# Patient Record
Sex: Female | Born: 1975 | Race: White | Hispanic: No | Marital: Single | Smoking: Never smoker
Health system: Southern US, Community
[De-identification: ages and names within clinical notes are randomized; demographics above are authoritative.]

## PROBLEM LIST (undated history)

## (undated) DIAGNOSIS — Z9989 Dependence on other enabling machines and devices: Secondary | ICD-10-CM

## (undated) DIAGNOSIS — R51 Headache: Secondary | ICD-10-CM

## (undated) DIAGNOSIS — B182 Chronic viral hepatitis C: Secondary | ICD-10-CM

## (undated) DIAGNOSIS — T7840XA Allergy, unspecified, initial encounter: Secondary | ICD-10-CM

## (undated) DIAGNOSIS — K589 Irritable bowel syndrome without diarrhea: Secondary | ICD-10-CM

## (undated) DIAGNOSIS — R519 Headache, unspecified: Secondary | ICD-10-CM

## (undated) DIAGNOSIS — F329 Major depressive disorder, single episode, unspecified: Secondary | ICD-10-CM

## (undated) DIAGNOSIS — F1020 Alcohol dependence, uncomplicated: Secondary | ICD-10-CM

## (undated) DIAGNOSIS — F419 Anxiety disorder, unspecified: Secondary | ICD-10-CM

## (undated) DIAGNOSIS — J45909 Unspecified asthma, uncomplicated: Secondary | ICD-10-CM

## (undated) DIAGNOSIS — G4733 Obstructive sleep apnea (adult) (pediatric): Secondary | ICD-10-CM

## (undated) DIAGNOSIS — R87619 Unspecified abnormal cytological findings in specimens from cervix uteri: Secondary | ICD-10-CM

## (undated) DIAGNOSIS — J302 Other seasonal allergic rhinitis: Secondary | ICD-10-CM

## (undated) DIAGNOSIS — IMO0002 Reserved for concepts with insufficient information to code with codable children: Secondary | ICD-10-CM

## (undated) DIAGNOSIS — F32A Depression, unspecified: Secondary | ICD-10-CM

## (undated) DIAGNOSIS — K921 Melena: Secondary | ICD-10-CM

## (undated) HISTORY — PX: TONSILLECTOMY: SUR1361

## (undated) HISTORY — DX: Chronic viral hepatitis C: B18.2

## (undated) HISTORY — DX: Melena: K92.1

## (undated) HISTORY — PX: DILATION AND CURETTAGE OF UTERUS: SHX78

## (undated) HISTORY — DX: Reserved for concepts with insufficient information to code with codable children: IMO0002

## (undated) HISTORY — DX: Obstructive sleep apnea (adult) (pediatric): G47.33

## (undated) HISTORY — PX: NASAL SEPTOPLASTY W/ TURBINOPLASTY: SHX2070

## (undated) HISTORY — DX: Allergy, unspecified, initial encounter: T78.40XA

## (undated) HISTORY — DX: Unspecified abnormal cytological findings in specimens from cervix uteri: R87.619

## (undated) HISTORY — DX: Headache: R51

## (undated) HISTORY — DX: Dependence on other enabling machines and devices: Z99.89

## (undated) HISTORY — DX: Alcohol dependence, uncomplicated: F10.20

## (undated) HISTORY — DX: Other seasonal allergic rhinitis: J30.2

## (undated) HISTORY — DX: Irritable bowel syndrome, unspecified: K58.9

## (undated) HISTORY — DX: Headache, unspecified: R51.9

---

## 1997-05-15 HISTORY — PX: NASAL FRACTURE SURGERY: SHX718

## 1998-12-28 ENCOUNTER — Emergency Department (HOSPITAL_COMMUNITY): Admission: EM | Admit: 1998-12-28 | Discharge: 1998-12-28 | Payer: Self-pay | Admitting: Emergency Medicine

## 1999-03-04 ENCOUNTER — Emergency Department (HOSPITAL_COMMUNITY): Admission: EM | Admit: 1999-03-04 | Discharge: 1999-03-04 | Payer: Self-pay | Admitting: Emergency Medicine

## 1999-03-04 ENCOUNTER — Encounter: Payer: Self-pay | Admitting: Emergency Medicine

## 2003-11-21 ENCOUNTER — Emergency Department (HOSPITAL_COMMUNITY): Admission: EM | Admit: 2003-11-21 | Discharge: 2003-11-21 | Payer: Self-pay | Admitting: Emergency Medicine

## 2004-07-14 ENCOUNTER — Ambulatory Visit: Payer: Self-pay | Admitting: Internal Medicine

## 2004-08-02 ENCOUNTER — Ambulatory Visit (HOSPITAL_COMMUNITY): Admission: RE | Admit: 2004-08-02 | Discharge: 2004-08-02 | Payer: Self-pay | Admitting: Internal Medicine

## 2004-08-18 ENCOUNTER — Ambulatory Visit: Payer: Self-pay | Admitting: Gastroenterology

## 2004-09-05 ENCOUNTER — Encounter (INDEPENDENT_AMBULATORY_CARE_PROVIDER_SITE_OTHER): Payer: Self-pay | Admitting: *Deleted

## 2004-09-05 ENCOUNTER — Ambulatory Visit (HOSPITAL_COMMUNITY): Admission: RE | Admit: 2004-09-05 | Discharge: 2004-09-05 | Payer: Self-pay | Admitting: Obstetrics and Gynecology

## 2004-12-23 ENCOUNTER — Ambulatory Visit: Payer: Self-pay | Admitting: Gastroenterology

## 2005-03-02 ENCOUNTER — Ambulatory Visit: Payer: Self-pay | Admitting: Gastroenterology

## 2005-03-09 ENCOUNTER — Ambulatory Visit: Payer: Self-pay | Admitting: Family Medicine

## 2005-03-28 ENCOUNTER — Other Ambulatory Visit: Admission: RE | Admit: 2005-03-28 | Discharge: 2005-03-28 | Payer: Self-pay | Admitting: Family Medicine

## 2005-03-28 ENCOUNTER — Encounter (INDEPENDENT_AMBULATORY_CARE_PROVIDER_SITE_OTHER): Payer: Self-pay | Admitting: Specialist

## 2005-03-28 ENCOUNTER — Ambulatory Visit: Payer: Self-pay | Admitting: Family Medicine

## 2005-04-04 ENCOUNTER — Ambulatory Visit: Payer: Self-pay | Admitting: Family Medicine

## 2005-05-03 ENCOUNTER — Emergency Department (HOSPITAL_COMMUNITY): Admission: EM | Admit: 2005-05-03 | Discharge: 2005-05-04 | Payer: Self-pay | Admitting: Emergency Medicine

## 2005-07-10 ENCOUNTER — Ambulatory Visit: Payer: Self-pay | Admitting: Family Medicine

## 2005-08-22 ENCOUNTER — Ambulatory Visit: Payer: Self-pay | Admitting: Family Medicine

## 2005-09-15 ENCOUNTER — Ambulatory Visit: Payer: Self-pay | Admitting: Gastroenterology

## 2006-01-25 ENCOUNTER — Ambulatory Visit: Payer: Self-pay | Admitting: Gastroenterology

## 2006-04-24 ENCOUNTER — Ambulatory Visit: Payer: Self-pay | Admitting: Gastroenterology

## 2006-05-15 HISTORY — PX: LEEP: SHX91

## 2006-08-02 ENCOUNTER — Ambulatory Visit: Payer: Self-pay | Admitting: Family Medicine

## 2006-10-19 DIAGNOSIS — J45909 Unspecified asthma, uncomplicated: Secondary | ICD-10-CM | POA: Insufficient documentation

## 2006-10-24 ENCOUNTER — Ambulatory Visit: Payer: Self-pay | Admitting: Family Medicine

## 2006-10-24 DIAGNOSIS — F3160 Bipolar disorder, current episode mixed, unspecified: Secondary | ICD-10-CM | POA: Insufficient documentation

## 2006-10-24 DIAGNOSIS — B182 Chronic viral hepatitis C: Secondary | ICD-10-CM | POA: Insufficient documentation

## 2006-10-24 DIAGNOSIS — Z9189 Other specified personal risk factors, not elsewhere classified: Secondary | ICD-10-CM | POA: Insufficient documentation

## 2006-10-24 DIAGNOSIS — O0380 Unspecified complication following complete or unspecified spontaneous abortion: Secondary | ICD-10-CM | POA: Insufficient documentation

## 2006-10-26 LAB — CONVERTED CEMR LAB
ALT: 205 units/L — ABNORMAL HIGH (ref 0–40)
Albumin: 3.3 g/dL — ABNORMAL LOW (ref 3.5–5.2)
Alkaline Phosphatase: 69 units/L (ref 39–117)
Basophils Absolute: 0 10*3/uL (ref 0.0–0.1)
Basophils Relative: 0.3 % (ref 0.0–1.0)
Chloride: 103 meq/L (ref 96–112)
Creatinine, Ser: 0.9 mg/dL (ref 0.4–1.2)
Eosinophils Absolute: 0.1 10*3/uL (ref 0.0–0.6)
GGT: 117 units/L — ABNORMAL HIGH (ref 7–51)
HCT: 37.9 % (ref 36.0–46.0)
HDL: 38.4 mg/dL — ABNORMAL LOW (ref >39.0)
Hemoglobin: 13.2 g/dL (ref 12.0–15.0)
Lymphocytes Relative: 44.7 % (ref 12.0–46.0)
Neutro Abs: 1.9 10*3/uL (ref 1.4–7.7)
Neutrophils Relative %: 43.6 % (ref 43.0–77.0)
Potassium: 3.8 meq/L (ref 3.5–5.1)
RDW: 12.8 % (ref 11.5–14.6)
Total Protein: 7.1 g/dL (ref 6.0–8.3)
Triglycerides: 116 mg/dL (ref 0–149)
VLDL: 23 mg/dL (ref 0–40)
WBC: 4.3 10*3/uL — ABNORMAL LOW (ref 4.5–10.5)

## 2006-11-13 ENCOUNTER — Ambulatory Visit: Payer: Self-pay | Admitting: Gastroenterology

## 2006-12-24 ENCOUNTER — Ambulatory Visit: Payer: Self-pay | Admitting: Family Medicine

## 2007-02-25 ENCOUNTER — Ambulatory Visit: Payer: Self-pay | Admitting: Internal Medicine

## 2007-03-05 ENCOUNTER — Encounter: Payer: Self-pay | Admitting: Internal Medicine

## 2007-03-05 ENCOUNTER — Encounter: Payer: Self-pay | Admitting: Family Medicine

## 2007-03-05 ENCOUNTER — Ambulatory Visit (HOSPITAL_COMMUNITY): Admission: RE | Admit: 2007-03-05 | Discharge: 2007-03-05 | Payer: Self-pay | Admitting: Internal Medicine

## 2007-03-18 ENCOUNTER — Ambulatory Visit: Payer: Self-pay | Admitting: Internal Medicine

## 2007-05-29 ENCOUNTER — Ambulatory Visit: Payer: Self-pay | Admitting: Internal Medicine

## 2007-06-06 ENCOUNTER — Ambulatory Visit: Payer: Self-pay | Admitting: Gastroenterology

## 2007-06-25 ENCOUNTER — Encounter: Payer: Self-pay | Admitting: Family Medicine

## 2007-07-23 DIAGNOSIS — F1021 Alcohol dependence, in remission: Secondary | ICD-10-CM

## 2007-07-23 DIAGNOSIS — F1921 Other psychoactive substance dependence, in remission: Secondary | ICD-10-CM | POA: Insufficient documentation

## 2007-07-23 DIAGNOSIS — K2940 Chronic atrophic gastritis without bleeding: Secondary | ICD-10-CM | POA: Insufficient documentation

## 2007-07-23 DIAGNOSIS — F329 Major depressive disorder, single episode, unspecified: Secondary | ICD-10-CM

## 2008-06-30 ENCOUNTER — Ambulatory Visit: Payer: Self-pay | Admitting: Gastroenterology

## 2010-03-11 ENCOUNTER — Encounter: Admission: RE | Admit: 2010-03-11 | Discharge: 2010-03-11 | Payer: Self-pay | Admitting: Obstetrics and Gynecology

## 2010-04-14 ENCOUNTER — Ambulatory Visit: Payer: Self-pay | Admitting: Gastroenterology

## 2010-04-27 ENCOUNTER — Encounter
Admission: RE | Admit: 2010-04-27 | Discharge: 2010-04-27 | Payer: Self-pay | Source: Home / Self Care | Attending: Obstetrics and Gynecology | Admitting: Obstetrics and Gynecology

## 2010-09-11 ENCOUNTER — Emergency Department (HOSPITAL_COMMUNITY)
Admission: EM | Admit: 2010-09-11 | Discharge: 2010-09-11 | Disposition: A | Discharge status: Home / Self Care | Payer: BC Managed Care – PPO | Attending: Emergency Medicine | Admitting: Emergency Medicine

## 2010-09-11 DIAGNOSIS — R42 Dizziness and giddiness: Secondary | ICD-10-CM | POA: Insufficient documentation

## 2010-09-11 DIAGNOSIS — J329 Chronic sinusitis, unspecified: Secondary | ICD-10-CM | POA: Insufficient documentation

## 2010-09-11 DIAGNOSIS — F319 Bipolar disorder, unspecified: Secondary | ICD-10-CM | POA: Insufficient documentation

## 2010-09-11 DIAGNOSIS — R51 Headache: Secondary | ICD-10-CM | POA: Insufficient documentation

## 2010-09-11 DIAGNOSIS — B1920 Unspecified viral hepatitis C without hepatic coma: Secondary | ICD-10-CM | POA: Insufficient documentation

## 2010-09-11 DIAGNOSIS — R112 Nausea with vomiting, unspecified: Secondary | ICD-10-CM | POA: Insufficient documentation

## 2010-09-11 LAB — URINALYSIS, ROUTINE W REFLEX MICROSCOPIC
Bilirubin Urine: NEGATIVE
Glucose, UA: NEGATIVE mg/dL
Hgb urine dipstick: NEGATIVE

## 2010-09-27 NOTE — Assessment & Plan Note (Signed)
[facility]                         GASTROENTEROLOGY OFFICE NOTE   Nicole [name], Nicole [name]                       MRN:          [id number]  DATE:02/25/2007                            DOB:          [birth year]    [name] is a very nice 35 year old white female who is here today  because of intermittent nausea and vomiting which has been going on for  11 months.   She describes vomiting in the morning or in the evening and at times  during the day.  She may vomit post prandially but also may just gag and  vomit just a small amount of liquid.  The vomiting does not necessarily  improve her symptoms.  In fact, she is usually sick even after she  vomits.  She has missed work only rarely.  She usually goes to work and  the vomiting does not keep her from interrupting her work.  She never  vomits at night.  There is no abdominal pain associated with it.  There  is no history of prior nausea and vomiting prior to 11 months ago.  There is no family history of similar problems.  But, there is a very  strong family history of gallbladder disease in her maternal grandmother  as well as in her mother who has gallstones.   Nicole [name] had an upper abdominal ultrasound in 2006, which showed a  completely normal gallbladder and common bile duct.   Nicole [name] denies any change in her bowel habits.  She has a history of  hepatitis C which was diagnosed on liver biopsy in April 2006.  She had  grade 1 fibrosis and grade 2 activity.  This was done after use of  intravenous drugs.  She went into recovery in 2003 and relapsed in  February 2007.  She went to rehab and has been clean for over a year.  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   Her weight has been stable.  She denies any rashes, fevers, chills.   MEDICATIONS:  1. Kariva birth control pills.  2. Abilify 10 mg p.o. q.h.s.  3. Wellbutrin 150 mg, one p.o. b.i.d. for  bipolar disorder, followed      by Dr. Evelene Croon.  She sees her every 6 months.    She goes also to the hepatitis clinic every 6 months.  She is not being  treated with interferon because she could not afford the medication.   PAST HISTORY:  Significant for:  1. Hepatitis C.  2. Depression.  3. Allergies.  4. She has also a history of alcoholism.   FAMILY HISTORY:  Positive for hepatitis C in the father who also has  alcoholism.   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  She does  not smoke.  She does not drink alcohol currently.   REVIEW OF SYSTEMS:  Positive for eye glasses.   PHYSICAL EXAMINATION:  VITAL SIGNS:  Blood pressure 98/52, weight 132  pounds, pulse 88.  GENERAL:  She was alert, oriented, no distress,  very quiet.  EYES:  Sclerae nonicteric.  EXTREMITIES:  There was palmar erythema on both hands.  There were no  spider nevi.  NECK:  Supple.  No adenopathy.  LUNGS:  Clear to auscultation.  COR:  Normal S1, normal S2.  ABDOMEN:  Soft with minimal tenderness right upper quadrant.  Her liver  edge was at the costal margin. Splenic __________  .  There was no  distention.  No ascites.  Abdomen was normal.  RECTAL:  Not done.  EXTREMITIES:  No edema.   IMPRESSION:  27. A 35 year old white female with active hepatitis C - not treated      with a grade 1 fibrosis and a grade 2 activity per liver biopsy in      April 2006.  2. Nausea and vomiting twice a week, rule out gastroparesis, rule out      gastritis, rule out medication side effects or possibly functional      nausea and vomiting.  3. History of intravenous drug abuse with relapse in 2007, currently      in remission.  4. Positive family history of gallbladder disease in mother and      grandmother.   PLAN:  1. Upper endoscopy scheduled.  2. Trial of Reglan 5 mg p.o. b.i.d.  3. Pepcid 4 mg q.h.s.  4. Communicate with Dr. Evelene Croon concerning the patient's symptoms and      their possible  relationship to her bipolar disorder.  5. We may also consider a gastric emptying scan or increasing her      Reglan to 10 mg at a time.     Hedwig Morton. Juanda Chance, MD  Electronically Signed    DMB/MedQ  DD: 02/25/2007  DT: 02/25/2007  Job #: [id number]   cc:   Lelon Perla, DO  Milagros Evener, M.D.

## 2010-09-27 NOTE — Assessment & Plan Note (Signed)
[facility]                         GASTROENTEROLOGY OFFICE NOTE   [name], [name]                       MRN:          [id number]  DATE:05/29/2007                            DOB:          December 01, 1975    Nicole [name] is a 35 year old white female with bipolar disorder who we saw for  evaluation of nausea and vomiting.  Upper endoscopy on March 05, 2007  was essentially normal except for mild chronic gastritis.  She was  treated with Pepcid, but discontinued it because of no effect.  She also  tried metoclopramide 5 mg twice a day which seemed to have helped, but  she also discontinued it.  We have attributed her nausea to Abilify  medication 10 mg at bedtime.  The dose was adjusted, but she started  developing symptoms of bipolar disorder, and the dose had to be  increased again.  While she reduced the dose of her Abilify, her nausea  markedly improved.  Overall, her nausea has improved to the point where  she has it only about once every 2 weeks.  She takes Phenergan 12.5 mg  p.r.n. nausea which is quite effective and she is satisfied with the  results and effects of the Phenergan.  Another separate problem has been  hepatitis C.  She had a liver biopsy in 2006.  We have not been asked to  follow this problem.   Today we discussed treatment of her nausea related to the medication,  but it is not bothersome to her.  She needs to continue taking her  Abilify to control the symptoms of bipolar disorder.  She used to be  followed by Dr. Evelene Croon.   PLAN:  1. Take Reglan 5 mg b.i.d. p.r.n.  2. Minimize use of ibuprofen which she takes for headaches.  3. Take Gas-X or probiotics for gas.  4. Refills of Phenergan in case she needs it.  Currently, she has      enough to last her for a while.  I will see her on a p.r.n. basis.     Nicole [name]. Juanda Chance, MD  Electronically Signed    DMB/MedQ  DD: 05/29/2007  DT: 05/29/2007  Job #: [id number]   cc:   Lelon Perla,  DO

## 2010-09-29 ENCOUNTER — Ambulatory Visit: Payer: BC Managed Care – PPO | Admitting: Gastroenterology

## 2010-09-29 ENCOUNTER — Encounter: Payer: Self-pay | Admitting: Gastroenterology

## 2010-09-29 ENCOUNTER — Ambulatory Visit (INDEPENDENT_AMBULATORY_CARE_PROVIDER_SITE_OTHER): Payer: BC Managed Care – PPO | Admitting: Gastroenterology

## 2010-09-29 VITALS — BP 102/73 | HR 86 | Temp 98.4°F | Ht 61.0 in | Wt 157.0 lb

## 2010-09-29 DIAGNOSIS — B182 Chronic viral hepatitis C: Secondary | ICD-10-CM

## 2010-09-29 NOTE — Progress Notes (Signed)
Reason for visit:  Followup with genotype 1a HCV.  History:  [name] returns then accompanied. She has genotype 1a HCV with a previous biopsy on 09/05/2004 showing grade 2, stage I disease. She has not been treated because of stability disease and minor fibrosis. After last being seen by me on 04/14/2010, she was screened for [id number], but failed because of her psychiatric history.   Currently [name] has no symptoms referable to her history of liver disease. There are no symptoms to suggest cryoglobulinemia or decompensated liver disease.  Past medical history:  No interval change. In terms of her bipolar affective disorder [name] sees Dr. Evelene Croon every 6 months. She does not have an ongoing relationship with a therapist between those visits. When asked about this., [name] reports that Dr. Evelene Croon did not think a therapist was necessary.  Medications:  Outpatient Encounter Prescriptions as of 09/29/2010  Medication Sig Dispense Refill  . albuterol (PROVENTIL) (2.5 MG/3ML) 0.083% nebulizer solution Take by nebulization as needed.        . ARIPiprazole (ABILIFY) 15 MG tablet Take 15 mg by mouth daily.        Marland Kitchen buPROPion (WELLBUTRIN SR) 150 MG 12 hr tablet Take 150 mg by mouth 2 (two) times daily.        . Desogestrel-Ethinyl Estradiol (KARIVA PO) Take 1 tablet by mouth daily.        . fluticasone (FLONASE) 50 MCG/ACT nasal spray 2 sprays by Nasal route 2 (two) times daily as needed.        Marland Kitchen ibuprofen (ADVIL,MOTRIN) 100 MG tablet Take 100 mg by mouth as needed.         Allergies  Allergen Reactions  . Cefaclor Rash   Smoking: Quit 2007  Alcohol: Denies interval consumption.  Review of systems: [name] reports a recent episode of sinus headaches for which she needed to attend her emergency room local emergency room for relief. Otherwise all consistent reviewed today with [name] a negative other which is mentioned above. Her CES D. was 5.  Physical  examination:  Constitutional: Appeared well.  Vital Signs: BP 102/73  Pulse 86  Temp 98.4 F (36.9 C)  Ht 5\' 1"  (1.549 m)  Wt 157 lb (71.215 kg)  BMI 29.67 kg/m2  Prior lab work from her screening visit on 08/17/2010:  Her IL28 B was CC. Her hepatitis B surface antigen was negative. HIV was negative. A drug screen was negative. TSH 2.86.  Hemoglobin A1c 5.3%.  AST and ALT could not be determined because of hemolysis. ALP was 44. Total bilirubin 0.5.  Creatinine 1.12. Albumin 3.7. Globulins 4.1. CBC white count 6.44 hemoglobin 14.4 platelets 248. Beta hCG negative. HCVRNA 6430000 international units per mL.  Genotype 1a.  Assessment.  [name] is a 35 year old with genotype 1A HCV genotype IL28B CC and biopsy on 09/05/2004 showing grade 2, stage I disease. She has not been treated. As previously I think she is a good candidate for treatment despite her bipolar disorder. She demonstrates excellent insight into her condition. She reports a stable relationship with her psychiatrist.  In my discussion with [name] we discussed the possibility waiting for another trial to open up or the availability of future medications versus starting on treatment with a combination of pegylated interferon, ribavirin, and a protease inhibitor  now. I reviewed the specific system, constitutional, and psychiatric side effects treatment. We discussed the treatment protocol for clinic. If she is to start treatment I would arrange  for medication to be shipped to her and she was then to contact us for a followup appointment in clinic where she will shown how to self-administer her meds and a treatment regimen will be arranged. Having discussed this with [name], she would like to start on treatment.  Plan:  #1 I will complete the necessary paperwork for a prescription for Pegasys 180 mcg weekly, ribavirin 1000 mg by mouth daily, and to severe 750 mg 3 times a day.  #2 Has received appetite A and B  vaccination previously.  #3 Her followup will be determined based on when she receives medications and comes in for teaching.

## 2010-11-10 ENCOUNTER — Other Ambulatory Visit: Payer: Self-pay | Admitting: Gastroenterology

## 2010-11-10 ENCOUNTER — Ambulatory Visit (INDEPENDENT_AMBULATORY_CARE_PROVIDER_SITE_OTHER): Payer: BC Managed Care – PPO | Admitting: Gastroenterology

## 2010-11-10 VITALS — BP 110/78 | HR 82 | Temp 98.4°F | Ht 61.0 in | Wt 158.0 lb

## 2010-11-10 DIAGNOSIS — B182 Chronic viral hepatitis C: Secondary | ICD-10-CM

## 2010-11-11 LAB — HEPATITIS C RNA QUANTITATIVE: HCV Quantitative: 2870000 IU/mL — ABNORMAL HIGH (ref <43)

## 2010-11-17 NOTE — Progress Notes (Signed)
NAME:  Nicole [name], Nicole [name]  MR#:  [id number]      DATE:  11/10/2010  DOB:  02-18-1976    cc: Consulting Physician:  Elmon Else, MD, [facility], [address] [place], [address], [place], [place] [address], Primary care physician:  [name], MD, [facility], [address], [place], [place] [address], Phone [phone] Referring Physician:  Dorena Cookey, MD, [facility], [address], [address], [place], [place] [address], Fax [phone] Milagros Evener, MD [facility], [address] [address], [place], [place] [address], Phone [phone]    REASON FOR VISIT:  Followup of genotype 1a hepatitis C, IL 28B genotype CC.   History:  The patient returns today unaccompanied. She has no symptoms currently referable to her history of hepatitis C. There are no symptoms to suggest cryoglobulin mediated or decompensated liver disease. She  is most interested in starting on therapy. I have gone through the process of prior authorization for her medications. [facility], the specialty pharmacy that will be supplying her drugs would not release  drug until there is a negative urine pregnancy test, and a teaching visit for which she also comes in today.   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 She also has a history of bipolar affective disorder, which is very stable under the care of Dr. Evelene Croon.    CURRENT MEDICATIONS:  Abilify 15 mg p.o. at bedtime, Wellbutrin 300 mg p.o. daily, Kariva once daily, albuterol inhaler p.r.n., Flonase 2 inhalations daily, and ibuprofen p.r.n.   ALLERGIES:  Ceclor causes a rash.    habits:  Smoking, quit over 5 years ago. Alcohol denies interval consumption.   REVIEW OF SYSTEMS:  All 10 systems reviewed today with the patient and they are negative other than which was mentioned above. His CES-D was 7.   PHYSICAL EXAMINATION:    Constitutional: Well appearing. Vital signs: Height 61 inches, weight 158 pounds, blood pressure 110/78, pulse of 82, temperature 98.4 Fahrenheit.   Laboratories:  Urine pregnancy test on 09/12/2010, was negative.   ASSESSMENT:  The patient is a 35 year old woman with a history genotype 1a HCV, IL 28B CC, liver biopsy on 09/05/2000, showing grade 2 stage 1 disease who is naive to treatment because of previous concerns of her bipolar  affective disorder. However it appears these issues have been resolved, and so she is brought in today for teaching.  Today, I have explained to her how to use the Pegasys preloaded syringes including if she requires a dose modification. We also reviewed the telaprevir booklet with all instructions and discussion  of side effects, as well. The patient's questions were answered. I have also taken this opportunity to explain to her that she needs to engage in 2 forms of contraception for duration of treatment and for 6  months thereafter because of the teratogenicity of the medications. She indicates that she has discussed with her gynecologist as to  whether use an IUD or 2 methods of contraception with her significant other and will resolve this soon because she knows she cannot become pregnant on therapy and for six months thereafter.   plan:  1. The patient will have a urine pregnancy test today. 2. Will do a viral load today. 3. These results will be sent to [facility] thereafter will release medications if the results are appropriate.  4. The patient knows to contact us once she receives medications to  give [place] notification as to when she needs her next appointment, which will be within 2 weeks of starting on treatment. 5. Previously hepatitis A and B immune. 6. I filled out her FMLA form to account for absence from work for clinic visit appointments, which she knows are very important. 7. I have told her that once she is on therapy, and has her first 2 week  follow up visit on treatment, we will need t do weekly CBC through [facility].             Brooke Dare, MD   ADDENDUM Urine pregnancy negative.  Viral load  2 870 000 IU/mL.  403 .[id number]  D:  Thu Jun 28 17:49:23 2012 ; T:  Thu Jun 28 20:55:36 2012  Job #:  [id number]

## 2010-12-08 ENCOUNTER — Telehealth: Payer: Self-pay | Admitting: Gastroenterology

## 2010-12-08 NOTE — Telephone Encounter (Signed)
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  Her start date for treatment was 12/02/10.  1) In terms of her concern that the symptoms are caused by unabsorbed telaprevir and if she is not absorbing it all, the lack of a good blood level would compromise her chance at an SVR, I reassured her that this would have a negligible effect on treatment outcomes.  She also is adhering to the pre dose fat meal, which I reassured her will help improve the bioavailability.                                                                                                                                                                                                                                                                                                                                                                                                                                                                                                                      2) In terms of her  concern about symptom relief, the [facility] pharmacist gave her a handout on symptom management for the proctalgia, Nicole [name] is starting to follow the instructions with some improvement.  She was reassured and will return next week for her 2 week on therapy appt.

## 2010-12-15 ENCOUNTER — Ambulatory Visit (INDEPENDENT_AMBULATORY_CARE_PROVIDER_SITE_OTHER): Payer: BC Managed Care – PPO | Admitting: Gastroenterology

## 2010-12-15 ENCOUNTER — Other Ambulatory Visit: Payer: Self-pay | Admitting: Gastroenterology

## 2010-12-15 DIAGNOSIS — B182 Chronic viral hepatitis C: Secondary | ICD-10-CM

## 2010-12-15 LAB — CBC WITH DIFFERENTIAL/PLATELET
Eosinophils Absolute: 0 10*3/uL (ref 0.0–0.7)
HCT: 41.2 % (ref 36.0–46.0)
Hemoglobin: 13.7 g/dL (ref 12.0–15.0)
Lymphocytes Relative: 58 % — ABNORMAL HIGH (ref 12–46)
MCH: 31.7 pg (ref 26.0–34.0)
MCHC: 33.3 g/dL (ref 30.0–36.0)
Neutrophils Relative %: 30 % — ABNORMAL LOW (ref 43–77)
Platelets: 159 10*3/uL (ref 150–400)
RBC: 4.32 MIL/uL (ref 3.87–5.11)
RDW: 14.4 % (ref 11.5–15.5)

## 2010-12-22 ENCOUNTER — Other Ambulatory Visit: Payer: Self-pay | Admitting: Gastroenterology

## 2010-12-22 NOTE — Progress Notes (Signed)
NAME:  Nicole [name], Nicole [name]  MR#:  [id number]      DATE:  12/15/2010  DOB:  March 23, 1976    cc: Consulting Physician:  Elmon Else, MD, [facility], [address] [place], [address], [place], [place] [address], Primary Care Physician:  [name], MD, [facility], [address], [address], [place], [place] [address], Phone [phone] Referring Physician:  Veva Holes, MD, [facility], [address], [place], [place] [address], Fax [phone] Milagros Evener, MD, [facility], [address] [address], [place], [place] [address], Phone [phone]    REASON FOR VISIT:  Followup genotype 1a hepatitis C, IL 28B genotype CC, week 2 of therapy.   history:  The patient returns today unaccompanied. She is a 35 year old woman with history of genotype 1a hepatitis C, IL 28B CC, with a biopsy on 09/05/2000 showing grade 2 stage I disease who is naive to treatment.  Her start date for therapy was 12/02/2010. She is due for her third injection Pegasys tomorrow. The patient is on a combination of Pegasys, ribavirin, and telaprevir. Since starting therapy, she has  some myalgias and chills especially after injections but it is not that significant for her. In terms of her history of bipolar disorder, she denies any depressive symptoms. In fact, she feels better now that  she is on treatment than before. In addition, she has fatigue. There is no skin rash. 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 The patient is using condoms and spermicidal foam as contraception.   PAST MEDICAL HISTORY:  There is no other interval change.   CURRENT MEDICATIONS:  Abilify 15 mg p.o. at bedtime, Wellbutrin 300 mg p.o. daily, Kariva once daily, albuterol inhaler p.r.n., ibuprofen p.r.n., Pegasys 180  mcg subcu weekly on Fridays, ribavirin 600 mg p.o. b.i.d., telaprevir 750 mg p.o. q. 8 hours.    ALLERGIES:  Ceclor causes a rash.   HABITS:  Smoking, quit over 5 years ago. Alcohol, denies interval consumption.   REVIEW OF SYSTEMS:  All 10 systems reviewed today with the patient and they are negative other than which is mentioned above. CES-D was 9.   PHYSICAL EXAMINATION:   Constitutional:  Well appearing with some degree of central obesity. Vital signs: Height 61 inches, weight 160 pounds, blood pressure 131/89, pulse 97, temperature 98.2 Fahrenheit.   LABORATORY WORK:  Today is pending.   ASSESSMENT:  The patient is a 35 year old woman with history genotype 1a hepatitis C, IL 28B CC, liver biopsy 09/05/2000 showing grade 2 stage I disease. She is naive to therapy and started on Pegasys, ribavirin, and  telaprevir on 12/02/2010 putting her at week 2 of therapy. She is tolerating therapy well with the usual side effects of interferon and telaprevir.  Today we discussed side effect management. We also discussed starting a weekly CBC starting today. I have given her a standing order for CBC  with differential to be done on weeks that she is not in clinic to see me. We discussed the viral load milestones and their significance.   plan:  1. CBC with differential today. 2. Hepatitis A and B immune. 3. She is using 2 methods of contraception. 4. I have given her a standing order for CBC with differential on a weekly basis, which she will start next week on Thursdays. 5. She will return in 2 weeks' time for her week 4 visit at which time  will do her first HCV RNA. 6. She knows to contact [facility] for renewals of medications before she runs out.            Brooke Dare, MD   [phone]  D:  Thu Aug 02 13:01:34 2012 ; T:  Thu Aug 02 13:34:59 2012  Job #:  [id number]

## 2010-12-23 LAB — CBC WITH DIFFERENTIAL/PLATELET
Basophils Absolute: 0 10*3/uL (ref 0.0–0.1)
Eosinophils Absolute: 0 10*3/uL (ref 0.0–0.7)
Eosinophils Relative: 0 % (ref 0–5)
HCT: 39.1 % (ref 36.0–46.0)
Hemoglobin: 12.6 g/dL (ref 12.0–15.0)
MCHC: 32.2 g/dL (ref 30.0–36.0)
MCV: 96.5 fL (ref 78.0–100.0)
Monocytes Absolute: 0.4 10*3/uL (ref 0.1–1.0)
Neutro Abs: 1 10*3/uL — ABNORMAL LOW (ref 1.7–7.7)
WBC: 4.2 10*3/uL (ref 4.0–10.5)

## 2010-12-29 ENCOUNTER — Other Ambulatory Visit: Payer: Self-pay | Admitting: Gastroenterology

## 2010-12-29 ENCOUNTER — Ambulatory Visit (INDEPENDENT_AMBULATORY_CARE_PROVIDER_SITE_OTHER): Payer: BC Managed Care – PPO | Admitting: Gastroenterology

## 2010-12-29 VITALS — BP 118/89 | HR 96 | Temp 97.8°F | Ht 61.0 in | Wt 161.0 lb

## 2010-12-29 DIAGNOSIS — B182 Chronic viral hepatitis C: Secondary | ICD-10-CM

## 2010-12-30 LAB — CBC WITH DIFFERENTIAL/PLATELET
Eosinophils Relative: 0 % (ref 0–5)
MCH: 30.9 pg (ref 26.0–34.0)
MCHC: 31 g/dL (ref 30.0–36.0)
Monocytes Absolute: 0.3 10*3/uL (ref 0.1–1.0)
Neutro Abs: 1.6 10*3/uL — ABNORMAL LOW (ref 1.7–7.7)
Neutrophils Relative %: 39 % — ABNORMAL LOW (ref 43–77)
WBC: 4.1 10*3/uL (ref 4.0–10.5)

## 2011-01-02 LAB — HEPATITIS C RNA QUANTITATIVE

## 2011-01-05 ENCOUNTER — Other Ambulatory Visit: Payer: Self-pay | Admitting: Gastroenterology

## 2011-01-05 NOTE — Progress Notes (Signed)
NAME:  Nimrat, Woolworth  MR#:  [id number]      DATE:  12/29/2010  DOB:  11-18-1975    cc: Vinetta Bergamo. Richardson Dopp, PhD, [address], [address], [place], [place] [address], Phone [phone] Consulting Physician:  Elmon Else, MD, [facility], [address], [address], [place], [place] [address] Primary Care Physician:  [name], MD, [facility], [address], [address], [place], [place] [address],  Phone [phone] Referring Physician:  Dorena Cookey, MD, [facility], [address], [place], [place] [address], Fax [phone] Milagros Evener, MD, [facility], [address] [address], [place], [place] [address], Phone [phone]    REASON FOR VISIT:  Followup genotype 1a hepatitis C, IL 28B genotype CC, week 4 of therapy.   History:  The patient returns today unaccompanied. She has complained of significant fatigue. In addition, she noticed that she has become amenorrheic. Otherwise, she denies any significant side effects of  therapy, nor are there symptoms to suggest decompensated liver disease.  It should be noted she has a history of bipolar affective disorder. She continues to followup with Dr. Evelene Croon for medication management. In addition, she is now seeing a Evalee Jefferson a psychologist in  [place]. She has seen her 3 times and is due to see her biweekly. Overall, the patient is feeling well and would like to continue with treatment.   PAST MEDICAL HISTORY:  There is no other interval change.   CURRENT MEDICATIONS:  Abilify 15 mg p.o. at bedtime, Wellbutrin 50 mg p.o. once daily, Kariva once daily, albuterol inhaler p.r.n., ibuprofen p.r.n., Pegasys  180 mcg subcu weekly, Fridays, ribavirin 600 mg p.o. b.i.d., telaprevir 750 mg p.o. every 8 hours.   ALLERGIES:  Ceclor causes a rash.   HABITS:  Smoking quit over 5 years ago. Alcohol denies interval consumption.   REVIEW OF SYSTEMS:  All  10 systems reviewed today with the patient and they are negative other than which was mentioned above. CES-D was 13.   Physical examination:   Constitutional:  Well appearing with some element of central obesity.   Vital signs:  Height is 61 inches, weight 161 pounds, unchanged from previously. Blood pressure 118/89, pulse of 96, temperature 97 Fahrenheit.    Laboratories:  Last week CBC, white count 4.2, hemoglobin 12.6, MCV 96.5, platelet count 162, ANC was 1.0.   ASSESSMENT:  The patient is a 35 year old woman with history of genotype 1a hepatitis C, IL 28B CC, liver biopsy 09/05/2000, showing grade 2 stage I disease. She started Pegasys, ribavirin and telaprevir on  12/02/2010, putting her at approximately week 4 of therapy. She takes her week 5 injection tomorrow. She is tolerating therapy with the usual side effects of interferon and telaprevir. It is likely that her  amenorrhea is secondary to the effects of interferon.  Today, we discussed her course to date. I have explained the significance of drawing her HCV RNA today. Once again, I warned her about the risk of teratogenicity from getting pregnant while on  therapy. We discussed the importance of continued follow up with her psychologist regarding assistance in the management of her bipolar affective disorder while on therapy.   Plan:  1. CBC with differential today. 2. HCV RNA today. 3. Will send a beta hCG today. 4. Hepatitis A and B immune. 5. She uses 2 methods of contraception, but is also is currently sexually abstinent. 6. She will return in 2 weeks' time with a  weekly CBC in the interim.            Brooke Dare, MD   ADDENDUM:  HCV RNA undetectable at week 4.  403 .[id number]  D:  Thu Aug 16 14:11:40 2012 ; T:  Thu Aug 16 15:16:37 2012  Job #:  [id number]

## 2011-01-06 LAB — CBC WITH DIFFERENTIAL/PLATELET
Basophils Relative: 0 % (ref 0–1)
Eosinophils Absolute: 0 10*3/uL (ref 0.0–0.7)
HCT: 36.4 % (ref 36.0–46.0)
Hemoglobin: 11.4 g/dL — ABNORMAL LOW (ref 12.0–15.0)
Monocytes Absolute: 0.4 10*3/uL (ref 0.1–1.0)
Monocytes Relative: 12 % (ref 3–12)
Neutro Abs: 0.9 10*3/uL — ABNORMAL LOW (ref 1.7–7.7)
Platelets: 144 10*3/uL — ABNORMAL LOW (ref 150–400)
RBC: 3.66 MIL/uL — ABNORMAL LOW (ref 3.87–5.11)
RDW: 17.7 % — ABNORMAL HIGH (ref 11.5–15.5)
WBC: 3.7 10*3/uL — ABNORMAL LOW (ref 4.0–10.5)

## 2011-01-12 ENCOUNTER — Ambulatory Visit (INDEPENDENT_AMBULATORY_CARE_PROVIDER_SITE_OTHER): Payer: BC Managed Care – PPO | Admitting: Gastroenterology

## 2011-01-12 VITALS — BP 121/89 | HR 102 | Temp 98.4°F | Ht 61.0 in | Wt 165.0 lb

## 2011-01-12 DIAGNOSIS — B182 Chronic viral hepatitis C: Secondary | ICD-10-CM

## 2011-01-13 ENCOUNTER — Other Ambulatory Visit: Payer: Self-pay | Admitting: Gastroenterology

## 2011-01-14 LAB — CBC WITH DIFFERENTIAL/PLATELET
Basophils Absolute: 0 10*3/uL (ref 0.0–0.1)
Eosinophils Absolute: 0 10*3/uL (ref 0.0–0.7)
Eosinophils Relative: 0 % (ref 0–5)
HCT: 34.7 % — ABNORMAL LOW (ref 36.0–46.0)
MCH: 32 pg (ref 26.0–34.0)
MCV: 102.7 fL — ABNORMAL HIGH (ref 78.0–100.0)
Monocytes Relative: 9 % (ref 3–12)
RBC: 3.38 MIL/uL — ABNORMAL LOW (ref 3.87–5.11)

## 2011-01-20 ENCOUNTER — Other Ambulatory Visit: Payer: Self-pay | Admitting: Gastroenterology

## 2011-01-21 LAB — CBC WITH DIFFERENTIAL/PLATELET
Eosinophils Relative: 0 % (ref 0–5)
Lymphocytes Relative: 61 % — ABNORMAL HIGH (ref 12–46)
Lymphs Abs: 2 10*3/uL (ref 0.7–4.0)
MCHC: 30.2 g/dL (ref 30.0–36.0)
MCV: 106.2 fL — ABNORMAL HIGH (ref 78.0–100.0)
Monocytes Absolute: 0.3 10*3/uL (ref 0.1–1.0)
Monocytes Relative: 10 % (ref 3–12)
Neutro Abs: 1 10*3/uL — ABNORMAL LOW (ref 1.7–7.7)
RBC: 3.4 MIL/uL — ABNORMAL LOW (ref 3.87–5.11)
WBC: 3.3 10*3/uL — ABNORMAL LOW (ref 4.0–10.5)

## 2011-01-26 ENCOUNTER — Ambulatory Visit: Payer: BC Managed Care – PPO | Admitting: Gastroenterology

## 2011-01-26 NOTE — Progress Notes (Signed)
NAME:  Nicole [name], Nicole [name]  MR#:  [id number]      DATE:  01/12/2011  DOB:  [birth year]    cc:  Vinetta Bergamo. Richardson Dopp, PhD, [address], [address], [place], [place] [address], Phone [phone]   Consulting Physician: Elmon Else, MD, [facility], [address], [address], [place], [place] [address]   Primary Care Physician: [name], MD, [facility], [address], [place], [place] [address], Phone [phone]   Referring Physician: Dorena Cookey, MD, [facility], [address], [address], [place], [place] [address], Fax [phone]   Milagros Evener, MD, [facility], [address] [address], [place], [place] [address], Phone [phone]     REASON FOR VISIT:  Follow genotype 1a hepatitis C, IL 28B CC, week 6 of therapy.    History:  The patient returns today unaccompanied. She is complaining of significant fatigue as before, but now feels some postural lightheadedness, as well.  Otherwise, there are no significant side  effects to treatment. Psychiatrically, she feels that she is doing well. She continues to follow up with her counselor, Dr. Richardson Dopp,  biweekly. She notes she should make an appointment with Dr. Evelene Croon, in follow up, as well.   PAST MEDICAL HISTORY:  No interval change.   CURRENT MEDICATIONS:  Abilify 15 mg p.o. at bedtime, Wellbutrin 50 mg p.o. daily, Kariva once daily, albuterol inhaler p.r.n., ibuprofen p.r.n., Pegasys 180  mcg subcu weekly on Fridays, ribavirin 600 mg p.o. b.i.d., telaprevir 750 mg p.o. every 8 hours.   ALLERGIES:  Ceclor causes a rash.    Habits:  Smoking, quit over 5 years ago. Alcohol denies interval consumption.   REVIEW OF SYSTEMS:  All 10 systems reviewed today with the patient and they are negative other than, which is mentioned above. His CES-D was 12.   PHYSICAL EXAMINATION:  Constitutional: Some pallor. Vital signs: Height 61 inches, weight 162  pounds, blood pressure 121/89, pulse 102, temperature 98.4 Fahrenheit.    LABORATORY WORK:  On 01/05/2011; Hemoglobin 11.4, white count 3.7, ANC 0.9, platelet count 144. Her week 4 HCV RNA, 2 weeks ago, on 12/29/2010, was undetectable.   ASSESSMENT:  The patient is a 35 year old with a history of genotype 1 a, hepatitis C, IL 28B CC, liver biopsy 09/05/2004, showing grade 2, stage I disease. He started Pegasys and ribavirin, and telaprevir on  12/02/2010, putting him at week 6 of therapy. Week 4 HCV RNA was undetectable. She is tolerating therapy with the usually side effects, particularly that of telaprevir induced anemia. She does not need  supplementation with erythropoietin at this time. There is some emerging data that erythropoietin may not be necessary but dose reduction to ribavirin to 600 mg daily without compromising the SVR  may be possible.  In my discussion today with the patient, I reviewed the side effects that she is currently experiencing particularly that of the fatigue and postural lightheadedness, which is likely due to anemia. I have  explained to her that it is likely that at some point that she is likely to become more anemic on therapy. We discussed the use of erythropoietin including the dosing protocol and the side effects,  which are, but not limited to, aplastic anemia, and thrombocytosis with stroke. We discussed the need therefore for weekly CBCs, that she is doing. We also discussed the alternative of dose reduction of the  ribavirin to 600 mg p.o. daily, without compromise to the SVR, which she would  prefer to do.  I have discussed the results of week 4 HCV RNA with her and she was pleased.   Plan:  1. CBC with differential today. 2. If hemoglobin is falling below 10, will do dose reduction of ribavirin to 600 mg p.o. daily rather than starting erythropoietin at this time. 3. Continue weekly CBCs. 4. Hepatitis A and B immune. 5. Return to clinic in 2 weeks' time  for week 8 visit.            Brooke Dare, MD   [facility] 01/13/11  HgB  10.8 so will not dose reduce yet.  403 .[id number]  D:  Thu Aug 30

## 2011-02-04 ENCOUNTER — Other Ambulatory Visit: Payer: Self-pay | Admitting: Gastroenterology

## 2011-02-04 LAB — CBC WITH DIFFERENTIAL/PLATELET
Basophils Absolute: 0 10*3/uL (ref 0.0–0.1)
Eosinophils Relative: 0 % (ref 0–5)
Lymphocytes Relative: 52 % — ABNORMAL HIGH (ref 12–46)
Lymphs Abs: 1.4 10*3/uL (ref 0.7–4.0)
MCH: 33.1 pg (ref 26.0–34.0)
Monocytes Relative: 10 % (ref 3–12)
Neutro Abs: 1.1 10*3/uL — ABNORMAL LOW (ref 1.7–7.7)
Neutrophils Relative %: 39 % — ABNORMAL LOW (ref 43–77)
Platelets: 108 10*3/uL — ABNORMAL LOW (ref 150–400)

## 2011-02-23 ENCOUNTER — Ambulatory Visit (INDEPENDENT_AMBULATORY_CARE_PROVIDER_SITE_OTHER): Payer: BC Managed Care – PPO | Admitting: Gastroenterology

## 2011-02-23 ENCOUNTER — Other Ambulatory Visit: Payer: Self-pay | Admitting: Gastroenterology

## 2011-02-23 DIAGNOSIS — B182 Chronic viral hepatitis C: Secondary | ICD-10-CM

## 2011-02-24 LAB — HEPATIC FUNCTION PANEL
Albumin: 3.9 g/dL (ref 3.5–5.2)
Indirect Bilirubin: 0.9 mg/dL (ref 0.0–0.9)
Total Protein: 6.4 g/dL (ref 6.0–8.3)

## 2011-02-24 LAB — CBC WITH DIFFERENTIAL/PLATELET
Basophils Relative: 0 % (ref 0–1)
Hemoglobin: 10.8 g/dL — ABNORMAL LOW (ref 12.0–15.0)
Lymphs Abs: 1.4 10*3/uL (ref 0.7–4.0)
MCH: 32.8 pg (ref 26.0–34.0)
Platelets: 102 10*3/uL — ABNORMAL LOW (ref 150–400)
RBC: 3.29 MIL/uL — ABNORMAL LOW (ref 3.87–5.11)
RDW: 16.8 % — ABNORMAL HIGH (ref 11.5–15.5)

## 2011-02-27 LAB — HEPATITIS C RNA QUANTITATIVE

## 2011-03-02 ENCOUNTER — Other Ambulatory Visit: Payer: Self-pay | Admitting: Gastroenterology

## 2011-03-02 LAB — CBC WITH DIFFERENTIAL/PLATELET
HCT: 35.2 % — ABNORMAL LOW (ref 36.0–46.0)
Lymphocytes Relative: 48 % — ABNORMAL HIGH (ref 12–46)
Lymphs Abs: 2 10*3/uL (ref 0.7–4.0)
Monocytes Absolute: 0.4 10*3/uL (ref 0.1–1.0)
Monocytes Relative: 9 % (ref 3–12)
Neutro Abs: 1.8 10*3/uL (ref 1.7–7.7)
Platelets: 117 10*3/uL — ABNORMAL LOW (ref 150–400)
RDW: 16.7 % — ABNORMAL HIGH (ref 11.5–15.5)

## 2011-03-03 ENCOUNTER — Encounter: Payer: Self-pay | Admitting: Gastroenterology

## 2011-03-03 NOTE — Progress Notes (Signed)
NAME:  Nicole [name], SALSBERRY  MR#:  [id number]      DATE:  02/23/2011  DOB:  [birth year]    cc: Vinetta Bergamo. Richardson Dopp, PhD, [address], [place], [place] [address], Phone [phone]   Consulting Physician: Elmon Else, MD, [facility], [address], [place], [place] [address]   Primary Care Physician: [name], MD, [facility], [address], [address], [place], [place] [address], Phone [phone]   Referring Physician: Dorena Cookey, MD, [facility], [address], [address], [place], [place] [address], Fax [phone]   Milagros Evener, MD, [facility], [address] [address], [place], [place] [address], Phone [phone]     REASON FOR VISIT:  Follow up of genotype 1a hepatitis C, IL 28 B CC, week 12 of therapy.   History:  The patient returns today unaccompanied. She was unable to attend the last clinic appointment because of significant work obligations. However, she is doing quite well on therapy. She is looking forward to  discontinuation of telaprevir in the coming week, tomorrow is her twelfth injection of interferon. There are no significant psychiatric side effects. She is not sexually active at this time.   PAST MEDICAL HISTORY:  No interval change.   CURRENT MEDICATIONS:  Abilify 15 mg p.o. at bedtime, Wellbutrin 50 mg p.o. daily, Kariva once daily, albuterol inhaler p.r.n., ibuprofen p.r.n., Pegasys 180  mcg subcu weekly, ribavirin 600 mg p.o. b.i.d., telaprevir 750 mg p.o. q. 8 hours.   Allergies:  Ceclor causes a rash.   HABITS:  Smoking, quit over 5 years ago. Alcohol, denies interval consumption.   REVIEW OF SYSTEMS:  All 10 systems reviewed today with the patient and they are negative other than which is mentioned above. CES-D was 9.   PHYSICAL EXAMINATION:   Constitutional:  Some pallor but otherwise appeared well and in good spirits. Vital signs: Height 61  inches, weight 169 pounds, blood pressure 134/95, pulse 100, temperature 97.9 Fahrenheit.   Laboratories:  Last lab work I can access on 01/20/2011, shows hemoglobin 10.9 with a white count of 3.3, ANC 1.0 and a platelet count of 121. I am unable to retrieve further lab testing from the [facility] lab site for her  standing labs, and though I have discussed this [facility], they still have not corrected the problem.   Assessment:  The patient is a 35 year old woman with a history genotype 1a hepatitis C, IL 28 B CC, liver biopsy on 09/05/2004 showing grade 2 stage I disease. Her Pegasys, and ribavirin, and telaprevir started on  12/02/2010, putting her at week 12 of therapy starting tomorrow.  Week 4 HCV RNA was undetectable. She is due for another RNA today. If two HCV RNA's are completely undetectable, she can truncate therapy to 24  weeks. Considering her favorable genotype and lack of fibrosis on her biopsy in addition to her extended rapid virological response, she is tolerating quite therapy quite well.  In my discussion today with the patient, we discussed her progress to date. We discussed the possibility of truncating therapy to 24 weeks based on her viral load today, and in comparison, and using the viral  load from her previous from week 4. She is very pleased with her progress to date.   PLAN:  1. CBC with differential today. 2. HCV RNA today. 3. Liver enzymes today. 4. Continue with weekly CBCs. 5. Hepatitis A and B immune. 6. Return in 4 weeks' time.  Brooke Dare, MD  ADDENDUM HCV RNA undetectable at week 12.  Can shorten total treatment time to 24 weeks.   403 .[id number]  D:  Thu Oct 11 16:40:32 2012 ; T:  Thu Oct 11 19:09:33 2012  Job #:  [id number]

## 2011-03-07 ENCOUNTER — Telehealth: Payer: Self-pay | Admitting: Gastroenterology

## 2011-03-07 NOTE — Telephone Encounter (Signed)
Message received: Nicole [name] 1975/10/17 [place] patient has completed her Incivek and has had uncontrollable itching ever since, she has gone to [facility] and received steroid cream, and she has also been placed on a prednisone taper with no relief.  [employer]  I called and spoke with [name] who said she became more pruritic after stopping the telaprevir.  She took Benadryl and Allegra OTC without improvement.  She attended Dr Wynelle Link and was given a steroid taper for 7 days which ended yesterday.  The pruritis had resolved.  Will get her usual CBC this week.  I will want to check a viral load at her next visit at week 16 and I asked her to remind me, though I reassured her that the pruritis is not a symptom of relapse.

## 2011-03-09 ENCOUNTER — Other Ambulatory Visit: Payer: Self-pay | Admitting: Gastroenterology

## 2011-03-18 ENCOUNTER — Other Ambulatory Visit: Payer: Self-pay | Admitting: Gastroenterology

## 2011-03-18 LAB — CBC WITH DIFFERENTIAL/PLATELET
Basophils Absolute: 0 10*3/uL (ref 0.0–0.1)
Eosinophils Absolute: 0 10*3/uL (ref 0.0–0.7)
Eosinophils Relative: 0 % (ref 0–5)
HCT: 37.6 % (ref 36.0–46.0)
Hemoglobin: 11.6 g/dL — ABNORMAL LOW (ref 12.0–15.0)
MCH: 34.4 pg — ABNORMAL HIGH (ref 26.0–34.0)
MCHC: 30.9 g/dL (ref 30.0–36.0)
MCV: 111.6 fL — ABNORMAL HIGH (ref 78.0–100.0)
Platelets: 94 10*3/uL — ABNORMAL LOW (ref 150–400)
RDW: 17.4 % — ABNORMAL HIGH (ref 11.5–15.5)
WBC: 2.9 10*3/uL — ABNORMAL LOW (ref 4.0–10.5)

## 2011-03-23 ENCOUNTER — Ambulatory Visit (INDEPENDENT_AMBULATORY_CARE_PROVIDER_SITE_OTHER): Payer: BC Managed Care – PPO | Admitting: Gastroenterology

## 2011-03-23 DIAGNOSIS — B182 Chronic viral hepatitis C: Secondary | ICD-10-CM

## 2011-03-29 ENCOUNTER — Other Ambulatory Visit: Payer: Self-pay | Admitting: Family Medicine

## 2011-03-29 ENCOUNTER — Other Ambulatory Visit (HOSPITAL_COMMUNITY)
Admission: RE | Admit: 2011-03-29 | Discharge: 2011-03-29 | Disposition: A | Discharge status: Home / Self Care | Payer: BC Managed Care – PPO | Source: Ambulatory Visit | Attending: Family Medicine | Admitting: Family Medicine

## 2011-03-29 DIAGNOSIS — Z01419 Encounter for gynecological examination (general) (routine) without abnormal findings: Secondary | ICD-10-CM | POA: Insufficient documentation

## 2011-03-30 NOTE — Progress Notes (Signed)
NAME:  Nicole [name], Nicole [name]  MR#:  [id number]      DATE:  03/23/2011  DOB:  1976/04/06    cc: Consulting Physician: Elmon Else, MD, [facility], [address], [place], [place] [address]   Milagros Evener, MD, [facility], [address] [address], [place], [place] [address], Phone [phone]  Vinetta Bergamo. Richardson Dopp, PhD, [address], [address], [place], [place] [address], Phone [phone]  Primary Care Physician: [name], MD, [facility], [address], [place], [place] [address], Phone [phone]   Referring Physician: Dorena Cookey, MD, [facility], [address], [address], [place], [place] [address], Fax [phone]      REASON FOR VISIT:  Follow up of genotype hepatitis C, IL 28 B CC, week 16 of therapy.    History:  The patient returns today unaccompanied. She currently has no complaint directly referable to the treatment of her hepatitis C. She has seen Dr. Evelene Croon, her psychiatrist, recently within the last 2 weeks,  and she reports that her bipolar disorder is well controlled. Whereas the patient was amenorrheic early into treatment, she reports for last month she has had menstrual bleeding almost daily that is continuous.  She is due to see her primary physician next week and will discuss this with her. It is also recalled that she is on Kariva to help regulate her menstrual cycles and continues with this.   PAST MEDICAL HISTORY:  No interval change.   CURRENT MEDICATIONS:  Abilify 15 mg p.o. at bedtime, Wellbutrin 50 mg p.o. daily, Kariva once daily, albuterol inhaler p.r.n., ibuprofen p.r.n., Pegasys 180 mcg subcu weekly, ribavirin 600 mg p.o. b.i.d.   ALLERGIES:  Ceclor causes a rash.   HABITS:  Smoking quit over 5 years ago. Alcohol, denies interval consumption.   REVIEW OF SYSTEMS:  All 10 systems reviewed today with the patient and they are negative other than which is  mentioned above. Her CES-D was 5.   PHYSICAL EXAMINATION:   Constitutional:  Some pallor but otherwise appeared well. Vital signs: Height 61 inches, weight 164 pounds, down 5 pounds from previously. Blood pressure 124/86, pulse of 93, temperature 98.1.   LABORATORY STUDIES:  From 03/18/2011, hemoglobin was 11.6, white count 2.9, ANC 1.0, platelet count 94,000.   ASSESSMENT:  Patient is a 35 year old woman with history of genotype 1a hepatitis C, IL 28 B, CC. A liver biopsy on 09/05/2004, showing grade 2 stage I disease. Her Pegasys, ribavirin, and telaprevir started on 12/02/2010.  She is at week 16 of therapy due to start week 17 tomorrow. Her week 4 and week 12 HCV RNA were undetectable. Because of this it was decided that we could shorten her therapy to 24 weeks. It is likely that the  menstrual cycles are irregular because of her treatment. There is only an expected 8 more weeks of treatment to go.  In my discussion today with the patient, we discussed her progress to date. We discussed her irregular menstrual cycles. She will discuss this further with Dr. Wynelle Link next week.   PLAN:  1. Will hold off any labs today as she is going to have labs through her primary physician next week. I have asked her to have these faxed to my office [phone]. 2. Will stop the weekly CBCs as her hemoglobin has returned nicely since discontinuation of telaprevir. 3. She is hepatitis A and B immune. 4. Will see her again in 4 weeks' time at which  time will check her labs.            Brooke Dare, MD    ADDENDUM 03/29/11  HgB 11.8  ANC 1.4  Plts 103  ALT 29   403 .[id number]  D:  Thu Nov 08 13:22:14 2012 ; T:  Thu Nov 08 13:47:14 2012  Job #:  [id number]

## 2011-04-20 ENCOUNTER — Ambulatory Visit (INDEPENDENT_AMBULATORY_CARE_PROVIDER_SITE_OTHER): Payer: BC Managed Care – PPO | Admitting: Gastroenterology

## 2011-04-20 DIAGNOSIS — B182 Chronic viral hepatitis C: Secondary | ICD-10-CM

## 2011-04-22 LAB — CBC WITH DIFFERENTIAL/PLATELET
Eosinophils Relative: 0 % (ref 0–5)
HCT: 42.4 % (ref 36.0–46.0)
Hemoglobin: 12.8 g/dL (ref 12.0–15.0)
Lymphocytes Relative: 38 % (ref 12–46)
MCHC: 30.2 g/dL (ref 30.0–36.0)
MCV: 111.9 fL — ABNORMAL HIGH (ref 78.0–100.0)
Monocytes Absolute: 0.4 10*3/uL (ref 0.1–1.0)
Monocytes Relative: 8 % (ref 3–12)
Neutro Abs: 2.3 10*3/uL (ref 1.7–7.7)
WBC: 4.3 10*3/uL (ref 4.0–10.5)

## 2011-04-22 LAB — HEPATIC FUNCTION PANEL
ALT: 28 U/L (ref 0–35)
AST: 30 U/L (ref 0–37)
Alkaline Phosphatase: 41 U/L (ref 39–117)
Bilirubin, Direct: 0.1 mg/dL (ref 0.0–0.3)
Total Bilirubin: 0.6 mg/dL (ref 0.3–1.2)

## 2011-04-27 NOTE — Progress Notes (Signed)
NAME:  Nicole [name], Nicole [name]  MR#:  [id number]      DATE:  04/20/2011  DOB:  Aug 02, 1975    cc:  Consulting Physician: Elmon Else, MD, [facility], [address], [address], [place], [place] [address]   Milagros Evener, MD, [facility], [phone], [place], [place] [address], Phone [phone]   Vinetta Bergamo. Richardson Dopp, PhD, [address], [place], [place] [address], Phone [phone]   Primary Care Physician: [name], MD, [facility], [address], [place], [place] [address], Phone [phone]   Referring Physician: Dorena Cookey, MD, [facility], [address], [place], [place] [address], Fax [phone]     REASON FOR VISIT:  Follow up of genotype 1a hepatitis C, IL 28 B CC, week 20 of therapy.   history:  The patient returns unaccompanied. Six days ago she was seen by her primary care physician for complaint of a dry but annoying cough for which she was given Advair inhaler. There has been some improvement  since then. There is no associated sinus like symptoms or fever. She is not really not troubled by this at all. Again, she reports this is improving since the Advair was introduced. Otherwise there are no  complaints referable to treatment or directly referable to her hepatitis C.   PAST MEDICAL HISTORY:  No interval change.    medications:  Abilify 15 mg at bedtime, Wellbutrin 50 mg daily, Kariva once daily, albuterol inhaler p.r.n., Advair inhaler daily, ibuprofen p.r.n., Pegasys 180 mcg subcu weekly, ribavirin, 600 mg a.m., 400 mg p.m. not as previously noted 600 b.i.d.   ALLERGIES:  Ceclor causes a rash.   HABITS:  Smoking, quit over 5 years ago. Alcohol, denies interval consumption.   REVIEW OF SYSTEMS:  All 10 systems reviewed today with the patient and they are negative other than which was mentioned above. Her CES-D was 8.   PHYSICAL EXAMINATION:   Constitutional:  Appeared well with the exception of some pallor. Vital signs: Height 61 inches, weight 161 pounds. Blood pressure 114/80, pulse of 103, temperature 97.4 Fahrenheit.   laboratories:  Done on 03/29/2011, by her primary care physician, which also I did not have and has not been received by fax.  On 03/18/2011, her CBC showed a white count of 2.9, ANC was 1.0, hemoglobin 11.6 with a platelet count of 94,000, hemoglobin was stable.   ASSESSMENT:  The patient is a 35 year old woman with a history of genotype 1a hepatitis C, IL 28 b CC. Liver biopsy on 09/05/2004 showed grade 2 stage I disease. Her Pegasys and ribavirin, and telaprevir were  started 12/02/2010. She has completed week 19 of therapy, week 20 to start tomorrow. Week 4, 8, and 12 HCV RNA were undetectable because of this we are truncating her therapy to 24 weeks and she has  4 more weeks to go. I do not think that the rest of her complaints are related to her interferon, particular because they are improving with minimal intervention.  In my discussion today with the patient, we discussed the completion of therapy and subsequent viral load testing.   Plan:  1. Hepatitis A and B immune. 2. CBC, liver enzymes, and TSH today. 3. I would appreciate receiving a copy of any lab work from Dr. Wynelle Link done on 03/29/2011 faxed in [phone]. 4. To return in 4 weeks' time in follow up at which time will do her end of treatment viral load  and standard labs.            Brooke Dare, MD   [phone]  D:  Thu Dec 06 12:55:07 2012 ; T:  Thu Dec 06 13:33:16 2012  Job #:  [id number]

## 2011-05-18 ENCOUNTER — Ambulatory Visit (INDEPENDENT_AMBULATORY_CARE_PROVIDER_SITE_OTHER): Payer: BC Managed Care – PPO | Admitting: Gastroenterology

## 2011-05-18 DIAGNOSIS — B182 Chronic viral hepatitis C: Secondary | ICD-10-CM

## 2011-05-18 NOTE — Progress Notes (Signed)
cc:  Consulting Physician: Elmon Else, MD, [facility], [address], [place], [place] [address]   Nicole Evener, MD, [facility], [phone], [place], [place] [address], Phone [phone]   Vinetta Bergamo. Richardson Dopp, PhD, [address], [place], [place] [address], Phone [phone]   Primary Care Physician: [name], MD, [facility], [address], [place], [place] [address], Phone [phone]   Referring Physician: Dorena Cookey, MD, [facility], [address], [address], [place], [place] [address], Fax [phone]    REASON FOR VISIT: Follow up of genotype 1a hepatitis C, IL 28 B CC, week 20 of therapy.   HISTORY:  The patient returns unaccompanied.  She is at week 24 of therapy and is looking forward to stopping her ribavirin on 05/20/11, having taken her last Pegasys injection on 05/13/11.  There are no  complaints referable to treatment or directly referable to her hepatitis C.   PAST MEDICAL HISTORY:  No interval change.   MEDICATIONS:  Abilify 15 mg at bedtime, Wellbutrin 50 mg daily, Kariva once daily, albuterol inhaler p.r.n., Advair inhaler daily, ibuprofen p.r.n., Pegasys 180 mcg subcu weekly, ribavirin, 600 mg a.m., 400 mg p.m.  ALLERGIES:  Ceclor causes a rash.   HABITS:  Smoking, quit over 5 years ago. Alcohol, denies interval consumption.   REVIEW OF SYSTEMS:  All 10 systems reviewed today with the patient and they are negative other than which was mentioned above. Her CES-D was 12.   PHYSICAL EXAMINATION:  Constitutional: Appeared well with the exception of some pallor. Vital signs: Height 61 inches, weight 161 pounds. Blood pressure 125/82, pulse of 84, temperature 97.0 Fahrenheit.   LABORATORIES:  Prior labs reviewed during the appointment and discussed with Ms. Jean Rosenthal.     ASSESSMENT:  The patient is a 36 year old woman with a history of  genotype 1a hepatitis C, IL 28 b CC. Liver biopsy on 09/05/2004 showed grade 2 stage I disease. Her Pegasys and ribavirin, and telaprevir were  started 12/02/2010. She has completed 24 weeks of therapy by 05/20/2011.  Her week 4, 8, and 12 HCV RNAs were undetectable, and because of this we are truncating her therapy to 24 weeks.  In my discussion today with the patient, we discussed the completion of therapy and viral load testing.   PLAN:  1. Hepatitis A and B immune. 2. CBC and HCV RNA. 3. To return in 6 months' time in follow up at which time will do her SVR viral load and standard labs. 4. Warned about the teratogenicity of ribavirin for six months after completion of therapies.  Brooke Dare, MD    ADDENDUM  EOT HCV RNA undetectable.

## 2011-05-20 LAB — HEPATIC FUNCTION PANEL
ALT: 26 U/L (ref 0–35)
AST: 21 U/L (ref 0–37)
Albumin: 3.9 g/dL (ref 3.5–5.2)
Alkaline Phosphatase: 42 U/L (ref 39–117)
Total Protein: 7 g/dL (ref 6.0–8.3)

## 2011-05-20 LAB — CBC WITH DIFFERENTIAL/PLATELET
Lymphocytes Relative: 48 % — ABNORMAL HIGH (ref 12–46)
MCH: 33.2 pg (ref 26.0–34.0)
MCV: 109.5 fL — ABNORMAL HIGH (ref 78.0–100.0)
Monocytes Absolute: 0.3 10*3/uL (ref 0.1–1.0)
Monocytes Relative: 8 % (ref 3–12)
Neutro Abs: 1.6 10*3/uL — ABNORMAL LOW (ref 1.7–7.7)
Neutrophils Relative %: 43 % (ref 43–77)

## 2011-05-25 ENCOUNTER — Encounter: Payer: Self-pay | Admitting: Gastroenterology

## 2011-06-21 ENCOUNTER — Emergency Department (HOSPITAL_COMMUNITY): Payer: BC Managed Care – PPO

## 2011-06-21 ENCOUNTER — Emergency Department (HOSPITAL_COMMUNITY)
Admission: EM | Admit: 2011-06-21 | Discharge: 2011-06-22 | Disposition: A | Discharge status: Home / Self Care | Payer: BC Managed Care – PPO | Attending: Emergency Medicine | Admitting: Emergency Medicine

## 2011-06-21 ENCOUNTER — Other Ambulatory Visit: Payer: Self-pay

## 2011-06-21 ENCOUNTER — Encounter (HOSPITAL_COMMUNITY): Payer: Self-pay | Admitting: Emergency Medicine

## 2011-06-21 DIAGNOSIS — K738 Other chronic hepatitis, not elsewhere classified: Secondary | ICD-10-CM | POA: Insufficient documentation

## 2011-06-21 DIAGNOSIS — Z79899 Other long term (current) drug therapy: Secondary | ICD-10-CM | POA: Insufficient documentation

## 2011-06-21 DIAGNOSIS — J45909 Unspecified asthma, uncomplicated: Secondary | ICD-10-CM | POA: Insufficient documentation

## 2011-06-21 DIAGNOSIS — R109 Unspecified abdominal pain: Secondary | ICD-10-CM | POA: Insufficient documentation

## 2011-06-21 DIAGNOSIS — R10819 Abdominal tenderness, unspecified site: Secondary | ICD-10-CM | POA: Insufficient documentation

## 2011-06-21 DIAGNOSIS — R110 Nausea: Secondary | ICD-10-CM | POA: Insufficient documentation

## 2011-06-21 DIAGNOSIS — F329 Major depressive disorder, single episode, unspecified: Secondary | ICD-10-CM | POA: Insufficient documentation

## 2011-06-21 DIAGNOSIS — R1013 Epigastric pain: Secondary | ICD-10-CM

## 2011-06-21 DIAGNOSIS — F3289 Other specified depressive episodes: Secondary | ICD-10-CM | POA: Insufficient documentation

## 2011-06-21 HISTORY — DX: Major depressive disorder, single episode, unspecified: F32.9

## 2011-06-21 HISTORY — DX: Depression, unspecified: F32.A

## 2011-06-21 LAB — DIFFERENTIAL
Basophils Relative: 0 % (ref 0–1)
Eosinophils Absolute: 0 10*3/uL (ref 0.0–0.7)
Eosinophils Relative: 1 % (ref 0–5)
Lymphs Abs: 2.6 10*3/uL (ref 0.7–4.0)
Monocytes Relative: 8 % (ref 3–12)
Neutrophils Relative %: 45 % (ref 43–77)

## 2011-06-21 LAB — URINALYSIS, ROUTINE W REFLEX MICROSCOPIC
Bilirubin Urine: NEGATIVE
Leukocytes, UA: NEGATIVE
Nitrite: NEGATIVE
Specific Gravity, Urine: 1.011 (ref 1.005–1.030)
Urobilinogen, UA: 0.2 mg/dL (ref 0.0–1.0)
pH: 6 (ref 5.0–8.0)

## 2011-06-21 LAB — COMPREHENSIVE METABOLIC PANEL
Albumin: 3.6 g/dL (ref 3.5–5.2)
BUN: 10 mg/dL (ref 6–23)
Calcium: 9.2 mg/dL (ref 8.4–10.5)
Creatinine, Ser: 0.94 mg/dL (ref 0.50–1.10)
GFR calc Af Amer: >90 mL/min (ref >90)
Glucose, Bld: 87 mg/dL (ref 70–99)
Total Protein: 7.8 g/dL (ref 6.0–8.3)

## 2011-06-21 LAB — CBC
Hemoglobin: 13.6 g/dL (ref 12.0–15.0)
MCH: 32.5 pg (ref 26.0–34.0)
MCHC: 33.4 g/dL (ref 30.0–36.0)
MCV: 97.1 fL (ref 78.0–100.0)
RBC: 4.19 MIL/uL (ref 3.87–5.11)

## 2011-06-21 LAB — LIPASE, BLOOD: Lipase: 33 U/L (ref 11–59)

## 2011-06-21 LAB — POCT PREGNANCY, URINE: Preg Test, Ur: NEGATIVE

## 2011-06-21 MED ORDER — ONDANSETRON HCL 4 MG/2ML IJ SOLN
4.0000 mg | Freq: Once | INTRAMUSCULAR | Status: AC
  Administered 2011-06-21: 4 mg via INTRAVENOUS
  Filled 2011-06-21: qty 2

## 2011-06-21 NOTE — ED Notes (Signed)
MD into see patient and ordered tests

## 2011-06-21 NOTE — ED Notes (Signed)
Pt was at work and had C/O epigastric pain and was sent to clinic who called 911 and sent her here

## 2011-06-21 NOTE — ED Notes (Signed)
ZOX:[id number]<AV> Expected date:06/21/11<BR> Expected time: 6:12 PM<BR> Means of arrival:Ambulance<BR> Comments:<BR> EMS 110 GC - epigastric pain

## 2011-06-21 NOTE — ED Provider Notes (Signed)
History     CSN: [id number]  Arrival date & time 06/21/11  1308   First MD Initiated Contact with Patient 06/21/11 1833      Chief Complaint  Patient presents with  . Abdominal Pain    Started having abd pain @ 4:30    (Consider location/radiation/quality/duration/timing/severity/associated sxs/prior treatment) Patient is a 36 y.o. female presenting with abdominal pain. The history is provided by the patient.  Abdominal Pain The primary symptoms of the illness include abdominal pain and nausea. The primary symptoms of the illness do not include fever, vomiting, diarrhea, dysuria, vaginal discharge or vaginal bleeding. The current episode started 1 to 2 hours ago. The onset of the illness was sudden. The problem has been gradually improving.  The abdominal pain is located in the RUQ and epigastric region. The abdominal pain does not radiate. The severity of the abdominal pain is 9/10. The abdominal pain is relieved by nothing. Exacerbated by: nothing.  Associated with: she uses nsaids almost every day.  not associated with anyting today just started out of the blue. The patient states that she believes she is currently not pregnant. The patient has not had a change in bowel habit. Symptoms associated with the illness do not include chills, anorexia, constipation, urgency, hematuria, frequency or back pain. Significant associated medical issues do not include PUD, GERD or diabetes.    Past Medical History  Diagnosis Date  . Hep C w/ coma, chronic   . Asthma   . Depression (emotion)   . Hep C w/ coma, chronic     History reviewed. No pertinent past surgical history.  No family history on file.  History  Substance Use Topics  . Smoking status: Not on file  . Smokeless tobacco: Not on file  . Alcohol Use: No    OB History    Grav Para Term Preterm Abortions TAB SAB Ect Mult Living                  Review of Systems  Constitutional: Negative for fever and chills.    Respiratory: Negative for cough and chest tightness.   Gastrointestinal: Positive for nausea and abdominal pain. Negative for vomiting, diarrhea, constipation and anorexia.  Genitourinary: Negative for dysuria, urgency, frequency, hematuria, vaginal bleeding and vaginal discharge.  Musculoskeletal: Negative for back pain.  All other systems reviewed and are negative.    Allergies  Cefaclor  Home Medications   Current Outpatient Rx  Name Route Sig Dispense Refill  . ALBUTEROL SULFATE (2.5 MG/3ML) 0.083% IN NEBU Nebulization Take by nebulization as needed.      . ARIPIPRAZOLE 15 MG PO TABS Oral Take 15 mg by mouth daily.      . BUPROPION HCL ER (SR) 150 MG PO TB12 Oral Take 150 mg by mouth 2 (two) times daily.      Marland Kitchen KARIVA PO Oral Take 1 tablet by mouth daily.      Marland Kitchen FLUTICASONE PROPIONATE 50 MCG/ACT NA SUSP Nasal 2 sprays by Nasal route 2 (two) times daily as needed.      . IBUPROFEN 100 MG PO TABS Oral Take 100 mg by mouth as needed.        BP 123/78  Pulse 90  Temp(Src) 97.7 F (36.5 C) (Oral)  Resp 19  Ht 5\' 1"  (1.549 m)  Wt 167 lb (75.751 kg)  BMI 31.55 kg/m2  SpO2 97%  LMP 06/19/2011  Physical Exam  Nursing note and vitals reviewed. Constitutional: She is oriented to person,  place, and time. She appears well-developed and well-nourished. No distress.  HENT:  Head: Normocephalic and atraumatic.  Eyes: EOM are normal. Pupils are equal, round, and reactive to light.  Cardiovascular: Normal rate, regular rhythm, normal heart sounds and intact distal pulses.  Exam reveals no friction rub.   No murmur heard. Pulmonary/Chest: Effort normal and breath sounds normal. She has no wheezes. She has no rales.  Abdominal: Soft. Normal appearance and bowel sounds are normal. She exhibits no distension. There is tenderness in the right upper quadrant and epigastric area. There is guarding. There is no rebound and no CVA tenderness. No hernia.  Musculoskeletal: Normal range of  motion. She exhibits no tenderness.       No edema  Neurological: She is alert and oriented to person, place, and time. No cranial nerve deficit.  Skin: Skin is warm and dry. No rash noted.  Psychiatric: She has a normal mood and affect. Her behavior is normal.    ED Course  Procedures (including critical care time)  Labs Reviewed  COMPREHENSIVE METABOLIC PANEL - Abnormal; Notable for the following:    Total Bilirubin 0.2 (*)    GFR calc non Af Amer 79 (*)    All other components within normal limits  CBC  DIFFERENTIAL  LIPASE, BLOOD  URINALYSIS, ROUTINE W REFLEX MICROSCOPIC  POCT PREGNANCY, URINE   US Abdomen Complete  06/21/2011  *RADIOLOGY REPORT*  Clinical Data:  Abdominal pain  COMPLETE ABDOMINAL ULTRASOUND  Comparison:  08/02/2004  Findings:  Gallbladder:  No gallstones, gallbladder wall thickening, or pericholecystic fluid.  Common bile duct:  Measures up to four 1/2 mm, within normal limits.  Liver:  No focal lesion identified.  Within normal limits in parenchymal echogenicity.  IVC:  Appears normal.  Pancreas:  No focal abnormality identified within the head and neck.  The body and tail are obscured by overlying bowel gas artifact.  Spleen:  Measures 10.3 cm.  No focal abnormality.  Right Kidney:  Measures 9.4 cm.  No hydronephrosis or focal lesion.  Left Kidney:  Measures 10.9 cm.  No hydronephrosis or focal lesion.  Abdominal aorta:  No aneurysm identified.  IMPRESSION: Negative abdominal ultrasound.  Original Report Authenticated By: Waneta Martins, M.D.     No diagnosis found.    MDM   Patient with abrupt onset of epigastric pain today. It was 30 minutes after she ate a cookie. Cause nausea without vomiting pain was initially 9/10 and by the time she arrived here it was improving to 2/10. Patient states this is her happened before no history of GERD for gastric ulcers. She recently finished treatment for hepatitis C eradication. She denies any respiratory, cardiac, GU  complaints. On exam she is tender in the right upper chronic and epigastric area. Low suspicion for pancreatic issues. CBC, CMP, lipase, UA all within normal limits ultrasound of the abdomen ordered to rule out gallbladder pathology versus liver pathology. Doubt perforation or other abdominal pathology. Patient well appearing with normal vital signs here.  11:24 PM Ultrasound within normal limits and all labs normal. On reevaluation the patient's pain is completely resolved and has not returned. Discussed with her decreasing amount of NSAIDs she uses and foods to avoid. She will followup with her doctor if symptoms return      Gwyneth Sprout, MD 06/21/11 2325

## 2011-07-28 ENCOUNTER — Other Ambulatory Visit: Payer: Self-pay | Admitting: Dermatology

## 2011-12-07 ENCOUNTER — Ambulatory Visit (INDEPENDENT_AMBULATORY_CARE_PROVIDER_SITE_OTHER): Payer: BC Managed Care – PPO | Admitting: Gastroenterology

## 2011-12-07 DIAGNOSIS — B182 Chronic viral hepatitis C: Secondary | ICD-10-CM

## 2011-12-07 LAB — HEPATIC FUNCTION PANEL
AST: 27 U/L (ref 0–37)
Albumin: 4 g/dL (ref 3.5–5.2)
Total Bilirubin: 0.4 mg/dL (ref 0.3–1.2)

## 2011-12-11 LAB — HEPATITIS C RNA QUANTITATIVE: HCV Quantitative: NOT DETECTED IU/mL (ref <43)

## 2011-12-14 ENCOUNTER — Encounter: Payer: Self-pay | Admitting: Gastroenterology

## 2011-12-14 NOTE — Progress Notes (Signed)
NAME:  GLENDER, AUGUSTA    MR#:  [id number]      DATE:  12/07/2011  DOB:  [birth year]    cc: Consulting Physician: Elmon Else, MD, [facility], [address], [address], [place], [place] [address]   Milagros Evener, MD, [facility], [phone], [place], [place] [address], Phone [phone]   Vinetta Bergamo. Richardson Dopp, PhD, [address], [address], [place], [place] [address], Phone [phone]   Primary Care Physician: [name], MD, [facility], [address], [place], [place] [address], Phone [phone]   Referring Physician: Dorena Cookey, MD, [facility], [address], [place], [place] [address], Fax [phone]     REASON FOR VISIT: Follow up of genotype 1A hepatitis C, IL28B CC, 6 months post-completion of therapy.   HISTORY: The patient returns today unaccompanied. She completed 24 weeks of response guided therapy to combination of Pegasys, ribavirin, and telaprevir. Since discontinuation of treatment she is feeling well. There are no symptoms referable to her history of hepatitis C or symptoms to suggest cryoglobulin mediated or decompensated liver disease.   PAST MEDICAL HISTORY: No interval change with the exception of some respiratory tract infections.   CURRENT MEDICATIONS:  1. Abilify 15 mg daily.  2. Wellbutrin XL 150 mg b.i.d.  3. Kariva once daily.  4. Flonase 2 inhalations to each nostril daily.  5. Symbicort 80 mcg 2 inhalations b.i.d.   ALLERGIES: CECLOR causes a rash.   HABITS: Smoking:  Quit over 5-1/2 years ago. Alcohol:  Denies interval consumption.   REVIEW OF SYSTEMS: All 10 systems reviewed today with the patient and they are negative other than which is mentioned above. CES-D was 5.   PHYSICAL EXAMINATION: Constitutional:  Well appearing.  Vital signs:  Height 64 inches, weight 167 pounds, blood pressure 126/81, pulse 100, temperature 97.9 Fahrenheit.    LABORATORIES: On 08/22/2011 an HCV RNA was supposed to be drawn but was never done, which would have been her 34-month post-completion of therapy.   ASSESSMENT: The patient is a 36 year old woman with a history of genotype 1A hepatitis C, IL28B CC, liver biopsy 09/05/2004 showing grade 2 stage I disease. She commenced therapy with Pegasys, ribavirin, and telaprevir on 11/14/2010, and completed 24 weeks of therapy by 05/20/2011. A week 4, 8, and 12 HCV RNA were undetectable, making her eligible for response guided therapy. Unfortunately, she had an end of treatment response but did not have month 3 post-treatment viral load.   In my discussion today with the patient we discussed obtaining her six-month post-completion of therapy HCV RNA. I have explained if this is negative, then she does not need to return to see me again. I have explained to her that her antibody may remain positive for life, but a viral load is needed to determine disease activity. I have also explained that even if she is cured of hepatitis C, she is at risk of other diseases and NAFLD, so that she is to consider modifying risk factors for NAFLD.   PLAN:  1. Hepatitis A and B immune.  2. Liver enzymes and HCV RNA done today.  3. If HCV RNA negative, will write a letter indicating that she does not need to return.  4. If she has relapsed with a positive HCV RNA, will need to bring her back in 2014 to discuss the possibility of combination therapy such as possibly sofosbuvir and daclatasvir.  Brooke Dare, MD  ADDENDUM: No detectable level of HCV RNA.  ALT 36  AST 27. Letter written that she has an SVR but to watch out for risks for NAFLD.       [phone]  D:  Thu Jul 25 19:51:53 2013 ; T:  Fri Jul 26 12:38:42 2013  Job #:  [id number]

## 2012-02-07 ENCOUNTER — Encounter: Payer: Self-pay | Admitting: Pulmonary Disease

## 2012-02-08 ENCOUNTER — Ambulatory Visit (INDEPENDENT_AMBULATORY_CARE_PROVIDER_SITE_OTHER)
Admission: RE | Admit: 2012-02-08 | Discharge: 2012-02-08 | Disposition: A | Discharge status: Home / Self Care | Payer: BC Managed Care – PPO | Source: Ambulatory Visit | Attending: Pulmonary Disease | Admitting: Pulmonary Disease

## 2012-02-08 ENCOUNTER — Ambulatory Visit (INDEPENDENT_AMBULATORY_CARE_PROVIDER_SITE_OTHER): Payer: BC Managed Care – PPO | Admitting: Pulmonary Disease

## 2012-02-08 ENCOUNTER — Encounter: Payer: Self-pay | Admitting: Pulmonary Disease

## 2012-02-08 VITALS — BP 102/80 | HR 97 | Temp 98.2°F | Ht 60.5 in | Wt 169.6 lb

## 2012-02-08 DIAGNOSIS — J45991 Cough variant asthma: Secondary | ICD-10-CM | POA: Insufficient documentation

## 2012-02-08 DIAGNOSIS — R05 Cough: Secondary | ICD-10-CM

## 2012-02-08 MED ORDER — DM-DOXYLAMINE-ACETAMINOPHEN 30-12.5-1000 MG/30ML PO LIQD: 30.0000 mL | Freq: Every evening | ORAL | Status: DC | PRN

## 2012-02-08 MED ORDER — LORATADINE 10 MG PO TABS: 10.0000 mg | ORAL_TABLET | Freq: Every day | ORAL | Status: DC

## 2012-02-08 NOTE — Progress Notes (Signed)
Chief Complaint  Patient presents with  . Pulmonary Consult    Referred by Dr Wynelle Link. for asthma/cough   CC: Nicole [name]  History of Present Illness: Nicole [name] is a 36 y.o. female former smoker for evaluation of chronic cough.  She was diagnosed with asthma in 2006.  She was previously seen by Dr. Arlyss Gandy for her asthma.  She was never on allergy shots.  She was recently seen by Dr. Emeline Darling with ENT, and reports having normal exam and CT sinus.  She has a persistent cough for at least the last 1.5 years.  She does get sinus congestion.  She does not recall anything specifically that triggered her cough.  Her cough occurs mostly at night.  She usually does not bring up sputum.  She denies chest tightness or wheezing.  She gets a globus sensation and tickle in her throat.  She has constant post-nasal drip.  She denies eye irritation.  She had congestion in her ears over the Summer, but this is better now.  She denies symptoms of reflux.  There is no history of aspirin insensitivity.  She does not feel her breathing limits her activity.  She has been using symbicort, but does not think this helps much.  She has a persistent rash, and was seen by dermatology.  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  She is from [place].  She works in Primary school teacher.  She does not have any animal exposures.  There is no prior history of pneumonia or exposure to tuberculosis.  She quit smoking cigarettes and drinking alcohol in 2007.  She has been substance free since 2007.   Past Medical History  Diagnosis Date  . Hep C w/ coma, chronic     hep c tx successful  . Asthma   . Depression (emotion)   . Hep C w/ coma, chronic   . Bipolar disorder   . Abnormal pap     w LEEP  . Seasonal allergies     scratch test in 2006///mold issue in house    Past Surgical History  Procedure Date  . Leep 2008  . Dilation and curettage of uterus 1996, 2001, 2002  . Nasal fracture surgery 1999    repair     Current Outpatient Prescriptions on File Prior to Visit  Medication Sig Dispense Refill  . albuterol (PROVENTIL HFA;VENTOLIN HFA) 108 (90 BASE) MCG/ACT inhaler Inhale 2 puffs into the lungs every 6 (six) hours as needed.      . ARIPiprazole (ABILIFY) 15 MG tablet Take 15 mg by mouth daily.        . budesonide-formoterol (SYMBICORT) 80-4.5 MCG/ACT inhaler Inhale 2 puffs into the lungs 2 (two) times daily.      Marland Kitchen buPROPion (WELLBUTRIN SR) 150 MG 12 hr tablet Take 300 mg by mouth daily.       . Desogestrel-Ethinyl Estradiol (KARIVA PO) Take 1 tablet by mouth daily.        . fluticasone (FLONASE) 50 MCG/ACT nasal spray Place 2 sprays into the nose 2 (two) times daily.         Allergies  Allergen Reactions  . Cefaclor Rash    Family History  Problem Relation Age of Onset  . Emphysema Mother   . Heart disease Father   . Cancer Father     unsure??    History  Substance Use Topics  . Smoking status: Former Smoker -- 1.5 packs/day for 15 years    Types: Cigarettes  Quit date: 05/15/2005  . Smokeless tobacco: Not on file  . Alcohol Use: Yes     quit drinking on 2004---relapse in 2007---QUIT 07/2005    Review of Systems  Constitutional: Negative for fever, chills, diaphoresis, activity change, appetite change, fatigue and unexpected weight change.  HENT: Positive for congestion. Negative for hearing loss, ear pain, nosebleeds, sore throat, facial swelling, rhinorrhea, sneezing, mouth sores, trouble swallowing, neck pain, neck stiffness, dental problem, voice change, postnasal drip, sinus pressure, tinnitus and ear discharge.   Eyes: Negative for photophobia, discharge, itching and visual disturbance.  Respiratory: Positive for cough and shortness of breath. Negative for apnea, choking, chest tightness, wheezing and stridor.   Cardiovascular: Negative for chest pain, palpitations and leg swelling.  Gastrointestinal: Negative for nausea, vomiting, abdominal pain, constipation, blood  in stool and abdominal distention.  Genitourinary: Negative for dysuria, urgency, frequency, hematuria, flank pain, decreased urine volume and difficulty urinating.  Musculoskeletal: Negative for myalgias, back pain, joint swelling, arthralgias and gait problem.  Skin: Negative for color change, pallor and rash.  Neurological: Positive for headaches. Negative for dizziness, tremors, seizures, syncope, speech difficulty, weakness, light-headedness and numbness.  Hematological: Negative for adenopathy. Does not bruise/bleed easily.  Psychiatric/Behavioral: Positive for dysphoric mood ( treated with medicaiton). Negative for confusion, disturbed wake/sleep cycle and agitation. The patient is not nervous/anxious.    Physical Exam: Filed Vitals:   02/08/12 1625  BP: 102/80  Pulse: 97  Temp: 98.2 F (36.8 C)  TempSrc: Oral  Height: 5' 0.5" (1.537 m)  Weight: 169 lb 9.6 oz (76.93 kg)  SpO2: 97%  ,  Current Encounter SPO2  02/08/12 1625 97%  06/21/11 2337 95%  06/21/11 1826 97%    Wt Readings from Last 3 Encounters:  02/08/12 169 lb 9.6 oz (76.93 kg)  06/21/11 167 lb (75.751 kg)  01/12/11 165 lb (74.844 kg)    Body mass index is 32.58 kg/(m^2).   General - No distress ENT - TM clear, no sinus tenderness, no oral exudate, no LAN, no thyromegaly Cardiac - s1s2 regular, no murmur, pulses symmetric, no edema Chest - normal respiratory excursion, good air entry, no wheeze/rales/dullness Back - no focal tenderness Abd - soft, non-tender, no organomegaly, + bowel sounds Ext - normal motor strength Neuro - Cranial nerves are normal. PERLA. EOM's intact. Skin - no discernible active dermatitis, erythema, urticaria or inflammatory process. Psych - normal mood, and behavior.   Lab Results  Component Value Date   WBC 5.7 06/21/2011   HGB 13.6 06/21/2011   HCT 40.7 06/21/2011   MCV 97.1 06/21/2011   PLT 168 06/21/2011    Lab Results  Component Value Date   CREATININE 0.94 06/21/2011   BUN  10 06/21/2011   NA 138 06/21/2011   K 3.8 06/21/2011   CL 103 06/21/2011   CO2 24 06/21/2011    Lab Results  Component Value Date   ALT 36* 12/07/2011   AST 27 12/07/2011   ALKPHOS 36* 12/07/2011   BILITOT 0.4 12/07/2011    Lab Results  Component Value Date   TSH 2.628 04/20/2011    Assessment/Plan:  Coralyn Helling, MD [facility] Pager:  [phone] 02/08/2012, 4:41 PM

## 2012-02-08 NOTE — Progress Notes (Deleted)
  Subjective:    Patient ID: Nicole [name], female    DOB: 16-Mar-1976, 36 y.o.   MRN: [id number]  HPI    Review of Systems  Constitutional: Negative for fever, chills, diaphoresis, activity change, appetite change, fatigue and unexpected weight change.  HENT: Positive for congestion. Negative for hearing loss, ear pain, nosebleeds, sore throat, facial swelling, rhinorrhea, sneezing, mouth sores, trouble swallowing, neck pain, neck stiffness, dental problem, voice change, postnasal drip, sinus pressure, tinnitus and ear discharge.   Eyes: Negative for photophobia, discharge, itching and visual disturbance.  Respiratory: Positive for cough and shortness of breath. Negative for apnea, choking, chest tightness, wheezing and stridor.   Cardiovascular: Negative for chest pain, palpitations and leg swelling.  Gastrointestinal: Negative for nausea, vomiting, abdominal pain, constipation, blood in stool and abdominal distention.  Genitourinary: Negative for dysuria, urgency, frequency, hematuria, flank pain, decreased urine volume and difficulty urinating.  Musculoskeletal: Negative for myalgias, back pain, joint swelling, arthralgias and gait problem.  Skin: Negative for color change, pallor and rash.  Neurological: Positive for headaches. Negative for dizziness, tremors, seizures, syncope, speech difficulty, weakness, light-headedness and numbness.  Hematological: Negative for adenopathy. Does not bruise/bleed easily.  Psychiatric/Behavioral: Positive for dysphoric mood ( treated with medicaiton). Negative for confusion, disturbed wake/sleep cycle and agitation. The patient is not nervous/anxious.        Objective:   Physical Exam        Assessment & Plan:

## 2012-02-08 NOTE — Patient Instructions (Addendum)
Continue nasal irrigation Flonase two spray each nostril daily Stop symbicort Albuterol two puffs as needed for cough, wheeze, or chest congestion Use claritin as needed for sinus congestion Use salt water gargles daily until cough is better Delsym 30 ml at night as needed until cough is better Sip water when you have urge to cough Chest xray today Will schedule breathing test (PFT) Will get records from Dr. Willa Rough and Dr. Emeline Darling Follow up in 6 weeks

## 2012-02-12 ENCOUNTER — Telehealth: Payer: Self-pay | Admitting: Pulmonary Disease

## 2012-02-12 NOTE — Telephone Encounter (Signed)
Dg Chest 2 View  02/08/2012  *RADIOLOGY REPORT*  Clinical Data: 36 year old female chronic cough and shortness of breath.  CHEST - 2 VIEW  Comparison: None.  Findings: Lung volumes are within normal limits. Normal cardiac size and mediastinal contours.  Visualized tracheal air column is within normal limits.  No pneumothorax, pulmonary edema, pleural effusion or confluent pulmonary opacity. No acute osseous abnormality identified.  IMPRESSION: No acute cardiopulmonary abnormality.   Original Report Authenticated By: Harley Hallmark, M.D.    Will have my nurse inform pt that CXR was normal.  No change to current tx plan.

## 2012-02-13 NOTE — Telephone Encounter (Signed)
I spoke with patient about results and she verbalized understanding and had no questions 

## 2012-02-13 NOTE — Assessment & Plan Note (Addendum)
She has chronic cough.  She is a former smoker.  She has history of asthma, but has not experienced benefit from inhaler therapy for asthma.    She has significant complaints for post-nasal drip and upper airway irritation.  This is the likely cause for her cough.  Will arrange for chest xray and PFT's to further assess.  Will request copies of her records from Dr. Willa Rough and Dr. Emeline Darling.  Will have her stop symbicort, but continue prn albuterol.  She is to use nasal irrigation and flonase daily.  She is to use salt water gargles, and sip water when she has an urge to cough.  She is to use claritin as needed for sinus congestion.  She can use delsym as needed for her cough.  Her history was not suggestive of reflux as being a contributor to her cough.  Will defer interventions for reflux at this time.

## 2012-03-21 ENCOUNTER — Ambulatory Visit: Payer: BC Managed Care – PPO | Admitting: Pulmonary Disease

## 2012-04-02 IMAGING — US US ABDOMEN COMPLETE
1 series · 14 of 25 positions shown · non-contrast
Comparison: 08/02/2004

CLINICAL DATA: Abdominal pain

COMPLETE ABDOMINAL ULTRASOUND

[Series 1: us abdomen complete · 0.34mm/px · 14 of 45 slices shown]
[im 1/45]
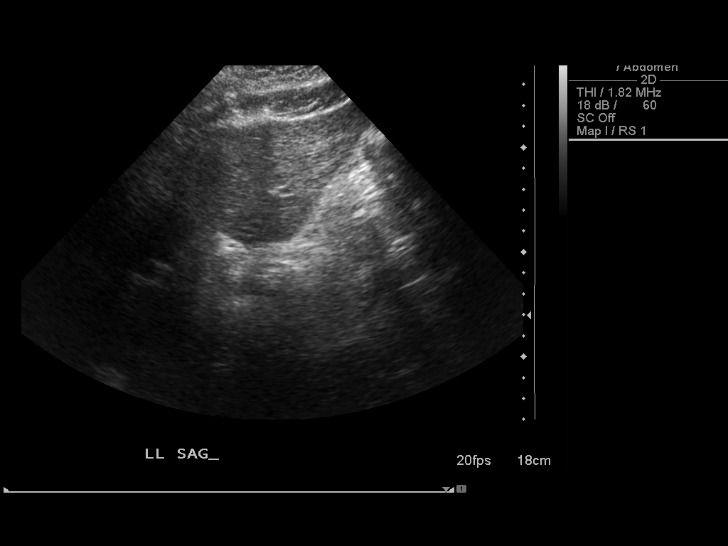
[im 4/45]
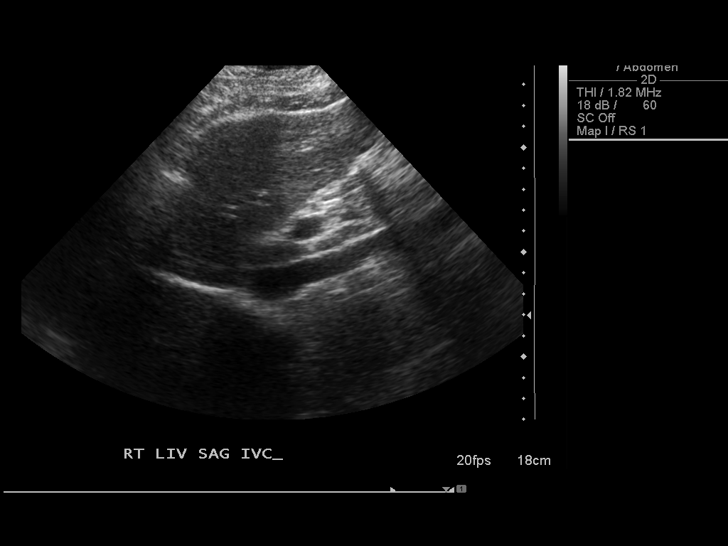
[im 8/45]
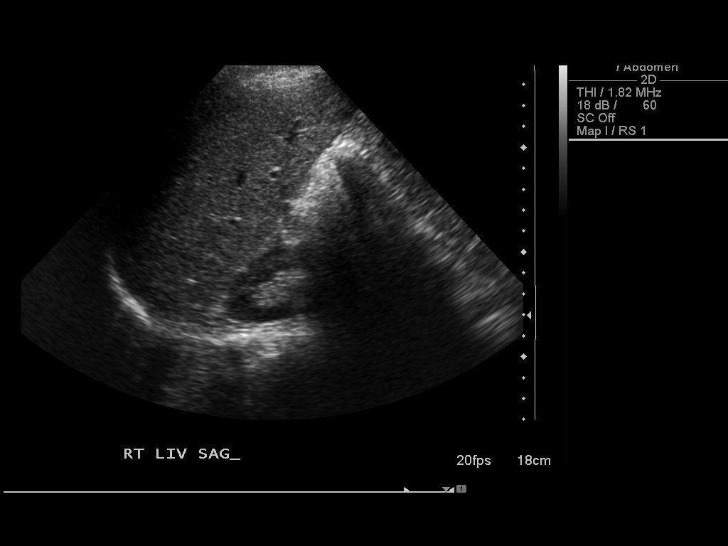
[im 12/45]
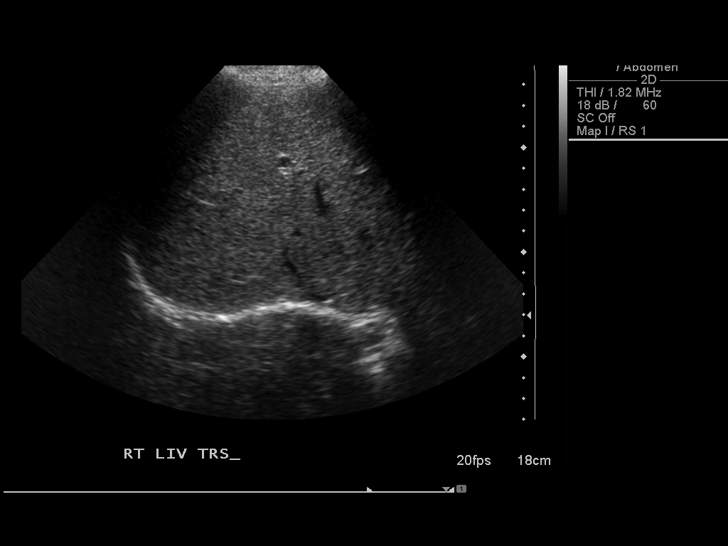
[im 15/45]
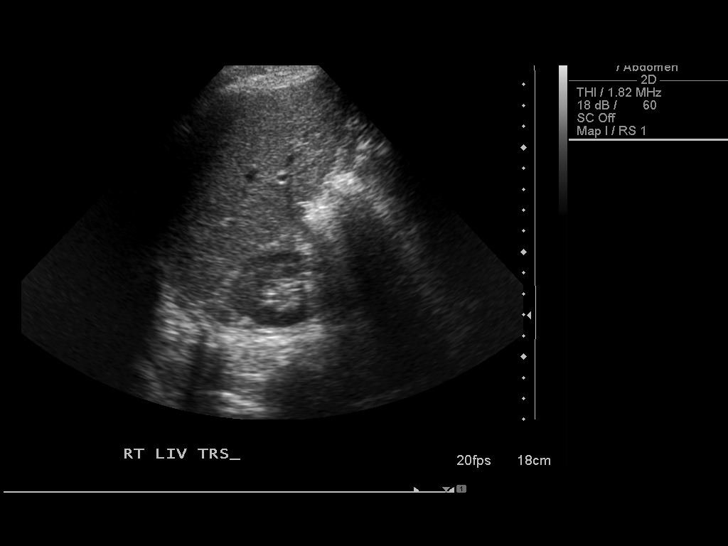
[im 17/45]
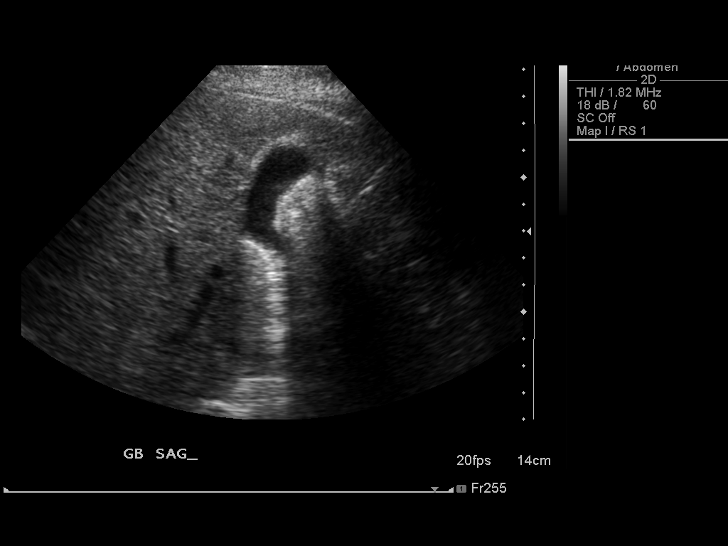
[im 21/45]
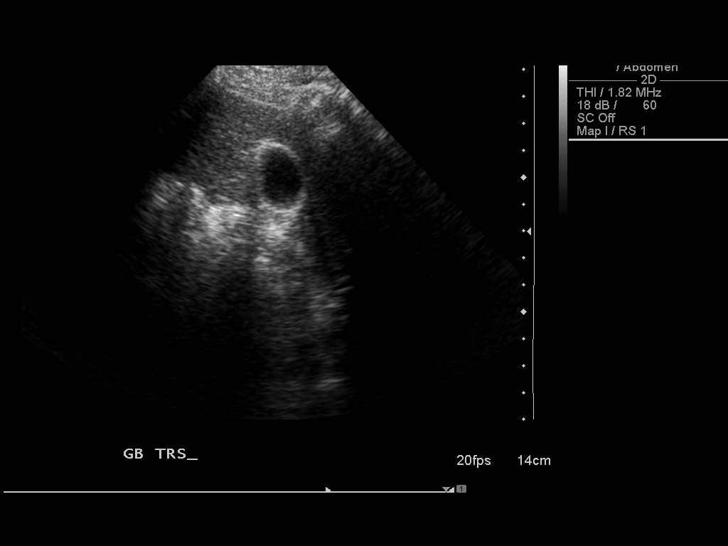
[im 24/45]
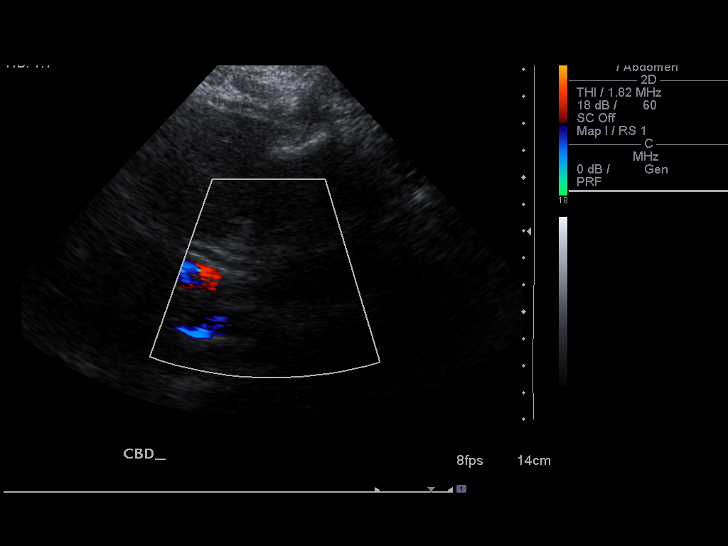
[im 28/45]
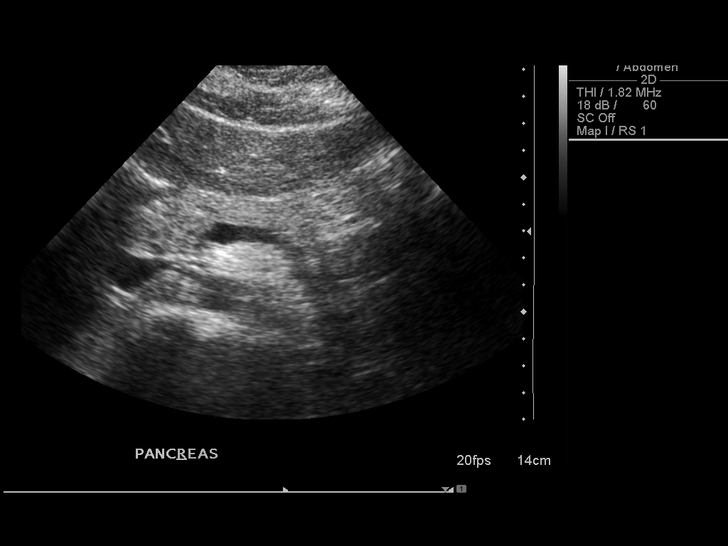
[im 30/45]
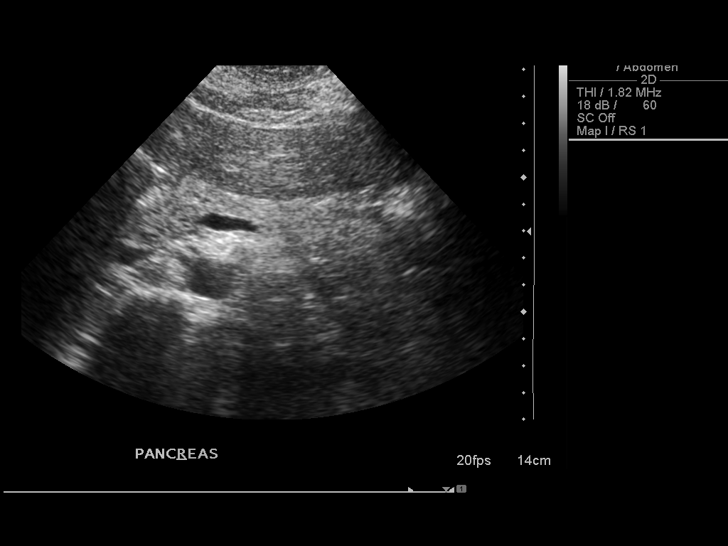
[im 34/45]
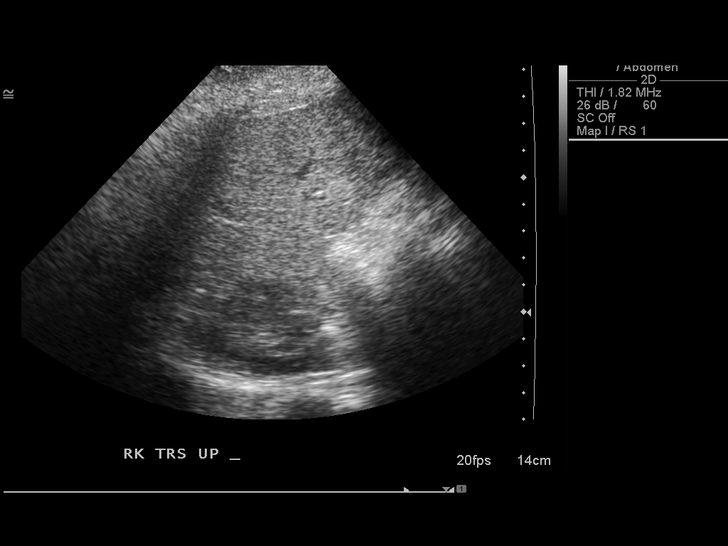
[im 37/45]
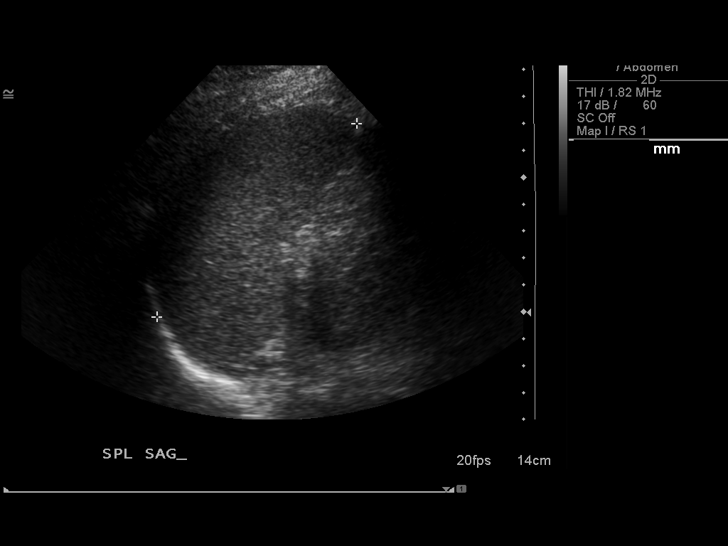
[im 41/45]
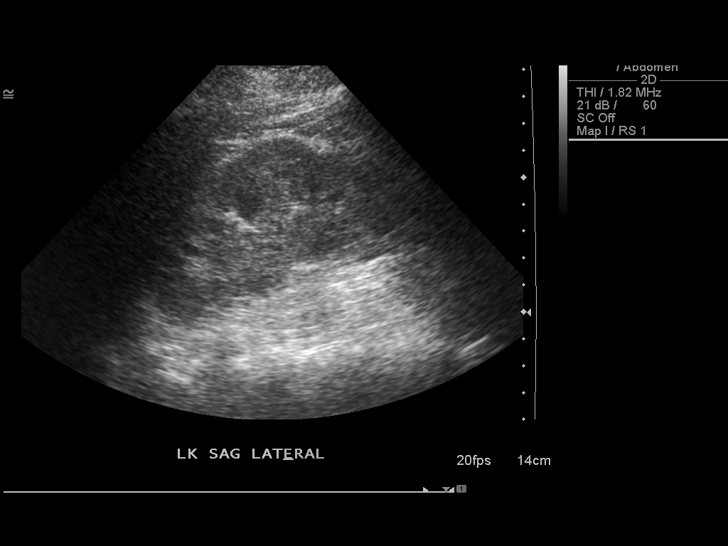
[im 45/45]
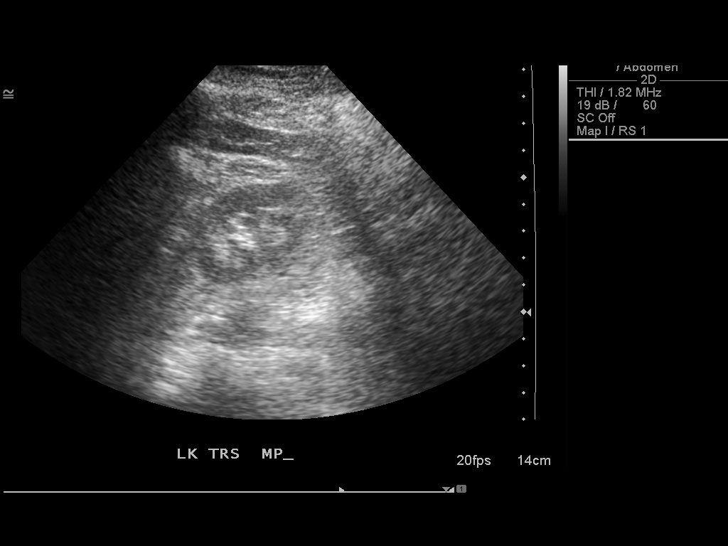

[14 of 25 positions shown; findings below may reference images not displayed]

FINDINGS: Gallbladder:  No gallstones, gallbladder wall thickening, or
pericholecystic fluid.

Common bile duct:  Measures up to four 1/2 mm, within normal
limits.

Liver:  No focal lesion identified.  Within normal limits in
parenchymal echogenicity.

IVC:  Appears normal.

Pancreas:  No focal abnormality identified within the head and
neck.  The body and tail are obscured by overlying bowel gas
artifact.

Spleen:  Measures 10.3 cm.  No focal abnormality.

Right Kidney:  Measures 9.4 cm.  No hydronephrosis or focal lesion.

Left Kidney:  Measures 10.9 cm.  No hydronephrosis or focal lesion.

Abdominal aorta:  No aneurysm identified.
IMPRESSION: Negative abdominal ultrasound.

## 2012-04-16 ENCOUNTER — Encounter: Payer: Self-pay | Admitting: Pulmonary Disease

## 2012-04-16 ENCOUNTER — Ambulatory Visit (INDEPENDENT_AMBULATORY_CARE_PROVIDER_SITE_OTHER): Payer: BC Managed Care – PPO | Admitting: Pulmonary Disease

## 2012-04-16 VITALS — BP 108/84 | HR 102 | Temp 98.5°F | Ht 60.0 in | Wt 171.0 lb

## 2012-04-16 DIAGNOSIS — R05 Cough: Secondary | ICD-10-CM

## 2012-04-16 LAB — PULMONARY FUNCTION TEST

## 2012-04-16 MED ORDER — MONTELUKAST SODIUM 10 MG PO TABS: 10.0000 mg | ORAL_TABLET | Freq: Every day | ORAL | Status: DC

## 2012-04-16 NOTE — Assessment & Plan Note (Signed)
Likely from asthma and post-nasal drip.  She is to continue symbicort and prn albuterol.  Will add singulair.  She is to continue nasal irrigation and flonase.

## 2012-04-16 NOTE — Progress Notes (Signed)
  Chief Complaint  Patient presents with  . Follow-up    w/ PFT. cough has unchanged. some days it is worse. pt stopped the symbicort for about a month and the cough got worse and restarted it. she has not taking then in 4 days    History of Present Illness: Nicole [name] is a 36 y.o. female former smoker with chronic cough from asthma, and post-nasal drip.  She felt worse when off symbicort.  She still has cough, but has been doing better.  She is using nasal irrigation and flonase, and these help.  She had PFT earlier today, and this was normal.   Tests: CXR 02/08/12>>normal PFT 04/16/12>>FEV1 2.97 (116%), FEV1% 79, TLC 5.05 (118%), DLCO 79%, no BD response  Past Medical History  Diagnosis Date  . Hep C w/ coma, chronic     hep c tx successful  . Asthma   . Depression (emotion)   . Hep C w/ coma, chronic   . Bipolar disorder   . Abnormal pap     w LEEP  . Seasonal allergies     scratch test in 2006///mold issue in house    Past Surgical History  Procedure Date  . Leep 2008  . Dilation and curettage of uterus 1996, 2001, 2002  . Nasal fracture surgery 1999    repair    Current Outpatient Prescriptions on File Prior to Visit  Medication Sig Dispense Refill  . albuterol (PROVENTIL HFA;VENTOLIN HFA) 108 (90 BASE) MCG/ACT inhaler Inhale 2 puffs into the lungs every 6 (six) hours as needed.      . ARIPiprazole (ABILIFY) 15 MG tablet Take 15 mg by mouth daily.        Marland Kitchen buPROPion (WELLBUTRIN SR) 150 MG 12 hr tablet Take 300 mg by mouth daily.       . Desogestrel-Ethinyl Estradiol (KARIVA PO) Take 1 tablet by mouth daily.        . fluticasone (FLONASE) 50 MCG/ACT nasal spray Place 2 sprays into the nose 2 (two) times daily.         Allergies  Allergen Reactions  . Cefaclor Rash    Physical Exam: Filed Vitals:   04/16/12 1555 04/16/12 1556  BP:  108/84  Pulse:  102  Temp: 98.5 F (36.9 C)   TempSrc: Oral   Height: 5' (1.524 m)   Weight: 171 lb (77.565 kg)   SpO2:   97%  ,  Current Encounter SPO2  04/16/12 1556 97%  02/08/12 1625 97%  06/21/11 2337 95%  06/21/11 1826 97%    Wt Readings from Last 3 Encounters:  04/16/12 171 lb (77.565 kg)  02/08/12 169 lb 9.6 oz (76.93 kg)  06/21/11 167 lb (75.751 kg)    Body mass index is 33.40 kg/(m^2).   General - No distress ENT - No sinus tenderness, no oral exudate, no LAN Cardiac - s1s2 regular, no murmur Chest - No wheeze/rales/dullness, good air entry, normal respiratory excursion Back - No focal tenderness Abd - Soft, non-tender Ext - No edema Neuro - Normal strength Skin - No rashes Psych - Normal mood, and behavior   Assessment/Plan:  Coralyn Helling, MD [place] [facility]/Sleep Pager:  [phone] 04/16/2012, 4:06 PM

## 2012-04-16 NOTE — Progress Notes (Signed)
PFT done today. 

## 2012-04-16 NOTE — Patient Instructions (Signed)
Singulair 10 mg pill >> one pill nightly before bedtime Follow up in 6 to 8 weeks

## 2012-05-30 ENCOUNTER — Encounter: Payer: Self-pay | Admitting: Pulmonary Disease

## 2012-05-30 ENCOUNTER — Ambulatory Visit (INDEPENDENT_AMBULATORY_CARE_PROVIDER_SITE_OTHER): Payer: BC Managed Care – PPO | Admitting: Pulmonary Disease

## 2012-05-30 ENCOUNTER — Other Ambulatory Visit: Payer: BC Managed Care – PPO

## 2012-05-30 VITALS — BP 102/68 | HR 117 | Temp 97.7°F | Ht 61.0 in | Wt 173.8 lb

## 2012-05-30 DIAGNOSIS — J309 Allergic rhinitis, unspecified: Secondary | ICD-10-CM

## 2012-05-30 DIAGNOSIS — J45991 Cough variant asthma: Secondary | ICD-10-CM

## 2012-05-30 DIAGNOSIS — J45909 Unspecified asthma, uncomplicated: Secondary | ICD-10-CM

## 2012-05-30 NOTE — Progress Notes (Signed)
Chief Complaint  Patient presents with  . Follow-up    cough is aggravated during the day x past couple days. occasionally will get up white phlem  x couple days. c/o wheezing, chest tx, increase SOB (but some better today)., body aches.     History of Present Illness: Nicole [name] is a 37 y.o. female former smoker with chronic cough from asthma, and allergic rhinitis with post-nasal drip.  She feels like she is 25% better compared to last visit.  She still has sinus congestion and post-nasal drip.  She still gets chest congestion with cough and white sputum.  She denies fever, gland swelling, or skin rash.  She thinks her symptoms get worse when she is around mold or dust.  She is using her albuterol twice per week.  Tests: CXR 02/08/12>>normal PFT 04/16/12>>FEV1 2.97 (116%), FEV1% 79, TLC 5.05 (118%), DLCO 79%, no BD response  Past Medical History  Diagnosis Date  . Hep C w/ coma, chronic     hep c tx successful  . Asthma   . Depression (emotion)   . Hep C w/ coma, chronic   . Bipolar disorder   . Abnormal pap     w LEEP  . Seasonal allergies     scratch test in 2006///mold issue in house    Past Surgical History  Procedure Date  . Leep 2008  . Dilation and curettage of uterus 1996, 2001, 2002  . Nasal fracture surgery 1999    repair    Current Outpatient Prescriptions on File Prior to Visit  Medication Sig Dispense Refill  . albuterol (PROVENTIL HFA;VENTOLIN HFA) 108 (90 BASE) MCG/ACT inhaler Inhale 2 puffs into the lungs every 6 (six) hours as needed.      . ARIPiprazole (ABILIFY) 15 MG tablet Take 15 mg by mouth daily.        . budesonide-formoterol (SYMBICORT) 160-4.5 MCG/ACT inhaler Inhale 2 puffs into the lungs 2 (two) times daily.      Marland Kitchen buPROPion (WELLBUTRIN SR) 150 MG 12 hr tablet Take 300 mg by mouth daily.       . Desogestrel-Ethinyl Estradiol (KARIVA PO) Take 1 tablet by mouth daily.        . fluticasone (FLONASE) 50 MCG/ACT nasal spray Place 2 sprays into the  nose 2 (two) times daily.       . montelukast (SINGULAIR) 10 MG tablet Take 1 tablet (10 mg total) by mouth at bedtime.  30 tablet  11    Allergies  Allergen Reactions  . Cefaclor Rash    Physical Exam: Filed Vitals:   05/30/12 0903  BP: 102/68  Pulse: 117  Temp: 97.7 F (36.5 C)  TempSrc: Oral  Height: 5\' 1"  (1.549 m)  Weight: 173 lb 12.8 oz (78.835 kg)  SpO2: 95%  ,  Current Encounter SPO2  05/30/12 0903 95%  04/16/12 1556 97%  02/08/12 1625 97%    Wt Readings from Last 3 Encounters:  05/30/12 173 lb 12.8 oz (78.835 kg)  04/16/12 171 lb (77.565 kg)  02/08/12 169 lb 9.6 oz (76.93 kg)    Body mass index is 32.84 kg/(m^2).   General - No distress ENT - No sinus tenderness, TM clear, clear nasal discharge, no oral exudate, no LAN Cardiac - s1s2 regular, no murmur Chest - No wheeze/rales/dullness, good air entry, normal respiratory excursion Back - No focal tenderness Abd - Soft, non-tender Ext - No edema Neuro - Normal strength Skin - No rashes Psych - Normal mood, and behavior  Assessment/Plan:  Coralyn Helling, MD [place] [facility]Critical [facility]/Sleep Pager:  [phone] 05/30/2012, 9:10 AM

## 2012-05-30 NOTE — Assessment & Plan Note (Signed)
She has some improvement since addition of singulair.  Will continue her current regimen.

## 2012-05-30 NOTE — Patient Instructions (Signed)
Lab work today >> will call with results Follow up in 4 months

## 2012-05-30 NOTE — Assessment & Plan Note (Signed)
She is to continue flonase.  Will check RAST with IgE.

## 2012-05-30 NOTE — Addendum Note (Signed)
Addended by: Tommie Sams on: 05/30/2012 09:25 AM   Modules accepted: Orders

## 2012-05-31 LAB — ALLERGY FULL PROFILE
Allergen, D pternoyssinus,d7: <0.1 kU/L
Allergen,Goose feathers, e70: <0.1 kU/L
Aspergillus fumigatus, IgG: <0.1 kU/L
Bermuda Grass: <0.1 kU/L
Common Ragweed: <0.1 kU/L
Dog Dander: <0.1 kU/L
G005 Rye, Perennial: <0.1 kU/L
Goldenrod: <0.1 kU/L
Helminthosporium halodes: <0.1 kU/L
House Dust Hollister: <0.1 kU/L
IgE (Immunoglobulin E), Serum: <1.5 IU/mL (ref 0.0–180.0)
Plantain: <0.1 kU/L
Stemphylium Botryosum: <0.1 kU/L

## 2012-06-04 ENCOUNTER — Telehealth: Payer: Self-pay | Admitting: Pulmonary Disease

## 2012-06-04 NOTE — Telephone Encounter (Signed)
1/171/14 RAST negative, IgE < 1.5  Will have my nurse inform patient that allergy test was normal/negative.  No change to current treatment plan.

## 2012-06-04 NOTE — Telephone Encounter (Signed)
I spoke with patient about results and she verbalized understanding and had no questions 

## 2012-06-04 NOTE — Telephone Encounter (Signed)
Patient returning call about results.  [phone]

## 2012-06-04 NOTE — Telephone Encounter (Signed)
[id number] x1 

## 2012-06-17 ENCOUNTER — Telehealth: Payer: Self-pay | Admitting: Pulmonary Disease

## 2012-06-17 NOTE — Telephone Encounter (Signed)
Pt is wanting to know what was tested for because she is seeing an ENT for unrelated sinus issues and they are also wanting to do some allergy testing. She is afraid that her insurance won't cover a second test. I advised her that if she wanted these records sent to this ENT office she will need to come by and sign a records release and we would be happy to send them. She agreed and will come by sometime and sign this release.

## 2012-07-02 ENCOUNTER — Ambulatory Visit: Payer: Self-pay | Admitting: Unknown Physician Specialty

## 2012-07-10 ENCOUNTER — Other Ambulatory Visit (HOSPITAL_COMMUNITY)
Admission: RE | Admit: 2012-07-10 | Discharge: 2012-07-10 | Disposition: A | Discharge status: Home / Self Care | Payer: BC Managed Care – PPO | Source: Ambulatory Visit | Attending: Family Medicine | Admitting: Family Medicine

## 2012-07-10 ENCOUNTER — Other Ambulatory Visit: Payer: Self-pay | Admitting: Family Medicine

## 2012-07-10 DIAGNOSIS — Z01419 Encounter for gynecological examination (general) (routine) without abnormal findings: Secondary | ICD-10-CM | POA: Insufficient documentation

## 2012-07-31 ENCOUNTER — Ambulatory Visit: Payer: Self-pay | Admitting: Unknown Physician Specialty

## 2012-08-20 ENCOUNTER — Ambulatory Visit: Payer: Self-pay | Admitting: Unknown Physician Specialty

## 2012-10-15 ENCOUNTER — Ambulatory Visit: Payer: BC Managed Care – PPO | Admitting: Pulmonary Disease

## 2013-04-03 ENCOUNTER — Other Ambulatory Visit: Payer: Self-pay | Admitting: Pulmonary Disease

## 2013-05-13 ENCOUNTER — Other Ambulatory Visit: Payer: Self-pay | Admitting: Pulmonary Disease

## 2013-06-14 ENCOUNTER — Other Ambulatory Visit: Payer: Self-pay | Admitting: Pulmonary Disease

## 2013-10-09 ENCOUNTER — Telehealth: Payer: Self-pay | Admitting: Family Medicine

## 2013-10-09 ENCOUNTER — Ambulatory Visit (INDEPENDENT_AMBULATORY_CARE_PROVIDER_SITE_OTHER): Payer: BC Managed Care – PPO | Admitting: Family Medicine

## 2013-10-09 ENCOUNTER — Encounter: Payer: Self-pay | Admitting: Family Medicine

## 2013-10-09 VITALS — BP 110/82 | HR 110 | Temp 97.7°F | Ht 61.0 in | Wt 179.0 lb

## 2013-10-09 DIAGNOSIS — Z9989 Dependence on other enabling machines and devices: Secondary | ICD-10-CM

## 2013-10-09 DIAGNOSIS — G4733 Obstructive sleep apnea (adult) (pediatric): Secondary | ICD-10-CM

## 2013-10-09 DIAGNOSIS — F32A Depression, unspecified: Secondary | ICD-10-CM

## 2013-10-09 DIAGNOSIS — F3289 Other specified depressive episodes: Secondary | ICD-10-CM

## 2013-10-09 DIAGNOSIS — Z3200 Encounter for pregnancy test, result unknown: Secondary | ICD-10-CM

## 2013-10-09 DIAGNOSIS — F329 Major depressive disorder, single episode, unspecified: Secondary | ICD-10-CM

## 2013-10-09 HISTORY — DX: Obstructive sleep apnea (adult) (pediatric): G47.33

## 2013-10-09 LAB — POCT URINE PREGNANCY: PREG TEST UR: NEGATIVE

## 2013-10-09 MED ORDER — DESOGESTREL-ETHINYL ESTRADIOL 0.15-0.02/0.01 MG (21/5) PO TABS: 1.0000 | ORAL_TABLET | Freq: Every day | ORAL | Status: DC

## 2013-10-09 NOTE — Addendum Note (Signed)
Addended by: Johnella Moloney on: 10/09/2013 10:05 AM   Modules accepted: Orders

## 2013-10-09 NOTE — Progress Notes (Signed)
Pre visit review using our clinic review tool, if applicable. No additional management support is needed unless otherwise documented below in the visit note. 

## 2013-10-09 NOTE — Telephone Encounter (Signed)
Pt returned your call said you can call her back

## 2013-10-09 NOTE — Patient Instructions (Signed)
-  We have ordered labs or studies at this visit. It can take up to 1-2 weeks for results and processing. We will contact you with instructions IF your results are abnormal. Normal results will be released to your [facility]. If you have not heard from [place] or can not find your results in [facility] in 2 weeks please contact our office.  -PLEASE SIGN UP FOR MYCHART TODAY   We recommend the following healthy lifestyle measures: - eat a healthy diet consisting of lots of vegetables, fruits, beans, nuts, seeds, healthy meats such as white chicken and fish and whole grains.  - avoid fried foods, fast food, processed foods, sodas, red meet and other fattening foods.  - get a least 150 minutes of aerobic exercise per week.   Follow up in: next few months for physical

## 2013-10-09 NOTE — Progress Notes (Signed)
No chief complaint on file.   HPI:  Nicole [name] is here to establish care. Reports had trouble getting into current PCP - so transferring.  Last PCP and physical: 08/2012; hx abnormal pap in 2009 s/p leep with gyn - reports all paps since normal. Reports liver enzymes normal the last few years.  Has the following chronic problems and concerns today:  Patient Active Problem List   Diagnosis Date Noted  . Depression 10/09/2013  . OSA on CPAP - sees Dr. Jenne Campus in ENT 10/09/2013  . Allergic rhinitis 05/30/2012  . Cough variant asthma 02/08/2012   Asthma and Allergies and chronic cough: -followed by pulmonologist - for asthmatic bronchitis for many many years but feels like never helped - wants to see allergist for second opionion -uses albuterol a few times per week for exercise -has not seen Dr. Craige Cotta in a while and prefers another referral to pulm for her chronic cough -takes zyrtec  Depression: -sees Dr. 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  Past Medical History  Diagnosis Date  . Hep C w/ coma, chronic     hep c tx successful  . Asthma   . Depression (emotion)   . Abnormal pap     w LEEP  . Seasonal allergies     scratch test in 2006///mold issue in house  . Blood in stool   . Frequent headaches   . OSA on CPAP - sees Dr. Jenne Campus in ENT 10/09/2013    Family History  Problem Relation Age of Onset  . Emphysema Mother   . Heart disease Father   . Cancer Father     unsure??    History   Social History  . Marital Status: Single    Spouse Name: N/A    Number of Children: N/A  . Years of Education: N/A   Occupational History  . Risk analyst    Social History Main Topics  . Smoking status: Former  Smoker -- 1.50 packs/day for 15 years    Types: Cigarettes    Quit date: 05/15/2005  . Smokeless tobacco: None     Comment: quit in 2007, smoked for 13 years   . Alcohol Use: No     Comment: quit drinking on 2004---relapse in 2007---QUIT 07/2005  . 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 Sexual Activity: None   Other Topics Concern  . 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  , Disp: , Rfl: ;  buPROPion (WELLBUTRIN SR) 150 MG 12 hr tablet, Take 300 mg by mouth daily. , Disp: , Rfl: ;  Desogestrel-Ethinyl Estradiol (KARIVA PO), Take 1 tablet by mouth daily.  , Disp: , Rfl:  Loratadine-Pseudoephedrine (ALLERGY RELIEF D PO), Take by mouth., Disp: , Rfl: ;  Multiple Vitamin (MULTIVITAMINS PO), Take by mouth., Disp: , Rfl:   EXAM:  Filed Vitals:   10/09/13 0921  BP: 110/82  Pulse: 110  Temp: 97.7 F (36.5 C)    Body mass index is 33.84 kg/(m^2).  GENERAL: vitals reviewed and listed above, alert, oriented, appears well hydrated and in no acute distress  HEENT: atraumatic, conjunttiva clear, no obvious abnormalities on inspection of external nose and ears  NECK: no obvious masses on inspection  LUNGS: clear to auscultation bilaterally, no wheezes, rales or rhonchi, good air movement  CV: HRRR, no peripheral edema  MS: moves all extremities without noticeable abnormality  PSYCH: pleasant and cooperative, no obvious depression or anxiety  ASSESSMENT AND PLAN:  Discussed the following assessment and plan:  Depression  OSA on  CPAP - sees Dr. Jenne CampusMcQueen in ENT -We reviewed the PMH, PSH, FH, SH, Meds and Allergies. -We provided refills for any medications we will prescribe as needed. -We addressed current concerns per orders and patient instructions. -We have asked for records for pertinent exams, studies, vaccines and notes from previous providers. -We have advised patient to follow up per instructions below. -upreg neg -refilled birth control -she will do labs and tdap at physical  -Patient advised to return or notify a doctor immediately if symptoms worsen or persist or new concerns arise.  Patient Instructions  -We have ordered labs or studies at this visit. It can take up to 1-2 weeks for results and processing. We will contact you with instructions IF your results are abnormal. Normal results will be [facility]. If you have not heard from us or can not find your results in [facility] in 2 weeks please contact our office.  -PLEASE SIGN UP FOR MYCHART TODAY   We recommend the following healthy lifestyle measures: - eat a healthy diet consisting of lots of vegetables, fruits, beans, nuts, seeds, healthy meats such as white chicken and fish and whole grains.  - avoid fried foods, fast food, processed foods, sodas, red meet and other fattening foods.  - get a least 150 minutes of aerobic exercise per week.   Follow up in: next few months for physical      Terressa KoyanagiHannah R. Rooney Swails

## 2013-10-09 NOTE — Telephone Encounter (Signed)
I called the pt and asked about the records release form she completed-as to whether she wanted [place] to send records to Dr Evelene Croon (psychiatrist) or did she want to have Dr Evelene Croon send records to [place].  She stated she was not sure what to do and if we needed the records from the psychiatrist..  I advised her per Dr Selena Batten she does not need to have records from Dr Evelene Croon at this point and I also advised her the urine pregnancy test done today was negative and Garnette Scheuermann was sent to her pharmacy with 11 refills.

## 2014-01-08 ENCOUNTER — Other Ambulatory Visit (HOSPITAL_COMMUNITY)
Admission: RE | Admit: 2014-01-08 | Discharge: 2014-01-08 | Disposition: A | Discharge status: Home / Self Care | Payer: BC Managed Care – PPO | Source: Ambulatory Visit | Attending: Family Medicine | Admitting: Family Medicine

## 2014-01-08 ENCOUNTER — Encounter: Payer: Self-pay | Admitting: Family Medicine

## 2014-01-08 ENCOUNTER — Ambulatory Visit (INDEPENDENT_AMBULATORY_CARE_PROVIDER_SITE_OTHER): Payer: BC Managed Care – PPO | Admitting: Family Medicine

## 2014-01-08 VITALS — BP 104/70 | HR 88 | Temp 98.4°F | Ht 61.0 in | Wt 179.5 lb

## 2014-01-08 DIAGNOSIS — B079 Viral wart, unspecified: Secondary | ICD-10-CM | POA: Diagnosis not present

## 2014-01-08 DIAGNOSIS — N841 Polyp of cervix uteri: Secondary | ICD-10-CM | POA: Diagnosis not present

## 2014-01-08 DIAGNOSIS — E669 Obesity, unspecified: Secondary | ICD-10-CM

## 2014-01-08 DIAGNOSIS — Z0000 Encounter for general adult medical examination without abnormal findings: Secondary | ICD-10-CM

## 2014-01-08 DIAGNOSIS — R300 Dysuria: Secondary | ICD-10-CM | POA: Diagnosis not present

## 2014-01-08 DIAGNOSIS — Z124 Encounter for screening for malignant neoplasm of cervix: Secondary | ICD-10-CM | POA: Diagnosis present

## 2014-01-08 DIAGNOSIS — Z1151 Encounter for screening for human papillomavirus (HPV): Secondary | ICD-10-CM | POA: Diagnosis present

## 2014-01-08 DIAGNOSIS — Z23 Encounter for immunization: Secondary | ICD-10-CM

## 2014-01-08 LAB — POCT URINALYSIS DIPSTICK
BILIRUBIN UA: NEGATIVE
Glucose, UA: NEGATIVE
KETONES UA: NEGATIVE
LEUKOCYTES UA: NEGATIVE
Nitrite, UA: NEGATIVE
PH UA: 6.5
PROTEIN UA: NEGATIVE
SPEC GRAV UA: 1.02
Urobilinogen, UA: 0.2

## 2014-01-08 LAB — LIPID PANEL
CHOL/HDL RATIO: 3
CHOLESTEROL: 188 mg/dL (ref 0–200)
HDL: 61.9 mg/dL (ref >39.00)
NonHDL: 126.1
TRIGLYCERIDES: 219 mg/dL — AB (ref 0.0–149.0)
VLDL: 43.8 mg/dL — AB (ref 0.0–40.0)

## 2014-01-08 LAB — HEMOGLOBIN A1C: Hgb A1c MFr Bld: 5.3 % (ref 4.6–6.5)

## 2014-01-08 LAB — LDL CHOLESTEROL, DIRECT: LDL DIRECT: 110 mg/dL

## 2014-01-08 NOTE — Patient Instructions (Signed)
-  We have ordered labs or studies at this visit. It can take up to 1-2 weeks for results and processing. We will contact you with instructions IF your results are abnormal. Normal results will be released to your [facility]. If you have not heard from [place] or can not find your results in [facility] in 2 weeks please contact our office.  -PLEASE SIGN UP FOR MYCHART TODAY   We recommend the following healthy lifestyle measures: - eat a healthy diet consisting of lots of vegetables, fruits, beans, nuts, seeds, healthy meats such as white chicken and fish and whole grains.  - avoid fried foods, fast food, processed foods, sodas, red meet and other fattening foods.  - get a least 150 minutes of aerobic exercise per week.   Follow up in: 1 month for the wart

## 2014-01-08 NOTE — Progress Notes (Signed)
Pre visit review using our clinic review tool, if applicable. No additional management support is needed unless otherwise documented below in the visit note. 

## 2014-01-08 NOTE — Addendum Note (Signed)
Addended by: Johnella Moloney on: 01/08/2014 09:44 AM   Modules accepted: Orders

## 2014-01-08 NOTE — Addendum Note (Signed)
Addended by: Johnella Moloney on: 01/08/2014 09:43 AM   Modules accepted: Orders

## 2014-01-08 NOTE — Progress Notes (Signed)
No chief complaint on file.   HPI:  Here for CPE:  -Concerns and/or follow up today:   1)Dysria: -for a few weeks -pressure, urine darker a few times, dysuria at times -denies: fevers, nausea, vomiting, flank pain  2) Wart on L thumb: -frozen and before -wants to freeze again today   -Diet: variety of foods, balance and well rounded, larger portion size - working to improve  -Exercise: no regular exercise  -Taking folic acid, vitamin D or calcium: no  -Diabetes and Dyslipidemia Screening: doing today  -Hx of HTN: no  -Vaccines: UTD  -pap history: normal pap 06/2012 - hx abnormal with LEEP om 2009, reports yearly and normal since  -FDLMP: a few weeks ago  -sexual activity: yes, female partner, no new partners  -wants STI testing: no  -FH breast, colon or ovarian ca: see FH Last mammogram: N/A Last colon cancer screening: N/A  -Alcohol, Tobacco, drug use: see social history  Review of Systems - no fevers, unintentional weight loss, vision loss, hearing loss, chest pain, sob, hemoptysis, melena, hematochezia, hematuria, genital discharge, changing or concerning skin lesions, bleeding, bruising, loc, thoughts of self harm or SI  Past Medical History  Diagnosis Date  . Hep C w/ coma, chronic     hep c tx successful  . Asthma   . Depression (emotion)   . Abnormal pap     w LEEP  . Seasonal allergies     scratch test in 2006///mold issue in house  . Blood in stool   . Frequent headaches   . OSA on CPAP - sees Dr. Jenne Campus in ENT 10/09/2013    Past Surgical History  Procedure Laterality Date  . Leep  2008  . Dilation and curettage of uterus  1996, 2001, 2002  . Nasal fracture surgery  1999    repair    Family History  Problem Relation Age of Onset  . Emphysema Mother   . Heart disease Father   . Cancer Father     unsure??    History   Social History  . Marital Status: Single    Spouse Name: N/A    Number of Children: N/A  . Years of Education: N/A    Occupational History  . Risk analyst    Social History Main Topics  . Smoking status: Former Smoker -- 1.50 packs/day for 15 years    Types: Cigarettes    Quit date: 05/15/2005  . Smokeless tobacco: None     Comment: quit in 2007, smoked for 13 years   . Alcohol Use: No     Comment: quit drinking on 2004---relapse in 2007---QUIT 07/2005  . 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 Sexual Activity: None   Other Topics Concern  . 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  , Disp: , Rfl: ;  buPROPion (WELLBUTRIN SR) 150 MG 12 hr tablet, Take 300 mg by mouth daily. ,  Disp: , Rfl:  desogestrel-ethinyl estradiol (KARIVA) 0.15-0.02/0.01 MG (21/5) tablet, Take 1 tablet by mouth daily., Disp: 1 Package, Rfl: 11;  Loratadine-Pseudoephedrine (ALLERGY RELIEF D PO), Take by mouth., Disp: , Rfl: ;  Multiple Vitamin (MULTIVITAMINS PO), Take by mouth., Disp: , Rfl:   EXAM:  Filed Vitals:   01/08/14 0840  BP: 104/70  Pulse: 88  Temp: 98.4 F (36.9 C)  Body mass index is 33.93 kg/(m^2).   GENERAL: vitals reviewed and listed below, alert, oriented, appears well hydrated and in no acute distress  HEENT: head atraumatic, PERRLA, normal appearance of eyes, ears, nose and mouth. moist mucus membranes.  NECK: supple, no masses or lymphadenopathy  LUNGS: clear to auscultation bilaterally, no rales, rhonchi or wheeze  CV: HRRR, no peripheral edema or cyanosis, normal pedal pulses  BREAST: normal appearance - no lesions or discharge, on  palpation normal breast tissue without any suspicious masses  ABDOMEN: bowel sounds normal, soft, non tender to palpation, no masses, no rebound or guarding  GU: normal appearance of external genitalia - no lesions or masses, normal vaginal mucosa - no abnormal discharge, normal appearance of cervix except polyp cervical oz - somewhat friable with small amount of bleeding with pap - no abnormal discharge, no masses or tenderness  RECTAL: refused  SKIN: no rash or abnormal lesions except for wart on R hand  MS: normal gait, moves all extremities normally  NEURO: CN II-XII grossly intact, normal muscle strength and sensation to light touch on extremities  PSYCH: normal affect, pleasant and cooperative  ASSESSMENT AND PLAN:  Discussed the following assessment and plan:  Encounter for annual health examination - Plan: Lipid panel, Hemoglobin A1c -Discussed and advised all [place] preventive services health task force level A and B recommendations for age, sex and risks.  -Advised at least 150 minutes of exercise per week and a healthy diet low in saturated fats and sweets and consisting of fresh fruits and vegetables, lean meats such as fish and white chicken and whole grains.  -FASTING labs, studies and vaccines per orders this encounter  Viral wart on right thumb -disucssed options -she opted for LN2, informed consent, freeze thaw x3, care instructions, f/u in 1 month  Cervical polyp - Plan: Ambulatory referral to Gynecology -we discussed possible serious and likely etiologies, workup and treatment, treatment risks and return precautions -after this discussion, Kerina opted for evaluation with gyn for removal -of course, we advised Janene  to return or notify a doctor immediately if symptoms worsen or persist or new concerns arise. -some mild bleeding after exam as expected - return and emergent precautions  Dysuria -udip and culture - will expect some blood from vaginal source  Obesity,  unspecified - Plan: Lipid panel, Hemoglobin A1c  -Discussed and advised all [place] preventive services health task force level A and B recommendations for age, sex and risks.  -Advised at least 150 minutes of exercise per week and a healthy diet low in saturated fats and sweets and consisting of fresh fruits and vegetables, lean meats such as fish and white chicken and whole grains.  -labs, studies and vaccines per orders this encounter  Orders Placed This Encounter  Procedures  . Lipid panel  . Hemoglobin A1c  . Ambulatory referral to Gynecology    Referral Priority:  Routine    Referral Type:  Consultation    Referral Reason:  Specialty Services Required    Requested Specialty:  Gynecology    Number of Visits Requested:  1  Patient advised to return to clinic immediately if symptoms worsen or persist or new concerns.  Patient Instructions  -We have ordered labs or studies at this visit. It can take up to 1-2 weeks for results and processing. We will contact you with instructions IF your results are abnormal. Normal results will be released to your [facility]. If you have not heard from [place] or can not find your results in [facility] in 2 weeks please contact our office.  -PLEASE SIGN UP FOR MYCHART TODAY   We recommend the following healthy lifestyle measures: - eat a healthy diet consisting of lots of vegetables, fruits, beans, nuts, seeds, healthy meats such as white chicken and fish and whole grains.  - avoid fried foods, fast food, processed foods, sodas, red meet and other fattening foods.  - get a least 150 minutes of aerobic exercise per week.   Follow up in: 1 month for the wart     No Follow-up on file.  Kriste Basque R.

## 2014-01-09 LAB — CYTOLOGY - PAP

## 2014-01-10 LAB — CULTURE, URINE COMPREHENSIVE: Colony Count: 10000

## 2014-02-02 ENCOUNTER — Encounter: Payer: Self-pay | Admitting: Gynecology

## 2014-02-02 ENCOUNTER — Telehealth: Payer: Self-pay | Admitting: Gynecology

## 2014-02-02 ENCOUNTER — Ambulatory Visit (INDEPENDENT_AMBULATORY_CARE_PROVIDER_SITE_OTHER): Payer: BC Managed Care – PPO | Admitting: Gynecology

## 2014-02-02 VITALS — BP 110/62 | HR 80 | Resp 12 | Ht 61.0 in | Wt 181.0 lb

## 2014-02-02 DIAGNOSIS — Z9889 Other specified postprocedural states: Secondary | ICD-10-CM

## 2014-02-02 DIAGNOSIS — Z3009 Encounter for other general counseling and advice on contraception: Secondary | ICD-10-CM

## 2014-02-02 DIAGNOSIS — N841 Polyp of cervix uteri: Secondary | ICD-10-CM

## 2014-02-02 DIAGNOSIS — B182 Chronic viral hepatitis C: Secondary | ICD-10-CM

## 2014-02-02 MED ORDER — MISOPROSTOL 200 MCG PO TABS: ORAL_TABLET | ORAL | Status: DC

## 2014-02-02 NOTE — Progress Notes (Signed)
38 y.o. Married Caucasian female  [id number] here for annual exam. Pt is currently sexually active.  Pt referred for cervical polyp noted on PE with PCP.  Pt is not interested in pregnancy in near future. She has been on ocp for 7y, pt has a history of Hepatitis C that was treated   Patient's last menstrual period was 01/19/2014.          Sexually active: Yes.    The current method of family planning is OCP (estrogen/progesterone).    Exercising: No.  The patient does not participate in regular exercise at present. Last pap: 01/08/14 NEG HR HPV Abnormal Pap: 2010/2011 had Leep-pathology? Alcohol: No Tobacco: No BSE: occasionally   Labs: Kriste Basque, MD   Health Maintenance  Topic Date Due  . Influenza Vaccine  12/14/2014  . Pap Smear  01/08/2017  . Tetanus/tdap  01/09/2024    Family History  Problem Relation Age of Onset  . Emphysema Mother   . Heart disease Father   . Cancer Father     unsure??  . Stroke Maternal Grandmother   . Hypertension Father   . Osteoporosis Maternal Grandmother     Patient Active Problem List   Diagnosis Date Noted  . Hepatitis C, chronic   . Depression 10/09/2013  . OSA on CPAP - sees Dr. Jenne Campus in ENT 10/09/2013  . Allergic rhinitis 05/30/2012  . Cough variant asthma 02/08/2012    Past Medical History  Diagnosis Date  . Hepatitis C, chronic     hep c tx successful  . Asthma   . Depression (emotion)   . Abnormal pap     w LEEP  . Seasonal allergies     scratch test in 2006///mold issue in house  . Blood in stool   . Frequent headaches   . OSA on CPAP - sees Dr. Jenne Campus in ENT 10/09/2013    Past Surgical History  Procedure Laterality Date  . Leep  2008  . Dilation and curettage of uterus  1996, 2001, 2002  . Nasal fracture surgery  1999    repair    Allergies: Cefaclor  Current Outpatient Prescriptions  Medication Sig Dispense Refill  . albuterol (PROVENTIL HFA;VENTOLIN HFA) 108 (90 BASE) MCG/ACT inhaler Inhale 2 puffs into the  lungs every 6 (six) hours as needed.      . ARIPiprazole (ABILIFY) 15 MG tablet Take 15 mg by mouth daily.        Marland Kitchen buPROPion (WELLBUTRIN SR) 150 MG 12 hr tablet Take 300 mg by mouth daily.       Marland Kitchen desogestrel-ethinyl estradiol (KARIVA) 0.15-0.02/0.01 MG (21/5) tablet Take 1 tablet by mouth daily.  1 Package  11  . Loratadine-Pseudoephedrine (ALLERGY RELIEF D PO) Take by mouth.      . Multiple Vitamin (MULTIVITAMINS PO) Take by mouth.       No current facility-administered medications for this visit.    ROS: Pertinent items are noted in HPI.  Exam:    BP 110/62  Pulse 80  Resp 12  Ht  (1.549 m)  Wt 181 lb (82.101 kg)  BMI 34.22 kg/m2  LMP 01/19/2014 Weight change: @ Last 3 height recordings:  Ht Readings from Last 3 Encounters:  02/02/14  (1.549 m)  01/08/14  (1.549 m)  10/09/13  (1.549 m)   General appearance: alert, cooperative and appears stated age Extremities: extremities normal, atraumatic, no cyanosis or edema Skin: Skin color, texture, turgor normal. No rashes or lesions Lymph  nodes: .no inguinal nodes palpated Neurologic: Grossly normal   Pelvic: External genitalia:  no lesions              Urethra: normal appearing urethra with no masses, tenderness or lesions              Bartholins and Skenes: Bartholin's, Urethra, Skene's normal                 Vagina: normal appearing vagina with normal color and discharge, no lesions              Cervix: friable to light touch after cervical mucous wiped off, excessive bleeding  Questionable polyp vs scar from LEEP, os tight,                                                     1. Cervical polyp Cervix very friable, polyp grasped with forcep and portion removed, Kevorkian ECC done for what appeared to be base, moderate amount of bleeding treated with pressure and monsels, hemostatic  tolerated well. Pelvic rest for 5d,  F/u 7-10d Questions addressed    2. Chronic hepatitis C without  hepatic coma Pt now with recent negative HCV titres, h/o drug use,   3. Other general counseling and advice for contraceptive management discussed non-oral non-hepatic methods of contraception, pt is unsure if she will have children, reviewed skyla IUD, mode of action reviewed, pt would like to have placed Due to nature of cervical bleeding and measures to stop, will complete pelvic exam at f/u prior to having IUD placed  4.  H/o LEEP Reviewed pap recommendation after LEEP, will try to get pathology to determine recommended f/u, last pap with negative HPV  An After Visit Summary was printed and given to the patient.

## 2014-02-02 NOTE — Telephone Encounter (Signed)
Patient needs a 10 day recheck appointment with Dr.Lathrop 02/13/14. No appointments available.

## 2014-02-03 NOTE — Telephone Encounter (Signed)
Dr. Farrel Gobble can you review and advised, when would be a good time for patient to return for recheck?

## 2014-02-03 NOTE — Telephone Encounter (Signed)
Patient called to confirm appointment for 02/11/14.

## 2014-02-03 NOTE — Telephone Encounter (Signed)
Patient confirmed, will update Dr. Farrel Gobble and Routing to provider for final review. Patient agreeable to disposition. Will close encounter

## 2014-02-03 NOTE — Telephone Encounter (Signed)
9:15 or 10:15 should work for appt

## 2014-02-03 NOTE — Telephone Encounter (Signed)
Message left on mobile to advise follow up was scheduled on 02/11/14 at 1030 with Dr. Farrel Gobble. Okay to leave detailed message per designated party release form.   If this appointment will not work, please call us to let us know. Can r/s. Patient was to have follow up in 7 to 10 days.   Please r/s if this day/time does not work for patient.

## 2014-02-04 LAB — IPS OTHER TISSUE BIOPSY

## 2014-02-05 ENCOUNTER — Telehealth: Payer: Self-pay | Admitting: Gynecology

## 2014-02-05 NOTE — Telephone Encounter (Signed)
Call to patient to go over iud benefits. Person answered and hung up

## 2014-02-05 NOTE — Telephone Encounter (Signed)
Returning a call to Nicole [name]. °

## 2014-02-06 NOTE — Telephone Encounter (Signed)
Patient returning call.

## 2014-02-09 NOTE — Telephone Encounter (Signed)
Spoke with patient. Advised of results as seen below. Patient is agreeable and verbalizes understanding. Advised patient will be responsible for $30 when she comes in for IUD insertion. Patient will call back with first day of cycle to schedule IUD insertion.   Routing to provider for final review. Patient agreeable to disposition. Will close encounter

## 2014-02-09 NOTE — Telephone Encounter (Signed)
Message copied by Jannet Askew on Mon Feb 09, 2014  4:40 PM ------      Message from: Douglass Rivers      Created: Mon Feb 09, 2014  4:14 PM       Inform benign, although polyp looking, it was artifact from the LEEP ------

## 2014-02-11 ENCOUNTER — Ambulatory Visit (INDEPENDENT_AMBULATORY_CARE_PROVIDER_SITE_OTHER): Payer: BC Managed Care – PPO | Admitting: Gynecology

## 2014-02-11 VITALS — BP 110/80 | HR 68 | Resp 12 | Ht 61.0 in | Wt 180.0 lb

## 2014-02-11 DIAGNOSIS — Z9889 Other specified postprocedural states: Secondary | ICD-10-CM

## 2014-02-11 DIAGNOSIS — L929 Granulomatous disorder of the skin and subcutaneous tissue, unspecified: Secondary | ICD-10-CM

## 2014-02-11 DIAGNOSIS — L918 Other hypertrophic disorders of the skin: Secondary | ICD-10-CM

## 2014-02-11 NOTE — Progress Notes (Signed)
Subjective:     Patient ID: Nicole [name], female   DOB: Oct 03, 1975, 38 y.o.   MRN: [id number]  HPI Comments: Pt returns for f/u after treatment for extensive granulation tissue from prior LEEP procedure and bleeding that obstructed ability for pelvic exam. Pt reports doing well after treatment, min bleeding afterwards but none since. She has not been sexually active    Review of Systems Per HPI    Objective:   Physical Exam  Constitutional: She is oriented to person, place, and time. She appears well-developed and well-nourished.  Genitourinary: Vagina normal and uterus normal. There is no rash or tenderness on the right labia. There is no rash or tenderness on the left labia. Uterus is not enlarged and not tender. Cervix exhibits friability (minimal to touch on right). Cervix exhibits no motion tenderness and no discharge. Right adnexum displays no mass and no tenderness. Left adnexum displays no mass, no tenderness and no fullness. No vaginal discharge found.  Neurological: She is alert and oriented to person, place, and time.       Assessment:     Normal pelvic exam Granulation tissue after LEEP     Plan:     Doing well, pt is intereested on having IUD placed for contraception Most of granulation treated, small area on right treated with AgNO3 with good affect, may resume usual activities

## 2014-03-04 ENCOUNTER — Ambulatory Visit: Payer: BC Managed Care – PPO | Admitting: Internal Medicine

## 2014-03-11 ENCOUNTER — Telehealth: Payer: Self-pay | Admitting: Gynecology

## 2014-03-11 NOTE — Telephone Encounter (Signed)
Left message for patient to call back. °Need to go over IUD benefits with patient. °

## 2014-03-16 ENCOUNTER — Encounter: Payer: Self-pay | Admitting: Gynecology

## 2014-06-02 ENCOUNTER — Ambulatory Visit (INDEPENDENT_AMBULATORY_CARE_PROVIDER_SITE_OTHER): Payer: 59 | Admitting: Family Medicine

## 2014-06-02 ENCOUNTER — Encounter: Payer: Self-pay | Admitting: Family Medicine

## 2014-06-02 VITALS — BP 100/78 | HR 95 | Temp 98.0°F | Ht 61.0 in | Wt 183.1 lb

## 2014-06-02 DIAGNOSIS — R197 Diarrhea, unspecified: Secondary | ICD-10-CM

## 2014-06-02 DIAGNOSIS — R1011 Right upper quadrant pain: Secondary | ICD-10-CM

## 2014-06-02 DIAGNOSIS — R112 Nausea with vomiting, unspecified: Secondary | ICD-10-CM

## 2014-06-02 LAB — CBC WITH DIFFERENTIAL/PLATELET
Basophils Absolute: 0 10*3/uL (ref 0.0–0.1)
Basophils Relative: 0.5 % (ref 0.0–3.0)
EOS PCT: 1.1 % (ref 0.0–5.0)
Eosinophils Absolute: 0.1 10*3/uL (ref 0.0–0.7)
HCT: 45.8 % (ref 36.0–46.0)
HEMOGLOBIN: 15.7 g/dL — AB (ref 12.0–15.0)
LYMPHS ABS: 3.8 10*3/uL (ref 0.7–4.0)
Lymphocytes Relative: 38.7 % (ref 12.0–46.0)
MCHC: 34.3 g/dL (ref 30.0–36.0)
MCV: 90 fl (ref 78.0–100.0)
Monocytes Absolute: 0.8 10*3/uL (ref 0.1–1.0)
Monocytes Relative: 7.7 % (ref 3.0–12.0)
NEUTROS PCT: 52 % (ref 43.0–77.0)
Neutro Abs: 5.2 10*3/uL (ref 1.4–7.7)
PLATELETS: 349 10*3/uL (ref 150.0–400.0)
RBC: 5.09 Mil/uL (ref 3.87–5.11)
RDW: 12.4 % (ref 11.5–15.5)
WBC: 9.9 10*3/uL (ref 4.0–10.5)

## 2014-06-02 LAB — COMPREHENSIVE METABOLIC PANEL
ALBUMIN: 4.1 g/dL (ref 3.5–5.2)
ALK PHOS: 38 U/L — AB (ref 39–117)
ALT: 22 U/L (ref 0–35)
AST: 18 U/L (ref 0–37)
BUN: 10 mg/dL (ref 6–23)
CO2: 27 mEq/L (ref 19–32)
CREATININE: 1.26 mg/dL — AB (ref 0.40–1.20)
Calcium: 9.4 mg/dL (ref 8.4–10.5)
Chloride: 105 mEq/L (ref 96–112)
GFR: 50.9 mL/min — ABNORMAL LOW (ref >60.00)
Glucose, Bld: 78 mg/dL (ref 70–99)
Potassium: 3.9 mEq/L (ref 3.5–5.1)
Sodium: 139 mEq/L (ref 135–145)
Total Bilirubin: 0.4 mg/dL (ref 0.2–1.2)
Total Protein: 7.3 g/dL (ref 6.0–8.3)

## 2014-06-02 NOTE — Patient Instructions (Signed)
BEFORE YOU LEAVE: -labs  -We placed a referral for you as discussed to get the [place]. It usually takes about 1-2 weeks to process and schedule this referral. If you have not heard from us regarding this appointment in 2 weeks please contact our office.  -avoid dairy for next few weeks  -follow up pending labs/US

## 2014-06-02 NOTE — Progress Notes (Addendum)
HPI:  Acute visit for:  RUQ pain: -intermittent RUQ pain, dull ache - occurs a few days per week, lasts about 1 hour - not related to anything she can think of, meals, etc -intermittent, nausea, vomiting a few times, intermittent diarrhea - normal bowels in between -denies: fevers, blood in stools, melena, bilious or bloody emesis, weight loss, heartburn, reflux -no new foods or medicine -had brief symptoms like this in 2013 and reports told could have passed gallstone, US neg -she saw Dr. Dickie LaBrody in the past for abd pain and blood in stool and had colonoscopy and EGD for gastroenteritis  ROS: See pertinent positives and negatives per HPI.  Past Medical History  Diagnosis Date  . Hepatitis C, chronic     hep c tx successful  . Asthma   . Depression (emotion)   . Abnormal pap     w LEEP  . Seasonal allergies     scratch test in 2006///mold issue in house  . Blood in stool   . Frequent headaches   . OSA on CPAP - sees Dr. Jenne CampusMcQueen in ENT 10/09/2013    Past Surgical History  Procedure Laterality Date  . Leep  2008  . Dilation and curettage of uterus  1996, 2001, 2002  . Nasal fracture surgery  1999    repair    Family History  Problem Relation Age of Onset  . Emphysema Mother   . Heart disease Father   . Cancer Father     unsure??  . Stroke Maternal Grandmother   . Hypertension Father   . Osteoporosis Maternal Grandmother     History   Social History  . Marital Status: Single    Spouse Name: N/A    Number of Children: N/A  . 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 Smoking status: Former Smoker -- 1.50 packs/day for 15 years    Types: Cigarettes    Quit date: 05/15/2005  . Smokeless tobacco: None     Comment: quit in 2007, smoked for 13 years   . Alcohol Use: No     Comment: quit drinking on 2004---relapse in 2007---QUIT 07/2005  . Drug Use: Yes     Comment: IV drug use///street drugs///inhalants   QUIT 07/2005  . Sexual Activity: None   Other Topics Concern  . None   Social History Narrative   Work or School: Glass blower/designergraphic designer      Home Situation: lives with husband      Spiritual Beliefs: none      Lifestyle: no regular exercise; diet is fair                 Current outpatient prescriptions:  .  albuterol (PROVENTIL HFA;VENTOLIN HFA) 108 (90 BASE) MCG/ACT inhaler, Inhale 2 puffs into the lungs every 6 (six) hours as needed., Disp: , Rfl:  .  ARIPiprazole (ABILIFY) 15 MG tablet, Take 15 mg by mouth daily.  , Disp: , Rfl:  .  buPROPion (WELLBUTRIN SR) 150 MG 12 hr tablet, Take 300 mg by mouth daily. , Disp: , Rfl:  .  desogestrel-ethinyl estradiol (KARIVA) 0.15-0.02/0.01 MG (21/5) tablet, Take 1 tablet by mouth daily., Disp: 1 Package, Rfl: 11 .  Loratadine-Pseudoephedrine (ALLERGY RELIEF D PO), Take by mouth., Disp: , Rfl:  .  Multiple Vitamin (MULTIVITAMINS PO), Take by mouth., Disp: , Rfl:   EXAM:  Filed Vitals:   06/02/14 1510  BP: 100/78  Pulse: 95  Temp: 98 F (36.7 C)    Body mass index is 34.61 kg/(m^2).  GENERAL: vitals reviewed and listed above, alert, oriented, appears well hydrated and in no acute distress  HEENT: atraumatic, conjunttiva clear, no obvious abnormalities on inspection of external nose and ears  NECK: no obvious masses on inspection  LUNGS: clear to auscultation bilaterally, no wheezes, rales or rhonchi, good air movement  CV: HRRR, no peripheral edema  ABD: BS+, soft, NTTP  MS: moves all extremities without noticeable abnormality  PSYCH: pleasant and cooperative, no obvious depression or anxiety  ASSESSMENT AND PLAN:  Discussed the following assessment and plan:  RUQ abdominal pain - Plan: US Abdomen Limited RUQ, CMP, CBC with Differential, H. pylori Antibody, IgG  Non-intractable vomiting with nausea, vomiting of unspecified type - Plan: CMP, CBC with Differential, t-Transglutaminase (tTG) IgG  Diarrhea - Plan:  t-Transglutaminase (tTG) IgG  -we discussed possible serious and likely etiologies, workup and treatment, treatment risks and return precautions -after this discussion, Cambreigh opted for labs and RUQ [place], likely will see gI if unrevealing -follow up advised pending labs -of course, we advised [place]  to return or notify a doctor immediately if symptoms worsen or persist or new concerns arise.   -Patient advised to return or notify a doctor immediately if symptoms worsen or persist or new concerns arise.  Patient Instructions  BEFORE YOU LEAVE: -labs  -We placed a referral for you as discussed to get the [place]. It usually takes about 1-2 weeks to process and schedule this referral. If you have not heard from [place] regarding this appointment in 2 weeks please contact our office.  -avoid dairy for next few weeks  -follow up pending labs/US      Kriste Basque R.

## 2014-06-02 NOTE — Progress Notes (Signed)
Pre visit review using our clinic review tool, if applicable. No additional management support is needed unless otherwise documented below in the visit note. 

## 2014-06-03 LAB — H. PYLORI ANTIBODY, IGG: H Pylori IgG: NEGATIVE

## 2014-06-04 ENCOUNTER — Other Ambulatory Visit: Payer: Self-pay | Admitting: *Deleted

## 2014-06-04 DIAGNOSIS — R899 Unspecified abnormal finding in specimens from other organs, systems and tissues: Secondary | ICD-10-CM

## 2014-06-05 ENCOUNTER — Other Ambulatory Visit: Payer: 59

## 2014-06-09 ENCOUNTER — Other Ambulatory Visit (INDEPENDENT_AMBULATORY_CARE_PROVIDER_SITE_OTHER): Payer: 59

## 2014-06-09 DIAGNOSIS — R899 Unspecified abnormal finding in specimens from other organs, systems and tissues: Secondary | ICD-10-CM

## 2014-06-10 LAB — BASIC METABOLIC PANEL
BUN: 11 mg/dL (ref 6–23)
CO2: 26 meq/L (ref 19–32)
CREATININE: 1.04 mg/dL (ref 0.40–1.20)
Calcium: 9 mg/dL (ref 8.4–10.5)
Chloride: 105 mEq/L (ref 96–112)
GFR: 63.5 mL/min (ref >60.00)
GLUCOSE: 101 mg/dL — AB (ref 70–99)
Potassium: 3.6 mEq/L (ref 3.5–5.1)
SODIUM: 140 meq/L (ref 135–145)

## 2014-06-11 LAB — URINE CULTURE: Colony Count: 6000

## 2014-06-12 ENCOUNTER — Ambulatory Visit
Admission: RE | Admit: 2014-06-12 | Discharge: 2014-06-12 | Disposition: A | Discharge status: Home / Self Care | Payer: 59 | Source: Ambulatory Visit | Attending: Family Medicine | Admitting: Family Medicine

## 2014-06-12 DIAGNOSIS — R1011 Right upper quadrant pain: Secondary | ICD-10-CM

## 2014-07-21 ENCOUNTER — Encounter: Payer: Self-pay | Admitting: Family Medicine

## 2014-07-21 MED ORDER — LEVONORGESTREL-ETHINYL ESTRAD 0.15-30 MG-MCG PO TABS: 1.0000 | ORAL_TABLET | Freq: Every day | ORAL | Status: DC

## 2014-07-21 NOTE — Telephone Encounter (Signed)
Rx done and patient informed via Mychart. 

## 2014-09-04 NOTE — Op Note (Signed)
PATIENT NAME:  Nicole [name], Nicole [name] MR#:  [id number] DATE OF BIRTH:  [birth year]  DATE OF PROCEDURE:  08/20/2012  PREOPERATIVE DIAGNOSIS: Nasal obstruction.   POSTOPERATIVE DIAGNOSIS: Nasal obstruction.   ATTENDING SURGEON: Linus Salmonshapman Blaike Vickers, MD  OPERATIONS PERFORMED: 1.  Nasal septoplasty.  2.  Bilateral submucous resection, inferior turbinates.   OPERATIVE FINDINGS: Severe left septal deformity; bilateral inferior turbinate hypertrophy.   DESCRIPTION OF THE PROCEDURE:  The patient was identified in the holding area and taken to the operating room and placed in the supine position.  After general endotracheal anesthesia was induced, the table was turned 45 degrees and the patient was placed in a semi-Fowler position.  The nose was then topically anesthetized with Lidocaine, cotton pledgets were placed within each nostril. After approximately 5 minutes, this was removed at which time a local anesthetic of 1% Lidocaine 1:100,000 units of Epinephrine was used to inject the inferior turbinates in the nasal septum. A total of 11 ml was used. Examination of the nose showed a severe left nasal septal deformity and tremendous hypertrophied inferior turbinate.  Beginning on the right hand side a hemitransfixion incision was then created on the leading edge of the septum on the right.  A subperichondrial plane was elevated posteriorly on the left and taken back to the perpendicular plate of the ethmoid where subperiosteal plane was elevated posteriorly on the left. A large septal spur was identified on the left-hand side impacting on the inferior turbinate.  An inferior rim of cartilage was removed anteriorly with care taken to leave an anterior strut to prevent nasal collapse. With this strut removed the perpendicular plate of the ethmoid was separated from the quadrangular cartilage. The large septal spur was removed.  The septum was then replaced in the midline. Reinspection through each nostril showed excellent  reduction of the septal deformity. A left posterior inferior fenestration was then created to allow hematoma drainage.  With the septoplasty completed, beginning on the left-hand side, a 15 blade was used to incise along the inferior edge of the inferior turbinate. A superior laterally based flap was then elevated. The underlying conchal bone of mucosa was excised using Knight scissors. The flap was then laid back over the turbinate stump and cauterized using suction cautery. In a similar fashion the submucous resection was performed on the right.  With the submucous resection completed bilaterally and no active bleeding, the hemitransfixion incision was then closed using two interrupted 3-0 chromic sutures.  Plastic nasal septal splints were placed within each nostril and affixed to the septum using a 3-0 nylon suture. Stammberger gel was then used beneath each inferior turbinate for hemostasis.   The patient tolerated the procedure well, was returned to anesthesia, extubated in the operating room, and taken to the recovery room in stable condition.    CULTURES:  None.  SPECIMENS:  None.  ESTIMATED BLOOD LOSS:  Less than 20 mL.    ____________________________ Davina Pokehapman T. Narissa Beaufort, MD ctm:dm D: 08/20/2012 08:11:00 ET T: 08/20/2012 08:35:22 ET JOB#: [id number]  cc: Davina Pokehapman T. Jemell Town, MD, <Dictator> Davina PokeHAPMAN T Jhase Creppel MD ELECTRONICALLY SIGNED 09/13/2012 7:50

## 2014-10-05 ENCOUNTER — Ambulatory Visit (INDEPENDENT_AMBULATORY_CARE_PROVIDER_SITE_OTHER): Payer: 59 | Admitting: Family Medicine

## 2014-10-05 ENCOUNTER — Encounter: Payer: Self-pay | Admitting: Family Medicine

## 2014-10-05 ENCOUNTER — Encounter: Payer: Self-pay | Admitting: Physician Assistant

## 2014-10-05 ENCOUNTER — Telehealth: Payer: Self-pay | Admitting: Family Medicine

## 2014-10-05 VITALS — BP 98/80 | HR 106 | Temp 97.4°F | Ht 61.0 in | Wt 187.2 lb

## 2014-10-05 DIAGNOSIS — R194 Change in bowel habit: Secondary | ICD-10-CM

## 2014-10-05 DIAGNOSIS — K529 Noninfective gastroenteritis and colitis, unspecified: Secondary | ICD-10-CM | POA: Diagnosis not present

## 2014-10-05 MED ORDER — ONDANSETRON HCL 4 MG PO TABS: 4.0000 mg | ORAL_TABLET | Freq: Three times a day (TID) | ORAL | Status: DC | PRN

## 2014-10-05 NOTE — Telephone Encounter (Signed)
Patient was seen today by Dr. Selena BattenKim and would like to know what to do for stomach cramps?  Patient would like a callback.

## 2014-10-05 NOTE — Patient Instructions (Addendum)
-  plenty of fluids with electrolytes - soup broth, gatorade, etc  -imodium as needed for diarrhea  -zofran as needed for the nausea  -no dairy for 7 days  -prilosec 20mg  daily for 2 weeks (over the counter)  -align probiotic for 2 weeks (over the counter)  -We placed a referral for you as discussed for your chronic issues and changes in bowels. It usually takes about 1-2 weeks to process and schedule this referral. If you have not heard from us regarding this appointment in 2 weeks please contact our office.

## 2014-10-05 NOTE — Telephone Encounter (Signed)
Patient informed. 

## 2014-10-05 NOTE — Progress Notes (Signed)
HPI:  Acute visit for:  N/V/D: -started: 3 days ago -symptoms: nasal congestion, diarrhea - watery, nausea, vomiting, low grade fever, body aches -denies:  Hematochezia, melena, fevers > 100, focal abd pain, inability to tolerate fluids -has had some intermittent diarrhea for several months, loose bowel, nausea chronically - does plan to get in to see Dr. Dickie LaBrody for this -no recent travel, no abx recently, hx treated hep c -was around a child recently that was sick -FDLMP: Sep 26 2014  ROS: See pertinent positives and negatives per HPI.  Past Medical History  Diagnosis Date  . Hepatitis C, chronic     hep c tx successful  . Asthma   . Depression (emotion)   . Abnormal pap     w LEEP  . Seasonal allergies     scratch test in 2006///mold issue in house  . Blood in stool   . Frequent headaches   . OSA on CPAP - sees Dr. Jenne CampusMcQueen in ENT 10/09/2013    Past Surgical History  Procedure Laterality Date  . Leep  2008  . Dilation and curettage of uterus  1996, 2001, 2002  . Nasal fracture surgery  1999    repair    Family History  Problem Relation Age of Onset  . Emphysema Mother   . Heart disease Father   . Cancer Father     unsure??  . Stroke Maternal Grandmother   . Hypertension Father   . Osteoporosis Maternal Grandmother     History   Social History  . Marital Status: Single    Spouse Name: N/A  . Number of Children: N/A  . 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 Smoking status: Former Smoker -- 1.50 packs/day for 15 years    Types: Cigarettes    Quit date: 05/15/2005  . Smokeless tobacco: Not on file     Comment: quit in 2007, smoked for 13 years   . Alcohol Use: No     Comment: quit drinking on 2004---relapse in 2007---QUIT 07/2005  . 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 Sexual Activity: Not on file   Other Topics Concern  . None   Social  History Narrative   Work or School: Glass blower/designergraphic designer      Home Situation: lives with husband      Spiritual Beliefs: none      Lifestyle: no regular exercise; diet is fair                 Current outpatient prescriptions:  .  albuterol (PROVENTIL HFA;VENTOLIN HFA) 108 (90 BASE) MCG/ACT inhaler, Inhale 2 puffs into the lungs every 6 (six) hours as needed., Disp: , Rfl:  .  ARIPiprazole (ABILIFY) 15 MG tablet, Take 15 mg by mouth daily.  , Disp: , Rfl:  .  buPROPion (WELLBUTRIN SR) 150 MG 12 hr tablet, Take 300 mg by mouth daily. , Disp: , Rfl:  .  levonorgestrel-ethinyl estradiol (NORDETTE) 0.15-30 MG-MCG tablet, Take 1 tablet by mouth daily., Disp: , Rfl:  .  Loratadine-Pseudoephedrine (ALLERGY RELIEF D PO), Take by mouth., Disp: , Rfl:  .  Multiple Vitamin (MULTIVITAMINS PO), Take by mouth., Disp: , Rfl:  .  ondansetron (ZOFRAN) 4 MG tablet, Take 1 tablet (4 mg total) by mouth every 8 (eight) hours as needed for nausea or vomiting., Disp: 20 tablet, Rfl: 0  EXAM:  Filed Vitals:   10/05/14 1017  BP: 98/80  Pulse: 106  Temp: 97.4 F (36.3 C)    Body mass index is 35.39 kg/(m^2).  GENERAL: vitals reviewed and listed above, alert, oriented, appears well hydrated and in no acute distress  HEENT: atraumatic, conjunttiva clear, no obvious abnormalities on inspection of external nose and ears  NECK: no obvious masses on inspection  LUNGS: clear to auscultation bilaterally, no wheezes, rales or rhonchi, good air movement  ABD: BS+, soft, NTTP  CV: HRRR, no peripheral edema  MS: moves all extremities without noticeable abnormality  PSYCH: pleasant and cooperative, no obvious depression or anxiety  ASSESSMENT AND PLAN:  Discussed the following assessment and plan:  Gastroenteritis - Plan: ondansetron (ZOFRAN) 4 MG tablet  Change in bowel habits - Plan: ondansetron (ZOFRAN) 4 MG tablet, Ambulatory referral to Gastroenterology  -acute symptoms more likely viral given  other symptoms - tx per instructions and orders with return and emergency precautions discussed -refer GI per her request for chronic issues  -Patient advised to return or notify a doctor immediately if symptoms worsen or persist or new concerns arise.  Patient Instructions  -plenty of fluids with electrolytes - soup broth, gatorade, etc  -imodium as needed for diarrhea  -zofran as needed for the nausea  -no dairy for 7 days  -We placed a referral for you as discussed for your chronic issues and changes in bowels. It usually takes about 1-2 weeks to process and schedule this referral. If you have not heard from [place] regarding this appointment in 2 weeks please contact our office.      Kriste Basque R.

## 2014-10-05 NOTE — Telephone Encounter (Signed)
Advise the medications we advised at the appointment which will help some if a GI bug, [facility]

## 2014-10-05 NOTE — Progress Notes (Signed)
Pre visit review using our clinic review tool, if applicable. No additional management support is needed unless otherwise documented below in the visit note. 

## 2014-10-06 ENCOUNTER — Ambulatory Visit: Payer: Self-pay | Admitting: Family Medicine

## 2014-10-26 ENCOUNTER — Other Ambulatory Visit (INDEPENDENT_AMBULATORY_CARE_PROVIDER_SITE_OTHER): Payer: 59

## 2014-10-26 ENCOUNTER — Ambulatory Visit (INDEPENDENT_AMBULATORY_CARE_PROVIDER_SITE_OTHER): Payer: 59 | Admitting: Physician Assistant

## 2014-10-26 ENCOUNTER — Encounter: Payer: Self-pay | Admitting: Physician Assistant

## 2014-10-26 ENCOUNTER — Telehealth: Payer: Self-pay | Admitting: *Deleted

## 2014-10-26 ENCOUNTER — Ambulatory Visit: Payer: 59 | Admitting: Physician Assistant

## 2014-10-26 VITALS — BP 100/60 | HR 96 | Ht 60.25 in | Wt 188.0 lb

## 2014-10-26 DIAGNOSIS — K589 Irritable bowel syndrome without diarrhea: Secondary | ICD-10-CM

## 2014-10-26 DIAGNOSIS — R1084 Generalized abdominal pain: Secondary | ICD-10-CM

## 2014-10-26 DIAGNOSIS — R110 Nausea: Secondary | ICD-10-CM

## 2014-10-26 DIAGNOSIS — R197 Diarrhea, unspecified: Secondary | ICD-10-CM | POA: Diagnosis not present

## 2014-10-26 LAB — CBC WITH DIFFERENTIAL/PLATELET
Basophils Absolute: 0 10*3/uL (ref 0.0–0.1)
Basophils Relative: 0.5 % (ref 0.0–3.0)
EOS PCT: 2.6 % (ref 0.0–5.0)
Eosinophils Absolute: 0.2 10*3/uL (ref 0.0–0.7)
HEMATOCRIT: 45.5 % (ref 36.0–46.0)
HEMOGLOBIN: 15.5 g/dL — AB (ref 12.0–15.0)
LYMPHS ABS: 2.7 10*3/uL (ref 0.7–4.0)
Lymphocytes Relative: 46.3 % — ABNORMAL HIGH (ref 12.0–46.0)
MCHC: 34.1 g/dL (ref 30.0–36.0)
MCV: 91.4 fl (ref 78.0–100.0)
Monocytes Absolute: 0.5 10*3/uL (ref 0.1–1.0)
Monocytes Relative: 8.8 % (ref 3.0–12.0)
NEUTROS ABS: 2.4 10*3/uL (ref 1.4–7.7)
Neutrophils Relative %: 41.8 % — ABNORMAL LOW (ref 43.0–77.0)
PLATELETS: 292 10*3/uL (ref 150.0–400.0)
RBC: 4.98 Mil/uL (ref 3.87–5.11)
RDW: 13.6 % (ref 11.5–15.5)
WBC: 5.9 10*3/uL (ref 4.0–10.5)

## 2014-10-26 LAB — SEDIMENTATION RATE: SED RATE: 6 mm/h (ref 0–22)

## 2014-10-26 LAB — BASIC METABOLIC PANEL
BUN: 9 mg/dL (ref 6–23)
CALCIUM: 9 mg/dL (ref 8.4–10.5)
CO2: 27 mEq/L (ref 19–32)
Chloride: 106 mEq/L (ref 96–112)
Creatinine, Ser: 1.05 mg/dL (ref 0.40–1.20)
GFR: 62.68 mL/min (ref >60.00)
GLUCOSE: 98 mg/dL (ref 70–99)
Potassium: 3.9 mEq/L (ref 3.5–5.1)
SODIUM: 138 meq/L (ref 135–145)

## 2014-10-26 LAB — HIGH SENSITIVITY CRP: CRP, High Sensitivity: 2.35 mg/L (ref 0.000–5.000)

## 2014-10-26 MED ORDER — GLYCOPYRROLATE 2 MG PO TABS: 2.0000 mg | ORAL_TABLET | Freq: Two times a day (BID) | ORAL | Status: DC

## 2014-10-26 MED ORDER — SACCHAROMYCES BOULARDII 250 MG PO CAPS
250.0000 mg | ORAL_CAPSULE | Freq: Two times a day (BID) | ORAL | Status: DC
Start: 2014-10-26 — End: 2015-12-01

## 2014-10-26 MED ORDER — RIFAXIMIN 550 MG PO TABS: 550.0000 mg | ORAL_TABLET | Freq: Three times a day (TID) | ORAL | Status: AC

## 2014-10-26 NOTE — Patient Instructions (Addendum)
Please go to the basement level to have your labs drawn.  We sent prescriptions to [facility].  1. FLorastor 2. Xifaxan 550 mg 3. Robinul Forte 2 mg  Avoid lactose and artificial sweetners. Made you a follow up appointment with Dr. Gessner for 12-11-2014 at 8:30 am .   You have been scheduled for a CT scan of the abdomen and pelvis at [facility] ([address]---this is in the same building as [facility]).   You are scheduled on Tuesday 10-27-2014 at 11:00 am . You should arrive at 10:45 am prior to your appointment time for registration. Please follow the written instructions below on the day of your exam:  WARNING: IF YOU ARE ALLERGIC TO IODINE/X-RAY DYE, PLEASE NOTIFY RADIOLOGY IMMEDIATELY AT [phone]! YOU WILL BE GIVEN A 13 HOUR PREMEDICATION PREP.  1) Do not eat or drink anything after 7:00 am  (4 hours prior to your test) 2) You have been given 2 bottles of oral contrast to drink. The solution may taste better if refrigerated, but do NOT add ice or any other liquid to this solution. Shake well before drinking.    Drink 1 bottle of contrast @ 9:00 am  (2 hours prior to your exam)  Drink 1 bottle of contrast @ 10:00 am  (1 hour prior to your exam)  You may take any medications as prescribed with a small amount of water except for the following: Metformin, Glucophage, Glucovance, Avandamet, Riomet, Fortamet, Actoplus Met, Janumet, Glumetza or Metaglip. The above medications must be held the day of the exam AND 48 hours after the exam.  The purpose of you drinking the oral contrast is to aid in the visualization of your intestinal tract. The contrast solution may cause some diarrhea. Before your exam is started, you will be given a small amount of fluid to drink. Depending on your individual set of symptoms, you may also receive an intravenous injection of x-ray contrast/dye. Plan on being at  HealthCare for 30 minutes or long, depending on the  type of exam you are having performed.  If you have any questions regarding your exam or if you need to reschedule, you may call the CT department at [phone] between the hours of 8:00 am and 5:00 pm, Monday-Friday.  ________________________________________________________________________  

## 2014-10-26 NOTE — Progress Notes (Signed)
Patient ID: Nicole [name], female   DOB: 1975/05/25, 39 y.o.   MRN: [id number]   Subjective:    Patient ID: Nicole [name], female    DOB: [birth year], 39 y.o.   MRN: [id number]  HPI [name] is a pleasant 39 year old white female new to GI today referred by Dr. Kriste Basque for evaluation of frequent episodes of diarrhea abdominal cramping and nausea. Patient has prior history of hepatitis C which was treated successfully in 2012. She also has chronic anxiety/depression and sleep apnea. She had undergone colonoscopy with Dr. Madilyn Fireman in 2011 for rectal bleeding. We have obtained a copy of that report and this was a normal exam with the exception of internal and external hemorrhoids. She had remotely seen Dr. Lina Sar in 2008 with complaints of vomiting and had EGD which was also normal. Patient says she has had her current symptoms over the past 2-3 years but has had progression of the diarrhea episodes. She says she is now having 3-4 episodes per week. She's not aware of any food intolerances though has been avoiding lactose and thinks this may help a little bit. She will have normal bowel movements on the days between diarrhea episodes. She says on the days with diarrhea her stool will be loose to liquid and nonbloody. She will have multiple episodes per day. She does have complaints of upper abdominal bloating and cramping but is not sure whether this is related to the diarrhea episodes or not. She also has sporadic problems with nausea and vomiting a couple of times per week and sounds  like she has a hyperactive gag reflex. Her weight is been stable. She does not feel that she's been under any significant amount of stress recently. No new medications supplements etc. Her last antibiotic was in February. Family history is negative for GI disease.  Review of Systems Pertinent positive and negative review of systems were noted in the above HPI section.  All other review of systems was otherwise  negative.  Outpatient Encounter Prescriptions as of 10/26/2014  Medication Sig  . albuterol (PROVENTIL HFA;VENTOLIN HFA) 108 (90 BASE) MCG/ACT inhaler Inhale 2 puffs into the lungs every 6 (six) hours as needed.  . ARIPiprazole (ABILIFY) 15 MG tablet Take 15 mg by mouth daily.    Marland Kitchen buPROPion (WELLBUTRIN SR) 150 MG 12 hr tablet Take 300 mg by mouth daily.   Marland Kitchen levonorgestrel-ethinyl estradiol (NORDETTE) 0.15-30 MG-MCG tablet Take 1 tablet by mouth daily.  . Loratadine-Pseudoephedrine (ALLERGY RELIEF D PO) Take by mouth.  . Multiple Vitamin (MULTIVITAMINS PO) Take by mouth.  Marland Kitchen glycopyrrolate (ROBINUL) 2 MG tablet Take 1 tablet (2 mg total) by mouth 2 (two) times daily.  . rifaximin (XIFAXAN) 550 MG TABS tablet Take 1 tablet (550 mg total) by mouth 3 (three) times daily.  Marland Kitchen saccharomyces boulardii (FLORASTOR) 250 MG capsule Take 1 capsule (250 mg total) by mouth 2 (two) times daily.  . [DISCONTINUED] ondansetron (ZOFRAN) 4 MG tablet Take 1 tablet (4 mg total) by mouth every 8 (eight) hours as needed for nausea or vomiting. (Patient not taking: Reported on 10/26/2014)   No facility-administered encounter medications on file as of 10/26/2014.   Allergies  Allergen Reactions  . Cefaclor Rash   Patient Active Problem List   Diagnosis Date Noted  . Hepatitis C, chronic   . Depression 10/09/2013  . OSA on CPAP - sees Dr. Jenne Campus in ENT 10/09/2013  . Allergic rhinitis 05/30/2012  . Cough variant asthma 02/08/2012   History  Social History  . Marital Status: Single    Spouse Name: N/A  . Number of Children: 0  . Years of Education: N/A   Occupational History  . Risk analyst    Social History Main Topics  . Smoking status: Former Smoker -- 1.50 packs/day for 15 years    Types: Cigarettes    Quit date: 05/15/2005  . Smokeless tobacco: Never Used     Comment: quit in 2007, smoked for 13 years   . Alcohol Use: No     Comment: quit drinking on 2004---relapse in 2007---QUIT 07/2005  .  Drug Use: No     Comment: IV drug use///street drugs///inhalants  QUIT 07/2005  . Sexual Activity: Not on file   Other Topics Concern  . Not on file   Social History Narrative   Work or School: Glass blower/designer Situation: lives with husband      Spiritual Beliefs: none      Lifestyle: no regular exercise; diet is fair                Ms. Behnke's family history includes Cancer in her father; Colon polyps in her mother; Diverticulitis in her mother; Emphysema in her mother; Heart disease in her father; Hepatitis C in her father; Hypertension in her father; Osteoporosis in her maternal grandmother; Stroke in her maternal grandmother.      Objective:    Filed Vitals:   10/26/14 0829  BP: 100/60  Pulse: 96    Physical Exam  well-developed white female in no acute distress, pleasant blood pressure 100/50 pulse 96 height 5 foot weight 188. HEENT; nontraumatic normocephalic EOMI PERRLA sclera anicteric, Neck; supple no JVD, Cardiovascular; regular rate and rhythm with S1-S2 no murmur or gallop, Pulmonary; clear bilaterally, Abdomen; soft bowel sounds are present she is tender in the left mid and left lower quadrant there is no guarding or rebound no palpable mass or hepatosplenomegaly bowel sounds are present, Rectal; exam not done, Extremities; no clubbing cyanosis or edema skin warm and dry, Psych ;mood and affect appropriate       Assessment & Plan:   #64 39 year old white female with 2-3 year history of episodic diarrhea occurring 3-4 days per week. Patient also has complaints of abdominal discomfort ,cramping and intermittent nausea without vomiting. Etiology of symptoms is not clear suspect she has IBS/D. Will rule out underlying IBD #2 history of hepatitis C treated successfully 2012 #3 anxiety/depression  Plan; TTG and IgA had previously been negative, we'll check sedimentation rate CRP and stool for lactoferrin. CT scan of the abdomen and pelvis We'll give her a  course of Xifaxan 550 3 times a day 14 days treat for potential IBS D Add Robinul Forte 2 mg by mouth every morning Add daily probiotic, suggested for store one by mouth twice daily 1 month and then daily. Office follow-up in 3-4 weeks.    Antonios Ostrow Oswald Hillock PA-C 10/26/2014   Cc: Terressa Koyanagi, DO

## 2014-10-26 NOTE — Progress Notes (Signed)
Reviewed and agree.  I would like yo see her in follow up ( may be longer than 3 weeks).

## 2014-10-26 NOTE — Telephone Encounter (Signed)
I called and let the patient know that her follow up appointment with Dr. Leone Payor should have been made with Dr Juanda Chance. She had an Endo with Dr. Juanda Chance.  I changed the appointment to the same day of 12-11-2014 but to 2:15 PM to see dr. Juanda Chance.

## 2014-10-27 ENCOUNTER — Other Ambulatory Visit: Payer: Self-pay | Admitting: Family Medicine

## 2014-10-27 ENCOUNTER — Other Ambulatory Visit: Payer: 59

## 2014-10-27 ENCOUNTER — Ambulatory Visit (INDEPENDENT_AMBULATORY_CARE_PROVIDER_SITE_OTHER)
Admission: RE | Admit: 2014-10-27 | Discharge: 2014-10-27 | Disposition: A | Discharge status: Home / Self Care | Payer: 59 | Source: Ambulatory Visit | Attending: Physician Assistant | Admitting: Physician Assistant

## 2014-10-27 DIAGNOSIS — K589 Irritable bowel syndrome without diarrhea: Secondary | ICD-10-CM

## 2014-10-27 DIAGNOSIS — R110 Nausea: Secondary | ICD-10-CM | POA: Diagnosis not present

## 2014-10-27 DIAGNOSIS — R197 Diarrhea, unspecified: Secondary | ICD-10-CM

## 2014-10-27 DIAGNOSIS — R1084 Generalized abdominal pain: Secondary | ICD-10-CM | POA: Diagnosis not present

## 2014-10-27 DIAGNOSIS — R918 Other nonspecific abnormal finding of lung field: Secondary | ICD-10-CM

## 2014-10-27 DIAGNOSIS — R911 Solitary pulmonary nodule: Secondary | ICD-10-CM

## 2014-10-27 MED ORDER — IOHEXOL 300 MG/ML  SOLN
100.0000 mL | Freq: Once | INTRAMUSCULAR | Status: AC | PRN
  Administered 2014-10-27: 100 mL via INTRAVENOUS

## 2014-10-27 NOTE — Progress Notes (Signed)
Please let pt know. Notes from GI show incidental pulm nodules with recs to repeat CT chest in 1 year. This order was placed.

## 2014-10-28 LAB — GASTROINTESTINAL PATHOGEN PANEL PCR
C. difficile Tox A/B, PCR: NEGATIVE
CRYPTOSPORIDIUM, PCR: NEGATIVE
Campylobacter, PCR: NEGATIVE
E COLI (ETEC) LT/ST, PCR: NEGATIVE
E COLI (STEC) STX1/STX2, PCR: NEGATIVE
E coli 0157, PCR: NEGATIVE
Giardia lamblia, PCR: NEGATIVE
Norovirus, PCR: NEGATIVE
Rotavirus A, PCR: NEGATIVE
SALMONELLA, PCR: NEGATIVE
Shigella, PCR: NEGATIVE

## 2014-10-28 NOTE — Progress Notes (Signed)
Patient informed. 

## 2014-10-29 LAB — FECAL LACTOFERRIN, QUANT: Lactoferrin: NEGATIVE

## 2014-11-05 ENCOUNTER — Telehealth: Payer: Self-pay | Admitting: Physician Assistant

## 2014-11-05 NOTE — Telephone Encounter (Signed)
Advised the patient these are possible side effects. She is taking the Robinul only at bedtime because of the drowsiness. Her diarrhea and abdominal symptoms have stopped. She will hold the Robinul 1 day and see if her symptoms improve. There is no rash, hives or difficulty breathing.

## 2014-11-09 ENCOUNTER — Telehealth: Payer: Self-pay | Admitting: *Deleted

## 2014-11-09 NOTE — Telephone Encounter (Signed)
Certainly those are common side effects with Robinul and other anticholinergics. She can take med just prn if sxs return

## 2014-11-09 NOTE — Telephone Encounter (Signed)
Amy from [facility] called and this patient got her Xifaxan 550 mg on 11-03-2014.

## 2014-12-11 ENCOUNTER — Encounter: Payer: Self-pay | Admitting: Internal Medicine

## 2014-12-11 ENCOUNTER — Ambulatory Visit: Payer: 59 | Admitting: Internal Medicine

## 2014-12-11 ENCOUNTER — Ambulatory Visit (INDEPENDENT_AMBULATORY_CARE_PROVIDER_SITE_OTHER): Payer: 59 | Admitting: Internal Medicine

## 2014-12-11 VITALS — BP 104/62 | HR 88 | Ht 60.25 in

## 2014-12-11 DIAGNOSIS — K589 Irritable bowel syndrome without diarrhea: Secondary | ICD-10-CM

## 2014-12-11 DIAGNOSIS — R197 Diarrhea, unspecified: Secondary | ICD-10-CM | POA: Diagnosis not present

## 2014-12-11 NOTE — Progress Notes (Signed)
Nicole [name] 10-Apr-1976 [id number]  Note: This dictation was prepared with Dragon digital system. Any transcriptional errors that result from this procedure are unintentional.   History of Present Illness: This is a 39 year old white female with history of hepatitis C treated in 2012. No detectable viral load  by HepC RNA recent exam. She was seen on 10/26/2014 by Mike Gip PA for symptoms suggestive of irritable bowel syndrome. Colonoscopy in 2011 for rectal bleeding showed normal colon with internal and external hemorrhoids. She has been treated with course of Xifaxan 550 mg 3 times a day. Robinul Forte 2 mg a day and probiotics. She was unable to tolerate Robinul for more than 10 days but her symptoms have improved markedly to the point where she is not having any  diarrhea or abdominal pain. Her stool for pathogens and lactoferrin was negative. Sedimentation rate was 6 and tissue transglutaminase was negative. Gallbladder  ultrasound several years ago was negative. There is a positive family history of gallbladder disease in her mother and maternal grandmother    Past Medical History  Diagnosis Date  . Hepatitis C, chronic     hep c tx successful  . Asthma   . Depression (emotion)   . Abnormal pap     w LEEP  . Seasonal allergies     scratch test in 2006///mold issue in house  . Blood in stool   . Frequent headaches   . OSA on CPAP - sees Dr. Jenne Campus in ENT 10/09/2013  . Alcoholism     Past Surgical History  Procedure Laterality Date  . Leep  2008  . Dilation and curettage of uterus  1996, 2001, 2002    x 3  . Nasal fracture surgery  1999    repair  . Nasal septoplasty w/ turbinoplasty      Allergies  Allergen Reactions  . Cefaclor Rash    Family history and social history have been reviewed.  Review of Systems: Denies diarrhea constipation abdominal pain  The remainder of the 10 point ROS is negative except as outlined in the H&P  Physical Exam: General  Appearance Well developed, in no distress, mildly overweight Eyes  Non icteric  HEENT  Non traumatic, normocephalic  Mouth No lesion, tongue papillated, no cheilosis Neck Supple without adenopathy, thyroid not enlarged, no carotid bruits, no JVD Lungs Clear to auscultation bilaterally COR Normal S1, normal S2, regular rhythm, no murmur, quiet precordium Abdomen protuberant soft, nontender. Liver edge at costal margin. Lower abdomen normal Rectal not done Extremities  No pedal edema Skin No lesions Neurological Alert and oriented x 3 Psychological Normal mood and affect  Assessment and Plan:   39 year old white female with irritable bowel syndrome with bouts of diarrhea now under good control after course of Xifaxan and anti-spasmodic. She will continue probiotics and high-fiber diet. She will see [place] on when necessary basis   Hepatitis C, history of:Marland Kitchen No viral load detected. She is a high risk for steatohepatitis. We have discussed weight loss through exercise      Lina Sar 12/11/2014

## 2014-12-11 NOTE — Patient Instructions (Addendum)
Continue probiotic and stay on a high fiber diet.   You can take robinul as needed.   Dr Selena Batten

## 2014-12-17 ENCOUNTER — Ambulatory Visit (INDEPENDENT_AMBULATORY_CARE_PROVIDER_SITE_OTHER): Payer: 59 | Admitting: Family Medicine

## 2014-12-17 ENCOUNTER — Encounter: Payer: Self-pay | Admitting: Family Medicine

## 2014-12-17 VITALS — BP 100/60 | HR 75 | Temp 98.0°F | Wt 186.0 lb

## 2014-12-17 DIAGNOSIS — R110 Nausea: Secondary | ICD-10-CM

## 2014-12-17 DIAGNOSIS — G43809 Other migraine, not intractable, without status migrainosus: Secondary | ICD-10-CM

## 2014-12-17 MED ORDER — SUMATRIPTAN SUCCINATE 100 MG PO TABS: ORAL_TABLET | ORAL | Status: DC

## 2014-12-17 MED ORDER — ONDANSETRON 8 MG PO TBDP: 8.0000 mg | ORAL_TABLET | Freq: Three times a day (TID) | ORAL | Status: DC | PRN

## 2014-12-17 MED ORDER — KETOROLAC TROMETHAMINE 60 MG/2ML IM SOLN
60.0000 mg | Freq: Once | INTRAMUSCULAR | Status: AC
  Administered 2014-12-17: 60 mg via INTRAMUSCULAR

## 2014-12-17 NOTE — Patient Instructions (Signed)

## 2014-12-17 NOTE — Progress Notes (Signed)
Pre visit review using our clinic review tool, if applicable. No additional management support is needed unless otherwise documented below in the visit note. 

## 2014-12-17 NOTE — Progress Notes (Signed)
Subjective:    Patient ID: Nicole [name], female    DOB: [birth year], 39 y.o.   MRN: [id number]  HPI Acute visit. Patient seen with headache which started this morning. She states she has history of migraines and this is somewhat similar. She has infrequent migraines about every 2 years. Current headache is somewhat bilateral occipital and throbbing quality. She's had some photosensitivity. She has had some mild nausea which is actually some better compared to several hours ago. She took 800 mg ibuprofen this morning without much relief and subsequently took some Tylenol without relief. Current headache is 6 out of 10 severity. Denies any confusion. No upper or lower extremity weakness. She is not aware of triggers. She does have some poor sleep quality generally  She does take oral contraception and does not have any history of preceding our out with her migraines  Past Medical History  Diagnosis Date  . Hepatitis C, chronic     hep c tx successful  . Asthma   . Depression (emotion)   . Abnormal pap     w LEEP  . Seasonal allergies     scratch test in 2006///mold issue in house  . Blood in stool   . Frequent headaches   . OSA on CPAP - sees Dr. Jenne Campus in ENT 10/09/2013  . Alcoholism    Past Surgical History  Procedure Laterality Date  . Leep  2008  . Dilation and curettage of uterus  1996, 2001, 2002    x 3  . Nasal fracture surgery  1999    repair  . Nasal septoplasty w/ turbinoplasty      reports that she quit smoking about 9 years ago. Her smoking use included Cigarettes. She has a 22.5 pack-year smoking history. She has never used smokeless tobacco. She reports that she does not drink alcohol or use illicit drugs. family history includes Cancer in her father; Colon polyps in her mother; Diverticulitis in her mother; Emphysema in her mother; Heart disease in her father; Hepatitis C in her father; Hypertension in her father; Osteoporosis in her maternal grandmother; Stroke in her  maternal grandmother. Allergies  Allergen Reactions  . Cefaclor Rash      Review of Systems  Constitutional: Negative for fever and chills.  Eyes: Positive for photophobia. Negative for visual disturbance.  Respiratory: Negative for shortness of breath.   Cardiovascular: Negative for chest pain.  Gastrointestinal: Positive for nausea. Negative for vomiting and abdominal pain.  Neurological: Positive for headaches. Negative for dizziness.  Psychiatric/Behavioral: Negative for confusion.       Objective:   Physical Exam  Constitutional: She is oriented to person, place, and time. She appears well-developed and well-nourished.  HENT:  Head: Normocephalic and atraumatic.  Mouth/Throat: Oropharynx is clear and moist.  Eyes: Pupils are equal, round, and reactive to light.  Fundi benign  Neck: Neck supple.  Neck is supple. She does have some muscular tenderness at the attachment of muscles to occiput  Cardiovascular: Normal rate and regular rhythm.   Pulmonary/Chest: Effort normal and breath sounds normal. No respiratory distress. She has no wheezes. She has no rales.  Neurological: She is alert and oriented to person, place, and time. No cranial nerve deficit.  No focal weakness. Gait normal. No ataxia.  Psychiatric: She has a normal mood and affect. Her behavior is normal.          Assessment & Plan:  Headache. Suspect migraine without aura. Toradol 60 mg IM given. Patient noted  reduction in pain from 6 out of 10 to-2 or 3 and improving. Prescription for Imitrex 100 mg at onset of migraine and may repeat once in 2 hours as needed.

## 2014-12-23 ENCOUNTER — Encounter: Payer: Self-pay | Admitting: Family Medicine

## 2014-12-23 ENCOUNTER — Ambulatory Visit (INDEPENDENT_AMBULATORY_CARE_PROVIDER_SITE_OTHER): Payer: 59 | Admitting: Family Medicine

## 2014-12-23 VITALS — BP 116/80 | HR 87 | Temp 98.5°F | Ht 60.25 in | Wt 187.6 lb

## 2014-12-23 DIAGNOSIS — R21 Rash and other nonspecific skin eruption: Secondary | ICD-10-CM

## 2014-12-23 MED ORDER — TRIAMCINOLONE ACETONIDE 0.1 % EX CREA: 1.0000 "application " | TOPICAL_CREAM | Freq: Two times a day (BID) | CUTANEOUS | Status: DC

## 2014-12-23 NOTE — Patient Instructions (Signed)
Keep hands out of soil, water and away from metal exposure  Use a good moisturizer such as cerave  Use the triamcinilone twice daily  Follow up with your dermatologist for further evaluation -We placed a referral for you as discussed. It usually takes about 1-2 weeks to process and schedule this referral. If you have not heard from [place] regarding this appointment in 2 weeks please contact our office.

## 2014-12-23 NOTE — Progress Notes (Signed)
Pre visit review using our clinic review tool, if applicable. No additional management support is needed unless otherwise documented below in the visit note. 

## 2014-12-23 NOTE — Progress Notes (Addendum)
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  Past Medical History  Diagnosis Date  . Hepatitis C, chronic     hep c tx successful  . Asthma   . Depression (emotion)   . Abnormal pap     w LEEP  . Seasonal allergies     scratch test in 2006///mold issue in house  . Blood in stool   . Frequent headaches   . OSA on CPAP - sees Dr. Jenne Campus in ENT 10/09/2013  . Alcoholism     Past Surgical History  Procedure Laterality Date  . Leep  2008  . Dilation and curettage of uterus  1996, 2001, 2002    x 3  . Nasal fracture surgery  1999    repair  . Nasal septoplasty w/ turbinoplasty      Family History  Problem Relation Age of Onset  . Emphysema Mother   . Heart disease Father   . Cancer Father     unsure??  . Stroke Maternal Grandmother   . Hypertension Father   . Osteoporosis Maternal Grandmother   . Hepatitis C Father   . Colon polyps Mother   . Diverticulitis Mother     Social History   Social History  . Marital Status: Single    Spouse Name: N/A  . Number of Children: 0  . Years of Education: N/A   Occupational History  . Risk analyst    Social History Main Topics  . Smoking status: Former Smoker -- 1.50 packs/day for 15 years    Types: Cigarettes    Quit date: 05/15/2005  . Smokeless tobacco: Never Used     Comment: quit in 2007, smoked for 13 years   . Alcohol Use: No     Comment: quit drinking on 2004---relapse in 2007---QUIT 07/2005  . Drug Use: No     Comment: IV drug use///street drugs///inhalants  QUIT 07/2005  . Sexual Activity: Not Asked   Other Topics Concern  . None   Social History Narrative   Work or School: Public relations account executive Situation: lives with husband      Spiritual Beliefs: none      Lifestyle: no regular exercise; diet is fair                 Current outpatient prescriptions:  .  albuterol (PROVENTIL HFA;VENTOLIN HFA) 108 (90 BASE) MCG/ACT inhaler, Inhale 2 puffs into the lungs every 6 (six) hours as needed., Disp: , Rfl:  .  ARIPiprazole (ABILIFY) 15 MG tablet, Take 15 mg by mouth daily.  , Disp: , Rfl:  .  buPROPion (WELLBUTRIN SR) 150 MG 12 hr tablet, Take 300 mg by mouth daily. , Disp: , Rfl:  .  cetirizine (ZYRTEC) 10 MG tablet, Take 10 mg by mouth daily., Disp: , Rfl:  .  levonorgestrel-ethinyl estradiol (NORDETTE) 0.15-30 MG-MCG tablet, Take 1 tablet by mouth daily., Disp: , Rfl:  .  Multiple Vitamin (MULTIVITAMINS PO), Take by mouth., Disp: , Rfl:  .  ondansetron (ZOFRAN ODT) 8 MG disintegrating tablet, Take 1 tablet (8 mg total) by mouth every 8 (eight) hours  as needed for nausea or vomiting., Disp: 15 tablet, Rfl: 0 .  saccharomyces boulardii (FLORASTOR) 250 MG capsule, Take 1 capsule (250 mg total) by mouth 2 (two) times daily., Disp: 60 capsule, Rfl: 0 .  SUMAtriptan (IMITREX) 100 MG tablet, May repeat in 2 hours if headache persists or recurs. No more than 2 in 24 hours., Disp: 10 tablet, Rfl: 1 .  triamcinolone cream (KENALOG) 0.1 %, Apply 1 application topically 2 (two) times daily., Disp: 30 g, Rfl: 0  EXAM:  Filed Vitals:   12/23/14 1047  BP: 116/80  Pulse: 87  Temp: 98.5 F (36.9 C)    Body mass index is 36.35 kg/(m^2).  GENERAL: vitals reviewed and listed above, alert, oriented, appears well hydrated and in no acute distress  HEENT: atraumatic, conjunttiva clear, no obvious abnormalities on inspection of external nose and ears  NECK: no obvious masses on inspection  SKIN: fine flesh colored and brownish small papules in a patchy distribution on the R index finger, some thickened scaly skin here and in webs elsewhere; no other rash on extremities,  feet, toes ect.  MS: moves all extremities without noticeable abnormality  PSYCH: pleasant and cooperative, no obvious depression or anxiety  ASSESSMENT AND PLAN:  Discussed the following assessment and plan:  Rash and nonspecific skin eruption - Plan: triamcinolone cream (KENALOG) 0.1 %, Ambulatory referral to Dermatology  -we discussed possible serious and likely etiologies, workup and treatment, treatment risks and return precautions -she wants a definitive dx, some of the finding look very much like hand excema, some of the findings on the R index finger look more vascular, query raynauds -she opted to see dermatologist and 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  There are no Patient Instructions on file for this visit.   Nicole Basque R.

## 2015-02-09 ENCOUNTER — Other Ambulatory Visit: Payer: Self-pay | Admitting: Family Medicine

## 2015-03-06 ENCOUNTER — Encounter: Payer: Self-pay | Admitting: Family Medicine

## 2015-04-29 ENCOUNTER — Encounter: Payer: 59 | Admitting: Family Medicine

## 2015-05-12 ENCOUNTER — Ambulatory Visit (INDEPENDENT_AMBULATORY_CARE_PROVIDER_SITE_OTHER): Payer: 59 | Admitting: Internal Medicine

## 2015-05-12 ENCOUNTER — Encounter: Payer: Self-pay | Admitting: Internal Medicine

## 2015-05-12 VITALS — BP 128/78 | HR 105 | Temp 98.0°F | Resp 20 | Ht 61.0 in | Wt 183.2 lb

## 2015-05-12 DIAGNOSIS — J069 Acute upper respiratory infection, unspecified: Secondary | ICD-10-CM

## 2015-05-12 DIAGNOSIS — B9789 Other viral agents as the cause of diseases classified elsewhere: Principal | ICD-10-CM

## 2015-05-12 NOTE — Patient Instructions (Signed)
Zicam Melts or Zinc lozenges as per package label for sore throat .  Complementary options to boost immunity include  vitamin C 2000 mg daily; & Echinacea for 4-7 days.     Plain Mucinex (NOT D) for thick secretions ;force NON dairy fluids .   Nasal cleansing in the shower as discussed with lather of mild shampoo.After 10 seconds wash off lather while  exhaling through nostrils. Make sure that all residual soap is removed to prevent irritation.  Flonase OR Nasacort AQ 1 spray in each nostril twice a day as needed. Use the "crossover" technique into opposite nostril spraying toward opposite ear @ 45 degree angle, not straight up into nostril.  Plain Allegra (NOT D )  160 daily , Loratidine 10 mg , OR Zyrtec 10 mg @ bedtime  as needed for itchy eyes & sneezing. 

## 2015-05-12 NOTE — Progress Notes (Signed)
Pre visit review using our clinic review tool, if applicable. No additional management support is needed unless otherwise documented below in the visit note. 

## 2015-05-12 NOTE — Progress Notes (Signed)
   Subjective:    Patient ID: Nicole [name], female    DOB: 1975/05/16, 39 y.o.   MRN: [id number]  HPI 2 days ago symptoms began as sore throat and arthralgias/myalgias. She's been using Chloraseptic spray and Mucinex over-the-counter product.  She's had associated watery eyes, sneezing, and rhinitis with clear secretions. She describes a nonproductive cough and some shortness of breath. She used 2 sprays of albuterol once.  She denies other upper or lower respiratory tract infection symptoms    Review of Systems Frontal headache, facial pain , nasal purulence, dental pain,  otic pain or otic discharge denied. No fever , chills or sweats. No wheezing with the cough.      Objective:   Physical Exam General appearance:Adequately nourished; no acute distress or increased work of breathing is present.    Lymphatic: No  lymphadenopathy about the head, neck, or axilla .  Eyes: No conjunctival inflammation or lid edema is present. There is no scleral icterus.  Ears:  External ear exam shows no significant lesions or deformities.  Otoscopic examination reveals clear canals, tympanic membranes are intact bilaterally without bulging, retraction, inflammation or discharge.  Nose:  External nasal examination shows no deformity or inflammation. Nasal mucosa are markedly erythematous without lesions or exudates No septal dislocation or deviation.No obstruction to airflow.   Oral exam: Dental hygiene is good; lips and gums are healthy appearing.There is no oropharyngeal erythema or exudate .  Neck:  No deformities, thyromegaly, masses, or tenderness noted.   Supple with full range of motion without pain.   Heart:  Normal rate and regular rhythm. S1 and S2 normal without gallop, murmur, click, rub or other extra sounds.   Lungs:Chest clear to auscultation; no wheezes, rhonchi,rales ,or rubs present.  Extremities:  No cyanosis, edema, or clubbing  noted    Skin: Warm & dry w/o tenting or  jaundice. No significant lesions or rash.      Assessment & Plan:  #1 viral URI #2 pharyngitis See orders & AVS

## 2015-05-14 ENCOUNTER — Telehealth: Payer: Self-pay | Admitting: Family Medicine

## 2015-05-14 ENCOUNTER — Ambulatory Visit (INDEPENDENT_AMBULATORY_CARE_PROVIDER_SITE_OTHER): Payer: 59 | Admitting: Family Medicine

## 2015-05-14 VITALS — BP 126/80 | HR 109 | Temp 99.0°F | Resp 17 | Ht 60.5 in | Wt 192.0 lb

## 2015-05-14 DIAGNOSIS — J069 Acute upper respiratory infection, unspecified: Secondary | ICD-10-CM | POA: Diagnosis not present

## 2015-05-14 DIAGNOSIS — R112 Nausea with vomiting, unspecified: Secondary | ICD-10-CM

## 2015-05-14 DIAGNOSIS — J329 Chronic sinusitis, unspecified: Secondary | ICD-10-CM

## 2015-05-14 DIAGNOSIS — R05 Cough: Secondary | ICD-10-CM

## 2015-05-14 DIAGNOSIS — R059 Cough, unspecified: Secondary | ICD-10-CM

## 2015-05-14 MED ORDER — HYDROCODONE-HOMATROPINE 5-1.5 MG/5ML PO SYRP: 5.0000 mL | ORAL_SOLUTION | ORAL | Status: DC | PRN

## 2015-05-14 MED ORDER — AMOXICILLIN 875 MG PO TABS
875.0000 mg | ORAL_TABLET | Freq: Two times a day (BID) | ORAL | Status: DC
Start: 2015-05-14 — End: 2015-12-01

## 2015-05-14 MED ORDER — BENZONATATE 100 MG PO CAPS: 100.0000 mg | ORAL_CAPSULE | Freq: Three times a day (TID) | ORAL | Status: DC | PRN

## 2015-05-14 NOTE — Telephone Encounter (Signed)
Noted  

## 2015-05-14 NOTE — Telephone Encounter (Signed)
Please follow-up with patient if not checked in within hour

## 2015-05-14 NOTE — Telephone Encounter (Signed)
Pt is currently sitting in [facility] waiting area.

## 2015-05-14 NOTE — Telephone Encounter (Signed)
Patient Name: Lawson FiscalLORI Mckenney DOB: [birth year] Initial Comment Caller states her congestion is worse after dr. told her it was viral infection - mucus is yellow/brown with blood now. Nurse Assessment Nurse: Charna Elizabethrumbull, RN, Cathy Date/Time (Eastern Time): 05/14/2015 9:32:55 AM Confirm and document reason for call. If symptomatic, describe symptoms. ---Caller states she developed sinus congestion and pain 3 days ago. No severe breathing difficulty. No fever. Has the patient traveled out of the country within the last 30 days? ---No Does the patient have any new or worsening symptoms? ---Yes Will a triage be completed? ---Yes Related visit to physician within the last 2 weeks? ---Yes Does the PT have any chronic conditions? (i.e. diabetes, asthma, etc.) ---Yes List chronic conditions. ---Sleep Apnea, Hepatitis C in the past, Depression, Asthma (no wheezing) Did the patient indicate they were pregnant? ---No Is this a behavioral health or substance abuse call? ---No Guidelines Guideline Title Affirmed Question Affirmed Notes Sinus Pain or Congestion [1] Using nasal washes and pain medicine > 24 hours AND [2] sinus pain (around cheekbone or eye) persists Final Disposition User See PCP When Office is Open (within 3 days) Charna Elizabethrumbull, RN, [facility]MC[facility]Cathy Referrals She plans to go to [facility] today.  Disagree/Comply: Comply

## 2015-05-14 NOTE — Patient Instructions (Signed)
Drink plenty of fluids and get enough rest  Take Tylenol or ibuprofen if needed for pain or fever  Continue using the Zyrtec.  Continue using the fluticasone nose spray  Continue using the pseudoephedrine if needed  Take the amoxicillin 875 mg one twice daily at breakfast and supper  Take the Hycodan cough syrup 1 teaspoon every 4-6 hours as needed for cough. This will tend to cause some sedation.  Take the benzonatate cough pills one or 2 pills 3 times daily as needed for daytime cough, this should be fairly nonsedating  If you're getting worse rather than better please return

## 2015-05-14 NOTE — Progress Notes (Signed)
Patient ID: Nicole [name], female    DOB: [birth year]  Age: 39 y.o. MRN: [id number]  Chief Complaint  Patient presents with  . Sinusitis  . URI    Subjective:   39 year old lady who comes in here with history of having an upper respiratory infection since first part of the week. She has had head congestion and sore throat. She has gradually developed more coughing and pressure in her head. She went to see Dr. Marga MelnickWilliam Jaleiah [name] and he treated her for a upper respiratory virus with some nose spray she's been taking OTCs Zyrtec. She has continued to get more congested and has now started blowing very PERRLA dense and was bloody mucus. She coughs so hard that she vomited. She does not smoke. She did get a flu shot this year. Works from home.    Current allergies, medications, problem list, past/family and social histories reviewed.  Objective:  BP 126/80 mmHg  Pulse 109  Temp(Src) 99 F (37.2 C) (Oral)  Resp 17  Ht 5' 0.5" (1.537 m)  Wt 192 lb (87.091 kg)  BMI 36.87 kg/m2  SpO2 96%   Somewhat ill-appearing lady, sniffling and coughing some. She is overweight. She has normal tympanic membranes. Throat has some erythema down the middle of it. Tender around her sinuses, especially maxillary area. Chest is clear to auscultation. Heart regular without murmur. No major nodes in her neck.    Assessment & Plan:   Assessment: 1. Rhinosinusitis   2. Acute upper respiratory infection   3. Nausea and vomiting, vomiting of unspecified type   4. Cough       Plan: Will treat with an antibiotic since this seems to be progressing to a worse state rather than improving.  No orders of the defined types were placed in this encounter.    Meds ordered this encounter  Medications  . amoxicillin (AMOXIL) 875 MG tablet    Sig: Take 1 tablet (875 mg total) by mouth 2 (two) times daily.    Dispense:  20 tablet    Refill:  0  . HYDROcodone-homatropine (HYCODAN) 5-1.5 MG/5ML syrup    Sig: Take 5 mLs  by mouth every 4 (four) hours as needed.    Dispense:  120 mL    Refill:  0  . benzonatate (TESSALON) 100 MG capsule    Sig: Take 1-2 capsules (100-200 mg total) by mouth 3 (three) times daily as needed.    Dispense:  30 capsule    Refill:  0         Patient Instructions  Drink plenty of fluids and get enough rest  Take Tylenol or ibuprofen if needed for pain or fever  Continue using the Zyrtec.  Continue using the fluticasone nose spray  Continue using the pseudoephedrine if needed  Take the amoxicillin 875 mg one twice daily at breakfast and supper  Take the Hycodan cough syrup 1 teaspoon every 4-6 hours as needed for cough. This will tend to cause some sedation.  Take the benzonatate cough pills one or 2 pills 3 times daily as needed for daytime cough, this should be fairly nonsedating  If you're getting worse rather than better please return     Return if symptoms worsen or fail to improve.   Nicole Pettaway, MD 05/14/2015

## 2015-09-17 ENCOUNTER — Ambulatory Visit: Payer: 59 | Admitting: Gastroenterology

## 2015-11-13 ENCOUNTER — Other Ambulatory Visit: Payer: Self-pay | Admitting: Family Medicine

## 2015-12-01 ENCOUNTER — Encounter: Payer: Self-pay | Admitting: Gastroenterology

## 2015-12-01 ENCOUNTER — Telehealth: Payer: Self-pay | Admitting: Gastroenterology

## 2015-12-01 ENCOUNTER — Ambulatory Visit (INDEPENDENT_AMBULATORY_CARE_PROVIDER_SITE_OTHER): Payer: PRIVATE HEALTH INSURANCE | Admitting: Gastroenterology

## 2015-12-01 ENCOUNTER — Other Ambulatory Visit (INDEPENDENT_AMBULATORY_CARE_PROVIDER_SITE_OTHER): Payer: PRIVATE HEALTH INSURANCE

## 2015-12-01 VITALS — BP 110/70 | HR 93 | Ht 60.0 in | Wt 197.0 lb

## 2015-12-01 DIAGNOSIS — K589 Irritable bowel syndrome without diarrhea: Secondary | ICD-10-CM

## 2015-12-01 DIAGNOSIS — Z8619 Personal history of other infectious and parasitic diseases: Secondary | ICD-10-CM

## 2015-12-01 DIAGNOSIS — R1013 Epigastric pain: Secondary | ICD-10-CM

## 2015-12-01 LAB — HEPATIC FUNCTION PANEL
ALBUMIN: 4 g/dL (ref 3.5–5.2)
ALT: 25 U/L (ref 0–35)
AST: 18 U/L (ref 0–37)
Alkaline Phosphatase: 34 U/L — ABNORMAL LOW (ref 39–117)
BILIRUBIN TOTAL: 0.4 mg/dL (ref 0.2–1.2)
Bilirubin, Direct: 0.1 mg/dL (ref 0.0–0.3)
TOTAL PROTEIN: 7.1 g/dL (ref 6.0–8.3)

## 2015-12-01 MED ORDER — DICYCLOMINE HCL 10 MG PO CAPS: 10.0000 mg | ORAL_CAPSULE | Freq: Three times a day (TID) | ORAL | Status: DC | PRN

## 2015-12-01 MED ORDER — ONDANSETRON 8 MG PO TBDP: 8.0000 mg | ORAL_TABLET | Freq: Three times a day (TID) | ORAL | Status: DC | PRN

## 2015-12-01 MED ORDER — RIFAXIMIN 550 MG PO TABS: 550.0000 mg | ORAL_TABLET | Freq: Three times a day (TID) | ORAL | Status: DC

## 2015-12-01 NOTE — Telephone Encounter (Signed)
Faxed form to [facility] to hopefully get the xifaxan [place].

## 2015-12-01 NOTE — Progress Notes (Signed)
HPI :  40 y/o female, former patient of Dr. Juanda Chance and new to me, here for a follow up visit. She has a history of IBS. History of hepatitis C treated in 2012  She has had a prior evaluation for her symptoms with inflammatory markers, celiac serology, and stool studies which has been unremarkable, Colonoscopy in 2011 for rectal bleeding showed normal colon with internal and external hemorrhoids. EGD 02/2007 was normal. H pylori testing has been negative.   She reports some ongoing symptoms which can bother her fairly routinely. She reports around 4 BMs per day, loose in general. She rarely has solid stools. She has increased urgency to use bathroom. She endorses increased gas and bloating which can bother her. She has reliable relief of symptoms with a bowel movement. She is taking immodium, robinul, and zofran PRN. She is taking immodium most days of the week, she thinks it helps but makes her tired.  She has had benefit following treatment with Rifaximin in the past. She has very rare blood in the stools which showed hemorrhoids for which she has had an evaluation for this in the past. She has intermittent nausea with occasional postprandial discomfort.   She reports ongoing fatigue, she has a history of OSA and uses CPAP machine which she thinks helps. She has not had a evaluation for sleep since 2014.    Past Medical History  Diagnosis Date  . Hepatitis C, chronic (HCC)     hep c tx successful  . Asthma   . Depression (emotion)   . Abnormal pap     w LEEP  . Seasonal allergies     scratch test in 2006///mold issue in house  . Blood in stool   . Frequent headaches   . OSA on CPAP - sees Dr. Jenne [name] in ENT 10/09/2013  . Alcoholism (HCC)   . Allergy   . Irritable bowel syndrome      Past Surgical History  Procedure Laterality Date  . Leep  2008  . Dilation and curettage of uterus  1996, 2001, 2002    x 3  . Nasal fracture surgery  1999    repair  . Nasal septoplasty w/  turbinoplasty     Family History  Problem Relation Age of Onset  . Emphysema Mother   . Heart disease Father   . Cancer Father     unsure??  . Stroke Maternal Grandmother   . Hypertension Father   . Osteoporosis Maternal Grandmother   . Hepatitis C Father   . Colon polyps Mother   . Diverticulitis Mother    Social History  Substance Use Topics  . Smoking status: Former Smoker -- 1.50 packs/day for 15 years    Types: Cigarettes    Quit date: 05/15/2005  . Smokeless tobacco: Never Used     Comment: quit in 2007, smoked for 13 years   . Alcohol Use: No     Comment: quit drinking on 2004---relapse in 2007---QUIT 07/2005   Current Outpatient Prescriptions  Medication Sig Dispense Refill  . albuterol (PROVENTIL HFA;VENTOLIN HFA) 108 (90 BASE) MCG/ACT inhaler Inhale 2 puffs into the lungs every 6 (six) hours as needed.    . ARIPiprazole (ABILIFY) 15 MG tablet Take 15 mg by mouth daily.      Marland Kitchen buPROPion (WELLBUTRIN SR) 150 MG 12 hr tablet Take 300 mg by mouth daily.     . cetirizine (ZYRTEC) 10 MG tablet Take 10 mg by mouth daily.    Marland Kitchen  LEVORA 0.15/30, 28, 0.15-30 MG-MCG tablet TAKE 1 TABLET BY MOUTH EVERY DAY 28 tablet 0  . Multiple Vitamin (MULTIVITAMINS PO) Take by mouth.    . ondansetron (ZOFRAN ODT) 8 MG disintegrating tablet Take 1 tablet (8 mg total) by mouth every 8 (eight) hours as needed for nausea or vomiting. 15 tablet 0  . SUMAtriptan (IMITREX) 100 MG tablet May repeat in 2 hours if headache persists or recurs. No more than 2 in 24 hours. 10 tablet 1  . triamcinolone cream (KENALOG) 0.1 % Apply 1 application topically 2 (two) times daily. 30 g 0  . dicyclomine (BENTYL) 10 MG capsule Take 1 capsule (10 mg total) by mouth every 8 (eight) hours as needed. 60 capsule 3  . rifaximin (XIFAXAN) 550 MG TABS tablet Take 1 tablet (550 mg total) by mouth 3 (three) times daily. 42 tablet 0   No current facility-administered medications for this visit.   Allergies  Allergen Reactions    . Cefaclor Rash  . Nickel Rash     Review of Systems: All systems reviewed and negative except where noted in HPI.   Lab Results  Component Value Date   WBC 5.9 10/26/2014   HGB 15.5* 10/26/2014   HCT 45.5 10/26/2014   MCV 91.4 10/26/2014   PLT 292.0 10/26/2014   Lab Results  Component Value Date   CREATININE 1.05 10/26/2014   BUN 9 10/26/2014   NA 138 10/26/2014   K 3.9 10/26/2014   CL 106 10/26/2014   CO2 27 10/26/2014    Lab Results  Component Value Date   ALT 25 12/01/2015   AST 18 12/01/2015   ALKPHOS 34* 12/01/2015   BILITOT 0.4 12/01/2015      Physical Exam: BP 110/70 mmHg  Pulse 93  Ht 5' (1.524 m)  Wt 197 lb (89.359 kg)  BMI 38.47 kg/m2  LMP 11/17/2015 (Exact Date) Constitutional: Pleasant,well-developed, female in no acute distress. HEENT: Normocephalic and atraumatic. Conjunctivae are normal. No scleral icterus. Neck supple.  Cardiovascular: Normal rate, regular rhythm.  Pulmonary/chest: Effort normal and breath sounds normal. No wheezing, rales or rhonchi. Abdominal: Soft, nondistended, nontender. Bowel sounds active throughout. There are no masses palpable. No hepatomegaly. Extremities: no edema Lymphadenopathy: No cervical adenopathy noted. Neurological: Alert and oriented to person place and time. Skin: Skin is warm and dry. No rashes noted. Psychiatric: Normal mood and affect. Behavior is normal.   ASSESSMENT AND PLAN: 40 y/o female here for reassessment of the following issues:  IBS - counseled on low FODMOP diet and she will try this. She can stop Robinul. We will also try her on a 2 week course of Rifaximin, and she can use bentyl PRN and immodium PRN. Follow up as needed for this issue.   Dyspepsia / nausea - prior EGD unremarkable for these symptoms. Will use zofran PRN which has helped in the past and will try her on FD gard to use PRN and see if this helps. Follow up as needed for this issue.   History of hep C - previously  treated, no recent LFTs, will check to ensure normal. She agreed.   Nicole PatrickSteven Armbruster, MD [facility] Pager [phone]

## 2015-12-01 NOTE — Patient Instructions (Addendum)
Today we are giving you a FODMAP to read and follow.   We have sent the following medications to your pharmacy for you to pick up at your convenience: Zofran, bentyl   Take over the counter Imodium as needed.  Your physician has requested that you go to the basement for the following lab work before leaving today: Hepatic panel   Stop your Robinul per Dr Adela LankArmbruster.   Today we are giving you  Samples of FDgard to try, take as directed on package.  Coupon provided.   We have sent your demographic information and a prescription for Xifaxan to [facility]. This pharmacy is able to get medication approved through insurance and get you the lowest copay possible. If you have not heard from them within 1 week, please call our office at [phone] to let us know.    I appreciate the opportunity to care for you.

## 2015-12-03 NOTE — Telephone Encounter (Signed)
Xifaxan approved

## 2015-12-11 ENCOUNTER — Other Ambulatory Visit: Payer: Self-pay | Admitting: Family Medicine

## 2015-12-15 ENCOUNTER — Telehealth: Payer: Self-pay | Admitting: Family Medicine

## 2015-12-15 MED ORDER — LEVONORGESTREL-ETHINYL ESTRAD 0.15-30 MG-MCG PO TABS: 1.0000 | ORAL_TABLET | Freq: Every day | ORAL | 11 refills | Status: DC

## 2015-12-15 NOTE — Telephone Encounter (Signed)
Pt needs a refill on levora [address] [facility] [address]

## 2015-12-15 NOTE — Telephone Encounter (Signed)
Autumn please close this encounter my me

## 2015-12-15 NOTE — Telephone Encounter (Signed)
Medication sent in for patient. 

## 2015-12-17 ENCOUNTER — Ambulatory Visit: Payer: Self-pay | Admitting: Family Medicine

## 2016-01-10 ENCOUNTER — Encounter: Payer: Self-pay | Admitting: Family Medicine

## 2016-01-10 ENCOUNTER — Ambulatory Visit (INDEPENDENT_AMBULATORY_CARE_PROVIDER_SITE_OTHER): Payer: PRIVATE HEALTH INSURANCE | Admitting: Family Medicine

## 2016-01-10 VITALS — BP 120/80 | HR 90 | Temp 98.0°F | Ht 60.0 in | Wt 198.8 lb

## 2016-01-10 DIAGNOSIS — M79662 Pain in left lower leg: Secondary | ICD-10-CM | POA: Diagnosis not present

## 2016-01-10 DIAGNOSIS — Z23 Encounter for immunization: Secondary | ICD-10-CM

## 2016-01-10 MED ORDER — NAPROXEN 500 MG PO TABS: 500.0000 mg | ORAL_TABLET | Freq: Every day | ORAL | 0 refills | Status: DC

## 2016-01-10 MED ORDER — CYCLOBENZAPRINE HCL 5 MG PO TABS: 5.0000 mg | ORAL_TABLET | Freq: Every evening | ORAL | 1 refills | Status: DC | PRN

## 2016-01-10 NOTE — Progress Notes (Signed)
HPI:  Acute visit for:  Calf pain: -L medial calf -started with new workout with trainer about 4-6 weeks ago -[facility] -worse with walking a lot, stairs, certain sitting positions -better with rest and ibuprofen -denies: popping, weakness, numbness, swelling, fevers, malaise  ROS: See pertinent positives and negatives per HPI.  Past Medical History:  Diagnosis Date  . Abnormal pap    w LEEP  . Alcoholism (HCC)   . Allergy   . Asthma   . Blood in stool   . Depression (emotion)   . Frequent headaches   . Hepatitis C, chronic (HCC)    hep c tx successful  . Irritable bowel syndrome   . OSA on CPAP - sees Dr. Jenne CampusMcQueen in ENT 10/09/2013  . Seasonal allergies    scratch test in 2006///mold issue in house    Past Surgical History:  Procedure Laterality Date  . DILATION AND CURETTAGE OF UTERUS  1996, 2001, 2002   x 3  . LEEP  2008  . NASAL FRACTURE SURGERY  1999   repair  . NASAL SEPTOPLASTY W/ TURBINOPLASTY      Family History  Problem Relation Age of Onset  . Emphysema Mother   . Heart disease Father   . Cancer Father     unsure??  . Stroke Maternal Grandmother   . Hypertension Father   . Osteoporosis Maternal Grandmother   . Hepatitis C Father   . Colon polyps Mother   . Diverticulitis Mother     Social History   Social History  . Marital status: Single    Spouse name: N/A  . Number of children: 0  . Years of education: N/A   Occupational History  . Human resources officergraphic designer [employer]   Social History Main Topics  . Smoking status: Former Smoker    Packs/day: 1.50    Years: 15.00    Types: Cigarettes    Quit date: 05/15/2005  . Smokeless tobacco: Never Used     Comment: quit in 2007, smoked for 13 years   . Alcohol use No     Comment: quit drinking on 2004---relapse in 2007---QUIT 07/2005  . Drug use: No     Comment: IV drug use///street drugs///inhalants  QUIT 07/2005  . Sexual activity: Not Asked   Other Topics  Concern  . None   Social History Narrative   Work or School: Glass blower/designergraphic designer      Home Situation: lives with husband      Spiritual Beliefs: none      Lifestyle: no regular exercise; diet is fair                 Current Outpatient Prescriptions:  .  albuterol (PROVENTIL HFA;VENTOLIN HFA) 108 (90 BASE) MCG/ACT inhaler, Inhale 2 puffs into the lungs every 6 (six) hours as needed., Disp: , Rfl:  .  ARIPiprazole (ABILIFY) 15 MG tablet, Take 15 mg by mouth daily.  , Disp: , Rfl:  .  buPROPion (WELLBUTRIN SR) 150 MG 12 hr tablet, Take 300 mg by mouth daily. , Disp: , Rfl:  .  cetirizine (ZYRTEC) 10 MG tablet, Take 10 mg by mouth daily., Disp: , Rfl:  .  dicyclomine (BENTYL) 10 MG capsule, Take 1 capsule (10 mg total) by mouth every 8 (eight) hours as needed., Disp: 60 capsule, Rfl: 3 .  levonorgestrel-ethinyl estradiol (LEVORA 0.15/30, 28,) 0.15-30 MG-MCG tablet, Take 1 tablet by mouth daily., Disp: 28 tablet, Rfl: 11 .  Multiple Vitamin (MULTIVITAMINS PO), Take by mouth., Disp: , Rfl:  .  ondansetron (ZOFRAN ODT) 8 MG disintegrating tablet, Take 1 tablet (8 mg total) by mouth every 8 (eight) hours as needed for nausea or vomiting., Disp: 15 tablet, Rfl: 0 .  SUMAtriptan (IMITREX) 100 MG tablet, May repeat in 2 hours if headache persists or recurs. No more than 2 in 24 hours., Disp: 10 tablet, Rfl: 1 .  triamcinolone cream (KENALOG) 0.1 %, Apply 1 application topically 2 (two) times daily., Disp: 30 g, Rfl: 0 .  cyclobenzaprine (FLEXERIL) 5 MG tablet, Take 1 tablet (5 mg total) by mouth at bedtime as needed for muscle spasms., Disp: 15 tablet, Rfl: 1 .  naproxen (NAPROSYN) 500 MG tablet, Take 1 tablet (500 mg total) by mouth daily., Disp: 30 tablet, Rfl: 0  EXAM:  Vitals:   01/10/16 0955  BP: 120/80  Pulse: 90  Temp: 98 F (36.7 C)    Body mass index is 38.83 kg/m.  GENERAL: vitals reviewed and listed above, alert, oriented, appears well hydrated and in no acute  distress  HEENT: atraumatic, conjunttiva clear, no obvious abnormalities on inspection of external nose and ears  NECK: no obvious masses on inspection  LUNGS: clear to auscultation bilaterally, no wheezes, rales or rhonchi, good air movement  CV: HRRR, no peripheral edema  MS: moves all extremities without noticeable abnormality, normal gait, on inspection of the legs and feet in the standing weightbearing position, she does have has planus that is more prominent on the left with mild talus valgus deformity; she has tenderness to palpation in the lower one third of the medial gastroc muscle; no swelling or redness; normal exam otherwise with preserved strength in neurovascularly intact   PSYCH: pleasant and cooperative, no obvious depression or anxiety  ASSESSMENT AND PLAN:  Discussed the following assessment and plan:  Calf pain, left  -Suspect strain, discussed other potential etiologies, obtained negative ultrasound report  -Opted for home exercise program, heat, muscle relaxer at night, short course of anti-inflammatory medication and close follow-up in 1 month  -Patient advised to return or notify a doctor immediately if symptoms worsen or persist or new concerns arise.  Patient Instructions  BEFORE YOU LEAVE: -flu shot -calf strain exercises -follow up: 1 month  Do the exercises 4 days per week.  Consider gait analysis at off and running.  Flexeril at night for 1-2 weeks.  Naproxen once daily for 1-2 weeks, then as needed - do not use with other pain medications.  Gentle regular walking, but not more then 1-2 miles for the next few weeks.    Kriste Basque R., DO

## 2016-01-10 NOTE — Patient Instructions (Signed)
BEFORE YOU LEAVE: -flu shot -calf strain exercises -follow up: 1 month  Do the exercises 4 days per week.  Consider gait analysis at off and running.  Flexeril at night for 1-2 weeks.  Naproxen once daily for 1-2 weeks, then as needed - do not use with other pain medications.  Gentle regular walking, but not more then 1-2 miles for the next few weeks.

## 2016-01-10 NOTE — Progress Notes (Signed)
Pre visit review using our clinic review tool, if applicable. No additional management support is needed unless otherwise documented below in the visit note. 

## 2016-02-03 ENCOUNTER — Other Ambulatory Visit: Payer: Self-pay | Admitting: Family Medicine

## 2016-02-07 ENCOUNTER — Encounter: Payer: Self-pay | Admitting: Family Medicine

## 2016-02-07 ENCOUNTER — Ambulatory Visit (INDEPENDENT_AMBULATORY_CARE_PROVIDER_SITE_OTHER): Payer: PRIVATE HEALTH INSURANCE | Admitting: Family Medicine

## 2016-02-07 VITALS — BP 100/78 | HR 103 | Temp 98.4°F | Ht 60.0 in | Wt 195.9 lb

## 2016-02-07 DIAGNOSIS — R911 Solitary pulmonary nodule: Secondary | ICD-10-CM | POA: Diagnosis not present

## 2016-02-07 DIAGNOSIS — J45991 Cough variant asthma: Secondary | ICD-10-CM

## 2016-02-07 DIAGNOSIS — M79605 Pain in left leg: Secondary | ICD-10-CM | POA: Diagnosis not present

## 2016-02-07 DIAGNOSIS — Z6838 Body mass index (BMI) 38.0-38.9, adult: Secondary | ICD-10-CM

## 2016-02-07 NOTE — Progress Notes (Signed)
HPI:  L calf pain/Obesity: -started about  6/17 after new workout with trainer -neg [place] at [facility] -she did the exercises and pain resolved -concerned about her weight and wants to work on this -no exercise recently -no diet currently, did weight watchers in the past -denies leg pain, weakness, numbness, swelling  Pulm nodules: -she did not do the repeat CT we ordered -she felt could not do it 2ndary to cost, but agreeable to setting up again if payment plan -no fevers, wt loss, sOB  ROS: See pertinent positives and negatives per HPI.  Past Medical History:  Diagnosis Date  . Abnormal pap    w LEEP  . Alcoholism (HCC)   . Allergy   . Asthma   . Blood in stool   . Depression (emotion)   . Frequent headaches   . Hepatitis C, chronic (HCC)    hep c tx successful  . Irritable bowel syndrome   . OSA on CPAP - sees Dr. Jenne Campus in ENT 10/09/2013  . Seasonal allergies    scratch test in 2006///mold issue in house    Past Surgical History:  Procedure Laterality Date  . DILATION AND CURETTAGE OF UTERUS  1996, 2001, 2002   x 3  . LEEP  2008  . NASAL FRACTURE SURGERY  1999   repair  . NASAL SEPTOPLASTY W/ TURBINOPLASTY      Family History  Problem Relation Age of Onset  . Emphysema Mother   . Heart disease Father   . Cancer Father     unsure??  . Stroke Maternal Grandmother   . Hypertension Father   . Osteoporosis Maternal Grandmother   . Hepatitis C Father   . Colon polyps Mother   . Diverticulitis Mother     Social History   Social History  . Marital status: Single    Spouse name: N/A  . Number of children: 0  . Years of education: N/A   Occupational History  . Human resources officer   Social History Main Topics  . Smoking status: Former Smoker    Packs/day: 1.50    Years: 15.00    Types: Cigarettes    Quit date: 05/15/2005  . Smokeless tobacco: Never Used     Comment: quit in 2007, smoked for 13 years   . Alcohol use No     Comment:  quit drinking on 2004---relapse in 2007---QUIT 07/2005  . Drug use: No     Comment: IV drug use///street drugs///inhalants  QUIT 07/2005  . Sexual activity: Not Asked   Other Topics Concern  . 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  albuterol (PROVENTIL HFA;VENTOLIN HFA) 108 (90 BASE) MCG/ACT inhaler, Inhale 2 puffs into the lungs every 6 (six) hours as needed., Disp: , Rfl:  .  ARIPiprazole (ABILIFY) 15 MG tablet, Take 15 mg by mouth daily.  , Disp: , Rfl:  .  buPROPion (WELLBUTRIN SR) 150 MG 12 hr tablet, Take 300 mg by mouth daily. , Disp: , Rfl:  .  cetirizine (ZYRTEC) 10 MG tablet, Take 10 mg by mouth daily., Disp: , Rfl:  .  dicyclomine (BENTYL) 10 MG capsule, Take 1  capsule (10 mg total) by mouth every 8 (eight) hours as needed., Disp: 60 capsule, Rfl: 3 .  levonorgestrel-ethinyl estradiol (LEVORA 0.15/30, 28,) 0.15-30 MG-MCG tablet, Take 1 tablet by mouth daily., Disp: 28 tablet, Rfl: 11 .  Multiple Vitamin (MULTIVITAMINS PO), Take by mouth., Disp: , Rfl:  .  ondansetron (ZOFRAN ODT) 8 MG disintegrating tablet, Take 1 tablet (8 mg total) by mouth every 8 (eight) hours as needed for nausea or vomiting., Disp: 15 tablet, Rfl: 0 .  SUMAtriptan (IMITREX) 100 MG tablet, May repeat in 2 hours if headache persists or recurs. No more than 2 in 24 hours., Disp: 10 tablet, Rfl: 1 .  triamcinolone cream (KENALOG) 0.1 %, Apply 1 application topically 2 (two) times daily., Disp: 30 g, Rfl: 0  EXAM:  Vitals:   02/07/16 0850  BP: 100/78  Pulse: (!) 103  Temp: 98.4 F (36.9 C)    Body mass index is 38.26 kg/m.  GENERAL: vitals reviewed and listed above, alert, oriented, appears well hydrated and in no acute distress  HEENT: atraumatic, conjunttiva clear, no obvious abnormalities  on inspection of external nose and ears  NECK: no obvious masses on inspection  LUNGS: clear to auscultation bilaterally, no wheezes, rales or rhonchi, good air movement  CV: HRRR, no peripheral edema  MS: moves all extremities without noticeable abnormality  PSYCH: pleasant and cooperative, no obvious depression or anxiety  ASSESSMENT AND PLAN:  Discussed the following assessment and plan:   Pain of left lower extremity  BMI 38.0-38.9,adult  Cough variant asthma  Pulmonary nodule - Plan: CT Chest W Contrast  -discussed lifestyle changes for weight reduction at length (see handout for summary) - I think the diet will be most important in terms of weight reduction -advise easing gradually back into aerobic exercise with flat surface walking for 20 minutes the first week, then gradually increasing to avoid injury -advised f/u on lung nodules and assistant provided number for her to receive help with payment plan or financial assistance if needed -Patient advised to return or notify a doctor immediately if symptoms worsen or persist or new concerns arise.  Patient Instructions  BEFORE YOU LEAVE: -follow up: CPE in 3 months  I'm so glad you are feeling better!  Please do CT for lung nodules.  We recommend the following healthy lifestyle for LIFE: 1) Small portions.   Tip: eat off of a salad plate instead of a dinner plate.  Tip: It is ok to feel hungry after a meal - that likely means you ate an appropriate portion.  Tip: if you need more or a snack choose fruits, veggies and/or a handful of nuts or seeds.  2) Eat a healthy clean diet.  * Tip: Avoid (less then 1 serving per week): processed foods, sweets, sweetened drinks, white starches (rice, flour, bread, potatoes, pasta, etc), red meat, fast foods, butter  *Tip: CHOOSE instead   * 5-9 servings per day of fresh or frozen fruits and vegetables (but not corn, potatoes, bananas, canned or dried fruit)   *nuts and seeds,  beans   *olives and olive oil   *small portions of lean meats such as fish and white chicken    *small portions of whole grains  3)Get at least 150 minutes of sweaty aerobic exercise per week.  4)Reduce stress - consider counseling, meditation and relaxation to balance other aspects of your life.    Kriste BasqueKIM, HANNAH R., DO

## 2016-02-07 NOTE — Progress Notes (Signed)
Pre visit review using our clinic review tool, if applicable. No additional management support is needed unless otherwise documented below in the visit note. 

## 2016-02-07 NOTE — Patient Instructions (Addendum)
BEFORE YOU LEAVE: -follow up: CPE in 3 months  I'm so glad you are feeling better!  Please do CT for lung nodules.  We recommend the following healthy lifestyle for LIFE: 1) Small portions.   Tip: eat off of a salad plate instead of a dinner plate.  Tip: It is ok to feel hungry after a meal - that likely means you ate an appropriate portion.  Tip: if you need more or a snack choose fruits, veggies and/or a handful of nuts or seeds.  2) Eat a healthy clean diet.  * Tip: Avoid (less then 1 serving per week): processed foods, sweets, sweetened drinks, white starches (rice, flour, bread, potatoes, pasta, etc), red meat, fast foods, butter  *Tip: CHOOSE instead   * 5-9 servings per day of fresh or frozen fruits and vegetables (but not corn, potatoes, bananas, canned or dried fruit)   *nuts and seeds, beans   *olives and olive oil   *small portions of lean meats such as fish and white chicken    *small portions of whole grains  3)Get at least 150 minutes of sweaty aerobic exercise per week.  4)Reduce stress - consider counseling, meditation and relaxation to balance other aspects of your life.

## 2016-02-08 ENCOUNTER — Other Ambulatory Visit: Payer: Self-pay | Admitting: Family Medicine

## 2016-02-08 NOTE — Telephone Encounter (Signed)
Requested med is over the counter - aleve. Would advise not to use on a regular basis but can use occasionally.

## 2016-02-25 ENCOUNTER — Ambulatory Visit (INDEPENDENT_AMBULATORY_CARE_PROVIDER_SITE_OTHER)
Admission: RE | Admit: 2016-02-25 | Discharge: 2016-02-25 | Disposition: A | Discharge status: Home / Self Care | Payer: PRIVATE HEALTH INSURANCE | Source: Ambulatory Visit | Attending: Family Medicine | Admitting: Family Medicine

## 2016-02-25 DIAGNOSIS — R911 Solitary pulmonary nodule: Secondary | ICD-10-CM

## 2016-02-25 MED ORDER — IOPAMIDOL (ISOVUE-300) INJECTION 61%
80.0000 mL | Freq: Once | INTRAVENOUS | Status: AC | PRN
  Administered 2016-02-25: 80 mL via INTRAVENOUS

## 2016-02-28 ENCOUNTER — Other Ambulatory Visit: Payer: Self-pay | Admitting: *Deleted

## 2016-02-28 DIAGNOSIS — R918 Other nonspecific abnormal finding of lung field: Secondary | ICD-10-CM

## 2016-04-14 ENCOUNTER — Encounter: Payer: Self-pay | Admitting: Family Medicine

## 2016-04-14 ENCOUNTER — Ambulatory Visit (INDEPENDENT_AMBULATORY_CARE_PROVIDER_SITE_OTHER): Payer: PRIVATE HEALTH INSURANCE | Admitting: Family Medicine

## 2016-04-14 VITALS — BP 124/78 | HR 90 | Temp 98.3°F | Wt 197.4 lb

## 2016-04-14 DIAGNOSIS — J069 Acute upper respiratory infection, unspecified: Secondary | ICD-10-CM

## 2016-04-14 MED ORDER — BENZONATATE 100 MG PO CAPS: 100.0000 mg | ORAL_CAPSULE | Freq: Two times a day (BID) | ORAL | 0 refills | Status: DC | PRN

## 2016-04-14 NOTE — Progress Notes (Signed)
HPI:  Acute visit for URI: -started: 3 days ago -symptoms:nasal congestion, sore throat, cough, musinex made her vomit and gave her diarrhea  -denies:fever, SOB, wheezing, body aches, melena, hematochezia, bilious or blood emesis, tooth pain -has tried: new form of musinex -sick contacts/travel/risks: no reported flu, strep or tick exposure -Hx of: asthma - has not needed her albuterol  ROS: See pertinent positives and negatives per HPI.  Past Medical History:  Diagnosis Date  . Abnormal pap    w LEEP  . Alcoholism (HCC)   . Allergy   . Asthma   . Blood in stool   . Depression (emotion)   . Frequent headaches   . Hepatitis C, chronic (HCC)    hep c tx successful  . Irritable bowel syndrome   . OSA on CPAP - sees Dr. Jenne Campus in ENT 10/09/2013  . Seasonal allergies    scratch test in 2006///mold issue in house    Past Surgical History:  Procedure Laterality Date  . DILATION AND CURETTAGE OF UTERUS  1996, 2001, 2002   x 3  . LEEP  2008  . NASAL FRACTURE SURGERY  1999   repair  . NASAL SEPTOPLASTY W/ TURBINOPLASTY      Family History  Problem Relation Age of Onset  . Emphysema Mother   . Heart disease Father   . Cancer Father     unsure??  . Stroke Maternal Grandmother   . Hypertension Father   . Osteoporosis Maternal Grandmother   . Hepatitis C Father   . Colon polyps Mother   . Diverticulitis Mother     Social History   Social History  . Marital status: Single    Spouse name: N/A  . Number of children: 0  . Years of education: N/A   Occupational History  . Human resources officer   Social History Main Topics  . Smoking status: Former Smoker    Packs/day: 1.50    Years: 15.00    Types: Cigarettes    Quit date: 05/15/2005  . Smokeless tobacco: Never Used     Comment: quit in 2007, smoked for 13 years   . Alcohol use No     Comment: quit drinking on 2004---relapse in 2007---QUIT 07/2005  . Drug use: No     Comment: IV drug  use///street drugs///inhalants  QUIT 07/2005  . Sexual activity: Not Asked   Other Topics Concern  . 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  albuterol (PROVENTIL HFA;VENTOLIN HFA) 108 (90 BASE) MCG/ACT inhaler, Inhale 2 puffs into the lungs every 6 (six) hours as needed., Disp: , Rfl:  .  ARIPiprazole (ABILIFY) 15 MG tablet, Take 15 mg by mouth daily.  , Disp: , Rfl:  .  buPROPion (WELLBUTRIN SR) 150 MG 12 hr tablet, Take 300 mg by mouth daily. , Disp: , Rfl:  .  cetirizine (ZYRTEC) 10 MG tablet, Take 10 mg by mouth daily., Disp: , Rfl:  .  dicyclomine (BENTYL) 10 MG capsule, Take 1 capsule (10 mg total) by mouth every 8 (eight) hours as needed., Disp: 60 capsule, Rfl: 3 .  levonorgestrel-ethinyl estradiol (LEVORA 0.15/30, 28,) 0.15-30 MG-MCG  tablet, Take 1 tablet by mouth daily., Disp: 28 tablet, Rfl: 11 .  Multiple Vitamin (MULTIVITAMINS PO), Take by mouth., Disp: , Rfl:  .  ondansetron (ZOFRAN ODT) 8 MG disintegrating tablet, Take 1 tablet (8 mg total) by mouth every 8 (eight) hours as needed for nausea or vomiting., Disp: 15 tablet, Rfl: 0 .  SUMAtriptan (IMITREX) 100 MG tablet, May repeat in 2 hours if headache persists or recurs. No more than 2 in 24 hours., Disp: 10 tablet, Rfl: 1 .  triamcinolone cream (KENALOG) 0.1 %, Apply 1 application topically 2 (two) times daily., Disp: 30 g, Rfl: 0 .  benzonatate (TESSALON) 100 MG capsule, Take 1 capsule (100 mg total) by mouth 2 (two) times daily as needed for cough., Disp: 20 capsule, Rfl: 0  EXAM:  Vitals:   04/14/16 1354  BP: 124/78  Pulse: 90  Temp: 98.3 F (36.8 C)    Body mass index is 38.55 kg/m.  GENERAL: vitals reviewed and listed above, alert, oriented, appears well hydrated and in no acute distress  HEENT:  atraumatic, conjunttiva clear, no obvious abnormalities on inspection of external nose and ears, normal appearance of ear canals and TMs, clear nasal congestion, mild post oropharyngeal erythema with PND, no tonsillar edema or exudate, no sinus TTP  NECK: no obvious masses on inspection  LUNGS: clear to auscultation bilaterally, no wheezes, rales or rhonchi, good air movement  CV: HRRR, no peripheral edema  MS: moves all extremities without noticeable abnormality  PSYCH: pleasant and cooperative, no obvious depression or anxiety  ASSESSMENT AND PLAN:  Discussed the following assessment and plan:  Acute upper respiratory infection  -given HPI and exam findings today, a serious infection or illness is unlikely. We discussed potential etiologies, with VURI being most likely, and advised supportive care and monitoring. No breathing difficulty or sig asthma symptoms, but advised alb if needed and prompt follow up if worsening cough or asthma symptoms. Tessalon for cough. We discussed treatment side effects, likely course, antibiotic misuse, transmission, and signs of developing a serious illness. -of course, we advised to return or notify a doctor immediately if symptoms worsen or persist or new concerns arise.    Patient Instructions  INSTRUCTIONS FOR UPPER RESPIRATORY INFECTION:  -I sent the cough medication to your pharmacy (tessalon)  -use the albuterol if needed and follow up promptly if any asthma symptoms not responding to albuterol or if you are worsening   -nasal saline wash 2-3 times daily (use prepackaged nasal saline or bottled/distilled water if making your own)   -can use  Ibuprofen or aleve (if no history of kidney disease, bowel bleeding or significant heart disease) as directed for aches and sorethroat  -in the winter time, using a humidifier at night is helpful (please follow cleaning instructions)  -if you are taking a cough medication - use only as directed, may  also try a teaspoon of honey to coat the throat and throat lozenges.  -for sore throat, salt water gargles can help  -follow up if you have fevers, facial pain, tooth pain, difficulty breathing or are worsening or symptoms persist longer then expected  Upper Respiratory Infection, Adult An upper respiratory infection (URI) is also known as the common cold. It is often caused by a type of germ (virus). Colds are easily spread (contagious). You can pass it to others by kissing, coughing, sneezing, or drinking out of the same glass. Usually, you get better in 1 to 3  weeks.  However, the cough can  last for even longer. HOME CARE   Only take medicine as told by your doctor. Follow instructions provided above.  Drink enough water and fluids to keep your pee (urine) clear or pale yellow.  Get plenty of rest.  Return to work when your temperature is < 100 for 24 hours or as told by your doctor. You may use a face mask and wash your hands to stop your cold from spreading. GET HELP RIGHT AWAY IF:   After the first few days, you feel you are getting worse.  You have questions about your medicine.  You have chills, shortness of breath, or red spit (mucus).  You have pain in the face for more then 1-2 days, especially when you bend forward.  You have a fever, puffy (swollen) neck, pain when you swallow, or white spots in the back of your throat.  You have a bad headache, ear pain, sinus pain, or chest pain.  You have a high-pitched whistling sound when you breathe in and out (wheezing).  You cough up blood.  You have sore muscles or a stiff neck. MAKE SURE YOU:   Understand these instructions.  Will watch your condition.  Will get help right away if you are not doing well or get worse. Document Released: 10/18/2007 Document Revised: 07/24/2011 Document Reviewed: 08/06/2013 [facility] Patient Information 2015 [place], [place]. This information is not intended to replace advice given to  you by your health care provider. Make sure you discuss any questions you have with your health care provider.    Kriste BasqueKIM, HANNAH R., DO

## 2016-04-14 NOTE — Patient Instructions (Signed)
INSTRUCTIONS FOR UPPER RESPIRATORY INFECTION:  -I sent the cough medication to your pharmacy (tessalon)  -use the albuterol if needed and follow up promptly if any asthma symptoms not responding to albuterol or if you are worsening   -nasal saline wash 2-3 times daily (use prepackaged nasal saline or bottled/distilled water if making your own)   -can use  Ibuprofen or aleve (if no history of kidney disease, bowel bleeding or significant heart disease) as directed for aches and sorethroat  -in the winter time, using a humidifier at night is helpful (please follow cleaning instructions)  -if you are taking a cough medication - use only as directed, may also try a teaspoon of honey to coat the throat and throat lozenges.  -for sore throat, salt water gargles can help  -follow up if you have fevers, facial pain, tooth pain, difficulty breathing or are worsening or symptoms persist longer then expected  Upper Respiratory Infection, Adult An upper respiratory infection (URI) is also known as the common cold. It is often caused by a type of germ (virus). Colds are easily spread (contagious). You can pass it to others by kissing, coughing, sneezing, or drinking out of the same glass. Usually, you get better in 1 to 3  weeks.  However, the cough can last for even longer. HOME CARE   Only take medicine as told by your doctor. Follow instructions provided above.  Drink enough water and fluids to keep your pee (urine) clear or pale yellow.  Get plenty of rest.  Return to work when your temperature is < 100 for 24 hours or as told by your doctor. You may use a face mask and wash your hands to stop your cold from spreading. GET HELP RIGHT AWAY IF:   After the first few days, you feel you are getting worse.  You have questions about your medicine.  You have chills, shortness of breath, or red spit (mucus).  You have pain in the face for more then 1-2 days, especially when you bend  forward.  You have a fever, puffy (swollen) neck, pain when you swallow, or white spots in the back of your throat.  You have a bad headache, ear pain, sinus pain, or chest pain.  You have a high-pitched whistling sound when you breathe in and out (wheezing).  You cough up blood.  You have sore muscles or a stiff neck. MAKE SURE YOU:   Understand these instructions.  Will watch your condition.  Will get help right away if you are not doing well or get worse. Document Released: 10/18/2007 Document Revised: 07/24/2011 Document Reviewed: 08/06/2013 [facility]ExitCare Patient Information 2015 [place], [place]. This information is not intended to replace advice given to you by your health care provider. Make sure you discuss any questions you have with your health care provider.

## 2016-04-14 NOTE — Progress Notes (Signed)
Pre visit review using our clinic review tool, if applicable. No additional management support is needed unless otherwise documented below in the visit note. 

## 2016-04-27 ENCOUNTER — Encounter: Payer: PRIVATE HEALTH INSURANCE | Admitting: Family Medicine

## 2016-05-24 DIAGNOSIS — G43909 Migraine, unspecified, not intractable, without status migrainosus: Secondary | ICD-10-CM | POA: Insufficient documentation

## 2016-05-24 DIAGNOSIS — I872 Venous insufficiency (chronic) (peripheral): Secondary | ICD-10-CM | POA: Insufficient documentation

## 2016-05-24 DIAGNOSIS — K589 Irritable bowel syndrome without diarrhea: Secondary | ICD-10-CM | POA: Insufficient documentation

## 2016-05-24 NOTE — Progress Notes (Signed)
HPI:  Here for CPE:  -Concerns and/or follow up today: none  PMH Depression (sees Dr. Evelene Croon), Asthma, Allergies, OSA (sees Pulm), treated Hep C, IBS (sees Dr. Adela Lank), Pulm nodule,  Migraine and venous insufficiency.  Due for: -repeat CT chest to f/u on pulm nodule in 1-2 years, pap, labs  -Diet: poor  -Exercise:regular exercise  -Taking folic acid, vitamin D or calcium: yes  -Diabetes and Dyslipidemia Screening: fasting for labs  -Hx of HTN: no  -Vaccines: UTD  -pap history: hx abn pap w/ LEEP 2009, gynecologist is Douglass Rivers; neg paps since with neg pap and HPV negative in 2015  -FDLMP: 05/08/16  -sexual activity: yes, female partner, no new partners  -wants STI testing (Hep C if born [birth year]): no  -FH breast, colon or ovarian ca: see FH Last mammogram: n/a Last colon cancer screening: n/a   -Alcohol, Tobacco, drug use: see social history  Review of Systems - no fevers, unintentional weight loss, vision loss, hearing loss, chest pain, sob, hemoptysis, melena, hematochezia, hematuria, genital discharge, changing or concerning skin lesions, bleeding, bruising, loc, thoughts of self harm or SI  Past Medical History:  Diagnosis Date  . Abnormal pap    w LEEP  . Alcoholism (HCC)   . Allergy   . Asthma   . Blood in stool   . Depression (emotion)   . Frequent headaches   . Hepatitis C, chronic (HCC)    hep c tx successful  . Irritable bowel syndrome   . OSA on CPAP - sees Dr. Jenne Campus in ENT 10/09/2013  . Seasonal allergies    scratch test in 2006///mold issue in house    Past Surgical History:  Procedure Laterality Date  . DILATION AND CURETTAGE OF UTERUS  1996, 2001, 2002   x 3  . LEEP  2008  . NASAL FRACTURE SURGERY  1999   repair  . NASAL SEPTOPLASTY W/ TURBINOPLASTY      Family History  Problem Relation Age of Onset  . Emphysema Mother   . Heart disease Father   . Cancer Father     unsure??  . Stroke Maternal Grandmother   . Hypertension  Father   . Osteoporosis Maternal Grandmother   . Hepatitis C Father   . Colon polyps Mother   . Diverticulitis Mother     Social History   Social History  . Marital status: Single    Spouse name: N/A  . Number of children: 0  . Years of education: N/A   Occupational History  . Human resources officer   Social History Main Topics  . Smoking status: Former Smoker    Packs/day: 1.50    Years: 15.00    Types: Cigarettes    Quit date: 05/15/2005  . Smokeless tobacco: Never Used     Comment: quit in 2007, smoked for 13 years   . Alcohol use No     Comment: quit drinking on 2004---relapse in 2007---QUIT 07/2005  . Drug use: No     Comment: IV drug use///street drugs///inhalants  QUIT 07/2005  . Sexual activity: Not Asked   Other Topics Concern  . None   Social History Narrative   Work or School: Glass blower/designer Situation: lives with husband      Spiritual Beliefs: none      Lifestyle: no regular exercise; diet is fair                 Current  Outpatient Prescriptions:  .  albuterol (PROVENTIL HFA;VENTOLIN HFA) 108 (90 BASE) MCG/ACT inhaler, Inhale 2 puffs into the lungs every 6 (six) hours as needed., Disp: , Rfl:  .  ARIPiprazole (ABILIFY) 15 MG tablet, Take 15 mg by mouth daily.  , Disp: , Rfl:  .  buPROPion (WELLBUTRIN SR) 150 MG 12 hr tablet, Take 300 mg by mouth daily. , Disp: , Rfl:  .  cetirizine (ZYRTEC) 10 MG tablet, Take 10 mg by mouth daily., Disp: , Rfl:  .  dicyclomine (BENTYL) 10 MG capsule, Take 1 capsule (10 mg total) by mouth every 8 (eight) hours as needed., Disp: 60 capsule, Rfl: 3 .  levonorgestrel-ethinyl estradiol (LEVORA 0.15/30, 28,) 0.15-30 MG-MCG tablet, Take 1 tablet by mouth daily., Disp: 28 tablet, Rfl: 11 .  Multiple Vitamin (MULTIVITAMINS PO), Take by mouth., Disp: , Rfl:  .  ondansetron (ZOFRAN ODT) 8 MG disintegrating tablet, Take 1 tablet (8 mg total) by mouth every 8 (eight) hours as needed for nausea or  vomiting., Disp: 15 tablet, Rfl: 0 .  SUMAtriptan (IMITREX) 100 MG tablet, May repeat in 2 hours if headache persists or recurs. No more than 2 in 24 hours., Disp: 10 tablet, Rfl: 1 .  triamcinolone cream (KENALOG) 0.1 %, Apply 1 application topically 2 (two) times daily., Disp: 30 g, Rfl: 0  EXAM:  Vitals:   05/25/16 0711  BP: 100/76  Pulse: 82  Temp: 97.8 F (36.6 C)  Body mass index is 37.31 kg/m.   GENERAL: vitals reviewed and listed below, alert, oriented, appears well hydrated and in no acute distress  HEENT: head atraumatic, PERRLA, normal appearance of eyes, ears, nose and mouth. moist mucus membranes.  NECK: supple, no masses or lymphadenopathy  LUNGS: clear to auscultation bilaterally, no rales, rhonchi or wheeze  CV: HRRR, no peripheral edema or cyanosis, normal pedal pulses  ABDOMEN: bowel sounds normal, soft, non tender to palpation, no masses, no rebound or guarding  BREAST: normal appearance - no skin lesions or discharge noted on inspection of both breasts, on palpation of both breast and axillary region no suspicious lesions appreciated today  GU: normal appearance of external genitalia - no lesions or masses appreciated, normal appearing vaginal mucosa - no abnormal discharge, normal appearance of cervix - no lesions or abnormal discharge observed. Pap obtained.  RECTAL: deferred  SKIN: no rash or abnormal lesions  MS: normal gait, moves all extremities normally  NEURO: normal gait, speech and thought processing grossly intact, muscle tone grossly intact throughout  PSYCH: normal affect, pleasant and cooperative  ASSESSMENT AND PLAN:  Discussed the following assessment and plan:  Encounter for preventive health examination - Plan: Lipid Panel, Hemoglobin A1c  BMI 37.0-37.9, adult  Women's annual routine gynecological examination  -pap obtained  -Discussed and advised all [place] preventive services health task force level A and B recommendations for  age, sex and risks.  -Advised at least 150 minutes of exercise per week and a healthy diet with avoidance of (less then 1 serving per week) processed foods, white starches, red meat, fast foods and sweets and consisting of: * 5-9 servings of fresh fruits and vegetables (not corn or potatoes) *nuts and seeds, beans *olives and olive oil *lean meats such as fish and white chicken  *whole grains  -labs, studies and vaccines per orders this encounter  Orders Placed This Encounter  Procedures  . Lipid Panel  . Hemoglobin A1c    Patient advised to return to clinic immediately if symptoms worsen  or persist or new concerns.  Patient Instructions  BEFORE YOU LEAVE: -follow up: yearly for physical -labs  We have ordered a pap smear and labs at this visit. It can take up to 1-2 weeks for results and processing. IF results require follow up or explanation, we will call you with instructions. Clinically stable results will be released to your [facility]. If you have not heard from [place] or cannot find your results in [facility] in 2 weeks please contact our office at [phone].  If you are not yet signed up for [facility], please consider signing up.   We recommend the following healthy lifestyle for LIFE: 1) Small portions.   Tip: eat off of a salad plate instead of a dinner plate.  Tip: It is ok to feel hungry after a meal of proper portion sizes  Tip: if you need more or a snack choose fruits, veggies and/or a handful of nuts or seeds.  2) Eat a healthy clean diet.  * Tip: Avoid (less then 1 serving per week): processed foods, sweets, sweetened drinks, white starches (rice, flour, bread, potatoes, pasta, etc), red meat, fast foods, butter  *Tip: CHOOSE instead   * 5-9 servings per day of fresh or frozen fruits and vegetables (but not corn, potatoes, bananas, canned or dried fruit)   *nuts and seeds, beans   *olives and olive oil   *small portions of lean meats such as fish and white chicken     *small portions of whole grains  3)Get at least 150 minutes of sweaty aerobic exercise per week.  4)Reduce stress - consider counseling, meditation and relaxation to balance other aspects of your life.            No Follow-up on file.  Kriste Basque R., DO

## 2016-05-25 ENCOUNTER — Encounter: Payer: Self-pay | Admitting: Family Medicine

## 2016-05-25 ENCOUNTER — Other Ambulatory Visit (HOSPITAL_COMMUNITY)
Admission: RE | Admit: 2016-05-25 | Discharge: 2016-05-25 | Disposition: A | Discharge status: Home / Self Care | Payer: PRIVATE HEALTH INSURANCE | Source: Ambulatory Visit | Attending: Family Medicine | Admitting: Family Medicine

## 2016-05-25 ENCOUNTER — Ambulatory Visit (INDEPENDENT_AMBULATORY_CARE_PROVIDER_SITE_OTHER): Payer: PRIVATE HEALTH INSURANCE | Admitting: Family Medicine

## 2016-05-25 VITALS — BP 100/76 | HR 82 | Temp 97.8°F | Ht 61.25 in | Wt 199.1 lb

## 2016-05-25 DIAGNOSIS — Z01419 Encounter for gynecological examination (general) (routine) without abnormal findings: Secondary | ICD-10-CM | POA: Diagnosis not present

## 2016-05-25 DIAGNOSIS — Z1151 Encounter for screening for human papillomavirus (HPV): Secondary | ICD-10-CM | POA: Insufficient documentation

## 2016-05-25 DIAGNOSIS — Z124 Encounter for screening for malignant neoplasm of cervix: Secondary | ICD-10-CM | POA: Diagnosis not present

## 2016-05-25 DIAGNOSIS — Z0000 Encounter for general adult medical examination without abnormal findings: Secondary | ICD-10-CM | POA: Diagnosis not present

## 2016-05-25 DIAGNOSIS — Z6837 Body mass index (BMI) 37.0-37.9, adult: Secondary | ICD-10-CM

## 2016-05-25 LAB — LIPID PANEL
CHOLESTEROL: 179 mg/dL (ref 0–200)
HDL: 50.8 mg/dL (ref >39.00)
LDL Cholesterol: 102 mg/dL — ABNORMAL HIGH (ref 0–99)
NONHDL: 127.74
Total CHOL/HDL Ratio: 4
Triglycerides: 131 mg/dL (ref 0.0–149.0)
VLDL: 26.2 mg/dL (ref 0.0–40.0)

## 2016-05-25 LAB — HEMOGLOBIN A1C: HEMOGLOBIN A1C: 5.3 % (ref 4.6–6.5)

## 2016-05-25 NOTE — Addendum Note (Signed)
Addended by: Johnella MoloneyFUNDERBURK, JO A on: 05/25/2016 07:46 AM   Modules accepted: Orders

## 2016-05-25 NOTE — Progress Notes (Signed)
Pre visit review using our clinic review tool, if applicable. No additional management support is needed unless otherwise documented below in the visit note. 

## 2016-05-25 NOTE — Patient Instructions (Addendum)
BEFORE YOU LEAVE: -follow up: yearly for physical -labs  We have ordered a pap smear and labs at this visit. It can take up to 1-2 weeks for results and processing. IF results require follow up or explanation, we will call you with instructions. Clinically stable results will be [facility] to your [facility]. If you have not heard from us or cannot find your results in [facility] in 2 weeks please contact our office at [phone].  If you are not yet signed up for [facility], please consider signing up.   We recommend the following healthy lifestyle for LIFE: 1) Small portions.   Tip: eat off of a salad plate instead of a dinner plate.  Tip: It is ok to feel hungry after a meal of proper portion sizes  Tip: if you need more or a snack choose fruits, veggies and/or a handful of nuts or seeds.  2) Eat a healthy clean diet.  * Tip: Avoid (less then 1 serving per week): processed foods, sweets, sweetened drinks, white starches (rice, flour, bread, potatoes, pasta, etc), red meat, fast foods, butter  *Tip: CHOOSE instead   * 5-9 servings per day of fresh or frozen fruits and vegetables (but not corn, potatoes, bananas, canned or dried fruit)   *nuts and seeds, beans   *olives and olive oil   *small portions of lean meats such as fish and white chicken    *small portions of whole grains  3)Get at least 150 minutes of sweaty aerobic exercise per week.  4)Reduce stress - consider counseling, meditation and relaxation to balance other aspects of your life.

## 2016-05-26 LAB — CYTOLOGY - PAP
DIAGNOSIS: NEGATIVE
HPV: NOT DETECTED

## 2016-10-17 ENCOUNTER — Other Ambulatory Visit: Payer: Self-pay | Admitting: Family Medicine

## 2016-10-19 ENCOUNTER — Telehealth: Payer: Self-pay | Admitting: Family Medicine

## 2016-10-19 NOTE — Telephone Encounter (Signed)
Rx was sent to the pts pharmacy by Dr Selena BattenKim.

## 2016-10-19 NOTE — Telephone Encounter (Signed)
° °  Pt said she does not know why Dr Caryl NeverBurchette is showing as the prescriber for her birth control pills   pt request refill of the following:  levonorgestrel-ethinyl estradiol (LEVORA 0.15/30, 28,) 0.15-30 MG-MCG tablet   Phamacy:  [facility] at [address]

## 2017-01-22 ENCOUNTER — Other Ambulatory Visit: Payer: Self-pay | Admitting: Family Medicine

## 2017-01-24 NOTE — Telephone Encounter (Signed)
She was seeing psychiatrist I believe? We haven't seen her in some times and would need appt if we were to rx. Thanks.

## 2017-01-25 ENCOUNTER — Telehealth: Payer: Self-pay | Admitting: Family Medicine

## 2017-01-25 NOTE — Telephone Encounter (Signed)
Noted  

## 2017-01-25 NOTE — Telephone Encounter (Signed)
Randa EvensJoanne pt is calling stating that the pharmacy has the wrong prescriber and she will be contacting th pharmacy to get it corrected stated that Dr. Selena BattenKim was not the one that gave that to her.  If you should have any questions please give her a call.

## 2017-01-25 NOTE — Telephone Encounter (Signed)
I left a detailed message with the information below at the pts cell number and Rx denial was sent to her pharmacy.

## 2017-02-01 ENCOUNTER — Encounter: Payer: Self-pay | Admitting: Family Medicine

## 2017-04-10 ENCOUNTER — Ambulatory Visit (INDEPENDENT_AMBULATORY_CARE_PROVIDER_SITE_OTHER): Payer: PRIVATE HEALTH INSURANCE | Admitting: Family Medicine

## 2017-04-10 ENCOUNTER — Encounter: Payer: Self-pay | Admitting: Family Medicine

## 2017-04-10 VITALS — BP 102/76 | HR 75 | Temp 97.8°F | Ht 61.25 in | Wt 204.4 lb

## 2017-04-10 DIAGNOSIS — J069 Acute upper respiratory infection, unspecified: Secondary | ICD-10-CM | POA: Diagnosis not present

## 2017-04-10 DIAGNOSIS — F339 Major depressive disorder, recurrent, unspecified: Secondary | ICD-10-CM | POA: Diagnosis not present

## 2017-04-10 DIAGNOSIS — J4520 Mild intermittent asthma, uncomplicated: Secondary | ICD-10-CM

## 2017-04-10 MED ORDER — BENZONATATE 100 MG PO CAPS: 100.0000 mg | ORAL_CAPSULE | Freq: Three times a day (TID) | ORAL | 0 refills | Status: DC | PRN

## 2017-04-10 MED ORDER — ALBUTEROL SULFATE HFA 108 (90 BASE) MCG/ACT IN AERS: 2.0000 | INHALATION_SPRAY | Freq: Four times a day (QID) | RESPIRATORY_TRACT | 5 refills | Status: DC | PRN

## 2017-04-10 NOTE — Progress Notes (Addendum)
HPI:  Acute visit for respiratory illness: -started: 2 days ago -symptoms:nasal congestion, sore throat, cough, sneezing -denies:fever, SOB, NVD, tooth pain -has tried: Used her albuterol once or twice, but reports her asthma symptoms are not bad, she does not feel that she needs prednisone or escalated care for her asthma at this point -sick contacts/travel/risks: no reported flu, strep or tick exposure -Hx of: Asthma and allergies  Sees psychiatry for her depression.  Well controlled at current time.  Recent change in medications.  See depression screening. ROS: See pertinent positives and negatives per HPI.  Past Medical History:  Diagnosis Date  . Abnormal pap    w LEEP  . Alcoholism (HCC)   . Allergy   . Asthma   . Blood in stool   . Depression (emotion)   . Frequent headaches   . Hepatitis C, chronic (HCC)    hep c tx successful  . Irritable bowel syndrome   . OSA on CPAP - sees Dr. Jenne CampusMcQueen in ENT 10/09/2013  . Seasonal allergies    scratch test in 2006///mold issue in house    Past Surgical History:  Procedure Laterality Date  . DILATION AND CURETTAGE OF UTERUS  1996, 2001, 2002   x 3  . LEEP  2008  . NASAL FRACTURE SURGERY  1999   repair  . NASAL SEPTOPLASTY W/ TURBINOPLASTY      Family History  Problem Relation Age of Onset  . Emphysema Mother   . Heart disease Father   . Cancer Father        unsure??  . Stroke Maternal Grandmother   . Hypertension Father   . Osteoporosis Maternal Grandmother   . Hepatitis C Father   . Colon polyps Mother   . Diverticulitis Mother     Social History   Socioeconomic History  . Marital status: Single    Spouse name: None  . Number of children: 0  . Years of education: None  . Highest education level: None  Social Needs  . Financial resource strain: None  . Food insecurity - worry: None  . Food insecurity - inability: None  . Transportation needs - medical: None  . Transportation needs - non-medical: None    Occupational History  . Occupation: Adult nursegraphic designer    Employer: [employer]  Tobacco Use  . Smoking status: Former Smoker    Packs/day: 1.50    Years: 15.00    Pack years: 22.50    Types: Cigarettes    Last attempt to quit: 05/15/2005    Years since quitting: 11.9  . Smokeless tobacco: Never Used  . Tobacco comment: quit in 2007, smoked for 13 years   Substance and Sexual Activity  . Alcohol use: No    Alcohol/week: 0.0 oz    Comment: quit drinking on 2004---relapse in 2007---QUIT 07/2005  . Drug use: No    Comment: IV drug use///street drugs///inhalants  QUIT 07/2005  . Sexual activity: None  Other Topics Concern  . None  Social History Narrative   Work or School: Glass blower/designergraphic designer      Home Situation: lives with husband      Spiritual Beliefs: none      Lifestyle: no regular exercise; diet is fair                 Current Outpatient Medications:  .  albuterol (PROVENTIL HFA;VENTOLIN HFA) 108 (90 Base) MCG/ACT inhaler, Inhale 2 puffs into the lungs every 6 (six) hours as needed.,  Disp: 1 Inhaler, Rfl: 5 .  ALTAVERA 0.15-30 MG-MCG tablet, TAKE 1 TABLET BY MOUTH DAILY, Disp: 84 tablet, Rfl: 2 .  ARIPiprazole (ABILIFY) 15 MG tablet, Take 15 mg by mouth daily.  , Disp: , Rfl:  .  cetirizine (ZYRTEC) 10 MG tablet, Take 10 mg by mouth daily., Disp: , Rfl:  .  desvenlafaxine (PRISTIQ) 50 MG 24 hr tablet, TK 1 T PO QAM, Disp: , Rfl: 11 .  dicyclomine (BENTYL) 10 MG capsule, Take 1 capsule (10 mg total) by mouth every 8 (eight) hours as needed., Disp: 60 capsule, Rfl: 3 .  Multiple Vitamin (MULTIVITAMINS PO), Take by mouth., Disp: , Rfl:  .  ondansetron (ZOFRAN ODT) 8 MG disintegrating tablet, Take 1 tablet (8 mg total) by mouth every 8 (eight) hours as needed for nausea or vomiting., Disp: 15 tablet, Rfl: 0 .  SUMAtriptan (IMITREX) 100 MG tablet, May repeat in 2 hours if headache persists or recurs. No more than 2 in 24 hours., Disp: 10 tablet, Rfl: 1 .   triamcinolone cream (KENALOG) 0.1 %, Apply 1 application topically 2 (two) times daily., Disp: 30 g, Rfl: 0 .  benzonatate (TESSALON PERLES) 100 MG capsule, Take 1 capsule (100 mg total) by mouth 3 (three) times daily as needed., Disp: 20 capsule, Rfl: 0  EXAM:  Vitals:   04/10/17 1307  BP: 102/76  Pulse: 75  Temp: 97.8 F (36.6 C)  SpO2: 98%    Body mass index is 38.31 kg/m.  GENERAL: vitals reviewed and listed above, alert, oriented, appears well hydrated and in no acute distress  HEENT: atraumatic, conjunttiva clear, no obvious abnormalities on inspection of external nose and ears, normal appearance of ear canals and TMs, clear nasal congestion, mild post oropharyngeal erythema with PND, no tonsillar edema or exudate, no sinus TTP  NECK: no obvious masses on inspection  LUNGS: clear to auscultation bilaterally, no wheezes, rales or rhonchi, good air movement  CV: HRRR, no peripheral edema  MS: moves all extremities without noticeable abnormality  PSYCH: pleasant and cooperative, no obvious depression or anxiety  ASSESSMENT AND PLAN:  Discussed the following assessment and plan:  Viral upper respiratory illness  Mild intermittent asthma, unspecified whether complicated  Episode of recurrent major depressive disorder, unspecified depression episode severity (HCC)  -given HPI and exam findings today, a serious infection or illness is unlikely. We discussed potential etiologies, with VURI being most likely, and advised supportive care and monitoring. We discussed treatment side effects, likely course, antibiotic misuse, transmission, and signs of developing a serious illness. -Refilled albuterol to use as needed, Tessalon for cough -Advised prompt follow-up if asthma symptoms worsening or not responding to albuterol -of course, we advised to return or notify a doctor immediately if symptoms worsen or persist or new concerns arise.  Low depression screening score.  See  psychiatry for management.  See scanned documentation.  Patient Instructions  INSTRUCTIONS FOR UPPER RESPIRATORY INFECTION:  -plenty of rest and fluids  -nasal saline wash 2-3 times daily (use prepackaged nasal saline or bottled/distilled water if making your own)   -can use AFRIN nasal spray for drainage and nasal congestion - but do NOT use longer then 3-4 days  -can use tylenol (in no history of liver disease) or ibuprofen (if no history of kidney disease, bowel bleeding or significant heart disease) as directed for aches and sorethroat  -in the winter time, using a humidifier at night is helpful (please follow cleaning instructions)  -if you are taking a  cough medication - use only as directed, may also try a teaspoon of honey to coat the throat and throat lozenges.  I sent Tessalon to the pharmacy to use as needed.  -Use her albuterol as needed and follow-up promptly if your asthma is worsening or if you have any asthma symptoms that do not respond to the albuterol  -for sore throat, salt water gargles can help  -follow up if you have fevers, facial pain, tooth pain, difficulty breathing or are worsening or symptoms persist longer then expected  Upper Respiratory Infection, Adult An upper respiratory infection (URI) is also known as the common cold. It is often caused by a type of germ (virus). Colds are easily spread (contagious). You can pass it to others by kissing, coughing, sneezing, or drinking out of the same glass. Usually, you get better in 1 to 3  weeks.  However, the cough can last for even longer. HOME CARE   Only take medicine as told by your doctor. Follow instructions provided above.  Drink enough water and fluids to keep your pee (urine) clear or pale yellow.  Get plenty of rest.  Return to work when your temperature is < 100 for 24 hours or as told by your doctor. You may use a face mask and wash your hands to stop your cold from spreading. GET HELP RIGHT AWAY  IF:   After the first few days, you feel you are getting worse.  You have questions about your medicine.  You have chills, shortness of breath, or red spit (mucus).  You have pain in the face for more then 1-2 days, especially when you bend forward.  You have a fever, puffy (swollen) neck, pain when you swallow, or white spots in the back of your throat.  You have a bad headache, ear pain, sinus pain, or chest pain.  You have a high-pitched whistling sound when you breathe in and out (wheezing).  You cough up blood.  You have sore muscles or a stiff neck. MAKE SURE YOU:   Understand these instructions.  Will watch your condition.  Will get help right away if you are not doing well or get worse. Document Released: 10/18/2007 Document Revised: 07/24/2011 Document Reviewed: 08/06/2013 [facility] Patient Information 2015 [place], [place]. This information is not intended to replace advice given to you by your health care provider. Make sure you discuss any questions you have with your health care provider.    Kriste Basque R., DO

## 2017-04-10 NOTE — Patient Instructions (Addendum)
INSTRUCTIONS FOR UPPER RESPIRATORY INFECTION:  -plenty of rest and fluids  -nasal saline wash 2-3 times daily (use prepackaged nasal saline or bottled/distilled water if making your own)   -can use AFRIN nasal spray for drainage and nasal congestion - but do NOT use longer then 3-4 days  -can use tylenol (in no history of liver disease) or ibuprofen (if no history of kidney disease, bowel bleeding or significant heart disease) as directed for aches and sorethroat  -in the winter time, using a humidifier at night is helpful (please follow cleaning instructions)  -if you are taking a cough medication - use only as directed, may also try a teaspoon of honey to coat the throat and throat lozenges.  I sent Tessalon to the pharmacy to use as needed.  -Use her albuterol as needed and follow-up promptly if your asthma is worsening or if you have any asthma symptoms that do not respond to the albuterol  -for sore throat, salt water gargles can help  -follow up if you have fevers, facial pain, tooth pain, difficulty breathing or are worsening or symptoms persist longer then expected  Upper Respiratory Infection, Adult An upper respiratory infection (URI) is also known as the common cold. It is often caused by a type of germ (virus). Colds are easily spread (contagious). You can pass it to others by kissing, coughing, sneezing, or drinking out of the same glass. Usually, you get better in 1 to 3  weeks.  However, the cough can last for even longer. HOME CARE   Only take medicine as told by your doctor. Follow instructions provided above.  Drink enough water and fluids to keep your pee (urine) clear or pale yellow.  Get plenty of rest.  Return to work when your temperature is < 100 for 24 hours or as told by your doctor. You may use a face mask and wash your hands to stop your cold from spreading. GET HELP RIGHT AWAY IF:   After the first few days, you feel you are getting worse.  You have  questions about your medicine.  You have chills, shortness of breath, or red spit (mucus).  You have pain in the face for more then 1-2 days, especially when you bend forward.  You have a fever, puffy (swollen) neck, pain when you swallow, or white spots in the back of your throat.  You have a bad headache, ear pain, sinus pain, or chest pain.  You have a high-pitched whistling sound when you breathe in and out (wheezing).  You cough up blood.  You have sore muscles or a stiff neck. MAKE SURE YOU:   Understand these instructions.  Will watch your condition.  Will get help right away if you are not doing well or get worse. Document Released: 10/18/2007 Document Revised: 07/24/2011 Document Reviewed: 08/06/2013 [facility] Patient Information 2015 [place], [place]. This information is not intended to replace advice given to you by your health care provider. Make sure you discuss any questions you have with your health care provider.

## 2017-04-19 ENCOUNTER — Telehealth: Payer: Self-pay | Admitting: Family Medicine

## 2017-04-19 ENCOUNTER — Encounter: Payer: Self-pay | Admitting: Family Medicine

## 2017-04-19 ENCOUNTER — Ambulatory Visit (INDEPENDENT_AMBULATORY_CARE_PROVIDER_SITE_OTHER): Payer: PRIVATE HEALTH INSURANCE | Admitting: Family Medicine

## 2017-04-19 VITALS — BP 92/62 | HR 90 | Temp 99.4°F | Ht 61.25 in

## 2017-04-19 DIAGNOSIS — J989 Respiratory disorder, unspecified: Secondary | ICD-10-CM

## 2017-04-19 DIAGNOSIS — J101 Influenza due to other identified influenza virus with other respiratory manifestations: Secondary | ICD-10-CM

## 2017-04-19 DIAGNOSIS — J4521 Mild intermittent asthma with (acute) exacerbation: Secondary | ICD-10-CM | POA: Diagnosis not present

## 2017-04-19 LAB — POC INFLUENZA A&B (BINAX/QUICKVUE)
Influenza A, POC: POSITIVE — AB
Influenza B, POC: NEGATIVE

## 2017-04-19 MED ORDER — BENZONATATE 100 MG PO CAPS: 100.0000 mg | ORAL_CAPSULE | Freq: Three times a day (TID) | ORAL | 0 refills | Status: DC | PRN

## 2017-04-19 MED ORDER — ALBUTEROL SULFATE HFA 108 (90 BASE) MCG/ACT IN AERS: 2.0000 | INHALATION_SPRAY | Freq: Four times a day (QID) | RESPIRATORY_TRACT | 3 refills | Status: DC | PRN

## 2017-04-19 MED ORDER — PREDNISONE 20 MG PO TABS: 40.0000 mg | ORAL_TABLET | Freq: Every day | ORAL | 0 refills | Status: DC

## 2017-04-19 MED ORDER — OSELTAMIVIR PHOSPHATE 75 MG PO CAPS: 75.0000 mg | ORAL_CAPSULE | Freq: Two times a day (BID) | ORAL | 0 refills | Status: AC

## 2017-04-19 NOTE — Telephone Encounter (Signed)
Copied from CRM [phone]. Topic: Quick Communication - See Telephone Encounter >> Apr 19, 2017 10:19 AM Guinevere FerrariMorris, Mortimer Bair E, NT wrote: CRM for notification. See Telephone encounter for: Pt is calling because she had a question about  benzonatate (TESSALON PERLES) 100 MG capsule. Pt said that medication didn't work for her and wants to see if something else can be called in. Pt uses [facility] [phone] - [place], [place] - [address] [facility] [address] 04/19/17.

## 2017-04-19 NOTE — Addendum Note (Signed)
Addended by: Johnella MoloneyFUNDERBURK, JO A on: 04/19/2017 09:43 AM   Modules accepted: Orders

## 2017-04-19 NOTE — Telephone Encounter (Signed)
Other options would be over-the-counter. I would not recommend anything stronger such as codeine as that can sedate the respiratory system.  I would recommend that she try albuterol, as that may help

## 2017-04-19 NOTE — Telephone Encounter (Signed)
I left a detailed message with the information below at the pts cell number. 

## 2017-04-19 NOTE — Patient Instructions (Addendum)
Shop around for the albuterol - this should be cheaper. You will need your albuterol.  Start the Tamiflu right away.  Start the prednisone.  Sent refill of the tessalon for the cough.  I hope you are feeling better soon! Seek care promptly if your symptoms worsen, new concerns arise or you are not improving with treatment.   Influenza, Adult Influenza ("the flu") is an infection in the lungs, nose, and throat (respiratory tract). It is caused by a virus. The flu causes many common cold symptoms, as well as a high fever and body aches. It can make you feel very sick. The flu spreads easily from person to person (is contagious). Getting a flu shot (influenza vaccination) every year is the best way to prevent the flu. Follow these instructions at home:  Take over-the-counter and prescription medicines only as told by your doctor.  Use a cool mist humidifier to add moisture (humidity) to the air in your home. This can make it easier to breathe.  Rest as needed.  Drink enough fluid to keep your pee (urine) clear or pale yellow.  Cover your mouth and nose when you cough or sneeze.  Wash your hands with soap and water often, especially after you cough or sneeze. If you cannot use soap and water, use hand sanitizer.  Stay home from work or school as told by your doctor. Unless you are visiting your doctor, try to avoid leaving home until your fever has been gone for 24 hours without the use of medicine.  Keep all follow-up visits as told by your doctor. This is important. How is this prevented?  Getting a yearly (annual) flu shot is the best way to avoid getting the flu. You may get the flu shot in late summer, fall, or winter. Ask your doctor when you should get your flu shot.  Wash your hands often or use hand sanitizer often.  Avoid contact with people who are sick during cold and flu season.  Eat healthy foods.  Drink plenty of fluids.  Get enough sleep.  Exercise  regularly. Contact a doctor if:  You get new symptoms.  You have: ? Chest pain. ? Watery poop (diarrhea). ? A fever.  Your cough gets worse.  You start to have more mucus.  You feel sick to your stomach (nauseous).  You throw up (vomit). Get help right away if:  You start to be short of breath or have trouble breathing.  Your skin or nails turn a bluish color.  You have very bad pain or stiffness in your neck.  You get a sudden headache.  You get sudden pain in your face or ear.  You cannot stop throwing up. This information is not intended to replace advice given to you by your health care provider. Make sure you discuss any questions you have with your health care provider. Document Released: 02/08/2008 Document Revised: 10/07/2015 Document Reviewed: 02/23/2015 Elsevier Interactive Patient Education  2017 [employer]

## 2017-04-19 NOTE — Progress Notes (Signed)
HPI:  Acute visit for respiratory illness: -started: 2 days ago -symptoms:nasal congestion, sore throat, cough, body aches, vomited 2 days ago, feels wheezy at times, low grade temp -denies:fever, sinus pain, rash, sig sob -has tried: nothing - she did not fill her albuterol as it was $95 -sick contacts/travel/risks: no reported flu, strep or tick exposure -Hx of: intermittent asthma  ROS: See pertinent positives and negatives per HPI.  Past Medical History:  Diagnosis Date  . Abnormal pap    w LEEP  . Alcoholism (HCC)   . Allergy   . Asthma   . Blood in stool   . Depression (emotion)   . Frequent headaches   . Hepatitis C, chronic (HCC)    hep c tx successful  . Irritable bowel syndrome   . OSA on CPAP - sees Dr. Jenne Campus in ENT 10/09/2013  . Seasonal allergies    scratch test in 2006///mold issue in house    Past Surgical History:  Procedure Laterality Date  . DILATION AND CURETTAGE OF UTERUS  1996, 2001, 2002   x 3  . LEEP  2008  . NASAL FRACTURE SURGERY  1999   repair  . NASAL SEPTOPLASTY W/ TURBINOPLASTY      Family History  Problem Relation Age of Onset  . Emphysema Mother   . Heart disease Father   . Cancer Father        unsure??  . Stroke Maternal Grandmother   . Hypertension Father   . Osteoporosis Maternal Grandmother   . Hepatitis C Father   . Colon polyps Mother   . Diverticulitis Mother     Social History   Socioeconomic History  . Marital status: Single    Spouse name: None  . Number of children: 0  . Years of education: None  . Highest education level: None  Social Needs  . Financial resource strain: None  . Food insecurity - worry: None  . Food insecurity - inability: None  . Transportation needs - medical: None  . Transportation needs - non-medical: None  Occupational History  . Occupation: Adult nurse: [employer]  Tobacco Use  . Smoking status: Former Smoker    Packs/day: 1.50    Years: 15.00      Pack years: 22.50    Types: Cigarettes    Last attempt to quit: 05/15/2005    Years since quitting: 11.9  . Smokeless tobacco: Never Used  . Tobacco comment: quit in 2007, smoked for 13 years   Substance and Sexual Activity  . Alcohol use: No    Alcohol/week: 0.0 oz    Comment: quit drinking on 2004---relapse in 2007---QUIT 07/2005  . Drug use: No    Comment: IV drug use///street drugs///inhalants  QUIT 07/2005  . Sexual activity: None  Other Topics Concern  . None  Social History Narrative   Work or School: Glass blower/designer Situation: lives with husband      Spiritual Beliefs: none      Lifestyle: no regular exercise; diet is fair                 Current Outpatient Medications:  .  ALTAVERA 0.15-30 MG-MCG tablet, TAKE 1 TABLET BY MOUTH DAILY, Disp: 84 tablet, Rfl: 2 .  ARIPiprazole (ABILIFY) 15 MG tablet, Take 15 mg by mouth daily.  , Disp: , Rfl:  .  cetirizine (ZYRTEC) 10 MG tablet, Take 10 mg by mouth daily., Disp: ,  Rfl:  .  desvenlafaxine (PRISTIQ) 50 MG 24 hr tablet, TK 1 T PO QAM, Disp: , Rfl: 11 .  dicyclomine (BENTYL) 10 MG capsule, Take 1 capsule (10 mg total) by mouth every 8 (eight) hours as needed., Disp: 60 capsule, Rfl: 3 .  Multiple Vitamin (MULTIVITAMINS PO), Take by mouth., Disp: , Rfl:  .  ondansetron (ZOFRAN ODT) 8 MG disintegrating tablet, Take 1 tablet (8 mg total) by mouth every 8 (eight) hours as needed for nausea or vomiting., Disp: 15 tablet, Rfl: 0 .  SUMAtriptan (IMITREX) 100 MG tablet, May repeat in 2 hours if headache persists or recurs. No more than 2 in 24 hours., Disp: 10 tablet, Rfl: 1 .  triamcinolone cream (KENALOG) 0.1 %, Apply 1 application topically 2 (two) times daily., Disp: 30 g, Rfl: 0 .  albuterol (PROAIR HFA) 108 (90 Base) MCG/ACT inhaler, Inhale 2 puffs into the lungs every 6 (six) hours as needed for wheezing or shortness of breath., Disp: 1 Inhaler, Rfl: 3 .  benzonatate (TESSALON PERLES) 100 MG capsule, Take 1  capsule (100 mg total) by mouth 3 (three) times daily as needed., Disp: 20 capsule, Rfl: 0 .  oseltamivir (TAMIFLU) 75 MG capsule, Take 1 capsule (75 mg total) by mouth 2 (two) times daily for 5 days., Disp: 10 capsule, Rfl: 0 .  predniSONE (DELTASONE) 20 MG tablet, Take 2 tablets (40 mg total) by mouth daily with breakfast., Disp: 8 tablet, Rfl: 0  EXAM:  Vitals:   04/19/17 0914  BP: 92/62  Pulse: 90  Temp: 99.4 F (37.4 C)  SpO2: 97%    Body mass index is 38.31 kg/m.  GENERAL: vitals reviewed and listed above, alert, oriented, appears well hydrated and in no acute distress  HEENT: atraumatic, conjunttiva clear, no obvious abnormalities on inspection of external nose and ears, normal appearance of ear canals and TMs, clear nasal congestion, mild post oropharyngeal erythema with PND, no tonsillar edema or exudate, no sinus TTP  NECK: no obvious masses on inspection  LUNGS: clear to auscultation bilaterally, no wheezes, rales or rhonchi, good air movement  CV: HRRR, no peripheral edema  MS: moves all extremities without noticeable abnormality  PSYCH: pleasant and cooperative, no obvious depression or anxiety  ASSESSMENT AND PLAN:  Discussed the following assessment and plan:  Influenza A  Respiratory illness  Intermittent asthma with acute exacerbation, unspecified asthma severity  -given HPI and exam findings today, a serious infection or illness is unlikely. We discussed potential etiologies, with VURI vs influenza w/ asthma exacerbation being most likely.  We discussed risks/benefits/indications/best timing for tamiflu, symptomatic care, likely course, transmission, potential complications, signs of developing a serious illness and return and emergency precuations. Flu test + for influenza A. Rx for tamiflu, prednisone, tessalon and albuterol. -of course, we advised to return or notify a doctor immediately if symptoms worsen or persist or new concerns  arise.    Patient Instructions  Shop around for the albuterol - this should be cheaper. You will need your albuterol.  Start the Tamiflu right away.  Start the prednisone.  Sent refill of the tessalon for the cough.  I hope you are feeling better soon! Seek care promptly if your symptoms worsen, new concerns arise or you are not improving with treatment.   Influenza, Adult Influenza ("the flu") is an infection in the lungs, nose, and throat (respiratory tract). It is caused by a virus. The flu causes many common cold symptoms, as well as a high fever and  body aches. It can make you feel very sick. The flu spreads easily from person to person (is contagious). Getting a flu shot (influenza vaccination) every year is the best way to prevent the flu. Follow these instructions at home:  Take over-the-counter and prescription medicines only as told by your doctor.  Use a cool mist humidifier to add moisture (humidity) to the air in your home. This can make it easier to breathe.  Rest as needed.  Drink enough fluid to keep your pee (urine) clear or pale yellow.  Cover your mouth and nose when you cough or sneeze.  Wash your hands with soap and water often, especially after you cough or sneeze. If you cannot use soap and water, use hand sanitizer.  Stay home from work or school as told by your doctor. Unless you are visiting your doctor, try to avoid leaving home until your fever has been gone for 24 hours without the use of medicine.  Keep all follow-up visits as told by your doctor. This is important. How is this prevented?  Getting a yearly (annual) flu shot is the best way to avoid getting the flu. You may get the flu shot in late summer, fall, or winter. Ask your doctor when you should get your flu shot.  Wash your hands often or use hand sanitizer often.  Avoid contact with people who are sick during cold and flu season.  Eat healthy foods.  Drink plenty of fluids.  Get  enough sleep.  Exercise regularly. Contact a doctor if:  You get new symptoms.  You have: ? Chest pain. ? Watery poop (diarrhea). ? A fever.  Your cough gets worse.  You start to have more mucus.  You feel sick to your stomach (nauseous).  You throw up (vomit). Get help right away if:  You start to be short of breath or have trouble breathing.  Your skin or nails turn a bluish color.  You have very bad pain or stiffness in your neck.  You get a sudden headache.  You get sudden pain in your face or ear.  You cannot stop throwing up. This information is not intended to replace advice given to you by your health care provider. Make sure you discuss any questions you have with your health care provider. Document Released: 02/08/2008 Document Revised: 10/07/2015 Document Reviewed: 02/23/2015 Elsevier Interactive Patient Education  [address]2017 Elsevier Inc.    St. MichaelKIM, Dahlia ClientHANNAH R., DO

## 2017-04-19 NOTE — Telephone Encounter (Signed)
Duplicate note. See other note dated for today.

## 2017-04-19 NOTE — Telephone Encounter (Signed)
Spoke with Eber Jonesarolyn at [facility] regarding pt request for something for cough; pt stated that tessalon has not worked; Eber JonesCarolyn will route this request to the provider; pt notified of this and verbalizes understanding. Her pharmacy is [facility] on [address].

## 2017-05-24 ENCOUNTER — Encounter: Payer: Self-pay | Admitting: Family Medicine

## 2017-05-25 ENCOUNTER — Telehealth: Payer: Self-pay | Admitting: Gastroenterology

## 2017-05-25 NOTE — Telephone Encounter (Signed)
Patient having more issues with IBS-D, takes imodium prn. Scheduled to follow up with Dr. Adela LankArmbruster on 1/14.

## 2017-05-28 ENCOUNTER — Encounter: Payer: Self-pay | Admitting: Gastroenterology

## 2017-05-28 ENCOUNTER — Ambulatory Visit: Payer: PRIVATE HEALTH INSURANCE | Admitting: Gastroenterology

## 2017-05-28 ENCOUNTER — Telehealth: Payer: Self-pay | Admitting: Family Medicine

## 2017-05-28 VITALS — BP 112/76 | HR 74 | Ht 60.0 in | Wt 201.1 lb

## 2017-05-28 DIAGNOSIS — R1013 Epigastric pain: Secondary | ICD-10-CM | POA: Diagnosis not present

## 2017-05-28 DIAGNOSIS — K580 Irritable bowel syndrome with diarrhea: Secondary | ICD-10-CM

## 2017-05-28 MED ORDER — ONDANSETRON 8 MG PO TBDP: 8.0000 mg | ORAL_TABLET | Freq: Three times a day (TID) | ORAL | 3 refills | Status: DC | PRN

## 2017-05-28 MED ORDER — DICYCLOMINE HCL 10 MG PO CAPS: 10.0000 mg | ORAL_CAPSULE | Freq: Three times a day (TID) | ORAL | 1 refills | Status: DC | PRN

## 2017-05-28 MED ORDER — AMBULATORY NON FORMULARY MEDICATION: 0 refills | Status: DC

## 2017-05-28 MED ORDER — LEVONORGESTREL-ETHINYL ESTRAD 0.15-30 MG-MCG PO TABS: 1.0000 | ORAL_TABLET | Freq: Every day | ORAL | 0 refills | Status: DC

## 2017-05-28 NOTE — Telephone Encounter (Signed)
Refill request for Altavera, pt has rescheduled upcoming appointment.

## 2017-05-28 NOTE — Patient Instructions (Addendum)
If you are age 40 or older, your body mass index should be between 23-30. Your Body mass index is 39.28 kg/m. If this is out of the aforementioned range listed, please consider follow up with your Primary Care Provider.  If you are age 40 or younger, your body mass index should be between 19-25. Your Body mass index is 39.28 kg/m. If this is out of the aformentioned range listed, please consider follow up with your Primary Care Provider.   We have given you samples of the following medication to take: VSL#3 112.5 billion:  Take 1 tablet twice a day.  If this helps, you can purchase more over the counter.  [facility] on [address] tends to have the lowest price..  We have sent the following medications to your pharmacy for you to pick up at your convenience: Zofran Bentyl  You can try FDgard (over the counter) and Imodium (over the counter) as needed.  Thank you for entrusting me with your care and for [facility], Dr. Ileene PatrickSteven Armbruster

## 2017-05-28 NOTE — Telephone Encounter (Signed)
Rx done. 

## 2017-05-28 NOTE — Progress Notes (Signed)
HPI :  42 year old female here for follow-up visit for IBS D and dyspepsia. She has a history of hepatitis C remotely treated in 2012, no known cirrhosis. She has had a prior evaluation for her symptoms with inflammatory markers, celiac serology, and stool studies which has been unremarkable, Colonoscopy in 2011 for rectal bleeding showed normal colon with internal and external hemorrhoids. EGD 02/2007 was normal. H pylori testing has been negative. [place] negative for gallstones  At the last visit I gave her a course of rifaximin for 2 weeks. She states she responded very favorably to this and she thought it helped her significantly, with benefit lasting for several months. Over the past year she's generally been feeling okay. She has been using probiotics on a routine basis - she states she feels mostly better when she takes them, however the current form she is taking now is not providing much benefit overall. She is having one to 2 bowel once per day, ranging from loose to formed stool. Some days are worse than others. She continues to have intermittent abdominal cramping and bloating. Some foods can trigger her symptoms, sometimes they occur sporadically. She is not seeing any blood in her stool. She is avoiding lactose in some dairy products which she thinks helps. She did not have benefit with a low FODMAP diet and stopped it.  She is having occasional vomiting, and rare postprandial epigastric discomfort. She reports Zofran works really well for her when she takes it, and FD Delene Ruffini did help as well. She is using Bentyl as needed for abdominal cramps which helps as well as Imodium as needed on a bad day for her diarrhea. She did try IB Gard once and thought the flavor was too minty and did not like the way it tasted.    Past Medical History:  Diagnosis Date  . Abnormal pap    w LEEP  . Alcoholism (HCC)   . Allergy   . Asthma   . Blood in stool   . Depression (emotion)   . Frequent headaches     . Hepatitis C, chronic (HCC)    hep c tx successful  . Irritable bowel syndrome   . OSA on CPAP - sees Dr. Jenne Campus in ENT 10/09/2013  . Seasonal allergies    scratch test in 2006///mold issue in house     Past Surgical History:  Procedure Laterality Date  . DILATION AND CURETTAGE OF UTERUS  1996, 2001, 2002   x 3  . LEEP  2008  . NASAL FRACTURE SURGERY  1999   repair  . NASAL SEPTOPLASTY W/ TURBINOPLASTY     Family History  Problem Relation Age of Onset  . Emphysema Mother   . Colon polyps Mother   . Diverticulitis Mother   . Heart disease Father   . Cancer Father        unsure??  . Hypertension Father   . Hepatitis C Father   . Stroke Maternal Grandmother   . Osteoporosis Maternal Grandmother    Social History   Tobacco Use  . Smoking status: Former Smoker    Packs/day: 1.50    Years: 15.00    Pack years: 22.50    Types: Cigarettes    Last attempt to quit: 05/15/2005    Years since quitting: 12.0  . Smokeless tobacco: Never Used  . Tobacco comment: quit in 2007, smoked for 13 years   Substance Use Topics  . Alcohol use: No    Alcohol/week: 0.0 oz  Comment: quit drinking on 2004---relapse in 2007---QUIT 07/2005  . Drug use: No    Comment: IV drug use///street drugs///inhalants  QUIT 07/2005   Current Outpatient Medications  Medication Sig Dispense Refill  . albuterol (PROAIR HFA) 108 (90 Base) MCG/ACT inhaler Inhale 2 puffs into the lungs every 6 (six) hours as needed for wheezing or shortness of breath. 1 Inhaler 3  . ARIPiprazole (ABILIFY) 15 MG tablet Take 15 mg by mouth daily.      . cetirizine (ZYRTEC) 10 MG tablet Take 10 mg by mouth daily.    Marland Kitchen. desvenlafaxine (PRISTIQ) 50 MG 24 hr tablet TK 1 T PO QAM  11  . dicyclomine (BENTYL) 10 MG capsule Take 1 capsule (10 mg total) by mouth every 8 (eight) hours as needed. 60 capsule 3  . levonorgestrel-ethinyl estradiol (ALTAVERA) 0.15-30 MG-MCG tablet Take 1 tablet by mouth daily. 84 tablet 0  . Multiple Vitamin  (MULTIVITAMINS PO) Take by mouth.    . ondansetron (ZOFRAN ODT) 8 MG disintegrating tablet Take 1 tablet (8 mg total) by mouth every 8 (eight) hours as needed for nausea or vomiting. 15 tablet 0  . Probiotic Product (PROBIOTIC ADVANCED PO) Take by mouth daily.    . SUMAtriptan (IMITREX) 100 MG tablet May repeat in 2 hours if headache persists or recurs. No more than 2 in 24 hours. 10 tablet 1   No current facility-administered medications for this visit.    Allergies  Allergen Reactions  . Other Anaphylaxis    Nuts-Brazil and cashews  . Cefaclor Rash  . Nickel Rash     Review of Systems: All systems reviewed and negative except where noted in HPI.   Lab Results  Component Value Date   WBC 5.9 10/26/2014   HGB 15.5 (H) 10/26/2014   HCT 45.5 10/26/2014   MCV 91.4 10/26/2014   PLT 292.0 10/26/2014    Lab Results  Component Value Date   CREATININE 1.05 10/26/2014   BUN 9 10/26/2014   NA 138 10/26/2014   K 3.9 10/26/2014   CL 106 10/26/2014   CO2 27 10/26/2014    Lab Results  Component Value Date   ALT 25 12/01/2015   AST 18 12/01/2015   ALKPHOS 34 (L) 12/01/2015   BILITOT 0.4 12/01/2015     Physical Exam: BP 112/76   Pulse 74   Ht 5' (1.524 m)   Wt 201 lb 2 oz (91.2 kg)   BMI 39.28 kg/m  Constitutional: Pleasant,well-developed, female in no acute distress. HEENT: Normocephalic and atraumatic. Conjunctivae are normal. No scleral icterus. Neck supple.  Cardiovascular: Normal rate, regular rhythm.  Pulmonary/chest: Effort normal and breath sounds normal. No wheezing, rales or rhonchi. Abdominal: Soft, nondistended, mild RLQ TTP without rebound or guarding. There are no masses palpable. No hepatomegaly. Extremities: no edema Lymphadenopathy: No cervical adenopathy noted. Neurological: Alert and oriented to person place and time. Skin: Skin is warm and dry. No rashes noted. Psychiatric: Normal mood and affect. Behavior is normal.   ASSESSMENT AND  PLAN: 42 year old female here for follow-up visit for IBS D and dyspepsia. Generally doing pretty well since her last seen her. She did respond quite favorably to rifaximin although this was expensive for her. Her symptoms are stable, she's had extensive workup over the past with no pathology noted. Recommend the following at this time:  IBS-D - will try her on VSL#3, 1 capsule BID. I was able to provide her some samples of this. If it works better than her current  probiotic, she can purchase this OTC. Otherwise, continue bentyl as needed and immodium PRN. She did not like IB gard, will hold off on this. We discussed Viberzi, but I think this may be too strong for her, her diarrhea is not persistent. She did well with Rifaximin in the past, can consider this moving forward if needed, but will hold off right now due to cost.   Dyspepsia - Zofran works well, will refill this. Otherwise can continue FD gard PRN.which has helped.   She can follow up as needed for these issues.  Ileene Patrick, MD [facility] Pager [phone]

## 2017-05-29 ENCOUNTER — Encounter: Payer: PRIVATE HEALTH INSURANCE | Admitting: Family Medicine

## 2017-06-18 NOTE — Progress Notes (Signed)
HPI:  Here for CPE:  -Concerns and/or follow up today:  Nicole [name] is a pleasant 42 y.o. here for follow up. Chronic medical problems summarized below were reviewed for changes and stability and were updated as needed below. These issues and their treatment remain stable for the most part. She is requesting a refill of OCP. Also is requesting referral to pulm in [place] for recheck on her OSA. Reports eval with Dr. Tami [name] in [place] in 2014 with sinus surgery and on CPAP. However, feels over the years cpap not as effective and feels tired when wakes up. Recently started eating healthier - tough because her blind husband does most of the cooking and is tough for him to follow new recipes. Try to get some exercise. Denies CP, SOB, DOE, treatment intolerance or new symptoms. Due for ct lungs  Asthma/Allergies: -alb prn, zyrtec  OSA/Obesity/HLD: -sees ENT for sleep apnea - Dr. Tami [name] in [place] in 2014 -requested referral to [facility] in 2019 -s/p sinus surgery -uses CPAP  Pulm nodule: -advised repeat CT 1-2 years per radiology recs 2017 -pt did not do, re-ordered in 2019  IBS/hx treated hep c: -sees GI -takes bentyl and probiotic  Depression: -sees Dr. Toy [name] for management -meds: abilify, pristiq   -Diet: variety of foods, balance and well rounded, larger portion sizes -Exercise: starting to exercise a little -Taking folic acid, vitamin D or calcium: no -Diabetes and Dyslipidemia Screening: fasting for labs -Vaccines: see vaccine section EPIC -pap history: normal and hpv neg 05/2016 -FDLMP: see nursing notes -sexual activity: yes, female partner, no new partners -wants STI testing (Hep C if born 41-65): no -FH breast, colon or ovarian ca: see FH Last mammogram: n/a Last colon cancer screening: n/a Breast Ca Risk Assessment: see family history and pt history DEXA (>/= 65): n/a  -Alcohol, Tobacco, drug use: see social history  Review of Systems - no fevers,  unintentional weight loss, vision loss, hearing loss, chest pain, sob, hemoptysis, melena, hematochezia, hematuria, genital discharge, changing or concerning skin lesions, bleeding, bruising, loc, thoughts of self harm or SI  Past Medical History:  Diagnosis Date  . Abnormal pap    w LEEP  . Alcoholism ([place])   . Allergy   . Asthma   . Blood in stool   . Depression (emotion)   . Frequent headaches   . Hepatitis C, chronic (HCC)    hep c tx successful  . Irritable bowel syndrome   . OSA on CPAP - sees Dr. Tami [name] in [place] 10/09/2013  . Seasonal allergies    scratch test in 2006///mold issue in house    Past Surgical History:  Procedure Laterality Date  . [place], 2001, 2002   x 3  . LEEP  2008  . NASAL FRACTURE SURGERY  1999   repair  . NASAL SEPTOPLASTY W/ TURBINOPLASTY      Family History  Problem Relation Age of Onset  . Emphysema Mother   . Colon polyps Mother   . Diverticulitis Mother   . Heart disease Father   . Cancer Father        unsure??  . Hypertension Father   . Hepatitis C Father   . Stroke Maternal Grandmother   . Osteoporosis Maternal Grandmother     Social History   Socioeconomic History  . Marital status: Married    Spouse name: None  . Number of children: 0  . Years of education: None  . Highest education  level: None  Social Needs  . Financial resource strain: None  . Food insecurity - worry: None  . Food insecurity - inability: None  . Transportation needs - medical: None  . Transportation needs - non-medical: None  Occupational History  . Occupation: Psychiatrist: [employer]  Tobacco Use  . Smoking status: Former Smoker    Packs/day: 1.50    Years: 15.00    Pack years: 22.50    Types: Cigarettes    Last attempt to quit: 05/15/2005    Years since quitting: 12.1  . Smokeless tobacco: Never Used  . Tobacco comment: quit in 2007, smoked for 13 years   Substance and Sexual  Activity  . Alcohol use: No    Alcohol/week: 0.0 oz    Comment: quit drinking on 2004---relapse in 2007---QUIT 07/2005  . Drug use: No    Comment: IV drug use///street drugs///inhalants  QUIT 07/2005  . Sexual activity: None  Other Topics Concern  . None  Social History Narrative   Work or School: Systems analyst Situation: lives with husband      Spiritual Beliefs: none      Lifestyle: no regular exercise; diet is fair                 Current Outpatient Medications:  .  albuterol (PROAIR HFA) 108 (90 Base) MCG/ACT inhaler, Inhale 2 puffs into the lungs every 6 (six) hours as needed for wheezing or shortness of breath., Disp: 1 Inhaler, Rfl: 3 .  AMBULATORY NON FORMULARY MEDICATION, Medication Name: VSL#3 112.5 billion: Take 1 capsule twice a day, Disp: 20 capsule, Rfl: 0 .  ARIPiprazole (ABILIFY) 15 MG tablet, Take 15 mg by mouth daily.  , Disp: , Rfl:  .  cetirizine (ZYRTEC) 10 MG tablet, Take 10 mg by mouth daily., Disp: , Rfl:  .  desvenlafaxine (PRISTIQ) 50 MG 24 hr tablet, TK 1 T PO QAM, Disp: , Rfl: 11 .  dicyclomine (BENTYL) 10 MG capsule, Take 1 capsule (10 mg total) by mouth every 8 (eight) hours as needed., Disp: 90 capsule, Rfl: 1 .  levonorgestrel-ethinyl estradiol (ALTAVERA) 0.15-30 MG-MCG tablet, Take 1 tablet by mouth daily., Disp: 84 tablet, Rfl: 0 .  Multiple Vitamin (MULTIVITAMINS PO), Take by mouth., Disp: , Rfl:  .  ondansetron (ZOFRAN ODT) 8 MG disintegrating tablet, Take 1 tablet (8 mg total) by mouth every 8 (eight) hours as needed for nausea or vomiting., Disp: 30 tablet, Rfl: 3 .  Probiotic Product (PROBIOTIC ADVANCED PO), Take by mouth daily., Disp: , Rfl:  .  SUMAtriptan (IMITREX) 100 MG tablet, May repeat in 2 hours if headache persists or recurs. No more than 2 in 24 hours., Disp: 10 tablet, Rfl: 1  EXAM:  Vitals:   06/19/17 0754  BP: 100/78  Pulse: 87  Temp: 97.6 F (36.4 C)  Body mass index is 39.94 kg/m.   GENERAL: vitals  reviewed and listed below, alert, oriented, appears well hydrated and in no acute distress  HEENT: head atraumatic, PERRLA, normal appearance of eyes, ears, nose and mouth. moist mucus membranes.  NECK: supple, no masses or lymphadenopathy  LUNGS: clear to auscultation bilaterally, no rales, rhonchi or wheeze  CV: HRRR, no peripheral edema or cyanosis, normal pedal pulses  ABDOMEN: bowel sounds normal, soft, non tender to palpation, no masses, no rebound or guarding  GU/BREAST: breast exam normal, pelvic declined as pap not due  SKIN: no rash  or abnormal lesions  MS: normal gait, moves all extremities normally  NEURO: normal gait, speech and thought processing grossly intact, muscle tone grossly intact throughout  PSYCH: normal affect, pleasant and cooperative  ASSESSMENT AND PLAN:  Discussed the following assessment and plan:  PREVENTIVE EXAM: -Discussed and advised all [place] preventive services health task force level A and B recommendations for age, sex and risks. -Advised at least 150 minutes of exercise per week and a healthy diet with avoidance of (less then 1 serving per week) processed foods, white starches, red meat, fast foods and sweets and consisting of: * 5-9 servings of fresh fruits and vegetables (not corn or potatoes) *nuts and seeds, beans *olives and olive oil *lean meats such as fish and white chicken  *whole grains -labs, studies and vaccines per orders this encounter  2. OSA on CPAP - sees Dr. Tami [name] in [place] -on CPAP, last eval 2014 per her report and feels CPAP not working as well - Ambulatory referral to Pulmonology  3. Pulmonary nodules - CT Chest W Contrast; Future advised again -advise 1-2 year repeat in the past per radiology recs, pt did not complete  4. Cough variant asthma -stable  5. Hyperlipidemia, unspecified hyperlipidemia type -lifestyle recs - Lipid panel  6. BMI 39.0-39.9,adult -spent a good deal of time discussing a healthy diet -  she wants a formal diet or plan and suggested the Mediterranean diet and regular aerobic activity -congratulated her on desire to change and of steps she has made recently -advised wt reduction - Hemoglobin A1c  7. Recurrent major depressive disorder, in full remission ([place]) -see PHQ9 -stable and well controlled -sees psychiatry for management   Patient advised to return to clinic immediately if symptoms worsen or persist or new concerns.  Patient Instructions  BEFORE YOU LEAVE: -labs -follow up: 6 months   We have ordered labs or studies at this visit. It can take up to 1-2 weeks for results and processing. IF results require follow up or explanation, we will call you with instructions. Clinically stable results will be released to your [facility]. If you have not heard from [place] or cannot find your results in [facility] in 2 weeks please contact our office at [phone].  If you are not yet signed up for [facility], please consider signing up.  -We placed a referral for you as discussed regarding the sleep apnea. It usually takes about 1-2 weeks to process and schedule this referral. If you have not heard from [place] regarding this appointment in 2 weeks please contact our office.   We recommend the following healthy lifestyle for LIFE: 1) Small portions. But, make sure to get regular (at least 3 per day), healthy meals and small healthy snacks if needed.  2) Eat a healthy clean diet.   TRY TO EAT: -at least 5-7 servings of low sugar, colorful, and nutrient rich vegetables per day (not corn, potatoes or bananas.) -berries are the best choice if you wish to eat fruit (only eat small amounts if trying to reduce weight)  -lean meets (fish, white meat of chicken or [place]) -vegan proteins for some meals - beans or tofu, whole grains, nuts and seeds -Replace bad fats with good fats - good fats include: fish, nuts and seeds, canola oil, olive oil -small amounts of low fat or non fat dairy -small  amounts of100 % whole grains - check the lables -drink plenty of water  AVOID: -SUGAR, sweets, anything with added sugar, corn syrup or sweeteners -  must read labels as even foods advertised as "healthy" often are loaded with sugar -if you must have a sweetener, small amounts of stevia may be best -sweetened beverages and artificially sweetened beverages -simple starches (rice, bread, potatoes, pasta, chips, etc - small amounts of 100% whole grains are ok) -red meat, pork, butter -fried foods, fast food, processed food, excessive dairy, eggs and coconut.  3)Get at least 150 minutes of sweaty aerobic exercise per week.  4)Reduce stress - consider counseling, meditation and relaxation to balance other aspects of your life.    Preventive [name] 18-39 Years, Female Preventive [name] refers to lifestyle choices and visits with your health [name] provider that can promote health and wellness. What does preventive [name] include?  A yearly physical exam. This is also called an annual well check.  Dental exams once or twice a year.  Routine eye exams. Ask your health [name] provider how often you should have your eyes checked.  Personal lifestyle choices, including: ? Daily [name] of your teeth and gums. ? Regular physical activity. ? Eating a healthy diet. ? Avoiding tobacco and drug use. ? Limiting alcohol use. ? Practicing safe sex. ? Taking vitamin and mineral supplements as recommended by your health [name] provider. What happens during an annual well check? The services and screenings done by your health [name] provider during your annual well check will depend on your age, overall health, lifestyle risk factors, and family history of disease. Counseling Your health [name] provider may ask you questions about your:  Alcohol use.  Tobacco use.  Drug use.  Emotional well-being.  Home and relationship well-being.  Sexual activity.  Eating habits.  Work and work Statistician.  Method  of birth control.  Menstrual cycle.  Pregnancy history.  Screening You may have the following tests or measurements:  Height, weight, and BMI.  Diabetes screening. This is done by checking your blood sugar (glucose) after you have not eaten for a while (fasting).  Blood pressure.  Lipid and cholesterol levels. These may be checked every 5 years starting at age 52.  Skin check.  Hepatitis C blood test.  Hepatitis B blood test.  Sexually transmitted disease (STD) testing.  BRCA-related cancer screening. This may be done if you have a family history of breast, ovarian, tubal, or peritoneal cancers.  Pelvic exam and Pap test. This may be done every 3 years starting at age 25. Starting at age 13, this may be done every 5 years if you have a Pap test in combination with an HPV test.  Discuss your test results, treatment options, and if necessary, the need for more tests with your health [name] provider. Vaccines Your health [name] provider may recommend certain vaccines, such as:  Influenza vaccine. This is recommended every year.  Tetanus, diphtheria, and acellular pertussis (Tdap, Td) vaccine. You may need a Td booster every 10 years.  Varicella vaccine. You may need this if you have not been vaccinated.  HPV vaccine. If you are 33 or younger, you may need three doses over 6 months.  Measles, mumps, and rubella (MMR) vaccine. You may need at least one dose of MMR. You may also need a second dose.  Pneumococcal 13-valent conjugate (PCV13) vaccine. You may need this if you have certain conditions and were not previously vaccinated.  Pneumococcal polysaccharide (PPSV23) vaccine. You may need one or two doses if you smoke cigarettes or if you have certain conditions.  Meningococcal vaccine. One dose is recommended if you are age 71-21 years  and a Market researcher living in a residence hall, or if you have one of several medical conditions. You may also need additional  booster doses.  Hepatitis A vaccine. You may need this if you have certain conditions or if you travel or work in places where you may be exposed to hepatitis A.  Hepatitis B vaccine. You may need this if you have certain conditions or if you travel or work in places where you may be exposed to hepatitis B.  Haemophilus influenzae type b (Hib) vaccine. You may need this if you have certain risk factors.  Talk to your health [name] provider about which screenings and vaccines you need and how often you need them. This information is not intended to replace advice given to you by your health [name] provider. Make sure you discuss any questions you have with your health [name] provider. Document Released: 06/27/2001 Document Revised: 01/19/2016 Document Reviewed: 03/02/2015 Elsevier Interactive Patient Education  2018 [employer].       No Follow-up on file.  Lucretia Kern, DO

## 2017-06-19 ENCOUNTER — Telehealth: Payer: Self-pay | Admitting: *Deleted

## 2017-06-19 ENCOUNTER — Ambulatory Visit (INDEPENDENT_AMBULATORY_CARE_PROVIDER_SITE_OTHER): Payer: PRIVATE HEALTH INSURANCE | Admitting: Family Medicine

## 2017-06-19 ENCOUNTER — Encounter: Payer: Self-pay | Admitting: Family Medicine

## 2017-06-19 VITALS — BP 100/78 | HR 87 | Temp 97.6°F | Ht 60.0 in | Wt 204.5 lb

## 2017-06-19 DIAGNOSIS — Z6839 Body mass index (BMI) 39.0-39.9, adult: Secondary | ICD-10-CM

## 2017-06-19 DIAGNOSIS — B182 Chronic viral hepatitis C: Secondary | ICD-10-CM | POA: Diagnosis not present

## 2017-06-19 DIAGNOSIS — Z0000 Encounter for general adult medical examination without abnormal findings: Secondary | ICD-10-CM | POA: Diagnosis not present

## 2017-06-19 DIAGNOSIS — J45991 Cough variant asthma: Secondary | ICD-10-CM

## 2017-06-19 DIAGNOSIS — R911 Solitary pulmonary nodule: Secondary | ICD-10-CM | POA: Diagnosis not present

## 2017-06-19 DIAGNOSIS — G4733 Obstructive sleep apnea (adult) (pediatric): Secondary | ICD-10-CM | POA: Diagnosis not present

## 2017-06-19 DIAGNOSIS — E785 Hyperlipidemia, unspecified: Secondary | ICD-10-CM

## 2017-06-19 DIAGNOSIS — Z9989 Dependence on other enabling machines and devices: Secondary | ICD-10-CM

## 2017-06-19 DIAGNOSIS — F3342 Major depressive disorder, recurrent, in full remission: Secondary | ICD-10-CM

## 2017-06-19 LAB — LIPID PANEL
CHOL/HDL RATIO: 3
Cholesterol: 183 mg/dL (ref 0–200)
HDL: 53.2 mg/dL (ref >39.00)
LDL Cholesterol: 96 mg/dL (ref 0–99)
NONHDL: 129.74
Triglycerides: 171 mg/dL — ABNORMAL HIGH (ref 0.0–149.0)
VLDL: 34.2 mg/dL (ref 0.0–40.0)

## 2017-06-19 LAB — HEMOGLOBIN A1C: Hgb A1c MFr Bld: 5.5 % (ref 4.6–6.5)

## 2017-06-19 NOTE — Patient Instructions (Addendum)
BEFORE YOU LEAVE: -labs -follow up: 6 months   We have ordered labs or studies at this visit. It can take up to 1-2 weeks for results and processing. IF results require follow up or explanation, we will call you with instructions. Clinically stable results will be released to your [facility]. If you have not heard from [place] or cannot find your results in [facility] in 2 weeks please contact our office at [phone].  If you are not yet signed up for [facility], please consider signing up.  -We placed a referral for you as discussed regarding the sleep apnea. It usually takes about 1-2 weeks to process and schedule this referral. If you have not heard from [place] regarding this appointment in 2 weeks please contact our office.   We recommend the following healthy lifestyle for LIFE: 1) Small portions. But, make sure to get regular (at least 3 per day), healthy meals and small healthy snacks if needed.  2) Eat a healthy clean diet.   TRY TO EAT: -at least 5-7 servings of low sugar, colorful, and nutrient rich vegetables per day (not corn, potatoes or bananas.) -berries are the best choice if you wish to eat fruit (only eat small amounts if trying to reduce weight)  -lean meets (fish, white meat of chicken or [place]) -vegan proteins for some meals - beans or tofu, whole grains, nuts and seeds -Replace bad fats with good fats - good fats include: fish, nuts and seeds, canola oil, olive oil -small amounts of low fat or non fat dairy -small amounts of100 % whole grains - check the lables -drink plenty of water  AVOID: -SUGAR, sweets, anything with added sugar, corn syrup or sweeteners - must read labels as even foods advertised as "healthy" often are loaded with sugar -if you must have a sweetener, small amounts of stevia may be best -sweetened beverages and artificially sweetened beverages -simple starches (rice, bread, potatoes, pasta, chips, etc - small amounts of 100% whole grains are ok) -red meat,  pork, butter -fried foods, fast food, processed food, excessive dairy, eggs and coconut.  3)Get at least 150 minutes of sweaty aerobic exercise per week.  4)Reduce stress - consider counseling, meditation and relaxation to balance other aspects of your life.    Preventive Care 18-39 Years, Female Preventive care refers to lifestyle choices and visits with your health care provider that can promote health and wellness. What does preventive care include?  A yearly physical exam. This is also called an annual well check.  Dental exams once or twice a year.  Routine eye exams. Ask your health care provider how often you should have your eyes checked.  Personal lifestyle choices, including: ? Daily care of your teeth and gums. ? Regular physical activity. ? Eating a healthy diet. ? Avoiding tobacco and drug use. ? Limiting alcohol use. ? Practicing safe sex. ? Taking vitamin and mineral supplements as recommended by your health care provider. What happens during an annual well check? The services and screenings done by your health care provider during your annual well check will depend on your age, overall health, lifestyle risk factors, and family history of disease. Counseling Your health care provider may ask you questions about your:  Alcohol use.  Tobacco use.  Drug use.  Emotional well-being.  Home and relationship well-being.  Sexual activity.  Eating habits.  Work and work Statistician.  Method of birth control.  Menstrual cycle.  Pregnancy history.  Screening You may have the following tests  or measurements:  Height, weight, and BMI.  Diabetes screening. This is done by checking your blood sugar (glucose) after you have not eaten for a while (fasting).  Blood pressure.  Lipid and cholesterol levels. These may be checked every 5 years starting at age 34.  Skin check.  Hepatitis C blood test.  Hepatitis B blood test.  Sexually transmitted  disease (STD) testing.  BRCA-related cancer screening. This may be done if you have a family history of breast, ovarian, tubal, or peritoneal cancers.  Pelvic exam and Pap test. This may be done every 3 years starting at age 80. Starting at age 34, this may be done every 5 years if you have a Pap test in combination with an HPV test.  Discuss your test results, treatment options, and if necessary, the need for more tests with your health care provider. Vaccines Your health care provider may recommend certain vaccines, such as:  Influenza vaccine. This is recommended every year.  Tetanus, diphtheria, and acellular pertussis (Tdap, Td) vaccine. You may need a Td booster every 10 years.  Varicella vaccine. You may need this if you have not been vaccinated.  HPV vaccine. If you are 40 or younger, you may need three doses over 6 months.  Measles, mumps, and rubella (MMR) vaccine. You may need at least one dose of MMR. You may also need a second dose.  Pneumococcal 13-valent conjugate (PCV13) vaccine. You may need this if you have certain conditions and were not previously vaccinated.  Pneumococcal polysaccharide (PPSV23) vaccine. You may need one or two doses if you smoke cigarettes or if you have certain conditions.  Meningococcal vaccine. One dose is recommended if you are age 64-21 years and a first-year college student living in a residence hall, or if you have one of several medical conditions. You may also need additional booster doses.  Hepatitis A vaccine. You may need this if you have certain conditions or if you travel or work in places where you may be exposed to hepatitis A.  Hepatitis B vaccine. You may need this if you have certain conditions or if you travel or work in places where you may be exposed to hepatitis B.  Haemophilus influenzae type b (Hib) vaccine. You may need this if you have certain risk factors.  Talk to your health care provider about which screenings and  vaccines you need and how often you need them. This information is not intended to replace advice given to you by your health care provider. Make sure you discuss any questions you have with your health care provider. Document Released: 06/27/2001 Document Revised: 01/19/2016 Document Reviewed: 03/02/2015 Elsevier Interactive Patient Education  [employer].

## 2017-06-19 NOTE — Telephone Encounter (Signed)
Stacie called from  CT to clarify if contrast was needed with the order for a CT scan chest that was placed today as a follow up does not require contrast.  Dr Selena BattenKim was informed of this and stated to change the order, Darnelle MaffucciStacie was informed and stated she will change this in Epic.

## 2017-06-26 ENCOUNTER — Ambulatory Visit (INDEPENDENT_AMBULATORY_CARE_PROVIDER_SITE_OTHER)
Admission: RE | Admit: 2017-06-26 | Discharge: 2017-06-26 | Disposition: A | Discharge status: Home / Self Care | Payer: PRIVATE HEALTH INSURANCE | Source: Ambulatory Visit | Attending: Family Medicine | Admitting: Family Medicine

## 2017-06-26 DIAGNOSIS — R911 Solitary pulmonary nodule: Secondary | ICD-10-CM

## 2017-07-11 ENCOUNTER — Institutional Professional Consult (permissible substitution): Payer: PRIVATE HEALTH INSURANCE | Admitting: Pulmonary Disease

## 2017-07-26 ENCOUNTER — Other Ambulatory Visit: Payer: Self-pay | Admitting: Family Medicine

## 2017-07-31 ENCOUNTER — Telehealth: Payer: Self-pay | Admitting: Family Medicine

## 2017-07-31 DIAGNOSIS — B182 Chronic viral hepatitis C: Secondary | ICD-10-CM

## 2017-07-31 NOTE — Telephone Encounter (Signed)
I would prefer that she see our gastroenterologists if possible as they have a very good track record of caring for our patients and it is much easier to communicate with them and coordinate her care. However, if she feels it is very important to transfer - ok to refer. Thanks.

## 2017-07-31 NOTE — Telephone Encounter (Signed)
Copied from CRM [id number]. Topic: Referral - Request >> Jul 31, 2017 10:03 AM Percival SpanishKennedy, Cheryl W wrote:  [facility]  but [place]. [facility] and they are in network with her insurance and she has contacted them but they require a referral . She is asking that a referral be sent to to them   Pt phone number [phone]

## 2017-08-02 ENCOUNTER — Telehealth: Payer: Self-pay | Admitting: *Deleted

## 2017-08-02 NOTE — Telephone Encounter (Signed)
I left a message for the pt to return my call. 

## 2017-08-02 NOTE — Telephone Encounter (Signed)
I called the pt and informed her of the message below.  Patient states she needs the referral as [place] and has to take the day off from [place].  She is aware the referral was placed and someone will call with appt info.

## 2017-08-02 NOTE — Telephone Encounter (Signed)
See prior note

## 2017-10-16 ENCOUNTER — Other Ambulatory Visit: Payer: Self-pay | Admitting: Family Medicine

## 2017-11-14 ENCOUNTER — Ambulatory Visit: Payer: PRIVATE HEALTH INSURANCE | Admitting: Family Medicine

## 2018-01-08 ENCOUNTER — Encounter: Payer: Self-pay | Admitting: Family Medicine

## 2018-01-08 ENCOUNTER — Encounter: Payer: Self-pay | Admitting: Neurology

## 2018-01-08 ENCOUNTER — Ambulatory Visit: Payer: PRIVATE HEALTH INSURANCE | Admitting: Family Medicine

## 2018-01-08 VITALS — BP 110/80 | HR 107 | Temp 98.5°F | Ht 60.0 in | Wt 213.5 lb

## 2018-01-08 DIAGNOSIS — R5383 Other fatigue: Secondary | ICD-10-CM | POA: Diagnosis not present

## 2018-01-08 DIAGNOSIS — R51 Headache: Secondary | ICD-10-CM

## 2018-01-08 DIAGNOSIS — R197 Diarrhea, unspecified: Secondary | ICD-10-CM | POA: Diagnosis not present

## 2018-01-08 DIAGNOSIS — R195 Other fecal abnormalities: Secondary | ICD-10-CM | POA: Diagnosis not present

## 2018-01-08 DIAGNOSIS — R519 Headache, unspecified: Secondary | ICD-10-CM

## 2018-01-08 LAB — CBC
HCT: 43.5 % (ref 36.0–46.0)
Hemoglobin: 14.9 g/dL (ref 12.0–15.0)
MCHC: 34.3 g/dL (ref 30.0–36.0)
MCV: 91 fl (ref 78.0–100.0)
PLATELETS: 328 10*3/uL (ref 150.0–400.0)
RBC: 4.79 Mil/uL (ref 3.87–5.11)
RDW: 13.1 % (ref 11.5–15.5)
WBC: 7.7 10*3/uL (ref 4.0–10.5)

## 2018-01-08 LAB — BASIC METABOLIC PANEL
BUN: 10 mg/dL (ref 6–23)
CALCIUM: 9.5 mg/dL (ref 8.4–10.5)
CO2: 28 mEq/L (ref 19–32)
Chloride: 105 mEq/L (ref 96–112)
Creatinine, Ser: 1.15 mg/dL (ref 0.40–1.20)
GFR: 55.49 mL/min — AB (ref >60.00)
Glucose, Bld: 69 mg/dL — ABNORMAL LOW (ref 70–99)
POTASSIUM: 4.4 meq/L (ref 3.5–5.1)
SODIUM: 138 meq/L (ref 135–145)

## 2018-01-08 NOTE — Progress Notes (Signed)
HPI:  Using dictation device. Unfortunately this device frequently misinterprets words/phrases.  Acute visit for diarrhea/headache/fatigue: -started while finishing course of xifaxan from GI for her IBS -has a history of migraines -now reports: Had episode of watery black stools 3 days ago.  This was just as she was finishing up her treatment for IBS.  Dark watery stools resolved, but she also had a headache.  It was typical of her prior migraines, except for not as bad.  She had a band of pain around her head, some photosensitivity and some nausea.  She also felt tired.  She did contact her gastroenterologist and yesterday went to urgent care clinic.  She was treated with Toradol and Phenergan with help the headache some. -She has had migraines for a long time and has them about 1/month to every 3 months.  When she has them the last for several days and can be quite severe.  She would like to see a neurologist.  In the past NSAIDs seem to help, but her gastroenterologist advised her not to take these, as she may have gastroparesis.  Tylenol helps some, but not great.  Zofran seems to help some as well.  She has tried triptan's, but they give her chest pain and she is very afraid to try them.  She would like to see a neurologist to talk about other options for her headaches. -denies: Any further diarrhea now, bright red blood per rectum, any further dark stools, inability to tolerate oral intake of fluids, vomiting, abd pain, worse headache of her life, weakness, numbness, speech changes or unusual visual disturbances atypical for her migraines -Barometric pressure changes are a trigger for her headaches, we recently had this  ROS: See pertinent positives and negatives per HPI.  Past Medical History:  Diagnosis Date  . Abnormal pap    w LEEP  . Alcoholism (HCC)   . Allergy   . Asthma   . Blood in stool   . Depression (emotion)   . Frequent headaches   . Hepatitis C, chronic (HCC)    hep c  tx successful  . Irritable bowel syndrome   . OSA on CPAP - sees Dr. Jenne CampusMcQueen in ENT 10/09/2013  . Seasonal allergies    scratch test in 2006///mold issue in house    Past Surgical History:  Procedure Laterality Date  . DILATION AND CURETTAGE OF UTERUS  1996, 2001, 2002   x 3  . LEEP  2008  . NASAL FRACTURE SURGERY  1999   repair  . NASAL SEPTOPLASTY W/ TURBINOPLASTY      Family History  Problem Relation Age of Onset  . Emphysema Mother   . Colon polyps Mother   . Diverticulitis Mother   . Heart disease Father   . Cancer Father        unsure??  . Hypertension Father   . Hepatitis C Father   . Stroke Maternal Grandmother   . Osteoporosis Maternal Grandmother     SOCIAL HX: See HPI    Current Outpatient Medications:  .  albuterol (PROAIR HFA) 108 (90 Base) MCG/ACT inhaler, Inhale 2 puffs into the lungs every 6 (six) hours as needed for wheezing or shortness of breath., Disp: 1 Inhaler, Rfl: 3 .  AMBULATORY NON FORMULARY MEDICATION, Medication Name: VSL#3 112.5 billion: Take 1 capsule twice a day, Disp: 20 capsule, Rfl: 0 .  ARIPiprazole (ABILIFY) 15 MG tablet, Take 15 mg by mouth daily.  , Disp: , Rfl:  .  cetirizine (ZYRTEC)  10 MG tablet, Take 10 mg by mouth daily., Disp: , Rfl:  .  desvenlafaxine (PRISTIQ) 50 MG 24 hr tablet, TK 1 T PO QAM, Disp: , Rfl: 11 .  dicyclomine (BENTYL) 10 MG capsule, Take 1 capsule (10 mg total) by mouth every 8 (eight) hours as needed., Disp: 90 capsule, Rfl: 1 .  KURVELO 0.15-30 MG-MCG tablet, TAKE 1 TABLET BY MOUTH EVERY DAY, Disp: 84 tablet, Rfl: 2 .  Multiple Vitamin (MULTIVITAMINS PO), Take by mouth., Disp: , Rfl:  .  ondansetron (ZOFRAN ODT) 8 MG disintegrating tablet, Take 1 tablet (8 mg total) by mouth every 8 (eight) hours as needed for nausea or vomiting., Disp: 30 tablet, Rfl: 3 .  SUMAtriptan (IMITREX) 100 MG tablet, May repeat in 2 hours if headache persists or recurs. No more than 2 in 24 hours., Disp: 10 tablet, Rfl:  1  EXAM:  Vitals:   01/08/18 1404  BP: 110/80  Pulse: (!) 107  Temp: 98.5 F (36.9 C)  SpO2: 97%    Body mass index is 41.7 kg/m.  GENERAL: vitals reviewed and listed above, alert, oriented, appears well hydrated and in no acute distress  HEENT: atraumatic, PERRLA, conjunttiva clear, no obvious abnormalities on inspection of external nose and ears  NECK: no obvious masses on inspection  LUNGS: clear to auscultation bilaterally, no wheezes, rales or rhonchi, good air movement  CV: HRRR on my exam, no peripheral edema  ABD: BS+, soft, NTTP  MS: moves all extremities without noticeable abnormality  PSYCH/NEURO: pleasant and cooperative, no obvious depression or anxiety, cranial nerves II through XII grossly intact, finger-nose normal  ASSESSMENT AND PLAN:  Discussed the following assessment and plan:  Diarrhea, unspecified type - Plan: Basic metabolic panel, CBC  Nonintractable headache, unspecified chronicity pattern, unspecified headache type  Dark stools - Plan: Basic metabolic panel, POC Hemoccult Bld/Stl (3-Cd Home Screen)  Fatigue, unspecified type  -For the bowel issues, did advised that she discuss this further with her gastroenterologist.  Today will check electrolytes, CBC and stool cards for blood.  If abnormalities that suggest bleeding will forward to her gastroenterologist. -For her migraines, we will send a referral to the neurologist, also discussed things she can try in the interim such as ice, Tylenol and safe dosing, Zofran if this works for her. -It seems like she could have had a reaction to the medication she took for her bowels, versus gastroenteritis, versus her IBS versus other and the headache seems to fit with her classic migraines and could have been triggered by this or the barometric pressure changes. -follow up 1 month -Patient advised to return or notify a doctor immediately if symptoms worsen or persist or new concerns arise.  Patient  Instructions  BEFORE YOU LEAVE: -stool cards for blood -lab -follow up: 1 month, sooner as needed  See your gastroenterologist about the bowel issues and recent treatment. Complete the stool cards and we would send these to the gastroenterologist if abnormal.   No dairy for 1 week.  See emergency care if bleeding from bowels, severe diarrhea, worst headache of your life or any severe symptoms.  We sent a referral to neurology about the migraines.  In the interim try ice, tylenol per insturctions, topical menthol, zofran if needed.  I hope you are feeling better soon! Seek care promptly if your symptoms worsen, new concerns arise or you are not improving with treatment.      Terressa Koyanagi, DO

## 2018-01-08 NOTE — Patient Instructions (Signed)
BEFORE YOU LEAVE: -stool cards for blood -lab -follow up: 1 month, sooner as needed  See your gastroenterologist about the bowel issues and recent treatment. Complete the stool cards and we would send these to the gastroenterologist if abnormal.   No dairy for 1 week.  See emergency care if bleeding from bowels, severe diarrhea, worst headache of your life or any severe symptoms.  We sent a referral to neurology about the migraines.  In the interim try ice, tylenol per insturctions, topical menthol, zofran if needed.  I hope you are feeling better soon! Seek care promptly if your symptoms worsen, new concerns arise or you are not improving with treatment.

## 2018-02-07 ENCOUNTER — Ambulatory Visit: Payer: PRIVATE HEALTH INSURANCE | Admitting: Family Medicine

## 2018-03-04 ENCOUNTER — Ambulatory Visit: Payer: PRIVATE HEALTH INSURANCE | Admitting: Neurology

## 2018-04-03 ENCOUNTER — Encounter: Payer: Self-pay | Admitting: Internal Medicine

## 2018-04-03 ENCOUNTER — Ambulatory Visit: Payer: PRIVATE HEALTH INSURANCE | Admitting: Internal Medicine

## 2018-04-03 VITALS — BP 98/60 | HR 87 | Temp 98.3°F | Wt 212.6 lb

## 2018-04-03 DIAGNOSIS — R509 Fever, unspecified: Secondary | ICD-10-CM | POA: Diagnosis not present

## 2018-04-03 DIAGNOSIS — J45991 Cough variant asthma: Secondary | ICD-10-CM | POA: Diagnosis not present

## 2018-04-03 DIAGNOSIS — J989 Respiratory disorder, unspecified: Secondary | ICD-10-CM

## 2018-04-03 LAB — POC INFLUENZA A&B (BINAX/QUICKVUE)
Influenza A, POC: NEGATIVE
Influenza B, POC: NEGATIVE

## 2018-04-03 NOTE — Patient Instructions (Addendum)
Exam is consitent with a viral respiratory infection   Flu like but  Negative testing at this time.   Symptomatic  Treatment rest and fluids   Take your albuterol as needed for wheezing until better  Fever is usually gone in 2-3 days .  Out of work until fever gone for 24 hours .  Get back with us if not as expected but cough can last 2 weeks but should be a lot better.

## 2018-04-03 NOTE — Progress Notes (Signed)
Acute Office Visit  Subjective:    Patient ID: Nicole [name], female    DOB: 01/06/1976, 42 y.o.   MRN: [id number]  Chief Complaint  Patient presents with  . Fever    Fever, chills, sweats, cough with wheezing, dry cough and body aches. Pt taking Robitussin. fever reached 101.7. Nausea and vomiting x 1 day. Headache    HPI Patient is in today  SDA PCP NA  Onset.   Before lunch yesterday  Felt hot and sweating and other cough  Temp 101 .7    And   thjen took   tussin and  Orcs with help   Albuterol  2 x .    w help no wheezing today  Ex smoker and asthma  Had lfu vaccine 11 3  No rash   Works in [employer]  No new exposures  Past Medical History:  Diagnosis Date  . Abnormal pap    w LEEP  . Alcoholism (HCC)   . Allergy   . Asthma   . Blood in stool   . Depression (emotion)   . Frequent headaches   . Hepatitis C, chronic (HCC)    hep c tx successful  . Irritable bowel syndrome   . OSA on CPAP - sees Dr. Jenne Campus in ENT 10/09/2013  . Seasonal allergies    scratch test in 2006///mold issue in house    Past Surgical History:  Procedure Laterality Date  . DILATION AND CURETTAGE OF UTERUS  1996, 2001, 2002   x 3  . LEEP  2008  . NASAL FRACTURE SURGERY  1999   repair  . NASAL SEPTOPLASTY W/ TURBINOPLASTY      Family History  Problem Relation Age of Onset  . Emphysema Mother   . Colon polyps Mother   . Diverticulitis Mother   . Heart disease Father   . Cancer Father        unsure??  . Hypertension Father   . Hepatitis C Father   . Stroke Maternal Grandmother   . Osteoporosis Maternal Grandmother     Social History   Socioeconomic History  . Marital status: Married    Spouse name: Not on file  . Number of children: 0  . Years of education: Not on file  . Highest education level: Not on file  Occupational History  . Occupation: Adult nurse: [employer]  Social Needs  . Financial resource strain: Not on file  . Food  insecurity:    Worry: Not on file    Inability: Not on file  . Transportation needs:    Medical: Not on file    Non-medical: Not on file  Tobacco Use  . Smoking status: Former Smoker    Packs/day: 1.50    Years: 15.00    Pack years: 22.50    Types: Cigarettes    Last attempt to quit: 05/15/2005    Years since quitting: 12.8  . Smokeless tobacco: Never Used  . Tobacco comment: quit in 2007, smoked for 13 years   Substance and Sexual Activity  . Alcohol use: No    Alcohol/week: 0.0 standard drinks    Comment: quit drinking on 2004---relapse in 2007---QUIT 07/2005  . Drug use: No    Comment: IV drug use///street drugs///inhalants  QUIT 07/2005  . Sexual activity: Not on file  Lifestyle  . Physical activity:    Days per week: Not on file    Minutes per session: Not on file  .  Stress: Not on file  Relationships  . Social connections:    Talks on phone: Not on file    Gets together: Not on file    Attends religious service: Not on file    Active member of club or organization: Not on file    Attends meetings of clubs or organizations: Not on file    Relationship status: Not on file  . Intimate partner violence:    Fear of current or ex partner: Not on file    Emotionally abused: Not on file    Physically abused: Not on file    Forced sexual activity: Not on file  Other Topics Concern  . Not on file  Social History Narrative   Work or School: Glass blower/designergraphic designer      Home Situation: lives with husband      Spiritual Beliefs: none      Lifestyle: no regular exercise; diet is fair                Outpatient Medications Prior to Visit  Medication Sig Dispense Refill  . albuterol (PROAIR HFA) 108 (90 Base) MCG/ACT inhaler Inhale 2 puffs into the lungs every 6 (six) hours as needed for wheezing or shortness of breath. 1 Inhaler 3  . AMBULATORY NON FORMULARY MEDICATION Medication Name: VSL#3 112.5 billion: Take 1 capsule twice a day 20 capsule 0  . ARIPiprazole (ABILIFY) 15 MG  tablet Take 15 mg by mouth daily.      . cetirizine (ZYRTEC) 10 MG tablet Take 10 mg by mouth daily.    Marland Kitchen. desvenlafaxine (PRISTIQ) 50 MG 24 hr tablet TK 1 T PO QAM  11  . dicyclomine (BENTYL) 10 MG capsule Take 1 capsule (10 mg total) by mouth every 8 (eight) hours as needed. 90 capsule 1  . KURVELO 0.15-30 MG-MCG tablet TAKE 1 TABLET BY MOUTH EVERY DAY 84 tablet 2  . Multiple Vitamin (MULTIVITAMINS PO) Take by mouth.    . ondansetron (ZOFRAN ODT) 8 MG disintegrating tablet Take 1 tablet (8 mg total) by mouth every 8 (eight) hours as needed for nausea or vomiting. 30 tablet 3  . SUMAtriptan (IMITREX) 100 MG tablet May repeat in 2 hours if headache persists or recurs. No more than 2 in 24 hours. 10 tablet 1   No facility-administered medications prior to visit.     Allergies  Allergen Reactions  . Cashew Nut Oil Anaphylaxis    Cashews and [place] nuts  . Other Anaphylaxis    Nuts-[place] and cashews  . Cefaclor Rash  . Nickel Rash    ROS Neg cp sob current rash diarrhea   pain    Objective:    Physical Exam  BP 98/60 (BP Location: Left Arm, Patient Position: Sitting, Cuff Size: Normal)   Pulse 87   Temp 98.3 F (36.8 C) (Oral)   Wt 212 lb 9.6 oz (96.4 kg)   SpO2 98%   BMI 41.52 kg/m  Wt Readings from Last 3 Encounters:  04/03/18 212 lb 9.6 oz (96.4 kg)  01/08/18 213 lb 8 oz (96.8 kg)  06/19/17 204 lb 8 oz (92.8 kg)  WDWN in NAD  quiet respirations; mildly congested  somewhat hoarse. ocass dry cough and congetsion  Non toxic . HEENT: Normocephalic ;atraumatic , Eyes;  PERRL, EOMs  Full, lids and conjunctiva clear,,Ears: no deformities, canals nl, TM landmarks normal, Nose: no deformity or discharge but congested;face non tender Mouth : OP clear without lesion or edema . Neck: Supple without adenopathy  or masses or bruits Chest:  Clear to A  without wheezes rales or rhonchi CV:  S1-S2 no gallops or murmurs peripheral perfusion is normal Skin :nl perfusion and no acute rashes      Health Maintenance Due  Topic Date Due  . INFLUENZA VACCINE  12/13/2017    There are no preventive care reminders to display for this patient.   Lab Results  Component Value Date   TSH 2.628 04/20/2011   Lab Results  Component Value Date   WBC 7.7 01/08/2018   HGB 14.9 01/08/2018   HCT 43.5 01/08/2018   MCV 91.0 01/08/2018   PLT 328.0 01/08/2018   Lab Results  Component Value Date   NA 138 01/08/2018   K 4.4 01/08/2018   CO2 28 01/08/2018   GLUCOSE 69 (L) 01/08/2018   BUN 10 01/08/2018   CREATININE 1.15 01/08/2018   BILITOT 0.4 12/01/2015   ALKPHOS 34 (L) 12/01/2015   AST 18 12/01/2015   ALT 25 12/01/2015   PROT 7.1 12/01/2015   ALBUMIN 4.0 12/01/2015   CALCIUM 9.5 01/08/2018   GFR 55.49 (L) 01/08/2018   Lab Results  Component Value Date   CHOL 183 06/19/2017   Lab Results  Component Value Date   HDL 53.20 06/19/2017   Lab Results  Component Value Date   LDLCALC 96 06/19/2017   Lab Results  Component Value Date   TRIG 171.0 (H) 06/19/2017   Lab Results  Component Value Date   CHOLHDL 3 06/19/2017   Lab Results  Component Value Date   HGBA1C 5.5 06/19/2017   poct neg flu a and b     Assessment & Plan:   Problem List Items Addressed This Visit      Unprioritized   Cough variant asthma   Relevant Orders   POC Influenza A&B (Binax test) (Completed)    Other Visit Diagnoses    Febrile respiratory illness    -  Primary   Relevant Orders   POC Influenza A&B (Binax test) (Completed)     Flu like illness but no alarming findings  And screen neg in immunized pat . Supportive care  Fu if asthma not controlled or   If  persistent or progressive out of  Expected  Not for work   No orders of the defined types were placed in this encounter.    Berniece Andreas, MD

## 2018-05-16 ENCOUNTER — Encounter: Payer: PRIVATE HEALTH INSURANCE | Admitting: Family Medicine

## 2018-05-16 NOTE — Progress Notes (Deleted)
HPI:  Using dictation device. Unfortunately this device frequently misinterprets words/phrases.  Here for CPE:  -Concerns and/or follow up today: none ***  Chronic medical problems summarized below were reviewed for changes.***.   -Diet: variety of foods, balance and well rounded, larger portion sizes -Exercise: no regular exercise -Taking folic acid, vitamin D or calcium: no -Diabetes and Dyslipidemia Screening: *** -Vaccines: see vaccine section EPIC -pap history: *** -FDLMP: see nursing notes -sexual activity: *** -wants STI testing (Hep C if born [birth year]-65): no -FH breast, colon or ovarian ca: see FH Last mammogram: *** Last colon cancer screening: *** Breast Ca Risk Assessment: see family history and pt history DEXA (>/= 73): ***  -Alcohol, Tobacco, drug use: see social history  Review of Systems - no fevers, unintentional weight loss, vision loss, hearing loss, chest pain, sob, hemoptysis, melena, hematochezia, hematuria, genital discharge, changing or concerning skin lesions, bleeding, bruising, loc, thoughts of self harm or SI  Past Medical History:  Diagnosis Date  . Abnormal pap    w LEEP  . Alcoholism (HCC)   . Allergy   . Asthma   . Blood in stool   . Depression (emotion)   . Frequent headaches   . Hepatitis C, chronic (HCC)    hep c tx successful  . Irritable bowel syndrome   . OSA on CPAP - sees Dr. Jenne Campus in ENT 10/09/2013  . Seasonal allergies    scratch test in 2006///mold issue in house    Past Surgical History:  Procedure Laterality Date  . DILATION AND CURETTAGE OF UTERUS  1996, 2001, 2002   x 3  . LEEP  2008  . NASAL FRACTURE SURGERY  1999   repair  . NASAL SEPTOPLASTY W/ TURBINOPLASTY      Family History  Problem Relation Age of Onset  . Emphysema Mother   . Colon polyps Mother   . Diverticulitis Mother   . Heart disease Father   . Cancer Father        unsure??  . Hypertension Father   . Hepatitis C Father   . Stroke Maternal  Grandmother   . Osteoporosis Maternal Grandmother     Social History   Socioeconomic History  . Marital status: Married    Spouse name: Not on file  . Number of children: 0  . Years of education: Not on file  . Highest education level: Not on file  Occupational History  . Occupation: Adult nurse: [employer]  Social Needs  . Financial resource strain: Not on file  . Food insecurity:    Worry: Not on file    Inability: Not on file  . Transportation needs:    Medical: Not on file    Non-medical: Not on file  Tobacco Use  . Smoking status: Former Smoker    Packs/day: 1.50    Years: 15.00    Pack years: 22.50    Types: Cigarettes    Last attempt to quit: 05/15/2005    Years since quitting: 13.0  . Smokeless tobacco: Never Used  . Tobacco comment: quit in 2007, smoked for 13 years   Substance and Sexual Activity  . Alcohol use: No    Alcohol/week: 0.0 standard drinks    Comment: quit drinking on 2004---relapse in 2007---QUIT 07/2005  . Drug use: No    Comment: IV drug use///street drugs///inhalants  QUIT 07/2005  . Sexual activity: Not on file  Lifestyle  . Physical activity:    Days  per week: Not on file    Minutes per session: Not on file  . Stress: Not on file  Relationships  . Social connections:    Talks on phone: Not on file    Gets together: Not on file    Attends religious service: Not on file    Active member of club or organization: Not on file    Attends meetings of clubs or organizations: Not on file    Relationship status: Not on file  Other Topics Concern  . 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  albuterol (PROAIR HFA) 108 (90 Base) MCG/ACT inhaler, Inhale 2 puffs into the lungs every 6 (six) hours as needed  for wheezing or shortness of breath., Disp: 1 Inhaler, Rfl: 3 .  AMBULATORY NON FORMULARY MEDICATION, Medication Name: VSL#3 112.5 billion: Take 1 capsule twice a day, Disp: 20 capsule, Rfl: 0 .  ARIPiprazole (ABILIFY) 15 MG tablet, Take 15 mg by mouth daily.  , Disp: , Rfl:  .  cetirizine (ZYRTEC) 10 MG tablet, Take 10 mg by mouth daily., Disp: , Rfl:  .  desvenlafaxine (PRISTIQ) 50 MG 24 hr tablet, TK 1 T PO QAM, Disp: , Rfl: 11 .  dicyclomine (BENTYL) 10 MG capsule, Take 1 capsule (10 mg total) by mouth every 8 (eight) hours as needed., Disp: 90 capsule, Rfl: 1 .  KURVELO 0.15-30 MG-MCG tablet, TAKE 1 TABLET BY MOUTH EVERY DAY, Disp: 84 tablet, Rfl: 2 .  Multiple Vitamin (MULTIVITAMINS PO), Take by mouth., Disp: , Rfl:  .  ondansetron (ZOFRAN ODT) 8 MG disintegrating tablet, Take 1 tablet (8 mg total) by mouth every 8 (eight) hours as needed for nausea or vomiting., Disp: 30 tablet, Rfl: 3  EXAM:  There were no vitals filed for this visit.  GENERAL: vitals reviewed and listed below, alert, oriented, appears well hydrated and in no acute distress  HEENT: head atraumatic, PERRLA, normal appearance of eyes, ears, nose and mouth. moist mucus membranes.  NECK: supple, no masses or lymphadenopathy  LUNGS: clear to auscultation bilaterally, no rales, rhonchi or wheeze  CV: HRRR, no peripheral edema or cyanosis, normal pedal pulses  ABDOMEN: bowel sounds normal, soft, non tender to palpation, no masses, no rebound or guarding  GU/BREAST: ***  SKIN: no rash or abnormal lesions  MS: normal gait, moves all extremities normally  NEURO: normal gait, speech and thought processing grossly intact, muscle tone grossly intact throughout  PSYCH: normal affect, pleasant and cooperative  ASSESSMENT AND PLAN:  Discussed the following assessment and plan:  PREVENTIVE EXAM: -Discussed and advised all [place] preventive services health task force level A and B recommendations for age, sex and  risks. -Advised at least 150 minutes of exercise per week and a healthy diet with avoidance of (less then 1 serving per week) processed foods, white starches, red meat, fast foods and sweets and consisting of: * 5-9 servings of fresh fruits and vegetables (not corn or potatoes) *nuts and seeds, beans *olives and olive oil *lean meats such as fish and white chicken  *whole grains -labs, studies and vaccines per orders this encounter  There are no diagnoses linked to this encounter. ***  Patient advised to return to clinic immediately if symptoms worsen or persist or new concerns.  There are no Patient Instructions on file for this visit.  No follow-ups on file.  Lucretia Kern, DO

## 2018-05-20 ENCOUNTER — Ambulatory Visit: Payer: PRIVATE HEALTH INSURANCE | Admitting: Neurology

## 2018-06-24 NOTE — Progress Notes (Signed)
HPI:  Using dictation device. Unfortunately this device frequently misinterprets words/phrases.  Here for CPE: Due for lipid screening, obesity follow-up.  -Concerns and/or follow up today:   The past medical history of obesity, obstructive sleep apnea, asthma, allergies, hyperlipidemia, pulmonary nodules, IBS, depression and treated hepatitis C.  See below. New complaint of respiratory symptoms that started about 2 weeks ago.  She was seen in urgent care early in the illness and was given doxycycline and a cough syrup.  Most symptoms are resolved except for an annoying hacking cough at night.  Using her albuterol from time to time, nasal saline.  No fevers, shortness of breath, wheezing, chills or malaise.  Has cough variant asthma.  Not on inhaled steroid.  The cough is keeping her up at night.  Asthma/Allergies: -alb prn, zyrtec  OSA/Obesity/HLD: -sees ENT for sleep apnea - Dr. Tami Ribas in [place] in 2014 -requested referral to [place] in 2019 -s/p sinus surgery -uses CPAP  Pulm nodule: -advised repeat CT 1-2 years per radiology recs 2017 -pt did not do, re-ordered in 2019  IBS/hx treated hep c: -sees GI -takes bentyl and probiotic  Depression: -sees Dr. Toy Care for management -meds: abilify, pristiq  -Diet: variety of foods, balance and well rounded, larger portion sizes -Exercise: no regular exercise -Taking folic acid, vitamin D or calcium: no -Diabetes and Dyslipidemia Screening: Due for lipids -Vaccines: see vaccine section EPIC -pap history: norma/hpv neg 05/2016 -FDLMP: see nursing notes -wants STI testing (Hep C if born 1945-[birth year]): no -FH breast, colon or ovarian ca: see FH Last mammogram: Discussed Last colon cancer screening: Not applicable Breast Ca Risk Assessment: see family history and pt history DEXA (>/= 65): n/a  -Alcohol, Tobacco, drug use: see social history  Review of Systems - no reported fevers, unintentional weight loss, vision loss,  hearing loss, chest pain, sob, hemoptysis, melena, hematochezia, hematuria, genital discharge, changing or concerning skin lesions, bleeding, bruising, loc, thoughts of self harm or SI  Past Medical History:  Diagnosis Date  . Abnormal pap    w LEEP  . Alcoholism ([place])   . Allergy   . Asthma   . Blood in stool   . Depression (emotion)   . Frequent headaches   . Hepatitis C, chronic (HCC)    hep c tx successful  . Irritable bowel syndrome   . OSA on CPAP - sees Dr. Tami Ribas in [place]NT 10/09/2013  . Seasonal allergies    scratch test in 2006///mold issue in house    Past Surgical History:  Procedure Laterality Date  . [place], 2001, 2002   x 3  . LEEP  2008  . NASAL FRACTURE SURGERY  1999   repair  . NASAL SEPTOPLASTY W/ TURBINOPLASTY      Family History  Problem Relation Age of Onset  . Emphysema Mother   . Colon polyps Mother   . Diverticulitis Mother   . Heart disease Father   . Cancer Father        unsure??  . Hypertension Father   . Hepatitis C Father   . Stroke Maternal Grandmother   . Osteoporosis Maternal Grandmother     Social History   Socioeconomic History  . Marital status: Married    Spouse name: Not on file  . Number of children: 0  . Years of education: Not on file  . Highest education level: Not on file  Occupational History  . Occupation: Psychiatrist: [employer]  Social Needs  . Financial resource strain: Not on file  . Food insecurity:    Worry: Not on file    Inability: Not on file  . Transportation needs:    Medical: Not on file    Non-medical: Not on file  Tobacco Use  . Smoking status: Former Smoker    Packs/day: 1.50    Years: 15.00    Pack years: 22.50    Types: Cigarettes    Last attempt to quit: 05/15/2005    Years since quitting: 13.1  . Smokeless tobacco: Never Used  . Tobacco comment: quit in 2007, smoked for 13 years   Substance and Sexual Activity  . Alcohol  use: No    Alcohol/week: 0.0 standard drinks    Comment: quit drinking on 2004---relapse in 2007---QUIT 07/2005  . Drug use: No    Comment: IV drug use///street drugs///inhalants  QUIT 07/2005  . Sexual activity: Not on file  Lifestyle  . Physical activity:    Days per week: Not on file    Minutes per session: Not on file  . Stress: Not on file  Relationships  . Social connections:    Talks on phone: Not on file    Gets together: Not on file    Attends religious service: Not on file    Active member of club or organization: Not on file    Attends meetings of clubs or organizations: Not on file    Relationship status: Not on file  Other Topics Concern  . Not on file  Social History Narrative   Work or School: Systems analyst Situation: lives with husband      Spiritual Beliefs: none      Lifestyle: no regular exercise; diet is fair                 Current Outpatient Medications:  .  albuterol (PROAIR HFA) 108 (90 Base) MCG/ACT inhaler, Inhale 2 puffs into the lungs every 6 (six) hours as needed for wheezing or shortness of breath., Disp: 1 Inhaler, Rfl: 3 .  ARIPiprazole (ABILIFY) 15 MG tablet, Take 15 mg by mouth daily.  , Disp: , Rfl:  .  cetirizine (ZYRTEC) 10 MG tablet, Take 10 mg by mouth daily., Disp: , Rfl:  .  desvenlafaxine (PRISTIQ) 100 MG 24 hr tablet, Take 100 mg by mouth daily., Disp: , Rfl:  .  dicyclomine (BENTYL) 10 MG capsule, Take 1 capsule (10 mg total) by mouth every 8 (eight) hours as needed., Disp: 90 capsule, Rfl: 1 .  KURVELO 0.15-30 MG-MCG tablet, TAKE 1 TABLET BY MOUTH EVERY DAY, Disp: 84 tablet, Rfl: 2 .  Multiple Vitamin (MULTIVITAMINS PO), Take by mouth., Disp: , Rfl:  .  ondansetron (ZOFRAN ODT) 8 MG disintegrating tablet, Take 1 tablet (8 mg total) by mouth every 8 (eight) hours as needed for nausea or vomiting., Disp: 30 tablet, Rfl: 3 .  predniSONE (DELTASONE) 20 MG tablet, Take 2 tablets (40 mg total) by mouth daily with  breakfast., Disp: 8 tablet, Rfl: 0  EXAM:  Vitals:   06/25/18 0859  BP: 98/70  Pulse: (!) 103  Temp: 98.1 F (36.7 C)  SpO2: 98%    GENERAL: vitals reviewed and listed below, alert, oriented, appears well hydrated and in no acute distress  HEENT: head atraumatic, PERRLA, normal appearance of eyes, ears, nose and mouth. moist mucus membranes.  NECK: supple, no masses or lymphadenopathy  LUNGS: clear to  auscultation bilaterally, no rales, rhonchi or wheeze  CV: HRRR, no peripheral edema or cyanosis, normal pedal pulses  ABDOMEN: bowel sounds normal, soft, non tender to palpation, no masses, no rebound or guarding  BREAST: normal appearance - no skin lesions or discharge noted on inspection of both breasts, on palpation of both breast and axillary region no suspicious lesions appreciated today  GU: Declined  SKIN: no rash or abnormal lesions, on exposed portions of skin, declined full exam  MS: normal gait, moves all extremities normally  NEURO: normal gait, speech and thought processing grossly intact, muscle tone grossly intact throughout  PSYCH: normal affect, pleasant and cooperative  ASSESSMENT AND PLAN:  Discussed the following assessment and plan:  PREVENTIVE EXAM: -Discussed and advised all [place] preventive services health task force level A and B recommendations for age, sex and risks. -Advised at least 150 minutes of exercise per week and a healthy diet with avoidance of (less then 1 serving per week) processed foods, white starches, red meat, fast foods and sweets and consisting of: * 5-9 servings of fresh fruits and vegetables (not corn or potatoes) *nuts and seeds, beans *olives and olive oil *lean meats such as fish and white chicken  *whole grains -labs, studies and vaccines per orders this encounter  2. Cough variant asthma 3. Bronchitis -Suspect cough is related to the cough variant asthma and recent respiratory illness -Discussed potential etiologies,  potential treatment options and risk -She opted to try a prednisone burst, Rx sent. -Albuterol as needed and she is advised to follow-up if any worsening or symptoms persist  4. Episode of recurrent major depressive disorder, unspecified depression episode severity ([place]) -Stable, currently looking for a new job mood has been good despite this -See PHQ 9  5. Morbid obesity ([place]) -Lifestyle recommendations, summarized below - Comprehensive metabolic panel; Future - Lipid panel; Future  6. Hyperlipidemia, unspecified hyperlipidemia type-labs per orders, lifestyle recommendations  Patient advised to return to clinic immediately if symptoms worsen or persist or new concerns.  Patient Instructions  BEFORE YOU LEAVE: -set up fasting lab visit in 1 month -follow up: 3-4 months  Take the prednisone as prescribed for the cough/asthma. Albuterol as needed. Follow up if worsening or not resolved with treatment.  We have ordered labs or studies at this visit. It can take up to 1-2 weeks for results and processing. IF results require follow up or explanation, we will call you with instructions. Clinically stable results will be released to your [facility]. If you have not heard from [place] or cannot find your results in [facility] in 2 weeks please contact our office at [phone].  If you are not yet signed up for [facility], please consider signing up.   We recommend the following healthy lifestyle for LIFE: 1) Small portions. But, make sure to get regular (at least 3 per day), healthy meals and small healthy snacks if needed.  2) Eat a healthy clean diet.   TRY TO EAT: -at least 5-7 servings of low sugar, colorful, and nutrient rich vegetables per day (not corn, potatoes or bananas.) -berries are the best choice if you wish to eat fruit (only eat small amounts if trying to reduce weight)  -lean meets (fish, white meat of chicken or [place]) -vegan proteins for some meals - beans or tofu, whole grains,  nuts and seeds -Replace bad fats with good fats - good fats include: fish, nuts and seeds, canola oil, olive oil -small amounts of low fat or non fat dairy -  small amounts of100 % whole grains - check the lables -drink plenty of water  AVOID: -SUGAR, sweets, anything with added sugar, corn syrup or sweeteners - must read labels as even foods advertised as "healthy" often are loaded with sugar -if you must have a sweetener, small amounts of stevia may be best -sweetened beverages and artificially sweetened beverages -simple starches (rice, bread, potatoes, pasta, chips, etc - small amounts of 100% whole grains are ok) -red meat, pork, butter -fried foods, fast food, processed food, excessive dairy, eggs and coconut.  3)Get at least 150 minutes of sweaty aerobic exercise per week.  4)Reduce stress - consider counseling, meditation and relaxation to balance other aspects of your life.   Preventive Care 18-39 Years, Female Preventive care refers to lifestyle choices and visits with your health care provider that can promote health and wellness. What does preventive care include?   A yearly physical exam. This is also called an annual well check.  Dental exams once or twice a year.  Routine eye exams. Ask your health care provider how often you should have your eyes checked.  Personal lifestyle choices, including: ? Daily care of your teeth and gums. ? Regular physical activity. ? Eating a healthy diet. ? Avoiding tobacco and drug use. ? Limiting alcohol use. ? Practicing safe sex. ? Taking vitamin and mineral supplements as recommended by your health care provider. What happens during an annual well check? The services and screenings done by your health care provider during your annual well check will depend on your age, overall health, lifestyle risk factors, and family history of disease. Counseling Your health care provider may ask you questions about your:  Alcohol  use.  Tobacco use.  Drug use.  Emotional well-being.  Home and relationship well-being.  Sexual activity.  Eating habits.  Work and work Statistician.  Method of birth control.  Menstrual cycle.  Pregnancy history. Screening You may have the following tests or measurements:  Height, weight, and BMI.  Diabetes screening. This is done by checking your blood sugar (glucose) after you have not eaten for a while (fasting).  Blood pressure.  Lipid and cholesterol levels. These may be checked every 5 years starting at age 58.  Skin check.  Hepatitis C blood test.  Hepatitis B blood test.  Sexually transmitted disease (STD) testing.  BRCA-related cancer screening. This may be done if you have a family history of breast, ovarian, tubal, or peritoneal cancers.  Pelvic exam and Pap test. This may be done every 3 years starting at age 14. Starting at age 53, this may be done every 5 years if you have a Pap test in combination with an HPV test. Discuss your test results, treatment options, and if necessary, the need for more tests with your health care provider. Vaccines Your health care provider may recommend certain vaccines, such as:  Influenza vaccine. This is recommended every year.  Tetanus, diphtheria, and acellular pertussis (Tdap, Td) vaccine. You may need a Td booster every 10 years.  Varicella vaccine. You may need this if you have not been vaccinated.  HPV vaccine. If you are 66 or younger, you may need three doses over 6 months.  Measles, mumps, and rubella (MMR) vaccine. You may need at least one dose of MMR. You may also need a second dose.  Pneumococcal 13-valent conjugate (PCV13) vaccine. You may need this if you have certain conditions and were not previously vaccinated.  Pneumococcal polysaccharide (PPSV23) vaccine. You may need one or  two doses if you smoke cigarettes or if you have certain conditions.  Meningococcal vaccine. One dose is recommended  if you are age 27-21 years and a first-year college student living in a residence hall, or if you have one of several medical conditions. You may also need additional booster doses.  Hepatitis A vaccine. You may need this if you have certain conditions or if you travel or work in places where you may be exposed to hepatitis A.  Hepatitis B vaccine. You may need this if you have certain conditions or if you travel or work in places where you may be exposed to hepatitis B.  Haemophilus influenzae type b (Hib) vaccine. You may need this if you have certain risk factors. Talk to your health care provider about which screenings and vaccines you need and how often you need them. This information is not intended to replace advice given to you by your health care provider. Make sure you discuss any questions you have with your health care provider. Document Released: 06/27/2001 Document Revised: 12/12/2016 Document Reviewed: 03/02/2015 Elsevier Interactive Patient Education  2019 [employer].          No follow-ups on file.  Lucretia Kern, DO

## 2018-06-25 ENCOUNTER — Encounter: Payer: Self-pay | Admitting: Family Medicine

## 2018-06-25 ENCOUNTER — Ambulatory Visit (INDEPENDENT_AMBULATORY_CARE_PROVIDER_SITE_OTHER): Payer: PRIVATE HEALTH INSURANCE | Admitting: Family Medicine

## 2018-06-25 VITALS — BP 98/70 | HR 103 | Temp 98.1°F | Ht 60.0 in | Wt 203.6 lb

## 2018-06-25 DIAGNOSIS — F339 Major depressive disorder, recurrent, unspecified: Secondary | ICD-10-CM

## 2018-06-25 DIAGNOSIS — Z0000 Encounter for general adult medical examination without abnormal findings: Secondary | ICD-10-CM

## 2018-06-25 DIAGNOSIS — J40 Bronchitis, not specified as acute or chronic: Secondary | ICD-10-CM | POA: Diagnosis not present

## 2018-06-25 DIAGNOSIS — E785 Hyperlipidemia, unspecified: Secondary | ICD-10-CM

## 2018-06-25 DIAGNOSIS — J45991 Cough variant asthma: Secondary | ICD-10-CM | POA: Diagnosis not present

## 2018-06-25 MED ORDER — PREDNISONE 20 MG PO TABS: 40.0000 mg | ORAL_TABLET | Freq: Every day | ORAL | 0 refills | Status: DC

## 2018-06-25 NOTE — Patient Instructions (Signed)
BEFORE YOU LEAVE: -set up fasting lab visit in 1 month -follow up: 3-4 months  Take the prednisone as prescribed for the cough/asthma. Albuterol as needed. Follow up if worsening or not resolved with treatment.  We have ordered labs or studies at this visit. It can take up to 1-2 weeks for results and processing. IF results require follow up or explanation, we will call you with instructions. Clinically stable results will be released to your [facility]. If you have not heard from [place] or cannot find your results in [facility] in 2 weeks please contact our office at [phone].  If you are not yet signed up for [facility], please consider signing up.   We recommend the following healthy lifestyle for LIFE: 1) Small portions. But, make sure to get regular (at least 3 per day), healthy meals and small healthy snacks if needed.  2) Eat a healthy clean diet.   TRY TO EAT: -at least 5-7 servings of low sugar, colorful, and nutrient rich vegetables per day (not corn, potatoes or bananas.) -berries are the best choice if you wish to eat fruit (only eat small amounts if trying to reduce weight)  -lean meets (fish, white meat of chicken or [place]) -vegan proteins for some meals - beans or tofu, whole grains, nuts and seeds -Replace bad fats with good fats - good fats include: fish, nuts and seeds, canola oil, olive oil -small amounts of low fat or non fat dairy -small amounts of100 % whole grains - check the lables -drink plenty of water  AVOID: -SUGAR, sweets, anything with added sugar, corn syrup or sweeteners - must read labels as even foods advertised as "healthy" often are loaded with sugar -if you must have a sweetener, small amounts of stevia may be best -sweetened beverages and artificially sweetened beverages -simple starches (rice, bread, potatoes, pasta, chips, etc - small amounts of 100% whole grains are ok) -red meat, pork, butter -fried foods, fast food, processed food, excessive dairy,  eggs and coconut.  3)Get at least 150 minutes of sweaty aerobic exercise per week.  4)Reduce stress - consider counseling, meditation and relaxation to balance other aspects of your life.   Preventive Care 18-39 Years, Female Preventive care refers to lifestyle choices and visits with your health care provider that can promote health and wellness. What does preventive care include?   A yearly physical exam. This is also called an annual well check.  Dental exams once or twice a year.  Routine eye exams. Ask your health care provider how often you should have your eyes checked.  Personal lifestyle choices, including: ? Daily care of your teeth and gums. ? Regular physical activity. ? Eating a healthy diet. ? Avoiding tobacco and drug use. ? Limiting alcohol use. ? Practicing safe sex. ? Taking vitamin and mineral supplements as recommended by your health care provider. What happens during an annual well check? The services and screenings done by your health care provider during your annual well check will depend on your age, overall health, lifestyle risk factors, and family history of disease. Counseling Your health care provider may ask you questions about your:  Alcohol use.  Tobacco use.  Drug use.  Emotional well-being.  Home and relationship well-being.  Sexual activity.  Eating habits.  Work and work Statistician.  Method of birth control.  Menstrual cycle.  Pregnancy history. Screening You may have the following tests or measurements:  Height, weight, and BMI.  Diabetes screening. This is done by checking your  blood sugar (glucose) after you have not eaten for a while (fasting).  Blood pressure.  Lipid and cholesterol levels. These may be checked every 5 years starting at age 40.  Skin check.  Hepatitis C blood test.  Hepatitis B blood test.  Sexually transmitted disease (STD) testing.  BRCA-related cancer screening. This may be done if you  have a family history of breast, ovarian, tubal, or peritoneal cancers.  Pelvic exam and Pap test. This may be done every 3 years starting at age 49. Starting at age 95, this may be done every 5 years if you have a Pap test in combination with an HPV test. Discuss your test results, treatment options, and if necessary, the need for more tests with your health care provider. Vaccines Your health care provider may recommend certain vaccines, such as:  Influenza vaccine. This is recommended every year.  Tetanus, diphtheria, and acellular pertussis (Tdap, Td) vaccine. You may need a Td booster every 10 years.  Varicella vaccine. You may need this if you have not been vaccinated.  HPV vaccine. If you are 49 or younger, you may need three doses over 6 months.  Measles, mumps, and rubella (MMR) vaccine. You may need at least one dose of MMR. You may also need a second dose.  Pneumococcal 13-valent conjugate (PCV13) vaccine. You may need this if you have certain conditions and were not previously vaccinated.  Pneumococcal polysaccharide (PPSV23) vaccine. You may need one or two doses if you smoke cigarettes or if you have certain conditions.  Meningococcal vaccine. One dose is recommended if you are age 82-21 years and a first-year college student living in a residence hall, or if you have one of several medical conditions. You may also need additional booster doses.  Hepatitis A vaccine. You may need this if you have certain conditions or if you travel or work in places where you may be exposed to hepatitis A.  Hepatitis B vaccine. You may need this if you have certain conditions or if you travel or work in places where you may be exposed to hepatitis B.  Haemophilus influenzae type b (Hib) vaccine. You may need this if you have certain risk factors. Talk to your health care provider about which screenings and vaccines you need and how often you need them. This information is not intended to  replace advice given to you by your health care provider. Make sure you discuss any questions you have with your health care provider. Document Released: 06/27/2001 Document Revised: 12/12/2016 Document Reviewed: 03/02/2015 Elsevier Interactive Patient Education  2019 [employer].

## 2018-07-09 ENCOUNTER — Other Ambulatory Visit (INDEPENDENT_AMBULATORY_CARE_PROVIDER_SITE_OTHER): Payer: PRIVATE HEALTH INSURANCE

## 2018-07-09 DIAGNOSIS — E785 Hyperlipidemia, unspecified: Secondary | ICD-10-CM

## 2018-07-09 LAB — COMPREHENSIVE METABOLIC PANEL
ALBUMIN: 4 g/dL (ref 3.5–5.2)
ALT: 25 U/L (ref 0–35)
AST: 16 U/L (ref 0–37)
Alkaline Phosphatase: 31 U/L — ABNORMAL LOW (ref 39–117)
BILIRUBIN TOTAL: 0.5 mg/dL (ref 0.2–1.2)
BUN: 13 mg/dL (ref 6–23)
CALCIUM: 8.9 mg/dL (ref 8.4–10.5)
CO2: 25 mEq/L (ref 19–32)
CREATININE: 0.98 mg/dL (ref 0.40–1.20)
Chloride: 105 mEq/L (ref 96–112)
GFR: 62.63 mL/min (ref >60.00)
Glucose, Bld: 84 mg/dL (ref 70–99)
Potassium: 4.2 mEq/L (ref 3.5–5.1)
Sodium: 139 mEq/L (ref 135–145)
Total Protein: 6.6 g/dL (ref 6.0–8.3)

## 2018-07-09 LAB — LIPID PANEL
Cholesterol: 190 mg/dL (ref 0–200)
HDL: 50.3 mg/dL (ref >39.00)
NonHDL: 140.17
Total CHOL/HDL Ratio: 4
Triglycerides: 285 mg/dL — ABNORMAL HIGH (ref 0.0–149.0)
VLDL: 57 mg/dL — AB (ref 0.0–40.0)

## 2018-07-09 LAB — LDL CHOLESTEROL, DIRECT: Direct LDL: 124 mg/dL

## 2018-07-11 ENCOUNTER — Encounter: Payer: Self-pay | Admitting: Family Medicine

## 2018-07-24 ENCOUNTER — Other Ambulatory Visit: Payer: PRIVATE HEALTH INSURANCE

## 2018-07-30 ENCOUNTER — Ambulatory Visit: Payer: Self-pay | Admitting: Hematology

## 2018-07-30 NOTE — Telephone Encounter (Signed)
Patient states she does have asthma, but this cough is different.  It is dry and sometimes she is unable to stop coughing. / Fever of 101.3 this morning. Patient states she has only been taking a half dose of Robitussin because she only has a small bottle.  Reason for Disposition . Cough with cold symptoms (e.g., runny nose, postnasal drip, throat clearing)  Answer Assessment - Initial Assessment Questions 1. ONSET: "When did the cough begin?"      Three days ago 2. SEVERITY: "How bad is the cough today?"      bad 3. RESPIRATORY DISTRESS: "Describe your breathing."      Some shortness of breath 4. FEVER: "Do you have a fever?" If so, ask: "What is your temperature, how was it measured, and when did it start?"     Patient temp was 101.3 this morning 5. HEMOPTYSIS: "Are you coughing up any blood?" If so ask: "How much?" (flecks, streaks, tablespoons, etc.)     no 6. TREATMENT: "What have you done so far to treat the cough?" (e.g., meds, fluids, humidifier)    Patient states she has been taking robitussin DM.  Only taking a 1/2 dose because the bottle is small.  Has not taken anything for the fever 7. CARDIAC HISTORY: "Do you have any history of heart disease?" (e.g., heart attack, congestive heart failure)    no 8. LUNG HISTORY: "Do you have any history of lung disease?"  (e.g., pulmonary embolus, asthma, emphysema)     Asthma, pulmonary nodules 9. PE RISK FACTORS: "Do you have a history of blood clots?" (or: recent major surgery, recent prolonged travel, bedridden)     no 10. OTHER SYMPTOMS: "Do you have any other symptoms? (e.g., runny nose, wheezing, chest pain)     Runny nose, wheezing, fever 11. PREGNANCY: "Is there any chance you are pregnant?" "When was your last menstrual period?"       No, LMP 07/30/2018 12. TRAVEL: "Have you traveled out of the country in the last month?" (e.g., travel history, exposures)       no  Protocols used: COUGH - ACUTE NON-PRODUCTIVE-A-AH

## 2018-08-06 ENCOUNTER — Encounter (HOSPITAL_COMMUNITY): Payer: Self-pay | Admitting: Emergency Medicine

## 2018-08-06 ENCOUNTER — Other Ambulatory Visit: Payer: Self-pay

## 2018-08-06 ENCOUNTER — Telehealth: Payer: PRIVATE HEALTH INSURANCE | Admitting: Physician Assistant

## 2018-08-06 ENCOUNTER — Emergency Department (HOSPITAL_COMMUNITY)
Admission: EM | Admit: 2018-08-06 | Discharge: 2018-08-07 | Disposition: A | Discharge status: Home / Self Care | Payer: PRIVATE HEALTH INSURANCE | Attending: Emergency Medicine | Admitting: Emergency Medicine

## 2018-08-06 ENCOUNTER — Ambulatory Visit: Payer: Self-pay | Admitting: Family Medicine

## 2018-08-06 ENCOUNTER — Emergency Department (HOSPITAL_COMMUNITY): Payer: PRIVATE HEALTH INSURANCE

## 2018-08-06 DIAGNOSIS — R059 Cough, unspecified: Secondary | ICD-10-CM

## 2018-08-06 DIAGNOSIS — J45909 Unspecified asthma, uncomplicated: Secondary | ICD-10-CM | POA: Diagnosis not present

## 2018-08-06 DIAGNOSIS — R05 Cough: Secondary | ICD-10-CM

## 2018-08-06 DIAGNOSIS — M7989 Other specified soft tissue disorders: Secondary | ICD-10-CM

## 2018-08-06 DIAGNOSIS — B349 Viral infection, unspecified: Secondary | ICD-10-CM | POA: Insufficient documentation

## 2018-08-06 DIAGNOSIS — R0602 Shortness of breath: Secondary | ICD-10-CM

## 2018-08-06 DIAGNOSIS — Z20828 Contact with and (suspected) exposure to other viral communicable diseases: Secondary | ICD-10-CM | POA: Diagnosis not present

## 2018-08-06 DIAGNOSIS — Z79899 Other long term (current) drug therapy: Secondary | ICD-10-CM | POA: Diagnosis not present

## 2018-08-06 DIAGNOSIS — Z87891 Personal history of nicotine dependence: Secondary | ICD-10-CM | POA: Insufficient documentation

## 2018-08-06 DIAGNOSIS — H538 Other visual disturbances: Secondary | ICD-10-CM

## 2018-08-06 DIAGNOSIS — R509 Fever, unspecified: Secondary | ICD-10-CM

## 2018-08-06 LAB — INFLUENZA PANEL BY PCR (TYPE A & B)
Influenza A By PCR: NEGATIVE
Influenza B By PCR: NEGATIVE

## 2018-08-06 NOTE — Telephone Encounter (Signed)
[place] - [address]

## 2018-08-06 NOTE — Telephone Encounter (Signed)
Pt has been scheduled for webex.  

## 2018-08-06 NOTE — Progress Notes (Signed)
Virtual Visit via Video Note Post ED  Web visit  I connected with@ on 08/07/18 at 10:15 AM EDT by a video enabled telemedicine application and verified that I am speaking with the correct person using two identifiers.  Location patient: home Location provider:work or home office Persons participating in the virtual visit: patient, provider   limitations of evaluation and management by telemedicine and thelimited  availability of in person appointments. The patient expressed understanding .   HPI: Patient is WebEx appointment today and provider can visually see patient in audio however she can only see avatar and audio of me.  We proceeded with the appointment. She has had a problem with recurrent chronic cough treated as asthma and was treated with prednisone last month with some improvement however over the last number of weeks has had a coughing illness and more recently a low-grade fever off and on 100.6.  She contacted for ED visit because she felt she had blurry vision and then went to the emergency room yesterday evening because of ongoing severe cough history of fever.  No direct exposure to coronavirus COVID-19 however some at work or on a cruise recently uncertain where may be the [place].  She had a chest x-ray pulse ox normal vital signs in the ED.  COVID-19 screen was obtained.  Was told to isolate at home and felt clinically stable to be discharged. She lives at home with her husband who is not having symptoms. She works in [employer].  No recent travel.  Has been using over-the-counter Delsym not very helpful for coughing fits.  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  Was able to tolerate a codeine cough medicine without relapsing.  She used albuterol couple times a day without much help in the emergency room clinician advised not using it because it could bronchodilator and possibly make  COVID-19 infection symptoms worse. ROS: See pertinent positives and negatives per HPI. Hx of osa  Past Medical History:  Diagnosis Date  . Abnormal pap    w LEEP  . Alcoholism (HCC)   . Allergy   . Asthma   . Blood in stool   . Depression (emotion)   . Frequent headaches   . Hepatitis C, chronic (HCC)    hep c tx successful  . Irritable bowel syndrome   . OSA on CPAP - sees Dr. Jenne Campus in ENT 10/09/2013  . Seasonal allergies    scratch test in 2006///mold issue in house    Past Surgical History:  Procedure Laterality Date  . DILATION AND CURETTAGE OF UTERUS  1996, 2001, 2002   x 3  . LEEP  2008  . NASAL FRACTURE SURGERY  1999   repair  . NASAL SEPTOPLASTY W/ TURBINOPLASTY      Family History  Problem Relation Age of Onset  . Emphysema Mother   . Colon polyps Mother   . Diverticulitis Mother   . Heart disease Father   . Cancer Father        unsure??  . Hypertension Father   . Hepatitis C Father   . Stroke Maternal Grandmother   . Osteoporosis Maternal Grandmother     SOCIAL HX: ex to bacco  Remote livew with husband  No ets    Current Outpatient Medications:  .  albuterol (PROAIR HFA) 108 (90 Base) MCG/ACT inhaler, Inhale 2 puffs into the lungs every 6 (  six) hours as needed for wheezing or shortness of breath., Disp: 1 Inhaler, Rfl: 3 .  ARIPiprazole (ABILIFY) 15 MG tablet, Take 15 mg by mouth daily.  , Disp: , Rfl:  .  desvenlafaxine (PRISTIQ) 100 MG 24 hr tablet, Take 100 mg by mouth daily., Disp: , Rfl:  .  desvenlafaxine (PRISTIQ) 100 MG 24 hr tablet, Take 100 mg by mouth daily., Disp: , Rfl:  .  dicyclomine (BENTYL) 10 MG capsule, Take 1 capsule (10 mg total) by mouth every 8 (eight) hours as needed. (Patient taking differently: Take 10 mg by mouth every 8 (eight) hours as needed for spasms. ), Disp: 90 capsule, Rfl: 1 .  diphenoxylate-atropine (LOMOTIL) 2.5-0.025 MG tablet, Take 1 tablet by mouth 3 (three) times daily as needed for constipation., Disp: , Rfl:   .  KURVELO 0.15-30 MG-MCG tablet, TAKE 1 TABLET BY MOUTH EVERY DAY (Patient taking differently: Take 1 tablet by mouth daily. ), Disp: 84 tablet, Rfl: 2 .  Multiple Vitamin (MULTIVITAMINS PO), Take 1 tablet by mouth daily. , Disp: , Rfl:  .  ondansetron (ZOFRAN ODT) 8 MG disintegrating tablet, Take 1 tablet (8 mg total) by mouth every 8 (eight) hours as needed for nausea or vomiting., Disp: 30 tablet, Rfl: 3 .  cetirizine (ZYRTEC) 10 MG tablet, Take 10 mg by mouth daily., Disp: , Rfl:  .  HYDROcodone-homatropine (HYCODAN) 5-1.5 MG/5ML syrup, Take 5 mLs by mouth every 8 (eight) hours as needed for cough., Disp: 120 mL, Rfl: 0  EXAM:  VITALS per patient if applicable: GENERAL: alert, oriented, normal color and speech with intermittent coughing fits but sound dry and wheezy.  She does not appear dyspneic in between and has normal color.  Uncertain if dyspneic because of her coughing fits.  Her speech is unlabored.  HEENT: atraumatic, conjunttiva clear, no obvious abnormalities on inspection of external nose and ears  NECK: normal movements of the head and neck  LUNGS: on inspection no signs of respiratory distress, breathing rate appears normal, to slightly increased in between her coughing.  No obvious gross SOB, gasping or wheezing but possibly has increased respiratory rate.  CV: no obvious cyanosis  MS: moves all visible extremities without noticeable abnormality upper extremities appear normal.  PSYCH/NEURO: pleasant and cooperative, no obvious depression or anxiety, speech and thought processing grossly intact Emergency department visit evaluation reviewed. ASSESSMENT AND PLAN:  Discussed the following assessment and plan:  Febrile respiratory illness - see ed visit 3 24 coughing covid 19 testing pending  Mild intermittent asthma, unspecified whether complicated  Pulmonary nodules  Patient has continued coughing some shortness of breath underlying asthma see emergency room  visit  Okay to stay off the albuterol since it was not helping anyway but can be used if helpful. It was reassuring that she had a normal chest x-ray and pulse ox in the ED. Continue isolation COVID-19 testing pending She has had a baseline cough before this began and treatment for reactive airway asthmatic bronchitis a month or so ago. After improved from this illness would strongly recommend a follow-up with her PCP and/or pulmonary.  She has pulmonary nodules by history but that should not be clinically significant to her problems today.  Discussed comfort measures cough medications Delsym seems to not be helping risk-benefit of hydrocodone cough medicine for comfort at night discussed she feels she will tolerate it without the risk of relapse with her hx of etoh and sud.    Benefit more than risk of medications  to continue. She is coughing frequently  during visit today  Prescription sent in for cough medicine for comfort We will send message to Dr. Selena Batten and advise phone call recheck how she is doing before the weekend. I discussed the assessment and treatment plan with the patient. The patient was provided an opportunity to ask questions and all were answered. The patient agreed with the plan and demonstrated an understanding of the instructions.   The patient was advised to call back or seek an in-person evaluation if the symptoms worsen or if the condition fails to improve as anticipated.  I provided 20+  minutes of face-to-face time during this encounter.   Berniece Andreas, MD

## 2018-08-06 NOTE — ED Provider Notes (Signed)
[facility] EMERGENCY DEPARTMENT Provider Note   CSN: [id number] Arrival date & time: 08/06/18  2007    History   Chief Complaint Chief Complaint  Patient presents with  . Shortness of Breath  . Fever    HPI Nicole [name] is a 43 y.o. female with PMHx asthma who presents to the ED complaining of productive cough x 2 weeks, fever with Tmax 101.6 F x 1 week, and shortness of breath x 3 days. Pt has hx of asthma and has been using her albuterol inhaler twice daily without relief. She was evaluated by telemedicine today and told that if she is short of breath she needs to go to an ED for further evaluation. O2 saturation 97% on RA. No recent travel herself but she has been around a coworker who was on a cruise about 2 weeks ago. She also came into contact with a coworker who was having similar symptoms. No known exposure to COVID positive patient. Pt took an Aleve at 5 PM today and temp is 98.9 in ED. She also endorses blurry vision that occurred today while working on the computer at home as well as a headache mostly with coughing. She states she feels like she is unable to concentrate as well as she normally does.       Past Medical History:  Diagnosis Date  . Abnormal pap    w LEEP  . Alcoholism (HCC)   . Allergy   . Asthma   . Blood in stool   . Depression (emotion)   . Frequent headaches   . Hepatitis C, chronic (HCC)    hep c tx successful  . Irritable bowel syndrome   . OSA on CPAP - sees Dr. Jenne Campus in ENT 10/09/2013  . Seasonal allergies    scratch test in 2006///mold issue in house    Patient Active Problem List   Diagnosis Date Noted  . IBS (irritable bowel syndrome) 05/24/2016  . Chronic venous insufficiency 05/24/2016  . Migraine 05/24/2016  . Pulmonary nodules 10/27/2014  . Hepatitis C, chronic (HCC)   . Depression 10/09/2013  . OSA on CPAP - sees Dr. Jenne Campus in ENT 10/09/2013  . Allergic rhinitis 05/30/2012  . Cough variant asthma 02/08/2012     Past Surgical History:  Procedure Laterality Date  . DILATION AND CURETTAGE OF UTERUS  1996, 2001, 2002   x 3  . LEEP  2008  . NASAL FRACTURE SURGERY  1999   repair  . NASAL SEPTOPLASTY W/ TURBINOPLASTY       OB History    Gravida  3   Para      Term      Preterm      AB  3   Living  0     SAB      TAB  3   Ectopic      Multiple      Live Births               Home Medications    Prior to Admission medications   Medication Sig Start Date End Date Taking? Authorizing Provider  albuterol (PROAIR HFA) 108 (90 Base) MCG/ACT inhaler Inhale 2 puffs into the lungs every 6 (six) hours as needed for wheezing or shortness of breath. 04/19/17  Yes Kriste Basque R, DO  ARIPiprazole (ABILIFY) 15 MG tablet Take 15 mg by mouth daily.     Yes [provider]  cetirizine (ZYRTEC) 10 MG tablet Take 10 mg  by mouth daily.   Yes [provider]  desvenlafaxine (PRISTIQ) 100 MG 24 hr tablet Take 100 mg by mouth daily.   Yes [provider]  dicyclomine (BENTYL) 10 MG capsule Take 1 capsule (10 mg total) by mouth every 8 (eight) hours as needed. Patient taking differently: Take 10 mg by mouth every 8 (eight) hours as needed for spasms.  05/28/17  Yes Armbruster, Willaim Rayas, MD  diphenoxylate-atropine (LOMOTIL) 2.5-0.025 MG tablet Take 1 tablet by mouth 3 (three) times daily as needed for constipation. 06/20/18  Yes [provider]  KURVELO 0.15-30 MG-MCG tablet TAKE 1 TABLET BY MOUTH EVERY DAY Patient taking differently: Take 1 tablet by mouth daily.  10/16/17  Yes Terressa Koyanagi, DO  Multiple Vitamin (MULTIVITAMINS PO) Take 1 tablet by mouth daily.    Yes [provider]  ondansetron (ZOFRAN ODT) 8 MG disintegrating tablet Take 1 tablet (8 mg total) by mouth every 8 (eight) hours as needed for nausea or vomiting. 05/28/17  Yes Armbruster, Willaim Rayas, MD    Family History Family History  Problem Relation Age of Onset  . Emphysema Mother   .  Colon polyps Mother   . Diverticulitis Mother   . Heart disease Father   . Cancer Father        unsure??  . Hypertension Father   . Hepatitis C Father   . Stroke Maternal Grandmother   . Osteoporosis Maternal Grandmother     Social History Social History   Tobacco Use  . Smoking status: Former Smoker    Packs/day: 1.50    Years: 15.00    Pack years: 22.50    Types: Cigarettes    Last attempt to quit: 05/15/2005    Years since quitting: 13.2  . Smokeless tobacco: Never Used  . Tobacco comment: quit in 2007, smoked for 13 years   Substance Use Topics  . Alcohol use: No    Alcohol/week: 0.0 standard drinks    Comment: quit drinking on 2004---relapse in 2007---QUIT 07/2005  . Drug use: No    Comment: IV drug use///street drugs///inhalants  QUIT 07/2005     Allergies   Cashew nut oil; Other; Cefaclor; and Nickel   Review of Systems Review of Systems  Constitutional: Positive for fever. Negative for chills.  HENT: Negative for ear pain and sore throat.   Eyes: Positive for visual disturbance (blurry vision).  Respiratory: Positive for cough, chest tightness and shortness of breath. Negative for wheezing.   Cardiovascular: Negative for chest pain and leg swelling.  Gastrointestinal: Positive for diarrhea, nausea and vomiting (post tussive). Negative for abdominal pain.  Musculoskeletal: Positive for myalgias.  Skin: Negative for rash.  Neurological: Positive for headaches. Negative for dizziness and light-headedness.  Psychiatric/Behavioral: Positive for decreased concentration.     Physical Exam Updated Vital Signs BP 113/85   Pulse 89   Temp 98.9 F (37.2 C) (Oral)   Resp 14   Ht 5' (1.524 m)   Wt 94.3 kg   LMP 08/01/2018   SpO2 98%   BMI 40.62 kg/m   Physical Exam Vitals signs and nursing note reviewed.  Constitutional:      Appearance: She is well-developed. She is not ill-appearing or diaphoretic.  HENT:     Head: Normocephalic and atraumatic.  Eyes:      Conjunctiva/sclera: Conjunctivae normal.  Neck:     Musculoskeletal: Neck supple.  Cardiovascular:     Rate and Rhythm: Normal rate and regular rhythm.  Pulses: Normal pulses.  Pulmonary:     Effort: Pulmonary effort is normal.     Breath sounds: Normal breath sounds. No wheezing, rhonchi or rales.  Chest:     Chest wall: Tenderness present.  Abdominal:     Palpations: Abdomen is soft.     Tenderness: There is no abdominal tenderness.  Musculoskeletal:     Right lower leg: No edema.     Left lower leg: No edema.  Skin:    General: Skin is warm and dry.  Neurological:     Mental Status: She is alert.      ED Treatments / Results  Labs (all labs ordered are listed, but only abnormal results are displayed) Labs Reviewed  NOVEL CORONAVIRUS, NAA (HOSPITAL ORDER, SEND-OUT TO REF LAB)  INFLUENZA PANEL BY PCR (TYPE A & B)    EKG EKG Interpretation  Date/Time:  Tuesday August 06 2018 20:19:23 EDT Ventricular Rate:  82 PR Interval:    QRS Duration: 100 QT Interval:  356 QTC Calculation: 416 R Axis:   64 Text Interpretation:  Sinus rhythm Short PR interval Low voltage, precordial leads Confirmed by Virgina Norfolk [phone]) on 08/06/2018 8:32:27 PM   Radiology Dg Chest Port 1 View  Result Date: 08/06/2018 CLINICAL DATA:  Dyspnea and cough EXAM: PORTABLE CHEST 1 VIEW COMPARISON:  Chest CT 06/26/2017 and CXR 02/08/2012 FINDINGS: The heart size and mediastinal contours are within normal limits. Both lungs are clear. Nodules seen to best advantage on prior CT are not apparent radiographically. The visualized skeletal structures are unremarkable. IMPRESSION: No active disease. Electronically Signed   By: Tollie Eth M.D.   On: 08/06/2018 21:37    Procedures Procedures (including critical care time)  Medications Ordered in ED Medications - No data to display   Initial Impression / Assessment and Plan / ED Course  I have reviewed the triage vital signs and the nursing  notes.  Pertinent labs & imaging results that were available during my care of the patient were reviewed by me and considered in my medical decision making (see chart for details).    Pt presents with cough, SOB, and fever with tmax 101.6. Afebrile in the ED as she took Aleve 3 hours PTA. She had telemedicine eval today and was told if she is feeling SOB she needs to go to an ED for further evaluation. No known positive contact with COVID pt. She has been in contact with individual who was on cruise and another individual who had similar symptoms prior to her feeling ill. No recent travel herself.   Nursing staff informed me that while triaging patient he had a hard stop pop up on his end that stated that pt may have come into contact with someone who tested positive for COVID. He is unable to reproduce the same message. Spoke with Infection Prevention who is unaware of popup. Unsure if this pop up is present due to pt having telemedicine eval earlier today for COVID like symptoms. Given presentation and positive contact with person who was on a cruise/person who had similar symptoms will get portable CXR and flu swab prior to initiating COVID testing. Pt satting well on room air at 97%. Lungs clear to auscultation bilaterally. She is in agreement with plan at this time. Will discuss findings with her over room phone.   11:27 PM CXR negative. Flu negative. Will get COVID swab considering exposure to pt who traveled to high risk area (cruise). Filled out [facility] paperwork and  had pt sign visitor log. Pt in agreement to self isolate at home for now until test results return and to quarantine further if it is positive. She is stable for discharge at this time.       Final Clinical Impressions(s) / ED Diagnoses   Final diagnoses:  Viral illness  Cough  Shortness of breath    ED Discharge Orders    None       Tanda RockersVenter, Williard Keller, PA-C 08/07/18 0049    Virgina Norfolkuratolo, Adam, DO 08/08/18  [phone]

## 2018-08-06 NOTE — Progress Notes (Signed)
3.0 E-Visit for Corona Virus Screening  Based on what you have shared with me, you need to seek an evaluation for a severe illness that is causing your symptoms which may be coronavirus or some other illness. I recommend that you be seen and evaluated "face to face". Our Emergency Departments are best equipped to handle patients with severe symptoms.   I recommend the following:  . If you are having a true medical emergency please call 911. . If you are considered high risk for Corona virus because of a known exposure, fever, shortness of breath and cough, OR if you have severe symptoms of any kind, seek medical care at an emergency room.  . Please call ahead and tell them that you were seen by telemedicine and they have recommended that you have a face to face evaluation. . [facility] Emergency Department [address], [place], [place] [address] [phone]  . [facility] [place] Emergency Department [address], [place], [place] [address] [phone]  . [facility] Emergency Department [address], [place], [place] [address] [phone]  . [facility] Emergency Department [address], [place], [place] [address] [phone]  . [facility] Emergency Department [address], [place], [place] [address] [phone]  NOTE: If you entered your credit card information for this eVisit, you will not be charged. You may see a "hold" on your card for the $35 but that hold will drop off and you will not have a charge processed.   Your e-visit answers were reviewed by a board certified advanced clinical practitioner to complete your personal care plan.  Thank you for using e-Visits.  

## 2018-08-06 NOTE — Discharge Instructions (Addendum)
You were seen today for cough, fever, and shortness of breath Your CXR and flu test were both negative. We have ordered a COVID test which can take a couple of days to get back; the lab will call you if it is positive Please quarantine at home now until you receive test results; if you are positive you need to remain at home until you are symptom free for > 72 hours     Person Under Monitoring Name: Nicole [name]  Location: [address] [place] [address]   Infection Prevention Recommendations for Individuals Confirmed to have, or Being Evaluated for, 2019 Novel Coronavirus (COVID-19) Infection Who Receive Care at Home  Individuals who are confirmed to have, or are being evaluated for, COVID-19 should follow the prevention steps below until a healthcare provider or local or state health department says they can return to normal activities.  Stay home except to get medical care You should restrict activities outside your home, except for getting medical care. Do not go to work, school, or public areas, and do not use public transportation or taxis.  Call ahead before visiting your doctor Before your medical appointment, call the healthcare provider and tell them that you have, or are being evaluated for, COVID-19 infection. This will help the healthcare providers office take steps to keep other people from getting infected. Ask your healthcare provider to call the local or state health department.  Monitor your symptoms Seek prompt medical attention if your illness is worsening (e.g., difficulty breathing). Before going to your medical appointment, call the healthcare provider and tell them that you have, or are being evaluated for, COVID-19 infection. Ask your healthcare provider to call the local or state health department.  Wear a facemask You should wear a facemask that covers your nose and mouth when you are in the same room with other people and when you visit a  healthcare provider. People who live with or visit you should also wear a facemask while they are in the same room with you.  Separate yourself from other people in your home As much as possible, you should stay in a different room from other people in your home. Also, you should use a separate bathroom, if available.  Avoid sharing household items You should not share dishes, drinking glasses, cups, eating utensils, towels, bedding, or other items with other people in your home. After using these items, you should wash them thoroughly with soap and water.  Cover your coughs and sneezes Cover your mouth and nose with a tissue when you cough or sneeze, or you can cough or sneeze into your sleeve. Throw used tissues in a lined trash can, and immediately wash your hands with soap and water for at least 20 seconds or use an alcohol-based hand rub.  Wash your [employer] your hands often and thoroughly with soap and water for at least 20 seconds. You can use an alcohol-based hand sanitizer if soap and water are not available and if your hands are not visibly dirty. Avoid touching your eyes, nose, and mouth with unwashed hands.   Prevention Steps for Caregivers and Household Members of Individuals Confirmed to have, or Being Evaluated for, COVID-19 Infection Being Cared for in the Home  If you live with, or provide care at home for, a person confirmed to have, or being evaluated for, COVID-19 infection please follow these guidelines to prevent infection:  Follow healthcare providers instructions Make sure that you understand and can  help the patient follow any healthcare provider instructions for all care.  Provide for the patients basic needs You should help the patient with basic needs in the home and provide support for getting groceries, prescriptions, and other personal needs.  Monitor the patients symptoms If they are getting sicker, call his or her medical provider and tell  them that the patient has, or is being evaluated for, COVID-19 infection. This will help the healthcare providers office take steps to keep other people from getting infected. Ask the healthcare provider to call the local or state health department.  Limit the number of people who have contact with the patient If possible, have only one caregiver for the patient. Other household members should stay in another home or place of residence. If this is not possible, they should stay in another room, or be separated from the patient as much as possible. Use a separate bathroom, if available. Restrict visitors who do not have an essential need to be in the home.  Keep older adults, very young children, and other sick people away from the patient Keep older adults, very young children, and those who have compromised immune systems or chronic health conditions away from the patient. This includes people with chronic heart, lung, or kidney conditions, diabetes, and cancer.  Ensure good ventilation Make sure that shared spaces in the home have good air flow, such as from an air conditioner or an opened window, weather permitting.  Wash your hands often Wash your hands often and thoroughly with soap and water for at least 20 seconds. You can use an alcohol based hand sanitizer if soap and water are not available and if your hands are not visibly dirty. Avoid touching your eyes, nose, and mouth with unwashed hands. Use disposable paper towels to dry your hands. If not available, use dedicated cloth towels and replace them when they become wet.  Wear a facemask and gloves Wear a disposable facemask at all times in the room and gloves when you touch or have contact with the patients blood, body fluids, and/or secretions or excretions, such as sweat, saliva, sputum, nasal mucus, vomit, urine, or feces.  Ensure the mask fits over your nose and mouth tightly, and do not touch it during use. Throw out  disposable facemasks and gloves after using them. Do not reuse. Wash your hands immediately after removing your facemask and gloves. If your personal clothing becomes contaminated, carefully remove clothing and launder. Wash your hands after handling contaminated clothing. Place all used disposable facemasks, gloves, and other waste in a lined container before disposing them with other household waste. Remove gloves and wash your hands immediately after handling these items.  Do not share dishes, glasses, or other household items with the patient Avoid sharing household items. You should not share dishes, drinking glasses, cups, eating utensils, towels, bedding, or other items with a patient who is confirmed to have, or being evaluated for, COVID-19 infection. After the person uses these items, you should wash them thoroughly with soap and water.  Wash laundry thoroughly Immediately remove and wash clothes or bedding that have blood, body fluids, and/or secretions or excretions, such as sweat, saliva, sputum, nasal mucus, vomit, urine, or feces, on them. Wear gloves when handling laundry from the patient. Read and follow directions on labels of laundry or clothing items and detergent. In general, wash and dry with the warmest temperatures recommended on the label.  Clean all areas the individual has used often Clean all touchable  surfaces, such as counters, tabletops, doorknobs, bathroom fixtures, toilets, phones, keyboards, tablets, and bedside tables, every day. Also, clean any surfaces that may have blood, body fluids, and/or secretions or excretions on them. Wear gloves when cleaning surfaces the patient has come in contact with. Use a diluted bleach solution (e.g., dilute bleach with 1 part bleach and 10 parts water) or a household disinfectant with a label that says EPA-registered for coronaviruses. To make a bleach solution at home, add 1 tablespoon of bleach to 1 quart (4 cups) of water.  For a larger supply, add  cup of bleach to 1 gallon (16 cups) of water. Read labels of cleaning products and follow recommendations provided on product labels. Labels contain instructions for safe and effective use of the cleaning product including precautions you should take when applying the product, such as wearing gloves or eye protection and making sure you have good ventilation during use of the product. Remove gloves and wash hands immediately after cleaning.  Monitor yourself for signs and symptoms of illness Caregivers and household members are considered close contacts, should monitor their health, and will be asked to limit movement outside of the home to the extent possible. Follow the monitoring steps for close contacts listed on the symptom monitoring form.   ? If you have additional questions, contact your local health department or call the epidemiologist on call at [phone] (available 24/7). ? This guidance is subject to change. For the most up-to-date guidance from [facility], please refer to their website: [email or web address]

## 2018-08-06 NOTE — ED Triage Notes (Signed)
Pt c/o SOB, cough, fever at home. Reports fever at home of 101.55F and took Aleve at home around 1600. Cough going on for "a couple weeks". SOB developing about 3 days ago with incr'd use of albuterol inhaler at home. Denies travel, but reports sharing office with co-worker having sinus congestion, cough,etc. Hx asthma.

## 2018-08-06 NOTE — Telephone Encounter (Signed)
  Reason for Disposition . [1] Continuous (nonstop) coughing interferes with work or school AND [2] no improvement using cough treatment per protocol  Protocols used: COUGH - ACUTE NON-PRODUCTIVE-A-AH

## 2018-08-06 NOTE — Telephone Encounter (Signed)
  Pt. Reports still coughing, non-productive. Sore from coughing. Still running low grade fever - 100.7 this morning. Using ProAir, "but not helping as much." Using Delsym for cough. Would be interested in a virtual visit. Please advise. Contact number [phone].  Answer Assessment - Initial Assessment Questions 1. ONSET: "When did the cough begin?"      Last week 2. SEVERITY: "How bad is the cough today?"      Slightly worse 3. RESPIRATORY DISTRESS: "Describe your breathing."      No distress 4. FEVER: "Do you have a fever?" If so, ask: "What is your temperature, how was it measured, and when did it start?"     Low grade fever 100.4 5. HEMOPTYSIS: "Are you coughing up any blood?" If so ask: "How much?" (flecks, streaks, tablespoons, etc.)     No 6. TREATMENT: "What have you done so far to treat the cough?" (e.g., meds, fluids, humidifier)     Inhaler - Pro Air 7. CARDIAC HISTORY: "Do you have any history of heart disease?" (e.g., heart attack, congestive heart failure)      No 8. LUNG HISTORY: "Do you have any history of lung disease?"  (e.g., pulmonary embolus, asthma, emphysema)     Asthma 9. PE RISK FACTORS: "Do you have a history of blood clots?" (or: recent major surgery, recent prolonged travel, bedridden)     No 10. OTHER SYMPTOMS: "Do you have any other symptoms? (e.g., runny nose, wheezing, chest pain)       Wheezing 11. PREGNANCY: "Is there any chance you are pregnant?" "When was your last menstrual period?"       No 12. TRAVEL: "Have you traveled out of the country in the last month?" (e.g., travel history, exposures)       No  Protocols used: COUGH - ACUTE NON-PRODUCTIVE-A-AH

## 2018-08-06 NOTE — Telephone Encounter (Signed)
PEC NT to call to schedule pt.

## 2018-08-07 ENCOUNTER — Encounter: Payer: Self-pay | Admitting: Internal Medicine

## 2018-08-07 ENCOUNTER — Ambulatory Visit (INDEPENDENT_AMBULATORY_CARE_PROVIDER_SITE_OTHER): Payer: PRIVATE HEALTH INSURANCE | Admitting: Internal Medicine

## 2018-08-07 DIAGNOSIS — J4520 Mild intermittent asthma, uncomplicated: Secondary | ICD-10-CM

## 2018-08-07 DIAGNOSIS — R509 Fever, unspecified: Secondary | ICD-10-CM

## 2018-08-07 DIAGNOSIS — J989 Respiratory disorder, unspecified: Secondary | ICD-10-CM

## 2018-08-07 DIAGNOSIS — R911 Solitary pulmonary nodule: Secondary | ICD-10-CM | POA: Diagnosis not present

## 2018-08-07 MED ORDER — HYDROCODONE-HOMATROPINE 5-1.5 MG/5ML PO SYRP: 5.0000 mL | ORAL_SOLUTION | Freq: Three times a day (TID) | ORAL | 0 refills | Status: DC | PRN

## 2018-08-07 NOTE — Progress Notes (Signed)
Ronnald Collum can you check on her daily by phone to see how she is doing - if develops worsening or struggling to breath should go back to ER. Thank you.

## 2018-08-07 NOTE — ED Notes (Signed)
Reviewed d/c instructions with pt, who verbalized understanding and had no outstanding questions. Reviewed again quarantine guidelines and reporting procedures. CDC forms completed by provider and given to NS to be faxed and recorded. Pt departed in NAD, refused use of wheelchair.

## 2018-08-08 NOTE — Progress Notes (Signed)
I called the pt and she stated the shortness of breath is slightly better today and the cough medication has helped.  Patient complains of aches, temp of 101.9 degrees  (taken while on the phone) at this time.  I advised the pt to eat something, take Tylenol and increase fluid intake.  Patient also advised to go back to the ER if breathing problems worsen and she agreed.

## 2018-08-15 ENCOUNTER — Telehealth: Payer: Self-pay | Admitting: Family Medicine

## 2018-08-15 ENCOUNTER — Encounter: Payer: Self-pay | Admitting: Adult Health

## 2018-08-15 ENCOUNTER — Other Ambulatory Visit: Payer: Self-pay

## 2018-08-15 ENCOUNTER — Ambulatory Visit (INDEPENDENT_AMBULATORY_CARE_PROVIDER_SITE_OTHER): Payer: PRIVATE HEALTH INSURANCE | Admitting: Adult Health

## 2018-08-15 ENCOUNTER — Telehealth: Payer: Self-pay | Admitting: *Deleted

## 2018-08-15 DIAGNOSIS — J069 Acute upper respiratory infection, unspecified: Secondary | ICD-10-CM

## 2018-08-15 LAB — NOVEL CORONAVIRUS, NAA (HOSP ORDER, SEND-OUT TO REF LAB; TAT 18-24 HRS): SARS-CoV-2, NAA: NOT DETECTED

## 2018-08-15 MED ORDER — PREDNISONE 50 MG PO TABS: ORAL_TABLET | ORAL | 0 refills | Status: DC

## 2018-08-15 MED ORDER — AZITHROMYCIN 250 MG PO TABS: ORAL_TABLET | ORAL | 0 refills | Status: DC

## 2018-08-15 NOTE — Telephone Encounter (Signed)
I called the pt and she still complains of a non-productive cough and shortness of breath which is better today.  States her temperature was 99.5 yesterday at 4pm and 101.2 last night.  States temp currently 101 and the last dose of Tylenol (took 4-500mg tablets) was taken yesterday at lunch time. Patient stated she did not want to hurt her body by taking a lot of Tylenol.  Message sent to Dr Kim (see office note). 

## 2018-08-15 NOTE — Telephone Encounter (Signed)
Copied from CRM [id number]. Topic: Quick Communication - See Telephone Encounter >> Aug 15, 2018  4:35 PM Jens Som A wrote: CRM for notification. See Telephone encounter for: 08/15/18. Arnethia  from [id number] - [place], [place] - [address] AT [facility] [address] [facility] [address] [phone] (Phone) [phone] (Fax)  Is calling to ask what is the sig for predniSONE (DELTASONE) 50 MG tablet [[id number]] . How many pills to take daily? Please advise.

## 2018-08-15 NOTE — Telephone Encounter (Signed)
I called the pt and scheduled an appt for today at 3:30pm with [facility]. Message sent to [facility] to arrange Webex.

## 2018-08-15 NOTE — Progress Notes (Signed)
Virtual Visit via Video Note  I connected with Nicole [name] on 08/15/18 at  3:30 PM EDT by a video enabled telemedicine application and verified that I am speaking with the correct person using two identifiers.  Location patient: home Location provider:work or home office Persons participating in the virtual visit: patient, provider  I discussed the limitations of evaluation and management by telemedicine and the availability of in person appointments. The patient expressed understanding and agreed to proceed.   HPI:  43 year old female who is being evaluated today for continued back to URI.   Was originally seen on 08/06/2018 in the emergency room complaining of productive cough x2 weeks, fever to 101.6 x 1 week, shortness of breath x3 days.  She has a history of asthma and has been using her albuterol inhaler twice a day without relief.  She was evaluated by telemedicine earlier that day and was told that if she had shortness of breath she needed to go to the emergency room for evaluation.  In the ER her O2 saturation was 7% on room air.  Chest x-ray was negative as well as her flu swab.  She was swab for Covid 19 recently came back negative.  Today she continues to complain of shortness of breath which some days is better but then some days is worse than when she was originally seen in the emergency room, a nonproductive cough, feeling of chest tightness in her upper chest, and fevers.  She reports her fever was 101 this morning is now 99.5.  Shortness of breath is both with exertion and at rest but worse with exertion.  She also endorses wheezing.  Her cough is better controlled with Hydromet that was prescribed on 08/06/2018 by another provider.   Has been using Tylenol at home but has not taken any today as well as her albuterol inhaler she does not find beneficial  ROS: See pertinent positives and negatives per HPI.  Past Medical History:  Diagnosis Date  . Abnormal pap    w LEEP  .  Alcoholism (HCC)   . Allergy   . Asthma   . Blood in stool   . Depression (emotion)   . Frequent headaches   . Hepatitis C, chronic (HCC)    hep c tx successful  . Irritable bowel syndrome   . OSA on CPAP - sees Dr. Jenne Campus in ENT 10/09/2013  . Seasonal allergies    scratch test in 2006///mold issue in house    Past Surgical History:  Procedure Laterality Date  . DILATION AND CURETTAGE OF UTERUS  1996, 2001, 2002   x 3  . LEEP  2008  . NASAL FRACTURE SURGERY  1999   repair  . NASAL SEPTOPLASTY W/ TURBINOPLASTY      Family History  Problem Relation Age of Onset  . Emphysema Mother   . Colon polyps Mother   . Diverticulitis Mother   . Heart disease Father   . Cancer Father        unsure??  . Hypertension Father   . Hepatitis C Father   . Stroke Maternal Grandmother   . Osteoporosis Maternal Grandmother       Current Outpatient Medications:  .  albuterol (PROAIR HFA) 108 (90 Base) MCG/ACT inhaler, Inhale 2 puffs into the lungs every 6 (six) hours as needed for wheezing or shortness of breath., Disp: 1 Inhaler, Rfl: 3 .  ARIPiprazole (ABILIFY) 15 MG tablet, Take 15 mg by mouth daily.  , Disp: , Rfl:  .  azithromycin (ZITHROMAX Z-PAK) 250 MG tablet, Take 2 tablets on Day 1.  Then take 1 tablet daily., Disp: 6 tablet, Rfl: 0 .  cetirizine (ZYRTEC) 10 MG tablet, Take 10 mg by mouth daily., Disp: , Rfl:  .  desvenlafaxine (PRISTIQ) 100 MG 24 hr tablet, Take 100 mg by mouth daily., Disp: , Rfl:  .  desvenlafaxine (PRISTIQ) 100 MG 24 hr tablet, Take 100 mg by mouth daily., Disp: , Rfl:  .  dicyclomine (BENTYL) 10 MG capsule, Take 1 capsule (10 mg total) by mouth every 8 (eight) hours as needed. (Patient taking differently: Take 10 mg by mouth every 8 (eight) hours as needed for spasms. ), Disp: 90 capsule, Rfl: 1 .  diphenoxylate-atropine (LOMOTIL) 2.5-0.025 MG tablet, Take 1 tablet by mouth 3 (three) times daily as needed for constipation., Disp: , Rfl:  .   HYDROcodone-homatropine (HYCODAN) 5-1.5 MG/5ML syrup, Take 5 mLs by mouth every 8 (eight) hours as needed for cough., Disp: 120 mL, Rfl: 0 .  KURVELO 0.15-30 MG-MCG tablet, TAKE 1 TABLET BY MOUTH EVERY DAY (Patient taking differently: Take 1 tablet by mouth daily. ), Disp: 84 tablet, Rfl: 2 .  Multiple Vitamin (MULTIVITAMINS PO), Take 1 tablet by mouth daily. , Disp: , Rfl:  .  ondansetron (ZOFRAN ODT) 8 MG disintegrating tablet, Take 1 tablet (8 mg total) by mouth every 8 (eight) hours as needed for nausea or vomiting., Disp: 30 tablet, Rfl: 3 .  predniSONE (DELTASONE) 50 MG tablet, Take daily, Disp: 10 tablet, Rfl: 0  EXAM:  VITALS per patient if applicable:  GENERAL: alert, oriented, appears well and in no acute distress  HEENT: atraumatic, conjunttiva clear, no obvious abnormalities on inspection of external nose and ears  NECK: normal movements of the head and neck  LUNGS: on inspection no signs of respiratory distress, breathing rate appears normal. She does have some obvious SOB while at rest and with talking. No obvious gasping or wheezing  CV: no obvious cyanosis  MS: moves all visible extremities without noticeable abnormality  PSYCH/NEURO: pleasant and cooperative, no obvious depression or anxiety, speech and thought processing grossly intact  ASSESSMENT AND PLAN:  Discussed the following assessment and plan:  Upper respiratory tract infection, unspecified type - Plan: predniSONE (DELTASONE) 50 MG tablet, azithromycin (ZITHROMAX Z-PAK) 250 MG tablet  Possible asthma exacerbation or bronchitis.  Continued fever is concern for possible pneumonia.  Will treat with prednisone 50 mg daily x10 days and azithromycin.  She was advised to follow-up with PCP if no improvement in the next 2 to 3 days or sooner if symptoms worsen.   I discussed the assessment and treatment plan with the patient. The patient was provided an opportunity to ask questions and all were answered. The patient  agreed with the plan and demonstrated an understanding of the instructions.   The patient was advised to call back or seek an in-person evaluation if the symptoms worsen or if the condition fails to improve as anticipated.   Shirline Frees, NP

## 2018-08-15 NOTE — Telephone Encounter (Signed)
Needs webex today. Either with me or another provider if I am full. Happy to work later - but I know staff has to leave at 4 - so ok to add to one of our other providers if they have openings and are able? Thanks!!!

## 2018-08-15 NOTE — Progress Notes (Signed)
I left a detailed message stating I was calling to see how the patient was feeling today.  I asked that she call back to let us know.

## 2018-08-15 NOTE — Progress Notes (Addendum)
I called the pt and she still complains of a non-productive cough and shortness of breath which is better today.  States her temperature was 99.5 yesterday at 4pm and 101.2 last night.  States temp currently 101 and the last dose of Tylenol (took 4-500mg  tablets) was taken yesterday at lunch time. Patient stated she did not want to hurt her body by taking a lot of Tylenol.  Message sent to Dr Selena Batten (see office note).

## 2018-08-16 NOTE — Telephone Encounter (Signed)
One pill daily

## 2018-08-16 NOTE — Telephone Encounter (Signed)
I called the pharmacy and informed Nicole [name] of the message below.

## 2018-08-20 ENCOUNTER — Encounter: Payer: Self-pay | Admitting: Adult Health

## 2018-09-01 ENCOUNTER — Ambulatory Visit (HOSPITAL_COMMUNITY)
Admission: EM | Admit: 2018-09-01 | Discharge: 2018-09-01 | Disposition: A | Discharge status: Home / Self Care | Payer: PRIVATE HEALTH INSURANCE | Attending: Family Medicine | Admitting: Family Medicine

## 2018-09-01 ENCOUNTER — Encounter (HOSPITAL_COMMUNITY): Payer: Self-pay | Admitting: Emergency Medicine

## 2018-09-01 ENCOUNTER — Other Ambulatory Visit: Payer: Self-pay

## 2018-09-01 DIAGNOSIS — N390 Urinary tract infection, site not specified: Secondary | ICD-10-CM | POA: Diagnosis not present

## 2018-09-01 LAB — POCT URINALYSIS DIP (DEVICE)
Bilirubin Urine: NEGATIVE
Glucose, UA: 100 mg/dL — AB
Ketones, ur: NEGATIVE mg/dL
Nitrite: POSITIVE — AB
Protein, ur: 100 mg/dL — AB
Specific Gravity, Urine: 1.025 (ref 1.005–1.030)
Urobilinogen, UA: 0.2 mg/dL (ref 0.0–1.0)
pH: 6 (ref 5.0–8.0)

## 2018-09-01 MED ORDER — NITROFURANTOIN MONOHYD MACRO 100 MG PO CAPS: 100.0000 mg | ORAL_CAPSULE | Freq: Two times a day (BID) | ORAL | 0 refills | Status: AC

## 2018-09-01 NOTE — Discharge Instructions (Addendum)
Urine showed evidence of infection. We are treating you with macrobid- twice daily for 5 days. Be sure to take full course. Stay hydrated- urine should be pale yellow to clear.   Please return or follow up here or with your primary provider if symptoms not improving with treatment. Please return sooner if you have worsening of symptoms or develop fever, nausea, vomiting, abdominal pain, back pain, lightheadedness, dizziness.

## 2018-09-01 NOTE — ED Provider Notes (Signed)
[facility]    CSN: [id number] Arrival date & time: 09/01/18  1102     History   Chief Complaint Chief Complaint  Patient presents with  . Hematuria    HPI Nicole [name] is a 43 y.o. female history of IBS, GERD, presenting today for evaluation of hematuria.  Patient states that on Friday she started developed discomfort in her bilateral lower back.  Since this has improved.  She is also noticed increased frequency and dysuria with urination.  Over the past 24 hours she is developed significant hematuria.  She denies any fevers.  Has had some mild nausea.  Denies vomiting.  Denies abdominal pain.  She notes that she recently finished a course of prednisone.  Has had a couple UTIs in her lifetime, but no recurrent history.  Denies history of kidney stones.  HPI  Past Medical History:  Diagnosis Date  . Abnormal pap    w LEEP  . Alcoholism (HCC)   . Allergy   . Asthma   . Blood in stool   . Depression (emotion)   . Frequent headaches   . Hepatitis C, chronic (HCC)    hep c tx successful  . Irritable bowel syndrome   . OSA on CPAP - sees Dr. Jenne CampusMcQueen in ENT 10/09/2013  . Seasonal allergies    scratch test in 2006///mold issue in house    Patient Active Problem List   Diagnosis Date Noted  . IBS (irritable bowel syndrome) 05/24/2016  . Chronic venous insufficiency 05/24/2016  . Migraine 05/24/2016  . Pulmonary nodules 10/27/2014  . Hepatitis C, chronic (HCC)   . Depression 10/09/2013  . OSA on CPAP - sees Dr. Jenne CampusMcQueen in ENT 10/09/2013  . Allergic rhinitis 05/30/2012  . Cough variant asthma 02/08/2012    Past Surgical History:  Procedure Laterality Date  . DILATION AND CURETTAGE OF UTERUS  1996, 2001, 2002   x 3  . LEEP  2008  . NASAL FRACTURE SURGERY  1999   repair  . NASAL SEPTOPLASTY W/ TURBINOPLASTY      OB History    Gravida  3   Para      Term      Preterm      AB  3   Living  0     SAB      TAB  3   Ectopic      Multiple      Live Births               Home Medications    Prior to Admission medications   Medication Sig Start Date End Date Taking? Authorizing Provider  albuterol (PROAIR HFA) 108 (90 Base) MCG/ACT inhaler Inhale 2 puffs into the lungs every 6 (six) hours as needed for wheezing or shortness of breath. 04/19/17   Terressa KoyanagiKim, Hannah R, DO  ARIPiprazole (ABILIFY) 15 MG tablet Take 15 mg by mouth daily.      [provider]  azithromycin (ZITHROMAX Z-PAK) 250 MG tablet Take 2 tablets on Day 1.  Then take 1 tablet daily. 08/15/18   Nafziger, Kandee Keenory, NP  cetirizine (ZYRTEC) 10 MG tablet Take 10 mg by mouth daily.    [provider]  desvenlafaxine (PRISTIQ) 100 MG 24 hr tablet Take 100 mg by mouth daily.    [provider]  desvenlafaxine (PRISTIQ) 100 MG 24 hr tablet Take 100 mg by mouth daily.    [provider]  dicyclomine (BENTYL) 10 MG capsule Take 1  capsule (10 mg total) by mouth every 8 (eight) hours as needed. Patient taking differently: Take 10 mg by mouth every 8 (eight) hours as needed for spasms.  05/28/17   Armbruster, Willaim Rayas, MD  diphenoxylate-atropine (LOMOTIL) 2.5-0.025 MG tablet Take 1 tablet by mouth 3 (three) times daily as needed for constipation. 06/20/18   [provider]  HYDROcodone-homatropine (HYCODAN) 5-1.5 MG/5ML syrup Take 5 mLs by mouth every 8 (eight) hours as needed for cough. 08/07/18   Panosh, Neta Mends, MD  KURVELO 0.15-30 MG-MCG tablet TAKE 1 TABLET BY MOUTH EVERY DAY Patient taking differently: Take 1 tablet by mouth daily.  10/16/17   Terressa Koyanagi, DO  Multiple Vitamin (MULTIVITAMINS PO) Take 1 tablet by mouth daily.     [provider]  nitrofurantoin, macrocrystal-monohydrate, (MACROBID) 100 MG capsule Take 1 capsule (100 mg total) by mouth 2 (two) times daily for 5 days. 09/01/18 09/06/18  ,  C, PA-C  ondansetron (ZOFRAN ODT) 8 MG disintegrating tablet Take 1 tablet (8 mg total) by mouth every 8 (eight) hours as  needed for nausea or vomiting. 05/28/17   Benancio Deeds, MD  predniSONE (DELTASONE) 50 MG tablet Take daily Patient not taking: Reported on 09/01/2018 08/15/18   Shirline Frees, NP    Family History Family History  Problem Relation Age of Onset  . Emphysema Mother   . Colon polyps Mother   . Diverticulitis Mother   . Heart disease Father   . Cancer Father        unsure??  . Hypertension Father   . Hepatitis C Father   . Stroke Maternal Grandmother   . Osteoporosis Maternal Grandmother     Social History Social History   Tobacco Use  . Smoking status: Former Smoker    Packs/day: 1.50    Years: 15.00    Pack years: 22.50    Types: Cigarettes    Last attempt to quit: 05/15/2005    Years since quitting: 13.3  . Smokeless tobacco: Never Used  . Tobacco comment: quit in 2007, smoked for 13 years   Substance Use Topics  . Alcohol use: No    Alcohol/week: 0.0 standard drinks    Comment: quit drinking on 2004---relapse in 2007---QUIT 07/2005  . Drug use: No    Comment: IV drug use///street drugs///inhalants  QUIT 07/2005     Allergies   Cashew nut oil; Other; Cefaclor; and Nickel   Review of Systems Review of Systems  Constitutional: Negative for fever.  Respiratory: Negative for shortness of breath.   Cardiovascular: Negative for chest pain.  Gastrointestinal: Positive for nausea. Negative for abdominal pain, diarrhea and vomiting.  Genitourinary: Positive for dysuria, frequency and urgency. Negative for flank pain, genital sores, hematuria, menstrual problem, vaginal bleeding, vaginal discharge and vaginal pain.  Musculoskeletal: Negative for back pain.  Skin: Negative for rash.  Neurological: Negative for dizziness, light-headedness and headaches.     Physical Exam Triage Vital Signs ED Triage Vitals  Enc Vitals Group     BP 09/01/18 1131 122/67     Pulse Rate 09/01/18 1131 (!) 105     Resp 09/01/18 1131 18     Temp 09/01/18 1131 98.2 F (36.8 C)     Temp  Source 09/01/18 1131 Oral     SpO2 09/01/18 1131 95 %     Weight --      Height --      Head Circumference --      Peak Flow --  Pain Score 09/01/18 1132 5     Pain Loc --      Pain Edu? --      Excl. in GC? --    No data found.  Updated Vital Signs BP 122/67 (BP Location: Left Arm)   Pulse (!) 105   Temp 98.2 F (36.8 C) (Oral)   Resp 18   SpO2 95%   Visual Acuity Right Eye Distance:   Left Eye Distance:   Bilateral Distance:    Right Eye Near:   Left Eye Near:    Bilateral Near:     Physical Exam Vitals signs and nursing note reviewed.  Constitutional:      General: She is not in acute distress.    Appearance: She is well-developed.  HENT:     Head: Normocephalic and atraumatic.  Eyes:     Conjunctiva/sclera: Conjunctivae normal.  Neck:     Musculoskeletal: Neck supple.  Cardiovascular:     Rate and Rhythm: Regular rhythm. Tachycardia present.     Heart sounds: No murmur.  Pulmonary:     Effort: Pulmonary effort is normal. No respiratory distress.     Breath sounds: Normal breath sounds.  Abdominal:     Palpations: Abdomen is soft.     Tenderness: There is abdominal tenderness.     Comments: Mild tenderness to suprapubic area, negative rebound, nontender throughout upper abdomen, negative Rovsing, negative McBurney's No CVA tenderness  Skin:    General: Skin is warm and dry.  Neurological:     Mental Status: She is alert.      UC Treatments / Results  Labs (all labs ordered are listed, but only abnormal results are displayed) Labs Reviewed  POCT URINALYSIS DIP (DEVICE) - Abnormal; Notable for the following components:      Result Value   Glucose, UA 100 (*)    Hgb urine dipstick LARGE (*)    Protein, ur 100 (*)    Nitrite POSITIVE (*)    Leukocytes,Ua LARGE (*)    All other components within normal limits  URINE CULTURE    EKG None  Radiology No results found.  Procedures Procedures (including critical care time)  Medications  Ordered in UC Medications - No data to display  Initial Impression / Assessment and Plan / UC Course  I have reviewed the triage vital signs and the nursing notes.  Pertinent labs & imaging results that were available during my care of the patient were reviewed by me and considered in my medical decision making (see chart for details).     UA suggestive of UTI.  No fever or back pain at time of visit.  More likely cystitis versus pyelonephritis.  Will treat with Macrobid twice daily x5 days.  Push fluids.  Culture obtained, alter treatment if needed.  Continue to monitor,Discussed strict return precautions. Patient verbalized understanding and is agreeable with plan.  Final Clinical Impressions(s) / UC Diagnoses   Final diagnoses:  Lower urinary tract infectious disease     Discharge Instructions     Urine showed evidence of infection. We are treating you with macrobid- twice daily for 5 days. Be sure to take full course. Stay hydrated- urine should be pale yellow to clear.   Please return or follow up here or with your primary provider if symptoms not improving with treatment. Please return sooner if you have worsening of symptoms or develop fever, nausea, vomiting, abdominal pain, back pain, lightheadedness, dizziness.   ED Prescriptions    Medication Sig  Dispense Auth. Provider   nitrofurantoin, macrocrystal-monohydrate, (MACROBID) 100 MG capsule Take 1 capsule (100 mg total) by mouth 2 (two) times daily for 5 days. 10 capsule ,  C, PA-C     Controlled Substance Prescriptions [place] Controlled Substance Registry consulted? Not Applicable   Lew Dawes, [place] 09/01/18 1202

## 2018-09-01 NOTE — ED Triage Notes (Signed)
Pt here for hematuria and pain in bilateral flanks

## 2018-09-03 LAB — URINE CULTURE: Culture: >=100000 — AB

## 2018-09-04 ENCOUNTER — Telehealth (HOSPITAL_COMMUNITY): Payer: Self-pay | Admitting: Emergency Medicine

## 2018-09-04 NOTE — Telephone Encounter (Signed)
Urine culture was positive for e coli and was given  macrobid at urgent care visit. Pt contacted and made aware, educated on completing antibiotic and to follow up if symptoms are persistent. Verbalized understanding.    

## 2018-09-10 ENCOUNTER — Other Ambulatory Visit: Payer: Self-pay | Admitting: Family Medicine

## 2018-09-23 ENCOUNTER — Ambulatory Visit: Payer: PRIVATE HEALTH INSURANCE | Admitting: Family Medicine

## 2018-10-29 ENCOUNTER — Telehealth: Payer: PRIVATE HEALTH INSURANCE | Admitting: Physician Assistant

## 2018-10-29 DIAGNOSIS — M546 Pain in thoracic spine: Secondary | ICD-10-CM

## 2018-10-29 MED ORDER — CYCLOBENZAPRINE HCL 10 MG PO TABS: 5.0000 mg | ORAL_TABLET | Freq: Three times a day (TID) | ORAL | 0 refills | Status: DC | PRN

## 2018-10-29 MED ORDER — NAPROXEN 500 MG PO TABS: 500.0000 mg | ORAL_TABLET | Freq: Two times a day (BID) | ORAL | 0 refills | Status: DC

## 2018-10-29 NOTE — Progress Notes (Signed)
We are sorry that you are not feeling well.  Here is how we plan to help!  Based on what you have shared with me it looks like you mostly have acute back pain.  Acute back pain is defined as musculoskeletal pain that can resolve in 1-3 weeks with conservative treatment.  I have prescribed Naprosyn 500 mg twice a day non-steroid anti-inflammatory (NSAID) as well as Flexeril 10 mg every eight hours as needed which is a muscle relaxer  Some patients experience stomach irritation or in increased heartburn with anti-inflammatory drugs.  Please keep in mind that muscle relaxer's can cause fatigue and should not be taken while at work or driving.  Back pain is very common.  The pain often gets better over time.  The cause of back pain is usually not dangerous.  Most people can learn to manage their back pain on their own.  Home Care Stay active.  Start with short walks on flat ground if you can.  Try to walk farther each day. Do not sit, drive or stand in one place for more than 30 minutes.  Do not stay in bed. Do not avoid exercise or work.  Activity can help your back heal faster. Be careful when you bend or lift an object.  Bend at your knees, keep the object close to you, and do not twist. Sleep on a firm mattress.  Lie on your side, and bend your knees.  If you lie on your back, put a pillow under your knees. Only take medicines as told by your doctor. Put ice on the injured area. Put ice in a plastic bag Place a towel between your skin and the bag Leave the ice on for 15-20 minutes, 3-4 times a day for the first 2-3 days. 210 After that, you can switch between ice and heat packs. Ask your doctor about back exercises or massage. Avoid feeling anxious or stressed.  Find good ways to deal with stress, such as exercise.  Get Help Right Way If: Your pain does not go away with rest or medicine. Your pain does not go away in 1 week. You have new problems. You do not feel well. The pain spreads  into your legs. You cannot control when you poop (bowel movement) or pee (urinate) You feel sick to your stomach (nauseous) or throw up (vomit) You have belly (abdominal) pain. You feel like you may pass out (faint). If you develop a fever.  Make Sure you: Understand these instructions. Will watch your condition Will get help right away if you are not doing well or get worse.  Your e-visit answers were reviewed by a board certified advanced clinical practitioner to complete your personal care plan.  Depending on the condition, your plan could have included both over the counter or prescription medications.  If there is a problem please reply once you have received a response from your provider.  Your safety is important to [place].  If you have drug allergies check your prescription carefully.    You can use MyChart to ask questions about today's visit, request a non-urgent call back, or ask for a work or school excuse for 24 hours related to this e-Visit. If it has been greater than 24 hours you will need to follow up with your provider, or enter a new e-Visit to address those concerns.  You will get an e-mail in the next two days asking about your experience.  I hope that your e-visit has been valuable  and will speed your recovery. Thank you for using e-visits.    A total of 5-10 minutes was spent evaluating this patients questionnaire and formulating a plan of care.   ===View-only below this line===   ----- Message -----    From: Nicole [name]    Sent: 10/29/2018  3:21 PM EDT      To: E-Visit Mailing List Subject: Back Pain  Back Pain --------------------------------  Question: Where are you having pain Answer:   Mid to upper back  Question: Are you having any of the following respiratory symptoms with your pain? Answer:   None  Question: Does the pain extend into your legs? Answer:   No  Question: Are you having any numbness or weakness of the legs? Answer:    No  Question: Does this pain radiate to the abdomen or groin? Answer:   No  Question: How bad is the pain? Answer:   The pain is mild  Question: Did you have an injury that caused the pain? Answer:   No, I cannot remember an injury  Question: How long has the pain been present? Answer:   Just today  Question: Have you had back pain in the past? Answer:   Yes, I have infrequently had pain similar to this before  Question: Do you have a history of fever associated with your back pain? Answer:   No  Question: Please list any medications you have previously taken for back pain. Answer:   Ibuprofen (800 total mg) and aleve  Question: Do you have a fever? Answer:   No, I do not have a fever  Question: Do you have any of the following? Answer:   None of the above  Question: What makes the pain worse? Answer:   Sitting down  Question: What makes the pain better Answer:   Hot or cold compress            Lying in bed  Question: Do you get relief with certain positions (even if only partial relief)? Answer:   Yes  Question: Have you ever been diagnosed with cancer? Answer:   No  Question: Have you ever been diagnosed with arthritis? Answer:   No  Question: Have you ever been diagnosed with osteoporosis or any other bone weakness? Answer:   No  Question: Have you ever had surgery on your back or spine? Answer:   No  Question: Do you have a history of Intravenous Drug Use? Answer:   Yes  Question: What is your usual health status? Answer:   I am active and can move normally  Question: Are you pregnant? Answer:   I am confident that I am not pregnant  Question: Are you breastfeeding? Answer:   No  Question: Please list your medication allergies that you may have ? (If 'none' , please list as 'none') Answer:   Ceclor  Question: Please list any additional comments  Answer:   IV drug use ended in 2004  A total of 5-10 minutes was spent evaluating this patients  questionnaire and formulating a plan of care.

## 2018-10-31 ENCOUNTER — Telehealth: Payer: Self-pay | Admitting: Family Medicine

## 2018-10-31 NOTE — Telephone Encounter (Signed)
Patient is calling to set up an appt with PCP or anyone in office.  Patient had evisit 6/16 regarding her back pain.  She said it has gotten worse and the medication she was prescribed is not giving her any release.  Patient call back # [phone]

## 2018-10-31 NOTE — Telephone Encounter (Signed)
Pt is declining virtual visit. She would like to have an in-office visit and requests Toradol injection for pain.   Please advise.

## 2018-11-01 ENCOUNTER — Encounter: Payer: Self-pay | Admitting: Family Medicine

## 2018-11-01 ENCOUNTER — Other Ambulatory Visit: Payer: Self-pay

## 2018-11-01 ENCOUNTER — Ambulatory Visit: Payer: PRIVATE HEALTH INSURANCE | Admitting: Family Medicine

## 2018-11-01 VITALS — BP 100/60 | HR 112 | Temp 98.4°F | Ht 60.0 in | Wt 212.0 lb

## 2018-11-01 DIAGNOSIS — M62838 Other muscle spasm: Secondary | ICD-10-CM

## 2018-11-01 DIAGNOSIS — R252 Cramp and spasm: Secondary | ICD-10-CM | POA: Diagnosis not present

## 2018-11-01 DIAGNOSIS — M25512 Pain in left shoulder: Secondary | ICD-10-CM | POA: Diagnosis not present

## 2018-11-01 MED ORDER — PREDNISONE 20 MG PO TABS: ORAL_TABLET | ORAL | 0 refills | Status: DC

## 2018-11-01 MED ORDER — METHYLPREDNISOLONE ACETATE 80 MG/ML IJ SUSP
80.0000 mg | Freq: Once | INTRAMUSCULAR | Status: AC
  Administered 2018-11-01: 80 mg via INTRAMUSCULAR

## 2018-11-01 MED ORDER — KETOROLAC TROMETHAMINE 60 MG/2ML IM SOLN
60.0000 mg | Freq: Once | INTRAMUSCULAR | Status: AC
  Administered 2018-11-01: 60 mg via INTRAMUSCULAR

## 2018-11-01 MED ORDER — GABAPENTIN 100 MG PO CAPS: 100.0000 mg | ORAL_CAPSULE | Freq: Two times a day (BID) | ORAL | 0 refills | Status: DC

## 2018-11-01 NOTE — Progress Notes (Signed)
Nicole [name] DOB: [birth year] Encounter date: 11/01/2018  This is a 43 y.o. female who presents with Chief Complaint  Patient presents with  . Arm Pain    patient complains of left elbow/arm pain or "twitching" sensation x3 days, no known injury,used Flexeril and Naprosyn with no relief    History of present illness: Did E visit with Deliah BostonMichael Clark on 10/29/18 for acute back pain. Described mid to upper back; no radiation. Lying in bed helped; tried ibuprofen 800mg  and aleve. Given naprosyn 500mg  BID, flexeril 10mg  TID.   Started in her back - by left shoulder blade by spine. Started there and travels. Sometimes hurts all together. Hurts really badly to lift up left arm completely - started around elbow - lateral epicondyle - twitching/throbbing. Took ibuprofen. Twitching stopped, but aching didn't. Then spread to left shoulder and across upper back. Now in left shoulder and left pectoral. Then feeling spasming. Hurts really badly.   Has had 500mg  naprosyn today. Has had flexeril 10mg  twice today. Used ice, heat, tried to stretch but nothing is working. Not sure what she did. Has had similar type sx in calves in past; but not for some time. 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 Not getting any relief with any of above.   Has had intermittent calf cramping in last few years. Comes, is severe, lasts for days; has improved with toradol. has been evaluated (even had blood clots ruled out at 1 point).  No obvious cause has been found for this.  Does get severe spasm and pain feels similar to what she is enduring right now.   Allergies  Allergen Reactions  . Cashew Nut Oil Anaphylaxis    Cashews and [place] nuts  . Other Anaphylaxis    Nuts-[place] and cashews  . Cefaclor Rash  . Nickel Rash   Current Meds  Medication Sig  . albuterol (PROAIR HFA) 108 (90 Base) MCG/ACT inhaler Inhale 2 puffs into the lungs every 6 (six) hours as needed for wheezing or shortness of breath.  .  ARIPiprazole (ABILIFY) 15 MG tablet Take 15 mg by mouth daily.    . cetirizine (ZYRTEC) 10 MG tablet Take 10 mg by mouth daily.  Marland Kitchen. desvenlafaxine (PRISTIQ) 100 MG 24 hr tablet Take 100 mg by mouth daily.  Marland Kitchen. dicyclomine (BENTYL) 10 MG capsule Take 1 capsule (10 mg total) by mouth every 8 (eight) hours as needed. (Patient taking differently: Take 10 mg by mouth every 8 (eight) hours as needed for spasms. )  . diphenoxylate-atropine (LOMOTIL) 2.5-0.025 MG tablet Take 1 tablet by mouth 3 (three) times daily as needed for constipation.  Marland Kitchen. KURVELO 0.15-30 MG-MCG tablet TAKE 1 TABLET BY MOUTH EVERY DAY  . Multiple Vitamin (MULTIVITAMINS PO) Take 1 tablet by mouth daily.   . naproxen (NAPROSYN) 500 MG tablet Take 1 tablet (500 mg total) by mouth 2 (two) times daily with a meal.  . ondansetron (ZOFRAN ODT) 8 MG disintegrating tablet Take 1 tablet (8 mg total) by mouth every 8 (eight) hours as needed for nausea or vomiting.  . [DISCONTINUED] azithromycin (ZITHROMAX Z-PAK) 250 MG tablet Take 2 tablets on Day 1.  Then take 1 tablet daily.  . [DISCONTINUED] cyclobenzaprine (FLEXERIL) 10 MG tablet Take 0.5-1 tablets (5-10 mg total) by mouth 3 (three) times daily as needed for muscle spasms.  . [DISCONTINUED] HYDROcodone-homatropine (HYCODAN) 5-1.5 MG/5ML syrup Take 5 mLs by mouth every 8 (eight) hours as needed for cough.  . [DISCONTINUED] predniSONE (DELTASONE) 50 MG tablet  Take daily    Review of Systems  Constitutional: Negative for chills, fatigue and fever.  Respiratory: Negative for cough, chest tightness, shortness of breath and wheezing.   Cardiovascular: Negative for chest pain, palpitations and leg swelling.  Musculoskeletal: Positive for arthralgias, back pain and neck pain. Negative for neck stiffness.       She has pain around the right shoulder.  Uncertain if pain is within shoulder joint.  Describes some pain within the left elbow as well.  Certainly muscular pain with muscle spasm of trap,  pectoral, but even with upper arm.    Objective:  BP 100/60 (BP Location: Right Arm, Patient Position: Sitting, Cuff Size: Large)   Pulse (!) 112   Temp 98.4 F (36.9 C) (Oral)   Ht 5' (1.524 m)   Wt 212 lb (96.2 kg)   SpO2 98%   BMI 41.40 kg/m   Weight: 212 lb (96.2 kg)   BP Readings from Last 3 Encounters:  11/01/18 100/60  09/01/18 122/67  08/07/18 (!) 145/97   Wt Readings from Last 3 Encounters:  11/01/18 212 lb (96.2 kg)  08/06/18 208 lb (94.3 kg)  06/25/18 203 lb 9.6 oz (92.4 kg)    Physical Exam Constitutional:      General: She is not in acute distress.    Appearance: She is well-developed.  Cardiovascular:     Rate and Rhythm: Normal rate and regular rhythm.     Heart sounds: Normal heart sounds. No murmur. No friction rub.  Pulmonary:     Effort: Pulmonary effort is normal. No respiratory distress.     Breath sounds: Normal breath sounds. No wheezing or rales.  Musculoskeletal:     Right lower leg: No edema.     Left lower leg: No edema.     Comments: No obvious abnormality of the shoulder, elbow, wrist joints.  No overlying skin changes.  There is tenderness with palpation to the shoulder, but more specifically the trapezius and pectoralis muscles.  There is not significant tenderness to the lateral right or medial epicondyles of the elbow.  There is pain with extension or flexion of the elbow, abduction of the shoulder.  There is pain with passive movement of the shoulder.  There is pain with neck extension and flexion, which is mostly felt within the left trapezius area.  Neurological:     Mental Status: She is alert and oriented to person, place, and time.     Sensory: Sensation is intact.     Motor: No weakness or tremor.     Deep Tendon Reflexes:     Reflex Scores:      Tricep reflexes are 2+ on the right side and 2+ on the left side.      Bicep reflexes are 2+ on the right side and 2+ on the left side.      Brachioradialis reflexes are 2+ on the right  side and 2+ on the left side. Psychiatric:        Behavior: Behavior normal.     Assessment/Plan  1. Muscle spasm She is not getting any relief from muscle relaxers.  I feel this is more of an inflammatory issue currently.  We will try to use steroids to take down inflammation, since ibuprofen and Naprosyn have not helped.  We also discussed that pain may be coming from nerve.  What is unusual is that there is not been any significant injury or to pain.  We will check in with her on Monday  to see how pain is doing, but I have encouraged her to let us know sooner if any worsening.  Continue with modalities that may provide some comfort, like heat or ice.  Suggested trial of lidocaine along with arthritis type cream, especially for nerve distractor. - methylPREDNISolone acetate (DEPO-MEDROL) injection 80 mg - ketorolac (TORADOL) injection 60 mg - predniSONE (DELTASONE) 20 MG tablet; Take 3 tablets PO daily x 2 days, then 2 tablets PO daily x 3 days, then 1 tab daily x 2 days, then 1/2 tab daily x 2 days.  Dispense: 15 tablet; Refill: 0  2. Muscle cramps This is been recurrent issue.  Most recent blood work Looked normal, but she is concerned there is a more ongoing problem that is causing cramping (just previously in lower extremities).  Also concern for autoimmune issue.  Since she is here in the office today we can grab blood work patient. - Magnesium, RBC; Future - ANA; Future - CBC with Differential/Platelet; Future - Ferritin; Future - TSH; Future - Comprehensive metabolic panel; Future - Comprehensive metabolic panel - TSH - Ferritin - CBC with Differential/Platelet - ANA - Magnesium, RBC  3. Acute pain of left shoulder See above.  Since there is not been any known injury to the shoulder, I do not think that imaging is helpful at this point. - gabapentin (NEURONTIN) 100 MG capsule; Take 1-3 capsules (100-300 mg total) by mouth 2 (two) times daily.  Dispense: 30 capsule; Refill:  0 - predniSONE (DELTASONE) 20 MG tablet; Take 3 tablets PO daily x 2 days, then 2 tablets PO daily x 3 days, then 1 tab daily x 2 days, then 1/2 tab daily x 2 days.  Dispense: 15 tablet; Refill: 0 - ANA; Future - C-reactive protein; Future - Sedimentation rate; Future - Sedimentation rate - C-reactive protein - ANA       Micheline Rough, MD

## 2018-11-01 NOTE — Telephone Encounter (Signed)
Ok to put in open slot

## 2018-11-01 NOTE — Telephone Encounter (Signed)
I called the pt and scheduled an in office visit for today at 2pm.

## 2018-11-04 LAB — CBC WITH DIFFERENTIAL/PLATELET
Absolute Monocytes: 678 cells/uL (ref 200–950)
Basophils Absolute: 53 cells/uL (ref 0–200)
Basophils Relative: 0.6 %
Eosinophils Absolute: 114 cells/uL (ref 15–500)
Eosinophils Relative: 1.3 %
HCT: 43.6 % (ref 35.0–45.0)
Hemoglobin: 15 g/dL (ref 11.7–15.5)
Lymphs Abs: 3590 cells/uL (ref 850–3900)
MCH: 31.3 pg (ref 27.0–33.0)
MCHC: 34.4 g/dL (ref 32.0–36.0)
MCV: 90.8 fL (ref 80.0–100.0)
MPV: 10 fL (ref 7.5–12.5)
Monocytes Relative: 7.7 %
Neutro Abs: 4365 cells/uL (ref 1500–7800)
Neutrophils Relative %: 49.6 %
Platelets: 355 10*3/uL (ref 140–400)
RBC: 4.8 10*6/uL (ref 3.80–5.10)
RDW: 12.4 % (ref 11.0–15.0)
Total Lymphocyte: 40.8 %
WBC: 8.8 10*3/uL (ref 3.8–10.8)

## 2018-11-04 LAB — TSH: TSH: 2.27 mIU/L

## 2018-11-04 LAB — EXTRA SPECIMEN

## 2018-11-04 LAB — COMPREHENSIVE METABOLIC PANEL
AG Ratio: 1.5 (calc) (ref 1.0–2.5)
ALT: 25 U/L (ref 6–29)
AST: 23 U/L (ref 10–30)
Albumin: 4.1 g/dL (ref 3.6–5.1)
Alkaline phosphatase (APISO): 32 U/L (ref 31–125)
BUN: 8 mg/dL (ref 7–25)
CO2: 23 mmol/L (ref 20–32)
Calcium: 9.3 mg/dL (ref 8.6–10.2)
Chloride: 104 mmol/L (ref 98–110)
Creat: 0.96 mg/dL (ref 0.50–1.10)
Globulin: 2.7 g/dL (calc) (ref 1.9–3.7)
Glucose, Bld: 96 mg/dL (ref 65–99)
Potassium: 4.3 mmol/L (ref 3.5–5.3)
Sodium: 139 mmol/L (ref 135–146)
Total Bilirubin: 0.4 mg/dL (ref 0.2–1.2)
Total Protein: 6.8 g/dL (ref 6.1–8.1)

## 2018-11-04 LAB — SEDIMENTATION RATE: Sed Rate: 9 mm/h (ref 0–20)

## 2018-11-04 LAB — FERRITIN: Ferritin: 54 ng/mL (ref 16–232)

## 2018-11-04 LAB — MAGNESIUM, RBC: Magnesium RBC: 5.9 mg/dL (ref 4.0–6.4)

## 2018-11-04 LAB — C-REACTIVE PROTEIN: CRP: 7.5 mg/L (ref <8.0)

## 2018-11-04 LAB — ANA: Anti Nuclear Antibody (ANA): NEGATIVE

## 2018-11-08 ENCOUNTER — Ambulatory Visit (INDEPENDENT_AMBULATORY_CARE_PROVIDER_SITE_OTHER): Payer: PRIVATE HEALTH INSURANCE | Admitting: Family Medicine

## 2018-11-08 ENCOUNTER — Encounter: Payer: Self-pay | Admitting: Family Medicine

## 2018-11-08 ENCOUNTER — Ambulatory Visit: Payer: Self-pay | Admitting: Family Medicine

## 2018-11-08 ENCOUNTER — Other Ambulatory Visit: Payer: Self-pay

## 2018-11-08 DIAGNOSIS — T887XXA Unspecified adverse effect of drug or medicament, initial encounter: Secondary | ICD-10-CM | POA: Diagnosis not present

## 2018-11-08 DIAGNOSIS — R202 Paresthesia of skin: Secondary | ICD-10-CM | POA: Diagnosis not present

## 2018-11-08 DIAGNOSIS — M542 Cervicalgia: Secondary | ICD-10-CM | POA: Diagnosis not present

## 2018-11-08 DIAGNOSIS — R200 Anesthesia of skin: Secondary | ICD-10-CM

## 2018-11-08 NOTE — Telephone Encounter (Signed)
  Pt. Reports she recently started Prednisone and Neurontin for left shoulder pain. Noticed some dizziness with the medications and stopped the Neurontin. Dizziness continued and feels it could be coming from the Prednisone, Warm transfer to [place] in the practice.  Answer Assessment - Initial Assessment Questions 1. DESCRIPTION: "Describe your dizziness."     Lightheaded 2. LIGHTHEADED: "Do you feel lightheaded?" (e.g., somewhat faint, woozy, weak upon standing)     Woozy 3. VERTIGO: "Do you feel like either you or the room is spinning or tilting?" (i.e. vertigo)     Sometimes 4. SEVERITY: "How bad is it?"  "Do you feel like you are going to faint?" "Can you stand and walk?"   - MILD - walking normally   - MODERATE - interferes with normal activities (e.g., work, school)    - SEVERE - unable to stand, requires support to walk, feels like passing out now.      Moderate 5. ONSET:  "When did the dizziness begin?"     This week 6. AGGRAVATING FACTORS: "Does anything make it worse?" (e.g., standing, change in head position)     Changing positions 7. HEART RATE: "Can you tell me your heart rate?" "How many beats in 15 seconds?"  (Note: not all patients can do this)       No 8. CAUSE: "What do you think is causing the dizziness?"     Unsure- maybe from the Prednisone 9. RECURRENT SYMPTOM: "Have you had dizziness before?" If so, ask: "When was the last time?" "What happened that time?"     No 10. OTHER SYMPTOMS: "Do you have any other symptoms?" (e.g., fever, chest pain, vomiting, diarrhea, bleeding)       No 11. PREGNANCY: "Is there any chance you are pregnant?" "When was your last menstrual period?"       No  Protocols used: DIZZINESS [facility]

## 2018-11-08 NOTE — Telephone Encounter (Signed)
We had a Doxy visit today  

## 2018-11-08 NOTE — Progress Notes (Signed)
Subjective:    Patient ID: Nicole [name], female    DOB: [birth year], 43 y.o.   MRN: [id number]  HPI Virtual Visit via Video Note  I connected with the patient on 11/08/18 at  2:15 PM EDT by a video enabled telemedicine application and verified that I am speaking with the correct person using two identifiers.  Location patient: home Location provider:work or home office Persons participating in the virtual visit: patient, provider  I discussed the limitations of evaluation and management by telemedicine and the availability of in person appointments. The patient expressed understanding and agreed to proceed.   HPI: Here to discuss several different issues. First she had seen Dr. Ethlyn Gallery on 11-01-18 for pain in the neck and left shoulder area, and she was given shots of DepoMedrol and Toradol that day. She was also started on Gabapentin and a Prednisone taper. Since then the pains have eased up a little, but she now also has numbness and tingling that runs down the left arm to the hand for the past 2 days. In addition for the past 2 days she has felt tired, sluggish, dizzy, and she has trouble thinking clearly. No fever or headache or cough or SOB.    ROS: See pertinent positives and negatives per HPI.  Past Medical History:  Diagnosis Date  . Abnormal pap    w LEEP  . Alcoholism ([place])   . Allergy   . Asthma   . Blood in stool   . Depression (emotion)   . Frequent headaches   . Hepatitis C, chronic (HCC)    hep c tx successful  . Irritable bowel syndrome   . OSA on CPAP - sees Dr. Tami Ribas in ENT 10/09/2013  . Seasonal allergies    scratch test in 2006///mold issue in house    Past Surgical History:  Procedure Laterality Date  . [place], 2001, 2002   x 3  . LEEP  2008  . NASAL FRACTURE SURGERY  1999   repair  . NASAL SEPTOPLASTY W/ TURBINOPLASTY      Family History  Problem Relation Age of Onset  . Emphysema Mother   . Colon polyps  Mother   . Diverticulitis Mother   . Heart disease Father   . Cancer Father        unsure??  . Hypertension Father   . Hepatitis C Father   . Stroke Maternal Grandmother   . Osteoporosis Maternal Grandmother      Current Outpatient Medications:  .  albuterol (PROAIR HFA) 108 (90 Base) MCG/ACT inhaler, Inhale 2 puffs into the lungs every 6 (six) hours as needed for wheezing or shortness of breath., Disp: 1 Inhaler, Rfl: 3 .  ARIPiprazole (ABILIFY) 15 MG tablet, Take 15 mg by mouth daily.  , Disp: , Rfl:  .  cetirizine (ZYRTEC) 10 MG tablet, Take 10 mg by mouth daily., Disp: , Rfl:  .  desvenlafaxine (PRISTIQ) 100 MG 24 hr tablet, Take 100 mg by mouth daily., Disp: , Rfl:  .  dicyclomine (BENTYL) 10 MG capsule, Take 1 capsule (10 mg total) by mouth every 8 (eight) hours as needed. (Patient taking differently: Take 10 mg by mouth every 8 (eight) hours as needed for spasms. ), Disp: 90 capsule, Rfl: 1 .  diphenoxylate-atropine (LOMOTIL) 2.5-0.025 MG tablet, Take 1 tablet by mouth 3 (three) times daily as needed for constipation., Disp: , Rfl:  .  gabapentin (NEURONTIN) 100 MG capsule, Take 1-3 capsules (  100-300 mg total) by mouth 2 (two) times daily., Disp: 30 capsule, Rfl: 0 .  KURVELO 0.15-30 MG-MCG tablet, TAKE 1 TABLET BY MOUTH EVERY DAY, Disp: 84 tablet, Rfl: 1 .  Multiple Vitamin (MULTIVITAMINS PO), Take 1 tablet by mouth daily. , Disp: , Rfl:  .  naproxen (NAPROSYN) 500 MG tablet, Take 1 tablet (500 mg total) by mouth 2 (two) times daily with a meal., Disp: 30 tablet, Rfl: 0 .  ondansetron (ZOFRAN ODT) 8 MG disintegrating tablet, Take 1 tablet (8 mg total) by mouth every 8 (eight) hours as needed for nausea or vomiting., Disp: 30 tablet, Rfl: 3 .  predniSONE (DELTASONE) 20 MG tablet, Take 3 tablets PO daily x 2 days, then 2 tablets PO daily x 3 days, then 1 tab daily x 2 days, then 1/2 tab daily x 2 days., Disp: 15 tablet, Rfl: 0  EXAM:  VITALS per patient if applicable:  GENERAL:  alert, oriented, appears well and in no acute distress  HEENT: atraumatic, conjunttiva clear, no obvious abnormalities on inspection of external nose and ears  NECK: normal movements of the head and neck  LUNGS: on inspection no signs of respiratory distress, breathing rate appears normal, no obvious gross SOB, gasping or wheezing  CV: no obvious cyanosis  MS: moves all visible extremities without noticeable abnormality  PSYCH/NEURO: pleasant and cooperative, no obvious depression or anxiety, speech and thought processing grossly intact  ASSESSMENT AND PLAN: First the left arm numbness is likely related to the pain she has been experiencing in the neck and left shoulder area. She likely has a pinched nerve in the neck or shoulder. I asked her to follow up with Dr. Hassan RowanKoberlein if this continues, and she will likely require imaging of some sort. The other symptoms she describes are probably side effects from the steroids and the Gabapentin. I asked her to stop taking both the Prednisone and the Gabapentin, and to rest over the weekend.  Gershon CraneStephen Pamila Mendibles, MD  Discussed the following assessment and plan:  No diagnosis found.     I discussed the assessment and treatment plan with the patient. The patient was provided an opportunity to ask questions and all were answered. The patient agreed with the plan and demonstrated an understanding of the instructions.   The patient was advised to call back or seek an in-person evaluation if the symptoms worsen or if the condition fails to improve as anticipated.     Review of Systems     Objective:   Physical Exam        Assessment & Plan:

## 2018-11-08 NOTE — Telephone Encounter (Signed)
Will send as FYI 

## 2018-12-19 DIAGNOSIS — K580 Irritable bowel syndrome with diarrhea: Secondary | ICD-10-CM | POA: Diagnosis not present

## 2019-01-07 DIAGNOSIS — L309 Dermatitis, unspecified: Secondary | ICD-10-CM | POA: Diagnosis not present

## 2019-01-07 DIAGNOSIS — L219 Seborrheic dermatitis, unspecified: Secondary | ICD-10-CM | POA: Diagnosis not present

## 2019-01-16 ENCOUNTER — Encounter: Payer: Self-pay | Admitting: Family Medicine

## 2019-01-20 ENCOUNTER — Telehealth: Payer: PRIVATE HEALTH INSURANCE | Admitting: Family

## 2019-01-20 DIAGNOSIS — Z20822 Contact with and (suspected) exposure to covid-19: Secondary | ICD-10-CM

## 2019-01-20 MED ORDER — ALBUTEROL SULFATE HFA 108 (90 BASE) MCG/ACT IN AERS: 2.0000 | INHALATION_SPRAY | Freq: Four times a day (QID) | RESPIRATORY_TRACT | 0 refills | Status: DC | PRN

## 2019-01-20 NOTE — Progress Notes (Signed)
E-Visit for Corona Virus Screening   Your current symptoms could be consistent with the coronavirus.  Many health care providers can now test patients at their office but not all are.   has multiple testing sites. For information on our COVID testing locations and hours go to [email or web address]  Please quarantine yourself while awaiting your test results.  We are enrolling you in our MyChart Home Montioring for COVID19 . Daily you will receive a questionnaire within the MyChart website. Our COVID 19 response team willl be monitoriing your responses daily.  You can go to one of the  testing sites listed below, while they are opened (see hours). You do not need an order and will stay in your car during the test. You do need to self isolate until your results return and if positive 14 days from when your symptoms started and until you are 3 days symptom free.   Testing Locations (Monday - Friday, 8 a.m. - 3:30 p.m.) . [place]: [facility] at [facility], [address], [place]  . [place]: [address] [facility], [address], [place], [place] (entrance off [address])  . [facility]: (Closed each Monday): Closed on Wednesday, Sept. 2. Beginning Sept. 3 at 8 a.m., testing will be relocated to the short stay covered drive at [facility]. (Use the [address] entrance to [facility] next to [facility].)  Approximately 5 minutes was spent documenting and reviewing patient's chart.    COVID-19 is a respiratory illness with symptoms that are similar to the flu. Symptoms are typically mild to moderate, but there have been cases of severe illness and death due to the virus. The following symptoms may appear 2-14 days after exposure: . Fever . Cough . Shortness of breath or difficulty breathing . Chills . Repeated shaking with chills . Muscle pain . Headache . Sore throat . New loss  of taste or smell . Fatigue . Congestion or runny nose . Nausea or vomiting . Diarrhea  It is vitally important that if you feel that you have an infection such as this virus or any other virus that you stay home and away from places where you may spread it to others.  You should self-quarantine for 14 days if you have symptoms that could potentially be coronavirus or have been in close contact a with a person diagnosed with COVID-19 within the last 2 weeks. You should avoid contact with people age 41 and older.   You should wear a mask or cloth face covering over your nose and mouth if you must be around other people or animals, including pets (even at home). Try to stay at least 6 feet away from other people. This will protect the people around you.  You can use medication such as A prescription inhaler called Albuterol MDI 90 mcg /actuation 2 puffs every 4 hours as needed for shortness of breath, wheezing, cough  You may also take acetaminophen (Tylenol) as needed for fever.   Reduce your risk of any infection by using the same precautions used for avoiding the common cold or flu:  Marland Kitchen. Wash your hands often with soap and warm water for at least 20 seconds.  If soap and water are not readily available, use an alcohol-based hand sanitizer with at least 60% alcohol.  . If coughing or sneezing, cover your mouth and nose by coughing or sneezing into the elbow areas of your shirt or coat, into a  tissue or into your sleeve (not your hands). . Avoid shaking hands with others and consider head nods or verbal greetings only. . Avoid touching your eyes, nose, or mouth with unwashed hands.  . Avoid close contact with people who are sick. . Avoid places or events with large numbers of people in one location, like concerts or sporting events. . Carefully consider travel plans you have or are making. . If you are planning any travel outside or inside the [place], visit the CDC's Travelers' Health webpage for the  latest health notices. . If you have some symptoms but not all symptoms, continue to monitor at home and seek medical attention if your symptoms worsen. . If you are having a medical emergency, call 911.  HOME CARE . Only take medications as instructed by your medical team. . Drink plenty of fluids and get plenty of rest. . A steam or ultrasonic humidifier can help if you have congestion.   GET HELP RIGHT AWAY IF YOU HAVE EMERGENCY WARNING SIGNS** FOR COVID-19. If you or someone is showing any of these signs seek emergency medical care immediately. Call 911 or proceed to your closest emergency facility if: . You develop worsening high fever. . Trouble breathing . Bluish lips or face . Persistent pain or pressure in the chest . New confusion . Inability to wake or stay awake . You cough up blood. . Your symptoms become more severe  **This list is not all possible symptoms. Contact your medical provider for any symptoms that are sever or concerning to you.   MAKE SURE YOU   Understand these instructions.  Will watch your condition.  Will get help right away if you are not doing well or get worse.  Your e-visit answers were reviewed by a board certified advanced clinical practitioner to complete your personal care plan.  Depending on the condition, your plan could have included both over the counter or prescription medications.  If there is a problem please reply once you have received a response from your provider.  Your safety is important to [place].  If you have drug allergies check your prescription carefully.    You can use MyChart to ask questions about today's visit, request a non-urgent call back, or ask for a work or school excuse for 24 hours related to this e-Visit. If it has been greater than 24 hours you will need to follow up with your provider, or enter a new e-Visit to address those concerns. You will get an e-mail in the next two days asking about your experience.  I hope  that your e-visit has been valuable and will speed your recovery. Thank you for using e-visits.

## 2019-02-20 ENCOUNTER — Encounter: Payer: Self-pay | Admitting: Internal Medicine

## 2019-02-20 ENCOUNTER — Other Ambulatory Visit: Payer: Self-pay

## 2019-02-20 ENCOUNTER — Ambulatory Visit: Payer: Self-pay | Admitting: *Deleted

## 2019-02-20 ENCOUNTER — Ambulatory Visit: Payer: BC Managed Care – PPO | Admitting: Internal Medicine

## 2019-02-20 VITALS — BP 120/70 | HR 85 | Temp 97.4°F | Wt 209.5 lb

## 2019-02-20 DIAGNOSIS — G43911 Migraine, unspecified, intractable, with status migrainosus: Secondary | ICD-10-CM

## 2019-02-20 MED ORDER — PROMETHAZINE HCL 25 MG PO TABS: ORAL_TABLET | ORAL | 0 refills | Status: DC

## 2019-02-20 MED ORDER — KETOROLAC TROMETHAMINE 60 MG/2ML IM SOLN
60.0000 mg | Freq: Once | INTRAMUSCULAR | Status: AC
  Administered 2019-02-20: 13:00:00 60 mg via INTRAMUSCULAR

## 2019-02-20 MED ORDER — KETOROLAC TROMETHAMINE 60 MG/2ML IM SOLN: 60.0000 mg | Freq: Once | INTRAMUSCULAR | Status: DC

## 2019-02-20 NOTE — Progress Notes (Signed)
Established Patient Office Visit     CC/Reason for Visit: Migraine  HPI: Nicole [name] is a 43 y.o. female who is coming in today for the above mentioned reasons. She has a h/o of migraines and IBS. When she takes zofran and lomotil together for her IBS it usually triggers a migraine. Current HA started yesterday and has not abated since. She took 4 ibuprofen yesterday, 4 ibuprofen this am and 2 aleve later this am without relief. +nausea but no emesis. Significant photosensitivity. Toradol has worked in the past. No blurry vision or focal neurologic deficits.   Past Medical/Surgical History: Past Medical History:  Diagnosis Date  . Abnormal pap    w LEEP  . Alcoholism ([facility])   . Allergy   . Asthma   . Blood in stool   . Depression (emotion)   . Frequent headaches   . Hepatitis C, chronic (HCC)    hep c tx successful  . Irritable bowel syndrome   . OSA on CPAP - sees Dr. Jenne Campus in ENT 10/09/2013  . Seasonal allergies    scratch test in 2006///mold issue in house    Past Surgical History:  Procedure Laterality Date  . DILATION AND CURETTAGE OF UTERUS  1996, 2001, 2002   x 3  . LEEP  2008  . NASAL FRACTURE SURGERY  1999   repair  . NASAL SEPTOPLASTY W/ TURBINOPLASTY      Social History:  reports that she quit smoking about 13 years ago. Her smoking use included cigarettes. She has a 22.50 pack-year smoking history. She has never used smokeless tobacco. She reports that she does not drink alcohol or use drugs.  Allergies: Allergies  Allergen Reactions  . Cashew Nut Oil Anaphylaxis    Cashews and [place] nuts  . Other Anaphylaxis    Nuts-[place] and cashews  . Cefaclor Rash  . Nickel Rash    Family History:  Family History  Problem Relation Age of Onset  . Emphysema Mother   . Colon polyps Mother   . Diverticulitis Mother   . Heart disease Father   . Cancer Father        unsure??  . Hypertension Father   . Hepatitis C Father   . Stroke Maternal  Grandmother   . Osteoporosis Maternal Grandmother      Current Outpatient Medications:  .  albuterol (VENTOLIN HFA) 108 (90 Base) MCG/ACT inhaler, Inhale 2 puffs into the lungs every 6 (six) hours as needed for wheezing or shortness of breath., Disp: 8 g, Rfl: 0 .  ARIPiprazole (ABILIFY) 15 MG tablet, Take 15 mg by mouth daily.  , Disp: , Rfl:  .  cetirizine (ZYRTEC) 10 MG tablet, Take 10 mg by mouth daily., Disp: , Rfl:  .  desvenlafaxine (PRISTIQ) 100 MG 24 hr tablet, Take 100 mg by mouth daily., Disp: , Rfl:  .  dicyclomine (BENTYL) 10 MG capsule, Take 1 capsule (10 mg total) by mouth every 8 (eight) hours as needed. (Patient taking differently: Take 10 mg by mouth every 8 (eight) hours as needed for spasms. ), Disp: 90 capsule, Rfl: 1 .  diphenoxylate-atropine (LOMOTIL) 2.5-0.025 MG tablet, Take 1 tablet by mouth 3 (three) times daily as needed for constipation., Disp: , Rfl:  .  KURVELO 0.15-30 MG-MCG tablet, TAKE 1 TABLET BY MOUTH EVERY DAY, Disp: 84 tablet, Rfl: 1 .  Multiple Vitamin (MULTIVITAMINS PO), Take 1 tablet by mouth daily. , Disp: , Rfl:  .  naproxen (NAPROSYN)  500 MG tablet, Take 1 tablet (500 mg total) by mouth 2 (two) times daily with a meal., Disp: 30 tablet, Rfl: 0 .  ondansetron (ZOFRAN ODT) 8 MG disintegrating tablet, Take 1 tablet (8 mg total) by mouth every 8 (eight) hours as needed for nausea or vomiting., Disp: 30 tablet, Rfl: 3 .  gabapentin (NEURONTIN) 100 MG capsule, Take 1-3 capsules (100-300 mg total) by mouth 2 (two) times daily. (Patient not taking: Reported on 02/20/2019), Disp: 30 capsule, Rfl: 0 .  promethazine (PHENERGAN) 25 MG tablet, Take 1 tablet tonight for migraine. May use second tablet tomorrow if needed., Disp: 2 tablet, Rfl: 0  Current Facility-Administered Medications:  .  ketorolac (TORADOL) injection 60 mg, 60 mg, Intramuscular, Once, Isaac Bliss, Rayford Halsted, MD  Review of Systems:  Constitutional: Denies fever, chills, diaphoresis, appetite  change and fatigue.  HEENT: Denies eye redness, hearing loss, ear pain, congestion, sore throat, rhinorrhea, sneezing, mouth sores, trouble swallowing, neck pain, neck stiffness and tinnitus.   Respiratory: Denies SOB, DOE, cough, chest tightness,  and wheezing.   Cardiovascular: Denies chest pain, palpitations and leg swelling.  Gastrointestinal: Denies nausea, vomiting, abdominal pain, diarrhea, constipation, blood in stool and abdominal distention.  Genitourinary: Denies dysuria, urgency, frequency, hematuria, flank pain and difficulty urinating.  Endocrine: Denies: hot or cold intolerance, sweats, changes in hair or nails, polyuria, polydipsia. Musculoskeletal: Denies myalgias, back pain, joint swelling, arthralgias and gait problem.  Skin: Denies pallor, rash and wound.  Neurological: Denies dizziness, seizures, syncope, weakness, light-headedness, numbness. Hematological: Denies adenopathy. Easy bruising, personal or family bleeding history  Psychiatric/Behavioral: Denies suicidal ideation, mood changes, confusion, nervousness, sleep disturbance and agitation    Physical Exam: Vitals:   02/20/19 1306  BP: 120/70  Pulse: 85  Temp: (!) 97.4 F (36.3 C)  TempSrc: Temporal  SpO2: 97%  Weight: 209 lb 8 oz (95 kg)    Body mass index is 40.92 kg/m.   Constitutional: NAD, calm, comfortable Eyes: PERRL, lids and conjunctivae normal, wears corrective lenses. ENMT: Mucous membranes are moist. Respiratory: clear to auscultation bilaterally, no wheezing, no crackles. Normal respiratory effort. No accessory muscle use.  Cardiovascular: Regular rate and rhythm, no murmurs / rubs / gallops. No extremity edema. 2+ pedal pulses. No carotid bruits.  Abdomen: no tenderness, no masses palpated. No hepatosplenomegaly. Bowel sounds positive.  Musculoskeletal: no clubbing / cyanosis. No joint deformity upper and lower extremities. Good ROM, no contractures. Normal muscle tone.  Skin: no rashes,  lesions, ulcers. No induration Neurologic: CN 2-12 grossly intact. Sensation intact, DTR normal. Strength 5/5 in all 4.  Psychiatric: Normal judgment and insight. Alert and oriented x 3. Normal mood.    Impression and Plan:  Intractable migraine with status migrainosus, unspecified migraine type  -Given Toradol 60 mg in office today. -Advised to take benadryl 25 mg and phenergan 25 mg together tonight when she is able to rest for at least 8 hours. -She has been given a prescription for phenergan 25 mg #2 so she can repeat dose tomorrow if needed. -RTC in 7-10 days if no improvement.    Patient Instructions  -Nice seeing you today!!  -Toradol injection in office today.  -Take phenergan 25 mg and benadryl 25 mg together tonight at bedtime for your migraine. May repeat dose tomorrow evening if needed.  -Return in 7-10 days if no improvement.     Lelon Frohlich, MD [facility] at [facility]

## 2019-02-20 NOTE — Telephone Encounter (Signed)
Pt scheduled for OV today.  

## 2019-02-20 NOTE — Telephone Encounter (Signed)
Pt called with having a headache that has been going on since yesterday. She can normally take her mediations (ibuprofen and sometimes aleve) and they help. But this time she is not getting relief after taking both of those this morning. Pt advised of not taking both these medications so close together. She voiced understanding.  She has a history of IBS and will take Lomotil and then have to take Zofran for nausea. She gets dehydrated that causes her to have these headaches. Her pain # is  3 or 4 now. But can get up to a migraine if not relieved She is advised to cautiously to take the ibuprofen and aleve as needed. To rest in a dark room, use a cool compress to her head and massage her neck. She voiced understanding and has done these things. Per protocol, she should be seen in the office within 3 days. She is requesting for an appointment for today. [facility] notified for an appointment. Called conference in with pt.    Reason for Disposition . [1] MILD-MODERATE headache AND [2] present > 72 hours  Answer Assessment - Initial Assessment Questions 1. LOCATION: "Where does it hurt?"      Front and sides and base of skull 2. ONSET: "When did the headache start?" (Minutes, hours or days)      yesterday 3. PATTERN: "Does the pain come and go, or has it been constant since it started?"     Constant 4. SEVERITY: "How bad is the pain?" and "What does it keep you from doing?"  (e.g., Scale 1-10; mild, moderate, or severe)   - MILD (1-3): doesn't interfere with normal activities    - MODERATE (4-7): interferes with normal activities or awakens from sleep    - SEVERE (8-10): excruciating pain, unable to do any normal activities        Pain # 3 or 4 5. RECURRENT SYMPTOM: "Have you ever had headaches before?" If so, ask: "When was the last time?" and "What happened that time?"      Yes, if unresponsive to ibuprofen then goes to the office to get a shot and the last time she does not remember 6.  CAUSE: "What do you think is causing the headache?"     IBS and take lomotil and every once in a while she takes Zofran for nausea and does not hydrate she gets a headache 7. MIGRAINE: "Have you been diagnosed with migraine headaches?" If so, ask: "Is this headache similar?"      Not at migraine level yet 8. HEAD INJURY: "Has there been any recent injury to the head?"      no 9. OTHER SYMPTOMS: "Do you have any other symptoms?" (fever, stiff neck, eye pain, sore throat, cold symptoms)     Stiff neck 10. PREGNANCY: "Is there any chance you are pregnant?" "When was your last menstrual period?"       LMP was last week but not sure, she is irregular  Protocols used: HEADACHE-A-AH

## 2019-02-20 NOTE — Patient Instructions (Signed)
-  Nice seeing you today!!  -Toradol injection in office today.  -Take phenergan 25 mg and benadryl 25 mg together tonight at bedtime for your migraine. May repeat dose tomorrow evening if needed.  -Return in 7-10 days if no improvement.

## 2019-02-22 ENCOUNTER — Other Ambulatory Visit: Payer: Self-pay | Admitting: Family Medicine

## 2019-03-12 ENCOUNTER — Telehealth: Payer: Self-pay

## 2019-03-12 DIAGNOSIS — Z9989 Dependence on other enabling machines and devices: Secondary | ICD-10-CM

## 2019-03-12 DIAGNOSIS — G4733 Obstructive sleep apnea (adult) (pediatric): Secondary | ICD-10-CM

## 2019-03-12 NOTE — Telephone Encounter (Signed)
Copied from [place] [phone]. Topic: Referral - Request for Referral >> Mar 12, 2019  3:54 PM Selinda Flavin B, [place] wrote: Has patient seen PCP for this complaint? Yes.   *If NO, is insurance requiring patient see PCP for this issue before PCP can refer them? Referral for which specialty: Pulmonary / sleep specialist Preferred provider/office:  Reason for referral:States that Dr Maudie Mercury placed a referral a while back but patient was unable to go at that [facility] and now that referral has expired, would like a new referral so she can get a new CPAP

## 2019-03-12 NOTE — Telephone Encounter (Signed)
Ok for referral?

## 2019-03-13 NOTE — Telephone Encounter (Signed)
Referral placed.  I called the pt and informed her of this and stated someone will call with appt info.

## 2019-03-19 DIAGNOSIS — G4733 Obstructive sleep apnea (adult) (pediatric): Secondary | ICD-10-CM | POA: Diagnosis not present

## 2019-03-28 ENCOUNTER — Other Ambulatory Visit: Payer: Self-pay

## 2019-03-28 ENCOUNTER — Encounter: Payer: Self-pay | Admitting: Family Medicine

## 2019-03-28 ENCOUNTER — Telehealth (INDEPENDENT_AMBULATORY_CARE_PROVIDER_SITE_OTHER): Payer: BC Managed Care – PPO | Admitting: Family Medicine

## 2019-03-28 VITALS — Temp 97.6°F

## 2019-03-28 DIAGNOSIS — J45991 Cough variant asthma: Secondary | ICD-10-CM

## 2019-03-28 DIAGNOSIS — R519 Headache, unspecified: Secondary | ICD-10-CM

## 2019-03-28 DIAGNOSIS — G43911 Migraine, unspecified, intractable, with status migrainosus: Secondary | ICD-10-CM

## 2019-03-28 DIAGNOSIS — J309 Allergic rhinitis, unspecified: Secondary | ICD-10-CM

## 2019-03-28 MED ORDER — RIZATRIPTAN BENZOATE 10 MG PO TABS: 10.0000 mg | ORAL_TABLET | ORAL | 2 refills | Status: DC | PRN

## 2019-03-28 MED ORDER — BUTALBITAL-APAP-CAFFEINE 50-325-40 MG PO TABS: 1.0000 | ORAL_TABLET | Freq: Two times a day (BID) | ORAL | 0 refills | Status: DC | PRN

## 2019-03-28 NOTE — Progress Notes (Signed)
Virtual Visit via Video Note  I connected with Nicole [name]  on 03/28/19 at 10:00 AM EST by a video enabled telemedicine application and verified that I am speaking with the correct person using two identifiers.  Location patient: home Location provider:work office Persons participating in the virtual visit: patient, provider  I discussed the limitations of evaluation and management by telemedicine and the availability of in person appointments. The patient expressed understanding and agreed to proceed.   Nicole FeltLori J [name] DOB: [birth year] Encounter date: 03/28/2019  This is a 43 y.o. female who presents to establish care. Chief Complaint  Patient presents with  . Establish Care    History of present illness:  Cough variant asthma:  Migraine: has been getting more headaches. Gets headaches 1-2 times/week. Gets about 4 migaines/year. Has been given imitrex in past for migraines - makes her chest feel tight.  Takes a lot of pedialyte to work on hydration. Will take 800mg  ibuprofen when migraine occurs. If unresponsive to that, then will get toradol shot. Last migraine lasted 2-3 days. Gets nausea, light sensitivity, sometimes noise sensitivity. Taking ibuprofen for other headaches during the week. Sometimes uses aleve. Has had increased frequency of headaches for a couple of years. Has been hesitant to say anything because she is scared of financial implications of testing. Start in base of neck like tension headache and then migrate and grow out to side/back of head. No neck pain. No jaw pain. Some popping. No jaw pain in morning.   Allergies: takes zyrtec; overall doing ok with allergies. Sinuses have been better since quitting smoking. Also moved out of apartment that had mold issue; has been in new place since September.   OSA on CPAP: does use cpap nightly.Has order in for new machine.   Irritable bowel: sometimes has diarrhea, then taking lomotil which is helpful (usually just once weekly).  If she takes zofran/when nauseated takes dicyclomine. If she takes zofran and dicyclomine together then will have migraine next day.  Depression: Mood is doing well. Has struggled with depression "all my life"; diagnosed as teen. Was untreated for years; then self medicated with alcohol, drugs has been sober since 2004; relapse In 2006, rehab in 2007. Has done well since 2007. 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 Following with Dr. Evelene CroonKaur since 2006. She was initially put on lexapro and abilify, but made her manic. Felt this contributed to 2006 relapse. Then wellbutrin and abilify until 2018. Then wellbutrin seemed less effective. Now on pristiq and feels she is doing well with this. Now peaks are more plateaud and valleys are not as severe as they used to be.   History of pulmonary nodules, stable CT follow-up with recommendation from last CT in February 2019 for no further follow-up needed.  Mammogram: she would like to do this next year. Wants to do at annual physical.   Last Pap was January 2018 with Dr. Selena BattenKim.  HPV was negative and Pap was negative.  Repeat due in January 2023.    Past Medical History:  Diagnosis Date  . Abnormal pap    w LEEP  . Alcoholism (HCC)   . Allergy   . Asthma   . Blood in stool   . Depression (emotion)   . Frequent headaches   . Hepatitis C, chronic (HCC)    hep c tx successful  . Irritable bowel syndrome   . OSA on CPAP - sees Dr. Jenne CampusMcQueen in ENT 10/09/2013  . Seasonal allergies  scratch test in 2006///mold issue in house   Past Surgical History:  Procedure Laterality Date  . DILATION AND CURETTAGE OF UTERUS  1996, 2001, 2002   x 3  . LEEP  2008  . NASAL FRACTURE SURGERY  1999   repair  . NASAL SEPTOPLASTY W/ TURBINOPLASTY     Allergies  Allergen Reactions  . Cashew Nut Oil Anaphylaxis    Cashews and [place] nuts  . Other Anaphylaxis    Nuts-[place] and cashews  . Cefaclor Rash  . Nickel Rash   Current Meds  Medication Sig  . albuterol  (VENTOLIN HFA) 108 (90 Base) MCG/ACT inhaler Inhale 2 puffs into the lungs every 6 (six) hours as needed for wheezing or shortness of breath.  . ARIPiprazole (ABILIFY) 15 MG tablet Take 15 mg by mouth daily.    . cetirizine (ZYRTEC) 10 MG tablet Take 10 mg by mouth daily.  Marland Kitchen desvenlafaxine (PRISTIQ) 100 MG 24 hr tablet Take 100 mg by mouth daily.  Marland Kitchen dicyclomine (BENTYL) 10 MG capsule Take 1 capsule (10 mg total) by mouth every 8 (eight) hours as needed. (Patient taking differently: Take 10 mg by mouth every 8 (eight) hours as needed for spasms. )  . diphenoxylate-atropine (LOMOTIL) 2.5-0.025 MG tablet Take 1 tablet by mouth 3 (three) times daily as needed for constipation.  Marland Kitchen KURVELO 0.15-30 MG-MCG tablet TAKE 1 TABLET BY MOUTH EVERY DAY  . Multiple Vitamin (MULTIVITAMINS PO) Take 1 tablet by mouth daily.   . ondansetron (ZOFRAN ODT) 8 MG disintegrating tablet Take 1 tablet (8 mg total) by mouth every 8 (eight) hours as needed for nausea or vomiting.   Social History   Tobacco Use  . Smoking status: Former Smoker    Packs/day: 1.50    Years: 15.00    Pack years: 22.50    Types: Cigarettes    Quit date: 05/15/2005    Years since quitting: 13.8  . Smokeless tobacco: Never Used  . Tobacco comment: quit in 2007, smoked for 13 years   Substance Use Topics  . Alcohol use: No    Alcohol/week: 0.0 standard drinks    Comment: quit drinking on 2004---relapse in 2007---QUIT 07/2005   Family History  Problem Relation Age of Onset  . Emphysema Mother   . Colon polyps Mother   . Diverticulitis Mother   . Heart disease Father   . Cancer Father        unsure??  . Hypertension Father   . Hepatitis C Father   . Stroke Maternal Grandmother   . Osteoporosis Maternal Grandmother      Review of Systems  Constitutional: Negative for chills, fatigue and fever.  Respiratory: Negative for cough, chest tightness, shortness of breath and wheezing.   Cardiovascular: Negative for chest pain, palpitations  and leg swelling.  Gastrointestinal: Positive for abdominal pain, diarrhea and nausea.  Neurological: Positive for headaches.     Headaches during week up to 6-7. Most of time dull aches but more severe (about once/week) are in that higher pain scale.   Objective:  Temp 97.6 F (36.4 C) (Temporal)   LMP 03/14/2019 (Approximate)       BP Readings from Last 3 Encounters:  02/20/19 120/70  11/01/18 100/60  09/01/18 122/67   Wt Readings from Last 3 Encounters:  02/20/19 209 lb 8 oz (95 kg)  11/01/18 212 lb (96.2 kg)  08/06/18 208 lb (94.3 kg)    EXAM:  GENERAL: alert, oriented, appears well and in no acute distress  HEENT: atraumatic, conjunctiva clear, no obvious abnormalities on inspection of external nose and ears  NECK: normal movements of the head and neck  LUNGS: on inspection no signs of respiratory distress, breathing rate appears normal, no obvious gross SOB, gasping or wheezing  CV: no obvious cyanosis  MS: moves all visible extremities without noticeable abnormality  PSYCH/NEURO: pleasant and cooperative, no obvious depression or anxiety, speech and thought processing grossly intact  SKIN: no facial or neck abnormalities appreciated.  Assessment/Plan  1. Intractable migraine with status migrainosus, unspecified migraine type Trial maxalt. If not tolerating consider alternative cateogry.  - rizatriptan (MAXALT) 10 MG tablet; Take 1 tablet (10 mg total) by mouth as needed for migraine. May repeat in 2 hours if needed  Dispense: 10 tablet; Refill: 2  2. Cough variant asthma Stable.   3. Allergic rhinitis, unspecified seasonality, unspecified trigger Stable with zyrtec.   4. Nonintractable headache, unspecified chronicity pattern, unspecified headache type Discussed worry for rebound headache with ibuprofen. Try not to take medication if able to avoid; if headache occurs trial fioricet to abort. Let me know if this does not work. Migraine diet, diary sent to  review at next visit. Work on sleep, hydration. - butalbital-acetaminophen-caffeine (FIORICET) 50-325-40 MG tablet; Take 1 tablet by mouth 2 (two) times daily as needed for headache.  Dispense: 20 tablet; Refill: 0   Return in about 1 month (around 04/27/2019) for headache follow up.   I discussed the assessment and treatment plan with the patient. The patient was provided an opportunity to ask questions and all were answered. The patient agreed with the plan and demonstrated an understanding of the instructions.   The patient was advised to call back or seek an in-person evaluation if the symptoms worsen or if the condition fails to improve as anticipated.  I provided 32 minutes of non-face-to-face time during this encounter.   Micheline Rough, MD

## 2019-04-01 ENCOUNTER — Telehealth: Payer: Self-pay | Admitting: *Deleted

## 2019-04-01 NOTE — Telephone Encounter (Signed)
Spoke with the pt and informed her the migraine info will be mailed to her home address.  Appt scheduled for 12/23.

## 2019-04-01 NOTE — Telephone Encounter (Signed)
-----   Message from Caren Macadam, MD sent at 03/28/2019 10:39 AM EST ----- Please mail migraine diet/diary to her; schedule 1 month in office follow up. Ok if slightly longer than 1 month

## 2019-04-09 ENCOUNTER — Encounter (HOSPITAL_COMMUNITY): Payer: Self-pay

## 2019-04-09 ENCOUNTER — Emergency Department (HOSPITAL_COMMUNITY)
Admission: EM | Admit: 2019-04-09 | Discharge: 2019-04-09 | Disposition: A | Discharge status: Home / Self Care | Payer: BC Managed Care – PPO | Attending: Emergency Medicine | Admitting: Emergency Medicine

## 2019-04-09 ENCOUNTER — Other Ambulatory Visit: Payer: Self-pay

## 2019-04-09 ENCOUNTER — Ambulatory Visit: Payer: Self-pay

## 2019-04-09 DIAGNOSIS — Z87891 Personal history of nicotine dependence: Secondary | ICD-10-CM | POA: Diagnosis not present

## 2019-04-09 DIAGNOSIS — G43909 Migraine, unspecified, not intractable, without status migrainosus: Secondary | ICD-10-CM | POA: Insufficient documentation

## 2019-04-09 DIAGNOSIS — Z79899 Other long term (current) drug therapy: Secondary | ICD-10-CM | POA: Insufficient documentation

## 2019-04-09 DIAGNOSIS — T43635A Adverse effect of methylphenidate, initial encounter: Secondary | ICD-10-CM | POA: Diagnosis not present

## 2019-04-09 DIAGNOSIS — T50905A Adverse effect of unspecified drugs, medicaments and biological substances, initial encounter: Secondary | ICD-10-CM | POA: Insufficient documentation

## 2019-04-09 DIAGNOSIS — H53149 Visual discomfort, unspecified: Secondary | ICD-10-CM | POA: Insufficient documentation

## 2019-04-09 DIAGNOSIS — R519 Headache, unspecified: Secondary | ICD-10-CM | POA: Diagnosis present

## 2019-04-09 DIAGNOSIS — R0789 Other chest pain: Secondary | ICD-10-CM | POA: Diagnosis not present

## 2019-04-09 MED ORDER — DEXAMETHASONE SODIUM PHOSPHATE 10 MG/ML IJ SOLN
10.0000 mg | Freq: Once | INTRAMUSCULAR | Status: AC
  Administered 2019-04-09: 10 mg via INTRAVENOUS
  Filled 2019-04-09: qty 1

## 2019-04-09 MED ORDER — SODIUM CHLORIDE 0.9 % IV BOLUS
1000.0000 mL | Freq: Once | INTRAVENOUS | Status: AC
  Administered 2019-04-09: 1000 mL via INTRAVENOUS

## 2019-04-09 MED ORDER — DIPHENHYDRAMINE HCL 50 MG/ML IJ SOLN
25.0000 mg | Freq: Once | INTRAMUSCULAR | Status: AC
  Administered 2019-04-09: 25 mg via INTRAVENOUS
  Filled 2019-04-09: qty 1

## 2019-04-09 MED ORDER — KETOROLAC TROMETHAMINE 30 MG/ML IJ SOLN
30.0000 mg | Freq: Once | INTRAMUSCULAR | Status: AC
  Administered 2019-04-09: 30 mg via INTRAVENOUS
  Filled 2019-04-09: qty 1

## 2019-04-09 MED ORDER — PROCHLORPERAZINE EDISYLATE 10 MG/2ML IJ SOLN
10.0000 mg | Freq: Once | INTRAMUSCULAR | Status: AC
  Administered 2019-04-09: 10 mg via INTRAVENOUS
  Filled 2019-04-09: qty 2

## 2019-04-09 NOTE — ED Triage Notes (Signed)
Pt reports she took Rizatriptan for her migraine around 230pm and states she started to feel tightness in her throat and chest about 30 mins after taking it. Pt a.o, resp e.u, pt able to talk in complete sentences.

## 2019-04-09 NOTE — ED Notes (Signed)
Patient verbalizes understanding of discharge instructions. Opportunity for questioning and answers were provided. Armband removed by staff, pt discharged from ED.  

## 2019-04-09 NOTE — ED Provider Notes (Signed)
[facility] EMERGENCY DEPARTMENT Provider Note   CSN: [id number] Arrival date & time: 04/09/19  1632     History   Chief Complaint Chief Complaint  Patient presents with  . Allergic Reaction  . Headache    HPI Nicole [name] is a 43 y.o. female.     Nicole [name] is a 43 y.o. female with a history of migraines, asthma, hep C, sleep apnea, IBS, who presents to the emergency department with concern for potential allergic reaction as well as headache.  Patient reports she has a history of migraines, and has been having tension type headaches every day for the past 7 days, every other day she has been taking Fioricet to try and treat these headaches without much improvement.  She reports eventually the headache will go away for the most part but every day the headache seems to come back.  She reports this afternoon she noted that the headaches seem to be converting to a migraine she started developing light sensitivity, and sensitivity to sound, as well as some nausea.  Patient reports that she took a dose of rizatriptan for her migraine around 230 and then began to feel some tightness in her throat and chest about 30 minutes after taking it.  Patient reports she had an even more severe but similar reaction to Imitrex which she had severe chest tightness and thought it may be related to that.  Patient reports that the rizatriptan did not help her headache at all and she continues to experience a right-sided migraine that feels like her typical migraine.  She denies any vision changes, facial asymmetry, numbness or weakness.  No other aggravating or alleviating factors.     Past Medical History:  Diagnosis Date  . Abnormal pap    w LEEP  . Alcoholism (HCC)   . Allergy   . Asthma   . Blood in stool   . Depression (emotion)   . Frequent headaches   . Hepatitis C, chronic (HCC)    hep c tx successful  . Irritable bowel syndrome   . OSA on CPAP - sees Dr. Jenne Campus in ENT  10/09/2013  . Seasonal allergies    scratch test in 2006///mold issue in house    Patient Active Problem List   Diagnosis Date Noted  . IBS (irritable bowel syndrome) 05/24/2016  . Chronic venous insufficiency 05/24/2016  . Migraine 05/24/2016  . Pulmonary nodules 10/27/2014  . Hepatitis C, chronic (HCC)   . Depression 10/09/2013  . OSA on CPAP - sees Dr. Jenne Campus in ENT 10/09/2013  . Allergic rhinitis 05/30/2012  . Cough variant asthma 02/08/2012    Past Surgical History:  Procedure Laterality Date  . DILATION AND CURETTAGE OF UTERUS  1996, 2001, 2002   x 3  . LEEP  2008  . NASAL FRACTURE SURGERY  1999   repair  . NASAL SEPTOPLASTY W/ TURBINOPLASTY       OB History    Gravida  3   Para      Term      Preterm      AB  3   Living  0     SAB      TAB  3   Ectopic      Multiple      Live Births               Home Medications    Prior to Admission medications   Medication Sig Start Date End  Date Taking? Authorizing Provider  albuterol (VENTOLIN HFA) 108 (90 Base) MCG/ACT inhaler Inhale 2 puffs into the lungs every 6 (six) hours as needed for wheezing or shortness of breath. 01/20/19   Jannifer Rodney A, FNP  ARIPiprazole (ABILIFY) 15 MG tablet Take 15 mg by mouth daily.      [provider]  butalbital-acetaminophen-caffeine (FIORICET) 50-325-40 MG tablet Take 1 tablet by mouth 2 (two) times daily as needed for headache. 03/28/19 03/27/20  Wynn Banker, MD  cetirizine (ZYRTEC) 10 MG tablet Take 10 mg by mouth daily.    [provider]  desvenlafaxine (PRISTIQ) 100 MG 24 hr tablet Take 100 mg by mouth daily.    [provider]  dicyclomine (BENTYL) 10 MG capsule Take 1 capsule (10 mg total) by mouth every 8 (eight) hours as needed. Patient taking differently: Take 10 mg by mouth every 8 (eight) hours as needed for spasms.  05/28/17   Armbruster, Willaim Rayas, MD  diphenoxylate-atropine (LOMOTIL) 2.5-0.025 MG tablet Take 1 tablet  by mouth 3 (three) times daily as needed for constipation. 06/20/18   [provider]  KURVELO 0.15-30 MG-MCG tablet TAKE 1 TABLET BY MOUTH EVERY DAY 02/24/19   Terressa Koyanagi, DO  Multiple Vitamin (MULTIVITAMINS PO) Take 1 tablet by mouth daily.     [provider]  ondansetron (ZOFRAN ODT) 8 MG disintegrating tablet Take 1 tablet (8 mg total) by mouth every 8 (eight) hours as needed for nausea or vomiting. 05/28/17   Armbruster, Willaim Rayas, MD  rizatriptan (MAXALT) 10 MG tablet Take 1 tablet (10 mg total) by mouth as needed for migraine. May repeat in 2 hours if needed 03/28/19   Wynn Banker, MD    Family History Family History  Problem Relation Age of Onset  . Emphysema Mother   . Colon polyps Mother   . Diverticulitis Mother   . Heart disease Father   . Cancer Father        unsure origin; had spot on lung  . Hypertension Father   . Hepatitis C Father   . Alcohol abuse Father   . Drug abuse Father   . Stroke Maternal Grandmother   . Osteoporosis Maternal Grandmother   . Alcohol abuse Maternal Grandfather   . Liver disease Maternal Grandfather     Social History Social History   Tobacco Use  . Smoking status: Former Smoker    Packs/day: 1.50    Years: 15.00    Pack years: 22.50    Types: Cigarettes    Quit date: 05/15/2005    Years since quitting: 13.9  . Smokeless tobacco: Never Used  . Tobacco comment: quit in 2007, smoked for 13 years   Substance Use Topics  . Alcohol use: No    Alcohol/week: 0.0 standard drinks    Comment: quit drinking on 2004---relapse in 2007---QUIT 07/2005  . Drug use: No    Comment: IV drug use///street drugs///inhalants  QUIT 07/2005     Allergies   Cashew nut oil, Other, Lexapro [escitalopram oxalate], Cefaclor, and Nickel   Review of Systems Review of Systems  Constitutional: Negative for chills and fever.  HENT: Negative for sore throat and trouble swallowing.   Eyes: Positive for photophobia.  Respiratory:  Positive for chest tightness. Negative for cough and shortness of breath.   Gastrointestinal: Positive for nausea. Negative for abdominal pain and vomiting.  Musculoskeletal: Negative for arthralgias and myalgias.  Skin: Negative for color change and rash.  Neurological: Positive for headaches.  Negative for dizziness, facial asymmetry, speech difficulty, weakness, light-headedness and numbness.  All other systems reviewed and are negative.    Physical Exam Updated Vital Signs BP 121/84 (BP Location: Left Arm)   Pulse 90   Temp 98 F (36.7 C) (Oral)   Resp 18   LMP 03/14/2019 (Approximate)   SpO2 98%   Physical Exam Vitals signs and nursing note reviewed.  Constitutional:      General: She is not in acute distress.    Appearance: She is well-developed. She is not diaphoretic.  HENT:     Head: Normocephalic and atraumatic.     Mouth/Throat:     Comments: Posterior oropharynx clear without edema, tolerating secretions, normal phonation Eyes:     General:        Right eye: No discharge.        Left eye: No discharge.     Pupils: Pupils are equal, round, and reactive to light.  Neck:     Musculoskeletal: Neck supple.     Comments: No stridor Cardiovascular:     Rate and Rhythm: Normal rate and regular rhythm.     Heart sounds: Normal heart sounds.  Pulmonary:     Effort: Pulmonary effort is normal. No respiratory distress.     Breath sounds: Normal breath sounds. No wheezing or rales.     Comments: Respirations equal and unlabored, patient able to speak in full sentences, lungs clear to auscultation bilaterally Abdominal:     General: Bowel sounds are normal. There is no distension.     Palpations: Abdomen is soft. There is no mass.     Tenderness: There is no abdominal tenderness. There is no guarding.     Comments: Abdomen soft, nondistended, nontender to palpation in all quadrants without guarding or peritoneal signs  Musculoskeletal:        General: No deformity.   Skin:    General: Skin is warm and dry.     Capillary Refill: Capillary refill takes less than 2 seconds.  Neurological:     Mental Status: She is alert.     GCS: GCS eye subscore is 4. GCS verbal subscore is 5. GCS motor subscore is 6.     Coordination: Coordination normal.     Comments: Speech is clear, able to follow commands CN III-XII intact Normal strength in upper and lower extremities bilaterally including dorsiflexion and plantar flexion, strong and equal grip strength Sensation normal to light and sharp touch Moves extremities without ataxia, coordination intact Normal finger to nose and rapid alternating movements No pronator drift  Psychiatric:        Mood and Affect: Mood normal.        Behavior: Behavior normal.      ED Treatments / Results  Labs (all labs ordered are listed, but only abnormal results are displayed) Labs Reviewed - No data to display  EKG EKG Interpretation  Date/Time:  Wednesday April 09 2019 17:43:37 EST Ventricular Rate:  80 PR Interval:  122 QRS Duration: 88 QT Interval:  392 QTC Calculation: 452 R Axis:   75 Text Interpretation: Normal sinus rhythm Low voltage QRS Nonspecific ST abnormality Abnormal ECG No STEMI Confirmed by Nanda Quinton [id number]) on 04/09/2019 5:56:36 PM   Radiology No results found.  Procedures Procedures (including critical care time)  Medications Ordered in ED Medications  sodium chloride 0.9 % bolus 1,000 mL (1,000 mLs Intravenous New Bag/Given 04/09/19 1728)  ketorolac (TORADOL) 30 MG/ML injection 30 mg (30 mg Intravenous Given 04/09/19  1725)  prochlorperazine (COMPAZINE) injection 10 mg (10 mg Intravenous Given 04/09/19 1724)  diphenhydrAMINE (BENADRYL) injection 25 mg (25 mg Intravenous Given 04/09/19 1724)  dexamethasone (DECADRON) injection 10 mg (10 mg Intravenous Given 04/09/19 1724)     Initial Impression / Assessment and Plan / ED Course  I have reviewed the triage vital signs and the nursing  notes.  Pertinent labs & imaging results that were available during my care of the patient were reviewed by me and considered in my medical decision making (see chart for details).  Presentation is like pts typical HA and non concerning for [facility], ICH, Meningitis, or temporal arteritis.  Patient reports that she has been having recurrent headaches over the past 7 days, she has been using Fioricet and I suspect patient is having rebound headaches, but today this converted to a migraine.  She took Maxalt which caused some throat tightness and chest tightness but she has no facial swelling the posterior oropharynx is clear and she has no stridor to suggest airway compromise and lungs are clear.  Reports she had similar reaction when trying Imitrex.  Pt is afebrile with no focal neuro deficits, nuchal rigidity, or change in vision. Pt HA treated and resolved while in ED. symptoms from medication reaction from Maxalt have resolved as well pt is to follow up with PCP to discuss prophylactic medication, will also refer patient to neurology for continued management of migraines. Pt verbalizes understanding and is agreeable with plan to dc.    Final Clinical Impressions(s) / ED Diagnoses   Final diagnoses:  Migraine without status migrainosus, not intractable, unspecified migraine type  Adverse effect of drug, initial encounter    ED Discharge Orders    None       Legrand RamsFord, Kelsey N, PA-C 04/09/19 1903    Maia PlanLong, Joshua G, MD 04/11/19 1356

## 2019-04-09 NOTE — Telephone Encounter (Signed)
Noted  

## 2019-04-09 NOTE — Telephone Encounter (Signed)
Patient called stating that she has taken a new medication for migraine and is experiencing tight feeling in her throat.  She states that medication is Maxalt. She has has similar reaction with imitrex.  She is breathing and drinking and swallowing liquids. She has not attempted solids. She states symptoms started about 30 minutes after taking the Maxalt. She took the medication about 1-2 hours ago. Sh states that her tongue may be slightly swollen. She states that she takes Pristiq and she now reads that these medication should not be taken together.  Patient will go to ER for evaluation. Care advice read to patient. She verbalized understanding and will follow advice. Reason for Disposition . SEVERE difficulty swallowing (e.g., drooling or spitting, can't swallow water)  Answer Assessment - Initial Assessment Questions 1. SYMPTOM: "Are you having difficulty swallowing liquids, solids, or both?"     Just a tightness in throat 2. ONSET: "When did the swallowing problems begin?"     30 minutes after Maxalt 3. CAUSE: "What do you think is causing the problem?"      Maxalt  4. CHRONIC/RECURRENT: "Is this a new problem for you?"  If no, ask: "How long have you had this problem?" (e.g., days, weeks, months)      Never taken this medication 5. OTHER SYMPTOMS: "Do you have any other symptoms?" (e.g., difficulty breathing, sore throat, swollen tongue, chest pain)    Unsure if tongue is swollen 6. PREGNANCY: "Is there any chance you are pregnant?" "When was your last menstrual period?"    Yesterday birth control  Protocols used: SWALLOWING DIFFICULTY-A-AH

## 2019-04-09 NOTE — Telephone Encounter (Signed)
Patient has arrived at ED.

## 2019-04-09 NOTE — Discharge Instructions (Signed)
Stop using butalbital as this can cause severe rebound headaches, it seems like triptans like rizatriptan and Imitrex do not work well for you either so I would avoid these medications.  Discuss other options for headache medications with your PCP, you can call to schedule follow-up appointment with neurology for further evaluation of your migraines.  Return to the ED for any new or worsening symptoms.

## 2019-04-28 ENCOUNTER — Encounter: Payer: Self-pay | Admitting: Family Medicine

## 2019-04-28 ENCOUNTER — Ambulatory Visit: Payer: BC Managed Care – PPO | Admitting: Neurology

## 2019-04-28 ENCOUNTER — Other Ambulatory Visit: Payer: Self-pay

## 2019-04-28 ENCOUNTER — Encounter: Payer: Self-pay | Admitting: Neurology

## 2019-04-28 ENCOUNTER — Other Ambulatory Visit: Payer: Self-pay | Admitting: Family Medicine

## 2019-04-28 VITALS — Ht 60.0 in | Wt 213.0 lb

## 2019-04-28 DIAGNOSIS — G44209 Tension-type headache, unspecified, not intractable: Secondary | ICD-10-CM

## 2019-04-28 DIAGNOSIS — G43109 Migraine with aura, not intractable, without status migrainosus: Secondary | ICD-10-CM

## 2019-04-28 DIAGNOSIS — G4733 Obstructive sleep apnea (adult) (pediatric): Secondary | ICD-10-CM

## 2019-04-28 MED ORDER — CYCLOBENZAPRINE HCL 10 MG PO TABS: 10.0000 mg | ORAL_TABLET | Freq: Every evening | ORAL | 3 refills | Status: DC | PRN

## 2019-04-28 MED ORDER — UBRELVY 50 MG PO TABS: 50.0000 mg | ORAL_TABLET | ORAL | 3 refills | Status: DC | PRN

## 2019-04-28 NOTE — Telephone Encounter (Signed)
Please cancel [facility] referral as requested. I have placed [facility] referral.

## 2019-04-28 NOTE — Patient Instructions (Addendum)
From what I can see in the available medical records, you have not had a brain MRI and her last head CT was in 2000. As discussed, I will order a brain scan, called MRI and call you with the test results. We will have to schedule you for this on a separate date. This test requires authorization from your insurance, and we will take care of the insurance process. I do believe you have migraines with aura, about once a month per your description.  You also have intermittent headaches that are more In keeping with tension headaches.  For as needed use for tension headaches I suggest Flexeril 10 mg as needed, at bedtime. Side effects may include dizziness, sleepiness, mouth dryness, feeling off balance, so please be mindful. You may not be able to drive after taking it.   For acute use for migraine headaches, start Ubrelvy, 50 mg strength: Take 1 pill at onset of migraine headache, may repeat in 2 hours. May cause sedation (sleepiness) and nausea.   Regarding your sleep apnea, please check with Dr. Ethlyn Gallery regarding the referral to pulmonary sleep specialty.

## 2019-04-28 NOTE — Progress Notes (Signed)
Subjective:    Patient ID: Nicole [name] is a 43 y.o. female.  HPI     Nicole Foley, MD, PhD [facility] [address] [address] [place], [place] [address]  I saw Ms. [facility] as a referral from the ER for migraine headaches. 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  She presented to the emergency room on 04/09/2019 with headaches, she was treated symptomatically with IV fluids, Toradol, Compazine, Benadryl, and Decadron.  She reported throat tightness and chest tightness after taking Maxalt.  She had a similar response to Imitrex in the past.  I reviewed the emergency room records.  She was referred to [facility] last year but never actually had an appointment, canceled twice, from what I can see.  She has not had a brain MRI.  She had a head CT in 2000 which was negative for any acute findings.  She is not aware of any family history of migraines but also reports that she does not know her full family history.  She has been referred to pulmonary sleep medicine for her sleep apnea but has not had a visit yet.  She has an older CPAP machine.  Her weight has been fluctuating, lately she has gained.  She has been working from home since the pandemic, she works as a Radiographer, therapeutic.  She quit smoking in 2007, she tries to hydrate well with water.  She reports a migraine about once a month, she can tell by her visual aura.  She has associated photophobia and sonophobia and nausea, typically no vomiting.  She tried the Maxalt once but got scared after having the chest tightness and  throat tightness.  She was advised not to continue to take the Fioricet for fear of rebound headaches when she went to the ER.  She has not found the Fioricet helpful.  She was also discouraged to use ibuprofen by her primary care physician.  She has other intermittent headaches that started in the back of her head and radiate forward to the temples.  She can tell the difference between those 2 headaches as the migraine headache is often one-sided and throbbing associated with the light sensitivity and nausea and preceded by the aura.  She has noted dehydration as a trigger and tries to hydrate well and also utilizes Pedialyte up to twice a week.  She lives with her husband, they have no children, no pets in the household and she limits her caffeine to 2 cups of coffee per day.  She tries to keep a set sleep schedule and tries to achieve about 8 hours of sleep.  She had an eye examination this year in April and her prescription eyeglasses are up-to-date, her prescription tends to change with every visit.   Her Past Medical History Is Significant For: Past Medical History:  Diagnosis Date  . Abnormal pap    w LEEP  . Alcoholism (HCC)   . Allergy   . Asthma   . Blood in stool   . Depression (emotion)   . Frequent headaches   . Hepatitis C, chronic (HCC)    hep c tx successful  . Irritable  bowel syndrome   . OSA on CPAP - sees Dr. Jenne CampusMcQueen in ENT 10/09/2013  . Seasonal allergies    scratch test in 2006///mold issue in house    Her Past Surgical History Is Significant For: Past Surgical History:  Procedure Laterality Date  . DILATION AND CURETTAGE OF UTERUS  1996, 2001, 2002   x 3  . LEEP  2008  . NASAL FRACTURE SURGERY  1999   repair  . NASAL SEPTOPLASTY W/ TURBINOPLASTY      Her Family History Is Significant For: Family History  Problem Relation Age of Onset  . Emphysema Mother   . Colon polyps Mother   . Diverticulitis Mother   . Heart disease Father   . Cancer Father         unsure origin; had spot on lung  . Hypertension Father   . Hepatitis C Father   . Alcohol abuse Father   . Drug abuse Father   . Stroke Maternal Grandmother   . Osteoporosis Maternal Grandmother   . Alcohol abuse Maternal Grandfather   . Liver disease Maternal Grandfather     Her Social History Is Significant For: Social History   Socioeconomic History  . Marital status: Married    Spouse name: Not on file  . Number of children: 0  . Years of education: Not on file  . Highest education level: Not on file  Occupational History  . Occupation: Adult nursegraphic designer    Employer: [employer]  Tobacco Use  . Smoking status: Former Smoker    Packs/day: 1.50    Years: 15.00    Pack years: 22.50    Types: Cigarettes    Quit date: 05/15/2005    Years since quitting: 13.9  . Smokeless tobacco: Never Used  . Tobacco comment: quit in 2007, smoked for 13 years   Substance and Sexual Activity  . Alcohol use: No    Alcohol/week: 0.0 standard drinks    Comment: quit drinking on 2004---relapse in 2007---QUIT 07/2005  . Drug use: No    Comment: IV drug use///street drugs///inhalants  QUIT 07/2005  . Sexual activity: Not on file  Other Topics Concern  . Not on file  Social History Narrative   Work or School: Glass blower/designergraphic designer      Home Situation: lives with husband      Spiritual Beliefs: none      Lifestyle: no regular exercise; diet is fair               Social Determinants of Corporate investment bankerHealth   Financial Resource Strain:   . Difficulty of Paying Living Expenses: Not on file  Food Insecurity:   . Worried About Programme researcher, broadcasting/film/videounning Out of Food in the Last Year: Not on file  . Ran Out of Food in the Last Year: Not on file  Transportation Needs:   . Lack of Transportation (Medical): Not on file  . Lack of Transportation (Non-Medical): Not on file  Physical Activity:   . Days of Exercise per Week: Not on file  . Minutes of Exercise per Session: Not on file  Stress:   . Feeling of Stress :  Not on file  Social Connections:   . Frequency of Communication with Friends and Family: Not on file  . Frequency of Social Gatherings with Friends and Family: Not on file  . Attends Religious Services: Not on file  . Active Member of Clubs or Organizations: Not on file  . Attends BankerClub or Organization Meetings: Not on  file  . Marital Status: Not on file    Her Allergies Are:  Allergies  Allergen Reactions  . Cashew Nut Oil Anaphylaxis    Cashews and [place] nuts  . Other Anaphylaxis    Nuts-[place] and cashews  . Lexapro [Escitalopram Oxalate]     manic  . Triptans   . Cefaclor Rash  . Nickel Rash  :   Her Current Medications Are:  Outpatient Encounter Medications as of 04/28/2019  Medication Sig  . albuterol (VENTOLIN HFA) 108 (90 Base) MCG/ACT inhaler Inhale 2 puffs into the lungs every 6 (six) hours as needed for wheezing or shortness of breath.  . ARIPiprazole (ABILIFY) 15 MG tablet Take 15 mg by mouth daily.    . cetirizine (ZYRTEC) 10 MG tablet Take 10 mg by mouth daily.  Marland Kitchen desvenlafaxine (PRISTIQ) 100 MG 24 hr tablet Take 100 mg by mouth daily.  Marland Kitchen dicyclomine (BENTYL) 10 MG capsule Take 1 capsule (10 mg total) by mouth every 8 (eight) hours as needed. (Patient taking differently: Take 10 mg by mouth every 8 (eight) hours as needed for spasms. )  . diphenoxylate-atropine (LOMOTIL) 2.5-0.025 MG tablet Take 1 tablet by mouth 3 (three) times daily as needed for constipation.  Marland Kitchen KURVELO 0.15-30 MG-MCG tablet TAKE 1 TABLET BY MOUTH EVERY DAY  . Multiple Vitamin (MULTIVITAMINS PO) Take 1 tablet by mouth daily.   . ondansetron (ZOFRAN ODT) 8 MG disintegrating tablet Take 1 tablet (8 mg total) by mouth every 8 (eight) hours as needed for nausea or vomiting.  . [DISCONTINUED] butalbital-acetaminophen-caffeine (FIORICET) 50-325-40 MG tablet Take 1 tablet by mouth 2 (two) times daily as needed for headache.  . [DISCONTINUED] rizatriptan (MAXALT) 10 MG tablet Take 1 tablet (10 mg total)  by mouth as needed for migraine. May repeat in 2 hours if needed   No facility-administered encounter medications on file as of 04/28/2019.  :   Review of Systems:  Out of a complete 14 point review of systems, all are reviewed and negative with the exception of these symptoms as listed below:  Review of Systems  Neurological:       Pt presents today to discuss her migraines and headaches. Pt gets a migraine about once per month that lasts a day. She has side effects to triptans. Fioricet didn't work well. She also has frequent headaches. Pt has not had a sleep study since 2014. She has a cpap that is 41 years old that reportedly uses every night. She would like a new cpap. Pt uses [facility] as her DME.  Epworth Sleepiness Scale 0= would never doze 1= slight chance of dozing 2= moderate chance of dozing 3= high chance of dozing  Sitting and reading: 2 Watching TV: 2 Sitting inactive in a public place (ex. Theater or meeting): 1 As a passenger in a car for an hour without a break: 0 Lying down to rest in the afternoon: 3 Sitting and talking to someone: 0 Sitting quietly after lunch (no alcohol): 1 In a car, while stopped in traffic: 0 Total: 9     Objective:  Neurological Exam  Physical Exam Physical Examination:   There were no vitals filed for this visit.  General Examination: The patient is a very pleasant 43 y.o. female in no acute distress. She appears well-developed and well-nourished and well groomed.   HEENT: Normocephalic, atraumatic, pupils are equal, round and reactive to light and accommodation. Funduscopic exam is normal with sharp disc margins noted. Extraocular tracking is good without  limitation to gaze excursion or nystagmus noted. Normal smooth pursuit is noted. Hearing is grossly intact. Face is symmetric with normal facial animation and normal facial sensation. Speech is clear with no dysarthria noted. There is no hypophonia. There is no lip, neck/head, jaw or  voice tremor. Neck is supple with full range of passive and active motion. There are no carotid bruits on auscultation. Oropharynx exam reveals: moderate mouth dryness, adequate dental hygiene. Tongue protrudes centrally and palate elevates symmetrically.  Chest: Clear to auscultation without wheezing, rhonchi or crackles noted.  Heart: S1+S2+0, regular and normal without murmurs, rubs or gallops noted.   Abdomen: Soft, non-tender and non-distended with normal bowel sounds appreciated on auscultation.  Extremities: There is no pitting edema in the distal lower extremities bilaterally. Pedal pulses are intact.  Skin: Warm and dry without trophic changes noted.  Musculoskeletal: exam reveals no obvious joint deformities, tenderness or joint swelling or erythema.   Neurologically:  Mental status: The patient is awake, alert and oriented in all 4 spheres. Her immediate and remote memory, attention, language skills and fund of knowledge are appropriate. There is no evidence of aphasia, agnosia, apraxia or anomia. Speech is clear with normal prosody and enunciation. Thought process is linear. Mood is normal and affect is normal.  Cranial nerves II - XII are as described above under HEENT exam. In addition: shoulder shrug is normal with equal shoulder height noted. Motor exam: Normal bulk, strength and tone is noted. There is no drift, tremor or rebound. Romberg is negative. Reflexes are 2+ throughout. Babinski: Toes are flexor bilaterally. Fine motor skills and coordination: intact with normal finger taps, normal hand movements, normal rapid alternating patting, normal foot taps and normal foot agility.  Cerebellar testing: No dysmetria or intention tremor on finger to nose testing. Heel to shin is unremarkable bilaterally. There is no truncal or gait ataxia.  Sensory exam: intact to light touch in the upper and lower extremities.  Gait, station and balance: She stands easily. No veering to one side is  noted. No leaning to one side is noted. Posture is age-appropriate and stance is narrow based. Gait shows normal stride length and normal pace. No problems turning are noted. Tandem walk is unremarkable.                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  She likely has a combination of headaches including tension type headaches which appear to be more frequent at this time and migraine headaches which happen approximately once a month at this point.  She had side effects on 2 different triptan's including Imitrex and Maxalt.  She has not been on a preventative medication before.  I suggested for as needed use for her tension type headaches Flexeril 10 mg strength once daily, preferably at night as it is sedating and she was cautioned regarding this side effect. She has not had a brain MRI, she would benefit from having at least a baseline MRI established at this point.  She has a nonfocal exam which is reassuring.  For migraine management for as needed use I suggested Roselyn Meier  50 mg strength. She was given written instructions and a new Rx.  For sleep apnea management she has been referred to pulmonary sleep by her primary care physician and she is encouraged to talk to her PCP about the referral status.  We talked about headache triggers.  She is encouraged to stay well-hydrated and well rested.  She is advised to follow-up routinely in 3 months to see the nurse practitioner.  I answered all her questions today and she was in agreement.   Nicole Foley, MD, PhD

## 2019-04-29 ENCOUNTER — Telehealth: Payer: Self-pay

## 2019-04-29 NOTE — Telephone Encounter (Signed)
Received PA request for ubrelvy from [facility]. Completed PA via covermymeds. Key: [id number]. [place] approved request from 04/29/19-07/21/19. [facility] notified.

## 2019-05-05 ENCOUNTER — Telehealth: Payer: Self-pay | Admitting: *Deleted

## 2019-05-05 NOTE — Telephone Encounter (Signed)
Spoke with the pt and she stated she has already talked with [facility] and has an appt for a consultation for sleep.

## 2019-05-05 NOTE — Telephone Encounter (Signed)
-----   Message from Caren Macadam, MD sent at 05/04/2019  9:15 AM EST ----- Looks like neurology asked her to check with [place] regarding pulm/sleep study referral. Last notes says they will call her? Please give her their number and let us know if any difficulty with following through with referral. ----- Message ----- From: Star Age, MD Sent: 04/28/2019  11:11 AM EST To: Caren Macadam, MD

## 2019-05-06 ENCOUNTER — Other Ambulatory Visit: Payer: Self-pay

## 2019-05-06 ENCOUNTER — Ambulatory Visit: Payer: BC Managed Care – PPO | Admitting: Internal Medicine

## 2019-05-06 ENCOUNTER — Encounter: Payer: Self-pay | Admitting: Internal Medicine

## 2019-05-06 VITALS — BP 114/70 | HR 110 | Temp 97.2°F | Wt 213.2 lb

## 2019-05-06 DIAGNOSIS — R300 Dysuria: Secondary | ICD-10-CM | POA: Diagnosis not present

## 2019-05-06 DIAGNOSIS — N3001 Acute cystitis with hematuria: Secondary | ICD-10-CM

## 2019-05-06 LAB — POCT URINALYSIS DIPSTICK
Bilirubin, UA: NEGATIVE
Glucose, UA: NEGATIVE
Ketones, UA: NEGATIVE
Nitrite, UA: NEGATIVE
Protein, UA: NEGATIVE
Spec Grav, UA: 1.01 (ref 1.010–1.025)
Urobilinogen, UA: 0.2 E.U./dL
pH, UA: 6 (ref 5.0–8.0)

## 2019-05-06 MED ORDER — SULFAMETHOXAZOLE-TRIMETHOPRIM 800-160 MG PO TABS: 1.0000 | ORAL_TABLET | Freq: Two times a day (BID) | ORAL | 0 refills | Status: AC

## 2019-05-06 NOTE — Patient Instructions (Signed)
-Nice seeing you today!!  -Start Bactrim twice daily for 5 days.   Urinary Tract Infection, Adult A urinary tract infection (UTI) is an infection of any part of the urinary tract. The urinary tract includes:  The kidneys.  The ureters.  The bladder.  The urethra. These organs make, store, and get rid of pee (urine) in the body. What are the causes? This is caused by germs (bacteria) in your genital area. These germs grow and cause swelling (inflammation) of your urinary tract. What increases the risk? You are more likely to develop this condition if:  You have a small, thin tube (catheter) to drain pee.  You cannot control when you pee or poop (incontinence).  You are female, and: ? You use these methods to prevent pregnancy: ? A medicine that kills sperm (spermicide). ? A device that blocks sperm (diaphragm). ? You have low levels of a female hormone (estrogen). ? You are pregnant.  You have genes that add to your risk.  You are sexually active.  You take antibiotic medicines.  You have trouble peeing because of: ? A prostate that is bigger than normal, if you are female. ? A blockage in the part of your body that drains pee from the bladder (urethra). ? A kidney stone. ? A nerve condition that affects your bladder (neurogenic bladder). ? Not getting enough to drink. ? Not peeing often enough.  You have other conditions, such as: ? Diabetes. ? A weak disease-fighting system (immune system). ? Sickle cell disease. ? Gout. ? Injury of the spine. What are the signs or symptoms? Symptoms of this condition include:  Needing to pee right away (urgently).  Peeing often.  Peeing small amounts often.  Pain or burning when peeing.  Blood in the pee.  Pee that smells bad or not like normal.  Trouble peeing.  Pee that is cloudy.  Fluid coming from the vagina, if you are female.  Pain in the belly or lower back. Other symptoms include:  Throwing up  (vomiting).  No urge to eat.  Feeling mixed up (confused).  Being tired and grouchy (irritable).  A fever.  Watery poop (diarrhea). How is this treated? This condition may be treated with:  Antibiotic medicine.  Other medicines.  Drinking enough water. Follow these instructions at home:  Medicines  Take over-the-counter and prescription medicines only as told by your doctor.  If you were prescribed an antibiotic medicine, take it as told by your doctor. Do not stop taking it even if you start to feel better. General instructions  Make sure you: ? Pee until your bladder is empty. ? Do not hold pee for a long time. ? Empty your bladder after sex. ? Wipe from front to back after pooping if you are a female. Use each tissue one time when you wipe.  Drink enough fluid to keep your pee pale yellow.  Keep all follow-up visits as told by your doctor. This is important. Contact a doctor if:  You do not get better after 1-2 days.  Your symptoms go away and then come back. Get help right away if:  You have very bad back pain.  You have very bad pain in your lower belly.  You have a fever.  You are sick to your stomach (nauseous).  You are throwing up. Summary  A urinary tract infection (UTI) is an infection of any part of the urinary tract.  This condition is caused by germs in your genital area.  There are many risk factors for a UTI. These include having a small, thin tube to drain pee and not being able to control when you pee or poop.  Treatment includes antibiotic medicines for germs.  Drink enough fluid to keep your pee pale yellow. This information is not intended to replace advice given to you by your health care provider. Make sure you discuss any questions you have with your health care provider. Document Released: 10/18/2007 Document Revised: 04/18/2018 Document Reviewed: 11/08/2017 Elsevier Patient Education  2020 [employer].

## 2019-05-06 NOTE — Progress Notes (Signed)
Acute Office Visit     This visit occurred during the SARS-CoV-2 public health emergency.  Safety protocols were in place, including screening questions prior to the visit, additional usage of staff PPE, and extensive cleaning of exam room while observing appropriate contact time as indicated for disinfecting solutions.    CC/Reason for Visit: Burning with urination  HPI: Nicole [name] is a 43 y.o. female who is coming in today for the above mentioned reasons.  For today she has been noticing burning with urination.  This first occurred after sexual intercourse.  She has also been noticing urinary frequency and hesitancy.  She denies abdominal pain, flank pain or fever, has noticed a "weird odor" to her urine.   Past Medical/Surgical History: Past Medical History:  Diagnosis Date  . Abnormal pap    w LEEP  . Alcoholism ([place])   . Allergy   . Asthma   . Blood in stool   . Depression (emotion)   . Frequent headaches   . Hepatitis C, chronic (HCC)    hep c tx successful  . Irritable bowel syndrome   . OSA on CPAP - sees Dr. Tami Ribas in ENT 10/09/2013  . Seasonal allergies    scratch test in 2006///mold issue in house    Past Surgical History:  Procedure Laterality Date  . [employer], 2001, 2002   x 3  . LEEP  2008  . NASAL FRACTURE SURGERY  1999   repair  . NASAL SEPTOPLASTY W/ TURBINOPLASTY      Social History:  reports that she quit smoking about 13 years ago. Her smoking use included cigarettes. She has a 22.50 pack-year smoking history. She has never used smokeless tobacco. She reports that she does not drink alcohol or use drugs.  Allergies: Allergies  Allergen Reactions  . Cashew Nut Oil Anaphylaxis    Cashews and [place] nuts  . Other Anaphylaxis    Nuts-[place] and cashews  . Lexapro [Escitalopram Oxalate]     manic  . Triptans   . Cefaclor Rash  . Nickel Rash    Family History:  Family History  Problem Relation Age of  Onset  . Emphysema Mother   . Colon polyps Mother   . Diverticulitis Mother   . Heart disease Father   . Cancer Father        unsure origin; had spot on lung  . Hypertension Father   . Hepatitis C Father   . Alcohol abuse Father   . Drug abuse Father   . Stroke Maternal Grandmother   . Osteoporosis Maternal Grandmother   . Alcohol abuse Maternal Grandfather   . Liver disease Maternal Grandfather      Current Outpatient Medications:  .  albuterol (VENTOLIN HFA) 108 (90 Base) MCG/ACT inhaler, Inhale 2 puffs into the lungs every 6 (six) hours as needed for wheezing or shortness of breath., Disp: 8 g, Rfl: 0 .  ARIPiprazole (ABILIFY) 15 MG tablet, Take 15 mg by mouth daily.  , Disp: , Rfl:  .  cetirizine (ZYRTEC) 10 MG tablet, Take 10 mg by mouth daily., Disp: , Rfl:  .  cyclobenzaprine (FLEXERIL) 10 MG tablet, Take 1 tablet (10 mg total) by mouth at bedtime as needed., Disp: 30 tablet, Rfl: 3 .  desvenlafaxine (PRISTIQ) 100 MG 24 hr tablet, Take 100 mg by mouth daily., Disp: , Rfl:  .  dicyclomine (BENTYL) 10 MG capsule, Take 1 capsule (10 mg total) by  mouth every 8 (eight) hours as needed. (Patient taking differently: Take 10 mg by mouth every 8 (eight) hours as needed for spasms. ), Disp: 90 capsule, Rfl: 1 .  diphenoxylate-atropine (LOMOTIL) 2.5-0.025 MG tablet, Take 1 tablet by mouth 3 (three) times daily as needed for constipation., Disp: , Rfl:  .  KURVELO 0.15-30 MG-MCG tablet, TAKE 1 TABLET BY MOUTH EVERY DAY, Disp: 84 tablet, Rfl: 0 .  Multiple Vitamin (MULTIVITAMINS PO), Take 1 tablet by mouth daily. , Disp: , Rfl:  .  ondansetron (ZOFRAN ODT) 8 MG disintegrating tablet, Take 1 tablet (8 mg total) by mouth every 8 (eight) hours as needed for nausea or vomiting., Disp: 30 tablet, Rfl: 3 .  Ubrogepant (UBRELVY) 50 MG TABS, Take 50 mg by mouth as needed (may repeat once in 2 hours. no more than 2 pills in 24 h.)., Disp: 10 tablet, Rfl: 3 .  sulfamethoxazole-trimethoprim (BACTRIM DS)  800-160 MG tablet, Take 1 tablet by mouth 2 (two) times daily for 5 days., Disp: 10 tablet, Rfl: 0  Review of Systems:  Constitutional: Denies fever, chills, diaphoresis, appetite change and fatigue.  HEENT: Denies photophobia, eye pain, redness, hearing loss, ear pain, congestion, sore throat, rhinorrhea, sneezing, mouth sores, trouble swallowing, neck pain, neck stiffness and tinnitus.   Respiratory: Denies SOB, DOE, cough, chest tightness,  and wheezing.   Cardiovascular: Denies chest pain, palpitations and leg swelling.  Gastrointestinal: Denies nausea, vomiting, abdominal pain, diarrhea, constipation, blood in stool and abdominal distention.  Genitourinary: Positive for dysuria, urgency, frequency, negative for hematuria, flank pain. Endocrine: Denies: hot or cold intolerance, sweats, changes in hair or nails, polyuria, polydipsia. Musculoskeletal: Denies myalgias, back pain, joint swelling, arthralgias and gait problem.  Skin: Denies pallor, rash and wound.  Neurological: Denies dizziness, seizures, syncope, weakness, light-headedness, numbness and headaches.  Hematological: Denies adenopathy. Easy bruising, personal or family bleeding history  Psychiatric/Behavioral: Denies suicidal ideation, mood changes, confusion, nervousness, sleep disturbance and agitation    Physical Exam: Vitals:   05/06/19 1058  BP: 114/70  Pulse: (!) 110  Temp: (!) 97.2 F (36.2 C)  TempSrc: Temporal  SpO2: 94%  Weight: 213 lb 3.2 oz (96.7 kg)    Body mass index is 41.64 kg/m.   Constitutional: NAD, calm, comfortable Eyes: PERRL, lids and conjunctivae normal ENMT: Mucous membranes are moist. .  Abdomen: no tenderness, no masses palpated. No hepatosplenomegaly. Bowel sounds positive.  Musculoskeletal: no clubbing / cyanosis. No joint deformity upper and lower extremities. Good ROM, no contractures. Normal muscle tone.  Skin: no rashes, lesions, ulcers. No induration Neurologic: Grossly intact  and nonfocal.  Psychiatric: Normal judgment and insight. Alert and oriented x 3. Normal mood.    Impression and Plan:  Acute cystitis with hematuria  -Urine dipstick with leukocytes and blood. -Sent for UA and culture. -Treat with Bactrim DS twice daily for 5 days.     Patient Instructions  -Nice seeing you today!!  -Start Bactrim twice daily for 5 days.   Urinary Tract Infection, Adult A urinary tract infection (UTI) is an infection of any part of the urinary tract. The urinary tract includes:  The kidneys.  The ureters.  The bladder.  The urethra. These organs make, store, and get rid of pee (urine) in the body. What are the causes? This is caused by germs (bacteria) in your genital area. These germs grow and cause swelling (inflammation) of your urinary tract. What increases the risk? You are more likely to develop this condition if:  You  have a small, thin tube (catheter) to drain pee.  You cannot control when you pee or poop (incontinence).  You are female, and: ? You use these methods to prevent pregnancy: ? A medicine that kills sperm (spermicide). ? A device that blocks sperm (diaphragm). ? You have low levels of a female hormone (estrogen). ? You are pregnant.  You have genes that add to your risk.  You are sexually active.  You take antibiotic medicines.  You have trouble peeing because of: ? A prostate that is bigger than normal, if you are female. ? A blockage in the part of your body that drains pee from the bladder (urethra). ? A kidney stone. ? A nerve condition that affects your bladder (neurogenic bladder). ? Not getting enough to drink. ? Not peeing often enough.  You have other conditions, such as: ? Diabetes. ? A weak disease-fighting system (immune system). ? Sickle cell disease. ? Gout. ? Injury of the spine. What are the signs or symptoms? Symptoms of this condition include:  Needing to pee right away (urgently).  Peeing  often.  Peeing small amounts often.  Pain or burning when peeing.  Blood in the pee.  Pee that smells bad or not like normal.  Trouble peeing.  Pee that is cloudy.  Fluid coming from the vagina, if you are female.  Pain in the belly or lower back. Other symptoms include:  Throwing up (vomiting).  No urge to eat.  Feeling mixed up (confused).  Being tired and grouchy (irritable).  A fever.  Watery poop (diarrhea). How is this treated? This condition may be treated with:  Antibiotic medicine.  Other medicines.  Drinking enough water. Follow these instructions at home:  Medicines  Take over-the-counter and prescription medicines only as told by your doctor.  If you were prescribed an antibiotic medicine, take it as told by your doctor. Do not stop taking it even if you start to feel better. General instructions  Make sure you: ? Pee until your bladder is empty. ? Do not hold pee for a long time. ? Empty your bladder after sex. ? Wipe from front to back after pooping if you are a female. Use each tissue one time when you wipe.  Drink enough fluid to keep your pee pale yellow.  Keep all follow-up visits as told by your doctor. This is important. Contact a doctor if:  You do not get better after 1-2 days.  Your symptoms go away and then come back. Get help right away if:  You have very bad back pain.  You have very bad pain in your lower belly.  You have a fever.  You are sick to your stomach (nauseous).  You are throwing up. Summary  A urinary tract infection (UTI) is an infection of any part of the urinary tract.  This condition is caused by germs in your genital area.  There are many risk factors for a UTI. These include having a small, thin tube to drain pee and not being able to control when you pee or poop.  Treatment includes antibiotic medicines for germs.  Drink enough fluid to keep your pee pale yellow. This information is not  intended to replace advice given to you by your health care provider. Make sure you discuss any questions you have with your health care provider. Document Released: 10/18/2007 Document Revised: 04/18/2018 Document Reviewed: 11/08/2017 Elsevier Patient Education  2020 Elsevier Inc.      Chaya JanEstela Hernandez Acosta, MD [facility]

## 2019-05-07 ENCOUNTER — Ambulatory Visit: Payer: BC Managed Care – PPO

## 2019-05-07 ENCOUNTER — Ambulatory Visit (INDEPENDENT_AMBULATORY_CARE_PROVIDER_SITE_OTHER): Payer: BC Managed Care – PPO | Admitting: Family Medicine

## 2019-05-07 ENCOUNTER — Encounter: Payer: Self-pay | Admitting: Family Medicine

## 2019-05-07 DIAGNOSIS — G43109 Migraine with aura, not intractable, without status migrainosus: Secondary | ICD-10-CM | POA: Diagnosis not present

## 2019-05-07 DIAGNOSIS — G43909 Migraine, unspecified, not intractable, without status migrainosus: Secondary | ICD-10-CM | POA: Diagnosis not present

## 2019-05-07 DIAGNOSIS — Z309 Encounter for contraceptive management, unspecified: Secondary | ICD-10-CM

## 2019-05-07 DIAGNOSIS — N941 Unspecified dyspareunia: Secondary | ICD-10-CM | POA: Diagnosis not present

## 2019-05-07 DIAGNOSIS — G44209 Tension-type headache, unspecified, not intractable: Secondary | ICD-10-CM | POA: Diagnosis not present

## 2019-05-07 LAB — URINE CULTURE
MICRO NUMBER:: 1222521
SPECIMEN QUALITY:: ADEQUATE

## 2019-05-07 MED ORDER — GADOBENATE DIMEGLUMINE 529 MG/ML IV SOLN
20.0000 mL | Freq: Once | INTRAVENOUS | Status: AC | PRN
  Administered 2019-05-07: 20 mL via INTRAVENOUS

## 2019-05-07 NOTE — Progress Notes (Signed)
Virtual Visit via Video Note  I connected with Nicole [name]  on 05/07/19 at  8:30 AM EST by a video enabled telemedicine application and verified that I am speaking with the correct person using two identifiers.  Location patient: home Location provider:work or home office Persons participating in the virtual visit: patient, provider  I discussed the limitations of evaluation and management by telemedicine and the availability of in person appointments. The patient expressed understanding and agreed to proceed.   Nicole [name] DOB: 1976-02-03 Encounter date: 05/07/2019  This is a 43 y.o. female who presents with Chief Complaint  Patient presents with  . Follow-up    History of present illness: Last visit with me was 03/28/2019.  We discussed migraines at that time.  Migraines comes for days at a time and she has difficulty with getting migraines to fully go away.  We attempted to use Maxalt for migraines since she had an adverse response to Imitrex.  Response to Imitrex is not felt to be allergic but rather intolerance.  However, she ended up going to the emergency room on 11/25 after having a migraine for 7 days and then trying to use the Maxalt which resulted in tightness in the throat (which felt worse than imitrex) and chest.  Medication also did not improve headache.  You are referred to neurology and suggested discussion of prophylactic medication.  Did see neurology on 12/14.  She was given Roselyn Meier to take at onset of migraine.  MRI of brain was also ordered.  This is scheduled for today.  Hasn't had chance to try ubrelvy at this point.   Was also given flexeril by neurology because under impression that headaches are related to muscle tension. Has taken the flexeril along with tylenol when starting to feel neck discomfort.   Has had 16 headaches this month. Most are coming from the exact same area.   Has a cpap machine; feels it needs to be re-calibrated. Climate control is broken.  Feels like she hasn't had good quality sleep, perhaps because of this. For example awoke at 2am this morning and then couldn't get back to sleep. She is now set with Dr. Rexene Alberts for sleep eval 1/4.   Was put on antibiotic for UTI yesterday. Has had two in the last year. Not sure about trigger. Tries to stay well hydrated. Also not holding urine.   Has burning and discomfort during intercourse and feels that this triggers UTI. Not having regular periods; almost non-existent. Interested in IUD for pregnancy prevention and would like to discontinue ocp. Feels like it affects weight.   Allergies  Allergen Reactions  . Cashew Nut Oil Anaphylaxis    Cashews and [place] nuts  . Other Anaphylaxis    Nuts-[place] and cashews  . Lexapro [Escitalopram Oxalate]     manic  . Triptans   . Cefaclor Rash  . Nickel Rash   Current Meds  Medication Sig  . albuterol (VENTOLIN HFA) 108 (90 Base) MCG/ACT inhaler Inhale 2 puffs into the lungs every 6 (six) hours as needed for wheezing or shortness of breath.  . ARIPiprazole (ABILIFY) 15 MG tablet Take 15 mg by mouth daily.    . cetirizine (ZYRTEC) 10 MG tablet Take 10 mg by mouth daily.  . cyclobenzaprine (FLEXERIL) 10 MG tablet Take 1 tablet (10 mg total) by mouth at bedtime as needed.  . desvenlafaxine (PRISTIQ) 100 MG 24 hr tablet Take 100 mg by mouth daily.  Marland Kitchen dicyclomine (BENTYL) 10 MG capsule  Take 1 capsule (10 mg total) by mouth every 8 (eight) hours as needed. (Patient taking differently: Take 10 mg by mouth every 8 (eight) hours as needed for spasms. )  . diphenoxylate-atropine (LOMOTIL) 2.5-0.025 MG tablet Take 1 tablet by mouth 3 (three) times daily as needed for constipation.  Marland Kitchen KURVELO 0.15-30 MG-MCG tablet TAKE 1 TABLET BY MOUTH EVERY DAY  . Multiple Vitamin (MULTIVITAMINS PO) Take 1 tablet by mouth daily.   . ondansetron (ZOFRAN ODT) 8 MG disintegrating tablet Take 1 tablet (8 mg total) by mouth every 8 (eight) hours as needed for nausea or  vomiting.  . sulfamethoxazole-trimethoprim (BACTRIM DS) 800-160 MG tablet Take 1 tablet by mouth 2 (two) times daily for 5 days.  Marland Kitchen Ubrogepant (UBRELVY) 50 MG TABS Take 50 mg by mouth as needed (may repeat once in 2 hours. no more than 2 pills in 24 h.).    Review of Systems  Constitutional: Negative for chills, fatigue and fever.  Respiratory: Negative for cough, chest tightness, shortness of breath and wheezing.   Cardiovascular: Negative for chest pain, palpitations and leg swelling.    Objective:  There were no vitals taken for this visit.      BP Readings from Last 3 Encounters:  05/06/19 114/70  04/09/19 121/84  02/20/19 120/70   Wt Readings from Last 3 Encounters:  05/06/19 213 lb 3.2 oz (96.7 kg)  04/28/19 213 lb (96.6 kg)  02/20/19 209 lb 8 oz (95 kg)    EXAM:  GENERAL: alert, oriented, appears well and in no acute distress  HEENT: atraumatic, conjunctiva clear, no obvious abnormalities on inspection of external nose and ears  NECK: normal movements of the head and neck  LUNGS: on inspection no signs of respiratory distress, breathing rate appears normal, no obvious gross SOB, gasping or wheezing  CV: no obvious cyanosis  MS: moves all visible extremities without noticeable abnormality  PSYCH/NEURO: pleasant and cooperative, no obvious depression or anxiety, speech and thought processing grossly intact  Assessment/Plan   1. Migraine without status migrainosus, not intractable, unspecified migraine type She is tracking migraines and keeping food diary associated with this.  She is going to start also tracking fluid intake.  She has MRI pending today.  She has a follow-up next week with Dr. Frances Furbish to discuss sleep apnea as well.  She has not yet tried Bernita Raisin, but has it at home in case needed.  I will follow along with Dr. Teofilo Pod recommendations.  I did discuss with her working on healthier food options in a lower inflammatory type diet.  Limiting fast food,  processed food.  2.  Contraception management: She is interested in getting an IUD.  I have put in a referral for gynecology to discuss this option.    3.  Dyspareunia: We will need gynecology evaluation.  UTIs may be postcoital.   Return for physical scheduled already.  I discussed the assessment and treatment plan with the patient. The patient was provided an opportunity to ask questions and all were answered. The patient agreed with the plan and demonstrated an understanding of the instructions.   The patient was advised to call back or seek an in-person evaluation if the symptoms worsen or if the condition fails to improve as anticipated.  I provided 23 minutes of non-face-to-face time during this encounter.   Theodis Shove, MD

## 2019-05-19 ENCOUNTER — Encounter: Payer: Self-pay | Admitting: Neurology

## 2019-05-19 ENCOUNTER — Ambulatory Visit (INDEPENDENT_AMBULATORY_CARE_PROVIDER_SITE_OTHER): Payer: Managed Care, Other (non HMO) | Admitting: Neurology

## 2019-05-19 ENCOUNTER — Other Ambulatory Visit: Payer: Self-pay

## 2019-05-19 VITALS — BP 122/79 | HR 98 | Temp 97.3°F | Ht 60.0 in | Wt 216.0 lb

## 2019-05-19 DIAGNOSIS — Z9989 Dependence on other enabling machines and devices: Secondary | ICD-10-CM | POA: Diagnosis not present

## 2019-05-19 DIAGNOSIS — G4733 Obstructive sleep apnea (adult) (pediatric): Secondary | ICD-10-CM | POA: Diagnosis not present

## 2019-05-19 DIAGNOSIS — R519 Headache, unspecified: Secondary | ICD-10-CM

## 2019-05-19 DIAGNOSIS — Z6841 Body Mass Index (BMI) 40.0 and over, adult: Secondary | ICD-10-CM

## 2019-05-19 NOTE — Patient Instructions (Signed)
As discussed, I have ordered a home sleep test, the sleep lab will call you soon to schedule it hopefully.  Once you have had your home sleep test, I can order a new AutoPap machine for your sleep apnea treatment.  You are fully compliant with your current AutoPap, keep up the good work.  We may be able to get a better fitting mask for you once we have your home sleep test results and I can order new equipment and new supplies through a new DME company of your choosing. You will need a follow-up appointment within 90 days after starting your new equipment.  As far as your headaches, you can continue with Flexeril as needed at bedtime and Ubrelvy as needed.

## 2019-05-19 NOTE — Progress Notes (Signed)
Subjective:    Patient ID: Cristela FeltLori J Garciamartinez is a 44 y.o. female.  HPI     Interim history:   Dear Dr. Hassan RowanKoberlein,   I saw Calton DachLori Bethards, upon your kind request in my sleep clinic today for initial consultation of her obstructive sleep apnea.  The patient is unaccompanied today.  I recently saw her on 04/28/2019 for evaluation of her recurrent migraines.  I provided a prescription for Flexeril as needed and Ubrelvy as needed.  As you know, Ms. 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  Prior sleep study results are not available  for my review today.  Her Epworth sleepiness score is 6 out of 24 today.  She is compliant with her current AutoPap machine which is S9 AutoSet.  In the past 90 days she has used her machine is days greater than 4 hours at 97%, indicating excellent compliance, average usage of 8 hours and 41 minutes, dates: 02/19/2019 through 05/19/2019.  Average AHI at goal at 3.6/h, average 95th percentile of pressure at 10.5 cm with a Default range of 4 to 20 cm, leak consistently high with a 95th percentile at 56.3 L/min.  She reports that she is a side sleeper.  Her mask is a Quatro full facemask size small.  She indicates full compliance with her AutoPap machine but she has seen an error message from the machine indicating problems with the motor.  She is doing a little better as far as her headaches, she has been using Flexeril nearly nightly.  She has tried Vanuatubrelvy once for a migraine that started in the evening.  She had no side effects and the next day she had no residual migrainous symptoms.  Her bedtime is  generally between 930 and 10 PM and rise time around 8.  She suspects that her father had sleep apnea, he passed away at 3065.  She denies night to night nocturia.  She had a brain MRI with and without contrast on 05/07/2019 and I reviewed the results: IMPRESSION:    Normal MRI brain (with and without).   The patient's allergies, current medications, family history, past medical history, past social history, past surgical history and problem list were reviewed and updated as appropriate.   Previously:   04/28/2019: (She) reports recurrent headaches/migraines for the past several years.  She presented to the emergency room on 04/09/2019 with headaches, she was treated symptomatically with IV fluids, Toradol, Compazine, Benadryl, and Decadron.  She reported throat tightness and chest tightness after taking Maxalt.  She had a similar response to Imitrex in the past.  I reviewed the emergency room records.  [facility] but never actually had an appointment, canceled twice, from what I can see.  She has not had a brain MRI.  She had a head CT in 2000 which was negative for any acute findings.  She is not aware of any family history of migraines but also reports that she does not know her full family history.  She has been referred to pulmonary sleep medicine for her sleep apnea but has not had a visit yet.  She has an older CPAP machine.  Her weight has been fluctuating, lately she has gained.  She has been working from home since the pandemic, she works as a Air traffic controller.  She quit smoking in 2007, she tries to hydrate well with water.  She reports a migraine about once a month, she can tell by her visual aura.  She has associated photophobia and sonophobia and nausea, typically no vomiting.  She tried the Maxalt once but got scared after having the chest tightness and throat tightness.  She was advised not to continue to take the Fioricet for fear of  rebound headaches when she went to the ER.  She has not found the Fioricet helpful.  She was also discouraged to use ibuprofen by her primary care physician.  She has other intermittent headaches that started in the back of her head and radiate forward to the temples.  She can tell the difference between those 2 headaches as the migraine headache is often one-sided and throbbing associated with the light sensitivity and nausea and preceded by the aura.  She has noted dehydration as a trigger and tries to hydrate well and also utilizes Pedialyte up to twice a week.  She lives with her husband, they have no children, no pets in the household and she limits her caffeine to 2 cups of coffee per day.  She tries to keep a set sleep schedule and tries to achieve about 8 hours of sleep.  She had an eye examination this year in April and her prescription eyeglasses are up-to-date, her prescription tends to change with every visit.   Her Past Medical History Is Significant For: Past Medical History:  Diagnosis Date  . Abnormal pap    w LEEP  . Alcoholism ([place])   . Allergy   . Asthma   . Blood in stool   . Depression (emotion)   . Frequent headaches   . Hepatitis C, chronic (HCC)    hep c tx successful  . Irritable bowel syndrome   . OSA on CPAP - sees Dr. Tami Ribas in ENT 10/09/2013  . Seasonal allergies    scratch test in 2006///mold issue in house    Her Past Surgical History Is Significant For: Past Surgical History:  Procedure Laterality Date  . [place], 2001, 2002   x 3  . LEEP  2008  . NASAL FRACTURE SURGERY  1999   repair  . NASAL SEPTOPLASTY W/ TURBINOPLASTY      Her Family History Is Significant For: Family History  Problem Relation Age of Onset  . Emphysema Mother   . Colon polyps Mother   . Diverticulitis Mother   . Heart disease Father   . Cancer Father        unsure origin; had spot on lung  . Hypertension Father   . Hepatitis C Father   .  Alcohol abuse Father   . Drug abuse Father   . Stroke Maternal Grandmother   . Osteoporosis Maternal Grandmother   . Alcohol abuse Maternal Grandfather   . Liver disease Maternal Grandfather     Her Social History Is Significant For: Social History   Socioeconomic History  . Marital status: Married    Spouse name: Not on file  . Number of children: 0  . Years of education: Not on file  . Highest education level: Not on file  Occupational History  . Occupation: Psychiatrist: [employer]  Tobacco Use  .  Smoking status: Former Smoker    Packs/day: 1.50    Years: 15.00    Pack years: 22.50    Types: Cigarettes    Quit date: 05/15/2005    Years since quitting: 14.0  . Smokeless tobacco: Never Used  . Tobacco comment: quit in 2007, smoked for 13 years   Substance and Sexual Activity  . Alcohol use: No    Alcohol/week: 0.0 standard drinks    Comment: quit drinking on 2004---relapse in 2007---QUIT 07/2005  . Drug use: No    Comment: IV drug use///street drugs///inhalants  QUIT 07/2005  . Sexual activity: Not on file  Other Topics Concern  . Not on file  Social History Narrative   Work or School: Glass blower/designer Situation: lives with husband      Spiritual Beliefs: none      Lifestyle: no regular exercise; diet is fair               Social Determinants of Corporate investment banker Strain:   . Difficulty of Paying Living Expenses: Not on file  Food Insecurity:   . Worried About Programme researcher, broadcasting/film/video in the Last Year: Not on file  . Ran Out of Food in the Last Year: Not on file  Transportation Needs:   . Lack of Transportation (Medical): Not on file  . Lack of Transportation (Non-Medical): Not on file  Physical Activity:   . Days of Exercise per Week: Not on file  . Minutes of Exercise per Session: Not on file  Stress:   . Feeling of Stress : Not on file  Social Connections:   . Frequency of Communication with Friends and  Family: Not on file  . Frequency of Social Gatherings with Friends and Family: Not on file  . Attends Religious Services: Not on file  . Active Member of Clubs or Organizations: Not on file  . Attends Banker Meetings: Not on file  . Marital Status: Not on file    Her Allergies Are:  Allergies  Allergen Reactions  . Cashew Nut Oil Anaphylaxis    Cashews and [place] nuts  . Other Anaphylaxis    Nuts-[place] and cashews  . Lexapro [Escitalopram Oxalate]     manic  . Triptans   . Cefaclor Rash  . Nickel Rash  :   Her Current Medications Are:  Outpatient Encounter Medications as of 05/19/2019  Medication Sig  . albuterol (VENTOLIN HFA) 108 (90 Base) MCG/ACT inhaler Inhale 2 puffs into the lungs every 6 (six) hours as needed for wheezing or shortness of breath.  . ARIPiprazole (ABILIFY) 15 MG tablet Take 15 mg by mouth daily.    . cetirizine (ZYRTEC) 10 MG tablet Take 10 mg by mouth daily.  . cyclobenzaprine (FLEXERIL) 10 MG tablet Take 1 tablet (10 mg total) by mouth at bedtime as needed.  . desvenlafaxine (PRISTIQ) 100 MG 24 hr tablet Take 100 mg by mouth daily.  Marland Kitchen dicyclomine (BENTYL) 10 MG capsule Take 1 capsule (10 mg total) by mouth every 8 (eight) hours as needed. (Patient taking differently: Take 10 mg by mouth every 8 (eight) hours as needed for spasms. )  . diphenoxylate-atropine (LOMOTIL) 2.5-0.025 MG tablet Take 1 tablet by mouth 3 (three) times daily as needed for constipation.  Marland Kitchen KURVELO 0.15-30 MG-MCG tablet TAKE 1 TABLET BY MOUTH EVERY DAY  . Multiple Vitamin (MULTIVITAMINS PO) Take 1 tablet by mouth daily.   . ondansetron (ZOFRAN ODT)  8 MG disintegrating tablet Take 1 tablet (8 mg total) by mouth every 8 (eight) hours as needed for nausea or vomiting.  Marland Kitchen Ubrogepant (UBRELVY) 50 MG TABS Take 50 mg by mouth as needed (may repeat once in 2 hours. no more than 2 pills in 24 h.).   No facility-administered encounter medications on file as of 05/19/2019.   :  Review of Systems:  Out of a complete 14 point review of systems, all are reviewed and negative with the exception of these symptoms as listed below: Review of Systems  Constitutional: Positive for fatigue.  HENT: Negative.   Eyes: Negative.   Respiratory: Negative.   Cardiovascular: Negative.   Gastrointestinal:       Irritable bowel syndrome  Endocrine: Negative.   Genitourinary: Negative.   Musculoskeletal: Positive for neck pain.  Skin: Negative.   Allergic/Immunologic: Positive for food allergies.  Neurological: Positive for headaches. Tremors: nuts.       Epworth Sleepiness Scale 0= would never doze 1= slight chance of dozing 2= moderate chance of dozing 3= high chance of dozing  Sitting and reading:1 Watching TV:1 Sitting inactive in a public place (ex. Theater or meeting):1 As a passenger in a car for an hour without a break:0 Lying down to rest in the afternoon:3 Sitting and talking to someone:0 Sitting quietly after lunch (no alcohol):0 In a car, while stopped in traffic:0 Total:6  Psychiatric/Behavioral: Positive for sleep disturbance.    Objective:  Neurological Exam  Physical Exam Physical Examination:   Vitals:   05/19/19 1522  BP: 122/79  Pulse: 98  Temp: (!) 97.3 F (36.3 C)    General Examination: The patient is a very pleasant 44 y.o. female in no acute distress. She appears well-developed and well-nourished and well groomed.   HEENT: Normocephalic, atraumatic, pupils are equal, round and reactive to light and accommodation. Extraocular tracking is good without limitation to gaze excursion or nystagmus noted. Normal smooth pursuit is noted. Hearing is grossly intact. Face is symmetric with normal facial animation and normal facial sensation. Speech is clear with no dysarthria noted. There is no hypophonia. There is no lip, neck/head, jaw or voice tremor. Neck is supple with full range of passive and active motion. There are no carotid bruits  on auscultation. Oropharynx exam reveals Mild mouth dryness, adequate dental hygiene, tongue protrudes centrally and palate elevates symmetrically, Mallampati class II, small airway entry noted, neck circumference 16-1/8 inches.  Chest: Clear to auscultation without wheezing, rhonchi or crackles noted.  Heart: S1+S2+0, regular and normal without murmurs, rubs or gallops noted.   Abdomen: Soft, non-tender and non-distended with normal bowel sounds appreciated on auscultation.  Extremities: There is no pitting edema in the distal lower extremities bilaterally.  Skin: Warm and dry without trophic changes noted.  Musculoskeletal: exam reveals no obvious joint deformities, tenderness or joint swelling or erythema.   Neurologically:  Mental status: The patient is awake, alert and oriented in all 4 spheres. Her immediate and remote memory, attention, language skills and fund of knowledge are appropriate. There is no evidence of aphasia, agnosia, apraxia or anomia. Speech is clear with normal prosody and enunciation. Thought process is linear. Mood is normal and affect is normal.  Cranial nerves II - XII are as described above under HEENT exam. In addition: shoulder shrug is normal with equal shoulder height noted. Motor exam: Normal bulk, strength and tone is noted. There is no tremor. Fine motor skills and coordination: grossly intact.  Cerebellar testing: No dysmetria or  intention tremor. There is no truncal or gait ataxia.  Sensory exam: intact to light touch in the upper and lower extremities.  Gait, station and balance: She stands easily. No veering to one side is noted. No leaning to one side is noted. Posture is age-appropriate and stance is narrow based. Gait shows normal stride length and pace.                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  She was diagnosed with obstructive sleep apnea over 6 years ago.  She has been on AutoPap therapy and is fully compliant with treatment.  She is commended for her treatment adherence.  She has noticed an error message from the motor and her machine is certainly over 68 years old.  She is advised to proceed with a home sleep test to reestablish her sleep apnea diagnosis and updated.  She will qualify for updated equipment.  She may benefit from changing to a different style of full facemask.  She tried a nasal pillows interface in the beginning but felt like she could not breathe well with it.  She is used to using a full facemask.  She is a side sleeper which can pose a challenge for a good mask seal with a full facemask.She is advised that we will call her to schedule her home sleep test and also call her with her results.  I will be able to prescribe new equipment after that.  She is reminded that she will need a follow-up appointment within 90 days of starting her new equipment.  She is advised to continue to using her current machine for now.  Form headache management, she is advised to continue using her Flexeril as needed and Ubrelvy as needed.  She had a benign brain MRI recently as well.  I answered all her questions today and she was in agreement with the plan.

## 2019-05-22 ENCOUNTER — Other Ambulatory Visit: Payer: Self-pay | Admitting: Family Medicine

## 2019-06-02 ENCOUNTER — Ambulatory Visit (INDEPENDENT_AMBULATORY_CARE_PROVIDER_SITE_OTHER): Payer: Managed Care, Other (non HMO) | Admitting: Neurology

## 2019-06-02 ENCOUNTER — Other Ambulatory Visit: Payer: Self-pay

## 2019-06-02 DIAGNOSIS — Z9989 Dependence on other enabling machines and devices: Secondary | ICD-10-CM

## 2019-06-02 DIAGNOSIS — G4733 Obstructive sleep apnea (adult) (pediatric): Secondary | ICD-10-CM

## 2019-06-02 DIAGNOSIS — Z6841 Body Mass Index (BMI) 40.0 and over, adult: Secondary | ICD-10-CM

## 2019-06-02 DIAGNOSIS — R519 Headache, unspecified: Secondary | ICD-10-CM

## 2019-06-09 ENCOUNTER — Other Ambulatory Visit: Payer: Self-pay

## 2019-06-10 ENCOUNTER — Ambulatory Visit: Payer: Managed Care, Other (non HMO) | Admitting: Women's Health

## 2019-06-10 ENCOUNTER — Encounter: Payer: Self-pay | Admitting: Women's Health

## 2019-06-10 VITALS — BP 124/82 | Ht 60.0 in | Wt 210.0 lb

## 2019-06-10 DIAGNOSIS — N898 Other specified noninflammatory disorders of vagina: Secondary | ICD-10-CM | POA: Diagnosis not present

## 2019-06-10 DIAGNOSIS — Z01419 Encounter for gynecological examination (general) (routine) without abnormal findings: Secondary | ICD-10-CM | POA: Diagnosis not present

## 2019-06-10 DIAGNOSIS — R350 Frequency of micturition: Secondary | ICD-10-CM

## 2019-06-10 DIAGNOSIS — Z3009 Encounter for other general counseling and advice on contraception: Secondary | ICD-10-CM | POA: Diagnosis not present

## 2019-06-10 LAB — WET PREP FOR TRICH, YEAST, CLUE

## 2019-06-10 NOTE — Progress Notes (Signed)
Patient referred by Dr. Hassan Rowan, last seen by me on 05/19/19 for re-eval of her OSA, she has been on an older autoPAP. She had a HST on 06/02/19.    Please call and notify the patient that the recent home sleep test showed obstructive sleep apnea in the severe range, by number of events, but her O2 sats maintained in the 90s. She should qualify for new equipment. She may have a DME company already. I will write for a new autoPAP. She will need a FU in 60-90 d, she already may have an appt pending though.   Huston Foley, MD, PhD [facility])

## 2019-06-10 NOTE — Addendum Note (Signed)
Addended by: Huston Foley on: 06/10/2019 01:33 PM   Modules accepted: Orders

## 2019-06-10 NOTE — Progress Notes (Signed)
Nicole [name] [birth year] [id number]    History:    Presents for annual exam.  Monthly cycle on OCs per primary care but desiring to change to Mirena IUD.  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  Same partner many years denies need for STD screen.  2011 benign breast biopsy, has not had a screening mammogram since.  2006 treated for hepatitis C, history of alcohol abuse.  History of headaches/migraines negative brain MRI.   Past medical history, past surgical history, family history and social history were all reviewed and documented in the EPIC chart.  Works for an Scientist, forensic.  ROS:  A ROS was performed and pertinent positives and negatives are included.  Exam:  Vitals:   06/10/19 1449  BP: 124/82  Weight: 210 lb (95.3 kg)  Height: 5' (1.524 m)   Body mass index is 41.01 kg/m.   General appearance:  Normal Thyroid:  Symmetrical, normal in size, without palpable masses or nodularity. Respiratory  Auscultation:  Clear without wheezing or rhonchi Cardiovascular  Auscultation:  Regular rate, without rubs, murmurs or gallops  Edema/varicosities:  Not grossly evident Abdominal  Soft,nontender, without masses, guarding or rebound.  Liver/spleen:  No organomegaly noted  Hernia:  None appreciated  Skin  Inspection:  Grossly normal   Breasts: Examined lying and sitting.     Right: Without masses, retractions, discharge or axillary adenopathy.     Left: Without masses, retractions, discharge or axillary adenopathy. Gentitourinary   Inguinal/mons:  Normal without inguinal adenopathy  External genitalia:  Normal  BUS/Urethra/Skene's glands:  Normal  Vagina:  Normal, wet prep negative  Cervix:  Normal  Uterus:  normal in size, shape and contour.  Midline and mobile  Adnexa/parametria:     Rt: Without masses or tenderness.   Lt: Without masses or tenderness.  Anus and perineum: Normal  Digital rectal exam: Normal sphincter tone without palpated masses or  tenderness  Assessment/Plan:  44 y.o. S WF G4, P0 for annual exam requesting Mirena IUD with complaint of vaginal odor and mild urinary frequency.  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 Penni Bombard to have placed with menses.  Continue OCs until that time.  SBEs, annual screening mammogram encouraged, breast center information given instructed to schedule.  Aware of importance of decreasing calories and increasing exercise, vitamin D 2000 IUs daily encouraged.  Pap normal with negative HR HPV 2018, new screening guidelines reviewed.  Urine culture pending.    Harrington Challenger [facility], 5:20 PM 06/10/2019

## 2019-06-10 NOTE — Procedures (Signed)
Patient Information     First Name: Nicole Last Name: [name] ID: [id number]  Birth Date: [birth year] Age: 44 Gender: Female  Referring Provider: Caren Macadam, MD BMI: 42.4 (W=216 lb, H=5' 0'')  Neck Circ.:  16 '' Epworth:  6/24   Sleep Study Information    Study Date: Jun 02, 2019 S/H/A Version: 001.001.001.001 / 4.1.1528 / 65  History:    44 year old woman with a history of hepatitis, status post treatment, irritable bowel syndrome, seasonal allergies, asthma, depression, and morbid obesity, who was previously diagnosed with obstructive sleep apnea and placed on PAP therapy. She has been on autoPAP and has an older machine.    Summary & Diagnosis:     OSA  Recommendations:     This home sleep test demonstrates severe obstructive sleep apnea (by number of events) with a total AHI of 35.7/hour and O2 nadir of 92%. The patient will be advised to start autoPAP on a new machine. She has older equipment and should qualify for a new machine. Please note that untreated obstructive sleep apnea may carry additional perioperative morbidity. Patients with significant obstructive sleep apnea should receive perioperative PAP therapy and the surgeons and particularly the anesthesiologist should be informed of the diagnosis and the severity of the sleep disordered breathing. The patient should be cautioned not to drive, work at heights, or operate dangerous or heavy equipment when tired or sleepy. Review and reiteration of good sleep hygiene measures should be pursued with any patient. Other causes of the patient's symptoms, including circadian rhythm disturbances, an underlying mood disorder, medication effect and/or an underlying medical problem cannot be ruled out based on this test. Clinical correlation is recommended. The patient and his referring provider will be notified of the test results. The patient will be seen in follow up in sleep clinic at [facility].  I certify that I have reviewed the raw data  recording prior to the issuance of this report in accordance with the standards of the American Academy of Sleep Medicine (AASM).  Star Age, MD, PhD [facility]) Diplomat, ABPN (Neurology and Sleep)                      Sleep Summary    Oxygen Saturation Statistics     Start Study Time: End Study Time: Total Recording Time:  9:53:34 PM 7:55:30 AM 10 h, 1 min  Total Sleep Time % REM of Sleep Time:  8 h, 15 min  20.1    Mean: 95 Minimum: 92 Maximum: 98  Mean of Desaturations Nadirs (%):   93  Oxygen Desaturation. %: 4-9 10-20 >20 Total  Events Number Total  23 100.0  0 0.0  0 0.0  23 100.0  Oxygen Saturation: <90 <=88 <85 <80 <70  Duration (minutes): Sleep % 0.0 0.0 0.0 0.0 0.0 0.0 0.0 0.0 0.0 0.0     Respiratory Indices      Total Events REM NREM All Night  pRDI:  87  pAHI:  80 ODI:  23  pAHIc:  4  % CSR: 0.0 27.3 27.3 0.0 7.8 40.3 36.8 11.6 1.0 38.8 35.7 10.3 1.8       Pulse Rate Statistics during Sleep (BPM)      Mean:  92 Minimum: 46 Maximum: 126    Indices are calculated using technically valid sleep time of  6 h, 14 min. pRDI/pAHI are calculated using oxi desaturations ? 3%  Body Position Statistics  Position Supine  Prone Right Left Non-Supine  Sleep (min) 477.4 0.0 13.0 5.5 18.5  Sleep % 96.3 0.0 2.6 1.1 3.7  pRDI 37.6 N/A N/A N/A N/A  pAHI 34.4 N/A N/A N/A N/A  ODI 9.2 N/A N/A N/A N/A     Snoring Statistics Snoring Level (dB) >40 >50 >60 >70 >80 >Threshold (45)  Sleep (min) 225.4 33.2 3.2 0.0 0.0 74.3  Sleep % 45.4 6.7 0.6 0.0 0.0 15.0    Mean: 42 dB Sleep Stages Chart                                                      pAHI=35.7                                                                  Mild              Moderate                    Severe                                                 5              15                    30

## 2019-06-10 NOTE — Patient Instructions (Addendum)
Breast center [phone] mammogram Vit D 2000   Health Maintenance, Female Adopting a healthy lifestyle and getting preventive care are important in promoting health and wellness. Ask your health care provider about:  The right schedule for you to have regular tests and exams.  Things you can do on your own to prevent diseases and keep yourself healthy. What should I know about diet, weight, and exercise? Eat a healthy diet   Eat a diet that includes plenty of vegetables, fruits, low-fat dairy products, and lean protein.  Do not eat a lot of foods that are high in solid fats, added sugars, or sodium. Maintain a healthy weight Body mass index (BMI) is used to identify weight problems. It estimates body fat based on height and weight. Your health care provider can help determine your BMI and help you achieve or maintain a healthy weight. Get regular exercise Get regular exercise. This is one of the most important things you can do for your health. Most adults should:  Exercise for at least 150 minutes each week. The exercise should increase your heart rate and make you sweat (moderate-intensity exercise).  Do strengthening exercises at least twice a week. This is in addition to the moderate-intensity exercise.  Spend less time sitting. Even light physical activity can be beneficial. Watch cholesterol and blood lipids Have your blood tested for lipids and cholesterol at 42 years of age, then have this test every 5 years. Have your cholesterol levels checked more often if:  Your lipid or cholesterol levels are high.  You are older than 42 years of age.  You are at high risk for heart disease. What should I know about cancer screening? Depending on your health history and family history, you may need to have cancer screening at various ages. This may include screening for:  Breast cancer.  Cervical cancer.  Colorectal cancer.  Skin cancer.  Lung cancer. What should I know  about heart disease, diabetes, and high blood pressure? Blood pressure and heart disease  High blood pressure causes heart disease and increases the risk of stroke. This is more likely to develop in people who have high blood pressure readings, are of African descent, or are overweight.  Have your blood pressure checked: ? Every 3-5 years if you are 44-35 years of age. ? Every year if you are 43 years old or older. Diabetes Have regular diabetes screenings. This checks your fasting blood sugar level. Have the screening done:  Once every three years after age 62 if you are at a normal weight and have a low risk for diabetes.  More often and at a younger age if you are overweight or have a high risk for diabetes. What should I know about preventing infection? Hepatitis B If you have a higher risk for hepatitis B, you should be screened for this virus. Talk with your health care provider to find out if you are at risk for hepatitis B infection. Hepatitis C Testing is recommended for:  Everyone born from [birth year] through 1963.  Anyone with known risk factors for hepatitis C. Sexually transmitted infections (STIs)  Get screened for STIs, including gonorrhea and chlamydia, if: ? You are sexually active and are younger than 42 years of age. ? You are older than 42 years of age and your health care provider tells you that you are at risk for this type of infection. ? Your sexual activity has changed since you were last screened, and you are at increased risk for  chlamydia or gonorrhea. Ask your health care provider if you are at risk.  Ask your health care provider about whether you are at high risk for HIV. Your health care provider may recommend a prescription medicine to help prevent HIV infection. If you choose to take medicine to prevent HIV, you should first get tested for HIV. You should then be tested every 3 months for as long as you are taking the medicine. Pregnancy  If you are about  to stop having your period (premenopausal) and you may become pregnant, seek counseling before you get pregnant.  Take 400 to 800 micrograms (mcg) of folic acid every day if you become pregnant.  Ask for birth control (contraception) if you want to prevent pregnancy. Osteoporosis and menopause Osteoporosis is a disease in which the bones lose minerals and strength with aging. This can result in bone fractures. If you are 33 years old or older, or if you are at risk for osteoporosis and fractures, ask your health care provider if you should:  Be screened for bone loss.  Take a calcium or vitamin D supplement to lower your risk of fractures.  Be given hormone replacement therapy (HRT) to treat symptoms of menopause. Follow these instructions at home: Lifestyle  Do not use any products that contain nicotine or tobacco, such as cigarettes, e-cigarettes, and chewing tobacco. If you need help quitting, ask your health care provider.  Do not use street drugs.  Do not share needles.  Ask your health care provider for help if you need support or information about quitting drugs. Alcohol use  Do not drink alcohol if: ? Your health care provider tells you not to drink. ? You are pregnant, may be pregnant, or are planning to become pregnant.  If you drink alcohol: ? Limit how much you use to 0-1 drink a day. ? Limit intake if you are breastfeeding.  Be aware of how much alcohol is in your drink. In the [place] one drink equals one 12 oz bottle of beer (355 mL), one 5 oz glass of wine (148 mL), or one 1 oz glass of hard liquor (44 mL). General instructions  Schedule regular health, dental, and eye exams.  Stay current with your vaccines.  Tell your health care provider if: ? You often feel depressed. ? You have ever been abused or do not feel safe at home. Summary  Adopting a healthy lifestyle and getting preventive care are important in promoting health and wellness.  Follow your  health care provider's instructions about healthy diet, exercising, and getting tested or screened for diseases.  Follow your health care provider's instructions on monitoring your cholesterol and blood pressure. This information is not intended to replace advice given to you by your health care provider. Make sure you discuss any questions you have with your health care provider. Document Revised: 04/24/2018 Document Reviewed: 04/24/2018 Elsevier Patient Education  2020 [employer]. Levonorgestrel intrauterine device (IUD) What is this medicine? LEVONORGESTREL IUD (LEE voe nor jes trel) is a contraceptive (birth control) device. The device is placed inside the uterus by a healthcare professional. It is used to prevent pregnancy. This device can also be used to treat heavy bleeding that occurs during your period. This medicine may be used for other purposes; ask your health care provider or pharmacist if you have questions. COMMON BRAND NAME(S): Cameron Ali What should I tell my health care provider before I take this medicine? They need to know if you have any  of these conditions:  abnormal Pap smear  cancer of the breast, uterus, or cervix  diabetes  endometritis  genital or pelvic infection now or in the past  have more than one sexual partner or your partner has more than one partner  heart disease  history of an ectopic or tubal pregnancy  immune system problems  IUD in place  liver disease or tumor  problems with blood clots or take blood-thinners  seizures  use intravenous drugs  uterus of unusual shape  vaginal bleeding that has not been explained  an unusual or allergic reaction to levonorgestrel, other hormones, silicone, or polyethylene, medicines, foods, dyes, or preservatives  pregnant or trying to get pregnant  breast-feeding How should I use this medicine? This device is placed inside the uterus by a health care  professional. Talk to your pediatrician regarding the use of this medicine in children. Special care may be needed. Overdosage: If you think you have taken too much of this medicine contact a poison control center or emergency room at once. NOTE: This medicine is only for you. Do not share this medicine with others. What if I miss a dose? This does not apply. Depending on the brand of device you have inserted, the device will need to be replaced every 3 to 6 years if you wish to continue using this type of birth control. What may interact with this medicine? Do not take this medicine with any of the following medications:  amprenavir  bosentan  fosamprenavir This medicine may also interact with the following medications:  aprepitant  armodafinil  barbiturate medicines for inducing sleep or treating seizures  bexarotene  boceprevir  griseofulvin  medicines to treat seizures like carbamazepine, ethotoin, felbamate, oxcarbazepine, phenytoin, topiramate  modafinil  pioglitazone  rifabutin  rifampin  rifapentine  some medicines to treat HIV infection like atazanavir, efavirenz, indinavir, lopinavir, nelfinavir, tipranavir, ritonavir  St. John's wort  warfarin This list may not describe all possible interactions. Give your health care provider a list of all the medicines, herbs, non-prescription drugs, or dietary supplements you use. Also tell them if you smoke, drink alcohol, or use illegal drugs. Some items may interact with your medicine. What should I watch for while using this medicine? Visit your doctor or health care professional for regular check ups. See your doctor if you or your partner has sexual contact with others, becomes HIV positive, or gets a sexual transmitted disease. This product does not protect you against HIV infection (AIDS) or other sexually transmitted diseases. You can check the placement of the IUD yourself by reaching up to the top of your  vagina with clean fingers to feel the threads. Do not pull on the threads. It is a good habit to check placement after each menstrual period. Call your doctor right away if you feel more of the IUD than just the threads or if you cannot feel the threads at all. The IUD may come out by itself. You may become pregnant if the device comes out. If you notice that the IUD has come out use a backup birth control method like condoms and call your health care provider. Using tampons will not change the position of the IUD and are okay to use during your period. This IUD can be safely scanned with magnetic resonance imaging (MRI) only under specific conditions. Before you have an MRI, tell your healthcare provider that you have an IUD in place, and which type of IUD you have in place. What  side effects may I notice from receiving this medicine? Side effects that you should report to your doctor or health care professional as soon as possible:  allergic reactions like skin rash, itching or hives, swelling of the face, lips, or tongue  fever, flu-like symptoms  genital sores  high blood pressure  no menstrual period for 6 weeks during use  pain, swelling, warmth in the leg  pelvic pain or tenderness  severe or sudden headache  signs of pregnancy  stomach cramping  sudden shortness of breath  trouble with balance, talking, or walking  unusual vaginal bleeding, discharge  yellowing of the eyes or skin Side effects that usually do not require medical attention (report to your doctor or health care professional if they continue or are bothersome):  acne  breast pain  change in sex drive or performance  changes in weight  cramping, dizziness, or faintness while the device is being inserted  headache  irregular menstrual bleeding within first 3 to 6 months of use  nausea This list may not describe all possible side effects. Call your doctor for medical advice about side effects. You  may report side effects to FDA at 1-800-FDA-1088. Where should I keep my medicine? This does not apply. NOTE: This sheet is a summary. It may not cover all possible information. If you have questions about this medicine, talk to your doctor, pharmacist, or health care provider.  2020 Elsevier/Gold Standard (2018-03-12 13:22:01) Hi Kellie Simmering here [facility] gynecology I am not sure if I gave you the breast center information to get a screening mammogram just give them a call around the corner of [address] [place] [phone] thanks

## 2019-06-11 ENCOUNTER — Telehealth: Payer: Self-pay

## 2019-06-11 NOTE — Telephone Encounter (Signed)
Reached out to the pt via my chart in regards to recent sleep study. Pt was advised to let me know if she has a DME of choice and if she had any questions.

## 2019-06-11 NOTE — Telephone Encounter (Signed)
-----   Message from Huston Foley, MD sent at 06/10/2019  1:33 PM EST ----- Patient referred by Dr. Hassan Rowan, last seen by me on 05/19/19 for re-eval of her OSA, she has been on an older autoPAP. She had a HST on 06/02/19.    Please call and notify the patient that the recent home sleep test showed obstructive sleep apnea in the severe range, by number of events, but her O2 sats maintained in the 90s. She should qualify for new equipment. She may have a DME company already. I will write for a new autoPAP. She will need a FU in 60-90 d, she already may have an appt pending though.   Huston Foley, MD, PhD [facility])

## 2019-06-12 LAB — URINALYSIS, COMPLETE W/RFL CULTURE
Bilirubin Urine: NEGATIVE
Glucose, UA: NEGATIVE
Hgb urine dipstick: NEGATIVE
Hyaline Cast: NONE SEEN /LPF
Ketones, ur: NEGATIVE
Leukocyte Esterase: NEGATIVE
Nitrites, Initial: NEGATIVE
Protein, ur: NEGATIVE
Specific Gravity, Urine: 1.015 (ref 1.001–1.03)
pH: 5.5 (ref 5.0–8.0)

## 2019-06-12 LAB — URINE CULTURE
MICRO NUMBER:: 10081570
SPECIMEN QUALITY:: ADEQUATE

## 2019-06-12 LAB — CULTURE INDICATED

## 2019-06-30 ENCOUNTER — Other Ambulatory Visit: Payer: Self-pay

## 2019-07-01 ENCOUNTER — Ambulatory Visit (INDEPENDENT_AMBULATORY_CARE_PROVIDER_SITE_OTHER): Payer: Managed Care, Other (non HMO) | Admitting: Obstetrics & Gynecology

## 2019-07-01 ENCOUNTER — Encounter: Payer: Self-pay | Admitting: Obstetrics & Gynecology

## 2019-07-01 VITALS — BP 122/80

## 2019-07-01 DIAGNOSIS — Z30430 Encounter for insertion of intrauterine contraceptive device: Secondary | ICD-10-CM

## 2019-07-01 NOTE — Progress Notes (Signed)
    Nicole [name] Nov 25, 1975 [id number]        44 y.o.  G4P0040   RP: Mirena IUD insertion  HPI: On BCPs currently.  Difficulties loosing weight, wants to change contraception the the Mirena IUD.   OB History  Gravida Para Term Preterm AB Living  4       4 0  SAB TAB Ectopic Multiple Live Births  1 3          # Outcome Date GA Lbr Len/2nd Weight Sex Delivery Anes PTL Lv  4 SAB           3 TAB           2 TAB           1 TAB             Past medical history,surgical history, problem list, medications, allergies, family history and social history were all reviewed and documented in the EPIC chart.   Directed ROS with pertinent positives and negatives documented in the history of present illness/assessment and plan.  Exam:  Vitals:   07/01/19 1332  BP: 122/80   General appearance:  Normal                                                                    IUD procedure note       Patient presented to the office today for placement of Mirena IUD. The patient had previously been provided with literature information on this method of contraception. The risks benefits and pros and cons were discussed and all her questions were answered. She is fully aware that this form of contraception is 99% effective and is good for 5 years.  Pelvic exam: Vulva normal Vagina: No lesions or discharge Cervix: No lesions or discharge Uterus: AV position Adnexa: No masses or tenderness Rectal exam: Not done  The cervix was cleansed with Betadine solution. Hurricane spray on the cervix.  A single-tooth tenaculum was placed on the anterior cervical lip. The IUD was shown to the patient and inserted in a sterile fashion.  Hysterometry with the IUD as being inserted was 7 cm.  The IUD string was trimmed. The single-tooth tenaculum was removed. Patient was instructed to return back to the office in one month for follow up.        Assessment/Plan:  44 y.o. G4P0040   1. Encounter for IUD  insertion Desire to change from birth control pills to Mirena IUD.  Counseling on Mirena IUD done.  Insertion procedure reviewed.  Easy insertion of Mirena IUD.  No complication.  Well-tolerated by the patient.  Postprocedure precautions reviewed.  Follow-up in 4 weeks for Mirena IUD check.  Genia Del MD, 2:26 PM 07/01/2019

## 2019-07-02 ENCOUNTER — Encounter: Payer: Self-pay | Admitting: Gynecology

## 2019-07-03 ENCOUNTER — Encounter: Payer: Self-pay | Admitting: Obstetrics & Gynecology

## 2019-07-03 ENCOUNTER — Telehealth: Payer: Self-pay

## 2019-07-03 NOTE — Patient Instructions (Signed)
1. Encounter for IUD insertion Desire to change from birth control pills to Mirena IUD.  Counseling on Mirena IUD done.  Insertion procedure reviewed.  Easy insertion of Mirena IUD.  No complication.  Well-tolerated by the patient.  Postprocedure precautions reviewed.  Follow-up in 4 weeks for Mirena IUD check.  Nicole [name], it was a pleasure seeing you today!

## 2019-07-03 NOTE — Telephone Encounter (Signed)
PA for ubrelvy 50 mg has been submitted via cover my meds and received instant approval.   (Key: [id number])  This request has been approved.  Please note any additional information provided by [employer] at the bottom of your screen.

## 2019-07-28 ENCOUNTER — Other Ambulatory Visit: Payer: Self-pay

## 2019-07-28 ENCOUNTER — Ambulatory Visit: Payer: BC Managed Care – PPO | Admitting: Family Medicine

## 2019-07-29 ENCOUNTER — Encounter: Payer: Self-pay | Admitting: Obstetrics & Gynecology

## 2019-07-29 ENCOUNTER — Ambulatory Visit: Payer: Managed Care, Other (non HMO) | Admitting: Obstetrics & Gynecology

## 2019-07-29 VITALS — BP 110/78

## 2019-07-29 DIAGNOSIS — Z30431 Encounter for routine checking of intrauterine contraceptive device: Secondary | ICD-10-CM

## 2019-07-29 NOTE — Patient Instructions (Signed)
1. IUD check up Well on Mirena IUD since insertion 4 weeks ago.  Mirena in good position with no sign of infection.  Patient reassured.  Precautions reviewed.  Follow-up annual gynecologic exam in January 2022.  Nicole [name], it was a pleasure seeing you today!

## 2019-07-29 NOTE — Progress Notes (Signed)
    GIULIANNA ROCHA [birth year] [id number]        44 y.o.  G4P0040   RP: Mirena IUD check 4 weeks after insertion  HPI: Much improved vaginal bleeding on Mirena IUD which was inserted 4 weeks ago.  Currently having minimal breakthrough bleeding.  No pelvic pain.  Normal vaginal secretions.  No fever.  Abstinent since last visit.   OB History  Gravida Para Term Preterm AB Living  4       4 0  SAB TAB Ectopic Multiple Live Births  1 3          # Outcome Date GA Lbr Len/2nd Weight Sex Delivery Anes PTL Lv  4 SAB           3 TAB           2 TAB           1 TAB             Past medical history,surgical history, problem list, medications, allergies, family history and social history were all reviewed and documented in the EPIC chart.   Directed ROS with pertinent positives and negatives documented in the history of present illness/assessment and plan.  Exam:  Vitals:   07/29/19 0959  BP: 110/78   General appearance:  Normal  Abdomen: Normal  Gynecologic exam: Vulva normal.  Speculum: Cervix normal with no erythema.  IUD strings visible at the external os.  Normal vaginal secretions with very mild brown blood.   Assessment/Plan:  44 y.o. G4P0040   1. IUD check up Well on Mirena IUD since insertion 4 weeks ago.  Mirena in good position with no sign of infection.  Patient reassured.  Precautions reviewed.  Follow-up annual gynecologic exam in January 2022.  Genia Del MD, 10:19 AM 07/29/2019

## 2019-08-05 ENCOUNTER — Other Ambulatory Visit: Payer: Self-pay

## 2019-08-05 ENCOUNTER — Ambulatory Visit: Payer: BC Managed Care – PPO | Admitting: Family Medicine

## 2019-08-06 ENCOUNTER — Encounter: Payer: Self-pay | Admitting: Family Medicine

## 2019-08-06 ENCOUNTER — Ambulatory Visit (INDEPENDENT_AMBULATORY_CARE_PROVIDER_SITE_OTHER): Payer: Managed Care, Other (non HMO) | Admitting: Family Medicine

## 2019-08-06 VITALS — BP 118/80 | HR 95 | Temp 97.1°F | Ht 60.75 in | Wt 212.7 lb

## 2019-08-06 DIAGNOSIS — Z0000 Encounter for general adult medical examination without abnormal findings: Secondary | ICD-10-CM

## 2019-08-06 DIAGNOSIS — K589 Irritable bowel syndrome without diarrhea: Secondary | ICD-10-CM | POA: Diagnosis not present

## 2019-08-06 DIAGNOSIS — G43909 Migraine, unspecified, not intractable, without status migrainosus: Secondary | ICD-10-CM

## 2019-08-06 DIAGNOSIS — E785 Hyperlipidemia, unspecified: Secondary | ICD-10-CM | POA: Diagnosis not present

## 2019-08-06 DIAGNOSIS — Z131 Encounter for screening for diabetes mellitus: Secondary | ICD-10-CM | POA: Diagnosis not present

## 2019-08-06 DIAGNOSIS — R002 Palpitations: Secondary | ICD-10-CM | POA: Diagnosis not present

## 2019-08-06 DIAGNOSIS — F339 Major depressive disorder, recurrent, unspecified: Secondary | ICD-10-CM | POA: Diagnosis not present

## 2019-08-06 LAB — COMPREHENSIVE METABOLIC PANEL
ALT: 39 U/L — ABNORMAL HIGH (ref 0–35)
AST: 28 U/L (ref 0–37)
Albumin: 4.3 g/dL (ref 3.5–5.2)
Alkaline Phosphatase: 48 U/L (ref 39–117)
BUN: 11 mg/dL (ref 6–23)
CO2: 25 mEq/L (ref 19–32)
Calcium: 9.2 mg/dL (ref 8.4–10.5)
Chloride: 104 mEq/L (ref 96–112)
Creatinine, Ser: 0.84 mg/dL (ref 0.40–1.20)
GFR: 74.43 mL/min (ref >60.00)
Glucose, Bld: 82 mg/dL (ref 70–99)
Potassium: 4.3 mEq/L (ref 3.5–5.1)
Sodium: 138 mEq/L (ref 135–145)
Total Bilirubin: 0.5 mg/dL (ref 0.2–1.2)
Total Protein: 6.8 g/dL (ref 6.0–8.3)

## 2019-08-06 LAB — MAGNESIUM: Magnesium: 2 mg/dL (ref 1.5–2.5)

## 2019-08-06 LAB — LIPID PANEL
Cholesterol: 183 mg/dL (ref 0–200)
HDL: 47.3 mg/dL (ref >39.00)
NonHDL: 135.43
Total CHOL/HDL Ratio: 4
Triglycerides: 207 mg/dL — ABNORMAL HIGH (ref 0.0–149.0)
VLDL: 41.4 mg/dL — ABNORMAL HIGH (ref 0.0–40.0)

## 2019-08-06 LAB — CBC WITH DIFFERENTIAL/PLATELET
Basophils Absolute: 0.1 10*3/uL (ref 0.0–0.1)
Basophils Relative: 0.7 % (ref 0.0–3.0)
Eosinophils Absolute: 0.1 10*3/uL (ref 0.0–0.7)
Eosinophils Relative: 1.6 % (ref 0.0–5.0)
HCT: 43.3 % (ref 36.0–46.0)
Hemoglobin: 15 g/dL (ref 12.0–15.0)
Lymphocytes Relative: 36.3 % (ref 12.0–46.0)
Lymphs Abs: 3 10*3/uL (ref 0.7–4.0)
MCHC: 34.7 g/dL (ref 30.0–36.0)
MCV: 91 fl (ref 78.0–100.0)
Monocytes Absolute: 0.6 10*3/uL (ref 0.1–1.0)
Monocytes Relative: 7.1 % (ref 3.0–12.0)
Neutro Abs: 4.5 10*3/uL (ref 1.4–7.7)
Neutrophils Relative %: 54.3 % (ref 43.0–77.0)
Platelets: 302 10*3/uL (ref 150.0–400.0)
RBC: 4.76 Mil/uL (ref 3.87–5.11)
RDW: 13.4 % (ref 11.5–15.5)
WBC: 8.3 10*3/uL (ref 4.0–10.5)

## 2019-08-06 LAB — LDL CHOLESTEROL, DIRECT: Direct LDL: 111 mg/dL

## 2019-08-06 LAB — TSH: TSH: 3.42 u[IU]/mL (ref 0.35–4.50)

## 2019-08-06 LAB — HEMOGLOBIN A1C: Hgb A1c MFr Bld: 5.4 % (ref 4.6–6.5)

## 2019-08-06 NOTE — Progress Notes (Signed)
Nicole [name] DOB: 24-May-1975 Encounter date: 08/06/2019  This is a 44 y.o. female who presents for complete physical   History of present illness/Additional concerns:  Follows with Dr. Seymour Bars for gyn needs; Mirena inserted 4 weeks ago. Annual folllow up due 05/2020. Last pap 05/2016: normal, HPV negative. Feels like this is doing well for her. Has a little discharge with this.   ?mammogram: has not had this, but would like to.  Migraines: doing much better overall with migraines. Not getting as often, but has been getting tension headaches. MRI came back ok, but feels that there is muscle tension contributing to symptoms. Flexeril as needed helps. Has follow up in May with neurology.   Sleep apnea: got new cpap in Feb and loves it. States that she is so much more wakeful. Mask seal is great.     Past Medical History:  Diagnosis Date  . Abnormal pap    w LEEP  . Alcoholism (HCC)   . Allergy   . Asthma   . Blood in stool   . Depression (emotion)   . Frequent headaches   . Hepatitis C, chronic (HCC)    hep c tx successful  . Irritable bowel syndrome   . OSA on CPAP - sees Dr. Jenne Campus in ENT 10/09/2013  . Seasonal allergies    scratch test in 2006///mold issue in house   Past Surgical History:  Procedure Laterality Date  . DILATION AND CURETTAGE OF UTERUS  1996, 2001, 2002   x 3  . LEEP  2008  . NASAL FRACTURE SURGERY  1999   repair  . NASAL SEPTOPLASTY W/ TURBINOPLASTY     Allergies  Allergen Reactions  . Cashew Nut Oil Anaphylaxis    Cashews and [place] nuts  . Other Anaphylaxis    Nuts-[place] and cashews  . Triptans Anaphylaxis  . Lexapro [Escitalopram Oxalate]     manic  . Cefaclor Rash  . Nickel Rash   Current Meds  Medication Sig  . albuterol (VENTOLIN HFA) 108 (90 Base) MCG/ACT inhaler Inhale 2 puffs into the lungs every 6 (six) hours as needed for wheezing or shortness of breath.  . ARIPiprazole (ABILIFY) 15 MG tablet Take 15 mg by mouth daily.    . cetirizine  (ZYRTEC) 10 MG tablet Take 10 mg by mouth daily.  . cyclobenzaprine (FLEXERIL) 10 MG tablet Take 1 tablet (10 mg total) by mouth at bedtime as needed.  . desvenlafaxine (PRISTIQ) 100 MG 24 hr tablet Take 100 mg by mouth daily.  Marland Kitchen dicyclomine (BENTYL) 10 MG capsule Take 1 capsule (10 mg total) by mouth every 8 (eight) hours as needed. (Patient taking differently: Take 10 mg by mouth every 8 (eight) hours as needed for spasms. )  . diphenoxylate-atropine (LOMOTIL) 2.5-0.025 MG tablet Take 1 tablet by mouth 3 (three) times daily as needed for constipation.  Marland Kitchen doxepin (SINEQUAN) 10 MG capsule Take 10 mg by mouth.  . levonorgestrel (MIRENA) 20 MCG/24HR IUD 1 each by Intrauterine route once.  . Multiple Vitamin (MULTIVITAMINS PO) Take 1 tablet by mouth daily.   . ondansetron (ZOFRAN ODT) 8 MG disintegrating tablet Take 1 tablet (8 mg total) by mouth every 8 (eight) hours as needed for nausea or vomiting.  Marland Kitchen Ubrogepant (UBRELVY) 50 MG TABS Take 50 mg by mouth as needed (may repeat once in 2 hours. no more than 2 pills in 24 h.).   Social History   Tobacco Use  . Smoking status: Former Smoker  Packs/day: 1.50    Years: 15.00    Pack years: 22.50    Types: Cigarettes    Quit date: 05/15/2005    Years since quitting: 14.2  . Smokeless tobacco: Never Used  . Tobacco comment: quit in 2007, smoked for 13 years   Substance Use Topics  . Alcohol use: No    Alcohol/week: 0.0 standard drinks    Comment: quit drinking on 2004---relapse in 2007---QUIT 07/2005   Family History  Problem Relation Age of Onset  . Emphysema Mother   . Colon polyps Mother   . Diverticulitis Mother   . Heart disease Father   . Cancer Father        unsure origin; had spot on lung  . Hypertension Father   . Hepatitis C Father   . Alcohol abuse Father   . Drug abuse Father   . Stroke Maternal Grandmother   . Osteoporosis Maternal Grandmother   . Alcohol abuse Maternal Grandfather   . Liver disease Maternal Grandfather       Review of Systems  Constitutional: Negative for activity change, appetite change, chills, fatigue, fever and unexpected weight change.  HENT: Negative for congestion, ear pain, hearing loss, sinus pressure, sinus pain, sore throat and trouble swallowing.   Eyes: Negative for pain and visual disturbance.  Respiratory: Negative for cough, chest tightness, shortness of breath and wheezing.   Cardiovascular: Negative for chest pain, palpitations and leg swelling.  Gastrointestinal: Negative for abdominal pain, blood in stool, constipation, diarrhea, nausea and vomiting.  Genitourinary: Negative for difficulty urinating and menstrual problem.  Musculoskeletal: Negative for arthralgias and back pain.  Skin: Negative for rash.  Neurological: Negative for dizziness, weakness, numbness and headaches.  Hematological: Negative for adenopathy. Does not bruise/bleed easily.  Psychiatric/Behavioral: Negative for sleep disturbance and suicidal ideas. The patient is not nervous/anxious.     CBC:  Lab Results  Component Value Date   WBC 8.3 08/06/2019   HGB 15.0 08/06/2019   HCT 43.3 08/06/2019   MCH 31.3 11/01/2018   MCHC 34.7 08/06/2019   RDW 13.4 08/06/2019   PLT 302.0 08/06/2019   MPV 10.0 11/01/2018   CMP: Lab Results  Component Value Date   NA 138 08/06/2019   K 4.3 08/06/2019   CL 104 08/06/2019   CO2 25 08/06/2019   GLUCOSE 82 08/06/2019   BUN 11 08/06/2019   CREATININE 0.84 08/06/2019   CREATININE 0.96 11/01/2018   GFRAA >90 06/21/2011   CALCIUM 9.2 08/06/2019   PROT 6.8 08/06/2019   BILITOT 0.5 08/06/2019   ALKPHOS 48 08/06/2019   ALT 39 (H) 08/06/2019   AST 28 08/06/2019   LIPID: Lab Results  Component Value Date   CHOL 183 08/06/2019   TRIG 207.0 (H) 08/06/2019   HDL 47.30 08/06/2019   LDLCALC 96 06/19/2017    Objective:  BP 118/80 (BP Location: Right Arm, Patient Position: Sitting, Cuff Size: Large)   Pulse 95   Temp (!) 97.1 F (36.2 C) (Temporal)    Ht 5' 0.75" (1.543 m)   Wt 212 lb 11.2 oz (96.5 kg)   SpO2 97%   BMI 40.52 kg/m   Weight: 212 lb 11.2 oz (96.5 kg)   BP Readings from Last 3 Encounters:  08/06/19 118/80  07/29/19 110/78  07/01/19 122/80   Wt Readings from Last 3 Encounters:  08/06/19 212 lb 11.2 oz (96.5 kg)  06/10/19 210 lb (95.3 kg)  05/19/19 216 lb (98 kg)    Physical Exam Constitutional:  General: She is not in acute distress.    Appearance: She is well-developed.  HENT:     Head: Normocephalic and atraumatic.     Right Ear: External ear normal.     Left Ear: External ear normal.     Mouth/Throat:     Pharynx: No oropharyngeal exudate.  Eyes:     Conjunctiva/sclera: Conjunctivae normal.     Pupils: Pupils are equal, round, and reactive to light.  Neck:     Thyroid: No thyromegaly.  Cardiovascular:     Rate and Rhythm: Normal rate and regular rhythm.     Heart sounds: Normal heart sounds. No murmur. No friction rub. No gallop.   Pulmonary:     Effort: Pulmonary effort is normal.     Breath sounds: Normal breath sounds.  Abdominal:     General: Bowel sounds are normal. There is no distension.     Palpations: Abdomen is soft. There is no mass.     Tenderness: There is no abdominal tenderness. There is no guarding.     Hernia: No hernia is present.  Musculoskeletal:        General: No tenderness or deformity. Normal range of motion.     Cervical back: Normal range of motion and neck supple.  Lymphadenopathy:     Cervical: No cervical adenopathy.  Skin:    General: Skin is warm and dry.     Findings: No rash.  Neurological:     Mental Status: She is alert and oriented to person, place, and time.     Deep Tendon Reflexes: Reflexes normal.     Reflex Scores:      Tricep reflexes are 2+ on the right side and 2+ on the left side.      Bicep reflexes are 2+ on the right side and 2+ on the left side.      Brachioradialis reflexes are 2+ on the right side and 2+ on the left side.      Patellar  reflexes are 2+ on the right side and 2+ on the left side. Psychiatric:        Speech: Speech normal.        Behavior: Behavior normal.        Thought Content: Thought content normal.     Assessment/Plan: There are no preventive care reminders to display for this patient. Health Maintenance reviewed.  1. Preventative health care Will order mammogram today, otherwise up-to-date with preventative health care. We discussed regular exercise today and working on healthy eating. She is feeling better overall and has a positive attitude about continuing to work on good self-care. - MM DIGITAL SCREENING BILATERAL; Future  2. Palpitations States that overall these have improved. Notes more at rest and notes more of skipped beat. - CBC with Differential/Platelet; Future - Comprehensive metabolic panel; Future - TSH; Future - Magnesium - TSH - Comprehensive metabolic panel - CBC with Differential/Platelet  3. Episode of recurrent major depressive disorder, unspecified depression episode severity (HCC) Mood has been very stable. Continue her medications. She does follow with Dr.Kaur.  4. Irritable bowel syndrome, unspecified type Bowels are doing very well overall.  5. Migraine without status migrainosus, not intractable, unspecified migraine type Headaches in the doing better. She has following with neurology.  6. Screening for diabetes mellitus - Hemoglobin A1c; Future - Hemoglobin A1c  7. Hyperlipidemia, unspecified hyperlipidemia type - Lipid panel; Future - Lipid panel  Return in about 6 months (around 02/06/2020) for Chronic condition visit.  Jameelah Watts  Hassan Rowan, MD

## 2019-08-22 ENCOUNTER — Ambulatory Visit: Payer: Managed Care, Other (non HMO)

## 2019-09-03 ENCOUNTER — Ambulatory Visit: Payer: Self-pay | Admitting: Neurology

## 2019-09-23 ENCOUNTER — Ambulatory Visit: Payer: 59 | Admitting: Family Medicine

## 2019-09-23 ENCOUNTER — Other Ambulatory Visit: Payer: Self-pay

## 2019-09-23 ENCOUNTER — Encounter: Payer: Self-pay | Admitting: Family Medicine

## 2019-09-23 ENCOUNTER — Telehealth: Payer: Self-pay | Admitting: Family Medicine

## 2019-09-23 VITALS — BP 119/80 | HR 82 | Temp 97.4°F | Ht 60.0 in | Wt 208.0 lb

## 2019-09-23 DIAGNOSIS — Z9989 Dependence on other enabling machines and devices: Secondary | ICD-10-CM

## 2019-09-23 DIAGNOSIS — G43109 Migraine with aura, not intractable, without status migrainosus: Secondary | ICD-10-CM

## 2019-09-23 DIAGNOSIS — G4733 Obstructive sleep apnea (adult) (pediatric): Secondary | ICD-10-CM | POA: Diagnosis not present

## 2019-09-23 MED ORDER — TOPIRAMATE 25 MG PO TABS: 25.0000 mg | ORAL_TABLET | Freq: Every day | ORAL | 3 refills | Status: DC

## 2019-09-23 MED ORDER — NURTEC 75 MG PO TBDP: 75.0000 mg | ORAL_TABLET | Freq: Every day | ORAL | 11 refills | Status: DC | PRN

## 2019-09-23 NOTE — Patient Instructions (Signed)
We will start low dose of topiramate. Take 25mg  at bedtime. We may increase to 50mg  at bedtime if well tolerated. Please let me know if you have any worrisome side effects as discussed, especially depression. Stop Ubrelvy. We will try Nurtec. Take 75mg  once with migraine.   Stay well hydrated. Regular well balanced meals. Regular exercise.   Continue CPAP nightly and greater than 4 hours   Please continue using your CPAP regularly. While your insurance requires that you use CPAP at least 4 hours each night on 70% of the nights, I recommend, that you not skip any nights and use it throughout the night if you can. Getting used to CPAP and staying with the treatment long term does take time and patience and discipline. Untreated obstructive sleep apnea when it is moderate to severe can have an adverse impact on cardiovascular health and raise her risk for heart disease, arrhythmias, hypertension, congestive heart failure, stroke and diabetes. Untreated obstructive sleep apnea causes sleep disruption, nonrestorative sleep, and sleep deprivation. This can have an impact on your day to day functioning and cause daytime sleepiness and impairment of cognitive function, memory loss, mood disturbance, and problems focussing. Using CPAP regularly can improve these symptoms.   Follow up in 3-6 months   Sleep Apnea Sleep apnea affects breathing during sleep. It causes breathing to stop for a short time or to become shallow. It can also increase the risk of:  Heart attack.  Stroke.  Being very overweight (obese).  Diabetes.  Heart failure.  Irregular heartbeat. The goal of treatment is to help you breathe normally again. What are the causes? There are three kinds of sleep apnea:  Obstructive sleep apnea. This is caused by a blocked or collapsed airway.  Central sleep apnea. This happens when the brain does not send the right signals to the muscles that control breathing.  Mixed sleep apnea. This  is a combination of obstructive and central sleep apnea. The most common cause of this condition is a collapsed or blocked airway. This can happen if:  Your throat muscles are too relaxed.  Your tongue and tonsils are too large.  You are overweight.  Your airway is too small. What increases the risk?  Being overweight.  Smoking.  Having a small airway.  Being older.  Being female.  Drinking alcohol.  Taking medicines to calm yourself (sedatives or tranquilizers).  Having family members with the condition. What are the signs or symptoms?  Trouble staying asleep.  Being sleepy or tired during the day.  Getting angry a lot.  Loud snoring.  Headaches in the morning.  Not being able to focus your mind (concentrate).  Forgetting things.  Less interest in sex.  Mood swings.  Personality changes.  Feelings of sadness (depression).  Waking up a lot during the night to pee (urinate).  Dry mouth.  Sore throat. How is this diagnosed?  Your medical history.  A physical exam.  A test that is done when you are sleeping (sleep study). The test is most often done in a sleep lab but may also be done at home. How is this treated?   Sleeping on your side.  Using a medicine to get rid of mucus in your nose (decongestant).  Avoiding the use of alcohol, medicines to help you relax, or certain pain medicines (narcotics).  Losing weight, if needed.  Changing your diet.  Not smoking.  Using a machine to open your airway while you sleep, such as: ? An oral  appliance. This is a mouthpiece that shifts your lower jaw forward. ? A CPAP device. This device blows air through a mask when you breathe out (exhale). ? An EPAP device. This has valves that you put in each nostril. ? A BPAP device. This device blows air through a mask when you breathe in (inhale) and breathe out.  Having surgery if other treatments do not work. It is important to get treatment for sleep  apnea. Without treatment, it can lead to:  High blood pressure.  Coronary artery disease.  In men, not being able to have an erection (impotence).  Reduced thinking ability. Follow these instructions at home: Lifestyle  Make changes that your doctor recommends.  Eat a healthy diet.  Lose weight if needed.  Avoid alcohol, medicines to help you relax, and some pain medicines.  Do not use any products that contain nicotine or tobacco, such as cigarettes, e-cigarettes, and chewing tobacco. If you need help quitting, ask your doctor. General instructions  Take over-the-counter and prescription medicines only as told by your doctor.  If you were given a machine to use while you sleep, use it only as told by your doctor.  If you are having surgery, make sure to tell your doctor you have sleep apnea. You may need to bring your device with you.  Keep all follow-up visits as told by your doctor. This is important. Contact a doctor if:  The machine that you were given to use during sleep bothers you or does not seem to be working.  You do not get better.  You get worse. Get help right away if:  Your chest hurts.  You have trouble breathing in enough air.  You have an uncomfortable feeling in your back, arms, or stomach.  You have trouble talking.  One side of your body feels weak.  A part of your face is hanging down. These symptoms may be an emergency. Do not wait to see if the symptoms will go away. Get medical help right away. Call your local emergency services (911 in the [place] Do not drive yourself to the hospital. Summary  This condition affects breathing during sleep.  The most common cause is a collapsed or blocked airway.  The goal of treatment is to help you breathe normally while you sleep. This information is not intended to replace advice given to you by your health care provider. Make sure you discuss any questions you have with your health care provider.  Document Revised: 02/15/2018 Document Reviewed: 12/25/2017 Elsevier Patient Education  2020 [employer].   Migraine Headache A migraine headache is a very strong throbbing pain on one side or both sides of your head. This type of headache can also cause other symptoms. It can last from 4 hours to 3 days. Talk with your doctor about what things may bring on (trigger) this condition. What are the causes? The exact cause of this condition is not known. This condition may be triggered or caused by:  Drinking alcohol.  Smoking.  Taking medicines, such as: ? Medicine used to treat chest pain (nitroglycerin). ? Birth control pills. ? Estrogen. ? Some blood pressure medicines.  Eating or drinking certain products.  Doing physical activity. Other things that may trigger a migraine headache include:  Having a menstrual period.  Pregnancy.  Hunger.  Stress.  Not getting enough sleep or getting too much sleep.  Weather changes.  Tiredness (fatigue). What increases the risk?  Being 49-4 years old.  Being female.  Having a family history of migraine headaches.  Being Caucasian.  Having depression or anxiety.  Being very overweight. What are the signs or symptoms?  A throbbing pain. This pain may: ? Happen in any area of the head, such as on one side or both sides. ? Make it hard to do daily activities. ? Get worse with physical activity. ? Get worse around bright lights or loud noises.  Other symptoms may include: ? Feeling sick to your stomach (nauseous). ? Vomiting. ? Dizziness. ? Being sensitive to bright lights, loud noises, or smells.  Before you get a migraine headache, you may get warning signs (an aura). An aura may include: ? Seeing flashing lights or having blind spots. ? Seeing bright spots, halos, or zigzag lines. ? Having tunnel vision or blurred vision. ? Having numbness or a tingling feeling. ? Having trouble talking. ? Having weak muscles.   Some people have symptoms after a migraine headache (postdromal phase), such as: ? Tiredness. ? Trouble thinking (concentrating). How is this treated?  Taking medicines that: ? Relieve pain. ? Relieve the feeling of being sick to your stomach. ? Prevent migraine headaches.  Treatment may also include: ? Having acupuncture. ? Avoiding foods that bring on migraine headaches. ? Learning ways to control your body functions (biofeedback). ? Therapy to help you know and deal with negative thoughts (cognitive behavioral therapy). Follow these instructions at home: Medicines  Take over-the-counter and prescription medicines only as told by your doctor.  Ask your doctor if the medicine prescribed to you: ? Requires you to avoid driving or using heavy machinery. ? Can cause trouble pooping (constipation). You may need to take these steps to prevent or treat trouble pooping:  Drink enough fluid to keep your pee (urine) pale yellow.  Take over-the-counter or prescription medicines.  Eat foods that are high in fiber. These include beans, whole grains, and fresh fruits and vegetables.  Limit foods that are high in fat and sugar. These include fried or sweet foods. Lifestyle  Do not drink alcohol.  Do not use any products that contain nicotine or tobacco, such as cigarettes, e-cigarettes, and chewing tobacco. If you need help quitting, ask your doctor.  Get at least 8 hours of sleep every night.  Limit and deal with stress. General instructions      Keep a journal to find out what may bring on your migraine headaches. For example, write down: ? What you eat and drink. ? How much sleep you get. ? Any change in what you eat or drink. ? Any change in your medicines.  If you have a migraine headache: ? Avoid things that make your symptoms worse, such as bright lights. ? It may help to lie down in a dark, quiet room. ? Do not drive or use heavy machinery. ? Ask your doctor what  activities are safe for you.  Keep all follow-up visits as told by your doctor. This is important. Contact a doctor if:  You get a migraine headache that is different or worse than others you have had.  You have more than 15 headache days in one month. Get help right away if:  Your migraine headache gets very bad.  Your migraine headache lasts longer than 72 hours.  You have a fever.  You have a stiff neck.  You have trouble seeing.  Your muscles feel weak or like you cannot control them.  You start to lose your balance a lot.  You start to have  trouble walking.  You pass out (faint).  You have a seizure. Summary  A migraine headache is a very strong throbbing pain on one side or both sides of your head. These headaches can also cause other symptoms.  This condition may be treated with medicines and changes to your lifestyle.  Keep a journal to find out what may bring on your migraine headaches.  Contact a doctor if you get a migraine headache that is different or worse than others you have had.  Contact your doctor if you have more than 15 headache days in a month. This information is not intended to replace advice given to you by your health care provider. Make sure you discuss any questions you have with your health care provider. Document Revised: 08/23/2018 Document Reviewed: 06/13/2018 Elsevier Patient Education  [place].

## 2019-09-23 NOTE — Telephone Encounter (Signed)
Pt called wanting to know if her topiramate could be called in to a different pharmacy as her pharmacy is charging $150 for medication  Pharmacy: [facility] [place]

## 2019-09-23 NOTE — Progress Notes (Addendum)
PATIENT: Nicole [name] DOB: Aug 21, 1975  REASON FOR VISIT: follow up HISTORY FROM: patient  Chief Complaint  Patient presents with  . Follow-up    rm 1, alone,   . Migraine    pt states migraines have been worse, 5 headaches within the last 7 days      HISTORY OF PRESENT ILLNESS: Today 09/23/19 Nicole [name] is a 44 y.o. female here today for follow up for obstructive sleep apnea on CPAP as well as migraines she reports doing very well on CPAP therapy.  She is using CPAP nightly and for greater than 4 hours each night.  She denies any concerns with her mask or supplies.  She does note improved sleep quality with using CPAP.  She also feels that frequency of headaches has reduced slightly but she continues to have regular, debilitating headaches.  She has taken [place] but does not feel that its effective.  She is also used over-the-counter analgesics on occasion but denies regular use.  She reports having at least 10-12 headache days per month.  She feels that 3-4 of these are migrainous.  She is interested in considering a preventative medication.  She does have a significant history of depression but feels that she is very well managed on current treatment plan.  She is seen regularly by psychiatry.  She also has a history of asthma but states that this is mild intermittent and she rarely uses Ventolin.  She denies any history of kidney stones.  Compliance report dated 08/23/2019 through 09/21/2019 reveals that she has used CPAP every night for compliance of 100%.  She is used CPAP greater than 4 hours every night for compliance of 100%.  Residual AHI is 5.0 on 6 to 12 cm of water and an EPR of 3.  There was no significant leak noted.  HISTORY: (copied from Dr Guadelupe Sabin note on 05/19/2019)  Dear Dr. Ethlyn Gallery,   I saw [name], upon your kind request in my sleep clinic today for initial consultation of her obstructive sleep apnea.  The patient is unaccompanied today.  I recently saw her on  04/28/2019 for evaluation of her recurrent migraines.  I provided a prescription for Flexeril as needed and Ubrelvy as needed.  As you know, Ms. 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  Prior sleep study results are not available  for my review today.  Her Epworth sleepiness score is 6 out of 24 today.  She is compliant with her current AutoPap machine which is S9 AutoSet.  In the past 90 days she has used her machine is days greater than 4 hours at 97%, indicating excellent compliance, average usage of 8 hours and 41 minutes, dates: 02/19/2019 through 05/19/2019.  Average AHI at goal at 3.6/h, average 95th percentile of pressure at 10.5 cm with a Default range of 4 to 20 cm, leak consistently high with a 95th percentile at 56.3 L/min.  She reports that she is a side sleeper.  Her mask is a Quatro full facemask size small.  She indicates full compliance with her AutoPap machine but she has seen an error message from the machine indicating problems with the motor.  She  is doing a little better as far as her headaches, she has been using Flexeril nearly nightly.  She has tried [place] once for a migraine that started in the evening.  She had no side effects and the next day she had no residual migrainous symptoms.  Her bedtime is generally between 930 and 10 PM and rise time around 8.  She suspects that her father had sleep apnea, he passed away at 3.  She denies night to night nocturia.  She had a brain MRI with and without contrast on 05/07/2019 and I reviewed the results: IMPRESSION:   Normal MRI brain (with and  without).   The patient's allergies, current medications, family history, past medical history, past social history, past surgical history and problem list were reviewed and updated as appropriate.   Previously:   04/28/2019: (She) reports recurrent headaches/migraines for the past several years. She presented to the emergency room on 04/09/2019 with headaches, she was treated symptomatically with IV fluids, Toradol, Compazine, Benadryl, and Decadron. She reported throat tightness and chest tightness after taking Maxalt. She had a similar response to Imitrex in the past. I reviewed the emergency room records. She was referred to [facility] last year but never actually had an appointment, canceled twice, from what I can see.  She has not had a brain MRI. She had a head CT in 2000 which was negative for any acute findings. She is not aware of any family history of migraines but also reports that she does not know her full family history. She has been referred to pulmonary sleep medicine for her sleep apnea but has not had a visit yet. She has an older CPAP machine. Her weight has been fluctuating, lately she has gained. She has been working from home since the pandemic, she works as a Radiographer, therapeutic. She quit smoking in 2007, she tries to hydrate well with water. She reports a migraine about once a month, she can tell by her visual aura. She has associated photophobia and sonophobia and nausea, typically no vomiting. She tried the Maxalt once but got scared after having the chest tightness and throat tightness. She was advised not to continue to take the Fioricet for fear of rebound headaches when she went to the ER. She has not found the Fioricet helpful. She was also discouraged to use ibuprofen by her primary care physician. She has other intermittent headaches that started in the back of her head and radiate forward to the temples. She can tell the difference  between those 2 headaches as the migraine headache is often one-sided and throbbing associated with the light sensitivity and nausea and preceded by the aura. She has noted dehydration as a trigger and tries to hydrate well and also utilizes Pedialyte up to twice a week. She lives with her husband, they have no children, no pets in the household and she limits her caffeine to 2 cups of coffee per day. She tries to keep a set sleep schedule and tries to achieve about 8 hours of sleep. She had an eye examination this year in April and her prescription eyeglasses are up-to-date, her prescription tends to change with every visit.  REVIEW OF SYSTEMS: Out of a complete 14 system review of symptoms, the patient complains only of the following symptoms, headaches, depression and all other reviewed systems are negative.  ALLERGIES: Allergies  Allergen Reactions  . Cashew Nut Oil Anaphylaxis    Cashews and [place] nuts  . Other Anaphylaxis  Nuts-[place] and cashews  . Triptans Anaphylaxis  . Lexapro [Escitalopram Oxalate]     manic  . Cefaclor Rash  . Nickel Rash    HOME MEDICATIONS: Outpatient Medications Prior to Visit  Medication Sig Dispense Refill  . albuterol (VENTOLIN HFA) 108 (90 Base) MCG/ACT inhaler Inhale 2 puffs into the lungs every 6 (six) hours as needed for wheezing or shortness of breath. 8 g 0  . ARIPiprazole (ABILIFY) 15 MG tablet Take 15 mg by mouth daily.      . cetirizine (ZYRTEC) 10 MG tablet Take 10 mg by mouth daily.    . cyclobenzaprine (FLEXERIL) 10 MG tablet Take 1 tablet (10 mg total) by mouth at bedtime as needed. 30 tablet 3  . desvenlafaxine (PRISTIQ) 100 MG 24 hr tablet Take 100 mg by mouth daily.    Marland Kitchen dicyclomine (BENTYL) 10 MG capsule Take 1 capsule (10 mg total) by mouth every 8 (eight) hours as needed. (Patient taking differently: Take 10 mg by mouth every 8 (eight) hours as needed for spasms. ) 90 capsule 1  . diphenoxylate-atropine (LOMOTIL) 2.5-0.025 MG  tablet Take 1 tablet by mouth 3 (three) times daily as needed for constipation.    Marland Kitchen doxepin (SINEQUAN) 10 MG capsule Take 10 mg by mouth.    . levonorgestrel (MIRENA) 20 MCG/24HR IUD 1 each by Intrauterine route once.    . Multiple Vitamin (MULTIVITAMINS PO) Take 1 tablet by mouth daily.     . ondansetron (ZOFRAN ODT) 8 MG disintegrating tablet Take 1 tablet (8 mg total) by mouth every 8 (eight) hours as needed for nausea or vomiting. 30 tablet 3  . Ubrogepant (UBRELVY) 50 MG TABS Take 50 mg by mouth as needed (may repeat once in 2 hours. no more than 2 pills in 24 h.). 10 tablet 3   No facility-administered medications prior to visit.    PAST MEDICAL HISTORY: Past Medical History:  Diagnosis Date  . Abnormal pap    w LEEP  . Alcoholism (HCC)   . Allergy   . Asthma   . Blood in stool   . Depression (emotion)   . Frequent headaches   . Hepatitis C, chronic (HCC)    hep c tx successful  . Irritable bowel syndrome   . OSA on CPAP - sees Dr. Jenne Campus in ENT 10/09/2013  . Seasonal allergies    scratch test in 2006///mold issue in house    PAST SURGICAL HISTORY: Past Surgical History:  Procedure Laterality Date  . DILATION AND CURETTAGE OF UTERUS  1996, 2001, 2002   x 3  . LEEP  2008  . NASAL FRACTURE SURGERY  1999   repair  . NASAL SEPTOPLASTY W/ TURBINOPLASTY      FAMILY HISTORY: Family History  Problem Relation Age of Onset  . Emphysema Mother   . Colon polyps Mother   . Diverticulitis Mother   . Heart disease Father   . Cancer Father        unsure origin; had spot on lung  . Hypertension Father   . Hepatitis C Father   . Alcohol abuse Father   . Drug abuse Father   . Stroke Maternal Grandmother   . Osteoporosis Maternal Grandmother   . Alcohol abuse Maternal Grandfather   . Liver disease Maternal Grandfather     SOCIAL HISTORY: Social History   Socioeconomic History  . Marital status: Married    Spouse name: Not on file  . Number of children: 0  . Years  of education: Not on file  . Highest education level: Not on file  Occupational History  . Occupation: Adult nurse: [employer]  Tobacco Use  . Smoking status: Former Smoker    Packs/day: 1.50    Years: 15.00    Pack years: 22.50    Types: Cigarettes    Quit date: 05/15/2005    Years since quitting: 14.3  . Smokeless tobacco: Never Used  . Tobacco comment: quit in 2007, smoked for 13 years   Substance and Sexual Activity  . Alcohol use: No    Alcohol/week: 0.0 standard drinks    Comment: quit drinking on 2004---relapse in 2007---QUIT 07/2005  . Drug use: No    Comment: IV drug use///street drugs///inhalants  QUIT 07/2005  . Sexual activity: Yes    Partners: Male    Birth control/protection: Pill    Comment: First sexual intercourse at age 70,  more than 5 sexual partners   Other Topics Concern  . Not on file  Social History Narrative   Work or School: Glass blower/designer Situation: lives with husband      Spiritual Beliefs: none      Lifestyle: no regular exercise; diet is fair               Social Determinants of Corporate investment banker Strain:   . Difficulty of Paying Living Expenses:   Food Insecurity:   . Worried About Programme researcher, broadcasting/film/video in the Last Year:   . Barista in the Last Year:   Transportation Needs:   . Freight forwarder (Medical):   Marland Kitchen Lack of Transportation (Non-Medical):   Physical Activity:   . Days of Exercise per Week:   . Minutes of Exercise per Session:   Stress:   . Feeling of Stress :   Social Connections:   . Frequency of Communication with Friends and Family:   . Frequency of Social Gatherings with Friends and Family:   . Attends Religious Services:   . Active Member of Clubs or Organizations:   . Attends Banker Meetings:   Marland Kitchen Marital Status:   Intimate Partner Violence:   . Fear of Current or Ex-Partner:   . Emotionally Abused:   Marland Kitchen Physically Abused:   . Sexually  Abused:       PHYSICAL EXAM  Vitals:   09/23/19 1401  BP: 119/80  Pulse: 82  Temp: (!) 97.4 F (36.3 C)  Weight: 208 lb (94.3 kg)  Height: 5' (1.524 m)   Body mass index is 40.62 kg/m.  Generalized: Well developed, in no acute distress  Cardiology: normal rate and rhythm, no murmur noted Respiratory: clear to auscultation bilaterally  Neurological examination  Mentation: Alert oriented to time, place, history taking. Follows all commands speech and language fluent Cranial nerve II-XII: Pupils were equal round reactive to light. Extraocular movements were full, visual field were full  Motor: The motor testing reveals 5 over 5 strength of all 4 extremities. Good symmetric motor tone is noted throughout.  Gait and station: Gait is normal.   DIAGNOSTIC DATA (LABS, IMAGING, TESTING) - I reviewed patient records, labs, notes, testing and imaging myself where available.  No flowsheet data found.   Lab Results  Component Value Date   WBC 8.3 08/06/2019   HGB 15.0 08/06/2019   HCT 43.3 08/06/2019   MCV 91.0 08/06/2019   PLT 302.0 08/06/2019  Component Value Date/Time   NA 138 08/06/2019 0928   K 4.3 08/06/2019 0928   CL 104 08/06/2019 0928   CO2 25 08/06/2019 0928   GLUCOSE 82 08/06/2019 0928   BUN 11 08/06/2019 0928   CREATININE 0.84 08/06/2019 0928   CREATININE 0.96 11/01/2018 1507   CALCIUM 9.2 08/06/2019 0928   PROT 6.8 08/06/2019 0928   ALBUMIN 4.3 08/06/2019 0928   AST 28 08/06/2019 0928   ALT 39 (H) 08/06/2019 0928   ALKPHOS 48 08/06/2019 0928   BILITOT 0.5 08/06/2019 0928   GFRNONAA 79 (L) 06/21/2011 1846   GFRAA >90 06/21/2011 1846   Lab Results  Component Value Date   CHOL 183 08/06/2019   HDL 47.30 08/06/2019   LDLCALC 96 06/19/2017   LDLDIRECT 111.0 08/06/2019   TRIG 207.0 (H) 08/06/2019   CHOLHDL 4 08/06/2019   Lab Results  Component Value Date   HGBA1C 5.4 08/06/2019   No results found for: [id number] Lab Results  Component Value  Date   TSH 3.42 08/06/2019     ASSESSMENT AND PLAN 44 y.o. year old female  has a past medical history of Abnormal pap, Alcoholism (HCC), Allergy, Asthma, Blood in stool, Depression (emotion), Frequent headaches, Hepatitis C, chronic (HCC), Irritable bowel syndrome, OSA on CPAP - sees Dr. Jenne Campus in ENT (10/09/2013), and Seasonal allergies. here with     ICD-10-CM   1. Migraine with aura and without status migrainosus, not intractable  G43.109 Rimegepant Sulfate (NURTEC) 75 MG TBDP    topiramate (TOPAMAX) 25 MG tablet  2. OSA on CPAP  G47.33 Rimegepant Sulfate (NURTEC) 75 MG TBDP   Z99.89     Jacki Cones is doing very well with CPAP therapy.  Compliance report reveals excellent compliance.  She was encouraged to continue using CPAP nightly and for greater than 4 hours each night.  I will repeat download in approximately 4 weeks to ensure that AHI remains at 5 or below.  CPAP is currently set to auto titrate from 6 to 12 cm of water pressure.  Median pressure of 11.3 with 95th percentile of 12 cm of water.  We may consider increasing maximum pressure if AHI does not remain under 5.  She is doing very well at this time.  She does report continued headaches.  She would like to start migraine prevention medications.  We have discussed multiple options.  She is most interested in topiramate as she feels that this will also be helpful for weight loss.  She is currently using Mirena for birth control.  I have reviewed warnings associated with use of topiramate with hormonal contraceptives, however, we will start with a low dose that I do not feel will affect efficacy of birth control.  We have also discussed potential for worsening depression.  She feels that symptoms have been well managed for quite some time and she is currently monitor closely with psychiatry.  She was educated regarding side effects with topiramate and advised to monitor mood very closely.  She will reach out to me with any concerns.  We will  start a very low dose of 25 mg at bedtime.  May consider increasing to 50 mg if well tolerated.  She was advised to take this medication every day.  We will switch Ubrelvy to Nurtec.  I am hopeful that this may be effective in migraine abortive therapy.  Co-pay card given in the office.  She was encouraged to stay well-hydrated and focus on healthy lifestyle habits with well-balanced diet  and regular exercise.  She will return to see me in the office in 3 to 6 months, sooner if needed.  She verbalizes understanding and agreement with this plan.   No orders of the defined types were placed in this encounter.    Meds ordered this encounter  Medications  . Rimegepant Sulfate (NURTEC) 75 MG TBDP    Sig: Take 75 mg by mouth daily as needed (take for abortive therapy of migraine, no more than 1 tablet in 24 hours or 10 per month).    Dispense:  10 tablet    Refill:  11    Order Specific Question:   Supervising Provider    Answer:   Anson FretAHERN, ANTONIA B [id number]]  . topiramate (TOPAMAX) 25 MG tablet    Sig: Take 1 tablet (25 mg total) by mouth daily.    Dispense:  90 tablet    Refill:  3    Order Specific Question:   Supervising Provider    Answer:   Anson FretAHERN, ANTONIA B [id number]]      I spent 15 minutes with the patient. 50% of this time was spent counseling and educating patient on plan of care and medications.    Shawnie Dappermy Tayden Duran, FNP-C 09/23/2019, 4:14 PM [facility] [address], [address] [place], [place] [address] [phone]  I reviewed the above note and documentation by the Nurse Practitioner and agree with the history, exam, assessment and plan as outlined above. I was available for consultation. Huston FoleySaima Athar, MD, PhD [facility]

## 2019-09-24 MED ORDER — TOPIRAMATE 25 MG PO TABS: 25.0000 mg | ORAL_TABLET | Freq: Every day | ORAL | 3 refills | Status: DC

## 2019-09-24 NOTE — Telephone Encounter (Signed)
Spoke to pt and she will use goodrx at [facility] at [address] [place].  Sent new rx.

## 2019-09-29 ENCOUNTER — Ambulatory Visit: Payer: Self-pay | Admitting: Neurology

## 2019-10-01 ENCOUNTER — Telehealth: Payer: Self-pay | Admitting: *Deleted

## 2019-10-01 NOTE — Telephone Encounter (Addendum)
Nurtec PA, key: [id number]. Failed: maxalt, Bernita Raisin, Imitrex, Fioricet, PCP advised she not take Ibuprofen.  Unable to complete PA; "patient is inactive" mesaage received when I tried both 2021 insurance cards in her EMR.   Called [employer], spoke with Clydie Braun who stated she will fax over PA for Nurtec, advised to fax back with clinical notes by end of today. Received fax, questions answered, placed on NP"s desk for review, signature.  Nurtec PA signed, faxed to [facility], [phone].

## 2019-10-02 ENCOUNTER — Encounter: Payer: Self-pay | Admitting: *Deleted

## 2019-10-02 NOTE — Telephone Encounter (Signed)
Received fax from [employer]: Nurtec approved from 10/02/19 to 10/01/2020.Marland Kitchen Notified patient via my chart, faxed approval to pharmacy.

## 2019-10-08 ENCOUNTER — Ambulatory Visit: Payer: Managed Care, Other (non HMO)

## 2019-10-09 ENCOUNTER — Ambulatory Visit: Payer: Managed Care, Other (non HMO)

## 2019-10-16 ENCOUNTER — Telehealth (INDEPENDENT_AMBULATORY_CARE_PROVIDER_SITE_OTHER): Payer: 59 | Admitting: Family Medicine

## 2019-10-16 ENCOUNTER — Encounter: Payer: Self-pay | Admitting: Family Medicine

## 2019-10-16 VITALS — Temp 100.5°F

## 2019-10-16 DIAGNOSIS — R509 Fever, unspecified: Secondary | ICD-10-CM | POA: Diagnosis not present

## 2019-10-16 DIAGNOSIS — R05 Cough: Secondary | ICD-10-CM | POA: Diagnosis not present

## 2019-10-16 DIAGNOSIS — E669 Obesity, unspecified: Secondary | ICD-10-CM

## 2019-10-16 DIAGNOSIS — J45991 Cough variant asthma: Secondary | ICD-10-CM

## 2019-10-16 DIAGNOSIS — R197 Diarrhea, unspecified: Secondary | ICD-10-CM

## 2019-10-16 DIAGNOSIS — R059 Cough, unspecified: Secondary | ICD-10-CM

## 2019-10-16 NOTE — Progress Notes (Signed)
Virtual Visit via Video Note  I connected with Nicole [name]  on 10/16/19 at 11:00 AM EDT by a video enabled telemedicine application and verified that I am speaking with the correct person using two identifiers.  Location patient: home, [place] Location provider:work or home office Persons participating in the virtual visit: patient, provider, husband  I discussed the limitations of evaluation and management by telemedicine and the availability of in person appointments. The patient expressed understanding and agreed to proceed.   HPI:  Acute visit for ? COVID19: -this started yesterday -her boss went to a party last weekend and then felt sick with fever, malaise, diarrhea she was exposed to her boss the last 3 days -symptoms include fever with high of 100.7, body aches, chills, cough, diarrhea (watery - 3x today, no blood), HA, mild SOB when has a coughing fit- but this tends to be normal for her when sick as she has asthma - she has used her alb once -temp 99.4 now after tylenol -denies any known COVID19 exposures -everyone in the office is fully vaccinated including her boss for COVID19, she had pfizer vaccine and is > 2 weeks out from the 2nd dose -has mirena IUD   ROS: See pertinent positives and negatives per HPI.  Past Medical History:  Diagnosis Date   Abnormal pap    w LEEP   Alcoholism (HCC)    Allergy    Asthma    Blood in stool    Depression (emotion)    Frequent headaches    Hepatitis C, chronic (HCC)    hep c tx successful   Irritable bowel syndrome    OSA on CPAP - sees Dr. Jenne Campus in ENT 10/09/2013   Seasonal allergies    scratch test in 2006///mold issue in house    Past Surgical History:  Procedure Laterality Date   DILATION AND CURETTAGE OF UTERUS  1996, 2001, 2002   x 3   LEEP  2008   NASAL FRACTURE SURGERY  1999   repair   NASAL SEPTOPLASTY W/ TURBINOPLASTY      Family History  Problem Relation Age of Onset   Emphysema Mother    Colon  polyps Mother    Diverticulitis Mother    Heart disease Father    Cancer Father        unsure origin; had spot on lung   Hypertension Father    Hepatitis C Father    Alcohol abuse Father    Drug abuse Father    Stroke Maternal Grandmother    Osteoporosis Maternal Grandmother    Alcohol abuse Maternal Grandfather    Liver disease Maternal Grandfather     SOCIAL HX: see hpi   Current Outpatient Medications:    albuterol (VENTOLIN HFA) 108 (90 Base) MCG/ACT inhaler, Inhale 2 puffs into the lungs every 6 (six) hours as needed for wheezing or shortness of breath., Disp: 8 g, Rfl: 0   ARIPiprazole (ABILIFY) 15 MG tablet, Take 15 mg by mouth daily.  , Disp: , Rfl:    cetirizine (ZYRTEC) 10 MG tablet, Take 10 mg by mouth daily., Disp: , Rfl:    desvenlafaxine (PRISTIQ) 100 MG 24 hr tablet, Take 100 mg by mouth daily., Disp: , Rfl:    dicyclomine (BENTYL) 10 MG capsule, Take 1 capsule (10 mg total) by mouth every 8 (eight) hours as needed. (Patient taking differently: Take 10 mg by mouth every 8 (eight) hours as needed for spasms. ), Disp: 90 capsule, Rfl: 1   diphenoxylate-atropine (  LOMOTIL) 2.5-0.025 MG tablet, Take 1 tablet by mouth 3 (three) times daily as needed for constipation., Disp: , Rfl:    doxepin (SINEQUAN) 10 MG capsule, Take 10 mg by mouth at bedtime. , Disp: , Rfl:    levonorgestrel (MIRENA) 20 MCG/24HR IUD, 1 each by Intrauterine route once., Disp: , Rfl:    Multiple Vitamin (MULTIVITAMINS PO), Take 1 tablet by mouth daily. , Disp: , Rfl:    ondansetron (ZOFRAN ODT) 8 MG disintegrating tablet, Take 1 tablet (8 mg total) by mouth every 8 (eight) hours as needed for nausea or vomiting., Disp: 30 tablet, Rfl: 3   Rimegepant Sulfate (NURTEC) 75 MG TBDP, Take 75 mg by mouth daily as needed (take for abortive therapy of migraine, no more than 1 tablet in 24 hours or 10 per month)., Disp: 10 tablet, Rfl: 11   topiramate (TOPAMAX) 25 MG tablet, Take 1 tablet (25  mg total) by mouth daily., Disp: 90 tablet, Rfl: 3  EXAM:  VITALS per patient if applicable:  GENERAL: alert, oriented, appears well and in no acute distress  HEENT: atraumatic, conjunttiva clear, no obvious abnormalities on inspection of external nose and ears  NECK: normal movements of the head and neck  LUNGS: on inspection no signs of respiratory distress, breathing rate appears normal, no obvious gross SOB, gasping or wheezing  CV: no obvious cyanosis  MS: moves all visible extremities without noticeable abnormality  PSYCH/NEURO: pleasant and cooperative, no obvious depression or anxiety, speech and thought processing grossly intact  ASSESSMENT AND PLAN:  Discussed the following assessment and plan:  Diarrhea, unspecified type  Fever, unspecified fever cause  Cough  Cough variant asthma  Obesity without serious comorbidity, unspecified classification, unspecified obesity type  -we discussed possible serious and likely etiologies, options for evaluation and workup, limitations of telemedicine visit vs in person visit, treatment, treatment risks and precautions. Pt prefers to treat via telemedicine empirically rather then risking or undertaking an in person visit at this moment. Discussed potential etiologies with viral illness likely, vs a combination of conditions, influenza or COVID19 (though less likely given vaccination status.). She opted for oral hydration, immodium prn, bland diet, no dairy, alb prn and close monitoring. Reports her O2 is 98% and she does not feel her cough or breathing require further treatment. While COVID19 is unlikely given vaccination status, she opted for testing given symptoms and high risk condition. Discussed limitations and risks of testing. Also provided # for COVID19 infusion clinic if positive over the weekend. Advised home isolation while sick and until 24 hours after fever resolves and 48-72 hours after diarrhea resolves if COVID19 test  neg. Advised home isolation per CDC if covid testing positive. Patient agrees to seek prompt in person care if worsening, new symptoms arise, or if is not improving with treatment.   I discussed the assessment and treatment plan with the patient. The patient was provided an opportunity to ask questions and all were answered. The patient agreed with the plan and demonstrated an understanding of the instructions.   The patient was advised to call back or seek an in-person evaluation if the symptoms worsen or if the condition fails to improve as anticipated.   Lucretia Kern, DO

## 2019-10-23 ENCOUNTER — Telehealth: Payer: Self-pay | Admitting: Family Medicine

## 2019-10-23 NOTE — Telephone Encounter (Signed)
Compliance report dated 09/17/2019 through 10/22/2019 reveals that she used CPAP every day.  She is CPAP greater than 4 hours every day.  Residual AHI was 4.7 on 6 to 12 cm of water.  Pressure in the 95th percentile at 12 cm of water.  We will continue to monitor.

## 2019-11-04 ENCOUNTER — Ambulatory Visit
Admission: RE | Admit: 2019-11-04 | Discharge: 2019-11-04 | Disposition: A | Discharge status: Home / Self Care | Payer: 59 | Source: Ambulatory Visit | Attending: Family Medicine | Admitting: Family Medicine

## 2019-11-04 ENCOUNTER — Other Ambulatory Visit: Payer: Self-pay

## 2019-11-04 DIAGNOSIS — Z0000 Encounter for general adult medical examination without abnormal findings: Secondary | ICD-10-CM

## 2019-12-15 ENCOUNTER — Ambulatory Visit: Payer: 59 | Admitting: Family Medicine

## 2019-12-15 ENCOUNTER — Encounter: Payer: Self-pay | Admitting: Family Medicine

## 2019-12-15 ENCOUNTER — Other Ambulatory Visit: Payer: Self-pay

## 2019-12-15 VITALS — BP 124/72 | HR 104 | Temp 97.6°F | Wt 214.3 lb

## 2019-12-15 DIAGNOSIS — R21 Rash and other nonspecific skin eruption: Secondary | ICD-10-CM

## 2019-12-15 MED ORDER — TRIAMCINOLONE ACETONIDE 0.1 % EX CREA: 1.0000 "application " | TOPICAL_CREAM | Freq: Two times a day (BID) | CUTANEOUS | 0 refills | Status: DC | PRN

## 2019-12-15 NOTE — Patient Instructions (Signed)
Consider over the counter hydrocortisone cream 1% and use twice daily.     If that does not work, can try the prescription cream.

## 2019-12-15 NOTE — Progress Notes (Signed)
New Patient Office Visit  Subjective:  Patient ID: Nicole [name], female    DOB: 07/13/1975  Age: 44 y.o. MRN: [id number]  CC:  Chief Complaint  Patient presents with  . Rash    on right eyelid started ozzing on friday it now healing now has spot on left neck. itchy, scaly, and red     HPI Nicole [name] presents for skin rash involving right upper lid.  This been present now for about a month and over the weekend she had some "oozing".  Rash actually looks better today.  Denies any change in make- up.  She states she does have a history of seborrheic dermatitis.  She has not tried anything topically for the rash.  She also has small area of rash left upper chest wall.  This is nonscaly and nonpruritic no other skin rashes noted.  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  Past Medical History:  Diagnosis Date  . Abnormal pap    w LEEP  . Alcoholism (HCC)   . Allergy   . Asthma   . Blood in stool   . Depression (emotion)   . Frequent headaches   . Hepatitis C, chronic (HCC)    hep c tx successful  . Irritable bowel syndrome   . OSA on CPAP - sees Dr. Jenne Campus in ENT 10/09/2013  . Seasonal allergies    scratch test in 2006///mold issue in house    Past Surgical History:  Procedure Laterality Date  . DILATION AND CURETTAGE OF UTERUS  1996, 2001, 2002   x 3  . LEEP  2008  . NASAL FRACTURE SURGERY  1999   repair  . NASAL SEPTOPLASTY W/ TURBINOPLASTY      Family History  Problem Relation Age of Onset  . Emphysema Mother   . Colon polyps Mother   . Diverticulitis Mother   . Heart disease Father   . Cancer Father        unsure origin; had spot on lung  . Hypertension Father   . Hepatitis C Father   . Alcohol abuse Father   . Drug abuse Father   . Stroke Maternal Grandmother   . Osteoporosis Maternal Grandmother   . Alcohol abuse Maternal Grandfather   . Liver disease Maternal Grandfather     Social History   Socioeconomic  History  . Marital status: Married    Spouse name: Not on file  . Number of children: 0  . Years of education: Not on file  . Highest education level: Not on file  Occupational History  . Occupation: Adult nurse: [employer]  Tobacco Use  . Smoking status: Former Smoker    Packs/day: 1.50    Years: 15.00    Pack years: 22.50    Types: Cigarettes    Quit date: 05/15/2005    Years since quitting: 14.5  . Smokeless tobacco: Never Used  . Tobacco comment: quit in 2007, smoked for 13 years   Vaping Use  . Vaping Use: Never used  Substance and Sexual Activity  . Alcohol use: No    Alcohol/week: 0.0 standard drinks    Comment: quit drinking on 2004---relapse in 2007---QUIT 07/2005  . Drug use: No    Comment: IV drug use///street drugs///inhalants  QUIT 07/2005  . Sexual activity: Yes    Partners: Male    Birth control/protection: Pill    Comment: First sexual intercourse at age  16,  more than 5 sexual partners   Other Topics Concern  . Not on file  Social History Narrative   Work or School: Glass blower/designer Situation: lives with husband      Spiritual Beliefs: none      Lifestyle: no regular exercise; diet is fair               Social Determinants of Corporate investment banker Strain:   . Difficulty of Paying Living Expenses:   Food Insecurity:   . Worried About Programme researcher, broadcasting/film/video in the Last Year:   . Barista in the Last Year:   Transportation Needs:   . Freight forwarder (Medical):   Marland Kitchen Lack of Transportation (Non-Medical):   Physical Activity:   . Days of Exercise per Week:   . Minutes of Exercise per Session:   Stress:   . Feeling of Stress :   Social Connections:   . Frequency of Communication with Friends and Family:   . Frequency of Social Gatherings with Friends and Family:   . Attends Religious Services:   . Active Member of Clubs or Organizations:   . Attends Banker Meetings:   Marland Kitchen  Marital Status:   Intimate Partner Violence:   . Fear of Current or Ex-Partner:   . Emotionally Abused:   Marland Kitchen Physically Abused:   . Sexually Abused:     ROS Review of Systems  Constitutional: Negative for chills and fever.  Skin: Positive for rash.    Objective:   Today's Vitals: BP 124/72 (BP Location: Left Arm, Patient Position: Sitting, Cuff Size: Normal)   Pulse 104   Temp 97.6 F (36.4 C) (Oral)   Wt (!) 214 lb 4.8 oz (97.2 kg)   SpO2 96%   BMI 41.85 kg/m   Physical Exam Vitals reviewed.  Constitutional:      Appearance: Normal appearance.  Cardiovascular:     Rate and Rhythm: Normal rate and regular rhythm.  Skin:    Comments: She has very minimal rash right upper lid which is slightly erythematous and minimally scaly.  She has rash left upper chest wall which is erythematous and nonscaly.  This has a little bit of the erythematous border  Neurological:     Mental Status: She is alert.     Assessment & Plan:   Eczema type rash right upper eyelid which appears to be clearing already.  -We recommended over-the-counter hydrocortisone 1% cream and if not clearing with that could use some triamcinolone 0.1% short-term.  She has another rash on her left upper chest wall which may be eczematous but not clear.  Doubt recurrent granuloma annulare.   she will try the triamcinolone and if this is not responding to hydrocortisone cream  Outpatient Encounter Medications as of 12/15/2019  Medication Sig  . albuterol (VENTOLIN HFA) 108 (90 Base) MCG/ACT inhaler Inhale 2 puffs into the lungs every 6 (six) hours as needed for wheezing or shortness of breath.  . ARIPiprazole (ABILIFY) 15 MG tablet Take 15 mg by mouth daily.    . cetirizine (ZYRTEC) 10 MG tablet Take 10 mg by mouth daily.  Marland Kitchen desvenlafaxine (PRISTIQ) 100 MG 24 hr tablet Take 100 mg by mouth daily.  . diphenoxylate-atropine (LOMOTIL) 2.5-0.025 MG tablet Take 1 tablet by mouth 3 (three) times daily as needed for  constipation.  Marland Kitchen doxepin (SINEQUAN) 10 MG capsule Take 10 mg by mouth at bedtime.   Marland Kitchen  levonorgestrel (MIRENA) 20 MCG/24HR IUD 1 each by Intrauterine route once.  . Multiple Vitamin (MULTIVITAMINS PO) Take 1 tablet by mouth daily.   . ondansetron (ZOFRAN ODT) 8 MG disintegrating tablet Take 1 tablet (8 mg total) by mouth every 8 (eight) hours as needed for nausea or vomiting.  . Rimegepant Sulfate (NURTEC) 75 MG TBDP Take 75 mg by mouth daily as needed (take for abortive therapy of migraine, no more than 1 tablet in 24 hours or 10 per month).  . topiramate (TOPAMAX) 25 MG tablet Take 1 tablet (25 mg total) by mouth daily.  Marland Kitchen dicyclomine (BENTYL) 10 MG capsule Take 1 capsule (10 mg total) by mouth every 8 (eight) hours as needed. (Patient not taking: Reported on 12/15/2019)  . triamcinolone cream (KENALOG) 0.1 % Apply 1 application topically 2 (two) times daily as needed.   No facility-administered encounter medications on file as of 12/15/2019.    Follow-up: No follow-ups on file.   Evelena Peat, MD

## 2019-12-29 ENCOUNTER — Ambulatory Visit: Payer: 59 | Admitting: Family Medicine

## 2020-01-27 ENCOUNTER — Other Ambulatory Visit: Payer: 59

## 2020-02-06 ENCOUNTER — Ambulatory Visit: Payer: Managed Care, Other (non HMO) | Admitting: Family Medicine

## 2020-03-18 ENCOUNTER — Ambulatory Visit: Payer: 59 | Admitting: Family Medicine

## 2020-04-12 ENCOUNTER — Emergency Department (HOSPITAL_COMMUNITY): Payer: 59

## 2020-04-12 ENCOUNTER — Other Ambulatory Visit: Payer: Self-pay

## 2020-04-12 ENCOUNTER — Encounter (HOSPITAL_COMMUNITY): Payer: Self-pay

## 2020-04-12 ENCOUNTER — Emergency Department (HOSPITAL_COMMUNITY)
Admission: EM | Admit: 2020-04-12 | Discharge: 2020-04-13 | Disposition: A | Discharge status: Home / Self Care | Payer: 59 | Attending: Emergency Medicine | Admitting: Emergency Medicine

## 2020-04-12 DIAGNOSIS — R002 Palpitations: Secondary | ICD-10-CM | POA: Insufficient documentation

## 2020-04-12 DIAGNOSIS — R42 Dizziness and giddiness: Secondary | ICD-10-CM | POA: Insufficient documentation

## 2020-04-12 DIAGNOSIS — J4521 Mild intermittent asthma with (acute) exacerbation: Secondary | ICD-10-CM | POA: Diagnosis not present

## 2020-04-12 DIAGNOSIS — Z87891 Personal history of nicotine dependence: Secondary | ICD-10-CM | POA: Insufficient documentation

## 2020-04-12 DIAGNOSIS — R519 Headache, unspecified: Secondary | ICD-10-CM | POA: Insufficient documentation

## 2020-04-12 DIAGNOSIS — R079 Chest pain, unspecified: Secondary | ICD-10-CM | POA: Diagnosis present

## 2020-04-12 LAB — HEPATIC FUNCTION PANEL
ALT: 28 U/L (ref 0–44)
AST: 22 U/L (ref 15–41)
Albumin: 4.3 g/dL (ref 3.5–5.0)
Alkaline Phosphatase: 52 U/L (ref 38–126)
Bilirubin, Direct: <0.1 mg/dL (ref 0.0–0.2)
Total Bilirubin: 0.3 mg/dL (ref 0.3–1.2)
Total Protein: 7.4 g/dL (ref 6.5–8.1)

## 2020-04-12 LAB — BASIC METABOLIC PANEL
Anion gap: 10 (ref 5–15)
BUN: 12 mg/dL (ref 6–20)
CO2: 24 mmol/L (ref 22–32)
Calcium: 9.6 mg/dL (ref 8.9–10.3)
Chloride: 105 mmol/L (ref 98–111)
Creatinine, Ser: 0.85 mg/dL (ref 0.44–1.00)
GFR, Estimated: >60 mL/min (ref >60)
Glucose, Bld: 127 mg/dL — ABNORMAL HIGH (ref 70–99)
Potassium: 3.6 mmol/L (ref 3.5–5.1)
Sodium: 139 mmol/L (ref 135–145)

## 2020-04-12 LAB — CBC
HCT: 46 % (ref 36.0–46.0)
Hemoglobin: 15.9 g/dL — ABNORMAL HIGH (ref 12.0–15.0)
MCH: 31.4 pg (ref 26.0–34.0)
MCHC: 34.6 g/dL (ref 30.0–36.0)
MCV: 90.9 fL (ref 80.0–100.0)
Platelets: 346 10*3/uL (ref 150–400)
RBC: 5.06 MIL/uL (ref 3.87–5.11)
RDW: 12.5 % (ref 11.5–15.5)
WBC: 13.2 10*3/uL — ABNORMAL HIGH (ref 4.0–10.5)
nRBC: 0 % (ref 0.0–0.2)

## 2020-04-12 LAB — TROPONIN I (HIGH SENSITIVITY)
Troponin I (High Sensitivity): <2 ng/L (ref <18)
Troponin I (High Sensitivity): <2 ng/L (ref <18)

## 2020-04-12 LAB — I-STAT BETA HCG BLOOD, ED (NOT ORDERABLE): I-stat hCG, quantitative: <5 m[IU]/mL (ref <5)

## 2020-04-12 LAB — LIPASE, BLOOD: Lipase: 30 U/L (ref 11–51)

## 2020-04-12 MED ORDER — IPRATROPIUM-ALBUTEROL 0.5-2.5 (3) MG/3ML IN SOLN
3.0000 mL | Freq: Once | RESPIRATORY_TRACT | Status: AC
  Administered 2020-04-12: 3 mL via RESPIRATORY_TRACT
  Filled 2020-04-12: qty 3

## 2020-04-12 NOTE — ED Triage Notes (Signed)
Patient arrived stating shortly after eating dinner about an hour ago she felt like her chest was night and short of breath. Reports feeling like her heart was racing. Declines any NV or pain. States she does feel lightheaded.

## 2020-04-12 NOTE — ED Provider Notes (Signed)
[name]-EMERGENCY DEPT Provider Note   CSN: [id number] Arrival date & time: 04/12/20  2005     History Chief Complaint  Patient presents with  . Chest Pain    Nicole [name] is a 44 y.o. 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  The patient reports that she developed a migraine earlier tonight that felt consistent with her usual headaches and took her home Nurtec.  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  She became very upset and was crying so she took 2 puffs of her albuterol inhaler.  After using her home inhaler, she felt as if her heart was beating out of her chest and she began endorsing palpitations followed by feeling dizzy, lightheaded, and more short of breath.  She notes that initially the palpitations felt very regular, but after some time she began to feel as if her heartbeat was irregular. She reports that all of her symptoms have subsided except for the shortness of breath and she is having a mild posterior headache.  She did initially notice some pain below her left breast when the EKG stickers were being applied in the ER, but otherwise that she has had no chest pain.  No leg swelling, back pain, neck pain, visual changes, numbness, weakness, abdominal pain, nausea, vomiting, diaphoresis, diarrhea, dysuria, hematuria, or vaginal bleeding.  She does report that she had a history of a similar constellation of symptoms about a month ago as well as previous times in the past.  States that she noticed the symptoms at that time when she was lying down, but cannot recall any other details regarding the episode.  She does have a significant family  history of cardiovascular disease.  Reports that her father had 2 MIs and has 40s.  She has never been evaluated by cardiologist.  She has an IUD.  No personal or familial history of VTE.  No recent surgery or immobilization.  No recent long travel.  She also reports that she has a history of asthma that is triggered by changes in the weather, particularly cold weather.  She denies rash, wheezing or cough at this time.  She does have a history of depression, but denies a history of panic attacks.  The history is provided by the patient and medical records. No language interpreter was used.       Past Medical History:  Diagnosis Date  . Abnormal pap    w LEEP  . Alcoholism (HCC)   . Allergy   . Asthma   . Blood in stool   . Depression (emotion)   . Frequent headaches   . Hepatitis C, chronic (HCC)    hep c tx successful  . Irritable bowel syndrome   . OSA on CPAP - sees Dr. Jenne Campus in ENT 10/09/2013  . Seasonal allergies    scratch test in 2006///mold issue in house    Patient Active Problem List   Diagnosis Date Noted  . IBS (irritable bowel syndrome) 05/24/2016  . Chronic venous insufficiency 05/24/2016  . Migraine 05/24/2016  . Pulmonary nodules 10/27/2014  . Hepatitis C, chronic (HCC)   . Depression 10/09/2013  . OSA on  CPAP - sees Dr. Jenne Campus in ENT 10/09/2013  . Allergic rhinitis 05/30/2012  . Cough variant asthma 02/08/2012    Past Surgical History:  Procedure Laterality Date  . DILATION AND CURETTAGE OF UTERUS  1996, 2001, 2002   x 3  . LEEP  2008  . NASAL FRACTURE SURGERY  1999   repair  . NASAL SEPTOPLASTY W/ TURBINOPLASTY       OB History    Gravida  4   Para      Term      Preterm      AB  4   Living  0     SAB  1   TAB  3   Ectopic      Multiple      Live Births              Family History  Problem Relation Age of Onset  . Emphysema Mother   . Colon polyps Mother   . Diverticulitis Mother   . Heart disease Father   .  Cancer Father        unsure origin; had spot on lung  . Hypertension Father   . Hepatitis C Father   . Alcohol abuse Father   . Drug abuse Father   . Stroke Maternal Grandmother   . Osteoporosis Maternal Grandmother   . Alcohol abuse Maternal Grandfather   . Liver disease Maternal Grandfather     Social History   Tobacco Use  . Smoking status: Former Smoker    Packs/day: 1.50    Years: 15.00    Pack years: 22.50    Types: Cigarettes    Quit date: 05/15/2005    Years since quitting: 14.9  . Smokeless tobacco: Never Used  . Tobacco comment: quit in 2007, smoked for 13 years   Vaping Use  . Vaping Use: Never used  Substance Use Topics  . Alcohol use: No    Alcohol/week: 0.0 standard drinks    Comment: quit drinking on 2004---relapse in 2007---QUIT 07/2005  . Drug use: No    Comment: IV drug use///street drugs///inhalants  QUIT 07/2005    Home Medications Prior to Admission medications   Medication Sig Start Date End Date Taking? Authorizing Provider  albuterol (VENTOLIN HFA) 108 (90 Base) MCG/ACT inhaler Inhale 2 puffs into the lungs every 6 (six) hours as needed for wheezing or shortness of breath. 01/20/19   Jannifer Rodney A, FNP  ARIPiprazole (ABILIFY) 15 MG tablet Take 15 mg by mouth daily.      [provider]  cetirizine (ZYRTEC) 10 MG tablet Take 10 mg by mouth daily.    [provider]  desvenlafaxine (PRISTIQ) 100 MG 24 hr tablet Take 100 mg by mouth daily.    [provider]  dicyclomine (BENTYL) 10 MG capsule Take 1 capsule (10 mg total) by mouth every 8 (eight) hours as needed. Patient not taking: Reported on 12/15/2019 05/28/17   Benancio Deeds, MD  diphenoxylate-atropine (LOMOTIL) 2.5-0.025 MG tablet Take 1 tablet by mouth 3 (three) times daily as needed for constipation. 06/20/18   [provider]  doxepin (SINEQUAN) 10 MG capsule Take 10 mg by mouth at bedtime.     [provider]  levonorgestrel (MIRENA) 20 MCG/24HR  IUD 1 each by Intrauterine route once.    [provider]  Multiple Vitamin (MULTIVITAMINS PO) Take 1 tablet by mouth daily.     [provider]  ondansetron (ZOFRAN ODT) 8 MG disintegrating tablet Take  1 tablet (8 mg total) by mouth every 8 (eight) hours as needed for nausea or vomiting. 05/28/17   Armbruster, Willaim Rayas, MD  Rimegepant Sulfate (NURTEC) 75 MG TBDP Take 75 mg by mouth daily as needed (take for abortive therapy of migraine, no more than 1 tablet in 24 hours or 10 per month). 09/23/19   Lomax, Amy, NP  topiramate (TOPAMAX) 25 MG tablet Take 1 tablet (25 mg total) by mouth daily. 09/24/19   Lomax, Amy, NP  triamcinolone cream (KENALOG) 0.1 % Apply 1 application topically 2 (two) times daily as needed. 12/15/19   Burchette, Elberta Fortis, MD    Allergies    Cashew nut oil, Other, Triptans, Lexapro [escitalopram oxalate], Cefaclor, and Nickel  Review of Systems   Review of Systems  Constitutional: Negative for activity change, chills and fever.  HENT: Negative for congestion and sore throat.   Eyes: Negative for visual disturbance.  Respiratory: Positive for chest tightness and shortness of breath. Negative for cough, choking and wheezing.   Cardiovascular: Positive for chest pain and palpitations. Negative for leg swelling.  Gastrointestinal: Negative for abdominal pain, constipation, diarrhea, nausea and vomiting.  Genitourinary: Negative for dysuria.  Musculoskeletal: Negative for back pain, myalgias, neck pain and neck stiffness.  Skin: Negative for rash.  Allergic/Immunologic: Negative for immunocompromised state.  Neurological: Positive for dizziness, light-headedness and headaches.  Psychiatric/Behavioral: Negative for confusion.    Physical Exam Updated Vital Signs BP 121/80   Pulse 81   Temp (!) 97.5 F (36.4 C) (Oral)   Resp (!) 23   Ht 5' (1.524 m)   Wt 95.3 kg   SpO2 96%   BMI 41.01 kg/m   Physical Exam Vitals and nursing note reviewed.    Constitutional:      General: She is not in acute distress.    Appearance: She is not ill-appearing, toxic-appearing or diaphoretic.  HENT:     Head: Normocephalic.     Nose: Nose normal.     Mouth/Throat:     Mouth: Mucous membranes are moist.     Pharynx: No oropharyngeal exudate or posterior oropharyngeal erythema.  Eyes:     Conjunctiva/sclera: Conjunctivae normal.  Cardiovascular:     Rate and Rhythm: Normal rate and regular rhythm.     Pulses: Normal pulses.     Heart sounds: Normal heart sounds. No murmur heard.  No friction rub. No gallop.   Pulmonary:     Effort: No respiratory distress.     Breath sounds: No stridor. No wheezing, rhonchi or rales.     Comments: Mild tachypnea.  Lung sounds are diminished throughout.  No rhonchi, rales, or wheezes.  She has conversational dyspnea. Chest:     Chest wall: No tenderness.  Abdominal:     General: There is no distension.     Palpations: Abdomen is soft. There is no mass.     Tenderness: There is no abdominal tenderness. There is no right CVA tenderness, left CVA tenderness, guarding or rebound.     Hernia: No hernia is present.     Comments: Abdomen is soft, nontender, nondistended.  Musculoskeletal:     Cervical back: Neck supple.     Right lower leg: No edema.     Left lower leg: No edema.  Skin:    General: Skin is warm.     Capillary Refill: Capillary refill takes less than 2 seconds.     Coloration: Skin is not jaundiced or pale.     Findings: No  rash.  Neurological:     Mental Status: She is alert.  Psychiatric:        Behavior: Behavior normal.     ED Results / Procedures / Treatments   Labs (all labs ordered are listed, but only abnormal results are displayed) Labs Reviewed  BASIC METABOLIC PANEL - Abnormal; Notable for the following components:      Result Value   Glucose, Bld 127 (*)    All other components within normal limits  CBC - Abnormal; Notable for the following components:   WBC 13.2 (*)     Hemoglobin 15.9 (*)    All other components within normal limits  LIPASE, BLOOD  HEPATIC FUNCTION PANEL  I-STAT BETA HCG BLOOD, ED ([facility], [facility], [facility] ONLY)  I-STAT BETA HCG BLOOD, ED (NOT ORDERABLE)  TROPONIN I (HIGH SENSITIVITY)  TROPONIN I (HIGH SENSITIVITY)    EKG EKG Interpretation  Date/Time:  Monday April 12 2020 20:27:02 EST Ventricular Rate:  85 PR Interval:    QRS Duration: 98 QT Interval:  365 QTC Calculation: 434 R Axis:   76 Text Interpretation: Sinus rhythm Borderline short PR interval Low voltage, precordial leads When compared with ECG of 04/09/2019, No significant change was found Confirmed by Dione BoozeGlick, David ([id number]) on 04/13/2020 1:03:11 AM   Radiology DG Chest 2 View  Result Date: 04/12/2020 CLINICAL DATA:  Chest pressure, tightness and shortness of breath EXAM: CHEST - 2 VIEW COMPARISON:  None. FINDINGS: The heart size and mediastinal contours are within normal limits. Mild perihilar bronchial wall thickening. No focal consolidation or overt pulmonary edema. The visualized skeletal structures are unremarkable. IMPRESSION: Mild perihilar bronchial wall thickening as can be seen with reactive airway disease/bronchitis. No focal airspace opacity. Electronically Signed   By: Maudry MayhewJeffrey  Waltz MD   On: 04/12/2020 21:00    Procedures Procedures (including critical care time)  Medications Ordered in ED Medications  ipratropium-albuterol (DUONEB) 0.5-2.5 (3) MG/3ML nebulizer solution 3 mL (3 mLs Nebulization Given 04/12/20 2344)    ED Course  I have reviewed the triage vital signs and the nursing notes.  Pertinent labs & imaging results that were available during my care of the patient were reviewed by me and considered in my medical decision making (see chart for details).    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  She notably mentions that the palpitations, rapid heartbeat, dizziness, lightheadedness did not begin until after she used 2 puffs of her albuterol inhaler for shortness of breath.  Vital signs are reassuring.  No hypoxia noted.  EKG with no changes from previous.  Imaging has been reviewed and independently interpreted by me.  Chest x-ray with mild perihilar bronchial thickening suspicious for reactive airway disease or bronchitis.  New opacity or consolidations.  Labs have been reviewed and dependently interpreted by me.  Delta troponin is not elevated. HEAR score is 1.  Lipase is not elevated.  Hepatic function panel is unremarkable.  She does have a mild leukocytosis of 13.2, but is having no infectious  symptoms.  Labs are otherwise reassuring.  She is PERC negative, and I have a low suspicion for PE.  Doubt aortic dissection, ACS.  On exam, lung sounds were diminished and given concern for reactive airway disease on chest x-ray, DuoNeb was ordered.  Headache has resolved on reevaluation, she reported marked improvement in shortness of breath.  She also reports that the albuterol did reproduce the palpitations that she was feeling earlier.  Discussed side effects of albuterol.  She was ambulated in the emergency department on pulse ox to maintain oxygen saturation of 96%.  She states that she is no longer feeling short of breath.  Suspect reactive airway disease exacerbation in the setting of worsening anxiety and stress.  She has good follow-up with primary care, and I encouraged her to follow closely.  No indication for cardiology referral at this time.  She is hemodynamically stable no acute distress.  ER return precautions given.  Safe for  discharge home with outpatient follow-up as indicated.   Final Clinical Impression(s) / ED Diagnoses Final diagnoses:  Mild intermittent reactive airway disease with acute exacerbation    Rx / DC Orders ED Discharge Orders    None       Barkley Boards, PA-C 04/13/20 0125    Dione Booze, MD 04/13/20 [phone]

## 2020-04-13 NOTE — Discharge Instructions (Signed)
Thank you for allowing me to care for you today in the Emergency Department.   As we discussed, your work-up was reassuring today.  You can use 2 to 4 puffs of the albuterol inhaler every 4-6 hours as needed for wheezing, cough, shortness of breath.  Side effect of this medication is feeling as if your heart is racing or feeling jittery.  Please follow-up with primary care for recheck of your symptoms within the next week.  Return the emergency department if you develop respiratory distress, chest pain, sweating, if you pass out, if your fingers or lips turn blue, or if you develop other new, concerning symptoms.

## 2020-04-13 NOTE — ED Notes (Signed)
Pt SPO2 95-96% while ambulating.  Respiration even and unlabored.  NADN.  Will continue to monitor.

## 2020-04-23 ENCOUNTER — Ambulatory Visit: Payer: 59 | Admitting: Family Medicine

## 2020-04-23 ENCOUNTER — Encounter: Payer: Self-pay | Admitting: Family Medicine

## 2020-04-23 ENCOUNTER — Other Ambulatory Visit: Payer: Self-pay

## 2020-04-23 VITALS — BP 98/72 | HR 85 | Temp 97.9°F | Ht 60.0 in | Wt 210.3 lb

## 2020-04-23 DIAGNOSIS — J4540 Moderate persistent asthma, uncomplicated: Secondary | ICD-10-CM | POA: Diagnosis not present

## 2020-04-23 DIAGNOSIS — J302 Other seasonal allergic rhinitis: Secondary | ICD-10-CM

## 2020-04-23 DIAGNOSIS — R000 Tachycardia, unspecified: Secondary | ICD-10-CM | POA: Diagnosis not present

## 2020-04-23 MED ORDER — FLUTICASONE PROPIONATE 50 MCG/ACT NA SUSP: 2.0000 | Freq: Every day | NASAL | 6 refills | Status: DC

## 2020-04-23 MED ORDER — LEVALBUTEROL TARTRATE 45 MCG/ACT IN AERO: 2.0000 | INHALATION_SPRAY | RESPIRATORY_TRACT | 12 refills | Status: DC | PRN

## 2020-04-23 MED ORDER — MONTELUKAST SODIUM 10 MG PO TABS: 10.0000 mg | ORAL_TABLET | Freq: Every day | ORAL | 1 refills | Status: DC

## 2020-04-23 MED ORDER — SPACER/AERO-HOLDING CHAMBERS DEVI: 0 refills | Status: DC

## 2020-04-23 NOTE — Progress Notes (Signed)
[name] DOB: 25-Jan-1976 Encounter date: 04/23/2020  This is a 44 y.o. female who presents with Chief Complaint  Patient presents with  . Follow-up    History of present illness: Seen in emergency room on 11/29 for acute exacerbation of asthma.  Doigng better than she was last week. Had received negative feedback and was upset, crying and hyperventilating a little; then started with migraine/aura. Took nurtec and ate dinner and laid down. Heart started beating really quickly; felt it in throat; felt like skipping a beat; felt like she was having heart attack. Had eval at ER and everything looked pretty good except CXR suggestive for asthma. Ssingulair, advair, symbicort in past for control.   Uses inhaler more in colder months typically. Last year didn't need rescue as much as she has this year. This year quality of breathing feels different. Just feels harder to breathe. Typically using inhaler about 3-4 times/week. Last year was using maybe every few months. Did move last year, but feels this should have helped with breathing because there was black mold issue at former residence.   Hasn't been to hospital in past for breathing issues.   Some sinus congestion lately. Increase in sinus headaches. Increased post nasal drip. Noted increase in inhaler use with stairs, exercise. Spring really wasn't issue.   Has tolerated flonase in the past. symbicort made voice deep, advair caused thrush.   Allergies  Allergen Reactions  . Cashew Nut Oil Anaphylaxis    Cashews and [place] nuts  . Other Anaphylaxis    Nuts-[place] and cashews  . Triptans Anaphylaxis  . Lexapro [Escitalopram Oxalate]     manic  . Cefaclor Rash  . Nickel Rash   Current Meds  Medication Sig  . ARIPiprazole (ABILIFY) 15 MG tablet Take 15 mg by mouth daily.  . cetirizine (ZYRTEC) 10 MG tablet Take 10 mg by mouth daily.  Marland Kitchen desvenlafaxine (PRISTIQ) 100 MG 24 hr tablet Take 100 mg by mouth daily.  Marland Kitchen dicyclomine  (BENTYL) 10 MG capsule Take 1 capsule (10 mg total) by mouth every 8 (eight) hours as needed.  . diphenoxylate-atropine (LOMOTIL) 2.5-0.025 MG tablet Take 1 tablet by mouth 3 (three) times daily as needed for constipation.  Marland Kitchen doxepin (SINEQUAN) 10 MG capsule Take 10 mg by mouth at bedtime.   Marland Kitchen levonorgestrel (MIRENA) 20 MCG/24HR IUD 1 each by Intrauterine route once.  . Multiple Vitamin (MULTIVITAMINS PO) Take 1 tablet by mouth daily.   . ondansetron (ZOFRAN ODT) 8 MG disintegrating tablet Take 1 tablet (8 mg total) by mouth every 8 (eight) hours as needed for nausea or vomiting.  . Rimegepant Sulfate (NURTEC) 75 MG TBDP Take 75 mg by mouth daily as needed (take for abortive therapy of migraine, no more than 1 tablet in 24 hours or 10 per month).  . topiramate (TOPAMAX) 25 MG tablet Take 1 tablet (25 mg total) by mouth daily.  . [DISCONTINUED] albuterol (VENTOLIN HFA) 108 (90 Base) MCG/ACT inhaler Inhale 2 puffs into the lungs every 6 (six) hours as needed for wheezing or shortness of breath.    Review of Systems  Constitutional: Negative for chills, fatigue and fever.  HENT: Congestion: sometimes.   Respiratory: Positive for cough and shortness of breath. Negative for chest tightness and wheezing (sometimes).   Cardiovascular: Negative for chest pain, palpitations and leg swelling.    Objective:  BP 98/72 (BP Location: Left Arm, Patient Position: Sitting, Cuff Size: Large)   Pulse 85   Temp 97.9 F (36.6  C) (Oral)   Ht 5' (1.524 m)   Wt 210 lb 4.8 oz (95.4 kg)   SpO2 97%   BMI 41.07 kg/m   Weight: 210 lb 4.8 oz (95.4 kg)   BP Readings from Last 3 Encounters:  04/23/20 98/72  04/13/20 121/80  12/15/19 124/72   Wt Readings from Last 3 Encounters:  04/23/20 210 lb 4.8 oz (95.4 kg)  04/12/20 210 lb (95.3 kg)  12/15/19 (!) 214 lb 4.8 oz (97.2 kg)    Physical Exam Constitutional:      General: She is not in acute distress.    Appearance: She is well-developed.  HENT:      Nose:     Right Turbinates: Enlarged and pale.     Left Turbinates: Enlarged and pale.  Cardiovascular:     Rate and Rhythm: Normal rate and regular rhythm.     Heart sounds: Normal heart sounds. No murmur heard. No friction rub.  Pulmonary:     Effort: Pulmonary effort is normal. No respiratory distress.     Breath sounds: Normal breath sounds. No wheezing or rales. Decreased breath sounds: mild at bases.  Musculoskeletal:     Right lower leg: No edema.     Left lower leg: No edema.  Neurological:     Mental Status: She is alert and oriented to person, place, and time.  Psychiatric:        Behavior: Behavior normal.     Assessment/Plan  1. Moderate persistent asthma without complication Trial of xopenex rather than albuterol since this should help with tachycardia.   - levalbuterol (XOPENEX HFA) 45 MCG/ACT inhaler; Inhale 2 puffs into the lungs every 4 (four) hours as needed for wheezing or shortness of breath.  Dispense: 1 each; Refill: 12 - Spacer/Aero-Holding Rudean Curt; Use with inhaler as directed.  Dispense: 1 Device; Refill: 0  2. Tachycardia - levalbuterol (XOPENEX HFA) 45 MCG/ACT inhaler; Inhale 2 puffs into the lungs every 4 (four) hours as needed for wheezing or shortness of breath.  Dispense: 1 each; Refill: 12  3. Seasonal allergies Restart flonase, singulair, continue with zyrtec. Let me know if not better with breathing in 2-3 weeks. - Ambulatory referral to Allergy    Return in about 3 months (around 07/22/2020) for Chronic condition visit.    Theodis Shove, MD

## 2020-04-23 NOTE — Patient Instructions (Addendum)
Start flonase nasal spray. Continue zyrtec.  Start singulair.   Let me know if you cannot get the xopenex (coverage wise).

## 2020-06-14 ENCOUNTER — Encounter: Payer: Self-pay | Admitting: Allergy

## 2020-06-14 ENCOUNTER — Other Ambulatory Visit: Payer: Self-pay

## 2020-06-14 ENCOUNTER — Ambulatory Visit: Payer: 59 | Admitting: Allergy

## 2020-06-14 VITALS — BP 124/84 | HR 94 | Temp 97.7°F | Resp 18 | Ht 60.0 in | Wt 210.0 lb

## 2020-06-14 DIAGNOSIS — T781XXD Other adverse food reactions, not elsewhere classified, subsequent encounter: Secondary | ICD-10-CM

## 2020-06-14 DIAGNOSIS — T7819XD Other adverse food reactions, not elsewhere classified, subsequent encounter: Secondary | ICD-10-CM | POA: Insufficient documentation

## 2020-06-14 DIAGNOSIS — J4530 Mild persistent asthma, uncomplicated: Secondary | ICD-10-CM

## 2020-06-14 DIAGNOSIS — J3089 Other allergic rhinitis: Secondary | ICD-10-CM | POA: Diagnosis not present

## 2020-06-14 MED ORDER — ARNUITY ELLIPTA 100 MCG/ACT IN AEPB: 1.0000 | INHALATION_SPRAY | Freq: Every day | RESPIRATORY_TRACT | 5 refills | Status: DC

## 2020-06-14 MED ORDER — LEVALBUTEROL TARTRATE 45 MCG/ACT IN AERO: 2.0000 | INHALATION_SPRAY | RESPIRATORY_TRACT | 2 refills | Status: DC | PRN

## 2020-06-14 NOTE — Progress Notes (Signed)
New Patient Note  RE: Nicole [name] MRN: [id number] DOB: 02-Oct-1975 Date of Office Visit: 06/14/2020  Referring provider: Wynn Banker, MD Primary care provider: Wynn Banker, MD  Chief Complaint: Allergic Rhinitis  (At the end of November pt. had an asthma attack after 2 puffs of albuterol she felt woozy with chest palpations she went to the emergency room where they gave her a breathing treatment ) and Asthma (Was diagnosed with asthma in 2006 has a prescription for a spacer and xopenex inhaler but has not got it due to insurance not covering it. )  History of Present Illness: I had the pleasure of seeing Nicole [name] for initial evaluation at the [facility] on 06/14/2020. She is a 45 y.o. female, who is referred here by Wynn Banker, MD for the evaluation of allergic rhinitis and asthma.  Asthma:  She reports symptoms of chest tightness, shortness of breath, coughing, wheezing for 20+ years. Current medications include Proair which help but it causes palpitations. Currently on Singulair daily x few months with some benefit.  She reports not using aerochamber with inhalers. She tried the following inhalers: Advair caused thrush. Main triggers are strong emotions, cold weather. In the last month, frequency of symptoms: <1x/week. Frequency of nocturnal symptoms: 0x/month. Frequency of SABA use: <1x/week. Interference with physical activity: sometimes. In the last 12 months, emergency room visits/urgent care visits/doctor office visits or hospitalizations due to respiratory issues: one. In the last 12 months, oral steroids courses: no. Lifetime history of hospitalization for respiratory issues: no. Prior intubations: no. History of pneumonia: no. She was evaluated by allergist in the past. Smoking exposure: quit in 2007. Up to date with flu vaccine: yes. Up to date with COVID-19 vaccine: yes.  History of reflux: yes but does not take medications for  this.  Rhinitis:  She reports symptoms of rhinorrhea, nasal congestion, sneezing. Symptoms have been going on for 10+ years. The symptoms are present all year around. Other triggers include exposure to none. Anosmia: no. Headache: yes. She has used zyrtec, Singulair, Flonase with fair improvement in symptoms. No nosebleeds. Allegra and Claritin not as effective. Sinus infections: not recently. Previous work up includes: skin testing in 2006 was positive to ragweed per patient report. No prior allergy injections. Previous ENT evaluation: septoplasty and turbinate reduction 2014. Previous sinus imaging: not recently. History of nasal polyps: no.  04/23/2020 PCP visit: "1. Moderate persistent asthma without complication Trial of xopenex rather than albuterol since this should help with tachycardia.   - levalbuterol (XOPENEX HFA) 45 MCG/ACT inhaler; Inhale 2 puffs into the lungs every 4 (four) hours as needed for wheezing or shortness of breath.  Dispense: 1 each; Refill: 12 - Spacer/Aero-Holding Rudean Curt; Use with inhaler as directed.  Dispense: 1 Device; Refill: 0  2. Tachycardia - levalbuterol (XOPENEX HFA) 45 MCG/ACT inhaler; Inhale 2 puffs into the lungs every 4 (four) hours as needed for wheezing or shortness of breath.  Dispense: 1 each; Refill: 12  3. Seasonal allergies Restart flonase, singulair, continue with zyrtec. Let me know if not better with breathing in 2-3 weeks. 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  She notably mentions that the palpitations, rapid  heartbeat, dizziness, lightheadedness did not begin until after she used 2 puffs of her albuterol inhaler for shortness of breath.  Vital signs are reassuring.  No hypoxia noted.  EKG with no changes from previous.  Imaging has been reviewed and independently interpreted by me.  Chest x-ray with mild perihilar bronchial thickening suspicious for reactive airway disease or bronchitis.  New opacity or consolidations.  Labs have been reviewed and dependently interpreted by me.  Delta troponin is not elevated. HEAR score is 1.  Lipase is not elevated.  Hepatic function panel is unremarkable.  She does have a mild leukocytosis of 13.2, but is having no infectious symptoms.  Labs are otherwise reassuring.  She is PERC negative, and I have a low suspicion for PE.  Doubt aortic dissection, ACS.  On exam, lung sounds were diminished and given concern for reactive airway disease on chest x-ray, DuoNeb was ordered.  Headache has resolved on reevaluation, she reported marked improvement in shortness of breath.  She also reports that the albuterol did reproduce the palpitations that she was feeling earlier.  Discussed side effects of albuterol.  She was ambulated in the emergency department on pulse ox to maintain oxygen saturation of 96%.  She states that she is no longer feeling short of breath.  Suspect reactive airway disease exacerbation in the setting of worsening anxiety and stress.  She has good follow-up with primary care, and I encouraged her to follow closely.  No indication for cardiology referral at this time.  She is hemodynamically stable no acute distress.  ER return precautions given.  Safe for discharge home with outpatient follow-up as indicated."  04/12/2020 chest x-ray: "IMPRESSION: Mild perihilar bronchial wall thickening as can be seen with reactive airway disease/bronchitis. No focal airspace opacity."  Assessment and Plan: Nicole [name] is a 45 y.o. female with: Mild persistent asthma  without complication Diagnosed with asthma over 20 years ago.  Recent ER visit due to shortness of breath.  No recent oral prednisone use.  Main triggers are strong emotions and cold weather.  Albuterol causes palpitations.  Advair caused thrush in the past.  Taking Singulair times few months with good benefit.  Today's spirometry showed normal pattern with 11% and greater than 200 cc improvement in FEV1 post bronchodilator treatment suggestive of reactive airway disease/asthma.  Clinically feeling improved. . Daily controller medication(s): start Arnuity 1 puff daily and rinse mouth after each use. Demonstrated proper use.  o Let us know if too expensive.   Continue Singulair (montelukast) 10mg  daily at night. . May use levoalbuterol rescue inhaler 2 puffs every 4 to 6 hours as needed for shortness of breath, chest tightness, coughing, and wheezing. May use levoalbuterol rescue inhaler 2 puffs 5 to 15 minutes prior to strenuous physical activities. Monitor frequency of use.   Repeat spirometry at next visit.  Other allergic rhinitis Perennial rhinitis symptoms for 10+ years.  Tried Zyrtec, Flonase and Singulair with good benefit.  Allegra and Claritin not as effective.  Skin testing in 2006 showed multiple positives per patient report.  No prior AIT.  Sinus surgery in 2014.  Today's skin testing showed: Negative to indoor/outdoor allergens. Get bloodwork as below as already getting bloodwork for food.   May use over the counter antihistamines such as Zyrtec (cetirizine), or Xyzal (levocetirizine) daily as needed. May take twice a day if needed.  Continue Singulair (montelukast) 10mg   daily at night.  May use Flonase (fluticasone) nasal spray 1 spray per nostril twice a day as needed for nasal congestion.   Nasal saline spray (i.e., Simply Saline) or nasal saline lavage (i.e., NeilMed) is recommended as needed and prior to medicated nasal sprays.  Adverse reaction to food, subsequent  encounter Cashews and [place] nuts caused throat tightness at the age of 45 requiring ER visit.  2006 skin testing was positive to tree nuts per patient report.  Tolerates peanuts.  Today's skin testing showed: Negative to select tree nuts.  The patients history suggests tree nut allergy, though todays skin tests were negative despite a positive histamine control.  Food allergen skin testing has excellent negative predictive value however there is still a small chance that the allergy exists. Therefore, we will investigate further with serum specific IgE levels and, if negative then schedule for open graded oral food challenge. A laboratory order form has been provided for serum specific IgE against tree nuts. Until the food allergy has been definitively ruled out, the patient is to continue meticulous avoidance of tree nuts. For mild symptoms you can take over the counter antihistamines such as Benadryl and monitor symptoms closely. If symptoms worsen or if you have severe symptoms including breathing issues, throat closure, significant swelling, whole body hives, severe diarrhea and vomiting, lightheadedness then seek immediate medical care. Declines epinephrine device as she never had to use one before.   Food action plan given.  Return in about 2 months (around 08/12/2020).  Meds ordered this encounter  Medications  . levalbuterol (XOPENEX HFA) 45 MCG/ACT inhaler    Sig: Inhale 2 puffs into the lungs every 4 (four) hours as needed for wheezing or shortness of breath.    Dispense:  1 each    Refill:  2  . Fluticasone Furoate (ARNUITY ELLIPTA) 100 MCG/ACT AEPB    Sig: Inhale 1 puff into the lungs daily. Rinse mouth after each use.    Dispense:  30 each    Refill:  5    Lab Orders     Allergens w/Total IgE Area 2     IgE Nut Prof. 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 This happened only  once at age 45 and was taken to the ER.   Since this episode, she does not report other accidental exposures to tree nuts. She does not have access to epinephrine autoinjector  Past work up includes: 2006 skin testing positive to tree nuts per patient report.  Dietary History: patient has been eating other foods including milk, eggs, peanut, sesame, shellfish, fish, soy, wheat, meats, fruits and vegetables.  She reports reading labels and avoiding tree nuts in diet completely.   Medication allergy: yes Hymenoptera allergy: no Urticaria: no Eczema:no History of recurrent infections suggestive of immunodeficency: no  Diagnostics: Spirometry:  Tracings reviewed. Her effort: Good reproducible efforts. FVC: 3.27L FEV1: 2.36L, 91% predicted FEV1/FVC ratio: 72% Interpretation: Spirometry consistent with normal pattern with 11% and greater than 200cc improvement in FEV1 post bronchodilator treatment. Clinically feeling improved.  Please see scanned spirometry results for details.  Skin Testing: Environmental allergy panel and select foods. Negative to indoor/outdoor allergens. Negative to select tree nuts.  Results discussed with patient/family.  Airborne Adult Perc - 06/14/20 1000    Time Antigen Placed [id number]    Allergen Manufacturer Waynette ButteryGreer    Location Back    Number of Test 59  Panel 1 Select    1. Control-Buffer 50% Glycerol Negative    2. Control-Histamine 1 mg/ml 2+    3. Albumin saline Negative    4. Bahia Negative    5. French Southern Territories Negative    6. Johnson Negative    7. Kentucky Blue Negative    8. Meadow Fescue Negative    9. Perennial Rye Negative    10. Sweet Vernal Negative    11. Timothy Negative    12. Cocklebur Negative    13. Burweed Marshelder Negative    14. Ragweed, short Negative    15. Ragweed, Giant Negative    16. Plantain,  English Negative    17. Lamb's Quarters Negative    18. Sheep Sorrell Negative    19. Rough Pigweed Negative    20. Marsh Elder,  Rough Negative    21. Mugwort, Common Negative    22. Ash mix Negative    23. Birch mix Negative    24. Beech American Negative    25. Box, Elder Negative    26. Cedar, red Negative    27. Cottonwood, Guinea-Bissau Negative    28. Elm mix Negative    29. Hickory Negative    30. Maple mix Negative    31. Oak, Guinea-[place]issau mix Negative    32. Pecan Pollen Negative    33. Pine mix Negative    34. Sycamore Eastern Negative    35. Walnut, Black Pollen Negative    36. Alternaria alternata Negative    37. Cladosporium Herbarum Negative    38. Aspergillus mix Negative    39. Penicillium mix Negative    40. Bipolaris sorokiniana (Helminthosporium) Negative    41. Drechslera spicifera (Curvularia) Negative    42. Mucor plumbeus Negative    43. Fusarium moniliforme Negative    44. Aureobasidium pullulans (pullulara) Negative    45. Rhizopus oryzae Negative    46. Botrytis cinera Negative    47. Epicoccum nigrum Negative    48. Phoma betae Negative    49. Candida Albicans Negative    50. Trichophyton mentagrophytes Negative    51. Mite, D Farinae  5,000 AU/ml Negative    52. Mite, D Pteronyssinus  5,000 AU/ml Negative    53. Cat Hair 10,000 BAU/ml Negative    54.  Dog Epithelia Negative    55. Mixed Feathers Negative    56. Horse Epithelia Negative    57. Cockroach, German Negative    58. Mouse Negative    59. Tobacco Leaf Negative          Food Adult Perc - 06/14/20 1000    Time Antigen Placed 1000    Allergen Manufacturer Greer    Location Back    Number of allergen test 9    Control-Histamine 1 mg/ml 2+    10. Cashew Negative    11. Pecan Food Negative    12. Walnut Food Negative    13. Almond Negative    14. Hazelnut Negative    15. [place] nut Negative    16. Coconut Negative    17. Pistachio Negative    Comments n           Past Medical History: Patient Active Problem List   Diagnosis Date Noted  . Mild persistent asthma without complication 06/14/2020  . Other  allergic rhinitis 06/14/2020  . Adverse reaction to food, subsequent encounter 06/14/2020  . IBS (irritable bowel syndrome) 05/24/2016  . Chronic venous insufficiency 05/24/2016  . Migraine 05/24/2016  . Pulmonary  nodules 10/27/2014  . Hepatitis C, chronic (HCC)   . Depression 10/09/2013  . OSA on CPAP - sees Dr. Jenne Campus in ENT 10/09/2013  . Allergic rhinitis 05/30/2012  . Cough variant asthma 02/08/2012   Past Medical History:  Diagnosis Date  . Abnormal pap    w LEEP  . Alcoholism (HCC)   . Allergy   . Asthma   . Blood in stool   . Depression (emotion)   . Frequent headaches   . Hepatitis C, chronic (HCC)    hep c tx successful  . Irritable bowel syndrome   . OSA on CPAP - sees Dr. Jenne Campus in ENT 10/09/2013  . Seasonal allergies    scratch test in 2006///mold issue in house   Past Surgical History: Past Surgical History:  Procedure Laterality Date  . DILATION AND CURETTAGE OF UTERUS  1996, 2001, 2002   x 3  . LEEP  2008  . NASAL FRACTURE SURGERY  1999   repair  . NASAL SEPTOPLASTY W/ TURBINOPLASTY     Medication List:  Current Outpatient Medications  Medication Sig Dispense Refill  . ARIPiprazole (ABILIFY) 15 MG tablet Take 15 mg by mouth daily.    . cetirizine (ZYRTEC) 10 MG tablet Take 10 mg by mouth daily.    Marland Kitchen desvenlafaxine (PRISTIQ) 100 MG 24 hr tablet Take 100 mg by mouth daily.    Marland Kitchen dicyclomine (BENTYL) 10 MG capsule Take 1 capsule (10 mg total) by mouth every 8 (eight) hours as needed. 90 capsule 1  . diphenoxylate-atropine (LOMOTIL) 2.5-0.025 MG tablet Take 1 tablet by mouth 3 (three) times daily as needed for constipation.    Marland Kitchen doxepin (SINEQUAN) 10 MG capsule Take 10 mg by mouth at bedtime.     . fluticasone (FLONASE) 50 MCG/ACT nasal spray Place 2 sprays into both nostrils daily. 16 g 6  . Fluticasone Furoate (ARNUITY ELLIPTA) 100 MCG/ACT AEPB Inhale 1 puff into the lungs daily. Rinse mouth after each use. 30 each 5  . levalbuterol (XOPENEX HFA) 45  MCG/ACT inhaler Inhale 2 puffs into the lungs every 4 (four) hours as needed for wheezing or shortness of breath. 1 each 2  . levonorgestrel (MIRENA) 20 MCG/24HR IUD 1 each by Intrauterine route once.    . montelukast (SINGULAIR) 10 MG tablet Take 1 tablet (10 mg total) by mouth at bedtime. 90 tablet 1  . Multiple Vitamin (MULTIVITAMINS PO) Take 1 tablet by mouth daily.     . ondansetron (ZOFRAN ODT) 8 MG disintegrating tablet Take 1 tablet (8 mg total) by mouth every 8 (eight) hours as needed for nausea or vomiting. 30 tablet 3  . Rimegepant Sulfate (NURTEC) 75 MG TBDP Take 75 mg by mouth daily as needed (take for abortive therapy of migraine, no more than 1 tablet in 24 hours or 10 per month). 10 tablet 11  . topiramate (TOPAMAX) 25 MG tablet Take 1 tablet (25 mg total) by mouth daily. 90 tablet 3  . Spacer/Aero-Holding Rudean Curt Use with inhaler as directed. (Patient not taking: Reported on 06/14/2020) 1 Device 0   No current facility-administered medications for this visit.   Allergies: Allergies  Allergen Reactions  . Cashew Nut Oil Anaphylaxis    Cashews and [place] nuts  . Other Anaphylaxis    Nuts-Brazil and cashews  . Triptans Anaphylaxis  . Lexapro [Escitalopram Oxalate]     manic  . Cefaclor Rash  . Nickel Rash   Social History: Social History   Socioeconomic History  .  Marital status: Married    Spouse name: Not on file  . Number of children: 0  . Years of education: Not on file  . Highest education level: Not on file  Occupational History  . Occupation: Adult nurse: [employer]  Tobacco Use  . Smoking status: Former Smoker    Packs/day: 1.50    Years: 15.00    Pack years: 22.50    Types: Cigarettes    Quit date: 05/15/2005    Years since quitting: 15.0  . Smokeless tobacco: Never Used  . Tobacco comment: quit in 2007, smoked for 13 years   Vaping Use  . Vaping Use: Never used  Substance and Sexual Activity  . Alcohol use: No     Alcohol/week: 0.0 standard drinks    Comment: quit drinking on 2004---relapse in 2007---QUIT 07/2005  . Drug use: No    Comment: IV drug use///street drugs///inhalants  QUIT 07/2005  . Sexual activity: Yes    Partners: Male    Birth control/protection: Pill    Comment: First sexual intercourse at age 59,  more than 5 sexual partners   Other Topics Concern  . Not on file  Social History Narrative   Work or School: Glass blower/designer Situation: lives with husband      Spiritual Beliefs: none      Lifestyle: no regular exercise; diet is fair               Social Determinants of Corporate investment banker Strain: Not on file  Food Insecurity: Not on file  Transportation Needs: Not on file  Physical Activity: Not on file  Stress: Not on file  Social Connections: Not on file   Lives in an apartment. Smoking: quit in 2007 Occupation: Data processing manager History: Water Damage/mildew in the house: no Carpet in the family room: no Carpet in the bedroom: yes Heating: electric Cooling: central Pet: no  Family History: Family History  Problem Relation Age of Onset  . Emphysema Mother   . Colon polyps Mother   . Diverticulitis Mother   . Heart disease Father   . Cancer Father        unsure origin; had spot on lung  . Hypertension Father   . Hepatitis C Father   . Alcohol abuse Father   . Drug abuse Father   . Stroke Maternal Grandmother   . Osteoporosis Maternal Grandmother   . Alcohol abuse Maternal Grandfather   . Liver disease Maternal Grandfather    Problem                               Relation Eczema                                No  Food allergy                          No  Allergic rhino conjunctivitis     No   Review of Systems  Constitutional: Negative for appetite change, chills, fever and unexpected weight change.  HENT: Positive for congestion and rhinorrhea.   Eyes: Negative for itching.  Respiratory: Negative  for cough, chest tightness, shortness of breath and wheezing.   Cardiovascular: Negative for chest pain.  Gastrointestinal: Negative for  abdominal pain.  Genitourinary: Negative for difficulty urinating.  Skin: Negative for rash.  Neurological: Positive for headaches.   Objective: BP 124/84 (BP Location: Right Arm, Patient Position: Sitting, Cuff Size: Normal)   Pulse 94   Temp 97.7 F (36.5 C)   Resp 18   Ht 5' (1.524 m)   Wt 210 lb (95.3 kg)   SpO2 96%   BMI 41.01 kg/m  Body mass index is 41.01 kg/m. Physical Exam Vitals and nursing note reviewed.  Constitutional:      Appearance: Normal appearance. She is well-developed.  HENT:     Head: Normocephalic and atraumatic.     Right Ear: External ear normal.     Left Ear: External ear normal.     Nose: Nose normal.     Mouth/Throat:     Mouth: Mucous membranes are moist.     Pharynx: Oropharynx is clear.  Eyes:     Conjunctiva/sclera: Conjunctivae normal.  Cardiovascular:     Rate and Rhythm: Normal rate and regular rhythm.     Heart sounds: Normal heart sounds. No murmur heard. No friction rub. No gallop.   Pulmonary:     Effort: Pulmonary effort is normal.     Breath sounds: Normal breath sounds. No wheezing, rhonchi or rales.  Abdominal:     Palpations: Abdomen is soft.  Musculoskeletal:     Cervical back: Neck supple.  Skin:    General: Skin is warm.     Findings: No rash.  Neurological:     Mental Status: She is alert and oriented to person, place, and time.  Psychiatric:        Behavior: Behavior normal.    The plan was reviewed with the patient/family, and all questions/concerned were addressed.  It was my pleasure to see Nicole [name] today and participate in her care. Please feel free to contact me with any questions or concerns.  Sincerely,  Wyline Mood, DO Allergy & Immunology  [facility] office: [phone] [facility] office: [phone]

## 2020-06-14 NOTE — Assessment & Plan Note (Signed)
Perennial rhinitis symptoms for 10+ years.  Tried Zyrtec, Flonase and Singulair with good benefit.  Allegra and Claritin not as effective.  Skin testing in 2006 showed multiple positives per patient report.  No prior AIT.  Sinus surgery in 2014.  Today's skin testing showed: Negative to indoor/outdoor allergens. Get bloodwork as below as already getting bloodwork for food.   May use over the counter antihistamines such as Zyrtec (cetirizine), or Xyzal (levocetirizine) daily as needed. May take twice a day if needed.  Continue Singulair (montelukast) 10mg  daily at night.  May use Flonase (fluticasone) nasal spray 1 spray per nostril twice a day as needed for nasal congestion.   Nasal saline spray (i.e., Simply Saline) or nasal saline lavage (i.e., NeilMed) is recommended as needed and prior to medicated nasal sprays.

## 2020-06-14 NOTE — Assessment & Plan Note (Addendum)
Diagnosed with asthma over 20 years ago.  Recent ER visit due to shortness of breath.  No recent oral prednisone use.  Main triggers are strong emotions and cold weather.  Albuterol causes palpitations.  Advair caused thrush in the past.  Taking Singulair times few months with good benefit.  Today's spirometry showed normal pattern with 11% and greater than 200 cc improvement in FEV1 post bronchodilator treatment suggestive of reactive airway disease/asthma.  Clinically feeling improved. . Daily controller medication(s): start Arnuity 1 puff daily and rinse mouth after each use. Demonstrated proper use.  o Let us know if too expensive.   Continue Singulair (montelukast) 10mg  daily at night. . May use levoalbuterol rescue inhaler 2 puffs every 4 to 6 hours as needed for shortness of breath, chest tightness, coughing, and wheezing. May use levoalbuterol rescue inhaler 2 puffs 5 to 15 minutes prior to strenuous physical activities. Monitor frequency of use.   Repeat spirometry at next visit.

## 2020-06-14 NOTE — Patient Instructions (Addendum)
Today's skin testing showed: Negative to indoor/outdoor allergens. Negative to select tree nuts.   Rhinitis:  Get bloodwork:  We are ordering labs, so please allow 1-2 weeks for the results to come back. With the newly implemented Cures Act, the labs might be visible to you at the same time that they become visible to me. However, I will not address the results until all of the results are back, so please be patient.  In the meantime, continue recommendations in your patient instructions, including avoidance measures (if applicable), until you hear from me.  May use over the counter antihistamines such as Zyrtec (cetirizine), or Xyzal (levocetirizine) daily as needed. May take twice a day if needed.  Continue Singulair (montelukast) 10mg  daily at night.  May use Flonase (fluticasone) nasal spray 1 spray per nostril twice a day as needed for nasal congestion.   Nasal saline spray (i.e., Simply Saline) or nasal saline lavage (i.e., NeilMed) is recommended as needed and prior to medicated nasal sprays.  Asthma: . Daily controller medication(s): start Arnuity 1 puff daily and rinse mouth after each use. Demonstrated proper use.  o Let know if too expensive and which one covered.  Continue Singulair (montelukast) 10mg  daily at night. . May use levoalbuterol rescue inhaler 2 puffs every 4 to 6 hours as needed for shortness of breath, chest tightness, coughing, and wheezing. May use levoalbuterol rescue inhaler 2 puffs 5 to 15 minutes prior to strenuous physical activities. Monitor frequency of use.  . Asthma control goals:  o Full participation in all desired activities (may need albuterol before activity) o Albuterol use two times or less a week on average (not counting use with activity) o Cough interfering with sleep two times or less a month o Oral steroids no more than once a year o No hospitalizations  Food: The patients history suggests tree nut allergy, though todays  skin tests were negative despite a positive histamine control.  Food allergen skin testing has excellent negative predictive value however there is still a small chance that the allergy exists. Therefore, we will investigate further with serum specific IgE levels and, if negative then schedule for open graded oral food challenge. A laboratory order form has been provided for serum specific IgE against tree nuts. Until the food allergy has been definitively ruled out, the patient is to continue meticulous avoidance of tree nuts. For mild symptoms you can take over the counter antihistamines such as Benadryl and monitor symptoms closely. If symptoms worsen or if you have severe symptoms including breathing issues, throat closure, significant swelling, whole body hives, severe diarrhea and vomiting, lightheadedness then seek immediate medical care.  Food action plan given.  Follow up in 2 months or sooner if needed to check on your breathing.

## 2020-06-14 NOTE — Assessment & Plan Note (Signed)
Cashews and [place] nuts caused throat tightness at the age of 63 requiring ER visit.  2006 skin testing was positive to tree nuts per patient report.  Tolerates peanuts.  Today's skin testing showed: Negative to select tree nuts.  The patients history suggests tree nut allergy, though todays skin tests were negative despite a positive histamine control.  Food allergen skin testing has excellent negative predictive value however there is still a small chance that the allergy exists. Therefore, we will investigate further with serum specific IgE levels and, if negative then schedule for open graded oral food challenge. A laboratory order form has been provided for serum specific IgE against tree nuts. Until the food allergy has been definitively ruled out, the patient is to continue meticulous avoidance of tree nuts. For mild symptoms you can take over the counter antihistamines such as Benadryl and monitor symptoms closely. If symptoms worsen or if you have severe symptoms including breathing issues, throat closure, significant swelling, whole body hives, severe diarrhea and vomiting, lightheadedness then seek immediate medical care. Declines epinephrine device as she never had to use one before.   Food action plan given.

## 2020-06-16 ENCOUNTER — Telehealth: Payer: Self-pay | Admitting: Family Medicine

## 2020-06-16 NOTE — Telephone Encounter (Signed)
Pt. states she has had a headache the last 3 days & she is wanting to be seen for an acute condition. Please advise.

## 2020-06-16 NOTE — Telephone Encounter (Signed)
Called pt and relayed per VM that no appt with AL/NP acutely unless cancellation.  Last seen 09-23-2019. May see pcp or urgent care.  Will be glad to send information to AL/NP and see what she recommends.  Let us know.

## 2020-06-17 ENCOUNTER — Encounter (HOSPITAL_COMMUNITY): Payer: Self-pay | Admitting: Emergency Medicine

## 2020-06-17 ENCOUNTER — Other Ambulatory Visit: Payer: Self-pay

## 2020-06-17 ENCOUNTER — Ambulatory Visit (HOSPITAL_COMMUNITY)
Admission: EM | Admit: 2020-06-17 | Discharge: 2020-06-17 | Disposition: A | Discharge status: Home / Self Care | Payer: No Typology Code available for payment source | Attending: Family Medicine | Admitting: Family Medicine

## 2020-06-17 DIAGNOSIS — G43819 Other migraine, intractable, without status migrainosus: Secondary | ICD-10-CM

## 2020-06-17 MED ORDER — DEXAMETHASONE SODIUM PHOSPHATE 10 MG/ML IJ SOLN
10.0000 mg | Freq: Once | INTRAMUSCULAR | Status: AC
  Administered 2020-06-17: 10 mg via INTRAMUSCULAR

## 2020-06-17 MED ORDER — KETOROLAC TROMETHAMINE 60 MG/2ML IM SOLN
60.0000 mg | Freq: Once | INTRAMUSCULAR | Status: AC
  Administered 2020-06-17: 60 mg via INTRAMUSCULAR

## 2020-06-17 MED ORDER — KETOROLAC TROMETHAMINE 60 MG/2ML IM SOLN
INTRAMUSCULAR | Status: AC
  Filled 2020-06-17: qty 2

## 2020-06-17 MED ORDER — DEXAMETHASONE SODIUM PHOSPHATE 10 MG/ML IJ SOLN
INTRAMUSCULAR | Status: AC
  Filled 2020-06-17: qty 1

## 2020-06-17 MED ORDER — METOCLOPRAMIDE HCL 5 MG/ML IJ SOLN
INTRAMUSCULAR | Status: AC
  Filled 2020-06-17: qty 2

## 2020-06-17 MED ORDER — METOCLOPRAMIDE HCL 5 MG/ML IJ SOLN
5.0000 mg | Freq: Once | INTRAMUSCULAR | Status: AC
  Administered 2020-06-17: 5 mg via INTRAMUSCULAR

## 2020-06-17 NOTE — ED Triage Notes (Signed)
Pt presents with migraine, blurred vision, and dizziness xs 3 days. States migraine not responding to migraine treatment.

## 2020-06-17 NOTE — ED Provider Notes (Signed)
[facility]   [id number] 06/17/20 Arrival Time: 1752  ASSESSMENT & PLAN:  1. Other migraine without status migrainosus, intractable     Meds ordered this encounter  Medications  . metoCLOPramide (REGLAN) injection 5 mg  . dexamethasone (DECADRON) injection 10 mg  . ketorolac (TORADOL) injection 60 mg   Current presentation and symptoms are consistent with prior migraines and are not consistent with SAH, ICH, meningitis, or temporal arteritis. Without fever, focal neuro logical deficits, nuchal rigidity, or change in vision. No indication for neurodiagnostic workup at this time.  Work note given.  Recommend:  Follow-up Information    [facility] EMERGENCY DEPARTMENT.   Specialty: Emergency Medicine Why: If symptoms worsen in any way. Contact information: [address] [id number] mc [place] [address] [phone]              Reviewed expectations re: course of current medical issues. Questions answered. Outlined signs and symptoms indicating need for more acute intervention. Patient verbalized understanding. After Visit Summary given.   SUBJECTIVE: History from: Patient Patient is able to give a clear and coherent history.  Nicole [name] is a 45 y.o. female who presents with complaint of a migraine headache. Onset gradual, 3 d ago. Location: occipital without radiation. History of headaches: yes; same symptoms. Precipitating factors include: none which have been determined. Associated symptoms: Preceding aura: no. Nausea/vomiting: yes. Vision changes: no. Increased sensitivity to light and to noises: yes. Fever: no. Sinus pressure/congestion: no. Extremity weakness: no. Home treatment has included migraine Rx x 2 with no improvement. Current headache has limited normal daily activities; has missed work. Denies loss of balance, muscle weakness, numbness of extremities and speech difficulties. No head injury  reported. Ambulatory without difficulty. No recent travel.   OBJECTIVE:  Vitals:   06/17/20 1837  BP: 101/61  Pulse: 100  Resp: 18  Temp: 98.9 F (37.2 C)  TempSrc: Oral  SpO2: 96%    General appearance: alert; NAD but appears fatigued HENT: normocephalic; atraumatic Eyes: PERRLA; EOMI; conjunctivae normal Neck: supple with FROM Lungs: unlabored respirations Extremities: no edema; symmetrical with no gross deformities Skin: warm and dry Neurologic: alert; speech is fluent and clear without dysarthria or aphasia; CN 2-12 grossly intact; Psychological: alert and cooperative; normal mood and affect    Allergies  Allergen Reactions  . Cashew Nut Oil Anaphylaxis    Cashews and [place] nuts  . Other Anaphylaxis    Nuts-[place] and cashews  . Triptans Anaphylaxis  . Lexapro [Escitalopram Oxalate]     manic  . Cefaclor Rash  . Nickel Rash    Past Medical History:  Diagnosis Date  . Abnormal pap    w LEEP  . Alcoholism (HCC)   . Allergy   . Asthma   . Blood in stool   . Depression (emotion)   . Frequent headaches   . Hepatitis C, chronic (HCC)    hep c tx successful  . Irritable bowel syndrome   . OSA on CPAP - sees Dr. Jenne Campus in ENT 10/09/2013  . Seasonal allergies    scratch test in 2006///mold issue in house   Social History   Socioeconomic History  . Marital status: Married    Spouse name: Not on file  . Number of children: 0  . Years of education: Not on file  . Highest education level: Not on file  Occupational History  . Occupation: Adult nurse: [employer]  Tobacco Use  .  Smoking status: Former Smoker    Packs/day: 1.50    Years: 15.00    Pack years: 22.50    Types: Cigarettes    Quit date: 05/15/2005    Years since quitting: 15.1  . Smokeless tobacco: Never Used  . Tobacco comment: quit in 2007, smoked for 13 years   Vaping Use  . Vaping Use: Never used  Substance and Sexual Activity  . Alcohol use: No     Alcohol/week: 0.0 standard drinks    Comment: quit drinking on 2004---relapse in 2007---QUIT 07/2005  . Drug use: No    Comment: IV drug use///street drugs///inhalants  QUIT 07/2005  . Sexual activity: Yes    Partners: Male    Birth control/protection: Pill    Comment: First sexual intercourse at age 78,  more than 5 sexual partners   Other Topics Concern  . Not on file  Social History Narrative   Work or School: Glass blower/designer Situation: lives with husband      Spiritual Beliefs: none      Lifestyle: no regular exercise; diet is fair               Social Determinants of Corporate investment banker Strain: Not on file  Food Insecurity: Not on file  Transportation Needs: Not on file  Physical Activity: Not on file  Stress: Not on file  Social Connections: Not on file  Intimate Partner Violence: Not on file   Family History  Problem Relation Age of Onset  . Emphysema Mother   . Colon polyps Mother   . Diverticulitis Mother   . Heart disease Father   . Cancer Father        unsure origin; had spot on lung  . Hypertension Father   . Hepatitis C Father   . Alcohol abuse Father   . Drug abuse Father   . Stroke Maternal Grandmother   . Osteoporosis Maternal Grandmother   . Alcohol abuse Maternal Grandfather   . Liver disease Maternal Grandfather    Past Surgical History:  Procedure Laterality Date  . DILATION AND CURETTAGE OF UTERUS  1996, 2001, 2002   x 3  . LEEP  2008  . NASAL FRACTURE SURGERY  1999   repair  . NASAL SEPTOPLASTY W/ TURBINOPLASTY       Mardella Layman, MD 06/17/20 Jerene Bears

## 2020-06-17 NOTE — Telephone Encounter (Signed)
I called pt and no availability today.  She had not been to urgent care or pcp.  Migraine for 3 days trigger she feels is stress.  Level now 3-4, intermittent pain intensity.  Light sounds , nausea, some light headedness. Base of neck and frontal head pain.  Taking nurtec/topamax ok.  Had been doing well until this.  Made RV for 07-13-20 15 min, for migraines, if has been doing ok.  She said that would be fine.  She will go to urgent care if needed later. Cannot come in today due to work.

## 2020-06-19 LAB — ALLERGENS W/TOTAL IGE AREA 2
Alternaria Alternata IgE: <0.1 kU/L
Aspergillus Fumigatus IgE: <0.1 kU/L
Bermuda Grass IgE: <0.1 kU/L
Cat Dander IgE: <0.1 kU/L
Cedar, Mountain IgE: <0.1 kU/L
Cladosporium Herbarum IgE: <0.1 kU/L
Cockroach, German IgE: <0.1 kU/L
Common Silver Birch IgE: <0.1 kU/L
Cottonwood IgE: <0.1 kU/L
D Farinae IgE: <0.1 kU/L
D Pteronyssinus IgE: <0.1 kU/L
Dog Dander IgE: <0.1 kU/L
Elm, American IgE: <0.1 kU/L
IgE (Immunoglobulin E), Serum: 3 IU/mL — ABNORMAL LOW (ref 6–495)
Johnson Grass IgE: <0.1 kU/L
Maple/Box Elder IgE: <0.1 kU/L
Mouse Urine IgE: <0.1 kU/L
Oak, White IgE: <0.1 kU/L
Pecan, Hickory IgE: <0.1 kU/L
Penicillium Chrysogen IgE: <0.1 kU/L
Pigweed, Rough IgE: <0.1 kU/L
Ragweed, Short IgE: <0.1 kU/L
Sheep Sorrel IgE Qn: <0.1 kU/L
Timothy Grass IgE: <0.1 kU/L
White Mulberry IgE: <0.1 kU/L

## 2020-06-19 LAB — IGE NUT PROF. W/COMPONENT RFLX
F017-IgE Hazelnut (Filbert): <0.1 kU/L
F018-IgE Brazil Nut: <0.1 kU/L
F020-IgE Almond: <0.1 kU/L
F202-IgE Cashew Nut: <0.1 kU/L
F203-IgE Pistachio Nut: <0.1 kU/L
F256-IgE Walnut: <0.1 kU/L
Macadamia Nut, IgE: <0.1 kU/L
Peanut, IgE: <0.1 kU/L
Pecan Nut IgE: <0.1 kU/L

## 2020-07-13 ENCOUNTER — Encounter: Payer: Self-pay | Admitting: Family Medicine

## 2020-07-13 ENCOUNTER — Telehealth (INDEPENDENT_AMBULATORY_CARE_PROVIDER_SITE_OTHER): Payer: No Typology Code available for payment source | Admitting: Family Medicine

## 2020-07-13 DIAGNOSIS — Z9989 Dependence on other enabling machines and devices: Secondary | ICD-10-CM | POA: Diagnosis not present

## 2020-07-13 DIAGNOSIS — G4733 Obstructive sleep apnea (adult) (pediatric): Secondary | ICD-10-CM | POA: Diagnosis not present

## 2020-07-13 DIAGNOSIS — G43109 Migraine with aura, not intractable, without status migrainosus: Secondary | ICD-10-CM

## 2020-07-13 MED ORDER — NURTEC 75 MG PO TBDP: 75.0000 mg | ORAL_TABLET | Freq: Every day | ORAL | 11 refills | Status: DC | PRN

## 2020-07-13 MED ORDER — TOPIRAMATE 25 MG PO TABS: 50.0000 mg | ORAL_TABLET | Freq: Every day | ORAL | 3 refills | Status: DC

## 2020-07-13 NOTE — Progress Notes (Signed)
PATIENT: Nicole [name] DOB: 1975-06-19  REASON FOR VISIT: follow up HISTORY FROM: patient  Virtual Visit via Telephone Note  I connected with Nicole [name] on 07/13/20 at  7:45 AM EST by telephone and verified that I am speaking with the correct person using two identifiers.   I discussed the limitations, risks, security and privacy concerns of performing an evaluation and management service by telephone and the availability of in person appointments. I also discussed with the patient that there may be a patient responsible charge related to this service. The patient expressed understanding and agreed to proceed.   History of Present Illness:  07/13/20 ALL:  Nicole [name] is a 45 y.o. female here today for follow up for OSA on CPAP and migraines. She reports that migraines have worsened. She continues topiramate 25mg  daily and Nurtec for abortive therapy. She has about 4-5 headache days per month with 3-4 being migrainous. Nurtec works about half of the time. She feels stress is most likely trigger. She is having more stress at work. She has an appt with her psychiatrist on March 10th to discuss adding anxiety medications. She is doing very well on CPAP. She is using CPAP nightly.    Compliance Report Usage 06/12/2020 - 07/11/2020 Usage days 30/30 days (100%) >= 4 hours 27 days (90%) Average usage (days used) 7 hours 33 minutes AirSense 10 AutoSet Serial number 07/13/2020 Mode AutoSet Min Pressure 6 cmH2O Max Pressure 12 cmH2O EPR Fulltime EPR level 3 Response Standard Therapy Pressure - cmH2O Median: 10.3 95th percentile: 11.6 Maximum: 11.7 Leaks - L/min Median: 0.0 95th percentile: 11.3 Maximum: 27.4 Events per hour AI: 3.0 HI: 0.4 AHI: 3.4 Apnea Index Central: 0.9 Obstructive: 1.9 Unknown: 0.1    09/23/19 ALL:  Nicole [name] is a 45 y.o. female here today for follow up for obstructive sleep apnea on CPAP as well as migraines she reports doing very well on CPAP therapy.  She is  using CPAP nightly and for greater than 4 hours each night.  She denies any concerns with her mask or supplies.  She does note improved sleep quality with using CPAP.  She also feels that frequency of headaches has reduced slightly but she continues to have regular, debilitating headaches.  She has taken 46 but does not feel that its effective.  She is also used over-the-counter analgesics on occasion but denies regular use.  She reports having at least 10-12 headache days per month.  She feels that 3-4 of these are migrainous.  She is interested in considering a preventative medication.  She does have a significant history of depression but feels that she is very well managed on current treatment plan.  She is seen regularly by psychiatry.  She also has a history of asthma but states that this is mild intermittent and she rarely uses Ventolin.  She denies any history of kidney stones.  Compliance report dated 08/23/2019 through 09/21/2019 reveals that she has used CPAP every night for compliance of 100%.  She is used CPAP greater than 4 hours every night for compliance of 100%.  Residual AHI is 5.0 on 6 to 12 cm of water and an EPR of 3.  There was no significant leak noted.  HISTORY: (copied from Dr 11/21/2019 note on 05/19/2019)  Dear Dr. 07/17/2019,   I saw Nicole [name], upon your kind request in my sleep clinic today for initial consultation of her obstructive sleep apnea. The patient is unaccompanied today. I recently  saw her on 04/28/2019 for evaluation of her recurrent migraines. I provided a prescription for Flexeril as needed and Ubrelvy as needed. As you know, 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 Prior sleep study results are not available for my review today. Her Epworth sleepiness score is 6 out of 24 today. She is compliant with her current AutoPap machine which is S9 AutoSet. In the past 90 days she has used her machine is days greater than 4 hours at 97%, indicating excellent compliance, average usage of 8 hours and 41 minutes, dates: 02/19/2019 through 05/19/2019. Average AHI at goal at 3.6/h, average 95th percentile of pressure at 10.5 cm with a Default range of 4 to 20 cm, leak consistently high with a 95th percentile at 56.3 L/min. She reports that she is a side sleeper. Her mask is a Quatro full facemask size small. She indicates full compliance with her AutoPap machine but she has seen an error message from the machine indicating problems with the motor. She is doing a little better as far as her headaches, she has been using Flexeril nearly nightly. She has tried Vanu[place]ubrelvy once for a migraine that started in the evening. She had no side effects and the next day she had no residual migrainous symptoms. Her bedtime is generally between 930 and 10 PM and rise time around 8. She suspects that her father had sleep apnea, he passed away at 2865. She denies night to night nocturia.  She had a brain MRI with and without contrast on 05/07/2019 and I reviewed the results: IMPRESSION:   Normal MRI brain (with and without).   The patient's allergies, current medications, family history, past medical history, past social history, past surgical history and problem list were reviewed and updated as appropriate.  Previously:   04/28/2019: (She) reports recurrent headaches/migraines for the past several years. She presented to the emergency room on 04/09/2019 with headaches, she was treated symptomatically with IV fluids, Toradol, Compazine, Benadryl, and Decadron. She reported  throat tightness and chest tightness after taking Maxalt. She had a similar response to Imitrex in the past. I reviewed the emergency room records. [facility] but never actually had an appointment, canceled twice, from what I can see.  She has not had a brain MRI. She had a head CT in 2000 which was negative for any acute findings. She is not aware of any family history of migraines but also reports that she does not know her full family history. She has been referred to pulmonary sleep medicine for her sleep apnea but has not had a visit yet. She has an older CPAP machine. Her weight has been fluctuating, lately she has gained. She has been working from home since the pandemic, she works as a Radiographer, therapeuticdigital marketing specialist. She quit smoking in 2007, she tries to hydrate well with water. She reports a migraine about once a month, she can tell by her visual aura. She has associated photophobia and sonophobia and nausea, typically no vomiting. She tried the Maxalt once but got scared after having the chest tightness and throat tightness. She was advised not to continue to take the  Fioricet for fear of rebound headaches when she went to the ER. She has not found the Fioricet helpful. She was also discouraged to use ibuprofen by her primary care physician. She has other intermittent headaches that started in the back of her head and radiate forward to the temples. She can tell the difference between those 2 headaches as the migraine headache is often one-sided and throbbing associated with the light sensitivity and nausea and preceded by the aura. She has noted dehydration as a trigger and tries to hydrate well and also utilizes Pedialyte up to twice a week. She lives with her husband, they have no children, no pets in the household and she limits her caffeine to 2 cups of coffee per day. She tries to keep a set sleep schedule and tries to achieve about 8 hours  of sleep. She had an eye examination this year in April and her prescription eyeglasses are up-to-date, her prescription tends to change with every visit.   Observations/Objective:  Generalized: Well developed, in no acute distress  Mentation: Alert oriented to time, place, history taking. Follows all commands speech and language fluent   Assessment and Plan:  45 y.o. year old female  has a past medical history of Abnormal pap, Alcoholism (HCC), Allergy, Asthma, Blood in stool, Depression (emotion), Frequent headaches, Hepatitis C, chronic (HCC), Irritable bowel syndrome, OSA on CPAP - sees Dr. Jenne Campus in ENT (10/09/2013), and Seasonal allergies. here with    ICD-10-CM   1. Migraine with aura and without status migrainosus, not intractable  G43.109 topiramate (TOPAMAX) 25 MG tablet    Rimegepant Sulfate (NURTEC) 75 MG TBDP  2. OSA on CPAP  G47.33 For home use only DME continuous positive airway pressure (CPAP)   Z99.89 Rimegepant Sulfate (NURTEC) 75 MG TBDP     Marco feels that topiramate has helped. Nurtec as worked better than most other abortive meds but does not abort all headaches. She has tried and failed [place]. She is allergic to triptan medications. We will increase topiramate to 50mg  daily at bedtime to see if this will help with severity of migraine. She will work closely with PCP and psychiatry for mood management. She will continue CPAP nightly and greater than 4 hours each night. Healthy lifestyle habits encouraged. She will follow up with me in 3 months. She verbalizes understanding and agreement with this plan.   Orders Placed This Encounter  Procedures  . For home use only DME continuous positive airway pressure (CPAP)    Supplies    Order Specific Question:   Length of Need    Answer:   Lif[employer]    Order Specific Question:   Patient has OSA or probable OSA    Answer:   Yes    Order Specific Question:   Is the patient currently using CPAP in the home    Answer:   Yes     Order Specific Question:   Settings    Answer:   Other see comments    Order Specific Question:   CPAP supplies needed    Answer:   Mask, headgear, cushions, filters, heated tubing and water chamber    Meds ordered this encounter  Medications  . topiramate (TOPAMAX) 25 MG tablet    Sig: Take 2 tablets (50 mg total) by mouth daily.    Dispense:  180 tablet    Refill:  3    Order Specific Question:   Supervising Provider    Answer:   Anson Fret  .  Rimegepant Sulfate (NURTEC) 75 MG TBDP    Sig: Take 75 mg by mouth daily as needed (take for abortive therapy of migraine, no more than 1 tablet in 24 hours or 10 per month).    Dispense:  10 tablet    Refill:  11    Order Specific Question:   Supervising Provider    Answer:   Anson Fret [[id number]]     Follow Up Instructions:  I discussed the assessment and treatment plan with the patient. The patient was provided an opportunity to ask questions and all were answered. The patient agreed with the plan and demonstrated an understanding of the instructions.   The patient was advised to call back or seek an in-person evaluation if the symptoms worsen or if the condition fails to improve as anticipated.  I provided 15 minutes of non-face-to-face time during this encounter. Patient located at their place of residence during Mychart visit. Provider is in the office.    Shawnie Dapper, NP   Made any corrections needed, and agree with history, physical, neuro exam,assessment and plan as stated.     Naomie Dean, MD [facility]

## 2020-07-14 ENCOUNTER — Telehealth: Payer: Self-pay

## 2020-07-14 NOTE — Telephone Encounter (Signed)
PA for Nurtec submitted via cover my meds (Key: [id number])  Your information has been submitted to [employer]. To check for an updated outcome later, reopen this PA request from your dashboard.  If [employer] has not responded to your request within 24 hours, contact [employer] at [phone]. If you think there may be a problem with your PA request, use our live chat feature at the bottom right.

## 2020-07-15 NOTE — Progress Notes (Signed)
Cm sent to [facility] 

## 2020-07-19 NOTE — Telephone Encounter (Signed)
PA requires additional information. I have sent again via CMM.  (Key: [id number])  Your information has been submitted to [employer]. To check for an updated outcome later, reopen this PA request from your dashboard.  If [employer] has not responded to your request within 24 hours, contact [employer] at [phone]. If you think there may be a problem with your PA request, use our live chat feature at the bottom right.

## 2020-07-19 NOTE — Telephone Encounter (Signed)
Additional information was processed through [facility]. Additional information was approved for the medication.  Approval was made for 07/19/20-07/19/21.

## 2020-07-23 ENCOUNTER — Ambulatory Visit: Payer: 59 | Admitting: Family Medicine

## 2020-07-26 ENCOUNTER — Encounter: Payer: Self-pay | Admitting: Physician Assistant

## 2020-07-26 ENCOUNTER — Telehealth: Payer: No Typology Code available for payment source | Admitting: Physician Assistant

## 2020-07-26 DIAGNOSIS — J019 Acute sinusitis, unspecified: Secondary | ICD-10-CM | POA: Diagnosis not present

## 2020-07-26 MED ORDER — DEXTROMETHORPHAN HBR 15 MG/5ML PO SYRP: 10.0000 mL | ORAL_SOLUTION | Freq: Four times a day (QID) | ORAL | 0 refills | Status: DC | PRN

## 2020-07-26 MED ORDER — DOXYCYCLINE HYCLATE 100 MG PO CAPS: 100.0000 mg | ORAL_CAPSULE | Freq: Two times a day (BID) | ORAL | 0 refills | Status: AC

## 2020-07-26 NOTE — Patient Instructions (Signed)
Sinusitis, Adult Sinusitis is inflammation of your sinuses. Sinuses are hollow spaces in the bones around your face. Your sinuses are located:  Around your eyes.  In the middle of your forehead.  Behind your nose.  In your cheekbones. Mucus normally drains out of your sinuses. When your nasal tissues become inflamed or swollen, mucus can become trapped or blocked. This allows bacteria, viruses, and fungi to grow, which leads to infection. Most infections of the sinuses are caused by a virus. Sinusitis can develop quickly. It can last for up to 4 weeks (acute) or for more than 12 weeks (chronic). Sinusitis often develops after a cold. What are the causes? This condition is caused by anything that creates swelling in the sinuses or stops mucus from draining. This includes:  Allergies.  Asthma.  Infection from bacteria or viruses.  Deformities or blockages in your nose or sinuses.  Abnormal growths in the nose (nasal polyps).  Pollutants, such as chemicals or irritants in the air.  Infection from fungi (rare). What increases the risk? You are more likely to develop this condition if you:  Have a weak body defense system (immune system).  Do a lot of swimming or diving.  Overuse nasal sprays.  Smoke. What are the signs or symptoms? The main symptoms of this condition are pain and a feeling of pressure around the affected sinuses. Other symptoms include:  Stuffy nose or congestion.  Thick drainage from your nose.  Swelling and warmth over the affected sinuses.  Headache.  Upper toothache.  A cough that may get worse at night.  Extra mucus that collects in the throat or the back of the nose (postnasal drip).  Decreased sense of smell and taste.  Fatigue.  A fever.  Sore throat.  Bad breath. How is this diagnosed? This condition is diagnosed based on:  Your symptoms.  Your medical history.  A physical exam.  Tests to find out if your condition is  acute or chronic. This may include: ? Checking your nose for nasal polyps. ? Viewing your sinuses using a device that has a light (endoscope). ? Testing for allergies or bacteria. ? Imaging tests, such as an MRI or CT scan. In rare cases, a bone biopsy may be done to rule out more serious types of fungal sinus disease. How is this treated? Treatment for sinusitis depends on the cause and whether your condition is chronic or acute.  If caused by a virus, your symptoms should go away on their own within 10 days. You may be given medicines to relieve symptoms. They include: ? Medicines that shrink swollen nasal passages (topical intranasal decongestants). ? Medicines that treat allergies (antihistamines). ? A spray that eases inflammation of the nostrils (topical intranasal corticosteroids). ? Rinses that help get rid of thick mucus in your nose (nasal saline washes).  If caused by bacteria, your health care provider may recommend waiting to see if your symptoms improve. Most bacterial infections will get better without antibiotic medicine. You may be given antibiotics if you have: ? A severe infection. ? A weak immune system.  If caused by narrow nasal passages or nasal polyps, you may need to have surgery. Follow these instructions at home: Medicines  Take, use, or apply over-the-counter and prescription medicines only as told by your health care provider. These may include nasal sprays.  If you were prescribed an antibiotic medicine, take it as told by your health care provider. Do not stop taking the antibiotic even if you start   to feel better. Hydrate and humidify  Drink enough fluid to keep your urine pale yellow. Staying hydrated will help to thin your mucus.  Use a cool mist humidifier to keep the humidity level in your home above 50%.  Inhale steam for 10-15 minutes, 3-4 times a day, or as told by your health care provider. You can do this in the bathroom while a hot shower is  running.  Limit your exposure to cool or dry air.   Rest  Rest as much as possible.  Sleep with your head raised (elevated).  Make sure you get enough sleep each night. General instructions  Apply a warm, moist washcloth to your face 3-4 times a day or as told by your health care provider. This will help with discomfort.  Wash your hands often with soap and water to reduce your exposure to germs. If soap and water are not available, use hand sanitizer.  Do not smoke. Avoid being around people who are smoking (secondhand smoke).  Keep all follow-up visits as told by your health care provider. This is important.   Contact a health care provider if:  You have a fever.  Your symptoms get worse.  Your symptoms do not improve within 10 days. Get help right away if:  You have a severe headache.  You have persistent vomiting.  You have severe pain or swelling around your face or eyes.  You have vision problems.  You develop confusion.  Your neck is stiff.  You have trouble breathing. Summary  Sinusitis is soreness and inflammation of your sinuses. Sinuses are hollow spaces in the bones around your face.  This condition is caused by nasal tissues that become inflamed or swollen. The swelling traps or blocks the flow of mucus. This allows bacteria, viruses, and fungi to grow, which leads to infection.  If you were prescribed an antibiotic medicine, take it as told by your health care provider. Do not stop taking the antibiotic even if you start to feel better.  Keep all follow-up visits as told by your health care provider. This is important. This information is not intended to replace advice given to you by your health care provider. Make sure you discuss any questions you have with your health care provider. Document Revised: 10/01/2017 Document Reviewed: 10/01/2017 Elsevier Patient Education  2021 [employer]  

## 2020-07-26 NOTE — Progress Notes (Signed)
[name], Nicole [name] are scheduled for a virtual visit with your provider today.    Just as we do with appointments in the office, we must obtain your consent to participate.    If you have a MyChart account, I can also send a copy of this consent to you electronically.  All virtual visits are billed to your insurance company just like a traditional visit in the office.  As this is a virtual visit, video technology does not allow for your provider to perform a traditional examination.  This may limit your provider's ability to fully assess your condition.  If your provider identifies any concerns that need to be evaluated in person or the need to arrange testing such as labs, EKG, etc, we will make arrangements to do so.    Although advances in technology are sophisticated, we cannot ensure that it will always work on either your end or our end.  If the connection with a video visit is poor, we may have to switch to a telephone visit.  With either a video or telephone visit, we are not always able to ensure that we have a secure connection.   I need to obtain your verbal consent now.   Are you willing to proceed with your visit today?   Nicole [name] has provided verbal consent on 07/26/2020 for a virtual visit (video or telephone).   Karrie Meres, PA-C 07/26/2020  11:51 AM   Nicole [name],you are scheduled for a virtual visit with your provider today.    Just as we do with appointments in the office, we must obtain your consent to participate.  Your consent will be active for this visit and any virtual visit you may have with one of our providers in the next 365 days.    If you have a MyChart account, I can also send a copy of this consent to you electronically.  All virtual visits are billed to your insurance company just like a traditional visit in the office.  As this is a virtual visit, video technology does not allow for your provider to perform a traditional examination.  This may limit your  provider's ability to fully assess your condition.  If your provider identifies any concerns that need to be evaluated in person or the need to arrange testing such as labs, EKG, etc, we will make arrangements to do so.    Although advances in technology are sophisticated, we cannot ensure that it will always work on either your end or our end.  If the connection with a video visit is poor, we may have to switch to a telephone visit.  With either a video or telephone visit, we are not always able to ensure that we have a secure connection.   I need to obtain your verbal consent now.   Are you willing to proceed with your visit today?   Nicole [name] has provided verbal consent on 07/26/2020 for a virtual visit (video or telephone).   Karrie Meres, PA-C 07/26/2020  11:51 AM   Date:  07/26/2020   ID:  Nicole [name], DOB 03-Mar-1976, MRN [id number]  Patient Location: Home Provider Location: Home Office   Participants: Patient and Provider for Visit and Wrap up  Method of visit: Video  Location of Patient: Home Location of Provider: Home Office Consent was obtain for visit over the video. Services rendered by provider: Visit was performed via video  A video enabled telemedicine application was used and  I verified that I am speaking with the correct person using two identifiers.  PCP:  Wynn Banker, MD   Chief Complaint:  Nasal congestion  History of Present Illness:    Nicole [name] is a 45 y.o. female with history as stated below. Presents video telehealth for an acute care visit     Pt reports sinus congestion, sinus headache, and sinus pressure for the last 2 days. Took a COVID test and itw as negative. She as been using sudafed, mucinex, nyquil tablets without relief. Is chronically on fluticasone and zyrtec and reports compliance with those medications. Denies fevers, chills, body aches.  Reports some mild sore throat from post nasal drip, mild cough. Has some chronic  intermittent sob from asthma which is mild at this time. Denies chest pain.  The patient does have symptoms concerning for COVID-19 infection (fever, chills, cough, or new shortness of breath).   Past Medical, Surgical, Social History, Allergies, and Medications have been Reviewed.  Past Medical History:  Diagnosis Date  . Abnormal pap    w LEEP  . Alcoholism (HCC)   . Allergy   . Asthma   . Blood in stool   . Depression (emotion)   . Frequent headaches   . Hepatitis C, chronic (HCC)    hep c tx successful  . Irritable bowel syndrome   . OSA on CPAP - sees Dr. Jenne Campus in ENT 10/09/2013  . Seasonal allergies    scratch test in 2006///mold issue in house    Current Meds  Medication Sig  . dextromethorphan 15 MG/5ML syrup Take 10 mLs (30 mg total) by mouth 4 (four) times daily as needed for cough.  . doxycycline (VIBRAMYCIN) 100 MG capsule Take 1 capsule (100 mg total) by mouth 2 (two) times daily for 7 days.     Allergies:   Cashew nut oil, Other, Triptans, Lexapro [escitalopram oxalate], Cefaclor, and Nickel   Review of Systems  All other systems reviewed and are negative.  See HPI for history of present illness.  Physical Exam Vitals and nursing note reviewed.  Constitutional:      General: She is not in acute distress.    Appearance: She is well-developed.  HENT:     Head: Normocephalic and atraumatic.     Nose:     Comments: Nasal congestion Eyes:     Conjunctiva/sclera: Conjunctivae normal.  Pulmonary:     Effort: Pulmonary effort is normal.     Comments: Speaking in full sentences, no tachypneic or dyspnea Musculoskeletal:        General: Normal range of motion.     Cervical back: Neck supple.  Neurological:     Mental Status: She is alert and oriented to person, place, and time.  Psychiatric:        Mood and Affect: Mood normal.               There are no diagnoses linked to this encounter.   Time:   Today, I have spent 15 minutes with the patient  with telehealth technology discussing the above problems, reviewing the chart, previous notes, medications and orders.    Tests Ordered: No orders of the defined types were placed in this encounter.   Medication Changes: Meds ordered this encounter  Medications  . dextromethorphan 15 MG/5ML syrup    Sig: Take 10 mLs (30 mg total) by mouth 4 (four) times daily as needed for cough.    Dispense:  120 mL  Refill:  0    Order Specific Question:   Supervising Provider    Answer:   Hyacinth Meeker, BRIAN [[id number]]  . doxycycline (VIBRAMYCIN) 100 MG capsule    Sig: Take 1 capsule (100 mg total) by mouth 2 (two) times daily for 7 days.    Dispense:  14 capsule    Refill:  0    Order Specific Question:   Supervising Provider    Answer:   MILLER, BRIAN [[id number]]   MDM Pt c/o sxs of sinusitis. Discussed that sxs are likely viral at this time and that I would continue supportive care with her allergy medicine, fluticasone, cough medication, and saline nasal spray. I recommended that if sxs persist past 7 days I have written a prescription for an antibiotic that she can fill. She further reported neg covid testing. I advised repeat testing in 1-2 days to confirm. Advised close f/u and to seek care in person if sxs worsen.  Disposition:  Follow up  Signed, Karrie Meres, PA-C  07/26/2020 11:51 AM

## 2020-07-29 ENCOUNTER — Ambulatory Visit: Payer: No Typology Code available for payment source | Admitting: Obstetrics & Gynecology

## 2020-08-13 ENCOUNTER — Encounter: Payer: Self-pay | Admitting: Family Medicine

## 2020-08-13 ENCOUNTER — Other Ambulatory Visit: Payer: Self-pay

## 2020-08-13 ENCOUNTER — Ambulatory Visit: Payer: No Typology Code available for payment source | Admitting: Family Medicine

## 2020-08-13 VITALS — BP 90/64 | HR 90 | Temp 97.3°F | Ht 60.0 in | Wt 208.0 lb

## 2020-08-13 DIAGNOSIS — Z79899 Other long term (current) drug therapy: Secondary | ICD-10-CM | POA: Diagnosis not present

## 2020-08-13 DIAGNOSIS — G4733 Obstructive sleep apnea (adult) (pediatric): Secondary | ICD-10-CM

## 2020-08-13 DIAGNOSIS — J4530 Mild persistent asthma, uncomplicated: Secondary | ICD-10-CM | POA: Diagnosis not present

## 2020-08-13 DIAGNOSIS — Z0000 Encounter for general adult medical examination without abnormal findings: Secondary | ICD-10-CM | POA: Diagnosis not present

## 2020-08-13 DIAGNOSIS — F339 Major depressive disorder, recurrent, unspecified: Secondary | ICD-10-CM

## 2020-08-13 DIAGNOSIS — Z13220 Encounter for screening for lipoid disorders: Secondary | ICD-10-CM | POA: Diagnosis not present

## 2020-08-13 DIAGNOSIS — Z9989 Dependence on other enabling machines and devices: Secondary | ICD-10-CM

## 2020-08-13 DIAGNOSIS — G43909 Migraine, unspecified, not intractable, without status migrainosus: Secondary | ICD-10-CM

## 2020-08-13 LAB — CBC WITH DIFFERENTIAL/PLATELET
Basophils Absolute: 0.1 10*3/uL (ref 0.0–0.1)
Basophils Relative: 0.9 % (ref 0.0–3.0)
Eosinophils Absolute: 0.2 10*3/uL (ref 0.0–0.7)
Eosinophils Relative: 1.9 % (ref 0.0–5.0)
HCT: 43.7 % (ref 36.0–46.0)
Hemoglobin: 14.9 g/dL (ref 12.0–15.0)
Lymphocytes Relative: 30 % (ref 12.0–46.0)
Lymphs Abs: 2.5 10*3/uL (ref 0.7–4.0)
MCHC: 34.1 g/dL (ref 30.0–36.0)
MCV: 92.9 fl (ref 78.0–100.0)
Monocytes Absolute: 0.6 10*3/uL (ref 0.1–1.0)
Monocytes Relative: 7.1 % (ref 3.0–12.0)
Neutro Abs: 5 10*3/uL (ref 1.4–7.7)
Neutrophils Relative %: 60.1 % (ref 43.0–77.0)
Platelets: 310 10*3/uL (ref 150.0–400.0)
RBC: 4.7 Mil/uL (ref 3.87–5.11)
RDW: 13.2 % (ref 11.5–15.5)
WBC: 8.3 10*3/uL (ref 4.0–10.5)

## 2020-08-13 LAB — LIPID PANEL
Cholesterol: 180 mg/dL (ref 0–200)
HDL: 50.7 mg/dL (ref >39.00)
LDL Cholesterol: 95 mg/dL (ref 0–99)
NonHDL: 129.43
Total CHOL/HDL Ratio: 4
Triglycerides: 171 mg/dL — ABNORMAL HIGH (ref 0.0–149.0)
VLDL: 34.2 mg/dL (ref 0.0–40.0)

## 2020-08-13 LAB — COMPREHENSIVE METABOLIC PANEL WITH GFR
ALT: 28 U/L (ref 0–35)
AST: 16 U/L (ref 0–37)
Albumin: 4.2 g/dL (ref 3.5–5.2)
Alkaline Phosphatase: 52 U/L (ref 39–117)
BUN: 11 mg/dL (ref 6–23)
CO2: 25 meq/L (ref 19–32)
Calcium: 9.3 mg/dL (ref 8.4–10.5)
Chloride: 107 meq/L (ref 96–112)
Creatinine, Ser: 0.91 mg/dL (ref 0.40–1.20)
GFR: 77.65 mL/min
Glucose, Bld: 96 mg/dL (ref 70–99)
Potassium: 4.3 meq/L (ref 3.5–5.1)
Sodium: 140 meq/L (ref 135–145)
Total Bilirubin: 0.3 mg/dL (ref 0.2–1.2)
Total Protein: 6.7 g/dL (ref 6.0–8.3)

## 2020-08-13 NOTE — Addendum Note (Signed)
Addended by: Leonette Nutting on: 08/13/2020 09:33 AM   Modules accepted: Orders

## 2020-08-13 NOTE — Patient Instructions (Signed)
Why is Exercise Important? If I told you I had a single pill that would help you decrease stress by improving anxiety, decreasing depression, help you achieve a healthy weight, give you more energy, make you more productive, help you focus, decrease your risk of dementia/heart attack/stroke/falls, improve your bone health, and more would you be interested? These are just some of the benefits that exercise brings to you. IT IS WORTH carving out some time every day to fit in exercise. It will help in every aspect of your health. Even if you have injuries that prevent you from participating in a type of exercise you used to do; there is always something that you can do to keep exercise a part of your life. If improving your health is important, make exercise your priority. It is worth the time! If you have questions about the type of exercise that is right for you, please talk with me about this!     Exercising to Stay Healthy  Exercising regularly is important. It has many health benefits, such as:  Improving your overall fitness, flexibility, and endurance.  Increasing your bone density.  Helping with weight control.  Decreasing your body fat.  Increasing your muscle strength.  Reducing stress and tension.  Improving your overall health.   In order to become healthy and stay healthy, it is recommended that you do moderate-intensity and vigorous-intensity exercise. You can tell that you are exercising at a moderate intensity if you have a higher heart rate and faster breathing, but you are still able to hold a conversation. You can tell that you are exercising at a vigorous intensity if you are breathing much harder and faster and cannot hold a conversation while exercising. How often should I exercise? Choose an activity that you enjoy and set realistic goals. Your health care provider can help you to make an activity plan that works for you. Exercise regularly as directed by your health care  provider. This may include:  Doing resistance training twice each week, such as: ? Push-ups. ? Sit-ups. ? Lifting weights. ? Using resistance bands.  Doing a given intensity of exercise for a given amount of time. Choose from these options: ? 150 minutes of moderate-intensity exercise every week. ? 75 minutes of vigorous-intensity exercise every week. ? A mix of moderate-intensity and vigorous-intensity exercise every week.   Children, pregnant women, people who are out of shape, people who are overweight, and older adults may need to consult a health care provider for individual recommendations. If you have any sort of medical condition, be sure to consult your health care provider before starting a new exercise program. What are some exercise ideas? Some moderate-intensity exercise ideas include:  Walking at a rate of 1 mile in 15 minutes.  Biking.  Hiking.  Golfing.  Dancing.   Some vigorous-intensity exercise ideas include:  Walking at a rate of at least 4.5 miles per hour.  Jogging or running at a rate of 5 miles per hour.  Biking at a rate of at least 10 miles per hour.  Lap swimming.  Roller-skating or in-line skating.  Cross-country skiing.  Vigorous competitive sports, such as football, basketball, and soccer.  Jumping rope.  Aerobic dancing.   What are some everyday activities that can help me to get exercise?  Yard work, such as: ? Pushing a lawn mower. ? Raking and bagging leaves.  Washing and waxing your car.  Pushing a stroller.  Shoveling snow.  Gardening.  Washing windows or   floors. How can I be more active in my day-to-day activities?  Use the stairs instead of the elevator.  Take a walk during your lunch break.  If you drive, park your car farther away from work or school.  If you take public transportation, get off one stop early and walk the rest of the way.  Make all of your phone calls while standing up and walking  around.  Get up, stretch, and walk around every 30 minutes throughout the day. What guidelines should I follow while exercising?  Do not exercise so much that you hurt yourself, feel dizzy, or get very short of breath.  Consult your health care provider before starting a new exercise program.  Wear comfortable clothes and shoes with good support.  Drink plenty of water while you exercise to prevent dehydration or heat stroke. Body water is lost during exercise and must be replaced.  Work out until you breathe faster and your heart beats faster. This information is not intended to replace advice given to you by your health care provider. Make sure you discuss any questions you have with your health care provider.  

## 2020-08-13 NOTE — Progress Notes (Signed)
Nicole [name] DOB: 1975-08-19 Encounter date: 08/13/2020  This is a 45 y.o. female who presents for complete physical   History of present illness/Additional concerns: Last visit with me was 04/23/20.   States that she is doing well. Has some anxiety; but plans to address with psychiatrist. Dr. Evelene Croon started low dose ativan to use if needed. She is using sparingly; still taking the pristiq 100mg  regularly, abilify 15mg . She does get benefit with ativan. No suicidal ideation; just can't turn off anxiety at night; hard to sleep. Irrational anxiety and she knows this but can't shut it off.   Migraine/headache: these have been doing well. Amy lomax started to try to increase topamax dose, but 50mg  made her sleepy. She is still doing 25mg . Thinks she was getting stress migraines. stres was coming from issues with work - new but this is better. Hasn't had migraine in a month and a half.  Asthma: is breathing better; she is on the arnuity and feels that this has increased her anxiety however. Still taking singulair.  Depression: this has been well controlled; see above. Sleep apnea: uses cpap nightly; fits well. She likes this.   Follows with Dr. for gyn needs; she had to miss appointment with her this month because she had some URI sx.   Does have dermatologist - Dr. .    Past Medical History:  Diagnosis Date  . Abnormal pap    w LEEP  . Alcoholism (HCC)   . Allergy   . Asthma   . Blood in stool   . Depression (emotion)   . Frequent headaches   . Hepatitis C, chronic (HCC)    hep c tx successful  . Irritable bowel syndrome   . OSA on CPAP - sees Dr. in ENT 10/09/2013  . Seasonal allergies    scratch test in 2006///mold issue in house   Past Surgical History:  Procedure Laterality Date  . DILATION AND CURETTAGE OF UTERUS  1996, 2001, 2002   x 3  . LEEP  2008  . NASAL FRACTURE SURGERY  1999   repair  . NASAL SEPTOPLASTY W/ TURBINOPLASTY     Allergies   Allergen Reactions  . Cashew Nut Oil Anaphylaxis    Cashews and 1997 nuts  . Other Anaphylaxis    Nuts-[place] and cashews  . Triptans Anaphylaxis  . Lexapro [Escitalopram Oxalate]     manic  . Cefaclor Rash  . Nickel Rash   Current Meds  Medication Sig  . ARIPiprazole (ABILIFY) 15 MG tablet Take 15 mg by mouth daily.  . cetirizine (ZYRTEC) 10 MG tablet Take 10 mg by mouth daily.  2002 desvenlafaxine (PRISTIQ) 100 MG 24 hr tablet Take 100 mg by mouth daily.  2003 dextromethorphan 15 MG/5ML syrup Take 10 mLs (30 mg total) by mouth 4 (four) times daily as needed for cough.  . dicyclomine (BENTYL) 10 MG capsule Take 1 capsule (10 mg total) by mouth every 8 (eight) hours as needed.  . diphenoxylate-atropine (LOMOTIL) 2.5-0.025 MG tablet Take 1 tablet by mouth 3 (three) times daily as needed for constipation.  [place] doxepin (SINEQUAN) 10 MG capsule Take 10 mg by mouth at bedtime.   . fluticasone (FLONASE) 50 MCG/ACT nasal spray Place 2 sprays into both nostrils daily.  . Fluticasone Furoate (ARNUITY ELLIPTA) 100 MCG/ACT AEPB Inhale 1 puff into the lungs daily. Rinse mouth after each use.  . levalbuterol (XOPENEX HFA) 45 MCG/ACT inhaler Inhale 2 puffs into the lungs every 4 (  four) hours as needed for wheezing or shortness of breath.  . levonorgestrel (MIRENA) 20 MCG/24HR IUD 1 each by Intrauterine route once.  Marland Kitchen LORazepam (ATIVAN) 1 MG tablet Take 1 mg by mouth every 8 (eight) hours.  . montelukast (SINGULAIR) 10 MG tablet Take 1 tablet (10 mg total) by mouth at bedtime.  . Multiple Vitamin (MULTIVITAMINS PO) Take 1 tablet by mouth daily.   . ondansetron (ZOFRAN ODT) 8 MG disintegrating tablet Take 1 tablet (8 mg total) by mouth every 8 (eight) hours as needed for nausea or vomiting.  . Rimegepant Sulfate (NURTEC) 75 MG TBDP Take 75 mg by mouth daily as needed (take for abortive therapy of migraine, no more than 1 tablet in 24 hours or 10 per month).  . topiramate (TOPAMAX) 25 MG tablet Take 2  tablets (50 mg total) by mouth daily.   Social History   Tobacco Use  . Smoking status: Former Smoker    Packs/day: 1.50    Years: 15.00    Pack years: 22.50    Types: Cigarettes    Quit date: 05/15/2005    Years since quitting: 15.2  . Smokeless tobacco: Never Used  . Tobacco comment: quit in 2007, smoked for 13 years   Substance Use Topics  . Alcohol use: No    Alcohol/week: 0.0 standard drinks    Comment: quit drinking on 2004---relapse in 2007---QUIT 07/2005   Family History  Problem Relation Age of Onset  . Emphysema Mother   . Colon polyps Mother   . Diverticulitis Mother   . Heart disease Father   . Cancer Father        unsure origin; had spot on lung  . Hypertension Father   . Hepatitis C Father   . Alcohol abuse Father   . Drug abuse Father   . Stroke Maternal Grandmother   . Osteoporosis Maternal Grandmother   . Alcohol abuse Maternal Grandfather   . Liver disease Maternal Grandfather      Review of Systems  Constitutional: Negative for activity change, appetite change, chills, fatigue, fever and unexpected weight change.  HENT: Negative for congestion, ear pain, hearing loss, sinus pressure, sinus pain, sore throat and trouble swallowing.   Eyes: Negative for pain and visual disturbance.  Respiratory: Negative for cough, chest tightness, shortness of breath and wheezing.   Cardiovascular: Negative for chest pain, palpitations and leg swelling.  Gastrointestinal: Negative for abdominal pain, blood in stool, constipation, diarrhea, nausea and vomiting.       Bowels doing well  Genitourinary: Negative for difficulty urinating and menstrual problem.  Musculoskeletal: Negative for arthralgias and back pain.  Skin: Negative for rash.  Neurological: Positive for headaches. Negative for dizziness, weakness and numbness.  Hematological: Negative for adenopathy. Does not bruise/bleed easily.  Psychiatric/Behavioral: Negative for sleep disturbance and suicidal ideas.  The patient is not nervous/anxious.     CBC:  Lab Results  Component Value Date   WBC 13.2 (H) 04/12/2020   HGB 15.9 (H) 04/12/2020   HCT 46.0 04/12/2020   MCH 31.4 04/12/2020   MCHC 34.6 04/12/2020   RDW 12.5 04/12/2020   PLT 346 04/12/2020   MPV 10.0 11/01/2018   CMP: Lab Results  Component Value Date   NA 139 04/12/2020   K 3.6 04/12/2020   CL 105 04/12/2020   CO2 24 04/12/2020   ANIONGAP 10 04/12/2020   GLUCOSE 127 (H) 04/12/2020   BUN 12 04/12/2020   CREATININE 0.85 04/12/2020   CREATININE 0.96 11/01/2018  GFRAA >90 06/21/2011   CALCIUM 9.6 04/12/2020   PROT 7.4 04/12/2020   BILITOT 0.3 04/12/2020   ALKPHOS 52 04/12/2020   ALT 28 04/12/2020   AST 22 04/12/2020   LIPID: Lab Results  Component Value Date   CHOL 183 08/06/2019   TRIG 207.0 (H) 08/06/2019   HDL 47.30 08/06/2019   LDLCALC 96 06/19/2017    Objective:  BP 90/64   Pulse 90   Temp (!) 97.3 F (36.3 C) (Oral)   Ht 5' (1.524 m)   Wt 208 lb (94.3 kg)   SpO2 97%   BMI 40.62 kg/m   Weight: 208 lb (94.3 kg)   BP Readings from Last 3 Encounters:  08/13/20 90/64  06/17/20 101/61  06/14/20 124/84   Wt Readings from Last 3 Encounters:  08/13/20 208 lb (94.3 kg)  06/14/20 210 lb (95.3 kg)  04/23/20 210 lb 4.8 oz (95.4 kg)    Physical Exam Constitutional:      General: She is not in acute distress.    Appearance: She is well-developed.  HENT:     Head: Normocephalic and atraumatic.     Right Ear: External ear normal.     Left Ear: External ear normal.     Mouth/Throat:     Pharynx: No oropharyngeal exudate.  Eyes:     Conjunctiva/sclera: Conjunctivae normal.     Pupils: Pupils are equal, round, and reactive to light.  Neck:     Thyroid: No thyromegaly.  Cardiovascular:     Rate and Rhythm: Normal rate and regular rhythm.     Heart sounds: Normal heart sounds. No murmur heard. No friction rub. No gallop.   Pulmonary:     Effort: Pulmonary effort is normal.     Breath sounds:  Normal breath sounds.  Abdominal:     General: Bowel sounds are normal. There is no distension.     Palpations: Abdomen is soft. There is no mass.     Tenderness: There is no abdominal tenderness. There is no guarding.     Hernia: No hernia is present.  Musculoskeletal:        General: No tenderness or deformity. Normal range of motion.     Cervical back: Normal range of motion and neck supple.  Lymphadenopathy:     Cervical: No cervical adenopathy.  Skin:    General: Skin is warm and dry.     Findings: No rash.  Neurological:     Mental Status: She is alert and oriented to person, place, and time.     Deep Tendon Reflexes: Reflexes normal.     Reflex Scores:      Tricep reflexes are 2+ on the right side and 2+ on the left side.      Bicep reflexes are 2+ on the right side and 2+ on the left side.      Brachioradialis reflexes are 2+ on the right side and 2+ on the left side.      Patellar reflexes are 2+ on the right side and 2+ on the left side. Psychiatric:        Speech: Speech normal.        Behavior: Behavior normal.        Thought Content: Thought content normal.     Assessment/Plan: There are no preventive care reminders to display for this patient. Health Maintenance reviewed.  1. Preventative health care Work on restarting regular exercise.   2. OSA on CPAP - sees Dr. Jenne Campus in ENT She  is good about wearing cpap regularly.  3. Mild persistent asthma without complication Improved significantly with the arnuity. We discussed holding singulair as this could affect mood; but she is breathing well and well controlled with any allergies and would like to continue for now.   4. Episode of recurrent major depressive disorder, unspecified depression episode severity (HCC) Well controlled; follows with psychiatry regularly. She will call to set up follow up visit with her.   5. Migraine without status migrainosus, not intractable, unspecified migraine type Well  controlled on 25mg  daily topamax. Too much sedation with higher doses.   6. Lipid screening - Lipid panel; Future  7. High risk medication use - CBC with Differential/Platelet; Future - Comprehensive metabolic panel; Future   *send lab results to Dr. Evelene CroonKaur  Return in about 6 months (around 02/12/2021) for Chronic condition visit.  Theodis ShoveJunell Denys Labree, MD

## 2020-08-19 ENCOUNTER — Encounter: Payer: Self-pay | Admitting: Family Medicine

## 2020-08-23 ENCOUNTER — Ambulatory Visit: Payer: No Typology Code available for payment source | Admitting: Allergy

## 2020-08-23 ENCOUNTER — Encounter: Payer: Self-pay | Admitting: Allergy

## 2020-08-23 ENCOUNTER — Other Ambulatory Visit: Payer: Self-pay

## 2020-08-23 VITALS — BP 118/88 | HR 81 | Temp 97.8°F | Resp 18

## 2020-08-23 DIAGNOSIS — T781XXD Other adverse food reactions, not elsewhere classified, subsequent encounter: Secondary | ICD-10-CM | POA: Diagnosis not present

## 2020-08-23 DIAGNOSIS — J4530 Mild persistent asthma, uncomplicated: Secondary | ICD-10-CM

## 2020-08-23 DIAGNOSIS — J310 Chronic rhinitis: Secondary | ICD-10-CM

## 2020-08-23 MED ORDER — AZELASTINE HCL 0.1 % NA SOLN: 1.0000 | Freq: Two times a day (BID) | NASAL | 5 refills | Status: DC | PRN

## 2020-08-23 NOTE — Assessment & Plan Note (Signed)
Past history - Perennial rhinitis symptoms for 10+ years.  Tried Zyrtec, Flonase and Singulair with good benefit.  Allegra and Claritin not as effective.  Skin testing in 2006 showed multiple positives per patient report.  No prior AIT.  Sinus surgery in 2014. 2022 skin testing showed: Negative to indoor/outdoor allergens. Interim history - 2022 bloodwork negative to environmental allergy panel.   STOP Singulair.  Take zyrtec 10mg  daily - thinks it helps with PND.   Then about 1 month afterwards you may use over the counter antihistamines such as Zyrtec (cetirizine) daily ONLY if needed.   May use Flonase (fluticasone) nasal spray 1 spray per nostril twice a day as needed for nasal congestion.   May use azelastine nasal spray 1-2 sprays per nostril twice a day as needed for runny nose/drainage.  Nasal saline spray (i.e., Simply Saline) or nasal saline lavage (i.e., NeilMed) is recommended as needed and prior to medicated nasal sprays.  If not improving recommend ENT evaluation next.

## 2020-08-23 NOTE — Assessment & Plan Note (Signed)
Past history - Diagnosed with asthma over 20 years ago.  Recent ER visit due to shortness of breath.  No recent oral prednisone use.  Main triggers are strong emotions and cold weather.  Albuterol causes palpitations.  Advair caused thrush in the past.  Taking Singulair times few months with good benefit. 2022 spirometry showed normal pattern with 11% and greater than 200 cc improvement in FEV1 post bronchodilator treatment suggestive of reactive airway disease/asthma.  Clinically feeling improved. Interim history - doing better but concerned if Arnuity is making her anxiety worse.   Today's spirometry was normal.  ACT score 20. . Daily controller medication(s): continue Arnuity 1 puff daily and rinse mouth after each use - anxiety is not a common side effect of ICS inhalers.  Marland Kitchen STOP Singulair - sometimes this can alter mood.  . May use levoalbuterol rescue inhaler 2 puffs every 4 to 6 hours as needed for shortness of breath, chest tightness, coughing, and wheezing. May use levoalbuterol rescue inhaler 2 puffs 5 to 15 minutes prior to strenuous physical activities. Monitor frequency of use.  . Repeat spirometry at next visit.   Follow up with the psychiatrist as scheduled.   If anxiety not improved then will switch ICS inhaler to [place] which is a prodrug.

## 2020-08-23 NOTE — Patient Instructions (Addendum)
Rhinitis:   STOP Singulair.  Take zyrtec 10mg  daily.  Then about 1 month afterwards you may use over the counter antihistamines such as Zyrtec (cetirizine) daily ONLY if needed.   May use Flonase (fluticasone) nasal spray 1 spray per nostril twice a day as needed for nasal congestion.   May use azelastine nasal spray 1-2 sprays per nostril twice a day as needed for runny nose/drainage.   Nasal saline spray (i.e., Simply Saline) or nasal saline lavage (i.e., NeilMed) is recommended as needed and prior to medicated nasal sprays.  If not improving recommend ENT evaluation next.   Asthma: . Daily controller medication(s): continue Arnuity 1 puff daily and rinse mouth after each use.  STOP Singulair.  . May use levoalbuterol rescue inhaler 2 puffs every 4 to 6 hours as needed for shortness of breath, chest tightness, coughing, and wheezing. May use levoalbuterol rescue inhaler 2 puffs 5 to 15 minutes prior to strenuous physical activities. Monitor frequency of use.  . Asthma control goals:  o Full participation in all desired activities (may need albuterol before activity) o Albuterol use two times or less a week on average (not counting use with activity) o Cough interfering with sleep two times or less a month o Oral steroids no more than once a year o No hospitalizations  Food: Continue to avoid tree nuts. If interested we can schedule food challenge to tree nuts. You must be off antihistamines for 3-5 days before. Must be in good health and not ill. No vaccines/injections within the past 7 days. Not on any antibiotics. Plan on being in the office for 2-3 hours and must bring in the food you want to do the oral challenge for. You must call to schedule an appointment and specify it's for a food challenge.   For mild symptoms you can take over the counter antihistamines such as Benadryl and monitor symptoms closely. If symptoms worsen or if you have severe symptoms including  breathing issues, throat closure, significant swelling, whole body hives, severe diarrhea and vomiting, lightheadedness then seek immediate medical care.  Food action plan given in place.   Follow up in 3 months or sooner if needed to check on your breathing. Follow up with the psychiatrist as scheduled.

## 2020-08-23 NOTE — Progress Notes (Signed)
Follow Up Note  RE: Nicole [name] MRN: [id number] DOB: December 01, 1975 Date of Office Visit: 08/23/2020  Referring provider: Wynn Banker, MD Primary care provider: Wynn Banker, MD  Chief Complaint: Follow-up  History of Present Illness: I had the pleasure of seeing Nicole [name] for a follow up visit at the [facility] on 08/23/2020. She is a 45 y.o. female, who is being followed for asthma, non-allergic rhinitis and adverse food reaction. Her previous allergy office visit was on 06/14/2020 with Dr. Selena Batten. Today is a regular follow up visit.  Mild persistent asthma Currently on Arnuity 1 puff daily and doing well on it but concerned if it's making her anxiety worse. She is also on Singulair daily at night.  Using levoalbuterol on very rare occasions (about once a month) but had to use it the last 2 days once a day. Denies any ER/urgent care visits or prednisone use since the last visit.  Non-allergic rhinitis Takes Flonase 1 spray per nostril twice a day. No nosebleeds. Using zyrtec daily as well.  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 Not interested in food challenge for now.   Assessment and Plan: Nicole [name] is a 45 y.o. female with: Mild persistent asthma without complication Past history - Diagnosed with asthma over 20 years ago.  Recent ER visit due to shortness of breath.  No recent oral prednisone use.  Main triggers are strong emotions and cold weather.  Albuterol causes palpitations.  Advair caused thrush in the past.  Taking Singulair times few months with good benefit. 2022 spirometry showed normal pattern with 11% and greater than 200 cc improvement in FEV1 post bronchodilator treatment suggestive of reactive airway disease/asthma.  Clinically feeling improved. Interim history - doing better but concerned if Arnuity is making her anxiety worse.   Today's spirometry was normal.  ACT score 20. . Daily  controller medication(s): continue Arnuity 1 puff daily and rinse mouth after each use - anxiety is not a common side effect of ICS inhalers.  Marland Kitchen STOP Singulair - sometimes this can alter mood.  . May use levoalbuterol rescue inhaler 2 puffs every 4 to 6 hours as needed for shortness of breath, chest tightness, coughing, and wheezing. May use levoalbuterol rescue inhaler 2 puffs 5 to 15 minutes prior to strenuous physical activities. Monitor frequency of use.  . Repeat spirometry at next visit.   Follow up with the psychiatrist as scheduled.   If anxiety not improved then will switch ICS inhaler to [place] which is a prodrug.   Nonallergic rhinitis Past history - Perennial rhinitis symptoms for 10+ years.  Tried Zyrtec, Flonase and Singulair with good benefit.  Allegra and Claritin not as effective.  Skin testing in 2006 showed multiple positives per patient report.  No prior AIT.  Sinus surgery in 2014. 2022 skin testing showed: Negative to indoor/outdoor allergens. Interim history - 2022 bloodwork negative to environmental allergy panel.   STOP Singulair.  Take zyrtec 10mg  daily - thinks it helps with PND.   Then about 1 month afterwards you may use over the counter antihistamines such as Zyrtec (cetirizine) daily ONLY if needed.   May use Flonase (fluticasone) nasal spray 1 spray per nostril twice a day as needed for nasal congestion.   May use azelastine nasal spray 1-2 sprays per nostril twice a day as needed for runny nose/drainage.  Nasal saline spray (i.e., Simply Saline) or nasal saline lavage (i.e., NeilMed)  is recommended as needed and prior to medicated nasal sprays.  If not improving recommend ENT evaluation next.  Adverse reaction to food, subsequent encounter Past history - Cashews and [place] nuts caused throat tightness at the age of 47 requiring ER visit.  2006 skin testing was positive to tree nuts per patient report.  Tolerates peanuts. 2022 skin testing showed:  Negative to select tree nuts.  Interim history - 2022 bloodwork negative to nut panel. Patient not interested in re-introduction. Continue to avoid tree nuts. If interested we can schedule food challenge to tree nuts. You must be off antihistamines for 3-5 days before. Must be in good health and not ill. No vaccines/injections within the past 7 days. Not on any antibiotics. Plan on being in the office for 2-3 hours and must bring in the food you want to do the oral challenge for. You must call to schedule an appointment and specify it's for a food challenge.  For mild symptoms you can take over the counter antihistamines such as Benadryl and monitor symptoms closely. If symptoms worsen or if you have severe symptoms including breathing issues, throat closure, significant swelling, whole body hives, severe diarrhea and vomiting, lightheadedness then seek immediate medical care.  Food action plan given in place.  Declines epinephrine device as she never had to use one before.   Return in about 3 months (around 11/22/2020).  Meds ordered this encounter  Medications  . azelastine (ASTELIN) 0.1 % nasal spray    Sig: Place 1 spray into both nostrils 2 (two) times daily as needed (post nasal drip/runny nose).    Dispense:  30 mL    Refill:  5   Lab Orders  No laboratory test(s) ordered today    Diagnostics: Spirometry:  Tracings reviewed. Her effort: Good reproducible efforts. FVC: 3.52L FEV1: 2.68L, 103% predicted FEV1/FVC ratio: 76% Interpretation: Spirometry consistent with normal pattern.  Please see scanned spirometry results for details.  Medication List:  Current Outpatient Medications  Medication Sig Dispense Refill  . ARIPiprazole (ABILIFY) 15 MG tablet Take 15 mg by mouth daily.    Marland Kitchen azelastine (ASTELIN) 0.1 % nasal spray Place 1 spray into both nostrils 2 (two) times daily as needed (post nasal drip/runny nose). 30 mL 5  . cetirizine (ZYRTEC) 10 MG tablet Take 10 mg by mouth  daily.    Marland Kitchen desvenlafaxine (PRISTIQ) 100 MG 24 hr tablet Take 100 mg by mouth daily.    Marland Kitchen dicyclomine (BENTYL) 10 MG capsule Take 1 capsule (10 mg total) by mouth every 8 (eight) hours as needed. 90 capsule 1  . diphenoxylate-atropine (LOMOTIL) 2.5-0.025 MG tablet Take 1 tablet by mouth 3 (three) times daily as needed for constipation.    Marland Kitchen doxepin (SINEQUAN) 10 MG capsule Take 10 mg by mouth at bedtime.     . fluticasone (FLONASE) 50 MCG/ACT nasal spray Place 2 sprays into both nostrils daily. 16 g 6  . Fluticasone Furoate (ARNUITY ELLIPTA) 100 MCG/ACT AEPB Inhale 1 puff into the lungs daily. Rinse mouth after each use. 30 each 5  . levalbuterol (XOPENEX HFA) 45 MCG/ACT inhaler Inhale 2 puffs into the lungs every 4 (four) hours as needed for wheezing or shortness of breath. 1 each 2  . levonorgestrel (MIRENA) 20 MCG/24HR IUD 1 each by Intrauterine route once.    . Multiple Vitamin (MULTIVITAMINS PO) Take 1 tablet by mouth daily.     . ondansetron (ZOFRAN ODT) 8 MG disintegrating tablet Take 1 tablet (8 mg total) by mouth  every 8 (eight) hours as needed for nausea or vomiting. 30 tablet 3  . Rimegepant Sulfate (NURTEC) 75 MG TBDP Take 75 mg by mouth daily as needed (take for abortive therapy of migraine, no more than 1 tablet in 24 hours or 10 per month). 10 tablet 11  . topiramate (TOPAMAX) 25 MG tablet Take 2 tablets (50 mg total) by mouth daily. 180 tablet 3   No current facility-administered medications for this visit.   Allergies: Allergies  Allergen Reactions  . Cashew Nut Oil Anaphylaxis    Cashews and [place] nuts  . Other Anaphylaxis    Nuts-[place] and cashews  . Triptans Anaphylaxis  . Lexapro [Escitalopram Oxalate]     manic  . Cefaclor Rash  . Nickel Rash   I reviewed her past medical history, social history, family history, and environmental history and no significant changes have been reported from her previous visit.  Review of Systems  Constitutional: Negative for  appetite change, chills, fever and unexpected weight change.  HENT: Positive for congestion and rhinorrhea.   Eyes: Negative for itching.  Respiratory: Negative for cough, chest tightness, shortness of breath and wheezing.   Cardiovascular: Negative for chest pain.  Gastrointestinal: Negative for abdominal pain.  Genitourinary: Negative for difficulty urinating.  Skin: Negative for rash.  Allergic/Immunologic: Negative for environmental allergies.   Objective: BP 118/88 (BP Location: Left Arm, Patient Position: Sitting, Cuff Size: Normal)   Pulse 81   Temp 97.8 F (36.6 C) (Temporal)   Resp 18   SpO2 94%  There is no height or weight on file to calculate BMI. Physical Exam Vitals and nursing note reviewed.  Constitutional:      Appearance: Normal appearance. She is well-developed.  HENT:     Head: Normocephalic and atraumatic.     Right Ear: External ear normal.     Left Ear: External ear normal.     Nose: Nose normal.     Mouth/Throat:     Mouth: Mucous membranes are moist.     Pharynx: Oropharynx is clear.  Eyes:     Conjunctiva/sclera: Conjunctivae normal.  Cardiovascular:     Rate and Rhythm: Normal rate and regular rhythm.     Heart sounds: Normal heart sounds. No murmur heard. No friction rub. No gallop.   Pulmonary:     Effort: Pulmonary effort is normal.     Breath sounds: Normal breath sounds. No wheezing, rhonchi or rales.  Musculoskeletal:     Cervical back: Neck supple.  Skin:    General: Skin is warm.     Findings: No rash.  Neurological:     Mental Status: She is alert and oriented to person, place, and time.  Psychiatric:        Behavior: Behavior normal.    Previous notes and tests were reviewed. The plan was reviewed with the patient/family, and all questions/concerned were addressed.  It was my pleasure to see Nicole [name] today and participate in her care. Please feel free to contact me with any questions or concerns.  Sincerely,  Wyline Mood,  DO Allergy & Immunology  [facility] office: [phone] [facility] office: [phone]

## 2020-08-23 NOTE — Assessment & Plan Note (Signed)
Past history - Cashews and [place] nuts caused throat tightness at the age of 89 requiring ER visit.  2006 skin testing was positive to tree nuts per patient report.  Tolerates peanuts. 2022 skin testing showed: Negative to select tree nuts.  Interim history - 2022 bloodwork negative to nut panel. Patient not interested in re-introduction. Continue to avoid tree nuts. If interested we can schedule food challenge to tree nuts. You must be off antihistamines for 3-5 days before. Must be in good health and not ill. No vaccines/injections within the past 7 days. Not on any antibiotics. Plan on being in the office for 2-3 hours and must bring in the food you want to do the oral challenge for. You must call to schedule an appointment and specify it's for a food challenge.  For mild symptoms you can take over the counter antihistamines such as Benadryl and monitor symptoms closely. If symptoms worsen or if you have severe symptoms including breathing issues, throat closure, significant swelling, whole body hives, severe diarrhea and vomiting, lightheadedness then seek immediate medical care.  Food action plan given in place.  Declines epinephrine device as she never had to use one before.

## 2020-09-16 ENCOUNTER — Encounter: Payer: Self-pay | Admitting: Family Medicine

## 2020-09-21 ENCOUNTER — Telehealth: Payer: Self-pay | Admitting: Family Medicine

## 2020-09-21 ENCOUNTER — Other Ambulatory Visit: Payer: Self-pay | Admitting: Family Medicine

## 2020-09-21 DIAGNOSIS — F339 Major depressive disorder, recurrent, unspecified: Secondary | ICD-10-CM

## 2020-09-27 ENCOUNTER — Ambulatory Visit: Payer: No Typology Code available for payment source | Admitting: Obstetrics & Gynecology

## 2020-09-27 NOTE — Telephone Encounter (Signed)
Patient called back and stated that she contacted the number provider and Dr. Jennelle Human is not taking any new patients at this time. Pt wanted to see if a provider be recommended to another psychiatrist, please advise. CB is [phone]

## 2020-09-30 NOTE — Telephone Encounter (Signed)
Called and left msg on VM for pt to call our office back. I need to inform the patient that I do not know of any other psychiatrist offices accepting new patients. All psychiatrist's office requires  that the patient call themself to make their  own appt

## 2020-10-08 ENCOUNTER — Other Ambulatory Visit: Payer: Self-pay

## 2020-10-08 ENCOUNTER — Encounter: Payer: Self-pay | Admitting: Obstetrics & Gynecology

## 2020-10-08 ENCOUNTER — Other Ambulatory Visit (HOSPITAL_COMMUNITY)
Admission: RE | Admit: 2020-10-08 | Discharge: 2020-10-08 | Disposition: A | Discharge status: Home / Self Care | Payer: No Typology Code available for payment source | Source: Ambulatory Visit | Attending: Obstetrics & Gynecology | Admitting: Obstetrics & Gynecology

## 2020-10-08 ENCOUNTER — Ambulatory Visit (INDEPENDENT_AMBULATORY_CARE_PROVIDER_SITE_OTHER): Payer: No Typology Code available for payment source | Admitting: Obstetrics & Gynecology

## 2020-10-08 VITALS — BP 120/80 | Ht 60.0 in | Wt 209.0 lb

## 2020-10-08 DIAGNOSIS — Z30431 Encounter for routine checking of intrauterine contraceptive device: Secondary | ICD-10-CM | POA: Diagnosis not present

## 2020-10-08 DIAGNOSIS — Z6841 Body Mass Index (BMI) 40.0 and over, adult: Secondary | ICD-10-CM | POA: Diagnosis not present

## 2020-10-08 DIAGNOSIS — Z01419 Encounter for gynecological examination (general) (routine) without abnormal findings: Secondary | ICD-10-CM

## 2020-10-08 NOTE — Progress Notes (Signed)
Nicole [name] November 08, 1975 [id number]   History:    45 y.o. G26P0A1  Married  RP:  Established patient presenting for annual gyn exam   HPI: Well on Mirena IUD x 06/2019.  Very light menses.  No pelvic pain.  No pain with IC.  Pap Neg in 2018.  Breasts normal.  Screening mammo neg 10/2019. BMI 40.82.  Good fitness.  Health labs with Fam MD.    Past medical history,surgical history, family history and social history were all reviewed and documented in the EPIC chart.  Gynecologic History No LMP recorded. (Menstrual status: IUD).  Obstetric History OB History  Gravida Para Term Preterm AB Living  4       4 0  SAB IAB Ectopic Multiple Live Births  1 3          # Outcome Date GA Lbr Len/2nd Weight Sex Delivery Anes PTL Lv  4 SAB           3 IAB           2 IAB           1 IAB              ROS: A ROS was performed and pertinent positives and negatives are included in the history.  GENERAL: No fevers or chills. HEENT: No change in vision, no earache, sore throat or sinus congestion. NECK: No pain or stiffness. CARDIOVASCULAR: No chest pain or pressure. No palpitations. PULMONARY: No shortness of breath, cough or wheeze. GASTROINTESTINAL: No abdominal pain, nausea, vomiting or diarrhea, melena or bright red blood per rectum. GENITOURINARY: No urinary frequency, urgency, hesitancy or dysuria. MUSCULOSKELETAL: No joint or muscle pain, no back pain, no recent trauma. DERMATOLOGIC: No rash, no itching, no lesions. ENDOCRINE: No polyuria, polydipsia, no heat or cold intolerance. No recent change in weight. HEMATOLOGICAL: No anemia or easy bruising or bleeding. NEUROLOGIC: No headache, seizures, numbness, tingling or weakness. PSYCHIATRIC: No depression, no loss of interest in normal activity or change in sleep pattern.     Exam:   BP 120/80 (BP Location: Right Arm, Patient Position: Sitting, Cuff Size: Normal)   Ht 5' (1.524 m)   Wt 209 lb (94.8 kg)   BMI 40.82 kg/m   Body mass index is  40.82 kg/m.  General appearance : Well developed well nourished female. No acute distress HEENT: Eyes: no retinal hemorrhage or exudates,  Neck supple, trachea midline, no carotid bruits, no thyroidmegaly Lungs: Clear to auscultation, no rhonchi or wheezes, or rib retractions  Heart: Regular rate and rhythm, no murmurs or gallops Breast:Examined in sitting and supine position were symmetrical in appearance, no palpable masses or tenderness,  no skin retraction, no nipple inversion, no nipple discharge, no skin discoloration, no axillary or supraclavicular lymphadenopathy Abdomen: no palpable masses or tenderness, no rebound or guarding Extremities: no edema or skin discoloration or tenderness  Pelvic: Vulva: Normal             Vagina: No gross lesions or discharge  Cervix: No gross lesions or discharge.  IUD strings visible at exocervix.  Pap reflex done.  Uterus  AV, normal size, shape and consistency, non-tender and mobile  Adnexa  Without masses or tenderness  Anus: Normal   Assessment/Plan:  45 y.o. female for annual exam   1. Encounter for routine gynecological examination with Papanicolaou smear of cervix Normal gynecologic exam.  Pap reflex done.  Breast exam normal.  Screening mammogram June 2021 was negative.  Health labs with family physician. - Cytology - PAP( [place])  2. IUD check up Mirena IUD since February 2021.  Well-tolerated with no complication.  IUD in good location.  3. Class 3 severe obesity due to excess calories without serious comorbidity with body mass index (BMI) of 40.0 to 44.9 in adult [facility]) Severe obesity.  Recommend a lower calorie/carb diet.  Intermittent fasting recommended.  Aerobic activities 5 times a week and light weightlifting every 2 days.  Genia Del MD, 2:52 PM 10/08/2020

## 2020-10-11 ENCOUNTER — Encounter: Payer: Self-pay | Admitting: Obstetrics & Gynecology

## 2020-10-12 ENCOUNTER — Encounter (HOSPITAL_COMMUNITY): Payer: Self-pay | Admitting: *Deleted

## 2020-10-12 ENCOUNTER — Emergency Department (HOSPITAL_COMMUNITY)
Admission: EM | Admit: 2020-10-12 | Discharge: 2020-10-12 | Disposition: A | Discharge status: Home / Self Care | Payer: No Typology Code available for payment source | Attending: Emergency Medicine | Admitting: Emergency Medicine

## 2020-10-12 ENCOUNTER — Other Ambulatory Visit: Payer: Self-pay

## 2020-10-12 DIAGNOSIS — J4530 Mild persistent asthma, uncomplicated: Secondary | ICD-10-CM | POA: Diagnosis not present

## 2020-10-12 DIAGNOSIS — R002 Palpitations: Secondary | ICD-10-CM | POA: Diagnosis not present

## 2020-10-12 DIAGNOSIS — R197 Diarrhea, unspecified: Secondary | ICD-10-CM | POA: Diagnosis not present

## 2020-10-12 DIAGNOSIS — Z87891 Personal history of nicotine dependence: Secondary | ICD-10-CM | POA: Insufficient documentation

## 2020-10-12 DIAGNOSIS — Z7952 Long term (current) use of systemic steroids: Secondary | ICD-10-CM | POA: Insufficient documentation

## 2020-10-12 DIAGNOSIS — R0602 Shortness of breath: Secondary | ICD-10-CM | POA: Diagnosis not present

## 2020-10-12 DIAGNOSIS — F419 Anxiety disorder, unspecified: Secondary | ICD-10-CM | POA: Insufficient documentation

## 2020-10-12 DIAGNOSIS — R112 Nausea with vomiting, unspecified: Secondary | ICD-10-CM | POA: Diagnosis not present

## 2020-10-12 LAB — CBC WITH DIFFERENTIAL/PLATELET
Abs Immature Granulocytes: 0.03 10*3/uL (ref 0.00–0.07)
Basophils Absolute: 0.1 10*3/uL (ref 0.0–0.1)
Basophils Relative: 1 %
Eosinophils Absolute: 0.1 10*3/uL (ref 0.0–0.5)
Eosinophils Relative: 1 %
HCT: 46.1 % — ABNORMAL HIGH (ref 36.0–46.0)
Hemoglobin: 16.1 g/dL — ABNORMAL HIGH (ref 12.0–15.0)
Immature Granulocytes: 0 %
Lymphocytes Relative: 36 %
Lymphs Abs: 3.2 10*3/uL (ref 0.7–4.0)
MCH: 32 pg (ref 26.0–34.0)
MCHC: 34.9 g/dL (ref 30.0–36.0)
MCV: 91.7 fL (ref 80.0–100.0)
Monocytes Absolute: 0.7 10*3/uL (ref 0.1–1.0)
Monocytes Relative: 7 %
Neutro Abs: 4.9 10*3/uL (ref 1.7–7.7)
Neutrophils Relative %: 55 %
Platelets: 325 10*3/uL (ref 150–400)
RBC: 5.03 MIL/uL (ref 3.87–5.11)
RDW: 12.1 % (ref 11.5–15.5)
WBC: 8.9 10*3/uL (ref 4.0–10.5)
nRBC: 0 % (ref 0.0–0.2)

## 2020-10-12 LAB — BASIC METABOLIC PANEL
Anion gap: 6 (ref 5–15)
BUN: 11 mg/dL (ref 6–20)
CO2: 24 mmol/L (ref 22–32)
Calcium: 9.3 mg/dL (ref 8.9–10.3)
Chloride: 110 mmol/L (ref 98–111)
Creatinine, Ser: 1.04 mg/dL — ABNORMAL HIGH (ref 0.44–1.00)
GFR, Estimated: >60 mL/min (ref >60)
Glucose, Bld: 105 mg/dL — ABNORMAL HIGH (ref 70–99)
Potassium: 3.9 mmol/L (ref 3.5–5.1)
Sodium: 140 mmol/L (ref 135–145)

## 2020-10-12 NOTE — ED Triage Notes (Signed)
Pt has been gradually decreasing desvenlafaxine and starting fluoxetine over the past 4 days. She reports panic attacks, heart palpitations, feeling like she is being smothered. She calls nurse hotline and was told she may be experiencing serotonin syndrome and to go to ED. No SI/HI or hallucinations.

## 2020-10-12 NOTE — Discharge Instructions (Signed)
Today it does not appear that you are in serotonin syndrome.    Please try not to take zofran as this also can manipulate serotonin.   Please follow up with your care team.    I have given you information on serotonin syndrome for you to review so you know what to watch for.    If you develop any new or concerning symptoms, have constant chest pain, shortness of breath, or have other concerns please seek additional medical care and evaluation.

## 2020-10-12 NOTE — ED Provider Notes (Signed)
Emergency Medicine Provider Triage Evaluation Note  Nicole [name] , a 45 y.o. female  was evaluated in triage.  Pt complains of palpitations and feeling anxious since yesterday.  She started taking Prozac and Pristiq recently and is unsure if this caused her symptoms.  Denies any chest pain or shortness of breath.  Review of Systems  Positive: Palpitations Negative: Chest pain  Physical Exam  BP 124/86 (BP Location: Left Arm)   Pulse (!) 101   Temp 98.6 F (37 C) (Oral)   Resp 16   Ht 5' (1.524 m)   Wt 94.3 kg   SpO2 98%   BMI 40.62 kg/m  Gen:   Awake, no distress   Resp:  Normal effort  MSK:   Moves extremities without difficulty  Other:  Appears anxious  Medical Decision Making  Medically screening exam initiated at 2:20 PM.  Appropriate orders placed.  Nicole [name] was informed that the remainder of the evaluation will be completed by another provider, this initial triage assessment does not replace that evaluation, and the importance of remaining in the ED until their evaluation is complete.  Will obtain lab work   Dietrich Pates, PA-C 10/12/20 1420    Gerhard Munch, MD 10/12/20 [phone]

## 2020-10-12 NOTE — ED Provider Notes (Signed)
[facility]-EMERGENCY DEPT Provider Note   CSN: [id number] Arrival date & time: 10/12/20  1339     History Chief Complaint  Patient presents with  . Panic Attack  . Palpitations         Nicole [name] is a 45 y.o. female with past medical history of IBS, anxiety, depression, who presents today for evaluation of what she describes as panic attacks. She, 4 days ago began weaning off Pristiq, and had dropped from 50 mg a day down to 25.  At the same time she has been starting Prozac at 10 mg.  After 5 days the plan is for her to be completely off Pristiq and up the Prozac to 20 mg. She states that yesterday she had nausea, vomiting, and diarrhea for which she took a Zofran. She states that the diarrhea itself is not concerning for as she has IBS.  She states that she does not frequently get panic attacks, is only had 1 before and that was while taking the Pristiq.  She reports that she has had 2 panic attacks in the past day.  During these episodes she feels like her chest is tight, she feels anxious, jittery, short of breath and states that overall she feels like "I am losing my shit."  She reports that she had a fever over 100.4 shortly prior to arrival.  Currently she doesn't feel any chest pain or short of breath.  No OCPs or estrogen containing hormones.  No history of DVT/PE.  No hemoptysis.  She denies any recent sick contacts.  She denies any recent surgeries or immobilizations.  No leg swelling.  She states that she called the office where her psychiatric provider works, and talked with the pharmacy social history was concerned that she may have serotonin syndrome and recommended that she come to the emergency room. Patient denies any twitching or jerking movements.   HPI     Past Medical History:  Diagnosis Date  . Abnormal pap    w LEEP  . Alcoholism (HCC)   . Allergy   . Asthma   . Blood in stool   . Depression (emotion)   . Frequent headaches   .  Hepatitis C, chronic (HCC)    hep c tx successful  . Irritable bowel syndrome   . OSA on CPAP - sees Dr. Jenne Campus in ENT 10/09/2013  . Seasonal allergies    scratch test in 2006///mold issue in house    Patient Active Problem List   Diagnosis Date Noted  . Nonallergic rhinitis 08/23/2020  . Mild persistent asthma without complication 06/14/2020  . Other allergic rhinitis 06/14/2020  . Adverse reaction to food, subsequent encounter 06/14/2020  . IBS (irritable bowel syndrome) 05/24/2016  . Chronic venous insufficiency 05/24/2016  . Migraine 05/24/2016  . Pulmonary nodules 10/27/2014  . Hepatitis C, chronic (HCC)   . Depression 10/09/2013  . OSA on CPAP - sees Dr. Jenne Campus in ENT 10/09/2013  . Allergic rhinitis 05/30/2012  . Cough variant asthma 02/08/2012    Past Surgical History:  Procedure Laterality Date  . DILATION AND CURETTAGE OF UTERUS  1996, 2001, 2002   x 3  . LEEP  2008  . NASAL FRACTURE SURGERY  1999   repair  . NASAL SEPTOPLASTY W/ TURBINOPLASTY       OB History    Gravida  4   Para      Term      Preterm      AB  4   Living  0     SAB  1   IAB  3   Ectopic      Multiple      Live Births              Family History  Problem Relation Age of Onset  . Emphysema Mother   . Colon polyps Mother   . Diverticulitis Mother   . Heart disease Father   . Cancer Father        unsure origin; had spot on lung  . Hypertension Father   . Hepatitis C Father   . Alcohol abuse Father   . Drug abuse Father   . Stroke Maternal Grandmother   . Osteoporosis Maternal Grandmother   . Alcohol abuse Maternal Grandfather   . Liver disease Maternal Grandfather     Social History   Tobacco Use  . Smoking status: Former Smoker    Packs/day: 1.50    Years: 15.00    Pack years: 22.50    Types: Cigarettes    Quit date: 05/15/2005    Years since quitting: 15.4  . Smokeless tobacco: Never Used  . Tobacco comment: quit in 2007, smoked for 13 years    Vaping Use  . Vaping Use: Never used  Substance Use Topics  . Alcohol use: No    Alcohol/week: 0.0 standard drinks    Comment: quit drinking on 2004---relapse in 2007---QUIT 07/2005  . Drug use: No    Comment: IV drug use///street drugs///inhalants  QUIT 07/2005    Home Medications Prior to Admission medications   Medication Sig Start Date End Date Taking? Authorizing Provider  ARIPiprazole (ABILIFY) 15 MG tablet Take 15 mg by mouth daily.    [provider]  azelastine (ASTELIN) 0.1 % nasal spray Place 1 spray into both nostrils 2 (two) times daily as needed (post nasal drip/runny nose). 08/23/20   Ellamae Sia, DO  cetirizine (ZYRTEC) 10 MG tablet Take 10 mg by mouth daily.    [provider]  desvenlafaxine (PRISTIQ) 100 MG 24 hr tablet Take 100 mg by mouth daily.    [provider]  dicyclomine (BENTYL) 10 MG capsule Take 1 capsule (10 mg total) by mouth every 8 (eight) hours as needed. 05/28/17   Armbruster, Willaim Rayas, MD  diphenoxylate-atropine (LOMOTIL) 2.5-0.025 MG tablet Take 1 tablet by mouth 3 (three) times daily as needed for constipation. 06/20/18   [provider]  doxepin (SINEQUAN) 10 MG capsule Take 10 mg by mouth at bedtime.     [provider]  fluticasone (FLONASE) 50 MCG/ACT nasal spray Place 2 sprays into both nostrils daily. 04/23/20   Koberlein, Paris Lore, MD  Fluticasone Furoate (ARNUITY ELLIPTA) 100 MCG/ACT AEPB Inhale 1 puff into the lungs daily. Rinse mouth after each use. 06/14/20   Ellamae Sia, DO  levalbuterol [facility] HFA) 45 MCG/ACT inhaler Inhale 2 puffs into the lungs every 4 (four) hours as needed for wheezing or shortness of breath. 06/14/20   Ellamae Sia, DO  levonorgestrel (MIRENA) 20 MCG/24HR IUD 1 each by Intrauterine route once.    [provider]  Multiple Vitamin (MULTIVITAMINS PO) Take 1 tablet by mouth daily.     [provider]  ondansetron (ZOFRAN ODT) 8 MG disintegrating tablet Take 1  tablet (8 mg total) by mouth every 8 (eight) hours as needed for nausea or vomiting. 05/28/17   Benancio Deeds, MD  Rimegepant Sulfate (NURTEC) 75 MG TBDP Take 75  mg by mouth daily as needed (take for abortive therapy of migraine, no more than 1 tablet in 24 hours or 10 per month). 07/13/20   Lomax, Amy, NP  topiramate (TOPAMAX) 25 MG tablet Take 2 tablets (50 mg total) by mouth daily. 07/13/20   Lomax, Amy, NP    Allergies    Cashew nut oil, Other, Triptans, Lexapro [escitalopram oxalate], Cefaclor, and Nickel  Review of Systems   Review of Systems  Constitutional: Positive for fatigue and fever. Negative for chills.  Respiratory: Positive for shortness of breath (only during what se describes as panic attacks. ).   Cardiovascular: Positive for chest pain (Only during what she describes as panic attacks. ).  Gastrointestinal: Positive for diarrhea, nausea and vomiting. Negative for abdominal pain.  Musculoskeletal: Negative for back pain and neck pain.  Skin: Negative for color change, rash and wound.  Psychiatric/Behavioral: Negative for decreased concentration, hallucinations and suicidal ideas. The patient is nervous/anxious.   All other systems reviewed and are negative.   Physical Exam Updated Vital Signs BP (!) 127/100 (BP Location: Left Arm)   Pulse 80   Temp 98.6 F (37 C) (Oral)   Resp 18   Ht 5' (1.524 m)   Wt 94.3 kg   SpO2 96%   BMI 40.62 kg/m   Physical Exam Vitals and nursing note reviewed.  Constitutional:      General: She is not in acute distress.    Appearance: She is not ill-appearing or diaphoretic.  HENT:     Head: Normocephalic and atraumatic.     Mouth/Throat:     Mouth: Mucous membranes are moist.  Eyes:     General: No scleral icterus.       Right eye: No discharge.        Left eye: No discharge.     Conjunctiva/sclera: Conjunctivae normal.  Cardiovascular:     Rate and Rhythm: Normal rate and regular rhythm.     Pulses: Normal pulses.      Heart sounds: Normal heart sounds. No murmur heard.   Pulmonary:     Effort: Pulmonary effort is normal. No respiratory distress.     Breath sounds: Normal breath sounds. No stridor.  Abdominal:     General: There is no distension.     Tenderness: There is no abdominal tenderness. There is no guarding.  Musculoskeletal:        General: No deformity.     Cervical back: Normal range of motion and neck supple.     Right lower leg: No edema.     Left lower leg: No edema.  Skin:    General: Skin is warm and dry.  Neurological:     General: No focal deficit present.     Mental Status: She is alert.     Motor: No abnormal muscle tone.     Comments: Patient is awake and alert and answers questions appropriately.  No clonus in bilateral arms and legs.   Psychiatric:        Mood and Affect: Mood normal.        Behavior: Behavior normal.     ED Results / Procedures / Treatments   Labs (all labs ordered are listed, but only abnormal results are displayed) Labs Reviewed  BASIC METABOLIC PANEL - Abnormal; Notable for the following components:      Result Value   Glucose, Bld 105 (*)    Creatinine, Ser 1.04 (*)    All other components within normal limits  CBC WITH DIFFERENTIAL/PLATELET - Abnormal; Notable for the following components:   Hemoglobin 16.1 (*)    HCT 46.1 (*)    All other components within normal limits    EKG EKG Interpretation  Date/Time:  Tuesday Oct 12 2020 20:35:25 EDT Ventricular Rate:  73 PR Interval:  122 QRS Duration: 118 QT Interval:  390 QTC Calculation: 430 R Axis:   43 Text Interpretation: Sinus rhythm Nonspecific intraventricular conduction delay Low voltage, precordial leads 12 Lead; Mason-Likar NSR, no STEMI Confirmed by Coralee PesaHorton, Kristie [phone]([id number]) on 10/12/2020 8:51:26 PM   Radiology No results found.  Procedures Procedures   Medications Ordered in ED Medications - No data to display  ED Course  I have reviewed the triage vital signs and the  nursing notes.  Pertinent labs & imaging results that were available during my care of the patient were reviewed by me and considered in my medical decision making (see chart for details).    MDM Rules/Calculators/A&P                         Is a 45 year old woman who presents today for concern of panic attacks. She is tapering off Pristiq and onto Prozac.  She states that she has had a panic attack, when she called her doctor's office she says she spoke with someone in the pharmacy and they recommended that she come to the emergency room for evaluation of serotonin syndrome. She had also been taking Zofran at points to help with nausea while making this switch. Here she is afebrile, not tachycardic or tachypneic.  She is awake and alert.  She does not have any clonus or evidence of neuromuscular hyperreactivity.  While she does report shortness of breath and chest pains when she is having what she describes as a panic attack at the time of my evaluation she is calm, denies chest pain, cough, or shortness of breath.  CBC is unremarkable.  BMP is without significant derangements. We did discuss options for troponin and chest x-ray, however patient does not feel that her symptoms are cardiac in nature, believes they are panic attacks and does not wish for further work-up at this time.  This patient was seen as a shared visit with Dr. Wilkie AyeHorton.   Clinically patient does not appear to be having serotonin syndrome.    Return precautions were discussed with patient who states their understanding.  At the time of discharge patient denied any unaddressed complaints or concerns.  Patient is agreeable for discharge home.  Note: Portions of this report may have been transcribed using voice recognition software. Every effort was made to ensure accuracy; however, inadvertent computerized transcription errors may be present  Final Clinical Impression(s) / ED Diagnoses Final diagnoses:  Anxiety    Rx / DC  Orders ED Discharge Orders    None       Cristina GongHammond, Uzziah Rigg W, PA-C 10/13/20 0043    Rozelle LoganHorton, Kristie M, DO 10/13/20 1507

## 2020-10-13 LAB — CYTOLOGY - PAP: Diagnosis: NEGATIVE

## 2020-10-15 ENCOUNTER — Telehealth: Payer: Self-pay | Admitting: *Deleted

## 2020-10-15 MED ORDER — ALVESCO 160 MCG/ACT IN AERS: 1.0000 | INHALATION_SPRAY | Freq: Two times a day (BID) | RESPIRATORY_TRACT | 5 refills | Status: DC

## 2020-10-15 NOTE — Telephone Encounter (Signed)
Spoke with patient, informed her of Dr. Elmyra Ricks recommendation. I informed the patient that we will have two samples with coupon card up front in our [place] office ready for pick up on Monday 10/18/2020. Patient verbalized understanding. I called Ashleigh in [place] and asked her to place the samples up front.

## 2020-10-15 NOTE — Telephone Encounter (Signed)
Please call patient back.  Will switch to Alvesco 1 puff twice a day and rinse mouth after each use.  Please set out 2 samples of Alvesco and coupon for patient to pick up. Sent in Rx - this is a mail order. Let us know if still too expensive.  Thank you.

## 2020-10-15 NOTE — Telephone Encounter (Signed)
Inhalers and coupon have been placed up front for the patient in the [place] office.

## 2020-10-15 NOTE — Telephone Encounter (Signed)
Patient called office and needs alternative to Arnuity 100 mcg.  Patient lost her job recently and has had to change to [facility]. She can't afford the cost of Arnuity and insurance when she is not working.  Patient has enough Arnuity to last until the end of the month.  Per patient, Advair Diskus 500/50 is Tier 2 and Flovent is Tier 3.   Informed we do not have any samples of Arnuity at this time.  Will check with Dr. Selena Batten regarding changing to another inhaler that will be cheaper for patient while she is out of work or to an inhaler that we can get samples of regularly.  [facility] [employer].

## 2020-10-16 ENCOUNTER — Other Ambulatory Visit: Payer: Self-pay | Admitting: Family Medicine

## 2020-11-22 ENCOUNTER — Ambulatory Visit: Payer: No Typology Code available for payment source | Admitting: Allergy

## 2020-11-23 ENCOUNTER — Telehealth: Payer: No Typology Code available for payment source | Admitting: Family Medicine

## 2020-12-27 ENCOUNTER — Telehealth: Payer: Self-pay | Admitting: Neurology

## 2020-12-27 NOTE — Telephone Encounter (Signed)
Request Reference Number: [id number]. NURTEC TAB 75MG  ODT is approved through 12/27/2021. Your patient may now fill this prescription and it will be covered.

## 2020-12-27 NOTE — Telephone Encounter (Signed)
PA completed through CMM/[employer]um KEY: [id number] Will wait for response

## 2021-01-04 ENCOUNTER — Telehealth: Payer: Self-pay

## 2021-01-04 NOTE — Telephone Encounter (Signed)
Spoke with patient, informed her that prescription was sent in to the mail ordered pharmacy [facility]. I informed her they should have reached out, she informed me they did however she didn't get back to them. Patient was given the number to the pharmacy to call and set up delivery. Patient verbalized understanding.

## 2021-01-04 NOTE — Telephone Encounter (Signed)
Patient called requesting to move forward with the Alvesco prescription.  [facility] [address]

## 2021-02-15 ENCOUNTER — Other Ambulatory Visit: Payer: Self-pay | Admitting: Family Medicine

## 2021-03-21 ENCOUNTER — Telehealth: Payer: Self-pay | Admitting: Family Medicine

## 2021-03-21 NOTE — Telephone Encounter (Signed)
Patient calling in with respiratory symptoms: Shortness of breath, chest pain, palpitations or other red words send to Triage  Does the patient have a fever over 100, cough, congestion, sore throat, runny nose, lost of taste/smell within the last 5 days?    Yes: "have you tested for Covid?" []   If yes, was it positive []  OR negative [] ? If positive in the last 5 days, please schedule virtual visit now. If negative, schedule for an in person OV with the next available provider if PCP has no openings. Please also let patient know they will be tested again (follow the script below)  No, patient has not tested for covid: "Do you have access to test from home?" If yes []  please ask them to test from home & notify of the results to determine if VV or in person visit. If no [x]  schedule patient for an in person visit. Let them know "you will have to arrive prior to your appt time to be Covid tested. Please park in back of office at the cone & call [phone] to let the staff know you have arrived. A staff member will meet you at your car to do a rapid covid test. Once the test has resulted you will be notified by phone of your results to determine if appt will remain an in person visit or be converted to a virtual/phone visit."    THINGS TO REMEMBER If no availability for virtual visit in office,  please schedule another [place] office. Pt will do virtual with dr tomorrow  If no availability at another [facility] office, please instruct patient that they can schedule an evisit or virtual visit through their mychart account. Visits up to 8pm  patients can be seen in office 5 days after positive COVID test

## 2021-03-22 ENCOUNTER — Encounter: Payer: Self-pay | Admitting: Family Medicine

## 2021-03-22 ENCOUNTER — Telehealth (INDEPENDENT_AMBULATORY_CARE_PROVIDER_SITE_OTHER): Payer: No Typology Code available for payment source | Admitting: Family Medicine

## 2021-03-22 DIAGNOSIS — R059 Cough, unspecified: Secondary | ICD-10-CM

## 2021-03-22 DIAGNOSIS — R0981 Nasal congestion: Secondary | ICD-10-CM

## 2021-03-22 DIAGNOSIS — J45909 Unspecified asthma, uncomplicated: Secondary | ICD-10-CM

## 2021-03-22 NOTE — Patient Instructions (Addendum)
   ---------------------------------------------------------------------------------------------------------------------------      SCHOOL SLIP:  Patient Nicole [name],  [birth year], was seen for a medical visit today, 03/22/21 . Please excuse from school for a COVID/flu like illness. If covid testing is positive, because recent studies show most patients remain contagious for 7-10 days, we advise 10 days minimum from the onset of symptoms (11/5/3) PLUS 1 day of no fever and improved symptoms.  Will defer to school for a sooner return IF two Covid19 tests done 48 hours apart are negative and the symptoms have resolved for greater than 24 hours. If following the 5 day guidelines, for Covid19 to protect the health of others, advise that all symptoms have resolved prior to return AND a high quality tightly fitting KN95 or Kf94 mask at all times around others for an additional 5 days.    Sincerely: E-signature: Dr. Kriste Basque, DO [facility] [place] Ph: [phone]   ------------------------------------------------------------------------------------------------------------------------------   HOME CARE TIPS:  -COVID19 testing information: GoldAgenda.[email or web address]  Most pharmacies also offer testing and home test kits. If the Covid19 test is positive and you desire antiviral treatment, please contact a [place] pharmacy or schedule a follow up virtual visit through your primary care office or through the [employer].  Other test to treat options: http://www.vasquez-vaughn.biz/?click_source=alert    -can use tylenol  if needed for fevers, aches and pains per instructions  -use your albuterol every 4-6 hours as needed  -can use nasal saline a few times per day if you have nasal congestion; sometimes  a short course of Afrin nasal spray for 3 days can help with symptoms as well  -stay hydrated, drink plenty of fluids and eat small healthy meals  - avoid dairy  -can take 1000 IU ( ) Vit D3 and 100-500 mg of Vit C daily per instructions   It was nice to see you today, and I really hope you are feeling better soon. I help [place] out with telemedicine visits on Tuesdays and Thursdays and am available for visits on those days. If you have any concerns or questions following this visit please schedule a follow up visit with your Primary Care doctor or seek care at a local urgent care clinic to avoid delays in care.    Seek in person care or schedule a follow up video visit promptly if your symptoms worsen, new concerns arise or you are not improving with treatment. Call 911 and/or seek emergency care if your symptoms are severe or life threatening.

## 2021-03-22 NOTE — Progress Notes (Signed)
Virtual Visit via Video Note I connected with Nicole [name]  on 03/22/21 at 11:00 AM EST by a video enabled telemedicine application and verified that I am speaking with the correct person using two identifiers.  Location patient: home, [place] Location provider:work or home office Persons participating in the virtual visit: patient, provider  I discussed the limitations of evaluation and management by telemedicine and the availability of in person appointments. The patient expressed understanding and agreed to proceed.   HPI:  Acute telemedicine visit for a sick visit: -Onset: 3 days ago -did a covid test last night which was negative -Symptoms include: sore throat, fatigue, nasal congestion, cough, mild SOB at times - has used her albuterol twice and it  resolved with that -Denies: CP, SOB, NVD, fever, inability to eat/drink/get out of bed -otc cough medication is working for her -Pertinent past medical history:see below -Pertinent medication allergies:  Allergies  Allergen Reactions   Cashew Nut Oil Anaphylaxis    Cashews and [place] nuts   Other Anaphylaxis    Nuts-[place] and cashews   Triptans Anaphylaxis   Lexapro [Escitalopram Oxalate]     manic   Cefaclor Rash   Nickel Rash  -COVID-19 vaccine status: Immunization History  Administered Date(s) Administered   Influenza Whole 03/15/2005, 05/15/2010, 02/12/2012   Influenza,inj,Quad PF,6+ Mos 01/08/2014, 01/10/2016, 03/17/2018   Influenza-Unspecified 02/14/2017, 02/27/2019, 03/27/2020, 02/08/2021   Moderna Sars-Covid-2 Vaccination 03/27/2020, 02/08/2021   PFIZER(Purple Top)SARS-COV-2 Vaccination 06/05/2019, 06/26/2019   Pneumococcal Conjugate-13 05/15/2010   Td 05/15/2001   Tdap 01/08/2014  -denies any chance of pregnancy -has insomnia - will be seeing her psychiatrist next week   ROS: See pertinent positives and negatives per HPI.  Past Medical History:  Diagnosis Date   Abnormal pap    w LEEP   Alcoholism (HCC)    Allergy     Asthma    Blood in stool    Depression (emotion)    Frequent headaches    Hepatitis C, chronic (HCC)    hep c tx successful   Irritable bowel syndrome    OSA on CPAP - sees Dr. Jenne Campus in ENT 10/09/2013   Seasonal allergies    scratch test in 2006///mold issue in house    Past Surgical History:  Procedure Laterality Date   DILATION AND CURETTAGE OF UTERUS  1996, 2001, 2002   x 3   LEEP  2008   NASAL FRACTURE SURGERY  1999   repair   NASAL SEPTOPLASTY W/ TURBINOPLASTY       Current Outpatient Medications:    ARIPiprazole (ABILIFY) 15 MG tablet, Take 15 mg by mouth daily., Disp: , Rfl:    azelastine (ASTELIN) 0.1 % nasal spray, Place 1 spray into both nostrils 2 (two) times daily as needed (post nasal drip/runny nose)., Disp: 30 mL, Rfl: 5   ciclesonide (ALVESCO) 160 MCG/ACT inhaler, Inhale 1 puff into the lungs 2 (two) times daily. with spacer and rinse mouth afterwards., Disp: 1 each, Rfl: 5   diphenoxylate-atropine (LOMOTIL) 2.5-0.025 MG tablet, Take 1 tablet by mouth 3 (three) times daily as needed for constipation., Disp: , Rfl:    doxepin (SINEQUAN) 10 MG capsule, Take 10 mg by mouth at bedtime as needed., Disp: , Rfl:    FLUoxetine (PROZAC) 40 MG capsule, Take 40 mg by mouth daily., Disp: , Rfl:    fluticasone (FLONASE) 50 MCG/ACT nasal spray, PLACE 2 SPRAYS INTO BOTH NOSTRILS DAILY, Disp: 16 mL, Rfl: 0   levalbuterol (XOPENEX HFA) 45 MCG/ACT inhaler, Inhale 2  puffs into the lungs every 4 (four) hours as needed for wheezing or shortness of breath., Disp: 1 each, Rfl: 2   levonorgestrel (MIRENA) 20 MCG/24HR IUD, 1 each by Intrauterine route once., Disp: , Rfl:    Multiple Vitamin (MULTIVITAMINS PO), Take 1 tablet by mouth daily. , Disp: , Rfl:    ondansetron (ZOFRAN-ODT) 4 MG disintegrating tablet, Take by mouth., Disp: , Rfl:    Rimegepant Sulfate (NURTEC) 75 MG TBDP, Take 75 mg by mouth daily as needed (take for abortive therapy of migraine, no more than 1 tablet in 24  hours or 10 per month)., Disp: 10 tablet, Rfl: 11   topiramate (TOPAMAX) 25 MG tablet, Take 2 tablets (50 mg total) by mouth daily., Disp: 180 tablet, Rfl: 3  EXAM:  VITALS per patient if applicable:  GENERAL: alert, oriented, appears well and in no acute distress  HEENT: atraumatic, conjunttiva clear, no obvious abnormalities on inspection of external nose and ears  NECK: normal movements of the head and neck  LUNGS: on inspection no signs of respiratory distress, breathing rate appears normal, no obvious gross SOB, gasping or wheezing  CV: no obvious cyanosis  MS: moves all visible extremities without noticeable abnormality  PSYCH/NEURO: pleasant and cooperative, no obvious depression or anxiety, speech and thought processing grossly intact  ASSESSMENT AND PLAN:  Discussed the following assessment and plan:  Nasal congestion  Cough, unspecified type  Asthma, unspecified asthma severity, unspecified whether complicated, unspecified whether persistent  -we discussed possible serious and likely etiologies, treatment, treatment risks and precautions. Pt is agreeable to treatment via telemedicine at this moment. Query VURI with asthma, Covid19, mild influenza vs other. She has opted for using her alb q 4-6 hours prn, OTC cough medication, and other symptomatic care measures summarized in patient instructions. Work/School slipped offered: provided in patient instructions   Follow up with PCP office or in person care if worsening, new symptoms arise, or if is not improving with treatment.  I discussed the assessment and treatment plan with the patient. The patient was provided an opportunity to ask questions and all were answered. The patient agreed with the plan and demonstrated an understanding of the instructions.     Terressa Koyanagi, DO

## 2021-04-13 ENCOUNTER — Other Ambulatory Visit: Payer: Self-pay | Admitting: Family Medicine

## 2021-05-07 ENCOUNTER — Other Ambulatory Visit: Payer: Self-pay

## 2021-05-07 ENCOUNTER — Ambulatory Visit (HOSPITAL_COMMUNITY)
Admission: EM | Admit: 2021-05-07 | Discharge: 2021-05-07 | Disposition: A | Discharge status: Home / Self Care | Payer: 59 | Attending: Internal Medicine | Admitting: Internal Medicine

## 2021-05-07 ENCOUNTER — Encounter (HOSPITAL_COMMUNITY): Payer: Self-pay | Admitting: Emergency Medicine

## 2021-05-07 ENCOUNTER — Ambulatory Visit (INDEPENDENT_AMBULATORY_CARE_PROVIDER_SITE_OTHER): Payer: 59

## 2021-05-07 DIAGNOSIS — U071 COVID-19: Secondary | ICD-10-CM

## 2021-05-07 MED ORDER — MOLNUPIRAVIR 200 MG PO CAPS: 4.0000 | ORAL_CAPSULE | Freq: Two times a day (BID) | ORAL | 0 refills | Status: AC

## 2021-05-07 MED ORDER — HYDROCODONE BIT-HOMATROP MBR 5-1.5 MG/5ML PO SOLN: 5.0000 mL | Freq: Four times a day (QID) | ORAL | 0 refills | Status: DC | PRN

## 2021-05-07 MED ORDER — PREDNISONE 50 MG PO TABS: ORAL_TABLET | ORAL | 0 refills | Status: DC

## 2021-05-07 NOTE — ED Provider Notes (Signed)
[place]    CSN: [id number] Arrival date & time: 05/07/21  1007      History   Chief Complaint Chief Complaint  Patient presents with   Covid Positive   Emesis   Diarrhea   Fever    HPI Nicole [name] is a 45 y.o. female who presents with onset of HA, N/V/D, cough, nose congestion and fever x 2 days. Had a fever of 102.9 lastn ight. Has been taking Mucinex DM and Delsym. 1-2 emesis and 3-4 diarrhea per day. Poor appetite. This am she took Tylenol. Has had 4 covid shots. LMP 3 weeks ago. Has been SOB and been using her inhaler since she has asthma. Her cough is productive.     Past Medical History:  Diagnosis Date   Abnormal pap    w LEEP   Alcoholism ([place])    Allergy    Asthma    Blood in stool    Depression (emotion)    Frequent headaches    Hepatitis C, chronic (HCC)    hep c tx successful   Irritable bowel syndrome    OSA on CPAP - sees Dr. Tami Ribas in ENT 10/09/2013   Seasonal allergies    scratch test in 2006///mold issue in house    Patient Active Problem List   Diagnosis Date Noted   Nonallergic rhinitis 08/23/2020   Mild persistent asthma without complication [id number]   Other allergic rhinitis 06/14/2020   Adverse reaction to food, subsequent encounter 06/14/2020   IBS (irritable bowel syndrome) 05/24/2016   Chronic venous insufficiency 05/24/2016   Migraine 05/24/2016   Pulmonary nodules 10/27/2014   Hepatitis C, chronic ([place])    Depression 10/09/2013   OSA on CPAP - sees Dr. Tami Ribas in ENT 10/09/2013   Allergic rhinitis 05/30/2012   Cough variant asthma 02/08/2012    Past Surgical History:  Procedure Laterality Date   [place], 2001, 2002   x 3   LEEP  2008   NASAL FRACTURE SURGERY  1999   repair   NASAL SEPTOPLASTY W/ TURBINOPLASTY      OB History     Gravida  4   Para      Term      Preterm      AB  4   Living  0      SAB  1   IAB  3   Ectopic      Multiple      Live  Births               Home Medications    Prior to Admission medications   Medication Sig Start Date End Date Taking? Authorizing Provider  HYDROcodone bit-homatropine (HYCODAN) 5-1.5 MG/5ML syrup Take 5 mLs by mouth every 6 (six) hours as needed for cough. 05/07/21  Yes Rodriguez-Southworth, Sunday Spillers, PA-C  molnupiravir EUA (LAGEVRIO) 200 MG CAPS capsule Take 4 capsules (800 mg total) by mouth 2 (two) times daily for 5 days. 05/07/21 05/12/21 Yes Rodriguez-Southworth, Sunday Spillers, PA-C  predniSONE (DELTASONE) 50 MG tablet One qd 05/07/21  Yes Rodriguez-Southworth, Sunday Spillers, PA-C  ARIPiprazole (ABILIFY) 15 MG tablet Take 15 mg by mouth daily.    [provider]  azelastine (ASTELIN) 0.1 % nasal spray Place 1 spray into both nostrils 2 (two) times daily as needed (post nasal drip/runny nose). 08/23/20   Garnet Sierras, DO  ciclesonide (ALVESCO) 160 MCG/ACT inhaler Inhale 1 puff into the lungs 2 (two) times daily. with spacer  and rinse mouth afterwards. 10/15/20   Garnet Sierras, DO  diphenoxylate-atropine (LOMOTIL) 2.5-0.025 MG tablet Take 1 tablet by mouth 3 (three) times daily as needed for constipation. 06/20/18   [provider]  doxepin (SINEQUAN) 10 MG capsule Take 10 mg by mouth at bedtime as needed.    [provider]  FLUoxetine (PROZAC) 40 MG capsule Take 40 mg by mouth daily. 02/13/21   [provider]  fluticasone (FLONASE) 50 MCG/ACT nasal spray SPRAY TWO SPRAYS IN THE AFFECTED NOSTRIL DAILY 04/13/21   Koberlein, Steele Berg, MD  levalbuterol (XOPENEX HFA) 45 MCG/ACT inhaler Inhale 2 puffs into the lungs every 4 (four) hours as needed for wheezing or shortness of breath. 06/14/20   Garnet Sierras, DO  levonorgestrel (MIRENA) 20 MCG/24HR IUD 1 each by Intrauterine route once.    [provider]  Multiple Vitamin (MULTIVITAMINS PO) Take 1 tablet by mouth daily.     [provider]  ondansetron (ZOFRAN-ODT) 4 MG disintegrating tablet Take by mouth. 11/16/20    [provider]  Rimegepant Sulfate (NURTEC) 75 MG TBDP Take 75 mg by mouth daily as needed (take for abortive therapy of migraine, no more than 1 tablet in 24 hours or 10 per month). 07/13/20   Lomax, Amy, NP  topiramate (TOPAMAX) 25 MG tablet Take 2 tablets (50 mg total) by mouth daily. 07/13/20   Lomax, Amy, NP    Family History Family History  Problem Relation Age of Onset   Emphysema Mother    Colon polyps Mother    Diverticulitis Mother    Heart disease Father    Cancer Father        unsure origin; had spot on lung   Hypertension Father    Hepatitis C Father    Alcohol abuse Father    Drug abuse Father    Stroke Maternal Grandmother    Osteoporosis Maternal Grandmother    Alcohol abuse Maternal Grandfather    Liver disease Maternal Grandfather     Social History Social History   Tobacco Use   Smoking status: Former    Packs/day: 1.50    Years: 15.00    Pack years: 22.50    Types: Cigarettes    Quit date: 05/15/2005    Years since quitting: 15.9   Smokeless tobacco: Never   Tobacco comments:    quit in 2007, smoked for 13 years   Vaping Use   Vaping Use: Never used  Substance Use Topics   Alcohol use: No    Alcohol/week: 0.0 standard drinks    Comment: quit drinking on 2004---relapse in 2007---QUIT 07/2005   Drug use: No    Comment: IV drug use///street drugs///inhalants  QUIT 07/2005     Allergies   Cashew nut oil, Other, Triptans, Lexapro [escitalopram oxalate], Cefaclor, and Nickel   Review of Systems Review of Systems  Constitutional:  Positive for appetite change, chills, diaphoresis, fatigue and fever. Negative for activity change.  HENT:  Positive for congestion and postnasal drip. Negative for ear discharge, ear pain, rhinorrhea, sore throat and trouble swallowing.   Eyes:  Negative for discharge.  Respiratory:  Positive for cough, shortness of breath and wheezing. Negative for chest tightness.   Musculoskeletal:  Positive for myalgias.  Skin:   Negative for rash.  Neurological:  Positive for headaches.  Hematological:  Negative for adenopathy.    Physical Exam Triage Vital Signs ED Triage Vitals [05/07/21 1059]  Enc Vitals Group     BP 104/73  Pulse Rate 78     Resp 19     Temp 98.4 F (36.9 C)     Temp Source Oral     SpO2 97 %     Weight      Height      Head Circumference      Peak Flow      Pain Score 7     Pain Loc      Pain Edu?      Excl. in GC?    No data found.  Updated Vital Signs BP 104/73 (BP Location: Right Arm)    Pulse 78    Temp 98.4 F (36.9 C) (Oral)    Resp 19    SpO2 97%   Visual Acuity Right Eye Distance:   Left Eye Distance:   Bilateral Distance:    Right Eye Near:   Left Eye Near:    Bilateral Near:       Physical Exam Vitals signs and nursing note reviewed.  Constitutional:      General: She is not in acute distress.    Appearance: Normal appearance. She is  ill-appearing, but not  toxic-appearing or diaphoretic.  HENT:     Head: Normocephalic.     Right Ear: Tympanic membrane, ear canal and external ear normal.     Left Ear: Tympanic membrane, ear canal and external ear normal.     Nose: Nose normal.     Mouth/Throat:     Mouth: Mucous membranes are moist.  Eyes:     General: No scleral icterus.       Right eye: No discharge.        Left eye: No discharge.     Conjunctiva/sclera: Conjunctivae normal.  Neck:     Musculoskeletal: Neck supple. No neck rigidity.  Cardiovascular:     Rate and Rhythm: Normal rate and regular rhythm.     Heart sounds: No murmur.  Pulmonary:     Effort: Pulmonary effort is normal.     Breath sounds: Normal breath sounds.  Abdominal:     General: Bowel sounds are normal. There is no distension.     Palpations: Abdomen is soft. There is no mass.     Tenderness: There is no abdominal tenderness. There is no guarding or rebound.     Hernia: No hernia is present.  Musculoskeletal: Normal range of motion.  Lymphadenopathy:      Cervical: No cervical adenopathy.  Skin:    General: Skin is warm and dry.     Coloration: Skin is not jaundiced.     Findings: No rash.  Neurological:     Mental Status: She is alert and oriented to person, place, and time.     Gait: Gait normal.  Psychiatric:        Mood and Affect: Mood normal.        Behavior: Behavior normal.        Thought Content: Thought content normal.        Judgment: Judgment normal.   UC Treatments / Results  Labs (all labs ordered are listed, but only abnormal results are displayed) Labs Reviewed - No data to display  EKG   Radiology DG Chest 2 View  Result Date: 05/07/2021 CLINICAL DATA:  Productive cough, fever, COVID positive EXAM: CHEST - 2 VIEW COMPARISON:  04/12/2020 FINDINGS: The heart size and mediastinal contours are within normal limits. Both lungs are clear. The visualized skeletal structures are unremarkable except for a left cervical rib  noted. Trachea midline. IMPRESSION: No active cardiopulmonary disease. Electronically Signed   By: Jerilynn Mages.  Shick M.D.   On: 05/07/2021 11:40    Procedures Procedures (including critical care time)  Medications Ordered in UC Medications - No data to display  Initial Impression / Assessment and Plan / UC Course  I have reviewed the triage vital signs and the nursing notes. Pertinent  imaging results that were available during my care of the patient were reviewed by me and considered in my medical decision making (see chart for details).  Covid infection I placed her on Molnupiravir as noted, as well as Prednisone and Hycodan. See instructions.    Final Clinical Impressions(s) / UC Diagnoses   Final diagnoses:  COVID-19 virus infection     Discharge Instructions      Stay quarantined for 5 days from the onset of your symptoms, and then the next 5 days you may go out in the public, but you need to wear a mask. No mast after that.    Don't lay around, keep active and walk as much as you are  able to to prevent worsening of your symptoms.  Follow up with your family Dr next week.  If you get short of breath and you are able to check  your oxygen with a pulse oxygen meter, if it gets to 92% or less, you need to go to the hospital to be admitted. If you dont have one, come back here and we will assess you.       ED Prescriptions     Medication Sig Dispense Auth. Provider   molnupiravir EUA (LAGEVRIO) 200 MG CAPS capsule Take 4 capsules (800 mg total) by mouth 2 (two) times daily for 5 days. 40 capsule Rodriguez-Southworth, Sunday Spillers, PA-C   predniSONE (DELTASONE) 50 MG tablet One qd 5 tablet Rodriguez-Southworth, Gwendoline Judy, PA-C   HYDROcodone bit-homatropine (HYCODAN) 5-1.5 MG/5ML syrup Take 5 mLs by mouth every 6 (six) hours as needed for cough. 120 mL Rodriguez-Southworth, Sunday Spillers, PA-C      I have reviewed the PDMP during this encounter.   Shelby Mattocks, PA-C 05/07/21 1154

## 2021-05-07 NOTE — Discharge Instructions (Signed)
Stay quarantined for 5 days from the onset of your symptoms, and then the next 5 days you may go out in the public, but you need to wear a mask. No mast after that.    Don't lay around, keep active and walk as much as you are able to to prevent worsening of your symptoms.  Follow up with your family Dr next week.  If you get short of breath and you are able to check  your oxygen with a pulse oxygen meter, if it gets to 92% or less, you need to go to the hospital to be admitted. If you dont have one, come back here and we will assess you.

## 2021-05-07 NOTE — ED Triage Notes (Signed)
Pt reports on Thursday had migraine, n/v/d, cough, congestion, fevers. Home covid test positive. Took mucinex DM and dylsym for cough. Fever last night 102.9.

## 2021-06-10 ENCOUNTER — Ambulatory Visit: Payer: 59 | Admitting: Family Medicine

## 2021-06-15 ENCOUNTER — Telehealth: Payer: Self-pay

## 2021-06-15 NOTE — Telephone Encounter (Signed)
I have submitted a PA request for Nurtec on CMM, Key: [id number] - PA Case ID: [id number] . Awaiting determination from [employer].

## 2021-06-17 NOTE — Telephone Encounter (Signed)
NURTEC TAB 75MG  ODT has been approved for up to 8 per month until 12/27/2021  Insurance will not cover 10 tablet. Pharmacy can run for 8 tab

## 2021-06-20 ENCOUNTER — Other Ambulatory Visit: Payer: Self-pay | Admitting: Neurology

## 2021-06-20 DIAGNOSIS — G43109 Migraine with aura, not intractable, without status migrainosus: Secondary | ICD-10-CM

## 2021-06-20 DIAGNOSIS — G4733 Obstructive sleep apnea (adult) (pediatric): Secondary | ICD-10-CM

## 2021-06-20 MED ORDER — NURTEC 75 MG PO TBDP: 75.0000 mg | ORAL_TABLET | Freq: Every day | ORAL | 11 refills | Status: DC | PRN

## 2021-08-02 ENCOUNTER — Telehealth: Payer: Self-pay | Admitting: Family Medicine

## 2021-08-02 NOTE — Telephone Encounter (Signed)
Pt has upcoming apt scheduled with Dr Frances Furbish tomorrow on 3/22. Medication can be refilled at that visit depending MD decision based on visit ?

## 2021-08-02 NOTE — Telephone Encounter (Signed)
Pt request refill for topiramate (TOPAMAX) 25 MG tablet at [facility] [id number] ?

## 2021-08-03 ENCOUNTER — Ambulatory Visit: Payer: Self-pay | Admitting: Neurology

## 2021-08-03 ENCOUNTER — Telehealth: Payer: Self-pay | Admitting: *Deleted

## 2021-08-03 ENCOUNTER — Telehealth: Payer: Self-pay | Admitting: Neurology

## 2021-08-03 DIAGNOSIS — G43109 Migraine with aura, not intractable, without status migrainosus: Secondary | ICD-10-CM

## 2021-08-03 MED ORDER — TOPIRAMATE 25 MG PO TABS: 50.0000 mg | ORAL_TABLET | Freq: Every day | ORAL | 0 refills | Status: DC

## 2021-08-03 NOTE — Telephone Encounter (Signed)
Pt has appt today, will need to bring sd card / machine for cpap DL.  ?

## 2021-08-03 NOTE — Telephone Encounter (Signed)
LMVM for pt to bring machine power cord for her appt today.  ?

## 2021-08-03 NOTE — Telephone Encounter (Signed)
One refill sent to pharmacy on file. ?

## 2021-08-09 ENCOUNTER — Ambulatory Visit: Payer: 59 | Admitting: Neurology

## 2021-08-09 ENCOUNTER — Encounter: Payer: Self-pay | Admitting: Neurology

## 2021-08-09 ENCOUNTER — Other Ambulatory Visit: Payer: Self-pay

## 2021-08-09 VITALS — BP 94/65 | HR 91 | Ht 60.0 in | Wt 155.0 lb

## 2021-08-09 DIAGNOSIS — G43109 Migraine with aura, not intractable, without status migrainosus: Secondary | ICD-10-CM

## 2021-08-09 DIAGNOSIS — G4733 Obstructive sleep apnea (adult) (pediatric): Secondary | ICD-10-CM | POA: Diagnosis not present

## 2021-08-09 DIAGNOSIS — R634 Abnormal weight loss: Secondary | ICD-10-CM | POA: Diagnosis not present

## 2021-08-09 MED ORDER — TOPIRAMATE 25 MG PO TABS: 75.0000 mg | ORAL_TABLET | Freq: Every day | ORAL | 5 refills | Status: DC

## 2021-08-09 NOTE — Progress Notes (Signed)
Subjective:  ?  ?Patient ID: Nicole [name] is a 46 y.o. female. ? ?HPI ? ? ? ?Interim history:  ? ?Nicole [name] is a 46 year old right-handed woman with an underlying medical history of hepatitis, status post treatment, irritable bowel syndrome, sleep apnea, on CPAP therapy, seasonal allergies, asthma, depression, remote history of substance use disorder, and obesity, who Presents for follow-up consultation of her migraine headaches well as sleep apnea, on AutoPap therapy.  The patient is unaccompanied today. I last saw her on 05/19/19, at which time she reported a prior Dx of OSA. She was compliant with her old autoPAP machine. She had an interim home sleep test on 06/02/2019 which indicated an AHI of 35.7/h, O2 nadir was quite benign at 92%.  ? ?She last saw Nicole Dapper, NP in a video visit on 07/13/2020, at which time she was on low-dose topiramate at the time and was advised to increase it to 50 mg daily.  She was advised to continue to use Nurtec as needed. ? ?She saw Nicole [name] on 09/23/2019, at which time she reported improved sleep quality after starting AutoPap therapy.  She was compliant with treatment.  AHI was borderline at 5/h. ? ?Today, 08/09/2021: She reports doing quite well with regards to her migraine and from the sleep standpoint.  She has lost quite a bit of weight, down from approximately 215 pounds to currently 155 pounds.  She stopped using her AutoPap in September 2020.  She was compliant with treatment and also benefited from it but she is sleeping quite well without it at this time.  She feels that Topamax has been helpful, there was an approximately 2-week period recently during which she did not have the refill.  She reports that during that time her migraines flared up.  She has been back on Topamax 50 mg daily for the past week.  She has been utilizing Nurtec as needed and feels it is helpful, in an average month, she does not need to use it more than 3-4 times typically.  Barometric pressure changes  are triggers for her migraines.  She reports that she is from her husband since 6 months ago.  She would be willing to get retested with regards to her sleep apnea, her last sleep test was in January 2021.  Sometimes she has increase in pressure sensation in the back of her head and it may radiate forward.  If it starts as a pressure sensation one-sided may also radiate to the other side. ?She is typically up-to-date with her eye examination and had a new eyeglasses prescription about 6 months ago. ? ?The patient's allergies, current medications, family history, past medical history, past social history, past surgical history and problem list were reviewed and updated as appropriate.  ?  ? ?Previously:  ? ?05/19/19: (She) was previously diagnosed with obstructive sleep apnea and placed on CPAP therapy.  Prior sleep study results are not available  for my review today.  Her Epworth sleepiness score is 6 out of 24 today.  She is compliant with her current AutoPap machine which is S9 AutoSet.  In the past 90 days she has used her machine is days greater than 4 hours at 97%, indicating excellent compliance, average usage of 8 hours and 41 minutes, dates: 02/19/2019 through 05/19/2019.  Average AHI at goal at 3.6/h, average 95th percentile of pressure at 10.5 cm with a Default range of 4 to 20 cm, leak consistently high with a 95th percentile at 56.3 L/min.  She reports that she is a side sleeper.  Her mask is a Quatro full facemask size small.  She indicates full compliance with her AutoPap machine but she has seen an error message from the machine indicating problems with the motor.  She is doing a little better as far as her headaches, she has been using Flexeril nearly nightly.  She has tried Vanuatubrelvy once for a migraine that started in the evening.  She had no side effects and the next day she had no residual migrainous symptoms.  Her bedtime is generally between 930 and 10 PM and rise time around 8.  She suspects that  her father had sleep apnea, he passed away at 8165.  She denies night to night nocturia. ?  ?She had a brain MRI with and without contrast on 05/07/2019 and I reviewed the results: IMPRESSION:  ?  ?Normal MRI brain (with and without). ?  ?   ?  ?04/28/2019: (She) reports recurrent headaches/migraines for the past several years.  She presented to the emergency room on 04/09/2019 with headaches, she was treated symptomatically with IV fluids, Toradol, Compazine, Benadryl, and Decadron.  She reported throat tightness and chest tightness after taking Maxalt.  She had a similar response to Imitrex in the past.  I reviewed the emergency room records.  [facility] but never actually had an appointment, canceled twice, from what I can see.  ?She has not had a brain MRI.  She had a head CT in 2000 which was negative for any acute findings.  She is not aware of any family history of migraines but also reports that she does not know her full family history.  She has been referred to pulmonary sleep medicine for her sleep apnea but has not had a visit yet.  She has an older CPAP machine.  Her weight has been fluctuating, lately she has gained.  She has been working from home since the pandemic, she works as a Radiographer, therapeuticdigital marketing specialist.  She quit smoking in 2007, she tries to hydrate well with water.  She reports a migraine about once a month, she can tell by her visual aura.  She has associated photophobia and sonophobia and nausea, typically no vomiting.  She tried the Maxalt once but got scared after having the chest tightness and throat tightness.  She was advised not to continue to take the Fioricet for fear of rebound headaches when she went to the ER.  She has not found the Fioricet helpful.  She was also discouraged to use ibuprofen by her primary care physician.  She has other intermittent headaches that started in the back of her head and radiate forward to the temples.  She can  tell the difference between those 2 headaches as the migraine headache is often one-sided and throbbing associated with the light sensitivity and nausea and preceded by the aura.  She has noted dehydration as a trigger and tries to hydrate well and also utilizes Pedialyte up to twice a week.  She lives with her husband, they have no children, no pets in the household and she limits her caffeine to 2 cups of coffee per day.  She tries to keep a set sleep schedule and tries to achieve about 8 hours of sleep.  She had an eye examination this year in April and her prescription eyeglasses are up-to-date, her prescription tends to change with every visit. ? ?Her Past Medical History Is Significant For: ?  Past Medical History:  ?Diagnosis Date  ? Abnormal pap   ? w LEEP  ? Alcoholism ([facility])   ? Allergy   ? Asthma   ? Blood in stool   ? Depression (emotion)   ? Frequent headaches   ? Hepatitis C, chronic ([facility]CC)   ? hep c tx successful  ? Irritable bowel syndrome   ? OSA on CPAP - sees Dr. Jenne Campus in ENT 10/09/2013  ? Seasonal allergies   ? scratch test in 2006///mold issue in house  ? ? ?Her Past Surgical History Is Significant For: ?Past Surgical History:  ?Procedure Laterality Date  ? DILATION AND CURETTAGE OF UTERUS  1996, 2001, 2002  ? x 3  ? LEEP  2008  ? NASAL FRACTURE SURGERY  1999  ? repair  ? NASAL SEPTOPLASTY W/ TURBINOPLASTY    ? ? ?Her Family History Is Significant For: ?Family History  ?Problem Relation Age of Onset  ? Emphysema Mother   ? Colon polyps Mother   ? Diverticulitis Mother   ? Heart disease Father   ? Cancer Father   ?     unsure origin; had spot on lung  ? Hypertension Father   ? Hepatitis C Father   ? Alcohol abuse Father   ? Drug abuse Father   ? Stroke Maternal Grandmother   ? Osteoporosis Maternal Grandmother   ? Alcohol abuse Maternal Grandfather   ? Liver disease Maternal Grandfather   ? Migraines Neg Hx   ? ? ?Her Social History Is Significant For: ?Social History  ? ?Socioeconomic History  ?  Marital status: Married  ?  Spouse name: Not on file  ? Number of children: 0  ? Years of education: Not on file  ? Highest education level: Not on file  ?Occupational History  ? Occupation: Risk analyst

## 2021-08-10 NOTE — Progress Notes (Signed)
Ross Ludwig, RN; Dimas Millin ?got it   ? ?  ?Previous Messages ?  ?----- Message -----  ?From: Guy Begin, RN  ?Sent: 08/09/2021   4:35 PM EDT  ?To: Wilford Sports  ? ?Good afternoon,  ? ?This pt has [employer] D[employer] (an [employer] co)  ? ?[name]  ?Female, 46 y.o., 1975-06-05  ?MRN:  ?[id number]  ? ? ? She may not need to use her autoPAP d/t wt loss, but I am wondering if she owns the machine? she should have a set up date in 2021? she reports still getting bills about the machine from DME, can you assist in clarifying?  Thanks  ? ?Sandy RN  ? ?

## 2021-08-15 ENCOUNTER — Telehealth: Payer: Self-pay

## 2021-08-15 NOTE — Telephone Encounter (Signed)
LVM for pt to call me back to schedule sleep study  

## 2021-08-18 ENCOUNTER — Telehealth: Payer: Self-pay | Admitting: *Deleted

## 2021-08-18 NOTE — Telephone Encounter (Signed)
Tried to assist with pt and her DME/ autopap machine if she owns? Set up date in 2021? Still getting bills.  I gave her the number recommended by billing that Morrie Sheldon gave me. [phone] ? ?

## 2021-08-24 ENCOUNTER — Telehealth: Payer: Self-pay

## 2021-08-24 NOTE — Telephone Encounter (Signed)
Great! Thanks.  ? ?I've notated her account.  ? ?Trula Ore   ? ?  ?Previous Messages ?  ?----- Message -----  ?From: Guy Begin, RN  ?Sent: 08/22/2021   8:01 AM EDT  ?To: Wilford Sports  ? ?Wanted to let you know I did give her the # (she did read my message about calling billing).  Andrey Campanile RN  ?----- Message -----  ?From: Jari Favre  ?Sent: 08/18/2021   1:49 PM EDT  ?To: Guy Begin, RN  ? ?I called her and LVM stating that she will need to call the billing department. Would it be possible for you to giver her the number???  ? ?[phone]  ? ?----- Message -----  ?From: Guy Begin, RN  ?Sent: 08/18/2021  12:48 PM EDT  ?To: Wilford Sports  ? ?Good afternoon,  ? ?Were you able to find out about the pt autopap machine.   Andrey Campanile Rn  ? ?Nicole [name]  ?Female, 46 y.o., December 07, 1975  ?MRN:  ?[id number]   ? ?

## 2021-08-24 NOTE — Telephone Encounter (Signed)
LVM for pt to call me back to schedule sleep study  

## 2021-09-07 ENCOUNTER — Telehealth: Payer: Self-pay | Admitting: Neurology

## 2021-09-07 NOTE — Telephone Encounter (Signed)
Pt cancelled appt due to can not afford it at this time.Was transferring pt to billing and she hung up. ?

## 2021-12-27 ENCOUNTER — Telehealth: Payer: Self-pay

## 2021-12-27 NOTE — Telephone Encounter (Signed)
(  Key: [id number])  Your information has been submitted to [facility]. [employer] [place] will review the request and notify you of the determination decision directly, typically within 72 hours of receiving all information.  You will also receive your request decision electronically. To check for an update later, open this request again from your dashboard.  If [employer] has not responded within the specified timeframe or if you have any questions about your PA submission, contact [employer] [place] directly at [phone].

## 2022-01-03 NOTE — Telephone Encounter (Signed)
PA approved for the patient 12/27/21-03/20/2022 through [place].

## 2022-01-23 ENCOUNTER — Other Ambulatory Visit: Payer: Self-pay | Admitting: Physician Assistant

## 2022-01-23 DIAGNOSIS — Z1231 Encounter for screening mammogram for malignant neoplasm of breast: Secondary | ICD-10-CM

## 2022-01-25 ENCOUNTER — Ambulatory Visit
Admission: RE | Admit: 2022-01-25 | Discharge: 2022-01-25 | Disposition: A | Discharge status: Home / Self Care | Payer: BC Managed Care – PPO | Source: Ambulatory Visit | Attending: Physician Assistant | Admitting: Physician Assistant

## 2022-01-25 DIAGNOSIS — Z1231 Encounter for screening mammogram for malignant neoplasm of breast: Secondary | ICD-10-CM

## 2022-01-27 ENCOUNTER — Other Ambulatory Visit: Payer: Self-pay | Admitting: Physician Assistant

## 2022-01-27 DIAGNOSIS — R928 Other abnormal and inconclusive findings on diagnostic imaging of breast: Secondary | ICD-10-CM

## 2022-02-07 ENCOUNTER — Other Ambulatory Visit: Payer: Self-pay | Admitting: Physician Assistant

## 2022-02-07 ENCOUNTER — Ambulatory Visit
Admission: RE | Admit: 2022-02-07 | Discharge: 2022-02-07 | Disposition: A | Discharge status: Home / Self Care | Payer: BC Managed Care – PPO | Source: Ambulatory Visit | Attending: Physician Assistant | Admitting: Physician Assistant

## 2022-02-07 DIAGNOSIS — R928 Other abnormal and inconclusive findings on diagnostic imaging of breast: Secondary | ICD-10-CM

## 2022-02-07 DIAGNOSIS — N6310 Unspecified lump in the right breast, unspecified quadrant: Secondary | ICD-10-CM

## 2022-02-08 NOTE — Progress Notes (Deleted)
PATIENT: Nicole [name] DOB: [birth year]  REASON FOR VISIT: follow up HISTORY FROM: patient  No chief complaint on file.    HISTORY OF PRESENT ILLNESS:  02/08/22 ALL: She returns for follow up for OSA on CPAP and migraines. She was last seen by Dr Frances Furbish 07/2021 and topiramate was increased to 75mg  at bedtime. Nurtec continued PRN and repeat HST ordered due to weight loss. Since,   She did not complete HST due to financial concerns.   08/09/2021 SA:  She reports doing quite well with regards to her migraine and from the sleep standpoint.  She has lost quite a bit of weight, down from approximately 215 pounds to currently 155 pounds.  She stopped using her AutoPap in September 2020.  She was compliant with treatment and also benefited from it but she is sleeping quite well without it at this time. She feels that Topamax has been helpful, there was an approximately 2-week period recently during which she did not have the refill.  She reports that during that time her migraines flared up.  She has been back on Topamax 50 mg daily for the past week.  She has been utilizing Nurtec as needed and feels it is helpful, in an average month, she does not need to use it more than 3-4 times typically.  Barometric pressure changes are triggers for her migraines.  She reports that she is from her husband since 6 months ago.  She would be willing to get retested with regards to her sleep apnea, her last sleep test was in January 2021. Sometimes she has increase in pressure sensation in the back of her head and it may radiate forward.  If it starts as a pressure sensation one-sided may also radiate to the other side. She is typically up-to-date with her eye examination and had a new eyeglasses prescription about 6 months ago.  07/13/20 ALL:  Nicole [name] is a 46 y.o. female here today for follow up for OSA on CPAP and migraines. She reports that migraines have worsened. She continues topiramate 25mg  daily and  Nurtec for abortive therapy. She has about 4-5 headache days per month with 3-4 being migrainous. Nurtec works about half of the time. She feels stress is most likely trigger. She is having more stress at work. She has an appt with her psychiatrist on March 10th to discuss adding anxiety medications. She is doing very well on CPAP. She is using CPAP nightly.  09/23/2019 ALL: Nicole [name] is a 46 y.o. female here today for follow up for obstructive sleep apnea on CPAP as well as migraines she reports doing very well on CPAP therapy.  She is using CPAP nightly and for greater than 4 hours each night.  She denies any concerns with her mask or supplies.  She does note improved sleep quality with using CPAP.  She also feels that frequency of headaches has reduced slightly but she continues to have regular, debilitating headaches.  She has taken [name] but does not feel that its effective.  She is also used over-the-counter analgesics on occasion but denies regular use.  She reports having at least 10-12 headache days per month.  She feels that 3-4 of these are migrainous.  She is interested in considering a preventative medication.  She does have a significant history of depression but feels that she is very well managed on current treatment plan.  She is seen regularly by psychiatry.  She also has a history of  asthma but states that this is mild intermittent and she rarely uses Ventolin.  She denies any history of kidney stones.  Compliance report dated 08/23/2019 through 09/21/2019 reveals that she has used CPAP every night for compliance of 100%.  She is used CPAP greater than 4 hours every night for compliance of 100%.  Residual AHI is 5.0 on 6 to 12 cm of water and an EPR of 3.  There was no significant leak noted.  HISTORY: (copied from Dr Guadelupe Sabin note on 05/19/2019)  Dear Dr. Ethlyn Gallery,    I saw Nicole [name], upon your kind request in my sleep clinic today for initial consultation of her obstructive sleep apnea.   The patient is unaccompanied today.  I recently saw her on 04/28/2019 for evaluation of her recurrent migraines.  I provided a prescription for Flexeril as needed and Ubrelvy as needed.  As you know, Ms. 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  Prior sleep study results are not available  for my review today.  Her Epworth sleepiness score is 6 out of 24 today.  She is compliant with her current AutoPap machine which is S9 AutoSet.  In the past 90 days she has used her machine is days greater than 4 hours at 97%, indicating excellent compliance, average usage of 8 hours and 41 minutes, dates: 02/19/2019 through 05/19/2019.  Average AHI at goal at 3.6/h, average 95th percentile of pressure at 10.5 cm with a Default range of 4 to 20 cm, leak consistently high with a 95th percentile at 56.3 L/min.  She reports that she is a side sleeper.  Her mask is a Quatro full facemask size small.  She indicates full compliance with her AutoPap machine but she has seen an error message from the machine indicating problems with the motor.  She is doing a little better as far as her headaches, she has been using Flexeril nearly nightly.  She has tried [place] once for a migraine that started in the evening.  She had no side effects and the next day she had no residual migrainous symptoms.  Her bedtime is generally between 930 and 10 PM and rise time around 8.  She suspects that her father had sleep apnea, he passed away at 69.  She denies night to night nocturia.   She had a brain MRI with and without contrast on 05/07/2019 and I reviewed the  results: IMPRESSION:    Normal MRI brain (with and without).     The patient's allergies, current medications, family history, past medical history, past social history, past surgical history and problem list were reviewed and updated as appropriate.    Previously:    04/28/2019: (She) reports recurrent headaches/migraines for the past several years.  She presented to the emergency room on 04/09/2019 with headaches, she was treated symptomatically with IV fluids, Toradol, Compazine, Benadryl, and Decadron.  She reported throat tightness and chest tightness after taking Maxalt.  She had a similar response to Imitrex in the past.  I reviewed the emergency room records.  She was referred to [facility] last year but never actually had an appointment, canceled twice, from what I can see.  She has not had a brain MRI.  She had a head CT in 2000 which was negative for any acute findings.  She is not aware of any family history of migraines but also reports that she does not know her full family history.  She has been referred to pulmonary sleep medicine for her sleep apnea but has not had a visit yet.  She has an older CPAP machine.  Her weight has been fluctuating, lately she has gained.  She has been working from home since the pandemic, she works as a Radiographer, therapeutic.  She quit smoking in 2007, she tries to hydrate well with water.  She reports a migraine about once a month, she can tell by her visual aura.  She has associated photophobia and sonophobia and nausea, typically no vomiting.  She tried the Maxalt once but got scared after having the chest tightness and throat tightness.  She was advised not to continue to take the Fioricet for fear of rebound headaches when she went to the ER.  She has not found the Fioricet helpful.  She was also discouraged to use ibuprofen by her primary care physician.  She has other intermittent headaches that started in the back of her head and radiate  forward to the temples.  She can tell the difference between those 2 headaches as the migraine headache is often one-sided and throbbing associated with the light sensitivity and nausea and preceded by the aura.  She has noted dehydration as a trigger and tries to hydrate well and also utilizes Pedialyte up to twice a week.  She lives with her husband, they have no children, no pets in the household and she limits her caffeine to 2 cups of coffee per day.  She tries to keep a set sleep schedule and tries to achieve about 8 hours of sleep.  She had an eye examination this year in April and her prescription eyeglasses are up-to-date, her prescription tends to change with every visit.  REVIEW OF SYSTEMS: Out of a complete 14 system review of symptoms, the patient complains only of the following symptoms, headaches, depression and all other reviewed systems are negative.  ALLERGIES: Allergies  Allergen Reactions   Cashew Nut Oil Anaphylaxis    Cashews and [place] nuts   Other Anaphylaxis    Nuts-[place] and cashews   Triptans Anaphylaxis   Lexapro [Escitalopram Oxalate]     manic   Cefaclor Rash   Nickel Rash    HOME MEDICATIONS: Outpatient Medications Prior to Visit  Medication Sig Dispense Refill   ARIPiprazole (ABILIFY) 15 MG tablet Take 15 mg by mouth daily.     azelastine (ASTELIN) 0.1 % nasal spray Place 1 spray into both nostrils 2 (two) times daily as needed (post nasal drip/runny nose). 30 mL 5   ciclesonide (ALVESCO) 160 MCG/ACT inhaler Inhale 1 puff into the lungs 2 (two) times daily. with spacer and rinse mouth afterwards. 1 each 5   diphenoxylate-atropine (LOMOTIL) 2.5-0.025 MG tablet Take 1 tablet by mouth 3 (three) times daily as needed for constipation.     doxepin (SINEQUAN) 10 MG capsule Take 10 mg by mouth at bedtime as needed.     FLUoxetine (PROZAC) 40 MG capsule Take 40 mg by mouth daily.     fluticasone (FLONASE) 50 MCG/ACT nasal spray SPRAY TWO SPRAYS IN THE AFFECTED  NOSTRIL DAILY 16 mL 5   HYDROcodone bit-homatropine (HYCODAN) 5-1.5 MG/5ML syrup Take 5 mLs by mouth every 6 (six) hours as  needed for cough. 120 mL 0   levalbuterol (XOPENEX HFA) 45 MCG/ACT inhaler Inhale 2 puffs into the lungs every 4 (four) hours as needed for wheezing or shortness of breath. 1 each 2   levonorgestrel (MIRENA) 20 MCG/24HR IUD 1 each by Intrauterine route once.     Multiple Vitamin (MULTIVITAMINS PO) Take 1 tablet by mouth daily.      ondansetron (ZOFRAN-ODT) 4 MG disintegrating tablet Take by mouth as needed.     predniSONE (DELTASONE) 50 MG tablet One qd 5 tablet 0   Rimegepant Sulfate (NURTEC) 75 MG TBDP Take 75 mg by mouth daily as needed (take for abortive therapy of migraine, no more than 1 tablet in 24 hours or 10 per month). 8 tablet 11   topiramate (TOPAMAX) 25 MG tablet Take 3 tablets (75 mg total) by mouth daily. 90 tablet 5   No facility-administered medications prior to visit.    PAST MEDICAL HISTORY: Past Medical History:  Diagnosis Date   Abnormal pap    w LEEP   Alcoholism (HCC)    Allergy    Asthma    Blood in stool    Depression (emotion)    Frequent headaches    Hepatitis C, chronic (HCC)    hep c tx successful   Irritable bowel syndrome    OSA on CPAP - sees Dr. Jenne Campus in ENT 10/09/2013   Seasonal allergies    scratch test in 2006///mold issue in house    PAST SURGICAL HISTORY: Past Surgical History:  Procedure Laterality Date   DILATION AND CURETTAGE OF UTERUS  1996, 2001, 2002   x 3   LEEP  2008   NASAL FRACTURE SURGERY  1999   repair   NASAL SEPTOPLASTY W/ TURBINOPLASTY      FAMILY HISTORY: Family History  Problem Relation Age of Onset   Emphysema Mother    Colon polyps Mother    Diverticulitis Mother    Heart disease Father    Cancer Father        unsure origin; had spot on lung   Hypertension Father    Hepatitis C Father    Alcohol abuse Father    Drug abuse Father    Stroke Maternal Grandmother    Osteoporosis  Maternal Grandmother    Alcohol abuse Maternal Grandfather    Liver disease Maternal Grandfather    Migraines Neg Hx     SOCIAL HISTORY: Social History   Socioeconomic History   Marital status: Unknown    Spouse name: Not on file   Number of children: 0   Years of education: Not on file   Highest education level: Not on file  Occupational History   Occupation: Adult nurse: [employer]  Tobacco Use   Smoking status: Former    Packs/day: 1.50    Years: 15.00    Total pack years: 22.50    Types: Cigarettes    Quit date: 05/15/2005    Years since quitting: 16.7   Smokeless tobacco: Never   Tobacco comments:    quit in 2007, smoked for 13 years   Vaping Use   Vaping Use: Never used  Substance and Sexual Activity   Alcohol use: No    Alcohol/week: 0.0 standard drinks of alcohol    Comment: quit drinking on 2004---relapse in 2007---QUIT 07/2005   Drug use: No    Comment: IV drug use///street drugs///inhalants  QUIT 07/2005   Sexual activity: Yes    Partners: Male  Birth control/protection: Pill    Comment: First sexual intercourse at age 46,  more than 5 sexual partners   Other Topics Concern   Not on file  Social History Narrative   Work or School: Glass blower/designergraphic designer      Home Situation: lives with husband      Spiritual Beliefs: none      Lifestyle: no regular exercise; diet is fair               Social Determinants of Corporate investment bankerHealth   Financial Resource Strain: Not on file  Food Insecurity: Not on file  Transportation Needs: Not on file  Physical Activity: Not on file  Stress: Not on file  Social Connections: Not on file  Intimate Partner Violence: Not on file      PHYSICAL EXAM  There were no vitals filed for this visit.  There is no height or weight on file to calculate BMI.  Generalized: Well developed, in no acute distress  Cardiology: normal rate and rhythm, no murmur noted Respiratory: clear to auscultation bilaterally   Neurological examination  Mentation: Alert oriented to time, place, history taking. Follows all commands speech and language fluent Cranial nerve II-XII: Pupils were equal round reactive to light. Extraocular movements were full, visual field were full  Motor: The motor testing reveals 5 over 5 strength of all 4 extremities. Good symmetric motor tone is noted throughout.  Gait and station: Gait is normal.   DIAGNOSTIC DATA (LABS, IMAGING, TESTING) - I reviewed patient records, labs, notes, testing and imaging myself where available.      No data to display           Lab Results  Component Value Date   WBC 8.9 10/12/2020   HGB 16.1 (H) 10/12/2020   HCT 46.1 (H) 10/12/2020   MCV 91.7 10/12/2020   PLT 325 10/12/2020      Component Value Date/Time   NA 140 10/12/2020 1425   K 3.9 10/12/2020 1425   CL 110 10/12/2020 1425   CO2 24 10/12/2020 1425   GLUCOSE 105 (H) 10/12/2020 1425   BUN 11 10/12/2020 1425   CREATININE 1.04 (H) 10/12/2020 1425   CREATININE 0.96 11/01/2018 1507   CALCIUM 9.3 10/12/2020 1425   PROT 6.7 08/13/2020 0926   ALBUMIN 4.2 08/13/2020 0926   AST 16 08/13/2020 0926   ALT 28 08/13/2020 0926   ALKPHOS 52 08/13/2020 0926   BILITOT 0.3 08/13/2020 0926   GFRNONAA >60 10/12/2020 1425   GFRAA >90 06/21/2011 1846   Lab Results  Component Value Date   CHOL 180 08/13/2020   HDL 50.70 08/13/2020   LDLCALC 95 08/13/2020   LDLDIRECT 111.0 08/06/2019   TRIG 171.0 (H) 08/13/2020   CHOLHDL 4 08/13/2020   Lab Results  Component Value Date   HGBA1C 5.4 08/06/2019   No results found for: "VITAMINB12" Lab Results  Component Value Date   TSH 3.42 08/06/2019     ASSESSMENT AND PLAN 46 y.o. year old female  has a past medical history of Abnormal pap, Alcoholism (HCC), Allergy, Asthma, Blood in stool, Depression (emotion), Frequent headaches, Hepatitis C, chronic (HCC), Irritable bowel syndrome, OSA on CPAP - sees Dr. Jenne CampusMcQueen in ENT (10/09/2013), and Seasonal  allergies. here with   No diagnosis found.   Jacki ConesLaurie is doing very well with CPAP therapy.  Compliance report reveals excellent compliance.  She was encouraged to continue using CPAP nightly and for greater than 4 hours each night.  I will repeat download  in approximately 4 weeks to ensure that AHI remains at 5 or below.  CPAP is currently set to auto titrate from 6 to 12 cm of water pressure.  Median pressure of 11.3 with 95th percentile of 12 cm of water.  We may consider increasing maximum pressure if AHI does not remain under 5.  She is doing very well at this time.  She does report continued headaches.  She would like to start migraine prevention medications.  We have discussed multiple options.  She is most interested in topiramate as she feels that this will also be helpful for weight loss.  She is currently using Mirena for birth control.  I have reviewed warnings associated with use of topiramate with hormonal contraceptives, however, we will start with a low dose that I do not feel will affect efficacy of birth control.  We have also discussed potential for worsening depression.  She feels that symptoms have been well managed for quite some time and she is currently monitor closely with psychiatry.  She was educated regarding side effects with topiramate and advised to monitor mood very closely.  She will reach out to me with any concerns.  We will start a very low dose of 25 mg at bedtime.  May consider increasing to 50 mg if well tolerated.  She was advised to take this medication every day.  We will switch Ubrelvy to Nurtec.  I am hopeful that this may be effective in migraine abortive therapy.  Co-pay card given in the office.  She was encouraged to stay well-hydrated and focus on healthy lifestyle habits with well-balanced diet and regular exercise.  She will return to see me in the office in 3 to 6 months, sooner if needed.  She verbalizes understanding and agreement with this plan.   No orders  of the defined types were placed in this encounter.    No orders of the defined types were placed in this encounter.     Shawnie Dapper, FNP-C 02/08/2022, 12:49 PM [facility] [address], [address] [place], [place] [address] [phone]

## 2022-02-09 ENCOUNTER — Ambulatory Visit: Payer: BC Managed Care – PPO | Admitting: Family Medicine

## 2022-03-15 ENCOUNTER — Ambulatory Visit (HOSPITAL_COMMUNITY)
Admission: EM | Admit: 2022-03-15 | Discharge: 2022-03-15 | Disposition: A | Discharge status: Home / Self Care | Payer: BC Managed Care – PPO | Attending: Emergency Medicine | Admitting: Emergency Medicine

## 2022-03-15 ENCOUNTER — Encounter (HOSPITAL_COMMUNITY): Payer: Self-pay | Admitting: Emergency Medicine

## 2022-03-15 DIAGNOSIS — R531 Weakness: Secondary | ICD-10-CM | POA: Insufficient documentation

## 2022-03-15 DIAGNOSIS — R0981 Nasal congestion: Secondary | ICD-10-CM | POA: Insufficient documentation

## 2022-03-15 DIAGNOSIS — R350 Frequency of micturition: Secondary | ICD-10-CM | POA: Diagnosis not present

## 2022-03-15 DIAGNOSIS — R2689 Other abnormalities of gait and mobility: Secondary | ICD-10-CM | POA: Diagnosis not present

## 2022-03-15 DIAGNOSIS — J069 Acute upper respiratory infection, unspecified: Secondary | ICD-10-CM | POA: Diagnosis not present

## 2022-03-15 DIAGNOSIS — J4530 Mild persistent asthma, uncomplicated: Secondary | ICD-10-CM | POA: Insufficient documentation

## 2022-03-15 DIAGNOSIS — R5381 Other malaise: Secondary | ICD-10-CM | POA: Insufficient documentation

## 2022-03-15 DIAGNOSIS — Z1152 Encounter for screening for COVID-19: Secondary | ICD-10-CM | POA: Insufficient documentation

## 2022-03-15 DIAGNOSIS — N3000 Acute cystitis without hematuria: Secondary | ICD-10-CM | POA: Diagnosis not present

## 2022-03-15 DIAGNOSIS — G4710 Hypersomnia, unspecified: Secondary | ICD-10-CM

## 2022-03-15 DIAGNOSIS — R42 Dizziness and giddiness: Secondary | ICD-10-CM | POA: Insufficient documentation

## 2022-03-15 DIAGNOSIS — R339 Retention of urine, unspecified: Secondary | ICD-10-CM | POA: Diagnosis not present

## 2022-03-15 LAB — POCT URINALYSIS DIPSTICK, ED / UC
Bilirubin Urine: NEGATIVE
Glucose, UA: NEGATIVE mg/dL
Nitrite: POSITIVE — AB
Protein, ur: NEGATIVE mg/dL
Specific Gravity, Urine: 1.02 (ref 1.005–1.030)
Urobilinogen, UA: 0.2 mg/dL (ref 0.0–1.0)
pH: 7 (ref 5.0–8.0)

## 2022-03-15 MED ORDER — FLUCONAZOLE 150 MG PO TABS: 150.0000 mg | ORAL_TABLET | Freq: Every day | ORAL | 0 refills | Status: AC

## 2022-03-15 MED ORDER — NITROFURANTOIN MONOHYD MACRO 100 MG PO CAPS: 100.0000 mg | ORAL_CAPSULE | Freq: Two times a day (BID) | ORAL | 0 refills | Status: DC

## 2022-03-15 MED ORDER — AMOXICILLIN-POT CLAVULANATE 875-125 MG PO TABS: 1.0000 | ORAL_TABLET | Freq: Two times a day (BID) | ORAL | 0 refills | Status: DC

## 2022-03-15 NOTE — ED Provider Notes (Signed)
[place]    CSN: [id number] Arrival date & time: 03/15/22  1735      History   Chief Complaint Chief Complaint  Patient presents with   Cough    HPI Nicole [name] is a 46 y.o. female.   Patient presents with concerns of possible narcolepsy.  Endorses that she has had difficulty staying awake for her entire life, describes this as falling asleep within seconds and difficulty staying asleep throughout the night.  Typical bedtime schedule is falling asleep approximately around 8 to 9 PM, awakens approximately 1 to 2 AM, stays awake for 3 to 4 hours before falling back to sleep at 5 or 6 AM.  Falls asleep easily throughout the day and never feels rested regardless of amount of time slept.  Family member accompanying patient endorses she sometimes is difficult to wake up.  Has been taking prescribed doxepin for sleeping, use melatonin in the past but no current use.  Endorses intermittent dizziness described as feeling imbalance.  Family endorses changes in speech over the last 6 months but is having difficulties describing what the changes.  Endorses general malaise and weakness.  Patient endorses persistent nonproductive cough, nasal congestion, rhinorrhea lateral ear itching for 7 days.  No known sick contacts.  Tolerating food and liquids but endorses and increased appetite unrelated to illness.  Has not attempted treatment of symptoms.  History of asthma, seasonal allergies.  Denies shortness of breath or wheezing.  Patient endorses urinary frequency and incomplete bladder emptying for the last 3 days, endorses intermittent dribbling of urine for the last month.  Has not attempted treatment of symptoms.  Denies hematuria, dysuria, flank pain, abdominal pain or pressure, fever, chills, vaginal discharge, itching or odor.    Past Medical History:  Diagnosis Date   Abnormal pap    w LEEP   Alcoholism ([place])    Allergy    Asthma    Blood in stool    Depression (emotion)     Frequent headaches    Hepatitis C, chronic (HCC)    hep c tx successful   Irritable bowel syndrome    OSA on CPAP - sees Dr. Tami Ribas in ENT 10/09/2013   Seasonal allergies    scratch test in 2006///mold issue in house    Patient Active Problem List   Diagnosis Date Noted   Nonallergic rhinitis 08/23/2020   Mild persistent asthma without complication 21/19/4174   Other allergic rhinitis 06/14/2020   Adverse reaction to food, subsequent encounter 06/14/2020   IBS (irritable bowel syndrome) 05/24/2016   Chronic venous insufficiency 05/24/2016   Migraine 05/24/2016   Pulmonary nodules 10/27/2014   Hepatitis C, chronic ([place])    Depression 10/09/2013   OSA on CPAP - sees Dr. Tami Ribas in ENT 10/09/2013   Allergic rhinitis 05/30/2012   Cough variant asthma 02/08/2012    Past Surgical History:  Procedure Laterality Date   [place], 2001, 2002   x 3   LEEP  2008   NASAL FRACTURE SURGERY  1999   repair   NASAL SEPTOPLASTY W/ TURBINOPLASTY      OB History     Gravida  4   Para      Term      Preterm      AB  4   Living  0      SAB  1   IAB  3   Ectopic      Multiple  Live Births               Home Medications    Prior to Admission medications   Medication Sig Start Date End Date Taking? Authorizing Provider  ARIPiprazole (ABILIFY) 15 MG tablet Take 15 mg by mouth daily.    [provider]  azelastine (ASTELIN) 0.1 % nasal spray Place 1 spray into both nostrils 2 (two) times daily as needed (post nasal drip/runny nose). 08/23/20   Ellamae Sia, DO  ciclesonide (ALVESCO) 160 MCG/ACT inhaler Inhale 1 puff into the lungs 2 (two) times daily. with spacer and rinse mouth afterwards. 10/15/20   Ellamae Sia, DO  diphenoxylate-atropine (LOMOTIL) 2.5-0.025 MG tablet Take 1 tablet by mouth 3 (three) times daily as needed for constipation. 06/20/18   [provider]  doxepin (SINEQUAN) 10 MG capsule Take 10 mg by mouth at  bedtime as needed.    [provider]  FLUoxetine (PROZAC) 40 MG capsule Take 40 mg by mouth daily. 02/13/21   [provider]  fluticasone (FLONASE) 50 MCG/ACT nasal spray SPRAY TWO SPRAYS IN THE AFFECTED NOSTRIL DAILY 04/13/21   Koberlein, Jannette Spanner C, MD  HYDROcodone bit-homatropine (HYCODAN) 5-1.5 MG/5ML syrup Take 5 mLs by mouth every 6 (six) hours as needed for cough. 05/07/21   Rodriguez-Southworth, Nettie Elm, PA-C  levalbuterol [facility] HFA) 45 MCG/ACT inhaler Inhale 2 puffs into the lungs every 4 (four) hours as needed for wheezing or shortness of breath. 06/14/20   Ellamae Sia, DO  levonorgestrel (MIRENA) 20 MCG/24HR IUD 1 each by Intrauterine route once.    [provider]  Multiple Vitamin (MULTIVITAMINS PO) Take 1 tablet by mouth daily.     [provider]  ondansetron (ZOFRAN-ODT) 4 MG disintegrating tablet Take by mouth as needed. 11/16/20   [provider]  predniSONE (DELTASONE) 50 MG tablet One qd 05/07/21   Rodriguez-Southworth, Nettie Elm, PA-C  Rimegepant Sulfate (NURTEC) 75 MG TBDP Take 75 mg by mouth daily as needed (take for abortive therapy of migraine, no more than 1 tablet in 24 hours or 10 per month). 06/20/21   Lomax, Amy, NP  topiramate (TOPAMAX) 25 MG tablet Take 3 tablets (75 mg total) by mouth daily. 08/09/21   Huston Foley, MD    Family History Family History  Problem Relation Age of Onset   Emphysema Mother    Colon polyps Mother    Diverticulitis Mother    Heart disease Father    Cancer Father        unsure origin; had spot on lung   Hypertension Father    Hepatitis C Father    Alcohol abuse Father    Drug abuse Father    Stroke Maternal Grandmother    Osteoporosis Maternal Grandmother    Alcohol abuse Maternal Grandfather    Liver disease Maternal Grandfather    Migraines Neg Hx     Social History Social History   Tobacco Use   Smoking status: Former    Packs/day: 1.50    Years: 15.00    Total pack years: 22.50     Types: Cigarettes    Quit date: 05/15/2005    Years since quitting: 16.8   Smokeless tobacco: Never   Tobacco comments:    quit in 2007, smoked for 13 years   Vaping Use   Vaping Use: Never used  Substance Use Topics   Alcohol use: No    Alcohol/week: 0.0 standard drinks of alcohol    Comment: quit drinking on 2004---relapse  in 2007---QUIT 07/2005   Drug use: No    Comment: IV drug use///street drugs///inhalants  QUIT 07/2005     Allergies   Cashew nut oil, Other, Triptans, Lexapro [escitalopram oxalate], Cefaclor, and Nickel   Review of Systems Review of Systems Defer to HPI    Physical Exam Triage Vital Signs ED Triage Vitals  Enc Vitals Group     BP 03/15/22 1806 117/87     Pulse Rate 03/15/22 1806 85     Resp 03/15/22 1806 17     Temp 03/15/22 1806 98.5 F (36.9 C)     Temp src --      SpO2 03/15/22 1806 96 %     Weight --      Height --      Head Circumference --      Peak Flow --      Pain Score 03/15/22 1805 1     Pain Loc --      Pain Edu? --      Excl. in GC? --    No data found.  Updated Vital Signs BP 117/87 (BP Location: Right Arm)   Pulse 85   Temp 98.5 F (36.9 C)   Resp 17   SpO2 96%   Visual Acuity Right Eye Distance:   Left Eye Distance:   Bilateral Distance:    Right Eye Near:   Left Eye Near:    Bilateral Near:     Physical Exam Constitutional:      Appearance: Normal appearance.  HENT:     Right Ear: Tympanic membrane, ear canal and external ear normal.     Left Ear: Tympanic membrane, ear canal and external ear normal.     Nose: Congestion and rhinorrhea present.     Mouth/Throat:     Mouth: Mucous membranes are moist.     Pharynx: Oropharynx is clear.  Eyes:     Extraocular Movements: Extraocular movements intact.  Cardiovascular:     Rate and Rhythm: Normal rate and regular rhythm.     Pulses: Normal pulses.     Heart sounds: Normal heart sounds.  Pulmonary:     Effort: Pulmonary effort is normal.     Breath  sounds: Normal breath sounds.  Abdominal:     General: Abdomen is flat. Bowel sounds are normal.     Palpations: Abdomen is soft.     Tenderness: There is no right CVA tenderness or left CVA tenderness.  Skin:    General: Skin is warm and dry.  Neurological:     General: No focal deficit present.     Mental Status: She is alert and oriented to person, place, and time. Mental status is at baseline.     Cranial Nerves: No cranial nerve deficit.     Sensory: No sensory deficit.     Motor: No weakness.     Coordination: Coordination normal.     Gait: Gait normal.     Comments: Has fallen asleep twice during exam responding to voice   Psychiatric:        Mood and Affect: Mood normal.        Behavior: Behavior normal.      UC Treatments / Results  Labs (all labs ordered are listed, but only abnormal results are displayed) Labs Reviewed - No data to display  EKG   Radiology No results found.  Procedures Procedures (including critical care time)  Medications Ordered in UC Medications - No data to display  Initial Impression /  Assessment and Plan / UC Course  I have reviewed the triage vital signs and the nursing notes.  Pertinent labs & imaging results that were available during my care of the patient were reviewed by me and considered in my medical decision making (see chart for details).  Acute upper respiratory infection, acute cystitis without hematuria, difficulty staying awake   Vital signs are stable patient is in no signs of distress nor toxic appearing as symptoms have been present for months to years, defer to the emergency department, neurological exam is without abnormality, patient is established with neurologist, strongly advise follow-up within 1 to 2 weeks, given strict precautions that if any point if her symptoms worsen she is to go to the nearest emergency department  Signs are stable patient is in no signs of distress nor toxic appearing, low suspicion  for pneumonia or bronchitis therefore will defer x-ray imaging, will defer viral testing due to timeline of illness, as symptoms have been present for 7 days without signs of relief we will provide bacterial coverage, Augmentin 870 course prescribed, may use additional over-the-counter medications as needed for supportive care with follow-up with urgent care as needed   Analysis positive for nitrates and leukocytes, sent for culture, discussed with patient, Macrobid prescribed for treatment, may use over-the-counter Pyridium, analgesics, increase fluid intake and good hygiene for additional support with follow-up as needed Final Clinical Impressions(s) / UC Diagnoses   Final diagnoses:  None   Discharge Instructions   None    ED Prescriptions   None    PDMP not reviewed this encounter.   Valinda Hoar, [place] 03/16/22 [phone]

## 2022-03-15 NOTE — Discharge Instructions (Addendum)
For your concerns about possible nacrolepsy -At this time there are no abnormalities to your neurological exam -Please schedule a follow-up appointment with your neurologist for further evaluation -At any point if you believe that your symptoms are worsening please go to the nearest emergency department for further evaluation  For your urinary symptoms -Urinalysis today shows infection, begin use of Macrobid every morning and every evening for 5 days -You may wear feminine pads to help get urine from dribbling -You may practice Kegel exercises to help strengthen the pelvic floor which can sometimes help with incontinence, Kegels are the squeezing of the vaginal muscles, holding for 10 seconds then releasing, complete 10 times twice daily -You have been given information for the local urologist which is the bladder specialist for further evaluation as needed  If you begin to experience yeast symptoms take 1 tablet of Diflucan and then take second tablet after completion of antibiotics  For your cold and flu symptoms -Begin use of Augmentin every morning and every evening for 7 days as your symptoms have been present for a week without signs of improvement, initially were most likely caused by a virus -COVID test is pending, if positive you have already met 5 days CDC quarantine and may continue with activity wearing mask until symptoms resolve -Use any of the over-the-counter medications as needed for assistance

## 2022-03-15 NOTE — ED Triage Notes (Signed)
Pt having cough, congestion and today having some disorientation.  Last Wed had home covid test that was positive but had negative Covid at [facility] last Thursday.   Pt reports over lifetime having trouble staying awake, past 6-9 months gotten worse.  Doesn't currently have a PCP.

## 2022-03-16 LAB — SARS CORONAVIRUS 2 (TAT 6-24 HRS): SARS Coronavirus 2: NEGATIVE

## 2022-03-17 LAB — URINE CULTURE: Culture: >=100000 — AB

## 2022-04-06 ENCOUNTER — Other Ambulatory Visit: Payer: Self-pay | Admitting: Neurology

## 2022-04-06 DIAGNOSIS — G43109 Migraine with aura, not intractable, without status migrainosus: Secondary | ICD-10-CM

## 2022-04-13 ENCOUNTER — Other Ambulatory Visit: Payer: Self-pay | Admitting: *Deleted

## 2022-04-13 DIAGNOSIS — G43109 Migraine with aura, not intractable, without status migrainosus: Secondary | ICD-10-CM

## 2022-04-13 MED ORDER — TOPIRAMATE 25 MG PO TABS: 75.0000 mg | ORAL_TABLET | Freq: Every day | ORAL | 5 refills | Status: DC

## 2022-04-25 NOTE — Patient Instructions (Incomplete)
Please continue using your CPAP regularly. While your insurance requires that you use CPAP at least 4 hours each night on 70% of the nights, I recommend, that you not skip any nights and use it throughout the night if you can. Getting used to CPAP and staying with the treatment long term does take time and patience and discipline. Untreated obstructive sleep apnea when it is moderate to severe can have an adverse impact on cardiovascular health and raise her risk for heart disease, arrhythmias, hypertension, congestive heart failure, stroke and diabetes. Untreated obstructive sleep apnea causes sleep disruption, nonrestorative sleep, and sleep deprivation. This can have an impact on your day to day functioning and cause daytime sleepiness and impairment of cognitive function, memory loss, mood disturbance, and problems focussing. Using CPAP regularly can improve these symptoms.  Continue topiramate 75mg  at bedtime and Nurtec as needed. I will reorder HST to rule in or out sleep apnea. If HST is normal, we will get you in with Dr for consideration of additional testing. I will refer you to a primary care provider to establish care.   Follow up pending sleep study
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
## 2022-04-27 ENCOUNTER — Encounter: Payer: Self-pay | Admitting: Family Medicine

## 2022-04-27 ENCOUNTER — Ambulatory Visit: Payer: BC Managed Care – PPO | Admitting: Family Medicine

## 2022-04-27 VITALS — BP 97/60 | HR 94 | Ht 60.0 in | Wt 116.0 lb

## 2022-04-27 DIAGNOSIS — G4733 Obstructive sleep apnea (adult) (pediatric): Secondary | ICD-10-CM | POA: Diagnosis not present

## 2022-04-27 DIAGNOSIS — Z758 Other problems related to medical facilities and other health care: Secondary | ICD-10-CM | POA: Diagnosis not present

## 2022-05-01 ENCOUNTER — Telehealth: Payer: Self-pay | Admitting: Family Medicine

## 2022-05-01 NOTE — Telephone Encounter (Signed)
Referral for Family Practice fax to [facility]. Phone: [phone], Fax: [phone]

## 2022-05-01 NOTE — Telephone Encounter (Signed)
Called pt to schedule a New Patient appt to Establish Care at [facility]. No answer, LVM to call back at [phone] to schedule this.

## 2022-05-03 ENCOUNTER — Other Ambulatory Visit (HOSPITAL_COMMUNITY): Payer: Self-pay

## 2022-05-03 ENCOUNTER — Emergency Department (HOSPITAL_COMMUNITY): Payer: BC Managed Care – PPO

## 2022-05-03 ENCOUNTER — Emergency Department (HOSPITAL_COMMUNITY)
Admission: EM | Admit: 2022-05-03 | Discharge: 2022-05-03 | Disposition: A | Discharge status: Home / Self Care | Payer: BC Managed Care – PPO | Attending: Emergency Medicine | Admitting: Emergency Medicine

## 2022-05-03 ENCOUNTER — Encounter (HOSPITAL_COMMUNITY): Payer: Self-pay | Admitting: Emergency Medicine

## 2022-05-03 DIAGNOSIS — S32039A Unspecified fracture of third lumbar vertebra, initial encounter for closed fracture: Secondary | ICD-10-CM | POA: Diagnosis not present

## 2022-05-03 DIAGNOSIS — S22059A Unspecified fracture of T5-T6 vertebra, initial encounter for closed fracture: Secondary | ICD-10-CM | POA: Diagnosis not present

## 2022-05-03 DIAGNOSIS — S22069A Unspecified fracture of T7-T8 vertebra, initial encounter for closed fracture: Secondary | ICD-10-CM | POA: Diagnosis not present

## 2022-05-03 DIAGNOSIS — N9489 Other specified conditions associated with female genital organs and menstrual cycle: Secondary | ICD-10-CM | POA: Insufficient documentation

## 2022-05-03 DIAGNOSIS — X58XXXA Exposure to other specified factors, initial encounter: Secondary | ICD-10-CM | POA: Diagnosis not present

## 2022-05-03 DIAGNOSIS — T7421XA Adult sexual abuse, confirmed, initial encounter: Secondary | ICD-10-CM | POA: Diagnosis not present

## 2022-05-03 DIAGNOSIS — S32059A Unspecified fracture of fifth lumbar vertebra, initial encounter for closed fracture: Secondary | ICD-10-CM | POA: Insufficient documentation

## 2022-05-03 DIAGNOSIS — S32009A Unspecified fracture of unspecified lumbar vertebra, initial encounter for closed fracture: Secondary | ICD-10-CM

## 2022-05-03 DIAGNOSIS — S22009A Unspecified fracture of unspecified thoracic vertebra, initial encounter for closed fracture: Secondary | ICD-10-CM

## 2022-05-03 LAB — COMPREHENSIVE METABOLIC PANEL
ALT: 22 U/L (ref 0–44)
AST: 33 U/L (ref 15–41)
Albumin: 3.8 g/dL (ref 3.5–5.0)
Alkaline Phosphatase: 36 U/L — ABNORMAL LOW (ref 38–126)
Anion gap: 7 (ref 5–15)
BUN: 8 mg/dL (ref 6–20)
CO2: 24 mmol/L (ref 22–32)
Calcium: 8.8 mg/dL — ABNORMAL LOW (ref 8.9–10.3)
Chloride: 109 mmol/L (ref 98–111)
Creatinine, Ser: 1 mg/dL (ref 0.44–1.00)
GFR, Estimated: >60 mL/min (ref >60)
Glucose, Bld: 65 mg/dL — ABNORMAL LOW (ref 70–99)
Potassium: 3.5 mmol/L (ref 3.5–5.1)
Sodium: 140 mmol/L (ref 135–145)
Total Bilirubin: 0.8 mg/dL (ref 0.3–1.2)
Total Protein: 6.9 g/dL (ref 6.5–8.1)

## 2022-05-03 LAB — CBC
HCT: 45.3 % (ref 36.0–46.0)
Hemoglobin: 15.7 g/dL — ABNORMAL HIGH (ref 12.0–15.0)
MCH: 33.2 pg (ref 26.0–34.0)
MCHC: 34.7 g/dL (ref 30.0–36.0)
MCV: 95.8 fL (ref 80.0–100.0)
Platelets: 256 10*3/uL (ref 150–400)
RBC: 4.73 MIL/uL (ref 3.87–5.11)
RDW: 12.2 % (ref 11.5–15.5)
WBC: 10.3 10*3/uL (ref 4.0–10.5)
nRBC: 0 % (ref 0.0–0.2)

## 2022-05-03 LAB — RPR
RPR Ser Ql: REACTIVE — AB
RPR Titer: 1:1 {titer}

## 2022-05-03 LAB — I-STAT BETA HCG BLOOD, ED (MC, WL, AP ONLY): I-stat hCG, quantitative: <5 m[IU]/mL (ref <5)

## 2022-05-03 LAB — RAPID HIV SCREEN (HIV 1/2 AB+AG)
HIV 1/2 Antibodies: NONREACTIVE
HIV-1 P24 Antigen - HIV24: NONREACTIVE

## 2022-05-03 LAB — HEPATITIS C ANTIBODY: HCV Ab: REACTIVE — AB

## 2022-05-03 LAB — HEPATITIS B SURFACE ANTIGEN: Hepatitis B Surface Ag: NONREACTIVE

## 2022-05-03 MED ORDER — METRONIDAZOLE 500 MG PO TABS
2000.0000 mg | ORAL_TABLET | Freq: Once | ORAL | Status: AC
  Administered 2022-05-03: 2000 mg via ORAL

## 2022-05-03 MED ORDER — PROMETHAZINE HCL 25 MG PO TABS
25.0000 mg | ORAL_TABLET | Freq: Four times a day (QID) | ORAL | Status: DC | PRN
  Administered 2022-05-03: 25 mg via ORAL

## 2022-05-03 MED ORDER — PENICILLIN G BENZATHINE 1200000 UNIT/2ML IM SUSY
2.4000 10*6.[IU] | PREFILLED_SYRINGE | Freq: Once | INTRAMUSCULAR | Status: AC
  Administered 2022-05-03: 2.4 10*6.[IU] via INTRAMUSCULAR
  Filled 2022-05-03: qty 4

## 2022-05-03 MED ORDER — ELVITEG-COBIC-EMTRICIT-TENOFAF 150-150-200-10 MG PREPACK
ORAL_TABLET | ORAL | Status: AC
  Filled 2022-05-03: qty 1

## 2022-05-03 MED ORDER — IOHEXOL 350 MG/ML SOLN
75.0000 mL | Freq: Once | INTRAVENOUS | Status: AC | PRN
  Administered 2022-05-03: 75 mL via INTRAVENOUS

## 2022-05-03 MED ORDER — HYDROCODONE-ACETAMINOPHEN 5-325 MG PO TABS
1.0000 | ORAL_TABLET | Freq: Once | ORAL | Status: AC
  Administered 2022-05-03: 1 via ORAL
  Filled 2022-05-03: qty 1

## 2022-05-03 MED ORDER — ELVITEG-COBIC-EMTRICIT-TENOFAF 150-150-200-10 MG PO TABS
1.0000 | ORAL_TABLET | Freq: Every day | ORAL | 0 refills | Status: DC
  Filled 2022-05-03: qty 30, 30d supply, fill #0

## 2022-05-03 MED ORDER — CEFTRIAXONE SODIUM 500 MG IJ SOLR
500.0000 mg | Freq: Once | INTRAMUSCULAR | Status: AC
  Administered 2022-05-03: 500 mg via INTRAMUSCULAR

## 2022-05-03 MED ORDER — LIDOCAINE HCL (PF) 1 % IJ SOLN
1.0000 mL | Freq: Once | INTRAMUSCULAR | Status: AC
  Administered 2022-05-03: 2 mL

## 2022-05-03 MED ORDER — AZITHROMYCIN 250 MG PO TABS
1000.0000 mg | ORAL_TABLET | Freq: Once | ORAL | Status: AC
  Administered 2022-05-03: 1000 mg via ORAL

## 2022-05-03 MED ORDER — OXYCODONE-ACETAMINOPHEN 5-325 MG PO TABS
1.0000 | ORAL_TABLET | Freq: Once | ORAL | Status: AC
  Administered 2022-05-03: 1 via ORAL
  Filled 2022-05-03: qty 1

## 2022-05-03 MED ORDER — HYDROCODONE-ACETAMINOPHEN 5-325 MG PO TABS
1.0000 | ORAL_TABLET | Freq: Four times a day (QID) | ORAL | 0 refills | Status: DC | PRN
  Filled 2022-05-03: qty 10, 3d supply, fill #0

## 2022-05-03 MED ORDER — ELVITEG-COBIC-EMTRICIT-TENOFAF 150-150-200-10 MG PREPACK
1.0000 | ORAL_TABLET | Freq: Once | ORAL | Status: AC
  Administered 2022-05-03: 1 via ORAL
  Filled 2022-05-03: qty 1
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
## 2022-05-03 NOTE — SANE Note (Signed)
[employer] Coupon Copay:  RxBIN: [id number] RxPCN: ACCESS RxGRP: [id number] Issuer: [phone]) ID: [id number]
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
## 2022-05-03 NOTE — Progress Notes (Signed)
Orthopedic Tech Progress Note Patient Details:  Nicole [name] [birth year] [id number] TLSO was sized to this patient and she stated that she did not want the brace applied at this time.  Ortho Devices Type of Ortho Device: Thoracolumbar corset (TLSO) Ortho Device/Splint Interventions: Ordered      [name] 05/03/2022, 7:14 PM
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
## 2022-05-03 NOTE — Discharge Instructions (Signed)
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 supplements you use. Also tell them if you smoke, drink alcohol, or use illegal drugs. Some items may interact with your medicine. What should I watch for while using this medication? Tell your care team if your symptoms do not start to get better or if they get worse. This medication may cause serious skin reactions. They can happen weeks to months after starting the medication. Contact your care team right away if you notice fevers or flu-like symptoms with a rash. The rash may be red or purple and then turn into blisters or peeling of the skin. Or, you might notice a red rash with  swelling of the face, lips or lymph nodes in your neck or under your arms. Do not treat diarrhea with over the counter products. Contact your care team if you have diarrhea that lasts more than 2 days or if it is severe and watery. This medication can make you more sensitive to the sun. Keep out of the sun. If you cannot avoid being in the sun, wear protective clothing and use sunscreen. Do not use sun  lamps or tanning beds/booths. What side effects may I notice from receiving this medication? Side effects that you should report to your care team as soon as possible: Allergic reactions or angioedema-skin rash, itching, hives, swelling of the face, eyes, lips, tongue, arms, or legs, trouble swallowing or breathing Heart rhythm changes-fast or irregular heartbeat, dizziness, feeling faint or lightheaded, chest pain, trouble breathing Liver injury-right upper belly pain, loss of appetite, nausea, light-colored stool, dark yellow or brown urine, yellowing skin or eyes, unusual weakness or fatigue Rash, fever, and swollen lymph nodes Redness, blistering, peeling, or loosening of the skin, including inside the mouth Severe diarrhea, fever Unusual vaginal discharge, itching, or odor Side effects that usually do not require medical attention (report to your care team if they continue or are bothersome): Diarrhea Nausea Stomach pain Vomiting This list may not describe all possible side effects. Call your doctor for medical advice about side effects. You may report side effects to FDA at 1-800-FDA-1088. Where should I keep my medication? Keep out of the reach of children and pets. Store at room temperature between 15 and 30 degrees C (59 and 86 degrees F). Throw away any unused medication after the expiration date. NOTE: This sheet is a summary. It may not cover all possible information. If you have questions about this medicine, talk to your doctor, pharmacist, or health care provider.  2022 Elsevier/Gold Standard (2020-03-24 11:19:31)       Metronidazole (4 pills at once) Also known as:  Flagyl   Metronidazole Capsules or Tablets What is this medication? METRONIDAZOLE (me troe NI da zole) treats infections caused by bacteria or parasites. It belongs to a group of medications called antibiotics. It will not treat colds, the flu, or infections caused by viruses. This medicine may be used for  other purposes; ask your health care provider or pharmacist if you have questions. COMMON BRAND NAME(S): Flagyl What should I tell my care team before I take this medication? They need to know if you have any of these conditions: Cockayne syndrome History of blood diseases such as sickle cell anemia, anemia, or leukemia If you often drink alcohol Irregular heartbeat or rhythm Kidney disease Liver disease Yeast or fungal infection An unusual or allergic reaction to metronidazole, nitroimidazoles, or other medications, foods, dyes, or preservatives Pregnant or trying to get pregnant Breast-feeding How should I use this medication? Take this medication by mouth with water. Take it as directed on the prescription label at the same time every day. Take all of this medication unless your care team tells you to stop it early. Keep taking it even if you think you are better. Talk to your care team about the use of this medication in children. While it may be prescribed for children for selected conditions, precautions do apply. Overdosage: If you think you have taken too much of this medicine contact a poison control center or emergency room at once. NOTE: This medicine is only for you. Do not share this medicine with others. What if I miss a dose?  If you miss a dose, take it as soon as you can. If it is almost time for your next dose, take only that dose. Do not take double or extra doses. What may interact with this medication? Do not take this medication with any of the following: Alcohol or any product that contains alcohol Cisapride Disulfiram Dronedarone Pimozide Thioridazine This medication may also interact with the following: Birth control pills Busulfan Carbamazepine Certain medications that treat or prevent blood clots like warfarin Cimetidine Lithium Other medications that prolong the QT interval (cause an abnormal heart rhythm) Phenobarbital Phenytoin This list may not  describe all possible interactions. Give your health care provider a list of all the medicines, herbs, non-prescription drugs, or dietary supplements you use. Also tell them if you smoke, drink alcohol, or use illegal drugs. Some items may interact with your medicine. What should I watch for while using this medication? Tell your care team if your symptoms do not start to get better or if they get worse. Some products may contain alcohol. Ask your care team if this medication contains alcohol. Be sure to tell all care teams you are taking this medication. Certain medications, such as metronidazole and disulfiram, can cause an unpleasant reaction when taken with alcohol. The reaction includes flushing, headache, nausea, vomiting, sweating, and increased thirst. The reaction can last from 30 minutes to several hours. If you are being treated for a sexually transmitted disease (STD), avoid sexual contact until you have finished your treatment. Your sexual partner may also need treatment. Birth control may not work properly while you are taking this medication. Talk to your care team about using an extra method of birth control. What side effects may I notice from receiving this medication? Side effects that you should report to your care team as soon as possible: Allergic reactions-skin rash, itching, hives, swelling of the face, lips, tongue, or throat Dizziness, loss of balance or coordination, confusion or trouble speaking Fever, neck pain or stiffness, sensitivity to light, headache, nausea, vomiting, confusion Heart rhythm changes-fast or irregular heartbeat, dizziness, feeling faint or lightheaded, chest pain, trouble breathing Liver injury-right upper belly pain, loss of appetite, nausea, light-colored stool, dark yellow or brown urine, yellowing skin or eyes, unusual weakness or fatigue Pain, tingling, or numbness in the hands or feet Redness, blistering, peeling, or loosening of the skin,  including inside the mouth Seizures Severe diarrhea, fever Sudden eye pain or change in vision such as blurry vision, seeing halos around lights, vision loss Unusual vaginal discharge, itching, or odor Side effects that usually do not require medical attention (report to your care team if they continue or are bothersome): Diarrhea Metallic taste in mouth Nausea Stomach pain This list may not describe all possible side effects. Call your doctor for medical advice about side effects. You may report side effects to FDA at 1-800-FDA-1088. Where should I keep my medication? Keep out of the reach of children and pets. Store between 15 and 25 degrees C (59 and 77 degrees F). Protect from light. Get rid of any unused medication after the expiration date. To get rid of medications that are no longer needed or have expired: Take the medication to a medication take-back program. Check with your pharmacy or law enforcement to find a location. If you cannot return the medication, check the label or package insert to see if the medication should be thrown out in the garbage or flushed down the toilet. If you are not sure, ask your care team.  If it is safe to put it in the trash, take the medication out of the container. Mix the medication with cat litter, dirt, coffee grounds, or other unwanted substance. Seal the mixture in a bag or container. Put it in the trash. NOTE: This sheet is a summary. It may not cover all possible information. If you have questions about this medicine, talk to your doctor, pharmacist, or health care provider.  2022 Elsevier/Gold Standard (2020-06-24 13:29:17)  Elvitegravir; Cobicistat; Emtricitabine; Tenofovir Alafenamide oral tablets  What is this medication? ELVITEGRAVIR; COBICISTAT; EMTRICITABINE; TENOFOVIR ALAFENAMIDE (el vye TEG ra veer; koe BIS i stat; em tri SIT uh bean; te NOE fo veer) is 3 antiretroviral medicines and a medication booster in 1 tablet. It is used to  treat HIV. This medicine is not a cure for HIV. This medicine can lower, but not fully prevent, the risk of spreading HIV to others. This medicine may be used for other purposes; ask your health care provider or pharmacist if you have questions. COMMON BRAND NAME(S): Genvoya What should I tell my care team before I take this medication? They need to know if you have any of these conditions: kidney disease liver disease an unusual or allergic reaction to elvitegravir, cobicistat, emtricitabine, tenofovir, other medicines, foods, dyes, or preservatives pregnant or trying to get pregnant breast-feeding How should I use this medication? Take this medicine by mouth with a glass of water. Follow the directions on the prescription label. Take this medicine with food. Take your medicine at regular intervals. Do not take your medicine more often than directed. For your anti-HIV therapy to work as well as possible, take each dose exactly as prescribed. Do not skip doses or stop your medicine even if you feel better. Skipping doses may make the HIV virus resistant to this medicine and other medicines. Do not stop taking except on your doctor's advice. Talk to your pediatrician regarding the use of this medicine in children. While this drug may be prescribed for selected conditions, precautions do apply. Overdosage: If you think you have taken too much of this medicine contact a poison control center or emergency room at once. NOTE: This medicine is only for you. Do not share this medicine with others. What if I miss a dose? If you miss a dose, take it as soon as you can. If it is almost time for your next dose, take only that dose. Do not take double or extra doses. What may interact with this medication? Do not take this medicine with any of the following medications: adefovir alfuzosin certain medicines for seizures like carbamazepine, phenobarbital, phenytoin cisapride irinotecan lumacaftor;  ivacaftor lurasidone medicines for cholesterol like lovastatin, simvastatin medicines for headaches like dihydroergotamine, ergotamine, methylergonovine midazolam naloxegol other antiviral medicines for HIV or AIDS pimozide rifampin sildenafil [facility]. John's wort triazolam This medicine may also interact with the following medications: antacids atorvastatin bosentan buprenorphine; naloxone certain antibiotics like clarithromycin, telithromycin, rifabutin, rifapentine certain medications for anxiety or sleep like buspirone, clorazepate, diazepam, estazolam, flurazepam, zolpidem certain medicines for blood pressure or heart disease like amlodipine, diltiazem, felodipine, metoprolol, nicardipine, nifedipine, timolol, verapamil certain medicines for depression, anxiety, or psychiatric disturbances certain medicines for erectile dysfunction like avanafil, sildenafil, tadalafil, vardenafil certain medicines for fungal infection like itraconazole, ketoconazole, voriconazole certain medicines that treat or prevent blood clots like warfarin, apixaban, betrixaban, dabigatran, edoxaban, and rivaroxaban colchicine cyclosporine female hormones, like estrogens and progestins and birth control pills medicines for infection like acyclovir, cidofovir, valacyclovir, ganciclovir, valganciclovir medicines for  irregular heart beat like amiodarone, bepridil, digoxin, disopyramide, dofetilide, flecainide, lidocaine, mexiletine, propafenone, quinidine metformin oxcarbazepine phenothiazines like perphenazine, risperidone, thioridazine salmeterol sirolimus steroid medicines like betamethasone, budesonide, ciclesonide, dexamethasone, fluticasone, methylprednisolone, mometasone, triamcinolone tacrolimus This list may not describe all possible interactions. Give your health care provider a list of all the medicines, herbs, non-prescription drugs, or dietary supplements you use. Also tell them if you smoke, drink  alcohol, or use illegal drugs. Some items may interact with your medicine. What should I watch for while using this medication? Visit your doctor or health care professional for regular check ups. Discuss any new symptoms with your doctor. You will need to have important blood work done while on this medicine. HIV is spread to others through sexual or blood contact. Talk to your doctor about how to stop the spread of HIV. If you have hepatitis B, talk to your doctor if you plan to stop this medicine. The symptoms of hepatitis B may get worse if you stop this medicine. Birth control pills may not work properly while you are taking this medicine. Talk to your doctor about using an extra method of birth control. Women who can still have children must use a reliable form of barrier contraception, like a condom. What side effects may I notice from receiving this medication? Side effects that you should report to your doctor or health care professional as soon as possible: allergic reactions like skin rash, itching or hives, swelling of the face, lips, or tongue breathing problems fast, irregular heartbeat muscle pain or weakness signs and symptoms of kidney injury like trouble passing urine or change in the amount of urine signs and symptoms of liver injury like dark yellow or brown urine; general ill feeling or flu-like symptoms; light-colored stools; loss of appetite; right upper belly pain; unusually weak or tired; yellowing of the eyes or skin Side effects that usually do not require medical attention (report to your doctor or health care professional if they continue or are bothersome): diarrhea headache nausea tiredness This list may not describe all possible side effects. Call your doctor for medical advice about side effects. You may report side effects to FDA at 1-800-FDA-1088. Where should I keep my medication? Keep out of the reach of children. Store at room temperature below 30 degrees C  (86 degrees F). Throw away any unused medicine after the expiration date. NOTE: This sheet is a summary. It may not cover all possible information. If you have questions about this medicine, talk to your doctor, pharmacist, or health care provider.  2022 Elsevier/Gold Standard (2019-04-01 17:54:51)   Promethazine (pack of 3 for home use) Also known as:  Phenergan  Promethazine Tablets  What is this medication? PROMETHAZINE (proe METH a zeen) prevents and treats the symptoms of an allergic reaction. It works by blocking histamine, a substance released by the body during an allergic reaction. It may also help you relax, go to sleep, and relieve nausea, vomiting, or pain before or after procedures. It can also prevent and treat motion sickness. It works by helping your nervous system calm down by blocking substances in the body that may cause nausea and vomiting. It belongs to a group of medications called antihistamines. This medicine may be used for other purposes; ask your health care provider or pharmacist if you have questions. COMMON BRAND NAME(S): Phenergan What should I tell my care team before I take this medication? They need to know if you have any of these conditions: Blockage in your  bowel Diabetes Glaucoma Have trouble controlling your muscles Heart disease Liver disease Low blood counts, like low white cell, platelet, or red cell counts Lung or breathing disease, like asthma Parkinson's disease Prostate disease Seizures Stomach or intestine problems Trouble passing urine An unusual or allergic reaction to promethazine, sulfites, other medications, foods, dyes, or preservatives Pregnant or trying to get pregnant Breast-feeding How should I use this medication? Take this medication by mouth with a glass of water. Follow the directions on the prescription label. Take your doses at regular intervals. Do not take your medication more often than directed. Talk to your care  team about the use of this medication in children. Special care may be needed. This medication should not be given to infants and children younger than 19 years old. Overdosage: If you think you have taken too much of this medicine contact a poison control center or emergency room at once. NOTE: This medicine is only for you. Do not share this medicine with others. What if I miss a dose? If you miss a dose, take it as soon as you can. If it is almost time for your next dose, take only that dose. Do not take double or extra doses. What may interact with this medication? Alcohol Antihistamines for allergy, cough, and cold Atropine Certain medications for anxiety or sleep Certain medications for bladder problems like oxybutynin, tolterodine Certain medications for depression like amitriptyline, fluoxetine, sertraline Certain medications for Parkinson's disease like benztropine, trihexyphenidyl Certain medications for stomach problems like dicyclomine, hyoscyamine Certain medications for travel sickness like scopolamine Epinephrine General anesthetics like halothane, isoflurane, methoxyflurane, propofol Ipratropium MAOIs like Marplan, Nardil, and Parnate Medications for high blood pressure Medications for seizures like phenobarbital, primidone, phenytoin Medications that relax muscles for surgery Metoclopramide Narcotic medications for pain This list may not describe all possible interactions. Give your health care provider a list of all the medicines, herbs, non-prescription drugs, or dietary supplements you use. Also tell them if you smoke, drink alcohol, or use illegal drugs. Some items may interact with your medicine. What should I watch for while using this medication? Visit your care team for regular checks on your progress. Tell your care team if symptoms do not start to get better or if they get worse. You may get drowsy or dizzy. Do not drive, use machinery, or do anything that needs  mental alertness until you know how this medication affects you. To reduce the risk of dizzy or fainting spells, do not stand or sit up quickly, especially if you are an older patient. Alcohol may increase dizziness and drowsiness. Avoid alcoholic drinks. Your mouth may get dry. Chewing sugarless gum or sucking hard candy, and drinking plenty of water may help. Contact your care team if the problem does not go away or is severe. This medication may cause dry eyes and blurred vision. If you wear contact lenses you may feel some discomfort. Lubricating drops may help. See your care team if the problem does not go away or is severe. This medication can make you more sensitive to the sun. Keep out of the sun. If you cannot avoid being in the sun, wear protective clothing and use sunscreen. Do not use sun lamps or tanning beds/booths. This medication may increase blood sugar. Ask your care team if changes in diet or medications are needed if you have diabetes. What side effects may I notice from receiving this medication? Side effects that you should report to your care team as soon as possible:  Allergic reactions-skin rash, itching, hives, swelling of the face, lips, tongue, or throat CNS depression-slow or shallow breathing, shortness of breath, feeling faint, dizziness, confusion, trouble staying awake High fever stiff muscles, increased sweating, fast or irregular heartbeat, and confusion, which may be signs of neuroleptic malignant syndrome Infection-fever, chills, cough, or sore throat Liver injury-right upper belly pain, loss of appetite, nausea, light-colored stool, dark yellow or brown urine, yellowing skin or eyes, unusual weakness or fatigue Seizures Sudden eye pain or change in vision such as blurry vision, seeing halos around lights, vision loss Trouble passing urine Uncontrolled and repetitive body movements, muscle stiffness or spasms, tremors or shaking, loss of balance or coordination,  restlessness, shuffling walk, which may be signs of extrapyramidal symptoms (EPS) Side effects that usually do not require medical attention (report to your care team if they continue or are bothersome): Confusion Constipation Dizziness Drowsiness Dry mouth Sensitivity to light Vivid dreams or nightmares This list may not describe all possible side effects. Call your doctor for medical advice about side effects. You may report side effects to FDA at 1-800-FDA-1088. Where should I keep my medication? Keep out of the reach of children. Store at room temperature, between 20 and 25 degrees C (68 and 77 degrees F). Protect from light. Throw away any unused medication after the expiration date. NOTE: This sheet is a summary. It may not cover all possible information. If you have questions about this medicine, talk to your doctor, pharmacist, or health care provider.  2022 Elsevier/Gold Standard (2020-08-10 10:57:27)

## 2022-05-04 ENCOUNTER — Other Ambulatory Visit (HOSPITAL_COMMUNITY): Payer: Self-pay

## 2022-05-04 LAB — T.PALLIDUM AB, TOTAL: T Pallidum Abs: NONREACTIVE

## 2022-05-24 ENCOUNTER — Telehealth: Payer: Self-pay | Admitting: Family Medicine

## 2022-05-24 NOTE — Telephone Encounter (Signed)
[employer] Josem Kaufmann: [id number] (exp. 05/22/22 to 07/20/22)   left VM 05/23/22 [place]

## 2022-05-30 NOTE — Telephone Encounter (Signed)
LVM for pt to call back to schedule sleep study.   We have attempted to call the patient two times to schedule sleep study.  Patient has been unavailable at the phone numbers we have on file and has not returned our calls.  If patient calls back we will schedule them for their sleep study.  

## 2022-07-18 ENCOUNTER — Other Ambulatory Visit: Payer: Self-pay | Admitting: *Deleted

## 2022-07-18 DIAGNOSIS — G43109 Migraine with aura, not intractable, without status migrainosus: Secondary | ICD-10-CM

## 2022-07-18 DIAGNOSIS — G4733 Obstructive sleep apnea (adult) (pediatric): Secondary | ICD-10-CM

## 2022-07-18 MED ORDER — NURTEC 75 MG PO TBDP: 75.0000 mg | ORAL_TABLET | Freq: Every day | ORAL | 2 refills | Status: DC | PRN

## 2022-07-24 ENCOUNTER — Ambulatory Visit: Payer: Medicaid Other | Admitting: Family Medicine

## 2022-08-09 ENCOUNTER — Other Ambulatory Visit: Payer: Self-pay | Admitting: Physician Assistant

## 2022-08-09 ENCOUNTER — Other Ambulatory Visit: Payer: Self-pay | Admitting: *Deleted

## 2022-08-09 DIAGNOSIS — N6310 Unspecified lump in the right breast, unspecified quadrant: Secondary | ICD-10-CM

## 2022-08-10 ENCOUNTER — Other Ambulatory Visit: Payer: BC Managed Care – PPO

## 2022-09-14 ENCOUNTER — Telehealth: Payer: Self-pay | Admitting: Family Medicine

## 2022-09-14 DIAGNOSIS — G43109 Migraine with aura, not intractable, without status migrainosus: Secondary | ICD-10-CM

## 2022-09-14 MED ORDER — PREDNISONE 10 MG (21) PO TBPK: ORAL_TABLET | ORAL | 0 refills | Status: DC

## 2022-09-14 NOTE — Telephone Encounter (Signed)
Received fax from Karin Golden requesting PA Nurtec. Key: [id number]. Tried submitting. Received the following message from [facility] Medicaid : "The patient is not found with the information provided - Member Termed"   Pt uploaded new insurance card. I started new PA request on covermymeds with info from this card. Key: [id number]. Received same response: "The patient is not found with the information provided - Member Termed"

## 2022-09-14 NOTE — Telephone Encounter (Signed)
I called provider service phone# below. Spoke w/ Raoul Pitch w/ [employer]. She confirmed pt plan termed on 09/11/22. Pt has to call member services first before PA can be completed. I called pt back. Relayed this info. She will call member services to find out what has happened and will call us back with an update.  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

## 2022-09-14 NOTE — Telephone Encounter (Signed)
Pt states that thru Medicaid a PA has been requested already for her  Rimegepant Sulfate (NURTEC) 75 MG TBDP , pt states she is out of her medication.  Pt is asking to be called on any suggestions while waiting for that to be approved.  Pt is experiencing a horrible migraine and would like a call from RN

## 2022-09-14 NOTE — Telephone Encounter (Signed)
Called pt. Informed her of message [facility] sent. Pt stated her insurance is active with [employer] and the name they have is Nicole [name] with no space. Pt said I just picked up medication last night so I know it's active.

## 2022-09-14 NOTE — Telephone Encounter (Addendum)
Called pt. She will upload front/back of new insurance card to [employer]. States Karin Golden sent PA request. Advised we have not received. She will also ask them to re-fax request to [place] at [phone].  Advised we currently do not have any Nurtec samples to provide.   Offered 2 options per AL,NP to help w/ current migraine: coming to office for toradol inj or prednisone taper pack. Pt preferred taper pack. She was currently at [facility]. She confirmed she has no personal hx of diabetes. E-scribed rx to [employer].   FYI- pt states toradol inj have been beneficial in the past

## 2022-09-20 ENCOUNTER — Encounter (HOSPITAL_COMMUNITY): Payer: Self-pay

## 2022-09-20 ENCOUNTER — Ambulatory Visit (HOSPITAL_COMMUNITY)
Admission: EM | Admit: 2022-09-20 | Discharge: 2022-09-20 | Disposition: A | Discharge status: Home / Self Care | Payer: Medicaid Other | Attending: Emergency Medicine | Admitting: Emergency Medicine

## 2022-09-20 DIAGNOSIS — J019 Acute sinusitis, unspecified: Secondary | ICD-10-CM | POA: Diagnosis not present

## 2022-09-20 DIAGNOSIS — R300 Dysuria: Secondary | ICD-10-CM | POA: Diagnosis not present

## 2022-09-20 DIAGNOSIS — B9689 Other specified bacterial agents as the cause of diseases classified elsewhere: Secondary | ICD-10-CM

## 2022-09-20 LAB — POCT URINALYSIS DIP (MANUAL ENTRY)
Bilirubin, UA: NEGATIVE
Blood, UA: NEGATIVE
Glucose, UA: NEGATIVE mg/dL
Ketones, POC UA: NEGATIVE mg/dL
Nitrite, UA: NEGATIVE
Protein Ur, POC: NEGATIVE mg/dL
Spec Grav, UA: >=1.03 — AB (ref 1.010–1.025)
Urobilinogen, UA: 0.2 E.U./dL
pH, UA: 5.5 (ref 5.0–8.0)

## 2022-09-20 MED ORDER — AMOXICILLIN-POT CLAVULANATE 875-125 MG PO TABS: 1.0000 | ORAL_TABLET | Freq: Two times a day (BID) | ORAL | 0 refills | Status: DC

## 2022-09-20 MED ORDER — PROMETHAZINE-DM 6.25-15 MG/5ML PO SYRP: 5.0000 mL | ORAL_SOLUTION | Freq: Four times a day (QID) | ORAL | 0 refills | Status: DC | PRN

## 2022-09-20 NOTE — Discharge Instructions (Addendum)
Your symptoms are consistent with a bacterial sinus infection, please take all antibiotics as prescribed and until finished.  You can take them with food to help prevent gastrointestinal upset.  I also suggest Mucinex 1200 mg daily, as well as 64 ounces of water to help loosen up your secretions.  You can sleep with a humidifier as well.  You can take the cough syrup as needed at night, do not drink or drive on this medication as it may make you drowsy.  Your urine was without obvious signs of infection, I am sending this for culture and I will contact you if the results are positive and antibiotics are indicated.  Please ensure you are drinking plenty of water, avoid sodas and juices as these are urinary irritants.  Please return to clinic for any new or concerning symptoms.

## 2022-09-20 NOTE — ED Triage Notes (Signed)
Patient c/o a productive cough with yellow/green thick sputum and nasal congestion x 3-4 days ago. Patient also c/o a headache daily x 3-4 days.

## 2022-09-20 NOTE — ED Provider Notes (Signed)
[facility]    CSN: [id number] Arrival date & time: 09/20/22  1857      History   Chief Complaint Chief Complaint  Patient presents with   Cough    Regular and persistent productive cough w green and yellow mucus. Sinus drainage. Consistent low grade headaches. Possible fever since I have periods of experiencing sweats and feeling warm or hot but have no way of measuring temperature. Sleeping. - Entered by patient    HPI Nicole [name] is a 47 y.o. female.   Patient presents to clinic for a productive cough with nasal congestion.  Productive cough is yellow-green.  She has not taken her temperature at home, however, she has prolonged cold.  She reports shortness of breath with coughing spells and feels like she cannot catch her breath while she is coughing.  Denies wheezing or chest pain.  She does not actively smoke, up until 10 days ago she did smoke crack.  Denies cigarette use.  Patient is taking HIV PrEP medication currently, denies current HIV or previous HIV infection.    The history is provided by the patient and medical records.  Cough Associated symptoms: chills, rhinorrhea and shortness of breath   Associated symptoms: no chest pain, no eye discharge, no sore throat and no wheezing     Past Medical History:  Diagnosis Date   Abnormal pap    w LEEP   Alcoholism (HCC)    Allergy    Asthma    Blood in stool    Depression (emotion)    Frequent headaches    Hepatitis C, chronic (HCC)    hep c tx successful   Irritable bowel syndrome    OSA on CPAP - sees Dr. Jenne Campus in ENT 10/09/2013   Seasonal allergies    scratch test in 2006///mold issue in house    Patient Active Problem List   Diagnosis Date Noted   Nonallergic rhinitis 08/23/2020   Mild persistent asthma without complication 06/14/2020   Other allergic rhinitis 06/14/2020   Adverse reaction to food, subsequent encounter 06/14/2020   IBS (irritable bowel syndrome) 05/24/2016   Chronic venous  insufficiency 05/24/2016   Migraine 05/24/2016   Pulmonary nodules 10/27/2014   Hepatitis C, chronic (HCC)    Depression 10/09/2013   OSA on CPAP - sees Dr. Jenne Campus in ENT 10/09/2013   Allergic rhinitis 05/30/2012   Cough variant asthma 02/08/2012    Past Surgical History:  Procedure Laterality Date   DILATION AND CURETTAGE OF UTERUS  1996, 2001, 2002   x 3   LEEP  2008   NASAL FRACTURE SURGERY  1999   repair   NASAL SEPTOPLASTY W/ TURBINOPLASTY      OB History     Gravida  4   Para      Term      Preterm      AB  4   Living  0      SAB  1   IAB  3   Ectopic      Multiple      Live Births               Home Medications    Prior to Admission medications   Medication Sig Start Date End Date Taking? Authorizing Provider  amoxicillin-clavulanate (AUGMENTIN) 875-125 MG tablet Take 1 tablet by mouth every 12 (twelve) hours. 09/20/22  Yes Rinaldo Ratel, [place] N, FNP  promethazine-dextromethorphan (PROMETHAZINE-DM) 6.25-15 MG/5ML syrup Take 5 mLs by mouth 4 (four) times  daily as needed for cough. 09/20/22  Yes Rinaldo Ratel, [place] N, FNP  ARIPiprazole (ABILIFY) 15 MG tablet Take 15 mg by mouth daily.    [provider]  azelastine (ASTELIN) 0.1 % nasal spray Place 1 spray into both nostrils 2 (two) times daily as needed (post nasal drip/runny nose). Patient not taking: Reported on 04/27/2022 08/23/20   Ellamae Sia, DO  ciclesonide (ALVESCO) 160 MCG/ACT inhaler Inhale 1 puff into the lungs 2 (two) times daily. with spacer and rinse mouth afterwards. 10/15/20   Ellamae Sia, DO  elvitegravir-cobicistat-emtricitabine-tenofovir (GENVOYA) 150-150-200-10 MG TABS tablet Take 1 tablet by mouth daily with breakfast. 05/03/22   Gareth Eagle, PA-C  FLUoxetine (PROZAC) 40 MG capsule Take 40 mg by mouth daily. 02/13/21   [provider]  fluticasone (FLONASE) 50 MCG/ACT nasal spray SPRAY TWO SPRAYS IN THE AFFECTED NOSTRIL DAILY Patient taking differently: Place 1  spray into both nostrils daily. 04/13/21   Wynn Banker, MD  HYDROcodone-acetaminophen (NORCO/VICODIN) 5-325 MG tablet Take 1 tablet by mouth every 6 (six) hours as needed for severe pain. 05/03/22   Smoot, Shawn Route, PA-C  levalbuterol (XOPENEX HFA) 45 MCG/ACT inhaler Inhale 2 puffs into the lungs every 4 (four) hours as needed for wheezing or shortness of breath. 06/14/20   Ellamae Sia, DO  levonorgestrel (MIRENA) 20 MCG/24HR IUD 1 each by Intrauterine route once.    [provider]  Multiple Vitamin (MULTIVITAMINS PO) Take 1 tablet by mouth daily.     [provider]  predniSONE (STERAPRED UNI-PAK 21 TAB) 10 MG (21) TBPK tablet Take as directed 09/14/22   Lomax, Amy, NP  Rimegepant Sulfate (NURTEC) 75 MG TBDP Take 1 tablet (75 mg total) by mouth daily as needed (take for abortive therapy of migraine, no more than 1 tablet in 24 hours or 10 per month). 07/18/22   Lomax, Amy, NP  topiramate (TOPAMAX) 25 MG tablet Take 3 tablets (75 mg total) by mouth daily. 04/13/22   Huston Foley, MD    Family History Family History  Problem Relation Age of Onset   Emphysema Mother    Colon polyps Mother    Diverticulitis Mother    Heart disease Father    Cancer Father        unsure origin; had spot on lung   Hypertension Father    Hepatitis C Father    Alcohol abuse Father    Drug abuse Father    Stroke Maternal Grandmother    Osteoporosis Maternal Grandmother    Alcohol abuse Maternal Grandfather    Liver disease Maternal Grandfather    Migraines Neg Hx     Social History Social History   Tobacco Use   Smoking status: Former    Packs/day: 1.50    Years: 15.00    Additional pack years: 0.00    Total pack years: 22.50    Types: Cigarettes    Quit date: 05/15/2005    Years since quitting: 17.3   Smokeless tobacco: Never   Tobacco comments:    quit in 2007, smoked for 13 years   Vaping Use   Vaping Use: Never used  Substance Use Topics   Alcohol use: No     Alcohol/week: 0.0 standard drinks of alcohol    Comment: quit drinking on 2004---relapse in 2007---QUIT 07/2005   Drug use: Yes    Comment: crack     Allergies   Cashew nut oil, Other, Triptans, Lexapro [escitalopram oxalate], Cefaclor, and Nickel  Review of Systems Review of Systems  Constitutional:  Positive for chills.  HENT:  Positive for congestion, rhinorrhea, sinus pressure and sinus pain. Negative for sore throat.   Eyes:  Negative for discharge and itching.  Respiratory:  Positive for cough and shortness of breath. Negative for wheezing.   Cardiovascular:  Negative for chest pain.  Gastrointestinal:  Negative for abdominal pain.  Genitourinary:  Positive for dysuria. Negative for flank pain, vaginal bleeding and vaginal discharge.  Musculoskeletal:  Negative for back pain.     Physical Exam Triage Vital Signs ED Triage Vitals  Enc Vitals Group     BP 09/20/22 1959 126/84     Pulse Rate 09/20/22 1959 98     Resp 09/20/22 1959 16     Temp 09/20/22 1959 98.6 F (37 C)     Temp Source 09/20/22 1959 Oral     SpO2 09/20/22 1959 97 %     Weight --      Height --      Head Circumference --      Peak Flow --      Pain Score 09/20/22 2001 5     Pain Loc --      Pain Edu? --      Excl. in GC? --    No data found.  Updated Vital Signs BP 126/84 (BP Location: Left Arm)   Pulse 98   Temp 98.6 F (37 C) (Oral)   Resp 16   SpO2 97%   Visual Acuity Right Eye Distance:   Left Eye Distance:   Bilateral Distance:    Right Eye Near:   Left Eye Near:    Bilateral Near:     Physical Exam Vitals and nursing note reviewed.  Constitutional:      Appearance: Normal appearance.  HENT:     Head: Normocephalic and atraumatic.     Right Ear: External ear normal.     Left Ear: External ear normal.     Nose: Congestion and rhinorrhea present.     Right Turbinates: Enlarged and swollen.     Left Turbinates: Enlarged and swollen.     Right Sinus: Maxillary sinus  tenderness present.     Left Sinus: Maxillary sinus tenderness present.     Mouth/Throat:     Mouth: Mucous membranes are moist.  Eyes:     General: No scleral icterus.    Conjunctiva/sclera: Conjunctivae normal.  Cardiovascular:     Rate and Rhythm: Normal rate and regular rhythm.     Heart sounds: No murmur heard. Pulmonary:     Effort: Pulmonary effort is normal. No respiratory distress.     Breath sounds: Normal breath sounds.  Abdominal:     Tenderness: There is no right CVA tenderness or left CVA tenderness.  Musculoskeletal:        General: No swelling. Normal range of motion.     Cervical back: Normal range of motion and neck supple. No rigidity.  Lymphadenopathy:     Cervical: No cervical adenopathy.  Skin:    General: Skin is warm and dry.  Neurological:     General: No focal deficit present.     Mental Status: She is alert and oriented to person, place, and time.  Psychiatric:        Mood and Affect: Mood normal.        Behavior: Behavior normal.      UC Treatments / Results  Labs (all labs ordered are listed, but only abnormal results  are displayed) Labs Reviewed  POCT URINALYSIS DIP (MANUAL ENTRY) - Abnormal; Notable for the following components:      Result Value   Clarity, UA hazy (*)    Spec Grav, UA >=1.030 (*)    Leukocytes, UA Trace (*)    All other components within normal limits  URINE CULTURE    EKG   Radiology No results found.  Procedures Procedures (including critical care time)  Medications Ordered in UC Medications - No data to display  Initial Impression / Assessment and Plan / UC Course  I have reviewed the triage vital signs and the nursing notes.  Pertinent labs & imaging results that were available during my care of the patient were reviewed by me and considered in my medical decision making (see chart for details).  Vitals and triage reviewed, patient is hemodynamically stable.  On day 5 of sinus pain and tenderness with  rhinorrhea and congestion.  Lungs vesicular posteriorly, vitals are stable, low concern for systemic illness or pneumonia.  Will cover with Augmentin for bacterial sinus infection and cough syrup as needed.  Patient also complains of dysuria with urge incontinence, urinalysis with trace leukocytes, will send for culture.  Return and follow-up precautions discussed, no questions at this time.      Final Clinical Impressions(s) / UC Diagnoses   Final diagnoses:  Acute bacterial sinusitis  Dysuria     Discharge Instructions      Your symptoms are consistent with a bacterial sinus infection, please take all antibiotics as prescribed and until finished.  You can take them with food to help prevent gastrointestinal upset.  I also suggest Mucinex 1200 mg daily, as well as 64 ounces of water to help loosen up your secretions.  You can sleep with a humidifier as well.  You can take the cough syrup as needed at night, do not drink or drive on this medication as it may make you drowsy.  Your urine was without obvious signs of infection, I am sending this for culture and I will contact you if the results are positive and antibiotics are indicated.  Please ensure you are drinking plenty of water, avoid sodas and juices as these are urinary irritants.  Please return to clinic for any new or concerning symptoms.      ED Prescriptions     Medication Sig Dispense Auth. Provider   amoxicillin-clavulanate (AUGMENTIN) 875-125 MG tablet Take 1 tablet by mouth every 12 (twelve) hours. 14 tablet Rinaldo Ratel, [place] N, [place]   promethazine-dextromethorphan (PROMETHAZINE-DM) 6.25-15 MG/5ML syrup Take 5 mLs by mouth 4 (four) times daily as needed for cough. 118 mL Cynai Skeens, [place] N, [place]      PDMP not reviewed this encounter.   Maecy Podgurski, [place] N, [place] 09/20/22 2037

## 2022-09-22 LAB — URINE CULTURE: Culture: >=100000 — AB

## 2022-10-11 ENCOUNTER — Ambulatory Visit (INDEPENDENT_AMBULATORY_CARE_PROVIDER_SITE_OTHER): Payer: Medicaid Other | Admitting: Family Medicine

## 2022-10-11 VITALS — BP 115/75 | HR 113 | Temp 98.1°F | Resp 16 | Ht 60.0 in | Wt 109.8 lb

## 2022-10-11 DIAGNOSIS — K589 Irritable bowel syndrome without diarrhea: Secondary | ICD-10-CM

## 2022-10-11 DIAGNOSIS — K581 Irritable bowel syndrome with constipation: Secondary | ICD-10-CM

## 2022-10-11 DIAGNOSIS — Z7689 Persons encountering health services in other specified circumstances: Secondary | ICD-10-CM | POA: Diagnosis not present

## 2022-10-11 DIAGNOSIS — R634 Abnormal weight loss: Secondary | ICD-10-CM

## 2022-10-11 DIAGNOSIS — F339 Major depressive disorder, recurrent, unspecified: Secondary | ICD-10-CM | POA: Diagnosis not present

## 2022-10-11 DIAGNOSIS — F1911 Other psychoactive substance abuse, in remission: Secondary | ICD-10-CM | POA: Diagnosis not present

## 2022-10-11 DIAGNOSIS — Z87898 Personal history of other specified conditions: Secondary | ICD-10-CM | POA: Diagnosis not present

## 2022-10-11 NOTE — Progress Notes (Signed)
Patient is here to established care with provider today. Patient has many health concern they would like to discuss with provider today  Care gaps discuss at appointment today  

## 2022-10-11 NOTE — Progress Notes (Signed)
New Patient Office Visit  Subjective    Patient ID: Nicole [name], female    DOB: 11/06/1975  Age: 47 y.o. MRN: [id number]  CC:  Chief Complaint  Patient presents with   Establish Care    HPI [name] presents to establish care and for review of some chronic med issues, particularly substance abuse (crack and intermittent alcohol),  depression, and possible abnormal mammogram.    Outpatient Encounter Medications as of 10/11/2022  Medication Sig   Albuterol Sulfate (PROAIR RESPICLICK) 108 (90 Base) MCG/ACT AEPB Inhale into the lungs.   amoxicillin-clavulanate (AUGMENTIN) 875-125 MG tablet Take 1 tablet by mouth every 12 (twelve) hours.   ARIPiprazole (ABILIFY) 15 MG tablet Take 15 mg by mouth daily.   cetirizine (ZYRTEC) 10 MG tablet Take by mouth.   diphenoxylate-atropine (LOMOTIL) 2.5-0.025 MG tablet Take by mouth.   emtricitabine-tenofovir (TRUVADA) 200-300 MG tablet Take 1 tablet by mouth daily.   fluticasone (FLONASE) 50 MCG/ACT nasal spray SPRAY TWO SPRAYS IN THE AFFECTED NOSTRIL DAILY (Patient taking differently: Place 1 spray into both nostrils daily.)   hydrALAZINE (APRESOLINE) 25 MG tablet Take 25 mg by mouth 3 (three) times daily.   levalbuterol (XOPENEX HFA) 45 MCG/ACT inhaler Inhale 2 puffs into the lungs every 4 (four) hours as needed for wheezing or shortness of breath.   ciclesonide (ALVESCO) 160 MCG/ACT inhaler Inhale 1 puff into the lungs 2 (two) times daily. with spacer and rinse mouth afterwards. (Patient not taking: Reported on 10/11/2022)   FLUoxetine (PROZAC) 40 MG capsule Take 40 mg by mouth daily.   HYDROcodone-acetaminophen (NORCO/VICODIN) 5-325 MG tablet Take 1 tablet by mouth every 6 (six) hours as needed for severe pain.   levonorgestrel (MIRENA) 20 MCG/24HR IUD 1 each by Intrauterine route once.   Multiple Vitamin (MULTIVITAMINS PO) Take 1 tablet by mouth daily.    Multiple Vitamin (QUINTABS) TABS Take 1 tablet by mouth daily. (Patient not taking:  Reported on 10/11/2022)   ondansetron (ZOFRAN-ODT) 4 MG disintegrating tablet Take by mouth. (Patient not taking: Reported on 10/11/2022)   predniSONE (STERAPRED UNI-PAK 21 TAB) 10 MG (21) TBPK tablet Take as directed (Patient not taking: Reported on 10/11/2022)   promethazine-dextromethorphan (PROMETHAZINE-DM) 6.25-15 MG/5ML syrup Take 5 mLs by mouth 4 (four) times daily as needed for cough.   Rimegepant Sulfate (NURTEC) 75 MG TBDP Take 1 tablet (75 mg total) by mouth daily as needed (take for abortive therapy of migraine, no more than 1 tablet in 24 hours or 10 per month).   topiramate (TOPAMAX) 25 MG tablet Take 3 tablets (75 mg total) by mouth daily.   [DISCONTINUED] azelastine (ASTELIN) 0.1 % nasal spray Place 1 spray into both nostrils 2 (two) times daily as needed (post nasal drip/runny nose). (Patient not taking: Reported on 04/27/2022)   [DISCONTINUED] elvitegravir-cobicistat-emtricitabine-tenofovir (GENVOYA) 150-150-200-10 MG TABS tablet Take 1 tablet by mouth daily with breakfast.   No facility-administered encounter medications on file as of 10/11/2022.    Past Medical History:  Diagnosis Date   Abnormal pap    w LEEP   Alcoholism (HCC)    Allergy    Asthma    Blood in stool    Depression (emotion)    Frequent headaches    Hepatitis C, chronic (HCC)    hep c tx successful   Irritable bowel syndrome    OSA on CPAP - sees Dr. Jenne Campus in ENT 10/09/2013   Seasonal allergies    scratch test in 2006///mold issue in house  Past Surgical History:  Procedure Laterality Date   DILATION AND CURETTAGE OF UTERUS  1996, 2001, 2002   x 3   LEEP  2008   NASAL FRACTURE SURGERY  1999   repair   NASAL SEPTOPLASTY W/ TURBINOPLASTY      Family History  Problem Relation Age of Onset   Emphysema Mother    Colon polyps Mother    Diverticulitis Mother    Heart disease Father    Cancer Father        unsure origin; had spot on lung   Hypertension Father    Hepatitis C Father     Alcohol abuse Father    Drug abuse Father    Stroke Maternal Grandmother    Osteoporosis Maternal Grandmother    Alcohol abuse Maternal Grandfather    Liver disease Maternal Grandfather    Migraines Neg Hx     Social History   Socioeconomic History   Marital status: Unknown    Spouse name: Not on file   Number of children: 0   Years of education: Not on file   Highest education level: Not on file  Occupational History   Occupation: Adult nurse: [employer]  Tobacco Use   Smoking status: Former    Packs/day: 1.50    Years: 15.00    Additional pack years: 0.00    Total pack years: 22.50    Types: Cigarettes    Quit date: 05/15/2005    Years since quitting: 17.4   Smokeless tobacco: Never   Tobacco comments:    quit in 2007, smoked for 13 years   Vaping Use   Vaping Use: Never used  Substance and Sexual Activity   Alcohol use: No    Alcohol/week: 0.0 standard drinks of alcohol    Comment: quit drinking on 2004---relapse in 2007---QUIT 07/2005   Drug use: Yes    Comment: crack   Sexual activity: Yes    Partners: Male    Birth control/protection: Pill    Comment: First sexual intercourse at age 79,  more than 5 sexual partners   Other Topics Concern   Not on file  Social History Narrative   Work or School: Glass blower/designer Situation: lives with husband      Spiritual Beliefs: none      Lifestyle: no regular exercise; diet is fair               Social Determinants of Corporate investment banker Strain: Not on file  Food Insecurity: Not on file  Transportation Needs: Not on file  Physical Activity: Not on file  Stress: Not on file  Social Connections: Not on file  Intimate Partner Violence: Not on file    Review of Systems  All other systems reviewed and are negative.       Objective    BP 115/75   Pulse (!) 113   Temp 98.1 F (36.7 C) (Oral)   Resp 16   Ht 5' (1.524 m)   Wt 109 lb 12.8 oz (49.8 kg)    SpO2 97%   BMI 21.44 kg/m   Physical Exam Vitals and nursing note reviewed.  Constitutional:      General: She is not in acute distress. Cardiovascular:     Rate and Rhythm: Normal rate and regular rhythm.  Pulmonary:     Effort: Pulmonary effort is normal.     Breath sounds: Normal breath sounds.  Abdominal:     Palpations: Abdomen is soft.     Tenderness: There is no abdominal tenderness.  Neurological:     General: No focal deficit present.     Mental Status: She is alert and oriented to person, place, and time.         Assessment & Plan:   1. Encounter to establish care   2. Episode of recurrent major depressive disorder, unspecified depression episode severity (HCC) Management as per consultant  3. History of abnormal mammogram Patient to follow up with imaging as recommended.   4. History of substance abuse [facility]) Patient scheduled for future inpatient intervention  5. Irritable bowel syndrome with constipation Sample meds given  6. Loss of weight monitor    No follow-ups on file.   Tommie Raymond, MD

## 2022-10-13 ENCOUNTER — Encounter: Payer: Self-pay | Admitting: Family Medicine

## 2022-10-14 ENCOUNTER — Other Ambulatory Visit (HOSPITAL_COMMUNITY): Payer: Self-pay

## 2022-10-31 ENCOUNTER — Encounter (HOSPITAL_COMMUNITY): Payer: Self-pay

## 2022-10-31 ENCOUNTER — Inpatient Hospital Stay (HOSPITAL_COMMUNITY)
Admission: EM | Admit: 2022-10-31 | Discharge: 2022-11-18 | DRG: 870 | Disposition: A | Discharge status: Rehabilitation Facility | Payer: Medicaid Other | Attending: Student | Admitting: Student

## 2022-10-31 ENCOUNTER — Emergency Department (HOSPITAL_COMMUNITY): Payer: Medicaid Other

## 2022-10-31 ENCOUNTER — Other Ambulatory Visit: Payer: Self-pay

## 2022-10-31 ENCOUNTER — Emergency Department (HOSPITAL_COMMUNITY): Payer: MEDICAID

## 2022-10-31 ENCOUNTER — Inpatient Hospital Stay (HOSPITAL_COMMUNITY): Payer: MEDICAID

## 2022-10-31 DIAGNOSIS — G8194 Hemiplegia, unspecified affecting left nondominant side: Secondary | ICD-10-CM | POA: Diagnosis present

## 2022-10-31 DIAGNOSIS — R5383 Other fatigue: Secondary | ICD-10-CM | POA: Diagnosis not present

## 2022-10-31 DIAGNOSIS — L559 Sunburn, unspecified: Secondary | ICD-10-CM | POA: Diagnosis present

## 2022-10-31 DIAGNOSIS — M6282 Rhabdomyolysis: Secondary | ICD-10-CM | POA: Diagnosis present

## 2022-10-31 DIAGNOSIS — L309 Dermatitis, unspecified: Secondary | ICD-10-CM | POA: Diagnosis not present

## 2022-10-31 DIAGNOSIS — S90822A Blister (nonthermal), left foot, initial encounter: Secondary | ICD-10-CM | POA: Diagnosis not present

## 2022-10-31 DIAGNOSIS — G9341 Metabolic encephalopathy: Secondary | ICD-10-CM | POA: Diagnosis not present

## 2022-10-31 DIAGNOSIS — E861 Hypovolemia: Secondary | ICD-10-CM | POA: Diagnosis present

## 2022-10-31 DIAGNOSIS — Z681 Body mass index (BMI) 19 or less, adult: Secondary | ICD-10-CM

## 2022-10-31 DIAGNOSIS — J690 Pneumonitis due to inhalation of food and vomit: Secondary | ICD-10-CM | POA: Diagnosis not present

## 2022-10-31 DIAGNOSIS — I10 Essential (primary) hypertension: Secondary | ICD-10-CM | POA: Diagnosis not present

## 2022-10-31 DIAGNOSIS — A419 Sepsis, unspecified organism: Secondary | ICD-10-CM | POA: Diagnosis not present

## 2022-10-31 DIAGNOSIS — J851 Abscess of lung with pneumonia: Secondary | ICD-10-CM | POA: Diagnosis not present

## 2022-10-31 DIAGNOSIS — R6520 Severe sepsis without septic shock: Secondary | ICD-10-CM | POA: Diagnosis present

## 2022-10-31 DIAGNOSIS — R21 Rash and other nonspecific skin eruption: Secondary | ICD-10-CM | POA: Diagnosis not present

## 2022-10-31 DIAGNOSIS — R0609 Other forms of dyspnea: Secondary | ICD-10-CM | POA: Diagnosis not present

## 2022-10-31 DIAGNOSIS — F1411 Cocaine abuse, in remission: Secondary | ICD-10-CM | POA: Diagnosis not present

## 2022-10-31 DIAGNOSIS — N179 Acute kidney failure, unspecified: Secondary | ICD-10-CM | POA: Diagnosis not present

## 2022-10-31 DIAGNOSIS — E871 Hypo-osmolality and hyponatremia: Secondary | ICD-10-CM | POA: Diagnosis not present

## 2022-10-31 DIAGNOSIS — L89816 Pressure-induced deep tissue damage of head: Secondary | ICD-10-CM | POA: Diagnosis present

## 2022-10-31 DIAGNOSIS — R0603 Acute respiratory distress: Secondary | ICD-10-CM

## 2022-10-31 DIAGNOSIS — G931 Anoxic brain damage, not elsewhere classified: Secondary | ICD-10-CM | POA: Diagnosis present

## 2022-10-31 DIAGNOSIS — Z7189 Other specified counseling: Secondary | ICD-10-CM | POA: Diagnosis not present

## 2022-10-31 DIAGNOSIS — R571 Hypovolemic shock: Secondary | ICD-10-CM | POA: Diagnosis present

## 2022-10-31 DIAGNOSIS — R29810 Facial weakness: Secondary | ICD-10-CM | POA: Diagnosis present

## 2022-10-31 DIAGNOSIS — E43 Unspecified severe protein-calorie malnutrition: Secondary | ICD-10-CM | POA: Diagnosis present

## 2022-10-31 DIAGNOSIS — J189 Pneumonia, unspecified organism: Secondary | ICD-10-CM | POA: Diagnosis not present

## 2022-10-31 DIAGNOSIS — R6521 Severe sepsis with septic shock: Secondary | ICD-10-CM | POA: Diagnosis present

## 2022-10-31 DIAGNOSIS — D72829 Elevated white blood cell count, unspecified: Secondary | ICD-10-CM | POA: Diagnosis not present

## 2022-10-31 DIAGNOSIS — I950 Idiopathic hypotension: Secondary | ICD-10-CM | POA: Diagnosis not present

## 2022-10-31 DIAGNOSIS — F418 Other specified anxiety disorders: Secondary | ICD-10-CM | POA: Diagnosis not present

## 2022-10-31 DIAGNOSIS — Z6824 Body mass index (BMI) 24.0-24.9, adult: Secondary | ICD-10-CM

## 2022-10-31 DIAGNOSIS — J984 Other disorders of lung: Secondary | ICD-10-CM

## 2022-10-31 DIAGNOSIS — K5901 Slow transit constipation: Secondary | ICD-10-CM | POA: Diagnosis not present

## 2022-10-31 DIAGNOSIS — E860 Dehydration: Secondary | ICD-10-CM | POA: Diagnosis not present

## 2022-10-31 DIAGNOSIS — G4700 Insomnia, unspecified: Secondary | ICD-10-CM | POA: Diagnosis not present

## 2022-10-31 DIAGNOSIS — R5381 Other malaise: Secondary | ICD-10-CM | POA: Diagnosis not present

## 2022-10-31 DIAGNOSIS — J9601 Acute respiratory failure with hypoxia: Secondary | ICD-10-CM | POA: Diagnosis not present

## 2022-10-31 DIAGNOSIS — L89322 Pressure ulcer of left buttock, stage 2: Secondary | ICD-10-CM | POA: Diagnosis present

## 2022-10-31 DIAGNOSIS — E870 Hyperosmolality and hypernatremia: Secondary | ICD-10-CM | POA: Diagnosis present

## 2022-10-31 DIAGNOSIS — F1911 Other psychoactive substance abuse, in remission: Secondary | ICD-10-CM | POA: Diagnosis not present

## 2022-10-31 DIAGNOSIS — R001 Bradycardia, unspecified: Secondary | ICD-10-CM | POA: Diagnosis present

## 2022-10-31 DIAGNOSIS — E876 Hypokalemia: Secondary | ICD-10-CM | POA: Diagnosis not present

## 2022-10-31 DIAGNOSIS — G8250 Quadriplegia, unspecified: Secondary | ICD-10-CM | POA: Diagnosis not present

## 2022-10-31 DIAGNOSIS — F1910 Other psychoactive substance abuse, uncomplicated: Secondary | ICD-10-CM | POA: Diagnosis not present

## 2022-10-31 DIAGNOSIS — E878 Other disorders of electrolyte and fluid balance, not elsewhere classified: Secondary | ICD-10-CM | POA: Diagnosis present

## 2022-10-31 DIAGNOSIS — G903 Multi-system degeneration of the autonomic nervous system: Secondary | ICD-10-CM | POA: Diagnosis not present

## 2022-10-31 DIAGNOSIS — E872 Acidosis, unspecified: Secondary | ICD-10-CM | POA: Diagnosis present

## 2022-10-31 DIAGNOSIS — R300 Dysuria: Secondary | ICD-10-CM | POA: Diagnosis not present

## 2022-10-31 DIAGNOSIS — F1990 Other psychoactive substance use, unspecified, uncomplicated: Secondary | ICD-10-CM

## 2022-10-31 DIAGNOSIS — I639 Cerebral infarction, unspecified: Secondary | ICD-10-CM | POA: Diagnosis not present

## 2022-10-31 DIAGNOSIS — E162 Hypoglycemia, unspecified: Secondary | ICD-10-CM | POA: Diagnosis not present

## 2022-10-31 DIAGNOSIS — Z1152 Encounter for screening for COVID-19: Secondary | ICD-10-CM

## 2022-10-31 DIAGNOSIS — N170 Acute kidney failure with tubular necrosis: Secondary | ICD-10-CM | POA: Diagnosis not present

## 2022-10-31 DIAGNOSIS — F1490 Cocaine use, unspecified, uncomplicated: Secondary | ICD-10-CM | POA: Diagnosis not present

## 2022-10-31 DIAGNOSIS — J9600 Acute respiratory failure, unspecified whether with hypoxia or hypercapnia: Secondary | ICD-10-CM | POA: Diagnosis present

## 2022-10-31 DIAGNOSIS — K7200 Acute and subacute hepatic failure without coma: Secondary | ICD-10-CM | POA: Diagnosis present

## 2022-10-31 DIAGNOSIS — J15212 Pneumonia due to Methicillin resistant Staphylococcus aureus: Secondary | ICD-10-CM | POA: Diagnosis not present

## 2022-10-31 DIAGNOSIS — R32 Unspecified urinary incontinence: Secondary | ICD-10-CM | POA: Diagnosis not present

## 2022-10-31 DIAGNOSIS — E8809 Other disorders of plasma-protein metabolism, not elsewhere classified: Secondary | ICD-10-CM | POA: Diagnosis present

## 2022-10-31 DIAGNOSIS — Z22322 Carrier or suspected carrier of Methicillin resistant Staphylococcus aureus: Secondary | ICD-10-CM

## 2022-10-31 DIAGNOSIS — R1312 Dysphagia, oropharyngeal phase: Secondary | ICD-10-CM | POA: Diagnosis not present

## 2022-10-31 DIAGNOSIS — R252 Cramp and spasm: Secondary | ICD-10-CM | POA: Diagnosis not present

## 2022-10-31 DIAGNOSIS — R1310 Dysphagia, unspecified: Secondary | ICD-10-CM | POA: Diagnosis not present

## 2022-10-31 DIAGNOSIS — R402432 Glasgow coma scale score 3-8, at arrival to emergency department: Secondary | ICD-10-CM

## 2022-10-31 DIAGNOSIS — N319 Neuromuscular dysfunction of bladder, unspecified: Secondary | ICD-10-CM | POA: Diagnosis not present

## 2022-10-31 DIAGNOSIS — R339 Retention of urine, unspecified: Secondary | ICD-10-CM | POA: Diagnosis not present

## 2022-10-31 DIAGNOSIS — Z781 Physical restraint status: Secondary | ICD-10-CM

## 2022-10-31 DIAGNOSIS — N390 Urinary tract infection, site not specified: Secondary | ICD-10-CM | POA: Diagnosis not present

## 2022-10-31 DIAGNOSIS — K5900 Constipation, unspecified: Secondary | ICD-10-CM | POA: Diagnosis not present

## 2022-10-31 DIAGNOSIS — R4182 Altered mental status, unspecified: Secondary | ICD-10-CM | POA: Diagnosis not present

## 2022-10-31 DIAGNOSIS — F1410 Cocaine abuse, uncomplicated: Secondary | ICD-10-CM | POA: Diagnosis present

## 2022-10-31 DIAGNOSIS — L89320 Pressure ulcer of left buttock, unstageable: Secondary | ICD-10-CM | POA: Diagnosis not present

## 2022-10-31 DIAGNOSIS — R000 Tachycardia, unspecified: Secondary | ICD-10-CM | POA: Diagnosis not present

## 2022-10-31 DIAGNOSIS — B9562 Methicillin resistant Staphylococcus aureus infection as the cause of diseases classified elsewhere: Secondary | ICD-10-CM | POA: Diagnosis present

## 2022-10-31 DIAGNOSIS — G936 Cerebral edema: Secondary | ICD-10-CM | POA: Diagnosis present

## 2022-10-31 DIAGNOSIS — Z515 Encounter for palliative care: Secondary | ICD-10-CM | POA: Diagnosis not present

## 2022-10-31 DIAGNOSIS — R569 Unspecified convulsions: Secondary | ICD-10-CM | POA: Diagnosis not present

## 2022-10-31 DIAGNOSIS — Z79899 Other long term (current) drug therapy: Secondary | ICD-10-CM

## 2022-10-31 DIAGNOSIS — A499 Bacterial infection, unspecified: Secondary | ICD-10-CM | POA: Diagnosis not present

## 2022-10-31 DIAGNOSIS — I6389 Other cerebral infarction: Secondary | ICD-10-CM | POA: Diagnosis not present

## 2022-10-31 DIAGNOSIS — I959 Hypotension, unspecified: Secondary | ICD-10-CM | POA: Diagnosis not present

## 2022-10-31 DIAGNOSIS — Z7151 Drug abuse counseling and surveillance of drug abuser: Secondary | ICD-10-CM

## 2022-10-31 DIAGNOSIS — R790 Abnormal level of blood mineral: Secondary | ICD-10-CM | POA: Diagnosis not present

## 2022-10-31 DIAGNOSIS — I609 Nontraumatic subarachnoid hemorrhage, unspecified: Secondary | ICD-10-CM | POA: Diagnosis present

## 2022-10-31 DIAGNOSIS — R159 Full incontinence of feces: Secondary | ICD-10-CM | POA: Diagnosis not present

## 2022-10-31 DIAGNOSIS — M25431 Effusion, right wrist: Secondary | ICD-10-CM | POA: Diagnosis not present

## 2022-10-31 DIAGNOSIS — L409 Psoriasis, unspecified: Secondary | ICD-10-CM | POA: Diagnosis not present

## 2022-10-31 DIAGNOSIS — B9561 Methicillin susceptible Staphylococcus aureus infection as the cause of diseases classified elsewhere: Secondary | ICD-10-CM | POA: Diagnosis not present

## 2022-10-31 DIAGNOSIS — F0789 Other personality and behavioral disorders due to known physiological condition: Secondary | ICD-10-CM | POA: Diagnosis not present

## 2022-10-31 HISTORY — DX: Anxiety disorder, unspecified: F41.9

## 2022-10-31 HISTORY — DX: Unspecified asthma, uncomplicated: J45.909

## 2022-10-31 HISTORY — DX: Depression, unspecified: F32.A

## 2022-10-31 LAB — URINALYSIS, ROUTINE W REFLEX MICROSCOPIC
Bilirubin Urine: NEGATIVE
Glucose, UA: NEGATIVE mg/dL
Ketones, ur: NEGATIVE mg/dL
Nitrite: NEGATIVE
Protein, ur: 100 mg/dL — AB
RBC / HPF: >50 RBC/hpf (ref 0–5)
Specific Gravity, Urine: 1.024 (ref 1.005–1.030)
WBC, UA: >50 WBC/hpf (ref 0–5)
pH: 5 (ref 5.0–8.0)

## 2022-10-31 LAB — CBC WITH DIFFERENTIAL/PLATELET
Abs Immature Granulocytes: 0.5 10*3/uL — ABNORMAL HIGH (ref 0.00–0.07)
Basophils Absolute: 0 10*3/uL (ref 0.0–0.1)
Basophils Relative: 0 %
Eosinophils Absolute: 0 10*3/uL (ref 0.0–0.5)
Eosinophils Relative: 0 %
HCT: 54.7 % — ABNORMAL HIGH (ref 36.0–46.0)
Hemoglobin: 18 g/dL — ABNORMAL HIGH (ref 12.0–15.0)
Immature Granulocytes: 1 %
Lymphocytes Relative: 4 %
Lymphs Abs: 1.8 10*3/uL (ref 0.7–4.0)
MCH: 32.3 pg (ref 26.0–34.0)
MCHC: 32.9 g/dL (ref 30.0–36.0)
MCV: 98 fL (ref 80.0–100.0)
Monocytes Absolute: 1.6 10*3/uL — ABNORMAL HIGH (ref 0.1–1.0)
Monocytes Relative: 4 %
Neutro Abs: 39.7 10*3/uL — ABNORMAL HIGH (ref 1.7–7.7)
Neutrophils Relative %: 91 %
Platelets: 272 10*3/uL (ref 150–400)
RBC: 5.58 MIL/uL — ABNORMAL HIGH (ref 3.87–5.11)
RDW: 13.2 % (ref 11.5–15.5)
WBC: 43.6 10*3/uL — ABNORMAL HIGH (ref 4.0–10.5)
nRBC: 0.2 % (ref 0.0–0.2)

## 2022-10-31 LAB — RAPID URINE DRUG SCREEN, HOSP PERFORMED
Amphetamines: NOT DETECTED
Barbiturates: NOT DETECTED
Benzodiazepines: NOT DETECTED
Cocaine: POSITIVE — AB
Opiates: NOT DETECTED
Tetrahydrocannabinol: NOT DETECTED

## 2022-10-31 LAB — I-STAT ARTERIAL BLOOD GAS, ED
Acid-base deficit: 2 mmol/L (ref 0.0–2.0)
Bicarbonate: 23 mmol/L (ref 20.0–28.0)
Calcium, Ion: 1.14 mmol/L — ABNORMAL LOW (ref 1.15–1.40)
HCT: 45 % (ref 36.0–46.0)
Hemoglobin: 15.3 g/dL — ABNORMAL HIGH (ref 12.0–15.0)
O2 Saturation: 100 %
Patient temperature: 99.3
Potassium: 3.1 mmol/L — ABNORMAL LOW (ref 3.5–5.1)
Sodium: 164 mmol/L (ref 135–145)
TCO2: 24 mmol/L (ref 22–32)
pCO2 arterial: 38.5 mmHg (ref 32–48)
pH, Arterial: 7.387 (ref 7.35–7.45)
pO2, Arterial: 365 mmHg — ABNORMAL HIGH (ref 83–108)

## 2022-10-31 LAB — PROTIME-INR
INR: 1.6 — ABNORMAL HIGH (ref 0.8–1.2)
Prothrombin Time: 19.2 seconds — ABNORMAL HIGH (ref 11.4–15.2)

## 2022-10-31 LAB — RESP PANEL BY RT-PCR (RSV, FLU A&B, COVID)  RVPGX2
Influenza A by PCR: NEGATIVE
Influenza B by PCR: NEGATIVE
Resp Syncytial Virus by PCR: NEGATIVE
SARS Coronavirus 2 by RT PCR: NEGATIVE

## 2022-10-31 LAB — BRAIN NATRIURETIC PEPTIDE: B Natriuretic Peptide: 140 pg/mL — ABNORMAL HIGH (ref 0.0–100.0)

## 2022-10-31 LAB — I-STAT CHEM 8, ED
BUN: 72 mg/dL — ABNORMAL HIGH (ref 8–23)
Calcium, Ion: 1.03 mmol/L — ABNORMAL LOW (ref 1.15–1.40)
Chloride: 130 mmol/L — ABNORMAL HIGH (ref 98–111)
Creatinine, Ser: 2.5 mg/dL — ABNORMAL HIGH (ref 0.44–1.00)
Glucose, Bld: 151 mg/dL — ABNORMAL HIGH (ref 70–99)
HCT: 52 % — ABNORMAL HIGH (ref 36.0–46.0)
Hemoglobin: 17.7 g/dL — ABNORMAL HIGH (ref 12.0–15.0)
Potassium: 3.2 mmol/L — ABNORMAL LOW (ref 3.5–5.1)
Sodium: 165 mmol/L (ref 135–145)
TCO2: 20 mmol/L — ABNORMAL LOW (ref 22–32)

## 2022-10-31 LAB — OSMOLALITY: Osmolality: 379 mOsm/kg (ref 275–295)

## 2022-10-31 LAB — COOXEMETRY PANEL
Carboxyhemoglobin: 1.6 % — ABNORMAL HIGH (ref 0.5–1.5)
Methemoglobin: <0.7 % (ref 0.0–1.5)
O2 Saturation: 72.1 %
Total hemoglobin: 14.1 g/dL (ref 12.0–16.0)

## 2022-10-31 LAB — SALICYLATE LEVEL: Salicylate Lvl: <7 mg/dL — ABNORMAL LOW (ref 7.0–30.0)

## 2022-10-31 LAB — HEMOGLOBIN A1C
Hgb A1c MFr Bld: 5.1 % (ref 4.8–5.6)
Mean Plasma Glucose: 99.67 mg/dL

## 2022-10-31 LAB — ACETAMINOPHEN LEVEL: Acetaminophen (Tylenol), Serum: <10 ug/mL — ABNORMAL LOW (ref 10–30)

## 2022-10-31 LAB — VITAMIN B12: Vitamin B-12: 1314 pg/mL — ABNORMAL HIGH (ref 180–914)

## 2022-10-31 LAB — AMMONIA: Ammonia: 33 umol/L (ref 9–35)

## 2022-10-31 LAB — CK TOTAL AND CKMB (NOT AT ARMC)
CK, MB: 2.4 ng/mL (ref 0.5–5.0)
Total CK: 307 U/L — ABNORMAL HIGH (ref 38–234)

## 2022-10-31 LAB — CK: Total CK: 309 U/L — ABNORMAL HIGH (ref 38–234)

## 2022-10-31 LAB — CORTISOL: Cortisol, Plasma: >100 ug/dL

## 2022-10-31 LAB — LACTIC ACID, PLASMA
Lactic Acid, Venous: 4.4 mmol/L (ref 0.5–1.9)
Lactic Acid, Venous: 4.1 mmol/L (ref 0.5–1.9)
Lactic Acid, Venous: 2.4 mmol/L (ref 0.5–1.9)

## 2022-10-31 LAB — T4, FREE: Free T4: 1.03 ng/dL (ref 0.61–1.12)

## 2022-10-31 LAB — TSH: TSH: 2.926 u[IU]/mL (ref 0.350–4.500)

## 2022-10-31 LAB — STREP PNEUMONIAE URINARY ANTIGEN: Strep Pneumo Urinary Antigen: NEGATIVE

## 2022-10-31 LAB — MRSA NEXT GEN BY PCR, NASAL: MRSA by PCR Next Gen: DETECTED — AB

## 2022-10-31 LAB — COMPREHENSIVE METABOLIC PANEL
ALT: 457 U/L — ABNORMAL HIGH (ref 0–44)
Glucose, Bld: 152 mg/dL — ABNORMAL HIGH (ref 70–99)

## 2022-10-31 LAB — ETHANOL: Alcohol, Ethyl (B): <10 mg/dL (ref <10)

## 2022-10-31 LAB — GLUCOSE, CAPILLARY: Glucose-Capillary: 175 mg/dL — ABNORMAL HIGH (ref 70–99)

## 2022-10-31 LAB — CBG MONITORING, ED: Glucose-Capillary: 123 mg/dL — ABNORMAL HIGH (ref 70–99)

## 2022-10-31 LAB — PROCALCITONIN: Procalcitonin: 13.55 ng/mL

## 2022-10-31 MED ORDER — ORAL CARE MOUTH RINSE
15.0000 mL | OROMUCOSAL | Status: DC
  Administered 2022-10-31 – 2022-11-06 (×65): 15 mL via OROMUCOSAL

## 2022-10-31 MED ORDER — PENTAFLUOROPROP-TETRAFLUOROETH EX AERO
1.0000 | INHALATION_SPRAY | Freq: Four times a day (QID) | CUTANEOUS | Status: DC | PRN
  Administered 2022-11-01 – 2022-11-02 (×3): 1 via TOPICAL
  Filled 2022-10-31: qty 116

## 2022-10-31 MED ORDER — PROPOFOL 1000 MG/100ML IV EMUL
INTRAVENOUS | Status: AC
  Filled 2022-10-31: qty 100

## 2022-10-31 MED ORDER — MUPIROCIN 2 % EX OINT
1.0000 | TOPICAL_OINTMENT | Freq: Two times a day (BID) | CUTANEOUS | Status: AC
  Administered 2022-11-01 – 2022-11-05 (×10): 1 via NASAL
  Filled 2022-10-31: qty 22

## 2022-10-31 MED ORDER — VANCOMYCIN HCL IN DEXTROSE 1-5 GM/200ML-% IV SOLN: 1000.0000 mg | Freq: Once | INTRAVENOUS | Status: DC

## 2022-10-31 MED ORDER — METRONIDAZOLE 500 MG/100ML IV SOLN
500.0000 mg | Freq: Two times a day (BID) | INTRAVENOUS | Status: DC
  Administered 2022-11-01: 500 mg via INTRAVENOUS
  Filled 2022-10-31: qty 100

## 2022-10-31 MED ORDER — ORAL CARE MOUTH RINSE: 15.0000 mL | OROMUCOSAL | Status: DC | PRN

## 2022-10-31 MED ORDER — FAMOTIDINE IN NACL 20-0.9 MG/50ML-% IV SOLN
20.0000 mg | Freq: Two times a day (BID) | INTRAVENOUS | Status: DC
  Administered 2022-10-31 – 2022-11-01 (×2): 20 mg via INTRAVENOUS
  Filled 2022-10-31 (×2): qty 50

## 2022-10-31 MED ORDER — VANCOMYCIN HCL 1500 MG/300ML IV SOLN
1500.0000 mg | Freq: Once | INTRAVENOUS | Status: AC
  Administered 2022-10-31: 1500 mg via INTRAVENOUS
  Filled 2022-10-31: qty 300

## 2022-10-31 MED ORDER — POLYETHYLENE GLYCOL 3350 17 G PO PACK: 17.0000 g | PACK | Freq: Every day | ORAL | Status: DC | PRN

## 2022-10-31 MED ORDER — VANCOMYCIN VARIABLE DOSE PER UNSTABLE RENAL FUNCTION (PHARMACIST DOSING): Status: DC

## 2022-10-31 MED ORDER — SODIUM CHLORIDE 0.9 % IV SOLN: 2.0000 g | INTRAVENOUS | Status: DC

## 2022-10-31 MED ORDER — FENTANYL 2500MCG IN NS 250ML (10MCG/ML) PREMIX INFUSION
0.0000 ug/h | INTRAVENOUS | Status: DC
  Administered 2022-10-31: 50 ug/h via INTRAVENOUS
  Filled 2022-10-31: qty 250

## 2022-10-31 MED ORDER — SODIUM CHLORIDE 0.9 % IV SOLN
2.0000 g | Freq: Once | INTRAVENOUS | Status: AC
  Administered 2022-10-31: 2 g via INTRAVENOUS
  Filled 2022-10-31: qty 12.5

## 2022-10-31 MED ORDER — FENTANYL CITRATE PF 50 MCG/ML IJ SOSY
25.0000 ug | PREFILLED_SYRINGE | INTRAMUSCULAR | Status: DC | PRN
  Administered 2022-10-31 (×2): 50 ug via INTRAVENOUS
  Administered 2022-11-01 (×4): 100 ug via INTRAVENOUS
  Administered 2022-11-01: 50 ug via INTRAVENOUS
  Administered 2022-11-01: 100 ug via INTRAVENOUS
  Administered 2022-11-02: 25 ug via INTRAVENOUS
  Administered 2022-11-03 (×2): 100 ug via INTRAVENOUS
  Administered 2022-11-03 – 2022-11-04 (×5): 50 ug via INTRAVENOUS
  Filled 2022-10-31 (×5): qty 1
  Filled 2022-10-31 (×3): qty 2
  Filled 2022-10-31 (×3): qty 1
  Filled 2022-10-31: qty 2

## 2022-10-31 MED ORDER — CHLORHEXIDINE GLUCONATE CLOTH 2 % EX PADS
6.0000 | MEDICATED_PAD | Freq: Every day | CUTANEOUS | Status: DC
  Administered 2022-10-31 – 2022-11-18 (×18): 6 via TOPICAL

## 2022-10-31 MED ORDER — METRONIDAZOLE 500 MG/100ML IV SOLN: 500.0000 mg | Freq: Two times a day (BID) | INTRAVENOUS | Status: DC

## 2022-10-31 MED ORDER — ETOMIDATE 2 MG/ML IV SOLN
INTRAVENOUS | Status: DC | PRN
  Administered 2022-10-31: 20 mg via INTRAVENOUS

## 2022-10-31 MED ORDER — METRONIDAZOLE 500 MG/100ML IV SOLN
500.0000 mg | Freq: Once | INTRAVENOUS | Status: AC
  Administered 2022-10-31: 500 mg via INTRAVENOUS
  Filled 2022-10-31: qty 100

## 2022-10-31 MED ORDER — LACTATED RINGERS IV BOLUS
1000.0000 mL | Freq: Once | INTRAVENOUS | Status: AC
  Administered 2022-10-31: 1000 mL via INTRAVENOUS

## 2022-10-31 MED ORDER — POTASSIUM CHLORIDE 10 MEQ/50ML IV SOLN
10.0000 meq | INTRAVENOUS | Status: AC
  Administered 2022-11-01 (×3): 10 meq via INTRAVENOUS
  Filled 2022-10-31 (×3): qty 50

## 2022-10-31 MED ORDER — POLYETHYLENE GLYCOL 3350 17 G PO PACK: 17.0000 g | PACK | Freq: Every day | ORAL | Status: DC

## 2022-10-31 MED ORDER — FAMOTIDINE 20 MG PO TABS: 20.0000 mg | ORAL_TABLET | Freq: Two times a day (BID) | ORAL | Status: DC

## 2022-10-31 MED ORDER — SODIUM CHLORIDE 0.9 % IV BOLUS
1000.0000 mL | Freq: Once | INTRAVENOUS | Status: AC
  Administered 2022-10-31: 1000 mL via INTRAVENOUS

## 2022-10-31 MED ORDER — FENTANYL CITRATE PF 50 MCG/ML IJ SOSY
25.0000 ug | PREFILLED_SYRINGE | INTRAMUSCULAR | Status: DC | PRN
  Filled 2022-10-31: qty 1

## 2022-10-31 MED ORDER — PROPOFOL 10 MG/ML IV BOLUS
INTRAVENOUS | Status: DC | PRN
  Administered 2022-10-31: 10 ug via INTRAVENOUS

## 2022-10-31 MED ORDER — DEXTROSE-SODIUM CHLORIDE 5-0.45 % IV SOLN
INTRAVENOUS | Status: DC
  Administered 2022-11-01: 150 mL/h via INTRAVENOUS

## 2022-10-31 MED ORDER — INSULIN ASPART 100 UNIT/ML IJ SOLN
0.0000 [IU] | Freq: Three times a day (TID) | INTRAMUSCULAR | Status: DC
  Administered 2022-11-01: 1 [IU] via SUBCUTANEOUS
  Administered 2022-11-01: 2 [IU] via SUBCUTANEOUS

## 2022-10-31 MED ORDER — LACTATED RINGERS IV BOLUS: 500.0000 mL | Freq: Once | INTRAVENOUS | Status: DC

## 2022-10-31 MED ORDER — DOCUSATE SODIUM 100 MG PO CAPS: 100.0000 mg | ORAL_CAPSULE | Freq: Two times a day (BID) | ORAL | Status: DC | PRN

## 2022-10-31 MED ORDER — ROCURONIUM BROMIDE 50 MG/5ML IV SOLN
INTRAVENOUS | Status: DC | PRN
  Administered 2022-10-31: 60 mg via INTRAVENOUS

## 2022-10-31 MED ORDER — DOCUSATE SODIUM 50 MG/5ML PO LIQD
100.0000 mg | Freq: Two times a day (BID) | ORAL | Status: DC
  Administered 2022-11-01 – 2022-11-04 (×5): 100 mg
  Filled 2022-10-31 (×5): qty 10

## 2022-10-31 NOTE — ED Triage Notes (Signed)
Pt came to ED by [facility]. Pt was found down in a field flat on her back in a butterfly position. Unknown how long pt was found down. Pt GCS 3, pt arrives on [facility].

## 2022-10-31 NOTE — ED Notes (Signed)
Bilateral leg bruising on knee area
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
## 2022-10-31 NOTE — ED Notes (Signed)
RN unsuccessful with 2nd set of blood cultures.

## 2022-10-31 NOTE — Progress Notes (Signed)
Pt transported from [facility] to 2M07 with no complications.

## 2022-10-31 NOTE — ED Notes (Signed)
ED TO INPATIENT HANDOFF REPORT  ED Nurse Name and Phone #: Kathryne Hitch [phone]  S Name/Age/Gender Nicole [name] 47 y.o. female Room/Bed: TRAAC/TRAAC  Code Status   Code Status: Full Code  Home/SNF/Other Patient is homeless, Nicole [name] Patient oriented to: Is this baseline?     Triage Complete: Triage complete  Chief Complaint Acute respiratory failure (HCC) [J96.00]  Triage Note Pt came to ED by [facility]. Pt was found down in a field flat on her back in a butterfly position. Unknown how long pt was found down. Pt GCS 3, pt arrives on NRB.    Allergies Not on File  Level of Care/Admitting Diagnosis ED Disposition     ED Disposition  Admit   Condition  --   Comment  Hospital [facility]: [facility] [[id number]]  Level of Care: ICU [6]  May admit patient to [facility] or [facility] if equivalent level of care is available:: No  Covid Evaluation: Confirmed COVID Negative  Diagnosis: Acute respiratory failure (HCC) [518.81.ICD-9-CM]  Admitting Physician: Lynnell Catalan [[id number]]  Attending Physician: Lynnell Catalan [phone]  Certification:: I certify this patient will need inpatient services for at least 2 midnights  Estimated Length of Stay: 2          B Medical/Surgery History History reviewed. No pertinent past medical history. History reviewed. No pertinent surgical history.   A IV Location/Drains/Wounds Patient Lines/Drains/Airways Status     Active Line/Drains/Airways     Name Placement date Placement time Site Days   Peripheral IV 10/31/22 20 G Left Forearm 10/31/22  1517  Forearm  less than 1   CVC Triple Lumen 10/31/22 Right Internal jugular 10/31/22  1715  -- less than 1   NG/OG Vented/Dual Lumen 16 Fr. Oral 60 cm 10/31/22  1545  Oral  less than 1   Urethral Catheter Tori 10/31/22  1546  --  less than 1   Urethral Catheter Tori 10/31/22  1548  --  less than 1   Airway 7.5 mm 10/31/22  1500  -- less than 1            Intake/Output  Last 24 hours No intake or output data in the 24 hours ending 10/31/22 1956  Labs/Imaging Results for orders placed or performed during the hospital encounter of 10/31/22 (from the past 48 hour(s))  Comprehensive metabolic panel     Status: Abnormal   Collection Time: 10/31/22  3:00 PM  Result Value Ref Range   Sodium 161 (HH) 135 - 145 mmol/L    Comment: CRITICAL RESULT CALLED TO, READ BACK BY AND VERIFIED WITH T.VALDEZ,RN @1647  10/31/2022 VANG.J   Potassium 3.5 3.5 - 5.1 mmol/L   Chloride 124 (H) 98 - 111 mmol/L   CO2 21 (L) 22 - 32 mmol/L   Glucose, Bld 152 (H) 70 - 99 mg/dL    Comment: Glucose reference range applies only to samples taken after fasting for at least 8 hours.   BUN 79 (H) 8 - 23 mg/dL   Creatinine, Ser 9.56 (H) 0.44 - 1.00 mg/dL   Calcium 8.9 8.9 - 21.3 mg/dL   Total Protein 8.0 6.5 - 8.1 g/dL   Albumin 2.6 (L) 3.5 - 5.0 g/dL   AST 086 (H) 15 - 41 U/L   ALT 457 (H) 0 - 44 U/L   Alkaline Phosphatase 90 38 - 126 U/L   Total Bilirubin 2.2 (H) 0.3 - 1.2 mg/dL   GFR, Estimated 12 (L) >60 mL/min    Comment: (  NOTE) Calculated using the CKD-EPI Creatinine Equation (2021)    Anion gap 16 (H) 5 - 15    Comment: Performed at [facility], [address], [place], [place] [address]  CBC with Differential/Platelet     Status: Abnormal   Collection Time: 10/31/22  3:00 PM  Result Value Ref Range   WBC 43.6 (H) 4.0 - 10.5 K/uL   RBC 5.58 (H) 3.87 - 5.11 MIL/uL   Hemoglobin 18.0 (H) 12.0 - 15.0 g/dL   HCT 51.8 (H) 84.1 - 66.0 %   MCV 98.0 80.0 - 100.0 fL   MCH 32.3 26.0 - 34.0 pg   MCHC 32.9 30.0 - 36.0 g/dL   RDW 63.0 16.0 - 10.9 %   Platelets 272 150 - 400 K/uL   nRBC 0.2 0.0 - 0.2 %   Neutrophils Relative % 91 %   Neutro Abs 39.7 (H) 1.7 - 7.7 K/uL   Lymphocytes Relative 4 %   Lymphs Abs 1.8 0.7 - 4.0 K/uL   Monocytes Relative 4 %   Monocytes Absolute 1.6 (H) 0.1 - 1.0 K/uL   Eosinophils Relative 0 %   Eosinophils Absolute 0.0 0.0 - 0.5 K/uL   Basophils  Relative 0 %   Basophils Absolute 0.0 0.0 - 0.1 K/uL   Immature Granulocytes 1 %   Abs Immature Granulocytes 0.50 (H) 0.00 - 0.07 K/uL    Comment: Performed at [facility], [address], [place], [place] [address]  Lactic acid, plasma     Status: Abnormal   Collection Time: 10/31/22  3:00 PM  Result Value Ref Range   Lactic Acid, Venous 4.4 (HH) 0.5 - 1.9 mmol/L    Comment: CRITICAL RESULT CALLED TO, READ BACK BY AND VERIFIED WITH T.VALDEZ,RN @1737  10/31/2022 VANG.J Performed at [facility], [address], [place], [place] [address]   CK     Status: Abnormal   Collection Time: 10/31/22  3:00 PM  Result Value Ref Range   Total CK 309 (H) 38 - 234 U/L    Comment: Performed at [facility], [address], [place], [place] [address]  T4, free     Status: None   Collection Time: 10/31/22  3:00 PM  Result Value Ref Range   Free T4 1.03 0.61 - 1.12 ng/dL    Comment: (NOTE) Biotin ingestion may interfere with free T4 tests. If the results are inconsistent with the TSH level, previous test results, or the clinical presentation, then consider biotin interference. If needed, order repeat testing after stopping biotin. Performed at [facility], [address], [place], [place] [address]   Osmolality     Status: Abnormal   Collection Time: 10/31/22  3:00 PM  Result Value Ref Range   Osmolality 379 (HH) 275 - 295 mOsm/kg    Comment: REPEATED TO VERIFY CRITICAL RESULT CALLED TO, READ BACK BY AND VERIFIED WITH: Karilyn Cota, RN AT 1615 06.18.24 D. BLU Performed at [facility], [address] [place], [place] [address]   CK total and CKMB (cardiac)not at [facility]     Status: Abnormal   Collection Time: 10/31/22  3:00 PM  Result Value Ref Range   Total CK 307 (H) 38 - 234 U/L   CK, MB 2.4 0.5 - 5.0 ng/mL    Comment: Performed at [facility], [address], [place], [place] [address]  Protime-INR     Status: Abnormal   Collection Time: 10/31/22  3:00  PM  Result Value Ref Range   Prothrombin Time 19.2 (H) 11.4 - 15.2 seconds   INR 1.6 (H) 0.8 - 1.2    Comment: (NOTE) INR goal varies based on device and disease states. Performed at [facility], [address], [place], [place] [address]   Vitamin B12     Status: Abnormal   Collection Time: 10/31/22  3:00 PM  Result Value Ref Range   Vitamin B-12 1,314 (H) 180 - 914 pg/mL    Comment: (NOTE) This assay is not validated for testing neonatal or myeloproliferative syndrome specimens for Vitamin B12 levels. Performed at [facility], [address], [place], [place] [address]   I-stat chem 8, ED (not at [facility] or [facility])     Status: Abnormal   Collection Time: 10/31/22  3:06 PM  Result Value Ref Range   Sodium 165 (HH) 135 - 145 mmol/L   Potassium 3.2 (L) 3.5 - 5.1 mmol/L   Chloride 130 (H) 98 - 111 mmol/L   BUN 72 (H) 8 - 23 mg/dL   Creatinine, Ser 2.84 (H) 0.44 - 1.00 mg/dL   Glucose, Bld 132 (H) 70 - 99 mg/dL    Comment: Glucose reference range applies only to samples taken after fasting for at least 8 hours.   Calcium, Ion 1.03 (L) 1.15 - 1.40 mmol/L   TCO2 20 (L) 22 - 32 mmol/L   Hemoglobin 17.7 (H) 12.0 - 15.0 g/dL   HCT 44.0 (H) 10.2 - 72.5 %   Comment NOTIFIED PHYSICIAN   TSH     Status: None   Collection Time: 10/31/22  3:30 PM  Result Value Ref Range   TSH 2.926 0.350 - 4.500 uIU/mL    Comment: Performed by a 3rd Generation assay with a functional sensitivity of <=0.01 uIU/mL. Performed at  [facility], [address], [place], [place] [address]   Brain natriuretic peptide     Status: Abnormal   Collection Time: 10/31/22  3:30 PM  Result Value Ref Range   B Natriuretic Peptide 140.0 (H) 0.0 - 100.0 pg/mL    Comment: Performed at [facility], [address], [place], [place] [address]  I-Stat arterial blood gas, ED     Status: Abnormal   Collection Time: 10/31/22  3:36 PM  Result Value Ref Range   pH, Arterial 7.387 7.35 - 7.45   pCO2  arterial 38.5 32 - 48 mmHg   pO2, Arterial 365 (H) 83 - 108 mmHg   Bicarbonate 23.0 20.0 - 28.0 mmol/L   TCO2 24 22 - 32 mmol/L   O2 Saturation 100 %   Acid-base deficit 2.0 0.0 - 2.0 mmol/L   Sodium 164 (HH) 135 - 145 mmol/L   Potassium 3.1 (L) 3.5 - 5.1 mmol/L   Calcium, Ion 1.14 (L) 1.15 - 1.40 mmol/L   HCT 45.0 36.0 - 46.0 %   Hemoglobin 15.3 (H) 12.0 - 15.0 g/dL   Patient temperature 25.9 F    Collection site RADIAL, ALLEN'S TEST ACCEPTABLE    Drawn by RT    Sample type ARTERIAL    Comment NOTIFIED PHYSICIAN   CBG monitoring, ED     Status: Abnormal   Collection Time: 10/31/22  3:53 PM  Result Value Ref Range   Glucose-Capillary 123 (H) 70 - 99 mg/dL    Comment: Glucose reference range applies only to samples taken after fasting for at least 8 hours.  Resp panel by RT-PCR (RSV,  Flu A&B, Covid) Anterior Nasal Swab     Status: None   Collection Time: 10/31/22  4:02 PM   Specimen: Anterior Nasal Swab  Result Value Ref Range   SARS Coronavirus 2 by RT PCR NEGATIVE NEGATIVE   Influenza A by PCR NEGATIVE NEGATIVE   Influenza B by PCR NEGATIVE NEGATIVE    Comment: (NOTE) The Xpert Xpress SARS-CoV-2/FLU/RSV plus assay is intended as an aid in the diagnosis of influenza from Nasopharyngeal swab specimens and should not be used as a sole basis for treatment. Nasal washings and aspirates are unacceptable for Xpert Xpress SARS-CoV-2/FLU/RSV testing.  Fact Sheet for Patients: BloggerCourse.com  Fact Sheet for Healthcare Providers: SeriousBroker.it  This test is not yet approved or cleared by the [place] FDA and has been authorized for detection and/or diagnosis of SARS-CoV-2 by FDA under an Emergency Use Authorization (EUA). This EUA will remain in effect (meaning this test can be used) for the duration of the COVID-19 declaration under Section 564(b)(1) of the Act, 21 U.S.C. section 360bbb-3(b)(1), unless the authorization  is terminated or revoked.     Resp Syncytial Virus by PCR NEGATIVE NEGATIVE    Comment: (NOTE) Fact Sheet for Patients: BloggerCourse.com  Fact Sheet for Healthcare Providers: SeriousBroker.it  This test is not yet approved or cleared by the [place] FDA and has been authorized for detection and/or diagnosis of SARS-CoV-2 by FDA under an Emergency Use Authorization (EUA). This EUA will remain in effect (meaning this test can be used) for the duration of the COVID-19 declaration under Section 564(b)(1) of the Act, 21 U.S.C. section 360bbb-3(b)(1), unless the authorization is terminated or revoked.  Performed at [facility], [address] [place], [place] [address]   Rapid urine drug screen (hospital performed)     Status: Abnormal   Collection Time: 10/31/22  4:18 PM  Result Value Ref Range   Opiates NONE DETECTED NONE DETECTED   Cocaine POSITIVE (A) NONE DETECTED   Benzodiazepines NONE DETECTED NONE DETECTED   Amphetamines NONE DETECTED NONE DETECTED   Tetrahydrocannabinol NONE DETECTED NONE DETECTED   Barbiturates NONE DETECTED NONE DETECTED    Comment: (NOTE) DRUG SCREEN FOR MEDICAL PURPOSES ONLY.  IF CONFIRMATION IS NEEDED FOR ANY PURPOSE, NOTIFY LAB WITHIN 5 DAYS.  LOWEST DETECTABLE LIMITS FOR URINE DRUG SCREEN Drug Class                     Cutoff (ng/mL) Amphetamine and metabolites    1000 Barbiturate and metabolites    200 Benzodiazepine                 200 Opiates and metabolites        300 Cocaine and metabolites        300 THC                            50 Performed at [facility], [address], [place], [place] [address]   Urinalysis, Routine w reflex microscopic -Urine, Clean Catch     Status: Abnormal   Collection Time: 10/31/22  4:18 PM  Result Value Ref Range   Color, Urine AMBER (A) YELLOW    Comment: BIOCHEMICALS MAY BE AFFECTED BY COLOR   APPearance TURBID (A) CLEAR    Specific Gravity, Urine 1.024 1.005 - 1.030   pH 5.0 5.0 - 8.0   Glucose, UA NEGATIVE NEGATIVE mg/dL   Hgb urine dipstick MODERATE (A)  NEGATIVE   Bilirubin Urine NEGATIVE NEGATIVE   Ketones, ur NEGATIVE NEGATIVE mg/dL   Protein, ur 161 (A) NEGATIVE mg/dL   Nitrite NEGATIVE NEGATIVE   Leukocytes,Ua MODERATE (A) NEGATIVE   RBC / HPF >50 0 - 5 RBC/hpf   WBC, UA >50 0 - 5 WBC/hpf   Bacteria, UA MANY (A) NONE SEEN   Squamous Epithelial / HPF 6-10 0 - 5 /HPF   WBC Clumps PRESENT    Mucus PRESENT    Hyaline Casts, UA PRESENT     Comment: Performed at [facility], [address], [place], [place] [address]  Lactic acid, plasma     Status: Abnormal   Collection Time: 10/31/22  6:09 PM  Result Value Ref Range   Lactic Acid, Venous 4.1 (HH) 0.5 - 1.9 mmol/L    Comment: CRITICAL VALUE NOTED.  VALUE IS CONSISTENT WITH PREVIOUSLY REPORTED AND CALLED VALUE. Performed at [facility], [address], [place], [place] [address]   Ammonia     Status: None   Collection Time: 10/31/22  6:35 PM  Result Value Ref Range   Ammonia 33 9 - 35 umol/L    Comment: Performed at [facility], [address], [place], [place] [address]  Ethanol     Status: None   Collection Time: 10/31/22  6:35 PM  Result Value Ref Range   Alcohol, Ethyl (B) <10 <10 mg/dL    Comment: (NOTE) Lowest detectable limit for serum alcohol is 10 mg/dL.  For medical purposes only. Performed at [facility], [address], [place], [place] [address]   Acetaminophen level     Status: Abnormal   Collection Time: 10/31/22  6:35 PM  Result Value Ref Range   Acetaminophen (Tylenol), Serum <10 (L) 10 - 30 ug/mL    Comment: (NOTE) Therapeutic concentrations vary significantly. A range of 10-30 ug/mL  may be an effective concentration for many patients. However, some  are best treated at concentrations outside of this range. Acetaminophen concentrations >150 ug/mL at 4 hours after ingestion  and >50 ug/mL at  12 hours after ingestion are often associated with  toxic reactions.  Performed at [facility], [address], [place], [place] [address]   Salicylate level     Status: Abnormal   Collection Time: 10/31/22  6:35 PM  Result Value Ref Range   Salicylate Lvl <7.0 (L) 7.0 - 30.0 mg/dL    Comment: Performed at [facility], [address] [place], [place] [address]  Hemoglobin A1c     Status: None   Collection Time: 10/31/22  7:07 PM  Result Value Ref Range   Hgb A1c MFr Bld 5.1 4.8 - 5.6 %    Comment: (NOTE) Pre diabetes:          5.7%-6.4%  Diabetes:              >6.4%  Glycemic control for   <7.0% adults with diabetes    Mean Plasma Glucose 99.67 mg/dL    Comment: Performed at [facility], [address], [place], [place] [address]   DG Chest Portable 1 View  Result Date: 10/31/2022 CLINICAL DATA:  Line placement EXAM: PORTABLE CHEST 1 VIEW COMPARISON:  X-ray 10/31/2022 and CT. FINDINGS: Hyperinflation. Persistent lung base opacities, right-greater-than-left. Trace right effusion. No pneumothorax or edema. Stable ET tube and enteric tube. New right IJ line with tip along the central SVC. IMPRESSION: New right IJ line.  No pneumothorax. Stable ET tube and enteric tube. Persistent lung base opacities, right-greater-than-left with a tiny right effusion. Previous lucency along left hemithorax is not persist. No definite pneumothorax on the current x-ray. Electronically Signed   By: Karen Kays M.D.   On: 10/31/2022 18:00   CT CHEST ABDOMEN PELVIS WO CONTRAST  Result Date: 10/31/2022 CLINICAL DATA:  Found down EXAM: CT CHEST, ABDOMEN AND PELVIS WITHOUT CONTRAST TECHNIQUE: Multidetector CT imaging of the chest, abdomen and pelvis was performed following the standard protocol without IV contrast. RADIATION DOSE REDUCTION: This exam was performed according to the departmental dose-optimization program which includes automated exposure control, adjustment of the mA and/or  kV according to patient size and/or use of iterative reconstruction technique. COMPARISON:  None Available. FINDINGS: CT CHEST FINDINGS Cardiovascular: Normal heart size. No esophagus and thyroid are unremarkable. Pericardial effusion. Normal caliber thoracic aorta with no atherosclerotic disease. Mediastinum/Nodes: Esophagus and thyroid are unremarkable. No enlarged lymph nodes seen in the chest. Lungs/Pleura: Central airways are patent. Debris seen in the right lower lobe segmental bronchi. Right-greater-than-left lower lobe consolidations. Focal areas of bubbly lucency seen within the right lower lobe consolidation. Numerous centrilobular nodules and ground-glass opacities which are most pronounced in the bilateral lower lobes and posterior portions of the right middle lobe and bilateral upper lobes. Trace right pleural effusion. Musculoskeletal: No chest wall mass or suspicious bone lesions identified. CT ABDOMEN PELVIS FINDINGS Streak artifact related to patient positioning somewhat limits evaluation. Hepatobiliary: No focal liver abnormality. Gallbladder is unremarkable. Pancreas: Visualized portions are unremarkable. Spleen: Normal in size without focal abnormality. Adrenals/Urinary Tract: Bilateral adrenal glands are unremarkable. No hydronephrosis or nephrolithiasis. Bladder is partially decompressed and contains a Foley catheter. Stomach/Bowel: Enteric tube tip is in the distal stomach. Stomach is within normal limits. No evidence of bowel wall thickening, distention, or inflammatory changes. Vascular/Lymphatic: No significant vascular findings are present. No enlarged abdominal or pelvic lymph nodes. Reproductive: IUD in place.  No adnexal mass. Other: No abdominal wall hernia or abnormality. No abdominopelvic ascites. Musculoskeletal: Superior endplate lucency of L5, likely a Schmorl's node. No aggressive appearing osseous lesions. IMPRESSION: 1. Right-greater-than-left lower lobe consolidations with  numerous associated centrilobular nodules and ground-glass opacities. Findings are most consistent with aspiration pneumonia. Focal areas of bubbly lucency are seen within the right lower lobe consolidation, which are concerning for cavitary infection or associated pulmonary infarct. Consider chest CTA to exclude pulmonary embolus. 2. Trace right pleural effusion.  No pneumothorax. 3. Evaluation of the abdomen and pelvis is limited due to lack of IV contrast and streak artifact related to patient positioning. Within limitations, no acute abnormality of the abdomen or pelvis. Electronically Signed   By: Allegra Lai M.D.   On: 10/31/2022 17:11   CT HEAD WO CONTRAST  Result Date: 10/31/2022 CLINICAL DATA:  Head trauma, minor (Age >= 65y); ams abnormal edema within bilateral EXAM: CT HEAD WITHOUT CONTRAST CT CERVICAL SPINE WITHOUT CONTRAST TECHNIQUE: Multidetector CT imaging of the head and cervical spine was performed following the standard protocol without intravenous contrast. Multiplanar CT image reconstructions of the cervical spine were also generated. RADIATION DOSE REDUCTION: This exam was performed according to the departmental dose-optimization program which includes automated exposure control, adjustment of the mA and/or kV according to patient size and/or use of iterative reconstruction technique. COMPARISON:  None Available. FINDINGS: CT HEAD FINDINGS Brain: Abnormal symmetric edema within bilateral globus palladi. No evidence of acute hemorrhage, mass lesion, midline shift or hydrocephalus. Trace/equivocal hyperdensity along the high bilateral  frontal sulci. No mass occupying acute hemorrhage. No hydrocephalus or herniation. Vascular: No hyperdense vessel identified. Skull: No acute fracture. Sinuses/Orbits: Clear sinuses.  No acute orbital findings. Other: No mastoid effusions. CT CERVICAL SPINE FINDINGS Alignment: Rotation of C1 on C2. No substantial sagittal subluxation. Skull base and  vertebrae: Vertebral body heights are maintained. No evidence of acute fracture Soft tissues and spinal canal: No prevertebral fluid or swelling. No visible canal hematoma. Disc levels: Mild to moderate multilevel bony degenerative change. Upper chest: Visualized lung apices are clear. IMPRESSION: 1. Abnormal symmetric edema within bilateral globus palladi concerning for acute hypoxic/ischemic or metabolic/toxic injury. Recommend MRI head to further assess. 2. Trace/equivocal hyperdensity along the high bilateral frontal sulci could be artifactual or trace subarachnoid hemorrhage. No mass effect. Recommend attention on the forthcoming MRI. If MRI is not obtained, recommend follow-up CT to ensure stability. 3. Rotation of C1 on C2, most likely positional in the absence of a fixed torticollis. 4. No evidence of acute fracture in the cervical spine. Electronically Signed   By: Feliberto Harts M.D.   On: 10/31/2022 17:00   CT Cervical Spine Wo Contrast  Result Date: 10/31/2022 CLINICAL DATA:  Head trauma, minor (Age >= 65y); ams abnormal edema within bilateral EXAM: CT HEAD WITHOUT CONTRAST CT CERVICAL SPINE WITHOUT CONTRAST TECHNIQUE: Multidetector CT imaging of the head and cervical spine was performed following the standard protocol without intravenous contrast. Multiplanar CT image reconstructions of the cervical spine were also generated. RADIATION DOSE REDUCTION: This exam was performed according to the departmental dose-optimization program which includes automated exposure control, adjustment of the mA and/or kV according to patient size and/or use of iterative reconstruction technique. COMPARISON:  None Available. FINDINGS: CT HEAD FINDINGS Brain: Abnormal symmetric edema within bilateral globus palladi. No evidence of acute hemorrhage, mass lesion, midline shift or hydrocephalus. Trace/equivocal hyperdensity along the high bilateral frontal sulci. No mass occupying acute hemorrhage. No hydrocephalus or  herniation. Vascular: No hyperdense vessel identified. Skull: No acute fracture. Sinuses/Orbits: Clear sinuses.  No acute orbital findings. Other: No mastoid effusions. CT CERVICAL SPINE FINDINGS Alignment: Rotation of C1 on C2. No substantial sagittal subluxation. Skull base and vertebrae: Vertebral body heights are maintained. No evidence of acute fracture Soft tissues and spinal canal: No prevertebral fluid or swelling. No visible canal hematoma. Disc levels: Mild to moderate multilevel bony degenerative change. Upper chest: Visualized lung apices are clear. IMPRESSION: 1. Abnormal symmetric edema within bilateral globus palladi concerning for acute hypoxic/ischemic or metabolic/toxic injury. Recommend MRI head to further assess. 2. Trace/equivocal hyperdensity along the high bilateral frontal sulci could be artifactual or trace subarachnoid hemorrhage. No mass effect. Recommend attention on the forthcoming MRI. If MRI is not obtained, recommend follow-up CT to ensure stability. 3. Rotation of C1 on C2, most likely positional in the absence of a fixed torticollis. 4. No evidence of acute fracture in the cervical spine. Electronically Signed   By: Feliberto Harts M.D.   On: 10/31/2022 17:00   DG Chest Port 1 View  Result Date: 10/31/2022 CLINICAL DATA:  Post intubation EXAM: PORTABLE CHEST 1 VIEW COMPARISON:  None Available. FINDINGS: ET tube in place with the tip 3 cm above the carina. Enteric tube extends beneath the diaphragm. Mild opacity along the medial right lower lobe. Minimal medial left lower lobe. Left-sided apical pneumothorax suspected. No effusion or edema. IMPRESSION: ET tube and enteric tube in place. Small left apical pneumothorax. Recommend further workup such as chest CT. Lung base opacities, right-greater-than-left. Critical Value/emergent  results were called by telephone at the time of interpretation on 10/31/2022 at 12:55 pm to provider Dr. Gayleen Orem, who verbally acknowledged these results.  Electronically Signed   By: Karen Kays M.D.   On: 10/31/2022 16:07    Pending Labs Unresulted Labs (From admission, onward)     Start     Ordered   11/01/22 0500  CBC  Tomorrow morning,   R        10/31/22 1807   11/01/22 0500  Magnesium  Tomorrow morning,   R        10/31/22 1807   11/01/22 0500  Phosphorus  Tomorrow morning,   R        10/31/22 1807   10/31/22 1811  Cooxemetry Panel (carboxy, met, total hgb, O2 sat)  Once,   R        10/31/22 1810   10/31/22 1805  CK total and CKMB (cardiac)not at [facility]  Every 12 hours (non-specified),   R      10/31/22 1807   10/31/22 1805  Culture, Respiratory w Gram Stain (tracheal aspirate)  Once,   R        10/31/22 1807   10/31/22 1805  Strep pneumoniae urinary antigen (not at [facility])  Once,   R        10/31/22 1807   10/31/22 1805  Legionella Pneumophila Serogp 1 Ur Ag  Once,   R        10/31/22 1807   10/31/22 1804  Basic metabolic panel  Now then every 6 hours,   R      10/31/22 1807   10/31/22 1804  Lactic acid, plasma  STAT Now then every 3 hours,   R      10/31/22 1807   10/31/22 1804  Procalcitonin  Once,   R       References:    Procalcitonin Lower Respiratory Tract Infection AND Sepsis Procalcitonin Algorithm   10/31/22 1807   10/31/22 1804  Cortisol  ONCE - URGENT,   URGENT        10/31/22 1807   10/31/22 1459  Blood Culture (Routine X 2)  BLOOD CULTURE X 2,   R      10/31/22 1459            Vitals/Pain Today's Vitals   10/31/22 1845 10/31/22 1900 10/31/22 1921 10/31/22 1938  BP: 130/87 125/84  98/81  Pulse: 68 70 69 70  Resp: (!) 25 (!) 27 (!) 27 (!) 27  Temp: 97.7 F (36.5 C) 98.1 F (36.7 C)  99.7 F (37.6 C)  SpO2: 92% 94% 93% 98%  Weight:      Height:        Isolation Precautions No active isolations  Medications Medications  etomidate (AMIDATE) injection (20 mg Intravenous Given 10/31/22 1450)  rocuronium (ZEMURON) injection (60 mg Intravenous Given 10/31/22 1451)  propofol (DIPRIVAN) 10 mg/mL  bolus/IV push (10 mcg Intravenous Given 10/31/22 1357)  vancomycin variable dose per unstable renal function (pharmacist dosing) (has no administration in time range)  ceFEPIme (MAXIPIME) 2 g in sodium chloride 0.9 % 100 mL IVPB (has no administration in time range)  polyethylene glycol (MIRALAX / GLYCOLAX) packet 17 g (has no administration in time range)  docusate (COLACE) 50 MG/5ML liquid 100 mg (has no administration in time range)  famotidine (PEPCID) IVPB 20 mg premix (has no administration in time range)  dextrose 5 % and 0.45 % NaCl infusion ( Intravenous New Bag/Given 10/31/22 1841)  insulin aspart (novoLOG) injection 0-9 Units (has no administration in time range)  fentaNYL (SUBLIMAZE) injection 25 mcg (has no administration in time range)  fentaNYL (SUBLIMAZE) injection 25-100 mcg (has no administration in time range)  metroNIDAZOLE (FLAGYL) IVPB 500 mg (has no administration in time range)  sodium chloride 0.9 % bolus 1,000 mL (0 mLs Intravenous Stopped 10/31/22 1647)  lactated ringers bolus 1,000 mL (0 mLs Intravenous Stopped 10/31/22 1821)  ceFEPIme (MAXIPIME) 2 g in sodium chloride 0.9 % 100 mL IVPB (0 g Intravenous Stopped 10/31/22 1620)  metroNIDAZOLE (FLAGYL) IVPB 500 mg (0 mg Intravenous Stopped 10/31/22 1647)  vancomycin (VANCOREADY) IVPB 1500 mg/300 mL (0 mg Intravenous Stopped 10/31/22 1943)  lactated ringers bolus 1,000 mL (0 mLs Intravenous Stopped 10/31/22 1846)    Mobility Unknown     Focused Assessments Cardiac Assessment Handoff:    Lab Results  Component Value Date   CKTOTAL 309 (H) 10/31/2022   CKTOTAL 307 (H) 10/31/2022   CKMB 2.4 10/31/2022   No results found for: "DDIMER" Does the Patient currently have chest pain? No   , Neuro Assessment Handoff:  Swallow screen pass?  Intubated         Neuro Assessment:   Neuro Checks:      Has TPA been given? No If patient is a Neuro Trauma and patient is going to OR before floor call report to 4N Charge  nurse: [phone] or [phone]  , Pulmonary Assessment Handoff:  Lung sounds: Bilateral Breath Sounds: Diminished (coarse) L Breath Sounds: Clear R Breath Sounds: Clear O2 Device: Ventilator     ,     R Recommendations: See Admitting Provider Note  Report given to:   Additional Notes:
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
## 2022-10-31 NOTE — ED Notes (Signed)
Provider at bedside doing a central line
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
## 2022-10-31 NOTE — Progress Notes (Signed)
IV team to bedside per consult. Patient not in room at this time.

## 2022-10-31 NOTE — SANE Note (Signed)
The SANE/FNE Department is aware of this patient, and will continue to monitor the patient's status.  Please contact the SANE/FNE nurse on call (listed in Amion) with any further concerns.

## 2022-10-31 NOTE — Progress Notes (Signed)
Pt transported from ED trauma A to CT and back to ED trauma A with no complications.
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
## 2022-10-31 NOTE — ED Notes (Signed)
RN Shawn and this RN wasted fentanyl in [facility]

## 2022-10-31 NOTE — Consult Note (Signed)
CC: Cerebral edema  HPI:     Patient is a Nicole [name] who was found down this afternoon and brought to ED.     Patient Active Problem List   Diagnosis Date Noted   Acute respiratory failure (HCC) 10/31/2022   History reviewed. No pertinent past medical history.  History reviewed. No pertinent surgical history.  (Not in a hospital admission)  Not on File  Social History   Tobacco Use   Smoking status: Not on file   Smokeless tobacco: Not on file  Substance Use Topics   Alcohol use: Not on file      Objective:   Patient Vitals for the past 8 hrs:  BP Temp Pulse Resp SpO2 Height Weight  10/31/22 2000 111/74 100.1 F (37.8 C) 72 (!) 28 95 % -- --  10/31/22 1938 98/81 99.7 F (37.6 C) 70 (!) 27 98 % -- --  10/31/22 1921 -- -- 69 (!) 27 93 % -- --  10/31/22 1900 125/84 98.1 F (36.7 C) 70 (!) 27 94 % -- --  10/31/22 1845 130/87 97.7 F (36.5 C) 68 (!) 25 92 % -- --  10/31/22 1715 (!) 121/93 99.3 F (37.4 C) 89 (!) 24 92 % -- --  10/31/22 1700 (!) 120/92 99.4 F (37.4 C) 92 (!) 24 91 % -- --  10/31/22 1645 (!) 122/95 99.6 F (37.6 C) 95 (!) 24 93 % -- --  10/31/22 1615 -- 100.1 F (37.8 C) (!) 106 (!) 23 100 % -- --  10/31/22 1600 -- 100.3 F (37.9 C) (!) 113 (!) 24 91 % -- --  10/31/22 1549 -- (!) 100.6 F (38.1 C) (!) 115 (!) 24 96 % -- --  10/31/22 1548 -- (!) 100.6 F (38.1 C) (!) 117 (!) 24 96 % -- --  10/31/22 1547 -- (!) 100.6 F (38.1 C) (!) 114 (!) 24 96 % -- --  10/31/22 1546 (!) 127/96 (!) 100.6 F (38.1 C) (!) 115 (!) 24 97 % -- --  10/31/22 1545 -- (!) 100.6 F (38.1 C) (!) 115 (!) 24 97 % -- --  10/31/22 1530 (!) 117/96 99.7 F (37.6 C) (!) 129 (!) 24 96 % -- --  10/31/22 1524 (!) 125/100 -- (!) 131 (!) 24 96 % -- --  10/31/22 1520 -- (!) 94.9 F (34.9 C) (!) 139 (!) 24 97 % -- --  10/31/22 1515 (!) 125/100 -- (!) 139 (!) 24 95 % -- --  10/31/22 1503 (!) 128/90 -- (!) 148 (!) 24 94 % -- --  10/31/22 1458 -- -- -- -- -- 5\' 1"  (1.549 m) --   10/31/22 1450 -- -- (!) 121 (!) 46 92 % -- 60 kg  10/31/22 1449 -- -- (!) 127 (!) 45 93 % -- --  10/31/22 1448 -- -- (!) 122 (!) 46 93 % -- --   No intake/output data recorded. No intake/output data recorded.   Intubated, eyes closed.  PERRLA Not following commands Positive corneal Positive cough/gag Dorsiflexion to noxious stimuli LLE   Data ReviewCBC:  Lab Results  Component Value Date   WBC 43.6 (H) 10/31/2022   RBC 5.58 (H) 10/31/2022   BMP:  Lab Results  Component Value Date   GLUCOSE 151 (H) 10/31/2022   CO2 21 (L) 10/31/2022   BUN 72 (H) 10/31/2022   CREATININE 2.50 (H) 10/31/2022   CALCIUM 8.9 10/31/2022   Coagulation:  Lab Results  Component Value Date   INR 1.6 (H) 10/31/2022  Cardiac markers:  Lab Results  Component Value Date   CKMB 2.4 10/31/2022   ABGs: No results found for: "PH"  CLINICAL DATA:  Head trauma, minor (Age >= 65y); ams abnormal edema within bilateral   EXAM: CT HEAD WITHOUT CONTRAST   CT CERVICAL SPINE WITHOUT CONTRAST   TECHNIQUE: Multidetector CT imaging of the head and cervical spine was performed following the standard protocol without intravenous contrast. Multiplanar CT image reconstructions of the cervical spine were also generated.   RADIATION DOSE REDUCTION: This exam was performed according to the departmental dose-optimization program which includes automated exposure control, adjustment of the mA and/or kV according to patient size and/or use of iterative reconstruction technique.   COMPARISON:  None Available.   FINDINGS: CT HEAD FINDINGS   Brain: Abnormal symmetric edema within bilateral globus palladi. No evidence of acute hemorrhage, mass lesion, midline shift or hydrocephalus. Trace/equivocal hyperdensity along the high bilateral frontal sulci. No mass occupying acute hemorrhage. No hydrocephalus or herniation.   Vascular: No hyperdense vessel identified.   Skull: No acute fracture.    Sinuses/Orbits: Clear sinuses.  No acute orbital findings.   Other: No mastoid effusions.   CT CERVICAL SPINE FINDINGS   Alignment: Rotation of C1 on C2. No substantial sagittal subluxation.   Skull base and vertebrae: Vertebral body heights are maintained. No evidence of acute fracture   Soft tissues and spinal canal: No prevertebral fluid or swelling. No visible canal hematoma.   Disc levels: Mild to moderate multilevel bony degenerative change.   Upper chest: Visualized lung apices are clear.   IMPRESSION: 1. Abnormal symmetric edema within bilateral globus palladi concerning for acute hypoxic/ischemic or metabolic/toxic injury. Recommend MRI head to further assess. 2. Trace/equivocal hyperdensity along the high bilateral frontal sulci could be artifactual or trace subarachnoid hemorrhage. No mass effect. Recommend attention on the forthcoming MRI. If MRI is not obtained, recommend follow-up CT to ensure stability. 3. Rotation of C1 on C2, most likely positional in the absence of a fixed torticollis. 4. No evidence of acute fracture in the cervical spine.  Assessment:  Diffuse cerebral edema in the setting of suspected acute hypoxic brain injury   Plan:   Poor prognosis. No acute neurosurgical intervention recommended at this time. Continue supportive care,frequent neuro exams.   Patrici Ranks, [facility]
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
## 2022-10-31 NOTE — Progress Notes (Signed)
Pharmacy Antibiotic Note  Nicole [name] is a 47 y.o. female admitted on 10/31/2022 with sepsis.  Pharmacy has been consulted for vancomycin and cefepime dosing. Patient currently a [name], using estimated age creatinine clearance calculated to be 86mL/min.   Plan: Cefepime 2g 24H.  Vancomycin 1500mg  x1 then dose based on renal recovery. Unclear baseline and current Scr 2.5.  Follow culture data for de-escalation.  Monitor renal function for dose adjustments as indicated.   Height: 5\' 1"  (154.9 cm) Weight: 60 kg (132 lb 4.4 oz) IBW/kg (Calculated) : 47.8  Temp (24hrs), Avg:94.9 F (34.9 C), Min:94.9 F (34.9 C), Max:94.9 F (34.9 C)  Recent Labs  Lab 10/31/22 1506  CREATININE 2.50*    Estimated Creatinine Clearance: -1.7 mL/min (A) (by C-G formula based on SCr of 2.5 mg/dL (H)).    Not on File  Thank you for allowing pharmacy to be a part of this patient's care.  Estill Batten, PharmD, BCCCP  10/31/2022 3:22 PM
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
## 2022-11-01 ENCOUNTER — Inpatient Hospital Stay (HOSPITAL_COMMUNITY): Payer: Medicaid Other

## 2022-11-01 LAB — CBC
HCT: 41 % (ref 36.0–46.0)
Hemoglobin: 12.9 g/dL (ref 12.0–15.0)
MCH: 31.9 pg (ref 26.0–34.0)
MCHC: 31.5 g/dL (ref 30.0–36.0)
MCV: 101.5 fL — ABNORMAL HIGH (ref 80.0–100.0)
Platelets: 154 10*3/uL (ref 150–400)
RBC: 4.04 MIL/uL (ref 3.87–5.11)
RDW: 13.4 % (ref 11.5–15.5)
WBC: 30.6 10*3/uL — ABNORMAL HIGH (ref 4.0–10.5)
nRBC: 0.1 % (ref 0.0–0.2)

## 2022-11-01 LAB — GLUCOSE, CAPILLARY
Glucose-Capillary: 112 mg/dL — ABNORMAL HIGH (ref 70–99)
Glucose-Capillary: 128 mg/dL — ABNORMAL HIGH (ref 70–99)
Glucose-Capillary: 134 mg/dL — ABNORMAL HIGH (ref 70–99)
Glucose-Capillary: 143 mg/dL — ABNORMAL HIGH (ref 70–99)
Glucose-Capillary: 159 mg/dL — ABNORMAL HIGH (ref 70–99)
Glucose-Capillary: 160 mg/dL — ABNORMAL HIGH (ref 70–99)
Glucose-Capillary: 169 mg/dL — ABNORMAL HIGH (ref 70–99)

## 2022-11-01 LAB — MAGNESIUM: Magnesium: 2.4 mg/dL (ref 1.7–2.4)

## 2022-11-01 LAB — PHOSPHORUS: Phosphorus: 2.4 mg/dL — ABNORMAL LOW (ref 2.5–4.6)

## 2022-11-01 LAB — LACTIC ACID, PLASMA: Lactic Acid, Venous: 1.8 mmol/L (ref 0.5–1.9)

## 2022-11-01 LAB — CK TOTAL AND CKMB (NOT AT ARMC)
CK, MB: 3.8 ng/mL (ref 0.5–5.0)
Total CK: 758 U/L — ABNORMAL HIGH (ref 38–234)

## 2022-11-01 LAB — BASIC METABOLIC PANEL
Anion gap: 6 (ref 5–15)
CO2: 23 mmol/L (ref 22–32)
Calcium: 7.5 mg/dL — ABNORMAL LOW (ref 8.9–10.3)
Calcium: 7.6 mg/dL — ABNORMAL LOW (ref 8.9–10.3)
Chloride: 124 mmol/L — ABNORMAL HIGH (ref 98–111)
Creatinine, Ser: 1.46 mg/dL — ABNORMAL HIGH (ref 0.44–1.00)
Potassium: 4 mmol/L (ref 3.5–5.1)
Sodium: 158 mmol/L — ABNORMAL HIGH (ref 135–145)

## 2022-11-01 LAB — CULTURE, BLOOD (ROUTINE X 2): Culture: NO GROWTH

## 2022-11-01 MED ORDER — ENOXAPARIN SODIUM 40 MG/0.4ML IJ SOSY
40.0000 mg | PREFILLED_SYRINGE | INTRAMUSCULAR | Status: DC
  Administered 2022-11-01 – 2022-11-18 (×18): 40 mg via SUBCUTANEOUS
  Filled 2022-11-01 (×17): qty 0.4

## 2022-11-01 MED ORDER — INSULIN ASPART 100 UNIT/ML IJ SOLN
0.0000 [IU] | INTRAMUSCULAR | Status: DC
  Administered 2022-11-01: 1 [IU] via SUBCUTANEOUS
  Administered 2022-11-02: 2 [IU] via SUBCUTANEOUS
  Administered 2022-11-02 (×3): 1 [IU] via SUBCUTANEOUS
  Administered 2022-11-04: 2 [IU] via SUBCUTANEOUS
  Administered 2022-11-05: 1 [IU] via SUBCUTANEOUS

## 2022-11-01 MED ORDER — PROSOURCE TF20 ENFIT COMPATIBL EN LIQD
60.0000 mL | Freq: Every day | ENTERAL | Status: DC
  Administered 2022-11-01: 60 mL
  Filled 2022-11-01: qty 60

## 2022-11-01 MED ORDER — VITAL AF 1.2 CAL PO LIQD
1000.0000 mL | ORAL | Status: DC
  Administered 2022-11-01 – 2022-11-04 (×5): 1000 mL

## 2022-11-01 MED ORDER — FAMOTIDINE 20 MG PO TABS
20.0000 mg | ORAL_TABLET | Freq: Two times a day (BID) | ORAL | Status: DC
  Administered 2022-11-01 – 2022-11-05 (×8): 20 mg
  Filled 2022-11-01 (×8): qty 1

## 2022-11-01 MED ORDER — FENTANYL 2500MCG IN NS 250ML (10MCG/ML) PREMIX INFUSION
0.0000 ug/h | INTRAVENOUS | Status: DC
  Administered 2022-11-01: 50 ug/h via INTRAVENOUS
  Filled 2022-11-01: qty 250

## 2022-11-01 MED ORDER — SODIUM CHLORIDE 0.9 % IV SOLN
2.0000 g | Freq: Two times a day (BID) | INTRAVENOUS | Status: DC
  Administered 2022-11-01: 2 g via INTRAVENOUS
  Filled 2022-11-01: qty 12.5

## 2022-11-01 MED ORDER — MEDIHONEY WOUND/BURN DRESSING EX PSTE
1.0000 | PASTE | Freq: Every day | CUTANEOUS | Status: DC
  Administered 2022-11-01 – 2022-11-18 (×16): 1 via TOPICAL
  Filled 2022-11-01: qty 44

## 2022-11-01 MED ORDER — POTASSIUM CHLORIDE 20 MEQ PO PACK
40.0000 meq | PACK | Freq: Once | ORAL | Status: AC
  Administered 2022-11-01: 40 meq
  Filled 2022-11-01: qty 2

## 2022-11-01 MED ORDER — DOXYCYCLINE HYCLATE 100 MG PO TABS
100.0000 mg | ORAL_TABLET | Freq: Two times a day (BID) | ORAL | Status: DC
  Administered 2022-11-01 – 2022-11-04 (×7): 100 mg
  Filled 2022-11-01 (×8): qty 1

## 2022-11-01 MED ORDER — DOXYCYCLINE HYCLATE 100 MG PO TABS
100.0000 mg | ORAL_TABLET | Freq: Two times a day (BID) | ORAL | Status: DC
  Filled 2022-11-01 (×2): qty 1

## 2022-11-01 MED ORDER — VITAL HIGH PROTEIN PO LIQD: 1000.0000 mL | ORAL | Status: DC

## 2022-11-01 MED ORDER — SODIUM CHLORIDE 0.9 % IV SOLN
3.0000 g | Freq: Three times a day (TID) | INTRAVENOUS | Status: DC
  Administered 2022-11-01 – 2022-11-06 (×15): 3 g via INTRAVENOUS
  Filled 2022-11-01 (×15): qty 8

## 2022-11-01 NOTE — Progress Notes (Signed)
Patient transported to MRI and back to 2M7 via vent without incident
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
## 2022-11-01 NOTE — Progress Notes (Signed)
6/18 admitted after being found unresponsive and hypoxic with bilateral pneumonia right greater than left, severe dehydration and hypernatremia, AKI,  Patient's identity is unknown. Currently intubated.  TOC following.

## 2022-11-01 NOTE — Progress Notes (Signed)
Initial Nutrition Assessment  DOCUMENTATION CODES:   Not applicable  INTERVENTION:  Tube Feeds via OG-tube: Start Vital AF 1.2 at 20 mL/hr; advance by 10 mL every 12 hours to goal rate of 55 mL/hr (1320 mL per day) Tube feed regimen at goal provides 1584 kcal, 99 gm protien, and 1071 mL free water daily. Monitor magnesium, potassium, and phosphorus BID for at least 3 days, MD to replete as needed, as pt is at risk for refeeding syndrome given unknown PO history. Discontinue Vital HP and ProSource TF  NUTRITION DIAGNOSIS:  Inadequate oral intake related to inability to eat as evidenced by NPO status.  GOAL:  Patient will meet greater than or equal to 90% of their needs  MONITOR:  Vent status, Labs, TF tolerance, Skin  REASON FOR ASSESSMENT:  Ventilator, Consult Enteral/tube feeding initiation and management  ASSESSMENT:  Nicole [name] presented to the ED after being found unresponsive in a field. Unknown PMH. Pt intubated for airway protection, admitted with AKI, bilateral pneumonia, dehydration, hypernatremia, and acute metabolic encephalopathy.    RD received a consult from CCM to start trickle TF. Reached out ot MD, MD ok with full tube feeds.   Patient is currently intubated on ventilator support. Identity still unknown. Discussed TF regiment with RN.   MV: 13.9 L/min MAP (cuff): 83 Temp (24hrs), Avg:99.6 F (37.6 C), Min:94.9 F (34.9 C), Max:100.6 F (38.1 C)  Drips D5 Fentanyl (stopped)  Medications reviewed and include: Colace, Doxycycline, Pepcid, NovoLog SSI, IV antibiotics Labs reviewed: Sodium 156, Potassium 4.0, BUN 46, Creatinine 1.21, Phosphorus 2.4, Magnesium 2.4, Vitamin B12 1314  UOP: 925 mL x 24 hours  NUTRITION - FOCUSED PHYSICAL EXAM:  Flowsheet Row Most Recent Value  Orbital Region Unable to assess  Upper Arm Region No depletion  Thoracic and Lumbar Region No depletion  Buccal Region Unable to assess  Temple Region No depletion  Clavicle Bone  Region Mild depletion  Clavicle and Acromion Bone Region Mild depletion  Scapular Bone Region Mild depletion  Dorsal Hand Unable to assess  Patellar Region No depletion  Anterior Thigh Region Mild depletion  Posterior Calf Region No depletion  Edema (RD Assessment) None  Hair Reviewed  Eyes Unable to assess  Mouth Unable to assess  Skin Reviewed  Nails Unable to assess   Diet Order:   Diet Order             Diet NPO time specified  Diet effective now                  EDUCATION NEEDS: Not appropriate for education at this time  Skin:  Skin Assessment: Skin Integrity Issues: Skin Integrity Issues:: Unstageable Unstageable: L Buttocks  Last BM:  Unknown  Height:  Ht Readings from Last 1 Encounters:  10/31/22 5\' 1"  (1.549 m)   Weight:  Wt Readings from Last 1 Encounters:  11/01/22 52.7 kg   Ideal Body Weight:  47.7 kg  BMI:  Body mass index is 21.95 kg/m.  Estimated Nutritional Needs:  Kcal:  1600-1800 Protein:  80-100 grams Fluid:  >/= 1.6 L   Kirby Crigler RD, LDN Clinical Dietitian See [facility] for contact information.
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
## 2022-11-01 NOTE — Progress Notes (Signed)
NAME:  Nicole [name], MRN:  [id number], DOB:  05/15/1873, LOS: 1 ADMISSION DATE:  10/31/2022, CONSULTATION DATE:  6/18 REFERRING MD:  Jarold Motto, CHIEF COMPLAINT:  6/18   History of Present Illness:   This is a female patient who initially is on identified an unknown.  She was seen by bystander sitting in a field next to a condemned building.  She was unresponsive, and EMS was notified.  She was hypoxic upon EMS arrival requiring 15 L nonrebreather in effort to oxygenate.  On arrival to the emergency room she remained unresponsive, tachypneic, diaphoretic, and critically ill initial white blood cell count 43.6 serum osmolality is 379 sodium 164 potassium 3.1 creatinine 2.5..  She was intubated for airway protection and hypoxic respiratory failure portable chest x-ray was obtained this showed right basilar airspace disease, CT head was obtained that showed symmetric edema within bilateral globus labia concerning for an ischemic/hypoxic injury CT of chest abdomen and pelvis showing right greater than left lower lobe consolidation nodular groundglass opacities with lucency in the right lower lobe raising concern for cavitary infection.  Cultures were sent.  Vancomycin and cefepime initiated.  Critical care was asked to admit Pertinent  Medical History  No current history available  Significant Hospital Events: Including procedures, antibiotic start and stop dates in addition to other pertinent events   6/18 admitted after being found unresponsive and hypoxic with bilateral pneumonia right greater than left, severe dehydration and hypernatremia, AKI, and concern for possible anoxic injury intubated by EDP antibiotics initiated.  She was unresponsive postintubation and during intubation process  Interim History / Subjective:  Tmax 100.4. Overnight she required fentanyl. She remains confused this morning.   Objective   Blood pressure 111/82, pulse 74, temperature 99.3 F (37.4 C), resp. rate 20,  height 5\' 1"  (1.549 m), weight 52.7 kg, SpO2 100 %.    Vent Mode: CPAP;PSV FiO2 (%):  [50 %-100 %] 50 % Set Rate:  [24 bmp] 24 bmp Vt Set:  [380 mL] 380 mL PEEP:  [5 cmH20-8 cmH20] 8 cmH20 Pressure Support:  [10 cmH20] 10 cmH20 Plateau Pressure:  [17 cmH20-21 cmH20] 17 cmH20   Intake/Output Summary (Last 24 hours) at 11/01/2022 1044 Last data filed at 11/01/2022 1000 Gross per 24 hour  Intake 2545.45 ml  Output 1255 ml  Net 1290.45 ml   Filed Weights   10/31/22 1450 11/01/22 0500  Weight: 60 kg 52.7 kg    Examination: General: critically ill appearing woman lying in bed in NAD HENT: Ray/AT, eyes anicteric, ETT Lungs: thick secretions from ETT, scattered rhonchi Cardiovascular: S1S2, RRR Abdomen: soft, NT Extremities: no cyanosis or edema Neuro: tries to open eyes with significant stimulation, wiggled toes on command, moved R arm slightly on command. Left arm not moving and it took more encouragement to get left foot to move. Not answering y/n questions.  Derm: erythematous anteriorly  LA 2.4> 1.8  Na+  156 BUN 46 Cr 1.21 WBC 30.6 H/H 12.9/41 Platelets 154 Blood culture> NGTD Trach aspirate> not collected  CT chest personally reviewed> TIB nodules in upper portion of lower lobes, dense consolidation R>L lower lobes, possible posterior cavity in RLL. No significant mediastinal adenopathy.  Resolved Hospital Problem list     Assessment & Plan:  Acute metabolic encephalopathy/comatose state-- presumed septic vs due to electrolyte abnormalities.  Etiology unclear CT of head raising concern for possible anoxic injury.  She was hypoxic on EMS arrival and downtime prior to this is unknown. Drug intoxication possible with  UDS + cocaine. -PAD protocol -hold sedating meds as much as possible, correct electrolyte abnormalities  Acute hypoxic respiratory failure with bilateral pneumonia and possible cavitary right lower lobe pneumonia, likely aspiration pneumonia -LTVV -VAP  prevention protocol -PAD protocol for sedation -daily SAT &SBT as appropriate; mental status still precludes safe extubation -wean O2 as able -change antibiotis- unasyn, doxycycline for atypicals and MRSA coverage -will need repeat follow up imaging of her chest 6-8 weeks after discharge to ensure full resolution of infiltrates.   Sepsis with lactic acidosis due to pneumonia    Lactic acidosis resolved -goal euvolemia -con't antibiotics x 7 days -if MAP <65 would start pressors -follow cultures  AKI- septic ATN most likely vs hypovolemia Hypernatremia due to dehydration -strict I/O -renally dose meds, avoid nephrotoxic meds -monitor -maintain adequate perfusion  Mild rhabdo -serial CK daily   Elevated LFTs secondary to shock liver -monitor, supportive care  Mild protein energy malnutrition -start TF   Best Practice (right click and "Reselect all SmartList Selections" daily)   Diet/type: tubefeeds DVT prophylaxis: LMWH GI prophylaxis: H2B Lines: Central line Foley:  Yes, and it is still needed Code Status:  full code Last date of multidisciplinary goals of care discussion [pending]  Labs   CBC: Recent Labs  Lab 10/31/22 1500 10/31/22 1506 10/31/22 1536 11/01/22 0646  WBC 43.6*  --   --  30.6*  NEUTROABS 39.7*  --   --   --   HGB 18.0* 17.7* 15.3* 12.9  HCT 54.7* 52.0* 45.0 41.0  MCV 98.0  --   --  101.5*  PLT 272  --   --  154     Basic Metabolic Panel: Recent Labs  Lab 10/31/22 1500 10/31/22 1506 10/31/22 1536 11/01/22 0013 11/01/22 0613  NA 161* 165* 164* 158* 156*  K 3.5 3.2* 3.1* 3.2* 4.0  CL 124* 130*  --  124* 125*  CO2 21*  --   --  23 23  GLUCOSE 152* 151*  --  184* 197*  BUN 79* 72*  --  55* 46*  CREATININE 2.57* 2.50*  --  1.46* 1.21*  CALCIUM 8.9  --   --  7.6* 7.5*  MG  --   --   --   --  2.4  PHOS  --   --   --   --  2.4*    GFR: Estimated Creatinine Clearance: -3.3 mL/min (A) (by C-G formula based on SCr of 1.21 mg/dL  (H)). Recent Labs  Lab 10/31/22 1500 10/31/22 1809 10/31/22 2143 11/01/22 0613 11/01/22 0646  PROCALCITON 13.55  --   --   --   --   WBC 43.6*  --   --   --  30.6*  LATICACIDVEN 4.4* 4.1* 2.4* 1.8  --         Critical care time:      This patient is critically ill with multiple organ system failure which requires frequent high complexity decision making, assessment, support, evaluation, and titration of therapies. This was completed through the application of advanced monitoring technologies and extensive interpretation of multiple databases. During this encounter critical care time was devoted to patient care services described in this note for 40 minutes.  Steffanie Dunn, DO 11/01/22 12:12 PM [facility]  For contact information, see Amion. If no response to pager, please call PCCM consult pager. After hours, 7PM- 7AM, please call Elink.

## 2022-11-01 NOTE — Progress Notes (Signed)
eLink Physician-Brief Progress Note Patient Name: Nicole [name] DOB: 05/15/1873 MRN: [id number]   Date of Service  11/01/2022  HPI/Events of Note  MRI results reviewed.  eICU Interventions  No acute intervention.        Thomasene Lot Roey Coopman 11/01/2022, 12:51 AM

## 2022-11-02 DIAGNOSIS — E878 Other disorders of electrolyte and fluid balance, not elsewhere classified: Secondary | ICD-10-CM

## 2022-11-02 DIAGNOSIS — E876 Hypokalemia: Secondary | ICD-10-CM

## 2022-11-02 DIAGNOSIS — Z9911 Dependence on respirator [ventilator] status: Secondary | ICD-10-CM

## 2022-11-02 LAB — CBC
HCT: 37.4 % (ref 36.0–46.0)
Hemoglobin: 12.1 g/dL (ref 12.0–15.0)
MCH: 33.5 pg (ref 26.0–34.0)
MCHC: 32.4 g/dL (ref 30.0–36.0)
MCV: 103.6 fL — ABNORMAL HIGH (ref 80.0–100.0)
Platelets: 111 10*3/uL — ABNORMAL LOW (ref 150–400)
RBC: 3.61 MIL/uL — ABNORMAL LOW (ref 3.87–5.11)
RDW: 13.6 % (ref 11.5–15.5)
WBC: 16.4 10*3/uL — ABNORMAL HIGH (ref 4.0–10.5)
nRBC: 0.2 % (ref 0.0–0.2)

## 2022-11-02 LAB — GLUCOSE, CAPILLARY
Glucose-Capillary: 120 mg/dL — ABNORMAL HIGH (ref 70–99)
Glucose-Capillary: 137 mg/dL — ABNORMAL HIGH (ref 70–99)
Glucose-Capillary: 151 mg/dL — ABNORMAL HIGH (ref 70–99)
Glucose-Capillary: 127 mg/dL — ABNORMAL HIGH (ref 70–99)
Glucose-Capillary: 72 mg/dL (ref 70–99)
Glucose-Capillary: 93 mg/dL (ref 70–99)

## 2022-11-02 LAB — POCT I-STAT EG7
Acid-base deficit: 2 mmol/L (ref 0.0–2.0)
Bicarbonate: 24.4 mmol/L (ref 20.0–28.0)
Calcium, Ion: 1.15 mmol/L (ref 1.15–1.40)
HCT: 37 % (ref 36.0–46.0)
Hemoglobin: 12.6 g/dL (ref 12.0–15.0)
O2 Saturation: 18 %
Patient temperature: 37.5
Potassium: 3.2 mmol/L — ABNORMAL LOW (ref 3.5–5.1)
Sodium: 158 mmol/L — ABNORMAL HIGH (ref 135–145)
TCO2: 26 mmol/L (ref 22–32)
pCO2, Ven: 47.8 mmHg (ref 44–60)
pH, Ven: 7.317 (ref 7.25–7.43)
pO2, Ven: 16 mmHg — CL (ref 32–45)

## 2022-11-02 LAB — CK TOTAL AND CKMB (NOT AT ARMC)
CK, MB: 3.4 ng/mL (ref 0.5–5.0)
CK, MB: 4.2 ng/mL (ref 0.5–5.0)
Total CK: 901 U/L — ABNORMAL HIGH (ref 38–234)
Total CK: 755 U/L — ABNORMAL HIGH (ref 38–234)

## 2022-11-02 LAB — PHOSPHORUS: Phosphorus: 1.9 mg/dL — ABNORMAL LOW (ref 2.5–4.6)

## 2022-11-02 LAB — BASIC METABOLIC PANEL
CO2: 23 mmol/L (ref 22–32)
Calcium: 7.5 mg/dL — ABNORMAL LOW (ref 8.9–10.3)
Calcium: 7.2 mg/dL — ABNORMAL LOW (ref 8.9–10.3)
Sodium: 157 mmol/L — ABNORMAL HIGH (ref 135–145)

## 2022-11-02 LAB — CULTURE, BLOOD (ROUTINE X 2): Culture: NO GROWTH

## 2022-11-02 LAB — CULTURE, RESPIRATORY W GRAM STAIN

## 2022-11-02 LAB — MAGNESIUM: Magnesium: 2.3 mg/dL (ref 1.7–2.4)

## 2022-11-02 MED ORDER — FREE WATER
200.0000 mL | Status: DC
  Administered 2022-11-02 – 2022-11-05 (×18): 200 mL

## 2022-11-02 MED ORDER — POTASSIUM CHLORIDE 20 MEQ PO PACK
40.0000 meq | PACK | Freq: Once | ORAL | Status: AC
  Administered 2022-11-02: 40 meq
  Filled 2022-11-02: qty 2

## 2022-11-02 MED ORDER — THIAMINE MONONITRATE 100 MG PO TABS
100.0000 mg | ORAL_TABLET | Freq: Every day | ORAL | Status: DC
  Administered 2022-11-02 – 2022-11-05 (×4): 100 mg
  Filled 2022-11-02 (×4): qty 1

## 2022-11-02 MED ORDER — DEXMEDETOMIDINE HCL IN NACL 400 MCG/100ML IV SOLN
0.0000 ug/kg/h | INTRAVENOUS | Status: DC
  Filled 2022-11-02: qty 100

## 2022-11-02 MED ORDER — PROPOFOL 1000 MG/100ML IV EMUL
0.0000 ug/kg/min | INTRAVENOUS | Status: DC
  Administered 2022-11-02: 5 ug/kg/min via INTRAVENOUS
  Administered 2022-11-03: 30 ug/kg/min via INTRAVENOUS
  Administered 2022-11-03: 40 ug/kg/min via INTRAVENOUS
  Filled 2022-11-02: qty 100

## 2022-11-02 MED ORDER — POLYETHYLENE GLYCOL 3350 17 G PO PACK
17.0000 g | PACK | Freq: Every day | ORAL | Status: DC
  Administered 2022-11-02 – 2022-11-04 (×3): 17 g
  Filled 2022-11-02 (×3): qty 1

## 2022-11-02 MED ORDER — POTASSIUM CHLORIDE 10 MEQ/100ML IV SOLN: 10.0000 meq | INTRAVENOUS | Status: DC

## 2022-11-02 MED ORDER — POTASSIUM CHLORIDE 20 MEQ PO PACK: 40.0000 meq | PACK | Freq: Once | ORAL | Status: DC

## 2022-11-02 MED ORDER — POTASSIUM PHOSPHATES 15 MMOLE/5ML IV SOLN
30.0000 mmol | Freq: Once | INTRAVENOUS | Status: AC
  Administered 2022-11-02: 30 mmol via INTRAVENOUS
  Filled 2022-11-02: qty 10

## 2022-11-02 MED ORDER — DEXTROSE 5 % IV SOLN: INTRAVENOUS | Status: DC

## 2022-11-02 MED ORDER — POTASSIUM CHLORIDE 20 MEQ PO PACK
60.0000 meq | PACK | Freq: Once | ORAL | Status: AC
  Administered 2022-11-02: 60 meq
  Filled 2022-11-02: qty 3
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
## 2022-11-02 NOTE — Progress Notes (Signed)
eLink Physician-Brief Progress Note Patient Name: Nicole [name] DOB: 05/15/1873 MRN: [id number]   Date of Service  11/02/2022  HPI/Events of Note  Serum sodium rising on D   5 1/2 NS gtt.  eICU Interventions  D 5 1/2 NS gtt discontinued, and D 5 water gtt substituted at a rate of 125 ml / hour.        Nicole [name] 11/02/2022, 2:05 AM

## 2022-11-02 NOTE — Progress Notes (Signed)
BP 105/64   Pulse 88   Temp 99.7 F (37.6 C)   Resp (!) 32   Ht 5\' 1"  (1.549 m)   Wt 50.7 kg   SpO2 94%   BMI 21.12 kg/m  More awake, eyes open, tracking to both sides. Nodding to some questions. Following commands on the R, will minimally move toes on the left, but otherwise not moving left side. Seems to have a RUE tremor.    Failed SBT on PS 10 due to tachypnea.   Back on PRVC. Switch sedation to propofol so it is shorter acting than fentanyl.  Steffanie Dunn, DO 11/02/22 10:11 AM [facility]  For contact information, see Amion. If no response to pager, please call PCCM consult pager. After hours, 7PM- 7AM, please call Elink.
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
## 2022-11-02 NOTE — Progress Notes (Signed)
RT attempted to obtain routine ABG. CNO X2.  Venous sample collected and WNL. Spo2 97%
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
## 2022-11-02 NOTE — Progress Notes (Signed)
eLink Physician-Brief Progress Note Patient Name: Nicole [name] DOB: 05/15/1873 MRN: [id number]   Date of Service  11/02/2022  HPI/Events of Note  Bedside RN indicates patient is having purposeful right arm movements now that sedation has been weaned, per RN she appears to be trying to reach for her Foley catheter. Recent brain MRI results noted.  eICU Interventions  No intervention currently. Continue to monitor closely. Patient has mittens on.        Migdalia Dk 11/02/2022, 6:35 AM

## 2022-11-03 ENCOUNTER — Inpatient Hospital Stay (HOSPITAL_COMMUNITY): Payer: MEDICAID

## 2022-11-03 LAB — MAGNESIUM: Magnesium: 2.1 mg/dL (ref 1.7–2.4)

## 2022-11-03 LAB — HEPATIC FUNCTION PANEL
ALT: 95 U/L — ABNORMAL HIGH (ref 0–44)
AST: 39 U/L (ref 15–41)
Albumin: <1.5 g/dL — ABNORMAL LOW (ref 3.5–5.0)
Alkaline Phosphatase: 49 U/L (ref 38–126)
Bilirubin, Direct: 0.4 mg/dL — ABNORMAL HIGH (ref 0.0–0.2)
Indirect Bilirubin: 0.4 mg/dL (ref 0.3–0.9)
Total Bilirubin: 0.8 mg/dL (ref 0.3–1.2)
Total Protein: 5.4 g/dL — ABNORMAL LOW (ref 6.5–8.1)

## 2022-11-03 LAB — GLUCOSE, CAPILLARY
Glucose-Capillary: 100 mg/dL — ABNORMAL HIGH (ref 70–99)
Glucose-Capillary: 111 mg/dL — ABNORMAL HIGH (ref 70–99)
Glucose-Capillary: 73 mg/dL (ref 70–99)
Glucose-Capillary: 82 mg/dL (ref 70–99)
Glucose-Capillary: 84 mg/dL (ref 70–99)
Glucose-Capillary: 91 mg/dL (ref 70–99)

## 2022-11-03 LAB — PHOSPHORUS: Phosphorus: 3.5 mg/dL (ref 2.5–4.6)

## 2022-11-03 LAB — BASIC METABOLIC PANEL
Anion gap: 10 (ref 5–15)
Calcium: 7.5 mg/dL — ABNORMAL LOW (ref 8.9–10.3)
GFR, Estimated: 54 mL/min — ABNORMAL LOW (ref >60)
Potassium: 3.4 mmol/L — ABNORMAL LOW (ref 3.5–5.1)

## 2022-11-03 LAB — CULTURE, BLOOD (ROUTINE X 2)

## 2022-11-03 LAB — CULTURE, RESPIRATORY W GRAM STAIN

## 2022-11-03 MED ORDER — ACETAMINOPHEN 325 MG PO TABS
650.0000 mg | ORAL_TABLET | Freq: Four times a day (QID) | ORAL | Status: DC | PRN
  Administered 2022-11-03: 650 mg
  Filled 2022-11-03: qty 2

## 2022-11-03 MED ORDER — CALCIUM GLUCONATE-NACL 2-0.675 GM/100ML-% IV SOLN
2.0000 g | Freq: Once | INTRAVENOUS | Status: AC
  Administered 2022-11-03: 2000 mg via INTRAVENOUS
  Filled 2022-11-03: qty 100

## 2022-11-03 MED ORDER — POTASSIUM CHLORIDE 10 MEQ/50ML IV SOLN
10.0000 meq | INTRAVENOUS | Status: AC
  Administered 2022-11-03 (×4): 10 meq via INTRAVENOUS
  Filled 2022-11-03 (×4): qty 50

## 2022-11-03 MED ORDER — DEXTROSE 5 % IV SOLN: INTRAVENOUS | Status: DC

## 2022-11-03 MED ORDER — ADULT MULTIVITAMIN W/MINERALS CH
1.0000 | ORAL_TABLET | Freq: Every day | ORAL | Status: DC
  Administered 2022-11-03 – 2022-11-05 (×3): 1
  Filled 2022-11-03 (×3): qty 1

## 2022-11-03 MED ORDER — POTASSIUM CHLORIDE 20 MEQ PO PACK
40.0000 meq | PACK | Freq: Once | ORAL | Status: AC
  Administered 2022-11-03: 40 meq
  Filled 2022-11-03: qty 2

## 2022-11-03 MED ORDER — IBUPROFEN 100 MG/5ML PO SUSP
200.0000 mg | Freq: Four times a day (QID) | ORAL | Status: DC | PRN
  Administered 2022-11-04 – 2022-11-05 (×3): 200 mg
  Filled 2022-11-03 (×5): qty 10

## 2022-11-03 MED ORDER — POTASSIUM CHLORIDE 10 MEQ/100ML IV SOLN
10.0000 meq | INTRAVENOUS | Status: AC
  Administered 2022-11-03 (×4): 10 meq via INTRAVENOUS
  Filled 2022-11-03 (×4): qty 100

## 2022-11-03 NOTE — Progress Notes (Signed)
Brief Nutrition Support Note  Pt remains on vent support at this time. OGT in place and pt remains on trickle feeds due to refeeding syndrome. Reviewed labs this AM and phosphorus and Mg WNL. K slightly low but being replaced. Will begin slowly advancing to goal and continue to monitor electrolytes. Discussed with RN.  Potassium  Date Value Ref Range Status  11/03/2022 3.4 (L) 3.5 - 5.1 mmol/L Final   Magnesium  Date Value Ref Range Status  11/03/2022 2.1 1.7 - 2.4 mg/dL Final   Phosphorus  Date Value Ref Range Status  11/03/2022 3.5 2.5 - 4.6 mg/dL Final    INTERVENTION:  Tube Feeds via OG-tube: Vital AF 1.2 at 55 mL/hr; advance by 10 mL every 8 hours to goal rate of 55 mL/hr (1320 mL per day) Tube feed regimen at goal provides 1584 kcal, 99 gm protien, and 1071 mL free water daily. Monitor magnesium, potassium, and phosphorus BID for at least 3 days, MD to replete as needed, as pt is at risk for refeeding syndrome given unknown PO history.   Greig Castilla, RD, LDN Clinical Dietitian RD pager # available in AMION  After hours/weekend pager # available in [facility]

## 2022-11-03 NOTE — Progress Notes (Signed)
NAME:  Nicole [name], MRN:  [id number], DOB:  05/15/1873, LOS: 3 ADMISSION DATE:  10/31/2022, CONSULTATION DATE:  6/18 REFERRING MD:  Jarold Motto, CHIEF COMPLAINT:  6/18   History of Present Illness:   This is a female patient who initially is on identified an unknown.  She was seen by bystander sitting in a field next to a condemned building.  She was unresponsive, and EMS was notified.  She was hypoxic upon EMS arrival requiring 15 L nonrebreather in effort to oxygenate.  On arrival to the emergency room she remained unresponsive, tachypneic, diaphoretic, and critically ill initial white blood cell count 43.6 serum osmolality is 379 sodium 164 potassium 3.1 creatinine 2.5..  She was intubated for airway protection and hypoxic respiratory failure portable chest x-ray was obtained this showed right basilar airspace disease, CT head was obtained that showed symmetric edema within bilateral globus labia concerning for an ischemic/hypoxic injury CT of chest abdomen and pelvis showing right greater than left lower lobe consolidation nodular groundglass opacities with lucency in the right lower lobe raising concern for cavitary infection.  Cultures were sent.  Vancomycin and cefepime initiated.  Critical care was asked to admit Pertinent  Medical History  No current history available  Significant Hospital Events: Including procedures, antibiotic start and stop dates in addition to other pertinent events   6/18 admitted after being found unresponsive and hypoxic with bilateral pneumonia right greater than left, severe dehydration and hypernatremia, AKI, and concern for possible anoxic injury intubated by EDP antibiotics initiated.  She was unresponsive postintubation and during intubation process  Interim History / Subjective:  Has intermittently followed commands but currently sedated on propofol.  When sedation is interrupted she coughs requiring intermittent fentanyl.  Objective   Blood pressure  112/74, pulse 75, temperature 98.8 F (37.1 C), temperature source Bladder, resp. rate (!) 29, height 5\' 1"  (1.549 m), weight 53.5 kg, SpO2 100 %.    Vent Mode: PRVC FiO2 (%):  [40 %] 40 % Set Rate:  [24 bmp] 24 bmp Vt Set:  [380 mL] 380 mL PEEP:  [5 cmH20-8 cmH20] 5 cmH20 Plateau Pressure:  [20 cmH20] 20 cmH20   Intake/Output Summary (Last 24 hours) at 11/03/2022 1430 Last data filed at 11/03/2022 1226 Gross per 24 hour  Intake 2970.42 ml  Output 2750 ml  Net 220.42 ml    Filed Weights   11/01/22 0500 11/02/22 0500 11/03/22 0500  Weight: 52.7 kg 50.7 kg 53.5 kg    Examination: General: critically ill appearing woman lying in bed in NAD.  Appears malnourished and ill kempt. HENT: NNC/AT, eyes anicteric Lungs: thick dark bloody secretions from ETT, diffuse rhonchi. Cardiovascular: S1S2, RRR Abdomen: soft, NT Extremities: no cyanosis or edema Neuro: PERRL, RASS -4, moving R arm some but not following commands elsewhere Derm: sunburned on front of body   Ancillary tests personally reviewed  Creatinine normal 0.74. Marked hypoalbuminemia at less than 1.5.   Assessment & Plan:  Acute metabolic encephalopathy/comatose state with isolated bilateral globus pallidus injury consistent with cocaine use Acute hypoxic respiratory failure with bilateral pneumonia and possible cavitary right lower lobe pneumonia, likely aspiration pneumonia Sepsis with lactic acidosis due to pneumonia   now off vasopressors Lactic acidosis resolved AKI- septic ATN most likely vs hypovolemia has now resolved Hypernatremia due to dehydration now resolved Hypokalemia corrected  Mild rhabdo, down-trending CK improved. Elevated LFTs secondary to shock liver resolving. Moderate protein energy malnutrition Mild refeeding syndrome Hypophosphatermia, hypokalemia, hypocalcemia  Plan:  -Keep off  sedation to allow for best neurological recovery -Intermittent sedation only if possible -Continue current  antibiotics.  CT if white count increases or fevers recur. -Continue to progress tube feeds as per RD -Continue to attempt to reach family. -Daily SBT.     Best Practice (right click and "Reselect all SmartList Selections" daily)   Diet/type: tubefeeds DVT prophylaxis: LMWH GI prophylaxis: H2B Lines: Central line Foley:  Yes, and it is still needed Code Status:  full code Last date of multidisciplinary goals of care discussion [no family at bedside, no known relatives]   This patient is critically ill with multiple organ system failure which requires frequent high complexity decision making, assessment, support, evaluation, and titration of therapies. This was completed through the application of advanced monitoring technologies and extensive interpretation of multiple databases. During this encounter critical care time was devoted to patient care services described in this note for 41 minutes.  Lynnell Catalan, MD 11/03/22 2:30 PM [facility]  For contact information, see Amion. If no response to pager, please call PCCM consult pager. After hours, 7PM- 7AM, please call Elink.

## 2022-11-03 NOTE — Progress Notes (Signed)
[facility] ADULT ICU REPLACEMENT PROTOCOL   The patient does apply for the [facility] Adult ICU Electrolyte Replacment Protocol based on the criteria listed below:   1.Exclusion criteria: TCTS, ECMO, Dialysis, and Myasthenia Gravis patients 2. Is GFR >/= 30 ml/min? Yes.    Patient's GFR today is 54 3. Is SCr </= 2? Yes.   Patient's SCr is 0.74 mg/dL 4. Did SCr increase >/= 0.5 in 24 hours? No. 5.[name] weight >40kg  Yes.   6. Abnormal electrolyte(s): K+ 3.4  7. Electrolytes replaced per protocol 8.  Call MD STAT for K+ </= 2.5, Phos </= 1, or Mag </= 1 Physician:  [place] [facility] 11/03/2022 5:05 AM

## 2022-11-03 NOTE — SANE Note (Signed)
On 11/03/2022, at approximately 1009 hours, I was advised that the [facility] Department Family Victims Unit (FVU) will not be requesting a SANE/FNE evaluation of the patient.

## 2022-11-04 ENCOUNTER — Other Ambulatory Visit: Payer: Self-pay

## 2022-11-04 ENCOUNTER — Inpatient Hospital Stay (HOSPITAL_COMMUNITY): Payer: Medicaid Other

## 2022-11-04 DIAGNOSIS — G9341 Metabolic encephalopathy: Secondary | ICD-10-CM

## 2022-11-04 LAB — CBC WITH DIFFERENTIAL/PLATELET
Abs Immature Granulocytes: 0.65 10*3/uL — ABNORMAL HIGH (ref 0.00–0.07)
Basophils Absolute: 0.1 10*3/uL (ref 0.0–0.1)
Basophils Relative: 0 %
Eosinophils Absolute: 0.1 10*3/uL (ref 0.0–0.5)
Eosinophils Relative: 0 %
HCT: 38.6 % (ref 36.0–46.0)
Hemoglobin: 12.8 g/dL (ref 12.0–15.0)
Immature Granulocytes: 2 %
Lymphocytes Relative: 7 %
Lymphs Abs: 2.2 10*3/uL (ref 0.7–4.0)
MCH: 32.2 pg (ref 26.0–34.0)
MCHC: 33.2 g/dL (ref 30.0–36.0)
MCV: 97.2 fL (ref 80.0–100.0)
Monocytes Absolute: 0.8 10*3/uL (ref 0.1–1.0)
Monocytes Relative: 3 %
Neutro Abs: 27.4 10*3/uL — ABNORMAL HIGH (ref 1.7–7.7)
Neutrophils Relative %: 88 %
Platelets: 144 10*3/uL — ABNORMAL LOW (ref 150–400)
RBC: 3.97 MIL/uL (ref 3.87–5.11)
RDW: 12.4 % (ref 11.5–15.5)
WBC: 31.2 10*3/uL — ABNORMAL HIGH (ref 4.0–10.5)
nRBC: 0 % (ref 0.0–0.2)

## 2022-11-04 LAB — POCT I-STAT, CHEM 8
BUN: 72 mg/dL — ABNORMAL HIGH (ref 6–20)
Calcium, Ion: 1.03 mmol/L — ABNORMAL LOW (ref 1.15–1.40)
Chloride: 130 mmol/L — ABNORMAL HIGH (ref 98–111)
Creatinine, Ser: 2.5 mg/dL — ABNORMAL HIGH (ref 0.44–1.00)
Glucose, Bld: 151 mg/dL — ABNORMAL HIGH (ref 70–99)
HCT: 52 % — ABNORMAL HIGH (ref 36.0–46.0)
Hemoglobin: 17.7 g/dL — ABNORMAL HIGH (ref 12.0–15.0)
Potassium: 3.2 mmol/L — ABNORMAL LOW (ref 3.5–5.1)
Sodium: 165 mmol/L (ref 135–145)
TCO2: 20 mmol/L — ABNORMAL LOW (ref 22–32)

## 2022-11-04 LAB — BASIC METABOLIC PANEL WITH GFR
Anion gap: 12 (ref 5–15)
BUN: 18 mg/dL (ref 6–20)
CO2: 21 mmol/L — ABNORMAL LOW (ref 22–32)
Calcium: 7.5 mg/dL — ABNORMAL LOW (ref 8.9–10.3)
Chloride: 114 mmol/L — ABNORMAL HIGH (ref 98–111)
Creatinine, Ser: 0.86 mg/dL (ref 0.44–1.00)
GFR, Estimated: 45 mL/min — ABNORMAL LOW
Glucose, Bld: 145 mg/dL — ABNORMAL HIGH (ref 70–99)
Potassium: 3.7 mmol/L (ref 3.5–5.1)
Sodium: 147 mmol/L — ABNORMAL HIGH (ref 135–145)

## 2022-11-04 LAB — BASIC METABOLIC PANEL
Anion gap: 11 (ref 5–15)
Anion gap: 12 (ref 5–15)
Anion gap: 5 (ref 5–15)
Anion gap: 6 (ref 5–15)
Anion gap: 6 (ref 5–15)
Anion gap: 8 (ref 5–15)
BUN: 12 mg/dL (ref 6–20)
BUN: 15 mg/dL (ref 6–20)
BUN: 18 mg/dL (ref 6–20)
BUN: 23 mg/dL — ABNORMAL HIGH (ref 6–20)
BUN: 29 mg/dL — ABNORMAL HIGH (ref 6–20)
BUN: 35 mg/dL — ABNORMAL HIGH (ref 6–20)
BUN: 46 mg/dL — ABNORMAL HIGH (ref 6–20)
BUN: 55 mg/dL — ABNORMAL HIGH (ref 6–20)
CO2: 17 mmol/L — ABNORMAL LOW (ref 22–32)
CO2: 22 mmol/L (ref 22–32)
CO2: 22 mmol/L (ref 22–32)
CO2: 23 mmol/L (ref 22–32)
CO2: 23 mmol/L (ref 22–32)
CO2: 24 mmol/L (ref 22–32)
Calcium: 7.3 mg/dL — ABNORMAL LOW (ref 8.9–10.3)
Calcium: 6 mg/dL — CL (ref 8.9–10.3)
Calcium: 8.4 mg/dL — ABNORMAL LOW (ref 8.9–10.3)
Chloride: 125 mmol/L — ABNORMAL HIGH (ref 98–111)
Chloride: 126 mmol/L — ABNORMAL HIGH (ref 98–111)
Chloride: 123 mmol/L — ABNORMAL HIGH (ref 98–111)
Chloride: 124 mmol/L — ABNORMAL HIGH (ref 98–111)
Chloride: 110 mmol/L (ref 98–111)
Chloride: 120 mmol/L — ABNORMAL HIGH (ref 98–111)
Chloride: 107 mmol/L (ref 98–111)
Creatinine, Ser: 1.21 mg/dL — ABNORMAL HIGH (ref 0.44–1.00)
Creatinine, Ser: 1.12 mg/dL — ABNORMAL HIGH (ref 0.44–1.00)
Creatinine, Ser: 0.98 mg/dL (ref 0.44–1.00)
Creatinine, Ser: 0.93 mg/dL (ref 0.44–1.00)
Creatinine, Ser: 0.74 mg/dL (ref 0.44–1.00)
Creatinine, Ser: 0.47 mg/dL (ref 0.44–1.00)
Creatinine, Ser: 0.71 mg/dL (ref 0.44–1.00)
GFR, Estimated: 24 mL/min — ABNORMAL LOW (ref >60)
GFR, Estimated: 30 mL/min — ABNORMAL LOW (ref >60)
GFR, Estimated: 33 mL/min — ABNORMAL LOW (ref >60)
GFR, Estimated: 38 mL/min — ABNORMAL LOW (ref >60)
GFR, Estimated: 41 mL/min — ABNORMAL LOW (ref >60)
GFR, Estimated: 57 mL/min — ABNORMAL LOW (ref >60)
GFR, Estimated: >60 mL/min (ref >60)
Glucose, Bld: 120 mg/dL — ABNORMAL HIGH (ref 70–99)
Glucose, Bld: 146 mg/dL — ABNORMAL HIGH (ref 70–99)
Glucose, Bld: 153 mg/dL — ABNORMAL HIGH (ref 70–99)
Glucose, Bld: 155 mg/dL — ABNORMAL HIGH (ref 70–99)
Glucose, Bld: 163 mg/dL — ABNORMAL HIGH (ref 70–99)
Glucose, Bld: 184 mg/dL — ABNORMAL HIGH (ref 70–99)
Glucose, Bld: 197 mg/dL — ABNORMAL HIGH (ref 70–99)
Glucose, Bld: 94 mg/dL (ref 70–99)
Potassium: 3.2 mmol/L — ABNORMAL LOW (ref 3.5–5.1)
Potassium: 3.3 mmol/L — ABNORMAL LOW (ref 3.5–5.1)
Potassium: 3.4 mmol/L — ABNORMAL LOW (ref 3.5–5.1)
Potassium: 3.5 mmol/L (ref 3.5–5.1)
Potassium: 3.7 mmol/L (ref 3.5–5.1)
Potassium: 4.2 mmol/L (ref 3.5–5.1)
Sodium: 156 mmol/L — ABNORMAL HIGH (ref 135–145)
Sodium: 155 mmol/L — ABNORMAL HIGH (ref 135–145)
Sodium: 152 mmol/L — ABNORMAL HIGH (ref 135–145)
Sodium: 142 mmol/L (ref 135–145)
Sodium: 143 mmol/L (ref 135–145)
Sodium: 137 mmol/L (ref 135–145)

## 2022-11-04 LAB — COMPREHENSIVE METABOLIC PANEL
AST: 102 U/L — ABNORMAL HIGH (ref 15–41)
Albumin: 2.6 g/dL — ABNORMAL LOW (ref 3.5–5.0)
Alkaline Phosphatase: 90 U/L (ref 38–126)
Anion gap: 16 — ABNORMAL HIGH (ref 5–15)
BUN: 79 mg/dL — ABNORMAL HIGH (ref 6–20)
CO2: 21 mmol/L — ABNORMAL LOW (ref 22–32)
Calcium: 8.9 mg/dL (ref 8.9–10.3)
Chloride: 124 mmol/L — ABNORMAL HIGH (ref 98–111)
Creatinine, Ser: 2.57 mg/dL — ABNORMAL HIGH (ref 0.44–1.00)
GFR, Estimated: 12 mL/min — ABNORMAL LOW (ref >60)
Potassium: 3.5 mmol/L (ref 3.5–5.1)
Sodium: 161 mmol/L (ref 135–145)
Total Bilirubin: 2.2 mg/dL — ABNORMAL HIGH (ref 0.3–1.2)
Total Protein: 8 g/dL (ref 6.5–8.1)

## 2022-11-04 LAB — GLUCOSE, CAPILLARY
Glucose-Capillary: 120 mg/dL — ABNORMAL HIGH (ref 70–99)
Glucose-Capillary: 105 mg/dL — ABNORMAL HIGH (ref 70–99)
Glucose-Capillary: 106 mg/dL — ABNORMAL HIGH (ref 70–99)
Glucose-Capillary: 92 mg/dL (ref 70–99)
Glucose-Capillary: 72 mg/dL (ref 70–99)
Glucose-Capillary: 151 mg/dL — ABNORMAL HIGH (ref 70–99)

## 2022-11-04 LAB — LEGIONELLA PNEUMOPHILA SEROGP 1 UR AG: L. pneumophila Serogp 1 Ur Ag: NEGATIVE

## 2022-11-04 LAB — PHOSPHORUS: Phosphorus: 4.2 mg/dL (ref 2.5–4.6)

## 2022-11-04 LAB — PROCALCITONIN: Procalcitonin: 1.52 ng/mL

## 2022-11-04 LAB — CULTURE, RESPIRATORY W GRAM STAIN

## 2022-11-04 LAB — MAGNESIUM: Magnesium: 1.9 mg/dL (ref 1.7–2.4)

## 2022-11-04 MED ORDER — SODIUM CHLORIDE 0.9 % IV SOLN: INTRAVENOUS | Status: DC | PRN

## 2022-11-04 MED ORDER — LACTATED RINGERS IV BOLUS
500.0000 mL | Freq: Once | INTRAVENOUS | Status: AC
  Administered 2022-11-04: 500 mL via INTRAVENOUS

## 2022-11-04 MED ORDER — LINEZOLID 600 MG/300ML IV SOLN
600.0000 mg | Freq: Two times a day (BID) | INTRAVENOUS | Status: DC
  Administered 2022-11-04 – 2022-11-07 (×6): 600 mg via INTRAVENOUS
  Filled 2022-11-04 (×7): qty 300

## 2022-11-04 MED ORDER — MAGNESIUM SULFATE 2 GM/50ML IV SOLN
2.0000 g | Freq: Once | INTRAVENOUS | Status: AC
  Administered 2022-11-04: 2 g via INTRAVENOUS
  Filled 2022-11-04: qty 50

## 2022-11-04 MED ORDER — METOCLOPRAMIDE HCL 5 MG/ML IJ SOLN
10.0000 mg | Freq: Four times a day (QID) | INTRAMUSCULAR | Status: DC | PRN
  Administered 2022-11-04: 10 mg via INTRAVENOUS
  Filled 2022-11-04: qty 2

## 2022-11-04 NOTE — Progress Notes (Signed)
Pt transported from [facility] to [facility] and back by RN and RT w/o complications.

## 2022-11-04 NOTE — Consult Note (Signed)
[facility]                                                                                        Patient Identification: Patient Name: Nicole [name] MRN: [id number] Admit Date: 10/31/2022  2:40 PM Today's Date: 11/04/2022 Reason for consult: Cavitary pneumonia  Requesting provider: Dr Denese Killings  Principal Problem:   Acute respiratory failure (HCC)   Antibiotics:  Unasyn 6/19-c Doxycycline 6/18-c Cefepime 6/18-6/19 Vancomycin 6/18 Metronidazole 6/18  Lines/Hardware:  Assessment   Recommendations      Rest of the management as per the primary team. Please call with questions or concerns.  Thank you for the consult  __________________________________________________________________________________________________________ HPI and Hospital Course: 47 year old female with prior unknown medical history who presented to the ED on 6/18 with altered mental status, found down.  PT received call that unknown person was seen down in the field at an condemned building.  In ED he was unresponsive, hypoxic on 15 L nonrebreather, tachycardic, not following commands.  She was emergently intubated for airway protection for respiratory distress.     At ED, Found to be hypothermic.   Initial labs with WBC 43.6 serum osmolality 379, sodium 164, potassium 3.1 creatinine 2.5. Imagings as below CT head concerning for acute hypoxic/ischemic or metabolic/toxic injury with concerns for trace SAH CT CAP right greater than left lower lobe consolidations with numerous associated centrilobular nodules and groundglass opacities findings are most consistent with aspiration pneumonia.  Focal areas of bubble lucency are seen within the right lower lobe consolidation, which are concerning for cavitary infection or associated pulmonary infarct.  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 and small amount of bifrontal SAH  ID consulted for aspiration/Cavitary pna   ROS: General- Denies fever, chills, loss of appetite and loss of weight HEENT - Denies headache, blurry vision, neck pain, sinus pain Chest - Denies any chest pain, SOB or cough CVS- Denies any dizziness/lightheadedness, syncopal attacks, palpitations Abdomen- Denies any nausea, vomiting, abdominal pain, hematochezia and diarrhea Neuro - Denies any weakness, numbness, tingling sensation Psych - Denies any changes in mood irritability or depressive symptoms GU- Denies any burning, dysuria, hematuria or increased frequency of urination Skin - denies any rashes/lesions MSK - denies any joint pain/swelling or restricted ROM   History reviewed. No pertinent past medical history.  History reviewed. No pertinent surgical history.   Scheduled Meds:  Chlorhexidine Gluconate Cloth  6 each Topical Q0600   docusate  100 mg Per Tube BID   doxycycline  100 mg Per Tube Q12H   enoxaparin (LOVENOX) injection  40 mg Subcutaneous Q24H   famotidine  20 mg Per Tube BID   free water  200 mL Per Tube Q4H   insulin aspart  0-9 Units Subcutaneous Q4H   leptospermum manuka honey  1 Application Topical Daily   multivitamin with minerals  1 tablet Per Tube Daily   mupirocin ointment  1 Application Nasal BID   mouth rinse  15 mL Mouth Rinse Q2H   polyethylene  glycol  17 g Per Tube Daily   thiamine  100 mg Per Tube Daily   Continuous Infusions:  sodium chloride 10 mL/hr at 11/04/22 1502   ampicillin-sulbactam (UNASYN) IV 3 g (11/04/22 1502)   feeding supplement (VITAL AF 1.2 CAL) 50 mL/hr at 11/04/22 1100   PRN Meds:.sodium chloride, acetaminophen, fentaNYL (SUBLIMAZE) injection, fentaNYL (SUBLIMAZE) injection, ibuprofen, metoCLOPramide (REGLAN) injection, mouth rinse, pentafluoroprop-tetrafluoroeth,  polyethylene glycol  Not on File  Social History   Socioeconomic History   Marital status: Unknown    Spouse name: Not on file   Number of children: Not on file   Years of education: Not on file   Highest education level: Not on file  Occupational History   Not on file  Tobacco Use   Smoking status: Not on file   Smokeless tobacco: Not on file  Substance and Sexual Activity   Alcohol use: Not on file   Drug use: Not on file   Sexual activity: Not on file  Other Topics Concern   Not on file  Social History Narrative   Not on file   Social Determinants of Health   Financial Resource Strain: Not on file  Food Insecurity: Not on file  Transportation Needs: Not on file  Physical Activity: Not on file  Stress: Not on file  Social Connections: Not on file  Intimate Partner Violence: Not on file   History reviewed. No pertinent family history.  Vitals BP 113/76   Pulse 77   Temp 99.6 F (37.6 C) (Bladder)   Resp (!) 29   Ht 5\' 1"  (1.549 m)   Wt 52.2 kg   SpO2 100%   BMI 21.74 kg/m   Physical Exam Constitutional:      Comments:   Cardiovascular:     Rate and Rhythm: Normal rate and regular rhythm.     Heart sounds: No murmur heard.   Pulmonary:     Effort: Pulmonary effort is normal.     Comments:   Abdominal:     Palpations: Abdomen is soft.     Tenderness:   Musculoskeletal:        General: No swelling or tenderness.   Skin:    Comments:   Neurological:     General: No focal deficit present.   Psychiatric:        Mood and Affect: Mood normal.    Pertinent Microbiology Results for orders placed or performed during the hospital encounter of 10/31/22  Blood Culture (Routine X 2)     Status: None (Preliminary result)   Collection Time: 10/31/22  2:53 PM   Specimen: BLOOD  Result Value Ref Range Status   Specimen Description BLOOD SITE NOT SPECIFIED  Final   Special Requests   Final    BOTTLES DRAWN AEROBIC AND ANAEROBIC Blood Culture results  may not be optimal due to an excessive volume of blood received in culture bottles   Culture   Final    NO GROWTH 4 DAYS Performed at [facility], [address] [place], [place] [address]    Report Status PENDING  Incomplete  Resp panel by RT-PCR (RSV, Flu A&B, Covid) Anterior Nasal Swab     Status: None   Collection Time: 10/31/22  4:02 PM   Specimen: Anterior Nasal Swab  Result Value Ref Range Status   SARS Coronavirus 2 by RT PCR NEGATIVE NEGATIVE Final   Influenza A by PCR NEGATIVE NEGATIVE Final   Influenza B by PCR NEGATIVE NEGATIVE Final  Comment: (NOTE) The Xpert Xpress SARS-CoV-2/FLU/RSV plus assay is intended as an aid in the diagnosis of influenza from Nasopharyngeal swab specimens and should not be used as a sole basis for treatment. Nasal washings and aspirates are unacceptable for Xpert Xpress SARS-CoV-2/FLU/RSV testing.  Fact Sheet for Patients: BloggerCourse.com  Fact Sheet for Healthcare Providers: SeriousBroker.it  This test is not yet approved or cleared by the [place] FDA and has been authorized for detection and/or diagnosis of SARS-CoV-2 by FDA under an Emergency Use Authorization (EUA). This EUA will remain in effect (meaning this test can be used) for the duration of the COVID-19 declaration under Section 564(b)(1) of the Act, 21 U.S.C. section 360bbb-3(b)(1), unless the authorization is terminated or revoked.     Resp Syncytial Virus by PCR NEGATIVE NEGATIVE Final    Comment: (NOTE) Fact Sheet for Patients: BloggerCourse.com  Fact Sheet for Healthcare Providers: SeriousBroker.it  This test is not yet approved or cleared by the [place] FDA and has been authorized for detection and/or diagnosis of SARS-CoV-2 by FDA under an Emergency Use Authorization (EUA). This EUA will remain in effect (meaning this test can be used) for the  duration of the COVID-19 declaration under Section 564(b)(1) of the Act, 21 U.S.C. section 360bbb-3(b)(1), unless the authorization is terminated or revoked.  Performed at [facility], [address] [place], [place] [address]   Blood Culture (Routine X 2)     Status: None (Preliminary result)   Collection Time: 10/31/22  4:29 PM   Specimen: BLOOD RIGHT ARM  Result Value Ref Range Status   Specimen Description BLOOD RIGHT ARM  Final   Special Requests   Final    BOTTLES DRAWN AEROBIC ONLY Blood Culture results may not be optimal due to an inadequate volume of blood received in culture bottles   Culture   Final    NO GROWTH 4 DAYS Performed at [facility], [address], [place], [place] [address]    Report Status PENDING  Incomplete  MRSA Next Gen by PCR, Nasal     Status: Abnormal   Collection Time: 10/31/22  8:22 PM   Specimen: Nasal Mucosa; Nasal Swab  Result Value Ref Range Status   MRSA by PCR Next Gen DETECTED (A) NOT DETECTED Final    Comment: RESULT CALLED TO, READ BACK BY AND VERIFIED WITH: CUMMINGS RN 10/31/22 @ 2253 BY AB (NOTE) The GeneXpert MRSA Assay (FDA approved for NASAL specimens only), is one component of a comprehensive MRSA colonization surveillance program. It is not intended to diagnose MRSA infection nor to guide or monitor treatment for MRSA infections. Test performance is not FDA approved in patients less than 16 years old. Performed at [facility], [address], [place], [place] [address]   Culture, Respiratory w Gram Stain (tracheal aspirate)     Status: None (Preliminary result)   Collection Time: 11/02/22 10:06 AM   Specimen: Tracheal Aspirate; Respiratory  Result Value Ref Range Status   Specimen Description TRACHEAL ASPIRATE  Final   Special Requests NONE  Final   Gram Stain   Final    ABUNDANT WBC PRESENT,BOTH PMN AND MONONUCLEAR FEW GRAM POSITIVE COCCI IN PAIRS RARE GRAM NEGATIVE RODS RARE GRAM POSITIVE RODS     Culture   Final    CULTURE REINCUBATED FOR BETTER GROWTH Performed at [facility], [address], [place], [place] [address]    Report Status PENDING  Incomplete   Pertinent Lab seen  by me:    Latest Ref Rng & Units 11/04/2022    4:16 AM 11/02/2022    5:58 AM 11/02/2022    3:37 AM  CBC  WBC 4.0 - 10.5 K/uL 31.2  16.4    Hemoglobin 12.0 - 15.0 g/dL 40.9  81.1  91.4   Hematocrit 36.0 - 46.0 % 38.6  37.4  37.0   Platelets 150 - 400 K/uL 144  111        Latest Ref Rng & Units 11/04/2022    4:16 AM 11/03/2022    4:21 PM 11/03/2022    3:42 AM  CMP  Glucose 70 - 99 mg/dL 782  94  956   BUN 6 - 20 mg/dL 18  12  15    Creatinine 0.44 - 1.00 mg/dL 2.13  0.86  5.78   Sodium 135 - 145 mmol/L 137  143  142   Potassium 3.5 - 5.1 mmol/L 4.2  3.5  3.4   Chloride 98 - 111 mmol/L 107  120  110   CO2 22 - 32 mmol/L 24  17  22    Calcium 8.9 - 10.3 mg/dL 8.4  6.0  7.5   Total Protein 6.5 - 8.1 g/dL   5.4   Total Bilirubin 0.3 - 1.2 mg/dL   0.8   Alkaline Phos 38 - 126 U/L   49   AST 15 - 41 U/L   39   ALT 0 - 44 U/L   95      Pertinent Imagings/Other Imagings Plain films and CT images have been personally visualized and interpreted; radiology reports have been reviewed. Decision making incorporated into the Impression / Recommendations.  [place] EKG SITE RITE  Result Date: 11/04/2022 If [facility] image not attached, placement could not be confirmed due to current cardiac rhythm.  CT CHEST WO CONTRAST  Result Date: 11/04/2022 CLINICAL DATA:  Pneumonia. EXAM: CT CHEST WITHOUT CONTRAST TECHNIQUE: Multidetector CT imaging of the chest was performed following the standard protocol without IV contrast. RADIATION DOSE REDUCTION: This exam was performed according to the departmental dose-optimization program which includes automated exposure control, adjustment of the mA and/or kV according to patient size and/or use of iterative reconstruction technique. COMPARISON:  10/31/2022 FINDINGS:  Cardiovascular: The heart size is normal. No substantial pericardial effusion. Right IJ central line tip projects at the mid to distal SVC level. NG tube extends into the stomach. Endotracheal tube tip is in the distal trachea. Mediastinum/Nodes: No mediastinal lymphadenopathy. No evidence for gross hilar lymphadenopathy although assessment is limited by the lack of intravenous contrast on the current study. The esophagus has normal imaging features. There is no axillary lymphadenopathy. Lungs/Pleura: 6 mm right middle lobe pulmonary nodule is similar to prior. Right lower lobe consolidative opacity again noted with progression of cavitary component now measuring 3.2 cm with layering fluid level. Patchy nodular left lower lobe airspace disease is similar with progression of confluent component in the paraspinal base. Fine architectural detail of lung parenchyma obscured by breathing motion. Trace bilateral pleural effusions evident. Upper Abdomen: Unremarkable. Musculoskeletal: No worrisome lytic or sclerotic osseous abnormality. IMPRESSION: 1. Interval progression of right lower lobe consolidative opacity with progression of cavitary component now measuring 3.2 cm with layering fluid level. Imaging features compatible with cavitary pneumonia. 2. Patchy nodular left lower lobe airspace disease is similar to prior with progression of confluent component in the paraspinal base. Imaging features compatible with pneumonia. 3. Trace bilateral pleural effusions. 4. 6 mm right middle lobe pulmonary nodule  is similar to prior. Non-contrast chest CT at 6-12 months is recommended. If the nodule is stable at time of repeat CT, then future CT at 18-24 months (from today's scan) is considered optional for low-risk patients, but is recommended for high-risk patients. This recommendation follows the consensus statement: Guidelines for Management of Incidental Pulmonary Nodules Detected on CT Images: From the Fleischner Society  2017; Radiology 2017; 284:228-243. Electronically Signed   By: Kennith Center M.D.   On: 11/04/2022 12:37   DG CHEST PORT 1 VIEW  Result Date: 11/03/2022 CLINICAL DATA:  Follow-up pneumonia EXAM: PORTABLE CHEST 1 VIEW COMPARISON:  11/01/2022 FINDINGS: Cardiac shadow is stable. Endotracheal tube, gastric catheter and right jugular central line are again seen and stable. Small right pleural effusion and basilar atelectasis is seen stable from the prior exam. No new focal abnormality is noted. IMPRESSION: No change from the previous exam. Electronically Signed   By: Alcide Clever M.D.   On: 11/03/2022 10:40   DG Chest Port 1 View  Result Date: 11/01/2022 CLINICAL DATA:  Evaluate for pneumonia. EXAM: PORTABLE CHEST 1 VIEW COMPARISON:  10/31/2022 FINDINGS: ETT tip is stable above the carina. There is a right IJ catheter with tip at the superior cavoatrial junction. Enteric tube courses below the level of the GE junction. Small right pleural effusion. Increased from previous exam. Persistent airspace opacities within the right base concerning for pneumonia. Retrocardiac opacity in the left base is also unchanged. IMPRESSION: 1. Small right pleural effusion. 2. Persistent bibasilar opacities concerning for pneumonia. 3. Stable support apparatus. Electronically Signed   By: Signa Kell M.D.   On: 11/01/2022 09:00   MR BRAIN WO CONTRAST  Result Date: 10/31/2022 CLINICAL DATA:  Possible hypoxic brain injury EXAM: MRI HEAD WITHOUT CONTRAST TECHNIQUE: Multiplanar, multiecho pulse sequences of the brain and surrounding structures were obtained without intravenous contrast. COMPARISON:  Head CT 10/31/2022 FINDINGS: Brain: There are symmetric areas of hyperintense T2-weighted signal within the globi pallidi. There is multifocal abnormal diffusion restriction within a bilateral deep watershed distribution in the corona radiata. No hydrocephalus. Bilateral small volume subarachnoid hemorrhage. There is questionable  hyperintense T2-weighted signal of the parietal cortex bilaterally on the FLAIR sequence. Vascular: Normal flow voids. Skull and upper cervical spine: Normal marrow signal. Sinuses/Orbits: Negative. Other: None. IMPRESSION: 1. Symmetric signal abnormality within the globi pallidi is most consistent with a toxic metabolic injury, such as carbon monoxide poisoning. 2. 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 3. Small amount of bifrontal subarachnoid hemorrhage, unchanged. Electronically Signed   By: Deatra Robinson M.D.   On: 10/31/2022 23:56   DG Chest Portable 1 View  Result Date: 10/31/2022 CLINICAL DATA:  Line placement EXAM: PORTABLE CHEST 1 VIEW COMPARISON:  X-ray 10/31/2022 and CT. FINDINGS: Hyperinflation. Persistent lung base opacities, right-greater-than-left. Trace right effusion. No pneumothorax or edema. Stable ET tube and enteric tube. New right IJ line with tip along the central SVC. IMPRESSION: New right IJ line.  No pneumothorax. Stable ET tube and enteric tube. Persistent lung base opacities, right-greater-than-left with a tiny right effusion. Previous lucency along left hemithorax is not persist. No definite pneumothorax on the current x-ray. Electronically Signed   By: Karen Kays M.D.   On: 10/31/2022 18:00   CT CHEST ABDOMEN PELVIS WO CONTRAST  Result Date: 10/31/2022 CLINICAL DATA:  Found down EXAM: CT CHEST, ABDOMEN AND PELVIS WITHOUT CONTRAST TECHNIQUE: Multidetector CT imaging of the chest, abdomen and pelvis was performed following the standard protocol without IV contrast. RADIATION  DOSE REDUCTION: This exam was performed according to the departmental dose-optimization program which includes automated exposure control, adjustment of the mA and/or kV according to patient size and/or use of iterative reconstruction technique. COMPARISON:  None Available. FINDINGS: CT CHEST FINDINGS Cardiovascular: Normal heart size. No esophagus and thyroid are unremarkable.  Pericardial effusion. Normal caliber thoracic aorta with no atherosclerotic disease. Mediastinum/Nodes: Esophagus and thyroid are unremarkable. No enlarged lymph nodes seen in the chest. Lungs/Pleura: Central airways are patent. Debris seen in the right lower lobe segmental bronchi. Right-greater-than-left lower lobe consolidations. Focal areas of bubbly lucency seen within the right lower lobe consolidation. Numerous centrilobular nodules and ground-glass opacities which are most pronounced in the bilateral lower lobes and posterior portions of the right middle lobe and bilateral upper lobes. Trace right pleural effusion. Musculoskeletal: No chest wall mass or suspicious bone lesions identified. CT ABDOMEN PELVIS FINDINGS Streak artifact related to patient positioning somewhat limits evaluation. Hepatobiliary: No focal liver abnormality. Gallbladder is unremarkable. Pancreas: Visualized portions are unremarkable. Spleen: Normal in size without focal abnormality. Adrenals/Urinary Tract: Bilateral adrenal glands are unremarkable. No hydronephrosis or nephrolithiasis. Bladder is partially decompressed and contains a Foley catheter. Stomach/Bowel: Enteric tube tip is in the distal stomach. Stomach is within normal limits. No evidence of bowel wall thickening, distention, or inflammatory changes. Vascular/Lymphatic: No significant vascular findings are present. No enlarged abdominal or pelvic lymph nodes. Reproductive: IUD in place.  No adnexal mass. Other: No abdominal wall hernia or abnormality. No abdominopelvic ascites. Musculoskeletal: Superior endplate lucency of L5, likely a Schmorl's node. No aggressive appearing osseous lesions. IMPRESSION: 1. Right-greater-than-left lower lobe consolidations with numerous associated centrilobular nodules and ground-glass opacities. Findings are most consistent with aspiration pneumonia. Focal areas of bubbly lucency are seen within the right lower lobe consolidation, which are  concerning for cavitary infection or associated pulmonary infarct. Consider chest CTA to exclude pulmonary embolus. 2. Trace right pleural effusion.  No pneumothorax. 3. Evaluation of the abdomen and pelvis is limited due to lack of IV contrast and streak artifact related to patient positioning. Within limitations, no acute abnormality of the abdomen or pelvis. Electronically Signed   By: Allegra Lai M.D.   On: 10/31/2022 17:11   CT HEAD WO CONTRAST  Result Date: 10/31/2022 CLINICAL DATA:  Head trauma, minor (Age >= 65y); ams abnormal edema within bilateral EXAM: CT HEAD WITHOUT CONTRAST CT CERVICAL SPINE WITHOUT CONTRAST TECHNIQUE: Multidetector CT imaging of the head and cervical spine was performed following the standard protocol without intravenous contrast. Multiplanar CT image reconstructions of the cervical spine were also generated. RADIATION DOSE REDUCTION: This exam was performed according to the departmental dose-optimization program which includes automated exposure control, adjustment of the mA and/or kV according to patient size and/or use of iterative reconstruction technique. COMPARISON:  None Available. FINDINGS: CT HEAD FINDINGS Brain: Abnormal symmetric edema within bilateral globus palladi. No evidence of acute hemorrhage, mass lesion, midline shift or hydrocephalus. Trace/equivocal hyperdensity along the high bilateral frontal sulci. No mass occupying acute hemorrhage. No hydrocephalus or herniation. Vascular: No hyperdense vessel identified. Skull: No acute fracture. Sinuses/Orbits: Clear sinuses.  No acute orbital findings. Other: No mastoid effusions. CT CERVICAL SPINE FINDINGS Alignment: Rotation of C1 on C2. No substantial sagittal subluxation. Skull base and vertebrae: Vertebral body heights are maintained. No evidence of acute fracture Soft tissues and spinal canal: No prevertebral fluid or swelling. No visible canal hematoma. Disc levels: Mild to moderate multilevel bony  degenerative change. Upper chest: Visualized lung apices are clear. IMPRESSION:  1. Abnormal symmetric edema within bilateral globus palladi concerning for acute hypoxic/ischemic or metabolic/toxic injury. Recommend MRI head to further assess. 2. Trace/equivocal hyperdensity along the high bilateral frontal sulci could be artifactual or trace subarachnoid hemorrhage. No mass effect. Recommend attention on the forthcoming MRI. If MRI is not obtained, recommend follow-up CT to ensure stability. 3. Rotation of C1 on C2, most likely positional in the absence of a fixed torticollis. 4. No evidence of acute fracture in the cervical spine. Electronically Signed   By: Feliberto Harts M.D.   On: 10/31/2022 17:00   CT Cervical Spine Wo Contrast  Result Date: 10/31/2022 CLINICAL DATA:  Head trauma, minor (Age >= 65y); ams abnormal edema within bilateral EXAM: CT HEAD WITHOUT CONTRAST CT CERVICAL SPINE WITHOUT CONTRAST TECHNIQUE: Multidetector CT imaging of the head and cervical spine was performed following the standard protocol without intravenous contrast. Multiplanar CT image reconstructions of the cervical spine were also generated. RADIATION DOSE REDUCTION: This exam was performed according to the departmental dose-optimization program which includes automated exposure control, adjustment of the mA and/or kV according to patient size and/or use of iterative reconstruction technique. COMPARISON:  None Available. FINDINGS: CT HEAD FINDINGS Brain: Abnormal symmetric edema within bilateral globus palladi. No evidence of acute hemorrhage, mass lesion, midline shift or hydrocephalus. Trace/equivocal hyperdensity along the high bilateral frontal sulci. No mass occupying acute hemorrhage. No hydrocephalus or herniation. Vascular: No hyperdense vessel identified. Skull: No acute fracture. Sinuses/Orbits: Clear sinuses.  No acute orbital findings. Other: No mastoid effusions. CT CERVICAL SPINE FINDINGS Alignment: Rotation of  C1 on C2. No substantial sagittal subluxation. Skull base and vertebrae: Vertebral body heights are maintained. No evidence of acute fracture Soft tissues and spinal canal: No prevertebral fluid or swelling. No visible canal hematoma. Disc levels: Mild to moderate multilevel bony degenerative change. Upper chest: Visualized lung apices are clear. IMPRESSION: 1. Abnormal symmetric edema within bilateral globus palladi concerning for acute hypoxic/ischemic or metabolic/toxic injury. Recommend MRI head to further assess. 2. Trace/equivocal hyperdensity along the high bilateral frontal sulci could be artifactual or trace subarachnoid hemorrhage. No mass effect. Recommend attention on the forthcoming MRI. If MRI is not obtained, recommend follow-up CT to ensure stability. 3. Rotation of C1 on C2, most likely positional in the absence of a fixed torticollis. 4. No evidence of acute fracture in the cervical spine. Electronically Signed   By: Feliberto Harts M.D.   On: 10/31/2022 17:00   DG Chest Port 1 View  Result Date: 10/31/2022 CLINICAL DATA:  Post intubation EXAM: PORTABLE CHEST 1 VIEW COMPARISON:  None Available. FINDINGS: ET tube in place with the tip 3 cm above the carina. Enteric tube extends beneath the diaphragm. Mild opacity along the medial right lower lobe. Minimal medial left lower lobe. Left-sided apical pneumothorax suspected. No effusion or edema. IMPRESSION: ET tube and enteric tube in place. Small left apical pneumothorax. Recommend further workup such as chest CT. Lung base opacities, right-greater-than-left. Critical Value/emergent results were called by telephone at the time of interpretation on 10/31/2022 at 12:55 pm to provider Dr. Gayleen Orem, who verbally acknowledged these results. Electronically Signed   By: Karen Kays M.D.   On: 10/31/2022 16:07    I have personally spent 85 minutes involved in face-to-face and non-face-to-face activities for this patient on the day of the visit.  Professional time spent includes the following activities: Preparing to see the patient (review of tests), Obtaining and/or reviewing separately obtained history (admission/discharge record), Performing a medically appropriate examination and/or  evaluation , Ordering medications/tests/procedures, referring and communicating with other health care professionals, Documenting clinical information in the EMR, Independently interpreting results (not separately reported), Communicating results to the patient/family/caregiver, Counseling and educating the patient/family/caregiver and Care coordination (not separately reported).  Electronically signed by:   Plan d/w requesting provider as well as ID pharm D  Note: This document was prepared using dragon voice recognition software and may include unintentional dictation errors.   Odette Fraction, MD Infectious Disease Physician [facility] Pager: [phone]

## 2022-11-04 NOTE — Progress Notes (Signed)
NAME:  Nicole [name], MRN:  [id number], DOB:  05/15/1873, LOS: 4 ADMISSION DATE:  10/31/2022, CONSULTATION DATE:  6/18 REFERRING MD:  Jarold Motto, CHIEF COMPLAINT:  6/18   History of Present Illness:   This is a female patient who initially is on identified an unknown.  She was seen by bystander sitting in a field next to a condemned building.  She was unresponsive, and EMS was notified.  She was hypoxic upon EMS arrival requiring 15 L nonrebreather in effort to oxygenate.  On arrival to the emergency room she remained unresponsive, tachypneic, diaphoretic, and critically ill initial white blood cell count 43.6 serum osmolality is 379 sodium 164 potassium 3.1 creatinine 2.5..  She was intubated for airway protection and hypoxic respiratory failure portable chest x-ray was obtained this showed right basilar airspace disease, CT head was obtained that showed symmetric edema within bilateral globus labia concerning for an ischemic/hypoxic injury CT of chest abdomen and pelvis showing right greater than left lower lobe consolidation nodular groundglass opacities with lucency in the right lower lobe raising concern for cavitary infection.  Cultures were sent.  Vancomycin and cefepime initiated.  Critical care was asked to admit Pertinent  Medical History  No current history available  Significant Hospital Events: Including procedures, antibiotic start and stop dates in addition to other pertinent events   6/18 admitted after being found unresponsive and hypoxic with bilateral pneumonia right greater than left, severe dehydration and hypernatremia, AKI, and concern for possible anoxic injury intubated by EDP antibiotics initiated.  She was unresponsive postintubation and during intubation process 6/19 MRI shows bilateral globus pallidus injury 6/22 sedation now following commands predominantly on the right side.  Interim History / Subjective:  Tolerating SBT requiring only intermittent  fentanyl.  Objective   Blood pressure 123/89, pulse 96, temperature 98.9 F (37.2 C), temperature source Bladder, resp. rate (!) 33, height 5\' 1"  (1.549 m), weight 52.2 kg, SpO2 100 %.    Vent Mode: PSV;CPAP FiO2 (%):  [40 %] 40 % Set Rate:  [24 bmp] 24 bmp Vt Set:  [380 mL] 380 mL PEEP:  [5 cmH20] 5 cmH20 Pressure Support:  [10 cmH20] 10 cmH20 Plateau Pressure:  [16 cmH20] 16 cmH20   Intake/Output Summary (Last 24 hours) at 11/04/2022 1034 Last data filed at 11/04/2022 0600 Gross per 24 hour  Intake 1768.28 ml  Output 2335 ml  Net -566.72 ml    Filed Weights   11/02/22 0500 11/03/22 0500 11/04/22 0500  Weight: 50.7 kg 53.5 kg 52.2 kg    Examination: General: critically ill appearing woman lying in bed in NAD.  Appears malnourished and ill kempt. HENT: NNC/AT, eyes anicteric Lungs: Tolerating PSV.  Chest clear bilaterally. Cardiovascular: S1S2, RRR Abdomen: soft, NT Extremities: no cyanosis or edema Neuro: PERRL, RASS 0, following commands on the right side readily.  Not moving the left Derm: sunburned on front of body  Ancillary tests personally reviewed  Creatinine normal 0.74. Marked hypoalbuminemia at less than 1.5. Marked leukocytosis at 31.2 CT chest shows open right lower lobe infiltrate with distinct 3 cm cavitary lesion. 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 Elevated LFTs secondary to shock liver resolving. Moderate protein energy malnutrition Mild refeeding  syndrome Hypophosphatermia, hypokalemia, hypocalcemia  Plan:  -Keep off sedation to allow for best neurological recovery -Intermittent sedation only if possible -Pneumonia appears to be progressing on current antibiotic therapy.  Consult infectious diseases as will need prolonged antibiotic therapy for 4 to 6 weeks.  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 -Continue to progress tube feeds as per RD -Daily SBT.     Best Practice (right click and "Reselect all SmartList Selections" daily)   Diet/type: tubefeeds DVT prophylaxis: LMWH GI prophylaxis: H2B Lines: Central line Foley:  Yes, and it is still needed Code Status:  full code Last date of multidisciplinary goals of care discussion [no family at bedside, no known relatives]   This patient is critically ill with multiple organ system failure which requires frequent high complexity decision making, assessment, support, evaluation, and titration of therapies. This was completed through the application of advanced monitoring technologies and extensive interpretation of multiple databases. During this encounter critical care time was devoted to patient care services described in this note for 45 minutes.  Lynnell Catalan, MD 11/04/22 10:34 AM [facility]  For contact information, see Amion. If no response to pager, please call PCCM consult pager. After hours, 7PM- 7AM, please call Elink.

## 2022-11-04 NOTE — Progress Notes (Signed)
[facility] ADULT ICU REPLACEMENT PROTOCOL   The patient does apply for the [facility] Adult ICU Electrolyte Replacment Protocol based on the criteria listed below:   1.Exclusion criteria: TCTS, ECMO, Dialysis, and Myasthenia Gravis patients 2. Is GFR >/= 30 ml/min? Yes.    Patient's GFR today is 57 3. Is SCr </= 2? Yes.   Patient's SCr is 0.71 mg/dL 4. Did SCr increase >/= 0.5 in 24 hours? No. 5.[name] weight >40kg  Yes.   6. Abnormal electrolyte(s): Mag 1.9  7. Electrolytes replaced per protocol 8.  Call MD STAT for K+ </= 2.5, Phos </= 1, or Mag </= 1 Physician:  [facility] 11/04/2022 5:17 AM

## 2022-11-04 NOTE — Progress Notes (Signed)
PICC order received.  CVC in place.  Will plan on PICC placement Sunday.  Dr Drucie Ip and Lequita Halt RN aware via secure chat.

## 2022-11-04 NOTE — Progress Notes (Addendum)
eLink Physician-Brief Progress Note Patient Name: Nicole [name] DOB: 04-18-1976 MRN: [id number]   Date of Service  11/04/2022  HPI/Events of Note  Received request for bilateral wrist restraints.   Pt is intubated and has reached for ETT.   eICU Interventions  Ok to apply bilateral wrist restraints.      Intervention Category Intermediate Interventions: Other:  Larinda Buttery 11/04/2022, 11:14 PM  11:38 PM Notified of hypotension.   Pt was given fentanyl PRN and BP dopped to 89/45.   Plan> Give 500cc LR bolus.   12:23 AM Notified of bradycardia with rate in the 50s, SBP in the 80s.   Plan> Given atropine 1mg  IV with improvement in HR 80s-90s. SBP improved as well.  Continue to monitor.  6:27 AM Notified of hypotension with BP 87/46, HR 66.   Plan> Give 500cc LR bolus.

## 2022-11-05 DIAGNOSIS — F1990 Other psychoactive substance use, unspecified, uncomplicated: Secondary | ICD-10-CM

## 2022-11-05 DIAGNOSIS — J984 Other disorders of lung: Secondary | ICD-10-CM

## 2022-11-05 DIAGNOSIS — J189 Pneumonia, unspecified organism: Secondary | ICD-10-CM | POA: Diagnosis not present

## 2022-11-05 DIAGNOSIS — J9601 Acute respiratory failure with hypoxia: Secondary | ICD-10-CM | POA: Diagnosis not present

## 2022-11-05 LAB — CBC WITH DIFFERENTIAL/PLATELET
Abs Immature Granulocytes: 0.34 10*3/uL — ABNORMAL HIGH (ref 0.00–0.07)
Basophils Absolute: 0.1 10*3/uL (ref 0.0–0.1)
Basophils Relative: 0 %
Eosinophils Absolute: 0.1 10*3/uL (ref 0.0–0.5)
Eosinophils Relative: 0 %
HCT: 35.9 % — ABNORMAL LOW (ref 36.0–46.0)
Hemoglobin: 11.9 g/dL — ABNORMAL LOW (ref 12.0–15.0)
Immature Granulocytes: 2 %
Lymphocytes Relative: 9 %
Lymphs Abs: 2.1 10*3/uL (ref 0.7–4.0)
MCH: 32.2 pg (ref 26.0–34.0)
MCHC: 33.1 g/dL (ref 30.0–36.0)
MCV: 97 fL (ref 80.0–100.0)
Monocytes Absolute: 0.8 10*3/uL (ref 0.1–1.0)
Monocytes Relative: 3 %
Neutro Abs: 19.6 10*3/uL — ABNORMAL HIGH (ref 1.7–7.7)
Neutrophils Relative %: 86 %
Platelets: 158 10*3/uL (ref 150–400)
RBC: 3.7 MIL/uL — ABNORMAL LOW (ref 3.87–5.11)
RDW: 12.4 % (ref 11.5–15.5)
WBC: 23 10*3/uL — ABNORMAL HIGH (ref 4.0–10.5)
nRBC: 0 % (ref 0.0–0.2)

## 2022-11-05 LAB — CULTURE, BLOOD (ROUTINE X 2)

## 2022-11-05 LAB — GLUCOSE, CAPILLARY
Glucose-Capillary: 80 mg/dL (ref 70–99)
Glucose-Capillary: 122 mg/dL — ABNORMAL HIGH (ref 70–99)
Glucose-Capillary: 130 mg/dL — ABNORMAL HIGH (ref 70–99)
Glucose-Capillary: 65 mg/dL — ABNORMAL LOW (ref 70–99)
Glucose-Capillary: 88 mg/dL (ref 70–99)
Glucose-Capillary: 63 mg/dL — ABNORMAL LOW (ref 70–99)
Glucose-Capillary: 116 mg/dL — ABNORMAL HIGH (ref 70–99)
Glucose-Capillary: 111 mg/dL — ABNORMAL HIGH (ref 70–99)

## 2022-11-05 LAB — BASIC METABOLIC PANEL
Anion gap: 7 (ref 5–15)
BUN: 19 mg/dL (ref 6–20)
CO2: 26 mmol/L (ref 22–32)
Calcium: 7.9 mg/dL — ABNORMAL LOW (ref 8.9–10.3)
Chloride: 106 mmol/L (ref 98–111)
Creatinine, Ser: 0.75 mg/dL (ref 0.44–1.00)
GFR, Estimated: >60 mL/min (ref >60)
Glucose, Bld: 114 mg/dL — ABNORMAL HIGH (ref 70–99)
Potassium: 3.8 mmol/L (ref 3.5–5.1)
Sodium: 139 mmol/L (ref 135–145)

## 2022-11-05 LAB — PHOSPHORUS: Phosphorus: 5.6 mg/dL — ABNORMAL HIGH (ref 2.5–4.6)

## 2022-11-05 LAB — MAGNESIUM: Magnesium: 1.9 mg/dL (ref 1.7–2.4)

## 2022-11-05 LAB — CULTURE, RESPIRATORY W GRAM STAIN

## 2022-11-05 MED ORDER — ATROPINE SULFATE 1 MG/10ML IJ SOSY
PREFILLED_SYRINGE | INTRAMUSCULAR | Status: AC
  Administered 2022-11-05: 1 mg via INTRAVENOUS
  Filled 2022-11-05: qty 10

## 2022-11-05 MED ORDER — SODIUM CHLORIDE 0.9% FLUSH
10.0000 mL | Freq: Two times a day (BID) | INTRAVENOUS | Status: DC
  Administered 2022-11-05 (×2): 20 mL
  Administered 2022-11-06 – 2022-11-07 (×3): 10 mL
  Administered 2022-11-07: 20 mL
  Administered 2022-11-08 – 2022-11-18 (×17): 10 mL

## 2022-11-05 MED ORDER — DEXTROSE 50 % IV SOLN
12.5000 g | INTRAVENOUS | Status: AC
  Administered 2022-11-05: 12.5 g via INTRAVENOUS

## 2022-11-05 MED ORDER — SODIUM CHLORIDE 0.9% FLUSH: 10.0000 mL | INTRAVENOUS | Status: DC | PRN

## 2022-11-05 MED ORDER — DEXTROSE 50 % IV SOLN
INTRAVENOUS | Status: AC
  Administered 2022-11-05: 12.5 g via INTRAVENOUS
  Filled 2022-11-05: qty 50

## 2022-11-05 MED ORDER — ATROPINE SULFATE 1 MG/10ML IJ SOSY: 1.0000 mg | PREFILLED_SYRINGE | Freq: Once | INTRAMUSCULAR | Status: AC

## 2022-11-05 MED ORDER — MAGNESIUM SULFATE 2 GM/50ML IV SOLN
2.0000 g | Freq: Once | INTRAVENOUS | Status: AC
  Administered 2022-11-05: 2 g via INTRAVENOUS
  Filled 2022-11-05: qty 50

## 2022-11-05 MED ORDER — DEXTROSE 50 % IV SOLN: 12.5000 g | INTRAVENOUS | Status: AC

## 2022-11-05 MED ORDER — ORAL CARE MOUTH RINSE: 15.0000 mL | OROMUCOSAL | Status: DC | PRN

## 2022-11-05 MED ORDER — LACTATED RINGERS IV BOLUS
500.0000 mL | Freq: Once | INTRAVENOUS | Status: AC
  Administered 2022-11-05: 500 mL via INTRAVENOUS

## 2022-11-05 MED ORDER — POTASSIUM CHLORIDE 20 MEQ PO PACK
20.0000 meq | PACK | Freq: Once | ORAL | Status: AC
  Administered 2022-11-05: 20 meq
  Filled 2022-11-05: qty 1

## 2022-11-05 MED ORDER — DEXTROSE 5 % AND 0.9 % NACL IV BOLUS
1000.0000 mL | Freq: Once | INTRAVENOUS | Status: AC
  Administered 2022-11-05: 500 mL via INTRAVENOUS

## 2022-11-05 MED ORDER — DEXTROSE 50 % IV SOLN
INTRAVENOUS | Status: AC
  Filled 2022-11-05: qty 50

## 2022-11-05 NOTE — Procedures (Signed)
Extubation Procedure Note  Patient Details:   Name: Nicole [name] DOB: 06-Apr-1976 MRN: [id number]   Airway Documentation:  Airway 7.5 mm (Active)  Secured at (cm) 23 cm 11/05/22 0800  Measured From Lips 11/05/22 0800  Secured Location Right 11/05/22 0740  Secured By [employer] 11/05/22 0740  Tube Holder Repositioned Yes 11/05/22 0740  Prone position No 11/05/22 0740  Cuff Pressure (cm H2O) Clear OR 27-39 [facility] 11/04/22 0741  Site Condition Dry 11/05/22 0740   Vent end date: (not recorded) Vent end time: (not recorded)   Evaluation  O2 sats: stable throughout Complications: No apparent complications Patient did tolerate procedure well. Bilateral Breath Sounds: Clear, Diminished   Yes Pt extubated to 2L [place] Positive cuff leak  Jaceon Heiberger V 11/05/2022, 8:58 AM

## 2022-11-05 NOTE — Progress Notes (Signed)
NAME:  Nicole [name], MRN:  [id number], DOB:  [birth year], LOS: 5 ADMISSION DATE:  10/31/2022, CONSULTATION DATE:  6/18 REFERRING MD:  Jarold Motto, CHIEF COMPLAINT:  6/18   History of Present Illness:   This is a female patient who initially is on identified an unknown.  She was seen by bystander sitting in a field next to a condemned building.  She was unresponsive, and EMS was notified.  She was hypoxic upon EMS arrival requiring 15 L nonrebreather in effort to oxygenate.  On arrival to the emergency room she remained unresponsive, tachypneic, diaphoretic, and critically ill initial white blood cell count 43.6 serum osmolality is 379 sodium 164 potassium 3.1 creatinine 2.5..  She was intubated for airway protection and hypoxic respiratory failure portable chest x-ray was obtained this showed right basilar airspace disease, CT head was obtained that showed symmetric edema within bilateral globus labia concerning for an ischemic/hypoxic injury CT of chest abdomen and pelvis showing right greater than left lower lobe consolidation nodular groundglass opacities with lucency in the right lower lobe raising concern for cavitary infection.  Cultures were sent.  Vancomycin and cefepime initiated.  Critical care was asked to admit Pertinent  Medical History  No current history available.  Spoke to mother yesterday. No history of major medical illnesses. 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   Significant Hospital Events: Including procedures, antibiotic start and stop dates in addition to other pertinent events   6/18 admitted after being found unresponsive and hypoxic with bilateral pneumonia right greater than left, severe dehydration and hypernatremia, AKI, and concern for possible anoxic injury intubated by EDP antibiotics initiated.  She was unresponsive  postintubation and during intubation process 6/19 MRI shows bilateral globus pallidus injury 6/22 sedation now following commands predominantly on the right side.  Interim History / Subjective:  Tolerating SBT. Awake and following commands. Strong cough.   Objective   Blood pressure 99/72, pulse 68, temperature 98 F (36.7 C), temperature source Axillary, resp. rate 19, height 5\' 1"  (1.549 m), weight 52.5 kg, SpO2 100 %.    Vent Mode: PSV;CPAP FiO2 (%):  [40 %] 40 % Set Rate:  [24 bmp] 24 bmp Vt Set:  [380 mL] 380 mL PEEP:  [5 cmH20] 5 cmH20 Pressure Support:  [10 cmH20] 10 cmH20   Intake/Output Summary (Last 24 hours) at 11/05/2022 0845 Last data filed at 11/05/2022 0830 Gross per 24 hour  Intake 3221.05 ml  Output 3405 ml  Net -183.95 ml    Filed Weights   11/03/22 0500 11/04/22 0500 11/05/22 0500  Weight: 53.5 kg 52.2 kg 52.5 kg    Examination: General: critically ill appearing woman lying in bed in NAD.  Appears malnourished and ill kempt. HENT: NNC/AT, eyes anicteric Lungs: Tolerating PSV.  Chest clear bilaterally. Cardiovascular: S1S2, RRR Abdomen: soft, NT Extremities: no cyanosis or edema Neuro: PERRL, RASS 0, following commands on the right side readily.  Still favoring right side but now able to move the left. Derm: sunburned on front of body  Ancillary tests personally reviewed  Creatinine normal 0.75 Marked hypoalbuminemia at less than 1.5. Marked leukocytosis at 23, much improved CT chest shows open right lower lobe infiltrate with distinct 3 cm cavitary lesion. 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 Elevated LFTs secondary to shock liver resolving. Moderate protein energy malnutrition Mild refeeding syndrome Hypophosphatermia, hypokalemia, hypocalcemia  Plan:  -Sufficiently awake to allow for extubation. -PT OT once extubated may need neurocognitive evaluation -Pneumonia appears to be progressing on current antibiotic therapy.  Consult infectious diseases as will need prolonged antibiotic therapy for 4 to 6 weeks.  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 -ID has recommended continuing Unasyn and adding Levaquin for MRSA coverage. -Transition to oral feeds post extubation   Daily Goals Checklist  Pain/Anxiety/Delirium protocol (if indicated): Intermittent medication only.  Can discontinue postextubation VAP protocol (if indicated): Incentive spirometry postextubation Respiratory support goals: Extubate today Blood pressure target: Allow autoregulation DVT prophylaxis: Lovenox Nutrition Status: High nutritional risk, calorie count postextubation. GI prophylaxis: Discontinue postextubation Fluid status goals: Currently auto diuresing Urinary catheter:  Removal ordered Central lines: Not applicable Glucose control: Euglycemic Mobility/therapy needs: PT postextubation Antibiotic de-escalation: Unasyn and linezolid for 4 weeks Restraints: Restraint type:  Discontinue postextubation Reason for restraints: Interference with medical treatment Home medication reconciliation: No home medication Daily labs: Ordered Code Status: Full Family Communication: Spoke to mother yesterday informed of condition Disposition: ICU.  May be ready for MedSurg tomorrow.  This patient is critically ill with multiple organ system failure which requires frequent high  complexity decision making, assessment, support, evaluation, and titration of therapies. This was completed through the application of advanced monitoring technologies and extensive interpretation of multiple databases. During this encounter critical care time was devoted to patient care services described in this note for 35 minutes.  Lynnell Catalan, MD 11/05/22 8:45 AM [facility]  For contact information, see Amion. If no response to pager, please call PCCM consult pager. After hours, 7PM- 7AM, please call Elink.    Allow autoregulation

## 2022-11-05 NOTE — Progress Notes (Signed)
eLink Physician-Brief Progress Note Patient Name: Nicole [name] DOB: 1975-07-28 MRN: [id number]   Date of Service  11/05/2022  HPI/Events of Note  Notified of Hypoglycemia.  Treated with D50, but now downtrending  eICU Interventions  Placed order for D5NS@40ml /hr        Carmelle Bamberg M DELA CRUZ 11/05/2022, 7:57 PM

## 2022-11-05 NOTE — Progress Notes (Signed)
Peripherally Inserted Central Catheter Placement  The IV Nurse has discussed with the patient and/or persons authorized to consent for the patient, the purpose of this procedure and the potential benefits and risks involved with this procedure.  The benefits include less needle sticks, lab draws from the catheter, and the patient may be discharged home with the catheter. Risks include, but not limited to, infection, bleeding, blood clot (thrombus formation), and puncture of an artery; nerve damage and irregular heartbeat and possibility to perform a PICC exchange if needed/ordered by physician.  Alternatives to this procedure were also discussed.  Bard Power PICC patient education guide, fact sheet on infection prevention and patient information card has been provided to patient /or left at bedside. Consent signed by mother, [name] at bedside.   PICC Placement Documentation  PICC Double Lumen 11/05/22 Right Brachial 35 cm 0 cm (Active)  Indication for Insertion or Continuance of Line Prolonged intravenous therapies 11/05/22 1124  Exposed Catheter (cm) 0 cm 11/05/22 1124  Site Assessment Clean, Dry, Intact 11/05/22 1124  Lumen #1 Status Saline locked;Flushed;Blood return noted 11/05/22 1124  Lumen #2 Status Saline locked;Flushed;Blood return noted 11/05/22 1124  Dressing Type Transparent;Securing device 11/05/22 1124  Dressing Status Antimicrobial disc in place;Clean, Dry, Intact 11/05/22 1124  Safety Lock Not Applicable 11/05/22 1124  Line Care Connections checked and tightened 11/05/22 1124  Line Adjustment (NICU/IV Team Only) No 11/05/22 1124  Dressing Intervention New dressing 11/05/22 1124  Dressing Change Due 11/12/22 11/05/22 1124       Burnard Bunting Chenice 11/05/2022, 11:25 AM

## 2022-11-05 NOTE — Progress Notes (Signed)
Patient's SBP has been running frequently in the 80s, however occasionally in the 90s. MD aware. Will watch for now

## 2022-11-06 ENCOUNTER — Inpatient Hospital Stay (HOSPITAL_COMMUNITY): Payer: Medicaid Other

## 2022-11-06 DIAGNOSIS — J9601 Acute respiratory failure with hypoxia: Secondary | ICD-10-CM | POA: Diagnosis not present

## 2022-11-06 DIAGNOSIS — R0609 Other forms of dyspnea: Secondary | ICD-10-CM | POA: Diagnosis not present

## 2022-11-06 DIAGNOSIS — B9561 Methicillin susceptible Staphylococcus aureus infection as the cause of diseases classified elsewhere: Secondary | ICD-10-CM | POA: Diagnosis not present

## 2022-11-06 DIAGNOSIS — R4182 Altered mental status, unspecified: Secondary | ICD-10-CM

## 2022-11-06 LAB — CBC WITH DIFFERENTIAL/PLATELET
Abs Immature Granulocytes: 0.13 10*3/uL — ABNORMAL HIGH (ref 0.00–0.07)
Basophils Absolute: 0 10*3/uL (ref 0.0–0.1)
Basophils Relative: 0 %
Eosinophils Absolute: 0 10*3/uL (ref 0.0–0.5)
Eosinophils Relative: 0 %
HCT: 33.5 % — ABNORMAL LOW (ref 36.0–46.0)
Hemoglobin: 11 g/dL — ABNORMAL LOW (ref 12.0–15.0)
Immature Granulocytes: 1 %
Lymphocytes Relative: 12 %
Lymphs Abs: 1.8 10*3/uL (ref 0.7–4.0)
MCH: 32.7 pg (ref 26.0–34.0)
MCHC: 32.8 g/dL (ref 30.0–36.0)
MCV: 99.7 fL (ref 80.0–100.0)
Monocytes Absolute: 0.9 10*3/uL (ref 0.1–1.0)
Monocytes Relative: 6 %
Neutro Abs: 12.4 10*3/uL — ABNORMAL HIGH (ref 1.7–7.7)
Neutrophils Relative %: 81 %
Platelets: 175 10*3/uL (ref 150–400)
RBC: 3.36 MIL/uL — ABNORMAL LOW (ref 3.87–5.11)
RDW: 12.3 % (ref 11.5–15.5)
WBC: 15.2 10*3/uL — ABNORMAL HIGH (ref 4.0–10.5)
nRBC: 0 % (ref 0.0–0.2)

## 2022-11-06 LAB — BASIC METABOLIC PANEL
Anion gap: 8 (ref 5–15)
BUN: 12 mg/dL (ref 6–20)
CO2: 23 mmol/L (ref 22–32)
Calcium: 7.4 mg/dL — ABNORMAL LOW (ref 8.9–10.3)
Chloride: 107 mmol/L (ref 98–111)
Creatinine, Ser: 0.71 mg/dL (ref 0.44–1.00)
GFR, Estimated: >60 mL/min (ref >60)
Glucose, Bld: 172 mg/dL — ABNORMAL HIGH (ref 70–99)
Potassium: 3.4 mmol/L — ABNORMAL LOW (ref 3.5–5.1)
Sodium: 138 mmol/L (ref 135–145)

## 2022-11-06 LAB — HEPATITIS B SURFACE ANTIGEN: Hepatitis B Surface Ag: NONREACTIVE

## 2022-11-06 LAB — ECHOCARDIOGRAM COMPLETE
Area-P 1/2: 3.46 cm2
Height: 61 in
MV M vel: 1.35 m/s
MV Peak grad: 7.3 mmHg
S' Lateral: 3 cm
Weight: 1784.84 oz

## 2022-11-06 LAB — CULTURE, RESPIRATORY W GRAM STAIN

## 2022-11-06 LAB — CALCIUM, IONIZED: Calcium, Ionized, Serum: 5.4 mg/dL (ref 4.5–5.6)

## 2022-11-06 LAB — GLUCOSE, CAPILLARY
Glucose-Capillary: 70 mg/dL (ref 70–99)
Glucose-Capillary: 87 mg/dL (ref 70–99)
Glucose-Capillary: 155 mg/dL — ABNORMAL HIGH (ref 70–99)
Glucose-Capillary: 173 mg/dL — ABNORMAL HIGH (ref 70–99)
Glucose-Capillary: 155 mg/dL — ABNORMAL HIGH (ref 70–99)
Glucose-Capillary: 167 mg/dL — ABNORMAL HIGH (ref 70–99)

## 2022-11-06 LAB — HIV ANTIBODY (ROUTINE TESTING W REFLEX): HIV Screen 4th Generation wRfx: NONREACTIVE

## 2022-11-06 LAB — HEPATITIS B CORE ANTIBODY, TOTAL: Hep B Core Total Ab: NONREACTIVE

## 2022-11-06 MED ORDER — POLYETHYLENE GLYCOL 3350 17 G PO PACK: 17.0000 g | PACK | Freq: Every day | ORAL | Status: DC | PRN

## 2022-11-06 MED ORDER — ENSURE ENLIVE PO LIQD
237.0000 mL | Freq: Two times a day (BID) | ORAL | Status: DC
  Administered 2022-11-06 – 2022-11-18 (×20): 237 mL via ORAL

## 2022-11-06 MED ORDER — DEXTROSE 5 % AND 0.45 % NACL IV BOLUS
1000.0000 mL | Freq: Once | INTRAVENOUS | Status: AC
  Administered 2022-11-06: 1000 mL via INTRAVENOUS

## 2022-11-06 MED ORDER — ACETAMINOPHEN 325 MG PO TABS
650.0000 mg | ORAL_TABLET | Freq: Four times a day (QID) | ORAL | Status: DC | PRN
  Administered 2022-11-08 – 2022-11-13 (×4): 650 mg via ORAL
  Filled 2022-11-06 (×4): qty 2

## 2022-11-06 MED ORDER — PERFLUTREN LIPID MICROSPHERE
1.0000 mL | INTRAVENOUS | Status: AC | PRN
  Administered 2022-11-06: 4 mL via INTRAVENOUS
  Filled 2022-11-06: qty 10

## 2022-11-06 MED ORDER — FUROSEMIDE 10 MG/ML IJ SOLN
40.0000 mg | Freq: Every day | INTRAMUSCULAR | Status: DC
  Administered 2022-11-06 – 2022-11-08 (×3): 40 mg via INTRAVENOUS
  Filled 2022-11-06 (×3): qty 4

## 2022-11-06 MED ORDER — POTASSIUM CHLORIDE 10 MEQ/50ML IV SOLN
10.0000 meq | INTRAVENOUS | Status: AC
  Administered 2022-11-06 (×6): 10 meq via INTRAVENOUS
  Filled 2022-11-06 (×6): qty 50

## 2022-11-06 MED ORDER — ORAL CARE MOUTH RINSE: 15.0000 mL | OROMUCOSAL | Status: DC | PRN

## 2022-11-06 MED ORDER — THIAMINE MONONITRATE 100 MG PO TABS
100.0000 mg | ORAL_TABLET | Freq: Every day | ORAL | Status: AC
  Administered 2022-11-06 – 2022-11-08 (×3): 100 mg via ORAL
  Filled 2022-11-06 (×3): qty 1

## 2022-11-06 MED ORDER — ADULT MULTIVITAMIN W/MINERALS CH
1.0000 | ORAL_TABLET | Freq: Every day | ORAL | Status: DC
  Administered 2022-11-06 – 2022-11-18 (×12): 1 via ORAL
  Filled 2022-11-06 (×12): qty 1

## 2022-11-06 MED ORDER — IBUPROFEN 200 MG PO TABS
200.0000 mg | ORAL_TABLET | Freq: Four times a day (QID) | ORAL | Status: DC | PRN
  Administered 2022-11-08: 200 mg via ORAL
  Filled 2022-11-06: qty 1

## 2022-11-06 MED ORDER — SODIUM CHLORIDE 0.9 % IV SOLN
3.0000 g | Freq: Four times a day (QID) | INTRAVENOUS | Status: DC
  Administered 2022-11-06: 3 g via INTRAVENOUS
  Filled 2022-11-06: qty 8

## 2022-11-06 NOTE — Progress Notes (Signed)
[facility]  Date of Admission:  10/31/2022     Total days of antibiotics 7         ASSESSMENT:  Nicole [name] continues to improve remaining afebrile and improved leukocytosis. Respiratory cultures with Staphylococcus aureus with sensitivities pending. Echocardiogram pending. Tolerating extubation. Will continue with current dose of ampicillin/sulbactam and linezolid pending respiratory culture results and will narrow appropriately. Therapeutic drug monitoring of platelet levels while on linezolid. Remaining medical and supportive care per CCM.  PLAN:  Continue current dose of ampicillin/sulbactam and linezolid. Monitor cultures for Staphylococcus aureus sensitivities. Await echocardiogram results. Remaining medical and supportive care per CCM.  I have personally spent 23 minutes involved in face-to-face and non-face-to-face activities for this patient on the day of the visit. Professional time spent includes the following activities: Preparing to see the patient (review of tests), Obtaining and/or reviewing separately obtained history (admission/discharge record), Performing a medically appropriate examination and/or evaluation , Ordering medications/tests/procedures, referring and communicating with other health care professionals, Documenting clinical information in the EMR, Independently interpreting results (not separately reported), Communicating results to the patient/family/caregiver, Counseling and educating the patient/family/caregiver and Care coordination (not separately reported).    Principal Problem:   Acute respiratory failure (HCC) Active Problems:   Cavitary pneumonia   Substance use    Chlorhexidine Gluconate Cloth  6 each Topical Q0600   enoxaparin (LOVENOX) injection  40 mg Subcutaneous Q24H   furosemide  40 mg Intravenous Daily   leptospermum manuka honey  1 Application Topical Daily   multivitamin with minerals  1 tablet Oral Daily    mouth rinse  15 mL Mouth Rinse Q2H   sodium chloride flush  10-40 mL Intracatheter Q12H   thiamine  100 mg Oral Daily    SUBJECTIVE:  Afebrile overnight with no acute events. Able to eat some soft foods today. Mother at bedside.   Not on File   Review of Systems: Review of Systems  Constitutional:  Positive for malaise/fatigue. Negative for chills, fever and weight loss.  Respiratory:  Negative for cough, shortness of breath and wheezing.   Cardiovascular:  Negative for chest pain and leg swelling.  Gastrointestinal:  Negative for abdominal pain, constipation, diarrhea, nausea and vomiting.  Skin:  Negative for rash.  Neurological:  Positive for weakness.      OBJECTIVE: Vitals:   11/06/22 0930 11/06/22 1000 11/06/22 1030 11/06/22 1116  BP: 100/62 96/68 (!) 103/57   Pulse: 75 77 77   Resp: (!) 33 17 20   Temp:    97.9 F (36.6 C)  TempSrc:    Oral  SpO2: 100% 100% 100%   Weight:      Height:       Body mass index is 21.08 kg/m.  Physical Exam Constitutional:      General: She is not in acute distress.    Appearance: She is well-developed.  Cardiovascular:     Rate and Rhythm: Normal rate and regular rhythm.     Heart sounds: Normal heart sounds.  Pulmonary:     Effort: Pulmonary effort is normal.     Breath sounds: Normal breath sounds.  Skin:    General: Skin is warm and dry.  Neurological:     Mental Status: She is alert and oriented to person, place, and time.  Psychiatric:        Mood and Affect: Mood normal.     Lab Results Lab Results  Component Value Date   WBC 15.2 (H)  11/06/2022   HGB 11.0 (L) 11/06/2022   HCT 33.5 (L) 11/06/2022   MCV 99.7 11/06/2022   PLT 175 11/06/2022    Lab Results  Component Value Date   CREATININE 0.71 11/06/2022   BUN 12 11/06/2022   NA 138 11/06/2022   K 3.4 (L) 11/06/2022   CL 107 11/06/2022   CO2 23 11/06/2022    Lab Results  Component Value Date   ALT 95 (H) 11/03/2022   AST 39 11/03/2022   ALKPHOS  49 11/03/2022   BILITOT 0.8 11/03/2022     Microbiology: Recent Results (from the past 240 hour(s))  Blood Culture (Routine X 2)     Status: None   Collection Time: 10/31/22  2:53 PM   Specimen: BLOOD  Result Value Ref Range Status   Specimen Description BLOOD SITE NOT SPECIFIED  Final   Special Requests   Final    BOTTLES DRAWN AEROBIC AND ANAEROBIC Blood Culture results may not be optimal due to an excessive volume of blood received in culture bottles   Culture   Final    NO GROWTH 5 DAYS Performed at [facility], [address] [place], [place] [address]    Report Status 11/05/2022 FINAL  Final  Resp panel by RT-PCR (RSV, Flu A&B, Covid) Anterior Nasal Swab     Status: None   Collection Time: 10/31/22  4:02 PM   Specimen: Anterior Nasal Swab  Result Value Ref Range Status   SARS Coronavirus 2 by RT PCR NEGATIVE NEGATIVE Final   Influenza A by PCR NEGATIVE NEGATIVE Final   Influenza B by PCR NEGATIVE NEGATIVE Final    Comment: (NOTE) The Xpert Xpress SARS-CoV-2/FLU/RSV plus assay is intended as an aid in the diagnosis of influenza from Nasopharyngeal swab specimens and should not be used as a sole basis for treatment. Nasal washings and aspirates are unacceptable for Xpert Xpress SARS-CoV-2/FLU/RSV testing.  Fact Sheet for Patients: BloggerCourse.com  Fact Sheet for Healthcare Providers: SeriousBroker.it  This test is not yet approved or cleared by the [place] FDA and has been authorized for detection and/or diagnosis of SARS-CoV-2 by FDA under an Emergency Use Authorization (EUA). This EUA will remain in effect (meaning this test can be used) for the duration of the COVID-19 declaration under Section 564(b)(1) of the Act, 21 U.S.C. section 360bbb-3(b)(1), unless the authorization is terminated or revoked.     Resp Syncytial Virus by PCR NEGATIVE NEGATIVE Final    Comment: (NOTE) Fact Sheet for  Patients: BloggerCourse.com  Fact Sheet for Healthcare Providers: SeriousBroker.it  This test is not yet approved or cleared by the [place] FDA and has been authorized for detection and/or diagnosis of SARS-CoV-2 by FDA under an Emergency Use Authorization (EUA). This EUA will remain in effect (meaning this test can be used) for the duration of the COVID-19 declaration under Section 564(b)(1) of the Act, 21 U.S.C. section 360bbb-3(b)(1), unless the authorization is terminated or revoked.  Performed at [facility], [address] [place], [place] [address]   Blood Culture (Routine X 2)     Status: None   Collection Time: 10/31/22  4:29 PM   Specimen: BLOOD RIGHT ARM  Result Value Ref Range Status   Specimen Description BLOOD RIGHT ARM  Final   Special Requests   Final    BOTTLES DRAWN AEROBIC ONLY Blood Culture results may not be optimal due to an inadequate volume of blood received in culture bottles   Culture  Final    NO GROWTH 5 DAYS Performed at [facility], [address] [place], [place] [address]    Report Status 11/05/2022 FINAL  Final  MRSA Next Gen by PCR, Nasal     Status: Abnormal   Collection Time: 10/31/22  8:22 PM   Specimen: Nasal Mucosa; Nasal Swab  Result Value Ref Range Status   MRSA by PCR Next Gen DETECTED (A) NOT DETECTED Final    Comment: RESULT CALLED TO, READ BACK BY AND VERIFIED WITH: CUMMINGS RN 10/31/22 @ 2253 BY AB (NOTE) The GeneXpert MRSA Assay (FDA approved for NASAL specimens only), is one component of a comprehensive MRSA colonization surveillance program. It is not intended to diagnose MRSA infection nor to guide or monitor treatment for MRSA infections. Test performance is not FDA approved in patients less than 7 years old. Performed at [facility], [address], [place], [place] [address]   Culture, Respiratory w Gram Stain (tracheal aspirate)     Status:  None (Preliminary result)   Collection Time: 11/02/22 10:06 AM   Specimen: Tracheal Aspirate; Respiratory  Result Value Ref Range Status   Specimen Description TRACHEAL ASPIRATE  Final   Special Requests NONE  Final   Gram Stain   Final    ABUNDANT WBC PRESENT,BOTH PMN AND MONONUCLEAR FEW GRAM POSITIVE COCCI IN PAIRS RARE GRAM NEGATIVE RODS RARE GRAM POSITIVE RODS    Culture   Final    MODERATE STAPHYLOCOCCUS AUREUS SUSCEPTIBILITIES TO FOLLOW Performed at [facility], [address], [place], [place] [address]    Report Status PENDING  Incomplete     Marcos Eke, NP [facility]  11/06/2022  12:19 PM

## 2022-11-06 NOTE — Progress Notes (Signed)
Nutrition Follow-up  DOCUMENTATION CODES:  Not applicable  INTERVENTION:  Continue current diet as ordered per SLP Continue MVI with minerals daily Ensure Enlive po BID, each supplement provides 350 kcal and 20 grams of protein.  NUTRITION DIAGNOSIS:  Inadequate oral intake related to inability to eat as evidenced by NPO status. - remains applicable  GOAL:  Patient will meet greater than or equal to 90% of their needs - progressing, previously met with TF at goal  MONITOR:  Diet advancement, Labs, Weight trends, Skin  REASON FOR ASSESSMENT:  Ventilator, Consult Enteral/tube feeding initiation and management  ASSESSMENT:  Nicole [name] presented to the ED after being found unresponsive in a field. Unknown PMH. Pt intubated for airway protection, admitted with AKI, bilateral pneumonia, dehydration, hypernatremia, and acute metabolic encephalopathy.    6/18 Nicole [name], presented to ED intubated 6/23 - extubated 6/24 - SLP evaluated, DYS3 diet, thin liquids  Pt with several providers at the time of visit. Able to be extubated yesterday. Discussed with RN, pt much more alert and oriented today. SLP able to place on a diet this AM. Will add nutrition supplements to augment intake and encourage adequate PO.     Intake/Output Summary (Last 24 hours) at 11/06/2022 0909 Last data filed at 11/06/2022 0800 Gross per 24 hour  Intake 1235.72 ml  Output 3035 ml  Net -1799.28 ml  Net IO Since Admission: 2,865.89 mL [11/06/22 0909]  Nutritionally Relevant Medications: Scheduled Meds:  furosemide  40 mg Intravenous Daily   multivitamin with minerals  1 tablet Oral Daily   thiamine  100 mg Oral Daily   Continuous Infusions:  ampicillin-sulbactam (UNASYN) IV     linezolid (ZYVOX) IV Stopped (11/05/22 2256)   potassium chloride 10 mEq (11/06/22 0820)   PRN Meds: metoCLOPramide, polyethylene glycol  Labs Reviewed: K 3.4 CBG ranges from 63-130 mg/dL over the last 24 hours  NUTRITION -  FOCUSED PHYSICAL EXAM: Flowsheet Row Most Recent Value  Orbital Region Unable to assess  Upper Arm Region No depletion  Thoracic and Lumbar Region No depletion  Buccal Region Unable to assess  Temple Region No depletion  Clavicle Bone Region Mild depletion  Clavicle and Acromion Bone Region Mild depletion  Scapular Bone Region Mild depletion  Dorsal Hand Unable to assess  Patellar Region No depletion  Anterior Thigh Region Mild depletion  Posterior Calf Region No depletion  Edema (RD Assessment) None  Hair Reviewed  Eyes Unable to assess  Mouth Unable to assess  Skin Reviewed  Nails Unable to assess   Diet Order:   Diet Order             Diet NPO time specified  Diet effective now                  EDUCATION NEEDS: Not appropriate for education at this time  Skin:  Skin Assessment: Skin Integrity Issues: Skin Integrity Issues:: Unstageable Unstageable: L Buttocks  Last BM:  6/23 - type 7  Height:  Ht Readings from Last 1 Encounters:  10/31/22 5\' 1"  (1.549 m)   Weight:  Wt Readings from Last 1 Encounters:  11/06/22 50.6 kg   Ideal Body Weight:  47.7 kg  BMI:  Body mass index is 21.08 kg/m.  Estimated Nutritional Needs:  Kcal:  1600-1800 Protein:  80-100 grams Fluid:  >/= 1.6 L    Nicole [name], RD, LDN Clinical Dietitian RD pager # available in [facility]  After hours/weekend pager # available in [facility]

## 2022-11-06 NOTE — Progress Notes (Signed)
  Echocardiogram 2D Echocardiogram has been performed.  Nicole [name] 11/06/2022, 2:09 PM

## 2022-11-06 NOTE — Progress Notes (Signed)
NAME:  Nicole [name], MRN:  [id number], DOB:  Apr 12, 1976, LOS: 6 ADMISSION DATE:  10/31/2022, CONSULTATION DATE:  6/18 REFERRING MD:  Jarold Motto, CHIEF COMPLAINT:  6/18   History of Present Illness:   This is a female patient who initially is on identified an unknown.  She was seen by bystander sitting in a field next to a condemned building.  She was unresponsive, and EMS was notified.  She was hypoxic upon EMS arrival requiring 15 L nonrebreather in effort to oxygenate.  On arrival to the emergency room she remained unresponsive, tachypneic, diaphoretic, and critically ill initial white blood cell count 43.6 serum osmolality is 379 sodium 164 potassium 3.1 creatinine 2.5..  She was intubated for airway protection and hypoxic respiratory failure portable chest x-ray was obtained this showed right basilar airspace disease, CT head was obtained that showed symmetric edema within bilateral globus labia concerning for an ischemic/hypoxic injury CT of chest abdomen and pelvis showing right greater than left lower lobe consolidation nodular groundglass opacities with lucency in the right lower lobe raising concern for cavitary infection.  Cultures were sent.  Vancomycin and cefepime initiated.  Critical care was asked to admit Pertinent  Medical History  No current history available.  Spoke to mother yesterday. No history of major medical illnesses. 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   Significant Hospital Events: Including procedures, antibiotic start and stop dates in addition to other pertinent events   6/18 admitted after being found unresponsive and hypoxic with bilateral pneumonia right greater than left, severe dehydration and hypernatremia, AKI, and concern for possible anoxic injury intubated by EDP antibiotics initiated.  She was unresponsive  postintubation and during intubation process 6/19 MRI shows bilateral globus pallidus injury 6/22 sedation now following commands predominantly on the right side.  Interim History / Subjective:  No events, weak.  Objective   Blood pressure 100/62, pulse 83, temperature 98.2 F (36.8 C), temperature source Oral, resp. rate (!) 21, height 5\' 1"  (1.549 m), weight 50.6 kg, SpO2 100 %.    FiO2 (%):  [40 %] 40 %   Intake/Output Summary (Last 24 hours) at 11/06/2022 0746 Last data filed at 11/06/2022 0600 Gross per 24 hour  Intake 1376.25 ml  Output 3272 ml  Net -1895.75 ml    Filed Weights   11/04/22 0500 11/05/22 0500 11/06/22 0500  Weight: 52.2 kg 52.5 kg 50.6 kg    Examination: Chronically ill +muscle wasting + anasaraca L>R weakness Mumbling speech pattern but oriented  Patient Lines/Drains/Airways Status     Active Line/Drains/Airways     Name Placement date Placement time Site Days   PICC Double Lumen 11/05/22 Right Brachial 35 cm 0 cm 11/05/22  1120  -- 1   Flatus Tube/Pouch 11/05/22  1400  --  1   External Urinary Catheter 11/05/22  1400  --  1   Pressure Injury 10/31/22 Buttocks Left Unstageable - Full thickness tissue loss in which the base of the injury is covered by slough (yellow, tan, gray, green or brown) and/or eschar (tan, brown or black) in the wound bed. 10/31/22  2000  -- 6   Pressure Injury 11/05/22 Head Posterior;Upper Unstageable - Full thickness tissue loss in which the base of the injury is covered by slough (yellow, tan, gray, green or brown) and/or eschar (tan, brown or black) in the wound  bed. 11/05/22  2200  -- 1             Ancillary tests personally reviewed  Creatinine normal 0.75 Marked hypoalbuminemia at less than 1.5. Marked leukocytosis at 23, much improved CT chest shows open right lower lobe infiltrate with distinct 3 cm cavitary lesion. 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 Elevated LFTs secondary to shock liver resolving. Moderate protein energy malnutrition Mild refeeding syndrome Hypophosphatermia, hypokalemia, hypocalcemia  Plan:  - Linezolid and unasyn per ID, baseline TTE although looks more to me like lobar PNA with necrotizing component/abscess; from pulmonary abscess standpoint would do at least 2 weeks abx; needs f/u CXR in about 8-10 weeks with consideration for CT if continues to be abnormal - Replete K, start diuresis - PT/OT/SLP input appreciated; need to figure out nutrition - D5 0.45NS until tolerates PO: try to get this off as we are also trying to diurese - Substance abuse rehab resources: CM resources  Stable for transfer out of ICU, appreciate [facility] taking over 6/25 am; remaining issues are abx plan (ID), PO tolerance (SLP), PT/OT recs; would not be surprised if needs rehab and/or SNF

## 2022-11-06 NOTE — Progress Notes (Signed)
Inpatient Rehab Admissions Coordinator:   Per therapy recommendations, patient was screened for CIR candidacy by Megan Salon, MS, CCC-SLP . At this time, Pt. is total A with mobility and not at a level to tolerate the intensity of CIR. However,  Pt. may have potential to progress to becoming a potential CIR candidate, so CIR admissions team will follow and monitor for progress and participation with therapies and place consult order if Pt. appears to be an appropriate candidate. Please contact me with any questions.   Megan Salon, MS, CCC-SLP Rehab Admissions Coordinator  [phone] (celll) [phone] (office)

## 2022-11-06 NOTE — Evaluation (Signed)
Occupational Therapy Evaluation Patient Details Name: Nicole [name] MRN: [id number] DOB: [birth year] Today's Date: 11/06/2022   History of Present Illness 47 y.o. female admitted 6/18 after being found down and unresponsive. She was brought in by EMS and intubated in ED (ETT 6/18-23). Dx includes bilateral pneumonia right greater than left, severe dehydration and hypernatremia, AKI, and concern for possible anoxic injury. MRI head 6/18: "Symmetric signal abnormality within the globi pallidi is most  consistent with a toxic metabolic injury, such as carbon monoxide  poisoning. 2. 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  3. Small amount of bifrontal SAH". PMH includes remote history of drug abuse.   Clinical Impression   Pt evaluated s/p above admission list. Per pt's mom, pt was independent with ADL/IADLs, driving and functional mobility without use of AD. Pt presents this session with poor command following, decreased memory, oriented to self only, decreased LUE strength/ROM, and poor balance. Trace LUE movement upon command, spontaneous elbow and digit flexion noted with activity, difficult to fully assess 2/2 cognition. RUE shoulder flexion limited to approx 90 degrees this date. Pt currently requires mod A for seated UB ADLs and max-total A for LB ADLs. Pt required total A for bed mobility and x2 STS transfers from EOB via bilat HHA. Pt would benefit from continued acute OT services to maximize functional independence and facilitate transition to intensive inpatient follow up therapy, >3 hours/day after discharge.      Recommendations for follow up therapy are one component of a multi-disciplinary discharge planning process, led by the attending physician.  Recommendations may be updated based on patient status, additional functional criteria and insurance authorization.   Assistance Recommended at Discharge Frequent or constant Supervision/Assistance  Patient can return home  with the following Two people to help with walking and/or transfers;Two people to help with bathing/dressing/bathroom;Assistance with cooking/housework;Direct supervision/assist for medications management;Direct supervision/assist for financial management;Assist for transportation;Help with stairs or ramp for entrance    Functional Status Assessment  Patient has had a recent decline in their functional status and demonstrates the ability to make significant improvements in function in a reasonable and predictable amount of time.  Equipment Recommendations  Other (comment) (defer)    Recommendations for Other Services Rehab consult     Precautions / Restrictions Precautions Precautions: Fall Restrictions Weight Bearing Restrictions: No      Mobility Bed Mobility Overal bed mobility: Needs Assistance Bed Mobility: Rolling, Supine to Sit, Sit to Supine Rolling: Total assist   Supine to sit: Total assist, +2 for physical assistance, +2 for safety/equipment, HOB elevated Sit to supine: Total assist, +2 for physical assistance, +2 for safety/equipment, HOB elevated   General bed mobility comments: Required trunk and BLE support    Transfers Overall transfer level: Needs assistance Equipment used: 2 person hand held assist Transfers: Sit to/from Stand Sit to Stand: Total assist, +2 physical assistance, +2 safety/equipment           General transfer comment: STS transfers from EOB x2 trials with total A +2 via bilat HHA. No knee buckling noted this session.      Balance Overall balance assessment: Needs assistance Sitting-balance support: Bilateral upper extremity supported, Feet supported Sitting balance-Leahy Scale: Poor Sitting balance - Comments: sitting EOB, requires max A for support   Standing balance support: Bilateral upper extremity supported Standing balance-Leahy Scale: Zero Standing balance comment: total A +2  ADL either  performed or assessed with clinical judgement   ADL Overall ADL's : Needs assistance/impaired Eating/Feeding: Moderate assistance;Sitting   Grooming: Moderate assistance;Sitting Grooming Details (indicate cue type and reason): washed face and L hand with washcloth using RUE while sitting EOB with max A for sitting balance and mod cues for thoroughness. L hand placed in lap by therapist during task. Upper Body Bathing: Moderate assistance;Sitting   Lower Body Bathing: Maximal assistance;+2 for physical assistance;+2 for safety/equipment;Sit to/from stand   Upper Body Dressing : Moderate assistance;Sitting   Lower Body Dressing: Total assistance;Sit to/from stand;+2 for safety/equipment;+2 for physical assistance Lower Body Dressing Details (indicate cue type and reason): total A to don bilat socks at bed level; washed LLE with max A for EOB balance and reaching distal lower extremity. Toilet Transfer: Total assistance;+2 for safety/equipment;+2 for physical assistance;BSC/3in1 Toilet Transfer Details (indicate cue type and reason): simulated Toileting- Clothing Manipulation and Hygiene: Total assistance;Sit to/from stand       Functional mobility during ADLs: +2 for physical assistance;Total assistance;+2 for safety/equipment General ADL Comments: limited secondary to cognition, LUE weakness, decreased balance.     Vision Baseline Vision/History: 0 No visual deficits Ability to See in Adequate Light: 0 Adequate Vision Assessment?: No apparent visual deficits Additional Comments: Difficult to assess 2/2 cognition, will further assess.     Perception Perception Perception Tested?: No   Praxis Praxis Praxis tested?: Not tested    Pertinent Vitals/Pain Pain Assessment Pain Assessment: Faces Faces Pain Scale: Hurts little more Pain Location: generalized pain Pain Descriptors / Indicators: Grimacing, Guarding Pain Intervention(s): Limited activity within patient's tolerance,  Monitored during session     Hand Dominance Right   Extremity/Trunk Assessment Upper Extremity Assessment Upper Extremity Assessment: LUE deficits/detail RUE Deficits / Details: Difficult to assess 2/2 cognition. RUE shoulder flexion limited to approx 90 degrees. LUE Deficits / Details: L inattention and weakness, OT formally assessed LUE Coordination: decreased fine motor;decreased gross motor   Lower Extremity Assessment Lower Extremity Assessment: RLE deficits/detail;LLE deficits/detail;Difficult to assess due to impaired cognition RLE Deficits / Details: 2/5 knee extension, other MMT limited by pt command following LLE Deficits / Details: no active movement observed or appreciated during atempted MMT, able to stand without knee blocking so anticipate pt has 3/5 knee extensor strength.   Cervical / Trunk Assessment Cervical / Trunk Assessment: Kyphotic;Other exceptions Cervical / Trunk Exceptions: holds head in cervical flexion   Communication Communication Communication: No difficulties   Cognition Arousal/Alertness: Awake/alert Behavior During Therapy: Flat affect Overall Cognitive Status: Impaired/Different from baseline Area of Impairment: Orientation, Attention, Memory, Following commands, Safety/judgement, Awareness, Problem solving                 Orientation Level: Disoriented to, Place, Time, Situation Current Attention Level: Focused Memory: Decreased short-term memory Following Commands: Follows one step commands with increased time Safety/Judgement: Decreased awareness of safety, Decreased awareness of deficits Awareness: Intellectual Problem Solving: Slow processing, Decreased initiation, Difficulty sequencing, Requires verbal cues, Requires tactile cues General Comments: A+O to self only, requires verbal/tactile cues and increased time to follow 1 step commands, unable to accurately recall home environment per mother.     General Comments  skin breakdown  on scalp, RN-documented. VSS    Exercises     Shoulder Instructions      Home Living Family/patient expects to be discharged to:: Private residence Living Arrangements: Parent (mother) Available Help at Discharge: Family;Other (Comment) (pt's mother works x2 days a week) Type of Home: House Home Access:  Level entry     Home Layout: One level     Bathroom Shower/Tub: Producer, television/film/video: Standard     Home Equipment: Cane - single point   Additional Comments: Currently lives with mom who works a couple days per week; home setup info gathered from pt's mom      Prior Functioning/Environment Prior Level of Function : Independent/Modified Independent;Patient poor historian/Family not available             Mobility Comments: per pt and mother, pt is independent with mobility PTA. [name] states pt would be gone for up to a week at a time ADLs Comments: pt reports independence        OT Problem List: Decreased strength;Decreased range of motion;Decreased activity tolerance;Impaired balance (sitting and/or standing);Decreased coordination;Decreased cognition;Decreased safety awareness;Decreased knowledge of use of DME or AE;Decreased knowledge of precautions;Impaired UE functional use;Pain      OT Treatment/Interventions: Self-care/ADL training;Neuromuscular education;Energy conservation;DME and/or AE instruction;Therapeutic activities;Cognitive remediation/compensation;Patient/family education;Balance training    OT Goals(Current goals can be found in the care plan section) Acute Rehab OT Goals Patient Stated Goal: to lay back down OT Goal Formulation: With patient Time For Goal Achievement: 11/20/22 Potential to Achieve Goals: Good ADL Goals Pt Will Perform Lower Body Dressing: with mod assist;sit to/from stand Pt Will Transfer to Toilet: with mod assist;bedside commode;stand pivot transfer Additional ADL Goal #1: Pt will utilize LUE as a functional  assist during ADL tasks Additional ADL Goal #2: Pt will sequence 2-step ADL task with min cues  OT Frequency: Min 2X/week    Co-evaluation PT/OT/SLP Co-Evaluation/Treatment: Yes Reason for Co-Treatment: Complexity of the patient's impairments (multi-system involvement);For patient/therapist safety;To address functional/ADL transfers PT goals addressed during session: Mobility/safety with mobility;Balance;Strengthening/ROM OT goals addressed during session: ADL's and self-care      AM-PAC OT "6 Clicks" Daily Activity     Outcome Measure Help from another person eating meals?: A Lot Help from another person taking care of personal grooming?: A Lot Help from another person toileting, which includes using toliet, bedpan, or urinal?: Total Help from another person bathing (including washing, rinsing, drying)?: A Lot Help from another person to put on and taking off regular upper body clothing?: A Lot Help from another person to put on and taking off regular lower body clothing?: Total 6 Click Score: 10   End of Session Equipment Utilized During Treatment: Oxygen Nurse Communication: Mobility status  Activity Tolerance: Patient tolerated treatment well Patient left: in bed;with call bell/phone within reach;with bed alarm set;with family/visitor present  OT Visit Diagnosis: Unsteadiness on feet (R26.81);Muscle weakness (generalized) (M62.81);Other symptoms and signs involving cognitive function                Time: 1131-1206 OT Time Calculation (min): 35 min Charges:  OT General Charges $OT Visit: 1 Visit OT Evaluation $OT Eval Moderate Complexity: 1 Mod  Sherley Bounds, OTS Acute Rehabilitation Services Office [phone] Secure Chat Communication Preferred   Sherley Bounds 11/06/2022, 2:11 PM

## 2022-11-06 NOTE — Evaluation (Signed)
Physical Therapy Evaluation Patient Details Name: Nicole [name] MRN: [id number] DOB: 06/24/1975 Today's Date: 11/06/2022  History of Present Illness  47 y.o. female admitted 6/18 after being found down and unresponsive. She was brought in by EMS and intubated in ED (ETT 6/18-23). Dx includes bilateral pneumonia right greater than left, severe dehydration and hypernatremia, AKI, and concern for possible anoxic injury. MRI head 6/18: "Symmetric signal abnormality within the globi pallidi is most  consistent with a toxic metabolic injury, such as carbon monoxide  poisoning. 2. 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  3. Small amount of bifrontal SAH". PMH includes remote history of drug abuse.  Clinical Impression   Pt presents with weakness L>R, impaired cognition, decreased activity tolerance, poor sitting and standing balance. Pt to benefit from acute PT to address deficits. Pt requiring max-total +2 assist for bed mobility and transition into standing x2, pt with limited command following for strength assessment but does stand without LE blocking after PT and OT assisted pt up. Per pt's mother, pt completely independent PTA, would benefit from intensive therapy post-acutely. PT to progress mobility as tolerated, and will continue to follow acutely.      BP 80s-90s/50s-60s   Recommendations for follow up therapy are one component of a multi-disciplinary discharge planning process, led by the attending physician.  Recommendations may be updated based on patient status, additional functional criteria and insurance authorization.  Follow Up Recommendations       Assistance Recommended at Discharge Frequent or constant Supervision/Assistance  Patient can return home with the following  Two people to help with walking and/or transfers;Two people to help with bathing/dressing/bathroom    Equipment Recommendations None recommended by PT (tbd)  Recommendations for Other  Services       Functional Status Assessment Patient has had a recent decline in their functional status and demonstrates the ability to make significant improvements in function in a reasonable and predictable amount of time.     Precautions / Restrictions Precautions Precautions: Fall Restrictions Weight Bearing Restrictions: No      Mobility  Bed Mobility Overal bed mobility: Needs Assistance Bed Mobility: Supine to Sit, Sit to Supine, Rolling Rolling: Total assist   Supine to sit: Total assist, +2 for physical assistance Sit to supine: Total assist, +2 for physical assistance   General bed mobility comments: all aspects    Transfers Overall transfer level: Needs assistance Equipment used: 2 person hand held assist Transfers: Sit to/from Stand Sit to Stand: +2 physical assistance, Total assist           General transfer comment: total +2 for all aspects, guarding LEs but no buckling noted. stand x2 attempts    Ambulation/Gait               General Gait Details: nt  Stairs            Wheelchair Mobility    Modified Rankin (Stroke Patients Only) Modified Rankin (Stroke Patients Only) Pre-Morbid Rankin Score: No symptoms Modified Rankin: Severe disability     Balance Overall balance assessment: Needs assistance Sitting-balance support: Bilateral upper extremity supported, Feet supported Sitting balance-Leahy Scale: Poor Sitting balance - Comments: brief periods of min assist, typically requiring mod-max assist to maintain static upright sitting. Anterior and posterior bias depending on head and shoulder position   Standing balance support: Bilateral upper extremity supported Standing balance-Leahy Scale: Zero Standing balance comment: total +2  Pertinent Vitals/Pain Pain Assessment Pain Assessment: Faces Faces Pain Scale: Hurts little more Pain Location: generalized with mobility Pain Descriptors /  Indicators: Sore, Discomfort, Grimacing Pain Intervention(s): Monitored during session, Repositioned, Limited activity within patient's tolerance    Home Living Family/patient expects to be discharged to:: Private residence Living Arrangements: Parent (mother) Available Help at Discharge: Family;Other (Comment) (pt's mother works x2 days a week) Type of Home: House Home Access: Level entry       Home Layout: One level Home Equipment: Cane - single point Additional Comments: Currently lives with mom who works a couple days per week; home setup info gathered from pt's mom    Prior Function Prior Level of Function : Independent/Modified Independent;Patient poor historian/Family not available             Mobility Comments: per pt and mother, pt is independent with mobility PTA. [name] states pt would be gone for up to a week at a time ADLs Comments: pt reports independence     Hand Dominance   Dominant Hand: Right    Extremity/Trunk Assessment   Upper Extremity Assessment Upper Extremity Assessment: LUE deficits/detail RUE Deficits / Details: Difficult to assess 2/2 cognition. RUE shoulder flexion limited to approx 90 degrees. LUE Deficits / Details: L inattention and weakness, OT formally assessed LUE Coordination: decreased fine motor;decreased gross motor    Lower Extremity Assessment Lower Extremity Assessment: RLE deficits/detail;LLE deficits/detail;Difficult to assess due to impaired cognition RLE Deficits / Details: 2/5 knee extension, other MMT limited by pt command following LLE Deficits / Details: no active movement observed or appreciated during atempted MMT, able to stand without knee blocking so anticipate pt has 3/5 knee extensor strength.    Cervical / Trunk Assessment Cervical / Trunk Assessment: Kyphotic;Other exceptions Cervical / Trunk Exceptions: holds head in cervical flexion  Communication   Communication: No difficulties  Cognition  Arousal/Alertness: Lethargic Behavior During Therapy: Flat affect Overall Cognitive Status: Impaired/Different from baseline Area of Impairment: Orientation, Attention, Following commands, Safety/judgement, Problem solving                 Orientation Level: Person, Time, Situation, Disoriented to (oriented to month of birthday only, states year is "60, no [birth year]25") Current Attention Level: Focused   Following Commands: Follows one step commands with increased time (with repeated cuing) Safety/Judgement: Decreased awareness of safety, Decreased awareness of deficits   Problem Solving: Slow processing, Decreased initiation, Difficulty sequencing, Requires verbal cues, Requires tactile cues (max, repeated cues)          General Comments General comments (skin integrity, edema, etc.): skin breakdown on scalp, RN-documented. VSS    Exercises     Assessment/Plan    PT Assessment Patient needs continued PT services  PT Problem List Decreased strength;Decreased mobility;Decreased activity tolerance;Decreased balance;Decreased safety awareness;Decreased coordination;Pain;Decreased knowledge of use of DME;Decreased knowledge of precautions       PT Treatment Interventions DME instruction;Therapeutic activities;Gait training;Patient/family education;Therapeutic exercise;Balance training;Stair training;Functional mobility training;Neuromuscular re-education    PT Goals (Current goals can be found in the Care Plan section)  Acute Rehab PT Goals Patient Stated Goal: per mother, return to PLOF PT Goal Formulation: With patient/family Time For Goal Achievement: 11/20/22 Potential to Achieve Goals: Fair    Frequency Min 4X/week     Co-evaluation PT/OT/SLP Co-Evaluation/Treatment: Yes Reason for Co-Treatment: Complexity of the patient's impairments (multi-system involvement);For patient/therapist safety;To address functional/ADL transfers PT goals addressed during session:  Mobility/safety with mobility;Balance;Strengthening/ROM OT goals addressed during session: ADL's and self-care  AM-PAC PT "6 Clicks" Mobility  Outcome Measure Help needed turning from your back to your side while in a flat bed without using bedrails?: Total Help needed moving from lying on your back to sitting on the side of a flat bed without using bedrails?: Total Help needed moving to and from a bed to a chair (including a wheelchair)?: Total Help needed standing up from a chair using your arms (e.g., wheelchair or bedside chair)?: Total Help needed to walk in hospital room?: Total Help needed climbing 3-5 steps with a railing? : Total 6 Click Score: 6    End of Session   Activity Tolerance: Patient limited by fatigue;Patient limited by lethargy Patient left: in bed;with call bell/phone within reach;with bed alarm set;with nursing/sitter in room;with SCD's reapplied;with family/visitor present Nurse Communication: Mobility status PT Visit Diagnosis: Other abnormalities of gait and mobility (R26.89);Muscle weakness (generalized) (M62.81);Hemiplegia and hemiparesis Hemiplegia - Right/Left: Left Hemiplegia - dominant/non-dominant: Non-dominant Hemiplegia - caused by: Cerebral infarction    Time: 1131-1206 PT Time Calculation (min) (ACUTE ONLY): 35 min   Charges:   PT Evaluation $PT Eval Low Complexity: 1 Low          Julliette Frentz S, PT DPT Acute Rehabilitation Services Secure Chat Preferred  Office [phone]   Jamoni Hewes E Christain [place] 11/06/2022, 1:52 PM

## 2022-11-06 NOTE — Evaluation (Signed)
Clinical/Bedside Swallow Evaluation Patient Details  Name: Nicole BUNNER MRN: [id number] Date of Birth: January 13, 1976  Today's Date: 11/06/2022 Time: SLP Start Time (ACUTE ONLY): 1027 SLP Stop Time (ACUTE ONLY): 1041 SLP Time Calculation (min) (ACUTE ONLY): 14 min  Past Medical History: History reviewed. No pertinent past medical history. Past Surgical History: History reviewed. No pertinent surgical history. HPI:  47 y.o. female admitted 6/18 after being found down and unresponsive. She was brought in by EMS and intubated in ED (ETT 6/18-23). Dx includes bilateral pneumonia right greater than left, severe dehydration and hypernatremia, AKI, and concern for possible anoxic injury. MRI head 6/18: "Symmetric signal abnormality within the globi pallidi is most  consistent with a toxic metabolic injury, such as carbon monoxide  poisoning. 2. 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  3. 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 Recidivated in drug use in the past few years.    Assessment / Plan / Recommendation  Clinical Impression  Ms. Flock presents with functional but weak swallowing after neuro dx and five-day oral intubation.  She demonstrated sluggish response on left to orofacial exam.  Voice weak/low volume.  Mild cough observed after initial trials of thin liquid, but this resolved after more oral intake. She demonstrated slow but functional mastication of crackers, adequate management of purees, and no further s/s of aspiration with thin liquids.  Recommend starting a dysphagia 3 diet with thin liquids; give meds whole in puree.  D/W RN, pt, and her mother. SLP will follow for safety/diet advancement. Pt will benefit from a cognitive/language assessment. SLP Visit Diagnosis: Dysphagia, unspecified (R13.10)           Diet Recommendation   Dysphagia 3,  thin liquids  Medication Administration: Whole meds with puree    Other  Recommendations Oral Care Recommendations: Oral care BID    Recommendations for follow up therapy are one component of a multi-disciplinary discharge planning process, led by the attending physician.  Recommendations may be updated based on patient status, additional functional criteria and insurance authorization.  Follow up Recommendations   tba         Functional Status Assessment Patient has had a recent decline in their functional status and demonstrates the ability to make significant improvements in function in a reasonable and predictable amount of time.  Frequency and Duration min 2x/week  2 weeks       Prognosis Prognosis for improved oropharyngeal function: Good      Swallow Study   General Date of Onset: 10/31/22 HPI: 47 y.o. female admitted 6/18 after being found down and unresponsive. She was brought in by EMS and intubated in ED (ETT 6/18-23). Dx includes bilateral pneumonia right greater than left, severe dehydration and hypernatremia, AKI, and concern for possible anoxic injury. MRI head 6/18: "Symmetric signal abnormality within the globi pallidi is most  consistent with a toxic metabolic injury, such as carbon monoxide  poisoning. 2. 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  3. 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 Type of Study: Bedside Swallow Evaluation Previous Swallow Assessment: no Diet Prior to this Study: NPO Temperature Spikes Noted: No Respiratory Status: Room air History of Recent Intubation:  Yes Total duration of intubation (days): 5 days Date extubated: 11/05/22 Behavior/Cognition: Alert;Cooperative;Pleasant mood Oral Cavity Assessment: Within  Functional Limits Oral Care Completed by SLP: Recent completion by staff Oral Cavity - Dentition: Adequate natural dentition Vision: Functional for self-feeding Self-Feeding Abilities: Needs assist Patient Positioning: Upright in bed Baseline Vocal Quality: Low vocal intensity Volitional Cough: Weak Volitional Swallow: Able to elicit    Oral/Motor/Sensory Function Overall Oral Motor/Sensory Function: Mild impairment Facial ROM: Reduced left;Suspected CN VII (facial) dysfunction (mildly flattened nasolabial fold) Facial Symmetry: Abnormal symmetry left;Suspected CN VII (facial) dysfunction Lingual Symmetry: Within Functional Limits   Ice Chips Ice chips: Within functional limits   Thin Liquid Thin Liquid: Within functional limits    Nectar Thick Nectar Thick Liquid: Not tested   Honey Thick Honey Thick Liquid: Not tested   Puree Puree: Within functional limits   Solid     Solid: Impaired Oral Phase Functional Implications: Prolonged oral transit      [name] 11/06/2022,11:09 AM  Marchelle Folks L. [name], MA CCC/SLP Clinical Specialist - Acute Care SLP Acute Rehabilitation Services Office number [phone]

## 2022-11-07 DIAGNOSIS — E870 Hyperosmolality and hypernatremia: Secondary | ICD-10-CM

## 2022-11-07 DIAGNOSIS — E860 Dehydration: Secondary | ICD-10-CM | POA: Diagnosis not present

## 2022-11-07 DIAGNOSIS — R4182 Altered mental status, unspecified: Secondary | ICD-10-CM | POA: Diagnosis not present

## 2022-11-07 DIAGNOSIS — J9601 Acute respiratory failure with hypoxia: Secondary | ICD-10-CM | POA: Diagnosis not present

## 2022-11-07 DIAGNOSIS — I6389 Other cerebral infarction: Secondary | ICD-10-CM | POA: Diagnosis not present

## 2022-11-07 LAB — BASIC METABOLIC PANEL
Anion gap: 7 (ref 5–15)
BUN: 14 mg/dL (ref 6–20)
CO2: 24 mmol/L (ref 22–32)
Calcium: 7.5 mg/dL — ABNORMAL LOW (ref 8.9–10.3)
Chloride: 103 mmol/L (ref 98–111)
Creatinine, Ser: 0.78 mg/dL (ref 0.44–1.00)
GFR, Estimated: >60 mL/min (ref >60)
Glucose, Bld: 136 mg/dL — ABNORMAL HIGH (ref 70–99)
Potassium: 3.6 mmol/L (ref 3.5–5.1)
Sodium: 134 mmol/L — ABNORMAL LOW (ref 135–145)

## 2022-11-07 LAB — HEPATIC FUNCTION PANEL
ALT: 90 U/L — ABNORMAL HIGH (ref 0–44)
AST: 67 U/L — ABNORMAL HIGH (ref 15–41)
Albumin: <1.5 g/dL — ABNORMAL LOW (ref 3.5–5.0)
Alkaline Phosphatase: 49 U/L (ref 38–126)
Bilirubin, Direct: 0.2 mg/dL (ref 0.0–0.2)
Indirect Bilirubin: 0.4 mg/dL (ref 0.3–0.9)
Total Bilirubin: 0.6 mg/dL (ref 0.3–1.2)
Total Protein: 6.6 g/dL (ref 6.5–8.1)

## 2022-11-07 LAB — HCV RT-PCR, QUANT (NON-GRAPH): Hepatitis C Quantitation: NOT DETECTED [IU]/mL

## 2022-11-07 LAB — GLUCOSE, CAPILLARY
Glucose-Capillary: 88 mg/dL (ref 70–99)
Glucose-Capillary: 95 mg/dL (ref 70–99)
Glucose-Capillary: 224 mg/dL — ABNORMAL HIGH (ref 70–99)
Glucose-Capillary: 140 mg/dL — ABNORMAL HIGH (ref 70–99)
Glucose-Capillary: 130 mg/dL — ABNORMAL HIGH (ref 70–99)
Glucose-Capillary: 99 mg/dL (ref 70–99)

## 2022-11-07 LAB — MAGNESIUM: Magnesium: 1.8 mg/dL (ref 1.7–2.4)

## 2022-11-07 LAB — HEPATITIS B SURFACE ANTIBODY, QUANTITATIVE: Hep B S AB Quant (Post): 143 m[IU]/mL (ref >9.9)

## 2022-11-07 LAB — PHOSPHORUS: Phosphorus: 4.1 mg/dL (ref 2.5–4.6)

## 2022-11-07 LAB — HCV AB W REFLEX TO QUANT PCR: HCV Ab: REACTIVE — AB

## 2022-11-07 MED ORDER — POTASSIUM CHLORIDE CRYS ER 20 MEQ PO TBCR
40.0000 meq | EXTENDED_RELEASE_TABLET | Freq: Once | ORAL | Status: AC
  Administered 2022-11-07: 40 meq via ORAL
  Filled 2022-11-07: qty 2

## 2022-11-07 MED ORDER — LINEZOLID 600 MG PO TABS
600.0000 mg | ORAL_TABLET | Freq: Two times a day (BID) | ORAL | Status: DC
  Administered 2022-11-07 – 2022-11-09 (×5): 600 mg via ORAL
  Filled 2022-11-07 (×7): qty 1

## 2022-11-07 MED ORDER — MAGNESIUM SULFATE 2 GM/50ML IV SOLN
2.0000 g | Freq: Once | INTRAVENOUS | Status: AC
  Administered 2022-11-07: 2 g via INTRAVENOUS
  Filled 2022-11-07: qty 50

## 2022-11-07 NOTE — Progress Notes (Signed)
eLink Physician-Brief Progress Note Patient Name: AJANI SCHNIEDERS DOB: [birth year] MRN: [id number]   Date of Service  11/07/2022  HPI/Events of Note  Notified of arrhythmia concerning for Vfib.  Strip reviewed and showing clear QRS complexes.  Pt back to sinus rhythm.  Suspect this was an artifact.   BP 100/63, HR 81, RR 19, O2 sats 100%  eICU Interventions  Continue to monitor.     Intervention Category Intermediate Interventions: Arrhythmia - evaluation and management  Larinda Buttery 11/07/2022, 1:11 AM

## 2022-11-07 NOTE — Progress Notes (Signed)
[facility]  Date of Admission:  10/31/2022   Total days of inpatient antibiotics 6  Principal Problem:   Acute respiratory failure (HCC) Active Problems:   Cavitary pneumonia   Substance use          Assessment: 47 year old female admitted with: #MRSA cavitary pneumonia pulmonary nodules #History of substance abuse #Leukocytosis-trending down #Altered mental status - Initial CT chest abdomen pelvis on 6/18 showed right greater than Consul consolidations, central lobar nodules, groundglass opacity consistent with aspiration pneumonia, concerning for cavitary infection in right lower lobe. - MRI brain consistent with toxic metabolic injury such as carbon monoxide poisoning, bilateral corona radiator deep watershed distribution infarcts and small amount of bifrontal SAH. Possibly related to cocaine use. UDS positive for cocaine.  Patient appears to be slow to respond, primary plans on consulting neurology -CT chest on 6/22 showed right lower lobe consolidative opacity with progression To comptroller implant measuring 3.2 cm compatible with cavitary pneumonia, patchy nodular left lower lobe airspace disease similar to prior with progression of confluent component compatible with pneumonia, 6 mm right middle lobe pulmonary nodule with recommended CT follow-up 6 to 12 months - TTE noted valves not seen well. Recommendations: -Continue linezolid. - Would recommend getting TEE given she has multifocal cavitary pneumonia with MRSA, bilateral watershed distribution infarcts on MRI brain. I am concerned about endocarditis.  I realize that blood cultures are negative but given her concern for septic emboli to lungs, brain will like valves further evaluated.  If there is concern for vegetation on TEE would opt for 6 weeks of IV therapy.  If no concern then we will do 2 weeks of linezolid followed by about 2-4 weeks of doxycycline for cavitary MRSA  pneumonia.   Microbiology:   Antibiotics: Vancomycin 6/18 Metronidazole 6/18 Cefepime 6/18-19 Doxycycline 6/19 - 6/22 Unasyn 6/19 - 6/24 Linezolid 6/22-present Cultures: Blood 6/18 no growth Urine  Other Respiratory culture, tracheal aspirate - MRSA  SUBJECTIVE: Back.,  Somewhat improved.  Mother at bedside. Interval: Afebrile overnight.  WBC 15 K.  Review of Systems: Review of Systems  All other systems reviewed and are negative.    Scheduled Meds:  Chlorhexidine Gluconate Cloth  6 each Topical Q0600   enoxaparin (LOVENOX) injection  40 mg Subcutaneous Q24H   feeding supplement  237 mL Oral BID BM   furosemide  40 mg Intravenous Daily   leptospermum manuka honey  1 Application Topical Daily   linezolid  600 mg Oral Q12H   multivitamin with minerals  1 tablet Oral Daily   sodium chloride flush  10-40 mL Intracatheter Q12H   thiamine  100 mg Oral Daily   Continuous Infusions:  sodium chloride Stopped (11/05/22 1633)   PRN Meds:.sodium chloride, acetaminophen, ibuprofen, metoCLOPramide (REGLAN) injection, mouth rinse, pentafluoroprop-tetrafluoroeth, polyethylene glycol, sodium chloride flush Not on File  OBJECTIVE: Vitals:   11/07/22 1111 11/07/22 1130 11/07/22 1200 11/07/22 1300  BP:  97/63 93/64 (!) 88/58  Pulse:  88 87   Resp:  18 (!) 22 (!) 21  Temp: 97.7 F (36.5 C)     TempSrc: Oral     SpO2:  97% 98% 96%  Weight:      Height:       Body mass index is 21.08 kg/m.  Physical Exam Constitutional:      Appearance: Normal appearance.  HENT:     Head: Normocephalic and atraumatic.     Right Ear: Tympanic membrane normal.  Left Ear: Tympanic membrane normal.     Nose: Nose normal.     Mouth/Throat:     Mouth: Mucous membranes are moist.  Eyes:     Extraocular Movements: Extraocular movements intact.     Conjunctiva/sclera: Conjunctivae normal.     Pupils: Pupils are equal, round, and reactive to light.  Cardiovascular:     Rate and Rhythm:  Normal rate and regular rhythm.     Heart sounds: No murmur heard.    No friction rub. No gallop.  Pulmonary:     Effort: Pulmonary effort is normal.     Breath sounds: Normal breath sounds.  Abdominal:     General: Abdomen is flat.     Palpations: Abdomen is soft.  Musculoskeletal:        General: Normal range of motion.  Skin:    General: Skin is warm and dry.  Neurological:     General: No focal deficit present.     Mental Status: She is alert and oriented to person, place, and time.  Psychiatric:        Mood and Affect: Mood normal.       Lab Results Lab Results  Component Value Date   WBC 15.2 (H) 11/06/2022   HGB 11.0 (L) 11/06/2022   HCT 33.5 (L) 11/06/2022   MCV 99.7 11/06/2022   PLT 175 11/06/2022    Lab Results  Component Value Date   CREATININE 0.78 11/07/2022   BUN 14 11/07/2022   NA 134 (L) 11/07/2022   K 3.6 11/07/2022   CL 103 11/07/2022   CO2 24 11/07/2022    Lab Results  Component Value Date   ALT 90 (H) 11/07/2022   AST 67 (H) 11/07/2022   ALKPHOS 49 11/07/2022   BILITOT 0.6 11/07/2022        Danelle Earthly, MD [facility] 11/07/2022, 1:10 PM  I have personally spent 55 minutes involved in face-to-face and non-face-to-face activities for this patient on the day of the visit. Professional time spent includes the following activities: Preparing to see the patient (review of tests), Obtaining and/or reviewing separately obtained history (admission/discharge record), Performing a medically appropriate examination and/or evaluation , Ordering medications/tests/procedures, referring and communicating with other health care professionals, Documenting clinical information in the EMR, Independently interpreting results (not separately reported), Communicating results to the patient/family/caregiver, Counseling and educating the patient/family/caregiver and Care coordination (not separately reported).

## 2022-11-07 NOTE — Progress Notes (Signed)
BP: (!) 100/56 99/60 100/65 97/66  Pulse: 71 90 79 83  Resp: (!) 26 19 19  (!) 24  Temp:      TempSrc:      SpO2: 99% 100% 99% 100%  Weight:      Height:        Intake/Output Summary (Last 24 hours) at 11/07/2022 1017 Last data filed at 11/07/2022 0800 Gross per 24 hour  Intake 1372.57 ml  Output 2000 ml  Net -627.43 ml     Wt Readings from Last 3 Encounters:  11/06/22 50.6 kg     Exam General: Alert and oriented to self, person, time, slow to respond, frail and ill-appearing Cardiovascular: S1 S2 auscultated,  RRR Respiratory: dec BS at the bases, R>L, rhonchi Gastrointestinal: Soft, nontender, nondistended, + bowel sounds Ext: anasarca Neuro: mumbling speech pattern, slow to respond but oriented, not able to lift up both LE. Moved LUE some but not grip, RUE 4+/5 Psych: very slow to respond   Data Reviewed:  I have personally reviewed following labs    CBC Lab Results  Component Value Date   WBC 15.2 (H) 11/06/2022   RBC 3.36 (L) 11/06/2022   HGB 11.0 (L) 11/06/2022   HCT 33.5 (L) 11/06/2022   MCV 99.7 11/06/2022   MCH 32.7 11/06/2022   PLT 175 11/06/2022   MCHC 32.8 11/06/2022   RDW 12.3 11/06/2022   LYMPHSABS 1.8 11/06/2022   MONOABS 0.9 11/06/2022   EOSABS 0.0 11/06/2022   BASOSABS 0.0 11/06/2022     Last metabolic panel Lab Results  Component Value Date   NA 134 (L) 11/07/2022   K 3.6 11/07/2022   CL 103 11/07/2022   CO2 24 11/07/2022   BUN 14 11/07/2022   CREATININE 0.78 11/07/2022   GLUCOSE 136 (H) 11/07/2022   GFRNONAA >60 11/07/2022   CALCIUM 7.5 (L) 11/07/2022   PHOS 4.1 11/07/2022   PROT 6.6 11/07/2022   ALBUMIN <1.5 (L) 11/07/2022   BILITOT 0.6 11/07/2022   ALKPHOS 49 11/07/2022   AST 67 (H) 11/07/2022   ALT 90 (H) 11/07/2022   ANIONGAP 7 11/07/2022    CBG (last  3)  Recent Labs    11/06/22 2308 11/07/22 0315 11/07/22 0720  GLUCAP 167* 88 95      Coagulation Profile: Recent Labs  Lab 10/31/22 1500  INR 1.6*     Radiology Studies: I have personally reviewed the imaging studies  ECHOCARDIOGRAM COMPLETE  Result Date: 11/06/2022    ECHOCARDIOGRAM REPORT   Patient Name:   Nicole [name] Date of Exam: 11/06/2022 Medical Rec #:  [id number]   Height:       61.0 in Accession #:    [id number]  Weight:       111.6 lb Date of Birth:  [birth year]   BSA:          1.474 m Patient Age:    47 years    BP:           90/53 mmHg Patient Gender: F           HR:           70 bpm. Exam Location:  Inpatient Procedure: 2D Echo, Pediatric Echo, Color Doppler and Intracardiac Opacification            Agent Indications:    Dyspnea R06.00  History:        Patient has no prior history of Echocardiogram examinations.  [facility]                                                                              Reserve, is a 47 y.o. female, DOB - February 28, 1976, XBJ:[id number] Admit date - 10/31/2022    Outpatient Primary MD for the patient is Nicole Skeans, MD  LOS - 7  days  Chief Complaint  Patient presents with   unresponsive       Brief summary   Patient is a 47 year old female with polysubstance abuse, was found unresponsive by her by bystander next-door condemned building.  EMS was notified.  She was hypoxic on EMS arrival, requiring 15 L NRB.  In ED, she was unresponsive, tachypneic, diaphoretic and critically ill, WBCs 43.6, serum osmolality 379, sodium 164, K3.1, creatinine 2.5.  She was intubated for airway protection and hypoxic respiratory failure. Chest x-ray showed right basilar airspace disease.  CT head showed symmetric edema within bilateral globus palladi concerning for ischemic/hypoxic injury. CT chest abdomen pelvis showed right greater than left lower lobe consolidation, nodular groundglass opacities with lucency in the right lower lobe concerning for cavitary infection. Patient was admitted to ICU, started on IV vancomycin and cefepime.  Of note, mother reported no history of major medical illnesses. 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   Significant Hospital Events:  6/18 admitted after being found unresponsive and hypoxic with bilateral pneumonia right greater than left, severe dehydration and hypernatremia, AKI, and concern for possible anoxic injury intubated by EDP antibiotics initiated.  She was unresponsive postintubation and during intubation process 6/19 MRI shows bilateral globus pallidus injury 6/22 sedation now following commands predominantly on the right side. 6/23: Extubated 6/25: Transferred to telemetry level,  [facility] assumed care   Assessment & Plan    Principal Problem:   Acute respiratory failure (HCC) with hypoxia, bilateral pneumonia, cavitary RLL  PNA likely aspiration PNA, MRSA pneumonia  -Extubated on 6/23, currently stable on room air -Respiratory cultures positive for MRSA, blood cultures negative so far -ID following, Unasyn DC'd on 6/24, continue linezolid -2D echo showed no valvular vegetation. -CT chest 6/22 showed interval progression of RUL LLL consolidative opacity with progression of cavitary component now measuring 3.2 cm with layering fluid level compatible with cavitary pneumonia, patchy nodular left lower lobe airspace disease. 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  -Patient was seen by neurosurgery on 6/18, no acute neurosurgical intervention recommended, supportive care, frequent exams, poor prognosis -Slow to respond, oriented, follows commands, bilateral lower extremity weakness, LUE weakness, will  [facility]                                                                              Reserve, is a 47 y.o. female, DOB - February 28, 1976, XBJ:[id number] Admit date - 10/31/2022    Outpatient Primary MD for the patient is Nicole Skeans, MD  LOS - 7  days  Chief Complaint  Patient presents with   unresponsive       Brief summary   Patient is a 47 year old female with polysubstance abuse, was found unresponsive by her by bystander next-door condemned building.  EMS was notified.  She was hypoxic on EMS arrival, requiring 15 L NRB.  In ED, she was unresponsive, tachypneic, diaphoretic and critically ill, WBCs 43.6, serum osmolality 379, sodium 164, K3.1, creatinine 2.5.  She was intubated for airway protection and hypoxic respiratory failure. Chest x-ray showed right basilar airspace disease.  CT head showed symmetric edema within bilateral globus palladi concerning for ischemic/hypoxic injury. CT chest abdomen pelvis showed right greater than left lower lobe consolidation, nodular groundglass opacities with lucency in the right lower lobe concerning for cavitary infection. Patient was admitted to ICU, started on IV vancomycin and cefepime.  Of note, mother reported no history of major medical illnesses. 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   Significant Hospital Events:  6/18 admitted after being found unresponsive and hypoxic with bilateral pneumonia right greater than left, severe dehydration and hypernatremia, AKI, and concern for possible anoxic injury intubated by EDP antibiotics initiated.  She was unresponsive postintubation and during intubation process 6/19 MRI shows bilateral globus pallidus injury 6/22 sedation now following commands predominantly on the right side. 6/23: Extubated 6/25: Transferred to telemetry level,  [facility] assumed care   Assessment & Plan    Principal Problem:   Acute respiratory failure (HCC) with hypoxia, bilateral pneumonia, cavitary RLL  PNA likely aspiration PNA, MRSA pneumonia  -Extubated on 6/23, currently stable on room air -Respiratory cultures positive for MRSA, blood cultures negative so far -ID following, Unasyn DC'd on 6/24, continue linezolid -2D echo showed no valvular vegetation. -CT chest 6/22 showed interval progression of RUL LLL consolidative opacity with progression of cavitary component now measuring 3.2 cm with layering fluid level compatible with cavitary pneumonia, patchy nodular left lower lobe airspace disease. 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  -Patient was seen by neurosurgery on 6/18, no acute neurosurgical intervention recommended, supportive care, frequent exams, poor prognosis -Slow to respond, oriented, follows commands, bilateral lower extremity weakness, LUE weakness, will  [facility]                                                                              Reserve, is a 47 y.o. female, DOB - February 28, 1976, XBJ:[id number] Admit date - 10/31/2022    Outpatient Primary MD for the patient is Nicole Skeans, MD  LOS - 7  days  Chief Complaint  Patient presents with   unresponsive       Brief summary   Patient is a 47 year old female with polysubstance abuse, was found unresponsive by her by bystander next-door condemned building.  EMS was notified.  She was hypoxic on EMS arrival, requiring 15 L NRB.  In ED, she was unresponsive, tachypneic, diaphoretic and critically ill, WBCs 43.6, serum osmolality 379, sodium 164, K3.1, creatinine 2.5.  She was intubated for airway protection and hypoxic respiratory failure. Chest x-ray showed right basilar airspace disease.  CT head showed symmetric edema within bilateral globus palladi concerning for ischemic/hypoxic injury. CT chest abdomen pelvis showed right greater than left lower lobe consolidation, nodular groundglass opacities with lucency in the right lower lobe concerning for cavitary infection. Patient was admitted to ICU, started on IV vancomycin and cefepime.  Of note, mother reported no history of major medical illnesses. 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   Significant Hospital Events:  6/18 admitted after being found unresponsive and hypoxic with bilateral pneumonia right greater than left, severe dehydration and hypernatremia, AKI, and concern for possible anoxic injury intubated by EDP antibiotics initiated.  She was unresponsive postintubation and during intubation process 6/19 MRI shows bilateral globus pallidus injury 6/22 sedation now following commands predominantly on the right side. 6/23: Extubated 6/25: Transferred to telemetry level,  [facility] assumed care   Assessment & Plan    Principal Problem:   Acute respiratory failure (HCC) with hypoxia, bilateral pneumonia, cavitary RLL  PNA likely aspiration PNA, MRSA pneumonia  -Extubated on 6/23, currently stable on room air -Respiratory cultures positive for MRSA, blood cultures negative so far -ID following, Unasyn DC'd on 6/24, continue linezolid -2D echo showed no valvular vegetation. -CT chest 6/22 showed interval progression of RUL LLL consolidative opacity with progression of cavitary component now measuring 3.2 cm with layering fluid level compatible with cavitary pneumonia, patchy nodular left lower lobe airspace disease. 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  -Patient was seen by neurosurgery on 6/18, no acute neurosurgical intervention recommended, supportive care, frequent exams, poor prognosis -Slow to respond, oriented, follows commands, bilateral lower extremity weakness, LUE weakness, will  BP: (!) 100/56 99/60 100/65 97/66  Pulse: 71 90 79 83  Resp: (!) 26 19 19  (!) 24  Temp:      TempSrc:      SpO2: 99% 100% 99% 100%  Weight:      Height:        Intake/Output Summary (Last 24 hours) at 11/07/2022 1017 Last data filed at 11/07/2022 0800 Gross per 24 hour  Intake 1372.57 ml  Output 2000 ml  Net -627.43 ml     Wt Readings from Last 3 Encounters:  11/06/22 50.6 kg     Exam General: Alert and oriented to self, person, time, slow to respond, frail and ill-appearing Cardiovascular: S1 S2 auscultated,  RRR Respiratory: dec BS at the bases, R>L, rhonchi Gastrointestinal: Soft, nontender, nondistended, + bowel sounds Ext: anasarca Neuro: mumbling speech pattern, slow to respond but oriented, not able to lift up both LE. Moved LUE some but not grip, RUE 4+/5 Psych: very slow to respond   Data Reviewed:  I have personally reviewed following labs    CBC Lab Results  Component Value Date   WBC 15.2 (H) 11/06/2022   RBC 3.36 (L) 11/06/2022   HGB 11.0 (L) 11/06/2022   HCT 33.5 (L) 11/06/2022   MCV 99.7 11/06/2022   MCH 32.7 11/06/2022   PLT 175 11/06/2022   MCHC 32.8 11/06/2022   RDW 12.3 11/06/2022   LYMPHSABS 1.8 11/06/2022   MONOABS 0.9 11/06/2022   EOSABS 0.0 11/06/2022   BASOSABS 0.0 11/06/2022     Last metabolic panel Lab Results  Component Value Date   NA 134 (L) 11/07/2022   K 3.6 11/07/2022   CL 103 11/07/2022   CO2 24 11/07/2022   BUN 14 11/07/2022   CREATININE 0.78 11/07/2022   GLUCOSE 136 (H) 11/07/2022   GFRNONAA >60 11/07/2022   CALCIUM 7.5 (L) 11/07/2022   PHOS 4.1 11/07/2022   PROT 6.6 11/07/2022   ALBUMIN <1.5 (L) 11/07/2022   BILITOT 0.6 11/07/2022   ALKPHOS 49 11/07/2022   AST 67 (H) 11/07/2022   ALT 90 (H) 11/07/2022   ANIONGAP 7 11/07/2022    CBG (last  3)  Recent Labs    11/06/22 2308 11/07/22 0315 11/07/22 0720  GLUCAP 167* 88 95      Coagulation Profile: Recent Labs  Lab 10/31/22 1500  INR 1.6*     Radiology Studies: I have personally reviewed the imaging studies  ECHOCARDIOGRAM COMPLETE  Result Date: 11/06/2022    ECHOCARDIOGRAM REPORT   Patient Name:   Nicole [name] Date of Exam: 11/06/2022 Medical Rec #:  [id number]   Height:       61.0 in Accession #:    [id number]  Weight:       111.6 lb Date of Birth:  [birth year]   BSA:          1.474 m Patient Age:    47 years    BP:           90/53 mmHg Patient Gender: F           HR:           70 bpm. Exam Location:  Inpatient Procedure: 2D Echo, Pediatric Echo, Color Doppler and Intracardiac Opacification            Agent Indications:    Dyspnea R06.00  History:        Patient has no prior history of Echocardiogram examinations.  BP: (!) 100/56 99/60 100/65 97/66  Pulse: 71 90 79 83  Resp: (!) 26 19 19  (!) 24  Temp:      TempSrc:      SpO2: 99% 100% 99% 100%  Weight:      Height:        Intake/Output Summary (Last 24 hours) at 11/07/2022 1017 Last data filed at 11/07/2022 0800 Gross per 24 hour  Intake 1372.57 ml  Output 2000 ml  Net -627.43 ml     Wt Readings from Last 3 Encounters:  11/06/22 50.6 kg     Exam General: Alert and oriented to self, person, time, slow to respond, frail and ill-appearing Cardiovascular: S1 S2 auscultated,  RRR Respiratory: dec BS at the bases, R>L, rhonchi Gastrointestinal: Soft, nontender, nondistended, + bowel sounds Ext: anasarca Neuro: mumbling speech pattern, slow to respond but oriented, not able to lift up both LE. Moved LUE some but not grip, RUE 4+/5 Psych: very slow to respond   Data Reviewed:  I have personally reviewed following labs    CBC Lab Results  Component Value Date   WBC 15.2 (H) 11/06/2022   RBC 3.36 (L) 11/06/2022   HGB 11.0 (L) 11/06/2022   HCT 33.5 (L) 11/06/2022   MCV 99.7 11/06/2022   MCH 32.7 11/06/2022   PLT 175 11/06/2022   MCHC 32.8 11/06/2022   RDW 12.3 11/06/2022   LYMPHSABS 1.8 11/06/2022   MONOABS 0.9 11/06/2022   EOSABS 0.0 11/06/2022   BASOSABS 0.0 11/06/2022     Last metabolic panel Lab Results  Component Value Date   NA 134 (L) 11/07/2022   K 3.6 11/07/2022   CL 103 11/07/2022   CO2 24 11/07/2022   BUN 14 11/07/2022   CREATININE 0.78 11/07/2022   GLUCOSE 136 (H) 11/07/2022   GFRNONAA >60 11/07/2022   CALCIUM 7.5 (L) 11/07/2022   PHOS 4.1 11/07/2022   PROT 6.6 11/07/2022   ALBUMIN <1.5 (L) 11/07/2022   BILITOT 0.6 11/07/2022   ALKPHOS 49 11/07/2022   AST 67 (H) 11/07/2022   ALT 90 (H) 11/07/2022   ANIONGAP 7 11/07/2022    CBG (last  3)  Recent Labs    11/06/22 2308 11/07/22 0315 11/07/22 0720  GLUCAP 167* 88 95      Coagulation Profile: Recent Labs  Lab 10/31/22 1500  INR 1.6*     Radiology Studies: I have personally reviewed the imaging studies  ECHOCARDIOGRAM COMPLETE  Result Date: 11/06/2022    ECHOCARDIOGRAM REPORT   Patient Name:   Nicole [name] Date of Exam: 11/06/2022 Medical Rec #:  [id number]   Height:       61.0 in Accession #:    [id number]  Weight:       111.6 lb Date of Birth:  [birth year]   BSA:          1.474 m Patient Age:    47 years    BP:           90/53 mmHg Patient Gender: F           HR:           70 bpm. Exam Location:  Inpatient Procedure: 2D Echo, Pediatric Echo, Color Doppler and Intracardiac Opacification            Agent Indications:    Dyspnea R06.00  History:        Patient has no prior history of Echocardiogram examinations.

## 2022-11-07 NOTE — Progress Notes (Signed)
Physical Therapy Treatment Patient Details Name: Nicole [name] MRN: [id number] DOB: 04/12/1976 Today's Date: 11/07/2022   History of Present Illness 47 y.o. female admitted 6/18 after being found down and unresponsive. She was brought in by EMS and intubated in ED (ETT 6/18-23). Dx includes bilateral pneumonia right greater than left, severe dehydration and hypernatremia, AKI, and concern for possible anoxic injury. MRI head 6/18: "Symmetric signal abnormality within the globi pallidi is most  consistent with a toxic metabolic injury, such as carbon monoxide  poisoning. 2. 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  3. Small amount of bifrontal SAH". PMH includes remote history of drug abuse.    PT Comments    Pt verbalizes she is frustrated, cannot state why when PT asks follow up questions. Pt following commands intermittently during session, requires significant multimodal cuing throughout. Pt overall requiring mod-max +2 assist for bed mobility and transfer into stand x2, pt limited in standing tolerance due to weakness and bout of coughing (VSS). Plan remains appropriate.     Recommendations for follow up therapy are one component of a multi-disciplinary discharge planning process, led by the attending physician.  Recommendations may be updated based on patient status, additional functional criteria and insurance authorization.  Follow Up Recommendations       Assistance Recommended at Discharge Frequent or constant Supervision/Assistance  Patient can return home with the following Two people to help with walking and/or transfers;Two people to help with bathing/dressing/bathroom   Equipment Recommendations  None recommended by PT    Recommendations for Other Services       Precautions / Restrictions Precautions Precautions: Fall Restrictions Weight Bearing Restrictions: No     Mobility  Bed Mobility Overal bed mobility: Needs Assistance Bed Mobility:  Supine to Sit, Sit to Supine     Supine to sit: Max assist Sit to supine: Max assist, +2 for physical assistance   General bed mobility comments: for trunk and LE management, scooting to and from EOB.    Transfers Overall transfer level: Needs assistance Equipment used: 2 person hand held assist   Sit to Stand: Mod assist, +2 physical assistance           General transfer comment: assist for power up, rise, steadying. stand x2 from EOB, second attempt x30 second tolerance before needing to sit.    Ambulation/Gait                   Stairs             Wheelchair Mobility    Modified Rankin (Stroke Patients Only) Modified Rankin (Stroke Patients Only) Pre-Morbid Rankin Score: No symptoms Modified Rankin: Severe disability     Balance Overall balance assessment: Needs assistance Sitting-balance support: Bilateral upper extremity supported, Feet supported Sitting balance-Leahy Scale: Poor Sitting balance - Comments: periods of min posterior assist only, but usually requiring moderate truncal assist to maintain upright Postural control: Posterior lean Standing balance support: Bilateral upper extremity supported Standing balance-Leahy Scale: Poor Standing balance comment: mod +2 assist                            Cognition Arousal/Alertness: Lethargic Behavior During Therapy: Flat affect Overall Cognitive Status: Impaired/Different from baseline Area of Impairment: Orientation, Attention, Following commands, Safety/judgement, Problem solving                 Orientation Level: Person, Time, Situation, Disoriented to (oriented to month of birthday only, states  year is "43, no 2023") Current Attention Level: Focused   Following Commands: Follows one step commands with increased time (with repeated cuing) Safety/Judgement: Decreased awareness of safety, Decreased awareness of deficits   Problem Solving: Slow processing, Decreased  initiation, Difficulty sequencing, Requires verbal cues, Requires tactile cues (max, repeated cues)          Exercises      General Comments        Pertinent Vitals/Pain Pain Assessment Pain Assessment: Faces Faces Pain Scale: Hurts even more Pain Location: generalized with mobility Pain Descriptors / Indicators: Guarding, Grimacing Pain Intervention(s): Limited activity within patient's tolerance, Monitored during session, Repositioned    Home Living                          Prior Function            PT Goals (current goals can now be found in the care plan section) Acute Rehab PT Goals Patient Stated Goal: per mother, return to PLOF PT Goal Formulation: With patient/family Time For Goal Achievement: 11/20/22 Potential to Achieve Goals: Fair Progress towards PT goals: Progressing toward goals    Frequency    Min 4X/week      PT Plan Current plan remains appropriate    Co-evaluation              AM-PAC PT "6 Clicks" Mobility   Outcome Measure  Help needed turning from your back to your side while in a flat bed without using bedrails?: A Lot Help needed moving from lying on your back to sitting on the side of a flat bed without using bedrails?: A Lot Help needed moving to and from a bed to a chair (including a wheelchair)?: Total Help needed standing up from a chair using your arms (e.g., wheelchair or bedside chair)?: Total Help needed to walk in hospital room?: Total Help needed climbing 3-5 steps with a railing? : Total 6 Click Score: 8    End of Session   Activity Tolerance: Patient limited by fatigue;Patient limited by lethargy Patient left: in bed;with call bell/phone within reach;with bed alarm set;with nursing/sitter in room;with SCD's reapplied;with family/visitor present Nurse Communication: Mobility status PT Visit Diagnosis: Other abnormalities of gait and mobility (R26.89);Muscle weakness (generalized) (M62.81);Hemiplegia and  hemiparesis Hemiplegia - Right/Left: Left Hemiplegia - dominant/non-dominant: Non-dominant Hemiplegia - caused by: Unspecified;Cerebral infarction     Time: 5621-3086 PT Time Calculation (min) (ACUTE ONLY): 18 min  Charges:  $Therapeutic Activity: 8-22 mins                     Marye Round, PT DPT Acute Rehabilitation Services Secure Chat Preferred  Office [phone]    Otho Michalik Sheliah Plane 11/07/2022, 2:45 PM

## 2022-11-07 NOTE — Hospital Course (Addendum)
Nicole [name] is a 47 year old F with a history of remote polysubstance use who was found unresponsive by a bystander. It is unclear how long she was done. She was noted to be hypoxemic with multiple metabolic derangements.   She was intubated and started on IV antibiotics. She was noted to have cavitary PNA, severe dehydration, and AKI.   MRI was obtained which showed globus pallidus injury and infarcts in a watershed distribution and small amount of bifrontal SAH.   Neurology was consulted due to Nicole [name] neurological deficits.    Patient intermittently follows commands. Volume of voice changes.   Would not lift extremities. RUE moves spontaneously.    Eyes pupil 4mm reactive to light bilatearlly.   Exam difficult due to intermittent follows commands. Withdraws to pain in all four extremities.    Suspect patient's deficits are secondary to Nicole [name] anoxic brain injury and toxic insults from Nicole [name] cocaine use.   No further interventions at this time. Watchfull waiting, PT/OT,   BP 93/64.   Severe malnutrition  (albumin 2.6 on admission, <1.5) Mild elevated transaminases.

## 2022-11-07 NOTE — Progress Notes (Signed)
[facility] ADULT ICU REPLACEMENT PROTOCOL   The patient does apply for the [facility] Adult ICU Electrolyte Replacment Protocol based on the criteria listed below:   1.Exclusion criteria: TCTS, ECMO, Dialysis, and Myasthenia Gravis patients 2. Is GFR >/= 30 ml/min? Yes.    Patient's GFR today is >60 3. Is SCr </= 2? Yes.   Patient's SCr is 0.78 mg/dL 4. Did SCr increase >/= 0.5 in 24 hours? No. 5.[name] weight >40kg  Yes.   6. Abnormal electrolyte(s): K+ 3.6, mag 1.8  7. Electrolytes replaced per protocol 8.  Call MD STAT for K+ </= 2.5, Phos </= 1, or Mag </= 1 Physician:  Dr.Yap  Lolita Lenz 11/07/2022 4:37 AM

## 2022-11-07 NOTE — Consult Note (Addendum)
NEUROLOGY CONSULTATION NOTE   Date of service: November 07, 2022 Patient Name: Nicole [name] MRN:  [id number] DOB:  February 06, 1976 Reason for consult: "weakness" _ _ _   _ __   _ __ _ _  __ __   _ __   __ _  History of Present Illness  Nicole [name] is a 47 y.o. female with remote history of polysubstance use who presented after being found down and unresponsive by a bystander.    It is unclear how long she was down. She does admit to using cocaine and her UDS was positive for cocaine. She was noted to be hypoxemic with multiple metabolic derangements on initial presentation.   She was intubated and started on IV antibiotics for a cavitary pna.   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   With respect to her neurological status she has been able to follow commands per EHR mostly on the R side. Per patient's mother she has been able to use utensils as well occasionally.   Neurology was consulted due to her persistent neurological deficits.    ROS  Unable to assess patient intermittently following commands  Past History  History reviewed. No pertinent past medical history. History reviewed. No pertinent surgical history. History reviewed. No pertinent family history. Social History   Socioeconomic History   Marital status: Single    Spouse name: Not on file   Number of children: Not on file   Years of education: Not on file   Highest education level: Not on file  Occupational History   Not on file  Tobacco Use   Smoking status: Not on file   Smokeless tobacco: Not on file  Substance and Sexual Activity   Alcohol use: Not on file   Drug use: Not on file   Sexual activity: Not on file  Other Topics Concern   Not on file  Social History Narrative   Not on file   Social Determinants of Health   Financial Resource Strain:  Not on file  Food Insecurity: Not on file  Transportation Needs: Not on file  Physical Activity: Not on file  Stress: Not on file  Social Connections: Not on file   Not on File  Medications   No medications prior to admission.     Vitals   Vitals:   11/07/22 1111 11/07/22 1130 11/07/22 1200 11/07/22 1300  BP:  97/63 93/64 (!) 88/58  Pulse:  88 87   Resp:  18 (!) 22 (!) 21  Temp: 97.7 F (36.5 C)     TempSrc: Oral     SpO2:  97% 98% 96%  Weight:      Height:         Body mass index is 21.08 kg/m.  Physical Exam   General: Malnourished, lying in bed comfortably; in no acute distress.  CV: RRR. No peripheral edema.  Pulmonary: Symmetric Chest rise. Normal respiratory effort. On RA    Neurologic Examination  Mental status/Cognition: Alert, oriented to self, place, time, poor attention.  She does appear to have some left-sided neglect Speech/language: Fluent, intermittently converses, volume of voice is low.  Cranial nerves:   CN II Pupils 4-17mm bilaterally, reactive to light    CN III,IV,VI Unable to assess patient intermittently following commands   CN V She responds to stimulation on both sides of the  face   CN VII Mild L facial droop    CN VIII normal hearing to speech    CN IX & X Cough is present   CN XI    CN XII midline tongue protrusion    Motor:  Muscle bulk: decreased, tone normal  Patient was able to squeeze hands very minimally bilaterally, she has some proximal movement on the right arm, but severely weak 1-2/5, on the left she has very little proximal movement of either the arm or leg, she does have some movement of the right leg, but again 1-2/5  Mvmt Root Nerve  Muscle Right Left Comments  SA C5/6 Ax Deltoid 3 1   EF C5/6 Mc Biceps 3 1   EE C6/7/8 Rad Triceps     WF C6/7 Med FCR     WE C7/8 PIN ECU     F Ab C8/T1 U ADM/FDI     HF L1/2/3 Fem Illopsoas     KE L2/3/4 Fem Quad 2 1   DF L4/5 D Peron Tib Ant 2 1   PF S1/2 Tibial Grc/Sol 2 1     Reflexes:  Right Left Comments  Pectoralis      Biceps (C5/6)     Brachioradialis (C5/6) 2 2    Triceps (C6/7)      Patellar (L3/4) 2 2    Achilles (S1) 2 2    Hoffman      Plantar     Jaw jerk    Sensation:  Light touch Intact bilaterally, though she endorses symmetric sensation she extinguishes to double simultaneous stimulation   Pin prick    Temperature    Vibration   Proprioception    Coordination/Complex Motor:  Patient is unable to perform  Labs   CBC:  Recent Labs  Lab 11/05/22 0430 11/06/22 0540  WBC 23.0* 15.2*  NEUTROABS 19.6* 12.4*  HGB 11.9* 11.0*  HCT 35.9* 33.5*  MCV 97.0 99.7  PLT 158 175    Basic Metabolic Panel:  Lab Results  Component Value Date   NA 134 (L) 11/07/2022   K 3.6 11/07/2022   CO2 24 11/07/2022   GLUCOSE 136 (H) 11/07/2022   BUN 14 11/07/2022   CREATININE 0.78 11/07/2022   CALCIUM 7.5 (L) 11/07/2022   GFRNONAA >60 11/07/2022   Lipid Panel: No results found for: "LDLCALC" HgbA1c:  Lab Results  Component Value Date   HGBA1C 5.1 10/31/2022   Urine Drug Screen:     Component Value Date/Time   LABOPIA NONE DETECTED 10/31/2022 1618   COCAINSCRNUR POSITIVE (A) 10/31/2022 1618   LABBENZ NONE DETECTED 10/31/2022 1618   AMPHETMU NONE DETECTED 10/31/2022 1618   THCU NONE DETECTED 10/31/2022 1618   LABBARB NONE DETECTED 10/31/2022 1618    Alcohol Level     Component Value Date/Time   ETH <10 10/31/2022 1835    CT Head without contrast: (10/31/2022) IMPRESSION: 1. Abnormal symmetric edema within bilateral globus palladi concerning for acute hypoxic/ischemic or metabolic/toxic injury. Recommend MRI head to further assess. 2. Trace/equivocal hyperdensity along the high bilateral frontal sulci could be artifactual or trace subarachnoid hemorrhage. No mass effect. Recommend attention on the forthcoming MRI. If MRI is not obtained, recommend follow-up CT to ensure stability. 3. Rotation of C1 on C2, most likely  positional in the absence of a fixed torticollis. 4. No evidence of acute fracture in the cervical spine.   MRI Brain  IMPRESSION: 1. Symmetric signal abnormality within the globi pallidi is most consistent with a  toxic metabolic injury, such as carbon monoxide poisoning. 2. 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 3. Small amount of bifrontal subarachnoid hemorrhage, unchanged.   Impression   Nicole [name] is a 47 year old F with a history of remote polysubstance use who was found unresponsive by a bystander who was admitted to the ICU due to her hypoxemia and AMS requiring intubation, found to have cavitary pna as well as acute infarcts in the CR (watershed distribution) and bilateral global pallidi injury.   She has had improvement in her neurological status since admission but continues to be intermittently confused and on exam has some weakness L>R. Suspect that patient's symptoms are a result of a toxic insult from her cocaine (possibly fentanyl) use as well as hypoperfusion in the setting of her hypotension. Her recovery is unpredictable, but suspect it will be delayed and prolonged. Regarding her long term deficits, this will be more clear when she is months out from her acute event.   Recommendations  Goal normotensive  No need for antiplatelets from a neurological perspective Continue PT/OT/SLP ______________________________________________________________________  Marolyn Haller, MD PGY-3 Internal Medicine Resident   I have seen the patient and reviewed the above note.  Her bilateral globus pallidus injury is described with cocaine, but can also occur in the setting of fentanyl use(on the spectrum of chanters syndrome), but this does not explain her bilateral watershed insults.  These are typical for hypoperfusion episode, and I suspect that she was hypoperfusing for quite some time.  I do not think other stroke workup needs to be performed given the  etiology is relatively clear, and secondary prevention which consists of avoiding cocaine and hypotension.  My suspicion is that she will need extensive rehabilitation, with a likely incomplete recovery, though her ultimate potential for recovery will only be known with time.  Neurology will be available on an as-needed basis, please call with further questions or concerns.  Thank you for the opportunity to take part in the care of this patient. If you have any further questions, please contact the neurology consultation attending. Neurology will sign off at this time.   Signed,  Ritta Slot, MD [facility] [phone]  If 7pm- 7am, please page neurology on call as listed in AMION.

## 2022-11-07 NOTE — Progress Notes (Signed)
Speech Language Pathology Treatment: Dysphagia  Patient Details Name: Nicole [name] MRN: [id number] DOB: 12/12/1975 Today's Date: 11/07/2022 Time: 4098-1191 SLP Time Calculation (min) (ACUTE ONLY): 10 min  Assessment / Plan / Recommendation Clinical Impression  Pt is doing well with PO diet. Good appetite at dinner and bfast per RN. Able to self-feed with help.  Today she demonstrated mild anterior spillage with solids, required min verbal/tactile cues to attend to face/lips. There were no s/s of aspiration when drinking consecutive sips of thin liquid.  She was talking intermittently with some repetitions and word-finding difficulties. Orders for speech/language evaluation pending.  SLP will follow. D/W pt's mother, who was at bedside.   HPI HPI: 47 y.o. female admitted 6/18 after being found down and unresponsive. She was brought in by EMS and intubated in ED (ETT 6/18-23). Dx includes bilateral pneumonia right greater than left, severe dehydration and hypernatremia, AKI, and concern for possible anoxic injury. MRI head 6/18: "Symmetric signal abnormality within the globi pallidi is most  consistent with a toxic metabolic injury, such as carbon monoxide  poisoning. 2. 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  3. 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      SLP Plan  Continue with current plan of care      Recommendations for follow up therapy are one component of a multi-disciplinary discharge planning process, led by the attending physician.  Recommendations may be updated based on patient status, additional functional criteria and insurance authorization.    Recommendations  Diet recommendations: Dysphagia 3 (mechanical soft);Thin liquid Liquids provided via:  Cup;Straw Medication Administration: Whole meds with puree Supervision: Staff to assist with self feeding Compensations: Minimize environmental distractions                  Oral care BID     Dysphagia, unspecified (R13.10)     Continue with current plan of care    Nicole [name] L. Nicole Frederic, MA CCC/SLP Clinical Specialist - Acute Care SLP Acute Rehabilitation Services Office number [phone]  Nicole [name] Nicole [name]  11/07/2022, 10:51 AM

## 2022-11-07 NOTE — Progress Notes (Signed)
Pt transferred to 2W24 with all belongings VSS. Receiving RN Zollie Scale at bedside. Mother [name] updated on transfer.

## 2022-11-08 DIAGNOSIS — J9601 Acute respiratory failure with hypoxia: Secondary | ICD-10-CM | POA: Diagnosis not present

## 2022-11-08 LAB — GLUCOSE, CAPILLARY
Glucose-Capillary: 104 mg/dL — ABNORMAL HIGH (ref 70–99)
Glucose-Capillary: 93 mg/dL (ref 70–99)
Glucose-Capillary: 136 mg/dL — ABNORMAL HIGH (ref 70–99)
Glucose-Capillary: 108 mg/dL — ABNORMAL HIGH (ref 70–99)

## 2022-11-08 LAB — PHOSPHORUS: Phosphorus: 5.4 mg/dL — ABNORMAL HIGH (ref 2.5–4.6)

## 2022-11-08 LAB — BASIC METABOLIC PANEL
Anion gap: 11 (ref 5–15)
BUN: 19 mg/dL (ref 6–20)
CO2: 25 mmol/L (ref 22–32)
Calcium: 8.3 mg/dL — ABNORMAL LOW (ref 8.9–10.3)
Chloride: 98 mmol/L (ref 98–111)
Creatinine, Ser: 0.87 mg/dL (ref 0.44–1.00)
GFR, Estimated: >60 mL/min (ref >60)
Glucose, Bld: 109 mg/dL — ABNORMAL HIGH (ref 70–99)
Potassium: 3.9 mmol/L (ref 3.5–5.1)
Sodium: 134 mmol/L — ABNORMAL LOW (ref 135–145)

## 2022-11-08 LAB — MAGNESIUM: Magnesium: 2 mg/dL (ref 1.7–2.4)

## 2022-11-08 NOTE — Progress Notes (Signed)
Physical Therapy Treatment Patient Details Name: Nicole [name] MRN: [id number] DOB: [birth year] Today's Date: 11/08/2022   History of Present Illness 47 y.o. female admitted 6/18 after being found down and unresponsive. She was brought in by EMS and intubated in ED (ETT 6/18-23). Dx includes bilateral pneumonia right greater than left, severe dehydration and hypernatremia, AKI, and concern for possible anoxic injury. MRI head 6/18: "Symmetric signal abnormality within the globi pallidi is most  consistent with a toxic metabolic injury, such as carbon monoxide  poisoning. 2. 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  3. Small amount of bifrontal SAH". PMH includes remote history of drug abuse.    PT Comments    Pt greeted resting in bed and agreeable to session. Pt continues to require significant multimodal cueing for to follow all single step commands with pt needing increased time and hand over hand assist to complete fully. Pt needing max-total assist +2 for bed mobility and functional transfers with pt demonstrating poor activation and poor initiation and motor planning throughout all mobility. Current plan remains appropriate to address deficits and maximize functional independence and decrease caregiver burden. Pt continues to benefit from skilled PT services to progress toward functional mobility goals.    Recommendations for follow up therapy are one component of a multi-disciplinary discharge planning process, led by the attending physician.  Recommendations may be updated based on patient status, additional functional criteria and insurance authorization.  Follow Up Recommendations       Assistance Recommended at Discharge Frequent or constant Supervision/Assistance  Patient can return home with the following Two people to help with walking and/or transfers;Two people to help with bathing/dressing/bathroom   Equipment Recommendations  None recommended by PT     Recommendations for Other Services       Precautions / Restrictions Precautions Precautions: Fall Precaution Comments: contact precautions Restrictions Weight Bearing Restrictions: No     Mobility  Bed Mobility Overal bed mobility: Needs Assistance Bed Mobility: Supine to Sit     Supine to sit: Max assist, +2 for physical assistance     General bed mobility comments: assist for trunk and BLEs    Transfers Overall transfer level: Needs assistance Equipment used: 2 person hand held assist Transfers: Sit to/from Stand Sit to Stand: Max assist, +2 physical assistance, +2 safety/equipment           General transfer comment: max A +2 to come to stand, L knee flexed throughout with downward gaze with pt unable to lift head to shift gaze up, max A +1 to stand and pivot to chair    Ambulation/Gait               General Gait Details: unable this session   Stairs             Wheelchair Mobility    Modified Rankin (Stroke Patients Only)       Balance Overall balance assessment: Needs assistance Sitting-balance support: Bilateral upper extremity supported, Feet supported Sitting balance-Leahy Scale: Poor Sitting balance - Comments: sitting EOB   Standing balance support: Bilateral upper extremity supported Standing balance-Leahy Scale: Poor Standing balance comment: Max A to maintain standing                            Cognition Arousal/Alertness: Awake/alert Behavior During Therapy: Flat affect Overall Cognitive Status: Impaired/Different from baseline Area of Impairment: Orientation, Attention, Following commands, Safety/judgement, Problem solving, Awareness  Orientation Level: Disoriented to, Time, Situation (oriented to year, disoriented to month and date of birth) Current Attention Level: Focused Memory: Decreased short-term memory Following Commands: Follows one step commands with increased  time Safety/Judgement: Decreased awareness of safety, Decreased awareness of deficits Awareness: Intellectual Problem Solving: Slow processing, Decreased initiation, Difficulty sequencing, Requires verbal cues, Requires tactile cues General Comments: Requires max tactile/verbal cues to follow simple 1 step commands        Exercises      General Comments General comments (skin integrity, edema, etc.): VSS on RA      Pertinent Vitals/Pain Pain Assessment Pain Assessment: Faces Faces Pain Scale: Hurts a little bit Pain Location: BLEs Pain Descriptors / Indicators: Discomfort, Grimacing Pain Intervention(s): Monitored during session, Limited activity within patient's tolerance, Repositioned    Home Living                          Prior Function            PT Goals (current goals can now be found in the care plan section) Acute Rehab PT Goals PT Goal Formulation: With patient/family Time For Goal Achievement: 11/20/22 Progress towards PT goals: Progressing toward goals    Frequency    Min 4X/week      PT Plan Current plan remains appropriate    Co-evaluation PT/OT/SLP Co-Evaluation/Treatment: Yes Reason for Co-Treatment: Complexity of the patient's impairments (multi-system involvement);For patient/therapist safety;To address functional/ADL transfers PT goals addressed during session: Mobility/safety with mobility;Balance OT goals addressed during session: ADL's and self-care      AM-PAC PT "6 Clicks" Mobility   Outcome Measure  Help needed turning from your back to your side while in a flat bed without using bedrails?: Total Help needed moving from lying on your back to sitting on the side of a flat bed without using bedrails?: Total Help needed moving to and from a bed to a chair (including a wheelchair)?: Total Help needed standing up from a chair using your arms (e.g., wheelchair or bedside chair)?: Total Help needed to walk in hospital room?:  Total Help needed climbing 3-5 steps with a railing? : Total 6 Click Score: 6    End of Session Equipment Utilized During Treatment: Gait belt Activity Tolerance: Patient tolerated treatment well;Patient limited by fatigue Patient left: in chair;with call bell/phone within reach;with chair alarm set Nurse Communication: Mobility status PT Visit Diagnosis: Other abnormalities of gait and mobility (R26.89);Muscle weakness (generalized) (M62.81);Hemiplegia and hemiparesis Hemiplegia - Right/Left: Left Hemiplegia - dominant/non-dominant: Non-dominant Hemiplegia - caused by: Unspecified;Cerebral infarction     Time: 0927-0958 PT Time Calculation (min) (ACUTE ONLY): 31 min  Charges:  $Therapeutic Activity: 8-22 mins                     Jag Lenz R. PTA Acute Rehabilitation Services Office: [phone]   Catalina Antigua 11/08/2022, 2:43 PM

## 2022-11-08 NOTE — Progress Notes (Signed)
Occupational Therapy Treatment Patient Details Name: Nicole [name] MRN: [id number] DOB: 20-Aug-1975 Today's Date: 11/08/2022   History of present illness 47 y.o. female admitted 6/18 after being found down and unresponsive. She was brought in by EMS and intubated in ED (ETT 6/18-23). Dx includes bilateral pneumonia right greater than left, severe dehydration and hypernatremia, AKI, and concern for possible anoxic injury. MRI head 6/18: "Symmetric signal abnormality within the globi pallidi is most  consistent with a toxic metabolic injury, such as carbon monoxide  poisoning. 2. 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  3. Small amount of bifrontal SAH". PMH includes remote history of drug abuse.   OT comments  Pt demonstrating fair progress towards therapy goals. Pt progressing with functional transfers from total A +2 to max A. Pt presents this session with BUE weakness, BLE pain and decreased cognition. Pt currently benefits from simple 1 step functional tasks. Pt demonstrating improved task initiation during functional tasks. Pt demonstrating core activation during EOB sitting requiring min guard to max A for occasional L lateral lean. Pt with forward head posture during session, noted improved neck posture to upright neutral position at end of session. Pt would benefit from continued acute OT services to maximize functional independence and facilitate transition to intensive inpatient follow up therapy, >3 hours/day after discharge. Current OT POC remains appropriate.     Recommendations for follow up therapy are one component of a multi-disciplinary discharge planning process, led by the attending physician.  Recommendations may be updated based on patient status, additional functional criteria and insurance authorization.    Assistance Recommended at Discharge Frequent or constant Supervision/Assistance  Patient can return home with the following  Two people to help with  walking and/or transfers;Two people to help with bathing/dressing/bathroom;Assistance with cooking/housework;Direct supervision/assist for medications management;Direct supervision/assist for financial management;Assist for transportation;Help with stairs or ramp for entrance   Equipment Recommendations  Other (comment) (defer)    Recommendations for Other Services      Precautions / Restrictions Precautions Precautions: Fall Precaution Comments: contact precautions Restrictions Weight Bearing Restrictions: No       Mobility Bed Mobility Overal bed mobility: Needs Assistance Bed Mobility: Supine to Sit     Supine to sit: Max assist, +2 for physical assistance     General bed mobility comments: assist for trunk and BLEs    Transfers Overall transfer level: Needs assistance Equipment used: 2 person hand held assist Transfers: Sit to/from Stand Sit to Stand: Max assist, +2 physical assistance, +2 safety/equipment           General transfer comment: STS transfer from EOB with initial max A +2, further STS transfer and stand pivot from bed>recliner with max A     Balance Overall balance assessment: Needs assistance Sitting-balance support: Bilateral upper extremity supported, Feet supported Sitting balance-Leahy Scale: Poor Sitting balance - Comments: sitting EOB   Standing balance support: Bilateral upper extremity supported Standing balance-Leahy Scale: Poor Standing balance comment: max A                           ADL either performed or assessed with clinical judgement   ADL Overall ADL's : Needs assistance/impaired                 Upper Body Dressing : Total assistance;Sitting Upper Body Dressing Details (indicate cue type and reason): doffed/donned hospital gown with total A, max cues for initiation and HOH Lower Body Dressing: Total assistance;Bed  level Lower Body Dressing Details (indicate cue type and reason): total A to don bilat socks  at bed level Toilet Transfer: Maximal assistance;Stand-pivot;BSC/3in1 Toilet Transfer Details (indicate cue type and reason): simulated Toileting- Clothing Manipulation and Hygiene: Total assistance Toileting - Clothing Manipulation Details (indicate cue type and reason): total A for posterior hygiene in standing post bowel incontinence.     Functional mobility during ADLs: Maximal assistance General ADL Comments: Limited secondary to cognition, BUE weakness, balance    Extremity/Trunk Assessment Upper Extremity Assessment RUE Deficits / Details: Difficult to assess 2/2 cognition. Decreased AROM noted this session. Hand tremor noted at rest. LUE Deficits / Details: Difficult to assess 2/2 cognition. Minimal spontaneous or volitional movement this session. Some digit flexion noted with functional task.            Vision   Vision Assessment?: No apparent visual deficits   Perception Perception Perception: Not tested   Praxis Praxis Praxis: Not tested    Cognition Arousal/Alertness: Awake/alert Behavior During Therapy: Flat affect Overall Cognitive Status: Impaired/Different from baseline Area of Impairment: Orientation, Attention, Following commands, Safety/judgement, Problem solving, Awareness                 Orientation Level: Disoriented to, Time, Situation (oriented to year, disoriented to month and date of birth) Current Attention Level: Focused Memory: Decreased short-term memory Following Commands: Follows one step commands with increased time Safety/Judgement: Decreased awareness of safety, Decreased awareness of deficits Awareness: Intellectual Problem Solving: Slow processing, Decreased initiation, Difficulty sequencing, Requires verbal cues, Requires tactile cues General Comments: Requires max tactile/verbal cues to follow simple 1 step commands        Exercises      Shoulder Instructions       General Comments      Pertinent Vitals/ Pain        Pain Assessment Pain Assessment: Faces Faces Pain Scale: Hurts a little bit Pain Location: BLEs Pain Descriptors / Indicators: Discomfort, Grimacing Pain Intervention(s): Limited activity within patient's tolerance, Monitored during session  Home Living       Type of Home: House                              Lives With: Alone    Prior Functioning/Environment              Frequency  Min 2X/week        Progress Toward Goals  OT Goals(current goals can now be found in the care plan section)     Acute Rehab OT Goals OT Goal Formulation: With patient Time For Goal Achievement: 11/20/22 Potential to Achieve Goals: Good ADL Goals Pt Will Perform Lower Body Dressing: with mod assist;sit to/from stand Pt Will Transfer to Toilet: with mod assist;bedside commode;stand pivot transfer Additional ADL Goal #1: Pt will utilize LUE as a functional assist during ADL tasks Additional ADL Goal #2: Pt will sequence 2-step ADL task with min cues  Plan Discharge plan remains appropriate;Frequency remains appropriate    Co-evaluation    PT/OT/SLP Co-Evaluation/Treatment: Yes Reason for Co-Treatment: Complexity of the patient's impairments (multi-system involvement);For patient/therapist safety;To address functional/ADL transfers   OT goals addressed during session: ADL's and self-care      AM-PAC OT "6 Clicks" Daily Activity     Outcome Measure   Help from another person eating meals?: A Lot Help from another person taking care of personal grooming?: A Lot Help from another person toileting, which includes  using toliet, bedpan, or urinal?: Total Help from another person bathing (including washing, rinsing, drying)?: Total Help from another person to put on and taking off regular upper body clothing?: Total Help from another person to put on and taking off regular lower body clothing?: Total 6 Click Score: 8    End of Session Equipment Utilized During Treatment:  Gait belt  OT Visit Diagnosis: Unsteadiness on feet (R26.81);Muscle weakness (generalized) (M62.81);Other symptoms and signs involving cognitive function   Activity Tolerance Patient tolerated treatment well   Patient Left in chair;with chair alarm set;with call bell/phone within reach   Nurse Communication Mobility status        Time: 1610-9604 OT Time Calculation (min): 31 min  Charges: OT General Charges $OT Visit: 1 Visit OT Treatments $Self Care/Home Management : 8-22 mins  Sherley Bounds, OTS Acute Rehabilitation Services Office [phone] Secure Chat Communication Preferred   Sherley Bounds 11/08/2022, 2:32 PM

## 2022-11-08 NOTE — Progress Notes (Signed)
Speech Language Pathology Treatment: Dysphagia  Patient Details Name: Nicole [name] MRN: [id number] DOB: [birth year] Today's Date: 11/08/2022 Time: 4098-1191 SLP Time Calculation (min) (ACUTE ONLY): 9 min  Assessment / Plan / Recommendation Clinical Impression  Pt was seen for treatment. She was alert and cooperative during the session, but repetition was consistently requested due to pt's significantly reduced vocal intensity. Pt consumed 9 consecutive swallows of thin liquids via straw and exhibited coughing thereafter, but this was not observed when a reduced rate was utilized. Pt requested that additional boluses be deferred. [name] current diet will be continued, but with observance of swallowing precautions.    ue HPI: 47 y.o. female admitted 6/18 after being found down and unresponsive. She was brought in by EMS and intubated in ED (ETT 6/18-23). Dx includes bilateral pneumonia right greater than left, severe dehydration and hypernatremia, AKI, and concern for possible anoxic injury. MRI head 6/18: "Symmetric signal abnormality within the globi pallidi is most  consistent with a toxic metabolic injury, such as carbon monoxide  poisoning. 2. 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  3. 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      SLP Plan  Continue with current plan of care      Recommendations for follow up therapy are one component of a multi-disciplinary discharge planning process, led by the attending physician.  Recommendations may be updated based on patient status, additional functional criteria and insurance authorization.    Recommendations  Diet recommendations: Regular;Thin liquid Liquids provided via: Cup;Straw Medication Administration: Whole meds  with puree Supervision: Staff to assist with self feeding Compensations: Minimize environmental distractions;Small sips/bites;Slow rate                  Oral care BID     Dysphagia, unspecified (R13.10)     Continue with current plan of care    Marcianne Ozbun I. Vear Clock, MS, CCC-SLP Neuro Diagnostic Specialist  Acute Rehabilitation Services Office number: [phone]  ALAENA STRADER  11/08/2022, 10:33 AM

## 2022-11-08 NOTE — Progress Notes (Signed)
Inpatient Rehab Admissions Coordinator:   I will place an order for CIR consult per our protocol.    Estill Dooms, PT, DPT Admissions Coordinator [phone] 11/08/22  2:45 PM

## 2022-11-08 NOTE — Hospital Course (Addendum)
Brief hospital course: Patient is a 47 year old female with polysubstance abuse, was found unresponsive by her by bystander next-door condemned building.  EMS was notified.  She was hypoxic on EMS arrival, requiring 15 L NRB.  In ED, she was unresponsive, tachypneic, diaphoretic and critically ill, WBCs 43.6, serum osmolality 379, sodium 164, K3.1, creatinine 2.5.  She was intubated for airway protection and hypoxic respiratory failure. Chest x-ray showed right basilar airspace disease.  CT head showed symmetric edema within bilateral globus palladi concerning for ischemic/hypoxic injury. CT chest abdomen pelvis showed right greater than left lower lobe consolidation, nodular groundglass opacities with lucency in the right lower lobe concerning for cavitary infection. Patient was admitted to ICU, started on IV vancomycin and cefepime.   Of note, mother reported no history of major medical illnesses. 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    Significant Hospital Events:  6/18 admitted after being found unresponsive and hypoxic with bilateral pneumonia right greater than left, severe dehydration and hypernatremia, AKI, and concern for possible anoxic injury intubated by EDP antibiotics initiated.  She was unresponsive postintubation and during intubation process 6/19 MRI shows bilateral globus pallidus injury 6/22 sedation now following commands predominantly on the right side. 6/23: Extubated 6/25: Transferred to telemetry level, TRH assumed care  Assessment and Plan: Acute respiratory failure with hypoxia, bilateral pneumonia, cavitary RLL PNA likely aspiration PNA, MRSA pneumonia  -Extubated on 6/23, currently stable on room air -Respiratory cultures positive for MRSA, blood cultures negative so far -ID following, Unasyn DC'd on 6/24, continue linezolid -2D echo showed no valvular  vegetation. -CT chest 6/22 showed interval progression of RUL LLL consolidative opacity with progression of cavitary component now measuring 3.2 cm with layering fluid level compatible with cavitary pneumonia, patchy nodular left lower lobe airspace disease. -Per pulmonology, lobar pneumonia with necrotizing component/abscess, recommended at least 2 weeks of antibiotics, needs follow-up checks x-ray in 8 to 10 weeks with consideration of CT if continues to be abnormal TEE requested.  Originally scheduled for 6/28.  Anesthesia had concerns with regards to her recent intubation requirement and therefore recommend to postpone the TEE.  Discussed with ID as well as cardiology currently both agreeable.  Will monitor. In the meantime patient will be on IV antibiotics.   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  -Patient was seen by neurosurgery on 6/18, no acute neurosurgical intervention recommended, supportive care, frequent exams, poor prognosis -2D echo showed EF of 60 to 65%, no regional WMA, normal diastolic parameters, RV systolic function normal, no vegetations Appreciate neurology evaluation.  Recommend supportive care. CIR has recommended EEG.  EEG was ordered and negative for any active seizures. Neurology has evaluated the patient on 6/25. Will provide IV hydration to see if improving hydration status improves patient's mentation.   Substance use, cocaine abuse UDS on admission positive for cocaine, alcohol level less than 10, salicylate less than 7.0   Severe sepsis with lactic acidosis, due to  pneumonia Patient met SIRS criteria on admission with tachypnea hypotension, hypothermia and leukocytosis with source of infection being MRSA pneumonia. Required ventilator for  respiratory failure as organ damage. Did not require pressors.  No evidence of shock. Sepsis physiology has resolved.   AKI with multiple electrolyte abnormalities, anion gap metabolic acidosis Admission serum creatinine 2.57.  Currently less than 1.  No prior baseline available but most likely baseline is normal based on current creatinine. Most likely combination of hypovolemia and ATN. Monitor for now.   hypernatremia On admission serum sodium 165.  Improved with IV hydration.   Hypokalemia hypocalcemia Improved, replace as needed   Mild transaminitis with rhabdomyolysis CK peaked at 900. LFTs peaked at 100.  Both improving.   Left buttock unstageable ulcer, present on admission.  Continue foam dressing.

## 2022-11-08 NOTE — Progress Notes (Signed)
Mobility Specialist Progress Note   11/08/22 1822  Mobility  Activity Transferred from bed to chair  Level of Assistance +2 (takes two people) (MaxA)  Assistive Device [employer]  Distance Ambulated (ft) 5 ft  Range of Motion/Exercises Active  Activity Response Tolerated fair  Mobility Referral No  $Mobility charge 1 Mobility  Mobility Specialist Start Time (ACUTE ONLY) 1750  Mobility Specialist Stop Time (ACUTE ONLY) 1830  Mobility Specialist Time Calculation (min) (ACUTE ONLY) 40 min   NT requesting assistance to get pt back to bed. +2A maxA to stand pivot pt to bed w/ bil knee block d/t general weakness. Once in bed, total care for pericare, pt tolerating rolls well and left w/ call bell in reach and bed alarm on.  Frederico Hamman Mobility Specialist Please contact via SecureChat or  Rehab office at [phone]

## 2022-11-08 NOTE — Evaluation (Signed)
Speech Language Pathology Evaluation Patient Details Name: Nicole [name] MRN: [id number] DOB: [birth year] Today's Date: 11/08/2022 Time: 4098-1191 SLP Time Calculation (min) (ACUTE ONLY): 15 min  Problem List:  Patient Active Problem List   Diagnosis Date Noted   Cavitary pneumonia 11/05/2022   Substance use 11/05/2022   Acute respiratory failure (HCC) 10/31/2022   Past Medical History: History reviewed. No pertinent past medical history. Past Surgical History: History reviewed. No pertinent surgical history. HPI:  47 y.o. female admitted 6/18 after being found down and unresponsive. She was brought in by EMS and intubated in ED (ETT 6/18-23). Dx includes bilateral pneumonia right greater than left, severe dehydration and hypernatremia, AKI, and concern for possible anoxic injury. MRI head 6/18: "Symmetric signal abnormality within the globi pallidi is most  consistent with a toxic metabolic injury, such as carbon monoxide  poisoning. 2. 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  3. 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   Assessment / Plan / Recommendation Clinical Impression  Pt was seen for speech-language-cognition evaluation which was limited due to pt's participation and her ultimately requesting that the evaluation be discontinued. Pt reported that she lives alone, completed some college, and that she was employed prior to admission working in Radiographer, therapeutic. Assessment was difficult due to pt's significantly reduced vocal intensity with her whispering all responses to questions and SLP consistently having to request clarification. Pt was evaluated informally and with portions of the [facility] Mental Status Examination. She exhibited  difficulty in the areas of awareness, attention, and memory. Further skilled SLP services are clinically indicated at this time for diagnostic treatment.    SLP Assessment  SLP Recommendation/Assessment: Patient needs continued Speech Lanaguage Pathology Services SLP Visit Diagnosis: Cognitive communication deficit (R41.841)    Recommendations for follow up therapy are one component of a multi-disciplinary discharge planning process, led by the attending physician.  Recommendations may be updated based on patient status, additional functional criteria and insurance authorization.    Follow Up Recommendations  Acute inpatient rehab (3hours/day)    Assistance Recommended at Discharge  Frequent or constant Supervision/Assistance  Functional Status Assessment Patient has had a recent decline in their functional status and demonstrates the ability to make significant improvements in function in a reasonable and predictable amount of time.  Frequency and Duration min 2x/week  2 weeks      SLP Evaluation Cognition  Overall Cognitive Status: Impaired/Different from baseline Arousal/Alertness: Awake/alert Orientation Level: Oriented to person;Oriented to place Year: 2024 Month: June Day of Week: Incorrect Attention: Focused;Sustained Focused Attention: Appears intact Sustained Attention: Impaired Sustained Attention Impairment: Verbal complex Memory: Impaired Memory Impairment:  (Immediate: 5/5; delayed: 0/5; with cues: 0/5) Awareness: Impaired Awareness Impairment: Intellectual impairment       Comprehension  Auditory Comprehension Yes/No Questions: Within Functional Limits Conversation: Simple    Expression Expression Primary Mode of Expression: Verbal Verbal Expression Level of Generative/Spontaneous Verbalization: Conversation Repetition: No impairment Naming: Impairment Divergent:  (2 in one minute)   Oral / Motor  Oral Motor/Sensory Function Overall Oral Motor/Sensory  Function: Mild impairment Facial ROM: Reduced left;Suspected CN VII (facial) dysfunction Facial Symmetry: Abnormal symmetry left;Suspected CN VII (facial) dysfunction Motor Speech Overall Motor Speech:  (difficult to assess) Respiration: Impaired Level of Impairment: Sentence Phonation: Low vocal  intensity;Breathy           Nicole Kareem I. Vear Clock, MS, CCC-SLP Neuro Diagnostic Specialist  Acute Rehabilitation Services Office number: [phone]  YIRAN SHABAN 11/08/2022, 10:53 AM

## 2022-11-08 NOTE — Progress Notes (Signed)
[facility] Progress Note Patient: Nicole [name] NWG:[id number] DOB: 1975/10/23 DOA: 10/31/2022  DOS: the patient was seen and examined on 11/08/2022  Brief hospital course: Patient is a 47 year old female with polysubstance abuse, was found unresponsive by her by bystander next-door condemned building.  EMS was notified.  She was hypoxic on EMS arrival, requiring 15 L NRB.  In ED, she was unresponsive, tachypneic, diaphoretic and critically ill, WBCs 43.6, serum osmolality 379, sodium 164, K3.1, creatinine 2.5.  She was intubated for airway protection and hypoxic respiratory failure. Chest x-ray showed right basilar airspace disease.  CT head showed symmetric edema within bilateral globus palladi concerning for ischemic/hypoxic injury. CT chest abdomen pelvis showed right greater than left lower lobe consolidation, nodular groundglass opacities with lucency in the right lower lobe concerning for cavitary infection. Patient was admitted to ICU, started on IV vancomycin and cefepime.   Of note, mother reported no history of major medical illnesses. 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    Significant Hospital Events:  6/18 admitted after being found unresponsive and hypoxic with bilateral pneumonia right greater than left, severe dehydration and hypernatremia, AKI, and concern for possible anoxic injury intubated by EDP antibiotics initiated.  She was unresponsive postintubation and during intubation process 6/19 MRI shows bilateral globus pallidus injury 6/22 sedation now following commands predominantly on the right side. 6/23: Extubated 6/25: Transferred to telemetry level, TRH assumed care  Assessment and Plan: Acute respiratory failure with hypoxia, bilateral pneumonia, cavitary RLL  PNA likely aspiration PNA, MRSA pneumonia  -Extubated on 6/23,  currently stable on room air -Respiratory cultures positive for MRSA, blood cultures negative so far -ID following, Unasyn DC'd on 6/24, continue linezolid -2D echo showed no valvular vegetation. -CT chest 6/22 showed interval progression of RUL LLL consolidative opacity with progression of cavitary component now measuring 3.2 cm with layering fluid level compatible with cavitary pneumonia, patchy nodular left lower lobe airspace disease. -Per pulmonology, lobar pneumonia with necrotizing component/abscess, recommended at least 2 weeks of antibiotics, needs follow-up checks x-ray in 8 to 10 weeks with consideration of CT if continues to be abnormal TEE requested.  Currently scheduled for 6/28.   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  -Patient was seen by neurosurgery on 6/18, no acute neurosurgical intervention recommended, supportive care, frequent exams, poor prognosis -Slow to respond, oriented, follows commands, bilateral lower extremity weakness, LUE weakness, will consult neurology -2D echo showed EF of 60 to 65%, no regional WMA, normal diastolic parameters, RV systolic function normal, no vegetations Appreciate neurology evaluation.  Recommend supportive care.   Substance use, cocaine abuse -UDS on admission positive for cocaine, alcohol level less than 10, salicylate less than 7.0   Sepsis with lactic acidosis, due to pneumonia -Patient met sepsis criteria on admission, now improved, off the vasopressors -Lactic acidosis resolved   AKI with multiple electrolyte abnormalities, anion gap metabolic  acidosis -Creatinine 2.57 on admission with ALT metabolic acidosis likely due to septic ATN versus hypovolemia -Improved, creatinine 0.78   Profound hypernatremia -Sodium 165 on admission, due to profound hypovolemia, sepsis -  Sodium improved,    Hypokalemia hypocalcemia -Improved, replace as needed   Mild transaminitis with rhabdomyolysis -CK 307 on admission -LFTs improving   Left buttock unstageable ulcer, present on admission.  Continue foam dressing.   Subjective: No acute complaint.  No nausea no vomiting no fever no chills.  Her speech is pressed.  No events overnight.  Physical Exam: General: in Mild distress, No Rash Cardiovascular: S1 and S2 Present, No Murmur Respiratory: Good respiratory effort, Bilateral Air entry present. No Crackles, No wheezes Abdomen: Bowel Sound present, No tenderness Extremities: No edema Neuro: Alert and oriented x3, no new focal deficit, left-sided weakness bilaterally  Data Reviewed: I have Reviewed nursing notes, Vitals, and Lab results. Since last encounter, pertinent lab results CBC and BMP   . I have ordered test including CBC and BMP  .   Disposition: Status is: Inpatient Remains inpatient appropriate because: Need for rehab and close observation  enoxaparin (LOVENOX) injection 40 mg Start: 11/01/22 1400 SCDs Start: 10/31/22 1802   Family Communication: Mother at bedside Level of care: Telemetry Medical   Vitals:   11/07/22 2045 11/08/22 0531 11/08/22 0755 11/08/22 1630  BP: (!) 98/57 (!) 103/57 101/64 101/72  Pulse: 96 92 89 95  Resp:  17 18 18   Temp: 99.8 F (37.7 C) 98.6 F (37 C)  97.7 F (36.5 C)  TempSrc:      SpO2: 95% 96% 98% 98%  Weight:      Height:         Author: Lynden Oxford, MD 11/08/2022 6:10 PM  Please look on [email or web address] to find out who is on call.

## 2022-11-08 NOTE — TOC Progression Note (Signed)
Transition of Care [facility]) - Progression Note    Patient Details  Name: Nicole [name] MRN: [id number] Date of Birth: [birth year]  Transition of Care [facility]) CM/SW Contact  Tom-Johnson, Hershal Coria, RN Phone Number: 11/08/2022, 1:35 PM  Clinical Narrative:     CIR recommended, patient is total assist with mobility and not at a level to tolerate the intensity of CIR at this time. CIR  will follow and monitor for progress.  TOC will continue to follow as patient progresses with care towards discharge.        Expected Discharge Plan and Services                                               Social Determinants of Health (SDOH) Interventions    Readmission Risk Interventions     No data to display

## 2022-11-09 ENCOUNTER — Inpatient Hospital Stay (HOSPITAL_COMMUNITY): Payer: MEDICAID

## 2022-11-09 ENCOUNTER — Inpatient Hospital Stay (HOSPITAL_COMMUNITY): Payer: MEDICAID | Admitting: Registered Nurse

## 2022-11-09 DIAGNOSIS — J984 Other disorders of lung: Secondary | ICD-10-CM | POA: Diagnosis not present

## 2022-11-09 DIAGNOSIS — J9601 Acute respiratory failure with hypoxia: Secondary | ICD-10-CM | POA: Diagnosis not present

## 2022-11-09 DIAGNOSIS — I6389 Other cerebral infarction: Secondary | ICD-10-CM

## 2022-11-09 DIAGNOSIS — J189 Pneumonia, unspecified organism: Secondary | ICD-10-CM

## 2022-11-09 DIAGNOSIS — F1990 Other psychoactive substance use, unspecified, uncomplicated: Secondary | ICD-10-CM

## 2022-11-09 LAB — BASIC METABOLIC PANEL
Anion gap: 7 (ref 5–15)
BUN: 24 mg/dL — ABNORMAL HIGH (ref 6–20)
CO2: 26 mmol/L (ref 22–32)
Calcium: 7.9 mg/dL — ABNORMAL LOW (ref 8.9–10.3)
Chloride: 100 mmol/L (ref 98–111)
Creatinine, Ser: 0.75 mg/dL (ref 0.44–1.00)
GFR, Estimated: >60 mL/min (ref >60)
Glucose, Bld: 155 mg/dL — ABNORMAL HIGH (ref 70–99)
Potassium: 3.1 mmol/L — ABNORMAL LOW (ref 3.5–5.1)
Sodium: 133 mmol/L — ABNORMAL LOW (ref 135–145)

## 2022-11-09 LAB — GLUCOSE, CAPILLARY
Glucose-Capillary: 101 mg/dL — ABNORMAL HIGH (ref 70–99)
Glucose-Capillary: 84 mg/dL (ref 70–99)
Glucose-Capillary: 188 mg/dL — ABNORMAL HIGH (ref 70–99)
Glucose-Capillary: 113 mg/dL — ABNORMAL HIGH (ref 70–99)

## 2022-11-09 LAB — MAGNESIUM: Magnesium: 2 mg/dL (ref 1.7–2.4)

## 2022-11-09 LAB — PHOSPHORUS: Phosphorus: 4.3 mg/dL (ref 2.5–4.6)

## 2022-11-09 MED ORDER — POTASSIUM CHLORIDE CRYS ER 20 MEQ PO TBCR
40.0000 meq | EXTENDED_RELEASE_TABLET | ORAL | Status: AC
  Administered 2022-11-09 (×2): 40 meq via ORAL
  Filled 2022-11-09 (×2): qty 2

## 2022-11-09 MED ORDER — ONDANSETRON HCL 4 MG/2ML IJ SOLN: 4.0000 mg | Freq: Four times a day (QID) | INTRAMUSCULAR | Status: DC | PRN

## 2022-11-09 NOTE — Plan of Care (Signed)
Patient is resting in bed with normal vitals and respirations. Mother is at bedside. Patient was cleaned and turned to right side. All meds given and tolerated well. No distress noted. Plan of care is ongoing.  Problem: Education: Goal: Knowledge of General Education information will improve Description: Including pain rating scale, medication(s)/side effects and non-pharmacologic comfort measures Outcome: Progressing   Problem: Health Behavior/Discharge Planning: Goal: Ability to manage health-related needs will improve Outcome: Progressing   Problem: Clinical Measurements: Goal: Ability to maintain clinical measurements within normal limits will improve Outcome: Progressing Goal: Will remain free from infection Outcome: Progressing Goal: Diagnostic test results will improve Outcome: Progressing Goal: Respiratory complications will improve Outcome: Progressing Goal: Cardiovascular complication will be avoided Outcome: Progressing   Problem: Activity: Goal: Risk for activity intolerance will decrease Outcome: Progressing   Problem: Nutrition: Goal: Adequate nutrition will be maintained Outcome: Progressing   Problem: Coping: Goal: Level of anxiety will decrease Outcome: Progressing   Problem: Elimination: Goal: Will not experience complications related to bowel motility Outcome: Progressing Goal: Will not experience complications related to urinary retention Outcome: Progressing   Problem: Pain Managment: Goal: General experience of comfort will improve Outcome: Progressing   Problem: Safety: Goal: Ability to remain free from injury will improve Outcome: Progressing   Problem: Skin Integrity: Goal: Risk for impaired skin integrity will decrease Outcome: Progressing   Problem: Safety: Goal: Non-violent Restraint(s) Outcome: Progressing

## 2022-11-09 NOTE — Progress Notes (Signed)
Physical Therapy Treatment Patient Details Name: Nicole [name] MRN: [id number] DOB: August 06, 1975 Today's Date: 11/09/2022   History of Present Illness 47 y.o. female admitted 6/18 after being found down and unresponsive. She was brought in by EMS and intubated in ED (ETT 6/18-23). Dx includes bilateral pneumonia right greater than left, severe dehydration and hypernatremia, AKI, and concern for possible anoxic injury. MRI head 6/18: "Symmetric signal abnormality within the globi pallidi is most  consistent with a toxic metabolic injury, such as carbon monoxide  poisoning. 2. 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  3. Small amount of bifrontal SAH". PMH includes remote history of drug abuse.    PT Comments    Pt demonstrating steady progress towards acute goals this session, however pt continues to be limited by impaired cognition, limiting pt ability to follow mobility commands. Pt able to demonstrate ability to follow functional commands this session, brushing hair and donning eye glasses however pt unable to reach for rail or pull into long sitting without significant assist and max multimodal cueing. Pt needing hand over hand assist for all LE therex. Pt continues to demonstrate forward head posture throughout session with ability to correct with max cues and assist but unable to maintain. Pt continues to benefit from skilled PT services to progress toward functional mobility goals.     Recommendations for follow up therapy are one component of a multi-disciplinary discharge planning process, led by the attending physician.  Recommendations may be updated based on patient status, additional functional criteria and insurance authorization.  Follow Up Recommendations       Assistance Recommended at Discharge Frequent or constant Supervision/Assistance  Patient can return home with the following Two people to help with walking and/or transfers;Two people to help with  bathing/dressing/bathroom   Equipment Recommendations  None recommended by PT    Recommendations for Other Services       Precautions / Restrictions Precautions Precautions: Fall Precaution Comments: contact precautions Restrictions Weight Bearing Restrictions: No     Mobility  Bed Mobility Overal bed mobility: Needs Assistance             General bed mobility comments: pt unable to follow mobility commands, moved to chair position in bed to focus on trunk control and LE therex    Transfers                        Ambulation/Gait                   Stairs             Wheelchair Mobility    Modified Rankin (Stroke Patients Only) Modified Rankin (Stroke Patients Only) Pre-Morbid Rankin Score: No symptoms Modified Rankin: Severe disability     Balance Overall balance assessment: Needs assistance Sitting-balance support: Bilateral upper extremity supported, Feet supported Sitting balance-Leahy Scale: Poor Sitting balance - Comments: sitting EOB Postural control: Posterior lean                                  Cognition Arousal/Alertness: Awake/alert Behavior During Therapy: Flat affect Overall Cognitive Status: Impaired/Different from baseline Area of Impairment: Orientation, Attention, Following commands, Safety/judgement, Problem solving, Awareness                 Orientation Level: Disoriented to, Time, Situation (unable to state birth year) Current Attention Level: Focused Memory: Decreased short-term memory Following Commands:  Follows one step commands with increased time Safety/Judgement: Decreased awareness of safety, Decreased awareness of deficits Awareness: Intellectual Problem Solving: Slow processing, Decreased initiation, Difficulty sequencing, Requires verbal cues, Requires tactile cues General Comments: Requires max tactile/verbal cues to follow simple 1 step mobility commands, however pt with  increased ability to complete grooming tasks (able to brush hair, and don glasses, but unable to reach for rail or complete LE exercise)        Exercises General Exercises - Lower Extremity Ankle Circles/Pumps: PROM, Right, Left, 10 reps, Supine Short Arc Quad: AAROM, Right, Left, PROM, 10 reps, Seated Heel Slides: AROM, Right, 5 reps, Supine Other Exercises Other Exercises: pulling up into unsupported sitting x10    General Comments General comments (skin integrity, edema, etc.): VSS on RA      Pertinent Vitals/Pain Pain Assessment Pain Assessment: Faces Faces Pain Scale: Hurts a little bit Pain Location: BLEs with movement Pain Descriptors / Indicators: Discomfort, Grimacing Pain Intervention(s): Monitored during session, Limited activity within patient's tolerance, Repositioned    Home Living                          Prior Function            PT Goals (current goals can now be found in the care plan section) Acute Rehab PT Goals Patient Stated Goal: per mother, return to PLOF PT Goal Formulation: With patient/family Time For Goal Achievement: 11/20/22 Progress towards PT goals: Progressing toward goals    Frequency    Min 4X/week      PT Plan Current plan remains appropriate    Co-evaluation              AM-PAC PT "6 Clicks" Mobility   Outcome Measure  Help needed turning from your back to your side while in a flat bed without using bedrails?: Total Help needed moving from lying on your back to sitting on the side of a flat bed without using bedrails?: Total Help needed moving to and from a bed to a chair (including a wheelchair)?: Total Help needed standing up from a chair using your arms (e.g., wheelchair or bedside chair)?: Total Help needed to walk in hospital room?: Total Help needed climbing 3-5 steps with a railing? : Total 6 Click Score: 6    End of Session   Activity Tolerance: Patient tolerated treatment well;Patient limited  by fatigue Patient left: with call bell/phone within reach;in bed;with bed alarm set;Other (comment) (with bed in chair position) Nurse Communication: Mobility status PT Visit Diagnosis: Other abnormalities of gait and mobility (R26.89);Muscle weakness (generalized) (M62.81);Hemiplegia and hemiparesis Hemiplegia - Right/Left: Left Hemiplegia - dominant/non-dominant: Non-dominant Hemiplegia - caused by: Unspecified;Cerebral infarction     Time: 1610-9604 PT Time Calculation (min) (ACUTE ONLY): 23 min  Charges:  $Therapeutic Exercise: 8-22 mins $Therapeutic Activity: 8-22 mins                     Anmol Paschen R. PTA Acute Rehabilitation Services Office: [phone]   Catalina Antigua 11/09/2022, 10:40 AM

## 2022-11-09 NOTE — Progress Notes (Signed)
[facility] Progress Note Patient: Nicole [name] ION:[id number] DOB: August 31, 1975 DOA: 10/31/2022  DOS: the patient was seen and examined on 11/09/2022  Brief hospital course: Patient is a 47 year old female with polysubstance abuse, was found unresponsive by her by bystander next-door condemned building.  EMS was notified.  She was hypoxic on EMS arrival, requiring 15 L NRB.  In ED, she was unresponsive, tachypneic, diaphoretic and critically ill, WBCs 43.6, serum osmolality 379, sodium 164, K3.1, creatinine 2.5.  She was intubated for airway protection and hypoxic respiratory failure. Chest x-ray showed right basilar airspace disease.  CT head showed symmetric edema within bilateral globus palladi concerning for ischemic/hypoxic injury. CT chest abdomen pelvis showed right greater than left lower lobe consolidation, nodular groundglass opacities with lucency in the right lower lobe concerning for cavitary infection. Patient was admitted to ICU, started on IV vancomycin and cefepime.   Of note, mother reported no history of major medical illnesses. 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    Significant Hospital Events:  6/18 admitted after being found unresponsive and hypoxic with bilateral pneumonia right greater than left, severe dehydration and hypernatremia, AKI, and concern for possible anoxic injury intubated by EDP antibiotics initiated.  She was unresponsive postintubation and during intubation process 6/19 MRI shows bilateral globus pallidus injury 6/22 sedation now following commands predominantly on the right side. 6/23: Extubated 6/25: Transferred to telemetry level, TR[facility] assumed care  Assessment and Plan: Acute respiratory failure with hypoxia, bilateral pneumonia, cavitary RLL PNA likely aspiration PNA, MRSA pneumonia  -Extubated on 6/23, currently stable on room  air -Respiratory cultures positive for MRSA, blood cultures negative so far -ID following, Unasyn DC'd on 6/24, continue linezolid -2D echo showed no valvular vegetation. -CT chest 6/22 showed interval progression of RUL LLL consolidative opacity with progression of cavitary component now measuring 3.2 cm with layering fluid level compatible with cavitary pneumonia, patchy nodular left lower lobe airspace disease. -Per pulmonology, lobar pneumonia with necrotizing component/abscess, recommended at least 2 weeks of antibiotics, needs follow-up checks x-ray in 8 to 10 weeks with consideration of CT if continues to be abnormal TEE requested.  Currently scheduled for 6/28.   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  -Patient was seen by neurosurgery on 6/18, no acute neurosurgical intervention recommended, supportive care, frequent exams, poor prognosis -2D echo showed EF of 60 to 65%, no regional WMA, normal diastolic parameters, RV systolic function normal, no vegetations Appreciate neurology evaluation.  Recommend supportive care. CIR has recommended EEG.  I will order it although I am not currently worried about patient having any seizure events.  Neurology has evaluated the patient on 6/25.   Substance use, cocaine abuse UDS on admission positive for cocaine, alcohol level less than 10, salicylate less than 7.0   Severe sepsis with lactic acidosis, due to pneumonia Patient met SIRS criteria on admission with tachypnea hypotension, hypothermia and leukocytosis with source of infection being MRSA  pneumonia. Required ventilator for respiratory failure as organ damage. Did not require pressors.  No evidence of shock. Sepsis physiology has resolved.   AKI with  multiple electrolyte abnormalities, anion gap metabolic acidosis Admission serum creatinine 2.57.  Currently less than 1.  No prior baseline available but most likely baseline is normal based on current creatinine. Most likely combination of hypovolemia and ATN. Monitor for now.   hypernatremia On admission serum sodium 165.  Improved with IV hydration.   Hypokalemia hypocalcemia Improved, replace as needed   Mild transaminitis with rhabdomyolysis CK peaked at 900. LFTs peaked at 100.  Both improving.   Left buttock unstageable ulcer, present on admission.  Continue foam dressing.   Subjective: Mother at bedside.  Patient appears to be more sleepy today.  No tremors noted at the time of my evaluation.  Eating her lunch with the assistance of mother.  No nausea no vomiting.  No other acute complaint.  Able to follow commands.  Physical Exam: General: in Mild distress, No Rash Cardiovascular: S1 and S2 Present, No Murmur Respiratory: Good respiratory effort, Bilateral Air entry present. No Crackles, No wheezes Abdomen: Bowel Sound present, No tenderness Extremities: No edema Neuro: Alert and speech is muffled again today.  Unchanged left neglect.  Unchanged left weakness.  No new focal deficit.  No tremor seen.  Data Reviewed: I have Reviewed nursing notes, Vitals, and Lab results. Since last encounter, pertinent lab results CBC and BMP   . I have ordered test including BMP and magnesium  .   Disposition: Status is: Inpatient Remains inpatient appropriate because: Awaiting CIR placement.  Need further workup.  enoxaparin (LOVENOX) injection 40 mg Start: 11/01/22 1400 SCDs Start: 10/31/22 1802   Family Communication: Mother at bedside Level of care: Telemetry Medical   Vitals:   11/08/22 1630 11/08/22 2002  11/09/22 0542 11/09/22 0805  BP: 101/72 92/61 103/70 112/69  Pulse: 95 61 79 93  Resp: 18 15 16 18   Temp: 97.7 F (36.5 C) 97.6 F (36.4 C) 97.7 F (36.5 C) 98.6 F (37 C)  TempSrc:   Oral   SpO2: 98% 97% 98% 95%  Weight:      Height:         Author: Lynden Oxford, MD 11/09/2022 3:47 PM  Please look on [email or web address] to find out who is on call.

## 2022-11-09 NOTE — Consult Note (Signed)
Physical Medicine and Rehabilitation Consult Reason for Consult: Evaluate appropriateness for Inpatient Rehab Referring Physician: Dr. Lynden Oxford    HPI: Nicole [name] is a 47 y.o. female with PMHx of  has no past medical history on file. . They were admitted to [facility] on 10/31/2022 after she was found unresponsive by EMS with a bystander in a field next to condemned building.  She was hypoxic and required 15 L with EMS, and in the ED evaluation was significant for hypothermia, WBC 43.6, lactic acid 4.4, CK3 100, AST/ALTs mid 100s, serum osmolality 379, sodium 164, creatinine 2.5; she was intubated for airway protection and chest x-ray showed right basilar airspace disease.  UDS on admission positive for cocaine.  CT head showed symmetric edema with bilateral globus labia concerning for hypoxic brain injury, and CT chest showed left lower lobe consolidation with nodular groundglass opacities today's right lower lobe lucency concerning for cavitary infection.  She was started on vancomycin and cefepime, aggressively rehydrated with crystalloid fluids, and admitted to critical care.  MRI showed bilateral globus pallidus injury consistent with cocaine use, and bilateral corona radiata deep infarcts, along with small bifrontal subarachnoid hemorrhage.  Neurosurgery team no acute neurosurgical intervention, supportive care. Infectious disease and pulmonology were consulted regarding necrotizing pneumonia, started switch to cefepime/doxycycline/Unasyn, repeat CT chest chest 6/22 showed progression of necrotizing pneumonia and antibiotics were switched again to linezolid; will need at least 2 weeks antibiotics and serial chest x-ray at 8 to 10 weeks.  She was extubated 6-23, neurology was consulted on 6/25 due to neurologic deficits on emergence including bilateral lower extremity and left upper extremity weakness, determined no acute stroke workup and likely chronic hypoperfusion contributing  to bilateral watershed infarcts, recommended secondary prevention with avoidance of cocaine and hypotension.  Hospitalization has further been complicated by sepsis status post vasopressors, AKI now resolved, hypernatremia now improved, and transaminitis with rhabdomyolysis now improving.  She was found to have a left buttocks unstageable ulcer on admission, managed with foam dressings.  PM&R was consulted to evaluate appropriateness for IPR admission.    Exam, patient is lethargic and encephalopathic, can answer some basic orientation questions but otherwise has difficulty concentrating long enough to answer questions.  Per chart review, dispo planning is home with mother to a single level house with no steps to enter, mother can provide some assistance but works a couple days per week.  Patient was independent prior to admission.    ROS History reviewed. No pertinent past medical history. History reviewed. No pertinent surgical history. History reviewed. No pertinent family history. Social History:  has no history on file for tobacco use, alcohol use, and drug use. Allergies: Not on File No medications prior to admission.    Home: Home Living Family/patient expects to be discharged to:: Private residence Living Arrangements: Parent (mother) Available Help at Discharge: Family, Other (Comment) (pt's mother works x2 days a week) Type of Home: House Home Access: Level entry Home Layout: One level Bathroom Shower/Tub: Health visitor: Standard Home Equipment: Medical laboratory scientific officer - single point Additional Comments: Currently lives with mom who works a couple days per week; home setup info gathered from [employer] mom  Lives With: Alone  Functional History: Prior Function Prior Level of Function : Independent/Modified Independent, Patient poor historian/Family not available Mobility Comments: per pt and mother, pt is independent with mobility PTA. [name] states pt would be gone for up to a  week at a time ADLs Comments:  pt reports independence Functional Status:  Mobility: Bed Mobility Overal bed mobility: Needs Assistance Bed Mobility: Supine to Sit Rolling: Total assist Supine to sit: Max assist, +2 for physical assistance Sit to supine: Max assist, +2 for physical assistance General bed mobility comments: assist for trunk and BLEs Transfers Overall transfer level: Needs assistance Equipment used: 2 person hand held assist Transfers: Sit to/from Stand Sit to Stand: Max assist, +2 physical assistance, +2 safety/equipment General transfer comment: max A +2 to come to stand, L knee flexed throughout with downward gaze with pt unable to lift head to shift gaze up, max A +1 to stand and pivot to chair Ambulation/Gait General Gait Details: unable this session    ADL: ADL Overall ADL's : Needs assistance/impaired Eating/Feeding: Moderate assistance, Sitting Grooming: Moderate assistance, Sitting Grooming Details (indicate cue type and reason): washed face and L hand with washcloth using RUE while sitting EOB with max A for sitting balance and mod cues for thoroughness. L hand placed in lap by therapist during task. Upper Body Bathing: Moderate assistance, Sitting Lower Body Bathing: Maximal assistance, +2 for physical assistance, +2 for safety/equipment, Sit to/from stand Upper Body Dressing : Total assistance, Sitting Upper Body Dressing Details (indicate cue type and reason): doffed/donned hospital gown with total A, max cues for initiation and HOH Lower Body Dressing: Total assistance, Bed level Lower Body Dressing Details (indicate cue type and reason): total A to don bilat socks at bed level Toilet Transfer: Maximal assistance, Stand-pivot, BSC/3in1 Toilet Transfer Details (indicate cue type and reason): simulated Toileting- Clothing Manipulation and Hygiene: Total assistance Toileting - Clothing Manipulation Details (indicate cue type and reason): total A for  posterior hygiene in standing post bowel incontinence. Functional mobility during ADLs: Maximal assistance General ADL Comments: Limited secondary to cognition, BUE weakness, balance  Cognition: Cognition Overall Cognitive Status: Impaired/Different from baseline Arousal/Alertness: Awake/alert Orientation Level: Oriented to person Year: 2024 Month: June Day of Week: Incorrect Attention: Focused, Sustained Focused Attention: Appears intact Sustained Attention: Impaired Sustained Attention Impairment: Verbal complex Memory: Impaired Memory Impairment:  (Immediate: 5/5; delayed: 0/5; with cues: 0/5) Awareness: Impaired Awareness Impairment: Intellectual impairment Cognition Arousal/Alertness: Awake/alert Behavior During Therapy: Flat affect Overall Cognitive Status: Impaired/Different from baseline Area of Impairment: Orientation, Attention, Following commands, Safety/judgement, Problem solving, Awareness Orientation Level: Disoriented to, Time, Situation (oriented to year, disoriented to month and date of birth) Current Attention Level: Focused Memory: Decreased short-term memory Following Commands: Follows one step commands with increased time Safety/Judgement: Decreased awareness of safety, Decreased awareness of deficits Awareness: Intellectual Problem Solving: Slow processing, Decreased initiation, Difficulty sequencing, Requires verbal cues, Requires tactile cues General Comments: Requires max tactile/verbal cues to follow simple 1 step commands  Blood pressure 112/69, pulse 93, temperature 98.6 F (37 C), resp. rate 18, height 5\' 1"  (1.549 m), weight 50.6 kg, SpO2 95 %.  Physical Exam Constitutional: No apparent distress.  Awake, sitting in bed, lethargic. HENT: No JVD. Neck Supple. Trachea midline. +Neck weakness unable to lift head off pillow.  Eyes: PERRLA. EOMI. nonresponsive to visual field testing.  Blinks to threat bilaterally. Cardiovascular: RRR, no  murmurs/rub/gallops. No Edema. Peripheral pulses 2+  Respiratory: CTAB.  No rales, wheezes.  On room air. Abdomen: + bowel sounds, normoactive. No distention or tenderness.  Skin: C/D/I. No apparent lesions. MSK:      ? Mild L torticollis      Strength:                RUE: 3 out of 5  proximally, 2 out of 5 distally                LUE: Absent finger abduction, wrist extension; otherwise 3 out of 5                RLE: 2/5 hip flexion, knee extension; 1/5 dorsiflexion, plantarflexion                LLE: 0/5 throughout with exception of 1 of 5 EHL  Neurologic exam:  Cognition: AAO to person, place; time as June Language: Extremely hypophonic, intelligibility less than 50%.  Speaking in mostly 1-2 word responses. Memory: Recalls 0/3 objects at 5 minutes. Insight: Poor insight into current condition.  Mood: Flat affect Sensation: ?  Decreased throughout left upper extremity and left lower extremity; recall to assess due to patient cognition Reflexes: Hyperreflexic in right upper and lower extremity compared to left upper and lower extremity. Negative Hoffman's and babinski signs bilaterally.  CN: Difficult to assess due to patient participation in exam; grossly intact Coordination: + Right upper extremity myoclonus Spasticity: MAS 2 in right triceps, biceps, finger flexors, knee extensors.  MAS 3 right hip flexor.     Results for orders placed or performed during the hospital encounter of 10/31/22 (from the past 24 hour(s))  Glucose, capillary     Status: Abnormal   Collection Time: 11/08/22  2:17 PM  Result Value Ref Range   Glucose-Capillary 136 (H) 70 - 99 mg/dL  Glucose, capillary     Status: Abnormal   Collection Time: 11/08/22  9:08 PM  Result Value Ref Range   Glucose-Capillary 108 (H) 70 - 99 mg/dL  Glucose, capillary     Status: Abnormal   Collection Time: 11/09/22  2:25 AM  Result Value Ref Range   Glucose-Capillary 101 (H) 70 - 99 mg/dL  Basic metabolic panel     Status:  Abnormal   Collection Time: 11/09/22  4:23 AM  Result Value Ref Range   Sodium 133 (L) 135 - 145 mmol/L   Potassium 3.1 (L) 3.5 - 5.1 mmol/L   Chloride 100 98 - 111 mmol/L   CO2 26 22 - 32 mmol/L   Glucose, Bld 155 (H) 70 - 99 mg/dL   BUN 24 (H) 6 - 20 mg/dL   Creatinine, Ser 1.61 0.44 - 1.00 mg/dL   Calcium 7.9 (L) 8.9 - 10.3 mg/dL   GFR, Estimated >09 >60 mL/min   Anion gap 7 5 - 15  Phosphorus     Status: None   Collection Time: 11/09/22  4:23 AM  Result Value Ref Range   Phosphorus 4.3 2.5 - 4.6 mg/dL  Magnesium     Status: None   Collection Time: 11/09/22  4:23 AM  Result Value Ref Range   Magnesium 2.0 1.7 - 2.4 mg/dL  Glucose, capillary     Status: None   Collection Time: 11/09/22  8:04 AM  Result Value Ref Range   Glucose-Capillary 84 70 - 99 mg/dL   No results found.  Assessment/Plan: Diagnosis: Bilateral corona radiata watershed infarcts Does the need for close, 24 hr/day medical supervision in concert with the patient's rehab needs make it unreasonable for this patient to be served in a less intensive setting? 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 Potentially Does the patient require coordinated care of a physician, rehab nurse, therapy disciplines of PT, OT, and SLP to address physical and functional deficits in the context of the above medical diagnosis(es)? Yes Addressing deficits in the following areas: balance, endurance, locomotion,  strength, transferring, bowel/bladder control, bathing, dressing, feeding, grooming, toileting, cognition, speech, language, swallowing, and psychosocial support Can the patient actively participate in an intensive therapy program of at least 3 hrs of therapy per day at least 5 days per week? No The potential for patient to make measurable gains while on inpatient rehab is fair Anticipated functional outcomes upon discharge from inpatient rehab are supervision and min assist  with PT, Min A to supervision with OT, min assist with SLP. Estimated rehab length of stay to reach the above functional goals is: 24-28 days Anticipated discharge destination: Home Overall Rehab/Functional Prognosis: fair  POST ACUTE RECOMMENDATIONS: This patient's condition is appropriate for continued rehabilitative care in the following setting: Currently SNF level, may progress to CIR level, see below Patient has agreed to participate in recommended program.  Unable to consent due to current cognitive status Note that insurance prior authorization may be required for reimbursement for recommended care.  Comment: Based on today's assessment, barriers to inpatient rehab would be lethargy limiting 3 hours/day therapy tolerance, and inability of caregivers to provide 24/7 supervision/assistance, which would be her anticipated functional level at discharge.  If unable to coordinate dispo plan with 24/7 caregivers, would recommend SNF.  If able to coordinate 24/7 care, would recommend inpatient rehab with a fair prognosis for functional gains.   MEDICAL RECOMMENDATIONS: Consider EEG for evaluation given ongoing myoclonus in right upper extremity and extreme lethargy  If no seizures, would initiate Ritalin 5 mg twice daily at 8 AM and 2 PM to improve daytime alertness, concentration, and memory and further assist participation in therapies Would track sleep-wake cycle, and if any concerns for insomnia, initiate trazodone 50 mg  nightly as needed and promote daytime arousal and participation   I have personally performed a face to face diagnostic evaluation of this patient. Additionally, I have examined the patient's medical record including any pertinent labs and radiographic images. If the physician assistant has documented in this note, I have reviewed and edited or otherwise concur with the physician assistant's documentation.  Thanks,  Angelina Sheriff, DO 11/09/2022

## 2022-11-09 NOTE — Progress Notes (Addendum)
    [facility] has been requested to perform a transesophageal echocardiogram on Danyiel J Yoho at the request of Dr. Thedore Mins with ID for evaluation of possible endocarditis with "multifocal cavitary pneumonia with MRSA, bilateral watershed distribution infarcts on MRI brain." Patient with very flat affect and almost inaudible voice on my visit today. When asked if she was in pain or her throat her, she said no. When asked why she could not speak up, she said it was "embarrassing." Unclear if this is a change from "muffled" voice documented earlier today. I did review the risks and benefits of transesophageal echocardiogram and explained them including risks of esophageal damage, perforation (1:10,000 risk), bleeding, pharyngeal hematoma as well as other potential complications associated with conscious sedation including aspiration, arrhythmia, respiratory failure and death. Alternatives to treatment were discussed, questions were answered.   Patient with limited verbal interaction was NOT willing to agree to TEE and requested more time to consider. Though answers limited, she was appropriate and correctly answered A&O questions. I will keep her NPO at midnight in case she makes an overnight decision given tight TEE scheduling.  Addendum: Patient's mother has provided emergency consent earlier in this admission and I did speak with her briefly on the phone tonight. She does not have POA so cannot decide for patient. I do think it would be valid to have her discuss with patient.   Perlie Gold PA-C 11/09/2022 4:09 PM

## 2022-11-09 NOTE — Progress Notes (Signed)
EEG complete - results pending 

## 2022-11-10 ENCOUNTER — Encounter (HOSPITAL_COMMUNITY)
Admission: EM | Disposition: A | Discharge status: Rehabilitation Facility | Payer: Self-pay | Source: Home / Self Care | Attending: Internal Medicine

## 2022-11-10 ENCOUNTER — Other Ambulatory Visit (HOSPITAL_COMMUNITY): Payer: Medicaid Other

## 2022-11-10 DIAGNOSIS — R4182 Altered mental status, unspecified: Secondary | ICD-10-CM | POA: Diagnosis not present

## 2022-11-10 DIAGNOSIS — R569 Unspecified convulsions: Secondary | ICD-10-CM | POA: Diagnosis not present

## 2022-11-10 DIAGNOSIS — J9601 Acute respiratory failure with hypoxia: Secondary | ICD-10-CM | POA: Diagnosis not present

## 2022-11-10 LAB — BASIC METABOLIC PANEL
Anion gap: 6 (ref 5–15)
BUN: 24 mg/dL — ABNORMAL HIGH (ref 6–20)
CO2: 26 mmol/L (ref 22–32)
Calcium: 8.2 mg/dL — ABNORMAL LOW (ref 8.9–10.3)
Chloride: 106 mmol/L (ref 98–111)
Creatinine, Ser: 0.87 mg/dL (ref 0.44–1.00)
GFR, Estimated: >60 mL/min (ref >60)
Glucose, Bld: 109 mg/dL — ABNORMAL HIGH (ref 70–99)
Potassium: 4.1 mmol/L (ref 3.5–5.1)
Sodium: 138 mmol/L (ref 135–145)

## 2022-11-10 LAB — MAGNESIUM: Magnesium: 2 mg/dL (ref 1.7–2.4)

## 2022-11-10 LAB — PREGNANCY, URINE: Preg Test, Ur: NEGATIVE

## 2022-11-10 SURGERY — ECHOCARDIOGRAM, TRANSESOPHAGEAL
Anesthesia: Monitor Anesthesia Care

## 2022-11-10 MED ORDER — SODIUM CHLORIDE 0.9 % IV SOLN: INTRAVENOUS | Status: DC

## 2022-11-10 MED ORDER — VANCOMYCIN HCL 500 MG IV SOLR
500.0000 mg | Freq: Two times a day (BID) | INTRAVENOUS | Status: DC
  Administered 2022-11-10: 500 mg via INTRAVENOUS
  Filled 2022-11-10 (×4): qty 10

## 2022-11-10 NOTE — Procedures (Signed)
Patient Name: AJAE STFLEUR  MRN: [id number]  Epilepsy Attending: Charlsie Quest  Referring Physician/Provider: Rolly Salter, MD  Date: 11/09/2022 Duration: 25 mins  Patient history:  47 y.o. female with remote history of polysubstance use who presented after being found down and unresponsive by a bystander. EEG to evaluate for seizure.   Level of alertness: Awake  AEDs during EEG study: None  Technical aspects: This EEG study was done with scalp electrodes positioned according to the 10-20 International system of electrode placement. Electrical activity was reviewed with band pass filter of 1-70Hz , sensitivity of 7 uV/mm, display speed of 5mm/sec with a 60Hz  notched filter applied as appropriate. EEG data were recorded continuously and digitally stored.  Video monitoring was available and reviewed as appropriate.  Description: The posterior dominant rhythm consists of 7 Hz activity of moderate voltage (25-35 uV) seen predominantly in posterior head regions, symmetric and reactive to eye opening and eye closing. EEG showed continuous generalized 5 to 7 Hz theta slowing.  On 11/09/2022 at 1654, patient was noted to have episodes of right hand jerking. Concomitant EEG before, during and after the event did not show any EEG changes suggest seizure.  Hyperventilation and photic stimulation were not performed.     ABNORMALITY - Continuous slow, generalized - Background slow  IMPRESSION: This study is suggestive of moderate diffuse encephalopathy, nonspecific etiology. No seizures or epileptiform discharges were seen throughout the recording.  Aubrie Lucien Annabelle Harman

## 2022-11-10 NOTE — SANE Note (Signed)
At approximately 12 noon I was called about an FNE Consult on this pt.  The Charge RN, Vikki Ports, asked, "Is it OK if we get a cath urine on the pt? Her purewick not staying in, and there is a urine pregnancy ordered for her, but we don't know if we can do that, or if you need to see her first."  After reviewing the pt's record, it was noted that Nicole [name] had resolved the issue with this pt last week, and that [facility] had decided that the pt did not need a forensic nursing exam. The charge RN was notified that the consult had already been done, and that we no longer needed to see the pt - unless the pt was requesting to speak to [place].  The RN reported that the pt was still unresponsive and not requesting our services.  The consult was cancelled by this RN in EPIC.

## 2022-11-10 NOTE — Progress Notes (Signed)
Pharmacy Antibiotic Note  Nicole [name] is a 47 y.o. female admitted on 10/31/2022 with MRSA PNA, some concern for CNS component - will dose to cover both for now. Pharmacy has been consulted for Vancomycin dosing.  SCr 0.87, CrCl~60 ml/min. Will target trough 15-20 and AUC 400-550  Plan: - Start Vancomycin 500 mg IV every 12 hours (eAUC 494, VT 15, SCr 0.87) - Will continue to follow renal function, culture results, LOT, and antibiotic de-escalation plans   Height: 5\' 1"  (154.9 cm) Weight: 50.6 kg (111 lb 8.8 oz) IBW/kg (Calculated) : 47.8  Temp (24hrs), Avg:99.1 F (37.3 C), Min:98.6 F (37 C), Max:99.7 F (37.6 C)  Recent Labs  Lab 11/04/22 0416 11/05/22 0430 11/06/22 0540 11/07/22 0110 11/08/22 0315 11/09/22 0423 11/10/22 0359  WBC 31.2* 23.0* 15.2*  --   --   --   --   CREATININE 0.71 0.75 0.71 0.78 0.87 0.75 0.87    Estimated Creatinine Clearance: 61.6 mL/min (by C-G formula based on SCr of 0.87 mg/dL).    Not on File  Antimicrobials this admission: Vancomycin 6/18 x 1; restart 6/28 Cefepime 6/18 >> 6/19 Flagyl 6/18 >> 6/19 Doxy 6/19 >> 6/22 Unasyn 6/19 >> 6/22 Linezolid 6/22 >> 6/27  Dose adjustments this admission:   Microbiology results: 6/18 BCx >> neg 6/20 RCx (TA) >> MRSA  Thank you for allowing pharmacy to be a part of this patient's care.  Georgina Pillion, PharmD, BCPS, BCIDP Infectious Diseases Clinical Pharmacist 11/10/2022 2:13 PM   **Pharmacist phone directory can now be found on [email or web address] ([email or web address]).  Listed under [facility].

## 2022-11-10 NOTE — Plan of Care (Signed)

## 2022-11-10 NOTE — Progress Notes (Signed)
Occupational Therapy Treatment Patient Details Name: Nicole [name] MRN: [id number] DOB: 02/16/1976 Today's Date: 11/10/2022   History of present illness 47 y.o. female admitted 6/18 after being found down and unresponsive. She was brought in by EMS and intubated in ED (ETT 6/18-23). Dx includes bilateral pneumonia right greater than left, severe dehydration and hypernatremia, AKI, and concern for possible anoxic injury. MRI head 6/18: "Symmetric signal abnormality within the globi pallidi is most  consistent with a toxic metabolic injury, such as carbon monoxide  poisoning. 2. 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  3. Small amount of bifrontal SAH". PMH includes remote history of drug abuse.   OT comments  Patient continues to demonstrate progress with OT treatment with max assist of one to get to EOB. Patient attempting to assist with getting to EOB with patient requiring assistance with BLEs and trunk. Patient able to sit on EOB for 15 minutes with mod to max assist and assistance with head control. WB on elbows and lateral flexion exercises performed to address posture and balance. Patient will benefit from intensive inpatient follow up therapy, >3 hours/day to continue to address bathing, dressing, and toilet transfers. Acute OT to continue to follow.     Recommendations for follow up therapy are one component of a multi-disciplinary discharge planning process, led by the attending physician.  Recommendations may be updated based on patient status, additional functional criteria and insurance authorization.    Assistance Recommended at Discharge Frequent or constant Supervision/Assistance  Patient can return home with the following  Two people to help with walking and/or transfers;Two people to help with bathing/dressing/bathroom;Assistance with cooking/housework;Direct supervision/assist for medications management;Direct supervision/assist for financial management;Assist for  transportation;Help with stairs or ramp for entrance   Equipment Recommendations  Other (comment) (defer)    Recommendations for Other Services Rehab consult    Precautions / Restrictions Precautions Precautions: Fall Precaution Comments: contact precautions Restrictions Weight Bearing Restrictions: No       Mobility Bed Mobility Overal bed mobility: Needs Assistance Bed Mobility: Supine to Sit, Sit to Supine     Supine to sit: Max assist Sit to supine: Total assist   General bed mobility comments: required assistance to move BLEs off EOB and to raise trunk. Total assist to return to supine    Transfers                   General transfer comment: not attempted     Balance Overall balance assessment: Needs assistance Sitting-balance support: Bilateral upper extremity supported, Feet supported Sitting balance-Leahy Scale: Poor Sitting balance - Comments: sitting EOB with mod to max assist Postural control: Posterior lean                                 ADL either performed or assessed with clinical judgement   ADL Overall ADL's : Needs assistance/impaired     Grooming: Moderate assistance;Sitting Grooming Details (indicate cue type and reason): HOH assisted needed to wash face with LUE                               General ADL Comments: Limited secondary to cognition, BUE weakness, balance    Extremity/Trunk Assessment              Vision       Perception     Praxis  Cognition Arousal/Alertness: Awake/alert Behavior During Therapy: Flat affect Overall Cognitive Status: Impaired/Different from baseline Area of Impairment: Orientation, Attention, Following commands, Safety/judgement, Problem solving, Awareness                 Orientation Level: Disoriented to, Time, Situation (provided month and year not day of week) Current Attention Level: Focused Memory: Decreased short-term memory Following  Commands: Follows one step commands with increased time Safety/Judgement: Decreased awareness of safety, Decreased awareness of deficits Awareness: Intellectual Problem Solving: Slow processing, Decreased initiation, Difficulty sequencing, Requires verbal cues, Requires tactile cues General Comments: frequent cueing to follow directions, ablet to give month and year not day of week.        Exercises Exercises: General Upper Extremity General Exercises - Upper Extremity Shoulder Flexion: PROM, AAROM, Right, Left, 10 reps, Supine    Shoulder Instructions       General Comments      Pertinent Vitals/ Pain       Pain Assessment Pain Assessment: Faces Faces Pain Scale: Hurts a little bit Pain Location: neck pain when positioning Pain Descriptors / Indicators: Discomfort, Grimacing Pain Intervention(s): Limited activity within patient's tolerance, Monitored during session, Repositioned  Home Living                                          Prior Functioning/Environment              Frequency  Min 2X/week        Progress Toward Goals  OT Goals(current goals can now be found in the care plan section)  Progress towards OT goals: Progressing toward goals  Acute Rehab OT Goals Patient Stated Goal: none stated OT Goal Formulation: With patient Time For Goal Achievement: 11/20/22 Potential to Achieve Goals: Good ADL Goals Pt Will Perform Lower Body Dressing: with mod assist;sit to/from stand Pt Will Transfer to Toilet: with mod assist;bedside commode;stand pivot transfer Additional ADL Goal #1: Pt will utilize LUE as a functional assist during ADL tasks Additional ADL Goal #2: Pt will sequence 2-step ADL task with min cues  Plan Discharge plan remains appropriate;Frequency remains appropriate    Co-evaluation                 AM-PAC OT "6 Clicks" Daily Activity     Outcome Measure   Help from another person eating meals?: A Lot Help from  another person taking care of personal grooming?: A Lot Help from another person toileting, which includes using toliet, bedpan, or urinal?: Total Help from another person bathing (including washing, rinsing, drying)?: Total Help from another person to put on and taking off regular upper body clothing?: Total Help from another person to put on and taking off regular lower body clothing?: Total 6 Click Score: 8    End of Session    OT Visit Diagnosis: Unsteadiness on feet (R26.81);Muscle weakness (generalized) (M62.81);Other symptoms and signs involving cognitive function   Activity Tolerance Patient tolerated treatment well   Patient Left in bed;with call bell/phone within reach;with bed alarm set;with family/visitor present   Nurse Communication Mobility status        Time: 1610-9604 OT Time Calculation (min): 24 min  Charges: OT General Charges $OT Visit: 1 Visit OT Treatments $Self Care/Home Management : 8-22 mins $Therapeutic Activity: 8-22 mins  Alfonse Flavors, OTA Acute Rehabilitation Services  Office [phone]   Dewain Penning 11/10/2022, 12:35  PM

## 2022-11-10 NOTE — Progress Notes (Signed)
Inpatient Rehab Coordinator Note:  I met with patient and her mother, [name], at bedside to discuss CIR recommendations and goals/expectations of CIR stay.  We reviewed 3 hrs/day of therapy, physician follow up, and average length of stay 2 weeks (dependent upon progress) with goals of supervision to min assist.  [name] states she works PT, but could arrange to be home with pt 24/7.  I reiterated that she will most certainly need 24/7 supervision at discharge from a CIR stay and [name] agrees.  We discussed Medicaid benefits and ongoing workup in the hospital setting, which will need to be completed prior to admit to CIR.  Pt participating well with OT today, and was able to follow commands and roll/position in bed with RN and I providing +1 assist.  We will continue to follow for potential admit.   Estill Dooms, PT, DPT Admissions Coordinator [phone] 11/10/22  4:57 PM

## 2022-11-10 NOTE — PMR Pre-admission (Signed)
PMR Admission Coordinator Pre-Admission Assessment  Patient: Nicole [name] is an 47 y.o., female MRN: [id number] DOB: 12/02/1975 Height: 5\' 1"  (154.9 cm) Weight: 50.6 kg              Insurance Information HMO:     PPO:      PCP:      IPA:      80/20:      OTHER:  PRIMARY: [place] [employer]     Policy#: [id number]      Subscriber: pt CM Name: Nicole [name]       Phone#:      Fax#: [phone] Pre-Cert#: [id number] auth for CIR faxed from [facility] with [place] [employer] for admit 7/5 with updates due 7/11 (7 days) to fax listed above      Employer:  Benefits:  Phone #:   [phone]   Name:  Eff. Date:      Deduct:       Out of Pocket Max:       Life Max:   CIR:   100%   SNF:  Outpatient:      Co-Pay:  Home Health:       Co-Pay:  DME:      Co-Pay:  Providers:  SECONDARY:       Policy#:       Phone#:   Artist:       Phone#:   The Data processing manager" for patients in Inpatient Rehabilitation Facilities with attached "Privacy Act Statement-Health Care Records" was provided and verbally reviewed with: N/A  Emergency Contact Information Contact Information     Name Relation Home Work Mobile   [name],[name] Mother   [phone]      Current Medical History  Patient Admitting Diagnosis: hypoxic brain injury  History of Present Illness: Pt is a 47 y/o female with PMH of polysubstance abuse admitted to [facility] on 10/31/22 after being found down unresponsive.  EMS reports pt was hypoxic on their arrival, and remained so on 15L NRB, and tachycardic.  She was emergently intubated.  WBC 43.6 on admit, serum osmolality 379, sodium 164, potassium 3.1, creatinine 2.5.  Chest xray showed right basilar airspace disease.  CT head showed symmetric edema within bilateral globus labia concerning for hypoxic injury.  CT abd/pelvic R>L lobe consolidation and nodular groundglass opacities with lucency in the R lower lobe raising concern for cavitary  infection.  UDS cocaine.  F/u MRI showed bilateral globus pallidus injury consistent with hypoxic injury, as well as bilateral watershed distribution infarcts.  Sedation was weaned and pt extubated on 6/23.  .  ID consulted and recommended linezolid and agreed with TEE for assessment given multifocal cavitary PNA with MRSA.  Cardiology consulted for TEE to assess for vegetation given concern for septic emboli to lungs and possibly brain.  If TEE shows vegetation will complete 6 weeks of IV therapy, and if TEE negative then 2 weeks followed by 2-4 weeks of doxy.  Therapy ongoing and pt was recommended for CIR.   Complete NIHSS TOTAL: 19 Glasgow Coma Scale Score: 13  Patient's medical record from [facility] has been reviewed by the rehabilitation admission coordinator and physician.  Past Medical History  History reviewed. No pertinent past medical history.  Has the patient had major surgery during 100 days prior to admission? No  Family History  family history is not on file.   Current Medications   Current Facility-Administered Medications:    0.9 %  sodium chloride infusion, ,  Intravenous, PRN, Rai, Ripudeep K, MD, Stopped at 11/05/22 1633   0.9 %  sodium chloride infusion, , Intravenous, Continuous, [place], [place], [place]   acetaminophen (TYLENOL) tablet 650 mg, 650 mg, Oral, Q6H PRN, Rai, Ripudeep K, MD, 650 mg at 11/08/22 2304   Chlorhexidine Gluconate Cloth 2 % PADS 6 each, 6 each, Topical, Q0600, Rai, Ripudeep K, MD, 6 each at 11/10/22 0542   enoxaparin (LOVENOX) injection 40 mg, 40 mg, Subcutaneous, Q24H, Rai, Ripudeep K, MD, 40 mg at 11/10/22 1529   feeding supplement (ENSURE ENLIVE / ENSURE PLUS) liquid 237 mL, 237 mL, Oral, BID BM, Rai, Ripudeep K, MD, 237 mL at 11/09/22 2009   ibuprofen (ADVIL) tablet 200 mg, 200 mg, Oral, Q6H PRN, Rai, Ripudeep K, MD, 200 mg at 11/08/22 1346   leptospermum manuka honey (MEDIHONEY) paste 1 Application, 1 Application, Topical, Daily, Rai, Ripudeep K, MD, 1  Application at 11/10/22 1123   multivitamin with minerals tablet 1 tablet, 1 tablet, Oral, Daily, Rai, Ripudeep K, MD, 1 tablet at 11/09/22 1236   ondansetron (ZOFRAN) injection 4 mg, 4 mg, Intravenous, Q6H PRN, Rolly Salter, MD   Oral care mouth rinse, 15 mL, Mouth Rinse, PRN, Rai, Ripudeep K, MD   pentafluoroprop-tetrafluoroeth (GEBAUERS) aerosol 1 Application, 1 Application, Topical, QID PRN, Rai, Ripudeep K, MD, 1 Application at 11/02/22 0818   polyethylene glycol (MIRALAX / GLYCOLAX) packet 17 g, 17 g, Oral, Daily PRN, Rai, Ripudeep K, MD   sodium chloride flush (NS) 0.9 % injection 10-40 mL, 10-40 mL, Intracatheter, Q12H, Rai, Ripudeep K, MD, 10 mL at 11/10/22 1129   sodium chloride flush (NS) 0.9 % injection 10-40 mL, 10-40 mL, Intracatheter, PRN, Rai, Ripudeep K, MD   vancomycin (VANCOCIN) 500 mg in sodium chloride 0.9 % 100 mL IVPB, 500 mg, Intravenous, Q12H, Rexford Maus, RPH  Patients Current Diet:  Diet Order     None       Precautions / Restrictions Precautions Precautions: Fall Precaution Comments: contact precautions Restrictions Weight Bearing Restrictions: No   Has the patient had 2 or more falls or a fall with injury in the past year?No  Prior Activity Level Community (5-7x/wk): indep, no DME, not driving, not working  Prior Functional Level Prior Function Prior Level of Function : Independent/Modified Independent, Patient poor historian/Family not available Mobility Comments: per pt and mother, pt is independent with mobility PTA. [name] states pt would be gone for up to a week at a time ADLs Comments: pt reports independence  Self Care: Did the patient need help bathing, dressing, using the toilet or eating?  Independent  Indoor Mobility: Did the patient need assistance with walking from room to room (with or without device)? Independent  Stairs: Did the patient need assistance with internal or external stairs (with or without device)?  Independent  Functional Cognition: Did the patient need help planning regular tasks such as shopping or remembering to take medications? Independent  Patient Information Are you of Hispanic, Latino/a,or Spanish origin?: A. No, not of Hispanic, Latino/a, or Spanish origin What is your race?: A. White Do you need or want an interpreter to communicate with a doctor or health care staff?: 0. No  Patient's Response To:  Health Literacy and Transportation Is the patient able to respond to health literacy and transportation needs?: Yes Health Literacy - How often do you need to have someone help you when you read instructions, pamphlets, or other written material from your doctor or pharmacy?: Never In the past 12  months, has lack of transportation kept you from medical appointments or from getting medications?: No In the past 12 months, has lack of transportation kept you from meetings, work, or from getting things needed for daily living?: No  Home Assistive Devices / Equipment Home Equipment: Gilmer Mor - single point  Prior Device Use: Indicate devices/aids used by the patient prior to current illness, exacerbation or injury? None of the above  Current Functional Level Cognition  Arousal/Alertness: Awake/alert Overall Cognitive Status: Impaired/Different from baseline Current Attention Level: Focused Orientation Level: Oriented to person Following Commands: Follows one step commands with increased time Safety/Judgement: Decreased awareness of safety, Decreased awareness of deficits General Comments: frequent cueing to follow directions, ablet to give month and year not day of week. Attention: Focused, Sustained Focused Attention: Appears intact Sustained Attention: Impaired Sustained Attention Impairment: Verbal complex Memory: Impaired Memory Impairment:  (Immediate: 5/5; delayed: 0/5; with cues: 0/5) Awareness: Impaired Awareness Impairment: Intellectual impairment    Extremity  Assessment (includes Sensation/Coordination)  Upper Extremity Assessment: RUE deficits/detail, LUE deficits/detail RUE Deficits / Details: Difficult to assess 2/2 cognition. Decreased AROM noted this session. Hand tremor noted at rest. LUE Deficits / Details: Difficult to assess 2/2 cognition. Minimal spontaneous or volitional movement this session. Some digit flexion noted with functional task. LUE Coordination: decreased fine motor, decreased gross motor  Lower Extremity Assessment: Defer to PT evaluation RLE Deficits / Details: 2/5 knee extension, other MMT limited by pt command following LLE Deficits / Details: no active movement observed or appreciated during atempted MMT, able to stand without knee blocking so anticipate pt has 3/5 knee extensor strength.    ADLs  Overall ADL's : Needs assistance/impaired Eating/Feeding: Moderate assistance, Sitting Grooming: Moderate assistance, Sitting Grooming Details (indicate cue type and reason): HOH assisted needed to wash face with LUE Upper Body Bathing: Moderate assistance, Sitting Lower Body Bathing: Maximal assistance, +2 for physical assistance, +2 for safety/equipment, Sit to/from stand Upper Body Dressing : Total assistance, Sitting Upper Body Dressing Details (indicate cue type and reason): doffed/donned hospital gown with total A, max cues for initiation and HOH Lower Body Dressing: Total assistance, Bed level Lower Body Dressing Details (indicate cue type and reason): total A to don bilat socks at bed level Toilet Transfer: Maximal assistance, Stand-pivot, BSC/3in1 Toilet Transfer Details (indicate cue type and reason): simulated Toileting- Clothing Manipulation and Hygiene: Total assistance Toileting - Clothing Manipulation Details (indicate cue type and reason): total A for posterior hygiene in standing post bowel incontinence. Functional mobility during ADLs: Maximal assistance General ADL Comments: Limited secondary to cognition,  BUE weakness, balance    Mobility  Overal bed mobility: Needs Assistance Bed Mobility: Supine to Sit, Sit to Supine Rolling: Total assist Supine to sit: Max assist Sit to supine: Total assist General bed mobility comments: required assistance to move BLEs off EOB and to raise trunk. Total assist to return to supine    Transfers  Overall transfer level: Needs assistance Equipment used: 2 person hand held assist Transfers: Sit to/from Stand Sit to Stand: Max assist, +2 physical assistance, +2 safety/equipment General transfer comment: not attempted    Ambulation / Gait / Stairs / Wheelchair Mobility  Ambulation/Gait General Gait Details: unable this session    Posture / Balance Dynamic Sitting Balance Sitting balance - Comments: sitting EOB with mod to max assist Balance Overall balance assessment: Needs assistance Sitting-balance support: Bilateral upper extremity supported, Feet supported Sitting balance-Leahy Scale: Poor Sitting balance - Comments: sitting EOB with mod to max assist Postural  control: Posterior lean Standing balance support: Bilateral upper extremity supported Standing balance-Leahy Scale: Poor Standing balance comment: Max A to maintain standing    Special needs/care consideration Behavioral consideration BI     Previous Home Environment (from acute therapy documentation) Living Arrangements: Parent (mother)  Lives With: Alone Available Help at Discharge: Family, Other (Comment) (pt's mother works x2 days a week) Type of Home: House Home Layout: One level Home Access: Level entry [employer] Shower/Tub: Health visitor: Standard Additional Comments: Currently lives with mom who works a couple days per week; home setup info gathered from [employer] mom  Discharge Living Setting Plans for Discharge Living Setting: Lives with (comment) (mom, [name]) Type of Home at Discharge: House Discharge Home Layout: One level Discharge Home Access: Level  entry Discharge Bathroom Shower/Tub: Walk-in shower Discharge Bathroom Toilet: Standard Discharge Bathroom Accessibility: Yes How Accessible: Accessible via walker  Social/Family/Support Systems Anticipated Caregiver: mom, Vickki Muff Anticipated Caregiver's Contact Information: [phone] Ability/Limitations of Caregiver: min assist at most Caregiver Availability: 24/7 Discharge Plan Discussed with Primary Caregiver: Yes Is Caregiver In Agreement with Plan?: Yes Does Caregiver/Family have Issues with Lodging/Transportation while Pt is in Rehab?: No   Goals Patient/Family Goal for Rehab: PT/OT/SLP supervision to min assist Expected length of stay: 28-30 days Additional Information: Discharge plan: home with mom, [name], who is planning to leave her PT job to care for pt 24/7.  She can provide up to min assist for mobility and ADLs Pt/Family Agrees to Admission and willing to participate: Yes Program Orientation Provided & Reviewed with Pt/Caregiver Including Roles  & Responsibilities: Yes  Barriers to Discharge: Insurance for SNF coverage   Decrease burden of Care through IP rehab admission: n/a   Possible need for SNF placement upon discharge:Not anticipated.  Plan for discharge home with mom, [name], providing 24/7 assist at discharge.  Mom can leave her job to provide min assist.    Patient Condition: This patient's medical and functional status has changed since the consult dated: 11/09/22 in which the Rehabilitation Physician determined and documented that the patient's condition is appropriate for intensive rehabilitative care in an inpatient rehabilitation facility. See "History of Present Illness" (above) for medical update. Functional changes are: pt is max assist 1-2. Patient's medical and functional status update has been discussed with the Rehabilitation physician and patient remains appropriate for inpatient rehabilitation. Will admit to inpatient rehab  today.  Preadmission Screen Completed By:  Stephania Fragmin, PT, DPT 11/10/2022 5:01 PM ______________________________________________________________________   Discussed status with Dr. Wynn Banker on 11/17/22 at 2:29 PM  and received approval for admission today.  Admission Coordinator:  Stephania Fragmin, PT, DPT time 2:29 PM Dorna Bloom 11/17/22

## 2022-11-10 NOTE — Progress Notes (Signed)
[facility] Progress Note Patient: Nicole [name] VHQ:[id number] DOB: 07/11/1975 DOA: 10/31/2022  DOS: the patient was seen and examined on 11/10/2022  Brief hospital course: Patient is a 47 year old female with polysubstance abuse, was found unresponsive by her by bystander next-door condemned building.  EMS was notified.  She was hypoxic on EMS arrival, requiring 15 L NRB.  In ED, she was unresponsive, tachypneic, diaphoretic and critically ill, WBCs 43.6, serum osmolality 379, sodium 164, K3.1, creatinine 2.5.  She was intubated for airway protection and hypoxic respiratory failure. Chest x-ray showed right basilar airspace disease.  CT head showed symmetric edema within bilateral globus palladi concerning for ischemic/hypoxic injury. CT chest abdomen pelvis showed right greater than left lower lobe consolidation, nodular groundglass opacities with lucency in the right lower lobe concerning for cavitary infection. Patient was admitted to ICU, started on IV vancomycin and cefepime.   Of note, mother reported no history of major medical illnesses. 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    Significant Hospital Events:  6/18 admitted after being found unresponsive and hypoxic with bilateral pneumonia right greater than left, severe dehydration and hypernatremia, AKI, and concern for possible anoxic injury intubated by EDP antibiotics initiated.  She was unresponsive postintubation and during intubation process 6/19 MRI shows bilateral globus pallidus injury 6/22 sedation now following commands predominantly on the right side. 6/23: Extubated 6/25: Transferred to telemetry level, TRH assumed care  Assessment and Plan: Acute respiratory failure with hypoxia, bilateral pneumonia, cavitary RLL PNA likely aspiration PNA, MRSA pneumonia  -Extubated on 6/23, currently stable on room  air -Respiratory cultures positive for MRSA, blood cultures negative so far -ID following, Unasyn DC'd on 6/24, continue linezolid -2D echo showed no valvular vegetation. -CT chest 6/22 showed interval progression of RUL LLL consolidative opacity with progression of cavitary component now measuring 3.2 cm with layering fluid level compatible with cavitary pneumonia, patchy nodular left lower lobe airspace disease. -Per pulmonology, lobar pneumonia with necrotizing component/abscess, recommended at least 2 weeks of antibiotics, needs follow-up checks x-ray in 8 to 10 weeks with consideration of CT if continues to be abnormal TEE requested.  Originally scheduled for 6/28.  Anesthesia had concerns with regards to her recent intubation requirement and therefore recommend to postpone the TEE.  Discussed with ID as well as cardiology currently both agreeable.  Will monitor. In the meantime patient will be on IV antibiotics.   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  -Patient was seen by neurosurgery on 6/18, no acute neurosurgical intervention recommended, supportive care, frequent exams, poor prognosis -2D echo showed EF of 60 to 65%, no regional WMA, normal diastolic parameters, RV systolic function normal, no vegetations Appreciate neurology evaluation.  Recommend supportive care. CIR has recommended EEG.  EEG was ordered and negative for any active seizures. Neurology has evaluated the patient on 6/25.   Substance use, cocaine abuse UDS on admission positive for cocaine, alcohol level less than 10, salicylate  less than 7.0   Severe sepsis with lactic acidosis, due to pneumonia Patient met SIRS criteria on admission with tachypnea hypotension, hypothermia and  leukocytosis with source of infection being MRSA pneumonia. Required ventilator for respiratory failure as organ damage. Did not require pressors.  No evidence of shock. Sepsis physiology has resolved.   AKI with multiple electrolyte abnormalities, anion gap metabolic acidosis Admission serum creatinine 2.57.  Currently less than 1.  No prior baseline available but most likely baseline is normal based on current creatinine. Most likely combination of hypovolemia and ATN. Monitor for now.   hypernatremia On admission serum sodium 165.  Improved with IV hydration.   Hypokalemia hypocalcemia Improved, replace as needed   Mild transaminitis with rhabdomyolysis CK peaked at 900. LFTs peaked at 100.  Both improving.   Left buttock unstageable ulcer, present on admission.  Continue foam dressing.   Subjective: No complaint.  No nausea vomiting fever no chills.  Reports of pain at the IV site.Marland Kitchen  Physical Exam: In mild to transfer No rash. S1-S2 present. Clear to auscultation. Bowel sound present. Still has muffled voice.  Data Reviewed: I have Reviewed nursing notes, Vitals, and Lab results. Discussed with ID, cardiology. Reviewed CBC and BMP.  Reordered CBC and BMP.  Disposition: Status is: Inpatient Remains inpatient appropriate because: Awaiting CIR placement.  Need further workup.  enoxaparin (LOVENOX) injection 40 mg Start: 11/01/22 1400 SCDs Start: 10/31/22 1802   Family Communication: No one at bedside Level of care: Telemetry Medical   Vitals:   11/09/22 0805 11/09/22 2006 11/10/22 0528 11/10/22 0752  BP: 112/69 113/66 100/69 115/76  Pulse: 93 99 79 (!) 101  Resp: 18 17 17 18   Temp: 98.6 F (37 C) 99.7 F (37.6 C) 99 F (37.2 C) 98.6 F (37 C)  TempSrc:  Oral Oral   SpO2: 95% 96% 99% 94%  Weight:       Height:         Author: Lynden Oxford, MD 11/10/2022 3:58 PM  Please look on [email or web address] to find out who is on call.

## 2022-11-10 NOTE — Progress Notes (Signed)
   [facility] has been requested to perform a transesophageal echocardiogram on Nicole [name] for transesophageal echocardiogram.  After careful review of history and examination, the risks and benefits of transesophageal echocardiogram have been explained including risks of esophageal damage, perforation (1:10,000 risk), bleeding, pharyngeal hematoma as well as other potential complications associated with anesthesia including aspiration, arrhythmia, respiratory failure and death. Alternatives to treatment were discussed, questions were answered. Patient is willing to proceed.   Spoke with the patient this morning regarding the benefit and risk of transesophageal echocardiogram.  She continues to whispers.  I explained benefit and risk of the transesophageal echocardiogram, I also confirmed the patient was alert and oriented x 4 with her name, who is the president of [place], year and her location.  She said yes when asked her if she is willing to proceed with transesophageal echocardiogram.  I was able to verify if she is truly consented, therefore I asked her to nod her head if she is willing to go through with this procedure and shake her head if she does not.  She was able to nod her head to confirm she is wanting to proceed with TEE.  I asked her a third time verbally to see if she is still willing to go through with this procedure and she says yes.  Since she is able to make her own medical decision at this time, we will leave her on the board for transesophageal echocardiogram this afternoon.  [place], [place] 11/10/2022 8:01 AM

## 2022-11-11 DIAGNOSIS — D72829 Elevated white blood cell count, unspecified: Secondary | ICD-10-CM

## 2022-11-11 DIAGNOSIS — F1911 Other psychoactive substance abuse, in remission: Secondary | ICD-10-CM

## 2022-11-11 DIAGNOSIS — B9561 Methicillin susceptible Staphylococcus aureus infection as the cause of diseases classified elsewhere: Secondary | ICD-10-CM | POA: Diagnosis not present

## 2022-11-11 DIAGNOSIS — R4182 Altered mental status, unspecified: Secondary | ICD-10-CM | POA: Diagnosis not present

## 2022-11-11 DIAGNOSIS — J9601 Acute respiratory failure with hypoxia: Secondary | ICD-10-CM | POA: Diagnosis not present

## 2022-11-11 LAB — BASIC METABOLIC PANEL
Anion gap: 8 (ref 5–15)
BUN: 26 mg/dL — ABNORMAL HIGH (ref 6–20)
CO2: 24 mmol/L (ref 22–32)
Calcium: 8.2 mg/dL — ABNORMAL LOW (ref 8.9–10.3)
Chloride: 101 mmol/L (ref 98–111)
Creatinine, Ser: 0.71 mg/dL (ref 0.44–1.00)
GFR, Estimated: >60 mL/min (ref >60)
Glucose, Bld: 88 mg/dL (ref 70–99)
Potassium: 4 mmol/L (ref 3.5–5.1)
Sodium: 133 mmol/L — ABNORMAL LOW (ref 135–145)

## 2022-11-11 LAB — GLUCOSE, CAPILLARY: Glucose-Capillary: 114 mg/dL — ABNORMAL HIGH (ref 70–99)

## 2022-11-11 LAB — MAGNESIUM: Magnesium: 2 mg/dL (ref 1.7–2.4)

## 2022-11-11 MED ORDER — SODIUM CHLORIDE 0.9 % IV SOLN: INTRAVENOUS | Status: AC

## 2022-11-11 MED ORDER — VANCOMYCIN HCL 1.25 G IV SOLR
1250.0000 mg | Freq: Once | INTRAVENOUS | Status: AC
  Administered 2022-11-11: 1250 mg via INTRAVENOUS
  Filled 2022-11-11: qty 25

## 2022-11-11 MED ORDER — VANCOMYCIN HCL 500 MG IV SOLR
500.0000 mg | Freq: Two times a day (BID) | INTRAVENOUS | Status: DC
  Administered 2022-11-12 – 2022-11-14 (×5): 500 mg via INTRAVENOUS
  Filled 2022-11-11 (×7): qty 10

## 2022-11-11 NOTE — Progress Notes (Signed)
[facility] Progress Note Patient: Nicole [name] ZOX:[id number] DOB: [birth year] DOA: 10/31/2022  DOS: the patient was seen and examined on 11/11/2022  Brief hospital course: Patient is a 47 year old female with polysubstance abuse, was found unresponsive by her by bystander next-door condemned building.  EMS was notified.  She was hypoxic on EMS arrival, requiring 15 L NRB.  In ED, she was unresponsive, tachypneic, diaphoretic and critically ill, WBCs 43.6, serum osmolality 379, sodium 164, K3.1, creatinine 2.5.  She was intubated for airway protection and hypoxic respiratory failure. Chest x-ray showed right basilar airspace disease.  CT head showed symmetric edema within bilateral globus palladi concerning for ischemic/hypoxic injury. CT chest abdomen pelvis showed right greater than left lower lobe consolidation, nodular groundglass opacities with lucency in the right lower lobe concerning for cavitary infection. Patient was admitted to ICU, started on IV vancomycin and cefepime.   Of note, mother reported no history of major medical illnesses. 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    Significant Hospital Events:  6/18 admitted after being found unresponsive and hypoxic with bilateral pneumonia right greater than left, severe dehydration and hypernatremia, AKI, and concern for possible anoxic injury intubated by EDP antibiotics initiated.  She was unresponsive postintubation and during intubation process 6/19 MRI shows bilateral globus pallidus injury 6/22 sedation now following commands predominantly on the right side. 6/23: Extubated 6/25: Transferred to telemetry level, TRH assumed care  Assessment and Plan: Acute respiratory failure with hypoxia, bilateral pneumonia, cavitary RLL PNA likely aspiration PNA, MRSA pneumonia  -Extubated on 6/23, currently stable on room  air -Respiratory cultures positive for MRSA, blood cultures negative so far -ID following, Unasyn DC'd on 6/24, continue linezolid -2D echo showed no valvular vegetation. -CT chest 6/22 showed interval progression of RUL LLL consolidative opacity with progression of cavitary component now measuring 3.2 cm with layering fluid level compatible with cavitary pneumonia, patchy nodular left lower lobe airspace disease. -Per pulmonology, lobar pneumonia with necrotizing component/abscess, recommended at least 2 weeks of antibiotics, needs follow-up checks x-ray in 8 to 10 weeks with consideration of CT if continues to be abnormal TEE requested.  Originally scheduled for 6/28.  Anesthesia had concerns with regards to her recent intubation requirement and therefore recommend to postpone the TEE.  Discussed with ID as well as cardiology currently both agreeable.  Will monitor. In the meantime patient will be on IV antibiotics.   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  -Patient was seen by neurosurgery on 6/18, no acute neurosurgical intervention recommended, supportive care, frequent exams, poor prognosis -2D echo showed EF of 60 to 65%, no regional WMA, normal diastolic parameters, RV systolic function normal, no vegetations Appreciate neurology evaluation.  Recommend supportive care. CIR has recommended EEG.  EEG was ordered and negative for any active seizures. Neurology has evaluated the patient on 6/25. Will provide IV hydration to see if improving hydration status improves patient's mentation.   Substance use,  cocaine abuse UDS on admission positive for cocaine, alcohol level less than 10, salicylate less than 7.0   Severe sepsis with lactic acidosis, due  to pneumonia Patient met SIRS criteria on admission with tachypnea hypotension, hypothermia and leukocytosis with source of infection being MRSA pneumonia. Required ventilator for respiratory failure as organ damage. Did not require pressors.  No evidence of shock. Sepsis physiology has resolved.   AKI with multiple electrolyte abnormalities, anion gap metabolic acidosis Admission serum creatinine 2.57.  Currently less than 1.  No prior baseline available but most likely baseline is normal based on current creatinine. Most likely combination of hypovolemia and ATN. Monitor for now.   hypernatremia On admission serum sodium 165.  Improved with IV hydration.   Hypokalemia hypocalcemia Improved, replace as needed   Mild transaminitis with rhabdomyolysis CK peaked at 900. LFTs peaked at 100.  Both improving.   Left buttock unstageable ulcer, present on admission.  Continue foam dressing.   Subjective: No acute events.  No nausea no vomiting no fever no chills.  Appears to be more sleepy today.  Physical Exam: I in mild distress. More sleepy today. Able to follow commands.  Actually able to verbalize better today compared to yesterday.  Data Reviewed: I have Reviewed nursing notes, Vitals, and Lab results. Reviewed CBC and BMP.  Disposition: Status is: Inpatient Remains inpatient appropriate because: Awaiting CIR placement.  Need further workup.  enoxaparin (LOVENOX) injection 40 mg Start: 11/01/22 1400 SCDs Start: 10/31/22 1802   Family Communication: No one at bedside Level of care: Telemetry Medical   Vitals:   11/10/22 2025 11/11/22 0035 11/11/22 0450 11/11/22 0808  BP: 101/68 106/72 109/72 111/71  Pulse: 84 91 81 69  Resp: 17 17 17 15   Temp: 98.5 F (36.9 C) 98.2 F (36.8 C) 98.6 F (37 C) 98.7 F (37.1 C)  TempSrc:       SpO2: 99% 97% 99% 97%  Weight:      Height:         Author: Lynden Oxford, MD 11/11/2022 5:51 PM  Please look on [email or web address] to find out who is on call.

## 2022-11-11 NOTE — Progress Notes (Addendum)
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 - MRI brain consistent with toxic metabolic injury such as carbon monoxide poisoning, bilateral corona radiator deep watershed distribution infarcts and small amount of bifrontal SAH. Possibly related to cocaine use. UDS positive for cocaine.  V/l corona radiata watershed infarcts -CT chest on 6/22 showed right lower lobe consolidative opacity with progression To comptroller implant measuring 3.2 cm compatible with cavitary pneumonia, patchy nodular left lower lobe airspace disease similar to prior with progression of confluent component compatible with pneumonia, 6 mm right middle lobe pulmonary nodule with recommended CT follow-up 6 to 12 months - TTE noted valves not seen well. Recommendations: - Switched linezolid to vanc - TEE pending repeat anesthesia evaluation. On room air today  Microbiology:   Antibiotics: Vancomycin 6/18 Metronidazole 6/18 Cefepime 6/18-19 Doxycycline 6/19 - 6/22 Unasyn 6/19 - 6/24 Linezolid 6/22-present Cultures: Blood 6/18 no growth Urine  Other Respiratory culture, tracheal aspirate - MRSA  SUBJECTIVE: Resting in bed.  Interval: Afebrile overnight.   Review of Systems: Review of Systems  All other systems reviewed and are negative.    Scheduled Meds:   Chlorhexidine Gluconate Cloth  6 each Topical Q0600   enoxaparin (LOVENOX) injection  40 mg Subcutaneous Q24H   feeding supplement  237 mL Oral BID BM   leptospermum manuka honey  1 Application Topical Daily   multivitamin with minerals  1 tablet Oral Daily   sodium chloride flush  10-40 mL Intracatheter Q12H   Continuous Infusions:  sodium chloride Stopped (11/05/22 1633)   sodium chloride 150 mL/hr at 11/11/22 1050   [START ON 11/12/2022] vancomycin     PRN Meds:.sodium chloride, acetaminophen, ondansetron (ZOFRAN) IV, mouth rinse, pentafluoroprop-tetrafluoroeth, polyethylene glycol, sodium chloride flush Not on File  OBJECTIVE: Vitals:   11/10/22 2025 11/11/22 0035 11/11/22 0450 11/11/22 0808  BP: 101/68 106/72 109/72 111/71  Pulse: 84 91 81 69  Resp: 17 17 17 15   Temp: 98.5 F (36.9 C) 98.2 F (36.8 C) 98.6 F (37 C) 98.7 F (37.1 C)  TempSrc:      SpO2: 99% 97% 99% 97%  Weight:      Height:       Body mass index is 21.08 kg/m.  Physical Exam Constitutional:      Appearance: Normal appearance.  HENT:     Head: Normocephalic and atraumatic.     Right Ear: Tympanic membrane normal.     Left Ear: Tympanic membrane normal.     Nose: Nose normal.     Mouth/Throat:     Mouth: Mucous membranes are moist.  Eyes:     Extraocular Movements: Extraocular movements intact.     Conjunctiva/sclera: Conjunctivae normal.     Pupils: Pupils are equal, round, and reactive to light.  Cardiovascular:     Rate and Rhythm: Normal rate and regular rhythm.     Heart sounds: No murmur heard.  No friction rub. No gallop.  Pulmonary:     Effort: Pulmonary effort is normal.     Breath sounds: Normal breath sounds.  Abdominal:     General: Abdomen is flat.     Palpations: Abdomen is soft.  Musculoskeletal:        General: Normal range of motion.  Skin:    General: Skin is warm and dry.  Neurological:     General: No focal deficit present.     Mental Status: She is alert and  oriented to person, place, and time.  Psychiatric:        Mood and Affect: Mood normal.       Lab Results Lab Results  Component Value Date   WBC 15.2 (H) 11/06/2022   HGB 11.0 (L) 11/06/2022   HCT 33.5 (L) 11/06/2022   MCV 99.7 11/06/2022   PLT 175 11/06/2022    Lab Results  Component Value Date   CREATININE 0.71 11/11/2022   BUN 26 (H) 11/11/2022   NA 133 (L) 11/11/2022   K 4.0 11/11/2022   CL 101 11/11/2022   CO2 24 11/11/2022    Lab Results  Component Value Date   ALT 90 (H) 11/07/2022   AST 67 (H) 11/07/2022   ALKPHOS 49 11/07/2022   BILITOT 0.6 11/07/2022        Danelle Earthly, MD [facility] 11/11/2022, 5:39 PM  I have personally spent 54 minutes involved in face-to-face and non-face-to-face activities for this patient on the day of the visit. Professional time spent includes the following activities: Preparing to see the patient (review of tests), Obtaining and/or reviewing separately obtained history (admission/discharge record), Performing a medically appropriate examination and/or evaluation , Ordering medications/tests/procedures, referring and communicating with other health care professionals, Documenting clinical information in the EMR, Independently interpreting results (not separately reported), Communicating results to the patient/family/caregiver, Counseling and educating the patient/family/caregiver and Care coordination (not separately reported).

## 2022-11-12 DIAGNOSIS — J9601 Acute respiratory failure with hypoxia: Secondary | ICD-10-CM | POA: Diagnosis not present

## 2022-11-12 LAB — BASIC METABOLIC PANEL
Anion gap: 10 (ref 5–15)
BUN: 17 mg/dL (ref 6–20)
CO2: 22 mmol/L (ref 22–32)
Calcium: 7.7 mg/dL — ABNORMAL LOW (ref 8.9–10.3)
Chloride: 102 mmol/L (ref 98–111)
Creatinine, Ser: 0.55 mg/dL (ref 0.44–1.00)
GFR, Estimated: >60 mL/min (ref >60)
Glucose, Bld: 150 mg/dL — ABNORMAL HIGH (ref 70–99)
Potassium: 3.2 mmol/L — ABNORMAL LOW (ref 3.5–5.1)
Sodium: 134 mmol/L — ABNORMAL LOW (ref 135–145)

## 2022-11-12 LAB — CORTISOL-AM, BLOOD: Cortisol - AM: 11.4 ug/dL (ref 6.7–22.6)

## 2022-11-12 MED ORDER — SODIUM CHLORIDE 0.9 % IV SOLN: INTRAVENOUS | Status: DC

## 2022-11-12 MED ORDER — MIDODRINE HCL 5 MG PO TABS
2.5000 mg | ORAL_TABLET | Freq: Two times a day (BID) | ORAL | Status: DC
  Administered 2022-11-12 – 2022-11-13 (×2): 2.5 mg via ORAL
  Filled 2022-11-12 (×2): qty 1

## 2022-11-12 MED ORDER — POTASSIUM CHLORIDE CRYS ER 20 MEQ PO TBCR
40.0000 meq | EXTENDED_RELEASE_TABLET | ORAL | Status: AC
  Administered 2022-11-12 (×2): 40 meq via ORAL
  Filled 2022-11-12 (×2): qty 2

## 2022-11-12 NOTE — Progress Notes (Signed)
[facility] Progress Note Patient: Nicole [name] HKV:[id number] DOB: 1975/07/11 DOA: 10/31/2022  DOS: the patient was seen and examined on 11/12/2022  Brief hospital course: 47 Y/o female with polysubstance abuse, was found unresponsive by her by bystander next-door condemned building.  EMS was notified.  She was hypoxic on EMS arrival, requiring 15 L NRB.  In ED, she was unresponsive, tachypneic, diaphoretic and critically ill, WBCs 43.6, serum osmolality 379, sodium 164, K3.1, creatinine 2.5.  She was intubated for airway protection and hypoxic respiratory failure. Chest x-ray showed right basilar airspace disease.  CT head showed symmetric edema within bilateral globus palladi concerning for ischemic/hypoxic injury. CT chest abdomen pelvis showed right greater than left lower lobe consolidation, nodular groundglass opacities with lucency in the right lower lobe concerning for cavitary infection. Patient was admitted to ICU, started on IV vancomycin and cefepime. Mother reported no history of major medical illnesses. 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  6/18 admitted after being found unresponsive and hypoxic with bilateral pneumonia right greater than left, severe dehydration and hypernatremia, AKI, and concern for possible anoxic injury intubated by EDP antibiotics initiated. 6/19 MRI shows bilateral globus pallidus injury 6/23: Extubated 6/25: Transferred to telemetry level, TRH assumed care  Assessment and Plan: Acute respiratory failure with hypoxia, bilateral pneumonia, cavitary RLL PNA likely aspiration PNA, MRSA pneumonia  -Extubated on 6/23, currently stable on room air -Respiratory cultures positive for MRSA, blood cultures negative so far -ID following, Unasyn DC'd on 6/24, continue linezolid -2D echo showed no valvular vegetation. -CT chest 6/22 showed interval  progression of RUL LLL consolidative opacity with progression of cavitary component now measuring 3.2 cm with layering fluid level compatible with cavitary pneumonia, patchy nodular left lower lobe airspace disease. -Per pulmonology, lobar pneumonia with necrotizing component/abscess, recommended at least 2 weeks of antibiotics, needs follow-up checks x-ray in 8 to 10 weeks with consideration of CT if continues to be abnormal. TEE requested. Originally scheduled for 6/28. Anesthesia had concerns with regards to her recent intubation requirement and therefore recommend to postpone the TEE.  Discussed with ID as well as cardiology currently both agreeable.  Will monitor. In the meantime patient will be on IV antibiotics.   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  -Patient was seen by neurosurgery on 6/18, no acute neurosurgical intervention recommended, supportive care, frequent exams, poor prognosis -2D echo showed EF of 60 to 65%, no regional WMA, normal diastolic parameters, RV systolic function normal, no vegetations Appreciate neurology evaluation.  Recommend supportive care. CIR has recommended EEG.  EEG was ordered and negative for any active seizures. Neurology has evaluated the patient on 6/25. Recommended to avoid hypotension. Patient was given IV fluids secondary to hypotension.  Now on midodrine as well.   Substance use, cocaine abuse UDS on admission positive for cocaine, alcohol level less than 10, salicylate less than 7.0   Severe sepsis with lactic acidosis, due to pneumonia Patient met SIRS  criteria on admission with tachypnea hypotension, hypothermia and leukocytosis with source of infection being MRSA pneumonia. Required ventilator for respiratory failure as organ  damage. Did not require pressors.  No evidence of shock. Sepsis physiology has resolved.   AKI with multiple electrolyte abnormalities, anion gap metabolic acidosis Admission serum creatinine 2.57.  Currently less than 1.  No prior baseline available but most likely baseline is normal based on current creatinine. Most likely combination of hypovolemia and ATN. Monitor for now.   hypernatremia On admission serum sodium 165.  Improved with IV hydration.   Hypokalemia hypocalcemia Improved, replace as needed   Mild transaminitis with rhabdomyolysis CK peaked at 900. LFTs peaked at 100.  Both improving.   Left buttock unstageable ulcer, present on admission.   Continue foam dressing.   Subjective: Denies any acute complaint.  No nausea vomiting no fever no chills.  Blood pressure running in 90s.  Physical Exam: General: in Mild distress, No Rash Cardiovascular: S1 and S2 Present, No Murmur Respiratory: Good respiratory effort, Bilateral Air entry present. No Crackles, No wheezes Abdomen: Bowel Sound present, No tenderness Extremities: No edema Neuro: Alert and oriented x3, no new focal deficit  Data Reviewed: I have Reviewed nursing notes, Vitals, and Lab results. Since last encounter, pertinent lab results BMP and cortisol   . I have ordered test including BMP and magnesium  .   Disposition: Status is: Inpatient Remains inpatient appropriate because: Stability of blood pressure and TEE  enoxaparin (LOVENOX) injection 40 mg Start: 11/01/22 1400 SCDs Start: 10/31/22 1802   Family Communication: No one at bedside Level of care: Telemetry Medical   Vitals:   11/11/22 2013 11/12/22 0449 11/12/22 0728 11/12/22 1601  BP: 118/78 108/80 96/62 96/61   Pulse: (!) 104 90 76 99  Resp: 18 17 18 17   Temp:  98.4 F (36.9 C) 99.3 F (37.4 C) 98.4 F (36.9 C) 99.2 F (37.3 C)  TempSrc: Oral  Oral Oral  SpO2: 98% 99% 99% 97%  Weight:      Height:         Author: Lynden Oxford, MD 11/12/2022 4:50 PM  Please look on [email or web address] to find out who is on call.

## 2022-11-13 ENCOUNTER — Encounter (HOSPITAL_COMMUNITY): Payer: Self-pay | Admitting: Pulmonary Disease

## 2022-11-13 DIAGNOSIS — J15212 Pneumonia due to Methicillin resistant Staphylococcus aureus: Secondary | ICD-10-CM | POA: Diagnosis not present

## 2022-11-13 DIAGNOSIS — F1990 Other psychoactive substance use, unspecified, uncomplicated: Secondary | ICD-10-CM | POA: Diagnosis not present

## 2022-11-13 DIAGNOSIS — J9601 Acute respiratory failure with hypoxia: Secondary | ICD-10-CM | POA: Diagnosis not present

## 2022-11-13 LAB — HEPATIC FUNCTION PANEL
ALT: 36 U/L (ref 0–44)
AST: 19 U/L (ref 15–41)
Albumin: <1.5 g/dL — ABNORMAL LOW (ref 3.5–5.0)
Alkaline Phosphatase: 40 U/L (ref 38–126)
Bilirubin, Direct: 0.1 mg/dL (ref 0.0–0.2)
Indirect Bilirubin: 0.3 mg/dL (ref 0.3–0.9)
Total Bilirubin: 0.4 mg/dL (ref 0.3–1.2)
Total Protein: 5.7 g/dL — ABNORMAL LOW (ref 6.5–8.1)

## 2022-11-13 LAB — BASIC METABOLIC PANEL WITH GFR
Anion gap: 4 — ABNORMAL LOW (ref 5–15)
BUN: 12 mg/dL (ref 6–20)
CO2: 20 mmol/L — ABNORMAL LOW (ref 22–32)
Calcium: 6.6 mg/dL — ABNORMAL LOW (ref 8.9–10.3)
Chloride: 114 mmol/L — ABNORMAL HIGH (ref 98–111)
Creatinine, Ser: 0.44 mg/dL (ref 0.44–1.00)
GFR, Estimated: >60 mL/min
Glucose, Bld: 98 mg/dL (ref 70–99)
Potassium: 2.9 mmol/L — ABNORMAL LOW (ref 3.5–5.1)
Sodium: 138 mmol/L (ref 135–145)

## 2022-11-13 LAB — MAGNESIUM: Magnesium: 1.4 mg/dL — ABNORMAL LOW (ref 1.7–2.4)

## 2022-11-13 MED ORDER — MAGNESIUM SULFATE 4 GM/100ML IV SOLN
4.0000 g | Freq: Once | INTRAVENOUS | Status: AC
  Administered 2022-11-13: 4 g via INTRAVENOUS
  Filled 2022-11-13: qty 100

## 2022-11-13 MED ORDER — POTASSIUM CHLORIDE CRYS ER 20 MEQ PO TBCR
40.0000 meq | EXTENDED_RELEASE_TABLET | ORAL | Status: AC
  Administered 2022-11-13 (×3): 40 meq via ORAL
  Filled 2022-11-13 (×3): qty 2

## 2022-11-13 MED ORDER — ALBUMIN HUMAN 5 % IV SOLN
12.5000 g | Freq: Once | INTRAVENOUS | Status: AC
  Administered 2022-11-13: 12.5 g via INTRAVENOUS
  Filled 2022-11-13: qty 250

## 2022-11-13 MED ORDER — MIDODRINE HCL 5 MG PO TABS
5.0000 mg | ORAL_TABLET | Freq: Three times a day (TID) | ORAL | Status: DC
  Administered 2022-11-13 – 2022-11-16 (×10): 5 mg via ORAL
  Filled 2022-11-13 (×10): qty 1

## 2022-11-13 MED ORDER — COSYNTROPIN 0.25 MG IJ SOLR: 0.2500 mg | Freq: Once | INTRAMUSCULAR | Status: DC

## 2022-11-13 NOTE — Progress Notes (Signed)
Speech Language Pathology Treatment: Dysphagia;Cognitive-Linquistic  Patient Details Name: Nicole [name] MRN: [id number] DOB: 1975-06-21 Today's Date: 11/13/2022 Time: 4098-1191 SLP Time Calculation (min) (ACUTE ONLY): 16 min  Assessment / Plan / Recommendation Clinical Impression  Pt in recliner; her mom at the bedside. Session focused on cognitive-communication. Pt demonstrated delayed response time, requiring max verbal cues to attend to examiner and respond to questions.  Required max cues for deep inhalation prior to counting to ten, most of productions aphonic with ongoing cues needed to achieve voice.  Verbal/tactile cues needed to initiate motor response for command-following.  She was able to grip a pen and draw figures on a pad her mother provided for her - accurate with drawing/writing to dictation but notable for significant perseverative activity, needing physical assist to cease motor patterns.  Disoriented to her DOB today.  Limited spontaneous verbal output noted.  Pt is responsive to cues and shows potential for improvement with intensive rehab.  D/W mother.   HPI HPI: 47 y.o. female admitted 6/18 after being found down and unresponsive. She was brought in by EMS and intubated in ED (ETT 6/18-23). Dx includes bilateral pneumonia right greater than left, severe dehydration and hypernatremia, AKI, and concern for possible anoxic injury. MRI head 6/18: "Symmetric signal abnormality within the globi pallidi is most  consistent with a toxic metabolic injury, such as carbon monoxide  poisoning. 2. 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  3. 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      SLP Plan  Continue with current plan of care      Recommendations for follow  up therapy are one component of a multi-disciplinary discharge planning process, led by the attending physician.  Recommendations may be updated based on patient status, additional functional criteria and insurance authorization.    Recommendations  Diet recommendations: Dysphagia 3 (mechanical soft);Thin liquid Liquids provided via: Cup;Straw Medication Administration: Whole meds with puree Supervision: Staff to assist with self feeding Compensations: Minimize environmental distractions                  Oral care BID   Frequent or constant Supervision/Assistance Cognitive communication deficit (R41.841)     Continue with current plan of care    Weyman Bogdon L. [name], MA CCC/SLP Clinical Specialist - Acute Care SLP Acute Rehabilitation Services Office number [phone]  Nicole [name]  11/13/2022, 2:57 PM

## 2022-11-13 NOTE — Progress Notes (Signed)
Physical Therapy Treatment Patient Details Name: [name] MRN: [id number] DOB: 07/04/1975 Today's Date: 11/13/2022   History of Present Illness 47 y.o. female admitted 6/18 after being found down and unresponsive. She was brought in by EMS and intubated in ED (ETT 6/18-23). Dx includes bilateral pneumonia right greater than left, severe dehydration and hypernatremia, AKI, and concern for possible anoxic injury. MRI head 6/18: "Symmetric signal abnormality within the globi pallidi is most  consistent with a toxic metabolic injury, such as carbon monoxide  poisoning. 2. 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  3. Small amount of bifrontal SAH". PMH includes remote history of drug abuse.    PT Comments    The pt was agreeable to session, progressing slowly with movement in UE and participation in session. Pt continues to require significant assist of 1 to complete bed mobility safely as well as to complete all OOB transfers. She demos poor functional use of LE and significant weakness in LE and core which results in increased need for support and repeated cues for maintaining positioning. Pt initially able to maintain static sitting with moments of minG, but progressed to needing modA due to fatigue in her core. Will continue to benefit from skilled PT to address functional strength, coordination, activity tolerance, and balance. Recommendations remain appropriate.     Recommendations for follow up therapy are one component of a multi-disciplinary discharge planning process, led by the attending physician.  Recommendations may be updated based on patient status, additional functional criteria and insurance authorization.  Follow Up Recommendations       Assistance Recommended at Discharge Frequent or constant Supervision/Assistance  Patient can return home with the following Two people to help with walking and/or transfers;Two people to help with bathing/dressing/bathroom    Equipment Recommendations  None recommended by PT    Recommendations for Other Services       Precautions / Restrictions Precautions Precautions: Fall Precaution Comments: contact precautions Restrictions Weight Bearing Restrictions: No     Mobility  Bed Mobility Overal bed mobility: Needs Assistance Bed Mobility: Supine to Sit Rolling: Total assist   Supine to sit: Total assist, HOB elevated     General bed mobility comments: assistance for trunk and BLEs    Transfers Overall transfer level: Needs assistance Equipment used: 1 person hand held assist Transfers: Bed to chair/wheelchair/BSC, Sit to/from Stand Sit to Stand: Total assist, +2 physical assistance Stand pivot transfers: Total assist         General transfer comment: pt completed stand-pivot in session with totalA to block BLE and facilitate hip extension and lift at trunk. pt with no assist at LE to power up to standing. returned after session briefly to adjust the pt and she required totalA of 2 to complete sit-stand but better participated in wt acceptance in LE with use of +2 and chuck pad to lift at hips    Ambulation/Gait               General Gait Details: pt unable to stand      Balance Overall balance assessment: Needs assistance Sitting-balance support: Bilateral upper extremity supported, Feet supported Sitting balance-Leahy Scale: Poor Sitting balance - Comments: sitting EOB with mod-max A; brief  episode of min guard A Postural control: Posterior lean Standing balance support: Bilateral upper extremity supported Standing balance-Leahy Scale: Zero Standing balance comment: totalA with bilateral knee blocking  Cognition Arousal/Alertness: Awake/alert Behavior During Therapy: Flat affect Overall Cognitive Status: Impaired/Different from baseline Area of Impairment: Orientation, Attention, Following commands, Safety/judgement, Problem solving,  Awareness                 Orientation Level: Time, Situation (Provided month, disoriented to year) Current Attention Level: Focused Memory: Decreased short-term memory Following Commands: Follows one step commands with increased time Safety/Judgement: Decreased awareness of safety, Decreased awareness of deficits Awareness: Intellectual Problem Solving: Slow processing, Decreased initiation, Difficulty sequencing, Requires verbal cues, Requires tactile cues General Comments: Benefits from simple 1 step functional tasks, limited carryover with nonfunctional commands. A+O to self, place and month. Disoriented to year and situation.        Exercises Other Exercises Other Exercises: calf stretches 2 x 1 min    General Comments General comments (skin integrity, edema, etc.): VSS on RA; pt found in brief upon arrival, sacral wound noted during pericare, RN notified and encouraged nursing team to not use brief 2/2 risk of moisture related skin breakdown.      Pertinent Vitals/Pain Pain Assessment Pain Assessment: Faces Faces Pain Scale: Hurts a little bit Pain Location: generalized Pain Descriptors / Indicators: Discomfort, Grimacing Pain Intervention(s): Limited activity within patient's tolerance, Monitored during session, Repositioned     PT Goals (current goals can now be found in the care plan section) Acute Rehab PT Goals Patient Stated Goal: per mother, return to PLOF PT Goal Formulation: With patient/family Time For Goal Achievement: 11/20/22 Potential to Achieve Goals: Fair Progress towards PT goals: Progressing toward goals    Frequency    Min 4X/week      PT Plan Current plan remains appropriate    Co-evaluation PT/OT/SLP Co-Evaluation/Treatment: Yes Reason for Co-Treatment: Complexity of the patient's impairments (multi-system involvement);For patient/therapist safety;To address functional/ADL transfers PT goals addressed during session: Mobility/safety  with mobility;Balance;Strengthening/ROM OT goals addressed during session: ADL's and self-care      AM-PAC PT "6 Clicks" Mobility   Outcome Measure  Help needed turning from your back to your side while in a flat bed without using bedrails?: Total Help needed moving from lying on your back to sitting on the side of a flat bed without using bedrails?: Total Help needed moving to and from a bed to a chair (including a wheelchair)?: Total Help needed standing up from a chair using your arms (e.g., wheelchair or bedside chair)?: Total Help needed to walk in hospital room?: Total Help needed climbing 3-5 steps with a railing? : Total 6 Click Score: 6    End of Session Equipment Utilized During Treatment: Gait belt Activity Tolerance: Patient tolerated treatment well;Patient limited by fatigue Patient left: with call bell/phone within reach;with chair alarm set;in chair Nurse Communication: Mobility status PT Visit Diagnosis: Other abnormalities of gait and mobility (R26.89);Muscle weakness (generalized) (M62.81);Hemiplegia and hemiparesis Hemiplegia - Right/Left: Left Hemiplegia - dominant/non-dominant: Non-dominant Hemiplegia - caused by: Unspecified;Cerebral infarction     Time: 4098-1191 PT Time Calculation (min) (ACUTE ONLY): 38 min  Charges:  $Therapeutic Exercise: 8-22 mins                     Vickki Muff, PT, DPT   Acute Rehabilitation Department Office [phone] Secure Chat Communication Preferred   Ronnie Derby 11/13/2022, 12:02 PM

## 2022-11-13 NOTE — Progress Notes (Signed)
[facility] Progress Note Patient: MYRON SCHUE CBJ:[id number] DOB: 17-Sep-1975 DOA: 10/31/2022  DOS: the patient was seen and examined on 11/13/2022  Brief hospital course: 47 Y/o female with polysubstance abuse, was found unresponsive by her by bystander next-door condemned building.  EMS was notified.  She was hypoxic on EMS arrival, requiring 15 L NRB.  In ED, she was unresponsive, tachypneic, diaphoretic and critically ill, WBCs 43.6, serum osmolality 379, sodium 164, K3.1, creatinine 2.5.  She was intubated for airway protection and hypoxic respiratory failure. Chest x-ray showed right basilar airspace disease.  CT head showed symmetric edema within bilateral globus palladi concerning for ischemic/hypoxic injury. CT chest abdomen pelvis showed right greater than left lower lobe consolidation, nodular groundglass opacities with lucency in the right lower lobe concerning for cavitary infection. Patient was admitted to ICU, started on IV vancomycin and cefepime. Mother reported no history of major medical illnesses. 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  6/18 admitted after being found unresponsive and hypoxic with bilateral pneumonia right greater than left, severe dehydration and hypernatremia, AKI, and concern for possible anoxic injury intubated by EDP antibiotics initiated. 6/19 MRI shows bilateral globus pallidus injury 6/23: Extubated 6/25: Transferred to telemetry level, TRH assumed care  Assessment and Plan: Acute respiratory failure with hypoxia, bilateral pneumonia, cavitary RLL PNA likely aspiration PNA, MRSA pneumonia  -Extubated on 6/23, currently stable on room air -Respiratory cultures positive for MRSA, blood cultures negative so far -ID following, Unasyn DC'd on 6/24, continue linezolid -2D echo showed no valvular vegetation. -CT chest 6/22 showed interval  progression of RUL LLL consolidative opacity with progression of cavitary component now measuring 3.2 cm with layering fluid level compatible with cavitary pneumonia, patchy nodular left lower lobe airspace disease. -Per pulmonology, lobar pneumonia with necrotizing component/abscess, recommended at least 2 weeks of antibiotics, needs follow-up checks x-ray in 8 to 10 weeks with consideration of CT if continues to be abnormal. TEE requested. Originally scheduled for 6/28. Anesthesia had concerns with regards to her recent intubation requirement and therefore recommend to postpone the TEE.  Discussed with ID as well as cardiology currently both agreeable.  Will monitor. In the meantime patient will be on IV antibiotics.   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  -Patient was seen by neurosurgery on 6/18, no acute neurosurgical intervention recommended, supportive care, frequent exams, poor prognosis -2D echo showed EF of 60 to 65%, no regional WMA, normal diastolic parameters, RV systolic function normal, no vegetations Appreciate neurology evaluation.  Recommend supportive care. CIR has recommended EEG.  EEG was ordered and negative for any active seizures. Neurology has evaluated the patient on 6/25. Recommended to avoid hypotension. Patient was given IV fluids secondary to hypotension.  Now on midodrine as well.  Blood pressure improving with higher dose of midodrine.   Substance use, cocaine abuse UDS on admission positive for cocaine, alcohol level less than 10, salicylate less than 7.0   Severe sepsis  with lactic acidosis, due to pneumonia Patient met SIRS criteria on admission with tachypnea hypotension, hypothermia and leukocytosis with source of infection being  MRSA pneumonia. Required ventilator for respiratory failure as organ damage. Did not require pressors.  No evidence of shock. Sepsis physiology has resolved.   AKI with multiple electrolyte abnormalities, anion gap metabolic acidosis Admission serum creatinine 2.57.  Currently less than 1.  No prior baseline available but most likely baseline is normal based on current creatinine. Most likely combination of hypovolemia and ATN. Monitor for now.   hypernatremia On admission serum sodium 165.  Improved with IV hydration.   Hypokalemia hypocalcemia Improved, replace as needed   Mild transaminitis with rhabdomyolysis CK peaked at 900. LFTs peaked at 100.  Both improving.   Left buttock unstageable ulcer, present on admission.   Continue foam dressing.   Subjective: No nausea no vomiting no acute complaints.  No chest pain.   Physical Exam: In mild distress. No rash. Reports neck pain. Alert and awake.  No acute neurodeficit.  Data Reviewed: I have Reviewed nursing notes, Vitals, and Lab results. Since last encounter, pertinent lab results CBC and BMP and LFT   . I have ordered test including CBC and BMP  .   Disposition: Status is: Inpatient Remains inpatient appropriate because: Requiring IV fluid, midodrine awaiting stability of hemodynamics and performing TEE after that.  enoxaparin (LOVENOX) injection 40 mg Start: 11/01/22 1400 SCDs Start: 10/31/22 1802   Family Communication: Discussed with mother on the bedside Level of care: Telemetry Medical   Vitals:   11/13/22 0603 11/13/22 0723 11/13/22 1305 11/13/22 1608  BP:  (!) 95/56 109/70 107/69  Pulse: 65 70 92 87  Resp:  18  18  Temp:  98 F (36.7 C)  98.3 F (36.8 C)  TempSrc:    Oral  SpO2:  100%    Weight:      Height:         Author: Lynden Oxford, MD 11/13/2022 7:05 PM  Please look on [email or web address] to find out who is on call.

## 2022-11-13 NOTE — Progress Notes (Signed)
   11/13/22 1400  Spiritual Encounters  Type of Visit Initial  Care provided to: Patient  Conversation partners present during encounter Nurse  Referral source Family  Reason for visit Advance directives  OnCall Visit No   Ch responded to request for AD. [name] family was not at bedside. When I visited pt, she is able to shake her head to answer yes or no. Ch could not determine if pt was oriented. [name] was the one who ordered the AD. Ch told beside nurse to page Ch when mother is present. No follow-up needed at this time.

## 2022-11-13 NOTE — Progress Notes (Signed)
Occupational Therapy Treatment Patient Details Name: Nicole [name] MRN: [id number] DOB: 1976/02/05 Today's Date: 11/13/2022   History of present illness 47 y.o. female admitted 6/18 after being found down and unresponsive. She was brought in by EMS and intubated in ED (ETT 6/18-23). Dx includes bilateral pneumonia right greater than left, severe dehydration and hypernatremia, AKI, and concern for possible anoxic injury. MRI head 6/18: "Symmetric signal abnormality within the globi pallidi is most  consistent with a toxic metabolic injury, such as carbon monoxide  poisoning. 2. 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  3. Small amount of bifrontal SAH". PMH includes remote history of drug abuse.   OT comments  Pt progressing towards therapy goals. Pt demonstrating improved orientation this date, oriented to self, month and location. Pt continues to demonstrate improved trunk and neck control while sitting EOB with brief episode of min guard A. Pt continues to benefit from simple 1-step functional demands. Pt demonstrating improved functional use of BUEs during self-feeding and grooming tasks. Resting tremor noted with R hand, resolves with intentional movements. Pt would benefit from continued acute OT services to maximize functional independence and facilitate transition to intensive inpatient follow up therapy, >3 hours/day after discharge.    Recommendations for follow up therapy are one component of a multi-disciplinary discharge planning process, led by the attending physician.  Recommendations may be updated based on patient status, additional functional criteria and insurance authorization.    Assistance Recommended at Discharge Frequent or constant Supervision/Assistance  Patient can return home with the following  Two people to help with walking and/or transfers;Two people to help with bathing/dressing/bathroom;Assistance with cooking/housework;Direct supervision/assist for  medications management;Direct supervision/assist for financial management;Assist for transportation;Help with stairs or ramp for entrance   Equipment Recommendations  Other (comment) (defer)    Recommendations for Other Services      Precautions / Restrictions Precautions Precautions: Fall Precaution Comments: contact precautions Restrictions Weight Bearing Restrictions: No       Mobility Bed Mobility Overal bed mobility: Needs Assistance Bed Mobility: Supine to Sit Rolling: Total assist   Supine to sit: Total assist, HOB elevated     General bed mobility comments: assistance for trunk and BLEs    Transfers Overall transfer level: Needs assistance Equipment used: 1 person hand held assist Transfers: Bed to chair/wheelchair/BSC       Step pivot transfers: Total assist           Balance Overall balance assessment: Needs assistance Sitting-balance support: Bilateral upper extremity supported, Feet supported Sitting balance-Leahy Scale: Poor Sitting balance - Comments: sitting EOB with mod-max A; brief  episode of min guard A   Standing balance support: Bilateral upper extremity supported   Standing balance comment: total A for SPT                           ADL either performed or assessed with clinical judgement   ADL Overall ADL's : Needs assistance/impaired Eating/Feeding: Minimal assistance;Sitting Eating/Feeding Details (indicate cue type and reason): assist for package management, cutting food, placing cup/utensil in hand; requires max cues for taking small sips Grooming: Minimal assistance;Sitting;Wash/dry hands;Wash/dry face Grooming Details (indicate cue type and reason): washed bilat hands with washcloth with supervision and min cues for inititation. Washed face with washcloth using BUEs with min A for thoroughness.                 Toilet Transfer: Total assistance;Stand-pivot Toilet Transfer Details (indicate cue type and  reason):  simulated Toileting- Clothing Manipulation and Hygiene: Total assistance;Bed level Toileting - Clothing Manipulation Details (indicate cue type and reason): total A for pericare at bed level     Functional mobility during ADLs: Total assistance General ADL Comments: lmited secondary to cognition, generalized weakness, decreased balance    Extremity/Trunk Assessment Upper Extremity Assessment Upper Extremity Assessment: RUE deficits/detail RUE Deficits / Details: Difficult to assess 2/2 cognition. Pt able to use during functional tasks (self-feeding, grooming) with cues and increased time. Resting tremor noted. LUE Deficits / Details: Difficult to assess 2/2 cognition. Pt able to use during functional tasks (grooming, donning glasses) with cues and increased time.   Lower Extremity Assessment Lower Extremity Assessment: Defer to PT evaluation        Vision   Vision Assessment?: No apparent visual deficits Additional Comments: Difficult to assess 2/2 cognition, wears glasses. Will further assess.   Perception Perception Perception: Not tested   Praxis Praxis Praxis: Not tested    Cognition Arousal/Alertness: Awake/alert Behavior During Therapy: Flat affect Overall Cognitive Status: Impaired/Different from baseline Area of Impairment: Orientation, Attention, Following commands, Safety/judgement, Problem solving, Awareness                 Orientation Level: Time, Situation (Provided month, disoriented to year) Current Attention Level: Focused Memory: Decreased short-term memory Following Commands: Follows one step commands with increased time Safety/Judgement: Decreased awareness of safety, Decreased awareness of deficits Awareness: Intellectual Problem Solving: Slow processing, Decreased initiation, Difficulty sequencing, Requires verbal cues, Requires tactile cues General Comments: Benefits from simple 1 step functional tasks, limited carryover with nonfunctional  commands. A+O to self, place and month. Disoriented to year and situation.        Exercises      Shoulder Instructions       General Comments VSS on RA; pt found in brief upon arrival, sacral wound noted during pericare, RN notified and encouraged nursing team to not use brief 2/2 risk of moisture related skin breakdown.    Pertinent Vitals/ Pain       Pain Assessment Pain Assessment: Faces Faces Pain Scale: Hurts a little bit Pain Location: generalized Pain Descriptors / Indicators: Discomfort, Grimacing Pain Intervention(s): Limited activity within patient's tolerance, Monitored during session  Home Living                                          Prior Functioning/Environment              Frequency  Min 2X/week        Progress Toward Goals  OT Goals(current goals can now be found in the care plan section)     Acute Rehab OT Goals Patient Stated Goal: to eat breakfast OT Goal Formulation: With patient Time For Goal Achievement: 11/20/22 Potential to Achieve Goals: Good ADL Goals Pt Will Perform Lower Body Dressing: with mod assist;sit to/from stand Pt Will Transfer to Toilet: with mod assist;bedside commode;stand pivot transfer Additional ADL Goal #1: Pt will utilize LUE as a functional assist during ADL tasks Additional ADL Goal #2: Pt will sequence 2-step ADL task with min cues  Plan Discharge plan remains appropriate;Frequency remains appropriate    Co-evaluation    PT/OT/SLP Co-Evaluation/Treatment: Yes Reason for Co-Treatment: Complexity of the patient's impairments (multi-system involvement);For patient/therapist safety;To address functional/ADL transfers   OT goals addressed during session: ADL's and self-care      AM-PAC  OT "6 Clicks" Daily Activity     Outcome Measure   Help from another person eating meals?: A Little Help from another person taking care of personal grooming?: A Lot Help from another person toileting,  which includes using toliet, bedpan, or urinal?: Total Help from another person bathing (including washing, rinsing, drying)?: A Lot Help from another person to put on and taking off regular upper body clothing?: A Lot Help from another person to put on and taking off regular lower body clothing?: Total 6 Click Score: 11    End of Session Equipment Utilized During Treatment: Gait belt  OT Visit Diagnosis: Unsteadiness on feet (R26.81);Muscle weakness (generalized) (M62.81);Other symptoms and signs involving cognitive function   Activity Tolerance Patient tolerated treatment well   Patient Left in chair;with call bell/phone within reach;with chair alarm set   Nurse Communication Mobility status        Time: 1610-9604 OT Time Calculation (min): 38 min  Charges: OT General Charges $OT Visit: 1 Visit OT Treatments $Self Care/Home Management : 23-37 mins  Sherley Bounds, OTS Acute Rehabilitation Services Office [phone] Secure Chat Communication Preferred   Sherley Bounds 11/13/2022, 9:27 AM

## 2022-11-13 NOTE — Progress Notes (Signed)
Inpatient Rehab Admissions Coordinator:   Continues to await TEE pending anesthesiology assessment.  Will follow.   Estill Dooms, PT, DPT Admissions Coordinator [phone] 11/13/22  10:16 AM

## 2022-11-13 NOTE — Progress Notes (Signed)
    [facility]   Reason for visit: Follow up on cavitary pneumonia  Interval History: afebrile; day 14 total antibiotics; she is afebrile.      Physical Exam: Constitutional:  Vitals:   11/13/22 0723 11/13/22 1305  BP: (!) 95/56 109/70  Pulse: 70 92  Resp: 18   Temp: 98 F (36.7 C)   SpO2: 100%    patient appears in NAD Respiratory: Normal respiratory effort  Review of Systems: Constitutional: negative for fevers and chills  Lab Results  Component Value Date   WBC 15.2 (H) 11/06/2022   HGB 11.0 (L) 11/06/2022   HCT 33.5 (L) 11/06/2022   MCV 99.7 11/06/2022   PLT 175 11/06/2022    Lab Results  Component Value Date   CREATININE 0.44 11/13/2022   BUN 12 11/13/2022   NA 138 11/13/2022   K 2.9 (L) 11/13/2022   CL 114 (H) 11/13/2022   CO2 20 (L) 11/13/2022    Lab Results  Component Value Date   ALT 90 (H) 11/07/2022   AST 67 (H) 11/07/2022   ALKPHOS 49 11/07/2022     Microbiology: No results found for this or any previous visit (from the past 240 hour(s)).  Impression/Plan:  1. Acute respiratory failure with cavitary pneumonia with MRSA positive culture in trach aspirate - she is on vancomycin for now and previously on linezolid.  Blood cultures have remained negative.   I recommend about 4 weeks of antibiotics and reassess with CXR; can use linezolid at discharge to complete 4 weeks  2.  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  From an ID standpoint, I do not feel this is concerning for an embolic insult from endocarditis and therefore I do not feel a TEE is absolutely indicated for this.  I recommend treatment as above with oral antibiotics at discharge with close follow up.    3.  Substance abuse - will need sa counseling to remain drug free.

## 2022-11-13 NOTE — Progress Notes (Signed)
Nutrition Follow-up  DOCUMENTATION CODES:   Not applicable  INTERVENTION:  - Continue Ensure Enlive po BID, each supplement provides 350 kcal and 20 grams of protein.  NUTRITION DIAGNOSIS:   Inadequate oral intake related to inability to eat as evidenced by NPO status.  GOAL:   Patient will meet greater than or equal to 90% of their needs  MONITOR:   Diet advancement, Labs, Weight trends, Skin  REASON FOR ASSESSMENT:   Ventilator, Consult Enteral/tube feeding initiation and management  ASSESSMENT:   Nicole [name] presented to the ED after being found unresponsive in a field. Unknown PMH. Pt intubated for airway protection, admitted with AKI, bilateral pneumonia, dehydration, hypernatremia, and acute metabolic encephalopathy.  Meds reviewed:  MVI. Labs reviewed: Na low, K low.   Pt ate 100% of her breakfast this am. Per record, pt has eaten 75-100% of her meals over the past 7 days. The pt is meeting her needs at this time. RD will continue to monitor PO intakes.   Diet Order:   Diet Order             DIET DYS 3 Room service appropriate? Yes; Fluid consistency: Thin  Diet effective now                   EDUCATION NEEDS:   Not appropriate for education at this time  Skin:  Skin Assessment: Skin Integrity Issues: Skin Integrity Issues:: Unstageable Unstageable: L Buttocks  Last BM:  6/30 - type 4  Height:   Ht Readings from Last 1 Encounters:  10/31/22 5\' 1"  (1.549 m)    Weight:   Wt Readings from Last 1 Encounters:  11/13/22 48.3 kg    Ideal Body Weight:  47.7 kg  BMI:  Body mass index is 20.12 kg/m.  Estimated Nutritional Needs:   Kcal:  1600-1800  Protein:  80-100 grams  Fluid:  >/= 1.6 L  Bethann Humble, RD, LDN, CNSC.

## 2022-11-13 NOTE — TOC Progression Note (Signed)
Transition of Care [facility]) - Progression Note    Patient Details  Name: Nicole [name] MRN: [id number] Date of Birth: Dec 29, 1975  Transition of Care [facility]) CM/SW Contact  Tom Macpherson A [place], [place] Phone Number: 11/13/2022, 12:56 PM  Clinical Narrative:     CSW met with pt's mother, [name] at bedside. She stated that she wanted to assist pt and find out about Medicaid covering pt's admission, but cannot access pt's records due to not being health care power of attorney.  CSW notified nursing to request consult from chaplain regarding need for pt, if consents, to have her mother assist with facilitating care for the pt.   CSW also provided resources to [facility] to pt's mother to inquire about financial options for care.   TOC will continue to follow.        Expected Discharge Plan and Services                                               Social Determinants of Health (SDOH) Interventions    Readmission Risk Interventions     No data to display

## 2022-11-13 NOTE — TOC Progression Note (Signed)
Transition of Care [facility]) - Progression Note    Patient Details  Name: Nicole [name] MRN: [id number] Date of Birth: [birth year]  Transition of Care [facility]) CM/SW Contact  Janae Bridgeman, RN Phone Number: 11/13/2022, 11:28 AM  Clinical Narrative:    CIR is continuing to follow the patient for admission.  CM will continue to follow the patient for needs as the patient progresses.        Expected Discharge Plan and Services                                               Social Determinants of Health (SDOH) Interventions    Readmission Risk Interventions     No data to display

## 2022-11-14 ENCOUNTER — Inpatient Hospital Stay (HOSPITAL_COMMUNITY): Payer: MEDICAID

## 2022-11-14 DIAGNOSIS — J9601 Acute respiratory failure with hypoxia: Secondary | ICD-10-CM | POA: Diagnosis not present

## 2022-11-14 DIAGNOSIS — J15212 Pneumonia due to Methicillin resistant Staphylococcus aureus: Secondary | ICD-10-CM | POA: Diagnosis not present

## 2022-11-14 DIAGNOSIS — F1990 Other psychoactive substance use, unspecified, uncomplicated: Secondary | ICD-10-CM | POA: Diagnosis not present

## 2022-11-14 LAB — BASIC METABOLIC PANEL
BUN: 15 mg/dL (ref 6–20)
CO2: 23 mmol/L (ref 22–32)
Calcium: 8.3 mg/dL — ABNORMAL LOW (ref 8.9–10.3)
Chloride: 102 mmol/L (ref 98–111)
Creatinine, Ser: 0.59 mg/dL (ref 0.44–1.00)
Glucose, Bld: 99 mg/dL (ref 70–99)
Potassium: 4.1 mmol/L (ref 3.5–5.1)

## 2022-11-14 LAB — CBC WITH DIFFERENTIAL/PLATELET
Abs Immature Granulocytes: 0.05 10*3/uL (ref 0.00–0.07)
Basophils Absolute: 0.1 10*3/uL (ref 0.0–0.1)
Basophils Relative: 1 %
Eosinophils Absolute: 0 10*3/uL (ref 0.0–0.5)
Eosinophils Relative: 0 %
HCT: 33 % — ABNORMAL LOW (ref 36.0–46.0)
Hemoglobin: 10.8 g/dL — ABNORMAL LOW (ref 12.0–15.0)
Immature Granulocytes: 0 %
Lymphocytes Relative: 17 %
Lymphs Abs: 2.1 10*3/uL (ref 0.7–4.0)
MCH: 31.8 pg (ref 26.0–34.0)
MCHC: 32.7 g/dL (ref 30.0–36.0)
MCV: 97.1 fL (ref 80.0–100.0)
Monocytes Absolute: 0.8 10*3/uL (ref 0.1–1.0)
Monocytes Relative: 6 %
Neutro Abs: 9.5 10*3/uL — ABNORMAL HIGH (ref 1.7–7.7)
Neutrophils Relative %: 76 %
Platelets: 395 10*3/uL (ref 150–400)
RBC: 3.4 MIL/uL — ABNORMAL LOW (ref 3.87–5.11)
RDW: 12.2 % (ref 11.5–15.5)
WBC: 12.5 10*3/uL — ABNORMAL HIGH (ref 4.0–10.5)
nRBC: 0 % (ref 0.0–0.2)

## 2022-11-14 LAB — BASIC METABOLIC PANEL WITH GFR
Anion gap: 9 (ref 5–15)
GFR, Estimated: >60 mL/min (ref >60)
Sodium: 134 mmol/L — ABNORMAL LOW (ref 135–145)

## 2022-11-14 LAB — VANCOMYCIN, TROUGH: Vancomycin Tr: 4 ug/mL — ABNORMAL LOW (ref 15–20)

## 2022-11-14 LAB — MAGNESIUM: Magnesium: 2.2 mg/dL (ref 1.7–2.4)

## 2022-11-14 LAB — PROCALCITONIN: Procalcitonin: <0.1 ng/mL

## 2022-11-14 MED ORDER — LINEZOLID 600 MG PO TABS
600.0000 mg | ORAL_TABLET | Freq: Two times a day (BID) | ORAL | Status: DC
  Administered 2022-11-14 – 2022-11-16 (×4): 600 mg via ORAL
  Filled 2022-11-14 (×5): qty 1

## 2022-11-14 NOTE — Progress Notes (Signed)
ID Pharmacy Note  Spoke with Dr. Luciana Axe- Vancomycin trough came back low at 4. Will go ahead and switch patient to PO Linezolid 600 mg BID to complete 4 weeks of therapy.    Sharin Mons, PharmD, BCPS, BCIDP Infectious Diseases Clinical Pharmacist Phone: [phone] 11/14/2022 2:58 PM

## 2022-11-14 NOTE — Progress Notes (Signed)
Physical Therapy Treatment Patient Details Name: Nicole [name] MRN: [id number] DOB: [birth year] Today's Date: 11/14/2022   History of Present Illness 47 y.o. female admitted 6/18 after being found down and unresponsive. She was brought in by EMS and intubated in ED (ETT 6/18-23). Dx includes bilateral pneumonia right greater than left, severe dehydration and hypernatremia, AKI, and concern for possible anoxic injury. MRI head 6/18: "Symmetric signal abnormality within the globi pallidi is most  consistent with a toxic metabolic injury, such as carbon monoxide  poisoning. 2. 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  3. Small amount of bifrontal SAH". PMH includes remote history of drug abuse.    PT Comments   Pt greeted resting in bed and agreeable to session with slow but continued progress towards with mobility and participation. Pt continues to require total A for bed mobility and OOB transfers, however pt with some core engagement in static sitting with ability to maintain sitting balance for brief time before fatigue, progressing to mod A to maintain with increased time. Pt continues to be limited by weakness LE>UE, decreased activity tolerance, and impaired cognition needing increased cues throughout to maintain alertness and attention to task. Current plan remains appropriate to address deficits and maximize functional independence and decrease caregiver burden. Pt continues to benefit from skilled PT services to progress toward functional mobility goals.      Assistance Recommended at Discharge Frequent or constant Supervision/Assistance  If plan is discharge home, recommend the following:  Can travel by private vehicle    Two people to help with walking and/or transfers;Two people to help with bathing/dressing/bathroom      Equipment Recommendations  None recommended by PT    Recommendations for Other Services       Precautions / Restrictions  Precautions Precautions: Fall Precaution Comments: contact precautions Restrictions Weight Bearing Restrictions: No     Mobility  Bed Mobility Overal bed mobility: Needs Assistance Bed Mobility: Supine to Sit     Supine to sit: Total assist, HOB elevated     General bed mobility comments: assistance for trunk and BLEs    Transfers Overall transfer level: Needs assistance Equipment used: 1 person hand held assist Transfers: Bed to chair/wheelchair/BSC, Sit to/from Stand Sit to Stand: Total assist Stand pivot transfers: Total assist         General transfer comment: total assist and blocking at BLE and facilitation to extend hips and elevate trunk to come to stand with no assist at LEs. pt accepting weight through LEs on second stand from recliner    Ambulation/Gait               General Gait Details: pt unable to stand   Stairs             Wheelchair Mobility     Tilt Bed    Modified Rankin (Stroke Patients Only) Modified Rankin (Stroke Patients Only) Pre-Morbid Rankin Score: No symptoms Modified Rankin: Severe disability     Balance Overall balance assessment: Needs assistance Sitting-balance support: Bilateral upper extremity supported, Feet supported Sitting balance-Leahy Scale: Poor Sitting balance - Comments: sitting EOB with mod-max A; brief  episode of min guard A Postural control: Posterior lean Standing balance support: Bilateral upper extremity supported Standing balance-Leahy Scale: Zero Standing balance comment: totalA with bilateral knee blocking                            Cognition Arousal/Alertness: Awake/alert Behavior During Therapy:  Flat affect Overall Cognitive Status: Impaired/Different from baseline Area of Impairment: Orientation, Attention, Following commands, Safety/judgement, Problem solving, Awareness                 Orientation Level: Time, Situation Current Attention Level: Focused Memory:  Decreased short-term memory Following Commands: Follows one step commands with increased time Safety/Judgement: Decreased awareness of safety, Decreased awareness of deficits Awareness: Intellectual Problem Solving: Slow processing, Decreased initiation, Difficulty sequencing, Requires verbal cues, Requires tactile cues General Comments: Benefits from simple 1 step functional tasks, limited carryover with nonfunctional commands.        Exercises      General Comments General comments (skin integrity, edema, etc.): VSS on RA      Pertinent Vitals/Pain Pain Assessment Pain Assessment: Faces Faces Pain Scale: Hurts a little bit Pain Location: generalized with LE movement Pain Descriptors / Indicators: Discomfort, Grimacing Pain Intervention(s): Monitored during session, Limited activity within patient's tolerance    Home Living                          Prior Function            PT Goals (current goals can now be found in the care plan section) Acute Rehab PT Goals Patient Stated Goal: per mother, return to PLOF PT Goal Formulation: With patient/family Time For Goal Achievement: 11/20/22 Progress towards PT goals: Progressing toward goals    Frequency    Min 4X/week      PT Plan Current plan remains appropriate    Co-evaluation              AM-PAC PT "6 Clicks" Mobility   Outcome Measure  Help needed turning from your back to your side while in a flat bed without using bedrails?: Total Help needed moving from lying on your back to sitting on the side of a flat bed without using bedrails?: Total Help needed moving to and from a bed to a chair (including a wheelchair)?: Total Help needed standing up from a chair using your arms (e.g., wheelchair or bedside chair)?: Total Help needed to walk in hospital room?: Total Help needed climbing 3-5 steps with a railing? : Total 6 Click Score: 6    End of Session Equipment Utilized During Treatment: Gait  belt Activity Tolerance: Patient tolerated treatment well;Patient limited by fatigue Patient left: with call bell/phone within reach;with chair alarm set;in chair Nurse Communication: Mobility status PT Visit Diagnosis: Other abnormalities of gait and mobility (R26.89);Muscle weakness (generalized) (M62.81);Hemiplegia and hemiparesis Hemiplegia - Right/Left: Left Hemiplegia - dominant/non-dominant: Non-dominant Hemiplegia - caused by: Unspecified;Cerebral infarction     Time: 3557-3220 PT Time Calculation (min) (ACUTE ONLY): 26 min  Charges:    $Therapeutic Activity: 23-37 mins PT General Charges $$ ACUTE PT VISIT: 1 Visit                     Jazen Spraggins R. PTA Acute Rehabilitation Services Office: [phone]   Catalina Antigua 11/14/2022, 11:53 AM

## 2022-11-14 NOTE — Progress Notes (Signed)
    [facility]   Reason for visit: Follow up on peneumonia  Interval History: no acute events, WBC 12.5, no fever. Vancomycin trough 4   Physical Exam: Constitutional:  Vitals:   11/14/22 0720 11/14/22 1521  BP: 118/79 101/69  Pulse: 96 97  Resp: 18 18  Temp: 98.9 F (37.2 C) 97.7 F (36.5 C)  SpO2: 98% 96%   patient appears in NAD Respiratory: Normal respiratory effort  Review of Systems: Constitutional: negative for fevers and chills  Lab Results  Component Value Date   WBC 12.5 (H) 11/14/2022   HGB 10.8 (L) 11/14/2022   HCT 33.0 (L) 11/14/2022   MCV 97.1 11/14/2022   PLT 395 11/14/2022    Lab Results  Component Value Date   CREATININE 0.59 11/14/2022   BUN 15 11/14/2022   NA 134 (L) 11/14/2022   K 4.1 11/14/2022   CL 102 11/14/2022   CO2 23 11/14/2022    Lab Results  Component Value Date   ALT 36 11/13/2022   AST 19 11/13/2022   ALKPHOS 40 11/13/2022     Microbiology: No results found for this or any previous visit (from the past 240 hour(s)).  Impression/Plan:  1. Cavitary pneumonia - + MRSA and on antibiotics. Will transition to oral linezolid and plan for her to continue to complete 4 weeks of antibiotics through July 15.   Will follow up then and can repeat a CXR.    2.  Therapeutic drug monitoring - vancomycin level low at 4.  Will change to linezolid as above.   3.  Leukocytosis - now near normal.  Improved overall.   I will sign off, call with questions.

## 2022-11-14 NOTE — Progress Notes (Signed)
Inpatient Rehab Admissions Coordinator:   Discussed with Dr. Allena Katz this AM.  TEE does not need to be completed.  4 weeks total of IV abx, today is day 15.  She is medically stable for CIR per Dr. Allena Katz, but I do not have a bed for her to admit today.  We will continue to follow for admit pending bed availability in the next few days.   Estill Dooms, PT, DPT Admissions Coordinator [phone] 11/14/22  10:10 AM

## 2022-11-14 NOTE — Progress Notes (Signed)
   11/14/22 1611  Spiritual Encounters  Type of Visit Initial  Care provided to: Pt and family  Referral source Chaplain team  Reason for visit Advance directives  OnCall Visit No  Spiritual Framework  Community/Connection Family  Patient Stress Factors Loss of control  Interventions  Spiritual Care Interventions Made Established relationship of care and support;Compassionate presence;Prayer  Spiritual Care Plan  Spiritual Care Issues Still Outstanding Chaplain will continue to follow   Pt is not able to speak loudly. Pt suffered from a stroke is what the mother told me. Mother is attempting to complete an AD. The Pt is ok with her mother being her HCPOA. I provided the education and will hope to complete the AD tomorrow.

## 2022-11-14 NOTE — Progress Notes (Signed)
[facility] Progress Note Patient: Nicole [name] QMV:[id number] DOB: 1975-10-23 DOA: 10/31/2022  DOS: the patient was seen and examined on 11/14/2022  Brief hospital course: 47 Y/o female with polysubstance abuse, was found unresponsive by her by bystander next-door condemned building.  EMS was notified.  She was hypoxic on EMS arrival, requiring 15 L NRB.  In ED, she was unresponsive, tachypneic, diaphoretic and critically ill, WBCs 43.6, serum osmolality 379, sodium 164, K3.1, creatinine 2.5.  She was intubated for airway protection and hypoxic respiratory failure. Chest x-ray showed right basilar airspace disease.  CT head showed symmetric edema within bilateral globus palladi concerning for ischemic/hypoxic injury. CT chest abdomen pelvis showed right greater than left lower lobe consolidation, nodular groundglass opacities with lucency in the right lower lobe concerning for cavitary infection. Patient was admitted to ICU, started on IV vancomycin and cefepime. Mother reported no history of major medical illnesses. 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  6/18 admitted after being found unresponsive and hypoxic with bilateral pneumonia right greater than left, severe dehydration and hypernatremia, AKI, and concern for possible anoxic injury intubated by EDP antibiotics initiated. 6/19 MRI shows bilateral globus pallidus injury 6/23: Extubated 6/25: Transferred to telemetry level, TRH assumed care  Assessment and Plan: Acute respiratory failure with hypoxia, bilateral pneumonia, cavitary RLL PNA likely aspiration PNA, MRSA pneumonia  -Extubated on 6/23, currently stable on room air -Respiratory cultures positive for MRSA, blood cultures negative so far -ID following, Unasyn DC'd on 6/24, continue linezolid -2D echo showed no valvular vegetation. -CT chest 6/22 showed interval  progression of RUL LLL consolidative opacity with progression of cavitary component now measuring 3.2 cm with layering fluid level compatible with cavitary pneumonia, patchy nodular left lower lobe airspace disease. -Per pulmonology, lobar pneumonia with necrotizing component/abscess, recommended at least 2 weeks of antibiotics, needs follow-up checks x-ray in 8 to 10 weeks with consideration of CT if continues to be abnormal. TEE requested. Originally scheduled for 6/28. Anesthesia had concerns with regards to her recent intubation requirement and therefore recommend to postpone the TEE.  Currently I recommend to continue antibiotic for total 4 weeks and deferring TEE.   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  -Patient was seen by neurosurgery on 6/18, no acute neurosurgical intervention recommended, supportive care, frequent exams, poor prognosis -2D echo showed EF of 60 to 65%, no regional WMA, normal diastolic parameters, RV systolic function normal, no vegetations Appreciate neurology evaluation.  Recommend supportive care. CIR has recommended EEG.  EEG was ordered and negative for any active seizures. Neurology has evaluated the patient on 6/25. Recommended to avoid hypotension. Patient was given IV fluids secondary to hypotension.  Now on midodrine as well.  Remains lethargic on 7/2. Will perform CT of the head to ensure there is no acute abnormality. Patient is not on the medication to cause more confusion. Recent EEG negative for any active seizures as well. Blood pressure actually has improved with  midodrine.   Substance use, cocaine abuse UDS on admission positive for cocaine, alcohol level less than 10, salicylate less than 7.0  Severe sepsis with lactic acidosis, due to pneumonia Patient met SIRS criteria on admission with tachypnea hypotension, hypothermia and leukocytosis with source of infection being MRSA pneumonia. Required ventilator for respiratory failure as organ damage. Did not require pressors.  No evidence of shock. Sepsis physiology has resolved.   AKI with multiple electrolyte abnormalities, anion gap metabolic acidosis Admission serum creatinine 2.57.  Currently less than 1.  No prior baseline available but most likely baseline is normal based on current creatinine. Most likely combination of hypovolemia and ATN. Monitor for now.   hypernatremia On admission serum sodium 165.  Improved with IV hydration.   Hypokalemia hypocalcemia Improved, replace as needed   Mild transaminitis with rhabdomyolysis CK peaked at 900. LFTs peaked at 100.  Both improving.   Left buttock unstageable ulcer, present on admission.   Continue foam dressing.   Subjective: Mother concern the patient is more lethargic.  No nausea vomiting fever no chills.  No other events reported.  Physical Exam: In mild distress. No rash. S1-S2 present. Lethargic but easily awakened.  Able to follow commands.  Muffled voice unchanged since my first interview.  Data Reviewed: I have Reviewed nursing notes, Vitals, and Lab results. Reviewed CBC and BMP.  Reviewed her CBC and BMP.  Disposition: Status is: Inpatient Remains inpatient appropriate because: Requiring IV fluid, midodrine awaiting stability of hemodynamics and performing TEE after that.  enoxaparin (LOVENOX) injection 40 mg Start: 11/01/22 1400 SCDs Start: 10/31/22 1802   Family Communication: Discussed with mother on the bedside Level of care: Telemetry Medical   Vitals:   11/14/22 0118 11/14/22 0436 11/14/22 0720 11/14/22  1521  BP: 105/63 113/82 118/79 101/69  Pulse: 95 95 96 97  Resp: 18 18 18 18   Temp: 99.3 F (37.4 C) 97.7 F (36.5 C) 98.9 F (37.2 C) 97.7 F (36.5 C)  TempSrc: Oral   Oral  SpO2: 98% 100% 98% 96%  Weight:      Height:         Author: Lynden Oxford, MD 11/14/2022 7:13 PM  Please look on [email or web address] to find out who is on call.

## 2022-11-15 ENCOUNTER — Inpatient Hospital Stay (HOSPITAL_COMMUNITY): Payer: MEDICAID

## 2022-11-15 DIAGNOSIS — J9601 Acute respiratory failure with hypoxia: Secondary | ICD-10-CM | POA: Diagnosis not present

## 2022-11-15 DIAGNOSIS — I639 Cerebral infarction, unspecified: Secondary | ICD-10-CM | POA: Diagnosis not present

## 2022-11-15 DIAGNOSIS — J189 Pneumonia, unspecified organism: Secondary | ICD-10-CM | POA: Diagnosis not present

## 2022-11-15 DIAGNOSIS — F1990 Other psychoactive substance use, unspecified, uncomplicated: Secondary | ICD-10-CM | POA: Diagnosis not present

## 2022-11-15 LAB — CBC WITH DIFFERENTIAL/PLATELET
Abs Immature Granulocytes: 0.05 10*3/uL (ref 0.00–0.07)
Basophils Absolute: 0.1 10*3/uL (ref 0.0–0.1)
Basophils Relative: 1 %
Eosinophils Absolute: 0 10*3/uL (ref 0.0–0.5)
Eosinophils Relative: 0 %
HCT: 34 % — ABNORMAL LOW (ref 36.0–46.0)
Hemoglobin: 11.3 g/dL — ABNORMAL LOW (ref 12.0–15.0)
Immature Granulocytes: 1 %
Lymphocytes Relative: 25 %
Lymphs Abs: 2.7 10*3/uL (ref 0.7–4.0)
MCH: 32.5 pg (ref 26.0–34.0)
MCHC: 33.2 g/dL (ref 30.0–36.0)
MCV: 97.7 fL (ref 80.0–100.0)
Monocytes Absolute: 0.7 10*3/uL (ref 0.1–1.0)
Monocytes Relative: 6 %
Neutro Abs: 7.4 10*3/uL (ref 1.7–7.7)
Neutrophils Relative %: 67 %
Platelets: 375 10*3/uL (ref 150–400)
RBC: 3.48 MIL/uL — ABNORMAL LOW (ref 3.87–5.11)
RDW: 12.2 % (ref 11.5–15.5)
WBC: 10.9 10*3/uL — ABNORMAL HIGH (ref 4.0–10.5)
nRBC: 0 % (ref 0.0–0.2)

## 2022-11-15 LAB — COMPREHENSIVE METABOLIC PANEL
ALT: 38 U/L (ref 0–44)
AST: 23 U/L (ref 15–41)
Albumin: 2.2 g/dL — ABNORMAL LOW (ref 3.5–5.0)
Alkaline Phosphatase: 41 U/L (ref 38–126)
Anion gap: 10 (ref 5–15)
BUN: 17 mg/dL (ref 6–20)
CO2: 24 mmol/L (ref 22–32)
Calcium: 8.5 mg/dL — ABNORMAL LOW (ref 8.9–10.3)
Chloride: 102 mmol/L (ref 98–111)
Creatinine, Ser: 0.64 mg/dL (ref 0.44–1.00)
GFR, Estimated: >60 mL/min (ref >60)
Glucose, Bld: 93 mg/dL (ref 70–99)
Potassium: 3.9 mmol/L (ref 3.5–5.1)
Sodium: 136 mmol/L (ref 135–145)
Total Bilirubin: 0.4 mg/dL (ref 0.3–1.2)
Total Protein: 7.4 g/dL (ref 6.5–8.1)

## 2022-11-15 NOTE — Progress Notes (Signed)
Occupational Therapy Treatment Patient Details Name: Nicole [name] MRN: [id number] DOB: [birth year] Today's Date: 11/15/2022   History of present illness 47 y.o. female admitted 6/18 after being found down and unresponsive. She was brought in by EMS and intubated in ED (ETT 6/18-23). Dx includes bilateral pneumonia right greater than left, severe dehydration and hypernatremia, AKI, and concern for possible anoxic injury. MRI head 6/18: "Symmetric signal abnormality within the globi pallidi is most  consistent with a toxic metabolic injury, such as carbon monoxide  poisoning. 2. 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  3. Small amount of bifrontal SAH". PMH includes remote history of drug abuse.   OT comments  Pt demonstrating fair progress toward therapy goals. Pt continues to present with BUE/BLE weakness, decreased balance, and cognition benefiting from functional tasks for initiation. Start of session focused on neck PROM with pt in supine to facilitate neck extension, RN staff encouraged to place pt in supine intermittently throughout day. Pt tolerated sitting EOB for approx 8 minutes with pt able to initiate brief episodes of neck extension with cues. Pt demonstrated ability to complete self-feeding tasks with min A using RUE once positioned with increased time and max cues for initiation. Pt would benefit from continued acute OT services to maximize functional independence and facilitate transition to intensive inpatient follow up therapy, >3 hours/day after discharge.    Recommendations for follow up therapy are one component of a multi-disciplinary discharge planning process, led by the attending physician.  Recommendations may be updated based on patient status, additional functional criteria and insurance authorization.    Assistance Recommended at Discharge Frequent or constant Supervision/Assistance  Patient can return home with the following  Two people to help with  walking and/or transfers;Two people to help with bathing/dressing/bathroom;Assistance with cooking/housework;Direct supervision/assist for medications management;Direct supervision/assist for financial management;Assist for transportation;Help with stairs or ramp for entrance   Equipment Recommendations  Other (comment) (defer)    Recommendations for Other Services      Precautions / Restrictions Precautions Precautions: Fall Precaution Comments: contact precautions Restrictions Weight Bearing Restrictions: No       Mobility Bed Mobility Overal bed mobility: Needs Assistance Bed Mobility: Rolling, Sidelying to Sit Rolling: Total assist, +2 for physical assistance, +2 for safety/equipment Sidelying to sit: Total assist, +2 for physical assistance, +2 for safety/equipment       General bed mobility comments: assistance at trunk and BLEs    Transfers Overall transfer level: Needs assistance Equipment used: 1 person hand held assist Transfers: Bed to chair/wheelchair/BSC, Sit to/from Stand Sit to Stand: Total assist Stand pivot transfers: Total assist         General transfer comment: stand pivot transfer from EOB>chair with total A, STS transfer from chair with total A     Balance Overall balance assessment: Needs assistance Sitting-balance support: Bilateral upper extremity supported, Feet supported Sitting balance-Leahy Scale: Poor Sitting balance - Comments: sitting EOB with mod-max A, max cues to initiate neck extension   Standing balance support: Bilateral upper extremity supported Standing balance-Leahy Scale: Zero Standing balance comment: total A                           ADL either performed or assessed with clinical judgement   ADL Overall ADL's : Needs assistance/impaired Eating/Feeding: Minimal assistance;Sitting Eating/Feeding Details (indicate cue type and reason): assist for package management, cutting food, placing utensil in hand;  required max verbal cues to initiate self-feeding this session.  Grooming: Wash/dry hands;Minimal assistance;Sitting Grooming Details (indicate cue type and reason): washed bilat hands with washcloth with min A for thoroughness and max verbal cues for initiation                 Toilet Transfer: Total assistance;Stand-pivot Toilet Transfer Details (indicate cue type and reason): simulated         Functional mobility during ADLs: Total assistance General ADL Comments: lmited secondary to cognition, generalized weakness, decreased balance    Extremity/Trunk Assessment Upper Extremity Assessment Upper Extremity Assessment: LUE deficits/detail;RUE deficits/detail RUE Deficits / Details: Difficult to assess 2/2 cognition. Pt able to use during functional tasks (self-feeding, grooming) with max cues and increased time. LUE Deficits / Details: Difficult to assess 2/2 cognition. Pt with limited use during functional tasks this session.   Lower Extremity Assessment Lower Extremity Assessment: Defer to PT evaluation        Vision   Vision Assessment?: No apparent visual deficits   Perception Perception Perception: Not tested   Praxis Praxis Praxis: Not tested    Cognition Arousal/Alertness: Awake/alert Behavior During Therapy: Flat affect Overall Cognitive Status: Impaired/Different from baseline Area of Impairment: Orientation, Attention, Following commands, Safety/judgement, Problem solving, Awareness                   Current Attention Level: Focused Memory: Decreased short-term memory Following Commands: Follows one step commands inconsistently, Follows one step commands with increased time Safety/Judgement: Decreased awareness of safety, Decreased awareness of deficits Awareness: Intellectual Problem Solving: Slow processing, Decreased initiation, Difficulty sequencing, Requires verbal cues, Requires tactile cues General Comments: Benefits from simple 1 step  functional tasks with max verbal/tactile cues, limited carryover with nonfunctional commands.        Exercises      Shoulder Instructions       General Comments VSS on RA    Pertinent Vitals/ Pain       Pain Assessment Pain Assessment: Faces Faces Pain Scale: Hurts little more Pain Location: BLEs with PROM Pain Descriptors / Indicators: Discomfort, Grimacing Pain Intervention(s): Limited activity within patient's tolerance, Monitored during session  Home Living                                          Prior Functioning/Environment              Frequency  Min 2X/week        Progress Toward Goals  OT Goals(current goals can now be found in the care plan section)  Progress towards OT goals: Progressing toward goals  Acute Rehab OT Goals Patient Stated Goal: to eat breakfast OT Goal Formulation: With patient Time For Goal Achievement: 11/20/22 Potential to Achieve Goals: Good ADL Goals Pt Will Perform Lower Body Dressing: with mod assist;sit to/from stand Pt Will Transfer to Toilet: with mod assist;bedside commode;stand pivot transfer Additional ADL Goal #1: Pt will utilize LUE as a functional assist during ADL tasks Additional ADL Goal #2: Pt will sequence 2-step ADL task with min cues  Plan Discharge plan remains appropriate;Frequency remains appropriate    Co-evaluation    PT/OT/SLP Co-Evaluation/Treatment: Yes Reason for Co-Treatment: Complexity of the patient's impairments (multi-system involvement);For patient/therapist safety;To address functional/ADL transfers PT goals addressed during session: Mobility/safety with mobility;Balance;Strengthening/ROM OT goals addressed during session: ADL's and self-care      AM-PAC OT "6 Clicks" Daily Activity     Outcome Measure  Help from another person eating meals?: A Little Help from another person taking care of personal grooming?: A Lot Help from another person toileting, which  includes using toliet, bedpan, or urinal?: Total Help from another person bathing (including washing, rinsing, drying)?: A Lot Help from another person to put on and taking off regular upper body clothing?: A Lot Help from another person to put on and taking off regular lower body clothing?: Total 6 Click Score: 11    End of Session Equipment Utilized During Treatment: Gait belt  OT Visit Diagnosis: Unsteadiness on feet (R26.81);Muscle weakness (generalized) (M62.81);Other symptoms and signs involving cognitive function   Activity Tolerance Patient tolerated treatment well   Patient Left in chair;with call bell/phone within reach;with chair alarm set   Nurse Communication Mobility status        Time: 1610-9604 OT Time Calculation (min): 31 min  Charges: OT General Charges $OT Visit: 1 Visit OT Treatments $Self Care/Home Management : 8-22 mins  Sherley Bounds, OTS Acute Rehabilitation Services Office [phone] Secure Chat Communication Preferred   Sherley Bounds 11/15/2022, 10:30 AM

## 2022-11-15 NOTE — Progress Notes (Signed)
Physical Therapy Treatment Patient Details Name: Nicole [name] MRN: [id number] DOB: 1976/01/19 Today's Date: 11/15/2022   History of Present Illness 47 y.o. female admitted 6/18 after being found down and unresponsive. She was brought in by EMS and intubated in ED (ETT 6/18-23). Dx includes bilateral pneumonia right greater than left, severe dehydration and hypernatremia, AKI, and concern for possible anoxic injury. MRI head 6/18: "Symmetric signal abnormality within the globi pallidi is most  consistent with a toxic metabolic injury, such as carbon monoxide  poisoning. 2. 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  3. Small amount of bifrontal SAH". PMH includes remote history of drug abuse.    PT Comments   Pt greeted resting in bed and agreeable to session. Pt placed supine to facilitate cervical extension at start of session as pt with noted difficult extending neck in sitting and tightness throughout cervical musculature. Discussed with RN and encouraged this placement throughout day. Pt continues to require +2 total A to come to sitting EOB, with pt able to briefly maintain static sitting with min guard. Pt needing max A to stand pivot to chair at end of session. Current plan remains appropriate to address deficits and maximize functional independence and decrease caregiver burden. Pt continues to benefit from skilled PT services to progress toward functional mobility goals.      Assistance Recommended at Discharge Frequent or constant Supervision/Assistance  If plan is discharge home, recommend the following:  Can travel by private vehicle    Two people to help with walking and/or transfers;Two people to help with bathing/dressing/bathroom      Equipment Recommendations  None recommended by PT    Recommendations for Other Services       Precautions / Restrictions Precautions Precautions: Fall Precaution Comments: contact precautions Restrictions Weight Bearing  Restrictions: No     Mobility  Bed Mobility Overal bed mobility: Needs Assistance Bed Mobility: Rolling, Sidelying to Sit Rolling: Total assist, +2 for physical assistance, +2 for safety/equipment Sidelying to sit: Total assist, +2 for physical assistance, +2 for safety/equipment       General bed mobility comments: assistance at trunk and BLEs    Transfers Overall transfer level: Needs assistance Equipment used: 1 person hand held assist Transfers: Bed to chair/wheelchair/BSC, Sit to/from Stand Sit to Stand: Total assist Stand pivot transfers: Total assist         General transfer comment: stand pivot transfer from EOB>chair with total A, STS transfer from chair with total A    Ambulation/Gait                   Stairs             Wheelchair Mobility     Tilt Bed    Modified Rankin (Stroke Patients Only)       Balance Overall balance assessment: Needs assistance Sitting-balance support: Bilateral upper extremity supported, Feet supported Sitting balance-Leahy Scale: Poor Sitting balance - Comments: sitting EOB with mod-max A, max cues to initiate neck extension   Standing balance support: Bilateral upper extremity supported Standing balance-Leahy Scale: Zero Standing balance comment: total A                            Cognition Arousal/Alertness: Awake/alert Behavior During Therapy: Flat affect Overall Cognitive Status: Impaired/Different from baseline Area of Impairment: Orientation, Attention, Following commands, Safety/judgement, Problem solving, Awareness  Current Attention Level: Focused Memory: Decreased short-term memory Following Commands: Follows one step commands inconsistently, Follows one step commands with increased time Safety/Judgement: Decreased awareness of safety, Decreased awareness of deficits Awareness: Intellectual Problem Solving: Slow processing, Decreased initiation, Difficulty  sequencing, Requires verbal cues, Requires tactile cues General Comments: Benefits from simple 1 step functional tasks with max verbal/tactile cues, limited carryover with nonfunctional commands.        Exercises      General Comments General comments (skin integrity, edema, etc.): VSS on RA      Pertinent Vitals/Pain Pain Assessment Pain Assessment: Faces Faces Pain Scale: Hurts little more Pain Location: BLEs with PROM Pain Descriptors / Indicators: Discomfort, Grimacing Pain Intervention(s): Monitored during session, Limited activity within patient's tolerance, Repositioned    Home Living                          Prior Function            PT Goals (current goals can now be found in the care plan section) Acute Rehab PT Goals PT Goal Formulation: With patient/family Time For Goal Achievement: 11/20/22 Progress towards PT goals: Progressing toward goals    Frequency    Min 4X/week      PT Plan Current plan remains appropriate    Co-evaluation PT/OT/SLP Co-Evaluation/Treatment: Yes Reason for Co-Treatment: Complexity of the patient's impairments (multi-system involvement);For patient/therapist safety;To address functional/ADL transfers PT goals addressed during session: Mobility/safety with mobility;Balance;Strengthening/ROM OT goals addressed during session: ADL's and self-care      AM-PAC PT "6 Clicks" Mobility   Outcome Measure  Help needed turning from your back to your side while in a flat bed without using bedrails?: Total Help needed moving from lying on your back to sitting on the side of a flat bed without using bedrails?: Total Help needed moving to and from a bed to a chair (including a wheelchair)?: Total Help needed standing up from a chair using your arms (e.g., wheelchair or bedside chair)?: Total Help needed to walk in hospital room?: Total Help needed climbing 3-5 steps with a railing? : Total 6 Click Score: 6    End of Session  Equipment Utilized During Treatment: Gait belt Activity Tolerance: Patient tolerated treatment well;Patient limited by fatigue Patient left: with call bell/phone within reach;with chair alarm set;in chair Nurse Communication: Mobility status PT Visit Diagnosis: Other abnormalities of gait and mobility (R26.89);Muscle weakness (generalized) (M62.81);Hemiplegia and hemiparesis Hemiplegia - Right/Left: Left Hemiplegia - dominant/non-dominant: Non-dominant Hemiplegia - caused by: Unspecified;Cerebral infarction     Time: 0827-0854 PT Time Calculation (min) (ACUTE ONLY): 27 min  Charges:    $Therapeutic Activity: 8-22 mins PT General Charges $$ ACUTE PT VISIT: 1 Visit                     Dee Maday R. PTA Acute Rehabilitation Services Office: [phone]   Catalina Antigua 11/15/2022, 1:15 PM

## 2022-11-15 NOTE — Progress Notes (Signed)
Chaplain received page requesting chaplain and notary present to pt room. Chaplain explained that notaries are not always available and presented to Ms Glowacki's room to further assess needs. Chaplain made introduction to pt's mother, [name], at her bedside. Pt was sitting up in the chair with her head down and did not rouse for the majority of the visit. Lor's mother shared that she met with a chaplain intern yesterday and had reviewed HCPOA with her daughter. She said that Brayla goes in and out of alertness and her cognitive state also seems to vacilate, though it was her impression that Haydon understood the document and was in agreement with her mother serving as her health care agent, however she is frequently "mute."  Chaplain verbalized concern that patient may not be in a cognitive state for the [employer] and Witnesses to confidently assure that she wanted to complete the documentation or that we could find a proxy comfortable signing for her when she could not make it clear that she wanted to complete these documents. However, upon further review, Chaplain was able to assure [name], as her mother that she would be the default health care agent as the patient has no appointed guardian or attorney, is divorced, has no children, and her father is deceased. Her mother, [name], stated that brings her some comfort.   Chaplain utilized reflective listening to offer support to pt's mother as she is grieving her daughter's significant medical crisis and decline and is aware that they both assumed that Roaa would be caring for her mother's medical needs rather than the opposite. Chaplain normalized these feelings and conveyed that she may also benefit from the completion of an Advance Directive. Chaplain also communicated via the medical chart with unit social worker to request someone from the team follow up with Loren's mother to help her identify what other legal needs she may need to manage in order to  continue caring for her daughter while she is in this state. Chaplain explored strategies for self care and encouraged [name] to continue to practice self care and compassion.   [name] was able to rouse her daughter enough to inquire if she would want to have prayer. Pt made a soft moan, which her mother interpreted as affirmation. I offered prayer in the family's tradition at their request.    Maryanna Shape. Carley Hammed, M.Div. [facility] Chaplain Pager [phone] Office [phone]

## 2022-11-15 NOTE — Progress Notes (Signed)
Speech Language Pathology Treatment: Dysphagia;Cognitive-Linquistic  Patient Details Name: Nicole [name] MRN: [id number] DOB: 1976-03-18 Today's Date: 11/15/2022 Time: 7829-5621 SLP Time Calculation (min) (ACUTE ONLY): 36 min  Assessment / Plan / Recommendation Clinical Impression  Session focused on both swallow and cognitive linguistic goals.  Note per recent chest imaging, improvement documented.  Pt in room sitting up in chair with nearly full breakfast tray in front of her.  Food was cold and pt only consumed an extra single bite of sausage with applesauce, 3 peaches, 1 ounce applesauce alone, approx 4 ounces Ensure, 3 ounces of juice and 4 ounces of thin water.  Prolonged oral manipulation noted with pt's swallow followed by immediate cough post-swallow - continued for approx 7 seconds - Pt admits to coughing/choking with solids more than liquids - states there were premorbid deficits via yes/no question.  She was able to hold her own cup and consume Ensure, juice and water. With large sequential swallows, pt demonstrates occasional coughing - concerning for airway infiltration.  With small single boluses and moist food *peaches*, pt able to manage solids without coughing.  She will benefit from full supervision with her meals for both initiation of swallowing/intake as well as safety due to her impulsivity and taking large sips.    Cognitive linguistic goals addressed with pt, she sitting upright in chair with chin down position.  Responded to SLP with ongoing delays for verbalization - with pt responding approx 30% of opportunities.  Cued to hold head up, look at SLP and providing 2 verbal choices improved pt's expression. She only spoke in single words or short phrases to answer SLP.  Pt benefited from maximum verbal cues to breath in deeply and then speak to improve phonation strength, despite effort she was able to phonation only minimally above a whisper.  Using teach back, reviewed phonation  strength helps with airway protection and swallowing.   SLP questions pt's motivation to participate in therapy and/or impaired attention from her metabolic injury and bilateral corona and bifrontal SAH.    Modified goals to meet pt's current status, no family present at this time.      HPI HPI: 47 y.o. female admitted 6/18 after being found down and unresponsive. She was brought in by EMS and intubated in ED (ETT 6/18-23). Dx includes bilateral pneumonia right greater than left, severe dehydration and hypernatremia, AKI, and concern for possible anoxic injury. MRI head 6/18: "Symmetric signal abnormality within the globi pallidi is most  consistent with a toxic metabolic injury, such as carbon monoxide  poisoning. 2. 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  3. 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      SLP Plan  Continue with current plan of care      Recommendations for follow up therapy are one component of a multi-disciplinary discharge planning process, led by the attending physician.  Recommendations may be updated based on patient status, additional functional criteria and insurance authorization.    Recommendations  Diet recommendations: Dysphagia 3 (mechanical soft);Thin liquid Liquids provided via: Cup;Straw Medication Administration: Whole meds with puree Supervision: Staff to assist with self feeding (and cues for small single sips please) Compensations: Minimize environmental distractions Postural Changes and/or Swallow Maneuvers: Seated upright 90 degrees;Out of bed for meals  Oral care BID   Frequent or constant Supervision/Assistance Cognitive communication deficit (R41.841);Dysphagia, oral phase (R13.11)     Continue  with current plan of care   Rolena Infante, MS [facility] SLP Acute Rehab Services Office [phone]   Chales Abrahams  11/15/2022, 9:57 AM

## 2022-11-15 NOTE — Progress Notes (Signed)
PROGRESS NOTE  Nicole [name] NWG:[id number] DOB: Sep 03, 1975   PCP: Georganna Skeans, MD  Patient is from:   DOA: 10/31/2022 LOS: 15  Chief complaints Chief Complaint  Patient presents with   unresponsive     Brief Narrative / Interim history: 47 year old F with PMH of polysubstance use brought to ED by EMS after found unresponsive by bystander next-door and admitted for hypoxic respiratory failure and bilateral pneumonia, hyponatremia (164), AKI and stroke.  UDS positive for cocaine.  CXR showed right basilar airspace disease.  CT chest, abdomen and pelvis showed bilateral lower lobe consolidation, nodular GGO opacities with lucency in RLL concerning for cavitary infection. CT head and MRI brain showed symmetric edema within bilateral globus pallidus concerning for ischemic/hypoxic injury.  Patient was initially intubated and admitted to ICU.  Eventually improved and extubated on 6/23.  Transition to hospital service on 6/25.    Subjective: Seen and examined earlier this morning.  Sitting on bedside chair.  Awake but not alert.  Oriented to self and follows some commands.  Does not converse or engage much.  Follows some commands.  Objective: Vitals:   11/14/22 1958 11/15/22 0400 11/15/22 0500 11/15/22 0800  BP: 102/83 105/70  116/82  Pulse: 94 94  (!) 108  Resp: 16 16    Temp: 98.8 F (37.1 C) 98.8 F (37.1 C)  97.9 F (36.6 C)  TempSrc: Oral Oral    SpO2: 100% 96%  100%  Weight:   48.2 kg   Height:        Examination:  GENERAL: No apparent distress.  Nontoxic. HEENT: MMM.  Vision and hearing grossly intact.  NECK: Supple.  No apparent JVD.  RESP:  No IWOB.  Fair aeration bilaterally. CVS:  RRR. Heart sounds normal.  ABD/GI/GU: BS+. Abd soft, NTND.  MSK/EXT:  Moves extremities. No apparent deformity. No edema.  SKIN: no apparent skin lesion or wound NEURO: Awake but not quite alert.  Oriented to self.  Follows some commands.  Limited exam due to mental status and patient's  inability to follow commands. PSYCH: Calm. Normal affect.   Procedures:  Intubation and mechanical ventilation 6/18-6/23  Microbiology summarized:   Assessment and plan: Principal Problem:   Acute respiratory failure (HCC) Active Problems:   Cavitary pneumonia   Substance use  Severe sepsis due to bilateral pneumonia: POA. Acute respiratory failure with hypoxia due to bilateral pneumonia Cavitary RLL pneumonia likely due to aspiration pneumonia MRSA pneumonia: Respiratory culture positive for MRSA. -CXR and CT chest as above. -ETT 6/18-6/23.  Now on room air. -Continue Zyvox per ID. -Appreciate input by PCCM.  Antibiotics at least for 2 weeks followed by repeat imaging in 6 to 8 weeks  Acute CVA: CT head and MRI brain showed bilateral globus pallidus injury with bilateral coronary radiata watershed infarcts.  TTE without significant finding. -Neurology recommended avoiding hypotension   Small bifrontal subarachnoid hemorrhage: Noted on MRI on 6/18. -Supportive care   Acute metabolic encephalopathy: Multifactorial including pneumonia, hypoxemia, CVA and cocaine use.  Patient is awake but not quite alert.  Follows some commands.  Has limited neuroexam due to mental status.  EEG negative for seizure or epileptiform discharge. -Treat treatable causes -Reorientation and delirium precaution   Substance use, cocaine abuse: UDS positive. -Encourage cessation -Provided resources     AKI: Cr 12.57 on admission.  Multifactorial including ATN in the setting of severe sepsis, hypotension, polysubstance use.Marland Kitchen  Resolved. Recent Labs    11/06/22 0540 11/07/22 0110 11/08/22 0315 11/09/22  0423 11/10/22 0359 11/11/22 0249 11/12/22 1015 11/13/22 1226 11/14/22 0409 11/15/22 0423  BUN 12 14 19  24* 24* 26* 17 12 15 17   CREATININE 0.71 0.78 0.87 0.75 0.87 0.71 0.55 0.44 0.59 0.64   hypernatremia: Resolved. On admission serum sodium 165.  Improved with IV hydration.   Hypokalemia  hypocalcemia -Monitor replenish as appropriate   Mild transaminitis with rhabdomyolysis   Inadequate oral intake Body mass index is 20.08 kg/m. Nutrition Problem: Inadequate oral intake Etiology: inability to eat Signs/Symptoms: NPO status Interventions: Refer to RD note for recommendations  Left buttock unstageable ulcer: POA Pressure Injury 10/31/22 Buttocks Left Unstageable - Full thickness tissue loss in which the base of the injury is covered by slough (yellow, tan, gray, green or brown) and/or eschar (tan, brown or black) in the wound bed. (Active)  10/31/22 2000  Location: Buttocks  Location Orientation: Left  Staging: Unstageable - Full thickness tissue loss in which the base of the injury is covered by slough (yellow, tan, gray, green or brown) and/or eschar (tan, brown or black) in the wound bed.  Wound Description (Comments):   Present on Admission: Yes  Dressing Type Foam - Lift dressing to assess site every shift 11/14/22 2000     Pressure Injury 11/05/22 Head Posterior;Upper Unstageable - Full thickness tissue loss in which the base of the injury is covered by slough (yellow, tan, gray, green or brown) and/or eschar (tan, brown or black) in the wound bed. (Active)  11/05/22 2200  Location: Head  Location Orientation: Posterior;Upper  Staging: Unstageable - Full thickness tissue loss in which the base of the injury is covered by slough (yellow, tan, gray, green or brown) and/or eschar (tan, brown or black) in the wound bed.  Wound Description (Comments):   Present on Admission:   Dressing Type None 11/12/22 0800   DVT prophylaxis:  enoxaparin (LOVENOX) injection 40 mg Start: 11/01/22 1400 SCDs Start: 10/31/22 1802  Code Status: Full code Family Communication: None at bedside Level of care: Telemetry Medical Status is: Inpatient Remains inpatient appropriate because: Safe disposition/CIR   Final disposition: CIR Consultants:   Neurology Neurosurgery ID Pulmonology  35 minutes with more than 50% spent in reviewing records, counseling patient/family and coordinating care.   Sch Meds:  Scheduled Meds:  Chlorhexidine Gluconate Cloth  6 each Topical Q0600   enoxaparin (LOVENOX) injection  40 mg Subcutaneous Q24H   feeding supplement  237 mL Oral BID BM   leptospermum manuka honey  1 Application Topical Daily   linezolid  600 mg Oral Q12H   midodrine  5 mg Oral TID WC   multivitamin with minerals  1 tablet Oral Daily   sodium chloride flush  10-40 mL Intracatheter Q12H   Continuous Infusions:  sodium chloride Stopped (11/05/22 1633)   PRN Meds:.sodium chloride, acetaminophen, ondansetron (ZOFRAN) IV, mouth rinse, pentafluoroprop-tetrafluoroeth, polyethylene glycol, sodium chloride flush  Antimicrobials: Anti-infectives (From admission, onward)    Start     Dose/Rate Route Frequency Ordered Stop   11/14/22 1545  linezolid (ZYVOX) tablet 600 mg        600 mg Oral Every 12 hours 11/14/22 1454 12/03/22 1759   11/12/22 0100  vancomycin (VANCOCIN) 500 mg in sodium chloride 0.9 % 100 mL IVPB  Status:  Discontinued        500 mg 100 mL/hr over 60 Minutes Intravenous Every 12 hours 11/11/22 1214 11/14/22 1454   11/11/22 1300  Vancomycin (VANCOCIN) 1,250 mg in sodium chloride 0.9 % 250 mL IVPB  1,250 mg 166.7 mL/hr over 90 Minutes Intravenous  Once 11/11/22 1214 11/11/22 1440   11/10/22 1745  vancomycin (VANCOCIN) 500 mg in sodium chloride 0.9 % 100 mL IVPB  Status:  Discontinued        500 mg 100 mL/hr over 60 Minutes Intravenous Every 12 hours 11/10/22 1649 11/11/22 1214   11/07/22 2200  linezolid (ZYVOX) tablet 600 mg  Status:  Discontinued        600 mg Oral Every 12 hours 11/07/22 1009 11/10/22 1326   11/06/22 1200  Ampicillin-Sulbactam (UNASYN) 3 g in sodium chloride 0.9 % 100 mL IVPB  Status:  Discontinued        3 g 200 mL/hr over 30 Minutes Intravenous Every 6 hours 11/06/22 0757 11/06/22 1334    11/04/22 2200  linezolid (ZYVOX) IVPB 600 mg  Status:  Discontinued        600 mg 300 mL/hr over 60 Minutes Intravenous Every 12 hours 11/04/22 1524 11/07/22 1009   11/01/22 1500  ceFEPIme (MAXIPIME) 2 g in sodium chloride 0.9 % 100 mL IVPB  Status:  Discontinued        2 g 200 mL/hr over 30 Minutes Intravenous Every 24 hours 10/31/22 1531 11/01/22 1139   11/01/22 1300  doxycycline (VIBRA-TABS) tablet 100 mg  Status:  Discontinued        100 mg Oral Every 12 hours 11/01/22 1209 11/01/22 1211   11/01/22 1300  Ampicillin-Sulbactam (UNASYN) 3 g in sodium chloride 0.9 % 100 mL IVPB  Status:  Discontinued        3 g 200 mL/hr over 30 Minutes Intravenous Every 8 hours 11/01/22 1209 11/06/22 0752   11/01/22 1300  doxycycline (VIBRA-TABS) tablet 100 mg  Status:  Discontinued        100 mg Per Tube Every 12 hours 11/01/22 1211 11/04/22 1524   11/01/22 1230  ceFEPIme (MAXIPIME) 2 g in sodium chloride 0.9 % 100 mL IVPB  Status:  Discontinued        2 g 200 mL/hr over 30 Minutes Intravenous Every 12 hours 11/01/22 1139 11/01/22 1209   11/01/22 0600  metroNIDAZOLE (FLAGYL) IVPB 500 mg  Status:  Discontinued        500 mg 100 mL/hr over 60 Minutes Intravenous Every 12 hours 10/31/22 1823 11/01/22 1209   10/31/22 1815  metroNIDAZOLE (FLAGYL) IVPB 500 mg  Status:  Discontinued        500 mg 100 mL/hr over 60 Minutes Intravenous Every 12 hours 10/31/22 1807 10/31/22 1823   10/31/22 1531  vancomycin variable dose per unstable renal function (pharmacist dosing)  Status:  Discontinued         Does not apply See admin instructions 10/31/22 1531 11/01/22 1209   10/31/22 1530  ceFEPIme (MAXIPIME) 2 g in sodium chloride 0.9 % 100 mL IVPB        2 g 200 mL/hr over 30 Minutes Intravenous  Once 10/31/22 1518 10/31/22 1620   10/31/22 1530  metroNIDAZOLE (FLAGYL) IVPB 500 mg        500 mg 100 mL/hr over 60 Minutes Intravenous  Once 10/31/22 1518 10/31/22 1647   10/31/22 1530  vancomycin (VANCOCIN) IVPB 1000  mg/200 mL premix  Status:  Discontinued        1,000 mg 200 mL/hr over 60 Minutes Intravenous  Once 10/31/22 1518 10/31/22 1522   10/31/22 1530  vancomycin (VANCOREADY) IVPB 1500 mg/300 mL        1,500 mg 150 mL/hr over 120 Minutes  Intravenous  Once 10/31/22 1522 10/31/22 1943        I have personally reviewed the following labs and images: CBC: Recent Labs  Lab 11/14/22 1526 11/15/22 0423  WBC 12.5* 10.9*  NEUTROABS 9.5* 7.4  HGB 10.8* 11.3*  HCT 33.0* 34.0*  MCV 97.1 97.7  PLT 395 375   BMP &GFR Recent Labs  Lab 11/09/22 0423 11/10/22 0359 11/11/22 0249 11/12/22 1015 11/13/22 1226 11/14/22 0409 11/15/22 0423  NA 133* 138 133* 134* 138 134* 136  K 3.1* 4.1 4.0 3.2* 2.9* 4.1 3.9  CL 100 106 101 102 114* 102 102  CO2 26 26 24 22  20* 23 24  GLUCOSE 155* 109* 88 150* 98 99 93  BUN 24* 24* 26* 17 12 15 17   CREATININE 0.75 0.87 0.71 0.55 0.44 0.59 0.64  CALCIUM 7.9* 8.2* 8.2* 7.7* 6.6* 8.3* 8.5*  MG 2.0 2.0 2.0  --  1.4* 2.2  --   PHOS 4.3  --   --   --   --   --   --    Estimated Creatinine Clearance: 67 mL/min (by C-G formula based on SCr of 0.64 mg/dL). Liver & Pancreas: Recent Labs  Lab 11/13/22 1303 11/15/22 0423  AST 19 23  ALT 36 38  ALKPHOS 40 41  BILITOT 0.4 0.4  PROT 5.7* 7.4  ALBUMIN <1.5* 2.2*   No results for input(s): "LIPASE", "AMYLASE" in the last 168 hours. No results for input(s): "AMMONIA" in the last 168 hours. Diabetic: No results for input(s): "HGBA1C" in the last 72 hours. Recent Labs  Lab 11/09/22 0225 11/09/22 0804 11/09/22 1152 11/09/22 2008 11/11/22 1956  GLUCAP 101* 84 188* 113* 114*   Cardiac Enzymes: No results for input(s): "CKTOTAL", "CKMB", "CKMBINDEX", "TROPONINI" in the last 168 hours. No results for input(s): "PROBNP" in the last 8760 hours. Coagulation Profile: No results for input(s): "INR", "PROTIME" in the last 168 hours. Thyroid Function Tests: No results for input(s): "TSH", "T4TOTAL", "FREET4",  "T3FREE", "THYROIDAB" in the last 72 hours. Lipid Profile: No results for input(s): "CHOL", "HDL", "LDLCALC", "TRIG", "CHOLHDL", "LDLDIRECT" in the last 72 hours. Anemia Panel: No results for input(s): "VITAMINB12", "FOLATE", "FERRITIN", "TIBC", "IRON", "RETICCTPCT" in the last 72 hours. Urine analysis:    Component Value Date/Time   COLORURINE AMBER (A) 10/31/2022 1618   APPEARANCEUR TURBID (A) 10/31/2022 1618   LABSPEC 1.024 10/31/2022 1618   PHURINE 5.0 10/31/2022 1618   GLUCOSEU NEGATIVE 10/31/2022 1618   HGBUR MODERATE (A) 10/31/2022 1618   BILIRUBINUR NEGATIVE 10/31/2022 1618   KETONESUR NEGATIVE 10/31/2022 1618   PROTEINUR 100 (A) 10/31/2022 1618   NITRITE NEGATIVE 10/31/2022 1618   LEUKOCYTESUR MODERATE (A) 10/31/2022 1618   Sepsis Labs: Invalid input(s): "PROCALCITONIN", "LACTICIDVEN"  Microbiology: No results found for this or any previous visit (from the past 240 hour(s)).  Radiology Studies: CT HEAD WO CONTRAST ( )  Result Date: 11/15/2022 CLINICAL DATA:  Mental status change with unknown cause EXAM: CT HEAD WITHOUT CONTRAST TECHNIQUE: Contiguous axial images were obtained from the base of the skull through the vertex without intravenous contrast. RADIATION DOSE REDUCTION: This exam was performed according to the departmental dose-optimization program which includes automated exposure control, adjustment of the mA and/or kV according to patient size and/or use of iterative reconstruction technique. COMPARISON:  Brain MRI 10/31/2022 FINDINGS: Brain: Cerebral and globus pallidus insults on comparison brain MRI are essentially occult. No evidence of acute infarction, hemorrhage, hydrocephalus, or mass. Vascular: No hyperdense vessel or unexpected calcification. Skull: Normal.  Negative for fracture or focal lesion. Sinuses/Orbits: No acute finding. IMPRESSION: Abnormalities on recent brain MRI are largely occult. No evidence of acute or interval insult. Electronically Signed    By: Tiburcio Pea M.D.   On: 11/15/2022 07:14   DG CHEST PORT 1 VIEW  Result Date: 11/14/2022 CLINICAL DATA:  Shortness of breath. EXAM: PORTABLE CHEST 1 VIEW COMPARISON:  Chest radiograph dated 11/03/2022 and CT dated 11/04/2022. FINDINGS: Right-sided PICC with tip at the cavoatrial junction. There is trace right pleural effusion and minimal right lung base atelectasis or infiltrate. Overall significant interval improvement of right lung base density since the prior radiograph. The left lung is clear. No pneumothorax. The cardiac silhouette is within normal limits. No acute osseous pathology. IMPRESSION: Trace right pleural effusion and minimal right lung base atelectasis or infiltrate. Electronically Signed   By: Elgie Collard M.D.   On: 11/14/2022 18:09      Margaretann Abate T. Danaysia Rader [facility]  If 7PM-7AM, please contact night-coverage [email or web address] 11/15/2022, 2:20 PM

## 2022-11-15 NOTE — Progress Notes (Addendum)
Inpatient Rehab Admissions Coordinator:   Note pt's insurance transitioned to [employer], which is a managed medicaid plan.  Will look into whether or not prior auth is required, and will start that process if so.  Will not have a bed for this patient on CIR today.  Will continue to follow.   1121: Prior auth required for new medicaid plan.  Auth requested.  Will follow.   Estill Dooms, PT, DPT Admissions Coordinator [phone] 11/15/22  10:26 AM

## 2022-11-16 DIAGNOSIS — I639 Cerebral infarction, unspecified: Secondary | ICD-10-CM | POA: Diagnosis not present

## 2022-11-16 DIAGNOSIS — J189 Pneumonia, unspecified organism: Secondary | ICD-10-CM | POA: Diagnosis not present

## 2022-11-16 DIAGNOSIS — F1990 Other psychoactive substance use, unspecified, uncomplicated: Secondary | ICD-10-CM | POA: Diagnosis not present

## 2022-11-16 DIAGNOSIS — J9601 Acute respiratory failure with hypoxia: Secondary | ICD-10-CM | POA: Diagnosis not present

## 2022-11-16 MED ORDER — MIDODRINE HCL 5 MG PO TABS
5.0000 mg | ORAL_TABLET | Freq: Three times a day (TID) | ORAL | Status: DC
  Administered 2022-11-16 – 2022-11-18 (×6): 5 mg via ORAL
  Filled 2022-11-16 (×6): qty 1

## 2022-11-16 MED ORDER — LINEZOLID 600 MG PO TABS
600.0000 mg | ORAL_TABLET | Freq: Two times a day (BID) | ORAL | Status: DC
  Administered 2022-11-16 – 2022-11-18 (×4): 600 mg via ORAL
  Filled 2022-11-16 (×4): qty 1

## 2022-11-16 NOTE — Progress Notes (Signed)
Inpatient Rehab Admissions Coordinator:   Awaiting determination from insurance for CIR prior auth request.   Estill Dooms, PT, DPT Admissions Coordinator [phone] 11/16/22  8:52 AM

## 2022-11-16 NOTE — Progress Notes (Signed)
Speech Language Pathology Treatment: Dysphagia  Patient Details Name: Nicole [name] MRN: [id number] DOB: 06/04/1975 Today's Date: 11/16/2022 Time: 4098-1191 SLP Time Calculation (min) (ACUTE ONLY): 40 min  Assessment / Plan / Recommendation Clinical Impression  Pt seen for dysphagia treatment focusing on consumption of meal.  Today she is awake but requires moderate assist to put food to her mouth on fork, spoon after SLP loaded it.  Her head continues in flexion position, but she can bring it up when she desires to drink - otherwise SLP facilitated position by holding head up, trying rolled towel, etc.  Pt continues with prolonged mastication and occasional cough with liquids only - no observed with solids.  Her tolerance of po intake was much improved today - although intake is poor and pt required 35 minutes to consume 1/2 pancakes, 3 ounces juice and single bite of sausage.  Question component of sadness with current state along with cognitive deficits including attention to tasks.    Recommend to maximize her liquid nutrition - ie. Ensures to assure she is obtaining calories as this is most efficient for her.    Spoke to RN re: concerns -- and posted swallow precaution sign that indicated pt had to be fed - tray left in front of her yesterday with nothing touched - as she is not initiating.    Voice nearly aphonic today and pt only attempted to speak x2 of approx 10 ? Asked by SLP even with 2 choice *yes,no*.   She was more participative yesterday than today and recommend maximize communication, po when pt accepting.      HPI HPI: 47 y.o. female admitted 6/18 after being found down and unresponsive. She was brought in by EMS and intubated in ED (ETT 6/18-23). Dx includes bilateral pneumonia right greater than left, severe dehydration and hypernatremia, AKI, and concern for possible anoxic injury. MRI head 6/18: "Symmetric signal abnormality within the globi pallidi is most  consistent with a toxic  metabolic injury, such as carbon monoxide  poisoning. 2. 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  3. 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      SLP Plan  Continue with current plan of care      Recommendations for follow up therapy are one component of a multi-disciplinary discharge planning process, led by the attending physician.  Recommendations may be updated based on patient status, additional functional criteria and insurance authorization.    Recommendations  Diet recommendations: Dysphagia 3 (mechanical soft);Thin liquid Liquids provided via: Cup;Straw Medication Administration: Whole meds with puree Supervision: Staff to assist with self feeding (and cues for small single sips please) Compensations: Minimize environmental distractions Postural Changes and/or Swallow Maneuvers: Upright 30-60 min after meal                  Oral care BID   Frequent or constant Supervision/Assistance Dysphagia, oral phase (R13.11)     Continue with current plan of care    Rolena Infante, MS [facility] SLP Acute Rehab Services Office [phone]  Chales Abrahams  11/16/2022, 8:45 AM

## 2022-11-16 NOTE — Progress Notes (Signed)
PROGRESS NOTE  Nicole [name] WUJ:[id number] DOB: 10-Sep-1975   PCP: Georganna Skeans, MD  Patient is from:   DOA: 10/31/2022 LOS: 16  Chief complaints Chief Complaint  Patient presents with   unresponsive     Brief Narrative / Interim history: 47 year old F with PMH of polysubstance use brought to ED by EMS after found unresponsive by bystander next-door and admitted for hypoxic respiratory failure and bilateral pneumonia, hyponatremia (164), AKI and stroke.  UDS positive for cocaine.  CXR showed right basilar airspace disease.  CT chest, abdomen and pelvis showed bilateral lower lobe consolidation, nodular GGO opacities with lucency in RLL concerning for cavitary infection. CT head and MRI brain showed symmetric edema within bilateral globus pallidus concerning for ischemic/hypoxic injury.  Patient was initially intubated and admitted to ICU.  Eventually improved and extubated on 6/23.  Transitioned to hospital service on 6/25.  Therapy recommended CIR.    Subjective: Seen and examined earlier this morning.  No major events overnight of this morning.  Patient is awake and alert but minimally interactive.  She is oriented to self.  Was not answering other orientation questions.  Follows some commands.  Objective: Vitals:   11/15/22 1616 11/15/22 2027 11/16/22 0526 11/16/22 0532  BP: 104/75 109/82 119/87   Pulse: 96 91 95   Resp:  15 16   Temp: 98.9 F (37.2 C) 98 F (36.7 C) 98.6 F (37 C)   TempSrc: Oral Oral Oral   SpO2: 97% 93% 100%   Weight:    46.5 kg  Height:        Examination:  GENERAL: No apparent distress.  Nontoxic. HEENT: MMM.  Vision and hearing grossly intact.  NECK: Supple.  No apparent JVD.  RESP:  No IWOB.  Fair aeration bilaterally. CVS:  RRR. Heart sounds normal.  ABD/GI/GU: BS+. Abd soft, NTND.  MSK/EXT: Moves upper extremities but weak on the left.  Not moving BLE. SKIN: no apparent skin lesion or wound NEURO: Awake but not quite alert.  Oriented to  self.  Follows some commands.  Extremity weakness.  Inability to follow commands. PSYCH: Calm.  Flat affect?  Procedures:  Intubation and mechanical ventilation 6/18-6/23  Microbiology summarized:   Assessment and plan: Principal Problem:   Acute respiratory failure (HCC) Active Problems:   Cavitary pneumonia   Substance use  Severe sepsis due to bilateral pneumonia: POA. Acute respiratory failure with hypoxia due to bilateral pneumonia Cavitary RLL pneumonia likely due to aspiration pneumonia MRSA pneumonia: Respiratory culture positive for MRSA. -CXR and CT chest as above. -ETT 6/18-6/23.  Now on room air. -ID recommends oral Zyvox to complete 4 weeks of antibiotics through July 15 -PCCM recommends repeat imaging in 6 to 8 weeks  Acute CVA: CT head and MRI brain showed bilateral globus pallidus injury with bilateral coronary radiata watershed infarcts.  TTE without significant finding. -Neurology recommended avoiding hypotension.  Continue midodrine 5 mg 3 times daily  Small bifrontal subarachnoid hemorrhage: Noted on MRI on 6/18. -Supportive care   Acute metabolic encephalopathy: Multifactorial including pneumonia, hypoxemia, CVA and cocaine use.  Patient is awake but not quite alert.  Follows some commands.  Has limited neuroexam due to mental status.  EEG negative for seizure or epileptiform discharge. -Treat treatable causes -Reorientation and delirium precaution   Substance use, cocaine abuse: UDS positive. -Encourage cessation when able to complaint -Provided resources   AKI: Cr 12.57 on admission.  Multifactorial including ATN due to severe sepsis, hypotension, polysubstance use.  Resolved. Recent  Labs    11/06/22 0540 11/07/22 0110 11/08/22 0315 11/09/22 0423 11/10/22 0359 11/11/22 0249 11/12/22 1015 11/13/22 1226 11/14/22 0409 11/15/22 0423  BUN 12 14 19  24* 24* 26* 17 12 15 17   CREATININE 0.71 0.78 0.87 0.75 0.87 0.71 0.55 0.44 0.59 0.64    Hypernatremia: Resolved. On admission serum sodium 165.  Improved with IV hydration.  Hypotension: Improved. -Continue midodrine to avoid further watershed stroke   Hypokalemia hypocalcemia -Monitor replenish as appropriate   Mild transaminitis with rhabdomyolysis: Resolved   Inadequate oral intake Body mass index is 19.37 kg/m. Nutrition Problem: Inadequate oral intake Etiology: inability to eat Signs/Symptoms: NPO status Interventions: Refer to RD note for recommendations  Left buttock unstageable ulcer: POA Pressure Injury 10/31/22 Buttocks Left Unstageable - Full thickness tissue loss in which the base of the injury is covered by slough (yellow, tan, gray, green or brown) and/or eschar (tan, brown or black) in the wound bed. (Active)  10/31/22 2000  Location: Buttocks  Location Orientation: Left  Staging: Unstageable - Full thickness tissue loss in which the base of the injury is covered by slough (yellow, tan, gray, green or brown) and/or eschar (tan, brown or black) in the wound bed.  Wound Description (Comments):   Present on Admission: Yes  Dressing Type Foam - Lift dressing to assess site every shift;Honey 11/16/22 0609   DVT prophylaxis:  enoxaparin (LOVENOX) injection 40 mg Start: 11/01/22 1400 SCDs Start: 10/31/22 1802  Code Status: Full code Family Communication: None at bedside Level of care: Telemetry Medical Status is: Inpatient Remains inpatient appropriate because: Safe disposition/CIR   Final disposition: CIR Consultants:  Neurology Neurosurgery ID Pulmonology  35 minutes with more than 50% spent in reviewing records, counseling patient/family and coordinating care.   Sch Meds:  Scheduled Meds:  Chlorhexidine Gluconate Cloth  6 each Topical Q0600   enoxaparin (LOVENOX) injection  40 mg Subcutaneous Q24H   feeding supplement  237 mL Oral BID BM   leptospermum manuka honey  1 Application Topical Daily   linezolid  600 mg Oral Q12H    multivitamin with minerals  1 tablet Oral Daily   sodium chloride flush  10-40 mL Intracatheter Q12H   Continuous Infusions:  sodium chloride Stopped (11/05/22 1633)   PRN Meds:.sodium chloride, acetaminophen, ondansetron (ZOFRAN) IV, mouth rinse, pentafluoroprop-tetrafluoroeth, polyethylene glycol, sodium chloride flush  Antimicrobials: Anti-infectives (From admission, onward)    Start     Dose/Rate Route Frequency Ordered Stop   11/14/22 1545  linezolid (ZYVOX) tablet 600 mg        600 mg Oral Every 12 hours 11/14/22 1454 12/03/22 1759   11/12/22 0100  vancomycin (VANCOCIN) 500 mg in sodium chloride 0.9 % 100 mL IVPB  Status:  Discontinued        500 mg 100 mL/hr over 60 Minutes Intravenous Every 12 hours 11/11/22 1214 11/14/22 1454   11/11/22 1300  Vancomycin (VANCOCIN) 1,250 mg in sodium chloride 0.9 % 250 mL IVPB        1,250 mg 166.7 mL/hr over 90 Minutes Intravenous  Once 11/11/22 1214 11/11/22 1440   11/10/22 1745  vancomycin (VANCOCIN) 500 mg in sodium chloride 0.9 % 100 mL IVPB  Status:  Discontinued        500 mg 100 mL/hr over 60 Minutes Intravenous Every 12 hours 11/10/22 1649 11/11/22 1214   11/07/22 2200  linezolid (ZYVOX) tablet 600 mg  Status:  Discontinued        600 mg Oral Every 12 hours 11/07/22  1009 11/10/22 1326   11/06/22 1200  Ampicillin-Sulbactam (UNASYN) 3 g in sodium chloride 0.9 % 100 mL IVPB  Status:  Discontinued        3 g 200 mL/hr over 30 Minutes Intravenous Every 6 hours 11/06/22 0757 11/06/22 1334   11/04/22 2200  linezolid (ZYVOX) IVPB 600 mg  Status:  Discontinued        600 mg 300 mL/hr over 60 Minutes Intravenous Every 12 hours 11/04/22 1524 11/07/22 1009   11/01/22 1500  ceFEPIme (MAXIPIME) 2 g in sodium chloride 0.9 % 100 mL IVPB  Status:  Discontinued        2 g 200 mL/hr over 30 Minutes Intravenous Every 24 hours 10/31/22 1531 11/01/22 1139   11/01/22 1300  doxycycline (VIBRA-TABS) tablet 100 mg  Status:  Discontinued        100 mg Oral  Every 12 hours 11/01/22 1209 11/01/22 1211   11/01/22 1300  Ampicillin-Sulbactam (UNASYN) 3 g in sodium chloride 0.9 % 100 mL IVPB  Status:  Discontinued        3 g 200 mL/hr over 30 Minutes Intravenous Every 8 hours 11/01/22 1209 11/06/22 0752   11/01/22 1300  doxycycline (VIBRA-TABS) tablet 100 mg  Status:  Discontinued        100 mg Per Tube Every 12 hours 11/01/22 1211 11/04/22 1524   11/01/22 1230  ceFEPIme (MAXIPIME) 2 g in sodium chloride 0.9 % 100 mL IVPB  Status:  Discontinued        2 g 200 mL/hr over 30 Minutes Intravenous Every 12 hours 11/01/22 1139 11/01/22 1209   11/01/22 0600  metroNIDAZOLE (FLAGYL) IVPB 500 mg  Status:  Discontinued        500 mg 100 mL/hr over 60 Minutes Intravenous Every 12 hours 10/31/22 1823 11/01/22 1209   10/31/22 1815  metroNIDAZOLE (FLAGYL) IVPB 500 mg  Status:  Discontinued        500 mg 100 mL/hr over 60 Minutes Intravenous Every 12 hours 10/31/22 1807 10/31/22 1823   10/31/22 1531  vancomycin variable dose per unstable renal function (pharmacist dosing)  Status:  Discontinued         Does not apply See admin instructions 10/31/22 1531 11/01/22 1209   10/31/22 1530  ceFEPIme (MAXIPIME) 2 g in sodium chloride 0.9 % 100 mL IVPB        2 g 200 mL/hr over 30 Minutes Intravenous  Once 10/31/22 1518 10/31/22 1620   10/31/22 1530  metroNIDAZOLE (FLAGYL) IVPB 500 mg        500 mg 100 mL/hr over 60 Minutes Intravenous  Once 10/31/22 1518 10/31/22 1647   10/31/22 1530  vancomycin (VANCOCIN) IVPB 1000 mg/200 mL premix  Status:  Discontinued        1,000 mg 200 mL/hr over 60 Minutes Intravenous  Once 10/31/22 1518 10/31/22 1522   10/31/22 1530  vancomycin (VANCOREADY) IVPB 1500 mg/300 mL        1,500 mg 150 mL/hr over 120 Minutes Intravenous  Once 10/31/22 1522 10/31/22 1943        I have personally reviewed the following labs and images: CBC: Recent Labs  Lab 11/14/22 1526 11/15/22 0423  WBC 12.5* 10.9*  NEUTROABS 9.5* 7.4  HGB 10.8* 11.3*   HCT 33.0* 34.0*  MCV 97.1 97.7  PLT 395 375   BMP &GFR Recent Labs  Lab 11/10/22 0359 11/11/22 0249 11/12/22 1015 11/13/22 1226 11/14/22 0409 11/15/22 0423  NA 138 133* 134* 138 134* 136  K 4.1 4.0 3.2* 2.9* 4.1 3.9  CL 106 101 102 114* 102 102  CO2 26 24 22  20* 23 24  GLUCOSE 109* 88 150* 98 99 93  BUN 24* 26* 17 12 15 17   CREATININE 0.87 0.71 0.55 0.44 0.59 0.64  CALCIUM 8.2* 8.2* 7.7* 6.6* 8.3* 8.5*  MG 2.0 2.0  --  1.4* 2.2  --    Estimated Creatinine Clearance: 65.2 mL/min (by C-G formula based on SCr of 0.64 mg/dL). Liver & Pancreas: Recent Labs  Lab 11/13/22 1303 11/15/22 0423  AST 19 23  ALT 36 38  ALKPHOS 40 41  BILITOT 0.4 0.4  PROT 5.7* 7.4  ALBUMIN <1.5* 2.2*   No results for input(s): "LIPASE", "AMYLASE" in the last 168 hours. No results for input(s): "AMMONIA" in the last 168 hours. Diabetic: No results for input(s): "HGBA1C" in the last 72 hours. Recent Labs  Lab 11/09/22 2008 11/11/22 1956  GLUCAP 113* 114*   Cardiac Enzymes: No results for input(s): "CKTOTAL", "CKMB", "CKMBINDEX", "TROPONINI" in the last 168 hours. No results for input(s): "PROBNP" in the last 8760 hours. Coagulation Profile: No results for input(s): "INR", "PROTIME" in the last 168 hours. Thyroid Function Tests: No results for input(s): "TSH", "T4TOTAL", "FREET4", "T3FREE", "THYROIDAB" in the last 72 hours. Lipid Profile: No results for input(s): "CHOL", "HDL", "LDLCALC", "TRIG", "CHOLHDL", "LDLDIRECT" in the last 72 hours. Anemia Panel: No results for input(s): "VITAMINB12", "FOLATE", "FERRITIN", "TIBC", "IRON", "RETICCTPCT" in the last 72 hours. Urine analysis:    Component Value Date/Time   COLORURINE AMBER (A) 10/31/2022 1618   APPEARANCEUR TURBID (A) 10/31/2022 1618   LABSPEC 1.024 10/31/2022 1618   PHURINE 5.0 10/31/2022 1618   GLUCOSEU NEGATIVE 10/31/2022 1618   HGBUR MODERATE (A) 10/31/2022 1618   BILIRUBINUR NEGATIVE 10/31/2022 1618   KETONESUR NEGATIVE  10/31/2022 1618   PROTEINUR 100 (A) 10/31/2022 1618   NITRITE NEGATIVE 10/31/2022 1618   LEUKOCYTESUR MODERATE (A) 10/31/2022 1618   Sepsis Labs: Invalid input(s): "PROCALCITONIN", "LACTICIDVEN"  Microbiology: No results found for this or any previous visit (from the past 240 hour(s)).  Radiology Studies: No results found.    Ehan Freas T. Reino Lybbert [facility]  If 7PM-7AM, please contact night-coverage [email or web address] 11/16/2022, 2:46 PM

## 2022-11-16 NOTE — Plan of Care (Signed)

## 2022-11-17 ENCOUNTER — Inpatient Hospital Stay (HOSPITAL_COMMUNITY): Admit: 2022-11-17 | Payer: MEDICAID | Source: Intra-hospital | Admitting: Physical Medicine and Rehabilitation

## 2022-11-17 DIAGNOSIS — J9601 Acute respiratory failure with hypoxia: Secondary | ICD-10-CM | POA: Diagnosis not present

## 2022-11-17 DIAGNOSIS — I639 Cerebral infarction, unspecified: Secondary | ICD-10-CM | POA: Diagnosis not present

## 2022-11-17 DIAGNOSIS — J189 Pneumonia, unspecified organism: Secondary | ICD-10-CM | POA: Diagnosis not present

## 2022-11-17 DIAGNOSIS — F1990 Other psychoactive substance use, unspecified, uncomplicated: Secondary | ICD-10-CM | POA: Diagnosis not present

## 2022-11-17 NOTE — Progress Notes (Signed)
PROGRESS NOTE  ZOA FONDREN KGM:[id number] DOB: 02/19/1976   PCP: Georganna Skeans, MD  Patient is from:   DOA: 10/31/2022 LOS: 16  Chief complaints Chief Complaint  Patient presents with   unresponsive     Brief Narrative / Interim history: 47 year old F with PMH of polysubstance use brought to ED by EMS after found unresponsive by bystander next-door and admitted for hypoxic respiratory failure and bilateral pneumonia, hyponatremia (164), AKI and stroke.  UDS positive for cocaine.  CXR showed right basilar airspace disease.  CT chest, abdomen and pelvis showed bilateral lower lobe consolidation, nodular GGO opacities with lucency in RLL concerning for cavitary infection. CT head and MRI brain showed symmetric edema within bilateral globus pallidus concerning for ischemic/hypoxic injury.  Patient was initially intubated and admitted to ICU.  Eventually improved and extubated on 6/23.  Transitioned to hospital service on 6/25.  Therapy recommended CIR. CIR following    Subjective: Seen and examined earlier this morning.  No major events overnight of this morning.  Lying in bed with neck pain to the left.  Minimally interactive.  Follows some commands.  Moves upper extremities.  Does not move lower extremity.  Objective: Vitals:   11/15/22 1616 11/15/22 2027 11/16/22 0526 11/16/22 0532  BP: 104/75 109/82 119/87   Pulse: 96 91 95   Resp:  15 16   Temp: 98.9 F (37.2 C) 98 F (36.7 C) 98.6 F (37 C)   TempSrc: Oral Oral Oral   SpO2: 97% 93% 100%   Weight:    46.5 kg  Height:        Examination:  GENERAL: No apparent distress.  Nontoxic. HEENT: MMM.  Vision and hearing grossly intact.  NECK: Supple.  No apparent JVD.  RESP:  No IWOB.  Fair aeration bilaterally. CVS:  RRR. Heart sounds normal.  ABD/GI/GU: BS+. Abd soft, NTND.  MSK/EXT: Moves upper extremities.  Lower extremities in not moving. SKIN: no apparent skin lesion or wound NEURO: Awake but not quite alert.  Oriented  to self.  Follows some commands.  Extremity weakness.  Inability to follow commands. PSYCH: Calm.  Flat affect?  Procedures:  Intubation and mechanical ventilation 6/18-6/23  Microbiology summarized:   Assessment and plan: Principal Problem:   Acute respiratory failure (HCC) Active Problems:   Cavitary pneumonia   Substance use  Severe sepsis due to bilateral pneumonia: POA. Acute respiratory failure with hypoxia due to bilateral pneumonia Cavitary RLL pneumonia likely due to aspiration pneumonia MRSA pneumonia: Respiratory culture positive for MRSA. -CXR and CT chest as above. -ETT 6/18-6/23.  Now on room air. -ID recommends oral Zyvox to complete 4 weeks of antibiotics through July 15 -PCCM recommends repeat imaging in 6 to 8 weeks  Acute CVA: CT head and MRI brain showed bilateral globus pallidus injury with bilateral coronary radiata watershed infarcts.  TTE without significant finding. -Neurology recommended avoiding hypotension.  Continue midodrine 5 mg 3 times daily  Small bifrontal subarachnoid hemorrhage: Noted on MRI on 6/18. -Supportive care   Acute metabolic encephalopathy: Multifactorial including pneumonia, hypoxemia, CVA and cocaine use.  Patient is awake but not quite alert.  Follows some commands.  Has limited neuroexam due to mental status.  EEG negative for seizure or epileptiform discharge. -Treat treatable causes -Reorientation and delirium precaution   Substance use, cocaine abuse: UDS positive. -Encourage cessation when able to complaint -Provided resources   AKI: Cr 12.57 on admission.  Multifactorial including ATN due to severe sepsis, hypotension, polysubstance use.  Resolved. Recent  Labs    11/06/22 0540 11/07/22 0110 11/08/22 0315 11/09/22 0423 11/10/22 0359 11/11/22 0249 11/12/22 1015 11/13/22 1226 11/14/22 0409 11/15/22 0423  BUN 12 14 19  24* 24* 26* 17 12 15 17   CREATININE 0.71 0.78 0.87 0.75 0.87 0.71 0.55 0.44 0.59 0.64    Hypernatremia: Resolved. On admission serum sodium 165.  Improved with IV hydration.  Hypotension: Improved. -Continue midodrine to avoid further watershed stroke   Hypokalemia hypocalcemia -Monitor replenish as appropriate   Mild transaminitis with rhabdomyolysis: Resolved   Inadequate oral intake Body mass index is 19.37 kg/m. Nutrition Problem: Inadequate oral intake Etiology: inability to eat Signs/Symptoms: NPO status Interventions: Refer to RD note for recommendations  Left buttock unstageable ulcer: POA Pressure Injury 10/31/22 Buttocks Left Unstageable - Full thickness tissue loss in which the base of the injury is covered by slough (yellow, tan, gray, green or brown) and/or eschar (tan, brown or black) in the wound bed. (Active)  10/31/22 2000  Location: Buttocks  Location Orientation: Left  Staging: Unstageable - Full thickness tissue loss in which the base of the injury is covered by slough (yellow, tan, gray, green or brown) and/or eschar (tan, brown or black) in the wound bed.  Wound Description (Comments):   Present on Admission: Yes  Dressing Type Foam - Lift dressing to assess site every shift;Honey 11/16/22 0609   DVT prophylaxis:  enoxaparin (LOVENOX) injection 40 mg Start: 11/01/22 1400 SCDs Start: 10/31/22 1802  Code Status: Full code Family Communication: None at bedside Level of care: Telemetry Medical Status is: Inpatient Remains inpatient appropriate because: Safe disposition/CIR   Final disposition: CIR Consultants:  Neurology Neurosurgery ID Pulmonology  35 minutes with more than 50% spent in reviewing records, counseling patient/family and coordinating care.   Sch Meds:  Scheduled Meds:  Chlorhexidine Gluconate Cloth  6 each Topical Q0600   enoxaparin (LOVENOX) injection  40 mg Subcutaneous Q24H   feeding supplement  237 mL Oral BID BM   leptospermum manuka honey  1 Application Topical Daily   linezolid  600 mg Oral Q12H    multivitamin with minerals  1 tablet Oral Daily   sodium chloride flush  10-40 mL Intracatheter Q12H   Continuous Infusions:  sodium chloride Stopped (11/05/22 1633)   PRN Meds:.sodium chloride, acetaminophen, ondansetron (ZOFRAN) IV, mouth rinse, pentafluoroprop-tetrafluoroeth, polyethylene glycol, sodium chloride flush  Antimicrobials: Anti-infectives (From admission, onward)    Start     Dose/Rate Route Frequency Ordered Stop   11/14/22 1545  linezolid (ZYVOX) tablet 600 mg        600 mg Oral Every 12 hours 11/14/22 1454 12/03/22 1759   11/12/22 0100  vancomycin (VANCOCIN) 500 mg in sodium chloride 0.9 % 100 mL IVPB  Status:  Discontinued        500 mg 100 mL/hr over 60 Minutes Intravenous Every 12 hours 11/11/22 1214 11/14/22 1454   11/11/22 1300  Vancomycin (VANCOCIN) 1,250 mg in sodium chloride 0.9 % 250 mL IVPB        1,250 mg 166.7 mL/hr over 90 Minutes Intravenous  Once 11/11/22 1214 11/11/22 1440   11/10/22 1745  vancomycin (VANCOCIN) 500 mg in sodium chloride 0.9 % 100 mL IVPB  Status:  Discontinued        500 mg 100 mL/hr over 60 Minutes Intravenous Every 12 hours 11/10/22 1649 11/11/22 1214   11/07/22 2200  linezolid (ZYVOX) tablet 600 mg  Status:  Discontinued        600 mg Oral Every 12 hours 11/07/22  1009 11/10/22 1326   11/06/22 1200  Ampicillin-Sulbactam (UNASYN) 3 g in sodium chloride 0.9 % 100 mL IVPB  Status:  Discontinued        3 g 200 mL/hr over 30 Minutes Intravenous Every 6 hours 11/06/22 0757 11/06/22 1334   11/04/22 2200  linezolid (ZYVOX) IVPB 600 mg  Status:  Discontinued        600 mg 300 mL/hr over 60 Minutes Intravenous Every 12 hours 11/04/22 1524 11/07/22 1009   11/01/22 1500  ceFEPIme (MAXIPIME) 2 g in sodium chloride 0.9 % 100 mL IVPB  Status:  Discontinued        2 g 200 mL/hr over 30 Minutes Intravenous Every 24 hours 10/31/22 1531 11/01/22 1139   11/01/22 1300  doxycycline (VIBRA-TABS) tablet 100 mg  Status:  Discontinued        100 mg Oral  Every 12 hours 11/01/22 1209 11/01/22 1211   11/01/22 1300  Ampicillin-Sulbactam (UNASYN) 3 g in sodium chloride 0.9 % 100 mL IVPB  Status:  Discontinued        3 g 200 mL/hr over 30 Minutes Intravenous Every 8 hours 11/01/22 1209 11/06/22 0752   11/01/22 1300  doxycycline (VIBRA-TABS) tablet 100 mg  Status:  Discontinued        100 mg Per Tube Every 12 hours 11/01/22 1211 11/04/22 1524   11/01/22 1230  ceFEPIme (MAXIPIME) 2 g in sodium chloride 0.9 % 100 mL IVPB  Status:  Discontinued        2 g 200 mL/hr over 30 Minutes Intravenous Every 12 hours 11/01/22 1139 11/01/22 1209   11/01/22 0600  metroNIDAZOLE (FLAGYL) IVPB 500 mg  Status:  Discontinued        500 mg 100 mL/hr over 60 Minutes Intravenous Every 12 hours 10/31/22 1823 11/01/22 1209   10/31/22 1815  metroNIDAZOLE (FLAGYL) IVPB 500 mg  Status:  Discontinued        500 mg 100 mL/hr over 60 Minutes Intravenous Every 12 hours 10/31/22 1807 10/31/22 1823   10/31/22 1531  vancomycin variable dose per unstable renal function (pharmacist dosing)  Status:  Discontinued         Does not apply See admin instructions 10/31/22 1531 11/01/22 1209   10/31/22 1530  ceFEPIme (MAXIPIME) 2 g in sodium chloride 0.9 % 100 mL IVPB        2 g 200 mL/hr over 30 Minutes Intravenous  Once 10/31/22 1518 10/31/22 1620   10/31/22 1530  metroNIDAZOLE (FLAGYL) IVPB 500 mg        500 mg 100 mL/hr over 60 Minutes Intravenous  Once 10/31/22 1518 10/31/22 1647   10/31/22 1530  vancomycin (VANCOCIN) IVPB 1000 mg/200 mL premix  Status:  Discontinued        1,000 mg 200 mL/hr over 60 Minutes Intravenous  Once 10/31/22 1518 10/31/22 1522   10/31/22 1530  vancomycin (VANCOREADY) IVPB 1500 mg/300 mL        1,500 mg 150 mL/hr over 120 Minutes Intravenous  Once 10/31/22 1522 10/31/22 1943        I have personally reviewed the following labs and images: CBC: Recent Labs  Lab 11/14/22 1526 11/15/22 0423  WBC 12.5* 10.9*  NEUTROABS 9.5* 7.4  HGB 10.8* 11.3*   HCT 33.0* 34.0*  MCV 97.1 97.7  PLT 395 375   BMP &GFR Recent Labs  Lab 11/10/22 0359 11/11/22 0249 11/12/22 1015 11/13/22 1226 11/14/22 0409 11/15/22 0423  NA 138 133* 134* 138 134* 136  K 4.1 4.0 3.2* 2.9* 4.1 3.9  CL 106 101 102 114* 102 102  CO2 26 24 22  20* 23 24  GLUCOSE 109* 88 150* 98 99 93  BUN 24* 26* 17 12 15 17   CREATININE 0.87 0.71 0.55 0.44 0.59 0.64  CALCIUM 8.2* 8.2* 7.7* 6.6* 8.3* 8.5*  MG 2.0 2.0  --  1.4* 2.2  --    Estimated Creatinine Clearance: 65.2 mL/min (by C-G formula based on SCr of 0.64 mg/dL). Liver & Pancreas: Recent Labs  Lab 11/13/22 1303 11/15/22 0423  AST 19 23  ALT 36 38  ALKPHOS 40 41  BILITOT 0.4 0.4  PROT 5.7* 7.4  ALBUMIN <1.5* 2.2*   No results for input(s): "LIPASE", "AMYLASE" in the last 168 hours. No results for input(s): "AMMONIA" in the last 168 hours. Diabetic: No results for input(s): "HGBA1C" in the last 72 hours. Recent Labs  Lab 11/09/22 2008 11/11/22 1956  GLUCAP 113* 114*   Cardiac Enzymes: No results for input(s): "CKTOTAL", "CKMB", "CKMBINDEX", "TROPONINI" in the last 168 hours. No results for input(s): "PROBNP" in the last 8760 hours. Coagulation Profile: No results for input(s): "INR", "PROTIME" in the last 168 hours. Thyroid Function Tests: No results for input(s): "TSH", "T4TOTAL", "FREET4", "T3FREE", "THYROIDAB" in the last 72 hours. Lipid Profile: No results for input(s): "CHOL", "HDL", "LDLCALC", "TRIG", "CHOLHDL", "LDLDIRECT" in the last 72 hours. Anemia Panel: No results for input(s): "VITAMINB12", "FOLATE", "FERRITIN", "TIBC", "IRON", "RETICCTPCT" in the last 72 hours. Urine analysis:    Component Value Date/Time   COLORURINE AMBER (A) 10/31/2022 1618   APPEARANCEUR TURBID (A) 10/31/2022 1618   LABSPEC 1.024 10/31/2022 1618   PHURINE 5.0 10/31/2022 1618   GLUCOSEU NEGATIVE 10/31/2022 1618   HGBUR MODERATE (A) 10/31/2022 1618   BILIRUBINUR NEGATIVE 10/31/2022 1618   KETONESUR NEGATIVE  10/31/2022 1618   PROTEINUR 100 (A) 10/31/2022 1618   NITRITE NEGATIVE 10/31/2022 1618   LEUKOCYTESUR MODERATE (A) 10/31/2022 1618   Sepsis Labs: Invalid input(s): "PROCALCITONIN", "LACTICIDVEN"  Microbiology: No results found for this or any previous visit (from the past 240 hour(s)).  Radiology Studies: No results found.    Arval Brandstetter T. Saquoia Sianez [facility]  If 7PM-7AM, please contact night-coverage [email or web address] 11/16/2022, 2:46 PM

## 2022-11-17 NOTE — TOC Progression Note (Signed)
Transition of Care [facility]) - Progression Note    Patient Details  Name: Nicole [name] MRN: [id number] Date of Birth: 17-Mar-1976  Transition of [facility]) CM/SW Contact  Janae Bridgeman, RN Phone Number: 11/17/2022, 1:47 PM  Clinical Narrative:    CM continues to follow the patient for [facility] needs - CIR is following the patient at this time for possible CIR admission - pending medical stability and insurance authorization.        Expected Discharge Plan and Services                                               Social Determinants of Health (SDOH) Interventions SDOH Screenings   Food Insecurity: Food Insecurity Present (11/13/2022)  Housing: High Risk (11/13/2022)  Transportation Needs: Unmet Transportation Needs (11/13/2022)  Utilities: At Risk (11/13/2022)  Tobacco Use: Low Risk  (11/13/2022)    Readmission Risk Interventions     No data to display

## 2022-11-17 NOTE — Plan of Care (Signed)

## 2022-11-17 NOTE — Progress Notes (Signed)
Physical Therapy Treatment Patient Details Name: Nicole [name] MRN: [id number] DOB: Aug 31, 1975 Today's Date: 11/17/2022   History of Present Illness 47 y.o. female admitted 6/18 after being found down and unresponsive. She was brought in by EMS and intubated in ED (ETT 6/18-23). Dx includes bilateral pneumonia right greater than left, severe dehydration and hypernatremia, AKI, and concern for possible anoxic injury. MRI head 6/18: "Symmetric signal abnormality within the globi pallidi is most  consistent with a toxic metabolic injury, such as carbon monoxide  poisoning. 2. 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  3. Small amount of bifrontal SAH". PMH includes remote history of drug abuse.    PT Comments  Pt required total assist bed mobility, and +2 total SPT bed to recliner. PROM/stretching into extension BUE/LE and neck. Pt in recliner, reclined with feet elevated, at end of session. Pillows, towel rolls used to promote midline and extended positioning.      Assistance Recommended at Discharge Frequent or constant Supervision/Assistance  If plan is discharge home, recommend the following:  Can travel by private vehicle    Two people to help with walking and/or transfers;Two people to help with bathing/dressing/bathroom      Equipment Recommendations  None recommended by PT    Recommendations for Other Services       Precautions / Restrictions Precautions Precautions: Fall;Other (comment) Precaution Comments: contact precautions     Mobility  Bed Mobility Overal bed mobility: Needs Assistance Bed Mobility: Supine to Sit     Supine to sit: Total assist, HOB elevated     General bed mobility comments: assist at trunk and BLE    Transfers Overall transfer level: Needs assistance   Transfers: Bed to chair/wheelchair/BSC       Squat pivot transfers: +2 physical assistance, Total assist     General transfer comment: squat pivot bed to recliner  toward R. Assist provided at gait belt and bed pad under hips    Ambulation/Gait                   Stairs             Wheelchair Mobility     Tilt Bed    Modified Rankin (Stroke Patients Only) Modified Rankin (Stroke Patients Only) Pre-Morbid Rankin Score: No symptoms Modified Rankin: Severe disability     Balance Overall balance assessment: Needs assistance Sitting-balance support: Feet supported, No upper extremity supported Sitting balance-Leahy Scale: Poor Sitting balance - Comments: mod/max assist to maintain balance EOB                                    Cognition Arousal/Alertness: Awake/alert Behavior During Therapy: Flat affect Overall Cognitive Status: Impaired/Different from baseline Area of Impairment: Orientation, Attention, Following commands, Safety/judgement, Problem solving, Awareness, Memory                 Orientation Level: Disoriented to, Place, Time, Situation Current Attention Level: Focused Memory: Decreased short-term memory Following Commands: Follows one step commands inconsistently, Follows one step commands with increased time Safety/Judgement: Decreased awareness of safety, Decreased awareness of deficits Awareness: Intellectual Problem Solving: Slow processing, Decreased initiation, Difficulty sequencing, Requires verbal cues, Requires tactile cues General Comments: no verbalizations. Able to make eye contact on command.        Exercises Other Exercises Other Exercises: PROM/stretching BUE/LE and neck    General Comments General comments (skin integrity, edema, etc.): HR  in 80s      Pertinent Vitals/Pain Pain Assessment Pain Assessment: Faces Faces Pain Scale: Hurts little more Pain Location: neck, BLE with PROM/stretching Pain Descriptors / Indicators: Grimacing Pain Intervention(s): Limited activity within patient's tolerance, Monitored during session, Repositioned    Home Living                           Prior Function            PT Goals (current goals can now be found in the care plan section) Acute Rehab PT Goals Patient Stated Goal: per mother, return to PLOF Progress towards PT goals: Progressing toward goals    Frequency    Min 4X/week      PT Plan Current plan remains appropriate    Co-evaluation              AM-PAC PT "6 Clicks" Mobility   Outcome Measure  Help needed turning from your back to your side while in a flat bed without using bedrails?: Total Help needed moving from lying on your back to sitting on the side of a flat bed without using bedrails?: Total Help needed moving to and from a bed to a chair (including a wheelchair)?: Total Help needed standing up from a chair using your arms (e.g., wheelchair or bedside chair)?: Total Help needed to walk in hospital room?: Total Help needed climbing 3-5 steps with a railing? : Total 6 Click Score: 6    End of Session Equipment Utilized During Treatment: Gait belt Activity Tolerance: Patient tolerated treatment well Patient left: in chair;with call bell/phone within reach;with chair alarm set Nurse Communication: Mobility status PT Visit Diagnosis: Other abnormalities of gait and mobility (R26.89);Muscle weakness (generalized) (M62.81);Hemiplegia and hemiparesis Hemiplegia - Right/Left: Left Hemiplegia - dominant/non-dominant: Non-dominant Hemiplegia - caused by: Unspecified;Cerebral infarction     Time: 4098-1191 PT Time Calculation (min) (ACUTE ONLY): 27 min  Charges:    $Therapeutic Activity: 23-37 mins PT General Charges $$ ACUTE PT VISIT: 1 Visit                     Ferd Glassing., PT  Office # [phone]    Ilda Foil 11/17/2022, 11:10 AM

## 2022-11-17 NOTE — Plan of Care (Signed)
Wound Plan     Wounds present: Left buttock unstageable ulcer: POA     Pressure Injury 10/31/22 Buttocks Left Unstageable - Full thickness tissue loss in which the base of the injury is covered by slough (yellow, tan, gray, green or brown) and/or eschar (tan, brown or black) in the wound bed. (Active)  10/31/22 2000  Location: Buttocks  Location Orientation: Left  Staging: Unstageable - Full thickness tissue loss in which the base of the injury is covered by slough (yellow, tan, gray, green or brown) and/or eschar (tan, brown or black) in the wound bed.  Wound Description (Comments):   Present on Admission: Yes        Interventions:  Dressing Type Foam - Lift dressing to assess site every shift;Honey   Air mattress High calorie/high protein diet MVI daily  Ensure Enlive BID  Braden Score: 14  Sensory: 3 Moisture: 3 Activity: 2 Mobility: 3 Nutrition: 2 Friction: 1  Contributors: Dawn Sylvie Farrier MSN, RN, CWOCN, CWCN-AP, CNS

## 2022-11-17 NOTE — Plan of Care (Signed)
  Problem: Clinical Measurements: Goal: Ability to maintain clinical measurements within normal limits will improve Outcome: Progressing Goal: Will remain free from infection Outcome: Progressing Goal: Diagnostic test results will improve Outcome: Progressing Goal: Respiratory complications will improve Outcome: Progressing Goal: Cardiovascular complication will be avoided Outcome: Progressing   Problem: Activity: Goal: Risk for activity intolerance will decrease Outcome: Progressing   Problem: Nutrition: Goal: Adequate nutrition will be maintained Outcome: Progressing   Problem: Coping: Goal: Level of anxiety will decrease Outcome: Progressing   Problem: Elimination: Goal: Will not experience complications related to bowel motility Outcome: Progressing   Problem: Pain Managment: Goal: General experience of comfort will improve Outcome: Progressing   Problem: Safety: Goal: Ability to remain free from injury will improve Outcome: Progressing   Problem: Skin Integrity: Goal: Risk for impaired skin integrity will decrease Outcome: Progressing   Problem: Education: Goal: Knowledge of General Education information will improve Description: Including pain rating scale, medication(s)/side effects and non-pharmacologic comfort measures Outcome: Not Progressing   Problem: Health Behavior/Discharge Planning: Goal: Ability to manage health-related needs will improve Outcome: Not Progressing   Problem: Safety: Goal: Non-violent Restraint(s) Outcome: Not Applicable

## 2022-11-18 ENCOUNTER — Encounter (HOSPITAL_COMMUNITY): Payer: Self-pay | Admitting: Physical Medicine and Rehabilitation

## 2022-11-18 ENCOUNTER — Inpatient Hospital Stay (HOSPITAL_COMMUNITY)
Admission: RE | Admit: 2022-11-18 | Discharge: 2023-07-19 | DRG: 052 | Disposition: A | Discharge status: Skilled Nursing Facility | Payer: MEDICAID | Source: Intra-hospital | Attending: Physical Medicine and Rehabilitation | Admitting: Physical Medicine and Rehabilitation

## 2022-11-18 ENCOUNTER — Other Ambulatory Visit: Payer: Self-pay

## 2022-11-18 ENCOUNTER — Encounter: Payer: Self-pay | Admitting: Family Medicine

## 2022-11-18 DIAGNOSIS — L309 Dermatitis, unspecified: Secondary | ICD-10-CM | POA: Diagnosis not present

## 2022-11-18 DIAGNOSIS — F1910 Other psychoactive substance abuse, uncomplicated: Secondary | ICD-10-CM | POA: Diagnosis not present

## 2022-11-18 DIAGNOSIS — J690 Pneumonitis due to inhalation of food and vomit: Secondary | ICD-10-CM | POA: Diagnosis present

## 2022-11-18 DIAGNOSIS — Z0001 Encounter for general adult medical examination with abnormal findings: Secondary | ICD-10-CM | POA: Diagnosis not present

## 2022-11-18 DIAGNOSIS — L89896 Pressure-induced deep tissue damage of other site: Secondary | ICD-10-CM | POA: Diagnosis not present

## 2022-11-18 DIAGNOSIS — M25431 Effusion, right wrist: Secondary | ICD-10-CM | POA: Diagnosis not present

## 2022-11-18 DIAGNOSIS — S90822A Blister (nonthermal), left foot, initial encounter: Secondary | ICD-10-CM | POA: Diagnosis not present

## 2022-11-18 DIAGNOSIS — Z801 Family history of malignant neoplasm of trachea, bronchus and lung: Secondary | ICD-10-CM

## 2022-11-18 DIAGNOSIS — R799 Abnormal finding of blood chemistry, unspecified: Secondary | ICD-10-CM | POA: Diagnosis present

## 2022-11-18 DIAGNOSIS — J45909 Unspecified asthma, uncomplicated: Secondary | ICD-10-CM | POA: Diagnosis present

## 2022-11-18 DIAGNOSIS — I959 Hypotension, unspecified: Secondary | ICD-10-CM | POA: Diagnosis present

## 2022-11-18 DIAGNOSIS — R739 Hyperglycemia, unspecified: Secondary | ICD-10-CM | POA: Diagnosis present

## 2022-11-18 DIAGNOSIS — N393 Stress incontinence (female) (male): Secondary | ICD-10-CM | POA: Diagnosis present

## 2022-11-18 DIAGNOSIS — M21371 Foot drop, right foot: Secondary | ICD-10-CM | POA: Diagnosis present

## 2022-11-18 DIAGNOSIS — Z7409 Other reduced mobility: Secondary | ICD-10-CM | POA: Diagnosis present

## 2022-11-18 DIAGNOSIS — R7401 Elevation of levels of liver transaminase levels: Secondary | ICD-10-CM | POA: Diagnosis present

## 2022-11-18 DIAGNOSIS — F418 Other specified anxiety disorders: Secondary | ICD-10-CM | POA: Diagnosis not present

## 2022-11-18 DIAGNOSIS — N390 Urinary tract infection, site not specified: Secondary | ICD-10-CM | POA: Diagnosis not present

## 2022-11-18 DIAGNOSIS — L89623 Pressure ulcer of left heel, stage 3: Secondary | ICD-10-CM | POA: Diagnosis present

## 2022-11-18 DIAGNOSIS — R6520 Severe sepsis without septic shock: Secondary | ICD-10-CM

## 2022-11-18 DIAGNOSIS — B9620 Unspecified Escherichia coli [E. coli] as the cause of diseases classified elsewhere: Secondary | ICD-10-CM | POA: Diagnosis not present

## 2022-11-18 DIAGNOSIS — Z681 Body mass index (BMI) 19 or less, adult: Secondary | ICD-10-CM | POA: Diagnosis not present

## 2022-11-18 DIAGNOSIS — N170 Acute kidney failure with tubular necrosis: Secondary | ICD-10-CM | POA: Diagnosis present

## 2022-11-18 DIAGNOSIS — E861 Hypovolemia: Secondary | ICD-10-CM | POA: Diagnosis not present

## 2022-11-18 DIAGNOSIS — Z66 Do not resuscitate: Secondary | ICD-10-CM | POA: Diagnosis present

## 2022-11-18 DIAGNOSIS — G8250 Quadriplegia, unspecified: Secondary | ICD-10-CM | POA: Diagnosis present

## 2022-11-18 DIAGNOSIS — I10 Essential (primary) hypertension: Secondary | ICD-10-CM | POA: Insufficient documentation

## 2022-11-18 DIAGNOSIS — Z975 Presence of (intrauterine) contraceptive device: Secondary | ICD-10-CM

## 2022-11-18 DIAGNOSIS — Z83719 Family history of colon polyps, unspecified: Secondary | ICD-10-CM

## 2022-11-18 DIAGNOSIS — J15212 Pneumonia due to Methicillin resistant Staphylococcus aureus: Secondary | ICD-10-CM | POA: Diagnosis present

## 2022-11-18 DIAGNOSIS — R000 Tachycardia, unspecified: Secondary | ICD-10-CM | POA: Diagnosis not present

## 2022-11-18 DIAGNOSIS — Z8249 Family history of ischemic heart disease and other diseases of the circulatory system: Secondary | ICD-10-CM

## 2022-11-18 DIAGNOSIS — R5383 Other fatigue: Secondary | ICD-10-CM | POA: Diagnosis not present

## 2022-11-18 DIAGNOSIS — G931 Anoxic brain damage, not elsewhere classified: Principal | ICD-10-CM

## 2022-11-18 DIAGNOSIS — L89892 Pressure ulcer of other site, stage 2: Secondary | ICD-10-CM | POA: Diagnosis not present

## 2022-11-18 DIAGNOSIS — L89320 Pressure ulcer of left buttock, unstageable: Secondary | ICD-10-CM | POA: Diagnosis present

## 2022-11-18 DIAGNOSIS — A419 Sepsis, unspecified organism: Principal | ICD-10-CM

## 2022-11-18 DIAGNOSIS — Z7189 Other specified counseling: Secondary | ICD-10-CM | POA: Diagnosis not present

## 2022-11-18 DIAGNOSIS — R1310 Dysphagia, unspecified: Secondary | ICD-10-CM | POA: Diagnosis not present

## 2022-11-18 DIAGNOSIS — N319 Neuromuscular dysfunction of bladder, unspecified: Secondary | ICD-10-CM | POA: Diagnosis present

## 2022-11-18 DIAGNOSIS — M25511 Pain in right shoulder: Secondary | ICD-10-CM | POA: Diagnosis present

## 2022-11-18 DIAGNOSIS — E43 Unspecified severe protein-calorie malnutrition: Secondary | ICD-10-CM | POA: Diagnosis present

## 2022-11-18 DIAGNOSIS — K5900 Constipation, unspecified: Secondary | ICD-10-CM | POA: Diagnosis not present

## 2022-11-18 DIAGNOSIS — G4700 Insomnia, unspecified: Secondary | ICD-10-CM | POA: Diagnosis present

## 2022-11-18 DIAGNOSIS — Z820 Family history of epilepsy and other diseases of the nervous system: Secondary | ICD-10-CM

## 2022-11-18 DIAGNOSIS — Z515 Encounter for palliative care: Secondary | ICD-10-CM | POA: Diagnosis not present

## 2022-11-18 DIAGNOSIS — F0789 Other personality and behavioral disorders due to known physiological condition: Secondary | ICD-10-CM | POA: Diagnosis not present

## 2022-11-18 DIAGNOSIS — N179 Acute kidney failure, unspecified: Secondary | ICD-10-CM | POA: Diagnosis not present

## 2022-11-18 DIAGNOSIS — R252 Cramp and spasm: Secondary | ICD-10-CM | POA: Diagnosis not present

## 2022-11-18 DIAGNOSIS — L89153 Pressure ulcer of sacral region, stage 3: Secondary | ICD-10-CM | POA: Diagnosis present

## 2022-11-18 DIAGNOSIS — M6282 Rhabdomyolysis: Secondary | ICD-10-CM | POA: Diagnosis present

## 2022-11-18 DIAGNOSIS — R21 Rash and other nonspecific skin eruption: Secondary | ICD-10-CM | POA: Diagnosis not present

## 2022-11-18 DIAGNOSIS — F32A Depression, unspecified: Secondary | ICD-10-CM | POA: Diagnosis present

## 2022-11-18 DIAGNOSIS — R569 Unspecified convulsions: Secondary | ICD-10-CM | POA: Diagnosis not present

## 2022-11-18 DIAGNOSIS — G903 Multi-system degeneration of the autonomic nervous system: Secondary | ICD-10-CM | POA: Diagnosis not present

## 2022-11-18 DIAGNOSIS — J9601 Acute respiratory failure with hypoxia: Secondary | ICD-10-CM | POA: Diagnosis not present

## 2022-11-18 DIAGNOSIS — Z825 Family history of asthma and other chronic lower respiratory diseases: Secondary | ICD-10-CM

## 2022-11-18 DIAGNOSIS — R159 Full incontinence of feces: Secondary | ICD-10-CM | POA: Diagnosis not present

## 2022-11-18 DIAGNOSIS — Z993 Dependence on wheelchair: Secondary | ICD-10-CM

## 2022-11-18 DIAGNOSIS — F1410 Cocaine abuse, uncomplicated: Secondary | ICD-10-CM | POA: Diagnosis present

## 2022-11-18 DIAGNOSIS — G253 Myoclonus: Secondary | ICD-10-CM | POA: Diagnosis not present

## 2022-11-18 DIAGNOSIS — L8990 Pressure ulcer of unspecified site, unspecified stage: Secondary | ICD-10-CM | POA: Insufficient documentation

## 2022-11-18 DIAGNOSIS — L409 Psoriasis, unspecified: Secondary | ICD-10-CM | POA: Diagnosis not present

## 2022-11-18 DIAGNOSIS — Z91018 Allergy to other foods: Secondary | ICD-10-CM

## 2022-11-18 DIAGNOSIS — M25531 Pain in right wrist: Secondary | ICD-10-CM | POA: Diagnosis present

## 2022-11-18 DIAGNOSIS — Z8673 Personal history of transient ischemic attack (TIA), and cerebral infarction without residual deficits: Secondary | ICD-10-CM

## 2022-11-18 DIAGNOSIS — Z806 Family history of leukemia: Secondary | ICD-10-CM

## 2022-11-18 DIAGNOSIS — Z881 Allergy status to other antibiotic agents status: Secondary | ICD-10-CM

## 2022-11-18 DIAGNOSIS — Z811 Family history of alcohol abuse and dependence: Secondary | ICD-10-CM

## 2022-11-18 DIAGNOSIS — Z8262 Family history of osteoporosis: Secondary | ICD-10-CM

## 2022-11-18 DIAGNOSIS — F1990 Other psychoactive substance use, unspecified, uncomplicated: Secondary | ICD-10-CM | POA: Diagnosis not present

## 2022-11-18 DIAGNOSIS — I950 Idiopathic hypotension: Secondary | ICD-10-CM | POA: Diagnosis not present

## 2022-11-18 DIAGNOSIS — Z823 Family history of stroke: Secondary | ICD-10-CM

## 2022-11-18 DIAGNOSIS — E876 Hypokalemia: Secondary | ICD-10-CM | POA: Diagnosis not present

## 2022-11-18 DIAGNOSIS — M25561 Pain in right knee: Secondary | ICD-10-CM | POA: Diagnosis not present

## 2022-11-18 DIAGNOSIS — R1312 Dysphagia, oropharyngeal phase: Secondary | ICD-10-CM | POA: Diagnosis not present

## 2022-11-18 DIAGNOSIS — R300 Dysuria: Secondary | ICD-10-CM | POA: Diagnosis not present

## 2022-11-18 DIAGNOSIS — R339 Retention of urine, unspecified: Secondary | ICD-10-CM | POA: Diagnosis not present

## 2022-11-18 DIAGNOSIS — M21372 Foot drop, left foot: Secondary | ICD-10-CM | POA: Diagnosis present

## 2022-11-18 DIAGNOSIS — F1411 Cocaine abuse, in remission: Secondary | ICD-10-CM

## 2022-11-18 DIAGNOSIS — R5381 Other malaise: Secondary | ICD-10-CM

## 2022-11-18 DIAGNOSIS — Z1152 Encounter for screening for COVID-19: Secondary | ICD-10-CM | POA: Diagnosis not present

## 2022-11-18 DIAGNOSIS — M545 Low back pain, unspecified: Secondary | ICD-10-CM | POA: Diagnosis not present

## 2022-11-18 DIAGNOSIS — Z79899 Other long term (current) drug therapy: Secondary | ICD-10-CM

## 2022-11-18 DIAGNOSIS — K5901 Slow transit constipation: Secondary | ICD-10-CM | POA: Diagnosis not present

## 2022-11-18 DIAGNOSIS — J189 Pneumonia, unspecified organism: Secondary | ICD-10-CM | POA: Diagnosis not present

## 2022-11-18 DIAGNOSIS — Z22322 Carrier or suspected carrier of Methicillin resistant Staphylococcus aureus: Secondary | ICD-10-CM

## 2022-11-18 DIAGNOSIS — F419 Anxiety disorder, unspecified: Secondary | ICD-10-CM | POA: Diagnosis present

## 2022-11-18 DIAGNOSIS — Z8489 Family history of other specified conditions: Secondary | ICD-10-CM

## 2022-11-18 MED ORDER — LINEZOLID 600 MG PO TABS: 600.0000 mg | ORAL_TABLET | Freq: Two times a day (BID) | ORAL | Status: DC

## 2022-11-18 MED ORDER — POLYETHYLENE GLYCOL 3350 17 G PO PACK: 17.0000 g | PACK | Freq: Every day | ORAL | Status: DC | PRN

## 2022-11-18 MED ORDER — ENOXAPARIN SODIUM 40 MG/0.4ML IJ SOSY
40.0000 mg | PREFILLED_SYRINGE | INTRAMUSCULAR | Status: DC
  Administered 2022-11-19 – 2023-03-14 (×116): 40 mg via SUBCUTANEOUS
  Filled 2022-11-18 (×115): qty 0.4

## 2022-11-18 MED ORDER — ENOXAPARIN SODIUM 40 MG/0.4ML IJ SOSY: 40.0000 mg | PREFILLED_SYRINGE | INTRAMUSCULAR | Status: DC

## 2022-11-18 MED ORDER — ACETAMINOPHEN 325 MG PO TABS: 650.0000 mg | ORAL_TABLET | Freq: Four times a day (QID) | ORAL | Status: AC | PRN

## 2022-11-18 MED ORDER — ORAL CARE MOUTH RINSE: 15.0000 mL | OROMUCOSAL | 0 refills | Status: DC | PRN

## 2022-11-18 MED ORDER — LINEZOLID 600 MG PO TABS
600.0000 mg | ORAL_TABLET | Freq: Two times a day (BID) | ORAL | Status: AC
  Administered 2022-11-18 – 2022-11-28 (×20): 600 mg via ORAL
  Filled 2022-11-18 (×20): qty 1

## 2022-11-18 MED ORDER — ENOXAPARIN SODIUM 40 MG/0.4ML IJ SOSY
40.00 mg | PREFILLED_SYRINGE | INTRAMUSCULAR | Status: DC
Start: 2022-11-18 — End: 2022-11-18

## 2022-11-18 MED ORDER — ADULT MULTIVITAMIN W/MINERALS CH
1.0000 | ORAL_TABLET | Freq: Every day | ORAL | Status: DC
  Administered 2022-11-19 – 2023-04-22 (×155): 1 via ORAL
  Filled 2022-11-18 (×157): qty 1

## 2022-11-18 MED ORDER — ADULT MULTIVITAMIN W/MINERALS CH: 1.0000 | ORAL_TABLET | Freq: Every day | ORAL | Status: DC

## 2022-11-18 MED ORDER — ENSURE ENLIVE PO LIQD: 237.0000 mL | Freq: Two times a day (BID) | ORAL | 12 refills | Status: DC

## 2022-11-18 MED ORDER — MEDIHONEY WOUND/BURN DRESSING EX PSTE: 1.0000 | PASTE | Freq: Every day | CUTANEOUS | Status: DC

## 2022-11-18 MED ORDER — ONDANSETRON HCL 4 MG/2ML IJ SOLN: 4.0000 mg | Freq: Four times a day (QID) | INTRAMUSCULAR | 0 refills | Status: DC | PRN

## 2022-11-18 MED ORDER — ENSURE ENLIVE PO LIQD
237.0000 mL | Freq: Two times a day (BID) | ORAL | Status: DC
  Administered 2022-11-19 – 2022-12-06 (×33): 237 mL via ORAL

## 2022-11-18 MED ORDER — MIDODRINE HCL 5 MG PO TABS: 5.0000 mg | ORAL_TABLET | Freq: Three times a day (TID) | ORAL | Status: DC

## 2022-11-18 MED ORDER — PENTAFLUOROPROP-TETRAFLUOROETH EX AERO: 1.0000 | INHALATION_SPRAY | Freq: Four times a day (QID) | CUTANEOUS | 0 refills | Status: DC | PRN

## 2022-11-18 MED ORDER — ACETAMINOPHEN 325 MG PO TABS
650.0000 mg | ORAL_TABLET | Freq: Four times a day (QID) | ORAL | Status: DC | PRN
  Administered 2022-11-21 – 2023-07-18 (×122): 650 mg via ORAL
  Filled 2022-11-18 (×126): qty 2

## 2022-11-18 MED ORDER — POLYETHYLENE GLYCOL 3350 17 G PO PACK: 17.0000 g | PACK | Freq: Every day | ORAL | 0 refills | Status: DC | PRN

## 2022-11-18 MED ORDER — MIDODRINE HCL 5 MG PO TABS
5.0000 mg | ORAL_TABLET | Freq: Three times a day (TID) | ORAL | Status: DC
  Administered 2022-11-18 – 2022-11-21 (×8): 5 mg via ORAL
  Filled 2022-11-18 (×8): qty 1

## 2022-11-18 MED ORDER — MEDIHONEY WOUND/BURN DRESSING EX PSTE
1.0000 | PASTE | Freq: Every day | CUTANEOUS | Status: DC
  Administered 2022-11-19 – 2023-02-15 (×87): 1 via TOPICAL
  Filled 2022-11-18 (×5): qty 44

## 2022-11-18 NOTE — Progress Notes (Signed)
Inpatient Rehabilitation Admission Medication Review by a Pharmacist  A complete drug regimen review was completed for this patient to identify any potential clinically significant medication issues.  High Risk Drug Classes Is patient taking? Indication by Medication  Antipsychotic No   Anticoagulant Yes Lovenox: VTE prophylaxis  Antibiotic Yes Linezolid: MRSA pneumonia  Opioid No   Antiplatelet No   Hypoglycemics/insulin No   Vasoactive Medication Yes Midodrine: hypotension  Chemotherapy No   Other Yes Tylenol: pain MVI: vitamin/supplement Miralax: constipation     Type of Medication Issue Identified Description of Issue Recommendation(s)  Drug Interaction(s) (clinically significant)     Duplicate Therapy     Allergy     No Medication Administration End Date     Incorrect Dose     Additional Drug Therapy Needed     Significant med changes from prior encounter (inform family/care partners about these prior to discharge). No recent dispense history for meds Communicate medication changes with patient/family at discharge  Other       Clinically significant medication issues were identified that warrant physician communication and completion of prescribed/recommended actions by midnight of the next day:  No   Time spent performing this drug regimen review (minutes): 30   Thank you for allowing pharmacy to be a part of this patient's care.   Signe Colt, PharmD 10/20/2022 3:14 PM  **Pharmacist phone directory can be found on [email or web address] listed under [facility]**

## 2022-11-18 NOTE — H&P (Signed)
Physical Medicine and Rehabilitation Admission H&P        Chief Complaint  Patient presents with   unresponsive  : HPI: Nicole [name] is a 47 year old right-handed female with history of asthma as well as anxiety/depression, drug abuse.  Per chart review patient lives with her mother.  1 level home with level entry.  Family is planning assist as needed.  Patient reportedly independent prior to admission.  Presented 10/31/2022 after being found down in a field beside a condemned building.  Patient unresponsive on arrival to the ED, hypoxic on 15 L nonrebreather mask.  Required intubation for airway protection.  Cranial CT scan showed abnormal symmetric edema with some bilateral globus paladi concerning for acute hypoxic/ischemic or metabolic/toxic injury.  No mass effect.  CT cervical spine negative.  MRI of the brain symmetric signal abnormality within the globi pallidi most consistent with toxic metabolic injury.  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  Small amount of bifrontal subarachnoid hemorrhage.  EEG negative for seizure.  CT of the chest abdomen pelvis showed right greater than left lower lobe consolidation with numerous associated centrilobular nodules and groundglass opacities.  Trace right pleural effusion.  No pneumothorax.  Admission chemistries unremarkable except sodium 161 chloride 124 CO2 21 BUN 79 creatinine 2.57 AST 102 ALT 457 total bilirubin 2.2, WBC 43,600, lactic acid 4.4, CK3 109, BNP 140, urine drug screen positive cocaine, blood cultures no growth to date.  Neurosurgery Dr. Jake Samples follow-up for diffuse cerebral edema in the setting of suspected acute hypoxic brain injury no neurosurgical intervention recommended.  Infectious disease Dr. Elinor Parkinson consulted in regards to cavitary pneumonia and initially placed on broad-spectrum antibiotics since transitioned to Zyvox..  Leukocytosis much improved latest WBC 10,900 Lovenox added for DVT prophylaxis.   Echocardiogram showed no valvular vegetation and ejection fraction of 60 to 65%.  TEE not recommended at this time..  Patient was extubated 6/23.  Tolerating mechanical soft diet.  AKI with multiple electrolyte abnormalities most likely combination of hypovolemia and ATN placed on gentle IV fluids with latest creatinine normalized 0.64.  Therapy evaluations completed due to patient decreased functional mobility and altered mental status was admitted for a comprehensive rehab program.   Review of Systems  All other systems reviewed and are negative.       Past Medical History:  Diagnosis Date   Anxiety     Asthma     Depression           Past Surgical History:  Procedure Laterality Date   TONSILLECTOMY             Family History  Problem Relation Age of Onset   Leukemia Father      Social History:  reports that she has never smoked. She has never used smokeless tobacco. She reports current alcohol use. She reports current drug use. Drug: "Crack" cocaine. Allergies:      Allergies  Allergen Reactions   [place] Nut (Berthollefia Excelsa) Swelling   Cashew Nut (Anacardium Occidentale) Skin Test Swelling   Ceclor [Cefaclor] Itching          Medications Prior to Admission  Medication Sig Dispense Refill   albuterol (VENTOLIN HFA) 108 (90 Base) MCG/ACT inhaler Inhale 1 puff into the lungs every 6 (six) hours as needed for wheezing or shortness of breath.       aripiprazole (ABILIFY) 15 MG disintegrating tablet Take 15 mg by mouth daily.       calcium carbonate (OSCAL) 1500 (600 Ca)  MG TABS tablet Take 600 mg of elemental calcium by mouth 2 (two) times daily with a meal.       diphenoxylate-atropine (LOMOTIL) 2.5-0.025 MG tablet Take 1 tablet by mouth 4 (four) times daily as needed for diarrhea or loose stools.       doxepin (SINEQUAN) 10 MG capsule Take 10 mg by mouth at bedtime as needed (PRN).       loratadine (CLARITIN REDITABS) 10 MG dissolvable tablet Take 10 mg by mouth daily.        Plecanatide 3 MG TABS Take 3 mg by mouth as directed.       ramelteon (ROZEREM) 8 MG tablet Take 8 mg by mouth at bedtime.       topiramate (TOPAMAX) 25 MG capsule Take 25 mg by mouth in the morning, at noon, and at bedtime.              Home: Home Living Family/patient expects to be discharged to:: Private residence Living Arrangements: Parent Available Help at Discharge: Family, Other (Comment) (pt's mother works x2 days a week) Type of Home: House Home Access: Level entry Home Layout: One level Bathroom Shower/Tub: Health visitor: Standard Home Equipment: Medical laboratory scientific officer - single point Additional Comments: Currently lives with mom who works a couple days per week; home setup info gathered from [employer] mom  Lives With: Alone   Functional History: Prior Function Prior Level of Function : Independent/Modified Independent, Patient poor historian/Family not available Mobility Comments: per pt and mother, pt is independent with mobility PTA. [name] states pt would be gone for up to a week at a time ADLs Comments: pt reports independence   Functional Status:  Mobility: Bed Mobility Overal bed mobility: Needs Assistance Bed Mobility: Rolling, Sidelying to Sit Rolling: Total assist, +2 for physical assistance, +2 for safety/equipment Sidelying to sit: Total assist, +2 for physical assistance, +2 for safety/equipment Supine to sit: Total assist, HOB elevated Sit to supine: Total assist General bed mobility comments: assistance at trunk and BLEs Transfers Overall transfer level: Needs assistance Equipment used: 1 person hand held assist Transfers: Bed to chair/wheelchair/BSC, Sit to/from Stand Sit to Stand: Total assist Bed to/from chair/wheelchair/BSC transfer type:: Stand pivot Stand pivot transfers: Total assist Step pivot transfers: Total assist General transfer comment: stand pivot transfer from EOB>chair with total A, STS transfer from chair with total  A Ambulation/Gait General Gait Details: pt unable to stand   ADL: ADL Overall ADL's : Needs assistance/impaired Eating/Feeding: Minimal assistance, Sitting Eating/Feeding Details (indicate cue type and reason): assist for package management, cutting food, placing utensil in hand; required max verbal cues to initiate self-feeding this session. Grooming: Wash/dry hands, Minimal assistance, Sitting Grooming Details (indicate cue type and reason): washed bilat hands with washcloth with min A for thoroughness and max verbal cues for initiation Upper Body Bathing: Moderate assistance, Sitting Lower Body Bathing: Maximal assistance, +2 for physical assistance, +2 for safety/equipment, Sit to/from stand Upper Body Dressing : Total assistance, Sitting Upper Body Dressing Details (indicate cue type and reason): doffed/donned hospital gown with total A, max cues for initiation and HOH Lower Body Dressing: Total assistance, Bed level Lower Body Dressing Details (indicate cue type and reason): total A to don bilat socks at bed level Toilet Transfer: Total assistance, Tax adviser Details (indicate cue type and reason): simulated Toileting- Clothing Manipulation and Hygiene: Total assistance, Bed level Toileting - Clothing Manipulation Details (indicate cue type and reason): total A for pericare at bed level Functional mobility  during ADLs: Total assistance General ADL Comments: lmited secondary to cognition, generalized weakness, decreased balance   Cognition: Cognition Overall Cognitive Status: Impaired/Different from baseline Arousal/Alertness: Awake/alert Orientation Level: Oriented to person Year: 2024 Month: June Day of Week: Incorrect Attention: Focused, Sustained Focused Attention: Appears intact Sustained Attention: Impaired Sustained Attention Impairment: Verbal complex Memory: Impaired Memory Impairment:  (Immediate: 5/5; delayed: 0/5; with cues: 0/5) Awareness:  Impaired Awareness Impairment: Intellectual impairment Cognition Arousal/Alertness: Awake/alert Behavior During Therapy: Flat affect Overall Cognitive Status: Impaired/Different from baseline Area of Impairment: Orientation, Attention, Following commands, Safety/judgement, Problem solving, Awareness Orientation Level: Time, Situation Current Attention Level: Focused Memory: Decreased short-term memory Following Commands: Follows one step commands inconsistently, Follows one step commands with increased time Safety/Judgement: Decreased awareness of safety, Decreased awareness of deficits Awareness: Intellectual Problem Solving: Slow processing, Decreased initiation, Difficulty sequencing, Requires verbal cues, Requires tactile cues General Comments: Benefits from simple 1 step functional tasks with max verbal/tactile cues, limited carryover with nonfunctional commands.   Physical Exam: Blood pressure (!) 88/59, pulse 94, temperature (!) 97.1 F (36.2 C), temperature source Axillary, resp. rate 18, height 5\' 1"  (1.549 m), weight 46.5 kg, SpO2 97 %. Physical Exam Neurological:     Comments: Patient is lethargic but arousable.  Makes eye contact with examiner.  She did provide her name with some delay in processing as well as age but could not provide the appropriate month.  She had very poor recall of events.  Follows simple commands.      General: No acute distress Mood and affect are appropriate Heart: Regular rate and rhythm no rubs murmurs or extra sounds Lungs: Clear to auscultation, breathing unlabored, no rales or wheezes Abdomen: Positive bowel sounds, soft nontender to palpation, nondistended Extremities: No clubbing, cyanosis, or edema Skin: No evidence of breakdown, no evidence of rash Neurologic:  motor strength could not assess due to cognition , eyes open , did not follow commands Tone ineased flexor tone in upper ext elbow flexors hands are relaxed, LE Tone in KE is mildly  increased Sensory exam grimace to pinch in bilateral fingers and toes  Cerebellar exam could not assess Musculoskeletal: Active ROM not tested due to cooperation, no joint swelling or pain with ROM in shoulders, elbows wrists and fingers as well as hips knees and ankles, increase elbow flexor tone limits full extension   Lab Results Last 48 Hours  No results found for this or any previous visit (from the past 48 hour(s)).     Imaging Results (Last 48 hours)  No results found.         Blood pressure (!) 88/59, pulse 94, temperature (!) 97.1 F (36.2 C), temperature source Axillary, resp. rate 18, height 5\' 1"  (1.549 m), weight 46.5 kg, SpO2 97 %.   Medical Problem List and Plan: 1. 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  Extubated 11/05/2022 Fluctuating level of alertness             -patient may  shower             -ELOS/Goals: min/modA goals 28-30d 2.  Antithrombotics: -DVT/anticoagulation:  Pharmaceutical: Lovenox             -antiplatelet therapy: N/A 3. Pain Management: Tylenol as needed 4. Mood/Behavior/Sleep: Provide emotional support             -antipsychotic agents: N/A 5. Neuropsych/cognition: This patient is not capable of making decisions on her own behalf. 6. Skin/Wound Care: Routine skin  checks/left buttock unstageable ulcer.  Apply Medihoney to buttock wound daily cover with foam dressing.  Change dressing every 3 days or as needed soiling 7. Fluids/Electrolytes/Nutrition: Routine in and outs with follow-up chemistries 8.  Cavitary right lower lobe pneumonia likely aspiration pneumonia/MRSA pneumonia.  Continue course of Zyvox as indicated per infectious disease 9.  History of drug abuse.  Positive cocaine on urine drug screen.  Provide counseling 10.  AKI/hypovolemia and ATN.  Renal function much improved.  Follow-up chemistries 11.  Mild transaminitis with rhabdomyolysis.  CK peaked at 900 12.   Hypotension.  ProAmatine 5 mg 3 times daily     Charlton Amor, PA-C 11/17/2022 "I have personally performed a face to face diagnostic evaluation of this patient.  Additionally, I have reviewed and concur with the physician assistant's documentation above."  Erick Colace M.D. [facility] Fellow Am Acad of Phys Med and Rehab Diplomate Am Board of Electrodiagnostic Med Fellow Am Board of Interventional Pain

## 2022-11-18 NOTE — Discharge Summary (Signed)
Physician Discharge Summary  Nicole [name] ZOX:[id number] DOB: Jan 04, 1976 DOA: 10/31/2022  PCP: Georganna Skeans, MD  Admit date: 10/31/2022 Discharge date: 11/18/2022 Admitted From: Home Disposition: CIR Recommendations for Outpatient Follow-up:  Check CMP and CBC in 1 week Repeat CT chest in about 4 weeks from discharge Please follow up on the following pending results: None   Discharge Condition: Stable but guarded prognosis CODE STATUS: Full code   Hospital course 47 year old F with PMH of polysubstance use brought to ED by EMS after found unresponsive by bystander next-door and admitted for severe sepsis, hypoxic respiratory failure,  bilateral pneumonia, hyponatremia (164), AKI and stroke.  UDS positive for cocaine.  CXR showed right basilar airspace disease.  CT chest, abdomen and pelvis showed bilateral lower lobe consolidation, nodular GGO opacities with lucency in RLL concerning for cavitary infection. CT head and MRI brain showed symmetric edema within bilateral globus pallidus concerning for ischemic/hypoxic injury.  Patient was initially intubated and admitted to ICU.  Eventually improved and extubated on 6/23.  Transferred out of ICU on 6/25.    In regards to her pneumonia, respiratory culture was positive for MRSA.  ID recommended Zyvox through 7/15.  PCCM recommended repeat imaging in about 6 weeks.  She is on dysphagia 3 diet.  In regards to stroke, TTE without significant finding.  Neurology recommended avoiding hypotension.  She is discharged on midodrine.   Therapy recommended CIR.  See individual problem list below for more.   Problems addressed during this hospitalization Principal Problem:   Acute respiratory failure (HCC) Active Problems:   Cavitary pneumonia   Substance use   Severe sepsis due to bilateral pneumonia: POA. Acute respiratory failure with hypoxia due to bilateral pneumonia Cavitary RLL pneumonia likely due to aspiration pneumonia MRSA  pneumonia: Respiratory culture positive for MRSA. -CXR and CT chest as above. -ETT 6/18-6/23.  Now on room air. -ID recommends oral Zyvox to complete 4 weeks of antibiotics through July 15 -PCCM recommends repeat imaging in 6 to 8 weeks   Acute CVA: CT head and MRI brain showed bilateral globus pallidus injury with bilateral coronary radiata watershed infarcts.  TTE without significant finding. -Neurology recommended avoiding hypotension.  Continue midodrine 5 mg 3 times daily   Small bifrontal subarachnoid hemorrhage: Noted on MRI on 6/18. -Supportive care   Acute metabolic encephalopathy: Multifactorial including pneumonia, hypoxemia, CVA and cocaine use.  Patient is awake but not quite alert.  Follows some commands.  EEG negative for seizure or epileptiform discharge. -Reorientation and delirium precaution -Resume home doxepin and topiramate -Continue dysphagia 3 diet.   Substance use, cocaine abuse: UDS positive. -Encourage cessation when able to comprehend.   AKI: Cr 12.57 on admission.  Multifactorial including ATN due to severe sepsis, hypotension, polysubstance use.  Resolved. Recent Labs    11/06/22 0540 11/07/22 0110 11/08/22 0315 11/09/22 0423 11/10/22 0359 11/11/22 0249 11/12/22 1015 11/13/22 1226 11/14/22 0409 11/15/22 0423  BUN 12 14 19  24* 24* 26* 17 12 15 17   CREATININE 0.71 0.78 0.87 0.75 0.87 0.71 0.55 0.44 0.59 0.64  -Check renal function in 1 week  Hypernatremia: Resolved. On admission serum sodium 165.  Improved with IV hydration.   Hypotension: Improved. -Continue midodrine to avoid further watershed stroke   Hypokalemia hypocalcemia: Resolved   Mild transaminitis with rhabdomyolysis: Resolved   Inadequate oral intake Body mass index is 19.37 kg/m. Nutrition Problem: Inadequate oral intake Etiology: inability to eat Signs/Symptoms: NPO status Interventions: Refer to RD note for recommendations  Left buttock unstageable ulcer Pressure  Injury 10/31/22 Buttocks Left Unstageable - Full thickness tissue loss in which the base of the injury is covered by slough (yellow, tan, gray, green or brown) and/or eschar (tan, brown or black) in the wound bed. (Active)  10/31/22 2000  Location: Buttocks  Location Orientation: Left  Staging: Unstageable - Full thickness tissue loss in which the base of the injury is covered by slough (yellow, tan, gray, green or brown) and/or eschar (tan, brown or black) in the wound bed.  Wound Description (Comments):   Present on Admission: Yes  Dressing Type Foam - Lift dressing to assess site every shift 11/17/22 0749    Time spent 35 minutes  Vital signs Vitals:   11/17/22 1553 11/17/22 1932 11/18/22 0450 11/18/22 0500  BP: 98/70 106/76 111/85   Pulse: 90 98 96   Temp: 98 F (36.7 C) 98.3 F (36.8 C) 98 F (36.7 C)   Resp: 16 16 16    Height:      Weight:    46.3 kg  SpO2: 98% 99% 97%   TempSrc: Oral Oral Oral   BMI (Calculated):    19.3     Discharge exam  GENERAL: No apparent distress.  Nontoxic. HEENT: MMM.  Vision and hearing grossly intact.  NECK: Supple.  No apparent JVD.  RESP:  No IWOB.  Fair aeration bilaterally. CVS:  RRR. Heart sounds normal.  ABD/GI/GU: BS+. Abd soft, NTND.  MSK/EXT: Moves upper extremities.  Not moving lower extremities.  And Prevalon boot NEURO: Awake and oriented to self.  Follows some commands.  Able to move her arms but not legs.  PSYCH: Calm.  Flat affect?  Discharge Instructions Discharge Instructions     Diet general   Complete by: As directed    Dysphagia-3 diet   Discharge wound care:   Complete by: As directed    Wound care  Daily      Comments: Apply Medihoney to buttock wounds Q day, then cover with foam dressing.  Change foam dressing Q 3 days or PRN soiling.   Increase activity slowly   Complete by: As directed       Allergies as of 11/18/2022       Reactions   [place]ia Nut (berthollefia Excelsa) Swelling   Cashew Nut  (anacardium Occidentale) Skin Test Swelling   Ceclor [cefaclor] Itching        Medication List     STOP taking these medications    albuterol 108 (90 Base) MCG/ACT inhaler Commonly known as: VENTOLIN HFA   aripiprazole 15 MG disintegrating tablet Commonly known as: ABILIFY   calcium carbonate 1500 (600 Ca) MG Tabs tablet Commonly known as: OSCAL   diphenoxylate-atropine 2.5-0.025 MG tablet Commonly known as: LOMOTIL   loratadine 10 MG dissolvable tablet Commonly known as: CLARITIN REDITABS   Plecanatide 3 MG Tabs   ramelteon 8 MG tablet Commonly known as: ROZEREM       TAKE these medications    acetaminophen 325 MG tablet Commonly known as: TYLENOL Take 2 tablets (650 mg total) by mouth every 6 (six) hours as needed for mild pain, moderate pain or fever.   doxepin 10 MG capsule Commonly known as: SINEQUAN Take 10 mg by mouth at bedtime as needed (PRN).   enoxaparin 40 MG/0.4ML injection Commonly known as: LOVENOX Inject 0.4 mLs (40 mg total) into the skin daily.   feeding supplement Liqd Take 237 mLs by mouth 2 (two) times daily between meals.   leptospermum manuka  honey Pste paste Apply 1 Application topically daily.   linezolid 600 MG tablet Commonly known as: ZYVOX Take 1 tablet (600 mg total) by mouth every 12 (twelve) hours for 9 days.   midodrine 5 MG tablet Commonly known as: PROAMATINE Take 1 tablet (5 mg total) by mouth 3 (three) times daily with meals.   mouth rinse Liqd solution 15 mLs by Mouth Rinse route as needed (for oral care).   multivitamin with minerals Tabs tablet Take 1 tablet by mouth daily.   ondansetron 4 MG/2ML Soln injection Commonly known as: ZOFRAN Inject 2 mLs (4 mg total) into the vein every 6 (six) hours as needed for nausea or vomiting.   pentafluoroprop-tetrafluoroeth Aero Commonly known as: GEBAUERS Apply 1 Application topically 4 (four) times daily as needed.   polyethylene glycol 17 g packet Commonly  known as: MIRALAX / GLYCOLAX Take 17 g by mouth daily as needed for moderate constipation.   topiramate 25 MG capsule Commonly known as: TOPAMAX Take 25 mg by mouth in the morning, at noon, and at bedtime.               Discharge Care Instructions  (From admission, onward)           Start     Ordered   11/18/22 0000  Discharge wound care:       Comments: Wound care  Daily      Comments: Apply Medihoney to buttock wounds Q day, then cover with foam dressing.  Change foam dressing Q 3 days or PRN soiling.   11/18/22 0724            Consultations: Pulmonology Neurology Infectious disease  Procedures/Studies: Intubation and mechanical ventilation   CT HEAD WO CONTRAST ( )  Result Date: 11/15/2022 CLINICAL DATA:  Mental status change with unknown cause EXAM: CT HEAD WITHOUT CONTRAST TECHNIQUE: Contiguous axial images were obtained from the base of the skull through the vertex without intravenous contrast. RADIATION DOSE REDUCTION: This exam was performed according to the departmental dose-optimization program which includes automated exposure control, adjustment of the mA and/or kV according to patient size and/or use of iterative reconstruction technique. COMPARISON:  Brain MRI 10/31/2022 FINDINGS: Brain: Cerebral and globus pallidus insults on comparison brain MRI are essentially occult. No evidence of acute infarction, hemorrhage, hydrocephalus, or mass. Vascular: No hyperdense vessel or unexpected calcification. Skull: Normal. Negative for fracture or focal lesion. Sinuses/Orbits: No acute finding. IMPRESSION: Abnormalities on recent brain MRI are largely occult. No evidence of acute or interval insult. Electronically Signed   By: Tiburcio Pea M.D.   On: 11/15/2022 07:14   DG CHEST PORT 1 VIEW  Result Date: 11/14/2022 CLINICAL DATA:  Shortness of breath. EXAM: PORTABLE CHEST 1 VIEW COMPARISON:  Chest radiograph dated 11/03/2022 and CT dated 11/04/2022. FINDINGS:  Right-sided PICC with tip at the cavoatrial junction. There is trace right pleural effusion and minimal right lung base atelectasis or infiltrate. Overall significant interval improvement of right lung base density since the prior radiograph. The left lung is clear. No pneumothorax. The cardiac silhouette is within normal limits. No acute osseous pathology. IMPRESSION: Trace right pleural effusion and minimal right lung base atelectasis or infiltrate. Electronically Signed   By: Elgie Collard M.D.   On: 11/14/2022 18:09   EEG adult  Result Date: 11/10/2022 Charlsie Quest, MD     11/10/2022  7:01 AM Patient Name: LOURETTA ROUBIDEAUX MRN: [id number] Epilepsy Attending: Charlsie Quest Referring Physician/Provider: Rolly Salter, MD Date:  11/09/2022 Duration: 25 mins Patient history:  47 y.o. female with remote history of polysubstance use who presented after being found down and unresponsive by a bystander. EEG to evaluate for seizure.  Level of alertness: Awake AEDs during EEG study: None Technical aspects: This EEG study was done with scalp electrodes positioned according to the 10-20 International system of electrode placement. Electrical activity was reviewed with band pass filter of 1-70Hz , sensitivity of 7 uV/mm, display speed of 34mm/sec with a 60Hz  notched filter applied as appropriate. EEG data were recorded continuously and digitally stored.  Video monitoring was available and reviewed as appropriate. Description: The posterior dominant rhythm consists of 7 Hz activity of moderate voltage (25-35 uV) seen predominantly in posterior head regions, symmetric and reactive to eye opening and eye closing. EEG showed continuous generalized 5 to 7 Hz theta slowing. On 11/09/2022 at 1654, patient was noted to have episodes of right hand jerking. Concomitant EEG before, during and after the event did not show any EEG changes suggest seizure. Hyperventilation and photic stimulation were not performed.   ABNORMALITY -  Continuous slow, generalized - Background slow IMPRESSION: This study is suggestive of moderate diffuse encephalopathy, nonspecific etiology. No seizures or epileptiform discharges were seen throughout the recording. Charlsie Quest   ECHOCARDIOGRAM COMPLETE  Result Date: 11/06/2022    ECHOCARDIOGRAM REPORT   Patient Name:   ANALEIGHA LOEFFLER Luberto Date of Exam: 11/06/2022 Medical Rec #:  [id number]   Height:       61.0 in Accession #:    [id number]  Weight:       111.6 lb Date of Birth:  Oct 07, 1975   BSA:          1.474 m Patient Age:    47 years    BP:           90/53 mmHg Patient Gender: F           HR:           70 bpm. Exam Location:  Inpatient Procedure: 2D Echo, Pediatric Echo, Color Doppler and Intracardiac Opacification            Agent Indications:    Dyspnea R06.00  History:        Patient has no prior history of Echocardiogram examinations.                 Signs/Symptoms:Dyspnea. Acute respiratory failure, Cavitary                 pneumonia, Substance abuse.  Sonographer:    Aron Baba Referring Phys: [id number] [facility]  Sonographer Comments: Technically difficult study due to poor echo windows. IMPRESSIONS  1. Left ventricular ejection fraction, by estimation, is 60 to 65%. The left ventricle has normal function. The left ventricle has no regional wall motion abnormalities. Left ventricular diastolic parameters were normal.  2. Right ventricular systolic function is normal. The right ventricular size is normal. There is normal pulmonary artery systolic pressure.  3. The mitral valve is normal in structure. Trivial mitral valve regurgitation. No evidence of mitral stenosis.  4. The aortic valve is tricuspid. There is mild calcification of the aortic valve. Aortic valve regurgitation is not visualized. No aortic stenosis is present.  5. The inferior vena cava is normal in size with greater than 50% respiratory variability, suggesting right atrial pressure of 3 mmHg. Conclusion(s)/Recommendation(s): Limited  echo. Valves not seen well. If concern for endocarditis exists would recommend TEE to further evaluate. FINDINGS  Left Ventricle:  Left ventricular ejection fraction, by estimation, is 60 to 65%. The left ventricle has normal function. The left ventricle has no regional wall motion abnormalities. The left ventricular internal cavity size was normal in size. There is  no left ventricular hypertrophy. Left ventricular diastolic parameters were normal. Right Ventricle: The right ventricular size is normal. No increase in right ventricular wall thickness. Right ventricular systolic function is normal. There is normal pulmonary artery systolic pressure. The tricuspid regurgitant velocity is 1.24 m/s, and  with an assumed right atrial pressure of 8 mmHg, the estimated right ventricular systolic pressure is 14.2 mmHg. Left Atrium: Left atrial size was normal in size. Right Atrium: Right atrial size was normal in size. Pericardium: There is no evidence of pericardial effusion. Mitral Valve: The mitral valve is normal in structure. Trivial mitral valve regurgitation. No evidence of mitral valve stenosis. Tricuspid Valve: The tricuspid valve is normal in structure. Tricuspid valve regurgitation is mild . No evidence of tricuspid stenosis. Aortic Valve: The aortic valve is tricuspid. There is mild calcification of the aortic valve. Aortic valve regurgitation is not visualized. No aortic stenosis is present. Pulmonic Valve: The pulmonic valve was normal in structure. Pulmonic valve regurgitation is not visualized. No evidence of pulmonic stenosis. Aorta: The aortic root is normal in size and structure. Venous: The inferior vena cava is normal in size with greater than 50% respiratory variability, suggesting right atrial pressure of 3 mmHg. IAS/Shunts: No atrial level shunt detected by color flow Doppler.  LEFT VENTRICLE PLAX 2D LVIDd:         4.50 cm   Diastology LVIDs:         3.00 cm   LV e' medial:    11.70 cm/s LV PW:          0.80 cm   LV E/e' medial:  7.0 LV IVS:        0.40 cm   LV e' lateral:   13.40 cm/s LVOT diam:     1.60 cm   LV E/e' lateral: 6.1 LV SV:         34 LV SV Index:   23 LVOT Area:     2.01 cm  RIGHT VENTRICLE RV S prime:     17.40 cm/s TAPSE (M-mode): 1.2 cm LEFT ATRIUM             Index        RIGHT ATRIUM          Index LA diam:        2.50 cm 1.70 cm/m   RA Area:     9.34 cm LA Vol (A2C):   26.8 ml 18.19 ml/m  RA Volume:   20.30 ml 13.77 ml/m LA Vol (A4C):   28.9 ml 19.61 ml/m LA Biplane Vol: 30.6 ml 20.76 ml/m  AORTIC VALVE LVOT Vmax:   103.00 cm/s LVOT Vmean:  64.500 cm/s LVOT VTI:    0.171 m  AORTA Ao Root diam: 3.10 cm Ao Asc diam:  2.90 cm MITRAL VALVE               TRICUSPID VALVE MV Area (PHT): 3.46 cm    TR Peak grad:   6.2 mmHg MV Decel Time: 219 msec    TR Vmax:        124.00 cm/s MR Peak grad: 7.3 mmHg MR Vmax:      135.00 cm/s  SHUNTS MV E velocity: 82.30 cm/s  Systemic VTI:  0.17 m MV A velocity: 67.60 cm/s  Systemic Diam: 1.60 cm MV E/A ratio:  1.22 Arvilla Meres MD Electronically signed by Arvilla Meres MD Signature Date/Time: 11/06/2022/5:20:21 PM    Final    [place] EKG SITE RITE  Result Date: 11/04/2022 If Site Rite image not attached, placement could not be confirmed due to current cardiac rhythm.  CT CHEST WO CONTRAST  Result Date: 11/04/2022 CLINICAL DATA:  Pneumonia. EXAM: CT CHEST WITHOUT CONTRAST TECHNIQUE: Multidetector CT imaging of the chest was performed following the standard protocol without IV contrast. RADIATION DOSE REDUCTION: This exam was performed according to the departmental dose-optimization program which includes automated exposure control, adjustment of the mA and/or kV according to patient size and/or use of iterative reconstruction technique. COMPARISON:  10/31/2022 FINDINGS: Cardiovascular: The heart size is normal. No substantial pericardial effusion. Right IJ central line tip projects at the mid to distal SVC level. NG tube extends into the stomach.  Endotracheal tube tip is in the distal trachea. Mediastinum/Nodes: No mediastinal lymphadenopathy. No evidence for gross hilar lymphadenopathy although assessment is limited by the lack of intravenous contrast on the current study. The esophagus has normal imaging features. There is no axillary lymphadenopathy. Lungs/Pleura: 6 mm right middle lobe pulmonary nodule is similar to prior. Right lower lobe consolidative opacity again noted with progression of cavitary component now measuring 3.2 cm with layering fluid level. Patchy nodular left lower lobe airspace disease is similar with progression of confluent component in the paraspinal base. Fine architectural detail of lung parenchyma obscured by breathing motion. Trace bilateral pleural effusions evident. Upper Abdomen: Unremarkable. Musculoskeletal: No worrisome lytic or sclerotic osseous abnormality. IMPRESSION: 1. Interval progression of right lower lobe consolidative opacity with progression of cavitary component now measuring 3.2 cm with layering fluid level. Imaging features compatible with cavitary pneumonia. 2. Patchy nodular left lower lobe airspace disease is similar to prior with progression of confluent component in the paraspinal base. Imaging features compatible with pneumonia. 3. Trace bilateral pleural effusions. 4. 6 mm right middle lobe pulmonary nodule is similar to prior. Non-contrast chest CT at 6-12 months is recommended. If the nodule is stable at time of repeat CT, then future CT at 18-24 months (from today's scan) is considered optional for low-risk patients, but is recommended for high-risk patients. This recommendation follows the consensus statement: Guidelines for Management of Incidental Pulmonary Nodules Detected on CT Images: From the Fleischner Society 2017; Radiology 2017; 284:228-243. Electronically Signed   By: Kennith Center M.D.   On: 11/04/2022 12:37   DG CHEST PORT 1 VIEW  Result Date: 11/03/2022 CLINICAL DATA:  Follow-up  pneumonia EXAM: PORTABLE CHEST 1 VIEW COMPARISON:  11/01/2022 FINDINGS: Cardiac shadow is stable. Endotracheal tube, gastric catheter and right jugular central line are again seen and stable. Small right pleural effusion and basilar atelectasis is seen stable from the prior exam. No new focal abnormality is noted. IMPRESSION: No change from the previous exam. Electronically Signed   By: Alcide Clever M.D.   On: 11/03/2022 10:40   DG Chest Port 1 View  Result Date: 11/01/2022 CLINICAL DATA:  Evaluate for pneumonia. EXAM: PORTABLE CHEST 1 VIEW COMPARISON:  10/31/2022 FINDINGS: ETT tip is stable above the carina. There is a right IJ catheter with tip at the superior cavoatrial junction. Enteric tube courses below the level of the GE junction. Small right pleural effusion. Increased from previous exam. Persistent airspace opacities within the right base concerning for pneumonia. Retrocardiac opacity in the left base is also unchanged. IMPRESSION: 1. Small right pleural effusion. 2. Persistent  bibasilar opacities concerning for pneumonia. 3. Stable support apparatus. Electronically Signed   By: Signa Kell M.D.   On: 11/01/2022 09:00   MR BRAIN WO CONTRAST  Result Date: 10/31/2022 CLINICAL DATA:  Possible hypoxic brain injury EXAM: MRI HEAD WITHOUT CONTRAST TECHNIQUE: Multiplanar, multiecho pulse sequences of the brain and surrounding structures were obtained without intravenous contrast. COMPARISON:  Head CT 10/31/2022 FINDINGS: Brain: There are symmetric areas of hyperintense T2-weighted signal within the globi pallidi. There is multifocal abnormal diffusion restriction within a bilateral deep watershed distribution in the corona radiata. No hydrocephalus. Bilateral small volume subarachnoid hemorrhage. There is questionable hyperintense T2-weighted signal of the parietal cortex bilaterally on the FLAIR sequence. Vascular: Normal flow voids. Skull and upper cervical spine: Normal marrow signal.  Sinuses/Orbits: Negative. Other: None. IMPRESSION: 1. Symmetric signal abnormality within the globi pallidi is most consistent with a toxic metabolic injury, such as carbon monoxide poisoning. 2. 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 3. Small amount of bifrontal subarachnoid hemorrhage, unchanged. Electronically Signed   By: Deatra Robinson M.D.   On: 10/31/2022 23:56   DG Chest Portable 1 View  Result Date: 10/31/2022 CLINICAL DATA:  Line placement EXAM: PORTABLE CHEST 1 VIEW COMPARISON:  X-ray 10/31/2022 and CT. FINDINGS: Hyperinflation. Persistent lung base opacities, right-greater-than-left. Trace right effusion. No pneumothorax or edema. Stable ET tube and enteric tube. New right IJ line with tip along the central SVC. IMPRESSION: New right IJ line.  No pneumothorax. Stable ET tube and enteric tube. Persistent lung base opacities, right-greater-than-left with a tiny right effusion. Previous lucency along left hemithorax is not persist. No definite pneumothorax on the current x-ray. Electronically Signed   By: Karen Kays M.D.   On: 10/31/2022 18:00   CT CHEST ABDOMEN PELVIS WO CONTRAST  Result Date: 10/31/2022 CLINICAL DATA:  Found down EXAM: CT CHEST, ABDOMEN AND PELVIS WITHOUT CONTRAST TECHNIQUE: Multidetector CT imaging of the chest, abdomen and pelvis was performed following the standard protocol without IV contrast. RADIATION DOSE REDUCTION: This exam was performed according to the departmental dose-optimization program which includes automated exposure control, adjustment of the mA and/or kV according to patient size and/or use of iterative reconstruction technique. COMPARISON:  None Available. FINDINGS: CT CHEST FINDINGS Cardiovascular: Normal heart size. No esophagus and thyroid are unremarkable. Pericardial effusion. Normal caliber thoracic aorta with no atherosclerotic disease. Mediastinum/Nodes: Esophagus and thyroid are unremarkable. No enlarged lymph nodes seen in  the chest. Lungs/Pleura: Central airways are patent. Debris seen in the right lower lobe segmental bronchi. Right-greater-than-left lower lobe consolidations. Focal areas of bubbly lucency seen within the right lower lobe consolidation. Numerous centrilobular nodules and ground-glass opacities which are most pronounced in the bilateral lower lobes and posterior portions of the right middle lobe and bilateral upper lobes. Trace right pleural effusion. Musculoskeletal: No chest wall mass or suspicious bone lesions identified. CT ABDOMEN PELVIS FINDINGS Streak artifact related to patient positioning somewhat limits evaluation. Hepatobiliary: No focal liver abnormality. Gallbladder is unremarkable. Pancreas: Visualized portions are unremarkable. Spleen: Normal in size without focal abnormality. Adrenals/Urinary Tract: Bilateral adrenal glands are unremarkable. No hydronephrosis or nephrolithiasis. Bladder is partially decompressed and contains a Foley catheter. Stomach/Bowel: Enteric tube tip is in the distal stomach. Stomach is within normal limits. No evidence of bowel wall thickening, distention, or inflammatory changes. Vascular/Lymphatic: No significant vascular findings are present. No enlarged abdominal or pelvic lymph nodes. Reproductive: IUD in place.  No adnexal mass. Other: No abdominal wall hernia or abnormality. No abdominopelvic ascites. Musculoskeletal: Superior  endplate lucency of L5, likely a Schmorl's node. No aggressive appearing osseous lesions. IMPRESSION: 1. Right-greater-than-left lower lobe consolidations with numerous associated centrilobular nodules and ground-glass opacities. Findings are most consistent with aspiration pneumonia. Focal areas of bubbly lucency are seen within the right lower lobe consolidation, which are concerning for cavitary infection or associated pulmonary infarct. Consider chest CTA to exclude pulmonary embolus. 2. Trace right pleural effusion.  No pneumothorax. 3.  Evaluation of the abdomen and pelvis is limited due to lack of IV contrast and streak artifact related to patient positioning. Within limitations, no acute abnormality of the abdomen or pelvis. Electronically Signed   By: Allegra Lai M.D.   On: 10/31/2022 17:11   CT HEAD WO CONTRAST  Result Date: 10/31/2022 CLINICAL DATA:  Head trauma, minor (Age >= 65y); ams abnormal edema within bilateral EXAM: CT HEAD WITHOUT CONTRAST CT CERVICAL SPINE WITHOUT CONTRAST TECHNIQUE: Multidetector CT imaging of the head and cervical spine was performed following the standard protocol without intravenous contrast. Multiplanar CT image reconstructions of the cervical spine were also generated. RADIATION DOSE REDUCTION: This exam was performed according to the departmental dose-optimization program which includes automated exposure control, adjustment of the mA and/or kV according to patient size and/or use of iterative reconstruction technique. COMPARISON:  None Available. FINDINGS: CT HEAD FINDINGS Brain: Abnormal symmetric edema within bilateral globus palladi. No evidence of acute hemorrhage, mass lesion, midline shift or hydrocephalus. Trace/equivocal hyperdensity along the high bilateral frontal sulci. No mass occupying acute hemorrhage. No hydrocephalus or herniation. Vascular: No hyperdense vessel identified. Skull: No acute fracture. Sinuses/Orbits: Clear sinuses.  No acute orbital findings. Other: No mastoid effusions. CT CERVICAL SPINE FINDINGS Alignment: Rotation of C1 on C2. No substantial sagittal subluxation. Skull base and vertebrae: Vertebral body heights are maintained. No evidence of acute fracture Soft tissues and spinal canal: No prevertebral fluid or swelling. No visible canal hematoma. Disc levels: Mild to moderate multilevel bony degenerative change. Upper chest: Visualized lung apices are clear. IMPRESSION: 1. Abnormal symmetric edema within bilateral globus palladi concerning for acute hypoxic/ischemic  or metabolic/toxic injury. Recommend MRI head to further assess. 2. Trace/equivocal hyperdensity along the high bilateral frontal sulci could be artifactual or trace subarachnoid hemorrhage. No mass effect. Recommend attention on the forthcoming MRI. If MRI is not obtained, recommend follow-up CT to ensure stability. 3. Rotation of C1 on C2, most likely positional in the absence of a fixed torticollis. 4. No evidence of acute fracture in the cervical spine. Electronically Signed   By: Feliberto Harts M.D.   On: 10/31/2022 17:00   CT Cervical Spine Wo Contrast  Result Date: 10/31/2022 CLINICAL DATA:  Head trauma, minor (Age >= 65y); ams abnormal edema within bilateral EXAM: CT HEAD WITHOUT CONTRAST CT CERVICAL SPINE WITHOUT CONTRAST TECHNIQUE: Multidetector CT imaging of the head and cervical spine was performed following the standard protocol without intravenous contrast. Multiplanar CT image reconstructions of the cervical spine were also generated. RADIATION DOSE REDUCTION: This exam was performed according to the departmental dose-optimization program which includes automated exposure control, adjustment of the mA and/or kV according to patient size and/or use of iterative reconstruction technique. COMPARISON:  None Available. FINDINGS: CT HEAD FINDINGS Brain: Abnormal symmetric edema within bilateral globus palladi. No evidence of acute hemorrhage, mass lesion, midline shift or hydrocephalus. Trace/equivocal hyperdensity along the high bilateral frontal sulci. No mass occupying acute hemorrhage. No hydrocephalus or herniation. Vascular: No hyperdense vessel identified. Skull: No acute fracture. Sinuses/Orbits: Clear sinuses.  No acute orbital findings. Other:  No mastoid effusions. CT CERVICAL SPINE FINDINGS Alignment: Rotation of C1 on C2. No substantial sagittal subluxation. Skull base and vertebrae: Vertebral body heights are maintained. No evidence of acute fracture Soft tissues and spinal canal: No  prevertebral fluid or swelling. No visible canal hematoma. Disc levels: Mild to moderate multilevel bony degenerative change. Upper chest: Visualized lung apices are clear. IMPRESSION: 1. Abnormal symmetric edema within bilateral globus palladi concerning for acute hypoxic/ischemic or metabolic/toxic injury. Recommend MRI head to further assess. 2. Trace/equivocal hyperdensity along the high bilateral frontal sulci could be artifactual or trace subarachnoid hemorrhage. No mass effect. Recommend attention on the forthcoming MRI. If MRI is not obtained, recommend follow-up CT to ensure stability. 3. Rotation of C1 on C2, most likely positional in the absence of a fixed torticollis. 4. No evidence of acute fracture in the cervical spine. Electronically Signed   By: Feliberto Harts M.D.   On: 10/31/2022 17:00   DG Chest Port 1 View  Result Date: 10/31/2022 CLINICAL DATA:  Post intubation EXAM: PORTABLE CHEST 1 VIEW COMPARISON:  None Available. FINDINGS: ET tube in place with the tip 3 cm above the carina. Enteric tube extends beneath the diaphragm. Mild opacity along the medial right lower lobe. Minimal medial left lower lobe. Left-sided apical pneumothorax suspected. No effusion or edema. IMPRESSION: ET tube and enteric tube in place. Small left apical pneumothorax. Recommend further workup such as chest CT. Lung base opacities, right-greater-than-left. Critical Value/emergent results were called by telephone at the time of interpretation on 10/31/2022 at 12:55 pm to provider Dr. Gayleen Orem, who verbally acknowledged these results. Electronically Signed   By: Karen Kays M.D.   On: 10/31/2022 16:07       The results of significant diagnostics from this hospitalization (including imaging, microbiology, ancillary and laboratory) are listed below for reference.     Microbiology: No results found for this or any previous visit (from the past 240 hour(s)).   Labs:  CBC: Recent Labs  Lab 11/14/22 1526  11/15/22 0423  WBC 12.5* 10.9*  NEUTROABS 9.5* 7.4  HGB 10.8* 11.3*  HCT 33.0* 34.0*  MCV 97.1 97.7  PLT 395 375   BMP &GFR Recent Labs  Lab 11/12/22 1015 11/13/22 1226 11/14/22 0409 11/15/22 0423  NA 134* 138 134* 136  K 3.2* 2.9* 4.1 3.9  CL 102 114* 102 102  CO2 22 20* 23 24  GLUCOSE 150* 98 99 93  BUN 17 12 15 17   CREATININE 0.55 0.44 0.59 0.64  CALCIUM 7.7* 6.6* 8.3* 8.5*  MG  --  1.4* 2.2  --    Estimated Creatinine Clearance: 64.9 mL/min (by C-G formula based on SCr of 0.64 mg/dL). Liver & Pancreas: Recent Labs  Lab 11/13/22 1303 11/15/22 0423  AST 19 23  ALT 36 38  ALKPHOS 40 41  BILITOT 0.4 0.4  PROT 5.7* 7.4  ALBUMIN <1.5* 2.2*   No results for input(s): "LIPASE", "AMYLASE" in the last 168 hours. No results for input(s): "AMMONIA" in the last 168 hours. Diabetic: No results for input(s): "HGBA1C" in the last 72 hours. Recent Labs  Lab 11/11/22 1956  GLUCAP 114*   Cardiac Enzymes: No results for input(s): "CKTOTAL", "CKMB", "CKMBINDEX", "TROPONINI" in the last 168 hours. No results for input(s): "PROBNP" in the last 8760 hours. Coagulation Profile: No results for input(s): "INR", "PROTIME" in the last 168 hours. Thyroid Function Tests: No results for input(s): "TSH", "T4TOTAL", "FREET4", "T3FREE", "THYROIDAB" in the last 72 hours. Lipid Profile: No results for  input(s): "CHOL", "HDL", "LDLCALC", "TRIG", "CHOLHDL", "LDLDIRECT" in the last 72 hours. Anemia Panel: No results for input(s): "VITAMINB12", "FOLATE", "FERRITIN", "TIBC", "IRON", "RETICCTPCT" in the last 72 hours. Urine analysis:    Component Value Date/Time   COLORURINE AMBER (A) 10/31/2022 1618   APPEARANCEUR TURBID (A) 10/31/2022 1618   LABSPEC 1.024 10/31/2022 1618   PHURINE 5.0 10/31/2022 1618   GLUCOSEU NEGATIVE 10/31/2022 1618   HGBUR MODERATE (A) 10/31/2022 1618   BILIRUBINUR NEGATIVE 10/31/2022 1618   KETONESUR NEGATIVE 10/31/2022 1618   PROTEINUR 100 (A) 10/31/2022 1618    NITRITE NEGATIVE 10/31/2022 1618   LEUKOCYTESUR MODERATE (A) 10/31/2022 1618   Sepsis Labs: Invalid input(s): "PROCALCITONIN", "LACTICIDVEN"   SIGNED:  Almon Hercules, MD  [facility] 11/18/2022, 7:24 AM

## 2022-11-18 NOTE — Progress Notes (Signed)
Patient came onto unit- lethargic but responds to pain stimulus such as when she is being moved.  Vital signs are taken.   When asked questions, patient would open eyes periodically and then fall back asleep. Unable to continue with other questions as she was not able to communicate.

## 2022-11-19 DIAGNOSIS — G931 Anoxic brain damage, not elsewhere classified: Secondary | ICD-10-CM | POA: Diagnosis not present

## 2022-11-19 MED ORDER — CHLORHEXIDINE GLUCONATE CLOTH 2 % EX PADS
6.0000 | MEDICATED_PAD | Freq: Every day | CUTANEOUS | Status: DC
  Administered 2022-11-19 – 2022-11-21 (×3): 6 via TOPICAL

## 2022-11-19 NOTE — Progress Notes (Signed)
PROGRESS NOTE   Subjective/Complaints:  Unable to voice concerns, does say "hi" after several second delay , does not answer Y/N questions   ROS- unable to obtain due to cognition   Objective:   No results found. No results for input(s): "WBC", "HGB", "HCT", "PLT" in the last 72 hours. No results for input(s): "NA", "K", "CL", "CO2", "GLUCOSE", "BUN", "CREATININE", "CALCIUM" in the last 72 hours.  Intake/Output Summary (Last 24 hours) at 11/19/2022 1023 Last data filed at 11/19/2022 0850 Gross per 24 hour  Intake 240 ml  Output --  Net 240 ml     Pressure Injury 10/31/22 Buttocks Left Unstageable - Full thickness tissue loss in which the base of the injury is covered by slough (yellow, tan, gray, green or brown) and/or eschar (tan, brown or black) in the wound bed. (Active)  10/31/22 2000  Location: Buttocks  Location Orientation: Left  Staging: Unstageable - Full thickness tissue loss in which the base of the injury is covered by slough (yellow, tan, gray, green or brown) and/or eschar (tan, brown or black) in the wound bed.  Wound Description (Comments):   Present on Admission: Yes    Physical Exam: Vital Signs Blood pressure 110/82, pulse 95, temperature 98.5 F (36.9 C), resp. rate 15, height 5\' 1"  (1.549 m), SpO2 98 %.  Laying in bed  General: No acute distress Mood and affect are appropriate Heart: Regular rate and rhythm no rubs murmurs or extra sounds Lungs: Clear to auscultation, breathing unlabored, no rales or wheezes Abdomen: Positive bowel sounds, soft nontender to palpation, nondistended Extremities: No clubbing, cyanosis, or edema Skin: No evidence of breakdown, no evidence of rash Neurologic: pt with minimal responsiveness, which impairs ability to perform MMT motor strength is 4/5 in bilateral  grip but poor relaxation  increased tone and no movement to command when testing proximal UE  0/5 Bilateral ,  hip flexor, knee extensors, ankle dorsiflexor and plantar flexor Sensory exam unable to assess other than grimace to pinch of bilateral fingers and toes  Increased elbow flexor tone MAS# , MAS 3 at pecs , LE tone reduced MAS 0  Cerebellar exam unable to assess  Musculoskeletal: reduced bilateral shoulder and elbow ROM due to increased tone    Assessment/Plan: 1. Functional deficits which require 3+ hours per day of interdisciplinary therapy in a comprehensive inpatient rehab setting. Physiatrist is providing close team supervision and 24 hour management of active medical problems listed below. Physiatrist and rehab team continue to assess barriers to discharge/monitor patient progress toward functional and medical goals  Care Tool:  Bathing              Bathing assist       Upper Body Dressing/Undressing Upper body dressing        Upper body assist      Lower Body Dressing/Undressing Lower body dressing            Lower body assist       Toileting Toileting    Toileting assist       Transfers Chair/bed transfer  Transfers assist           Locomotion Ambulation   Ambulation  assist              Walk 10 feet activity   Assist           Walk 50 feet activity   Assist           Walk 150 feet activity   Assist           Walk 10 feet on uneven surface  activity   Assist           Wheelchair     Assist               Wheelchair 50 feet with 2 turns activity    Assist            Wheelchair 150 feet activity     Assist          Blood pressure 110/82, pulse 95, temperature 98.5 F (36.9 C), resp. rate 15, height 5\' 1"  (1.549 m), SpO2 98 %.  Medical Problem List and Plan: 1. 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  Extubated 11/05/2022 Fluctuating level of alertness             -patient may  shower             -ELOS/Goals:  min/modA but prognosis is guarded due to severe cognitive deficits goals 28-30d 2.  Antithrombotics: -DVT/anticoagulation:  Pharmaceutical: Lovenox             -antiplatelet therapy: N/A 3. Pain Management: Tylenol as needed 4. Mood/Behavior/Sleep: Provide emotional support             -antipsychotic agents: N/A 5. Neuropsych/cognition: This patient is not capable of making decisions on her own behalf. 6. Skin/Wound Care: Routine skin checks/left buttock unstageable ulcer.  Apply Medihoney to buttock wound daily cover with foam dressing.  Change dressing every 3 days or as needed soiling 7. Fluids/Electrolytes/Nutrition: Routine in and outs with follow-up chemistries 8.  Cavitary right lower lobe pneumonia likely aspiration pneumonia/MRSA pneumonia.  Continue course of Zyvox as indicated per infectious disease 9.  History of drug abuse.  Positive cocaine on urine drug screen.  Provide counseling 10.  AKI/hypovolemia and ATN.  Renal function much improved.  Follow-up chemistries    Latest Ref Rng & Units 11/15/2022    4:23 AM 11/14/2022    4:09 AM 11/13/2022   12:26 PM  BMP  Glucose 70 - 99 mg/dL 93  99  98   BUN 6 - 20 mg/dL 17  15  12    Creatinine 0.44 - 1.00 mg/dL 1.61  0.96  0.45   Sodium 135 - 145 mmol/L 136  134  138   Potassium 3.5 - 5.1 mmol/L 3.9  4.1  2.9   Chloride 98 - 111 mmol/L 102  102  114   CO2 22 - 32 mmol/L 24  23  20    Calcium 8.9 - 10.3 mg/dL 8.5  8.3  6.6     11.  Mild transaminitis with rhabdomyolysis.  CK peaked at 900 12.  Hypotension.  ProAmatine 5 mg 3 times daily    LOS: 1 days A FACE TO FACE EVALUATION WAS PERFORMED  Erick Colace 11/19/2022, 10:23 AM

## 2022-11-19 NOTE — Plan of Care (Signed)
  Problem: RH Balance Goal: LTG Patient will maintain dynamic sitting balance (PT) Description: LTG:  Patient will maintain dynamic sitting balance with assistance during mobility activities (PT) Flowsheets (Taken 11/19/2022 1642) LTG: Pt will maintain dynamic sitting balance during mobility activities with:: Moderate Assistance - Patient 50 - 74% Goal: LTG Patient will maintain dynamic standing balance (PT) Description: LTG:  Patient will maintain dynamic standing balance with assistance during mobility activities (PT) Flowsheets (Taken 11/19/2022 1642) LTG: Pt will maintain dynamic standing balance during mobility activities with:: Moderate Assistance - Patient 50 - 74%   Problem: Sit to Stand Goal: LTG:  Patient will perform sit to stand with assistance level (PT) Description: LTG:  Patient will perform sit to stand with assistance level (PT) Flowsheets (Taken 11/19/2022 1642) LTG: PT will perform sit to stand in preparation for functional mobility with assistance level: Moderate Assistance - Patient 50 - 74%   Problem: RH Bed Mobility Goal: LTG Patient will perform bed mobility with assist (PT) Description: LTG: Patient will perform bed mobility with assistance, with/without cues (PT). Flowsheets (Taken 11/19/2022 1642) LTG: Pt will perform bed mobility with assistance level of: Moderate Assistance - Patient 50 - 74%   Problem: RH Bed to Chair Transfers Goal: LTG Patient will perform bed/chair transfers w/assist (PT) Description: LTG: Patient will perform bed to chair transfers with assistance (PT). Flowsheets (Taken 11/19/2022 1642) LTG: Pt will perform Bed to Chair Transfers with assistance level: Moderate Assistance - Patient 50 - 74%   Problem: RH Car Transfers Goal: LTG Patient will perform car transfers with assist (PT) Description: LTG: Patient will perform car transfers with assistance (PT). Flowsheets (Taken 11/19/2022 1642) LTG: Pt will perform car transfers with assist:: Moderate  Assistance - Patient 50 - 74%   Problem: RH Ambulation Goal: LTG Patient will ambulate in controlled environment (PT) Description: LTG: Patient will ambulate in a controlled environment, # of feet with assistance (PT). Flowsheets (Taken 11/19/2022 1642) LTG: Pt will ambulate in controlled environ  assist needed:: Moderate Assistance - Patient 50 - 74% LTG: Ambulation distance in controlled environment: 100

## 2022-11-19 NOTE — Evaluation (Signed)
Physical Therapy Assessment and Plan  Patient Details  Name: Nicole [name] MRN: [id number] Date of Birth: Nov 09, 1975  PT Diagnosis: Abnormal posture, Abnormality of gait, Cognitive deficits, Difficulty walking, Hypertonia, Impaired cognition, Impaired sensation, and Muscle weakness Rehab Potential: Fair ELOS: 3-4 weeks   Today's Date: 11/19/2022 PT Individual Time: 0900-1005 PT Individual Time Calculation (min): 65 min    Hospital Problem: Principal Problem:   Hypoxic brain injury (HCC)   Past Medical History:  Past Medical History:  Diagnosis Date   Abnormal pap    w LEEP   Alcoholism (HCC)    Allergy    Anxiety    Asthma    Asthma    Blood in stool    Depression    Depression (emotion)    Frequent headaches    Hepatitis C, chronic (HCC)    hep c tx successful   Irritable bowel syndrome    OSA on CPAP - sees Dr. Jenne Campus in ENT 10/09/2013   Seasonal allergies    scratch test in 2006///mold issue in house   Past Surgical History:  Past Surgical History:  Procedure Laterality Date   DILATION AND CURETTAGE OF UTERUS  1996, 2001, 2002   x 3   LEEP  2008   NASAL FRACTURE SURGERY  1999   repair   NASAL SEPTOPLASTY W/ TURBINOPLASTY     TONSILLECTOMY      Assessment & Plan Clinical Impression: Patient is a 47 y.o. year old female with    Patient currently requires total with mobility secondary to muscle weakness, decreased cardiorespiratoy endurance, impaired timing and sequencing, abnormal tone, unbalanced muscle activation, decreased coordination, and decreased motor planning,  , decreased initiation, decreased attention, decreased awareness, decreased problem solving, decreased safety awareness, and decreased memory, and decreased sitting balance, decreased standing balance, decreased postural control, and decreased balance strategies.  Prior to hospitalization, patient was independent  with mobility and lived with Family in a House home.  Home access is  Level  entry.  Patient will benefit from skilled PT intervention to maximize safe functional mobility, minimize fall risk, and decrease caregiver burden for planned discharge home with 24 hour assist.  Anticipate patient will benefit from follow up [facility] at discharge.  PT - End of Session Activity Tolerance: Tolerates 30+ min activity with multiple rests Endurance Deficit: Yes Endurance Deficit Description: pt lethargic throughout session, requires manual cervical extension to keep eyes open. Pt falling asleep at end of session PT Assessment Rehab Potential (ACUTE/IP ONLY): Fair PT Barriers to Discharge: Inaccessible home environment;Decreased caregiver support;Home environment access/layout;Incontinence;Wound Care;Lack of/limited family support;Medication compliance;Behavior;Nutrition means PT Barriers to Discharge Comments: cognition PT Patient demonstrates impairments in the following area(s): Balance;Behavior;Endurance;Motor;Nutrition;Pain;Safety;Sensory;Skin Integrity PT Transfers Functional Problem(s): Bed Mobility;Bed to Chair;Car PT Locomotion Functional Problem(s): Ambulation;Wheelchair Mobility;Stairs PT Plan PT Intensity: Minimum of 1-2 x/day ,45 to 90 minutes PT Frequency: 5 out of 7 days PT Duration Estimated Length of Stay: 3-4 weeks PT Treatment/Interventions: Ambulation/gait training;Discharge planning;Functional mobility training;Psychosocial support;Therapeutic Activities;Visual/perceptual remediation/compensation;Balance/vestibular training;Disease management/prevention;Neuromuscular re-education;Skin care/wound management;Therapeutic Exercise;Wheelchair propulsion/positioning;Cognitive remediation/compensation;DME/adaptive equipment instruction;Pain management;Splinting/orthotics;UE/LE Strength taining/ROM;Community reintegration;Functional electrical stimulation;Patient/family education;UE/LE Runner, broadcasting/film/video PT Transfers Anticipated Outcome(s): mod A PT  Locomotion Anticipated Outcome(s): max A PT Recommendation Follow Up Recommendations: Home health PT Patient destination: Home Equipment Recommended: To be determined   PT Evaluation Precautions/Restrictions Precautions Precautions: Fall Precaution Comments: Contact precautions & delayed processing Restrictions Weight Bearing Restrictions: No General   Vital SignsTherapy Vitals Temp: 98.3 F (36.8 C) Pulse Rate: (!) 102 Resp: 18 BP: 105/72  Patient Position (if appropriate): Lying Oxygen Therapy SpO2: 98 % O2 Device: Room Air Pain Pain Assessment Pain Scale: Faces Faces Pain Scale:  (Grimace present with bed mobility.) Pain Intervention(s): Repositioned Pain Interference Pain Interference Pain Effect on Sleep: 8. Unable to answer Pain Interference with Therapy Activities: 8. Unable to answer Pain Interference with Day-to-Day Activities: 8. Unable to answer Home Living/Prior Functioning Home Living Available Help at Discharge: Family;Other (Comment) Type of Home: House Home Access: Level entry Home Layout: One level Bathroom Shower/Tub: Health visitor: Standard Additional Comments: Home layout information and PLOF  limited to previously inputted by acute care therapists as pt nonverbal and pt family not in room.  Lives With: Family Prior Function Level of Independence: Independent with gait;Independent with basic ADLs;Independent with homemaking with ambulation;Independent with transfers  Able to Take Stairs?:  (need to verify with family) Driving:  (need to verify with family) Vocation: Full time employment Vocation Requirements: Radiographer, therapeutic Vision/Perception  Vision - History Ability to See in Adequate Light: 0 Adequate Vision - Assessment Additional Comments: Unable to formaly assess due to cognitive deficits. Perception Perception: Not tested Praxis Praxis: Not tested  Cognition Overall Cognitive Status:  Impaired/Different from baseline Arousal/Alertness: Lethargic Orientation Level: Other (comment) (non verbal, unable to follow commands.) Attention: Focused Focused Attention: Impaired Focused Attention Impairment: Verbal basic;Functional basic Sustained Attention: Impaired Sustained Attention Impairment: Verbal basic;Functional basic Memory: Impaired Awareness: Impaired Awareness Impairment: Intellectual impairment Problem Solving: Impaired Problem Solving Impairment: Verbal basic;Functional basic Safety/Judgment: Impaired Sensation Sensation Light Touch:  (Unable to formaly assess due to cognitive deficits.) Additional Comments: Unable to formaly assess due to cognitive deficits. Coordination Gross Motor Movements are Fluid and Coordinated: No Fine Motor Movements are Fluid and Coordinated: No Coordination and Movement Description: Pt limited by inability to follow commands and decreased initiation. Motor  Motor Motor: Abnormal tone;Abnormal postural alignment and control Motor - Skilled Clinical Observations: modified ashworth scale-3 for hip abduction, dorsiflexion, and knee extension   Trunk/Postural Assessment  Cervical Assessment Cervical Assessment: Exceptions to [facility] (severe forward flexion) Thoracic Assessment Thoracic Assessment: Exceptions to [facility] (kyphosis) Lumbar Assessment Lumbar Assessment: Exceptions to [facility] (posterior pelvic tilt) Postural Control Postural Control: Deficits on evaluation Head Control: Impaired Trunk Control: Impaired Righting Reactions: Impaired Protective Responses: Impaired  Balance Balance Balance Assessed: Yes Static Sitting Balance Static Sitting - Level of Assistance: 3: Mod assist (Pt able to sit EOB, but requires mod A to correct posterior sway) Static Sitting - Comment/# of Minutes: 8-10 Extremity Assessment  RUE Assessment RUE Assessment: Exceptions to [facility] RUE Tone RUE Tone: Modified Ashworth Body Part - Modified Ashworth  Scale: Elbow Elbow - Modified Ashworth Scale for Grading Hypertonia RUE: Considerable increase in muschle tone, passive movement difficult Modified Ashworth Scale for Grading Hypertonia RUE: Considerable increase in muschle tone, passive movement difficult LUE Assessment LUE Assessment: Exceptions to WFL LUE Tone LUE Tone: Modified Ashworth Body Part - Modified Ashworth Scale: Elbow Elbow - Modified Ashworth Scale for Grading Hypertonia LUE: Considerable increase in muschle tone, passive movement difficult Modified Ashworth Scale for Grading Hypertonia LUE: Considerable increase in muschle tone, passive movement difficult RLE Assessment RLE Assessment: Exceptions to [facility] General Strength Comments: difficult to assess 2/2 pt inability to follow commands, pt did demo 2/5 knee extension with LAQ, pt requires stabilization of knees bilaterally for standing. RLE Tone RLE Tone: Modified Ashworth Body Part - Modified Ashworth Scale: Hamstrings;Quadriceps;Gastrocnemius Hamstrings - Modified Ashworth Scale for Grading Hypertonia RLE: More marked increase in muscle tone  through most of the ROM, but affected part(s) easily moved QUADRICEPS - Modified Ashworth Scale for Grading Hypertonia RLE: More marked increase in muscle tone through most of the ROM, but affected part(s) easily moved GASTROCNEMIUS - Modified Ashworth Scale for Grading Hypertonia RLE: More marked increase in muscle tone through most of the ROM, but affected part(s) easily moved LLE Assessment LLE Assessment: Exceptions to [facility] LLE Tone LLE Tone: Modified Ashworth Body Part - Modified Ashworth Scale: Hamstrings;Quadriceps;Gastrocnemius Hamstrings - Modified Ashworth Scale for Grading Hypertonia LLE: More marked increase in muscle tone through most of the ROM, but affected part(s) easily moved Quadriceps - Modified Ashworth Scale for Grading Hypertonia LLE: More marked increase in muscle tone through most of the ROM, but affected part(s)  easily moved Gastrocnemius - Modified Ashworth Scale for Grading Hypertonia LLE: More marked increase in muscle tone through most of the ROM, but affected part(s) easily moved  Care Tool Care Tool Bed Mobility Roll left and right activity   Roll left and right assist level: Total Assistance - Patient < 25%    Sit to lying activity   Sit to lying assist level: Total Assistance - Patient < 25%    Lying to sitting on side of bed activity   Lying to sitting on side of bed assist level: the ability to move from lying on the back to sitting on the side of the bed with no back support.: Total Assistance - Patient < 25%     Care Tool Transfers Sit to stand transfer   Sit to stand assist level: 2 Helpers    Chair/bed transfer   Chair/bed transfer assist level: 2 Helpers     Toilet transfer   Assist Level: Total Assistance - Patient < 25%    Licensed conveyancer transfer activity did not occur: Safety/medical concerns        Care Tool Locomotion Ambulation Ambulation activity did not occur: Safety/medical concerns (attempted with 3 musketteers and +2 for WC follow but not safe)        Walk 10 feet activity Walk 10 feet activity did not occur: Safety/medical concerns       Walk 50 feet with 2 turns activity Walk 50 feet with 2 turns activity did not occur: Safety/medical concerns      Walk 150 feet activity Walk 150 feet activity did not occur: Safety/medical concerns      Walk 10 feet on uneven surfaces activity Walk 10 feet on uneven surfaces activity did not occur: Safety/medical concerns      Stairs Stair activity did not occur: Safety/medical concerns        Walk up/down 1 step activity Walk up/down 1 step or curb (drop down) activity did not occur: Safety/medical concerns      Walk up/down 4 steps activity Walk up/down 4 steps activity did not occur: Safety/medical concerns      Walk up/down 12 steps activity Walk up/down 12 steps activity did not occur: Safety/medical  concerns      Pick up small objects from floor   Pick up small object from the floor assist level: Dependent - Patient 0%    Wheelchair Is the patient using a wheelchair?: Yes Type of Wheelchair: Manual   Wheelchair assist level: Dependent - Patient 0%    Wheel 50 feet with 2 turns activity Wheelchair 50 feet with 2 turns activity did not occur: Safety/medical concerns    Wheel 150 feet activity Wheelchair 150 feet activity did not occur: Safety/medical concerns  Refer to Care Plan for Long Term Goals  SHORT TERM GOAL WEEK 1 PT Short Term Goal 1 (Week 1): pt will perform sit<>stand with +2 max A PT Short Term Goal 2 (Week 1): pt will initiate gait training PT Short Term Goal 3 (Week 1): Pt will perform bed<>chair transfer with +2 max A  Recommendations for other services: None   Skilled Therapeutic Intervention Mobility Bed Mobility Bed Mobility: Rolling Right;Rolling Left;Sit to Supine;Supine to Sit Rolling Right: Total Assistance - Patient < 25% Rolling Left: Total Assistance - Patient < 25% Supine to Sit: Total Assistance - Patient < 25% Sit to Supine: Total Assistance - Patient < 25% Transfers Transfers: Sit to Stand;Stand to Sit;Squat Pivot Transfers Sit to Stand: 2 Helpers;Total Assistance - Patient < 25% Stand to Sit: 2 Helpers;Total Assistance - Patient < 25% Squat Pivot Transfers: Total Assistance - Patient < 25%;2 Helpers (performed 1st with 2 people for safety, but able to do with 1 person 2/2 pt small stature) Transfer (Assistive device): None Locomotion  Gait Ambulation: No Stairs / Additional Locomotion Stairs: No Wheelchair Mobility Wheelchair Mobility: Yes Wheelchair Assistance: Dependent - Patient 0% Wheelchair Parts Management: Needs assistance   Discharge Criteria: Patient will be discharged from PT if patient refuses treatment 3 consecutive times without medical reason, if treatment goals not met, if there is a change in medical status, if  patient makes no progress towards goals or if patient is discharged from hospital.  The above assessment, treatment plan, treatment alternatives and goals were discussed and mutually agreed upon: No family available/patient unable  Treatment Session:  Evaluation completed (see details above and below) with education on PT POC and goals and individual treatment initiated with focus on improving attention and initiation.   Pt performed sit<>stand x3 for ~1 min with 3 musketeers with therapist stabilizing B LE to prevent buckling, facilitating trunk and cervical extension. Pt attempted ambualtion with 3 musketeers and +3 for WC follow however unsafe 2/2 poor postural control. Pt may be a good candidate for lite gait to initiate gait training.    [facility], [place], DPT  11/19/2022, 4:35 PM

## 2022-11-19 NOTE — Discharge Instructions (Signed)
Inpatient Rehab Discharge Instructions  Nicole [name] Discharge date and time: No discharge date for patient encounter.   Activities/Precautions/ Functional Status: Activity: As tolerated Diet: Regular Wound Care: Routine skin checks Functional status:  ___ No restrictions     ___ Walk up steps independently ___ 24/7 supervision/assistance   ___ Walk up steps with assistance ___ Intermittent supervision/assistance  ___ Bathe/dress independently ___ Walk with walker     _x__ Bathe/dress with assistance ___ Walk Independently    ___ Shower independently ___ Walk with assistance    ___ Shower with assistance ___ No alcohol     ___ Return to work/school ________  Special Instructions: No driving smoking or alcohol   My questions have been answered and I understand these instructions. I will adhere to these goals and the provided educational materials after my discharge from the hospital.  Patient/Caregiver Signature _______________________________ Date __________  Clinician Signature _______________________________________ Date __________  Please bring this form and your medication list with you to all your follow-up doctor's appointments.

## 2022-11-19 NOTE — Evaluation (Addendum)
Occupational Therapy Assessment and Plan  Patient Details  Name: Nicole [name] MRN: [id number] Date of Birth: 10/25/1975  OT Diagnosis: abnormal posture, acute pain, cognitive deficits, muscle weakness (generalized), and increased BUE/BLE tone Rehab Potential: Rehab Potential (ACUTE ONLY): Fair ELOS: ~4 weeks   Today's Date: 11/19/2022 OT Individual Time: 1660-6301 OT Individual Time Calculation (min): 77 min     Hospital Problem: Principal Problem:   Hypoxic brain injury (HCC)   Past Medical History:  Past Medical History:  Diagnosis Date   Abnormal pap    w LEEP   Alcoholism (HCC)    Allergy    Anxiety    Asthma    Asthma    Blood in stool    Depression    Depression (emotion)    Frequent headaches    Hepatitis C, chronic (HCC)    hep c tx successful   Irritable bowel syndrome    OSA on CPAP - sees Dr. Jenne Campus in ENT 10/09/2013   Seasonal allergies    scratch test in 2006///mold issue in house   Past Surgical History:  Past Surgical History:  Procedure Laterality Date   DILATION AND CURETTAGE OF UTERUS  1996, 2001, 2002   x 3   LEEP  2008   NASAL FRACTURE SURGERY  1999   repair   NASAL SEPTOPLASTY W/ TURBINOPLASTY     TONSILLECTOMY      Assessment & Plan Clinical Impression: Patient is a 47 year old right-handed female with history of asthma as well as anxiety/depression, drug abuse. Per chart review patient lives with her mother. 1 level home with level entry. Family is planning assist as needed. Patient reportedly independent prior to admission. Presented 10/31/2022 after being found down in a field beside a condemned building. Patient unresponsive on arrival to the ED, hypoxic on 15 L nonrebreather mask. Required intubation for airway protection. Cranial CT scan showed abnormal symmetric edema with some bilateral globus paladi concerning for acute hypoxic/ischemic or metabolic/toxic injury. No mass effect. CT cervical spine negative. MRI of the brain symmetric signal  abnormality within the globi pallidi most consistent with toxic metabolic injury. 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 Small amount of bifrontal subarachnoid hemorrhage. EEG negative for seizure. CT of the chest abdomen pelvis showed right greater than left lower lobe consolidation with numerous associated centrilobular nodules and groundglass opacities. Trace right pleural effusion. No pneumothorax. Admission chemistries unremarkable except sodium 161 chloride 124 CO2 21 BUN 79 creatinine 2.57 AST 102 ALT 457 total bilirubin 2.2, WBC 43,600, lactic acid 4.4, CK3 109, BNP 140, urine drug screen positive cocaine, blood cultures no growth to date. Neurosurgery Dr. Jake Samples follow-up for diffuse cerebral edema in the setting of suspected acute hypoxic brain injury no neurosurgical intervention recommended. Infectious disease Dr. Elinor Parkinson consulted in regards to cavitary pneumonia and initially placed on broad-spectrum antibiotics since transitioned to Zyvox.. Leukocytosis much improved latest WBC 10,900 Lovenox added for DVT prophylaxis. Echocardiogram showed no valvular vegetation and ejection fraction of 60 to 65%. TEE not recommended at this time.. Patient was extubated 6/23. Tolerating mechanical soft diet. AKI with multiple electrolyte abnormalities most likely combination of hypovolemia and ATN placed on gentle IV fluids with latest creatinine normalized 0.64. Therapy evaluations completed due to patient decreased functional mobility and altered mental status was admitted for a comprehensive rehab program. Patient transferred to CIR on 11/18/2022 .    Patient currently requires max-total A with basic self-care skills secondary to muscle weakness, decreased cardiorespiratoy endurance, impaired timing and sequencing,  abnormal tone, unbalanced muscle activation, and decreased motor planning, decreased midline orientation, decreased initiation, decreased attention, decreased awareness,  decreased safety awareness, and delayed processing, and decreased sitting balance, decreased standing balance, decreased postural control, and decreased balance strategies.  Prior to hospitalization, patient was independent with all ADLs.  Patient will benefit from skilled intervention to decrease level of assist with basic self-care skills prior to discharge home with care partner.  Anticipate patient will require moderate physical assestance and follow up home health.  OT - End of Session Activity Tolerance: Tolerates < 10 min activity, no significant change in vital signs Endurance Deficit: Yes OT Assessment Rehab Potential (ACUTE ONLY): Fair OT Barriers to Discharge: Decreased caregiver support;Home environment access/layout;Incontinence;Lack of/limited family support OT Patient demonstrates impairments in the following area(s): Balance;Cognition;Endurance;Motor;Pain;Perception;Safety OT Basic ADL's Functional Problem(s): Eating;Grooming;Bathing;Dressing;Toileting OT Transfers Functional Problem(s): Toilet;Tub/Shower OT Additional Impairment(s): Fuctional Use of Upper Extremity OT Plan OT Intensity: Minimum of 1-2 x/day, 45 to 90 minutes OT Frequency: 5 out of 7 days OT Duration/Estimated Length of Stay: ~4 weeks OT Treatment/Interventions: Balance/vestibular training;Cognitive remediation/compensation;Community reintegration;Discharge planning;Disease mangement/prevention;DME/adaptive equipment instruction;Functional electrical stimulation;Functional mobility training;Neuromuscular re-education;Pain management;Patient/family education;Self Care/advanced ADL retraining;Skin care/wound managment;Splinting/orthotics;Therapeutic Activities;Therapeutic Exercise;UE/LE Strength taining/ROM;UE/LE Coordination activities;Visual/perceptual remediation/compensation;Wheelchair propulsion/positioning OT Self Feeding Anticipated Outcome(s): Mod A OT Basic Self-Care Anticipated Outcome(s): Mod A OT  Toileting Anticipated Outcome(s): Mod A OT Bathroom Transfers Anticipated Outcome(s): Mod A OT Recommendation Patient destination: Home Follow Up Recommendations: Home health OT Equipment Recommended: To be determined   OT Evaluation Precautions/Restrictions  Precautions Precautions: Fall Precaution Comments: Contact precautions & delayed processing Restrictions Weight Bearing Restrictions: No General Chart Reviewed: Yes Family/Caregiver Present: No Pain Pain Assessment Pain Scale: Faces Faces Pain Scale:  (Grimace present with bed mobility.) Pain Intervention(s): Repositioned Home Living/Prior Functioning Home Living Available Help at Discharge: Family, Other (Comment) ([name] works x2/week.) Type of Home: House Home Access: Level entry Home Layout: One level Bathroom Shower/Tub: Health visitor: Standard Additional Comments: Home layout information limited to previously inputted by acute care therapists.  Lives With: Family Prior Function Level of Independence: Independent with gait, Independent with basic ADLs, Independent with homemaking with ambulation, Independent with transfers Vocation: Full time employment Science writer) Vision Baseline Vision/History: 0 No visual deficits Ability to See in Adequate Light: 0 Adequate Patient Visual Report: Other (comment) (Unable to formaly assess due to cognitive deficits.) Vision Assessment?: Vision impaired- to be further tested in functional context Additional Comments: Unable to formaly assess due to cognitive deficits. Perception  Perception: Not tested Praxis Praxis: Not tested Cognition Cognition Overall Cognitive Status: Impaired/Different from baseline Arousal/Alertness: Lethargic Memory: Impaired Attention: Focused;Sustained Focused Attention: Impaired Focused Attention Impairment: Verbal basic;Functional basic Sustained Attention: Impaired Sustained Attention Impairment:  Verbal basic;Functional basic Awareness: Impaired Awareness Impairment: Intellectual impairment Problem Solving: Impaired Problem Solving Impairment: Verbal basic;Functional basic Safety/Judgment: Impaired Brief Interview for Mental Status (BIMS) Repetition of Three Words (First Attempt): No answer (Unable to formaly assess due to cognitive deficits.) Temporal Orientation: Year: No answer Temporal Orientation: Month: No answer Temporal Orientation: Day: No answer Recall: "Sock": No answer Recall: "Blue": No answer Recall: "Bed": No answer BIMS Summary Score: 99 Sensation Sensation Light Touch:  (Unable to formaly assess due to cognitive deficits.) Additional Comments: Unable to formaly assess due to cognitive deficits. Coordination Gross Motor Movements are Fluid and Coordinated: No Fine Motor Movements are Fluid and Coordinated: No Coordination and Movement Description: Pt limited by inability to follow commands and decreased initiation. Motor  Motor Motor: Abnormal  tone;Abnormal postural alignment and control Motor - Skilled Clinical Observations: modified ashworth scale-3 for hip abduction, dorsiflexion, and knee extension  Trunk/Postural Assessment  Cervical Assessment Cervical Assessment: Exceptions to [facility] (Severe foward flexion) Thoracic Assessment Thoracic Assessment: Exceptions to WFL (Moderate kyphosis) Lumbar Assessment Lumbar Assessment: Exceptions to [facility] (Posterior pelvic tilt) Postural Control Postural Control: Deficits on evaluation Head Control: Impaired Trunk Control: Impaired Righting Reactions: Impaired Protective Responses: Impaired  Balance Balance Balance Assessed: Yes Static Sitting Balance Static Sitting - Level of Assistance: 3: Mod assist Static Sitting - Comment/# of Minutes: 8-10 Extremity/Trunk Assessment RUE Assessment RUE Assessment: Exceptions to [facility] RUE Tone RUE Tone: Modified Ashworth Body Part - Modified Ashworth Scale: Elbow Elbow  - Modified Ashworth Scale for Grading Hypertonia RUE: Considerable increase in muschle tone, passive movement difficult Modified Ashworth Scale for Grading Hypertonia RUE: Considerable increase in muschle tone, passive movement difficult LUE Assessment LUE Assessment: Exceptions to WFL LUE Tone LUE Tone: Modified Ashworth Body Part - Modified Ashworth Scale: Elbow Elbow - Modified Ashworth Scale for Grading Hypertonia LUE: Considerable increase in muschle tone, passive movement difficult Modified Ashworth Scale for Grading Hypertonia LUE: Considerable increase in muschle tone, passive movement difficult  Care Tool Care Tool Self Care Eating   Eating Assist Level: Dependent - Patient 0%    Oral Care    Oral Care Assist Level: Dependent - Patient 0%)    Bathing     Body parts bathed by helper: Right arm;Left arm;Chest;Abdomen;Front perineal area;Buttocks;Right upper leg;Left upper leg;Right lower leg;Left lower leg;Face   Assist Level: Dependent - Patient 0%    Upper Body Dressing(including orthotics)   What is the patient wearing?: Pull over shirt   Assist Level: Dependent - Patient 0%    Lower Body Dressing (excluding footwear)   What is the patient wearing?: Pants Assist for lower body dressing: Dependent - Patient 0%    Putting on/Taking off footwear   What is the patient wearing?: Socks Assist for footwear: Dependent - Patient 0%       Care Tool Toileting Toileting activity Toileting Activity did not occur (Clothing management and hygiene only): N/A (no void or bm)       Care Tool Bed Mobility Roll left and right activity    Defer to PT eval    Sit to lying activity    Defer to PT eval    Lying to sitting on side of bed activity    Defer to PT eval     Care Tool Transfers Sit to stand transfer    Defer to PT eval    Chair/bed transfer    Defer to PT eval     Toilet transfer   Assist Level: Total Assistance - Patient < 25%     Care Tool  Cognition  Expression of Ideas and Wants Expression of Ideas and Wants: 1. Rarely/Never expressess or very difficult - rarely/never expresses self or speech is very difficult to understand  Understanding Verbal and Non-Verbal Content Understanding Verbal and Non-Verbal Content: 2. Sometimes understands - understands only basic conversations or simple, direct phrases. Frequently requires cues to understand   Memory/Recall Ability Memory/Recall Ability : None of the above were recalled   Refer to Care Plan for Long Term Goals  SHORT TERM GOAL WEEK 1 OT Short Term Goal 1 (Week 1): Pt will initiate simple ADL task with Max multimodal cuing. OT Short Term Goal 2 (Week 1): Pt will attend to ADL task for >2 min in distraction-free environment. OT Short  Term Goal 3 (Week 1): Pt will express preference between two options in preparation for self-advocacy during medical care.  Recommendations for other services: None    Skilled Therapeutic Intervention Pt received resting in bed for skilled OT session with focus on comprehensive OT evaluation. Pt does not verbally communicate with therapist this session, but observed to be grimacing with bed-mobility. OT offering intermediate rest breaks and positioning suggestions throughout session to address pain/fatigue and maximize participation/safety in session.   Session began with introduction to OT role, OT POC, and general orientation to rehab unit/schedule, unsure if patient was receptive to information. Pt incredibly lethargic and presenting with delayed processing impacting participation in bed-level ADLs. Pt overall Max-Total A for all, pt transitions to EOB with Max A, performing squat-pivot to TIS with Max A x2.   Pt remained resting in TIS WC with all immediate needs met at end of session. Pt continues to be appropriate for skilled OT intervention to promote further functional independence.   ADL ADL Eating: Dependent Where Assessed-Eating:  Chair Grooming: Dependent Where Assessed-Grooming: Bed level Upper Body Bathing: Dependent Where Assessed-Upper Body Bathing: Bed level Lower Body Bathing: Dependent Where Assessed-Lower Body Bathing: Bed level Upper Body Dressing: Dependent Where Assessed-Upper Body Dressing: Bed level Lower Body Dressing: Dependent Where Assessed-Lower Body Dressing: Bed level Toileting: Dependent Where Assessed-Toileting: Bed level Toilet Transfer: Unable to assess Psychologist, counselling Transfer: Unable to assess Mobility  Bed Mobility Bed Mobility: Rolling Right;Rolling Left;Sit to Supine;Supine to Sit Rolling Right: Total Assistance - Patient < 25% Rolling Left: Total Assistance - Patient < 25% Supine to Sit: Total Assistance - Patient < 25% Sit to Supine: Total Assistance - Patient < 25% Transfers Sit to Stand: 2 Helpers;Total Assistance - Patient < 25% Stand to Sit: 2 Helpers;Total Assistance - Patient < 25%   Discharge Criteria: Patient will be discharged from OT if patient refuses treatment 3 consecutive times without medical reason, if treatment goals not met, if there is a change in medical status, if patient makes no progress towards goals or if patient is discharged from hospital.  The above assessment, treatment plan, treatment alternatives and goals were discussed and mutually agreed upon: by patient  Lou Cal, OTR/L, MSOT  11/19/2022, 3:21 PM

## 2022-11-19 NOTE — Plan of Care (Signed)
  Problem: RH Balance Goal: LTG: Patient will maintain dynamic sitting balance (OT) Description: LTG:  Patient will maintain dynamic sitting balance with assistance during activities of daily living (OT) Flowsheets (Taken 11/19/2022 1534) LTG: Pt will maintain dynamic sitting balance during ADLs with: Contact Guard/Touching assist   Problem: RH Eating Goal: LTG Patient will perform eating w/assist, cues/equip (OT) Description: LTG: Patient will perform eating with assist, with/without cues using equipment (OT) Flowsheets (Taken 11/19/2022 1534) LTG: Pt will perform eating with assistance level of: Minimal Assistance - Patient > 75%   Problem: RH Grooming Goal: LTG Patient will perform grooming w/assist,cues/equip (OT) Description: LTG: Patient will perform grooming with assist, with/without cues using equipment (OT) Flowsheets (Taken 11/19/2022 1534) LTG: Pt will perform grooming with assistance level of: Moderate Assistance - Patient 50 - 74%   Problem: RH Bathing Goal: LTG Patient will bathe all body parts with assist levels (OT) Description: LTG: Patient will bathe all body parts with assist levels (OT) Flowsheets (Taken 11/19/2022 1534) LTG: Pt will perform bathing with assistance level/cueing: Moderate Assistance - Patient 50 - 74%   Problem: RH Dressing Goal: LTG Patient will perform upper body dressing (OT) Description: LTG Patient will perform upper body dressing with assist, with/without cues (OT). Flowsheets (Taken 11/19/2022 1534) LTG: Pt will perform upper body dressing with assistance level of: Moderate Assistance - Patient 50 - 74% Goal: LTG Patient will perform lower body dressing w/assist (OT) Description: LTG: Patient will perform lower body dressing with assist, with/without cues in positioning using equipment (OT) Flowsheets (Taken 11/19/2022 1534) LTG: Pt will perform lower body dressing with assistance level of: Moderate Assistance - Patient 50 - 74%   Problem: RH  Toileting Goal: LTG Patient will perform toileting task (3/3 steps) with assistance level (OT) Description: LTG: Patient will perform toileting task (3/3 steps) with assistance level (OT)  Flowsheets (Taken 11/19/2022 1534) LTG: Pt will perform toileting task (3/3 steps) with assistance level: Moderate Assistance - Patient 50 - 74%   Problem: RH Toilet Transfers Goal: LTG Patient will perform toilet transfers w/assist (OT) Description: LTG: Patient will perform toilet transfers with assist, with/without cues using equipment (OT) Flowsheets (Taken 11/19/2022 1534) LTG: Pt will perform toilet transfers with assistance level of: Moderate Assistance - Patient 50 - 74%   Problem: RH Tub/Shower Transfers Goal: LTG Patient will perform tub/shower transfers w/assist (OT) Description: LTG: Patient will perform tub/shower transfers with assist, with/without cues using equipment (OT) Flowsheets (Taken 11/19/2022 1534) LTG: Pt will perform tub/shower stall transfers with assistance level of: Moderate Assistance - Patient 50 - 74%

## 2022-11-20 DIAGNOSIS — G931 Anoxic brain damage, not elsewhere classified: Secondary | ICD-10-CM | POA: Diagnosis not present
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
## 2022-11-20 NOTE — Progress Notes (Signed)
Inpatient Rehabilitation  Patient information reviewed and entered into eRehab system by Ji Fairburn M. Aashrith Eves, M.A., CCC/SLP, PPS Coordinator.  Information including medical coding, functional ability and quality indicators will be reviewed and updated through discharge.    

## 2022-11-20 NOTE — Progress Notes (Signed)
Inpatient Rehabilitation Center Individual Statement of Services  Patient Name:  Nicole [name]  Date:  11/20/2022  Welcome to the Inpatient Rehabilitation Center.  Our goal is to provide you with an individualized program based on your diagnosis and situation, designed to meet your specific needs.  With this comprehensive rehabilitation program, you will be expected to participate in at least 3 hours of rehabilitation therapies Monday-Friday, with modified therapy programming on the weekends.  Your rehabilitation program will include the following services:  Physical Therapy (PT), Occupational Therapy (OT), Speech Therapy (ST), 24 hour per day rehabilitation nursing, Therapeutic Recreaction (TR), Neuropsychology, Care Coordinator, Rehabilitation Medicine, Nutrition Services, and Pharmacy Services  Weekly team conferences will be held on Wednesday to discuss your progress.  Your Inpatient Rehabilitation Care Coordinator will talk with you frequently to get your input and to update you on team discussions.  Team conferences with you and your family in attendance may also be held.  Expected length of stay: 4 weeks  Overall anticipated outcome: mod assist level  Depending on your progress and recovery, your program may change. Your Inpatient Rehabilitation Care Coordinator will coordinate services and will keep you informed of any changes. Your Inpatient Rehabilitation Care Coordinator's name and contact numbers are listed  below.  The following services may also be recommended but are not provided by the Inpatient Rehabilitation Center:   Home Health Rehabiltiation Services Outpatient Rehabilitation Services    Arrangements will be made to provide these services after discharge if needed.  Arrangements include referral to agencies that provide these services.  Your insurance has been verified to be:  [employer] Your primary doctor is:  Georganna Skeans  Pertinent information will be  shared with your doctor and your insurance company.  Inpatient Rehabilitation Care Coordinator:  Dossie Der, [place] [phone] or Luna Glasgow  Information discussed with and copy given to patient by: Lucy Chris, 11/20/2022, 9:40 AM

## 2022-11-20 NOTE — Progress Notes (Signed)
Occupational Therapy Session Note  Patient Details  Name: Nicole [name] MRN: [id number] Date of Birth: 1976-02-25  Today's Date: 11/20/2022 OT Individual Time: 4098-1191 OT Individual Time Calculation (min): 59 min    Short Term Goals: Week 1:  OT Short Term Goal 1 (Week 1): Pt will initiate simple ADL task with Max multimodal cuing. OT Short Term Goal 2 (Week 1): Pt will attend to ADL task for >2 min in distraction-free environment. OT Short Term Goal 3 (Week 1): Pt will express preference between two options in preparation for self-advocacy during medical care.  Skilled Therapeutic Interventions/Progress Updates:    Patient received curled in fetal position in bed upon OT arrival.  B soft boots on feet.  Patient with significant neck flexion - active - head very forward.  Patient in supine looked toward therapist's voice and mouthed "morning" in response to greeting - "Good morning!"  Patient with no other attempts at vocalization this session.  Assisted patient to roll left/right to change brief and complete hygiene.  Patient incontinent of small amount of formed stool.  Dressed patient's lower body and assisted patient to seated position- dependently.  Patient with inability to maintain upright seated position at edge of bed.  Worked on propping on BUE to aide with postural control.  Gentle passive range to head/neck and shoulders/trunk while seated.  Transferred patient to tilt in space wheelchair.  Patient passive but not resistive during transfer.  Mom arrived mid session, and asking if patient could get hair washed.  Explained to patient's mom that this would wait in OT session until patient a bit more responsive, or with some head / trunk control.  Mom extremely thankful that patient here in rehab and able to get more therapy.  Patient brought to sink in reclined position to support head.  Worked to align patient in chair in midline with head supported.  Oral care - placed toothbrush in  patient's right hand, and asssited her bring hand to mouth.  Patient opened mouth in anticipation of toothbrush in her hand.  When suction brought toward mouth by therapist - jaw clamped shut.  Need to incorporate use of patient's prior motor patterns for familiar tasks whenever possible.  Patient left up in wheelchair, reclined.  Added tray table for UE support.  Safety belt in place and engaged and call bell nearby.  [name] at bedside.    Therapy Documentation Precautions:  Precautions Precautions: Fall Precaution Comments: Contact precautions & delayed processing Restrictions Weight Bearing Restrictions: No  Pain:  Possible indication of pain- facial expression- with lower leg care - unable to score.       Therapy/Group: Individual Therapy  [name] 11/20/2022, 12:39 PM

## 2022-11-20 NOTE — Plan of Care (Signed)
  Problem: RH Swallowing Goal: LTG Patient will consume least restrictive diet using compensatory strategies with assistance (SLP) Description: LTG:  Patient will consume least restrictive diet using compensatory strategies with assistance (SLP) Flowsheets (Taken 11/20/2022 1713) LTG: Pt Patient will consume least restrictive diet using compensatory strategies with assistance of (SLP): Supervision   Problem: RH Comprehension Communication Goal: LTG Patient will comprehend basic/complex auditory (SLP) Description: LTG: Patient will comprehend basic/complex auditory information with cues (SLP). Flowsheets (Taken 11/20/2022 1713) LTG: Patient will comprehend: Basic auditory information LTG: Patient will comprehend auditory information with cueing (SLP): Moderate Assistance - Patient 50 - 74%   Problem: RH Expression Communication Goal: LTG Patient will verbally express basic/complex needs(SLP) Description: LTG:  Patient will verbally express basic/complex needs, wants or ideas with cues  (SLP) Flowsheets (Taken 11/20/2022 1713) LTG: Patient will verbally express basic/complex needs, wants or ideas (SLP): Moderate Assistance - Patient 50 - 74% Goal: LTG Patient will increase speech intelligibility (SLP) Description: LTG: Patient will increase speech intelligibility at word/phrase/conversation level with cues, % of the time (SLP) Flowsheets (Taken 11/20/2022 1713) LTG: Patient will increase speech intelligibility (SLP): Moderate Assistance - Patient 50 - 74% Level: Phrase   Problem: RH Problem Solving Goal: LTG Patient will demonstrate problem solving for (SLP) Description: LTG:  Patient will demonstrate problem solving for basic/complex daily situations with cues  (SLP) Flowsheets (Taken 11/20/2022 1713) LTG: Patient will demonstrate problem solving for (SLP): Basic daily situations LTG Patient will demonstrate problem solving for: Maximal Assistance - Patient 25 - 49%   Problem: RH Attention Goal:  LTG Patient will demonstrate this level of attention during functional activites (SLP) Description: LTG:  Patient will will demonstrate this level of attention during functional activites (SLP) Flowsheets (Taken 11/20/2022 1713) Patient will demonstrate during cognitive/linguistic activities the attention type of: Sustained LTG: Patient will demonstrate this level of attention during cognitive/linguistic activities with assistance of (SLP): Moderate Assistance - Patient 50 - 74%   Problem: RH Awareness Goal: LTG: Patient will demonstrate awareness during functional activites type of (SLP) Description: LTG: Patient will demonstrate awareness during functional activites type of (SLP) Flowsheets (Taken 11/20/2022 1713) Patient will demonstrate during cognitive/linguistic activities awareness type of: Intellectual LTG: Patient will demonstrate awareness during cognitive/linguistic activities with assistance of (SLP): Maximal Assistance - Patient 25 - 49%

## 2022-11-20 NOTE — Evaluation (Signed)
Speech Language Pathology Assessment and Plan  Patient Details  Name: Nicole [name] MRN: [id number] Date of Birth: Feb 26, 1976  SLP Diagnosis: Cognitive Impairments;Speech and Language deficits;Dysphagia;Aphasia  Rehab Potential: Fair ELOS: 4 weeks    Today's Date: 11/20/2022 SLP Individual Time: 0900-1000 SLP Individual Time Calculation (min): 60 min   Hospital Problem: Principal Problem:   Hypoxic brain injury (HCC)  Past Medical History:  Past Medical History:  Diagnosis Date   Abnormal pap    w LEEP   Alcoholism (HCC)    Allergy    Anxiety    Asthma    Asthma    Blood in stool    Depression    Depression (emotion)    Frequent headaches    Hepatitis C, chronic (HCC)    hep c tx successful   Irritable bowel syndrome    OSA on CPAP - sees Dr. Jenne [name] in ENT 10/09/2013   Seasonal allergies    scratch test in 2006///mold issue in house   Past Surgical History:  Past Surgical History:  Procedure Laterality Date   DILATION AND CURETTAGE OF UTERUS  1996, 2001, 2002   x 3   LEEP  2008   NASAL FRACTURE SURGERY  1999   repair   NASAL SEPTOPLASTY W/ TURBINOPLASTY     TONSILLECTOMY      Assessment / Plan / Recommendation Clinical Impression  Nicole [name] is a 47 year old right-handed female with history of asthma as well as anxiety/depression, drug abuse.  Per chart review patient lives with her mother.  1 level home with level entry.  Family is planning assist as needed.  Patient reportedly independent prior to admission.  Presented 10/31/2022 after being found down in a field beside a condemned building.  Patient unresponsive on arrival to the ED, hypoxic on 15 L nonrebreather mask.  Required intubation for airway protection.  Cranial CT scan showed abnormal symmetric edema with some bilateral globus paladi concerning for acute hypoxic/ischemic or metabolic/toxic injury.  No mass effect.  CT cervical spine negative.  MRI of the brain symmetric signal abnormality within the globi  pallidi most consistent with toxic metabolic injury.  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  Small amount of bifrontal subarachnoid hemorrhage.  E CT of the chest abdomen pelvis showed right greater than left lower lobe consolidation with numerous associated centrilobular nodules and groundglass opacities.  Trace right pleural effusion.  No pneumothorax.  Admission chemistries unremarkable except sodium 161 chloride 124 CO2 21 BUN 79 creatinine 2.57 AST 102 ALT 457 total bilirubin 2.2, WBC 43,600, lactic acid 4.4, CK3 109, BNP 140, urine drug screen positive cocaine, blood cultures no growth to date.  Neurosurgery Dr. Jake [name] follow-up for diffuse cerebral edema in the setting of suspected acute hypoxic brain injury no neurosurgical intervention recommended.  Therapy evaluations completed due to patient decreased functional mobility and altered mental status was admitted for a comprehensive rehab program. Pt admitted to CIR on 11/18/22.  Pt presented with largely functional swallow in response to current diet of dys 3 and thin liquids. Pt alert and seated in bed with postural control impeding upright position. Pt lying mostly on her left side curled almost in a fetal position with her head tucked down. Trials administered by this SLP with pt presenting with adequate labial seal, bolus stripping, lateralization, mastication, transit and deglutition with min to no lingual residue. She indep initiated lingual sweeps across oral cavity as needed. Pt consumed thin liquids intermittently across session. She accepted single and  consecutive sips of thin liquid via straw without impairment. Per nurse she consumed meds crushed in puree without difficulty. SLP recommending continue Dys 3 solid and thin liquids with full supervision.   Informal assessment of cognition and language completed due to limited participation and/or severity of deficits. Pt did not respond to any open ended questions. She  responded x1  by gesture to close ended question regarding food by shaking her head affirmatively. Per nurse, pt responded to single question regarding pain earlier in am. She did not initiate any communication, maintain eye contact or track during session. Pt presents with a bite reflex as indicated by attempting to place all items placed in her hand in her mouth (I.e. hair brush, call button, and toothbrush). Pt appropriately placed toothbrush in her mouth (likely incidental) however, observed to bite toothbrush without achieving functional use of object. SLP able to provide oral hygiene while gently maneuvering toothbrush from constant bite reflex. Due to severity of communication deficits, a formal cognitive evaluation was not administered. However, deficits in sustained attention, initiation, and awareness were observed during informal evaluation/functional tasks.   Pt would benefit from skilled SLP services to maximize dysphagia, cognition, language, and motor speech disorders in order to maximize his independence prior to discharge. Anticipate pt will require 24 hour supervision at home and f/u home health SLP services at discharge.   Skilled Therapeutic Interventions          BSE and informal assessment measures administered. Please see full report for additional details.     SLP Assessment  Patient will need skilled Speech Lanaguage Pathology Services during CIR admission    Recommendations  SLP Diet Recommendations: Dysphagia 3 (Mech soft);Thin Liquid Administration via: Straw Medication Administration: Crushed with puree Supervision: Staff to assist with self feeding Compensations: Minimize environmental distractions;Small sips/bites Postural Changes and/or Swallow Maneuvers: Upright 30-60 min after meal Oral Care Recommendations: Oral care BID Patient destination: Home Follow up Recommendations: Home Health SLP;24 hour supervision/assistance Equipment Recommended: To be determined     SLP Frequency 3 to 5 out of 7 days   SLP Duration  SLP Intensity  SLP Treatment/Interventions 4 weeks  Minumum of 1-2 x/day, 30 to 90 minutes  Cognitive remediation/compensation;Dysphagia/aspiration precaution training;Internal/external aids;Speech/Language facilitation;Cueing hierarchy;Environmental controls;Therapeutic Activities;Functional tasks;Multimodal communication approach;Patient/family education;Therapeutic Exercise;DME/adaptive equipment instruction    Pain Pain Assessment Pain Scale: 0-10 Pain Score: 0-No pain  Prior Functioning Cognitive/Linguistic Baseline: Within functional limits Type of Home: House  Lives With: Family Available Help at Discharge: Family;Other (Comment) (mother) Education: some college Vocation: Full time employment  SLP Evaluation Cognition Overall Cognitive Status: Impaired/Different from baseline Arousal/Alertness: Lethargic Orientation Level:  (Unable to assess- nonverbal) Year: Other (Comment) (nonverbal) Month:  (nonverbal) Day of Week: Other (Comment) (nonverbal) Attention: Sustained Focused Attention: Impaired Sustained Attention: Impaired Memory: Impaired Awareness: Impaired Awareness Impairment: Intellectual impairment Problem Solving: Impaired Safety/Judgment: Impaired  Comprehension Auditory Comprehension Overall Auditory Comprehension: Impaired Yes/No Questions: Impaired Commands: Impaired Interfering Components: Processing speed (nonverbal) EffectiveTechniques: Extra processing time;Pausing;Repetition Visual Recognition/Discrimination Discrimination: Not tested Reading Comprehension Reading Status: Unable to assess (comment) Word level: Unable to assess (comment) Sentence Level: Not tested Paragraph Level: Not tested Functional Environmental (signs, name badge): Not tested Interfering Components: Processing time (nonverbal) Expression Expression Primary Mode of Expression: Nonverbal - gestures Verbal  Expression Overall Verbal Expression: Impaired Initiation: Impaired Level of Generative/Spontaneous Verbalization:  (nonverbal) Repetition: Impaired Naming: Impairment Pragmatics: Unable to assess Non-Verbal Means of Communication: Gestures Written Expression Dominant Hand: Right Oral Motor Oral Motor/Sensory Function Overall Oral  Motor/Sensory Function: Mild impairment Facial ROM: Reduced left;Suspected CN VII (facial) dysfunction Facial Symmetry: Abnormal symmetry left;Suspected CN VII (facial) dysfunction Motor Speech Respiration: Impaired Intelligibility: Unable to assess (comment) Motor Planning: Not tested  Care Tool Care Tool Cognition Ability to hear (with hearing aid or hearing appliances if normally used Ability to hear (with hearing aid or hearing appliances if normally used): 0. Adequate - no difficulty in normal conservation, social interaction, listening to TV   Expression of Ideas and Wants Expression of Ideas and Wants: 1. Rarely/Never expressess or very difficult - rarely/never expresses self or speech is very difficult to understand   Understanding Verbal and Non-Verbal Content Understanding Verbal and Non-Verbal Content: 2. Sometimes understands - understands only basic conversations or simple, direct phrases. Frequently requires cues to understand  Memory/Recall Ability Memory/Recall Ability : None of the above were recalled    Bedside Swallowing Assessment General Diet Prior to this Study: Dysphagia 3 (mechanical soft);Thin liquids (Level 0) Temperature Spikes Noted: No Respiratory Status: Room air History of Recent Intubation: Yes Total duration of intubation (days): 5 days Date extubated: 11/05/22 Behavior/Cognition: Alert;Lethargic/Drowsy;Doesn't follow directions Oral Cavity - Dentition: Adequate natural dentition Self-Feeding Abilities: Total assist Vision: Functional for self-feeding Patient Positioning: Upright in bed (poorpostural  control) Volitional Swallow: Unable to elicit  Oral Care Assessment Oral Assessment  (WDL): Exceptions to WDL Lips: Symmetrical Tongue: Pink;Moist Mucous Membrane(s): Moist;Pink Saliva: Moist, saliva free flowing Level of Consciousness: Alert Is patient on any of following O2 devices?: None of the above Nutritional status: Dysphagia Oral Assessment Risk : High Risk Ice Chips Ice chips: Not tested Thin Liquid Thin Liquid: Within functional limits Nectar Thick Nectar Thick Liquid: Not tested Honey Thick Honey Thick Liquid: Not tested Puree Puree: Not tested Solid Solid: Within functional limits Presentation: Self Fed BSE Assessment Risk for Aspiration Other Related Risk Factors: Cognitive impairment;Prolonged intubation;Lethargy;Deconditioning  Short Term Goals: Week 1: SLP Short Term Goal 1 (Week 1): Patient will utilize multimodal communication to answer basic yes/no questions with Max A multimodal cues in 25% of opportunities SLP Short Term Goal 2 (Week 1): Patient will follow 1-step commands within a context in 25% of opportunities with Max A multimodal cues. SLP Short Term Goal 3 (Week 1): Patient will vocalize on command in 25% of opportunities with Max A multimodal cues. SLP Short Term Goal 4 (Week 1): Pt will present w/ appropriate response to functional stimuli (i.e. accept spoon, grasp basic item appropriately, response to tactile stimuli)  Refer to Care Plan for Long Term Goals  Recommendations for other services: None   Discharge Criteria: Patient will be discharged from SLP if patient refuses treatment 3 consecutive times without medical reason, if treatment goals not met, if there is a change in medical status, if patient makes no progress towards goals or if patient is discharged from hospital.  The above assessment, treatment plan, treatment alternatives and goals were discussed and mutually agreed upon: No family available/patient unable  [name] 11/20/2022, 5:11 PM

## 2022-11-20 NOTE — Progress Notes (Signed)
Arrived to room. Patient is currently not in the room at this time. Notified nurse to re-consult when patient is in the room and ready for IV team. Tomasita Morrow, RN VAST

## 2022-11-20 NOTE — Progress Notes (Signed)
Physical Therapy Session Note  Patient Details  Name: Nicole [name] MRN: [id number] Date of Birth: 03/24/1976  Today's Date: 11/20/2022 PT Individual Time: 7829-5621 PT Individual Time Calculation (min): 71 min   Short Term Goals: Week 1:  PT Short Term Goal 1 (Week 1): pt will perform sit<>stand with +2 max A PT Short Term Goal 2 (Week 1): pt will initiate gait training PT Short Term Goal 3 (Week 1): Pt will perform bed<>chair transfer with +2 max A  Skilled Therapeutic Interventions/Progress Updates:     Pt reclined in TIS w/c with her mother at the bedside. Collected PLOF and social information from mother as patient nonverbal and without vocalizations during treatment. Sitting posture in TIS very flexed and forward, with B arms flexed into chest, significant cervical neck flexion with chin tucked to chest. Eyes open.   Transported in w/c to main rehab gym and assisted to mat table with +1 dependent squat pivot transfer. Pt requiring max/totalA for static sitting balance - provided with 4" support platform under her LE's to assisst with weight bearing and balance but no improvement. Tried to work on functional and similar task simulation but patient not responding to commands or having much spontaneous movements.   Lied her down into supine with dependent assist to stretch her into extension. Pt needing pillow to support head due to her significant flexed cervical spine. Did some butterfly stretching, B arm extension in supine position. Added rolling to her L with dependent assist to address functional bed mobility.   Pt assisted back to sitting EOM with dependent assist and then returned to TIS w/c with dependent assist via squat<>pivot transfer. Pt noted to be soiled so returned to her room and assisted back to bed in similar manner. Female NT called in to assist with pericare. All needs met at end of treatment.    Therapy Documentation Precautions:  Precautions Precautions:  Fall Precaution Comments: Contact precautions & delayed processing Restrictions Weight Bearing Restrictions: No General:     Therapy/Group: Individual Therapy  Orrin Brigham 11/20/2022, 7:52 AM

## 2022-11-20 NOTE — Progress Notes (Addendum)
PROGRESS NOTE   Subjective/Complaints: No new complaints this morning Sleepy Patient's chart reviewed- No issues reported overnight Hypotensive  ROS- Unable to obtain due to cognition   Objective:   No results found. No results for input(s): "WBC", "HGB", "HCT", "PLT" in the last 72 hours. No results for input(s): "NA", "K", "CL", "CO2", "GLUCOSE", "BUN", "CREATININE", "CALCIUM" in the last 72 hours.  Intake/Output Summary (Last 24 hours) at 11/20/2022 1126 Last data filed at 11/19/2022 1743 Gross per 24 hour  Intake 720 ml  Output --  Net 720 ml     Pressure Injury 10/31/22 Buttocks Left Unstageable - Full thickness tissue loss in which the base of the injury is covered by slough (yellow, tan, gray, green or brown) and/or eschar (tan, brown or black) in the wound bed. (Active)  10/31/22 2000  Location: Buttocks  Location Orientation: Left  Staging: Unstageable - Full thickness tissue loss in which the base of the injury is covered by slough (yellow, tan, gray, green or brown) and/or eschar (tan, brown or black) in the wound bed.  Wound Description (Comments):   Present on Admission: Yes    Physical Exam: Vital Signs Blood pressure (!) 106/90, pulse 93, temperature 98.1 F (36.7 C), resp. rate 16, height 5\' 1"  (1.549 m), SpO2 99 %.  Laying in bed, somnolent General: No acute distress Mood and affect are appropriate Heart: Regular rate and rhythm no rubs murmurs or extra sounds Lungs: Clear to auscultation, breathing unlabored, no rales or wheezes Abdomen: Positive bowel sounds, soft nontender to palpation, nondistended Extremities: No clubbing, cyanosis, or edema Skin: No evidence of breakdown, no evidence of rash Neurologic: pt with minimal responsiveness, which impairs ability to perform MMT motor strength is 4/5 in bilateral  grip but poor relaxation  increased tone and no movement to command when testing proximal  UE  0/5 Bilateral , hip flexor, knee extensors, ankle dorsiflexor and plantar flexor Sensory exam unable to assess other than grimace to pinch of bilateral fingers and toes  Increased elbow flexor tone MAS# , MAS 3 at pecs , LE tone reduced MAS 0  Cerebellar exam unable to assess  Musculoskeletal: reduced bilateral shoulder and elbow ROM due to increased tone    Assessment/Plan: 1. Functional deficits which require 3+ hours per day of interdisciplinary therapy in a comprehensive inpatient rehab setting. Physiatrist is providing close team supervision and 24 hour management of active medical problems listed below. Physiatrist and rehab team continue to assess barriers to discharge/monitor patient progress toward functional and medical goals  Care Tool:  Bathing        Body parts bathed by helper: Right arm, Left arm, Chest, Abdomen, Front perineal area, Buttocks, Right upper leg, Left upper leg, Right lower leg, Left lower leg, Face     Bathing assist Assist Level: Dependent - Patient 0%     Upper Body Dressing/Undressing Upper body dressing   What is the patient wearing?: Pull over shirt    Upper body assist Assist Level: Dependent - Patient 0%    Lower Body Dressing/Undressing Lower body dressing      What is the patient wearing?: Pants     Lower body assist  Assist for lower body dressing: Dependent - Patient 0%     Toileting Toileting Toileting Activity did not occur Press photographer and hygiene only): N/A (no void or bm)  Toileting assist       Transfers Chair/bed transfer  Transfers assist     Chair/bed transfer assist level: 2 Helpers     Locomotion Ambulation   Ambulation assist   Ambulation activity did not occur: Safety/medical concerns (attempted with 3 musketteers and +2 for WC follow but not safe)          Walk 10 feet activity   Assist  Walk 10 feet activity did not occur: Safety/medical concerns        Walk 50 feet  activity   Assist Walk 50 feet with 2 turns activity did not occur: Safety/medical concerns         Walk 150 feet activity   Assist Walk 150 feet activity did not occur: Safety/medical concerns         Walk 10 feet on uneven surface  activity   Assist Walk 10 feet on uneven surfaces activity did not occur: Safety/medical concerns         Wheelchair     Assist Is the patient using a wheelchair?: Yes Type of Wheelchair: Manual    Wheelchair assist level: Dependent - Patient 0%      Wheelchair 50 feet with 2 turns activity    Assist    Wheelchair 50 feet with 2 turns activity did not occur: Safety/medical concerns       Wheelchair 150 feet activity     Assist  Wheelchair 150 feet activity did not occur: Safety/medical concerns       Blood pressure (!) 106/90, pulse 93, temperature 98.1 F (36.7 C), resp. rate 16, height 5\' 1"  (1.549 m), SpO2 99 %.  Medical Problem List and Plan: 1. 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 Extubated 11/05/2022 Fluctuating level of alertness             -patient may  shower             -ELOS/Goals: min/modA but prognosis is guarded due to severe cognitive deficits goals 28-30d  Messaged Melissa to ensure she was assigned to the right team given brain injury 2.  Antithrombotics: -DVT/anticoagulation:  Pharmaceutical: Lovenox             -antiplatelet therapy: N/A 3. Pain Management: Tylenol as needed 4. Mood/Behavior/Sleep: Provide emotional support             -antipsychotic agents: N/A 5. Neuropsych/cognition: This patient is not capable of making decisions on her own behalf. 6. Left buttock unstageable ulcer.  Continue Medihoney to buttock wound daily cover with foam dressing.  Change dressing every 3 days or as needed soiling  7. Fluids/Electrolytes/Nutrition: Routine in and outs with follow-up chemistries  8.  Cavitary right lower lobe  pneumonia likely aspiration pneumonia/MRSA pneumonia.  Continue course of Zyvox as indicated per infectious disease  9.  History of drug abuse.  Positive cocaine on urine drug screen.  Provide counseling  10.  AKI/hypovolemia and ATN.  Renal function much improved.  Repeat Cr today.    Latest Ref Rng & Units 11/15/2022    4:23 AM 11/14/2022    4:09 AM 11/13/2022   12:26 PM  BMP  Glucose 70 - 99 mg/dL 93  99  98   BUN 6 - 20 mg/dL 17  15  12  Creatinine 0.44 - 1.00 mg/dL 1.61  0.96  0.45   Sodium 135 - 145 mmol/L 136  134  138   Potassium 3.5 - 5.1 mmol/L 3.9  4.1  2.9   Chloride 98 - 111 mmol/L 102  102  114   CO2 22 - 32 mmol/L 24  23  20    Calcium 8.9 - 10.3 mg/dL 8.5  8.3  6.6     11.  Mild transaminitis with rhabdomyolysis.  CK peaked at 900, add on to today's labs  12.  Hypotension.  Continue ProAmatine 5 mg 3 times daily    LOS: 2 days A FACE TO FACE EVALUATION WAS PERFORMED  Leveta Wahab P Ahren Pettinger 11/20/2022, 11:26 AM

## 2022-11-20 NOTE — Progress Notes (Signed)
PICC line removed, per MD order. Bedrest post removal until 1615. Keep dressing clean, dry, and intact for 24 hours.  PICC catheter tip visualized and intact. Pressure held until hemostasis achieved. Pressure dressing applied. No redness, ecchymosis, edema, swelling, or drainage noted at site.

## 2022-11-21 DIAGNOSIS — G931 Anoxic brain damage, not elsewhere classified: Secondary | ICD-10-CM | POA: Diagnosis not present

## 2022-11-21 LAB — CK: Total CK: 118 U/L (ref 38–234)

## 2022-11-21 LAB — VITAMIN D 25 HYDROXY (VIT D DEFICIENCY, FRACTURES): Vit D, 25-Hydroxy: 52.41 ng/mL (ref 30–100)

## 2022-11-21 MED ORDER — AMANTADINE HCL 100 MG PO CAPS
100.0000 mg | ORAL_CAPSULE | Freq: Every day | ORAL | Status: DC
  Administered 2022-11-21 – 2022-11-22 (×2): 100 mg via ORAL
  Filled 2022-11-21 (×2): qty 1

## 2022-11-21 MED ORDER — MIDODRINE HCL 5 MG PO TABS
5.0000 mg | ORAL_TABLET | Freq: Two times a day (BID) | ORAL | Status: DC
  Administered 2022-11-21 – 2022-11-24 (×6): 5 mg via ORAL
  Filled 2022-11-21 (×6): qty 1

## 2022-11-21 NOTE — Progress Notes (Signed)
Occupational Therapy Session Note  Patient Details  Name: Nicole [name] MRN: [id number] Date of Birth: 03/23/1976  Today's Date: 11/21/2022 OT Individual Time: 1030-1059 OT Individual Time Calculation (min): 29 min    Short Term Goals: Week 1:  OT Short Term Goal 1 (Week 1): Pt will initiate simple ADL task with Max multimodal cuing. OT Short Term Goal 2 (Week 1): Pt will attend to ADL task for >2 min in distraction-free environment. OT Short Term Goal 3 (Week 1): Pt will express preference between two options in preparation for self-advocacy during medical care.  Skilled Therapeutic Interventions/Progress Updates:     Pt received sleeping in [facility] with head resting to the R in flexed position with shoulders rounded. Pt waking upon OT arrival- opening eyes, however no verbal response. Pt presenting to be in 0/10 pain OT offering intermittent rest breaks, repositioning, and therapeutic support to optimize participation in therapy session. Removed tray table from [facility] and placed Pt in front of mirror for external visual feedback and to engage Pt in functional head turning and BADL tasks. Provided gentle passive range to head/neck with Pt seated in TISWC to prevent onset of pain/contracture and promote increased ROM and control. Placed TISWC in reclined position to decrease stress placed on neck extensors, however Pt unable to hold head in midline without assist. Applied warm washcloth to pt's face with Pt noted to turn head towards wash cloth in response to stimuli. Engaged [name] RUE in assisting with task through [facility] When releasing from [name] R hand, Pt continued to grasp wash cloth, however not purposefully continuing task at this time. Placed familiar items of tooth brush in [name] hand to facilitate moving through familiar movement patterns. Pt maintaining mouth closed and unreceptive to using suction toothbrush. OT initiate brushing [name] hair and then engaged Pt in moving through brushing movement  pattern. Pt with significant tone in BUEs with LUE>RUE limiting ROM for movement. Pt did fixate gaze on hair brush and OT continued task of brushing hair to support moral. Pt was left resting in TISWC in reclined position with pillow placed on R shoulder to support neck and promote [name] head moving towards midline. Call bell in reach, seat belt alarm on, and all needs met.    Therapy Documentation Precautions:  Precautions Precautions: Fall Precaution Comments: Contact precautions & delayed processing Restrictions Weight Bearing Restrictions: No    Therapy/Group: Individual Therapy  [name] 11/21/2022, 7:42 AM

## 2022-11-21 NOTE — Progress Notes (Signed)
PROGRESS NOTE   Subjective/Complaints: Eating with assistance of aide No signs of pain Poor initiation- will try amantadine.  BP reviewed and is normal  ROS- Unable to obtain due to cognition   Objective:   No results found. No results for input(s): "WBC", "HGB", "HCT", "PLT" in the last 72 hours. No results for input(s): "NA", "K", "CL", "CO2", "GLUCOSE", "BUN", "CREATININE", "CALCIUM" in the last 72 hours.  Intake/Output Summary (Last 24 hours) at 11/21/2022 1140 Last data filed at 11/20/2022 1403 Gross per 24 hour  Intake 200 ml  Output --  Net 200 ml     Pressure Injury 10/31/22 Buttocks Left Unstageable - Full thickness tissue loss in which the base of the injury is covered by slough (yellow, tan, gray, green or brown) and/or eschar (tan, brown or black) in the wound bed. (Active)  10/31/22 2000  Location: Buttocks  Location Orientation: Left  Staging: Unstageable - Full thickness tissue loss in which the base of the injury is covered by slough (yellow, tan, gray, green or brown) and/or eschar (tan, brown or black) in the wound bed.  Wound Description (Comments):   Present on Admission: Yes    Physical Exam: Vital Signs Blood pressure 125/84, pulse 94, temperature 98.1 F (36.7 C), resp. rate 16, height 5\' 1"  (1.549 m), SpO2 99 %.  Laying in bed, eating food with aide's assistance General: No acute distress Mood and affect are appropriate Heart: Regular rate and rhythm no rubs murmurs or extra sounds Lungs: Clear to auscultation, breathing unlabored, no rales or wheezes Abdomen: Positive bowel sounds, soft nontender to palpation, nondistended Extremities: No clubbing, cyanosis, or edema Skin: No evidence of breakdown, no evidence of rash Neurologic: pt with minimal responsiveness, which impairs ability to perform MMT motor strength is 4/5 in bilateral  grip but poor relaxation  increased tone and no movement to  command when testing proximal UE  0/5 Bilateral , hip flexor, knee extensors, ankle dorsiflexor and plantar flexor Sensory exam unable to assess other than grimace to pinch of bilateral fingers and toes  Increased elbow flexor tone MAS# , MAS 3 at pecs , LE tone reduced MAS 0  Cerebellar exam unable to assess  Musculoskeletal: reduced bilateral shoulder and elbow ROM due to increased tone    Assessment/Plan: 1. Functional deficits which require 3+ hours per day of interdisciplinary therapy in a comprehensive inpatient rehab setting. Physiatrist is providing close team supervision and 24 hour management of active medical problems listed below. Physiatrist and rehab team continue to assess barriers to discharge/monitor patient progress toward functional and medical goals  Care Tool:  Bathing        Body parts bathed by helper: Right arm, Left arm, Chest, Abdomen, Front perineal area, Buttocks, Right upper leg, Left upper leg, Right lower leg, Left lower leg, Face     Bathing assist Assist Level: Dependent - Patient 0%     Upper Body Dressing/Undressing Upper body dressing   What is the patient wearing?: Pull over shirt    Upper body assist Assist Level: Dependent - Patient 0%    Lower Body Dressing/Undressing Lower body dressing      What is the patient  wearing?: Pants     Lower body assist Assist for lower body dressing: Dependent - Patient 0%     Toileting Toileting Toileting Activity did not occur Press photographer and hygiene only): N/A (no void or bm)  Toileting assist Assist for toileting: 2 Helpers     Transfers Chair/bed transfer  Transfers assist  Chair/bed transfer activity did not occur: Safety/medical concerns  Chair/bed transfer assist level: 2 Helpers     Locomotion Ambulation   Ambulation assist   Ambulation activity did not occur: Safety/medical concerns (attempted with 3 musketteers and +2 for WC follow but not safe)          Walk  10 feet activity   Assist  Walk 10 feet activity did not occur: Safety/medical concerns        Walk 50 feet activity   Assist Walk 50 feet with 2 turns activity did not occur: Safety/medical concerns         Walk 150 feet activity   Assist Walk 150 feet activity did not occur: Safety/medical concerns         Walk 10 feet on uneven surface  activity   Assist Walk 10 feet on uneven surfaces activity did not occur: Safety/medical concerns         Wheelchair     Assist Is the patient using a wheelchair?: Yes Type of Wheelchair: Manual    Wheelchair assist level: Dependent - Patient 0%      Wheelchair 50 feet with 2 turns activity    Assist    Wheelchair 50 feet with 2 turns activity did not occur: Safety/medical concerns       Wheelchair 150 feet activity     Assist  Wheelchair 150 feet activity did not occur: Safety/medical concerns       Blood pressure 125/84, pulse 94, temperature 98.1 F (36.7 C), resp. rate 16, height 5\' 1"  (1.549 m), SpO2 99 %.  Medical Problem List and Plan: 1. 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 Extubated 11/05/2022 Fluctuating level of alertness             -patient may  shower             -ELOS/Goals: min/modA but prognosis is guarded due to severe cognitive deficits goals 28-30d  Therapy notes reviewed  2.  Antithrombotics: -DVT/anticoagulation:  Pharmaceutical: Lovenox             -antiplatelet therapy: N/A 3. Pain Management: Tylenol as needed 4. Mood/Behavior/Sleep: Provide emotional support             -antipsychotic agents: N/A 5. Neuropsych/cognition: This patient is not capable of making decisions on her own behalf. 6. Left buttock unstageable ulcer.  Continue Medihoney to buttock wound daily cover with foam dressing.  Change dressing every 3 days or as needed soiling  7. Fluids/Electrolytes/Nutrition: Routine in and outs with  follow-up chemistries  8.  Cavitary right lower lobe pneumonia likely aspiration pneumonia/MRSA pneumonia.  Continue course of Zyvox as indicated per infectious disease  9.  History of drug abuse.  Positive cocaine on urine drug screen.  Provide counseling  10.  AKI/hypovolemia and ATN.  Renal function much improved.  Repeat Cr today.    Latest Ref Rng & Units 11/15/2022    4:23 AM 11/14/2022    4:09 AM 11/13/2022   12:26 PM  BMP  Glucose 70 - 99 mg/dL 93  99  98   BUN 6 - 20 mg/dL  17  15  12    Creatinine 0.44 - 1.00 mg/dL 0.86  5.78  4.69   Sodium 135 - 145 mmol/L 136  134  138   Potassium 3.5 - 5.1 mmol/L 3.9  4.1  2.9   Chloride 98 - 111 mmol/L 102  102  114   CO2 22 - 32 mmol/L 24  23  20    Calcium 8.9 - 10.3 mg/dL 8.5  8.3  6.6     11.  Mild transaminitis with rhabdomyolysis.  CK reviewed and normalized  12.  Hypotension.  Improving, decrease ProAmatine to 5mg  BID with meals.   13. Impaired initiation: start amantadine 100mg  daily.   14. Screening for vitamin D deficiency: check vitamin D level today.     LOS: 3 days A FACE TO FACE EVALUATION WAS PERFORMED  Mikalah Skyles P Yohanna Tow 11/21/2022, 11:40 AM

## 2022-11-21 NOTE — Progress Notes (Signed)
Speech Language Pathology Daily Session Note  Patient Details  Name: Nicole [name] MRN: [id number] Date of Birth: [birth year]  Today's Date: 11/21/2022 SLP Individual Time: 1345-1440 SLP Individual Time Calculation (min): 55 min  Short Term Goals: Week 1: SLP Short Term Goal 1 (Week 1): Patient will utilize multimodal communication to answer basic yes/no questions with Max A multimodal cues in 25% of opportunities SLP Short Term Goal 2 (Week 1): Patient will follow 1-step commands within a context in 25% of opportunities with Max A multimodal cues. SLP Short Term Goal 3 (Week 1): Patient will vocalize on command in 25% of opportunities with Max A multimodal cues. SLP Short Term Goal 4 (Week 1): Pt will present w/ appropriate response to functional stimuli (i.e. accept spoon, grasp basic item appropriately, response to tactile stimuli)  Skilled Therapeutic Interventions:  Pt was seen in PM to address cognitive re- training. Pt was alert upon SLP arrival and seated upright in TIS WC with head tucked and looking toward the floor. Pt was responsive to this clinician's knock at the door c/b turning eyes toward sound. [name] arrived during session and participated intermittently throughout. SLP challenging pt in initiation of communication through providing opportunities for successful exchanges. Pt challenged through presentation. Options presented via close ended questions, binary option, and open ended questions with additional time allowed between opportunities. She accepted spoon from SLP and appropriately placed in oral cavity as opposed to biting which was observed on eval. Pt followed 1 step verbal commands with hand over hand assist in all opportunities. SLP presented visual stimulus of yes and no and challenged pt in answering biographical questions. No observed initiation or nonverbal communication in response to prompts. Per pt's mother, she presents with decreased use of language in comparison  to days in acute and step down. Per mother, pt mostly responded appropriately with decreased vocal intensity and occasionally spoke in phrases. She was able to write some with unknown context of written communication. Education provided to pt's mother at close of session regarding team conference, updates, and use of preferred music and activities to possibly elicit initiation. Pt left in TIS WC with her mother present, chair alarm active, and call button within reach.  Pain Pain Assessment Pain Scale: Faces Pain Score: 0-No pain  Therapy/Group: Individual Therapy  [name] 11/21/2022, 3:55 PM

## 2022-11-21 NOTE — Progress Notes (Signed)
Patients mother would like for patient to have a shower and wash her hair.   Nicole [name]

## 2022-11-21 NOTE — Progress Notes (Signed)
Physical Therapy Session Note  Patient Details  Name: Nicole [name] MRN: [id number] Date of Birth: 12/01/1975  Today's Date: 11/21/2022 PT Individual Time: 0900-1013 PT Individual Time Calculation (min): 73 min   Short Term Goals: Week 1:  PT Short Term Goal 1 (Week 1): pt will perform sit<>stand with +2 max A PT Short Term Goal 2 (Week 1): pt will initiate gait training PT Short Term Goal 3 (Week 1): Pt will perform bed<>chair transfer with +2 max A  Skilled Therapeutic Interventions/Progress Updates:      Pt supine in bed with NT assisting with feeding - direct hand off of care to PT with patient finished with her breakfast. No signs of pain or discomfort. Donned thigh-TED's with dependent assist at bed level. Completed supine<>sitting EOB with dependent assist, allowed time for initiation but none made. MaxA for sitting balance and then completed a squat pivot transfer with dependent assist from EOB to TIS w/c, with 1 person assist. Patient "folding" into significant trunk flexion during transfer.  Dependently transferred to day room rehab gym. Setup Tilt Table to work on progressive weight bearing and upright tolerance. Completed dependent lift (+3 assist) from TIS w/c to mat table. Pillow needed for her forward head posturing.   Gradually increasing patient from flat, up to 65 degrees. BP checked periodically (every 20deg) and remained stable. PT needing to assist with placing her feet onto foot board to encourage weight bearing. Tone in heel cord limiting heel contact with foot plate until reaching the 65 degrees. In semi-standing, worked on head control, cervical ROM, B arm extension, and passive ROM while getting weight bearing through BLE's.   Pt may benefit from an elbow bledsoe, botox, or soft/air casting for her contractures she's developing in B elbows. Will follow up with team conference.  Patient dependently assisted back to TIS w/c via +3 assist and then returned to her room  where she ended session reclined in TIS w/c with full lap tray on. All needs met.     Therapy Documentation Precautions:  Precautions Precautions: Fall Precaution Comments: Contact precautions & delayed processing Restrictions Weight Bearing Restrictions: No General:     Therapy/Group: Individual Therapy  [name] [name]  PT, DPT, CSRS  11/21/2022, 7:43 AM

## 2022-11-21 NOTE — IPOC Note (Signed)
Overall Plan of Care [facility]) Patient Details Name: Nicole [name] MRN: [id number] DOB: 08-03-1975  Admitting Diagnosis: Hypoxic brain injury [facility])  Hospital Problems: Principal Problem:   Hypoxic brain injury [facility])     Functional Problem List: Nursing Bladder, Bowel, Endurance, Medication Management, Nutrition, Safety, Skin Integrity  PT Balance, Behavior, Endurance, Motor, Nutrition, Pain, Safety, Sensory, Skin Integrity  OT Balance, Cognition, Endurance, Motor, Pain, Perception, Safety  SLP Cognition, Linguistic, Motor, Nutrition  TR         Basic ADL's: OT Eating, Grooming, Bathing, Dressing, Toileting     Advanced  ADL's: OT       Transfers: PT Bed Mobility, Bed to Chair, Customer service manager, Tub/Shower     Locomotion: PT Ambulation, Psychologist, prison and probation services, Stairs     Additional Impairments: OT Fuctional Use of Upper Extremity  SLP Swallowing, Communication, Social Cognition comprehension, expression Problem Solving, Memory, Attention, Awareness  TR      Anticipated Outcomes Item Anticipated Outcome  Self Feeding Mod A  Swallowing  Sup A   Basic self-care  Mod A  Toileting  Mod A   Bathroom Transfers Mod A  Bowel/Bladder  manage bowel w mod I and bladder w toileting assistance  Transfers  mod A  Locomotion  max A  Communication  Mod A  Cognition  Max A  Pain  n/a  Safety/Judgment  manage w cues   Therapy Plan: PT Intensity: Minimum of 1-2 x/day ,45 to 90 minutes PT Frequency: 5 out of 7 days PT Duration Estimated Length of Stay: 3-4 weeks OT Intensity: Minimum of 1-2 x/day, 45 to 90 minutes OT Frequency: 5 out of 7 days OT Duration/Estimated Length of Stay: ~4 weeks SLP Intensity: Minumum of 1-2 x/day, 30 to 90 minutes SLP Frequency: 3 to 5 out of 7 days SLP Duration/Estimated Length of Stay: 4 weeks   Team Interventions: Nursing Interventions Disease Management/Prevention, Bladder Management, Medication Management, Discharge Planning, Skin  Care/Wound Management, Bowel Management, Patient/Family Education, Dysphagia/Aspiration Precaution Training  PT interventions Ambulation/gait training, Discharge planning, Functional mobility training, Psychosocial support, Therapeutic Activities, Visual/perceptual remediation/compensation, Balance/vestibular training, Disease management/prevention, Neuromuscular re-education, Skin care/wound management, Therapeutic Exercise, Wheelchair propulsion/positioning, Cognitive remediation/compensation, DME/adaptive equipment instruction, Pain management, Splinting/orthotics, UE/LE Strength taining/ROM, Community reintegration, Development worker, international aid stimulation, Patient/family education, UE/LE Coordination activities, Museum/gallery curator  OT Interventions Warden/ranger, Cognitive remediation/compensation, Firefighter, Discharge planning, Disease mangement/prevention, Fish farm manager, Functional electrical stimulation, Functional mobility training, Neuromuscular re-education, Pain management, Patient/family education, Self Care/advanced ADL retraining, Skin care/wound managment, Splinting/orthotics, Therapeutic Activities, Therapeutic Exercise, UE/LE Strength taining/ROM, UE/LE Coordination activities, Visual/perceptual remediation/compensation, Wheelchair propulsion/positioning  SLP Interventions Cognitive remediation/compensation, Dysphagia/aspiration precaution training, Internal/external aids, Speech/Language facilitation, Cueing hierarchy, Environmental controls, Therapeutic Activities, Functional tasks, Multimodal communication approach, Patient/family education, Therapeutic Exercise, DME/adaptive equipment instruction  TR Interventions    SW/CM Interventions Discharge Planning, Psychosocial Support, Patient/Family Education   Barriers to Discharge MD  Medical stability  Nursing Lack of/limited family support, Wound Care 1 level/level entry w mother who works 2days/week   PT Inaccessible home environment, Decreased caregiver support, Home environment access/layout, Incontinence, Wound Care, Lack of/limited family support, Medication compliance, Behavior, Nutrition means cognition  OT Decreased caregiver support, Home environment access/layout, Incontinence, Lack of/limited family support    SLP      SW Insurance for SNF coverage     Team Discharge Planning: Destination: PT-Home ,OT- Home , SLP-Home Projected Follow-up: PT-Home health PT, OT-  Home health OT, SLP-Home Health SLP, 24 hour supervision/assistance Projected Equipment Needs: PT-To be  determined, OT- To be determined, SLP-To be determined Equipment Details: PT- , OT-  Patient/family involved in discharge planning: PT- Patient unable/family or caregiver not available,  OT-Patient, SLP-Patient  MD ELOS: Acute hypoxic brain injury Medical Rehab Prognosis:  Excellent Assessment: The patient has been admitted for CIR therapies with the diagnosis of acute hypoxic brain injury. The team will be addressing functional mobility, strength, stamina, balance, safety, adaptive techniques and equipment, self-care, bowel and bladder mgt, patient and caregiver education. Goals have been set at Min/mod A. Anticipated discharge destination is home.        See Team Conference Notes for weekly updates to the plan of care

## 2022-11-21 NOTE — Progress Notes (Signed)
Physical Therapy Session Note  Patient Details  Name: Nicole [name] MRN: [id number] Date of Birth: Sep 19, 1975  Today's Date: 11/21/2022 PT Individual Time: 1105-1130 PT Individual Time Calculation (min): 25 min   Short Term Goals: Week 1:  PT Short Term Goal 1 (Week 1): pt will perform sit<>stand with +2 max A PT Short Term Goal 2 (Week 1): pt will initiate gait training PT Short Term Goal 3 (Week 1): Pt will perform bed<>chair transfer with +2 max A  Skilled Therapeutic Interventions/Progress Updates:    Pt presents in TIS w/c with eyes closed and flexed posture with head significantly forward flexed and flexed to the R supported on pillow. Pt did not respond when greeted and maintained eyes closed. Transported patient outside of room to therapy gym to more stimulating environment to help promote engagement. Pt did open eyes during session in gym space but did not track or follow commands. Sat patient more upright in w/c to 90 degrees in attempt to promote some kind of truncal response or activation but it did not. Set patient in semi reclined position to decrease work load for cervical extension but maintained eyes open during attempt to have open fingers in extension and palms on soft beach ball on lap tray. Significant tone noted in LUE > RUE maintaining flexion and adduction. Various attempts at engagement to ellicit some kind of purposeful response. Pt arms maintained position but with attempt to move ball passively pt's tendency to draw back into a flexed position. Passive and gentle stretch to neck/head during activity to promote midline alignment. End of session left up in TIS w/c and positioned with pillows for improved positioning/alignment and posture.   Therapy Documentation Precautions:  Precautions Precautions: Fall Precaution Comments: Contact precautions & delayed processing Restrictions Weight Bearing Restrictions: No   Pain:  Pt unable to state. Does not appear to be in pain.      Therapy/Group: Individual Therapy  Karolee Stamps Darrol Poke, PT, DPT, CBIS  11/21/2022, 12:03 PM

## 2022-11-22 DIAGNOSIS — G931 Anoxic brain damage, not elsewhere classified: Secondary | ICD-10-CM | POA: Diagnosis not present

## 2022-11-22 MED ORDER — BACLOFEN 5 MG HALF TABLET
5.0000 mg | ORAL_TABLET | Freq: Three times a day (TID) | ORAL | Status: DC
  Administered 2022-11-22 – 2022-11-23 (×4): 5 mg via ORAL
  Filled 2022-11-22 (×4): qty 1

## 2022-11-22 MED ORDER — AMANTADINE HCL 100 MG PO CAPS
100.0000 mg | ORAL_CAPSULE | Freq: Two times a day (BID) | ORAL | Status: DC
  Administered 2022-11-23 – 2022-11-26 (×7): 100 mg via ORAL
  Filled 2022-11-22 (×7): qty 1

## 2022-11-22 MED ORDER — VITAMIN D 25 MCG (1000 UNIT) PO TABS
1000.0000 [IU] | ORAL_TABLET | Freq: Every day | ORAL | Status: DC
  Administered 2022-11-22 – 2023-02-13 (×84): 1000 [IU] via ORAL
  Filled 2022-11-22 (×84): qty 1

## 2022-11-22 NOTE — Progress Notes (Addendum)
Patient ID: Nicole [name], female   DOB: [birth year], 47 y.o.   MRN: [id number]  Referral sent to [facility] to begin the SSD/SSI application with Mom.   10:16 am Will possibly need SNF so will begin the process of [facility] since will be a level 2 due medical issues.

## 2022-11-22 NOTE — Patient Care Conference (Signed)
Inpatient RehabilitationTeam Conference and Plan of Care Update Date: 11/22/2022   Time: 11:10 AM    Patient Name: Nicole [name]      Medical Record Number: [id number]  Date of Birth: [birth year] Sex: Female         Room/Bed: 4W09C/4W09C-01 Payor Info: Payor: [employer] / Plan: Penni Bombard PLAN / Product Type: *No Product type* /    Admit Date/Time:  11/18/2022  3:15 PM  Primary Diagnosis:  Hypoxic brain injury [facility])  Hospital Problems: Principal Problem:   Hypoxic brain injury [facility])    Expected Discharge Date: Expected Discharge Date: 12/01/22 (? SNF)  Team Members Present: Physician leading conference: Dr. Sula Soda Social Worker Present: Dossie Der, LCSW Nurse Present: Chana Bode, RN PT Present: Wynelle Link, PT OT Present: Bretta Bang, OT SLP Present: Feliberto Gottron, SLP PPS Coordinator present : Fae Pippin, SLP     Current Status/Progress Goal Weekly Team Focus  Bowel/Bladder   Incontinent of b/b   Regain continence   Toilet Q3-4hr    Swallow/Nutrition/ Hydration   Dys 3 and thin liquids- pt tolerating diet well. Has been limited by dependence for assist with feeding. Likely could consume regular solids without difficulty   Supervision  trials of regular    ADL's   Dependent and minimally responsive to BADL   Mod assist   improve arousal, focused attention    Mobility   Dependent care for all functional mobility. Nonverbal and no attempts at vocalization noted. Severely flexed posturing.   ModA (monitoring need to downgrade to maxA at w/c level)  Bed mobility, sitting balance, improving alertness and volitional movements, functional transfers, stretching into extension    Communication   ? language vs. cognition deficits total A with little to no initiation of communication. Patient is currently nonverbal without vocalizations   Mod A for expression and comprehension   initate, respond to yes/ no, follow commands     Safety/Cognition/ Behavioral Observations  total A with little to no initiation   mod to max A   sustained attention, initiation of functional tasks    Pain   non verbal, grimaces when moving, PRN tylenol administered   Pain < 3/10   Assess Qshift and prn    Skin   Unstageable to buttocks.   Promote healing and prevention of further breakdown  Assess qshift and prn      Discharge Planning:  If can reach min assist Mom could manage but goals look like max assist. Will need to pursue SNF, will be difficult placement due to insurance, drug history and is considered young. Mom very supportive and here often. Have begun SSD application, trying to find out if her insurance covers SNF   Team Discussion: Patient with increased spasticity, increased tone and contractures forming, poor arousal, poor initiation, no attention or eye contact post hypoxic brain injury.  Limited attempts to communicate at present with change/decline in cognitive status since admission.  Patient on target to meet rehab goals: no, currently dependent for all care. Needs max assist - total assit for sitting balance and shows minimal response to therapeutic activities. Currently on a Dysphagia 3 texture, thin liquid diet and actually able to consume a good portion of a meal.   *See Care Plan and progress notes for long and short-term goals.   Revisions to Treatment Plan:  MRI for cognitive decline assessment Palliative care consult Tilt table/Kreg bed Night bath Amantadine trial   Teaching Needs: Safety, nutritional means, medications, skin care,  transfers, toileting, etc  Current Barriers to Discharge: Home enviroment access/layout, Incontinence, Lack of/limited family support, and Nutritional means  Possible Resolutions to Barriers: Family education SNF recommended     Medical Summary Current Status: spasticity, bowel and bladder incontinence, hypotension, decreased initiation  Barriers to Discharge:  Medical stability;Neurogenic Bowel & Bladder  Barriers to Discharge Comments: spasticity, bowel and bladder incontinence, hypotension, decreased initation Possible Resolutions to [employer] Focus: add baclofen 5mg  TID, bowel and bladder program, continue midodrine 5mg  BID and titrate as needed, start amantadine   Continued Need for Acute Rehabilitation Level of Care: The patient requires daily medical management by a physician with specialized training in physical medicine and rehabilitation for the following reasons: Direction of a multidisciplinary physical rehabilitation program to maximize functional independence : Yes Medical management of patient stability for increased activity during participation in an intensive rehabilitation regime.: Yes Analysis of laboratory values and/or radiology reports with any subsequent need for medication adjustment and/or medical intervention. : Yes   I attest that I was present, lead the team conference, and concur with the assessment and plan of the team.   Chana Bode B 11/22/2022, 3:50 PM

## 2022-11-22 NOTE — Progress Notes (Signed)
Physical Therapy Session Note  Patient Details  Name: Nicole [name] MRN: [id number] Date of Birth: 1975/09/04  Today's Date: 11/22/2022 PT Individual Time: 0900-0957 PT Individual Time Calculation (min): 57 min   Short Term Goals: Week 1:  PT Short Term Goal 1 (Week 1): pt will perform sit<>stand with +2 max A PT Short Term Goal 2 (Week 1): pt will initiate gait training PT Short Term Goal 3 (Week 1): Pt will perform bed<>chair transfer with +2 max A  Skilled Therapeutic Interventions/Progress Updates:      Pt in bed to start. Opens eyes to voice and makes brief eye contact. Made an attempt at vocalizing but low vocal quality and minimal output - no other attempts made during treatment. Donned thigh-high compression socks dependently at bed level.   Supine<>sitting EOB with +1 dependent assist, using bed linen to assist with transitional movement. Completed +1 dependent squat pivot transfer from EOB into TIS w/c with +2 assist on standby. Reclined in TIS and then transported to day room rehab gym.  Setup Tilt Table to continue working on upright tolerance and weight bearing through BLE. Completed +3 dependent lift from TIS w/c onto mat table and then dependently positioned. X3 straps applied for safety during tilting.  Gradually increased tilt from 0deg up to 65deg in 10-15deg increments. BP checked after each increase and remained stable throughout with a slight drop at 65deg to 107/88. PT assisting managing BLE to encourage weight bearing but unable to get B heels to make contact with foot plate due to tone. In modified standing, also added cervical stretching, B arm stretching into extension. Grimacing with PROM, especially when returning to flexed position post stretch.  MD arriving during rounds and discussed concerns regarding tone in all x4 extremities and possible bracing. Also discussed ordering KREG bed for tilting in room.   Pt dependently transferred back to TIS w/c with +3  assist. Returned to her room and reclined in TIS w/c with full lap tray and pillows supporting her. All needs met.   Therapy Documentation Precautions:  Precautions Precautions: Fall Precaution Comments: Contact precautions & delayed processing Restrictions Weight Bearing Restrictions: No General:      Therapy/Group: Individual Therapy  Orrin Brigham 11/22/2022, 7:48 AM

## 2022-11-22 NOTE — Progress Notes (Signed)
Occupational Therapy Session Note  Patient Details  Name: Nicole [name] MRN: [id number] Date of Birth: [birth year]  Today's Date: 11/22/2022 OT Individual Time: 1500-1600 OT Individual Time Calculation (min): 60 min    Short Term Goals: Week 1:  OT Short Term Goal 1 (Week 1): Pt will initiate simple ADL task with Max multimodal cuing. OT Short Term Goal 2 (Week 1): Pt will attend to ADL task for >2 min in distraction-free environment. OT Short Term Goal 3 (Week 1): Pt will express preference between two options in preparation for self-advocacy during medical care.  Skilled Therapeutic Interventions/Progress Updates:  Pt greeted seated in TIS with pts mother present. Pt did not respond to therapist arrival, however mother reports she feels like pt is aware when mother visits.  Pt completed squat pivot back to bed with total A, no initiation noted for transfer. From EOB worked on sitting balance for ADL participation. Pt with a brief moment of CGA for sitting balance however no righting reactions noted when losing balance. Pt did smile at therapist once pts head was lifted in neutral position.   Provide passive stretch to neck from EOB via R and L rotation. Pt required total A transition to supine. From supine provided passive stretch to bilateral elbows d/t increased tone. Of note pt grimaces to pain on L side when UE is bent but not when straightened out. LUE with increased tone vs RUE. Additionally provide passive neck stretching from supine d/t spasticity.   Noted pt soiled through pants. NT entered to assist with dependent brief change.   Therapy Documentation Precautions:  Precautions Precautions: Fall Precaution Comments: Contact precautions & delayed processing Restrictions Weight Bearing Restrictions: No  Pain: pt noted to grimace to pain during stretching, provided rest breaks as needed and decreased ROM for pain mgmt.    Therapy/Group: Individual Therapy  [name]  [facility] 11/22/2022, 4:25 PM

## 2022-11-22 NOTE — Progress Notes (Signed)
Patient ID: Nicole [name], female   DOB: 15-Sep-1975, 47 y.o.   MRN: [id number]  Met with Mom when here visiting to give her a team conference update of goals of mod/max level and will evaluate in tow weeks and see where we are Discussed at the level pt is at Mom will not be able to provide the care she needs. Informed her MD to order MRI due to declined with speech from acute. Mom did say she was talking more on acute and since coming to rehab has not spoken. Pt did smile while worker was in her room she knew we were talking about her. Mom would like to talk with the MD regarding medical questions and an update, worker will message MD to ask her to call Mom. Mom did talk with [facility] and has an appointment 7/25 phone interview to begin SSD/SSI applications. Did discuss spoke with the insurance regarding coverage for SNF if needed and would need to find a SNF facility to take pt and then get insurance approval. Mom aware insurance, age and substance abuse history would make it more difficult to obtain a bed for her. Will continue to work on discharge need and see if pt can participate more in therapies.

## 2022-11-22 NOTE — Progress Notes (Signed)
Speech Language Pathology Daily Session Note  Patient Details  Name: ANALYS RYDEN MRN: [id number] Date of Birth: [birth year]  Today's Date: 11/22/2022 SLP Individual Time: 1005-1015 SLP Individual Time Calculation (min): 10 min and Today's Date: 11/22/2022 SLP Missed Time: 35 Minutes Missed Time Reason: Patient fatigue  Short Term Goals: Week 1: SLP Short Term Goal 1 (Week 1): Patient will utilize multimodal communication to answer basic yes/no questions with Max A multimodal cues in 25% of opportunities SLP Short Term Goal 2 (Week 1): Patient will follow 1-step commands within a context in 25% of opportunities with Max A multimodal cues. SLP Short Term Goal 3 (Week 1): Patient will vocalize on command in 25% of opportunities with Max A multimodal cues. SLP Short Term Goal 4 (Week 1): Pt will present w/ appropriate response to functional stimuli (i.e. accept spoon, grasp basic item appropriately, response to tactile stimuli)  Skilled Therapeutic Interventions:   Pt greeted in room, reclined in TIS chair. Attempted to complete cog tx. Difficult to wake, though able to open her eyes momentarily. Responsive to sternal rub, but was unable to remain awake despite wet washcloth to face and tactile stimuli (attempted to give pt items to grasp/manipulate. Suspect fatigue from PT tx session negatively impacted pt's ability to participate. Nursing notified of fatigue and pt remained in reclined position in her chair. Call light within reach. Recommend cont ST per POC.   Pain  Unable to report pain - appeared comfortable  Therapy/Group: Individual Therapy  [name], M.S. CCC-SLP 11/22/2022, 7:51 AM

## 2022-11-22 NOTE — Progress Notes (Signed)
PROGRESS NOTE   Subjective/Complaints: In gym on tilt table working with PT Developing contractures, start baclofen 5mg  TID, BP reviewed and is soft and amantadine was also started today  ROS- +contracture development as per PT  Objective:   No results found. No results for input(s): "WBC", "HGB", "HCT", "PLT" in the last 72 hours. No results for input(s): "NA", "K", "CL", "CO2", "GLUCOSE", "BUN", "CREATININE", "CALCIUM" in the last 72 hours.  Intake/Output Summary (Last 24 hours) at 11/22/2022 1023 Last data filed at 11/22/2022 0900 Gross per 24 hour  Intake 657 ml  Output --  Net 657 ml     Pressure Injury 10/31/22 Buttocks Left Unstageable - Full thickness tissue loss in which the base of the injury is covered by slough (yellow, tan, gray, green or brown) and/or eschar (tan, brown or black) in the wound bed. (Active)  10/31/22 2000  Location: Buttocks  Location Orientation: Left  Staging: Unstageable - Full thickness tissue loss in which the base of the injury is covered by slough (yellow, tan, gray, green or brown) and/or eschar (tan, brown or black) in the wound bed.  Wound Description (Comments):   Present on Admission: Yes    Physical Exam: Vital Signs Blood pressure 113/83, pulse 87, temperature 98.1 F (36.7 C), resp. rate 16, height 5\' 1"  (1.549 m), SpO2 100 %.   BMI 19.29, on tilt table with therapy General: No acute distress Mood and affect are appropriate Heart: Regular rate and rhythm no rubs murmurs or extra sounds Lungs: Clear to auscultation, breathing unlabored, no rales or wheezes Abdomen: Positive bowel sounds, soft nontender to palpation, nondistended Extremities: No clubbing, cyanosis, or edema Skin: No evidence of breakdown, no evidence of rash Neurologic: pt with minimal responsiveness, which impairs ability to perform MMT motor strength is 4/5 in bilateral  grip but poor relaxation  increased  tone and no movement to command when testing proximal UE  0/5 Bilateral , hip flexor, knee extensors, ankle dorsiflexor and plantar flexor Sensory exam unable to assess other than grimace to pinch of bilateral fingers and toes  Increased elbow flexor tone MAS3 , MAS 3 at pecs , LE tone reduced MAS 0  Cerebellar exam unable to assess  Musculoskeletal: reduced bilateral shoulder and elbow ROM due to increased tone    Assessment/Plan: 1. Functional deficits which require 3+ hours per day of interdisciplinary therapy in a comprehensive inpatient rehab setting. Physiatrist is providing close team supervision and 24 hour management of active medical problems listed below. Physiatrist and rehab team continue to assess barriers to discharge/monitor patient progress toward functional and medical goals  Care Tool:  Bathing        Body parts bathed by helper: Right arm, Left arm, Chest, Abdomen, Front perineal area, Buttocks, Right upper leg, Left upper leg, Right lower leg, Left lower leg, Face     Bathing assist Assist Level: Dependent - Patient 0%     Upper Body Dressing/Undressing Upper body dressing   What is the patient wearing?: Pull over shirt    Upper body assist Assist Level: Dependent - Patient 0%    Lower Body Dressing/Undressing Lower body dressing      What  is the patient wearing?: Pants     Lower body assist Assist for lower body dressing: Dependent - Patient 0%     Toileting Toileting Toileting Activity did not occur (Clothing management and hygiene only): N/A (no void or bm)  Toileting assist Assist for toileting: 2 Helpers     Transfers Chair/bed transfer  Transfers assist  Chair/bed transfer activity did not occur: Safety/medical concerns  Chair/bed transfer assist level: 2 Helpers     Locomotion Ambulation   Ambulation assist   Ambulation activity did not occur: Safety/medical concerns (attempted with 3 musketteers and +2 for WC follow but not  safe)          Walk 10 feet activity   Assist  Walk 10 feet activity did not occur: Safety/medical concerns        Walk 50 feet activity   Assist Walk 50 feet with 2 turns activity did not occur: Safety/medical concerns         Walk 150 feet activity   Assist Walk 150 feet activity did not occur: Safety/medical concerns         Walk 10 feet on uneven surface  activity   Assist Walk 10 feet on uneven surfaces activity did not occur: Safety/medical concerns         Wheelchair     Assist Is the patient using a wheelchair?: Yes Type of Wheelchair: Manual    Wheelchair assist level: Dependent - Patient 0%      Wheelchair 50 feet with 2 turns activity    Assist    Wheelchair 50 feet with 2 turns activity did not occur: Safety/medical concerns       Wheelchair 150 feet activity     Assist  Wheelchair 150 feet activity did not occur: Safety/medical concerns       Blood pressure 113/83, pulse 87, temperature 98.1 F (36.7 C), resp. rate 16, height 5\' 1"  (1.549 m), SpO2 100 %.  Medical Problem List and Plan: 1. 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 Extubated 11/05/2022 Fluctuating level of alertness             -patient may  shower             -ELOS/Goals: min/modA but prognosis is guarded due to severe cognitive deficits goals 28-30d  Therapy notes reviewed  Tasia Catchings bed ordered  2.  Antithrombotics: -DVT/anticoagulation:  Pharmaceutical: Lovenox             -antiplatelet therapy: N/A 3. Pain Management: Tylenol as needed 4. Mood/Behavior/Sleep: Provide emotional support             -antipsychotic agents: N/A 5. Neuropsych/cognition: This patient is not capable of making decisions on her own behalf. 6. Left buttock unstageable ulcer.  Continue Medihoney to buttock wound daily cover with foam dressing.  Change dressing every 3 days or as needed soiling  7.  Fluids/Electrolytes/Nutrition: Routine in and outs with follow-up chemistries  8.  Cavitary right lower lobe pneumonia likely aspiration pneumonia/MRSA pneumonia.  Continue course of Zyvox as indicated per infectious disease  9.  History of drug abuse.  Positive cocaine on urine drug screen.  Provide counseling  10.  AKI/hypovolemia and ATN.  Renal function much improved.  Repeat Cr today.    Latest Ref Rng & Units 11/15/2022    4:23 AM 11/14/2022    4:09 AM 11/13/2022   12:26 PM  BMP  Glucose 70 - 99 mg/dL 93  99  98  BUN 6 - 20 mg/dL 17  15  12    Creatinine 0.44 - 1.00 mg/dL 4.09  8.11  9.14   Sodium 135 - 145 mmol/L 136  134  138   Potassium 3.5 - 5.1 mmol/L 3.9  4.1  2.9   Chloride 98 - 111 mmol/L 102  102  114   CO2 22 - 32 mmol/L 24  23  20    Calcium 8.9 - 10.3 mg/dL 8.5  8.3  6.6     11.  Mild transaminitis with rhabdomyolysis.  CK reviewed and normalized  12.  Hypotension.  Improving, decrease ProAmatine to 5mg  BID with meals.   13. Impaired initiation: increase amantadine to 200mg  BID  14. Suboptimal vitamin D: start 1,000U D3 daily  15. Spasticity: start baclofen 5mg  TID  16. Bowel and bladder incontinence: bowel and bladder program    LOS: 4 days A FACE TO FACE EVALUATION WAS PERFORMED  Clint Bolder P Yaneisy Wenz 11/22/2022, 10:23 AM

## 2022-11-22 NOTE — Progress Notes (Signed)
Occupational Therapy Session Note  Patient Details  Name: Nicole [name] MRN: [id number] Date of Birth: 1975/10/30  Today's Date: 11/22/2022 OT Individual Time: 4098-1191 OT Individual Time Calculation (min): 45 min    Short Term Goals: Week 1:  OT Short Term Goal 1 (Week 1): Pt will initiate simple ADL task with Max multimodal cuing. OT Short Term Goal 2 (Week 1): Pt will attend to ADL task for >2 min in distraction-free environment. OT Short Term Goal 3 (Week 1): Pt will express preference between two options in preparation for self-advocacy during medical care.  Skilled Therapeutic Interventions/Progress Updates:    Patient received supine in bed, assisted to sitting at edge of bed with total assist.  Second caregiver present to assist with sitting balance / alignment and postural control.  Patient on one occasion able to maintain a static seated position when stacked over base of support x 3 seconds before forward loss of balance.  Patient may make moaning sound when loses balance into full flexed position.  Worked to use hands as part of base of support, also to increase elbow extension bilaterally.  Worked on sit to stand with max/total assist.  Patient accepting weight on RLE this session, so able to complete stand pivot transfer.  In wheelchair assisted patient into clothing.  Patient offered choice from field of two shirts - not discernable response.  Patient moving both thumbs in response to passive stretch.  However - not using as consistent gesture -= thumbs up.  Patient did make eye contact and hold contact x 2-4 seconds.  Smiling when greeted, laughing when laughing modeled or when being teased.  Mom present part way through this session and asking again about getting hair washed - patient very distracted by itchy scalp.  Discussed with mom that we could tip her head toward sink, but not yet get in shower with OT - need improved sitting balance.  Mom expressed understanding, and plans to  bring in medicated shampoo tomorrow.    Provided gentle passive range to shoulders and elbows while seated.  Patient lacking ~ 10 degrees extension right and ~30 degrees of extension left. Fabricated neck roll to reduce lateral tipping of head, and reduced full recline of chair to promote some active head control opportunity.  Mom at bedside.  Patient left up[ in wheelchair with tray table in place, and call bell/ personal items in reach.    Therapy Documentation Precautions:  Precautions Precautions: Fall Precaution Comments: Contact precautions & delayed processing Restrictions Weight Bearing Restrictions: No  Pain:  Grimaces with Passive range of motion to left elbow   Therapy/Group: Individual Therapy  [name] 11/22/2022, 2:05 PM

## 2022-11-22 NOTE — [place] FL2 (Signed)
MEDICAID FL2 LEVEL OF CARE FORM     IDENTIFICATION  Patient Name: LOUNETTE [name] Birthdate: [birth year] Sex: female Admission Date (Current Location): 11/18/2022  [place] and [place] Number:  Haynes Bast [id number] S Facility and Address:  [facility], [address] [place], [place] [address]      Provider Number: [id number]  Attending Physician Name and Address:  Horton Chin, MD  Relative Name and Phone Number:  Maude Leriche [phone]    Current Level of Care: Other (Comment) (Rehab) Recommended Level of Care: Skilled Nursing Facility Prior Approval Number:    Date Approved/Denied:   PASRR Number:    Discharge Plan: SNF    Current Diagnoses: Patient Active Problem List   Diagnosis Date Noted   Hypoxic brain injury (HCC) 11/18/2022   Cavitary pneumonia 11/05/2022   Substance use 11/05/2022   Acute respiratory failure (HCC) 10/31/2022   Nonallergic rhinitis 08/23/2020   Mild persistent asthma without complication 06/14/2020   Other allergic rhinitis 06/14/2020   Adverse reaction to food, subsequent encounter 06/14/2020   IBS (irritable bowel syndrome) 05/24/2016   Chronic venous insufficiency 05/24/2016   Migraine 05/24/2016   Pulmonary nodules 10/27/2014   Hepatitis C, chronic (HCC)    Depression 10/09/2013   OSA on CPAP - sees Dr. Jenne Campus in ENT 10/09/2013   Allergic rhinitis 05/30/2012   Cough variant asthma 02/08/2012    Orientation RESPIRATION BLADDER Height & Weight        Normal Incontinent Weight:   Height:  5\' 1"  (154.9 cm)  BEHAVIORAL SYMPTOMS/MOOD NEUROLOGICAL BOWEL NUTRITION STATUS      Incontinent Diet (Dys 3 thin liquids)  AMBULATORY STATUS COMMUNICATION OF NEEDS Skin   Total Care Does not communicate Normal                       Personal Care Assistance Level of Assistance  Bathing, Feeding, Dressing, Total care Bathing Assistance: Maximum assistance Feeding assistance: Maximum  assistance Dressing Assistance: Maximum assistance Total Care Assistance: Maximum assistance   Functional Limitations Info  Speech, Sight Sight Info: Impaired   Speech Info: Impaired    SPECIAL CARE FACTORS FREQUENCY  PT (By licensed PT), OT (By licensed OT), Bowel and bladder program, Speech therapy     PT Frequency: 5x week OT Frequency: 5x week Bowel and Bladder Program Frequency: timed toileting 3-4 hours   Speech Therapy Frequency: 5x week      Contractures Contractures Info: Not present    Additional Factors Info  Code Status, Allergies, Isolation Precautions Code Status Info: FULL CODE Allergies Info: [place] NUTS, CAHSEWS NUTS, CECLOR, NICKEL TRIPTANS     Isolation Precautions Info: MRSA     Current Medications (11/22/2022):  This is the current hospital active medication list Current Facility-Administered Medications  Medication Dose Route Frequency Provider Last Rate Last Admin   acetaminophen (TYLENOL) tablet 650 mg  650 mg Oral Q6H PRN Charlton Amor, PA-C   650 mg at 11/21/22 2015   amantadine (SYMMETREL) capsule 100 mg  100 mg Oral Daily Raulkar, Drema Pry, MD   100 mg at 11/22/22 0819   baclofen (LIORESAL) tablet 5 mg  5 mg Oral TID Horton Chin, MD       enoxaparin (LOVENOX) injection 40 mg  40 mg Subcutaneous Q24H AngiulliMcarthur Rossetti, PA-C   40 mg at 11/21/22 1804   feeding supplement (ENSURE ENLIVE / ENSURE PLUS) liquid 237 mL  237 mL Oral BID BM Angiulli,  Mcarthur Rossetti, PA-C   237 mL at 11/22/22 0819   leptospermum manuka honey (MEDIHONEY) paste 1 Application  1 Application Topical Daily Charlton Amor, PA-C   1 Application at 11/22/22 1610   linezolid (ZYVOX) tablet 600 mg  600 mg Oral Q12H Charlton Amor, PA-C   600 mg at 11/22/22 9604   midodrine (PROAMATINE) tablet 5 mg  5 mg Oral BID WC Raulkar, Drema Pry, MD   5 mg at 11/22/22 5409   multivitamin with minerals tablet 1 tablet  1 tablet Oral Daily Charlton Amor, PA-C   1 tablet at  11/22/22 8119   polyethylene glycol (MIRALAX / GLYCOLAX) packet 17 g  17 g Oral Daily PRN Charlton Amor, PA-C         Discharge Medications: Please see discharge summary for a list of discharge medications.  Relevant Imaging Results:  Relevant Lab Results:   Additional Information SSN: [id number]  Mercy Leppla, Lemar Livings, LCSW

## 2022-11-23 DIAGNOSIS — Z7189 Other specified counseling: Secondary | ICD-10-CM

## 2022-11-23 DIAGNOSIS — G931 Anoxic brain damage, not elsewhere classified: Principal | ICD-10-CM

## 2022-11-23 DIAGNOSIS — F1910 Other psychoactive substance abuse, uncomplicated: Secondary | ICD-10-CM

## 2022-11-23 DIAGNOSIS — G8250 Quadriplegia, unspecified: Principal | ICD-10-CM

## 2022-11-23 MED ORDER — BACLOFEN 10 MG PO TABS
10.0000 mg | ORAL_TABLET | Freq: Three times a day (TID) | ORAL | Status: DC
  Administered 2022-11-23 – 2022-12-02 (×27): 10 mg via ORAL
  Filled 2022-11-23 (×27): qty 1

## 2022-11-23 NOTE — Progress Notes (Signed)
Occupational Therapy Session Note  Patient Details  Name: Nicole [name] MRN: [id number] Date of Birth: 07-27-1975  Today's Date: 11/23/2022 OT Individual Time: 1100-1130 and 4098-1191 OT Individual Time Calculation (min): 30 min  and 46 min   Short Term Goals: Week 1:  OT Short Term Goal 1 (Week 1): Pt will initiate simple ADL task with Max multimodal cuing. OT Short Term Goal 2 (Week 1): Pt will attend to ADL task for >2 min in distraction-free environment. OT Short Term Goal 3 (Week 1): Pt will express preference between two options in preparation for self-advocacy during medical care.  Skilled Therapeutic Interventions/Progress Updates:    AM session:  Patient received up in wheelchair.  Patient alerted to her name, made eye contact briefly and smiled.  Patient able to briefly participate in familiar functional tasks once started with hand over hand assist - washing face and brushing hair.  Assisted patient to don shirt and pants.  Transitioning to stand to pull pants over hips.  Patient continues to show improved ability to accept weight on feet - hip extension improving - sit to stand - total assist.  Patient left up in wheelchair with tray table in place and pillow to aide with positioning of Ue's.  Safety belt in place and engaged.    PM Session:  Patient received supine in bed scratching at her head with right hand.  When asked if she wanted to take a warm shower, patient nodded head yes.  Assisted to rolling shower seat with total assist and second caregiver available.  [name] brought medicated shampoo.  Patient used right hand to aide with washing hair and face again after hand over hand cueing to initiate.  Patient incontinent of stool - large, formed - messaged nursing.  Patient able to safely maintain semi reclined seated position for duration of shower.  Assisted patient to dry herself.  Patient sat in front of mirror but did not hold head sufficiently upright to see self.  Brush  placed in right hand, and patient made brush strokes to right side of head - not yet effective, but moments of participation.   Patient returned to bed.  Spoke with nursing regarding scab on top of head.  Mom at bedside - bed alarm engaged.    Therapy Documentation Precautions:  Precautions Precautions: Fall Precaution Comments: Contact precautions & delayed processing Restrictions Weight Bearing Restrictions: No  Pain:  Unable to communicate.   Grimaces at times with passive range to left arm.           Therapy/Group: Individual Therapy  [name] 11/23/2022, 2:56 PM

## 2022-11-23 NOTE — Consult Note (Signed)
Consultation Note Date: 11/23/2022   Patient Name: Nicole [name]  DOB: 12/25/1975  MRN: [id number]  Age / Sex: 47 y.o., female  PCP: Georganna Skeans, MD Referring Physician: Horton Chin, MD  Reason for Consultation:  discuss goals of care with family  HPI/Patient Profile: 47 y.o. female  with past medical history of asthma, anxiety/depression, drug abuse. Admitted on 11/18/2022 after being found down, unresponsive for unknown time. Required intubation. CT and MRI indicated concerns for anoxic vs metabolic brain injury. UDS positive for cocaine. She had lower lobe consolidation and concerns for cavitary pneumonia. Extubated on 6/23. Continues to have cognitive and physical deficits. Minimally verbal. Requires feeding. Currently admitted to CIR. Palliative medicine consulted for GOC.    Primary Decision Maker NEXT OF KIN Mother - Vickki Muff  Discussion: Chart reviewed including labs, progress notes, imaging from this and previous encounters.  Patient is currently wheelchair bound and total assist for all ADL's. On evaluation she was awake and alert, did not respond verbally or physically to me. PT present- patient did not initiate any movement on her own. On later eval, pt sitting up in bed, tracking, being fed by staff. Met with [name] outside of room.  Nicole [name] has had difficult life. She graduated from [employer] and had a career in Primary school teacher. She has struggled with drug abuse intermittently. She is divorced. Her mother is her only family.  We discussed Nicole [name]'s current illness state and risk for future complications. GOC were discussed.   I completed a MOST form today with Sharlee's mom. The patient and family outlined their wishes for the following treatment decisions:  Cardiopulmonary Resuscitation: Do Not Attempt Resuscitation (DNR/No CPR)  Medical Interventions: Limited Additional Interventions: Use  medical treatment, IV fluids and cardiac monitoring as indicated, DO NOT USE intubation or mechanical ventilation. May consider use of less invasive airway support such as BiPAP or CPAP. Also provide comfort measures. Transfer to the hospital if indicated. Avoid intensive care.   Antibiotics: Antibiotics if indicated  IV Fluids: IV fluids for a defined trial period  Feeding Tube: No feeding tube        SUMMARY OF RECOMMENDATIONS -Continue current plan of care -DNR- limited medical interventions -Alaia's Mom works at [employer] - she expects pt to be dc'd to SNF    Code Status/Advance Care Planning: DNR   Prognosis:   Unable to determine  Discharge Planning:  SNF-rehab  Primary Diagnoses: Present on Admission:  Hypoxic brain injury (HCC)   Review of Systems  Unable to perform ROS: Mental status change    Physical Exam Vitals and nursing note reviewed.  Constitutional:      Comments: thin  Musculoskeletal:     Comments: Contractures present, no independent movement of limbs  Skin:    Coloration: Skin is pale.  Neurological:     Mental Status: She is alert.     Comments: Nonverbal     Vital Signs: BP 114/83 (BP Location: Left Wrist)   Pulse 91   Temp 97.7 F (36.5  C)   Resp 16   Ht 5\' 1"  (1.549 m)   SpO2 98%   BMI 19.29 kg/m  Pain Scale: Faces   Pain Score: 0-No pain   SpO2: SpO2: 98 % O2 Device:SpO2: 98 % O2 Flow Rate: .   IO: Intake/output summary:  Intake/Output Summary (Last 24 hours) at 11/23/2022 1240 Last data filed at 11/23/2022 0981 Gross per 24 hour  Intake 1260 ml  Output --  Net 1260 ml    LBM: Last BM Date : 11/22/22 Baseline Weight:   Most recent weight:         Thank you for this consult. Palliative medicine will continue to follow and assist as needed.  Time Total: 90 mins Greater than 50%  of this time was spent counseling and coordinating care related to the above assessment and plan.  Signed by: Ocie Bob,  AGNP-C Palliative Medicine    Please contact Palliative Medicine Team phone at [phone] for questions and concerns.  For individual provider: See Loretha Stapler

## 2022-11-23 NOTE — Progress Notes (Signed)
Physical Therapy Session Note  Patient Details  Name: Nicole [name] MRN: [id number] Date of Birth: Jan 05, 1976  Today's Date: 11/23/2022 PT Individual Time: 1132-1200 PT Individual Time Calculation (min): 28 min   Short Term Goals: Week 1:  PT Short Term Goal 1 (Week 1): pt will perform sit<>stand with +2 max A PT Short Term Goal 2 (Week 1): pt will initiate gait training PT Short Term Goal 3 (Week 1): Pt will perform bed<>chair transfer with +2 max A  Skilled Therapeutic Interventions/Progress Updates: Pt presents sitting in TIS reclined and unable to verbalize pain or participation.  Pt grimacing w/ stretching of C/spine into extension.  Pt transferred w/c > bed w/ squat pivot and total A using towel under bottom/legs for transfer using aerobic step for height back to Kreg bed.  Pt requires constant A to maintain upright posture w/ stretching of c/spine into extension.  Pt transferred sit to supine w/ total A.  Bed left in flat position for passive stretch of neck to extension, gradually remove pillow for increased.  Pt left in full supine for stretches of B elbows, R > L.  Pt c-spine stretched into SB L.  Pt w/ grimaces w/ stretch.  Pt remained supine in bed w/ 1 pillow under head, appears tolerated.  All rails up.     Therapy Documentation Precautions:  Precautions Precautions: Fall Precaution Comments: Contact precautions & delayed processing Restrictions Weight Bearing Restrictions: No General:   Vital Signs:   Pain:unable to verbalize, grimaces w/ stretching of c/spine. Pain Assessment Pain Scale: Faces Pain Score: 0-No pain    Therapy/Group: Individual Therapy  [name] 11/23/2022, 12:04 PM

## 2022-11-23 NOTE — Progress Notes (Signed)
PROGRESS NOTE   Subjective/Complaints: In gym on tilt table working with PT Developing contractures, start baclofen 5mg  TID, BP reviewed and is soft and amantadine was also started today  ROS- +contracture development as per PT  Objective:   No results found. No results for input(s): "WBC", "HGB", "HCT", "PLT" in the last 72 hours. No results for input(s): "NA", "K", "CL", "CO2", "GLUCOSE", "BUN", "CREATININE", "CALCIUM" in the last 72 hours.  Intake/Output Summary (Last 24 hours) at 11/23/2022 1541 Last data filed at 11/23/2022 4098 Gross per 24 hour  Intake 960 ml  Output --  Net 960 ml     Pressure Injury 10/31/22 Buttocks Left Unstageable - Full thickness tissue loss in which the base of the injury is covered by slough (yellow, tan, gray, green or brown) and/or eschar (tan, brown or black) in the wound bed. (Active)  10/31/22 2000  Location: Buttocks  Location Orientation: Left  Staging: Unstageable - Full thickness tissue loss in which the base of the injury is covered by slough (yellow, tan, gray, green or brown) and/or eschar (tan, brown or black) in the wound bed.  Wound Description (Comments):   Present on Admission: Yes     Pressure Injury 11/18/22 Toe (Comment  which one) Anterior;Right Deep Tissue Pressure Injury - Purple or maroon localized area of discolored intact skin or blood-filled blister due to damage of underlying soft tissue from pressure and/or shear. small are (Active)  11/18/22 1300  Location: Toe (Comment  which one)  Location Orientation: Anterior;Right  Staging: Deep Tissue Pressure Injury - Purple or maroon localized area of discolored intact skin or blood-filled blister due to damage of underlying soft tissue from pressure and/or shear.  Wound Description (Comments): small area on right great toe metatarsal  Present on Admission: Yes     Pressure Injury 11/18/22 Toe (Comment  which one)  Anterior;Right Stage 1 -  Intact skin with non-blanchable redness of a localized area usually over a bony prominence. small pink area of non blanchable skin on Right pinky toe (Active)  11/18/22 1900  Location: Toe (Comment  which one)  Location Orientation: Anterior;Right  Staging: Stage 1 -  Intact skin with non-blanchable redness of a localized area usually over a bony prominence.  Wound Description (Comments): small pink area of non blanchable skin on Right pinky toe  Present on Admission: Yes     Pressure Injury 11/22/22 Toe (Comment  which one) Anterior;Right Stage 2 -  Partial thickness loss of dermis presenting as a shallow open injury with a red, pink wound bed without slough. small scratch on dorsal aspect of foot with dark purple brusing ar (Active)  11/22/22 1930  Location: Toe (Comment  which one)  Location Orientation: Anterior;Right  Staging: Stage 2 -  Partial thickness loss of dermis presenting as a shallow open injury with a red, pink wound bed without slough.  Wound Description (Comments): small scratch on dorsal aspect of foot with dark purple brusing around the base of the foot possible medical device related to Ted hose  Present on Admission: No    Physical Exam: Vital Signs Blood pressure 101/74, pulse 85, temperature 98.4 F (36.9 C), resp. rate 18,  height 5\' 1"  (1.549 m), SpO2 100%.   BMI 19.29, on tilt table with therapy General: No acute distress Mood and affect are appropriate Heart: Regular rate and rhythm no rubs murmurs or extra sounds Lungs: Clear to auscultation, breathing unlabored, no rales or wheezes Abdomen: Positive bowel sounds, soft nontender to palpation, nondistended Extremities: No clubbing, cyanosis, or edema Skin: No evidence of breakdown, no evidence of rash Neurologic: pt with minimal responsiveness, which impairs ability to perform MMT motor strength is 4/5 in bilateral  grip but poor relaxation  increased tone and no movement to command  when testing proximal UE  0/5 Bilateral , hip flexor, knee extensors, ankle dorsiflexor and plantar flexor Sensory exam unable to assess other than grimace to pinch of bilateral fingers and toes  Increased elbow flexor tone MAS3 , MAS 3 at pecs , LE tone reduced MAS 0  Cerebellar exam unable to assess  Musculoskeletal: reduced bilateral shoulder and elbow ROM due to increased tone    Assessment/Plan: 1. Functional deficits which require 3+ hours per day of interdisciplinary therapy in a comprehensive inpatient rehab setting. Physiatrist is providing close team supervision and 24 hour management of active medical problems listed below. Physiatrist and rehab team continue to assess barriers to discharge/monitor patient progress toward functional and medical goals  Care Tool:  Bathing        Body parts bathed by helper: Right arm, Left arm, Chest, Abdomen, Front perineal area, Buttocks, Right upper leg, Left upper leg, Right lower leg, Left lower leg, Face     Bathing assist Assist Level: Dependent - Patient 0%     Upper Body Dressing/Undressing Upper body dressing   What is the patient wearing?: Pull over shirt    Upper body assist Assist Level: Dependent - Patient 0%    Lower Body Dressing/Undressing Lower body dressing      What is the patient wearing?: Pants     Lower body assist Assist for lower body dressing: Dependent - Patient 0%     Toileting Toileting Toileting Activity did not occur (Clothing management and hygiene only): N/A (no void or bm)  Toileting assist Assist for toileting: 2 Helpers     Transfers Chair/bed transfer  Transfers assist  Chair/bed transfer activity did not occur: Safety/medical concerns  Chair/bed transfer assist level: Total Assistance - Patient < 25%     Locomotion Ambulation   Ambulation assist   Ambulation activity did not occur: Safety/medical concerns (attempted with 3 musketteers and +2 for WC follow but not safe)           Walk 10 feet activity   Assist  Walk 10 feet activity did not occur: Safety/medical concerns        Walk 50 feet activity   Assist Walk 50 feet with 2 turns activity did not occur: Safety/medical concerns         Walk 150 feet activity   Assist Walk 150 feet activity did not occur: Safety/medical concerns         Walk 10 feet on uneven surface  activity   Assist Walk 10 feet on uneven surfaces activity did not occur: Safety/medical concerns         Wheelchair     Assist Is the patient using a wheelchair?: Yes Type of Wheelchair: Manual    Wheelchair assist level: Dependent - Patient 0%      Wheelchair 50 feet with 2 turns activity    Assist    Wheelchair 50 feet with 2  turns activity did not occur: Safety/medical concerns       Wheelchair 150 feet activity     Assist  Wheelchair 150 feet activity did not occur: Safety/medical concerns       Blood pressure 101/74, pulse 85, temperature 98.4 F (36.9 C), resp. rate 18, height 5\' 1"  (1.549 m), SpO2 100%.  Medical Problem List and Plan: 1. 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 Extubated 11/05/2022 Fluctuating level of alertness             -patient may  shower             -ELOS/Goals: min/modA but prognosis is guarded due to severe cognitive deficits goals 28-30d  Therapy notes reviewed  Tasia Catchings bed ordered  Mother updated  Palliative care consulted  2.  Antithrombotics: -DVT/anticoagulation:  Pharmaceutical: Lovenox             -antiplatelet therapy: N/A 3. Pain Management: Tylenol as needed 4. Mood/Behavior/Sleep: Provide emotional support             -antipsychotic agents: N/A 5. Neuropsych/cognition: This patient is not capable of making decisions on her own behalf. 6. Left buttock unstageable ulcer.  Continue Medihoney to buttock wound daily cover with foam dressing.  Change dressing every 3 days or as  needed soiling  7. Fluids/Electrolytes/Nutrition: Routine in and outs with follow-up chemistries  8.  Cavitary right lower lobe pneumonia likely aspiration pneumonia/MRSA pneumonia.  Continue course of Zyvox as indicated per infectious disease  9.  History of drug abuse.  Positive cocaine on urine drug screen.  Provide counseling  10.  AKI/hypovolemia and ATN.  Renal function much improved.  Repeat Cr today.    Latest Ref Rng & Units 11/15/2022    4:23 AM 11/14/2022    4:09 AM 11/13/2022   12:26 PM  BMP  Glucose 70 - 99 mg/dL 93  99  98   BUN 6 - 20 mg/dL 17  15  12    Creatinine 0.44 - 1.00 mg/dL 1.61  0.96  0.45   Sodium 135 - 145 mmol/L 136  134  138   Potassium 3.5 - 5.1 mmol/L 3.9  4.1  2.9   Chloride 98 - 111 mmol/L 102  102  114   CO2 22 - 32 mmol/L 24  23  20    Calcium 8.9 - 10.3 mg/dL 8.5  8.3  6.6     11.  Mild transaminitis with rhabdomyolysis.  CK reviewed and normalized  12.  Hypotension.  Improving, decrease ProAmatine to 5mg  BID with meals.   13. Impaired initiation: continue amantadine to 200mg  BID  14. Suboptimal vitamin D: continue 1,000U D3 daily  15. Spasticity: increase baclofen to 10mg  TID  16. Bowel and bladder incontinence: bowel and bladder program    LOS: 5 days A FACE TO FACE EVALUATION WAS PERFORMED  Drema Pry Lacrecia Delval 11/23/2022, 3:41 PM

## 2022-11-23 NOTE — Progress Notes (Signed)
Physical Therapy Session Note  Patient Details  Name: Nicole [name] MRN: [id number] Date of Birth: December 07, 1975  Today's Date: 11/23/2022 PT Individual Time: 0802-0858 PT Individual Time Calculation (min): 56 min   Short Term Goals: Week 1:  PT Short Term Goal 1 (Week 1): pt will perform sit<>stand with +2 max A PT Short Term Goal 2 (Week 1): pt will initiate gait training PT Short Term Goal 3 (Week 1): Pt will perform bed<>chair transfer with +2 max A  Skilled Therapeutic Interventions/Progress Updates:       Pt in bed being fed breakfast by NT. KREG bed to be delivered today. NT assisting in pericare and brief change due to soiled brief, incontinent of bladder. Pt vocalizing some today! Unable to understand and only for brief period of time at beginning of session.  Donned thigh-high compression socks with dependent assist. Bed mobility completed dependently with +1 assist and squat pivot transfer dependent assist into TIS w/c, pt folding into trunk flexion for over-the-back technique to transfer her.   Transported into day room rehab gym and setup tilt table. Completed +3 dependent lift transfer onto tilt table - used sheet to help with repositioning on tilt table to prevent shearing and help preserve skin integrity. Pt tolerated ~10-12 minutes of modified standing in tilt table, up to 50deg. PT positioning BLE to encourage weight bearing and stretching her heel cords. Also used mirror in front of her for visual feedback and stimulation. Continued cervical stretching into extension and neutral position, as well as B elbow extension as tolerated.   Returned to [facility] w/c with +3 dependent lift and then transported back to her room. Reclined in TIS w/c with full lap tray and pillows positioned to support her. All needs met.    Therapy Documentation Precautions:  Precautions Precautions: Fall Precaution Comments: Contact precautions & delayed processing Restrictions Weight Bearing  Restrictions: No General:     Therapy/Group: Individual Therapy  Press Casale P Cohl Behrens  PT, DPT, CSRS  11/23/2022, 7:50 AM

## 2022-11-23 NOTE — Progress Notes (Signed)
Speech Language Pathology Daily Session Note  Patient Details  Name: Nicole [name] MRN: [id number] Date of Birth: 1975-06-24  Today's Date: 11/23/2022 SLP Individual Time: 1300-1400 SLP Individual Time Calculation (min): 60 min  Short Term Goals: Week 1: SLP Short Term Goal 1 (Week 1): Patient will utilize multimodal communication to answer basic yes/no questions with Max A multimodal cues in 25% of opportunities SLP Short Term Goal 2 (Week 1): Patient will follow 1-step commands within a context in 25% of opportunities with Max A multimodal cues. SLP Short Term Goal 3 (Week 1): Patient will vocalize on command in 25% of opportunities with Max A multimodal cues. SLP Short Term Goal 4 (Week 1): Pt will present w/ appropriate response to functional stimuli (i.e. accept spoon, grasp basic item appropriately, response to tactile stimuli)  Skilled Therapeutic Interventions:   Pt greeted in bed, as RNT was assisting pt w/ noon meal. RNT reported that the pt ate her noon meal without any difficulties despite positioning. SLP then assisted RNT w/ pt care and brief change 2* incontinent episode. After, SLP facilitated a variety of functional tasks to target cognition and functional language. [name] head remained tilted down for entirety of tx session. Also required totalA from SLP to remain at midline 2* R tilt. Pt initially unresponsive to any stimuli from SLP until SLP provided pt with a mirror (positioned in her lap given head tilt down) and she was able to look at herself w/ max-DEP cues. Sustained gaze noted for ~10 seconds but pt was unable to complete again. She then benefited from hand over hand assist to trace a circle and square with a marker. Appropriate grip of the marker noted x1, otherwise totalA was required from SLP. Pt noted to look up with her eyes in response to country music played by SLP and nodded "yes" when asked if she liked the song. Otherwise, no purposeful communication noted. Pt left in  bed with bed alarm set. [name] nurse present in room upon SLP departure. Recommend cont ST per POC.   Pain  Unable to report pain. Noted to grimace with movement.   Therapy/Group: Individual Therapy  [name] 11/23/2022, 3:25 PM

## 2022-11-24 ENCOUNTER — Inpatient Hospital Stay (HOSPITAL_COMMUNITY): Payer: MEDICAID

## 2022-11-24 LAB — CBC
HCT: 38.5 % (ref 36.0–46.0)
Hemoglobin: 12.9 g/dL (ref 12.0–15.0)
MCH: 32.5 pg (ref 26.0–34.0)
MCHC: 33.5 g/dL (ref 30.0–36.0)
MCV: 97 fL (ref 80.0–100.0)
Platelets: 249 10*3/uL (ref 150–400)
RBC: 3.97 MIL/uL (ref 3.87–5.11)
RDW: 12.6 % (ref 11.5–15.5)
WBC: 5.5 10*3/uL (ref 4.0–10.5)
nRBC: 0 % (ref 0.0–0.2)

## 2022-11-24 LAB — CREATININE, SERUM
Creatinine, Ser: 0.79 mg/dL (ref 0.44–1.00)
GFR, Estimated: >60 mL/min (ref >60)

## 2022-11-24 MED ORDER — MIDODRINE HCL 5 MG PO TABS
5.0000 mg | ORAL_TABLET | Freq: Every day | ORAL | Status: DC
  Administered 2022-11-25 – 2022-11-29 (×5): 5 mg via ORAL
  Filled 2022-11-24 (×5): qty 1

## 2022-11-24 MED ORDER — GADOBUTROL 1 MMOL/ML IV SOLN: 5.0000 mL | Freq: Once | INTRAVENOUS | Status: DC | PRN

## 2022-11-24 NOTE — Progress Notes (Signed)
Physical Therapy Session Note  Patient Details  Name: Nicole [name] MRN: [id number] Date of Birth: [birth year]  Today's Date: 11/24/2022 PT Individual Time: 1100-1200 PT Individual Time Calculation (min): 60 min   Short Term Goals: Week 1:  PT Short Term Goal 1 (Week 1): pt will perform sit<>stand with +2 max A PT Short Term Goal 2 (Week 1): pt will initiate gait training PT Short Term Goal 3 (Week 1): Pt will perform bed<>chair transfer with +2 max A  Skilled Therapeutic Interventions/Progress Updates:      Direct handoff of care from OT with patient in TIS w/c. Pt with no signs of pain. Pt now figidty and itching her scalp - provided her with figidty popper which she showed some interest in, but would continue to return to itching her scalp. Applied the mitten restraint at end of session.   Transported patient to day room rehab gym. Setup Tilt Table and completed x3 dependent lift onto Tilt Table, using sheet to help reposition as needed to avoid shearing on buttock. B heels unable to make contact with foot board due to tone while lying supine.   Worked on modified standing usiing tilt feature, progressing in ~20deg increments - tolerating ~50-60 degrees well with adequate weight bearing through B feet and able to eventually reach feet flat with progressive weight bearing. Standing in these positions for >10 minutes. While standing worked on cervical extension, visual scanning. In supine, worked on PROM for cervical rotation, lateral flexion.   Returned to her room and concluded session reclined in TIS w/c. Full lap tray supporting BUE. Pillows and towel rolls provided for positioning. All needs met.   Therapy Documentation Precautions:  Precautions Precautions: Fall Precaution Comments: Contact precautions & delayed processing Restrictions Weight Bearing Restrictions: No General:      Therapy/Group: Individual Therapy  [name]  PT, DPT, CSRS  11/24/2022, 7:51 AM

## 2022-11-24 NOTE — Progress Notes (Addendum)
Speech Language Pathology Daily Session Note  Patient Details  Name: Nicole [name] MRN: [id number] Date of Birth: April 20, 1976  Today's Date: 11/24/2022 SLP Individual Time: 1300-1400 SLP Individual Time Calculation (min): 60 min  Short Term Goals: Week 1: SLP Short Term Goal 1 (Week 1): Patient will utilize multimodal communication to answer basic yes/no questions with Max A multimodal cues in 25% of opportunities SLP Short Term Goal 2 (Week 1): Patient will follow 1-step commands within a context in 25% of opportunities with Max A multimodal cues. SLP Short Term Goal 3 (Week 1): Patient will vocalize on command in 25% of opportunities with Max A multimodal cues. SLP Short Term Goal 4 (Week 1): Pt will present w/ appropriate response to functional stimuli (i.e. accept spoon, grasp basic item appropriately, response to tactile stimuli)  Skilled Therapeutic Interventions:   Pt greeted in her room, as RNT was assisting to feed her lunch. Pt upright in TIS chair, maintaining head down/tilt to the R and downward eye gaze. Pt had already consumed ~80% of her tray when SLP took over w/ self feeding assistance. SLP provided bites of Dys 3 chopped pork and sweet potato mash as well as thin liquids via straw (ginger ale). No overt s/s of airway invasion were noted. Liquid wash x2 utilized at the end of the meal and no oral residue was apparent. Pt initially awake, though unresponsive to any stimuli presented (visual/tactile/verbal). Attempted to complete hand over hand assist to complete object manipulation/following directions task, though upper extremity tone negatively impacted her ability to participate in hand over hand tasks. Eventually, gentle extension was provided and she was able to lower her LUE to the lap tray. SLP provided her with a marker to attempt hand over hand writing, though pt immediately put the marker in her mouth and attempted to engage bite reflex. LUE noted to return to contracted  position upon marker removal. Pt presented w/ a soft spontaneous grunt x2 throughout tx session, though suspect involuntary vs true stimuli response given timing of the vocalizations. She responded to music via eye gaze, though maintained for ~10 seconds before pt promptly fell asleep. Unable to wake pt for any additional tx tasks. SLP reclined pt in her TIS chair in attempts to slightly alleviate forward posture. Pt left in chair w/ lap tray in place. Recommend cont ST per POC.   Pain  Unable to report pain  Therapy/Group: Individual Therapy  [name] 11/24/2022, 4:17 PM

## 2022-11-24 NOTE — Progress Notes (Signed)
Ivdu brought down with no iv and failed in starting one in [place].  MRI brain w/o done and potentially, no contrast necessary. So pt wouldn't need to come back, if true

## 2022-11-24 NOTE — Progress Notes (Signed)
PROGRESS NOTE   Subjective/Complaints: Improved recognition/smiling Continues to require total A Vitals stable Brain MRI without acute abnormalities  ROS- unable to obtain due to cognitive impairment  Objective:   MR BRAIN WO CONTRAST  Result Date: 11/24/2022 CLINICAL DATA:  Mental status change, persistent or worsening cognition. EXAM: MRI HEAD WITHOUT CONTRAST TECHNIQUE: Multiplanar, multiecho pulse sequences of the brain and surrounding structures were obtained without intravenous contrast. COMPARISON:  MRI brain 10/31/2022.  Head CT 11/15/2022. FINDINGS: Brain: No acute infarct or hemorrhage. Continued evolution of cerebral white matter signal abnormalities, greatest in the periventricular white matter and bilateral globi pallidi, suggestive of prior toxic/hypoxic insult, such as carbon monoxide poisoning. Sulcal susceptibility in the posterior frontal lobes, likely sequela of prior subarachnoid hemorrhage. No hydrocephalus or extra-axial collection. No mass or midline shift. Vascular: Normal flow voids. Skull and upper cervical spine: Normal marrow signal. Sinuses/Orbits: Unremarkable. Other: None. IMPRESSION: 1. No acute intracranial process. 2. Continued evolution of cerebral white matter signal abnormalities, greatest in the periventricular white matter and bilateral globi pallidi, suggestive of prior toxic/hypoxic insult, such as carbon monoxide poisoning. Electronically Signed   By: Orvan Falconer M.D.   On: 11/24/2022 10:56   Recent Labs    11/24/22 0625  WBC 5.5  HGB 12.9  HCT 38.5  PLT 249   Recent Labs    11/24/22 0625  CREATININE 0.79    Intake/Output Summary (Last 24 hours) at 11/24/2022 1435 Last data filed at 11/24/2022 1100 Gross per 24 hour  Intake 596 ml  Output --  Net 596 ml     Pressure Injury 10/31/22 Buttocks Left Unstageable - Full thickness tissue loss in which the base of the injury is covered  by slough (yellow, tan, gray, green or brown) and/or eschar (tan, brown or black) in the wound bed. (Active)  10/31/22 2000  Location: Buttocks  Location Orientation: Left  Staging: Unstageable - Full thickness tissue loss in which the base of the injury is covered by slough (yellow, tan, gray, green or brown) and/or eschar (tan, brown or black) in the wound bed.  Wound Description (Comments):   Present on Admission: Yes     Pressure Injury 11/18/22 Toe (Comment  which one) Anterior;Right Deep Tissue Pressure Injury - Purple or maroon localized area of discolored intact skin or blood-filled blister due to damage of underlying soft tissue from pressure and/or shear. small are (Active)  11/18/22 1300  Location: Toe (Comment  which one)  Location Orientation: Anterior;Right  Staging: Deep Tissue Pressure Injury - Purple or maroon localized area of discolored intact skin or blood-filled blister due to damage of underlying soft tissue from pressure and/or shear.  Wound Description (Comments): small area on right great toe metatarsal  Present on Admission: Yes     Pressure Injury 11/18/22 Toe (Comment  which one) Anterior;Right Stage 1 -  Intact skin with non-blanchable redness of a localized area usually over a bony prominence. small pink area of non blanchable skin on Right pinky toe (Active)  11/18/22 1900  Location: Toe (Comment  which one)  Location Orientation: Anterior;Right  Staging: Stage 1 -  Intact skin with non-blanchable redness of a localized area usually over  a bony prominence.  Wound Description (Comments): small pink area of non blanchable skin on Right pinky toe  Present on Admission: Yes     Pressure Injury 11/22/22 Toe (Comment  which one) Anterior;Right Stage 2 -  Partial thickness loss of dermis presenting as a shallow open injury with a red, pink wound bed without slough. small scratch on dorsal aspect of foot with dark purple brusing ar (Active)  11/22/22 1930  Location:  Toe (Comment  which one)  Location Orientation: Anterior;Right  Staging: Stage 2 -  Partial thickness loss of dermis presenting as a shallow open injury with a red, pink wound bed without slough.  Wound Description (Comments): small scratch on dorsal aspect of foot with dark purple brusing around the base of the foot possible medical device related to Ted hose  Present on Admission: No    Physical Exam: Vital Signs Blood pressure 122/82, pulse 83, temperature 98.5 F (36.9 C), temperature source Oral, resp. rate 16, height 5\' 1"  (1.549 m), SpO2 98%.   BMI 19.29 General: No acute distress Mood and affect are appropriate Heart: Regular rate and rhythm no rubs murmurs or extra sounds Lungs: Clear to auscultation, breathing unlabored, no rales or wheezes Abdomen: Positive bowel sounds, soft nontender to palpation, nondistended Extremities: No clubbing, cyanosis, or edema Skin: No evidence of breakdown, no evidence of rash Neurologic: pt with minimal responsiveness, smiling and making better eye contact, impaired ability to perform MMT,  increased tone and no movement to command when testing proximal UE  0/5 Bilateral , hip flexor, knee extensors, ankle dorsiflexor and plantar flexor Sensory exam unable to assess other than grimace to pinch of bilateral fingers and toes  Increased elbow flexor tone MAS3 , MAS 3 at pecs , LE tone reduced MAS 0  Cerebellar exam unable to assess  Musculoskeletal: reduced bilateral shoulder and elbow ROM due to increased tone    Assessment/Plan: 1. Functional deficits which require 3+ hours per day of interdisciplinary therapy in a comprehensive inpatient rehab setting. Physiatrist is providing close team supervision and 24 hour management of active medical problems listed below. Physiatrist and rehab team continue to assess barriers to discharge/monitor patient progress toward functional and medical goals  Care Tool:  Bathing        Body parts bathed by  helper: Right arm, Left arm, Chest, Abdomen, Front perineal area, Buttocks, Right upper leg, Left upper leg, Right lower leg, Left lower leg, Face     Bathing assist Assist Level: Dependent - Patient 0%     Upper Body Dressing/Undressing Upper body dressing   What is the patient wearing?: Pull over shirt    Upper body assist Assist Level: Dependent - Patient 0%    Lower Body Dressing/Undressing Lower body dressing      What is the patient wearing?: Pants     Lower body assist Assist for lower body dressing: Dependent - Patient 0%     Toileting Toileting Toileting Activity did not occur (Clothing management and hygiene only): N/A (no void or bm)  Toileting assist Assist for toileting: 2 Helpers     Transfers Chair/bed transfer  Transfers assist  Chair/bed transfer activity did not occur: Safety/medical concerns  Chair/bed transfer assist level: Total Assistance - Patient < 25%     Locomotion Ambulation   Ambulation assist   Ambulation activity did not occur: Safety/medical concerns (attempted with 3 musketteers and +2 for WC follow but not safe)          Walk  10 feet activity   Assist  Walk 10 feet activity did not occur: Safety/medical concerns        Walk 50 feet activity   Assist Walk 50 feet with 2 turns activity did not occur: Safety/medical concerns         Walk 150 feet activity   Assist Walk 150 feet activity did not occur: Safety/medical concerns         Walk 10 feet on uneven surface  activity   Assist Walk 10 feet on uneven surfaces activity did not occur: Safety/medical concerns         Wheelchair     Assist Is the patient using a wheelchair?: Yes Type of Wheelchair: Manual    Wheelchair assist level: Dependent - Patient 0%      Wheelchair 50 feet with 2 turns activity    Assist    Wheelchair 50 feet with 2 turns activity did not occur: Safety/medical concerns       Wheelchair 150 feet activity      Assist  Wheelchair 150 feet activity did not occur: Safety/medical concerns       Blood pressure 122/82, pulse 83, temperature 98.5 F (36.9 C), temperature source Oral, resp. rate 16, height 5\' 1"  (1.549 m), SpO2 98%.  Medical Problem List and Plan: 1. 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 Extubated 11/05/2022 Fluctuating level of alertness             -patient may  shower             -ELOS/Goals: min/modA but prognosis is guarded due to severe cognitive deficits goals 28-30d  Therapy notes reviewed  Tasia Catchings bed ordered  Mother updated  Palliative care consulted  MRI brain ordered given decline in speech, no acute abnormality evident  2.  Antithrombotics: -DVT/anticoagulation:  Pharmaceutical: Lovenox             -antiplatelet therapy: N/A 3. Pain Management: Tylenol as needed 4. Mood/Behavior/Sleep: Provide emotional support             -antipsychotic agents: N/A 5. Neuropsych/cognition: This patient is not capable of making decisions on her own behalf. 6. Left buttock unstageable ulcer.  Continue Medihoney to buttock wound daily cover with foam dressing.  Change dressing every 3 days or as needed soiling  7. Fluids/Electrolytes/Nutrition: Routine in and outs with follow-up chemistries  8.  Cavitary right lower lobe pneumonia likely aspiration pneumonia/MRSA pneumonia.  Continue course of Zyvox as indicated per infectious disease  9.  History of drug abuse.  Positive cocaine on urine drug screen.  Provide counseling  10.  AKI/hypovolemia and ATN.  Renal function much improved.  Repeat Cr today.    Latest Ref Rng & Units 11/24/2022    6:25 AM 11/15/2022    4:23 AM 11/14/2022    4:09 AM  BMP  Glucose 70 - 99 mg/dL  93  99   BUN 6 - 20 mg/dL  17  15   Creatinine 4.40 - 1.00 mg/dL 1.02  7.25  3.66   Sodium 135 - 145 mmol/L  136  134   Potassium 3.5 - 5.1 mmol/L  3.9  4.1   Chloride 98 - 111 mmol/L  102   102   CO2 22 - 32 mmol/L  24  23   Calcium 8.9 - 10.3 mg/dL  8.5  8.3     11.  Mild transaminitis with rhabdomyolysis.  CK reviewed and normalized  12.  Hypotension.  Improving, decrease ProAmatine to 5mg  daily  13. Impaired initiation: continue amantadine to 200mg  BID  14. Suboptimal vitamin D: continue 1,000U D3 daily  15. Spasticity: increase baclofen to 10mg  TID, BP reviewed and stable with this increase  16. Bowel and bladder incontinence: bowel and bladder program    LOS: 6 days A FACE TO FACE EVALUATION WAS PERFORMED  Drema Pry Sayf Kerner 11/24/2022, 2:35 PM

## 2022-11-24 NOTE — Progress Notes (Signed)
Occupational Therapy Weekly Progress Note  Patient Details  Name: Nicole [name] MRN: [id number] Date of Birth: 1976/04/13  Beginning of progress report period: July 7,2024 End of progress report period: November 24, 2022  Today's Date: 11/24/2022 OT Individual Time: 1000-1055 OT Individual Time Calculation (min): 55 min    Patient has met 0 of 3 short term goals.  Patient is making progress in her ability to respond to stimulus, maintain arousal for short periods of time, and occasionally participate in functional tasks momentarily.    Patient continues to demonstrate the following deficits: muscle weakness and muscle joint tightness, decreased cardiorespiratoy endurance, abnormal tone, unbalanced muscle activation, motor apraxia, and decreased motor planning, decreased visual acuity and decreased visual perceptual skills, decreased attention to left, left side neglect, and decreased motor planning, decreased initiation, decreased attention, and demonstrates behaviors consistent with [place] Level 3, and decreased sitting balance, decreased postural control, hemiplegia, and decreased balance strategies and therefore will continue to benefit from skilled OT intervention to enhance overall performance with BADL and Reduce care partner burden.  Patient progressing toward long term goals..  Continue plan of care.  OT Short Term Goals Week 1:  OT Short Term Goal 1 (Week 1): Pt will initiate simple ADL task with Max multimodal cuing. OT Short Term Goal 1 - Progress (Week 1): Not met OT Short Term Goal 2 (Week 1): Pt will attend to ADL task for >2 min in distraction-free environment. OT Short Term Goal 2 - Progress (Week 1): Not met OT Short Term Goal 3 (Week 1): Pt will express preference between two options in preparation for self-advocacy during medical care. OT Short Term Goal 3 - Progress (Week 1): Not met Week 2:  OT Short Term Goal 1 (Week 2): Patient will visually attend to self in mirror x 3-5  seconds with max cueing as prerequisite to grooming tasks OT Short Term Goal 2 (Week 2): Patient will participate following hand over hand cueing in familiar functional task - e.g. brushing hair, brushing teeth, washing body with RUE and mod assist OT Short Term Goal 3 (Week 2): Patient will sit unsupported x 5 seconds in preparation for level surface transfer OT Short Term Goal 4 (Week 2): Patient will demonstrate localized response (looking toward)to auditory stimulus (name being called) 3 of 5 trials from supported position. OT Short Term Goal 5 (Week 2): Patient will demonstrate improved passive or active assisted elbow range of motion in preparation for assistance with BADL.  Right -5* extension, Left - 15* extension  Skilled Therapeutic Interventions/Progress Updates:   Patient received supine in bed - just returned from MRI.  Patient awake, eyes open and immediately turned her head to her name being called and smiled.  Patient changed into dry brief and pants and assisted out of bed - total assist.  Played music during session, and patient immediately alerted and looked for source of sound.  Patient sat at sink with minimal recline and participated in bathing face, chest.  Assisted patient into clean tshirt - total assist.  Patient's hairbrush placed in R hand and hand guided to hair, assisted with first brush strokes, then patient completed 5 effective hair brush strokes. Patient transported to gym and transferred to mat table.  Ue's supported on elevated bedside table to reduce the weight on anterior chest.  Worked on head and trunk control form seated position.  When arms placed at her sides - able to maintain static sitting position x 3 seconds before pulling forward or  falling backward.  Returned to chair for direct hand off to PT.    Therapy Documentation Precautions:  Precautions Precautions: Fall Precaution Comments: Contact precautions & delayed processing Restrictions Weight Bearing  Restrictions: No   Pain: Pain Assessment Pain Scale: Faces Faces Pain Scale: No hurt     Therapy/Group: Individual Therapy  [name] 11/24/2022, 12:35 PM

## 2022-11-24 NOTE — Progress Notes (Signed)
Physical Therapy Session Note  Patient Details  Name: Nicole [name] MRN: [id number] Date of Birth: 01-Feb-1976  Today's Date: 11/24/2022 PT Individual Time: 4098-1191 PT Individual Time Calculation (min): 54 min   Short Term Goals: Week 1:  PT Short Term Goal 1 (Week 1): pt will perform sit<>stand with +2 max A PT Short Term Goal 2 (Week 1): pt will initiate gait training PT Short Term Goal 3 (Week 1): Pt will perform bed<>chair transfer with +2 max A  Skilled Therapeutic Interventions/Progress Updates: Pt presented in TIS. Pt non-verbal unable to indicate pain but does not appear in any distress. Pt transported to main gym. Completed dependent squat pivot transfer to mat. In sitting pt with forward flexed posture and constantly scratching scab on scalp. PTA providing fidget in hand to distract  away from scab which would work intermittently. With tech sitting posteriorly to pt PTA gently performing elbow extension to LUE and with time was able to straighten arm and ultimately place on mat. Therapist intermittently grasping R hand to try and place on mat but unable to maintain due to pt constantly moving hand. Pt did not follow commands during session however with facilitation at shoulders to open chest pt was able to consistently maintain CGA sitting balance with PTA holding pt's R hand. With fatigue pt noted to have increased R lateral lean but no overt LOB. Pt was dependently transferred back to TIS and transported back to room. PTA performed total A Sit to stand with PTA supporting hips and while in standing with PTA blocking knees performed gentle weight shifting (rocking in standing) x 5 each side prior to completing transfer to bed. Total A to complete sit to supine transfer and +2 dependent to boost to [facility]. PTA placed rolled towel behind pt's neck with bias to R to limit cervical flexion, and smaller flat pillow placed behind pt's head to limit flexion. A pillow was also placed inside pt's L elbow  to limit flexion. Pt left in bed resting in NAD.      Therapy Documentation Precautions:  Precautions Precautions: Fall Precaution Comments: Contact precautions & delayed processing Restrictions Weight Bearing Restrictions: No General:   Vital Signs: Therapy Vitals Temp: 98.5 F (36.9 C) Temp Source: Oral Pulse Rate: 83 Resp: 16 BP: 122/82 Patient Position (if appropriate): Sitting Oxygen Therapy SpO2: 98 % O2 Device: Room Air   Therapy/Group: Individual Therapy  Ghislaine Harcum 11/24/2022, 3:40 PM

## 2022-11-25 DIAGNOSIS — R252 Cramp and spasm: Secondary | ICD-10-CM

## 2022-11-25 DIAGNOSIS — R5383 Other fatigue: Secondary | ICD-10-CM

## 2022-11-25 MED ORDER — METHYLPHENIDATE HCL 5 MG PO TABS: 5.0000 mg | ORAL_TABLET | Freq: Two times a day (BID) | ORAL | Status: DC

## 2022-11-25 NOTE — Progress Notes (Signed)
PROGRESS NOTE   Subjective/Complaints: Pt sitting in bed. Appears comfortable. No problems reported overnight.   ROS: Limited due to cognitive/behavioral   Objective:   MR BRAIN WO CONTRAST  Result Date: 11/24/2022 CLINICAL DATA:  Mental status change, persistent or worsening cognition. EXAM: MRI HEAD WITHOUT CONTRAST TECHNIQUE: Multiplanar, multiecho pulse sequences of the brain and surrounding structures were obtained without intravenous contrast. COMPARISON:  MRI brain 10/31/2022.  Head CT 11/15/2022. FINDINGS: Brain: No acute infarct or hemorrhage. Continued evolution of cerebral white matter signal abnormalities, greatest in the periventricular white matter and bilateral globi pallidi, suggestive of prior toxic/hypoxic insult, such as carbon monoxide poisoning. Sulcal susceptibility in the posterior frontal lobes, likely sequela of prior subarachnoid hemorrhage. No hydrocephalus or extra-axial collection. No mass or midline shift. Vascular: Normal flow voids. Skull and upper cervical spine: Normal marrow signal. Sinuses/Orbits: Unremarkable. Other: None. IMPRESSION: 1. No acute intracranial process. 2. Continued evolution of cerebral white matter signal abnormalities, greatest in the periventricular white matter and bilateral globi pallidi, suggestive of prior toxic/hypoxic insult, such as carbon monoxide poisoning. Electronically Signed   By: Orvan Falconer M.D.   On: 11/24/2022 10:56   Recent Labs    11/24/22 0625  WBC 5.5  HGB 12.9  HCT 38.5  PLT 249   Recent Labs    11/24/22 0625  CREATININE 0.79    Intake/Output Summary (Last 24 hours) at 11/25/2022 1021 Last data filed at 11/25/2022 0800 Gross per 24 hour  Intake 592 ml  Output --  Net 592 ml     Pressure Injury 10/31/22 Buttocks Left Unstageable - Full thickness tissue loss in which the base of the injury is covered by slough (yellow, tan, gray, green or brown)  and/or eschar (tan, brown or black) in the wound bed. (Active)  10/31/22 2000  Location: Buttocks  Location Orientation: Left  Staging: Unstageable - Full thickness tissue loss in which the base of the injury is covered by slough (yellow, tan, gray, green or brown) and/or eschar (tan, brown or black) in the wound bed.  Wound Description (Comments):   Present on Admission: Yes     Pressure Injury 11/18/22 Toe (Comment  which one) Anterior;Right Deep Tissue Pressure Injury - Purple or maroon localized area of discolored intact skin or blood-filled blister due to damage of underlying soft tissue from pressure and/or shear. small are (Active)  11/18/22 1300  Location: Toe (Comment  which one)  Location Orientation: Anterior;Right  Staging: Deep Tissue Pressure Injury - Purple or maroon localized area of discolored intact skin or blood-filled blister due to damage of underlying soft tissue from pressure and/or shear.  Wound Description (Comments): small area on right great toe metatarsal  Present on Admission: Yes     Pressure Injury 11/18/22 Toe (Comment  which one) Anterior;Right Stage 1 -  Intact skin with non-blanchable redness of a localized area usually over a bony prominence. small pink area of non blanchable skin on Right pinky toe (Active)  11/18/22 1900  Location: Toe (Comment  which one)  Location Orientation: Anterior;Right  Staging: Stage 1 -  Intact skin with non-blanchable redness of a localized area usually over a bony prominence.  Wound  Description (Comments): small pink area of non blanchable skin on Right pinky toe  Present on Admission: Yes     Pressure Injury 11/22/22 Toe (Comment  which one) Anterior;Right Stage 2 -  Partial thickness loss of dermis presenting as a shallow open injury with a red, pink wound bed without slough. small scratch on dorsal aspect of foot with dark purple brusing ar (Active)  11/22/22 1930  Location: Toe (Comment  which one)  Location  Orientation: Anterior;Right  Staging: Stage 2 -  Partial thickness loss of dermis presenting as a shallow open injury with a red, pink wound bed without slough.  Wound Description (Comments): small scratch on dorsal aspect of foot with dark purple brusing around the base of the foot possible medical device related to Ted hose  Present on Admission: No    Physical Exam: Vital Signs Blood pressure 105/84, pulse 78, temperature 98.1 F (36.7 C), temperature source Oral, resp. rate 18, height 5\' 1"  (1.549 m), SpO2 99%.   Constitutional: No distress . Vital signs reviewed. HEENT: NCAT, EOMI, oral membranes moist Neck: supple Cardiovascular: RRR without murmur. No JVD    Respiratory/Chest: CTA Bilaterally without wheezes or rales. Normal effort    GI/Abdomen: BS +, non-tender, non-distended Ext: no clubbing, cyanosis, or edema Psych: pleasant and cooperative  Skin: No evidence of breakdown, no evidence of rash Neurologic: pt with minimal responsiveness, makes some eye contact but that's inconsistent.,  increased tone and no movement to command when testing proximal UE  0/5 Bilateral , hip flexor, knee extensors, ankle dorsiflexor and plantar flexor Sensory exam unable to assess other than grimace to pinch of bilateral fingers and toes  Increased elbow flexor tone MAS3 , MAS 3 at pecs , LE tone reduced MAS 0  Cerebellar exam unable to assess  Musculoskeletal: reduced bilateral shoulder and elbow ROM due to increased tone    Assessment/Plan: 1. Functional deficits which require 3+ hours per day of interdisciplinary therapy in a comprehensive inpatient rehab setting. Physiatrist is providing close team supervision and 24 hour management of active medical problems listed below. Physiatrist and rehab team continue to assess barriers to discharge/monitor patient progress toward functional and medical goals  Care Tool:  Bathing        Body parts bathed by helper: Right arm, Left arm, Chest,  Abdomen, Front perineal area, Buttocks, Right upper leg, Left upper leg, Right lower leg, Left lower leg, Face     Bathing assist Assist Level: Dependent - Patient 0%     Upper Body Dressing/Undressing Upper body dressing   What is the patient wearing?: Pull over shirt    Upper body assist Assist Level: Dependent - Patient 0%    Lower Body Dressing/Undressing Lower body dressing      What is the patient wearing?: Pants     Lower body assist Assist for lower body dressing: Dependent - Patient 0%     Toileting Toileting Toileting Activity did not occur (Clothing management and hygiene only): N/A (no void or bm)  Toileting assist Assist for toileting: 2 Helpers     Transfers Chair/bed transfer  Transfers assist  Chair/bed transfer activity did not occur: Safety/medical concerns  Chair/bed transfer assist level: Total Assistance - Patient < 25%     Locomotion Ambulation   Ambulation assist   Ambulation activity did not occur: Safety/medical concerns (attempted with 3 musketteers and +2 for WC follow but not safe)          Walk 10 feet activity  Assist  Walk 10 feet activity did not occur: Safety/medical concerns        Walk 50 feet activity   Assist Walk 50 feet with 2 turns activity did not occur: Safety/medical concerns         Walk 150 feet activity   Assist Walk 150 feet activity did not occur: Safety/medical concerns         Walk 10 feet on uneven surface  activity   Assist Walk 10 feet on uneven surfaces activity did not occur: Safety/medical concerns         Wheelchair     Assist Is the patient using a wheelchair?: Yes Type of Wheelchair: Manual    Wheelchair assist level: Dependent - Patient 0%      Wheelchair 50 feet with 2 turns activity    Assist    Wheelchair 50 feet with 2 turns activity did not occur: Safety/medical concerns       Wheelchair 150 feet activity     Assist  Wheelchair 150 feet  activity did not occur: Safety/medical concerns       Blood pressure 105/84, pulse 78, temperature 98.1 F (36.7 C), temperature source Oral, resp. rate 18, height 5\' 1"  (1.549 m), SpO2 99%.  Medical Problem List and Plan: 1. 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 Extubated 11/05/2022 Fluctuating level of alertness             -patient may  shower             -ELOS/Goals: min/modA but prognosis is guarded due to severe cognitive deficits goals 28-30d  -Continue CIR therapies including PT, OT, and SLP   Craig bed ordered  Mother updated  Palliative care consulted  MRI brain ordered given decline in speech-->no acute abnormality evident  2.  Antithrombotics: -DVT/anticoagulation:  Pharmaceutical: Lovenox             -antiplatelet therapy: N/A 3. Pain Management: Tylenol as needed 4. Mood/Behavior/Sleep: Provide emotional support             -antipsychotic agents: N/A 5. Neuropsych/cognition: This patient is not capable of making decisions on her own behalf. 6. Left buttock unstageable ulcer.  Continue Medihoney to buttock wound daily cover with foam dressing.  Change dressing every 3 days or as needed soiling  7. Fluids/Electrolytes/Nutrition: Routine in and outs with follow-up chemistries  8.  Cavitary right lower lobe pneumonia likely aspiration pneumonia/MRSA pneumonia.  Continue course of Zyvox as indicated per infectious disease  9.  History of drug abuse.  Positive cocaine on urine drug screen.  Provide counseling  10.  AKI/hypovolemia and ATN.  Renal function much improved.  Repeat Cr today.    Latest Ref Rng & Units 11/24/2022    6:25 AM 11/15/2022    4:23 AM 11/14/2022    4:09 AM  BMP  Glucose 70 - 99 mg/dL  93  99   BUN 6 - 20 mg/dL  17  15   Creatinine 1.61 - 1.00 mg/dL 0.96  0.45  4.09   Sodium 135 - 145 mmol/L  136  134   Potassium 3.5 - 5.1 mmol/L  3.9  4.1   Chloride 98 - 111 mmol/L  102  102    CO2 22 - 32 mmol/L  24  23   Calcium 8.9 - 10.3 mg/dL  8.5  8.3     11.  Mild transaminitis with rhabdomyolysis.  CK reviewed and normalized  12.  Hypotension.  Improving, decrease ProAmatine to 5mg  daily  13. Impaired initiation: continue amantadine to 100mg  BID--still minimal   7/13--add ritalin 5mg  bid  14. Suboptimal vitamin D: continue 1,000U D3 daily  15. Spasticity: increase baclofen to 10mg  TID, BP reviewed and stable with this increase  16. Bowel and bladder incontinence: bowel and bladder program    LOS: 7 days A FACE TO FACE EVALUATION WAS PERFORMED  Ranelle Oyster 11/25/2022, 10:21 AM

## 2022-11-26 MED ORDER — AMANTADINE HCL 100 MG PO CAPS
200.0000 mg | ORAL_CAPSULE | Freq: Two times a day (BID) | ORAL | Status: DC
  Administered 2022-11-26 – 2022-12-01 (×11): 200 mg via ORAL
  Filled 2022-11-26 (×11): qty 2

## 2022-11-26 NOTE — Progress Notes (Signed)
Physical Therapy Weekly Progress Note  Patient Details  Name: Nicole [name] MRN: [id number] Date of Birth: [birth year]  Beginning of progress report period: November 19, 2022 End of progress report period: November 26, 2022  Patient has met 0 of 3 short term goals. [name] has made minimal functional progress this reporting period. She continues to require dependent care for all functional mobility tasks, including rolling in bed, supine<>sitting, and squat pivot transfers. Working on upright tolerance and weight bearing through both legs via Tilt Table where she is tolerating up to ~65-70deg of tilt for short periods of time and ~50deg of tilt for longer periods of time. She continues to shoe behaviors consistent with a Rancho Level 3, is nonverbal with poor command follow, and developing significant tone in all x4 extremities. KREG bed has been ordered to assist with tilting in room and air mattress for skin integrity. Palliative care has been consulted for goals of care, patient now DNR with plan for SNF placement per notes.   Patient continues to demonstrate the following deficits muscle weakness and muscle joint tightness, decreased cardiorespiratoy endurance, abnormal tone, unbalanced muscle activation, and motor apraxia, decreased visual perceptual skills and decreased visual motor skills, decreased midline orientation, decreased initiation, decreased attention, decreased awareness, decreased problem solving, decreased safety awareness, decreased memory, delayed processing, and demonstrates behaviors consistent with Rancho Level 3, and decreased sitting balance, decreased standing balance, decreased postural control, hemiplegia, and decreased balance strategies and therefore will continue to benefit from skilled PT intervention to increase functional independence with mobility.  Patient not progressing toward long term goals.  See goal revision..   See POC note for details. DC ambulation and car transfer  goal  PT Short Term Goals Week 1:  PT Short Term Goal 1 (Week 1): pt will perform sit<>stand with +2 max A PT Short Term Goal 1 - Progress (Week 1): Not met PT Short Term Goal 2 (Week 1): pt will initiate gait training PT Short Term Goal 2 - Progress (Week 1): Not met PT Short Term Goal 3 (Week 1): Pt will perform bed<>chair transfer with +2 max A PT Short Term Goal 3 - Progress (Week 1): Not met Week 2:  PT Short Term Goal 1 (Week 2): Pt will initiate bed mobility but require no more than totalA PT Short Term Goal 2 (Week 2): Pt will maintain sitting balance unsupported for 30 seconds PT Short Term Goal 3 (Week 2): Pt will demonstrate command follow for ~15% of tasks PT Short Term Goal 4 (Week 2): Pt will be assessed for custom wheelchair needs   Therapy Documentation Precautions:  Precautions Precautions: Fall Precaution Comments: Contact precautions & delayed processing Restrictions Weight Bearing Restrictions: No General:     Therapy/Group: Individual Therapy  Wavie Hashimi P Datha Kissinger  PT, DPT, CSRS  11/26/2022, 7:46 AM

## 2022-11-26 NOTE — Progress Notes (Signed)
PROGRESS NOTE   Subjective/Complaints: Pt without changes. Unremarkable night. Really not engaging much  ROS: Limited due to cognitive/behavioral     Objective:   MR BRAIN WO CONTRAST  Result Date: 11/24/2022 CLINICAL DATA:  Mental status change, persistent or worsening cognition. EXAM: MRI HEAD WITHOUT CONTRAST TECHNIQUE: Multiplanar, multiecho pulse sequences of the brain and surrounding structures were obtained without intravenous contrast. COMPARISON:  MRI brain 10/31/2022.  Head CT 11/15/2022. FINDINGS: Brain: No acute infarct or hemorrhage. Continued evolution of cerebral white matter signal abnormalities, greatest in the periventricular white matter and bilateral globi pallidi, suggestive of prior toxic/hypoxic insult, such as carbon monoxide poisoning. Sulcal susceptibility in the posterior frontal lobes, likely sequela of prior subarachnoid hemorrhage. No hydrocephalus or extra-axial collection. No mass or midline shift. Vascular: Normal flow voids. Skull and upper cervical spine: Normal marrow signal. Sinuses/Orbits: Unremarkable. Other: None. IMPRESSION: 1. No acute intracranial process. 2. Continued evolution of cerebral white matter signal abnormalities, greatest in the periventricular white matter and bilateral globi pallidi, suggestive of prior toxic/hypoxic insult, such as carbon monoxide poisoning. Electronically Signed   By: Orvan Falconer M.D.   On: 11/24/2022 10:56   Recent Labs    11/24/22 0625  WBC 5.5  HGB 12.9  HCT 38.5  PLT 249   Recent Labs    11/24/22 0625  CREATININE 0.79   No intake or output data in the 24 hours ending 11/26/22 0909    Pressure Injury 10/31/22 Buttocks Left Unstageable - Full thickness tissue loss in which the base of the injury is covered by slough (yellow, tan, gray, green or brown) and/or eschar (tan, brown or black) in the wound bed. (Active)  10/31/22 2000  Location: Buttocks   Location Orientation: Left  Staging: Unstageable - Full thickness tissue loss in which the base of the injury is covered by slough (yellow, tan, gray, green or brown) and/or eschar (tan, brown or black) in the wound bed.  Wound Description (Comments):   Present on Admission: Yes     Pressure Injury 11/18/22 Toe (Comment  which one) Anterior;Right Deep Tissue Pressure Injury - Purple or maroon localized area of discolored intact skin or blood-filled blister due to damage of underlying soft tissue from pressure and/or shear. small are (Active)  11/18/22 1300  Location: Toe (Comment  which one)  Location Orientation: Anterior;Right  Staging: Deep Tissue Pressure Injury - Purple or maroon localized area of discolored intact skin or blood-filled blister due to damage of underlying soft tissue from pressure and/or shear.  Wound Description (Comments): small area on right great toe metatarsal  Present on Admission: Yes     Pressure Injury 11/18/22 Toe (Comment  which one) Anterior;Right Stage 1 -  Intact skin with non-blanchable redness of a localized area usually over a bony prominence. small pink area of non blanchable skin on Right pinky toe (Active)  11/18/22 1900  Location: Toe (Comment  which one)  Location Orientation: Anterior;Right  Staging: Stage 1 -  Intact skin with non-blanchable redness of a localized area usually over a bony prominence.  Wound Description (Comments): small pink area of non blanchable skin on Right pinky toe  Present on Admission: Yes  Pressure Injury 11/22/22 Toe (Comment  which one) Anterior;Right Stage 2 -  Partial thickness loss of dermis presenting as a shallow open injury with a red, pink wound bed without slough. small scratch on dorsal aspect of foot with dark purple brusing ar (Active)  11/22/22 1930  Location: Toe (Comment  which one)  Location Orientation: Anterior;Right  Staging: Stage 2 -  Partial thickness loss of dermis presenting as a shallow open  injury with a red, pink wound bed without slough.  Wound Description (Comments): small scratch on dorsal aspect of foot with dark purple brusing around the base of the foot possible medical device related to Ted hose  Present on Admission: No    Physical Exam: Vital Signs Blood pressure (!) 122/96, pulse 94, temperature (!) 97.5 F (36.4 C), temperature source Oral, resp. rate 18, height 5\' 1"  (1.549 m), SpO2 98%.  General: No acute distress HEENT: NCAT, EOMI, oral membranes moist Cards: reg rate  Chest: normal effort Abdomen: Soft, NT, ND Skin: dry, intact Extremities: no edema Psych: pleasant and appropriate  Skin: No evidence of breakdown, no evidence of rash Neurologic: pt still barely responds to verbal cues, makes some eye contact but still inconsistent.,  increased tone and no movement to command when testing proximal UE  0/5 Bilateral , hip flexor, knee extensors, ankle dorsiflexor and plantar flexor Sensory exam unable to assess other than grimace to pinch of bilateral fingers and toes  Increased elbow flexor tone MAS3 , MAS 3 at pecs , LE tone reduced MAS 0  Cerebellar exam unable to assess  Musculoskeletal: reduced bilateral shoulder and elbow ROM due to increased tone    Assessment/Plan: 1. Functional deficits which require 3+ hours per day of interdisciplinary therapy in a comprehensive inpatient rehab setting. Physiatrist is providing close team supervision and 24 hour management of active medical problems listed below. Physiatrist and rehab team continue to assess barriers to discharge/monitor patient progress toward functional and medical goals  Care Tool:  Bathing        Body parts bathed by helper: Right arm, Left arm, Chest, Abdomen, Front perineal area, Buttocks, Right upper leg, Left upper leg, Right lower leg, Left lower leg, Face     Bathing assist Assist Level: Dependent - Patient 0%     Upper Body Dressing/Undressing Upper body dressing   What is  the patient wearing?: Pull over shirt    Upper body assist Assist Level: Dependent - Patient 0%    Lower Body Dressing/Undressing Lower body dressing      What is the patient wearing?: Pants     Lower body assist Assist for lower body dressing: Dependent - Patient 0%     Toileting Toileting Toileting Activity did not occur (Clothing management and hygiene only): N/A (no void or bm)  Toileting assist Assist for toileting: 2 Helpers     Transfers Chair/bed transfer  Transfers assist  Chair/bed transfer activity did not occur: Safety/medical concerns  Chair/bed transfer assist level: Total Assistance - Patient < 25%     Locomotion Ambulation   Ambulation assist   Ambulation activity did not occur: Safety/medical concerns (attempted with 3 musketteers and +2 for WC follow but not safe)          Walk 10 feet activity   Assist  Walk 10 feet activity did not occur: Safety/medical concerns        Walk 50 feet activity   Assist Walk 50 feet with 2 turns activity did not occur: Safety/medical concerns  Walk 150 feet activity   Assist Walk 150 feet activity did not occur: Safety/medical concerns         Walk 10 feet on uneven surface  activity   Assist Walk 10 feet on uneven surfaces activity did not occur: Safety/medical concerns         Wheelchair     Assist Is the patient using a wheelchair?: Yes Type of Wheelchair: Manual    Wheelchair assist level: Dependent - Patient 0%      Wheelchair 50 feet with 2 turns activity    Assist    Wheelchair 50 feet with 2 turns activity did not occur: Safety/medical concerns       Wheelchair 150 feet activity     Assist  Wheelchair 150 feet activity did not occur: Safety/medical concerns       Blood pressure (!) 122/96, pulse 94, temperature (!) 97.5 F (36.4 C), temperature source Oral, resp. rate 18, height 5\' 1"  (1.549 m), SpO2 98%.  Medical Problem List and  Plan: 1. 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 Extubated 11/05/2022 Fluctuating level of alertness             -patient may  shower             -ELOS/Goals: min/modA but prognosis is guarded due to severe cognitive deficits goals 28-30d  -Continue CIR therapies including PT, OT, and SLP    Craig bed ordered  Mother updated  Palliative care consulted  MRI brain ordered given decline in speech-->no acute abnormality evident  2.  Antithrombotics: -DVT/anticoagulation:  Pharmaceutical: Lovenox             -antiplatelet therapy: N/A 3. Pain Management: Tylenol as needed 4. Mood/Behavior/Sleep: Provide emotional support             -antipsychotic agents: N/A 5. Neuropsych/cognition: This patient is not capable of making decisions on her own behalf. 6. Left buttock unstageable ulcer.  Continue Medihoney to buttock wound daily cover with foam dressing.  Changing dressing every 3 days or as needed soiling  7. Fluids/Electrolytes/Nutrition: Routine in and outs with follow-up chemistries  8.  Cavitary right lower lobe pneumonia likely aspiration pneumonia/MRSA pneumonia.  Continue course of Zyvox as indicated per infectious disease  9.  History of drug abuse.  Positive cocaine on urine drug screen.  Provide counseling  10.  AKI/hypovolemia and ATN.  Renal function much improved.  Repeat Cr today.    Latest Ref Rng & Units 11/24/2022    6:25 AM 11/15/2022    4:23 AM 11/14/2022    4:09 AM  BMP  Glucose 70 - 99 mg/dL  93  99   BUN 6 - 20 mg/dL  17  15   Creatinine 0.45 - 1.00 mg/dL 4.09  8.11  9.14   Sodium 135 - 145 mmol/L  136  134   Potassium 3.5 - 5.1 mmol/L  3.9  4.1   Chloride 98 - 111 mmol/L  102  102   CO2 22 - 32 mmol/L  24  23   Calcium 8.9 - 10.3 mg/dL  8.5  8.3     11.  Mild transaminitis with rhabdomyolysis.  CK reviewed and normalized  12.  Hypotension.  Improving, decrease ProAmatine to 5mg  daily  13.  Impaired initiation: continue amantadine to 100mg  BID--still minimal   7/14 had hoped to add ritalin but it's contraindicated with Zyvox (MAOI). Can't use provigil either.  -will bump amantadine to 200mg   bid  14. Suboptimal vitamin D: continue 1,000U D3 daily  15. Spasticity: increase baclofen to 10mg  TID, BP reviewed and stable with this increase  16. Bowel and bladder incontinence: bowel and bladder program    LOS: 8 days A FACE TO FACE EVALUATION WAS PERFORMED  Ranelle Oyster 11/26/2022, 9:09 AM

## 2022-11-26 NOTE — Plan of Care (Signed)
DC goals - anticipate these will be unsafe for family due to severity of care and slower than anticipated progress.  Problem: RH Car Transfers Goal: LTG Patient will perform car transfers with assist (PT) Description: LTG: Patient will perform car transfers with assistance (PT). Outcome: Not Applicable   Problem: RH Ambulation Goal: LTG Patient will ambulate in controlled environment (PT) Description: LTG: Patient will ambulate in a controlled environment, # of feet with assistance (PT). Outcome: Not Applicable    Downgraded goals due to severity of care and slower anticipated progress.   Problem: RH Balance Goal: LTG Patient will maintain dynamic sitting balance (PT) Description: LTG:  Patient will maintain dynamic sitting balance with assistance during mobility activities (PT) Flowsheets (Taken 11/26/2022 0753) LTG: Pt will maintain dynamic sitting balance during mobility activities with:: Maximal Assistance - Patient 25 - 49% Goal: LTG Patient will maintain dynamic standing balance (PT) Description: LTG:  Patient will maintain dynamic standing balance with assistance during mobility activities (PT) Flowsheets (Taken 11/26/2022 0753) LTG: Pt will maintain dynamic standing balance during mobility activities with:: Total Assistance - Patient < 25%   Problem: Sit to Stand Goal: LTG:  Patient will perform sit to stand with assistance level (PT) Description: LTG:  Patient will perform sit to stand with assistance level (PT) Flowsheets (Taken 11/26/2022 0753) LTG: PT will perform sit to stand in preparation for functional mobility with assistance level: Maximal Assistance - Patient 25 - 49%   Problem: RH Bed Mobility Goal: LTG Patient will perform bed mobility with assist (PT) Description: LTG: Patient will perform bed mobility with assistance, with/without cues (PT). Flowsheets (Taken 11/26/2022 0753) LTG: Pt will perform bed mobility with assistance level of: Maximal Assistance -  Patient 25 - 49%   Problem: RH Bed to Chair Transfers Goal: LTG Patient will perform bed/chair transfers w/assist (PT) Description: LTG: Patient will perform bed to chair transfers with assistance (PT). Flowsheets (Taken 11/26/2022 0753) LTG: Pt will perform Bed to Chair Transfers with assistance level: Total Assistance - Patient < 25%

## 2022-11-26 NOTE — Progress Notes (Signed)
Physical Therapy Session Note  Patient Details  Name: Nicole [name] MRN: [id number] Date of Birth: 10-30-1975  Today's Date: 11/26/2022 PT Individual Time: 4098-1191 PT Individual Time Calculation (min): 44 min   Short Term Goals: Week 2:  PT Short Term Goal 1 (Week 2): Pt will initiate bed mobility but require no more than totalA PT Short Term Goal 2 (Week 2): Pt will maintain sitting balance unsupported for 30 seconds PT Short Term Goal 3 (Week 2): Pt will demonstrate command follow for ~15% of tasks PT Short Term Goal 4 (Week 2): Pt will be assessed for custom wheelchair needs  Skilled Therapeutic Interventions/Progress Updates:      Pt in bed to start - in KREG bed with a R mit restraint on. No signs of pain observed. Removed R mit restraint and noted redness on knuckes, likely from rubbing in the mit - RN made aware. Removed mit during session and patient frequently reaching for scalp to itch.   Focused session on using KREG bed for tilting features to promote standing. X3 straps applied in bed (above knees, at pelvis, and at chest near axilla). Gradual tilting to up to 61degrees with PT managing ankles to promote neutral positioning and weight bearing. Patient needing assist also for cervical extension due to chucked tin and insufficient neck control. Tried towel rolls and other methods to keep neck neutral while PT managed feet but unsuccessful.   Returned to flat position and stretching LE hamstrings, adductors for PROM. Changed patient as she was incontinent of urine with wet brief, dependent assist at bed level. Applied moon boots bilaterally for offloading and pressure relief. Verified with RN her code status as "full code" but should be "DNR" per palliative notes. R mit restraint reapplied, bed lowered, all needs met.   Therapy Documentation Precautions:  Precautions Precautions: Fall Precaution Comments: Contact precautions & delayed processing Restrictions Weight Bearing  Restrictions: No General:      Therapy/Group: Individual Therapy  Orrin Brigham 11/26/2022, 7:56 AM

## 2022-11-26 NOTE — Progress Notes (Signed)
Physical Therapy Session Note  Patient Details  Name: Nicole [name] MRN: [id number] Date of Birth: 26-Sep-1975  Today's Date: 11/26/2022 PT Individual Time: 1350-1445 PT Individual Time Calculation (min): 55 min   Short Term Goals: Week 2:  PT Short Term Goal 1 (Week 2): Pt will initiate bed mobility but require no more than totalA PT Short Term Goal 2 (Week 2): Pt will maintain sitting balance unsupported for 30 seconds PT Short Term Goal 3 (Week 2): Pt will demonstrate command follow for ~15% of tasks PT Short Term Goal 4 (Week 2): Pt will be assessed for custom wheelchair needs  Skilled Therapeutic Interventions/Progress Updates:       Pt in bed sleeping to start - awakens to voice but maintains eyes closed., appears more fatigued today.  Supine<>sitting EOB with dependent assist, no effort made even with ++ time for initiation and processing. Dependent squat pivot transfer from EOB to w/c with +1 assist. Transported to main rehab gym and assisted to mat table in similar manner.   Patient assisted into supine position with dependent assist and then with +2 assist, patient positioned into prone using [place] under her head to promote cervical extension. +2 assist for positioning, BUE positioning into external rotation and propping. Patient able to tolerate prone position for only short periods of time, breathing heavily and face turning red each time.   In sidelying position stretched her hip extensors which were able to reach ~5deg hip extension bilaterally. Also stretched her quads in sidelying too.   Returned to TIS w/c via dependent squat pivot transfer. Reclined in TIS and returned to room - provided full lap tray and pillows to support positioning and protect bony prominences. All needs met at end of treatment.    Therapy Documentation Precautions:  Precautions Precautions: Fall Precaution Comments: Contact precautions & delayed processing Restrictions Weight Bearing  Restrictions: No General:      Therapy/Group: Individual Therapy  Orrin Brigham 11/26/2022, 2:32 PM

## 2022-11-26 NOTE — Progress Notes (Signed)
Unable to assess neuro.  Anterior lungs clear bilaterally.  Heart sounds normal.  Active bowel sounds in all four quadrants.  Patient grimacing during sacral dressing change. Pain assessment based on facial expressions at a 4.  Sacral wound unstageable, wound appears clean and a new dressing was applied.   I agree with this student's documentation.

## 2022-11-26 NOTE — Progress Notes (Cosign Needed)
Unable to assess neuro.  Anterior lungs clear bilaterally.  Heart sounds normal.  Active bowel sounds in all four quadrants.  Patient grimacing during sacral dressing change. Pain assessment based on facial expressions at a 4.  Sacral wound unstageable, wound appears clean and a new dressing was applied.

## 2022-11-27 NOTE — Progress Notes (Signed)
Occupational Therapy Session Note  Patient Details  Name: Nicole [name] MRN: [id number] Date of Birth: 1975-11-22  Today's Date: 11/27/2022 OT Individual Time: 6578-4696 OT Individual Time Calculation (min): 72 min    Short Term Goals: Week 2:  OT Short Term Goal 1 (Week 2): Patient will visually attend to self in mirror x 3-5 seconds with max cueing as prerequisite to grooming tasks OT Short Term Goal 2 (Week 2): Patient will participate following hand over hand cueing in familiar functional task - e.g. brushing hair, brushing teeth, washing body with RUE and mod assist OT Short Term Goal 3 (Week 2): Patient will sit unsupported x 5 seconds in preparation for level surface transfer OT Short Term Goal 4 (Week 2): Patient will demonstrate localized response (looking toward)to auditory stimulus (name being called) 3 of 5 trials from supported position. OT Short Term Goal 5 (Week 2): Patient will demonstrate improved passive or active assisted elbow range of motion in preparation for assistance with BADL.  Right -5* extension, Left - 15* extension   Skilled Therapeutic Interventions/Progress Updates:    Pt received sitting up in TIS with her mother present.  Mother stated that she ate a good lunch and didn't seem to be in any pain.  Pt was visibly scratching at her scalp and mother expressed that she may need her hair washed.  Hair washing at sink level completed with total A use hair washing tray.  Prolonged PROM of neck into extension for hair washing. Pt initially grimacing but however became noticeably more relaxed with continued stretching. Oral care completed Max A with multimodal cuing and Max HOH for initiation.  Motor perseveration present in R UE but pt able to 25 % of teeth brushing.  Pt escorted to main gym in wheelchair.  Slide board transfer to mat was being initiated when OTS noticed that pts brief was soiled.  Pt taken back to room for peri-care at bed level.  Total A stand pivot from  bed to wheelchair.  Pt able to roll R<>L with Total A.  Pericare completed with Total A at bed level.  Dressing on sacral wound was changed.  PROM for elbow extension, hip flexion and ankle dorsiflexion completed to maintain flexibility and promote neutral joint alignment and prevent contracture risk.  Pt left lying supine in bed with HOB raised to 30 degrees. Bed alarm set, call light within reach and all needs met. Therapy Documentation Precautions:  Precautions Precautions: Fall Precaution Comments: Contact precautions & delayed processing Restrictions Weight Bearing Restrictions: No      Therapy/Group: Individual Therapy  Liam Graham 11/27/2022, 7:44 AM

## 2022-11-27 NOTE — Progress Notes (Signed)
Speech Language Pathology Weekly Progress and Session Note  Patient Details  Name: Nicole [name] MRN: [id number] Date of Birth: 07/27/1975  Beginning of progress report period: November 20, 2022 End of progress report period: November 27, 2022  Today's Date: 11/27/2022 SLP Individual Time: 9528-4132 SLP Individual Time Calculation (min): 46 min  Short Term Goals: Week 1: SLP Short Term Goal 1 (Week 1): Patient will utilize multimodal communication to answer basic yes/no questions with Max A multimodal cues in 25% of opportunities SLP Short Term Goal 1 - Progress (Week 1): Not met SLP Short Term Goal 2 (Week 1): Patient will follow 1-step commands within a context in 25% of opportunities with Max A multimodal cues. SLP Short Term Goal 2 - Progress (Week 1): Not met SLP Short Term Goal 3 (Week 1): Patient will vocalize on command in 25% of opportunities with Max A multimodal cues. SLP Short Term Goal 3 - Progress (Week 1): Not met SLP Short Term Goal 4 (Week 1): Pt will present w/ appropriate response to functional stimuli (i.e. accept spoon, grasp basic item appropriately, response to tactile stimuli) SLP Short Term Goal 4 - Progress (Week 1): Not met    New Short Term Goals: Week 2: SLP Short Term Goal 1 (Week 2): Patient will utilize multimodal communication to answer basic yes/no questions with Max A multimodal cues in 25% of opportunities SLP Short Term Goal 2 (Week 2): Patient will follow 1-step commands within a context in 25% of opportunities with Max A multimodal cues. SLP Short Term Goal 3 (Week 2): Patient will vocalize on command in 25% of opportunities with Max A multimodal cues. SLP Short Term Goal 4 (Week 2): Pt will present w/ appropriate response to functional stimuli (i.e. accept spoon, grasp basic item appropriately, response to tactile stimuli)  Weekly Progress Updates: Pt has made limited gains and has not met any STG's this reporting period. She is tolerating a Dys 3 diet with  full supervision mostly due to feeding impairments and cognition. Pt presents with limited initiation of communication, verbal and nonverbal. She also presents with decreased ability to follow commands. Pt would benefit from continued skilled SLP intervention to maximize cognition and language in order to maximize her functional independence prior to discharge.   Intensity: Minumum of 1-2 x/day, 30 to 90 minutes Frequency: 3 to 5 out of 7 days Duration/Length of Stay: 2 weeks Treatment/Interventions: Cognitive remediation/compensation;Dysphagia/aspiration precaution training;Internal/external aids;Speech/Language facilitation;Cueing hierarchy;Environmental controls;Therapeutic Activities;Functional tasks;Multimodal communication approach;Patient/family education;Therapeutic Exercise;DME/adaptive equipment instruction   Daily Session  Skilled Therapeutic Interventions: Pt was seen in am to address cognitive re- training. Pt was asleep in TIS WC upon SLP arrival requiring verbal and tactile cues for alertness. Pt able to achieve eye contact with SLP ~ 5 seconds as SLP introduced herself. Throughout session, pt appeared responsive to her name being called c/b re alerting or looking toward stimulus. SLP played country music with pt observed to nod her head forward as if bobbing to music. Pt shook her head yes when asked if she liked this song. SLP initiated functional task of oral hygiene. Using hand over hand assist SLP guided toothbrush into pt's oral cavity and began brushing motion. SLP's hands removed and pt able to continue brushing motion for ~7 seconds. SLP took over and provided thorough oral hygiene. Pt opened her mouth x2  given verbal cue, "open." SLP also challenged pt in brushing her hair. Pt observed to scratch the right side of her head for prolonged lengths of time.  With hairbrush placed in her hand and verbal and tactile prompting to complete task with no follow through and attempting to  continue scratching her head with her fingers. Wash cloth also presented to pt with verbal and tactile prompt to initiate face wash and then hand over hand assist with no return demo. Pt observed to fall asleep during session with inability to re- arouse. Thus , pt missed 10 minutes of skilled intervention. Pt was left upright in WC with call button within reach. SLP to continue POC.     General    Pain Pain Assessment Pain Scale: Faces Pain Score: 0-No pain  Therapy/Group: Individual Therapy  [name] 11/27/2022, 4:14 PM

## 2022-11-27 NOTE — Progress Notes (Signed)
Patient ID: Nicole [name], female   DOB: 1976/03/22, 47 y.o.   MRN: [id number]  Met with Mom who is present who has questions regarding my chart and how to access it. Have asked Deborah-Care Coordinator to address with her. Mom to go ahead and pursue guardianship since pt not progressing in therapies. Will get letter for Mom regarding pt's inability to manage her own affairs and needed appointed guardian. Working on passar level 2 for pt currently

## 2022-11-27 NOTE — Progress Notes (Signed)
Physical Therapy Session Note  Patient Details  Name: Nicole [name] MRN: [id number] Date of Birth: 04/18/1976  Today's Date: 11/27/2022 PT Individual Time: 0820-0945 PT Individual Time Calculation (min): 85 min   Short Term Goals: Week 2:  PT Short Term Goal 1 (Week 2): Pt will initiate bed mobility but require no more than totalA PT Short Term Goal 2 (Week 2): Pt will maintain sitting balance unsupported for 30 seconds PT Short Term Goal 3 (Week 2): Pt will demonstrate command follow for ~15% of tasks PT Short Term Goal 4 (Week 2): Pt will be assessed for custom wheelchair needs  Skilled Therapeutic Interventions/Progress Updates:      Pt in bed, nonverbal and no signs of pain. Donned thigh-high TED's dependent assist.  Supine<>sitting EOB dependently and pt requiring totalA for squat<>pivot transfer into TIS w/c.  Transported patient outdoors for change of environment for increase stimulation. While outdoors, tried to get her to visual scan, attend to sounds and colors, but patient kept her eyes closed with chin tucked in severe cervical flexion. No improvement in alertness with change in stimulation.  Returned upstairs to [employer] floor. Messaged care team regarding DC planning - options for hospice vs SNF, anticipated long term SNF placement.  +2 dependent lift from TIS w/c to tilt table. Repositioning as needed. Used mirror for visual feedback during tilting. Patient tolerated ~50deg up to 75deg of tilt on the table. B heels able to get flat on footboard. Assist for cervical extension due to weakness and flexor tightness. Ranged cervical rotation and lateral flexion to tolerance passively. Also ranged B arm extension passively. She appears to have improved cervical extension compared to prior sessions but difficult to subjectively say.   Returned to [facility] w/c via +3 assist and then returned to her room. Ended session reclined in TIS w/c with full lap tray and B moon boots supporting  ankles/feet. Pillows provided for positioning and to protect bony prominences for skin integrity.   Therapy Documentation Precautions:  Precautions Precautions: Fall Precaution Comments: Contact precautions & delayed processing Restrictions Weight Bearing Restrictions: No General:      Therapy/Group: Individual Therapy  Xinyi Batton P Breeonna Mone  PT, DPT, CSRS  11/27/2022, 7:47 AM

## 2022-11-27 NOTE — Progress Notes (Signed)
PROGRESS NOTE   Subjective/Complaints: Patient is more fatigued today Eating 100% of meals and drinking SW is looking for nursing home Discussed home with hospice with palliative care but she is not a candidate  ROS: Limited due to cognitive/behavioral     Objective:   No results found. No results for input(s): "WBC", "HGB", "HCT", "PLT" in the last 72 hours.  No results for input(s): "NA", "K", "CL", "CO2", "GLUCOSE", "BUN", "CREATININE", "CALCIUM" in the last 72 hours.   Intake/Output Summary (Last 24 hours) at 11/27/2022 0939 Last data filed at 11/27/2022 4098 Gross per 24 hour  Intake 474 ml  Output --  Net 474 ml      Pressure Injury 10/31/22 Buttocks Left Unstageable - Full thickness tissue loss in which the base of the injury is covered by slough (yellow, tan, gray, green or brown) and/or eschar (tan, brown or black) in the wound bed. (Active)  10/31/22 2000  Location: Buttocks  Location Orientation: Left  Staging: Unstageable - Full thickness tissue loss in which the base of the injury is covered by slough (yellow, tan, gray, green or brown) and/or eschar (tan, brown or black) in the wound bed.  Wound Description (Comments):   Present on Admission: Yes     Pressure Injury 11/18/22 Toe (Comment  which one) Anterior;Right Deep Tissue Pressure Injury - Purple or maroon localized area of discolored intact skin or blood-filled blister due to damage of underlying soft tissue from pressure and/or shear. small are (Active)  11/18/22 1300  Location: Toe (Comment  which one)  Location Orientation: Anterior;Right  Staging: Deep Tissue Pressure Injury - Purple or maroon localized area of discolored intact skin or blood-filled blister due to damage of underlying soft tissue from pressure and/or shear.  Wound Description (Comments): small area on right great toe metatarsal  Present on Admission: Yes     Pressure Injury  11/18/22 Toe (Comment  which one) Anterior;Right Stage 1 -  Intact skin with non-blanchable redness of a localized area usually over a bony prominence. small pink area of non blanchable skin on Right pinky toe (Active)  11/18/22 1900  Location: Toe (Comment  which one)  Location Orientation: Anterior;Right  Staging: Stage 1 -  Intact skin with non-blanchable redness of a localized area usually over a bony prominence.  Wound Description (Comments): small pink area of non blanchable skin on Right pinky toe  Present on Admission: Yes     Pressure Injury 11/22/22 Toe (Comment  which one) Anterior;Right Stage 2 -  Partial thickness loss of dermis presenting as a shallow open injury with a red, pink wound bed without slough. small scratch on dorsal aspect of foot with dark purple brusing ar (Active)  11/22/22 1930  Location: Toe (Comment  which one)  Location Orientation: Anterior;Right  Staging: Stage 2 -  Partial thickness loss of dermis presenting as a shallow open injury with a red, pink wound bed without slough.  Wound Description (Comments): small scratch on dorsal aspect of foot with dark purple brusing around the base of the foot possible medical device related to Ted hose  Present on Admission: No    Physical Exam: Vital Signs Blood pressure 102/74,  pulse 87, temperature 97.9 F (36.6 C), temperature source Oral, resp. rate 18, height 5\' 1"  (1.549 m), SpO2 99%.  Gen: no distress, normal appearing HEENT: oral mucosa pink and moist, NCAT Cardio: Reg rate Chest: normal effort, normal rate of breathing Abd: soft, non-distended Ext: no edema Psych: pleasant, normal affect  Skin: No evidence of breakdown, no evidence of rash Neurologic: pt still barely responds to verbal cues, makes some eye contact but still inconsistent.,  increased tone and no movement to command when testing proximal UE  0/5 Bilateral , hip flexor, knee extensors, ankle dorsiflexor and plantar flexor Sensory exam  unable to assess other than grimace to pinch of bilateral fingers and toes  Increased elbow flexor tone MAS3 , MAS 3 at pecs , LE tone reduced MAS 0  Cerebellar exam unable to assess  Musculoskeletal: reduced bilateral shoulder and elbow ROM due to increased tone    Assessment/Plan: 1. Functional deficits which require 3+ hours per day of interdisciplinary therapy in a comprehensive inpatient rehab setting. Physiatrist is providing close team supervision and 24 hour management of active medical problems listed below. Physiatrist and rehab team continue to assess barriers to discharge/monitor patient progress toward functional and medical goals  Care Tool:  Bathing        Body parts bathed by helper: Right arm, Left arm, Chest, Abdomen, Front perineal area, Buttocks, Right upper leg, Left upper leg, Right lower leg, Left lower leg, Face     Bathing assist Assist Level: Dependent - Patient 0%     Upper Body Dressing/Undressing Upper body dressing   What is the patient wearing?: Pull over shirt    Upper body assist Assist Level: Dependent - Patient 0%    Lower Body Dressing/Undressing Lower body dressing      What is the patient wearing?: Pants     Lower body assist Assist for lower body dressing: Dependent - Patient 0%     Toileting Toileting Toileting Activity did not occur (Clothing management and hygiene only): N/A (no void or bm)  Toileting assist Assist for toileting: 2 Helpers     Transfers Chair/bed transfer  Transfers assist  Chair/bed transfer activity did not occur: Safety/medical concerns  Chair/bed transfer assist level: Total Assistance - Patient < 25%     Locomotion Ambulation   Ambulation assist   Ambulation activity did not occur: Safety/medical concerns (attempted with 3 musketteers and +2 for WC follow but not safe)          Walk 10 feet activity   Assist  Walk 10 feet activity did not occur: Safety/medical concerns         Walk 50 feet activity   Assist Walk 50 feet with 2 turns activity did not occur: Safety/medical concerns         Walk 150 feet activity   Assist Walk 150 feet activity did not occur: Safety/medical concerns         Walk 10 feet on uneven surface  activity   Assist Walk 10 feet on uneven surfaces activity did not occur: Safety/medical concerns         Wheelchair     Assist Is the patient using a wheelchair?: Yes Type of Wheelchair: Manual    Wheelchair assist level: Dependent - Patient 0%      Wheelchair 50 feet with 2 turns activity    Assist    Wheelchair 50 feet with 2 turns activity did not occur: Safety/medical concerns       Wheelchair  150 feet activity     Assist  Wheelchair 150 feet activity did not occur: Safety/medical concerns       Blood pressure 102/74, pulse 87, temperature 97.9 F (36.6 C), temperature source Oral, resp. rate 18, height 5\' 1"  (1.549 m), SpO2 99%.  Medical Problem List and Plan: 1. 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 Extubated 11/05/2022 Fluctuating level of alertness             -patient may  shower             -ELOS/Goals: min/modA but prognosis is guarded due to severe cognitive deficits goals 28-30d  -Continue CIR therapies including PT, OT, and SLP    Craig bed ordered  Mother updated  Palliative care consulted, discussed home with hospice but patient is not a candidate  MRI brain ordered given decline in speech-->no acute abnormality evident   2.  Impaired mobility: continue Lovenox             -antiplatelet therapy: N/A 3. Pain Management: Tylenol as needed 4. Mood/Behavior/Sleep: Provide emotional support             -antipsychotic agents: N/A 5. Neuropsych/cognition: This patient is not capable of making decisions on her own behalf. 6. Left buttock unstageable ulcer.  Continue Medihoney to buttock wound daily cover with foam  dressing.  Changing dressing every 3 days or as needed soiling  7. Fluids/Electrolytes/Nutrition: Routine in and outs with follow-up chemistries  8.  Cavitary right lower lobe pneumonia likely aspiration pneumonia/MRSA pneumonia.  Continue course of Zyvox as indicated per infectious disease  9.  History of drug abuse.  Positive cocaine on urine drug screen.  Provide counseling  10.  AKI/hypovolemia and ATN.  Renal function much improved.  Repeat Cr today.    Latest Ref Rng & Units 11/24/2022    6:25 AM 11/15/2022    4:23 AM 11/14/2022    4:09 AM  BMP  Glucose 70 - 99 mg/dL  93  99   BUN 6 - 20 mg/dL  17  15   Creatinine 1.61 - 1.00 mg/dL 0.96  0.45  4.09   Sodium 135 - 145 mmol/L  136  134   Potassium 3.5 - 5.1 mmol/L  3.9  4.1   Chloride 98 - 111 mmol/L  102  102   CO2 22 - 32 mmol/L  24  23   Calcium 8.9 - 10.3 mg/dL  8.5  8.3     11.  Mild transaminitis with rhabdomyolysis.  CK reviewed and normalized  12.  Hypotension.  Improving, decrease ProAmatine to 5mg  daily  13. Impaired initiation: continue amantadine to 100mg  BID--still minimal   7/14 had hoped to add ritalin but it's contraindicated with Zyvox (MAOI). Can't use provigil either.  -will bump amantadine to 200mg  bid, continue  14. Suboptimal vitamin D: continue 1,000U D3 daily  15. Spasticity: increase baclofen to 10mg  TID, BP reviewed and stable with this increase  16. Bowel and bladder incontinence: bowel and bladder program    LOS: 9 days A FACE TO FACE EVALUATION WAS PERFORMED  Drema Pry Laquida Cotrell 11/27/2022, 9:39 AM

## 2022-11-27 NOTE — Progress Notes (Signed)
Physical Therapy Session Note  Patient Details  Name: Nicole [name] MRN: [id number] Date of Birth: 11-02-1975  Today's Date: 11/27/2022 PT Individual Time: 1505-1530 PT Individual Time Calculation (min): 25 min   Short Term Goals: Week 2:  PT Short Term Goal 1 (Week 2): Pt will initiate bed mobility but require no more than totalA PT Short Term Goal 2 (Week 2): Pt will maintain sitting balance unsupported for 30 seconds PT Short Term Goal 3 (Week 2): Pt will demonstrate command follow for ~15% of tasks PT Short Term Goal 4 (Week 2): Pt will be assessed for custom wheelchair needs  Skilled Therapeutic Interventions/Progress Updates:      Pt in bed to start - remains nonverbal and presents with behaviors consistent with [place]. Focused session on PROM and repositioning in bed. Dependent assist for all mobility in bed, using bed linen to assist with managing trunk/hips. Patient positioned in sidelying for turning and to offload sacral pressure. Able to stay in sidelying position without assist once stacked. Able to stretch hip flexors in this position and then worked on prone positioning in bed, using Prone Pillow to support her head. Patient able to tolerate for only a few seconds before appearing quite uncomfortable, and would benefit from having +2 assist for this technique. Patient concluded session flat in bed with B moon boots on, R mit restraint on, and all needs met.   Therapy Documentation Precautions:  Precautions Precautions: Fall Precaution Comments: Contact precautions & delayed processing Restrictions Weight Bearing Restrictions: No General:      Therapy/Group: Individual Therapy  Orrin Brigham 11/27/2022, 3:39 PM

## 2022-11-27 NOTE — Discharge Summary (Shared)
Physician Discharge Summary  Patient ID: Nicole [name] MRN: [id number] DOB/AGE: 12-24-1975 47 y.o.  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 EXAM: MRI HEAD WITHOUT CONTRAST TECHNIQUE: Multiplanar, multiecho pulse sequences of the brain and surrounding structures were obtained without intravenous contrast. COMPARISON:  MRI brain 10/31/2022.  Head CT 11/15/2022. FINDINGS: Brain: No acute infarct or hemorrhage. Continued evolution of cerebral white matter signal abnormalities, greatest in the periventricular white matter and bilateral globi pallidi, suggestive of prior toxic/hypoxic insult, such as carbon monoxide poisoning. Sulcal susceptibility in the posterior frontal lobes, likely sequela of prior subarachnoid hemorrhage. No hydrocephalus or extra-axial collection. No mass or midline shift. Vascular: Normal flow voids. Skull and upper cervical spine: Normal marrow signal. Sinuses/Orbits: Unremarkable. Other: None. IMPRESSION: 1. No acute intracranial process. 2. Continued evolution of cerebral white matter signal abnormalities, greatest in the periventricular white matter and bilateral globi pallidi, suggestive of prior toxic/hypoxic insult, such as carbon monoxide poisoning. Electronically Signed   By: Orvan Falconer M.D.   On: 11/24/2022 10:56   CT HEAD WO CONTRAST ( )  Result Date: 11/15/2022 CLINICAL DATA:  Mental status change with unknown cause EXAM: CT HEAD WITHOUT CONTRAST TECHNIQUE: Contiguous axial images were obtained from the base of the skull through the vertex  without intravenous contrast. RADIATION DOSE REDUCTION: This exam was performed according to the departmental dose-optimization program which includes automated exposure control, adjustment of the mA and/or kV according to patient size and/or use of iterative reconstruction technique. COMPARISON:  Brain MRI 10/31/2022 FINDINGS: Brain: Cerebral and globus pallidus insults on comparison brain MRI are essentially occult. No evidence of acute infarction, hemorrhage, hydrocephalus, or mass. Vascular: No hyperdense vessel or unexpected calcification. Skull: Normal. Negative for fracture or focal lesion. Sinuses/Orbits: No acute finding. IMPRESSION: Abnormalities on recent brain MRI are largely occult. No evidence of acute or interval insult. Electronically Signed   By: Tiburcio Pea M.D.   On: 11/15/2022 07:14   DG CHEST PORT 1 VIEW  Result Date: 11/14/2022 CLINICAL DATA:  Shortness of breath. EXAM: PORTABLE CHEST 1 VIEW COMPARISON:  Chest radiograph dated 11/03/2022 and CT dated 11/04/2022. FINDINGS: Right-sided PICC with tip at the cavoatrial junction. There is trace right pleural effusion and minimal right lung base atelectasis or infiltrate. Overall significant interval improvement of right lung base density since the prior radiograph. The left lung is clear. No pneumothorax. The cardiac silhouette is within normal limits. No acute osseous pathology. IMPRESSION: Trace right pleural effusion and minimal right lung base atelectasis or infiltrate. Electronically Signed   By: Elgie Collard M.D.   On: 11/14/2022 18:09   EEG adult  Result Date: 11/10/2022 Charlsie Quest, MD     11/10/2022  7:01 AM Patient Name: Nicole [name] MRN: [id number] Epilepsy Attending: Charlsie Quest Referring Physician/Provider: Rolly Salter, MD Date: 11/09/2022 Duration: 25 mins Patient history:  47 y.o. female with remote history of polysubstance use who presented after being found down and unresponsive by a bystander. EEG to  evaluate for seizure.  Level of alertness: Awake AEDs during EEG study: None Technical aspects: This EEG study was done with scalp electrodes positioned according to the 10-20  International system of electrode placement. Electrical activity was reviewed with band pass filter of 1-70Hz , sensitivity of 7 uV/mm, display speed of 43mm/sec with a 60Hz  notched filter applied as appropriate. EEG data were recorded continuously and digitally stored.  Video monitoring was available and reviewed as appropriate. Description: The posterior dominant rhythm consists of 7 Hz activity of moderate voltage (25-35 uV) seen predominantly in posterior head regions, symmetric and reactive to eye opening and eye closing. EEG showed continuous generalized 5 to 7 Hz theta slowing. On 11/09/2022 at 1654, patient was noted to have episodes of right hand jerking. Concomitant EEG before, during and after the event did not show any EEG changes suggest seizure. Hyperventilation and photic stimulation were not performed.   ABNORMALITY - Continuous slow, generalized - Background slow IMPRESSION: This study is suggestive of moderate diffuse encephalopathy, nonspecific etiology. No seizures or epileptiform discharges were seen throughout the recording. Charlsie Quest   ECHOCARDIOGRAM COMPLETE  Result Date: 11/06/2022    ECHOCARDIOGRAM REPORT   Patient Name:   Nicole [name] Ciszewski Date of Exam: 11/06/2022 Medical Rec #:  [id number]   Height:       61.0 in Accession #:    [id number]  Weight:       111.6 lb Date of Birth:  1975-07-03   BSA:          1.474 m Patient Age:    47 years    BP:           90/53 mmHg Patient Gender: F           HR:           70 bpm. Exam Location:  Inpatient Procedure: 2D Echo, Pediatric Echo, Color Doppler and Intracardiac Opacification            Agent Indications:    Dyspnea R06.00  History:        Patient has no prior history of Echocardiogram examinations.                 Signs/Symptoms:Dyspnea. Acute respiratory failure,  Cavitary                 pneumonia, Substance abuse.  Sonographer:    Aron Baba Referring Phys: [id number] [facility]  Sonographer Comments: Technically difficult study due to poor echo windows. IMPRESSIONS  1. Left ventricular ejection fraction, by estimation, is 60 to 65%. The left ventricle has normal function. The left ventricle has no regional wall motion abnormalities. Left ventricular diastolic parameters were normal.  2. Right ventricular systolic function is normal. The right ventricular size is normal. There is normal pulmonary artery systolic pressure.  3. The mitral valve is normal in structure. Trivial mitral valve regurgitation. No evidence of mitral stenosis.  4. The aortic valve is tricuspid. There is mild calcification of the aortic valve. Aortic valve regurgitation is not visualized. No aortic stenosis is present.  5. The inferior vena cava is normal in size with greater than 50% respiratory variability, suggesting right atrial pressure of 3 mmHg. Conclusion(s)/Recommendation(s): Limited echo. Valves not seen well. If concern for endocarditis exists would recommend TEE to further evaluate. FINDINGS  Left Ventricle: Left ventricular ejection fraction, by estimation, is 60 to 65%. The left ventricle has normal function. The left ventricle has no regional wall motion abnormalities. The left ventricular internal cavity size was normal in size. There is  no left ventricular hypertrophy. Left ventricular diastolic parameters were normal. Right Ventricle: The right ventricular size is normal. No increase  in right ventricular wall thickness. Right ventricular systolic function is normal. There is normal pulmonary artery systolic pressure. The tricuspid regurgitant velocity is 1.24 m/s, and  with an assumed right atrial pressure of 8 mmHg, the estimated right ventricular systolic pressure is 14.2 mmHg. Left Atrium: Left atrial size was normal in size. Right Atrium: Right atrial size was normal in  size. Pericardium: There is no evidence of pericardial effusion. Mitral Valve: The mitral valve is normal in structure. Trivial mitral valve regurgitation. No evidence of mitral valve stenosis. Tricuspid Valve: The tricuspid valve is normal in structure. Tricuspid valve regurgitation is mild . No evidence of tricuspid stenosis. Aortic Valve: The aortic valve is tricuspid. There is mild calcification of the aortic valve. Aortic valve regurgitation is not visualized. No aortic stenosis is present. Pulmonic Valve: The pulmonic valve was normal in structure. Pulmonic valve regurgitation is not visualized. No evidence of pulmonic stenosis. Aorta: The aortic root is normal in size and structure. Venous: The inferior vena cava is normal in size with greater than 50% respiratory variability, suggesting right atrial pressure of 3 mmHg. IAS/Shunts: No atrial level shunt detected by color flow Doppler.  LEFT VENTRICLE PLAX 2D LVIDd:         4.50 cm   Diastology LVIDs:         3.00 cm   LV e' medial:    11.70 cm/s LV PW:         0.80 cm   LV E/e' medial:  7.0 LV IVS:        0.40 cm   LV e' lateral:   13.40 cm/s LVOT diam:     1.60 cm   LV E/e' lateral: 6.1 LV SV:         34 LV SV Index:   23 LVOT Area:     2.01 cm  RIGHT VENTRICLE RV S prime:     17.40 cm/s TAPSE (M-mode): 1.2 cm LEFT ATRIUM             Index        RIGHT ATRIUM          Index LA diam:        2.50 cm 1.70 cm/m   RA Area:     9.34 cm LA Vol (A2C):   26.8 ml 18.19 ml/m  RA Volume:   20.30 ml 13.77 ml/m LA Vol (A4C):   28.9 ml 19.61 ml/m LA Biplane Vol: 30.6 ml 20.76 ml/m  AORTIC VALVE LVOT Vmax:   103.00 cm/s LVOT Vmean:  64.500 cm/s LVOT VTI:    0.171 m  AORTA Ao Root diam: 3.10 cm Ao Asc diam:  2.90 cm MITRAL VALVE               TRICUSPID VALVE MV Area (PHT): 3.46 cm    TR Peak grad:   6.2 mmHg MV Decel Time: 219 msec    TR Vmax:        124.00 cm/s MR Peak grad: 7.3 mmHg MR Vmax:      135.00 cm/s  SHUNTS MV E velocity: 82.30 cm/s  Systemic VTI:  0.17 m  MV A velocity: 67.60 cm/s  Systemic Diam: 1.60 cm MV E/A ratio:  1.22 Arvilla Meres MD Electronically signed by Arvilla Meres MD Signature Date/Time: 11/06/2022/5:20:21 PM    Final    [place] EKG SITE RITE  Result Date: 11/04/2022 If Site Rite image not attached, placement could not be confirmed due to current cardiac rhythm.  CT CHEST  WO CONTRAST  Result Date: 11/04/2022 CLINICAL DATA:  Pneumonia. EXAM: CT CHEST WITHOUT CONTRAST TECHNIQUE: Multidetector CT imaging of the chest was performed following the standard protocol without IV contrast. RADIATION DOSE REDUCTION: This exam was performed according to the departmental dose-optimization program which includes automated exposure control, adjustment of the mA and/or kV according to patient size and/or use of iterative reconstruction technique. COMPARISON:  10/31/2022 FINDINGS: Cardiovascular: The heart size is normal. No substantial pericardial effusion. Right IJ central line tip projects at the mid to distal SVC level. NG tube extends into the stomach. Endotracheal tube tip is in the distal trachea. Mediastinum/Nodes: No mediastinal lymphadenopathy. No evidence for gross hilar lymphadenopathy although assessment is limited by the lack of intravenous contrast on the current study. The esophagus has normal imaging features. There is no axillary lymphadenopathy. Lungs/Pleura: 6 mm right middle lobe pulmonary nodule is similar to prior. Right lower lobe consolidative opacity again noted with progression of cavitary component now measuring 3.2 cm with layering fluid level. Patchy nodular left lower lobe airspace disease is similar with progression of confluent component in the paraspinal base. Fine architectural detail of lung parenchyma obscured by breathing motion. Trace bilateral pleural effusions evident. Upper Abdomen: Unremarkable. Musculoskeletal: No worrisome lytic or sclerotic osseous abnormality. IMPRESSION: 1. Interval progression of right lower lobe  consolidative opacity with progression of cavitary component now measuring 3.2 cm with layering fluid level. Imaging features compatible with cavitary pneumonia. 2. Patchy nodular left lower lobe airspace disease is similar to prior with progression of confluent component in the paraspinal base. Imaging features compatible with pneumonia. 3. Trace bilateral pleural effusions. 4. 6 mm right middle lobe pulmonary nodule is similar to prior. Non-contrast chest CT at 6-12 months is recommended. If the nodule is stable at time of repeat CT, then future CT at 18-24 months (from today's scan) is considered optional for low-risk patients, but is recommended for high-risk patients. This recommendation follows the consensus statement: Guidelines for Management of Incidental Pulmonary Nodules Detected on CT Images: From the Fleischner Society 2017; Radiology 2017; 284:228-243. Electronically Signed   By: Kennith Center M.D.   On: 11/04/2022 12:37   DG CHEST PORT 1 VIEW  Result Date: 11/03/2022 CLINICAL DATA:  Follow-up pneumonia EXAM: PORTABLE CHEST 1 VIEW COMPARISON:  11/01/2022 FINDINGS: Cardiac shadow is stable. Endotracheal tube, gastric catheter and right jugular central line are again seen and stable. Small right pleural effusion and basilar atelectasis is seen stable from the prior exam. No new focal abnormality is noted. IMPRESSION: No change from the previous exam. Electronically Signed   By: Alcide Clever M.D.   On: 11/03/2022 10:40   DG Chest Port 1 View  Result Date: 11/01/2022 CLINICAL DATA:  Evaluate for pneumonia. EXAM: PORTABLE CHEST 1 VIEW COMPARISON:  10/31/2022 FINDINGS: ETT tip is stable above the carina. There is a right IJ catheter with tip at the superior cavoatrial junction. Enteric tube courses below the level of the GE junction. Small right pleural effusion. Increased from previous exam. Persistent airspace opacities within the right base concerning for pneumonia. Retrocardiac opacity in the  left base is also unchanged. IMPRESSION: 1. Small right pleural effusion. 2. Persistent bibasilar opacities concerning for pneumonia. 3. Stable support apparatus. Electronically Signed   By: Signa Kell M.D.   On: 11/01/2022 09:00   MR BRAIN WO CONTRAST  Result Date: 10/31/2022 CLINICAL DATA:  Possible hypoxic brain injury EXAM: MRI HEAD WITHOUT CONTRAST TECHNIQUE: Multiplanar, multiecho pulse sequences of the brain and surrounding structures  were obtained without intravenous contrast. COMPARISON:  Head CT 10/31/2022 FINDINGS: Brain: There are symmetric areas of hyperintense T2-weighted signal within the globi pallidi. There is multifocal abnormal diffusion restriction within a bilateral deep watershed distribution in the corona radiata. No hydrocephalus. Bilateral small volume subarachnoid hemorrhage. There is questionable hyperintense T2-weighted signal of the parietal cortex bilaterally on the FLAIR sequence. Vascular: Normal flow voids. Skull and upper cervical spine: Normal marrow signal. Sinuses/Orbits: Negative. Other: None. IMPRESSION: 1. Symmetric signal abnormality within the globi pallidi is most consistent with a toxic metabolic injury, such as carbon monoxide poisoning. 2. 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 3. Small amount of bifrontal subarachnoid hemorrhage, unchanged. Electronically Signed   By: Deatra Robinson M.D.   On: 10/31/2022 23:56   DG Chest Portable 1 View  Result Date: 10/31/2022 CLINICAL DATA:  Line placement EXAM: PORTABLE CHEST 1 VIEW COMPARISON:  X-ray 10/31/2022 and CT. FINDINGS: Hyperinflation. Persistent lung base opacities, right-greater-than-left. Trace right effusion. No pneumothorax or edema. Stable ET tube and enteric tube. New right IJ line with tip along the central SVC. IMPRESSION: New right IJ line.  No pneumothorax. Stable ET tube and enteric tube. Persistent lung base opacities, right-greater-than-left with a tiny right effusion.  Previous lucency along left hemithorax is not persist. No definite pneumothorax on the current x-ray. Electronically Signed   By: Karen Kays M.D.   On: 10/31/2022 18:00   CT CHEST ABDOMEN PELVIS WO CONTRAST  Result Date: 10/31/2022 CLINICAL DATA:  Found down EXAM: CT CHEST, ABDOMEN AND PELVIS WITHOUT CONTRAST TECHNIQUE: Multidetector CT imaging of the chest, abdomen and pelvis was performed following the standard protocol without IV contrast. RADIATION DOSE REDUCTION: This exam was performed according to the departmental dose-optimization program which includes automated exposure control, adjustment of the mA and/or kV according to patient size and/or use of iterative reconstruction technique. COMPARISON:  None Available. FINDINGS: CT CHEST FINDINGS Cardiovascular: Normal heart size. No esophagus and thyroid are unremarkable. Pericardial effusion. Normal caliber thoracic aorta with no atherosclerotic disease. Mediastinum/Nodes: Esophagus and thyroid are unremarkable. No enlarged lymph nodes seen in the chest. Lungs/Pleura: Central airways are patent. Debris seen in the right lower lobe segmental bronchi. Right-greater-than-left lower lobe consolidations. Focal areas of bubbly lucency seen within the right lower lobe consolidation. Numerous centrilobular nodules and ground-glass opacities which are most pronounced in the bilateral lower lobes and posterior portions of the right middle lobe and bilateral upper lobes. Trace right pleural effusion. Musculoskeletal: No chest wall mass or suspicious bone lesions identified. CT ABDOMEN PELVIS FINDINGS Streak artifact related to patient positioning somewhat limits evaluation. Hepatobiliary: No focal liver abnormality. Gallbladder is unremarkable. Pancreas: Visualized portions are unremarkable. Spleen: Normal in size without focal abnormality. Adrenals/Urinary Tract: Bilateral adrenal glands are unremarkable. No hydronephrosis or nephrolithiasis. Bladder is partially  decompressed and contains a Foley catheter. Stomach/Bowel: Enteric tube tip is in the distal stomach. Stomach is within normal limits. No evidence of bowel wall thickening, distention, or inflammatory changes. Vascular/Lymphatic: No significant vascular findings are present. No enlarged abdominal or pelvic lymph nodes. Reproductive: IUD in place.  No adnexal mass. Other: No abdominal wall hernia or abnormality. No abdominopelvic ascites. Musculoskeletal: Superior endplate lucency of L5, likely a Schmorl's node. No aggressive appearing osseous lesions. IMPRESSION: 1. Right-greater-than-left lower lobe consolidations with numerous associated centrilobular nodules and ground-glass opacities. Findings are most consistent with aspiration pneumonia. Focal areas of bubbly lucency are seen within the right lower lobe consolidation, which are concerning for cavitary infection or associated pulmonary  infarct. Consider chest CTA to exclude pulmonary embolus. 2. Trace right pleural effusion.  No pneumothorax. 3. Evaluation of the abdomen and pelvis is limited due to lack of IV contrast and streak artifact related to patient positioning. Within limitations, no acute abnormality of the abdomen or pelvis. Electronically Signed   By: Allegra Lai M.D.   On: 10/31/2022 17:11   CT HEAD WO CONTRAST  Result Date: 10/31/2022 CLINICAL DATA:  Head trauma, minor (Age >= 65y); ams abnormal edema within bilateral EXAM: CT HEAD WITHOUT CONTRAST CT CERVICAL SPINE WITHOUT CONTRAST TECHNIQUE: Multidetector CT imaging of the head and cervical spine was performed following the standard protocol without intravenous contrast. Multiplanar CT image reconstructions of the cervical spine were also generated. RADIATION DOSE REDUCTION: This exam was performed according to the departmental dose-optimization program which includes automated exposure control, adjustment of the mA and/or kV according to patient size and/or use of iterative  reconstruction technique. COMPARISON:  None Available. FINDINGS: CT HEAD FINDINGS Brain: Abnormal symmetric edema within bilateral globus palladi. No evidence of acute hemorrhage, mass lesion, midline shift or hydrocephalus. Trace/equivocal hyperdensity along the high bilateral frontal sulci. No mass occupying acute hemorrhage. No hydrocephalus or herniation. Vascular: No hyperdense vessel identified. Skull: No acute fracture. Sinuses/Orbits: Clear sinuses.  No acute orbital findings. Other: No mastoid effusions. CT CERVICAL SPINE FINDINGS Alignment: Rotation of C1 on C2. No substantial sagittal subluxation. Skull base and vertebrae: Vertebral body heights are maintained. No evidence of acute fracture Soft tissues and spinal canal: No prevertebral fluid or swelling. No visible canal hematoma. Disc levels: Mild to moderate multilevel bony degenerative change. Upper chest: Visualized lung apices are clear. IMPRESSION: 1. Abnormal symmetric edema within bilateral globus palladi concerning for acute hypoxic/ischemic or metabolic/toxic injury. Recommend MRI head to further assess. 2. Trace/equivocal hyperdensity along the high bilateral frontal sulci could be artifactual or trace subarachnoid hemorrhage. No mass effect. Recommend attention on the forthcoming MRI. If MRI is not obtained, recommend follow-up CT to ensure stability. 3. Rotation of C1 on C2, most likely positional in the absence of a fixed torticollis. 4. No evidence of acute fracture in the cervical spine. Electronically Signed   By: Feliberto Harts M.D.   On: 10/31/2022 17:00   CT Cervical Spine Wo Contrast  Result Date: 10/31/2022 CLINICAL DATA:  Head trauma, minor (Age >= 65y); ams abnormal edema within bilateral EXAM: CT HEAD WITHOUT CONTRAST CT CERVICAL SPINE WITHOUT CONTRAST TECHNIQUE: Multidetector CT imaging of the head and cervical spine was performed following the standard protocol without intravenous contrast. Multiplanar CT image  reconstructions of the cervical spine were also generated. RADIATION DOSE REDUCTION: This exam was performed according to the departmental dose-optimization program which includes automated exposure control, adjustment of the mA and/or kV according to patient size and/or use of iterative reconstruction technique. COMPARISON:  None Available. FINDINGS: CT HEAD FINDINGS Brain: Abnormal symmetric edema within bilateral globus palladi. No evidence of acute hemorrhage, mass lesion, midline shift or hydrocephalus. Trace/equivocal hyperdensity along the high bilateral frontal sulci. No mass occupying acute hemorrhage. No hydrocephalus or herniation. Vascular: No hyperdense vessel identified. Skull: No acute fracture. Sinuses/Orbits: Clear sinuses.  No acute orbital findings. Other: No mastoid effusions. CT CERVICAL SPINE FINDINGS Alignment: Rotation of C1 on C2. No substantial sagittal subluxation. Skull base and vertebrae: Vertebral body heights are maintained. No evidence of acute fracture Soft tissues and spinal canal: No prevertebral fluid or swelling. No visible canal hematoma. Disc levels: Mild to moderate multilevel bony degenerative change. Upper chest:  Visualized lung apices are clear. IMPRESSION: 1. Abnormal symmetric edema within bilateral globus palladi concerning for acute hypoxic/ischemic or metabolic/toxic injury. Recommend MRI head to further assess. 2. Trace/equivocal hyperdensity along the high bilateral frontal sulci could be artifactual or trace subarachnoid hemorrhage. No mass effect. Recommend attention on the forthcoming MRI. If MRI is not obtained, recommend follow-up CT to ensure stability. 3. Rotation of C1 on C2, most likely positional in the absence of a fixed torticollis. 4. No evidence of acute fracture in the cervical spine. Electronically Signed   By: Feliberto Harts M.D.   On: 10/31/2022 17:00   DG Chest Port 1 View  Result Date: 10/31/2022 CLINICAL DATA:  Post intubation EXAM:  PORTABLE CHEST 1 VIEW COMPARISON:  None Available. FINDINGS: ET tube in place with the tip 3 cm above the carina. Enteric tube extends beneath the diaphragm. Mild opacity along the medial right lower lobe. Minimal medial left lower lobe. Left-sided apical pneumothorax suspected. No effusion or edema. IMPRESSION: ET tube and enteric tube in place. Small left apical pneumothorax. Recommend further workup such as chest CT. Lung base opacities, right-greater-than-left. Critical Value/emergent results were called by telephone at the time of interpretation on 10/31/2022 at 12:55 pm to provider Dr. Gayleen Orem, who verbally acknowledged these results. Electronically Signed   By: Karen Kays M.D.   On: 10/31/2022 16:07    Labs:  Basic Metabolic Panel: Recent Labs  Lab 11/24/22 0625  CREATININE 0.79    CBC: Recent Labs  Lab 11/24/22 0625  WBC 5.5  HGB 12.9  HCT 38.5  MCV 97.0  PLT 249    CBG: No results for input(s): "GLUCAP" in the last 168 hours.  Family history.  Father with leukemia.  Denies any colon cancer esophageal cancer or rectal cancer  Brief HPI:   Nicole [name] is a 47 y.o. right-handed female with history of asthma as well as anxiety depression with drug abuse.  Per chart review lives with her mother.  Reportedly independent prior to admission.  Presented 10/31/2022 after being found down in a field beside a condemned building.  Patient unresponsive on arrival to the ED, hypoxic requiring nonrebreather mask.  Required intubation for airway protection.  Cranial CT scan showed abnormal symmetric edema with some bilateral globus paladi concerning for acute hypoxic ischemic or metabolic toxic injury.  No mass effect.  CT cervical spine negative.  MRI of the brain consistent with toxic metabolic injury.  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  Small amount of bifrontal subarachnoid hemorrhage.  EEG negative for seizure.  CT of the chest abdomen pelvis showed right greater  than left lower lobe consolidation with numerous associated central lobar nodules and groundglass opacities.  Trace right pleural effusion no pneumothorax.  Admission chemistries unremarkable except sodium 161 chloride 124 CO2 21 BUN 79 creatinine 2.57 AST 102 ALT 457 total bilirubin 2.2 WBC 43,600 lactic acid 4.3 BNP 140 urine drug screen positive cocaine blood cultures no growth to date.  Neurosurgery consulted for diffuse cerebral edema in the setting of suspected acute hypoxic brain injury no surgical intervention recommended.  Infectious disease consulted regards to cavitary pneumonia initially placed on broad-spectrum antibiotics transitioned to Zyvox.  Leukocytosis continued to improve 10,900.  Lovenox added for DVT prophylaxis.  Echocardiogram showed no valvular vegetation and ejection fraction of 60 to 65%.  TEE not recommended at this time.  Patient was extubated 6/23.  Therapy evaluations completed due to patient's decreased functional mobility altered mental status was admitted for  a comprehensive rehab program.   Hospital Course: Nicole [name] was admitted to rehab 11/18/2022 for inpatient therapies to consist of PT, ST and OT at least three hours five days a week. Past admission physiatrist, therapy team and rehab RN have worked together to provide customized collaborative inpatient rehab.  Pertaining to patient's acute hypoxic brain injury/bilateral corona radiata watershed infarcts.  Evaluated by neurosurgery no surgical intervention.  Patient was extubated 11/05/2022.  Maintain on Lovenox for DVT prophylaxis.  Latest MRI follow-up 11/24/2022 showed no acute intracranial process.  Mood stabilization with the addition of amantadine.  Bouts of orthostasis she was weaned from [employer].  Cavitary right lower lobe pneumonia likely aspiration pneumonia MRSA pneumonia completed course of Zyvox remaining afebrile.  She did have a history of drug abuse positive cocaine on urine drug screen patient family  receiving counts regards to cessation of illicit drug products.  Left buttock unstageable ulcer with Medihoney to buttock wound covered daily with foam dressing and monitored.  AKI/hypovolemia and HTN improved latest creatinine 0.79.  Mild transaminitis with rhabdomyolysis CK reviewed and normalized.   Blood pressures were monitored on TID basis and soft and monitore    Rehab course: During patient's stay in rehab weekly team conferences were held to monitor patient's progress, set goals and discuss barriers to discharge. At admission, patient required total assist stand pivot transfers total assist step pivot transfers  Physical exam.  Blood pressure 88/59 pulse 94 temperature 97.1 respirations 18 oxygen saturation is 94% room air Constitutional.  Patient lethargic but arousable makes eye contact with examiner.  Followed inconsistent simple commands HEENT Head.  Normocephalic and atraumatic Eyes.  Pupils round and reactive to light no discharge without nystagmus Neck.  Supple nontender no JVD without thyromegaly Cardiac regular rate and rhythm without any extra sounds or murmur heard Abdomen.  Soft nontender positive bowel sounds without rebound Respiratory effort normal no respiratory distress without wheeze Neurologic.  Motor strength difficult to assess due to cognition  He/She  has had improvement in activity tolerance, balance, postural control as well as ability to compensate for deficits. He/She has had improvement in functional use RUE/LUE  and RLE/LLE as well as improvement in awareness.  Supine to sitting edge of bed with dependent assist.  Dependent squat pivot transfers from edge of bed to wheelchair +1 assist.  Patient assisted into supine position with dependent assist in place to assist and position into prone.  Total assist for ADLs.  Speech therapy follow-up attempted to complete hand overhand assist to complete object manipulation following direction to task through upper  extremity tone negatively impacted her ability to participate in hand overhand task.  She responded to music via eye gaze.  Ongoing family teaching awaiting plan for home versus skilled nursing facility       Disposition:  There are no questions and answers to display.         Diet: Soft  Special Instructions: No smoking or alcohol  Medications at discharge 1.  Tylenol as needed 2.  Amantadine 200 mg p.o. twice daily 3.  Baclofen 10 mg p.o. 3 times daily 4.  Vitamin D 1000 units p.o. daily 5.  ProAmatine 5 mg daily 6.  Multivitamin daily  30-35 minutes were spent completing discharge summary and discharge planning  Discharge Instructions     Ambulatory referral to Neurology   Complete by: As directed    An appointment is requested in approximately: 4 weeks hypoxic brain injury  Follow-up Information     Raulkar, Drema Pry, MD Follow up.   Specialty: Physical Medicine and Rehabilitation Why: Office to call for appointment Contact information: [address] [place] [address] [phone]         Dawley, Kendell Bane C, DO Follow up.   Why: Call for appointment Contact information: [address] [place] [address] [place] [address] [phone]         Odette Fraction, MD Follow up.   Specialty: Infectious Diseases Why: Call for appointment Contact information: [address] [place] [address] [phone]                 Signed: Charlton Amor 11/27/2022, 5:45 AM

## 2022-11-28 NOTE — Progress Notes (Signed)
Physical Therapy Session Note  Patient Details  Name: Nicole [name] MRN: [id number] Date of Birth: 06-20-1975  Today's Date: 11/28/2022 PT Individual Time: 0820-0900 PT Individual Time Calculation (min): 40 min   Short Term Goals: Week 2:  PT Short Term Goal 1 (Week 2): Pt will initiate bed mobility but require no more than totalA PT Short Term Goal 2 (Week 2): Pt will maintain sitting balance unsupported for 30 seconds PT Short Term Goal 3 (Week 2): Pt will demonstrate command follow for ~15% of tasks PT Short Term Goal 4 (Week 2): Pt will be assessed for custom wheelchair needs  Skilled Therapeutic Interventions/Progress Updates:      Pt in bed to start - finished with her breakfast from NT assistance. Removed B Pevalon boots as well as R mit restraint. Patient frequently picking scalp or neck with her R hand when free from mit so needed frequent blocking - gave her fidgity popper in RUE to help keep her hand busy. Pt has no signs of pain or discomfort while resting. Focused session on using KREG bed for tilting to promote upright, weight bearing through BLE, and stretching into extension.   Tilting in Kreg Bed up to 60  degrees. Once she reaches 50 deg of tilt, unable to hold her head up in extension due to weakness, thus needing assist for cervical extension when in 60 degrees. Assist needed also for positioning BLE. Once >50 deg, her feet begin to slide some so needs some repositioning with the greater the tilt.   Patient ended session in bed, positioned in L sidelying with pillows and reapplied B prevalon boots. All needs met at end.   Therapy Documentation Precautions:  Precautions Precautions: Fall Precaution Comments: Contact precautions & delayed processing Restrictions Weight Bearing Restrictions: No General:      Therapy/Group: Individual Therapy  Sanaai Doane P [place]  PT, DPT, CSRS  11/28/2022, 7:45 AM

## 2022-11-28 NOTE — Progress Notes (Signed)
Physical Therapy Session Note  Patient Details  Name: Nicole [name] MRN: [id number] Date of Birth: 1975/10/01  Today's Date: 11/28/2022 PT Individual Time: 1100-1200 PT Individual Time Calculation (min): 60 min   Short Term Goals: Week 2:  PT Short Term Goal 1 (Week 2): Pt will initiate bed mobility but require no more than totalA PT Short Term Goal 2 (Week 2): Pt will maintain sitting balance unsupported for 30 seconds PT Short Term Goal 3 (Week 2): Pt will demonstrate command follow for ~15% of tasks PT Short Term Goal 4 (Week 2): Pt will be assessed for custom wheelchair needs  Skilled Therapeutic Interventions/Progress Updates:      Pt in bed to start, noted to be incontinent of bladder - dependent level of care for brief change. Patient rigid in extension while rolling in bed, some protective responses noted with her RUE as she reaches to brace herself while rolling.   Dependent assist for supine<>sitting and for squat pivot transfer into TIS w/c. Retrieved chest strap and applied to TIS w/c to help improve thoracic extension and safety while sitting in w/c. Communicated with MD regarding obtaining a soft cervical brace to help with positioning and cervical extension while she sits in the wheelchair.   Completed dependent squat pivot to mat table and then placed into supine position on mat table. Attempted to place her again in prone position with +2 assist but unable to sufficiently get fully prone and her arms externally rotated and abducted enough, away from her chest. Pt grimacing with attempting prone positioning.   Returned to TIS w/c and adjusted head rest support to allow more support and less cervical extension. Would benefit from gradual decreased in support to promote cervical extension as tolerated.   Returned to her room in TIS w/c, reclined with chest strap on, all needs met.   Therapy Documentation Precautions:  Precautions Precautions: Fall Precaution Comments:  Contact precautions & delayed processing Restrictions Weight Bearing Restrictions: No General:      Therapy/Group: Individual Therapy  Aireana Ryland P Tene Gato 11/28/2022, 12:03 PM

## 2022-11-28 NOTE — Progress Notes (Signed)
Occupational Therapy Note  Patient Details  Name: Nicole [name] MRN: [id number] Date of Birth: [birth year]  Today's Date: 11/28/2022 OT Missed Time: 30 Minutes Missed Time Reason: Unavailable (comment);Other (comment) (pt eating breakfast with nursing)  Pt eating breakfast with nursing assistance, will f/u as able to to make up for missed minutes.   [name] [facility] 11/28/2022, 12:59 PM

## 2022-11-28 NOTE — Progress Notes (Signed)
PROGRESS NOTE   Subjective/Complaints: No new complaints this morning Working with Ephriam Knuckles PT No noticeable improvements with amantadine or baclofen  ROS: Limited due to cognitive/behavioral     Objective:   No results found. No results for input(s): "WBC", "HGB", "HCT", "PLT" in the last 72 hours.  No results for input(s): "NA", "K", "CL", "CO2", "GLUCOSE", "BUN", "CREATININE", "CALCIUM" in the last 72 hours.   Intake/Output Summary (Last 24 hours) at 11/28/2022 1055 Last data filed at 11/28/2022 1610 Gross per 24 hour  Intake 716 ml  Output --  Net 716 ml      Pressure Injury 10/31/22 Buttocks Left Unstageable - Full thickness tissue loss in which the base of the injury is covered by slough (yellow, tan, gray, green or brown) and/or eschar (tan, brown or black) in the wound bed. (Active)  10/31/22 2000  Location: Buttocks  Location Orientation: Left  Staging: Unstageable - Full thickness tissue loss in which the base of the injury is covered by slough (yellow, tan, gray, green or brown) and/or eschar (tan, brown or black) in the wound bed.  Wound Description (Comments):   Present on Admission: Yes     Pressure Injury 11/18/22 Toe (Comment  which one) Anterior;Right Deep Tissue Pressure Injury - Purple or maroon localized area of discolored intact skin or blood-filled blister due to damage of underlying soft tissue from pressure and/or shear. small are (Active)  11/18/22 1300  Location: Toe (Comment  which one)  Location Orientation: Anterior;Right  Staging: Deep Tissue Pressure Injury - Purple or maroon localized area of discolored intact skin or blood-filled blister due to damage of underlying soft tissue from pressure and/or shear.  Wound Description (Comments): small area on right great toe metatarsal  Present on Admission: Yes     Pressure Injury 11/18/22 Toe (Comment  which one) Anterior;Right Stage 1 -   Intact skin with non-blanchable redness of a localized area usually over a bony prominence. small pink area of non blanchable skin on Right pinky toe (Active)  11/18/22 1900  Location: Toe (Comment  which one)  Location Orientation: Anterior;Right  Staging: Stage 1 -  Intact skin with non-blanchable redness of a localized area usually over a bony prominence.  Wound Description (Comments): small pink area of non blanchable skin on Right pinky toe  Present on Admission: Yes     Pressure Injury 11/22/22 Toe (Comment  which one) Anterior;Right Stage 2 -  Partial thickness loss of dermis presenting as a shallow open injury with a red, pink wound bed without slough. small scratch on dorsal aspect of foot with dark purple brusing ar (Active)  11/22/22 1930  Location: Toe (Comment  which one)  Location Orientation: Anterior;Right  Staging: Stage 2 -  Partial thickness loss of dermis presenting as a shallow open injury with a red, pink wound bed without slough.  Wound Description (Comments): small scratch on dorsal aspect of foot with dark purple brusing around the base of the foot possible medical device related to Ted hose  Present on Admission: No    Physical Exam: Vital Signs Blood pressure 109/78, pulse 89, temperature 97.6 F (36.4 C), resp. rate 18, height 5\' 1"  (1.549  m), SpO2 100%.  Gen: working with therapy HEENT: oral mucosa pink and moist, NCAT Cardio: Reg rate Chest: normal effort, normal rate of breathing Abd: soft, non-distended Ext: no edema Psych: pleasant, appears comfortable   Skin: No evidence of breakdown, no evidence of rash Neurologic: pt still barely responds to verbal cues, makes some eye contact but still inconsistent.,  increased tone and no movement to command when testing proximal UE  0/5 Bilateral , hip flexor, knee extensors, ankle dorsiflexor and plantar flexor Sensory exam unable to assess other than grimace to pinch of bilateral fingers and toes  Increased  elbow flexor tone MAS3 , MAS 3 at pecs , LE tone reduced MAS 0  Cerebellar exam unable to assess  Musculoskeletal: reduced bilateral shoulder and elbow ROM due to increased tone    Assessment/Plan: 1. Functional deficits which require 3+ hours per day of interdisciplinary therapy in a comprehensive inpatient rehab setting. Physiatrist is providing close team supervision and 24 hour management of active medical problems listed below. Physiatrist and rehab team continue to assess barriers to discharge/monitor patient progress toward functional and medical goals  Care Tool:  Bathing        Body parts bathed by helper: Right arm, Left arm, Chest, Abdomen, Front perineal area, Buttocks, Right upper leg, Left upper leg, Right lower leg, Left lower leg, Face     Bathing assist Assist Level: Dependent - Patient 0%     Upper Body Dressing/Undressing Upper body dressing   What is the patient wearing?: Pull over shirt    Upper body assist Assist Level: Dependent - Patient 0%    Lower Body Dressing/Undressing Lower body dressing      What is the patient wearing?: Pants     Lower body assist Assist for lower body dressing: Dependent - Patient 0%     Toileting Toileting Toileting Activity did not occur (Clothing management and hygiene only): N/A (no void or bm)  Toileting assist Assist for toileting: 2 Helpers     Transfers Chair/bed transfer  Transfers assist  Chair/bed transfer activity did not occur: Safety/medical concerns  Chair/bed transfer assist level: Total Assistance - Patient < 25%     Locomotion Ambulation   Ambulation assist   Ambulation activity did not occur: Safety/medical concerns (attempted with 3 musketteers and +2 for WC follow but not safe)          Walk 10 feet activity   Assist  Walk 10 feet activity did not occur: Safety/medical concerns        Walk 50 feet activity   Assist Walk 50 feet with 2 turns activity did not occur:  Safety/medical concerns         Walk 150 feet activity   Assist Walk 150 feet activity did not occur: Safety/medical concerns         Walk 10 feet on uneven surface  activity   Assist Walk 10 feet on uneven surfaces activity did not occur: Safety/medical concerns         Wheelchair     Assist Is the patient using a wheelchair?: Yes Type of Wheelchair: Manual    Wheelchair assist level: Dependent - Patient 0%      Wheelchair 50 feet with 2 turns activity    Assist    Wheelchair 50 feet with 2 turns activity did not occur: Safety/medical concerns       Wheelchair 150 feet activity     Assist  Wheelchair 150 feet activity did not occur: Safety/medical  concerns       Blood pressure 109/78, pulse 89, temperature 97.6 F (36.4 C), resp. rate 18, height 5\' 1"  (1.549 m), SpO2 100%.  Medical Problem List and Plan: 1. 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 Extubated 11/05/2022 Fluctuating level of alertness             -patient may  shower             -ELOS/Goals: min/modA but prognosis is guarded due to severe cognitive deficits goals 28-30d  -Continue CIR therapies including PT, OT, and SLP    Tasia Catchings bed ordered  Mother updated  Palliative care consulted, discussed home with hospice but patient is not a candidate  MRI brain ordered given decline in speech-->no acute abnormality evident  Observed therapy, discussed disposition with SW, applying for nursing home placement  -discussed with PT and SW applying for nursing homes  -obtaining guardianship discussed with mother  2.  Impaired mobility: continue Lovenox             -antiplatelet therapy: N/A 3. Pain Management: Tylenol as needed 4. Mood/Behavior/Sleep: Provide emotional support             -antipsychotic agents: N/A 5. Neuropsych/cognition: This patient is not capable of making decisions on her own behalf. 6. Left buttock  unstageable ulcer.  Continue Medihoney to buttock wound daily cover with foam dressing.  Changing dressing every 3 days or as needed soiling  7. Fluids/Electrolytes/Nutrition: Routine in and outs with follow-up chemistries  8.  Cavitary right lower lobe pneumonia likely aspiration pneumonia/MRSA pneumonia.  Continue course of Zyvox as indicated per infectious disease  9.  History of drug abuse.  Positive cocaine on urine drug screen.  Provide counseling  10.  AKI/hypovolemia and ATN.  Renal function much improved.  Repeat Cr today.    Latest Ref Rng & Units 11/24/2022    6:25 AM 11/15/2022    4:23 AM 11/14/2022    4:09 AM  BMP  Glucose 70 - 99 mg/dL  93  99   BUN 6 - 20 mg/dL  17  15   Creatinine 4.09 - 1.00 mg/dL 8.11  9.14  7.82   Sodium 135 - 145 mmol/L  136  134   Potassium 3.5 - 5.1 mmol/L  3.9  4.1   Chloride 98 - 111 mmol/L  102  102   CO2 22 - 32 mmol/L  24  23   Calcium 8.9 - 10.3 mg/dL  8.5  8.3     11.  Mild transaminitis with rhabdomyolysis.  CK reviewed and normalized  12.  Hypotension.  Improving, decrease ProAmatine to 5mg  daily  13. Impaired initiation: continue amantadine to 100mg  BID--still minimal   7/14 had hoped to add ritalin but it's contraindicated with Zyvox (MAOI). Can't use provigil either.  -will bump amantadine to 200mg  bid, continue  14. Suboptimal vitamin D: continue 1,000U D3 daily  15. Spasticity: increase baclofen to 10mg  TID, BP reviewed and stable with this increase  16. Bowel and bladder incontinence: bowel and bladder program   >50 minutes spent in observation of therapy, discussion of lack of progress with therapy with SW, discussed applying for SNF with PT and SW, obtaining guardianship discussed with mother  LOS: 10 days A FACE TO FACE EVALUATION WAS PERFORMED  Clint Bolder P Deloris Moger 11/28/2022, 10:55 AM

## 2022-11-28 NOTE — Progress Notes (Addendum)
Speech Language Pathology Daily Session Note  Patient Details  Name: Nicole [name] MRN: [id number] Date of Birth: 16-Sep-1975  Today's Date: 11/28/2022 SLP Individual Time: 0902-0958 SLP Individual Time Calculation (min): 56 min  Short Term Goals: Week 2: SLP Short Term Goal 1 (Week 2): Patient will utilize multimodal communication to answer basic yes/no questions with Max A multimodal cues in 25% of opportunities SLP Short Term Goal 2 (Week 2): Patient will follow 1-step commands within a context in 25% of opportunities with Max A multimodal cues. SLP Short Term Goal 3 (Week 2): Patient will vocalize on command in 25% of opportunities with Max A multimodal cues. SLP Short Term Goal 4 (Week 2): Pt will present w/ appropriate response to functional stimuli (i.e. accept spoon, grasp basic item appropriately, response to tactile stimuli)  Skilled Therapeutic Interventions:  Pt was seen in am to address cognitive re- training. Pt was alert upon SLP arrival and seen at bedside. Pt acknowleged source of voicing c/b looking toward clinician upon SLP arrival. Pt repositioned upright with HOB support. Pt observed repetitively scratching at her neck, and side of face. SLP observed small red bumps along pt's neck and jaw line. Atttempted to remove pt's hand from scratching with immediate return to site. SLP removed pt's hair from her face due to her fingers frequently getting caught in tangles while attempting to scratch. Nursing notified of small red bumps. SLP initiating oral hygiene. SLP initially provided hand over hand assist for brushing her teeth with pt able to continue motion indep given verbal cues to brush. SLP completed remainder of oral hygiene for thoroughness. Pt appeared to reflexively open her mouth in response to object coming toward it as opposed to responding to SLP verbal cues for opening her mouth this date in response to toothbrush. She followed x1 command to look up to meet clinician's eyes.  She was able to sustain eye contact for 8 seconds before breaking eye contact. She successfully held eye contact in x1 additional trial sustaining eye contact for 3 seconds. Unable to get pt to sustain eye contact in additional trials. She also did not follow additional functional commands this date despite max verbal cues and model. Attempted to initiate self feeding to facilitate greater independence with possible diet upgrade. Cracker placed in pt's right hand with pt clutching cracker requiring hand over hand to move bolus to her mouth with no oral acceptance. Further trials discontinued. Pt was left at bedside. SLP to continue POC.  Pain Pain Assessment Pain Scale: Faces Faces Pain Scale: No hurt  Therapy/Group: Individual Therapy  [name] 11/28/2022, 9:53 AM

## 2022-11-28 NOTE — Progress Notes (Signed)
Occupational Therapy Session Note  Patient Details  Name: Nicole [name] MRN: [id number] Date of Birth: 01/27/1976  Today's Date: 11/28/2022 OT Individual Time: 1430-1531 OT Individual Time Calculation (min): 61 min    Short Term Goals: Week 2:  OT Short Term Goal 1 (Week 2): Patient will visually attend to self in mirror x 3-5 seconds with max cueing as prerequisite to grooming tasks OT Short Term Goal 2 (Week 2): Patient will participate following hand over hand cueing in familiar functional task - e.g. brushing hair, brushing teeth, washing body with RUE and mod assist OT Short Term Goal 3 (Week 2): Patient will sit unsupported x 5 seconds in preparation for level surface transfer OT Short Term Goal 4 (Week 2): Patient will demonstrate localized response (looking toward)to auditory stimulus (name being called) 3 of 5 trials from supported position. OT Short Term Goal 5 (Week 2): Patient will demonstrate improved passive or active assisted elbow range of motion in preparation for assistance with BADL.  Right -5* extension, Left - 15* extension  Skilled Therapeutic Interventions/Progress Updates:   Pt seen for skilled OT this pm with all STGs addressed. Mom present at start of session for simple education to carryover Goal 1 and 2 sink side. OT performed warm up gentle trunk/head and B UE ROM in preparation for alerting to stimulus and relaxation as pt sitting in TISW with flexed elbows and R hand reflexively grasping hair/eat and neck. Pt with limited visual regard to self in mirror during simple face washing, hair care and oral care however R>L hand grasp and initiation with max cues and hand over hand support chained fading to mod for brief intervals. OT transported pt to and from main gym for table top then mirror activity involving table slides then large theraball trunk control rolling task. Very fleeting attention with full hand over hand facilitation required. Once back in room, OT and NT who  provided +2 S performed TIS to bed laterally via modified squat pivot with total A. Pt needed max A to sit EOB with significant forward head flexion. D/t fatigue, OT returned pt to side lying for skin protection and left with all needs and safety measures in place.   Pain: no s/s of pain throughout session   Therapy Documentation Precautions:  Precautions Precautions: Fall Precaution Comments: Contact precautions & delayed processing Restrictions Weight Bearing Restrictions: No   Therapy/Group: Individual Therapy  Vicenta Dunning 11/28/2022, 7:50 AM

## 2022-11-29 MED ORDER — HYDROCERIN EX CREA
TOPICAL_CREAM | Freq: Two times a day (BID) | CUTANEOUS | Status: DC
  Administered 2023-01-16 – 2023-06-09 (×5): 1 via TOPICAL
  Filled 2022-11-29 (×4): qty 113

## 2022-11-29 NOTE — Progress Notes (Signed)
Speech Language Pathology Daily Session Note  Patient Details  Name: Nicole [name] MRN: [id number] Date of Birth: February 01, 1976  Today's Date: 11/29/2022 SLP Individual Time: 0903-0920 SLP Individual Time Calculation (min): 17 min and Today's Date: 11/29/2022 SLP Missed Time: 43 Minutes Missed Time Reason: Patient fatigue  Short Term Goals: Week 2: SLP Short Term Goal 1 (Week 2): Patient will utilize multimodal communication to answer basic yes/no questions with Max A multimodal cues in 25% of opportunities SLP Short Term Goal 2 (Week 2): Patient will follow 1-step commands within a context in 25% of opportunities with Max A multimodal cues. SLP Short Term Goal 3 (Week 2): Patient will vocalize on command in 25% of opportunities with Max A multimodal cues. SLP Short Term Goal 4 (Week 2): Pt will present w/ appropriate response to functional stimuli (i.e. accept spoon, grasp basic item appropriately, response to tactile stimuli)  Skilled Therapeutic Interventions:  Pt was seen in am to address cognitive re- training. Pt reclined in TIS WC sleeping upon SLP arrival. She briefly opened her right eye in response to auditory stimulus before returning to somnolence. SLP provided tacctile stimulation in order to achieve arousal with no avail. Assigned nurse present during portion of session also attempting to gain pt alertness. SLP trialed repositioning pt more upright in TIS WC while holding pt's head up. SLP also trialed thermal stimulation via wet wash cloth to face and trial of chocolate pudding to inner lip without success. Pt likely fatigued from OT session immediately prior to this session. Thus pt missed 43 minutes skilled SLP session. SLP plan to make up minutes as able. Pt left reclined in TIS WC with call button within reach. SLP to continue POC.  Pain  Unable to assess. Pt did not appear to be in pain.   Therapy/Group: Individual Therapy  [name] 11/29/2022, 9:58 AM

## 2022-11-29 NOTE — Progress Notes (Signed)
Physical Therapy Session Note  Patient Details  Name: Nicole [name] MRN: [id number] Date of Birth: 05/15/1976  Today's Date: 11/29/2022 PT Individual Time: 1445-1530 PT Individual Time Calculation (min): 45 min   Short Term Goals: Week 2:   PT Short Term Goal 1 (Week 2): Pt will initiate bed mobility but require no more than totalA PT Short Term Goal 2 (Week 2): Pt will maintain sitting balance unsupported for 30 seconds PT Short Term Goal 3 (Week 2): Pt will demonstrate command follow for ~15% of tasks PT Short Term Goal 4 (Week 2): Pt will be assessed for custom wheelchair needs  Skilled Therapeutic Interventions/Progress Updates:      Pt in TIS w/c on arrival and transported to main rehab gym. Completed dependent squat pivot transfer to mat table with +2 assist on standby.  Worked on sitting balance, upright tolerance, PROM for cervical extension and B elbow extension. Used 6" platform under her feet for better support and 90/90 at hips/knees. Rehab tech on large therapy ball behind patient and using a towel roll across forehead to assist with cervical extension positioning. Hand-over-hand assist for weight bearing on RUE through mat table but unable to extend LUE much due to significant tone. Tried visual scanning tasks and any type of purposeful reaching but none made. Static sitting balance maxA overall and used therapy ball behind her for posterior support.   Returned to TIS w/c with dependent squat pivot transfer and then back to bed in her room in similar manner. Remained supine in bed with B prevalon boots on, all needs met.      Therapy Documentation Precautions:  Precautions Precautions: Fall Precaution Comments: Contact precautions & delayed processing Restrictions Weight Bearing Restrictions: No   Therapy/Group: Individual Therapy  Nicole [name]  PT, DPT, CSRS  11/29/2022, 6:01 AM

## 2022-11-29 NOTE — Patient Care Conference (Signed)
Inpatient RehabilitationTeam Conference and Plan of Care Update Date: 11/29/2022   Time: 11:11 AM    Patient Name: Nicole [name]      Medical Record Number: [id number]  Date of Birth: 02/09/1976 Sex: Female         Room/Bed: 4W09C/4W09C-01 Payor Info: Payor: [employer] / Plan: [employer] / Product Type: *No Product type* /    Admit Date/Time:  11/18/2022  3:15 PM  Primary Diagnosis:  Hypoxic brain injury [facility])  Hospital Problems: Principal Problem:   Hypoxic brain injury [facility])    Expected Discharge Date: Expected Discharge Date:  (SNF pending)  Team Members Present: Physician leading conference: Dr. Sula Soda Social Worker Present: Dossie Der, LCSW Nurse Present: Chana Bode, RN PT Present: Wynelle Link, PT OT Present: Roney Mans, OT SLP Present: Other (comment) Abbe Amsterdam, SLP) PPS Coordinator present : Fae Pippin, SLP     Current Status/Progress Goal Weekly Team Focus  Bowel/Bladder   Incontinent of B/B LBM: 07/16   regain continence   timed toileting qshift and PRN    Swallow/Nutrition/ Hydration   Dysphagia 3 and thin liquids with full supervision. Attempted to facilitate self feeding for greater independence with possible diet upgrade with limited return.   Supervision  trials of regular    ADL's   dependent in all ADLs and transfers   Mod assist   improve arousal, focused attention, visually attending in functional tasks; mom ongoing education    Mobility               Communication   Pt with minimal response to close ended questions, following some basic commands given max verbal cues and hand over hand (i.e. motion of brushing teeth, opening her mouth, looking up)   changed to Max A due to slow progress and severity of deficits   respond to yes/no questions, initiate communication, follow commands via multimodal communication    Safety/Cognition/ Behavioral Observations  total A - remains with little to no  initiation   Max A due to slow progress and severity of deficits   sustained atttention, initiation of functional tasks    Pain   nonverbal, grimaces with movement, no guarding   become free of pain   assess qshift and prn    Skin   Unstageable to buttocks   promote healing and prevent further breakdown  assess qshift and PRN      Discharge Planning:  Mom will not be able to provide the care pt will require at discharge. Mom to begin guardianship processs. Have started [employer] level 2 process. Will be difficult to place due to several factore, young age, severe deficits, drug history and insurance.   Team Discussion: Patient post hypoxic brain injury. MRI unchanged from previous note.  Amantadine dosage increased; noting more awake (eyes open) Patient on target to meet rehab goals: no, continues to require dependent care, incontinent of bowel and bladder.   *See Care Plan and progress notes for long and short-term goals.   Revisions to Treatment Plan:  PROM for contractures and spasticity Tilt table/KREG bed Soft collar/palm protector   Teaching Needs: Safety, medications, skin care, dietary modifications, transfers, toileting, etc.  Current Barriers to Discharge: Home enviroment access/layout, Incontinence, Wound care, and Lack of/limited family support  Possible Resolutions to Barriers: Family education SNF recommended Guardianship application initiated My chart access application initiated     Medical Summary Current Status: left hand maceration, spasticity, hypoxic brain injury, hypotension, decreased initation  Barriers to  Discharge: Medical stability  Barriers to Discharge Comments: left hand maceration, spasticity, hypoxic brain injury, hypotension, decreased initiation Possible Resolutions to [employer] Focus: Eucerin ordered, baclofen 10mg  TID ordered, repeat MRI brain ordered, d/c midodrine, continue tilt-table, continue amantadine, hand splint  ordered   Continued Need for Acute Rehabilitation Level of Care: The patient requires daily medical management by a physician with specialized training in physical medicine and rehabilitation for the following reasons: Direction of a multidisciplinary physical rehabilitation program to maximize functional independence : Yes Medical management of patient stability for increased activity during participation in an intensive rehabilitation regime.: Yes Analysis of laboratory values and/or radiology reports with any subsequent need for medication adjustment and/or medical intervention. : Yes   I attest that I was present, lead the team conference, and concur with the assessment and plan of the team.   Chana Bode B 11/29/2022, 3:05 PM

## 2022-11-29 NOTE — Progress Notes (Addendum)
Patient ID: Nicole [name], female   DOB: Sep 16, 1975, 47 y.o.   MRN: [id number]  Spoke with Mom via telephone to answer her questions regarding guardianship. She went to the clerk of court yesterday to get the paperwork. Will bring in tomorrow and will see if can complete the form. Mom taking day off today to rest and will be here tomorrow. Aware team conference today will call her to update her.  1:42 PM Spoke with Mom regarding team conference progress this week. More awake and smiling more. Speech to trial regular diet. See Mom tomorrow to try to assist with guardianship form. Do has [employer] number so can go ahead and begin SNF search

## 2022-11-29 NOTE — Progress Notes (Signed)
Occupational Therapy Session Note  Patient Details  Name: Nicole [name] MRN: [id number] Date of Birth: [birth year]  Session1: Today's Date: 11/29/2022 OT Individual Time: 295-284 OT Individual Time Calculation (min): 55 min  Session 2: Today's Date: 11/29/2022 OT Individual Time: 1324-4010 OT Individual Time Calculation (min): 34 min   Short Term Goals: Week 2:  OT Short Term Goal 1 (Week 2): Patient will visually attend to self in mirror x 3-5 seconds with max cueing as prerequisite to grooming tasks OT Short Term Goal 2 (Week 2): Patient will participate following hand over hand cueing in familiar functional task - e.g. brushing hair, brushing teeth, washing body with RUE and mod assist OT Short Term Goal 3 (Week 2): Patient will sit unsupported x 5 seconds in preparation for level surface transfer OT Short Term Goal 4 (Week 2): Patient will demonstrate localized response (looking toward)to auditory stimulus (name being called) 3 of 5 trials from supported position. OT Short Term Goal 5 (Week 2): Patient will demonstrate improved passive or active assisted elbow range of motion in preparation for assistance with BADL.  Right -5* extension, Left - 15* extension  Skilled Therapeutic Interventions/Progress Updates:    Session 1: Pt in bed upon therapy arrival with head turned to the right and eyes open.   Session focused on attention to task, increasing alertness, gentle ROM to UE and LE, sitting balance, and functional transfers.  Pt was nonverbal during session although OT made point to verbalize and communicate all directions and activity directions.  Gentle passive ROM completed at bed level prior to any mobility to warm up and lubricate joints. Pt presents with increased BUE flexor tone primarily. Resistive to passive movement although was able to gradually relax with slow range and manual muscle stimulation.   P/ROM  - supine, BUE, shoulder flexion/extension, abduction/adduction, elbow  flexion/extension, wrist flexion/extension, composite finger flexion/extension, 1 set, 5X. - Supine, BLE, ankle pumps, knee flexion/extension, lumbar rotation (right/left), 1 set, 5X.  Noted skin maceration on left hand palm and on lateral aspects of fingers/webspace. Completed hand hygiene and provided palm protector. Sign placed at [facility] to inform nursing of purpose and wearing schedule. Notified MD regarding skin breakdown.   Total A provided for brief change and peri care at bed level including LB dressing. Total Ax1 required for rolling right and left. Completed sitting balance activity while OT provided total assist to don t-shirt. Min A Posterior support provided with bed in lowest position and step placed under feet for height discrepancy.    Pt completed functional squat pivot transfer from bed to TIS chair requiring total assist.   Pt demonstrated fascial grimacing throughout session during upper and lower body mobility and during hand hygiene. Monitored during session. Provided reassurance and modified tasks throughout session to increase comfort.   Session 2: Pt sitting up in TIS with eyes closed, no longer inclined, and neck fully flexed with right side rotation.  Session focused on manual techniques to neck in order to increase ROM and decrease tone and right side rotation preference.  Myofascial release and manual stretching completed to bilateral cervical region, posterior neck, and bilateral upper trapezius to decrease max fascial restrictions and increase joint mobility in a pain free zone. Pt had good response to manual techniques while also intermittently relaxing neck decreasing resistance to mobility.  P/ROM:  - semi reclined in TIS, cervical extension (headrest moved), right/left rotation, right/left lateral bend, 5X, 1 set. Provided slow and progressing stretching.   Suboccipital release completed  to release fascia and muscle tension in order to decrease neck pain.    Kinsiotape applied to posterior neck to provide postural support and tactile cues for improved cervical alignment. 1st strip: "I" strip placed horizontal at C7 with 50% tension.  2nd strip: "Y" strip placed with base at T2 and tails extending up on either side of cervical spine.    Therapy Documentation Precautions:  Precautions Precautions: Fall Precaution Comments: Contact precautions & delayed processing Restrictions Weight Bearing Restrictions: No  Therapy/Group: Individual Therapy  Limmie Patricia, OTR/L,CBIS  Supplemental OT - [facility] and [facility] Secure Chat Preferred   11/29/2022, 7:58 AM

## 2022-11-29 NOTE — Plan of Care (Signed)
  Problem: RH BOWEL ELIMINATION Goal: RH STG MANAGE BOWEL WITH ASSISTANCE Description: STG Manage Bowel with mod I Assistance. Outcome: Progressing Goal: RH STG MANAGE BOWEL W/MEDICATION W/ASSISTANCE Description: STG Manage Bowel with Medication with mod I Assistance. Outcome: Progressing   Problem: RH BLADDER ELIMINATION Goal: RH STG MANAGE BLADDER WITH ASSISTANCE Description: STG Manage Bladder With toileting Assistance Outcome: Progressing   Problem: RH SKIN INTEGRITY Goal: RH STG MAINTAIN SKIN INTEGRITY WITH ASSISTANCE Description: STG Maintain Skin Integrity With min  Assistance. Outcome: Progressing   Problem: RH SAFETY Goal: RH STG ADHERE TO SAFETY PRECAUTIONS W/ASSISTANCE/DEVICE Description: STG Adhere to Safety Precautions With cues Assistance/Device. Outcome: Progressing   Problem: RH COGNITION-NURSING Goal: RH STG USES MEMORY AIDS/STRATEGIES W/ASSIST TO PROBLEM SOLVE Description: STG Uses Memory Aids/Strategies With cues Assistance to Problem Solve. Outcome: Progressing

## 2022-11-29 NOTE — Progress Notes (Signed)
PROGRESS NOTE   Subjective/Complaints: No new complaints this morning Signed form for mother to have guardianship Team conference today Eucerin added for left hand maceration  ROS: Limited due to cognitive/behavioral     Objective:   No results found. No results for input(s): "WBC", "HGB", "HCT", "PLT" in the last 72 hours.  No results for input(s): "NA", "K", "CL", "CO2", "GLUCOSE", "BUN", "CREATININE", "CALCIUM" in the last 72 hours.   Intake/Output Summary (Last 24 hours) at 11/29/2022 1034 Last data filed at 11/28/2022 1700 Gross per 24 hour  Intake 474 ml  Output --  Net 474 ml      Pressure Injury 10/31/22 Buttocks Left Unstageable - Full thickness tissue loss in which the base of the injury is covered by slough (yellow, tan, gray, green or brown) and/or eschar (tan, brown or black) in the wound bed. (Active)  10/31/22 2000  Location: Buttocks  Location Orientation: Left  Staging: Unstageable - Full thickness tissue loss in which the base of the injury is covered by slough (yellow, tan, gray, green or brown) and/or eschar (tan, brown or black) in the wound bed.  Wound Description (Comments):   Present on Admission: Yes     Pressure Injury 11/18/22 Toe (Comment  which one) Anterior;Right Deep Tissue Pressure Injury - Purple or maroon localized area of discolored intact skin or blood-filled blister due to damage of underlying soft tissue from pressure and/or shear. small are (Active)  11/18/22 1300  Location: Toe (Comment  which one)  Location Orientation: Anterior;Right  Staging: Deep Tissue Pressure Injury - Purple or maroon localized area of discolored intact skin or blood-filled blister due to damage of underlying soft tissue from pressure and/or shear.  Wound Description (Comments): small area on right great toe metatarsal  Present on Admission: Yes     Pressure Injury 11/18/22 Toe (Comment  which one)  Anterior;Right Stage 1 -  Intact skin with non-blanchable redness of a localized area usually over a bony prominence. small pink area of non blanchable skin on Right pinky toe (Active)  11/18/22 1900  Location: Toe (Comment  which one)  Location Orientation: Anterior;Right  Staging: Stage 1 -  Intact skin with non-blanchable redness of a localized area usually over a bony prominence.  Wound Description (Comments): small pink area of non blanchable skin on Right pinky toe  Present on Admission: Yes     Pressure Injury 11/22/22 Toe (Comment  which one) Anterior;Right Stage 2 -  Partial thickness loss of dermis presenting as a shallow open injury with a red, pink wound bed without slough. small scratch on dorsal aspect of foot with dark purple brusing ar (Active)  11/22/22 1930  Location: Toe (Comment  which one)  Location Orientation: Anterior;Right  Staging: Stage 2 -  Partial thickness loss of dermis presenting as a shallow open injury with a red, pink wound bed without slough.  Wound Description (Comments): small scratch on dorsal aspect of foot with dark purple brusing around the base of the foot possible medical device related to Ted hose  Present on Admission: No    Physical Exam: Vital Signs Blood pressure 104/74, pulse 94, temperature 98.2 F (36.8 C), resp. rate  16, height 5\' 1"  (1.549 m), SpO2 99%.  Gen: working with therapy HEENT: oral mucosa pink and moist, NCAT Cardio: Reg rate Chest: normal effort, normal rate of breathing Abd: soft, non-distended Ext: no edema Psych: pleasant, appears comfortable   Skin: Macerated left hand Neurologic: pt still barely responds to verbal cues, makes some eye contact but still inconsistent.,  increased tone and no movement to command when testing proximal UE  0/5 Bilateral , hip flexor, knee extensors, ankle dorsiflexor and plantar flexor Sensory exam unable to assess other than grimace to pinch of bilateral fingers and toes  Increased  elbow flexor tone MAS3 , MAS 3 at pecs , LE tone reduced MAS 0  Cerebellar exam unable to assess  Musculoskeletal: reduced bilateral shoulder and elbow ROM due to increased tone    Assessment/Plan: 1. Functional deficits which require 3+ hours per day of interdisciplinary therapy in a comprehensive inpatient rehab setting. Physiatrist is providing close team supervision and 24 hour management of active medical problems listed below. Physiatrist and rehab team continue to assess barriers to discharge/monitor patient progress toward functional and medical goals  Care Tool:  Bathing        Body parts bathed by helper: Right arm, Left arm, Chest, Abdomen, Front perineal area, Buttocks, Right upper leg, Left upper leg, Right lower leg, Left lower leg, Face     Bathing assist Assist Level: Dependent - Patient 0%     Upper Body Dressing/Undressing Upper body dressing   What is the patient wearing?: Pull over shirt    Upper body assist Assist Level: Dependent - Patient 0%    Lower Body Dressing/Undressing Lower body dressing      What is the patient wearing?: Pants     Lower body assist Assist for lower body dressing: Dependent - Patient 0%     Toileting Toileting Toileting Activity did not occur (Clothing management and hygiene only): N/A (no void or bm)  Toileting assist Assist for toileting: 2 Helpers     Transfers Chair/bed transfer  Transfers assist  Chair/bed transfer activity did not occur: Safety/medical concerns  Chair/bed transfer assist level: Dependent - Patient 0%     Locomotion Ambulation   Ambulation assist   Ambulation activity did not occur: Safety/medical concerns          Walk 10 feet activity   Assist  Walk 10 feet activity did not occur: Safety/medical concerns        Walk 50 feet activity   Assist Walk 50 feet with 2 turns activity did not occur: Safety/medical concerns         Walk 150 feet activity   Assist Walk 150  feet activity did not occur: Safety/medical concerns         Walk 10 feet on uneven surface  activity   Assist Walk 10 feet on uneven surfaces activity did not occur: Safety/medical concerns         Wheelchair     Assist Is the patient using a wheelchair?: Yes Type of Wheelchair: Manual (tilt in space)    Wheelchair assist level: Dependent - Patient 0%      Wheelchair 50 feet with 2 turns activity    Assist    Wheelchair 50 feet with 2 turns activity did not occur: Safety/medical concerns   Assist Level: Dependent - Patient 0%   Wheelchair 150 feet activity     Assist  Wheelchair 150 feet activity did not occur: Safety/medical concerns   Assist Level: Dependent -  Patient 0%   Blood pressure 104/74, pulse 94, temperature 98.2 F (36.8 C), resp. rate 16, height 5\' 1"  (1.549 m), SpO2 99%.  Medical Problem List and Plan: 1. 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 Extubated 11/05/2022 Fluctuating level of alertness             -patient may  shower             -ELOS/Goals: min/modA but prognosis is guarded due to severe cognitive deficits goals 28-30d  -Continue CIR therapies including PT, OT, and SLP    Tasia Catchings bed ordered  Mother updated  Palliative care consulted, discussed home with hospice but patient is not a candidate  MRI brain ordered given decline in speech-->no acute abnormality evident  Observed therapy, discussed disposition with SW, applying for nursing home placement  -discussed with PT and SW applying for nursing homes  -obtaining guardianship discussed with mother, form signed for her  2.  Impaired mobility: continue Lovenox             -antiplatelet therapy: N/A 3. Pain Management: Tylenol as needed 4. Mood/Behavior/Sleep: Provide emotional support             -antipsychotic agents: N/A 5. Neuropsych/cognition: This patient is not capable of making decisions on her own  behalf. 6. Left buttock unstageable ulcer.  Continue Medihoney to buttock wound daily cover with foam dressing.  Changing dressing every 3 days or as needed soiling  7. Fluids/Electrolytes/Nutrition: Routine in and outs with follow-up chemistries  8.  Cavitary right lower lobe pneumonia likely aspiration pneumonia/MRSA pneumonia.  Continue course of Zyvox as indicated per infectious disease  9.  History of drug abuse.  Positive cocaine on urine drug screen.  Provide counseling  10.  AKI/hypovolemia and ATN.  Renal function much improved.  Repeat Cr today.    Latest Ref Rng & Units 11/24/2022    6:25 AM 11/15/2022    4:23 AM 11/14/2022    4:09 AM  BMP  Glucose 70 - 99 mg/dL  93  99   BUN 6 - 20 mg/dL  17  15   Creatinine 4.09 - 1.00 mg/dL 8.11  9.14  7.82   Sodium 135 - 145 mmol/L  136  134   Potassium 3.5 - 5.1 mmol/L  3.9  4.1   Chloride 98 - 111 mmol/L  102  102   CO2 22 - 32 mmol/L  24  23   Calcium 8.9 - 10.3 mg/dL  8.5  8.3     11.  Mild transaminitis with rhabdomyolysis.  CK reviewed and normalized  12.  Hypotension.  Improving, d/c midodrine  13. Impaired initiation: continue amantadine to 100mg  BID--still minimal   7/14 had hoped to add ritalin but it's contraindicated with Zyvox (MAOI). Can't use provigil either.  -will bump amantadine to 200mg  bid, continue  14. Suboptimal vitamin D: continue 1,000U D3 daily  15. Spasticity: increase baclofen to 10mg  TID, BP reviewed and stable with this increase  16. Bowel and bladder incontinence: bowel and bladder program   17. Left hand skin maceration: Eucerin ordered    LOS: 11 days A FACE TO FACE EVALUATION WAS PERFORMED  Clint Bolder P Jamisyn Langer 11/29/2022, 10:34 AM

## 2022-11-30 DIAGNOSIS — G931 Anoxic brain damage, not elsewhere classified: Principal | ICD-10-CM

## 2022-11-30 DIAGNOSIS — R252 Cramp and spasm: Secondary | ICD-10-CM

## 2022-11-30 DIAGNOSIS — F1490 Cocaine use, unspecified, uncomplicated: Secondary | ICD-10-CM

## 2022-11-30 DIAGNOSIS — J189 Pneumonia, unspecified organism: Secondary | ICD-10-CM

## 2022-11-30 DIAGNOSIS — R32 Unspecified urinary incontinence: Secondary | ICD-10-CM

## 2022-11-30 DIAGNOSIS — L89320 Pressure ulcer of left buttock, unstageable: Secondary | ICD-10-CM

## 2022-11-30 DIAGNOSIS — R159 Full incontinence of feces: Secondary | ICD-10-CM

## 2022-11-30 NOTE — Progress Notes (Signed)
Patient ID: Nicole [name], female   DOB: February 09, 1976, 47 y.o.   MRN: [id number]  Met with Mom to assist with guardianship forms. Mom to take back to court and will await assigned court date and process. Mom aware of team conference yesterday and states: " At least she is not getting worse."  Mom taking each day at a time and trying to do what is needed to assist daughter. Aware will send out FL2 and begin searching for a SNF bed.

## 2022-11-30 NOTE — Progress Notes (Signed)
Speech Language Pathology Daily Session Note  Patient Details  Name: Nicole [name] MRN: [id number] Date of Birth: 03/10/1976  Today's Date: 11/30/2022 SLP Individual Time: 1445-1535 SLP Individual Time Calculation (min): 50 min  Short Term Goals: Week 2: SLP Short Term Goal 1 (Week 2): Patient will utilize multimodal communication to answer basic yes/no questions with Max A multimodal cues in 25% of opportunities SLP Short Term Goal 2 (Week 2): Patient will follow 1-step commands within a context in 25% of opportunities with Max A multimodal cues. SLP Short Term Goal 3 (Week 2): Patient will vocalize on command in 25% of opportunities with Max A multimodal cues. SLP Short Term Goal 4 (Week 2): Pt will present w/ appropriate response to functional stimuli (i.e. accept spoon, grasp basic item appropriately, response to tactile stimuli)  Skilled Therapeutic Interventions:   Pt greeted at bedside w/ her mom present for ST tx session. SLP facilitated a variety of tx tasks to encourage response to stimuli and participation in functional tasks. SLP removed her mit to allow for tactile participation and provided pt w/ cold wet washcloth to wipe her face with. Required HOH assist to wipe her face 2* no initiation or continuation of task. Pt noted to look at SLP intermittently when she was speaking, though unable to maintain gaze for greater that 4-8 seconds. During oral care via suctioning, pt was able to grasp toothbrush and put it in her mouth, however, bite reflex initiated and pt demonstrated no purposeful movement w/ toothbrush. When SLP initiated brushing motion, she was able to continue movement for ~5 seconds before biting the toothbrush again. Once oral care was completed, pt played "punk rock" for pt on a phone given mom's report of preference for punk rock @ baseline. She did look in the direction of the music, but was unable to track music/phone when moved slowly to midline. [name] mit was replaced and  she was left in bed with the alarm set. Call light within reach. Recommend cont ST per POC.   Pain  Unable to report pain, did not appear in distress.   Therapy/Group: Individual Therapy  [name] 11/30/2022, 5:00 PM

## 2022-11-30 NOTE — Progress Notes (Signed)
Physical Therapy Session Note  Patient Details  Name: Nicole [name] MRN: [id number] Date of Birth: [birth year]  Today's Date: 11/30/2022 PT Individual Time: 0822-0925 PT Individual Time Calculation (min): 63 min   and  Today's Date: 11/30/2022 PT Missed Time: 12 Minutes Missed Time Reason: Nursing care  Short Term Goals: Week 2:  PT Short Term Goal 1 (Week 2): Pt will initiate bed mobility but require no more than totalA PT Short Term Goal 2 (Week 2): Pt will maintain sitting balance unsupported for 30 seconds PT Short Term Goal 3 (Week 2): Pt will demonstrate command follow for ~15% of tasks PT Short Term Goal 4 (Week 2): Pt will be assessed for custom wheelchair needs  Skilled Therapeutic Interventions/Progress Updates:    Pt received supported upright in bed with NT present assisting pt with breakfast. Therapist requested NT notified therapist once pt completed. Missed 12 minutes of skilled physical therapy due to meal.    NT notified therapist once finished with meal and therapist returned to start session. Pt awake with eyes open and R gaze preference. Pt is still non-verbal, but did notice she would turn and make eye contact with therapist when cued to, including when therapist on her L side. Therapist communicating and educating pt on what is happening throughout session and cuing for increased pt participation.  Pt continues to have significant tone in bilateral sternocleidomastoids (L>R) resulting in L cervical rotation with cervical flexion - worsens when pt is positioned upright in sitting/standing with gravity.   Pt resting in B UE flexed posturing with increased tone in L UE compared to R- pt continues to move  R hand to rub her head and face.  Started with B LE PROM including: ankle DF/PF (L ankle rests in more PF compared to R), knee flexion/extension, hip flexion/extension - pt with increased tone/spasticity and spasms in R LE with more facial grimacing when moving it compared  to L LE.  Donned B LE thigh high TED hose total assist (needs medium "short" TED hose when available).   Noticed pt incontinent of small BM. Rolling R/L in bed with total/dependent assist (pt attempting to assist with R UE on bedrail when rolling towards L) during dependent LB clothing management and peri-care - nurse notified and present to redress sacral wound.   Donned Kreg bed straps to perform tilt into standing. Placed dycem under pt's feet to prevent them from slipping/sliding.  Progressed to 50degrees of upright (paused for 1 minute at 10degree intervals after reaching 20 degrees upright to manage BP). Pt tolerated standing at 50 degrees of tilt for 15 minutes. Once reached 50 degrees, noted pt loss head control after ~30seconds resulting in her positioned into cervical flexion with R rotation.   While standing performed B UE PROM including: elbow extension, finger extension, and wrist extension  - pt able to get R elbow approximately 5 degrees past 90, but unable to reach 90 degrees with L elbow - pt wearing L hand palm protector throughout - able to relax to open pt's hand but lacks extension at 1st PIP joints in all fingers except pointer  Returned to supine. Repositioned pt into R sidelying with pillows for pressure relief and pt quickly falling asleep. Pt left with needs in reach and nursing staff aware of pt's position.   Therapy Documentation Precautions:  Precautions Precautions: Fall Precaution Comments: Contact precautions & delayed processing Restrictions Weight Bearing Restrictions: No   Pain:  Pt continues to grimace with movements due to  significant hypertonia with muscle spasms - therapist provides slow, gentle movements throughout session for pain management.    Therapy/Group: Individual Therapy  Ginny Forth , PT, DPT, NCS, CSRS 11/30/2022, 7:52 AM

## 2022-11-30 NOTE — [place] FL2 (Addendum)
[place] MEDICAID FL2 LEVEL OF CARE FORM     IDENTIFICATION  Patient Name: Nicole [name] Birthdate: [birth year] Sex: female Admission Date (Current Location): 11/18/2022  [place] and [place] Number:  Haynes Bast [id number] S Facility and Address:  [facility] [place], [address] [place], [place] [address]      Provider Number: [id number]  Attending Physician Name and Address:  Horton Chin, MD  Relative Name and Phone Number:  Maude Leriche [phone]    Current Level of Care: Other (Comment) (Rehab) Recommended Level of Care: Skilled Nursing Facility Prior Approval Number:    Date Approved/Denied:   PASRR Number: [id number]  Discharge Plan: SNF    Current Diagnoses: Patient Active Problem List   Diagnosis Date Noted   Hypoxic brain injury (HCC) 11/18/2022   Cavitary pneumonia 11/05/2022   Substance use 11/05/2022   Acute respiratory failure (HCC) 10/31/2022   Nonallergic rhinitis 08/23/2020   Mild persistent asthma without complication 06/14/2020   Other allergic rhinitis 06/14/2020   Adverse reaction to food, subsequent encounter 06/14/2020   IBS (irritable bowel syndrome) 05/24/2016   Chronic venous insufficiency 05/24/2016   Migraine 05/24/2016   Pulmonary nodules 10/27/2014   Hepatitis C, chronic (HCC)    Depression 10/09/2013   OSA on CPAP - sees Dr. Jenne Campus in ENT 10/09/2013   Allergic rhinitis 05/30/2012   Cough variant asthma 02/08/2012    Orientation RESPIRATION BLADDER Height & Weight        Normal Incontinent Weight:   Height:  5\' 1"  (154.9 cm)  BEHAVIORAL SYMPTOMS/MOOD NEUROLOGICAL BOWEL NUTRITION STATUS      Incontinent Diet (Dys 3 thin liquids)  AMBULATORY STATUS COMMUNICATION OF NEEDS Skin   Total Care Does not communicate PU Stage and Appropriate Care   PU Stage 2 Dressing: Daily                   Personal Care Assistance Level of Assistance  Bathing, Feeding, Dressing, Total care Bathing Assistance:  Maximum assistance Feeding assistance: Maximum assistance Dressing Assistance: Maximum assistance Total Care Assistance: Maximum assistance   Functional Limitations Info  Speech, Sight Sight Info: Impaired   Speech Info: Impaired    SPECIAL CARE FACTORS FREQUENCY  PT (By licensed PT), OT (By licensed OT), Bowel and bladder program, Speech therapy     PT Frequency: 5x week OT Frequency: 5x week Bowel and Bladder Program Frequency: timed toileting 3-4 hours   Speech Therapy Frequency: 5x week      Contractures Contractures Info: Not present    Additional Factors Info  Code Status, Allergies, Isolation Precautions Code Status-DNR Allergies Info: [place] NUTS, CAHSEWS NUTS, CECLOR, NICKEL TRIPTANS     Isolation Precautions Info: MRSA     Current Medications (11/30/2022):  This is the current hospital active medication list Current Facility-Administered Medications  Medication Dose Route Frequency Provider Last Rate Last Admin   acetaminophen (TYLENOL) tablet 650 mg  650 mg Oral Q6H PRN Charlton Amor, PA-C   650 mg at 11/30/22 0758   amantadine (SYMMETREL) capsule 200 mg  200 mg Oral BID Faith Rogue T, MD   200 mg at 11/30/22 0758   baclofen (LIORESAL) tablet 10 mg  10 mg Oral TID Horton Chin, MD   10 mg at 11/30/22 0758   cholecalciferol (VITAMIN D3) 25 MCG (1000 UNIT) tablet 1,000 Units  1,000 Units Oral Daily Horton Chin, MD   1,000 Units at 11/30/22 0758   enoxaparin (LOVENOX) injection 40 mg  40 mg Subcutaneous Q24H Charlton Amor, PA-C   40 mg at 11/29/22 1802   feeding supplement (ENSURE ENLIVE / ENSURE PLUS) liquid 237 mL  237 mL Oral BID BM AngiulliMcarthur Rossetti, PA-C   237 mL at 11/30/22 1029   gadobutrol (GADAVIST) 1 MMOL/ML injection 5 mL  5 mL Intravenous Once PRN Raulkar, Drema Pry, MD       hydrocerin (EUCERIN) cream   Topical BID Carlis Abbott Drema Pry, MD   Given at 11/30/22 0758   leptospermum manuka honey (MEDIHONEY) paste 1 Application  1  Application Topical Daily Charlton Amor, PA-C   1 Application at 11/30/22 9604   multivitamin with minerals tablet 1 tablet  1 tablet Oral Daily Charlton Amor, PA-C   1 tablet at 11/30/22 0758   polyethylene glycol (MIRALAX / GLYCOLAX) packet 17 g  17 g Oral Daily PRN Charlton Amor, PA-C         Discharge Medications: Please see discharge summary for a list of discharge medications.  Relevant Imaging Results:  Relevant Lab Results:   Additional Information SSN: [id number]  Mom working on guardianship of patient  Si Gaul Lemar [place], [place]

## 2022-11-30 NOTE — Plan of Care (Signed)
Patient is non responsive. Mother obtaining guardianship of patient

## 2022-11-30 NOTE — Progress Notes (Signed)
PROGRESS NOTE   Subjective/Complaints: Searching for SNF bed Appreciate SE assisting mom with guardianship No issues overnight  ROS: Limited due to cognitive/behavioral     Objective:   No results found. No results for input(s): "WBC", "HGB", "HCT", "PLT" in the last 72 hours.  No results for input(s): "NA", "K", "CL", "CO2", "GLUCOSE", "BUN", "CREATININE", "CALCIUM" in the last 72 hours.   Intake/Output Summary (Last 24 hours) at 11/30/2022 1324 Last data filed at 11/30/2022 0816 Gross per 24 hour  Intake 716 ml  Output --  Net 716 ml      Pressure Injury 10/31/22 Buttocks Left Unstageable - Full thickness tissue loss in which the base of the injury is covered by slough (yellow, tan, gray, green or brown) and/or eschar (tan, brown or black) in the wound bed. (Active)  10/31/22 2000  Location: Buttocks  Location Orientation: Left  Staging: Unstageable - Full thickness tissue loss in which the base of the injury is covered by slough (yellow, tan, gray, green or brown) and/or eschar (tan, brown or black) in the wound bed.  Wound Description (Comments):   Present on Admission: Yes     Pressure Injury 11/18/22 Toe (Comment  which one) Anterior;Right Deep Tissue Pressure Injury - Purple or maroon localized area of discolored intact skin or blood-filled blister due to damage of underlying soft tissue from pressure and/or shear. small are (Active)  11/18/22 1300  Location: Toe (Comment  which one)  Location Orientation: Anterior;Right  Staging: Deep Tissue Pressure Injury - Purple or maroon localized area of discolored intact skin or blood-filled blister due to damage of underlying soft tissue from pressure and/or shear.  Wound Description (Comments): small area on right great toe metatarsal  Present on Admission: Yes     Pressure Injury 11/18/22 Toe (Comment  which one) Anterior;Right Stage 1 -  Intact skin with  non-blanchable redness of a localized area usually over a bony prominence. small pink area of non blanchable skin on Right pinky toe (Active)  11/18/22 1900  Location: Toe (Comment  which one)  Location Orientation: Anterior;Right  Staging: Stage 1 -  Intact skin with non-blanchable redness of a localized area usually over a bony prominence.  Wound Description (Comments): small pink area of non blanchable skin on Right pinky toe  Present on Admission: Yes     Pressure Injury 11/22/22 Toe (Comment  which one) Anterior;Right Stage 2 -  Partial thickness loss of dermis presenting as a shallow open injury with a red, pink wound bed without slough. small scratch on dorsal aspect of foot with dark purple brusing ar (Active)  11/22/22 1930  Location: Toe (Comment  which one)  Location Orientation: Anterior;Right  Staging: Stage 2 -  Partial thickness loss of dermis presenting as a shallow open injury with a red, pink wound bed without slough.  Wound Description (Comments): small scratch on dorsal aspect of foot with dark purple brusing around the base of the foot possible medical device related to Ted hose  Present on Admission: No    Physical Exam: Vital Signs Blood pressure 102/73, pulse 67, temperature 98 F (36.7 C), resp. rate 18, height 5\' 1"  (1.549 m), SpO2 99%.  Gen: no distress, normal appearing HEENT: oral mucosa pink and moist, NCAT Cardio: Reg rate Chest: normal effort, normal rate of breathing Abd: soft, non-distended Ext: no edema  Psych: pleasant, appears comfortable   Skin: Macerated left hand Neurologic: pt still barely responds to verbal cues, makes some eye contact but still inconsistent.,  increased tone and no movement to command when testing proximal UE  0/5 Bilateral , hip flexor, knee extensors, ankle dorsiflexor and plantar flexor Sensory exam unable to assess other than grimace to pinch of bilateral fingers and toes  Increased elbow flexor tone MAS3 , MAS 3 at  pecs , LE tone reduced MAS 0  Cerebellar exam unable to assess  Musculoskeletal: reduced bilateral shoulder and elbow ROM due to increased tone    Assessment/Plan: 1. Functional deficits which require 3+ hours per day of interdisciplinary therapy in a comprehensive inpatient rehab setting. Physiatrist is providing close team supervision and 24 hour management of active medical problems listed below. Physiatrist and rehab team continue to assess barriers to discharge/monitor patient progress toward functional and medical goals  Care Tool:  Bathing        Body parts bathed by helper: Right arm, Left arm, Chest, Abdomen, Front perineal area, Buttocks, Right upper leg, Left upper leg, Right lower leg, Left lower leg, Face     Bathing assist Assist Level: Dependent - Patient 0%     Upper Body Dressing/Undressing Upper body dressing   What is the patient wearing?: Pull over shirt    Upper body assist Assist Level: Dependent - Patient 0%    Lower Body Dressing/Undressing Lower body dressing      What is the patient wearing?: Pants     Lower body assist Assist for lower body dressing: Dependent - Patient 0%     Toileting Toileting Toileting Activity did not occur (Clothing management and hygiene only): N/A (no void or bm)  Toileting assist Assist for toileting: 2 Helpers     Transfers Chair/bed transfer  Transfers assist  Chair/bed transfer activity did not occur: Safety/medical concerns  Chair/bed transfer assist level: Dependent - Patient 0%     Locomotion Ambulation   Ambulation assist   Ambulation activity did not occur: Safety/medical concerns          Walk 10 feet activity   Assist  Walk 10 feet activity did not occur: Safety/medical concerns        Walk 50 feet activity   Assist Walk 50 feet with 2 turns activity did not occur: Safety/medical concerns         Walk 150 feet activity   Assist Walk 150 feet activity did not occur:  Safety/medical concerns         Walk 10 feet on uneven surface  activity   Assist Walk 10 feet on uneven surfaces activity did not occur: Safety/medical concerns         Wheelchair     Assist Is the patient using a wheelchair?: Yes Type of Wheelchair: Manual (tilt in space)    Wheelchair assist level: Dependent - Patient 0%      Wheelchair 50 feet with 2 turns activity    Assist    Wheelchair 50 feet with 2 turns activity did not occur: Safety/medical concerns   Assist Level: Dependent - Patient 0%   Wheelchair 150 feet activity     Assist  Wheelchair 150 feet activity did not occur: Safety/medical concerns   Assist Level: Dependent - Patient 0%   Blood pressure 102/73,  pulse 67, temperature 98 F (36.7 C), resp. rate 18, height 5\' 1"  (1.549 m), SpO2 99%.  Medical Problem List and Plan: 1. 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 Extubated 11/05/2022 Fluctuating level of alertness             -patient may  shower             -ELOS/Goals: min/modA but prognosis is guarded due to severe cognitive deficits goals 28-30d  -Continue CIR therapies including PT, OT, and SLP    [facility] bed ordered  Mother updated  Palliative care consulted, discussed home with hospice but patient is not a candidate  MRI brain ordered given decline in speech-->no acute abnormality evident  Observed therapy, discussed disposition with SW, applying for nursing home placement  -discussed with PT and SW applying for nursing homes  -obtaining guardianship discussed with mother, form signed for her  2.  Impaired mobility: continue Lovenox             -antiplatelet therapy: N/A 3. Pain Management: Tylenol as needed 4. Mood/Behavior/Sleep: Provide emotional support             -antipsychotic agents: N/A 5. Neuropsych/cognition: This patient is not capable of making decisions on her own behalf. 6. Left buttock  unstageable ulcer.  Continue Medihoney to buttock wound daily cover with foam dressing.  Changing dressing every 3 days or as needed soiling  7. Fluids/Electrolytes/Nutrition: Routine in and outs with follow-up chemistries  8.  Cavitary right lower lobe pneumonia likely aspiration pneumonia/MRSA pneumonia.  Continue course of Zyvox as indicated per infectious disease  9.  History of drug abuse.  Positive cocaine on urine drug screen.  Provide counseling  10.  AKI/hypovolemia and ATN.  Renal function much improved.  Repeat Cr today.    Latest Ref Rng & Units 11/24/2022    6:25 AM 11/15/2022    4:23 AM 11/14/2022    4:09 AM  BMP  Glucose 70 - 99 mg/dL  93  99   BUN 6 - 20 mg/dL  17  15   Creatinine 8.29 - 1.00 mg/dL 5.62  1.30  8.65   Sodium 135 - 145 mmol/L  136  134   Potassium 3.5 - 5.1 mmol/L  3.9  4.1   Chloride 98 - 111 mmol/L  102  102   CO2 22 - 32 mmol/L  24  23   Calcium 8.9 - 10.3 mg/dL  8.5  8.3     11.  Mild transaminitis with rhabdomyolysis.  CK reviewed and normalized  12.  Hypotension.  Improving, d/c midodrine  13. Impaired initiation: continue amantadine to 100mg  BID--still minimal   7/14 had hoped to add ritalin but it's contraindicated with Zyvox (MAOI). Can't use provigil either.  -will bump amantadine to 200mg  bid, continue  14. Suboptimal vitamin D: continue 1,000U D3 daily  15. Spasticity: continue baclofen to 10mg  TID, BP reviewed and stable with this increase  16. Bowel and bladder incontinence: continue bowel and bladder program   17. Left hand skin maceration: Eucerin ordered, discussed hand split    LOS: 12 days A FACE TO FACE EVALUATION WAS PERFORMED  Babe Clenney P Caia Lofaro 11/30/2022, 1:24 PM

## 2022-11-30 NOTE — Progress Notes (Signed)
Occupational Therapy Session Note  Patient Details  Name: Nicole [name] MRN: [id number] Date of Birth: Jun 09, 1975  Today's Date: 11/30/2022 OT Individual Time: 1300-1406 OT Individual Time Calculation (min): 66 min    Short Term Goals: Week 2:  OT Short Term Goal 1 (Week 2): Patient will visually attend to self in mirror x 3-5 seconds with max cueing as prerequisite to grooming tasks OT Short Term Goal 2 (Week 2): Patient will participate following hand over hand cueing in familiar functional task - e.g. brushing hair, brushing teeth, washing body with RUE and mod assist OT Short Term Goal 3 (Week 2): Patient will sit unsupported x 5 seconds in preparation for level surface transfer OT Short Term Goal 4 (Week 2): Patient will demonstrate localized response (looking toward)to auditory stimulus (name being called) 3 of 5 trials from supported position. OT Short Term Goal 5 (Week 2): Patient will demonstrate improved passive or active assisted elbow range of motion in preparation for assistance with BADL.  Right -5* extension, Left - 15* extension  Skilled Therapeutic Interventions/Progress Updates:     Pt received in flexed, side-lying positioning with head, neck, and knees flexed upon OT arrival with pillow noted to be between [name] knees to prevent skin breakdown. Pt with eyes open upon OT arrival, however not turning head or responsive to therapist calling out her name. Pt nonverbal throughout session, although OT intentionally verbalizing and communicating with Pt in effort to elicit response. Pt presenting to not be in pain upon OT arrival, however Pt did wince her face in response to pain/discomfort during passive ROM and stretching. Focus this session sitting balance, activity tolerance, attention to task, task initiation, and ROM to U/LE. RT, Tracey, present throughout session to increase Pt and therapist safety.  Beginning of session focused on guiding Pt through gentle passive ROM in both  BUE/BLEs as preparatory task for bed mobility and ADLs to increase ROM and prevent pain.  Pt presenting with significant flexor tone, however able to relax with slow prolonged stretching. Supine>EOB total A. Worked on sitting balance EOB with Pt able to maintain unsupported sitting balance 2-3 seconds this session. [name] entering room. Engaged [name] in therapy session by having her move around room and call out [name] name or speak to Pt in attempt to elicit a verbal or gestural response, however Pt not turning head, changing gaze, or attempting to verbally respond to mother this session.  Worked on facilitating increased cervical extension for improved neck and postural alignment with therapist on knees behind Pt using a towel roll across forehead to assist with cervical extension positioning. Provided familiar item of wash cloth  to Pt. OT initiated washing [name] face with Pt turning head towards wash cloth. Transitioned to providing max HOH to engage Pt in completing task with Pt able to maintain grasp on wash cloth with RUE and wash bottom half of face with mod HOH A. Provided Pt a shirt and she did change her gaze to look down at t-shirt. Began placing [name] arm in shirt with Pt closely watching shirt as therapist assisted her with dressing.  Pt with incontinent void while sitting EOB. Eob>supine total A. Assisted Pt with anterior/posterior peri-care and LB bathing following total A. Facilitated weight bearing through Le during rolling tasks. Donned clean brief and clean TEDS on Pt total A. Pt was left resting in bed with call bell in reach, mother present in room, and all needs met.    Therapy Documentation Precautions:  Precautions Precautions: Fall Precaution Comments: Contact precautions & delayed processing Restrictions Weight Bearing Restrictions: No   Therapy/Group: Individual Therapy  CAROLY PUREWAL 11/30/2022, 4:24 PM

## 2022-11-30 NOTE — Plan of Care (Signed)
  Problem: Consults Goal: RH STROKE PATIENT EDUCATION Description: See Patient Education module for education specifics  Outcome: Not Progressing   Problem: RH BOWEL ELIMINATION Goal: RH STG MANAGE BOWEL WITH ASSISTANCE Description: STG Manage Bowel with mod I Assistance. Outcome: Not Progressing Goal: RH STG MANAGE BOWEL W/MEDICATION W/ASSISTANCE Description: STG Manage Bowel with Medication with mod I Assistance. Outcome: Not Progressing   Problem: RH BLADDER ELIMINATION Goal: RH STG MANAGE BLADDER WITH ASSISTANCE Description: STG Manage Bladder With toileting Assistance Outcome: Not Progressing   Problem: RH SKIN INTEGRITY Goal: RH STG SKIN FREE OF INFECTION/BREAKDOWN Description: Manage skin with min assist  Outcome: Not Progressing Goal: RH STG MAINTAIN SKIN INTEGRITY WITH ASSISTANCE Description: STG Maintain Skin Integrity With min  Assistance. Outcome: Not Progressing Goal: RH STG ABLE TO PERFORM INCISION/WOUND CARE W/ASSISTANCE Description: STG Able To Perform Incision/Wound Care With Assistance. Outcome: Not Progressing   Problem: RH SAFETY Goal: RH STG ADHERE TO SAFETY PRECAUTIONS W/ASSISTANCE/DEVICE Description: STG Adhere to Safety Precautions With cues Assistance/Device. Outcome: Not Progressing   Problem: RH COGNITION-NURSING Goal: RH STG USES MEMORY AIDS/STRATEGIES W/ASSIST TO PROBLEM SOLVE Description: STG Uses Memory Aids/Strategies With cues Assistance to Problem Solve. Outcome: Not Progressing   Problem: RH KNOWLEDGE DEFICIT Goal: RH STG INCREASE KNOWLEDGE OF DYSPHAGIA/FLUID INTAKE Description: Manage dysphagia with cues Outcome: Not Progressing Goal: RH STG INCREASE KNOWLEDGE OF STROKE PROPHYLAXIS Description: Patient and mother will be able to manage stroke risks with medications and dietary modification using educational resources independently Outcome: Not Progressing   Problem: RH KNOWLEDGE DEFICIT BRAIN INJURY Goal: RH STG INCREASE KNOWLEDGE  OF SELF CARE AFTER BRAIN INJURY Description: Patient and mother will be able to manage post brain injury care with medications and dietary modification using educational resources independently Outcome: Not Progressing   Problem: Consults Goal: RH BRAIN INJURY PATIENT EDUCATION Description: Description: See Patient Education module for eduction specifics Outcome: Not Progressing

## 2022-12-01 LAB — CREATININE, SERUM
Creatinine, Ser: 0.56 mg/dL (ref 0.44–1.00)
GFR, Estimated: >60 mL/min

## 2022-12-01 MED ORDER — AMANTADINE HCL 100 MG PO CAPS
100.0000 mg | ORAL_CAPSULE | Freq: Two times a day (BID) | ORAL | Status: DC
  Administered 2022-12-02 – 2022-12-25 (×48): 100 mg via ORAL
  Filled 2022-12-01 (×50): qty 1

## 2022-12-01 MED ORDER — TIZANIDINE HCL 4 MG PO TABS: 2.0000 mg | ORAL_TABLET | Freq: Every day | ORAL | Status: DC

## 2022-12-01 NOTE — Progress Notes (Signed)
PROGRESS NOTE   Subjective/Complaints: Missed therapy this morning due to sleepiness BP has increased, will decreased amantadine back to 100mg  BID in case this is contributor  ROS: Limited due to cognitive/behavioral     Objective:   No results found. No results for input(s): "WBC", "HGB", "HCT", "PLT" in the last 72 hours.  Recent Labs    12/01/22 0500  CREATININE 0.56     Intake/Output Summary (Last 24 hours) at 12/01/2022 1402 Last data filed at 12/01/2022 1610 Gross per 24 hour  Intake 696 ml  Output --  Net 696 ml      Pressure Injury 10/31/22 Buttocks Left Unstageable - Full thickness tissue loss in which the base of the injury is covered by slough (yellow, tan, gray, green or brown) and/or eschar (tan, brown or black) in the wound bed. (Active)  10/31/22 2000  Location: Buttocks  Location Orientation: Left  Staging: Unstageable - Full thickness tissue loss in which the base of the injury is covered by slough (yellow, tan, gray, green or brown) and/or eschar (tan, brown or black) in the wound bed.  Wound Description (Comments):   Present on Admission: Yes     Pressure Injury 11/18/22 Toe (Comment  which one) Anterior;Right Deep Tissue Pressure Injury - Purple or maroon localized area of discolored intact skin or blood-filled blister due to damage of underlying soft tissue from pressure and/or shear. small are (Active)  11/18/22 1300  Location: Toe (Comment  which one)  Location Orientation: Anterior;Right  Staging: Deep Tissue Pressure Injury - Purple or maroon localized area of discolored intact skin or blood-filled blister due to damage of underlying soft tissue from pressure and/or shear.  Wound Description (Comments): small area on right great toe metatarsal  Present on Admission: Yes     Pressure Injury 11/18/22 Toe (Comment  which one) Anterior;Right Stage 1 -  Intact skin with non-blanchable redness of  a localized area usually over a bony prominence. small pink area of non blanchable skin on Right pinky toe (Active)  11/18/22 1900  Location: Toe (Comment  which one)  Location Orientation: Anterior;Right  Staging: Stage 1 -  Intact skin with non-blanchable redness of a localized area usually over a bony prominence.  Wound Description (Comments): small pink area of non blanchable skin on Right pinky toe  Present on Admission: Yes     Pressure Injury 11/22/22 Toe (Comment  which one) Anterior;Right Stage 2 -  Partial thickness loss of dermis presenting as a shallow open injury with a red, pink wound bed without slough. small scratch on dorsal aspect of foot with dark purple brusing ar (Active)  11/22/22 1930  Location: Toe (Comment  which one)  Location Orientation: Anterior;Right  Staging: Stage 2 -  Partial thickness loss of dermis presenting as a shallow open injury with a red, pink wound bed without slough.  Wound Description (Comments): small scratch on dorsal aspect of foot with dark purple brusing around the base of the foot possible medical device related to Ted hose  Present on Admission: No    Physical Exam: Vital Signs Blood pressure (!) 134/94, pulse (!) 105, temperature 98 F (36.7 C), resp. rate 19, height  5\' 1"  (1.549 m), SpO2 95%.  Gen: no distress, normal appearing HEENT: oral mucosa pink and moist, NCAT Cardio: Tachycardic Chest: normal effort, normal rate of breathing Abd: soft, non-distended Ext: no edema  Psych: pleasant, appears comfortable   Skin: Macerated left hand Neurologic: pt still barely responds to verbal cues, makes some eye contact but still inconsistent.,  increased tone and no movement to command when testing proximal UE  0/5 Bilateral , hip flexor, knee extensors, ankle dorsiflexor and plantar flexor Sensory exam unable to assess other than grimace to pinch of bilateral fingers and toes  Increased elbow flexor tone MAS3 , MAS 3 at pecs , LE tone  reduced MAS 0  Cerebellar exam unable to assess  Musculoskeletal: reduced bilateral shoulder and elbow ROM due to increased tone    Assessment/Plan: 1. Functional deficits which require 3+ hours per day of interdisciplinary therapy in a comprehensive inpatient rehab setting. Physiatrist is providing close team supervision and 24 hour management of active medical problems listed below. Physiatrist and rehab team continue to assess barriers to discharge/monitor patient progress toward functional and medical goals  Care Tool:  Bathing        Body parts bathed by helper: Right arm, Left arm, Chest, Abdomen, Front perineal area, Buttocks, Right upper leg, Left upper leg, Right lower leg, Left lower leg, Face     Bathing assist Assist Level: Dependent - Patient 0%     Upper Body Dressing/Undressing Upper body dressing   What is the patient wearing?: Pull over shirt    Upper body assist Assist Level: Dependent - Patient 0%    Lower Body Dressing/Undressing Lower body dressing      What is the patient wearing?: Pants     Lower body assist Assist for lower body dressing: Dependent - Patient 0%     Toileting Toileting Toileting Activity did not occur (Clothing management and hygiene only): N/A (no void or bm)  Toileting assist Assist for toileting: 2 Helpers     Transfers Chair/bed transfer  Transfers assist  Chair/bed transfer activity did not occur: Safety/medical concerns  Chair/bed transfer assist level: Dependent - Patient 0%     Locomotion Ambulation   Ambulation assist   Ambulation activity did not occur: Safety/medical concerns          Walk 10 feet activity   Assist  Walk 10 feet activity did not occur: Safety/medical concerns        Walk 50 feet activity   Assist Walk 50 feet with 2 turns activity did not occur: Safety/medical concerns         Walk 150 feet activity   Assist Walk 150 feet activity did not occur: Safety/medical  concerns         Walk 10 feet on uneven surface  activity   Assist Walk 10 feet on uneven surfaces activity did not occur: Safety/medical concerns         Wheelchair     Assist Is the patient using a wheelchair?: Yes Type of Wheelchair: Manual (tilt in space)    Wheelchair assist level: Dependent - Patient 0%      Wheelchair 50 feet with 2 turns activity    Assist    Wheelchair 50 feet with 2 turns activity did not occur: Safety/medical concerns   Assist Level: Dependent - Patient 0%   Wheelchair 150 feet activity     Assist  Wheelchair 150 feet activity did not occur: Safety/medical concerns   Assist Level: Dependent - Patient  0%   Blood pressure (!) 134/94, pulse (!) 105, temperature 98 F (36.7 C), resp. rate 19, height 5\' 1"  (1.549 m), SpO2 95%.  Medical Problem List and Plan: 1. 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 Extubated 11/05/2022 Fluctuating level of alertness             -patient may  shower             -ELOS/Goals: min/modA but prognosis is guarded due to severe cognitive deficits goals 28-30d  -Continue CIR therapies including PT, OT, and SLP    Tasia Catchings bed ordered  Mother updated  Palliative care consulted, discussed home with hospice but patient is not a candidate  MRI brain ordered given decline in speech-->no acute abnormality evident  Observed therapy, discussed disposition with SW, applying for nursing home placement  -discussed with PT and SW applying for nursing homes  -obtaining guardianship discussed with mother, form signed for her  2.  Impaired mobility: continue Lovenox             -antiplatelet therapy: N/A 3. Pain Management: Tylenol as needed 4. Mood/Behavior/Sleep: Provide emotional support             -antipsychotic agents: N/A 5. Neuropsych/cognition: This patient is not capable of making decisions on her own behalf. 6. Left buttock unstageable  ulcer.  Continue Medihoney to buttock wound daily cover with foam dressing.  Changing dressing every 3 days or as needed soiling  7. Fluids/Electrolytes/Nutrition: Routine in and outs with follow-up chemistries  8.  Cavitary right lower lobe pneumonia likely aspiration pneumonia/MRSA pneumonia.  Continue course of Zyvox as indicated per infectious disease  9.  History of drug abuse.  Positive cocaine on urine drug screen.  Provide counseling  10.  AKI/hypovolemia and ATN.  Renal function much improved.  Repeat Cr today.    Latest Ref Rng & Units 12/01/2022    5:00 AM 11/24/2022    6:25 AM 11/15/2022    4:23 AM  BMP  Glucose 70 - 99 mg/dL   93   BUN 6 - 20 mg/dL   17   Creatinine 9.14 - 1.00 mg/dL 7.82  9.56  2.13   Sodium 135 - 145 mmol/L   136   Potassium 3.5 - 5.1 mmol/L   3.9   Chloride 98 - 111 mmol/L   102   CO2 22 - 32 mmol/L   24   Calcium 8.9 - 10.3 mg/dL   8.5     11.  Mild transaminitis with rhabdomyolysis.  CK reviewed and normalized  12.  Hypotension.  Improving, d/c midodrine  13. Impaired initiation: decrease amantadine back to 100mg  BID given tachycardia/HTN  14. Suboptimal vitamin D: continue 1,000U D3 daily  15. Spasticity: continue baclofen to 10mg  TID, BP reviewed and stable with this increase  16. Bowel and bladder incontinence: continue bowel and bladder program   17. Left hand skin maceration: Eucerin ordered, discussed hand split  18. Tachycardia: decreased amantadine back to 100mg  BID  19. HTN: decrease amantadine to 100mg  BID    LOS: 13 days A FACE TO FACE EVALUATION WAS PERFORMED  Nicole [name] P Chasin Findling 12/01/2022, 2:02 PM

## 2022-12-01 NOTE — Progress Notes (Signed)
Speech Language Pathology Daily Session Note  Patient Details  Name: Nicole [name] MRN: [id number] Date of Birth: Jun 21, 1975  Today's Date: 12/01/2022 SLP Individual Time: 1400-1445 SLP Individual Time Calculation (min): 45 min  Short Term Goals: Week 2: SLP Short Term Goal 1 (Week 2): Patient will utilize multimodal communication to answer basic yes/no questions with Max A multimodal cues in 25% of opportunities SLP Short Term Goal 2 (Week 2): Patient will follow 1-step commands within a context in 25% of opportunities with Max A multimodal cues. SLP Short Term Goal 3 (Week 2): Patient will vocalize on command in 25% of opportunities with Max A multimodal cues. SLP Short Term Goal 4 (Week 2): Pt will present w/ appropriate response to functional stimuli (i.e. accept spoon, grasp basic item appropriately, response to tactile stimuli)  Skilled Therapeutic Interventions:   Pt greeted in her room. She was in her bed in the seated position w/ her mom at bedside. [name] mom was assisting to feed the pt her noon meal. SLP offered to take over and provided pt w/ the final 25% of her meal. Pt remains DEP for self feeding at this time. She was initially unresponsive to SLP, though noted to fixate her gaze towards SLP (with her eyes only) intermittently. Pt also noted to grimace after bites of broccoli; suspect an appropriate nonverbal reaction to the taste. SLP assisted pt w/ oral care via suctioning after completion of noon meal. Pt able to grip the suctioning w/ hand over hand assist. When suction was guided to her mouth, an immediate bite reflex was noted. Unlike the previous tx session, she was unable to continue w/ brushing motion after it was initiated by SLP. Throughout oral care, pt appeared to utilize slight head/eye movements to observe the room. SLP then initiated written expression task via name and shape tracing. Pt noted to [facility] grasp the pen w/o bringing it to her mouth and SLP provided hand over  hand assist to trace her name and then a circle. During tracing task, pt initially noted to shift her gaze to the paper intermittently but was unable to sustain her attention halfway through the task and no eye gaze was noted after that point. Pt repositioned in bed and left w/ alarm set/call light within reach. Recommend cont ST per POC.   Pain  Unable to report pain - appeared comfortable.   Therapy/Group: Individual Therapy  [name] 12/01/2022, 5:05 PM

## 2022-12-01 NOTE — Progress Notes (Signed)
Patient up resting in bed. Facial grimacing noted, PRN Tylenol given as ordered. Patient remains non verbal, reposition Q 2 hours and PRN. Appetite  remains good, continue to offer fluids.

## 2022-12-01 NOTE — Plan of Care (Signed)
Progressing towards goals

## 2022-12-01 NOTE — Progress Notes (Signed)
Physical Therapy Session Note  Patient Details  Name: Nicole [name] MRN: [id number] Date of Birth: 10-24-1975  Today's Date: 12/01/2022 PT Individual Time: 0905-1000 PT Individual Time Calculation (min): 55 min   Short Term Goals: Week 2:  PT Short Term Goal 1 (Week 2): Pt will initiate bed mobility but require no more than totalA PT Short Term Goal 2 (Week 2): Pt will maintain sitting balance unsupported for 30 seconds PT Short Term Goal 3 (Week 2): Pt will demonstrate command follow for ~15% of tasks PT Short Term Goal 4 (Week 2): Pt will be assessed for custom wheelchair needs  Skilled Therapeutic Interventions/Progress Updates: Pt presented in bed awake and alert. Upon speaking pt did turn her head to the L briefly. Pt noted to be in gown therefore PTA donned TED hose and threaded pants total A. Performed rolling L/R total A to pull pants over hips. PTA performed gentle stretching to LUE  with notable grimacing initially but then lessened during session. PTA initiated threading shirt and then completed supine to sit total, in sitting PTA completed donning shirt total A. PTA then completed dependent stand pivot transfer to TIS and transported to main gym. In gym completed dependent stand pivot to mat. On level firm surface worked on sitting balance with PTA extending pt's R arm to mat for support. Pt unable to follow commands at this time in regards to correcting balance deficits however PTA was able to position pt to maintain sitting balance with CGA for brief 10 sec bouts. PTA then positioned self behind pt and performed gentle stretching of neck and repositioning of head to more neutral position. Pt with mild but notable stretching noted with activity. Once completed pt transferred back to TIS in same manner as prior and transferred back to room. Remained in TIS at end of session with belt alarm on, restrain glove on, and pillow positioned under pt's RUE.      Therapy Documentation Precautions:   Precautions Precautions: Fall Precaution Comments: Contact precautions & delayed processing Restrictions Weight Bearing Restrictions: No General:    Therapy/Group: Individual Therapy  Jose Corvin 12/01/2022, 4:38 PM

## 2022-12-01 NOTE — Progress Notes (Signed)
Physical Therapy Session Note  Patient Details  Name: Nicole [name] MRN: [id number] Date of Birth: 17-Aug-1975  Today's Date: 12/01/2022 PT Missed Time: 30 Minutes Missed Time Reason: Other (Comment) (sleeping and unable to rouse)  Attempted to see patient for scheduled therapy, but pt was asleep in TIS and unable to be roused.Nsg made aware of pt condition. Pt missed x 30 min of scheduled therapy.   Therapy/Group: Individual Therapy  Juluis Rainier 12/01/2022, 12:38 PM

## 2022-12-01 NOTE — Progress Notes (Signed)
Occupational Therapy Session Note  Patient Details  Name: Nicole [name] MRN: [id number] Date of Birth: [birth year]  Today's Date: 12/01/2022 OT Individual Time: 1302-1402 OT Individual Time Calculation (min): 60 min    Short Term Goals: Week 2:  OT Short Term Goal 1 (Week 2): Patient will visually attend to self in mirror x 3-5 seconds with max cueing as prerequisite to grooming tasks OT Short Term Goal 2 (Week 2): Patient will participate following hand over hand cueing in familiar functional task - e.g. brushing hair, brushing teeth, washing body with RUE and mod assist OT Short Term Goal 3 (Week 2): Patient will sit unsupported x 5 seconds in preparation for level surface transfer OT Short Term Goal 4 (Week 2): Patient will demonstrate localized response (looking toward)to auditory stimulus (name being called) 3 of 5 trials from supported position. OT Short Term Goal 5 (Week 2): Patient will demonstrate improved passive or active assisted elbow range of motion in preparation for assistance with BADL.  Right -5* extension, Left - 15* extension  Skilled Therapeutic Interventions/Progress Updates:  Pt greeted seated in TIS noted to be grimacing but making no efforts to verbalize. Pt transferred back to bed via squat pivot with step placed underneath pts feet d/t kreg bed being tall. Pt required total A +2 for squat pivot back to bed to pts R side. Total A to transition to supine.   Utilized kreg bed to tilt pt into standing position to promote WB'ing through BLEs as precursor to higher level functional mobility skills. BP assessed throughout tilting, of note pt able to rotate neck to L side towards therapist on command.  25*- 109/85( 94) HR 94 40*- 116/86( 96)  51* - 106/89 ( 95 )HR 102 51* after ~ 8 mins in WB position- 116/89  Worked on PROM of BUEs in standing with LUE seeming to present with increased tone vs RUE.   Pt able to tolerate tilting back to supine in slow increments of 10* with  no adverse reactions. Ended session with pts bed in chair position and mother present assisting pt with feeding.   Therapy Documentation Precautions:  Precautions Precautions: Fall Precaution Comments: Contact precautions & delayed processing Restrictions Weight Bearing Restrictions: No  Pain: pt noted to grimace at times, provided repositioning and rest breaks as pain mgmt strategies.     Therapy/Group: Individual Therapy  Barron Schmid 12/01/2022, 3:38 PM

## 2022-12-01 NOTE — Progress Notes (Signed)
Occupational Therapy Weekly Progress Note  Patient Details  Name: Nicole [name] MRN: [id number] Date of Birth: 03-Sep-1975  Beginning of progress report period: November 24, 2022 End of progress report period: December 01, 2022  Today's Date: 12/01/2022  Patient has met 1 of 4 short term goals. Pt continues to be dependent for all ADLs and functional mobility. Pt is responding more to stimuli demonstrating improved localized response with pt turning head to verbal stimulus this week as well as washing her face with initial total A but progressed to supervision. Family looking at SNF options. Will continue OT POC.   Patient continues to demonstrate the following deficits: {impairments:3041632} and therefore will continue to benefit from skilled OT intervention to enhance overall performance with {ADL/iADL:3041649}.  Patient {LTG progression:3041653}.  {plan of ONGE:9528413}  OT Short Term Goals Week 1:  OT Short Term Goal 1 (Week 1): Pt will initiate simple ADL task with Max multimodal cuing. OT Short Term Goal 1 - Progress (Week 1): Not met OT Short Term Goal 2 (Week 1): Pt will attend to ADL task for >2 min in distraction-free environment. OT Short Term Goal 2 - Progress (Week 1): Not met OT Short Term Goal 3 (Week 1): Pt will express preference between two options in preparation for self-advocacy during medical care. OT Short Term Goal 3 - Progress (Week 1): Not met Week 2:  OT Short Term Goal 1 (Week 2): Patient will visually attend to self in mirror x 3-5 seconds with max cueing as prerequisite to grooming tasks OT Short Term Goal 1 - Progress (Week 2): Progressing toward goal OT Short Term Goal 2 (Week 2): Patient will participate following hand over hand cueing in familiar functional task - e.g. brushing hair, brushing teeth, washing body with RUE and mod assist OT Short Term Goal 2 - Progress (Week 2): Met OT Short Term Goal 3 (Week 2): Patient will sit unsupported x 5 seconds in preparation  for level surface transfer OT Short Term Goal 3 - Progress (Week 2): Progressing toward goal OT Short Term Goal 4 (Week 2): Patient will demonstrate localized response (looking toward)to auditory stimulus (name being called) 3 of 5 trials from supported position. OT Short Term Goal 4 - Progress (Week 2): Progressing toward goal OT Short Term Goal 5 (Week 2): Patient will demonstrate improved passive or active assisted elbow range of motion in preparation for assistance with BADL.  Right -5* extension, Left - 15* extension OT Short Term Goal 5 - Progress (Week 2): Progressing toward goal Week 3:  OT Short Term Goal 1 (Week 3): pt will tolerate tilt table to at least 60* for 3 consecutive sessions as precursor to higher level functional mobility tasks OT Short Term Goal 2 (Week 3): pt will respond to verbal stimuli 3/5 trials to improve attention to task OT Short Term Goal 3 (Week 3): pt will maintain static sitting balance EOB with CGA for at least 10 secs as precursor to higher level ADl tasks    Therapy Documentation Precautions:  Precautions Precautions: Fall Precaution Comments: Contact precautions & delayed processing Restrictions Weight Bearing Restrictions: No     Therapy/Group: Individual Therapy  [name] [facility] 12/01/2022, 3:53 PM

## 2022-12-02 MED ORDER — BACLOFEN 5 MG HALF TABLET
15.0000 mg | ORAL_TABLET | Freq: Three times a day (TID) | ORAL | Status: DC
  Administered 2022-12-02 – 2022-12-06 (×11): 15 mg via ORAL
  Filled 2022-12-02 (×11): qty 1

## 2022-12-02 MED ORDER — HYDROCORTISONE 0.5 % EX CREA
TOPICAL_CREAM | Freq: Three times a day (TID) | CUTANEOUS | Status: AC
  Filled 2022-12-02: qty 28.35

## 2022-12-02 NOTE — Progress Notes (Signed)
Physical Therapy Session Note  Patient Details  Name: Nicole [name] MRN: [id number] Date of Birth: 1976-04-05  Today's Date: 12/02/2022 PT Individual Time: 1005-1100 PT Individual Time Calculation (min): 55 min   Short Term Goals: Week 1:  PT Short Term Goal 1 (Week 1): pt will perform sit<>stand with +2 max A PT Short Term Goal 1 - Progress (Week 1): Not met PT Short Term Goal 2 (Week 1): pt will initiate gait training PT Short Term Goal 2 - Progress (Week 1): Not met PT Short Term Goal 3 (Week 1): Pt will perform bed<>chair transfer with +2 max A PT Short Term Goal 3 - Progress (Week 1): Not met Week 2:  PT Short Term Goal 1 (Week 2): Pt will initiate bed mobility but require no more than totalA PT Short Term Goal 2 (Week 2): Pt will maintain sitting balance unsupported for 30 seconds PT Short Term Goal 3 (Week 2): Pt will demonstrate command follow for ~15% of tasks PT Short Term Goal 4 (Week 2): Pt will be assessed for custom wheelchair needs  Skilled Therapeutic Interventions/Progress Updates:   Pt presents in bed and alert. Pt does occasionally make head movements when therapist talking to patient but unable to follow any formal commands throughout session. Focused on dressing at bed level to prepare for OOB with attempts to engage patient in actvities for bed mobility for rolling for and movement in BLE for donning of tedhose and pants. Pt did not initiate except for occasional head turns but unclear if this was actually per command. Total assist for dressing at bed level with +2 for supine to sit. EOB focused on maintaining sitting balacne while donning of shirt and tolerance to movement of UE's as pt maintained in flexed position at elbows. Pt moving R hand in mitten to face throughout session. Required up to total assist for sitting balance EOB during dynamic tasks. TOtal +2 for squat pivot transfers throughout session with emphasis on pt able to tolerate position and maintain anterior  weightshift, as session progressed pt tendency for strong posterior bias and extensor tone noted in BLE despite blocking and positioning for weightbearing. In gym, transferred in same manner and then focused on NMR to address postural control retraining, sitting balance, orientation to midline, and any functional or volitional purposeful movements or responses. Pt with poor tolerance to movement in BUE to attempt to stretch or range to decreased contractures but often grimaces with any attempt. INitially pt able to sit with mod assist statically but as fatigued and increased extension noted in trunk, required total assist. End of session positioned back in TIS w/c with chest strap donned and head support in place. [name] mom reports soft collar still not in room. Will need to follow up if ordered or not.  Therapy Documentation Precautions:  Precautions Precautions: Fall Precaution Comments: Contact precautions & delayed processing Restrictions Weight Bearing Restrictions: No    Pain: Grimaces with certain movements in UE and LE movements. Pt unable to communicate or rate pain level. Rest breaks provided and modified activities for tolerance.     Therapy/Group: Individual Therapy  Karolee Stamps Darrol Poke, PT, DPT, CBIS  12/02/2022, 12:15 PM

## 2022-12-02 NOTE — Progress Notes (Signed)
Occupational Therapy Session Note  Patient Details  Name: Nicole [name] MRN: [id number] Date of Birth: November 10, 1975  Today's Date: 12/02/2022 OT Individual Time: 1331-1404 OT Individual Time Calculation (min): 33 min    Short Term Goals: Week 3:  OT Short Term Goal 1 (Week 3): pt will tolerate tilt table to at least 60* for 3 consecutive sessions as precursor to higher level functional mobility tasks OT Short Term Goal 2 (Week 3): pt will respond to verbal stimuli 3/5 trials to improve attention to task OT Short Term Goal 3 (Week 3): pt will maintain static sitting balance EOB with CGA for at least 10 secs as precursor to higher level ADl tasks  Skilled Therapeutic Interventions/Progress Updates:  Pt received during nursing care for skilled OT session with focus on 1-step activity. Pt unable to communicate presence or intensity of pain. OT offering intermediate rest breaks and positioning suggestions throughout session to address potential pain/fatigue and maximize participation/safety in session.   Pt dependently transported in TIS WC to/from 4N tower for change of scenery and increased natural stimulation from sunlight and outdoor environment. In said environment, pt tasked with simple "put in" activity using squigz to target following 1-step commands. Pt requires Max-Total A to retrieve squigz from lap, transport, and place in container. Pt with increased ability to grasp squigz with R-hand. Pt does track squigz as therapist calls out color less than 20% of the time. [name] positioning in TIS WC adjusted with use of pillow to prop RUE and decrease lateral lean. Pillows provided underneath bilateral feet for skin protection.   Pt remained sitting in TIS WC with all immediate needs met at end of session. Pt continues to be appropriate for skilled OT intervention to promote further functional independence.   Therapy Documentation Precautions:  Precautions Precautions: Fall Precaution Comments:  Contact precautions & delayed processing Restrictions Weight Bearing Restrictions: No   Therapy/Group: Individual Therapy  Lou Cal, OTR/L, MSOT  12/02/2022, 5:46 AM

## 2022-12-02 NOTE — Progress Notes (Signed)
PROGRESS NOTE   Subjective/Complaints:  No events overnight. Vitals stable Last BM 7/19  - incontinent B/B Using tylenol consistently Patient minimally responsive to verbal cue and commands.  Mother at bedside, states she has an itching rash over her posterior scalp and asking for dermatology evaluation or cream to help manage this.  Patient has no prior history of this, psoriasis, or other dermatitis.  Mother asking questions regarding prognosis for functional recovery, stated this is a long process that we will extend over several months however do expect some dynamic changes secondary to injury within the first 4 to 6 weeks, which may account for the fact that she was "doing better cognitively before she came here".  Reviewed chart, apparently decreased and speech is known by the primary team, repeat head imaging was unchanged.  ROS: Limited due to cognitive/behavioral     Objective:   No results found. No results for input(s): "WBC", "HGB", "HCT", "PLT" in the last 72 hours.  Recent Labs    12/01/22 0500  CREATININE 0.56     Intake/Output Summary (Last 24 hours) at 12/02/2022 0946 Last data filed at 12/02/2022 0820 Gross per 24 hour  Intake 548 ml  Output --  Net 548 ml      Pressure Injury 10/31/22 Buttocks Left Unstageable - Full thickness tissue loss in which the base of the injury is covered by slough (yellow, tan, gray, green or brown) and/or eschar (tan, brown or black) in the wound bed. (Active)  10/31/22 2000  Location: Buttocks  Location Orientation: Left  Staging: Unstageable - Full thickness tissue loss in which the base of the injury is covered by slough (yellow, tan, gray, green or brown) and/or eschar (tan, brown or black) in the wound bed.  Wound Description (Comments):   Present on Admission: Yes     Pressure Injury 11/18/22 Toe (Comment  which one) Anterior;Right Deep Tissue Pressure Injury -  Purple or maroon localized area of discolored intact skin or blood-filled blister due to damage of underlying soft tissue from pressure and/or shear. small are (Active)  11/18/22 1300  Location: Toe (Comment  which one)  Location Orientation: Anterior;Right  Staging: Deep Tissue Pressure Injury - Purple or maroon localized area of discolored intact skin or blood-filled blister due to damage of underlying soft tissue from pressure and/or shear.  Wound Description (Comments): small area on right great toe metatarsal  Present on Admission: Yes     Pressure Injury 11/18/22 Toe (Comment  which one) Anterior;Right Stage 1 -  Intact skin with non-blanchable redness of a localized area usually over a bony prominence. small pink area of non blanchable skin on Right pinky toe (Active)  11/18/22 1900  Location: Toe (Comment  which one)  Location Orientation: Anterior;Right  Staging: Stage 1 -  Intact skin with non-blanchable redness of a localized area usually over a bony prominence.  Wound Description (Comments): small pink area of non blanchable skin on Right pinky toe  Present on Admission: Yes     Pressure Injury 11/22/22 Toe (Comment  which one) Anterior;Right Stage 2 -  Partial thickness loss of dermis presenting as a shallow open injury with a red, pink wound  bed without slough. small scratch on dorsal aspect of foot with dark purple brusing ar (Active)  11/22/22 1930  Location: Toe (Comment  which one)  Location Orientation: Anterior;Right  Staging: Stage 2 -  Partial thickness loss of dermis presenting as a shallow open injury with a red, pink wound bed without slough.  Wound Description (Comments): small scratch on dorsal aspect of foot with dark purple brusing around the base of the foot possible medical device related to Ted hose  Present on Admission: No    Physical Exam: Vital Signs Blood pressure 123/77, pulse 73, temperature 98.4 F (36.9 C), temperature source Oral, resp. rate 20,  height 5\' 1"  (1.549 m), SpO2 100%.  Gen: no distress, normal appearing.  Mitt on right hand. HEENT: oral mucosa pink and moist, NCAT.  Scaling rash on posterior scalp. Cardio: Tachycardic Chest: normal effort, normal rate of breathing Abd: soft, non-distended Ext: no edema Psych: pleasant, appears comfortable Skin: Macerated left hand Neurologic: Does not respond to orientation questions, does not track, does not follow cues. Right upper extremity antigravity to scratch head, otherwise not cooperative with exam. Sensory exam unable to assess other than grimace to pinch of bilateral fingers and toes  Tone: MAS 3 right elbow, right knee, and right hip. Cerebellar exam unable to assess  Musculoskeletal: reduced bilateral shoulder and elbow ROM due to increased tone    Assessment/Plan: 1. Functional deficits which require 3+ hours per day of interdisciplinary therapy in a comprehensive inpatient rehab setting. Physiatrist is providing close team supervision and 24 hour management of active medical problems listed below. Physiatrist and rehab team continue to assess barriers to discharge/monitor patient progress toward functional and medical goals  Care Tool:  Bathing        Body parts bathed by helper: Right arm, Left arm, Chest, Abdomen, Front perineal area, Buttocks, Right upper leg, Left upper leg, Right lower leg, Left lower leg, Face     Bathing assist Assist Level: Dependent - Patient 0%     Upper Body Dressing/Undressing Upper body dressing   What is the patient wearing?: Pull over shirt    Upper body assist Assist Level: Dependent - Patient 0%    Lower Body Dressing/Undressing Lower body dressing      What is the patient wearing?: Pants     Lower body assist Assist for lower body dressing: Dependent - Patient 0%     Toileting Toileting Toileting Activity did not occur (Clothing management and hygiene only): N/A (no void or bm)  Toileting assist Assist for  toileting: 2 Helpers     Transfers Chair/bed transfer  Transfers assist  Chair/bed transfer activity did not occur: Safety/medical concerns  Chair/bed transfer assist level: Dependent - Patient 0%     Locomotion Ambulation   Ambulation assist   Ambulation activity did not occur: Safety/medical concerns          Walk 10 feet activity   Assist  Walk 10 feet activity did not occur: Safety/medical concerns        Walk 50 feet activity   Assist Walk 50 feet with 2 turns activity did not occur: Safety/medical concerns         Walk 150 feet activity   Assist Walk 150 feet activity did not occur: Safety/medical concerns         Walk 10 feet on uneven surface  activity   Assist Walk 10 feet on uneven surfaces activity did not occur: Safety/medical concerns  Wheelchair     Assist Is the patient using a wheelchair?: Yes Type of Wheelchair: Manual (tilt in space)    Wheelchair assist level: Dependent - Patient 0%      Wheelchair 50 feet with 2 turns activity    Assist    Wheelchair 50 feet with 2 turns activity did not occur: Safety/medical concerns   Assist Level: Dependent - Patient 0%   Wheelchair 150 feet activity     Assist  Wheelchair 150 feet activity did not occur: Safety/medical concerns   Assist Level: Dependent - Patient 0%   Blood pressure 123/77, pulse 73, temperature 98.4 F (36.9 C), temperature source Oral, resp. rate 20, height 5\' 1"  (1.549 m), SpO2 100%.  Medical Problem List and Plan: 1. 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 Extubated 11/05/2022 Fluctuating level of alertness             -patient may  shower             -ELOS/Goals: min/modA but prognosis is guarded due to severe cognitive deficits goals 28-30d  -Continue CIR therapies including PT, OT, and SLP    Tasia Catchings bed ordered  Mother updated  Palliative care consulted,  discussed home with hospice but patient is not a candidate  MRI brain ordered given decline in speech-->no acute abnormality evident  Observed therapy, discussed disposition with SW, applying for nursing home placement  -discussed with PT and SW applying for nursing homes  -obtaining guardianship discussed with mother, form signed for her  2.  Impaired mobility: continue Lovenox             -antiplatelet therapy: N/A 3. Pain Management: Tylenol as needed 4. Mood/Behavior/Sleep: Provide emotional support             -antipsychotic agents: N/A 5. Neuropsych/cognition: This patient is not capable of making decisions on her own behalf. 6. Left buttock unstageable ulcer.  Continue Medihoney to buttock wound daily cover with foam dressing.  Changing dressing every 3 days or as needed soiling  7. Fluids/Electrolytes/Nutrition: Routine in and outs with follow-up chemistries  -No labs since 7/12, mother inducing overall cognitive decline, order for 7/21.  8.  Cavitary right lower lobe pneumonia likely aspiration pneumonia/MRSA pneumonia.  Continue course of Zyvox as indicated per infectious disease  -No labs since 7/12, mother inducing overall cognitive decline, ordered for 7/21.  9.  History of drug abuse.  Positive cocaine on urine drug screen.  Provide counseling  10.  AKI/hypovolemia and ATN.  Renal function much improved.  Repeat Cr today.    Latest Ref Rng & Units 12/01/2022    5:00 AM 11/24/2022    6:25 AM 11/15/2022    4:23 AM  BMP  Glucose 70 - 99 mg/dL   93   BUN 6 - 20 mg/dL   17   Creatinine 7.82 - 1.00 mg/dL 9.56  2.13  0.86   Sodium 135 - 145 mmol/L   136   Potassium 3.5 - 5.1 mmol/L   3.9   Chloride 98 - 111 mmol/L   102   CO2 22 - 32 mmol/L   24   Calcium 8.9 - 10.3 mg/dL   8.5     11.  Mild transaminitis with rhabdomyolysis.  CK reviewed and normalized  12.  Hypotension.  Improving, d/c midodrine  13. Impaired initiation: decrease amantadine back to 100mg  BID given  tachycardia/HTN  14. Suboptimal vitamin D: continue 1,000U D3 daily  15.  Spasticity: continue baclofen to 10mg  TID, BP reviewed and stable with this increase  -Remains extremely tight, increase baclofen to 15 mg 3 times daily, would consider Botox or Xeomin prior to discharge  16. Bowel and bladder incontinence: continue bowel and bladder program   17. Left hand skin maceration: Eucerin ordered, discussed hand split  18. Tachycardia: decreased amantadine back to 100mg  BID  -Heart rate high normal, monitor. 19. HTN: decrease amantadine to 100mg  BID  -Blood pressure within normal limits, monitor.    12/02/2022    8:12 PM 12/02/2022    4:48 PM 12/02/2022    3:35 PM  Vitals with BMI  Weight   104 lbs 8 oz  BMI   19.75  Systolic 119 131   Diastolic 90 96   Pulse 94 95        LOS: 14 days A FACE TO FACE EVALUATION WAS PERFORMED  Angelina Sheriff 12/02/2022, 9:46 AM

## 2022-12-02 NOTE — Progress Notes (Signed)
Speech Language Pathology Daily Session Note  Patient Details  Name: Nicole [name] MRN: [id number] Date of Birth: 03-31-1976  Today's Date: 12/02/2022 SLP Individual Time: 1430-1455 SLP Individual Time Calculation (min): 25 min  Short Term Goals: Week 2: SLP Short Term Goal 1 (Week 2): Patient will utilize multimodal communication to answer basic yes/no questions with Max A multimodal cues in 25% of opportunities SLP Short Term Goal 2 (Week 2): Patient will follow 1-step commands within a context in 25% of opportunities with Max A multimodal cues. SLP Short Term Goal 3 (Week 2): Patient will vocalize on command in 25% of opportunities with Max A multimodal cues. SLP Short Term Goal 4 (Week 2): Pt will present w/ appropriate response to functional stimuli (i.e. accept spoon, grasp basic item appropriately, response to tactile stimuli)  Skilled Therapeutic Interventions:   Pt was seen for skilled ST intervention targeting cognition. Pt was sitting in WC upon SLP entry. Pt did demonstrated little visual tracking during today's session. SLP attempted several different loud sounds to direct pt's attention to therapist, but was unsuccessful (clapping, varying intonation of voice, whistling). SLP doffed patient mitt for entirety of session. Pt did take sip from water bottle with SLP assistance. She slightly opened mouth when asked if she wanted a sip after seeing water bottle in therapist hand. Pt took 2 sips, but would not grasp bottle. Pt did not follow any 1-step directions given max multimodal cues. Pt fell asleep at the end of session. SLP donned mitt and discontinued tx.   Pt was left in [facility] with chair alarm activated.   Pain Pain Assessment Pain Score: 0-No pain  Therapy/Group: Individual Therapy  Dorena Bodo 12/02/2022, 2:57 PM

## 2022-12-02 NOTE — Plan of Care (Signed)
Patient progressing towards some goals. Remains mute

## 2022-12-03 LAB — COMPREHENSIVE METABOLIC PANEL
ALT: 42 U/L (ref 0–44)
AST: 25 U/L (ref 15–41)
Albumin: 3 g/dL — ABNORMAL LOW (ref 3.5–5.0)
Alkaline Phosphatase: 48 U/L (ref 38–126)
Anion gap: 10 (ref 5–15)
BUN: 19 mg/dL (ref 6–20)
CO2: 24 mmol/L (ref 22–32)
Calcium: 8.9 mg/dL (ref 8.9–10.3)
Chloride: 103 mmol/L (ref 98–111)
Creatinine, Ser: 0.57 mg/dL (ref 0.44–1.00)
GFR, Estimated: >60 mL/min (ref >60)
Glucose, Bld: 92 mg/dL (ref 70–99)
Potassium: 3.7 mmol/L (ref 3.5–5.1)
Sodium: 137 mmol/L (ref 135–145)
Total Bilirubin: 0.6 mg/dL (ref 0.3–1.2)
Total Protein: 7.3 g/dL (ref 6.5–8.1)

## 2022-12-03 LAB — CBC WITH DIFFERENTIAL/PLATELET
Abs Immature Granulocytes: 0.04 10*3/uL (ref 0.00–0.07)
Basophils Absolute: 0.1 10*3/uL (ref 0.0–0.1)
Basophils Relative: 1 %
Eosinophils Absolute: 0.1 10*3/uL (ref 0.0–0.5)
Eosinophils Relative: 1 %
HCT: 39.7 % (ref 36.0–46.0)
Hemoglobin: 13.4 g/dL (ref 12.0–15.0)
Immature Granulocytes: 1 %
Lymphocytes Relative: 29 %
Lymphs Abs: 2.5 10*3/uL (ref 0.7–4.0)
MCH: 33.1 pg (ref 26.0–34.0)
MCHC: 33.8 g/dL (ref 30.0–36.0)
MCV: 98 fL (ref 80.0–100.0)
Monocytes Absolute: 0.7 10*3/uL (ref 0.1–1.0)
Monocytes Relative: 8 %
Neutro Abs: 5.3 10*3/uL (ref 1.7–7.7)
Neutrophils Relative %: 60 %
Platelets: 218 10*3/uL (ref 150–400)
RBC: 4.05 MIL/uL (ref 3.87–5.11)
RDW: 14.5 % (ref 11.5–15.5)
WBC: 8.7 10*3/uL (ref 4.0–10.5)
nRBC: 0 % (ref 0.0–0.2)

## 2022-12-03 NOTE — Progress Notes (Signed)
Speech Language Pathology Daily Session Note  Patient Details  Name: Nicole [name] MRN: [id number] Date of Birth: Jun 27, 1975  Today's Date: 12/03/2022 SLP Individual Time: 8295-6213 SLP Individual Time Calculation (min): 10 min and Today's Date: 12/03/2022 SLP Missed Time: 50 Minutes Missed Time Reason: Patient fatigue  Short Term Goals: Week 2: SLP Short Term Goal 1 (Week 2): Patient will utilize multimodal communication to answer basic yes/no questions with Max A multimodal cues in 25% of opportunities SLP Short Term Goal 2 (Week 2): Patient will follow 1-step commands within a context in 25% of opportunities with Max A multimodal cues. SLP Short Term Goal 3 (Week 2): Patient will vocalize on command in 25% of opportunities with Max A multimodal cues. SLP Short Term Goal 4 (Week 2): Pt will present w/ appropriate response to functional stimuli (i.e. accept spoon, grasp basic item appropriately, response to tactile stimuli)  Skilled Therapeutic Interventions:   Pt seen for skilled SLP session to address language goals. Pt alert upon arrival and gazing towards window on R side. She did not attempt to make eye contact with SLP despite max cues. She did attempt to follow one command for lingual protrusion; otherwise, unable to follow any other commands. Attempted breakfast tray with pt; however, she quickly fell asleep and did not open eyes despite max stimulation. Pt left sitting upright in bed with lights on to encourage appropriate sleep/wake cycle. Continue SLP PoC. Pt missed 50 minutes of SLP session due to lethargy.   Pain Pain Assessment Pain Scale: 0-10 Pain Score: 0-No pain  Therapy/Group: Individual Therapy  Ellery Plunk 12/03/2022, 7:51 AM

## 2022-12-03 NOTE — Plan of Care (Signed)
°  Problem: RH SAFETY Goal: RH STG ADHERE TO SAFETY PRECAUTIONS W/ASSISTANCE/DEVICE Description: STG Adhere to Safety Precautions With cues Assistance/Device. Outcome: Progressing   

## 2022-12-03 NOTE — Progress Notes (Signed)
PROGRESS NOTE   Subjective/Complaints:  No events overnight. Vitals stable Last BM 7/19  - incontinent B/B Patient asleep on entering room; localizes to pain in all extremities and grimmaces with ROM but will not open eyes or respond to commands. Reviewed therapy and nursing notes, has been intermittently alert and rarely responsive to cues/commands.   ROS: Limited due to cognitive/behavioral     Objective:   No results found. Recent Labs    12/03/22 0718  WBC 8.7  HGB 13.4  HCT 39.7  PLT 218    Recent Labs    12/01/22 0500 12/03/22 0718  NA  --  137  K  --  3.7  CL  --  103  CO2  --  24  GLUCOSE  --  92  BUN  --  19  CREATININE 0.56 0.57  CALCIUM  --  8.9     Intake/Output Summary (Last 24 hours) at 12/03/2022 2057 Last data filed at 12/03/2022 1900 Gross per 24 hour  Intake 1005 ml  Output --  Net 1005 ml      Pressure Injury 10/31/22 Buttocks Left Unstageable - Full thickness tissue loss in which the base of the injury is covered by slough (yellow, tan, gray, green or brown) and/or eschar (tan, brown or black) in the wound bed. (Active)  10/31/22 2000  Location: Buttocks  Location Orientation: Left  Staging: Unstageable - Full thickness tissue loss in which the base of the injury is covered by slough (yellow, tan, gray, green or brown) and/or eschar (tan, brown or black) in the wound bed.  Wound Description (Comments):   Present on Admission: Yes     Pressure Injury 11/18/22 Toe (Comment  which one) Anterior;Right Deep Tissue Pressure Injury - Purple or maroon localized area of discolored intact skin or blood-filled blister due to damage of underlying soft tissue from pressure and/or shear. small are (Active)  11/18/22 1300  Location: Toe (Comment  which one)  Location Orientation: Anterior;Right  Staging: Deep Tissue Pressure Injury - Purple or maroon localized area of discolored intact skin or  blood-filled blister due to damage of underlying soft tissue from pressure and/or shear.  Wound Description (Comments): small area on right great toe metatarsal  Present on Admission: Yes     Pressure Injury 11/18/22 Toe (Comment  which one) Anterior;Right Stage 1 -  Intact skin with non-blanchable redness of a localized area usually over a bony prominence. small pink area of non blanchable skin on Right pinky toe (Active)  11/18/22 1900  Location: Toe (Comment  which one)  Location Orientation: Anterior;Right  Staging: Stage 1 -  Intact skin with non-blanchable redness of a localized area usually over a bony prominence.  Wound Description (Comments): small pink area of non blanchable skin on Right pinky toe  Present on Admission: Yes     Pressure Injury 11/22/22 Toe (Comment  which one) Anterior;Right Stage 2 -  Partial thickness loss of dermis presenting as a shallow open injury with a red, pink wound bed without slough. small scratch on dorsal aspect of foot with dark purple brusing ar (Active)  11/22/22 1930  Location: Toe (Comment  which one)  Location  Orientation: Anterior;Right  Staging: Stage 2 -  Partial thickness loss of dermis presenting as a shallow open injury with a red, pink wound bed without slough.  Wound Description (Comments): small scratch on dorsal aspect of foot with dark purple brusing around the base of the foot possible medical device related to Ted hose  Present on Admission: No    Physical Exam: Vital Signs Blood pressure (!) 160/87, pulse 96, temperature 97.7 F (36.5 C), resp. rate (!) 22, height 5\' 1"  (1.549 m), weight 47.4 kg, SpO2 98%.  Gen: no distress, normal appearing. laying in bed. Mitt on right hand. HEENT: oral mucosa pink and moist, NCAT.  Scaling rash on posterior scalp. Cardio: Tachycardic Chest: normal effort, normal rate of breathing Abd: soft, non-distended Ext: no edema Psych: unable to assess Skin: Macerated left hand - healing  +  Scaling dried skin on head - stable  Neurologic: Does not respond to orientation questions, does not track, does not follow cues. Right upper extremity antigravity to scratch head, otherwise not cooperative with exam. Sensory exam unable to assess other than grimace to pinch of bilateral fingers and toes  Tone: MAS 3 right elbow, right knee, and right hip; MAS 3 L elbow otherwise resists ROM Cerebellar exam unable to assess  Musculoskeletal: reduced bilateral shoulder and elbow ROM due to increased tone    Assessment/Plan: 1. Functional deficits which require 3+ hours per day of interdisciplinary therapy in a comprehensive inpatient rehab setting. Physiatrist is providing close team supervision and 24 hour management of active medical problems listed below. Physiatrist and rehab team continue to assess barriers to discharge/monitor patient progress toward functional and medical goals  Care Tool:  Bathing        Body parts bathed by helper: Right arm, Left arm, Chest, Abdomen, Front perineal area, Buttocks, Right upper leg, Left upper leg, Right lower leg, Left lower leg, Face     Bathing assist Assist Level: Dependent - Patient 0%     Upper Body Dressing/Undressing Upper body dressing   What is the patient wearing?: Pull over shirt    Upper body assist Assist Level: Dependent - Patient 0%    Lower Body Dressing/Undressing Lower body dressing      What is the patient wearing?: Pants     Lower body assist Assist for lower body dressing: Dependent - Patient 0%     Toileting Toileting Toileting Activity did not occur (Clothing management and hygiene only): N/A (no void or bm)  Toileting assist Assist for toileting: 2 Helpers     Transfers Chair/bed transfer  Transfers assist  Chair/bed transfer activity did not occur: Safety/medical concerns  Chair/bed transfer assist level: 2 Helpers     Locomotion Ambulation   Ambulation assist   Ambulation activity did  not occur: Safety/medical concerns          Walk 10 feet activity   Assist  Walk 10 feet activity did not occur: Safety/medical concerns        Walk 50 feet activity   Assist Walk 50 feet with 2 turns activity did not occur: Safety/medical concerns         Walk 150 feet activity   Assist Walk 150 feet activity did not occur: Safety/medical concerns         Walk 10 feet on uneven surface  activity   Assist Walk 10 feet on uneven surfaces activity did not occur: Safety/medical concerns         Wheelchair  Assist Is the patient using a wheelchair?: Yes Type of Wheelchair: Manual (tilt in space)    Wheelchair assist level: Dependent - Patient 0%      Wheelchair 50 feet with 2 turns activity    Assist    Wheelchair 50 feet with 2 turns activity did not occur: Safety/medical concerns   Assist Level: Dependent - Patient 0%   Wheelchair 150 feet activity     Assist  Wheelchair 150 feet activity did not occur: Safety/medical concerns   Assist Level: Dependent - Patient 0%   Blood pressure (!) 160/87, pulse 96, temperature 97.7 F (36.5 C), resp. rate (!) 22, height 5\' 1"  (1.549 m), weight 47.4 kg, SpO2 98%.  Medical Problem List and Plan: 1. 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 Extubated 11/05/2022 Fluctuating level of alertness             -patient may  shower             -ELOS/Goals: min/modA but prognosis is guarded due to severe cognitive deficits goals 28-30d  -Continue CIR therapies including PT, OT, and SLP    Tasia Catchings bed ordered  Mother updated  Palliative care consulted, discussed home with hospice but patient is not a candidate  MRI brain ordered given decline in speech-->no acute abnormality evident  Observed therapy, discussed disposition with SW, applying for nursing home placement  -discussed with PT and SW applying for nursing homes  -obtaining  guardianship discussed with mother, form signed for her  2.  Impaired mobility: continue Lovenox             -antiplatelet therapy: N/A 3. Pain Management: Tylenol as needed 4. Mood/Behavior/Sleep: Provide emotional support             -antipsychotic agents: N/A  5. Neuropsych/cognition: This patient is not capable of making decisions on her own behalf. 6. Left buttock unstageable ulcer.  Continue Medihoney to buttock wound daily cover with foam dressing.  Changing dressing every 3 days or as needed soiling   - hydrocortisone cream to scalp for rash 7/20  7. Fluids/Electrolytes/Nutrition: Routine in and outs with follow-up chemistries  -Labs 7/21 stable.  8.  Cavitary right lower lobe pneumonia likely aspiration pneumonia/MRSA pneumonia.  Continue course of Zyvox as indicated per infectious disease  -Labs 7/21 stable.  9.  History of drug abuse.  Positive cocaine on urine drug screen.  Provide counseling  10.  AKI/hypovolemia and ATN.  Renal function much improved.  Repeat Cr today.    Latest Ref Rng & Units 12/03/2022    7:18 AM 12/01/2022    5:00 AM 11/24/2022    6:25 AM  BMP  Glucose 70 - 99 mg/dL 92     BUN 6 - 20 mg/dL 19     Creatinine 9.51 - 1.00 mg/dL 8.84  1.66  0.63   Sodium 135 - 145 mmol/L 137     Potassium 3.5 - 5.1 mmol/L 3.7     Chloride 98 - 111 mmol/L 103     CO2 22 - 32 mmol/L 24     Calcium 8.9 - 10.3 mg/dL 8.9       11.  Mild transaminitis with rhabdomyolysis.  CK reviewed and normalized  12.  Hypotension.  Improving, d/c midodrine  13. Impaired initiation: decrease amantadine back to 100mg  BID given tachycardia/HTN  14. Suboptimal vitamin D: continue 1,000U D3 daily  15. Spasticity: continue baclofen to 10mg  TID, BP reviewed and stable with  this increase  -Remains extremely tight, increase baclofen to 15 mg 3 times daily, would consider Botox or Xeomin prior to discharge  16. Bowel and bladder incontinence: continue bowel and bladder program   17.  Left hand skin maceration: Eucerin ordered, discussed hand split  18. Tachycardia: decreased amantadine back to 100mg  BID  -Heart rate high normal, monitor. 19. HTN: decrease amantadine to 100mg  BID  -Blood pressure within normal limits, monitor.    12/03/2022   12:57 PM 12/03/2022    3:53 AM 12/02/2022    8:12 PM  Vitals with BMI  Systolic 160 129 540  Diastolic 87 89 90  Pulse 96 71 94       LOS: 15 days A FACE TO FACE EVALUATION WAS PERFORMED  Angelina Sheriff 12/03/2022, 8:57 PM

## 2022-12-04 NOTE — Progress Notes (Signed)
PROGRESS NOTE   Subjective/Complaints: No new complaints this morning Appears sleepy Had shower with OT and OT states this always tires her.   ROS: Limited due to cognitive/behavioral     Objective:   No results found. Recent Labs    12/03/22 0718  WBC 8.7  HGB 13.4  HCT 39.7  PLT 218    Recent Labs    12/03/22 0718  NA 137  K 3.7  CL 103  CO2 24  GLUCOSE 92  BUN 19  CREATININE 0.57  CALCIUM 8.9     Intake/Output Summary (Last 24 hours) at 12/04/2022 1243 Last data filed at 12/04/2022 1308 Gross per 24 hour  Intake 1225 ml  Output --  Net 1225 ml      Pressure Injury 10/31/22 Buttocks Left Unstageable - Full thickness tissue loss in which the base of the injury is covered by slough (yellow, tan, gray, green or brown) and/or eschar (tan, brown or black) in the wound bed. (Active)  10/31/22 2000  Location: Buttocks  Location Orientation: Left  Staging: Unstageable - Full thickness tissue loss in which the base of the injury is covered by slough (yellow, tan, gray, green or brown) and/or eschar (tan, brown or black) in the wound bed.  Wound Description (Comments):   Present on Admission: Yes     Pressure Injury 11/18/22 Toe (Comment  which one) Anterior;Right Deep Tissue Pressure Injury - Purple or maroon localized area of discolored intact skin or blood-filled blister due to damage of underlying soft tissue from pressure and/or shear. small are (Active)  11/18/22 1300  Location: Toe (Comment  which one)  Location Orientation: Anterior;Right  Staging: Deep Tissue Pressure Injury - Purple or maroon localized area of discolored intact skin or blood-filled blister due to damage of underlying soft tissue from pressure and/or shear.  Wound Description (Comments): small area on right great toe metatarsal  Present on Admission: Yes     Pressure Injury 11/18/22 Toe (Comment  which one) Anterior;Right Stage 1 -   Intact skin with non-blanchable redness of a localized area usually over a bony prominence. small pink area of non blanchable skin on Right pinky toe (Active)  11/18/22 1900  Location: Toe (Comment  which one)  Location Orientation: Anterior;Right  Staging: Stage 1 -  Intact skin with non-blanchable redness of a localized area usually over a bony prominence.  Wound Description (Comments): small pink area of non blanchable skin on Right pinky toe  Present on Admission: Yes     Pressure Injury 11/22/22 Toe (Comment  which one) Anterior;Right Stage 2 -  Partial thickness loss of dermis presenting as a shallow open injury with a red, pink wound bed without slough. small scratch on dorsal aspect of foot with dark purple brusing ar (Active)  11/22/22 1930  Location: Toe (Comment  which one)  Location Orientation: Anterior;Right  Staging: Stage 2 -  Partial thickness loss of dermis presenting as a shallow open injury with a red, pink wound bed without slough.  Wound Description (Comments): small scratch on dorsal aspect of foot with dark purple brusing around the base of the foot possible medical device related to [facility] hose  Present  on Admission: No    Physical Exam: Vital Signs Blood pressure 110/65, pulse 90, temperature 98.3 F (36.8 C), temperature source Axillary, resp. rate 18, height 5\' 1"  (1.549 m), weight 47.4 kg, SpO2 100%.  Gen: no distress, normal appearing. laying in bed. Mitt on right hand. HEENT: oral mucosa pink and moist, NCAT.  Scaling rash on posterior scalp. Cardio: Tachycardic Chest: normal effort, normal rate of breathing Abd: soft, non-distended Ext: no edema Psych: unable to assess Skin: Macerated left hand - healing  + Scaling dried skin on head - stable, stage 2 to right toe  Neurologic: Does not respond to orientation questions, does not track, does not follow cues. Right upper extremity antigravity to scratch head, otherwise not cooperative with exam. Sensory  exam unable to assess other than grimace to pinch of bilateral fingers and toes  Tone: MAS 3 right elbow, right knee, and right hip; MAS 3 L elbow otherwise resists ROM Cerebellar exam unable to assess  Musculoskeletal: reduced bilateral shoulder and elbow ROM due to increased tone    Assessment/Plan: 1. Functional deficits which require 3+ hours per day of interdisciplinary therapy in a comprehensive inpatient rehab setting. Physiatrist is providing close team supervision and 24 hour management of active medical problems listed below. Physiatrist and rehab team continue to assess barriers to discharge/monitor patient progress toward functional and medical goals  Care Tool:  Bathing    Body parts bathed by patient: Chest, Face   Body parts bathed by helper: Right arm, Left arm, Chest, Abdomen, Front perineal area, Buttocks, Right upper leg, Left upper leg, Right lower leg, Left lower leg, Face Body parts n/a: Right arm, Right upper leg, Left arm, Left upper leg, Chest, Abdomen, Right lower leg, Front perineal area, Left lower leg, Buttocks, Face   Bathing assist Assist Level: Dependent - Patient 0%     Upper Body Dressing/Undressing Upper body dressing   What is the patient wearing?: Pull over shirt    Upper body assist Assist Level: Dependent - Patient 0%    Lower Body Dressing/Undressing Lower body dressing      What is the patient wearing?: Pants, Incontinence brief     Lower body assist Assist for lower body dressing: 2 Helpers     Toileting Toileting Toileting Activity did not occur (Clothing management and hygiene only): N/A (no void or bm)  Toileting assist Assist for toileting: Dependent - Patient 0%     Transfers Chair/bed transfer  Transfers assist  Chair/bed transfer activity did not occur: Safety/medical concerns  Chair/bed transfer assist level: Maximal Assistance - Patient 25 - 49%     Locomotion Ambulation   Ambulation assist   Ambulation  activity did not occur: Safety/medical concerns          Walk 10 feet activity   Assist  Walk 10 feet activity did not occur: Safety/medical concerns        Walk 50 feet activity   Assist Walk 50 feet with 2 turns activity did not occur: Safety/medical concerns         Walk 150 feet activity   Assist Walk 150 feet activity did not occur: Safety/medical concerns         Walk 10 feet on uneven surface  activity   Assist Walk 10 feet on uneven surfaces activity did not occur: Safety/medical concerns         Wheelchair     Assist Is the patient using a wheelchair?: Yes Type of Wheelchair: Manual (tilt in space)  Wheelchair assist level: Dependent - Patient 0%      Wheelchair 50 feet with 2 turns activity    Assist    Wheelchair 50 feet with 2 turns activity did not occur: Safety/medical concerns   Assist Level: Dependent - Patient 0%   Wheelchair 150 feet activity     Assist  Wheelchair 150 feet activity did not occur: Safety/medical concerns   Assist Level: Dependent - Patient 0%   Blood pressure 110/65, pulse 90, temperature 98.3 F (36.8 C), temperature source Axillary, resp. rate 18, height 5\' 1"  (1.549 m), weight 47.4 kg, SpO2 100%.  Medical Problem List and Plan: 1. 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 Extubated 11/05/2022 Fluctuating level of alertness             -patient may  shower             -ELOS/Goals: min/modA but prognosis is guarded due to severe cognitive deficits goals 28-30d  -Continue CIR therapies including PT, OT, and SLP    Tasia Catchings bed ordered  Mother updated  Palliative care consulted, discussed home with hospice but patient is not a candidate  MRI brain ordered given decline in speech-->no acute abnormality evident  Observed therapy, discussed disposition with SW, applying for nursing home placement  -discussed with PT and SW applying  for nursing homes  -obtaining guardianship discussed with mother, form signed for her  2.  Impaired mobility: continue Lovenox             -antiplatelet therapy: N/A 3. Pain Management: Tylenol as needed 4. Mood/Behavior/Sleep: Provide emotional support             -antipsychotic agents: N/A  5. Neuropsych/cognition: This patient is not capable of making decisions on her own behalf. 6. Left buttock unstageable ulcer.  Continue Medihoney to buttock wound daily cover with foam dressing.  Changing dressing every 3 days or as needed soiling   - hydrocortisone cream to scalp for rash 7/20  7. Fluids/Electrolytes/Nutrition: Routine in and outs with follow-up chemistries  -Labs 7/21 stable.  8.  Cavitary right lower lobe pneumonia likely aspiration pneumonia/MRSA pneumonia.  Continue course of Zyvox as indicated per infectious disease  -Labs 7/21 stable.  9.  History of drug abuse.  Positive cocaine on urine drug screen.  Provide counseling  10.  AKI/hypovolemia and ATN.  Renal function much improved.  Repeat Cr today.    Latest Ref Rng & Units 12/03/2022    7:18 AM 12/01/2022    5:00 AM 11/24/2022    6:25 AM  BMP  Glucose 70 - 99 mg/dL 92     BUN 6 - 20 mg/dL 19     Creatinine 0.10 - 1.00 mg/dL 2.72  5.36  6.44   Sodium 135 - 145 mmol/L 137     Potassium 3.5 - 5.1 mmol/L 3.7     Chloride 98 - 111 mmol/L 103     CO2 22 - 32 mmol/L 24     Calcium 8.9 - 10.3 mg/dL 8.9       11.  Mild transaminitis with rhabdomyolysis.  CK reviewed and normalized  12.  Hypotension.  Improving, d/c midodrine  13. Impaired initiation: decrease amantadine back to 100mg  BID given tachycardia/HTN  14. Suboptimal vitamin D: continue 1,000U D3 daily  15. Spasticity:   -Remains extremely tight, increase baclofen to 15 mg 3 times daily. Appears to have increased sedation with this increase, continue to monitor  16. Bowel  and bladder incontinence: continue bowel and bladder program   17. Left hand skin  maceration: Eucerin ordered, discussed hand split with OT  18. Tachycardia: decreased amantadine back to 100mg  BID  -Heart rate high normal, monitor. 19. HTN: decrease amantadine to 100mg  BID. BP reviewed and has stabilized    12/04/2022    4:30 AM 12/03/2022    9:15 PM 12/03/2022   12:57 PM  Vitals with BMI  Systolic 110 116 865  Diastolic 65 79 87  Pulse 90 91 96      LOS: 16 days A FACE TO FACE EVALUATION WAS PERFORMED  Judas Mohammad P Marybel Alcott 12/04/2022, 12:43 PM

## 2022-12-04 NOTE — Progress Notes (Signed)
Speech Language Pathology Weekly Progress and Session Note  Patient Details  Name: Nicole [name] MRN: [id number] Date of Birth: 1975/09/03  Beginning of progress report period: November 27, 2022 End of progress report period: December 04, 2022  Today's Date: 12/04/2022 SLP Individual Time: 1400-1445 SLP Individual Time Calculation (min): 45 min  Short Term Goals: Week 2: SLP Short Term Goal 1 (Week 2): Patient will utilize multimodal communication to answer basic yes/no questions with Max A multimodal cues in 25% of opportunities SLP Short Term Goal 1 - Progress (Week 2): Not met SLP Short Term Goal 2 (Week 2): Patient will follow 1-step commands within a context in 25% of opportunities with Max A multimodal cues. SLP Short Term Goal 2 - Progress (Week 2): Not met SLP Short Term Goal 3 (Week 2): Patient will vocalize on command in 25% of opportunities with Max A multimodal cues. SLP Short Term Goal 3 - Progress (Week 2): Discontinued (comment) SLP Short Term Goal 4 (Week 2): Pt will present w/ appropriate response to functional stimuli (i.e. accept spoon, grasp basic item appropriately, response to tactile stimuli) SLP Short Term Goal 4 - Progress (Week 2): Not met    New Short Term Goals: Week 3: SLP Short Term Goal 1 (Week 3): Patient will utilize multimodal communication to answer basic yes/no questions with Max A multimodal cues in 10% of opportunities SLP Short Term Goal 2 (Week 3): Patient will follow 1-step commands within a context in 10% of opportunities with Max A multimodal cues. SLP Short Term Goal 3 (Week 3): Pt will present w/ appropriate response to functional stimuli (i.e. accept spoon, grasp basic item appropriately, response to tactile stimuli) 10% of the time w/ maxA multimodal cues  Weekly Progress Updates:  Pt demonstrates limited progress towards ST goals, as she met 0/4 goals this week. Vocalization goal discontinued at this time 2* severity of her deficits. She continues to  demonstrate profound cognitive-linguistic deficits and would benefit from continued ST services to target focused attention, following directions, and communication of wants/needs. Unable to establish nonverbal communication system at this time, though will continue to target to maximize pt independence and reduce caregiver burden. Family education ongoing at this time. She continues to tolerate a Dys 3 diet and thin liquids but will continue to require full supervision d/t self feeding and cognitive deficits.    Intensity: Minumum of 1-2 x/day, 30 to 90 minutes Frequency: 3 to 5 out of 7 days Duration/Length of Stay: 1 week Treatment/Interventions: Cognitive remediation/compensation;Dysphagia/aspiration precaution training;Internal/external aids;Speech/Language facilitation;Cueing hierarchy;Environmental controls;Therapeutic Activities;Functional tasks;Multimodal communication approach;Patient/family education;Therapeutic Exercise;DME/adaptive equipment instruction   Daily Session  Skilled Therapeutic Interventions: Pt greeted in her room. She was reclined in her TIS chair upon SLP arrival.       Pain Pain Assessment Faces Pain Scale: No hurt  Therapy/Group: Individual Therapy  [name] 12/04/2022, 4:15 PM

## 2022-12-04 NOTE — Progress Notes (Signed)
Occupational Therapy Session Note  Patient Details  Name: Nicole [name] MRN: [id number] Date of Birth: 06/24/1975  Today's Date: 12/04/2022 OT Individual Time: 0800-0920 OT Individual Time Calculation (min): 80 min    Short Term Goals: Week 2:  OT Short Term Goal 1 (Week 2): Patient will visually attend to self in mirror x 3-5 seconds with max cueing as prerequisite to grooming tasks OT Short Term Goal 1 - Progress (Week 2): Progressing toward goal OT Short Term Goal 2 (Week 2): Patient will participate following hand over hand cueing in familiar functional task - e.g. brushing hair, brushing teeth, washing body with RUE and mod assist OT Short Term Goal 2 - Progress (Week 2): Met OT Short Term Goal 3 (Week 2): Patient will sit unsupported x 5 seconds in preparation for level surface transfer OT Short Term Goal 3 - Progress (Week 2): Progressing toward goal OT Short Term Goal 4 (Week 2): Patient will demonstrate localized response (looking toward)to auditory stimulus (name being called) 3 of 5 trials from supported position. OT Short Term Goal 4 - Progress (Week 2): Progressing toward goal OT Short Term Goal 5 (Week 2): Patient will demonstrate improved passive or active assisted elbow range of motion in preparation for assistance with BADL.  Right -5* extension, Left - 15* extension OT Short Term Goal 5 - Progress (Week 2): Progressing toward goal  Skilled Therapeutic Interventions/Progress Updates:    Patient received supine in bed. Patient turns eyes to her name being called - does not sustain eye contact for more than seconds.  Assisted to roll and to sit at edge of bed.  Worked briefly on sitting balance at edge of bed.  Patient falling posteriorly and to right this am.  Assisted to rolling shower chair.  Sit to stand to remove brief with second caregiver.  Patient immediately moved hand to hair when placed into shower/ running water.  Assisted to wash face, chest following hand over hand  guidance initially.  Transferred to wheelchair for grooming, dressing and self feeding.  Patient showing slight improvement once object placed in hand - using object with initial guiding - brush for hair, toothbrush, fork for hand to mouth.   Patient very fatigued again after shower.  Left up in wheelchair in minimal tilt for increased head support, and safety belt in place and engaged.    Therapy Documentation Precautions:  Precautions Precautions: Fall Precaution Comments: Contact precautions & delayed processing Restrictions Weight Bearing Restrictions: No  Pain: Winces with passive range to L elbow/ shoulder - non verbal, unable to rate    Therapy/Group: Individual Therapy  [name] 12/04/2022, 12:13 PM

## 2022-12-04 NOTE — Progress Notes (Signed)
Physical Therapy Session Note  Patient Details  Name: Nicole [name] MRN: [id number] Date of Birth: [birth year]  Today's Date: 12/04/2022 PT Individual Time: 1103-1213 PT Individual Time Calculation (min): 70 min   Short Term Goals: Week 1:  PT Short Term Goal 1 (Week 1): pt will perform sit<>stand with +2 max A PT Short Term Goal 1 - Progress (Week 1): Not met PT Short Term Goal 2 (Week 1): pt will initiate gait training PT Short Term Goal 2 - Progress (Week 1): Not met PT Short Term Goal 3 (Week 1): Pt will perform bed<>chair transfer with +2 max A PT Short Term Goal 3 - Progress (Week 1): Not met Week 2:  PT Short Term Goal 1 (Week 2): Pt will initiate bed mobility but require no more than totalA PT Short Term Goal 2 (Week 2): Pt will maintain sitting balance unsupported for 30 seconds PT Short Term Goal 3 (Week 2): Pt will demonstrate command follow for ~15% of tasks PT Short Term Goal 4 (Week 2): Pt will be assessed for custom wheelchair needs  Skilled Therapeutic Interventions/Progress Updates:  Patient seated in TIS w/c with significant back tilt and arms propped with pillows on entrance to room. L hand splint and R hand in safety mitt. Doffed for session and re-donned just prior to lunch. Patient alert and agreeable to PT session.   Patient with no pain complaint at start of session.  Therapeutic Activity: Bed Mobility: Pt performed supine <> sit with ***. VC/ tc required for ***. Transfers: Pt performed sit<>stand and stand pivot transfers throughout session with ***. Provided verbal cues for***.  Neuromuscular Re-ed: NMR facilitated during session with focus on***. Pt guided in ***. NMR performed for improvements in motor control and coordination, balance, sequencing, judgement, and self confidence/ efficacy in performing all aspects of mobility at highest level of independence.   Patient *** at end of session with brakes locked, *** alarm set, and all needs within reach.  -  stretch of arms and hands to reduce tone -   Therapy Documentation Precautions:  Precautions Precautions: Fall Precaution Comments: Contact precautions & delayed processing Restrictions Weight Bearing Restrictions: No General:   Vital Signs:   Pain:  Indication of discomfort with PROM into R and LUE elbow extension as well as PROM of head into cervical extension to reach neutral posture.    Therapy/Group: Individual Therapy  Loel Dubonnet PT, DPT, CSRS 12/04/2022, 12:25 PM

## 2022-12-04 NOTE — Progress Notes (Signed)
Occupational Therapy Session Note  Patient Details  Name: Nicole [name] MRN: [id number] Date of Birth: [birth year]  Today's Date: 12/04/2022 OT Individual Time: 1500-1540 OT Individual Time Calculation (min): 40 min    Short Term Goals: Week 3:  OT Short Term Goal 1 (Week 3): pt will tolerate tilt table to at least 60* for 3 consecutive sessions as precursor to higher level functional mobility tasks OT Short Term Goal 2 (Week 3): pt will respond to verbal stimuli 3/5 trials to improve attention to task OT Short Term Goal 3 (Week 3): pt will maintain static sitting balance EOB with CGA for at least 10 secs as precursor to higher level ADl tasks  Skilled Therapeutic Interventions/Progress Updates:   Pt TIS level with need for brief change and re- positioning. TT for + 2 present. OT transferred via lateral squat pivot to EOB with feet supported by 6" step stool. EOB for oral care with set up and min A with trunk support posteriorly and upright head facilitation. Music for attention and arousal. Pt positioned side lying for hygiene with + BM and urine incontinence. Nursing called for skin/wound care as pt with sacral pu. Dependent care for clean brief and hygiene. Left in care of nursing for heel protection and sidelying for skin protection.   Pain: grimaced for neck ROM otherwise no s/s of pain   Therapy Documentation Precautions:  Precautions Precautions: Fall Precaution Comments: Contact precautions & delayed processing Restrictions Weight Bearing Restrictions: No    Therapy/Group: Individual Therapy  Vicenta Dunning 12/04/2022, 7:42 AM

## 2022-12-05 ENCOUNTER — Ambulatory Visit: Payer: MEDICAID | Admitting: Internal Medicine

## 2022-12-05 MED ORDER — SELENIUM SULFIDE 1 % EX LOTN
TOPICAL_LOTION | Freq: Every day | CUTANEOUS | Status: DC
  Filled 2022-12-05: qty 207

## 2022-12-05 NOTE — Plan of Care (Signed)
  Problem: Consults Goal: RH STROKE PATIENT EDUCATION Description: See Patient Education module for education specifics  Outcome: Progressing   Problem: RH BOWEL ELIMINATION Goal: RH STG MANAGE BOWEL WITH ASSISTANCE Description: STG Manage Bowel with mod I Assistance. Outcome: Progressing Goal: RH STG MANAGE BOWEL W/MEDICATION W/ASSISTANCE Description: STG Manage Bowel with Medication with mod I Assistance. Outcome: Progressing   Problem: RH BLADDER ELIMINATION Goal: RH STG MANAGE BLADDER WITH ASSISTANCE Description: STG Manage Bladder With toileting Assistance Outcome: Progressing   Problem: RH SKIN INTEGRITY Goal: RH STG SKIN FREE OF INFECTION/BREAKDOWN Description: Manage skin with min assist  Outcome: Progressing Goal: RH STG MAINTAIN SKIN INTEGRITY WITH ASSISTANCE Description: STG Maintain Skin Integrity With min  Assistance. Outcome: Progressing Goal: RH STG ABLE TO PERFORM INCISION/WOUND CARE W/ASSISTANCE Description: STG Able To Perform Incision/Wound Care With Assistance. Outcome: Progressing   Problem: RH SAFETY Goal: RH STG ADHERE TO SAFETY PRECAUTIONS W/ASSISTANCE/DEVICE Description: STG Adhere to Safety Precautions With cues Assistance/Device. Outcome: Progressing   Problem: RH COGNITION-NURSING Goal: RH STG USES MEMORY AIDS/STRATEGIES W/ASSIST TO PROBLEM SOLVE Description: STG Uses Memory Aids/Strategies With cues Assistance to Problem Solve. Outcome: Progressing   Problem: RH KNOWLEDGE DEFICIT Goal: RH STG INCREASE KNOWLEDGE OF DYSPHAGIA/FLUID INTAKE Description: Manage dysphagia with cues Outcome: Progressing Goal: RH STG INCREASE KNOWLEDGE OF STROKE PROPHYLAXIS Description: Patient and mother will be able to manage stroke risks with medications and dietary modification using educational resources independently Outcome: Progressing   Problem: RH KNOWLEDGE DEFICIT BRAIN INJURY Goal: RH STG INCREASE KNOWLEDGE OF SELF CARE AFTER BRAIN INJURY Description:  Patient and mother will be able to manage post brain injury care with medications and dietary modification using educational resources independently Outcome: Progressing   Problem: Consults Goal: RH BRAIN INJURY PATIENT EDUCATION Description: Description: See Patient Education module for eduction specifics Outcome: Progressing

## 2022-12-05 NOTE — Progress Notes (Signed)
PROGRESS NOTE   Subjective/Complaints: No new complaints this morning SBP up slightly PRAFO and elbow splint ordered Nursing discussed area of redness between left thumb and index finger  ROS: Limited due to cognitive/behavioral, +area of redness between left thumb and index finger as per nursing    Objective:   No results found. Recent Labs    12/03/22 0718  WBC 8.7  HGB 13.4  HCT 39.7  PLT 218    Recent Labs    12/03/22 0718  NA 137  K 3.7  CL 103  CO2 24  GLUCOSE 92  BUN 19  CREATININE 0.57  CALCIUM 8.9     Intake/Output Summary (Last 24 hours) at 12/05/2022 1320 Last data filed at 12/05/2022 1610 Gross per 24 hour  Intake 480 ml  Output --  Net 480 ml      Pressure Injury 10/31/22 Buttocks Left Unstageable - Full thickness tissue loss in which the base of the injury is covered by slough (yellow, tan, gray, green or brown) and/or eschar (tan, brown or black) in the wound bed. (Active)  10/31/22 2000  Location: Buttocks  Location Orientation: Left  Staging: Unstageable - Full thickness tissue loss in which the base of the injury is covered by slough (yellow, tan, gray, green or brown) and/or eschar (tan, brown or black) in the wound bed.  Wound Description (Comments):   Present on Admission: Yes     Pressure Injury 11/18/22 Toe (Comment  which one) Anterior;Right Deep Tissue Pressure Injury - Purple or maroon localized area of discolored intact skin or blood-filled blister due to damage of underlying soft tissue from pressure and/or shear. small are (Active)  11/18/22 1300  Location: Toe (Comment  which one)  Location Orientation: Anterior;Right  Staging: Deep Tissue Pressure Injury - Purple or maroon localized area of discolored intact skin or blood-filled blister due to damage of underlying soft tissue from pressure and/or shear.  Wound Description (Comments): small area on right great toe  metatarsal  Present on Admission: Yes     Pressure Injury 11/18/22 Toe (Comment  which one) Anterior;Right Stage 1 -  Intact skin with non-blanchable redness of a localized area usually over a bony prominence. small pink area of non blanchable skin on Right pinky toe (Active)  11/18/22 1900  Location: Toe (Comment  which one)  Location Orientation: Anterior;Right  Staging: Stage 1 -  Intact skin with non-blanchable redness of a localized area usually over a bony prominence.  Wound Description (Comments): small pink area of non blanchable skin on Right pinky toe  Present on Admission: Yes    Physical Exam: Vital Signs Blood pressure (!) 149/85, pulse 75, temperature 97.7 F (36.5 C), resp. rate 16, height 5\' 1"  (1.549 m), weight 47.4 kg, SpO2 100%.  Gen: no distress, normal appearing. laying in bed. Mitt on right hand. HEENT: oral mucosa pink and moist, NCAT.  Scaling rash on posterior scalp. Cardio: Tachycardic Chest: normal effort, normal rate of breathing Abd: soft, non-distended Ext: no edema Psych: unable to assess Skin: Macerated left hand - healing  + Scaling dried skin on head - stable, stage 2 to right toe, area of redness between left thumb  and index finger  Neurologic: Does not respond to orientation questions, does not track, does not follow cues. Right upper extremity antigravity to scratch head, otherwise not cooperative with exam. Sensory exam unable to assess other than grimace to pinch of bilateral fingers and toes  Tone: MAS 3 right elbow, right knee, and right hip; MAS 3 L elbow otherwise resists ROM Cerebellar exam unable to assess  Musculoskeletal: reduced bilateral shoulder and elbow ROM due to increased tone    Assessment/Plan: 1. Functional deficits which require 3+ hours per day of interdisciplinary therapy in a comprehensive inpatient rehab setting. Physiatrist is providing close team supervision and 24 hour management of active medical problems listed  below. Physiatrist and rehab team continue to assess barriers to discharge/monitor patient progress toward functional and medical goals  Care Tool:  Bathing    Body parts bathed by patient: Chest, Face   Body parts bathed by helper: Right arm, Left arm, Chest, Abdomen, Front perineal area, Buttocks, Right upper leg, Left upper leg, Right lower leg, Left lower leg, Face Body parts n/a: Right arm, Right upper leg, Left arm, Left upper leg, Chest, Abdomen, Right lower leg, Front perineal area, Left lower leg, Buttocks, Face   Bathing assist Assist Level: Dependent - Patient 0%     Upper Body Dressing/Undressing Upper body dressing   What is the patient wearing?: Pull over shirt    Upper body assist Assist Level: Dependent - Patient 0%    Lower Body Dressing/Undressing Lower body dressing      What is the patient wearing?: Pants, Incontinence brief     Lower body assist Assist for lower body dressing: 2 Helpers     Toileting Toileting Toileting Activity did not occur (Clothing management and hygiene only): N/A (no void or bm)  Toileting assist Assist for toileting: Dependent - Patient 0%     Transfers Chair/bed transfer  Transfers assist  Chair/bed transfer activity did not occur: Safety/medical concerns  Chair/bed transfer assist level: Maximal Assistance - Patient 25 - 49%     Locomotion Ambulation   Ambulation assist   Ambulation activity did not occur: Safety/medical concerns          Walk 10 feet activity   Assist  Walk 10 feet activity did not occur: Safety/medical concerns        Walk 50 feet activity   Assist Walk 50 feet with 2 turns activity did not occur: Safety/medical concerns         Walk 150 feet activity   Assist Walk 150 feet activity did not occur: Safety/medical concerns         Walk 10 feet on uneven surface  activity   Assist Walk 10 feet on uneven surfaces activity did not occur: Safety/medical concerns          Wheelchair     Assist Is the patient using a wheelchair?: Yes Type of Wheelchair: Manual (tilt in space)    Wheelchair assist level: Dependent - Patient 0%      Wheelchair 50 feet with 2 turns activity    Assist    Wheelchair 50 feet with 2 turns activity did not occur: Safety/medical concerns   Assist Level: Dependent - Patient 0%   Wheelchair 150 feet activity     Assist  Wheelchair 150 feet activity did not occur: Safety/medical concerns   Assist Level: Dependent - Patient 0%   Blood pressure (!) 149/85, pulse 75, temperature 97.7 F (36.5 C), resp. rate 16, height 5\' 1"  (1.549  m), weight 47.4 kg, SpO2 100%.  Medical Problem List and Plan: 1. 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 Extubated 11/05/2022 Fluctuating level of alertness             -patient may  shower  Elbow splint and PRAFOs ordered             -ELOS/Goals: min/modA but prognosis is guarded due to severe cognitive deficits goals 28-30d  -Continue CIR therapies including PT, OT, and SLP    Tasia Catchings bed ordered  Mother updated  Palliative care consulted, discussed home with hospice but patient is not a candidate  MRI brain ordered given decline in speech-->no acute abnormality evident  Observed therapy, discussed disposition with SW, applying for nursing home placement  -discussed with PT and SW applying for nursing homes  -obtaining guardianship discussed with mother, form signed for her  2.  Impaired mobility: continue Lovenox             -antiplatelet therapy: N/A 3. Pain Management: Tylenol as needed 4. Mood/Behavior/Sleep: Provide emotional support             -antipsychotic agents: N/A  5. Neuropsych/cognition: This patient is not capable of making decisions on her own behalf. 6. Left buttock unstageable ulcer.  Continue Medihoney to buttock wound daily cover with foam dressing.  Changing dressing every 3 days or as  needed soiling   - hydrocortisone cream to scalp for rash 7/20  7. Fluids/Electrolytes/Nutrition: Routine in and outs with follow-up chemistries  -Labs 7/21 stable.  8.  Cavitary right lower lobe pneumonia likely aspiration pneumonia/MRSA pneumonia.  Continue course of Zyvox as indicated per infectious disease  -Labs 7/21 stable.  9.  History of drug abuse.  Positive cocaine on urine drug screen.  Provide counseling  10.  AKI/hypovolemia and ATN.  Renal function much improved.  Repeat Cr today.    Latest Ref Rng & Units 12/03/2022    7:18 AM 12/01/2022    5:00 AM 11/24/2022    6:25 AM  BMP  Glucose 70 - 99 mg/dL 92     BUN 6 - 20 mg/dL 19     Creatinine 1.61 - 1.00 mg/dL 0.96  0.45  4.09   Sodium 135 - 145 mmol/L 137     Potassium 3.5 - 5.1 mmol/L 3.7     Chloride 98 - 111 mmol/L 103     CO2 22 - 32 mmol/L 24     Calcium 8.9 - 10.3 mg/dL 8.9       11.  Mild transaminitis with rhabdomyolysis.  CK reviewed and normalized  12.  Hypotension.  Improving, d/c midodrine  13. Impaired initiation: decrease amantadine back to 100mg  BID given tachycardia/HTN  14. Suboptimal vitamin D: continue 1,000U D3 daily  15. Spasticity:   -Remains extremely tight, increase baclofen to 15 mg 3 times daily. Appears to have increased sedation with this increase, continue to monitor  16. Bowel and bladder incontinence: continue bowel and bladder program   17. Left hand skin maceration: Eucerin ordered, discussed hand split with OT  18. Tachycardia: decreased amantadine back to 100mg  BID, continue this dose  19. HTN: decrease amantadine to 100mg  BID. BP reviewed and has stabilized, continue this dose    12/05/2022    8:00 AM 12/05/2022    3:35 AM 12/04/2022    8:16 PM  Vitals with BMI  Systolic 149 131 811  Diastolic 85 97 91  Pulse 75 89 86  20. Area of redness between left thumb and index finger: continue foam dressing  LOS: 17 days A FACE TO FACE EVALUATION WAS PERFORMED  Nicole [name]  Nicole [name] 12/05/2022, 1:20 PM

## 2022-12-05 NOTE — Progress Notes (Addendum)
Orthopedic Tech Progress Note Patient Details:  Nicole [name] 02-Jun-1975 [id number]  Called in order to [employer] for an ELBOW BRACE (adjustable) and a PRAFO   Patient ID: Nicole [name], female   DOB: 10-03-1975, 47 y.o.   MRN: [id number]  [name] 12/05/2022, 11:40 AM

## 2022-12-05 NOTE — Progress Notes (Addendum)
Dr. Carlis Abbott notified  of blanchable redness to between left thumb and index finger. Foam placed for protection.   Tilden Dome, LPN

## 2022-12-05 NOTE — Progress Notes (Signed)
Physical Therapy Session Note  Patient Details  Name: Nicole [name] MRN: [id number] Date of Birth: [birth year]  Today's Date: 12/05/2022 PT Individual Time: 1017-1115 PT Individual Time Calculation (min): 58 min   Short Term Goals: Week 1:  PT Short Term Goal 1 (Week 1): pt will perform sit<>stand with +2 max A PT Short Term Goal 1 - Progress (Week 1): Not met PT Short Term Goal 2 (Week 1): pt will initiate gait training PT Short Term Goal 2 - Progress (Week 1): Not met PT Short Term Goal 3 (Week 1): Pt will perform bed<>chair transfer with +2 max A PT Short Term Goal 3 - Progress (Week 1): Not met Week 2:  PT Short Term Goal 1 (Week 2): Pt will initiate bed mobility but require no more than totalA PT Short Term Goal 2 (Week 2): Pt will maintain sitting balance unsupported for 30 seconds PT Short Term Goal 3 (Week 2): Pt will demonstrate command follow for ~15% of tasks PT Short Term Goal 4 (Week 2): Pt will be assessed for custom wheelchair needs  Skilled Therapeutic Interventions/Progress Updates:  Patient supine in bed on entrance to room. Patient awake, and not adverse to session though unable to directly respond.   Patient with no demo of immediate pain at start of session. During session, does demo discomfort/ irritation with touch to pt's face and to PROM to pt's neck initially that improves with movement and relaxation.   Therapeutic Activity: Pt prepared to utilize tilt function on KREG bed with straps above and below knees and chest strap donned. Unfortunately, tilt feature does not engage past 15* despite following instructions on bed screen. Pt then prepped to get out of bed.  Bed Mobility: Pt performed supine <> sit with TotA. VC/ tc required for step-by-step technique. Pt positioned in seated position and with stabilization to pelvis/ hips, she is able to maintain upright positioning for >30sec prior to slow drift of UB posteriorly.  Transfers: Pt performed sit<>stand and stand  pivot transfers throughout session with TotA and DEP for LLE step progression. Provided verbal cues for sequence of technique to stand then pivot step. Does not move LLE voluntarily but minimal movement with RLE with lateral weight shift.   Standing with pt for up to 2 min per bout. Used towel from seat to assist with hip extension once standing. UB supported with therapist's shoulder. Pt seated in TIS w/c and positioned in front of mirror and proceeded with neck ROM to improve tone and promote relaxation and improved posture. Pt in fully flexed neck position and requires slow progression to neutral flex/ ext. With time in position, pt demos improved facial expression toward comfort/ relaxation. Without support, pt returns to flexed position. [name] L hand held in open palm position and palm guard not donned. Positioned in moderate tilt for gravity pull of head into more extension and pillow prop to promote arm extension. Foam block placed in R hand prior to donning safety mitt d/t pt's continued head scratching.   Patient seated in TIS at end of session with brakes locked, belt alarm set, and all needs within reach.   Therapy Documentation Precautions:  Precautions Precautions: Fall Precaution Comments: Contact precautions & delayed processing Restrictions Weight Bearing Restrictions: No  Pain: Pain Assessment Pain Scale: Faces Faces Pain Scale: No hurt - any noted discomfort throughout session is relived with time in reposition.  Therapy/Group: Individual Therapy  Loel Dubonnet PT, DPT, CSRS 12/05/2022, 12:44 PM

## 2022-12-05 NOTE — Progress Notes (Signed)
Occupational Therapy Session Note  Patient Details  Name: Nicole [name] MRN: [id number] Date of Birth: Jul 15, 1975  Today's Date: 12/05/2022 OT Individual Time: 1300-1340 OT Individual Time Calculation (min): 40 min    Short Term Goals: Week 3:  OT Short Term Goal 1 (Week 3): pt will tolerate tilt table to at least 60* for 3 consecutive sessions as precursor to higher level functional mobility tasks OT Short Term Goal 2 (Week 3): pt will respond to verbal stimuli 3/5 trials to improve attention to task OT Short Term Goal 3 (Week 3): pt will maintain static sitting balance EOB with CGA for at least 10 secs as precursor to higher level ADl tasks  Skilled Therapeutic Interventions/Progress Updates:   Pt seen for self feeding as nursing had yet to perform for lunch. Pt with + response overall with trials of cup to mouth, spoon to mouth after loading and napkin to mouth all with R UE facilitation and hand over hand initiation. Pt able to attend to task during active self feeding components with increased level of response, no s/s of inappropriate rate nor coughing ot s/s of aspiration. Head control facilitation throughout session. 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 Mom arrived and took over to complete task stating appreciation for staff and attention to her daughter's needs and volitions. Left TIS level with mom, friends and all safety needs in place.   Pain: grimace with neck ROM with repositioning and Tylenol administered by nursing for relief   Therapy Documentation Precautions:  Precautions Precautions: Fall Precaution Comments: Contact precautions & delayed processing Restrictions Weight Bearing Restrictions: No    Therapy/Group: Individual Therapy  Vicenta Dunning 12/05/2022, 7:48 AM

## 2022-12-05 NOTE — Progress Notes (Signed)
Occupational Therapy Session Note  Patient Details  Name: Nicole [name] MRN: [id number] Date of Birth: 04-19-1976  Today's Date: 12/05/2022 OT Individual Time: 4098-1191 OT Individual Time Calculation (min): 45 min  and Today's Date: 12/05/2022 OT Missed Time: 15 Minutes Missed Time Reason: Nursing care   Short Term Goals: Week 1:  OT Short Term Goal 1 (Week 1): Pt will initiate simple ADL task with Max multimodal cuing. OT Short Term Goal 1 - Progress (Week 1): Not met OT Short Term Goal 2 (Week 1): Pt will attend to ADL task for >2 min in distraction-free environment. OT Short Term Goal 2 - Progress (Week 1): Not met OT Short Term Goal 3 (Week 1): Pt will express preference between two options in preparation for self-advocacy during medical care. OT Short Term Goal 3 - Progress (Week 1): Not met Week 2:  OT Short Term Goal 1 (Week 2): Patient will visually attend to self in mirror x 3-5 seconds with max cueing as prerequisite to grooming tasks OT Short Term Goal 1 - Progress (Week 2): Progressing toward goal OT Short Term Goal 2 (Week 2): Patient will participate following hand over hand cueing in familiar functional task - e.g. brushing hair, brushing teeth, washing body with RUE and mod assist OT Short Term Goal 2 - Progress (Week 2): Met OT Short Term Goal 3 (Week 2): Patient will sit unsupported x 5 seconds in preparation for level surface transfer OT Short Term Goal 3 - Progress (Week 2): Progressing toward goal OT Short Term Goal 4 (Week 2): Patient will demonstrate localized response (looking toward)to auditory stimulus (name being called) 3 of 5 trials from supported position. OT Short Term Goal 4 - Progress (Week 2): Progressing toward goal OT Short Term Goal 5 (Week 2): Patient will demonstrate improved passive or active assisted elbow range of motion in preparation for assistance with BADL.  Right -5* extension, Left - 15* extension OT Short Term Goal 5 - Progress (Week 2):  Progressing toward goal Week 3:  OT Short Term Goal 1 (Week 3): pt will tolerate tilt table to at least 60* for 3 consecutive sessions as precursor to higher level functional mobility tasks OT Short Term Goal 2 (Week 3): pt will respond to verbal stimuli 3/5 trials to improve attention to task OT Short Term Goal 3 (Week 3): pt will maintain static sitting balance EOB with CGA for at least 10 secs as precursor to higher level ADl tasks  Skilled Therapeutic Interventions/Progress Updates:    Pt received in bed after NT and RN finished nursing care and changing pt. NT had started feeding pt breakfast. OT placed red foam grip on fork to work on hand over hand guiding for self feeding. Pt needs max A to guide utensil to plate and then to mouth but was able to actively move hand to mouth for the last few inches.  Her elbow and wrist hypertone were more relaxed today so movement pattern of hand to mouth to plate not difficulty to facilitate. Alternated every few bites with sips of juice or water. Pt held cup in hand as therapist brought cup towards her mouth. Pt ate every bit of breakfast!  From bed donned thigh high TED hose and pants with total A of 2 for rolling and lifting her legs.  Sat pt upright in bed to don shirt with total A. Positioned arms with pillows.   Removed L palmar splint and noted redness in webspace. Notified RN and left splint  off.  Pt not clenching hand closed.   Pt resting in bed with all needs met. Alarm set and call light in reach.    Therapy Documentation Precautions:  Precautions Precautions: Fall Precaution Comments: Contact precautions & delayed processing Restrictions Weight Bearing Restrictions: No General: General OT Amount of Missed Time: 15 Minutes    Pain: Pain Assessment Pain Scale: Faces Faces Pain Scale: No hurt ADL: ADL Eating: Maximal assistance Where Assessed-Eating: Chair Grooming: Dependent Where Assessed-Grooming: Bed level Upper Body Bathing:  Dependent Where Assessed-Upper Body Bathing: Bed level Lower Body Bathing: Dependent Where Assessed-Lower Body Bathing: Bed level Upper Body Dressing: Dependent Where Assessed-Upper Body Dressing: Bed level Lower Body Dressing: Dependent Where Assessed-Lower Body Dressing: Bed level Toileting: Dependent Where Assessed-Toileting: Bed level Toilet Transfer: Unable to assess Psychologist, counselling Transfer: Unable to assess  Therapy/Group: Individual Therapy  Nicole [name] 12/05/2022, 12:16 PM

## 2022-12-05 NOTE — Progress Notes (Signed)
Speech Language Pathology Daily Session Note  Patient Details  Name: Nicole [name] MRN: [id number] Date of Birth: 08-17-1975  Today's Date: 12/05/2022 SLP Individual Time: 3086-5784 SLP Individual Time Calculation (min): 57 min  Short Term Goals: Week 3: SLP Short Term Goal 1 (Week 3): Patient will utilize multimodal communication to answer basic yes/no questions with Max A multimodal cues in 10% of opportunities SLP Short Term Goal 2 (Week 3): Patient will follow 1-step commands within a context in 10% of opportunities with Max A multimodal cues. SLP Short Term Goal 3 (Week 3): Pt will present w/ appropriate response to functional stimuli (i.e. accept spoon, grasp basic item appropriately, response to tactile stimuli) 10% of the time w/ maxA multimodal cues  Skilled Therapeutic Interventions:  Pt was seen in PM to address dysphagia management and cognitive re- training. Pt was alert and seated upright in WC upon SLP arrival. [name] childhood friend and pt's mother present assisting with feeding. All participants participated as appropriate throughout session. SLP challenging pt in appropriate use of functional items this date. Pt able to grasp spoon and move it toward her mouth. Min A warranted for accurate placement of bolus. SLP further trialed pt presenting finger foods however, limited success observed likely 2/2 food already being pre cut into small pieces due to Dys 3 diet and  bolus ultimately getting lost in the palm of her hand. In additional minutes of session, SLP addressed use of multimodal communication to answer basic yes/ no questions. SLP presented biographical and concrete questions verbally. Visual presentation of binary opposites places within eye sight. Instruction including hand over hand practice completed prior to trials. Given questions presented verbally, pt answered questions given hand over hand assist in all opportunities. No observed indep initiation observed. In additional  minutes of session, SLP provided education regarding creation of memory book/ presentation of some of pt's preferred things and task to increase engagement and for caregiver training upon upcoming discharge. [name] appreciative of information. Pt left seated upright in TIS WC with mother present and chair alarm active. SLP to continue POC.   Pain Pain Assessment Pain Scale: Faces Faces Pain Scale: No hurt  Therapy/Group: Individual Therapy  [name] 12/05/2022, 3:08 PM

## 2022-12-06 ENCOUNTER — Encounter: Payer: Medicaid Other | Admitting: Family Medicine

## 2022-12-06 MED ORDER — SELENIUM SULFIDE 1 % EX LOTN
TOPICAL_LOTION | CUTANEOUS | Status: DC | PRN
  Filled 2022-12-06: qty 207

## 2022-12-06 MED ORDER — SELENIUM SULFIDE 1 % EX LOTN
TOPICAL_LOTION | Freq: Every day | CUTANEOUS | Status: DC
  Filled 2022-12-06: qty 207

## 2022-12-06 MED ORDER — BACLOFEN 10 MG PO TABS
20.0000 mg | ORAL_TABLET | Freq: Three times a day (TID) | ORAL | Status: DC
  Administered 2022-12-06 – 2022-12-12 (×18): 20 mg via ORAL
  Filled 2022-12-06 (×18): qty 2

## 2022-12-06 NOTE — Progress Notes (Signed)
Speech Language Pathology Daily Session Note  Patient Details  Name: FRANKEE GRITZ MRN: [id number] Date of Birth: 03-12-1976  Today's Date: 12/06/2022 SLP Individual Time: 1430-1530 SLP Individual Time Calculation (min): 60 min  Short Term Goals: Week 3: SLP Short Term Goal 1 (Week 3): Patient will utilize multimodal communication to answer basic yes/no questions with Max A multimodal cues in 10% of opportunities SLP Short Term Goal 2 (Week 3): Patient will follow 1-step commands within a context in 10% of opportunities with Max A multimodal cues. SLP Short Term Goal 3 (Week 3): Pt will present w/ appropriate response to functional stimuli (i.e. accept spoon, grasp basic item appropriately, response to tactile stimuli) 10% of the time w/ maxA multimodal cues  Skilled Therapeutic Interventions:   Pt greeted at bedside w/ her mother present as well. She was awake, though variable alertness noted. SLP facilitated a variety of tx tasks targeting cognition. Upon SLP's entrance, pt noted to turn her eyes and head towards SLP to acknowledge her. Attempted Y/N questions via pointing to binary choices, though reduced alertness noted and SLP discontinued the task as the pt was unable to remain awake. SLP repositioned the pt in bed to allow for upright positioning and initiated oral care. After SLP placed the suctioning in her hand and initiating the brushing motion w/ hand over hand assist, she was able to continue the motion for ~20 seconds before biting the toothbrush inappropriately. Increased success noted w/ R side of her oral cavity vs L, as she was only able to continue for ~5 seconds. After oral care, SLP provided demo to wash her face with a washcloth. She was DEP to complete remainder of the task as she continuously tried to place the washcloth in her mouth. At the end of the tx session, pt noted to sustain her eye gaze in the direction of preferred music for ~1.5 minutes. Pt left in bed with the bed  alarm set and call light within reach. Recommend cont ST per POC.   Pain  Unable to report pain. Appeared comfortable.   Therapy/Group: Individual Therapy  [name] 12/06/2022, 7:53 AM

## 2022-12-06 NOTE — Progress Notes (Signed)
Patient ID: Nicole [name], female   DOB: 1976-03-03, 47 y.o.   MRN: [id number]  Met with Mom who is here to update her regarding team conference the medications changes and improvement with alertness and attempt to assist with feeding herself. Mom has completes the application for [facility] and will talk with social worker there. Will reach out and inform them Mom does work there and see if this will get them to consider her for admission. Will re-send [facility] out since they did decline her a few days ago. Continue to work on SNF bed.

## 2022-12-06 NOTE — Progress Notes (Signed)
PROGRESS NOTE   Subjective/Complaints: No new complaints this morning Selsum blue ordered for scalp eczema Patient's chart reviewed- No issues reported overnight Vitals signs stable   GLO:VFIEPPI due to cognitive/behavioral, +area of redness between left thumb and index finger as per nursing    Objective:   No results found. No results for input(s): "WBC", "HGB", "HCT", "PLT" in the last 72 hours.   No results for input(s): "NA", "K", "CL", "CO2", "GLUCOSE", "BUN", "CREATININE", "CALCIUM" in the last 72 hours.    Intake/Output Summary (Last 24 hours) at 12/06/2022 1037 Last data filed at 12/06/2022 9518 Gross per 24 hour  Intake 657 ml  Output --  Net 657 ml      Pressure Injury 10/31/22 Buttocks Left Unstageable - Full thickness tissue loss in which the base of the injury is covered by slough (yellow, tan, gray, green or brown) and/or eschar (tan, brown or black) in the wound bed. (Active)  10/31/22 2000  Location: Buttocks  Location Orientation: Left  Staging: Unstageable - Full thickness tissue loss in which the base of the injury is covered by slough (yellow, tan, gray, green or brown) and/or eschar (tan, brown or black) in the wound bed.  Wound Description (Comments):   Present on Admission: Yes     Pressure Injury 11/18/22 Toe (Comment  which one) Anterior;Right Deep Tissue Pressure Injury - Purple or maroon localized area of discolored intact skin or blood-filled blister due to damage of underlying soft tissue from pressure and/or shear. small are (Active)  11/18/22 1300  Location: Toe (Comment  which one)  Location Orientation: Anterior;Right  Staging: Deep Tissue Pressure Injury - Purple or maroon localized area of discolored intact skin or blood-filled blister due to damage of underlying soft tissue from pressure and/or shear.  Wound Description (Comments): small area on right great toe metatarsal  Present  on Admission: Yes     Pressure Injury 11/18/22 Toe (Comment  which one) Anterior;Right Stage 1 -  Intact skin with non-blanchable redness of a localized area usually over a bony prominence. small pink area of non blanchable skin on Right pinky toe (Active)  11/18/22 1900  Location: Toe (Comment  which one)  Location Orientation: Anterior;Right  Staging: Stage 1 -  Intact skin with non-blanchable redness of a localized area usually over a bony prominence.  Wound Description (Comments): small pink area of non blanchable skin on Right pinky toe  Present on Admission: Yes    Physical Exam: Vital Signs Blood pressure 114/82, pulse 64, temperature 98.1 F (36.7 C), resp. rate 16, height 5\' 1"  (1.549 m), weight 47.4 kg, SpO2 100%.  Gen: no distress, normal appearing. laying in bed. Mitt on right hand. HEENT: oral mucosa pink and moist, NCAT.  Scaling rash on posterior scalp. Cardio: heart rate has normalized Chest: normal effort, normal rate of breathing Abd: soft, non-distended Ext: no edema Psych: unable to assess Skin: Macerated left hand - healing  + Scaling dried skin on head - stable, stage 2 to right toe, area of redness between left thumb and index finger  Neurologic: Does not respond to orientation questions, does not track, does not follow cues. Right upper extremity antigravity to  scratch head, otherwise not cooperative with exam. Sensory exam unable to assess other than grimace to pinch of bilateral fingers and toes  Tone: MAS 3 right elbow, right knee, and right hip; MAS 3 L elbow otherwise resists ROM Cerebellar exam unable to assess  Musculoskeletal: reduced bilateral shoulder and elbow ROM due to increased tone    Assessment/Plan: 1. Functional deficits which require 3+ hours per day of interdisciplinary therapy in a comprehensive inpatient rehab setting. Physiatrist is providing close team supervision and 24 hour management of active medical problems listed  below. Physiatrist and rehab team continue to assess barriers to discharge/monitor patient progress toward functional and medical goals  Care Tool:  Bathing    Body parts bathed by patient: Chest, Face   Body parts bathed by helper: Right arm, Left arm, Chest, Abdomen, Front perineal area, Buttocks, Right upper leg, Left upper leg, Right lower leg, Left lower leg, Face Body parts n/a: Right arm, Right upper leg, Left arm, Left upper leg, Chest, Abdomen, Right lower leg, Front perineal area, Left lower leg, Buttocks, Face   Bathing assist Assist Level: Dependent - Patient 0%     Upper Body Dressing/Undressing Upper body dressing   What is the patient wearing?: Pull over shirt    Upper body assist Assist Level: Dependent - Patient 0%    Lower Body Dressing/Undressing Lower body dressing      What is the patient wearing?: Pants, Incontinence brief     Lower body assist Assist for lower body dressing: 2 Helpers     Toileting Toileting Toileting Activity did not occur (Clothing management and hygiene only): N/A (no void or bm)  Toileting assist Assist for toileting: Dependent - Patient 0%     Transfers Chair/bed transfer  Transfers assist  Chair/bed transfer activity did not occur: Safety/medical concerns  Chair/bed transfer assist level: Maximal Assistance - Patient 25 - 49%     Locomotion Ambulation   Ambulation assist   Ambulation activity did not occur: Safety/medical concerns          Walk 10 feet activity   Assist  Walk 10 feet activity did not occur: Safety/medical concerns        Walk 50 feet activity   Assist Walk 50 feet with 2 turns activity did not occur: Safety/medical concerns         Walk 150 feet activity   Assist Walk 150 feet activity did not occur: Safety/medical concerns         Walk 10 feet on uneven surface  activity   Assist Walk 10 feet on uneven surfaces activity did not occur: Safety/medical concerns          Wheelchair     Assist Is the patient using a wheelchair?: Yes Type of Wheelchair: Manual (tilt in space)    Wheelchair assist level: Dependent - Patient 0%      Wheelchair 50 feet with 2 turns activity    Assist    Wheelchair 50 feet with 2 turns activity did not occur: Safety/medical concerns   Assist Level: Dependent - Patient 0%   Wheelchair 150 feet activity     Assist  Wheelchair 150 feet activity did not occur: Safety/medical concerns   Assist Level: Dependent - Patient 0%   Blood pressure 114/82, pulse 64, temperature 98.1 F (36.7 C), resp. rate 16, height 5\' 1"  (1.549 m), weight 47.4 kg, SpO2 100%.  Medical Problem List and Plan: 1. 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 Extubated 11/05/2022 Fluctuating level of alertness             -patient may  shower  Elbow splint and PRAFOs ordered             -ELOS/Goals: min/modA but prognosis is guarded due to severe cognitive deficits goals 28-30d  -Continue CIR therapies including PT, OT, and SLP    Tasia Catchings bed ordered  Mother updated  Palliative care consulted, discussed home with hospice but patient is not a candidate  MRI brain ordered given decline in speech-->no acute abnormality evident  Observed therapy, discussed disposition with SW, applying for nursing home placement  -discussed with PT and SW applying for nursing homes  -obtaining guardianship discussed with mother, form signed for her  Team conference 7/24  2.  Impaired mobility: continue Lovenox             -antiplatelet therapy: N/A 3. Pain Management: Tylenol as needed 4. Mood/Behavior/Sleep: Provide emotional support             -antipsychotic agents: N/A  5. Neuropsych/cognition: This patient is not capable of making decisions on her own behalf. 6. Left buttock unstageable ulcer.  Continue Medihoney to buttock wound daily cover with foam dressing.  Changing dressing  every 3 days or as needed soiling   - hydrocortisone cream to scalp for rash 7/20  7. Fluids/Electrolytes/Nutrition: Routine in and outs with follow-up chemistries  -Labs 7/21 stable.  8.  Cavitary right lower lobe pneumonia likely aspiration pneumonia/MRSA pneumonia.  Continue course of Zyvox as indicated per infectious disease  -Labs 7/21 stable.  9.  History of drug abuse.  Positive cocaine on urine drug screen.  Provide counseling  10.  AKI/hypovolemia and ATN.  Renal function much improved.  Repeat Cr today.    Latest Ref Rng & Units 12/03/2022    7:18 AM 12/01/2022    5:00 AM 11/24/2022    6:25 AM  BMP  Glucose 70 - 99 mg/dL 92     BUN 6 - 20 mg/dL 19     Creatinine 1.61 - 1.00 mg/dL 0.96  0.45  4.09   Sodium 135 - 145 mmol/L 137     Potassium 3.5 - 5.1 mmol/L 3.7     Chloride 98 - 111 mmol/L 103     CO2 22 - 32 mmol/L 24     Calcium 8.9 - 10.3 mg/dL 8.9       11.  Mild transaminitis with rhabdomyolysis.  CK reviewed and normalized  12.  Hypotension. Resolved, d/c midodrine  13. Impaired initiation: continue amantadine back to 100mg  BID  14. Suboptimal vitamin D: continue 1,000U D3 daily  15. Spasticity:   -Remains extremely tight, increase baclofen to 15 mg 3 times daily. Appears to have increased sedation with this increase, continue to monitor  16. Bowel and bladder incontinence: continue bowel and bladder program   17. Left hand skin maceration: Eucerin ordered, discussed hand split with OT  18. Tachycardia: decreased amantadine back to 100mg  BID, continue this dose  19. HTN: decrease amantadine to 100mg  BID. BP reviewed and has stabilized, continue this dose    12/06/2022    2:57 AM 12/05/2022    8:37 PM 12/05/2022    2:48 PM  Vitals with BMI  Systolic 114 117 811  Diastolic 82 79 86  Pulse 64 75 64    20. Area of redness between left thumb and index finger: continue foam dressing  21. Scalp eczema: selsun blue ordered  LOS: 18 days A FACE TO FACE  EVALUATION WAS PERFORMED  Drema Pry Larz Mark 12/06/2022, 10:37 AM

## 2022-12-06 NOTE — Progress Notes (Signed)
Occupational Therapy Session Note  Patient Details  Name: Nicole [name] MRN: [id number] Date of Birth: March 28, 1976  Today's Date: 12/06/2022 OT Individual Time: 9562-1308 OT Individual Time Calculation (min): 69 min    Short Term Goals: Week 3:  OT Short Term Goal 1 (Week 3): pt will tolerate tilt table to at least 60* for 3 consecutive sessions as precursor to higher level functional mobility tasks OT Short Term Goal 2 (Week 3): pt will respond to verbal stimuli 3/5 trials to improve attention to task OT Short Term Goal 3 (Week 3): pt will maintain static sitting balance EOB with CGA for at least 10 secs as precursor to higher level ADl tasks  Skilled Therapeutic Interventions/Progress Updates:    Patient received seated in wheelchair, partly reclined.  Patient taken to therapy gym to address postural control, sitting balance, head control and UE range of motion.  Patient transferred via stand pivot with total assist.  Patient accepting weight on BLE.  Patient able to maintain static sitting position x 10 seconds with close monitoring!  Facilitated more upright posture with trunk support, and reducing head pulling forward.  Supported weight of Ue's on elevated table and worked on grasp/ release, stimulus/ response.   Worked to increase range of motion in trunk toward lateral flexion and rotation to free up motion in neck and upper quadrant.  Played guitar music during session- as patient Psychologist, clinical.  Nursing staff asked for patient to be returned to bed at end of session to allow them to complete skin check.   Patient returned to room, and back to bed.  Worked on rolling, body over arm, counter rotation, and positioning on her side in bed and maintaining support to head/ neck.    Therapy Documentation Precautions:  Precautions Precautions: Fall Precaution Comments: Contact precautions & delayed processing Restrictions Weight Bearing Restrictions: No   Pain:  Unable to score - grimaces  with gentle range to neck, shoulders, elbows L>R     Therapy/Group: Individual Therapy  [name] 12/06/2022, 12:30 PM

## 2022-12-06 NOTE — Plan of Care (Signed)
  Problem: Consults Goal: RH STROKE PATIENT EDUCATION Description: See Patient Education module for education specifics  Outcome: Progressing   Problem: RH BOWEL ELIMINATION Goal: RH STG MANAGE BOWEL WITH ASSISTANCE Description: STG Manage Bowel with mod I Assistance. Outcome: Progressing Goal: RH STG MANAGE BOWEL W/MEDICATION W/ASSISTANCE Description: STG Manage Bowel with Medication with mod I Assistance. Outcome: Progressing   Problem: RH BLADDER ELIMINATION Goal: RH STG MANAGE BLADDER WITH ASSISTANCE Description: STG Manage Bladder With toileting Assistance Outcome: Progressing   Problem: RH SKIN INTEGRITY Goal: RH STG SKIN FREE OF INFECTION/BREAKDOWN Description: Manage skin with min assist  Outcome: Progressing Goal: RH STG MAINTAIN SKIN INTEGRITY WITH ASSISTANCE Description: STG Maintain Skin Integrity With min  Assistance. Outcome: Progressing Goal: RH STG ABLE TO PERFORM INCISION/WOUND CARE W/ASSISTANCE Description: STG Able To Perform Incision/Wound Care With Assistance. Outcome: Progressing   Problem: RH SAFETY Goal: RH STG ADHERE TO SAFETY PRECAUTIONS W/ASSISTANCE/DEVICE Description: STG Adhere to Safety Precautions With cues Assistance/Device. Outcome: Progressing   Problem: RH COGNITION-NURSING Goal: RH STG USES MEMORY AIDS/STRATEGIES W/ASSIST TO PROBLEM SOLVE Description: STG Uses Memory Aids/Strategies With cues Assistance to Problem Solve. Outcome: Progressing   Problem: RH KNOWLEDGE DEFICIT Goal: RH STG INCREASE KNOWLEDGE OF DYSPHAGIA/FLUID INTAKE Description: Manage dysphagia with cues Outcome: Progressing Goal: RH STG INCREASE KNOWLEDGE OF STROKE PROPHYLAXIS Description: Patient and mother will be able to manage stroke risks with medications and dietary modification using educational resources independently Outcome: Progressing   Problem: RH KNOWLEDGE DEFICIT BRAIN INJURY Goal: RH STG INCREASE KNOWLEDGE OF SELF CARE AFTER BRAIN INJURY Description:  Patient and mother will be able to manage post brain injury care with medications and dietary modification using educational resources independently Outcome: Progressing   Problem: Consults Goal: RH BRAIN INJURY PATIENT EDUCATION Description: Description: See Patient Education module for eduction specifics Outcome: Progressing

## 2022-12-07 NOTE — Plan of Care (Signed)
  Problem: RH Balance Goal: LTG Patient will maintain dynamic sitting balance (PT) Description: LTG:  Patient will maintain dynamic sitting balance with assistance during mobility activities (PT) Flowsheets (Taken 12/07/2022 0837) LTG: Pt will maintain dynamic sitting balance during mobility activities with:: Total Assistance - Patient < 25%   Problem: Sit to Stand Goal: LTG:  Patient will perform sit to stand with assistance level (PT) Description: LTG:  Patient will perform sit to stand with assistance level (PT) Flowsheets (Taken 12/07/2022 0837) LTG: PT will perform sit to stand in preparation for functional mobility with assistance level: Total Assistance - Patient < 25%   Problem: RH Bed Mobility Goal: LTG Patient will perform bed mobility with assist (PT) Description: LTG: Patient will perform bed mobility with assistance, with/without cues (PT). Flowsheets (Taken 12/07/2022 0837) LTG: Pt will perform bed mobility with assistance level of: Total Assistance - Patient < 25%

## 2022-12-07 NOTE — Progress Notes (Signed)
PROGRESS NOTE   Subjective/Complaints: Patient's chart reviewed- No issues reported overnight Vitals signs stable  Pending SNF placement  ROS: Limited due to cognitive/behavioral, +area of redness between left thumb and index finger as per nursing    Objective:   No results found. No results for input(s): "WBC", "HGB", "HCT", "PLT" in the last 72 hours.   No results for input(s): "NA", "K", "CL", "CO2", "GLUCOSE", "BUN", "CREATININE", "CALCIUM" in the last 72 hours.    Intake/Output Summary (Last 24 hours) at 12/07/2022 1039 Last data filed at 12/07/2022 0850 Gross per 24 hour  Intake 427 ml  Output --  Net 427 ml      Pressure Injury 10/31/22 Buttocks Left Unstageable - Full thickness tissue loss in which the base of the injury is covered by slough (yellow, tan, gray, green or brown) and/or eschar (tan, brown or black) in the wound bed. (Active)  10/31/22 2000  Location: Buttocks  Location Orientation: Left  Staging: Unstageable - Full thickness tissue loss in which the base of the injury is covered by slough (yellow, tan, gray, green or brown) and/or eschar (tan, brown or black) in the wound bed.  Wound Description (Comments):   Present on Admission: Yes     Pressure Injury 11/18/22 Toe (Comment  which one) Anterior;Right Deep Tissue Pressure Injury - Purple or maroon localized area of discolored intact skin or blood-filled blister due to damage of underlying soft tissue from pressure and/or shear. small are (Active)  11/18/22 1300  Location: Toe (Comment  which one)  Location Orientation: Anterior;Right  Staging: Deep Tissue Pressure Injury - Purple or maroon localized area of discolored intact skin or blood-filled blister due to damage of underlying soft tissue from pressure and/or shear.  Wound Description (Comments): small area on right great toe metatarsal  Present on Admission: Yes    Physical Exam: Vital  Signs Blood pressure 106/75, pulse 62, temperature 98.1 F (36.7 C), resp. rate 16, height 5\' 1"  (1.549 m), weight 47.4 kg, SpO2 100%.  Gen: no distress, normal appearing. BMI 19.74 HEENT: oral mucosa pink and moist, NCAT.  Scaling rash on posterior scalp. Cardio: heart rate has normalized Chest: normal effort, normal rate of breathing Abd: soft, non-distended Ext: no edema Psych: unable to assess Skin: Macerated left hand - healing  + Scaling dried skin on head - stable, stage 2 to right toe, area of redness between left thumb and index finger  Neurologic: Does not respond to orientation questions, does not track, does not follow cues. Right upper extremity antigravity to scratch head, otherwise not cooperative with exam. Sensory exam unable to assess other than grimace to pinch of bilateral fingers and toes  Tone: MAS 3 right elbow, right knee, and right hip; MAS 3 L elbow otherwise resists ROM Cerebellar exam unable to assess  Musculoskeletal: reduced bilateral shoulder and elbow ROM due to increased tone    Assessment/Plan: 1. Functional deficits which require 3+ hours per day of interdisciplinary therapy in a comprehensive inpatient rehab setting. Physiatrist is providing close team supervision and 24 hour management of active medical problems listed below. Physiatrist and rehab team continue to assess barriers to discharge/monitor patient progress toward functional  and medical goals  Care Tool:  Bathing    Body parts bathed by patient: Chest, Face   Body parts bathed by helper: Right arm, Left arm, Chest, Abdomen, Front perineal area, Buttocks, Right upper leg, Left upper leg, Right lower leg, Left lower leg, Face Body parts n/a: Right arm, Right upper leg, Left arm, Left upper leg, Chest, Abdomen, Right lower leg, Front perineal area, Left lower leg, Buttocks, Face   Bathing assist Assist Level: Dependent - Patient 0%     Upper Body Dressing/Undressing Upper body  dressing   What is the patient wearing?: Pull over shirt    Upper body assist Assist Level: Dependent - Patient 0%    Lower Body Dressing/Undressing Lower body dressing      What is the patient wearing?: Pants, Incontinence brief     Lower body assist Assist for lower body dressing: 2 Helpers     Toileting Toileting Toileting Activity did not occur (Clothing management and hygiene only): N/A (no void or bm)  Toileting assist Assist for toileting: Dependent - Patient 0%     Transfers Chair/bed transfer  Transfers assist  Chair/bed transfer activity did not occur: Safety/medical concerns  Chair/bed transfer assist level: Maximal Assistance - Patient 25 - 49%     Locomotion Ambulation   Ambulation assist   Ambulation activity did not occur: Safety/medical concerns          Walk 10 feet activity   Assist  Walk 10 feet activity did not occur: Safety/medical concerns        Walk 50 feet activity   Assist Walk 50 feet with 2 turns activity did not occur: Safety/medical concerns         Walk 150 feet activity   Assist Walk 150 feet activity did not occur: Safety/medical concerns         Walk 10 feet on uneven surface  activity   Assist Walk 10 feet on uneven surfaces activity did not occur: Safety/medical concerns         Wheelchair     Assist Is the patient using a wheelchair?: Yes Type of Wheelchair: Manual (tilt in space)    Wheelchair assist level: Dependent - Patient 0%      Wheelchair 50 feet with 2 turns activity    Assist    Wheelchair 50 feet with 2 turns activity did not occur: Safety/medical concerns   Assist Level: Dependent - Patient 0%   Wheelchair 150 feet activity     Assist  Wheelchair 150 feet activity did not occur: Safety/medical concerns   Assist Level: Dependent - Patient 0%   Blood pressure 106/75, pulse 62, temperature 98.1 F (36.7 C), resp. rate 16, height 5\' 1"  (1.549 m), weight 47.4  kg, SpO2 100%.  Medical Problem List and Plan: 1. 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 Extubated 11/05/2022 Fluctuating level of alertness             -patient may  shower  Elbow splint and PRAFOs ordered             -ELOS/Goals: min/modA but prognosis is guarded due to severe cognitive deficits goals 28-30d  -Continue CIR therapies including PT, OT, and SLP    Tasia Catchings bed ordered  Mother updated  Palliative care consulted, discussed home with hospice but patient is not a candidate  MRI brain ordered given decline in speech-->no acute abnormality evident  Observed therapy, discussed disposition with SW, applying for nursing home  placement  -discussed with PT and SW applying for nursing homes  -obtaining guardianship discussed with mother, form signed for her  Team conference 7/24  2.  Impaired mobility: continue Lovenox             -antiplatelet therapy: N/A 3. Pain Management: Tylenol as needed 4. Mood/Behavior/Sleep: Provide emotional support             -antipsychotic agents: N/A  5. Neuropsych/cognition: This patient is not capable of making decisions on her own behalf. 6. Left buttock unstageable ulcer.  Continue Medihoney to buttock wound daily cover with foam dressing.  Changing dressing every 3 days or as needed soiling   - hydrocortisone cream to scalp for rash 7/20  7. Fluids/Electrolytes/Nutrition: Routine in and outs with follow-up chemistries  -Labs 7/21 stable.  8.  Cavitary right lower lobe pneumonia likely aspiration pneumonia/MRSA pneumonia.  Continue course of Zyvox as indicated per infectious disease  -Labs 7/21 stable.  9.  History of drug abuse.  Positive cocaine on urine drug screen.  Provide counseling  10.  AKI/hypovolemia and ATN.  Renal function much improved.  Repeat Cr today.    Latest Ref Rng & Units 12/03/2022    7:18 AM 12/01/2022    5:00 AM 11/24/2022    6:25 AM  BMP  Glucose 70 -  99 mg/dL 92     BUN 6 - 20 mg/dL 19     Creatinine 4.09 - 1.00 mg/dL 8.11  9.14  7.82   Sodium 135 - 145 mmol/L 137     Potassium 3.5 - 5.1 mmol/L 3.7     Chloride 98 - 111 mmol/L 103     CO2 22 - 32 mmol/L 24     Calcium 8.9 - 10.3 mg/dL 8.9       11.  Mild transaminitis with rhabdomyolysis.  CK reviewed and normalized  12.  Hypotension. Resolved, d/c midodrine  13. Impaired initiation: continue amantadine back to 100mg  BID  14. Suboptimal vitamin D: continue 1,000U D3 daily  15. Spasticity:   -Remains extremely tight, increase baclofen to 20 mg 3 times daily. Appears to have increased sedation with this increase, continue to monitor  16. Bowel and bladder incontinence: continue bowel and bladder program   17. Left hand skin maceration: Eucerin ordered, discussed hand split with OT  18. Tachycardia: decreased amantadine back to 100mg  BID, continue this dose  19. HTN: decrease amantadine to 100mg  BID. BP reviewed and has stabilized, continue this dose    12/07/2022    4:04 AM 12/06/2022    7:23 PM 12/06/2022   12:45 PM  Vitals with BMI  Systolic 106 119 956  Diastolic 75 78 95  Pulse 62 93 82    20. Area of redness between left thumb and index finger: continue foam dressing  21. Scalp eczema: selsun blue ordered, continue  LOS: 19 days A FACE TO FACE EVALUATION WAS PERFORMED  Drema Pry Thomasene Dubow 12/07/2022, 10:39 AM

## 2022-12-07 NOTE — Progress Notes (Signed)
Physical Therapy Session Note  Patient Details  Name: NIL BOLSER MRN: [id number] Date of Birth: 12-Aug-1975  Today's Date: 12/07/2022 PT Individual Time: 4098-1191 PT Individual Time Calculation (min): 30 min   Short Term Goals: Week 3:  PT Short Term Goal 1 (Week 3): Pt will initiate bed mobility but require no more than totalA PT Short Term Goal 2 (Week 3): Pt will maintain sitting balance unsupported for 30 seconds PT Short Term Goal 3 (Week 3): Pt will demonstrate command follow for ~15% of tasks.  Skilled Therapeutic Interventions/Progress Updates: Pt presented in bed in NAD. Due to limited time session focused on L attention and stretching/PROM. PTA removed PRAFOs and performed stretching of heel cord, hamstrings, and PROM hip IR/ER as well as KTC. PTA also performed extensive stretching ROM of LUE as well as repositioned hand splint so that padding was better positioned. PTA was unable to achieve full elbow extension, however once LUE stretched PTA placed pillow to limit elbow flexion. PTA also performed gentle ROM stretching at neck with pillow removed and pt able to lay head on flat bed. Pt was able to tolerate small stretch to L but only for brief bouts. At end of session PRAFOs placed on pt's BLE and pt positioned in bed. Pt left resting comfortably and in NAD.      Therapy Documentation Precautions:  Precautions Precautions: Fall Precaution Comments: Contact precautions & delayed processing Restrictions Weight Bearing Restrictions: No General: PT Amount of Missed Time (min): 20 Minutes PT Missed Treatment Reason: Nursing care Vital Signs: Therapy Vitals Temp: 98 F (36.7 C) Pulse Rate: 98 Resp: 18 BP: (!) 142/101 Patient Position (if appropriate): Lying Oxygen Therapy SpO2: 99 % O2 Device: Room Air   Therapy/Group: Individual Therapy  Carsynn Bethune 12/07/2022, 4:29 PM

## 2022-12-07 NOTE — Progress Notes (Signed)
Occupational Therapy Session Note  Patient Details  Name: Nicole [name] MRN: [id number] Date of Birth: May 28, 1975  Today's Date: 12/07/2022 OT Individual Time: 9629-5284 OT Individual Time Calculation (min): 72 min    Short Term Goals: Week 3:  OT Short Term Goal 1 (Week 3): pt will tolerate tilt table to at least 60* for 3 consecutive sessions as precursor to higher level functional mobility tasks OT Short Term Goal 2 (Week 3): pt will respond to verbal stimuli 3/5 trials to improve attention to task OT Short Term Goal 3 (Week 3): pt will maintain static sitting balance EOB with CGA for at least 10 secs as precursor to higher level ADl tasks  Skilled Therapeutic Interventions/Progress Updates:   LTG's downgraded due to slow progress thus far.    Patient received supine in bed.  Mom at bedside.  Nursing completing care.  Discussion regarding washing patient's hair with medicated shampoo.  Patient assisted to edge of bed, and transferred to wheelchair.  Transported to bathroom and transferred to shower bench (vs rolling shower chair)  Patient able to maintain upright posture sufficiently to be able to be bathed.  Patient with reflexive gesture to head - but at times assisted with motion of shampooing hair.  Less responsive to hand over hand to wash face and chest this session.  Assisted back to wheelchair, and to sink to complete hair care.  Transferred back to bed - worked on rolling left/right, relaxation of head/neck.    Patient has received an elbow brace.  Fit elbow brace on patient's left arm- minor modification to increase available extension.  Reviewed elbow brace (to be worn in bed) rationale and fitting with NT.   Rechecked brace after 1 hour and no skin irritation or pressure areas noted.  Patient resting comfortably with brace in place.   Bed alarm engaged and call bell in bed beside patient.   Therapy Documentation Precautions:  Precautions Precautions: Fall Precaution Comments:  Contact precautions & delayed processing Restrictions Weight Bearing Restrictions: No  Pain: Pain Assessment Faces Pain Scale: Winces with range of motion, arms     Therapy/Group: Individual Therapy  [name] 12/07/2022, 3:18 PM

## 2022-12-07 NOTE — Progress Notes (Signed)
Physical Therapy Weekly Progress Note  Patient Details  Name: Nicole [name] MRN: [id number] Date of Birth: [birth year]  Beginning of progress report period: November 27, 2022 End of progress report period: December 06, 2022   Patient has met 0 of 4 short term goals.  Pt making very slow progress towards goals due to barriers from impairments requiring hospitalization and reduced cognitive functioning. She continues to require DEP physical assist to complete bed mobility, functional transfers. D/t reduced cognition, ambulation has not yet been initiated but with improvements in posturing will be attempted this week. Pt is awaiting SNF placement, so w/c evaluation on hold until d/c location determined as family is unable to care for pt at this level of function.   Patient continues to demonstrate the following deficits muscle weakness and muscle joint tightness, decreased cardiorespiratoy endurance, decreased motor planning, decreased midline orientation, decreased initiation, decreased attention, and decreased awareness, and decreased sitting balance, decreased standing balance, and decreased postural control and therefore will continue to benefit from skilled PT intervention to increase functional independence with mobility.  Patient not progressing toward long term goals.  See goal revision..  Plan of care revisions: All goals downgraded to TotA level.  PT Short Term Goals Week 2:  PT Short Term Goal 1 (Week 2): Pt will initiate bed mobility but require no more than totalA PT Short Term Goal 1 - Progress (Week 2): Not met PT Short Term Goal 2 (Week 2): Pt will maintain sitting balance unsupported for 30 seconds PT Short Term Goal 2 - Progress (Week 2): Not met PT Short Term Goal 3 (Week 2): Pt will demonstrate command follow for ~15% of tasks PT Short Term Goal 3 - Progress (Week 2): Not met PT Short Term Goal 4 (Week 2): Pt will be assessed for custom wheelchair needs PT Short Term Goal 4 - Progress  (Week 2): Not met Week 3:  PT Short Term Goal 1 (Week 3): Pt will initiate bed mobility but require no more than totalA PT Short Term Goal 2 (Week 3): Pt will maintain sitting balance unsupported for 30 seconds PT Short Term Goal 3 (Week 3): Pt will demonstrate command follow for ~15% of tasks.  Skilled Therapeutic Interventions/Progress Updates:  Ambulation/gait training;Discharge planning;Functional mobility training;Psychosocial support;Therapeutic Activities;Visual/perceptual remediation/compensation;Balance/vestibular training;Disease management/prevention;Neuromuscular re-education;Skin care/wound management;Therapeutic Exercise;Wheelchair propulsion/positioning;Cognitive remediation/compensation;DME/adaptive equipment instruction;Pain management;Splinting/orthotics;UE/LE Strength taining/ROM;Community reintegration;Functional electrical stimulation;Patient/family education;UE/LE Coordination activities;Stair training   Therapy Documentation Precautions:  Precautions Precautions: Fall Precaution Comments: Contact precautions & delayed processing Restrictions Weight Bearing Restrictions: No  Therapy/Group: Individual Therapy  Loel Dubonnet PT, DPT, CSRS 12/06/2022, 7:41 AM

## 2022-12-07 NOTE — Plan of Care (Signed)
  Problem: RH Balance Goal: LTG: Patient will maintain dynamic sitting balance (OT) Description: LTG:  Patient will maintain dynamic sitting balance with assistance during activities of daily living (OT) Flowsheets (Taken 12/07/2022 1257) LTG: Pt will maintain dynamic sitting balance during ADLs with: Moderate Assistance - Patient 50 - 74% Note: Long term goal downgraded due to limited progression   Problem: RH Eating Goal: LTG Patient will perform eating w/assist, cues/equip (OT) Description: LTG: Patient will perform eating with assist, with/without cues using equipment (OT) Flowsheets (Taken 12/07/2022 1257) LTG: Pt will perform eating with assistance level of: Moderate Assistance - Patient 50 - 74% Note: Long term goal downgraded due to limited progression   Problem: RH Bathing Goal: LTG Patient will bathe all body parts with assist levels (OT) Description: LTG: Patient will bathe all body parts with assist levels (OT) Flowsheets (Taken 12/07/2022 1257) LTG: Pt will perform bathing with assistance level/cueing: Maximal Assistance - Patient 25 - 49% Note: Long term goal downgraded due to limited progression   Problem: RH Dressing Goal: LTG Patient will perform upper body dressing (OT) Description: LTG Patient will perform upper body dressing with assist, with/without cues (OT). Flowsheets (Taken 12/07/2022 1257) LTG: Pt will perform upper body dressing with assistance level of: Maximal Assistance - Patient 25 - 49% Note: Long term goal downgraded due to limited progression Goal: LTG Patient will perform lower body dressing w/assist (OT) Description: LTG: Patient will perform lower body dressing with assist, with/without cues in positioning using equipment (OT) Flowsheets (Taken 12/07/2022 1257) LTG: Pt will perform lower body dressing with assistance level of: Total Assistance - Patient < 25% Note: Long term goal downgraded due to limited progression   Problem: RH Toileting Goal: LTG  Patient will perform toileting task (3/3 steps) with assistance level (OT) Description: LTG: Patient will perform toileting task (3/3 steps) with assistance level (OT)  Flowsheets (Taken 12/07/2022 1257) LTG: Pt will perform toileting task (3/3 steps) with assistance level: Total Assistance - Patient < 25% Note: Long term goal downgraded due to limited progression   Problem: RH Toilet Transfers Goal: LTG Patient will perform toilet transfers w/assist (OT) Description: LTG: Patient will perform toilet transfers with assist, with/without cues using equipment (OT) Flowsheets (Taken 12/07/2022 1257) LTG: Pt will perform toilet transfers with assistance level of: Maximal Assistance - Patient 25 - 49% Note: Long term goal downgraded due to limited progression   Problem: RH Tub/Shower Transfers Goal: LTG Patient will perform tub/shower transfers w/assist (OT) Description: LTG: Patient will perform tub/shower transfers with assist, with/without cues using equipment (OT) Flowsheets (Taken 12/07/2022 1257) LTG: Pt will perform tub/shower stall transfers with assistance level of: Total Assistance - Patient < 25% Note: Long term goal downgraded due to limited progression

## 2022-12-07 NOTE — Patient Care Conference (Signed)
Inpatient RehabilitationTeam Conference and Plan of Care Update Date: 12/06/2022   Time: 11:22 AM    Patient Name: Nicole [name]      Medical Record Number: [id number]  Date of Birth: [birth year] Sex: Female         Room/Bed: 4W09C/4W09C-01 Payor Info: Payor: [employer] / Plan: [employer] / Product Type: *No Product type* /    Admit Date/Time:  11/18/2022  3:15 PM  Primary Diagnosis:  Hypoxic brain injury [facility])  Hospital Problems: Principal Problem:   Hypoxic brain injury [facility])    Expected Discharge Date: Expected Discharge Date:  (SNF pending)  Team Members Present: Physician leading conference: Dr. Sula Soda Social Worker Present: Dossie Der, LCSW Nurse Present: Chana Bode, RN PT Present: Casimiro Needle, PT OT Present: Bretta Bang, OT SLP Present: Everardo Pacific, SLP PPS Coordinator present : Fae Pippin, SLP     Current Status/Progress Goal Weekly Team Focus  Bowel/Bladder   Incontinent of bladder/bowel LBM   Regain continence   Time toileting program QS and PRN    Swallow/Nutrition/ Hydration   Dysphagia 3 and thin liquid with ful supervision.   Supervision  family education    ADL's   dependent in BADL, Starting to participate following hand over hand cueing - brief response   Mod assist   improve focused attention, visual attention, participation in BADL    Mobility   Dependent care for all functional mobility. Nonverbal and no attempts at vocalization noted. Severely flexed posturing.   MaxA/ TotA  - will downgrade all to TotA  Barriers: extremely low awareness, flexed posturing, no/ rare volitional movements /// Continued focus on bed mobility, sitting balance, improving alertness and volitional movements, functional transfers, stretching into extension    Communication   max - total A for following single directions, answering questions,   max A   establish multimodal communication system    Safety/Cognition/  Behavioral Observations  total A limited to no initiation, presents with bite reflex in response to items placed in her hands   max A   sustained attention, initiation of functional task    Pain   Patient is nonverbal,   painfree and comfort   Assess nonverbal gestures QS/PRN    Skin   Has unstageable aea to buttocks continue daily             Discharge Planning:  Working with Mom on applying for SSD/SSI. Mom pursuing guardianship court date 8/29. Searching for SNF to offer a bed. Mom aware may be far away. Barrier alos her insurance newer and several SNF trying to figure out if take it or not   Team Discussion: Patient with increased spasticity; MD to increase Baclofen, on Amantadine.   Patient on target to meet rehab goals: Stretching session have yielded improved neck movement but continues to be total assist for mobility. Up to the tilt in space wheelchair  with +2 assist. Currently on a dysphagia 3, thin liquid diet and has been working on self feeding. Requires max - total assist for communication and use of mult modal communication systems. Working on sustained attention.  *See Care Plan and progress notes for long and short-term goals.   Revisions to Treatment Plan:  KREG bed for positioning Elbow brace consult   Teaching Needs: Safety, medications, skin care/wound care, transfers, toileting, positioning, stretching exercises, etc.  Current Barriers to Discharge: Home enviroment access/layout, Incontinence, Wound care, and Lack of/limited family support  Possible Resolutions to Barriers: SNF recommended SW  assist mother in seeking guardianship     Medical Summary Current Status: unstageable wound on the sacrum, bilateral heel wounds, spasticity, impaired inititation, HTN, eczema of scalp, hypoxic brain injury, suboptimal vitamin D  Barriers to Discharge: Medical stability;Complicated Wound  Barriers to Discharge Comments: unstageable wound on the sacrum,  bilateral heel wounds, spasticity, impaired initiation, HTN, eczema of scalp, hypoxic brain injury, suboptimal vitamin D Possible Resolutions to [employer]: continue daily wound care, increase baclofen to 20mg  TID, conitnue amantaidine 100mg  BID, d/c midodrine, selsun blue ordered, repeat brain imaging ordered and is stable, vitamin D supplement ordered   Continued Need for Acute Rehabilitation Level of Care: The patient requires daily medical management by a physician with specialized training in physical medicine and rehabilitation for the following reasons: Direction of a multidisciplinary physical rehabilitation program to maximize functional independence : Yes Medical management of patient stability for increased activity during participation in an intensive rehabilitation regime.: Yes Analysis of laboratory values and/or radiology reports with any subsequent need for medication adjustment and/or medical intervention. : Yes   I attest that I was present, lead the team conference, and concur with the assessment and plan of the team.   Chana Bode B 12/07/2022, 10:13 AM

## 2022-12-07 NOTE — Progress Notes (Signed)
Speech Language Pathology Daily Session Note  Patient Details  Name: Nicole [name] MRN: [id number] Date of Birth: [birth year]  Today's Date: 12/07/2022 SLP Individual Time: 1115-1200 SLP Individual Time Calculation (min): 45 min  Short Term Goals: Week 3: SLP Short Term Goal 1 (Week 3): Patient will utilize multimodal communication to answer basic yes/no questions with Max A multimodal cues in 10% of opportunities SLP Short Term Goal 2 (Week 3): Patient will follow 1-step commands within a context in 10% of opportunities with Max A multimodal cues. SLP Short Term Goal 3 (Week 3): Pt will present w/ appropriate response to functional stimuli (i.e. accept spoon, grasp basic item appropriately, response to tactile stimuli) 10% of the time w/ maxA multimodal cues  Skilled Therapeutic Interventions:   Pt greeted at bedside and SLP facilitated tx tasks targeting cognition. Pt flush upon SLP arrival w/ perspiration noted on her forehead and upper lip. SLP removed the pt's thick blanket and vitals were taken. HR initially 109 and BP 123/82, though HR improved to 78 as BP reduced to 108/75. Provided pt w/ a cool wet washcloth in attempts to help cool her off, which appeared to help as the perspiration and flushed skin cleared. Notified nursing of the situation. She benefited from hand over hand assist to wipe her face with the washcloth. During oral care, pt was able to grip the toothbrush and continue with the brushing motion for ~5 seconds on the R side before bite reflex was noted. SLP assisted to complete remainder of oral care. She appeared to be scanning he environment, so SLP opened her windows to provide some visual stimuli. Pt was able to maintain eye contact in the direction of the music for ~40 seconds on her R side. No attempt to gaze/turn towards the music on her L side today. Pt did note to turn her eyes x1 and head x2 to locate SLP when she moved to the L side of the room, though unable to sustain  gaze for greater than 5-10 seconds. Pt left in bed with alarm set and call light within reach.   Pain Pain Assessment Pain Scale: Faces Faces Pain Scale: No hurt  Therapy/Group: Individual Therapy  [name] 12/07/2022, 10:25 AM

## 2022-12-07 NOTE — Progress Notes (Addendum)
Physical Therapy Session Note  Patient Details  Name: Nicole [name] MRN: [id number] Date of Birth: Jun 29, 1975  Today's Date: 12/07/2022 PT Individual Time: 1125-1205 PT Individual Time Calculation (min): 40 min  Today's Date: 12/07/2022 PT Missed Time: 20 Minutes Missed Time Reason: Nursing care  Short Term Goals: Week 3:  PT Short Term Goal 1 (Week 3): Pt will initiate bed mobility but require no more than totalA PT Short Term Goal 2 (Week 3): Pt will maintain sitting balance unsupported for 30 seconds PT Short Term Goal 3 (Week 3): Pt will demonstrate command follow for ~15% of tasks.  Skilled Therapeutic Interventions/Progress Updates: Patient supine in bed on entrance to room. SLP conversed with PTA prior to session stating that pt was sweating in bed (vitals WNL) and that the room [facility] was lowered and pt with only a sheet instead of blanket. PTA entered room for session later and saw pt sweating with sheet and pillow (mitten on R UE as well due to reports that pt was scratching at face). Vitals checked and found to be WNL, and RN and NT arrived at that time to check pt temperature and change clothing/sheets and chuck that were drenched in sweat (pt with BM in brief as well).  Session focused on passive ROM on B UE in order to stretch elbow flexors for contraction prevention (PTA lightly messaging biceps, and providing tactile stimulation to elbow extensors - increased time and movement to get out of flexed posture on B UE's). PTA performed PNF passive ROM on B LE's to stretch and maintain ROM.   Patient supine in bed at end of session with brakes locked, bed alarm set, mother present and all needs within reach.      Therapy Documentation Precautions:  Precautions Precautions: Fall Precaution Comments: Contact precautions & delayed processing Restrictions Weight Bearing Restrictions: No  Vitals: checked supine in bed BP - 113/87 (95) HR - 86bpm  Therapy/Group: Individual  Therapy  Shekita Boyden PTA 12/07/2022, 12:42 PM

## 2022-12-07 NOTE — Progress Notes (Signed)
Physical Therapy Session Note  Patient Details  Name: Nicole [name] MRN: [id number] Date of Birth: 1976-01-28  Today's Date: 12/07/2022 PT Individual Time:  0806-0905  PT individual Time (calculation): 59 min  Short Term Goals: Week 1:  PT Short Term Goal 1 (Week 1): pt will perform sit<>stand with +2 max A PT Short Term Goal 1 - Progress (Week 1): Not met PT Short Term Goal 2 (Week 1): pt will initiate gait training PT Short Term Goal 2 - Progress (Week 1): Not met PT Short Term Goal 3 (Week 1): Pt will perform bed<>chair transfer with +2 max A PT Short Term Goal 3 - Progress (Week 1): Not met Week 2:  PT Short Term Goal 1 (Week 2): Pt will initiate bed mobility but require no more than totalA PT Short Term Goal 2 (Week 2): Pt will maintain sitting balance unsupported for 30 seconds PT Short Term Goal 3 (Week 2): Pt will demonstrate command follow for ~15% of tasks PT Short Term Goal 4 (Week 2): Pt will be assessed for custom wheelchair needs  Skilled Therapeutic Interventions/Progress Updates:  Patient supine in bed asleep on entrance to room. Patient requires time to fully wake and demo alertness to PT.   Patient with no pain complaint at start of session. Continues to demo facial grimace with discomfort in an any bodily movements against resting tone. Alleviates with time in position or repositioning and attempts to decrease tone.   Therapeutic Activity: Dressing initiated in supine and requiring TotA/ DEP for all dressing with no volitional movements with vc/ tc. Bed Mobility: Pt performed supine -> sit with DEP. VC/ tc provided throughout. Transfers: Pt performed sit<>stand in order to complete LB dressing. Squat pivot transfers throughout session with TotA with pt providing assist only d/t tone in LEs. Provided verbal cues for technique and to improve awareness.  Neuromuscular Re-ed: NMR facilitated during session with focus on awareness, decreasing tone. With +2, pt positioned  into modified quadriped positioning on t-ball. T-ball providing safe positioning for increased pressure to anterior body and time in more prone positioning.   Requires DEP +2 for safety. [name] BUEs positioned around large t-ball in order to decrease flexor tone and improve positioning. Rolled back to knees for supported kneeling. Rocked fwd/ back and R/ L for attempt to decrease tone and improve comfort.  Rocked forward in attempt to initiate reflexive protective reach to prevent forward roll to head, but no movements elicited.   Returned to chair by first rolling pt off ball into sidelying. Then up to long sitting for HS stretch. Then BLEs flexed for boost up into TIS w/c using Bil feet for floor support.   NMR performed for improvements in motor control and coordination, balance, sequencing, judgement, and self confidence/ efficacy in performing all aspects of mobility at highest level of independence.   Patient seated upright in TIS w/c  at end of session with brakes locked, belt alarm set, pillow prop to BUE and BLE for improved positioning toward neutral, and all needs within reach. NT notified as to pt's disposition.    Therapy Documentation Precautions:  Precautions Precautions: Fall Precaution Comments: Contact precautions & delayed processing Restrictions Weight Bearing Restrictions: No  Pain:  Discomfort noted during passive movements to ranges for all limbs, as per usual. Alleviates with time in positioning or repositioning.   Therapy/Group: Individual Therapy  Loel Dubonnet PT, DPT, CSRS 12/06/2022, 6:38 PM

## 2022-12-08 LAB — CREATININE, SERUM: GFR, Estimated: >60 mL/min (ref >60)

## 2022-12-08 NOTE — Progress Notes (Signed)
Physical Therapy Session Note  Patient Details  Name: Nicole [name] MRN: [id number] Date of Birth: Sep 23, 1975  {CHL IP REHAB PT TIME CALCULATION:[id number]}  Short Term Goals: Week 2:  PT Short Term Goal 1 (Week 2): Pt will initiate bed mobility but require no more than totalA PT Short Term Goal 1 - Progress (Week 2): Not met PT Short Term Goal 2 (Week 2): Pt will maintain sitting balance unsupported for 30 seconds PT Short Term Goal 2 - Progress (Week 2): Not met PT Short Term Goal 3 (Week 2): Pt will demonstrate command follow for ~15% of tasks PT Short Term Goal 3 - Progress (Week 2): Not met PT Short Term Goal 4 (Week 2): Pt will be assessed for custom wheelchair needs PT Short Term Goal 4 - Progress (Week 2): Not met Week 3:  PT Short Term Goal 1 (Week 3): Pt will initiate bed mobility but require no more than totalA PT Short Term Goal 2 (Week 3): Pt will maintain sitting balance unsupported for 30 seconds PT Short Term Goal 3 (Week 3): Pt will demonstrate command follow for ~15% of tasks.  Skilled Therapeutic Interventions/Progress Updates:      Therapy Documentation Precautions:  Precautions Precautions: Fall Precaution Comments: Contact precautions & delayed processing Restrictions Weight Bearing Restrictions: No General:   Vital Signs:   Pain:   Therapy/Group: Individual Therapy  Loel Dubonnet PT, DPT, CSRS 12/08/2022, 9:14 AM

## 2022-12-08 NOTE — Progress Notes (Signed)
Speech Language Pathology Daily Session Note  Patient Details  Name: Nicole [name] MRN: [id number] Date of Birth: Mar 31, 1976  Today's Date: 12/08/2022 SLP Individual Time: 1309-1405 SLP Individual Time Calculation (min): 56 min  Short Term Goals: Week 3: SLP Short Term Goal 1 (Week 3): Patient will utilize multimodal communication to answer basic yes/no questions with Max A multimodal cues in 10% of opportunities SLP Short Term Goal 2 (Week 3): Patient will follow 1-step commands within a context in 10% of opportunities with Max A multimodal cues. SLP Short Term Goal 3 (Week 3): Pt will present w/ appropriate response to functional stimuli (i.e. accept spoon, grasp basic item appropriately, response to tactile stimuli) 10% of the time w/ maxA multimodal cues  Skilled Therapeutic Interventions:   Pt, mom, and friend greeted at bedside, though they left shortly after SLP arrival. Pt was reclined in her TIS chair, awake/alert upon SLP arrival. SLP provided pt w/ simple biographical questions to answer and challenged pt to utilize binary options to select answer. She benefited from totalA to complete task 2* initiation deficits and restricted RUE (elbow splint). SLP then facilitated written expression task w/ tracing. SLP provided hand over hand assist to partially trace a circle and draw on the paper. When provided w/ pt preferred music, she appeared to attempt to tap along to the beat w/ SLP and sustain for 2-3 taps. SLP also provided pt w/ various functional objects (toothbrush, brush, etc) and provided initial hand over hand assist to demonstrate appropriate use of the object. After initial demonstration for hairbrush, pt moved her head towards the brush [facility] and seemingly moved her read in an up/down rocking position x2 in attempt to brush her hair. Of note, this motion was atypical of her usual head motion. At the end of the tx session, pt was left in her chair with the alarm set. Recommend cont ST.    Pain  Unable to report pain. Appeared comfortable.  Therapy/Group: Individual Therapy  [name] 12/08/2022, 7:52 AM

## 2022-12-08 NOTE — Progress Notes (Signed)
Physical Therapy Session Note  Patient Details  Name: Nicole [name] MRN: [id number] Date of Birth: Jul 13, 1975  Today's Date: 12/08/2022 PT Individual Time: 0917-1010 PT Individual Time Calculation (min): 53 min   Short Term Goals: Week 2:  PT Short Term Goal 1 (Week 2): Pt will initiate bed mobility but require no more than totalA PT Short Term Goal 1 - Progress (Week 2): Not met PT Short Term Goal 2 (Week 2): Pt will maintain sitting balance unsupported for 30 seconds PT Short Term Goal 2 - Progress (Week 2): Not met PT Short Term Goal 3 (Week 2): Pt will demonstrate command follow for ~15% of tasks PT Short Term Goal 3 - Progress (Week 2): Not met PT Short Term Goal 4 (Week 2): Pt will be assessed for custom wheelchair needs PT Short Term Goal 4 - Progress (Week 2): Not met Week 3:  PT Short Term Goal 1 (Week 3): Pt will initiate bed mobility but require no more than totalA PT Short Term Goal 2 (Week 3): Pt will maintain sitting balance unsupported for 30 seconds PT Short Term Goal 3 (Week 3): Pt will demonstrate command follow for ~15% of tasks.  Skilled Therapeutic Interventions/Progress Updates:  Patient supine in bed on entrance to room and sleeping comfortably. Patient easily roused to full alertness.    Patient with no indication of pain at start of session. Does continue to demo light facial grimace  with all mobility and body movements.   Therapeutic Activity: Bed Mobility: Brief noted to be heavily soiled and initiated bed mobility and cognitive awareness training in order to change brief and dress for day.  Doffed safety mitt to R hand and provided verbal instructions prior to all movements, starting with placing BLE in hooklying position and bringing RUE over chest in order to facilitate log roll to L. Facilitated head turn to L side which is resisted d/t increased tone in order to initiate roll.   ***  Pt performed supine <> sit with ***. VC/ tc required for  ***. Transfers: Pt performed sit<>stand and stand pivot transfers throughout session with ***. Provided verbal cues for***.  Gait Training:  Pt ambulated *** ft using *** with ***. Demonstrated ***. Provided vc/ tc for ***.  Wheelchair Mobility:  Pt propelled wheelchair *** feet with ***. Provided vc/ tc for ***.  Neuromuscular Re-ed: NMR facilitated during session with focus on***. Pt guided in ***. NMR performed for improvements in motor control and coordination, balance, sequencing, judgement, and self confidence/ efficacy in performing all aspects of mobility at highest level of independence.   Verbal attempts to have pt turn head to L side   Patient seated upright in TIS w/c at end of session with brakes locked, belt alarm set, and all needs within reach. Pillow prop to BUE for more scaption positioning and between knees to prevent increased tone into adduction.    Therapy Documentation Precautions:  Precautions Precautions: Fall Precaution Comments: Contact precautions & delayed processing Restrictions Weight Bearing Restrictions: No  Pain:    Therapy/Group: Individual Therapy  Loel Dubonnet PT, DPT, CSRS 12/08/2022, 9:13 AM

## 2022-12-08 NOTE — Progress Notes (Signed)
Occupational Therapy Session Note  Patient Details  Name: Nicole [name] MRN: [id number] Date of Birth: 11/24/1975  Today's Date: 12/08/2022 OT Individual Time: 7829-5621 OT Individual Time Calculation (min): 56 min    Short Term Goals: Week 3:  OT Short Term Goal 1 (Week 3): pt will tolerate tilt table to at least 60* for 3 consecutive sessions as precursor to higher level functional mobility tasks OT Short Term Goal 2 (Week 3): pt will respond to verbal stimuli 3/5 trials to improve attention to task OT Short Term Goal 3 (Week 3): pt will maintain static sitting balance EOB with CGA for at least 10 secs as precursor to higher level ADl tasks  Skilled Therapeutic Interventions/Progress Updates:   Patient received up in wheelchair.  Elbow brace on left arm - significant increase in passive elbow extension following brace wear.  Transported to gym for increased stimulation.  Mom attended therapy this am.  Patient transferred to mat table with max/total assist.  Able to maintain static sitting balance x 5 seconds without assistance.  Facilitated BLE on floor in sitting to promote stable base of support.  Supported Ue's on elevated table to reduce weight on anterior chest, reduce pull o fhead forward.  Facilitated gentle trunk motion - lateral flexion, rotation, extension with gentle head support and alignment throughout.  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  Patient able to obtain pencil from table, and make repetitive strokes of pencil on paper.  Used transfer board to get back to wheelchair with max assist - but with emphasis on repeated weight through well aligned BLE.   Patient left up in wheelchair with safety belt, lap tray  and elbow brace on R arm.  Mom at bedside.    Therapy Documentation Precautions:  Precautions Precautions: Fall Precaution Comments: Contact precautions & delayed processing Restrictions Weight Bearing Restrictions: No Pain:  Grimaces with passive range of motion     Therapy/Group: Individual Therapy  [name] 12/08/2022, 12:46 PM

## 2022-12-08 NOTE — Progress Notes (Signed)
PROGRESS NOTE   Subjective/Complaints: No new complaints this morning Patient's chart reviewed- No issues reported overnight Vitals signs stable   WUJ:WJXBJY to obtain due to cognitive/behavioral, +area of redness between left thumb and index finger as per nursing, +spasticity as per therapy    Objective:   No results found. No results for input(s): "WBC", "HGB", "HCT", "PLT" in the last 72 hours.   Recent Labs    12/08/22 0621  CREATININE 0.52      Intake/Output Summary (Last 24 hours) at 12/08/2022 1001 Last data filed at 12/08/2022 0848 Gross per 24 hour  Intake 1480 ml  Output --  Net 1480 ml      Pressure Injury 10/31/22 Buttocks Left Unstageable - Full thickness tissue loss in which the base of the injury is covered by slough (yellow, tan, gray, green or brown) and/or eschar (tan, brown or black) in the wound bed. (Active)  10/31/22 2000  Location: Buttocks  Location Orientation: Left  Staging: Unstageable - Full thickness tissue loss in which the base of the injury is covered by slough (yellow, tan, gray, green or brown) and/or eschar (tan, brown or black) in the wound bed.  Wound Description (Comments):   Present on Admission: Yes     Pressure Injury 11/18/22 Toe (Comment  which one) Anterior;Right Deep Tissue Pressure Injury - Purple or maroon localized area of discolored intact skin or blood-filled blister due to damage of underlying soft tissue from pressure and/or shear. small are (Active)  11/18/22 1300  Location: Toe (Comment  which one)  Location Orientation: Anterior;Right  Staging: Deep Tissue Pressure Injury - Purple or maroon localized area of discolored intact skin or blood-filled blister due to damage of underlying soft tissue from pressure and/or shear.  Wound Description (Comments): small area on right great toe metatarsal  Present on Admission: Yes    Physical Exam: Vital Signs Blood  pressure (!) 149/81, pulse 86, temperature 98.3 F (36.8 C), resp. rate 16, height 5\' 1"  (1.549 m), weight 47.4 kg, SpO2 98%.  Gen: no distress, normal appearing. BMI 19.74 HEENT: oral mucosa pink and moist, NCAT.  Scaling rash on posterior scalp. Cardio: heart rate has normalized Chest: normal effort, normal rate of breathing Abd: soft, non-distended Ext: no edema Psych: unable to assess Skin: Macerated left hand - healing  + Scaling dried skin on head - stable, stage 2 to right toe, area of redness between left thumb and index finger  Neurologic: Does not respond to orientation questions, does not track, does not follow cues. Right upper extremity antigravity to scratch head, otherwise not cooperative with exam. Sensory exam unable to assess other than grimace to pinch of bilateral fingers and toes  Tone: MAS 3 right elbow, right knee, and right hip; MAS 3 L elbow otherwise resists ROM Cerebellar exam unable to assess  Musculoskeletal: reduced bilateral shoulder and elbow ROM due to increased tone  Elbow brace in place   Assessment/Plan: 1. Functional deficits which require 3+ hours per day of interdisciplinary therapy in a comprehensive inpatient rehab setting. Physiatrist is providing close team supervision and 24 hour management of active medical problems listed below. Physiatrist and rehab team continue to assess  barriers to discharge/monitor patient progress toward functional and medical goals  Care Tool:  Bathing    Body parts bathed by patient: Chest, Face   Body parts bathed by helper: Right arm, Left arm, Chest, Abdomen, Front perineal area, Buttocks, Right upper leg, Left upper leg, Right lower leg, Left lower leg, Face Body parts n/a: Right arm, Right upper leg, Left arm, Left upper leg, Chest, Abdomen, Right lower leg, Front perineal area, Left lower leg, Buttocks, Face   Bathing assist Assist Level: Dependent - Patient 0%     Upper Body Dressing/Undressing Upper  body dressing   What is the patient wearing?: Hospital gown only    Upper body assist Assist Level: Dependent - Patient 0%    Lower Body Dressing/Undressing Lower body dressing      What is the patient wearing?: Incontinence brief     Lower body assist Assist for lower body dressing: Dependent - Patient 0%     Toileting Toileting Toileting Activity did not occur (Clothing management and hygiene only): N/A (no void or bm)  Toileting assist Assist for toileting: Dependent - Patient 0%     Transfers Chair/bed transfer  Transfers assist  Chair/bed transfer activity did not occur: Safety/medical concerns  Chair/bed transfer assist level: Maximal Assistance - Patient 25 - 49%     Locomotion Ambulation   Ambulation assist   Ambulation activity did not occur: Safety/medical concerns          Walk 10 feet activity   Assist  Walk 10 feet activity did not occur: Safety/medical concerns        Walk 50 feet activity   Assist Walk 50 feet with 2 turns activity did not occur: Safety/medical concerns         Walk 150 feet activity   Assist Walk 150 feet activity did not occur: Safety/medical concerns         Walk 10 feet on uneven surface  activity   Assist Walk 10 feet on uneven surfaces activity did not occur: Safety/medical concerns         Wheelchair     Assist Is the patient using a wheelchair?: Yes Type of Wheelchair: Manual (tilt in space)    Wheelchair assist level: Dependent - Patient 0%      Wheelchair 50 feet with 2 turns activity    Assist    Wheelchair 50 feet with 2 turns activity did not occur: Safety/medical concerns   Assist Level: Dependent - Patient 0%   Wheelchair 150 feet activity     Assist  Wheelchair 150 feet activity did not occur: Safety/medical concerns   Assist Level: Dependent - Patient 0%   Blood pressure (!) 149/81, pulse 86, temperature 98.3 F (36.8 C), resp. rate 16, height 5\' 1"   (1.549 m), weight 47.4 kg, SpO2 98%.  Medical Problem List and Plan: 1. 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 Extubated 11/05/2022 Fluctuating level of alertness             -patient may  shower  Elbow splint and PRAFOs ordered             -ELOS/Goals: min/modA but prognosis is guarded due to severe cognitive deficits goals 28-30d  -Continue CIR therapies including PT, OT, and SLP    Craig bed ordered  Mother updated  Palliative care consulted, discussed home with hospice but patient is not a candidate  MRI brain ordered given decline in speech-->no acute abnormality evident  Observed  therapy, discussed disposition with SW, applying for nursing home placement  -discussed with PT and SW applying for nursing homes  -obtaining guardianship discussed with mother, form signed for her  Team conference 7/24  2.  Impaired mobility: continue Lovenox             -antiplatelet therapy: N/A 3. Pain Management: Tylenol as needed 4. Mood/Behavior/Sleep: Provide emotional support             -antipsychotic agents: N/A  5. Neuropsych/cognition: This patient is not capable of making decisions on her own behalf. 6. Left buttock unstageable ulcer.  Continue Medihoney to buttock wound daily cover with foam dressing.  Changing dressing every 3 days or as needed soiling   - hydrocortisone cream to scalp for rash 7/20  7. Fluids/Electrolytes/Nutrition: Routine in and outs with follow-up chemistries  -Labs 7/21 stable.  8.  Cavitary right lower lobe pneumonia likely aspiration pneumonia/MRSA pneumonia.  Continue course of Zyvox as indicated per infectious disease  -Labs 7/21 stable.  9.  History of drug abuse.  Positive cocaine on urine drug screen.  Provide counseling  10.  AKI/hypovolemia and ATN.  Renal function much improved.  Repeat Cr today.    Latest Ref Rng & Units 12/08/2022    6:21 AM 12/03/2022    7:18 AM 12/01/2022    5:00  AM  BMP  Glucose 70 - 99 mg/dL  92    BUN 6 - 20 mg/dL  19    Creatinine 1.61 - 1.00 mg/dL 0.96  0.45  4.09   Sodium 135 - 145 mmol/L  137    Potassium 3.5 - 5.1 mmol/L  3.7    Chloride 98 - 111 mmol/L  103    CO2 22 - 32 mmol/L  24    Calcium 8.9 - 10.3 mg/dL  8.9      11.  Mild transaminitis with rhabdomyolysis.  CK reviewed and normalized  12.  Hypotension. Resolved, d/c midodrine  13. Impaired initiation: continue amantadine back to 100mg  BID  14. Suboptimal vitamin D: continue 1,000U D3 daily  15. Spasticity:   -Remains extremely tight, increase baclofen to 20 mg 3 times daily. Appears to have increased sedation with this increase, continue to monitor. Continue wrist, elbow brace, and PRAFOs  16. Bowel and bladder incontinence: continue bowel and bladder program   17. Left hand skin maceration: Eucerin ordered, discussed hand split with OT  18. Tachycardia: decreased amantadine back to 100mg  BID, continue this dose  19. HTN: decrease amantadine to 100mg  BID. BP reviewed and has stabilized, continue this dose    12/08/2022    4:40 AM 12/07/2022    8:13 PM 12/07/2022    3:10 PM  Vitals with BMI  Systolic 149 117 811  Diastolic 81 80 101  Pulse 86 91 98    20. Area of redness between left thumb and index finger: continue foam dressing  21. Scalp eczema: selsun blue ordered, continue  LOS: 20 days A FACE TO FACE EVALUATION WAS PERFORMED  Matthewjames Petrasek P Christropher Gintz 12/08/2022, 10:01 AM

## 2022-12-09 DIAGNOSIS — I10 Essential (primary) hypertension: Secondary | ICD-10-CM

## 2022-12-09 DIAGNOSIS — R000 Tachycardia, unspecified: Secondary | ICD-10-CM

## 2022-12-09 DIAGNOSIS — N179 Acute kidney failure, unspecified: Secondary | ICD-10-CM

## 2022-12-09 NOTE — Progress Notes (Signed)
PROGRESS NOTE   Subjective/Complaints: No issues overnight noted through chart review.  No new concerns elicited.  ATF:TDDUKG to obtain due to cognitive/behavioral, +area of redness between left thumb and index finger as per nursing, +spasticity as per therapy    Objective:   No results found. No results for input(s): "WBC", "HGB", "HCT", "PLT" in the last 72 hours.   Recent Labs    12/08/22 0621  CREATININE 0.52      Intake/Output Summary (Last 24 hours) at 12/09/2022 1257 Last data filed at 12/09/2022 0840 Gross per 24 hour  Intake 940 ml  Output --  Net 940 ml      Pressure Injury 10/31/22 Buttocks Left Unstageable - Full thickness tissue loss in which the base of the injury is covered by slough (yellow, tan, gray, green or brown) and/or eschar (tan, brown or black) in the wound bed. (Active)  10/31/22 2000  Location: Buttocks  Location Orientation: Left  Staging: Unstageable - Full thickness tissue loss in which the base of the injury is covered by slough (yellow, tan, gray, green or brown) and/or eschar (tan, brown or black) in the wound bed.  Wound Description (Comments):   Present on Admission: Yes     Pressure Injury 11/18/22 Toe (Comment  which one) Anterior;Right Deep Tissue Pressure Injury - Purple or maroon localized area of discolored intact skin or blood-filled blister due to damage of underlying soft tissue from pressure and/or shear. small are (Active)  11/18/22 1300  Location: Toe (Comment  which one)  Location Orientation: Anterior;Right  Staging: Deep Tissue Pressure Injury - Purple or maroon localized area of discolored intact skin or blood-filled blister due to damage of underlying soft tissue from pressure and/or shear.  Wound Description (Comments): small area on right great toe metatarsal  Present on Admission: Yes    Physical Exam: Vital Signs Blood pressure 135/65, pulse (!) 58,  temperature 98 F (36.7 C), resp. rate 17, height 5\' 1"  (1.549 m), weight 47.4 kg, SpO2 100%.  Gen: no distress, lying in bed, appears comfortable HEENT: oral mucosa pink and moist, NCAT.  Scaling rash on posterior scalp. Cardio: RRR Chest: CTAB, normal effort, normal rate of breathing Abd: soft, NT, non-distended Ext: no edema Psych: unable to assess Skin: Macerated left hand - healing  + Scaling dried skin on head - stable, stage 2 to right toe, area of redness between left thumb and index finger  Neurologic: Does not respond to orientation questions, does not track, does not follow cues. Right upper extremity antigravity to scratch head, otherwise not cooperative with exam. Sensory exam unable to assess other than grimace to pinch of bilateral fingers and toes  Tone: MAS 3 right elbow, right knee, and right hip; MAS 3 L elbow otherwise resists ROM Cerebellar exam unable to assess  Musculoskeletal: reduced bilateral shoulder and elbow ROM due to increased tone  Elbow brace in place   Assessment/Plan: 1. Functional deficits which require 3+ hours per day of interdisciplinary therapy in a comprehensive inpatient rehab setting. Physiatrist is providing close team supervision and 24 hour management of active medical problems listed below. Physiatrist and rehab team continue to assess barriers to discharge/monitor patient  progress toward functional and medical goals  Care Tool:  Bathing    Body parts bathed by patient: Chest, Face   Body parts bathed by helper: Right arm, Left arm, Chest, Abdomen, Front perineal area, Buttocks, Right upper leg, Left upper leg, Right lower leg, Left lower leg, Face Body parts n/a: Right arm, Right upper leg, Left arm, Left upper leg, Chest, Abdomen, Right lower leg, Front perineal area, Left lower leg, Buttocks, Face   Bathing assist Assist Level: Dependent - Patient 0%     Upper Body Dressing/Undressing Upper body dressing   What is the patient  wearing?: Hospital gown only    Upper body assist Assist Level: Dependent - Patient 0%    Lower Body Dressing/Undressing Lower body dressing      What is the patient wearing?: Incontinence brief     Lower body assist Assist for lower body dressing: Dependent - Patient 0%     Toileting Toileting Toileting Activity did not occur (Clothing management and hygiene only): N/A (no void or bm)  Toileting assist Assist for toileting: Dependent - Patient 0%     Transfers Chair/bed transfer  Transfers assist  Chair/bed transfer activity did not occur: Safety/medical concerns  Chair/bed transfer assist level: Maximal Assistance - Patient 25 - 49%     Locomotion Ambulation   Ambulation assist   Ambulation activity did not occur: Safety/medical concerns          Walk 10 feet activity   Assist  Walk 10 feet activity did not occur: Safety/medical concerns        Walk 50 feet activity   Assist Walk 50 feet with 2 turns activity did not occur: Safety/medical concerns         Walk 150 feet activity   Assist Walk 150 feet activity did not occur: Safety/medical concerns         Walk 10 feet on uneven surface  activity   Assist Walk 10 feet on uneven surfaces activity did not occur: Safety/medical concerns         Wheelchair     Assist Is the patient using a wheelchair?: Yes Type of Wheelchair: Manual (tilt in space)    Wheelchair assist level: Dependent - Patient 0%      Wheelchair 50 feet with 2 turns activity    Assist    Wheelchair 50 feet with 2 turns activity did not occur: Safety/medical concerns   Assist Level: Dependent - Patient 0%   Wheelchair 150 feet activity     Assist  Wheelchair 150 feet activity did not occur: Safety/medical concerns   Assist Level: Dependent - Patient 0%   Blood pressure 135/65, pulse (!) 58, temperature 98 F (36.7 C), resp. rate 17, height 5\' 1"  (1.549 m), weight 47.4 kg, SpO2  100%.  Medical Problem List and Plan: 1. 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 Extubated 11/05/2022 Fluctuating level of alertness             -patient may  shower  Elbow splint and PRAFOs ordered             -ELOS/Goals: min/modA but prognosis is guarded due to severe cognitive deficits goals 28-30d  -Continue CIR therapies including PT, OT, and SLP    Craig bed ordered  Mother updated  Palliative care consulted, discussed home with hospice but patient is not a candidate  MRI brain ordered given decline in speech-->no acute abnormality evident  Observed therapy, discussed disposition with SW, applying for nursing home placement  -discussed with PT and SW applying for nursing homes  -obtaining guardianship discussed with mother, form signed for her  Team conference 7/24  2.  Impaired mobility: continue Lovenox             -antiplatelet therapy: N/A 3. Pain Management: Tylenol as needed 4. Mood/Behavior/Sleep: Provide emotional support             -antipsychotic agents: N/A  5. Neuropsych/cognition: This patient is not capable of making decisions on her own behalf. 6. Left buttock unstageable ulcer.  Continue Medihoney to buttock wound daily cover with foam dressing.  Changing dressing every 3 days or as needed soiling   - hydrocortisone cream to scalp for rash 7/20  7. Fluids/Electrolytes/Nutrition: Routine in and outs with follow-up chemistries  -Labs 7/21 stable.  8.  Cavitary right lower lobe pneumonia likely aspiration pneumonia/MRSA pneumonia.  Continue course of Zyvox as indicated per infectious disease  -Labs 7/21 stable.  9.  History of drug abuse.  Positive cocaine on urine drug screen.  Provide counseling  10.  AKI/hypovolemia and ATN.  Renal function much improved.  Repeat Cr today.  -7/27 Cr stable 0.52 yesterday    Latest Ref Rng & Units 12/08/2022    6:21 AM 12/03/2022    7:18 AM  12/01/2022    5:00 AM  BMP  Glucose 70 - 99 mg/dL  92    BUN 6 - 20 mg/dL  19    Creatinine 1.61 - 1.00 mg/dL 0.96  0.45  4.09   Sodium 135 - 145 mmol/L  137    Potassium 3.5 - 5.1 mmol/L  3.7    Chloride 98 - 111 mmol/L  103    CO2 22 - 32 mmol/L  24    Calcium 8.9 - 10.3 mg/dL  8.9      11.  Mild transaminitis with rhabdomyolysis.  CK reviewed and normalized  12.  Hypotension. Resolved, d/c midodrine  13. Impaired initiation: continue amantadine back to 100mg  BID  14. Suboptimal vitamin D: continue 1,000U D3 daily  15. Spasticity:   -Remains extremely tight, increase baclofen to 20 mg 3 times daily. Appears to have increased sedation with this increase, continue to monitor. Continue wrist, elbow brace, and PRAFOs  -7/27 alert and awake this AM, continue current dose of baclofen  16. Bowel and bladder incontinence: continue bowel and bladder program   17. Left hand skin maceration: Eucerin ordered, discussed hand split with OT  18. Tachycardia: decreased amantadine back to 100mg  BID, continue this dose  -Tachycardia has improved, continue to montior   19. HTN: decrease amantadine to 100mg  BID. BP reviewed and has stabilized, continue this dose  -7/27 BP controlled, continue current regimen    12/09/2022    6:26 AM 12/08/2022    9:20 PM 12/08/2022    1:02 PM  Vitals with BMI  Systolic 135 113 811  Diastolic 65 66 90  Pulse 58 81 87    20. Area of redness between left thumb and index finger: continue foam dressing  21. Scalp eczema: selsun blue ordered, continue  LOS: 21 days A FACE TO FACE EVALUATION WAS PERFORMED  Fanny Dance 12/09/2022, 12:57 PM

## 2022-12-10 NOTE — Progress Notes (Signed)
Physical Therapy Session Note  Patient Details  Name: Nicole [name] MRN: [id number] Date of Birth: [birth year]  Today's Date: 12/10/2022 PT Individual Time: 1000-1045 PT Individual Time Calculation (min): 45 min   Short Term Goals: Week 1:  PT Short Term Goal 1 (Week 1): pt will perform sit<>stand with +2 max A PT Short Term Goal 1 - Progress (Week 1): Not met PT Short Term Goal 2 (Week 1): pt will initiate gait training PT Short Term Goal 2 - Progress (Week 1): Not met PT Short Term Goal 3 (Week 1): Pt will perform bed<>chair transfer with +2 max A PT Short Term Goal 3 - Progress (Week 1): Not met  Skilled Therapeutic Interventions/Progress Updates:  Pt was seen bedside in the am. Pt opened eyes to stimuli. Pt transferred supine to edge of bed edge of bed to supine dependent. Pt tolerated edge of bed about 15 minutes with total A to dependent. No righting or protective reactions noted. Attempted to stand, pt dependent no attempt to assist with standing or increased toned noted in lower extremities. Pt returned to supine and moved up in bed dependently. PROM B LEs, 2 sets x 10 reps each, heel slides, hip abd/add, SAQs. Pt left sitting up in bed with all needs within reach.   Therapy Documentation Precautions:  Precautions Precautions: Fall Precaution Comments: Contact precautions & delayed processing Restrictions Weight Bearing Restrictions: No General:   Pain: No signs of pain throughout treatment.    Therapy/Group: Individual Therapy  Rayford Halsted 12/10/2022, 10:45 AM

## 2022-12-10 NOTE — Progress Notes (Signed)
PROGRESS NOTE   Subjective/Complaints: No acute events overnight noted.  AOZ:HYQMVH to obtain due to cognitive/behavioral  Objective:   No results found. No results for input(s): "WBC", "HGB", "HCT", "PLT" in the last 72 hours.   Recent Labs    12/08/22 8469  CREATININE 0.52      Intake/Output Summary (Last 24 hours) at 12/10/2022 1629 Last data filed at 12/10/2022 1235 Gross per 24 hour  Intake 498 ml  Output --  Net 498 ml      Pressure Injury 10/31/22 Buttocks Left Unstageable - Full thickness tissue loss in which the base of the injury is covered by slough (yellow, tan, gray, green or brown) and/or eschar (tan, brown or black) in the wound bed. (Active)  10/31/22 2000  Location: Buttocks  Location Orientation: Left  Staging: Unstageable - Full thickness tissue loss in which the base of the injury is covered by slough (yellow, tan, gray, green or brown) and/or eschar (tan, brown or black) in the wound bed.  Wound Description (Comments):   Present on Admission: Yes     Pressure Injury 11/18/22 Toe (Comment  which one) Anterior;Right Deep Tissue Pressure Injury - Purple or maroon localized area of discolored intact skin or blood-filled blister due to damage of underlying soft tissue from pressure and/or shear. small are (Active)  11/18/22 1300  Location: Toe (Comment  which one)  Location Orientation: Anterior;Right  Staging: Deep Tissue Pressure Injury - Purple or maroon localized area of discolored intact skin or blood-filled blister due to damage of underlying soft tissue from pressure and/or shear.  Wound Description (Comments): small area on right great toe metatarsal  Present on Admission: Yes    Physical Exam: Vital Signs Blood pressure 124/79, pulse 76, temperature 98.6 F (37 C), temperature source Oral, resp. rate 18, height 5\' 1"  (1.549 m), weight 47.4 kg, SpO2 96%.  Gen: no distress, lying in bed,  appears comfortable HEENT: oral mucosa pink and moist, NCAT.  Scaling rash on posterior scalp. Cardio: RRR Chest: CTAB, normal effort, normal rate of breathing Abd: soft, NT, non-distended, positive bowel sounds Ext: no edema Psych: Flat appearing affect Skin: Macerated left hand - healing + Scaling dried skin on head - stable, stage 2 to right toe, area of redness between left thumb and index finger  Neurologic: Does not respond to orientation questions, does not track, does not follow cues. Tone: MAS 3 right elbow, right knee, and right hip; MAS 3 L elbow otherwise resists ROM Cerebellar exam unable to assess  Musculoskeletal: reduced bilateral shoulder and elbow ROM due to increased tone  Elbow brace in place   Assessment/Plan: 1. Functional deficits which require 3+ hours per day of interdisciplinary therapy in a comprehensive inpatient rehab setting. Physiatrist is providing close team supervision and 24 hour management of active medical problems listed below. Physiatrist and rehab team continue to assess barriers to discharge/monitor patient progress toward functional and medical goals  Care Tool:  Bathing    Body parts bathed by patient: Chest, Face   Body parts bathed by helper: Right arm, Left arm, Chest, Abdomen, Front perineal area, Buttocks, Right upper leg, Left upper leg, Right lower leg, Left lower  leg, Face Body parts n/a: Right arm, Right upper leg, Left arm, Left upper leg, Chest, Abdomen, Right lower leg, Front perineal area, Left lower leg, Buttocks, Face   Bathing assist Assist Level: Dependent - Patient 0%     Upper Body Dressing/Undressing Upper body dressing   What is the patient wearing?: Hospital gown only    Upper body assist Assist Level: Dependent - Patient 0%    Lower Body Dressing/Undressing Lower body dressing      What is the patient wearing?: Incontinence brief     Lower body assist Assist for lower body dressing: Dependent - Patient  0%     Toileting Toileting Toileting Activity did not occur (Clothing management and hygiene only): N/A (no void or bm)  Toileting assist Assist for toileting: Dependent - Patient 0%     Transfers Chair/bed transfer  Transfers assist  Chair/bed transfer activity did not occur: Safety/medical concerns  Chair/bed transfer assist level: Maximal Assistance - Patient 25 - 49%     Locomotion Ambulation   Ambulation assist   Ambulation activity did not occur: Safety/medical concerns          Walk 10 feet activity   Assist  Walk 10 feet activity did not occur: Safety/medical concerns        Walk 50 feet activity   Assist Walk 50 feet with 2 turns activity did not occur: Safety/medical concerns         Walk 150 feet activity   Assist Walk 150 feet activity did not occur: Safety/medical concerns         Walk 10 feet on uneven surface  activity   Assist Walk 10 feet on uneven surfaces activity did not occur: Safety/medical concerns         Wheelchair     Assist Is the patient using a wheelchair?: Yes Type of Wheelchair: Manual (tilt in space)    Wheelchair assist level: Dependent - Patient 0%      Wheelchair 50 feet with 2 turns activity    Assist    Wheelchair 50 feet with 2 turns activity did not occur: Safety/medical concerns   Assist Level: Dependent - Patient 0%   Wheelchair 150 feet activity     Assist  Wheelchair 150 feet activity did not occur: Safety/medical concerns   Assist Level: Dependent - Patient 0%   Blood pressure 124/79, pulse 76, temperature 98.6 F (37 C), temperature source Oral, resp. rate 18, height 5\' 1"  (1.549 m), weight 47.4 kg, SpO2 96%.  Medical Problem List and Plan: 1. 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 Extubated 11/05/2022 Fluctuating level of alertness             -patient may  shower  Elbow splint and PRAFOs ordered              -ELOS/Goals: min/modA but prognosis is guarded due to severe cognitive deficits goals 28-30d  -Continue CIR therapies including PT, OT, and SLP    [facility] bed ordered  Mother updated  Palliative care consulted, discussed home with hospice but patient is not a candidate  MRI brain ordered given decline in speech-->no acute abnormality evident            Observed therapy, discussed disposition with SW, applying for nursing home placement  -discussed with PT and SW applying for nursing homes  -obtaining guardianship discussed with mother, form signed for her  Team conference 7/24  2.  Impaired mobility: continue Lovenox             -  antiplatelet therapy: N/A 3. Pain Management: Tylenol as needed 4. Mood/Behavior/Sleep: Provide emotional support             -antipsychotic agents: N/A  5. Neuropsych/cognition: This patient is not capable of making decisions on her own behalf. 6. Left buttock unstageable ulcer.  Continue Medihoney to buttock wound daily cover with foam dressing.  Changing dressing every 3 days or as needed soiling   - hydrocortisone cream to scalp for rash 7/20  7. Fluids/Electrolytes/Nutrition: Routine in and outs with follow-up chemistries  -Labs 7/21 stable.  -Appears to have good p.o. intake  8.  Cavitary right lower lobe pneumonia likely aspiration pneumonia/MRSA pneumonia.  Continue course of Zyvox as indicated per infectious disease  -Labs 7/21 stable.  9.  History of drug abuse.  Positive cocaine on urine drug screen.  Provide counseling  10.  AKI/hypovolemia and ATN.  Renal function much improved.  Repeat Cr today.  -7/27 Cr stable 0.52 yesterday  -7/28 recheck BMP tomorrow    Latest Ref Rng & Units 12/08/2022    6:21 AM 12/03/2022    7:18 AM 12/01/2022    5:00 AM  BMP  Glucose 70 - 99 mg/dL  92    BUN 6 - 20 mg/dL  19    Creatinine 2.84 - 1.00 mg/dL 1.32  4.40  1.02   Sodium 135 - 145 mmol/L  137    Potassium 3.5 - 5.1 mmol/L  3.7    Chloride 98 -  111 mmol/L  103    CO2 22 - 32 mmol/L  24    Calcium 8.9 - 10.3 mg/dL  8.9      11.  Mild transaminitis with rhabdomyolysis.  CK reviewed and normalized  12.  Hypotension. Resolved, d/c midodrine  13. Impaired initiation: continue amantadine back to 100mg  BID  14. Suboptimal vitamin D: continue 1,000U D3 daily  15. Spasticity:   -Remains extremely tight, increase baclofen to 20 mg 3 times daily. Appears to have increased sedation with this increase, continue to monitor. Continue wrist, elbow brace, and PRAFOs  -7/27 alert and awake this AM, continue current dose of baclofen  16. Bowel and bladder incontinence: continue bowel and bladder program   17. Left hand skin maceration: Eucerin ordered, discussed hand split with OT  18. Tachycardia: decreased amantadine back to 100mg  BID, continue this dose  -Heart rate stable in the 70s  19. HTN: decrease amantadine to 100mg  BID. BP reviewed and has stabilized, continue this dose  -7/27-8 BP controlled, continue current regimen    12/10/2022   12:38 PM 12/10/2022    5:50 AM 12/09/2022    7:51 PM  Vitals with BMI  Systolic 124 136 725  Diastolic 79 77 81  Pulse 76 72 87    20. Area of redness between left thumb and index finger: continue foam dressing  21. Scalp eczema: selsun blue ordered, continue  LOS: 22 days A FACE TO FACE EVALUATION WAS PERFORMED  Fanny Dance 12/10/2022, 4:29 PM

## 2022-12-11 NOTE — Progress Notes (Signed)
Occupational Therapy Session Note  Patient Details  Name: Nicole [name] MRN: [id number] Date of Birth: February 03, 1976  Today's Date: 12/11/2022 OT Individual Time: 4098-1191 OT Individual Time Calculation (min): 60 min    Short Term Goals: Week 3:  OT Short Term Goal 1 (Week 3): pt will tolerate tilt table to at least 60* for 3 consecutive sessions as precursor to higher level functional mobility tasks OT Short Term Goal 2 (Week 3): pt will respond to verbal stimuli 3/5 trials to improve attention to task OT Short Term Goal 3 (Week 3): pt will maintain static sitting balance EOB with CGA for at least 10 secs as precursor to higher level ADl tasks  Skilled Therapeutic Interventions/Progress Updates:   Patient received supine in bed.  Patient with strong head turn toward right.  Patient grimacing to gentle neck/head range toward left.  Ted hose applied in supine.  Assisted patient to wash face, chest and arms.  Hand over hand guiding.  Patient with limited ability to participate this session. Patient assisted to roll and sit at edge of bed to prepare for transfer to wheelchair.   In wheelchair taken to sink to complete grooming.  Again hand over hand assist to aide with oral care.  Patient holds toothbrush in right hand, opens mouth as toothbrush approaches mouth, but limited back and forth oscillation of toothbrush without hand over hand assist.   Patient assisted to dress with total assist.    Left up in tilt in space chair in reclined position with safety belt in place and engaged.  Mom at bedside preparing to feed patient lunch.    Positioning:   Neck roll - to reduce neck flexion Elbow brace RUE to promote increased extension (reduce excessive flexion) Hands:  sheepsking cuff left hand, mitt R hand.   Lap tray with pillows to promote shoulder flexion, reduce full IR of shoulders.   Pillow roll to reduce lateral flexion R side.     Therapy Documentation Precautions:   Precautions Precautions: Fall Precaution Comments: Contact precautions & delayed processing Restrictions Weight Bearing Restrictions: No   Pain: Pain Assessment Pain Scale: Faces Faces Pain Scale: No hurt     Therapy/Group: Individual Therapy  [name] 12/11/2022, 1:13 PM

## 2022-12-11 NOTE — Plan of Care (Signed)
  Problem: RH SAFETY Goal: RH STG ADHERE TO SAFETY PRECAUTIONS W/ASSISTANCE/DEVICE Description: STG Adhere to Safety Precautions With cues Assistance/Device. 12/11/2022 0756 by Cristal Ford, RN Outcome: Progressing 12/11/2022 0755 by Cristal Ford, RN Outcome: Progressing   Problem: Consults Goal: RH STROKE PATIENT EDUCATION Description: See Patient Education module for education specifics  12/11/2022 0756 by Cristal Ford, RN Outcome: Progressing 12/11/2022 0755 by Cristal Ford, RN Outcome: Not Progressing

## 2022-12-11 NOTE — Progress Notes (Signed)
PROGRESS NOTE   Subjective/Complaints: Patient's chart reviewed- No issues reported overnight Vitals signs stable  Labs stable today   ROS: Unable to obtain due to cognitive/behavioral  Objective:   No results found. Recent Labs    12/11/22 0715  WBC 9.0  HGB 14.9  HCT 45.1  PLT 329     Recent Labs    12/11/22 0715  NA 138  K 3.5  CL 102  CO2 24  GLUCOSE 107*  BUN 13  CREATININE 0.64  CALCIUM 9.2      Intake/Output Summary (Last 24 hours) at 12/11/2022 1115 Last data filed at 12/11/2022 4098 Gross per 24 hour  Intake 716 ml  Output --  Net 716 ml      Pressure Injury 10/31/22 Buttocks Left Unstageable - Full thickness tissue loss in which the base of the injury is covered by slough (yellow, tan, gray, green or brown) and/or eschar (tan, brown or black) in the wound bed. (Active)  10/31/22 2000  Location: Buttocks  Location Orientation: Left  Staging: Unstageable - Full thickness tissue loss in which the base of the injury is covered by slough (yellow, tan, gray, green or brown) and/or eschar (tan, brown or black) in the wound bed.  Wound Description (Comments):   Present on Admission: Yes     Pressure Injury 11/18/22 Toe (Comment  which one) Anterior;Right Deep Tissue Pressure Injury - Purple or maroon localized area of discolored intact skin or blood-filled blister due to damage of underlying soft tissue from pressure and/or shear. small are (Active)  11/18/22 1300  Location: Toe (Comment  which one)  Location Orientation: Anterior;Right  Staging: Deep Tissue Pressure Injury - Purple or maroon localized area of discolored intact skin or blood-filled blister due to damage of underlying soft tissue from pressure and/or shear.  Wound Description (Comments): small area on right great toe metatarsal  Present on Admission: Yes    Physical Exam: Vital Signs Blood pressure 124/79, pulse 76, temperature  98.6 F (37 C), temperature source Oral, resp. rate 18, height 5\' 1"  (1.549 m), weight 47.4 kg, SpO2 96%.  Gen: no distress, lying in bed, appears comfortable HEENT: oral mucosa pink and moist, NCAT.  Scaling rash on posterior scalp. Cardio: RRR Chest: CTAB, normal effort, normal rate of breathing Abd: soft, NT, non-distended, positive bowel sounds Ext: no edema Psych: Flat appearing affect Skin: Macerated left hand - healing + Scaling dried skin on head - stable, stage 2 to right toe, area of redness between left thumb and index finger  Neurologic: Does not respond to orientation questions, does not track, does not follow cues. Opens eyes to stimuli Tone: MAS 3 right elbow, right knee, and right hip; MAS 3 L elbow otherwise resists ROM Cerebellar exam unable to assess  Musculoskeletal: reduced bilateral shoulder and elbow ROM due to increased tone  Elbow brace in place   Assessment/Plan: 1. Functional deficits which require 3+ hours per day of interdisciplinary therapy in a comprehensive inpatient rehab setting. Physiatrist is providing close team supervision and 24 hour management of active medical problems listed below. Physiatrist and rehab team continue to assess barriers to discharge/monitor patient progress toward functional and medical  goals  Care Tool:  Bathing    Body parts bathed by patient: Chest, Face   Body parts bathed by helper: Right arm, Left arm, Chest, Abdomen, Front perineal area, Buttocks, Right upper leg, Left upper leg, Right lower leg, Left lower leg, Face Body parts n/a: Right arm, Right upper leg, Left arm, Left upper leg, Chest, Abdomen, Right lower leg, Front perineal area, Left lower leg, Buttocks, Face   Bathing assist Assist Level: Dependent - Patient 0%     Upper Body Dressing/Undressing Upper body dressing   What is the patient wearing?: Hospital gown only    Upper body assist Assist Level: Dependent - Patient 0%    Lower Body  Dressing/Undressing Lower body dressing      What is the patient wearing?: Incontinence brief     Lower body assist Assist for lower body dressing: Dependent - Patient 0%     Toileting Toileting Toileting Activity did not occur (Clothing management and hygiene only): N/A (no void or bm)  Toileting assist Assist for toileting: Dependent - Patient 0%     Transfers Chair/bed transfer  Transfers assist  Chair/bed transfer activity did not occur: Safety/medical concerns  Chair/bed transfer assist level: Maximal Assistance - Patient 25 - 49%     Locomotion Ambulation   Ambulation assist   Ambulation activity did not occur: Safety/medical concerns          Walk 10 feet activity   Assist  Walk 10 feet activity did not occur: Safety/medical concerns        Walk 50 feet activity   Assist Walk 50 feet with 2 turns activity did not occur: Safety/medical concerns         Walk 150 feet activity   Assist Walk 150 feet activity did not occur: Safety/medical concerns         Walk 10 feet on uneven surface  activity   Assist Walk 10 feet on uneven surfaces activity did not occur: Safety/medical concerns         Wheelchair     Assist Is the patient using a wheelchair?: Yes Type of Wheelchair: Manual (tilt in space)    Wheelchair assist level: Dependent - Patient 0%      Wheelchair 50 feet with 2 turns activity    Assist    Wheelchair 50 feet with 2 turns activity did not occur: Safety/medical concerns   Assist Level: Dependent - Patient 0%   Wheelchair 150 feet activity     Assist  Wheelchair 150 feet activity did not occur: Safety/medical concerns   Assist Level: Dependent - Patient 0%   Blood pressure 124/79, pulse 76, temperature 98.6 F (37 C), temperature source Oral, resp. rate 18, height 5\' 1"  (1.549 m), weight 47.4 kg, SpO2 96%.  Medical Problem List and Plan: 1. 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 Extubated 11/05/2022 Fluctuating level of alertness             -patient may  shower  Elbow splint and PRAFOs ordered             -ELOS/Goals: min/modA but prognosis is guarded due to severe cognitive deficits goals 28-30d  -Continue CIR therapies including PT, OT, and SLP    [facility] bed ordered  Mother updated  Palliative care consulted, discussed home with hospice but patient is not a candidate  MRI brain ordered given decline in speech-->no acute abnormality evident  Observed therapy, discussed disposition with SW, applying for nursing home placement  -discussed with PT and SW applying for nursing homes  -obtaining guardianship discussed with mother, form signed for her  Team conference 7/24  2.  Impaired mobility: continue Lovenox             -antiplatelet therapy: N/A 3. Pain Management: Tylenol as needed 4. Mood/Behavior/Sleep: Provide emotional support             -antipsychotic agents: N/A  5. Neuropsych/cognition: This patient is not capable of making decisions on her own behalf. 6. Left buttock unstageable ulcer.  Continue Medihoney to buttock wound daily cover with foam dressing.  Changing dressing every 3 days or as needed soiling   - hydrocortisone cream to scalp for rash 7/20  7. Fluids/Electrolytes/Nutrition: Routine in and outs with follow-up chemistries  -Labs 7/21 stable.  -Appears to have good p.o. intake  8.  Cavitary right lower lobe pneumonia likely aspiration pneumonia/MRSA pneumonia.  Continue course of Zyvox as indicated per infectious disease  -Labs 7/21 stable.  9.  History of drug abuse.  Positive cocaine on urine drug screen.  Provide counseling  10.  AKI/hypovolemia and ATN.  Renal function much improved.  Repeat Cr today.  -7/27 Cr stable 0.52 yesterday  -7/28 recheck BMP tomorrow    Latest Ref Rng & Units 12/11/2022    7:15 AM 12/08/2022    6:21 AM 12/03/2022    7:18 AM  BMP  Glucose 70  - 99 mg/dL 098   92   BUN 6 - 20 mg/dL 13   19   Creatinine 1.19 - 1.00 mg/dL 1.47  8.29  5.62   Sodium 135 - 145 mmol/L 138   137   Potassium 3.5 - 5.1 mmol/L 3.5   3.7   Chloride 98 - 111 mmol/L 102   103   CO2 22 - 32 mmol/L 24   24   Calcium 8.9 - 10.3 mg/dL 9.2   8.9     11.  Mild transaminitis with rhabdomyolysis.  CK reviewed and normalized  12.  Hypotension. Resolved, d/c midodrine  13. Impaired initiation: continue amantadine back to 100mg  BID  14. Suboptimal vitamin D: continue 1,000U D3 daily  15. Spasticity:   -Remains extremely tight, increase baclofen to 20 mg 3 times daily. Appears to have increased sedation with this increase, continue to monitor. Continue wrist, elbow brace, and PRAFOs  -7/27 alert and awake this AM, continue current dose of baclofen  16. Bowel and bladder incontinence: continue bowel and bladder program   17. Left hand skin maceration: Eucerin ordered, discussed hand split with OT  18. Tachycardia: decreased amantadine back to 100mg  BID, continue this dose  19. HTN: decrease amantadine to 100mg  BID. BP reviewed and has stabilized, continue this dose     12/10/2022   12:38 PM 12/10/2022    5:50 AM 12/09/2022    7:51 PM  Vitals with BMI  Systolic 124 136 130  Diastolic 79 77 81  Pulse 76 72 87    20. Area of redness between left thumb and index finger: continue foam dressing  21. Scalp eczema: selsun blue ordered, continue  LOS: 23 days A FACE TO FACE EVALUATION WAS PERFORMED  Vena Bassinger P Tarick Parenteau 12/11/2022, 11:15 AM

## 2022-12-11 NOTE — Plan of Care (Signed)
  Problem: RH SAFETY Goal: RH STG ADHERE TO SAFETY PRECAUTIONS W/ASSISTANCE/DEVICE Description: STG Adhere to Safety Precautions With cues Assistance/Device. Outcome: Progressing   Problem: Consults Goal: RH STROKE PATIENT EDUCATION Description: See Patient Education module for education specifics  Outcome: Not Progressing   Problem: RH SKIN INTEGRITY Goal: RH STG SKIN FREE OF INFECTION/BREAKDOWN Description: Manage skin with min assist  Outcome: Not Progressing

## 2022-12-11 NOTE — Progress Notes (Signed)
Speech Language Pathology Weekly Progress and Session Note  Patient Details  Name: KAMILLA MUIRHEAD MRN: [id number] Date of Birth: 08-Feb-1976  Beginning of progress report period: December 04, 2022 End of progress report period: December 11, 2022  Today's Date: 12/11/2022 SLP Individual Time: 1445-1535 SLP Individual Time Calculation (min): 50 min  Short Term Goals: Week 3: SLP Short Term Goal 1 (Week 3): Patient will utilize multimodal communication to answer basic yes/no questions with Max A multimodal cues in 10% of opportunities SLP Short Term Goal 1 - Progress (Week 3): Not met SLP Short Term Goal 2 (Week 3): Patient will follow 1-step commands within a context in 10% of opportunities with Max A multimodal cues. SLP Short Term Goal 2 - Progress (Week 3): Not met SLP Short Term Goal 3 (Week 3): Pt will present w/ appropriate response to functional stimuli (i.e. accept spoon, grasp basic item appropriately, response to tactile stimuli) 10% of the time w/ maxA multimodal cues SLP Short Term Goal 3 - Progress (Week 3): Met    New Short Term Goals: Week 4: SLP Short Term Goal 1 (Week 4): Pt will present w/ appropriate response to functional stimuli (i.e. accept spoon, grasp basic item appropriately, response to tactile stimuli) 15% of the time w/ maxA multimodal cues SLP Short Term Goal 2 (Week 4): Patient will follow 1-step commands within a context in 10% of opportunities with Max A multimodal cues. SLP Short Term Goal 3 (Week 4): Patient will utilize multimodal communication to answer basic yes/no questions with Max A multimodal cues in 10% of opportunities  Weekly Progress Updates: Pt has made some gains and has met 1 of 3 STG's this reporting period. She is tolerating a Dys 3 diet with full supervision mostly due to feeding impairments and cognition. Pt presents with limited initiation of communication; verbal and nonverbal. She met x1 goal for functional use of an item in 10% of opportunites with  max A. She also presents with decreased ability to follow commands. Pt would benefit from continued skilled SLP intervention to maximize cognition and language in order to maximize her functional independence prior to discharge.   Intensity: Minumum of 1-2 x/day, 30 to 90 minutes Frequency: 1 to 3 out of 7 days Duration/Length of Stay: Awaiting SNF Treatment/Interventions: Cognitive remediation/compensation;Dysphagia/aspiration precaution training;Internal/external aids;Speech/Language facilitation;Cueing hierarchy;Environmental controls;Therapeutic Activities;Functional tasks;Multimodal communication approach;Patient/family education;Therapeutic Exercise;DME/adaptive equipment instruction   Daily Session  Skilled Therapeutic Interventions: Pt was seen in PM to address cognitive re- training. Pt appeared fatigued upon SLP arrival with mother present at bedside. [name] left shortly after however, reported pt appeared to be more tired this date. [name] eyes opened in response to this SLP's voice however no initiation to turn to left in search of sound. SLP repositioned pt upright and began playing preferred music genre. SLP challenging pt in utilizing multimodal communication. Given visual stimuli. SLP facilitated pt in identifying biographical information. Pt required total A hand over hand for identifying information. When alertness fluctuated, SLP moved to left side of bed with pt turning to midline in one opportunity to search for voice. In additional minutes, SLP addressed use of functional objects. Given a pen and paper placed in front of her, SLP guided pt in tracing her name with total A. SLP further trialed use of brush however no continued motion observed after hand over hand. Pt was left at bedside with call button within reach. SLP to continue POC.    General    Pain Pain Assessment Pain  Scale: Faces Pain Score: 0-No pain  Therapy/Group: Individual Therapy  [name] 12/11/2022, 4:47 PM

## 2022-12-11 NOTE — Progress Notes (Signed)
Physical Therapy Session Note  Patient Details  Name: Nicole [name] MRN: [id number] Date of Birth: [birth year]  Today's Date: 12/11/2022 PT Individual Time: 0933-1002 PT Individual Time Calculation (min): 29 min   Short Term Goals: Week 3:  PT Short Term Goal 1 (Week 3): Pt will initiate bed mobility but require no more than totalA PT Short Term Goal 2 (Week 3): Pt will maintain sitting balance unsupported for 30 seconds PT Short Term Goal 3 (Week 3): Pt will demonstrate command follow for ~15% of tasks.  Skilled Therapeutic Interventions/Progress Updates:    Pt presents in room in bed, responds spontaneously to stimuli, decreased alertness. Pt unable to report pain at this time however does demonstrate increased facial grimacing with repositioning and stretching of flexed musculature. Session focused on repositioning and decreasing flexor tone as well as trial tilt function to improve tolerance to upright and standing position.  Pt demonstrating L sidelying and hyperflexed posture of BUEs, BLEs, trunk, and R lateral cervical flexion and R cervical rotation. Pt demonstrating poor positioning in PRAFOS. PRAFOS doffed with total assist, increased time required for stretching BLEs to reposition pt in supine with neutral knee, hip, and trunk. Kreg bed pads donned to pt shin and hips and pt tilted to 15*. Bed unable to elevated past 15*, pt remains in this position with therapist placing step at foot of bed to increase BLE weightbearing and promote neutral DF. Therapist providing verbal cueing and description throughout, attempting to promote reaction to localized stimuli with tactile and verbal cueing. Pt does not respond to verbal cues, responds minimally to tactile cues provided to pt L trapezius and SCM musculature. Therapist attempts stretching to R elbow to decrease flexor tone, unable to achieve extension past 90* of elbow flexion. Pt remains in tilted position for duration of session ~15 min, then  returned to 0* tilt and straps removed. Therapist reapplies bilateral PRAFOS to promote improved neutral ankle position, and pillow placed between knees to decrease excessive adductor tone. Pillow placed beneath L hip to promote gentle offloading of sacrum to prevent skin breakdown.  Pt demonstrates improved positioning following session with neutral trunk/hip/knee. Pt remains in supine in bed at end of session with all needs within reach.  Therapy Documentation Precautions:  Precautions Precautions: Fall Precaution Comments: Contact precautions & delayed processing Restrictions Weight Bearing Restrictions: No    Therapy/Group: Individual Therapy  Edwin Cap PT, DPT 12/11/2022, 10:07 AM

## 2022-12-11 NOTE — Progress Notes (Signed)
Patient ID: Nicole [name], female   DOB: [birth year], 47 y.o.   MRN: [id number] Met with Mom to discuss may need to change pt's medicaid to one geared more toward her physical issues. Mom has spoken with peggy baldwin-SW at [facility]. Have called her and left a message. Will await return call. Attempted to contact [place] [employer] and was hung up on twice after waited 20 minutes on hold. Continue to work on discharge plan.

## 2022-12-12 MED ORDER — TIZANIDINE HCL 4 MG PO TABS
2.0000 mg | ORAL_TABLET | Freq: Every day | ORAL | Status: DC
  Administered 2022-12-12: 2 mg via ORAL
  Filled 2022-12-12: qty 1

## 2022-12-12 MED ORDER — BACLOFEN 5 MG HALF TABLET
15.0000 mg | ORAL_TABLET | Freq: Three times a day (TID) | ORAL | Status: DC
  Administered 2022-12-12 – 2022-12-16 (×13): 15 mg via ORAL
  Filled 2022-12-12 (×13): qty 1

## 2022-12-12 NOTE — Progress Notes (Signed)
Physical Therapy Session Note  Patient Details  Name: Nicole [name] MRN: [id number] Date of Birth: 11/23/1975  {CHL IP REHAB PT TIME CALCULATION:304800500}  Short Term Goals: {NWG:9562130}  Skilled Therapeutic Interventions/Progress Updates:      Therapy Documentation Precautions:  Precautions Precautions: Fall Precaution Comments: Contact precautions & delayed processing Restrictions Weight Bearing Restrictions: No General:   Vital Signs: Therapy Vitals Temp: 97.8 F (36.6 C) Temp Source: Oral Pulse Rate: 82 Resp: 17 BP: (!) 150/94 Patient Position (if appropriate): Sitting Oxygen Therapy SpO2: 100 % O2 Device: Room Air Pain:   Mobility:   Locomotion :    Trunk/Postural Assessment :    Balance:   Exercises:   Other Treatments:      Therapy/Group: {Therapy/Group:3049007}  Loel Dubonnet 12/12/2022, 5:06 PM

## 2022-12-12 NOTE — Progress Notes (Signed)
Occupational Therapy Session Note  Patient Details  Name: Nicole [name] MRN: [id number] Date of Birth: 07/26/1975  Today's Date: 12/12/2022 OT Individual Time: 1345-1415 OT Individual Time Calculation (min): 30 min    Short Term Goals: Week 3:  OT Short Term Goal 1 (Week 3): pt will tolerate tilt table to at least 60* for 3 consecutive sessions as precursor to higher level functional mobility tasks OT Short Term Goal 2 (Week 3): pt will respond to verbal stimuli 3/5 trials to improve attention to task OT Short Term Goal 3 (Week 3): pt will maintain static sitting balance EOB with CGA for at least 10 secs as precursor to higher level ADl tasks   Skilled Therapeutic Interventions/Progress Updates:    Pt received sitting up in the w/c with forward head flexed posture, eyes closed, but easily awoken with vc. No indications of pain. Her brief was checked for incontinence and she was dry. She was taken via w/c to the therapy gym. Worked in the standing frame to address BLE weightbearing in standing as well as BUE weightbearing through elbows on the tray- all to improve bone density, arousal and attention in standing, and trunk control. She required total A +2 for several prolonged stands in the standing frame, lasting about 2 minutes. She was also provided intermittent heavy facilitation into neck extension/neutral. Pt grimacing during head manipulation initially but able to relax into posture. Pt passed off to PT in therapy gym.   Therapy Documentation Precautions:  Precautions Precautions: Fall Precaution Comments: Contact precautions & delayed processing Restrictions Weight Bearing Restrictions: No   Therapy/Group: Individual Therapy  Crissie Reese 12/12/2022, 3:05 PM

## 2022-12-12 NOTE — Progress Notes (Signed)
Physical Therapy Session Note  Patient Details  Name: KAYSHLA CAWTHORN MRN: [id number] Date of Birth: [birth year]  {CHL IP REHAB PT TIME CALCULATION:304800500}  Short Term Goals: {WUJ:8119147}  Skilled Therapeutic Interventions/Progress Updates:      Therapy Documentation Precautions:  Precautions Precautions: Fall Precaution Comments: Contact precautions & delayed processing Restrictions Weight Bearing Restrictions: No General:   Vital Signs: Therapy Vitals Temp: 97.8 F (36.6 C) Temp Source: Oral Pulse Rate: 82 Resp: 17 BP: (!) 150/94 Patient Position (if appropriate): Sitting Oxygen Therapy SpO2: 100 % O2 Device: Room Air Pain:   Mobility:   Locomotion :    Trunk/Postural Assessment :    Balance:   Exercises:   Other Treatments:      Therapy/Group: {Therapy/Group:3049007}  Loel Dubonnet 12/12/2022, 5:07 PM

## 2022-12-12 NOTE — Progress Notes (Signed)
PROGRESS NOTE   Subjective/Complaints: Patient's chart reviewed- No issues reported overnight Diastolic BP is elevated- will add tizanidine 2mg  HS   ROS: Unable to obtain due to cognitive/behavioral  Objective:   No results found. Recent Labs    12/11/22 0715  WBC 9.0  HGB 14.9  HCT 45.1  PLT 329     Recent Labs    12/11/22 0715  NA 138  K 3.5  CL 102  CO2 24  GLUCOSE 107*  BUN 13  CREATININE 0.64  CALCIUM 9.2      Intake/Output Summary (Last 24 hours) at 12/12/2022 1106 Last data filed at 12/12/2022 0757 Gross per 24 hour  Intake 478 ml  Output --  Net 478 ml      Pressure Injury 10/31/22 Buttocks Left Unstageable - Full thickness tissue loss in which the base of the injury is covered by slough (yellow, tan, gray, green or brown) and/or eschar (tan, brown or black) in the wound bed. (Active)  10/31/22 2000  Location: Buttocks  Location Orientation: Left  Staging: Unstageable - Full thickness tissue loss in which the base of the injury is covered by slough (yellow, tan, gray, green or brown) and/or eschar (tan, brown or black) in the wound bed.  Wound Description (Comments):   Present on Admission: Yes     Pressure Injury 11/18/22 Toe (Comment  which one) Anterior;Right Deep Tissue Pressure Injury - Purple or maroon localized area of discolored intact skin or blood-filled blister due to damage of underlying soft tissue from pressure and/or shear. small are (Active)  11/18/22 1300  Location: Toe (Comment  which one)  Location Orientation: Anterior;Right  Staging: Deep Tissue Pressure Injury - Purple or maroon localized area of discolored intact skin or blood-filled blister due to damage of underlying soft tissue from pressure and/or shear.  Wound Description (Comments): small area on right great toe metatarsal  Present on Admission: Yes    Physical Exam: Vital Signs Blood pressure (!) 137/95, pulse  66, temperature 98.2 F (36.8 C), temperature source Oral, resp. rate 16, height 5\' 1"  (1.549 m), weight 47.4 kg, SpO2 100%.  Gen: no distress, lying in bed, appears comfortable, fatigued HEENT: oral mucosa pink and moist, NCAT.  Scaling rash on posterior scalp. Cardio: RRR Chest: CTAB, normal effort, normal rate of breathing Abd: soft, NT, non-distended, positive bowel sounds Ext: no edema Psych: Flat appearing affect Skin: Macerated left hand - healing + Scaling dried skin on head - stable, stage 2 to right toe, area of redness between left thumb and index finger  Neurologic: Does not respond to orientation questions, does not track, does not follow cues. Opens eyes to stimuli Tone: MAS 3 right elbow, right knee, and right hip; MAS 3 L elbow otherwise resists ROM Cerebellar exam unable to assess  Musculoskeletal: reduced bilateral shoulder and elbow ROM due to increased tone  Elbow brace in place   Assessment/Plan: 1. Functional deficits which require 3+ hours per day of interdisciplinary therapy in a comprehensive inpatient rehab setting. Physiatrist is providing close team supervision and 24 hour management of active medical problems listed below. Physiatrist and rehab team continue to assess barriers to discharge/monitor patient progress  toward functional and medical goals  Care Tool:  Bathing    Body parts bathed by patient: Chest, Face   Body parts bathed by helper: Right arm, Left arm, Chest, Abdomen, Front perineal area, Buttocks, Right upper leg, Left upper leg, Right lower leg, Left lower leg, Face Body parts n/a: Right arm, Right upper leg, Left arm, Left upper leg, Chest, Abdomen, Right lower leg, Front perineal area, Left lower leg, Buttocks, Face   Bathing assist Assist Level: Dependent - Patient 0%     Upper Body Dressing/Undressing Upper body dressing   What is the patient wearing?: Hospital gown only    Upper body assist Assist Level: Dependent - Patient 0%     Lower Body Dressing/Undressing Lower body dressing      What is the patient wearing?: Incontinence brief     Lower body assist Assist for lower body dressing: Dependent - Patient 0%     Toileting Toileting Toileting Activity did not occur (Clothing management and hygiene only): N/A (no void or bm)  Toileting assist Assist for toileting: Dependent - Patient 0%     Transfers Chair/bed transfer  Transfers assist  Chair/bed transfer activity did not occur: Safety/medical concerns  Chair/bed transfer assist level: Maximal Assistance - Patient 25 - 49%     Locomotion Ambulation   Ambulation assist   Ambulation activity did not occur: Safety/medical concerns          Walk 10 feet activity   Assist  Walk 10 feet activity did not occur: Safety/medical concerns        Walk 50 feet activity   Assist Walk 50 feet with 2 turns activity did not occur: Safety/medical concerns         Walk 150 feet activity   Assist Walk 150 feet activity did not occur: Safety/medical concerns         Walk 10 feet on uneven surface  activity   Assist Walk 10 feet on uneven surfaces activity did not occur: Safety/medical concerns         Wheelchair     Assist Is the patient using a wheelchair?: Yes Type of Wheelchair: Manual (tilt in space)    Wheelchair assist level: Dependent - Patient 0%      Wheelchair 50 feet with 2 turns activity    Assist    Wheelchair 50 feet with 2 turns activity did not occur: Safety/medical concerns   Assist Level: Dependent - Patient 0%   Wheelchair 150 feet activity     Assist  Wheelchair 150 feet activity did not occur: Safety/medical concerns   Assist Level: Dependent - Patient 0%   Blood pressure (!) 137/95, pulse 66, temperature 98.2 F (36.8 C), temperature source Oral, resp. rate 16, height 5\' 1"  (1.549 m), weight 47.4 kg, SpO2 100%.  Medical Problem List and Plan: 1. 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 Extubated 11/05/2022 Fluctuating level of alertness             -patient may  shower  Elbow splint and PRAFOs ordered             -ELOS/Goals: min/modA but prognosis is guarded due to severe cognitive deficits goals 28-30d  -Continue CIR therapies including PT, OT, and SLP    Craig bed ordered  Mother updated  Palliative care consulted, discussed home with hospice but patient is not a candidate  MRI brain ordered given decline in speech-->no acute abnormality evident  Observed therapy, discussed disposition with SW, applying for nursing home placement  -discussed with PT and SW applying for nursing homes  -obtaining guardianship discussed with mother, form signed for her  Team conference 7/24  2.  Impaired mobility: continue Lovenox             -antiplatelet therapy: N/A 3. Pain Management: Tylenol as needed 4. Mood/Behavior/Sleep: Provide emotional support             -antipsychotic agents: N/A  5. Neuropsych/cognition: This patient is not capable of making decisions on her own behalf. 6. Left buttock unstageable ulcer.  Continue Medihoney to buttock wound daily cover with foam dressing.  Changing dressing every 3 days or as needed soiling   - hydrocortisone cream to scalp for rash 7/20  7. Fluids/Electrolytes/Nutrition: Routine in and outs with follow-up chemistries  -Labs 7/21 stable.  -Appears to have good p.o. intake  8.  Cavitary right lower lobe pneumonia likely aspiration pneumonia/MRSA pneumonia.  Continue course of Zyvox as indicated per infectious disease  -Labs 7/21 stable.  9.  History of drug abuse.  Positive cocaine on urine drug screen.  Provide counseling  10.  AKI/hypovolemia and ATN.  Renal function much improved.  Repeat Cr today.  -7/27 Cr stable 0.52 yesterday  -7/28 recheck BMP tomorrow    Latest Ref Rng & Units 12/11/2022    7:15 AM 12/08/2022    6:21 AM 12/03/2022     7:18 AM  BMP  Glucose 70 - 99 mg/dL 782   92   BUN 6 - 20 mg/dL 13   19   Creatinine 9.56 - 1.00 mg/dL 2.13  0.86  5.78   Sodium 135 - 145 mmol/L 138   137   Potassium 3.5 - 5.1 mmol/L 3.5   3.7   Chloride 98 - 111 mmol/L 102   103   CO2 22 - 32 mmol/L 24   24   Calcium 8.9 - 10.3 mg/dL 9.2   8.9     11.  Mild transaminitis with rhabdomyolysis.  CK reviewed and normalized  12.  Hypotension. Resolved, d/c midodrine  13. Impaired initiation: continue amantadine back to 100mg  BID  14. Suboptimal vitamin D: continue 1,000U D3 daily  15. Spasticity:   -Remains extremely tight, decrease baclofen to 15mg  TID due to fatigue. Appears to have increased sedation with this increase, continue to monitor. Continue wrist, elbow brace, and PRAFOs. Add tizanidine 2mg  HS  16. Bowel and bladder incontinence: continue bowel and bladder program   17. Left hand skin maceration: Eucerin ordered, discussed hand split with OT  18. Tachycardia: decreased amantadine back to 100mg  BID, continue this dose  19. HTN: decrease amantadine to 100mg  BID. BP reviewed and has stabilized, continue this dose. Add tizanidine 2mg  HS     12/12/2022    4:31 AM 12/11/2022    8:00 PM 12/11/2022    4:07 PM  Vitals with BMI  Systolic 137 139 469  Diastolic 95 91 95  Pulse 66 95 88    20. Area of redness between left thumb and index finger: continue foam dressing  21. Scalp eczema: selsun blue ordered, continue  22. Fatigue: decrease baclofen to 15mg  TID  LOS: 24 days A FACE TO FACE EVALUATION WAS PERFORMED  Chelli Yerkes P Jnae Thomaston 12/12/2022, 11:06 AM

## 2022-12-12 NOTE — Progress Notes (Signed)
Patient ID: Nicole [name], female   DOB: 06-04-1975, 47 y.o.   MRN: [id number]  Have spoken with Mom who has spoken with Gigi Gin baldwin-Liaison at [facility] and they will work with Mom and worker to try to get her there. Will need to work on a payment source which will need to go there. Mom has applied for SSD/SSI and will need to apply for long term care Medicaid. Will work together on this plan.

## 2022-12-12 NOTE — Progress Notes (Signed)
Physical Therapy Session Note  Patient Details  Name: Nicole [name] MRN: [id number] Date of Birth: 1975/09/03  Today's Date: 12/12/2022 PT Individual Time: 8469-6295 PT Individual Time Calculation (min): 73 min   Short Term Goals: Week 2:  PT Short Term Goal 1 (Week 2): Pt will initiate bed mobility but require no more than totalA PT Short Term Goal 1 - Progress (Week 2): Not met PT Short Term Goal 2 (Week 2): Pt will maintain sitting balance unsupported for 30 seconds PT Short Term Goal 2 - Progress (Week 2): Not met PT Short Term Goal 3 (Week 2): Pt will demonstrate command follow for ~15% of tasks PT Short Term Goal 3 - Progress (Week 2): Not met PT Short Term Goal 4 (Week 2): Pt will be assessed for custom wheelchair needs PT Short Term Goal 4 - Progress (Week 2): Not met Week 3:  PT Short Term Goal 1 (Week 3): Pt will initiate bed mobility but require no more than totalA PT Short Term Goal 2 (Week 3): Pt will maintain sitting balance unsupported for 30 seconds PT Short Term Goal 3 (Week 3): Pt will demonstrate command follow for ~15% of tasks.  Skilled Therapeutic Interventions/Progress Updates:  Patient seated upright in w/c with pillow prop to RUE and full lap tray placed on armrests on entrance to room. Mother present. Patient awake/ alert with reduced awareness.   Patient with no pain complaint at start of session. Is seated in very flexed position despite back tilt of TIS w/c. Heavy/ strong neck flexion.   Therapeutic Activity/NMR: Pt brought to day room and transferred to mat table with supported squat pivot TotA +1 with +2 for safety. Pt seated on EOM and supported posteriorly with t-ball and pec stretch initiated with pressure of shoulders toward ball. Step under feet for improved DF positioning and calf stretch. This improves ability to bring head into more upright position, but increased tone noted in R>L SCM. Head positioning / neck stretches initially uncomfortable to pt.  But improves over time. Eventual occiput to t-ball and hold for several minutes for reduced tone.    Pt guided into stance x 2 and assisted MaxA +2 for standing bouts of 27min/ . L knee locked into extension and R knee demos intermittent increase in tone into extension with ability to hold BLE in stance. Requires MaxA +2 to maintain hip extension, neutral spine, scapular retraction, and cervical neck extension.   NMR performed for improvements in motor control, cognition, and maintaining body mobility for future purposeful movements.   Patient seated upright in TIS w/c at end of session with brakes locked, belt alarm set, and all needs within reach. Pillow props to BUE with comfy brace donned to RUE.    Therapy Documentation Precautions:  Precautions Precautions: Fall Precaution Comments: Contact precautions & delayed processing Restrictions Weight Bearing Restrictions: No Pain:  Continued brief periods of discomfort with gross joint mobility.  Therapy/Group: Individual Therapy  Loel Dubonnet PT, DPT, CSRS  12/11/2022, 5:34 PM

## 2022-12-12 NOTE — Progress Notes (Signed)
Speech Language Pathology Daily Session Note  Patient Details  Name: Nicole [name] MRN: [id number] Date of Birth: 04-10-1976  Today's Date: 12/12/2022 SLP Individual Time: 0900-1000 SLP Individual Time Calculation (min): 60 min  Short Term Goals: Week 4: SLP Short Term Goal 1 (Week 4): Pt will present w/ appropriate response to functional stimuli (i.e. accept spoon, grasp basic item appropriately, response to tactile stimuli) 15% of the time w/ maxA multimodal cues SLP Short Term Goal 2 (Week 4): Patient will follow 1-step commands within a context in 10% of opportunities with Max A multimodal cues. SLP Short Term Goal 3 (Week 4): Patient will utilize multimodal communication to answer basic yes/no questions with Max A multimodal cues in 10% of opportunities  Skilled Therapeutic Interventions:  Pt was seen in am to address cognitive re- training. Pt was alert and seated upright in TIS WC. Pt with strong head turn toward right. She was responsive to clinician voice calling her name c/b turning to midline however unable to complete turn to left side. SLP challenging pt this date through completion of functional task. Pt transitioned to the sink where suction toothbrush was set up. SLP removed mit and placed toothbrush in pt's hand. Given hand over hand assist, pt accepted bolus orally with SLP beginning motion of brushing. Pt with no continuationof motion after hand over hand was released. Toothbrush settled into buccal cavity. SLP repeated motion and hand over hand with pt accepting toothbrush orally however, shortly closing her mouth with difficulty completing thorough oral hygiene. Pt completed 1 step directions with total A. Pt did not complete any motion of brushing. SLP assisted pt in brushing her hair. Again, hand over hand assist initially provided in brushing her hand. SLP providing assist for tighter grip around handle with limited carryover after hand over hand released. Pt did not continue  motion of brushing in any opportunity. SLP finally assisted pt in washing her face. Hand over hand provided to initiate task with no follow through after release. In additional minutes of session, SLP challenged pt in communication through multimodal communication. Given visual prompts presented on lap table. Pt challenged to answer biographical information. Pt requiring total A to point to correct answer. SLP also challenging pt to follow simple directions. SLP challenged pt to squeeze SLP's hand. Pt noted with increased movement in middle finger upon command however no complete squeeze. Pt was left seated upright in WC with call button within reach and chair alarm active. SLP to continue POC.   Pain Pain Assessment Pain Scale: Faces Pain Score: 0-No pain  Therapy/Group: Individual Therapy  [name] 12/12/2022, 9:52 AM

## 2022-12-12 NOTE — Progress Notes (Signed)
Physical Therapy Session Note  Patient Details  Name: Nicole [name] MRN: [id number] Date of Birth: Jul 12, 1975  Today's Date: 12/12/2022 PT Individual Time: 1415-1445 PT Individual Time Calculation (min): 30 min   Short Term Goals: Week 3:  PT Short Term Goal 1 (Week 3): Pt will initiate bed mobility but require no more than totalA PT Short Term Goal 2 (Week 3): Pt will maintain sitting balance unsupported for 30 seconds PT Short Term Goal 3 (Week 3): Pt will demonstrate command follow for ~15% of tasks.  Skilled Therapeutic Interventions/Progress Updates:    Pt recd as hand off from OT. Pt unable to report on pain, but demonstrates discomfort with stretching initially which relieved quickly.   Session focused on passive stretching for improved ROM. Therapist provided hamstring and heel cord stretching in chair to tolerance with deep pressure for heel cord release. Once in bed, therapist provided LUE stretching to allow for placement of splints.   Pt participated in tot A squat pivot to bed and tot A sit>supine. Pt required CGA-min for sitting EOB. Pt then set up with PRAFOs and UE splints, with UE supported on pillows for comfort and ROM.     Therapy Documentation Precautions:  Precautions Precautions: Fall Precaution Comments: Contact precautions & delayed processing Restrictions Weight Bearing Restrictions: No General:       Therapy/Group: Individual Therapy  Juluis Rainier 12/12/2022, 3:45 PM

## 2022-12-13 ENCOUNTER — Other Ambulatory Visit: Payer: Self-pay

## 2022-12-13 MED ORDER — TIZANIDINE HCL 4 MG PO TABS
4.0000 mg | ORAL_TABLET | Freq: Every day | ORAL | Status: DC
  Administered 2022-12-13 – 2022-12-16 (×4): 4 mg via ORAL
  Filled 2022-12-13 (×4): qty 1

## 2022-12-13 NOTE — Progress Notes (Signed)
Physical Therapy Weekly Progress Note  Patient Details  Name: Nicole [name] MRN: [id number] Date of Birth: 03/18/1976  Beginning of progress report period: December 07, 2022 End of progress report period: December 13, 2022  {CHL IP REHAB PT TIME CALCULATION:[id number]}  Patient has met {number 1-5:22450} of {number 1-5:20334} short term goals.  ***  Patient continues to demonstrate the following deficits muscle weakness, decreased cardiorespiratoy endurance, abnormal tone, decreased coordination, and decreased motor planning, decreased midline orientation, decreased attention to left, and decreased attention to right, decreased attention, decreased awareness, decreased safety awareness, and delayed processing, and decreased sitting balance, decreased standing balance, decreased postural control, and decreased balance strategies and therefore will continue to benefit from skilled PT intervention to increase functional independence with mobility.  Patient not progressing toward long term goals.  See goal revision..  Plan of care revisions: ***.  PT Short Term Goals {WUJ:[id number]}  Skilled Therapeutic Interventions/Progress Updates:  Patient supine in bed on entrance to room. Patient awake/ alert and demos turn of eyes to L and head turn to L with verbal recognition of voice from doorway while therapist donning PPE for contact precautions.   Patient with no pain complaint at start of session.  Therapeutic Activity: Bed Mobility: Pt performed supine <> sit with ***. VC/ tc required for ***.  Neuromuscular Re-ed: NMR facilitated during session with focus on***. Pt guided in ***. NMR performed for improvements in motor control and coordination, balance, sequencing, judgement, and self confidence/ efficacy in performing all aspects of mobility at highest level of independence.    Patient *** at end of session with brakes locked, *** alarm set, and all needs within reach.   Therapy  Documentation Precautions:  Precautions Precautions: Fall Precaution Comments: Contact precautions & delayed processing Restrictions Weight Bearing Restrictions: No General:   Vital Signs:   Pain: Pain Assessment Pain Scale: Faces Faces Pain Scale: No hurt Vision/Perception     Mobility:   Locomotion :    Trunk/Postural Assessment :    Balance:   Exercises:   Other Treatments:     Therapy/Group: {Therapy/Group:[id number]}  Loel Dubonnet 12/13/2022, 12:14 PM

## 2022-12-13 NOTE — Patient Care Conference (Signed)
Inpatient RehabilitationTeam Conference and Plan of Care Update Date: 12/13/2022   Time: 11:11 AM    Patient Name: Nicole [name]      Medical Record Number: [id number]  Date of Birth: 1976-05-13 Sex: Female         Room/Bed: 4W09C/4W09C-01 Payor Info: Payor: [employer] / Plan: [employer] / Product Type: *No Product type* /    Admit Date/Time:  11/18/2022  3:15 PM  Primary Diagnosis:  Hypoxic brain injury [facility])  Hospital Problems: Principal Problem:   Hypoxic brain injury [facility])    Expected Discharge Date: Expected Discharge Date:  (SNF pending)  Team Members Present: Physician leading conference: Dr. Sula Soda Social Worker Present: Dossie Der, LCSW Nurse Present: Chana Bode, RN PT Present: Casimiro Needle, PT OT Present: Bretta Bang, OT SLP Present: Feliberto Gottron, SLP PPS Coordinator present : Fae Pippin, SLP     Current Status/Progress Goal Weekly Team Focus  Bowel/Bladder   incontinent of b/b; LBM: 7/31   regain continences of b/b   assist with toileting needs prn    Swallow/Nutrition/ Hydration   Dysphagia 3 and thin liquids with full supervision- Goal met   Supervision  Goal met    ADL's   dependent in BADL, slight improvement in static sittin gbalance, improved left elbow range of motion, goals downgraded due to slow progress   max / mod assist (max / mod assist)   Improve focused attention, visual attention to right, participation in BADL    Mobility   Continued DEP care for all functional mobility. Nonverbal and no attempts at vocalization noted. Severely flexed posturing. This week has demonstrated correct and volitional clearing of throat, and a few brief instances of bringing eyes to scan L on hearing voice to L. Decreased discomfort noted following extensor stretching with time into neutral spine, shoulder, head and neck postitions.   TotA overall  Barriers: extremely low awareness, flexed posturing, no/ rare volitional  movements /// Continued focus on bed mobility, sitting balance, improving alertness and volitional movements, functional transfers, stretching into extension, any NMR and cueing for volitional movements/ motor control    Communication   total A for follwoing single command and use of multimodal communication   Max a   establish multimodal communication    Safety/Cognition/ Behavioral Observations  total A no initiation, limited mostly to reflexive responses   max A   sustain attention, initiation of functional task    Pain   pt nonverbal   remain comfortable and pain free   assess pain level via nonverbal gestures    Skin   stage 2 on L buttocks; medhoney/foam   remain free of new skin breakdown/infection  assess QS and prn      Discharge Planning:  Continue to work on SNF bed-[place] where Mom works would like to offer but needs a payment source-[employer] is a MH Medicaid trying to see if can be switched to more of a medical medicaid policy and will assist Mom with LTC medicaid application. May need to await SSD/SSI determination to have payment source once approved would get medicaid geared toward medical care.   Team Discussion: Patient post brain injury is dependent for care; no volitional movements or vocalizations with flexed posture. Does scan left to voice at times and grimaces with stretching.  Patient on target to meet rehab goals: Currently requires hand over hand for action, wearing a soft collar for neck support.   *See Care Plan and progress notes for long and  short-term goals.   Revisions to Treatment Plan:  Downgraded goals to mod - max assist   Teaching Needs: Safety, medications, skin care/wound care, transfers, toileting, nutritional means/dietary modification, etc.   Current Barriers to Discharge: Home enviroment access/layout, Incontinence, Wound care, and Lack of/limited family support  Possible Resolutions to Barriers: SNF recommended Long term  medicaid application initiated Disability application initiated     Medical Summary Current Status: spasticity, hypertension, hypoxic brain injury, decreased initiation, suboptimal vitamin D, dysphagia, dry skin  Barriers to Discharge: Medical stability  Barriers to Discharge Comments: spasticity, hypertension, hypoxic brain injury, decreased initiation, suboptimal vitamin D, dysphagia, dry skin Possible Resolutions to [employer] Focus: conitnue baclofen 84m TID, increase tizanidine to 4mg  HS, continue amantadine 100mg  BID, continue to monitor BP TID, conitnue vitamin D, continue D3, continue Eucerin, continue soft collar   Continued Need for Acute Rehabilitation Level of Care: The patient requires daily medical management by a physician with specialized training in physical medicine and rehabilitation for the following reasons: Direction of a multidisciplinary physical rehabilitation program to maximize functional independence : Yes Medical management of patient stability for increased activity during participation in an intensive rehabilitation regime.: Yes Analysis of laboratory values and/or radiology reports with any subsequent need for medication adjustment and/or medical intervention. : Yes   I attest that I was present, lead the team conference, and concur with the assessment and plan of the team.   Chana Bode B 12/13/2022, 4:03 PM

## 2022-12-13 NOTE — Progress Notes (Signed)
PROGRESS NOTE   Subjective/Complaints: Patient's chart reviewed- No issues reported overnight Vitals signs stable  Appears comfortable  ROS: Unable to obtain due to cognitive/behavioral  Objective:   No results found. Recent Labs    12/11/22 0715  WBC 9.0  HGB 14.9  HCT 45.1  PLT 329     Recent Labs    12/11/22 0715  NA 138  K 3.5  CL 102  CO2 24  GLUCOSE 107*  BUN 13  CREATININE 0.64  CALCIUM 9.2      Intake/Output Summary (Last 24 hours) at 12/13/2022 1039 Last data filed at 12/12/2022 1807 Gross per 24 hour  Intake 306 ml  Output --  Net 306 ml      Pressure Injury 10/31/22 Buttocks Left Unstageable - Full thickness tissue loss in which the base of the injury is covered by slough (yellow, tan, gray, green or brown) and/or eschar (tan, brown or black) in the wound bed. (Active)  10/31/22 2000  Location: Buttocks  Location Orientation: Left  Staging: Unstageable - Full thickness tissue loss in which the base of the injury is covered by slough (yellow, tan, gray, green or brown) and/or eschar (tan, brown or black) in the wound bed.  Wound Description (Comments):   Present on Admission: Yes     Pressure Injury 11/18/22 Toe (Comment  which one) Anterior;Right Deep Tissue Pressure Injury - Purple or maroon localized area of discolored intact skin or blood-filled blister due to damage of underlying soft tissue from pressure and/or shear. small are (Active)  11/18/22 1300  Location: Toe (Comment  which one)  Location Orientation: Anterior;Right  Staging: Deep Tissue Pressure Injury - Purple or maroon localized area of discolored intact skin or blood-filled blister due to damage of underlying soft tissue from pressure and/or shear.  Wound Description (Comments): small area on right great toe metatarsal  Present on Admission: Yes    Physical Exam: Vital Signs Blood pressure 139/80, pulse 79, temperature  97.9 F (36.6 C), temperature source Oral, resp. rate 16, height 5\' 1"  (1.549 m), weight 47.4 kg, SpO2 100%.  Gen: no distress, lying in bed, appears comfortable, fatigued HEENT: oral mucosa pink and moist, NCAT.  Scaling rash on posterior scalp. Cardio: RRR Chest: CTAB, normal effort, normal rate of breathing Abd: soft, NT, non-distended, positive bowel sounds Ext: no edema Psych: Flat appearing affect Skin: Macerated left hand - healing + Scaling dried skin on head - stable, stage 2 to right toe, area of redness between left thumb and index finger  Neurologic: Does not respond to orientation questions, does not track, does not follow cues. Opens eyes to stimuli Tone: MAS 3 right elbow, right knee, and right hip; MAS 3 L elbow otherwise resists ROM Cerebellar exam unable to assess  Musculoskeletal: reduced bilateral shoulder and elbow ROM due to increased tone  Elbow brace in place Strong neck flexion   Assessment/Plan: 1. Functional deficits which require 3+ hours per day of interdisciplinary therapy in a comprehensive inpatient rehab setting. Physiatrist is providing close team supervision and 24 hour management of active medical problems listed below. Physiatrist and rehab team continue to assess barriers to discharge/monitor patient progress toward functional  and medical goals  Care Tool:  Bathing    Body parts bathed by patient: Chest, Face   Body parts bathed by helper: Right arm, Left arm, Chest, Abdomen, Front perineal area, Buttocks, Right upper leg, Left upper leg, Right lower leg, Left lower leg, Face Body parts n/a: Right arm, Right upper leg, Left arm, Left upper leg, Chest, Abdomen, Right lower leg, Front perineal area, Left lower leg, Buttocks, Face   Bathing assist Assist Level: Dependent - Patient 0%     Upper Body Dressing/Undressing Upper body dressing   What is the patient wearing?: Hospital gown only    Upper body assist Assist Level: Dependent - Patient  0%    Lower Body Dressing/Undressing Lower body dressing      What is the patient wearing?: Incontinence brief     Lower body assist Assist for lower body dressing: Dependent - Patient 0%     Toileting Toileting Toileting Activity did not occur (Clothing management and hygiene only): N/A (no void or bm)  Toileting assist Assist for toileting: Dependent - Patient 0%     Transfers Chair/bed transfer  Transfers assist  Chair/bed transfer activity did not occur: Safety/medical concerns  Chair/bed transfer assist level: Maximal Assistance - Patient 25 - 49%     Locomotion Ambulation   Ambulation assist   Ambulation activity did not occur: Safety/medical concerns          Walk 10 feet activity   Assist  Walk 10 feet activity did not occur: Safety/medical concerns        Walk 50 feet activity   Assist Walk 50 feet with 2 turns activity did not occur: Safety/medical concerns         Walk 150 feet activity   Assist Walk 150 feet activity did not occur: Safety/medical concerns         Walk 10 feet on uneven surface  activity   Assist Walk 10 feet on uneven surfaces activity did not occur: Safety/medical concerns         Wheelchair     Assist Is the patient using a wheelchair?: Yes Type of Wheelchair: Manual (tilt in space)    Wheelchair assist level: Dependent - Patient 0%      Wheelchair 50 feet with 2 turns activity    Assist    Wheelchair 50 feet with 2 turns activity did not occur: Safety/medical concerns   Assist Level: Dependent - Patient 0%   Wheelchair 150 feet activity     Assist  Wheelchair 150 feet activity did not occur: Safety/medical concerns   Assist Level: Dependent - Patient 0%   Blood pressure 139/80, pulse 79, temperature 97.9 F (36.6 C), temperature source Oral, resp. rate 16, height 5\' 1"  (1.549 m), weight 47.4 kg, SpO2 100%.  Medical Problem List and Plan: 1. 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 Extubated 11/05/2022 Fluctuating level of alertness             -patient may  shower  Elbow splint and PRAFOs ordered             -ELOS/Goals: min/modA but prognosis is guarded due to severe cognitive deficits goals 28-30d  -Continue CIR therapies including PT, OT, and SLP    [facility] bed ordered  Mother updated  Palliative care consulted, discussed home with hospice but patient is not a candidate  MRI brain ordered given decline in speech-->no acute abnormality evident  Observed therapy, discussed disposition with SW, applying for nursing home placement  -discussed with PT and SW applying for nursing homes  -obtaining guardianship discussed with mother, form signed for her  Team conference 7/24  2.  Impaired mobility: continue Lovenox             -antiplatelet therapy: N/A 3. Pain Management: Tylenol as needed 4. Mood/Behavior/Sleep: Provide emotional support             -antipsychotic agents: N/A  5. Neuropsych/cognition: This patient is not capable of making decisions on her own behalf. 6. Left buttock unstageable ulcer.  Continue Medihoney to buttock wound daily cover with foam dressing.  Changing dressing every 3 days or as needed soiling   - hydrocortisone cream to scalp for rash 7/20  7. Fluids/Electrolytes/Nutrition: Routine in and outs with follow-up chemistries  -Labs 7/21 stable.  -Appears to have good p.o. intake  8.  Cavitary right lower lobe pneumonia likely aspiration pneumonia/MRSA pneumonia.  Continue course of Zyvox as indicated per infectious disease  -Labs 7/21 stable.  9.  History of drug abuse.  Positive cocaine on urine drug screen.  Provide counseling  10.  AKI/hypovolemia and ATN.  Renal function much improved.  Repeat Cr today.  -7/27 Cr stable 0.52 yesterday  -7/28 recheck BMP tomorrow    Latest Ref Rng & Units 12/11/2022    7:15 AM 12/08/2022    6:21 AM 12/03/2022     7:18 AM  BMP  Glucose 70 - 99 mg/dL 962   92   BUN 6 - 20 mg/dL 13   19   Creatinine 9.52 - 1.00 mg/dL 8.41  3.24  4.01   Sodium 135 - 145 mmol/L 138   137   Potassium 3.5 - 5.1 mmol/L 3.5   3.7   Chloride 98 - 111 mmol/L 102   103   CO2 22 - 32 mmol/L 24   24   Calcium 8.9 - 10.3 mg/dL 9.2   8.9     11.  Mild transaminitis with rhabdomyolysis.  CK reviewed and normalized  12.  Hypotension. Resolved, d/c midodrine  13. Impaired initiation: continue amantadine back to 100mg  BID  14. Suboptimal vitamin D: continue 1,000U D3 daily  15. Spasticity:   -Remains extremely tight, decrease baclofen to 15mg  TID due to fatigue. Appears to have increased sedation with this increase, continue to monitor. Continue wrist, elbow brace, and PRAFOs. Add tizanidine 2mg  HS  16. Bowel and bladder incontinence: continue bowel and bladder program   17. Left hand skin maceration: Eucerin ordered, discussed hand split with OT, conitnue  18. Tachycardia: decreased amantadine back to 100mg  BID, continue this dose  19. HTN: decrease amantadine to 100mg  BID. BP reviewed and has stabilized, continue this dose. Increase tizanidine HS to 4mg      12/13/2022    5:55 AM 12/12/2022    9:12 PM 12/12/2022    1:07 PM  Vitals with BMI  Systolic 139 121 027  Diastolic 80 78 94  Pulse 79 92 82    20. Area of redness between left thumb and index finger: continue foam dressing  21. Scalp eczema: selsun blue ordered, continue  22. Fatigue: decrease baclofen to 15mg  TID  LOS: 25 days A FACE TO FACE EVALUATION WAS PERFORMED  Drema Pry Alishea Beaudin 12/13/2022, 10:39 AM

## 2022-12-13 NOTE — Plan of Care (Signed)
  Problem: RH Swallowing Goal: LTG Patient will consume least restrictive diet using compensatory strategies with assistance (SLP) Description: LTG:  Patient will consume least restrictive diet using compensatory strategies with assistance (SLP) Outcome: Completed/Met   Problem: RH Comprehension Communication Goal: LTG Patient will comprehend basic/complex auditory (SLP) Description: LTG: Patient will comprehend basic/complex auditory information with cues (SLP). Outcome: Not Applicable Note: Goal discharged due to minimal progress.   Problem: RH Expression Communication Goal: LTG Patient will verbally express basic/complex needs(SLP) Description: LTG:  Patient will verbally express basic/complex needs, wants or ideas with cues  (SLP) Outcome: Not Applicable Note: Goal discharged due to minimal progress.  Goal: LTG Patient will increase speech intelligibility (SLP) Description: LTG: Patient will increase speech intelligibility at word/phrase/conversation level with cues, % of the time (SLP) Outcome: Not Applicable Note: Goal discharged due to minimal progress   Problem: RH Problem Solving Goal: LTG Patient will demonstrate problem solving for (SLP) Description: LTG:  Patient will demonstrate problem solving for basic/complex daily situations with cues  (SLP) Outcome: Not Applicable Note: Goal discharged due to limited progress   Problem: RH Attention Goal: LTG Patient will demonstrate this level of attention during functional activites (SLP) Description: LTG:  Patient will will demonstrate this level of attention during functional activites (SLP) Outcome: Not Applicable Flowsheets (Taken 12/13/2022 1231) Patient will demonstrate during cognitive/linguistic activities the attention type of: Focused Patient will demonstrate this level of attention during cognitive/linguistic activities in: Controlled LTG: Patient will demonstrate this level of attention during cognitive/linguistic  activities with assistance of (SLP): (in 25% of opportunities) Moderate Assistance - Patient 50 - 74%

## 2022-12-13 NOTE — Progress Notes (Signed)
Speech Language Pathology Daily Session Note  Patient Details  Name: Nicole [name] MRN: [id number] Date of Birth: 24-May-1975  Today's Date: 12/13/2022 SLP Individual Time: 0900-1000 SLP Individual Time Calculation (min): 60 min  Short Term Goals: Week 4: SLP Short Term Goal 1 (Week 4): Pt will present w/ appropriate response to functional stimuli (i.e. accept spoon, grasp basic item appropriately, response to tactile stimuli) 15% of the time w/ maxA multimodal cues SLP Short Term Goal 2 (Week 4): Patient will follow 1-step commands within a context in 10% of opportunities with Max A multimodal cues. SLP Short Term Goal 3 (Week 4): Patient will utilize multimodal communication to answer basic yes/no questions with Max A multimodal cues in 10% of opportunities  Skilled Therapeutic Interventions:  Pt was seen in am to address cognitive re- training. Pt was alert and seen in bed upon SLP arrival. Pt observed to turn her head to the left upon hearing knock at the door. Pt able to repeat head turn to left as clinician called her name. This action was observed x3. SLP approached pt to greet her on left side with pt able to meet and sustain eye contact for ~3-8 seconds in 3 opportunities. SLP repositioned pt upright in order to address appropriate use of functional stimulus. SLP provided set up A for oral hygiene. Pt opened her mouth in a more reflexive response to stimulus coming toward her vs responding to clinician instruction. Initial hand over hand assist provided to initiate task, pt with no carryover of motion after release. Toothbrush fell into buccal cavity in each attempt. [name] breakfast tray brought in and SLP assisted pt in feeding. Banana placed in pt's right hand. She was able to administer food item given moderate tactile movement of her elbow toward oral cavity. After deglutition, pt observed opening her mouth and moving her head in search of bolus. Again moderate movement of elbow toward mouth  facilitated pt ability to self feed. Max to total A necessary for use of utensil and subsequent feeding. SLP challenged pt in use of multimodal communication via placing yes/ no visual stimulus in front of pt and challenging pt to request additional food. Pt required total A in all opportunities. Pt was left at bedside with call button within reach. SLP to continue POC.   Pain Pain Assessment Pain Scale: Faces Faces Pain Scale: No hurt  Therapy/Group: Individual Therapy  [name] 12/13/2022, 9:58 AM

## 2022-12-13 NOTE — Progress Notes (Signed)
Occupational Therapy Session Note  Patient Details  Name: NYSIA GARDINO MRN: [id number] Date of Birth: [birth year]  Today's Date: 12/13/2022 OT Individual Time: 1305-1400 OT Individual Time Calculation (min): 55 min    Short Term Goals: Week 3:  OT Short Term Goal 1 (Week 3): pt will tolerate tilt table to at least 60* for 3 consecutive sessions as precursor to higher level functional mobility tasks OT Short Term Goal 2 (Week 3): pt will respond to verbal stimuli 3/5 trials to improve attention to task OT Short Term Goal 3 (Week 3): pt will maintain static sitting balance EOB with CGA for at least 10 secs as precursor to higher level ADl tasks  Skilled Therapeutic Interventions/Progress Updates:    Patient received supine in bed.  Immediately turned her head toward sound of voice on left side!  Consistently turned head to left throughout this session.  Head and eyes moving left to auditory stimulation, specifically to her name being called although not making eye contact.   Assisted patient to shower chair and bathed patient/ shampooed hair.  Patient has right hand in haor immediately as water turned on, rubbing head as if washing hair - difficult to say if this was perseverative behavior or prefunctional.  Hand actually moved to head once water on.  Will continue to monitor.   Patient transported to gym to address standing.  Patient showing ability to stand and accept weight on BLE.  Assisted patient to shift weight right left, worked to improve alignment of  feet/ankles, hips, knees, trunk.  Patient needs increased assistance to align head over base of support.  Patient able to stand for up to 30 seconds with max assist.   Patient returned to room, and left up in wheelchair with soft neck collar, elbow brace on R arm, sheepskin palm protector in left hand, Grip support in R hand, lap tray across wheelchair with pillow, and reclined with B legreats in place.  Patient with brief break before next  therapy session.    Therapy Documentation Precautions:  Precautions Precautions: Fall Precaution Comments: Contact precautions & delayed processing Restrictions Weight Bearing Restrictions: No   Pain:  More tolerant of passive stretch to elbows this session     Therapy/Group: Individual Therapy  [name] 12/13/2022, 3:08 PM

## 2022-12-13 NOTE — Plan of Care (Signed)
  Problem: RH Pre-functional/Other (Specify) Goal: RH LTG SLP (Specify) 1 Description: RH LTG SLP (Specify) 1 Flowsheets (Taken 12/13/2022 1241) LTG: Other SLP (Specify) 1: Pt will vocalize in response to any stimuli given max A and multimodal cues in 25% of opportunities Goal: RH LTG SLP (Specify) 2 Description: RH LTG SLP (Specify) 2 Flowsheets (Taken 12/13/2022 1241) LTG: Other SLP (Specify) 2: Pt will discriminate between two items via multimodal communication(visual tracking, vocalizing, etc.) with max A multimodal cues in 25% of opportunities.

## 2022-12-13 NOTE — Progress Notes (Signed)
Occupational Therapy Weekly Progress Note  Patient Details  Name: Nicole [name] MRN: [id number] Date of Birth: 10-12-1975  Beginning of progress report period: December 04, 2022 End of progress report period: December 13, 2022     Patient has met 2 of 3 short term goals.  Improved sitting balance, and localization to auditory stimuli  Patient continues to demonstrate the following deficits: muscle joint tightness, decreased cardiorespiratoy endurance, abnormal tone, unbalanced muscle activation, and decreased motor planning, decreased visual perceptual skills and decreased visual motor skills, decreased midline orientation, decreased attention to left, and decreased motor planning, decreased initiation, decreased attention, decreased awareness, decreased problem solving, decreased safety awareness, decreased memory, and delayed processing, central origin, and decreased sitting balance, decreased standing balance, decreased postural control, hemiplegia, and decreased balance strategies and therefore will continue to benefit from skilled OT intervention to enhance overall performance with BADL.  Patient progressing toward long term goals..  Plan of care revisions: downgraded long term goals.  OT Short Term Goals Week 3:  OT Short Term Goal 1 (Week 3): pt will tolerate tilt table to at least 60* for 3 consecutive sessions as precursor to higher level functional mobility tasks OT Short Term Goal 1 - Progress (Week 3): Not met OT Short Term Goal 2 (Week 3): pt will respond to verbal stimuli 3/5 trials to improve attention to task OT Short Term Goal 2 - Progress (Week 3): Met OT Short Term Goal 3 (Week 3): pt will maintain static sitting balance EOB with CGA for at least 10 secs as precursor to higher level ADl tasks OT Short Term Goal 3 - Progress (Week 3): Met Week 4:  OT Short Term Goal 1 (Week 4): Patient will sit unsupported at edge of bed x 15 seconds in preparation for level surface transfer OT Short  Term Goal 2 (Week 4): Patient will transition from sit to stand and stand to sit as needed for lower body dressing with max assist OT Short Term Goal 3 (Week 4): Patient will turn head/ eyes toward auditory stimulation either right or left 25%/ time to aide in focused attention OT Short Term Goal 4 (Week 4): Patient will participate following hand over hand assistance in at least one familiar functional task x 5-10 seconds.      Therapy Documentation Precautions:  Precautions Precautions: Fall Precaution Comments: Contact precautions & delayed processing Restrictions Weight Bearing Restrictions: No    [name] 12/13/2022, 3:27 PM

## 2022-12-14 NOTE — Progress Notes (Signed)
Speech Language Pathology Daily Session Note  Patient Details  Name: HUTTON VANO MRN: [id number] Date of Birth: [birth year]  Today's Date: 12/14/2022 SLP Individual Time: 1430-1500 SLP Individual Time Calculation (min): 30 min  Short Term Goals: Week 4: SLP Short Term Goal 1 (Week 4): Pt will present w/ appropriate response to functional stimuli (i.e. accept spoon, grasp basic item appropriately, response to tactile stimuli) 15% of the time w/ maxA multimodal cues SLP Short Term Goal 2 (Week 4): Patient will follow 1-step commands within a context in 10% of opportunities with Max A multimodal cues. SLP Short Term Goal 3 (Week 4): Patient will utilize multimodal communication to answer basic yes/no questions with Max A multimodal cues in 10% of opportunities  Skilled Therapeutic Interventions:   Pt greeted at bedside. She was awake and alert upon SLP arrival. SLP facilitated a variety of functional tx tasks targeting cognition and response to stimuli. During oral care, pt benefited from hand over hand assist. Pt unable to continue motion w/o SLP assistance so hand over hand assist continued throughout task. After initial hand over hand assist to wipe her face with a washcloth, she was able to continue the motion around her mouth for ~5 seconds before initiation of bite reflex. SLP utilized errorless learning to review orientation information and pt noted to look towards SLP/calendar x1. Pt demonstrated improved visual tracking as compared to prev tx sessions, as she turned to look at SLP upon entrance, followed her across midline x3, and alternated L to R to visualize music x1. SLP utilized hand over hand assist to encourage tapping or any functional movement w/ pt preferred music. Pt appeared to relax her RUE during the song, though no continuation of  tapping noted. SLP removed the pt's soft collar before departure as she appeared to be cutting off her circulation at times w/ her head movements. She was  left in her bed with the alarm set and call light within reach. Recommend cont ST per POC.   Pain Pain Assessment Pain Scale: Faces Faces Pain Scale: No hurt  Therapy/Group: Individual Therapy  [name] 12/14/2022, 5:10 PM

## 2022-12-14 NOTE — Progress Notes (Signed)
PROGRESS NOTE   Subjective/Complaints: Patient's chart reviewed- No issues reported overnight Vitals signs stable  Continues to have decreased attention  ROS: Unable to obtain due to cognitive/behavioral  Objective:   No results found. No results for input(s): "WBC", "HGB", "HCT", "PLT" in the last 72 hours.    No results for input(s): "NA", "K", "CL", "CO2", "GLUCOSE", "BUN", "CREATININE", "CALCIUM" in the last 72 hours.     Intake/Output Summary (Last 24 hours) at 12/14/2022 0949 Last data filed at 12/13/2022 2152 Gross per 24 hour  Intake 537 ml  Output --  Net 537 ml      Pressure Injury 10/31/22 Buttocks Left Unstageable - Full thickness tissue loss in which the base of the injury is covered by slough (yellow, tan, gray, green or brown) and/or eschar (tan, brown or black) in the wound bed. (Active)  10/31/22 2000  Location: Buttocks  Location Orientation: Left  Staging: Unstageable - Full thickness tissue loss in which the base of the injury is covered by slough (yellow, tan, gray, green or brown) and/or eschar (tan, brown or black) in the wound bed.  Wound Description (Comments):   Present on Admission: Yes     Pressure Injury 11/18/22 Toe (Comment  which one) Anterior;Right Deep Tissue Pressure Injury - Purple or maroon localized area of discolored intact skin or blood-filled blister due to damage of underlying soft tissue from pressure and/or shear. small are (Active)  11/18/22 1300  Location: Toe (Comment  which one)  Location Orientation: Anterior;Right  Staging: Deep Tissue Pressure Injury - Purple or maroon localized area of discolored intact skin or blood-filled blister due to damage of underlying soft tissue from pressure and/or shear.  Wound Description (Comments): small area on right great toe metatarsal  Present on Admission: Yes    Physical Exam: Vital Signs Blood pressure 115/84, pulse 87,  temperature 98.1 F (36.7 C), temperature source Oral, resp. rate 16, height 5\' 1"  (1.549 m), weight 47.9 kg, SpO2 100%.  Gen: no distress, lying in bed, appears comfortable, BMI 199.95 HEENT: oral mucosa pink and moist, NCAT.  Scaling rash on posterior scalp. Cardio: RRR Chest: CTAB, normal effort, normal rate of breathing Abd: soft, NT, non-distended, positive bowel sounds Ext: no edema Psych: Flat appearing affect Skin: Macerated left hand - healing + Scaling dried skin on head - stable, stage 2 to right toe, area of redness between left thumb and index finger  Neurologic: Does not respond to orientation questions, does not track, does not follow cues. Opens eyes to stimuli Tone: MAS 3 right elbow, right knee, and right hip; MAS 3 L elbow otherwise resists ROM Cerebellar exam unable to assess  Musculoskeletal: reduced bilateral shoulder and elbow ROM due to increased tone  Elbow brace in place Strong neck flexion   Assessment/Plan: 1. Functional deficits which require 3+ hours per day of interdisciplinary therapy in a comprehensive inpatient rehab setting. Physiatrist is providing close team supervision and 24 hour management of active medical problems listed below. Physiatrist and rehab team continue to assess barriers to discharge/monitor patient progress toward functional and medical goals  Care Tool:  Bathing    Body parts bathed by patient: Chest, Face  Body parts bathed by helper: Right arm, Left arm, Chest, Abdomen, Front perineal area, Buttocks, Right upper leg, Left upper leg, Right lower leg, Left lower leg, Face Body parts n/a: Right arm, Right upper leg, Left arm, Left upper leg, Chest, Abdomen, Right lower leg, Front perineal area, Left lower leg, Buttocks, Face   Bathing assist Assist Level: Dependent - Patient 0%     Upper Body Dressing/Undressing Upper body dressing   What is the patient wearing?: Hospital gown only    Upper body assist Assist Level:  Dependent - Patient 0%    Lower Body Dressing/Undressing Lower body dressing      What is the patient wearing?: Incontinence brief     Lower body assist Assist for lower body dressing: Dependent - Patient 0%     Toileting Toileting Toileting Activity did not occur (Clothing management and hygiene only): N/A (no void or bm)  Toileting assist Assist for toileting: Dependent - Patient 0%     Transfers Chair/bed transfer  Transfers assist  Chair/bed transfer activity did not occur: Safety/medical concerns  Chair/bed transfer assist level: Maximal Assistance - Patient 25 - 49%     Locomotion Ambulation   Ambulation assist   Ambulation activity did not occur: Safety/medical concerns          Walk 10 feet activity   Assist  Walk 10 feet activity did not occur: Safety/medical concerns        Walk 50 feet activity   Assist Walk 50 feet with 2 turns activity did not occur: Safety/medical concerns         Walk 150 feet activity   Assist Walk 150 feet activity did not occur: Safety/medical concerns         Walk 10 feet on uneven surface  activity   Assist Walk 10 feet on uneven surfaces activity did not occur: Safety/medical concerns         Wheelchair     Assist Is the patient using a wheelchair?: Yes Type of Wheelchair: Manual (tilt in space)    Wheelchair assist level: Dependent - Patient 0%      Wheelchair 50 feet with 2 turns activity    Assist    Wheelchair 50 feet with 2 turns activity did not occur: Safety/medical concerns   Assist Level: Dependent - Patient 0%   Wheelchair 150 feet activity     Assist  Wheelchair 150 feet activity did not occur: Safety/medical concerns   Assist Level: Dependent - Patient 0%   Blood pressure 115/84, pulse 87, temperature 98.1 F (36.7 C), temperature source Oral, resp. rate 16, height 5\' 1"  (1.549 m), weight 47.9 kg, SpO2 100%.  Medical Problem List and Plan: 1. 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 Extubated 11/05/2022 Fluctuating level of alertness             -patient may  shower  Elbow splint and PRAFOs ordered             -ELOS/Goals: min/modA but prognosis is guarded due to severe cognitive deficits goals 28-30d  -Continue CIR therapies including PT, OT, and SLP    Tasia Catchings bed ordered  Mother updated  Palliative care consulted, discussed home with hospice but patient is not a candidate  MRI brain ordered given decline in speech-->no acute abnormality evident            Observed therapy, discussed disposition with SW, applying for nursing home placement  -discussed with PT and  SW applying for nursing homes  -obtaining guardianship discussed with mother, form signed for her  Team conference 7/24  2.  Impaired mobility: continue Lovenox             -antiplatelet therapy: N/A 3. Pain Management: Tylenol as needed 4. Mood/Behavior/Sleep: Provide emotional support             -antipsychotic agents: N/A  5. Neuropsych/cognition: This patient is not capable of making decisions on her own behalf. 6. Left buttock unstageable ulcer.  Continue Medihoney to buttock wound daily cover with foam dressing.  Changing dressing every 3 days or as needed soiling   - hydrocortisone cream to scalp for rash 7/20  7. Fluids/Electrolytes/Nutrition: Routine in and outs with follow-up chemistries  -Labs 7/21 stable.  -Appears to have good p.o. intake  8.  Cavitary right lower lobe pneumonia likely aspiration pneumonia/MRSA pneumonia.  Continue course of Zyvox as indicated per infectious disease  -Labs 7/21 stable.  9.  History of drug abuse.  Positive cocaine on urine drug screen.  Provide counseling  10.  AKI/hypovolemia and ATN.  Renal function much improved.  Repeat Cr today.  -7/27 Cr stable 0.52 yesterday  -7/28 recheck BMP tomorrow    Latest Ref Rng & Units 12/11/2022    7:15 AM 12/08/2022    6:21  AM 12/03/2022    7:18 AM  BMP  Glucose 70 - 99 mg/dL 347   92   BUN 6 - 20 mg/dL 13   19   Creatinine 4.25 - 1.00 mg/dL 9.56  3.87  5.64   Sodium 135 - 145 mmol/L 138   137   Potassium 3.5 - 5.1 mmol/L 3.5   3.7   Chloride 98 - 111 mmol/L 102   103   CO2 22 - 32 mmol/L 24   24   Calcium 8.9 - 10.3 mg/dL 9.2   8.9     11.  Mild transaminitis with rhabdomyolysis.  CK reviewed and normalized  12.  Hypotension. Resolved, d/c midodrine  13. Impaired initiation: continue amantadine back to 100mg  BID  14. Suboptimal vitamin D: continue 1,000U D3 daily  15. Spasticity:   -Remains extremely tight, decrease baclofen to 15mg  TID due to fatigue. Appears to have increased sedation with this increase, continue to monitor. Continue wrist, elbow brace, and PRAFOs. Add tizanidine 2mg  HS  16. Bowel and bladder incontinence: continue bowel and bladder program   17. Left hand skin maceration: Eucerin ordered, discussed hand split with OT, conitnue  18. Tachycardia: decreased amantadine back to 100mg  BID, continue this dose  19. HTN: decrease amantadine to 100mg  BID. BP reviewed and has stabilized, continue this dose. Increase tizanidine HS to 4mg . BP reviewed and improved with this change     12/14/2022    5:43 AM 12/13/2022    8:19 PM 12/13/2022    6:07 PM  Vitals with BMI  Weight   105 lbs 10 oz  BMI   19.96  Systolic 115 117   Diastolic 84 80   Pulse 87 72     20. Area of redness between left thumb and index finger: continue foam dressing  21. Scalp eczema: selsun blue ordered, continue  22. Fatigue: decrease baclofen to 15mg  TID, continue this dose  LOS: 26 days A FACE TO FACE EVALUATION WAS PERFORMED  Nicole [name] 12/14/2022, 9:49 AM

## 2022-12-14 NOTE — Progress Notes (Signed)
Orthopedic Tech Progress Note Patient Details:  Nicole [name] 1976-04-28 [id number]  Order for bilateral resting hand splints called into [facility].  Patient ID: Nicole [name], female   DOB: 06-20-1975, 47 y.o.   MRN: [id number]  Docia Furl 12/14/2022, 11:18 AM

## 2022-12-14 NOTE — Progress Notes (Signed)
Physical Therapy Session Note  Patient Details  Name: Nicole [name] MRN: [id number] Date of Birth: July 13, 1975  Today's Date: 12/14/2022 PT Individual Time:  1417-1501  PT Individual Time Calculation (min): 44 min  Short Term Goals: Week 2:  PT Short Term Goal 1 (Week 2): Pt will initiate bed mobility but require no more than totalA PT Short Term Goal 1 - Progress (Week 2): Not met PT Short Term Goal 2 (Week 2): Pt will maintain sitting balance unsupported for 30 seconds PT Short Term Goal 2 - Progress (Week 2): Not met PT Short Term Goal 3 (Week 2): Pt will demonstrate command follow for ~15% of tasks PT Short Term Goal 3 - Progress (Week 2): Not met PT Short Term Goal 4 (Week 2): Pt will be assessed for custom wheelchair needs PT Short Term Goal 4 - Progress (Week 2): Not met Week 3:  PT Short Term Goal 1 (Week 3): Pt will initiate bed mobility but require no more than totalA PT Short Term Goal 2 (Week 3): Pt will maintain sitting balance unsupported for 30 seconds PT Short Term Goal 3 (Week 3): Pt will demonstrate command follow for ~15% of tasks.   Skilled Therapeutic Interventions/Progress Updates:  Patient seated upright in w/c with pillow prop to BUE on top of full lap tray on entrance to room. Head fully flexed forward despite soft c-collar donned. Patient awake/ alert and demos turn of eyes to L briefly with verbal recognition of pt from therapist while donning PPE for contact precautions.    Patient with no indication of pain at start of session. Is seated in very flexed position despite back tilt of TIS w/c. Heavy/ strong neck flexion. Comfy brace donned to RUE.   Therapeutic Activity/NMR:  Pt guided into stance and assisted MaxA +2 for standing bout of . L knee locked into extension and R knee demos increased tone into extension with ability to hold BLE steady in stance. Requires MaxA +2 to maintain hip extension, neutral spine, scapular retraction, and cervical neck  extension. After 4 min, noted increase in redness to face and R hand. Potential reaction to continued discomfort against tone.   Seated activity with cone and beanbag in attempt to increase awareness and follow colored object with eyes/ head turns. Minimal recognition of object and without tactile stimulation, pt easily falls asleep.    NMR performed for improvements in motor control, cognition, and maintaining body mobility for future purposeful movements.     Patient seated upright in TIS w/c at end of session with brakes locked, belt alarm set, and all needs within reach. Pillow props to BUE with comfy brace donned to LUE.  Therapy Documentation Precautions:  Precautions Precautions: Fall Precaution Comments: Contact precautions & delayed processing Restrictions Weight Bearing Restrictions: No  Pain: Continued, but improving, brief periods of discomfort with gross joint mobility.   Therapy/Group: Individual Therapy  Loel Dubonnet PT, DPT, CSRS 12/13/2022, 6:48 PM

## 2022-12-14 NOTE — Progress Notes (Signed)
Physical Therapy Session Note  Patient Details  Name: Nicole [name] MRN: [id number] Date of Birth: [birth year]  Today's Date: 12/14/2022 PT Individual Time: 1005-1105 PT Individual Time Calculation (min): 60 min   Short Term Goals: Week 4:  PT Short Term Goal 1 (Week 4): Pt will initiate bed mobility but require no more than totalA. PT Short Term Goal 2 (Week 4): Pt will maintain sitting balance unsupported for 30 seconds PT Short Term Goal 3 (Week 4): Pt will demonstrate command follow for ~15% of tasks.   Skilled Therapeutic Interventions/Progress Updates:    Pt presents in room in [facility], demonstrates turning head and eye contact with therapist when announcing arrival to room. Pt also demonstrating improved posture with soft collar donned however bilateral UE remain flexed. Comfort brace to R elbow. Pt in no visible pain on arrival, unable to verbalize at this time. Session focused on NMR to decreased global flexor tone, promote tracking and response to auditory stimuli, and improved tolerance to upright in standing frame.  Pt tray removed and pt transported dependently to dayroom. Pt tolerates stretching for BLE hamstrings/gastroc x60 sec each and BUE biceps 60 sec each with pt demonstrating improved tone with stretching. Pt completes total assist squat pivot wc to mat to complete challenge to sitting balance, balance reactions and facilitate moro reflex however pt able to maintain upright ~2sec with min assist prior to LOB posteriorly, no reflex present.   Pt attempts sit<>stand with max assist x2 however demonstrates increased flexor tone with stand. Pt completes scooting laterally along EOM total assist for positioning in preparation for standing frame. Pt tolerates standing frame for 5.5 minutes, therapist facilitating stretching for hips and shoulders for upright posture. Pt continues to demonstrate significantly forward flexed head in standing despite soft collar. Therapist provides gentle  assist to promote cervical extension to neutral while standing however pt unable to maintain. Pt provided with auditory stimuli throughout however demonstrates decreased ability to turn head and scan with fatigue. Pt returns to sitting on EOM and demonstrates increased redness in face. Pt transferred total assist to [facility] and positioned with tilt with pt demonstrating resolution of facial redness and no other symptoms present.  Pt positioned with comfort brace to L elbow and full tray repositioned on pt WC with pillow placed for comfort. Pt returns to room and remains seated in [facility] with all needs within reach, cal light in place and chair alarm donned and activated at end of session.  Therapy Documentation Precautions:  Precautions Precautions: Fall Precaution Comments: Contact precautions & delayed processing Restrictions Weight Bearing Restrictions: No   Therapy/Group: Individual Therapy  Edwin Cap PT, DPT 12/14/2022, 11:17 AM

## 2022-12-14 NOTE — Progress Notes (Signed)
Physical Therapy Session Note  Patient Details  Name: Nicole [name] MRN: [id number] Date of Birth: [birth year]  Today's Date: 12/14/2022 PT Individual Time: 1305-1405 PT Individual Time Calculation (min): 60 min   Short Term Goals: Week 4:  PT Short Term Goal 1 (Week 4): Pt will initiate bed mobility but require no more than totalA. PT Short Term Goal 2 (Week 4): Pt will maintain sitting balance unsupported for 30 seconds PT Short Term Goal 3 (Week 4): Pt will demonstrate command follow for ~15% of tasks.  Skilled Therapeutic Interventions/Progress Updates: Patient in [facility] with NT and rehab tech present on entrance to room. Pt just finished up eating breakfast and was alert and agreeable to PT session.   Patient gave no indications of pain throughout session. Rehab tech present throughout PT session for + 2.   Therapeutic Activity: Bed Mobility: Pt totalA from EOB to supine on KREG bed Transfers: Pt performed sit<>stand pivot with totalA from WC to EOB.  Neuromuscular Re-ed: NMR facilitated during session with focus on postural control and standing tolerance to provided B LE proprioceptive feedback.  - Pt performed multiple bouts of standing tolerance in standing frame. PTA and rehab tech donned harness and provided pt with assistance to bring B UE's onto table top. [name] R LE posterior musculature noted to have tone as R heel could not touch floor initially while standing (progressed closer to floor towards end of session). Pt was cued to visually track for objects placed on the table (pt with little to no response). [name] head would slowly drift towards table top, and was provided with rest breaks with that being an indicator of fatigue. Pt then cued to visually stare and track fractal animations on phone with music playing. Pt noted to increase engagement (eyes followed phone upward) but pt could not sustain (moderately distractive environment). Pt then back up to standing after visually tracking  phone while in sitting and was cued to continue tracking phone screen. Pt noted to have increased participation by demonstrating raising head into extension, but was unable to maintain and quickly fatigued after.   NMR performed for improvements in motor control and coordination, balance, sequencing, judgement, and self confidence/ efficacy in performing all aspects of mobility at highest level of independence.   Patient supine in bed at end of session with brakes locked, bed alarm set, and all needs within reach.      Therapy Documentation Precautions:  Precautions Precautions: Fall Precaution Comments: Contact precautions & delayed processing Restrictions Weight Bearing Restrictions: No  Therapy/Group: Individual Therapy  Idabell Picking PTA 12/14/2022, 2:30 PM

## 2022-12-14 NOTE — Progress Notes (Signed)
Occupational Therapy Session Note  Patient Details  Name: Nicole [name] MRN: [id number] Date of Birth: [birth year]  Today's Date: 12/14/2022 OT Individual Time: 4696-2952 OT Individual Time Calculation (min): 46 min    Short Term Goals: Week 4:  OT Short Term Goal 1 (Week 4): Patient will sit unsupported at edge of bed x 15 seconds in preparation for level surface transfer OT Short Term Goal 2 (Week 4): Patient will transition from sit to stand and stand to sit as needed for lower body dressing with max assist OT Short Term Goal 3 (Week 4): Patient will turn head/ eyes toward auditory stimulation either right or left 25%/ time to aide in focused attention OT Short Term Goal 4 (Week 4): Patient will participate following hand over hand assistance in at least one familiar functional task x 5-10 seconds.  Skilled Therapeutic Interventions/Progress Updates:    Patient received supine in bed - awake and alert.  Scanning to left at sound of her name.  Sustaining eye contact left of midline x several seconds when supported in supine.  Continue to work on stimulus/ response.  Patient offered spoon in hand, and assisted toward mouth.  Patient opens mouth and brings spoon to mouth - tongue seeks bowl of spoon.  When spoon tipped upside down, patient adjusts lips and tongue.  Patient's tongue moves outside of lips to clean residual food.  Used hand over hand technique for brushing teeth.  Patient opening mouth inconsistently when directed.  This appears more reflexive/ perseverative versus volitional as related to oral care.   Removed elbow brace, ankle braces, palm protector.  Provided gentle range of motion to elbows, shoulders, forearms, wrists, hands.  Patient wincing/ grimacing with passive motion of right fingers and wrists.  This is a newer issue lately.  Requested resting hand splints bilaterally for use when not in therapy.  Patient assisted to roll side to side for donning pants- total assist.  Patient  transferred to wheelchair with max assist to complete dressing.   Patient left up in wheelchair with safety belt in place in reclined position with neck brace, elbow brace on RUE, palm protector LUE, Mitt RUE, and lap tray with pillow for UE support.   Therapy Documentation Precautions:  Precautions Precautions: Fall Precaution Comments: Contact precautions & delayed processing Restrictions Weight Bearing Restrictions: No   Pain:  Grimaces with stretch to fingers, right wrist, elbows, BLE.  Does best with slow stretch.       Therapy/Group: Individual Therapy  [name] 12/14/2022, 12:25 PM

## 2022-12-15 NOTE — Progress Notes (Signed)
Physical Therapy Session Note  Patient Details  Name: Nicole [name] MRN: [id number] Date of Birth: [birth year]  Today's Date: 12/15/2022 PT Individual Time: 1522-1605 PT Individual Time Calculation (min): 43 min   Short Term Goals: Week 3:  PT Short Term Goal 1 (Week 3): Pt will initiate bed mobility but require no more than totalA PT Short Term Goal 1 - Progress (Week 3): Not met PT Short Term Goal 2 (Week 3): Pt will maintain sitting balance unsupported for 30 seconds PT Short Term Goal 2 - Progress (Week 3): Not met PT Short Term Goal 3 (Week 3): Pt will demonstrate command follow for ~15% of tasks. PT Short Term Goal 3 - Progress (Week 3): Not met Week 4:  PT Short Term Goal 1 (Week 4): Pt will initiate bed mobility but require no more than totalA. PT Short Term Goal 2 (Week 4): Pt will maintain sitting balance unsupported for 30 seconds PT Short Term Goal 3 (Week 4): Pt will demonstrate command follow for ~15% of tasks.  Skilled Therapeutic Interventions/Progress Updates:  Patient seated upright in w/c with pillow prop to BUE on top of full lap tray on entrance to room. Head fully flexed forward despite soft c-collar donned. Patient awake/ alert and demos turn of eyes to L briefly with verbal recognition of pt from therapist while donning PPE for contact precautions.    Patient with no indication of pain at start of session. Is seated in very flexed position despite back tilt of TIS w/c. Heavy/ strong neck flexion. Comfy brace donned to RUE.    Therapeutic Activity/NMR:  Pt guided into stance using standing frame. Music played for increased awareness and eventual active participation. Requires MaxA +2 to maintain neutral spine, scapular retraction, and cervical neck extension. After , noted increase in resistance and move of head from therapist's hand d/t irritation with handhold of head for improved neck extension.     NMR performed for improvements in motor control, cognition, and  maintaining body mobility for future purposeful movements.   Noted incontinence during standing bout and pt lowered to seat and brought back to room. RE-donned resting WHOs. Doffed soft c-collar after supine in good position.     Patient supine in bed with all needs within reach. Pillow props to BUE.    Therapy Documentation Precautions:  Precautions Precautions: Fall Precaution Comments: Contact precautions & delayed processing Restrictions Weight Bearing Restrictions: No  Pain: Pain Assessment Pain Scale: Faces Faces Pain Scale: No hurt noted throughout session with minimal demos of discomfort, especially with movements to RUE about wrist.   Therapy/Group: Individual Therapy  Loel Dubonnet PT, DPT, CSRS 12/15/2022, 6:06 PM

## 2022-12-15 NOTE — Progress Notes (Signed)
Speech Language Pathology Daily Session Note  Patient Details  Name: Nicole [name] MRN: [id number] Date of Birth: 1975/07/14  Today's Date: 12/15/2022 SLP Individual Time: 1351-1419 SLP Individual Time Calculation (min): 28 min  Short Term Goals: Week 4: SLP Short Term Goal 1 (Week 4): Pt will present w/ appropriate response to functional stimuli (i.e. accept spoon, grasp basic item appropriately, response to tactile stimuli) 15% of the time w/ maxA multimodal cues SLP Short Term Goal 2 (Week 4): Patient will follow 1-step commands within a context in 10% of opportunities with Max A multimodal cues. SLP Short Term Goal 3 (Week 4): Patient will utilize multimodal communication to answer basic yes/no questions with Max A multimodal cues in 10% of opportunities  Skilled Therapeutic Interventions:   Pt seen for skilled SLP therapy session to address functional cognitive goals including attention and responses to stimuli. SLP prompted various tasks including visual scanning with colorful cards and calendar, simple 1-step directions, and attempted Y/N questions. Pt continuously shook head "no", even when a question was not asked. An affirmative head nod was not observed. She did not make eye contact despite cues. She appeared to gaze towards directed card ~25% of trials, though challenging to confirm since this is an eye gaze task. She was unable to tack SLPs finder across calendar this session. Pt left upright in w/c with chair alarm activated. Continue SLP PoC.   Pain Pain Assessment Pain Scale: Faces Faces Pain Scale: No hurt  Therapy/Group: Individual Therapy  Ellery Plunk 12/15/2022, 2:23 PM

## 2022-12-15 NOTE — Progress Notes (Signed)
Physical Therapy Session Note  Patient Details  Name: Nicole [name] MRN: [id number] Date of Birth: [birth year]  Today's Date: 12/15/2022 PT Individual Time: 0921-1010 PT Individual Time Calculation (min): 49 min   Short Term Goals: Week 3:  PT Short Term Goal 1 (Week 3): Pt will initiate bed mobility but require no more than totalA PT Short Term Goal 1 - Progress (Week 3): Not met PT Short Term Goal 2 (Week 3): Pt will maintain sitting balance unsupported for 30 seconds PT Short Term Goal 2 - Progress (Week 3): Not met PT Short Term Goal 3 (Week 3): Pt will demonstrate command follow for ~15% of tasks. PT Short Term Goal 3 - Progress (Week 3): Not met Week 4:  PT Short Term Goal 1 (Week 4): Pt will initiate bed mobility but require no more than totalA. PT Short Term Goal 2 (Week 4): Pt will maintain sitting balance unsupported for 30 seconds PT Short Term Goal 3 (Week 4): Pt will demonstrate command follow for ~15% of tasks.  Skilled Therapeutic Interventions/Progress Updates:  Patient supine in bed on entrance to room. Patient awake/ alert and demos turn of eyes to L and head turn to L with verbal recognition of voice from doorway while therapist donning PPE for contact precautions. Pt is able to hold gaze for 20sec. Also demos brief eye turns and holds of gaze intermittently throughout session. [place] when relaxed in supine positioning.   Patient with no pain complaint at start of session.   Therapeutic Activity/NMR: Pt requires adjustment of elbow brace to opposite arm (RUE) in order to promote continued movement into extension to improve overall elbow ROM and potential future volitional use.    Bed positioned in chair position following breakfast with NT. Head flexed with chin to chest and bed repositioned for more flat into supine position in order to provide stretch to body and decreased neck flexion. With time and gravity, pt is able to bring occiput back to bed surface. Continued tone  noted in Bil SCM musculature.    Bed Mobility:  Initiated dressing and requires TotA to complete. VC throughout for attempted increase in awareness and participation. Supine>sit EOB with TotA and MaxA to maintain seated position on EOB.     While seated EOB, pt provided with lateral support to thighs and posterior pelvis in order to progress proprioception and trunk control. Demos improved hold to seated position ant/ post but requires MaxA to maintain L/R.   Patient seated upright in TIS w/c at end of session with brakes locked, belt alarm set, full tray attached to w/c and arms positioned with pillow prop, soft c-collar donned, and all needs within reach.  Therapy Documentation Precautions:  Precautions Precautions: Fall Precaution Comments: Contact precautions & delayed processing Restrictions Weight Bearing Restrictions: No  Pain: Pain Assessment Pain Scale: Faces Faces Pain Scale: No hurt indicated this session.   Therapy/Group: Individual Therapy  Loel Dubonnet PT, DPT, CSRS 12/15/2022, 6:05 PM

## 2022-12-15 NOTE — Progress Notes (Signed)
PROGRESS NOTE   Subjective/Complaints: Patient's chart reviewed- No issues reported overnight Vitals signs stable  Appreciate SW update  ROS: Unable to obtain due to cognitive/behavioral  Objective:   No results found. No results for input(s): "WBC", "HGB", "HCT", "PLT" in the last 72 hours.    Recent Labs    12/15/22 1610  CREATININE 0.52       Intake/Output Summary (Last 24 hours) at 12/15/2022 1032 Last data filed at 12/15/2022 0700 Gross per 24 hour  Intake 1156 ml  Output --  Net 1156 ml      Pressure Injury 10/31/22 Buttocks Left Unstageable - Full thickness tissue loss in which the base of the injury is covered by slough (yellow, tan, gray, green or brown) and/or eschar (tan, brown or black) in the wound bed. (Active)  10/31/22 2000  Location: Buttocks  Location Orientation: Left  Staging: Unstageable - Full thickness tissue loss in which the base of the injury is covered by slough (yellow, tan, gray, green or brown) and/or eschar (tan, brown or black) in the wound bed.  Wound Description (Comments):   Present on Admission: Yes     Pressure Injury 11/18/22 Toe (Comment  which one) Anterior;Right Deep Tissue Pressure Injury - Purple or maroon localized area of discolored intact skin or blood-filled blister due to damage of underlying soft tissue from pressure and/or shear. small are (Active)  11/18/22 1300  Location: Toe (Comment  which one)  Location Orientation: Anterior;Right  Staging: Deep Tissue Pressure Injury - Purple or maroon localized area of discolored intact skin or blood-filled blister due to damage of underlying soft tissue from pressure and/or shear.  Wound Description (Comments): small area on right great toe metatarsal  Present on Admission: Yes    Physical Exam: Vital Signs Blood pressure 138/84, pulse 83, temperature 98 F (36.7 C), temperature source Oral, resp. rate 15, height 5\' 1"   (1.549 m), weight 47.9 kg, SpO2 99%.  Gen: no distress, lying in bed, appears comfortable, BMI 199.95 HEENT: oral mucosa pink and moist, NCAT.  Scaling rash on posterior scalp. Cardio: RRR Chest: CTAB, normal effort, normal rate of breathing Abd: soft, NT, non-distended, positive bowel sounds Ext: no edema Psych: Flat appearing affect Skin: Macerated left hand - healing + Scaling dried skin on head - stable, stage 2 to right toe, area of redness between left thumb and index finger, left buttock unstageable  Neurologic: Does not respond to orientation questions, does not track, does not follow cues. Opens eyes to stimuli Tone: MAS 3 right elbow, right knee, and right hip; MAS 3 L elbow otherwise resists ROM Cerebellar exam unable to assess  Musculoskeletal: reduced bilateral shoulder and elbow ROM due to increased tone  Elbow brace in place Strong neck flexion   Assessment/Plan: 1. Functional deficits which require 3+ hours per day of interdisciplinary therapy in a comprehensive inpatient rehab setting. Physiatrist is providing close team supervision and 24 hour management of active medical problems listed below. Physiatrist and rehab team continue to assess barriers to discharge/monitor patient progress toward functional and medical goals  Care Tool:  Bathing    Body parts bathed by patient: Chest, Face   Body parts  bathed by helper: Right arm, Left arm, Chest, Abdomen, Front perineal area, Buttocks, Right upper leg, Left upper leg, Right lower leg, Left lower leg, Face Body parts n/a: Right arm, Right upper leg, Left arm, Left upper leg, Chest, Abdomen, Right lower leg, Front perineal area, Left lower leg, Buttocks, Face   Bathing assist Assist Level: Dependent - Patient 0%     Upper Body Dressing/Undressing Upper body dressing   What is the patient wearing?: Hospital gown only    Upper body assist Assist Level: Dependent - Patient 0%    Lower Body  Dressing/Undressing Lower body dressing      What is the patient wearing?: Incontinence brief     Lower body assist Assist for lower body dressing: Dependent - Patient 0%     Toileting Toileting Toileting Activity did not occur (Clothing management and hygiene only): N/A (no void or bm)  Toileting assist Assist for toileting: Dependent - Patient 0%     Transfers Chair/bed transfer  Transfers assist  Chair/bed transfer activity did not occur: Safety/medical concerns  Chair/bed transfer assist level: Maximal Assistance - Patient 25 - 49%     Locomotion Ambulation   Ambulation assist   Ambulation activity did not occur: Safety/medical concerns          Walk 10 feet activity   Assist  Walk 10 feet activity did not occur: Safety/medical concerns        Walk 50 feet activity   Assist Walk 50 feet with 2 turns activity did not occur: Safety/medical concerns         Walk 150 feet activity   Assist Walk 150 feet activity did not occur: Safety/medical concerns         Walk 10 feet on uneven surface  activity   Assist Walk 10 feet on uneven surfaces activity did not occur: Safety/medical concerns         Wheelchair     Assist Is the patient using a wheelchair?: Yes Type of Wheelchair: Manual (tilt in space)    Wheelchair assist level: Dependent - Patient 0%      Wheelchair 50 feet with 2 turns activity    Assist    Wheelchair 50 feet with 2 turns activity did not occur: Safety/medical concerns   Assist Level: Dependent - Patient 0%   Wheelchair 150 feet activity     Assist  Wheelchair 150 feet activity did not occur: Safety/medical concerns   Assist Level: Dependent - Patient 0%   Blood pressure 138/84, pulse 83, temperature 98 F (36.7 C), temperature source Oral, resp. rate 15, height 5\' 1"  (1.549 m), weight 47.9 kg, SpO2 99%.  Medical Problem List and Plan: 1. 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 Extubated 11/05/2022 Fluctuating level of alertness             -patient may  shower  Elbow splint and PRAFOs ordered             -ELOS/Goals: min/modA but prognosis is guarded due to severe cognitive deficits goals 28-30d  -Continue CIR therapies including PT, OT, and SLP    Tasia Catchings bed ordered  Mother updated  Palliative care consulted, discussed home with hospice but patient is not a candidate  MRI brain ordered given decline in speech-->no acute abnormality evident            Observed therapy, discussed disposition with SW, applying for nursing home placement  -discussed with PT and SW applying  for nursing homes  -obtaining guardianship discussed with mother, form signed for her  Team conference 7/24  2.  Impaired mobility: continue Lovenox             -antiplatelet therapy: N/A 3. Pain Management: Tylenol as needed 4. Mood/Behavior/Sleep: Provide emotional support             -antipsychotic agents: N/A  5. Neuropsych/cognition: This patient is not capable of making decisions on her own behalf. 6. Left buttock unstageable ulcer.  Continue Medihoney to buttock wound daily cover with foam dressing.  Changing dressing every 3 days or as needed soiling   - hydrocortisone cream to scalp for rash 7/20  7. Fluids/Electrolytes/Nutrition: Routine in and outs with follow-up chemistries  -Labs 7/21 stable.  -Appears to have good p.o. intake  8.  Cavitary right lower lobe pneumonia likely aspiration pneumonia/MRSA pneumonia.  Continue course of Zyvox as indicated per infectious disease  -Labs 7/21 stable.  9.  History of drug abuse.  Positive cocaine on urine drug screen.  Provide counseling  10.  AKI/hypovolemia and ATN.  Renal function much improved.  Repeat Cr today.  -7/27 Cr stable 0.52 yesterday  -7/28 recheck BMP tomorrow    Latest Ref Rng & Units 12/15/2022    7:12 AM 12/11/2022    7:15 AM 12/08/2022    6:21 AM  BMP  Glucose 70  - 99 mg/dL  119    BUN 6 - 20 mg/dL  13    Creatinine 1.47 - 1.00 mg/dL 8.29  5.62  1.30   Sodium 135 - 145 mmol/L  138    Potassium 3.5 - 5.1 mmol/L  3.5    Chloride 98 - 111 mmol/L  102    CO2 22 - 32 mmol/L  24    Calcium 8.9 - 10.3 mg/dL  9.2      11.  Mild transaminitis with rhabdomyolysis.  CK reviewed and normalized  12.  Hypotension. Resolved, d/c midodrine  13. Impaired initiation: continue amantadine back to 100mg  BID  14. Suboptimal vitamin D: continue 1,000U D3 daily  15. Spasticity:   -Remains extremely tight, decrease baclofen to 15mg  TID due to fatigue. Appears to have increased sedation with this increase, continue to monitor. Continue wrist, elbow brace, and PRAFOs. Add tizanidine 2mg  HS  16. Bowel and bladder incontinence: continue bowel and bladder program   17. Left hand skin maceration: Eucerin ordered, discussed hand split with OT, conitnue  18. Tachycardia: decreased amantadine back to 100mg  BID, continue this dose  19. HTN: decrease amantadine to 100mg  BID. BP reviewed and has stabilized, continue this dose. Increase tizanidine HS to 4mg . BP reviewed and improved with this change, continue this dose     12/15/2022    5:16 AM 12/14/2022    8:32 PM 12/14/2022    3:17 PM  Vitals with BMI  Systolic 138 127 865  Diastolic 84 81 72  Pulse 83 78 93    20. Area of redness between left thumb and index finger: continue foam dressing  21. Scalp eczema: selsun blue ordered, continue  22. Fatigue: decrease baclofen to 15mg  TID, continue this dose  LOS: 27 days A FACE TO FACE EVALUATION WAS PERFORMED  Drema Pry  12/15/2022, 10:32 AM

## 2022-12-15 NOTE — Progress Notes (Signed)
Occupational Therapy Session Note  Patient Details  Name: Nicole [name] MRN: [id number] Date of Birth: Nov 21, 1975  Today's Date: 12/15/2022 OT Individual Time: 1101-1200 OT Individual Time Calculation (min): 59 min    Short Term Goals: Week 4:  OT Short Term Goal 1 (Week 4): Patient will sit unsupported at edge of bed x 15 seconds in preparation for level surface transfer OT Short Term Goal 2 (Week 4): Patient will transition from sit to stand and stand to sit as needed for lower body dressing with max assist OT Short Term Goal 3 (Week 4): Patient will turn head/ eyes toward auditory stimulation either right or left 25%/ time to aide in focused attention OT Short Term Goal 4 (Week 4): Patient will participate following hand over hand assistance in at least one familiar functional task x 5-10 seconds.  Skilled Therapeutic Interventions/Progress Updates:    Patient received seated in wheelchair and dressed.  Transported to main gym to address functional stand balance, and sit to stand transitions.  Working to improve postural control, forward controlled weight shift upright head tolerance.  Patient with less resistance to passive alignment to head and neck this session.  Very alert upon arrival and consistently turning head toward sound.  In parallel bars - worked to place BUE on parallel bar to reduce pull and weight of upper trunk toward flexion/ to promote improved trunk extension.  Assisted patient to stand - max assist, with emphasis on supporting LLE into extended position.  At times needed to support RLE into extension.  Worked on extending trunk over extended legs, and also slow controlled descent back to straight chair.  Patient returned to room at end of session.  Bilateral resting hand splints applied to assess fit and tolerance.  Patient left up in wheelchair with soft collar on neck, lap tray on wheelchair, elbow brace on RUE, and hand splints bilaterally.  Safety belt in place and engaged.   Call bell on lap tray.    Therapy Documentation Precautions:  Precautions Precautions: Fall Precaution Comments: Contact precautions & delayed processing Restrictions Weight Bearing Restrictions: No   Pain:  Winces with passive range to BUE        Therapy/Group: Individual Therapy  [name] 12/15/2022, 12:45 PM

## 2022-12-15 NOTE — Progress Notes (Signed)
Patient ID: Nicole [name], female   DOB: 1975/10/06, 47 y.o.   MRN: [id number]  Met with Mom yesterday to discuss team conference update-progress this past week. Mom to meet with [facility] liaison today and is completing the long term care medicaid application. Will need to await response from [facility] regarding what they want to do regarding payment since pt's SSD/SSI is pending and she does not have insurance to cover SNF. Will follow up with Mom and [facility] after meeting today.

## 2022-12-16 ENCOUNTER — Inpatient Hospital Stay (HOSPITAL_COMMUNITY): Payer: MEDICAID

## 2022-12-16 MED ORDER — BACLOFEN 5 MG HALF TABLET
15.0000 mg | ORAL_TABLET | Freq: Three times a day (TID) | ORAL | Status: DC
  Administered 2022-12-16 – 2022-12-25 (×28): 15 mg via ORAL
  Filled 2022-12-16 (×28): qty 1

## 2022-12-16 NOTE — Progress Notes (Signed)
Occupational Therapy Session Note  Patient Details  Name: Nicole [name] MRN: [id number] Date of Birth: 1975/11/03  Today's Date: 12/16/2022 OT Individual Time: 4098-1191 OT Individual Time Calculation (min): 73 min    Short Term Goals: Week 4:  OT Short Term Goal 1 (Week 4): Patient will sit unsupported at edge of bed x 15 seconds in preparation for level surface transfer OT Short Term Goal 2 (Week 4): Patient will transition from sit to stand and stand to sit as needed for lower body dressing with max assist OT Short Term Goal 3 (Week 4): Patient will turn head/ eyes toward auditory stimulation either right or left 25%/ time to aide in focused attention OT Short Term Goal 4 (Week 4): Patient will participate following hand over hand assistance in at least one familiar functional task x 5-10 seconds.  Skilled Therapeutic Interventions/Progress Updates:    Patient received supine in bed, awake and turning head to sound.  Removed resting hand splints bilaterally, removed elbow brace from right elbow.  Noted patient's right wrist hand very swollen.  Left hand mildly swollen - but this resolved fairly quickly.  Notified MD and team that braces left off Ue's for now to give arms a rest.  MD planning Xray.   Patient's brief changed - soaked and soiled.  Patient assisted to rolling shower chair and transported to bathroom to wash hair. Patient had several large formed stools during thi session.  Patient immediately placed right hand in hair and assisted with washing once water turned on.  Transferred patient back to bed to apply new brief.  Noted black scar on base of small toe on R foot.  Patient assisted to don pants supine with total assist - worked on tolerance for rolling almost fully to prone.   Transferred to sitting then to wheelchair with total assist.  Assisted patient to don shirt and brush hair with total assist.  Patient eyes open entire session.   Patient left up in wheelchair with soft neck  collar, lap tray, right hand mitt, left hand palm protector and reclined.  Safety belt in place and engaged and call bell on tray.    Therapy Documentation Precautions:  Precautions Precautions: Fall Precaution Comments: Contact precautions & delayed processing Restrictions Weight Bearing Restrictions: No   Pain: Pain Assessment Pain Scale: Faces Pain Score:Winces with range to BUE, Right wrist/ hand swollen      Therapy/Group: Individual Therapy  [name] 12/16/2022, 12:09 PM

## 2022-12-16 NOTE — Progress Notes (Signed)
PROGRESS NOTE   Subjective/Complaints: Right wrist swollen, XR ordered and shows no fracture, there is 1.18mm ulnar positive variance, likely secondary to patient's spasticity  ROS: Unable to obtain due to cognitive/behavioral, +swelling of right wrist  Objective:   DG Wrist 2 Views Right  Result Date: 12/16/2022 CLINICAL DATA:  Right wrist pain swelling. EXAM: RIGHT WRIST - 2 VIEW COMPARISON:  None Available. FINDINGS: There is 1.5 mm ulnar positive variance. Joint spaces are preserved. No acute fracture or dislocation. There is a linear 2 x less than 1 mm density overlying the proximal shaft of the third metacarpal on frontal view and seen overlying the more volar soft tissues on oblique view. This is not well visualized on lateral view, and could represent a similar density seen within the overlying sheet/possible bandage material outside the patient, volar to a similar region of the proximal metacarpals on lateral view. IMPRESSION: 1. No acute fracture or dislocation. 2. A small linear density overlying the region of the proximal metacarpals on some views is favored to lie outside the patient on lateral view. Electronically Signed   By: Neita Garnet M.D.   On: 12/16/2022 16:04   No results for input(s): "WBC", "HGB", "HCT", "PLT" in the last 72 hours.    Recent Labs    12/15/22 8119  CREATININE 0.52       Intake/Output Summary (Last 24 hours) at 12/16/2022 1635 Last data filed at 12/16/2022 1237 Gross per 24 hour  Intake 756 ml  Output --  Net 756 ml      Pressure Injury 10/31/22 Buttocks Left Unstageable - Full thickness tissue loss in which the base of the injury is covered by slough (yellow, tan, gray, green or brown) and/or eschar (tan, brown or black) in the wound bed. (Active)  10/31/22 2000  Location: Buttocks  Location Orientation: Left  Staging: Unstageable - Full thickness tissue loss in which the base of the  injury is covered by slough (yellow, tan, gray, green or brown) and/or eschar (tan, brown or black) in the wound bed.  Wound Description (Comments):   Present on Admission: Yes     Pressure Injury 11/18/22 Toe (Comment  which one) Anterior;Right Deep Tissue Pressure Injury - Purple or maroon localized area of discolored intact skin or blood-filled blister due to damage of underlying soft tissue from pressure and/or shear. small are (Active)  11/18/22 1300  Location: Toe (Comment  which one)  Location Orientation: Anterior;Right  Staging: Deep Tissue Pressure Injury - Purple or maroon localized area of discolored intact skin or blood-filled blister due to damage of underlying soft tissue from pressure and/or shear.  Wound Description (Comments): small area on right great toe metatarsal  Present on Admission: Yes    Physical Exam: Vital Signs Blood pressure 137/89, pulse 74, temperature 98 F (36.7 C), temperature source Oral, resp. rate 17, height 5\' 1"  (1.549 m), weight 47.9 kg, SpO2 100%.  Gen: no distress, lying in bed, appears comfortable, BMI 199.95 HEENT: oral mucosa pink and moist, NCAT.  Scaling rash on posterior scalp. Cardio: RRR Chest: CTAB, normal effort, normal rate of breathing Abd: soft, NT, non-distended, positive bowel sounds Ext: no edema Psych: Flat  appearing affect Skin: Macerated left hand - healing + Scaling dried skin on head - stable, stage 2 to right toe, area of redness between left thumb and index finger, left buttock unstageable  Neurologic: Does not respond to orientation questions, does not track, does not follow cues. Opens eyes to stimuli Tone: MAS 3 right elbow, right knee, and right hip; MAS 3 L elbow otherwise resists ROM Cerebellar exam unable to assess  Musculoskeletal: reduced bilateral shoulder and elbow ROM due to increased tone  Elbow brace in place Strong neck flexion Right wrist swelling   Assessment/Plan: 1. Functional deficits which  require 3+ hours per day of interdisciplinary therapy in a comprehensive inpatient rehab setting. Physiatrist is providing close team supervision and 24 hour management of active medical problems listed below. Physiatrist and rehab team continue to assess barriers to discharge/monitor patient progress toward functional and medical goals  Care Tool:  Bathing    Body parts bathed by patient: Chest, Face   Body parts bathed by helper: Right arm, Left arm, Chest, Abdomen, Front perineal area, Buttocks, Right upper leg, Left upper leg, Right lower leg, Left lower leg, Face Body parts n/a: Right arm, Right upper leg, Left arm, Left upper leg, Chest, Abdomen, Right lower leg, Front perineal area, Left lower leg, Buttocks, Face   Bathing assist Assist Level: Dependent - Patient 0%     Upper Body Dressing/Undressing Upper body dressing   What is the patient wearing?: Hospital gown only    Upper body assist Assist Level: Dependent - Patient 0%    Lower Body Dressing/Undressing Lower body dressing      What is the patient wearing?: Incontinence brief     Lower body assist Assist for lower body dressing: Dependent - Patient 0%     Toileting Toileting Toileting Activity did not occur (Clothing management and hygiene only): N/A (no void or bm)  Toileting assist Assist for toileting: Dependent - Patient 0%     Transfers Chair/bed transfer  Transfers assist  Chair/bed transfer activity did not occur: Safety/medical concerns  Chair/bed transfer assist level: Maximal Assistance - Patient 25 - 49%     Locomotion Ambulation   Ambulation assist   Ambulation activity did not occur: Safety/medical concerns          Walk 10 feet activity   Assist  Walk 10 feet activity did not occur: Safety/medical concerns        Walk 50 feet activity   Assist Walk 50 feet with 2 turns activity did not occur: Safety/medical concerns         Walk 150 feet activity   Assist Walk  150 feet activity did not occur: Safety/medical concerns         Walk 10 feet on uneven surface  activity   Assist Walk 10 feet on uneven surfaces activity did not occur: Safety/medical concerns         Wheelchair     Assist Is the patient using a wheelchair?: Yes Type of Wheelchair: Manual (tilt in space)    Wheelchair assist level: Dependent - Patient 0%      Wheelchair 50 feet with 2 turns activity    Assist    Wheelchair 50 feet with 2 turns activity did not occur: Safety/medical concerns   Assist Level: Dependent - Patient 0%   Wheelchair 150 feet activity     Assist  Wheelchair 150 feet activity did not occur: Safety/medical concerns   Assist Level: Dependent - Patient 0%   Blood  pressure 137/89, pulse 74, temperature 98 F (36.7 C), temperature source Oral, resp. rate 17, height 5\' 1"  (1.549 m), weight 47.9 kg, SpO2 100%.  Medical Problem List and Plan: 1. 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 Extubated 11/05/2022 Fluctuating level of alertness             -patient may  shower  Elbow splint and PRAFOs ordered             -ELOS/Goals: min/modA but prognosis is guarded due to severe cognitive deficits goals 28-30d  -Continue CIR therapies including PT, OT, and SLP    Tasia Catchings bed ordered  Mother updated  Palliative care consulted, discussed home with hospice but patient is not a candidate  MRI brain ordered given decline in speech-->no acute abnormality evident            Observed therapy, discussed disposition with SW, applying for nursing home placement  -discussed with PT and SW applying for nursing homes  -obtaining guardianship discussed with mother, form signed for her  Team conference 7/24  2.  Impaired mobility: continue Lovenox             -antiplatelet therapy: N/A 3. Pain Management: Tylenol as needed 4. Mood/Behavior/Sleep: Provide emotional support              -antipsychotic agents: N/A  5. Neuropsych/cognition: This patient is not capable of making decisions on her own behalf. 6. Left buttock unstageable ulcer.  Continue Medihoney to buttock wound daily cover with foam dressing.  Changing dressing every 3 days or as needed soiling   - hydrocortisone cream to scalp for rash 7/20  7. Fluids/Electrolytes/Nutrition: Routine in and outs with follow-up chemistries  -Labs 7/21 stable.  -Appears to have good p.o. intake  8.  Cavitary right lower lobe pneumonia likely aspiration pneumonia/MRSA pneumonia.  Continue course of Zyvox as indicated per infectious disease  -Labs 7/21 stable.  9.  History of drug abuse.  Positive cocaine on urine drug screen.  Provide counseling  10.  AKI/hypovolemia and ATN.  Renal function much improved.  Repeat Cr today.  -7/27 Cr stable 0.52 yesterday  -7/28 recheck BMP tomorrow    Latest Ref Rng & Units 12/15/2022    7:12 AM 12/11/2022    7:15 AM 12/08/2022    6:21 AM  BMP  Glucose 70 - 99 mg/dL  782    BUN 6 - 20 mg/dL  13    Creatinine 9.56 - 1.00 mg/dL 2.13  0.86  5.78   Sodium 135 - 145 mmol/L  138    Potassium 3.5 - 5.1 mmol/L  3.5    Chloride 98 - 111 mmol/L  102    CO2 22 - 32 mmol/L  24    Calcium 8.9 - 10.3 mg/dL  9.2      11.  Mild transaminitis with rhabdomyolysis.  CK reviewed and normalized  12.  Hypotension. Resolved, d/c midodrine  13. Impaired initiation: continue amantadine back to 100mg  BID  14. Suboptimal vitamin D: continue 1,000U D3 daily  15. Spasticity:   -Remains extremely tight, decrease baclofen to 15mg  TID due to fatigue. Appears to have increased sedation with this increase, continue to monitor. Continue wrist, elbow brace, and PRAFOs. Add tizanidine 2mg  HS  16. Bowel and bladder incontinence: continue bowel and bladder program   17. Left hand skin maceration: Eucerin ordered, discussed hand split with OT, conitnue  18. Tachycardia: decreased amantadine back to 100mg  BID,  continue this dose  19. HTN: decrease amantadine to 100mg  BID. BP reviewed and has stabilized, continue this dose. Increase tizanidine HS to 4mg . BP reviewed and improved with this change, continue this dose     12/16/2022    3:34 PM 12/16/2022    5:34 AM 12/15/2022    7:33 PM  Vitals with BMI  Systolic 137 138 259  Diastolic 89 88 96  Pulse 74 84 89    20. Area of redness between left thumb and index finger: continue foam dressing  21. Scalp eczema: selsun blue ordered, continue  22. Fatigue: improved, continue baclofen 15mg  TID  23. Right wrist swelling: XR ordered and shows no acute fracture, shows 2.21mm ulnar variance  LOS: 28 days A FACE TO FACE EVALUATION WAS PERFORMED  Drema Pry  12/16/2022, 4:35 PM

## 2022-12-16 NOTE — Progress Notes (Signed)
Physical Therapy Session Note  Patient Details  Name: Nicole [name] MRN: [id number] Date of Birth: 1975-10-25  Today's Date: 12/16/2022 PT Individual Time: 0347-4259 PT Individual Time Calculation (min): 40 min   Short Term Goals: Week 1:  PT Short Term Goal 1 (Week 1): pt will perform sit<>stand with +2 max A PT Short Term Goal 1 - Progress (Week 1): Not met PT Short Term Goal 2 (Week 1): pt will initiate gait training PT Short Term Goal 2 - Progress (Week 1): Not met PT Short Term Goal 3 (Week 1): Pt will perform bed<>chair transfer with +2 max A PT Short Term Goal 3 - Progress (Week 1): Not met  Skilled Therapeutic Interventions/Progress Updates:  Pt was seen bedside in the pm sitting up in tilt in space w/c. Attempted to sit and edge of w/c and work on sitting balance. Pt required total A to maintain balance on edge of w/c, no righting reactions or protective reactions noted. Performed PROM B lower extremities, 2 sets x 10 reps each, hip flex and LAQs. Attempted sit to stand transfers sitting in w/c x 2, no attempt to initiate or assist with sit to stand. Pt transferred w/c to edge of bed with max A and verbal cues, pt appeared to assist with lower extremities during transfers. Pt transferred edge of bed to supine with total A. Pt moved up in bed dependently. Pt left sitting up in bed with all needs within reach.   Therapy Documentation Precautions:  Precautions Precautions: Fall Precaution Comments: Contact precautions & delayed processing Restrictions Weight Bearing Restrictions: No General:   Pain: No signs of pain   Therapy/Group: Individual Therapy  Rayford Halsted 12/16/2022, 4:13 PM

## 2022-12-16 NOTE — Progress Notes (Signed)
Speech Language Pathology Daily Session Note  Patient Details  Name: Nicole [name] MRN: [id number] Date of Birth: 1976-02-04  Today's Date: 12/16/2022 SLP Individual Time: 4098-1191 SLP Individual Time Calculation (min): 30 min  Short Term Goals: Week 4: SLP Short Term Goal 1 (Week 4): Pt will present w/ appropriate response to functional stimuli (i.e. accept spoon, grasp basic item appropriately, response to tactile stimuli) 15% of the time w/ maxA multimodal cues SLP Short Term Goal 2 (Week 4): Patient will follow 1-step commands within a context in 10% of opportunities with Max A multimodal cues. SLP Short Term Goal 3 (Week 4): Patient will utilize multimodal communication to answer basic yes/no questions with Max A multimodal cues in 10% of opportunities  Skilled Therapeutic Interventions:  Pt sitting upright in wc upon SLP arrival. Initially her eyes were open but she did not turn towards her name when SLP entered. She then became somnolent, verbal cues were unsuccessful at waking, and she required min tactile stimuli with ice chips to her lips to wake and remained alert during oral care. She was noted to open her mouth to ice chips via spoon and suction toothbrush. Biting of toothbrush noted after approximately 10-15 seconds. Pt answered y/n questions related to her pain, name, or wishes with 0% acc despite cues for head nod, vocalizing, or using low-tech yes/no AAC board when provided max verbal cueing. Pt followed simple 1-step commands with 0% acc when provided max verbal and tactile cueing from SLP. X-ray technician arrived to x-ray her R wrist and she followed 1-step commands with 0% acc and became too somnolent for him to complete the task. Session discontinued because of poor participation due to somnolence.   Pt left in wc with call bell within reach and chair alarm activated.   Pain Pain Assessment Pain Scale: Faces Pain Score: 0-No pain Faces Pain Scale: No hurt  Therapy/Group:  Individual Therapy  Alphonsus Sias 12/16/2022, 12:57 PM

## 2022-12-16 NOTE — Plan of Care (Signed)
  Problem: Consults Goal: RH STROKE PATIENT EDUCATION Description: See Patient Education module for education specifics  Outcome: Progressing   Problem: RH BOWEL ELIMINATION Goal: RH STG MANAGE BOWEL WITH ASSISTANCE Description: STG Manage Bowel with mod I Assistance. Outcome: Progressing   Problem: RH BLADDER ELIMINATION Goal: RH STG MANAGE BLADDER WITH ASSISTANCE Description: STG Manage Bladder With toileting Assistance Outcome: Progressing   Problem: RH SKIN INTEGRITY Goal: RH STG MAINTAIN SKIN INTEGRITY WITH ASSISTANCE Description: STG Maintain Skin Integrity With min  Assistance. Outcome: Progressing

## 2022-12-16 NOTE — Progress Notes (Signed)
Physical Therapy Session Note  Patient Details  Name: Nicole [name] MRN: [id number] Date of Birth: 08-May-1976  Today's Date: 12/16/2022 PT Individual Time: 1450-1531 PT Individual Time Calculation (min): 41 min   Short Term Goals: Week 1:  PT Short Term Goal 1 (Week 1): pt will perform sit<>stand with +2 max A PT Short Term Goal 1 - Progress (Week 1): Not met PT Short Term Goal 2 (Week 1): pt will initiate gait training PT Short Term Goal 2 - Progress (Week 1): Not met PT Short Term Goal 3 (Week 1): Pt will perform bed<>chair transfer with +2 max A PT Short Term Goal 3 - Progress (Week 1): Not met  Skilled Therapeutic Interventions/Progress Updates:  Pt was seen bedside in the pm. Pt transferred supine to edge of bed, edge of bed to supine with total A to dependent. Pt tolerated edge of bed about 15 minutes with total Attempted to work on [employer], weight shifting and midline posture, no righting or protective reactions noted. Pt returned to supine. Pt rolled R/L with total A to dependent. Assisted nurse tech with changing brief and clothes. Pt left sitting up in bed with nurse tech at bedside.   Therapy Documentation Precautions:  Precautions Precautions: Fall Precaution Comments: Contact precautions & delayed processing Restrictions Weight Bearing Restrictions: No General:   Pain: No signs of pain.     Therapy/Group: Individual Therapy  Rayford Halsted 12/16/2022, 4:20 PM

## 2022-12-17 MED ORDER — TIZANIDINE HCL 4 MG PO TABS
6.0000 mg | ORAL_TABLET | Freq: Every day | ORAL | Status: DC
  Administered 2022-12-17 – 2022-12-26 (×10): 6 mg via ORAL
  Filled 2022-12-17 (×10): qty 2

## 2022-12-17 NOTE — Progress Notes (Signed)
Physical Therapy Session Note  Patient Details  Name: Nicole [name] MRN: [id number] Date of Birth: 11/06/1975  Today's Date: 12/17/2022 PT Individual Time: 1120-1200 and 1450-1530 PT Individual Time Calculation (min): 40 min and 40 min  Short Term Goals: Week 4:  PT Short Term Goal 1 (Week 4): Pt will initiate bed mobility but require no more than totalA. PT Short Term Goal 2 (Week 4): Pt will maintain sitting balance unsupported for 30 seconds PT Short Term Goal 3 (Week 4): Pt will demonstrate command follow for ~15% of tasks.  Skilled Therapeutic Interventions/Progress Updates: Tx1: Pt presented in TIS in NAD. Session focused on heel cord stretch, core activation, and task initiation. Pt transported to day room PTA providing stretching to LUE to allow for easier application of splint at elbow. Once completed pt set up in standing frame. Pt tolerating x 2 bouts of standing in standing frame for approx 5 min each. While in standing frame PTA facilitating lateral weight shifting as well as repositioning of feet to allow for improved heel cord stretch. After first bout pt was able to maintain L foot on ground and pt's R foot was 90% flat on ground. PTA also providing stretching/facilitation at shoulders/chest for improved upright posture due to pt's baseline being significantly forward flexed. PTA also providing gentle Cx stretching for improved neutral alignment. Pt was able to tolerate stretching without noted significant grimacing. On second standing focused more on following commands "turn head towards me", "open hand", "squeeze hand". Pt was able to initiate opening hand and turning head, but unable to squeeze hand. At end of session pt returned to [facility] and transported back to room. Pt set up with full lap tray, alarm belt on, and pt in NAD.   Tx2: Pt presented in TIS in NAD. Pt transported to day room for time management. PTA performed scooting anteriorly with total A. PTA completed squat pivot  transfer to mat total A. Upon completing transfer pt noted to be incontinent of urine and soiled through pants. PTA completed transfer back to TIS in same manner. Prior to transferring back to TIS, noted that pt was able to maintain sitting balance unsupported for >30 seconds. Once transfer completed pt taken back to room and pt completed stand pivot transfer total A. Pt completed dependent rolling to allow for peri-care and donning clean dry brief. Once completed PTA performed gentle stretching/ROM to BUE with PTA. Pt noted to only demonstrate grimacing when performing ROM to L hand and when reaching limit of elbow extension (pt unable to achieve full extension). Pt left in bed at end of session with HOB elevated and pt in NAD.      Therapy Documentation Precautions:  Precautions Precautions: Fall Precaution Comments: Contact precautions & delayed processing Restrictions Weight Bearing Restrictions: No General:   Vital Signs: Therapy Vitals Temp: 98.3 F (36.8 C) Temp Source: Oral Pulse Rate: 90 Resp: 17 BP: (!) 156/99 Patient Position (if appropriate): Lying Oxygen Therapy SpO2: 100 % O2 Device: Room Air Pain: Pain Assessment Pain Scale: 0-10 Pain Score: 0-No pain Mobility:   Locomotion :    Trunk/Postural Assessment :    Balance:   Exercises:   Other Treatments:      Therapy/Group: Individual Therapy    12/17/2022, 4:17 PM

## 2022-12-17 NOTE — Progress Notes (Signed)
PROGRESS NOTE   Subjective/Complaints: Tolerated therapy today Appears comfortable BP elevated today  ROS: Unable to obtain due to cognitive/behavioral, +swelling of right wrist, +decreased initiation  Objective:   DG Wrist 2 Views Right  Result Date: 12/16/2022 CLINICAL DATA:  Right wrist pain swelling. EXAM: RIGHT WRIST - 2 VIEW COMPARISON:  None Available. FINDINGS: There is 1.5 mm ulnar positive variance. Joint spaces are preserved. No acute fracture or dislocation. There is a linear 2 x less than 1 mm density overlying the proximal shaft of the third metacarpal on frontal view and seen overlying the more volar soft tissues on oblique view. This is not well visualized on lateral view, and could represent a similar density seen within the overlying sheet/possible bandage material outside the patient, volar to a similar region of the proximal metacarpals on lateral view. IMPRESSION: 1. No acute fracture or dislocation. 2. A small linear density overlying the region of the proximal metacarpals on some views is favored to lie outside the patient on lateral view. Electronically Signed   By: Neita Garnet M.D.   On: 12/16/2022 16:04   No results for input(s): "WBC", "HGB", "HCT", "PLT" in the last 72 hours.    Recent Labs    12/15/22 1610  CREATININE 0.52       Intake/Output Summary (Last 24 hours) at 12/17/2022 1627 Last data filed at 12/17/2022 1251 Gross per 24 hour  Intake 1069 ml  Output --  Net 1069 ml      Pressure Injury 10/31/22 Buttocks Left Unstageable - Full thickness tissue loss in which the base of the injury is covered by slough (yellow, tan, gray, green or brown) and/or eschar (tan, brown or black) in the wound bed. (Active)  10/31/22 2000  Location: Buttocks  Location Orientation: Left  Staging: Unstageable - Full thickness tissue loss in which the base of the injury is covered by slough (yellow, tan, gray,  green or brown) and/or eschar (tan, brown or black) in the wound bed.  Wound Description (Comments):   Present on Admission: Yes     Pressure Injury 11/18/22 Toe (Comment  which one) Anterior;Right Deep Tissue Pressure Injury - Purple or maroon localized area of discolored intact skin or blood-filled blister due to damage of underlying soft tissue from pressure and/or shear. small are (Active)  11/18/22 1300  Location: Toe (Comment  which one)  Location Orientation: Anterior;Right  Staging: Deep Tissue Pressure Injury - Purple or maroon localized area of discolored intact skin or blood-filled blister due to damage of underlying soft tissue from pressure and/or shear.  Wound Description (Comments): small area on right great toe metatarsal  Present on Admission: Yes    Physical Exam: Vital Signs Blood pressure (!) 156/99, pulse 90, temperature 98.3 F (36.8 C), temperature source Oral, resp. rate 17, height 5\' 1"  (1.549 m), weight 47.9 kg, SpO2 100%.  Gen: no distress, lying in bed, appears comfortable, BMI 19.95 HEENT: oral mucosa pink and moist, NCAT.  Scaling rash on posterior scalp. Cardio: RRR Chest: CTAB, normal effort, normal rate of breathing Abd: soft, NT, non-distended, positive bowel sounds Ext: no edema Psych: Flat appearing affect Skin: Macerated left hand - healing +  Scaling dried skin on head - stable, stage 2 to right toe, area of redness between left thumb and index finger, left buttock unstageable  Neurologic: Does not respond to orientation questions, does not track, does not follow cues. Opens eyes to stimuli Tone: MAS 3 right elbow, right knee, and right hip; MAS 3 L elbow otherwise resists ROM Cerebellar exam unable to assess  Musculoskeletal: reduced bilateral shoulder and elbow ROM due to increased tone  Elbow brace in place Strong neck flexion Right wrist swelling   Assessment/Plan: 1. Functional deficits which require 3+ hours per day of interdisciplinary  therapy in a comprehensive inpatient rehab setting. Physiatrist is providing close team supervision and 24 hour management of active medical problems listed below. Physiatrist and rehab team continue to assess barriers to discharge/monitor patient progress toward functional and medical goals  Care Tool:  Bathing    Body parts bathed by patient: Chest, Face   Body parts bathed by helper: Right arm, Left arm, Chest, Abdomen, Front perineal area, Buttocks, Right upper leg, Left upper leg, Right lower leg, Left lower leg, Face Body parts n/a: Right arm, Right upper leg, Left arm, Left upper leg, Chest, Abdomen, Right lower leg, Front perineal area, Left lower leg, Buttocks, Face   Bathing assist Assist Level: Dependent - Patient 0%     Upper Body Dressing/Undressing Upper body dressing   What is the patient wearing?: Hospital gown only    Upper body assist Assist Level: Dependent - Patient 0%    Lower Body Dressing/Undressing Lower body dressing      What is the patient wearing?: Incontinence brief     Lower body assist Assist for lower body dressing: Dependent - Patient 0%     Toileting Toileting Toileting Activity did not occur (Clothing management and hygiene only): N/A (no void or bm)  Toileting assist Assist for toileting: Dependent - Patient 0%     Transfers Chair/bed transfer  Transfers assist  Chair/bed transfer activity did not occur: Safety/medical concerns  Chair/bed transfer assist level: Maximal Assistance - Patient 25 - 49%     Locomotion Ambulation   Ambulation assist   Ambulation activity did not occur: Safety/medical concerns          Walk 10 feet activity   Assist  Walk 10 feet activity did not occur: Safety/medical concerns        Walk 50 feet activity   Assist Walk 50 feet with 2 turns activity did not occur: Safety/medical concerns         Walk 150 feet activity   Assist Walk 150 feet activity did not occur:  Safety/medical concerns         Walk 10 feet on uneven surface  activity   Assist Walk 10 feet on uneven surfaces activity did not occur: Safety/medical concerns         Wheelchair     Assist Is the patient using a wheelchair?: Yes Type of Wheelchair: Manual (tilt in space)    Wheelchair assist level: Dependent - Patient 0%      Wheelchair 50 feet with 2 turns activity    Assist    Wheelchair 50 feet with 2 turns activity did not occur: Safety/medical concerns   Assist Level: Dependent - Patient 0%   Wheelchair 150 feet activity     Assist  Wheelchair 150 feet activity did not occur: Safety/medical concerns   Assist Level: Dependent - Patient 0%   Blood pressure (!) 156/99, pulse 90, temperature 98.3 F (36.8  C), temperature source Oral, resp. rate 17, height 5\' 1"  (1.549 m), weight 47.9 kg, SpO2 100%.  Medical Problem List and Plan: 1. 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 Extubated 11/05/2022 Fluctuating level of alertness             -patient may  shower  Elbow splint and PRAFOs ordered             -ELOS/Goals: min/modA but prognosis is guarded due to severe cognitive deficits goals 28-30d  -Continue CIR therapies including PT, OT, and SLP    Tasia Catchings bed ordered  Mother updated  Palliative care consulted, discussed home with hospice but patient is not a candidate  MRI brain ordered given decline in speech-->no acute abnormality evident            Observed therapy, discussed disposition with SW, applying for nursing home placement  -discussed with PT and SW applying for nursing homes  -obtaining guardianship discussed with mother, form signed for her  Team conference 7/24  2.  Impaired mobility: continue Lovenox             -antiplatelet therapy: N/A 3. Pain Management: Tylenol as needed 4. Mood/Behavior/Sleep: Provide emotional support             -antipsychotic agents:  N/A  5. Neuropsych/cognition: This patient is not capable of making decisions on her own behalf. 6. Left buttock unstageable ulcer.  Continue Medihoney to buttock wound daily cover with foam dressing.  Changing dressing every 3 days or as needed soiling   - hydrocortisone cream to scalp for rash 7/20  7. Fluids/Electrolytes/Nutrition: Routine in and outs with follow-up chemistries  -Labs 7/21 stable.  -Appears to have good p.o. intake  8.  Cavitary right lower lobe pneumonia likely aspiration pneumonia/MRSA pneumonia.  Continue course of Zyvox as indicated per infectious disease  -Labs 7/21 stable.  9.  History of drug abuse.  Positive cocaine on urine drug screen.  Provide counseling  10.  AKI/hypovolemia and ATN.  Renal function much improved.  Repeat Cr today.  -7/27 Cr stable 0.52 yesterday  -7/28 recheck BMP tomorrow    Latest Ref Rng & Units 12/15/2022    7:12 AM 12/11/2022    7:15 AM 12/08/2022    6:21 AM  BMP  Glucose 70 - 99 mg/dL  161    BUN 6 - 20 mg/dL  13    Creatinine 0.96 - 1.00 mg/dL 0.45  4.09  8.11   Sodium 135 - 145 mmol/L  138    Potassium 3.5 - 5.1 mmol/L  3.5    Chloride 98 - 111 mmol/L  102    CO2 22 - 32 mmol/L  24    Calcium 8.9 - 10.3 mg/dL  9.2      11.  Mild transaminitis with rhabdomyolysis.  CK reviewed and normalized  12.  Hypotension. Resolved, d/c midodrine  13. Impaired initiation: continue amantadine back to 100mg  BID  14. Suboptimal vitamin D: continue 1,000U D3 daily  15. Spasticity:   -Remains extremely tight, decrease baclofen to 15mg  TID due to fatigue. Appears to have increased sedation with this increase, continue to monitor. Continue wrist, elbow brace, and PRAFOs. Add tizanidine 2mg  HS  16. Bowel and bladder incontinence: continue bowel and bladder program   17. Left hand skin maceration: Eucerin ordered, discussed hand split with OT, conitnue  18. Tachycardia: decreased amantadine back to 100mg  BID, continue this dose  19.  HTN: decrease amantadine  to 100mg  BID. BP reviewed and has stabilized, continue this dose. Increase tizanidine HS to 6mg      12/17/2022    3:50 PM 12/17/2022    3:27 AM 12/16/2022    7:48 PM  Vitals with BMI  Systolic 156 125 161  Diastolic 99 83 86  Pulse 90 91 86    20. Area of redness between left thumb and index finger: continue foam dressing  21. Scalp eczema: selsun blue ordered, continue  22. Fatigue: improved, continue baclofen 15mg  TID, check B12 level tomorrow.   23. Right wrist swelling: XR ordered and shows no acute fracture, shows 2.50mm ulnar variance, brace removed to given patient a break from it.   LOS: 29 days A FACE TO FACE EVALUATION WAS PERFORMED  Drema Pry  12/17/2022, 4:27 PM

## 2022-12-17 NOTE — Progress Notes (Signed)
Speech Language Pathology Daily Session Note  Patient Details  Name: Nicole [name] MRN: [id number] Date of Birth: July 23, 1975  Today's Date: 12/17/2022 SLP Individual Time: 1350-1430 SLP Individual Time Calculation (min): 40 min  Short Term Goals: Week 4: SLP Short Term Goal 1 (Week 4): Pt will present w/ appropriate response to functional stimuli (i.e. accept spoon, grasp basic item appropriately, response to tactile stimuli) 15% of the time w/ maxA multimodal cues SLP Short Term Goal 2 (Week 4): Patient will follow 1-step commands within a context in 10% of opportunities with Max A multimodal cues. SLP Short Term Goal 3 (Week 4): Patient will utilize multimodal communication to answer basic yes/no questions with Max A multimodal cues in 10% of opportunities  Skilled Therapeutic Interventions:  Pt was seen for skilled ST targeting cognitive goals.  Upon arrival, pt was seated upright in wheelchair with mitt placed on right hand and elbow brace on left, chair alarm set.  Pt noted to be rubbing her lips against the seam of her safety mitt, lips noted to be dry.  Mitt was removed and pt's chapstick was placed in her hand, total assist.  Pt began more animatedly rubbing her lips back and forth against the chapstick.  Total assist needed to remove chapstick once pt began opening mouth wider, as if to eat chapstick.  Pt remained in very flexed position while in tilt in space wheelchair, did not make eye contact with therapist even when therapist moved lower into pt's visual field.  Pt completed oral care with total hand over hand assist.   No purposeful following of commands appreciated during today's appointment.  Pt was left in chair with mitt donned, elbow brace donned, and chair alarm set.  Continue per current plan of care.    Pain Pain Assessment Pain Scale: 0-10 Pain Score: 0-No pain  Therapy/Group: Individual Therapy  , Melanee Spry 12/17/2022, 2:33 PM

## 2022-12-17 NOTE — Plan of Care (Signed)
  Problem: Consults Goal: RH STROKE PATIENT EDUCATION Description: See Patient Education module for education specifics  Outcome: Not Applicable   Problem: RH BOWEL ELIMINATION Goal: RH STG MANAGE BOWEL WITH ASSISTANCE Description: STG Manage Bowel with mod I Assistance. Outcome: Not Applicable Goal: RH STG MANAGE BOWEL W/MEDICATION W/ASSISTANCE Description: STG Manage Bowel with Medication with mod I Assistance. Outcome: Not Applicable   Problem: RH BLADDER ELIMINATION Goal: RH STG MANAGE BLADDER WITH ASSISTANCE Description: STG Manage Bladder With toileting Assistance Outcome: Not Applicable   Problem: RH SKIN INTEGRITY Goal: RH STG SKIN FREE OF INFECTION/BREAKDOWN Description: Manage skin with min assist  Outcome: Not Applicable Goal: RH STG MAINTAIN SKIN INTEGRITY WITH ASSISTANCE Description: STG Maintain Skin Integrity With min  Assistance. Outcome: Not Applicable Goal: RH STG ABLE TO PERFORM INCISION/WOUND CARE W/ASSISTANCE Description: STG Able To Perform Incision/Wound Care With Assistance. Outcome: Not Applicable   Problem: RH SAFETY Goal: RH STG ADHERE TO SAFETY PRECAUTIONS W/ASSISTANCE/DEVICE Description: STG Adhere to Safety Precautions With cues Assistance/Device. Outcome: Not Applicable   Problem: RH COGNITION-NURSING Goal: RH STG USES MEMORY AIDS/STRATEGIES W/ASSIST TO PROBLEM SOLVE Description: STG Uses Memory Aids/Strategies With cues Assistance to Problem Solve. Outcome: Not Applicable   Problem: RH KNOWLEDGE DEFICIT Goal: RH STG INCREASE KNOWLEDGE OF DYSPHAGIA/FLUID INTAKE Description: Manage dysphagia with cues Outcome: Not Applicable Goal: RH STG INCREASE KNOWLEDGE OF STROKE PROPHYLAXIS Description: Patient and mother will be able to manage stroke risks with medications and dietary modification using educational resources independently Outcome: Not Applicable   Problem: RH KNOWLEDGE DEFICIT BRAIN INJURY Goal: RH STG INCREASE KNOWLEDGE OF SELF CARE  AFTER BRAIN INJURY Description: Patient and mother will be able to manage post brain injury care with medications and dietary modification using educational resources independently Outcome: Not Applicable   Problem: Consults Goal: RH BRAIN INJURY PATIENT EDUCATION Description: Description: See Patient Education module for eduction specifics Outcome: Not Applicable

## 2022-12-17 NOTE — Progress Notes (Signed)
Physical Therapy Session Note  Patient Details  Name: Nicole [name] MRN: [id number] Date of Birth: 06-11-1975  Today's Date: 12/17/2022 PT Individual Time: 8413-2440 PT Individual Time Calculation (min): 58 min   Short Term Goals: Week 1:  PT Short Term Goal 1 (Week 1): pt will perform sit<>stand with +2 max A PT Short Term Goal 1 - Progress (Week 1): Not met PT Short Term Goal 2 (Week 1): pt will initiate gait training PT Short Term Goal 2 - Progress (Week 1): Not met PT Short Term Goal 3 (Week 1): Pt will perform bed<>chair transfer with +2 max A PT Short Term Goal 3 - Progress (Week 1): Not met  Skilled Therapeutic Interventions/Progress Updates:  Pt was seen bedside in the am. Pt dependent for donning shirt and pants. Pt rolled R/L with side rails dependent. Pt transferred supine to edge of bed dependent. Pt tolerated edge of bed with max to total A and verbal cues. Unable to elicit righting reaction however was able to elicit protective extension x 3 with exaggerated loss of balance anteriorly. Pt transferred edge of bed to tilt space w/c with max A squat pivot. Pt repositioned in chair with total assist. Pt left sitting up in tilt in space with chair alarm and full lap tray in front of patient.   Therapy Documentation Precautions:  Precautions Precautions: Fall Precaution Comments: Contact precautions & delayed processing Restrictions Weight Bearing Restrictions: No General:   Pain: Pain Assessment Pain Scale: Faces Pain Score: 0-No pain Faces Pain Scale: No hurt  Therapy/Group: Individual Therapy  Rayford Halsted 12/17/2022, 12:21 PM

## 2022-12-18 MED ORDER — GERHARDT'S BUTT CREAM
TOPICAL_CREAM | Freq: Two times a day (BID) | CUTANEOUS | Status: DC
  Administered 2022-12-18 – 2023-05-06 (×2): 1 via TOPICAL
  Filled 2022-12-18 (×6): qty 1

## 2022-12-18 NOTE — Progress Notes (Signed)
Occupational Therapy Session Note  Patient Details  Name: Nicole [name] MRN: [id number] Date of Birth: January 28, 1976  Today's Date: 12/18/2022 OT Individual Time: 1305 (Simultaneous filing. User may not have seen previous data.)-1414 (Simultaneous filing. User may not have seen previous data.) OT Individual Time Calculation (min): 69 min (Simultaneous filing. User may not have seen previous data.)    Short Term Goals: Week 4:  OT Short Term Goal 1 (Week 4): Patient will sit unsupported at edge of bed x 15 seconds in preparation for level surface transfer OT Short Term Goal 2 (Week 4): Patient will transition from sit to stand and stand to sit as needed for lower body dressing with max assist OT Short Term Goal 3 (Week 4): Patient will turn head/ eyes toward auditory stimulation either right or left 25%/ time to aide in focused attention OT Short Term Goal 4 (Week 4): Patient will participate following hand over hand assistance in at least one familiar functional task x 5-10 seconds.  Skilled Therapeutic Interventions/Progress Updates:     Pt received sitting in forward flexed position in [facility] with [name] present in room. Pt presenting to be awake, alert, and calm- receptive to skilled OT session presenting to be 0/10 pain at rest- OT offering intermittent rest breaks, repositioning, and therapeutic support to optimize participation in therapy session. [name] inquiring about [name] cognitive status and current level of comprehension with gentle education provided. Educated on automatic response/reflexes vs intentional responses with [name] mom verbalizing understanding. Pt did turn head to OT when verbalizing pt's name upon OT arrival. Pt wearing soft neck collar, R elbow splint, and L hand palm protector upon OT arrival and maintained throughout session. Provided Pt water with Pt turning head and opening mouth in response to stimulus with pt consuming entire cup at beginning of session. Positioned  Pt at sink and engaged Pt in familiar BADL tasks. OT provided full hand over hand assistance with Pt washing face (pt turned head R/L to rub on wash cloth and continued task after OT released hand with her elbow propped on wc arm rest). OT provided full HOH A for brushing teeth with Pt opening mouth in response and turning head side to side in response to OT assisting with oral care. Attempted to decrease amount of assist, however Pt did not continue. Transported Pt total A to therapy gym in [facility]. Pt completed sit<>stands x3 trials total A in standing frame to facilitate weight bearing through B LEs and work on maintaining up right posture with OT providing trunk stability and facilitating improved postural alignment. Utilized Pt preferred music to support engagement in session. Pt did turn head x1trial in response to music and initiated moving R hand. Transported pt back to room total A in TISWC. Pt was left resting reclined in TISWC with call bell in reach, seat belt alarm on, lap tray in place, soft neck collar on, right elbow splint, and L hand palm protector donned, and all needs met.    Therapy Documentation Precautions:  Precautions Precautions: Fall Precaution Comments: Contact precautions & delayed processing Restrictions Weight Bearing Restrictions: No   Therapy/Group: Individual Therapy  PRANATHI DORAN 12/18/2022, 7:58 AM

## 2022-12-18 NOTE — Progress Notes (Signed)
Patient ID: Nicole [name], female   DOB: [birth year], 47 y.o.   MRN: [id number]  Met with Mom who is here to see how meeting went Friday at [facility]. She reports they spoke with her and pt's ex-husband regarding finances and she is working on long term care medicaid to cover her NH stay. Unsure how long this will take. She has a few people they gave her to call to help push through. Will need to have before can go to the facility due to has no payment source currently for SNF.

## 2022-12-18 NOTE — Progress Notes (Signed)
Occupational Therapy Session Note  Patient Details  Name: Nicole [name] MRN: [id number] Date of Birth: November 19, 1975  Today's Date: 12/18/2022 OT Individual Time: 0902-1003 OT Individual Time Calculation (min): 61 min    Short Term Goals: Week 4:  OT Short Term Goal 1 (Week 4): Patient will sit unsupported at edge of bed x 15 seconds in preparation for level surface transfer OT Short Term Goal 2 (Week 4): Patient will transition from sit to stand and stand to sit as needed for lower body dressing with max assist OT Short Term Goal 3 (Week 4): Patient will turn head/ eyes toward auditory stimulation either right or left 25%/ time to aide in focused attention OT Short Term Goal 4 (Week 4): Patient will participate following hand over hand assistance in at least one familiar functional task x 5-10 seconds.  Skilled Therapeutic Interventions/Progress Updates:    Patient received supine in bed - nursing providing care.  Patient assisted to roll to the left and sit at edge of bed, transferred to wheelchair with max assist.  Assisted patient with breakfast.  Patient sitting upright - head forward and tilted left.  Placed fork in right hand - hand over hand guidance to pierce food on plate.  Patient bringing hand to mouth, and orienting mouth to fork.  Patient not able to orient fork to center of lips, but adjusts mouth/ head position.  Closes lips around fork, chews and swallows.  Patient moves tongue to right cheek to clear food.  Alternated liquids and solids.  Patient showing improved hand to mouth pattern once food on fork.  Attempted with built up handle (red foam grip) but this was not as effective as just holding fork with thumb, index and long finger.  Once breakfast completed, assisted patient to dress in clothing with total assist.   Patient left up in wheelchair with lap tray, R elbow brace and mitt, left palm protector, and soft collar - slightly reclined.  Safety belt in place and engaged.     Therapy Documentation Precautions:  Precautions Precautions: Fall Precaution Comments: Contact precautions & delayed processing Restrictions Weight Bearing Restrictions: No   Pain:  Winces with range of motion to BUE    Therapy/Group: Individual Therapy  [name] 12/18/2022, 12:20 PM

## 2022-12-18 NOTE — Progress Notes (Signed)
PROGRESS NOTE   Subjective/Complaints:  Mom asking whether can "comprehend"    ROS: Unable to obtain due to cognitive/behavioral, + mild swelling of right wrist, +decreased initiation  Objective:   DG Wrist 2 Views Right  Result Date: 12/16/2022 CLINICAL DATA:  Right wrist pain swelling. EXAM: RIGHT WRIST - 2 VIEW COMPARISON:  None Available. FINDINGS: There is 1.5 mm ulnar positive variance. Joint spaces are preserved. No acute fracture or dislocation. There is a linear 2 x less than 1 mm density overlying the proximal shaft of the third metacarpal on frontal view and seen overlying the more volar soft tissues on oblique view. This is not well visualized on lateral view, and could represent a similar density seen within the overlying sheet/possible bandage material outside the patient, volar to a similar region of the proximal metacarpals on lateral view. IMPRESSION: 1. No acute fracture or dislocation. 2. A small linear density overlying the region of the proximal metacarpals on some views is favored to lie outside the patient on lateral view. Electronically Signed   By: Neita Garnet M.D.   On: 12/16/2022 16:04   No results for input(s): "WBC", "HGB", "HCT", "PLT" in the last 72 hours.  No results for input(s): "NA", "K", "CL", "CO2", "GLUCOSE", "BUN", "CREATININE", "CALCIUM" in the last 72 hours.    Intake/Output Summary (Last 24 hours) at 12/18/2022 1225 Last data filed at 12/18/2022 0841 Gross per 24 hour  Intake 477 ml  Output --  Net 477 ml      Pressure Injury 10/31/22 Buttocks Left Unstageable - Full thickness tissue loss in which the base of the injury is covered by slough (yellow, tan, gray, green or brown) and/or eschar (tan, brown or black) in the wound bed. (Active)  10/31/22 2000  Location: Buttocks  Location Orientation: Left  Staging: Unstageable - Full thickness tissue loss in which the base of the injury is  covered by slough (yellow, tan, gray, green or brown) and/or eschar (tan, brown or black) in the wound bed.  Wound Description (Comments):   Present on Admission: Yes     Pressure Injury 11/18/22 Toe (Comment  which one) Anterior;Right Deep Tissue Pressure Injury - Purple or maroon localized area of discolored intact skin or blood-filled blister due to damage of underlying soft tissue from pressure and/or shear. small are (Active)  11/18/22 1300  Location: Toe (Comment  which one)  Location Orientation: Anterior;Right  Staging: Deep Tissue Pressure Injury - Purple or maroon localized area of discolored intact skin or blood-filled blister due to damage of underlying soft tissue from pressure and/or shear.  Wound Description (Comments): small area on right great toe metatarsal  Present on Admission: Yes    Physical Exam: Vital Signs Blood pressure 131/87, pulse 91, temperature 97.9 F (36.6 C), temperature source Oral, resp. rate 18, height 5\' 1"  (1.549 m), weight 47.9 kg, SpO2 98%.   General: No acute distress Mood and affect are appropriate Heart: Regular rate and rhythm no rubs murmurs or extra sounds Lungs: Clear to auscultation, breathing unlabored, no rales or wheezes Abdomen: Positive bowel sounds, soft nontender to palpation, nondistended Extremities: No clubbing, cyanosis, or edema Skin: No evidence of breakdown,  no evidence of rash   Skin:  + Scaling dried skin on head - stable, stage 2 to right toe, area of redness between left thumb and index finger, left buttock unstageable  Neurologic: Does not respond to orientation questions, does not track, does not follow cues. Unable to shut eyes to verbal command, intermittent grimacing with rooting reflex Tone: MAS 3 right elbow, B knee flexors, and right hip; MAS 3 L elbow , MS 3-4 in wrist and finger flexors  Motor 0/5 strength in BUE and BUE  Cerebellar exam unable to assess  Musculoskeletal: reduced bilateral shoulder and  elbow ROM due to increased tone  RIght Elbow brace in place Strong neck flexion Right wrist swelling   Assessment/Plan: 1. Functional deficits which require 3+ hours per day of interdisciplinary therapy in a comprehensive inpatient rehab setting. Physiatrist is providing close team supervision and 24 hour management of active medical problems listed below. Physiatrist and rehab team continue to assess barriers to discharge/monitor patient progress toward functional and medical goals  Care Tool:  Bathing    Body parts bathed by patient: Chest, Face   Body parts bathed by helper: Right arm, Left arm, Chest, Abdomen, Front perineal area, Buttocks, Right upper leg, Left upper leg, Right lower leg, Left lower leg, Face Body parts n/a: Right arm, Right upper leg, Left arm, Left upper leg, Chest, Abdomen, Right lower leg, Front perineal area, Left lower leg, Buttocks, Face   Bathing assist Assist Level: Dependent - Patient 0%     Upper Body Dressing/Undressing Upper body dressing   What is the patient wearing?: Hospital gown only    Upper body assist Assist Level: Dependent - Patient 0%    Lower Body Dressing/Undressing Lower body dressing      What is the patient wearing?: Incontinence brief     Lower body assist Assist for lower body dressing: Dependent - Patient 0%     Toileting Toileting Toileting Activity did not occur (Clothing management and hygiene only): N/A (no void or bm)  Toileting assist Assist for toileting: Dependent - Patient 0%     Transfers Chair/bed transfer  Transfers assist  Chair/bed transfer activity did not occur: Safety/medical concerns  Chair/bed transfer assist level: Maximal Assistance - Patient 25 - 49%     Locomotion Ambulation   Ambulation assist   Ambulation activity did not occur: Safety/medical concerns          Walk 10 feet activity   Assist  Walk 10 feet activity did not occur: Safety/medical concerns        Walk  50 feet activity   Assist Walk 50 feet with 2 turns activity did not occur: Safety/medical concerns         Walk 150 feet activity   Assist Walk 150 feet activity did not occur: Safety/medical concerns         Walk 10 feet on uneven surface  activity   Assist Walk 10 feet on uneven surfaces activity did not occur: Safety/medical concerns         Wheelchair     Assist Is the patient using a wheelchair?: Yes Type of Wheelchair: Manual (tilt in space)    Wheelchair assist level: Dependent - Patient 0%      Wheelchair 50 feet with 2 turns activity    Assist    Wheelchair 50 feet with 2 turns activity did not occur: Safety/medical concerns   Assist Level: Dependent - Patient 0%   Wheelchair 150 feet activity  Assist  Wheelchair 150 feet activity did not occur: Safety/medical concerns   Assist Level: Dependent - Patient 0%   Blood pressure 131/87, pulse 91, temperature 97.9 F (36.6 C), temperature source Oral, resp. rate 18, height 5\' 1"  (1.549 m), weight 47.9 kg, SpO2 98%.  Medical Problem List and Plan: 1. 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, frontal release signs (rooting reflex)  Fluctuating level of alertness             -patient may  shower  Elbow splint and PRAFOs ordered             -ELOS/Goals: min/modA but prognosis is guarded due to severe cognitive deficits goals 28-30d  -Continue CIR therapies including PT, OT, and SLP    Tasia Catchings bed ordered  Mother updated  Palliative care consulted, discussed home with hospice but patient is not a candidate  Discussed neuro exam with mom, no signs of comprehension , very limited motor abilities due to 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  Impaired mobility:  continue Lovenox             -antiplatelet therapy: N/A 3. Pain Management: Tylenol as needed 4. Mood/Behavior/Sleep: Provide emotional support             -antipsychotic agents: N/A  5. Neuropsych/cognition: This patient is not capable of making decisions on her own behalf. 6. Left buttock unstageable ulcer.  Continue Medihoney to buttock wound daily cover with foam dressing.  Changing dressing every 3 days or as needed soiling   7. Fluids/Electrolytes/Nutrition: Routine in and outs with follow-up chemistries  -Labs 7/21 stable.  -Appears to have good p.o. intake  8.  Cavitary right lower lobe pneumonia likely aspiration pneumonia/MRSA pneumonia.  Resolved    Latest Ref Rng & Units 12/11/2022    7:15 AM 12/03/2022    7:18 AM 11/24/2022    6:25 AM  CBC  WBC 4.0 - 10.5 K/uL 9.0  8.7  5.5   Hemoglobin 12.0 - 15.0 g/dL 02.7  25.3  66.4   Hematocrit 36.0 - 46.0 % 45.1  39.7  38.5   Platelets 150 - 400 K/uL 329  218  249      9.  History of drug abuse.  Positive cocaine on urine drug screen.  Provide counseling  10.  AKI/hypovolemia and ATN.  resolved    Latest Ref Rng & Units 12/15/2022    7:12 AM 12/11/2022    7:15 AM 12/08/2022    6:21 AM  BMP  Glucose 70 - 99 mg/dL  403    BUN 6 - 20 mg/dL  13    Creatinine 4.74 - 1.00 mg/dL 2.59  5.63  8.75   Sodium 135 - 145 mmol/L  138    Potassium 3.5 - 5.1 mmol/L  3.5    Chloride 98 - 111 mmol/L  102    CO2 22 - 32 mmol/L  24    Calcium 8.9 - 10.3 mg/dL  9.2      11.  Mild transaminitis with rhabdomyolysis.  Both resolved  12.  Hypotension. Resolved, d/c midodrine  13. Impaired initiation: continue amantadine back to 100mg  BID  14. Suboptimal vitamin D: continue 1,000U D3 daily  15. Spasticity:   -Remains extremely tight, decrease baclofen to 15mg  TID due to fatigue. Appears to  have increased sedation with this increase, continue to monitor. Continue wrist, elbow brace, and PRAFOs. Add tizanidine 2mg  HS  16. Bowel and bladder  incontinence: continue bowel and bladder program   17. Left hand skin maceration: Eucerin ordered, discussed hand split with OT, conitnue  18. Tachycardia: mild,~90bpm decreased amantadine back to 100mg  BID, continue this dose  19. ? hx HTN: normotensive off meds    12/18/2022    4:13 AM 12/17/2022    8:20 PM 12/17/2022    8:08 PM  Vitals with BMI  Systolic 131  110  Diastolic 87  87  Pulse 91 88 89    20. Area of redness between left thumb and index finger: continue foam dressing  21. Scalp eczema: selsun blue ordered, continue  22. Fatigue: improved, continue baclofen 15mg  TID, check B12 level tomorrow.   23. Right wrist swelling: XR ordered and shows no acute fracture, shows 2.39mm ulnar variance, brace removed to given patient a break from it.   LOS: 30 days A FACE TO FACE EVALUATION WAS PERFORMED  Erick Colace 12/18/2022, 12:25 PM

## 2022-12-18 NOTE — Progress Notes (Signed)
Physical Therapy Session Note  Patient Details  Name: Nicole [name] MRN: [id number] Date of Birth: 08-05-1975  Today's Date: 12/18/2022 PT Individual Time: 1032-1133 PT Individual Time Calculation (min): 61 min   Short Term Goals: Week 3:  PT Short Term Goal 1 (Week 3): Pt will initiate bed mobility but require no more than totalA PT Short Term Goal 1 - Progress (Week 3): Not met PT Short Term Goal 2 (Week 3): Pt will maintain sitting balance unsupported for 30 seconds PT Short Term Goal 2 - Progress (Week 3): Not met PT Short Term Goal 3 (Week 3): Pt will demonstrate command follow for ~15% of tasks. PT Short Term Goal 3 - Progress (Week 3): Not met Week 4:  PT Short Term Goal 1 (Week 4): Pt will initiate bed mobility but require no more than totalA. PT Short Term Goal 2 (Week 4): Pt will maintain sitting balance unsupported for 30 seconds PT Short Term Goal 3 (Week 4): Pt will demonstrate command follow for ~15% of tasks.   Skilled Therapeutic Interventions/Progress Updates:  Patient seated upright in w/c with pillow prop to BUE on top of full lap tray on entrance to room. Head fully flexed forward despite soft c-collar donned. Patient awake/ alert and demos turn of eyes to L briefly with verbal recognition of pt from therapist while donning PPE for contact precautions.   Later in day, with mother and SW talking in room, door open and PT at [employer] pt verbally. Pt with full head turn and eye contact with this PT and holds for ~30 sec.   Patient with no indication of pain at start of session. Is seated in very flexed position despite back tilt of TIS w/c. Heavy/ strong neck flexion.  Therapeutic Activity/ Neuromuscular Re-ed: Transfers/NMR:  Pt guided into stance using standing frame. Music played for increased awareness and eventual active participation. Requires MaxA +2 to maintain neutral spine, scapular retraction, and cervical neck extension. After , noted increase in  resistance and move of head from therapist's hand d/t irritation with handhold of head for improved neck extension.    While seated, performed add'l stretching to pectoralis musculature with pressure to GH joints into scapular retraction positioning.   Pt does demo ability to raise head and hold higher in order to drink water from cup with straw. Provided with 2 cups of water during  standing bout and while seated throughout session    NMR performed for improvements in motor control, cognition, and maintaining body mobility for future purposeful movements.   Patient seated upright at end of session with brakes locked, full tray in place with pillow prop to elbows, belt alarm set, and all needs within reach.  Therapy Documentation Precautions:  Precautions Precautions: Fall Precaution Comments: Contact precautions & delayed processing Restrictions Weight Bearing Restrictions: No  Pain:  No indication of pain throughout session.   Therapy/Group: Individual Therapy  Loel Dubonnet PT, DPT, CSRS 12/18/2022, 1:00 PM

## 2022-12-18 NOTE — Progress Notes (Signed)
Occupational Therapy Session Note  Patient Details  Name: Nicole [name] MRN: [id number] Date of Birth: 06-Apr-1976  Today's Date: 12/18/2022 OT Individual Time: 8469-6295 OT Individual Time Calculation (min): 28 min    Short Term Goals: Week 4:  OT Short Term Goal 1 (Week 4): Patient will sit unsupported at edge of bed x 15 seconds in preparation for level surface transfer OT Short Term Goal 2 (Week 4): Patient will transition from sit to stand and stand to sit as needed for lower body dressing with max assist OT Short Term Goal 3 (Week 4): Patient will turn head/ eyes toward auditory stimulation either right or left 25%/ time to aide in focused attention OT Short Term Goal 4 (Week 4): Patient will participate following hand over hand assistance in at least one familiar functional task x 5-10 seconds.  Skilled Therapeutic Interventions/Progress Updates:  Pt received sitting in TIS for skilled OT session with focus on functional transfer and toileting at bed-level. Pt with intermediate grimacing during transitional movements. OT offering intermediate rest breaks and positioning suggestions throughout session to address pain/fatigue and maximize participation/safety in session.   Pt performs squat-pivot from TIS WC>EOB with Max A (+2 Mod A), returning to supine with Max A. Pt found to have large bladder incontinence in brief, dependent for changing, rolling with Max A. Pt has BM during rolling, RN updated for charting. [name] elbow splint doffed, pt offloaded onto R-side, pillow placed in between BLE for skin protection.   Pt remained resting in bed with all immediate needs met at end of session. Pt continues to be appropriate for skilled OT intervention to promote further functional independence.   Therapy Documentation Precautions:  Precautions Precautions: Fall Precaution Comments: Contact precautions & delayed processing Restrictions Weight Bearing Restrictions: No   Therapy/Group:  Individual Therapy  Lou Cal, OTR/L, MSOT  12/18/2022, 6:09 AM

## 2022-12-19 DIAGNOSIS — N179 Acute kidney failure, unspecified: Secondary | ICD-10-CM

## 2022-12-19 DIAGNOSIS — R159 Full incontinence of feces: Secondary | ICD-10-CM

## 2022-12-19 NOTE — Progress Notes (Signed)
Speech Language Pathology Weekly Progress and Session Note  Patient Details  Name: Nicole [name] MRN: [id number] Date of Birth: 1975/07/06  Beginning of progress report period: December 12, 2022 End of progress report period: December 19, 2022  Today's Date: 12/19/2022 SLP Individual Time: 1000-1100 SLP Individual Time Calculation (min): 60 min  Short Term Goals: Week 4: SLP Short Term Goal 1 (Week 4): Pt will present w/ appropriate response to functional stimuli (i.e. accept spoon, grasp basic item appropriately, response to tactile stimuli) 15% of the time w/ maxA multimodal cues SLP Short Term Goal 1 - Progress (Week 4): Not met SLP Short Term Goal 2 (Week 4): Patient will follow 1-step commands within a context in 10% of opportunities with Max A multimodal cues. SLP Short Term Goal 2 - Progress (Week 4): Not met SLP Short Term Goal 3 (Week 4): Patient will utilize multimodal communication to answer basic yes/no questions with Max A multimodal cues in 10% of opportunities SLP Short Term Goal 3 - Progress (Week 4): Not met    New Short Term Goals: Week 5: SLP Short Term Goal 1 (Week 5): Pt will present w/ appropriate response to functional stimuli (i.e. accept spoon, grasp basic item appropriately, response to tactile/auditory stimuli) 15% of the time w/ maxA multimodal cues SLP Short Term Goal 2 (Week 5): Patient will follow 1-step commands within a context in 5% of opportunities with Max A multimodal cues. SLP Short Term Goal 3 (Week 5): Patient will utilize multimodal communication to answer basic yes/no questions with maxA in 5% of opportunities  Weekly Progress Updates:  Pt presented w/ minimal progress this week as she met 0/3 ST goals. She continues to present w/ slow progress towards long term ST goals given the severity of her deficits. She demonstrated slightly improved responsiveness to functional stimuli, including eye gaze to auditory stimuli/staff's voices and attempt to continue  functional movements w/ hand over hand assist from SLP. She demonstrates emerging success w/ STG re appropriate responses, though success remains variable and minimal overall. She continues to benefit from totalA to follow 1 step commands and answer Y/N questions. She would benefit from continued ST services to target functional communication, attention, overall responsiveness to her environment, and family education. Will continue to attempt to ID a communication system to maximize pt independence.    Intensity: Minumum of 1-2 x/day, 30 to 90 minutes Frequency: 1 to 3 out of 7 days Duration/Length of Stay: awaiting SNF placement Treatment/Interventions: Cognitive remediation/compensation;Dysphagia/aspiration precaution training;Internal/external aids;Speech/Language facilitation;Cueing hierarchy;Environmental controls;Therapeutic Activities;Functional tasks;Multimodal communication approach;Patient/family education;Therapeutic Exercise;DME/adaptive equipment instruction   Daily Session  Skilled Therapeutic Interventions: Pt greeted in her room. She was awake/alert in her TIS chair upon SLP arrival. SLP facilitated functional tasks targeting cognition and responses to external stimuli. She benefited from hand over hand assist to grasp hairbrush and brush the R side of her hair. She also benefited from hand over hand assist during writing task, but was able to write her first and last name w/ total assist from SLP. She demonstrated adequate eye gaze in the direction of SLP and other auditory stimuli ~60% of the time. When provided w/ pt preferred music, she benefited from hand over hand assist to tap along to the beat of the music. She fixated her eyes towards the music for ~5 mins, which is the longest amount of time to date. She demonstrated reduced tension in her RUE during music as well. She was left in her chair w/ the alarm set. Recommend  cont ST per POC.     Pain  Unable to report pain - appeared  comfortable   Therapy/Group: Individual Therapy  [name] 12/19/2022, 11:04 AM

## 2022-12-19 NOTE — Progress Notes (Signed)
Occupational Therapy Session Note  Patient Details  Name: Nicole [name] MRN: [id number] Date of Birth: 1975-09-02  Today's Date: 12/19/2022 OT Individual Time: 4098-1191 OT Individual Time Calculation (min): 25 min    Short Term Goals: Week 4:  OT Short Term Goal 1 (Week 4): Patient will sit unsupported at edge of bed x 15 seconds in preparation for level surface transfer OT Short Term Goal 2 (Week 4): Patient will transition from sit to stand and stand to sit as needed for lower body dressing with max assist OT Short Term Goal 3 (Week 4): Patient will turn head/ eyes toward auditory stimulation either right or left 25%/ time to aide in focused attention OT Short Term Goal 4 (Week 4): Patient will participate following hand over hand assistance in at least one familiar functional task x 5-10 seconds.  Skilled Therapeutic Interventions/Progress Updates:  Pt greeted supine in bed with NT present. Noted brief to be wet, total A for pericare and brief change from bed level. Pt completed supine>sit with total A. MAX A for static sitting balance from EOB. Pt completed stand pivot transfer from EOB>TIS to R side with total A. Provided PROM to LUE in order to don elbow extension splint to LUE per communication with primary PT. Pt left in w/c with NT for self feeding.   Therapy Documentation Precautions:  Precautions Precautions: Fall Precaution Comments: Contact precautions & delayed processing Restrictions Weight Bearing Restrictions: No  Pain: Noted grimacing when ranging LUE, provided rest breaks as needed.    Therapy/Group: Individual Therapy  [name] [facility] 12/19/2022, 8:15 AM

## 2022-12-19 NOTE — Progress Notes (Signed)
Physical Therapy Session Note  Patient Details  Name: PENELLOPE KILSON MRN: [id number] Date of Birth: [birth year]  Today's Date: 12/19/2022 PT Individual Time: 1335-1433 PT Individual Time Calculation (min): 58 min   Short Term Goals: Week 3:  PT Short Term Goal 1 (Week 3): Pt will initiate bed mobility but require no more than totalA PT Short Term Goal 1 - Progress (Week 3): Not met PT Short Term Goal 2 (Week 3): Pt will maintain sitting balance unsupported for 30 seconds PT Short Term Goal 2 - Progress (Week 3): Not met PT Short Term Goal 3 (Week 3): Pt will demonstrate command follow for ~15% of tasks. PT Short Term Goal 3 - Progress (Week 3): Not met Week 4:  PT Short Term Goal 1 (Week 4): Pt will initiate bed mobility but require no more than totalA. PT Short Term Goal 2 (Week 4): Pt will maintain sitting balance unsupported for 30 seconds PT Short Term Goal 3 (Week 4): Pt will demonstrate command follow for ~15% of tasks. Week 5:     Skilled Therapeutic Interventions/Progress Updates:      Therapy Documentation Precautions:  Precautions Precautions: Fall Precaution Comments: Contact precautions & delayed processing Restrictions Weight Bearing Restrictions: No General:   Vital Signs:   Pain: Pain Assessment Pain Scale: Faces Faces Pain Scale: No hurt  Therapy/Group: Individual Therapy  Loel Dubonnet PT, DPT, CSRS 12/19/2022, 5:50 PM

## 2022-12-19 NOTE — Progress Notes (Signed)
Occupational Therapy Session Note  Patient Details  Name: Nicole [name] MRN: [id number] Date of Birth: 11/19/1975  Today's Date: 12/19/2022 OT Individual Time: 1104-1200 OT Individual Time Calculation (min): 56 min    Short Term Goals: Week 4:  OT Short Term Goal 1 (Week 4): Patient will sit unsupported at edge of bed x 15 seconds in preparation for level surface transfer OT Short Term Goal 2 (Week 4): Patient will transition from sit to stand and stand to sit as needed for lower body dressing with max assist OT Short Term Goal 3 (Week 4): Patient will turn head/ eyes toward auditory stimulation either right or left 25%/ time to aide in focused attention OT Short Term Goal 4 (Week 4): Patient will participate following hand over hand assistance in at least one familiar functional task x 5-10 seconds.  Skilled Therapeutic Interventions/Progress Updates:     Donned proper PPE prior to entering [name] room. Pt received sitting in TISWC forward flexed with soft neck collor on, full tray table in place, L elbow splint on and R soft mit on. Doffed soft mit during therapy session and replaced mit at end of session. Pt presenting to be awake, alert, and calm upon OT arrival presenting to be receptive to skilled OT session with 0/10 pain- OT offering intermittent rest breaks, repositioning, and therapeutic support to optimize participation in therapy session. Pt turned head in response to therapist calling out [name] name upon arrival. Doffed gown and donned clean shirt total A with Pt intermittently turning head or changing facial expression in response to OT mobilizing B UEs. Weaved B LEs into pants total and facilitated Pt coming into standing position total A to allow for +2 RT to bring pants to waist. Transported Pt total A to therapy gym in [facility] for time management. Squat pivot TISWC>EOM max A. Worked on sitting balance, protective responses, and trunk positioning with Pt EOM. Therapist sat on therball  positioned behind Pt and provided gentle prolonged stretch of neck and facilitated trunk extension for improved postural alignment with Pt tolerating without signs of increased pain. Utilized Pt preferred music to support participation and alertness with Pt noted to begin "strumming" fingers and to shake her R hand in response to the music. No protective responses noted at this time. Pt was able to tolerate sitting EOM >30 minutes during session with posterior trunk support and intermittently maintaining balance without support for 3-8 seconds. Worked on functional movement patterns of brining hand to mouth with [name] R hand positioned on narrow cone and OT initiating movement through total hand over hand assist transitioning to Pt able to bring cone to mouth automatically following learning requested task. Squat pivot EOM>TISWC max A. Transported pt back to room in [facility] total A. Pt was left resting in TISWC with call bell in reach, seat belt alarm on, R soft mit in place, tray table in place, L elbow splint donned, and all needs met.    Therapy Documentation Precautions:  Precautions Precautions: Fall Precaution Comments: Contact precautions & delayed processing Restrictions Weight Bearing Restrictions: No   Therapy/Group: Individual Therapy  Nicole [name] 12/19/2022, 8:00 AM

## 2022-12-19 NOTE — Progress Notes (Signed)
PROGRESS NOTE   Subjective/Complaints:  Pt sitting in WC. NO concerns elicited this AM.  ROS: Unable to obtain due to cognitive/behavioral, + mild swelling of right wrist, +decreased initiation  Objective:   No results found. No results for input(s): "WBC", "HGB", "HCT", "PLT" in the last 72 hours.  No results for input(s): "NA", "K", "CL", "CO2", "GLUCOSE", "BUN", "CREATININE", "CALCIUM" in the last 72 hours.    Intake/Output Summary (Last 24 hours) at 12/19/2022 1216 Last data filed at 12/19/2022 0816 Gross per 24 hour  Intake 356 ml  Output --  Net 356 ml      Pressure Injury 10/31/22 Buttocks Left Unstageable - Full thickness tissue loss in which the base of the injury is covered by slough (yellow, tan, gray, green or brown) and/or eschar (tan, brown or black) in the wound bed. (Active)  10/31/22 2000  Location: Buttocks  Location Orientation: Left  Staging: Unstageable - Full thickness tissue loss in which the base of the injury is covered by slough (yellow, tan, gray, green or brown) and/or eschar (tan, brown or black) in the wound bed.  Wound Description (Comments):   Present on Admission: Yes     Pressure Injury 11/18/22 Toe (Comment  which one) Anterior;Right Deep Tissue Pressure Injury - Purple or maroon localized area of discolored intact skin or blood-filled blister due to damage of underlying soft tissue from pressure and/or shear. small are (Active)  11/18/22 1300  Location: Toe (Comment  which one)  Location Orientation: Anterior;Right  Staging: Deep Tissue Pressure Injury - Purple or maroon localized area of discolored intact skin or blood-filled blister due to damage of underlying soft tissue from pressure and/or shear.  Wound Description (Comments): small area on right great toe metatarsal  Present on Admission: Yes    Physical Exam: Vital Signs Blood pressure 106/70, pulse 63, temperature 98.1 F  (36.7 C), temperature source Oral, resp. rate 15, height 5\' 1"  (1.549 m), weight 47.9 kg, SpO2 100%.   General: No acute distress, sitting In WC Mood and affect are appropriate Heart: Regular rate and rhythm no rubs murmurs or extra sounds Lungs:CTAB,  breathing unlabored, no rales or wheezes Abdomen: Positive bowel sounds, soft nontender to palpation, nondistended Extremities: No clubbing, cyanosis, or edema  Skin:  + Scaling dried skin on head - stable, stage 2 to right toe, area of redness between left thumb and index finger-covered in dressing, left buttock unstageable  Neurologic: Does not respond to orientation questions, does not track, does not follow cues. Does not follow verbal commands  Tone: MAS 3 right elbow, B knee flexors, and right hip; MAS 3 L elbow , MS 3-4 in wrist and finger flexors  Motor 0/5 strength in BUE and BUE  Cerebellar exam unable to assess  Musculoskeletal: reduced bilateral shoulder and elbow ROM due to increased tone  RIght Elbow brace in place Strong neck flexion Right wrist swelling   Assessment/Plan: 1. Functional deficits which require 3+ hours per day of interdisciplinary therapy in a comprehensive inpatient rehab setting. Physiatrist is providing close team supervision and 24 hour management of active medical problems listed below. Physiatrist and rehab team continue to assess barriers to discharge/monitor  patient progress toward functional and medical goals  Care Tool:  Bathing    Body parts bathed by patient: Chest, Face   Body parts bathed by helper: Right arm, Left arm, Chest, Abdomen, Front perineal area, Buttocks, Right upper leg, Left upper leg, Right lower leg, Left lower leg, Face Body parts n/a: Right arm, Right upper leg, Left arm, Left upper leg, Chest, Abdomen, Right lower leg, Front perineal area, Left lower leg, Buttocks, Face   Bathing assist Assist Level: Dependent - Patient 0%     Upper Body Dressing/Undressing Upper  body dressing   What is the patient wearing?: Hospital gown only    Upper body assist Assist Level: Dependent - Patient 0%    Lower Body Dressing/Undressing Lower body dressing      What is the patient wearing?: Incontinence brief     Lower body assist Assist for lower body dressing: Dependent - Patient 0%     Toileting Toileting Toileting Activity did not occur (Clothing management and hygiene only): N/A (no void or bm)  Toileting assist Assist for toileting: Dependent - Patient 0%     Transfers Chair/bed transfer  Transfers assist  Chair/bed transfer activity did not occur: Safety/medical concerns  Chair/bed transfer assist level: Maximal Assistance - Patient 25 - 49%     Locomotion Ambulation   Ambulation assist   Ambulation activity did not occur: Safety/medical concerns          Walk 10 feet activity   Assist  Walk 10 feet activity did not occur: Safety/medical concerns        Walk 50 feet activity   Assist Walk 50 feet with 2 turns activity did not occur: Safety/medical concerns         Walk 150 feet activity   Assist Walk 150 feet activity did not occur: Safety/medical concerns         Walk 10 feet on uneven surface  activity   Assist Walk 10 feet on uneven surfaces activity did not occur: Safety/medical concerns         Wheelchair     Assist Is the patient using a wheelchair?: Yes Type of Wheelchair: Manual (tilt in space)    Wheelchair assist level: Dependent - Patient 0%      Wheelchair 50 feet with 2 turns activity    Assist    Wheelchair 50 feet with 2 turns activity did not occur: Safety/medical concerns   Assist Level: Dependent - Patient 0%   Wheelchair 150 feet activity     Assist  Wheelchair 150 feet activity did not occur: Safety/medical concerns   Assist Level: Dependent - Patient 0%   Blood pressure 106/70, pulse 63, temperature 98.1 F (36.7 C), temperature source Oral, resp. rate  15, height 5\' 1"  (1.549 m), weight 47.9 kg, SpO2 100%.  Medical Problem List and Plan: 1. 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, frontal release signs (rooting reflex)  Fluctuating level of alertness             -patient may  shower  Elbow splint and PRAFOs ordered             -ELOS/Goals: min/modA but prognosis is guarded due to severe cognitive deficits goals 28-30d  -Continue CIR therapies including PT, OT, and SLP    Tasia Catchings bed ordered  Mother updated  Palliative care consulted, discussed home with hospice but patient is  not a candidate  Discussed neuro exam with mom, no signs of comprehension , very limited motor abilities due to 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  Impaired mobility: continue Lovenox             -antiplatelet therapy: N/A 3. Pain Management: Tylenol as needed 4. Mood/Behavior/Sleep: Provide emotional support             -antipsychotic agents: N/A  5. Neuropsych/cognition: This patient is not capable of making decisions on her own behalf. 6. Left buttock unstageable ulcer.  Continue Medihoney to buttock wound daily cover with foam dressing.  Changing dressing every 3 days or as needed soiling   7. Fluids/Electrolytes/Nutrition: Routine in and outs with follow-up chemistries  -Labs 7/21 stable.  -Eating 100% most meals with total assist  8.  Cavitary right lower lobe pneumonia likely aspiration pneumonia/MRSA pneumonia.  Resolved    Latest Ref Rng & Units 12/11/2022    7:15 AM 12/03/2022    7:18 AM 11/24/2022    6:25 AM  CBC  WBC 4.0 - 10.5 K/uL 9.0  8.7  5.5   Hemoglobin 12.0 - 15.0 g/dL 16.1  09.6  04.5   Hematocrit 36.0 - 46.0 % 45.1  39.7  38.5   Platelets 150 - 400 K/uL 329  218  249      9.  History of drug abuse.   Positive cocaine on urine drug screen.  Provide counseling  10.  AKI/hypovolemia and ATN.  resolved    Latest Ref Rng & Units 12/15/2022    7:12 AM 12/11/2022    7:15 AM 12/08/2022    6:21 AM  BMP  Glucose 70 - 99 mg/dL  409    BUN 6 - 20 mg/dL  13    Creatinine 8.11 - 1.00 mg/dL 9.14  7.82  9.56   Sodium 135 - 145 mmol/L  138    Potassium 3.5 - 5.1 mmol/L  3.5    Chloride 98 - 111 mmol/L  102    CO2 22 - 32 mmol/L  24    Calcium 8.9 - 10.3 mg/dL  9.2    Recheck labs tomorrow ordered  11.  Mild transaminitis with rhabdomyolysis.  Both resolved  12.  Hypotension. Resolved, d/c midodrine  13. Impaired initiation: continue amantadine back to 100mg  BID  14. Suboptimal vitamin D: continue 1,000U D3 daily  15. Spasticity:   -Remains extremely tight, decrease baclofen to 15mg  TID due to fatigue. Appears to have increased sedation with this increase, continue to monitor. Continue wrist, elbow brace, and PRAFOs. Add tizanidine 2mg  HS  16. Bowel and bladder incontinence: continue bowel and bladder program  -LBM 8/5   17. Left hand skin maceration: Eucerin ordered, discussed hand split with OT, conitnue  18. Tachycardia: mild,~90bpm decreased amantadine back to 100mg  BID, continue this dose  -8/6 improved HR stable in 80s     12/19/2022    4:25 AM 12/18/2022    8:52 PM 12/18/2022    4:25 PM  Vitals with BMI  Systolic 106 126 213  Diastolic 70 80 92  Pulse 63 88 84     19. ? hx HTN: normotensive off meds  -8/6 normotensive, continue to monitor 20. Area of redness between left thumb and index finger: continue foam dressing  21. Scalp eczema: selsun blue ordered, continue  22. Fatigue: improved, continue baclofen 15mg  TID, check B12 level tomorrow-B12 335  23.  Right wrist swelling: XR ordered and shows no acute fracture, shows 2.53mm ulnar variance, brace removed to given patient a break from it.   LOS: 31 days A FACE TO FACE EVALUATION WAS PERFORMED  Fanny Dance 12/19/2022, 12:16 PM

## 2022-12-19 NOTE — Plan of Care (Signed)
Patient without distress this shift. Vital signs stable. Patient awake and alert. Tolerated medication without incident. Patient repositioned frequently. Splints and braces in place. All needs attended to. Will continue to monitor.

## 2022-12-20 DIAGNOSIS — M25431 Effusion, right wrist: Secondary | ICD-10-CM

## 2022-12-20 DIAGNOSIS — G931 Anoxic brain damage, not elsewhere classified: Principal | ICD-10-CM

## 2022-12-20 MED ORDER — CYANOCOBALAMIN 1000 MCG/ML IJ SOLN
1000.0000 ug | Freq: Once | INTRAMUSCULAR | Status: AC
  Administered 2022-12-20: 1000 ug via INTRAMUSCULAR
  Filled 2022-12-20: qty 1

## 2022-12-20 NOTE — Progress Notes (Signed)
Occupational Therapy Weekly Progress Note  Patient Details  Name: Nicole [name] MRN: [id number] Date of Birth: Nov 01, 1975  Beginning of progress report period: December 13, 2022 End of progress report period: December 20, 2022  Today's Date: 12/20/2022 OT Individual Time: 1660-6301 OT Individual Time Calculation (min): 73 min    Patient has met 3 of 4 short term goals.  Patient showing improved static sitting balance, and improved response to auditory stimulus by orienting head/eyes toward sound.   Patient continues to demonstrate the following deficits: muscle weakness and muscle joint tightness, abnormal tone, unbalanced muscle activation, motor apraxia, and decreased coordination, decreased visual motor skills, decreased midline orientation and decreased motor planning, decreased initiation, decreased attention, decreased awareness, decreased problem solving, decreased safety awareness, decreased memory, and delayed processing, and decreased sitting balance, decreased standing balance, decreased postural control, hemiplegia, and decreased balance strategies and therefore will continue to benefit from skilled OT intervention to enhance overall performance with BADL.  Patient progressing toward long term goals..  Continue plan of care.  OT Short Term Goals Week 4:  OT Short Term Goal 1 (Week 4): Patient will sit unsupported at edge of bed x 15 seconds in preparation for level surface transfer OT Short Term Goal 1 - Progress (Week 4): Progressing toward goal OT Short Term Goal 2 (Week 4): Patient will transition from sit to stand and stand to sit as needed for lower body dressing with max assist OT Short Term Goal 2 - Progress (Week 4): Met OT Short Term Goal 3 (Week 4): Patient will turn head/ eyes toward auditory stimulation either right or left 25%/ time to aide in focused attention OT Short Term Goal 3 - Progress (Week 4): Met OT Short Term Goal 4 (Week 4): Patient will participate following hand  over hand assistance in at least one familiar functional task x 5-10 seconds. OT Short Term Goal 4 - Progress (Week 4): Met Week 5:  OT Short Term Goal 1 (Week 5): Patient will sit unsupported at edge of bed x 15 seconds in preparation for level surface transfer OT Short Term Goal 2 (Week 5): Patient will demonstrate improved focused attention as evidenced by sustaining eye contact x at least 10 seconds while in supported position. OT Short Term Goal 3 (Week 5): Patient will demonstrate improved passive/active range of motion in right fingers to allow improved functional use of RUE for participation in BADL  Skilled Therapeutic Interventions/Progress Updates:    Patient received seated in reclined position in tilt in space wheelchair.  Patient with head pulling forward into flexion - neck brace in place, lap tray in place.  Patient transported to gym, and transferred to mat table with max assist. Worked initially on static sitting.  Patient could sit for over a minute with just contact guard - e.g one hand on patient's right or left hand.  Patient equally loses balances forward or backward in sitting.  Transitioned to supine - worked on rolling left and right with emphasis on isolating body segments, also using body over arm movement to reduce tension and improve range in shoulders.  Patient actually able to relax head into pillows in supine.  Worked to improve lower body initiated rolling, then upper body.  Working to allow head to move in supported position - left/right to aide with rolling.   Once relaxed patient able to follow simple command to open mouth - with demonstration and/or oral stim - touching chin.  Nursing requested patient be returned to bed at  end of session to allow for wound check.  Patient transported back to room, back to bed.  Soft collar removed, resting hand splint placed on right hand to promote increased range of motion, and to de-emphasize picking at scalp.  PT will remove during  pm session.  Direct hand off to nursing.     Therapy Documentation Precautions:  Precautions Precautions: Fall Precaution Comments: Contact precautions & delayed processing Restrictions Weight Bearing Restrictions: No   Pain: Pain Assessment Pain Scale: Faces Faces Pain Scale: Hurts little more Pain Type: Acute pain      Therapy/Group: Individual Therapy  [name] 12/20/2022, 11:58 AM

## 2022-12-20 NOTE — Patient Care Conference (Signed)
Inpatient RehabilitationTeam Conference and Plan of Care Update Date: 12/20/2022   Time: 11:07 AM    Patient Name: Nicole [name]      Medical Record Number: [id number]  Date of Birth: March 09, 1976 Sex: Female         Room/Bed: 4W09C/4W09C-01 Payor Info: Payor: [employer] / Plan: [employer] / Product Type: *No Product type* /    Admit Date/Time:  11/18/2022  3:15 PM  Primary Diagnosis:  Hypoxic brain injury [facility])  Hospital Problems: Principal Problem:   Hypoxic brain injury [facility])    Expected Discharge Date: Expected Discharge Date:  (SNF pending)  Team Members Present: Physician leading conference: Dr. Sula Soda Social Worker Present: Dossie Der, LCSW Nurse Present: Chana Bode, RN PT Present: Casimiro Needle, PT OT Present: Bretta Bang, OT SLP Present: Other (comment) Catha Nottingham, SLP) PPS Coordinator present : Fae Pippin, SLP     Current Status/Progress Goal Weekly Team Focus  Bowel/Bladder   Inc of B+B, pericare provided. LBM 8/5   Regain control of bowel and bladder   Assist with care.    Swallow/Nutrition/ Hydration   Dysphagia 3 w/ thin liquids   goal met  goal met    ADL's   dependent in BADL, improved participation in self feeding, adn some bathing, improved alertness and localizing to sound   max / mod assist   imporve focused attention, participation in self care, and ability to respond to commands    Mobility   Continued DEP care for all functional mobility. Nonverbal and still no attempts to vocalize. Severely flexed posturing with bracing applied to ankles, wrists, elbows and neck. This week has demonstrated correct and volitional clearing of throat, and intermittently brings eyes and turns neck to scan L on hearing voice to L. Increasing flexor tone noted in RLE. Also noted increased R hand twitching with playing of music. Will lift head in order to drink water from straw.   TotA overall  Barriers: extremely low awareness,  flexed posturing, no/ rare volitional movements /// Continued focus on bed mobility, sitting balance, improving alertness and volitional movements, functional transfers, stretching into extension, any NMR and cueing for volitional movements/ motor control    Communication   total A to follow 1 step commands and answer Y/N questions   maxA   improve attention to establish multimodal communication system    Safety/Cognition/ Behavioral Observations  total A - seemingly limited to no response to stimuli   maxA   sustained attention, response to auditory stimuli/eye gaze    Pain   Using FACES pain scale, no pain noted.   Remain comfortable.   Assess pain management and goals for comfort.    Skin      Wound to sacrum; medihoney/foam   Wound healing    Assess skin q shift; off loading, wound care treatment plan activation    Discharge Planning:  Mom working on Long term Care Medicaid-SSD/SSI application completed. Awaiting funding source so can transfer to [facility].   Team Discussion: Patient continues to be dependent care post hypoxic brain injury, remains non verbal with flexed position and bracing.  Patient on target to meet rehab goals: Currently inconsistent functional levels; scans to the left to voice, self feeds with spoon load and will participate in bathing.   *See Care Plan and progress notes for long and short-term goals.   Revisions to Treatment Plan:  Xray right wrist; no fracture but separation; brace removed temporarily   Teaching Needs: Safety, medications,  skin care, toileting, transfers, etc.   Current Barriers to Discharge: Home enviroment access/layout, Incontinence, Wound care, and Lack of/limited family support  Possible Resolutions to Barriers: SNF recommended Insurance approval pending Bed available once SNF approved by insurance     Medical Summary Current Status: history of hypertension, spasticity, dry scalp, pressure injury, right wrist  ulnar variance with swelling, severe neck flexion, decreased inititation  Barriers to Discharge: Medical stability  Barriers to Discharge Comments: history of hypertension, spasticity, dry scalp, pressure injury, right wrist ulnar variance with swelling, severe neck flexion, decreased inititation Possible Resolutions to [employer]: continue to monitor blood pressure TID, tizanidine 6mg  started at night, continue Selsun blue, continue medihoney, continue baclofen, continue amantadine   Continued Need for Acute Rehabilitation Level of Care: The patient requires daily medical management by a physician with specialized training in physical medicine and rehabilitation for the following reasons: Direction of a multidisciplinary physical rehabilitation program to maximize functional independence : Yes Medical management of patient stability for increased activity during participation in an intensive rehabilitation regime.: Yes Analysis of laboratory values and/or radiology reports with any subsequent need for medication adjustment and/or medical intervention. : Yes   I attest that I was present, lead the team conference, and concur with the assessment and plan of the team.   Chana Bode B 12/20/2022, 3:50 PM

## 2022-12-20 NOTE — Progress Notes (Signed)
Speech Language Pathology Daily Session Note  Patient Details  Name: Nicole [name] MRN: [id number] Date of Birth: 1975-08-07  Today's Date: 12/20/2022 SLP Individual Time: 1400-1430 SLP Individual Time Calculation (min): 30 min  Short Term Goals: Week 5: SLP Short Term Goal 1 (Week 5): Pt will present w/ appropriate response to functional stimuli (i.e. accept spoon, grasp basic item appropriately, response to tactile/auditory stimuli) 15% of the time w/ maxA multimodal cues SLP Short Term Goal 2 (Week 5): Patient will follow 1-step commands within a context in 5% of opportunities with Max A multimodal cues. SLP Short Term Goal 3 (Week 5): Patient will utilize multimodal communication to answer basic yes/no questions with maxA in 5% of opportunities  Skilled Therapeutic Interventions:   Pt greeted at bedside. She was awake/alert upon SLP arrival. Tx tasks targeted cognition. SLP assisted pt w/ oral care via suctioning to facilitate familiar task for pt to participate in. She benefited from hand over hand assist throughout the entire task. She did demonstrate improved eye gaze and tracking, as she consistently turned towards SLP's voice throughout the tx session. SLP utilized errorless learning via calendar to review orientation information. SLP facilitated hand over hand task counting 1-5 on her hands and making a thumbs up/down. Tone in RUE negatively impacted success slightly, though pt was able to complete all requested numbers/positions w/ SLPs assist. Towards the end of the tx session, SLP played pt preferred music and provided hand over hand assist to keep a beat with her R hand. She presented w/ reduced tension while music was playing and remained lin a more relaxed position as SLP left. She was left in bed with the alarm set and call light within reach. Recommend cont ST per POC.  Pain  Unable to report pain. Appeared comfortable.   Therapy/Group: Individual Therapy  [name] 12/20/2022,  5:03 PM

## 2022-12-20 NOTE — Progress Notes (Addendum)
Physical Therapy Weekly Progress Note  Patient Details  Name: Nicole [name] MRN: [id number] Date of Birth: August 08, 1975  Beginning of progress report period: December 13, 2022 End of progress report period: December 23, 2022  Today's Date: 12/23/2022  Patient has met 0 of 3 short term goals.  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 She continues to require Total/ DEP physical assist to complete all mobility. She maintains decorticate posturing while supine and significant UB flexor tone with significant head/ neck flexion when seated in supported upright sitting position. RLE demos increasing tone and increased potential for contracture, so time spent in stretching of LE flexors in order to maintain full ROM in BLE for potential future volitional movement. Overall she is limited volitional movement but does demo facial grimace with movements of body. Can lift head in response to cup with straw placed in front of her or food placed near her face. Recent improved and increased response of eye movement and head turn to L with identification of verbal stimulation from L and R. Family education has not occurred as pt most likely discharging to SNF.      Patient continues to demonstrate the following deficits muscle weakness and muscle joint tightness, decreased cardiorespiratoy endurance, abnormal tone, decreased coordination, and decreased motor planning, decreased midline orientation, decreased awareness of self, and decreased sitting balance, decreased standing balance, and decreased postural control and therefore will continue to benefit from skilled PT intervention to increase functional independence with mobility.  Patient not progressing toward long term goals.  See goal revision..  Continue plan of care. [name] goals all at [facility] for any potential increase in volitional mobility.  PT Short Term Goals Week 4:  PT  Short Term Goal 1 (Week 4): Pt will initiate bed mobility but require no more than totalA. PT Short Term Goal 1 - Progress (Week 4): Not met PT Short Term Goal 2 (Week 4): Pt will maintain sitting balance unsupported for 30 seconds PT Short Term Goal 2 - Progress (Week 4): Progressing toward goal PT Short Term Goal 3 (Week 4): Pt will demonstrate command follow for ~15% of tasks. PT Short Term Goal 3 - Progress (Week 4): Not met Week 5:  PT Short Term Goal 1 (Week 5): Pt will initiate bed mobility requiring no more than totalA. PT Short Term Goal 2 (Week 5): Pt will maintain sitting balance unsupported for up to 30 seconds PT Short Term Goal 3 (Week 5): Pt will demonstrate command follow for >10% of tasks. PT Short Term Goal 4 (Week 5): [name] RLE will demonstrate improved joint mobility into knee/ hip extension and ankle DF to neutral.  Skilled Therapeutic Interventions/Progress Updates:  Ambulation/gait training;Discharge planning;Functional mobility training;Psychosocial support;Therapeutic Activities;Visual/perceptual remediation/compensation;Balance/vestibular training;Disease management/prevention;Neuromuscular re-education;Skin care/wound management;Therapeutic Exercise;Wheelchair propulsion/positioning;Cognitive remediation/compensation;DME/adaptive equipment instruction;Pain management;Splinting/orthotics;UE/LE Strength taining/ROM;Community reintegration;Functional electrical stimulation;Patient/family education;UE/LE Coordination activities;Stair training   Therapy Documentation Precautions:  Precautions Precautions: Fall Precaution Comments: Contact precautions & delayed processing Restrictions Weight Bearing Restrictions: No  Pain: Pain Assessment Pain Scale: Faces Faces Pain Scale: Hurts little more Pain Type: Acute pain  Therapy/Group: Individual Therapy  Loel Dubonnet PT, DPT, CSRS 12/23/2022, 8:00 AM

## 2022-12-20 NOTE — Progress Notes (Signed)
Physical Therapy Session Note  Patient Details  Name: Nicole [name] MRN: [id number] Date of Birth: [birth year]  Today's Date: 12/20/2022 PT Individual Time: 7829-5621 PT Individual Time Calculation (min): 29 min   Short Term Goals: Week 4:  PT Short Term Goal 1 (Week 4): Pt will initiate bed mobility but require no more than totalA. PT Short Term Goal 2 (Week 4): Pt will maintain sitting balance unsupported for 30 seconds PT Short Term Goal 3 (Week 4): Pt will demonstrate command follow for ~15% of tasks.  Skilled Therapeutic Interventions/Progress Updates: Pt presents semi-reclined in bed, pt not following commands.  Pt not responding to directions for attention to Left.  Pt rolled side to side w/ dependence for brief change w/ A + 2.  Pt required total A for threading pants over feet.  Pt transferred sidelying to sit w/ dependence.  Pt sitting EOB w/ min A from + 2 for dependent doff gown and don pull-over shirt.  Pt transfers SPT w/ max A to total bed> TIS.  Pt positioned in w/c w/ full tray and TIS tilted back.       Therapy Documentation Precautions:  Precautions Precautions: Fall Precaution Comments: Contact precautions & delayed processing Restrictions Weight Bearing Restrictions: No General:   Vital Signs:   Pain:grimacing, unable to verbalize. Pain Assessment Pain Scale: Faces Faces Pain Scale: Hurts little more Pain Type: Acute pain   Therapy/Group: Individual Therapy  [name] 12/20/2022, 10:29 AM

## 2022-12-20 NOTE — Progress Notes (Signed)
Physical Therapy Session Note  Patient Details  Name: GUSTAVA GHOLAR MRN: [id number] Date of Birth: 03-11-1976  {CHL IP REHAB PT TIME CALCULATION:304800500}  Short Term Goals: {YNW:2956213}  Skilled Therapeutic Interventions/Progress Updates:      Therapy Documentation Precautions:  Precautions Precautions: Fall Precaution Comments: Contact precautions & delayed processing Restrictions Weight Bearing Restrictions: No General:   Vital Signs:   Pain: Pain Assessment Pain Scale: Faces Faces Pain Scale: Hurts little more Pain Type: Acute pain Mobility:   Locomotion :    Trunk/Postural Assessment :    Balance:   Exercises:   Other Treatments:      Therapy/Group: {Therapy/Group:3049007}  Loel Dubonnet 12/20/2022, 10:59 AM

## 2022-12-20 NOTE — Progress Notes (Signed)
PROGRESS NOTE   Subjective/Complaints: Patient's chart reviewed- No issues reported overnight Vitals signs stable  Tolerated therapy well yesterday   ROS: Unable to obtain due to cognitive/behavioral, + mild swelling of right wrist, +decreased initiation  Objective:   No results found. No results for input(s): "WBC", "HGB", "HCT", "PLT" in the last 72 hours.  No results for input(s): "NA", "K", "CL", "CO2", "GLUCOSE", "BUN", "CREATININE", "CALCIUM" in the last 72 hours.    Intake/Output Summary (Last 24 hours) at 12/20/2022 1039 Last data filed at 12/20/2022 4098 Gross per 24 hour  Intake 960 ml  Output --  Net 960 ml      Pressure Injury 10/31/22 Buttocks Left Unstageable - Full thickness tissue loss in which the base of the injury is covered by slough (yellow, tan, gray, green or brown) and/or eschar (tan, brown or black) in the wound bed. (Active)  10/31/22 2000  Location: Buttocks  Location Orientation: Left  Staging: Unstageable - Full thickness tissue loss in which the base of the injury is covered by slough (yellow, tan, gray, green or brown) and/or eschar (tan, brown or black) in the wound bed.  Wound Description (Comments):   Present on Admission: Yes     Pressure Injury 11/18/22 Toe (Comment  which one) Anterior;Right Deep Tissue Pressure Injury - Purple or maroon localized area of discolored intact skin or blood-filled blister due to damage of underlying soft tissue from pressure and/or shear. small are (Active)  11/18/22 1300  Location: Toe (Comment  which one)  Location Orientation: Anterior;Right  Staging: Deep Tissue Pressure Injury - Purple or maroon localized area of discolored intact skin or blood-filled blister due to damage of underlying soft tissue from pressure and/or shear.  Wound Description (Comments): small area on right great toe metatarsal  Present on Admission: Yes    Physical Exam: Vital  Signs Blood pressure 129/75, pulse 74, temperature 98.5 F (36.9 C), temperature source Oral, resp. rate 15, height 5\' 1"  (1.549 m), weight 47.9 kg, SpO2 98%. Gen: no distress, normal appearing HEENT: oral mucosa pink and moist, NCAT Cardio: Reg rate Chest: normal effort, normal rate of breathing Abd: soft, non-distended Ext: no edema Psych: pleasant, normal affect  Skin:  + Scaling dried skin on head - stable, stage 2 to right toe, area of redness between left thumb and index finger-covered in dressing, left buttock unstageable  Neurologic: Does not respond to orientation questions, does not track, does not follow cues. Does not follow verbal commands  Tone: MAS 3 right elbow, B knee flexors, and right hip; MAS 3 L elbow , MS 3-4 in wrist and finger flexors  Motor 0/5 strength in BUE and BUE  Cerebellar exam unable to assess  Musculoskeletal: reduced bilateral shoulder and elbow ROM due to increased tone  RIght Elbow brace in place Strong neck flexion Right wrist swelling   Assessment/Plan: 1. Functional deficits which require 3+ hours per day of interdisciplinary therapy in a comprehensive inpatient rehab setting. Physiatrist is providing close team supervision and 24 hour management of active medical problems listed below. Physiatrist and rehab team continue to assess barriers to discharge/monitor patient progress toward functional and medical goals  Care Tool:  Bathing    Body parts bathed by patient: Chest, Face   Body parts bathed by helper: Right arm, Left arm, Chest, Abdomen, Front perineal area, Buttocks, Right upper leg, Left upper leg, Right lower leg, Left lower leg, Face Body parts n/a: Right arm, Right upper leg, Left arm, Left upper leg, Chest, Abdomen, Right lower leg, Front perineal area, Left lower leg, Buttocks, Face   Bathing assist Assist Level: Dependent - Patient 0%     Upper Body Dressing/Undressing Upper body dressing   What is the patient wearing?:  Hospital gown only    Upper body assist Assist Level: Dependent - Patient 0%    Lower Body Dressing/Undressing Lower body dressing      What is the patient wearing?: Incontinence brief     Lower body assist Assist for lower body dressing: Dependent - Patient 0%     Toileting Toileting Toileting Activity did not occur (Clothing management and hygiene only): N/A (no void or bm)  Toileting assist Assist for toileting: Dependent - Patient 0%     Transfers Chair/bed transfer  Transfers assist  Chair/bed transfer activity did not occur: Safety/medical concerns  Chair/bed transfer assist level: Maximal Assistance - Patient 25 - 49%     Locomotion Ambulation   Ambulation assist   Ambulation activity did not occur: Safety/medical concerns          Walk 10 feet activity   Assist  Walk 10 feet activity did not occur: Safety/medical concerns        Walk 50 feet activity   Assist Walk 50 feet with 2 turns activity did not occur: Safety/medical concerns         Walk 150 feet activity   Assist Walk 150 feet activity did not occur: Safety/medical concerns         Walk 10 feet on uneven surface  activity   Assist Walk 10 feet on uneven surfaces activity did not occur: Safety/medical concerns         Wheelchair     Assist Is the patient using a wheelchair?: Yes Type of Wheelchair: Manual (tilt in space)    Wheelchair assist level: Dependent - Patient 0%      Wheelchair 50 feet with 2 turns activity    Assist    Wheelchair 50 feet with 2 turns activity did not occur: Safety/medical concerns   Assist Level: Dependent - Patient 0%   Wheelchair 150 feet activity     Assist  Wheelchair 150 feet activity did not occur: Safety/medical concerns   Assist Level: Dependent - Patient 0%   Blood pressure 129/75, pulse 74, temperature 98.5 F (36.9 C), temperature source Oral, resp. rate 15, height 5\' 1"  (1.549 m), weight 47.9 kg,  SpO2 98%.  Medical Problem List and Plan: 1. 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, frontal release signs (rooting reflex)  Fluctuating level of alertness             -patient may  shower  Elbow splint and PRAFOs ordered             -ELOS/Goals: min/modA but prognosis is guarded due to severe cognitive deficits goals 28-30d  -Continue CIR therapies including PT, OT, and SLP    Tasia Catchings bed ordered  Mother updated  Palliative care consulted, discussed home with hospice but patient is not a candidate  Discussed neuro exam with mom, no signs  of comprehension , very limited motor abilities due to 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  Impaired mobility: continue Lovenox             -antiplatelet therapy: N/A 3. Pain Management: Tylenol as needed 4. Mood/Behavior/Sleep: Provide emotional support             -antipsychotic agents: N/A  5. Neuropsych/cognition: This patient is not capable of making decisions on her own behalf. 6. Left buttock unstageable ulcer.  Continue Medihoney to buttock wound daily cover with foam dressing.  Changing dressing every 3 days or as needed soiling   7. Fluids/Electrolytes/Nutrition: Routine in and outs with follow-up chemistries  -Labs 7/21 stable.  -Eating 100% most meals with total assist  8.  Cavitary right lower lobe pneumonia likely aspiration pneumonia/MRSA pneumonia.  Resolved    Latest Ref Rng & Units 12/11/2022    7:15 AM 12/03/2022    7:18 AM 11/24/2022    6:25 AM  CBC  WBC 4.0 - 10.5 K/uL 9.0  8.7  5.5   Hemoglobin 12.0 - 15.0 g/dL 95.6  21.3  08.6   Hematocrit 36.0 - 46.0 % 45.1  39.7  38.5   Platelets 150 - 400 K/uL 329  218  249      9.  History of drug abuse.  Positive cocaine on urine drug screen.   Provide counseling  10.  AKI/hypovolemia and ATN.  resolved    Latest Ref Rng & Units 12/15/2022    7:12 AM 12/11/2022    7:15 AM 12/08/2022    6:21 AM  BMP  Glucose 70 - 99 mg/dL  578    BUN 6 - 20 mg/dL  13    Creatinine 4.69 - 1.00 mg/dL 6.29  5.28  4.13   Sodium 135 - 145 mmol/L  138    Potassium 3.5 - 5.1 mmol/L  3.5    Chloride 98 - 111 mmol/L  102    CO2 22 - 32 mmol/L  24    Calcium 8.9 - 10.3 mg/dL  9.2    Recheck labs tomorrow ordered  11.  Mild transaminitis with rhabdomyolysis.  Both resolved  12.  Hypotension. Resolved, d/c midodrine  13. Impaired initiation: continue amantadine back to 100mg  BID  14. Suboptimal vitamin D: continue 1,000U D3 daily  15. Spasticity:   -Remains extremely tight, decrease baclofen to 15mg  TID due to fatigue. Appears to have increased sedation with this increase, continue to monitor. Continue wrist, elbow brace, and PRAFOs. Add tizanidine 2mg  HS  16. Bowel and bladder incontinence: continue bowel and bladder program  -LBM 8/5   17. Left hand skin maceration: Eucerin ordered, discussed hand split with OT, conitnue  18. Tachycardia: mild,~90bpm decreased amantadine back to 100mg  BID, continue this dose  -8/6 improved HR stable in 80s     12/20/2022    4:17 AM 12/19/2022    7:57 PM 12/19/2022    1:19 PM  Vitals with BMI  Systolic 129 137 244  Diastolic 75 70 79  Pulse 74 75 80     19. History of elevated SBP: BP reviewed and is stable  20. Area of redness between left thumb and index finger: continue foam dressing  21. Scalp eczema: selsun blue ordered, continue  22. Fatigue: improved, continue baclofen 15mg  TID, B12 reviewed and is 335, in suboptimal range, will give 1,000U B12 injection 8/7  23. Right wrist swelling: XR  ordered and shows no acute fracture, shows 2.69mm ulnar variance, brace removed to given patient a break from it.   LOS: 32 days A FACE TO FACE EVALUATION WAS PERFORMED  Drema Pry  12/20/2022, 10:39 AM

## 2022-12-21 DIAGNOSIS — R1310 Dysphagia, unspecified: Secondary | ICD-10-CM

## 2022-12-21 MED ORDER — DICLOFENAC SODIUM 1 % EX GEL
2.0000 g | Freq: Four times a day (QID) | CUTANEOUS | Status: DC | PRN
  Administered 2022-12-21 – 2023-02-04 (×7): 2 g via TOPICAL
  Filled 2022-12-21: qty 100

## 2022-12-21 NOTE — Progress Notes (Signed)
Physical Therapy Session Note  Patient Details  Name: Nicole [name] MRN: [id number] Date of Birth: Feb 17, 1976  Today's Date: 12/21/2022 PT Individual Time: 1435-1533 PT Individual Time Calculation (min): 58 min   Short Term Goals: Week 5:  PT Short Term Goal 1 (Week 5): Pt will initiate bed mobility requiring no more than totalA. PT Short Term Goal 2 (Week 5): Pt will maintain sitting balance unsupported for up to 30 seconds PT Short Term Goal 3 (Week 5): Pt will demonstrate command follow for >10% of tasks. PT Short Term Goal 4 (Week 5): [name] RLE will demonstrate improved joint mobility into knee/ hip extension and ankle DF to neutral.  Skilled Therapeutic Interventions/Progress Updates: Patient reclined in bed on entrance to room. Patient alert and agreeable to PT session.   Therapeutic Activity: Bed Mobility: Pt performed supine<>sit on EOB with totalA. VC provided for pt to assist in mobility with no initiation.  Transfers: Pt performed squat pivot transfers throughout session with totalA. Pt noted to have a soiled brief at end of session (nsg notified to assist in personal care).   Neuromuscular Re-ed: NMR facilitated during session with focus on proprioceptive feedback in standing. - pt on edge of mat with focus on truncal stability while static sitting. Pt cued to true to control trunk from falling posteriorly or laterally with PTA on R side providing totalA. PTA used visual stimulus on phone for patient to engage head rotation (noted eye track to phone only to L, but not to R). Pt also cued to move R UE outwards to touch PTA hand (noted some initiated movement, but not much).   NMR performed for improvements in motor control and coordination, balance, sequencing, judgement, and self confidence/ efficacy in performing all aspects of mobility at highest level of independence.   Patient reclined in bed at end of session with brakes locked, bed alarm set, and all needs within reach.       Therapy Documentation Precautions:  Precautions Precautions: Fall Precaution Comments: Contact precautions & delayed processing Restrictions Weight Bearing Restrictions: No   Therapy/Group: Individual Therapy    PTA 12/21/2022, 3:58 PM

## 2022-12-21 NOTE — Progress Notes (Signed)
PROGRESS NOTE   Subjective/Complaints: Patient's chart reviewed- No issues reported overnight Vitals signs stable except for intermittent elevated SBP   ROS: Unable to obtain due to cognitive/behavioral, + mild swelling of right wrist, +decreased initiation  Objective:   No results found. Recent Labs    12/20/22 1054  WBC 5.6  HGB 14.0  HCT 42.8  PLT 253    Recent Labs    12/20/22 1054  NA 137  K 3.9  CL 101  CO2 22  GLUCOSE 107*  BUN 9  CREATININE 0.53  CALCIUM 9.1      Intake/Output Summary (Last 24 hours) at 12/21/2022 1055 Last data filed at 12/21/2022 0843 Gross per 24 hour  Intake 831 ml  Output --  Net 831 ml      Pressure Injury 10/31/22 Buttocks Left Unstageable - Full thickness tissue loss in which the base of the injury is covered by slough (yellow, tan, gray, green or brown) and/or eschar (tan, brown or black) in the wound bed. (Active)  10/31/22 2000  Location: Buttocks  Location Orientation: Left  Staging: Unstageable - Full thickness tissue loss in which the base of the injury is covered by slough (yellow, tan, gray, green or brown) and/or eschar (tan, brown or black) in the wound bed.  Wound Description (Comments):   Present on Admission: Yes     Pressure Injury 11/18/22 Toe (Comment  which one) Anterior;Right Deep Tissue Pressure Injury - Purple or maroon localized area of discolored intact skin or blood-filled blister due to damage of underlying soft tissue from pressure and/or shear. small are (Active)  11/18/22 1300  Location: Toe (Comment  which one)  Location Orientation: Anterior;Right  Staging: Deep Tissue Pressure Injury - Purple or maroon localized area of discolored intact skin or blood-filled blister due to damage of underlying soft tissue from pressure and/or shear.  Wound Description (Comments): small area on right great toe metatarsal  Present on Admission: Yes     Physical Exam: Vital Signs Blood pressure 108/74, pulse 81, temperature 98.8 F (37.1 C), temperature source Oral, resp. rate 16, height 5\' 1"  (1.549 m), weight 44.8 kg, SpO2 98%. Gen: no distress, normal appearing HEENT: oral mucosa pink and moist, NCAT Cardio: Reg rate Chest: normal effort, normal rate of breathing Abd: soft, non-distended Ext: no edema Psych: pleasant, normal affect  Skin:  + Scaling dried skin on head - stable, stage 2 to right toe, area of redness between left thumb and index finger-covered in dressing, left buttock unstageable  Neurologic: Does not respond to orientation questions, does not track, does not follow cues. Does not follow verbal commands  Tone: MAS 3 right elbow, B knee flexors, and right hip; MAS 3 L elbow , MS 3-4 in wrist and finger flexors  Motor 0/5 strength in BUE and BUE  Cerebellar exam unable to assess  Musculoskeletal: reduced bilateral shoulder and elbow ROM due to increased tone  RIght Elbow brace in place Strong neck flexion Right wrist swelling   Assessment/Plan: 1. Functional deficits which require 3+ hours per day of interdisciplinary therapy in a comprehensive inpatient rehab setting. Physiatrist is providing close team supervision and 24 hour management of active  medical problems listed below. Physiatrist and rehab team continue to assess barriers to discharge/monitor patient progress toward functional and medical goals  Care Tool:  Bathing    Body parts bathed by patient: Chest, Face   Body parts bathed by helper: Right arm, Left arm, Chest, Abdomen, Front perineal area, Buttocks, Right upper leg, Left upper leg, Right lower leg, Left lower leg, Face Body parts n/a: Right arm, Right upper leg, Left arm, Left upper leg, Chest, Abdomen, Right lower leg, Front perineal area, Left lower leg, Buttocks, Face   Bathing assist Assist Level: Dependent - Patient 0%     Upper Body Dressing/Undressing Upper body dressing    What is the patient wearing?: Hospital gown only    Upper body assist Assist Level: Dependent - Patient 0%    Lower Body Dressing/Undressing Lower body dressing      What is the patient wearing?: Incontinence brief     Lower body assist Assist for lower body dressing: Dependent - Patient 0%     Toileting Toileting Toileting Activity did not occur (Clothing management and hygiene only): N/A (no void or bm)  Toileting assist Assist for toileting: Dependent - Patient 0%     Transfers Chair/bed transfer  Transfers assist  Chair/bed transfer activity did not occur: Safety/medical concerns  Chair/bed transfer assist level: Maximal Assistance - Patient 25 - 49%     Locomotion Ambulation   Ambulation assist   Ambulation activity did not occur: Safety/medical concerns          Walk 10 feet activity   Assist  Walk 10 feet activity did not occur: Safety/medical concerns        Walk 50 feet activity   Assist Walk 50 feet with 2 turns activity did not occur: Safety/medical concerns         Walk 150 feet activity   Assist Walk 150 feet activity did not occur: Safety/medical concerns         Walk 10 feet on uneven surface  activity   Assist Walk 10 feet on uneven surfaces activity did not occur: Safety/medical concerns         Wheelchair     Assist Is the patient using a wheelchair?: Yes Type of Wheelchair: Manual (tilt in space)    Wheelchair assist level: Dependent - Patient 0%      Wheelchair 50 feet with 2 turns activity    Assist    Wheelchair 50 feet with 2 turns activity did not occur: Safety/medical concerns   Assist Level: Dependent - Patient 0%   Wheelchair 150 feet activity     Assist  Wheelchair 150 feet activity did not occur: Safety/medical concerns   Assist Level: Dependent - Patient 0%   Blood pressure 108/74, pulse 81, temperature 98.8 F (37.1 C), temperature source Oral, resp. rate 16, height 5'  1" (1.549 m), weight 44.8 kg, SpO2 98%.  Medical Problem List and Plan: 1. 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  Impaired mobility: continue Lovenox, weekly creatinine ordered             -antiplatelet therapy: N/A 3. Pain Management: Tylenol as needed 4. Mood/Behavior/Sleep: Provide emotional support             -antipsychotic agents: N/A  5. Neuropsych/cognition: This patient is not capable of making decisions on her own behalf. 6. Left buttock unstageable ulcer.  Continue Medihoney to buttock wound daily cover with foam dressing.  Changing dressing every 3 days or as needed soiling   7. Fluids/Electrolytes/Nutrition: Routine in and outs with follow-up chemistries  -Labs 7/21 stable.  -Eating 100% most meals with total assist  8.  Cavitary right lower lobe pneumonia likely aspiration pneumonia/MRSA pneumonia.  Resolved    Latest Ref Rng & Units 12/20/2022   10:54 AM 12/11/2022    7:15 AM 12/03/2022    7:18 AM  CBC  WBC 4.0 - 10.5 K/uL 5.6  9.0  8.7   Hemoglobin 12.0 - 15.0 g/dL 95.6  21.3  08.6   Hematocrit 36.0 - 46.0 % 42.8  45.1  39.7   Platelets 150 - 400 K/uL 253   329  218      9.  History of drug abuse.  Positive cocaine on urine drug screen.  Provide counseling  10.  AKI/hypovolemia and ATN.  resolved    Latest Ref Rng & Units 12/20/2022   10:54 AM 12/15/2022    7:12 AM 12/11/2022    7:15 AM  BMP  Glucose 70 - 99 mg/dL 578   469   BUN 6 - 20 mg/dL 9   13   Creatinine 6.29 - 1.00 mg/dL 5.28  4.13  2.44   Sodium 135 - 145 mmol/L 137   138   Potassium 3.5 - 5.1 mmol/L 3.9   3.5   Chloride 98 - 111 mmol/L 101   102   CO2 22 - 32 mmol/L 22   24   Calcium 8.9 - 10.3 mg/dL 9.1   9.2   Recheck labs tomorrow ordered  11.  Mild transaminitis with rhabdomyolysis.  Both resolved  12.  Hypotension. Resolved, d/c midodrine  13. Impaired initiation: continue amantadine back to 100mg  BID  14. Suboptimal vitamin D: continue 1,000U D3 daily  15. Spasticity:   -Remains extremely tight, decrease baclofen to 15mg  TID due to fatigue. Appears to have increased sedation with this increase, continue to monitor. Continue wrist, elbow brace, and PRAFOs. Add tizanidine 2mg  HS  16. Bowel and bladder incontinence: continue bowel and bladder program  -LBM 8/5   17. Left hand skin maceration: Eucerin ordered, discussed hand split with OT, conitnue  18. Tachycardia: mild,~90bpm decreased amantadine back to 100mg  BID, continue this dose  -8/6 improved HR stable in 80s     12/21/2022    7:00 AM 12/20/2022    7:54 PM 12/20/2022    1:23 PM  Vitals with BMI  Systolic 108 149 010  Diastolic 74 87 83  Pulse 81 80 77     19. History of elevated SBP: BP reviewed and is stable  20. Area of redness between left thumb and index finger: continue  foam dressing  21. Scalp eczema: selsun blue ordered, continue  22. Fatigue: improved, continue baclofen 15mg  TID, B12 reviewed and is 335, in suboptimal range, will give 1,000U B12 injection 8/7  23. Right wrist swelling: XR ordered and shows no acute fracture, shows 2.26mm ulnar variance, brace removed to given patient a break  from it. Voltaren gel ordered prn for discomfort  23. Dysphagia: continue D3 diet  LOS: 33 days A FACE TO FACE EVALUATION WAS PERFORMED  Drema Pry  12/21/2022, 10:55 AM

## 2022-12-21 NOTE — Progress Notes (Signed)
Speech Language Pathology Daily Session Note  Patient Details  Name: Nicole [name] MRN: [id number] Date of Birth: 01-23-1976  Today's Date: 12/21/2022 SLP Individual Time: 1300-1400 SLP Individual Time Calculation (min): 60 min  Short Term Goals: Week 5: SLP Short Term Goal 1 (Week 5): Pt will present w/ appropriate response to functional stimuli (i.e. accept spoon, grasp basic item appropriately, response to tactile/auditory stimuli) 15% of the time w/ maxA multimodal cues SLP Short Term Goal 2 (Week 5): Patient will follow 1-step commands within a context in 5% of opportunities with Max A multimodal cues. SLP Short Term Goal 3 (Week 5): Patient will utilize multimodal communication to answer basic yes/no questions with maxA in 5% of opportunities  Skilled Therapeutic Interventions:   Pt greeted at bedside w/ RNT present and assisting pt w/ noon meal. Pt consumed ~75% of her meal upon SLP arrival so assisted to provide her w/ the remainder of the meal. Attempted to initiate hand over hand assist to self feed, though pt pulled her RUE away x2. SLP provided totalA for remainder of meal. No overt s/s of airway invasion noted throughout PO intake. SLP provided pt w/ functional following directions task re facial movements. Pt appeared to present w/ purposeful attempts to complete each direction as consistent groping was noted during 5/5 directions provided. She was able to complete 1/5 directions adequately as she opened her mouth on command. Similar to prev tx session, she consistently looked in the direction of the SLP and was able to track SLP to midline. Of note, SLP was on pt's L side throughout tx tasks and L visual attention > R at this time. SLP then facilitated functional diagnostic tx tasks re multimodal communication for Y/N responses. Pt was able to gaze at Y and N x1 though unable to alternate gaze to determine true response when provided w/ biographical info questions x2. Attempted thumbs  up/down, hand squeeze, and leg tap as well to ID Y/N responses. Pt demonstrated adequate grip of marker during writing task, though unable to write or trace any letters/shapes w/o hand over hand assist from SLP. When provided w/ pt preferred music, she demonstrated reduced tension. She was able to maintain eye contact w/ SLP and appeared to present w/ awareness of SLP conversation, education, and encouragement. SLP provided encouragement re noted continued attempts to follow commands and assist today. She was left in the bed with the alarm set and call light within reach. Recommend cont ST per POC.   Pain  Unable to report - appeared comfortable  Therapy/Group: Individual Therapy  [name] 12/21/2022, 2:39 PM

## 2022-12-22 NOTE — Progress Notes (Signed)
PROGRESS NOTE   Subjective/Complaints: Patient's chart reviewed- No issues reported overnight Vitals signs stable  Tolerated therapy yesterday   ROS: Unable to obtain due to cognitive/behavioral, + mild swelling of right wrist, +decreased initiation- continues  Objective:   No results found. Recent Labs    12/20/22 1054  WBC 5.6  HGB 14.0  HCT 42.8  PLT 253    Recent Labs    12/20/22 1054  NA 137  K 3.9  CL 101  CO2 22  GLUCOSE 107*  BUN 9  CREATININE 0.53  CALCIUM 9.1      Intake/Output Summary (Last 24 hours) at 12/22/2022 1048 Last data filed at 12/22/2022 0805 Gross per 24 hour  Intake 1061 ml  Output --  Net 1061 ml      Pressure Injury 10/31/22 Buttocks Left Unstageable - Full thickness tissue loss in which the base of the injury is covered by slough (yellow, tan, gray, green or brown) and/or eschar (tan, brown or black) in the wound bed. (Active)  10/31/22 2000  Location: Buttocks  Location Orientation: Left  Staging: Unstageable - Full thickness tissue loss in which the base of the injury is covered by slough (yellow, tan, gray, green or brown) and/or eschar (tan, brown or black) in the wound bed.  Wound Description (Comments):   Present on Admission: Yes     Pressure Injury 11/18/22 Toe (Comment  which one) Anterior;Right Deep Tissue Pressure Injury - Purple or maroon localized area of discolored intact skin or blood-filled blister due to damage of underlying soft tissue from pressure and/or shear. small are (Active)  11/18/22 1300  Location: Toe (Comment  which one)  Location Orientation: Anterior;Right  Staging: Deep Tissue Pressure Injury - Purple or maroon localized area of discolored intact skin or blood-filled blister due to damage of underlying soft tissue from pressure and/or shear.  Wound Description (Comments): small area on right great toe metatarsal  Present on Admission: Yes     Physical Exam: Vital Signs Blood pressure 124/85, pulse 77, temperature 97.7 F (36.5 C), temperature source Oral, resp. rate 18, height 5\' 1"  (1.549 m), weight 44.8 kg, SpO2 98%. Gen: no distress, normal appearing HEENT: oral mucosa pink and moist, NCAT Cardio: Reg rate Chest: normal effort, normal rate of breathing Abd: soft, non-distended Ext: no edema Psych: pleasant, decreased attention  Skin:  + Scaling dried skin on head - stable, stage 2 to right toe, area of redness between left thumb and index finger-covered in dressing, left buttock unstageable  Neurologic: Does not respond to orientation questions, does not track, does not follow cues. Does not follow verbal commands  Tone: MAS 3 right elbow, B knee flexors, and right hip; MAS 3 L elbow , MS 3-4 in wrist and finger flexors  Motor 0/5 strength in BUE and BUE  Cerebellar exam unable to assess  Musculoskeletal: reduced bilateral shoulder and elbow ROM due to increased tone  RIght Elbow brace in place Strong neck flexion Right wrist swelling   Assessment/Plan: 1. Functional deficits which require 3+ hours per day of interdisciplinary therapy in a comprehensive inpatient rehab setting. Physiatrist is providing close team supervision and 24 hour management of active  medical problems listed below. Physiatrist and rehab team continue to assess barriers to discharge/monitor patient progress toward functional and medical goals  Care Tool:  Bathing    Body parts bathed by patient: Chest, Face   Body parts bathed by helper: Right arm, Left arm, Chest, Abdomen, Front perineal area, Buttocks, Right upper leg, Left upper leg, Right lower leg, Left lower leg, Face Body parts n/a: Right arm, Right upper leg, Left arm, Left upper leg, Chest, Abdomen, Right lower leg, Front perineal area, Left lower leg, Buttocks, Face   Bathing assist Assist Level: Dependent - Patient 0%     Upper Body Dressing/Undressing Upper body dressing    What is the patient wearing?: Hospital gown only    Upper body assist Assist Level: Dependent - Patient 0%    Lower Body Dressing/Undressing Lower body dressing      What is the patient wearing?: Incontinence brief     Lower body assist Assist for lower body dressing: Dependent - Patient 0%     Toileting Toileting Toileting Activity did not occur (Clothing management and hygiene only): N/A (no void or bm)  Toileting assist Assist for toileting: Dependent - Patient 0%     Transfers Chair/bed transfer  Transfers assist  Chair/bed transfer activity did not occur: Safety/medical concerns  Chair/bed transfer assist level: Maximal Assistance - Patient 25 - 49%     Locomotion Ambulation   Ambulation assist   Ambulation activity did not occur: Safety/medical concerns          Walk 10 feet activity   Assist  Walk 10 feet activity did not occur: Safety/medical concerns        Walk 50 feet activity   Assist Walk 50 feet with 2 turns activity did not occur: Safety/medical concerns         Walk 150 feet activity   Assist Walk 150 feet activity did not occur: Safety/medical concerns         Walk 10 feet on uneven surface  activity   Assist Walk 10 feet on uneven surfaces activity did not occur: Safety/medical concerns         Wheelchair     Assist Is the patient using a wheelchair?: Yes Type of Wheelchair: Manual (tilt in space)    Wheelchair assist level: Dependent - Patient 0%      Wheelchair 50 feet with 2 turns activity    Assist    Wheelchair 50 feet with 2 turns activity did not occur: Safety/medical concerns   Assist Level: Dependent - Patient 0%   Wheelchair 150 feet activity     Assist  Wheelchair 150 feet activity did not occur: Safety/medical concerns   Assist Level: Dependent - Patient 0%   Blood pressure 124/85, pulse 77, temperature 97.7 F (36.5 C), temperature source Oral, resp. rate 18, height 5'  1" (1.549 m), weight 44.8 kg, SpO2 98%.  Medical Problem List and Plan: 1. 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  Impaired mobility: continue Lovenox, weekly creatinine ordered             -antiplatelet therapy: N/A 3. Pain Management: Tylenol as needed 4. Mood/Behavior/Sleep: Provide emotional support             -antipsychotic agents: N/A  5. Neuropsych/cognition: This patient is not capable of making decisions on her own behalf. 6. Left buttock unstageable ulcer.  Continue Medihoney to buttock wound daily cover with foam dressing.  Changing dressing every 3 days or as needed soiling   7. Fluids/Electrolytes/Nutrition: Routine in and outs with follow-up chemistries  -Labs 7/21 stable.  -Eating 100% most meals with total assist  8.  Cavitary right lower lobe pneumonia likely aspiration pneumonia/MRSA pneumonia.  Resolved    Latest Ref Rng & Units 12/20/2022   10:54 AM 12/11/2022    7:15 AM 12/03/2022    7:18 AM  CBC  WBC 4.0 - 10.5 K/uL 5.6  9.0  8.7   Hemoglobin 12.0 - 15.0 g/dL 16.1  09.6  04.5   Hematocrit 36.0 - 46.0 % 42.8  45.1  39.7   Platelets 150 - 400 K/uL 253   329  218      9.  History of drug abuse.  Positive cocaine on urine drug screen.  Provide counseling  10.  AKI/hypovolemia and ATN.  resolved    Latest Ref Rng & Units 12/20/2022   10:54 AM 12/15/2022    7:12 AM 12/11/2022    7:15 AM  BMP  Glucose 70 - 99 mg/dL 409   811   BUN 6 - 20 mg/dL 9   13   Creatinine 9.14 - 1.00 mg/dL 7.82  9.56  2.13   Sodium 135 - 145 mmol/L 137   138   Potassium 3.5 - 5.1 mmol/L 3.9   3.5   Chloride 98 - 111 mmol/L 101   102   CO2 22 - 32 mmol/L 22   24   Calcium 8.9 - 10.3 mg/dL 9.1   9.2   Recheck labs tomorrow ordered  11.  Mild transaminitis with rhabdomyolysis.  Both resolved  12.  Hypotension. Resolved, d/c midodrine  13. Impaired initiation: continue amantadine back to 100mg  BID  14. Suboptimal vitamin D: continue 1,000U D3 daily  15. Spasticity:   -Remains extremely tight, decrease baclofen to 15mg  TID due to fatigue. Appears to have increased sedation with this increase, continue to monitor. Continue wrist, elbow brace, and PRAFOs. Add tizanidine 2mg  HS  16. Bowel and bladder incontinence: continue bowel and bladder program  -LBM 8/5   17. Left hand skin maceration: Eucerin ordered, discussed hand split with OT, conitnue  18. Tachycardia: mild,~90bpm decreased amantadine back to 100mg  BID, continue this doseHR stable in 80s     12/22/2022    3:32 AM 12/21/2022    7:24 PM 12/21/2022    3:51 PM  Vitals with BMI  Systolic 124 106 086  Diastolic 85 87 72  Pulse 77 86 72     19. History of elevated SBP: BP reviewed and is stable  20. Area of redness between left thumb and index finger: continue foam dressing  21.  Scalp eczema: selsun blue ordered, continue  22. Fatigue: improved, continue baclofen 15mg  TID, B12 reviewed and is 335, in suboptimal range, will give 1,000U B12 injection 8/7  23. Right wrist swelling: XR ordered and shows no acute fracture, shows 2.26mm ulnar variance, brace removed to given patient a break from it. Voltaren  gel ordered prn for discomfort, continue  23. Dysphagia: continue D3 diet  LOS: 34 days A FACE TO FACE EVALUATION WAS PERFORMED  Drema Pry  12/22/2022, 10:48 AM

## 2022-12-22 NOTE — Progress Notes (Signed)
Occupational Therapy Session Note  Patient Details  Name: SALY HOSKIN MRN: [id number] Date of Birth: 1976-02-04  Today's Date: 12/22/2022 OT Individual Time: 4098-1191 OT Individual Time Calculation (min): 57 min    Short Term Goals: Week 5:  OT Short Term Goal 1 (Week 5): Patient will sit unsupported at edge of bed x 15 seconds in preparation for level surface transfer OT Short Term Goal 2 (Week 5): Patient will demonstrate improved focused attention as evidenced by sustaining eye contact x at least 10 seconds while in supported position. OT Short Term Goal 3 (Week 5): Patient will demonstrate improved passive/active range of motion in right fingers to allow improved functional use of RUE for participation in BADL  Skilled Therapeutic Interventions/Progress Updates:    Patient received seated in recliner.  Patient with head down despite chest strap, looks very sleepy.  Patient transported to dayroom to address trunk and head control and to work to improve patient's ability to respond to stimulus.  Sat at edge of mat table with padded plinth in front for Ue's.  Patient resting Ue's on plinth with mod assist for positioning.  Worked to align head over trunk in sititng with max assist.  Placed electronic keyboard in front of patient in attempt to have patient strike keys volitionally.  Hand over hand guiding patient to keyboard - striking key passively.  No visible response.  Repeated multiple times without response.  Transported patient back to room as brief soiled with formed stool.  Worked on rolling left and right to aide with hygiene.  Brief changed, then immediately noticed brief soaked with urine.  Again rolled left and right to clean and replace with dry brief.   Patient left supine in bed.  Alarm engaged, and elbow brace placed on left elbow.  Patient said "Ouch" softly when left arm stretched to place in brace.  PRAFO's don, and attempted to place R resting hand splint - but patient bringing  splint to mouth.  Removed hand brace and placed R mitt    Therapy Documentation Precautions:  Precautions Precautions: Fall Precaution Comments: Contact precautions & delayed processing Restrictions Weight Bearing Restrictions: No   Pain:  Discomfort in BUE with ROM    Therapy/Group: Individual Therapy  [name] 12/22/2022, 3:03 PM

## 2022-12-22 NOTE — NC FL2 (Signed)
[place] MEDICAID FL2 LEVEL OF CARE FORM     IDENTIFICATION  Patient Name: Nicole [name] Birthdate: 1976-02-23 Sex: female Admission Date (Current Location): 11/18/2022  [place] and [place] Number:  Haynes Bast [id number] S Facility and Address:  [facility], [address], [place], [place] [address]      Provider Number: [id number]  Attending Physician Name and Address:  Horton Chin, MD  Relative Name and Phone Number:  Maude Leriche [phone]    Current Level of Care: Other (Comment) (Rehab) Recommended Level of Care: Skilled Nursing Facility Prior Approval Number:    Date Approved/Denied:   PASRR Number: [id number]  Discharge Plan: SNF    Current Diagnoses: Patient Active Problem List   Diagnosis Date Noted   Hypoxic brain injury (HCC) 11/18/2022   Cavitary pneumonia 11/05/2022   Substance use 11/05/2022   Acute respiratory failure (HCC) 10/31/2022   Nonallergic rhinitis 08/23/2020   Mild persistent asthma without complication 06/14/2020   Other allergic rhinitis 06/14/2020   Adverse reaction to food, subsequent encounter 06/14/2020   IBS (irritable bowel syndrome) 05/24/2016   Chronic venous insufficiency 05/24/2016   Migraine 05/24/2016   Pulmonary nodules 10/27/2014   Hepatitis C, chronic (HCC)    Depression 10/09/2013   OSA on CPAP - sees Dr. Jenne Campus in ENT 10/09/2013   Allergic rhinitis 05/30/2012   Cough variant asthma 02/08/2012    Orientation RESPIRATION BLADDER Height & Weight        Normal Incontinent Weight: 98 lb 12.3 oz (44.8 kg) Height:  5\' 1"  (154.9 cm)  BEHAVIORAL SYMPTOMS/MOOD NEUROLOGICAL BOWEL NUTRITION STATUS      Incontinent Diet (Dys 3 thin liquids)  AMBULATORY STATUS COMMUNICATION OF NEEDS Skin   Total Care Does not communicate PU Stage and Appropriate Care   PU Stage 2 Dressing: Daily                   Personal Care Assistance Level of Assistance  Total care Bathing Assistance: Maximum  assistance Feeding assistance: Maximum assistance Dressing Assistance: Maximum assistance Total Care Assistance: Maximum assistance   Functional Limitations Info  Sight, Speech Sight Info: Impaired   Speech Info: Impaired    SPECIAL CARE FACTORS FREQUENCY  Bowel and bladder program, PT (By licensed PT), OT (By licensed OT), Speech therapy     PT Frequency: 2-3 x week OT Frequency: 2-3 x week Bowel and Bladder Program Frequency: Timed toileting 3-4 hours   Speech Therapy Frequency: 2-3 x week      Contractures Contractures Info: Not present    Additional Factors Info  Code Status, Allergies, Isolation Precautions Code Status Info: DNR Allergies Info: [place] Nuts, Cashews Nuts, Ceclor Nickel TRiptans     Isolation Precautions Info: MRSA     Current Medications (12/22/2022):  This is the current hospital active medication list Current Facility-Administered Medications  Medication Dose Route Frequency Provider Last Rate Last Admin   acetaminophen (TYLENOL) tablet 650 mg  650 mg Oral Q6H PRN Charlton Amor, PA-C   650 mg at 12/07/22 1135   amantadine (SYMMETREL) capsule 100 mg  100 mg Oral BID Raulkar, Drema Pry, MD   100 mg at 12/22/22 1243   baclofen (LIORESAL) tablet 15 mg  15 mg Oral TID Horton Chin, MD   15 mg at 12/22/22 1023   cholecalciferol (VITAMIN D3) 25 MCG (1000 UNIT) tablet 1,000 Units  1,000 Units Oral Daily Raulkar, Drema Pry, MD   1,000 Units at 12/22/22 1023   diclofenac  Sodium (VOLTAREN) 1 % topical gel 2 g  2 g Topical QID PRN Raulkar, Drema Pry, MD   2 g at 12/21/22 1258   enoxaparin (LOVENOX) injection 40 mg  40 mg Subcutaneous Q24H AngiulliMcarthur Rossetti, PA-C   40 mg at 12/21/22 1726   gadobutrol (GADAVIST) 1 MMOL/ML injection 5 mL  5 mL Intravenous Once PRN Raulkar, Drema Pry, MD       Gerhardt's butt cream   Topical BID Jacquelynn Cree, PA-C   Given at 12/22/22 1024   hydrocerin (EUCERIN) cream   Topical BID Horton Chin, MD   Given at  12/22/22 1029   leptospermum manuka honey (MEDIHONEY) paste 1 Application  1 Application Topical Daily Charlton Amor, PA-C   1 Application at 12/22/22 1029   multivitamin with minerals tablet 1 tablet  1 tablet Oral Daily Charlton Amor, PA-C   1 tablet at 12/22/22 1023   selenium sulfide (SELSUN) 1 % shampoo   Topical PRN Raulkar, Drema Pry, MD       tiZANidine (ZANAFLEX) tablet 6 mg  6 mg Oral QHS Raulkar, Drema Pry, MD   6 mg at 12/21/22 2208     Discharge Medications: Please see discharge summary for a list of discharge medications.  Relevant Imaging Results:  Relevant Lab Results:   Additional Information SSN: [id number]  , Lemar Livings, LCSW

## 2022-12-22 NOTE — Progress Notes (Signed)
Physical Therapy Session Note  Patient Details  Name: Nicole [name] MRN: [id number] Date of Birth: 08-03-1975  Today's Date: 12/22/2022 PT Individual Time: 0851-1002 PT Individual Time Calculation (min): 71 min   Short Term Goals: Week 3:  PT Short Term Goal 1 (Week 3): Pt will initiate bed mobility but require no more than totalA PT Short Term Goal 1 - Progress (Week 3): Not met PT Short Term Goal 2 (Week 3): Pt will maintain sitting balance unsupported for 30 seconds PT Short Term Goal 2 - Progress (Week 3): Not met PT Short Term Goal 3 (Week 3): Pt will demonstrate command follow for ~15% of tasks. PT Short Term Goal 3 - Progress (Week 3): Not met Week 4:  PT Short Term Goal 1 (Week 4): Pt will initiate bed mobility but require no more than totalA. PT Short Term Goal 1 - Progress (Week 4): Not met PT Short Term Goal 2 (Week 4): Pt will maintain sitting balance unsupported for 30 seconds PT Short Term Goal 2 - Progress (Week 4): Progressing toward goal PT Short Term Goal 3 (Week 4): Pt will demonstrate command follow for ~15% of tasks. PT Short Term Goal 3 - Progress (Week 4): Not met Week 5:  PT Short Term Goal 1 (Week 5): Pt will initiate bed mobility requiring no more than totalA. PT Short Term Goal 2 (Week 5): Pt will maintain sitting balance unsupported for up to 30 seconds PT Short Term Goal 3 (Week 5): Pt will demonstrate command follow for >10% of tasks. PT Short Term Goal 4 (Week 5): [name] RLE will demonstrate improved joint mobility into knee/ hip extension and ankle DF to neutral.  Skilled Therapeutic Interventions/Progress Updates:  Patient supine in bed on entrance to room.  Head fully flexed forward d/t pillow and HOB raised. Patient awake/ alert and demos turn of eyes to L briefly with additional head turn upon verbal recognition of pt from therapist to her L.   Slight look of irritation and pt's hair combed from her face and put up and out of her face. [name] brief also wet  and self care performed with DEP. New brief donned.    Patient with no indication of pain at start of session.    Attempt to engage pt throughout session with conversation.    Therapeutic Activity/ NMR: Transfers: Pt performed squat pivot from bed to w/c with TotA/ DEP. Provided vc throughout for technique.   NMR:  Increased RLE ankle PF and knee flexion with difficulty positioning leg into extension. LLE also positions into flexion with foot drop when not weight bearing. So session spent in standing frame with attempt for recognition of items on table top while positioning of pt with upright posture holding shoulders back with tactile stim applied to scapular retractors and stretch provided to pectoralis musculature. Head held in upright position and guided into lateral flexion and rotations L/ R. Provided vc throughout for required movements with tactile stim to relevant musculature.   NMR performed for improvements in motor control, cognition, and maintaining body mobility for future purposeful movements.    Seated LLE stretch into knee extension and ankle DF to prevent contracture.   Attempt to engage pt in head control/ eye mvmt toward conscious acknowledgement of items in front of her. No changes in position/ awareness noted throughout.    Bed Mobility: Squat pivot from w/c to seated positoin on EOB. Sit>supine performed with DEP. Pt positioned in bed with flat pillow for improved neck  extension positioning, PRAFOs donned for DF positioning, mitt donned to R hand to protect skin integrity on head/ neck d/t increased scratching.    All needs within reach. NT notified as to pt's disposition.  Therapy Documentation Precautions:  Precautions Precautions: Fall Precaution Comments: Contact precautions & delayed processing Restrictions Weight Bearing Restrictions: No  Pain: Pain Assessment Pain Scale: Faces Pain Score: 0-No pain indicated this session. Improving in tolerating ranging  of body.   Therapy/Group: Individual Therapy  Loel Dubonnet PT, DPT, CSRS 12/22/2022, 10:28 AM

## 2022-12-22 NOTE — Progress Notes (Signed)
Patient ID: Nicole [name], female   DOB: 1976-04-30, 47 y.o.   MRN: [id number]  Spoke with Mom to update weekly progress and team conference. Guardian Ad-litem was here to evaluate pt and speak with. It was a very short visit. Mom needs FL2 have done an updated one and will try to get medicaid pending number for her. Will continue to work on getting funding to get pt into [employer].

## 2022-12-22 NOTE — Progress Notes (Signed)
Speech Language Pathology Daily Session Note  Patient Details  Name: Nicole [name] MRN: [id number] Date of Birth: 05/21/1975  Today's Date: 12/22/2022 SLP Individual Time: 1415-1450 SLP Individual Time Calculation (min): 35 min  Short Term Goals: Week 5: SLP Short Term Goal 1 (Week 5): Pt will present w/ appropriate response to functional stimuli (i.e. accept spoon, grasp basic item appropriately, response to tactile/auditory stimuli) 15% of the time w/ maxA multimodal cues SLP Short Term Goal 2 (Week 5): Patient will follow 1-step commands within a context in 5% of opportunities with Max A multimodal cues. SLP Short Term Goal 3 (Week 5): Patient will utilize multimodal communication to answer basic yes/no questions with maxA in 5% of opportunities  Skilled Therapeutic Interventions:   Pt greeted at bedside. She was asleep upon SLP arrival and mildly difficult to wake. She benefited from a sternal rub and wet washcloth to facilitate eye opening. Once awake, she was able to remain awake for ~10 mins, maintain eye contact w/ SLP, and track her to midline. She was provided w/ the same facial directions from previous tx session. She INDly completed 0/5 commands, though groping/attempted movement remained evident during 3/5 commands. She required frequent cues to remain awake after that and benefited from hand over hand assist to tap along to the beat with pt preferred music. Tx tasks discontinued 2* pt fatigue/limited arousal. At the end of the tx session, she was again able to track SLP around the room with her eyes. She also moaned as SLP was headed towards the door: suspect potential attempt at purposeful vocalization, though unable to confirm. She was left in her bed with the alarm set and call light within reach. Recommend cont ST per POC.   Pain  Unable to report pain - appeared comfortable   Therapy/Group: Individual Therapy  [name] 12/22/2022, 3:09 PM

## 2022-12-23 MED ORDER — DOCUSATE SODIUM 100 MG PO CAPS
100.0000 mg | ORAL_CAPSULE | Freq: Every day | ORAL | Status: DC
  Administered 2022-12-23 – 2022-12-25 (×3): 100 mg via ORAL
  Filled 2022-12-23 (×4): qty 1

## 2022-12-23 NOTE — Progress Notes (Signed)
PROGRESS NOTE   Subjective/Complaints:  Pt nonverbal- in mittens- not able to communicate Per staff, had OK night.     ROS: limited by cognition/behavioral Objective:   No results found. No results for input(s): "WBC", "HGB", "HCT", "PLT" in the last 72 hours.   Recent Labs    12/22/22 1216  CREATININE 0.52      Intake/Output Summary (Last 24 hours) at 12/23/2022 1542 Last data filed at 12/23/2022 1306 Gross per 24 hour  Intake 1063 ml  Output --  Net 1063 ml      Pressure Injury 10/31/22 Buttocks Left Unstageable - Full thickness tissue loss in which the base of the injury is covered by slough (yellow, tan, gray, green or brown) and/or eschar (tan, brown or black) in the wound bed. (Active)  10/31/22 2000  Location: Buttocks  Location Orientation: Left  Staging: Unstageable - Full thickness tissue loss in which the base of the injury is covered by slough (yellow, tan, gray, green or brown) and/or eschar (tan, brown or black) in the wound bed.  Wound Description (Comments):   Present on Admission: Yes     Pressure Injury 11/18/22 Toe (Comment  which one) Anterior;Right Deep Tissue Pressure Injury - Purple or maroon localized area of discolored intact skin or blood-filled blister due to damage of underlying soft tissue from pressure and/or shear. small are (Active)  11/18/22 1300  Location: Toe (Comment  which one)  Location Orientation: Anterior;Right  Staging: Deep Tissue Pressure Injury - Purple or maroon localized area of discolored intact skin or blood-filled blister due to damage of underlying soft tissue from pressure and/or shear.  Wound Description (Comments): small area on right great toe metatarsal  Present on Admission: Yes    Physical Exam: Vital Signs Blood pressure (!) 150/82, pulse 79, temperature 98.6 F (37 C), temperature source Oral, resp. rate 17, height 5\' 1"  (1.549 m), weight 44.8 kg,  SpO2 100%.    General: awake, alert, but nonverbal- rubbing nose/scratching? With mittens; NAD HENT: conjugate gaze; oropharynx moist CV: regular rate and rhythm; no JVD Pulmonary: CTA B/L; no W/R/R- good air movement GI: soft, NT, ND, (+)BS- slightly hypoactive Psychiatric: staring out to R side Neurological: nonverbal- in mittens- Skin:  + Scaling dried skin on head - stable, stage 2 to right toe, area of redness between left thumb and index finger-covered in dressing, left buttock unstageable  Neurologic: Does not respond to orientation questions, does not track, does not follow cues. Does not follow verbal commands  Tone: MAS 3 right elbow, B knee flexors, and right hip; MAS 3 L elbow , MS 3-4 in wrist and finger flexors  Motor 0/5 strength in BUE and BUE  Cerebellar exam unable to assess  Musculoskeletal: reduced bilateral shoulder and elbow ROM due to increased tone  RIght Elbow brace in place Strong neck flexion Right wrist swelling   Assessment/Plan: 1. Functional deficits which require 3+ hours per day of interdisciplinary therapy in a comprehensive inpatient rehab setting. Physiatrist is providing close team supervision and 24 hour management of active medical problems listed below. Physiatrist and rehab team continue to assess barriers to discharge/monitor patient progress toward functional and medical goals  Care Tool:  Bathing    Body parts bathed by patient: Chest, Face   Body parts bathed by helper: Right arm, Left arm, Chest, Abdomen, Front perineal area, Buttocks, Right upper leg, Left upper leg, Right lower leg, Left lower leg, Face Body parts n/a: Right arm, Right upper leg, Left arm, Left upper leg, Chest, Abdomen, Right lower leg, Front perineal area, Left lower leg, Buttocks, Face   Bathing assist Assist Level: Dependent - Patient 0%     Upper Body Dressing/Undressing Upper body dressing   What is the patient wearing?: Hospital gown only    Upper body  assist Assist Level: Dependent - Patient 0%    Lower Body Dressing/Undressing Lower body dressing      What is the patient wearing?: Incontinence brief     Lower body assist Assist for lower body dressing: Dependent - Patient 0%     Toileting Toileting Toileting Activity did not occur (Clothing management and hygiene only): N/A (no void or bm)  Toileting assist Assist for toileting: Dependent - Patient 0%     Transfers Chair/bed transfer  Transfers assist  Chair/bed transfer activity did not occur: Safety/medical concerns  Chair/bed transfer assist level: Maximal Assistance - Patient 25 - 49%     Locomotion Ambulation   Ambulation assist   Ambulation activity did not occur: Safety/medical concerns          Walk 10 feet activity   Assist  Walk 10 feet activity did not occur: Safety/medical concerns        Walk 50 feet activity   Assist Walk 50 feet with 2 turns activity did not occur: Safety/medical concerns         Walk 150 feet activity   Assist Walk 150 feet activity did not occur: Safety/medical concerns         Walk 10 feet on uneven surface  activity   Assist Walk 10 feet on uneven surfaces activity did not occur: Safety/medical concerns         Wheelchair     Assist Is the patient using a wheelchair?: Yes Type of Wheelchair: Manual (tilt in space)    Wheelchair assist level: Dependent - Patient 0%      Wheelchair 50 feet with 2 turns activity    Assist    Wheelchair 50 feet with 2 turns activity did not occur: Safety/medical concerns   Assist Level: Dependent - Patient 0%   Wheelchair 150 feet activity     Assist  Wheelchair 150 feet activity did not occur: Safety/medical concerns   Assist Level: Dependent - Patient 0%   Blood pressure (!) 150/82, pulse 79, temperature 98.6 F (37 C), temperature source Oral, resp. rate 17, height 5\' 1"  (1.549 m), weight 44.8 kg, SpO2 100%.  Medical Problem List  and Plan: 1. 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  Impaired mobility: continue Lovenox, weekly creatinine ordered             -antiplatelet therapy: N/A 3. Pain Management: Tylenol as needed 4. Mood/Behavior/Sleep: Provide emotional support             -antipsychotic agents: N/A  5. Neuropsych/cognition: This patient is not capable of making decisions on her own behalf. 6. Left buttock unstageable ulcer.  Continue Medihoney to buttock wound daily cover with foam dressing.  Changing dressing every 3 days or as needed soiling   7. Fluids/Electrolytes/Nutrition: Routine in and outs with follow-up chemistries  -Labs 7/21 stable.  -Eating 100% most meals with total assist  Last labs 8/7- look good 8.  Cavitary right lower lobe pneumonia likely aspiration pneumonia/MRSA pneumonia.  Resolved    Latest Ref Rng & Units 12/20/2022   10:54 AM 12/11/2022    7:15 AM 12/03/2022    7:18 AM  CBC  WBC 4.0 - 10.5 K/uL 5.6  9.0  8.7   Hemoglobin 12.0 - 15.0 g/dL 16.1  09.6  04.5   Hematocrit 36.0 - 46.0 % 42.8  45.1  39.7   Platelets 150 - 400 K/uL 253  329  218       9.  History of drug abuse.  Positive cocaine on urine drug screen.  Provide counseling  10.  AKI/hypovolemia and ATN.  resolved    Latest Ref Rng & Units 12/22/2022   12:16 PM 12/20/2022   10:54 AM 12/15/2022    7:12 AM  BMP  Glucose 70 - 99 mg/dL  409    BUN 6 - 20 mg/dL  9    Creatinine 8.11 - 1.00 mg/dL 9.14  7.82  9.56   Sodium 135 - 145 mmol/L  137    Potassium 3.5 - 5.1 mmol/L  3.9    Chloride 98 - 111 mmol/L  101    CO2 22 - 32 mmol/L  22    Calcium 8.9 - 10.3 mg/dL  9.1    Recheck labs tomorrow ordered  11.  Mild transaminitis with rhabdomyolysis.  Both resolved  12.  Hypotension. Resolved, d/c midodrine  13. Impaired initiation: continue amantadine back to 100mg  BID  14. Suboptimal vitamin D: continue 1,000U D3 daily  15. Spasticity:   -Remains extremely tight, decrease baclofen to 15mg  TID due to fatigue. Appears to have increased sedation with this increase, continue to monitor. Continue wrist, elbow brace, and PRAFOs. Add tizanidine 2mg  HS  8/10- suggest low dose Dantrolene or in the long run, a Baclofen pump to be assessed in clinic? 16. Bowel and bladder incontinence: continue bowel and bladder program  -LBM 8/5   8/10- LBM x3 in last 12 hours- small- hard--will add Colace 100 mg daily 17. Left hand skin maceration: Eucerin ordered, discussed hand split with OT, conitnue  18. Tachycardia: mild,~90bpm decreased amantadine back to 100mg  BID, continue this doseHR stable in 80s Q8/10- BP a little elvated- will con't to monitor for trend    12/23/2022    3:07 PM 12/23/2022    7:32 AM 12/23/2022    5:46 AM  Vitals with BMI  Systolic 150 108 213  Diastolic 82 75 79  Pulse 79 66 72     19.  History of elevated SBP: BP reviewed and is stable  20. Area of redness between left thumb and index finger: continue foam dressing  21. Scalp eczema: selsun blue ordered, continue  22. Fatigue: improved, continue baclofen 15mg  TID, B12 reviewed and is 335, in suboptimal  range, will give 1,000U B12 injection 8/7  23. Right wrist swelling: XR ordered and shows no acute fracture, shows 2.43mm ulnar variance, brace removed to given patient a break from it. Voltaren gel ordered prn for discomfort, continue  23. Dysphagia: continue D3 diet  LOS: 35 days A FACE TO FACE EVALUATION WAS PERFORMED    12/23/2022, 3:42 PM

## 2022-12-24 NOTE — Progress Notes (Signed)
PROGRESS NOTE   Subjective/Complaints:  Pt nonverbal - per staff slept OK.  No acknowledgement that I was in room- but focused on trying to use hands with mittens in place.   ROS: limited by cognition/behavioral Objective:   No results found. No results for input(s): "WBC", "HGB", "HCT", "PLT" in the last 72 hours.   Recent Labs    12/22/22 1216  CREATININE 0.52      Intake/Output Summary (Last 24 hours) at 12/24/2022 1439 Last data filed at 12/24/2022 1311 Gross per 24 hour  Intake 1296 ml  Output --  Net 1296 ml      Pressure Injury 10/31/22 Buttocks Left Unstageable - Full thickness tissue loss in which the base of the injury is covered by slough (yellow, tan, gray, green or brown) and/or eschar (tan, brown or black) in the wound bed. (Active)  10/31/22 2000  Location: Buttocks  Location Orientation: Left  Staging: Unstageable - Full thickness tissue loss in which the base of the injury is covered by slough (yellow, tan, gray, green or brown) and/or eschar (tan, brown or black) in the wound bed.  Wound Description (Comments):   Present on Admission: Yes     Pressure Injury 11/18/22 Toe (Comment  which one) Anterior;Right Deep Tissue Pressure Injury - Purple or maroon localized area of discolored intact skin or blood-filled blister due to damage of underlying soft tissue from pressure and/or shear. small are (Active)  11/18/22 1300  Location: Toe (Comment  which one)  Location Orientation: Anterior;Right  Staging: Deep Tissue Pressure Injury - Purple or maroon localized area of discolored intact skin or blood-filled blister due to damage of underlying soft tissue from pressure and/or shear.  Wound Description (Comments): small area on right great toe metatarsal  Present on Admission: Yes    Physical Exam: Vital Signs Blood pressure 100/80, pulse 71, temperature 98.2 F (36.8 C), temperature source Oral, resp.  rate 19, height 5\' 1"  (1.549 m), weight 44.8 kg, SpO2 99%.     General: awake, alert, curled up in ball;  NAD HENT: conjugate gaze; oropharynx moist CV: regular rate and rhythm; no JVD Pulmonary: CTA B/L; no W/R/R- good air movement GI: soft, NT, ND, (+)BS Psychiatric: very flat Neurological: nonverbal- focused on staring- didn't really acknowledge I was there, nursing said at baseline- Skin:  + Scaling dried skin on head - stable, stage 2 to right toe, area of redness between left thumb and index finger-covered in dressing, left buttock unstageable  Neurologic: Does not respond to orientation questions, does not track, does not follow cues. Does not follow verbal commands  Tone: MAS 3 right elbow, B knee flexors, and right hip; MAS 3 L elbow , MS 3-4 in wrist and finger flexors  Motor 0/5 strength in BUE and BUE  Cerebellar exam unable to assess  Musculoskeletal: reduced bilateral shoulder and elbow ROM due to increased tone  RIght Elbow brace in place Strong neck flexion Right wrist swelling   Assessment/Plan: 1. Functional deficits which require 3+ hours per day of interdisciplinary therapy in a comprehensive inpatient rehab setting. Physiatrist is providing close team supervision and 24 hour management of active medical problems listed below. Physiatrist  and rehab team continue to assess barriers to discharge/monitor patient progress toward functional and medical goals  Care Tool:  Bathing    Body parts bathed by patient: Chest, Face   Body parts bathed by helper: Right arm, Left arm, Chest, Abdomen, Front perineal area, Buttocks, Right upper leg, Left upper leg, Right lower leg, Left lower leg, Face Body parts n/a: Right arm, Right upper leg, Left arm, Left upper leg, Chest, Abdomen, Right lower leg, Front perineal area, Left lower leg, Buttocks, Face   Bathing assist Assist Level: Dependent - Patient 0%     Upper Body Dressing/Undressing Upper body dressing   What is  the patient wearing?: Hospital gown only    Upper body assist Assist Level: Dependent - Patient 0%    Lower Body Dressing/Undressing Lower body dressing      What is the patient wearing?: Incontinence brief     Lower body assist Assist for lower body dressing: Dependent - Patient 0%     Toileting Toileting Toileting Activity did not occur (Clothing management and hygiene only): N/A (no void or bm)  Toileting assist Assist for toileting: Dependent - Patient 0%     Transfers Chair/bed transfer  Transfers assist  Chair/bed transfer activity did not occur: Safety/medical concerns  Chair/bed transfer assist level: Maximal Assistance - Patient 25 - 49%     Locomotion Ambulation   Ambulation assist   Ambulation activity did not occur: Safety/medical concerns          Walk 10 feet activity   Assist  Walk 10 feet activity did not occur: Safety/medical concerns        Walk 50 feet activity   Assist Walk 50 feet with 2 turns activity did not occur: Safety/medical concerns         Walk 150 feet activity   Assist Walk 150 feet activity did not occur: Safety/medical concerns         Walk 10 feet on uneven surface  activity   Assist Walk 10 feet on uneven surfaces activity did not occur: Safety/medical concerns         Wheelchair     Assist Is the patient using a wheelchair?: Yes Type of Wheelchair: Manual (tilt in space)    Wheelchair assist level: Dependent - Patient 0%      Wheelchair 50 feet with 2 turns activity    Assist    Wheelchair 50 feet with 2 turns activity did not occur: Safety/medical concerns   Assist Level: Dependent - Patient 0%   Wheelchair 150 feet activity     Assist  Wheelchair 150 feet activity did not occur: Safety/medical concerns   Assist Level: Dependent - Patient 0%   Blood pressure 100/80, pulse 71, temperature 98.2 F (36.8 C), temperature source Oral, resp. rate 19, height 5\' 1"  (1.549  m), weight 44.8 kg, SpO2 99%.  Medical Problem List and Plan: 1. 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  Impaired mobility: continue Lovenox, weekly creatinine ordered             -antiplatelet therapy: N/A 3. Pain Management: Tylenol as needed 4. Mood/Behavior/Sleep: Provide emotional support             -antipsychotic agents: N/A  5. Neuropsych/cognition: This patient is not capable of making decisions on her own behalf. 6. Left buttock unstageable ulcer.  Continue Medihoney to buttock wound daily cover with foam dressing.  Changing dressing every 3 days or as needed soiling   7. Fluids/Electrolytes/Nutrition: Routine in and outs with follow-up chemistries  -Labs 7/21 stable.  -Eating 100% most meals with total assist  Last labs 8/7- look good  8/11- eating well per nursing 8.  Cavitary right lower lobe pneumonia likely aspiration pneumonia/MRSA pneumonia.  Resolved    Latest Ref Rng & Units 12/20/2022   10:54 AM 12/11/2022    7:15 AM 12/03/2022    7:18 AM  CBC  WBC 4.0 - 10.5 K/uL 5.6  9.0  8.7   Hemoglobin 12.0 - 15.0 g/dL 96.2  95.2  84.1    Hematocrit 36.0 - 46.0 % 42.8  45.1  39.7   Platelets 150 - 400 K/uL 253  329  218      9.  History of drug abuse.  Positive cocaine on urine drug screen.  Provide counseling  10.  AKI/hypovolemia and ATN.  resolved    Latest Ref Rng & Units 12/22/2022   12:16 PM 12/20/2022   10:54 AM 12/15/2022    7:12 AM  BMP  Glucose 70 - 99 mg/dL  324    BUN 6 - 20 mg/dL  9    Creatinine 4.01 - 1.00 mg/dL 0.27  2.53  6.64   Sodium 135 - 145 mmol/L  137    Potassium 3.5 - 5.1 mmol/L  3.9    Chloride 98 - 111 mmol/L  101    CO2 22 - 32 mmol/L  22    Calcium 8.9 - 10.3 mg/dL  9.1    Recheck labs tomorrow ordered  11.  Mild transaminitis with rhabdomyolysis.  Both resolved  12.  Hypotension. Resolved, d/c midodrine  13. Impaired initiation: continue amantadine back to 100mg  BID  14. Suboptimal vitamin D: continue 1,000U D3 daily  15. Spasticity:   -Remains extremely tight, decrease baclofen to 15mg  TID due to fatigue. Appears to have increased sedation with this increase, continue to monitor. Continue wrist, elbow brace, and PRAFOs. Add tizanidine 2mg  HS  8/10- suggest low dose Dantrolene or in the long run, a Baclofen pump to be assessed in clinic? 16. Bowel and bladder incontinence: continue bowel and bladder program  -LBM 8/5   8/10- LBM x3 in last 12 hours- small- hard--will add Colace 100 mg daily  8/11- Medium BM today- incontinent- better/less hard than yesterday with Colace 17. Left hand skin maceration: Eucerin ordered, discussed hand split with OT, conitnue  18. Tachycardia: mild,~90bpm decreased amantadine back to 100mg  BID, continue this doseHR stable in 80s Q8/10- BP a little elvated- will con't to monitor for trend    12/24/2022    1:09 PM 12/24/2022  5:23 AM 12/23/2022    7:34 PM  Vitals with BMI  Systolic 100 118 161  Diastolic 80 85 85  Pulse 71 93 80     19. History of elevated SBP: BP reviewed and is stable  20. Area of redness between left thumb and index finger:  continue foam dressing  21. Scalp eczema: selsun blue ordered, continue  22. Fatigue: improved, continue baclofen 15mg  TID, B12 reviewed and is 335, in suboptimal range, will give 1,000U B12 injection 8/7  23. Right wrist swelling: XR ordered and shows no acute fracture, shows 2.30mm ulnar variance, brace removed to given patient a break from it. Voltaren gel ordered prn for discomfort, continue  23. Dysphagia: continue D3 diet  LOS: 36 days A FACE TO FACE EVALUATION WAS PERFORMED    12/24/2022, 2:39 PM

## 2022-12-25 NOTE — Plan of Care (Signed)
Pt alert. Unable to assess orientation due to nonverbal. Pt left arm contracted drawn to chest. brace applied this shift by patient facial expression she doesn't prefer brace. Incontinent x 2. Last BM 8/11.  Brace/heel protector to bilateral legs. Vitals stable. Stg 2 to sacrum with foam in place. Dsg to be changed this am.

## 2022-12-25 NOTE — Progress Notes (Signed)
Occupational Therapy Session Note  Patient Details  Name: Nicole [name] MRN: [id number] Date of Birth: 1975/10/10  Today's Date: 12/25/2022 OT Individual Time: 6962-9528 OT Individual Time Calculation (min): 40 min    Short Term Goals: Week 1:  OT Short Term Goal 1 (Week 1): Pt will initiate simple ADL task with Max multimodal cuing. OT Short Term Goal 1 - Progress (Week 1): Not met OT Short Term Goal 2 (Week 1): Pt will attend to ADL task for >2 min in distraction-free environment. OT Short Term Goal 2 - Progress (Week 1): Not met OT Short Term Goal 3 (Week 1): Pt will express preference between two options in preparation for self-advocacy during medical care. OT Short Term Goal 3 - Progress (Week 1): Not met Week 2:  OT Short Term Goal 1 (Week 2): Patient will visually attend to self in mirror x 3-5 seconds with max cueing as prerequisite to grooming tasks OT Short Term Goal 1 - Progress (Week 2): Progressing toward goal OT Short Term Goal 2 (Week 2): Patient will participate following hand over hand cueing in familiar functional task - e.g. brushing hair, brushing teeth, washing body with RUE and mod assist OT Short Term Goal 2 - Progress (Week 2): Met OT Short Term Goal 3 (Week 2): Patient will sit unsupported x 5 seconds in preparation for level surface transfer OT Short Term Goal 3 - Progress (Week 2): Progressing toward goal OT Short Term Goal 4 (Week 2): Patient will demonstrate localized response (looking toward)to auditory stimulus (name being called) 3 of 5 trials from supported position. OT Short Term Goal 4 - Progress (Week 2): Progressing toward goal OT Short Term Goal 5 (Week 2): Patient will demonstrate improved passive or active assisted elbow range of motion in preparation for assistance with BADL.  Right -5* extension, Left - 15* extension OT Short Term Goal 5 - Progress (Week 2): Progressing toward goal Week 3:  OT Short Term Goal 1 (Week 3): pt will tolerate tilt table to  at least 60* for 3 consecutive sessions as precursor to higher level functional mobility tasks OT Short Term Goal 1 - Progress (Week 3): Not met OT Short Term Goal 2 (Week 3): pt will respond to verbal stimuli 3/5 trials to improve attention to task OT Short Term Goal 2 - Progress (Week 3): Met OT Short Term Goal 3 (Week 3): pt will maintain static sitting balance EOB with CGA for at least 10 secs as precursor to higher level ADl tasks OT Short Term Goal 3 - Progress (Week 3): Met Week 4:  OT Short Term Goal 1 (Week 4): Patient will sit unsupported at edge of bed x 15 seconds in preparation for level surface transfer OT Short Term Goal 1 - Progress (Week 4): Progressing toward goal OT Short Term Goal 2 (Week 4): Patient will transition from sit to stand and stand to sit as needed for lower body dressing with max assist OT Short Term Goal 2 - Progress (Week 4): Met OT Short Term Goal 3 (Week 4): Patient will turn head/ eyes toward auditory stimulation either right or left 25%/ time to aide in focused attention OT Short Term Goal 3 - Progress (Week 4): Met OT Short Term Goal 4 (Week 4): Patient will participate following hand over hand assistance in at least one familiar functional task x 5-10 seconds. OT Short Term Goal 4 - Progress (Week 4): Met Week 5:  OT Short Term Goal 1 (Week 5): Patient will  sit unsupported at edge of bed x 15 seconds in preparation for level surface transfer OT Short Term Goal 2 (Week 5): Patient will demonstrate improved focused attention as evidenced by sustaining eye contact x at least 10 seconds while in supported position. OT Short Term Goal 3 (Week 5): Patient will demonstrate improved passive/active range of motion in right fingers to allow improved functional use of RUE for participation in BADL  Skilled Therapeutic Interventions/Progress Updates:    Pt received in room in TIS wc with her mom feeding her lunch. Her mom was receptive to allowing me to show her how to  facilitate hand over hand feeding using red foam handle on spoon.  Pt completed her peaches using initiation to bring spoon to mouth as hand was several inches from face.    Elbow and hand splint doffed on L hand.  Her hand web space had alleyvn patch on it due to prior rubbing. The patch removed and skin red.  Suggested to mom and RN to leave splint off hand for 24 hours.  Her hand tightness has decreased and PROM able to be achieved in fingers for most of her range.   Suggested placing washcloth in hand to protect palm from nails.   Worked on bicep massage and slow gradual stretching.  Elbow splint was set to prevent more than 90 degrees of elbow flexion. With passive range able to achieve 95 degrees of elbow extension.  Adjusted splint parameters to stop flexion at 85 degrees and allow for full extension if pt's elbow would allow.  Educated mom on how to watch for redness at forearm site of splint in case the new setting ends up putting too much pressure on forearm.   Reapplied elbow splint.  Pt adjusted back in TIS chair to rest prior to next therapy session.   Therapy Documentation Precautions:  Precautions Precautions: Fall Precaution Comments: Contact precautions & delayed processing Restrictions Weight Bearing Restrictions: No   Pain:  Faces: no pain        Therapy/Group: Individual Therapy  , 12/25/2022, 2:58 PM

## 2022-12-25 NOTE — Progress Notes (Signed)
PROGRESS NOTE   Subjective/Complaints: Nicole [name] appears comfortable this morning Soft cervical collar, elbow brace, resting hand splint in place Total assist  ROS: Limit by cognition/behavioral Objective:   No results found. No results for input(s): "WBC", "HGB", "HCT", "PLT" in the last 72 hours.   No results for input(s): "NA", "K", "CL", "CO2", "GLUCOSE", "BUN", "CREATININE", "CALCIUM" in the last 72 hours.     Intake/Output Summary (Last 24 hours) at 12/25/2022 1249 Last data filed at 12/25/2022 0840 Gross per 24 hour  Intake 1060 ml  Output --  Net 1060 ml      Pressure Injury 10/31/22 Buttocks Left Unstageable - Full thickness tissue loss in which the base of the injury is covered by slough (yellow, tan, gray, green or brown) and/or eschar (tan, brown or black) in the wound bed. (Active)  10/31/22 2000  Location: Buttocks  Location Orientation: Left  Staging: Unstageable - Full thickness tissue loss in which the base of the injury is covered by slough (yellow, tan, gray, green or brown) and/or eschar (tan, brown or black) in the wound bed.  Wound Description (Comments):   Present on Admission: Yes     Pressure Injury 11/18/22 Toe (Comment  which one) Anterior;Right Deep Tissue Pressure Injury - Purple or maroon localized area of discolored intact skin or blood-filled blister due to damage of underlying soft tissue from pressure and/or shear. small are (Active)  11/18/22 1300  Location: Toe (Comment  which one)  Location Orientation: Anterior;Right  Staging: Deep Tissue Pressure Injury - Purple or maroon localized area of discolored intact skin or blood-filled blister due to damage of underlying soft tissue from pressure and/or shear.  Wound Description (Comments): small area on right great toe metatarsal  Present on Admission: Yes    Physical Exam: Vital Signs Blood pressure (!) 138/97, pulse 92, temperature  98 F (36.7 C), resp. rate 18, height 5\' 1"  (1.549 m), weight 44.8 kg, SpO2 98%. Gen: no distress, normal appearing HEENT: oral mucosa pink and moist, NCAT Cardio: Reg rate Chest: normal effort, normal rate of breathing Abd: soft, non-distended Ext: no edema   Psychiatric: very flat Neurological: nonverbal- focused on staring- didn't really acknowledge I was there, nursing said at baseline- Skin:  + Scaling dried skin on head - stable, stage 2 to right toe, area of redness between left thumb and index finger-covered in dressing, left buttock unstageable  Neurologic: Does not respond to orientation questions, does not track, does not follow cues. Does not follow verbal commands  Tone: MAS 3 right elbow, B knee flexors, and right hip; MAS 3 L elbow , MS 3-4 in wrist and finger flexors  Motor 0/5 strength in BUE and BUE  Cerebellar exam unable to assess  Musculoskeletal: reduced bilateral shoulder and elbow ROM due to increased tone  RIght Elbow brace in place Strong neck flexion Right wrist swelling   Assessment/Plan: 1. Functional deficits which require 3+ hours per day of interdisciplinary therapy in a comprehensive inpatient rehab setting. Physiatrist is providing close team supervision and 24 hour management of active medical problems listed below. Physiatrist and rehab team continue to assess barriers to discharge/monitor patient progress toward functional and  medical goals  Care Tool:  Bathing    Body parts bathed by patient: Chest, Face   Body parts bathed by helper: Right arm, Left arm, Chest, Abdomen, Front perineal area, Buttocks, Right upper leg, Left upper leg, Right lower leg, Left lower leg, Face Body parts n/a: Right arm, Right upper leg, Left arm, Left upper leg, Chest, Abdomen, Right lower leg, Front perineal area, Left lower leg, Buttocks, Face   Bathing assist Assist Level: Dependent - Patient 0%     Upper Body Dressing/Undressing Upper body dressing   What  is the patient wearing?: Hospital gown only    Upper body assist Assist Level: Dependent - Patient 0%    Lower Body Dressing/Undressing Lower body dressing      What is the patient wearing?: Incontinence brief     Lower body assist Assist for lower body dressing: Dependent - Patient 0%     Toileting Toileting Toileting Activity did not occur (Clothing management and hygiene only): N/A (no void or bm)  Toileting assist Assist for toileting: Dependent - Patient 0%     Transfers Chair/bed transfer  Transfers assist  Chair/bed transfer activity did not occur: Safety/medical concerns  Chair/bed transfer assist level: Maximal Assistance - Patient 25 - 49%     Locomotion Ambulation   Ambulation assist   Ambulation activity did not occur: Safety/medical concerns          Walk 10 feet activity   Assist  Walk 10 feet activity did not occur: Safety/medical concerns        Walk 50 feet activity   Assist Walk 50 feet with 2 turns activity did not occur: Safety/medical concerns         Walk 150 feet activity   Assist Walk 150 feet activity did not occur: Safety/medical concerns         Walk 10 feet on uneven surface  activity   Assist Walk 10 feet on uneven surfaces activity did not occur: Safety/medical concerns         Wheelchair     Assist Is the patient using a wheelchair?: Yes Type of Wheelchair: Manual (tilt in space)    Wheelchair assist level: Dependent - Patient 0%      Wheelchair 50 feet with 2 turns activity    Assist    Wheelchair 50 feet with 2 turns activity did not occur: Safety/medical concerns   Assist Level: Dependent - Patient 0%   Wheelchair 150 feet activity     Assist  Wheelchair 150 feet activity did not occur: Safety/medical concerns   Assist Level: Dependent - Patient 0%   Blood pressure (!) 138/97, pulse 92, temperature 98 F (36.7 C), resp. rate 18, height 5\' 1"  (1.549 m), weight 44.8 kg,  SpO2 98%.  Medical Problem List and Plan: 1. 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  Impaired mobility: continue Lovenox, weekly creatinine ordered             -antiplatelet therapy: N/A 3. Pain Management: Tylenol as needed 4. Mood/Behavior/Sleep: Provide emotional support             -antipsychotic agents: N/A  5. Neuropsych/cognition: This patient is not capable of making decisions on her own behalf. 6. Left buttock unstageable ulcer.  Continue Medihoney to buttock wound daily cover with foam dressing.  Changing dressing every 3 days or as needed soiling   7. Fluids/Electrolytes/Nutrition: Routine in and outs with follow-up chemistries  -Labs 7/21 stable.  -Eating 100% most meals with total assist  Last labs 8/7- look good  8/11- eating well per nursing 8.  Cavitary right lower lobe pneumonia likely aspiration pneumonia/MRSA pneumonia.  Resolved    Latest Ref Rng & Units 12/20/2022   10:54 AM 12/11/2022    7:15 AM 12/03/2022    7:18 AM  CBC  WBC 4.0 - 10.5 K/uL 5.6  9.0  8.7   Hemoglobin 12.0 - 15.0 g/dL 13.0  86.5  78.4   Hematocrit 36.0 - 46.0 %  42.8  45.1  39.7   Platelets 150 - 400 K/uL 253  329  218      9.  History of drug abuse.  Positive cocaine on urine drug screen.  Provide counseling  10.  AKI/hypovolemia and ATN.  resolved    Latest Ref Rng & Units 12/22/2022   12:16 PM 12/20/2022   10:54 AM 12/15/2022    7:12 AM  BMP  Glucose 70 - 99 mg/dL  696    BUN 6 - 20 mg/dL  9    Creatinine 2.95 - 1.00 mg/dL 2.84  1.32  4.40   Sodium 135 - 145 mmol/L  137    Potassium 3.5 - 5.1 mmol/L  3.9    Chloride 98 - 111 mmol/L  101    CO2 22 - 32 mmol/L  22    Calcium 8.9 - 10.3 mg/dL  9.1    Recheck labs tomorrow ordered  11.  Mild transaminitis with rhabdomyolysis.  Both resolved  12.  Hypotension. Resolved, d/c midodrine  13. Impaired initiation: continue amantadine back to 100mg  BID  14. Suboptimal vitamin D: continue 1,000U D3 daily  15. Spasticity:   -Remains extremely tight, decrease baclofen to 15mg  TID due to fatigue. Appears to have increased sedation with this increase, continue to monitor. Continue wrist, elbow brace, and PRAFOs. Add tizanidine 2mg  HS  8/10- suggest low dose Dantrolene or in the long run, a Baclofen pump to be assessed in clinic? 16. Bowel and bladder incontinence: continue bowel and bladder program  -LBM 8/5   8/10- LBM x3 in last 12 hours- small- hard--will add Colace 100 mg daily  8/11- Medium BM today- incontinent- better/less hard than yesterday with Colace 17. Left hand skin maceration: Eucerin ordered, discussed hand split with OT, conitnue  18. Tachycardia: mild,~90bpm decreased amantadine back to 100mg  BID, continue this doseHR stable in 80s Q8/10- BP a little elvated- will con't to monitor for trend    12/24/2022    9:13 PM 12/24/2022    1:09 PM 12/24/2022    5:23 AM  Vitals with BMI  Systolic 138 100  118  Diastolic 97 80 85  Pulse 92 71 93     19. History of elevated SBP: BP reviewed and is stable  20. Area of redness between left thumb and index finger: Continue foam  dressing  21. Scalp eczema: selsun blue ordered, continue  22. Fatigue: improved, continue baclofen 15mg  TID, B12 reviewed and is 335, in suboptimal range, will give 1,000U B12 injection 8/7  23. Right wrist swelling: XR ordered and shows no acute fracture, shows 2.52mm ulnar variance, brace removed to given patient a break from it. Voltaren gel ordered prn for discomfort, continue  23. Dysphagia: continue D3 diet  LOS: 37 days A FACE TO FACE EVALUATION WAS PERFORMED  Drema Pry  12/25/2022, 12:49 PM

## 2022-12-25 NOTE — Progress Notes (Signed)
Occupational Therapy Session Note  Patient Details  Name: GENEIVE KAWAHARA MRN: [id number] Date of Birth: 27-Aug-1975  Today's Date: 12/25/2022 OT Individual Time: 1017-1100 OT Individual Time Calculation (min): 43 min    Short Term Goals: Week 5:  OT Short Term Goal 1 (Week 5): Patient will sit unsupported at edge of bed x 15 seconds in preparation for level surface transfer OT Short Term Goal 2 (Week 5): Patient will demonstrate improved focused attention as evidenced by sustaining eye contact x at least 10 seconds while in supported position. OT Short Term Goal 3 (Week 5): Patient will demonstrate improved passive/active range of motion in right fingers to allow improved functional use of RUE for participation in BADL  Skilled Therapeutic Interventions/Progress Updates:    Patient received up in wheelchair with multiple braces and positioning devices in place.  Soft cervical collar, elbow brace, resting hand splint - left, pillow between knees, reclined in wheelchair but head still pulling forward.  Patient transferred to bed, then to rolling shower chair.  Patient showered with total assist.  Transferred back to bed cleaned patient after incontinent BM, placed clean brief and dressed lower body with total assist.  Patient more able to assist with rolling left/right during self care.  Transferred back to wheelchair to don upper body clothing, brush hair, and complete oral care.  Patient left up in wheelchair with safety belt in place and engaged and call bell nearby.    Therapy Documentation Precautions:  Precautions Precautions: Fall Precaution Comments: Contact precautions & delayed processing Restrictions Weight Bearing Restrictions: No   Pain: Patient made sound like ouch with transfer to shower chair    Therapy/Group: Individual Therapy  [name] 12/25/2022, 12:36 PM

## 2022-12-25 NOTE — Progress Notes (Signed)
Physical Therapy Session Note  Patient Details  Name: Nicole [name] MRN: [id number] Date of Birth: [birth year]  Today's Date: 12/25/2022 PT Individual Time: 0905-1010 PT Individual Time Calculation (min): 65 min   Short Term Goals: Week 4:  PT Short Term Goal 1 (Week 4): Pt will initiate bed mobility but require no more than totalA. PT Short Term Goal 1 - Progress (Week 4): Not met PT Short Term Goal 2 (Week 4): Pt will maintain sitting balance unsupported for 30 seconds PT Short Term Goal 2 - Progress (Week 4): Progressing toward goal PT Short Term Goal 3 (Week 4): Pt will demonstrate command follow for ~15% of tasks. PT Short Term Goal 3 - Progress (Week 4): Not met Week 5:  PT Short Term Goal 1 (Week 5): Pt will initiate bed mobility requiring no more than totalA. PT Short Term Goal 2 (Week 5): Pt will maintain sitting balance unsupported for up to 30 seconds PT Short Term Goal 3 (Week 5): Pt will demonstrate command follow for >10% of tasks. PT Short Term Goal 4 (Week 5): [name] RLE will demonstrate improved joint mobility into knee/ hip extension and ankle DF to neutral.  Skilled Therapeutic Interventions/Progress Updates:  Patient supine in bed on entrance to room. She is fully flexed with turn to R side. Hands tucked under chin, BLE bent with PRAFOs  loosely donned. No elbow brace donned. Pt repositioned into more extended position with HOB lowered for improved positioning. With manual hold of LLE into extension, pt states, "ow". With manual stretch to LUE, pt relates "ow" again. Also demonstrates improved eye movements toward therapist and back to limb being stretched throughout session.    Patient with demonstration of discomfort as described above.     Attempt to engage pt throughout session with conversation. One questionable head nod in answer to y/n question.   Bed Mobility: Pt hair washed with DEP use of washcloths. Supine>sit with DEP and move of arms into positioning for future  physical assist into seated position. TotA to maintain seated position. Pt demos 2 instances of reflexive initiation of arm movements forward with assessment of seated balance and allowed to controlled fall into forward flexion.    Therapeutic Activity/ NMR: Transfers: Squat pivot from bed to w/c to seated position on EOB. Pt performed sit<>stand from w/c  throughout session with DEP in standing frame. Provided vc throughout for technique and potential physical engagement.    Neuromuscular Re-education: Pt in standing frame for and guided in arm forward movements, scapular retraction and cervical extension. Attempt to engage pt in head control/ eye mvmt toward conscious acknowledgement of items in front of her. No changes in position/ awareness noted throughout. Little engagement with eye movements during time in stance.   NMR performed for improvements in motor control, cognition, and maintaining body mobility for future purposeful movements.    Manual Therapy: Seated BLE stretch into knee extension and ankle DF to prevent contracture that seems to be worsening after no therapy with limited OOB time this weekend.   Stretch as described above for pecs with scapular retraction holds. L and RUE holds into elbow extension. With arms on table of standing frame, shoulder IR/ ER mobilizations for improved range.   All needs within reach. NT notified as to pt's disposition. Pt seated upright in TIS w/c with tray table donned and pillow props for improved arm positioning and UB support. Pillows between knees for BLE positioning.   Signs placed above bed for nighttime  positioning into extension and placement of PRAFOs. Elbow splint wear schedule for week created for staff compliance.    Therapy Documentation Precautions:  Precautions Precautions: Fall Precaution Comments: Contact precautions & delayed processing Restrictions Weight Bearing Restrictions: No General:   Vital Signs:    Pain:   Therapy/Group: Individual Therapy  Loel Dubonnet PT, DPT, CSRS 12/25/2022, 12:09 PM

## 2022-12-25 NOTE — Progress Notes (Addendum)
Speech Language Pathology Daily Session Note  Patient Details  Name: Nicole [name] MRN: [id number] Date of Birth: [birth year]  Today's Date: 12/25/2022 SLP Individual Time: 1400-1457 SLP Individual Time Calculation (min): 57 min  Short Term Goals: Week 5: SLP Short Term Goal 1 (Week 5): Pt will present w/ appropriate response to functional stimuli (i.e. accept spoon, grasp basic item appropriately, response to tactile/auditory stimuli) 15% of the time w/ maxA multimodal cues SLP Short Term Goal 2 (Week 5): Patient will follow 1-step commands within a context in 5% of opportunities with Max A multimodal cues. SLP Short Term Goal 3 (Week 5): Patient will utilize multimodal communication to answer basic yes/no questions with maxA in 5% of opportunities  Skilled Therapeutic Interventions:  Pt was seen in PM to address cognitive re- training. Pt was alert and seated upright in WC upon SLP arrival. [name] present however she left soon after SLP arrival. Pt turned her head toward auditory stimulus upon SLP entrance. SLP attempted to engage pt in eye contact however no successful instances occurred throughout session. SLP challenging pt in establishing communication system. Given visual binary stimulus, pt was challenged to identify biographical information. In one attempt, pt verbalized, "no" in response to a wrong last name. In all other attempts pt was total A for pointing and no other verbal language heard throughout session. Pt grimaced occasionally throughout session during hand over hand and stretching RUE toward paper. Pt with no verbal stimuli in response to pain despite clinician modeling "ouch" or "ow." SLP also challenging pt in following directions. SLP unable to assess if pt opening her mouth or sticking out her tongue was intentional vs. Coincidental during session. No other signs of following directions observed. NT in and out during session obtaining vitals, pt BP obseved to be high in  multiple attempts and across repositioning. NT notified nurse during session. Pt with no signs of discomfort otherwise/ Finally, SLP challenged pt in hand over hand writing of her name requiring total A. Pt left seated upright in WC with call button within reach and chair alarm active. SLP to continue POC.   Pain Pain Assessment Pain Scale: 0-10 Faces Pain Scale: No hurt  Therapy/Group: Individual Therapy  [name] 12/25/2022, 3:29 PM

## 2022-12-26 DIAGNOSIS — R252 Cramp and spasm: Secondary | ICD-10-CM

## 2022-12-26 MED ORDER — BACLOFEN 10 MG PO TABS
20.0000 mg | ORAL_TABLET | Freq: Three times a day (TID) | ORAL | Status: DC
  Administered 2022-12-26 – 2023-03-02 (×197): 20 mg via ORAL
  Filled 2022-12-26 (×200): qty 2

## 2022-12-26 MED ORDER — BACLOFEN 10 MG PO TABS
20.0000 mg | ORAL_TABLET | Freq: Three times a day (TID) | ORAL | Status: DC
  Filled 2022-12-26: qty 2

## 2022-12-26 MED ORDER — DOCUSATE SODIUM 50 MG/5ML PO LIQD
100.0000 mg | Freq: Every day | ORAL | Status: DC
  Administered 2022-12-26 – 2023-01-11 (×17): 100 mg via ORAL
  Filled 2022-12-26 (×16): qty 10

## 2022-12-26 MED ORDER — AMANTADINE HCL 50 MG/5ML PO SOLN
100.0000 mg | Freq: Two times a day (BID) | ORAL | Status: DC
  Administered 2022-12-26 – 2023-01-19 (×49): 100 mg via ORAL
  Filled 2022-12-26 (×50): qty 10

## 2022-12-26 NOTE — Progress Notes (Signed)
Physical Therapy Session Note  Patient Details  Name: ESTELLA MONTEIRO MRN: [id number] Date of Birth: [birth year]  Today's Date: 12/26/2022 PT Individual Time: 0930-1000 PT Individual Time Calculation (min): 30 min   Short Term Goals: Week 5:  PT Short Term Goal 1 (Week 5): Pt will initiate bed mobility requiring no more than totalA. PT Short Term Goal 2 (Week 5): Pt will maintain sitting balance unsupported for up to 30 seconds PT Short Term Goal 3 (Week 5): Pt will demonstrate command follow for >10% of tasks. PT Short Term Goal 4 (Week 5): [name] RLE will demonstrate improved joint mobility into knee/ hip extension and ankle DF to neutral.  Skilled Therapeutic Interventions/Progress Updates: Pt presents siting in TIS reclined back.  Pt wheeled to dayroom for use of standing frame.  Pt stood in frame x 10-11' w/ facilitation of weight shifting to R LE as well as c/spine extension.  Pt attempts to move head away from PT hand.  Pt w/ maintenance of neutral head position  for 3-5 seconds before falls into flexion.  Pt returned to room and remained sitting reclined in TIS w/ safety belt on and all needs in reach.     Therapy Documentation Precautions:  Precautions Precautions: Fall Precaution Comments: Contact precautions & delayed processing Restrictions Weight Bearing Restrictions: No General:   Vital Signs:   Pain:grimaces w/ stretching into c/spine extension to neutral. Pain Assessment Pain Scale: 0-10 Pain Score: 0-No pain Faces Pain Scale: No hurt   Therapy/Group: Individual Therapy  [name] 12/26/2022, 12:05 PM

## 2022-12-26 NOTE — Progress Notes (Signed)
Patient ID: Nicole [name], female   DOB: November 29, 1975, 47 y.o.   MRN: [id number]  Spoke with Doreene Adas worker for [employer]. Asked if anything she needed form the hospital. She reports she has to wait for disability to determine her eligibility and then medicaid will follow. So will need to wait for Disability determination. [name] pending MA number is [phone] S

## 2022-12-26 NOTE — Progress Notes (Signed)
Occupational Therapy Session Note  Patient Details  Name: Nicole [name] MRN: [id number] Date of Birth: [birth year]  Today's Date: 12/26/2022 OT Individual Time: 1120-1202 OT Individual Time Calculation (min): 42 min    Short Term Goals: Week 5:  OT Short Term Goal 1 (Week 5): Patient will sit unsupported at edge of bed x 15 seconds in preparation for level surface transfer OT Short Term Goal 2 (Week 5): Patient will demonstrate improved focused attention as evidenced by sustaining eye contact x at least 10 seconds while in supported position. OT Short Term Goal 3 (Week 5): Patient will demonstrate improved passive/active range of motion in right fingers to allow improved functional use of RUE for participation in BADL  Skilled Therapeutic Interventions/Progress Updates:  Pt greeted seated in w/c, pt agreeable to OT intervention. Pt completed squat pivot to EOM with total A overall. Once on EOM worked on dynamic and static sitting balance with pt requiring at least MIN A for static sitting balance d/t posterior lean. Provided PROM to neck via cervical rotation and lateral flexion to R and L side. Pt completed multiple sit>stands from EOM with an emphasis on NMR, pt required total A for sit>stands with pt noted to lean heavily posteriorly.   Pt transported back to room with total A with pt left seated in TIS with alarm belt activated and all needs within reach.   Therapy Documentation Precautions:  Precautions Precautions: Fall Precaution Comments: Contact precautions & delayed processing Restrictions Weight Bearing Restrictions: No  Pain: pt noted to grimace at times, provided rest breaks as needed.     Therapy/Group: Individual Therapy  [name] [facility] 12/26/2022, 12:14 PM

## 2022-12-26 NOTE — Progress Notes (Signed)
PROGRESS NOTE   Subjective/Complaints: Received report from Budd Palmer that patient said "ow" twice yesterday Spasticity appears painful when ranging patient  ROS: Limited by cognition/behavioral Objective:   No results found. No results for input(s): "WBC", "HGB", "HCT", "PLT" in the last 72 hours.   No results for input(s): "NA", "K", "CL", "CO2", "GLUCOSE", "BUN", "CREATININE", "CALCIUM" in the last 72 hours.     Intake/Output Summary (Last 24 hours) at 12/26/2022 1230 Last data filed at 12/26/2022 0747 Gross per 24 hour  Intake 1030 ml  Output --  Net 1030 ml      Pressure Injury 10/31/22 Buttocks Left Unstageable - Full thickness tissue loss in which the base of the injury is covered by slough (yellow, tan, gray, green or brown) and/or eschar (tan, brown or black) in the wound bed. (Active)  10/31/22 2000  Location: Buttocks  Location Orientation: Left  Staging: Unstageable - Full thickness tissue loss in which the base of the injury is covered by slough (yellow, tan, gray, green or brown) and/or eschar (tan, brown or black) in the wound bed.  Wound Description (Comments):   Present on Admission: Yes     Pressure Injury 11/18/22 Toe (Comment  which one) Anterior;Right Deep Tissue Pressure Injury - Purple or maroon localized area of discolored intact skin or blood-filled blister due to damage of underlying soft tissue from pressure and/or shear. small are (Active)  11/18/22 1300  Location: Toe (Comment  which one)  Location Orientation: Anterior;Right  Staging: Deep Tissue Pressure Injury - Purple or maroon localized area of discolored intact skin or blood-filled blister due to damage of underlying soft tissue from pressure and/or shear.  Wound Description (Comments): small area on right great toe metatarsal  Present on Admission: Yes    Physical Exam: Vital Signs Blood pressure 106/79, pulse 83, temperature 98.3 F  (36.8 C), resp. rate 17, height 5\' 1"  (1.549 m), weight 44.8 kg, SpO2 98%. Gen: no distress, normal appearing HEENT: oral mucosa pink and moist, NCAT Cardio: Reg rate Chest: normal effort, normal rate of breathing Abd: soft, non-distended Ext: no edema  Psychiatric: very flat Neurological: nonverbal- focused on staring- didn't really acknowledge I was there, nursing said at baseline- Skin:  + Scaling dried skin on head - stable, stage 2 to right toe, area of redness between left thumb and index finger-covered in dressing, left buttock unstageable  Neurologic: Does not respond to orientation questions, does not track, does not follow cues. Does not follow verbal commands  Tone: MAS 3 right elbow, B knee flexors, and right hip; MAS 3 L elbow , MS 3-4 in wrist and finger flexors  Motor 0/5 strength in BUE and BUE  Cerebellar exam unable to assess  Musculoskeletal: reduced bilateral shoulder and elbow ROM due to increased tone  RIght Elbow brace in place Strong neck flexion Right wrist swelling   Assessment/Plan: 1. Functional deficits which require 3+ hours per day of interdisciplinary therapy in a comprehensive inpatient rehab setting. Physiatrist is providing close team supervision and 24 hour management of active medical problems listed below. Physiatrist and rehab team continue to assess barriers to discharge/monitor patient progress toward functional and medical goals  Care  Tool:  Bathing    Body parts bathed by patient: Chest, Face   Body parts bathed by helper: Right arm, Left arm, Chest, Abdomen, Front perineal area, Buttocks, Right upper leg, Left upper leg, Right lower leg, Left lower leg, Face Body parts n/a: Right arm, Right upper leg, Left arm, Left upper leg, Chest, Abdomen, Right lower leg, Front perineal area, Left lower leg, Buttocks, Face   Bathing assist Assist Level: Dependent - Patient 0%     Upper Body Dressing/Undressing Upper body dressing   What is the  patient wearing?: Hospital gown only    Upper body assist Assist Level: Dependent - Patient 0%    Lower Body Dressing/Undressing Lower body dressing      What is the patient wearing?: Incontinence brief     Lower body assist Assist for lower body dressing: Dependent - Patient 0%     Toileting Toileting Toileting Activity did not occur (Clothing management and hygiene only): N/A (no void or bm)  Toileting assist Assist for toileting: Dependent - Patient 0%     Transfers Chair/bed transfer  Transfers assist  Chair/bed transfer activity did not occur: Safety/medical concerns  Chair/bed transfer assist level: Maximal Assistance - Patient 25 - 49%     Locomotion Ambulation   Ambulation assist   Ambulation activity did not occur: Safety/medical concerns          Walk 10 feet activity   Assist  Walk 10 feet activity did not occur: Safety/medical concerns        Walk 50 feet activity   Assist Walk 50 feet with 2 turns activity did not occur: Safety/medical concerns         Walk 150 feet activity   Assist Walk 150 feet activity did not occur: Safety/medical concerns         Walk 10 feet on uneven surface  activity   Assist Walk 10 feet on uneven surfaces activity did not occur: Safety/medical concerns         Wheelchair     Assist Is the patient using a wheelchair?: Yes Type of Wheelchair: Manual (tilt in space)    Wheelchair assist level: Dependent - Patient 0%      Wheelchair 50 feet with 2 turns activity    Assist    Wheelchair 50 feet with 2 turns activity did not occur: Safety/medical concerns   Assist Level: Dependent - Patient 0%   Wheelchair 150 feet activity     Assist  Wheelchair 150 feet activity did not occur: Safety/medical concerns   Assist Level: Dependent - Patient 0%   Blood pressure 106/79, pulse 83, temperature 98.3 F (36.8 C), resp. rate 17, height 5\' 1"  (1.549 m), weight 44.8 kg, SpO2  98%.  Medical Problem List and Plan: 1. 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  Impaired mobility: continue Lovenox, weekly creatinine ordered             -antiplatelet therapy: N/A 3. Pain Management: Tylenol as needed 4. Mood/Behavior/Sleep: Provide emotional support             -antipsychotic agents: N/A  5. Neuropsych/cognition: This patient is not capable of making decisions on her own behalf. 6. Left buttock unstageable ulcer.  Continue Medihoney to buttock wound daily cover with foam dressing.  Changing dressing every 3 days or as needed soiling   7. Fluids/Electrolytes/Nutrition: Routine in and outs with follow-up chemistries  -Labs 7/21 stable.  -Eating 100% most meals with total assist  Last labs 8/7- look good  8/11- eating well per nursing 8.  Cavitary right lower lobe pneumonia likely aspiration pneumonia/MRSA pneumonia.  Resolved    Latest Ref Rng & Units 12/20/2022   10:54 AM 12/11/2022    7:15 AM 12/03/2022    7:18 AM  CBC  WBC 4.0 - 10.5 K/uL 5.6  9.0  8.7   Hemoglobin 12.0 - 15.0 g/dL 16.1  09.6  04.5   Hematocrit 36.0 - 46.0 % 42.8   45.1  39.7   Platelets 150 - 400 K/uL 253  329  218      9.  History of drug abuse.  Positive cocaine on urine drug screen.  Provide counseling  10.  AKI/hypovolemia and ATN.  resolved    Latest Ref Rng & Units 12/22/2022   12:16 PM 12/20/2022   10:54 AM 12/15/2022    7:12 AM  BMP  Glucose 70 - 99 mg/dL  409    BUN 6 - 20 mg/dL  9    Creatinine 8.11 - 1.00 mg/dL 9.14  7.82  9.56   Sodium 135 - 145 mmol/L  137    Potassium 3.5 - 5.1 mmol/L  3.9    Chloride 98 - 111 mmol/L  101    CO2 22 - 32 mmol/L  22    Calcium 8.9 - 10.3 mg/dL  9.1    Recheck labs tomorrow ordered  11.  Mild transaminitis with rhabdomyolysis.  Both resolved  12.  Hypotension. Resolved, d/c midodrine  13. Impaired initiation: continue amantadine back to 100mg  BID  14. Suboptimal vitamin D: continue 1,000U D3 daily  15. Spasticity:   -Remains extremely tight, decrease baclofen to 15mg  TID due to fatigue. Appears to have increased sedation with this increase, continue to monitor. Continue wrist, elbow brace, and PRAFOs. Add tizanidine 2mg  HS  8/10- suggest low dose Dantrolene or in the long run, a Baclofen pump to be assessed in clinic? 16. Bowel and bladder incontinence: continue bowel and bladder program  -LBM 8/5   8/10- LBM x3 in last 12 hours- small- hard--will add Colace 100 mg daily  8/11- Medium BM today- incontinent- better/less hard than yesterday with Colace 17. Left hand skin maceration: Eucerin ordered, discussed hand split with OT, conitnue  18. Tachycardia: mild,~90bpm decreased amantadine back to 100mg  BID, continue this doseHR stable in 80s Q8/10- BP a little elvated- will con't to monitor for trend    12/26/2022    6:54 AM 12/25/2022    8:04 PM 12/25/2022    4:16 PM  Vitals with BMI  Systolic 106 110 213  Diastolic 79 84  90  Pulse 83 78 81     19. History of elevated SBP: BP reviewed and is stable  20. Area of redness between left thumb and index finger: Continue foam dressing  21.  Scalp eczema: selsun blue ordered, continue  22. Fatigue: improved, discussed with team may be secondary to anti-spasticity medications, B12 reviewed and is 335, in suboptimal range, will give 1,000U B12 injection 8/7  23. Right wrist swelling: XR ordered and shows no acute fracture, shows 2.10mm ulnar variance, brace removed to given patient a break from it. Voltaren gel ordered prn for discomfort, continue  23. Dysphagia: continue D3 diet  LOS: 38 days A FACE TO FACE EVALUATION WAS PERFORMED   P  12/26/2022, 12:30 PM

## 2022-12-26 NOTE — Progress Notes (Signed)
Speech Language Pathology Weekly Progress and Session Note  Patient Details  Name: Nicole [name] MRN: [id number] Date of Birth: 1975-09-07  Beginning of progress report period: December 19, 2022 End of progress report period: December 26, 2022  Today's Date: 12/26/2022 SLP Individual Time: 1000-1100 SLP Individual Time Calculation (min): 60 min  Short Term Goals: Week 5: SLP Short Term Goal 1 (Week 5): Pt will present w/ appropriate response to functional stimuli (i.e. accept spoon, grasp basic item appropriately, response to tactile/auditory stimuli) 15% of the time w/ maxA multimodal cues SLP Short Term Goal 1 - Progress (Week 5): Not met SLP Short Term Goal 2 (Week 5): Patient will follow 1-step commands within a context in 5% of opportunities with Max A multimodal cues. SLP Short Term Goal 2 - Progress (Week 5): Not met SLP Short Term Goal 3 (Week 5): Patient will utilize multimodal communication to answer basic yes/no questions with maxA in 5% of opportunities SLP Short Term Goal 3 - Progress (Week 5): Not met    New Short Term Goals: Week 6: SLP Short Term Goal 1 (Week 6): Pt will present w/ appropriate response to functional stimuli (i.e. accept spoon, grasp basic item appropriately, response to tactile/auditory stimuli) 15% of the time w/ maxA multimodal cues SLP Short Term Goal 2 (Week 6): Patient will follow 1-step commands within a context in 5% of opportunities with Max A multimodal cues. SLP Short Term Goal 3 (Week 6): Patient will utilize multimodal communication to answer basic yes/no questions with maxA in 5% of opportunities  Weekly Progress Updates: Pt has made limited gains and has met 0 of 3 STG's this reporting period. Pt presents with limited initiation of communication; verbal and nonverbal. However, pt with observed appropriate vocalizations in response to pain this reporting period. Pt continued to be total A requiring assist with communication and following directions.  She warranted hand over hand assist in all opportunities. Despite minimal completion of following commands pt presented with groping/ attempted movements made toward completion of some commands. Pt/ family education ongoing. Pt would benefit from continued skilled SLP intervention to maximize cognition and language in order to maximize her functional independence prior to discharge.   Intensity: Minumum of 1-2 x/day, 30 to 90 minutes Frequency: 1 to 3 out of 7 days Duration/Length of Stay: awaiting SNF placement Treatment/Interventions: Cognitive remediation/compensation;Dysphagia/aspiration precaution training;Internal/external aids;Speech/Language facilitation;Cueing hierarchy;Environmental controls;Therapeutic Activities;Functional tasks;Multimodal communication approach;Patient/family education;Therapeutic Exercise;DME/adaptive equipment instruction   Daily Session  Skilled Therapeutic Interventions: Pt was seen in am to address cognitive re- training. Pt was alert and seated upright in WC upon SLP arrival. R hand splint removed briefly to address use of functional items. Given suction toothbrush placed in her right hand, SLP provided total A with verbal instruction to continue task with no follow through. Toothbrush observed to fall into lateral sulcus on multiple attempts. SLP also addressed following directions. Pt observed with some groping or initiation of verbalization or command in 3/8 opportunities. Pt also noted to grope in response to automatic communication (I.e. clinician saying good morning). SLP placed marker in pt's right hand and provided hand over hand assist for tracing shapes and letters. Given use of errorless learning SLP also challenged pt in using multimodal communication to answer biographical questions given single option. Right hand splint placed at conclusion of session and pt left upright with call button within reach and chair alarm active. SLP to continue POC.      Pain Pain Assessment Pain Scale: 0-10 Pain Score:  0-No pain Faces Pain Scale: No hurt  Therapy/Group: Individual Therapy  [name] 12/26/2022, 2:27 PM

## 2022-12-26 NOTE — Progress Notes (Signed)
Physical Therapy Session Note  Patient Details  Name: Nicole [name] MRN: [id number] Date of Birth: [birth year]  Today's Date: 12/26/2022 PT Individual Time: 7829-5621 PT Individual Time Calculation (min): 73 min   Short Term Goals: Week 4:  PT Short Term Goal 1 (Week 4): Pt will initiate bed mobility but require no more than totalA. PT Short Term Goal 1 - Progress (Week 4): Not met PT Short Term Goal 2 (Week 4): Pt will maintain sitting balance unsupported for 30 seconds PT Short Term Goal 2 - Progress (Week 4): Progressing toward goal PT Short Term Goal 3 (Week 4): Pt will demonstrate command follow for ~15% of tasks. PT Short Term Goal 3 - Progress (Week 4): Not met Week 5:  PT Short Term Goal 1 (Week 5): Pt will initiate bed mobility requiring no more than totalA. PT Short Term Goal 2 (Week 5): Pt will maintain sitting balance unsupported for up to 30 seconds PT Short Term Goal 3 (Week 5): Pt will demonstrate command follow for >10% of tasks. PT Short Term Goal 4 (Week 5): [name] RLE will demonstrate improved joint mobility into knee/ hip extension and ankle DF to neutral.  Skilled Therapeutic Interventions/Progress Updates:  Patient supine in bed on entrance to room. Bed flat with slight HOB elevation and pt with head flexed forward. Patient awake/ alert and demos turn of eyes to L briefly with additional head turn upon verbal recognition of pt from therapist at door while donning PPE for contact precautions.    Patient with indication of mild pain complaint at start of session with verbalization similar to "ugh" with BLE positioning for initial ranging of joints and assessment of tone/ active contraction. No muscle activation but increase in tone toward flexed posturing. PRAFOs doffed and shoes donned DEP. Pt with incontinent BM in brief and performed self care with brief change and LB dressing while supine with overall DEP care.    With y/n question to patient re: pain and re: sleep, pt  demos head nod. Unsure if this is a new head movement or an increase in conscious/ awareness. Appeared to have some increases in ability to focus at start of session. Then fatigue in rest of session.    Attempt to engage pt throughout session with conversation.    Therapeutic Activity/ NMR: Transfers: Pt performed squat pivot from EOB to w/c with DEP. Sit <>stand from w/c to parallel bars with TotA/ DEP and no grasp to bars. Provided vc throughout for technique.  STS technique practiced with multimodal cueing and DEP assist to spend time in extension with foot flat and in attempt to engage pt in mobility. DEP for maintaining upright positioning.    NMR performed for improvements in motor control, cognition, and maintaining body mobility for future purposeful movements.    Seated LLE stretch into knee extension and ankle DF to prevent contracture. RLE elevating leg rest located and attached to w/c for improve positioning while seated upright in w/c. Rehab Tech  notified of need for L elevating leg rest.    Attempt to engage pt in head control/ eye mvmt toward conscious acknowledgement of items in front of her. No changes in position/ awareness noted throughout.    Pt positioned in seated position in TIS w/c with full lap tray donned. Elbow brace donned to LUE with WHO to R wrist between sessions. Rolled washcloth in L hand for improved breathability to skin d/t recent redness noted with consistent palm guard wear.    All  needs within reach. NT notified as to pt's disposition and potential need for personal care from potential UI/ BM.    Therapy Documentation Precautions:  Precautions Precautions: Fall Precaution Comments: Contact precautions & delayed processing Restrictions Weight Bearing Restrictions: No  Pain:  Minimal pain response to joint mobility at start of session that improves with continues mobility throughout session.   Therapy/Group: Individual Therapy  Loel Dubonnet PT,  DPT, CSRS 12/26/2022, 9:44 AM

## 2022-12-27 MED ORDER — TIZANIDINE HCL 4 MG PO TABS
4.0000 mg | ORAL_TABLET | Freq: Two times a day (BID) | ORAL | Status: DC
  Administered 2022-12-27 – 2023-01-08 (×24): 4 mg via ORAL
  Filled 2022-12-27 (×24): qty 1

## 2022-12-27 NOTE — Progress Notes (Signed)
Speech Language Pathology Daily Session Note  Patient Details  Name: Nicole [name] MRN: [id number] Date of Birth: 26-Oct-1975  Today's Date: 12/27/2022 SLP Individual Time: 1030-1105 SLP Individual Time Calculation (min): 35 min and Today's Date: 12/27/2022 SLP Missed Time: 10 Minutes Missed Time Reason: Patient fatigue  Short Term Goals: Week 6: SLP Short Term Goal 1 (Week 6): Pt will present w/ appropriate response to functional stimuli (i.e. accept spoon, grasp basic item appropriately, response to tactile/auditory stimuli) 15% of the time w/ maxA multimodal cues SLP Short Term Goal 2 (Week 6): Patient will follow 1-step commands within a context in 5% of opportunities with Max A multimodal cues. SLP Short Term Goal 3 (Week 6): Patient will utilize multimodal communication to answer basic yes/no questions with maxA in 5% of opportunities  Skilled Therapeutic Interventions: Skilled treatment session focused on cognitive/communication goals. Upon arrival, patient was awake while upright in bed. SLP facilitated session by providing hand over hand assist to initiate "hitting" a button to elicit a noise/response with focus on working towards establishing yes/no communication. Patient was able to hold the button with assistance but unable to provide initiation/strength to "hit" the button to produce a response.  SLP also provided yes/no signs embellished with symbols and colors that were placed separately to help distinguish a gaze towards a specific choice. Patient unable to utilize effectively despite Max A multimodal cues. Patient became lethargic at end of session and fell asleep with inability to rouse. Therefore, patient missed remaining 10 mins of session. Patient left in bed with all needs within reach. Continue with current plan of care.   Pain No indications of pain  Therapy/Group: Individual Therapy  ,  12/27/2022, 12:50 PM

## 2022-12-27 NOTE — Progress Notes (Signed)
PROGRESS NOTE   Subjective/Complaints: Appears comfortable this morning Smiling in response to Ot's greeting Verbalized "ow" twice earlier this week Discussed increase in baclofen  ROS: Limited by cognition/behavioral  Objective:   No results found. No results for input(s): "WBC", "HGB", "HCT", "PLT" in the last 72 hours.   No results for input(s): "NA", "K", "CL", "CO2", "GLUCOSE", "BUN", "CREATININE", "CALCIUM" in the last 72 hours.     Intake/Output Summary (Last 24 hours) at 12/27/2022 0919 Last data filed at 12/27/2022 8413 Gross per 24 hour  Intake 950 ml  Output --  Net 950 ml      Pressure Injury 10/31/22 Buttocks Left Unstageable - Full thickness tissue loss in which the base of the injury is covered by slough (yellow, tan, gray, green or brown) and/or eschar (tan, brown or black) in the wound bed. (Active)  10/31/22 2000  Location: Buttocks  Location Orientation: Left  Staging: Unstageable - Full thickness tissue loss in which the base of the injury is covered by slough (yellow, tan, gray, green or brown) and/or eschar (tan, brown or black) in the wound bed.  Wound Description (Comments):   Present on Admission: Yes     Pressure Injury 11/18/22 Toe (Comment  which one) Anterior;Right Deep Tissue Pressure Injury - Purple or maroon localized area of discolored intact skin or blood-filled blister due to damage of underlying soft tissue from pressure and/or shear. small are (Active)  11/18/22 1300  Location: Toe (Comment  which one)  Location Orientation: Anterior;Right  Staging: Deep Tissue Pressure Injury - Purple or maroon localized area of discolored intact skin or blood-filled blister due to damage of underlying soft tissue from pressure and/or shear.  Wound Description (Comments): small area on right great toe metatarsal  Present on Admission: Yes    Physical Exam: Vital Signs Blood pressure (!)  136/96, pulse 90, temperature 98.6 F (37 C), temperature source Oral, resp. rate 15, height 5\' 1"  (1.549 m), weight 44.8 kg, SpO2 96%. Gen: no distress, normal appearing HEENT: oral mucosa pink and moist, NCAT Cardio: Reg rate Chest: normal effort, normal rate of breathing Abd: soft, non-distended Ext: no edema Psych: pleasant, normal affect Skin: intact Psychiatric: very flat Neurological: nonverbal- focused on staring- didn't really acknowledge I was there, nursing said at baseline- Skin:  + Scaling dried skin on head - stable, stage 2 to right toe, area of redness between left thumb and index finger-covered in dressing, left buttock unstageable  Neurologic: Does not respond to orientation questions, does not track, does not follow cues. Does not follow verbal commands  Tone: MAS 3 right elbow, B knee flexors, and right hip; MAS 3 L elbow , MS 3-4 in wrist and finger flexors  Motor 0/5 strength in BUE and BUE  Cerebellar exam unable to assess  Musculoskeletal: reduced bilateral shoulder and elbow ROM due to increased tone  RIght Elbow brace in place Strong neck flexion Right wrist swelling   Assessment/Plan: 1. Functional deficits which require 3+ hours per day of interdisciplinary therapy in a comprehensive inpatient rehab setting. Physiatrist is providing close team supervision and 24 hour management of active medical problems listed below. Physiatrist and rehab team continue  to assess barriers to discharge/monitor patient progress toward functional and medical goals  Care Tool:  Bathing    Body parts bathed by patient: Chest, Face   Body parts bathed by helper: Right arm, Left arm, Chest, Abdomen, Front perineal area, Buttocks, Right upper leg, Left upper leg, Right lower leg, Left lower leg, Face Body parts n/a: Right arm, Right upper leg, Left arm, Left upper leg, Chest, Abdomen, Right lower leg, Front perineal area, Left lower leg, Buttocks, Face   Bathing assist Assist  Level: Dependent - Patient 0%     Upper Body Dressing/Undressing Upper body dressing   What is the patient wearing?: Hospital gown only    Upper body assist Assist Level: Dependent - Patient 0%    Lower Body Dressing/Undressing Lower body dressing      What is the patient wearing?: Incontinence brief     Lower body assist Assist for lower body dressing: Dependent - Patient 0%     Toileting Toileting Toileting Activity did not occur (Clothing management and hygiene only): N/A (no void or bm)  Toileting assist Assist for toileting: Dependent - Patient 0%     Transfers Chair/bed transfer  Transfers assist  Chair/bed transfer activity did not occur: Safety/medical concerns  Chair/bed transfer assist level: Maximal Assistance - Patient 25 - 49%     Locomotion Ambulation   Ambulation assist   Ambulation activity did not occur: Safety/medical concerns          Walk 10 feet activity   Assist  Walk 10 feet activity did not occur: Safety/medical concerns        Walk 50 feet activity   Assist Walk 50 feet with 2 turns activity did not occur: Safety/medical concerns         Walk 150 feet activity   Assist Walk 150 feet activity did not occur: Safety/medical concerns         Walk 10 feet on uneven surface  activity   Assist Walk 10 feet on uneven surfaces activity did not occur: Safety/medical concerns         Wheelchair     Assist Is the patient using a wheelchair?: Yes Type of Wheelchair: Manual (tilt in space)    Wheelchair assist level: Dependent - Patient 0%      Wheelchair 50 feet with 2 turns activity    Assist    Wheelchair 50 feet with 2 turns activity did not occur: Safety/medical concerns   Assist Level: Dependent - Patient 0%   Wheelchair 150 feet activity     Assist  Wheelchair 150 feet activity did not occur: Safety/medical concerns   Assist Level: Dependent - Patient 0%   Blood pressure (!)  136/96, pulse 90, temperature 98.6 F (37 C), temperature source Oral, resp. rate 15, height 5\' 1"  (1.549 m), weight 44.8 kg, SpO2 96%.  Medical Problem List and Plan: 1. 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  Impaired mobility: continue Lovenox, weekly creatinine ordered             -antiplatelet therapy: N/A 3. Pain Management: Tylenol as needed 4. Mood/Behavior/Sleep: Provide emotional support             -antipsychotic agents: N/A  5. Neuropsych/cognition: This patient is not capable of making decisions on her own behalf. 6. Left buttock unstageable ulcer.  Continue Medihoney to buttock wound daily cover with foam dressing.  Changing dressing every 3 days or as needed soiling   7. Fluids/Electrolytes/Nutrition: Routine in and outs with follow-up chemistries  -Labs 7/21 stable.  -Eating 100% most meals with total assist  Last labs 8/7- look good  8/11- eating well per nursing 8.  Cavitary right lower lobe pneumonia likely aspiration pneumonia/MRSA pneumonia.  Resolved    Latest Ref Rng & Units 12/20/2022   10:54 AM 12/11/2022    7:15 AM 12/03/2022     7:18 AM  CBC  WBC 4.0 - 10.5 K/uL 5.6  9.0  8.7   Hemoglobin 12.0 - 15.0 g/dL 24.4  01.0  27.2   Hematocrit 36.0 - 46.0 % 42.8  45.1  39.7   Platelets 150 - 400 K/uL 253  329  218      9.  History of drug abuse.  Positive cocaine on urine drug screen.  Provide counseling  10.  AKI/hypovolemia and ATN.  resolved    Latest Ref Rng & Units 12/22/2022   12:16 PM 12/20/2022   10:54 AM 12/15/2022    7:12 AM  BMP  Glucose 70 - 99 mg/dL  536    BUN 6 - 20 mg/dL  9    Creatinine 6.44 - 1.00 mg/dL 0.34  7.42  5.95   Sodium 135 - 145 mmol/L  137    Potassium 3.5 - 5.1 mmol/L  3.9    Chloride 98 - 111 mmol/L  101    CO2 22 - 32 mmol/L  22    Calcium 8.9 - 10.3 mg/dL  9.1    Recheck labs tomorrow ordered  11.  Mild transaminitis with rhabdomyolysis.  Both resolved  12.  Hypotension. Resolved, d/c midodrine  13. Impaired initiation: continue amantadine back to 100mg  BID  14. Suboptimal vitamin D: continue 1,000U D3 daily  15. Spasticity:   -increased baclofen to 20mg  TID and tizanidine to 4mg  BID. Continue wrist, elbow brace, and PRAFOs. Add tizanidine 2mg  HS  8/10- suggest low dose Dantrolene or in the long run, a Baclofen pump to be assessed in clinic? 16. Bowel and bladder incontinence: continue bowel and bladder program  -LBM 8/5   8/10- LBM x3 in last 12 hours- small- hard--will add Colace 100 mg daily  8/11- Medium BM today- incontinent- better/less hard than yesterday with Colace 17. Left hand skin maceration: Eucerin ordered, discussed hand split with OT, conitnue  18. Tachycardia: mild,~90bpm decreased amantadine back to 100mg  BID, continue this doseHR stable in 80s Q8/10- BP a little elvated- will con't to monitor for trend    12/27/2022    5:21 AM 12/26/2022    9:47 PM 12/26/2022    9:46 PM  Vitals with BMI  Systolic 136  149 152  Diastolic 96 97 110  Pulse 90 90 94     19. History of elevated SBP: Increased tizanidine to 4mg  BID  20. Area of redness between left thumb  and index finger: Continue foam dressing  21. Scalp eczema: selsun blue ordered, continue  22. Fatigue: improved, discussed with team may be secondary to anti-spasticity medications, B12 reviewed and is 335, in suboptimal range, will give 1,000U B12 injection 8/7  23. Right wrist swelling: XR ordered and shows no acute fracture, shows 2.72mm ulnar variance, brace removed to given patient a break from it. Voltaren gel ordered prn for discomfort, continue  23. Dysphagia: continue D3 diet  LOS: 39 days A FACE TO FACE EVALUATION WAS PERFORMED  Drema Pry  12/27/2022, 9:19 AM

## 2022-12-27 NOTE — Progress Notes (Signed)
Physical Therapy Session Note  Patient Details  Name: Nicole [name] MRN: [id number] Date of Birth: 07-24-1975  {CHL IP REHAB PT TIME CALCULATION:304800500}  Short Term Goals: {VHQ:4696295}  Skilled Therapeutic Interventions/Progress Updates:      Therapy Documentation Precautions:  Precautions Precautions: Fall Precaution Comments: Contact precautions & delayed processing Restrictions Weight Bearing Restrictions: No General:   Vital Signs: Therapy Vitals Temp: 98.6 F (37 C) Temp Source: Oral Pulse Rate: 90 Resp: 15 BP: (!) 136/96 Patient Position (if appropriate): Lying Oxygen Therapy SpO2: 96 % O2 Device: Room Air Pain:   Mobility:   Locomotion :    Trunk/Postural Assessment :    Balance:   Exercises:   Other Treatments:      Therapy/Group: {Therapy/Group:[id number]}  Loel Dubonnet 12/27/2022, 9:01 AM

## 2022-12-27 NOTE — Progress Notes (Signed)
Occupational Therapy Weekly Progress Note  Patient Details  Name: Nicole [name] MRN: [id number] Date of Birth: November 07, 1975  Beginning of progress report period: December 20, 2022 End of progress report period: December 27, 2022  Today's Date: 12/27/2022 OT Individual Time: 0817-0900 OT Individual Time Calculation (min): 43 min    Patient has met 2 of 3 short term goals.  Some improvement noted in focused attention and tolerance to passive range of motion of UE's  Patient continues to demonstrate the following deficits: muscle weakness, muscle joint tightness, and muscle paralysis, abnormal tone, unbalanced muscle activation, motor apraxia, decreased coordination, and decreased motor planning, decreased visual perceptual skills and decreased visual motor skills, decreased motor planning and ideational apraxia, decreased initiation, decreased attention, decreased awareness, decreased problem solving, decreased safety awareness, decreased memory, and delayed processing, and decreased sitting balance, decreased standing balance, decreased postural control, hemiplegia, and decreased balance strategies and therefore will continue to benefit from skilled OT intervention to enhance overall performance with BADL and Reduce care partner burden.  Patient progressing toward long term goals..  Continue plan of care.  OT Short Term Goals Week 5:  OT Short Term Goal 1 (Week 5): Patient will sit unsupported at edge of bed x 15 seconds in preparation for level surface transfer OT Short Term Goal 1 - Progress (Week 5): Progressing toward goal OT Short Term Goal 2 (Week 5): Patient will demonstrate improved focused attention as evidenced by sustaining eye contact x at least 10 seconds while in supported position. OT Short Term Goal 2 - Progress (Week 5): Met OT Short Term Goal 3 (Week 5): Patient will demonstrate improved passive/active range of motion in right fingers to allow improved functional use of RUE for  participation in BADL OT Short Term Goal 3 - Progress (Week 5): Met Week 6:  OT Short Term Goal 1 (Week 6): Patient will sit unsupported at edge of bed x 15 seconds in preparation for level surface transfer OT Short Term Goal 2 (Week 6): Patient will demonstrate improved focused attention as evidenced by sustaining eye contact x at least 20 seconds while in supported position. OT Short Term Goal 3 (Week 6): Patient will demonstrate 71* of elbow extension in BUE to reduce caregiver burden with bathing and dressing care  Skilled Therapeutic Interventions/Progress Updates:    Patient received supine in bed.  Brief clean and dry.  Patient very alert and tracking left and right to maintain eye contact with therapist.  Patient smiled x 2, and laughed x 1 this session in response to small and laughing in close proximity.  Patient assisted to flat bed - supine position and worked to help head relax onto pillow reducing neck flexion.   Attempted to have patient follow physical commands - open mouth, close eyes, stick out tongue.  Patient has motoric response - but incorrect for command.  Dressed patient's lower body in bed, and patient able to assist with rolling left and right (needs max assist) Transferred patient to wheelchair with max assist, and dressed upper body.  Cleaned teeth, and provided water to drink.  Patient frowning with range of motion to indicate discomfort - did better with slow stretch, gentle, consistent pressure.   Left up in wheelchair with safety belt in place and direct hand off to PT.    Therapy Documentation Precautions:  Precautions Precautions: Fall Precaution Comments: Contact precautions & delayed processing Restrictions Weight Bearing Restrictions: No   Pain:  Frowns and pulls forward to show pain with range  of motion.  Does better with slow gentle stretch    Therapy/Group: Individual Therapy  [name] 12/27/2022, 12:51 PM

## 2022-12-27 NOTE — Patient Care Conference (Signed)
Inpatient RehabilitationTeam Conference and Plan of Care Update Date: 12/27/2022   Time: 11:09 AM    Patient Name: Nicole [name]      Medical Record Number: [id number]  Date of Birth: 11-23-1975 Sex: Female         Room/Bed: 4W09C/4W09C-01 Payor Info: Payor: [employer] / Plan: [employer] / Product Type: *No Product type* /    Admit Date/Time:  11/18/2022  3:15 PM  Primary Diagnosis:  Hypoxic brain injury [facility])  Hospital Problems: Principal Problem:   Hypoxic brain injury [facility])    Expected Discharge Date: Expected Discharge Date:  (SNF pending)  Team Members Present: Physician leading conference: Dr. Sula Soda Social Worker Present: Dossie Der, LCSW Nurse Present: Chana Bode, RN PT Present: Casimiro Needle, PT OT Present: Bretta Bang, OT SLP Present: [name], SLP PPS Coordinator present : Fae Pippin, SLP     Current Status/Progress Goal Weekly Team Focus  Bowel/Bladder   Incontience of bowel and bladder.   Work towards gaining control of bowel and bladder.   Assist with tolieting needs QShift and as needed.    Swallow/Nutrition/ Hydration               ADL's   dependent BADL, Occasional vocalization to pain stimulus   max / mod assist   focused attention, improve local response to stimulus    Mobility   Continued DEP care for all functional mobility. New attempts to vocalize this week. minimal arm movements initated into reflexive catch in response to controlled forward LOB. Potential volitional head nods with acknowledgement(?), Severely flexed posturing with bracing applied to ankles, wrists, elbows and neck. Continued ability to turn eyes and neck to scan L on hearing voice to L. Increasing flexor tone noted in RLE.   TotA overall  Barriers: extremely low awareness, flexed posturing, rare volitional movements /// Continued focus on bed mobility, sitting balance, improving alertness and volitional movements, functional transfers,  stretching into extension, any NMR and cueing for volitional movements/ motor control, DEP ambulation    Communication                Safety/Cognition/ Behavioral Observations               Pain                Skin   Stage 2 on left buttocks; medihoney/foam   Remain free of any new skin breakdown/infection risk  Assess QS and PRN as needed      Discharge Planning:  Mom continues to work on long term care Medicaid may take up to 90 days have to wait for disability to determine hopefully it will not due to severity of pt's condition. [address] date 8/25.   Team Discussion: Patient post hypoxic brain injury; MD adjusting medications with elevated BP and continues on amantadine. Treatment for fungal infection and scalp eczema.    Patient on target to meet rehab goals: Currently dependent for care although able to move spoon to mouth once loaded and vocalizing to hand stimuli and demonstrating improved eye contact. Also noted reflexive catch movement and ocassional head nods. Continue to note little physical movement with increased flexor tone in right LE and ankles and flexed posture. Brace to neck, wrist, etc; ? [employer].  Goals for discharge set for mod - max assist.   *See Care Plan and progress notes for long and short-term goals.   Revisions to Treatment Plan:  LTC medicaid application  Teaching Needs: Safety, medications, dietary modification, wound/skin care, transfers, toileting, etc,  Current Barriers to Discharge: Home enviroment access/layout, Incontinence, Wound care, Lack of/limited family support, and Insurance for SNF coverage  Possible Resolutions to Barriers: SNF recommended     Medical Summary Current Status: uncontrolled hypertension, spasticity, neurogenic bowel and bladder, decreased initiation, moisture buildup between fingers, fatigue  Barriers to Discharge: Medical stability;Uncontrolled Hypertension;Spasticity;Neurogenic Bowel &  Bladder  Barriers to Discharge Comments: uncontrolled hypertension, spasticity, neurogenic bowel and bladder, decreased initiation, moisture buildup between fingers, fatigue Possible Resolutions to [employer]: continue to monitor blood pressure TID, tizanidine increased to 4mg  BID, baclofen increased to 20mg  TID, continue foam dressing, continue amantadine   Continued Need for Acute Rehabilitation Level of Care: The patient requires daily medical management by a physician with specialized training in physical medicine and rehabilitation for the following reasons: Direction of a multidisciplinary physical rehabilitation program to maximize functional independence : Yes Medical management of patient stability for increased activity during participation in an intensive rehabilitation regime.: Yes Analysis of laboratory values and/or radiology reports with any subsequent need for medication adjustment and/or medical intervention. : Yes   I attest that I was present, lead the team conference, and concur with the assessment and plan of the team.   Chana Bode B 12/27/2022, 4:19 PM

## 2022-12-27 NOTE — Progress Notes (Signed)
Physical Therapy Session Note  Patient Details  Name: Nicole [name] MRN: [id number] Date of Birth: 10/11/1975  Today's Date: 12/27/2022 PT Individual Time: 6295-2841 PT Individual Time Calculation (min): 43 min   Short Term Goals: Week 5:  PT Short Term Goal 1 (Week 5): Pt will initiate bed mobility requiring no more than totalA. PT Short Term Goal 2 (Week 5): Pt will maintain sitting balance unsupported for up to 30 seconds PT Short Term Goal 3 (Week 5): Pt will demonstrate command follow for >10% of tasks. PT Short Term Goal 4 (Week 5): [name] RLE will demonstrate improved joint mobility into knee/ hip extension and ankle DF to neutral.  Skilled Therapeutic Interventions/Progress Updates:     Pt received supine in bed and in no apparent distress or pain. PT removes soft PRAFOs and provides totalA for supine to sit with cues for sequencing and positioning. Pt completes Stand pivot transfer from bed to [facility] with totalA and cues for sequencing and initiation. WC transport to gym for time management. Session with emphasis on WB through BLEs and activity tolerance via standing. Pt performs x2 reps of sit to stand with totalA +2, with PT providing manual facilitation of hip and trunk extension, as well as blocking both knees to prevent buckling. Rehab tech positioned behind pt and assists with hip extension as well as symmetrical WB and ensuring heels are in contact with floor through BLEs. Extended seated rest break between bouts. Pt maintains standing ~1 minute each trials. WC transport back to room. Stand pivot to bed with totalA and use of exercise step. Pt left supine with alarm intact and call bell in reach.   Therapy Documentation Precautions:  Precautions Precautions: Fall Precaution Comments: Contact precautions & delayed processing Restrictions Weight Bearing Restrictions: No   Therapy/Group: Individual Therapy  Beau Fanny, PT, DPT 12/27/2022, 5:02 PM

## 2022-12-28 NOTE — Progress Notes (Signed)
Physical Therapy Session Note  Patient Details  Name: Nicole [name] MRN: [id number] Date of Birth: 01-20-1976  Today's Date: 12/28/2022 PT Individual Time: 1105-1200 PT Individual Time Calculation (min): 55 min   Short Term Goals: Week 5:  PT Short Term Goal 1 (Week 5): Pt will initiate bed mobility requiring no more than totalA. PT Short Term Goal 2 (Week 5): Pt will maintain sitting balance unsupported for up to 30 seconds PT Short Term Goal 3 (Week 5): Pt will demonstrate command follow for >10% of tasks. PT Short Term Goal 4 (Week 5): [name] RLE will demonstrate improved joint mobility into knee/ hip extension and ankle DF to neutral.  Skilled Therapeutic Interventions/Progress Updates:     Pt received seated in WC and in no apparent distress or pain. WC transport to gym. PT and rehab tech perform dependent lift transfer from [facility] to tilt table, providing cues for initiation. Pt participates in tilt table to promote increased WB through BLEs as well as promoting ability to achieve neutral positioning of ankles and lower extremities, and limiting tone through lower extremities. Pt able to come to nearly 90 degrees on tilt table with no apparent distress. PT places beach ball between knees to promote neutral hip position, as well as providing tactile cueing through feet to promote increased dorsiflexion. Pt noted to have cervical flexion and right rotation, so PT places washcloth over forehead and uses gait belt to promote neutral cervical posture. Pt appears to tolerate activity well and nods to therapist several times in response to questions. PT and tech lift pt back to WC. Pt left seated in tilt in space with lap tray in place and alarm intact.   Therapy Documentation Precautions:  Precautions Precautions: Fall Precaution Comments: Contact precautions & delayed processing Restrictions Weight Bearing Restrictions: No   Therapy/Group: Individual Therapy  Beau Fanny, PT,  DPT 12/28/2022, 4:00 PM

## 2022-12-28 NOTE — Progress Notes (Signed)
Occupational Therapy Session Note  Patient Details  Name: BRANY LINSEY MRN: [id number] Date of Birth: 03/30/1976  Today's Date: 12/28/2022 OT Individual Time: 7829-5621 OT Individual Time Calculation (min): 57 min    Short Term Goals: Week 6:  OT Short Term Goal 1 (Week 6): Patient will sit unsupported at edge of bed x 15 seconds in preparation for level surface transfer OT Short Term Goal 2 (Week 6): Patient will demonstrate improved focused attention as evidenced by sustaining eye contact x at least 20 seconds while in supported position. OT Short Term Goal 3 (Week 6): Patient will demonstrate 82* of elbow extension in BUE to reduce caregiver burden with bathing and dressing care  Skilled Therapeutic Interventions/Progress Updates:    Patient received curled up into flexion in bed with soft braces on BLE.  Patient alert, head wet with sweat.  Patient wincing when attempts made to straighten legs to remove braces.  Patient did better with gentle stretching of BLE.  Assisted patient to sit at edge of bed - total assist.  Continue to work on rolling left/right.   Assisted patient to rolling shower chair with total assist, and showered patient with total assist.  Patient brings right hand to hair and scrubs head with RUE.   Assisted patient to stand to allow second caregiver to switch out shower seat for wheelchair.  Completed dressing and grooming while seated in chair. Transported patient to gym to address range of motion.  Worked supine with gentle rocking, facilitation to achieve head on surface.  Worked on use of breathing to aide with relaxation to allow stretch to BUE.  Used body over right arm to increase shoulder elbow range of motion.  Transported back to room - brace on left elbow, left palm protector, right resting hand splint pillow between knees, lap tray and safety belt in place/engaged. Call bell on table.    Therapy Documentation Precautions:  Precautions Precautions:  Fall Precaution Comments: Contact precautions & delayed processing Restrictions Weight Bearing Restrictions: No   Pain: Pain Assessment Pain Scale: 0-10 Pain Score: 0-No pain         Therapy/Group: Individual Therapy  [name] 12/28/2022, 12:10 PM

## 2022-12-28 NOTE — Progress Notes (Signed)
Physical Therapy Session Note  Patient Details  Name: Nicole [name] MRN: [id number] Date of Birth: 1975-07-12  Today's Date: 12/28/2022 PT Individual Time: 4098-1191 PT Individual Time Calculation (min): 43 min   Short Term Goals: Week 5:  PT Short Term Goal 1 (Week 5): Pt will initiate bed mobility requiring no more than totalA. PT Short Term Goal 2 (Week 5): Pt will maintain sitting balance unsupported for up to 30 seconds PT Short Term Goal 3 (Week 5): Pt will demonstrate command follow for >10% of tasks. PT Short Term Goal 4 (Week 5): [name] RLE will demonstrate improved joint mobility into knee/ hip extension and ankle DF to neutral.  Skilled Therapeutic Interventions/Progress Updates: Pt presents sitting in TIS, reclined back.  When PT called from doorway, pt turned to left and looked, but doesn't maintain.  Pt wheeled to small gym.  Pt transferred w/ total A to mat table.  +2 to maintain seated balance for PT positioning behind on T-ball for stretches to chest to maintain upright posture.  Pt received stretches to c/spine into extension and to left.  Pt does not maintain neutral head position after stretch released.  Pt attempts to pull into flexion.  Pt returned to room for dependent transfer w/c > bed and then to supine .  Pt positioned w/ B PRAFOs maintaining neutral rotation and knee extension.  HOB remained almost flat for c/spine extension, although still pulls to Right.  NT notified of positioning.      Therapy Documentation Precautions:  Precautions Precautions: Fall Precaution Comments: Contact precautions & delayed processing Restrictions Weight Bearing Restrictions: No General:   Vital Signs: Therapy Vitals Temp: 98.5 F (36.9 C) Temp Source: Oral Pulse Rate: 91 Resp: 16 BP: 125/83 Patient Position (if appropriate): Sitting Oxygen Therapy SpO2: 100 % O2 Device: Room Air Pain: grimacing w/ c/spine stretches into extension.      Therapy/Group: Individual  Therapy  [name] 12/28/2022, 3:00 PM

## 2022-12-28 NOTE — Progress Notes (Signed)
Physical Therapy Session Note  Patient Details  Name: Nicole [name] MRN: [id number] Date of Birth: Jan 21, 1976  Today's Date: 12/28/2022 PT Individual Time: 0920-1005 PT Individual Time Calculation (min): 45 min   Short Term Goals: Week 5:  PT Short Term Goal 1 (Week 5): Pt will initiate bed mobility requiring no more than totalA. PT Short Term Goal 2 (Week 5): Pt will maintain sitting balance unsupported for up to 30 seconds PT Short Term Goal 3 (Week 5): Pt will demonstrate command follow for >10% of tasks. PT Short Term Goal 4 (Week 5): [name] RLE will demonstrate improved joint mobility into knee/ hip extension and ankle DF to neutral.  Skilled Therapeutic Interventions/Progress Updates: Patient in TIS with RN present providing medication on entrance to room. Patient alert and agreeable to PT session.   Patient gave no indications of pain at beginning of session. Pt had increased mucus in B nostrils (white and clear - attending RN notified).   Neuromuscular Re-ed: NMR facilitated during session with focus on B WB for proprioceptive feedback. - Pt in standing frame with hip harness donned. Pt in standing position with noted B flexor tone. PTA performed PROM with light message to inhibit toned musculature. Pt in standing frame for 15 minutes with B UE on towel to move into horizontal adduction in order to stretch pectoralis. Pt cued to bring downward gaze upward to look outside (had one moment where attempted to, but did not maintain). Pt with rolled up towel underneath B heels for comfort as pt presented with plantarflexors tone.  NMR performed for improvements in motor control and coordination, balance, sequencing, judgement, and self confidence/ efficacy in performing all aspects of mobility at highest level of independence.    Patient in TIS at end of session with brakes locked, belt alarm set, and all needs within reach.      Therapy Documentation Precautions:   Precautions Precautions: Fall Precaution Comments: Contact precautions & delayed processing Restrictions Weight Bearing Restrictions: No   Therapy/Group: Individual Therapy    PTA 12/28/2022, 12:13 PM

## 2022-12-28 NOTE — Progress Notes (Signed)
Patient ID: Nicole [name], female   DOB: March 19, 1976, 47 y.o.   MRN: [id number]  Updated Mom regarding team conference yesterday. Working on long term medicaid and getting her disability approved so can go to SNF.

## 2022-12-28 NOTE — Progress Notes (Signed)
PROGRESS NOTE   Subjective/Complaints: Patient's chart reviewed- No issues reported overnight Vitals signs stable  Tolerated therapy yesterday  ROS: Limited by cognition/behavioral  Objective:   No results found. No results for input(s): "WBC", "HGB", "HCT", "PLT" in the last 72 hours.   No results for input(s): "NA", "K", "CL", "CO2", "GLUCOSE", "BUN", "CREATININE", "CALCIUM" in the last 72 hours.     Intake/Output Summary (Last 24 hours) at 12/28/2022 0920 Last data filed at 12/28/2022 0849 Gross per 24 hour  Intake 717 ml  Output --  Net 717 ml      Pressure Injury 10/31/22 Buttocks Left Unstageable - Full thickness tissue loss in which the base of the injury is covered by slough (yellow, tan, gray, green or brown) and/or eschar (tan, brown or black) in the wound bed. (Active)  10/31/22 2000  Location: Buttocks  Location Orientation: Left  Staging: Unstageable - Full thickness tissue loss in which the base of the injury is covered by slough (yellow, tan, gray, green or brown) and/or eschar (tan, brown or black) in the wound bed.  Wound Description (Comments):   Present on Admission: Yes     Pressure Injury 11/18/22 Toe (Comment  which one) Anterior;Right Deep Tissue Pressure Injury - Purple or maroon localized area of discolored intact skin or blood-filled blister due to damage of underlying soft tissue from pressure and/or shear. small are (Active)  11/18/22 1300  Location: Toe (Comment  which one)  Location Orientation: Anterior;Right  Staging: Deep Tissue Pressure Injury - Purple or maroon localized area of discolored intact skin or blood-filled blister due to damage of underlying soft tissue from pressure and/or shear.  Wound Description (Comments): small area on right great toe metatarsal  Present on Admission: Yes    Physical Exam: Vital Signs Blood pressure 139/85, pulse 90, temperature 97.6 F (36.4  C), temperature source Oral, resp. rate 18, height 5\' 1"  (1.549 m), weight 44.8 kg, SpO2 99%. Gen: no distress, normal appearing HEENT: oral mucosa pink and moist, NCAT Cardio: Reg rate Chest: normal effort, normal rate of breathing Abd: soft, non-distended Ext: no edema Psych: pleasant, normal affect Psychiatric: very flat Neurological: nonverbal- focused on staring- didn't really acknowledge I was there, nursing said at baseline- Skin:  + Scaling dried skin on head - stable, stage 2 to right toe, area of redness between left thumb and index finger-covered in dressing, left buttock unstageable  Neurologic: Does not respond to orientation questions, does not track, does not follow cues. Does not follow verbal commands  Tone: MAS 3 right elbow, B knee flexors, and right hip; MAS 3 L elbow , MS 3-4 in wrist and finger flexors  Motor 0/5 strength in BUE and BUE  Cerebellar exam unable to assess  Musculoskeletal: reduced bilateral shoulder and elbow ROM due to increased tone  RIght Elbow brace in place Strong neck flexion Right wrist swelling   Assessment/Plan: 1. Functional deficits which require 3+ hours per day of interdisciplinary therapy in a comprehensive inpatient rehab setting. Physiatrist is providing close team supervision and 24 hour management of active medical problems listed below. Physiatrist and rehab team continue to assess barriers to discharge/monitor patient progress toward functional  and medical goals  Care Tool:  Bathing    Body parts bathed by patient: Face, Chest, Abdomen   Body parts bathed by helper: Right arm, Left arm, Chest, Abdomen, Front perineal area, Buttocks, Right upper leg, Left upper leg, Right lower leg, Left lower leg, Face Body parts n/a: Right arm, Left arm, Front perineal area, Buttocks, Right upper leg, Left upper leg, Right lower leg, Left lower leg   Bathing assist Assist Level: Dependent - Patient 0%     Upper Body  Dressing/Undressing Upper body dressing   What is the patient wearing?: Pull over shirt    Upper body assist Assist Level: Dependent - Patient 0%    Lower Body Dressing/Undressing Lower body dressing      What is the patient wearing?: Incontinence brief, Pants     Lower body assist Assist for lower body dressing: Dependent - Patient 0%     Toileting Toileting Toileting Activity did not occur (Clothing management and hygiene only): N/A (no void or bm)  Toileting assist Assist for toileting: Dependent - Patient 0%     Transfers Chair/bed transfer  Transfers assist  Chair/bed transfer activity did not occur: Safety/medical concerns  Chair/bed transfer assist level: Maximal Assistance - Patient 25 - 49%     Locomotion Ambulation   Ambulation assist   Ambulation activity did not occur: Safety/medical concerns          Walk 10 feet activity   Assist  Walk 10 feet activity did not occur: Safety/medical concerns        Walk 50 feet activity   Assist Walk 50 feet with 2 turns activity did not occur: Safety/medical concerns         Walk 150 feet activity   Assist Walk 150 feet activity did not occur: Safety/medical concerns         Walk 10 feet on uneven surface  activity   Assist Walk 10 feet on uneven surfaces activity did not occur: Safety/medical concerns         Wheelchair     Assist Is the patient using a wheelchair?: Yes Type of Wheelchair: Manual (tilt in space)    Wheelchair assist level: Dependent - Patient 0%      Wheelchair 50 feet with 2 turns activity    Assist    Wheelchair 50 feet with 2 turns activity did not occur: Safety/medical concerns   Assist Level: Dependent - Patient 0%   Wheelchair 150 feet activity     Assist  Wheelchair 150 feet activity did not occur: Safety/medical concerns   Assist Level: Dependent - Patient 0%   Blood pressure 139/85, pulse 90, temperature 97.6 F (36.4 C),  temperature source Oral, resp. rate 18, height 5\' 1"  (1.549 m), weight 44.8 kg, SpO2 99%.  Medical Problem List and Plan: 1. 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  Impaired mobility: continue Lovenox, weekly creatinine ordered             -antiplatelet therapy: N/A 3. Pain Management: Tylenol as needed 4. Mood/Behavior/Sleep: Provide emotional support             -antipsychotic agents: N/A  5. Neuropsych/cognition: This patient is not capable of making decisions on her own behalf. 6. Left buttock unstageable ulcer.  Continue Medihoney to buttock wound daily cover with foam dressing.  Changing dressing every 3 days or as needed soiling   7. Fluids/Electrolytes/Nutrition: Routine in and outs with follow-up chemistries  -Labs 7/21 stable.  -Eating 100% most meals with total assist  Last labs 8/7- look good  8/11- eating well per nursing 8.  Cavitary right lower lobe pneumonia likely aspiration pneumonia/MRSA pneumonia.  Resolved    Latest Ref Rng & Units 12/20/2022   10:54 AM 12/11/2022    7:15 AM 12/03/2022    7:18 AM  CBC  WBC 4.0 - 10.5 K/uL 5.6  9.0   8.7   Hemoglobin 12.0 - 15.0 g/dL 16.1  09.6  04.5   Hematocrit 36.0 - 46.0 % 42.8  45.1  39.7   Platelets 150 - 400 K/uL 253  329  218      9.  History of drug abuse.  Positive cocaine on urine drug screen.  Provide counseling  10.  AKI/hypovolemia and ATN.  resolved    Latest Ref Rng & Units 12/22/2022   12:16 PM 12/20/2022   10:54 AM 12/15/2022    7:12 AM  BMP  Glucose 70 - 99 mg/dL  409    BUN 6 - 20 mg/dL  9    Creatinine 8.11 - 1.00 mg/dL 9.14  7.82  9.56   Sodium 135 - 145 mmol/L  137    Potassium 3.5 - 5.1 mmol/L  3.9    Chloride 98 - 111 mmol/L  101    CO2 22 - 32 mmol/L  22    Calcium 8.9 - 10.3 mg/dL  9.1    Recheck labs tomorrow ordered  11.  Mild transaminitis with rhabdomyolysis.  Both resolved  12.  Hypotension. Resolved, d/c midodrine  13. Impaired initiation: continue amantadine back to 100mg  BID  14. Suboptimal vitamin D: continue 1,000U D3 daily  15. Spasticity:   -increased baclofen to 20mg  TID and tizanidine to 4mg  BID. Continue wrist, elbow brace, and PRAFOs. Add tizanidine 2mg  HS  8/10- suggest low dose Dantrolene or in the long run, a Baclofen pump to be assessed in clinic? 16. Bowel and bladder incontinence: continue bowel and bladder program  -LBM 8/5   8/10- LBM x3 in last 12 hours- small- hard--will add Colace 100 mg daily  8/11- Medium BM today- incontinent- better/less hard than yesterday with Colace 17. Left hand skin maceration: Eucerin ordered, discussed hand split with OT, conitnue  18. Tachycardia: mild,~90bpm decreased amantadine back to 100mg  BID, continue this doseHR stable in 80s Q8/10- BP a little elvated- will con't to monitor for trend    12/28/2022    5:19 AM 12/27/2022    8:45 PM 12/27/2022    2:20 PM  Vitals with BMI  Systolic 139 138 213  Diastolic 85 85 87  Pulse 90 82  94    19. History of elevated SBP: Increased tizanidine to 4mg  BID  20. Area of redness between left thumb and index finger: continue foam dressing  21.  Scalp eczema: selsun blue ordered, continue  22. Fatigue: improved, discussed with team may be secondary to anti-spasticity medications, B12 reviewed and is 335, in suboptimal range, will give 1,000U B12 injection 8/7  23. Right wrist swelling: XR ordered and shows no acute fracture, shows 2.40mm ulnar variance, brace removed to given patient a break from it. Voltaren gel ordered prn for discomfort, continue  23. Dysphagia: continue D3 diet  LOS: 40 days A FACE TO FACE EVALUATION WAS PERFORMED   P  12/28/2022, 9:20 AM

## 2022-12-29 LAB — CREATININE, SERUM
Creatinine, Ser: 0.82 mg/dL (ref 0.44–1.00)
GFR, Estimated: >60 mL/min (ref >60)

## 2022-12-29 NOTE — Progress Notes (Signed)
PROGRESS NOTE   Subjective/Complaints: Mom filed for long term care and Medicaid and she was told it could take up to 90 days. Discussed that [facility] has a spot for her  ROS: Limited by cognition/behavioral  Objective:   No results found. No results for input(s): "WBC", "HGB", "HCT", "PLT" in the last 72 hours.   Recent Labs    12/29/22 0546  CREATININE 0.82    Intake/Output Summary (Last 24 hours) at 12/29/2022 1049 Last data filed at 12/28/2022 1810 Gross per 24 hour  Intake 476 ml  Output --  Net 476 ml      Pressure Injury 10/31/22 Buttocks Left Unstageable - Full thickness tissue loss in which the base of the injury is covered by slough (yellow, tan, gray, green or brown) and/or eschar (tan, brown or black) in the wound bed. (Active)  10/31/22 2000  Location: Buttocks  Location Orientation: Left  Staging: Unstageable - Full thickness tissue loss in which the base of the injury is covered by slough (yellow, tan, gray, green or brown) and/or eschar (tan, brown or black) in the wound bed.  Wound Description (Comments):   Present on Admission: Yes     Pressure Injury 11/18/22 Toe (Comment  which one) Anterior;Right Deep Tissue Pressure Injury - Purple or maroon localized area of discolored intact skin or blood-filled blister due to damage of underlying soft tissue from pressure and/or shear. small are (Active)  11/18/22 1300  Location: Toe (Comment  which one)  Location Orientation: Anterior;Right  Staging: Deep Tissue Pressure Injury - Purple or maroon localized area of discolored intact skin or blood-filled blister due to damage of underlying soft tissue from pressure and/or shear.  Wound Description (Comments): small area on right great toe metatarsal  Present on Admission: Yes    Physical Exam: Vital Signs Blood pressure 135/75, pulse 78, temperature 98.4 F (36.9 C), resp. rate 16, height 5\' 1"  (1.549 m),  weight 44.8 kg, SpO2 98%. Gen: no distress, normal appearing HEENT: oral mucosa pink and moist, NCAT Cardio: Reg rate Chest: normal effort, normal rate of breathing Abd: soft, non-distended Ext: no edema Psych: pleasant, normal affect Psychiatric: very flat Neurological: nonverbal- focused on staring- didn't really acknowledge I was there, nursing said at baseline- Skin:  + Scaling dried skin on head - stable, stage 2 to right toe, area of redness between left thumb and index finger-covered in dressing, left buttock unstageable  Neurologic: Does not respond to orientation questions, does not track, does not follow cues. Does not follow verbal commands  Tone: MAS 3 right elbow, B knee flexors, and right hip; MAS 3 L elbow , MS 3-4 in wrist and finger flexors  Motor 0/5 strength in BUE and BUE  Cerebellar exam unable to assess  Musculoskeletal: reduced bilateral shoulder and elbow ROM due to increased tone  RIght Elbow brace in place Strong neck flexion Right wrist swelling   Assessment/Plan: 1. Functional deficits which require 3+ hours per day of interdisciplinary therapy in a comprehensive inpatient rehab setting. Physiatrist is providing close team supervision and 24 hour management of active medical problems listed below. Physiatrist and rehab team continue to assess barriers to discharge/monitor patient progress  toward functional and medical goals  Care Tool:  Bathing    Body parts bathed by patient: Face, Chest, Abdomen   Body parts bathed by helper: Right arm, Left arm, Chest, Abdomen, Front perineal area, Buttocks, Right upper leg, Left upper leg, Right lower leg, Left lower leg, Face Body parts n/a: Right arm, Left arm, Front perineal area, Buttocks, Right upper leg, Left upper leg, Right lower leg, Left lower leg   Bathing assist Assist Level: Dependent - Patient 0%     Upper Body Dressing/Undressing Upper body dressing   What is the patient wearing?: Pull over  shirt    Upper body assist Assist Level: Dependent - Patient 0%    Lower Body Dressing/Undressing Lower body dressing      What is the patient wearing?: Incontinence brief, Pants     Lower body assist Assist for lower body dressing: Dependent - Patient 0%     Toileting Toileting Toileting Activity did not occur (Clothing management and hygiene only): N/A (no void or bm)  Toileting assist Assist for toileting: Dependent - Patient 0%     Transfers Chair/bed transfer  Transfers assist  Chair/bed transfer activity did not occur: Safety/medical concerns  Chair/bed transfer assist level: Dependent - Patient 0%     Locomotion Ambulation   Ambulation assist   Ambulation activity did not occur: Safety/medical concerns          Walk 10 feet activity   Assist  Walk 10 feet activity did not occur: Safety/medical concerns        Walk 50 feet activity   Assist Walk 50 feet with 2 turns activity did not occur: Safety/medical concerns         Walk 150 feet activity   Assist Walk 150 feet activity did not occur: Safety/medical concerns         Walk 10 feet on uneven surface  activity   Assist Walk 10 feet on uneven surfaces activity did not occur: Safety/medical concerns         Wheelchair     Assist Is the patient using a wheelchair?: Yes Type of Wheelchair: Manual (tilt in space)    Wheelchair assist level: Dependent - Patient 0%      Wheelchair 50 feet with 2 turns activity    Assist    Wheelchair 50 feet with 2 turns activity did not occur: Safety/medical concerns   Assist Level: Dependent - Patient 0%   Wheelchair 150 feet activity     Assist  Wheelchair 150 feet activity did not occur: Safety/medical concerns   Assist Level: Dependent - Patient 0%   Blood pressure 135/75, pulse 78, temperature 98.4 F (36.9 C), resp. rate 16, height 5\' 1"  (1.549 m), weight 44.8 kg, SpO2 98%.  Medical Problem List and Plan: 1.  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  Impaired mobility: continue Lovenox, weekly creatinine ordered             -antiplatelet therapy: N/A 3. Pain Management: Tylenol as needed  4. History of anxiety: discussed with her mom that she feels this was a big risk factor for her accident  5. Neuropsych/cognition: This patient is not capable of making decisions on her own behalf. 6. Left buttock unstageable ulcer.  Continue Medihoney to buttock wound daily cover with foam dressing.  Changing dressing every 3 days or as needed soiling   7. Fluids/Electrolytes/Nutrition: Routine in and outs with follow-up chemistries  -Labs 7/21 stable.  -Eating 100% most meals with total assist  Last labs 8/7- look good  8/11- eating well per nursing 8.  Cavitary right lower lobe pneumonia likely aspiration pneumonia/MRSA pneumonia.  Resolved    Latest Ref Rng & Units 12/20/2022   10:54 AM 12/11/2022    7:15 AM 12/03/2022    7:18 AM  CBC  WBC 4.0 - 10.5 K/uL 5.6  9.0  8.7   Hemoglobin 12.0 - 15.0 g/dL 06.2  37.6  28.3   Hematocrit 36.0 - 46.0 % 42.8  45.1   39.7   Platelets 150 - 400 K/uL 253  329  218      9.  History of drug abuse.  Positive cocaine on urine drug screen.  Provide counseling  10.  AKI/hypovolemia and ATN.  resolved    Latest Ref Rng & Units 12/29/2022    5:46 AM 12/22/2022   12:16 PM 12/20/2022   10:54 AM  BMP  Glucose 70 - 99 mg/dL   151   BUN 6 - 20 mg/dL   9   Creatinine 7.61 - 1.00 mg/dL 6.07  3.71  0.62   Sodium 135 - 145 mmol/L   137   Potassium 3.5 - 5.1 mmol/L   3.9   Chloride 98 - 111 mmol/L   101   CO2 22 - 32 mmol/L   22   Calcium 8.9 - 10.3 mg/dL   9.1   Recheck labs tomorrow ordered  11.  Mild transaminitis with rhabdomyolysis.  Both resolved  12.  Hypotension. Resolved, d/c midodrine  13. Impaired initiation: continue amantadine back to 100mg  BID  14. Suboptimal vitamin D: continue 1,000U D3 daily  15. Spasticity:   -discussed serial casting with OT and her mom  -increased baclofen to 20mg  TID and tizanidine to 4mg  BID. Continue wrist, elbow brace, and PRAFOs. Add tizanidine 2mg  HS  8/10- suggest low dose Dantrolene or in the long run, a Baclofen pump to be assessed in clinic? 16. Bowel and bladder incontinence: continue bowel and bladder program  -LBM 8/5   8/10- LBM x3 in last 12 hours- small- hard--will add Colace 100 mg daily  8/11- Medium BM today- incontinent- better/less hard than yesterday with Colace  17. Left hand skin maceration: Eucerin ordered, discussed hand split with OT, conitnue  18. Tachycardia: mild,~90bpm decreased amantadine back to 100mg  BID, continue this doseHR stable in 80s Q8/10- BP a little elvated- will con't to monitor for trend    12/29/2022    6:18 AM 12/28/2022    9:04 PM 12/28/2022    1:21 PM  Vitals with BMI  Systolic 135 122 694  Diastolic 75 85 83  Pulse 78 86 91    19. History of elevated SBP: Increased tizanidine to 4mg  BID, discussed that blood pressure is controlled  20. Area of redness between left thumb and index finger: continue foam  dressing  21. Scalp eczema: selsun blue ordered, continue  22. Fatigue: improved, discussed with team may be secondary to anti-spasticity medications, B12 reviewed and is 335, in suboptimal range, will give 1,000U B12 injection 8/7  23. Right wrist swelling: XR ordered and shows no acute fracture, shows 2.27mm ulnar variance, brace removed to given patient a break from it. Voltaren gel ordered prn for discomfort, continue  23. Dysphagia: continue D3 diet  I spent >17mins performing patient care related activities, including face to face time, documentation time, med management, discussion of meds and lab orders with patient, and overall coordination of care, updating mother  LOS: 41 days A FACE TO FACE EVALUATION WAS PERFORMED  Clint Bolder P Kenyona Rena 12/29/2022, 10:49 AM

## 2022-12-29 NOTE — Progress Notes (Signed)
Occupational Therapy Session Note  Patient Details  Name: Nicole [name] MRN: [id number] Date of Birth: Nov 02, 1975  Today's Date: 12/29/2022 OT Individual Time: 0900-1015 OT Individual Time Calculation (min): 75 min    Short Term Goals: Week 6:  OT Short Term Goal 1 (Week 6): Patient will sit unsupported at edge of bed x 15 seconds in preparation for level surface transfer OT Short Term Goal 2 (Week 6): Patient will demonstrate improved focused attention as evidenced by sustaining eye contact x at least 20 seconds while in supported position. OT Short Term Goal 3 (Week 6): Patient will demonstrate 10* of elbow extension in BUE to reduce caregiver burden with bathing and dressing care  Skilled Therapeutic Interventions/Progress Updates:    Patient received sitting up in bed with NT offering her a drink.  Often lately patient noted to chew on straw versus drink.  Patient assisted to wheelchair - continue to work on decreasing level of assistance needed for rolling.  Bathed and dressed patient and transported to gym.  Patient transferred to mat table where sitting balance (static) and trunk/ UE mobility addressed.  Patient with arms supported on tray table, and worked to improve lateral trunk flexion left with corresponding cervical lat flexion.  Worked on sidebending to accept weight on forearm on either side, again with emphasis on trunk shortening/ elongation.  Patient able to maintain static sitting x 25 seconds before anterior loss of balance.  Patient without protective response although loss of balance occurred very slowly due to muscle tension.  Patient able to maintain arms on armrests of wheelchair at end of session so UE braces left off.  Patient left up in wheelchair on room with safety belt in place and engaged and needed personal items nearby.    Therapy Documentation Precautions:  Precautions Precautions: Fall Precaution Comments: Contact precautions & delayed  processing Restrictions Weight Bearing Restrictions: No   Pain: Pain Assessment Pain Scale: Faces Pain Score: 0-No pain     Therapy/Group: Individual Therapy  [name] 12/29/2022, 12:28 PM

## 2022-12-29 NOTE — Progress Notes (Signed)
Physical Therapy Session Note  Patient Details  Name: Nicole [name] MRN: [id number] Date of Birth: [birth year]  Today's Date: 12/29/2022 PT Individual Time: 1117-1200 PT Individual Time Calculation (min): 43 min   Short Term Goals: Week 5:  PT Short Term Goal 1 (Week 5): Pt will initiate bed mobility requiring no more than totalA. PT Short Term Goal 2 (Week 5): Pt will maintain sitting balance unsupported for up to 30 seconds PT Short Term Goal 3 (Week 5): Pt will demonstrate command follow for >10% of tasks. PT Short Term Goal 4 (Week 5): [name] RLE will demonstrate improved joint mobility into knee/ hip extension and ankle DF to neutral.  Skilled Therapeutic Interventions/Progress Updates: Pt presents reclined back in TIS, mother present, agreeable to therapy.  Pt wheeled to small gym for space.  Pt transfers sit to stand/SPT to mat table w/ dependent A.  Pt tolerated stretches to pec region w/ T-ball on back in sitting as well as c-spine stretch into extension and L rotation.  Pt does maintain up right head x 5 seconds.  Pt returned to room for brief change.  Pt transferred w/c <> bed w/ dependent transfer.  Pt rolls side to side for brief, pants change and pericare w/ total A.  Pt transferred back into TIS and remained sitting w/ mother present, chair alarm on and reclined back.     Therapy Documentation Precautions:  Precautions Precautions: Fall Precaution Comments: Contact precautions & delayed processing Restrictions Weight Bearing Restrictions: No General:   Vital Signs:   Pain:grimaces w/ stretching. Pain Assessment Pain Scale: Faces Pain Score: 0-No pain    Therapy/Group: Individual Therapy  [name] 12/29/2022, 12:08 PM

## 2022-12-29 NOTE — Progress Notes (Signed)
Physical Therapy Session Note  Patient Details  Name: Nicole [name] MRN: [id number] Date of Birth: August 14, 1975  Today's Date: 12/29/2022 PT Individual Time: 0093-8182 PT Individual Time Calculation (min): 82 min   Short Term Goals: Week 4:  PT Short Term Goal 1 (Week 4): Pt will initiate bed mobility but require no more than totalA. PT Short Term Goal 1 - Progress (Week 4): Not met PT Short Term Goal 2 (Week 4): Pt will maintain sitting balance unsupported for 30 seconds PT Short Term Goal 2 - Progress (Week 4): Progressing toward goal PT Short Term Goal 3 (Week 4): Pt will demonstrate command follow for ~15% of tasks. PT Short Term Goal 3 - Progress (Week 4): Not met Week 5:  PT Short Term Goal 1 (Week 5): Pt will initiate bed mobility requiring no more than totalA. PT Short Term Goal 2 (Week 5): Pt will maintain sitting balance unsupported for up to 30 seconds PT Short Term Goal 3 (Week 5): Pt will demonstrate command follow for >10% of tasks. PT Short Term Goal 4 (Week 5): [name] RLE will demonstrate improved joint mobility into knee/ hip extension and ankle DF to neutral.   Skilled Therapeutic Interventions/Progress Updates:  Patient received seated upright in w/c on entrance to room. Pt in day room with rehab tech on start of session.   Patient with no indication of pain complaint at start of session.  Pt transferred via squat pivot dependently to mat table. In unsupported sitting, pt is able to maintain seated position for ~10sec but only if squared perfectly prior to attempt for controlled seated balance.  Pt demos increased tone with slowed movements into LOB but no reflexive movements from arms. Progressed into neck and trunk rotations in seated position to increase overall mobility.   Therapeutic Activity: Bed Mobility: Pt performed supine <> sit with ***. VC/ tc required for ***. Transfers: Pt performed sit<>stand and stand pivot transfers throughout session with ***. Provided  verbal cues for***.  Gait Training:  Pt ambulated *** ft using *** with ***. Demonstrated ***. Provided vc/ tc for ***.  Wheelchair Mobility:  Pt propelled wheelchair *** feet with ***. Provided vc/ tc for ***.  Neuromuscular Re-ed: NMR facilitated during session with focus on***. Pt guided in ***. NMR performed for improvements in motor control and coordination, balance, sequencing, judgement, and self confidence/ efficacy in performing all aspects of mobility at highest level of independence.   Patient *** at end of session with brakes locked, *** alarm set, and all needs within reach.  ~~~~~~~~~~~~~ Pt brought to day room and transferred via squat pivot dependently to mat table. Attempts for reflexive arm movements with moves into controlled LOB forward and to sides. Pt demos increased tone with slowed movements into LOB but no reflexive movements from arms. Progressed into neck and trunk rotations in seated position to increase overall mobility.    T-ball used to bring shoulders into scap retraction and to stretch pecs. Slow attempt to bring occiput to ball but increased tone into flexion prevents full reach posteriorly.    Pt guided into supine on mat table into hooklying positioning for trunk rotations with knees to each side and for propped GH mobility into lateral abduction into max ranges.    Patient with indication of mild pain complaint into max ranges initially and increased comfort with time into position.     Attempt to engage pt throughout session with conversation. No head nods this session. Increased eye movements with darting  from side to side.    Attempt to engage pt in head control/ eye mvmt toward conscious acknowledgement of items in room around her. No changes in position/ awareness noted throughout.    Pt positioned supine in bed and NT notified as to UI and need for brief change. Pt requires DEP assist to participate in bed rolling  but provided with vc throughout  prior to transition as to required movements for pt to eventually participate with volitional movements. Asleep at end of session and positioned in extensor positioning.   Therapy Documentation Precautions:  Precautions Precautions: Fall Precaution Comments: Contact precautions & delayed processing Restrictions Weight Bearing Restrictions: No  Pain:    Therapy/Group: Individual Therapy  Loel Dubonnet PT, DPT, CSRS 12/29/2022, 4:49 PM

## 2022-12-30 DIAGNOSIS — G931 Anoxic brain damage, not elsewhere classified: Principal | ICD-10-CM

## 2022-12-30 NOTE — Progress Notes (Signed)
PROGRESS NOTE   Subjective/Complaints:  Non verbal , eating well with staff feeding her.  ROS: Limited by cognition/behavioral  Objective:   No results found. No results for input(s): "WBC", "HGB", "HCT", "PLT" in the last 72 hours.   Recent Labs    12/29/22 0546  CREATININE 0.82    Intake/Output Summary (Last 24 hours) at 12/30/2022 1231 Last data filed at 12/30/2022 0700 Gross per 24 hour  Intake 1055 ml  Output --  Net 1055 ml      Pressure Injury 10/31/22 Buttocks Left Unstageable - Full thickness tissue loss in which the base of the injury is covered by slough (yellow, tan, gray, green or brown) and/or eschar (tan, brown or black) in the wound bed. (Active)  10/31/22 2000  Location: Buttocks  Location Orientation: Left  Staging: Unstageable - Full thickness tissue loss in which the base of the injury is covered by slough (yellow, tan, gray, green or brown) and/or eschar (tan, brown or black) in the wound bed.  Wound Description (Comments):   Present on Admission: Yes     Pressure Injury 11/18/22 Toe (Comment  which one) Anterior;Right Deep Tissue Pressure Injury - Purple or maroon localized area of discolored intact skin or blood-filled blister due to damage of underlying soft tissue from pressure and/or shear. small are (Active)  11/18/22 1300  Location: Toe (Comment  which one)  Location Orientation: Anterior;Right  Staging: Deep Tissue Pressure Injury - Purple or maroon localized area of discolored intact skin or blood-filled blister due to damage of underlying soft tissue from pressure and/or shear.  Wound Description (Comments): small area on right great toe metatarsal  Present on Admission: Yes    Physical Exam: Vital Signs Blood pressure (!) 129/91, pulse 89, temperature 99.3 F (37.4 C), resp. rate 16, height 5\' 1"  (1.549 m), weight 44.8 kg, SpO2 100%.  General: No acute distress Mood and affect  are appropriate Heart: Regular rate and rhythm no rubs murmurs or extra sounds Lungs: Clear to auscultation, breathing unlabored, no rales or wheezes Abdomen: Positive bowel sounds, soft nontender to palpation, nondistended Extremities: No clubbing, cyanosis, or edema Skin: No evidence of breakdown, no evidence of rash   Skin:  + Scaling dried skin on head - stable, stage 2 to right toe, area of redness between left thumb and index finger-covered in dressing, left buttock unstageable  Neurologic: Does not respond to orientation questions, does not track, does not follow cues. Does not follow verbal commands  Tone: MAS 3 right elbow, B knee flexors, and right hip; MAS 3 L elbow , MS 3-4 in wrist and finger flexors  Motor 0/5 strength in LUE and BLE, some purposeful movement in RUE scratches nose but cannot follow commands to cooperate with MMT   Cerebellar exam unable to assess  Sensation withdraws to pinch BLE and Right index finger Musculoskeletal: reduced bilateral shoulder and elbow ROM due to increased tone  RIght Elbow brace in place Strong neck flexion Right wrist swelling   Assessment/Plan: 1. Functional deficits which require 3+ hours per day of interdisciplinary therapy in a comprehensive inpatient rehab setting. Physiatrist is providing close team supervision and 24 hour management of active  medical problems listed below. Physiatrist and rehab team continue to assess barriers to discharge/monitor patient progress toward functional and medical goals  Care Tool:  Bathing    Body parts bathed by patient: Face, Chest, Abdomen   Body parts bathed by helper: Right arm, Left arm, Chest, Abdomen, Front perineal area, Buttocks, Right upper leg, Left upper leg, Right lower leg, Left lower leg, Face Body parts n/a: Right arm, Left arm, Front perineal area, Buttocks, Right upper leg, Left upper leg, Right lower leg, Left lower leg   Bathing assist Assist Level: Dependent - Patient  0%     Upper Body Dressing/Undressing Upper body dressing   What is the patient wearing?: Pull over shirt    Upper body assist Assist Level: Dependent - Patient 0%    Lower Body Dressing/Undressing Lower body dressing      What is the patient wearing?: Incontinence brief, Pants     Lower body assist Assist for lower body dressing: Dependent - Patient 0%     Toileting Toileting Toileting Activity did not occur (Clothing management and hygiene only): N/A (no void or bm)  Toileting assist Assist for toileting: Dependent - Patient 0%     Transfers Chair/bed transfer  Transfers assist  Chair/bed transfer activity did not occur: Safety/medical concerns  Chair/bed transfer assist level: Dependent - Patient 0%     Locomotion Ambulation   Ambulation assist   Ambulation activity did not occur: Safety/medical concerns          Walk 10 feet activity   Assist  Walk 10 feet activity did not occur: Safety/medical concerns        Walk 50 feet activity   Assist Walk 50 feet with 2 turns activity did not occur: Safety/medical concerns         Walk 150 feet activity   Assist Walk 150 feet activity did not occur: Safety/medical concerns         Walk 10 feet on uneven surface  activity   Assist Walk 10 feet on uneven surfaces activity did not occur: Safety/medical concerns         Wheelchair     Assist Is the patient using a wheelchair?: Yes Type of Wheelchair: Manual (tilt in space)    Wheelchair assist level: Dependent - Patient 0%      Wheelchair 50 feet with 2 turns activity    Assist    Wheelchair 50 feet with 2 turns activity did not occur: Safety/medical concerns   Assist Level: Dependent - Patient 0%   Wheelchair 150 feet activity     Assist  Wheelchair 150 feet activity did not occur: Safety/medical concerns   Assist Level: Dependent - Patient 0%   Blood pressure (!) 129/91, pulse 89, temperature 99.3 F (37.4  C), resp. rate 16, height 5\' 1"  (1.549 m), weight 44.8 kg, SpO2 100%.  Medical Problem List and Plan: 1. 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  Impaired mobility: continue Lovenox, weekly creatinine ordered             -antiplatelet therapy: N/A 3. Pain Management: Tylenol as needed  4. History of anxiety: discussed with her mom that she feels this was a big risk factor for her accident  5. Neuropsych/cognition: This patient is not capable of making decisions on her own behalf. 6. Left buttock unstageable ulcer.  Continue Medihoney to buttock wound daily cover with foam dressing.  Changing dressing every 3 days or as needed soiling   7. Fluids/Electrolytes/Nutrition: Routine in and outs with follow-up chemistries  -Labs 7/21 stable.  -Eating 100% most meals with total assist  Last labs 8/7- look good  8/11- eating well per nursing 8.  Cavitary right lower lobe pneumonia likely aspiration pneumonia/MRSA pneumonia.  Resolved    Latest Ref Rng & Units 12/20/2022   10:54 AM 12/11/2022    7:15 AM 12/03/2022    7:18 AM  CBC  WBC 4.0 - 10.5 K/uL 5.6  9.0  8.7   Hemoglobin  12.0 - 15.0 g/dL 16.1  09.6  04.5   Hematocrit 36.0 - 46.0 % 42.8  45.1  39.7   Platelets 150 - 400 K/uL 253  329  218      9.  History of drug abuse.  Positive cocaine on urine drug screen.  Provide counseling  10.  AKI/hypovolemia and ATN.  resolved    Latest Ref Rng & Units 12/29/2022    5:46 AM 12/22/2022   12:16 PM 12/20/2022   10:54 AM  BMP  Glucose 70 - 99 mg/dL   409   BUN 6 - 20 mg/dL   9   Creatinine 8.11 - 1.00 mg/dL 9.14  7.82  9.56   Sodium 135 - 145 mmol/L   137   Potassium 3.5 - 5.1 mmol/L   3.9   Chloride 98 - 111 mmol/L   101   CO2 22 - 32 mmol/L   22   Calcium 8.9 - 10.3 mg/dL   9.1   Recheck labs tomorrow ordered  11.  Mild transaminitis with rhabdomyolysis.  Both resolved  12.  Hypotension. Resolved, d/c midodrine  13. Impaired initiation: continue amantadine back to 100mg  BID  14. Suboptimal vitamin D: continue 1,000U D3 daily  15. Spasticity:   -discussed serial casting with OT and her mom  -increased baclofen to 20mg  TID and tizanidine to 4mg  BID. Continue wrist, elbow brace, and PRAFOs. Add tizanidine 2mg  HS  8/10- suggest low dose Dantrolene or in the long run, a Baclofen pump to be assessed in clinic? 16. Bowel and bladder incontinence: continue bowel and bladder program  -LBM 8/5   8/10- LBM x3 in last 12 hours- small- hard--will add Colace 100 mg daily  8/11- Medium BM today- incontinent- better/less hard than yesterday with Colace  17. Left hand skin maceration: Eucerin ordered, discussed hand split with OT, conitnue  18. Tachycardia: mild,~90bpm decreased amantadine back to 100mg  BID, continue this doseHR stable in 80s Q8/10- BP a little elvated- will con't to monitor for trend    12/30/2022    7:50 AM 12/30/2022    5:58 AM 12/29/2022  7:28 PM  Vitals with BMI  Systolic 129 132 409  Diastolic 91 94 93  Pulse 89 88 91    19. History of elevated SBP: Increased tizanidine to 4mg  BID, discussed that blood pressure is controlled  20. Area  of redness between left thumb and index finger: continue foam dressing  21. Scalp eczema: selsun blue ordered, continue  22. Fatigue: improved, discussed with team may be secondary to anti-spasticity medications, B12 reviewed and is 335, in suboptimal range, will give 1,000U B12 injection 8/7  23. Right wrist swelling: XR ordered and shows no acute fracture, shows 2.15mm ulnar variance, brace removed to given patient a break from it. Voltaren gel ordered prn for discomfort, continue  23. Dysphagia: continue D3 diet    LOS: 42 days A FACE TO FACE EVALUATION WAS PERFORMED  Erick Colace 12/30/2022, 12:31 PM

## 2022-12-31 NOTE — Progress Notes (Signed)
Speech Language Pathology Daily Session Note  Patient Details  Name: Nicole [name] MRN: [id number] Date of Birth: 1976/03/25  Today's Date: 12/31/2022 SLP Individual Time: 4098-1191 SLP Individual Time Calculation (min): 30 min  Short Term Goals: Week 6: SLP Short Term Goal 1 (Week 6): Pt will present w/ appropriate response to functional stimuli (i.e. accept spoon, grasp basic item appropriately, response to tactile/auditory stimuli) 15% of the time w/ maxA multimodal cues SLP Short Term Goal 2 (Week 6): Patient will follow 1-step commands within a context in 5% of opportunities with Max A multimodal cues. SLP Short Term Goal 3 (Week 6): Patient will utilize multimodal communication to answer basic yes/no questions with maxA in 5% of opportunities  Skilled Therapeutic Interventions:  Pt was seen in PM to address cognitive re- training and language. Pt was alert upon SLP arrival. She was seated upright in TIS WC. Pt responsive to verbal stimuli c/b moving her head toward door upon SLP arrival. She smiled in response to greeting by SLP. SLP presenting varying colored buttons and challenging pt in intentional use of object. Pt consistently warranting total A for pressing button despite model, tactile stimulation, and verbal instruction. SLP further addressing answering basic yes/no questions given opposing yes/ no colored signs placed side by side at far sides of table. Focus placed on "yes" with clinician presenting questions verbally. Pt required hand over hand assist in all attempts to touch sign along with clinician challenging pt to turn her head to each side in order to determine a gaze preference with limited return. Pt was left seated upright in WC with chair alarm active. SLP to continue POC.   Pain Pain Assessment Pain Scale: Faces Faces Pain Scale: No hurt  Therapy/Group: Individual Therapy  [name] 12/31/2022, 3:35 PM

## 2022-12-31 NOTE — Progress Notes (Signed)
Occupational Therapy Session Note  Patient Details  Name: Nicole [name] MRN: [id number] Date of Birth: 06-09-1975  Today's Date: 12/31/2022 OT Individual Time: 0915-1000 OT Individual Time Calculation (min): 45 min    Short Term Goals: Week 1:  OT Short Term Goal 1 (Week 1): Pt will initiate simple ADL task with Max multimodal cuing. OT Short Term Goal 1 - Progress (Week 1): Not met OT Short Term Goal 2 (Week 1): Pt will attend to ADL task for >2 min in distraction-free environment. OT Short Term Goal 2 - Progress (Week 1): Not met OT Short Term Goal 3 (Week 1): Pt will express preference between two options in preparation for self-advocacy during medical care. OT Short Term Goal 3 - Progress (Week 1): Not met Week 2:  OT Short Term Goal 1 (Week 2): Patient will visually attend to self in mirror x 3-5 seconds with max cueing as prerequisite to grooming tasks OT Short Term Goal 1 - Progress (Week 2): Progressing toward goal OT Short Term Goal 2 (Week 2): Patient will participate following hand over hand cueing in familiar functional task - e.g. brushing hair, brushing teeth, washing body with RUE and mod assist OT Short Term Goal 2 - Progress (Week 2): Met OT Short Term Goal 3 (Week 2): Patient will sit unsupported x 5 seconds in preparation for level surface transfer OT Short Term Goal 3 - Progress (Week 2): Progressing toward goal OT Short Term Goal 4 (Week 2): Patient will demonstrate localized response (looking toward)to auditory stimulus (name being called) 3 of 5 trials from supported position. OT Short Term Goal 4 - Progress (Week 2): Progressing toward goal OT Short Term Goal 5 (Week 2): Patient will demonstrate improved passive or active assisted elbow range of motion in preparation for assistance with BADL.  Right -5* extension, Left - 15* extension OT Short Term Goal 5 - Progress (Week 2): Progressing toward goal Week 3:  OT Short Term Goal 1 (Week 3): pt will tolerate tilt table to  at least 60* for 3 consecutive sessions as precursor to higher level functional mobility tasks OT Short Term Goal 1 - Progress (Week 3): Not met OT Short Term Goal 2 (Week 3): pt will respond to verbal stimuli 3/5 trials to improve attention to task OT Short Term Goal 2 - Progress (Week 3): Met OT Short Term Goal 3 (Week 3): pt will maintain static sitting balance EOB with CGA for at least 10 secs as precursor to higher level ADl tasks OT Short Term Goal 3 - Progress (Week 3): Met Week 4:  OT Short Term Goal 1 (Week 4): Patient will sit unsupported at edge of bed x 15 seconds in preparation for level surface transfer OT Short Term Goal 1 - Progress (Week 4): Progressing toward goal OT Short Term Goal 2 (Week 4): Patient will transition from sit to stand and stand to sit as needed for lower body dressing with max assist OT Short Term Goal 2 - Progress (Week 4): Met OT Short Term Goal 3 (Week 4): Patient will turn head/ eyes toward auditory stimulation either right or left 25%/ time to aide in focused attention OT Short Term Goal 3 - Progress (Week 4): Met OT Short Term Goal 4 (Week 4): Patient will participate following hand over hand assistance in at least one familiar functional task x 5-10 seconds. OT Short Term Goal 4 - Progress (Week 4): Met Week 5:  OT Short Term Goal 1 (Week 5): Patient will  sit unsupported at edge of bed x 15 seconds in preparation for level surface transfer OT Short Term Goal 1 - Progress (Week 5): Progressing toward goal OT Short Term Goal 2 (Week 5): Patient will demonstrate improved focused attention as evidenced by sustaining eye contact x at least 10 seconds while in supported position. OT Short Term Goal 2 - Progress (Week 5): Met OT Short Term Goal 3 (Week 5): Patient will demonstrate improved passive/active range of motion in right fingers to allow improved functional use of RUE for participation in BADL OT Short Term Goal 3 - Progress (Week 5): Met  Skilled  Therapeutic Interventions/Progress Updates:    1:1 Pt received in the bed resting. NT reported she donned the mit on her right hand cause she was scratching her head so bad. Pt allowed for stretching in the bed of LEs and rolling side to side with trunk rotation. Pt given a choice of two shirts to pick to don. Pt did initiated threading her right arm and following through with pulling shirt down over her head. Pt donned pants with total A with rolling. [name] hair washed in supine with HOB elevated with total A. Total A transfer into the TIS w/c. Pt did participate and initiate brushing her hair. Pt did require A to get brush high enough on her head but then would brush and return the brush to her scalp. Pt left positioned tilted in chair with full lap tray and soft neck brace for support for her head and neck in chair. Left her listening to music on the computer.   Throughout session pt would find my face about 50% of the time when addressed her by name.   Therapy Documentation Precautions:  Precautions Precautions: Fall Precaution Comments: Contact precautions & delayed processing Restrictions Weight Bearing Restrictions: No General:   Vital Signs:   Pain:  Some grimaces with changing position and stretching. Allowed for breaks and repositioning    Therapy/Group: Individual Therapy  Roney Mans [facility] 12/31/2022, 10:29 AM

## 2023-01-01 NOTE — Progress Notes (Addendum)
Physical Therapy Session Note  Patient Details  Name: Nicole [name] MRN: [id number] Date of Birth: [birth year]  Today's Date: 01/01/2023 PT Individual Time:  0921-1003  PT Individual Time Calculation (min): 42 min  Short Term Goals: Week 1:  PT Short Term Goal 1 (Week 1): pt will perform sit<>stand with +2 max A PT Short Term Goal 1 - Progress (Week 1): Not met PT Short Term Goal 2 (Week 1): pt will initiate gait training PT Short Term Goal 2 - Progress (Week 1): Not met PT Short Term Goal 3 (Week 1): Pt will perform bed<>chair transfer with +2 max A PT Short Term Goal 3 - Progress (Week 1): Not met Week 5:  PT Short Term Goal 1 (Week 5): Pt will initiate bed mobility requiring no more than totalA. PT Short Term Goal 2 (Week 5): Pt will maintain sitting balance unsupported for up to 30 seconds PT Short Term Goal 3 (Week 5): Pt will demonstrate command follow for >10% of tasks. PT Short Term Goal 4 (Week 5): [name] RLE will demonstrate improved joint mobility into knee/ hip extension and ankle DF to neutral.   Skilled Therapeutic Interventions/Progress Updates:  Patient supine in bed on entrance to room.  Patient awake/ alert and demos brief turn of eyes to L with additional head turn upon verbal recognition of pt from therapist at door. During session, pt with brief instances of eye turn to verbal stimulus from this therapist but does not hold gaze.   Patient with no indication of pain complaint at start of session. Hair brushed out of face in order to decrease irritation and bodily reaction to irritation.   Pt presents in bed in grossly flexed position and requires ranging of BUE and BLE to improve tone and maintain mobility for eventual future volitional mobility. Focus on UE to Shoulder lateral abduction, ER/ IR, flex/ ext, and elbow flex ext. Focus on BLE to hip abd/ add, IR/ ER in hooklying, and knee flex/ ext with vc for push into full extension. Pt also moved in passive PNF D1 and D2  mobility into end ranges with overpressure in attempt to invoke reflexive return.   Patient with indication of mild pain complaint into max ranges initially and increased comfort with time into position.    Patient positioned in BLE extension with PRAFOs donned, elbow brace donned to LUE and palm guard with rolled washcloth to L hand for increase of resting finger extension at end of session. Bed brakes locked, NT notified to positioning for pt throughout day and pt left with all needs within reach.   Therapy Documentation Precautions:  Precautions Precautions: Fall Precaution Comments: Contact precautions & delayed processing Restrictions Weight Bearing Restrictions: No General:   Vital Signs: Therapy Vitals Temp: 98 F (36.7 C) Temp Source: Axillary Pain: Pain indicated during session with facial grimace that improves quickly with either time in position or decreasing position from max range.   Therapy/Group: Individual Therapy  Loel Dubonnet PT, DPT, CSRS 12/30/2022, 2:31 PM

## 2023-01-01 NOTE — Progress Notes (Signed)
PROGRESS NOTE   Subjective/Complaints:  Patient's chart reviewed- No issues reported overnight Vitals signs stable  Appears comfortable in room  ROS: Limited by cognition/behavioral  Objective:   No results found. No results for input(s): "WBC", "HGB", "HCT", "PLT" in the last 72 hours.   No results for input(s): "NA", "K", "CL", "CO2", "GLUCOSE", "BUN", "CREATININE", "CALCIUM" in the last 72 hours.   Intake/Output Summary (Last 24 hours) at 01/01/2023 0942 Last data filed at 12/31/2022 1801 Gross per 24 hour  Intake 476 ml  Output --  Net 476 ml      Pressure Injury 10/31/22 Buttocks Left Unstageable - Full thickness tissue loss in which the base of the injury is covered by slough (yellow, tan, gray, green or brown) and/or eschar (tan, brown or black) in the wound bed. (Active)  10/31/22 2000  Location: Buttocks  Location Orientation: Left  Staging: Unstageable - Full thickness tissue loss in which the base of the injury is covered by slough (yellow, tan, gray, green or brown) and/or eschar (tan, brown or black) in the wound bed.  Wound Description (Comments):   Present on Admission: Yes     Pressure Injury 11/18/22 Toe (Comment  which one) Anterior;Right Deep Tissue Pressure Injury - Purple or maroon localized area of discolored intact skin or blood-filled blister due to damage of underlying soft tissue from pressure and/or shear. small are (Active)  11/18/22 1300  Location: Toe (Comment  which one)  Location Orientation: Anterior;Right  Staging: Deep Tissue Pressure Injury - Purple or maroon localized area of discolored intact skin or blood-filled blister due to damage of underlying soft tissue from pressure and/or shear.  Wound Description (Comments): small area on right great toe metatarsal  Present on Admission: Yes    Physical Exam: Vital Signs Blood pressure 116/73, pulse 78, temperature 98 F (36.7 C),  resp. rate 17, height 5\' 1"  (1.549 m), weight 44.8 kg, SpO2 100%.  Gen: no distress, normal appearing HEENT: oral mucosa pink and moist, NCAT Cardio: Reg rate Chest: normal effort, normal rate of breathing Abd: soft, non-distended Ext: no edema Psych: pleasant, normal affect  Skin:  + Scaling dried skin on head - stable, stage 2 to right toe, area of redness between left thumb and index finger-covered in dressing, left buttock unstageable  Neurologic: Does not respond to orientation questions, does not track, does not follow cues. Does not follow verbal commands  Tone: MAS 3 right elbow, B knee flexors, and right hip; MAS 3 L elbow , MS 3-4 in wrist and finger flexors  Motor 0/5 strength in LUE and BLE, some purposeful movement in RUE scratches nose but cannot follow commands to cooperate with MMT   Cerebellar exam unable to assess  Sensation withdraws to pinch BLE and Right index finger Musculoskeletal: reduced bilateral shoulder and elbow ROM due to increased tone  RIght Elbow brace in place Strong neck flexion Right wrist swelling   Assessment/Plan: 1. Functional deficits which require 3+ hours per day of interdisciplinary therapy in a comprehensive inpatient rehab setting. Physiatrist is providing close team supervision and 24 hour management of active medical problems listed below. Physiatrist and rehab team continue to assess barriers  to discharge/monitor patient progress toward functional and medical goals  Care Tool:  Bathing    Body parts bathed by patient: Face, Chest, Abdomen   Body parts bathed by helper: Right arm, Left arm, Chest, Abdomen, Front perineal area, Buttocks, Right upper leg, Left upper leg, Right lower leg, Left lower leg, Face Body parts n/a: Right arm, Left arm, Front perineal area, Buttocks, Right upper leg, Left upper leg, Right lower leg, Left lower leg   Bathing assist Assist Level: Dependent - Patient 0%     Upper Body Dressing/Undressing Upper  body dressing   What is the patient wearing?: Pull over shirt    Upper body assist Assist Level: Dependent - Patient 0%    Lower Body Dressing/Undressing Lower body dressing      What is the patient wearing?: Incontinence brief, Pants     Lower body assist Assist for lower body dressing: Dependent - Patient 0%     Toileting Toileting Toileting Activity did not occur (Clothing management and hygiene only): N/A (no void or bm)  Toileting assist Assist for toileting: Dependent - Patient 0%     Transfers Chair/bed transfer  Transfers assist  Chair/bed transfer activity did not occur: Safety/medical concerns  Chair/bed transfer assist level: Dependent - Patient 0%     Locomotion Ambulation   Ambulation assist   Ambulation activity did not occur: Safety/medical concerns          Walk 10 feet activity   Assist  Walk 10 feet activity did not occur: Safety/medical concerns        Walk 50 feet activity   Assist Walk 50 feet with 2 turns activity did not occur: Safety/medical concerns         Walk 150 feet activity   Assist Walk 150 feet activity did not occur: Safety/medical concerns         Walk 10 feet on uneven surface  activity   Assist Walk 10 feet on uneven surfaces activity did not occur: Safety/medical concerns         Wheelchair     Assist Is the patient using a wheelchair?: Yes Type of Wheelchair: Manual (tilt in space)    Wheelchair assist level: Dependent - Patient 0%      Wheelchair 50 feet with 2 turns activity    Assist    Wheelchair 50 feet with 2 turns activity did not occur: Safety/medical concerns   Assist Level: Dependent - Patient 0%   Wheelchair 150 feet activity     Assist  Wheelchair 150 feet activity did not occur: Safety/medical concerns   Assist Level: Dependent - Patient 0%   Blood pressure 116/73, pulse 78, temperature 98 F (36.7 C), resp. rate 17, height 5\' 1"  (1.549 m), weight  44.8 kg, SpO2 100%.  Medical Problem List and Plan: 1. 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  Impaired mobility: continue Lovenox, weekly creatinine ordered             -antiplatelet therapy: N/A 3. Pain Management: Tylenol as needed  4. History of anxiety: discussed with her mom that she feels this was a big risk factor for her accident  5. Neuropsych/cognition: This patient is not capable of making decisions on her own behalf. 6. Left buttock unstageable ulcer.  Continue Medihoney to buttock wound daily cover with foam dressing.  Changing dressing every 3 days or as needed soiling   7. Fluids/Electrolytes/Nutrition: Routine in and outs with follow-up chemistries  -Labs 7/21 stable.  -Eating 100% most meals with total assist  Last labs 8/7- look good  8/11- eating well per nursing 8.  Cavitary right lower lobe pneumonia likely aspiration pneumonia/MRSA pneumonia.  Resolved    Latest Ref Rng & Units 12/20/2022   10:54 AM 12/11/2022    7:15 AM 12/03/2022    7:18 AM  CBC  WBC 4.0 - 10.5 K/uL 5.6  9.0  8.7   Hemoglobin 12.0 - 15.0 g/dL 86.5  78.4  69.6   Hematocrit 36.0 -  46.0 % 42.8  45.1  39.7   Platelets 150 - 400 K/uL 253  329  218      9.  History of drug abuse.  Positive cocaine on urine drug screen.  Provide counseling  10.  AKI/hypovolemia and ATN.  resolved    Latest Ref Rng & Units 12/29/2022    5:46 AM 12/22/2022   12:16 PM 12/20/2022   10:54 AM  BMP  Glucose 70 - 99 mg/dL   295   BUN 6 - 20 mg/dL   9   Creatinine 2.84 - 1.00 mg/dL 1.32  4.40  1.02   Sodium 135 - 145 mmol/L   137   Potassium 3.5 - 5.1 mmol/L   3.9   Chloride 98 - 111 mmol/L   101   CO2 22 - 32 mmol/L   22   Calcium 8.9 - 10.3 mg/dL   9.1   Recheck labs tomorrow ordered  11.  Mild transaminitis with rhabdomyolysis.  Both resolved  12.  Hypotension. Resolved, d/c midodrine  13. Impaired initiation: continue amantadine back to 100mg  BID  14. Suboptimal vitamin D: continue 1,000U D3 daily  15. Spasticity:   -discussed serial casting with OT and her mom  -increased baclofen to 20mg  TID and tizanidine to 4mg  BID. Continue wrist, elbow brace, and PRAFOs. Add tizanidine 2mg  HS  8/10- suggest low dose Dantrolene or in the long run, a Baclofen pump to be assessed in clinic? 16. Bowel and bladder incontinence: continue bowel and bladder program  -LBM 8/5   8/10- LBM x3 in last 12 hours- small- hard--will add Colace 100 mg daily  8/11- Medium BM today- incontinent- better/less hard than yesterday with Colace  17. Left hand skin maceration: Eucerin ordered, discussed hand split with OT, conitnue  18. Tachycardia: mild,~90bpm decreased amantadine back to 100mg  BID, continue this doseHR stable in 80s Q8/10- BP a little elvated- will con't to monitor for trend    01/01/2023    5:35 AM 12/31/2022   10:05 PM 12/31/2022    8:42 PM  Vitals with BMI  Systolic 116  131  Diastolic 73  78  Pulse 78 83 91    19. History of elevated SBP: Increased tizanidine to 4mg  BID, discussed that blood pressure is controlled  20. Area of redness between left thumb and index finger: continue foam  dressing  21. Scalp eczema: selsun blue ordered, continue  22. Fatigue: improved, discussed with team may be secondary to anti-spasticity medications, B12 reviewed and is 335, in suboptimal range, will give 1,000U B12 injection 8/7, will order monthly as needed  23. Right wrist swelling: XR ordered and shows no acute fracture, shows 2.45mm ulnar variance, brace removed to given patient a break from it. Voltaren gel ordered prn for discomfort, conitnue  23. Dysphagia: continue D3 diet    LOS: 44 days A FACE TO FACE EVALUATION WAS PERFORMED  Drema Pry Williams Dietrick 01/01/2023, 9:42 AM

## 2023-01-01 NOTE — Progress Notes (Signed)
Occupational Therapy Session Note  Patient Details  Name: Nicole [name] MRN: [id number] Date of Birth: June 02, 1975  Today's Date: 01/01/2023 OT Individual Time: 7829-5621 OT Individual Time Calculation (min): 29 min    Short Term Goals: Week 6:  OT Short Term Goal 1 (Week 6): Patient will sit unsupported at edge of bed x 15 seconds in preparation for level surface transfer OT Short Term Goal 2 (Week 6): Patient will demonstrate improved focused attention as evidenced by sustaining eye contact x at least 20 seconds while in supported position. OT Short Term Goal 3 (Week 6): Patient will demonstrate 66* of elbow extension in BUE to reduce caregiver burden with bathing and dressing care  Skilled Therapeutic Interventions/Progress Updates:  Pt greeted seated in w/c, pt itching head with RUE needing MAX cues to redirect. Provided PROM to R elbow and digits to improve positioning and decrease tone. Placed R elbow on pillow to provide support for R elbow during elbow ROM stretches. Pt grimacing during PROM needing rest breaks and repositioning to assist with pain mgmt.  Put pts hair back up as pt had been itching her head so much her ponytail fell out, pt was able to brush her hair with brush in R hand but needed MIN A to full brush out her tangles.   Handed pt directly off to PT.    Therapy Documentation Precautions:  Precautions Precautions: Fall Precaution Comments: Contact precautions & delayed processing Restrictions Weight Bearing Restrictions: No  Pain: pt noted to grimace during ROM, rest breaks and repositioning provided.     Therapy/Group: Individual Therapy  [name] [facility] 01/01/2023, 3:54 PM

## 2023-01-01 NOTE — Progress Notes (Signed)
Physical Therapy Weekly Progress Note  Patient Details  Name: Nicole [name] MRN: [id number] Date of Birth: 06-20-1975  Beginning of progress report period: {Time; dates multiple:304500300} End of progress report period: {Time; dates multiple:304500300}  {CHL IP REHAB PT TIME CALCULATION:304800500}  Patient has met {number 1-5:22450} of {number 1-5:20334} short term goals.  ***  Patient continues to demonstrate the following deficits {impairments:3041632} and therefore will continue to benefit from skilled PT intervention to increase functional independence with mobility.  Patient {LTG progression:3041653}.  {plan of WUJW:1191478}  PT Short Term Goals {GNF:6213086}  Skilled Therapeutic Interventions/Progress Updates:      Therapy Documentation Precautions:  Precautions Precautions: Fall Precaution Comments: Contact precautions & delayed processing Restrictions Weight Bearing Restrictions: No General:   Vital Signs: Therapy Vitals Temp: 98 F (36.7 C) Pulse Rate: 78 Resp: 17 BP: 116/73 Patient Position (if appropriate): Lying Oxygen Therapy SpO2: 100 % O2 Device: Room Air Pain: Pain Assessment Pain Scale: Faces Pain Score: Asleep Faces Pain Scale: Hurts even more Pain Location: Generalized Pain Descriptors / Indicators: Grimacing (with turning) Pain Onset: Gradual Pain Intervention(s): Medication (See eMAR) Vision/Perception     Mobility:   Locomotion :    Trunk/Postural Assessment :    Balance:   Exercises:   Other Treatments:     Therapy/Group: {Therapy/Group:3049007}  Loel Dubonnet 01/01/2023, 8:06 AM

## 2023-01-01 NOTE — Progress Notes (Signed)
Speech Language Pathology Daily Session Note  Patient Details  Name: Nicole [name] MRN: [id number] Date of Birth: 1975-06-16  Today's Date: 01/01/2023 SLP Individual Time: 0900-0940 SLP Individual Time Calculation (min): 40 min  Short Term Goals: Week 6: SLP Short Term Goal 1 (Week 6): Pt will present w/ appropriate response to functional stimuli (i.e. accept spoon, grasp basic item appropriately, response to tactile/auditory stimuli) 15% of the time w/ maxA multimodal cues SLP Short Term Goal 2 (Week 6): Patient will follow 1-step commands within a context in 5% of opportunities with Max A multimodal cues. SLP Short Term Goal 3 (Week 6): Patient will utilize multimodal communication to answer basic yes/no questions with maxA in 5% of opportunities  Skilled Therapeutic Interventions: Skilled treatment session focused on communication and cognitive goals. SLP facilitated session by providing hand over hand to initiate washing her face with a wet washcloth, however, it did appear that the patient was attempting to move her head on command in order to wash the right side of her face. Patient able to answer yes/no questions with extra time in 50% of opportunities when given choices regarding shirt color. Patient also noted to be consistently scratching her face or wiping her nose and in response to a question/joke, patient able to phonate "hmm" on command X 3 but unable to reproduce throughout the remainder of the session. Patient with spontaneous throat clears X 4. Patient left upright in bed with alarm on and mitt in place. Continue with current plan of care.       Pain No indications of pain throughout session   Therapy/Group: Individual Therapy  Johnn Krasowski 01/01/2023, 12:32 PM

## 2023-01-01 NOTE — Progress Notes (Signed)
Occupational Therapy Session Note  Patient Details  Name: Nicole [name] MRN: [id number] Date of Birth: Mar 18, 1976  Today's Date: 01/01/2023 OT Individual Time: 1003-1100 OT Individual Time Calculation (min): 57 min    Short Term Goals: Week 6:  OT Short Term Goal 1 (Week 6): Patient will sit unsupported at edge of bed x 15 seconds in preparation for level surface transfer OT Short Term Goal 2 (Week 6): Patient will demonstrate improved focused attention as evidenced by sustaining eye contact x at least 20 seconds while in supported position. OT Short Term Goal 3 (Week 6): Patient will demonstrate 67* of elbow extension in BUE to reduce caregiver burden with bathing and dressing care  Skilled Therapeutic Interventions/Progress Updates:    Patient made and sustained eye contact and smiled at therapist upon arrival.  When asked if she wanted to get out of bed - patient nodded her head yes.  When asked if she wanted to shower - she shook her head no.  Planned session around those choices.  Patient sat at sink to bathe self.  Washcloth placed in right hand and patient directed to wash face - hand guided to face - patient continued to wash face - left side/ right side/ forehead x 10 seconds.  Then washcloth guided to chest - patient able to wash chest x 3 seconds before returning cloth to face.  Patient given choice of two different shirts on her lap.  Assisted patient to move hand over purple then pink shirt, while asking which shirt do you want to wear today.  Patient made grasping motion over pink shirt (more in midline) Patient assisted to complete bathing and dressing, then transported to gym to address sit to stand and standing using the Sara+ lift.  Patient returned to room and left up in wheelchair with arms on armrests and safety belt in place and engaged.  Personal items/ call bell in reach.    Therapy Documentation Precautions:  Precautions Precautions: Fall Precaution Comments: Contact  precautions & delayed processing Restrictions Weight Bearing Restrictions: No   Pain:  Unable to state - opens eyes large and scowls with range of motion to hands, legs this session.      Therapy/Group: Individual Therapy  [name] 01/01/2023, 12:44 PM

## 2023-01-02 LAB — MRSA NEXT GEN BY PCR, NASAL: MRSA by PCR Next Gen: NOT DETECTED

## 2023-01-02 NOTE — Progress Notes (Signed)
PROGRESS NOTE   Subjective/Complaints: Therapy notes reviewed from yesterday, continues to tolerate therapy, has pain with ROM testing Will repeat MRSA nares today  ROS: Limited by cognition/behavioral  Objective:   No results found. No results for input(s): "WBC", "HGB", "HCT", "PLT" in the last 72 hours.   No results for input(s): "NA", "K", "CL", "CO2", "GLUCOSE", "BUN", "CREATININE", "CALCIUM" in the last 72 hours.   Intake/Output Summary (Last 24 hours) at 01/02/2023 1033 Last data filed at 01/02/2023 0841 Gross per 24 hour  Intake 720 ml  Output --  Net 720 ml      Pressure Injury 10/31/22 Buttocks Left Unstageable - Full thickness tissue loss in which the base of the injury is covered by slough (yellow, tan, gray, green or brown) and/or eschar (tan, brown or black) in the wound bed. (Active)  10/31/22 2000  Location: Buttocks  Location Orientation: Left  Staging: Unstageable - Full thickness tissue loss in which the base of the injury is covered by slough (yellow, tan, gray, green or brown) and/or eschar (tan, brown or black) in the wound bed.  Wound Description (Comments):   Present on Admission: Yes     Pressure Injury 11/18/22 Toe (Comment  which one) Anterior;Right Deep Tissue Pressure Injury - Purple or maroon localized area of discolored intact skin or blood-filled blister due to damage of underlying soft tissue from pressure and/or shear. small are (Active)  11/18/22 1300  Location: Toe (Comment  which one)  Location Orientation: Anterior;Right  Staging: Deep Tissue Pressure Injury - Purple or maroon localized area of discolored intact skin or blood-filled blister due to damage of underlying soft tissue from pressure and/or shear.  Wound Description (Comments): small area on right great toe metatarsal  Present on Admission: Yes    Physical Exam: Vital Signs Blood pressure 137/87, pulse 86, temperature  98.7 F (37.1 C), temperature source Oral, resp. rate 18, height 5\' 1"  (1.549 m), weight 44.8 kg, SpO2 99%.  Gen: no distress, normal appearing HEENT: oral mucosa pink and moist, NCAT Cardio: Reg rate Chest: normal effort, normal rate of breathing Abd: soft, non-distended Ext: no edema Psych: pleasant, normal affect  Skin:  + Scaling dried skin on head - stable, stage 2 to right toe, area of redness between left thumb and index finger-covered in dressing, left buttock unstageable  Neurologic: Does not respond to orientation questions, does not track, does not follow cues. Does not follow verbal commands  Tone: MAS 3 right elbow, B knee flexors, and right hip; MAS 3 L elbow , MS 3-4 in wrist and finger flexors  Motor 0/5 strength in LUE and BLE, some purposeful movement in RUE scratches nose but cannot follow commands to cooperate with MMT   Cerebellar exam unable to assess  Sensation withdraws to pinch BLE and Right index finger Musculoskeletal: reduced bilateral shoulder and elbow ROM due to increased tone  RIght Elbow brace in place Strong neck flexion Right wrist swelling   Assessment/Plan: 1. Functional deficits which require 3+ hours per day of interdisciplinary therapy in a comprehensive inpatient rehab setting. Physiatrist is providing close team supervision and 24 hour management of active medical problems listed below. Physiatrist and  rehab team continue to assess barriers to discharge/monitor patient progress toward functional and medical goals  Care Tool:  Bathing    Body parts bathed by patient: Face, Chest, Abdomen   Body parts bathed by helper: Right arm, Left arm, Chest, Abdomen, Front perineal area, Buttocks, Right upper leg, Left upper leg, Right lower leg, Left lower leg, Face Body parts n/a: Right arm, Left arm, Front perineal area, Buttocks, Right upper leg, Left upper leg, Right lower leg, Left lower leg   Bathing assist Assist Level: Dependent - Patient 0%      Upper Body Dressing/Undressing Upper body dressing   What is the patient wearing?: Pull over shirt    Upper body assist Assist Level: Dependent - Patient 0%    Lower Body Dressing/Undressing Lower body dressing      What is the patient wearing?: Incontinence brief, Pants     Lower body assist Assist for lower body dressing: Dependent - Patient 0%     Toileting Toileting Toileting Activity did not occur (Clothing management and hygiene only): N/A (no void or bm)  Toileting assist Assist for toileting: Dependent - Patient 0%     Transfers Chair/bed transfer  Transfers assist  Chair/bed transfer activity did not occur: Safety/medical concerns  Chair/bed transfer assist level: Dependent - Patient 0%     Locomotion Ambulation   Ambulation assist   Ambulation activity did not occur: Safety/medical concerns          Walk 10 feet activity   Assist  Walk 10 feet activity did not occur: Safety/medical concerns        Walk 50 feet activity   Assist Walk 50 feet with 2 turns activity did not occur: Safety/medical concerns         Walk 150 feet activity   Assist Walk 150 feet activity did not occur: Safety/medical concerns         Walk 10 feet on uneven surface  activity   Assist Walk 10 feet on uneven surfaces activity did not occur: Safety/medical concerns         Wheelchair     Assist Is the patient using a wheelchair?: Yes Type of Wheelchair: Manual (tilt in space)    Wheelchair assist level: Dependent - Patient 0%      Wheelchair 50 feet with 2 turns activity    Assist    Wheelchair 50 feet with 2 turns activity did not occur: Safety/medical concerns   Assist Level: Dependent - Patient 0%   Wheelchair 150 feet activity     Assist  Wheelchair 150 feet activity did not occur: Safety/medical concerns   Assist Level: Dependent - Patient 0%   Blood pressure 137/87, pulse 86, temperature 98.7 F (37.1 C),  temperature source Oral, resp. rate 18, height 5\' 1"  (1.549 m), weight 44.8 kg, SpO2 99%.  Medical Problem List and Plan: 1. 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  Impaired mobility: continue Lovenox, weekly creatinine ordered             -antiplatelet therapy: N/A 3. Pain Management: Tylenol as needed  4. History of anxiety: discussed with her mom that she feels this was a big risk factor for her accident  5. Neuropsych/cognition: This patient is not capable of making decisions on her own behalf. 6. Left buttock unstageable ulcer.  Continue Medihoney to buttock wound daily cover with foam dressing.  Changing dressing every 3 days or as needed soiling   7. Fluids/Electrolytes/Nutrition: Routine in and outs with follow-up chemistries  -Labs 7/21 stable.  -Eating 100% most meals with total assist  Last labs 8/7- look good  8/11- eating well per nursing 8.  Cavitary right lower lobe pneumonia likely aspiration pneumonia/MRSA pneumonia.  Resolved    Latest Ref Rng & Units 12/20/2022   10:54 AM 12/11/2022    7:15 AM 12/03/2022    7:18 AM  CBC  WBC 4.0 - 10.5 K/uL 5.6  9.0   8.7   Hemoglobin 12.0 - 15.0 g/dL 16.1  09.6  04.5   Hematocrit 36.0 - 46.0 % 42.8  45.1  39.7   Platelets 150 - 400 K/uL 253  329  218      9.  History of drug abuse.  Positive cocaine on urine drug screen.  Provide counseling  10.  AKI/hypovolemia and ATN.  resolved    Latest Ref Rng & Units 12/29/2022    5:46 AM 12/22/2022   12:16 PM 12/20/2022   10:54 AM  BMP  Glucose 70 - 99 mg/dL   409   BUN 6 - 20 mg/dL   9   Creatinine 8.11 - 1.00 mg/dL 9.14  7.82  9.56   Sodium 135 - 145 mmol/L   137   Potassium 3.5 - 5.1 mmol/L   3.9   Chloride 98 - 111 mmol/L   101   CO2 22 - 32 mmol/L   22   Calcium 8.9 - 10.3 mg/dL   9.1   Recheck labs tomorrow ordered  11.  Mild transaminitis with rhabdomyolysis.  Both resolved  12.  Hypotension. Resolved, d/c midodrine  13. Impaired initiation: continue amantadine back to 100mg  BID  14. Suboptimal vitamin D: continue 1,000U D3 daily  15. Spasticity:   -discussed serial casting with OT and her mom  -increased baclofen to 20mg  TID and tizanidine to 4mg  BID. Continue wrist, elbow brace, and PRAFOs. Add tizanidine 2mg  HS  8/10- suggest low dose Dantrolene or in the long run, a Baclofen pump to be assessed in clinic? 16. Bowel and bladder incontinence: continue bowel and bladder program  -LBM 8/5   8/10- LBM x3 in last 12 hours- small- hard--will add Colace 100 mg daily  8/11- Medium BM today- incontinent- better/less hard than yesterday with Colace  17. Left hand skin maceration: Eucerin ordered, discussed hand split with OT, conitnue  18. Tachycardia: mild,~90bpm decreased amantadine back to 100mg  BID, continue this doseHR stable in 80s Q8/10- BP a little elvated- will con't to monitor for trend    01/02/2023    4:05 AM 01/01/2023    8:23 PM 01/01/2023    3:49  PM  Vitals with BMI  Systolic 137 139 161  Diastolic 87 87 92  Pulse 86 93 82    19. History of elevated SBP: Increased tizanidine to 4mg  BID, discussed that blood pressure is  controlled  20. Area of redness between left thumb and index finger: continue foam dressing  21. Scalp eczema: selsun blue ordered, continue  22. Fatigue: improved, discussed with team may be secondary to anti-spasticity medications, B12 reviewed and is 335, in suboptimal range, will give 1,000U B12 injection 8/7, will order monthly as needed  23. Right wrist swelling: XR ordered and shows no acute fracture, shows 2.10mm ulnar variance, brace removed to given patient a break from it. Voltaren gel ordered prn for discomfort, continue  23. Dysphagia: continue D3 diet  24. MRSA nares positive: repeat today    LOS: 45 days A FACE TO FACE EVALUATION WAS PERFORMED  Drema Pry Amarise Lillo 01/02/2023, 10:33 AM

## 2023-01-02 NOTE — Progress Notes (Signed)
Physical Therapy Session Note  Patient Details  Name: Nicole [name] MRN: [id number] Date of Birth: 10-02-1975  Today's Date: 01/02/2023 PT Individual Time: 1106-1209 PT Individual Time Calculation (min): 63 min   Short Term Goals: Week 5:  PT Short Term Goal 1 (Week 5): Pt will initiate bed mobility requiring no more than totalA. PT Short Term Goal 1 - Progress (Week 5): Progressing toward goal PT Short Term Goal 2 (Week 5): Pt will maintain sitting balance unsupported for up to 30 seconds PT Short Term Goal 2 - Progress (Week 5): Met PT Short Term Goal 3 (Week 5): Pt will demonstrate command follow for >10% of tasks. PT Short Term Goal 3 - Progress (Week 5): Progressing toward goal PT Short Term Goal 4 (Week 5): [name] RLE will demonstrate improved joint mobility into knee/ hip extension and ankle DF to neutral. PT Short Term Goal 4 - Progress (Week 5): Progressing toward goal Week 6:  PT Short Term Goal 1 (Week 6): Pt will initiate bed mobility requiring no more than totalA. PT Short Term Goal 2 (Week 6): Pt will maintain sitting balance unsupported for up to 45 seconds PT Short Term Goal 3 (Week 6): Pt will demonstrate command follow for >10% of tasks. PT Short Term Goal 4 (Week 6): [name] RLE will demonstrate improved joint mobility into knee/ hip extension and ankle DF to neutral.  Skilled Therapeutic Interventions/Progress Updates:  Patient supine in bed on entrance to room.  Patient awake/ alert and demos turn of eyes to L with additional head turn upon verbal recognition of pt from therapist at door. During session, pt with brief instances of eye turn to verbal stimulus from this therapist with one instance of holding gaze.   Patient with no pain complaint at start of session.  When asked if pt would like to get out of bed, pt demos head nod.   BUE ranging with no braces or AE donned. Ranging for improved positioning during session.  Therapeutic Activity: Bed Mobility: Pt performed  supine <> sit with DEP but increased awareness of mobility. Positioned L elbow to bed surface in assist to prop up to seated position. Vcprovided for all parts of mobility for increased understanding of needed movements from pt. Positioned pt's arms  Transfers: squat pivot performed dependently into w/c with vc provided again for all sequencing of proper technique.   Neuromuscular Re-ed: NMR facilitated during session with focus on standing balance and motor control. Pt guided in  sit<>stand transfers and then time in stance for functional ranging of legs into full extension. Block to knees with Airex pad and hip extension with gait belt assistance with body block to UB for upright extension. Pt guided in lateral weight shifting and minisquats for potential functional movements in order to stimulate muscle activation in BLE. No activation noted.  NMR performed for improvements in motor control and coordination, balance, sequencing, judgement, and self confidence/ efficacy in performing all aspects of mobility at highest level of independence.   Patient seated upright in w/c at end of session with brakes locked, belt alarm set, and all needs within reach.    Therapy Documentation Precautions:  Precautions Precautions: Fall Precaution Comments: Contact precautions & delayed processing Restrictions Weight Bearing Restrictions: No  Pain:  No indication of pain - facial discomfort noted with L finger ranging into extension, especially ring finger.   Therapy/Group: Individual Therapy  Loel Dubonnet PT, DPT, CSRS 01/02/2023, 4:42 PM

## 2023-01-02 NOTE — Progress Notes (Signed)
Kirsteins, Victorino Sparrow, MD  Physician Physical Medicine and Rehabilitation   PMR Pre-admission    Signed   Date of Service: 11/17/2022  2:29 PM  Related encounter: ED to Hosp-Admission (Discharged) from 10/31/2022 in [place] [facility]   Signed     Expand All Collapse All  PMR Admission Coordinator Pre-Admission Assessment   Patient: Nicole [name] is an 47 y.o., female MRN: [id number] DOB: Oct 26, 1975 Height: 5\' 1"  (154.9 cm) Weight: 50.6 kg                                                                                                                                                  Insurance Information HMO:     PPO:      PCP:      IPA:      80/20:      OTHER:  PRIMARY: [place] [employer]     Policy#: [id number]      Subscriber: pt CM Name: Herminio Heads       Phone#:      Fax#: [phone] Pre-Cert#: [id number] auth for CIR faxed from [place] with [place] [employer] for admit 7/5 with updates due 7/11 (7 days) to fax listed above      Employer:  Benefits:  Phone #:   [phone]   Name:  Eff. Date:      Deduct:       Out of Pocket Max:       Life Max:   CIR:   100%   SNF:  Outpatient:      Co-Pay:  Home Health:       Co-Pay:  DME:      Co-Pay:  Providers:  SECONDARY:       Policy#:       Phone#:    Artist:       Phone#:    The Data processing manager" for patients in Inpatient Rehabilitation Facilities with attached "Privacy Act Statement-Health Care Records" was provided and verbally reviewed with: N/A   Emergency Contact Information Contact Information       Name Relation Home Work Mobile    [name],[name] Mother     [phone]         Current Medical History  Patient Admitting Diagnosis: hypoxic brain injury   History of Present Illness: Pt is a 47 y/o female with PMH of polysubstance abuse admitted to [facility] on 10/31/22 after being found down unresponsive.  EMS reports pt was hypoxic on their arrival,  and remained so on 15L NRB, and tachycardic.  She was emergently intubated.  WBC 43.6 on admit, serum osmolality 379, sodium 164, potassium 3.1, creatinine 2.5.  Chest xray showed right basilar airspace disease.  CT head showed symmetric edema within bilateral globus labia concerning for hypoxic injury.  CT abd/pelvic R>L lobe consolidation  and nodular groundglass opacities with lucency in the R lower lobe raising concern for cavitary infection.  UDS cocaine.  F/u MRI showed bilateral globus pallidus injury consistent with hypoxic injury, as well as bilateral watershed distribution infarcts.  Sedation was weaned and pt extubated on 6/23.  .  ID consulted and recommended linezolid and agreed with TEE for assessment given multifocal cavitary PNA with MRSA.  Cardiology consulted for TEE to assess for vegetation given concern for septic emboli to lungs and possibly brain.  If TEE shows vegetation will complete 6 weeks of IV therapy, and if TEE negative then 2 weeks followed by 2-4 weeks of doxy.  Therapy ongoing and pt was recommended for CIR.    Complete NIHSS TOTAL: 19 Glasgow Coma Scale Score: 13   Patient's medical record from [facility] has been reviewed by the rehabilitation admission coordinator and physician.   Past Medical History  History reviewed. No pertinent past medical history.       Has the patient had major surgery during 100 days prior to admission? No   Family History  family history is not on file.     Current Medications   Current Medications    Current Facility-Administered Medications:    0.9 %  sodium chloride infusion, , Intravenous, PRN, Rai, Ripudeep K, MD, Stopped at 11/05/22 1633   0.9 %  sodium chloride infusion, , Intravenous, Continuous, Meng, [place], [place]   acetaminophen (TYLENOL) tablet 650 mg, 650 mg, Oral, Q6H PRN, Rai, Ripudeep K, MD, 650 mg at 11/08/22 2304   Chlorhexidine Gluconate Cloth 2 % PADS 6 each, 6 each, Topical, Q0600, Rai, Ripudeep K, MD, 6 each at  11/10/22 0542   enoxaparin (LOVENOX) injection 40 mg, 40 mg, Subcutaneous, Q24H, Rai, Ripudeep K, MD, 40 mg at 11/10/22 1529   feeding supplement (ENSURE ENLIVE / ENSURE PLUS) liquid 237 mL, 237 mL, Oral, BID BM, Rai, Ripudeep K, MD, 237 mL at 11/09/22 2009   ibuprofen (ADVIL) tablet 200 mg, 200 mg, Oral, Q6H PRN, Rai, Ripudeep K, MD, 200 mg at 11/08/22 1346   leptospermum manuka honey (MEDIHONEY) paste 1 Application, 1 Application, Topical, Daily, Rai, Ripudeep K, MD, 1 Application at 11/10/22 1123   multivitamin with minerals tablet 1 tablet, 1 tablet, Oral, Daily, Rai, Ripudeep K, MD, 1 tablet at 11/09/22 1236   ondansetron (ZOFRAN) injection 4 mg, 4 mg, Intravenous, Q6H PRN, Rolly Salter, MD   Oral care mouth rinse, 15 mL, Mouth Rinse, PRN, Rai, Ripudeep K, MD   pentafluoroprop-tetrafluoroeth (GEBAUERS) aerosol 1 Application, 1 Application, Topical, QID PRN, Rai, Ripudeep K, MD, 1 Application at 11/02/22 0818   polyethylene glycol (MIRALAX / GLYCOLAX) packet 17 g, 17 g, Oral, Daily PRN, Rai, Ripudeep K, MD   sodium chloride flush (NS) 0.9 % injection 10-40 mL, 10-40 mL, Intracatheter, Q12H, Rai, Ripudeep K, MD, 10 mL at 11/10/22 1129   sodium chloride flush (NS) 0.9 % injection 10-40 mL, 10-40 mL, Intracatheter, PRN, Rai, Ripudeep K, MD   vancomycin (VANCOCIN) 500 mg in sodium chloride 0.9 % 100 mL IVPB, 500 mg, Intravenous, Q12H, Rexford Maus, RPH     Patients Current Diet:  Diet Order       None           Precautions / Restrictions Precautions Precautions: Fall Precaution Comments: contact precautions Restrictions Weight Bearing Restrictions: No    Has the patient had 2 or more falls or a fall with injury in the past year?No   Prior Activity Level  Community (5-7x/wk): indep, no DME, not driving, not working   Prior Functional Level Prior Function Prior Level of Function : Independent/Modified Independent, Patient poor historian/Family not available Mobility  Comments: per pt and mother, pt is independent with mobility PTA. [name] states pt would be gone for up to a week at a time ADLs Comments: pt reports independence   Self Care: Did the patient need help bathing, dressing, using the toilet or eating?  Independent   Indoor Mobility: Did the patient need assistance with walking from room to room (with or without device)? Independent   Stairs: Did the patient need assistance with internal or external stairs (with or without device)? Independent   Functional Cognition: Did the patient need help planning regular tasks such as shopping or remembering to take medications? Independent   Patient Information Are you of Hispanic, Latino/a,or Spanish origin?: A. No, not of Hispanic, Latino/a, or Spanish origin What is your race?: A. White Do you need or want an interpreter to communicate with a doctor or health care staff?: 0. No   Patient's Response To:  Health Literacy and Transportation Is the patient able to respond to health literacy and transportation needs?: Yes Health Literacy - How often do you need to have someone help you when you read instructions, pamphlets, or other written material from your doctor or pharmacy?: Never In the past 12 months, has lack of transportation kept you from medical appointments or from getting medications?: No In the past 12 months, has lack of transportation kept you from meetings, work, or from getting things needed for daily living?: No   Home Assistive Devices / Equipment Home Equipment: Gilmer Mor - single point   Prior Device Use: Indicate devices/aids used by the patient prior to current illness, exacerbation or injury? None of the above   Current Functional Level Cognition   Arousal/Alertness: Awake/alert Overall Cognitive Status: Impaired/Different from baseline Current Attention Level: Focused Orientation Level: Oriented to person Following Commands: Follows one step commands with increased  time Safety/Judgement: Decreased awareness of safety, Decreased awareness of deficits General Comments: frequent cueing to follow directions, ablet to give month and year not day of week. Attention: Focused, Sustained Focused Attention: Appears intact Sustained Attention: Impaired Sustained Attention Impairment: Verbal complex Memory: Impaired Memory Impairment:  (Immediate: 5/5; delayed: 0/5; with cues: 0/5) Awareness: Impaired Awareness Impairment: Intellectual impairment    Extremity Assessment (includes Sensation/Coordination)   Upper Extremity Assessment: RUE deficits/detail, LUE deficits/detail RUE Deficits / Details: Difficult to assess 2/2 cognition. Decreased AROM noted this session. Hand tremor noted at rest. LUE Deficits / Details: Difficult to assess 2/2 cognition. Minimal spontaneous or volitional movement this session. Some digit flexion noted with functional task. LUE Coordination: decreased fine motor, decreased gross motor  Lower Extremity Assessment: Defer to PT evaluation RLE Deficits / Details: 2/5 knee extension, other MMT limited by pt command following LLE Deficits / Details: no active movement observed or appreciated during atempted MMT, able to stand without knee blocking so anticipate pt has 3/5 knee extensor strength.     ADLs   Overall ADL's : Needs assistance/impaired Eating/Feeding: Moderate assistance, Sitting Grooming: Moderate assistance, Sitting Grooming Details (indicate cue type and reason): HOH assisted needed to wash face with LUE Upper Body Bathing: Moderate assistance, Sitting Lower Body Bathing: Maximal assistance, +2 for physical assistance, +2 for safety/equipment, Sit to/from stand Upper Body Dressing : Total assistance, Sitting Upper Body Dressing Details (indicate cue type and reason): doffed/donned hospital gown with total A, max cues for  initiation and HOH Lower Body Dressing: Total assistance, Bed level Lower Body Dressing Details  (indicate cue type and reason): total A to don bilat socks at bed level Toilet Transfer: Maximal assistance, Stand-pivot, BSC/3in1 Toilet Transfer Details (indicate cue type and reason): simulated Toileting- Clothing Manipulation and Hygiene: Total assistance Toileting - Clothing Manipulation Details (indicate cue type and reason): total A for posterior hygiene in standing post bowel incontinence. Functional mobility during ADLs: Maximal assistance General ADL Comments: Limited secondary to cognition, BUE weakness, balance     Mobility   Overal bed mobility: Needs Assistance Bed Mobility: Supine to Sit, Sit to Supine Rolling: Total assist Supine to sit: Max assist Sit to supine: Total assist General bed mobility comments: required assistance to move BLEs off EOB and to raise trunk. Total assist to return to supine     Transfers   Overall transfer level: Needs assistance Equipment used: 2 person hand held assist Transfers: Sit to/from Stand Sit to Stand: Max assist, +2 physical assistance, +2 safety/equipment General transfer comment: not attempted     Ambulation / Gait / Stairs / Wheelchair Mobility   Ambulation/Gait General Gait Details: unable this session     Posture / Balance Dynamic Sitting Balance Sitting balance - Comments: sitting EOB with mod to max assist Balance Overall balance assessment: Needs assistance Sitting-balance support: Bilateral upper extremity supported, Feet supported Sitting balance-Leahy Scale: Poor Sitting balance - Comments: sitting EOB with mod to max assist Postural control: Posterior lean Standing balance support: Bilateral upper extremity supported Standing balance-Leahy Scale: Poor Standing balance comment: Max A to maintain standing     Special needs/care consideration Behavioral consideration BI        Previous Home Environment (from acute therapy documentation) Living Arrangements: Parent (mother)  Lives With: Alone Available Help at  Discharge: Family, Other (Comment) (pt's mother works x2 days a week) Type of Home: House Home Layout: One level Home Access: Level entry [employer] Shower/Tub: Health visitor: Standard Additional Comments: Currently lives with mom who works a couple days per week; home setup info gathered from [employer] mom   Discharge Living Setting Plans for Discharge Living Setting: Lives with (comment) (mom, [name]) Type of Home at Discharge: House Discharge Home Layout: One level Discharge Home Access: Level entry Discharge Bathroom Shower/Tub: Walk-in shower Discharge Bathroom Toilet: Standard Discharge Bathroom Accessibility: Yes How Accessible: Accessible via walker   Social/Family/Support Systems Anticipated Caregiver: mom, Vickki Muff Anticipated Caregiver's Contact Information: [phone] Ability/Limitations of Caregiver: min assist at most Caregiver Availability: 24/7 Discharge Plan Discussed with Primary Caregiver: Yes Is Caregiver In Agreement with Plan?: Yes Does Caregiver/Family have Issues with Lodging/Transportation while Pt is in Rehab?: No     Goals Patient/Family Goal for Rehab: PT/OT/SLP supervision to min assist Expected length of stay: 28-30 days Additional Information: Discharge plan: home with mom, [name], who is planning to leave her PT job to care for pt 24/7.  She can provide up to min assist for mobility and ADLs Pt/Family Agrees to Admission and willing to participate: Yes Program Orientation Provided & Reviewed with Pt/Caregiver Including Roles  & Responsibilities: Yes  Barriers to Discharge: Insurance for SNF coverage     Decrease burden of Care through IP rehab admission: n/a     Possible need for SNF placement upon discharge:Not anticipated.  Plan for discharge home with mom, [name], providing 24/7 assist at discharge.  Mom can leave her job to provide min assist.      Patient Condition: This patient's medical and  functional status has  changed since the consult dated: 11/09/22 in which the Rehabilitation Physician determined and documented that the patient's condition is appropriate for intensive rehabilitative care in an inpatient rehabilitation facility. See "History of Present Illness" (above) for medical update. Functional changes are: pt is max assist 1-2. Patient's medical and functional status update has been discussed with the Rehabilitation physician and patient remains appropriate for inpatient rehabilitation. Will admit to inpatient rehab today.   Preadmission Screen Completed By:  Stephania Fragmin, PT, DPT 11/10/2022 5:01 PM ______________________________________________________________________   Discussed status with Dr. Wynn Banker on 11/17/22 at 2:29 PM  and received approval for admission today.   Admission Coordinator:  Stephania Fragmin, PT, DPT time 2:29 PM Dorna Bloom 11/17/22             Revision History

## 2023-01-02 NOTE — Progress Notes (Signed)
Speech Language Pathology Weekly Progress and Session Note  Patient Details  Name: Nicole [name] MRN: [id number] Date of Birth: 03/03/1976  Beginning of progress report period: December 26, 2022 End of progress report period: January 02, 2023  Today's Date: 01/02/2023 SLP Individual Time: 1345-1430 SLP Individual Time Calculation (min): 45 min  Short Term Goals: Week 6: SLP Short Term Goal 1 (Week 6): Pt will present w/ appropriate response to functional stimuli (i.e. accept spoon, grasp basic item appropriately, response to tactile/auditory stimuli) 15% of the time w/ maxA multimodal cues SLP Short Term Goal 1 - Progress (Week 6): Not met SLP Short Term Goal 2 (Week 6): Patient will follow 1-step commands within a context in 5% of opportunities with Max A multimodal cues. SLP Short Term Goal 2 - Progress (Week 6): Met SLP Short Term Goal 3 (Week 6): Patient will utilize multimodal communication to answer basic yes/no questions with maxA in 5% of opportunities SLP Short Term Goal 3 - Progress (Week 6): Not met    New Short Term Goals: Week 7: SLP Short Term Goal 1 (Week 7): Pt will present w/ appropriate response to functional stimuli (i.e. accept spoon, grasp basic item appropriately, response to tactile/auditory stimuli) 15% of the time w/ maxA multimodal cues SLP Short Term Goal 2 (Week 7): Patient will follow 1-step commands within a context in 10% of opportunities with Max A multimodal cues. SLP Short Term Goal 3 (Week 7): Patient will utilize multimodal communication to answer basic yes/no questions with maxA in 5% of opportunities  Weekly Progress Updates:  Pt demonstrates slight progress towards ST goals this week, as she met 1/3 STGs. She continues to require totalA overall for cognition and communication, but demonstrates slightly improved success following very simple/functional 1 step directions and noted to voice spontaneously x3 this week. She appears to demonstrate potential  emerging success w/ use of head nod to answer Y/N questions, however, this remains very inconsistent at this time. She continues to tolerate a Dys 3 diet/thin liquids w/ no concern for oropharyngeal dysphagia. Pt/family education ongoing. She would benefit from continued ST services to target profound cognitive-linguistic deficits remaining, maximize pt independence, and reduce caregiver burden.    Intensity: Minumum of 1-2 x/day, 30 to 90 minutes Frequency: 1 to 3 out of 7 days Duration/Length of Stay: awaiting SNF placement Treatment/Interventions: Cognitive remediation/compensation;Dysphagia/aspiration precaution training;Internal/external aids;Speech/Language facilitation;Cueing hierarchy;Environmental controls;Therapeutic Activities;Functional tasks;Multimodal communication approach;Patient/family education;Therapeutic Exercise   Daily Session  Skilled Therapeutic Interventions:  Following directions (facial movements) groping noted during 3/5 commands. Accurately completed 1/5 with additional time - eyebrow raise.  Biographical Y/N questions via head nod: noted to answer Y x3, no response noted for "N." Unable to answer consistently Hand over hand tracing      Pain  Unable to report pain, appeared comfortable.   Therapy/Group: Individual Therapy  [name] 01/02/2023, 3:32 PM

## 2023-01-02 NOTE — Progress Notes (Signed)
Angelina Sheriff, DO  Physician Physical Medicine and Rehabilitation   Consult Note    Signed   Date of Service: 11/09/2022 10:38 AM  Related encounter: ED to Hosp-Admission (Discharged) from 10/31/2022 in [place] [facility]   Signed     Expand All Collapse All           Physical Medicine and Rehabilitation Consult Reason for Consult: Evaluate appropriateness for Inpatient Rehab Referring Physician: Dr. Lynden Oxford       HPI: Nicole [name] is a 47 y.o. female with PMHx of  has no past medical history on file. . They were admitted to [facility] on 10/31/2022 after she was found unresponsive by EMS with a bystander in a field next to condemned building.  She was hypoxic and required 15 L with EMS, and in the ED evaluation was significant for hypothermia, WBC 43.6, lactic acid 4.4, CK3 100, AST/ALTs mid 100s, serum osmolality 379, sodium 164, creatinine 2.5; she was intubated for airway protection and chest x-ray showed right basilar airspace disease.  UDS on admission positive for cocaine.  CT head showed symmetric edema with bilateral globus labia concerning for hypoxic brain injury, and CT chest showed left lower lobe consolidation with nodular groundglass opacities today's right lower lobe lucency concerning for cavitary infection.  She was started on vancomycin and cefepime, aggressively rehydrated with crystalloid fluids, and admitted to critical care.  MRI showed bilateral globus pallidus injury consistent with cocaine use, and bilateral corona radiata deep infarcts, along with small bifrontal subarachnoid hemorrhage.  Neurosurgery team no acute neurosurgical intervention, supportive care. Infectious disease and pulmonology were consulted regarding necrotizing pneumonia, started switch to cefepime/doxycycline/Unasyn, repeat CT chest chest 6/22 showed progression of necrotizing pneumonia and antibiotics were switched again to linezolid; will need at least 2 weeks  antibiotics and serial chest x-ray at 8 to 10 weeks.  She was extubated 6-23, neurology was consulted on 6/25 due to neurologic deficits on emergence including bilateral lower extremity and left upper extremity weakness, determined no acute stroke workup and likely chronic hypoperfusion contributing to bilateral watershed infarcts, recommended secondary prevention with avoidance of cocaine and hypotension.  Hospitalization has further been complicated by sepsis status post vasopressors, AKI now resolved, hypernatremia now improved, and transaminitis with rhabdomyolysis now improving.  She was found to have a left buttocks unstageable ulcer on admission, managed with foam dressings.  PM&R was consulted to evaluate appropriateness for IPR admission.     Exam, patient is lethargic and encephalopathic, can answer some basic orientation questions but otherwise has difficulty concentrating long enough to answer questions.  Per chart review, dispo planning is home with mother to a single level house with no steps to enter, mother can provide some assistance but works a couple days per week.  Patient was independent prior to admission.      ROS History reviewed. No pertinent past medical history.     History reviewed. No pertinent surgical history.     History reviewed. No pertinent family history.     Social History:  has no history on file for tobacco use, alcohol use, and drug use. Allergies:  Allergies  Not on File   No medications prior to admission.          Home: Home Living Family/patient expects to be discharged to:: Private residence Living Arrangements: Parent (mother) Available Help at Discharge: Family, Other (Comment) (pt's mother works x2 days a week) Type of Home: House Home Access:  Level entry Home Layout: One level Bathroom Shower/Tub: Health visitor: Standard Home Equipment: Cane - single point Additional Comments: Currently lives with mom who works a  couple days per week; home setup info gathered from pt's mom  Lives With: Alone  Functional History: Prior Function Prior Level of Function : Independent/Modified Independent, Patient poor historian/Family not available Mobility Comments: per pt and mother, pt is independent with mobility PTA. [name] states pt would be gone for up to a week at a time ADLs Comments: pt reports independence Functional Status:  Mobility: Bed Mobility Overal bed mobility: Needs Assistance Bed Mobility: Supine to Sit Rolling: Total assist Supine to sit: Max assist, +2 for physical assistance Sit to supine: Max assist, +2 for physical assistance General bed mobility comments: assist for trunk and BLEs Transfers Overall transfer level: Needs assistance Equipment used: 2 person hand held assist Transfers: Sit to/from Stand Sit to Stand: Max assist, +2 physical assistance, +2 safety/equipment General transfer comment: max A +2 to come to stand, L knee flexed throughout with downward gaze with pt unable to lift head to shift gaze up, max A +1 to stand and pivot to chair Ambulation/Gait General Gait Details: unable this session   ADL: ADL Overall ADL's : Needs assistance/impaired Eating/Feeding: Moderate assistance, Sitting Grooming: Moderate assistance, Sitting Grooming Details (indicate cue type and reason): washed face and L hand with washcloth using RUE while sitting EOB with max A for sitting balance and mod cues for thoroughness. L hand placed in lap by therapist during task. Upper Body Bathing: Moderate assistance, Sitting Lower Body Bathing: Maximal assistance, +2 for physical assistance, +2 for safety/equipment, Sit to/from stand Upper Body Dressing : Total assistance, Sitting Upper Body Dressing Details (indicate cue type and reason): doffed/donned hospital gown with total A, max cues for initiation and HOH Lower Body Dressing: Total assistance, Bed level Lower Body Dressing Details (indicate  cue type and reason): total A to don bilat socks at bed level Toilet Transfer: Maximal assistance, Stand-pivot, BSC/3in1 Toilet Transfer Details (indicate cue type and reason): simulated Toileting- Clothing Manipulation and Hygiene: Total assistance Toileting - Clothing Manipulation Details (indicate cue type and reason): total A for posterior hygiene in standing post bowel incontinence. Functional mobility during ADLs: Maximal assistance General ADL Comments: Limited secondary to cognition, BUE weakness, balance   Cognition: Cognition Overall Cognitive Status: Impaired/Different from baseline Arousal/Alertness: Awake/alert Orientation Level: Oriented to person Year: 2024 Month: June Day of Week: Incorrect Attention: Focused, Sustained Focused Attention: Appears intact Sustained Attention: Impaired Sustained Attention Impairment: Verbal complex Memory: Impaired Memory Impairment:  (Immediate: 5/5; delayed: 0/5; with cues: 0/5) Awareness: Impaired Awareness Impairment: Intellectual impairment Cognition Arousal/Alertness: Awake/alert Behavior During Therapy: Flat affect Overall Cognitive Status: Impaired/Different from baseline Area of Impairment: Orientation, Attention, Following commands, Safety/judgement, Problem solving, Awareness Orientation Level: Disoriented to, Time, Situation (oriented to year, disoriented to month and date of birth) Current Attention Level: Focused Memory: Decreased short-term memory Following Commands: Follows one step commands with increased time Safety/Judgement: Decreased awareness of safety, Decreased awareness of deficits Awareness: Intellectual Problem Solving: Slow processing, Decreased initiation, Difficulty sequencing, Requires verbal cues, Requires tactile cues General Comments: Requires max tactile/verbal cues to follow simple 1 step commands   Blood pressure 112/69, pulse 93, temperature 98.6 F (37 C), resp. rate 18, height 5\' 1"  (1.549  m), weight 50.6 kg, SpO2 95 %.   Physical Exam Constitutional: No apparent distress.  Awake, sitting in bed, lethargic. HENT: No JVD. Neck Supple. Trachea midline. +Neck  weakness unable to lift head off pillow.  Eyes: PERRLA. EOMI. nonresponsive to visual field testing.  Blinks to threat bilaterally. Cardiovascular: RRR, no murmurs/rub/gallops. No Edema. Peripheral pulses 2+  Respiratory: CTAB.  No rales, wheezes.  On room air. Abdomen: + bowel sounds, normoactive. No distention or tenderness.  Skin: C/D/I. No apparent lesions. MSK:      ? Mild L torticollis      Strength:                RUE: 3 out of 5 proximally, 2 out of 5 distally                LUE: Absent finger abduction, wrist extension; otherwise 3 out of 5                RLE: 2/5 hip flexion, knee extension; 1/5 dorsiflexion, plantarflexion                LLE: 0/5 throughout with exception of 1 of 5 EHL   Neurologic exam:  Cognition: AAO to person, place; time as June Language: Extremely hypophonic, intelligibility less than 50%.  Speaking in mostly 1-2 word responses. Memory: Recalls 0/3 objects at 5 minutes. Insight: Poor insight into current condition.  Mood: Flat affect Sensation: ?  Decreased throughout left upper extremity and left lower extremity; recall to assess due to patient cognition Reflexes: Hyperreflexic in right upper and lower extremity compared to left upper and lower extremity. Negative Hoffman's and babinski signs bilaterally.  CN: Difficult to assess due to patient participation in exam; grossly intact Coordination: + Right upper extremity myoclonus Spasticity: MAS 2 in right triceps, biceps, finger flexors, knee extensors.  MAS 3 right hip flexor.       Lab Results Last 24 Hours       Results for orders placed or performed during the hospital encounter of 10/31/22 (from the past 24 hour(s))  Glucose, capillary     Status: Abnormal    Collection Time: 11/08/22  2:17 PM  Result Value Ref Range     Glucose-Capillary 136 (H) 70 - 99 mg/dL  Glucose, capillary     Status: Abnormal    Collection Time: 11/08/22  9:08 PM  Result Value Ref Range    Glucose-Capillary 108 (H) 70 - 99 mg/dL  Glucose, capillary     Status: Abnormal    Collection Time: 11/09/22  2:25 AM  Result Value Ref Range    Glucose-Capillary 101 (H) 70 - 99 mg/dL  Basic metabolic panel     Status: Abnormal    Collection Time: 11/09/22  4:23 AM  Result Value Ref Range    Sodium 133 (L) 135 - 145 mmol/L    Potassium 3.1 (L) 3.5 - 5.1 mmol/L    Chloride 100 98 - 111 mmol/L    CO2 26 22 - 32 mmol/L    Glucose, Bld 155 (H) 70 - 99 mg/dL    BUN 24 (H) 6 - 20 mg/dL    Creatinine, Ser 4.09 0.44 - 1.00 mg/dL    Calcium 7.9 (L) 8.9 - 10.3 mg/dL    GFR, Estimated >81 >19 mL/min    Anion gap 7 5 - 15  Phosphorus     Status: None    Collection Time: 11/09/22  4:23 AM  Result Value Ref Range    Phosphorus 4.3 2.5 - 4.6 mg/dL  Magnesium     Status: None    Collection Time: 11/09/22  4:23 AM  Result Value Ref Range  Magnesium 2.0 1.7 - 2.4 mg/dL  Glucose, capillary     Status: None    Collection Time: 11/09/22  8:04 AM  Result Value Ref Range    Glucose-Capillary 84 70 - 99 mg/dL      Imaging Results (Last 48 hours)  No results found.     Assessment/Plan: Diagnosis: Bilateral corona radiata watershed infarcts Does the need for close, 24 hr/day medical supervision in concert with the patient's rehab needs make it unreasonable for this patient to be served in a less intensive setting? 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 Potentially Does the patient require coordinated care of a physician, rehab nurse, therapy disciplines of PT, OT, and SLP to address physical and functional deficits in the context of the above medical diagnosis(es)? Yes Addressing deficits in the following areas: balance, endurance, locomotion, strength, transferring, bowel/bladder control, bathing, dressing, feeding, grooming, toileting, cognition, speech, language, swallowing, and psychosocial support Can the patient actively participate in an intensive therapy program of at least 3 hrs of therapy per day at least 5 days per week? No The potential for patient to make measurable gains while on inpatient rehab is fair Anticipated functional outcomes upon discharge from inpatient rehab are supervision and min assist  with PT, Min A to supervision with OT, min assist with SLP. Estimated rehab length of stay to reach the above functional goals is: 24-28 days Anticipated discharge destination: Home Overall Rehab/Functional Prognosis: fair   POST ACUTE RECOMMENDATIONS: This patient's condition is appropriate for continued rehabilitative care in the following setting: Currently SNF level, may progress to CIR level, see below Patient has agreed to participate in recommended program.  Unable to consent due to current cognitive status Note that insurance prior authorization may be required for reimbursement for recommended care.   Comment: Based on today's assessment, barriers to inpatient rehab would be lethargy limiting 3 hours/day therapy tolerance, and inability of caregivers to provide 24/7 supervision/assistance, which would be her anticipated functional level at discharge.  If unable to coordinate dispo plan with 24/7 caregivers, would recommend SNF.  If able to coordinate 24/7 care, would recommend inpatient rehab  with a fair prognosis for functional gains.     MEDICAL RECOMMENDATIONS: Consider EEG for evaluation given ongoing myoclonus in right upper extremity and extreme lethargy  If no seizures, would initiate Ritalin 5 mg twice daily at 8 AM and 2 PM to improve daytime alertness, concentration, and memory and further assist participation in therapies Would track sleep-wake cycle, and if any concerns for insomnia, initiate trazodone 50 mg nightly as needed and promote daytime arousal and participation     I have personally performed a face to face diagnostic evaluation of this patient. Additionally, I have examined the patient's medical record including any pertinent labs and radiographic images. If the physician assistant has documented in this note, I have reviewed and edited or otherwise concur with the physician assistant's documentation.   Thanks,   Angelina Sheriff, DO 11/09/2022          Routing History

## 2023-01-02 NOTE — Progress Notes (Signed)
Occupational Therapy Session Note  Patient Details  Name: Nicole [name] MRN: [id number] Date of Birth: [birth year]  Today's Date: 01/02/2023 OT Individual Time: [phone] OT Individual Time Calculation (min): 45 min    Short Term Goals: Week 3:  OT Short Term Goal 1 (Week 3): pt will tolerate tilt table to at least 60* for 3 consecutive sessions as precursor to higher level functional mobility tasks OT Short Term Goal 1 - Progress (Week 3): Not met OT Short Term Goal 2 (Week 3): pt will respond to verbal stimuli 3/5 trials to improve attention to task OT Short Term Goal 2 - Progress (Week 3): Met OT Short Term Goal 3 (Week 3): pt will maintain static sitting balance EOB with CGA for at least 10 secs as precursor to higher level ADl tasks OT Short Term Goal 3 - Progress (Week 3): Met Week 4:  OT Short Term Goal 1 (Week 4): Patient will sit unsupported at edge of bed x 15 seconds in preparation for level surface transfer OT Short Term Goal 1 - Progress (Week 4): Progressing toward goal OT Short Term Goal 2 (Week 4): Patient will transition from sit to stand and stand to sit as needed for lower body dressing with max assist OT Short Term Goal 2 - Progress (Week 4): Met OT Short Term Goal 3 (Week 4): Patient will turn head/ eyes toward auditory stimulation either right or left 25%/ time to aide in focused attention OT Short Term Goal 3 - Progress (Week 4): Met OT Short Term Goal 4 (Week 4): Patient will participate following hand over hand assistance in at least one familiar functional task x 5-10 seconds. OT Short Term Goal 4 - Progress (Week 4): Met Week 5:  OT Short Term Goal 1 (Week 5): Patient will sit unsupported at edge of bed x 15 seconds in preparation for level surface transfer OT Short Term Goal 1 - Progress (Week 5): Progressing toward goal OT Short Term Goal 2 (Week 5): Patient will demonstrate improved focused attention as evidenced by sustaining eye contact x at least 10 seconds  while in supported position. OT Short Term Goal 2 - Progress (Week 5): Met OT Short Term Goal 3 (Week 5): Patient will demonstrate improved passive/active range of motion in right fingers to allow improved functional use of RUE for participation in BADL OT Short Term Goal 3 - Progress (Week 5): Met  Skilled Therapeutic Interventions/Progress Updates:    1:1 When came into the room - pt notably uncomfortable with legs bent slightly, increased tone in UEs, sweaty and face red. Repositioned in bed and braces removed. Left hand swollen and sensitive to the touch today and hand is macerated. Repositioned in the bed for comfort and relax. Opportunity for patient to choose a shirt from choice of two but today unable to (maybe due to comfort level despite efforts to help with tone). Pants donned with total A - focus on stretching and trunk rotation with rolling in bed to pull up pants. Pt came to EOB and transferred into the TIS w/c with focus on bilateral LE weight bearing during transfer. At the sink participated in therapeutic bathing focus on allowing stretching with functional reach and visually attending to the task. Shirt donned with total A - decr initiation to help with any steps today.   With chair tilted all the way back performed occipital and neck mobilizations allowing her to relax back into the head rest. Pt even closed her eyes - resting back  into the chair.   [name] head rest changed out to a curved one for more support - later in the morning.    Therapy Documentation Precautions:  Precautions Precautions: Fall Precaution Comments: Contact precautions & delayed processing Restrictions Weight Bearing Restrictions: No  Pain: Pain Assessment Pain Scale: Faces Faces Pain Scale: No hurt Pain with positioning at times - allowed for rest breaks- see above    Therapy/Group: Individual Therapy  Roney Mans [facility] 01/02/2023, 11:07 AM

## 2023-01-02 NOTE — Progress Notes (Signed)
Physical Therapy Session Note  Patient Details  Name: Nicole [name] MRN: [id number] Date of Birth: 1976-02-01  Today's Date: 01/02/2023 PT Individual Time: 1000-1045 PT Individual Time Calculation (min): 45 min   Short Term Goals: Week 5:  PT Short Term Goal 1 (Week 5): Pt will initiate bed mobility requiring no more than totalA. PT Short Term Goal 1 - Progress (Week 5): Progressing toward goal PT Short Term Goal 2 (Week 5): Pt will maintain sitting balance unsupported for up to 30 seconds PT Short Term Goal 2 - Progress (Week 5): Met PT Short Term Goal 3 (Week 5): Pt will demonstrate command follow for >10% of tasks. PT Short Term Goal 3 - Progress (Week 5): Progressing toward goal PT Short Term Goal 4 (Week 5): [name] RLE will demonstrate improved joint mobility into knee/ hip extension and ankle DF to neutral. PT Short Term Goal 4 - Progress (Week 5): Progressing toward goal  Skilled Therapeutic Interventions/Progress Updates: Pt presents sitting in TIS reclined back.  Pt appears to be holding head up for longer periods of time.  Pt does turn head to left slightly and make eye contact w/ PT.  Pt wheeled to dayroom to standing frame.  Pt transferred to standing w/ + 2 and R foot positioned to increase DF.  Pt stood w/ stretch into neutral c/spine flex/ext and weight shift for increased WB to RLE.  Pt doesn't maintain upright head position.  Pt returned to room 2/2 BM.  Pt transferred SPT w/c > bed w/ Total A and sit to supine.  Pt rolls w/ mod A for NT assist to change soiled brief.  Care handed off to NT.     Therapy Documentation Precautions:  Precautions Precautions: Fall Precaution Comments: Contact precautions & delayed processing Restrictions Weight Bearing Restrictions: No General:   Vital Signs:   Pain: Pain Assessment Pain Scale: Faces Faces Pain Scale: No hurt    Therapy/Group: Individual Therapy  [name] 01/02/2023, 1:19 PM

## 2023-01-03 ENCOUNTER — Inpatient Hospital Stay (HOSPITAL_COMMUNITY): Payer: MEDICAID

## 2023-01-03 MED ORDER — L-METHYLFOLATE-B6-B12 3-35-2 MG PO TABS
1.0000 | ORAL_TABLET | Freq: Every day | ORAL | Status: DC
  Administered 2023-01-03 – 2023-01-04 (×2): 1 via ORAL
  Filled 2023-01-03 (×2): qty 1

## 2023-01-03 NOTE — Patient Care Conference (Signed)
Inpatient RehabilitationTeam Conference and Plan of Care Update Date: 01/03/2023   Time: 11:09 AM    Patient Name: Nicole [name]      Medical Record Number: [id number]  Date of Birth: 09/12/1975 Sex: Female         Room/Bed: 4W09C/4W09C-01 Payor Info: Payor: [employer] / Plan: [employer] / Product Type: *No Product type* /    Admit Date/Time:  11/18/2022  3:15 PM  Primary Diagnosis:  Hypoxic brain injury [facility])  Hospital Problems: Principal Problem:   Hypoxic brain injury [facility])    Expected Discharge Date: Expected Discharge Date:  (SNF pending)  Team Members Present: Physician leading conference: Dr. Sula Soda Social Worker Present: Dossie Der, LCSW Nurse Present: Chana Bode, RN PT Present: Casimiro Needle, PT OT Present: Bretta Bang, OT SLP Present: [name], SLP PPS Coordinator present : Fae Pippin, SLP     Current Status/Progress Goal Weekly Team Focus  Bowel/Bladder   Pt incontinent B/B   LBM 01/02/23   Will regain B/B continence   Assist with toileting needs qshift    Swallow/Nutrition/ Hydration               ADL's   dependent/max assist BADL, Now nodding head on occasion to answer yes/no   max / mod assist   focused attention, response to stim.    Mobility   Continued DEP care for all functional mobility. Continued attempts to vocalize this week. Minimal improvement noted in holding head upright when arousal/ alertness increased. New appropriate head nods and shakes to <50% of questions when alert. Definite reactive response to controlled forward LOB with arm movements. Severely flexed posturing with bracing applied to ankles, wrists, elbows and neck. Pt found in BLE flexed positioning - unsure if voluntary. Continued ability to turn eyes and neck to scan L on hearing voice to L. Continued flexor tone noted in RLE.   TotA overall  Barriers: Very slow improvement in low awareness, flexed posturing, rare volitional movements  /// Continued focus on bed mobility, sitting balance, improving alertness and volitional movements with cues, functional transfers, stretching into extension, any NMR and cueing for motor control, DEP ambulation with 3 musketeer assist.    Communication                Safety/Cognition/ Behavioral Observations               Pain   No pain at this time   Will be free from pain   Assess for pain qshift/prn    Skin   Stage ii to left buttocks. Medihoney and foam dressing in place   Will maintain skin integrity with no further breakdown  Assess skin qshift prn, promote healing and assist pt with turning      Discharge Planning:  Continue to await disablity determination so her LTC medicaid is approved and then will have payment source to go to a SNF. Her mom has guardianship court date next week.   Team Discussion: Patient post hypoxic brain injury with slow improvements noted.    Patient on target to meet rehab goals: Attending better and sustaining eye contact. Able to nod yes and scans left to voice. Note reactive response of forward loss of balance but remains dependent for ADLs. Needs total assist for cognition and communication tasks.  *See Care Plan and progress notes for long and short-term goals.   Revisions to Treatment Plan:  MRI of left hand due to edema and pain/guarding Serial casting  with bi-valve of right UE Discontinued precautions for MRSA  Teaching Needs: Safety, medications, dietary modification, transfers, toileting, skin care/wound care, etc.   Current Barriers to Discharge: Home enviroment access/layout, Incontinence, Wound care, and Lack of/limited family support  Possible Resolutions to Barriers: Disability application pending Guardianship court date next week     Medical Summary Current Status: Left hand bruising and swelling, decreased initiation, spasticity, HTN, fatigue  Barriers to Discharge: Medical stability  Barriers to Discharge  Comments: Left hand bruising and swelling, decreased initiation, spasticity, HTN, fatigue Possible Resolutions to [employer] Focus: CT left wrist ordered, continue amantadine, continue to monitor blood pressure TID, B12 supplemented, metanx started   Continued Need for Acute Rehabilitation Level of Care: The patient requires daily medical management by a physician with specialized training in physical medicine and rehabilitation for the following reasons: Direction of a multidisciplinary physical rehabilitation program to maximize functional independence : Yes Medical management of patient stability for increased activity during participation in an intensive rehabilitation regime.: Yes Analysis of laboratory values and/or radiology reports with any subsequent need for medication adjustment and/or medical intervention. : Yes   I attest that I was present, lead the team conference, and concur with the assessment and plan of the team.   Chana Bode B 01/03/2023, 2:40 PM

## 2023-01-03 NOTE — Progress Notes (Signed)
Occupational Therapy Session Note  Patient Details  Name: Nicole [name] MRN: [id number] Date of Birth: [birth year]  Today's Date: 01/03/2023 OT Individual Time: 1120-1203 OT Individual Time Calculation (min): 43 min    Short Term Goals: Week 6:  OT Short Term Goal 1 (Week 6): Patient will sit unsupported at edge of bed x 15 seconds in preparation for level surface transfer OT Short Term Goal 2 (Week 6): Patient will demonstrate improved focused attention as evidenced by sustaining eye contact x at least 20 seconds while in supported position. OT Short Term Goal 3 (Week 6): Patient will demonstrate 74* of elbow extension in BUE to reduce caregiver burden with bathing and dressing care   Skilled Therapeutic Interventions/Progress Updates:    Session focused on focused attention, following commands, eliciting volitional motor movements, and engagement in preferred prior IADL activities. She was taken via w/c to the dayroom. Preferred music played to improve alertness and engagement in task. Large circular crayons used to promote gross palmar grasp with several approaches attempted to illicit functional engagement of any kind- HOH, backward chaining, demonstration/modeling, and max multimodal cueing. She was able to maintain grasp on the crayon in her R hand and initiate bringing toward paper but once her hand made contact she did not have any further attempt at moving UE. Patient required increased time for initiation, cuing, rest breaks, and for completion of tasks throughout session. Utilized therapeutic use of self throughout to promote efficiency. Pt then taken through BUE PROM to maintain joint integrity and muscle length for contracture management. Facial grimacing used as gauge for pain- worse on the L>R but pt was able to tolerate elbows to about 90 degrees, shoulders to 80 degrees, and fingers to -10 degrees from full extension. She returned to her room and was left sitting up in the TIS with the  chair alarm set. Pt drank about 1/2 cup of water during session via straw.   Therapy Documentation Precautions:  Precautions Precautions: Fall Precaution Comments: Contact precautions & delayed processing Restrictions Weight Bearing Restrictions: No  Therapy/Group: Individual Therapy  Crissie Reese 01/03/2023, 11:50 AM

## 2023-01-03 NOTE — Progress Notes (Signed)
PROGRESS NOTE   Subjective/Complaints: Patient's chart reviewed- No issues reported overnight Vitals signs stable  Precautions d/ced since MRSA negative  ROS: Limited by cognition/behavioral  Objective:   No results found. No results for input(s): "WBC", "HGB", "HCT", "PLT" in the last 72 hours.   No results for input(s): "NA", "K", "CL", "CO2", "GLUCOSE", "BUN", "CREATININE", "CALCIUM" in the last 72 hours.   Intake/Output Summary (Last 24 hours) at 01/03/2023 1042 Last data filed at 01/03/2023 2841 Gross per 24 hour  Intake 960 ml  Output --  Net 960 ml      Pressure Injury 10/31/22 Buttocks Left Unstageable - Full thickness tissue loss in which the base of the injury is covered by slough (yellow, tan, gray, green or brown) and/or eschar (tan, brown or black) in the wound bed. (Active)  10/31/22 2000  Location: Buttocks  Location Orientation: Left  Staging: Unstageable - Full thickness tissue loss in which the base of the injury is covered by slough (yellow, tan, gray, green or brown) and/or eschar (tan, brown or black) in the wound bed.  Wound Description (Comments):   Present on Admission: Yes     Pressure Injury 11/18/22 Toe (Comment  which one) Anterior;Right Deep Tissue Pressure Injury - Purple or maroon localized area of discolored intact skin or blood-filled blister due to damage of underlying soft tissue from pressure and/or shear. small are (Active)  11/18/22 1300  Location: Toe (Comment  which one)  Location Orientation: Anterior;Right  Staging: Deep Tissue Pressure Injury - Purple or maroon localized area of discolored intact skin or blood-filled blister due to damage of underlying soft tissue from pressure and/or shear.  Wound Description (Comments): small area on right great toe metatarsal  Present on Admission: Yes    Physical Exam: Vital Signs Blood pressure 130/79, pulse 87, temperature 98.7 F  (37.1 C), temperature source Oral, resp. rate 15, height 5\' 1"  (1.549 m), weight 44.8 kg, SpO2 99%.  Gen: no distress, normal appearing HEENT: oral mucosa pink and moist, NCAT Cardio: Reg rate Chest: normal effort, normal rate of breathing Abd: soft, non-distended Ext: no edema Psych: pleasant, normal affect  Skin:  + Scaling dried skin on head - stable, stage 2 to right toe, area of redness between left thumb and index finger-covered in dressing, left buttock unstageable  Neurologic: Does not respond to orientation questions, does not track, does not follow cues. Does not follow verbal commands  Tone: MAS 3 right elbow, B knee flexors, and right hip; MAS 3 L elbow , MS 3-4 in wrist and finger flexors  Motor 0/5 strength in LUE and BLE, some purposeful movement in RUE scratches nose but cannot follow commands to cooperate with MMT   Cerebellar exam unable to assess  Sensation withdraws to pinch BLE and Right index finger Musculoskeletal: reduced bilateral shoulder and elbow ROM due to increased tone  RIght Elbow brace in place Strong neck flexion Right wrist swelling   Assessment/Plan: 1. Functional deficits which require 3+ hours per day of interdisciplinary therapy in a comprehensive inpatient rehab setting. Physiatrist is providing close team supervision and 24 hour management of active medical problems listed below. Physiatrist and rehab team continue  to assess barriers to discharge/monitor patient progress toward functional and medical goals  Care Tool:  Bathing    Body parts bathed by patient: Face, Chest, Abdomen   Body parts bathed by helper: Right arm, Left arm, Chest, Abdomen, Front perineal area, Buttocks, Right upper leg, Left upper leg, Right lower leg, Left lower leg, Face Body parts n/a: Right arm, Left arm, Front perineal area, Buttocks, Right upper leg, Left upper leg, Right lower leg, Left lower leg   Bathing assist Assist Level: Dependent - Patient 0%      Upper Body Dressing/Undressing Upper body dressing   What is the patient wearing?: Pull over shirt    Upper body assist Assist Level: Dependent - Patient 0%    Lower Body Dressing/Undressing Lower body dressing      What is the patient wearing?: Incontinence brief, Pants     Lower body assist Assist for lower body dressing: Dependent - Patient 0%     Toileting Toileting Toileting Activity did not occur (Clothing management and hygiene only): N/A (no void or bm)  Toileting assist Assist for toileting: Dependent - Patient 0%     Transfers Chair/bed transfer  Transfers assist  Chair/bed transfer activity did not occur: Safety/medical concerns  Chair/bed transfer assist level: Dependent - Patient 0%     Locomotion Ambulation   Ambulation assist   Ambulation activity did not occur: Safety/medical concerns          Walk 10 feet activity   Assist  Walk 10 feet activity did not occur: Safety/medical concerns        Walk 50 feet activity   Assist Walk 50 feet with 2 turns activity did not occur: Safety/medical concerns         Walk 150 feet activity   Assist Walk 150 feet activity did not occur: Safety/medical concerns         Walk 10 feet on uneven surface  activity   Assist Walk 10 feet on uneven surfaces activity did not occur: Safety/medical concerns         Wheelchair     Assist Is the patient using a wheelchair?: Yes Type of Wheelchair: Manual (tilt in space)    Wheelchair assist level: Dependent - Patient 0%      Wheelchair 50 feet with 2 turns activity    Assist    Wheelchair 50 feet with 2 turns activity did not occur: Safety/medical concerns   Assist Level: Dependent - Patient 0%   Wheelchair 150 feet activity     Assist  Wheelchair 150 feet activity did not occur: Safety/medical concerns   Assist Level: Dependent - Patient 0%   Blood pressure 130/79, pulse 87, temperature 98.7 F (37.1 C),  temperature source Oral, resp. rate 15, height 5\' 1"  (1.549 m), weight 44.8 kg, SpO2 99%.  Medical Problem List and Plan: 1. 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  Impaired mobility: continue Lovenox, weekly creatinine ordered             -antiplatelet therapy: N/A 3. Pain Management: Tylenol as needed  4. History of anxiety: discussed with her mom that she feels this was a big risk factor for her accident  5. Neuropsych/cognition: This patient is not capable of making decisions on her own behalf. 6. Left buttock unstageable ulcer.  continue Medihoney to buttock wound daily cover with foam dressing.  Changing dressing every 3 days or as needed soiling   7. Fluids/Electrolytes/Nutrition: Routine in and outs with follow-up chemistries  -Eating 100% most meals with total assist 8.  Cavitary right lower lobe pneumonia likely aspiration pneumonia/MRSA pneumonia.  Resolved    Latest Ref Rng & Units 12/20/2022   10:54 AM 12/11/2022    7:15 AM 12/03/2022    7:18 AM  CBC  WBC 4.0 - 10.5 K/uL 5.6  9.0  8.7   Hemoglobin 12.0 - 15.0 g/dL 16.1  09.6  04.5   Hematocrit 36.0 - 46.0 % 42.8   45.1  39.7   Platelets 150 - 400 K/uL 253  329  218      9.  History of drug abuse.  Positive cocaine on urine drug screen.  Provide counseling  10.  AKI/hypovolemia and ATN.  resolved    Latest Ref Rng & Units 12/29/2022    5:46 AM 12/22/2022   12:16 PM 12/20/2022   10:54 AM  BMP  Glucose 70 - 99 mg/dL   409   BUN 6 - 20 mg/dL   9   Creatinine 8.11 - 1.00 mg/dL 9.14  7.82  9.56   Sodium 135 - 145 mmol/L   137   Potassium 3.5 - 5.1 mmol/L   3.9   Chloride 98 - 111 mmol/L   101   CO2 22 - 32 mmol/L   22   Calcium 8.9 - 10.3 mg/dL   9.1     11.  Mild transaminitis with rhabdomyolysis.  Both resolved  12.  Hypotension. Resolved, d/c midodrine  13. Impaired initiation: continue amantadine back to 100mg  BID  14. Suboptimal vitamin D: continue 1,000U D3 daily  15. Spasticity:   -discussed serial casting with OT and her mom  -increased baclofen to 20mg  TID and tizanidine to 4mg  BID. Continue wrist, elbow brace, and PRAFOs. Add tizanidine 2mg  HS  8/10- suggest low dose Dantrolene or in the long run, a Baclofen pump to be assessed in clinic? 16. Bowel and bladder incontinence: continue bowel and bladder program  -LBM 8/5   8/10- LBM x3 in last 12 hours- small- hard--will add Colace 100 mg daily  8/11- Medium BM today- incontinent- better/less hard than yesterday with Colace  17. Left hand skin maceration: Eucerin ordered, discussed hand split with OT, conitnue  18. Tachycardia: mild,~90bpm decreased amantadine back to 100mg  BID, continue this doseHR stable in 80s Q8/10- BP a little elvated- will con't to monitor for trend    01/03/2023    4:03 AM 01/02/2023    8:08 PM 01/02/2023    2:40 PM  Vitals with BMI  Systolic 130 138 213  Diastolic 79 93 84  Pulse 87 109 90    19. History  of elevated SBP: Increased tizanidine to 4mg  BID, discussed that blood pressure is controlled  20. Area of redness between left thumb and index finger: continue foam dressing  21. Scalp eczema:  selsun blue ordered, continue  22. Fatigue: improved, discussed with team may be secondary to anti-spasticity medications, B12 reviewed and is 335, in suboptimal range, will give 1,000U B12 injection 8/7, will order monthly as needed  23. Right wrist swelling: XR ordered and shows no acute fracture, shows 2.31mm ulnar variance, brace removed to given patient a break from it. Voltaren gel ordered prn for discomfort, continue  23. Dysphagia: continue D3 diet  24. MRSA nares positive: negative on repeat, precautions d/ced    LOS: 46 days A FACE TO FACE EVALUATION WAS PERFORMED  Drema Pry Traci Plemons 01/03/2023, 10:42 AM

## 2023-01-03 NOTE — Progress Notes (Signed)
Occupational Therapy Weekly Progress Note  Patient Details  Name: Nicole [name] MRN: [id number] Date of Birth: April 12, 1976  Beginning of progress report period: December 27, 2022 End of progress report period: January 03, 2023  Today's Date: 01/03/2023 OT Individual Time: 0820-0905 and 4098-1191 OT Individual Time Calculation (min): 45 min and 62 min  Skilled Therapeutic Intervention:   1st Session: Patient received supine in bed.  Brief wet, assisted patient to roll left/right to change into clean dry brief.  Patient assisted to dress lower body in bed.  Patient tracking left and right and maintaining eye contact x several seconds.  Attempted use of marker on whiteboard patient could make mark - unable to discern shape.  Limited visual attention to this task.  Transferred to sitting with max assist, then to wheelchair.  Patient able to wash face once wet washcloth placed in right hand and given directive.  Patient needed hand over hand assist to wash chest initially.  Patient able to grab green shirt - choice of two.  Left up in whhelchair with lap tray and safety belt in place.  Personal items nearby.    2nd Session:   Patient transported to gym to fabricate elbow cast to be used as serial splint.  Patient placed in supine with head on wedge.  Patient very comfortable - actually falling asleep during procedure.  Casted at just beyond 90* elbow- right.  Cast bivalved and strapping added so cast could be removed if necessary.  Cast left on for 1 hour to assess tolerance.  Cast removed and no redness or indication of pressure noted.  Will determine wearing schedule tomorrow.    Patient has met 3 of 3 short term goals.  Patient with improved attention, and improved tolerance to upright sitting.    Patient continues to demonstrate the following deficits: muscle joint tightness, abnormal tone, unbalanced muscle activation, decreased coordination, and decreased motor planning, decreased visual motor skills,  decreased midline orientation and decreased motor planning, decreased initiation, decreased attention, decreased awareness, decreased problem solving, decreased safety awareness, decreased memory, and delayed processing, and decreased sitting balance, decreased standing balance, decreased postural control, hemiplegia, and decreased balance strategies and therefore will continue to benefit from skilled OT intervention to enhance overall performance with BADL and Reduce care partner burden.  Patient progressing toward long term goals..  Continue plan of care.  OT Short Term Goals Week 6:  OT Short Term Goal 1 (Week 6): Patient will sit unsupported at edge of bed x 15 seconds in preparation for level surface transfer OT Short Term Goal 1 - Progress (Week 6): Met OT Short Term Goal 2 (Week 6): Patient will demonstrate improved focused attention as evidenced by sustaining eye contact x at least 20 seconds while in supported position. OT Short Term Goal 2 - Progress (Week 6): Met OT Short Term Goal 3 (Week 6): Patient will demonstrate 5* of elbow extension in BUE to reduce caregiver burden with bathing and dressing care OT Short Term Goal 3 - Progress (Week 6): Met Week 7:  OT Short Term Goal 1 (Week 7): Patient will sit unsupported at edge of bed x 25 seconds in preparation for level surface transfer OT Short Term Goal 2 (Week 7): Patient will demonstrate wrist extension to neutral in bilateral wrists without indication of pain OT Short Term Goal 3 (Week 7): Patient will change orientation of toothbrush in mouth to clean left and right sides of teeth with mod cueing following hand over hand to initiate  Skilled Therapeutic Interventions/Progress Updates:      Therapy Documentation Precautions:  Precautions Precautions: Fall Precaution Comments: Contact precautions & delayed processing Restrictions Weight Bearing Restrictions: No   Pain:  AM Session:  grimaces to range of motion to left  hand   PM Session:  no appearance of pain      Therapy/Group: Individual Therapy  [name] 01/03/2023, 12:58 PM

## 2023-01-03 NOTE — Progress Notes (Signed)
Speech Language Pathology Daily Session Note  Patient Details  Name: ABRIL NEEDLER MRN: [id number] Date of Birth: 23-Oct-1975  Today's Date: 01/03/2023 SLP Individual Time: 1430-1515 SLP Individual Time Calculation (min): 45 min  Short Term Goals: Week 7: SLP Short Term Goal 1 (Week 7): Pt will present w/ appropriate response to functional stimuli (i.e. accept spoon, grasp basic item appropriately, response to tactile/auditory stimuli) 15% of the time w/ maxA multimodal cues SLP Short Term Goal 2 (Week 7): Patient will follow 1-step commands within a context in 10% of opportunities with Max A multimodal cues. SLP Short Term Goal 3 (Week 7): Patient will utilize multimodal communication to answer basic yes/no questions with maxA in 5% of opportunities  Skilled Therapeutic Interventions:   Pt greeted in her chair; awake/alert upon SLP arrival. SLP facilitated a variety of tx tasks targeting functional cognition. SLP initiated functional cognitive task brushing her hair and pt benefited from and over hand assist to complete. During simple Y/N questions re biographical information, she benefited from max-DEP cues to initiate responses via head nod. She was able to nod "no" during 2/5 questions and benefited from hand over hand assist/SLP demo to answer additional questions via head nod. She did nod "Y" spontaneously x1 at the end of the tx session when asked about positioning preference. No overt s/s of airway invasion noted when provided w/ thin liquids via straw and pt noted to grasp cup appropriately in conjunction w/ hand over hand assist from SLP. Also attempted a variety of automatic speech tasks in attempts to elicit any voicing/automatic responses. No vocalizations or attempts of verbal response/mouthing noted when counting 1-20, humming happy birthday, or saying ABCs. However, she did clear her throat when cued to attempt to hum. This was judged to be an attempt to vocalize. Groping noted during 5/5  simple commands (facial movements) and she was able to complete 2/5 commands appropriately (lip pucker and smile). At the end of the tx session, she was left in her chair with the alarm set. Nursing notified of SLP departure to prepare for transport to imaging. Additionally, pt's mom greeted in the hallway and SLP provided additional education re communication attempts, suspected apraxia, and overall progress. Recommend cont ST per POC.    Pain  Pt unable to report pain, appeared comfortable   Therapy/Group: Individual Therapy  [name] 01/03/2023, 3:11 PM

## 2023-01-04 MED ORDER — ZINC SULFATE 220 (50 ZN) MG PO CAPS
220.0000 mg | ORAL_CAPSULE | Freq: Every day | ORAL | Status: DC
  Administered 2023-01-04 – 2023-01-05 (×2): 220 mg via ORAL
  Filled 2023-01-04 (×2): qty 1

## 2023-01-04 MED ORDER — L-METHYLFOLATE-B6-B12 3-35-2 MG PO TABS
1.0000 | ORAL_TABLET | Freq: Two times a day (BID) | ORAL | Status: DC
  Administered 2023-01-04 – 2023-04-22 (×216): 1 via ORAL
  Filled 2023-01-04 (×219): qty 1

## 2023-01-04 MED ORDER — ENSURE ENLIVE PO LIQD
237.0000 mL | Freq: Three times a day (TID) | ORAL | Status: DC
  Administered 2023-01-04 – 2023-01-05 (×2): 237 mL via ORAL

## 2023-01-04 MED ORDER — VITAMIN C 500 MG PO TABS
500.0000 mg | ORAL_TABLET | Freq: Every day | ORAL | Status: DC
  Administered 2023-01-04 – 2023-04-22 (×109): 500 mg via ORAL
  Filled 2023-01-04 (×111): qty 1

## 2023-01-04 MED ORDER — JUVEN PO PACK
1.0000 | PACK | Freq: Two times a day (BID) | ORAL | Status: DC
  Administered 2023-01-05: 1 via ORAL
  Filled 2023-01-04: qty 1

## 2023-01-04 NOTE — Progress Notes (Signed)
Occupational Therapy Session Note  Patient Details  Name: Nicole [name] MRN: [id number] Date of Birth: 05-02-1976  Today's Date: 01/04/2023 OT Individual Time: 0820-0915 OT Individual Time Calculation (min): 55 min    Short Term Goals: Week 7:  OT Short Term Goal 1 (Week 7): Patient will sit unsupported at edge of bed x 25 seconds in preparation for level surface transfer OT Short Term Goal 2 (Week 7): Patient will demonstrate wrist extension to neutral in bilateral wrists without indication of pain OT Short Term Goal 3 (Week 7): Patient will change orientation of toothbrush in mouth to clean left and right sides of teeth with mod cueing following hand over hand to initiate  Skilled Therapeutic Interventions/Progress Updates:    Patient received supine in bed, NT finishing assisting with breakfast.  Patient assisted to sit at edge of bed and transfer to rolling shower chair.  Patient able to bring R hand to hair when under shower stream, brought R hand to face upon command to wash face, but perseverated on chin, needing hand over hand guidance to start washing R/L cheek, forehead.  Would momentarily participate in bathing different body parts, but kept returning to face.  Patient incontinent of large stool - NT made aware.   Patient assisted to dress and complete grooming tasks.  Today when toothbrush placed in R hand, patient opened mouth.  Assisted hand to mouth and patient made back and forth brushing movement.  Unable to change orientation of brush to clean right side of mouth.  Patient left up in wheelchair with R removable arm cast - elbow, Left arm brace.  Provided gentle range of motion to left digits - slowly and gently working with wrist in neutral or slight flexion.  Patient tolerated cast x 3 hours this am without indication of pressure.  Left up in wheelchair with safety belt in place and engaged call bell nearby.    Therapy Documentation Precautions:  Precautions Precautions:  Fall Precaution Comments: Contact precautions & delayed processing Restrictions Weight Bearing Restrictions: No General:   Vital Signs:   Pain: Pain Assessment Pain Scale: Faces Faces Pain Scale: No hurt ADL: ADL Eating: Maximal assistance Where Assessed-Eating: Chair Grooming: Dependent Where Assessed-Grooming: Bed level Upper Body Bathing: Dependent Where Assessed-Upper Body Bathing: Bed level Lower Body Bathing: Dependent Where Assessed-Lower Body Bathing: Bed level Upper Body Dressing: Dependent Where Assessed-Upper Body Dressing: Bed level Lower Body Dressing: Dependent Where Assessed-Lower Body Dressing: Bed level Toileting: Dependent Where Assessed-Toileting: Bed level Toilet Transfer: Unable to assess Psychologist, counselling Transfer: Unable to assess Vision   Perception    Praxis   Balance   Exercises:   Other Treatments:     Therapy/Group: Individual Therapy  [name] 01/04/2023, 12:22 PM

## 2023-01-04 NOTE — Progress Notes (Signed)
Speech Language Pathology Daily Session Note  Patient Details  Name: Nicole [name] MRN: [id number] Date of Birth: [birth year]  Today's Date: 01/04/2023 SLP Individual Time: 1000-1100 SLP Individual Time Calculation (min): 60 min  Short Term Goals: Week 7: SLP Short Term Goal 1 (Week 7): Pt will present w/ appropriate response to functional stimuli (i.e. accept spoon, grasp basic item appropriately, response to tactile/auditory stimuli) 15% of the time w/ maxA multimodal cues SLP Short Term Goal 2 (Week 7): Patient will follow 1-step commands within a context in 10% of opportunities with Max A multimodal cues. SLP Short Term Goal 3 (Week 7): Patient will utilize multimodal communication to answer basic yes/no questions with maxA in 5% of opportunities  Skilled Therapeutic Interventions:   Pt greeted at bedside. She was awake in her TIS chair upon SLP arrival. SLP facilitated functional tx tasks targeting cognition and expression. She benefited from maxA visual/verbal cues and additional time to answer 2/6 biographical Y/N questions via head nod. Slight nod Y and N noted today. The head nod utilized was minimal, though interpreted as appropriate and meaningful attempt to respond. During task following simple 1 step commands (facial movements), groping noted during 4/5 commands. She was able to complete 2/5 commands accurately (open mouth and smile) after SLP demo and when provided w/ additional processing time. SLP then facilitated automatic speech tasks encouraging vocalizations and spontaneous responses. No vocalizations or attempts noted during humming, singing, or automatic sequencing tasks (1-20, ABCs). However, after initial hand over hand assist from SLP, she was able to tap along to a slow paced beat and maintain for ~30 seconds. Given increased interaction and responsiveness to her environment lately, SLP left the TV on low volume for pt upon her departure. Additionally, she was left reclined in  her chair with the chair alarm set. Recommend cont ST per POC.   Pain Pain Assessment Pain Scale: Faces Faces Pain Scale: No hurt  Therapy/Group: Individual Therapy  [name] 01/04/2023, 10:27 AM

## 2023-01-04 NOTE — Progress Notes (Signed)
PROGRESS NOTE   Subjective/Complaints: No new complaints this morning CT reviewed and with no acute osseus injuries No issues overnight  ROS: Limited by cognition/behavioral  Objective:   CT WRIST LEFT WO CONTRAST  Result Date: 01/04/2023 CLINICAL DATA:  Left wrist bruising.  No definite injury. EXAM: CT OF THE LEFT WRIST WITHOUT CONTRAST TECHNIQUE: Multidetector CT imaging was performed according to the standard protocol. Multiplanar CT image reconstructions were also generated. RADIATION DOSE REDUCTION: This exam was performed according to the departmental dose-optimization program which includes automated exposure control, adjustment of the mA and/or kV according to patient size and/or use of iterative reconstruction technique. COMPARISON:  None Available. FINDINGS: Examination is limited by motion artifact. Bones/Joint/Cartilage No definite acute fracture or dislocation. Joint spaces are preserved. Small cyst in the distal ulna. No joint effusion. Ligaments Ligaments are suboptimally evaluated by CT. Muscles and Tendons Grossly intact. Soft tissue No fluid collection or hematoma.  No soft tissue mass. IMPRESSION: 1. Limited examination due to motion artifact. No definite acute osseous abnormality. Electronically Signed   By: Obie Dredge M.D.   On: 01/04/2023 09:38   No results for input(s): "WBC", "HGB", "HCT", "PLT" in the last 72 hours.   No results for input(s): "NA", "K", "CL", "CO2", "GLUCOSE", "BUN", "CREATININE", "CALCIUM" in the last 72 hours.   Intake/Output Summary (Last 24 hours) at 01/04/2023 1057 Last data filed at 01/04/2023 0900 Gross per 24 hour  Intake 533 ml  Output --  Net 533 ml      Pressure Injury 10/31/22 Buttocks Left Unstageable - Full thickness tissue loss in which the base of the injury is covered by slough (yellow, tan, gray, green or brown) and/or eschar (tan, brown or black) in the wound bed.  (Active)  10/31/22 2000  Location: Buttocks  Location Orientation: Left  Staging: Unstageable - Full thickness tissue loss in which the base of the injury is covered by slough (yellow, tan, gray, green or brown) and/or eschar (tan, brown or black) in the wound bed.  Wound Description (Comments):   Present on Admission: Yes     Pressure Injury 11/18/22 Toe (Comment  which one) Anterior;Right Deep Tissue Pressure Injury - Purple or maroon localized area of discolored intact skin or blood-filled blister due to damage of underlying soft tissue from pressure and/or shear. small are (Active)  11/18/22 1300  Location: Toe (Comment  which one)  Location Orientation: Anterior;Right  Staging: Deep Tissue Pressure Injury - Purple or maroon localized area of discolored intact skin or blood-filled blister due to damage of underlying soft tissue from pressure and/or shear.  Wound Description (Comments): small area on right great toe metatarsal  Present on Admission: Yes    Physical Exam: Vital Signs Blood pressure 133/84, pulse 88, temperature 97.8 F (36.6 C), resp. rate 15, height 5\' 1"  (1.549 m), weight 44.2 kg, SpO2 99%.  Gen: no distress, normal appearing HEENT: oral mucosa pink and moist, NCAT Cardio: Reg rate Chest: normal effort, normal rate of breathing Abd: soft, non-distended Ext: no edema Psych: nonverbal  Skin:  + Scaling dried skin on head - stable, stage 2 to right toe, area of redness between left thumb and index  finger-covered in dressing, left buttock unstageable  Neurologic: Does not respond to orientation questions, does not track, does not follow cues. Does not follow verbal commands  Tone: MAS 3 right elbow, B knee flexors, and right hip; MAS 3 L elbow , MS 3-4 in wrist and finger flexors  Motor 0/5 strength in LUE and BLE, some purposeful movement in RUE scratches nose but cannot follow commands to cooperate with MMT   Cerebellar exam unable to assess  Sensation  withdraws to pinch BLE and Right index finger Musculoskeletal: reduced bilateral shoulder and elbow ROM due to increased tone  RIght Elbow brace in place Strong neck flexion Right wrist swelling   Assessment/Plan: 1. Functional deficits which require 3+ hours per day of interdisciplinary therapy in a comprehensive inpatient rehab setting. Physiatrist is providing close team supervision and 24 hour management of active medical problems listed below. Physiatrist and rehab team continue to assess barriers to discharge/monitor patient progress toward functional and medical goals  Care Tool:  Bathing    Body parts bathed by patient: Face, Chest, Abdomen   Body parts bathed by helper: Right arm, Left arm, Chest, Abdomen, Front perineal area, Buttocks, Right upper leg, Left upper leg, Right lower leg, Left lower leg, Face Body parts n/a: Right arm, Left arm, Front perineal area, Buttocks, Right upper leg, Left upper leg, Right lower leg, Left lower leg   Bathing assist Assist Level: Maximal Assistance - Patient 24 - 49%     Upper Body Dressing/Undressing Upper body dressing   What is the patient wearing?: Pull over shirt    Upper body assist Assist Level: Total Assistance - Patient < 25%    Lower Body Dressing/Undressing Lower body dressing      What is the patient wearing?: Pants, Incontinence brief     Lower body assist Assist for lower body dressing: Dependent - Patient 0%     Toileting Toileting Toileting Activity did not occur (Clothing management and hygiene only): N/A (no void or bm)  Toileting assist Assist for toileting: Dependent - Patient 0%     Transfers Chair/bed transfer  Transfers assist  Chair/bed transfer activity did not occur: Safety/medical concerns  Chair/bed transfer assist level: Total Assistance - Patient < 25%     Locomotion Ambulation   Ambulation assist   Ambulation activity did not occur: Safety/medical concerns          Walk 10  feet activity   Assist  Walk 10 feet activity did not occur: Safety/medical concerns        Walk 50 feet activity   Assist Walk 50 feet with 2 turns activity did not occur: Safety/medical concerns         Walk 150 feet activity   Assist Walk 150 feet activity did not occur: Safety/medical concerns         Walk 10 feet on uneven surface  activity   Assist Walk 10 feet on uneven surfaces activity did not occur: Safety/medical concerns         Wheelchair     Assist Is the patient using a wheelchair?: Yes Type of Wheelchair: Manual (tilt in space)    Wheelchair assist level: Dependent - Patient 0%      Wheelchair 50 feet with 2 turns activity    Assist    Wheelchair 50 feet with 2 turns activity did not occur: Safety/medical concerns   Assist Level: Dependent - Patient 0%   Wheelchair 150 feet activity     Assist  Wheelchair  150 feet activity did not occur: Safety/medical concerns   Assist Level: Dependent - Patient 0%   Blood pressure 133/84, pulse 88, temperature 97.8 F (36.6 C), resp. rate 15, height 5\' 1"  (1.549 m), weight 44.2 kg, SpO2 99%.  Medical Problem List and Plan: 1. 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  Impaired mobility: continue Lovenox, weekly creatinine ordered             -antiplatelet therapy: N/A 3. Pain Management: Tylenol as needed  4. History of anxiety: discussed with her mom that she feels this was a big risk factor for her accident  5. Neuropsych/cognition: This patient is not capable of making decisions on her own behalf. 6. Left buttock unstageable ulcer.  continue Medihoney to buttock wound daily cover with foam dressing.  Changing dressing every 3 days or as needed soiling   7. Fluids/Electrolytes/Nutrition: Routine in and outs with follow-up chemistries  -Eating 100% most meals with total assist 8.  Cavitary right lower lobe pneumonia likely aspiration pneumonia/MRSA pneumonia.  Resolved    Latest Ref Rng & Units 12/20/2022   10:54 AM 12/11/2022    7:15 AM 12/03/2022    7:18 AM  CBC  WBC 4.0 - 10.5 K/uL 5.6  9.0  8.7   Hemoglobin 12.0 - 15.0 g/dL 16.1  09.6  04.5   Hematocrit 36.0 - 46.0 % 42.8  45.1  39.7   Platelets 150 - 400 K/uL 253  329  218      9.  History of drug abuse.  Positive cocaine on urine drug screen.  Provide counseling  10.  AKI/hypovolemia and ATN.  resolved    Latest Ref Rng & Units 12/29/2022    5:46 AM 12/22/2022   12:16 PM 12/20/2022   10:54 AM  BMP  Glucose 70 - 99 mg/dL   409   BUN 6 - 20 mg/dL   9   Creatinine 8.11 - 1.00 mg/dL 9.14  7.82  9.56   Sodium 135 - 145 mmol/L   137   Potassium 3.5 - 5.1 mmol/L   3.9   Chloride 98 - 111 mmol/L   101   CO2 22 - 32 mmol/L   22   Calcium 8.9 - 10.3 mg/dL   9.1     11.  Mild transaminitis with rhabdomyolysis.  Both resolved  12.  Hypotension. Resolved, d/c midodrine  13. Impaired initiation: continue amantadine back to 100mg  BID  14. Suboptimal vitamin D: continue 1,000U D3 daily  15. Spasticity:   -discussed serial casting with OT and her mom  -increased baclofen to 20mg  TID and tizanidine  to 4mg  BID. Continue wrist, elbow brace, and PRAFOs. Add tizanidine 2mg  HS  8/10- suggest low dose Dantrolene or in the long run, a Baclofen pump to be assessed in clinic? 16. Bowel and bladder incontinence: continue bowel and bladder program  -LBM 8/5   8/10- LBM x3 in last 12 hours- small- hard--will add Colace 100 mg daily  8/11- Medium BM today- incontinent- better/less hard than yesterday with Colace  17. Left hand skin maceration: Eucerin ordered, discussed hand split with OT, conitnue  18. Tachycardia: mild,~90bpm decreased amantadine back to 100mg  BID, continue this doseHR stable in 80s Q8/10- BP a little elvated- will con't to monitor for trend    01/04/2023    5:35 AM 01/03/2023    7:49 PM 01/03/2023    3:36 PM  Vitals with BMI  Weight   97 lbs 7 oz  Systolic 133 139   Diastolic 84 90   Pulse 88 64     19. History of elevated SBP: Increased tizanidine to 4mg  BID, discussed that blood pressure is controlled  20. Area of redness between left thumb and index finger: continue foam dressing  21. Scalp eczema: selsun blue ordered, continue  22. Fatigue: improved, discussed with team may be secondary to anti-spasticity medications, B12 reviewed and is 335, in suboptimal range, will give 1,000U B12 injection 8/7, will order monthly as needed, metanx started, increase to BID  23. Right wrist swelling: XR ordered and shows no acute fracture, shows 2.44mm ulnar variance, brace removed to given patient a break from it. Voltaren gel ordered prn for discomfort, continue, CT ordered and show no acute osseus injury  23. Dysphagia: continue D3/thins  24. MRSA nares positive: negative on repeat, precautions d/ced    LOS: 47 days A FACE TO FACE EVALUATION WAS PERFORMED  Drema Pry Maaliyah Adolph 01/04/2023, 10:57 AM

## 2023-01-04 NOTE — Progress Notes (Signed)
Physical Therapy Session Note  Patient Details  Name: Nicole [name] MRN: [id number] Date of Birth: 1976/04/12  Today's Date: 01/04/2023 PT Individual Time: 1300-1415 PT Individual Time Calculation (min): 75 min   Short Term Goals: Week 5:  PT Short Term Goal 1 (Week 5): Pt will initiate bed mobility requiring no more than totalA. PT Short Term Goal 1 - Progress (Week 5): Progressing toward goal PT Short Term Goal 2 (Week 5): Pt will maintain sitting balance unsupported for up to 30 seconds PT Short Term Goal 2 - Progress (Week 5): Met PT Short Term Goal 3 (Week 5): Pt will demonstrate command follow for >10% of tasks. PT Short Term Goal 3 - Progress (Week 5): Progressing toward goal PT Short Term Goal 4 (Week 5): [name] RLE will demonstrate improved joint mobility into knee/ hip extension and ankle DF to neutral. PT Short Term Goal 4 - Progress (Week 5): Progressing toward goal  Skilled Therapeutic Interventions/Progress Updates: Pt presents reclined back in TIS and unable to verbally agree to therapy.  Pt does minimally raise head or eyes to PT voice to left.  Pt resting w/ head cushioned to R side 2/2 forward and laterally turned to right.  Pt reclined further w/ pressure to head for increased extension and L SB to neutral.  Pt wheeled to small gym and performed total A SPT to mat table.  PT requests pt reach to right for sit to supine transfer and pt does attempt reaching w/ RUE when assisted to supine.  Pt does seem to be nodding head appropriately to yes/no questions x 40%.  Pt LES stretched into extension w/ some grimacing noted.  Pt allowed elbow ext stretch to slightly greater than 90 degrees.  Pt rolled onto L side for increased stretch into L sidebending w/ gravity.  Pt returned to room 2/2 soiled brief.  Pt transfers sit to stand and SPT w/ total A.  Pt rolled multiple x side to side for brief change and pericare.  Pt remained supine w/ PRAFOs to B LES w/ increased HC stretch.        Therapy Documentation Precautions:  Precautions Precautions: Fall Precaution Comments: Contact precautions & delayed processing Restrictions Weight Bearing Restrictions: No General:   Vital Signs:   Pain: unable to verbalize, grimaces when painful.      Therapy/Group: Individual Therapy  [name] 01/04/2023, 2:19 PM

## 2023-01-04 NOTE — Progress Notes (Signed)
Initial Nutrition Assessment  DOCUMENTATION CODES:   Severe malnutrition in context of acute illness/injury  INTERVENTION:  - Add Ensure Enlive po TID, each supplement provides 350 kcal and 20 grams of protein.  - Add -1 packet Juven BID, each packet provides 95 calories, 2.5 grams of protein (collagen), and 9.8 grams of carbohydrate (3 grams sugar); also contains 7 grams of L-arginine and L-glutamine, 300 mg vitamin C, 15 mg vitamin E, 1.2 mcg vitamin B-12, 9.5 mg zinc, 200 mg calcium, and 1.5 g  Calcium Beta-hydroxy-Beta-methylbutyrate to support wound healing  - Add Vit C daily.   - Add Zinc x14 days.   NUTRITION DIAGNOSIS:   Severe Malnutrition related to acute illness as evidenced by moderate fat depletion, moderate muscle depletion.  GOAL:   Patient will meet greater than or equal to 90% of their needs  MONITOR:   PO intake, Supplement acceptance  REASON FOR ASSESSMENT:   Malnutrition Screening Tool    ASSESSMENT:   47 y.o. 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 PMH includes: anxiety, asthma, depression.  Meds reviewed:  Vit D3, colace, MVI. Labs reviewed: WDL.   The pt is oriented x1 and mute. RD unable to gather nutrition hx details. Per record, pt has mostly been eating 75-100% of her meals since admission to rehab. Pt has experienced a 4.5% wt loss in the past month. Pt with increased nutrient needs related to unstageable wound. RD will add Ensure with meals, Juven and Vit C and Zinc to promote wound healing. Will continue to monitor PO intakes.   NUTRITION - FOCUSED PHYSICAL EXAM:  Flowsheet Row Most Recent Value  Orbital Region Moderate depletion  Upper Arm Region Mild depletion  Thoracic and Lumbar Region Unable to assess  Buccal Region Mild depletion  Temple Region Moderate depletion  Clavicle Bone Region Moderate depletion  Clavicle and Acromion Bone Region  Moderate depletion  Scapular Bone Region Moderate depletion  Dorsal Hand Unable to assess  Patellar Region Mild depletion  Anterior Thigh Region Mild depletion  Posterior Calf Region Mild depletion  Edema (RD Assessment) None  Hair Reviewed  Eyes Reviewed  Mouth Reviewed  Skin Reviewed  Nails Reviewed       Diet Order:   Diet Order             DIET DYS 3 Room service appropriate? Yes; Fluid consistency: Thin  Diet effective now                   EDUCATION NEEDS:   Not appropriate for education at this time  Skin:  Skin Assessment: Skin Integrity Issues: Skin Integrity Issues:: Unstageable Unstageable: L buttocks  Last BM:  8/22 - type 4  Height:   Ht Readings from Last 1 Encounters:  11/18/22 5\' 1"  (1.549 m)    Weight:   Wt Readings from Last 1 Encounters:  01/03/23 44.2 kg    Ideal Body Weight:     BMI:  Body mass index is 18.41 kg/m.  Estimated Nutritional Needs:   Kcal:  1600-1800  Protein:  80-100 gm  Fluid:  >/= 1.6 L  Bethann Humble, RD, LDN, CNSC.

## 2023-01-04 NOTE — Progress Notes (Signed)
Patient ID: Nicole [name], female   DOB: 02-14-1976, 47 y.o.   MRN: [id number]  Spoke with Mom to update regarding team conference progress with level of alertness and making eye contact. Guardianship court date 8/29. SSD and Medicaid still pending.

## 2023-01-05 DIAGNOSIS — E43 Unspecified severe protein-calorie malnutrition: Secondary | ICD-10-CM | POA: Insufficient documentation

## 2023-01-05 LAB — CREATININE, SERUM
Creatinine, Ser: 0.56 mg/dL (ref 0.44–1.00)
GFR, Estimated: >60 mL/min (ref >60)

## 2023-01-05 NOTE — Progress Notes (Signed)
Physical Therapy Session Note  Patient Details  Name: Nicole [name] MRN: [id number] Date of Birth: [birth year]  Today's Date: 01/05/2023 PT Individual Time: 0804-0904 PT Individual Time Calculation (min): 60 min   Short Term Goals: Week 5:  PT Short Term Goal 1 (Week 5): Pt will initiate bed mobility requiring no more than totalA. PT Short Term Goal 1 - Progress (Week 5): Progressing toward goal PT Short Term Goal 2 (Week 5): Pt will maintain sitting balance unsupported for up to 30 seconds PT Short Term Goal 2 - Progress (Week 5): Met PT Short Term Goal 3 (Week 5): Pt will demonstrate command follow for >10% of tasks. PT Short Term Goal 3 - Progress (Week 5): Progressing toward goal PT Short Term Goal 4 (Week 5): [name] RLE will demonstrate improved joint mobility into knee/ hip extension and ankle DF to neutral. PT Short Term Goal 4 - Progress (Week 5): Progressing toward goal Week 6:  PT Short Term Goal 1 (Week 6): Pt will initiate bed mobility requiring no more than totalA. PT Short Term Goal 2 (Week 6): Pt will maintain sitting balance unsupported for up to 45 seconds PT Short Term Goal 3 (Week 6): Pt will demonstrate command follow for >10% of tasks. PT Short Term Goal 4 (Week 6): [name] RLE will demonstrate improved joint mobility into knee/ hip extension and ankle DF to neutral.  Skilled Therapeutic Interventions/Progress Updates:  Patient supine in bed on entrance to room.  Body in flexed position and slightly sidelying to R side. Patient awake/ alert and demos turn of eyes to L with minimal head turn upon verbal recognition of therapist at door. During session, pt with brief instances of eye turn to verbal stimulus from this therapist with one instance of holding gaze.    Patient with no pain complaint at start of session.   BLE slowly mobilized into hip and knee extension. BUE ranging with no braces or AE donned. Ranging for improved positioning during session. All movements with noted  discomfort from facial grimace but improves briefly with hold in position.    Therapeutic Activity: Bed Mobility: Pt performed supine <> sit with DEP but increased awareness of mobility. Positioned L elbow to bed surface in assist to prop up to seated position. Vc provided for all parts of mobility for increased understanding of needed movements from pt.  Transfers: squat pivot performed dependently into w/c with vc provided again for all sequencing of proper technique.    Neuromuscular Re-ed: NMR facilitated during session with focus on standing balance and motor control. Pt guided in  sit<>stand transfer using standing frame and then time spent in stance for functional ranging of legs into full extension. [name] R heel never touches ground d/t continuing contracture.  [name] arms positioned in shoulder abduction.............   NMR performed for improvements in motor control and coordination, balance, sequencing, judgement, and self confidence/ efficacy in performing all aspects of mobility at highest level of independence.    Patient seated upright in w/c at end of session with brakes locked, belt alarm set, and all needs within reach.    Therapy Documentation Precautions:  Precautions Precautions: Fall Precaution Comments: Contact precautions & delayed processing Restrictions Weight Bearing Restrictions: No  Pain:     Therapy/Group: Individual Therapy  Loel Dubonnet PT, DPT, CSRS 01/05/2023, 7:48 AM

## 2023-01-05 NOTE — Progress Notes (Signed)
PROGRESS NOTE   Subjective/Complaints: Patient's chart reviewed- No issues reported overnight Vitals signs stable except for increase in systolic blood pressure  ROS: Limited by cognition/behavioral  Objective:   CT WRIST LEFT WO CONTRAST  Result Date: 01/04/2023 CLINICAL DATA:  Left wrist bruising.  No definite injury. EXAM: CT OF THE LEFT WRIST WITHOUT CONTRAST TECHNIQUE: Multidetector CT imaging was performed according to the standard protocol. Multiplanar CT image reconstructions were also generated. RADIATION DOSE REDUCTION: This exam was performed according to the departmental dose-optimization program which includes automated exposure control, adjustment of the mA and/or kV according to patient size and/or use of iterative reconstruction technique. COMPARISON:  None Available. FINDINGS: Examination is limited by motion artifact. Bones/Joint/Cartilage No definite acute fracture or dislocation. Joint spaces are preserved. Small cyst in the distal ulna. No joint effusion. Ligaments Ligaments are suboptimally evaluated by CT. Muscles and Tendons Grossly intact. Soft tissue No fluid collection or hematoma.  No soft tissue mass. IMPRESSION: 1. Limited examination due to motion artifact. No definite acute osseous abnormality. Electronically Signed   By: Obie Dredge M.D.   On: 01/04/2023 09:38   No results for input(s): "WBC", "HGB", "HCT", "PLT" in the last 72 hours.   Recent Labs    01/05/23 0657  CREATININE 0.56     Intake/Output Summary (Last 24 hours) at 01/05/2023 1133 Last data filed at 01/05/2023 9604 Gross per 24 hour  Intake 1060 ml  Output --  Net 1060 ml      Pressure Injury 10/31/22 Buttocks Left Unstageable - Full thickness tissue loss in which the base of the injury is covered by slough (yellow, tan, gray, green or brown) and/or eschar (tan, brown or black) in the wound bed. (Active)  10/31/22 2000  Location:  Buttocks  Location Orientation: Left  Staging: Unstageable - Full thickness tissue loss in which the base of the injury is covered by slough (yellow, tan, gray, green or brown) and/or eschar (tan, brown or black) in the wound bed.  Wound Description (Comments):   Present on Admission: Yes     Pressure Injury 11/18/22 Toe (Comment  which one) Anterior;Right Deep Tissue Pressure Injury - Purple or maroon localized area of discolored intact skin or blood-filled blister due to damage of underlying soft tissue from pressure and/or shear. small are (Active)  11/18/22 1300  Location: Toe (Comment  which one)  Location Orientation: Anterior;Right  Staging: Deep Tissue Pressure Injury - Purple or maroon localized area of discolored intact skin or blood-filled blister due to damage of underlying soft tissue from pressure and/or shear.  Wound Description (Comments): small area on right great toe metatarsal  Present on Admission: Yes    Physical Exam: Vital Signs Blood pressure (!) 140/86, pulse 80, temperature 98.2 F (36.8 C), temperature source Oral, resp. rate 15, height 5\' 1"  (1.549 m), weight 44.2 kg, SpO2 99%.  Gen: no distress, normal appearing HEENT: oral mucosa pink and moist, NCAT Cardio: Reg rate Chest: normal effort, normal rate of breathing Abd: soft, non-distended Ext: no edema Psych: pleasant, normal affect Skin: intact Psych: nonverbal  Skin:  + Scaling dried skin on head - stable, stage 2 to right toe, area of  redness between left thumb and index finger-covered in dressing, left buttock unstageable  Neurologic: Does not respond to orientation questions, does not track, does not follow cues. Does not follow verbal commands  Tone: MAS 3 right elbow, B knee flexors, and right hip; MAS 3 L elbow , MS 3-4 in wrist and finger flexors  Motor 0/5 strength in LUE and BLE, some purposeful movement in RUE scratches nose but cannot follow commands to cooperate with MMT   Cerebellar exam  unable to assess  Sensation withdraws to pinch BLE and Right index finger Musculoskeletal: reduced bilateral shoulder and elbow ROM due to increased tone  RIght Elbow brace in place Strong neck flexion Right wrist swelling   Assessment/Plan: 1. Functional deficits which require 3+ hours per day of interdisciplinary therapy in a comprehensive inpatient rehab setting. Physiatrist is providing close team supervision and 24 hour management of active medical problems listed below. Physiatrist and rehab team continue to assess barriers to discharge/monitor patient progress toward functional and medical goals  Care Tool:  Bathing    Body parts bathed by patient: Face, Chest, Abdomen   Body parts bathed by helper: Right arm, Left arm, Chest, Abdomen, Front perineal area, Buttocks, Right upper leg, Left upper leg, Right lower leg, Left lower leg, Face Body parts n/a: Right arm, Left arm, Front perineal area, Buttocks, Right upper leg, Left upper leg, Right lower leg, Left lower leg   Bathing assist Assist Level: Maximal Assistance - Patient 24 - 49%     Upper Body Dressing/Undressing Upper body dressing   What is the patient wearing?: Pull over shirt    Upper body assist Assist Level: Total Assistance - Patient < 25%    Lower Body Dressing/Undressing Lower body dressing      What is the patient wearing?: Pants, Incontinence brief     Lower body assist Assist for lower body dressing: Dependent - Patient 0%     Toileting Toileting Toileting Activity did not occur (Clothing management and hygiene only): N/A (no void or bm)  Toileting assist Assist for toileting: Dependent - Patient 0%     Transfers Chair/bed transfer  Transfers assist  Chair/bed transfer activity did not occur: Safety/medical concerns  Chair/bed transfer assist level: Total Assistance - Patient < 25%     Locomotion Ambulation   Ambulation assist   Ambulation activity did not occur: Safety/medical  concerns          Walk 10 feet activity   Assist  Walk 10 feet activity did not occur: Safety/medical concerns        Walk 50 feet activity   Assist Walk 50 feet with 2 turns activity did not occur: Safety/medical concerns         Walk 150 feet activity   Assist Walk 150 feet activity did not occur: Safety/medical concerns         Walk 10 feet on uneven surface  activity   Assist Walk 10 feet on uneven surfaces activity did not occur: Safety/medical concerns         Wheelchair     Assist Is the patient using a wheelchair?: Yes Type of Wheelchair: Manual (tilt in space)    Wheelchair assist level: Dependent - Patient 0%      Wheelchair 50 feet with 2 turns activity    Assist    Wheelchair 50 feet with 2 turns activity did not occur: Safety/medical concerns   Assist Level: Dependent - Patient 0%   Wheelchair 150 feet activity  Assist  Wheelchair 150 feet activity did not occur: Safety/medical concerns   Assist Level: Dependent - Patient 0%   Blood pressure (!) 140/86, pulse 80, temperature 98.2 F (36.8 C), temperature source Oral, resp. rate 15, height 5\' 1"  (1.549 m), weight 44.2 kg, SpO2 99%.  Medical Problem List and Plan: 1. 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  Impaired mobility: continue Lovenox, weekly creatinine ordered             -antiplatelet therapy: N/A 3. Pain Management: Tylenol as needed  4. History of anxiety: discussed with her mom that she feels this was a big risk factor for her accident  5. Neuropsych/cognition: This patient is not capable of making decisions on her own behalf. 6. Left buttock unstageable ulcer.  continue Medihoney to buttock wound daily cover with foam dressing.  Changing dressing every 3 days or as needed soiling   7. Fluids/Electrolytes/Nutrition: Routine in and outs with follow-up chemistries  -Eating 100% most meals with total assist 8.  Cavitary right lower lobe pneumonia likely aspiration pneumonia/MRSA pneumonia.  Resolved    Latest Ref Rng & Units 12/20/2022   10:54 AM 12/11/2022    7:15 AM 12/03/2022    7:18 AM  CBC  WBC 4.0 - 10.5 K/uL 5.6  9.0  8.7   Hemoglobin 12.0 - 15.0 g/dL 16.1  09.6  04.5   Hematocrit 36.0 - 46.0 % 42.8  45.1  39.7   Platelets 150 - 400 K/uL 253  329  218      9.  History of drug abuse.  Positive cocaine on urine drug screen.  Provide counseling  10.  AKI/hypovolemia and ATN.  resolved    Latest Ref Rng & Units 01/05/2023    6:57 AM 12/29/2022    5:46 AM 12/22/2022   12:16 PM  BMP  Creatinine 0.44 - 1.00 mg/dL 4.09  8.11  9.14     11.  Mild transaminitis with rhabdomyolysis.  Both resolved  12.  Hypotension. Resolved, d/c midodrine  13. Impaired initiation: continue amantadine back to 100mg  BID  14. Suboptimal vitamin D: continue 1,000U D3 daily  15. Spasticity:   -discussed serial casting with OT and her mom  -increased baclofen to 20mg  TID and tizanidine to 4mg  BID. Continue wrist, elbow brace, and PRAFOs. Add tizanidine 2mg  HS  8/10- suggest low dose Dantrolene or in the long run, a Baclofen pump to be  assessed in clinic? 16. Bowel and bladder incontinence: continue bowel and bladder program  -LBM 8/5   8/10- LBM x3 in last 12 hours- small- hard--will add Colace 100 mg daily  8/11- Medium BM today- incontinent- better/less hard than yesterday with Colace  17. Left hand skin maceration: Eucerin ordered, discussed hand split with OT, conitnue  18. Tachycardia: mild,~90bpm decreased amantadine back to 100mg  BID, continue this doseHR stable in 80s Q8/10- BP a little elvated- will con't to monitor for trend    01/05/2023    4:37 AM 01/04/2023    7:07 PM 01/04/2023    2:25 PM  Vitals with BMI  Systolic 140 131 409  Diastolic 86 97 98  Pulse 80 94 95    19. History of elevated SBP: Increased tizanidine to 4mg  BID, discussed that blood pressure is controlled  20. Area of redness between left thumb and index finger: continue foam dressing  21. Scalp eczema: selsun blue ordered, continue  22. Fatigue: improved, discussed with team may be secondary to anti-spasticity medications, B12 reviewed and is 335, in suboptimal range, will give 1,000U B12 injection 8/7, will order monthly as needed, metanx started, increase to BID, continue  23. Right wrist swelling: XR ordered and shows no acute fracture, shows 2.8mm ulnar variance, brace removed to given patient a break from it. Voltaren gel ordered prn for discomfort, continue, CT ordered and show no acute osseus injury  23. Dysphagia: continue D3/thins, d/c Ensure as she is eating meals well  24. MRSA nares positive: negative on repeat, precautions d/ced    LOS: 48 days A FACE TO FACE EVALUATION WAS PERFORMED  Drema Pry Arty Lantzy 01/05/2023, 11:33 AM

## 2023-01-05 NOTE — Progress Notes (Signed)
Occupational Therapy Session Note  Patient Details  Name: Nicole [name] MRN: [id number] Date of Birth: Sep 14, 1975  Today's Date: 01/05/2023 OT Individual Time: 1103-1200 OT Individual Time Calculation (min): 57 min    Short Term Goals: Week 7:  OT Short Term Goal 1 (Week 7): Patient will sit unsupported at edge of bed x 25 seconds in preparation for level surface transfer OT Short Term Goal 2 (Week 7): Patient will demonstrate wrist extension to neutral in bilateral wrists without indication of pain OT Short Term Goal 3 (Week 7): Patient will change orientation of toothbrush in mouth to clean left and right sides of teeth with mod cueing following hand over hand to initiate  Skilled Therapeutic Interventions/Progress Updates:    Patient received seated in wheelchair and transported to dayroom.  Worked in sitting with forearms resting on padded table - working to improve head alignment by reducing overactive neck muscles - patient pulls into forward head position.   Worked on assisting patient with trunk shortening on left lateral flexion of head toward left.  Patient assisted to pick up and turn over playing cards.  Attempted matching cards without success.  Hand over hand initially to make contact with card, and mod assist initially for patient to release card.  Patient transitioned to supine for stretching to head/neck/ torso, legs, and BUE. Patient sustaining eye contact and smiling in response to smile, and laughing at times appropriately.  Transferred back to wheelchair and reclined.  Provided range of motion to elbows, wrists and hands.  Patient showing signs of extreme discomfort with range to left hand - but with very slow gentle strentch able to achieve ~ 75% digit extension with either wrist flex/ext.  Patient mostly limited in PIP of 3-5.  Left up in wheelchair - safety belt left off as mom in room, and tech coming to give lunch.   Therapy Documentation Precautions:   Precautions Precautions: Fall Precaution Comments: Contact precautions & delayed processing Restrictions Weight Bearing Restrictions: No   Pain:  Pain with range of motion to hands as evidenced by grimace     Therapy/Group: Individual Therapy  [name] 01/05/2023, 1:18 PM

## 2023-01-05 NOTE — Progress Notes (Signed)
Patient ID: Nicole [name], female   DOB: 01-08-1976, 47 y.o.   MRN: [id number]  [facility] with [facility] has submitted the SSD for pt and now just need to wait for approval. Once this is approved her long term Medicaid can be approved and they can transfer pt to [facility]. Will touch base with [facility] to see how ling they will hold the bed there.

## 2023-01-05 NOTE — Progress Notes (Signed)
Speech Language Pathology Daily Session Note  Patient Details  Name: Nicole [name] MRN: [id number] Date of Birth: Jun 02, 1975  Today's Date: 01/05/2023 SLP Individual Time: 1430-1515 SLP Individual Time Calculation (min): 45 min  Short Term Goals: Week 7: SLP Short Term Goal 1 (Week 7): Pt will present w/ appropriate response to functional stimuli (i.e. accept spoon, grasp basic item appropriately, response to tactile/auditory stimuli) 15% of the time w/ maxA multimodal cues SLP Short Term Goal 2 (Week 7): Patient will follow 1-step commands within a context in 10% of opportunities with Max A multimodal cues. SLP Short Term Goal 3 (Week 7): Patient will utilize multimodal communication to answer basic yes/no questions with maxA in 5% of opportunities  Skilled Therapeutic Interventions:   Pt greeted in her room. She was awake/alert in her TIS chair upon SLP arrival. SLP facilitated a variety of tasks targeting cognition/expression. Pt noted to maintain eye contact/track SLP on and off for <30 mins of the tx session. Pt noted to laugh at SLP's comment x1, though no vocalization noted w/ suspected laughter. Given additional time to respond, pt accurately answered biographical Y/N questions via head nod during 4/6 attempts. Success increased to 6/6 attempts w/ SLP demo and maxA verbal cues. Also completed task following functional directions (facial movements). Groping noted during 4/5 trials and pt was able to successfully complete 2/5 (smile and eye brow raise). Pt challenged w/ automatic speech tasks/humming in attempts to elicit vocalizations. Unable to vocalize, though laryngeal and facial tension noted during task. Suspect 2* pt attempt to vocalize. At the end of the tx tasks, she was left in her chair w/ the chair alarm placed. Call light within reach and nursing notified of completion of therapy for the day. Recommend cont ST per POC.   Pain  Unable to report pain - itching noted, though suspect  unrelated to indication of pain  Therapy/Group: Individual Therapy  [name] 01/05/2023, 5:31 PM

## 2023-01-05 NOTE — Progress Notes (Signed)
Physical Therapy Session Note  Patient Details  Name: Nicole [name] MRN: [id number] Date of Birth: 1976/05/04  Today's Date: 01/05/2023 PT Individual Time: 1305-1330 PT Individual Time Calculation (min): 25 min   Short Term Goals: Week 6:  PT Short Term Goal 1 (Week 6): Pt will initiate bed mobility requiring no more than totalA. PT Short Term Goal 2 (Week 6): Pt will maintain sitting balance unsupported for up to 45 seconds PT Short Term Goal 3 (Week 6): Pt will demonstrate command follow for >10% of tasks. PT Short Term Goal 4 (Week 6): [name] RLE will demonstrate improved joint mobility into knee/ hip extension and ankle DF to neutral.  Skilled Therapeutic Interventions/Progress Updates:     Pt received seated in WC and shakes head "no" when asked about pain. WC transport to gym. Pt performs squat pivot transfer from WC to mat with totalA and max cues for initiation, sequencing, and anterior weight shifting. Session emphasizes sitting balance on edge of mat. Initially pt sits with CGA/minA and cues for midline orientation, but with fatigue and pt becoming distracted, assistance required increases to modA overall for majority of time on mat. PT provides verbal and tactile cues for attention task and attempting to gauge pt's engagement with therapy. Pt takes rest break in semi reclined position and PT provides manual stretching of bilateral hamstrings and glutes. Return to siting with totalA. Stand pivot back to [facility] with maxA and same cues. Left seated in WC with alarm intact and all needs within reach.    Therapy Documentation Precautions:  Precautions Precautions: Fall Precaution Comments: Contact precautions & delayed processing Restrictions Weight Bearing Restrictions: No    Therapy/Group: Individual Therapy  Beau Fanny, PT, DPT 01/05/2023, 4:31 PM

## 2023-01-05 NOTE — Progress Notes (Signed)
Physical Therapy Weekly Progress Note  Patient Details  Name: Nicole [name] MRN: [id number] Date of Birth: 08-May-1976  Beginning of progress report period: {Time; dates multiple:304500300} End of progress report period: January 08, 2023  {CHL IP REHAB PT TIME CALCULATION:304800500}  Patient has met {number 1-5:22450} of {number 1-5:20334} short term goals.  ***  Patient continues to demonstrate the following deficits {impairments:3041632} and therefore will continue to benefit from skilled PT intervention to increase functional independence with mobility.  Patient {LTG progression:3041653}.  {plan of WUJW:1191478}  PT Short Term Goals {GNF:6213086}  Skilled Therapeutic Interventions/Progress Updates:      Therapy Documentation Precautions:  Precautions Precautions: Fall Precaution Comments: Contact precautions & delayed processing Restrictions Weight Bearing Restrictions: No General:   Vital Signs: Therapy Vitals Temp: 98.2 F (36.8 C) Temp Source: Oral Pulse Rate: 80 Resp: 15 BP: (!) 140/86 Patient Position (if appropriate): Lying Oxygen Therapy SpO2: 99 % O2 Device: Room Air Pain:   Vision/Perception     Mobility:   Locomotion :    Trunk/Postural Assessment :    Balance:   Exercises:   Other Treatments:     Therapy/Group: {Therapy/Group:3049007}  Loel Dubonnet 01/05/2023, 7:48 AM

## 2023-01-06 DIAGNOSIS — R252 Cramp and spasm: Secondary | ICD-10-CM

## 2023-01-06 DIAGNOSIS — R000 Tachycardia, unspecified: Secondary | ICD-10-CM

## 2023-01-06 NOTE — Progress Notes (Signed)
PROGRESS NOTE   Subjective/Complaints: Pt with problems sleeping last night per nurse. No other issues reported.  ROS: Limited due to cognitive/behavioral   Objective:   No results found. No results for input(s): "WBC", "HGB", "HCT", "PLT" in the last 72 hours.   Recent Labs    01/05/23 0657  CREATININE 0.56     Intake/Output Summary (Last 24 hours) at 01/06/2023 0934 Last data filed at 01/06/2023 9147 Gross per 24 hour  Intake 598 ml  Output --  Net 598 ml      Pressure Injury 10/31/22 Buttocks Left Unstageable - Full thickness tissue loss in which the base of the injury is covered by slough (yellow, tan, gray, green or brown) and/or eschar (tan, brown or black) in the wound bed. (Active)  10/31/22 2000  Location: Buttocks  Location Orientation: Left  Staging: Unstageable - Full thickness tissue loss in which the base of the injury is covered by slough (yellow, tan, gray, green or brown) and/or eschar (tan, brown or black) in the wound bed.  Wound Description (Comments):   Present on Admission: Yes     Pressure Injury 11/18/22 Toe (Comment  which one) Anterior;Right Deep Tissue Pressure Injury - Purple or maroon localized area of discolored intact skin or blood-filled blister due to damage of underlying soft tissue from pressure and/or shear. small are (Active)  11/18/22 1300  Location: Toe (Comment  which one)  Location Orientation: Anterior;Right  Staging: Deep Tissue Pressure Injury - Purple or maroon localized area of discolored intact skin or blood-filled blister due to damage of underlying soft tissue from pressure and/or shear.  Wound Description (Comments): small area on right great toe metatarsal  Present on Admission: Yes    Physical Exam: Vital Signs Blood pressure (!) 129/96, pulse 94, temperature 98.7 F (37.1 C), resp. rate 15, height 5\' 1"  (1.549 m), weight 44.2 kg, SpO2 100%.  Constitutional:  No distress . Vital signs reviewed. HEENT: NCAT, EOMI, oral membranes moist Neck: supple Cardiovascular: RRR without murmur. No JVD    Respiratory/Chest: CTA Bilaterally without wheezes or rales. Normal effort    GI/Abdomen: BS +, non-tender, non-distended Ext: no clubbing, cyanosis, or edema Psych: flat, disengaged  Skin:  + Scaling dried skin on head - stable, stage 2 to right toe, area of redness between left thumb and index finger-covered in dressing, left buttock unstageable area not viewed  Neurologic: Does not engage or respond questions or commands Tone: MAS 3 right elbow, B knee flexors, and right hip; MAS 3 L elbow , MS 3-4 in wrist and finger flexors  Motor 0/5 strength in LUE and BLE, some purposeful movement in RUE scratches nose but cannot follow commands to cooperate with MMT   Cerebellar exam unable to assess  Sensation withdraws to pinch BLE and Right index finger Musculoskeletal: reduced bilateral shoulder and elbow ROM due to increased tone  RIght Elbow brace in place Strong neck flexion Right wrist swelling   Assessment/Plan: 1. Functional deficits which require 3+ hours per day of interdisciplinary therapy in a comprehensive inpatient rehab setting. Physiatrist is providing close team supervision and 24 hour management of active medical problems listed below. Physiatrist and rehab team  continue to assess barriers to discharge/monitor patient progress toward functional and medical goals  Care Tool:  Bathing    Body parts bathed by patient: Face, Chest, Abdomen   Body parts bathed by helper: Right arm, Left arm, Chest, Abdomen, Front perineal area, Buttocks, Right upper leg, Left upper leg, Right lower leg, Left lower leg, Face Body parts n/a: Right arm, Left arm, Front perineal area, Buttocks, Right upper leg, Left upper leg, Right lower leg, Left lower leg   Bathing assist Assist Level: Maximal Assistance - Patient 24 - 49%     Upper Body  Dressing/Undressing Upper body dressing   What is the patient wearing?: Pull over shirt    Upper body assist Assist Level: Total Assistance - Patient < 25%    Lower Body Dressing/Undressing Lower body dressing      What is the patient wearing?: Pants, Incontinence brief     Lower body assist Assist for lower body dressing: Dependent - Patient 0%     Toileting Toileting Toileting Activity did not occur (Clothing management and hygiene only): N/A (no void or bm)  Toileting assist Assist for toileting: Dependent - Patient 0%     Transfers Chair/bed transfer  Transfers assist  Chair/bed transfer activity did not occur: Safety/medical concerns  Chair/bed transfer assist level: Total Assistance - Patient < 25%     Locomotion Ambulation   Ambulation assist   Ambulation activity did not occur: Safety/medical concerns          Walk 10 feet activity   Assist  Walk 10 feet activity did not occur: Safety/medical concerns        Walk 50 feet activity   Assist Walk 50 feet with 2 turns activity did not occur: Safety/medical concerns         Walk 150 feet activity   Assist Walk 150 feet activity did not occur: Safety/medical concerns         Walk 10 feet on uneven surface  activity   Assist Walk 10 feet on uneven surfaces activity did not occur: Safety/medical concerns         Wheelchair     Assist Is the patient using a wheelchair?: Yes Type of Wheelchair: Manual (tilt in space)    Wheelchair assist level: Dependent - Patient 0%      Wheelchair 50 feet with 2 turns activity    Assist    Wheelchair 50 feet with 2 turns activity did not occur: Safety/medical concerns   Assist Level: Dependent - Patient 0%   Wheelchair 150 feet activity     Assist  Wheelchair 150 feet activity did not occur: Safety/medical concerns   Assist Level: Dependent - Patient 0%   Blood pressure (!) 129/96, pulse 94, temperature 98.7 F (37.1  C), resp. rate 15, height 5\' 1"  (1.549 m), weight 44.2 kg, SpO2 100%.  Medical Problem List and Plan: 1. 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  Impaired mobility: continue Lovenox, weekly creatinine ordered             -antiplatelet therapy: N/A 3. Pain Management: Tylenol as needed  4. History of anxiety: discussed with her mom that she feels this was a big risk factor for her accident  5. Neuropsych/cognition: This patient is not capable of making decisions on her own behalf. 6. Left buttock unstageable ulcer.  continue Medihoney to buttock wound daily cover with foam dressing.  Changing dressing every 3 days or as needed soiling   7. Fluids/Electrolytes/Nutrition: Routine in and outs with follow-up chemistries  -Eating 100% most meals with total assist 8.  Cavitary right lower lobe pneumonia likely aspiration pneumonia/MRSA pneumonia.  Resolved    Latest Ref Rng & Units 12/20/2022   10:54 AM 12/11/2022    7:15 AM 12/03/2022    7:18 AM  CBC  WBC 4.0 - 10.5 K/uL 5.6  9.0  8.7   Hemoglobin 12.0 - 15.0 g/dL 46.9  62.9  52.8   Hematocrit 36.0 - 46.0 % 42.8  45.1  39.7   Platelets 150 - 400 K/uL 253  329   218      9.  History of drug abuse.  Positive cocaine on urine drug screen.  Provide counseling  10.  AKI/hypovolemia and ATN.  resolved    Latest Ref Rng & Units 01/05/2023    6:57 AM 12/29/2022    5:46 AM 12/22/2022   12:16 PM  BMP  Creatinine 0.44 - 1.00 mg/dL 4.13  2.44  0.10     11.  Mild transaminitis with rhabdomyolysis.  Both resolved  12.  Hypotension. Resolved, d/c midodrine  13. Impaired initiation: continue amantadine 100mg  BID  14. Suboptimal vitamin D: continue 1,000U D3 daily  15. Spasticity:   -discussed serial casting with OT and her mom  -increased baclofen to 20mg  TID and tizanidine to 4mg  BID. Continue wrist, elbow brace, and PRAFOs. Add tizanidine 2mg  HS  8/10- suggest low dose Dantrolene or in the long run, a Baclofen pump to be assessed in clinic?  8/24 currently on baclofen 20mg  tid and tizanidine 4mg  bid 16. Bowel and bladder incontinence: continue bowel and bladder program  -LBM 8/5   8/10- LBM x3 in last 12 hours- small- hard--will add Colace 100 mg daily  8/11- Medium BM today- incontinent- better/less hard than yesterday with Colace  17. Left hand skin maceration: Eucerin ordered, discussed hand split with OT, conitnue  18. Tachycardia: mild,~90bpm decreased amantadine back to 100mg  BID, continue this doseHR stable in 80s Q8/10- BP a little elvated- will con't to monitor for trend    01/06/2023    5:28 AM 01/06/2023    4:42 AM 01/06/2023    2:00 AM  Vitals with BMI  Systolic 129 104 272  Diastolic 96 79 80  Pulse 94 83     19. History of elevated SBP: Increased tizanidine to 4mg  BID, discussed that blood pressure is controlled  20. Area of redness between left thumb and index finger: continue foam dressing  21. Scalp eczema: selsun blue ordered, continue  22. Fatigue: improved, discussed with team may be secondary to anti-spasticity medications, B12 reviewed and is 335,  in suboptimal range, will give 1,000U B12 injection 8/7, will order  monthly as needed, metanx started, increase to BID, continue  23. Right wrist swelling: XR ordered and shows no acute fracture, shows 2.34mm ulnar variance, brace removed to given patient a break from it. Voltaren gel ordered prn for discomfort, continue, CT ordered and show no acute osseus injury  23. Dysphagia: continue D3/thins, d/c Ensure as she is eating meals well  24. MRSA nares positive: negative on repeat, precautions d/ced    LOS: 49 days A FACE TO FACE EVALUATION WAS PERFORMED  Ranelle Oyster 01/06/2023, 9:34 AM

## 2023-01-06 NOTE — Progress Notes (Addendum)
Continues to have difficulty falling asleep. Difficulty the night prior. Earlier in the evening scratching at the top of her head with mitten on. Area on top of scalp is red was washed and cleaned. Hair brushed/combed. Later in the evening Nurse Tech giving patient a bath.  Throughout the evening patient demonstrating involuntary movements with right upper extremity myoclonus movements. Also, right arm elbow is bent at 90 degree angle. Episodes be intermittent observed resting at times as well through the evening.  Upper and lower extremities demonstrating rigidity this evening. Difficulty extending lower extremities out. Pillow put between legs due to her rolling to the right in bed.  No involuntary movement observed with patient's head this evening, but does continue to keep head slightly off of the pillow and suspended at times throughout the evening. Repositioned and bed flat.  Nicole [name] moved close to patient to aide in keeping them cool. Earlier diaphoretic.  No nystagmus observed. Involuntary movements intermittently with her jaw and tongue observed.

## 2023-01-07 NOTE — Progress Notes (Signed)
Speech Language Pathology Daily Session Note  Patient Details  Name: Nicole [name] MRN: [id number] Date of Birth: Jul 08, 1975  Today's Date: 01/07/2023 SLP Individual Time: 0805-0900 SLP Individual Time Calculation (min): 55 min  Short Term Goals: Week 7: SLP Short Term Goal 1 (Week 7): Pt will present w/ appropriate response to functional stimuli (i.e. accept spoon, grasp basic item appropriately, response to tactile/auditory stimuli) 15% of the time w/ maxA multimodal cues SLP Short Term Goal 2 (Week 7): Patient will follow 1-step commands within a context in 10% of opportunities with Max A multimodal cues. SLP Short Term Goal 3 (Week 7): Patient will utilize multimodal communication to answer basic yes/no questions with maxA in 5% of opportunities  Skilled Therapeutic Interventions:  Pt was seen for skilled ST targeting cognitive goals.  Upon arrival pt was awake and alert with nursing at bedside.  Pt followed 1 step commands in a functional, familiar context when taking morning medications with max cues.   Pt also required max to total cues for basic self care tasks such as washing her face, brushing her hair, and brushing her teeth due to decreased task initiation and organization.  Pt presented with minimal tracking when trialing use of a simple eye gaze system between 2 choices (yes/no) to facilitate functional multimodal communication of needs and wants, despite max multimodal cues.  She did nod her head "yes" in response to a question posed in a functional context with increased time for processing.   No vocalizations were appreciated on this date.  Pt was left in bed with all needs within reach and safety mitt replaced.  Continue per current plan of care.    Pain Pain Assessment Pain Scale: Faces Faces Pain Scale: No hurt  Therapy/Group: Individual Therapy  Ryian Lynde, Melanee Spry 01/07/2023, 12:39 PM

## 2023-01-07 NOTE — Progress Notes (Signed)
PROGRESS NOTE   Subjective/Complaints: No new problems overnight. Pt appears comfortable.   ROS: Limited due to cognitive/behavioral   Objective:   No results found. No results for input(s): "WBC", "HGB", "HCT", "PLT" in the last 72 hours.   Recent Labs    01/05/23 0657  CREATININE 0.56     Intake/Output Summary (Last 24 hours) at 01/07/2023 1610 Last data filed at 01/07/2023 0755 Gross per 24 hour  Intake 1188 ml  Output --  Net 1188 ml      Pressure Injury 10/31/22 Buttocks Left Unstageable - Full thickness tissue loss in which the base of the injury is covered by slough (yellow, tan, gray, green or brown) and/or eschar (tan, brown or black) in the wound bed. (Active)  10/31/22 2000  Location: Buttocks  Location Orientation: Left  Staging: Unstageable - Full thickness tissue loss in which the base of the injury is covered by slough (yellow, tan, gray, green or brown) and/or eschar (tan, brown or black) in the wound bed.  Wound Description (Comments):   Present on Admission: Yes     Pressure Injury 11/18/22 Toe (Comment  which one) Anterior;Right Deep Tissue Pressure Injury - Purple or maroon localized area of discolored intact skin or blood-filled blister due to damage of underlying soft tissue from pressure and/or shear. small are (Active)  11/18/22 1300  Location: Toe (Comment  which one)  Location Orientation: Anterior;Right  Staging: Deep Tissue Pressure Injury - Purple or maroon localized area of discolored intact skin or blood-filled blister due to damage of underlying soft tissue from pressure and/or shear.  Wound Description (Comments): small area on right great toe metatarsal  Present on Admission: Yes    Physical Exam: Vital Signs Blood pressure (!) 142/82, pulse 80, temperature 98.2 F (36.8 C), resp. rate 18, height 5\' 1"  (1.549 m), weight 44.2 kg, SpO2 100%.  Constitutional: No distress . Vital  signs reviewed. HEENT: NCAT, EOMI, oral membranes moist Neck: supple Cardiovascular: RRR without murmur. No JVD    Respiratory/Chest: CTA Bilaterally without wheezes or rales. Normal effort    GI/Abdomen: BS +, non-tender, non-distended Ext: no clubbing, cyanosis, or edema Psych: flat Skin:  + Scaling dried skin on head present. stage 2 to right toe, area of redness between left thumb and index finger-covered in dressing, left buttock unstageable area not viewed  Neurologic: Minimal engagement on exam. Tone: MAS 3 right elbow, B knee flexors, and right hip; MAS 3 L elbow , MS 3-4 in wrist and finger flexors  Motor 0/5 strength in LUE and BLE, some purposeful movement in RUE scratches nose but cannot follow commands to cooperate with MMT   Cerebellar exam unable to assess  Sensation withdraws to pinch BLE and Right index finger  Musculoskeletal: reduced bilateral shoulder and elbow ROM due to increased tone  RIght Elbow brace in place Right wrist swelling   Assessment/Plan: 1. Functional deficits which require 3+ hours per day of interdisciplinary therapy in a comprehensive inpatient rehab setting. Physiatrist is providing close team supervision and 24 hour management of active medical problems listed below. Physiatrist and rehab team continue to assess barriers to discharge/monitor patient progress toward functional and medical goals  Care Tool:  Bathing    Body parts bathed by patient: Face, Chest, Abdomen   Body parts bathed by helper: Right arm, Left arm, Chest, Abdomen, Front perineal area, Buttocks, Right upper leg, Left upper leg, Right lower leg, Left lower leg, Face Body parts n/a: Right arm, Left arm, Front perineal area, Buttocks, Right upper leg, Left upper leg, Right lower leg, Left lower leg   Bathing assist Assist Level: Maximal Assistance - Patient 24 - 49%     Upper Body Dressing/Undressing Upper body dressing   What is the patient wearing?: Pull over shirt     Upper body assist Assist Level: Total Assistance - Patient < 25%    Lower Body Dressing/Undressing Lower body dressing      What is the patient wearing?: Pants, Incontinence brief     Lower body assist Assist for lower body dressing: Dependent - Patient 0%     Toileting Toileting Toileting Activity did not occur (Clothing management and hygiene only): N/A (no void or bm)  Toileting assist Assist for toileting: Dependent - Patient 0%     Transfers Chair/bed transfer  Transfers assist  Chair/bed transfer activity did not occur: Safety/medical concerns  Chair/bed transfer assist level: Total Assistance - Patient < 25%     Locomotion Ambulation   Ambulation assist   Ambulation activity did not occur: Safety/medical concerns          Walk 10 feet activity   Assist  Walk 10 feet activity did not occur: Safety/medical concerns        Walk 50 feet activity   Assist Walk 50 feet with 2 turns activity did not occur: Safety/medical concerns         Walk 150 feet activity   Assist Walk 150 feet activity did not occur: Safety/medical concerns         Walk 10 feet on uneven surface  activity   Assist Walk 10 feet on uneven surfaces activity did not occur: Safety/medical concerns         Wheelchair     Assist Is the patient using a wheelchair?: Yes Type of Wheelchair: Manual (tilt in space)    Wheelchair assist level: Dependent - Patient 0%      Wheelchair 50 feet with 2 turns activity    Assist    Wheelchair 50 feet with 2 turns activity did not occur: Safety/medical concerns   Assist Level: Dependent - Patient 0%   Wheelchair 150 feet activity     Assist  Wheelchair 150 feet activity did not occur: Safety/medical concerns   Assist Level: Dependent - Patient 0%   Blood pressure (!) 142/82, pulse 80, temperature 98.2 F (36.8 C), resp. rate 18, height 5\' 1"  (1.549 m), weight 44.2 kg, SpO2 100%.  Medical Problem List  and Plan: 1. 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  Impaired mobility: continue Lovenox, weekly creatinine ordered             -antiplatelet therapy: N/A 3. Pain Management: Tylenol as needed  4. History of anxiety: discussed with her mom that she feels this was a big risk factor for her accident  5. Neuropsych/cognition: This patient is not capable of making decisions on her own behalf. 6. Left buttock unstageable ulcer.  continue Medihoney to buttock wound daily cover with foam dressing.  Changing dressing every 3 days or as needed soiling   7. Fluids/Electrolytes/Nutrition: Routine in and outs with follow-up chemistries  -Eating 100% most meals with total assist 8.  Cavitary right lower lobe pneumonia likely aspiration pneumonia/MRSA pneumonia.  Resolved    Latest Ref Rng & Units 12/20/2022   10:54 AM 12/11/2022    7:15 AM 12/03/2022    7:18 AM  CBC  WBC 4.0 - 10.5 K/uL 5.6  9.0  8.7   Hemoglobin 12.0 - 15.0 g/dL 16.1  09.6  04.5   Hematocrit 36.0 - 46.0 % 42.8  45.1  39.7   Platelets 150 - 400 K/uL 253  329  218      9.  History of drug abuse.  Positive cocaine  on urine drug screen.  Provide counseling  10.  AKI/hypovolemia and ATN.  resolved    Latest Ref Rng & Units 01/05/2023    6:57 AM 12/29/2022    5:46 AM 12/22/2022   12:16 PM  BMP  Creatinine 0.44 - 1.00 mg/dL 4.09  8.11  9.14     11.  Mild transaminitis with rhabdomyolysis.  Both resolved  12.  Hypotension. Resolved, d/c midodrine  13. Impaired initiation: continue amantadine 100mg  BID  14. Suboptimal vitamin D: continue 1,000U D3 daily  15. Spasticity:   -discussed serial casting with OT and her mom  -increased baclofen to 20mg  TID and tizanidine to 4mg  BID. Continue wrist, elbow brace, and PRAFOs. Add tizanidine 2mg  HS  8/10- suggest low dose Dantrolene or in the long run, a Baclofen pump to be assessed in clinic?  8/24 currently on baclofen 20mg  tid and tizanidine 4mg  bid 16. Bowel and bladder incontinence: continue bowel and bladder program  -LBM 8/24      17. Left hand skin maceration: Eucerin ordered, discussed hand split with OT, conitnue  18. Tachycardia: mild,~90bpm decreased amantadine back to 100mg  BID, continue this doseHR stable in 80s Q8/10- BP a little elvated- will con't to monitor for trend    01/07/2023    6:15 AM 01/06/2023    8:12 PM 01/06/2023    1:07 PM  Vitals with BMI  Systolic 142 130 782  Diastolic 82 89 94  Pulse 80 91 92    19. History of elevated SBP: Increased tizanidine to 4mg  BID, discussed that blood pressure is controlled  20. Area of redness between left thumb and index finger: continue foam dressing  21. Scalp eczema: selsun blue ordered, continue  22. Fatigue: improved, discussed with team may be secondary to anti-spasticity medications, B12 reviewed and is 335, in suboptimal range, will give 1,000U B12 injection 8/7, will order monthly as needed, metanx started, increase to BID, continue  23. Right wrist swelling: XR ordered and shows no acute fracture, shows 2.30mm  ulnar variance, brace removed to given patient a break from it.  Voltaren gel ordered prn for discomfort, continue, CT ordered and show no acute osseus injury  23. Dysphagia: continue D3/thins, d/c Ensure as she is eating meals well  24. MRSA nares positive: negative on repeat, precautions d/ced    LOS: 50 days A FACE TO FACE EVALUATION WAS PERFORMED  Ranelle Oyster 01/07/2023, 8:29 AM

## 2023-01-08 MED ORDER — TIZANIDINE HCL 4 MG PO TABS
4.0000 mg | ORAL_TABLET | Freq: Three times a day (TID) | ORAL | Status: DC
  Administered 2023-01-08 – 2023-01-15 (×21): 4 mg via ORAL
  Filled 2023-01-08 (×23): qty 1

## 2023-01-08 NOTE — Progress Notes (Signed)
Speech Language Pathology Daily Session Note  Patient Details  Name: Nicole [name] MRN: [id number] Date of Birth: 1975/07/29  Today's Date: 01/08/2023 SLP Individual Time: 1430-1515 SLP Individual Time Calculation (min): 45 min  Short Term Goals: Week 7: SLP Short Term Goal 1 (Week 7): Pt will present w/ appropriate response to functional stimuli (i.e. accept spoon, grasp basic item appropriately, response to tactile/auditory stimuli) 15% of the time w/ maxA multimodal cues SLP Short Term Goal 2 (Week 7): Patient will follow 1-step commands within a context in 10% of opportunities with Max A multimodal cues. SLP Short Term Goal 3 (Week 7): Patient will utilize multimodal communication to answer basic yes/no questions with maxA in 5% of opportunities  Skilled Therapeutic Interventions:   Pt and mom greeted at bedside. She was awake/alert in her TIS wheelchair upon SLP arrival. Pt notably wet and urine obvious on the floor. SLP and pt's nurse assisted pt back to bed via squat pivot transfer (+2) to better assist w/ peri care and brief/clothing change d/t incontinent bladder episode. Peri care and brief/clothing change prolonged 2* additional incontinent bladder and bowel episode. Throughout pt care, she was able to answer simple Y/N questions re wants/needs via head nod 50% of the time INDly and 75% of the time w/ modA visual/verbal cueing. With hand over hand initiation, she was able to follow very functional commands to assist w/ positioning/rolling w/ 50% accuracy. Smile noted x1 and pt was able to consistently track SLP/nurse throughout pt care. She was left in bed with the alarm set. Direct hand off completed with PT as well. Recommend cont ST per POC.   Pain  Unable to report pain. Did not appear distressed.   Therapy/Group: Individual Therapy  [name] 01/08/2023, 4:32 PM

## 2023-01-08 NOTE — Progress Notes (Signed)
Occupational Therapy Session Note  Patient Details  Name: Nicole [name] MRN: [id number] Date of Birth: 05-08-1976  Today's Date: 01/08/2023 OT Individual Time: 6811-5726 OT Individual Time Calculation (min): 59 min    Short Term Goals: Week 7:  OT Short Term Goal 1 (Week 7): Patient will sit unsupported at edge of bed x 25 seconds in preparation for level surface transfer OT Short Term Goal 2 (Week 7): Patient will demonstrate wrist extension to neutral in bilateral wrists without indication of pain OT Short Term Goal 3 (Week 7): Patient will change orientation of toothbrush in mouth to clean left and right sides of teeth with mod cueing following hand over hand to initiate  Skilled Therapeutic Interventions/Progress Updates:   Patient received supine in bed- awake and alert.  Patient assisted to roll left and right to remove wet brief.  Patient assisted to sitting position and transferred to rolling shower seat.  Patient had incontinent BM once on seat (like toilet seat) Patient placed under warm water and immediately her R hand went to her hair.  Patient given washcloth and instructed to wash face - washed chin and cheeks without further cueing, placed right hand with washcloth over chest and patient made washing motions.  Assisted patient to dry off and dress.  Dressed lower body in bed to utilize rolling, and to provide some range of motion to BLE.  Patient with bony protrusion on left later knee (top of fibula) appears larger then right, and solid.  Not red or warm to touch, but patient very sensitive to passive knee extension.  Assisted patient up to chair to pick out short from choice of two.  Patient made minimal response with this today.    Provided gentle passive range of motion, joint distraction and carpal / metacarpal mobilization to left hand/wrist.  Patient with improved digit extension following manipulation.   Patient left up in wheelchair, tilted significantly for head support.  R  elbow cast, left elbow brace, left hand curled to arm rest of wheelchair, safety belt in place and engaged and needed items nearby.    Therapy Documentation Precautions:  Precautions Precautions: Fall Precaution Comments: Contact precautions & delayed processing Restrictions Weight Bearing Restrictions: No   Pain: Pain Assessment Pain Scale: Faces Faces Pain Scale: No hurt       Therapy/Group: Individual Therapy  [name] 01/08/2023, 12:32 PM
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
## 2023-01-08 NOTE — Progress Notes (Signed)
Physical Therapy Session Note  Patient Details  Name: Nicole [name] MRN: [id number] Date of Birth: April 29, 1976  Today's Date: 01/08/2023 PT Individual Time: 1117-1204 PT Individual Time Calculation (min): 47 min   Short Term Goals: Week 5:  PT Short Term Goal 1 (Week 5): Pt will initiate bed mobility requiring no more than totalA. PT Short Term Goal 1 - Progress (Week 5): Progressing toward goal PT Short Term Goal 2 (Week 5): Pt will maintain sitting balance unsupported for up to 30 seconds PT Short Term Goal 2 - Progress (Week 5): Met PT Short Term Goal 3 (Week 5): Pt will demonstrate command follow for >10% of tasks. PT Short Term Goal 3 - Progress (Week 5): Progressing toward goal PT Short Term Goal 4 (Week 5): [name] RLE will demonstrate improved joint mobility into knee/ hip extension and ankle DF to neutral. PT Short Term Goal 4 - Progress (Week 5): Progressing toward goal Week 6:  PT Short Term Goal 1 (Week 6): Pt will initiate bed mobility requiring no more than totalA. PT Short Term Goal 2 (Week 6): Pt will maintain sitting balance unsupported for up to 45 seconds PT Short Term Goal 3 (Week 6): Pt will demonstrate command follow for >10% of tasks. PT Short Term Goal 4 (Week 6): [name] RLE will demonstrate improved joint mobility into knee/ hip extension and ankle DF to neutral.  Skilled Therapeutic Interventions/Progress Updates:  Patient seated upright in TIS w/c on entrance to room. Patient awake/ alert and demos turn of head turn upon verbal recognition of pt from therapist at door. Improved hld of head in upright position.   Patient with no pain complaint at start of session. Improved focus at beginning of session. Also demos improved tolerance to L hand finger mobilization.   Pt taken outside for change of scenery in attempt to improve focus and attention. Seated at fountain outside of [facility]. Pt does maintain eye gaze to fountain initially. Asked pt, "You haven't been outside in a  while, have you?" And pt responds with "No, no.... no." Continued attempts to have pt continue to  verbalize but head nods and shakes only after this.   Pt is able to follow instructions to open R hand to grasp cup placed in hand by therapist. Is able to grasp but then does not have wrist control to maintain upright position if cup is not resting on surface. Provided with MaxA for rasing of  R arm and flexing of wrist for pt to reach straw to mouth. Holds head up to drink appropriately.   Pt holds therapist's hand and begins to tap R thumb on therapist's ring on finger. Asked pt to tap each finger and she is able to perform as per instructions one time.   Manual Therapy: Pt presents with RLE in more flexed hip/ knee position and increased inversion to RLE, [name] RLE held in static stretch and then mobilized in ROM to end ranges available in w/c for hip flex/ ext, abd/add, IR/ ER, knee extension, ankle DF.   Patient seated upright in TIS w/c at end of session with brakes locked, no belt alarm set d/t positioning and NT arrival with lunch tray. All needs within reach.  Therapy Documentation Precautions:  Precautions Precautions: Fall Precaution Comments: Contact precautions & delayed processing Restrictions Weight Bearing Restrictions: No  Pain: Decreased instances of pain noted in L hand/ finger ROM.    Therapy/Group: Individual Therapy  Loel Dubonnet PT, DPT, CSRS 01/08/2023, 12:36 PM

## 2023-01-09 MED ORDER — MAGNESIUM GLUCONATE 500 MG PO TABS
250.0000 mg | ORAL_TABLET | Freq: Every day | ORAL | Status: DC
  Administered 2023-01-09: 250 mg via ORAL
  Filled 2023-01-09: qty 1

## 2023-01-09 NOTE — Progress Notes (Signed)
Physical Therapy Session Note  Patient Details  Name: KOBIE SHUMAKE MRN: [id number] Date of Birth: [birth year]  {CHL IP REHAB PT TIME CALCULATION:304800500}  Short Term Goals: {WUJ:[id number]}  Skilled Therapeutic Interventions/Progress Updates:      Therapy Documentation Precautions:  Precautions Precautions: Fall Precaution Comments: Contact precautions & delayed processing Restrictions Weight Bearing Restrictions: No General:   Vital Signs: Therapy Vitals Temp: 98.5 F (36.9 C) Pulse Rate: 96 Resp: 17 BP: 135/89 Patient Position (if appropriate): Lying Oxygen Therapy SpO2: 100 % O2 Device: Room Air Pain:   Mobility:   Locomotion :    Trunk/Postural Assessment :    Balance:   Exercises:   Other Treatments:      Therapy/Group: {Therapy/Group:[id number]}  Loel Dubonnet 01/09/2023, 7:48 AM

## 2023-01-09 NOTE — Progress Notes (Signed)
PROGRESS NOTE   Subjective/Complaints: Appears comfortable Patient's chart reviewed- No issues reported overnight Vitals signs stable except for some diastolic hypertension   ROS: Limited due to cognitive/behavioral   Objective:   No results found. No results for input(s): "WBC", "HGB", "HCT", "PLT" in the last 72 hours.   No results for input(s): "NA", "K", "CL", "CO2", "GLUCOSE", "BUN", "CREATININE", "CALCIUM" in the last 72 hours.    Intake/Output Summary (Last 24 hours) at 01/09/2023 0911 Last data filed at 01/09/2023 0831 Gross per 24 hour  Intake 838 ml  Output --  Net 838 ml      Pressure Injury 10/31/22 Buttocks Left Unstageable - Full thickness tissue loss in which the base of the injury is covered by slough (yellow, tan, gray, green or brown) and/or eschar (tan, brown or black) in the wound bed. (Active)  10/31/22 2000  Location: Buttocks  Location Orientation: Left  Staging: Unstageable - Full thickness tissue loss in which the base of the injury is covered by slough (yellow, tan, gray, green or brown) and/or eschar (tan, brown or black) in the wound bed.  Wound Description (Comments):   Present on Admission: Yes     Pressure Injury 11/18/22 Toe (Comment  which one) Anterior;Right Deep Tissue Pressure Injury - Purple or maroon localized area of discolored intact skin or blood-filled blister due to damage of underlying soft tissue from pressure and/or shear. small are (Active)  11/18/22 1300  Location: Toe (Comment  which one)  Location Orientation: Anterior;Right  Staging: Deep Tissue Pressure Injury - Purple or maroon localized area of discolored intact skin or blood-filled blister due to damage of underlying soft tissue from pressure and/or shear.  Wound Description (Comments): small area on right great toe metatarsal  Present on Admission: Yes    Physical Exam: Vital Signs Blood pressure 135/89,  pulse 96, temperature 98.5 F (36.9 C), resp. rate 17, height 5\' 1"  (1.549 m), weight 44.2 kg, SpO2 100%.  Gen: no distress, normal appearing HEENT: oral mucosa pink and moist, NCAT Cardio: Reg rate Chest: normal effort, normal rate of breathing Abd: soft, non-distended Ext: no edema Psych: Nonverbal  Skin:  + Scaling dried skin on head present. stage 2 to right toe, area of redness between left thumb and index finger-covered in dressing, left buttock unstageable area not viewed  Neurologic: Minimal engagement on exam. Tone: MAS 3 right elbow, B knee flexors, and right hip; MAS 3 L elbow , MS 3-4 in wrist and finger flexors  Motor 0/5 strength in LUE and BLE, some purposeful movement in RUE scratches nose but cannot follow commands to cooperate with MMT   Cerebellar exam unable to assess  Sensation withdraws to pinch BLE and Right index finger  Musculoskeletal: reduced bilateral shoulder and elbow ROM due to increased tone  RIght Elbow brace in place Right wrist swelling   Assessment/Plan: 1. Functional deficits which require 3+ hours per day of interdisciplinary therapy in a comprehensive inpatient rehab setting. Physiatrist is providing close team supervision and 24 hour management of active medical problems listed below. Physiatrist and rehab team continue to assess barriers to discharge/monitor patient progress toward functional and medical goals  Care Tool:  Bathing    Body parts bathed by patient: Face   Body parts bathed by helper: Right arm, Left arm, Chest, Abdomen, Front perineal area, Buttocks, Right upper leg, Left upper leg, Right lower leg, Left lower leg, Face Body parts n/a: Right arm, Left arm, Front perineal area, Buttocks, Right upper leg, Left upper leg, Right lower leg, Left lower leg   Bathing assist Assist Level: Maximal Assistance - Patient 24 - 49%     Upper Body Dressing/Undressing Upper body dressing   What is the patient wearing?: Pull over  shirt    Upper body assist Assist Level: Total Assistance - Patient < 25%    Lower Body Dressing/Undressing Lower body dressing      What is the patient wearing?: Pants, Incontinence brief     Lower body assist Assist for lower body dressing: Dependent - Patient 0%     Toileting Toileting Toileting Activity did not occur (Clothing management and hygiene only): N/A (no void or bm)  Toileting assist Assist for toileting: Dependent - Patient 0%     Transfers Chair/bed transfer  Transfers assist  Chair/bed transfer activity did not occur: Safety/medical concerns  Chair/bed transfer assist level: Total Assistance - Patient < 25%     Locomotion Ambulation   Ambulation assist   Ambulation activity did not occur: Safety/medical concerns          Walk 10 feet activity   Assist  Walk 10 feet activity did not occur: Safety/medical concerns        Walk 50 feet activity   Assist Walk 50 feet with 2 turns activity did not occur: Safety/medical concerns         Walk 150 feet activity   Assist Walk 150 feet activity did not occur: Safety/medical concerns         Walk 10 feet on uneven surface  activity   Assist Walk 10 feet on uneven surfaces activity did not occur: Safety/medical concerns         Wheelchair     Assist Is the patient using a wheelchair?: Yes Type of Wheelchair: Manual (tilt in space)    Wheelchair assist level: Dependent - Patient 0%      Wheelchair 50 feet with 2 turns activity    Assist    Wheelchair 50 feet with 2 turns activity did not occur: Safety/medical concerns   Assist Level: Dependent - Patient 0%   Wheelchair 150 feet activity     Assist  Wheelchair 150 feet activity did not occur: Safety/medical concerns   Assist Level: Dependent - Patient 0%   Blood pressure 135/89, pulse 96, temperature 98.5 F (36.9 C), resp. rate 17, height 5\' 1"  (1.549 m), weight 44.2 kg, SpO2 100%.  Medical Problem  List and Plan: 1. 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  Impaired mobility: continue Lovenox, weekly creatinine ordered             -antiplatelet therapy: N/A 3. Pain Management: Tylenol as needed  4. History of anxiety: discussed with her mom that she feels this was a big risk factor for her accident  5. Neuropsych/cognition: This patient is not capable of making decisions on her own behalf. 6. Left buttock unstageable ulcer.  continue Medihoney to buttock wound daily cover with foam dressing.  Changing dressing every 3 days or as needed soiling   7. Fluids/Electrolytes/Nutrition: Routine in and outs with follow-up chemistries  -Eating 100% most meals with total assist 8.  Cavitary right lower lobe pneumonia likely aspiration pneumonia/MRSA pneumonia.  Resolved    Latest Ref Rng & Units 12/20/2022   10:54 AM 12/11/2022    7:15 AM 12/03/2022    7:18 AM  CBC  WBC 4.0 - 10.5 K/uL 5.6  9.0  8.7   Hemoglobin 12.0 - 15.0 g/dL 46.9  62.9  52.8   Hematocrit 36.0 - 46.0 % 42.8  45.1  39.7   Platelets 150 - 400 K/uL 253  329  218      9.  History of drug abuse.  Positive  cocaine on urine drug screen.  Provide counseling  10.  AKI/hypovolemia and ATN.  resolved    Latest Ref Rng & Units 01/05/2023    6:57 AM 12/29/2022    5:46 AM 12/22/2022   12:16 PM  BMP  Creatinine 0.44 - 1.00 mg/dL 4.13  2.44  0.10     11.  Mild transaminitis with rhabdomyolysis.  Both resolved  12.  Hypotension. Resolved, d/c midodrine  13. Impaired initiation: continue amantadine 100mg  BID  14. Suboptimal vitamin D: continue 1,000U D3 daily  15. Spasticity:   -discussed serial casting with OT and her mom  -increased baclofen to 20mg  TID and tizanidine to 4mg  BID. Continue wrist, elbow brace, and PRAFOs. Add tizanidine 2mg  HS  8/10- suggest low dose Dantrolene or in the long run, a Baclofen pump to be assessed in clinic?  8/24 currently on baclofen 20mg  tid and tizanidine 4mg  bid  16. Bowel and bladder incontinence: continue bowel and bladder program  -LBM 8/24      17. Left hand skin maceration: Eucerin ordered, discussed hand split with OT, conitnue  18. Tachycardia: mild,~90bpm decreased amantadine back to 100mg  BID, continue this doseHR stable in 80s Q8/10- BP a little elvated- will con't to monitor for trend    01/09/2023    4:28 AM 01/08/2023    7:57 PM 01/08/2023    1:14 PM  Vitals with BMI  Systolic 135 137 272  Diastolic 89 93 94  Pulse 96 107 96    19. History of elevated SBP: Increase tizanidine to 4mg  TID. Magnesium supplement added HS  20. Area of redness between left thumb and index finger: continue foam dressing  21. Scalp eczema: selsun blue ordered, continue  22. Fatigue: improved, discussed with team may be secondary to anti-spasticity medications, B12 reviewed and is 335, in suboptimal range, will give 1,000U B12 injection 8/7, will order monthly as needed, metanx started, increase to BID, continue  23. Right wrist swelling: XR ordered and shows no acute fracture, shows 2.31mm ulnar variance, brace removed to given  patient a break from it. Voltaren  gel ordered prn for discomfort, continue, CT ordered and show no acute osseus injury  23. Dysphagia: continue D3/thins, d/c Ensure as she is eating meals well  24. MRSA nares positive: negative on repeat, precautions d/ced    LOS: 52 days A FACE TO FACE EVALUATION WAS PERFORMED  Nicole [name] 01/09/2023, 9:11 AM

## 2023-01-09 NOTE — Progress Notes (Signed)
Speech Language Pathology Weekly Progress and Session Note  Patient Details  Name: Nicole [name] MRN: [id number] Date of Birth: 1976/05/01  Beginning of progress report period: January 02, 2023 End of progress report period: January 09, 2023  Today's Date: 01/09/2023 SLP Individual Time: 4098-1191 SLP Individual Time Calculation (min): 45 min  Short Term Goals: Week 7: SLP Short Term Goal 1 (Week 7): Pt will present w/ appropriate response to functional stimuli (i.e. accept spoon, grasp basic item appropriately, response to tactile/auditory stimuli) 15% of the time w/ maxA multimodal cues SLP Short Term Goal 1 - Progress (Week 7): Met SLP Short Term Goal 2 (Week 7): Patient will follow 1-step commands within a context in 10% of opportunities with Max A multimodal cues. SLP Short Term Goal 2 - Progress (Week 7): Met SLP Short Term Goal 3 (Week 7): Patient will utilize multimodal communication to answer basic yes/no questions with maxA in 5% of opportunities SLP Short Term Goal 3 - Progress (Week 7): Met    New Short Term Goals: Week 8: SLP Short Term Goal 1 (Week 8): Pt will present w/ appropriate response to functional stimuli (i.e. accept spoon, grasp basic item appropriately, response to tactile/auditory stimuli) 20% of the time w/ maxA multimodal cues SLP Short Term Goal 2 (Week 8): Patient will follow 1-step commands within a context in 20% of opportunities with Max A multimodal cues SLP Short Term Goal 3 (Week 8): Patient will utilize multimodal communication as needed to answer basic yes/no questions with modA in 20% of opportunities  Weekly Progress Updates:  Pt presented w/ good progress this week as she met 3/3 STGs. Progress remains very slow overall 2* profound cognitive-linguistic deficits; however, she demonstrates improved responsiveness to her environment via eye gaze/tracking, notable effort to follow commands, and utilization of head nods to answer Y/N questions. Pt/family  education ongoing. Overall, she continues to vary between [facility] for communication of wants/needs at this time. She would benefit from continued skilled ST services to target cognitive-linguistic deficits, facilitate consistent responses/communication, maximize pt independence, and reduce caregiver burden. She would still benefit from 24/7 care and continued ST services upon d/c as well.    Intensity: Minumum of 1-2 x/day, 30 to 90 minutes Frequency: 1 to 3 out of 7 days Duration/Length of Stay: awaiting SNF placement Treatment/Interventions: Cognitive remediation/compensation;Dysphagia/aspiration precaution training;Internal/external aids;Speech/Language facilitation;Cueing hierarchy;Environmental controls;Therapeutic Activities;Functional tasks;Multimodal communication approach;Patient/family education;Therapeutic Exercise   Daily Session  Skilled Therapeutic Interventions: Pt greeted at bedside. She was awake/alert, reclined in her TIS chair upon SLP arrival. She was seated in a head down position, though raised her head to look at SLP. SLP facilitated simple Y/N questions re wants/needs. With additional time, she was able to initiate responses via head nod ~50% of the time. Slight responses noted, so SLP repeated questions as needed to clarify responses. Attempted following directions task (facial movements), though reduced attention to task and initiation noted today. Groping noted during 1/5 commands only (smile). Unable to voice or hum during automatic speech tasks: verbalizing 1-10, humming happy birthday, humming ABCs, and humming to pt preferred music. Given maxA verbal/visual/tactile cueing, pt was able to nod her head along to the beat of the music and maintain for ~20 seconds. Attempted eye gaze calibration via app. She demonstrated improved visual fixation today, but calibration/control of cursor remained unsuccessful. She was left in her chair with the alarm set. Nursing notified of  pt's completion of therapy for the day. Recommend cont ST per POC.  Pain  Unable to report pain. Appeared comfortable.   Therapy/Group: Individual Therapy  [name] 01/09/2023, 9:04 AM

## 2023-01-09 NOTE — Progress Notes (Signed)
Speech Language Pathology Daily Session Note  Patient Details  Name: Nicole [name] MRN: [id number] Date of Birth: 18-Jan-1976  Today's Date: 01/09/2023 SLP Individual Time: 8657-8469 SLP Individual Time Calculation (min): 53 min  Short Term Goals: Week 8: SLP Short Term Goal 1 (Week 8): Pt will present w/ appropriate response to functional stimuli (i.e. accept spoon, grasp basic item appropriately, response to tactile/auditory stimuli) 20% of the time w/ maxA multimodal cues SLP Short Term Goal 2 (Week 8): Patient will follow 1-step commands within a context in 20% of opportunities with Max A multimodal cues SLP Short Term Goal 3 (Week 8): Patient will utilize multimodal communication as needed to answer basic yes/no questions with modA in 20% of opportunities  Skilled Therapeutic Interventions: SLP conducted skilled therapy session targeting cognitive retraining. Upon entry, patient receiving care from nursing for toileting. During this patient care, patient required total assist to follow 1-step directions from nursing. This date, patient exhibited minimal participation throughout session, continuing to exhibit significant difficulty engaging in therapeutic tasks and finding effective method of communication of wants/needs. SLP attempted to facilitate head/hand movement via incorporation of music unsuccessfully. Throughout session, patient answered 1/5 Y/N questions via head nod indicating interest in musical artist for music selection. SLP attempted to facilitate one-step command following with familiar objects (I.e. hairbrush, spoon, washcloth, etc.) via totalA multimodal cues with patient requiring posturing/hand over hand assist by SLP to complete all tasks. During oral motor tasks, patient followed 1/7 1-step directions given model from SLP and repetition of prompt. SLP attempted to facilitate whiteboard communication with patient requiring hand over hand assist to write name. Patient was left in  lowered bed with call bell in reach and bed alarm set. SLP will continue to target goals per plan of care.       Pain Pain Assessment Pain Scale: Faces Pain Score: 0-No pain Faces Pain Scale: No hurt  Therapy/Group: Individual Therapy  Nicole [name], M.A., CCC-SLP  Nicole [name] 01/09/2023, 11:58 AM

## 2023-01-09 NOTE — Progress Notes (Signed)
Occupational Therapy Session Note  Patient Details  Name: Nicole [name] MRN: [id number] Date of Birth: May 16, 1975  Today's Date: 01/09/2023 OT Individual Time: 4098-1191 OT Individual Time Calculation (min): 42 min    Short Term Goals: Week 7:  OT Short Term Goal 1 (Week 7): Patient will sit unsupported at edge of bed x 25 seconds in preparation for level surface transfer OT Short Term Goal 2 (Week 7): Patient will demonstrate wrist extension to neutral in bilateral wrists without indication of pain OT Short Term Goal 3 (Week 7): Patient will change orientation of toothbrush in mouth to clean left and right sides of teeth with mod cueing following hand over hand to initiate  Skilled Therapeutic Interventions/Progress Updates:  Skilled OT intervention completed with focus on self-care, functional transfers, and PROM. Pt received upright in bed, non-verbal and minimally responsive but visually attended to therapist about 50% of time during session. Pt demonstrated discomfort/anxiety via facial expression when OT assisted with repositioning of Lt hand to prevent tight fisting. Pre-medicated per chart. OT offered repositioning, extra time and distraction throughout for pain reduction.  Doffed bilateral PFRAFO, and threaded pants over BLE total A. Max A to bring BLE into bridging position, with noted heavy urinary void and firm BM incontinence present. Total A for rolling R<>L for pericare and threading of new brief. Unable to initiate bridging despite legs prepped into position therefore utilized assisted roll to donn over hips. Transitioned > Lt sidelying > sitting EOB with total A. Able to sit statically with mod A for balance, with pt appearing to use Rt hand to reach towards opposite arm rest of chair but no activation present. Total A squat pivot > w/c. Pt with extensor tone when seated upright in chair, requiring min A to remain in chair and use of tilt feature to reposition hips functionally in  chair and donn bilateral leg rests for safety. Semi upright in TIS w/c, pt did not initiate doffing gown or donning of shirt, requiring total A, with Rt hand noted to accept the grasping of shirt when placed in hand, but no initiation to finish the task.   With pt tilted in TIS w/c to a lying position, OT completed myofascial release and manual stretching to cervical, occipital, and suboccipital regions to decrease fascial restrictions, increase joint mobility, and promote functional cervical posture for BADL engagement. Palpated max fascial restrictions in noted areas above, with pt noted to have palpable tension release with time.  Pt remained slightly tilted in TIS w/c with head in neutral position on head rest, pt asleep, with belt alarm on/activated, and with all needs in reach at end of session.   Therapy Documentation Precautions:  Precautions Precautions: Fall Precaution Comments: delayed processing Restrictions Weight Bearing Restrictions: No    Therapy/Group: Individual Therapy  Melvyn Novas, MS, OTR/L  01/09/2023, 3:38 PM

## 2023-01-10 MED ORDER — MAGNESIUM GLUCONATE 500 MG PO TABS
500.0000 mg | ORAL_TABLET | Freq: Every day | ORAL | Status: DC
  Administered 2023-01-10 – 2023-05-23 (×133): 500 mg via ORAL
  Filled 2023-01-10 (×133): qty 1

## 2023-01-10 MED ORDER — PROPRANOLOL HCL 20 MG PO TABS
10.0000 mg | ORAL_TABLET | Freq: Every day | ORAL | Status: DC
  Administered 2023-01-10 – 2023-01-11 (×2): 10 mg via ORAL
  Filled 2023-01-10 (×2): qty 1

## 2023-01-10 NOTE — Progress Notes (Signed)
Orthopedic Tech Progress Note Patient Details:  Nicole [name] 1976/05/02 [id number]  Ortho Devices Type of Ortho Device: Knee Immobilizer Ortho Device/Splint Location: rle Ortho Device/Splint Interventions: Application   Post Interventions Patient Tolerated: Well  Genelle Bal Jotham Ahn 01/10/2023, 1:19 PM

## 2023-01-10 NOTE — Progress Notes (Signed)
Occupational Therapy Session Note  Patient Details  Name: Nicole [name] MRN: [id number] Date of Birth: Feb 05, 1976  Today's Date: 01/10/2023 OT Individual Time: 1315-1410 OT Individual Time Calculation (min): 55 min    Short Term Goals: Week 7:  OT Short Term Goal 1 (Week 7): Patient will sit unsupported at edge of bed x 25 seconds in preparation for level surface transfer OT Short Term Goal 2 (Week 7): Patient will demonstrate wrist extension to neutral in bilateral wrists without indication of pain OT Short Term Goal 3 (Week 7): Patient will change orientation of toothbrush in mouth to clean left and right sides of teeth with mod cueing following hand over hand to initiate  Skilled Therapeutic Interventions/Progress Updates:  Pt received resting in bed for skilled OT session with focus on bed positioning and gentle ROM. Pt with grimacing during rolling and BUE ROM. OT offering intermediate rest breaks and positioning suggestions throughout session to address pain/fatigue and maximize participation/safety in session.   Pt rolls towards R-side with Max A for posterior wound evaluation, returning to supine in same fashion. Pt dependent for boosting in bed and BLE postioning with PRAFOs according to orders placed by primary therapist.   RUE mitten and L hand splint removed, of note, L hand splint leaves large indentation on patient hand, RN updated. Gentle stretching provided at BUE to address flexor tone, pt unable to extension past approx. 100 degrees at RUE and approx.90 degrees at LUE, despite extended time provided at end range. Therapist applies moist heat at upper arm for approximately 10 mins each, no real change observed in degrees of extension. Anticipate decrease in ROM is partly due to minimal rapport with patient. Pt with strong reaction as therapist attempts to extends digits of LUE, 3rd/4th PIPS do appear to be swollen with some reddening in comparison to the same joints of other hand.  Primary OT aware.   Pt remained resting in bed with all immediate needs met at end of session. Pt continues to be appropriate for skilled OT intervention to promote further functional independence.   Therapy Documentation Precautions:  Precautions Precautions: Fall Precaution Comments: Contact precautions & delayed processing Restrictions Weight Bearing Restrictions: No   Therapy/Group: Individual Therapy  Lou Cal, OTR/L, MSOT  01/10/2023, 6:17 AM

## 2023-01-10 NOTE — Progress Notes (Signed)
PROGRESS NOTE   Subjective/Complaints: No new complaints this morning Tachycardic and with mild increase in diastolic BP, propanolol ordered Tolerated 75 minutes of therapy   QIO:NGEXBMW due to cognitive/behavioral   Objective:   No results found. No results for input(s): "WBC", "HGB", "HCT", "PLT" in the last 72 hours.   No results for input(s): "NA", "K", "CL", "CO2", "GLUCOSE", "BUN", "CREATININE", "CALCIUM" in the last 72 hours.    Intake/Output Summary (Last 24 hours) at 01/10/2023 0925 Last data filed at 01/10/2023 0746 Gross per 24 hour  Intake 400 ml  Output 550 ml  Net -150 ml      Pressure Injury 10/31/22 Buttocks Left Unstageable - Full thickness tissue loss in which the base of the injury is covered by slough (yellow, tan, gray, green or brown) and/or eschar (tan, brown or black) in the wound bed. (Active)  10/31/22 2000  Location: Buttocks  Location Orientation: Left  Staging: Unstageable - Full thickness tissue loss in which the base of the injury is covered by slough (yellow, tan, gray, green or brown) and/or eschar (tan, brown or black) in the wound bed.  Wound Description (Comments):   Present on Admission: Yes     Pressure Injury 11/18/22 Toe (Comment  which one) Anterior;Right Deep Tissue Pressure Injury - Purple or maroon localized area of discolored intact skin or blood-filled blister due to damage of underlying soft tissue from pressure and/or shear. small are (Active)  11/18/22 1300  Location: Toe (Comment  which one)  Location Orientation: Anterior;Right  Staging: Deep Tissue Pressure Injury - Purple or maroon localized area of discolored intact skin or blood-filled blister due to damage of underlying soft tissue from pressure and/or shear.  Wound Description (Comments): small area on right great toe metatarsal  Present on Admission: Yes    Physical Exam: Vital Signs Blood pressure (!)  124/93, pulse (!) 106, temperature 98.2 F (36.8 C), resp. rate 15, height 5\' 1"  (1.549 m), weight 44.2 kg, SpO2 100%.  Gen: no distress, normal appearing HEENT: oral mucosa pink and moist, NCAT Cardio:Tachycardic Chest: normal effort, normal rate of breathing Abd: soft, non-distended Ext: no edema Psych: Nonverbal  Skin:  + Scaling dried skin on head present. stage 2 to right toe, area of redness between left thumb and index finger-covered in dressing, left buttock unstageable area not viewed  Neurologic: Minimal engagement on exam. Tone: MAS 3 right elbow, B knee flexors, and right hip; MAS 3 L elbow , MS 3-4 in wrist and finger flexors  Motor 0/5 strength in LUE and BLE, some purposeful movement in RUE scratches nose but cannot follow commands to cooperate with MMT   Cerebellar exam unable to assess  Sensation withdraws to pinch BLE and Right index finger  Musculoskeletal: reduced bilateral shoulder and elbow ROM due to increased tone  RIght Elbow brace in place Right wrist swelling   Assessment/Plan: 1. Functional deficits which require 3+ hours per day of interdisciplinary therapy in a comprehensive inpatient rehab setting. Physiatrist is providing close team supervision and 24 hour management of active medical problems listed below. Physiatrist and rehab team continue to assess barriers to discharge/monitor patient progress toward functional and medical goals  Care Tool:  Bathing    Body parts bathed by patient: Face   Body parts bathed by helper: Right arm, Left arm, Chest, Abdomen, Front perineal area, Buttocks, Right upper leg, Left upper leg, Right lower leg, Left lower leg, Face Body parts n/a: Right arm, Left arm, Front perineal area, Buttocks, Right upper leg, Left upper leg, Right lower leg, Left lower leg   Bathing assist Assist Level: Maximal Assistance - Patient 24 - 49%     Upper Body Dressing/Undressing Upper body dressing   What is the patient wearing?:  Pull over shirt    Upper body assist Assist Level: Total Assistance - Patient < 25%    Lower Body Dressing/Undressing Lower body dressing      What is the patient wearing?: Pants, Incontinence brief     Lower body assist Assist for lower body dressing: Dependent - Patient 0%     Toileting Toileting Toileting Activity did not occur (Clothing management and hygiene only): N/A (no void or bm)  Toileting assist Assist for toileting: Dependent - Patient 0%     Transfers Chair/bed transfer  Transfers assist  Chair/bed transfer activity did not occur: Safety/medical concerns  Chair/bed transfer assist level: Total Assistance - Patient < 25%     Locomotion Ambulation   Ambulation assist   Ambulation activity did not occur: Safety/medical concerns          Walk 10 feet activity   Assist  Walk 10 feet activity did not occur: Safety/medical concerns        Walk 50 feet activity   Assist Walk 50 feet with 2 turns activity did not occur: Safety/medical concerns         Walk 150 feet activity   Assist Walk 150 feet activity did not occur: Safety/medical concerns         Walk 10 feet on uneven surface  activity   Assist Walk 10 feet on uneven surfaces activity did not occur: Safety/medical concerns         Wheelchair     Assist Is the patient using a wheelchair?: Yes Type of Wheelchair: Manual (tilt in space)    Wheelchair assist level: Dependent - Patient 0%      Wheelchair 50 feet with 2 turns activity    Assist    Wheelchair 50 feet with 2 turns activity did not occur: Safety/medical concerns   Assist Level: Dependent - Patient 0%   Wheelchair 150 feet activity     Assist  Wheelchair 150 feet activity did not occur: Safety/medical concerns   Assist Level: Dependent - Patient 0%   Blood pressure (!) 124/93, pulse (!) 106, temperature 98.2 F (36.8 C), resp. rate 15, height 5\' 1"  (1.549 m), weight 44.2 kg, SpO2  100%.  Medical Problem List and Plan: 1. 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  Impaired mobility: continue Lovenox, weekly creatinine ordered             -antiplatelet therapy: N/A 3. Pain Management: Tylenol as needed  4. History of anxiety: discussed with her mom that she feels this was a big risk factor for her accident  5. Neuropsych/cognition: This patient is not capable of making decisions on her own behalf. 6. Left buttock unstageable ulcer.  continue Medihoney to buttock wound daily cover with foam dressing.  Changing dressing every 3 days or as needed soiling   7. Fluids/Electrolytes/Nutrition: Routine in and outs with follow-up chemistries  -Eating 100% most meals with total assist 8.  Cavitary right lower lobe pneumonia likely aspiration pneumonia/MRSA pneumonia.  Resolved    Latest Ref Rng & Units 12/20/2022   10:54 AM 12/11/2022    7:15 AM 12/03/2022    7:18 AM  CBC  WBC 4.0 - 10.5 K/uL 5.6  9.0  8.7   Hemoglobin 12.0 - 15.0 g/dL 41.3  24.4  01.0   Hematocrit 36.0 - 46.0 % 42.8  45.1  39.7   Platelets 150 - 400 K/uL 253  329  218      9.  History of  drug abuse.  Positive cocaine on urine drug screen.  Provide counseling  10.  AKI/hypovolemia and ATN.  resolved    Latest Ref Rng & Units 01/05/2023    6:57 AM 12/29/2022    5:46 AM 12/22/2022   12:16 PM  BMP  Creatinine 0.44 - 1.00 mg/dL 2.72  5.36  6.44     11.  Mild transaminitis with rhabdomyolysis.  Both resolved  12.  Hypotension. Resolved, d/c midodrine  13. Impaired initiation: continue amantadine 100mg  BID  14. Suboptimal vitamin D: continue 1,000U D3 daily  15. Spasticity:   -discussed serial casting with OT and her mom  -increased baclofen to 20mg  TID and tizanidine to 4mg  TID. Continue wrist, elbow brace, and PRAFOs. Ordered a night splint for LLE  16. Bowel and bladder incontinence: continue bowel and bladder program  -LBM 8/24      17. Left hand skin maceration: Eucerin ordered, discussed hand split with OT, conitnue  18. History of magnesium deficiency: magnesium 500mg  started HS    01/10/2023    5:56 AM 01/09/2023    7:38 PM 01/09/2023    6:48 PM  Vitals with BMI  Systolic 124 136 034  Diastolic 93 88 94  Pulse 106  742    19. History of elevated SBP: Increase tizanidine to 4mg  TID. Magnesium supplement added HS  20. Area of redness between left thumb and index finger: continue foam dressing  21. Scalp eczema: selsun blue ordered, continue  22. Fatigue: improved, discussed with team may be secondary to anti-spasticity medications, B12 reviewed and is 335, in suboptimal range, will give 1,000U B12 injection 8/7, will order monthly as needed, metanx started, increase to BID, continue  23. Right wrist swelling: XR ordered and shows no acute fracture, shows 2.80mm ulnar variance, brace removed to given patient a break from it. Voltaren gel ordered prn for discomfort, continue, CT ordered and show no acute osseus injury  23. Dysphagia: continue D3/thins, d/c Ensure as she is eating meals well  24. MRSA nares positive: negative on  repeat, precautions  d/ced  25. Tachycardic: propanolol 10mg  daily added     LOS: 53 days A FACE TO FACE EVALUATION WAS PERFORMED  Clint Bolder P Junetta Hearn 01/10/2023, 9:25 AM

## 2023-01-10 NOTE — Progress Notes (Signed)
Physical Therapy Session Note  Patient Details  Name: Nicole [name] MRN: [id number] Date of Birth: [birth year]  Today's Date: 01/10/2023 PT Individual Time: 0915-0959 PT Individual Time Calculation (min): 44 min   Short Term Goals: Week 6:  PT Short Term Goal 1 (Week 6): Pt will initiate bed mobility requiring no more than totalA. PT Short Term Goal 1 - Progress (Week 6): Progressing toward goal PT Short Term Goal 2 (Week 6): Pt will maintain sitting balance unsupported for up to 45 seconds PT Short Term Goal 2 - Progress (Week 6): Progressing toward goal PT Short Term Goal 3 (Week 6): Pt will demonstrate command follow for >10% of tasks. PT Short Term Goal 3 - Progress (Week 6): Progressing toward goal PT Short Term Goal 4 (Week 6): [name] RLE will demonstrate improved joint mobility into knee/ hip extension and ankle DF to neutral. PT Short Term Goal 4 - Progress (Week 6): Progressing toward goal Week 7:  PT Short Term Goal 1 (Week 7): Pt will initiate bed mobility requiring no more than totalA. PT Short Term Goal 2 (Week 7): Pt will maintain sitting balance unsupported for up to 45 seconds PT Short Term Goal 3 (Week 7): Pt will demonstrate command follow for >10% of tasks. PT Short Term Goal 4 (Week 7): [name] RLE will demonstrate improved joint mobility into knee/ hip extension and ankle DF to neutral.  Skilled Therapeutic Interventions/Progress Updates:  Patient seated upright in TIS w/c with L WHO donned to L UE and dressed for therapy session. Head position is fully flexed forward d/t significant Bil SCM  and R scalene tone. Patient awake/ alert and demos turn of head to direction of therapist to maintain gaze briefly.    Patient with no indication of pain at start of session. Increased bodily tone creates very flexed position despite back tilt of TIS w/c with RLE on elevated leg rest.    Therapeutic Activity/ NMR/ Manual Therapy Seated LLE stretch into knee extension and ankle DF to  prevent contracture. Provided assisted positioning of head/ neck into neutral positioning and progression into PROM into Bil rotation and L lateral flexion with mobilizations of middle cervical vertebrae to R.   Pt positioned in w/c in gym and prepared for stand in standing frame. Pt quickly acknowledges other PT in room that has worked with pt. Asked pt if she chose a pink shirt to wear today to match with other [name] shirt. Pt responds with "Un-uh".   Passive head positioning into neutral with attempt to engage pt in head control/ eye mvmt toward conscious acknowledgement of items in front of her. Minimal changes in position/ awareness noted throughout.  Attempt to engage pt throughout session with conversation. Increased appropriate responses noted today with response to question re: if pt has move her bowels while standing with pt responding with laugh and nod of head.   Transfers: Pt performed sit<>stand from w/c to standing frame with DEP use of belt. Bil arms positioned to 90* in lateral abduction on table of standing frame. L scap mobilizations performed with GH movement to pt's range.   Attempt to load RLE for increased functional DF to neutral as pt demos increased R knee flexion and ankle DF with hold of stance on ball of R foot.  Increased RLE ankle PF and knee flexion with difficulty positioning leg into extension. LLE also positions into flexion with foot drop when not weight bearing. Provided vc throughout for sequencing and technique.   NMR performed  for improvements in motor control, cognition, and maintaining body mobility for future purposeful movements.   All needs within reach. NT notified as to pt's disposition and potential need for personal care from potential UI/ BM.    Therapy Documentation Precautions:  Precautions Precautions: Fall Precaution Comments: Contact precautions & delayed processing Restrictions Weight Bearing Restrictions: No General:   Vital Signs:    Pain: Minimal pain related this session. Demoed with facial grimace in initial joint movement and then relieves with time in position.   Therapy/Group: Individual Therapy  Loel Dubonnet PT, DPT, CSRS 01/10/2023, 10:47 AM

## 2023-01-10 NOTE — Patient Care Conference (Signed)
Inpatient RehabilitationTeam Conference and Plan of Care Update Date: 01/10/2023   Time: 11:04 AM    Patient Name: Nicole [name]      Medical Record Number: [id number]  Date of Birth: [birth year] Sex: Female         Room/Bed: 4W09C/4W09C-01 Payor Info: Payor: [employer] / Plan: [employer] / Product Type: *No Product type* /    Admit Date/Time:  11/18/2022  3:15 PM  Primary Diagnosis:  Hypoxic brain injury [facility])  Hospital Problems: Principal Problem:   Hypoxic brain injury (HCC) Active Problems:   Protein-calorie malnutrition, severe    Expected Discharge Date: Expected Discharge Date:  (SNF pending)  Team Members Present: Physician leading conference: Dr. Sula Soda Social Worker Present: Dossie Der, LCSW PT Present: Casimiro Needle, PT OT Present: Bretta Bang, OT SLP Present: [name], SLP     Current Status/Progress Goal Weekly Team Focus  Bowel/Bladder   Incontinent of b/b   Regain some continence   Assist with toileting needs Qshift and prn    Swallow/Nutrition/ Hydration               ADL's   dependent/max assist BADL   max / mod assist   Localized response to stim, participation in BADL, Postural control    Mobility   Continued DEP care for all functional mobility. Continued attempts to vocalize this week - correctly related "no" during outdoor session. Minimal improvement noted in holding head upright when arousal/ alertness increased. Continued attempts to elicit head nod/ shakes.  Severely flexed posturing with bracing applied to ankles, wrists, elbows and neck. Continues to flex LE and turn slightly to R side reflexively. Continued ability locate therapist with eyes at start of session. Continued flexor tone noted in RLE - new dynamic night splint ordered.   TotA overall  Barriers: Very slow improvement in awareness, new flexed posturing, rare volitional movements /// Continued focus on bed mobility, sitting balance, improving  alertness and volitional movements with cues, functional transfers, stretching into extension, any NMR and cueing for motor control, DEP ambulation with 3 musketeer assist.    Communication   maxA to follow familiar 1 step commands - totalA for unfamiliar/new commands. Increased responsiveness to Y/N questions via head nod this week - remains maxA overall   answer Y/N questions re wants/needs w/ modA   Y/N responses, following commands, continued diagnostic tx to ID any additional modalities to assist w/ communication    Safety/Cognition/ Behavioral Observations  Overall responsiveness and attention is improving, though success remains variable from max-totalA throughout the day and intermittently day to day   consistent maxA   sustained attention, eye gaze/tracking, tolerance of increased stimuli    Pain   No facial grimacing at this time   Free from pain   Assess Qshift and prn    Skin   Stage 2 to buttocks, medihoney and foam dressing   Will maintain skin integrity with no further skin breakdown  Assess qshift and prn, Continue dressing changes.      Discharge Planning:  Mom has guardianship court date this week, continue to await disability approval and long term care medicaid will then be able to place her   Team Discussion: Patient post hypoxic brain injury with lots of tone; medications adjusted per MD and adding braces, immobilizers, etc. For positioning. She continues to show improvement but remains dependent for function.  Patient on target to meet rehab goals: Note more sustained eye contact, small vocalizations, attending more and  smiles/nods at times, minimal head control improvement.  Needs total assist to follow unfamiliar commands. Was able to feed self using a spoon with min - mod assist and locate and recovered a dropped utensil.  *See Care Plan and progress notes for long and short-term goals.   Revisions to Treatment Plan:  Knee immobilzer Dynamic night  splint   Teaching Needs: Safety, medications, skin care, transfers, toileting, etc.   Current Barriers to Discharge: Home enviroment access/layout, Incontinence, and Lack of/limited family support and wound care.  Possible Resolutions to Barriers: SNF pending     Medical Summary Current Status: tachycardia, underweight, spasticity, hypertension, impaired initiation  Barriers to Discharge: Medical stability  Barriers to Discharge Comments: tachycardia, underweight, spasticity, hypertension, impaired initiation Possible Resolutions to [employer] Focus: propanolol started, continue D3/this diet, conitnue tizanidine 4mg  TID, continue baclofen 20mg  BID, continue amantadine 100mg  BID   Continued Need for Acute Rehabilitation Level of Care: The patient requires daily medical management by a physician with specialized training in physical medicine and rehabilitation for the following reasons: Direction of a multidisciplinary physical rehabilitation program to maximize functional independence : Yes Medical management of patient stability for increased activity during participation in an intensive rehabilitation regime.: Yes Analysis of laboratory values and/or radiology reports with any subsequent need for medication adjustment and/or medical intervention. : Yes   I attest that I was present, lead the team conference, and concur with the assessment and plan of the team.   Chana Bode B 01/10/2023, 4:22 PM

## 2023-01-10 NOTE — Progress Notes (Signed)
Occupational Therapy Weekly Progress Note  Patient Details  Name: Nicole [name] MRN: [id number] Date of Birth: 1975/12/17  Beginning of progress report period: January 03, 2023 End of progress report period: January 10, 2023  Today's Date: 01/10/2023 OT Individual Time: 4098-1191 OT Individual Time Calculation (min): 73 min    Patient has met 1 of 3 short term goals.  Patient is progressing toward goals with increased attention and more frequent localized response.    Patient continues to demonstrate the following deficits: muscle weakness and muscle joint tightness, decreased cardiorespiratoy endurance, impaired timing and sequencing, abnormal tone, unbalanced muscle activation, ,decreased initiation, decreased attention, decreased awareness, decreased problem solving, decreased safety awareness, decreased memory, and delayed processing, and decreased sitting balance, decreased standing balance, decreased postural control, hemiplegia, and decreased balance strategies and therefore will continue to benefit from skilled OT intervention to enhance overall performance with BADL and Reduce care partner burden.  Patient progressing toward long term goals..  Continue plan of care.  OT Short Term Goals Week 7:  OT Short Term Goal 1 (Week 7): Patient will sit unsupported at edge of bed x 25 seconds in preparation for level surface transfer OT Short Term Goal 1 - Progress (Week 7): Progressing toward goal OT Short Term Goal 2 (Week 7): Patient will demonstrate wrist extension to neutral in bilateral wrists without indication of pain OT Short Term Goal 2 - Progress (Week 7): Met OT Short Term Goal 3 (Week 7): Patient will change orientation of toothbrush in mouth to clean left and right sides of teeth with mod cueing following hand over hand to initiate OT Short Term Goal 3 - Progress (Week 7): Progressing toward goal Week 8: OT Short Term Goal 1 (Week 8): Patient will sit unsupported at edge of bed x 25  seconds in preparation for level surface transfer OT Short Term Goal 2 (Week 8): Patient will change orientation of toothbrush in mouth to clean left and right sides of teeth with mod cueing following hand over hand to initiate OT Short Term Goal 3 (Week 8): Patient will bring hand to food to spear food x 3 in meal, then bring fork and orient to mouth with min assist  Skilled Therapeutic Interventions/Progress Updates:   Patient received supine in bed, awake but lethargic.  Provided stretch to right elbow in bed.  Patient assisted to sitting edge of bed, then transferred to wheelchair to eat breakfast.  Patient able to feed herself with mod/min assist.  Patient able to appropriately hold fork, initially needed food speared onto fork but then able to bring fork to mouth.  Patient lifting head up to approach cup for drinking.  At times patient able to spear food on fork - visually attending to food on plate.  At one point patient dropped her fork into her lap, and she was able to locate it and pick it up!   Provided consistent and prolonged stretch to left wrist, digits, forearm while she was eating.   Patient assisted to bathe and dress - left up in wheelchair with safety belt in place and engaged and call bell nearby.    Therapy Documentation Precautions:  Precautions Precautions: Fall Precaution Comments: Contact precautions & delayed processing Restrictions Weight Bearing Restrictions: No  Pain:  Winces with passive range of motion to UE's      Therapy/Group: Individual Therapy  [name] 01/10/2023, 9:49 AM

## 2023-01-10 NOTE — Progress Notes (Signed)
Patient ID: Nicole [name], female   DOB: June 20, 1975, 47 y.o.   MRN: [id number]  Met with Mom who reports guardianship court date is tomorrow and she will be there instead of her. Updated her regarding the team conference and pt dropping fork and picking it up on her own and responding no when asked a question. Pt is making eye contact more this week let's hope this continues. Continue to work on getting her in a SNF awaiting disability and long term care medicaid.

## 2023-01-10 NOTE — Progress Notes (Signed)
Speech Language Pathology Daily Session Note  Patient Details  Name: Nicole [name] MRN: [id number] Date of Birth: 1975-07-29  Today's Date: 01/10/2023 SLP Individual Time: 1130-1200 SLP Individual Time Calculation (min): 30 min  Short Term Goals: Week 8: SLP Short Term Goal 1 (Week 8): Pt will present w/ appropriate response to functional stimuli (i.e. accept spoon, grasp basic item appropriately, response to tactile/auditory stimuli) 20% of the time w/ maxA multimodal cues SLP Short Term Goal 2 (Week 8): Patient will follow 1-step commands within a context in 20% of opportunities with Max A multimodal cues SLP Short Term Goal 3 (Week 8): Patient will utilize multimodal communication as needed to answer basic yes/no questions with modA in 20% of opportunities  Skilled Therapeutic Interventions:   Pt greeted at bedside. She was awake/alert in bed upon SLP arrival. Pt noted to maintain eye contact w/ SLP during initial greeting and track to midline. SLP utilized errorless learning to provide orientation information, including time of day. She was able to answer biographical and environmental Y/N questions w/ maxA visual/verbal cues. Throughout tx tasks, responses remained inconsistent and sporadic. SLP then facilitated task following simple directions (facial movements). Groping/attempted completion noted during 3/5 trials and pt adequately completed 2/5 trials w/ additional time and maxA visual/verbal cues. Pt presented w/ spontaneous sigh x1 though no vocalizations or mouthing of words noted during automatic speech tasks and humming tasks. Also presented w/ smile x2 in response to SLP's comments. She was left in the bed with the alarm set and call light within reach. Recommend cont ST per POC.   Pain Unable to report pain. Appeared comfortable.    Therapy/Group: Individual Therapy  Nicole [name] 01/10/2023, 2:40 PM

## 2023-01-11 MED ORDER — PROPRANOLOL HCL 20 MG PO TABS
10.0000 mg | ORAL_TABLET | Freq: Two times a day (BID) | ORAL | Status: DC
  Administered 2023-01-11 – 2023-01-18 (×14): 10 mg via ORAL
  Filled 2023-01-11 (×14): qty 1

## 2023-01-11 MED ORDER — ENSURE ENLIVE PO LIQD
237.0000 mL | Freq: Two times a day (BID) | ORAL | Status: DC
  Administered 2023-01-12 – 2023-01-19 (×12): 237 mL via ORAL

## 2023-01-11 NOTE — Progress Notes (Addendum)
Nutrition Follow-up  DOCUMENTATION CODES:   Severe malnutrition in context of acute illness/injury  INTERVENTION:  - Continue Dys 3, thin liquid diet.   - Add Ensure Enlive po BID, each supplement provides 350 kcal and 20 grams of protein.  NUTRITION DIAGNOSIS:   Severe Malnutrition related to acute illness as evidenced by moderate fat depletion, moderate muscle depletion.  GOAL:   Patient will meet greater than or equal to 90% of their needs  MONITOR:   PO intake, Supplement acceptance  REASON FOR ASSESSMENT:   Malnutrition Screening Tool    ASSESSMENT:   47 y.o. 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 PMH includes: anxiety, asthma, depression.  Meds reviewed: Vit C, Vit D3, magonate, MVI. Labs reviewed: WDL.   Pt continues on a Dys 3 diet. PO intakes at 75-100%. Supplements were discontinued. RD will add supplements back in setting of Severe Malnutrition and need for weight gain (discussed with MD). RD will continue to monitor PO intakes.   Diet Order:   Diet Order             DIET DYS 3 Room service appropriate? Yes; Fluid consistency: Thin  Diet effective now                   EDUCATION NEEDS:   Not appropriate for education at this time  Skin:  Skin Assessment: Skin Integrity Issues: Skin Integrity Issues:: Unstageable Unstageable: L buttocks  Last BM:  8/29 - type 2  Height:   Ht Readings from Last 1 Encounters:  11/18/22 5\' 1"  (1.549 m)    Weight:   Wt Readings from Last 1 Encounters:  01/11/23 45.6 kg    Ideal Body Weight:     BMI:  Body mass index is 18.99 kg/m.  Estimated Nutritional Needs:   Kcal:  1600-1800  Protein:  80-100 gm  Fluid:  >/= 1.6 L  Bethann Humble, RD, LDN, CNSC.

## 2023-01-11 NOTE — Progress Notes (Signed)
Physical Therapy Session Note  Patient Details  Name: Nicole [name] MRN: [id number] Date of Birth: [birth year]  Today's Date: 01/11/2023 PT Individual Time: 6578-4696 PT Individual Time Calculation (min): 50 min   Short Term Goals: Week 7:  PT Short Term Goal 1 (Week 7): Pt will initiate bed mobility requiring no more than totalA. PT Short Term Goal 2 (Week 7): Pt will maintain sitting balance unsupported for up to 45 seconds PT Short Term Goal 3 (Week 7): Pt will demonstrate command follow for >10% of tasks. PT Short Term Goal 4 (Week 7): [name] RLE will demonstrate improved joint mobility into knee/ hip extension and ankle DF to neutral.  Skilled Therapeutic Interventions/Progress Updates: Pt presented in bed in NAD. Performed stretching ROM to BLE for tone management. PTA threaded pants and completed rolling L/R total A to pull pants over hips. Pt advised that will trial tilt feature on Kregg Bed to allow for increased weight bearing through BLE. When PTA asked if she wanted some music pt appeared to nod head "yes". Kregg bed set up and pt transitioned up to 70 degrees. While music was playing pt appeared to be moving head to music (Ramones).  PTA applying pressure BLE to promote improved knee extension and weight bearing through RLE. [name] fee appeared to be slipping (?to avoid extension). Pt lowered and PTA donned shoes total A. Bed then raised again to 79 degrees with PTA alternating between stabilizing knees and repositioning shoulders to have improved extension in trunk. Pt then lowered and completed supine to sit at EOB total A. Pt then completed dependent stand pivot transfer with emphasis on weight through BLE to TIS. Pt repositioned in TIS and "comfy splint" donned. Pt left resting in TIS with pt in NAD.      Therapy Documentation Precautions:  Precautions Precautions: Fall Precaution Comments: Contact precautions & delayed processing Restrictions Weight Bearing Restrictions:  No General:   Vital Signs: Therapy Vitals Temp: 98.5 F (36.9 C) Pulse Rate: 87 Resp: 17 BP: (!) 113/92 Patient Position (if appropriate): Lying Oxygen Therapy SpO2: 100 % O2 Device: Room Air Pain:   Mobility:   Locomotion :    Trunk/Postural Assessment :    Balance:   Exercises:   Other Treatments:      Therapy/Group: Individual Therapy  Tyeler Goedken 01/11/2023, 4:09 PM

## 2023-01-11 NOTE — Progress Notes (Signed)
Speech Language Pathology Daily Session Note  Patient Details  Name: DAICY BEUS MRN: [id number] Date of Birth: Feb 06, 1976  Today's Date: 01/11/2023 SLP Individual Time: 1445-1530 SLP Individual Time Calculation (min): 45 min  Short Term Goals: Week 8: SLP Short Term Goal 1 (Week 8): Pt will present w/ appropriate response to functional stimuli (i.e. accept spoon, grasp basic item appropriately, response to tactile/auditory stimuli) 20% of the time w/ maxA multimodal cues SLP Short Term Goal 2 (Week 8): Patient will follow 1-step commands within a context in 20% of opportunities with Max A multimodal cues SLP Short Term Goal 3 (Week 8): Patient will utilize multimodal communication as needed to answer basic yes/no questions with modA in 20% of opportunities  Skilled Therapeutic Interventions:   Pt greeted at bedside. She was awake in bed upon SLP arrival. SLP facilitated functional tx tasks targeting cognition. During initial conversation, she was able to answer 4 Y/N questions in a row via head nod re wants/needs and notable events for the day (having therapy, lunch, etc). Responses noted when provided w/ additional processing time, repetitions, and maxA visual/verbal cues. After 4 responses, noted to require short rest break 2* loss of attention/reduced alertness. After rest break, she was able to answer 3/5 biographical Y/N questions w/ accurate responses noted during 2/5 questions. She benefited from maxA visual/verbal/tactile cues and additional time to attempt 5 commands (facial movements). Groping noticed during 2/5 trials and pt was able to accurately complete 1/5 commands (eyebrow raise). Spont smile x2 and spont laugh x1 noted throughout tx session. She was left in bed with the alarm set and call light within reach. Recommend cont ST per POC.   Pain  Unable to report pain - appeared comfortable  Therapy/Group: Individual Therapy  [name] 01/11/2023, 5:13 PM

## 2023-01-11 NOTE — Progress Notes (Signed)
Physical Therapy Session Note  Patient Details  Name: Nicole [name] MRN: [id number] Date of Birth: Jun 09, 1975  Today's Date: 01/11/2023 PT Individual Time: 1310-1415 PT Individual Time Calculation (min): 65 min   Short Term Goals: Week 7:  PT Short Term Goal 1 (Week 7): Pt will initiate bed mobility requiring no more than totalA. PT Short Term Goal 2 (Week 7): Pt will maintain sitting balance unsupported for up to 45 seconds PT Short Term Goal 3 (Week 7): Pt will demonstrate command follow for >10% of tasks. PT Short Term Goal 4 (Week 7): [name] RLE will demonstrate improved joint mobility into knee/ hip extension and ankle DF to neutral.  Skilled Therapeutic Interventions/Progress Updates: Patient in TIS on entrance to room. Patient greeted PTA with a smile on entrance after a few moments, and maintained eye contact with head turning slightly to the L to face PTA. Pt did not receive lunch on time, which arrived shortly after therapy arrival. Beginning PT session focused on patient nutrition intake as pt has started to self feed. Pt with food on lap trap and provided with modA (occasionally minA) to navigate food plate with variety of options, and to get chicken onto fork (mash potatoes were a bit easier). Pt would bring food to mouth, mostly self initiated after food is on fork but would sometimes need a gentle tap. Pt also attempted to comb hair with fork with some food on it and would laugh. Pt required increased time and effort throughout eating. PTA held drink for pt while pt drank from straw throughout.    Therapeutic Activity: Bed Mobility: Pt performed sit to supine from EOB with max/totalA, and totalA to scoot to [facility] via chuck + 2.  Transfers: Pt performed stand pivot transfer from TIS to EOB with max/totalA (max/totalA to anteriorly scoot to edge of TIS).  Neuromuscular Re-ed: NMR facilitated during session with focus on B WB  in LE for proprioceptive feedback. - Pt in standing frame for  roughly 8 minutes with PTA and rehab tech on either side holding pt at the shoulder and wrist in order to bring truck upright vs in forward flexion, and to stretch bicep musculature for contracture control. Pt with max cues to engage visual field by extending head (PTA suggested there was a racoon outside of window and pt started to laugh, but did not lift head throughout standing frame trial). Pt was max/totalA to donn/doff standing frame harness, and to manage B UE on table (towels underneath forearms for comfort). Pt noted to be soiled towards the end of the session and was transported back to room for personal care by NT  NMR performed for improvements in motor control and coordination, balance, sequencing, judgement, and self confidence/ efficacy in performing all aspects of mobility at highest level of independence.   Patient supine in bed at end of session with brakes locked, NT present, bed alarm set, and all needs within reach.      Therapy Documentation Precautions:  Precautions Precautions: Fall Precaution Comments: Contact precautions & delayed processing Restrictions Weight Bearing Restrictions: No   Therapy/Group: Individual Therapy  Japneet Staggs PTA 01/11/2023, 3:38 PM

## 2023-01-11 NOTE — Progress Notes (Signed)
PROGRESS NOTE   Subjective/Complaints: No new complaints this morning Nodding head in response to conversation Awaiting SNF Making slow progress   ROS: Limited due to cognitive/behavioral   Objective:   No results found. No results for input(s): "WBC", "HGB", "HCT", "PLT" in the last 72 hours.   No results for input(s): "NA", "K", "CL", "CO2", "GLUCOSE", "BUN", "CREATININE", "CALCIUM" in the last 72 hours.    Intake/Output Summary (Last 24 hours) at 01/11/2023 0935 Last data filed at 01/11/2023 0831 Gross per 24 hour  Intake 704 ml  Output 250 ml  Net 454 ml      Pressure Injury 10/31/22 Buttocks Left Unstageable - Full thickness tissue loss in which the base of the injury is covered by slough (yellow, tan, gray, green or brown) and/or eschar (tan, brown or black) in the wound bed. (Active)  10/31/22 2000  Location: Buttocks  Location Orientation: Left  Staging: Unstageable - Full thickness tissue loss in which the base of the injury is covered by slough (yellow, tan, gray, green or brown) and/or eschar (tan, brown or black) in the wound bed.  Wound Description (Comments):   Present on Admission: Yes     Pressure Injury 11/18/22 Toe (Comment  which one) Anterior;Right Deep Tissue Pressure Injury - Purple or maroon localized area of discolored intact skin or blood-filled blister due to damage of underlying soft tissue from pressure and/or shear. small are (Active)  11/18/22 1300  Location: Toe (Comment  which one)  Location Orientation: Anterior;Right  Staging: Deep Tissue Pressure Injury - Purple or maroon localized area of discolored intact skin or blood-filled blister due to damage of underlying soft tissue from pressure and/or shear.  Wound Description (Comments): small area on right great toe metatarsal  Present on Admission: Yes    Physical Exam: Vital Signs Blood pressure (!) 130/95, pulse 91, temperature  98 F (36.7 C), resp. rate 18, height 5\' 1"  (1.549 m), weight 44.2 kg, SpO2 99%.  Gen: no distress, normal appearing HEENT: oral mucosa pink and moist, NCAT Cardio: HR normalized Chest: normal effort, normal rate of breathing Abd: soft, non-distended Ext: no edema Psych: Nonverbal  Skin:  + Scaling dried skin on head present. stage 2 to right toe, area of redness between left thumb and index finger-covered in dressing, left buttock unstageable area not viewed  Neurologic: Minimal engagement on exam. Tone: MAS 3 right elbow, B knee flexors, and right hip; MAS 3 L elbow , MS 3-4 in wrist and finger flexors  Motor 0/5 strength in LUE and BLE, some purposeful movement in RUE scratches nose but cannot follow commands to cooperate with MMT   Cerebellar exam unable to assess  Sensation withdraws to pinch BLE and Right index finger  Musculoskeletal: reduced bilateral shoulder and elbow ROM due to increased tone  RIght Elbow brace in place Right wrist swelling   Assessment/Plan: 1. Functional deficits which require 3+ hours per day of interdisciplinary therapy in a comprehensive inpatient rehab setting. Physiatrist is providing close team supervision and 24 hour management of active medical problems listed below. Physiatrist and rehab team continue to assess barriers to discharge/monitor patient progress toward functional and medical goals  Care  Tool:  Bathing    Body parts bathed by patient: Face   Body parts bathed by helper: Right arm, Left arm, Chest, Abdomen, Front perineal area, Buttocks, Right upper leg, Left upper leg, Right lower leg, Left lower leg, Face Body parts n/a: Right arm, Left arm, Front perineal area, Buttocks, Right upper leg, Left upper leg, Right lower leg, Left lower leg   Bathing assist Assist Level: Maximal Assistance - Patient 24 - 49%     Upper Body Dressing/Undressing Upper body dressing   What is the patient wearing?: Pull over shirt    Upper body  assist Assist Level: Total Assistance - Patient < 25%    Lower Body Dressing/Undressing Lower body dressing      What is the patient wearing?: Pants, Incontinence brief     Lower body assist Assist for lower body dressing: Dependent - Patient 0%     Toileting Toileting Toileting Activity did not occur (Clothing management and hygiene only): N/A (no void or bm)  Toileting assist Assist for toileting: Dependent - Patient 0%     Transfers Chair/bed transfer  Transfers assist  Chair/bed transfer activity did not occur: Safety/medical concerns  Chair/bed transfer assist level: Total Assistance - Patient < 25%     Locomotion Ambulation   Ambulation assist   Ambulation activity did not occur: Safety/medical concerns          Walk 10 feet activity   Assist  Walk 10 feet activity did not occur: Safety/medical concerns        Walk 50 feet activity   Assist Walk 50 feet with 2 turns activity did not occur: Safety/medical concerns         Walk 150 feet activity   Assist Walk 150 feet activity did not occur: Safety/medical concerns         Walk 10 feet on uneven surface  activity   Assist Walk 10 feet on uneven surfaces activity did not occur: Safety/medical concerns         Wheelchair     Assist Is the patient using a wheelchair?: Yes Type of Wheelchair: Manual (tilt in space)    Wheelchair assist level: Dependent - Patient 0%      Wheelchair 50 feet with 2 turns activity    Assist    Wheelchair 50 feet with 2 turns activity did not occur: Safety/medical concerns   Assist Level: Dependent - Patient 0%   Wheelchair 150 feet activity     Assist  Wheelchair 150 feet activity did not occur: Safety/medical concerns   Assist Level: Dependent - Patient 0%   Blood pressure (!) 130/95, pulse 91, temperature 98 F (36.7 C), resp. rate 18, height 5\' 1"  (1.549 m), weight 44.2 kg, SpO2 99%.  Medical Problem List and Plan: 1.  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  Impaired mobility: continue Lovenox, weekly creatinine ordered             -antiplatelet therapy: N/A 3. Pain Management: Tylenol as needed  4. History of anxiety: discussed with her mom that she feels this was a big risk factor for her accident  5. Neuropsych/cognition: This patient is not capable of making decisions on her own behalf. 6. Left buttock unstageable ulcer.  continue Medihoney to buttock wound daily cover with foam dressing.  Changing dressing every 3 days or as needed soiling   7. Fluids/Electrolytes/Nutrition: Routine in and outs with follow-up chemistries  -Eating 100% most meals with total assist 8.  Cavitary right lower lobe pneumonia likely aspiration pneumonia/MRSA pneumonia.  Resolved    Latest Ref Rng & Units 12/20/2022   10:54 AM 12/11/2022    7:15 AM 12/03/2022    7:18 AM  CBC  WBC 4.0 - 10.5 K/uL 5.6  9.0  8.7   Hemoglobin 12.0 - 15.0 g/dL 52.8  41.3  24.4   Hematocrit 36.0 - 46.0 % 42.8  45.1  39.7   Platelets 150 - 400 K/uL 253  329  218      9.  History of drug abuse.  Positive cocaine on urine drug  screen.  Provide counseling  10.  AKI/hypovolemia and ATN.  resolved    Latest Ref Rng & Units 01/05/2023    6:57 AM 12/29/2022    5:46 AM 12/22/2022   12:16 PM  BMP  Creatinine 0.44 - 1.00 mg/dL 0.10  2.72  5.36     11.  Mild transaminitis with rhabdomyolysis.  Both resolved  12.  Hypotension. Resolved, d/c midodrine  13. Impaired initiation: continue amantadine 100mg  BID  14. Suboptimal vitamin D: continue 1,000U D3 daily  15. Spasticity:   -discussed serial casting with OT and her mom  -increased baclofen to 20mg  TID and tizanidine to 4mg  TID. Continue wrist, elbow brace, and PRAFOs. Ordered a night splint for LLE  16. Bowel and bladder incontinence: continue bowel and bladder program  -LBM 8/28, d/c colace      17. Left hand skin maceration: Eucerin ordered, discussed hand split with OT, conitnue  18. History of magnesium deficiency: magnesium 500mg  started HS    01/11/2023    6:03 AM 01/10/2023    8:50 PM 01/10/2023    5:56 AM  Vitals with BMI  Systolic 130 138 644  Diastolic 95 93 93  Pulse 91 81 106    19. History of elevated SBP: Increase tizanidine to 4mg  TID. Magnesium supplement added HS. Increase propanolol to BID  20. Area of redness between left thumb and index finger: continue foam dressing  21. Scalp eczema: selsun blue ordered, continue  22. Fatigue: improved, discussed with team may be secondary to anti-spasticity medications, B12 reviewed and is 335, in suboptimal range, will give 1,000U B12 injection 8/7, will order monthly as needed, metanx started, increase to BID, continue  23. Right wrist swelling: XR ordered and shows no acute fracture, shows 2.34mm ulnar variance, brace removed to given patient a break from it. Voltaren gel ordered prn for discomfort, continue, CT ordered and show no acute osseus injury  23. Dysphagia: continue D3/thins, d/c Ensure as she is eating meals well  24. MRSA  nares positive: negative on repeat, precautions d/ced  25.  Tachycardic: increase propanolol to BID     LOS: 54 days A FACE TO FACE EVALUATION WAS PERFORMED  Drema Pry Newt Levingston 01/11/2023, 9:35 AM

## 2023-01-11 NOTE — Progress Notes (Signed)
Occupational Therapy Session Note  Patient Details  Name: Nicole [name] MRN: [id number] Date of Birth: [birth year]  Today's Date: 01/11/2023 OT Individual Time: 4098-1191 OT Individual Time Calculation (min): 75 min    Short Term Goals: Week 8: OT Short Term Goal 1 (Week 8): Patient will sit unsupported at edge of bed x 25 seconds in preparation for level surface transfer OT Short Term Goal 2 (Week 8): Patient will change orientation of toothbrush in mouth to clean left and right sides of teeth with mod cueing following hand over hand to initiate OT Short Term Goal 3 (Week 8): Patient will bring hand to food to spear food x 3 in meal, then bring fork and orient to mouth with min assist  Skilled Therapeutic Interventions/Progress Updates:    Patient received seated in wheelchair fully dressed.  Assisted patient to wash and comb hair, then transported to Dayroom.  Worked on transfer from fully upright position of wheelchair.  Continue to work on weight shifting forward onto feet, and allowing patient to accept weight thru BLE.  Sitting balance with rest position lateral flexion weight bearing on right forearm.  Worked on trunk alignment  - toward anterior pelvis position, and trunk extension - forward weight shift toward feet.  Patient then transferred to physioball to help facilitate more lower trunk initiated weight shifts forward, backward, laterally - then stable ball, and upper trunk rotated over lower trunk.  Patient transitioned back to mat table to supine to encourage greater extension throughout.  Provided manual techniques - prolonged stretch, soft tissue mob, gentle joint mob to shoulder, elbows, wrists, hands.  Patient returned to room, safety belt in pace and engaged and call bell/ personal items in reach.    Therapy Documentation Precautions:  Precautions Precautions: Fall Precaution Comments: Contact precautions & delayed processing Restrictions Weight Bearing Restrictions: No    Pain:  Winces with passive range to elbows / hands L>R       Therapy/Group: Individual Therapy  [name] 01/11/2023, 12:43 PM

## 2023-01-11 NOTE — Progress Notes (Signed)
Patient ID: Nicole [name], female   DOB: 05/25/1975, 47 y.o.   MRN: [id number]  Mom had guardianship hearing and she was awarded guardianship over pt. Have placed form in soft chart.

## 2023-01-12 LAB — CREATININE, SERUM
Creatinine, Ser: 0.63 mg/dL (ref 0.44–1.00)
GFR, Estimated: >60 mL/min (ref >60)

## 2023-01-12 LAB — SARS CORONAVIRUS 2 BY RT PCR: SARS Coronavirus 2 by RT PCR: NEGATIVE

## 2023-01-12 NOTE — Progress Notes (Signed)
Occupational Therapy Session Note  Patient Details  Name: Nicole [name] MRN: [id number] Date of Birth: Jan 10, 1976  Today's Date: 01/12/2023 OT Individual Time: 1104-1200 OT Individual Time Calculation (min): 56 min    Short Term Goals: Week 8: OT Short Term Goal 1 (Week 8): Patient will sit unsupported at edge of bed x 25 seconds in preparation for level surface transfer OT Short Term Goal 2 (Week 8): Patient will change orientation of toothbrush in mouth to clean left and right sides of teeth with mod cueing following hand over hand to initiate OT Short Term Goal 3 (Week 8): Patient will bring hand to food to spear food x 3 in meal, then bring fork and orient to mouth with min assist  Skilled Therapeutic Interventions/Progress Updates:    Patient received seated in wheelchair.  Patient reported to have runny nose and sneezing prior session. Awaiting lab results.  Patient bathed upper body and put on clean shirt with total assist.  Patient assisted to bed to change soiled brief.  Teasing patient and patient laughing appropriately.  Patient rolled left right for hygiene and dressing.  Adjusted left elbow brace for increased extended position.  Provided prolonged stretch stretch to elbow to ensure brace at new angle was tolerated.  Donned R elbow cast.  Donned R knee immobilizer and used pillow for slight abduction of extended legs.  Placed left palm protector in left hand and added rolled washcloth to promote digit extension but soft surface.  Left bed nearly flat to promote overall extension.    Therapy Documentation Precautions:  Precautions Precautions: Fall Precaution Comments: Contact precautions & delayed processing Restrictions Weight Bearing Restrictions: No  Pain:  No pain indicated        Therapy/Group: Individual Therapy  [name] 01/12/2023, 1:12 PM

## 2023-01-12 NOTE — Progress Notes (Signed)
Physical Therapy Session Note  Patient Details  Name: Nicole [name] MRN: [id number] Date of Birth: 06/25/1975  Today's Date: 01/12/2023 PT Individual Time: 0800-0845 PT Individual Time Calculation (min): 45 min   Short Term Goals: Week 7:  PT Short Term Goal 1 (Week 7): Pt will initiate bed mobility requiring no more than totalA. PT Short Term Goal 2 (Week 7): Pt will maintain sitting balance unsupported for up to 45 seconds PT Short Term Goal 3 (Week 7): Pt will demonstrate command follow for >10% of tasks. PT Short Term Goal 4 (Week 7): [name] RLE will demonstrate improved joint mobility into knee/ hip extension and ankle DF to neutral.  Skilled Therapeutic Interventions/Progress Updates:      Pt lying in bed awake - remains nonverbal during treatment session. No signs of resting pain but does grimace during passive stretching.   Pt positioned with knees flexed and hips adducted - completed prolonged passive stretching into knee extension and ankle DF as tolerated, R > L tightness.  Supine<>sitting EOB with dependent assist, HOB elevated. Posterior lean in sitting that needs assistance for managing. Completed totalA squat pivot transfer from EOB into TIS w/c. Using mirror in room, worked on cervical extension stretching and attention. No automatic motor responses noted with familiar tasks (combing hair, washing face, etc).   Transported to day room rehab gym and assisted to mat table with totalA squat pivot transfer, using over the back technique to facilitate forward weight shifting. At [facility], worked on static sitting balance with 4" platform under B feet. Wedged foot b/w her's to keep feet and hips abducted. Poor and very delayed righting responses, needing maxA overall for sitting balance with brief periods of ability to maintain with minA.   Assisted back into her TIS w/c in similar manner and then returned to her room. Safety belt alarm in place, TIS w/c reclined for safety, all needs met.    Therapy Documentation Precautions:  Precautions Precautions: Fall Precaution Comments: Contact precautions & delayed processing Restrictions Weight Bearing Restrictions: No General:      Therapy/Group: Individual Therapy  Tamla Winkels P Jansel Vonstein  PT, DPT, CSRS  01/12/2023, 7:42 AM

## 2023-01-12 NOTE — Progress Notes (Signed)
PROGRESS NOTE   Subjective/Complaints: Sleepy after working with Christian, tolerated his physical therapy session well BP reviewed and stable No issues overnight   ROS: Limited due to cognitive/behavioral   Objective:   No results found. No results for input(s): "WBC", "HGB", "HCT", "PLT" in the last 72 hours.   Recent Labs    01/12/23 0802  CREATININE 0.63      Intake/Output Summary (Last 24 hours) at 01/12/2023 0956 Last data filed at 01/12/2023 0739 Gross per 24 hour  Intake 661 ml  Output --  Net 661 ml      Pressure Injury 10/31/22 Buttocks Left Unstageable - Full thickness tissue loss in which the base of the injury is covered by slough (yellow, tan, gray, green or brown) and/or eschar (tan, brown or black) in the wound bed. (Active)  10/31/22 2000  Location: Buttocks  Location Orientation: Left  Staging: Unstageable - Full thickness tissue loss in which the base of the injury is covered by slough (yellow, tan, gray, green or brown) and/or eschar (tan, brown or black) in the wound bed.  Wound Description (Comments):   Present on Admission: Yes     Pressure Injury 11/18/22 Toe (Comment  which one) Anterior;Right Deep Tissue Pressure Injury - Purple or maroon localized area of discolored intact skin or blood-filled blister due to damage of underlying soft tissue from pressure and/or shear. small are (Active)  11/18/22 1300  Location: Toe (Comment  which one)  Location Orientation: Anterior;Right  Staging: Deep Tissue Pressure Injury - Purple or maroon localized area of discolored intact skin or blood-filled blister due to damage of underlying soft tissue from pressure and/or shear.  Wound Description (Comments): small area on right great toe metatarsal  Present on Admission: Yes    Physical Exam: Vital Signs Blood pressure 126/85, pulse 85, temperature 98.6 F (37 C), resp. rate 17, height 5\' 1"  (1.549  m), weight 45.6 kg, SpO2 100%.  Gen: no distress, normal appearing, BMI 18.99 HEENT: oral mucosa pink and moist, NCAT Cardio: HR normalized Chest: normal effort, normal rate of breathing Abd: soft, non-distended Ext: no edema Psych: Nonverbal  Skin:  + Scaling dried skin on head present. stage 2 to right toe, area of redness between left thumb and index finger-covered in dressing, left buttock unstageable area not viewed  Neurologic: Minimal engagement on exam. Tone: MAS 3 right elbow, B knee flexors, and right hip; MAS 3 L elbow , MS 3-4 in wrist and finger flexors  Motor 0/5 strength in LUE and BLE, some purposeful movement in RUE scratches nose but cannot follow commands to cooperate with MMT   Cerebellar exam unable to assess  Sensation withdraws to pinch BLE and Right index finger  Musculoskeletal: reduced bilateral shoulder and elbow ROM due to increased tone  RIght Elbow brace in place Right wrist swelling   Assessment/Plan: 1. Functional deficits which require 3+ hours per day of interdisciplinary therapy in a comprehensive inpatient rehab setting. Physiatrist is providing close team supervision and 24 hour management of active medical problems listed below. Physiatrist and rehab team continue to assess barriers to discharge/monitor patient progress toward functional and medical goals  Care Tool:  Bathing  Body parts bathed by patient: Face   Body parts bathed by helper: Right arm, Left arm, Chest, Abdomen, Front perineal area, Buttocks, Right upper leg, Left upper leg, Right lower leg, Left lower leg, Face Body parts n/a: Right arm, Left arm, Front perineal area, Buttocks, Right upper leg, Left upper leg, Right lower leg, Left lower leg   Bathing assist Assist Level: Maximal Assistance - Patient 24 - 49%     Upper Body Dressing/Undressing Upper body dressing   What is the patient wearing?: Pull over shirt    Upper body assist Assist Level: Total Assistance -  Patient < 25%    Lower Body Dressing/Undressing Lower body dressing      What is the patient wearing?: Pants, Incontinence brief     Lower body assist Assist for lower body dressing: Dependent - Patient 0%     Toileting Toileting Toileting Activity did not occur (Clothing management and hygiene only): N/A (no void or bm)  Toileting assist Assist for toileting: Dependent - Patient 0%     Transfers Chair/bed transfer  Transfers assist  Chair/bed transfer activity did not occur: Safety/medical concerns  Chair/bed transfer assist level: Total Assistance - Patient < 25%     Locomotion Ambulation   Ambulation assist   Ambulation activity did not occur: Safety/medical concerns          Walk 10 feet activity   Assist  Walk 10 feet activity did not occur: Safety/medical concerns  Assist level: Dependent - Patient 0%     Walk 50 feet activity   Assist Walk 50 feet with 2 turns activity did not occur: Safety/medical concerns         Walk 150 feet activity   Assist Walk 150 feet activity did not occur: Safety/medical concerns         Walk 10 feet on uneven surface  activity   Assist Walk 10 feet on uneven surfaces activity did not occur: Safety/medical concerns         Wheelchair     Assist Is the patient using a wheelchair?: Yes Type of Wheelchair: Manual    Wheelchair assist level: Dependent - Patient 0%      Wheelchair 50 feet with 2 turns activity    Assist    Wheelchair 50 feet with 2 turns activity did not occur: Safety/medical concerns   Assist Level: Dependent - Patient 0%   Wheelchair 150 feet activity     Assist  Wheelchair 150 feet activity did not occur: Safety/medical concerns   Assist Level: Dependent - Patient 0%   Blood pressure 126/85, pulse 85, temperature 98.6 F (37 C), resp. rate 17, height 5\' 1"  (1.549 m), weight 45.6 kg, SpO2 100%.  Medical Problem List and Plan: 1. 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  Impaired mobility: continue Lovenox, weekly creatinine ordered             -antiplatelet therapy: N/A 3. Pain Management: Tylenol as needed  4. History of anxiety: discussed with her mom that she feels this was a big risk factor for her accident  5. Neuropsych/cognition: This patient is not capable of making decisions on her own behalf. 6. Left buttock unstageable ulcer.  continue Medihoney to buttock wound daily cover with foam dressing.  Changing dressing every 3 days or as needed soiling   7. Fluids/Electrolytes/Nutrition: Routine in and outs with follow-up chemistries  -Eating 100% most meals with total assist 8.  Cavitary right lower lobe pneumonia likely aspiration pneumonia/MRSA pneumonia.  Resolved    Latest Ref Rng & Units 12/20/2022   10:54 AM 12/11/2022    7:15 AM 12/03/2022    7:18 AM  CBC  WBC 4.0 - 10.5 K/uL 5.6  9.0  8.7   Hemoglobin 12.0 - 15.0 g/dL 57.8  46.9  62.9   Hematocrit 36.0 - 46.0 % 42.8  45.1  39.7   Platelets 150 - 400 K/uL 253  329  218      9.  History of drug abuse.  Positive cocaine on urine drug screen.  Provide  counseling  10.  AKI/hypovolemia and ATN.  resolved    Latest Ref Rng & Units 01/12/2023    8:02 AM 01/05/2023    6:57 AM 12/29/2022    5:46 AM  BMP  Creatinine 0.44 - 1.00 mg/dL 5.28  4.13  2.44     11.  Mild transaminitis with rhabdomyolysis.  Both resolved  12.  Hypotension. Resolved, d/c midodrine  13. Impaired initiation: continue amantadine 100mg  BID  14. Suboptimal vitamin D: continue 1,000U D3 daily  15. Spasticity:   -discussed serial casting with OT and her mom  continue baclofen to 20mg  TID and tizanidine to 4mg  TID. Continue wrist, elbow brace, and PRAFOs. Ordered a night splint for LLE  16. Bowel and bladder incontinence: continue bowel and bladder program  -LBM 8/28, d/c colace      17. Left hand skin maceration: Eucerin ordered, discussed hand split with OT, conitnue  18. History of magnesium deficiency: magnesium 500mg  started HS    01/12/2023    3:02 AM 01/11/2023    7:59 PM 01/11/2023    2:38 PM  Vitals with BMI  Weight   100 lbs 8 oz  Systolic 126 125   Diastolic 85 83   Pulse 85 89     19. History of elevated SBP: Increase tizanidine to 4mg  TID. Magnesium supplement added HS. Increase propanolol to BID  20. Area of redness between left thumb and index finger: continue foam dressing  21. Scalp eczema: selsun blue ordered, continue  22. Fatigue: improved, discussed with team may be secondary to anti-spasticity medications, B12 reviewed and is 335, in suboptimal range, will give 1,000U B12 injection 8/7, will order monthly as needed, metanx started, increase to BID, continue  23. Right wrist swelling: XR ordered and shows no acute fracture, shows 2.3mm ulnar variance, brace removed to given patient a break from it. Voltaren gel ordered prn for discomfort, continue, CT ordered and show no acute osseus injury  23. Dysphagia: continue D3/thins, d/c Ensure as she is eating meals well  24. MRSA nares positive: negative on repeat, precautions d/ced  25.  Tachycardic: increase propanolol to BID     LOS: 55 days A FACE TO FACE EVALUATION WAS PERFORMED  Drema Pry Marlis Oldaker 01/12/2023, 9:56 AM

## 2023-01-12 NOTE — Progress Notes (Signed)
Speech Language Pathology Daily Session Note  Patient Details  Name: Nicole [name] MRN: [id number] Date of Birth: 05-07-1976  Today's Date: 01/12/2023 SLP Individual Time: 0930-1030 SLP Individual Time Calculation (min): 60 min  Short Term Goals: Week 8: SLP Short Term Goal 1 (Week 8): Pt will present w/ appropriate response to functional stimuli (i.e. accept spoon, grasp basic item appropriately, response to tactile/auditory stimuli) 20% of the time w/ maxA multimodal cues SLP Short Term Goal 2 (Week 8): Patient will follow 1-step commands within a context in 20% of opportunities with Max A multimodal cues SLP Short Term Goal 3 (Week 8): Patient will utilize multimodal communication as needed to answer basic yes/no questions with modA in 20% of opportunities  Skilled Therapeutic Interventions:   Pt greeted at bedside. She was asleep upon SLP arrival, but woke easily to look towards SLP. During initial conversation, pt presented w/ response to Y/N questions via head nod ~20% the time. SLP then assisted pt to speech office and challenged pt to follow commands (facial movements). She accurately completed 1/5 commands (smile). No additional groping/attempts noted for remainder of trials. She required frequent rest breaks throughout tx session 2* increased fatigue. Additionally, she presented w/ multiple sneezes, a runny nose, and watery eyes. She was then assisted back to her room via TIS chair. When SLP was assisting pt by holding the kleenex, she was [facility] able to move her head L to R to wipe her nose. SLP also utilized errorless learning to review orientation information, including time of day. [name] nurse and doctor notified of sneezing, runny nose, and watery eyes. Pt left reclined in her chair w/ the TV on low volume for gentle auditory stimulation. Call light within reach. Recommend cont ST per POC.   Pain Unable to report pain  Therapy/Group: Individual Therapy  [name] 01/12/2023, 12:50  PM

## 2023-01-13 NOTE — Progress Notes (Signed)
PROGRESS NOTE   Subjective/Complaints: Resting in room, no issues overnight. Vital stable, p.o.'s improving, had medium incontinent bowel movement this evening.  ROS: Limited due to cognitive/behavioral   Objective:   No results found. No results for input(s): "WBC", "HGB", "HCT", "PLT" in the last 72 hours.   Recent Labs    01/12/23 0802  CREATININE 0.63      Intake/Output Summary (Last 24 hours) at 01/13/2023 2301 Last data filed at 01/13/2023 1802 Gross per 24 hour  Intake 1080 ml  Output --  Net 1080 ml      Pressure Injury 10/31/22 Buttocks Left Unstageable - Full thickness tissue loss in which the base of the injury is covered by slough (yellow, tan, gray, green or brown) and/or eschar (tan, brown or black) in the wound bed. (Active)  10/31/22 2000  Location: Buttocks  Location Orientation: Left  Staging: Unstageable - Full thickness tissue loss in which the base of the injury is covered by slough (yellow, tan, gray, green or brown) and/or eschar (tan, brown or black) in the wound bed.  Wound Description (Comments):   Present on Admission: Yes     Pressure Injury 11/18/22 Toe (Comment  which one) Anterior;Right Deep Tissue Pressure Injury - Purple or maroon localized area of discolored intact skin or blood-filled blister due to damage of underlying soft tissue from pressure and/or shear. small are (Active)  11/18/22 1300  Location: Toe (Comment  which one)  Location Orientation: Anterior;Right  Staging: Deep Tissue Pressure Injury - Purple or maroon localized area of discolored intact skin or blood-filled blister due to damage of underlying soft tissue from pressure and/or shear.  Wound Description (Comments): small area on right great toe metatarsal  Present on Admission: Yes    Physical Exam: Vital Signs Blood pressure (!) 129/94, pulse 99, temperature 97.7 F (36.5 C), temperature source Oral, resp.  rate 17, height 5\' 1"  (1.549 m), weight 45.6 kg, SpO2 99%.  Gen: no distress, normal appearing, BMI 18.99 HEENT: oral mucosa pink and moist, NCAT Cardio: HR normalized Chest: normal effort, normal rate of breathing Abd: soft, non-distended Ext: no edema Psych: Nonverbal  Skin: Wounds not examined + Scaling dried skin on head present. stage 2 to right toe, area of redness between left thumb and index finger-covered in dressing, left buttock unstageable area not viewed  Neurologic: Minimal engagement on exam, tired.  Does not follow simple commands. Tone: MAS 3 right elbow, B knee flexors, and right hip; MAS 3 L elbow , MS 3-4 in wrist and finger flexors  Motor 0/5 strength in LUE and BLE, some purposeful movement in RUE scratches nose but cannot follow commands to cooperate with MMT   Cerebellar exam unable to assess  Sensation withdraws to pinch BLE and Right index finger  Musculoskeletal: reduced bilateral shoulder and elbow ROM due to increased tone  RIght Elbow brace in place Right wrist swelling   Assessment/Plan: 1. Functional deficits which require 3+ hours per day of interdisciplinary therapy in a comprehensive inpatient rehab setting. Physiatrist is providing close team supervision and 24 hour management of active medical problems listed below. Physiatrist and rehab team continue to assess barriers to discharge/monitor patient  progress toward functional and medical goals  Care Tool:  Bathing    Body parts bathed by patient: Face   Body parts bathed by helper: Right arm, Left arm, Chest, Abdomen, Front perineal area, Buttocks, Right upper leg, Left upper leg, Right lower leg, Left lower leg, Face Body parts n/a: Right arm, Left arm, Front perineal area, Buttocks, Right upper leg, Left upper leg, Right lower leg, Left lower leg   Bathing assist Assist Level: Maximal Assistance - Patient 24 - 49%     Upper Body Dressing/Undressing Upper body dressing   What is the  patient wearing?: Pull over shirt    Upper body assist Assist Level: Total Assistance - Patient < 25%    Lower Body Dressing/Undressing Lower body dressing      What is the patient wearing?: Pants, Incontinence brief     Lower body assist Assist for lower body dressing: Dependent - Patient 0%     Toileting Toileting Toileting Activity did not occur (Clothing management and hygiene only): N/A (no void or bm)  Toileting assist Assist for toileting: Dependent - Patient 0%     Transfers Chair/bed transfer  Transfers assist  Chair/bed transfer activity did not occur: Safety/medical concerns  Chair/bed transfer assist level: Total Assistance - Patient < 25%     Locomotion Ambulation   Ambulation assist   Ambulation activity did not occur: Safety/medical concerns          Walk 10 feet activity   Assist  Walk 10 feet activity did not occur: Safety/medical concerns  Assist level: Dependent - Patient 0%     Walk 50 feet activity   Assist Walk 50 feet with 2 turns activity did not occur: Safety/medical concerns         Walk 150 feet activity   Assist Walk 150 feet activity did not occur: Safety/medical concerns         Walk 10 feet on uneven surface  activity   Assist Walk 10 feet on uneven surfaces activity did not occur: Safety/medical concerns         Wheelchair     Assist Is the patient using a wheelchair?: Yes Type of Wheelchair: Manual    Wheelchair assist level: Dependent - Patient 0%      Wheelchair 50 feet with 2 turns activity    Assist    Wheelchair 50 feet with 2 turns activity did not occur: Safety/medical concerns   Assist Level: Dependent - Patient 0%   Wheelchair 150 feet activity     Assist  Wheelchair 150 feet activity did not occur: Safety/medical concerns   Assist Level: Dependent - Patient 0%   Blood pressure (!) 129/94, pulse 99, temperature 97.7 F (36.5 C), temperature source Oral, resp.  rate 17, height 5\' 1"  (1.549 m), weight 45.6 kg, SpO2 99%.  Medical Problem List and Plan: 1. 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  Impaired mobility: continue Lovenox, weekly creatinine ordered             -antiplatelet therapy: N/A 3. Pain Management: Tylenol as needed  4. History of anxiety: discussed with her mom that she feels this was a big risk factor for her accident  5. Neuropsych/cognition: This patient is not capable of making decisions on her own behalf. 6. Left buttock unstageable ulcer.  continue Medihoney to buttock wound daily cover with foam dressing.  Changing dressing every 3 days or as needed soiling   7. Fluids/Electrolytes/Nutrition: Routine in and outs with follow-up chemistries  -Eating 100% most meals with total assist 8.  Cavitary right lower lobe pneumonia likely aspiration pneumonia/MRSA pneumonia.  Resolved    Latest Ref Rng & Units 12/20/2022   10:54 AM 12/11/2022    7:15 AM 12/03/2022    7:18 AM  CBC  WBC 4.0 - 10.5 K/uL 5.6  9.0  8.7   Hemoglobin 12.0 - 15.0 g/dL 46.9  62.9  52.8   Hematocrit 36.0 - 46.0 % 42.8  45.1  39.7   Platelets  150 - 400 K/uL 253  329  218      9.  History of drug abuse.  Positive cocaine on urine drug screen.  Provide counseling  10.  AKI/hypovolemia and ATN.  resolved    Latest Ref Rng & Units 01/12/2023    8:02 AM 01/05/2023    6:57 AM 12/29/2022    5:46 AM  BMP  Creatinine 0.44 - 1.00 mg/dL 4.13  2.44  0.10     11.  Mild transaminitis with rhabdomyolysis.  Both resolved  12.  Hypotension. Resolved, d/c midodrine  13. Impaired initiation: continue amantadine 100mg  BID  14. Suboptimal vitamin D: continue 1,000U D3 daily  15. Spasticity:   -discussed serial casting with OT and her mom  continue baclofen to 20mg  TID and tizanidine to 4mg  TID. Continue wrist, elbow brace, and PRAFOs. Ordered a night splint for LLE  16. Bowel and bladder incontinence: continue bowel and bladder program  -LBM 8/31      17. Left hand skin maceration: Eucerin ordered, discussed hand split with OT, conitnue  18. History of magnesium deficiency: magnesium 500mg  started HS    01/13/2023    8:21 PM 01/13/2023    2:28 PM 01/13/2023    5:19 AM  Vitals with BMI  Systolic 129 133 272  Diastolic 94 99 91  Pulse 99  82    19. History of elevated SBP: Increase tizanidine to 4mg  TID. Magnesium supplement added HS. Increase propanolol to BID -Normotensive, monitor  20. Area of redness between left thumb and index finger: continue foam dressing  21. Scalp eczema: selsun blue ordered, continue  22. Fatigue: improved, discussed with team may be secondary to anti-spasticity medications, B12 reviewed and is 335, in suboptimal range, will give 1,000U B12 injection 8/7, will order monthly as needed, metanx started, increase to BID, continue  23. Right wrist swelling: XR ordered and shows no acute fracture, shows 2.26mm ulnar variance, brace removed to given patient a break from it. Voltaren gel ordered prn for discomfort, continue, CT ordered and show no acute osseus injury  23. Dysphagia:  continue D3/thins, d/c  Ensure as she is eating meals well  24. MRSA nares positive: negative on repeat, precautions d/ced  25. Tachycardic: increase propanolol to BID.  Currently within normal limits.      LOS: 56 days A FACE TO FACE EVALUATION WAS PERFORMED  Nicole [name] 01/13/2023, 11:01 PM

## 2023-01-14 NOTE — Progress Notes (Signed)
PROGRESS NOTE   Subjective/Complaints: Some mild diastolic hypertension 106 this a.m., otherwise vital stable.  No events overnight. Eating 75 to 100% of meals with total assist. Medium bowel movement this a.m., incontinent.  On evaluation, patient looking to the right, will not make eye contact with provider or follow simple commands.  Does winced in pain with mobilization of left lower extremity.  Will reflexively grasp with right hand.  ROS: Limited due to cognitive/behavioral   Objective:   No results found. No results for input(s): "WBC", "HGB", "HCT", "PLT" in the last 72 hours.   Recent Labs    01/12/23 0802  CREATININE 0.63      Intake/Output Summary (Last 24 hours) at 01/14/2023 1308 Last data filed at 01/14/2023 0802 Gross per 24 hour  Intake 1440 ml  Output --  Net 1440 ml      Pressure Injury 10/31/22 Buttocks Left Unstageable - Full thickness tissue loss in which the base of the injury is covered by slough (yellow, tan, gray, green or brown) and/or eschar (tan, brown or black) in the wound bed. (Active)  10/31/22 2000  Location: Buttocks  Location Orientation: Left  Staging: Unstageable - Full thickness tissue loss in which the base of the injury is covered by slough (yellow, tan, gray, green or brown) and/or eschar (tan, brown or black) in the wound bed.  Wound Description (Comments):   Present on Admission: Yes     Pressure Injury 11/18/22 Toe (Comment  which one) Anterior;Right Deep Tissue Pressure Injury - Purple or maroon localized area of discolored intact skin or blood-filled blister due to damage of underlying soft tissue from pressure and/or shear. small are (Active)  11/18/22 1300  Location: Toe (Comment  which one)  Location Orientation: Anterior;Right  Staging: Deep Tissue Pressure Injury - Purple or maroon localized area of discolored intact skin or blood-filled blister due to damage of  underlying soft tissue from pressure and/or shear.  Wound Description (Comments): small area on right great toe metatarsal  Present on Admission: Yes    Physical Exam: Vital Signs Blood pressure (!) 143/106, pulse 84, temperature 98.4 F (36.9 C), temperature source Oral, resp. rate 18, height 5\' 1"  (1.549 m), weight 45.6 kg, SpO2 98%.  Gen: no distress, normal appearing, BMI 18.99 HEENT: oral mucosa pink and moist, NCAT Cardio: HR normal rate and rhythm Chest: normal effort, normal rate of breathing Abd: soft, non-distended Ext: no edema Psych: Nonverbal  Skin: Wounds not examined + Scaling dried skin on head present. stage 2 to right toe, area of redness between left thumb and index finger-covered in dressing, left buttock unstageable area not viewed  Neurologic: Minimal engagement on exam, tired.  Does not follow simple commands. Tone:  MAS 3 right elbow, MAS 3 knee flexors, and MAS 3 L>R  hip adductors  MAS 3 L elbow , MS 3-4 in wrist and finger flexors  + LUE WHO  Motor 0/5 strength in LUE and BLE, some purposeful movement in RUE ; reflexively grasps hand  Withdraws to pain in bilateral lower and right upper extremities. Musculoskeletal: reduced bilateral shoulder and elbow ROM due to increased tone  Right wrist swelling -Ace wrapped.  Assessment/Plan: 1. Functional deficits which require 3+ hours per day of interdisciplinary therapy in a comprehensive inpatient rehab setting. Physiatrist is providing close team supervision and 24 hour management of active medical problems listed below. Physiatrist and rehab team continue to assess barriers to discharge/monitor patient progress toward functional and medical goals  Care Tool:  Bathing    Body parts bathed by patient: Face   Body parts bathed by helper: Right arm, Left arm, Chest, Abdomen, Front perineal area, Buttocks, Right upper leg, Left upper leg, Right lower leg, Left lower leg, Face Body parts n/a: Right arm,  Left arm, Front perineal area, Buttocks, Right upper leg, Left upper leg, Right lower leg, Left lower leg   Bathing assist Assist Level: Maximal Assistance - Patient 24 - 49%     Upper Body Dressing/Undressing Upper body dressing   What is the patient wearing?: Pull over shirt    Upper body assist Assist Level: Total Assistance - Patient < 25%    Lower Body Dressing/Undressing Lower body dressing      What is the patient wearing?: Pants, Incontinence brief     Lower body assist Assist for lower body dressing: Dependent - Patient 0%     Toileting Toileting Toileting Activity did not occur (Clothing management and hygiene only): N/A (no void or bm)  Toileting assist Assist for toileting: Dependent - Patient 0%     Transfers Chair/bed transfer  Transfers assist  Chair/bed transfer activity did not occur: Safety/medical concerns  Chair/bed transfer assist level: Total Assistance - Patient < 25%     Locomotion Ambulation   Ambulation assist   Ambulation activity did not occur: Safety/medical concerns          Walk 10 feet activity   Assist  Walk 10 feet activity did not occur: Safety/medical concerns  Assist level: Dependent - Patient 0%     Walk 50 feet activity   Assist Walk 50 feet with 2 turns activity did not occur: Safety/medical concerns         Walk 150 feet activity   Assist Walk 150 feet activity did not occur: Safety/medical concerns         Walk 10 feet on uneven surface  activity   Assist Walk 10 feet on uneven surfaces activity did not occur: Safety/medical concerns         Wheelchair     Assist Is the patient using a wheelchair?: Yes Type of Wheelchair: Manual    Wheelchair assist level: Dependent - Patient 0%      Wheelchair 50 feet with 2 turns activity    Assist    Wheelchair 50 feet with 2 turns activity did not occur: Safety/medical concerns   Assist Level: Dependent - Patient 0%   Wheelchair  150 feet activity     Assist  Wheelchair 150 feet activity did not occur: Safety/medical concerns   Assist Level: Dependent - Patient 0%   Blood pressure (!) 143/106, pulse 84, temperature 98.4 F (36.9 C), temperature source Oral, resp. rate 18, height 5\' 1"  (1.549 m), weight 45.6 kg, SpO2 98%.  Medical Problem List and Plan: 1. 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  Impaired mobility: continue Lovenox, weekly creatinine ordered             -antiplatelet therapy: N/A 3. Pain Management: Tylenol as needed  4. History of anxiety: discussed with her mom that she feels this was a big risk factor for her accident  5. Neuropsych/cognition: This patient is not capable of making decisions on her own behalf. 6. Left buttock unstageable ulcer.  continue Medihoney to buttock wound daily cover with foam dressing.  Changing dressing every 3 days or as needed soiling   7. Fluids/Electrolytes/Nutrition: Routine in and outs with follow-up chemistries  -Eating 100% most meals with total assist  8.  Cavitary right lower lobe pneumonia likely aspiration pneumonia/MRSA pneumonia.   Resolved    Latest Ref Rng & Units 12/20/2022   10:54 AM 12/11/2022    7:15 AM 12/03/2022    7:18 AM  CBC  WBC 4.0 - 10.5 K/uL 5.6  9.0  8.7   Hemoglobin 12.0 - 15.0 g/dL 34.7  42.5  95.6   Hematocrit 36.0 - 46.0 % 42.8  45.1  39.7   Platelets 150 - 400 K/uL 253  329  218      9.  History of drug abuse.  Positive cocaine on urine drug screen.  Provide counseling  10.  AKI/hypovolemia and ATN.  resolved    Latest Ref Rng & Units 01/12/2023    8:02 AM 01/05/2023    6:57 AM 12/29/2022    5:46 AM  BMP  Creatinine 0.44 - 1.00 mg/dL 3.87  5.64  3.32     11.  Mild transaminitis with rhabdomyolysis.  Both resolved  12.  Hypotension. Resolved, d/c midodrine  13. Impaired initiation: continue amantadine 100mg  BID  14. Suboptimal vitamin D: continue 1,000U D3 daily  15. Spasticity:   -discussed serial casting with OT and her mom  - continue baclofen to 20mg  TID and tizanidine to 4mg  TID. Continue wrist, elbow brace, and PRAFOs. Ordered a night splint for LLE  16. Bowel and bladder incontinence: continue bowel and bladder program  -LBM 8/31; has been incontinent of both  17. Left hand skin maceration: Eucerin ordered, discussed hand split with OT, conitnue  18. History of magnesium deficiency: magnesium 500mg  started HS    01/14/2023    4:58 AM 01/13/2023    8:21 PM 01/13/2023    2:28 PM  Vitals with BMI  Systolic 143 129 951  Diastolic 106 94 99  Pulse 84 99     19. History of elevated SBP: Increase tizanidine to 4mg  TID. Magnesium supplement added HS. Increase propanolol to BID -Normotensive, monitor  20. Area of redness between left thumb and index finger: continue foam dressing  21. Scalp eczema: selsun blue ordered, continue  22. Fatigue: improved, discussed with team may be secondary to anti-spasticity medications, B12 reviewed and is 335, in suboptimal range, will give 1,000U B12 injection 8/7, will order monthly as needed, metanx started, increase to BID, continue  23.  Right wrist swelling: XR ordered and shows no acute fracture, shows 2.42mm ulnar variance, brace removed to given patient a break from it. Voltaren gel ordered prn for discomfort, continue, CT ordered and show no acute osseus injury  23. Dysphagia: continue D3/thins, d/c Ensure as she is eating meals well  24. MRSA nares positive: negative on repeat, precautions d/ced  25. Tachycardic: increase propanolol to BID.  Currently within normal limits -has remained 80s to 90s   LOS: 57 days A FACE TO FACE EVALUATION WAS PERFORMED  Nicole [name] 01/14/2023, 9:21 AM

## 2023-01-14 NOTE — Progress Notes (Signed)
Physical Therapy Session Note  Patient Details  Name: Nicole [name] MRN: [id number] Date of Birth: 1975/07/12  Today's Date: 01/13/2023 PT Individual Time:  1130-1200  PT Individual Time Calculation (min): 30 min  Short Term Goals: Week 6:  PT Short Term Goal 1 (Week 6): Pt will initiate bed mobility requiring no more than totalA. PT Short Term Goal 1 - Progress (Week 6): Progressing toward goal PT Short Term Goal 2 (Week 6): Pt will maintain sitting balance unsupported for up to 45 seconds PT Short Term Goal 2 - Progress (Week 6): Progressing toward goal PT Short Term Goal 3 (Week 6): Pt will demonstrate command follow for >10% of tasks. PT Short Term Goal 3 - Progress (Week 6): Progressing toward goal PT Short Term Goal 4 (Week 6): [name] RLE will demonstrate improved joint mobility into knee/ hip extension and ankle DF to neutral. PT Short Term Goal 4 - Progress (Week 6): Progressing toward goal Week 7:  PT Short Term Goal 1 (Week 7): Pt will initiate bed mobility requiring no more than totalA. PT Short Term Goal 2 (Week 7): Pt will maintain sitting balance unsupported for up to 45 seconds PT Short Term Goal 3 (Week 7): Pt will demonstrate command follow for >10% of tasks. PT Short Term Goal 4 (Week 7): [name] RLE will demonstrate improved joint mobility into knee/ hip extension and ankle DF to neutral.  Skilled Therapeutic Interventions/Progress Updates:  Pt seen for fitting of new delivery of dynamic night splints for BLE.   Patient partially sidelying to R side with BLE flexed and drawn up toward chest with cast donned to RUE and palm protector to L hand. . Head position is fully flexed forward with significant tone. Patient awake/ alert and demos turn of head to direction of therapist to maintain gaze briefly.    Patient with no indication of pain at start of session. Positioned bed flat in order to allow for gravitational assist into extension of body.    Attempt to engage pt  throughout session with conversation. Increased appropriate responses noted today with few head nods and attempts to answer vocally.    Therapeutic Activity/ Public librarian Slow repositioning of BLE into full extension and requiring manual PROM of RLE into full knee extension and work on R ankle into improved DF prior to fit of splints.  New dynamic night splints adjusted to pt's size for improved hold of ankles into DF for prolonged stretch for attempt to correct and prevent further contracture into PF. Educated NT on donning and request to pass along to night staff for appropriate LE positioning and use of splints.    Pt positioned in bed with flat pillow for improved neck extension positioning, dynamic ankle splints donned for DF positioning with increased stretch, RUE in cast and LUE with comfy splint donned  as well as palm guard with rolled washcloth in hand to improve finger positioning.   Sign posted above bed to maintain BLE in extension with no pillow use under knees.     Therapy Documentation Precautions:  Precautions Precautions: Fall Precaution Comments: Contact precautions & delayed processing Restrictions Weight Bearing Restrictions: No  Pain:  Discomfort noted with any joint movement initially, but increasing comfort with time.   Therapy/Group: Individual Therapy  Loel Dubonnet PT, DPT, CSRS 01/13/2023, 5:06 PM

## 2023-01-14 NOTE — Progress Notes (Signed)
Physical Therapy Session Note  Patient Details  Name: Nicole [name] MRN: [id number] Date of Birth: Feb 16, 1976  Today's Date: 01/14/2023 PT Individual Time:  0865-7846  PT Individual Time Calculation (min): 42 min  Short Term Goals: Week 6:  PT Short Term Goal 1 (Week 6): Pt will initiate bed mobility requiring no more than totalA. PT Short Term Goal 1 - Progress (Week 6): Progressing toward goal PT Short Term Goal 2 (Week 6): Pt will maintain sitting balance unsupported for up to 45 seconds PT Short Term Goal 2 - Progress (Week 6): Progressing toward goal PT Short Term Goal 3 (Week 6): Pt will demonstrate command follow for >10% of tasks. PT Short Term Goal 3 - Progress (Week 6): Progressing toward goal PT Short Term Goal 4 (Week 6): [name] RLE will demonstrate improved joint mobility into knee/ hip extension and ankle DF to neutral. PT Short Term Goal 4 - Progress (Week 6): Progressing toward goal Week 7:  PT Short Term Goal 1 (Week 7): Pt will initiate bed mobility requiring no more than totalA. PT Short Term Goal 2 (Week 7): Pt will maintain sitting balance unsupported for up to 45 seconds PT Short Term Goal 3 (Week 7): Pt will demonstrate command follow for >10% of tasks. PT Short Term Goal 4 (Week 7): [name] RLE will demonstrate improved joint mobility into knee/ hip extension and ankle DF to neutral.  Skilled Therapeutic Interventions/Progress Updates:  Prior to entrance, communication with nsg re: pt's COVID test earlier in morning. Nsg relates that test has just come back (-) and does not have to have a second test, so pt may come off precautions and leave room for therapy session.   Patient supine in bed on entrance to room. Patient alert and agreeable to PT session.   Patient with no indication of pain at start of session.  Therapeutic Activity: Bed Mobility: Pt performed supine > sit with TotA/ DEP. VC/ tc required for technique throughout. Transfers: Pt performed sit<>stand  transfers throughout session with TotA +2. Provided vc/ tc throughout for sequencing and technique.  Gait Training:  Pt ambulated 8' x1/ 12' x1 using 3-musketeer technique with TotA / DEP +2 and w/c follow for safety. No volitional movement in BLE and requires DEP for each LE into advancement, foot positioning, knee guard, forward tibial displacement with hip extension in stance phase. Some instances of holding head up briefly. Provided vc/ tc/ facilitation throughout for increased cognitive awareness and potential for improved processing. Final 10' x1 using +3 for add'l LB/ UB coordinated movement for improved whole body biomechanics during gait.   Patient seated upright in TIS w/c at end of session with brakes locked, belt alarm set, and all needs within reach.   Therapy Documentation Precautions:  Precautions Precautions: Fall Precaution Comments: Contact precautions & delayed processing Restrictions Weight Bearing Restrictions: No  Pain:  Discomfort noted during ambulation 2/2 to needed knee extension during stance phase to RLE against increased RLE flexor tone and start of contracture.  Therapy/Group: Individual Therapy  Loel Dubonnet PT, DPT, CSRS 01/12/2023, 6:58 PM

## 2023-01-14 NOTE — Progress Notes (Signed)
Physical Therapy Session Note  Patient Details  Name: Nicole [name] MRN: [id number] Date of Birth: 06-Jun-1975  Today's Date: 01/14/2023 PT Individual Time: 2542-7062 PT Individual Time Calculation (min): 39 min   Short Term Goals: Week 7:  PT Short Term Goal 1 (Week 7): Pt will initiate bed mobility requiring no more than totalA. PT Short Term Goal 2 (Week 7): Pt will maintain sitting balance unsupported for up to 45 seconds PT Short Term Goal 3 (Week 7): Pt will demonstrate command follow for >10% of tasks. PT Short Term Goal 4 (Week 7): [name] RLE will demonstrate improved joint mobility into knee/ hip extension and ankle DF to neutral.  Skilled Therapeutic Interventions/Progress Updates:      Pt lying in bed - has B adjustable night splints on with both legs fully extended (!!). Patient noted to be incontinent of BM - dependent assist for posterior pericare without initiation for bed mobility. Noted a stage 2 (nearing 3) sacral wound while cleaning - LPN notified via secure chat. Supine<>sitting EOB with +2 dependent assist, posterior lean sitting and no volitional effort to correct balance. Dependent assist for squat<>pivot transfer into TIS w/c and then patient positioned and supported with pillows. TIS reclined, safety belt alarm on, all needs met with LPN present at the bedside for handoff of care.   Therapy Documentation Precautions:  Precautions Precautions: Fall Precaution Comments: Contact precautions & delayed processing Restrictions Weight Bearing Restrictions: No General:      Therapy/Group: Individual Therapy  Maddix Kliewer P Shaneka Efaw PT 01/14/2023, 8:27 AM

## 2023-01-15 MED ORDER — TIZANIDINE HCL 4 MG PO TABS
6.0000 mg | ORAL_TABLET | Freq: Three times a day (TID) | ORAL | Status: DC
  Administered 2023-01-15 – 2023-01-27 (×36): 6 mg via ORAL
  Filled 2023-01-15 (×36): qty 2

## 2023-01-15 NOTE — Progress Notes (Signed)
Occupational Therapy Session Note  Patient Details  Name: Nicole [name] MRN: [id number] Date of Birth: [birth year]  Today's Date: 01/15/2023 OT Individual Time: 1123-1203 OT Individual Time Calculation (min): 40 min  and Today's Date: 01/15/2023 OT Missed Time: 5 Minutes Missed Time Reason: Other (comment) (OT ran over with previous pt)   Short Term Goals: Week 8: OT Short Term Goal 1 (Week 8): Patient will sit unsupported at edge of bed x 25 seconds in preparation for level surface transfer OT Short Term Goal 2 (Week 8): Patient will change orientation of toothbrush in mouth to clean left and right sides of teeth with mod cueing following hand over hand to initiate OT Short Term Goal 3 (Week 8): Patient will bring hand to food to spear food x 3 in meal, then bring fork and orient to mouth with min assist  Skilled Therapeutic Interventions/Progress Updates:    Patient up in TIS chair upon therapy arrival asleep with entire body in flexed position. Demonstrated signs of pain when right hand was mobilized during hand hygiene and during passive neck stretching during manual therapy. Monitored during session and repositioned pt for comfort.   Patient participated in skilled OT session focusing on manual therapy to address cervical ROM, decreased joint mobility, and fascial restrictions, proper use of orthosis for skin protection.   Pt noted to have increased skin moisture and redness on palm of left hand. Skin maceration noted in between middle and ring finger as well as ring and small finger and at palmar digital crease of 4th and 5th digit. OT provided total assist for hand hygiene including use of soap and water and thoroughly drying hand. Skin inspection completed after hygiene. Noted area on left thumb web space that may be due to skin thinning. Not convinced that it's bruising; possible pooling of blood to area.  With TIS chair fully reclined, myofascial release and manual stretching was completed  to primary cervical region to decrease fascial restriction and increase joint mobility in a pain free zone.  -P/ROM, right/left rotation, left lateral bend, 10X, dynamic and progressive within patient's pain tolerance.   Mother arrived at end of session.    Therapy Documentation Precautions:  Precautions Precautions: Fall Precaution Comments: Contact precautions & delayed processing Restrictions Weight Bearing Restrictions: No   Therapy/Group: Individual Therapy Limmie Patricia, OTR/L,CBIS  Supplemental OT - [facility] and [facility] Secure Chat Preferred   01/15/2023, 12:53 PM

## 2023-01-15 NOTE — Progress Notes (Signed)
Speech Language Pathology Daily Session Note  Patient Details  Name: Nicole [name] MRN: [id number] Date of Birth: [birth year]  Today's Date: 01/15/2023 SLP Individual Time: 1347-1430 SLP Individual Time Calculation (min): 43 min  Short Term Goals: Week 8: SLP Short Term Goal 1 (Week 8): Pt will present w/ appropriate response to functional stimuli (i.e. accept spoon, grasp basic item appropriately, response to tactile/auditory stimuli) 20% of the time w/ maxA multimodal cues SLP Short Term Goal 2 (Week 8): Patient will follow 1-step commands within a context in 20% of opportunities with Max A multimodal cues SLP Short Term Goal 3 (Week 8): Patient will utilize multimodal communication as needed to answer basic yes/no questions with modA in 20% of opportunities  Skilled Therapeutic Interventions: SLP conducted skilled therapy session targeting communication and cognitive goals. SLP received patient tilted in TIS wheelchair. Patient exhibited improved alertness throughout session and frequently moved head to assist in completion of personal care and therapy tasks. Patient required max to totalA to grasp basic items although only demonstrated one type of grip regardless of item presented. SLP provided hand over hand assist to utilize tissue, chapstick, colored pencil, cup with straw, hairbrush, and toothbrush throughout session. Patient appeared to exhibit discomfort when attempting to move her hand and grimaced when utilizing left hand to raise tissue to nose with assist. Patient moved head frequently during cup, toothbrush, hairbrush, and tissue tasks to close distance between hand and target location. SLP attempted to facilitate use of colored pencil via coloring task, however patient required hand over hand assist and total cues to complete. SLP placed pop toy in patient's hand and demonstrated popping technique, then provided hand over hand assist. Patient exhibited to pop a bubble x1 without hand over  hand assistance. Patient observed to vocalize x1 although meaning could not be discerned. Patient provided no answers to y/n questions this date. Patient was left in chair with call bell in reach and chair alarm set. SLP will continue to target goals per plan of care.      Pain Pain Assessment Pain Scale: Faces Faces Pain Scale: Hurts little more  Therapy/Group: Individual Therapy  [name], M.A., CCC-SLP  [name] 01/15/2023, 2:22 PM

## 2023-01-15 NOTE — Progress Notes (Signed)
Physical Therapy Session Note  Patient Details  Name: Nicole [name] MRN: [id number] Date of Birth: 03-Sep-1975  Today's Date: 01/15/2023 PT Individual Time: 9528-4132 + 4401-0272 PT Individual Time Calculation (min): 69 min  + 41 min  Short Term Goals: Week 7:  PT Short Term Goal 1 (Week 7): Pt will initiate bed mobility requiring no more than totalA. PT Short Term Goal 2 (Week 7): Pt will maintain sitting balance unsupported for up to 45 seconds PT Short Term Goal 3 (Week 7): Pt will demonstrate command follow for >10% of tasks. PT Short Term Goal 4 (Week 7): [name] RLE will demonstrate improved joint mobility into knee/ hip extension and ankle DF to neutral.  Skilled Therapeutic Interventions/Progress Updates:      1st session: Pt positioned in bed with knees/hips flexed, semi-sidelying on R, and significant R lateral cervical flexion. Focused first part of session on gentle, prolonged stretching for extension. No signs of pain other than from end range stretching. Skin check on L hand palm protector and her soft elbow cast - some mild redness and odor - washed with warm washcloth and soap for skin protection and hygiene. Noted some bruising from L palm protector in crease of thumb and index finger - will notify OT to make adjustments as needed. Assisted to EOB with totalA and then to w/c with dependent assist squat pivot transfer. Transported patient outdoors for change of scenery and increase stimulation. Worked on cervical stretching outdoors to promote extension and L lateral flexion. Returned upstairs to CIR floor and assisted onto mat table to address sitting balance, postural control, and improving general awareness. Sitting balance fluctuating b/w min to maxA depending on position, quick to lose balance and unable to regain once lost although appears to have awareness of losing her balance. She did verbalize once during session, saying "ow" when transitioning from supine to EOB at start of  session. Pt returned to her room, ended session with all needs met and positioned with pillows.     2nd session: Pt sitting in TIS w/c to start - transported patient to ortho rehab gym and assisted to mat table with +1 dependent squat pivot transfer. Assisted into supine position with +2 assist and worked on extension stretching in her legs as well as cervical extension/rotation/lateral flexion. Positioned patient into prone position on mat table with +2 dependent assist - pt tolerating prone position for ~2-3 minutes before becoming uncomfortable. Patient able to achieve near prone but would benefit from a pillow or bolster under hips due to her severe tightness. Returned to supine position with +2 assist and then back to her TIS w/c with dependent squat pivot. Returned to her room and patient supported with pillows at end of treatment, all needs met.    Therapy Documentation Precautions:  Precautions Precautions: Fall Precaution Comments: Contact precautions & delayed processing Restrictions Weight Bearing Restrictions: No General:     Therapy/Group: Individual Therapy  Orrin Brigham 01/15/2023, 7:46 AM

## 2023-01-15 NOTE — Progress Notes (Signed)
PROGRESS NOTE   Subjective/Complaints: Patient's chart reviewed- No issues reported overnight BP is elevated, will increase tizanidine to 6mg  TID since spasticity is also severe  ROS: Limited due to cognitive/behavioral   Objective:   No results found. No results for input(s): "WBC", "HGB", "HCT", "PLT" in the last 72 hours.   No results for input(s): "NA", "K", "CL", "CO2", "GLUCOSE", "BUN", "CREATININE", "CALCIUM" in the last 72 hours.     Intake/Output Summary (Last 24 hours) at 01/15/2023 1038 Last data filed at 01/15/2023 0802 Gross per 24 hour  Intake 710 ml  Output --  Net 710 ml      Pressure Injury 10/31/22 Buttocks Left Unstageable - Full thickness tissue loss in which the base of the injury is covered by slough (yellow, tan, gray, green or brown) and/or eschar (tan, brown or black) in the wound bed. (Active)  10/31/22 2000  Location: Buttocks  Location Orientation: Left  Staging: Unstageable - Full thickness tissue loss in which the base of the injury is covered by slough (yellow, tan, gray, green or brown) and/or eschar (tan, brown or black) in the wound bed.  Wound Description (Comments):   Present on Admission: Yes     Pressure Injury 11/18/22 Toe (Comment  which one) Anterior;Right Deep Tissue Pressure Injury - Purple or maroon localized area of discolored intact skin or blood-filled blister due to damage of underlying soft tissue from pressure and/or shear. small are (Active)  11/18/22 1300  Location: Toe (Comment  which one)  Location Orientation: Anterior;Right  Staging: Deep Tissue Pressure Injury - Purple or maroon localized area of discolored intact skin or blood-filled blister due to damage of underlying soft tissue from pressure and/or shear.  Wound Description (Comments): small area on right great toe metatarsal  Present on Admission: Yes    Physical Exam: Vital Signs Blood pressure (!)  158/98, pulse 96, temperature 98.4 F (36.9 C), resp. rate 18, height 5\' 1"  (1.549 m), weight 45.6 kg, SpO2 98%.  Gen: no distress, normal appearing, BMI 18.99 HEENT: oral mucosa pink and moist, NCAT Cardio: HR normal rate and rhythm Chest: normal effort, normal rate of breathing Abd: soft, non-distended Ext: no edema Psych: Nonverbal  Skin: Wounds not examined + Scaling dried skin on head present. stage 2 to right toe, area of redness between left thumb and index finger-covered in dressing, left buttock unstageable area not viewed  Neurologic: Minimal engagement on exam, tired.  Does not follow simple commands. Delayed processing Tone:  MAS 3 right elbow, MAS 3 knee flexors, and MAS 3 L>R  hip adductors  MAS 3 L elbow , MS 3-4 in wrist and finger flexors  + LUE WHO  Motor 0/5 strength in LUE and BLE, some purposeful movement in RUE ; reflexively grasps hand  Withdraws to pain in bilateral lower and right upper extremities. Musculoskeletal: reduced bilateral shoulder and elbow ROM due to increased tone  Right wrist swelling -Ace wrapped.   Assessment/Plan: 1. Functional deficits which require 3+ hours per day of interdisciplinary therapy in a comprehensive inpatient rehab setting. Physiatrist is providing close team supervision and 24 hour management of active medical problems listed below. Physiatrist and rehab team  continue to assess barriers to discharge/monitor patient progress toward functional and medical goals  Care Tool:  Bathing    Body parts bathed by patient: Face   Body parts bathed by helper: Right arm, Left arm, Chest, Abdomen, Front perineal area, Buttocks, Right upper leg, Left upper leg, Right lower leg, Left lower leg, Face Body parts n/a: Right arm, Left arm, Front perineal area, Buttocks, Right upper leg, Left upper leg, Right lower leg, Left lower leg   Bathing assist Assist Level: Maximal Assistance - Patient 24 - 49%     Upper Body  Dressing/Undressing Upper body dressing   What is the patient wearing?: Pull over shirt    Upper body assist Assist Level: Total Assistance - Patient < 25%    Lower Body Dressing/Undressing Lower body dressing      What is the patient wearing?: Pants, Incontinence brief     Lower body assist Assist for lower body dressing: Dependent - Patient 0%     Toileting Toileting Toileting Activity did not occur (Clothing management and hygiene only): N/A (no void or bm)  Toileting assist Assist for toileting: Dependent - Patient 0%     Transfers Chair/bed transfer  Transfers assist  Chair/bed transfer activity did not occur: Safety/medical concerns  Chair/bed transfer assist level: Total Assistance - Patient < 25%     Locomotion Ambulation   Ambulation assist   Ambulation activity did not occur: Safety/medical concerns          Walk 10 feet activity   Assist  Walk 10 feet activity did not occur: Safety/medical concerns  Assist level: Dependent - Patient 0%     Walk 50 feet activity   Assist Walk 50 feet with 2 turns activity did not occur: Safety/medical concerns         Walk 150 feet activity   Assist Walk 150 feet activity did not occur: Safety/medical concerns         Walk 10 feet on uneven surface  activity   Assist Walk 10 feet on uneven surfaces activity did not occur: Safety/medical concerns         Wheelchair     Assist Is the patient using a wheelchair?: Yes Type of Wheelchair: Manual    Wheelchair assist level: Dependent - Patient 0%      Wheelchair 50 feet with 2 turns activity    Assist    Wheelchair 50 feet with 2 turns activity did not occur: Safety/medical concerns   Assist Level: Dependent - Patient 0%   Wheelchair 150 feet activity     Assist  Wheelchair 150 feet activity did not occur: Safety/medical concerns   Assist Level: Dependent - Patient 0%   Blood pressure (!) 158/98, pulse 96,  temperature 98.4 F (36.9 C), resp. rate 18, height 5\' 1"  (1.549 m), weight 45.6 kg, SpO2 98%.  Medical Problem List and Plan: 1. 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  Impaired mobility: continue Lovenox, weekly creatinine ordered             -antiplatelet therapy: N/A 3. Pain Management: Tylenol as needed  4. History of anxiety: discussed with her mom that she feels this was a big risk factor for her accident  5. Neuropsych/cognition: This patient is not capable of making decisions on her own behalf. 6. Left buttock unstageable ulcer.  continue Medihoney to buttock wound daily cover with foam dressing.  Changing dressing every 3 days or as needed soiling   7. Fluids/Electrolytes/Nutrition: Routine in and outs with follow-up chemistries  -Eating 100% most meals with total assist  8.  Cavitary right lower lobe pneumonia likely aspiration pneumonia/MRSA pneumonia.  Resolved    Latest Ref Rng & Units 12/20/2022   10:54 AM 12/11/2022    7:15 AM 12/03/2022    7:18 AM  CBC  WBC 4.0 - 10.5 K/uL 5.6  9.0  8.7   Hemoglobin 12.0 - 15.0 g/dL 72.5  36.6  44.0   Hematocrit 36.0  - 46.0 % 42.8  45.1  39.7   Platelets 150 - 400 K/uL 253  329  218      9.  History of drug abuse.  Positive cocaine on urine drug screen.  Provide counseling  10.  AKI/hypovolemia and ATN.  resolved    Latest Ref Rng & Units 01/12/2023    8:02 AM 01/05/2023    6:57 AM 12/29/2022    5:46 AM  BMP  Creatinine 0.44 - 1.00 mg/dL 3.47  4.25  9.56     11.  Mild transaminitis with rhabdomyolysis.  Both resolved  12.  Hypotension. Resolved, d/c midodrine  13. Impaired initiation: continue amantadine 100mg  BID  14. Suboptimal vitamin D: continue 1,000U D3 daily  15. Spasticity:   -discussed serial casting with OT and her mom  - continue baclofen to 20mg  TID and tizanidine to 4mg  TID. Continue wrist, elbow brace, and PRAFOs. Ordered a night splint for LLE  16. Bowel and bladder incontinence: continue bowel and bladder program  -LBM 8/31; has been incontinent of both  17. Left hand skin maceration: Eucerin ordered, discussed hand split with OT, conitnue  18. History of magnesium deficiency: magnesium 500mg  started HS    01/15/2023   10:11 AM 01/15/2023    4:35 AM 01/14/2023    7:37 PM  Vitals with BMI  Systolic 158 161 387  Diastolic 98 102 91  Pulse 96 97 82    19. HTN: Increase tizanidine to 6mg  TID. Magnesium supplement added HS. Increase propanolol to BID -Normotensive, monitor  20. Area of redness between left thumb and index finger: continue foam dressing  21. Scalp eczema: selsun blue ordered, continue  22. Fatigue: improved, discussed with team may be secondary to anti-spasticity medications, B12 reviewed and is 335, in suboptimal range, will give 1,000U B12 injection 8/7, will order monthly as needed, metanx started, increase to BID, continue  23. Right wrist swelling: XR ordered and shows no acute fracture, shows 2.48mm ulnar variance, brace removed to given patient a break from it. Voltaren gel ordered prn for discomfort, continue, CT ordered and show no acute osseus  injury, continue ace wrap  23. Dysphagia: continue D3/thins, continue Ensure  24. MRSA nares positive: negative on repeat, precautions d/ced  25. Tachycardic: increase propanolol to BID.  Tizanidine increased to 6mg  TID   LOS: 58 days A FACE TO FACE EVALUATION WAS PERFORMED  Drema Pry Janna Oak 01/15/2023, 10:38 AM

## 2023-01-16 NOTE — Progress Notes (Signed)
PROGRESS NOTE   Subjective/Complaints: Elbow splint called into [facility] today No issues overnight BP well controlled   ROS: Limited due to cognitive/behavioral   Objective:   No results found. No results for input(s): "WBC", "HGB", "HCT", "PLT" in the last 72 hours.   No results for input(s): "NA", "K", "CL", "CO2", "GLUCOSE", "BUN", "CREATININE", "CALCIUM" in the last 72 hours.     Intake/Output Summary (Last 24 hours) at 01/16/2023 1336 Last data filed at 01/16/2023 1224 Gross per 24 hour  Intake 709 ml  Output --  Net 709 ml      Pressure Injury 10/31/22 Buttocks Left Unstageable - Full thickness tissue loss in which the base of the injury is covered by slough (yellow, tan, gray, green or brown) and/or eschar (tan, brown or black) in the wound bed. (Active)  10/31/22 2000  Location: Buttocks  Location Orientation: Left  Staging: Unstageable - Full thickness tissue loss in which the base of the injury is covered by slough (yellow, tan, gray, green or brown) and/or eschar (tan, brown or black) in the wound bed.  Wound Description (Comments):   Present on Admission: Yes     Pressure Injury 11/18/22 Toe (Comment  which one) Anterior;Right Deep Tissue Pressure Injury - Purple or maroon localized area of discolored intact skin or blood-filled blister due to damage of underlying soft tissue from pressure and/or shear. small are (Active)  11/18/22 1300  Location: Toe (Comment  which one)  Location Orientation: Anterior;Right  Staging: Deep Tissue Pressure Injury - Purple or maroon localized area of discolored intact skin or blood-filled blister due to damage of underlying soft tissue from pressure and/or shear.  Wound Description (Comments): small area on right great toe metatarsal  Present on Admission: Yes    Physical Exam: Vital Signs Blood pressure 123/88, pulse 99, temperature 97.7 F (36.5 C),  temperature source Oral, resp. rate 17, height 5\' 1"  (1.549 m), weight 45.6 kg, SpO2 95%.  Gen: no distress, normal appearing, BMI 18.99 HEENT: oral mucosa pink and moist, NCAT, impaired cervical range of motion Cardio: HR normal rate and rhythm Chest: normal effort, normal rate of breathing Abd: soft, non-distended Ext: no edema Psych: Nonverbal  Skin: + Scaling dried skin on head present. stage 2 to right toe, area of redness between left thumb and index finger-covered in dressing, left buttock unstageable area not viewed  Neurologic: Minimal engagement on exam, tired.  Does not follow simple commands. Delayed processing Tone:  MAS 3 right elbow, MAS 3 knee flexors, and MAS 3 L>R  hip adductors  MAS 3 L elbow , MS 3-4 in wrist and finger flexors  + LUE WHO  Motor 0/5 strength in LUE and BLE, some purposeful movement in RUE ; reflexively grasps hand  Withdraws to pain in bilateral lower and right upper extremities. Musculoskeletal: reduced bilateral shoulder and elbow ROM due to increased tone  Right wrist swelling -Ace wrapped.   Assessment/Plan: 1. Functional deficits which require 3+ hours per day of interdisciplinary therapy in a comprehensive inpatient rehab setting. Physiatrist is providing close team supervision and 24 hour management of active medical problems listed below. Physiatrist and rehab team continue to  assess barriers to discharge/monitor patient progress toward functional and medical goals  Care Tool:  Bathing    Body parts bathed by patient: Face   Body parts bathed by helper: Right arm, Left arm, Chest, Abdomen, Front perineal area, Buttocks, Right upper leg, Left upper leg, Right lower leg, Left lower leg, Face Body parts n/a: Right arm, Left arm, Front perineal area, Buttocks, Right upper leg, Left upper leg, Right lower leg, Left lower leg   Bathing assist Assist Level: Maximal Assistance - Patient 24 - 49%     Upper Body Dressing/Undressing Upper  body dressing   What is the patient wearing?: Pull over shirt    Upper body assist Assist Level: Total Assistance - Patient < 25%    Lower Body Dressing/Undressing Lower body dressing      What is the patient wearing?: Pants, Incontinence brief     Lower body assist Assist for lower body dressing: Dependent - Patient 0%     Toileting Toileting Toileting Activity did not occur (Clothing management and hygiene only): N/A (no void or bm)  Toileting assist Assist for toileting: Dependent - Patient 0%     Transfers Chair/bed transfer  Transfers assist  Chair/bed transfer activity did not occur: Safety/medical concerns  Chair/bed transfer assist level: Total Assistance - Patient < 25%     Locomotion Ambulation   Ambulation assist   Ambulation activity did not occur: Safety/medical concerns          Walk 10 feet activity   Assist  Walk 10 feet activity did not occur: Safety/medical concerns  Assist level: Dependent - Patient 0%     Walk 50 feet activity   Assist Walk 50 feet with 2 turns activity did not occur: Safety/medical concerns         Walk 150 feet activity   Assist Walk 150 feet activity did not occur: Safety/medical concerns         Walk 10 feet on uneven surface  activity   Assist Walk 10 feet on uneven surfaces activity did not occur: Safety/medical concerns         Wheelchair     Assist Is the patient using a wheelchair?: Yes Type of Wheelchair: Manual    Wheelchair assist level: Dependent - Patient 0%      Wheelchair 50 feet with 2 turns activity    Assist    Wheelchair 50 feet with 2 turns activity did not occur: Safety/medical concerns   Assist Level: Dependent - Patient 0%   Wheelchair 150 feet activity     Assist  Wheelchair 150 feet activity did not occur: Safety/medical concerns   Assist Level: Dependent - Patient 0%   Blood pressure 123/88, pulse 99, temperature 97.7 F (36.5 C),  temperature source Oral, resp. rate 17, height 5\' 1"  (1.549 m), weight 45.6 kg, SpO2 95%.  Medical Problem List and Plan: 1. 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  Impaired mobility: continue Lovenox, weekly creatinine ordered             -antiplatelet therapy: N/A 3. Pain Management: Tylenol as needed  4. History of anxiety: discussed with her mom that she feels this was a big risk factor for her accident  5. Neuropsych/cognition: This patient is not capable of making decisions on her own behalf. 6. Left buttock unstageable ulcer.  continue Medihoney to buttock wound daily cover with foam dressing.  Changing dressing every 3 days or as needed soiling   7. Fluids/Electrolytes/Nutrition: Routine in and outs with follow-up chemistries  -Eating 100% most meals with total assist  8.  Cavitary right lower lobe pneumonia likely aspiration pneumonia/MRSA pneumonia.  Resolved    Latest Ref Rng & Units 12/20/2022   10:54 AM 12/11/2022    7:15 AM 12/03/2022    7:18 AM  CBC  WBC 4.0 - 10.5 K/uL 5.6  9.0  8.7   Hemoglobin 12.0 - 15.0 g/dL 16.1  09.6  04.5   Hematocrit 36.0 - 46.0  % 42.8  45.1  39.7   Platelets 150 - 400 K/uL 253  329  218      9.  History of drug abuse.  Positive cocaine on urine drug screen.  Provide counseling  10.  AKI/hypovolemia and ATN.  resolved    Latest Ref Rng & Units 01/12/2023    8:02 AM 01/05/2023    6:57 AM 12/29/2022    5:46 AM  BMP  Creatinine 0.44 - 1.00 mg/dL 4.09  8.11  9.14     11.  Mild transaminitis with rhabdomyolysis.  Both resolved  12.  Hypotension. Resolved, d/c midodrine  13. Impaired initiation: continue amantadine 100mg  BID  14. Suboptimal vitamin D: continue 1,000U D3 daily  15. Spasticity:   -discussed serial casting with OT and her mom  - continue baclofen to 20mg  TID and tizanidine to 4mg  TID. Continue wrist, elbow brace, and PRAFOs. Ordered a night splint for LLE  16. Bowel and bladder incontinence: continue bowel and bladder program  -LBM 8/31; has been incontinent of both  17. Left hand skin maceration: Eucerin ordered, discussed hand split with OT, conitnue  18. History of magnesium deficiency: magnesium 500mg  started HS    01/16/2023   12:29 PM 01/16/2023    8:51 AM 01/16/2023    6:40 AM  Vitals with BMI  Systolic 123 146 782  Diastolic 88 89 95  Pulse 99 88 89    19. HTN: Increase tizanidine to 6mg  TID. Magnesium supplement added HS. Continue propanolol to BID -Normotensive, monitor  20. Area of redness between left thumb and index finger: continue foam dressing  21. Scalp eczema: selsun blue ordered, continue  22. Fatigue: improved, discussed with team may be secondary to anti-spasticity medications, B12 reviewed and is 335, in suboptimal range, will give 1,000U B12 injection 8/7, will order monthly as needed, metanx started, increase to BID, continue  23. Right wrist swelling: XR ordered and shows no acute fracture, shows 2.73mm ulnar variance, brace removed to given patient a break from it. Voltaren gel ordered prn for discomfort, continue, CT ordered and show no acute osseus injury,  continue ace wrap  23. Dysphagia: continue D3/thins, continue Ensure  24. MRSA nares positive: negative on repeat, precautions d/ced  25. Tachycardic: increase propanolol to BID.  Continue Tizanidine increased to 6mg  TID   LOS: 59 days A FACE TO FACE EVALUATION WAS PERFORMED  Clint Bolder P Leeandra Ellerson 01/16/2023, 1:36 PM

## 2023-01-16 NOTE — Progress Notes (Signed)
Physical Therapy Session Note  Patient Details  Name: Nicole [name] MRN: [id number] Date of Birth: 02-07-1976  Today's Date: 01/16/2023 PT Individual Time: 0803-0829 PT Individual Time Calculation (min): 26 min   Short Term Goals: Week 6:  PT Short Term Goal 1 (Week 6): Pt will initiate bed mobility requiring no more than totalA. PT Short Term Goal 1 - Progress (Week 6): Progressing toward goal PT Short Term Goal 2 (Week 6): Pt will maintain sitting balance unsupported for up to 45 seconds PT Short Term Goal 2 - Progress (Week 6): Progressing toward goal PT Short Term Goal 3 (Week 6): Pt will demonstrate command follow for >10% of tasks. PT Short Term Goal 3 - Progress (Week 6): Progressing toward goal PT Short Term Goal 4 (Week 6): [name] RLE will demonstrate improved joint mobility into knee/ hip extension and ankle DF to neutral. PT Short Term Goal 4 - Progress (Week 6): Progressing toward goal Week 7:  PT Short Term Goal 1 (Week 7): Pt will initiate bed mobility requiring no more than totalA. PT Short Term Goal 2 (Week 7): Pt will maintain sitting balance unsupported for up to 45 seconds PT Short Term Goal 3 (Week 7): Pt will demonstrate command follow for >10% of tasks. PT Short Term Goal 4 (Week 7): [name] RLE will demonstrate improved joint mobility into knee/ hip extension and ankle DF to neutral.   Skilled Therapeutic Interventions/Progress Updates:  Patient supine in bed with R elbow cast donned. Doffed while in bed to prep for session of ambulation. Head position is fully flexed forward in significant tone. Patient awake/ alert and demos turn of head to direction of therapist to maintain gaze briefly. Skilled +2 available in prep for ambulation.    Attempt to engage pt throughout session with conversation. Increased appropriate responses noted today with head nodding/ shaking and vocalizations of "ow-shit" with pain to joint movements/ WB to BLE and stating "yes/no" appropriately to a  few questions.    Therapeutic Activity/ NMR/ Manual Therapy Supine head/ neck stretch into full supine with attempt to bring occiput to bed surface with mild SCM pressure.   Attempt to engage pt all mobility but no volitional movement noted throughout.  Pt with noted UI in brief and bed mobility initiated to change brief and provide pericare. Pt instructed in required movements during rolling but continues to require DEP mobility in order to provide pericare and brief change dependently. Pants donned dependently.   Supine>sit DEP with attempt to have pt self correct into midline but without hip stabilization she exhibits LOB in all directions with no reactive, protective response. Shirt donned TotA with pt moving head to put through shirt.   Transfers: Pt performed squat pivot to w/c with DEP. Provided vc throughout for technique. Discomfort noted with WB to BLE d/t developing contractures.    NMR performed for improvements in motor control, cognition, and maintaining body mobility for future purposeful movements.   At end of session, pt's L elbow comfy brace donned following stretch into extension to max ROM. Pt seated upright in w/c with pillow prop to BUE for improved shoulder abduction. All needs within reach.   Therapy Documentation Precautions:  Precautions Precautions: Fall Precaution Comments: Contact precautions & delayed processing Restrictions Weight Bearing Restrictions: No  Pain:  Discomfort indicated with facial grimace in all joint ROM into max range. Vocalizations to increased BLE stretch into extension during ambulation.   Therapy/Group: Individual Therapy  Loel Dubonnet PT, DPT, CSRS 01/16/2023,  10:21 AM

## 2023-01-16 NOTE — Progress Notes (Signed)
Orthopedic Tech Progress Note Patient Details:  Nicole [name] 1976-03-08 [id number]  Called in order to [facility] for an ELBOW SPLINT   Patient ID: UROOJ SERENO, female   DOB: Apr 02, 1976, 47 y.o.   MRN: [id number]  [name] 01/16/2023, 8:20 AM

## 2023-01-16 NOTE — Progress Notes (Signed)
Occupational Therapy Session Note  Patient Details  Name: Nicole [name] MRN: [id number] Date of Birth: Mar 12, 1976  Today's Date: 01/16/2023 OT Individual Time: 0920-1000 OT Individual Time Calculation (min): 40 min    Short Term Goals: Week 8: OT Short Term Goal 1 (Week 8): Patient will sit unsupported at edge of bed x 25 seconds in preparation for level surface transfer OT Short Term Goal 2 (Week 8): Patient will change orientation of toothbrush in mouth to clean left and right sides of teeth with mod cueing following hand over hand to initiate OT Short Term Goal 3 (Week 8): Patient will bring hand to food to spear food x 3 in meal, then bring fork and orient to mouth with min assist  Skilled Therapeutic Interventions/Progress Updates:  Skilled OT intervention completed with focus on discomfort management, cervical positioning and muscular relaxation within a functional context. Pt received seated in TIS w/c. Very difficult to arouse/open eyes despite multimodal techniques with time needed. Upon arousal, pt aggressively scratching head with Rt hand, and very difficult to redirect. OT asked pt if her head was itching, and pt visually attended to OT, and when OT offered hair washing pt appeared to shake head "yes." No pain demonstrated during session as pt deeply calmed with the below activities.  Tilted TIS w/c to about 45 degrees. Pt with Rt cervical lateral flexion, with time needed to reduce tone and flexed cervical posture with manual techniques to achieve neutral position for relaxed placement on head rest for hair washing. OT played [employer] D[employer] music in background for increased relaxation which pt seemed to respond well to.  Unable to remove head rest, and hair wash tray unable to align properly at neck due to intermittent deviation to Rt side. Utilized trash bag over top of w/c head rest/at pt's shoulders and towels for hair washing with total A. Pt did fall asleep and remained at end of  session therefore after drying hair, OT placed rolled pillow case on Rt side of head at head rest and left TIS w/c slightly tilted about 70 degrees to accommodate safe and properly aligned neck while unattended.  Pt remained in TIS w/c, with belt alarm on/activated, and call bell placed in pt's lap at end of session.   Therapy Documentation Precautions:  Precautions Precautions: Fall Precaution Comments: Contact precautions & delayed processing Restrictions Weight Bearing Restrictions: No    Therapy/Group: Individual Therapy  Melvyn Novas, MS, OTR/L  01/16/2023, 10:36 AM

## 2023-01-16 NOTE — Progress Notes (Signed)
Physical Therapy Session Note  Patient Details  Name: Nicole [name] MRN: [id number] Date of Birth: 1976-04-07  Today's Date: 01/16/2023 PT Individual Time: 1435-1530 PT Individual Time Calculation (min): 55 min   Short Term Goals: Week 7:  PT Short Term Goal 1 (Week 7): Pt will initiate bed mobility requiring no more than totalA. PT Short Term Goal 2 (Week 7): Pt will maintain sitting balance unsupported for up to 45 seconds PT Short Term Goal 3 (Week 7): Pt will demonstrate command follow for >10% of tasks. PT Short Term Goal 4 (Week 7): [name] RLE will demonstrate improved joint mobility into knee/ hip extension and ankle DF to neutral.  Skilled Therapeutic Interventions/Progress Updates:      Pt presents sitting in TIS w/c - appears agreeable to therapy treatment. Remains mostly nonverbal but she later during session she does have some "mumbling" and soft spoken words (!!) but these were mostly unintelligible. Focused session on uitilizing Tilt Table to facilitate standing, weight bearing, and passive stretching, and upright tolerance. +2 dependent lift on flat tilt table. Patient tolerating up to near full tilt (!) on table and tried to use mirror to help with visual feedback and forward gaze. Manual facilitation needed for cervical extension while standing. B heels floating off table to start but progress to full weight bearing with knees extended with time and manual facilitation. Pt tolerating several minute stands and allowed supine rest breaks as needed. Tried to involve volitional movement while standing on tilt table but unable to consistently perform. Did get her to smile and soft laugh a few times during the session. Returned to [facility] w/c with +2 dependent lift and then transported back to her room. She was left fully reclined in TIS w/c to allow pressure relief on her sacrum. All needs met at end of treatment.    Therapy Documentation Precautions:  Precautions Precautions:  Fall Precaution Comments: Contact precautions & delayed processing Restrictions Weight Bearing Restrictions: No General:     Therapy/Group: Individual Therapy  Orrin Brigham 01/16/2023, 3:16 PM

## 2023-01-16 NOTE — Progress Notes (Signed)
Physical Therapy Session Note  Patient Details  Name: Nicole [name] MRN: [id number] Date of Birth: [birth year]  Today's Date: 01/16/2023 PT Individual Time: 0830-0900 PT Individual Time Calculation (min): 30 min   Short Term Goals: Week 7:  PT Short Term Goal 1 (Week 7): Pt will initiate bed mobility requiring no more than totalA. PT Short Term Goal 2 (Week 7): Pt will maintain sitting balance unsupported for up to 45 seconds PT Short Term Goal 3 (Week 7): Pt will demonstrate command follow for >10% of tasks. PT Short Term Goal 4 (Week 7): [name] RLE will demonstrate improved joint mobility into knee/ hip extension and ankle DF to neutral.  Skilled Therapeutic Interventions/Progress Updates:      Pt seen with +2 skilled assist to facilitate gait training for NMR. Pt without signs of pain, other than grimacing with stretching and mobility. Remains mostly nonverbal during treatment. NMR gait training using +3 assist (3rd person for wheelchair follow) and +2 skilled 3-muskateers for facilitate gait. Patient needing dependent assist for all mobility aspects of gait training for trunk support and advancing/managing BLE in gait cycle. Pt demonstrated significant forward trunk/hip/knee flexion, able to achieve full terminal extension on L during terminal stance, related to muscle shortening. Little to no carryover of function b/w 2 gait distances of ~62ft. Pt returned to her room and concluded session slightly reclined in TIS, pillows supporting BUE, all needs met.    Therapy Documentation Precautions:  Precautions Precautions: Fall Precaution Comments: Contact precautions & delayed processing Restrictions Weight Bearing Restrictions: No General:     Therapy/Group: Individual Therapy  Shaunta Oncale P Tiant Peixoto PT 01/16/2023, 7:41 AM

## 2023-01-16 NOTE — Progress Notes (Signed)
Speech Language Pathology Daily Session Note  Patient Details  Name: Nicole [name] MRN: [id number] Date of Birth: 10/17/1975  Today's Date: 01/16/2023 SLP Individual Time: 1345-1430 SLP Individual Time Calculation (min): 45 min  Short Term Goals: Week 8: SLP Short Term Goal 1 (Week 8): Pt will present w/ appropriate response to functional stimuli (i.e. accept spoon, grasp basic item appropriately, response to tactile/auditory stimuli) 20% of the time w/ maxA multimodal cues SLP Short Term Goal 2 (Week 8): Patient will follow 1-step commands within a context in 20% of opportunities with Max A multimodal cues SLP Short Term Goal 3 (Week 8): Patient will utilize multimodal communication as needed to answer basic yes/no questions with modA in 20% of opportunities  Skilled Therapeutic Interventions:   Pt greeted in her room, reclined in her TIS chair upon SLP arrival. SLP facilitated tx tasks targeting cognition and language. Initially, she was able to answer  ~60% of functional Y/N questions re wants/needs and environment w/ modA verbal cues. She continues to demonstrate increased difficulty nodding "no" vs "yes," so no response was interpreted as potential no. Similar success noted w/ biographical Y/N questions as well. Out of 8 trials, all questions not answered were "no" responses. Answered "y" correctly via head nod w/ only spv verbal cues. SLP provided pt w/ coca cola via straw. She was able to grasp the cup w/ moderate assistance from SLP to take single sips at a time. Noted to vocalize "mmm" during some "y" responses, though unable to voice/mouthe responses during automatic speech tasks 1-10, ABCs, or humming happy birthday. SLP also provided visual cue of her name in attempts to assist w/ spontaneous verbalizations. She was unable to read name aloud, though did demonstrate awareness of written information by responding "y" via head nod when asked if this was her name. She also utilized Y/N responses  to indicate understanding of functional words (speech therapy, [place], SLP's name). Overall, noted to laugh/smile x3 each. She was left in her chair with the alarm set and call light within reach at the end of her tx session. Recommend cont ST.   Pain  Unable to report pain - appeared comfortable  Therapy/Group: Individual Therapy  [name] 01/16/2023, 4:50 PM

## 2023-01-17 LAB — COMPREHENSIVE METABOLIC PANEL
ALT: 38 U/L (ref 0–44)
AST: 21 U/L (ref 15–41)
Albumin: 3.2 g/dL — ABNORMAL LOW (ref 3.5–5.0)
Alkaline Phosphatase: 53 U/L (ref 38–126)
Anion gap: 9 (ref 5–15)
BUN: 12 mg/dL (ref 6–20)
CO2: 27 mmol/L (ref 22–32)
Calcium: 9 mg/dL (ref 8.9–10.3)
Chloride: 102 mmol/L (ref 98–111)
Creatinine, Ser: 0.5 mg/dL (ref 0.44–1.00)
GFR, Estimated: >60 mL/min (ref >60)
Glucose, Bld: 102 mg/dL — ABNORMAL HIGH (ref 70–99)
Potassium: 3.8 mmol/L (ref 3.5–5.1)
Sodium: 138 mmol/L (ref 135–145)
Total Bilirubin: 0.7 mg/dL (ref 0.3–1.2)
Total Protein: 6.6 g/dL (ref 6.5–8.1)

## 2023-01-17 NOTE — Progress Notes (Signed)
Speech Language Pathology Daily Session Note  Patient Details  Name: Nicole [name] MRN: [id number] Date of Birth: 11/08/1975  Today's Date: 01/17/2023 SLP Individual Time: 1300-1345 SLP Individual Time Calculation (min): 45 min  Short Term Goals: Week 8: SLP Short Term Goal 1 (Week 8): Pt will present w/ appropriate response to functional stimuli (i.e. accept spoon, grasp basic item appropriately, response to tactile/auditory stimuli) 20% of the time w/ maxA multimodal cues SLP Short Term Goal 2 (Week 8): Patient will follow 1-step commands within a context in 20% of opportunities with Max A multimodal cues SLP Short Term Goal 3 (Week 8): Patient will utilize multimodal communication as needed to answer basic yes/no questions with modA in 20% of opportunities  Skilled Therapeutic Interventions:   Pt and her mom greeted at bedside. She was awake/alert in her TIS chair after noon meal. SLP facilitated functional tx tasks targeting cognition and expressive/receptive language. With additional time for processing and intermittent repetitions, she was able to answer functional Y/N questions re wants/needs and environment. SLP utilized errorless learning to provide orientation information, including time of day. Pt required maxA visual/verbal/tactile cues overall to follow one step commands (facial movements). She accurately completed 2/5 trials, though groping noted during 4/5 trials. After her mom left, SLP assisted pt outside via wc. She demonstrated increased interaction with her environment via eye closure/stillness during the breeze, squinting intermittently d/t brightness, and visual tracking/sustained gaze at people/plants. When SLP played pt preferred music, she was able to bop her head to the beat spontaneously. She audibly laughed multiple times throughout the tx session and smiled appropriately in response to things said by SLP. At the end of the tx session, SLP assisted pt back towards her room and  direct handoff was completed in the hallway w/ PT. Recommend cont ST per POC.   Pain Pain Assessment Pain Scale: Faces Faces Pain Scale: No hurt  Therapy/Group: Individual Therapy  [name] 01/17/2023, 4:19 PM

## 2023-01-17 NOTE — Progress Notes (Signed)
Physical Therapy Weekly Progress Note  Patient Details  Name: Nicole [name] MRN: [id number] Date of Birth: Nov 23, 1975  Beginning of progress report period: January 08, 2023 End of progress report period: January 17, 2023  Today's Date: 01/17/2023 PT Individual Time: 1350-1425 PT Individual Time Calculation (min): 35 min   Patient has met 0 of 4 short term goals.  [name] has made minimal functional progress in therapy but is starting to demonstrate slow progress towards cognitive awareness and attention. She continues to require dependent care for all functional mobility. Focusing therapy on managing deficits and progressing mobility.   Patient continues to demonstrate the following deficits muscle weakness and muscle joint tightness, decreased cardiorespiratoy endurance, impaired timing and sequencing, abnormal tone, and unbalanced muscle activation, decreased midline orientation and decreased motor planning, decreased initiation, decreased attention, decreased awareness, decreased problem solving, decreased safety awareness, decreased memory, delayed processing, and demonstrates behaviors consistent with Rancho Level 3, and decreased sitting balance, decreased standing balance, decreased postural control, and decreased balance strategies and therefore will continue to benefit from skilled PT intervention to increase functional independence with mobility.  Patient progressing toward long term goals..  Continue plan of care.  PT Short Term Goals Week 8: PT Short Term Goal 1 (Week 8): Pt will initiate bed mobility requiring no more than totalA. PT Short Term Goal 2 (Week 8): Pt will maintain sitting balance unsupported for up to 45 seconds PT Short Term Goal 3 (Week 8): Pt will demonstrate command follow for >10% of tasks. PT Short Term Goal 4 (Week 8): [name] RLE will demonstrate improved joint mobility into knee/ hip extension and ankle DF to neutral.   Skilled Therapeutic Interventions/Progress  Updates:      Direct handoff of care from SLP to start - pt in wheelchair and appearing agreeable to therapy treatment. Treatment focused on cervical stretching and weight bearing through BLE. Completed passive stretching for BLE to promote knee extension and ankle DF at wheelchair level. Cervical extension with TIS w/c full reclined to provide suboccipital release and passive cervical rotation to the L. +2 standing at high/low table with dependent/totalA for blocking B knees and promoting hip/trunk extension. Standing ~15 seconds at a time due to fatigue and level of assist required. Returned to her room at end of treatment with all needs met.    Therapy Documentation Precautions:  Precautions Precautions: Fall Precaution Comments: Contact precautions & delayed processing Restrictions Weight Bearing Restrictions: No General:     Therapy/Group: Individual Therapy  Davarius Ridener P Akiya Morr  PT, DPT, CSRS  01/17/2023, 7:50 AM

## 2023-01-17 NOTE — Progress Notes (Signed)
PROGRESS NOTE   Subjective/Complaints: Starting to have more vocalizations! No issues overnight BP stable this morning but was elevated last night  ROS: Limited due to cognitive/behavioral   Objective:   No results found. No results for input(s): "WBC", "HGB", "HCT", "PLT" in the last 72 hours.   No results for input(s): "NA", "K", "CL", "CO2", "GLUCOSE", "BUN", "CREATININE", "CALCIUM" in the last 72 hours.     Intake/Output Summary (Last 24 hours) at 01/17/2023 1104 Last data filed at 01/17/2023 0700 Gross per 24 hour  Intake 1056 ml  Output --  Net 1056 ml      Pressure Injury 10/31/22 Buttocks Left Unstageable - Full thickness tissue loss in which the base of the injury is covered by slough (yellow, tan, gray, green or brown) and/or eschar (tan, brown or black) in the wound bed. (Active)  10/31/22 2000  Location: Buttocks  Location Orientation: Left  Staging: Unstageable - Full thickness tissue loss in which the base of the injury is covered by slough (yellow, tan, gray, green or brown) and/or eschar (tan, brown or black) in the wound bed.  Wound Description (Comments):   Present on Admission: Yes     Pressure Injury 11/18/22 Toe (Comment  which one) Anterior;Right Deep Tissue Pressure Injury - Purple or maroon localized area of discolored intact skin or blood-filled blister due to damage of underlying soft tissue from pressure and/or shear. small are (Active)  11/18/22 1300  Location: Toe (Comment  which one)  Location Orientation: Anterior;Right  Staging: Deep Tissue Pressure Injury - Purple or maroon localized area of discolored intact skin or blood-filled blister due to damage of underlying soft tissue from pressure and/or shear.  Wound Description (Comments): small area on right great toe metatarsal  Present on Admission: Yes    Physical Exam: Vital Signs Blood pressure 101/78, pulse 89, temperature 98.8 F  (37.1 C), temperature source Oral, resp. rate 16, height 5\' 1"  (1.549 m), weight 45.6 kg, SpO2 99%.  Gen: no distress, normal appearing, BMI 18.99 HEENT: oral mucosa pink and moist, NCAT, impaired cervical range of motion Cardio: HR normal rate and rhythm Chest: normal effort, normal rate of breathing Abd: soft, non-distended Ext: no edema Psych: Increased vocalizations  Skin: + Scaling dried skin on head present. stage 2 to right toe, area of redness between left thumb and index finger-covered in dressing, left buttock unstageable area not viewed  Neurologic: Minimal engagement on exam, tired.  Does not follow simple commands. Delayed processing Tone:  MAS 3 right elbow, MAS 3 knee flexors, and MAS 3 L>R  hip adductors  MAS 3 L elbow , MS 3-4 in wrist and finger flexors  + LUE WHO  Motor 0/5 strength in LUE and BLE, some purposeful movement in RUE ; reflexively grasps hand  Withdraws to pain in bilateral lower and right upper extremities. Musculoskeletal: reduced bilateral shoulder and elbow ROM due to increased tone  Right wrist swelling -Ace wrapped.   Assessment/Plan: 1. Functional deficits which require 3+ hours per day of interdisciplinary therapy in a comprehensive inpatient rehab setting. Physiatrist is providing close team supervision and 24 hour management of active medical problems listed below. Physiatrist and rehab  team continue to assess barriers to discharge/monitor patient progress toward functional and medical goals  Care Tool:  Bathing    Body parts bathed by patient: Face   Body parts bathed by helper: Right arm, Left arm, Chest, Abdomen, Front perineal area, Buttocks, Right upper leg, Left upper leg, Right lower leg, Left lower leg, Face Body parts n/a: Right arm, Left arm, Front perineal area, Buttocks, Right upper leg, Left upper leg, Right lower leg, Left lower leg   Bathing assist Assist Level: Maximal Assistance - Patient 24 - 49%     Upper Body  Dressing/Undressing Upper body dressing   What is the patient wearing?: Pull over shirt    Upper body assist Assist Level: Total Assistance - Patient < 25%    Lower Body Dressing/Undressing Lower body dressing      What is the patient wearing?: Pants, Incontinence brief     Lower body assist Assist for lower body dressing: Dependent - Patient 0%     Toileting Toileting Toileting Activity did not occur (Clothing management and hygiene only): N/A (no void or bm)  Toileting assist Assist for toileting: Dependent - Patient 0%     Transfers Chair/bed transfer  Transfers assist  Chair/bed transfer activity did not occur: Safety/medical concerns  Chair/bed transfer assist level: Total Assistance - Patient < 25%     Locomotion Ambulation   Ambulation assist   Ambulation activity did not occur: Safety/medical concerns          Walk 10 feet activity   Assist  Walk 10 feet activity did not occur: Safety/medical concerns  Assist level: Dependent - Patient 0%     Walk 50 feet activity   Assist Walk 50 feet with 2 turns activity did not occur: Safety/medical concerns         Walk 150 feet activity   Assist Walk 150 feet activity did not occur: Safety/medical concerns         Walk 10 feet on uneven surface  activity   Assist Walk 10 feet on uneven surfaces activity did not occur: Safety/medical concerns         Wheelchair     Assist Is the patient using a wheelchair?: Yes Type of Wheelchair: Manual    Wheelchair assist level: Dependent - Patient 0%      Wheelchair 50 feet with 2 turns activity    Assist    Wheelchair 50 feet with 2 turns activity did not occur: Safety/medical concerns   Assist Level: Dependent - Patient 0%   Wheelchair 150 feet activity     Assist  Wheelchair 150 feet activity did not occur: Safety/medical concerns   Assist Level: Dependent - Patient 0%   Blood pressure 101/78, pulse 89, temperature  98.8 F (37.1 C), temperature source Oral, resp. rate 16, height 5\' 1"  (1.549 m), weight 45.6 kg, SpO2 99%.  Medical Problem List and Plan: 1. 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  Impaired mobility: continue Lovenox, weekly creatinine ordered             -antiplatelet therapy: N/A 3. Pain Management: Tylenol as needed  4. History of anxiety: discussed with her mom that she feels this was a big risk factor for her accident  5. Neuropsych/cognition: This patient is not capable of making decisions on her own behalf. 6. Left buttock unstageable ulcer.  continue Medihoney to buttock wound daily cover with foam dressing.  Changing dressing every 3 days or as needed soiling   7. Fluids/Electrolytes/Nutrition: Routine in and outs with follow-up chemistries  -Eating 100% most meals with total assist  8.  Cavitary right lower lobe pneumonia likely aspiration pneumonia/MRSA pneumonia.  Resolved    Latest Ref Rng & Units 12/20/2022   10:54 AM 12/11/2022    7:15 AM 12/03/2022    7:18 AM  CBC  WBC 4.0 - 10.5 K/uL 5.6  9.0  8.7   Hemoglobin 12.0 - 15.0 g/dL 78.2  95.6  21.3    Hematocrit 36.0 - 46.0 % 42.8  45.1  39.7   Platelets 150 - 400 K/uL 253  329  218      9.  History of drug abuse.  Positive cocaine on urine drug screen.  Provide counseling  10.  AKI/hypovolemia and ATN.  resolved    Latest Ref Rng & Units 01/12/2023    8:02 AM 01/05/2023    6:57 AM 12/29/2022    5:46 AM  BMP  Creatinine 0.44 - 1.00 mg/dL 0.86  5.78  4.69     11.  Mild transaminitis with rhabdomyolysis.  Both resolved  12.  Hypotension. Resolved, d/c midodrine  13. Impaired initiation: continue amantadine 100mg  BID  14. Suboptimal vitamin D: continue 1,000U D3 daily  15. Spasticity:   -discussed serial casting with OT and her mom  - continue baclofen to 20mg  TID and tizanidine to 4mg  TID. Continue wrist, elbow brace, and PRAFOs. Ordered a night splint for LLE  Repeat CMP today given chronic tizanidine use  16. Bowel and bladder incontinence: continue bowel and bladder program   17. Left hand skin maceration: Eucerin ordered, discussed hand split with OT, conitnue  18. History of magnesium deficiency: magnesium 500mg  started HS    01/17/2023    4:20 AM 01/16/2023    7:54 PM 01/16/2023   12:29 PM  Vitals with BMI  Systolic 101 138 629  Diastolic 78 95 88  Pulse 89 91 99    19. HTN: Increase tizanidine to 6mg  TID. Magnesium supplement added HS. Continue propanolol to BID -Normotensive, monitor  20. Area of redness between left thumb and index finger: continue foam dressing  21. Scalp eczema: selsun blue ordered, continue  22. Fatigue: improved, discussed with team may be secondary to anti-spasticity medications, B12 reviewed and is 335, in suboptimal range, will give 1,000U B12 injection 8/7, will order monthly as needed, metanx started, increase to BID, continue  23. Right wrist swelling: XR ordered and shows no acute fracture, shows 2.37mm ulnar variance, brace removed to given patient a break from it. Voltaren gel ordered prn for discomfort, continue, CT ordered and  show no acute osseus injury, continue ace wrap  23. Dysphagia: continue D3/thins, continue Ensure  24. MRSA nares positive: negative on repeat, precautions d/ced  25. Tachycardic: increase propanolol to BID.  Continue Tizanidine increased to 6mg  TID   LOS: 60 days A FACE TO FACE EVALUATION WAS PERFORMED  Farren Nelles P Aubryn Spinola 01/17/2023, 11:04 AM

## 2023-01-17 NOTE — Progress Notes (Signed)
Physical Therapy Session Note  Patient Details  Name: Nicole [name] MRN: [id number] Date of Birth: 1976/01/28  Today's Date: 01/17/2023 PT Individual Time: 0902-0935 PT Individual Time Calculation (min): 33 min   Short Term Goals: Week 6:  PT Short Term Goal 1 (Week 6): Pt will initiate bed mobility requiring no more than totalA. PT Short Term Goal 1 - Progress (Week 6): Progressing toward goal PT Short Term Goal 2 (Week 6): Pt will maintain sitting balance unsupported for up to 45 seconds PT Short Term Goal 2 - Progress (Week 6): Progressing toward goal PT Short Term Goal 3 (Week 6): Pt will demonstrate command follow for >10% of tasks. PT Short Term Goal 3 - Progress (Week 6): Progressing toward goal PT Short Term Goal 4 (Week 6): [name] RLE will demonstrate improved joint mobility into knee/ hip extension and ankle DF to neutral. PT Short Term Goal 4 - Progress (Week 6): Progressing toward goal Week 7:  PT Short Term Goal 1 (Week 7): Pt will initiate bed mobility requiring no more than totalA. PT Short Term Goal 2 (Week 7): Pt will maintain sitting balance unsupported for up to 45 seconds PT Short Term Goal 3 (Week 7): Pt will demonstrate command follow for >10% of tasks. PT Short Term Goal 4 (Week 7): [name] RLE will demonstrate improved joint mobility into knee/ hip extension and ankle DF to neutral. Week 8: PT Short Term Goal 1 (Week 8): Pt will initiate bed mobility requiring no more than totalA. PT Short Term Goal 2 (Week 8): Pt will maintain sitting balance unsupported for up to 45 seconds PT Short Term Goal 3 (Week 8): Pt will demonstrate command follow for >10% of tasks. PT Short Term Goal 4 (Week 8): [name] RLE will demonstrate improved joint mobility into knee/ hip extension and ankle DF to neutral.  Skilled Therapeutic Interventions/Progress Updates:  Patient seated upright in TIS w/c with L palm guard donned to L hand. Head position is fully flexed forward in significant tone.  Patient awake/ alert and demos turn of head to direction of therapist to maintain gaze.    Patient with no indication of pain at start of session.    Therapeutic Activity/ NMR/ Manual Therapy Pt positioned with forward tilt in w/c on arrival in day room. BLE positioned on seat of chair at table for positioning for sustained stretch for hamstrings. 7# weight draped over L knee for additional gravitational pull into knee extension. Longitudinal rotation to hip into ER then IR for improved ROM.   Attempt to engage pt in head control/ eye mvmt, conversation and provided with smartphone set up with coloring app to stimulate awareness and motor control using familiar device. Pt does touch R thumb to screen while holding phone in R hand. She requires ModA for required movements to manipulate image and then touch areas requiring "coloring".   Pt with increased awareness this session with verbalizations, appropriate laughter, appropriate head nods/ turns, acknowledgement of environment.   NMR performed for improvements in motor control, cognition, and maintaining body mobility for future purposeful movements.   All needs within reach. NT notified as to pt's disposition and potential need for personal care from potential UI/ BM.     Therapy Documentation Precautions:  Precautions Precautions: Fall Precaution Comments: Contact precautions & delayed processing Restrictions Weight Bearing Restrictions: No  Pain: No relation of pain this session.   Therapy/Group: Individual Therapy  Loel Dubonnet PT, DPT, CSRS 01/17/2023, 11:22 PM

## 2023-01-17 NOTE — Patient Care Conference (Signed)
Inpatient RehabilitationTeam Conference and Plan of Care Update Date: 01/17/2023   Time: 11:03 AM    Patient Name: Nicole [name]      Medical Record Number: [id number]  Date of Birth: 1975-09-11 Sex: Female         Room/Bed: 4W09C/4W09C-01 Payor Info: Payor: [employer] / Plan: [employer] / Product Type: *No Product type* /    Admit Date/Time:  11/18/2022  3:15 PM  Primary Diagnosis:  Hypoxic brain injury [facility])  Hospital Problems: Principal Problem:   Hypoxic brain injury (HCC) Active Problems:   Protein-calorie malnutrition, severe    Expected Discharge Date: Expected Discharge Date:  (Disability pending/SNF)  Team Members Present: Physician leading conference: Dr. Sula Soda Social Worker Present: Dossie Der, LCSW Nurse Present: Chana Bode, RN PT Present: Wynelle Link, PT OT Present: Bretta Bang, OT SLP Present: [name], SLP PPS Coordinator present : Edson Snowball, PT     Current Status/Progress Goal Weekly Team Focus  Bowel/Bladder   Incontinent of bowel and bladder   Regian some continence   Assist with toileting as needed    Swallow/Nutrition/ Hydration               ADL's   dependent/ max assist BADL.  Continues to show improved interaction - eye contact, head nodding, occasional attempts to verbalize, beginning to use R hand functionally   max assist   Localized response, participation in BADL, Postural/ head control    Mobility   continued dependent care for all functional mobility. Some efforts to vocalize "ow." Remains severely flexed in posture in all extremities. Makes eye contact. Now wearing bilateral dynamic night splints.  Still presents like Rancho 3?   TotA overall  Barriers: slow improvement in awareness, flexed posturing, contractures, sacral bed sores, incontinence.    Communication   maxA to answer structured Y/N questions via head nod, modA to answer functional questions - w/ responses noted ~50% of  the time. Remains difficult to nod "no." Able to follow functional commands re facial movements w/ maxA - continued groping noted. Spontaneous laughter, smiling, and vocalizations noted.   answer Y/N questions w/ modA 60% of the time   consistent Y/N responses, following commands re facial movements, automatic speech/voicing    Safety/Cognition/ Behavioral Observations  Success continues to remain variable day to day and even throughout the day, though increased eye tracking/general responsiveness to environment via facial movements/laughter/smiling continues. Still variable between maxA - totalA   consistent maxA   eye gaze/tracking, sustained attention, auditory stimuil, errorless learning for orientation, functional tasks to elicit responses/assist    Pain   Not showing signs of pain   Remain pain free   Assessing pain  q dhift and as needed    Skin   Stage 2 pressure ulcer to buttock   No new pressure ulcers  Continue assessing skin and dressing change      Discharge Planning:  Mom was appointed guardianship of pt-paperwork placed in soft chart. Continue to await SSD/SSI and medicaid determination. Can then place her once payment source obtained   Team Discussion: Patient continues on medication for spasms, tone and Amantadine.  Patient on target to meet rehab goals: Currently remains max - total assist for ADLs however demonstrating longer periods of eye contact and shakes head, nods to questions. Some un-intelligible vocalizations, spontaneously smiling and laughing. Needs max assist for following functional commands and mod assist for functional questions with max assist for response to yes/no questions.   *See  Care Plan and progress notes for long and short-term goals.   Revisions to Treatment Plan:  N/a   Teaching Needs: Safety, medications, skin care/wound care, transfers, toileting, etc.   Current Barriers to Discharge: Home enviroment access/layout, Incontinence,  Wound care, and Lack of/limited family support  Possible Resolutions to Barriers: Disability application pending     Medical Summary Current Status: spasticity, hypertension, skin abraisons, tachycardia  Barriers to Discharge: Medical stability;Complicated Wound  Barriers to Discharge Comments: spasticity, hypertension, skin abraisons, tachycardia Possible Resolutions to [employer]: continue baclofen 20mg  TID, increased tizanidine to 6mg  TID, continue bracing, monitor BP TID, continue propanolol, continue wound care   Continued Need for Acute Rehabilitation Level of Care: The patient requires daily medical management by a physician with specialized training in physical medicine and rehabilitation for the following reasons: Direction of a multidisciplinary physical rehabilitation program to maximize functional independence : Yes Medical management of patient stability for increased activity during participation in an intensive rehabilitation regime.: Yes Analysis of laboratory values and/or radiology reports with any subsequent need for medication adjustment and/or medical intervention. : Yes   I attest that I was present, lead the team conference, and concur with the assessment and plan of the team.   Chana Bode B 01/17/2023, 8:05 PM

## 2023-01-17 NOTE — Progress Notes (Signed)
Occupational Therapy Session Note  Patient Details  Name: Nicole [name] MRN: [id number] Date of Birth: 02-05-1976  Today's Date: 01/17/2023 OT Individual Time: 1430-1500 OT Individual Time Calculation (min): 30 min    Short Term Goals: Week 8: OT Short Term Goal 1 (Week 8): Patient will sit unsupported at edge of bed x 25 seconds in preparation for level surface transfer OT Short Term Goal 2 (Week 8): Patient will change orientation of toothbrush in mouth to clean left and right sides of teeth with mod cueing following hand over hand to initiate OT Short Term Goal 3 (Week 8): Patient will bring hand to food to spear food x 3 in meal, then bring fork and orient to mouth with min assist  Skilled Therapeutic Interventions/Progress Updates:    Patient received as direct hand off from PT - patient up in wheelchair.  Very realxed and eyes half closed, significantly reclined.  Assisted patient into more upright position and patient able to maintain relaxed but upright head position!  Patient with wet brief, so assisted to trasnfer via squat pivot and max assist to bed.  Assisted to supine with total assist.  Patient rolled left/right to remove brief, perform hygiene and replace brief.  Removed wet pants.  Patient allowed to remain in bed.  Applied braces to limbs.  R elbow cast/ L elbow brace, R/L PRAFO, and left with pillow between straight legs to reduce adduction.  Left hand with plam protector in place following gentle stretch which patient does not tolerate well - although relaxes into position immediately when stretching stops.   Left in bed - bed flat.  Call bell beside her.    Therapy Documentation Precautions:  Precautions Precautions: Fall Precaution Comments: Contact precautions & delayed processing Restrictions Weight Bearing Restrictions: No   Pain: Pain Assessment Pain Scale: Faces Faces Pain Scale: No hurt       Therapy/Group: Individual Therapy  [name] 01/17/2023, 4:03 PM

## 2023-01-17 NOTE — Progress Notes (Signed)
Occupational Therapy Session Note  Patient Details  Name: Nicole [name] MRN: [id number] Date of Birth: [birth year]  Today's Date: 01/17/2023 OT Individual Time: 7829-5621 OT Individual Time Calculation (min): 59 min    Short Term Goals: Week 8: OT Short Term Goal 1 (Week 8): Patient will sit unsupported at edge of bed x 25 seconds in preparation for level surface transfer OT Short Term Goal 2 (Week 8): Patient will change orientation of toothbrush in mouth to clean left and right sides of teeth with mod cueing following hand over hand to initiate OT Short Term Goal 3 (Week 8): Patient will bring hand to food to spear food x 3 in meal, then bring fork and orient to mouth with min assist  Skilled Therapeutic Interventions/Progress Updates:    Patient received supine in bed.  Assisted to roll to side lying and sit at edge of bed with max assist.  Patient now able to hold bed rail when rolling to sustain side lying position.  Patient transferred via squat pivot to shower chair and into shower.  Handed patient washcloth and directed her to wash - and she immediately brought cloth to her abdomen and washed self.  Brought her hand to hair when shampoo applied to hair as well.   Patient dried off and transferred back to bed to dress lower body.  Then back to chair to dress upper body.  Patient grasped one shirt from choice of two.  Patient handed hair brush and brushed her hair multiple strokes.  Patient sat up with lap tray in place and assisted to feed herself breakfast.  Patient unable to spear food, but once on fork, able to consistently bring hand to mouth.  Left up in wheelchair with tray table and safety belt in place.  Personal items nearby.    Therapy Documentation Precautions:  Precautions Precautions: Fall Precaution Comments: Contact precautions & delayed processing Restrictions Weight Bearing Restrictions: No   Pain: Pain Assessment Pain Scale: Faces Faces Pain Scale: No  hurt     Therapy/Group: Individual Therapy  [name] 01/17/2023, 3:56 PM

## 2023-01-18 LAB — GLUCOSE, CAPILLARY: Glucose-Capillary: 103 mg/dL — ABNORMAL HIGH (ref 70–99)

## 2023-01-18 MED ORDER — PROPRANOLOL HCL 20 MG PO TABS
10.0000 mg | ORAL_TABLET | Freq: Three times a day (TID) | ORAL | Status: DC
  Administered 2023-01-18 – 2023-02-08 (×61): 10 mg via ORAL
  Filled 2023-01-18 (×63): qty 1

## 2023-01-18 NOTE — Progress Notes (Signed)
PROGRESS NOTE   Subjective/Complaints: No new complaints this morning No issues overnight CMP reviewed and stable Flowsheet reviewed and BP elevated  ROS: Limited due to cognitive/behavioral   Objective:   No results found. No results for input(s): "WBC", "HGB", "HCT", "PLT" in the last 72 hours.   Recent Labs    01/17/23 1159  NA 138  K 3.8  CL 102  CO2 27  GLUCOSE 102*  BUN 12  CREATININE 0.50  CALCIUM 9.0       Intake/Output Summary (Last 24 hours) at 01/18/2023 1111 Last data filed at 01/18/2023 1610 Gross per 24 hour  Intake 794 ml  Output --  Net 794 ml      Pressure Injury 10/31/22 Buttocks Left Unstageable - Full thickness tissue loss in which the base of the injury is covered by slough (yellow, tan, gray, green or brown) and/or eschar (tan, brown or black) in the wound bed. (Active)  10/31/22 2000  Location: Buttocks  Location Orientation: Left  Staging: Unstageable - Full thickness tissue loss in which the base of the injury is covered by slough (yellow, tan, gray, green or brown) and/or eschar (tan, brown or black) in the wound bed.  Wound Description (Comments):   Present on Admission: Yes     Pressure Injury 11/18/22 Toe (Comment  which one) Anterior;Right Deep Tissue Pressure Injury - Purple or maroon localized area of discolored intact skin or blood-filled blister due to damage of underlying soft tissue from pressure and/or shear. small are (Active)  11/18/22 1300  Location: Toe (Comment  which one)  Location Orientation: Anterior;Right  Staging: Deep Tissue Pressure Injury - Purple or maroon localized area of discolored intact skin or blood-filled blister due to damage of underlying soft tissue from pressure and/or shear.  Wound Description (Comments): small area on right great toe metatarsal  Present on Admission: Yes    Physical Exam: Vital Signs Blood pressure 124/84, pulse 93,  temperature 98.3 F (36.8 C), temperature source Oral, resp. rate 16, height 5\' 1"  (1.549 m), weight 45.6 kg, SpO2 100%.  Gen: no distress, normal appearing, BMI 18.99 HEENT: oral mucosa pink and moist, NCAT, impaired cervical range of motion Cardio: HR normal rate and rhythm Chest: normal effort, normal rate of breathing Abd: soft, non-distended Ext: no edema Psych: Increased vocalizations, mostly nonsensical  Skin: + Scaling dried skin on head present. stage 2 to right toe, area of redness between left thumb and index finger-covered in dressing, left buttock unstageable area not viewed  Neurologic: Minimal engagement on exam, tired.  Does not follow simple commands. Delayed processing Tone:  MAS 3 right elbow, MAS 3 knee flexors, and MAS 3 L>R  hip adductors  MAS 3 L elbow , MS 3-4 in wrist and finger flexors  + LUE WHO  Motor 0/5 strength in LUE and BLE, some purposeful movement in RUE ; reflexively grasps hand  Withdraws to pain in bilateral lower and right upper extremities. Musculoskeletal: reduced bilateral shoulder and elbow ROM due to increased tone  Right wrist swelling -Ace wrapped.   Assessment/Plan: 1. Functional deficits which require 3+ hours per day of interdisciplinary therapy in a comprehensive inpatient rehab setting. Physiatrist  is providing close team supervision and 24 hour management of active medical problems listed below. Physiatrist and rehab team continue to assess barriers to discharge/monitor patient progress toward functional and medical goals  Care Tool:  Bathing    Body parts bathed by patient: Face   Body parts bathed by helper: Right arm, Left arm, Chest, Abdomen, Front perineal area, Buttocks, Right upper leg, Left upper leg, Right lower leg, Left lower leg, Face Body parts n/a: Right arm, Left arm, Front perineal area, Buttocks, Right upper leg, Left upper leg, Right lower leg, Left lower leg   Bathing assist Assist Level: Maximal Assistance  - Patient 24 - 49%     Upper Body Dressing/Undressing Upper body dressing   What is the patient wearing?: Pull over shirt    Upper body assist Assist Level: Total Assistance - Patient < 25%    Lower Body Dressing/Undressing Lower body dressing      What is the patient wearing?: Pants, Incontinence brief     Lower body assist Assist for lower body dressing: Dependent - Patient 0%     Toileting Toileting Toileting Activity did not occur (Clothing management and hygiene only): N/A (no void or bm)  Toileting assist Assist for toileting: Dependent - Patient 0%     Transfers Chair/bed transfer  Transfers assist  Chair/bed transfer activity did not occur: Safety/medical concerns  Chair/bed transfer assist level: Total Assistance - Patient < 25%     Locomotion Ambulation   Ambulation assist   Ambulation activity did not occur: Safety/medical concerns          Walk 10 feet activity   Assist  Walk 10 feet activity did not occur: Safety/medical concerns  Assist level: Dependent - Patient 0%     Walk 50 feet activity   Assist Walk 50 feet with 2 turns activity did not occur: Safety/medical concerns         Walk 150 feet activity   Assist Walk 150 feet activity did not occur: Safety/medical concerns         Walk 10 feet on uneven surface  activity   Assist Walk 10 feet on uneven surfaces activity did not occur: Safety/medical concerns         Wheelchair     Assist Is the patient using a wheelchair?: Yes Type of Wheelchair: Manual    Wheelchair assist level: Dependent - Patient 0%      Wheelchair 50 feet with 2 turns activity    Assist    Wheelchair 50 feet with 2 turns activity did not occur: Safety/medical concerns   Assist Level: Dependent - Patient 0%   Wheelchair 150 feet activity     Assist  Wheelchair 150 feet activity did not occur: Safety/medical concerns   Assist Level: Dependent - Patient 0%   Blood  pressure 124/84, pulse 93, temperature 98.3 F (36.8 C), temperature source Oral, resp. rate 16, height 5\' 1"  (1.549 m), weight 45.6 kg, SpO2 100%.  Medical Problem List and Plan: 1. 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  Impaired mobility: continue Lovenox, weekly creatinine ordered             -antiplatelet therapy: N/A 3. Pain Management: Tylenol as needed  4. History of anxiety: discussed with her mom that she feels this was a big risk factor for her accident  5. Neuropsych/cognition: This patient is not capable of making decisions on her own behalf. 6. Left buttock unstageable ulcer.  continue Medihoney to buttock wound daily cover with foam dressing.  Changing dressing every 3 days or as needed soiling   7. Fluids/Electrolytes/Nutrition: Routine in and outs with follow-up chemistries  -Eating 100% most meals with total assist  8.  Cavitary right lower lobe pneumonia likely aspiration pneumonia/MRSA pneumonia.  Resolved    Latest Ref Rng & Units 12/20/2022   10:54 AM 12/11/2022    7:15 AM 12/03/2022    7:18 AM  CBC  WBC 4.0 - 10.5 K/uL 5.6  9.0  8.7   Hemoglobin  12.0 - 15.0 g/dL 16.1  09.6  04.5   Hematocrit 36.0 - 46.0 % 42.8  45.1  39.7   Platelets 150 - 400 K/uL 253  329  218      9.  History of drug abuse.  Positive cocaine on urine drug screen.  Provide counseling  10.  AKI/hypovolemia and ATN.  resolved    Latest Ref Rng & Units 01/17/2023   11:59 AM 01/12/2023    8:02 AM 01/05/2023    6:57 AM  BMP  Glucose 70 - 99 mg/dL 409     BUN 6 - 20 mg/dL 12     Creatinine 8.11 - 1.00 mg/dL 9.14  7.82  9.56   Sodium 135 - 145 mmol/L 138     Potassium 3.5 - 5.1 mmol/L 3.8     Chloride 98 - 111 mmol/L 102     CO2 22 - 32 mmol/L 27     Calcium 8.9 - 10.3 mg/dL 9.0       11.  Mild transaminitis with rhabdomyolysis.  Both resolved  12.  Hypotension. Resolved, d/c midodrine  13. Impaired initiation: continue amantadine 100mg  BID  14. Suboptimal vitamin D: continue 1,000U D3 daily  15. Spasticity:   -discussed serial casting with OT and her mom  - continue baclofen to 20mg  TID and tizanidine to 4mg  TID. Continue wrist, elbow brace, and PRAFOs. Ordered a night splint for LLE  Repeat CMP today given chronic tizanidine use  16. Bowel and bladder incontinence: continue bowel and bladder program   17. Left hand skin maceration: Eucerin ordered, discussed hand split with OT, conitnue  18. History of magnesium deficiency: magnesium 500mg  started HS    01/18/2023    5:00 AM 01/17/2023    9:01 PM 01/17/2023    4:20 AM  Vitals with BMI  Systolic 124 150 213  Diastolic 84 98 78  Pulse 93 108 89    19. HTN: Increase tizanidine to 6mg  TID. Magnesium supplement added HS. Increase propanolol to 10mg  TID  20. Area of redness between left thumb and  index finger: continue foam dressing  21. Scalp eczema: selsun blue ordered, continue  22. Fatigue: improved, discussed with team may be secondary to anti-spasticity medications, B12 reviewed and is 335, in suboptimal range, will give 1,000U B12 injection 8/7, will order monthly as needed, metanx started,  increase to BID, continue  23. Right wrist swelling: XR ordered and shows no acute fracture, shows 2.47mm ulnar variance, brace removed to given patient a break from it. Voltaren gel ordered prn for discomfort, continue, CT ordered and show no acute osseus injury, continue ace wrap  23. Dysphagia: continue D3/thins, continue Ensure  24. MRSA nares positive: negative on repeat, precautions d/ced  25. Tachycardic: increase propanolol to BID.  Continue Tizanidine increased to 6mg  TID   LOS: 61 days A FACE TO FACE EVALUATION WAS PERFORMED  Nastacia Raybuck P Kaevion Sinclair 01/18/2023, 11:11 AM

## 2023-01-18 NOTE — Progress Notes (Signed)
Physical Therapy Session Note  Patient Details  Name: Nicole [name] MRN: [id number] Date of Birth: [birth year]  Today's Date: 01/18/2023 PT Individual Time: 4098-1191 PT Individual Time Calculation (min): 57 min   Short Term Goals: Week 8: PT Short Term Goal 1 (Week 8): Pt will initiate bed mobility requiring no more than totalA. PT Short Term Goal 2 (Week 8): Pt will maintain sitting balance unsupported for up to 45 seconds PT Short Term Goal 3 (Week 8): Pt will demonstrate command follow for >10% of tasks. PT Short Term Goal 4 (Week 8): [name] RLE will demonstrate improved joint mobility into knee/ hip extension and ankle DF to neutral.  Skilled Therapeutic Interventions/Progress Updates:      Pt received reclined in TIS w/c. No vocalizations during therapy session today. Transported to day room rehab gym and assisted patient to tilt table via +2 dependent transfer. Setup on Tilt table to encourage weight bearing through BLE and stretch posterior chain. Pt tolerating near full tilt on the table and PT facilitate TKE on both legs. In standing, worked on cervical stretching for extension/rotation, and BUE stretching for extension, abduction, and external rotation. Pt returned to her w/c in similar assist and returned to her room, fully reclined in TIS w/c for sacral offloading. All needs met.    Therapy Documentation Precautions:  Precautions Precautions: Fall Precaution Comments: Contact precautions & delayed processing Restrictions Weight Bearing Restrictions: No General:      Therapy/Group: Individual Therapy  Orrin Brigham 01/18/2023, 7:51 AM

## 2023-01-18 NOTE — Progress Notes (Signed)
Occupational Therapy Weekly Progress Note  Patient Details  Name: Nicole [name] MRN: [id number] Date of Birth: Dec 01, 1975  Beginning of progress report period: January 10, 2023 End of progress report period: January 18, 2023  Today's Date: 01/18/2023 OT Individual Time: 1324-4010 OT Individual Time Calculation (min): 75 min    Patient has met 1 of 3 short term goals.  Slow progres sbut improved eye contact, sustained attention, and response to familiar stimulus  Patient continues to demonstrate the following deficits: muscle joint tightness, impaired timing and sequencing, abnormal tone, unbalanced muscle activation, decreased coordination, and decreased motor planning, decreased visual motor skills, decreased midline orientation and decreased motor planning, decreased initiation, decreased attention, decreased awareness, decreased problem solving, decreased safety awareness, decreased memory, and delayed processing and therefore will continue to benefit from skilled OT intervention to enhance overall performance with BADL and Reduce care partner burden.  Patient progressing toward long term goals..  Continue plan of care.  OT Short Term Goals Week 8: OT Short Term Goal 1 (Week 8): Patient will sit unsupported at edge of bed x 25 seconds in preparation for level surface transfer OT Short Term Goal 1 - Progress (Week 8): Met OT Short Term Goal 2 (Week 8): Patient will change orientation of toothbrush in mouth to clean left and right sides of teeth with mod cueing following hand over hand to initiate OT Short Term Goal 2 - Progress (Week 8): Progressing toward goal OT Short Term Goal 3 (Week 8): Patient will bring hand to food to spear food x 3 in meal, then bring fork and orient to mouth with min assist OT Short Term Goal 3 - Progress (Week 8): Progressing toward goal Week 9: OT Short Term Goal 1 (Week 9): Patient will change orientation of toothbrush/ hairbrush to accomodate task with min  assist after set up OT Short Term Goal 2 (Week 9): Patient will spear food x 3 once utensil in hand, then bring to mouth with only contact assistance  Skilled Therapeutic Interventions/Progress Updates:    Patient received supine in bed - rapidly blinking eyes.  Removed braces from all four limbs.  Patient handed warm wet washcloth, and told to wipe her eyes and she did which seemed to resolve issue.  Patient handed clean washcloth and directed to wash aspects of face - automatically washed chin, then instructed to wash forehead - touching forehead as directing patient - patient completed, same process to wash left/right cheeks.   When donning compression stockings on patient, patient's leg jumped.  When asked - "are you ticklish?"  Patient clearly said - "not really."  Patient assisted to roll left and right to change into clean and dry brief.  Patient had formed BM, and wet brief.  Patient assisted into pants while in bed then transitioned to sitting for squat pivot transfer with max assist.  While sitting in chair - worked on sit to stand with support at knees/shins.  Patient says "ouch" initially with transition, but nothing further.  Assisted patient to brush her hair.  Patient's right hand continued to reach up and get tangled in hair so put hair up in ponytail.  Patient left up in wheelchair with safety belt in place and engaged and call bell in lap.  Lap tray in place.    Therapy Documentation Precautions:  Precautions Precautions: Fall Precaution Comments: Contact precautions & delayed processing Restrictions Weight Bearing Restrictions: No  Pain: Pain Assessment Pain Scale: Faces Pain Score: 0-No pain  Therapy/Group: Individual Therapy  [name] 01/18/2023, 12:41 PM

## 2023-01-18 NOTE — Plan of Care (Signed)
Continue care plan

## 2023-01-18 NOTE — Progress Notes (Signed)
Speech Language Pathology Weekly Progress and Session Note  Patient Details  Name: Nicole [name] MRN: [id number] Date of Birth: 1975/10/27  Beginning of progress report period: January 10, 2023 End of progress report period: January 18, 2023  Today's Date: 01/18/2023 SLP Individual Time: 1430-1500 SLP Individual Time Calculation (min): 30 min  Short Term Goals: Week 8: SLP Short Term Goal 1 (Week 8): Pt will present w/ appropriate response to functional stimuli (i.e. accept spoon, grasp basic item appropriately, response to tactile/auditory stimuli) 20% of the time w/ maxA multimodal cues SLP Short Term Goal 1 - Progress (Week 8): Not met SLP Short Term Goal 2 (Week 8): Patient will follow 1-step commands within a context in 20% of opportunities with Max A multimodal cues SLP Short Term Goal 2 - Progress (Week 8): Met SLP Short Term Goal 3 (Week 8): Patient will utilize multimodal communication as needed to answer basic yes/no questions with modA in 20% of opportunities SLP Short Term Goal 3 - Progress (Week 8): Met    New Short Term Goals: Week 9: SLP Short Term Goal 1 (Week 9): Pt will present w/ appropriate response to functional stimuli (i.e. accept spoon, grasp basic item appropriately, response to tactile/auditory stimuli) 20% of the time w/ maxA multimodal cues SLP Short Term Goal 2 (Week 9): Patient will follow 1-step commands within a context in 25% of opportunities with Max A multimodal cues SLP Short Term Goal 3 (Week 9): Patient will utilize multimodal communication as needed to answer basic yes/no questions with modA in 30% of opportunities  Weekly Progress Updates:  Pt demonstrated good progress this week, as she met 2/3 STGs. She demonstrates slightly improved consistency following very functional commands and answering Y/N questions. She continues to demonstrate improved overall responsiveness to her environment via increased eye contact/tracking, facial reactions  (laughing/grimace), and appropriate laughter. However, success continues to vary overall and she would benefit from continued ST services to target remaining severe cognitive-linguistic deficits. Additionally, pt/family education ongoing. Recommend cont ST per POC to maximize pt independence/communication and reduce caregiver burden.    Intensity: Minumum of 1-2 x/day, 30 to 90 minutes Frequency: 1 to 3 out of 7 days Duration/Length of Stay: awaiting SNF placement Treatment/Interventions: Cognitive remediation/compensation;Dysphagia/aspiration precaution training;Internal/external aids;Speech/Language facilitation;Cueing hierarchy;Environmental controls;Therapeutic Activities;Functional tasks;Multimodal communication approach;Patient/family education;Therapeutic Exercise   Daily Session  Skilled Therapeutic Interventions: Pt greeted at bedside. She was awake/alert upon SLP arrival. SLP facilitated tx tasks targeting cognition and expression. During initial conversation, she was able to nod Y/N to answer questions re wants/needs and daily events thus far. Responses noted ~25% of the time w/ modA verbal/visual cues. Frequent short cognitive rest breaks required today d/t patient fatigue. Suspect OT tx session prior to ST session resulted in increased fatigue. She was also provided w/ written words during diagnostic tx task targeting functional reading comprehension. She was able to nod Y/N to ID word after presented w/ audible choices via SLP. She benefited from maxA visual/verbal cues to complete 4 trials, though only correctly identified her first/last name. She was left in her bed with the alarm set and call light within reach. [name] nurse present upon SLP departure as well. Recommend cont ST per POC.  Pain  Unable to report pain - appeared comfortable  Therapy/Group: Individual Therapy  [name] 01/18/2023, 3:31 PM

## 2023-01-18 NOTE — Progress Notes (Signed)
Patient ID: Nicole [name], female   DOB: 1975-10-18, 47 y.o.   MRN: [id number]  Have left message for Medicaid worker Nicholas Lose to check the status of the medicaid and will await return call. Also spoke with [place] to discuss non news yet. Will call her once hear from [facility]. Mom here yesterday and updated regarding team conference.

## 2023-01-18 NOTE — Progress Notes (Signed)
Nutrition Follow-up  DOCUMENTATION CODES:   Severe malnutrition in context of acute illness/injury  INTERVENTION:  - Continue Dys 3, thin liquid diet.    - Continue Ensure Enlive po BID, each supplement provides 350 kcal and 20 grams of protein.  NUTRITION DIAGNOSIS:   Severe Malnutrition related to acute illness as evidenced by moderate fat depletion, moderate muscle depletion.  GOAL:   Patient will meet greater than or equal to 90% of their needs - Met   MONITOR:   PO intake, Supplement acceptance  REASON FOR ASSESSMENT:   Malnutrition Screening Tool    ASSESSMENT:   47 y.o. 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 PMH includes: anxiety, asthma, depression.  Meds reviewed: Vit C, Vit D3, magnesium gluconate, MVI. Labs reviewed: WDL.   Pt is now oriented x4 but verbal speech is still limited. Pt continues with good PO intakes. Per record, pt has been eating 75-100% of her meals over the past week. Pt also has Ensure BID ordered. Per Meds Hx record, pt has been drinking about 1 per day. Pt is meeting her needs. RD will continue to monitor.   Diet Order:   Diet Order             DIET DYS 3 Room service appropriate? Yes; Fluid consistency: Thin  Diet effective now                   EDUCATION NEEDS:   Not appropriate for education at this time  Skin:  Skin Assessment: Skin Integrity Issues: Skin Integrity Issues:: Unstageable Unstageable: L buttocks  Last BM:  9/2  Height:   Ht Readings from Last 1 Encounters:  11/18/22 5\' 1"  (1.549 m)    Weight:   Wt Readings from Last 1 Encounters:  01/11/23 45.6 kg    Ideal Body Weight:     BMI:  Body mass index is 18.99 kg/m.  Estimated Nutritional Needs:   Kcal:  1600-1800  Protein:  80-100 gm  Fluid:  >/= 1.6 L  Bethann Humble, RD, LDN, CNSC.

## 2023-01-18 NOTE — Progress Notes (Signed)
Occupational Therapy Session Note  Patient Details  Name: Nicole [name] MRN: [id number] Date of Birth: Jul 20, 1975  Today's Date: 01/18/2023 OT Individual Time: 1333-1430 OT Individual Time Calculation (min): 57 min    Short Term Goals: Week 8: OT Short Term Goal 1 (Week 8): Patient will sit unsupported at edge of bed x 25 seconds in preparation for level surface transfer OT Short Term Goal 1 - Progress (Week 8): Met OT Short Term Goal 2 (Week 8): Patient will change orientation of toothbrush in mouth to clean left and right sides of teeth with mod cueing following hand over hand to initiate OT Short Term Goal 2 - Progress (Week 8): Progressing toward goal OT Short Term Goal 3 (Week 8): Patient will bring hand to food to spear food x 3 in meal, then bring fork and orient to mouth with min assist OT Short Term Goal 3 - Progress (Week 8): Progressing toward goal Week 9: OT Short Term Goal 1 (Week 9): Patient will change orientation of toothbrush/ hairbrush to accomodate task with min assist after set up OT Short Term Goal 2 (Week 9): Patient will spear food x 3 once utensil in hand, then bring to mouth with only contact assistance  Skilled Therapeutic Interventions/Progress Updates:    Patient received seated in wheelchair.  Patient transported to therapy gym. Transferred patient into straight chair to assess whether or not she had sufficient postural control to maintain upright without reclining features of tilt in space chair.  Patient able to sit without support in straight chair with arms.  Retrieved a 16x16 wheelchair and transferred patient.  Patient's feet able to touch the ground.  Demonstrated for patient how she could use legs to propel chair.   Noticed patient was wet, and took her back to room to change her.  Patient trasnferred back to bed via squat pivot.  Patient rolled left and right to clean and apply dry brief.   Patient left supine in bed.  New elbow comfy brace arrived during this  session.   Applied L/R elbow braces, and L/R dynamic foot braces, with BLE in extension, bed flat, her head on pillow and palm protector in Left hand.  Placed pillow between legs to reduce adduction.  Direct hand off to NT.    Therapy Documentation Precautions:  Precautions Precautions: Fall Precaution Comments: Contact precautions & delayed processing Restrictions Weight Bearing Restrictions: No  Pain:  No pain when resting in wheelchair - patient smiling pleasantly.        Therapy/Group: Individual Therapy  [name] 01/18/2023, 3:02 PM

## 2023-01-19 LAB — CREATININE, SERUM
Creatinine, Ser: 0.58 mg/dL (ref 0.44–1.00)
GFR, Estimated: >60 mL/min (ref >60)

## 2023-01-19 MED ORDER — AMANTADINE HCL 50 MG/5ML PO SOLN
100.0000 mg | Freq: Every day | ORAL | Status: DC
  Administered 2023-01-20 – 2023-01-21 (×2): 100 mg via ORAL
  Filled 2023-01-19 (×2): qty 10

## 2023-01-19 NOTE — Progress Notes (Signed)
Speech Language Pathology Daily Session Note  Patient Details  Name: Nicole [name] MRN: [id number] Date of Birth: 1976/04/13  Today's Date: 01/19/2023 SLP Individual Time: 7829-5621 SLP Individual Time Calculation (min): 52 min  Short Term Goals: Week 9: SLP Short Term Goal 1 (Week 9): Pt will present w/ appropriate response to functional stimuli (i.e. accept spoon, grasp basic item appropriately, response to tactile/auditory stimuli) 20% of the time w/ maxA multimodal cues SLP Short Term Goal 2 (Week 9): Patient will follow 1-step commands within a context in 25% of opportunities with Max A multimodal cues SLP Short Term Goal 3 (Week 9): Patient will utilize multimodal communication as needed to answer basic yes/no questions with modA in 30% of opportunities  Skilled Therapeutic Interventions:   Pt greeted at bedside. She was awake/alert in her wc upon SLP arrival. SLP facilitated a variety of tx tasks targeting cognition and language. She was able to answer Y/N re wants and needs throughout tx session 80% of the time w/ modA visual/verbal cues and additional processing time. When provided w/ cup, she was able to grasp (and maintain grasp) spontaneously. She continues to smile and laugh appropriately in conversation. Additionally, noted to voice "yeah" in response to a question and spontaneously produce an unintelligible sentence. She was able to read SLP's badge and sign outside w/ modA verbal cues. At the end of the tx session, she responded to tactile stimuli (hand hold) by squeezing SLP's hand. She was left in her chair with the alarm set and call light within reach. Nursing notified of completion of therapy for the day and SLP requested their assistance to transfer pt back to bed. Recommend cont ST per POC.   Pain Pain Assessment Pain Scale: Faces Faces Pain Scale: No hurt  Therapy/Group: Individual Therapy  [name] 01/19/2023, 5:04 PM

## 2023-01-19 NOTE — Progress Notes (Signed)
PROGRESS NOTE   Subjective/Complaints: No new complaints this morning Denies pain Tolerated SLP today No issues overnight  ROS: Limited due to cognitive/behavioral   Objective:   No results found. No results for input(s): "WBC", "HGB", "HCT", "PLT" in the last 72 hours.   Recent Labs    01/17/23 1159 01/19/23 0637  NA 138  --   K 3.8  --   CL 102  --   CO2 27  --   GLUCOSE 102*  --   BUN 12  --   CREATININE 0.50 0.58  CALCIUM 9.0  --        Intake/Output Summary (Last 24 hours) at 01/19/2023 1108 Last data filed at 01/19/2023 1044 Gross per 24 hour  Intake 1592 ml  Output --  Net 1592 ml      Pressure Injury 10/31/22 Buttocks Left Unstageable - Full thickness tissue loss in which the base of the injury is covered by slough (yellow, tan, gray, green or brown) and/or eschar (tan, brown or black) in the wound bed. (Active)  10/31/22 2000  Location: Buttocks  Location Orientation: Left  Staging: Unstageable - Full thickness tissue loss in which the base of the injury is covered by slough (yellow, tan, gray, green or brown) and/or eschar (tan, brown or black) in the wound bed.  Wound Description (Comments):   Present on Admission: Yes     Pressure Injury 11/18/22 Toe (Comment  which one) Anterior;Right Deep Tissue Pressure Injury - Purple or maroon localized area of discolored intact skin or blood-filled blister due to damage of underlying soft tissue from pressure and/or shear. small are (Active)  11/18/22 1300  Location: Toe (Comment  which one)  Location Orientation: Anterior;Right  Staging: Deep Tissue Pressure Injury - Purple or maroon localized area of discolored intact skin or blood-filled blister due to damage of underlying soft tissue from pressure and/or shear.  Wound Description (Comments): small area on right great toe metatarsal  Present on Admission: Yes    Physical Exam: Vital Signs Blood  pressure (!) 178/146, pulse 100, temperature 98.1 F (36.7 C), temperature source Oral, resp. rate 16, height 5\' 1"  (1.549 m), weight 45.6 kg, SpO2 100%.  Gen: no distress, normal appearing, BMI 18.99 HEENT: oral mucosa pink and moist, NCAT, impaired cervical range of motion Cardio: HR normal rate and rhythm Chest: normal effort, normal rate of breathing Abd: soft, non-distended Ext: no edema Psych: Increased vocalizations, mostly nonsensical  Skin: + Scaling dried skin on head present. stage 2 to right toe, area of redness between left thumb and index finger-covered in dressing, left buttock unstageable area not viewed  Neurologic: Minimal engagement on exam, tired.  Does not follow simple commands. Delayed processing Tone:  MAS 3 right elbow, MAS 3 knee flexors, and MAS 3 L>R  hip adductors  MAS 3 L elbow , MS 3-4 in wrist and finger flexors  + LUE WHO  Motor 0/5 strength in LUE and BLE, some purposeful movement in RUE ; reflexively grasps hand  Withdraws to pain in bilateral lower and right upper extremities. Musculoskeletal: reduced bilateral shoulder and elbow ROM due to increased tone  Right wrist swelling -Ace wrapped.  Assessment/Plan: 1. Functional deficits which require 3+ hours per day of interdisciplinary therapy in a comprehensive inpatient rehab setting. Physiatrist is providing close team supervision and 24 hour management of active medical problems listed below. Physiatrist and rehab team continue to assess barriers to discharge/monitor patient progress toward functional and medical goals  Care Tool:  Bathing    Body parts bathed by patient: Face, Abdomen, Chest   Body parts bathed by helper: Right arm, Left arm, Front perineal area, Buttocks, Right upper leg, Left upper leg, Right lower leg, Left lower leg Body parts n/a: Right arm, Left arm, Front perineal area, Buttocks, Right upper leg, Left upper leg, Right lower leg, Left lower leg   Bathing assist  Assist Level: Maximal Assistance - Patient 24 - 49%     Upper Body Dressing/Undressing Upper body dressing   What is the patient wearing?: Pull over shirt    Upper body assist Assist Level: Total Assistance - Patient < 25%    Lower Body Dressing/Undressing Lower body dressing      What is the patient wearing?: Pants, Incontinence brief     Lower body assist Assist for lower body dressing: Dependent - Patient 0%     Toileting Toileting Toileting Activity did not occur (Clothing management and hygiene only): N/A (no void or bm)  Toileting assist Assist for toileting: Dependent - Patient 0%     Transfers Chair/bed transfer  Transfers assist  Chair/bed transfer activity did not occur: Safety/medical concerns  Chair/bed transfer assist level: Total Assistance - Patient < 25%     Locomotion Ambulation   Ambulation assist   Ambulation activity did not occur: Safety/medical concerns          Walk 10 feet activity   Assist  Walk 10 feet activity did not occur: Safety/medical concerns  Assist level: Dependent - Patient 0%     Walk 50 feet activity   Assist Walk 50 feet with 2 turns activity did not occur: Safety/medical concerns         Walk 150 feet activity   Assist Walk 150 feet activity did not occur: Safety/medical concerns         Walk 10 feet on uneven surface  activity   Assist Walk 10 feet on uneven surfaces activity did not occur: Safety/medical concerns         Wheelchair     Assist Is the patient using a wheelchair?: Yes Type of Wheelchair: Manual    Wheelchair assist level: Dependent - Patient 0%      Wheelchair 50 feet with 2 turns activity    Assist    Wheelchair 50 feet with 2 turns activity did not occur: Safety/medical concerns   Assist Level: Dependent - Patient 0%   Wheelchair 150 feet activity     Assist  Wheelchair 150 feet activity did not occur: Safety/medical concerns   Assist Level:  Dependent - Patient 0%   Blood pressure (!) 178/146, pulse 100, temperature 98.1 F (36.7 C), temperature source Oral, resp. rate 16, height 5\' 1"  (1.549 m), weight 45.6 kg, SpO2 100%.  Medical Problem List and Plan: 1. 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  Impaired mobility: continue Lovenox, weekly creatinine ordered             -antiplatelet therapy: N/A 3. Pain Management: Tylenol as needed  4. History of anxiety: discussed with her mom that she feels this was a big risk factor for her accident  5. Neuropsych/cognition: This patient is not capable of making decisions on her own behalf. 6. Left buttock unstageable ulcer.  continue Medihoney to buttock wound daily cover with foam dressing.  Changing dressing every 3 days or as needed soiling   7. Fluids/Electrolytes/Nutrition: Routine in and outs with follow-up chemistries  -Eating 100% most meals with total assist  8.  Cavitary right lower lobe pneumonia likely aspiration pneumonia/MRSA pneumonia.  Resolved    Latest Ref Rng & Units 12/20/2022   10:54 AM 12/11/2022    7:15 AM 12/03/2022    7:18 AM  CBC  WBC 4.0 -  10.5 K/uL 5.6  9.0  8.7   Hemoglobin 12.0 - 15.0 g/dL 78.2  95.6  21.3   Hematocrit 36.0 - 46.0 % 42.8  45.1  39.7   Platelets 150 - 400 K/uL 253  329  218      9.  History of drug abuse.  Positive cocaine on urine drug screen.  Provide counseling  10.  AKI/hypovolemia and ATN.  resolved    Latest Ref Rng & Units 01/19/2023    6:37 AM 01/17/2023   11:59 AM 01/12/2023    8:02 AM  BMP  Glucose 70 - 99 mg/dL  086    BUN 6 - 20 mg/dL  12    Creatinine 5.78 - 1.00 mg/dL 4.69  6.29  5.28   Sodium 135 - 145 mmol/L  138    Potassium 3.5 - 5.1 mmol/L  3.8    Chloride 98 - 111 mmol/L  102    CO2 22 - 32 mmol/L  27    Calcium 8.9 - 10.3 mg/dL  9.0      11.  Mild transaminitis with rhabdomyolysis.  Both resolved  12.  Hypotension. Resolved, d/c midodrine  13. Impaired initiation: continue amantadine 100mg  BID  14. Suboptimal vitamin D: continue 1,000U D3 daily  15. Spasticity:   -discussed serial casting with OT and her mom  - continue baclofen to 20mg  TID and tizanidine to 4mg  TID. Continue wrist, elbow brace, and PRAFOs. Ordered a night splint for LLE  Repeat CMP today given chronic tizanidine use  16. Bowel and bladder incontinence: continue bowel and bladder program   17. Left hand skin maceration: Eucerin ordered, discussed hand split with OT, conitnue  18. History of magnesium deficiency: magnesium 500mg  started HS    01/19/2023    5:17 AM 01/18/2023    8:42 PM 01/18/2023    2:34 PM  Vitals with BMI  Systolic 178 170 413  Diastolic 146 83 173  Pulse 100 95 93    19. HTN: Increase tizanidine to 6mg  TID. Magnesium supplement added HS.  Increase propanolol to 10mg  TID, decreased amantadine to 100mg  daily.   20. Area of redness between left thumb and index finger: continue foam dressing  21. Scalp eczema: selsun blue ordered, continue  22. Fatigue: improved, discussed with team may be secondary to anti-spasticity medications, B12 reviewed and is 335, in suboptimal range, will  give 1,000U B12 injection 8/7, will order monthly as needed, metanx started, increase to BID, continue  23. Right wrist swelling: XR ordered and shows no acute fracture, shows 2.25mm ulnar variance, brace removed to given patient a break from it. Voltaren gel ordered prn for discomfort, continue, CT ordered and show no acute osseus injury, continue ace wrap  23. Dysphagia: continue D3/thins, d/c ensure as blood pressure has severely elevated since starting  24. MRSA nares positive: negative on repeat, precautions d/ced  25. Tachycardic: increase propanolol to BID.  Continue Tizanidine increased to 6mg  TID. Decreased amantadine to 100mg  daily.    LOS: 62 days A FACE TO FACE EVALUATION WAS PERFORMED  Alika Eppes P Rheta Hemmelgarn 01/19/2023, 11:08 AM

## 2023-01-19 NOTE — Progress Notes (Signed)
Physical Therapy Session Note  Patient Details  Name: Nicole [name] MRN: [id number] Date of Birth: 29-Dec-1975  Today's Date: 01/19/2023 PT Individual Time: 1302-1328 PT Individual Time Calculation (min): 26 min   Short Term Goals: Week 7:  PT Short Term Goal 1 (Week 7): Pt will initiate bed mobility requiring no more than totalA. PT Short Term Goal 2 (Week 7): Pt will maintain sitting balance unsupported for up to 45 seconds PT Short Term Goal 3 (Week 7): Pt will demonstrate command follow for >10% of tasks. PT Short Term Goal 4 (Week 7): [name] RLE will demonstrate improved joint mobility into knee/ hip extension and ankle DF to neutral. Week 8: PT Short Term Goal 1 (Week 8): Pt will initiate bed mobility requiring no more than totalA. PT Short Term Goal 2 (Week 8): Pt will maintain sitting balance unsupported for up to 45 seconds PT Short Term Goal 3 (Week 8): Pt will demonstrate command follow for >10% of tasks. PT Short Term Goal 4 (Week 8): [name] RLE will demonstrate improved joint mobility into knee/ hip extension and ankle DF to neutral.  Skilled Therapeutic Interventions/Progress Updates:   Received pt sitting in manual WC. Pt awake and alert scratching head and grimacing. Discouraged scratching and gently combed hair and pt stopped scratching - per RN scratching head baseline behavior. Worked on initiation and functional participation attempting to have pt brush hair - pt required hand over hand assist to brush hair and worked on R elbow and wrist extension ROM during activity. At start of session pt making eye contact and tracking therapist, smiling, and attempting to mumble/nod "yes" when therapist asked pt if she liked her new WC. Performed LE stretching into knee extension and hip IR/ER but limited by tone - pt was grimacing indicating discomfort, therefore lessened range. Pt falling in/out of drowsiness during session. Concluded session with pt sitting in WC, needs within reach, and  seatbelt alarm on.   Therapy Documentation Precautions:  Precautions Precautions: Fall Precaution Comments: Contact precautions & delayed processing Restrictions Weight Bearing Restrictions: No  Therapy/Group: Individual Therapy Marlana Salvage Zaunegger Blima Rich PT, DPT 01/19/2023, 7:13 AM

## 2023-01-19 NOTE — Plan of Care (Signed)
Continue care plan

## 2023-01-19 NOTE — Progress Notes (Signed)
Speech Language Pathology Daily Session Note  Patient Details  Name: Nicole [name] MRN: [id number] Date of Birth: May 27, 1975  Today's Date: 01/19/2023 SLP Individual Time: 0830-0930 SLP Individual Time Calculation (min): 60 min  Short Term Goals: Week 9: SLP Short Term Goal 1 (Week 9): Pt will present w/ appropriate response to functional stimuli (i.e. accept spoon, grasp basic item appropriately, response to tactile/auditory stimuli) 20% of the time w/ maxA multimodal cues SLP Short Term Goal 2 (Week 9): Patient will follow 1-step commands within a context in 25% of opportunities with Max A multimodal cues SLP Short Term Goal 3 (Week 9): Patient will utilize multimodal communication as needed to answer basic yes/no questions with modA in 30% of opportunities  Skilled Therapeutic Interventions: Skilled treatment session focused on cognitive-linguistic goals. Upon arrival, patient was awake while upright in bed. Patient answered yes/no questions with head nods and intermittently at the word level consistently throughout session in 100% of opportunities with extra time. SLP provided written words that focused on word recognition/reading comprehension. Patient identified word using yes/no responses with 100% accuracy (4/4) and laughed appropriately when reading words associated with humor. Patient with improved spontaneous responses today at the phrase and sentence level. Patient unable to verbalize during formal/structured language tasks but did verbalize in response to informal questions/statements. Patient noted to demonstrate extreme groping with tightening of oral and facial muscles prior to verbalizing but when given extra time, patient able to work through tension and verbalize basic thoughts. However, decreased intelligibility noted due to low vocal intensity and reduce movement of oral-motor musculature.  Patient became fatigued at end of session, suspect due to effort used for verbalizations  throughout. Patient left upright in bed with alarm on and all needs within reach. Continue with current plan of care.   Pain No/Denies Pain   Therapy/Group: Individual Therapy  Zineb Glade 01/19/2023, 9:38 AM

## 2023-01-19 NOTE — Progress Notes (Signed)
Occupational Therapy Session Note  Patient Details  Name: Nicole [name] MRN: [id number] Date of Birth: 03/23/1976  Today's Date: 01/19/2023 OT Individual Time: 1110-1210 OT Individual Time Calculation (min): 60 min    Short Term Goals: Week 9: OT Short Term Goal 1 (Week 9): Patient will change orientation of toothbrush/ hairbrush to accomodate task with min assist after set up OT Short Term Goal 2 (Week 9): Patient will spear food x 3 once utensil in hand, then bring to mouth with only contact assistance  Skilled Therapeutic Interventions/Progress Updates:    Patient received in bed resting peacefully.  Patient nods head when asked if she would like to shower.  Patient transferred to chair with max assist.  Patient with improved ability to accept weight through BLE for squat pivot transfers.  Patient transported to bathroom, nad when asked if she needed to poop - she clearly said yes - so transferred to toilet, where patient had BM!  +2 Assistance for toilet hygiene.  Then trasnferred to shower bench for shower.  Patient assisted as typical with washing hair, face, chest.  Patient assisted back to wheelchair and completed dressing with max/total assistance.  Patient left up in wheelchair (standard) to encourage more upright and midline postural control with less external support.  Trialing this wheelchair at this time to determine patient's tolerance.  Patient left up with safety belt in place and engaged, lap tray in place, palm protector on left hand, and call bell on tray.   Therapy Documentation Precautions:  Precautions Precautions: Fall Precaution Comments: Contact precautions & delayed processing Restrictions Weight Bearing Restrictions: No  Pain:  No pain indicated at start of session         Therapy/Group: Individual Therapy  [name] 01/19/2023, 12:40 PM

## 2023-01-20 DIAGNOSIS — L309 Dermatitis, unspecified: Secondary | ICD-10-CM

## 2023-01-20 DIAGNOSIS — R000 Tachycardia, unspecified: Secondary | ICD-10-CM

## 2023-01-20 DIAGNOSIS — I10 Essential (primary) hypertension: Secondary | ICD-10-CM

## 2023-01-20 MED ORDER — HYDROCORTISONE 0.5 % EX CREA
TOPICAL_CREAM | Freq: Two times a day (BID) | CUTANEOUS | Status: AC
  Filled 2023-01-20: qty 28.35

## 2023-01-20 NOTE — Progress Notes (Signed)
Speech Language Pathology Daily Session Note  Patient Details  Name: Nicole [name] MRN: [id number] Date of Birth: Sep 10, 1975  Today's Date: 01/20/2023 SLP Individual Time: 7106-2694 SLP Individual Time Calculation (min): 40 min  Short Term Goals: Week 9: SLP Short Term Goal 1 (Week 9): Pt will present w/ appropriate response to functional stimuli (i.e. accept spoon, grasp basic item appropriately, response to tactile/auditory stimuli) 20% of the time w/ maxA multimodal cues SLP Short Term Goal 2 (Week 9): Patient will follow 1-step commands within a context in 25% of opportunities with Max A multimodal cues SLP Short Term Goal 3 (Week 9): Patient will utilize multimodal communication as needed to answer basic yes/no questions with modA in 30% of opportunities  Skilled Therapeutic Interventions: Skilled treatment session focused on communication goals. Upon arrival, patient was awake in bed but appeared to demonstrate increased grimacing with increased head and facial movements/tightness which appears to be related to some sort of irritation or discomfort. After giving patient options from a field 2, patient reported it was due to head pain from accidentally pulling her hair. Nursing made aware. Patient attempting to communicate but demonstrating increased difficulty today. However, patient able to get some phrases out with extra time but ~50% of her speech was unintelligible today. Patient's verbalization appeared more in response to informal questions and statements vs structured language tasks. Patient's mother present and educated regarding progress and current SLP goals. Patient left upright in bed with alarm on and mother present. Continue with current plan of care.      Pain Patient with frequent grimacing and appeared uncomfortable With extra time and questioning, patient reported discomfort on her scalp from where she had accidentally pulled her hair Nursing made aware   Therapy/Group:  Individual Therapy  Tynisha Ogan 01/20/2023, 12:25 PM

## 2023-01-20 NOTE — Progress Notes (Signed)
Physical Therapy Session Note  Patient Details  Name: Nicole [name] MRN: [id number] Date of Birth: Jan 31, 1976  Today's Date: 01/20/2023 PT Individual Time: 1537-1630 PT Individual Time Calculation (min): 53 min   Short Term Goals: Week 7:  PT Short Term Goal 1 (Week 7): Pt will initiate bed mobility requiring no more than totalA. PT Short Term Goal 2 (Week 7): Pt will maintain sitting balance unsupported for up to 45 seconds PT Short Term Goal 3 (Week 7): Pt will demonstrate command follow for >10% of tasks. PT Short Term Goal 4 (Week 7): [name] RLE will demonstrate improved joint mobility into knee/ hip extension and ankle DF to neutral. Week 8: PT Short Term Goal 1 (Week 8): Pt will initiate bed mobility requiring no more than totalA. PT Short Term Goal 2 (Week 8): Pt will maintain sitting balance unsupported for up to 45 seconds PT Short Term Goal 3 (Week 8): Pt will demonstrate command follow for >10% of tasks. PT Short Term Goal 4 (Week 8): [name] RLE will demonstrate improved joint mobility into knee/ hip extension and ankle DF to neutral.  Skilled Therapeutic Interventions/Progress Updates:  Patient supine in bed with BIL dynamic ankle night splints donned to BLE and comfy splints to elbows and palm guard to L hand. Head position is flexed forward with increased tone. Patient awake/ alert and demos turn of head to direction of therapist to maintain gaze while entering room. Pt continues to demo irritation and all braces doffed in attempt to locate irritant and to check skin for any identifying marks. Brief is soiled with b/b incontinence.  Initiated bed mobility with attempt to have pt participate initiate roll to each side. Pt nods that she will start movement but no movement noted. Rolled with DEP to each side. Cleaned with DEP and brief changed.   Dorsum of feet noted to have light redness  bilaterally and splint straps adjusted for improved fit to maintain sustained stretch into DF for  overnight use. Splints also marked for footedness to pt.    Attempt to engage pt throughout session with conversation. Increased appropriate responses noted today with 25-50% appropriate responses to questions and some other non-contextual complete and incomplete sentences.    Pt seated on EOB with DEP to elevated height for large angles to hips and knees while maintaining feet to floor for improved balance. Assisted pt to midline with slight forward positioning to trunk. Hold position for ~10 sec prior to slow forward LOB. Pt does demo recognition of forward LOB and slows forward movement but is not able to stop completely. Requires physical assist to return to midline. Similar demonstration x3.  Hand placed to therapist knee for stabilizing with assist into forward lean and then returned to seat dependently.   Returned to supine and progressed in hooklying for trunk rotations as well as hip rotations into IR/ ER with extended holds. Pt demos relaxation and falls asleep during mobilizations.    NMR performed for improvements in motor control, cognition, and maintaining body mobility for future purposeful movements.   All needs within reach. NT notified as to pt's disposition, b/b incontinence and subsequent pericare.    Therapy Documentation Precautions:  Precautions Precautions: Fall Precaution Comments: Contact precautions & delayed processing Restrictions Weight Bearing Restrictions: No General:   Vital Signs:   Pain:  No pain indicated this session. Improved irritation during mobilizing pt's body for improved future mobility.   Therapy/Group: Individual Therapy  Loel Dubonnet PT, DPT, CSRS 01/20/2023, 6:27  PM

## 2023-01-20 NOTE — Progress Notes (Incomplete)
Physical Therapy Session Note  Patient Details  Name: Nicole [name] MRN: [id number] Date of Birth: 10-26-1975  Today's Date: 01/20/2023 PT Individual Time: 8295-6213 PT Individual Time Calculation (min): 31 min   Short Term Goals: Week 7:  PT Short Term Goal 1 (Week 7): Pt will initiate bed mobility requiring no more than totalA. PT Short Term Goal 2 (Week 7): Pt will maintain sitting balance unsupported for up to 45 seconds PT Short Term Goal 3 (Week 7): Pt will demonstrate command follow for >10% of tasks. PT Short Term Goal 4 (Week 7): [name] RLE will demonstrate improved joint mobility into knee/ hip extension and ankle DF to neutral. Week 8: PT Short Term Goal 1 (Week 8): Pt will initiate bed mobility requiring no more than totalA. PT Short Term Goal 2 (Week 8): Pt will maintain sitting balance unsupported for up to 45 seconds PT Short Term Goal 3 (Week 8): Pt will demonstrate command follow for >10% of tasks. PT Short Term Goal 4 (Week 8): [name] RLE will demonstrate improved joint mobility into knee/ hip extension and ankle DF to neutral.  Skilled Therapeutic Interventions/Progress Updates:  Patient supine in bed on entrance to room. Patient alert and agreeable to PT session. Breakfast tray in room - NT informs PT that she must have received a second tray as she ate the originally delivered tray. [name] R hand mitt still attached at wrist but pt has removed mitt from hand.   Patient with no pain complaint at start of session. However, pt is demonstrating behaviors related to irritation with red face, scratching at head/ pulling hair, and scratching mouth/ chin. Asked pt re: pain at differing body parts moving from feet up to head with pt shaking head "no" throughout. Brief clean and dry.   Dynamic night splints noted to have ankle straps loosened. [name] feet positioned in splints and buckles re-adjusted and fastened with BLE positioned in full extension. Added tape to velcro tabs to remind staff  to remove via buckle and not to adjust velcro. Irritation noted at first but then pt with improvement in symptoms overall. Just prior to leaving room, pt starts with scratching to chin and head again.   New comfy splint donned to R elbow to decrease reach to head and chin as well as to continue to position pt in increased elbow extension. Will adjust comfy brace in this afternoon's session.  Held red foam cylinder buildup for utensils out in front of pt and asked her to grasp with pt able to follow instructions with RUE and grasp. Repositioned in hand for longer term hold of object and pt tapping with thumb.   Patient supine in bed at end of session with brakes locked, no bed alarm set, but safety increased with 4 rails up and all needs within reach.   Therapy Documentation Precautions:  Precautions Precautions: Fall Precaution Comments: Contact precautions & delayed processing Restrictions Weight Bearing Restrictions: No  Pain:  Irritation noted from pt's behaviors with little indication as to location of irritant.   Therapy/Group: Individual Therapy  Loel Dubonnet PT, DPT, CSRS 01/20/2023, 6:25 PM

## 2023-01-20 NOTE — Progress Notes (Signed)
PROGRESS NOTE   Subjective/Complaints: No acute events overnight noted.  Her mother asked about area of eczema on her scalp.  Mother feels like she is scratching this area.  ROS: Limited due to cognitive/behavioral   Objective:   No results found. No results for input(s): "WBC", "HGB", "HCT", "PLT" in the last 72 hours.   Recent Labs    01/19/23 0637  CREATININE 0.58       Intake/Output Summary (Last 24 hours) at 01/20/2023 1727 Last data filed at 01/20/2023 1359 Gross per 24 hour  Intake 774 ml  Output --  Net 774 ml      Pressure Injury 10/31/22 Buttocks Left Unstageable - Full thickness tissue loss in which the base of the injury is covered by slough (yellow, tan, gray, green or brown) and/or eschar (tan, brown or black) in the wound bed. (Active)  10/31/22 2000  Location: Buttocks  Location Orientation: Left  Staging: Unstageable - Full thickness tissue loss in which the base of the injury is covered by slough (yellow, tan, gray, green or brown) and/or eschar (tan, brown or black) in the wound bed.  Wound Description (Comments):   Present on Admission: Yes     Pressure Injury 11/18/22 Toe (Comment  which one) Anterior;Right Deep Tissue Pressure Injury - Purple or maroon localized area of discolored intact skin or blood-filled blister due to damage of underlying soft tissue from pressure and/or shear. small are (Active)  11/18/22 1300  Location: Toe (Comment  which one)  Location Orientation: Anterior;Right  Staging: Deep Tissue Pressure Injury - Purple or maroon localized area of discolored intact skin or blood-filled blister due to damage of underlying soft tissue from pressure and/or shear.  Wound Description (Comments): small area on right great toe metatarsal  Present on Admission: Yes    Physical Exam: Vital Signs Blood pressure (!) 141/99, pulse 93, temperature 98.2 F (36.8 C), temperature source Oral,  resp. rate 16, height 5\' 1"  (1.549 m), weight 45.6 kg, SpO2 97%.  Gen: no distress, normal appearing, BMI 18.99 HEENT: oral mucosa pink and moist, NCAT, impaired cervical range of motion Cardio: HR normal rate and rhythm Chest: normal effort, normal rate of breathing Abd: soft, non-distended Ext: no edema Psych: Increased vocalizations, mostly nonsensical  Skin: + Scaling dried skin on head present-2 small excoriations . stage 2 to right toe, area of redness between left thumb and index finger-covered in dressing, left buttock unstageable area not viewed  Neurologic: Minimal engagement on exam, tired.  Does not follow simple commands. Delayed processing Tone:  MAS 3 right elbow, MAS 3 knee flexors, and MAS 3 L>R  hip adductors  MAS 3 L elbow , MS 3-4 in wrist and finger flexors  + LUE WHO  Motor 0/5 strength in LUE and BLE, some purposeful movement in RUE ; reflexively grasps hand  Withdraws to pain in bilateral lower and right upper extremities. Musculoskeletal: reduced bilateral shoulder and elbow ROM due to increased tone  Right wrist swelling -Ace wrapped.   Assessment/Plan: 1. Functional deficits which require 3+ hours per day of interdisciplinary therapy in a comprehensive inpatient rehab setting. Physiatrist is providing close team supervision and 24 hour management  of active medical problems listed below. Physiatrist and rehab team continue to assess barriers to discharge/monitor patient progress toward functional and medical goals  Care Tool:  Bathing    Body parts bathed by patient: Face, Abdomen, Chest   Body parts bathed by helper: Right arm, Left arm, Front perineal area, Buttocks, Right upper leg, Left upper leg, Right lower leg, Left lower leg Body parts n/a: Right arm, Left arm, Front perineal area, Buttocks, Right upper leg, Left upper leg, Right lower leg, Left lower leg   Bathing assist Assist Level: Maximal Assistance - Patient 24 - 49%     Upper Body  Dressing/Undressing Upper body dressing   What is the patient wearing?: Pull over shirt    Upper body assist Assist Level: Total Assistance - Patient < 25%    Lower Body Dressing/Undressing Lower body dressing      What is the patient wearing?: Pants, Incontinence brief     Lower body assist Assist for lower body dressing: Dependent - Patient 0%     Toileting Toileting Toileting Activity did not occur (Clothing management and hygiene only): N/A (no void or bm)  Toileting assist Assist for toileting: Total Assistance - Patient < 25%     Transfers Chair/bed transfer  Transfers assist  Chair/bed transfer activity did not occur: Safety/medical concerns  Chair/bed transfer assist level: Maximal Assistance - Patient 25 - 49%     Locomotion Ambulation   Ambulation assist   Ambulation activity did not occur: Safety/medical concerns          Walk 10 feet activity   Assist  Walk 10 feet activity did not occur: Safety/medical concerns  Assist level: Dependent - Patient 0%     Walk 50 feet activity   Assist Walk 50 feet with 2 turns activity did not occur: Safety/medical concerns         Walk 150 feet activity   Assist Walk 150 feet activity did not occur: Safety/medical concerns         Walk 10 feet on uneven surface  activity   Assist Walk 10 feet on uneven surfaces activity did not occur: Safety/medical concerns         Wheelchair     Assist Is the patient using a wheelchair?: Yes Type of Wheelchair: Manual    Wheelchair assist level: Dependent - Patient 0%      Wheelchair 50 feet with 2 turns activity    Assist    Wheelchair 50 feet with 2 turns activity did not occur: Safety/medical concerns   Assist Level: Dependent - Patient 0%   Wheelchair 150 feet activity     Assist  Wheelchair 150 feet activity did not occur: Safety/medical concerns   Assist Level: Dependent - Patient 0%   Blood pressure (!) 141/99,  pulse 93, temperature 98.2 F (36.8 C), temperature source Oral, resp. rate 16, height 5\' 1"  (1.549 m), weight 45.6 kg, SpO2 97%.  Medical Problem List and Plan: 1. 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  Impaired mobility: continue Lovenox, weekly creatinine ordered             -antiplatelet therapy: N/A 3. Pain Management: Tylenol as needed  4. History of anxiety: discussed with her mom that she feels this was a big risk factor for her accident  5. Neuropsych/cognition: This patient is not capable of making decisions on her own behalf. 6. Left buttock unstageable ulcer.  continue Medihoney to buttock wound daily cover with foam dressing.  Changing dressing every 3 days or as needed soiling   7. Fluids/Electrolytes/Nutrition: Routine in and outs with follow-up chemistries  -Eating 100% most meals with total assist  -9/7 continues to eat most of her meals 90 to 100%, continue to monitor p.o. intake  8.  Cavitary right lower lobe pneumonia likely aspiration pneumonia/MRSA pneumonia.  Resolved    Latest Ref Rng & Units 12/20/2022   10:54 AM 12/11/2022    7:15 AM 12/03/2022     7:18 AM  CBC  WBC 4.0 - 10.5 K/uL 5.6  9.0  8.7   Hemoglobin 12.0 - 15.0 g/dL 40.1  02.7  25.3   Hematocrit 36.0 - 46.0 % 42.8  45.1  39.7   Platelets 150 - 400 K/uL 253  329  218      9.  History of drug abuse.  Positive cocaine on urine drug screen.  Provide counseling  10.  AKI/hypovolemia and ATN.  resolved    Latest Ref Rng & Units 01/19/2023    6:37 AM 01/17/2023   11:59 AM 01/12/2023    8:02 AM  BMP  Glucose 70 - 99 mg/dL  664    BUN 6 - 20 mg/dL  12    Creatinine 4.03 - 1.00 mg/dL 4.74  2.59  5.63   Sodium 135 - 145 mmol/L  138    Potassium 3.5 - 5.1 mmol/L  3.8    Chloride 98 - 111 mmol/L  102    CO2 22 - 32 mmol/L  27    Calcium 8.9 - 10.3 mg/dL  9.0       11.  Mild transaminitis with rhabdomyolysis.  Both resolved  12.  Hypotension. Resolved, d/c midodrine  13. Impaired initiation: continue amantadine 100mg  BID  14. Suboptimal vitamin D: continue 1,000U D3 daily  15. Spasticity:   -discussed serial casting with OT and her mom  - continue baclofen to 20mg  TID and tizanidine to 4mg  TID. Continue wrist, elbow brace, and PRAFOs. Ordered a night splint for LLE  Repeat CMP today given chronic tizanidine use  -9/7 continues to have significant tone despite use of tizanidine and baclofen, may be beneficial consider baclofen pump in outpatient setting if this causes significant difficulties in care  16. Bowel and bladder incontinence: continue bowel and bladder program   17. Left hand skin maceration: Eucerin ordered, discussed hand split with OT, conitnue  18. History of magnesium deficiency: magnesium 500mg  started HS    01/20/2023    1:51 PM 01/20/2023    4:46 AM 01/19/2023    8:19 PM  Vitals with BMI  Systolic 141 140  130  Diastolic 99 102 90  Pulse 93 78 78    19. HTN: Increase tizanidine to 6mg  TID. Magnesium supplement added HS. Increase propanolol to 10mg  TID, decreased amantadine to 100mg  daily.  9/7 diastolic elevated at times but appears to be doing  better overall, continue to monitor trend     01/20/2023    1:51 PM 01/20/2023    4:46 AM 01/19/2023    8:19 PM  Vitals with BMI  Systolic 141 140 147  Diastolic 99 102 90  Pulse 93 78 78     20. Area of redness between left thumb and index finger: continue foam dressing  21. Scalp eczema: selsun blue ordered, continue  -9/7 patient's mother feels like this is causing her to itch, will start some hydrocortisone cream  22. Fatigue: improved, discussed with team may be secondary to anti-spasticity medications, B12 reviewed and is 335, in suboptimal range, will give 1,000U B12 injection 8/7, will order monthly as needed, metanx started, increase to BID, continue  23. Right wrist swelling: XR ordered and shows no acute fracture, shows 2.67mm ulnar variance, brace removed to given patient a break from it. Voltaren gel ordered prn for discomfort, continue, CT ordered and show no acute osseus injury, continue ace wrap  23. Dysphagia: continue D3/thins, d/c ensure as blood pressure has severely elevated since starting  24. MRSA nares positive: negative on repeat, precautions d/ced  25. Tachycardic: increase propanolol to BID.  Continue Tizanidine increased to 6mg  TID. Decreased amantadine to 100mg  daily.   9/7 HR stable    LOS: 63 days A FACE TO FACE EVALUATION WAS PERFORMED  Fanny Dance 01/20/2023, 5:27 PM

## 2023-01-21 ENCOUNTER — Inpatient Hospital Stay (HOSPITAL_COMMUNITY): Payer: MEDICAID

## 2023-01-21 DIAGNOSIS — R569 Unspecified convulsions: Secondary | ICD-10-CM

## 2023-01-21 DIAGNOSIS — R252 Cramp and spasm: Secondary | ICD-10-CM

## 2023-01-21 DIAGNOSIS — R790 Abnormal level of blood mineral: Secondary | ICD-10-CM

## 2023-01-21 MED ORDER — AMANTADINE HCL 50 MG/5ML PO SOLN
100.0000 mg | Freq: Two times a day (BID) | ORAL | Status: DC
  Administered 2023-01-21: 100 mg
  Filled 2023-01-21 (×2): qty 10

## 2023-01-21 MED ORDER — AMANTADINE HCL 100 MG PO CAPS
100.0000 mg | ORAL_CAPSULE | Freq: Two times a day (BID) | ORAL | Status: DC
  Administered 2023-01-22 – 2023-02-03 (×25): 100 mg via ORAL
  Filled 2023-01-21 (×26): qty 1

## 2023-01-21 NOTE — Progress Notes (Addendum)
PROGRESS NOTE   Subjective/Complaints: No acute events overnight noted.  Patient noted to be having some dyskinetic movements in her face and head.  ROS: Limited due to cognitive/behavioral   Objective:   No results found. No results for input(s): "WBC", "HGB", "HCT", "PLT" in the last 72 hours.   Recent Labs    01/19/23 0637  CREATININE 0.58       Intake/Output Summary (Last 24 hours) at 01/21/2023 1648 Last data filed at 01/21/2023 1407 Gross per 24 hour  Intake 476 ml  Output --  Net 476 ml      Pressure Injury 10/31/22 Buttocks Left Unstageable - Full thickness tissue loss in which the base of the injury is covered by slough (yellow, tan, gray, green or brown) and/or eschar (tan, brown or black) in the wound bed. (Active)  10/31/22 2000  Location: Buttocks  Location Orientation: Left  Staging: Unstageable - Full thickness tissue loss in which the base of the injury is covered by slough (yellow, tan, gray, green or brown) and/or eschar (tan, brown or black) in the wound bed.  Wound Description (Comments):   Present on Admission: Yes     Pressure Injury 11/18/22 Toe (Comment  which one) Anterior;Right Deep Tissue Pressure Injury - Purple or maroon localized area of discolored intact skin or blood-filled blister due to damage of underlying soft tissue from pressure and/or shear. small are (Active)  11/18/22 1300  Location: Toe (Comment  which one)  Location Orientation: Anterior;Right  Staging: Deep Tissue Pressure Injury - Purple or maroon localized area of discolored intact skin or blood-filled blister due to damage of underlying soft tissue from pressure and/or shear.  Wound Description (Comments): small area on right great toe metatarsal  Present on Admission: Yes    Physical Exam: Vital Signs Blood pressure 117/87, pulse 91, temperature 97.6 F (36.4 C), resp. rate 15, height 5\' 1"  (1.549 m), weight 45.6  kg, SpO2 97%.  Gen: no distress, normal appearing, BMI 18.99 HEENT: oral mucosa pink and moist, NCAT, impaired cervical range of motion Cardio: HR normal rate and rhythm Chest: normal effort, normal rate of breathing Abd: soft, non-distended Ext: no edema Psych: Increased vocalizations, mostly nonsensical  Skin: + Scaling dried skin on head present-2 small excoriations . stage 2 to right toe, area of redness between left thumb and index finger-covered in dressing, left buttock unstageable area not viewed  Neurologic: Minimal engagement on exam, tired.  Does not follow simple commands. Delayed processing Dyskinetic movements and twitching noted in face and head Tone:  MAS 3 right elbow, MAS 3 knee flexors, and MAS 3 L>R  hip adductors  MAS 3 L elbow , MS 3-4 in wrist and finger flexors  + LUE WHO  Motor 0/5 strength in LUE and BLE, some purposeful movement in RUE ; reflexively grasps hand  Withdraws to pain in bilateral lower and right upper extremities. Musculoskeletal: reduced bilateral shoulder and elbow ROM due to increased tone  Right wrist swelling -Ace wrapped.   Assessment/Plan: 1. Functional deficits which require 3+ hours per day of interdisciplinary therapy in a comprehensive inpatient rehab setting. Physiatrist is providing close team supervision and 24 hour management of  active medical problems listed below. Physiatrist and rehab team continue to assess barriers to discharge/monitor patient progress toward functional and medical goals  Care Tool:  Bathing    Body parts bathed by patient: Face, Abdomen, Chest   Body parts bathed by helper: Right arm, Left arm, Front perineal area, Buttocks, Right upper leg, Left upper leg, Right lower leg, Left lower leg Body parts n/a: Right arm, Left arm, Front perineal area, Buttocks, Right upper leg, Left upper leg, Right lower leg, Left lower leg   Bathing assist Assist Level: Maximal Assistance - Patient 24 - 49%     Upper  Body Dressing/Undressing Upper body dressing   What is the patient wearing?: Pull over shirt    Upper body assist Assist Level: Total Assistance - Patient < 25%    Lower Body Dressing/Undressing Lower body dressing      What is the patient wearing?: Pants, Incontinence brief     Lower body assist Assist for lower body dressing: Dependent - Patient 0%     Toileting Toileting Toileting Activity did not occur (Clothing management and hygiene only): N/A (no void or bm)  Toileting assist Assist for toileting: Total Assistance - Patient < 25%     Transfers Chair/bed transfer  Transfers assist  Chair/bed transfer activity did not occur: Safety/medical concerns  Chair/bed transfer assist level: Maximal Assistance - Patient 25 - 49%     Locomotion Ambulation   Ambulation assist   Ambulation activity did not occur: Safety/medical concerns          Walk 10 feet activity   Assist  Walk 10 feet activity did not occur: Safety/medical concerns  Assist level: Dependent - Patient 0%     Walk 50 feet activity   Assist Walk 50 feet with 2 turns activity did not occur: Safety/medical concerns         Walk 150 feet activity   Assist Walk 150 feet activity did not occur: Safety/medical concerns         Walk 10 feet on uneven surface  activity   Assist Walk 10 feet on uneven surfaces activity did not occur: Safety/medical concerns         Wheelchair     Assist Is the patient using a wheelchair?: Yes Type of Wheelchair: Manual    Wheelchair assist level: Dependent - Patient 0%      Wheelchair 50 feet with 2 turns activity    Assist    Wheelchair 50 feet with 2 turns activity did not occur: Safety/medical concerns   Assist Level: Dependent - Patient 0%   Wheelchair 150 feet activity     Assist  Wheelchair 150 feet activity did not occur: Safety/medical concerns   Assist Level: Dependent - Patient 0%   Blood pressure 117/87,  pulse 91, temperature 97.6 F (36.4 C), resp. rate 15, height 5\' 1"  (1.549 m), weight 45.6 kg, SpO2 97%.  Medical Problem List and Plan: 1. 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  Impaired mobility: continue Lovenox, weekly creatinine ordered             -antiplatelet therapy: N/A 3. Pain Management: Tylenol as needed  4. History of anxiety: discussed with her mom that she feels this was a big risk factor for her accident  5. Neuropsych/cognition: This patient is not capable of making decisions on her own behalf. 6. Left buttock unstageable ulcer.  continue Medihoney to buttock wound daily cover with foam dressing.  Changing dressing every 3 days or as needed soiling   7. Fluids/Electrolytes/Nutrition: Routine in and outs with follow-up chemistries  -Eating 100% most meals with total assist  -9/8 she is eating most of her meals with assistance   8.  Cavitary right lower lobe pneumonia likely aspiration pneumonia/MRSA pneumonia.  Resolved    Latest Ref Rng & Units 12/20/2022   10:54 AM 12/11/2022    7:15 AM 12/03/2022    7:18 AM  CBC  WBC 4.0 - 10.5 K/uL 5.6  9.0  8.7    Hemoglobin 12.0 - 15.0 g/dL 93.8  18.2  99.3   Hematocrit 36.0 - 46.0 % 42.8  45.1  39.7   Platelets 150 - 400 K/uL 253  329  218      9.  History of drug abuse.  Positive cocaine on urine drug screen.  Provide counseling  10.  AKI/hypovolemia and ATN.  resolved    Latest Ref Rng & Units 01/19/2023    6:37 AM 01/17/2023   11:59 AM 01/12/2023    8:02 AM  BMP  Glucose 70 - 99 mg/dL  716    BUN 6 - 20 mg/dL  12    Creatinine 9.67 - 1.00 mg/dL 8.93  8.10  1.75   Sodium 135 - 145 mmol/L  138    Potassium 3.5 - 5.1 mmol/L  3.8    Chloride 98 - 111 mmol/L  102    CO2 22 - 32 mmol/L  27    Calcium 8.9 - 10.3 mg/dL  9.0     Recheck BMP tomorrow  11.  Mild transaminitis with rhabdomyolysis.  Both resolved  12.  Hypotension. Resolved, d/c midodrine  13. Impaired initiation: continue amantadine 100mg  BID  14. Suboptimal vitamin D: continue 1,000U D3 daily  15. Spasticity:   -discussed serial casting with OT and her mom  - continue baclofen to 20mg  TID and tizanidine to 4mg  TID. Continue wrist, elbow brace, and PRAFOs. Ordered a night splint for LLE  Repeat CMP today given chronic tizanidine use  -9/7 continues to have significant tone despite use of tizanidine and baclofen, may be beneficial consider baclofen pump in outpatient setting if this causes significant difficulties in care  9/8 consider decrease baclofen if dyskinetic movements continue  16. Bowel and bladder incontinence: continue bowel and bladder program   17. Left hand skin maceration: Eucerin ordered, discussed hand split with OT, conitnue  18. History of magnesium deficiency: magnesium 500mg  started HS    01/21/2023    2:07 PM 01/21/2023    6:34 AM 01/20/2023    8:12 PM  Vitals with BMI  Systolic  117 127 118  Diastolic 87 93 82  Pulse 91 88 81  Recheck tomorrow  19. HTN: Increase tizanidine to 6mg  TID. Magnesium supplement added HS. Increase propanolol to 10mg  TID, decreased amantadine to 100mg  daily.  9/8  controlled overall, continue to monitor     01/21/2023    2:07 PM 01/21/2023    6:34 AM 01/20/2023    8:12 PM  Vitals with BMI  Systolic 117 127 161  Diastolic 87 93 82  Pulse 91 88 81     20. Area of redness between left thumb and index finger: continue foam dressing  21. Scalp eczema: selsun blue ordered, continue  -9/7 patient's mother feels like this is causing her to itch, will start some hydrocortisone cream  22. Fatigue: improved, discussed with team may be secondary to anti-spasticity medications, B12 reviewed and is 335, in suboptimal range, will give 1,000U B12 injection 8/7, will order monthly as needed, metanx started, increase to BID, continue  23. Right wrist swelling: XR ordered and shows no acute fracture, shows 2.44mm ulnar variance, brace removed to given patient a break from it. Voltaren gel ordered prn for discomfort, continue, CT ordered and show no acute osseus injury, continue ace wrap  23. Dysphagia: continue D3/thins, d/c ensure as blood pressure has severely elevated since starting  24. MRSA nares positive: negative on repeat, precautions d/ced  25. Tachycardic: increase propanolol to BID.  Continue Tizanidine increased to 6mg  TID. Decreased amantadine to 100mg  daily.   9/8 HR stable  26.  Dyskinetic movements in face and head  -9/8  Discussed medications with pharmacy.  Her amantadine dose was recently decreased, possible this was decreasing movement disorder.  Will increase amantadine back to 100 mg twice daily.  Will check EEG although do not suspect seizure.  If no improvement could consider decreasing baclofen. Recheck labs tomorrow ordered.    LOS: 64 days A FACE TO FACE EVALUATION WAS PERFORMED  Fanny Dance 01/21/2023, 4:48 PM

## 2023-01-21 NOTE — Progress Notes (Signed)
EEG complete - results pending 

## 2023-01-21 NOTE — Procedures (Signed)
Patient Name: Nicole [name]  MRN: [id number]  Epilepsy Attending: Charlsie Quest  Referring Physician/Provider: Fanny Dance, MD  Date: 01/21/2023 Duration: 24.32 mins  Patient history: 47yo F with seizure like activity getting eeg to evaluate for seizure.  Level of alertness: Awake  AEDs during EEG study: None  Technical aspects: This EEG study was done with scalp electrodes positioned according to the 10-20 International system of electrode placement. Electrical activity was reviewed with band pass filter of 1-70Hz , sensitivity of 7 uV/mm, display speed of 44mm/sec with a 60Hz  notched filter applied as appropriate. EEG data were recorded continuously and digitally stored.  Video monitoring was available and reviewed as appropriate.  Description:  EEG showed continuous generalized predominantly 5 to 8 Hz theta- alpha activity admixed with intermittent 2-3Hz  delta slowing. Hyperventilation and photic stimulation were not performed.      Of note, EEG was technically difficult due to significant movement artifact.    ABNORMALITY - Continuous slow, generalized   IMPRESSION: This technically difficult study is suggestive of moderate diffuse encephalopathy. No seizures or epileptiform discharges were seen throughout the recording.   Guerino Caporale Annabelle Harman

## 2023-01-22 LAB — CBC
HCT: 47.5 % — ABNORMAL HIGH (ref 36.0–46.0)
Hemoglobin: 15.5 g/dL — ABNORMAL HIGH (ref 12.0–15.0)
MCH: 31.1 pg (ref 26.0–34.0)
MCHC: 32.6 g/dL (ref 30.0–36.0)
MCV: 95.2 fL (ref 80.0–100.0)
Platelets: 274 10*3/uL (ref 150–400)
RBC: 4.99 MIL/uL (ref 3.87–5.11)
RDW: 12.4 % (ref 11.5–15.5)
WBC: 8.1 10*3/uL (ref 4.0–10.5)
nRBC: 0 % (ref 0.0–0.2)

## 2023-01-22 LAB — BASIC METABOLIC PANEL
Anion gap: 9 (ref 5–15)
BUN: 14 mg/dL (ref 6–20)
CO2: 26 mmol/L (ref 22–32)
Calcium: 9.1 mg/dL (ref 8.9–10.3)
Chloride: 104 mmol/L (ref 98–111)
Creatinine, Ser: 0.62 mg/dL (ref 0.44–1.00)
GFR, Estimated: >60 mL/min (ref >60)
Glucose, Bld: 90 mg/dL (ref 70–99)
Potassium: 3.9 mmol/L (ref 3.5–5.1)
Sodium: 139 mmol/L (ref 135–145)

## 2023-01-22 LAB — MAGNESIUM: Magnesium: 2 mg/dL (ref 1.7–2.4)

## 2023-01-22 NOTE — Progress Notes (Signed)
Physical Therapy Session Note  Patient Details  Name: Nicole [name] MRN: [id number] Date of Birth: 06-12-1975  Today's Date: 01/22/2023 PT Individual Time: 0900-1010 + 1415-1445 PT Individual Time Calculation (min): 70 min  + 30 min  Short Term Goals: Week 8: PT Short Term Goal 1 (Week 8): Pt will initiate bed mobility requiring no more than totalA. PT Short Term Goal 2 (Week 8): Pt will maintain sitting balance unsupported for up to 45 seconds PT Short Term Goal 3 (Week 8): Pt will demonstrate command follow for >10% of tasks. PT Short Term Goal 4 (Week 8): [name] RLE will demonstrate improved joint mobility into knee/ hip extension and ankle DF to neutral.  Skilled Therapeutic Interventions/Progress Updates:      1st session: Pt in bed to start. No observable signs of pain. Attempted to communicate via "yes/no" colored choices but no pointing or indications made. Patient smelled of incontinent BM - when asked patient if she had a BM she verbalized "yes." Dependent assist for pericare at bed level.   Worked on automated responses and feeding - patient holding a banana in her R hand and self feeding the banana with cues for small bites and chewing/swallowing before having another bite. Pt ate 100% of the banana.   Donned pants dependently at bed level.   Dependent assist for supine<>sitting EOB. Completed dependent squat pivot transfer with 1 person assist into standard manual wheechair. In day room rehab gym, assisted onto mat table with +2 dependent lift. On tilt table, pt tolerated near full tilt for >10 minutes with PT positioning ankles/feet and promoting TKE bilaterally to encourage weight bearing and stretching of posterior chain. Tried to add reflexive task (ball toss, bag toss, handing off objects, etc) but limited volitional response. She did verbalize "ok" later in session when asked "are you feeling ok?"   Pt returned to her room and concluded session seated in manual wheelchair with  full lap tray attached, pillows to support elbows, all needs met.     2nd session: Pt received sitting upright in standard manual wheelchair - appears to be safe without sacral sitting or sliding. Pt in no signs of pain or discomfort. Focused session on passive stretching at bed level. Dependent squat pivot transfer from w/c to bed, towards her L side. Assist for lying supine and repositioned as needed. Worked on stretching hamstrings, quad's in sidelying, hip extensors, and heel cord from bed level positions, all passive. Pt attempting to verbalize during session with her teeth clinched, unintelligible. Pt incontinent of bladder - pericare and brief change dependently at bed level. Repositioned in bed, alarm on, call bell in lap.   Therapy Documentation Precautions:  Precautions Precautions: Fall Precaution Comments: Contact precautions & delayed processing Restrictions Weight Bearing Restrictions: No General:      Therapy/Group: Individual Therapy  Orrin Brigham 01/22/2023, 7:47 AM

## 2023-01-22 NOTE — Progress Notes (Signed)
Patient ID: Nicole [name], female   DOB: January 20, 1976, 47 y.o.   MRN: [id number]  Have left another message for Natasha-Medicaid worker regarding status of disability and long term care medicaid. Await return call.

## 2023-01-22 NOTE — Progress Notes (Signed)
Speech Language Pathology Daily Session Note  Patient Details  Name: JULIEONNA BURIANEK MRN: [id number] Date of Birth: 13-Dec-1975  Today's Date: 01/22/2023 SLP Individual Time: 1300-1345 SLP Individual Time Calculation (min): 45 min  Short Term Goals: Week 9: SLP Short Term Goal 1 (Week 9): Pt will present w/ appropriate response to functional stimuli (i.e. accept spoon, grasp basic item appropriately, response to tactile/auditory stimuli) 20% of the time w/ maxA multimodal cues SLP Short Term Goal 2 (Week 9): Patient will follow 1-step commands within a context in 25% of opportunities with Max A multimodal cues SLP Short Term Goal 3 (Week 9): Patient will utilize multimodal communication as needed to answer basic yes/no questions with modA in 30% of opportunities  Skilled Therapeutic Interventions:   Pt greeted at bedside as RNT was assisting pt w/ final portion of noon meal. SLP facilitated tx tasks targeting cognition and language. Given additional time, she was able to verbalize around ~35% of her responses and nod Y/N in 80% of opportunities throughout functional tx tasks. Limited vocal intensity and reduced articulatory precision noted and pt was unintelligible during 2/2 sentence length responses. SLP utilized errorless learning and provided calendar to review orientation information. Also presented pt w/ 3 functional words to ID. Challenged pt to verbalize words, though pt was unable to vocalize during 3/3 attempts. However, she was able to nod Y or N to ID correct spelling of the word. With physical hand over hand assistance to bring a tissue to her nose, she was able to nod L and R to wipe her nose multiple times. She continues to demonstrate increased success w/ very functional vs structured tx tasks, will continue to facilitate both in upcoming tx sessions. She was left in her chair with the alarm set and call light within reach. Recommend cont ST per POC.    Pain  Nodded "no" to indicate no  pain - Appeared comfortable.   Therapy/Group: Individual Therapy  [name] 01/22/2023, 7:30 AM

## 2023-01-22 NOTE — Progress Notes (Signed)
Occupational Therapy Session Note  Patient Details  Name: Nicole [name] MRN: [id number] Date of Birth: 17-Sep-1975  Today's Date: 01/22/2023 OT Individual Time: 2952-8413 OT Individual Time Calculation (min): 62 min    Short Term Goals: Week 9: OT Short Term Goal 1 (Week 9): Patient will change orientation of toothbrush/ hairbrush to accomodate task with min assist after set up OT Short Term Goal 2 (Week 9): Patient will spear food x 3 once utensil in hand, then bring to mouth with only contact assistance  Skilled Therapeutic Interventions/Progress Updates:    Patient received seated in regular wheelchair with laptray in place and pillows under arms.  Patient awake and alert and turning head to her name being called.  Patient tapping fingers of right hand on tray.  Therapist tapped an audible pattern, that patient then able to repeat.  Patient has difficulty with termination of finger movement - so tapped out pattern, then had subsequent repetitive taps.  Continued with 4 distinct patterns successfully.  Attempted to have patient type/ interact with keyboard.   Placed laptop in front of patient, and she was able to use her right hand to engage with keyboard.  She was able to tap letters, and symbols repetitively.  She need max cueing to locate specific letters and key them - e.g. "L" for [name].  Patient did engage visually at times with screen.  Did better engaging visaully when screen held items of interest - eg pictures of guitar, video of woman playing guitar, etc.   Soaked patient's left hand and worked to clean thoroughly and stretch.  Patient very reactive, especially to 3rd and 4th digits (PIP most limited) but tolerated a prolonged stretch to count of 10.  Gentle mobilization to PIP 3/4 then stretch into further extension.  Placed patient's right hand over left to have her attempt to self stretch - this she tolerated well.  Attempted to don left resting hand splint to which patient stated clearly  - "I don't want, and stop."  Respected patient's verbalizations and left brace off left hand.   Patient assisted to dress into clothing for the day, and left up in the wheelchair with tray on and safety belt in place and engaged, call bell on tray.    Therapy Documentation Precautions:  Precautions Precautions: Fall Precaution Comments: Contact precautions & delayed processing Restrictions Weight Bearing Restrictions: No   Pain: Pain Assessment Pain Scale: 0-10 Pain Score: 0-No pain      Therapy/Group: Individual Therapy  [name] 01/22/2023, 12:29 PM

## 2023-01-22 NOTE — Progress Notes (Signed)
PROGRESS NOTE   Subjective/Complaints: No events overnight EEG and Dr. Candi Leash interpretation reviewed- no seizure activity but there is moderate diffuse encephalopathy  ROS: Limited due to cognitive/behavioral   Objective:   EEG adult  Result Date: 01/21/2023 Charlsie Quest, MD     01/21/2023  8:37 PM Patient Name: Nicole [name] MRN: [id number] Epilepsy Attending: Charlsie Quest Referring Physician/Provider: Fanny Dance, MD Date: 01/21/2023 Duration: 24.32 mins Patient history: 47yo F with seizure like activity getting eeg to evaluate for seizure. Level of alertness: Awake AEDs during EEG study: None Technical aspects: This EEG study was done with scalp electrodes positioned according to the 10-20 International system of electrode placement. Electrical activity was reviewed with band pass filter of 1-70Hz , sensitivity of 7 uV/mm, display speed of 18mm/sec with a 60Hz  notched filter applied as appropriate. EEG data were recorded continuously and digitally stored.  Video monitoring was available and reviewed as appropriate. Description:  EEG showed continuous generalized predominantly 5 to 8 Hz theta- alpha activity admixed with intermittent 2-3Hz  delta slowing. Hyperventilation and photic stimulation were not performed.    Of note, EEG was technically difficult due to significant movement artifact.  ABNORMALITY - Continuous slow, generalized  IMPRESSION: This technically difficult study is suggestive of moderate diffuse encephalopathy. No seizures or epileptiform discharges were seen throughout the recording.  Charlsie Quest   Recent Labs    01/22/23 0636  WBC 8.1  HGB 15.5*  HCT 47.5*  PLT 274     Recent Labs    01/22/23 0636  NA 139  K 3.9  CL 104  CO2 26  GLUCOSE 90  BUN 14  CREATININE 0.62  CALCIUM 9.1       Intake/Output Summary (Last 24 hours) at 01/22/2023 1102 Last data filed at 01/22/2023 0809 Gross per 24  hour  Intake 956 ml  Output --  Net 956 ml      Pressure Injury 10/31/22 Buttocks Left Unstageable - Full thickness tissue loss in which the base of the injury is covered by slough (yellow, tan, gray, green or brown) and/or eschar (tan, brown or black) in the wound bed. (Active)  10/31/22 2000  Location: Buttocks  Location Orientation: Left  Staging: Unstageable - Full thickness tissue loss in which the base of the injury is covered by slough (yellow, tan, gray, green or brown) and/or eschar (tan, brown or black) in the wound bed.  Wound Description (Comments):   Present on Admission: Yes     Pressure Injury 11/18/22 Toe (Comment  which one) Anterior;Right Deep Tissue Pressure Injury - Purple or maroon localized area of discolored intact skin or blood-filled blister due to damage of underlying soft tissue from pressure and/or shear. small are (Active)  11/18/22 1300  Location: Toe (Comment  which one)  Location Orientation: Anterior;Right  Staging: Deep Tissue Pressure Injury - Purple or maroon localized area of discolored intact skin or blood-filled blister due to damage of underlying soft tissue from pressure and/or shear.  Wound Description (Comments): small area on right great toe metatarsal  Present on Admission: Yes    Physical Exam: Vital Signs Blood pressure (!) 125/94, pulse 88, temperature 98.1 F (36.7  C), temperature source Oral, resp. rate 18, height 5\' 1"  (1.549 m), weight 45.6 kg, SpO2 100%.  Gen: no distress, normal appearing, BMI 18.99 HEENT: oral mucosa pink and moist, NCAT, impaired cervical range of motion Cardio: HR normal rate and rhythm Chest: normal effort, normal rate of breathing Abd: soft, non-distended Ext: no edema Psych: Increased vocalizations, mostly nonsensical  Skin: + Scaling dried skin on head present-2 small excoriations, sore on the base of the 5th metatarsal head . stage 2 to right toe, area of redness between left thumb and index  finger-covered in dressing, left buttock unstageable area not viewed  Neurologic: Minimal engagement on exam, tired.  Does not follow simple commands. Delayed processing Dyskinetic movements and twitching noted in face and head Tone:  MAS 3 right elbow, MAS 3 knee flexors, and MAS 3 L>R  hip adductors  MAS 3 L elbow , MS 3-4 in wrist and finger flexors  + LUE WHO  Motor 0/5 strength in LUE and BLE, some purposeful movement in RUE ; reflexively grasps hand  Withdraws to pain in bilateral lower and right upper extremities. Musculoskeletal: reduced bilateral shoulder and elbow ROM due to increased tone  Right wrist swelling -Ace wrapped.   Assessment/Plan: 1. Functional deficits which require 3+ hours per day of interdisciplinary therapy in a comprehensive inpatient rehab setting. Physiatrist is providing close team supervision and 24 hour management of active medical problems listed below. Physiatrist and rehab team continue to assess barriers to discharge/monitor patient progress toward functional and medical goals  Care Tool:  Bathing    Body parts bathed by patient: Face, Abdomen, Chest   Body parts bathed by helper: Right arm, Left arm, Front perineal area, Buttocks, Right upper leg, Left upper leg, Right lower leg, Left lower leg Body parts n/a: Right arm, Left arm, Front perineal area, Buttocks, Right upper leg, Left upper leg, Right lower leg, Left lower leg   Bathing assist Assist Level: Maximal Assistance - Patient 24 - 49%     Upper Body Dressing/Undressing Upper body dressing   What is the patient wearing?: Pull over shirt    Upper body assist Assist Level: Total Assistance - Patient < 25%    Lower Body Dressing/Undressing Lower body dressing      What is the patient wearing?: Pants, Incontinence brief     Lower body assist Assist for lower body dressing: Dependent - Patient 0%     Toileting Toileting Toileting Activity did not occur (Clothing management  and hygiene only): N/A (no void or bm)  Toileting assist Assist for toileting: Total Assistance - Patient < 25%     Transfers Chair/bed transfer  Transfers assist  Chair/bed transfer activity did not occur: Safety/medical concerns  Chair/bed transfer assist level: Dependent - Patient 0%     Locomotion Ambulation   Ambulation assist   Ambulation activity did not occur: Safety/medical concerns  Assist level: 2 helpers   Max distance: 23ft   Walk 10 feet activity   Assist  Walk 10 feet activity did not occur: Safety/medical concerns  Assist level: 2 helpers     Walk 50 feet activity   Assist Walk 50 feet with 2 turns activity did not occur: Safety/medical concerns         Walk 150 feet activity   Assist Walk 150 feet activity did not occur: Safety/medical concerns         Walk 10 feet on uneven surface  activity   Assist Walk 10 feet on uneven surfaces  activity did not occur: Safety/medical concerns         Wheelchair     Assist Is the patient using a wheelchair?: Yes Type of Wheelchair: Manual    Wheelchair assist level: Dependent - Patient 0% Max wheelchair distance: 55ft    Wheelchair 50 feet with 2 turns activity    Assist    Wheelchair 50 feet with 2 turns activity did not occur: Safety/medical concerns   Assist Level: Dependent - Patient 0%   Wheelchair 150 feet activity     Assist  Wheelchair 150 feet activity did not occur: Safety/medical concerns   Assist Level: Dependent - Patient 0%   Blood pressure (!) 125/94, pulse 88, temperature 98.1 F (36.7 C), temperature source Oral, resp. rate 18, height 5\' 1"  (1.549 m), weight 45.6 kg, SpO2 100%.  Medical Problem List and Plan: 1. 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  Impaired mobility: continue Lovenox, weekly creatinine ordered             -antiplatelet therapy: N/A 3. Pain Management: Tylenol as needed  4. History of anxiety: discussed with her mom that she feels this was a big risk factor for her accident  5. Neuropsych/cognition: This patient is not capable of making decisions on her own behalf. 6. Left buttock unstageable ulcer.  continue Medihoney to buttock wound daily cover with foam dressing.  Changing dressing every 3 days or as needed soiling   7. Fluids/Electrolytes/Nutrition: Routine in and outs with follow-up chemistries  -Eating 100% most meals with total assist  -9/8 she is eating most of her meals with assistance   8.  Cavitary right lower lobe pneumonia likely aspiration pneumonia/MRSA pneumonia.  Resolved    Latest Ref Rng & Units 01/22/2023    6:36 AM 12/20/2022   10:54 AM 12/11/2022    7:15 AM  CBC  WBC 4.0 - 10.5 K/uL 8.1  5.6  9.0   Hemoglobin 12.0 - 15.0 g/dL 66.0  63.0  16.0   Hematocrit 36.0 - 46.0 % 47.5  42.8  45.1   Platelets 150 - 400 K/uL 274  253  329      9.  History of drug abuse.  Positive cocaine on urine drug screen.  Provide counseling  10.  AKI/hypovolemia and ATN.  resolved    Latest Ref Rng & Units 01/22/2023    6:36 AM 01/19/2023    6:37 AM 01/17/2023   11:59 AM  BMP  Glucose 70 -  99 mg/dL 90   109   BUN 6 - 20 mg/dL 14   12  Creatinine 0.44 - 1.00 mg/dL 6.04  5.40  9.81   Sodium 135 - 145 mmol/L 139   138   Potassium 3.5 - 5.1 mmol/L 3.9   3.8   Chloride 98 - 111 mmol/L 104   102   CO2 22 - 32 mmol/L 26   27   Calcium 8.9 - 10.3 mg/dL 9.1   9.0    Recheck BMP tomorrow  11.  Mild transaminitis with rhabdomyolysis.  Both resolved  12.  Hypotension. Resolved, d/c midodrine  13. Impaired initiation: continue amantadine 100mg  BID  14. Suboptimal vitamin D: continue 1,000U D3 daily  15. Spasticity:   -discussed serial casting with OT and her mom  - continue baclofen to 20mg  TID and tizanidine to 4mg  TID. Continue wrist, elbow brace, and PRAFOs. Ordered a night splint for LLE  Repeat CMP today given chronic tizanidine use  -9/7 continues to have significant tone despite use of tizanidine and baclofen, may be beneficial consider baclofen pump in outpatient setting if this causes significant difficulties in care  9/8 consider decrease baclofen if dyskinetic movements continue  16. Bowel and bladder incontinence: continue bowel and bladder program   17. Left hand skin maceration: Eucerin ordered, discussed hand split with OT, conitnue  18. History of magnesium deficiency: magnesium 500mg  started HS    01/21/2023    7:57 PM 01/21/2023    2:07 PM 01/21/2023    6:34 AM  Vitals with BMI  Systolic 125 117 191  Diastolic 94 87 93  Pulse 88 91 88  Recheck tomorrow  19. HTN: Increase tizanidine to 6mg  TID. Magnesium supplement added HS. Increase propanolol to 10mg  TID, decreased amantadine to 100mg  daily.  9/8 controlled overall, continue to monitor     01/21/2023    7:57 PM 01/21/2023    2:07 PM 01/21/2023    6:34 AM  Vitals with BMI  Systolic 125 117 478  Diastolic 94 87 93  Pulse 88 91 88     20. Area of redness between left thumb and index finger: continue foam dressing  21. Scalp eczema: selsun blue ordered, continue  -9/7 patient's mother feels like this  is causing her to itch, will start some hydrocortisone cream  22. Fatigue: improved, discussed with team may be secondary to anti-spasticity medications, B12 reviewed and is 335, in suboptimal range, will give 1,000U B12 injection 8/7, will order monthly as needed, metanx started, increase to BID, continue  23. Right wrist swelling: XR ordered and shows no acute fracture, shows 2.1mm ulnar variance, brace removed to given patient a break from it. Voltaren gel ordered prn for discomfort, continue, CT ordered and show no acute osseus injury, continue ace wrap  23. Dysphagia: continue D3/thins, d/c ensure as blood pressure has severely elevated since starting  24. MRSA nares positive: negative on repeat, precautions d/ced  25. Tachycardic: increase propanolol to BID.  Continue Tizanidine increased to 6mg  TID. Resolved, amantadine increased back to 100mg  BID  26.  Dyskinetic movements in face and head -Discussed medications with pharmacy.  Her amantadine dose was recently decreased, possible this was decreasing movement disorder.  Will increase amantadine back to 100 mg twice daily.  EEG reviewed and shows no evidence of seizure.  If no improvement could consider decreasing baclofen. Recheck labs tomorrow ordered.   27. Small sore on the base of 5th metatarsal head: local wound care   LOS: 65 days A FACE TO FACE EVALUATION WAS PERFORMED  Aloura Matsuoka P Amiya Escamilla 01/22/2023, 11:02 AM

## 2023-01-23 DIAGNOSIS — L409 Psoriasis, unspecified: Secondary | ICD-10-CM

## 2023-01-23 DIAGNOSIS — R1310 Dysphagia, unspecified: Secondary | ICD-10-CM

## 2023-01-23 LAB — MAGNESIUM: Magnesium: 2 mg/dL (ref 1.7–2.4)

## 2023-01-23 MED ORDER — CAMPHOR-MENTHOL 0.5-0.5 % EX LOTN
TOPICAL_LOTION | CUTANEOUS | Status: DC | PRN
  Filled 2023-01-23: qty 222

## 2023-01-23 NOTE — Progress Notes (Signed)
Physical Therapy Session Note  Patient Details  Name: Nicole [name] MRN: [id number] Date of Birth: [birth year]  Today's Date: 01/23/2023 PT Individual Time: 1300-1411 PT Individual Time Calculation (min): 71 min   Short Term Goals: Week 8: PT Short Term Goal 1 (Week 8): Pt will initiate bed mobility requiring no more than totalA. PT Short Term Goal 2 (Week 8): Pt will maintain sitting balance unsupported for up to 45 seconds PT Short Term Goal 3 (Week 8): Pt will demonstrate command follow for >10% of tasks. PT Short Term Goal 4 (Week 8): [name] RLE will demonstrate improved joint mobility into knee/ hip extension and ankle DF to neutral.  Skilled Therapeutic Interventions/Progress Updates:      Pt sitting upright in manual wheelchair with full lap tray - no safety concerns present. Patient transported to main rehab gym. Focused session on introducing [employer] Plus to help facilitate standing, upright, and pre-gait training as able. Walking sling donned while patient sat in wheelchair. Unable to grip platform handle on LUE due to joint tightness but able to reach RUE platform handle with hand over hand assist. Poor standing posture while in [place] Plus sling - sitting in sling with severe forward flexed head (downward gaze) and unable to provide adequate trunk support to help with stability in standing. Assisted her onto mat table with dependent squat pivot transfer. Worked for some time on achieving upright and midline while unsupported sitting - continued to demonstrate rigid posterior sacral tilting and posterior trunk lean with delayed righting responses and inability to correct LOB when occurs. Assisted into supine position dependently and then worked on therapy ball roll's in/out and side to side to aid in pelvic mobility. Returned to her w/c dependently with 1 person squat pivot transfer and transported back to her room where she concluded session seated upright in manual w/c with fully lap tray on and  pillows under elbows. All needs met.   Therapy Documentation Precautions:  Precautions Precautions: Fall Precaution Comments: Contact precautions & delayed processing Restrictions Weight Bearing Restrictions: No General:      Therapy/Group: Individual Therapy  Orrin Brigham 01/23/2023, 7:35 AM

## 2023-01-23 NOTE — Progress Notes (Signed)
Physical Therapy Session Note  Patient Details  Name: Nicole [name] MRN: [id number] Date of Birth: 11-15-1975  Today's Date: 01/23/2023 PT Individual Time: 1135-1202 PT Individual Time Calculation (min): 27 min   Short Term Goals: Week 7:  PT Short Term Goal 1 (Week 7): Pt will initiate bed mobility requiring no more than totalA. PT Short Term Goal 2 (Week 7): Pt will maintain sitting balance unsupported for up to 45 seconds PT Short Term Goal 3 (Week 7): Pt will demonstrate command follow for >10% of tasks. PT Short Term Goal 4 (Week 7): [name] RLE will demonstrate improved joint mobility into knee/ hip extension and ankle DF to neutral.  Skilled Therapeutic Interventions/Progress Updates: Patient sitting in WC on entrance to room. Patient alert and agreeable to PT session as pt said "yes" when asked if pt wanted to have PT. Pt also said "yes" when asked if pt recognized PTA on entrance. Pt presented with increased stimulus response throughout today's session by attempting to initiate certain movements such as leaning forward to scoot/stand. Pt transported to day room gym to stand in standing frame for WB in B LE's to increase proprioceptive feedback, and to stretch posterior musculature in B LE's with + 2 to adjust harness and to assist in B UE management on/off table. Pt this session with  noted heel on R LE closer to floor vs previous sessions with this pt and PTA as pt posterior R LE tightness prevented pt from having heel on ground. Pt in standing frame for close to 9 minutes with cues to look outside or to grab green cone, but had little to no physical response (noted to have eye movements to outside). Pt sat back into WC and was cued to lean onto PTA shoulder to assist in posterior scoot with pt saying "thank you," and "okay." Pt transported back to room in [facility] and was left with all needs in reach, belt alarm set, lap table, and brakes locked at end of session.       Therapy  Documentation Precautions:  Precautions Precautions: Fall Precaution Comments: Contact precautions & delayed processing Restrictions Weight Bearing Restrictions: No  Therapy/Group: Individual Therapy  Maydelin Deming PTA 01/23/2023, 1:03 PM

## 2023-01-23 NOTE — Progress Notes (Signed)
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 Level of alertness: Awake AEDs during EEG study: None Technical aspects: This EEG study was done with scalp electrodes positioned according to the 10-20 International system of electrode placement. Electrical activity was reviewed with band pass filter of 1-70Hz , sensitivity of 7 uV/mm, display speed of 21mm/sec with a 60Hz  notched filter applied as appropriate. EEG data were recorded continuously and digitally stored.  Video monitoring was available and reviewed as appropriate. Description:  EEG showed continuous generalized predominantly 5 to 8 Hz theta- alpha activity admixed with intermittent 2-3Hz  delta slowing. Hyperventilation and photic stimulation were not performed.    Of note, EEG was technically difficult due to significant movement artifact.  ABNORMALITY - Continuous slow, generalized  IMPRESSION: This technically difficult study is suggestive of moderate diffuse encephalopathy. No seizures or epileptiform discharges were seen throughout the recording.  Charlsie Quest   Recent Labs    01/22/23 0636  WBC 8.1  HGB 15.5*  HCT 47.5*  PLT 274     Recent Labs    01/22/23 0636  NA 139  K 3.9  CL 104  CO2 26  GLUCOSE 90  BUN 14  CREATININE 0.62  CALCIUM 9.1       Intake/Output Summary (Last 24 hours) at 01/23/2023 1019 Last data filed at 01/23/2023 1009 Gross per 24 hour   Intake 1938 ml  Output --  Net 1938 ml      Pressure Injury 10/31/22 Buttocks Left Unstageable - Full thickness tissue loss in which the base of the injury is covered by slough (yellow, tan, gray, green or brown) and/or eschar (tan, brown or black) in the wound bed. (Active)  10/31/22 2000  Location: Buttocks  Location Orientation: Left  Staging: Unstageable - Full thickness tissue loss in which the base of the injury is covered by slough (yellow, tan, gray, green or brown) and/or eschar (tan, brown or black) in the wound bed.  Wound Description (Comments):   Present on Admission: Yes     Pressure Injury 11/18/22 Toe (Comment  which one) Anterior;Right Deep Tissue Pressure Injury - Purple or maroon localized area of discolored intact skin or blood-filled blister due to damage of underlying soft tissue from pressure and/or shear. small are (Active)  11/18/22 1300  Location: Toe (Comment  which one)  Location Orientation: Anterior;Right  Staging: Deep Tissue Pressure Injury - Purple or maroon localized area of discolored intact skin or blood-filled blister due to damage of underlying soft tissue from pressure and/or shear.  Wound Description (Comments): small area on right great toe metatarsal  Present on Admission: Yes    Physical Exam: Vital Signs Blood pressure (!) 134/95, pulse 92, temperature 98.4 F (  36.9 C), temperature source Oral, resp. rate 18, height 5\' 1"  (1.549 m), weight 45.6 kg, SpO2 98%.  Gen: no distress, normal appearing, BMI 18.99 HEENT: oral mucosa pink and moist, NCAT, impaired cervical range of motion Cardio: HR normal rate and rhythm Chest: normal effort, normal rate of breathing Abd: soft, non-distended Ext: no edema Psych: Increased vocalizations, mostly nonsensical  Skin: + Scaling dried skin on head present-2 small excoriations, sore on the base of the 5th metatarsal head, rash on face . stage 2 to right toe, area of redness between left thumb and  index finger-covered in dressing, left buttock unstageable area not viewed  Neurologic: Minimal engagement on exam, tired.  Does not follow simple commands. Delayed processing Dyskinetic movements and twitching noted in face and head Tone:  MAS 3 right elbow, MAS 3 knee flexors, and MAS 3 L>R  hip adductors  MAS 3 L elbow , MS 3-4 in wrist and finger flexors  + LUE WHO  Motor 0/5 strength in LUE and BLE, some purposeful movement in RUE ; reflexively grasps hand  Withdraws to pain in bilateral lower and right upper extremities. Musculoskeletal: reduced bilateral shoulder and elbow ROM due to increased tone  Right wrist swelling -Ace wrapped.   Assessment/Plan: 1. Functional deficits which require 3+ hours per day of interdisciplinary therapy in a comprehensive inpatient rehab setting. Physiatrist is providing close team supervision and 24 hour management of active medical problems listed below. Physiatrist and rehab team continue to assess barriers to discharge/monitor patient progress toward functional and medical goals  Care Tool:  Bathing    Body parts bathed by patient: Face, Abdomen, Chest   Body parts bathed by helper: Right arm, Left arm, Front perineal area, Buttocks, Right upper leg, Left upper leg, Right lower leg, Left lower leg Body parts n/a: Right arm, Left arm, Front perineal area, Buttocks, Right upper leg, Left upper leg, Right lower leg, Left lower leg   Bathing assist Assist Level: Maximal Assistance - Patient 24 - 49%     Upper Body Dressing/Undressing Upper body dressing   What is the patient wearing?: Pull over shirt    Upper body assist Assist Level: Total Assistance - Patient < 25%    Lower Body Dressing/Undressing Lower body dressing      What is the patient wearing?: Pants, Incontinence brief     Lower body assist Assist for lower body dressing: Dependent - Patient 0%     Toileting Toileting Toileting Activity did not occur (Clothing  management and hygiene only): N/A (no void or bm)  Toileting assist Assist for toileting: Total Assistance - Patient < 25%     Transfers Chair/bed transfer  Transfers assist  Chair/bed transfer activity did not occur: Safety/medical concerns  Chair/bed transfer assist level: Dependent - Patient 0%     Locomotion Ambulation   Ambulation assist   Ambulation activity did not occur: Safety/medical concerns  Assist level: 2 helpers   Max distance: 34ft   Walk 10 feet activity   Assist  Walk 10 feet activity did not occur: Safety/medical concerns  Assist level: 2 helpers     Walk 50 feet activity   Assist Walk 50 feet with 2 turns activity did not occur: Safety/medical concerns         Walk 150 feet activity   Assist Walk 150 feet activity did not occur: Safety/medical concerns         Walk 10 feet on uneven surface  activity   Assist Walk 10  feet on uneven surfaces activity did not occur: Safety/medical concerns         Wheelchair     Assist Is the patient using a wheelchair?: Yes Type of Wheelchair: Manual    Wheelchair assist level: Dependent - Patient 0% Max wheelchair distance: 45ft    Wheelchair 50 feet with 2 turns activity    Assist    Wheelchair 50 feet with 2 turns activity did not occur: Safety/medical concerns   Assist Level: Dependent - Patient 0%   Wheelchair 150 feet activity     Assist  Wheelchair 150 feet activity did not occur: Safety/medical concerns   Assist Level: Dependent - Patient 0%   Blood pressure (!) 134/95, pulse 92, temperature 98.4 F (36.9 C), temperature source Oral, resp. rate 18, height 5\' 1"  (1.549 m), weight 45.6 kg, SpO2 98%.  Medical Problem List and Plan: 1. 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  Impaired mobility: continue Lovenox, weekly creatinine ordered             -antiplatelet therapy: N/A 3. Pain Management: Tylenol as needed  4. History of anxiety: discussed with her mom that she feels this was a big risk factor for her accident  5. Neuropsych/cognition: This patient is not capable of making decisions on her own behalf. 6. Left buttock unstageable ulcer.  continue Medihoney to buttock wound daily cover with foam dressing.  Changing dressing every 3 days or as needed soiling   7. Fluids/Electrolytes/Nutrition: Routine in and outs with follow-up chemistries  -Eating 100% most meals with total assist  -9/8 she is eating most of her meals with assistance   8.  Cavitary right lower lobe pneumonia likely aspiration pneumonia/MRSA pneumonia.  Resolved    Latest Ref Rng & Units 01/22/2023    6:36 AM 12/20/2022   10:54 AM 12/11/2022    7:15 AM  CBC  WBC 4.0 - 10.5 K/uL 8.1  5.6  9.0   Hemoglobin 12.0 - 15.0 g/dL 66.4  40.3  47.4   Hematocrit 36.0 - 46.0 % 47.5  42.8  45.1   Platelets 150 - 400 K/uL 274  253  329      9.  History of drug abuse.  Positive cocaine on urine drug screen.  Provide counseling  10.  AKI/hypovolemia and ATN.  resolved    Latest Ref Rng & Units 01/22/2023    6:36 AM 01/19/2023    6:37 AM 01/17/2023   11:59 AM  BMP   Glucose 70 - 99 mg/dL 90   259   BUN 6 - 20 mg/dL 14  12   Creatinine 0.44 - 1.00 mg/dL 7.82  9.56  2.13   Sodium 135 - 145 mmol/L 139   138   Potassium 3.5 - 5.1 mmol/L 3.9   3.8   Chloride 98 - 111 mmol/L 104   102   CO2 22 - 32 mmol/L 26   27   Calcium 8.9 - 10.3 mg/dL 9.1   9.0    Recheck BMP tomorrow  11.  Mild transaminitis with rhabdomyolysis.  Both resolved  12.  Hypotension. Resolved, d/c midodrine  13. Impaired initiation: continue amantadine 100mg  BID  14. Suboptimal vitamin D: continue 1,000U D3 daily  15. Spasticity:   -discussed serial casting with OT and her mom  - continue baclofen to 20mg  TID and tizanidine to 4mg  TID. Continue wrist, elbow brace, and PRAFOs. Ordered a night splint for LLE  Repeat CMP today given chronic tizanidine use  -9/7 continues to have significant tone despite use of tizanidine and baclofen, may be beneficial consider baclofen pump in outpatient setting if this causes significant difficulties in care  9/8 consider decrease baclofen if dyskinetic movements continue  16. Bowel and bladder incontinence: continue bowel and bladder program   17. Left hand skin maceration: Eucerin ordered, discussed hand split with OT, conitnue  18. History of magnesium deficiency: magnesium 500mg  started HS    01/23/2023    5:29 AM 01/22/2023    8:04 PM 01/22/2023    1:58 PM  Vitals with BMI  Systolic 134 142 086  Diastolic 95 97 84  Pulse 92 80 88  Recheck tomorrow  19. HTN: Increase tizanidine to 6mg  TID. Magnesium supplement added HS. Increase propanolol to 10mg  TID, decreased amantadine to 100mg  daily.  Add on magnesium level      01/23/2023    5:29 AM 01/22/2023    8:04 PM 01/22/2023    1:58 PM  Vitals with BMI  Systolic 134 142 578  Diastolic 95 97 84  Pulse 92 80 88     20. Area of redness between left thumb and index finger: continue foam dressing  21. Scalp eczema: selsun blue ordered, continue  -9/7 patient's mother feels like this is  causing her to itch, will start some hydrocortisone cream  22. Fatigue: improved, discussed with team may be secondary to anti-spasticity medications, B12 reviewed and is 335, in suboptimal range, will give 1,000U B12 injection 8/7, will order monthly as needed, metanx started, increase to BID, continue  23. Right wrist swelling: XR ordered and shows no acute fracture, shows 2.77mm ulnar variance, brace removed to given patient a break from it. Voltaren gel ordered prn for discomfort, continue, CT ordered and show no acute osseus injury, continue ace wrap  23. Dysphagia: continue D3/thins, d/c ensure as blood pressure has severely elevated since starting  24. MRSA nares positive: negative on repeat, precautions d/ced  25. Tachycardic: increase propanolol to BID.  Continue Tizanidine increased to 6mg  TID. Resolved, amantadine increased back to 100mg  BID  26.  Dyskinetic movements in face and head -Discussed medications with pharmacy.  Her amantadine dose was recently decreased, possible this was decreasing movement disorder.  Will increase amantadine back to 100 mg twice daily.  EEG reviewed and shows no evidence of seizure.  If no improvement could consider decreasing baclofen. Recheck labs tomorrow ordered.   27. Small sore on the base of 5th metatarsal head: local wound care  28. Rash on face: sarna lotion otdered   LOS: 66 days A FACE TO FACE EVALUATION WAS  PERFORMED  Horton Chin 01/23/2023, 10:19 AM

## 2023-01-23 NOTE — Progress Notes (Signed)
Occupational Therapy Session Note  Patient Details  Name: Nicole [name] MRN: [id number] Date of Birth: 05-04-1976  Today's Date: 01/23/2023 OT Individual Time: 7564-3329 OT Individual Time Calculation (min): 69 min    Short Term Goals: Week 9: OT Short Term Goal 1 (Week 9): Patient will change orientation of toothbrush/ hairbrush to accomodate task with min assist after set up OT Short Term Goal 2 (Week 9): Patient will spear food x 3 once utensil in hand, then bring to mouth with only contact assistance  Skilled Therapeutic Interventions/Progress Updates:    Pt received in bed having just been cleansed by nursing staff.  From bed level donned her pants and shirt and then she was moved to EOB with mod A of 1.  Pt responding "yes" or "yeah" when asked if she was okay with me moving her.  Mod A to hold sit position EOB and then +2 squat pivot to w/c.  Pt frequently reaching up to scratch head.  Used shampoo cap and then a slight amount of her psorasis shampoo with wash cloth on bare scalp areas and rinsed thoroughly. Helped comb through tangles that developed during the night.   Pt had not received breakfast yet. Focused rest of her session on STG #2. [name] full lap tray positioned on chair and plate put directly on lap tray for improved reach. Placed foam grip on fork.  Pt did need consistent help spearing the food as the food was a bit difficult to spear (french toast, eggs), she could spear the sausage pieces with hand over hand guidance.  She did consistently bring every bite of food from plate to mouth without any assist.   Tried to scoop some food with the spoon but she is not able to rotate the utensil to bring to mouth.  She may benefit from a swivel spoon.  NT arrived to help her finish continuing breakfast.  Pt in chair with belt alarm on and call light in reach.   Therapy Documentation Precautions:  Precautions Precautions: Fall Precaution Comments: Contact precautions & delayed  processing Restrictions Weight Bearing Restrictions: No    Vital Signs: Therapy Vitals Temp: 98.4 F (36.9 C) Temp Source: Oral Pulse Rate: 92 Resp: 18 BP: (!) 134/95 Patient Position (if appropriate): Lying Oxygen Therapy SpO2: 98 % O2 Device: Room Air Pain:  No c/o pain     Therapy/Group: Individual Therapy  [name] 01/23/2023, 9:23 AM

## 2023-01-23 NOTE — Progress Notes (Signed)
Speech Language Pathology Daily Session Note  Patient Details  Name: Nicole [name] MRN: [id number] Date of Birth: September 08, 1975  Today's Date: 01/23/2023 SLP Individual Time: 1550-1636 SLP Individual Time Calculation (min): 46 min  Short Term Goals: Week 9: SLP Short Term Goal 1 (Week 9): Pt will present w/ appropriate response to functional stimuli (i.e. accept spoon, grasp basic item appropriately, response to tactile/auditory stimuli) 20% of the time w/ maxA multimodal cues SLP Short Term Goal 2 (Week 9): Patient will follow 1-step commands within a context in 25% of opportunities with Max A multimodal cues SLP Short Term Goal 3 (Week 9): Patient will utilize multimodal communication as needed to answer basic yes/no questions with modA in 30% of opportunities  Skilled Therapeutic Interventions:   Pt greeted at bedside. She was awake/alert in her chair upon SLP arrival. SLP facilitated functional/conversational tasks to target verbal expression and auditory comprehension. Pt noted to respond to Y/N questions ~60% of the time w/ modA visual/verbal cues d/t delayed initiation of responses and/or inaccurate responses. She presented w/ 3 spontaneous sentence length utterances throughout tx session. 1/3 were intelligible. She continues to laugh and smile appropriately in conversation. When presented w/ open ended questions, she responded ~40% of the time. She demonstrated increased success w/ very functional questions re biographical info, current environment, or wants/needs vs mildly structured open ended questions.Additionally, perseverative head/nose scratching negatively impacted attention throughout tasks, and she benefited from mod-maxA verbal/tactile cues to reduce scratching. She was left in her chair with the alarm set and call light within reach. Recommend cont ST per POC.  Pain  Nodded "no" to indicate no pain.   Therapy/Group: Individual Therapy  [name] 01/23/2023, 5:19 PM

## 2023-01-24 DIAGNOSIS — R21 Rash and other nonspecific skin eruption: Secondary | ICD-10-CM

## 2023-01-24 NOTE — Progress Notes (Signed)
Speech Language Pathology Daily Session Note  Patient Details  Name: Nicole [name] MRN: [id number] Date of Birth: November 30, 1975  Today's Date: 01/24/2023 SLP Individual Time: 1300-1345 SLP Individual Time Calculation (min): 45 min  Short Term Goals: Week 9: SLP Short Term Goal 1 (Week 9): Pt will present w/ appropriate response to functional stimuli (i.e. accept spoon, grasp basic item appropriately, response to tactile/auditory stimuli) 20% of the time w/ maxA multimodal cues SLP Short Term Goal 2 (Week 9): Patient will follow 1-step commands within a context in 25% of opportunities with Max A multimodal cues SLP Short Term Goal 3 (Week 9): Patient will utilize multimodal communication as needed to answer basic yes/no questions with modA in 30% of opportunities  Skilled Therapeutic Interventions:   Greeted at bedside after noon meal. She was awake/alert in her bed and agreeable to functional tx tasks targeting cognition/language. SLP facilitated conversational tasks to target automatic responses. Intermittently, she presented w/ phrase length responses. Noted to verbalize Y/N for majority of responses vs head nods. She benefited from maxA tactile/verbal cueing to reduce perseverative head scratching (despite mitt) and movement tics. She was able to complete 2/5 commands re facial movements and groping noted during 3/5 trials. She demonstrated adequate awareness of tics/perseverative scratching saying "I'm trying not to but I can't." SLP assisted pt with partial hair wash using her psoriasis shampoo. She was able to follow very functional commands during task w/ modA verbal cues. At the end of the tx session, she verbally answered binary choice questions x2 to identify preferred TV channel. She was left in her bed with the alarm set and call light within reach. Recommend cont ST per POC.   Pain  Nodded "no" to report no pain  Therapy/Group: Individual Therapy  [name] 01/24/2023, 5:45 PM

## 2023-01-24 NOTE — Progress Notes (Signed)
Speech Language Pathology Daily Session Note  Patient Details  Name: Nicole [name] MRN: [id number] Date of Birth: 02/09/1976  Today's Date: 01/24/2023 SLP Treatment Time: 1000-1100 Treatment Time Calculation: 60 min    Short Term Goals: Week 9: SLP Short Term Goal 1 (Week 9): Pt will present w/ appropriate response to functional stimuli (i.e. accept spoon, grasp basic item appropriately, response to tactile/auditory stimuli) 20% of the time w/ maxA multimodal cues SLP Short Term Goal 2 (Week 9): Patient will follow 1-step commands within a context in 25% of opportunities with Max A multimodal cues SLP Short Term Goal 3 (Week 9): Patient will utilize multimodal communication as needed to answer basic yes/no questions with modA in 30% of opportunities  Skilled Therapeutic Interventions: Skilled co-treatment with OT focused on communication and cognitive goals. Upon arrival, patient was awake in bed and agreeable to treatment session. Patient was transferred to the standard wheelchair and brought to SLP office. SLP facilitated session by providing extra time and Max A multimodal cues for patient to answer both yes/no questions and open-ended questions related to current task. Patient with decreased verbal output today compared to other sessions and unable to answer any open-ended questions with patient answering yes/no questions with vocalizations vs true verbalizations in 50% of opportunities. Patient followed basic commands throughout painting task with Min verbal cues but required Mod verbal and tactile cues for initiation and Max verbal and tactile cues for attention to painting task as patient distracted by wiping her nose and scratching her head consistently throughout session. Patient returned back to bed at end of session with all needs within reach. Continue with current plan of care.      Pain Intermittent grimacing with repositioning but no specific reports of pain   Therapy/Group:  Individual Therapy  Rafeef Lau 01/24/2023, 3:11 PM

## 2023-01-24 NOTE — Patient Care Conference (Signed)
Inpatient RehabilitationTeam Conference and Plan of Care Update Date: 01/24/2023   Time: 11:12 AM    Patient Name: Nicole [name]      Medical Record Number: [id number]  Date of Birth: 1976-04-13 Sex: Female         Room/Bed: 4W09C/4W09C-01 Payor Info: Payor: [employer] / Plan: [employer] / Product Type: *No Product type* /    Admit Date/Time:  11/18/2022  3:15 PM  Primary Diagnosis:  Hypoxic brain injury [facility])  Hospital Problems: Principal Problem:   Hypoxic brain injury (HCC) Active Problems:   Protein-calorie malnutrition, severe    Expected Discharge Date: Expected Discharge Date:  (SNF pending)  Team Members Present: Physician leading conference: Dr. Sula Soda Social Worker Present: Dossie Der, LCSW Nurse Present: Chana Bode, RN PT Present: Wynelle Link, PT OT Present: Roney Mans, OT SLP Present: [name], SLP PPS Coordinator present : Fae Pippin, SLP     Current Status/Progress Goal Weekly Team Focus  Bowel/Bladder   Incontinent of bowel and bladder LBM 01/23/23   Regain Continence   Time toileting 2-4hrs, [place] [facility]    Swallow/Nutrition/ Hydration               ADL's   continues to be dependent to max A with BADLS and mod A with eating.  improving interaction and verbal responses. can use R hand to reach to mouth and head. Improving head control   max assist   participation in BADL,  postural/ head control    Mobility   Dependent care for functional mobility. Has improved verbalizations and is starting to form short sentences but often unintelligible.   TotA overall  Barriers: slow improvement in progress and awareness; contractures; incontinence; no family/caregiver support    Communication   Answers Y/N questions w/ modA ~60% of the time; Answers very functional open ended questions ~30% of the time, depending on time of day and fatigue level; Demonstrates adequate comprehension of simple conversation via  spontaneous responses, smiling, laughing, etc; still difficult to follow structured commands of any kind given physical limitations   Answer functional open eneded questions ~50% of the time   Open ended questions re very familiar info, her evironment, facilitation of automatic responses, following commands re facial movement    Safety/Cognition/ Behavioral Observations  slightly increased consistency w/ alertness/responsiveness/interaction w/ environment; initiated functional Y/N and open ended questions re orientation/memory, though difficult to truly assess given severity of communication deficits.   answer Y/N questions to ID orientation information w/ maxA   sustained attention, tolerance of auditory/visual stimuli, orientation information, functional tasks to target initiation    Pain      Unable to assess with exception of tearing with ROM of hand and grimacing    Pain managed < 4 with prns Assess for signs of pain non-verbal and verbally utilizing gestures or physical changes    Skin   Pressure wound stage 2 continue honey dressing per orders   No new wounds or presssure ulcers,prevention messures  Continue to assess QS/PRN,  Air mattress added back, off loading with bracing and stretching    Discharge Planning:  Continue to awiat disabilty determination so medicaid can be approved and then will place in SNF. Attempted three times to call Medicaid worker with no return call, will ask to speak with supervisior   Team Discussion: Patient continues to be dependent for care with new dyskinetic movements noted post adjustment of amantadine for blood pressure issues.  No functional status change  noted; continue stretching and bracing.  Patient on target to meet rehab goals: Note increased alsertness and un-intelligible verbalizations (clenched teeth) and occasionally get up 2-3 word phrases. Is 60% accurate with yes/no responses. Note increased pain in left and with ROM.  *See Care  Plan and progress notes for long and short-term goals.   Revisions to Treatment Plan:  Change to q day therapy schedule   Teaching Needs: Safety, Medications, skin care/wound care, off loading, transfers, toileting, etc.  Current Barriers to Discharge: Incontinence, Wound care, and Lack of/limited family support  Possible Resolutions to Barriers: Disability application pending     Medical Summary Current Status: pressure injury worsening, hypertension, rash on face, eczema    Barriers to Discharge Comments: pressure injury worsening, hypertension, rash on face, eczema Possible Resolutions to [employer]: discussed with nursing importance of offloading, air mattress ordered, monitor BP TID, continue tizanidine and baclofen, continue selsun   Continued Need for Acute Rehabilitation Level of Care: The patient requires daily medical management by a physician with specialized training in physical medicine and rehabilitation for the following reasons: Direction of a multidisciplinary physical rehabilitation program to maximize functional independence : Yes Medical management of patient stability for increased activity during participation in an intensive rehabilitation regime.: Yes Analysis of laboratory values and/or radiology reports with any subsequent need for medication adjustment and/or medical intervention. : Yes   I attest that I was present, lead the team conference, and concur with the assessment and plan of the team.   Chana Bode B 01/24/2023, 3:37 PM

## 2023-01-24 NOTE — Plan of Care (Signed)
[name] plan of care adjusted to QD after speaking with care team and discussed with MD in team conference due to slow to minimal progress with OT, PT, and SLP.

## 2023-01-24 NOTE — Progress Notes (Signed)
PROGRESS NOTE   Subjective/Complaints: No new complaints this morning [facility] notified regarding worsening pressure injury  ROS: Limited due to cognitive/behavioral   Objective:   No results found. Recent Labs    01/22/23 0636  WBC 8.1  HGB 15.5*  HCT 47.5*  PLT 274     Recent Labs    01/22/23 0636  NA 139  K 3.9  CL 104  CO2 26  GLUCOSE 90  BUN 14  CREATININE 0.62  CALCIUM 9.1       Intake/Output Summary (Last 24 hours) at 01/24/2023 1047 Last data filed at 01/23/2023 2132 Gross per 24 hour  Intake 560 ml  Output --  Net 560 ml      Pressure Injury 10/31/22 Buttocks Left Unstageable - Full thickness tissue loss in which the base of the injury is covered by slough (yellow, tan, gray, green or brown) and/or eschar (tan, brown or black) in the wound bed. (Active)  10/31/22 2000  Location: Buttocks  Location Orientation: Left  Staging: Unstageable - Full thickness tissue loss in which the base of the injury is covered by slough (yellow, tan, gray, green or brown) and/or eschar (tan, brown or black) in the wound bed.  Wound Description (Comments):   Present on Admission: Yes     Pressure Injury 11/18/22 Toe (Comment  which one) Anterior;Right Deep Tissue Pressure Injury - Purple or maroon localized area of discolored intact skin or blood-filled blister due to damage of underlying soft tissue from pressure and/or shear. small are (Active)  11/18/22 1300  Location: Toe (Comment  which one)  Location Orientation: Anterior;Right  Staging: Deep Tissue Pressure Injury - Purple or maroon localized area of discolored intact skin or blood-filled blister due to damage of underlying soft tissue from pressure and/or shear.  Wound Description (Comments): small area on right great toe metatarsal  Present on Admission: Yes    Physical Exam: Vital Signs Blood pressure 103/72, pulse 91, temperature (!) 97.5 F  (36.4 C), temperature source Oral, resp. rate 19, height 5\' 1"  (1.549 m), weight 45.6 kg, SpO2 100%.  Gen: no distress, normal appearing, BMI 18.99 HEENT: oral mucosa pink and moist, NCAT, impaired cervical range of motion Cardio: HR normal rate and rhythm Chest: normal effort, normal rate of breathing Abd: soft, non-distended Ext: no edema Psych: Increased vocalizations, mostly nonsensical, but saying yes appropriately!  Skin: + Scaling dried skin on head present-2 small excoriations, sore on the base of the 5th metatarsal head, rash on face . stage 2 to right toe, area of redness between left thumb and index finger-covered in dressing, left buttock unstageable area not viewed  Neurologic: Minimal engagement on exam, tired.  Does not follow simple commands. Delayed processing Dyskinetic movements and twitching noted in face and head Tone:  MAS 3 right elbow, MAS 3 knee flexors, and MAS 3 L>R  hip adductors  MAS 3 L elbow , MS 3-4 in wrist and finger flexors  + LUE WHO  Motor 0/5 strength in LUE and BLE, some purposeful movement in RUE ; reflexively grasps hand  Withdraws to pain in bilateral lower and right upper extremities. Musculoskeletal: reduced bilateral shoulder and elbow ROM  due to increased tone  Right wrist swelling -Ace wrapped.   Assessment/Plan: 1. Functional deficits which require 3+ hours per day of interdisciplinary therapy in a comprehensive inpatient rehab setting. Physiatrist is providing close team supervision and 24 hour management of active medical problems listed below. Physiatrist and rehab team continue to assess barriers to discharge/monitor patient progress toward functional and medical goals  Care Tool:  Bathing    Body parts bathed by patient: Face, Abdomen, Chest   Body parts bathed by helper: Right arm, Left arm, Front perineal area, Buttocks, Right upper leg, Left upper leg, Right lower leg, Left lower leg Body parts n/a: Right arm, Left arm,  Front perineal area, Buttocks, Right upper leg, Left upper leg, Right lower leg, Left lower leg   Bathing assist Assist Level: Maximal Assistance - Patient 24 - 49%     Upper Body Dressing/Undressing Upper body dressing   What is the patient wearing?: Pull over shirt    Upper body assist Assist Level: Total Assistance - Patient < 25%    Lower Body Dressing/Undressing Lower body dressing      What is the patient wearing?: Pants, Incontinence brief     Lower body assist Assist for lower body dressing: Dependent - Patient 0%     Toileting Toileting Toileting Activity did not occur (Clothing management and hygiene only): N/A (no void or bm)  Toileting assist Assist for toileting: Total Assistance - Patient < 25%     Transfers Chair/bed transfer  Transfers assist  Chair/bed transfer activity did not occur: Safety/medical concerns  Chair/bed transfer assist level: Dependent - Patient 0%     Locomotion Ambulation   Ambulation assist   Ambulation activity did not occur: Safety/medical concerns  Assist level: 2 helpers   Max distance: 76ft   Walk 10 feet activity   Assist  Walk 10 feet activity did not occur: Safety/medical concerns  Assist level: 2 helpers     Walk 50 feet activity   Assist Walk 50 feet with 2 turns activity did not occur: Safety/medical concerns         Walk 150 feet activity   Assist Walk 150 feet activity did not occur: Safety/medical concerns         Walk 10 feet on uneven surface  activity   Assist Walk 10 feet on uneven surfaces activity did not occur: Safety/medical concerns         Wheelchair     Assist Is the patient using a wheelchair?: Yes Type of Wheelchair: Manual    Wheelchair assist level: Dependent - Patient 0% Max wheelchair distance: 27ft    Wheelchair 50 feet with 2 turns activity    Assist    Wheelchair 50 feet with 2 turns activity did not occur: Safety/medical concerns   Assist  Level: Dependent - Patient 0%   Wheelchair 150 feet activity     Assist  Wheelchair 150 feet activity did not occur: Safety/medical concerns   Assist Level: Dependent - Patient 0%   Blood pressure 103/72, pulse 91, temperature (!) 97.5 F (36.4 C), temperature source Oral, resp. rate 19, height 5\' 1"  (1.549 m), weight 45.6 kg, SpO2 100%.  Medical Problem List and Plan: 1. 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  Impaired mobility: continue Lovenox, weekly creatinine ordered             -antiplatelet therapy: N/A 3. Pain Management: Tylenol as needed  4. History of anxiety: discussed with her mom that she feels this was a big risk factor for her accident  5. Neuropsych/cognition: This patient is not capable of making decisions on her own behalf. 6. Left buttock unstageable ulcer.  continue Medihoney to buttock wound daily cover with foam dressing.  Changing dressing every 3 days or as needed soiling   7. Fluids/Electrolytes/Nutrition: Routine in and outs with follow-up chemistries  -Eating 100% most meals with total assist  -9/8 she is eating  most of her meals with assistance   8.  Cavitary right lower lobe pneumonia likely aspiration pneumonia/MRSA pneumonia.  Resolved    Latest Ref Rng & Units 01/22/2023    6:36 AM 12/20/2022   10:54 AM 12/11/2022    7:15 AM  CBC  WBC 4.0 - 10.5 K/uL 8.1  5.6  9.0   Hemoglobin 12.0 - 15.0 g/dL 28.4  13.2  44.0   Hematocrit 36.0 - 46.0 % 47.5  42.8  45.1   Platelets 150 - 400 K/uL 274  253  329      9.  History of drug abuse.  Positive cocaine on urine drug screen.  Provide counseling  10.  AKI/hypovolemia and ATN.  resolved    Latest Ref Rng & Units 01/22/2023    6:36 AM 01/19/2023    6:37 AM 01/17/2023   11:59 AM  BMP  Glucose 70 - 99 mg/dL 90   102   BUN 6 - 20 mg/dL 14   12   Creatinine 7.25 - 1.00 mg/dL 3.66  4.40  3.47   Sodium 135 - 145 mmol/L 139   138   Potassium 3.5 - 5.1 mmol/L 3.9   3.8   Chloride 98 - 111 mmol/L 104   102   CO2 22 - 32 mmol/L 26   27   Calcium 8.9 - 10.3 mg/dL 9.1   9.0     11.  Mild transaminitis with rhabdomyolysis.  Both resolved  12.  Hypotension. Resolved, d/c midodrine  13. Impaired initiation: continue amantadine 100mg  BID  14. Suboptimal vitamin D: continue 1,000U D3 daily  15. Spasticity:   -discussed serial casting with OT and her mom  - continue baclofen to 20mg  TID and tizanidine to 4mg  TID. Continue wrist, elbow brace, and PRAFOs. Ordered a night splint for LLE  Repeat CMP today given chronic tizanidine use  -9/7 continues to have significant tone despite use of tizanidine and baclofen, may be beneficial consider baclofen pump in outpatient setting if this causes significant difficulties in care  9/8 consider decrease baclofen if dyskinetic movements continue  16. Bowel and bladder incontinence: continue bowel and bladder  program   17. Left hand skin maceration: Eucerin ordered, discussed hand split with OT, conitnue  18. History of magnesium deficiency: magnesium 500mg  started HS    01/24/2023    3:46 AM 01/23/2023    9:18 PM  01/23/2023    2:19 PM  Vitals with BMI  Systolic 103 96 109  Diastolic 72 68 76  Pulse 91 80 91  Recheck tomorrow  19. HTN: Increase tizanidine to 6mg  TID. Magnesium supplement added HS. Increase propanolol to 10mg  TID, decreased amantadine to 100mg  daily.  Add on magnesium level      01/24/2023    3:46 AM 01/23/2023    9:18 PM 01/23/2023    2:19 PM  Vitals with BMI  Systolic 103 96 109  Diastolic 72 68 76  Pulse 91 80 91     20. Area of redness between left thumb and index finger: continue foam dressing  21. Scalp eczema: selsun blue ordered, continue  -9/7 patient's mother feels like this is causing her to itch, will start some hydrocortisone cream  22. Fatigue: improved, discussed with team may be secondary to anti-spasticity medications, B12 reviewed and is 335, in suboptimal range, will give 1,000U B12 injection 8/7, will order monthly as needed, metanx started, increase to BID, continue  23. Right wrist swelling: XR ordered and shows no acute fracture, shows 2.55mm ulnar variance, brace removed to given patient a break from it. Voltaren gel ordered prn for discomfort, continue, CT ordered and show no acute osseus injury, continue ace wrap  23. Dysphagia: continue D3/thins, d/c ensure as blood pressure has severely elevated since starting  24. MRSA nares positive: negative on repeat, precautions d/ced  25. Tachycardic: increase propanolol to BID.  Continue Tizanidine increased to 6mg  TID. Resolved, amantadine increased back to 100mg  BID  26.  Dyskinetic movements in face and head -Discussed medications with pharmacy.  Her amantadine dose was recently decreased, possible this was decreasing movement disorder.  Will increase amantadine back to 100 mg twice daily.  EEG reviewed and shows no evidence of seizure.  If no improvement could consider decreasing baclofen. Recheck labs tomorrow ordered.   27. Small sore on the base of 5th metatarsal head: local wound care,  contnue  28. Rash on face: sarna lotion ordered, continue  29. Worsening pressure injury: air mattress ordered   LOS: 67 days A FACE TO FACE EVALUATION WAS PERFORMED  Nicole [name] 01/24/2023, 10:47 AM

## 2023-01-24 NOTE — Progress Notes (Signed)
Physical Therapy Session Note  Patient Details  Name: Nicole [name] MRN: [id number] Date of Birth: Oct 30, 1975  Today's Date: 01/24/2023 PT Individual Time: 0800-0900 + [id number] PT Individual Time Calculation (min): 60 min  + 78 min  Short Term Goals: Week 8: PT Short Term Goal 1 (Week 8): Pt will initiate bed mobility requiring no more than totalA. PT Short Term Goal 2 (Week 8): Pt will maintain sitting balance unsupported for up to 45 seconds PT Short Term Goal 3 (Week 8): Pt will demonstrate command follow for >10% of tasks. PT Short Term Goal 4 (Week 8): [name] RLE will demonstrate improved joint mobility into knee/ hip extension and ankle DF to neutral.  Skilled Therapeutic Interventions/Progress Updates:      1st session: Pt lying in bed to start - noted to be incontinent of BM. Dependent care at bed level for pericare and brief change. Noted sacral wound to be larger than it was a few days ago. Messaged team via secure chat to order air mattress and encourage consistent turning in bed. Also will DC manual w/c and return to TIS w/c for pressure relief purposes.   Breakfast tray arriving late - incorporated feeding into therapy to ensure warm meal. Pt repositioned and then hospital bed placed into chair position. Patient's meal positioned in front of her - needing hand-over-hand assist with built-up handle to facilitate self feeding her D3 meal. Cues needed for chewing, swallowing, and terminating sucking threw straw while drinking.   Session concluded with patient left in upright chair position in bed. Alarm on, all needs met    2nd session: Pt in bed to start - now on air mattress. Pt noted to be incontinent of bladder - dependent care for pericare and brief change at bed level. Pt assisted to w/c with dependent squat pivot transfer. Transported to day room rehab gym to focus remainder of time on NMR.  Pt transitioned from wheelchair to tall kneeling on soft mat on floor with +2  assist and 3rd person bracing large therapy ball. In tall kneeling, used bolster b/w legs to promote abduction and prevent genu valgus. While in tall kneeling, worked on upright hip extension, cervical extension, and shoulder abduction by "hugging" therapy ball. All aspects of positioning required +2 assist.   Pt transitioned from tall kneeling with therapy ball into long sitting position with +2 assist. Worked on stretching posterior chain in long kneeling using +2 assist for supporting her trunk and 2nd person stretching BLE into abduction and extension.   From long sitting, transitioned to sitting on top of large therapy ball with +2 assist. Pt needing totalA for managing sitting balance on therapy ball and for maintaining pelvic obliquity to facilitate anterior pelvic tilt. Delayed to absent righting while sitting on ball.   Transitioned then to full supine position on mat with +2 assist. Stretched hip abd/add as well as hip rotators with figure-4 technique.   Pt returned to w/c with +2 assist and then back to bed in supine position. B comfy braces donned to elbows, adjustable nights splints to B feet. Pt noted to be incontinent x2 - NT notified at end of session. All needs met.     Therapy Documentation Precautions:  Precautions Precautions: Fall Precaution Comments: Contact precautions & delayed processing Restrictions Weight Bearing Restrictions: No General:     Therapy/Group: Individual Therapy  Marg Macmaster P Charelle Petrakis PT 01/24/2023, 7:44 AM

## 2023-01-24 NOTE — Progress Notes (Signed)
Occupational Therapy Session Note  Patient Details  Name: BURNADETTE SHIMA MRN: [id number] Date of Birth: [birth year]  Today's Date: 01/24/2023 OT Individual Time: 0930-1000 OT Individual Time Calculation (min): 30 min    Short Term Goals: Week 1:  OT Short Term Goal 1 (Week 1): Pt will initiate simple ADL task with Max multimodal cuing. OT Short Term Goal 1 - Progress (Week 1): Not met OT Short Term Goal 2 (Week 1): Pt will attend to ADL task for >2 min in distraction-free environment. OT Short Term Goal 2 - Progress (Week 1): Not met OT Short Term Goal 3 (Week 1): Pt will express preference between two options in preparation for self-advocacy during medical care. OT Short Term Goal 3 - Progress (Week 1): Not met  Skilled Therapeutic Interventions/Progress Updates:    Pt received in bed. She had just been changed by PT and needed to get dressed. Pt assisted with dressing, washing of neck and L hand. Pt has some discomfort with finger extension with hand cleansing.  I apologized and explained to her why I was working on her ROM.  Pt stated "I understand". Had bed in flat position and had pt work on relaxing head so she was able to come into neutral neck position.  Left collar off as pt is developing improved neck control.   Due to sore on her bottom, positioned pt into R sidelying position with pillows to help position her. Pt nodded yes that she was comfortable after being  positioned.  Bed alarm set and all needs met.   Therapy Documentation Precautions:  Precautions Precautions: Fall Precaution Comments: Contact precautions & delayed processing Restrictions Weight Bearing Restrictions: No   Pain: Pain Assessment Pain Scale: 0-10 Pain Score: 0-No pain ADL: ADL Eating: Maximal assistance Where Assessed-Eating: Chair Grooming: Dependent Where Assessed-Grooming: Bed level Upper Body Bathing: Dependent Where Assessed-Upper Body Bathing: Bed level Lower Body Bathing: Dependent Where  Assessed-Lower Body Bathing: Bed level Upper Body Dressing: Dependent Where Assessed-Upper Body Dressing: Bed level Lower Body Dressing: Dependent Where Assessed-Lower Body Dressing: Bed level Toileting: Dependent Where Assessed-Toileting: Bed level Toilet Transfer: Unable to assess Psychologist, counselling Transfer: Unable to assess   Therapy/Group: Individual Therapy  Calieb Lichtman 01/24/2023, 10:20 AM

## 2023-01-25 MED ORDER — JUVEN PO PACK
1.0000 | PACK | Freq: Two times a day (BID) | ORAL | Status: DC
  Administered 2023-01-25 – 2023-01-26 (×3): 1 via ORAL
  Filled 2023-01-25 (×3): qty 1

## 2023-01-25 MED ORDER — KATE FARMS STANDARD 1.4 PO LIQD
325.0000 mL | Freq: Three times a day (TID) | ORAL | Status: DC
  Administered 2023-01-25 – 2023-01-26 (×3): 325 mL via ORAL
  Filled 2023-01-25 (×6): qty 325

## 2023-01-25 NOTE — Progress Notes (Signed)
Speech Language Pathology Daily Session Note  Patient Details  Name: Nicole [name] MRN: [id number] Date of Birth: [birth year]  Today's Date: 01/25/2023 SLP Individual Time: 4098-1191 SLP Individual Time Calculation (min): 27 min  Short Term Goals: Week 9: SLP Short Term Goal 1 (Week 9): Pt will present w/ appropriate response to functional stimuli (i.e. accept spoon, grasp basic item appropriately, response to tactile/auditory stimuli) 20% of the time w/ maxA multimodal cues SLP Short Term Goal 2 (Week 9): Patient will follow 1-step commands within a context in 25% of opportunities with Max A multimodal cues SLP Short Term Goal 3 (Week 9): Patient will utilize multimodal communication as needed to answer basic yes/no questions with modA in 30% of opportunities  Skilled Therapeutic Interventions:   Pt greeted at bedside. She was awake/alert in her TIS chair upon SLP arrival. Tx tasks targeted cognition and language. SLP assisted pt to the SLP office via wc. Throughout attempted structured (object/picture description) and conversational tasks, pt presented w/ significantly reduced initiation of head nods, verbal Y/N responses and open ended responses. No initiation of spontaneous speech noted. She required maxA visual/verbal cues, including multiple repetitions and additional processing time, to respond to simple Y/N questions re pt environment and wants/needs. In addition to reduced initiation of verbal responses, she presented with reduced tracking/eye contact during tx session. She was assisted back to her room and left in her chair with the alarm set. Call light within reach. Recommend cont ST per POC.   Pain  Reported no pain via head nod.   Therapy/Group: Individual Therapy  [name] 01/25/2023, 4:42 PM

## 2023-01-25 NOTE — Progress Notes (Signed)
Nutrition Follow-up  DOCUMENTATION CODES:   Severe malnutrition in context of acute illness/injury  INTERVENTION:  - Add -1 packet Juven BID, each packet provides 95 calories, 2.5 grams of protein (collagen), and 9.8 grams of carbohydrate (3 grams sugar); also contains 7 grams of L-arginine and L-glutamine, 300 mg vitamin C, 15 mg vitamin E, 1.2 mcg vitamin B-12, 9.5 mg zinc, 200 mg calcium, and 1.5 g  Calcium Beta-hydroxy-Beta-methylbutyrate to support wound healing  - Add [employer] 1.4 PO TID, each supplement provides 455 kcal and 20 grams protein.  NUTRITION DIAGNOSIS:   Severe Malnutrition related to acute illness as evidenced by moderate fat depletion, moderate muscle depletion.  GOAL:   Patient will meet greater than or equal to 90% of their needs  MONITOR:   PO intake, Supplement acceptance  REASON FOR ASSESSMENT:   Malnutrition Screening Tool    ASSESSMENT:   47 y.o. 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 PMH includes: anxiety, asthma, depression.  Meds reviewed: Vit C, Vit D3, magnesium gluconate, MVI. Labs reviewed: WDL.   Pt with increased nutrient needs related to wound healing and severe malnutrition. Last weight was taken 2 weeks ago, requested new bed weight for today. RD also ordered daily weights. Per MD note, pt's wound is worsening. RD had previously ordered Ensure supplements but they were discontinued due to high blood pressure. Supplements should not be contributing to high blood pressure. RD will add Molli Posey TID to provide pt with extra calories and protein. RD will add Juven BID as well to promote in wound healing. Pt continues with PO intakes at 75-100%. RD will continue to monitor PO intakes.   Diet Order:   Diet Order             DIET DYS 3 Room service appropriate? Yes; Fluid consistency: Thin  Diet effective now                   EDUCATION  NEEDS:   Not appropriate for education at this time  Skin:  Skin Assessment: Skin Integrity Issues: Skin Integrity Issues:: Unstageable Unstageable: L buttocks  Last BM:  9/11 - type 6  Height:   Ht Readings from Last 1 Encounters:  11/18/22 5\' 1"  (1.549 m)    Weight:   Wt Readings from Last 1 Encounters:  01/11/23 45.6 kg    Ideal Body Weight:     BMI:  Body mass index is 18.99 kg/m.  Estimated Nutritional Needs:   Kcal:  1600-1800  Protein:  80-100 gm  Fluid:  >/= 1.6 L  Bethann Humble, RD, LDN, CNSC.

## 2023-01-25 NOTE — Progress Notes (Signed)
Occupational Therapy Session Note  Patient Details  Name: Nicole [name] MRN: [id number] Date of Birth: 03-Jul-1975  Today's Date: 01/25/2023 OT Individual Time: 1115-1200 OT Individual Time Calculation (min): 45 min    Short Term Goals: Week 9: OT Short Term Goal 1 (Week 9): Patient will change orientation of toothbrush/ hairbrush to accomodate task with min assist after set up OT Short Term Goal 2 (Week 9): Patient will spear food x 3 once utensil in hand, then bring to mouth with only contact assistance  Skilled Therapeutic Interventions/Progress Updates:   Patient received awake and alert in bed.  Patient agreeable to getting out of bed but states "Not sure I had breakfast yet."  Patient with exaggerated facial twitching - eyes and mouth this session.  Patient assisted to edge of bed - worked on sitting balance and scooting forward to allow feet to touch the floor.  Transferred patient to tilt in space wheelchair and transported to nursing station to request ice cream.  Transported to dayroom and patient able to feed herself magic cup with intermittent min assist to orient spoon in hand.  Patient even scooping icecream from dish with spoon! Patient with multiple brief vocalizations today, although not all were audible/ comprehendible.   Patient left up in wheelchair for lunch.    Therapy Documentation Precautions:  Precautions Precautions: Fall Precaution Comments: Contact precautions & delayed processing Restrictions Weight Bearing Restrictions: No  Pain: No indication of pain at start of session    Therapy/Group: Individual Therapy  [name] 01/25/2023, 3:48 PM

## 2023-01-25 NOTE — Progress Notes (Signed)
PROGRESS NOTE   Subjective/Complaints: No new complaints this morning Ensure restarted with Juven as well appreciate dietician communication Tolerated therapy well today   ROS: Limited due to cognitive/behavioral   Objective:   No results found. No results for input(s): "WBC", "HGB", "HCT", "PLT" in the last 72 hours.    No results for input(s): "NA", "K", "CL", "CO2", "GLUCOSE", "BUN", "CREATININE", "CALCIUM" in the last 72 hours.      Intake/Output Summary (Last 24 hours) at 01/25/2023 1228 Last data filed at 01/24/2023 2244 Gross per 24 hour  Intake 1396 ml  Output --  Net 1396 ml      Pressure Injury 10/31/22 Buttocks Left Unstageable - Full thickness tissue loss in which the base of the injury is covered by slough (yellow, tan, gray, green or brown) and/or eschar (tan, brown or black) in the wound bed. (Active)  10/31/22 2000  Location: Buttocks  Location Orientation: Left  Staging: Unstageable - Full thickness tissue loss in which the base of the injury is covered by slough (yellow, tan, gray, green or brown) and/or eschar (tan, brown or black) in the wound bed.  Wound Description (Comments):   Present on Admission: Yes     Pressure Injury 11/18/22 Toe (Comment  which one) Anterior;Right Deep Tissue Pressure Injury - Purple or maroon localized area of discolored intact skin or blood-filled blister due to damage of underlying soft tissue from pressure and/or shear. small are (Active)  11/18/22 1300  Location: Toe (Comment  which one)  Location Orientation: Anterior;Right  Staging: Deep Tissue Pressure Injury - Purple or maroon localized area of discolored intact skin or blood-filled blister due to damage of underlying soft tissue from pressure and/or shear.  Wound Description (Comments): small area on right great toe metatarsal  Present on Admission: Yes    Physical Exam: Vital Signs Blood pressure 136/89,  pulse 98, temperature 98 F (36.7 C), resp. rate 19, height 5\' 1"  (1.549 m), weight 45.6 kg, SpO2 98%.  Gen: no distress, normal appearing, BMI 18.99 HEENT: oral mucosa pink and moist, NCAT, impaired cervical range of motion Cardio: HR normal rate and rhythm Chest: normal effort, normal rate of breathing Abd: soft, non-distended Ext: no edema Psych: Increased vocalizations, mostly nonsensical, but saying yes appropriately!  Skin: + Scaling dried skin on head present-2 small excoriations, sore on the base of the 5th metatarsal head, rash on face . stage 2 to right toe, area of redness between left thumb and index finger-covered in dressing, left buttock unstageable area not viewed  Neurologic: Minimal engagement on exam, tired.  Does not follow simple commands. Delayed processing Dyskinetic movements and twitching noted in face and head Tone:  MAS 3 right elbow, MAS 3 knee flexors, and MAS 3 L>R  hip adductors  MAS 3 L elbow , MS 3-4 in wrist and finger flexors  + LUE WHO  Motor 0/5 strength in LUE and BLE, some purposeful movement in RUE ; reflexively grasps hand Improved cervical extension  Withdraws to pain in bilateral lower and right upper extremities. Musculoskeletal: reduced bilateral shoulder and elbow ROM due to increased tone  Right wrist swelling -Ace wrapped.   Assessment/Plan: 1. Functional deficits  which require 3+ hours per day of interdisciplinary therapy in a comprehensive inpatient rehab setting. Physiatrist is providing close team supervision and 24 hour management of active medical problems listed below. Physiatrist and rehab team continue to assess barriers to discharge/monitor patient progress toward functional and medical goals  Care Tool:  Bathing    Body parts bathed by patient: Face, Abdomen, Chest   Body parts bathed by helper: Right arm, Left arm, Front perineal area, Buttocks, Right upper leg, Left upper leg, Right lower leg, Left lower leg Body  parts n/a: Right arm, Left arm, Front perineal area, Buttocks, Right upper leg, Left upper leg, Right lower leg, Left lower leg   Bathing assist Assist Level: Maximal Assistance - Patient 24 - 49%     Upper Body Dressing/Undressing Upper body dressing   What is the patient wearing?: Pull over shirt    Upper body assist Assist Level: Total Assistance - Patient < 25%    Lower Body Dressing/Undressing Lower body dressing      What is the patient wearing?: Pants, Incontinence brief     Lower body assist Assist for lower body dressing: Dependent - Patient 0%     Toileting Toileting Toileting Activity did not occur (Clothing management and hygiene only): N/A (no void or bm)  Toileting assist Assist for toileting: Total Assistance - Patient < 25%     Transfers Chair/bed transfer  Transfers assist  Chair/bed transfer activity did not occur: Safety/medical concerns  Chair/bed transfer assist level: Dependent - Patient 0%     Locomotion Ambulation   Ambulation assist   Ambulation activity did not occur: Safety/medical concerns  Assist level: 2 helpers   Max distance: 12ft   Walk 10 feet activity   Assist  Walk 10 feet activity did not occur: Safety/medical concerns  Assist level: 2 helpers     Walk 50 feet activity   Assist Walk 50 feet with 2 turns activity did not occur: Safety/medical concerns         Walk 150 feet activity   Assist Walk 150 feet activity did not occur: Safety/medical concerns         Walk 10 feet on uneven surface  activity   Assist Walk 10 feet on uneven surfaces activity did not occur: Safety/medical concerns         Wheelchair     Assist Is the patient using a wheelchair?: Yes Type of Wheelchair: Manual    Wheelchair assist level: Dependent - Patient 0% Max wheelchair distance: 37ft    Wheelchair 50 feet with 2 turns activity    Assist    Wheelchair 50 feet with 2 turns activity did not occur:  Safety/medical concerns   Assist Level: Dependent - Patient 0%   Wheelchair 150 feet activity     Assist  Wheelchair 150 feet activity did not occur: Safety/medical concerns   Assist Level: Dependent - Patient 0%   Blood pressure 136/89, pulse 98, temperature 98 F (36.7 C), resp. rate 19, height 5\' 1"  (1.549 m), weight 45.6 kg, SpO2 98%.  Medical Problem List and Plan: 1. 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  Impaired mobility: continue Lovenox, weekly creatinine ordered             -antiplatelet therapy: N/A 3. Pain Management: Tylenol as needed  4. History of anxiety: discussed with her mom that she feels this was a big risk factor for her accident  5. Neuropsych/cognition: This patient is not capable of making decisions on her own behalf. 6. Left buttock unstageable ulcer.  continue Medihoney to buttock wound daily cover with foam dressing.  Changing dressing every 3 days or as needed soiling   7. Fluids/Electrolytes/Nutrition: Routine in and outs with follow-up chemistries  -Eating 100% most meals with total assist  -9/8 she is  eating most of her meals with assistance   8.  Cavitary right lower lobe pneumonia likely aspiration pneumonia/MRSA pneumonia.  Resolved    Latest Ref Rng & Units 01/22/2023    6:36 AM 12/20/2022   10:54 AM 12/11/2022    7:15 AM  CBC  WBC 4.0 - 10.5 K/uL 8.1  5.6  9.0   Hemoglobin 12.0 - 15.0 g/dL 95.2  84.1  32.4   Hematocrit 36.0 - 46.0 % 47.5  42.8  45.1   Platelets 150 - 400 K/uL 274  253  329      9.  History of drug abuse.  Positive cocaine on urine drug screen.  Provide counseling  10.  AKI/hypovolemia and ATN.  resolved    Latest Ref Rng & Units 01/22/2023    6:36 AM 01/19/2023    6:37 AM 01/17/2023   11:59 AM  BMP  Glucose 70 - 99 mg/dL 90   401   BUN 6 - 20 mg/dL 14   12   Creatinine 0.27 - 1.00 mg/dL 2.53  6.64  4.03   Sodium 135 - 145 mmol/L 139   138   Potassium 3.5 - 5.1 mmol/L 3.9   3.8   Chloride 98 - 111 mmol/L 104   102   CO2 22 - 32 mmol/L 26   27   Calcium 8.9 - 10.3 mg/dL 9.1   9.0     11.  Mild transaminitis with rhabdomyolysis.  Both resolved  12.  Hypotension. Resolved, d/c midodrine  13. Impaired initiation: continue amantadine 100mg  BID  14. Suboptimal vitamin D: continue 1,000U D3 daily  15. Spasticity:   -discussed serial casting with OT and her mom  - continue baclofen to 20mg  TID and tizanidine to 4mg  TID. Continue wrist, elbow brace, and PRAFOs. Ordered a night splint for LLE  Repeat CMP today given chronic tizanidine use  -9/7 continues to have significant tone despite use of tizanidine and baclofen, may be beneficial consider baclofen pump in outpatient setting if this causes significant difficulties in care  9/8 consider decrease baclofen if dyskinetic movements continue  16. Bowel and bladder incontinence: continue bowel and bladder program   17. Left hand skin maceration: Eucerin ordered, discussed hand split with OT, conitnue  18. History of  magnesium deficiency: magnesium 500mg  started HS    01/25/2023    4:21 AM 01/24/2023    8:17 PM  01/24/2023    5:15 PM  Vitals with BMI  Systolic 136 120 161  Diastolic 89 72 80  Pulse 98 83 84  Recheck tomorrow  19. HTN: Increase tizanidine to 6mg  TID. Magnesium supplement added HS. Increase propanolol to 10mg  TID, decreased amantadine to 100mg  daily.  Add on magnesium level      01/25/2023    4:21 AM 01/24/2023    8:17 PM 01/24/2023    5:15 PM  Vitals with BMI  Systolic 136 120 096  Diastolic 89 72 80  Pulse 98 83 84     20. Area of redness between left thumb and index finger: continue foam dressing  21. Scalp eczema: selsun blue ordered, continue  -9/7 patient's mother feels like this is causing her to itch, will start some hydrocortisone cream  22. Fatigue: improved, discussed with team may be secondary to anti-spasticity medications, B12 reviewed and is 335, in suboptimal range, will give 1,000U B12 injection 8/7, will order monthly as needed, metanx started, increase to BID, continue  23. Right wrist swelling: XR ordered and shows no acute fracture, shows 2.62mm ulnar variance, brace removed to given patient a break from it. Voltaren gel ordered prn for discomfort, continue, CT ordered and show no acute osseus injury, continue ace wrap  23. Dysphagia: continue D3/thins, d/c ensure as blood pressure has severely elevated since starting  24. MRSA nares positive: negative on repeat, precautions d/ced  25. Tachycardic: increase propanolol to BID.  Continue Tizanidine increased to 6mg  TID. Resolved, amantadine increased back to 100mg  BID, continue this dose  26.  Dyskinetic movements in face and head -Discussed medications with pharmacy.  Her amantadine dose was recently decreased, possible this was decreasing movement disorder.  Will increase amantadine back to 100 mg twice daily.  EEG reviewed and shows no evidence of seizure.  If no improvement could consider decreasing baclofen. Recheck labs tomorrow ordered.   27. Small sore on the base of 5th metatarsal head: local  wound care, continue  28. Rash on face: sarna lotion ordered, continue  29. Worsening pressure injury: air mattress ordered, Ensure and Juven ordered   LOS: 68 days A FACE TO FACE EVALUATION WAS PERFORMED  Drema Pry Aleni Andrus 01/25/2023, 12:28 PM

## 2023-01-25 NOTE — Progress Notes (Signed)
Physical Therapy Weekly Progress Note  Patient Details  Name: Nicole [name] MRN: [id number] Date of Birth: 08-16-1975  Beginning of progress report period: January 17, 2023 End of progress report period: January 25, 2023  Today's Date: 01/25/2023 PT Individual Time: 6440-3474 PT Individual Time Calculation (min): 45 min   Patient has met 0 of 4 short term goals.  [name] continues to show slow progress in therapy this past reporting period. She has shown improvement in verbalization, cervical extension, and ROM in her elbows and hips. She continues to be severely impaired and dependent care for all functional mobility. Therapy frequency has been decreased but still being seen for management and facilitating mobility.   Patient continues to demonstrate the following deficits muscle weakness and muscle joint tightness, decreased cardiorespiratoy endurance, impaired timing and sequencing, abnormal tone, unbalanced muscle activation, and motor apraxia, decreased visual perceptual skills and decreased visual motor skills, decreased motor planning, decreased initiation, decreased attention, decreased awareness, decreased problem solving, decreased safety awareness, decreased memory, and delayed processing, and decreased sitting balance, decreased standing balance, decreased postural control, and decreased balance strategies and therefore will continue to benefit from skilled PT intervention to increase functional independence with mobility.  Patient progressing toward long term goals..  Continue plan of care.  PT Short Term Goals Week 9: PT Short Term Goal 1 (Week 9): Pt will initiate bed mobility with totalA PT Short Term Goal 2 (Week 9): Pt will maintain unsupported sitting balance for 30 seconds without LOB PT Short Term Goal 3 (Week 9): Pt will complete bed<>chair transfers with no more than totalA  Skilled Therapeutic Interventions/Progress Updates:       Pt lying in bed to start - noted  to be incontinent of bladder with heavily saturated brief. Depedent care for pericare and brief change at bed level. Breakfast tray arriving late so this was incorporated in therapy session. HOB elevated > 30deg. Built up handle for fork to help with grip. Patient able to feed herself (bringing food to mouth) once food was placed on the fork - pt difficulty grading GMC with hand to mouth. Pt eating 75% of her meal and 12oz of juice. Pt repositioned in bed, left with HOB flat and in R sidelying for pressure relief. Pillows b/w knees/ankles to protect bony prominences. All needs met.    Therapy Documentation Precautions:  Precautions Precautions: Fall Precaution Comments: Contact precautions & delayed processing Restrictions Weight Bearing Restrictions: No General:     Therapy/Group: Individual Therapy  Orrin Brigham 01/25/2023, 7:49 AM

## 2023-01-25 NOTE — Progress Notes (Signed)
Patient ID: Nicole [name], female   DOB: 1976/03/10, 47 y.o.   MRN: [id number]  Met with Mom who was here to give team conference update. Still trying to call medicaid worker regarding status and will see if can call social security on my own.

## 2023-01-26 LAB — CREATININE, SERUM
Creatinine, Ser: 0.48 mg/dL (ref 0.44–1.00)
GFR, Estimated: >60 mL/min (ref >60)

## 2023-01-26 NOTE — Progress Notes (Signed)
PROGRESS NOTE   Subjective/Complaints: Resting comfortably Tolerated therapy well today Patient's chart reviewed- No issues reported overnight Blood pressure has increased again   ROS: Limited due to cognitive/behavioral   Objective:   No results found. No results for input(s): "WBC", "HGB", "HCT", "PLT" in the last 72 hours.    No results for input(s): "NA", "K", "CL", "CO2", "GLUCOSE", "BUN", "CREATININE", "CALCIUM" in the last 72 hours.      Intake/Output Summary (Last 24 hours) at 01/26/2023 1332 Last data filed at 01/26/2023 0700 Gross per 24 hour  Intake 824 ml  Output --  Net 824 ml      Pressure Injury 10/31/22 Buttocks Left Unstageable - Full thickness tissue loss in which the base of the injury is covered by slough (yellow, tan, gray, green or brown) and/or eschar (tan, brown or black) in the wound bed. (Active)  10/31/22 2000  Location: Buttocks  Location Orientation: Left  Staging: Unstageable - Full thickness tissue loss in which the base of the injury is covered by slough (yellow, tan, gray, green or brown) and/or eschar (tan, brown or black) in the wound bed.  Wound Description (Comments):   Present on Admission: Yes     Pressure Injury 11/18/22 Toe (Comment  which one) Anterior;Right Deep Tissue Pressure Injury - Purple or maroon localized area of discolored intact skin or blood-filled blister due to damage of underlying soft tissue from pressure and/or shear. small are (Active)  11/18/22 1300  Location: Toe (Comment  which one)  Location Orientation: Anterior;Right  Staging: Deep Tissue Pressure Injury - Purple or maroon localized area of discolored intact skin or blood-filled blister due to damage of underlying soft tissue from pressure and/or shear.  Wound Description (Comments): small area on right great toe metatarsal  Present on Admission: Yes    Physical Exam: Vital Signs Blood pressure  (!) 131/97, pulse 98, temperature 98.7 F (37.1 C), temperature source Oral, resp. rate 20, height 5\' 1"  (1.549 m), weight 45.6 kg, SpO2 98%.  Gen: no distress, normal appearing, BMI 18.99 HEENT: oral mucosa pink and moist, NCAT, impaired cervical range of motion, dyskinetic facial movements Cardio: HR normal rate and rhythm Chest: normal effort, normal rate of breathing Abd: soft, non-distended Ext: no edema Psych: Increased vocalizations, mostly nonsensical, but saying yes appropriately!  Skin: + Scaling dried skin on head present-2 small excoriations, sore on the base of the 5th metatarsal head, rash on face . stage 2 to right toe, area of redness between left thumb and index finger-covered in dressing, left buttock unstageable area not viewed  Neurologic: Minimal engagement on exam, tired.  Does not follow simple commands. Delayed processing Dyskinetic movements and twitching noted in face and head Tone:  MAS 3 right elbow, MAS 3 knee flexors, and MAS 3 L>R  hip adductors  MAS 3 L elbow , MS 3-4 in wrist and finger flexors  + LUE WHO  Motor 0/5 strength in LUE and BLE, some purposeful movement in RUE ; reflexively grasps hand Improved cervical extension  Withdraws to pain in bilateral lower and right upper extremities. Musculoskeletal: reduced bilateral shoulder and elbow ROM due to increased tone  Right wrist swelling -Ace  wrapped.   Assessment/Plan: 1. Functional deficits which require 3+ hours per day of interdisciplinary therapy in a comprehensive inpatient rehab setting. Physiatrist is providing close team supervision and 24 hour management of active medical problems listed below. Physiatrist and rehab team continue to assess barriers to discharge/monitor patient progress toward functional and medical goals  Care Tool:  Bathing    Body parts bathed by patient: Face, Abdomen, Chest   Body parts bathed by helper: Right arm, Left arm, Front perineal area, Buttocks, Right  upper leg, Left upper leg, Right lower leg, Left lower leg Body parts n/a: Right arm, Left arm, Front perineal area, Buttocks, Right upper leg, Left upper leg, Right lower leg, Left lower leg   Bathing assist Assist Level: Maximal Assistance - Patient 24 - 49%     Upper Body Dressing/Undressing Upper body dressing   What is the patient wearing?: Pull over shirt    Upper body assist Assist Level: Total Assistance - Patient < 25%    Lower Body Dressing/Undressing Lower body dressing      What is the patient wearing?: Pants, Incontinence brief     Lower body assist Assist for lower body dressing: Dependent - Patient 0%     Toileting Toileting Toileting Activity did not occur (Clothing management and hygiene only): N/A (no void or bm)  Toileting assist Assist for toileting: Total Assistance - Patient < 25%     Transfers Chair/bed transfer  Transfers assist  Chair/bed transfer activity did not occur: Safety/medical concerns  Chair/bed transfer assist level: Dependent - Patient 0%     Locomotion Ambulation   Ambulation assist   Ambulation activity did not occur: Safety/medical concerns  Assist level: 2 helpers   Max distance: 65ft   Walk 10 feet activity   Assist  Walk 10 feet activity did not occur: Safety/medical concerns  Assist level: 2 helpers     Walk 50 feet activity   Assist Walk 50 feet with 2 turns activity did not occur: Safety/medical concerns         Walk 150 feet activity   Assist Walk 150 feet activity did not occur: Safety/medical concerns         Walk 10 feet on uneven surface  activity   Assist Walk 10 feet on uneven surfaces activity did not occur: Safety/medical concerns         Wheelchair     Assist Is the patient using a wheelchair?: Yes Type of Wheelchair: Manual    Wheelchair assist level: Dependent - Patient 0% Max wheelchair distance: 36ft    Wheelchair 50 feet with 2 turns  activity    Assist    Wheelchair 50 feet with 2 turns activity did not occur: Safety/medical concerns   Assist Level: Dependent - Patient 0%   Wheelchair 150 feet activity     Assist  Wheelchair 150 feet activity did not occur: Safety/medical concerns   Assist Level: Dependent - Patient 0%   Blood pressure (!) 131/97, pulse 98, temperature 98.7 F (37.1 C), temperature source Oral, resp. rate 20, height 5\' 1"  (1.549 m), weight 45.6 kg, SpO2 98%.  Medical Problem List and Plan: 1. 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  Impaired mobility: continue Lovenox, weekly creatinine ordered             -antiplatelet therapy: N/A 3. Pain Management: Tylenol as needed  4. History of anxiety: discussed with her mom that she feels this was a big risk factor for her accident  5. Neuropsych/cognition: This patient is not capable of making decisions on her own behalf. 6. Left buttock unstageable ulcer.  continue Medihoney to buttock wound daily cover with foam dressing.  Changing dressing every 3 days or as needed soiling   7.  Fluids/Electrolytes/Nutrition: Routine in and outs with follow-up chemistries  -Eating 100% most meals with total assist  -9/8 she is eating most of her meals with assistance   8.  Cavitary right lower lobe pneumonia likely aspiration pneumonia/MRSA pneumonia.  Resolved    Latest Ref Rng & Units 01/22/2023    6:36 AM 12/20/2022   10:54 AM 12/11/2022    7:15 AM  CBC  WBC 4.0 - 10.5 K/uL 8.1  5.6  9.0   Hemoglobin 12.0 - 15.0 g/dL 98.1  19.1  47.8   Hematocrit 36.0 - 46.0 % 47.5  42.8  45.1   Platelets 150 - 400 K/uL 274  253  329      9.  History of drug abuse.  Positive cocaine on urine drug screen.  Provide counseling  10.  AKI/hypovolemia and ATN.  resolved    Latest Ref Rng & Units 01/22/2023    6:36 AM 01/19/2023    6:37 AM 01/17/2023   11:59 AM  BMP  Glucose 70 - 99 mg/dL 90   295   BUN 6 - 20 mg/dL 14   12   Creatinine 6.21 - 1.00 mg/dL 3.08  6.57  8.46   Sodium 135 - 145 mmol/L 139   138   Potassium 3.5 - 5.1 mmol/L 3.9   3.8   Chloride 98 - 111 mmol/L 104   102   CO2 22 - 32 mmol/L 26   27   Calcium 8.9 - 10.3 mg/dL 9.1   9.0     11.  Mild transaminitis with rhabdomyolysis.  Both resolved  12.  Hypotension. Resolved, d/c midodrine  13. Impaired initiation: continue amantadine 100mg  BID  14. Suboptimal vitamin D: continue 1,000U D3 daily  15. Spasticity:   -discussed serial casting with OT and her mom  - continue baclofen to 20mg  TID and tizanidine to 4mg  TID. Continue wrist, elbow brace, and PRAFOs. Ordered a night splint for LLE  Repeat CMP today given chronic tizanidine use  -9/7 continues to have significant tone despite use of tizanidine and baclofen, may be beneficial consider baclofen pump in outpatient setting if this causes significant difficulties in care  9/8 consider decrease baclofen if dyskinetic movements continue  16. Bowel and bladder incontinence: continue bowel and bladder program   17. Left hand skin maceration: Eucerin ordered, discussed hand  split with OT, conitnue  18. History  of magnesium deficiency: magnesium 500mg  started HS    01/26/2023    1:22 PM 01/26/2023   11:35 AM 01/26/2023    5:36 AM  Vitals with BMI  Systolic 131 128 161  Diastolic 97 87 96  Pulse 98 100 86  Recheck tomorrow  19. HTN: Increase tizanidine to 6mg  TID. Magnesium supplement added HS. Increase propanolol to 10mg  TID, Magnesium reviewed and stable. Appears to be stable and elevate when nutritional supplements are added, d/ced supplements since patient is eating meals well and in case they are contributing to gastric distress that she cannot express or insulin resistance/HTN     01/26/2023    1:22 PM 01/26/2023   11:35 AM 01/26/2023    5:36 AM  Vitals with BMI  Systolic 131 128 096  Diastolic 97 87 96  Pulse 98 100 86     20. Area of redness between left thumb and index finger: continue foam dressing  21. Scalp eczema: selsun blue ordered, continue  -9/7 patient's mother feels like this is causing her to itch, will start some hydrocortisone cream  22. Fatigue: improved, discussed with team may be secondary to anti-spasticity medications, B12 reviewed and is 335, in suboptimal range, will give 1,000U B12 injection 8/7, will order monthly as needed, metanx started, increase to BID, continue  23. Right wrist swelling: XR ordered and shows no acute fracture, shows 2.73mm ulnar variance, brace removed to given patient a break from it. Voltaren gel ordered prn for discomfort, continue, CT ordered and show no acute osseus injury, continue ace wrap  23. Dysphagia: continue D3/thins, d/c ensure as blood pressure has severely elevated since starting  24. MRSA nares positive: negative on repeat, precautions d/ced  25. Tachycardic: increase propanolol to BID.  Continue Tizanidine increased to 6mg  TID. Resolved, amantadine increased back to 100mg  BID, continue this dose  26.  Dyskinetic movements in face and head -Discussed medications with pharmacy.  Her  amantadine dose was recently decreased, possible this was decreasing movement disorder.  Will increase amantadine back to 100 mg twice daily.  EEG reviewed and shows no evidence of seizure.  -will consider decreasing baclofen if these continue  27. Small sore on the base of 5th metatarsal head: local wound care, continue  28. Rash on face: sarna lotion ordered, continue  29. Sacral pressure injury: air mattress ordered, Placed nursing order to offload q1 hour while awake   LOS: 69 days A FACE TO FACE EVALUATION WAS PERFORMED  Clarkson Rosselli P Jaelen Gellerman 01/26/2023, 1:32 PM

## 2023-01-26 NOTE — Progress Notes (Signed)
Occupational Therapy Weekly Progress Note  Patient Details  Name: Nicole [name] MRN: [id number] Date of Birth: 09-10-1975  Beginning of progress report period: January 17, 2023 End of progress report period: January 26, 2023  Today's Date: 01/26/2023 OT Individual Time: 7829-5621 OT Individual Time Calculation (min): 30 min    Patient has met 2 of 2 short term goals.    Patient continues to demonstrate the following deficits: muscle weakness and muscle joint tightness, decreased cardiorespiratoy endurance, impaired timing and sequencing, abnormal tone, unbalanced muscle activation, decreased coordination, and decreased motor planning, decreased visual perceptual skills and decreased visual motor skills, decreased midline orientation, decreased attention to right, and decreased motor planning, decreased initiation, decreased attention, decreased awareness, decreased problem solving, decreased safety awareness, decreased memory, and delayed processing, and decreased sitting balance, decreased standing balance, decreased postural control, hemiplegia, and decreased balance strategies and therefore will continue to benefit from skilled OT intervention to enhance overall performance with BADL and Reduce care partner burden.  Patient progressing toward long term goals..  Continue plan of care.  OT Short Term Goals Week 10:  OT Short Term Goal 1 (Week 1): Patient will reach forward to grasp familiar item needed for ADL task with min assist OT Short Term Goal 1 - Progress (Week 1): Not met OT Short Term Goal 2 (Week 1): Patient will indicate preference or need 3/5 times in session by verbal response, or head nod/shake OT Short Term Goal 2 - Progress (Week 1): Not met OT Short Term Goal 3 (Week 1): Patient will demonstrate 75% digit extension in left hand in preparation for left hand grasp/release OT Short Term Goal 3 - Progress (Week 1): Not met  Skilled Therapeutic Interventions/Progress Updates:     Patient received supine in bed positioned in extension with bracing on all limbs.  Patient assisted to sitting then onto shower chair.  Transported into bathroom for shower - patient able to assist with washing hair, face, chest and abdomen.  Patient with excessive facial movements that do not seem to be related to discomfort - dyskinetic movement.   Patient assisted back to bed and dressed due to shorter therapy session. Left in bed in flat position.  Mitt on right hand, and palm protector left hand.  Bed alarm engaged, and call bell nearby.      Therapy Documentation Precautions:  Precautions Precautions: Fall Precaution Comments: Contact precautions & delayed processing Restrictions Weight Bearing Restrictions: No   Pain:  Flinches during positional changes to all limbs      Therapy/Group: Individual Therapy  [name] 01/26/2023, 12:35 PM

## 2023-01-26 NOTE — Progress Notes (Signed)
Physical Therapy Session Note  Patient Details  Name: YAREXY DELSORDO MRN: [id number] Date of Birth: [birth year]  Today's Date: 01/26/2023 PT Individual Time: 0900-0930 PT Individual Time Calculation (min): 30 min   Short Term Goals: Week 9: PT Short Term Goal 1 (Week 9): Pt will initiate bed mobility with totalA PT Short Term Goal 2 (Week 9): Pt will maintain unsupported sitting balance for 30 seconds without LOB PT Short Term Goal 3 (Week 9): Pt will complete bed<>chair transfers with no more than totalA  Skilled Therapeutic Interventions/Progress Updates:      Pt in bed, curled into flexion in sidelying, almost in fetal position, with HOB elevated. Pt noted to also be incontinent x2 with heavily saturated brief. Dependent care for brief change and bed mobility. Pt further incontinent of bladder while doing pericare, needing further cleaning and linen change. Pt stretched into extension with dynamic night splints on B feet and donned R cosey arm splint. Left in bed with HOB flat, all needs met.    Therapy Documentation Precautions:  Precautions Precautions: Fall Precaution Comments: Contact precautions & delayed processing Restrictions Weight Bearing Restrictions: No General:     Therapy/Group: Individual Therapy  Jesyca Weisenburger P Phuc Kluttz PT 01/26/2023, 7:52 AM

## 2023-01-26 NOTE — Progress Notes (Signed)
Speech Language Pathology Weekly Progress and Session Note  Patient Details  Name: Nicole [name] MRN: [id number] Date of Birth: 10/02/1975  Beginning of progress report period: January 18, 2023 End of progress report period: January 26, 2023  Today's Date: 01/26/2023 SLP Individual Time: 1345-1416 SLP Individual Time Calculation (min): 31 min  Short Term Goals: Week 9: SLP Short Term Goal 1 (Week 9): Pt will present w/ appropriate response to functional stimuli (i.e. accept spoon, grasp basic item appropriately, response to tactile/auditory stimuli) 20% of the time w/ maxA multimodal cues Status: Met SLP Short Term Goal 2 (Week 9): Patient will follow 1-step commands within a context in 25% of opportunities with Max A multimodal cues Status: Met SLP Short Term Goal 3 (Week 9): Patient will utilize multimodal communication as needed to answer basic yes/no questions with modA in 30% of opportunities Status: Met    New Short Term Goals: Week 9: SLP Short Term Goal 1 (Week 9): Week 10 - Pt will present w/ appropriate response to functional stimuli (i.e. accept spoon, grasp basic item appropriately, response to tactile/auditory stimuli) 40% of the time w/ maxA multimodal cues SLP Short Term Goal 2 (Week 9): Week 10 - Pt will answer open ended questions w/ modA in 50% of opportunities SLP Short Term Goal 3 (Week 9): Week 10 - Pt will answer basic yes/no questions with modA in 50% of opportunities SLP Short Term Goal 4 (Week 9): Week 10 - Pt will utilize increased vocal intensity w/ maxA verbal cues to remain 70% intelligibile at phrase level  Weekly Progress Updates:  Pt demonstrates good progress this week, as she met 3/3 STGs. This reporting period, she presents w/ increased initiation of verbal responses (Y/N and open ended responses), increased spontaneous speech, and increased response to auditory, tactile, and verbal stimuli. She continues to demonstrate comprehension of conversational  tasks via appropriate laughter, smiling, and verbal responses. Success still varies throughout the day and day to day, however, she currently requires maxA overall for communication at this time. Pt/family education ongoing. She would benefit from continued ST to target increased consistency w/ responses, maximize pt independence, and reduce caregiver burden.    Intensity: Minumum of 1-2 x/day, 30 to 90 minutes Frequency: 1 to 3 out of 7 days Duration/Length of Stay: awaiting SNF placement Treatment/Interventions: Cognitive remediation/compensation;Internal/external aids;Speech/Language facilitation;Cueing hierarchy;Environmental controls;Therapeutic Activities;Functional tasks;Multimodal communication approach;Patient/family education;Therapeutic Exercise   Daily Session  Skilled Therapeutic Interventions: Pt greeted at bedside. She was asleep upon SLP arrival and required modA tactile/verbal cues to wake up. Fatigue negatively impacted success throughout tx session, as pt required maxA verbal/tactile cues to remain awake throughout conversational tasks. She responded to Y/N questions via head nod or verbalization ~30% of the time, though frequently required repetitions and additional time for responses d/t fatigue. Single word responses to open ended questions noted x2. Additionally, similar to prev tx session, she presented w/ limited smiling and general responsiveness to conversation. Tx tasks terminated d/t pt fatigue. She was left in bed with the alarm set and call light within reach. Recommend cont ST per POC.     Pain  No pain reported.   Therapy/Group: Individual Therapy  [name] 01/26/2023, 8:01 AM

## 2023-01-27 MED ORDER — TIZANIDINE HCL 4 MG PO TABS
4.0000 mg | ORAL_TABLET | Freq: Three times a day (TID) | ORAL | Status: DC
  Administered 2023-01-27 – 2023-01-28 (×2): 4 mg via ORAL
  Filled 2023-01-27 (×2): qty 1

## 2023-01-27 NOTE — Progress Notes (Signed)
PROGRESS NOTE   Subjective/Complaints: Patient is in positive mood smiling today, Nicole [name] is visiting. Discussed elevated blood pressures with Nicole [name], improved today, nutiritional supplements d/ced yesterday   ROS: Limited due to cognitive/behavioral   Objective:   No results found. No results for input(s): "WBC", "HGB", "HCT", "PLT" in the last 72 hours.    Recent Labs    01/26/23 1559  CREATININE 0.48        Intake/Output Summary (Last 24 hours) at 01/27/2023 1442 Last data filed at 01/27/2023 1248 Gross per 24 hour  Intake 1084 ml  Output --  Net 1084 ml      Pressure Injury 10/31/22 Buttocks Left Unstageable - Full thickness tissue loss in which the base of the injury is covered by slough (yellow, tan, gray, green or brown) and/or eschar (tan, brown or black) in the wound bed. (Active)  10/31/22 2000  Location: Buttocks  Location Orientation: Left  Staging: Unstageable - Full thickness tissue loss in which the base of the injury is covered by slough (yellow, tan, gray, green or brown) and/or eschar (tan, brown or black) in the wound bed.  Wound Description (Comments):   Present on Admission: Yes     Pressure Injury 11/18/22 Toe (Comment  which one) Anterior;Right Deep Tissue Pressure Injury - Purple or maroon localized area of discolored intact skin or blood-filled blister due to damage of underlying soft tissue from pressure and/or shear. small are (Active)  11/18/22 1300  Location: Toe (Comment  which one)  Location Orientation: Anterior;Right  Staging: Deep Tissue Pressure Injury - Purple or maroon localized area of discolored intact skin or blood-filled blister due to damage of underlying soft tissue from pressure and/or shear.  Wound Description (Comments): small area on right great toe metatarsal  Present on Admission: Yes    Physical Exam: Vital Signs Blood pressure 122/84, pulse 92, temperature  98.5 F (36.9 C), temperature source Oral, resp. rate 18, height 5\' 1"  (1.549 m), weight 45.6 kg, SpO2 100%.  Gen: no distress, normal appearing, BMI 18.99 HEENT: oral mucosa pink and moist, NCAT, impaired cervical range of motion, dyskinetic facial movements Cardio: HR normal rate and rhythm Chest: normal effort, normal rate of breathing Abd: soft, non-distended Ext: no edema Psych: Increased vocalizations, mostly nonsensical, but saying yes appropriately! Smiling appropriately  Skin: + Scaling dried skin on head present-2 small excoriations, sore on the base of the 5th metatarsal head, rash on face . stage 2 to right toe, area of redness between left thumb and index finger-covered in dressing, left buttock unstageable area not viewed  Neurologic: Minimal engagement on exam, tired.  Does not follow simple commands. Delayed processing Dyskinetic movements and twitching noted in face and head Tone:  MAS 3 right elbow, MAS 3 knee flexors, and MAS 3 L>R  hip adductors  MAS 3 L elbow , MS 3-4 in wrist and finger flexors  + LUE WHO  Motor 0/5 strength in LUE and BLE, some purposeful movement in RUE ; reflexively grasps hand Improved cervical extension  Withdraws to pain in bilateral lower and right upper extremities. Musculoskeletal: reduced bilateral shoulder and elbow ROM due to increased tone  Right wrist swelling -Ace  wrapped.   Assessment/Plan: 1. Functional deficits which require 3+ hours per day of interdisciplinary therapy in a comprehensive inpatient rehab setting. Physiatrist is providing close team supervision and 24 hour management of active medical problems listed below. Physiatrist and rehab team continue to assess barriers to discharge/monitor patient progress toward functional and medical goals  Care Tool:  Bathing    Body parts bathed by patient: Face, Abdomen, Chest   Body parts bathed by helper: Right arm, Left arm, Front perineal area, Buttocks, Right upper leg,  Left upper leg, Right lower leg, Left lower leg Body parts n/a: Right arm, Left arm, Front perineal area, Buttocks, Right upper leg, Left upper leg, Right lower leg, Left lower leg   Bathing assist Assist Level: Maximal Assistance - Patient 24 - 49%     Upper Body Dressing/Undressing Upper body dressing   What is the patient wearing?: Pull over shirt    Upper body assist Assist Level: Total Assistance - Patient < 25%    Lower Body Dressing/Undressing Lower body dressing      What is the patient wearing?: Pants, Incontinence brief     Lower body assist Assist for lower body dressing: Dependent - Patient 0%     Toileting Toileting Toileting Activity did not occur (Clothing management and hygiene only): N/A (no void or bm)  Toileting assist Assist for toileting: Total Assistance - Patient < 25%     Transfers Chair/bed transfer  Transfers assist  Chair/bed transfer activity did not occur: Safety/medical concerns  Chair/bed transfer assist level: Dependent - Patient 0%     Locomotion Ambulation   Ambulation assist   Ambulation activity did not occur: Safety/medical concerns  Assist level: 2 helpers   Max distance: 50ft   Walk 10 feet activity   Assist  Walk 10 feet activity did not occur: Safety/medical concerns  Assist level: 2 helpers     Walk 50 feet activity   Assist Walk 50 feet with 2 turns activity did not occur: Safety/medical concerns         Walk 150 feet activity   Assist Walk 150 feet activity did not occur: Safety/medical concerns         Walk 10 feet on uneven surface  activity   Assist Walk 10 feet on uneven surfaces activity did not occur: Safety/medical concerns         Wheelchair     Assist Is the patient using a wheelchair?: Yes Type of Wheelchair: Manual    Wheelchair assist level: Dependent - Patient 0% Max wheelchair distance: 41ft    Wheelchair 50 feet with 2 turns activity    Assist     Wheelchair 50 feet with 2 turns activity did not occur: Safety/medical concerns   Assist Level: Dependent - Patient 0%   Wheelchair 150 feet activity     Assist  Wheelchair 150 feet activity did not occur: Safety/medical concerns   Assist Level: Dependent - Patient 0%   Blood pressure 122/84, pulse 92, temperature 98.5 F (36.9 C), temperature source Oral, resp. rate 18, height 5\' 1"  (1.549 m), weight 45.6 kg, SpO2 100%.  Medical Problem List and Plan: 1. 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  Impaired mobility: continue Lovenox, weekly creatinine ordered             -antiplatelet therapy: N/A 3. Pain Management: Tylenol as needed  4. History of anxiety: discussed with her mom that she feels this was a big risk factor for her accident  5. Neuropsych/cognition: This patient is not capable of making decisions on her own behalf. 6. Left buttock unstageable ulcer.  continue Medihoney to buttock wound daily cover with foam dressing.  Changing dressing every 3 days or as needed soiling   7. Fluids/Electrolytes/Nutrition: Routine in and outs  with follow-up chemistries  -Eating 100% most meals with total assist  -9/8 she is eating most of her meals with assistance   8.  Cavitary right lower lobe pneumonia likely aspiration pneumonia/MRSA pneumonia.  Resolved    Latest Ref Rng & Units 01/22/2023    6:36 AM 12/20/2022   10:54 AM 12/11/2022    7:15 AM  CBC  WBC 4.0 - 10.5 K/uL 8.1  5.6  9.0   Hemoglobin 12.0 - 15.0 g/dL 52.8  41.3  24.4   Hematocrit 36.0 - 46.0 % 47.5  42.8  45.1   Platelets 150 - 400 K/uL 274  253  329      9.  History of drug abuse.  Positive cocaine on urine drug screen.  Provide counseling  10.  AKI/hypovolemia and ATN.  resolved    Latest Ref Rng & Units 01/26/2023    3:59 PM 01/22/2023    6:36 AM 01/19/2023    6:37 AM  BMP  Glucose 70 - 99 mg/dL  90    BUN 6 - 20 mg/dL  14    Creatinine 0.10 - 1.00 mg/dL 2.72  5.36  6.44   Sodium 135 - 145 mmol/L  139    Potassium 3.5 - 5.1 mmol/L  3.9    Chloride 98 - 111 mmol/L  104    CO2 22 - 32 mmol/L  26    Calcium 8.9 - 10.3 mg/dL  9.1      11.  Mild transaminitis with rhabdomyolysis.  Both resolved  12.  Hypotension. Resolved, d/c midodrine  13. Impaired initiation: continue amantadine 100mg  BID  14. Suboptimal vitamin D: continue 1,000U D3 daily  15. Spasticity:   -discussed serial casting with OT and her mom  - continue baclofen to 20mg  TID and tizanidine to 4mg  TID. Continue wrist, elbow brace, and PRAFOs. Ordered a night splint for LLE  Repeat CMP today given chronic tizanidine use  -9/7 continues to have significant tone despite use of tizanidine and baclofen, may be beneficial consider baclofen pump in outpatient setting if this causes significant difficulties in care  9/8 consider decrease baclofen if dyskinetic movements continue  16. Bowel and bladder incontinence: continue bowel and bladder program   17. Left hand skin maceration: Eucerin ordered, discussed hand split with OT, conitnue  18. History of magnesium deficiency: magnesium 500mg   started  HS    01/27/2023    1:08 PM 01/27/2023    5:40 AM 01/26/2023    9:39 PM  Vitals with BMI  Systolic 122 124 409  Diastolic 84 78 85  Pulse 92 80 84  Recheck tomorrow  19. HTN: Increase tizanidine to 6mg  TID. Magnesium supplement added HS. Increase propanolol to 10mg  TID, Magnesium reviewed and stable. Appears to be stable and elevate when nutritional supplements are added, d/ced supplements since patient is eating meals well and in case they are contributing to gastric distress that she cannot express or insulin resistance/HTN     01/27/2023    1:08 PM 01/27/2023    5:40 AM 01/26/2023    9:39 PM  Vitals with BMI  Systolic 122 124 811  Diastolic 84 78 85  Pulse 92 80 84     20. Area of redness between left thumb and index finger: continue foam dressing  21. Scalp eczema: selsun blue ordered, continue  -9/7 patient's Nicole [name] feels like this is causing her to itch, will start some hydrocortisone cream  22. Fatigue: improved, discussed with team may be secondary to anti-spasticity medications, B12 reviewed and is 335, in suboptimal range, will give 1,000U B12 injection 8/7, will order monthly as needed, metanx started, increase to BID, continue  23. Right wrist swelling: XR ordered and shows no acute fracture, shows 2.60mm ulnar variance, brace removed to given patient a break from it. Voltaren gel ordered prn for discomfort, continue, CT ordered and show no acute osseus injury, continue ace wrap  23. Dysphagia: continue D3/thins, d/c ensure as blood pressure has severely elevated since starting  24. MRSA nares positive: negative on repeat, precautions d/ced  25. Tachycardic: increase propanolol to BID.  Continue Tizanidine increased to 6mg  TID. Resolved, amantadine increased back to 100mg  BID, continue this dose  26.  Dyskinetic movements in face and head Appear to have resolved, EEG reviewed and was negative for seizure.   27. Small sore on the base of 5th metatarsal head:  local wound care, continue  28. Rash on face: resolved, discussed with Nicole [name] could have been secondary to added sugars in nutritional supplements  29. Sacral pressure injury: air mattress ordered, Placed nursing order to offload q1 hour while awake  30. Cervical extensor weakness: will decrease tizanidine to 4mg  TID   LOS: 70 days A FACE TO FACE EVALUATION WAS PERFORMED  Clint Bolder P Zaidin Blyden 01/27/2023, 2:42 PM

## 2023-01-28 MED ORDER — TIZANIDINE HCL 4 MG PO TABS
6.0000 mg | ORAL_TABLET | Freq: Three times a day (TID) | ORAL | Status: DC
  Administered 2023-01-28 – 2023-03-14 (×135): 6 mg via ORAL
  Filled 2023-01-28 (×135): qty 2

## 2023-01-28 NOTE — Progress Notes (Signed)
PROGRESS NOTE   Subjective/Complaints: Itching chest- sarna lotion ordered As per [facility] nurse tech said "good morning!" Smiling appropriately   ROS: Limited due to cognitive/behavioral   Objective:   No results found. No results for input(s): "WBC", "HGB", "HCT", "PLT" in the last 72 hours.    Recent Labs    01/26/23 1559  CREATININE 0.48        Intake/Output Summary (Last 24 hours) at 01/28/2023 1132 Last data filed at 01/28/2023 0825 Gross per 24 hour  Intake 1080 ml  Output --  Net 1080 ml      Pressure Injury 10/31/22 Buttocks Left Unstageable - Full thickness tissue loss in which the base of the injury is covered by slough (yellow, tan, gray, green or brown) and/or eschar (tan, brown or black) in the wound bed. (Active)  10/31/22 2000  Location: Buttocks  Location Orientation: Left  Staging: Unstageable - Full thickness tissue loss in which the base of the injury is covered by slough (yellow, tan, gray, green or brown) and/or eschar (tan, brown or black) in the wound bed.  Wound Description (Comments):   Present on Admission: Yes     Pressure Injury 11/18/22 Toe (Comment  which one) Anterior;Right Deep Tissue Pressure Injury - Purple or maroon localized area of discolored intact skin or blood-filled blister due to damage of underlying soft tissue from pressure and/or shear. small are (Active)  11/18/22 1300  Location: Toe (Comment  which one)  Location Orientation: Anterior;Right  Staging: Deep Tissue Pressure Injury - Purple or maroon localized area of discolored intact skin or blood-filled blister due to damage of underlying soft tissue from pressure and/or shear.  Wound Description (Comments): small area on right great toe metatarsal  Present on Admission: Yes    Physical Exam: Vital Signs Blood pressure 137/89, pulse 88, temperature 98.9 F (37.2 C), temperature source Oral, resp. rate 16, height  5\' 1"  (1.549 m), weight 48 kg, SpO2 100%.  Gen: no distress, normal appearing, BMI 19.99 HEENT: oral mucosa pink and moist, NCAT, impaired cervical range of motion, dyskinetic facial movements Cardio: HR normal rate and rhythm Chest: normal effort, normal rate of breathing Abd: soft, non-distended Ext: no edema Psych: Increased vocalizations, mostly nonsensical, but saying yes appropriately! Smiling appropriately, said good morning!  Skin: + Scaling dried skin on head present-2 small excoriations, sore on the base of the 5th metatarsal head, rash on face . stage 2 to right toe, area of redness between left thumb and index finger-covered in dressing, left buttock unstageable area not viewed  Neurologic: Minimal engagement on exam, tired.  Does not follow simple commands. Delayed processing Dyskinetic movements and twitching noted in face and head Tone:  MAS 3 right elbow, MAS 3 knee flexors, and MAS 3 L>R  hip adductors  MAS 3 L elbow , MS 3-4 in wrist and finger flexors  + LUE WHO  Motor 0/5 strength in LUE and BLE, some purposeful movement in RUE ; reflexively grasps hand Improved cervical extension  Withdraws to pain in bilateral lower and right upper extremities. Musculoskeletal: reduced bilateral shoulder and elbow ROM due to increased tone  Right wrist swelling -Ace wrapped.   Assessment/Plan:  1. Functional deficits which require 3+ hours per day of interdisciplinary therapy in a comprehensive inpatient rehab setting. Physiatrist is providing close team supervision and 24 hour management of active medical problems listed below. Physiatrist and rehab team continue to assess barriers to discharge/monitor patient progress toward functional and medical goals  Care Tool:  Bathing    Body parts bathed by patient: Face, Abdomen, Chest   Body parts bathed by helper: Right arm, Left arm, Front perineal area, Buttocks, Right upper leg, Left upper leg, Right lower leg, Left lower  leg Body parts n/a: Right arm, Left arm, Front perineal area, Buttocks, Right upper leg, Left upper leg, Right lower leg, Left lower leg   Bathing assist Assist Level: Maximal Assistance - Patient 24 - 49%     Upper Body Dressing/Undressing Upper body dressing   What is the patient wearing?: Pull over shirt    Upper body assist Assist Level: Total Assistance - Patient < 25%    Lower Body Dressing/Undressing Lower body dressing      What is the patient wearing?: Pants, Incontinence brief     Lower body assist Assist for lower body dressing: Dependent - Patient 0%     Toileting Toileting Toileting Activity did not occur (Clothing management and hygiene only): N/A (no void or bm)  Toileting assist Assist for toileting: Total Assistance - Patient < 25%     Transfers Chair/bed transfer  Transfers assist  Chair/bed transfer activity did not occur: Safety/medical concerns  Chair/bed transfer assist level: Dependent - Patient 0%     Locomotion Ambulation   Ambulation assist   Ambulation activity did not occur: Safety/medical concerns  Assist level: 2 helpers   Max distance: 21ft   Walk 10 feet activity   Assist  Walk 10 feet activity did not occur: Safety/medical concerns  Assist level: 2 helpers     Walk 50 feet activity   Assist Walk 50 feet with 2 turns activity did not occur: Safety/medical concerns         Walk 150 feet activity   Assist Walk 150 feet activity did not occur: Safety/medical concerns         Walk 10 feet on uneven surface  activity   Assist Walk 10 feet on uneven surfaces activity did not occur: Safety/medical concerns         Wheelchair     Assist Is the patient using a wheelchair?: Yes Type of Wheelchair: Manual    Wheelchair assist level: Dependent - Patient 0% Max wheelchair distance: 54ft    Wheelchair 50 feet with 2 turns activity    Assist    Wheelchair 50 feet with 2 turns activity did not  occur: Safety/medical concerns   Assist Level: Dependent - Patient 0%   Wheelchair 150 feet activity     Assist  Wheelchair 150 feet activity did not occur: Safety/medical concerns   Assist Level: Dependent - Patient 0%   Blood pressure 137/89, pulse 88, temperature 98.9 F (37.2 C), temperature source Oral, resp. rate 16, height 5\' 1"  (1.549 m), weight 48 kg, SpO2 100%.  Medical Problem List and Plan: 1. 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  Impaired mobility: continue Lovenox, weekly creatinine ordered             -antiplatelet therapy: N/A 3. Pain Management: Tylenol as needed  4. History of anxiety: discussed with her mom that she feels this was a big risk factor for her accident  5. Neuropsych/cognition: This patient is not capable of making decisions on her own behalf. 6. Left buttock unstageable ulcer.  continue Medihoney to buttock wound daily cover with foam dressing.  Changing dressing every 3 days or as needed soiling   7. Fluids/Electrolytes/Nutrition: Routine in and outs with follow-up chemistries  -Eating 100% most meals  with total assist  -9/8 she is eating most of her meals with assistance   8.  Cavitary right lower lobe pneumonia likely aspiration pneumonia/MRSA pneumonia.  Resolved    Latest Ref Rng & Units 01/22/2023    6:36 AM 12/20/2022   10:54 AM 12/11/2022    7:15 AM  CBC  WBC 4.0 - 10.5 K/uL 8.1  5.6  9.0   Hemoglobin 12.0 - 15.0 g/dL 09.8  11.9  14.7   Hematocrit 36.0 - 46.0 % 47.5  42.8  45.1   Platelets 150 - 400 K/uL 274  253  329      9.  History of drug abuse.  Positive cocaine on urine drug screen.  Provide counseling  10.  AKI/hypovolemia and ATN.  resolved    Latest Ref Rng & Units 01/26/2023    3:59 PM 01/22/2023    6:36 AM 01/19/2023    6:37 AM  BMP  Glucose 70 - 99 mg/dL  90    BUN 6 - 20 mg/dL  14    Creatinine 8.29 - 1.00 mg/dL 5.62  1.30  8.65   Sodium 135 - 145 mmol/L  139    Potassium 3.5 - 5.1 mmol/L  3.9    Chloride 98 - 111 mmol/L  104    CO2 22 - 32 mmol/L  26    Calcium 8.9 - 10.3 mg/dL  9.1      11.  Mild transaminitis with rhabdomyolysis.  Both resolved  12.  Hypotension. Resolved, d/c midodrine  13. Impaired initiation: continue amantadine 100mg  BID  14. Suboptimal vitamin D: continue 1,000U D3 daily  15. Spasticity:   -discussed serial casting with OT and her mom  - continue baclofen to 20mg  TID and tizanidine to 4mg  TID. Continue wrist, elbow brace, and PRAFOs. Ordered a night splint for LLE  Repeat CMP today given chronic tizanidine use  -9/7 continues to have significant tone despite use of tizanidine and baclofen, may be beneficial consider baclofen pump in outpatient setting if this causes significant difficulties in care  9/8 consider decrease baclofen if dyskinetic movements continue  16. Bowel and bladder incontinence: continue bowel and bladder program   17. Left hand skin maceration: Eucerin ordered, discussed hand split with OT, conitnue  18. History of magnesium deficiency: magnesium 500mg  started HS  01/28/2023    8:54 AM 01/28/2023     5:00 AM 01/27/2023    7:31 PM  Vitals with BMI  Systolic 137 153 829  Diastolic 89 93 91  Pulse 88 87 96  Recheck tomorrow  19. HTN: Increase tizanidine to 6mg  TID. Magnesium supplement added HS. Increase propanolol to 10mg  TID, Magnesium reviewed and stable. Appears to be stable and elevate when nutritional supplements are added, d/ced supplements since patient is eating meals well and in case they are contributing to gastric distress that she cannot express or insulin resistance/HTN     01/28/2023    8:54 AM 01/28/2023    5:00 AM 01/27/2023    7:31 PM  Vitals with BMI  Systolic 137 153 562  Diastolic 89 93 91  Pulse 88 87 96     20. Area of redness between left thumb and index finger: continue foam dressing  21. Scalp eczema: selsun blue ordered, continue  -9/7 patient's mother feels like this is causing her to itch, will start some hydrocortisone cream  22. Fatigue: improved, discussed with team may be secondary to anti-spasticity medications, B12 reviewed and is 335, in suboptimal range, will give 1,000U B12 injection 8/7, will order monthly as needed, metanx started, increase to BID, continue  23. Right wrist swelling: XR ordered and shows no acute fracture, shows 2.6mm ulnar variance, brace removed to given patient a break from it. Voltaren gel ordered prn for discomfort, continue, CT ordered and show no acute osseus injury, continue ace wrap  23. Dysphagia: continue D3/thins, d/c ensure as blood pressure severely elevates whenever restarted  24. MRSA nares positive: negative on repeat, precautions d/ced  25. Tachycardic: resolved, increase propanolol to BID.  continue Tizanidine increased to 6mg  TID. Resolved, amantadine increased back to 100mg  BID, continue this dose  26.  Dyskinetic movements in face and head Appear to have resolved, EEG reviewed and was negative for seizure.   27. Small sore on the base of 5th metatarsal head: local wound care, continue  28. Rash on  face: resolved, discussed with mother could have been secondary to added sugars in nutritional supplements  29. Sacral pressure injury: air mattress ordered, Placed nursing order to offload q1 hour while awake, continue medihoney  30. Cervical extensor weakness: tried decreasing tizanidine but spasticity appears increased and BP elevated so will increase back to 6mg  TID   LOS: 71 days A FACE TO FACE EVALUATION WAS PERFORMED  Clint Bolder P Marisel Tostenson 01/28/2023, 11:32 AM

## 2023-01-29 ENCOUNTER — Encounter (HOSPITAL_COMMUNITY): Payer: Self-pay | Admitting: Physical Medicine and Rehabilitation

## 2023-01-29 DIAGNOSIS — I10 Essential (primary) hypertension: Secondary | ICD-10-CM | POA: Insufficient documentation

## 2023-01-29 DIAGNOSIS — L8990 Pressure ulcer of unspecified site, unspecified stage: Secondary | ICD-10-CM | POA: Insufficient documentation

## 2023-01-29 HISTORY — DX: Essential (primary) hypertension: I10

## 2023-01-29 NOTE — Progress Notes (Signed)
Patient slept off and on throughout the shift- seems to fixate on rubbing head/scratching face. Sarna lotion applied with some relief noted. Elbow splints and bilateral boots worn this shift with no issues noted.

## 2023-01-29 NOTE — Progress Notes (Signed)
Physical Therapy Session Note  Patient Details  Name: Nicole [name] MRN: [id number] Date of Birth: 06-Sep-1975  Today's Date: 01/29/2023 PT Individual Time: 1300-1326 PT Individual Time Calculation (min): 26 min   Short Term Goals: Week 9: PT Short Term Goal 1 (Week 9): Pt will initiate bed mobility with totalA PT Short Term Goal 2 (Week 9): Pt will maintain unsupported sitting balance for 30 seconds without LOB PT Short Term Goal 3 (Week 9): Pt will complete bed<>chair transfers with no more than totalA  Skilled Therapeutic Interventions/Progress Updates:      Pt sitting in TIS w/c with her mom feeding her a cookie - mother reports that patient is speaking more today and she did during my session. She also was able to spontaneously lift her RLE during session which she hasn't shown before.   Transported to day room rehab gym for time. Completed squat<>pivot transfer with dependent assist into Nustep. SetupA for BLE extremities and setup at L1 resistance. Completed x8 minutes of mostly passive AROM for BLE on nustep, encouraging active assist as able but minimal ability and also impacting by her apraxia and cognition.   Returned to her TIS w/c and stretched her BLE into extension as tolerated, appears to have painful ROM in her L knee joint with passive stretching into extension and flexion, but has more tone development in her RLE.   Pt returned to her room and left with her mother at the bedside. All needs met.    Therapy Documentation Precautions:  Precautions Precautions: Fall Precaution Comments: Contact precautions & delayed processing Required Braces or Orthoses: Other Brace Other Brace: B adjustable night splints; B elbow "cosey" splints; L palm protector; R mitt Restrictions Weight Bearing Restrictions: No General:     Therapy/Group: Individual Therapy  Orrin Brigham 01/29/2023, 7:38 AM

## 2023-01-29 NOTE — Progress Notes (Signed)
Occupational Therapy Session Note  Patient Details  Name: Nicole [name] MRN: [id number] Date of Birth: 10/29/1975  Today's Date: 01/29/2023 OT Individual Time: 1191-4782 OT Individual Time Calculation (min): 30 min    Short Term Goals: Week 10:  OT Short Term Goal 1 (Week 1): Patient will reach forward to grasp familiar item needed for ADL task with min assist OT Short Term Goal 1 - Progress (Week 1): Not met OT Short Term Goal 2 (Week 1): Patient will indicate preference or need 3/5 times in session by verbal response, or head nod/shake OT Short Term Goal 2 - Progress (Week 1): Not met OT Short Term Goal 3 (Week 1): Patient will demonstrate 75% digit extension in left hand in preparation for left hand grasp/release OT Short Term Goal 3 - Progress (Week 1): Not met  Skilled Therapeutic Interventions/Progress Updates:    Patient received supine in bed - awake and alert.  Returned greeting of "good morning" Patient agreeable to get up ut of bed.  Brief wet, so worked on rolling left/right to clena patient, who then had BM.  Patient cleaned with total assist and new brief and pants applied.  Patient then assisted to sitting position with total assist.  Patient stated "Careful, you are crushing my fingers."  When this therapist had her hand over right hand to maintain sitting balance.  Encouraged patient to continue to make needs/ thoughts known.  Patient answering some biographical questions today.  E.g. 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 Patient able to reach forward to place right arm into sleeve today!   Left up in chair with alarm engaged and call bell in lap.    Therapy Documentation Precautions:  Precautions Precautions: Fall Precaution Comments: Contact precautions & delayed processing Required Braces or Orthoses: Other Brace Other Brace: B adjustable night splints; B elbow "cosey" splints;  L palm protector; R mitt Restrictions Weight Bearing Restrictions: No General:   Vital Signs:   Pain: Pain Assessment Pain Scale: 0-10 Pain Score: 0-No pain ADL: ADL Eating: Maximal assistance Where Assessed-Eating: Chair Grooming: Dependent Where Assessed-Grooming: Bed level Upper Body Bathing: Dependent Where Assessed-Upper Body Bathing: Bed level Lower Body Bathing: Dependent Where Assessed-Lower Body Bathing: Bed level Upper Body Dressing: Dependent Where Assessed-Upper Body Dressing: Bed level Lower Body Dressing: Dependent Where Assessed-Lower Body Dressing: Bed level Toileting: Dependent Where Assessed-Toileting: Bed level Toilet Transfer: Unable to assess Psychologist, counselling Transfer: Unable to assess Vision   Perception    Praxis   Balance   Exercises:   Other Treatments:     Therapy/Group: Individual Therapy  [name] 01/29/2023, 12:39 PM

## 2023-01-29 NOTE — Progress Notes (Signed)
PROGRESS NOTE   Subjective/Complaints:   said "good morning!" Pt eating lunch with NT assist    ROS: Limited due to cognitive/behavioral   Objective:   No results found. No results for input(s): "WBC", "HGB", "HCT", "PLT" in the last 72 hours.    Recent Labs    01/26/23 1559  CREATININE 0.48        Intake/Output Summary (Last 24 hours) at 01/29/2023 1157 Last data filed at 01/29/2023 0815 Gross per 24 hour  Intake 960 ml  Output --  Net 960 ml      Pressure Injury 10/31/22 Buttocks Left Unstageable - Full thickness tissue loss in which the base of the injury is covered by slough (yellow, tan, gray, green or brown) and/or eschar (tan, brown or black) in the wound bed. (Active)  10/31/22 2000  Location: Buttocks  Location Orientation: Left  Staging: Unstageable - Full thickness tissue loss in which the base of the injury is covered by slough (yellow, tan, gray, green or brown) and/or eschar (tan, brown or black) in the wound bed.  Wound Description (Comments):   Present on Admission: Yes     Pressure Injury 11/18/22 Toe (Comment  which one) Anterior;Right Deep Tissue Pressure Injury - Purple or maroon localized area of discolored intact skin or blood-filled blister due to damage of underlying soft tissue from pressure and/or shear. small are (Active)  11/18/22 1300  Location: Toe (Comment  which one)  Location Orientation: Anterior;Right  Staging: Deep Tissue Pressure Injury - Purple or maroon localized area of discolored intact skin or blood-filled blister due to damage of underlying soft tissue from pressure and/or shear.  Wound Description (Comments): small area on right great toe metatarsal  Present on Admission: Yes    Physical Exam: Vital Signs Blood pressure (!) 144/96, pulse 87, temperature 98.7 F (37.1 C), resp. rate 16, height 5\' 1"  (1.549 m), weight 48 kg, SpO2 92%.   General: No acute  distress Mood and affect are appropriate Heart: Regular rate and rhythm no rubs murmurs or extra sounds Lungs: Clear to auscultation, breathing unlabored, no rales or wheezes Abdomen: Positive bowel sounds, soft nontender to palpation, nondistended Extremities: No clubbing, cyanosis, or edema Skin: No evidence of breakdown, no evidence of rash   Skin: + Scaling dried skin on head present-2 small excoriations, sore on the base of the 5th metatarsal head, rash on face . stage 2 to right toe, area of redness between left thumb and index finger-covered in dressing, left buttock unstageable area not viewed  Neurologic: Minimal engagement on exam, tired. Answers "good morning"  Does not follow simple commands. Delayed processing  Tone:  MAS 3 right elbow,MAS 1 in RIght finger and wrist flexors  MAS 3 knee flexors, and MAS 3 L>R  hip adductors  MAS 3 L elbow , MAS 3-4 in L wrist and finger flexors  + LUE WHO  Motor 0/5 strength in LUE and BLE, some purposeful movement in RUE ; reflexively grasps hand Improved cervical extension  Withdraws to pain in bilateral lower and right upper extremities. Musculoskeletal: reduced bilateral shoulder and elbow ROM due to increased tone  Right wrist swelling -Ace wrapped.   Assessment/Plan:  1. Functional deficits which require 3+ hours per day of interdisciplinary therapy in a comprehensive inpatient rehab setting. Physiatrist is providing close team supervision and 24 hour management of active medical problems listed below. Physiatrist and rehab team continue to assess barriers to discharge/monitor patient progress toward functional and medical goals  Care Tool:  Bathing    Body parts bathed by patient: Face, Abdomen, Chest   Body parts bathed by helper: Right arm, Left arm, Front perineal area, Buttocks, Right upper leg, Left upper leg, Right lower leg, Left lower leg Body parts n/a: Right arm, Left arm, Front perineal area, Buttocks, Right upper  leg, Left upper leg, Right lower leg, Left lower leg   Bathing assist Assist Level: Maximal Assistance - Patient 24 - 49%     Upper Body Dressing/Undressing Upper body dressing   What is the patient wearing?: Pull over shirt    Upper body assist Assist Level: Total Assistance - Patient < 25%    Lower Body Dressing/Undressing Lower body dressing      What is the patient wearing?: Pants, Incontinence brief     Lower body assist Assist for lower body dressing: Dependent - Patient 0%     Toileting Toileting Toileting Activity did not occur (Clothing management and hygiene only): N/A (no void or bm)  Toileting assist Assist for toileting: Total Assistance - Patient < 25%     Transfers Chair/bed transfer  Transfers assist  Chair/bed transfer activity did not occur: Safety/medical concerns  Chair/bed transfer assist level: Dependent - Patient 0%     Locomotion Ambulation   Ambulation assist   Ambulation activity did not occur: Safety/medical concerns  Assist level: 2 helpers   Max distance: 83ft   Walk 10 feet activity   Assist  Walk 10 feet activity did not occur: Safety/medical concerns  Assist level: 2 helpers     Walk 50 feet activity   Assist Walk 50 feet with 2 turns activity did not occur: Safety/medical concerns         Walk 150 feet activity   Assist Walk 150 feet activity did not occur: Safety/medical concerns         Walk 10 feet on uneven surface  activity   Assist Walk 10 feet on uneven surfaces activity did not occur: Safety/medical concerns         Wheelchair     Assist Is the patient using a wheelchair?: Yes Type of Wheelchair: Manual    Wheelchair assist level: Dependent - Patient 0% Max wheelchair distance: 65ft    Wheelchair 50 feet with 2 turns activity    Assist    Wheelchair 50 feet with 2 turns activity did not occur: Safety/medical concerns   Assist Level: Dependent - Patient 0%   Wheelchair  150 feet activity     Assist  Wheelchair 150 feet activity did not occur: Safety/medical concerns   Assist Level: Dependent - Patient 0%   Blood pressure (!) 144/96, pulse 87, temperature 98.7 F (37.1 C), resp. rate 16, height 5\' 1"  (1.549 m), weight 48 kg, SpO2 92%.  Medical Problem List and Plan: 1. 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  Impaired mobility: continue Lovenox, weekly creatinine ordered             -antiplatelet therapy: N/A 3. Pain Management: Tylenol as needed  4. History of anxiety: discussed with her mom that she feels this was a big risk factor for her accident  5. Neuropsych/cognition: This patient is not capable of making decisions on her own behalf. 6. Left buttock unstageable ulcer.  continue Medihoney to buttock wound daily cover with foam dressing.  Changing dressing every 3 days or as needed soiling   7. Fluids/Electrolytes/Nutrition: Routine in and outs with follow-up chemistries  -Eating 100% most meals with total assist  -9/8 she is eating most of her meals with assistance   8.  Cavitary right lower lobe pneumonia likely aspiration pneumonia/MRSA pneumonia.  Resolved    Latest Ref  Rng & Units 01/22/2023    6:36 AM 12/20/2022   10:54 AM 12/11/2022    7:15 AM  CBC  WBC 4.0 - 10.5 K/uL 8.1  5.6  9.0   Hemoglobin 12.0 - 15.0 g/dL 87.5  64.3  32.9   Hematocrit 36.0 - 46.0 % 47.5  42.8  45.1   Platelets 150 - 400 K/uL 274  253  329      9.  History of drug abuse.  Positive cocaine on urine drug screen.  Provide counseling  10.  AKI/hypovolemia and ATN.  resolved    Latest Ref Rng & Units 01/26/2023    3:59 PM 01/22/2023    6:36 AM 01/19/2023    6:37 AM  BMP  Glucose 70 - 99 mg/dL  90    BUN 6 - 20 mg/dL  14    Creatinine 5.18 - 1.00 mg/dL 8.41  6.60  6.30   Sodium 135 - 145 mmol/L  139    Potassium 3.5 - 5.1 mmol/L  3.9    Chloride 98 - 111 mmol/L  104    CO2 22 - 32 mmol/L  26    Calcium 8.9 - 10.3 mg/dL  9.1      11.  Mild transaminitis with rhabdomyolysis.  Both resolved  12.  Hypotension. Resolved, d/c midodrine  13. Impaired initiation: continue amantadine 100mg  BID  14. Suboptimal vitamin D: continue 1,000U D3 daily  15. Spasticity:   -discussed serial casting with OT and her mom  - continue baclofen to 20mg  TID and tizanidine to 4mg  TID. Continue wrist, elbow brace, and PRAFOs. Ordered a night splint for LLE  Repeat CMP today given chronic tizanidine use  -9/7 continues to have significant tone despite use of tizanidine and baclofen, may be beneficial consider baclofen pump in outpatient setting if this causes significant difficulties in care  9/8 consider decrease baclofen if dyskinetic movements continue  16. Bowel and bladder incontinence: continue bowel and bladder program   17. Left hand skin maceration: Eucerin ordered, discussed hand split with OT, conitnue  18. History of magnesium deficiency: magnesium 500mg  started HS    01/29/2023    5:04 AM 01/28/2023  7:36 PM 01/28/2023    1:51 PM  Vitals with BMI  Systolic 144 130 99  Diastolic 96 95 70  Pulse 87 88 81    19. HTN: see above Increase tizanidine to 6mg  TID. Magnesium supplement  added HS. Increase propanolol to 10mg  TID,Mild elevation monitor as there has been some variablity May need antihypertensive med or change tizanidine to clonidine     01/29/2023    5:04 AM 01/28/2023    7:36 PM 01/28/2023    1:51 PM  Vitals with BMI  Systolic 144 130 99  Diastolic 96 95 70  Pulse 87 88 81     20. Area of redness between left thumb and index finger: continue foam dressing  21. Scalp eczema: selsun blue ordered, continue  -9/7 patient's mother feels like this is causing her to itch, will start some hydrocortisone cream  22. Fatigue: improved, discussed with team may be secondary to anti-spasticity medications, B12 reviewed and is 335, in suboptimal range, will give 1,000U B12 injection 8/7, will order monthly as needed, metanx started, increase to BID, continue  23. Right wrist swelling: XR ordered and shows no acute fracture, shows 2.51mm ulnar variance, brace removed to given patient a break from it. Voltaren gel ordered prn for discomfort, continue, CT ordered and show no acute osseus injury, continue ace wrap  23. Dysphagia: continue D3/thins, d/c ensure as blood pressure severely elevates whenever restarted  24. MRSA nares positive: negative on repeat, precautions d/ced  25. Tachycardic: resolved, increase propanolol to BID.  continue Tizanidine increased to 6mg  TID. Resolved, amantadine increased back to 100mg  BID, continue this dose  26.  Dyskinetic movements in face and head Appear to have resolved, EEG reviewed and was negative for seizure.   27. Small sore on the base of 5th metatarsal head: local wound care, continue  28. Rash on face: resolved, discussed with mother could have been secondary to added sugars in nutritional supplements  29. Sacral pressure injury: air mattress ordered, Placed nursing order to offload q1 hour while awake, continue medihoney  30. Cervical extensor weakness: tried decreasing tizanidine but spasticity appears increased and BP  elevated so will increase back to 6mg  TID   LOS: 72 days A FACE TO FACE EVALUATION WAS PERFORMED  Erick Colace 01/29/2023, 11:57 AM

## 2023-01-30 MED ORDER — PROSOURCE PLUS PO LIQD
30.0000 mL | Freq: Two times a day (BID) | ORAL | Status: DC
  Administered 2023-01-30 – 2023-03-20 (×85): 30 mL via ORAL
  Filled 2023-01-30 (×68): qty 30

## 2023-01-30 NOTE — Progress Notes (Signed)
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   ROS: Limited due to cognitive/behavioral   Objective:   No results found. No results for input(s): "WBC", "HGB", "HCT", "PLT" in the last 72 hours.    No results for input(s): "NA", "K", "CL", "CO2", "GLUCOSE", "BUN", "CREATININE", "CALCIUM" in the last 72 hours.       Intake/Output Summary (Last 24 hours) at 01/30/2023 1041 Last data filed at 01/30/2023 0700 Gross per 24 hour  Intake 810 ml  Output --  Net 810 ml      Pressure Injury 10/31/22 Buttocks Left Unstageable - Full thickness tissue loss in which the base of the injury is covered by slough (yellow, tan, gray, green or brown) and/or eschar (tan, brown or black) in the wound bed. (Active)  10/31/22 2000  Location: Buttocks  Location Orientation: Left  Staging: Unstageable - Full thickness tissue loss in which the base of the injury is covered by slough (yellow, tan, gray, green or brown) and/or eschar (tan, brown or black) in the wound bed.  Wound Description (Comments):   Present on Admission: Yes     Pressure Injury 11/18/22 Toe (Comment  which one) Anterior;Right Deep Tissue Pressure Injury - Purple or maroon localized area of discolored intact skin or blood-filled blister due to damage of underlying soft tissue from pressure and/or shear. small are (Active)  11/18/22 1300  Location: Toe (Comment  which one)  Location Orientation: Anterior;Right  Staging: Deep Tissue Pressure Injury - Purple or maroon localized area of discolored intact skin or blood-filled blister due to damage of underlying soft tissue from pressure and/or shear.  Wound Description (Comments): small area on right great toe metatarsal  Present on Admission: Yes    Physical  Exam: Vital Signs Blood pressure (!) 137/98, pulse 81, temperature 98.3 F (36.8 C), temperature source Oral, resp. rate 16, height 5\' 1"  (1.549 m), weight 48 kg, SpO2 100%.  Gen: no distress, normal appearing HEENT: oral mucosa pink and moist, NCAT Cardio: Reg rate Chest: normal effort, normal rate of breathing Abd: soft, non-distended Ext: no edema Psych: pleasant, normal affect Skin: + Scaling dried skin on head present-2 small excoriations, sore on the base of the 5th metatarsal head, rash on face . stage 2 to right toe, area of redness between left thumb and index finger-covered in dressing, left buttock unstageable area not viewed  Neurologic: Minimal engagement on exam, tired. Answers "good morning"  Does not follow simple commands. Delayed processing  Tone:  MAS 3 right elbow,MAS 1 in RIght finger and wrist flexors  MAS 3 knee flexors, and MAS 3 L>R  hip adductors  MAS 3 L elbow , MAS 3-4 in L wrist and finger flexors  + LUE WHO  Motor 0/5 strength in LUE and BLE, some purposeful movement in RUE ; reflexively grasps hand Improved cervical extension  Withdraws to pain in bilateral lower and right upper extremities. Musculoskeletal: reduced bilateral shoulder and elbow ROM due to increased tone  Right wrist swelling -Ace wrapped.   Assessment/Plan: 1.  Functional deficits which require 3+ hours per day of interdisciplinary therapy in a comprehensive inpatient rehab setting. Physiatrist is providing close team supervision and 24 hour management of active medical problems listed below. Physiatrist and rehab team continue to assess barriers to discharge/monitor patient progress toward functional and medical goals  Care Tool:  Bathing    Body parts bathed by patient: Face, Abdomen, Chest   Body parts bathed by helper: Right arm, Left arm, Front perineal area, Buttocks, Right upper leg, Left upper leg, Right lower leg, Left lower leg Body parts n/a: Right arm, Left arm,  Front perineal area, Buttocks, Right upper leg, Left upper leg, Right lower leg, Left lower leg   Bathing assist Assist Level: Maximal Assistance - Patient 24 - 49%     Upper Body Dressing/Undressing Upper body dressing   What is the patient wearing?: Pull over shirt    Upper body assist Assist Level: Total Assistance - Patient < 25%    Lower Body Dressing/Undressing Lower body dressing      What is the patient wearing?: Pants, Incontinence brief     Lower body assist Assist for lower body dressing: Dependent - Patient 0%     Toileting Toileting Toileting Activity did not occur (Clothing management and hygiene only): N/A (no void or bm)  Toileting assist Assist for toileting: Total Assistance - Patient < 25%     Transfers Chair/bed transfer  Transfers assist  Chair/bed transfer activity did not occur: Safety/medical concerns  Chair/bed transfer assist level: Dependent - Patient 0%     Locomotion Ambulation   Ambulation assist   Ambulation activity did not occur: Safety/medical concerns  Assist level: 2 helpers   Max distance: 20ft   Walk 10 feet activity   Assist  Walk 10 feet activity did not occur: Safety/medical concerns  Assist level: 2 helpers     Walk 50 feet activity   Assist Walk 50 feet with 2 turns activity did not occur: Safety/medical concerns         Walk 150 feet activity   Assist Walk 150 feet activity did not occur: Safety/medical concerns         Walk 10 feet on uneven surface  activity   Assist Walk 10 feet on uneven surfaces activity did not occur: Safety/medical concerns         Wheelchair     Assist Is the patient using a wheelchair?: Yes Type of Wheelchair: Manual    Wheelchair assist level: Dependent - Patient 0% Max wheelchair distance: 31ft    Wheelchair 50 feet with 2 turns activity    Assist    Wheelchair 50 feet with 2 turns activity did not occur: Safety/medical concerns   Assist  Level: Dependent - Patient 0%   Wheelchair 150 feet activity     Assist  Wheelchair 150 feet activity did not occur: Safety/medical concerns   Assist Level: Dependent - Patient 0%   Blood pressure (!) 137/98, pulse 81, temperature 98.3 F (36.8 C), temperature source Oral, resp. rate 16, height 5\' 1"  (1.549 m), weight 48 kg, SpO2 100%.  Medical Problem List and Plan: 1. 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  Impaired mobility: continue Lovenox, weekly creatinine ordered             -antiplatelet therapy: N/A 3. Pain Management: Tylenol as needed  4. History of anxiety: discussed with her mom that she feels this was a big risk factor for her accident  5. Neuropsych/cognition: This patient is not capable of making decisions on her own behalf. 6. Left buttock unstageable ulcer.  continue Medihoney to buttock wound daily cover with foam dressing.  Changing dressing every 3 days or as needed soiling   7. Fluids/Electrolytes/Nutrition: Routine in and outs with follow-up chemistries  -Eating 100% most meals with assist 8.  Cavitary right lower lobe pneumonia likely aspiration pneumonia/MRSA pneumonia.  Resolved     Latest Ref Rng & Units 01/22/2023    6:36 AM 12/20/2022   10:54 AM 12/11/2022    7:15 AM  CBC  WBC 4.0 - 10.5 K/uL 8.1  5.6  9.0   Hemoglobin 12.0 - 15.0 g/dL 90.2  40.9  73.5   Hematocrit 36.0 - 46.0 % 47.5  42.8  45.1   Platelets 150 - 400 K/uL 274  253  329      9.  History of drug abuse.  Positive cocaine on urine drug screen.  Provide counseling  10.  AKI/hypovolemia and ATN.  resolved    Latest Ref Rng & Units 01/26/2023    3:59 PM 01/22/2023    6:36 AM 01/19/2023    6:37 AM  BMP  Glucose 70 - 99 mg/dL  90    BUN 6 - 20 mg/dL  14    Creatinine 3.29 - 1.00 mg/dL 9.24  2.68  3.41   Sodium 135 - 145 mmol/L  139    Potassium 3.5 - 5.1 mmol/L  3.9    Chloride 98 - 111 mmol/L  104    CO2 22 - 32 mmol/L  26    Calcium 8.9 - 10.3 mg/dL  9.1      11.  Mild transaminitis with rhabdomyolysis.  Both resolved  12.  Hypotension. Resolved, d/c midodrine  13. Impaired initiation: continue amantadine 100mg  BID  14. Suboptimal vitamin D: continue 1,000U D3 daily  15. Spasticity:   -discussed serial casting with OT and her mom  - continue baclofen to 20mg  TID and tizanidine to 4mg  TID. Continue wrist, elbow brace, and PRAFOs. Ordered a night splint for LLE  Repeat CMP today given chronic tizanidine use  -9/7 continues to have significant tone despite use of tizanidine and baclofen, may be beneficial consider baclofen pump in outpatient setting if this causes significant difficulties in care  9/8 consider decrease baclofen if dyskinetic movements continue  16. Bowel and bladder incontinence: continue bowel and bladder program   17. Left hand skin maceration: Eucerin ordered, discussed hand split with OT, conitnue  18. History of magnesium deficiency: magnesium 500mg  started HS    01/30/2023    8:28 AM 01/29/2023    8:19 PM 01/29/2023    3:09 PM  Vitals with  BMI  Systolic 137 118 161  Diastolic 98 68 86  Pulse 81 100 80    19. HTN: see above Increase tizanidine to 6mg  TID. Magnesium  supplement added HS. Increase propanolol to 10mg  TID,Mild elevation monitor as there has been some variablity May need antihypertensive med or change tizanidine to clonidine     01/30/2023    8:28 AM 01/29/2023    8:19 PM 01/29/2023    3:09 PM  Vitals with BMI  Systolic 137 118 096  Diastolic 98 68 86  Pulse 81 100 80     20. Area of redness between left thumb and index finger: continue foam dressing  21. Scalp eczema: selsun blue ordered, continue  -9/7 patient's mother feels like this is causing her to itch, will start some hydrocortisone cream  22. Fatigue: improved, discussed with team may be secondary to anti-spasticity medications, B12 reviewed and is 335, in suboptimal range, will give 1,000U B12 injection 8/7, will order monthly as needed, metanx started, increase to BID, continue  23. Right wrist swelling: XR ordered and shows no acute fracture, shows 2.14mm ulnar variance, brace removed to given patient a break from it. Voltaren gel ordered prn for discomfort, continue, CT ordered and show no acute osseus injury, continue ace wrap  23. Dysphagia: continue D3/thins, d/c ensure as blood pressure severely elevates whenever restarted  24. MRSA nares positive: negative on repeat, precautions d/ced  25. Tachycardic: resolved, increase propanolol to BID.  continue Tizanidine increased to 6mg  TID. Resolved, amantadine increased back to 100mg  BID, continue this dose  26.  Dyskinetic movements in face and head Appear to have resolved, EEG reviewed and was negative for seizure.   27. Small sore on the base of 5th metatarsal head: local wound care, continue  28. Rash on face: resolved, discussed with mother could have been secondary to added sugars in nutritional supplements  29. Sacral pressure injury: air mattress ordered, Placed nursing order to offload q1 hour while awake, continue medihoney  30. Cervical extensor weakness: tried decreasing tizanidine but spasticity appears  increased and BP elevated so will increase back to 6mg  TID, continue this dose  31. Low protein stores: prosource ordered   LOS: 73 days A FACE TO FACE EVALUATION WAS PERFORMED  Clint Bolder P Johney Perotti 01/30/2023, 10:41 AM

## 2023-01-30 NOTE — Progress Notes (Signed)
Patient ID: Nicole [name], female   DOB: December 30, 1975, 47 y.o.   MRN: [id number] Spoke with [name] worker who reported her disability has been approved now will need to get long term medicaid approved. [name] will work on this but also reminded worker she has more days left to approve it.have contacted Mom to let her know and also contacted [name] at [facility] to inform of this. Will continue to work on getting this approved so can transfer if [facility] has not filled the bed.

## 2023-01-30 NOTE — Progress Notes (Signed)
Occupational Therapy Session Note  Patient Details  Name: LULABELLE DANCER MRN: [id number] Date of Birth: [birth year]  Today's Date: 01/30/2023 OT Individual Time: 5732-2025 OT Individual Time Calculation (min): 43 min    Short Term Goals: Week 9: OT Short Term Goal 1 (Week 9): Patient will change orientation of toothbrush/ hairbrush to accomodate task with min assist after set up OT Short Term Goal 2 (Week 9): Patient will spear food x 3 once utensil in hand, then bring to mouth with only contact assistance  Skilled Therapeutic Interventions/Progress Updates:  Pt greeted supine in bed, pt agreeable to OT intervention. Pt making more efforts to verbalize this session, stating "yes"consistently and appropriately. Pt completed all transfers with total A as well as ADLS. pt completed bathing with mostly total A but did assist with washing her hair with RUE and washing her chest.   Pt completed dressing with overall total A but did initiate putting her RUE into sleeve of shirt, pt also able to brush her hair with RUE.         Ended session with pt seated in w/c with all needs within reach and safety belt alarm activated.                    Therapy Documentation Precautions:  Precautions Precautions: Fall Precaution Comments: Contact precautions & delayed processing Required Braces or Orthoses: Other Brace Other Brace: B adjustable night splints; B elbow "cosey" splints; L palm protector; R mitt Restrictions Weight Bearing Restrictions: No  Pain: pt grimaces to pain, repositioning provided as needed.     Therapy/Group: Individual Therapy  [name] 01/30/2023, 10:32 AM

## 2023-01-30 NOTE — Progress Notes (Signed)
Nutrition Follow-up  DOCUMENTATION CODES:   Severe malnutrition in context of acute illness/injury  INTERVENTION:  Provide PROSource Plus 30 mL po BID, each supplement provides 100 kcal and 15 grams of protein.  Provide double protein portions with meals.  Continue multivitamin with minerals po daily.  Continue vitamin C 500 mg daily to promote wound healing.  NUTRITION DIAGNOSIS:   Severe Malnutrition related to acute illness as evidenced by moderate fat depletion, moderate muscle depletion.  Ongoing - addressing with interventions.  GOAL:   Patient will meet greater than or equal to 90% of their needs  Pt meeting estimated kcal needs but not meeting estimated protein needs at this time.  MONITOR:   PO intake, Supplement acceptance  REASON FOR ASSESSMENT:   Malnutrition Screening Tool    ASSESSMENT:   47 y.o. 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 PMH includes: anxiety, asthma, depression.   Pt sleeping in chair at time of RD assessment. Per review of chart pt starting to slowly speak more and spoke two sentences with OT yesterday. Pt documented to be eating 100% of meals per chart. Discussed with NT and RN. Pt able to feed herself breakfast this morning. She will finish everything on tray. Wound is stable, but not healing very quickly. RD calculated intake in the previous 24 hrs. Patient has had approximately 1685 kcal (100% estimated needs), but only 66 grams of protein (83% minimum estimated needs). Pt would benefit from additional protein to meet estimated needs and promote wound healing.  Pt was 47.4 kg on 7/20. Wts fluctuate slightly in chart. Most recent wt 48 kg on 01/27/23. Recommend continuing to trend weights.  Medications reviewed and include: vitamin C 500 mg daily, vitamin D3 1000 international units daily, magnesium gluconate 500 mg daily, MVI daily,  propranolol  Labs reviewed.  UOP: 3 occurrences unmeasured UOP in previous 24 hrs  Discussed with MD via secure chat. There was concern that previous oral nutrition supplements (Ensure, Molli Posey, [place]) were correlated with hypertension for pt. Discussed that pt not meeting protein needs to support wound healing with meals alone. Okay to try PROSource supplements to help meet protein needs.  Diet Order:   Diet Order             DIET DYS 3 Room service appropriate? Yes; Fluid consistency: Thin  Diet effective now                  EDUCATION NEEDS:   Not appropriate for education at this time  Skin:  Skin Assessment: Skin Integrity Issues: Skin Integrity Issues:: Unstageable, Other (Comment), DTI DTI: right toe Unstageable: left buttocks Other: non pressure wound to toe  Last BM:  01/27/23 - large type 6  Height:   Ht Readings from Last 1 Encounters:  11/18/22 5\' 1"  (1.549 m)   Weight:   Wt Readings from Last 1 Encounters:  01/27/23 48 kg   BMI:  Body mass index is 19.99 kg/m.  Estimated Nutritional Needs:   Kcal:  1600-1800  Protein:  80-100 gm  Fluid:  >/= 1.6 L  Forever Arechiga Tollie Eth, MS, RD, LDN, CNSC Pager number available on Amion

## 2023-01-30 NOTE — Progress Notes (Signed)
Speech Language Pathology Daily Session Note  Patient Details  Name: Nicole [name] MRN: [id number] Date of Birth: Sep 30, 1975  Today's Date: 01/30/2023 SLP Individual Time: 6578-4696 SLP Individual Time Calculation (min): 40 min  Short Term Goals: Week 9: SLP Short Term Goal 1 (Week 9): Week 10 - Pt will present w/ appropriate response to functional stimuli (i.e. accept spoon, grasp basic item appropriately, response to tactile/auditory stimuli) 40% of the time w/ maxA multimodal cues SLP Short Term Goal 2 (Week 9): Week 10 - Pt will answer open ended questions w/ modA in 50% of opportunities SLP Short Term Goal 3 (Week 9): Week 10 - Pt will answer basic yes/no questions with modA in 50% of opportunities SLP Short Term Goal 4 (Week 9): Week 10 - Pt will utilize increased vocal intensity w/ maxA verbal cues to remain 70% intelligibile at phrase level  Skilled Therapeutic Interventions: SLP conducted skilled therapy session targeting communication and cognitive goals. SLP facilitated communication tasks including yes/no questions, open ended questions, and requesting increased vocal intensity. Given max-totalA, patient increased vocal intensity slightly to obtain 60% intelligibility at the phrase level. Sentence level intelligibility remained <30% despite cues. Patient answered ~40% of yes/no questions given modA throughout session with questionable accuracy. Patient responded to open-ended questions in ~30% of opportunities. During functional tasks, patient required maxA to utilize chapstick and modA to utilize [employer]. Patient was left in lowered bed with call bell in reach and bed alarm set. SLP will continue to target goals per plan of care.       Pain Pain Assessment Pain Scale: Faces Faces Pain Scale: No hurt  Therapy/Group: Individual Therapy  [name], M.A., CCC-SLP  [name] 01/30/2023, 3:38 PM

## 2023-01-30 NOTE — Progress Notes (Signed)
Physical Therapy Session Note  Patient Details  Name: Nicole [name] MRN: [id number] Date of Birth: 05-Apr-1976  Today's Date: 01/30/2023 PT Individual Time: 1120-1159 PT Individual Time Calculation (min): 39 min   Short Term Goals: Week 9: PT Short Term Goal 1 (Week 9): Pt will initiate bed mobility with totalA PT Short Term Goal 2 (Week 9): Pt will maintain unsupported sitting balance for 30 seconds without LOB PT Short Term Goal 3 (Week 9): Pt will complete bed<>chair transfers with no more than totalA  Skilled Therapeutic Interventions/Progress Updates:      Pt reclined in TIS w/c to start - awake and agreeable to therapy. No signs of resting pain but does grimace more when stretched into extension, especially for her LLE.   Transported to day room rehab gym. +2 dependent lift transfer onto tilt table. Pt strapped onto table and positioned to promote weight bearing through BLE for stretching and NMR. Tilt table up to 75deg and tolerated for >10 minutes. Worked on cervical PROM to promote extension, rotation, and lateral flexion to the L. Also worked on [employer] bilaterally but again, patient grimacing with this more than before, especially on LLE.   Pt noted to be soiled. Returned to [employer] w/c with +2 assist and then back to her room onto the bed for brief change and pericare. Incontinent x2 and charted in flowsheets. Pt concluded session positioned in bed with bed flat, call bell in lap.   Therapy Documentation Precautions:  Precautions Precautions: Fall Precaution Comments: Contact precautions & delayed processing Required Braces or Orthoses: Other Brace Other Brace: B adjustable night splints; B elbow "cosey" splints; L palm protector; R mitt Restrictions Weight Bearing Restrictions: No General:       Therapy/Group: Individual Therapy  Orrin Brigham 01/30/2023, 7:41 AM

## 2023-01-31 NOTE — Progress Notes (Signed)
Physical Therapy Session Note  Patient Details  Name: Nicole [name] MRN: [id number] Date of Birth: 11-May-1976  Today's Date: 01/31/2023 PT Individual Time: 1130-1200 PT Individual Time Calculation (min): 30 min   Short Term Goals: Week 9: PT Short Term Goal 1 (Week 9): Pt will initiate bed mobility with totalA PT Short Term Goal 2 (Week 9): Pt will maintain unsupported sitting balance for 30 seconds without LOB PT Short Term Goal 3 (Week 9): Pt will complete bed<>chair transfers with no more than totalA  Skilled Therapeutic Interventions/Progress Updates:      Pt in recliner to start - transported to day room rehab gym. Completed squat pivot transfer with totalA onto mat table. She was positioned in supine and then rolled into prone with +2 assist for positioning and comfort. Patient able to lie in prone position for >15 minutes, using a towel roll under her forehead to promote extension and provide support. Worked on PROM for B quads to promote stretching hip flexors. Pt returned back to her TIS w/c and was fully reclined for pressure relief. Left reclined in TIS w/c in her room with all needs met.  Therapy Documentation Precautions:  Precautions Precautions: Fall Precaution Comments: Contact precautions & delayed processing Required Braces or Orthoses: Other Brace Other Brace: B adjustable night splints; B elbow "cosey" splints; L palm protector; R mitt Restrictions Weight Bearing Restrictions: No General:      Therapy/Group: Individual Therapy  Orrin Brigham 01/31/2023, 7:57 AM

## 2023-01-31 NOTE — Progress Notes (Signed)
Speech Language Pathology Daily Session Note  Patient Details  Name: Nicole [name] MRN: [id number] Date of Birth: 12/10/1975  Today's Date: 01/31/2023 SLP Individual Time: 1400-1430 SLP Individual Time Calculation (min): 30 min  Short Term Goals: Week 9: SLP Short Term Goal 1: Week 10 - Pt will present w/ appropriate response to functional stimuli (i.e. accept spoon, grasp basic item appropriately, response to tactile/auditory stimuli) 40% of the time w/ maxA multimodal cues SLP Short Term Goal 2: Week 10 - Pt will answer open ended questions w/ modA in 50% of opportunities SLP Short Term Goal 3: Week 10 - Pt will answer basic yes/no questions with modA in 50% of opportunities SLP Short Term Goal 4  Week 10 - Pt will utilize increased vocal intensity w/ maxA verbal cues to remain 70% intelligibile at phrase level  Skilled Therapeutic Interventions: Skilled treatment session focused on communication goals. Upon arrival, patient was awake while supine in bed. Patient agreeable to treatment session but declined getting out of bed. SLP facilitated session with a basic reading task at the word level to elicit verbal responses. Patient initially required Max A multimodal cues to attend to task, however, patient was incontinent of urine and bowel which SLP suspects was distracting patient. Total A needed for peri care. By end of session, patient able to read aloud consistently at the word level. Throughout session, patient with intermittent spontaneous verbalizations at the phrase level with decreased intelligibility due to a low vocal intensity and imprecise consonants resulting in multiple repetitions needed. Patient left supine in bed with alarm on and all needs within reach. Continue with current plan of care.      Pain Intermittent pain with repositioning   Therapy/Group: Individual Therapy  Meril Dray 01/31/2023, 2:41 PM

## 2023-01-31 NOTE — Consult Note (Signed)
WOC Nurse Consult Note: Reason for Consult:Stage 3 pressure injury to left buttocks. Previously unstageable.  Markedly improved.  Photo in chart Wound type:pressure injury Pressure Injury POA: Yes Measurement:  Open area is 0.5 cm x 0.3 cm x 0.2 cm surrounding tissue has healed and is pink but intact Wound bed: red and moist Drainage (amount, consistency, odor) minimal serosanguinous  no odor.   Periwound: Patient is in brief during therapy and is incontinent of bowel and bladder.  She is soiled on my assessment and the tech and I clean and redress her.  She will have another therapy session in 30 mins. She is pleasant.  Answers questions with a quiet "yes" or "no" .   Dressing procedure/placement/frequency: Cleanse wound to left buttock and pat dry. Apply silicone foam dressing and change every other day and PRN soilage.  Foam dressings on both heels.  Heels are intact  Patient is on mattress with low air loss feature.  Mittens are used to prevent scratching. Will not follow at this time.  Please re-consult if needed.  Mike Gip MSN, RN, FNP-BC CWON Wound, Ostomy, Continence Nurse [facility] [phone] Pager [phone]

## 2023-01-31 NOTE — Progress Notes (Signed)
Occupational Therapy Session Note  Patient Details  Name: Nicole [name] MRN: [id number] Date of Birth: [birth year]  Today's Date: 01/31/2023 OT Individual Time: 0730-0800 OT Individual Time Calculation (min): 30 min    Short Term Goals: Week 1:  OT Short Term Goal 1 (Week 1): Patient will reach forward to grasp familiar item needed for ADL task with min assist OT Short Term Goal 1 - Progress (Week 1): Not met OT Short Term Goal 2 (Week 1): Patient will indicate preference or need 3/5 times in session by verbal response, or head nod/shake OT Short Term Goal 2 - Progress (Week 1): Not met OT Short Term Goal 3 (Week 1): Patient will demonstrate 75% digit extension in left hand in preparation for left hand grasp/release OT Short Term Goal 3 - Progress (Week 1): Not met  Skilled Therapeutic Interventions/Progress Updates:    Patient received supine in bed - awake and alert and greeted therapist - "What's up?"   Patient agreeable to getting up and dressed - declined a shower.  Patient chose shirt verbally from choice of three.  Patient in clean , dry brief.  Total assist to dress lower body.  Max assist to dress upper body, patient assisting with reaching right arm into sleeve.  Provided gentle stretch to left hand and placed resting hand splint to promote slightly increased extension in digits.  Patient expressing discomfort initially - but had her count to 10 - then patient tolerating splint.  Left up in wheelchair with safety belt in place and call bell in lap. Informed NT patient ready for breakfast.    Therapy Documentation Precautions:  Precautions Precautions: Fall Precaution Comments: Contact precautions & delayed processing Required Braces or Orthoses: Other Brace Other Brace: B adjustable night splints; B elbow "cosey" splints; L palm protector; R mitt Restrictions Weight Bearing Restrictions: No  Pain:  No pain indicated at start of session.   "Thank you for being gentle."  With  slow range of motion     Therapy/Group: Individual Therapy  [name] 01/31/2023, 8:59 AM

## 2023-01-31 NOTE — Progress Notes (Signed)
PROGRESS NOTE   Subjective/Complaints: No new complaints this morning Improving vocalizations Team conference today Was able to assist her feeding herself yesterday  ROS: Limited due to cognitive/behavioral   Objective:   No results found. No results for input(s): "WBC", "HGB", "HCT", "PLT" in the last 72 hours.    No results for input(s): "NA", "K", "CL", "CO2", "GLUCOSE", "BUN", "CREATININE", "CALCIUM" in the last 72 hours.       Intake/Output Summary (Last 24 hours) at 01/31/2023 1103 Last data filed at 01/31/2023 0700 Gross per 24 hour  Intake 1620 ml  Output --  Net 1620 ml      Pressure Injury 10/31/22 Buttocks Left Unstageable - Full thickness tissue loss in which the base of the injury is covered by slough (yellow, tan, gray, green or brown) and/or eschar (tan, brown or black) in the wound bed. (Active)  10/31/22 2000  Location: Buttocks  Location Orientation: Left  Staging: Unstageable - Full thickness tissue loss in which the base of the injury is covered by slough (yellow, tan, gray, green or brown) and/or eschar (tan, brown or black) in the wound bed.  Wound Description (Comments):   Present on Admission: Yes     Pressure Injury 11/18/22 Toe (Comment  which one) Anterior;Right Deep Tissue Pressure Injury - Purple or maroon localized area of discolored intact skin or blood-filled blister due to damage of underlying soft tissue from pressure and/or shear. small are (Active)  11/18/22 1300  Location: Toe (Comment  which one)  Location Orientation: Anterior;Right  Staging: Deep Tissue Pressure Injury - Purple or maroon localized area of discolored intact skin or blood-filled blister due to damage of underlying soft tissue from pressure and/or shear.  Wound Description (Comments): small area on right great toe metatarsal  Present on Admission: Yes    Physical Exam: Vital Signs Blood pressure (!) 130/97,  pulse (!) 102, temperature 98.1 F (36.7 C), temperature source Oral, resp. rate 16, height 5\' 1"  (1.549 m), weight 48 kg, SpO2 100%.  Gen: no distress, normal appearing HEENT: oral mucosa pink and moist, NCAT Cardio: Tachyarcia Chest: normal effort, normal rate of breathing Abd: soft, non-distended Ext: no edema Psych: pleasant, normal affect Skin: + Scaling dried skin on head present-2 small excoriations, sore on the base of the 5th metatarsal head, rash on face . stage 2 to right toe, area of redness between left thumb and index finger-covered in dressing, left buttock unstageable area not viewed  Neurologic: Minimal engagement on exam, tired. Answers "good morning"  Does not follow simple commands. Delayed processing  Tone:  MAS 3 right elbow,MAS 1 in RIght finger and wrist flexors  MAS 3 knee flexors, and MAS 3 L>R  hip adductors  MAS 3 L elbow , MAS 3-4 in L wrist and finger flexors  + LUE WHO  Motor 0/5 strength in LUE and BLE, some purposeful movement in RUE ; reflexively grasps hand Improved cervical extension  Withdraws to pain in bilateral lower and right upper extremities. Musculoskeletal: reduced bilateral shoulder and elbow ROM due to increased tone  Right wrist swelling -Ace wrapped.   Assessment/Plan: 1. Functional deficits which require 3+ hours per day of interdisciplinary  therapy in a comprehensive inpatient rehab setting. Physiatrist is providing close team supervision and 24 hour management of active medical problems listed below. Physiatrist and rehab team continue to assess barriers to discharge/monitor patient progress toward functional and medical goals  Care Tool:  Bathing    Body parts bathed by patient: Face, Abdomen, Chest   Body parts bathed by helper: Right arm, Left arm, Front perineal area, Buttocks, Right upper leg, Left upper leg, Right lower leg, Left lower leg Body parts n/a: Right arm, Left arm, Front perineal area, Buttocks, Right upper  leg, Left upper leg, Right lower leg, Left lower leg   Bathing assist Assist Level: Maximal Assistance - Patient 24 - 49%     Upper Body Dressing/Undressing Upper body dressing   What is the patient wearing?: Pull over shirt    Upper body assist Assist Level: Total Assistance - Patient < 25%    Lower Body Dressing/Undressing Lower body dressing      What is the patient wearing?: Pants, Incontinence brief     Lower body assist Assist for lower body dressing: Dependent - Patient 0%     Toileting Toileting Toileting Activity did not occur (Clothing management and hygiene only): N/A (no void or bm)  Toileting assist Assist for toileting: Total Assistance - Patient < 25%     Transfers Chair/bed transfer  Transfers assist  Chair/bed transfer activity did not occur: Safety/medical concerns  Chair/bed transfer assist level: Dependent - Patient 0%     Locomotion Ambulation   Ambulation assist   Ambulation activity did not occur: Safety/medical concerns  Assist level: 2 helpers   Max distance: 18ft   Walk 10 feet activity   Assist  Walk 10 feet activity did not occur: Safety/medical concerns  Assist level: 2 helpers     Walk 50 feet activity   Assist Walk 50 feet with 2 turns activity did not occur: Safety/medical concerns         Walk 150 feet activity   Assist Walk 150 feet activity did not occur: Safety/medical concerns         Walk 10 feet on uneven surface  activity   Assist Walk 10 feet on uneven surfaces activity did not occur: Safety/medical concerns         Wheelchair     Assist Is the patient using a wheelchair?: Yes Type of Wheelchair: Manual    Wheelchair assist level: Dependent - Patient 0% Max wheelchair distance: 76ft    Wheelchair 50 feet with 2 turns activity    Assist    Wheelchair 50 feet with 2 turns activity did not occur: Safety/medical concerns   Assist Level: Dependent - Patient 0%   Wheelchair  150 feet activity     Assist  Wheelchair 150 feet activity did not occur: Safety/medical concerns   Assist Level: Dependent - Patient 0%   Blood pressure (!) 130/97, pulse (!) 102, temperature 98.1 F (36.7 C), temperature source Oral, resp. rate 16, height 5\' 1"  (1.549 m), weight 48 kg, SpO2 100%.  Medical Problem List and Plan: 1. 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  Impaired mobility: continue Lovenox, weekly creatinine ordered             -antiplatelet therapy: N/A 3. Pain Management: Tylenol as needed  4. History of anxiety: discussed with her mom that she feels this was a big risk factor for her accident  5. Neuropsych/cognition: This patient is not capable of making decisions on her own behalf. 6. Left buttock unstageable ulcer.  continue Medihoney to buttock wound daily cover with foam dressing.  Changing dressing every 3 days or as needed soiling   7. Fluids/Electrolytes/Nutrition: Routine in and outs with follow-up chemistries  -Eating 100% most meals with assist 8.  Cavitary right lower lobe pneumonia likely aspiration pneumonia/MRSA pneumonia.  Resolved     Latest Ref Rng & Units 01/22/2023    6:36 AM 12/20/2022   10:54 AM 12/11/2022    7:15 AM  CBC  WBC 4.0 - 10.5 K/uL 8.1  5.6  9.0   Hemoglobin 12.0 - 15.0 g/dL 41.3  24.4  01.0   Hematocrit 36.0 - 46.0 % 47.5  42.8  45.1   Platelets 150 - 400 K/uL 274  253  329      9.  History of drug abuse.  Positive cocaine on urine drug screen.  Provide counseling  10.  AKI/hypovolemia and ATN.  resolved    Latest Ref Rng & Units 01/26/2023    3:59 PM 01/22/2023    6:36 AM 01/19/2023    6:37 AM  BMP  Glucose 70 - 99 mg/dL  90    BUN 6 - 20 mg/dL  14    Creatinine 2.72 - 1.00 mg/dL 5.36  6.44  0.34   Sodium 135 - 145 mmol/L  139    Potassium 3.5 - 5.1 mmol/L  3.9    Chloride 98 - 111 mmol/L  104    CO2 22 - 32 mmol/L  26    Calcium 8.9 - 10.3 mg/dL  9.1      11.  Mild transaminitis with rhabdomyolysis.  Both resolved  12.  Hypotension. Resolved, d/c midodrine  13. Impaired initiation: continue amantadine 100mg  BID  14. Suboptimal vitamin D: continue 1,000U D3 daily  15. Spasticity:   -discussed serial casting with OT and her mom  - continue baclofen to 20mg  TID and tizanidine to 4mg  TID. Continue wrist, elbow brace, and PRAFOs. Ordered a night splint for LLE  Repeat CMP today given chronic tizanidine use  -9/7 continues to have significant tone despite use of tizanidine and baclofen, may be beneficial consider baclofen pump in outpatient setting if this causes significant difficulties in care  9/8 consider decrease baclofen if dyskinetic movements continue  16. Bowel and bladder incontinence: continue bowel and bladder program   17. Left hand skin maceration: Eucerin ordered, discussed hand split with OT, conitnue  18. History of magnesium deficiency: magnesium 500mg  started HS    01/31/2023   10:11 AM 01/31/2023    6:28 AM 01/30/2023    7:38 PM  Vitals with BMI  Systolic 130 130 425  Diastolic 97 99 80  Pulse 102 89 100    19. HTN: see above Increase tizanidine to 6mg  TID.  Magnesium supplement added HS. Increase propanolol to 10mg  TID,Mild elevation monitor as there has been some variablity May need antihypertensive med or change tizanidine to clonidine     01/31/2023   10:11 AM 01/31/2023    6:28 AM 01/30/2023    7:38 PM  Vitals with BMI  Systolic 130 130 956  Diastolic 97 99 80  Pulse 102 89 100     20. Area of redness between left thumb and index finger: continue foam dressing  21. Scalp eczema: selsun blue ordered, continue  -9/7 patient's mother feels like this is causing her to itch, will start some hydrocortisone cream  22. Fatigue: improved, discussed with team may be secondary to anti-spasticity medications, B12 reviewed and is 335, in suboptimal range, will give 1,000U B12 injection 8/7, will order monthly as needed, metanx started, increase to BID, continue  23. Right wrist swelling: XR ordered and shows no acute fracture, shows 2.53mm ulnar variance, brace removed to given patient a break from it. Voltaren gel ordered prn for discomfort, continue, CT ordered and show no acute osseus injury, continue ace wrap  23. Dysphagia: continue D3/thins, d/c ensure as blood pressure severely elevates whenever restarted  24. MRSA nares positive: negative on repeat, precautions d/ced  25. Tachycardic: resolved, increase propanolol to BID.  continue Tizanidine increased to 6mg  TID. Resolved, amantadine increased back to 100mg  BID, continue this dose  26.  Dyskinetic movements in face and head Appear to have resolved, EEG reviewed and was negative for seizure.   27. Small sore on the base of 5th metatarsal head: local wound care, continue  28. Rash on face: resolved, discussed with mother could have been secondary to added sugars in nutritional supplements  29. Sacral pressure injury: air mattress ordered, Placed nursing order to offload q1 hour while awake, continue medihoney, wound care consulted  30. Cervical extensor weakness: tried decreasing  tizanidine but spasticity appears increased and BP elevated so will increase back to 6mg  TID, continue this dose  31. Low protein stores: prosource ordered   LOS: 74 days A FACE TO FACE EVALUATION WAS PERFORMED  Nicole [name] 01/31/2023, 11:03 AM

## 2023-02-01 NOTE — Progress Notes (Signed)
Speech Language Pathology Daily Session Note  Patient Details  Name: Nicole [name] MRN: [id number] Date of Birth: [birth year]  Today's Date: 02/01/2023 SLP Individual Time: 1320-1400 SLP Individual Time Calculation (min): 40 min  Short Term Goals: Week 9: SLP Short Term Goal 1 (Week 9): Week 10 - Pt will present w/ appropriate response to functional stimuli (i.e. accept spoon, grasp basic item appropriately, response to tactile/auditory stimuli) 40% of the time w/ maxA multimodal cues SLP Short Term Goal 2 (Week 9): Week 10 - Pt will answer open ended questions w/ modA in 50% of opportunities SLP Short Term Goal 3 (Week 9): Week 10 - Pt will answer basic yes/no questions with modA in 50% of opportunities SLP Short Term Goal 4 (Week 9): Week 10 - Pt will utilize increased vocal intensity w/ maxA verbal cues to remain 70% intelligibile at phrase level  Skilled Therapeutic Interventions: SLP conducted skilled therapy session targeting communication goals. Patient received upright in chair with nursing staff finishing lunch. RN reported completion of all food on meal tray with no overt difficulty. SLP facilitated communication tasks throughout session with patient participating well in beginning. Patient's alertness and wakeful state decreased throughout session with patient becoming somnolent towards the end, rousable with verbal and tactile cues. Patient responded to open ended questions 30% of the time given modA and responded to yes/no questions 50% of the time with modA. Patient minimally responsive to cues to increase vocal intensity this session. With picture cards, patient unresponsive to questions re: images pictures. During interactive coloring task, SLP attempted to facilitate agency by providing patient with choices between color options for SLP to color with limited response. Patient was left in chair with call bell in reach and chair alarm set. SLP will continue to target goals per plan of care.        Pain Pain Assessment Pain Scale: 0-10 Pain Score: 0-No pain  Therapy/Group: Individual Therapy  [name], M.A., CCC-SLP  [name] 02/01/2023, 4:25 PM

## 2023-02-01 NOTE — Patient Care Conference (Signed)
Inpatient RehabilitationTeam Conference and Plan of Care Update Date: 01/31/2023   Time: 11:02 AM    Patient Name: Nicole [name]      Medical Record Number: [id number]  Date of Birth: Mar 23, 1976 Sex: Female         Room/Bed: 4W09C/4W09C-01 Payor Info: Payor: [employer] / Plan: [employer] / Product Type: *No Product type* /    Admit Date/Time:  11/18/2022  3:15 PM  Primary Diagnosis:  Hypoxic brain injury [facility])  Hospital Problems: Principal Problem:   Hypoxic brain injury (HCC) Active Problems:   Protein-calorie malnutrition, severe   Essential hypertension   Pressure injury of skin    Expected Discharge Date: Expected Discharge Date:  (SNF pending)  Team Members Present: Physician leading conference: Dr. Sula Soda Social Worker Present: Dossie Der, LCSW Nurse Present: Chana Bode, RN PT Present: Wynelle Link, PT OT Present: Bretta Bang, OT SLP Present: [name], SLP PPS Coordinator present : Fae Pippin, SLP     Current Status/Progress Goal Weekly Team Focus  Bowel/Bladder   Pt is incontinent of bowel/bladder   Pt will gain continence of bowel/bladder   Will assess qshift and PRN    Swallow/Nutrition/ Hydration               ADL's   dependent for ADLs but assisting more with familar tasks such as washing her hair, brushing her hair, bathing and dressing.   max assist   postural control, ADL participation    Mobility   Total to dependent care for functional mobiltiy. Most improved in speech   TotA overall  Bed positioning, stretching into extension, posture and head control    Communication   Answers Y/N questions w/ modA however question accuracy. Answers very funcitonal open ended questions ~30% of the time. Demonstrates comprehension of simple conversation. Command following continues to be limited.   Answer functional open ended questions ~50% of the time   Open ended questions re familiar information,  environment, and facilitation of automatic responses. Following basic commands    Safety/Cognition/ Behavioral Observations  Increased consistency w/ alertness/responsiveness/interaction with environment; difficult to truly assess given severity of communication deficits   answer Y/N questions to ID orientation information w/ maxA   sustained attention, tolerance of auditory/visual stimuli, orientation information, functioanl tasks    Pain   Pt shows no sign of pain   Pt will remain pain free   Will assess qshift and PRN    Skin   Pt has pressure wound on left buttocks   [name] wound will heal  Will assess qshift and PRN      Discharge Planning:  Disability has been determinted as of 9/13 now waiting for long term care medicaid to be approved. Spoke with Medicaid worker who is currently working on this. [facility] may have filled the bed due to can not leave open for long periods of time   Team Discussion: Patient verbalizing more and is more alert, aware and engaged but continues to be dependent for care; able to feed self and brush her hair.  Patient on target to meet rehab goals: Currently able to attend to a simple conversation but note limited command following. Able to answer yes/no questions with mod assist and follows open ended functional cues.  *See Care Plan and progress notes for long and short-term goals.   Revisions to Treatment Plan:  Long term medicaid application   Teaching Needs: Safety, medications, transfers, toileting, skin/wound care, etc.   Current Barriers to  Discharge: Home enviroment access/layout, Incontinence, Wound care, and Lack of/limited family support  Possible Resolutions to Barriers: Disability application approved     Medical Summary Current Status: hypertension, impaired initiation, spasticity, pressure injury, psoriasis  Barriers to Discharge: Medical stability  Barriers to Discharge Comments: hypertension, impaired initiation,  spasticity, pressure injury, psoriasis Possible Resolutions to [employer] Focus: continue tizanidine, baclofen, amantadine, continue offloading/wound care consulted, continue selsun blue   Continued Need for Acute Rehabilitation Level of Care: The patient requires daily medical management by a physician with specialized training in physical medicine and rehabilitation for the following reasons: Direction of a multidisciplinary physical rehabilitation program to maximize functional independence : Yes Medical management of patient stability for increased activity during participation in an intensive rehabilitation regime.: Yes Analysis of laboratory values and/or radiology reports with any subsequent need for medication adjustment and/or medical intervention. : Yes   I attest that I was present, lead the team conference, and concur with the assessment and plan of the team.   Chana Bode B 02/01/2023, 11:56 AM

## 2023-02-01 NOTE — Progress Notes (Signed)
Physical Therapy Weekly Progress Note  Patient Details  Name: Nicole [name] MRN: [id number] Date of Birth: [birth year]  Beginning of progress report period: January 25, 2023 End of progress report period: February 01, 2023  Today's Date: 02/01/2023 PT Individual Time: 0900-0930 PT Individual Time Calculation (min): 30 min   Patient has met 0 of 3 short term goals.  Nicole [name] is making slow progress towards functional mobility goals however is making the most progress with cognition, awareness, and alertness. She continues to require dependent care for all aspects of mobility. Therapy is focusing mostly on positioning, posture, and progressing mobility as able.   Patient continues to demonstrate the following deficits muscle weakness and muscle joint tightness, decreased cardiorespiratoy endurance, abnormal tone, decreased visual perceptual skills and decreased visual motor skills, decreased motor planning, decreased initiation, decreased attention, decreased awareness, decreased problem solving, decreased safety awareness, decreased memory, and delayed processing, and decreased sitting balance, decreased postural control, and decreased balance strategies and therefore will continue to benefit from skilled PT intervention to increase functional independence with mobility.  Patient progressing toward long term goals..  Continue plan of care.  PT Short Term Goals Week 9: PT Short Term Goal 1 (Week 9): Pt will initiate bed mobility with totalA PT Short Term Goal 2 (Week 9): Pt will maintain unsupported sitting balance for 30 seconds without LOB PT Short Term Goal 3 (Week 9): Pt will complete bed<>chair transfers with no more than totalA  Skilled Therapeutic Interventions/Progress Updates:      Pt in bed with nursing giving morning medications. Pt without signs of pain. Noted to be incontinent of bowel while dressing her. Dependent care for pericare and brief change at bed level. Donned pants and  t-shirt at bed level with +2 assist. Supine<>sitting EOB dependent assist and squat pivot transfer into TIS w/c. Donned tennis shoes for skin protection against foot plates. Full lap tray applied, pillows under elbows, fully reclined for sacral skin protection. All needs met.     Therapy Documentation Precautions:  Precautions Precautions: Fall Precaution Comments: Contact precautions & delayed processing Required Braces or Orthoses: Other Brace Other Brace: B adjustable night splints; B elbow "cosey" splints; L palm protector; R mitt Restrictions Weight Bearing Restrictions: No General:      Therapy/Group: Individual Therapy  Orrin Brigham 02/01/2023, 7:48 AM

## 2023-02-01 NOTE — Progress Notes (Signed)
PROGRESS NOTE   Subjective/Complaints: About to work with PT Nursing notes that pressure injury has improved, medihoney is being applied, and WOC nurse saw patient  ROS: Limited due to cognitive/behavioral   Objective:   No results found. No results for input(s): "WBC", "HGB", "HCT", "PLT" in the last 72 hours.    No results for input(s): "NA", "K", "CL", "CO2", "GLUCOSE", "BUN", "CREATININE", "CALCIUM" in the last 72 hours.       Intake/Output Summary (Last 24 hours) at 02/01/2023 1134 Last data filed at 02/01/2023 0847 Gross per 24 hour  Intake 1136 ml  Output --  Net 1136 ml      Pressure Injury 10/31/22 Buttocks Left Unstageable - Full thickness tissue loss in which the base of the injury is covered by slough (yellow, tan, gray, green or brown) and/or eschar (tan, brown or black) in the wound bed. (Active)  10/31/22 2000  Location: Buttocks  Location Orientation: Left  Staging: Unstageable - Full thickness tissue loss in which the base of the injury is covered by slough (yellow, tan, gray, green or brown) and/or eschar (tan, brown or black) in the wound bed.  Wound Description (Comments):   Present on Admission: Yes     Pressure Injury 11/18/22 Toe (Comment  which one) Anterior;Right Deep Tissue Pressure Injury - Purple or maroon localized area of discolored intact skin or blood-filled blister due to damage of underlying soft tissue from pressure and/or shear. small are (Active)  11/18/22 1300  Location: Toe (Comment  which one)  Location Orientation: Anterior;Right  Staging: Deep Tissue Pressure Injury - Purple or maroon localized area of discolored intact skin or blood-filled blister due to damage of underlying soft tissue from pressure and/or shear.  Wound Description (Comments): small area on right great toe metatarsal  Present on Admission: Yes    Physical Exam: Vital Signs Blood pressure (!) 132/95,  pulse 91, temperature 98.6 F (37 C), temperature source Oral, resp. rate 16, height 5\' 1"  (1.549 m), weight 48 kg, SpO2 100%.  Gen: no distress, normal appearing HEENT: oral mucosa pink and moist, NCAT Cardio: Reg rate Chest: normal effort, normal rate of breathing Abd: soft, non-distended Ext: no edema Psych: pleasant, normal affect Skin: + Scaling dried skin on head present-2 small excoriations, sore on the base of the 5th metatarsal head, rash on face . stage 2 to right toe, area of redness between left thumb and index finger-covered in dressing, left buttock unstageable area not viewed  Neurologic: Minimal engagement on exam, tired. Answers "good morning"  Does not follow simple commands. Delayed processing  Tone:  MAS 3 right elbow,MAS 1 in RIght finger and wrist flexors  MAS 3 knee flexors, and MAS 3 L>R  hip adductors  MAS 3 L elbow , MAS 3-4 in L wrist and finger flexors  + LUE WHO  Motor 0/5 strength in LUE and BLE, some purposeful movement in RUE ; reflexively grasps hand Improved cervical extension  Withdraws to pain in bilateral lower and right upper extremities. Musculoskeletal: reduced bilateral shoulder and elbow ROM due to increased tone  Right wrist swelling -Ace wrapped.   Assessment/Plan: 1. Functional deficits which require 3+ hours per  day of interdisciplinary therapy in a comprehensive inpatient rehab setting. Physiatrist is providing close team supervision and 24 hour management of active medical problems listed below. Physiatrist and rehab team continue to assess barriers to discharge/monitor patient progress toward functional and medical goals  Care Tool:  Bathing    Body parts bathed by patient: Face, Abdomen, Chest   Body parts bathed by helper: Right arm, Left arm, Front perineal area, Buttocks, Right upper leg, Left upper leg, Right lower leg, Left lower leg Body parts n/a: Right arm, Left arm, Front perineal area, Buttocks, Right upper leg, Left  upper leg, Right lower leg, Left lower leg   Bathing assist Assist Level: Maximal Assistance - Patient 24 - 49%     Upper Body Dressing/Undressing Upper body dressing   What is the patient wearing?: Pull over shirt    Upper body assist Assist Level: Total Assistance - Patient < 25%    Lower Body Dressing/Undressing Lower body dressing      What is the patient wearing?: Pants, Incontinence brief     Lower body assist Assist for lower body dressing: Dependent - Patient 0%     Toileting Toileting Toileting Activity did not occur (Clothing management and hygiene only): N/A (no void or bm)  Toileting assist Assist for toileting: Total Assistance - Patient < 25%     Transfers Chair/bed transfer  Transfers assist  Chair/bed transfer activity did not occur: Safety/medical concerns  Chair/bed transfer assist level: Dependent - Patient 0%     Locomotion Ambulation   Ambulation assist   Ambulation activity did not occur: Safety/medical concerns  Assist level: 2 helpers   Max distance: 36ft   Walk 10 feet activity   Assist  Walk 10 feet activity did not occur: Safety/medical concerns  Assist level: 2 helpers     Walk 50 feet activity   Assist Walk 50 feet with 2 turns activity did not occur: Safety/medical concerns         Walk 150 feet activity   Assist Walk 150 feet activity did not occur: Safety/medical concerns         Walk 10 feet on uneven surface  activity   Assist Walk 10 feet on uneven surfaces activity did not occur: Safety/medical concerns         Wheelchair     Assist Is the patient using a wheelchair?: Yes Type of Wheelchair: Manual    Wheelchair assist level: Dependent - Patient 0% Max wheelchair distance: 45ft    Wheelchair 50 feet with 2 turns activity    Assist    Wheelchair 50 feet with 2 turns activity did not occur: Safety/medical concerns   Assist Level: Dependent - Patient 0%   Wheelchair 150 feet  activity     Assist  Wheelchair 150 feet activity did not occur: Safety/medical concerns   Assist Level: Dependent - Patient 0%   Blood pressure (!) 132/95, pulse 91, temperature 98.6 F (37 C), temperature source Oral, resp. rate 16, height 5\' 1"  (1.549 m), weight 48 kg, SpO2 100%.  Medical Problem List and Plan: 1. 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  Impaired mobility: continue Lovenox, weekly creatinine ordered             -antiplatelet therapy: N/A 3. Pain Management: Tylenol as needed  4. History of anxiety: discussed with her mom that she feels this was a big risk factor for her accident  5. Neuropsych/cognition: This patient is not capable of making decisions on her own behalf. 6. Left buttock unstageable ulcer.  continue Medihoney to buttock wound daily cover with foam dressing.  Changing dressing every 3 days or as needed soiling   7. Fluids/Electrolytes/Nutrition: Routine in and outs with follow-up chemistries  -Eating 100% most meals with assist 8.  Cavitary right lower lobe pneumonia likely aspiration pneumonia/MRSA pneumonia.  Resolved    Latest Ref Rng  & Units 01/22/2023    6:36 AM 12/20/2022   10:54 AM 12/11/2022    7:15 AM  CBC  WBC 4.0 - 10.5 K/uL 8.1  5.6  9.0   Hemoglobin 12.0 - 15.0 g/dL 13.0  86.5  78.4   Hematocrit 36.0 - 46.0 % 47.5  42.8  45.1   Platelets 150 - 400 K/uL 274  253  329      9.  History of drug abuse.  Positive cocaine on urine drug screen.  Provide counseling  10.  AKI/hypovolemia and ATN.  resolved    Latest Ref Rng & Units 01/26/2023    3:59 PM 01/22/2023    6:36 AM 01/19/2023    6:37 AM  BMP  Glucose 70 - 99 mg/dL  90    BUN 6 - 20 mg/dL  14    Creatinine 6.96 - 1.00 mg/dL 2.95  2.84  1.32   Sodium 135 - 145 mmol/L  139    Potassium 3.5 - 5.1 mmol/L  3.9    Chloride 98 - 111 mmol/L  104    CO2 22 - 32 mmol/L  26    Calcium 8.9 - 10.3 mg/dL  9.1      11.  Mild transaminitis with rhabdomyolysis.  Both resolved  12.  Hypotension. Resolved, d/c midodrine  13. Impaired initiation: continue amantadine 100mg  BID  14. Suboptimal vitamin D: continue 1,000U D3 daily  15. Spasticity:   -discussed serial casting with OT and her mom  - continue baclofen to 20mg  TID and tizanidine to 4mg  TID. Continue wrist, elbow brace, and PRAFOs. Ordered a night splint for LLE  Repeat CMP today given chronic tizanidine use  -9/7 continues to have significant tone despite use of tizanidine and baclofen, may be beneficial consider baclofen pump in outpatient setting if this causes significant difficulties in care  9/8 consider decrease baclofen if dyskinetic movements continue  16. Bowel and bladder incontinence: continue bowel and bladder program   17. Left hand skin maceration: Eucerin ordered, discussed hand split with OT, conitnue  18. History of magnesium deficiency: magnesium 500mg  started HS    02/01/2023    9:03 AM 02/01/2023    6:39 AM 01/31/2023  8:40 PM  Vitals with BMI  Systolic 132 159 161  Diastolic 95 98 89  Pulse 91 85 97    19. HTN: see above Increase tizanidine to 6mg  TID. Magnesium supplement  added HS. Increase propanolol to 10mg  TID,Mild elevation monitor as there has been some variablity May need antihypertensive med or change tizanidine to clonidine     02/01/2023    9:03 AM 02/01/2023    6:39 AM 01/31/2023    8:40 PM  Vitals with BMI  Systolic 132 159 096  Diastolic 95 98 89  Pulse 91 85 97     20. Area of redness between left thumb and index finger: continue foam dressing  21. Scalp eczema: selsun blue ordered, continue  -9/7 patient's mother feels like this is causing her to itch, will start some hydrocortisone cream  22. Fatigue: improved, discussed with team may be secondary to anti-spasticity medications, B12 reviewed and is 335, in suboptimal range, will give 1,000U B12 injection 8/7, will order monthly as needed, metanx started, increase to BID, continue  23. Right wrist swelling: XR ordered and shows no acute fracture, shows 2.73mm ulnar variance, brace removed to given patient a break from it. Voltaren gel ordered prn for discomfort, continue, CT ordered and show no acute osseus injury, continue ace wrap  23. Dysphagia: continue D3/thins, d/c ensure as blood pressure severely elevates whenever restarted  24. MRSA nares positive: negative on repeat, precautions d/ced  25. Tachycardic: resolved, increase propanolol to BID.  continue Tizanidine increased to 6mg  TID. Resolved, amantadine increased back to 100mg  BID, continue this dose  26.  Dyskinetic movements in face and head Appear to have resolved, EEG reviewed and was negative for seizure.   27. Small sore on the base of 5th metatarsal head: local wound care, continue  28. Rash on face: resolved, discussed with mother could have been secondary to added sugars in nutritional supplements  29. Sacral pressure injury: air mattress ordered, Placed nursing order to offload q1 hour while awake, continue medihoney, wound care consulted, discussed with nursing that this is improving  30. Cervical extensor weakness:  tried decreasing tizanidine but spasticity appears increased and BP elevated so will increase back to 6mg  TID, continue this dose  31. Low protein stores: prosource ordered, continue   LOS: 75 days A FACE TO FACE EVALUATION WAS PERFORMED  Drema Pry Domani Bakos 02/01/2023, 11:34 AM

## 2023-02-01 NOTE — Progress Notes (Signed)
Occupational Therapy Session Note  Patient Details  Name: Nicole [name] MRN: [id number] Date of Birth: 06/20/1975  Today's Date: 02/01/2023 OT Individual Time: 1415-1510 OT Individual Time Calculation (min): 55 min    Short Term Goals: Week 10:  OT Short Term Goal 1 (Week 1): Patient will reach forward to grasp familiar item needed for ADL task with min assist OT Short Term Goal 1 - Progress (Week 1): Not met OT Short Term Goal 2 (Week 1): Patient will indicate preference or need 3/5 times in session by verbal response, or head nod/shake OT Short Term Goal 2 - Progress (Week 1): Not met OT Short Term Goal 3 (Week 1): Patient will demonstrate 75% digit extension in left hand in preparation for left hand grasp/release OT Short Term Goal 3 - Progress (Week 1): Not met  Skilled Therapeutic Interventions/Progress Updates:   Patient received seated in wheelchair.  Patient awake and agreeable to go to gym to listen to music from [employer].  Patient transported to Dayroom, and indicated she was familiar with music playing.  Attempted to have patient use pen and paper.  Patient able to click pen, and make mark on paper deliberately  -but motions perseverative.  She did make a circle when asked to write an"o."  Preparing patient for transfer to mat table, when it became apparent that she was soaked through her brief and pants.  Transported back to room, and back to bed - with emphasis on accepting weight through BLE Assisted patient to supine and used rolling to help clean and apply dry brief.  Patient then positioned in bed for the evening with BLE braces and B elbow braces.  Bed in flat position to promote extension.  When attempting to guide patient's head to pillow she said "It is hard to keep my head still."  Patient left up in bed with call bell/ personal items nearby.    Therapy Documentation Precautions:  Precautions Precautions: Fall Precaution Comments: Contact precautions & delayed  processing Required Braces or Orthoses: Other Brace Other Brace: B adjustable night splints; B elbow "cosey" splints; L palm protector; R mitt Restrictions Weight Bearing Restrictions: No   Pain: Pain Assessment Pain Scale: 0-10 Pain Score: 0-No pain      Therapy/Group: Individual Therapy  [name] 02/01/2023, 3:17 PM

## 2023-02-02 LAB — CREATININE, SERUM
Creatinine, Ser: 0.59 mg/dL (ref 0.44–1.00)
GFR, Estimated: >60 mL/min (ref >60)

## 2023-02-02 NOTE — Progress Notes (Signed)
Patient ID: Nicole [name], female   DOB: Jan 28, 1976, 47 y.o.   MRN: [id number]  Spoke with Mom she is in the hospital at [facility] and will be there a couple days and will not be visiting daughter. Will let daughter know.

## 2023-02-02 NOTE — Progress Notes (Signed)
Occupational Therapy Weekly Progress Note  Patient Details  Name: SANAAI SABADO MRN: [id number] Date of Birth: 1975/07/13  Beginning of progress report period: January 26, 2023 End of progress report period: February 02, 2023  Today's Date: 02/02/2023 OT Individual Time: 7829-5621 OT Individual Time Calculation (min): 30 min    Patient has met 1 of 3 short term goals.  Patient is showing some progress toward remaining two short term goals due to increased attention and participation in therapy.    Patient continues to demonstrate the following deficits: muscle joint tightness, impaired timing and sequencing, abnormal tone, unbalanced muscle activation, and decreased coordination, decreased attention to right and decreased motor planning, decreased initiation, decreased attention, decreased awareness, decreased problem solving, decreased safety awareness, decreased memory, and delayed processing, and decreased sitting balance, decreased standing balance, decreased postural control, hemiplegia, and decreased balance strategies and therefore will continue to benefit from skilled OT intervention to enhance overall performance with BADL.  Patient progressing toward long term goals..  Continue plan of care.  OT Short Term Goals Week 10:  OT Short Term Goal 1 (Week 10): Patient will reach forward to grasp familiar item needed for ADL task with min assist OT Short Term Goal 1 - Progress (Week 10): Progressing toward goal OT Short Term Goal 2 (Week 10): Patient will indicate preference or need 3/5 times in session by verbal response, or head nod/shake OT Short Term Goal 2 - Progress (Week 10): Met OT Short Term Goal 3 (Week 10): Patient will demonstrate 75% digit extension in left hand in preparation for left hand grasp/release OT Short Term Goal 3 - Progress (Week 10): Progressing toward goal Week 11:  OT Short Term Goal 1 (Week 11): Patient will reach toward and grasp needed familiar functional  item x 3 in one session  OT Short Term Goal 2 (Week 11): Patient will demonstrate 75% digit extension in left hand following manual stretch  OT Short Term Goal 3 (Week 11): Patient will sit unsupported x 5 seconds in preparation for level surface transfer   Skilled Therapeutic Interventions/Progress Updates:    Patient received sitting up in wheelchair at nurses station, sleeping.  Patient transported to room.  Patient woke easily, and agreed to shower.  +2 assistance to swap wheelchair for shower chair while patient standing with max assist. Patient showered with total assist - helping to wash hair, face and chest with cueing and set up.  Patient talking throughout this session.  Transferred back to bed to don brief and lower body clothing, then up to chair for upper body and grooming.  Patient left up in chair with left resting hand splint on and right mitt on to prevent further scratching at scalp.  Lap tray in place and pillows under arm to promote optimal positioning.    Therapy Documentation Precautions:  Precautions Precautions: Fall Precaution Comments: Contact precautions & delayed processing Required Braces or Orthoses: Other Brace Other Brace: B adjustable night splints; B elbow "cosey" splints; L palm protector; R mitt Restrictions Weight Bearing Restrictions: No   Pain: Pain Assessment Pain Scale: 0-10 Pain Score: 0-No pain     Therapy/Group: Individual Therapy  [name] 02/02/2023, 12:37 PM

## 2023-02-02 NOTE — Progress Notes (Signed)
PROGRESS NOTE   Subjective/Complaints: No new complaints this morning Answering yes/no accurately, greeting pleasantly Tolerated therapy today  ROS: Denies pain  Objective:   No results found. No results for input(s): "WBC", "HGB", "HCT", "PLT" in the last 72 hours.    Recent Labs    02/02/23 0530  CREATININE 0.59         Intake/Output Summary (Last 24 hours) at 02/02/2023 1032 Last data filed at 02/02/2023 0849 Gross per 24 hour  Intake 480 ml  Output --  Net 480 ml      Pressure Injury 10/31/22 Buttocks Left Unstageable - Full thickness tissue loss in which the base of the injury is covered by slough (yellow, tan, gray, green or brown) and/or eschar (tan, brown or black) in the wound bed. (Active)  10/31/22 2000  Location: Buttocks  Location Orientation: Left  Staging: Unstageable - Full thickness tissue loss in which the base of the injury is covered by slough (yellow, tan, gray, green or brown) and/or eschar (tan, brown or black) in the wound bed.  Wound Description (Comments):   Present on Admission: Yes     Pressure Injury 11/18/22 Toe (Comment  which one) Anterior;Right Deep Tissue Pressure Injury - Purple or maroon localized area of discolored intact skin or blood-filled blister due to damage of underlying soft tissue from pressure and/or shear. small are (Active)  11/18/22 1300  Location: Toe (Comment  which one)  Location Orientation: Anterior;Right  Staging: Deep Tissue Pressure Injury - Purple or maroon localized area of discolored intact skin or blood-filled blister due to damage of underlying soft tissue from pressure and/or shear.  Wound Description (Comments): small area on right great toe metatarsal  Present on Admission: Yes    Physical Exam: Vital Signs Blood pressure (!) 146/88, pulse 99, temperature 97.9 F (36.6 C), resp. rate 19, height 5\' 1"  (1.549 m), weight 48 kg, SpO2  97%.  Gen: no distress, normal appearing HEENT: oral mucosa pink and moist, NCAT Cardio: Reg rate Chest: normal effort, normal rate of breathing Abd: soft, non-distended Ext: no edema Psych: pleasant, normal affect Skin: + Scaling dried skin on head present-2 small excoriations, sore on the base of the 5th metatarsal head, rash on face . stage 2 to right toe, area of redness between left thumb and index finger-covered in dressing, left buttock unstageable area not viewed +redness to bilateral elbows  Neurologic: Minimal engagement on exam, tired. Answers "good morning"  Does not follow simple commands. Delayed processing  Tone:  MAS 3 right elbow,MAS 1 in RIght finger and wrist flexors  MAS 3 knee flexors, and MAS 3 L>R  hip adductors  MAS 3 L elbow , MAS 3-4 in L wrist and finger flexors  + LUE WHO  Motor 0/5 strength in LUE and BLE, some purposeful movement in RUE ; reflexively grasps hand Improved cervical extension  Withdraws to pain in bilateral lower and right upper extremities. Musculoskeletal: reduced bilateral shoulder and elbow ROM due to increased tone  Right wrist swelling -Ace wrapped.   Assessment/Plan: 1. Functional deficits which require 3+ hours per day of interdisciplinary therapy in a comprehensive inpatient rehab setting. Physiatrist is providing close team  supervision and 24 hour management of active medical problems listed below. Physiatrist and rehab team continue to assess barriers to discharge/monitor patient progress toward functional and medical goals  Care Tool:  Bathing    Body parts bathed by patient: Face, Abdomen, Chest   Body parts bathed by helper: Right arm, Left arm, Front perineal area, Buttocks, Right upper leg, Left upper leg, Right lower leg, Left lower leg Body parts n/a: Right arm, Left arm, Front perineal area, Buttocks, Right upper leg, Left upper leg, Right lower leg, Left lower leg   Bathing assist Assist Level: Maximal  Assistance - Patient 24 - 49%     Upper Body Dressing/Undressing Upper body dressing   What is the patient wearing?: Pull over shirt    Upper body assist Assist Level: Total Assistance - Patient < 25%    Lower Body Dressing/Undressing Lower body dressing      What is the patient wearing?: Pants, Incontinence brief     Lower body assist Assist for lower body dressing: Dependent - Patient 0%     Toileting Toileting Toileting Activity did not occur (Clothing management and hygiene only): N/A (no void or bm)  Toileting assist Assist for toileting: Total Assistance - Patient < 25%     Transfers Chair/bed transfer  Transfers assist  Chair/bed transfer activity did not occur: Safety/medical concerns  Chair/bed transfer assist level: Dependent - Patient 0%     Locomotion Ambulation   Ambulation assist   Ambulation activity did not occur: Safety/medical concerns  Assist level: 2 helpers   Max distance: 72ft   Walk 10 feet activity   Assist  Walk 10 feet activity did not occur: Safety/medical concerns  Assist level: 2 helpers     Walk 50 feet activity   Assist Walk 50 feet with 2 turns activity did not occur: Safety/medical concerns         Walk 150 feet activity   Assist Walk 150 feet activity did not occur: Safety/medical concerns         Walk 10 feet on uneven surface  activity   Assist Walk 10 feet on uneven surfaces activity did not occur: Safety/medical concerns         Wheelchair     Assist Is the patient using a wheelchair?: Yes Type of Wheelchair: Manual    Wheelchair assist level: Dependent - Patient 0% Max wheelchair distance: 67ft    Wheelchair 50 feet with 2 turns activity    Assist    Wheelchair 50 feet with 2 turns activity did not occur: Safety/medical concerns   Assist Level: Dependent - Patient 0%   Wheelchair 150 feet activity     Assist  Wheelchair 150 feet activity did not occur: Safety/medical  concerns   Assist Level: Dependent - Patient 0%   Blood pressure (!) 146/88, pulse 99, temperature 97.9 F (36.6 C), resp. rate 19, height 5\' 1"  (1.549 m), weight 48 kg, SpO2 97%.  Medical Problem List and Plan: 1. 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  Impaired mobility: continue Lovenox, weekly creatinine ordered             -antiplatelet therapy: N/A 3. Pain Management: Tylenol as needed  4. History of anxiety: discussed with her mom that she feels this was a big risk factor for her accident  5. Neuropsych/cognition: This patient is not capable of making decisions on her own behalf. 6. Left buttock unstageable ulcer.  continue Medihoney to buttock wound daily cover with foam dressing.  Changing dressing every 3 days or as needed soiling   7. Fluids/Electrolytes/Nutrition: Routine in and outs with follow-up chemistries  -Eating 100% most meals with assist 8.  Cavitary right lower lobe pneumonia likely aspiration pneumonia/MRSA pneumonia.  Resolved    Latest Ref Rng & Units 01/22/2023    6:36 AM 12/20/2022   10:54 AM 12/11/2022    7:15 AM  CBC  WBC 4.0 - 10.5 K/uL 8.1  5.6   9.0   Hemoglobin 12.0 - 15.0 g/dL 30.8  65.7  84.6   Hematocrit 36.0 - 46.0 % 47.5  42.8  45.1   Platelets 150 - 400 K/uL 274  253  329      9.  History of drug abuse.  Positive cocaine on urine drug screen.  Provide counseling  10.  AKI/hypovolemia and ATN.  resolved    Latest Ref Rng & Units 02/02/2023    5:30 AM 01/26/2023    3:59 PM 01/22/2023    6:36 AM  BMP  Glucose 70 - 99 mg/dL   90   BUN 6 - 20 mg/dL   14   Creatinine 9.62 - 1.00 mg/dL 9.52  8.41  3.24   Sodium 135 - 145 mmol/L   139   Potassium 3.5 - 5.1 mmol/L   3.9   Chloride 98 - 111 mmol/L   104   CO2 22 - 32 mmol/L   26   Calcium 8.9 - 10.3 mg/dL   9.1     11.  Mild transaminitis with rhabdomyolysis.  Both resolved  12.  Hypotension. Resolved, d/c midodrine  13. Impaired initiation: continue amantadine 100mg  BID  14. Suboptimal vitamin D: continue 1,000U D3 daily  15. Spasticity:   -discussed serial casting with OT and her mom  - continue baclofen to 20mg  TID and tizanidine to 4mg  TID. Continue wrist, elbow brace, and PRAFOs. Ordered a night splint for LLE  Repeat CMP today given chronic tizanidine use  -9/7 continues to have significant tone despite use of tizanidine and baclofen, may be beneficial consider baclofen pump in outpatient setting if this causes significant difficulties in care  9/8 consider decrease baclofen if dyskinetic movements continue  16. Bowel and bladder incontinence: continue bowel and bladder program   17. Left hand skin maceration: Eucerin ordered, discussed hand split with OT, conitnue  18. History of magnesium deficiency: magnesium 500mg  started HS    02/02/2023    3:23 AM 02/01/2023    7:39 PM 02/01/2023    3:37 PM  Vitals with BMI  Systolic 146 135 401  Diastolic 88 87 89  Pulse 99 103 97    19. HTN: see above Increase tizanidine to 6mg  TID. Magnesium supplement added HS. Increase propanolol to 10mg  TID,Mild elevation monitor as there has been some variablity May need  antihypertensive med or change tizanidine to clonidine     02/02/2023    3:23 AM 02/01/2023    7:39 PM 02/01/2023    3:37 PM  Vitals with BMI  Systolic 146 135 161  Diastolic 88 87 89  Pulse 99 103 97     20. Area of redness between left thumb and index finger: continue foam dressing  21. Scalp eczema: selsun blue ordered, continue  -9/7 patient's mother feels like this is causing her to itch, will start some hydrocortisone cream  22. Fatigue: improved, discussed with team may be secondary to anti-spasticity medications, B12 reviewed and is 335, in suboptimal range, will give 1,000U B12 injection 8/7, will order monthly as needed, metanx started, increase to BID, continue  23. Right wrist swelling: XR ordered and shows no acute fracture, shows 2.57mm ulnar variance, brace removed to given patient a break from it. Voltaren gel ordered prn for discomfort, continue, CT ordered and show no acute osseus injury, continue ace wrap  23. Dysphagia: continue D3/thins, d/c ensure as blood pressure severely elevates whenever restarted  24. MRSA nares positive: negative on repeat, precautions d/ced  25. Tachycardic: resolved, increase propanolol to BID.  continueTizanidine increased to 6mg  TID. Resolved, amantadine increased back to 100mg  BID, continue this dose  26.  Dyskinetic movements in face and head Appear to have resolved, EEG reviewed and was negative for seizure.   27. Small sore on the base of 5th metatarsal head: local wound care, continue  28. Rash on face: resolved, discussed with mother could have been secondary to added sugars in nutritional supplements  29. Sacral pressure injury: air mattress ordered, Placed nursing order to offload q1 hour while awake, continue medihoney, wound care consulted, discussed with nursing that this is improving  30. Cervical extensor weakness: tried decreasing tizanidine but spasticity appears increased and BP elevated so will increase back to 6mg   TID, continue this dose  31. Low protein stores: prosource ordered, continue   LOS: 76 days A FACE TO FACE EVALUATION WAS PERFORMED  Drema Pry Roberta Kelly 02/02/2023, 10:32 AM

## 2023-02-02 NOTE — Progress Notes (Signed)
Physical Therapy Session Note  Patient Details  Name: Nicole [name] MRN: [id number] Date of Birth: Aug 04, 1975  Today's Date: 02/02/2023 PT Individual Time: 0900-0930 PT Individual Time Calculation (min): 30 min   Short Term Goals: Week 9: PT Short Term Goal 1 (Week 9): Pt will initiate bed mobility with totalA PT Short Term Goal 2 (Week 9): Pt will maintain unsupported sitting balance for 30 seconds without LOB PT Short Term Goal 3 (Week 9): Pt will complete bed<>chair transfers with no more than totalA  Skilled Therapeutic Interventions/Progress Updates:      Pt in bed, incontinent x2. Dependent assist for brief change and pericare. Applied cream due to redness in gluteal cleft. Supine<>sitting with dependent assist and completed lateral scoot transfer into TIS w/c. Noted redness (blanchable) on elbows (L>R), soft elbow protectors donned. Pt parked at nurses station for change of environment and increased stimulation. LPN made aware of patient status. All needs met.    Therapy Documentation Precautions:  Precautions Precautions: Fall Precaution Comments: Contact precautions & delayed processing Required Braces or Orthoses: Other Brace Other Brace: B adjustable night splints; B elbow "cosey" splints; L palm protector; R mitt Restrictions Weight Bearing Restrictions: No General:      Therapy/Group: Individual Therapy  Kiernan Farkas P Kelli Egolf PT 02/02/2023, 7:50 AM

## 2023-02-02 NOTE — Progress Notes (Signed)
Speech Language Pathology Weekly Progress and Session Note  Patient Details  Name: Nicole [name] MRN: [id number] Date of Birth: 1975-05-29  Beginning of progress report period: January 26, 2023 End of progress report period: February 02, 2023  Today's Date: 02/02/2023 SLP Individual Time: 1601-0932 SLP Individual Time Calculation (min): 27 min  Short Term Goals: Week 10: SLP Short Term Goal 1 (Week 10): Pt will present w/ appropriate response to functional stimuli (i.e. accept spoon, grasp basic item appropriately, response to tactile/auditory stimuli) 40% of the time w/ maxA multimodal cues SLP Short Term Goal 1 - Progress (Week 10): Met SLP Short Term Goal 2 (Week 10): Pt will answer open ended questions w/ modA in 50% of opportunities SLP Short Term Goal 2 - Progress (Week 9): Not met SLP Short Term Goal 3 (Week 10):Pt will answer basic yes/no questions with modA in 50% of opportunities SLP Short Term Goal 3 - Progress (Week 10): Met SLP Short Term Goal 4 (Week 10): Pt will utilize increased vocal intensity w/ maxA verbal cues to remain 70% intelligibile at phrase level SLP Short Term Goal 4 - Progress (Week 10): Not met    New Short Term Goals: Week 11: SLP Short Term Goal 1 (Week 11):  Pt will present w/ appropriate response to functional stimuli (i.e. accept spoon, grasp basic item appropriately, response to tactile/auditory stimuli) 50% of the time w/ maxA multimodal cues SLP Short Term Goal 2 (Week 11): Pt will answer open ended questions w/ modA in 50% of opportunities SLP Short Term Goal 3 (Week 11): Pt will answer basic yes/no questions with modA in 60% of opportunities SLP Short Term Goal 4 (Week 11): Pt will utilize increased vocal intensity w/ maxA verbal cues to remain 70% intelligibile at phrase level  Weekly Progress Updates:  Patient continues to make slow progress and has met 2/4 STGs this week.  This reporting period, she continues to present with increased initiation  of verbal responses (Y/N and open ended responses), increased spontaneous speech, and increased response to auditory, tactile, and verbal stimuli. She continues to demonstrate comprehension of conversational tasks via appropriate laughter, smiling, and verbal responses. Success with communication still fluctuates throughout the day and can be impacted by pain, fatigue, and overall attention. Because of this,  patient requires overall maxA overall for communication at this time. Patient and family education ongoing. Patient would benefit from continued skilled SLP intervention to maximize her functional communication and reduce caregiver.    Intensity: Minumum of 1-2 x/day, 30 to 90 minutes Frequency: 1 to 3 out of 7 days Duration/Length of Stay: awaiting SNF placement Treatment/Interventions: Cognitive remediation/compensation;Internal/external aids;Speech/Language facilitation;Cueing hierarchy;Environmental controls;Therapeutic Activities;Functional tasks;Multimodal communication approach;Patient/family education;Therapeutic Exercise   Daily Session  Skilled Therapeutic Interventions:  Skilled treatment session focused on communication goals. Upon arrival, patient was awake while upright in the wheelchair. SLP facilitated session with basic yes/no questions regarding wants/needs in which patient answered in 90% of opportunities. SLP also asked patient open-ended questions about basic, biographical information that she answered in 50% of opportunities. Patient utilized the calendar with Mod verbal visual cues for orientation to time but required total A for orientation to place when given a field of 2. Patient verbalized a generalized reason for current admission "had to be treated for something" but was unable to provide any more specific information/details. SLP attempted to engage in a structured language task with patient demonstrating minimal ability to name functional items in pictures. Question  true language impairment vs attention due  to being distracted by itching throughout session.  Patient's overall intelligibility remains impaired due to low vocal intensity and a fast rate of speech in which patient requires Max A multimodal cues to self-monitor and correct. Patient left upright in TIS wheelchair with all needs within reach. Continue with current plan of care.    Pain No reports of pain   Therapy/Group: Individual Therapy  Srinika Delone 02/02/2023, 6:30 AM

## 2023-02-03 MED ORDER — AMANTADINE HCL 50 MG/5ML PO SOLN
100.0000 mg | Freq: Two times a day (BID) | ORAL | Status: DC
  Administered 2023-02-03 – 2023-04-11 (×133): 100 mg via ORAL
  Filled 2023-02-03 (×138): qty 10

## 2023-02-03 NOTE — Progress Notes (Signed)
Speech Language Pathology Daily Session Note  Patient Details  Name: [name] MRN: [id number] Date of Birth: 1976-01-19  Today's Date: 02/03/2023 SLP Individual Time: 8413-2440 SLP Individual Time Calculation (min): 59 min  Short Term Goals: Week 9: SLP Short Term Goal 1 (Week 9): Week 11 - Pt will present w/ appropriate response to functional stimuli (i.e. accept spoon, grasp basic item appropriately, response to tactile/auditory stimuli) 50% of the time w/ maxA multimodal cues SLP Short Term Goal 2 (Week 9): Week 11 - Pt will answer open ended questions w/ modA in 50% of opportunities SLP Short Term Goal 3 (Week 9): Week 11 - Pt will answer basic yes/no questions with modA in 60% of opportunities SLP Short Term Goal 4 (Week 9): Week 11 - Pt will utilize increased vocal intensity w/ maxA verbal cues to remain 70% intelligibile at phrase level  Skilled Therapeutic Interventions: Pt seen for skilled ST with focus on communication goals, pt in bed sleeping but rouses with extra time to voice and agreeable to therapeutic tasks. SLP facilitating basic yes/no questions related to positioning, hunger/thirst, pain and other wants/needs which patient responded to in 100% opportunities. Pt with significantly diminished vocal intensity and resulting poor speech intelligibility despite max A cues throughout for increased breath support and loudness. Intelligibility ~30% at short phrase level. SLP attempting to engage patient in simple, biographical open ended questions with patient benefiting from mod A cues for 40% accuracy. [name] focused attention to structured and nonstructured tasks waxes/wanes, pt becoming fatigued toward end of tx session and requiring rest breaks to increase participation. SLP attempting simple word reading task with white board, pt able to read 3/6 words before closing eyes. Pt denies any needs at session concluded, left in bed with all needs met. Cont ST POC.   Pain Pain  Assessment Pain Scale: 0-10 Pain Score: 0-No pain  Therapy/Group: Individual Therapy  Tacey Ruiz 02/03/2023, 10:52 AM

## 2023-02-03 NOTE — Progress Notes (Signed)
PROGRESS NOTE   Subjective/Complaints:  Said slept OK- wants to go back to sleep.  Still tired.  Denies pain.   ROS: denies pain- otherwise limited by sleepiness this AM  Objective:   No results found. No results for input(s): "WBC", "HGB", "HCT", "PLT" in the last 72 hours.    Recent Labs    02/02/23 0530  CREATININE 0.59         Intake/Output Summary (Last 24 hours) at 02/03/2023 1138 Last data filed at 02/03/2023 1610 Gross per 24 hour  Intake 478 ml  Output --  Net 478 ml      Pressure Injury 10/31/22 Buttocks Left Unstageable - Full thickness tissue loss in which the base of the injury is covered by slough (yellow, tan, gray, green or brown) and/or eschar (tan, brown or black) in the wound bed. (Active)  10/31/22 2000  Location: Buttocks  Location Orientation: Left  Staging: Unstageable - Full thickness tissue loss in which the base of the injury is covered by slough (yellow, tan, gray, green or brown) and/or eschar (tan, brown or black) in the wound bed.  Wound Description (Comments):   Present on Admission: Yes     Pressure Injury 11/18/22 Toe (Comment  which one) Anterior;Right Deep Tissue Pressure Injury - Purple or maroon localized area of discolored intact skin or blood-filled blister due to damage of underlying soft tissue from pressure and/or shear. small are (Active)  11/18/22 1300  Location: Toe (Comment  which one)  Location Orientation: Anterior;Right  Staging: Deep Tissue Pressure Injury - Purple or maroon localized area of discolored intact skin or blood-filled blister due to damage of underlying soft tissue from pressure and/or shear.  Wound Description (Comments): small area on right great toe metatarsal  Present on Admission: Yes    Physical Exam: Vital Signs Blood pressure (!) 140/91, pulse 98, temperature 97.7 F (36.5 C), temperature source Oral, resp. rate 18, height 5\' 1"  (1.549  m), weight 48 kg, SpO2 97%.    General: woke to tactile stimuli- very sleepy- supine- in air mattress;NAD HENT: conjugate gaze; oropharynx moist CV: regular rate and rhythm; no JVD Pulmonary: CTA B/L; no W/R/R- good air movement GI: soft, NT, ND, (+)BS Psychiatric: sleeping- woke briefly.  Neurological: sleepy- wearing PRAFOs Skin: + Scaling dried skin on head present-2 small excoriations, sore on the base of the 5th metatarsal head, rash on face . stage 2 to right toe, area of redness between left thumb and index finger-covered in dressing, left buttock unstageable area not viewed +redness to bilateral elbows  Neurologic: Minimal engagement on exam, tired. Answers "good morning"  Does not follow simple commands. Delayed processing  Tone:  MAS 3 right elbow,MAS 1 in RIght finger and wrist flexors  MAS 3 knee flexors, and MAS 3 L>R  hip adductors  MAS 3 L elbow , MAS 3-4 in L wrist and finger flexors  + LUE WHO  Motor 0/5 strength in LUE and BLE, some purposeful movement in RUE ; reflexively grasps hand Improved cervical extension  Withdraws to pain in bilateral lower and right upper extremities. Musculoskeletal: reduced bilateral shoulder and elbow ROM due to increased tone  Right wrist swelling -  Ace wrapped.   Assessment/Plan: 1. Functional deficits which require 3+ hours per day of interdisciplinary therapy in a comprehensive inpatient rehab setting. Physiatrist is providing close team supervision and 24 hour management of active medical problems listed below. Physiatrist and rehab team continue to assess barriers to discharge/monitor patient progress toward functional and medical goals  Care Tool:  Bathing    Body parts bathed by patient: Face, Abdomen, Chest   Body parts bathed by helper: Right arm, Left arm, Front perineal area, Buttocks, Right upper leg, Left upper leg, Right lower leg, Left lower leg Body parts n/a: Right arm, Left arm, Front perineal area, Buttocks,  Right upper leg, Left upper leg, Right lower leg, Left lower leg   Bathing assist Assist Level: Maximal Assistance - Patient 24 - 49%     Upper Body Dressing/Undressing Upper body dressing   What is the patient wearing?: Pull over shirt    Upper body assist Assist Level: Total Assistance - Patient < 25%    Lower Body Dressing/Undressing Lower body dressing      What is the patient wearing?: Pants, Incontinence brief     Lower body assist Assist for lower body dressing: Dependent - Patient 0%     Toileting Toileting Toileting Activity did not occur (Clothing management and hygiene only): N/A (no void or bm)  Toileting assist Assist for toileting: Total Assistance - Patient < 25%     Transfers Chair/bed transfer  Transfers assist  Chair/bed transfer activity did not occur: Safety/medical concerns  Chair/bed transfer assist level: Dependent - Patient 0%     Locomotion Ambulation   Ambulation assist   Ambulation activity did not occur: Safety/medical concerns  Assist level: 2 helpers   Max distance: 36ft   Walk 10 feet activity   Assist  Walk 10 feet activity did not occur: Safety/medical concerns  Assist level: 2 helpers     Walk 50 feet activity   Assist Walk 50 feet with 2 turns activity did not occur: Safety/medical concerns         Walk 150 feet activity   Assist Walk 150 feet activity did not occur: Safety/medical concerns         Walk 10 feet on uneven surface  activity   Assist Walk 10 feet on uneven surfaces activity did not occur: Safety/medical concerns         Wheelchair     Assist Is the patient using a wheelchair?: Yes Type of Wheelchair: Manual    Wheelchair assist level: Dependent - Patient 0% Max wheelchair distance: 41ft    Wheelchair 50 feet with 2 turns activity    Assist    Wheelchair 50 feet with 2 turns activity did not occur: Safety/medical concerns   Assist Level: Dependent - Patient 0%    Wheelchair 150 feet activity     Assist  Wheelchair 150 feet activity did not occur: Safety/medical concerns   Assist Level: Dependent - Patient 0%   Blood pressure (!) 140/91, pulse 98, temperature 97.7 F (36.5 C), temperature source Oral, resp. rate 18, height 5\' 1"  (1.549 m), weight 48 kg, SpO2 97%.  Medical Problem List and Plan: 1. 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  Impaired mobility: continue Lovenox, weekly creatinine ordered             -antiplatelet therapy: N/A 3. Pain Management: Tylenol as needed  4. History of anxiety: discussed with her mom that she feels this was a big risk factor for her accident  5. Neuropsych/cognition: This patient is not capable of making decisions on her own behalf. 6. Left buttock unstageable ulcer.  continue Medihoney to buttock wound daily cover with foam dressing.  Changing dressing every 3 days or as needed soiling   7. Fluids/Electrolytes/Nutrition: Routine in and outs with follow-up chemistries  -Eating 100% most meals with assist 8.  Cavitary right lower lobe pneumonia likely aspiration pneumonia/MRSA pneumonia.  Resolved    Latest Ref Rng &  Units 01/22/2023    6:36 AM 12/20/2022   10:54 AM 12/11/2022    7:15 AM  CBC  WBC 4.0 - 10.5 K/uL 8.1  5.6  9.0   Hemoglobin 12.0 - 15.0 g/dL 60.6  30.1  60.1   Hematocrit 36.0 - 46.0 % 47.5  42.8  45.1   Platelets 150 - 400 K/uL 274  253  329      9.  History of drug abuse.  Positive cocaine on urine drug screen.  Provide counseling  10.  AKI/hypovolemia and ATN.  resolved    Latest Ref Rng & Units 02/02/2023    5:30 AM 01/26/2023    3:59 PM 01/22/2023    6:36 AM  BMP  Glucose 70 - 99 mg/dL   90   BUN 6 - 20 mg/dL   14   Creatinine 0.93 - 1.00 mg/dL 2.35  5.73  2.20   Sodium 135 - 145 mmol/L   139   Potassium 3.5 - 5.1 mmol/L   3.9   Chloride 98 - 111 mmol/L   104   CO2 22 - 32 mmol/L   26   Calcium 8.9 - 10.3 mg/dL   9.1     11.  Mild transaminitis with rhabdomyolysis.  Both resolved  12.  Hypotension. Resolved, d/c midodrine  9/21- BP running 140s systolic-will con't to monitor  13. Impaired initiation: continue amantadine 100mg  BID  14. Suboptimal vitamin D: continue 1,000U D3 daily  15. Spasticity:   -discussed serial casting with OT and her mom  - continue baclofen to 20mg  TID and tizanidine to 4mg  TID. Continue wrist, elbow brace, and PRAFOs. Ordered a night splint for LLE  Repeat CMP today given chronic tizanidine use  -9/7 continues to have significant tone despite use of tizanidine and baclofen, may be beneficial consider baclofen pump in outpatient setting if this causes significant difficulties in care  9/8 consider decrease baclofen if dyskinetic movements continue  9/21- Wearing PRAFOs 16. Bowel and bladder incontinence: continue bowel and bladder program   17. Left hand skin maceration: Eucerin ordered, discussed hand split with OT, conitnue  18. History of magnesium deficiency: magnesium 500mg  started HS    02/03/2023    4:14 AM 02/02/2023  7:00 PM 02/02/2023    3:35 PM  Vitals with BMI  Systolic 140 154 161  Diastolic 91 103 94  Pulse 98 105 108     19. HTN: see above Increase tizanidine to 6mg  TID. Magnesium supplement added HS. Increase propanolol to 10mg  TID,Mild elevation monitor as there has been some variablity May need antihypertensive med or change tizanidine to clonidine  9/21- BP running 140/s90's- as long as doesn't go higher, will not make changes    02/03/2023    4:14 AM 02/02/2023    7:00 PM 02/02/2023    3:35 PM  Vitals with BMI  Systolic 140 154 096  Diastolic 91 103 94  Pulse 98 105 108     20. Area of redness between left thumb and index finger: continue foam dressing  21. Scalp eczema: selsun blue ordered, continue  -9/7 patient's mother feels like this is causing her to itch, will start some hydrocortisone cream  22. Fatigue: improved, discussed with team may be secondary to anti-spasticity medications, B12 reviewed and is 335, in suboptimal range, will give 1,000U B12 injection 8/7, will order monthly as needed, metanx started, increase to BID, continue  23. Right wrist swelling: XR ordered and shows no acute fracture, shows 2.53mm ulnar variance, brace removed to given patient a break from it. Voltaren gel ordered prn for discomfort, continue, CT ordered and show no acute osseus injury, continue ace wrap  23. Dysphagia: continue D3/thins, d/c ensure as blood pressure severely elevates whenever restarted  24. MRSA nares positive: negative on repeat, precautions d/ced  25. Tachycardic: resolved, increase propanolol to BID.  continueTizanidine increased to 6mg  TID. Resolved, amantadine increased back to 100mg  BID, continue this dose  26.  Dyskinetic movements in face and head Appear to have resolved, EEG reviewed and was negative for seizure.   27. Small sore on the base of 5th metatarsal head: local wound care, continue  28. Rash on face: resolved, discussed with mother could have been secondary to added sugars in nutritional supplements  29. Sacral pressure injury: air mattress ordered, Placed nursing  order to offload q1 hour while awake, continue medihoney, wound care consulted, discussed with nursing that this is improving  30. Cervical extensor weakness: tried decreasing tizanidine but spasticity appears increased and BP elevated so will increase back to 6mg  TID, continue this dose  31. Low protein stores: prosource ordered, continue   LOS: 77 days A FACE TO FACE EVALUATION WAS PERFORMED  Shepherd Finnan 02/03/2023, 11:38 AM

## 2023-02-03 NOTE — Progress Notes (Signed)
Speech Language Pathology Daily Session Note  Patient Details  Name: Nicole [name] MRN: [id number] Date of Birth: 1976-05-11  Today's Date: 02/03/2023 SLP Individual Time: 1233-1300 SLP Individual Time Calculation (min): 27 min  Short Term Goals: Week 9: SLP Short Term Goal 1 (Week 9): Week 11 - Pt will present w/ appropriate response to functional stimuli (i.e. accept spoon, grasp basic item appropriately, response to tactile/auditory stimuli) 50% of the time w/ maxA multimodal cues SLP Short Term Goal 2 (Week 9): Week 11 - Pt will answer open ended questions w/ modA in 50% of opportunities SLP Short Term Goal 3 (Week 9): Week 11 - Pt will answer basic yes/no questions with modA in 60% of opportunities SLP Short Term Goal 4 (Week 9): Week 11 - Pt will utilize increased vocal intensity w/ maxA verbal cues to remain 70% intelligibile at phrase level  Skilled Therapeutic Interventions: Skilled therapy session focused on dysphagia and communication goals. Upon entrance, NT aiding patient in consumption of lunch tray. SLP took over and offered D3 textures. Patient with timely mastication and occasional oral residuals, cleared through liquid wash. Noted throat clear x2 during meal which could represent bolus misdirection. Recommend continuation of current diet with full supervision. Communication was addressed through yes/no questions during mealtime. Patient answered yes/no to communicate wants/needs regarding requesting more food, drink, or completion of meal. Upon verbalization indiciative of meal completion, SLP played music for patient. Initially, SLP played reggae, though patient answered "no" when questioned if she was enjoying. SLP then turned music genre to country in which patient responded "yes." SLP questioned if patient would like to continue watching channel on TV at conclusion of session and patient responded "I want to watch it." Patient with increased verbal responses this date and  demonstrating funcitonal progress towards ability to communicate wants/needs. Patient left in bed with alarm set and call bell within reach. Continue POC.    Pain Pain Assessment Pain Scale: 0-10 Pain Score: 0-No pain  Therapy/Group: Individual Therapy  Teryn Gust M.A., CF-SLP 02/03/2023, 1:02 PM

## 2023-02-04 NOTE — Progress Notes (Signed)
PROGRESS NOTE   Subjective/Complaints:  Pt reports no issues Wants to eat- per Nursing student, eating well- letting him know what she likes and chewing well- no drooling out food.  LBM this AM- incontinent per student.    ROS: denies pain- otherwise limited due to cognition  Objective:   No results found. No results for input(s): "WBC", "HGB", "HCT", "PLT" in the last 72 hours.    Recent Labs    02/02/23 0530  CREATININE 0.59         Intake/Output Summary (Last 24 hours) at 02/04/2023 1357 Last data filed at 02/04/2023 1255 Gross per 24 hour  Intake 837 ml  Output --  Net 837 ml      Pressure Injury 10/31/22 Buttocks Left Unstageable - Full thickness tissue loss in which the base of the injury is covered by slough (yellow, tan, gray, green or brown) and/or eschar (tan, brown or black) in the wound bed. (Active)  10/31/22 2000  Location: Buttocks  Location Orientation: Left  Staging: Unstageable - Full thickness tissue loss in which the base of the injury is covered by slough (yellow, tan, gray, green or brown) and/or eschar (tan, brown or black) in the wound bed.  Wound Description (Comments):   Present on Admission: Yes     Pressure Injury 11/18/22 Toe (Comment  which one) Anterior;Right Deep Tissue Pressure Injury - Purple or maroon localized area of discolored intact skin or blood-filled blister due to damage of underlying soft tissue from pressure and/or shear. small are (Active)  11/18/22 1300  Location: Toe (Comment  which one)  Location Orientation: Anterior;Right  Staging: Deep Tissue Pressure Injury - Purple or maroon localized area of discolored intact skin or blood-filled blister due to damage of underlying soft tissue from pressure and/or shear.  Wound Description (Comments): small area on right great toe metatarsal  Present on Admission: Yes    Physical Exam: Vital Signs Blood pressure (!)  123/96, pulse 94, temperature 98.6 F (37 C), temperature source Oral, resp. rate 16, height 5\' 1"  (1.549 m), weight 48 kg, SpO2 97%.     General: awake, alert, eating with nursing student feeding pt;  NAD HENT: conjugate gaze; oropharynx moist not pocketing food CV: regular rate and rhythm- rate 90's; no JVD Pulmonary: CTA B/L; no W/R/R- good air movement GI: soft, NT, ND, (+)BS Psychiatric: flat Neurological:oriented to self  Skin: + Scaling dried skin on head present-2 small excoriations, sore on the base of the 5th metatarsal head, rash on face . stage 2 to right toe, area of redness between left thumb and index finger-covered in dressing, left buttock unstageable area not viewed +redness to bilateral elbows  Neurologic: Minimal engagement on exam, tired. Answers "good morning"  Does not follow simple commands. Delayed processing  Tone:  MAS 3 right elbow,MAS 1 in RIght finger and wrist flexors  MAS 3 knee flexors, and MAS 3 L>R  hip adductors  MAS 3 L elbow , MAS 3-4 in L wrist and finger flexors  + LUE WHO  Motor 0/5 strength in LUE and BLE, some purposeful movement in RUE ; reflexively grasps hand Improved cervical extension  Withdraws to pain in bilateral  lower and right upper extremities. Musculoskeletal: reduced bilateral shoulder and elbow ROM due to increased tone  Right wrist swelling -Ace wrapped.   Assessment/Plan: 1. Functional deficits which require 3+ hours per day of interdisciplinary therapy in a comprehensive inpatient rehab setting. Physiatrist is providing close team supervision and 24 hour management of active medical problems listed below. Physiatrist and rehab team continue to assess barriers to discharge/monitor patient progress toward functional and medical goals  Care Tool:  Bathing    Body parts bathed by patient: Face, Abdomen, Chest   Body parts bathed by helper: Right arm, Left arm, Front perineal area, Buttocks, Right upper leg, Left upper  leg, Right lower leg, Left lower leg Body parts n/a: Right arm, Left arm, Front perineal area, Buttocks, Right upper leg, Left upper leg, Right lower leg, Left lower leg   Bathing assist Assist Level: Maximal Assistance - Patient 24 - 49%     Upper Body Dressing/Undressing Upper body dressing   What is the patient wearing?: Pull over shirt    Upper body assist Assist Level: Total Assistance - Patient < 25%    Lower Body Dressing/Undressing Lower body dressing      What is the patient wearing?: Pants, Incontinence brief     Lower body assist Assist for lower body dressing: Dependent - Patient 0%     Toileting Toileting Toileting Activity did not occur (Clothing management and hygiene only): N/A (no void or bm)  Toileting assist Assist for toileting: Total Assistance - Patient < 25%     Transfers Chair/bed transfer  Transfers assist  Chair/bed transfer activity did not occur: Safety/medical concerns  Chair/bed transfer assist level: Dependent - Patient 0%     Locomotion Ambulation   Ambulation assist   Ambulation activity did not occur: Safety/medical concerns  Assist level: 2 helpers   Max distance: 17ft   Walk 10 feet activity   Assist  Walk 10 feet activity did not occur: Safety/medical concerns  Assist level: 2 helpers     Walk 50 feet activity   Assist Walk 50 feet with 2 turns activity did not occur: Safety/medical concerns         Walk 150 feet activity   Assist Walk 150 feet activity did not occur: Safety/medical concerns         Walk 10 feet on uneven surface  activity   Assist Walk 10 feet on uneven surfaces activity did not occur: Safety/medical concerns         Wheelchair     Assist Is the patient using a wheelchair?: Yes Type of Wheelchair: Manual    Wheelchair assist level: Dependent - Patient 0% Max wheelchair distance: 25ft    Wheelchair 50 feet with 2 turns activity    Assist    Wheelchair 50 feet  with 2 turns activity did not occur: Safety/medical concerns   Assist Level: Dependent - Patient 0%   Wheelchair 150 feet activity     Assist  Wheelchair 150 feet activity did not occur: Safety/medical concerns   Assist Level: Dependent - Patient 0%   Blood pressure (!) 123/96, pulse 94, temperature 98.6 F (37 C), temperature source Oral, resp. rate 16, height 5\' 1"  (1.549 m), weight 48 kg, SpO2 97%.  Medical Problem List and Plan: 1. 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  Impaired mobility: continue Lovenox, weekly creatinine ordered             -antiplatelet therapy: N/A 3. Pain Management: Tylenol as needed  4. History of anxiety: discussed with her mom that she feels this was a big risk factor for her accident  5. Neuropsych/cognition: This patient is not capable of making decisions on her own behalf. 6. Left buttock unstageable ulcer.  continue Medihoney to buttock wound daily cover with foam dressing.  Changing dressing every 3 days or as needed soiling   7. Fluids/Electrolytes/Nutrition: Routine in and outs with follow-up chemistries  -Eating 100% most meals with assist  9/22- eating  when fed 8.  Cavitary right lower lobe pneumonia likely aspiration pneumonia/MRSA pneumonia.  Resolved    Latest Ref Rng & Units 01/22/2023    6:36 AM 12/20/2022   10:54 AM 12/11/2022    7:15 AM  CBC  WBC 4.0 - 10.5 K/uL 8.1  5.6  9.0   Hemoglobin 12.0 - 15.0 g/dL 40.9  81.1  91.4   Hematocrit 36.0 - 46.0 % 47.5  42.8  45.1   Platelets 150 - 400 K/uL 274  253  329      9.  History of drug abuse.  Positive cocaine on urine drug screen.  Provide counseling  10.  AKI/hypovolemia and ATN.  resolved    Latest Ref Rng & Units 02/02/2023    5:30 AM 01/26/2023    3:59 PM 01/22/2023    6:36 AM  BMP  Glucose 70 - 99 mg/dL   90   BUN 6 - 20 mg/dL   14   Creatinine 7.82 - 1.00 mg/dL 9.56  2.13  0.86   Sodium 135 - 145 mmol/L   139   Potassium 3.5 - 5.1 mmol/L   3.9   Chloride 98 - 111 mmol/L   104   CO2 22 - 32 mmol/L   26   Calcium 8.9 - 10.3 mg/dL   9.1     11.  Mild transaminitis with rhabdomyolysis.  Both resolved  12.  Hypotension. Resolved, d/c midodrine  9/21- BP running 140s systolic-will con't to monitor   9/22- BP running 120s-140's over 90s- con't to monitor 13. Impaired initiation: continue amantadine 100mg  BID  14. Suboptimal vitamin D: continue 1,000U D3 daily  15. Spasticity:   -discussed serial casting with OT and her mom  - continue baclofen to 20mg  TID and tizanidine to 4mg  TID. Continue wrist, elbow brace, and PRAFOs. Ordered a night splint for LLE  Repeat CMP today given chronic tizanidine use  -9/7 continues to have significant tone despite use of tizanidine and baclofen, may be beneficial consider baclofen pump in outpatient setting if this causes significant difficulties in care  9/8 consider decrease baclofen if dyskinetic movements continue  9/21- Wearing PRAFOs 16. Bowel and bladder incontinence: continue bowel and  bladder program   17. Left hand skin maceration: Eucerin ordered, discussed hand split with OT, conitnue  18. History of magnesium deficiency:  magnesium 500mg  started HS    02/04/2023    1:16 PM 02/04/2023    4:29 AM 02/03/2023    7:39 PM  Vitals with BMI  Systolic 123 142 161  Diastolic 96 99 90  Pulse 94 101 95    19. HTN: see above Increase tizanidine to 6mg  TID. Magnesium supplement added HS. Increase propanolol to 10mg  TID,Mild elevation monitor as there has been some variablity May need antihypertensive med or change tizanidine to clonidine  9/21- BP running 140/s90's- as long as doesn't go higher, will not make changes  9/22- stable- con't regimen    02/04/2023    1:16 PM 02/04/2023    4:29 AM 02/03/2023    7:39 PM  Vitals with BMI  Systolic 123 142 096  Diastolic 96 99 90  Pulse 94 101 95     20. Area of redness between left thumb and index finger: continue foam dressing  21. Scalp eczema: selsun blue ordered, continue  -9/7 patient's mother feels like this is causing her to itch, will start some hydrocortisone cream  22. Fatigue: improved, discussed with team may be secondary to anti-spasticity medications, B12 reviewed and is 335, in suboptimal range, will give 1,000U B12 injection 8/7, will order monthly as needed, metanx started, increase to BID, continue  23. Right wrist swelling: XR ordered and shows no acute fracture, shows 2.11mm ulnar variance, brace removed to given patient a break from it. Voltaren gel ordered prn for discomfort, continue, CT ordered and show no acute osseus injury, continue ace wrap  23. Dysphagia: continue D3/thins, d/c ensure as blood pressure severely elevates whenever restarted  24. MRSA nares positive: negative on repeat, precautions d/ced  25. Tachycardic: resolved, increase propanolol to BID.  continueTizanidine increased to 6mg  TID. Resolved, amantadine increased back to 100mg  BID, continue this dose  26.  Dyskinetic movements in face and head Appear to have resolved, EEG reviewed and was negative for seizure.   27. Small sore on the base of 5th metatarsal head: local wound  care, continue  28. Rash on face: resolved, discussed with mother could have been secondary to added sugars in nutritional supplements  29. Sacral pressure injury: air mattress ordered, Placed nursing order to offload q1 hour while awake, continue medihoney, wound care consulted, discussed with nursing that this is improving  30. Cervical extensor weakness: tried decreasing tizanidine but spasticity appears increased and BP elevated so will increase back to 6mg  TID, continue this dose  31. Low protein stores: prosource ordered, continue   LOS: 78 days A FACE TO FACE EVALUATION WAS PERFORMED  Nicole [name] 02/04/2023, 1:57 PM

## 2023-02-05 DIAGNOSIS — Z515 Encounter for palliative care: Secondary | ICD-10-CM

## 2023-02-05 DIAGNOSIS — Z7189 Other specified counseling: Secondary | ICD-10-CM

## 2023-02-05 LAB — CBC WITH DIFFERENTIAL/PLATELET
Abs Immature Granulocytes: 0.02 10*3/uL (ref 0.00–0.07)
Basophils Absolute: 0 10*3/uL (ref 0.0–0.1)
Basophils Relative: 0 %
Eosinophils Absolute: 0.1 10*3/uL (ref 0.0–0.5)
Eosinophils Relative: 1 %
HCT: 46.7 % — ABNORMAL HIGH (ref 36.0–46.0)
Hemoglobin: 15.5 g/dL — ABNORMAL HIGH (ref 12.0–15.0)
Immature Granulocytes: 0 %
Lymphocytes Relative: 25 %
Lymphs Abs: 2.2 10*3/uL (ref 0.7–4.0)
MCH: 31.7 pg (ref 26.0–34.0)
MCHC: 33.2 g/dL (ref 30.0–36.0)
MCV: 95.5 fL (ref 80.0–100.0)
Monocytes Absolute: 0.8 10*3/uL (ref 0.1–1.0)
Monocytes Relative: 9 %
Neutro Abs: 5.4 10*3/uL (ref 1.7–7.7)
Neutrophils Relative %: 65 %
Platelets: 284 10*3/uL (ref 150–400)
RBC: 4.89 MIL/uL (ref 3.87–5.11)
RDW: 12.5 % (ref 11.5–15.5)
WBC: 8.5 10*3/uL (ref 4.0–10.5)
nRBC: 0 % (ref 0.0–0.2)

## 2023-02-05 LAB — BASIC METABOLIC PANEL
Anion gap: 7 (ref 5–15)
BUN: 13 mg/dL (ref 6–20)
CO2: 28 mmol/L (ref 22–32)
Calcium: 9 mg/dL (ref 8.9–10.3)
Chloride: 103 mmol/L (ref 98–111)
Creatinine, Ser: 0.52 mg/dL (ref 0.44–1.00)
GFR, Estimated: >60 mL/min (ref >60)
Glucose, Bld: 111 mg/dL — ABNORMAL HIGH (ref 70–99)
Potassium: 4 mmol/L (ref 3.5–5.1)
Sodium: 138 mmol/L (ref 135–145)

## 2023-02-05 NOTE — Progress Notes (Signed)
   Palliative Medicine Inpatient Follow Up Note HPI: 47 y.o. female  with past medical history of asthma, anxiety/depression, drug abuse. Admitted on 11/18/2022 after being found down, unresponsive for unknown time. Required intubation. CT and MRI indicated concerns for anoxic vs metabolic brain injury. UDS positive for cocaine. She had lower lobe consolidation and concerns for cavitary pneumonia. Extubated on 6/23. Continues to have cognitive and physical deficits. Minimally verbal. Requires feeding. Currently admitted to CIR. Palliative medicine consulted for GOC.    Today's Discussion 02/05/2023  *Please note that this is a verbal dictation therefore any spelling or grammatical errors are due to the "[employer]" system interpretation.  Chart reviewed inclusive of vital signs, progress notes, laboratory results, and diagnostic images.   I met with Camari at bedside this morning. She is able to respond in two word responses. She states no when asked if she is in pain, short or breath or nauseated.   I called patients mother, [name] this afternoon. I created space and opportunity for [name] to explore thoughts feelings and fears regarding current medical situation. She shares that she has seen Crusita make great strides since being in the hospital. She and the MSW team are working in Health visitor. She expresses that Mykaylah has not suffered tremendous symptom burden though does and likely will have ongoing limitations with mobility.  [name] also expresses that she herself suffered a fainting episode recently. She has since been hospitalized and discharged.  Questions and concerns addressed/Palliative Support Provided.   Objective Assessment: Vital Signs Vitals:   02/04/23 1949 02/05/23 0501  BP: (!) 140/86 (!) 143/95  Pulse: 95 93  Resp: 18 18  Temp: 97.6 F (36.4 C) 97.7 F (36.5 C)  SpO2: 98% 98%    Intake/Output Summary (Last 24 hours) at 02/05/2023 1301 Last data filed at  02/05/2023 1250 Gross per 24 hour  Intake 1080 ml  Output --  Net 1080 ml   Last Weight  Most recent update: 01/27/2023  6:08 PM    Weight  48 kg (105 lb 13.1 oz)            Gen:  Middle aged Caucasian F in NAD HEENT: moist mucous membranes CV: Regular rate and rhythm  PULM: ON RA,  breathing is even and nonlabored ABD: soft/nontender  EXT: No edema Neuro: Alert and oriented   SUMMARY OF RECOMMENDATIONS   DNAR/DNI  Continue current care  Plan for long term placement - medicaid pending  Ongoing incremental PMT support  Billing based on MDM: Moderate ______________________________________________________________________________________ Lamarr Lulas [place] Palliative Medicine Team Team Cell Phone: [phone] Please utilize secure chat with additional questions, if there is no response within 30 minutes please call the above phone number  Palliative Medicine Team providers are available by phone from 7am to 7pm daily and can be reached through the team cell phone.  Should this patient require assistance outside of these hours, please call the patient's attending physician.

## 2023-02-05 NOTE — Progress Notes (Signed)
Occupational Therapy Session Note  Patient Details  Name: Nicole [name] MRN: [id number] Date of Birth: [birth year]  Today's Date: 02/05/2023 OT Individual Time: 0900-0930 OT Individual Time Calculation (min): 30 min    Short Term Goals: Week 11:  OT Short Term Goal 1 (Week 11): Patient will reach toward and grasp needed familiar functional item x 3 in one session  OT Short Term Goal 2 (Week 11): Patient will demonstrate 75% digit extension in left hand following manual stretch   Skilled Therapeutic Interventions/Progress Updates:    Patient received fully dressed and up in wheelchair.  Patient with eyes closed - but did respond verbally to greeting.  Patient transported to gym and worked in parallel bars on components of sit to sit tall, sit to stand.  Patient able to sustain grasp with R hand on bar, and rest left elbow on bar to promote trunk extension.  Patient reports discomfort with any position change.  Patient's brief was noted to be soaked, so transported back to room to change into clean clothing.  Patient able to assist with stabilizing right hand on bedrail to hold sidelying to allow cleaning and clothing management.  Left hand splint applied without any report of discomfort today! Made slight modification to splint to encourage more digit extension.  Left in bed for brief time until PT session.  NT in room.   Therapy Documentation Precautions:  Precautions Precautions: Fall Precaution Comments: Contact precautions & delayed processing Required Braces or Orthoses: Other Brace Other Brace: B adjustable night splints; B elbow "cosey" splints; L palm protector; R mitt Restrictions Weight Bearing Restrictions: No   Pain:  None indicated when asked      Therapy/Group: Individual Therapy  [name] 02/05/2023, 12:45 PM

## 2023-02-05 NOTE — Progress Notes (Signed)
Patient ID: Nicole [name], female   DOB: [birth year], 47 y.o.   MRN: [id number]  Updated FL2 done so once long term medicaid can be placed in facility. Mom is out of the hospital and will come see pt today.

## 2023-02-05 NOTE — Progress Notes (Signed)
Speech Language Pathology Daily Session Note  Patient Details  Name: Nicole [name] MRN: [id number] Date of Birth: 09/30/1975  Today's Date: 02/05/2023 SLP Individual Time: 1345-1430 SLP Individual Time Calculation (min): 45 min  Short Term Goals: Week 9: SLP Short Term Goal 1 (Week 9): Week 11 - Pt will present w/ appropriate response to functional stimuli (i.e. accept spoon, grasp basic item appropriately, response to tactile/auditory stimuli) 50% of the time w/ maxA multimodal cues SLP Short Term Goal 2 (Week 9): Week 11 - Pt will answer open ended questions w/ modA in 50% of opportunities SLP Short Term Goal 3 (Week 9): Week 11 - Pt will answer basic yes/no questions with modA in 60% of opportunities SLP Short Term Goal 4 (Week 9): Week 11 - Pt will utilize increased vocal intensity w/ maxA verbal cues to remain 70% intelligibile at phrase level  Skilled Therapeutic Interventions:  Pt was seen in PM to address communication. Pt was alert and seen at bedside. Her mother arrived during session and participated intermittently throughout. Pt answered open ended biographical questions in 35% opportunities. Those questions were subsequently transitioned to binary options presented verbally and occasionally visually with 54% acc. Finally, SLP presented close ended questions, with pt responding  appropriately with 90% acc. Pt with low vocal intensity throughout session, but demo ability to amplify voice and repeat in multiple opportunities when requested. Pt was ~80% intelligible answering yes/ no questions and 50-60% intelligible in phrases with mod to max A. Pt left at bedside with call button within reach, bed alarm active, and mother at bedside. SLP to continue POC.   Pain Pain Assessment Pain Scale: 0-10 Pain Score: 0-No pain  Therapy/Group: Individual Therapy  [name] 02/05/2023, 3:50 PM

## 2023-02-05 NOTE — Progress Notes (Signed)
PROGRESS NOTE   Subjective/Complaints: Patient's chart reviewed- No issues reported overnight Vitals signs stable  Had BM yesterday  ROS: denies pain- otherwise limited due to cognition, no constipation as per chart  Objective:   No results found. No results for input(s): "WBC", "HGB", "HCT", "PLT" in the last 72 hours.    No results for input(s): "NA", "K", "CL", "CO2", "GLUCOSE", "BUN", "CREATININE", "CALCIUM" in the last 72 hours.        Intake/Output Summary (Last 24 hours) at 02/05/2023 1023 Last data filed at 02/05/2023 0816 Gross per 24 hour  Intake 1077 ml  Output --  Net 1077 ml      Pressure Injury 10/31/22 Buttocks Left Unstageable - Full thickness tissue loss in which the base of the injury is covered by slough (yellow, tan, gray, green or brown) and/or eschar (tan, brown or black) in the wound bed. (Active)  10/31/22 2000  Location: Buttocks  Location Orientation: Left  Staging: Unstageable - Full thickness tissue loss in which the base of the injury is covered by slough (yellow, tan, gray, green or brown) and/or eschar (tan, brown or black) in the wound bed.  Wound Description (Comments):   Present on Admission: Yes     Pressure Injury 11/18/22 Toe (Comment  which one) Anterior;Right Deep Tissue Pressure Injury - Purple or maroon localized area of discolored intact skin or blood-filled blister due to damage of underlying soft tissue from pressure and/or shear. small are (Active)  11/18/22 1300  Location: Toe (Comment  which one)  Location Orientation: Anterior;Right  Staging: Deep Tissue Pressure Injury - Purple or maroon localized area of discolored intact skin or blood-filled blister due to damage of underlying soft tissue from pressure and/or shear.  Wound Description (Comments): small area on right great toe metatarsal  Present on Admission: Yes    Physical Exam: Vital Signs Blood pressure  (!) 143/95, pulse 93, temperature 97.7 F (36.5 C), temperature source Oral, resp. rate 18, height 5\' 1"  (1.549 m), weight 48 kg, SpO2 98%. Gen: no distress, normal appearing HEENT: oral mucosa pink and moist, NCAT Cardio: Reg rate Chest: normal effort, normal rate of breathing Abd: soft, non-distended Ext: no edema Psych: pleasant, normal affect Psychiatric: flat Neurological:oriented to self  Skin: + Scaling dried skin on head present-2 small excoriations, sore on the base of the 5th metatarsal head, rash on face . stage 2 to right toe, area of redness between left thumb and index finger-covered in dressing, left buttock unstageable area not viewed +redness to bilateral elbows  Neurologic: Minimal engagement on exam, tired. Answers "good morning"  Does not follow simple commands. Delayed processing  Tone:  MAS 3 right elbow,MAS 1 in RIght finger and wrist flexors  MAS 3 knee flexors, and MAS 3 L>R  hip adductors  MAS 3 L elbow , MAS 3-4 in L wrist and finger flexors  + LUE WHO  Motor 0/5 strength in LUE and BLE, some purposeful movement in RUE ; reflexively grasps hand Improved cervical extension  Withdraws to pain in bilateral lower and right upper extremities. Musculoskeletal: reduced bilateral shoulder and elbow ROM due to increased tone  Right wrist swelling -Ace wrapped.  Assessment/Plan: 1. Functional deficits which require 3+ hours per day of interdisciplinary therapy in a comprehensive inpatient rehab setting. Physiatrist is providing close team supervision and 24 hour management of active medical problems listed below. Physiatrist and rehab team continue to assess barriers to discharge/monitor patient progress toward functional and medical goals  Care Tool:  Bathing    Body parts bathed by patient: Face, Abdomen, Chest   Body parts bathed by helper: Right arm, Left arm, Front perineal area, Buttocks, Right upper leg, Left upper leg, Right lower leg, Left lower  leg Body parts n/a: Right arm, Left arm, Front perineal area, Buttocks, Right upper leg, Left upper leg, Right lower leg, Left lower leg   Bathing assist Assist Level: Maximal Assistance - Patient 24 - 49%     Upper Body Dressing/Undressing Upper body dressing   What is the patient wearing?: Pull over shirt    Upper body assist Assist Level: Total Assistance - Patient < 25%    Lower Body Dressing/Undressing Lower body dressing      What is the patient wearing?: Pants, Incontinence brief     Lower body assist Assist for lower body dressing: Dependent - Patient 0%     Toileting Toileting Toileting Activity did not occur (Clothing management and hygiene only): N/A (no void or bm)  Toileting assist Assist for toileting: Total Assistance - Patient < 25%     Transfers Chair/bed transfer  Transfers assist  Chair/bed transfer activity did not occur: Safety/medical concerns  Chair/bed transfer assist level: Dependent - Patient 0%     Locomotion Ambulation   Ambulation assist   Ambulation activity did not occur: Safety/medical concerns  Assist level: 2 helpers   Max distance: 34ft   Walk 10 feet activity   Assist  Walk 10 feet activity did not occur: Safety/medical concerns  Assist level: 2 helpers     Walk 50 feet activity   Assist Walk 50 feet with 2 turns activity did not occur: Safety/medical concerns         Walk 150 feet activity   Assist Walk 150 feet activity did not occur: Safety/medical concerns         Walk 10 feet on uneven surface  activity   Assist Walk 10 feet on uneven surfaces activity did not occur: Safety/medical concerns         Wheelchair     Assist Is the patient using a wheelchair?: Yes Type of Wheelchair: Manual    Wheelchair assist level: Dependent - Patient 0% Max wheelchair distance: 64ft    Wheelchair 50 feet with 2 turns activity    Assist    Wheelchair 50 feet with 2 turns activity did not  occur: Safety/medical concerns   Assist Level: Dependent - Patient 0%   Wheelchair 150 feet activity     Assist  Wheelchair 150 feet activity did not occur: Safety/medical concerns   Assist Level: Dependent - Patient 0%   Blood pressure (!) 143/95, pulse 93, temperature 97.7 F (36.5 C), temperature source Oral, resp. rate 18, height 5\' 1"  (1.549 m), weight 48 kg, SpO2 98%.  Medical Problem List and Plan: 1. 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  Impaired mobility: continue Lovenox, weekly creatinine ordered             -antiplatelet therapy: N/A 3. Pain Management: Tylenol as needed  4. History of anxiety: discussed with her mom that she feels this was a big risk factor for her accident  5. Neuropsych/cognition: This patient is not capable of making decisions on her own behalf. 6. Left buttock unstageable ulcer.  continue Medihoney to buttock wound daily cover with foam dressing.  Changing dressing every 3 days or as needed soiling   7. Fluids/Electrolytes/Nutrition: Routine in and outs with follow-up chemistries  -Eating 100% most meals with assist  9/22- eating when fed 8.  Cavitary right  lower lobe pneumonia likely aspiration pneumonia/MRSA pneumonia.  Resolved    Latest Ref Rng & Units 01/22/2023    6:36 AM 12/20/2022   10:54 AM 12/11/2022    7:15 AM  CBC  WBC 4.0 - 10.5 K/uL 8.1  5.6  9.0   Hemoglobin 12.0 - 15.0 g/dL 86.5  78.4  69.6   Hematocrit 36.0 - 46.0 % 47.5  42.8  45.1   Platelets 150 - 400 K/uL 274  253  329      9.  History of drug abuse.  Positive cocaine on urine drug screen.  Provide counseling  10.  AKI/hypovolemia and ATN.  resolved    Latest Ref Rng & Units 02/02/2023    5:30 AM 01/26/2023    3:59 PM 01/22/2023    6:36 AM  BMP  Glucose 70 - 99 mg/dL   90   BUN 6 - 20 mg/dL   14   Creatinine 2.95 - 1.00 mg/dL 2.84  1.32  4.40   Sodium 135 - 145 mmol/L   139   Potassium 3.5 - 5.1 mmol/L   3.9   Chloride 98 - 111 mmol/L   104   CO2 22 - 32 mmol/L   26   Calcium 8.9 - 10.3 mg/dL   9.1     11.  Mild transaminitis with rhabdomyolysis.  Both resolved  12.  Hypotension. Resolved, d/c midodrine  9/21- BP running 140s systolic-will con't to monitor   9/22- BP running 120s-140's over 90s- con't to monitor 13. Impaired initiation: continue amantadine 100mg  BID  14. Suboptimal vitamin D: continue 1,000U D3 daily  15. Spasticity:   -discussed serial casting with OT and her mom  - continue baclofen to 20mg  TID and tizanidine to 4mg  TID. Continue wrist, elbow brace, and PRAFOs. Ordered a night splint for LLE  Repeat CMP today given chronic tizanidine use  -9/7 continues to have significant tone despite use of tizanidine and baclofen, may be beneficial consider baclofen pump in outpatient setting if this causes significant difficulties in care  9/8 consider decrease baclofen if dyskinetic movements continue  9/21- Wearing PRAFOs 16. Bowel and bladder incontinence: continue bowel and bladder program   17. Left hand skin maceration: Eucerin ordered, discussed hand split with OT, conitnue  18. History of magnesium deficiency:  magnesium 500mg  started HS     02/05/2023    5:01 AM 02/04/2023    7:49 PM 02/04/2023    1:16 PM  Vitals with BMI  Systolic 143 140 782  Diastolic 95 86 96  Pulse 93 95 94    19. HTN: see above Increase tizanidine to 6mg  TID. Magnesium supplement added HS. Increase propanolol to 10mg  TID,Mild elevation monitor as there has been some variablity May need antihypertensive med or change tizanidine to clonidine  9/21- BP running 140/s90's- as long as doesn't go higher, will not make changes  9/22- stable- con't regimen    02/05/2023    5:01 AM 02/04/2023    7:49 PM 02/04/2023    1:16 PM  Vitals with BMI  Systolic 143 140 956  Diastolic 95 86 96  Pulse 93 95 94     20. Area of redness between left thumb and index finger: continue foam dressing  21. Scalp eczema: selsun blue ordered, continue  -9/7 patient's mother feels like this is causing her to itch, will start some hydrocortisone cream  22. Fatigue: improved, discussed with team may be secondary to anti-spasticity medications, B12 reviewed and is 335, in suboptimal range, will give 1,000U B12 injection 8/7, will order monthly as needed, metanx started, increase to BID, continue  23. Right wrist swelling: XR ordered and shows no acute fracture, shows 2.10mm ulnar variance, brace removed to given patient a break from it. Voltaren gel ordered prn for discomfort, continue, CT ordered and show no acute osseus injury, continue ace wrap  23. Dysphagia: continue D3/thins, d/c ensure as blood pressure severely elevates whenever restarted  24. MRSA nares positive: negative on repeat, precautions d/ced  25. Tachycardic: resolved, increase propanolol to BID.  continueTizanidine increased to 6mg  TID. Resolved, amantadine increased back to 100mg  BID, continue this dose  26.  Dyskinetic movements in face and head Appear to have resolved, EEG reviewed and was negative for seizure.   27. Small sore on the base of 5th metatarsal head: local wound care, continue  28. Rash on  face: resolved, discussed with mother could have been secondary to added sugars in nutritional supplements  29. Sacral pressure injury: air mattress ordered, Placed nursing order to offload q1 hour while awake, continue medihoney, wound care consulted, discussed with nursing that this is improving  30. Cervical extensor weakness: tried decreasing tizanidine but spasticity appears increased and BP elevated so will increase back to 6mg  TID, continue this dose  31. Low protein stores: prosource ordered, continue   LOS: 79 days A FACE TO FACE EVALUATION WAS PERFORMED  Drema Pry Leovanni Bjorkman 02/05/2023, 10:23 AM

## 2023-02-05 NOTE — Progress Notes (Signed)
Physical Therapy Session Note  Patient Details  Name: Nicole [name] MRN: [id number] Date of Birth: July 30, 1975  Today's Date: 02/05/2023 PT Individual Time: 1015-1058 PT Individual Time Calculation (min): 43 min   Short Term Goals: Week 9: PT Short Term Goal 1 (Week 9): Pt will initiate bed mobility with totalA PT Short Term Goal 2 (Week 9): Pt will maintain unsupported sitting balance for 30 seconds without LOB PT Short Term Goal 3 (Week 9): Pt will complete bed<>chair transfers with no more than totalA  Skilled Therapeutic Interventions/Progress Updates:      Pt resting in bed - fully dressed and clean. No signs of pain. Completed supine<>sit and squat pivot transfer into TIS w/c with totalA. Wheeled into day room to prepare for transfer onto mat table to work on prone position. Noted patient to appear more fatigued with eyes closed and difficult to keep them open. BP checked - WNL 113/88. Care team notified via secure chat. Attempted to work on table-top activity to challenge volitional movement, increased stimulation, and gross motor movements. Used hand-over-hand assist to facilitate, played music to help increase alertness. Minimal participation noted due to fatigue. Pt returned to her room, fully reclined in TIS w/c, pillows under elbows, and mit applied to R hand to reduce picking her scalp. All needs met.   Therapy Documentation Precautions:  Precautions Precautions: Fall Precaution Comments: Contact precautions & delayed processing Required Braces or Orthoses: Other Brace Other Brace: B adjustable night splints; B elbow "cosey" splints; L palm protector; R mitt Restrictions Weight Bearing Restrictions: No General:     Therapy/Group: Individual Therapy  Orrin Brigham 02/05/2023, 7:47 AM

## 2023-02-05 NOTE — NC FL2 (Signed)
[place] MEDICAID FL2 LEVEL OF CARE FORM     IDENTIFICATION  Patient Name: Nicole [name] Birthdate: 1975-11-01 Sex: female Admission Date (Current Location): 11/18/2022  [place] and [place] Number:  [address]nes Bast [id number] S Facility and Address:  [facility], [address] [place], [place] [address]      Provider Number: [id number]  Attending Physician Name and Address:  Horton Chin, MD  Relative Name and Phone Number:  Quincy Carnes  [phone]    Current Level of Care: Other (Comment) (rehab) Recommended Level of Care: Skilled Nursing Facility Prior Approval Number:    Date Approved/Denied:   PASRR Number: [id number]  Discharge Plan: SNF    Current Diagnoses: Patient Active Problem List   Diagnosis Date Noted   Essential hypertension 01/29/2023   Pressure injury of skin 01/29/2023   Protein-calorie malnutrition, severe 01/05/2023   Hypoxic brain injury (HCC) 11/18/2022   Cavitary pneumonia 11/05/2022   Substance use 11/05/2022   Acute respiratory failure (HCC) 10/31/2022   Nonallergic rhinitis 08/23/2020   Mild persistent asthma without complication 06/14/2020   Other allergic rhinitis 06/14/2020   Adverse reaction to food, subsequent encounter 06/14/2020   IBS (irritable bowel syndrome) 05/24/2016   Chronic venous insufficiency 05/24/2016   Migraine 05/24/2016   Pulmonary nodules 10/27/2014   Hepatitis C, chronic (HCC)    Depression 10/09/2013   OSA on CPAP - sees Dr. Jenne Campus in ENT 10/09/2013   Allergic rhinitis 05/30/2012   Cough variant asthma 02/08/2012    Orientation RESPIRATION BLADDER Height & Weight     Self  Normal Incontinent Weight: 105 lb 13.1 oz (48 kg) Height:  5\' 1"  (154.9 cm)  BEHAVIORAL SYMPTOMS/MOOD NEUROLOGICAL BOWEL NUTRITION STATUS      Incontinent Diet (Dys 3 thin liquids needs feeding)  AMBULATORY STATUS COMMUNICATION OF NEEDS Skin   Total Care Does not communicate (does say yes/no and  at times communicates) PU Stage and Appropriate Care   PU Stage 2 Dressing: Daily                   Personal Care Assistance Level of Assistance  Total care Bathing Assistance: Maximum assistance Feeding assistance: Limited assistance Dressing Assistance: Maximum assistance Total Care Assistance: Maximum assistance   Functional Limitations Info  Speech Sight Info: Adequate Hearing Info: Adequate Speech Info: Impaired    SPECIAL CARE FACTORS FREQUENCY  Bowel and bladder program, OT (By licensed OT), PT (By licensed PT), Speech therapy     PT Frequency: 2-3 x week OT Frequency: 2-3 x week Bowel and Bladder Program Frequency: Timed tolieting 3-4 hours   Speech Therapy Frequency: 2-3 x week      Contractures Contractures Info: Not present    Additional Factors Info  Code Status, Allergies Code Status Info: DNR Allergies Info: [place] Nuts, [place] Nuts, Ceclor Nickel triptans     Isolation Precautions Info: Off precautions for MRSA     Current Medications (02/05/2023):  This is the current hospital active medication list Current Facility-Administered Medications  Medication Dose Route Frequency Provider Last Rate Last Admin   (feeding supplement) PROSource Plus liquid 30 mL  30 mL Oral BID BM Raulkar, Drema Pry, MD   30 mL at 02/05/23 0947   acetaminophen (TYLENOL) tablet 650 mg  650 mg Oral Q6H PRN Charlton Amor, PA-C   650 mg at 02/04/23 0904   amantadine (SYMMETREL) 50 MG/5ML solution 100 mg  100 mg Oral BID Raulkar, Drema Pry, MD   100 mg at  02/05/23 4010   ascorbic acid (VITAMIN C) tablet 500 mg  500 mg Oral Daily Raulkar, Drema Pry, MD   500 mg at 02/05/23 0943   baclofen (LIORESAL) tablet 20 mg  20 mg Oral TID Horton Chin, MD   20 mg at 02/05/23 2725   camphor-menthol (SARNA) lotion   Topical PRN Horton Chin, MD   Given at 01/28/23 2224   cholecalciferol (VITAMIN D3) 25 MCG (1000 UNIT) tablet 1,000 Units  1,000 Units Oral Daily Horton Chin, MD   1,000 Units at 02/05/23 3664   diclofenac Sodium (VOLTAREN) 1 % topical gel 2 g  2 g Topical QID PRN Horton Chin, MD   2 g at 02/04/23 0904   enoxaparin (LOVENOX) injection 40 mg  40 mg Subcutaneous Q24H AngiulliMcarthur Rossetti, PA-C   40 mg at 02/04/23 1720   gadobutrol (GADAVIST) 1 MMOL/ML injection 5 mL  5 mL Intravenous Once PRN Raulkar, Drema Pry, MD       Gerhardt's butt cream   Topical BID Jacquelynn Cree, PA-C   Given at 02/05/23 4034   hydrocerin (EUCERIN) cream   Topical BID Horton Chin, MD   Given at 02/05/23 7425   l-methylfolate-B6-B12 (METANX) 3-35-2 MG per tablet 1 tablet  1 tablet Oral BID Raulkar, Drema Pry, MD   1 tablet at 02/05/23 0943   leptospermum manuka honey (MEDIHONEY) paste 1 Application  1 Application Topical Daily Charlton Amor, PA-C   1 Application at 02/05/23 0944   magnesium gluconate (MAGONATE) tablet 500 mg  500 mg Oral QHS Raulkar, Drema Pry, MD   500 mg at 02/04/23 2205   multivitamin with minerals tablet 1 tablet  1 tablet Oral Daily Charlton Amor, PA-C   1 tablet at 02/05/23 9563   propranolol (INDERAL) tablet 10 mg  10 mg Oral TID Horton Chin, MD   10 mg at 02/05/23 0943   selenium sulfide (SELSUN) 1 % shampoo   Topical PRN Horton Chin, MD   Given at 01/17/23 2056   tiZANidine (ZANAFLEX) tablet 6 mg  6 mg Oral TID Horton Chin, MD   6 mg at 02/05/23 8756     Discharge Medications: Please see discharge summary for a list of discharge medications.  Relevant Imaging Results:  Relevant Lab Results:   Additional Information SSN: [id number]  Mikeya Tomasetti, Lemar Livings, LCSW

## 2023-02-06 NOTE — Progress Notes (Signed)
Occupational Therapy Session Note  Patient Details  Name: Nicole [name] MRN: [id number] Date of Birth: Aug 18, 1975  Today's Date: 02/06/2023 OT Individual Time: 7846-9629 OT Individual Time Calculation (min): 30 min    Short Term Goals: Week 9: OT Short Term Goal 1 (Week 9): Patient will change orientation of toothbrush/ hairbrush to accomodate task with min assist after set up OT Short Term Goal 2 (Week 9): Patient will spear food x 3 once utensil in hand, then bring to mouth with only contact assistance  Skilled Therapeutic Interventions/Progress Updates:    Pt greeted semi-reclined in bed pulling at her hair seemingly very uncomfortable with her hair Pt agreeable to OT putting hair up in pony tail to get hair out of her face, then pt able to bring wash cloth to face with R hand to wash face and behind ears. Pt seemingly more comfortable after this. Pt noted to be incontinent of urine. Total A for rolling for Total A peri-care and brief change. Pt agreeable to get clothes on and worked on rolling and reaching to assist with clothing management and donning clean shirt at bed level. OT provided gentle stretching ad ROM to  B elbows with very tight flexor tone. Pt left semi-reclined in bed with needs met.   Therapy Documentation Precautions:  Precautions Precautions: Fall Precaution Comments: Contact precautions & delayed processing Required Braces or Orthoses: Other Brace Other Brace: B adjustable night splints; B elbow "cosey" splints; L palm protector; R mitt Restrictions Weight Bearing Restrictions: No Pain: Pt winces in pain with ROM to B UE's. No number given. Rest and repositioned   Therapy/Group: Individual Therapy  Mal Amabile 02/06/2023, 2:38 PM

## 2023-02-06 NOTE — Progress Notes (Signed)
Physical Therapy Session Note  Patient Details  Name: Nicole [name] MRN: [id number] Date of Birth: 06/19/1975  Today's Date: 02/06/2023 PT Individual Time: 1003-1029 PT Individual Time Calculation (min): 26 min   Short Term Goals: Week 9: PT Short Term Goal 1 (Week 9): Pt will initiate bed mobility with totalA PT Short Term Goal 2 (Week 9): Pt will maintain unsupported sitting balance for 30 seconds without LOB PT Short Term Goal 3 (Week 9): Pt will complete bed<>chair transfers with no more than totalA  Skilled Therapeutic Interventions/Progress Updates:      Pt in bed to start. Focused session on stretching as patient positioned flexed posture. Stretched her into extension and abduction for BLE including hip rotators, hip abd. Provided gentle PROM for hip joint as well. Heel cords stretched with knee flexed/extended. Pt grimacing with all passive stretching and movement. She does appear more alert compared to yesterday. Pt left in bed with HOB near flat to keep passive stretch, pt boosted to [facility] with +2 assist. All needs met.   Therapy Documentation Precautions:  Precautions Precautions: Fall Precaution Comments: Contact precautions & delayed processing Required Braces or Orthoses: Other Brace Other Brace: B adjustable night splints; B elbow "cosey" splints; L palm protector; R mitt Restrictions Weight Bearing Restrictions: No General:     Therapy/Group: Individual Therapy  Dickson Kostelnik P Ida Uppal PT 02/06/2023, 7:47 AM

## 2023-02-06 NOTE — Progress Notes (Signed)
Speech Language Pathology Daily Session Note  Patient Details  Name: Nicole [name] MRN: [id number] Date of Birth: 02-May-1976  Today's Date: 02/06/2023 SLP Individual Time: 7253-6644 SLP Individual Time Calculation (min): 45 min  Short Term Goals: Week 9: SLP Short Term Goal 1 (Week 9): Week 11 - Pt will present w/ appropriate response to functional stimuli (i.e. accept spoon, grasp basic item appropriately, response to tactile/auditory stimuli) 50% of the time w/ maxA multimodal cues SLP Short Term Goal 2 (Week 9): Week 11 - Pt will answer open ended questions w/ modA in 50% of opportunities SLP Short Term Goal 3 (Week 9): Week 11 - Pt will answer basic yes/no questions with modA in 60% of opportunities SLP Short Term Goal 4 (Week 9): Week 11 - Pt will utilize increased vocal intensity w/ maxA verbal cues to remain 70% intelligibile at phrase level  Skilled Therapeutic Interventions:  Pt was seen in am to address communication. Pt was alert and seen in bed. SLP addressed answering yes/no questions, increasing vocal intensity, and answering open ended questions. Given basic and functional items presented verbally, pt named objects in 56% of opportunities. In missed opportunities, SLP provided model of correct response. SLP challenged pt in increasing length of utterance through challenging pt to verbalize how to use aforementioned objects. Pt answered appropriately in 3 of 4 opportunities with max A. Pt ~70% intelligible in words in known context. Intelligibility reduced to 40% in phrases. Pt with decreased responsiveness to cues for amplification this session despite max A. She answered basic yes/ no questions in 60% opportunities with mod to max A. She answered open ended biographical questions in 40% opportunities with mod to max A. Pt was left at bedside with call button within reach and bed alarm active. SLP to continue POC.   Pain Pain Assessment Pain Scale: 0-10 Pain Score: 2  Pain Type:  Acute pain Pain Location: Neck Pain Orientation: Right Pain Descriptors / Indicators: Grimacing Pain Intervention(s): Distraction;Repositioned  Therapy/Group: Individual Therapy  [name] 02/06/2023, 9:41 AM

## 2023-02-06 NOTE — Progress Notes (Signed)
PROGRESS NOTE   Subjective/Complaints: Tolerating therapy well today with increased alertness Labs from yesterday reviewed and are stable  ROS: Denies pain- otherwise limited due to cognition, no constipation as per chart  Objective:   No results found. Recent Labs    02/05/23 1112  WBC 8.5  HGB 15.5*  HCT 46.7*  PLT 284      Recent Labs    02/05/23 1112  NA 138  K 4.0  CL 103  CO2 28  GLUCOSE 111*  BUN 13  CREATININE 0.52  CALCIUM 9.0          Intake/Output Summary (Last 24 hours) at 02/06/2023 1038 Last data filed at 02/06/2023 0749 Gross per 24 hour  Intake 698 ml  Output --  Net 698 ml      Pressure Injury 10/31/22 Buttocks Left Unstageable - Full thickness tissue loss in which the base of the injury is covered by slough (yellow, tan, gray, green or brown) and/or eschar (tan, brown or black) in the wound bed. (Active)  10/31/22 2000  Location: Buttocks  Location Orientation: Left  Staging: Unstageable - Full thickness tissue loss in which the base of the injury is covered by slough (yellow, tan, gray, green or brown) and/or eschar (tan, brown or black) in the wound bed.  Wound Description (Comments):   Present on Admission: Yes     Pressure Injury 11/18/22 Toe (Comment  which one) Anterior;Right Deep Tissue Pressure Injury - Purple or maroon localized area of discolored intact skin or blood-filled blister due to damage of underlying soft tissue from pressure and/or shear. small are (Active)  11/18/22 1300  Location: Toe (Comment  which one)  Location Orientation: Anterior;Right  Staging: Deep Tissue Pressure Injury - Purple or maroon localized area of discolored intact skin or blood-filled blister due to damage of underlying soft tissue from pressure and/or shear.  Wound Description (Comments): small area on right great toe metatarsal  Present on Admission: Yes    Physical Exam: Vital  Signs Blood pressure (!) 133/94, pulse 95, temperature 98.3 F (36.8 C), temperature source Oral, resp. rate 17, height 5\' 1"  (1.549 m), weight 48 kg, SpO2 99%. Gen: no distress, normal appearing HEENT: oral mucosa pink and moist, NCAT Cardio: Reg rate Chest: normal effort, normal rate of breathing Abd: soft, non-distended Ext: no edema Psych: pleasant, normal affect Psychiatric: flat Neurological:oriented to self  Skin: + Scaling dried skin on head present-2 small excoriations, sore on the base of the 5th metatarsal head, rash on face . stage 2 to right toe, area of redness between left thumb and index finger-covered in dressing, left buttock unstageable area not viewed +redness to bilateral elbows  Neurologic: Minimal engagement on exam, tired. Answers "good morning"  Does not follow simple commands. Delayed processing  Tone:  MAS 3 right elbow,MAS 1 in RIght finger and wrist flexors  MAS 3 knee flexors, and MAS 3 L>R  hip adductors  MAS 3 L elbow , MAS 3-4 in L wrist and finger flexors  + LUE WHO  Motor 0/5 strength in LUE and BLE, some purposeful movement in RUE ; reflexively grasps hand Improved cervical extension  Withdraws to pain in  bilateral lower and right upper extremities. Musculoskeletal: reduced bilateral shoulder and elbow ROM due to increased tone  Right wrist swelling -Ace wrapped.   Assessment/Plan: 1. Functional deficits which require 3+ hours per day of interdisciplinary therapy in a comprehensive inpatient rehab setting. Physiatrist is providing close team supervision and 24 hour management of active medical problems listed below. Physiatrist and rehab team continue to assess barriers to discharge/monitor patient progress toward functional and medical goals  Care Tool:  Bathing    Body parts bathed by patient: Face, Abdomen, Chest   Body parts bathed by helper: Right arm, Left arm, Front perineal area, Buttocks, Right upper leg, Left upper leg, Right  lower leg, Left lower leg Body parts n/a: Right arm, Left arm, Front perineal area, Buttocks, Right upper leg, Left upper leg, Right lower leg, Left lower leg   Bathing assist Assist Level: Maximal Assistance - Patient 24 - 49%     Upper Body Dressing/Undressing Upper body dressing   What is the patient wearing?: Pull over shirt    Upper body assist Assist Level: Total Assistance - Patient < 25%    Lower Body Dressing/Undressing Lower body dressing      What is the patient wearing?: Pants, Incontinence brief     Lower body assist Assist for lower body dressing: Dependent - Patient 0%     Toileting Toileting Toileting Activity did not occur (Clothing management and hygiene only): N/A (no void or bm)  Toileting assist Assist for toileting: Total Assistance - Patient < 25%     Transfers Chair/bed transfer  Transfers assist  Chair/bed transfer activity did not occur: Safety/medical concerns  Chair/bed transfer assist level: Dependent - Patient 0%     Locomotion Ambulation   Ambulation assist   Ambulation activity did not occur: Safety/medical concerns  Assist level: 2 helpers   Max distance: 93ft   Walk 10 feet activity   Assist  Walk 10 feet activity did not occur: Safety/medical concerns  Assist level: 2 helpers     Walk 50 feet activity   Assist Walk 50 feet with 2 turns activity did not occur: Safety/medical concerns         Walk 150 feet activity   Assist Walk 150 feet activity did not occur: Safety/medical concerns         Walk 10 feet on uneven surface  activity   Assist Walk 10 feet on uneven surfaces activity did not occur: Safety/medical concerns         Wheelchair     Assist Is the patient using a wheelchair?: Yes Type of Wheelchair: Manual    Wheelchair assist level: Dependent - Patient 0% Max wheelchair distance: 28ft    Wheelchair 50 feet with 2 turns activity    Assist    Wheelchair 50 feet with 2  turns activity did not occur: Safety/medical concerns   Assist Level: Dependent - Patient 0%   Wheelchair 150 feet activity     Assist  Wheelchair 150 feet activity did not occur: Safety/medical concerns   Assist Level: Dependent - Patient 0%   Blood pressure (!) 133/94, pulse 95, temperature 98.3 F (36.8 C), temperature source Oral, resp. rate 17, height 5\' 1"  (1.549 m), weight 48 kg, SpO2 99%.  Medical Problem List and Plan: 1. 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  Impaired mobility: continue Lovenox, weekly creatinine ordered             -antiplatelet therapy: N/A 3. Pain Management: Tylenol as needed  4. History of anxiety: discussed with her mom that she feels this was a big risk factor for her accident  5. Neuropsych/cognition: This patient is not capable of making decisions on her own behalf. 6. Left buttock unstageable ulcer.  continue Medihoney to buttock wound daily cover with foam dressing.  Changing dressing every 3 days or as needed soiling   7. Fluids/Electrolytes/Nutrition: Routine in and outs with follow-up chemistries  -Eating 100% most meals with assist  9/22- eating when  fed 8.  Cavitary right lower lobe pneumonia likely aspiration pneumonia/MRSA pneumonia.  Resolved    Latest Ref Rng & Units 02/05/2023   11:12 AM 01/22/2023    6:36 AM 12/20/2022   10:54 AM  CBC  WBC 4.0 - 10.5 K/uL 8.5  8.1  5.6   Hemoglobin 12.0 - 15.0 g/dL 60.6  30.1  60.1   Hematocrit 36.0 - 46.0 % 46.7  47.5  42.8   Platelets 150 - 400 K/uL 284  274  253      9.  History of drug abuse.  Positive cocaine on urine drug screen.  Provide counseling  10.  AKI/hypovolemia and ATN.  resolved    Latest Ref Rng & Units 02/05/2023   11:12 AM 02/02/2023    5:30 AM 01/26/2023    3:59 PM  BMP  Glucose 70 - 99 mg/dL 093     BUN 6 - 20 mg/dL 13     Creatinine 2.35 - 1.00 mg/dL 5.73  2.20  2.54   Sodium 135 - 145 mmol/L 138     Potassium 3.5 - 5.1 mmol/L 4.0     Chloride 98 - 111 mmol/L 103     CO2 22 - 32 mmol/L 28     Calcium 8.9 - 10.3 mg/dL 9.0       11.  Mild transaminitis with rhabdomyolysis.  Both resolved  12.  Hypotension. Resolved, d/c midodrine  9/21- BP running 140s systolic-will con't to monitor   9/22- BP running 120s-140's over 90s- con't to monitor 13. Impaired initiation: continue amantadine 100mg  BID  14. Suboptimal vitamin D: continue 1,000U D3 daily  15. Spasticity:   -discussed serial casting with OT and her mom  - continue baclofen to 20mg  TID and tizanidine to 4mg  TID. Continue wrist, elbow brace, and PRAFOs. Ordered a night splint for LLE  Repeat CMP today given chronic tizanidine use  -9/7 continues to have significant tone despite use of tizanidine and baclofen, may be beneficial consider baclofen pump in outpatient setting if this causes significant difficulties in care  9/8 consider decrease baclofen if dyskinetic movements continue  9/21- Wearing PRAFOs 16. Bowel and bladder incontinence: continue bowel and bladder program  17. Left hand skin maceration: Eucerin ordered, discussed hand split with OT, conitnue  18. History of magnesium deficiency:  magnesium 500mg  started HS    02/06/2023    6:32 AM 02/05/2023    6:45 PM 02/05/2023    1:22 PM  Vitals with BMI  Systolic 133 126 643  Diastolic 94 96 80  Pulse 95 91 97    19. HTN: see above Increase tizanidine to 6mg  TID. Magnesium supplement added HS. Increase propanolol to 10mg  TID,Mild elevation monitor as there has been some variablity May need antihypertensive med or change tizanidine to clonidine  9/21- BP running 140/s90's- as long as doesn't go higher, will not make changes  9/22- stable- con't regimen    02/06/2023    6:32 AM 02/05/2023    6:45 PM 02/05/2023    1:22 PM  Vitals with BMI  Systolic 133 126 329  Diastolic 94 96 80  Pulse 95 91 97     20. Area of redness between left thumb and index finger: continue foam dressing  21. Scalp eczema: selsun blue ordered, continue  -9/7 patient's mother feels like this is causing her to itch, will start some hydrocortisone cream  22. Fatigue: improved, discussed with team may be secondary to anti-spasticity medications, B12 reviewed and is 335, in suboptimal range, will give 1,000U B12 injection 8/7, will order monthly as needed, metanx started, increase to BID, continue  23. Right wrist swelling: XR ordered and shows no acute fracture, shows 2.7mm ulnar variance, brace removed to given patient a break from it. Voltaren gel ordered prn for discomfort, continue, CT ordered and show no acute osseus injury, continue ace wrap  23. Dysphagia: continue D3/thins, d/c ensure as blood pressure severely elevates whenever restarted  24. MRSA nares positive: negative on repeat, precautions d/ced  25. Tachycardic: resolved, increase propanolol to BID.  continueTizanidine increased to 6mg  TID. Resolved, amantadine increased back to 100mg  BID, continue this dose  26.  Dyskinetic movements in face and head Appear to have resolved, EEG reviewed and was negative for seizure.   27. Small sore on the base of 5th metatarsal head: local wound  care, continue  28. Rash on face: resolved, discussed with mother could have been secondary to added sugars in nutritional supplements  29. Sacral pressure injury: air mattress ordered, Placed nursing order to offload q1 hour while awake, continue medihoney, wound care consulted, discussed with nursing that this is improving  30. Cervical extensor weakness: tried decreasing tizanidine but spasticity appears increased and BP elevated so will increase back to 6mg  TID, continue this dose  31. Low protein stores: prosource ordered, continue  32. Intermittent lethargy: labs reviewed and are stable   LOS: 80 days A FACE TO FACE EVALUATION WAS PERFORMED  Drema Pry Tabari Volkert 02/06/2023, 10:38 AM

## 2023-02-07 MED ORDER — CLONIDINE HCL 0.1 MG/24HR TD PTWK
0.1000 mg | MEDICATED_PATCH | TRANSDERMAL | Status: DC
  Administered 2023-02-14 – 2023-03-07 (×4): 0.1 mg via TRANSDERMAL
  Filled 2023-02-07 (×5): qty 1

## 2023-02-07 NOTE — Plan of Care (Signed)
Continuing current care plan

## 2023-02-07 NOTE — Progress Notes (Signed)
Occupational Therapy Session Note  Patient Details  Name: ANGALENA SANOR MRN: [id number] Date of Birth: 1976-01-26  Today's Date: 02/07/2023 OT Individual Time: 0935-1000 OT Individual Time Calculation (min): 25 min    Short Term Goals: Week 2:  OT Short Term Goal 1 (Week 2): Patient will reach toward and grasp needed familiar functional item x 3 in one session OT Short Term Goal 1 - Progress (Week 2): Progressing toward goal OT Short Term Goal 2 (Week 2): Patient will demonstrate 75% digit extension in left hand following manual stretch OT Short Term Goal 2 - Progress (Week 2): Met OT Short Term Goal 3 (Week 2): Patient will sit unsupported x 5 seconds in preparation for level surface transfer OT Short Term Goal 3 - Progress (Week 2): Progressing toward goal OT Short Term Goal 4 (Week 2): Patient will demonstrate localized response (looking toward)to auditory stimulus (name being called) 3 of 5 trials from supported position. OT Short Term Goal 4 - Progress (Week 2): Progressing toward goal OT Short Term Goal 5 (Week 2): Patient will demonstrate improved passive or active assisted elbow range of motion in preparation for assistance with BADL.  Right -5* extension, Left - 15* extension OT Short Term Goal 5 - Progress (Week 2): Progressing toward goal  Skilled Therapeutic Interventions/Progress Updates:    Patient received supine in bed - still in pajamas.  Assisted patient to roll left/right explaining each step of process to hopefully allow patient to relax and allow movement.  Patient able to maintain grasp on bed rail on left with right hand to allow clothing management over hips.  Patient transferred to wheelchair with max assist.  Transported to sink for grooming.  Handed patient toothbrush with toothpaste on and patient able to brush right side of mouth with right hand.  Patient needed physical assistance to change orientation of brush for left side of mouth.  Patient verbally chose her  shirt today from field of 2.  Patient able to reach down toward right foot with right hand on command  - working to improve hip flexion to aide with sit to stand.   Left up in wheelchair with left hand splint on (no report of pain today!) , lap tray in place and slightly tilted.  Seatbelt alarm engaged and call bell on lap tray.    Therapy Documentation Precautions:  Precautions Precautions: Fall Precaution Comments: Contact precautions & delayed processing Required Braces or Orthoses: Other Brace Other Brace: B adjustable night splints; B elbow "cosey" splints; L palm protector; R mitt Restrictions Weight Bearing Restrictions: No  Pain:  Denies pain at start of session         Therapy/Group: Individual Therapy  [name] 02/07/2023, 12:30 PM

## 2023-02-07 NOTE — Patient Care Conference (Signed)
Inpatient RehabilitationTeam Conference and Plan of Care Update Date: 02/07/2023   Time: 11:02 AM    Patient Name: Nicole [name]      Medical Record Number: [id number]  Date of Birth: 1975-11-08 Sex: Female         Room/Bed: 4W09C/4W09C-01 Payor Info: Payor: [employer] / Plan: [employer] / Product Type: *No Product type* /    Admit Date/Time:  11/18/2022  3:15 PM  Primary Diagnosis:  Hypoxic brain injury [facility])  Hospital Problems: Principal Problem:   Hypoxic brain injury (HCC) Active Problems:   Protein-calorie malnutrition, severe   Essential hypertension   Pressure injury of skin    Expected Discharge Date: Expected Discharge Date:  (SNF pending)  Team Members Present: Physician leading conference: Dr. Sula Soda Social Worker Present: Dossie Der, LCSW Nurse Present: Chana Bode, RN PT Present: Wynelle Link, PT OT Present: Bretta Bang, OT SLP Present: Feliberto Gottron, SLP PPS Coordinator present : Edson Snowball, PT     Current Status/Progress Goal Weekly Team Focus  Bowel/Bladder   incont of B&B LBM 9/23   regain cont of B&B   assist with toileting prn    Swallow/Nutrition/ Hydration               ADL's   dependent to max assist with BADL   max assist   Postural control, range of motion management, participation in functional skills    Mobility   No updates on functional mobility - dependent care.   TotA overall  Bed positioning, stretching, posture and head control    Communication   answers biographical and functional yes/ no questions with questionable accuracy (i.e. denying pain but grimacing, turning red, and flinching). Answers open ended questions with mod to max A. 60% intelligble in words, 40- 50% intelligible in phrases.   mod A   open ended questions, initiating communication, basic commands, automatic communication    Safety/Cognition/ Behavioral Observations  orientation deficits, attention deficits,  recall of biographical and functional information   answer Y/N questions to ID orientation information w/ maxA   sustained attention, orientation, functional task, alertness    Pain   no pain   remain pain free   assess qshift and prn    Skin   pressure wounds with dressing orders in place   wound remain free from infection  assess qshift and prn      Discharge Planning:  Continue to await long term care medicaid approval so can transfer her to a SNF.   Team Discussion: Patient is making steady progress. MD adjusting medication for hypertension.  Patient on target to meet rehab goals: yes, currently continues to be max assist with basic care and has difficulty transitioning right to left for tasks. Answering questions better and awareness has improved along with yes/no accuracy and spontaneous speech.   *See Care Plan and progress notes for long and short-term goals.   Revisions to Treatment Plan:  N/a   Teaching Needs: Safety, medications, skin care/wound care, transfers, toileting, etc.   Current Barriers to Discharge: Decreased caregiver support, Home enviroment access/layout, Incontinence, Wound care, Insurance for SNF coverage, and insurance for follow up services.  Possible Resolutions to Barriers: Family education SNF pending     Medical Summary Current Status: sacral pressure injury, hypertension, protein malnutrition, spasticity, impaired initation  Barriers to Discharge: Medical stability  Barriers to Discharge Comments: sacral pressure injury, hypertension, protein malnutrition, spasticity, impaired initation Possible Resolutions to [employer] Focus: continue medihoney, add clonidine patch  today, continue prosource, continue tizanidine and baclofen, continue amantadine   Continued Need for Acute Rehabilitation Level of Care: The patient requires daily medical management by a physician with specialized training in physical medicine and rehabilitation  for the following reasons: Direction of a multidisciplinary physical rehabilitation program to maximize functional independence : Yes Medical management of patient stability for increased activity during participation in an intensive rehabilitation regime.: Yes Analysis of laboratory values and/or radiology reports with any subsequent need for medication adjustment and/or medical intervention. : Yes   I attest that I was present, lead the team conference, and concur with the assessment and plan of the team.   Chana Bode B 02/07/2023, 4:51 PM

## 2023-02-07 NOTE — Progress Notes (Signed)
Patient ID: Nicole [name], female   DOB: 1975/09/08, 47 y.o.   MRN: [id number]  Continue to await her long term medicaid so she can be placed in a facility. Mom is out of the hospital and doing better. Mom aware of team conference update regarding more verbal and following some commands.

## 2023-02-07 NOTE — Plan of Care (Signed)
Continue current care plan 

## 2023-02-07 NOTE — Progress Notes (Signed)
Physical Therapy Session Note  Patient Details  Name: Nicole [name] MRN: [id number] Date of Birth: Oct 01, 1975  Today's Date: 02/07/2023 PT Individual Time: 1130-1204 PT Individual Time Calculation (min): 34 min   Short Term Goals: Week 9: PT Short Term Goal 1 (Week 9): Pt will initiate bed mobility with totalA PT Short Term Goal 2 (Week 9): Pt will maintain unsupported sitting balance for 30 seconds without LOB PT Short Term Goal 3 (Week 9): Pt will complete bed<>chair transfers with no more than totalA  Skilled Therapeutic Interventions/Progress Updates:      Pt received sitting in TIS w/c - awake and appearing agreeable to therapy treatment. Transported to day room rehab gym. Dependent squat pivot transfer onto mat table. +2 assist for patient positioning into prone position on mat table. Pt benefiting from slow transitions to help with understanding of directions and task. Pt positioned in prone and tolerated for >15 minutes. Worked on stretching hip flexors and rotators, as well as positioning B arms in external rotation and abduction. Used a rolled sheet under her forehead for positioning into extension. Pt returned to her w/c in similar manner and then back to bed per nursing request. All needs met at end of treatment.   Therapy Documentation Precautions:  Precautions Precautions: Fall Precaution Comments: Contact precautions & delayed processing Required Braces or Orthoses: Other Brace Other Brace: B adjustable night splints; B elbow "cosey" splints; L palm protector; R mitt Restrictions Weight Bearing Restrictions: No General:      Therapy/Group: Individual Therapy  Nicole [name] 02/07/2023, 7:59 AM

## 2023-02-07 NOTE — Progress Notes (Signed)
   Palliative Medicine Inpatient Follow Up Note HPI: 47 y.o. female  with past medical history of asthma, anxiety/depression, drug abuse. Admitted on 11/18/2022 after being found down, unresponsive for unknown time. Required intubation. CT and MRI indicated concerns for anoxic vs metabolic brain injury. UDS positive for cocaine. She had lower lobe consolidation and concerns for cavitary pneumonia. Extubated on 6/23. Continues to have cognitive and physical deficits. Minimally verbal. Requires feeding. Currently admitted to CIR. Palliative medicine consulted for GOC.    Today's Discussion 02/07/2023  *Please note that this is a verbal dictation therefore any spelling or grammatical errors are due to the "[employer]agon [employer] One" system interpretation.  Chart reviewed inclusive of vital signs, progress notes, laboratory results, and diagnostic images.   I met with Nicole [name] at bedside this morning. She is awake and aware of self. She speaks a few words. She denies pain, nausea, or dyspnea. She appears to be non distressed.   At this point plan for ongoing rehabilitation and long term placement thereafter.   Palliative Support Provided.   Objective Assessment: Vital Signs Vitals:   02/06/23 2035 02/07/23 0602  BP: (!) 161/112 (!) 144/102  Pulse: (!) 101 84  Resp: 18 18  Temp: 99 F (37.2 C) (!) 97.4 F (36.3 C)  SpO2: 97% 100%    Intake/Output Summary (Last 24 hours) at 02/07/2023 0728 Last data filed at 02/06/2023 1817 Gross per 24 hour  Intake 574 ml  Output --  Net 574 ml   Last Weight  Most recent update: 01/27/2023  6:08 PM    Weight  48 kg (105 lb 13.1 oz)            Gen:  Middle aged Caucasian F in NAD HEENT: moist mucous membranes CV: Regular rate and rhythm  PULM: ON RA,  breathing is even and nonlabored ABD: soft/nontender  EXT: No edema Neuro: Alert and oriented   SUMMARY OF RECOMMENDATIONS   DNAR/DNI  Continue current care  Plan for long term placement --> medicaid  pending  PMT will sign off given clarity of goals (have let Dr. Carlis Abbott know). Please do not hesitate to contact the team if additional support is needed.   Billing based on MDM: Low ______________________________________________________________________________________ Lamarr Lulas [facility] Team Team Cell Phone: [phone] Please utilize secure chat with additional questions, if there is no response within 30 minutes please call the above phone number  Palliative Medicine Team providers are available by phone from 7am to 7pm daily and can be reached through the team cell phone.  Should this patient require assistance outside of these hours, please call the patient's attending physician.

## 2023-02-07 NOTE — Progress Notes (Signed)
PROGRESS NOTE   Subjective/Complaints: Sleepy this morning Patient's chart reviewed- No issues reported overnight BP elevated, will add clonidine patch  ROS: Limited by cognition  Objective:   No results found. Recent Labs    02/05/23 1112  WBC 8.5  HGB 15.5*  HCT 46.7*  PLT 284      Recent Labs    02/05/23 1112  NA 138  K 4.0  CL 103  CO2 28  GLUCOSE 111*  BUN 13  CREATININE 0.52  CALCIUM 9.0          Intake/Output Summary (Last 24 hours) at 02/07/2023 1018 Last data filed at 02/07/2023 0815 Gross per 24 hour  Intake 574 ml  Output --  Net 574 ml      Pressure Injury 10/31/22 Buttocks Left Unstageable - Full thickness tissue loss in which the base of the injury is covered by slough (yellow, tan, gray, green or brown) and/or eschar (tan, brown or black) in the wound bed. (Active)  10/31/22 2000  Location: Buttocks  Location Orientation: Left  Staging: Unstageable - Full thickness tissue loss in which the base of the injury is covered by slough (yellow, tan, gray, green or brown) and/or eschar (tan, brown or black) in the wound bed.  Wound Description (Comments):   Present on Admission: Yes     Pressure Injury 11/18/22 Toe (Comment  which one) Anterior;Right Deep Tissue Pressure Injury - Purple or maroon localized area of discolored intact skin or blood-filled blister due to damage of underlying soft tissue from pressure and/or shear. small are (Active)  11/18/22 1300  Location: Toe (Comment  which one)  Location Orientation: Anterior;Right  Staging: Deep Tissue Pressure Injury - Purple or maroon localized area of discolored intact skin or blood-filled blister due to damage of underlying soft tissue from pressure and/or shear.  Wound Description (Comments): small area on right great toe metatarsal  Present on Admission: Yes    Physical Exam: Vital Signs Blood pressure (!) 144/102, pulse 84,  temperature (!) 97.4 F (36.3 C), resp. rate 18, height 5\' 1"  (1.549 m), weight 47.6 kg, SpO2 100%. Gen: no distress, normal appearing HEENT: oral mucosa pink and moist, NCAT Cardio: Reg rate Chest: normal effort, normal rate of breathing Abd: soft, non-distended Ext: no edema Psych: pleasant, normal affect Psychiatric: flat Neurological:oriented to self  Skin: + Scaling dried skin on head present-2 small excoriations, sore on the base of the 5th metatarsal head, rash on face . stage 2 to right toe, area of redness between left thumb and index finger-covered in dressing, left buttock unstageable area not viewed +redness to bilateral elbows  Neurologic: Minimal engagement on exam, tired. Answers "good morning"  Does not follow simple commands. Delayed processing  Tone:  MAS 3 right elbow,MAS 1 in RIght finger and wrist flexors  MAS 3 knee flexors, and MAS 3 L>R  hip adductors  MAS 3 L elbow , MAS 3-4 in L wrist and finger flexors  + LUE WHO  Motor 0/5 strength in LUE and BLE, some purposeful movement in RUE ; reflexively grasps hand Improved cervical extension  Withdraws to pain in bilateral lower and right upper extremities. Musculoskeletal: reduced bilateral  shoulder and elbow ROM due to increased tone  Right wrist swelling -Ace wrapped.   Assessment/Plan: 1. Functional deficits which require 3+ hours per day of interdisciplinary therapy in a comprehensive inpatient rehab setting. Physiatrist is providing close team supervision and 24 hour management of active medical problems listed below. Physiatrist and rehab team continue to assess barriers to discharge/monitor patient progress toward functional and medical goals  Care Tool:  Bathing    Body parts bathed by patient: Face, Abdomen, Chest   Body parts bathed by helper: Right arm, Left arm, Front perineal area, Buttocks, Right upper leg, Left upper leg, Right lower leg, Left lower leg Body parts n/a: Right arm, Left arm,  Front perineal area, Buttocks, Right upper leg, Left upper leg, Right lower leg, Left lower leg   Bathing assist Assist Level: Maximal Assistance - Patient 24 - 49%     Upper Body Dressing/Undressing Upper body dressing   What is the patient wearing?: Pull over shirt    Upper body assist Assist Level: Total Assistance - Patient < 25%    Lower Body Dressing/Undressing Lower body dressing      What is the patient wearing?: Pants, Incontinence brief     Lower body assist Assist for lower body dressing: Dependent - Patient 0%     Toileting Toileting Toileting Activity did not occur (Clothing management and hygiene only): N/A (no void or bm)  Toileting assist Assist for toileting: Total Assistance - Patient < 25%     Transfers Chair/bed transfer  Transfers assist  Chair/bed transfer activity did not occur: Safety/medical concerns  Chair/bed transfer assist level: Dependent - Patient 0%     Locomotion Ambulation   Ambulation assist   Ambulation activity did not occur: Safety/medical concerns  Assist level: 2 helpers   Max distance: 16ft   Walk 10 feet activity   Assist  Walk 10 feet activity did not occur: Safety/medical concerns  Assist level: 2 helpers     Walk 50 feet activity   Assist Walk 50 feet with 2 turns activity did not occur: Safety/medical concerns         Walk 150 feet activity   Assist Walk 150 feet activity did not occur: Safety/medical concerns         Walk 10 feet on uneven surface  activity   Assist Walk 10 feet on uneven surfaces activity did not occur: Safety/medical concerns         Wheelchair     Assist Is the patient using a wheelchair?: Yes Type of Wheelchair: Manual    Wheelchair assist level: Dependent - Patient 0% Max wheelchair distance: 57ft    Wheelchair 50 feet with 2 turns activity    Assist    Wheelchair 50 feet with 2 turns activity did not occur: Safety/medical concerns   Assist  Level: Dependent - Patient 0%   Wheelchair 150 feet activity     Assist  Wheelchair 150 feet activity did not occur: Safety/medical concerns   Assist Level: Dependent - Patient 0%   Blood pressure (!) 144/102, pulse 84, temperature (!) 97.4 F (36.3 C), resp. rate 18, height 5\' 1"  (1.549 m), weight 47.6 kg, SpO2 100%.  Medical Problem List and Plan: 1. 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  Impaired mobility: continue Lovenox, weekly creatinine ordered             -antiplatelet therapy: N/A 3. Pain Management: Tylenol as needed  4. History of anxiety: discussed with her mom that she feels this was a big risk factor for her accident  5. Neuropsych/cognition: This patient is not capable of making decisions on her own behalf. 6. Left buttock unstageable ulcer.  continue Medihoney to buttock wound daily cover with foam dressing.  Changing dressing every 3 days or as needed soiling   7. Fluids/Electrolytes/Nutrition: Routine in and outs with follow-up chemistries  -Eating 100% most meals with assist  9/22- eating when fed 8.  Cavitary right lower lobe pneumonia likely aspiration pneumonia/MRSA pneumonia.   Resolved    Latest Ref Rng & Units 02/05/2023   11:12 AM 01/22/2023    6:36 AM 12/20/2022   10:54 AM  CBC  WBC 4.0 - 10.5 K/uL 8.5  8.1  5.6   Hemoglobin 12.0 - 15.0 g/dL 16.1  09.6  04.5   Hematocrit 36.0 - 46.0 % 46.7  47.5  42.8   Platelets 150 - 400 K/uL 284  274  253      9.  History of drug abuse.  Positive cocaine on urine drug screen.  Provide counseling  10.  AKI/hypovolemia and ATN.  resolved    Latest Ref Rng & Units 02/05/2023   11:12 AM 02/02/2023    5:30 AM 01/26/2023    3:59 PM  BMP  Glucose 70 - 99 mg/dL 409     BUN 6 - 20 mg/dL 13     Creatinine 8.11 - 1.00 mg/dL 9.14  7.82  9.56   Sodium 135 - 145 mmol/L 138     Potassium 3.5 - 5.1 mmol/L 4.0     Chloride 98 - 111 mmol/L 103     CO2 22 - 32 mmol/L 28     Calcium 8.9 - 10.3 mg/dL 9.0       11.  Mild transaminitis with rhabdomyolysis.  Both resolved  12.  Hypotension. Resolved, d/c midodrine  9/21- BP running 140s systolic-will con't to monitor   9/22- BP running 120s-140's over 90s- con't to monitor 13. Impaired initiation: continue amantadine 100mg  BID  14. Suboptimal vitamin D: continue 1,000U D3 daily  15. Spasticity:   -discussed serial casting with OT and her mom  - continue baclofen to 20mg  TID and tizanidine to 4mg  TID. Continue wrist, elbow brace, and PRAFOs. Ordered a night splint for LLE  Repeat CMP today given chronic tizanidine use  -9/7 continues to have significant tone despite use of tizanidine and baclofen, may be beneficial consider baclofen pump in outpatient setting if this causes significant difficulties in care  9/8 consider decrease baclofen if dyskinetic movements continue  9/21- Wearing PRAFOs 16. Bowel and bladder incontinence: continue bowel and bladder program   17.  Left hand skin maceration: Eucerin ordered, discussed hand split with OT, conitnue  18. History of magnesium deficiency: magnesium 500mg  started HS    02/07/2023    8:00 AM 02/07/2023    6:02 AM 02/06/2023    8:35  PM  Vitals with BMI  Weight 104 lbs 15 oz    Systolic  144 161  Diastolic  657 112  Pulse  84 101    19. HTN: see above Increase tizanidine to 6mg  TID. Magnesium supplement added HS. Increase propanolol to 10mg  TID, Clonidine patch added     02/07/2023    8:00 AM 02/07/2023    6:02 AM 02/06/2023    8:35 PM  Vitals with BMI  Weight 104 lbs 15 oz    Systolic  144 161  Diastolic  846 112  Pulse  84 101     20. Area of redness between left thumb and index finger: continue foam dressing  21. Scalp eczema: selsun blue ordered, continue  -9/7 patient's mother feels like this is causing her to itch, will start some hydrocortisone cream  22. Fatigue: improved, discussed with team may be secondary to anti-spasticity medications, B12 reviewed and is 335, in suboptimal range, will give 1,000U B12 injection 8/7, will order monthly as needed, metanx started, increase to BID, continue  23. Right wrist swelling: XR ordered and shows no acute fracture, shows 2.52mm ulnar variance, brace removed to given patient a break from it. Voltaren gel ordered prn for discomfort, continue, CT ordered and show no acute osseus injury, continue ace wrap  23. Dysphagia: continue D3/thins, d/c ensure as blood pressure severely elevates whenever restarted  24. MRSA nares positive: negative on repeat, precautions d/ced  25. Tachycardic: resolved, increase propanolol to BID.  continueTizanidine increased to 6mg  TID. Resolved, amantadine increased back to 100mg  BID, continue this dose  26.  Dyskinetic movements in face and head Appear to have resolved, EEG reviewed and was negative for seizure.   27. Small sore on the base of 5th metatarsal head: local wound care, continue  28. Rash on face: resolved, discussed with mother could have been secondary to added sugars in nutritional supplements  29. Sacral pressure injury: air mattress ordered, Placed nursing order to offload q1 hour while awake, continue medihoney,  wound care consulted, discussed with nursing that this is improving  30. Cervical extensor weakness: tried decreasing tizanidine but spasticity appears increased and BP elevated so will increase back to 6mg  TID, continue this dose  31. Low protein stores: prosource ordered, continue  32. Intermittent lethargy: labs reviewed and are stable   LOS: 81 days A FACE TO FACE EVALUATION WAS PERFORMED  Reiss Mowrey P Kesley Gaffey 02/07/2023, 10:18 AM

## 2023-02-08 MED ORDER — PROPRANOLOL HCL 20 MG PO TABS
20.0000 mg | ORAL_TABLET | Freq: Three times a day (TID) | ORAL | Status: DC
  Administered 2023-02-08 – 2023-02-22 (×42): 20 mg via ORAL
  Filled 2023-02-08 (×42): qty 1

## 2023-02-08 NOTE — Progress Notes (Signed)
Late entry: Dr. Carlis Abbott notified of blood blister noted to left heel 02/07/23.   Tilden Dome, LPN

## 2023-02-08 NOTE — Plan of Care (Signed)
Continue current care plan

## 2023-02-08 NOTE — Progress Notes (Signed)
Continuing current care plan.     Tilden Dome, LPN

## 2023-02-08 NOTE — Progress Notes (Signed)
Occupational Therapy Session Note  Patient Details  Name: Nicole [name] MRN: [id number] Date of Birth: 1976/01/05  Today's Date: 02/08/2023 OT Individual Time: 0930-1000 OT Individual Time Calculation (min): 30 min    Short Term Goals: Week 9: OT Short Term Goal 1 (Week 9): Patient will change orientation of toothbrush/ hairbrush to accomodate task with min assist after set up OT Short Term Goal 2 (Week 9): Patient will spear food x 3 once utensil in hand, then bring to mouth with only contact assistance  Skilled Therapeutic Interventions/Progress Updates:    1:1 Pt received in the bed. Pt all flexed in the bed (legs, trunk and neck). Bed positioned in flat position to stretch out. Pt with her hair all over the bed and her shirt - when asked if she had been pulling it she replied: "its my coping strategy." And laughed. Pt assisted with threading pants and attempted to use right hand to pull them from her thigh to hip with [facility] guidance but was engaged in task. Pt came to EOB with total A and transferred with initiation to flex forward in prep for transfer and reported "I need help." Total A transfer into regular chair to doff shirt and don clean one. Pt assisted with doffing from arms (once taken off her head). Pt did require total A to don clean shirt. Transferred into TIS w/c and assisted with position in fully titled position and left resting in the chair. Pt asleep when left.    Therapy Documentation Precautions:  Precautions Precautions: Fall Precaution Comments: Contact precautions & delayed processing Required Braces or Orthoses: Other Brace Other Brace: B adjustable night splints; B elbow "cosey" splints; L palm protector; R mitt Restrictions Weight Bearing Restrictions: No  Pain: Pain Assessment Pain Scale: 0-10 Pain Score: 0-No pain   Therapy/Group: Individual Therapy  Roney [facility] 02/08/2023, 11:59 AM

## 2023-02-08 NOTE — Progress Notes (Signed)
Physical Therapy Session Note  Patient Details  Name: Nicole [name] MRN: [id number] Date of Birth: 07-11-1975  Today's Date: 02/08/2023 PT Individual Time: 1345-1440 PT Individual Time Calculation (min): 55 min   Short Term Goals: Week 9: PT Short Term Goal 1 (Week 9): Pt will initiate bed mobility with totalA PT Short Term Goal 2 (Week 9): Pt will maintain unsupported sitting balance for 30 seconds without LOB PT Short Term Goal 3 (Week 9): Pt will complete bed<>chair transfers with no more than totalA  Skilled Therapeutic Interventions/Progress Updates:      Pt lying in bed to start, sleeping with her head resting against the bed rail. Pt gently woken and agreeable to therapy session.   Supine<>sitting and squat pivot transfer completed with dependent assist. Donned tennis shoes with dependent assist as well.   Transported to day room rehab gym and assisted onto tilt table with +2 dependent lift. Focused majority of session on standing in tilt table for weight bearing and heel cord stretching. Pt able to tolerate full tilt for >15 minutes, could probably go longer but she began to show more trunk/cervical flexion with fatigue. Unable to get her R heel down fully onto the standing pad in standing. Provided gentle terminal knee extension bilaterally in standing, increased pain with PROM on L knee. Also worked a lot on cervical positioning, extension, rotation, lateral flexion - this was also addressed in supine position and suboccipital release and gentle PROM.   Pt assisted back into w/c with +2 lift dependently and then returned to her room. Pt tilted in TIS w/c, full lap tray with pillows, and all needs met. Door left open.    Therapy Documentation Precautions:  Precautions Precautions: Fall Precaution Comments: Contact precautions & delayed processing Required Braces or Orthoses: Other Brace Other Brace: B adjustable night splints; B elbow "cosey" splints; L palm protector; R  mitt Restrictions Weight Bearing Restrictions: No General:      Therapy/Group: Individual Therapy  Lakeasha Petion P Chasitty Hehl PT 02/08/2023, 7:56 AM

## 2023-02-08 NOTE — Progress Notes (Signed)
PROGRESS NOTE   Subjective/Complaints: Sleepy this morning Patient's chart reviewed- No issues reported overnight BP elevated, will add clonidine patch  ROS: Limited by cognition  Objective:   No results found. No results for input(s): "WBC", "HGB", "HCT", "PLT" in the last 72 hours.     No results for input(s): "NA", "K", "CL", "CO2", "GLUCOSE", "BUN", "CREATININE", "CALCIUM" in the last 72 hours.         Intake/Output Summary (Last 24 hours) at 02/08/2023 1241 Last data filed at 02/08/2023 0900 Gross per 24 hour  Intake 590 ml  Output --  Net 590 ml      Pressure Injury 10/31/22 Buttocks Left Unstageable - Full thickness tissue loss in which the base of the injury is covered by slough (yellow, tan, gray, green or brown) and/or eschar (tan, brown or black) in the wound bed. (Active)  10/31/22 2000  Location: Buttocks  Location Orientation: Left  Staging: Unstageable - Full thickness tissue loss in which the base of the injury is covered by slough (yellow, tan, gray, green or brown) and/or eschar (tan, brown or black) in the wound bed.  Wound Description (Comments):   Present on Admission: Yes     Pressure Injury 11/18/22 Toe (Comment  which one) Anterior;Right Deep Tissue Pressure Injury - Purple or maroon localized area of discolored intact skin or blood-filled blister due to damage of underlying soft tissue from pressure and/or shear. small are (Active)  11/18/22 1300  Location: Toe (Comment  which one)  Location Orientation: Anterior;Right  Staging: Deep Tissue Pressure Injury - Purple or maroon localized area of discolored intact skin or blood-filled blister due to damage of underlying soft tissue from pressure and/or shear.  Wound Description (Comments): small area on right great toe metatarsal  Present on Admission: Yes    Physical Exam: Vital Signs Blood pressure (!) 124/95, pulse 86, temperature  98.5 F (36.9 C), resp. rate 18, height 5\' 1"  (1.549 m), weight 47.6 kg, SpO2 100%. Gen: no distress, normal appearing HEENT: oral mucosa pink and moist, NCAT Cardio: Reg rate Chest: normal effort, normal rate of breathing Abd: soft, non-distended Ext: no edema Psych: pleasant, normal affect Psychiatric: flat Neurological:oriented to self  Skin: + Scaling dried skin on head present-2 small excoriations, sore on the base of the 5th metatarsal head, rash on face . stage 2 to right toe, area of redness between left thumb and index finger-covered in dressing, left buttock unstageable area not viewed +redness to bilateral elbows  Neurologic: Minimal engagement on exam, tired. Answers "good morning"  Does not follow simple commands. Delayed processing  Tone:  MAS 3 right elbow,MAS 1 in RIght finger and wrist flexors  MAS 3 knee flexors, and MAS 3 L>R  hip adductors  MAS 3 L elbow , MAS 3-4 in L wrist and finger flexors  + LUE WHO  Motor 0/5 strength in LUE and BLE, some purposeful movement in RUE ; reflexively grasps hand Improved cervical extension  Withdraws to pain in bilateral lower and right upper extremities. Musculoskeletal: reduced bilateral shoulder and elbow ROM due to increased tone  Right wrist swelling -Ace wrapped.   Assessment/Plan: 1. Functional deficits which require 3+  hours per day of interdisciplinary therapy in a comprehensive inpatient rehab setting. Physiatrist is providing close team supervision and 24 hour management of active medical problems listed below. Physiatrist and rehab team continue to assess barriers to discharge/monitor patient progress toward functional and medical goals  Care Tool:  Bathing    Body parts bathed by patient: Face, Abdomen, Chest   Body parts bathed by helper: Right arm, Left arm, Front perineal area, Buttocks, Right upper leg, Left upper leg, Right lower leg, Left lower leg Body parts n/a: Right arm, Left arm, Front perineal  area, Buttocks, Right upper leg, Left upper leg, Right lower leg, Left lower leg   Bathing assist Assist Level: Maximal Assistance - Patient 24 - 49%     Upper Body Dressing/Undressing Upper body dressing   What is the patient wearing?: Pull over shirt    Upper body assist Assist Level: Total Assistance - Patient < 25%    Lower Body Dressing/Undressing Lower body dressing      What is the patient wearing?: Pants, Incontinence brief     Lower body assist Assist for lower body dressing: Dependent - Patient 0%     Toileting Toileting Toileting Activity did not occur (Clothing management and hygiene only): N/A (no void or bm)  Toileting assist Assist for toileting: Total Assistance - Patient < 25%     Transfers Chair/bed transfer  Transfers assist  Chair/bed transfer activity did not occur: Safety/medical concerns  Chair/bed transfer assist level: Dependent - Patient 0%     Locomotion Ambulation   Ambulation assist   Ambulation activity did not occur: Safety/medical concerns  Assist level: 2 helpers   Max distance: 47ft   Walk 10 feet activity   Assist  Walk 10 feet activity did not occur: Safety/medical concerns  Assist level: 2 helpers     Walk 50 feet activity   Assist Walk 50 feet with 2 turns activity did not occur: Safety/medical concerns         Walk 150 feet activity   Assist Walk 150 feet activity did not occur: Safety/medical concerns         Walk 10 feet on uneven surface  activity   Assist Walk 10 feet on uneven surfaces activity did not occur: Safety/medical concerns         Wheelchair     Assist Is the patient using a wheelchair?: Yes Type of Wheelchair: Manual    Wheelchair assist level: Dependent - Patient 0% Max wheelchair distance: 96ft    Wheelchair 50 feet with 2 turns activity    Assist    Wheelchair 50 feet with 2 turns activity did not occur: Safety/medical concerns   Assist Level: Dependent  - Patient 0%   Wheelchair 150 feet activity     Assist  Wheelchair 150 feet activity did not occur: Safety/medical concerns   Assist Level: Dependent - Patient 0%   Blood pressure (!) 124/95, pulse 86, temperature 98.5 F (36.9 C), resp. rate 18, height 5\' 1"  (1.549 m), weight 47.6 kg, SpO2 100%.  Medical Problem List and Plan: 1. 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  Impaired mobility: continue Lovenox, weekly creatinine ordered             -antiplatelet therapy: N/A 3. Pain Management: Tylenol as needed  4. History of anxiety: discussed with her mom that she feels this was a big risk factor for her accident  5. Neuropsych/cognition: This patient is not capable of making decisions on her own behalf. 6. Left buttock unstageable ulcer.  continue Medihoney to buttock wound daily cover with foam dressing.  Changing dressing every 3 days or as needed soiling   7. Fluids/Electrolytes/Nutrition: Routine in and outs with follow-up chemistries  -Eating 100% most meals with assist  9/22- eating when fed 8.  Cavitary right lower lobe pneumonia likely aspiration pneumonia/MRSA pneumonia.  Resolved    Latest  Ref Rng & Units 02/05/2023   11:12 AM 01/22/2023    6:36 AM 12/20/2022   10:54 AM  CBC  WBC 4.0 - 10.5 K/uL 8.5  8.1  5.6   Hemoglobin 12.0 - 15.0 g/dL 16.1  09.6  04.5   Hematocrit 36.0 - 46.0 % 46.7  47.5  42.8   Platelets 150 - 400 K/uL 284  274  253      9.  History of drug abuse.  Positive cocaine on urine drug screen.  Provide counseling  10.  AKI/hypovolemia and ATN.  resolved    Latest Ref Rng & Units 02/05/2023   11:12 AM 02/02/2023    5:30 AM 01/26/2023    3:59 PM  BMP  Glucose 70 - 99 mg/dL 409     BUN 6 - 20 mg/dL 13     Creatinine 8.11 - 1.00 mg/dL 9.14  7.82  9.56   Sodium 135 - 145 mmol/L 138     Potassium 3.5 - 5.1 mmol/L 4.0     Chloride 98 - 111 mmol/L 103     CO2 22 - 32 mmol/L 28     Calcium 8.9 - 10.3 mg/dL 9.0       11.  Mild transaminitis with rhabdomyolysis.  Both resolved  12.  Hypotension. Resolved, d/c midodrine  9/21- BP running 140s systolic-will con't to monitor   9/22- BP running 120s-140's over 90s- con't to monitor 13. Impaired initiation: continue amantadine 100mg  BID  14. Suboptimal vitamin D: continue 1,000U D3 daily  15. Spasticity:   -discussed serial casting with OT and her mom  - continue baclofen to 20mg  TID and tizanidine to 4mg  TID. Continue wrist, elbow brace, and PRAFOs. Ordered a night splint for LLE  Repeat CMP today given chronic tizanidine use  -9/7 continues to have significant tone despite use of tizanidine and baclofen, may be beneficial consider baclofen pump in outpatient setting if this causes significant difficulties in care  9/8 consider decrease baclofen if dyskinetic movements continue  9/21- Wearing PRAFOs 16. Bowel and bladder incontinence: continue bowel and bladder program   17. Left hand skin maceration: Eucerin ordered, discussed hand split with OT, conitnue  18. History of magnesium deficiency: magnesium 500mg  started HS  02/08/2023   10:53 AM 02/08/2023   10:52 AM 02/08/2023    8:34 AM  Vitals with BMI   Systolic 124  145  Diastolic 95  104  Pulse 86 88 99    19. HTN: see above Increase tizanidine to 6mg  TID. Magnesium supplement added HS. Increase propanolol to 20mg  TID     02/08/2023   10:53 AM 02/08/2023   10:52 AM 02/08/2023    8:34 AM  Vitals with BMI  Systolic 124  145  Diastolic 95  104  Pulse 86 88 99     20. Area of redness between left thumb and index finger: continue foam dressing  21. Scalp eczema: selsun blue ordered, continue  -9/7 patient's mother feels like this is causing her to itch, will start some hydrocortisone cream  22. Fatigue: improved, discussed with team may be secondary to anti-spasticity medications, B12 reviewed and is 335, in suboptimal range, will give 1,000U B12 injection 8/7, will order monthly as needed, metanx started, increase to BID, continue  23. Right wrist swelling: XR ordered and shows no acute fracture, shows 2.25mm ulnar variance, brace removed to given patient a break from it. Voltaren gel ordered prn for discomfort, continue, CT ordered and show no acute osseus injury, continue ace wrap  23. Dysphagia: continue D3/thins, d/c ensure as blood pressure severely elevates whenever restarted  24. MRSA nares positive: negative on repeat, precautions d/ced  25. Tachycardic: resolved, increase propanolol to BID.  continueTizanidine increased to 6mg  TID. Resolved, amantadine increased back to 100mg  BID, continue this dose  26.  Dyskinetic movements in face and head Appear to have resolved, EEG reviewed and was negative for seizure.   27. Small sore on the base of 5th metatarsal head: local wound care, continue  28. Rash on face: resolved, discussed with mother could have been secondary to added sugars in nutritional supplements  29. Sacral pressure injury: air mattress ordered, Placed nursing order to offload q1 hour while awake, continue medihoney, wound care consulted, discussed with nursing that this is improving  30. Cervical extensor  weakness: tried decreasing tizanidine but spasticity appears increased and BP elevated so will increase back to 6mg  TID, continue this dose  31. Low protein stores: prosource ordered, continue  32. Intermittent lethargy: labs reviewed and are stable, discussed with team may be secondary to anti-spasticity medications  33. Blood blister to left heel: continue foam heel protectors.    LOS: 82 days A FACE TO FACE EVALUATION WAS PERFORMED  Nicole [name] 02/08/2023, 12:41 PM

## 2023-02-08 NOTE — Progress Notes (Signed)
Speech Language Pathology Daily Session Note  Patient Details  Name: Nicole [name] MRN: [id number] Date of Birth: [birth year]  Today's Date: 02/08/2023 SLP Individual Time: 2952-8413 SLP Individual Time Calculation (min): 19 min  Short Term Goals: Week 9: SLP Short Term Goal 1 (Week 9): Week 11 - Pt will present w/ appropriate response to functional stimuli (i.e. accept spoon, grasp basic item appropriately, response to tactile/auditory stimuli) 50% of the time w/ maxA multimodal cues SLP Short Term Goal 2 (Week 9): Week 11 - Pt will answer open ended questions w/ modA in 50% of opportunities SLP Short Term Goal 3 (Week 9): Week 11 - Pt will answer basic yes/no questions with modA in 60% of opportunities SLP Short Term Goal 4 (Week 9): Week 11 - Pt will utilize increased vocal intensity w/ maxA verbal cues to remain 70% intelligibile at phrase level  Skilled Therapeutic Interventions: SLP conducted skilled therapy session targeting cognitive retraining and communication goals. Upon entry to room, patient sleeping in TIS with reggae music playing in the background. Patient rousable to verbal and tactile cues and agreeable to participate in therapy tasks. Patient answered yes/no questions re: positioning and participation in session in 75% of opportunities. Patient observed to pick her nose and then stick her finger in her mouth repeatedly despite cues to utilize tissue. When presented with tissue box, patient grasped and pulled tissue out of the box with left hand given max cues, however continued to use finger instead of tissue after it was acquired. Patient not stimulable to increase vocal intensity this date given max verbal cues. SLP guided patient through card naming task with patient naming 2/3 objects presented given field of 2 verbally presented options. Soon after language task was initiated, patient became somnolent, no longer rousable to verbal cues. SLP attempted to stimulate alertness via  tactile and auditory means with limited change. SLP returned patient to reclined position in TIS, restarted music, and left patient with nursing for vitals check. SLP will continue to target goals per plan of care.       Pain Pain Assessment Pain Scale: Faces Faces Pain Scale: No hurt  Therapy/Group: Individual Therapy  [name], M.A., CCC-SLP   [name] 02/08/2023, 3:26 PM

## 2023-02-09 LAB — CREATININE, SERUM
Creatinine, Ser: 0.55 mg/dL (ref 0.44–1.00)
GFR, Estimated: >60 mL/min (ref >60)

## 2023-02-09 NOTE — Progress Notes (Addendum)
Speech Language Pathology Weekly Progress and Session Note  Patient Details  Name: Nicole [name] MRN: [id number] Date of Birth: 08-22-1975  Beginning of progress report period: February 02, 2023 End of progress report period: February 09, 2023  Today's Date: 02/09/2023 SLP Individual Time: 1415-1445 SLP Individual Time Calculation (min): 30 min  Short Term Goals: Week 11: SLP Short Term Goal 1 (Week 11): Week 11 - Pt will present w/ appropriate response to functional stimuli (i.e. accept spoon, grasp basic item appropriately, response to tactile/auditory stimuli) 50% of the time w/ maxA multimodal cues SLP Short Term Goal 1 - Progress (Week 11): Met SLP Short Term Goal 2 (Week 11): Week 11 - Pt will answer open ended questions w/ modA in 50% of opportunities SLP Short Term Goal 2 - Progress (Week 9): Met SLP Short Term Goal 3 (Week 11): Week 11 - Pt will answer basic yes/no questions with modA in 60% of opportunities SLP Short Term Goal 3 - Progress (Week 11): Met SLP Short Term Goal 4 (Week 11): Week 11 - Pt will utilize increased vocal intensity w/ maxA verbal cues to remain 70% intelligibile at phrase level SLP Short Term Goal 4 - Progress (Week 11): Not met    New Short Term Goals: Week 12: SLP Short Term Goal 1 (Week 12): Week 12 - Pt will present w/ appropriate response to functional stimuli (i.e. accept spoon, grasp basic item appropriately, response to tactile/auditory stimuli) 60% of the time w/ maxA multimodal cues SLP Short Term Goal 2 (Week 12): Week 12 - Pt will answer open ended questions w/ modA in 60% of opportunities SLP Short Term Goal 3 (Week 12): Week 12 - Pt will answer basic yes/no questions with modA in 70% of opportunities SLP Short Term Goal 4 (Week 12): Week 12 - Pt will utilize increased vocal intensity w/ maxA verbal cues to remain 70% intelligibile at phrase level  Weekly Progress Updates: Patient continues to make slow but functional progress in communication  and has met 3/4 STGs this week.  This reporting period, patient continues to demonstrate improved initiation of verbal responses both with yes/no questions and spontaneous verbalizations at the phrase level for functional communication. However, patient continues to utilize a low vocal intensity despite Max A multimodal cues which can impact her overall intelligibility. Functional communication can also fluctuate throughout the day depending on fatigue, etc. Patient and family education ongoing. Patient would benefit from continued skilled SLP intervention to maximize her functional communication and reduce caregiver burden.    Intensity: Minumum of 1-2 x/day, 30 to 90 minutes Frequency: 1 to 3 out of 7 days Duration/Length of Stay: TBD due to SNF placement Treatment/Interventions: Cognitive remediation/compensation;Internal/external aids;Speech/Language facilitation;Cueing hierarchy;Environmental controls;Therapeutic Activities;Functional tasks;Multimodal communication approach;Patient/family education;Therapeutic Exercise   Daily Session  Skilled Therapeutic Interventions:  Skilled treatment session focused on cognitive and communication goals. Upon arrival, patient was awake and alert while upright in the TIS wheelchair. Patient was moving the fingers on her RUE that appeared to mimic texting. Patient acknowledged SLP when she entered the room and reported she was typing in email. SLP initially thought patient was joking but was unable to terminate texting pattern throughout session despite max A multimodal cues. Patient reported the email was to check on a friend. SLP offered to write a letter for the patient but she declined. She also declined using a keyboard. SLP face timed a familiar friend of the patient's with patient demonstrating poor attention and continuing to "text." She did participate  in the a conversational exchange in 75% of opportunities. Throughout session, patient did demonstrate  improved vocal intensity and was ~90% intelligible at the phrase and sentence level. Patient left upright in TIS wheelchair with all needs within reach. Continue with current plan of care.    Pain No/Denies Pain   Therapy/Group: Individual Therapy  Adriahna Shearman 02/09/2023, 6:26 AM

## 2023-02-09 NOTE — Progress Notes (Signed)
Physical Therapy Session Note  Patient Details  Name: Nicole [name] MRN: [id number] Date of Birth: 1976/05/10  Today's Date: 02/09/2023 PT Individual Time: 4098-1191 PT Individual Time Calculation (min): 27 min   Short Term Goals: Week 9: PT Short Term Goal 1 (Week 9): Pt will initiate bed mobility with totalA PT Short Term Goal 2 (Week 9): Pt will maintain unsupported sitting balance for 30 seconds without LOB PT Short Term Goal 3 (Week 9): Pt will complete bed<>chair transfers with no more than totalA  Skilled Therapeutic Interventions/Progress Updates:      Pt in bed to start. Incontinent with saturated brief. Dependent care for brief change and for bed mobility.   Completed bed level stretching, primarily focusing on R>L hip rotators and adductors. Figure-4 stretching, piriformis stretching, and groin stretching to tolerance. Pt vocalizing a lot during session but is very difficult to understand, even with prompting.   Assisted into TIS w/c with dependent squat pivot transfer. Positioned in reclined position with full lap tray and pillows. All needs met at end.   Therapy Documentation Precautions:  Precautions Precautions: Fall Precaution Comments: Contact precautions & delayed processing Required Braces or Orthoses: Other Brace Other Brace: B adjustable night splints; B elbow "cosey" splints; L palm protector; R mitt Restrictions Weight Bearing Restrictions: No General:   Therapy/Group: Individual Therapy  Orrin Brigham 02/09/2023, 7:56 AM

## 2023-02-09 NOTE — Progress Notes (Signed)
Patient ID: Nicole [name], female   DOB: 05/26/1975, 47 y.o.   MRN: [id number]  Spoke with Lisa-Trullium regarding pt's discharge plan. Still awaiting long term care Medicaid to be approved so can place in a SNF. She reports the waiver is done 10/1. Will let worker know if still approved here.

## 2023-02-09 NOTE — Progress Notes (Signed)
PROGRESS NOTE   Subjective/Complaints: Patient's chart reviewed- No issues reported overnight Vitals signs stable  BP improved  ROS: Limited by cognition  Objective:   No results found. No results for input(s): "WBC", "HGB", "HCT", "PLT" in the last 72 hours.     Recent Labs    02/09/23 0557  CREATININE 0.55           Intake/Output Summary (Last 24 hours) at 02/09/2023 1103 Last data filed at 02/09/2023 0851 Gross per 24 hour  Intake 830 ml  Output --  Net 830 ml      Pressure Injury 10/31/22 Buttocks Left Unstageable - Full thickness tissue loss in which the base of the injury is covered by slough (yellow, tan, gray, green or brown) and/or eschar (tan, brown or black) in the wound bed. (Active)  10/31/22 2000  Location: Buttocks  Location Orientation: Left  Staging: Unstageable - Full thickness tissue loss in which the base of the injury is covered by slough (yellow, tan, gray, green or brown) and/or eschar (tan, brown or black) in the wound bed.  Wound Description (Comments):   Present on Admission: Yes     Pressure Injury 11/18/22 Toe (Comment  which one) Anterior;Right Deep Tissue Pressure Injury - Purple or maroon localized area of discolored intact skin or blood-filled blister due to damage of underlying soft tissue from pressure and/or shear. small are (Active)  11/18/22 1300  Location: Toe (Comment  which one)  Location Orientation: Anterior;Right  Staging: Deep Tissue Pressure Injury - Purple or maroon localized area of discolored intact skin or blood-filled blister due to damage of underlying soft tissue from pressure and/or shear.  Wound Description (Comments): small area on right great toe metatarsal  Present on Admission: Yes    Physical Exam: Vital Signs Blood pressure 124/85, pulse 90, temperature 98.6 F (37 C), temperature source Oral, resp. rate 17, height 5\' 1"  (1.549 m), weight 47.6  kg, SpO2 100%. Gen: no distress, normal appearing HEENT: oral mucosa pink and moist, NCAT Cardio: Reg rate Chest: normal effort, normal rate of breathing Abd: soft, non-distended Ext: no edema Psych: pleasant, normal affect Psychiatric: flat Neurological:oriented to self  Skin: + Scaling dried skin on head present-2 small excoriations, sore on the base of the 5th metatarsal head, rash on face . stage 2 to right toe, area of redness between left thumb and index finger-covered in dressing, left buttock unstageable area not viewed +redness to bilateral elbows  Neurologic: Minimal engagement on exam, tired. Answers "good morning"  Does not follow simple commands. Delayed processing  Tone:  MAS 3 right elbow,MAS 1 in RIght finger and wrist flexors  MAS 3 knee flexors, and MAS 3 L>R  hip adductors  MAS 3 L elbow , MAS 3-4 in L wrist and finger flexors  + LUE WHO  Motor 0/5 strength in LUE and BLE, some purposeful movement in RUE ; reflexively grasps hand Improved cervical extension  Withdraws to pain in bilateral lower and right upper extremities. Musculoskeletal: reduced bilateral shoulder and elbow ROM due to increased tone  Right wrist swelling -Ace wrapped.   Assessment/Plan: 1. Functional deficits which require 3+ hours per day of interdisciplinary therapy  in a comprehensive inpatient rehab setting. Physiatrist is providing close team supervision and 24 hour management of active medical problems listed below. Physiatrist and rehab team continue to assess barriers to discharge/monitor patient progress toward functional and medical goals  Care Tool:  Bathing    Body parts bathed by patient: Face, Abdomen, Chest   Body parts bathed by helper: Right arm, Left arm, Front perineal area, Buttocks, Right upper leg, Left upper leg, Right lower leg, Left lower leg Body parts n/a: Right arm, Left arm, Front perineal area, Buttocks, Right upper leg, Left upper leg, Right lower leg, Left  lower leg   Bathing assist Assist Level: Maximal Assistance - Patient 24 - 49%     Upper Body Dressing/Undressing Upper body dressing   What is the patient wearing?: Pull over shirt    Upper body assist Assist Level: Total Assistance - Patient < 25%    Lower Body Dressing/Undressing Lower body dressing      What is the patient wearing?: Pants, Incontinence brief     Lower body assist Assist for lower body dressing: Dependent - Patient 0%     Toileting Toileting Toileting Activity did not occur (Clothing management and hygiene only): N/A (no void or bm)  Toileting assist Assist for toileting: Total Assistance - Patient < 25%     Transfers Chair/bed transfer  Transfers assist  Chair/bed transfer activity did not occur: Safety/medical concerns  Chair/bed transfer assist level: Dependent - Patient 0%     Locomotion Ambulation   Ambulation assist   Ambulation activity did not occur: Safety/medical concerns  Assist level: 2 helpers   Max distance: 62ft   Walk 10 feet activity   Assist  Walk 10 feet activity did not occur: Safety/medical concerns  Assist level: 2 helpers     Walk 50 feet activity   Assist Walk 50 feet with 2 turns activity did not occur: Safety/medical concerns         Walk 150 feet activity   Assist Walk 150 feet activity did not occur: Safety/medical concerns         Walk 10 feet on uneven surface  activity   Assist Walk 10 feet on uneven surfaces activity did not occur: Safety/medical concerns         Wheelchair     Assist Is the patient using a wheelchair?: Yes Type of Wheelchair: Manual    Wheelchair assist level: Dependent - Patient 0% Max wheelchair distance: 7ft    Wheelchair 50 feet with 2 turns activity    Assist    Wheelchair 50 feet with 2 turns activity did not occur: Safety/medical concerns   Assist Level: Dependent - Patient 0%   Wheelchair 150 feet activity     Assist   Wheelchair 150 feet activity did not occur: Safety/medical concerns   Assist Level: Dependent - Patient 0%   Blood pressure 124/85, pulse 90, temperature 98.6 F (37 C), temperature source Oral, resp. rate 17, height 5\' 1"  (1.549 m), weight 47.6 kg, SpO2 100%.  Medical Problem List and Plan: 1. 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  Impaired mobility: continue Lovenox, weekly creatinine ordered             -antiplatelet therapy: N/A 3. Pain Management: Tylenol as needed  4. History of anxiety: discussed with her mom that she feels this was a big risk factor for her accident  5. Neuropsych/cognition: This patient is not capable of making decisions on her own behalf. 6. Left buttock unstageable ulcer.  continue Medihoney to buttock wound daily cover with foam dressing.  Changing dressing every 3 days or as needed soiling   7. Fluids/Electrolytes/Nutrition: Routine in and outs with follow-up chemistries  -Eating 100% most meals with assist  9/22- eating when fed 8.  Cavitary right lower lobe pneumonia likely aspiration pneumonia/MRSA pneumonia.  Resolved    Latest Ref Rng & Units 02/05/2023   11:12 AM  01/22/2023    6:36 AM 12/20/2022   10:54 AM  CBC  WBC 4.0 - 10.5 K/uL 8.5  8.1  5.6   Hemoglobin 12.0 - 15.0 g/dL 56.2  13.0  86.5   Hematocrit 36.0 - 46.0 % 46.7  47.5  42.8   Platelets 150 - 400 K/uL 284  274  253      9.  History of drug abuse.  Positive cocaine on urine drug screen.  Provide counseling  10.  AKI/hypovolemia and ATN.  resolved    Latest Ref Rng & Units 02/09/2023    5:57 AM 02/05/2023   11:12 AM 02/02/2023    5:30 AM  BMP  Glucose 70 - 99 mg/dL  784    BUN 6 - 20 mg/dL  13    Creatinine 6.96 - 1.00 mg/dL 2.95  2.84  1.32   Sodium 135 - 145 mmol/L  138    Potassium 3.5 - 5.1 mmol/L  4.0    Chloride 98 - 111 mmol/L  103    CO2 22 - 32 mmol/L  28    Calcium 8.9 - 10.3 mg/dL  9.0      11.  Mild transaminitis with rhabdomyolysis.  Both resolved  12.  Hypotension. Resolved, d/c midodrine  9/21- BP running 140s systolic-will con't to monitor   9/22- BP running 120s-140's over 90s- con't to monitor 13. Impaired initiation: continue amantadine 100mg  BID  14. Suboptimal vitamin D: continue 1,000U D3 daily  15. Spasticity:   -discussed serial casting with OT and her mom  - continue baclofen to 20mg  TID and tizanidine to 4mg  TID. Continue wrist, elbow brace, and PRAFOs. Ordered a night splint for LLE  Repeat CMP today given chronic tizanidine use  -9/7 continues to have significant tone despite use of tizanidine and baclofen, may be beneficial consider baclofen pump in outpatient setting if this causes significant difficulties in care  9/8 consider decrease baclofen if dyskinetic movements continue  9/21- Wearing PRAFOs 16. Bowel and bladder incontinence: continue bowel and bladder program   17. Left hand skin maceration: Eucerin ordered, discussed hand split with OT, conitnue  18. History of magnesium deficiency: magnesium 500mg  started HS    02/09/2023  6:12 AM 02/08/2023    7:53 PM 02/08/2023    3:07 PM  Vitals with BMI  Systolic 124 120 956  Diastolic 85  69 69  Pulse  90 80    19. HTN: see above Increase tizanidine to 6mg  TID. Magnesium supplement added HS. Increase propanolol to 20mg  TID     02/09/2023    6:12 AM 02/08/2023    7:53 PM 02/08/2023    3:07 PM  Vitals with BMI  Systolic 124 120 213  Diastolic 85 69 69  Pulse  90 80     20. Area of redness between left thumb and index finger: continue foam dressing  21. Scalp eczema: selsun blue ordered, continue  -9/7 patient's mother feels like this is causing her to itch, will start some hydrocortisone cream  22. Fatigue: improved, discussed with team may be secondary to anti-spasticity medications, B12 reviewed and is 335, in suboptimal range, will give 1,000U B12 injection 8/7, will order monthly as needed, metanx started, increase to BID, continue  23. Right wrist swelling: XR ordered and shows no acute fracture, shows 2.53mm ulnar variance, brace removed to given patient a break from it. Voltaren gel ordered prn for discomfort, continue, CT ordered and show no acute osseus injury, continue ace wrap  23. Dysphagia: continue D3/thins, d/c ensure as blood pressure severely elevates whenever restarted  24. MRSA nares positive: negative on repeat, precautions d/ced  25. Tachycardic: resolved, increase propanolol to BID.  continueTizanidine increased to 6mg  TID. Resolved, amantadine increased back to 100mg  BID, continue this dose  26.  Dyskinetic movements in face and head Appear to have resolved, EEG reviewed and was negative for seizure.   27. Small sore on the base of 5th metatarsal head: local wound care, continue  28. Rash on face: resolved, discussed with mother could have been secondary to added sugars in nutritional supplements  29. Sacral pressure injury: air mattress ordered, Placed nursing order to offload q1 hour while awake, continue medihoney, wound care consulted, discussed with nursing that this is improving  30. Cervical extensor weakness: tried decreasing  tizanidine but spasticity appears increased and BP elevated so will increase back to 6mg  TID, continue this dose  31. Low protein stores: prosource ordered, continue  32. Intermittent lethargy: labs reviewed and are stable, discussed with team may be secondary to anti-spasticity medications  33. Blood blister to left heel: continue foam heel protectors.    LOS: 83 days A FACE TO FACE EVALUATION WAS PERFORMED  Marvis Bakken P Onnie Hatchel 02/09/2023, 11:03 AM

## 2023-02-09 NOTE — Progress Notes (Signed)
Occupational Therapy Weekly Progress Note  Patient Details  Name: Nicole [name] MRN: [id number] Date of Birth: 06-04-1975  Beginning of progress report period: February 02, 2023 End of progress report period: February 09, 2023  Today's Date: 02/09/2023 OT Individual Time: 1003-1045 OT Individual Time Calculation (min): 42 min    Patient has met 1 of 2 short term goals.  Patient's higher level of alertness is helping to highlight her confusion.    Patient continues to demonstrate the following deficits: muscle weakness and muscle joint tightness, impaired timing and sequencing, abnormal tone, unbalanced muscle activation, motor apraxia, and decreased coordination, decreased visual perceptual skills and decreased visual motor skills, decreased midline orientation and decreased motor planning, decreased initiation, decreased attention, decreased awareness, decreased problem solving, decreased safety awareness, decreased memory, and delayed processing, and decreased sitting balance, decreased standing balance, decreased postural control, hemiplegia, and decreased balance strategies and therefore will continue to benefit from skilled OT intervention to enhance overall performance with BADL and Reduce care partner burden.  Patient progressing toward long term goals..  Continue plan of care.  OT Short Term Goals Week 11:  OT Short Term Goal 1 (Week 11): Patient will reach toward and grasp needed familiar functional item x 3 in one session OT Short Term Goal 1 - Progress (Week 11): Progressing toward goal OT Short Term Goal 2 (Week 11): Patient will demonstrate 75% digit extension in left hand following manual stretch OT Short Term Goal 2 - Progress (Week 11): Met  Week 12:  OT Short Term Goal 1 (Week 12): Patient will wash upper legs after set up and verbal cue with min assist OT Short Term Goal 2 (Week 12): Patient will reach toward lower leg and foot with min assist using RUE in prep for lower  body self care OT Short Term Goal 3 (Week 12): Patient will utilize LUE for part of one functional task with min assist   Skilled Therapeutic Interventions/Progress Updates:    Patient received seated in tilt in space wheelchair.  Patient frowning - reports "I don't know how to cope."  When asked what she was needing to cope with - patient did not reply.  When asked about coping with pain - she indicated yes.  Nursing in to bring medication - highlighted that some of her medication was intended to help with stiffness and pain.  Patient agreeable to get washed and dressed.  Patient handed warm wash cloth and she washed face, chest, and tops of thighs following set up.  Assisted patient to wash hair.  Patient chose clothing - chose green tshirt with SCAD on front.  When asked what SCAD stood for she said "[facility] [employer] agent."  Patient assisted by putting her right hand in sleeve, and with hand over hand grasped for LUE to pull through sleeve.  Patient winces with adduction of right shoulder as needed t apply deodorant under left arm.  Patient left up in wheelchair with Left resting hand splint on , and right mitt to prevent scratching at head.  Laptray in place with pillow under forearms.  Call bell in lap.    Therapy Documentation Precautions:  Precautions Precautions: Fall Precaution Comments: Contact precautions & delayed processing Required Braces or Orthoses: Other Brace Other Brace: B adjustable night splints; B elbow "cosey" splints; L palm protector; R mitt Restrictions Weight Bearing Restrictions: No   Pain:  No pain at start of session      Therapy/Group: Individual Therapy  Merleen Milliner  M 02/09/2023, 11:49 AM

## 2023-02-10 NOTE — Plan of Care (Signed)
  Problem: Consults Goal: RH STROKE PATIENT EDUCATION Description: See Patient Education module for education specifics  Outcome: Progressing   Problem: RH BOWEL ELIMINATION Goal: RH STG MANAGE BOWEL WITH ASSISTANCE Description: STG Manage Bowel with mod I Assistance. Outcome: Progressing Goal: RH STG MANAGE BOWEL W/MEDICATION W/ASSISTANCE Description: STG Manage Bowel with Medication with mod I Assistance. Outcome: Progressing   Problem: RH BLADDER ELIMINATION Goal: RH STG MANAGE BLADDER WITH ASSISTANCE Description: STG Manage Bladder With toileting Assistance Outcome: Progressing   Problem: RH SKIN INTEGRITY Goal: RH STG MAINTAIN SKIN INTEGRITY WITH ASSISTANCE Description: STG Maintain Skin Integrity With min  Assistance. Outcome: Progressing

## 2023-02-10 NOTE — Progress Notes (Signed)
PROGRESS NOTE   Subjective/Complaints: Mom at beside helping with feeding, pt with episodic jerks of neck to right side , non rythmic, intermittent  ROS: Limited by cognition  Objective:   No results found. No results for input(s): "WBC", "HGB", "HCT", "PLT" in the last 72 hours.     Recent Labs    02/09/23 0557  CREATININE 0.55           Intake/Output Summary (Last 24 hours) at 02/10/2023 1241 Last data filed at 02/09/2023 1800 Gross per 24 hour  Intake 716 ml  Output --  Net 716 ml      Pressure Injury 10/31/22 Buttocks Left Unstageable - Full thickness tissue loss in which the base of the injury is covered by slough (yellow, tan, gray, green or brown) and/or eschar (tan, brown or black) in the wound bed. (Active)  10/31/22 2000  Location: Buttocks  Location Orientation: Left  Staging: Unstageable - Full thickness tissue loss in which the base of the injury is covered by slough (yellow, tan, gray, green or brown) and/or eschar (tan, brown or black) in the wound bed.  Wound Description (Comments):   Present on Admission: Yes     Pressure Injury 11/18/22 Toe (Comment  which one) Anterior;Right Deep Tissue Pressure Injury - Purple or maroon localized area of discolored intact skin or blood-filled blister due to damage of underlying soft tissue from pressure and/or shear. small are (Active)  11/18/22 1300  Location: Toe (Comment  which one)  Location Orientation: Anterior;Right  Staging: Deep Tissue Pressure Injury - Purple or maroon localized area of discolored intact skin or blood-filled blister due to damage of underlying soft tissue from pressure and/or shear.  Wound Description (Comments): small area on right great toe metatarsal  Present on Admission: Yes     Pressure Injury 02/09/23 Heel Left Deep Tissue Pressure Injury - Purple or maroon localized area of discolored intact skin or blood-filled blister  due to damage of underlying soft tissue from pressure and/or shear. Black/purple and boggy,  size of a quar (Active)  02/09/23 0543  Location: Heel  Location Orientation: Left  Staging: Deep Tissue Pressure Injury - Purple or maroon localized area of discolored intact skin or blood-filled blister due to damage of underlying soft tissue from pressure and/or shear.  Wound Description (Comments): Black/purple and boggy,  size of a quarter.  Foam dressing applied  Present on Admission: No    Physical Exam: Vital Signs Blood pressure 126/83, pulse 86, temperature 97.7 F (36.5 C), temperature source Oral, resp. rate 18, height 5\' 1"  (1.549 m), weight 47.6 kg, SpO2 99%.  General: No acute distress Mood and affect are appropriate Heart: Regular rate and rhythm no rubs murmurs or extra sounds Lungs: Clear to auscultation, breathing unlabored, no rales or wheezes Abdomen: Positive bowel sounds, soft nontender to palpation, nondistended Extremities: No clubbing, cyanosis, or edema Skin: No evidence of breakdown, no evidence of rash   Skin: + Scaling dried skin on head present-2 small excoriations, sore on the base of the 5th metatarsal head, rash on face . stage 2 to right toe, area of redness between left thumb and index finger-covered in dressing, left buttock unstageable  area not viewed +redness to bilateral elbows  Neurologic: Minimal engagement on exam, tired. Answers "good morning"  Does not follow simple commands. Delayed processing  Tone:  MAS 1 right elbow,MAS 1 in RIght finger and wrist flexors  MAS 3 knee flexors, and MAS 3 L>R  hip adductors  MAS 3 L elbow , MAS 3-4 in L wrist and finger flexors  + LUE WHO  Motor 0/5 strength in LUE and BLE, Musculoskeletal: reduced bilateral shoulder and elbow ROM due to increased tone  Right wrist swelling -Ace wrapped.   Assessment/Plan: 1. Functional deficits which require 3+ hours per day of interdisciplinary therapy in a comprehensive  inpatient rehab setting. Physiatrist is providing close team supervision and 24 hour management of active medical problems listed below. Physiatrist and rehab team continue to assess barriers to discharge/monitor patient progress toward functional and medical goals  Care Tool:  Bathing    Body parts bathed by patient: Face, Abdomen, Chest, Left upper leg, Right upper leg   Body parts bathed by helper: Right arm, Left arm, Front perineal area, Buttocks, Left lower leg, Right lower leg Body parts n/a: Right arm, Left arm, Front perineal area, Buttocks, Right upper leg, Left upper leg, Right lower leg, Left lower leg   Bathing assist Assist Level: Maximal Assistance - Patient 24 - 49%     Upper Body Dressing/Undressing Upper body dressing   What is the patient wearing?: Pull over shirt    Upper body assist Assist Level: Maximal Assistance - Patient 25 - 49%    Lower Body Dressing/Undressing Lower body dressing      What is the patient wearing?: Pants, Incontinence brief     Lower body assist Assist for lower body dressing: Dependent - Patient 0%     Toileting Toileting Toileting Activity did not occur (Clothing management and hygiene only): N/A (no void or bm)  Toileting assist Assist for toileting: Total Assistance - Patient < 25%     Transfers Chair/bed transfer  Transfers assist  Chair/bed transfer activity did not occur: Safety/medical concerns  Chair/bed transfer assist level: Total Assistance - Patient < 25%     Locomotion Ambulation   Ambulation assist   Ambulation activity did not occur: Safety/medical concerns  Assist level: 2 helpers   Max distance: 56ft   Walk 10 feet activity   Assist  Walk 10 feet activity did not occur: Safety/medical concerns  Assist level: 2 helpers     Walk 50 feet activity   Assist Walk 50 feet with 2 turns activity did not occur: Safety/medical concerns         Walk 150 feet activity   Assist Walk 150  feet activity did not occur: Safety/medical concerns         Walk 10 feet on uneven surface  activity   Assist Walk 10 feet on uneven surfaces activity did not occur: Safety/medical concerns         Wheelchair     Assist Is the patient using a wheelchair?: Yes Type of Wheelchair: Manual    Wheelchair assist level: Dependent - Patient 0% Max wheelchair distance: 68ft    Wheelchair 50 feet with 2 turns activity    Assist    Wheelchair 50 feet with 2 turns activity did not occur: Safety/medical concerns   Assist Level: Dependent - Patient 0%   Wheelchair 150 feet activity     Assist  Wheelchair 150 feet activity did not occur: Safety/medical concerns   Assist Level: Dependent - Patient  0%   Blood pressure 126/83, pulse 86, temperature 97.7 F (36.5 C), temperature source Oral, resp. rate 18, height 5\' 1"  (1.549 m), weight 47.6 kg, SpO2 99%.  Medical Problem List and Plan: 1. 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 Code status  DNR  -Interdisciplinary Team Conference today   Con't CIR PT, OT and SLP  2.  Impaired mobility: continue Lovenox, weekly creatinine ordered             -antiplatelet therapy: N/A 3. Pain Management: Tylenol as needed  4. History of anxiety: discussed with her mom that she feels this was a big risk factor for her accident  5. Neuropsych/cognition: This patient is not capable of making decisions on her own behalf. 6. Left buttock  unstageable ulcer.  continue Medihoney to buttock wound daily cover with foam dressing.  Changing dressing every 3 days or as needed soiling   7. Fluids/Electrolytes/Nutrition: Routine in and outs with follow-up chemistries  -Eating 100% most meals with assist  8.  Cavitary right lower lobe pneumonia likely aspiration pneumonia/MRSA pneumonia.  Resolved    Latest Ref Rng & Units 02/05/2023   11:12 AM 01/22/2023    6:36 AM 12/20/2022   10:54 AM  CBC  WBC 4.0 - 10.5 K/uL 8.5  8.1  5.6   Hemoglobin 12.0 - 15.0 g/dL 13.0  86.5  78.4   Hematocrit 36.0 - 46.0 % 46.7  47.5  42.8   Platelets 150 - 400 K/uL 284  274  253      9.  History of drug abuse.  Positive cocaine on urine drug screen.  Provide counseling  10.  AKI/hypovolemia and ATN.  resolved    Latest Ref Rng & Units 02/09/2023    5:57 AM 02/05/2023   11:12 AM 02/02/2023    5:30 AM  BMP  Glucose 70 - 99 mg/dL  696    BUN 6 - 20 mg/dL  13    Creatinine 2.95 - 1.00 mg/dL 2.84  1.32  4.40   Sodium 135 - 145 mmol/L  138    Potassium 3.5 - 5.1 mmol/L  4.0    Chloride 98 - 111 mmol/L  103    CO2 22 - 32 mmol/L  28    Calcium 8.9 - 10.3 mg/dL  9.0      11.  Mild transaminitis with rhabdomyolysis.  Both resolved  12.  Hypotension. Resolved, d/c midodrine  9/21- BP running 140s systolic-will con't to monitor   9/22- BP running 120s-140's over 90s- con't to monitor 13. Impaired initiation: continue amantadine 100mg  BID  14. Suboptimal vitamin D: continue 1,000U D3 daily  15. Spasticity:   -discussed serial casting with OT and her mom  - continue baclofen to 20mg  TID and tizanidine to 4mg  TID. Continue wrist, elbow brace, and PRAFOs. Ordered a night splint for LLE  Repeat CMP today given chronic tizanidine use  -9/7 continues to have significant tone despite  use of tizanidine and baclofen, may be beneficial consider baclofen pump in outpatient setting if this causes significant difficulties in care  9/8 consider decrease baclofen if  dyskinetic movements continue  9/21- Wearing PRAFOs 16. Bowel and bladder incontinence: continue bowel and bladder program   17. Left hand skin maceration: Eucerin ordered, discussed hand split with OT, conitnue  18. History of magnesium deficiency: magnesium 500mg  started HS    02/10/2023    3:10 AM 02/09/2023    8:03 PM 02/09/2023    2:07 PM  Vitals with BMI  Systolic 126 112 161  Diastolic 83 72 78  Pulse 86 100 87    19. HTN: see above Increase tizanidine to 6mg  TID. Magnesium supplement added HS. Increase propanolol to 20mg  TID     02/10/2023    3:10 AM 02/09/2023    8:03 PM 02/09/2023    2:07 PM  Vitals with BMI  Systolic 126 112 096  Diastolic 83 72 78  Pulse 86 100 87     20. Area of redness between left thumb and index finger: continue foam dressing  21. Scalp eczema: selsun blue ordered, continue  -9/7 patient's mother feels like this is causing her to itch, will start some hydrocortisone cream  22. Fatigue: improved, discussed with team may be secondary to anti-spasticity medications, B12 reviewed and is 335, in suboptimal range, will give 1,000U B12 injection 8/7, will order monthly as needed, metanx started, increase to BID, continue  23. Right wrist swelling: XR ordered and shows no acute fracture, shows 2.66mm ulnar variance, brace removed to given patient a break from it. Voltaren gel ordered prn for discomfort, continue, CT ordered and show no acute osseus injury, continue ace wrap  23. Dysphagia: continue D3/thins, d/c ensure as blood pressure severely elevates whenever restarted  24. MRSA nares positive: negative on repeat, precautions d/ced  25. Tachycardic: resolved, increase propanolol to BID.  continueTizanidine increased to 6mg  TID. Resolved, amantadine increased back to 100mg  BID, continue this dose  26.  Dyskinetic movements in head and neck - appears to be myoclonic jerks related to hypoxic brain injury - as discussed with pt mom, no specific meds  for this on antispasticity meds , consioder benzo although with pt's SUD would avoid this class Appear to have resolved, EEG reviewed and was negative for seizure.   27. Small sore on the base of 5th metatarsal head: local wound care, continue  28. Rash on face: resolved, discussed with mother could have been secondary to added sugars in nutritional supplements  29. Sacral pressure injury: air mattress ordered, Placed nursing order to offload q1 hour while awake, continue medihoney, wound care consulted, discussed with nursing that this is improving  30. Cervical extensor weakness: tried decreasing tizanidine but spasticity appears increased and BP elevated so will increase back to 6mg  TID, continue this dose  31. Low protein stores: prosource ordered, continue  32. Intermittent lethargy: labs reviewed and are stable, discussed with team may be secondary to anti-spasticity medications  33. Blood blister to left heel: continue foam heel protectors.    LOS: 84 days A FACE TO FACE EVALUATION WAS PERFORMED  Erick Colace 02/10/2023, 12:41 PM

## 2023-02-11 NOTE — Plan of Care (Signed)
  Problem: RH SKIN INTEGRITY Goal: RH STG MAINTAIN SKIN INTEGRITY WITH ASSISTANCE Description: STG Maintain Skin Integrity With min  Assistance. Outcome: Progressing   Problem: RH BLADDER ELIMINATION Goal: RH STG MANAGE BLADDER WITH ASSISTANCE Description: STG Manage Bladder With toileting Assistance Outcome: Progressing   Problem: RH BOWEL ELIMINATION Goal: RH STG MANAGE BOWEL W/MEDICATION W/ASSISTANCE Description: STG Manage Bowel with Medication with mod I Assistance. Outcome: Progressing   Problem: RH BOWEL ELIMINATION Goal: RH STG MANAGE BOWEL WITH ASSISTANCE Description: STG Manage Bowel with mod I Assistance. Outcome: Progressing   Problem: Consults Goal: RH STROKE PATIENT EDUCATION Description: See Patient Education module for education specifics  Outcome: Progressing

## 2023-02-11 NOTE — Progress Notes (Signed)
PROGRESS NOTE   Subjective/Complaints:  Myoclonic jerks head and neck better at present.  Minimal verbal output, mainly single words She is touching fingers of right hand to right knee but cannot verbalize what she is trying to do   ROS: Limited by cognition  Objective:   No results found. No results for input(s): "WBC", "HGB", "HCT", "PLT" in the last 72 hours.     Recent Labs    02/09/23 0557  CREATININE 0.55           Intake/Output Summary (Last 24 hours) at 02/11/2023 1121 Last data filed at 02/10/2023 1755 Gross per 24 hour  Intake 716 ml  Output --  Net 716 ml      Pressure Injury 10/31/22 Buttocks Left Unstageable - Full thickness tissue loss in which the base of the injury is covered by slough (yellow, tan, gray, green or brown) and/or eschar (tan, brown or black) in the wound bed. (Active)  10/31/22 2000  Location: Buttocks  Location Orientation: Left  Staging: Unstageable - Full thickness tissue loss in which the base of the injury is covered by slough (yellow, tan, gray, green or brown) and/or eschar (tan, brown or black) in the wound bed.  Wound Description (Comments):   Present on Admission: Yes     Pressure Injury 11/18/22 Toe (Comment  which one) Anterior;Right Deep Tissue Pressure Injury - Purple or maroon localized area of discolored intact skin or blood-filled blister due to damage of underlying soft tissue from pressure and/or shear. small are (Active)  11/18/22 1300  Location: Toe (Comment  which one)  Location Orientation: Anterior;Right  Staging: Deep Tissue Pressure Injury - Purple or maroon localized area of discolored intact skin or blood-filled blister due to damage of underlying soft tissue from pressure and/or shear.  Wound Description (Comments): small area on right great toe metatarsal  Present on Admission: Yes     Pressure Injury 02/09/23 Heel Left Deep Tissue Pressure Injury  - Purple or maroon localized area of discolored intact skin or blood-filled blister due to damage of underlying soft tissue from pressure and/or shear. Black/purple and boggy,  size of a quar (Active)  02/09/23 0543  Location: Heel  Location Orientation: Left  Staging: Deep Tissue Pressure Injury - Purple or maroon localized area of discolored intact skin or blood-filled blister due to damage of underlying soft tissue from pressure and/or shear.  Wound Description (Comments): Black/purple and boggy,  size of a quarter.  Foam dressing applied  Present on Admission: No    Physical Exam: Vital Signs Blood pressure (!) 140/90, pulse 86, temperature 97.7 F (36.5 C), temperature source Oral, resp. rate 18, height 5\' 1"  (1.549 m), weight 47.6 kg, SpO2 100%.  General: No acute distress Mood and affect are appropriate Heart: Regular rate and rhythm no rubs murmurs or extra sounds Lungs: Clear to auscultation, breathing unlabored, no rales or wheezes Abdomen: Positive bowel sounds, soft nontender to palpation, nondistended Extremities: No clubbing, cyanosis, or edema Skin: No evidence of breakdown, no evidence of rash   Skin: + Scaling dried skin on head present-2 small excoriations, sore on the base of the 5th metatarsal head, rash on face . stage  2 to right toe, area of redness between left thumb and index finger-covered in dressing, left buttock unstageable area not viewed +redness to bilateral elbows  Neurologic: Minimal engagement on exam, tired. Answers "good morning"  Does not follow simple commands. Delayed processing  Tone:  MAS 1 right elbow,MAS 1 in RIght finger and wrist flexors  MAS 3 knee flexors, and MAS 3 L>R  hip adductors  MAS 3 L elbow , MAS 3-4 in L wrist and finger flexors  + LUE WHO  Motor 0/5 strength in LUE and BLE, Musculoskeletal: reduced bilateral shoulder and elbow ROM due to increased tone , pain with passive knee extension bilaterally  Right wrist swelling  -Ace wrapped.   Assessment/Plan: 1. Functional deficits which require 3+ hours per day of interdisciplinary therapy in a comprehensive inpatient rehab setting. Physiatrist is providing close team supervision and 24 hour management of active medical problems listed below. Physiatrist and rehab team continue to assess barriers to discharge/monitor patient progress toward functional and medical goals  Care Tool:  Bathing    Body parts bathed by patient: Face, Abdomen, Chest, Left upper leg, Right upper leg   Body parts bathed by helper: Right arm, Left arm, Front perineal area, Buttocks, Left lower leg, Right lower leg Body parts n/a: Right arm, Left arm, Front perineal area, Buttocks, Right upper leg, Left upper leg, Right lower leg, Left lower leg   Bathing assist Assist Level: Maximal Assistance - Patient 24 - 49%     Upper Body Dressing/Undressing Upper body dressing   What is the patient wearing?: Pull over shirt    Upper body assist Assist Level: Maximal Assistance - Patient 25 - 49%    Lower Body Dressing/Undressing Lower body dressing      What is the patient wearing?: Pants, Incontinence brief     Lower body assist Assist for lower body dressing: Dependent - Patient 0%     Toileting Toileting Toileting Activity did not occur (Clothing management and hygiene only): N/A (no void or bm)  Toileting assist Assist for toileting: Total Assistance - Patient < 25%     Transfers Chair/bed transfer  Transfers assist  Chair/bed transfer activity did not occur: Safety/medical concerns  Chair/bed transfer assist level: Total Assistance - Patient < 25%     Locomotion Ambulation   Ambulation assist   Ambulation activity did not occur: Safety/medical concerns  Assist level: 2 helpers   Max distance: 4ft   Walk 10 feet activity   Assist  Walk 10 feet activity did not occur: Safety/medical concerns  Assist level: 2 helpers     Walk 50 feet  activity   Assist Walk 50 feet with 2 turns activity did not occur: Safety/medical concerns         Walk 150 feet activity   Assist Walk 150 feet activity did not occur: Safety/medical concerns         Walk 10 feet on uneven surface  activity   Assist Walk 10 feet on uneven surfaces activity did not occur: Safety/medical concerns         Wheelchair     Assist Is the patient using a wheelchair?: Yes Type of Wheelchair: Manual    Wheelchair assist level: Dependent - Patient 0% Max wheelchair distance: 87ft    Wheelchair 50 feet with 2 turns activity    Assist    Wheelchair 50 feet with 2 turns activity did not occur: Safety/medical concerns   Assist Level: Dependent - Patient 0%   Wheelchair  150 feet activity     Assist  Wheelchair 150 feet activity did not occur: Safety/medical concerns   Assist Level: Dependent - Patient 0%   Blood pressure (!) 140/90, pulse 86, temperature 97.7 F (36.5 C), temperature source Oral, resp. rate 18, height 5\' 1"  (1.549 m), weight 47.6 kg, SpO2 100%.  Medical Problem List and Plan: 1. 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 Code status  DNR  -Interdisciplinary Team Conference today   Con't CIR PT, OT and SLP  2.  Impaired mobility: continue Lovenox, weekly creatinine ordered             -antiplatelet therapy: N/A 3. Pain Management: Tylenol as needed  4. History of anxiety: discussed with her mom that she feels this was  a big risk factor for her accident  5. Neuropsych/cognition: This patient is not capable of making decisions on her own behalf. 6. Left buttock unstageable ulcer.  continue Medihoney to buttock wound daily cover with foam dressing.  Changing dressing every 3 days or as needed soiling   7. Fluids/Electrolytes/Nutrition: Routine in and outs with follow-up chemistries  -Eating 100% most meals with assist  8.  Cavitary right lower lobe pneumonia likely aspiration pneumonia/MRSA pneumonia.  Resolved    Latest Ref Rng & Units 02/05/2023   11:12 AM 01/22/2023    6:36 AM 12/20/2022   10:54 AM  CBC  WBC 4.0 - 10.5 K/uL 8.5  8.1  5.6   Hemoglobin 12.0 - 15.0 g/dL 40.9  81.1  91.4   Hematocrit 36.0 - 46.0 % 46.7  47.5  42.8   Platelets 150 - 400 K/uL 284  274  253      9.  History of drug abuse.  Positive cocaine on urine drug screen.  Provide counseling  10.  AKI/hypovolemia and ATN.  resolved    Latest Ref Rng & Units 02/09/2023    5:57 AM 02/05/2023   11:12 AM 02/02/2023    5:30 AM  BMP  Glucose 70 - 99 mg/dL  782    BUN 6 - 20 mg/dL  13    Creatinine 9.56 - 1.00 mg/dL 2.13  0.86  5.78   Sodium 135 - 145 mmol/L  138    Potassium 3.5 - 5.1 mmol/L  4.0    Chloride 98 - 111 mmol/L  103    CO2 22 - 32 mmol/L  28    Calcium 8.9 - 10.3 mg/dL  9.0      11.  Mild transaminitis with rhabdomyolysis.  Both resolved  12.  Hypotension. Resolved, d/c midodrine  9/21- BP running 140s systolic-will con't to monitor   9/22- BP running 120s-140's over 90s- con't to monitor 13. Impaired initiation: continue amantadine 100mg  BID  14. Suboptimal vitamin D: continue 1,000U D3 daily  15. Spasticity:   -discussed serial casting with OT and her mom  - continue baclofen to 20mg  TID and tizanidine to 4mg  TID. Continue wrist,  elbow brace, and PRAFOs. Ordered a night splint for LLE  Repeat CMP today given chronic tizanidine use  -9/7 continues to have significant tone despite use of tizanidine and baclofen,  may be beneficial consider baclofen pump in outpatient setting if this causes significant difficulties in care  9/8 consider decrease baclofen if dyskinetic movements continue  9/21- Wearing PRAFOs 16. Bowel and bladder incontinence: continue bowel and bladder program   17. Left hand skin maceration: Eucerin ordered, discussed hand split with OT, conitnue  18. History of magnesium deficiency: magnesium 500mg  started HS    02/11/2023    4:33 AM 02/10/2023    7:40 PM 02/10/2023    2:54 PM  Vitals with BMI  Systolic 140 141 742  Diastolic 90 85 90  Pulse 86 94 95    19. HTN: see above Increase tizanidine to 6mg  TID. Magnesium supplement added HS. Increase propanolol to 20mg  TID     02/11/2023    4:33 AM 02/10/2023    7:40 PM 02/10/2023    2:54 PM  Vitals with BMI  Systolic 140 141 595  Diastolic 90 85 90  Pulse 86 94 95     20. Area of redness between left thumb and index finger: continue foam dressing  21. Scalp eczema: selsun blue ordered, continue  -9/7 patient's mother feels like this is causing her to itch, will start some hydrocortisone cream  22. Fatigue: improved, discussed with team may be secondary to anti-spasticity medications, B12 reviewed and is 335, in suboptimal range, will give 1,000U B12 injection 8/7, will order monthly as needed, metanx started, increase to BID, continue  23. Right wrist swelling: XR ordered and shows no acute fracture, shows 2.30mm ulnar variance, brace removed to given patient a break from it. Voltaren gel ordered prn for discomfort, continue, CT ordered and show no acute osseus injury, continue ace wrap  23. Dysphagia: continue D3/thins, d/c ensure as blood pressure severely elevates whenever restarted  24. MRSA nares positive: negative on repeat, precautions d/ced  25. Tachycardic: resolved, increase propanolol to BID.  continueTizanidine increased to 6mg  TID. Resolved, amantadine increased back to 100mg  BID, continue this dose  26.   Dyskinetic movements in head and neck - appears to be myoclonic jerks related to hypoxic brain injury - as discussed with pt mom, no specific meds for this on antispasticity meds , consioder benzo although with pt's SUD would avoid this class Appear to have resolved, EEG reviewed and was negative for seizure.   27. Small sore on the base of 5th metatarsal head: local wound care, continue  28. Rash on face: resolved, discussed with mother could have been secondary to added sugars in nutritional supplements  29. Sacral pressure injury: air mattress ordered, Placed nursing order to offload q1 hour while awake, continue medihoney, wound care consulted, discussed with nursing that this is improving  30. Cervical extensor weakness: tried decreasing tizanidine but spasticity appears increased and BP elevated so will increase back to 6mg  TID, continue this dose  31. Low protein stores: prosource ordered, continue  32. Intermittent lethargy: labs reviewed and are stable, discussed with team may be secondary to anti-spasticity medications  33. Blood blister to left heel: continue foam heel protectors.    LOS: 85 days A FACE TO FACE EVALUATION WAS PERFORMED  Nicole [name] 02/11/2023, 11:21 AM

## 2023-02-12 NOTE — Progress Notes (Signed)
PROGRESS NOTE   Subjective/Complaints: Complaining of knee pain, will try voltaren gel No issues overnight BP slightly elevated  ROS: Limited by cognition, +knee pain  Objective:   No results found. No results for input(s): "WBC", "HGB", "HCT", "PLT" in the last 72 hours.     No results for input(s): "NA", "K", "CL", "CO2", "GLUCOSE", "BUN", "CREATININE", "CALCIUM" in the last 72 hours.          Intake/Output Summary (Last 24 hours) at 02/12/2023 1028 Last data filed at 02/11/2023 1750 Gross per 24 hour  Intake 355 ml  Output --  Net 355 ml      Pressure Injury 10/31/22 Buttocks Left Unstageable - Full thickness tissue loss in which the base of the injury is covered by slough (yellow, tan, gray, green or brown) and/or eschar (tan, brown or black) in the wound bed. (Active)  10/31/22 2000  Location: Buttocks  Location Orientation: Left  Staging: Unstageable - Full thickness tissue loss in which the base of the injury is covered by slough (yellow, tan, gray, green or brown) and/or eschar (tan, brown or black) in the wound bed.  Wound Description (Comments):   Present on Admission: Yes     Pressure Injury 11/18/22 Toe (Comment  which one) Anterior;Right Deep Tissue Pressure Injury - Purple or maroon localized area of discolored intact skin or blood-filled blister due to damage of underlying soft tissue from pressure and/or shear. small are (Active)  11/18/22 1300  Location: Toe (Comment  which one)  Location Orientation: Anterior;Right  Staging: Deep Tissue Pressure Injury - Purple or maroon localized area of discolored intact skin or blood-filled blister due to damage of underlying soft tissue from pressure and/or shear.  Wound Description (Comments): small area on right great toe metatarsal  Present on Admission: Yes     Pressure Injury 02/09/23 Heel Left Deep Tissue Pressure Injury - Purple or maroon localized  area of discolored intact skin or blood-filled blister due to damage of underlying soft tissue from pressure and/or shear. Black/purple and boggy,  size of a quar (Active)  02/09/23 0543  Location: Heel  Location Orientation: Left  Staging: Deep Tissue Pressure Injury - Purple or maroon localized area of discolored intact skin or blood-filled blister due to damage of underlying soft tissue from pressure and/or shear.  Wound Description (Comments): Black/purple and boggy,  size of a quarter.  Foam dressing applied  Present on Admission: No    Physical Exam: Vital Signs Blood pressure (!) 127/93, pulse 86, temperature 97.8 F (36.6 C), temperature source Oral, resp. rate 18, height 5\' 1"  (1.549 m), weight 47.6 kg, SpO2 98%.  Gen: no distress, normal appearing HEENT: oral mucosa pink and moist, NCAT Cardio: Reg rate Chest: normal effort, normal rate of breathing Abd: soft, non-distended Ext: no edema Psych: pleasant, normal affect  Skin: + Scaling dried skin on head present-2 small excoriations, sore on the base of the 5th metatarsal head, rash on face . stage 2 to right toe, area of redness between left thumb and index finger-covered in dressing, left buttock unstageable area not viewed +redness to bilateral elbows  Neurologic: Minimal engagement on exam, tired. Answers "good morning"  Does  not follow simple commands. Delayed processing  Tone:  MAS 1 right elbow,MAS 1 in RIght finger and wrist flexors  MAS 3 knee flexors, and MAS 3 L>R  hip adductors  MAS 3 L elbow , MAS 3-4 in L wrist and finger flexors  + LUE WHO  Motor 0/5 strength in LUE and BLE, Musculoskeletal: reduced bilateral shoulder and elbow ROM due to increased tone , pain with passive knee extension bilaterally  Right wrist swelling -Ace wrapped.   Assessment/Plan: 1. Functional deficits which require 3+ hours per day of interdisciplinary therapy in a comprehensive inpatient rehab setting. Physiatrist is providing  close team supervision and 24 hour management of active medical problems listed below. Physiatrist and rehab team continue to assess barriers to discharge/monitor patient progress toward functional and medical goals  Care Tool:  Bathing    Body parts bathed by patient: Face, Abdomen, Chest, Left upper leg, Right upper leg   Body parts bathed by helper: Right arm, Left arm, Front perineal area, Buttocks, Left lower leg, Right lower leg Body parts n/a: Right arm, Left arm, Front perineal area, Buttocks, Right upper leg, Left upper leg, Right lower leg, Left lower leg   Bathing assist Assist Level: Maximal Assistance - Patient 24 - 49%     Upper Body Dressing/Undressing Upper body dressing   What is the patient wearing?: Pull over shirt    Upper body assist Assist Level: Maximal Assistance - Patient 25 - 49%    Lower Body Dressing/Undressing Lower body dressing      What is the patient wearing?: Pants, Incontinence brief     Lower body assist Assist for lower body dressing: Dependent - Patient 0%     Toileting Toileting Toileting Activity did not occur (Clothing management and hygiene only): N/A (no void or bm)  Toileting assist Assist for toileting: Total Assistance - Patient < 25%     Transfers Chair/bed transfer  Transfers assist  Chair/bed transfer activity did not occur: Safety/medical concerns  Chair/bed transfer assist level: Total Assistance - Patient < 25%     Locomotion Ambulation   Ambulation assist   Ambulation activity did not occur: Safety/medical concerns  Assist level: 2 helpers   Max distance: 26ft   Walk 10 feet activity   Assist  Walk 10 feet activity did not occur: Safety/medical concerns  Assist level: 2 helpers     Walk 50 feet activity   Assist Walk 50 feet with 2 turns activity did not occur: Safety/medical concerns         Walk 150 feet activity   Assist Walk 150 feet activity did not occur: Safety/medical  concerns         Walk 10 feet on uneven surface  activity   Assist Walk 10 feet on uneven surfaces activity did not occur: Safety/medical concerns         Wheelchair     Assist Is the patient using a wheelchair?: Yes Type of Wheelchair: Manual    Wheelchair assist level: Dependent - Patient 0% Max wheelchair distance: 35ft    Wheelchair 50 feet with 2 turns activity    Assist    Wheelchair 50 feet with 2 turns activity did not occur: Safety/medical concerns   Assist Level: Dependent - Patient 0%   Wheelchair 150 feet activity     Assist  Wheelchair 150 feet activity did not occur: Safety/medical concerns   Assist Level: Dependent - Patient 0%   Blood pressure (!) 127/93, pulse 86, temperature 97.8 F (  36.6 C), temperature source Oral, resp. rate 18, height 5\' 1"  (1.549 m), weight 47.6 kg, SpO2 98%.  Medical Problem List and Plan: 1. 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 Code status  DNR Continue CIR PT, OT and SLP  2.  Impaired mobility: continue Lovenox, weekly creatinine ordered             -antiplatelet therapy: N/A  3. Knee paint: Tylenol as needed, voltaren gel prn  4. History of anxiety: discussed with her mom that she feels this was a big risk factor for her accident  5. Neuropsych/cognition: This patient is not capable of making decisions on her own behalf. 6. Left buttock unstageable ulcer.  continue Medihoney to buttock wound daily cover with foam  dressing.  Changing dressing every 3 days or as needed soiling   7. Fluids/Electrolytes/Nutrition: Routine in and outs with follow-up chemistries  -Eating 100% most meals with assist  8.  Cavitary right lower lobe pneumonia likely aspiration pneumonia/MRSA pneumonia.  Resolved    Latest Ref Rng & Units 02/05/2023   11:12 AM 01/22/2023    6:36 AM 12/20/2022   10:54 AM  CBC  WBC 4.0 - 10.5 K/uL 8.5  8.1  5.6   Hemoglobin 12.0 - 15.0 g/dL 16.1  09.6  04.5   Hematocrit 36.0 - 46.0 % 46.7  47.5  42.8   Platelets 150 - 400 K/uL 284  274  253      9.  History of drug abuse.  Positive cocaine on urine drug screen.  Provide counseling  10.  AKI/hypovolemia and ATN.  resolved    Latest Ref Rng & Units 02/09/2023    5:57 AM 02/05/2023   11:12 AM 02/02/2023    5:30 AM  BMP  Glucose 70 - 99 mg/dL  409    BUN 6 - 20 mg/dL  13    Creatinine 8.11 - 1.00 mg/dL 9.14  7.82  9.56   Sodium 135 - 145 mmol/L  138    Potassium 3.5 - 5.1 mmol/L  4.0    Chloride 98 - 111 mmol/L  103    CO2 22 - 32 mmol/L  28    Calcium 8.9 - 10.3 mg/dL  9.0      11.  Mild transaminitis with rhabdomyolysis.  Both resolved  12.  Hypotension. Resolved, d/c midodrine  9/21- BP running 140s systolic-will con't to monitor   9/22- BP running 120s-140's over 90s- con't to monitor 13. Impaired initiation: continue amantadine 100mg  BID  14. Suboptimal vitamin D: continue 1,000U D3 daily  15. Spasticity:   -discussed serial casting with OT and her mom  - continue baclofen to 20mg  TID and tizanidine to 4mg  TID. Continue wrist, elbow brace, and PRAFOs. Ordered a night splint for LLE  Repeat CMP today given chronic tizanidine use  -9/7 continues to have significant tone despite use of tizanidine and baclofen, may be beneficial consider baclofen pump in outpatient setting  if this causes significant difficulties in care  9/8 consider decrease baclofen if dyskinetic movements continue  9/21- Wearing PRAFOs 16. Bowel and bladder  incontinence: continue bowel and bladder program   17. Left hand skin maceration: Eucerin ordered, discussed hand split with OT, conitnue  18. History of magnesium deficiency: magnesium 500mg  started HS    02/12/2023    8:54 AM 02/12/2023    3:34 AM 02/11/2023    8:16 PM  Vitals with BMI  Systolic 127 149 161  Diastolic 93 99 82  Pulse 86 90 77    19. HTN: see above Increase tizanidine to 6mg  TID. Magnesium supplement added HS. Increase propanolol to 20mg  TID     02/12/2023    8:54 AM 02/12/2023    3:34 AM 02/11/2023    8:16 PM  Vitals with BMI  Systolic 127 149 096  Diastolic 93 99 82  Pulse 86 90 77     20. Area of redness between left thumb and index finger: continue foam dressing  21. Scalp eczema: selsun blue ordered, continue  -9/7 patient's mother feels like this is causing her to itch, will start some hydrocortisone cream  22. Fatigue: improved, discussed with team may be secondary to anti-spasticity medications, B12 reviewed and is 335, in suboptimal range, will give 1,000U B12 injection 8/7, will order monthly as needed, metanx started, increase to BID, continue  23. Right wrist swelling: XR ordered and shows no acute fracture, shows 2.71mm ulnar variance, brace removed to given patient a break from it. Voltaren gel ordered prn for discomfort, continue, CT ordered and show no acute osseus injury, continue ace wrap  23. Dysphagia: continue D3/thins, d/c ensure as blood pressure severely elevates whenever restarted  24. MRSA nares positive: negative on repeat, precautions d/ced  25. Tachycardic: resolved, increase propanolol to BID.  continueTizanidine increased to 6mg  TID. Resolved, amantadine increased back to 100mg  BID, continue this dose  26.  Dyskinetic movements in head and neck - appears to be myoclonic jerks related to hypoxic brain injury - as discussed with pt mom, no specific meds for this on antispasticity meds , consioder benzo although with pt's SUD would  avoid this class Appear to have resolved, EEG reviewed and was negative for seizure.   27. Small sore on the base of 5th metatarsal head: local wound care, continue  28. Rash on face: resolved, discussed with mother could have been secondary to added sugars in nutritional supplements  29. Sacral pressure injury: air mattress ordered, Placed nursing order to offload q1 hour while awake, continue medihoney, wound care consulted, discussed with nursing that this is improving  30. Cervical extensor weakness: tried decreasing tizanidine but spasticity appears increased and BP elevated so will increase back to 6mg  TID, continue this dose  31. Low protein stores: prosource ordered, continue  32. Intermittent lethargy: labs reviewed and are stable, discussed with team may be secondary to anti-spasticity medications  33. Blood blister to left heel: continue foam heel protectors.    LOS: 86 days A FACE TO FACE EVALUATION WAS PERFORMED  Clint Bolder P Talaysia Pinheiro 02/12/2023, 10:28 AM

## 2023-02-12 NOTE — Progress Notes (Signed)
Speech Language Pathology Daily Session Note  Patient Details  Name: Nicole [name] MRN: [id number] Date of Birth: 06-03-1975  Today's Date: 02/12/2023 SLP Individual Time: 1000-1015 SLP Individual Time Calculation (min): 15 min and Today's Date: 02/12/2023 SLP Missed Time: 45 Minutes Missed Time Reason: Patient fatigue  Short Term Goals: Week 9: SLP Short Term Goal 1 (Week 9): Week 12 - Pt will present w/ appropriate response to functional stimuli (i.e. accept spoon, grasp basic item appropriately, response to tactile/auditory stimuli) 60% of the time w/ maxA multimodal cues SLP Short Term Goal 2 (Week 9): Week 12 - Pt will answer open ended questions w/ modA in 60% of opportunities SLP Short Term Goal 3 (Week 9): Week 12 - Pt will answer basic yes/no questions with modA in 70% of opportunities SLP Short Term Goal 4 (Week 9): Week 12 - Pt will utilize increased vocal intensity w/ maxA verbal cues to remain 70% intelligibile at phrase level  Skilled Therapeutic Interventions:  Pt was seen in am to address communication. Pt sleeping while sitting up in TIS WC upon SLP arrival. She awakened with moderate verbal cues and repositioning. She greeted clinician however, she quickly returned to sleep but awakened again with mod cues. Pt agreeable to session with SLP initiating session through watching a video of someone playing an acoustic guitar; one of pt's preferred activities PTA. Pt unable to remain alert and quickly fell back asleep. Once re alerted with verbal cues, SLP initiated more functional task of placing familiar objects in pt's hand. Given a hairbrush, pt began to brush her hair given mod verbal cues. SLP placed a toothbrush in pt's hand with max multimodal cues to brush teeth but no return demo. She answered basic yes/ no questions about functional objects with 50% acc. Pt noted to fall back asleep however, SLP unable to get patient to remain alert for >1-3 seconds in additional opportunities.  Thus, pt missed 45 minutes of treatment due to fatigue. SLP to make up minutes as schedule permits. Pt left reclined in TIS WC with call button within reach.  Pain Pain Assessment Pain Scale: Faces Faces Pain Scale: No hurt  Therapy/Group: Individual Therapy  [name] 02/12/2023, 10:49 AM

## 2023-02-12 NOTE — Progress Notes (Signed)
Physical Therapy Session Note  Patient Details  Name: Nicole [name] MRN: [id number] Date of Birth: [birth year]  Today's Date: 02/12/2023 PT Individual Time: 0900-0930 PT Individual Time Calculation (min): 30 min   Short Term Goals: Week 9: PT Short Term Goal 1 (Week 9): Pt will initiate bed mobility with totalA PT Short Term Goal 2 (Week 9): Pt will maintain unsupported sitting balance for 30 seconds without LOB PT Short Term Goal 3 (Week 9): Pt will complete bed<>chair transfers with no more than totalA  Skilled Therapeutic Interventions/Progress Updates:      Pt in bed to start - has ice pack on her L knee. Pain with PROM but not palpation. Team notified via secure chat, MD suggesting Voltaren which LPN is aware of.  Pt assisted to EOB with totalA, ++ time for processing and allowing understanding of instructions. Patient then completed dependent squat pivot into TIS w/c. In Rehab gym, assisted onto Corene Cornea from w/c height. + 2 assist for standing into perched position. Assist needed for foot placement and keeping knees in neutral aignment due to tendency to adduct. Had patient sit in perched position for weight bearing and trunk control training for ~3-4 minutes with CGA for safety.  Pt returned to TIS w/c with +2 assist from Perched position and then transported back to her room. Reclined in TIS with full lap tray for trunk and UE support. All needs met.    Therapy Documentation Precautions:  Precautions Precautions: Fall Precaution Comments: Contact precautions & delayed processing Required Braces or Orthoses: Other Brace Other Brace: B adjustable night splints; B elbow "cosey" splints; L palm protector; R mitt Restrictions Weight Bearing Restrictions: No General:       Therapy/Group: Individual Therapy  Orrin Brigham 02/12/2023, 7:43 AM

## 2023-02-12 NOTE — Plan of Care (Signed)
  Problem: Consults Goal: RH STROKE PATIENT EDUCATION Description: See Patient Education module for education specifics  Outcome: Progressing   Problem: RH BOWEL ELIMINATION Goal: RH STG MANAGE BOWEL WITH ASSISTANCE Description: STG Manage Bowel with mod I Assistance. Outcome: Progressing Goal: RH STG MANAGE BOWEL W/MEDICATION W/ASSISTANCE Description: STG Manage Bowel with Medication with mod I Assistance. Outcome: Progressing   Problem: RH BLADDER ELIMINATION Goal: RH STG MANAGE BLADDER WITH ASSISTANCE Description: STG Manage Bladder With toileting Assistance Outcome: Progressing   Problem: RH SKIN INTEGRITY Goal: RH STG MAINTAIN SKIN INTEGRITY WITH ASSISTANCE Description: STG Maintain Skin Integrity With min  Assistance. Outcome: Progressing   Problem: RH BOWEL ELIMINATION Goal: RH STG MANAGE BOWEL WITH ASSISTANCE Description: STG Manage Bowel with mod I Assistance. Outcome: Progressing   Problem: RH BOWEL ELIMINATION Goal: RH STG MANAGE BOWEL W/MEDICATION W/ASSISTANCE Description: STG Manage Bowel with Medication with mod I Assistance. Outcome: Progressing   Problem: RH BLADDER ELIMINATION Goal: RH STG MANAGE BLADDER WITH ASSISTANCE Description: STG Manage Bladder With toileting Assistance Outcome: Progressing   Problem: RH SKIN INTEGRITY Goal: RH STG MAINTAIN SKIN INTEGRITY WITH ASSISTANCE Description: STG Maintain Skin Integrity With min  Assistance. Outcome: Progressing

## 2023-02-12 NOTE — Progress Notes (Signed)
Occupational Therapy Session Note  Patient Details  Name: Nicole [name] MRN: [id number] Date of Birth: October 25, 1975  Today's Date: 02/12/2023 OT Individual Time: 0802-0830 OT Individual Time Calculation (min): 28 min    Short Term Goals: Week 13:  OT Short Term Goal 1 (Week 13): Patient will wash upper legs after set up and verbal cue with min assist OT Short Term Goal 2 (Week 13): Patient will reach toward lower leg and foot with min assist using RUE in prep for lower body self care OT Short Term Goal 3 (Week 13): Patient will utilize LUE for part of one functional task with min assist   Skilled Therapeutic Interventions/Progress Updates:    Patient received supine in bed.  Patient greeted therapist upon arrival, and when asked if she had pain patient was able to state - my knee - left knee.  Patient positioned in bed in flexion at hips and knees, and worked to gently stretch toward extension.  Assisted patient with rolling left and right to clean her and change into new brief, and pants.  Asked patient if she felt ice would be helpful and she said "I am up for that"  Ice applied to left knee.  Patient chose shirt for today and completed dressing in bed with total assist.  Patient able to hold bed rail with right hand when rolling left to help stabilize sidelying position.  Patient left in bed - with bed flat to promote extension.    Therapy Documentation Precautions:  Precautions Precautions: Fall Precaution Comments: Contact precautions & delayed processing Required Braces or Orthoses: Other Brace Other Brace: B adjustable night splints; B elbow "cosey" splints; L palm protector; R mitt Restrictions Weight Bearing Restrictions: No  Pain: Pain Assessment Pain Scale: Faces Faces Pain Scale: indicates left knee pain    Therapy/Group: Individual Therapy  [name] 02/12/2023, 12:13 PM

## 2023-02-13 MED ORDER — VITAMIN D 25 MCG (1000 UNIT) PO TABS
2000.0000 [IU] | ORAL_TABLET | Freq: Every day | ORAL | Status: DC
  Administered 2023-02-14: 2000 [IU] via ORAL
  Filled 2023-02-13: qty 2

## 2023-02-13 NOTE — Progress Notes (Signed)
PROGRESS NOTE   Subjective/Complaints: No new complaints this morning Tolerated therapy well Patient's chart reviewed- No issues reported overnight Vitals signs stable   ROS: Limited by cognition, +knee pain  Objective:   No results found. No results for input(s): "WBC", "HGB", "HCT", "PLT" in the last 72 hours.     No results for input(s): "NA", "K", "CL", "CO2", "GLUCOSE", "BUN", "CREATININE", "CALCIUM" in the last 72 hours.          Intake/Output Summary (Last 24 hours) at 02/13/2023 1221 Last data filed at 02/12/2023 1800 Gross per 24 hour  Intake 477 ml  Output --  Net 477 ml      Pressure Injury 10/31/22 Buttocks Left Unstageable - Full thickness tissue loss in which the base of the injury is covered by slough (yellow, tan, gray, green or brown) and/or eschar (tan, brown or black) in the wound bed. (Active)  10/31/22 2000  Location: Buttocks  Location Orientation: Left  Staging: Unstageable - Full thickness tissue loss in which the base of the injury is covered by slough (yellow, tan, gray, green or brown) and/or eschar (tan, brown or black) in the wound bed.  Wound Description (Comments):   Present on Admission: Yes     Pressure Injury 11/18/22 Toe (Comment  which one) Anterior;Right Deep Tissue Pressure Injury - Purple or maroon localized area of discolored intact skin or blood-filled blister due to damage of underlying soft tissue from pressure and/or shear. small are (Active)  11/18/22 1300  Location: Toe (Comment  which one)  Location Orientation: Anterior;Right  Staging: Deep Tissue Pressure Injury - Purple or maroon localized area of discolored intact skin or blood-filled blister due to damage of underlying soft tissue from pressure and/or shear.  Wound Description (Comments): small area on right great toe metatarsal  Present on Admission: Yes     Pressure Injury 02/09/23 Heel Left Deep Tissue  Pressure Injury - Purple or maroon localized area of discolored intact skin or blood-filled blister due to damage of underlying soft tissue from pressure and/or shear. Black/purple and boggy,  size of a quar (Active)  02/09/23 0543  Location: Heel  Location Orientation: Left  Staging: Deep Tissue Pressure Injury - Purple or maroon localized area of discolored intact skin or blood-filled blister due to damage of underlying soft tissue from pressure and/or shear.  Wound Description (Comments): Black/purple and boggy,  size of a quarter.  Foam dressing applied  Present on Admission: No    Physical Exam: Vital Signs Blood pressure (!) 112/99, pulse 91, temperature 98.4 F (36.9 C), temperature source Oral, resp. rate 18, height 5\' 1"  (1.549 m), weight 47.6 kg, SpO2 99%. Gen: no distress, normal appearing HEENT: oral mucosa pink and moist, NCAT Cardio: Reg rate Chest: normal effort, normal rate of breathing Abd: soft, non-distended Ext: no edema Psych: pleasant, normal affect  Skin: + Scaling dried skin on head present-2 small excoriations, sore on the base of the 5th metatarsal head, rash on face . stage 2 to right toe, area of redness between left thumb and index finger-covered in dressing, left buttock unstageable area not viewed +redness to bilateral elbows  Neurologic: Minimal engagement on exam, tired. Answers "  good morning"  Does not follow simple commands. Delayed processing  Tone:  MAS 1 right elbow,MAS 1 in RIght finger and wrist flexors  MAS 3 knee flexors, and MAS 3 L>R  hip adductors  MAS 3 L elbow , MAS 3-4 in L wrist and finger flexors  + LUE WHO  Motor 0/5 strength in LUE and BLE, Musculoskeletal: reduced bilateral shoulder and elbow ROM due to increased tone , pain with passive knee extension bilaterally  Right wrist swelling -Ace wrapped.   Assessment/Plan: 1. Functional deficits which require 3+ hours per day of interdisciplinary therapy in a comprehensive  inpatient rehab setting. Physiatrist is providing close team supervision and 24 hour management of active medical problems listed below. Physiatrist and rehab team continue to assess barriers to discharge/monitor patient progress toward functional and medical goals  Care Tool:  Bathing    Body parts bathed by patient: Face, Abdomen, Chest, Left upper leg, Right upper leg   Body parts bathed by helper: Right arm, Left arm, Front perineal area, Buttocks, Left lower leg, Right lower leg Body parts n/a: Right arm, Left arm, Front perineal area, Buttocks, Right upper leg, Left upper leg, Right lower leg, Left lower leg   Bathing assist Assist Level: Maximal Assistance - Patient 24 - 49%     Upper Body Dressing/Undressing Upper body dressing   What is the patient wearing?: Pull over shirt    Upper body assist Assist Level: Maximal Assistance - Patient 25 - 49%    Lower Body Dressing/Undressing Lower body dressing      What is the patient wearing?: Pants, Incontinence brief     Lower body assist Assist for lower body dressing: Dependent - Patient 0%     Toileting Toileting Toileting Activity did not occur (Clothing management and hygiene only): N/A (no void or bm)  Toileting assist Assist for toileting: Total Assistance - Patient < 25%     Transfers Chair/bed transfer  Transfers assist  Chair/bed transfer activity did not occur: Safety/medical concerns  Chair/bed transfer assist level: Total Assistance - Patient < 25%     Locomotion Ambulation   Ambulation assist   Ambulation activity did not occur: Safety/medical concerns  Assist level: 2 helpers   Max distance: 36ft   Walk 10 feet activity   Assist  Walk 10 feet activity did not occur: Safety/medical concerns  Assist level: 2 helpers     Walk 50 feet activity   Assist Walk 50 feet with 2 turns activity did not occur: Safety/medical concerns         Walk 150 feet activity   Assist Walk 150  feet activity did not occur: Safety/medical concerns         Walk 10 feet on uneven surface  activity   Assist Walk 10 feet on uneven surfaces activity did not occur: Safety/medical concerns         Wheelchair     Assist Is the patient using a wheelchair?: Yes Type of Wheelchair: Manual    Wheelchair assist level: Dependent - Patient 0% Max wheelchair distance: 71ft    Wheelchair 50 feet with 2 turns activity    Assist    Wheelchair 50 feet with 2 turns activity did not occur: Safety/medical concerns   Assist Level: Dependent - Patient 0%   Wheelchair 150 feet activity     Assist  Wheelchair 150 feet activity did not occur: Safety/medical concerns   Assist Level: Dependent - Patient 0%   Blood pressure (!) 112/99, pulse  91, temperature 98.4 F (36.9 C), temperature source Oral, resp. rate 18, height 5\' 1"  (1.549 m), weight 47.6 kg, SpO2 99%.  Medical Problem List and Plan: 1. 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 Code status  DNR Continue CIR PT, OT and SLP  2.  Impaired mobility: continue Lovenox, weekly creatinine ordered             -antiplatelet therapy: N/A  3. Knee paint: Tylenol as needed, voltaren gel prn  4. History of anxiety: discussed with her mom that she feels this was a big risk factor for her accident  5. Neuropsych/cognition: This patient is not capable of making decisions on her own behalf. 6. Left buttock unstageable ulcer.  continue  Medihoney to buttock wound daily cover with foam dressing.  Changing dressing every 3 days or as needed soiling   7. Fluids/Electrolytes/Nutrition: Routine in and outs with follow-up chemistries  -Eating 100% most meals with assist  8.  Cavitary right lower lobe pneumonia likely aspiration pneumonia/MRSA pneumonia.  Resolved    Latest Ref Rng & Units 02/05/2023   11:12 AM 01/22/2023    6:36 AM 12/20/2022   10:54 AM  CBC  WBC 4.0 - 10.5 K/uL 8.5  8.1  5.6   Hemoglobin 12.0 - 15.0 g/dL 82.9  56.2  13.0   Hematocrit 36.0 - 46.0 % 46.7  47.5  42.8   Platelets 150 - 400 K/uL 284  274  253      9.  History of drug abuse.  Positive cocaine on urine drug screen.  Provide counseling  10.  AKI/hypovolemia and ATN.  resolved    Latest Ref Rng & Units 02/09/2023    5:57 AM 02/05/2023   11:12 AM 02/02/2023    5:30 AM  BMP  Glucose 70 - 99 mg/dL  865    BUN 6 - 20 mg/dL  13    Creatinine 7.84 - 1.00 mg/dL 6.96  2.95  2.84   Sodium 135 - 145 mmol/L  138    Potassium 3.5 - 5.1 mmol/L  4.0    Chloride 98 - 111 mmol/L  103    CO2 22 - 32 mmol/L  28    Calcium 8.9 - 10.3 mg/dL  9.0      11.  Mild transaminitis with rhabdomyolysis.  Both resolved  12.  Hypotension. Resolved, d/c midodrine  9/21- BP running 140s systolic-will con't to monitor   9/22- BP running 120s-140's over 90s- con't to monitor 13. Impaired initiation: continue amantadine 100mg  BID  14. Suboptimal vitamin D: continue 1,000U D3 daily  15. Spasticity:   -discussed serial casting with OT and her mom  - continue baclofen to 20mg  TID and tizanidine to 4mg  TID. Continue wrist, elbow brace, and PRAFOs. Ordered a night splint for LLE  Repeat CMP today given chronic tizanidine use  -9/7 continues to have significant tone despite use of tizanidine and baclofen, may be beneficial consider baclofen  pump in outpatient setting if this causes significant difficulties in care  9/8 consider decrease baclofen if dyskinetic movements  continue  9/21- Wearing PRAFOs 16. Bowel and bladder incontinence: continue bowel and bladder program   17. Left hand skin maceration: Eucerin ordered, discussed hand split with OT, conitnue  18. History of magnesium deficiency: magnesium 500mg  started HS    02/13/2023    4:25 AM 02/12/2023    7:45 PM 02/12/2023   12:48 PM  Vitals with BMI  Systolic 112 111 604  Diastolic 99 82 90  Pulse 91 85 88    19. HTN: see above Increase tizanidine to 6mg  TID. Magnesium supplement added HS. Increase propanolol to 20mg  TID     02/13/2023    4:25 AM 02/12/2023    7:45 PM 02/12/2023   12:48 PM  Vitals with BMI  Systolic 112 111 540  Diastolic 99 82 90  Pulse 91 85 88     20. Area of redness between left thumb and index finger: continue foam dressing  21. Scalp eczema: selsun blue ordered, continue  -9/7 patient's mother feels like this is causing her to itch, will start some hydrocortisone cream  22. Fatigue: improved, discussed with team may be secondary to anti-spasticity medications, B12 reviewed and is 335, in suboptimal range, will give 1,000U B12 injection 8/7, will order monthly as needed, metanx started, increase to BID, continue  23. Right wrist swelling: XR ordered and shows no acute fracture, shows 2.12mm ulnar variance, brace removed to given patient a break from it. Voltaren gel ordered prn for discomfort, continue, CT ordered and show no acute osseus injury, continue ace wrap  23. Dysphagia: continue D3/thins, d/c ensure as blood pressure severely elevates whenever restarted  24. MRSA nares positive: negative on repeat, precautions d/ced  25. Tachycardic: resolved, increase propanolol to BID.  continueTizanidine increased to 6mg  TID. Resolved, amantadine increased back to 100mg  BID, continue this dose  26.  Dyskinetic movements in head and neck - appears to be myoclonic jerks related to hypoxic brain injury - as discussed with pt mom, no specific meds for this on  antispasticity meds , consioder benzo although with pt's SUD would avoid this class Appear to have resolved, EEG reviewed and was negative for seizure.   27. Small sore on the base of 5th metatarsal head: local wound care, continue  28. Rash on face: resolved, discussed with mother could have been secondary to added sugars in nutritional supplements  29. Sacral pressure injury: air mattress ordered, Placed nursing order to offload q1 hour while awake, continue medihoney, wound care consulted, discussed with nursing that this is improving  30. Cervical extensor weakness: tried decreasing tizanidine but spasticity appears increased and BP elevated so will increase back to 6mg  TID, continue this dose  31. Low protein stores: prosource ordered, continue  32. Intermittent lethargy: labs reviewed and are stable, discussed with team may be secondary to anti-spasticity medications  33. Blood blister to left heel: continue foam heel protectors.   34. Suboptimal vitamin D level: start D3 daily.    LOS: 87 days A FACE TO FACE EVALUATION WAS PERFORMED  Clint Bolder P Chadric Kimberley 02/13/2023, 12:21 PM

## 2023-02-13 NOTE — Progress Notes (Signed)
Physical Therapy Session Note  Patient Details  Name: Nicole [name] MRN: [id number] Date of Birth: February 09, 1976  Today's Date: 02/13/2023 PT Individual Time: 0930-1000 PT Individual Time Calculation (min): 30 min   Short Term Goals: Week 9: PT Short Term Goal 1 (Week 9): Pt will initiate bed mobility with totalA PT Short Term Goal 2 (Week 9): Pt will maintain unsupported sitting balance for 30 seconds without LOB PT Short Term Goal 3 (Week 9): Pt will complete bed<>chair transfers with no more than totalA  Skilled Therapeutic Interventions/Progress Updates:      Pt in bed on arrival - incontinent x2 with heavy brief and large BM. Dependent assist for posterior pericare at bed level, new brief donned. NT notified for charting purposes.   Pt continues to show pain to any PROM for her L knee, grimacing and even verbalizing the pain, but unrated. Tried to stretch her into extension and assist with positioning but pain was limiting.   Focused session on education to use call bell for needs/wants, especially for toileting. Pt able to purposefully push the red call button on call bell with cues but once her thumb is moved she has difficulty locating the button again. Applied a textured button to the call bell to help her in locating but also tried teaching her how to use the soft call bell which she found easier to press. However, she was not purposefully pressing it and would repeatedly press it, possibly purposefully as she was laughing about this. Patient continued to use call bell and nurse secretary notified of the situation.  Pt ended session in bed, all needs met.   Therapy Documentation Precautions:  Precautions Precautions: Fall Precaution Comments: Contact precautions & delayed processing Required Braces or Orthoses: Other Brace Other Brace: B adjustable night splints; B elbow "cosey" splints; L palm protector; R mitt Restrictions Weight Bearing Restrictions: No General:       Therapy/Group: Individual Therapy  Deneene Tarver P Alianis Trimmer 02/13/2023, 7:45 AM

## 2023-02-13 NOTE — Progress Notes (Signed)
Nutrition Follow-up  DOCUMENTATION CODES:   Severe malnutrition in context of acute illness/injury  INTERVENTION:  Double protein portions with meals 30 ml ProSource Plus BID, each supplement provides 100 kcals and 15 grams protein.  MVI with minerals daily Vitamin C 500mg  daily to promote wound healing  NUTRITION DIAGNOSIS:   Severe Malnutrition related to acute illness as evidenced by moderate fat depletion, moderate muscle depletion. - remains applicable  GOAL:   Patient will meet greater than or equal to 90% of their needs - goal met via meals, supplements and double protein portions  MONITOR:   PO intake, Supplement acceptance  REASON FOR ASSESSMENT:   Malnutrition Screening Tool    ASSESSMENT:   47 y.o. 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 PMH includes: anxiety, asthma, depression.  Spoke with pt at bedside. No family present. Pt with limited responses. Endorses having a good appetite and continues to eat all 3 meals. Tolerating prosource supplements without difficulty.   Not all meals documented however pt noted to have 100% of 7 recorded meals (09/29-10/2).   Will continue with current nutrition interventions.   Edema: non-pitting LUE, BLE  Medications:  Vitamin C, Vitamin D3, metanx, magonate, MVI  Labs: reviewed   Diet Order:   Diet Order             DIET DYS 3 Room service appropriate? Yes; Fluid consistency: Thin  Diet effective now                   EDUCATION NEEDS:   Not appropriate for education at this time  Skin:  Skin Assessment: Skin Integrity Issues: Skin Integrity Issues:: Unstageable, Other (Comment), DTI DTI: L heel Unstageable: left buttocks Other: non pressure wound to toe  Last BM:  10/1 (smear)  Height:   Ht Readings from Last 1 Encounters:  11/18/22 5\' 1"  (1.549 m)    Weight:   Wt Readings from Last 1  Encounters:  02/14/23 (P) 45.8 kg   BMI:  Body mass index is 19.08 kg/m (pended).  Estimated Nutritional Needs:   Kcal:  1600-1800  Protein:  80-100 gm  Fluid:  >/= 1.6 L  Drusilla Kanner, RDN, LDN Clinical Nutrition

## 2023-02-13 NOTE — Progress Notes (Signed)
Speech Language Pathology Daily Session Note  Patient Details  Name: Nicole [name] MRN: [id number] Date of Birth: 1975/06/16  Today's Date: 02/13/2023 SLP Individual Time: 1400-1425 SLP Individual Time Calculation (min): 25 min  Short Term Goals: Week 9: SLP Short Term Goal 1 (Week 9): Week 12 - Pt will present w/ appropriate response to functional stimuli (i.e. accept spoon, grasp basic item appropriately, response to tactile/auditory stimuli) 60% of the time w/ maxA multimodal cues SLP Short Term Goal 2 (Week 9): Week 12 - Pt will answer open ended questions w/ modA in 60% of opportunities SLP Short Term Goal 3 (Week 9): Week 12 - Pt will answer basic yes/no questions with modA in 70% of opportunities SLP Short Term Goal 4 (Week 9): Week 12 - Pt will utilize increased vocal intensity w/ maxA verbal cues to remain 70% intelligibile at phrase level  Skilled Therapeutic Interventions: Skilled therapy session focused on communication goals. SLP facilitated session by encouraging patient to utilize functional items. Patient able to appropriately utilize functional stimuli with minA. Patient utilized wet washcloth to wash face and brush to brush hair per SLP instructions. Patient answered biographical yes/no questions with minA and ~70% accuracy. Patient began to fatigue and fall asleep during open ended questions despite SLP tactile and verbal attempts to waken. SLP missed 5 minutes of session due to patient fatigue. Will attempt to make up minutes when therapy and patient schedule allows. Patient left in chair with alarm set and call bell in reach. Continue POC.   Pain No pain reported  Therapy/Group: Individual Therapy  Domingo Fuson M.A., CF-SLP 02/13/2023, 3:34 PM

## 2023-02-13 NOTE — Progress Notes (Signed)
Occupational Therapy Session Note  Patient Details  Name: Nicole [name] MRN: [id number] Date of Birth: 30-Sep-1975  Today's Date: 02/13/2023 OT Individual Time: 4098-1191 OT Individual Time Calculation (min): 42 min    Short Term Goals: Week 9: OT Short Term Goal 1 (Week 9): Patient will change orientation of toothbrush/ hairbrush to accomodate task with min assist after set up OT Short Term Goal 2 (Week 9): Patient will spear food x 3 once utensil in hand, then bring to mouth with only contact assistance  Skilled Therapeutic Interventions/Progress Updates:  Pt greeted supine in bed, pt agreeable to OT intervention. When asked about pain pt replies with, " define pain."      Transfers/bed mobility: pt rolled R<>L for dressing with overall MAX A, emphasis on initiating reaching during rolling. Pt completed supine>sit with total A +2. Worked on hooking UEs with therapist and tech to shift trunk forward during dressing tasks. Dependent transfer from EOB>TIS.   ADLs:  Grooming: pt completed oral care and hair brushing at sink with MAXA, pt able to engage in task with initial hand over hand assist. Difficulty terminating tasks once pt gets started.  UB dressing: total A to don [place] shirt from bed level, worked on shifting trunk anteriorly when donning shirt.  LB dressing: total A to don pants from bed level with pt rolling R<>L to pull pants to waist line.  Footwear: total A to don shoes from bed level   Ended session with pt seated in TIS with all needs within reach and safety belt alarm activated.                    Therapy Documentation Precautions:  Precautions Precautions: Fall Precaution Comments: Contact precautions & delayed processing Required Braces or Orthoses: Other Brace Other Brace: B adjustable night splints; B elbow "cosey" splints; L palm protector; R mitt Restrictions Weight Bearing Restrictions: No  Pain: Pt noted to grimace to pain in R knee, repositioned as needed  during session.     Therapy/Group: Individual Therapy  Barron Schmid 02/13/2023, 3:33 PM

## 2023-02-14 ENCOUNTER — Inpatient Hospital Stay (HOSPITAL_COMMUNITY): Payer: MEDICAID

## 2023-02-14 MED ORDER — VITAMIN D 25 MCG (1000 UNIT) PO TABS
3000.0000 [IU] | ORAL_TABLET | Freq: Every day | ORAL | Status: DC
  Administered 2023-02-15: 3000 [IU] via ORAL
  Filled 2023-02-14: qty 3

## 2023-02-14 MED ORDER — DICLOFENAC SODIUM 1 % EX GEL
2.0000 g | Freq: Four times a day (QID) | CUTANEOUS | Status: DC
  Administered 2023-02-14 – 2023-05-09 (×229): 2 g via TOPICAL
  Filled 2023-02-14 (×3): qty 100

## 2023-02-14 NOTE — Progress Notes (Signed)
Occupational Therapy Session Note  Patient Details  Name: Nicole [name] MRN: [id number] Date of Birth: 01-14-1976  Today's Date: 02/14/2023 OT Individual Time: 1130-1210 OT Individual Time Calculation (min): 40 min    Short Term Goals: Week 13:   OT Short Term Goal 1 (Week 13): Patient will wash upper legs after set up and verbal cue with min assist OT Short Term Goal 2 (Week 13): Patient will reach toward lower leg and foot with min assist using RUE in prep for lower body self care OT Short Term Goal 3 (Week 13): Patient will utilize LUE for part of one functional task with min assist   Skilled Therapeutic Interventions/Progress Updates:    Patient received curled into flexion in partial sidelying.  Patient's brief soaked and soiled.  Patient sweating in bed.  Patient assisted to roll left/right to get cleaned up and into dry brief.  New dressing applied to sacral area due to soiled dressing.  Patient able to choose shirt verbally.  Patient reports pain in both knees today with motion required for dressing, e.g. gentle knee flex/ext.  Assisted to sitting with total assist and transferred to wheelchair.  Patient washed face, and chest with set up and hand over hand facilitation to start task.  Helped patient to don clean shirt.  Left up in wheelchair slightly tilted with lap tray in place, pillow to supprt Ue's and L resting hand splint in place.  Call bell on tray.    Therapy Documentation Precautions:  Precautions Precautions: Fall Precaution Comments: Contact precautions & delayed processing Required Braces or Orthoses: Other Brace Other Brace: B adjustable night splints; B elbow "cosey" splints; L palm protector; R mitt Restrictions Weight Bearing Restrictions: No   Pain: Pain Assessment Pain Scale: 0-10 Pain Score: 0-No pain      Therapy/Group: Individual Therapy  [name] 02/14/2023, 12:22 PM

## 2023-02-14 NOTE — Progress Notes (Signed)
Speech Language Pathology Daily Session Note  Patient Details  Name: ARIANIE COUSE MRN: [id number] Date of Birth: 11-05-1975  Today's Date: 02/14/2023 SLP Individual Time: 1450-1530 SLP Individual Time Calculation (min): 40 min  Short Term Goals: Week 9: SLP Short Term Goal 1 (Week 9): Week 12 - Pt will present w/ appropriate response to functional stimuli (i.e. accept spoon, grasp basic item appropriately, response to tactile/auditory stimuli) 60% of the time w/ maxA multimodal cues SLP Short Term Goal 2 (Week 9): Week 12 - Pt will answer open ended questions w/ modA in 60% of opportunities SLP Short Term Goal 3 (Week 9): Week 12 - Pt will answer basic yes/no questions with modA in 70% of opportunities SLP Short Term Goal 4 (Week 9): Week 12 - Pt will utilize increased vocal intensity w/ maxA verbal cues to remain 70% intelligibile at phrase level  Skilled Therapeutic Interventions: SLP conducted skilled therapy session targeting cognitive retraining and communication goals. Patient exhibited variable participation throughout session, seemingly internally distracted and/or externally distracted by hand/wrist brace. SLP and patient discussed patient's likes/dislikes, where she has lived, and discussed current date. Patient stating month is July, reoriented by SLP utilizing calendar with patient then stating 'October' when given visual aid. During communication tasks, patient required totalA to sustain attention to task. Once attention was garnered, patient provided answers to open ended questions in 50% of opportunities given mod-max multimodal cues. Patient answered basic yes/no questions in 70% of opportunities given modA. SLP provided max cues to increase vocal intensity throughout, however patient seemingly not stimulable to increase vocal intensity at this time. Patient left in TIS chair with mother at bedside, drinking milkshake. SLP will continue with POC as indicated.      Pain Pain  Assessment Pain Scale: Faces Faces Pain Scale: Hurts a little bit Pain Location: Knee  Therapy/Group: Individual Therapy  [name], M.A., CCC-SLP  [name] 02/14/2023, 3:55 PM

## 2023-02-14 NOTE — Progress Notes (Signed)
Physical Therapy Session Note  Patient Details  Name: Nicole [name] MRN: [id number] Date of Birth: 06-16-1975  Today's Date: 02/14/2023 PT Individual Time: 1415-1445 PT Individual Time Calculation (min): 30 min   Short Term Goals: Week 9: PT Short Term Goal 1 (Week 9): Pt will initiate bed mobility with totalA PT Short Term Goal 2 (Week 9): Pt will maintain unsupported sitting balance for 30 seconds without LOB PT Short Term Goal 3 (Week 9): Pt will complete bed<>chair transfers with no more than totalA  Skilled Therapeutic Interventions/Progress Updates:      Pt sitting in TIS w/c to start. Rep, CJ, from [employer], present to trial estim for pain control to her L knee. Pt seemed to tolerate well but difficult to discern level of pain improvement due to her speech/language deficits.   Pt transported in w/c to day room rehab gym. Dependent squat pivot transfer onto mat table. +2 assist for initial sitting balance for trunk and posterior support - rigid posterior tilt in sitting which was difficult to correct unless externally supported. Patient in short sitting trying to work unsupported sitting which she was able to get short periods (10-15s) of CGA for sitting balance! Continues to be very delayed with any righting responses or protective responses.   Pt returned to her room and ended session reclined in TIS chair, all needs met, her mother present at the bedside.  Therapy Documentation Precautions:  Precautions Precautions: Fall Precaution Comments: Contact precautions & delayed processing Required Braces or Orthoses: Other Brace Other Brace: B adjustable night splints; B elbow "cosey" splints; L palm protector; R mitt Restrictions Weight Bearing Restrictions: No General:      Therapy/Group: Individual Therapy  Orrin Brigham 02/14/2023, 7:53 AM

## 2023-02-14 NOTE — Progress Notes (Signed)
PROGRESS NOTE   Subjective/Complaints: No new complaints this morning Tolerated therapy well Patient's chart reviewed- No issues reported overnight Vitals signs stable   ROS: Limited by cognition, +knee pain  Objective:   No results found. No results for input(s): "WBC", "HGB", "HCT", "PLT" in the last 72 hours.     No results for input(s): "NA", "K", "CL", "CO2", "GLUCOSE", "BUN", "CREATININE", "CALCIUM" in the last 72 hours.          Intake/Output Summary (Last 24 hours) at 02/14/2023 1129 Last data filed at 02/14/2023 9562 Gross per 24 hour  Intake 480 ml  Output --  Net 480 ml      Pressure Injury 10/31/22 Buttocks Left Unstageable - Full thickness tissue loss in which the base of the injury is covered by slough (yellow, tan, gray, green or brown) and/or eschar (tan, brown or black) in the wound bed. (Active)  10/31/22 2000  Location: Buttocks  Location Orientation: Left  Staging: Unstageable - Full thickness tissue loss in which the base of the injury is covered by slough (yellow, tan, gray, green or brown) and/or eschar (tan, brown or black) in the wound bed.  Wound Description (Comments):   Present on Admission: Yes     Pressure Injury 11/18/22 Toe (Comment  which one) Anterior;Right Deep Tissue Pressure Injury - Purple or maroon localized area of discolored intact skin or blood-filled blister due to damage of underlying soft tissue from pressure and/or shear. small are (Active)  11/18/22 1300  Location: Toe (Comment  which one)  Location Orientation: Anterior;Right  Staging: Deep Tissue Pressure Injury - Purple or maroon localized area of discolored intact skin or blood-filled blister due to damage of underlying soft tissue from pressure and/or shear.  Wound Description (Comments): small area on right great toe metatarsal  Present on Admission: Yes     Pressure Injury 02/09/23 Heel Left Deep Tissue  Pressure Injury - Purple or maroon localized area of discolored intact skin or blood-filled blister due to damage of underlying soft tissue from pressure and/or shear. Black/purple and boggy,  size of a quar (Active)  02/09/23 0543  Location: Heel  Location Orientation: Left  Staging: Deep Tissue Pressure Injury - Purple or maroon localized area of discolored intact skin or blood-filled blister due to damage of underlying soft tissue from pressure and/or shear.  Wound Description (Comments): Black/purple and boggy,  size of a quarter.  Foam dressing applied  Present on Admission: No    Physical Exam: Vital Signs Blood pressure (!) 157/84, pulse 79, temperature 98.5 F (36.9 C), temperature source Oral, resp. rate 18, height 5\' 1"  (1.549 m), weight (P) 45.8 kg, SpO2 96%. Gen: no distress, normal appearing HEENT: oral mucosa pink and moist, NCAT Cardio: Reg rate Chest: normal effort, normal rate of breathing Abd: soft, non-distended Ext: no edema Psych: pleasant, normal affect  Skin: + Scaling dried skin on head present-2 small excoriations, sore on the base of the 5th metatarsal head, rash on face . stage 2 to right toe, area of redness between left thumb and index finger-covered in dressing, left buttock unstageable area not viewed +redness to bilateral elbows  Neurologic: Minimal engagement on exam, tired.  Answers "good morning"  Does not follow simple commands. Delayed processing  Tone:  MAS 1 right elbow,MAS 1 in RIght finger and wrist flexors  MAS 3 knee flexors, and MAS 3 L>R  hip adductors  MAS 3 L elbow , MAS 3-4 in L wrist and finger flexors  + LUE WHO  Motor 0/5 strength in LUE and BLE, Musculoskeletal: reduced bilateral shoulder and elbow ROM due to increased tone , pain with passive knee extension bilaterally  Right wrist swelling -Ace wrapped.   Assessment/Plan: 1. Functional deficits which require 3+ hours per day of interdisciplinary therapy in a comprehensive  inpatient rehab setting. Physiatrist is providing close team supervision and 24 hour management of active medical problems listed below. Physiatrist and rehab team continue to assess barriers to discharge/monitor patient progress toward functional and medical goals  Care Tool:  Bathing    Body parts bathed by patient: Face, Abdomen, Chest, Left upper leg, Right upper leg   Body parts bathed by helper: Right arm, Left arm, Front perineal area, Buttocks, Left lower leg, Right lower leg Body parts n/a: Right arm, Left arm, Front perineal area, Buttocks, Right upper leg, Left upper leg, Right lower leg, Left lower leg   Bathing assist Assist Level: Maximal Assistance - Patient 24 - 49%     Upper Body Dressing/Undressing Upper body dressing   What is the patient wearing?: Pull over shirt    Upper body assist Assist Level: Maximal Assistance - Patient 25 - 49%    Lower Body Dressing/Undressing Lower body dressing      What is the patient wearing?: Pants, Incontinence brief     Lower body assist Assist for lower body dressing: Dependent - Patient 0%     Toileting Toileting Toileting Activity did not occur (Clothing management and hygiene only): N/A (no void or bm)  Toileting assist Assist for toileting: Total Assistance - Patient < 25%     Transfers Chair/bed transfer  Transfers assist  Chair/bed transfer activity did not occur: Safety/medical concerns  Chair/bed transfer assist level: Total Assistance - Patient < 25%     Locomotion Ambulation   Ambulation assist   Ambulation activity did not occur: Safety/medical concerns  Assist level: 2 helpers   Max distance: 58ft   Walk 10 feet activity   Assist  Walk 10 feet activity did not occur: Safety/medical concerns  Assist level: 2 helpers     Walk 50 feet activity   Assist Walk 50 feet with 2 turns activity did not occur: Safety/medical concerns         Walk 150 feet activity   Assist Walk 150  feet activity did not occur: Safety/medical concerns         Walk 10 feet on uneven surface  activity   Assist Walk 10 feet on uneven surfaces activity did not occur: Safety/medical concerns         Wheelchair     Assist Is the patient using a wheelchair?: Yes Type of Wheelchair: Manual    Wheelchair assist level: Dependent - Patient 0% Max wheelchair distance: 34ft    Wheelchair 50 feet with 2 turns activity    Assist    Wheelchair 50 feet with 2 turns activity did not occur: Safety/medical concerns   Assist Level: Dependent - Patient 0%   Wheelchair 150 feet activity     Assist  Wheelchair 150 feet activity did not occur: Safety/medical concerns   Assist Level: Dependent - Patient 0%   Blood pressure (!) 157/84,  pulse 79, temperature 98.5 F (36.9 C), temperature source Oral, resp. rate 18, height 5\' 1"  (1.549 m), weight (P) 45.8 kg, SpO2 96%.  Medical Problem List and Plan: 1. 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 Code status  DNR Continue CIR PT, OT and SLP  2.  Impaired mobility: continue Lovenox, weekly creatinine ordered             -antiplatelet therapy: N/A  3. Knee paint: Tylenol as needed, voltaren gel prn\\Prescribing Home [employer]nex NexWave Stimulator Device and supplies as needed. IFC, NMES and TENS medically necessary Treatment Rx: Daily @ 30-40 minutes per treatment PRN. [employer]nex NexWave only, no substitutions. Treatment Goals: 1) To reduce and/or  eliminate pain 2) To improve functional capacity and Activities of daily living 3) To reduce or prevent the need for oral medications 4) To improve circulation in the injured region 5) To decrease or prevent muscle spasm and muscle atrophy 6) To provide a self-management tool to the patient The patient has not sufficiently improved with conservative care. Numerous studies indexed by Medline and PubMed.gov have shown Neuromuscular, Interferential, and TENS stimulators to reduce pain, improve function, and reduce medication use in injured patients. Continued use of this evidence based, safe, drug free treatment is both reasonable and medically necessary at this time.   Scheduled voltaren gel  4. History of anxiety: discussed with her mom that she feels this was a big risk factor for her accident  5. Neuropsych/cognition: This patient is not capable of making decisions on her own behalf. 6. Left buttock unstageable ulcer.  continue Medihoney to buttock wound daily cover with foam dressing.  Changing dressing every 3 days or as needed soiling   7. Fluids/Electrolytes/Nutrition: Routine in and outs with follow-up chemistries  -Eating 100% most meals with assist  8.  Cavitary right lower lobe pneumonia likely aspiration pneumonia/MRSA pneumonia.  Resolved    Latest Ref Rng & Units 02/05/2023   11:12 AM 01/22/2023    6:36 AM 12/20/2022   10:54 AM  CBC  WBC 4.0 - 10.5 K/uL 8.5  8.1  5.6   Hemoglobin 12.0 - 15.0 g/dL 40.9  81.1  91.4   Hematocrit 36.0 - 46.0 % 46.7  47.5  42.8   Platelets 150 - 400 K/uL 284  274  253      9.  History of drug abuse.  Positive cocaine on urine drug screen.  Provide counseling  10.  AKI/hypovolemia and ATN.  resolved    Latest Ref Rng & Units 02/09/2023    5:57 AM 02/05/2023   11:12 AM 02/02/2023    5:30 AM  BMP  Glucose 70 - 99 mg/dL  782    BUN 6 - 20 mg/dL  13    Creatinine 9.56 - 1.00 mg/dL 2.13  0.86  5.78   Sodium 135 - 145 mmol/L  138    Potassium 3.5 -  5.1 mmol/L  4.0    Chloride  98 - 111 mmol/L  103    CO2 22 - 32 mmol/L  28    Calcium 8.9 - 10.3 mg/dL  9.0      11.  Mild transaminitis with rhabdomyolysis.  Both resolved  12.  Hypotension. Resolved, d/c midodrine  9/21- BP running 140s systolic-will con't to monitor   9/22- BP running 120s-140's over 90s- con't to monitor 13. Impaired initiation: continue amantadine 100mg  BID  14. Suboptimal vitamin D: continue 1,000U D3 daily  15. Spasticity:   -discussed serial casting with OT and her mom  - continue baclofen to 20mg  TID and tizanidine to 4mg  TID. Continue wrist, elbow brace, and PRAFOs. Ordered a night splint for LLE  Repeat CMP today given chronic tizanidine use  -9/7 continues to have significant tone despite use of tizanidine and baclofen, may be beneficial consider baclofen pump in outpatient setting if this causes significant difficulties in care  9/8 consider decrease baclofen if dyskinetic movements continue  9/21- Wearing PRAFOs 16. Bowel and bladder incontinence: continue bowel and bladder program   17. Left hand skin maceration: Eucerin ordered, discussed hand split with OT, conitnue  18. History of magnesium deficiency: magnesium 500mg  started HS    02/14/2023    9:00 AM 02/14/2023    5:02 AM 02/13/2023    7:38 PM  Vitals with BMI  Weight 101 lbs    Systolic  157 123  Diastolic  84 79  Pulse  79 90    19. HTN: see above Increase tizanidine to 6mg  TID. Magnesium supplement added HS. Increase propanolol to 20mg  TID     02/14/2023    9:00 AM 02/14/2023    5:02 AM 02/13/2023    7:38 PM  Vitals with BMI  Weight 101 lbs    Systolic  157 123  Diastolic  84 79  Pulse  79 90     20. Area of redness between left thumb and index finger: continue foam dressing  21. Scalp eczema: selsun blue ordered, continue  -9/7 patient's mother feels like this is causing her to itch, will start some hydrocortisone cream  22. Fatigue: improved, discussed with team may be  secondary to anti-spasticity medications, B12 reviewed and is 335, in suboptimal range, will give 1,000U B12 injection 8/7, will order monthly as needed, metanx started, increase to BID, continue  23. Right wrist swelling: XR ordered and shows no acute fracture, shows 2.64mm ulnar variance, brace removed to given patient a break from it. Voltaren gel ordered prn for discomfort, continue, CT ordered and show no acute osseus injury, continue ace wrap  23. Dysphagia: continue D3/thins, d/c ensure as blood pressure severely elevates whenever restarted  24. MRSA nares positive: negative on repeat, precautions d/ced  25. Tachycardic: resolved, increase propanolol to BID.  continueTizanidine increased to 6mg  TID. Resolved, amantadine increased back to 100mg  BID, continue this dose  26.  Dyskinetic movements in head and neck - appears to be myoclonic jerks related to hypoxic brain injury - as discussed with pt mom, no specific meds for this on antispasticity meds , consioder benzo although with pt's SUD would avoid this class Appear to have resolved, EEG reviewed and was negative for seizure.   27. Small sore on the base of 5th metatarsal head: local wound care, continue  28. Rash on face: resolved, discussed with mother could have been secondary to added sugars in nutritional supplements  29. Sacral pressure injury: air mattress ordered, Placed nursing order to offload q1 hour while awake, continue  medihoney, wound care consulted, discussed with nursing that this is improving  30. Cervical extensor weakness: tried decreasing tizanidine but spasticity appears increased and BP elevated so will increase back to 6mg  TID, continue this dose  31. Low protein stores: prosource ordered, continue  32. Intermittent lethargy: labs reviewed and are stable, discussed with team may be secondary to anti-spasticity medications  33. Blood blister to left heel: continue foam heel protectors.   34. Suboptimal  vitamin D level: increase 3,000 U daily.    LOS: 88 days A FACE TO FACE EVALUATION WAS PERFORMED  Chad Donoghue P Roshanda Balazs 02/14/2023, 11:29 AM

## 2023-02-15 MED ORDER — VITAMIN D 25 MCG (1000 UNIT) PO TABS
4000.0000 [IU] | ORAL_TABLET | Freq: Every day | ORAL | Status: DC
  Administered 2023-02-16: 4000 [IU] via ORAL
  Filled 2023-02-15: qty 4

## 2023-02-15 NOTE — Progress Notes (Signed)
Patient ID: Nicole [name], female   DOB: [birth year], 47 y.o.   MRN: [id number] Met with mom to give team conference update and progress toward her long term care medicaid status. Currently still waiting for approval.

## 2023-02-15 NOTE — Patient Care Conference (Signed)
Inpatient RehabilitationTeam Conference and Plan of Care Update Date: 02/14/2023   Time: 11:24 AM    Patient Name: Nicole [name]      Medical Record Number: [id number]  Date of Birth: [birth year] Sex: Female         Room/Bed: 4W09C/4W09C-01 Payor Info: Payor: [employer] / Plan: [employer] / Product Type: *No Product type* /    Admit Date/Time:  11/18/2022  3:15 PM  Primary Diagnosis:  Hypoxic brain injury [facility])  Hospital Problems: Principal Problem:   Hypoxic brain injury (HCC) Active Problems:   Protein-calorie malnutrition, severe   Essential hypertension   Pressure injury of skin    Expected Discharge Date: Expected Discharge Date:  (SNF pending)  Team Members Present: Physician leading conference: Dr. Sula Soda Social Worker Present: Dossie Der, LCSW Nurse Present: Chana Bode, RN;Other (comment) Janina Mayo) PT Present: Wynelle Link, PT OT Present: Bretta Bang, OT SLP Present: [name], SLP     Current Status/Progress Goal Weekly Team Focus  Bowel/Bladder   Pt is incontinent of b/b. LBM: 10/1   Develop habits of continence by timed voiding.   Assist w/ toileting needs q 2-4 hours and as needed.    Swallow/Nutrition/ Hydration               ADL's   dependent to max assist with basic self care skills   max assist   Postural control, pain management, participation    Mobility               Communication   answer biographical and functional yes/no with ~60% accuracy modA, answers open ended questions with ~50% accuracy modA   modA   continue to increase accuracy when answering open ended questions, yes/no questions, initiating communication and engaging in automatic speech tasks    Safety/Cognition/ Behavioral Observations  orientation deficits, poor sustained attention, increasing accuracy of recall biographical and functional information   answer Y/N questions to ID orientation information w/ maxA    sustained attention, orientation using y/n questions, alertness    Pain   Pt c/o bilat. leg pain rating it as a 6.5/10. Tylenol was given.   Pt will rate pain < 4/10. Pain will be managed by PRN pain medication if needed.   Assess pain q shift & PRN.    Skin   Healed pressure ulcer on buttocks w/ foam dressing covering. L heel DTI - foam in place. R foot blister wound - foam in place.   [name] wounds will heal in a timely manner w/o complications.  Assess skin q shift & PRN.      Discharge Planning:  Continue to await long term care Medicaid approval. Mom visits every other day.   Team Discussion: Patient continues to have left knee pain; MD ordering xray and scheduling Voltaren gel and patient does not consistently acknowledge need for medication.  Continue working on attention but is more accurate with biographical information communication.  Patient on target to meet rehab goals: Functionally remains dependent for care and needs mod assist for yes/no accuracy.  *See Care Plan and progress notes for long and short-term goals.   Revisions to Treatment Plan:  Trial Next Wave TENs unit   Teaching Needs: Safety, skin care, medications, transfers, toileting, etc.   Current Barriers to Discharge: Insurance for SNF coverage and awaiting long term medicaid approval  Possible Resolutions to Barriers: SNFpending     Medical Summary Current Status: left knee pain, sacral pressure injury, suboptimal vitamin D  level, spasticity, sarcopenia, hypertension  Barriers to Discharge: Medical stability  Barriers to Discharge Comments: left knee pain, sacral pressure injury, suboptimal vitamin D level, spasticity, sarcopenia, hypertension Possible Resolutions to [employer] Focus: scheduled voltaren gel, conitnue protein supplement vitamin D supplement started, trial of tens unit, continue to monitor blood pressure TID, continue baclofen and tizanidine, continue local wound  care   Continued Need for Acute Rehabilitation Level of Care: The patient requires daily medical management by a physician with specialized training in physical medicine and rehabilitation for the following reasons: Direction of a multidisciplinary physical rehabilitation program to maximize functional independence : Yes Medical management of patient stability for increased activity during participation in an intensive rehabilitation regime.: Yes Analysis of laboratory values and/or radiology reports with any subsequent need for medication adjustment and/or medical intervention. : Yes   I attest that I was present, lead the team conference, and concur with the assessment and plan of the team.   Chana Bode B 02/15/2023, 9:24 AM

## 2023-02-15 NOTE — Progress Notes (Signed)
PROGRESS NOTE   Subjective/Complaints: No new complaints this morning Left knee shows no new abnormalities.  Continuing to progress with SLP No issues overnight  ROS: Limited by cognition, +knee pain  Objective:   DG Knee 1-2 Views Left  Result Date: 02/14/2023 CLINICAL DATA:  Left knee pain, initial encounter EXAM: LEFT KNEE - 2 VIEW COMPARISON:  None Available. FINDINGS: No evidence of fracture, dislocation, or joint effusion. No evidence of arthropathy or other focal bone abnormality. Soft tissues are unremarkable. IMPRESSION: No acute abnormality noted. Electronically Signed   By: Alcide Clever M.D.   On: 02/14/2023 22:47   No results for input(s): "WBC", "HGB", "HCT", "PLT" in the last 72 hours.     No results for input(s): "NA", "K", "CL", "CO2", "GLUCOSE", "BUN", "CREATININE", "CALCIUM" in the last 72 hours.          Intake/Output Summary (Last 24 hours) at 02/15/2023 1029 Last data filed at 02/15/2023 0800 Gross per 24 hour  Intake 714 ml  Output --  Net 714 ml      Pressure Injury 10/31/22 Buttocks Left Unstageable - Full thickness tissue loss in which the base of the injury is covered by slough (yellow, tan, gray, green or brown) and/or eschar (tan, brown or black) in the wound bed. (Active)  10/31/22 2000  Location: Buttocks  Location Orientation: Left  Staging: Unstageable - Full thickness tissue loss in which the base of the injury is covered by slough (yellow, tan, gray, green or brown) and/or eschar (tan, brown or black) in the wound bed.  Wound Description (Comments):   Present on Admission: Yes     Pressure Injury 11/18/22 Toe (Comment  which one) Anterior;Right Deep Tissue Pressure Injury - Purple or maroon localized area of discolored intact skin or blood-filled blister due to damage of underlying soft tissue from pressure and/or shear. small are (Active)  11/18/22 1300  Location: Toe (Comment   which one)  Location Orientation: Anterior;Right  Staging: Deep Tissue Pressure Injury - Purple or maroon localized area of discolored intact skin or blood-filled blister due to damage of underlying soft tissue from pressure and/or shear.  Wound Description (Comments): small area on right great toe metatarsal  Present on Admission: Yes     Pressure Injury 02/09/23 Heel Left Deep Tissue Pressure Injury - Purple or maroon localized area of discolored intact skin or blood-filled blister due to damage of underlying soft tissue from pressure and/or shear. Black/purple and boggy,  size of a quar (Active)  02/09/23 0543  Location: Heel  Location Orientation: Left  Staging: Deep Tissue Pressure Injury - Purple or maroon localized area of discolored intact skin or blood-filled blister due to damage of underlying soft tissue from pressure and/or shear.  Wound Description (Comments): Black/purple and boggy,  size of a quarter.  Foam dressing applied  Present on Admission: No    Physical Exam: Vital Signs Blood pressure 113/87, pulse 99, temperature 98.3 F (36.8 C), resp. rate 18, height 5\' 1"  (1.549 m), weight 45.8 kg, SpO2 97%. Gen: no distress, normal appearing HEENT: oral mucosa pink and moist, NCAT Cardio: Reg rate Chest: normal effort, normal rate of breathing Abd: soft, non-distended Ext:  no edema Psych: pleasant, normal affect  Skin: + Scaling dried skin on head present-2 small excoriations, sore on the base of the 5th metatarsal head, rash on face . stage 2 to right toe, area of redness between left thumb and index finger-covered in dressing, left buttock unstageable area not viewed +redness to bilateral elbows  Neurologic: Minimal engagement on exam, tired. Answers "good morning"  Does not follow simple commands. Delayed processing  Tone:  MAS 1 right elbow,MAS 1 in RIght finger and wrist flexors  MAS 3 knee flexors, and MAS 3 L>R  hip adductors  MAS 3 L elbow , MAS 3-4 in L wrist  and finger flexors  + LUE WHO  Motor 0/5 strength in LUE and BLE, Musculoskeletal: reduced bilateral shoulder and elbow ROM due to increased tone , pain with passive knee extension bilaterally  Right wrist swelling -Ace wrapped.   Assessment/Plan: 1. Functional deficits which require 3+ hours per day of interdisciplinary therapy in a comprehensive inpatient rehab setting. Physiatrist is providing close team supervision and 24 hour management of active medical problems listed below. Physiatrist and rehab team continue to assess barriers to discharge/monitor patient progress toward functional and medical goals  Care Tool:  Bathing    Body parts bathed by patient: Face, Abdomen, Chest, Left upper leg, Right upper leg   Body parts bathed by helper: Right arm, Left arm, Front perineal area, Buttocks, Left lower leg, Right lower leg Body parts n/a: Right arm, Left arm, Front perineal area, Buttocks, Right upper leg, Left upper leg, Right lower leg, Left lower leg   Bathing assist Assist Level: Maximal Assistance - Patient 24 - 49%     Upper Body Dressing/Undressing Upper body dressing   What is the patient wearing?: Pull over shirt    Upper body assist Assist Level: Maximal Assistance - Patient 25 - 49%    Lower Body Dressing/Undressing Lower body dressing      What is the patient wearing?: Pants, Incontinence brief     Lower body assist Assist for lower body dressing: Dependent - Patient 0%     Toileting Toileting Toileting Activity did not occur (Clothing management and hygiene only): N/A (no void or bm)  Toileting assist Assist for toileting: Total Assistance - Patient < 25%     Transfers Chair/bed transfer  Transfers assist  Chair/bed transfer activity did not occur: Safety/medical concerns  Chair/bed transfer assist level: Total Assistance - Patient < 25%     Locomotion Ambulation   Ambulation assist   Ambulation activity did not occur: Safety/medical  concerns  Assist level: 2 helpers   Max distance: 58ft   Walk 10 feet activity   Assist  Walk 10 feet activity did not occur: Safety/medical concerns  Assist level: 2 helpers     Walk 50 feet activity   Assist Walk 50 feet with 2 turns activity did not occur: Safety/medical concerns         Walk 150 feet activity   Assist Walk 150 feet activity did not occur: Safety/medical concerns         Walk 10 feet on uneven surface  activity   Assist Walk 10 feet on uneven surfaces activity did not occur: Safety/medical concerns         Wheelchair     Assist Is the patient using a wheelchair?: Yes Type of Wheelchair: Manual    Wheelchair assist level: Dependent - Patient 0% Max wheelchair distance: 25ft    Wheelchair 50 feet with 2 turns  activity    Assist    Wheelchair 50 feet with 2 turns activity did not occur: Safety/medical concerns   Assist Level: Dependent - Patient 0%   Wheelchair 150 feet activity     Assist  Wheelchair 150 feet activity did not occur: Safety/medical concerns   Assist Level: Dependent - Patient 0%   Blood pressure 113/87, pulse 99, temperature 98.3 F (36.8 C), resp. rate 18, height 5\' 1"  (1.549 m), weight 45.8 kg, SpO2 97%.  Medical Problem List and Plan: 1. 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 Code status  DNR Continue CIR PT, OT and SLP  2.  Impaired mobility: continue Lovenox, weekly creatinine ordered              -antiplatelet therapy: N/A  3. Knee paint: Tylenol as needed, voltaren gel prn\\Prescribing Home Zynex NexWave Stimulator Device and supplies as needed. IFC, NMES and TENS medically necessary Treatment Rx: Daily @ 30-40 minutes per treatment PRN. Zynex NexWave only, no substitutions. Treatment Goals: 1) To reduce and/or eliminate pain 2) To improve functional capacity and Activities of daily living 3) To reduce or prevent the need for oral medications 4) To improve circulation in the injured region 5) To decrease or prevent muscle spasm and muscle atrophy 6) To provide a self-management tool to the patient The patient has not sufficiently improved with conservative care. Numerous studies indexed by Medline and PubMed.gov have shown Neuromuscular, Interferential, and TENS stimulators to reduce pain, improve function, and reduce medication use in injured patients. Continued use of this evidence based, safe, drug free treatment is both reasonable and medically necessary at this time.   Scheduled voltaren gel  XR reviewed and is stable  4. History of anxiety: discussed with her mom that she feels this was a big risk factor for her accident  5. Neuropsych/cognition: This patient is not capable of making decisions on her own behalf. 6. Left buttock unstageable ulcer.  continue Medihoney to buttock wound daily cover with foam dressing.  Changing dressing every 3 days or as needed soiling   7. Fluids/Electrolytes/Nutrition: Routine in and outs with follow-up chemistries  -Eating 100% most meals with assist  8.  Cavitary right lower lobe pneumonia likely aspiration pneumonia/MRSA pneumonia.  Resolved    Latest Ref Rng & Units 02/05/2023   11:12 AM 01/22/2023    6:36 AM 12/20/2022   10:54 AM  CBC  WBC 4.0 - 10.5 K/uL 8.5  8.1  5.6   Hemoglobin 12.0 - 15.0 g/dL 47.8  29.5  62.1   Hematocrit 36.0 - 46.0 % 46.7  47.5  42.8   Platelets 150 - 400 K/uL 284  274  253      9.  History of drug abuse.   Positive cocaine on urine drug screen.  Provide counseling  10.  AKI/hypovolemia and ATN.  resolved    Latest Ref Rng & Units 02/09/2023    5:57 AM 02/05/2023  11:12 AM 02/02/2023    5:30 AM  BMP  Glucose 70 - 99 mg/dL  161    BUN 6 - 20 mg/dL  13    Creatinine 0.96 - 1.00 mg/dL 0.45  4.09  8.11   Sodium 135 - 145 mmol/L  138    Potassium 3.5 - 5.1 mmol/L  4.0    Chloride 98 - 111 mmol/L  103    CO2 22 - 32 mmol/L  28    Calcium 8.9 - 10.3 mg/dL  9.0      11.  Mild transaminitis with rhabdomyolysis.  Both resolved  12.  Hypotension. Resolved, d/c midodrine  BP reviewed and is stable.  13. Impaired initiation: continue amantadine 100mg  BID  14. Suboptimal vitamin D: continue 1,000U D3 daily  15. Spasticity:   -discussed serial casting with OT and her mom  - continue baclofen to 20mg  TID and tizanidine to 4mg  TID. Continue wrist, elbow brace, and PRAFOs. Ordered a night splint for LLE  Repeat CMP today given chronic tizanidine use  -9/7 continues to have significant tone despite use of tizanidine and baclofen, may be beneficial consider baclofen pump in outpatient setting if this causes significant difficulties in care  9/8 consider decrease baclofen if dyskinetic movements continue  9/21- Wearing PRAFOs 16. Bowel and bladder incontinence: continue bowel and bladder program   17. Left hand skin maceration: Eucerin ordered, discussed hand split with OT, conitnue  18. History of magnesium deficiency: magnesium 500mg  started HS    02/15/2023    9:20 AM 02/15/2023    3:40 AM 02/14/2023    7:40 PM  Vitals with BMI  Systolic 113 111 914  Diastolic 87 60 70  Pulse 99 80 91    19. HTN: see above Increase tizanidine to 6mg  TID. Magnesium supplement added HS. Increase propanolol to 20mg  TID     02/15/2023    9:20 AM 02/15/2023    3:40 AM 02/14/2023    7:40 PM  Vitals with BMI  Systolic 113 111 782  Diastolic 87 60 70  Pulse 99 80 91     20. Area of redness between left  thumb and index finger: continue foam dressing  21. Scalp eczema: selsun blue ordered, continue  -9/7 patient's mother feels like this is causing her to itch, will start some hydrocortisone cream  22. Fatigue: improved, discussed with team may be secondary to anti-spasticity medications, B12 reviewed and is 335, in suboptimal range, will give 1,000U B12 injection 8/7, will order monthly as needed, metanx started, increase to BID, continue  23. Right wrist swelling: XR ordered and shows no acute fracture, shows 2.31mm ulnar variance, brace removed to given patient a break from it. Voltaren gel ordered prn for discomfort, continue, CT ordered and show no acute osseus injury, continue ace wrap  23. Dysphagia: continue D3/thins, d/c ensure as blood pressure severely elevates whenever restarted  24. MRSA nares positive: negative on repeat, precautions d/ced  25. Tachycardic: resolved, increase propanolol to BID.  continueTizanidine increased to 6mg  TID. Resolved, amantadine increased back to 100mg  BID, continue this dose  26.  Dyskinetic movements in head and neck - appears to be myoclonic jerks related to hypoxic brain injury - as discussed with pt mom, no specific meds for this on antispasticity meds , consioder benzo although with pt's SUD would avoid this class Appear to have resolved, EEG reviewed and was negative for seizure.   27. Small sore on the base of 5th metatarsal head: local wound  care, continue  28. Rash on face: resolved, discussed with mother could have been secondary to added sugars in nutritional supplements  29. Sacral pressure injury: air mattress ordered, Placed nursing order to offload q1 hour while awake, continue medihoney, wound care consulted, discussed with nursing that this is improving  30. Cervical extensor weakness: tried decreasing tizanidine but spasticity appears increased and BP elevated so will increase back to 6mg  TID, continue this dose  31. Low protein  stores: prosource ordered, continue  32. Intermittent lethargy: labs reviewed and are stable, discussed with team may be secondary to anti-spasticity medications  33. Blood blister to left heel: continue foam heel protectors.   34. Suboptimal vitamin D level: increase to 4,000 U daily.    LOS: 89 days A FACE TO FACE EVALUATION WAS PERFORMED  Darian Ace P Burlon Centrella 02/15/2023, 10:29 AM

## 2023-02-15 NOTE — Progress Notes (Signed)
Speech Language Pathology Daily Session Note  Patient Details  Name: Nicole [name] MRN: [id number] Date of Birth: 12-11-1975  Today's Date: 02/15/2023 SLP Individual Time: 1450-1530 SLP Individual Time Calculation (min): 40 min  Short Term Goals: Week 9: SLP Short Term Goal 1 (Week 9): Week 12 - Pt will present w/ appropriate response to functional stimuli (i.e. accept spoon, grasp basic item appropriately, response to tactile/auditory stimuli) 60% of the time w/ maxA multimodal cues SLP Short Term Goal 2 (Week 9): Week 12 - Pt will answer open ended questions w/ modA in 60% of opportunities SLP Short Term Goal 3 (Week 9): Week 12 - Pt will answer basic yes/no questions with modA in 70% of opportunities SLP Short Term Goal 4 (Week 9): Week 12 - Pt will utilize increased vocal intensity w/ maxA verbal cues to remain 70% intelligibile at phrase level  Skilled Therapeutic Interventions: SLP conducted skilled therapy session targeting cognitive retraining and communication goals. SLP targeted answering open ended questions, using functional items appropriately, and following directions this session. Patient exhibited mildly improved vocal intensity throughout session and was overall 70% intelligible at the phrase level given modA. Patient's overall speech initiation significantly improved this session compared to last. Patient answered open ended questions in 60% of opportunities given modA and responded appropriately to functional stimuli 60% of the time given maxA multimodal cues. Patient followed simple, 1-step directions re: direct environmental objects 50% of the time given maxA multimodal cues. Patient was left in chair with all needs within reach. SLP will continue to target goals per plan of care.      Pain Pain Assessment Pain Scale: 0-10 Pain Score: 0-No pain  Therapy/Group: Individual Therapy  [name], M.A., CCC-SLP  [name] 02/15/2023, 4:01 PM

## 2023-02-15 NOTE — Progress Notes (Signed)
Occupational Therapy Session Note  Patient Details  Name: Nicole [name] MRN: [id number] Date of Birth: 08/22/1975  Today's Date: 02/15/2023 OT Individual Time: 1117-1200 OT Individual Time Calculation (min): 43 min    Short Term Goals: Week 3:  OT Short Term Goal 1 (Week 3): Patient will wash upper legs after set up and verbal cue with min assist OT Short Term Goal 1 - Progress (Week 3): Other (comment) OT Short Term Goal 2 (Week 3): Patient will reach toward lower leg and foot with min assist using RUE in prep for lower body self care OT Short Term Goal 2 - Progress (Week 3): Other (comment) OT Short Term Goal 3 (Week 3): Patient will utilize LUE for part of one functional task with min assist OT Short Term Goal 3 - Progress (Week 3): Other (comment)  Skilled Therapeutic Interventions/Progress Updates:    Patient received sidelying in bed - NT providing care.  Patient agreeable to shower and transferred into shower in rolling shower chair.  Patient able to wash face with verbal cue (vs hand over hand) after set up.  Patient transferred back to bed to dress.  Working to improve LE range of motion as needed for dressing through gentle sliding leg into extension while donning pants.  Patient does seem to tolerate functional movement better at times.  Patient left in flat bed to promote extension.  Patient fatigued after shower, and able to relax head onto pillow - which allows improved resting position.  Call bell in bed next to patient.    Therapy Documentation Precautions:  Precautions Precautions: Fall Precaution Comments: Contact precautions & delayed processing Required Braces or Orthoses: Other Brace Other Brace: B adjustable night splints; B elbow "cosey" splints; L palm protector; R mitt Restrictions Weight Bearing Restrictions: No  Pain:  Indicates both knees hurt.        Therapy/Group: Individual Therapy  [name] 02/15/2023, 12:43 PM

## 2023-02-15 NOTE — Progress Notes (Signed)
Physical Therapy Session Note  Patient Details  Name: OLUWATENIOLA LEITCH MRN: [id number] Date of Birth: 05-Jun-1975  Today's Date: 02/15/2023 PT Individual Time: 1345-1415 PT Individual Time Calculation (min): 30 min   Short Term Goals: Week 9: PT Short Term Goal 1 (Week 9): Pt will initiate bed mobility with totalA PT Short Term Goal 2 (Week 9): Pt will maintain unsupported sitting balance for 30 seconds without LOB PT Short Term Goal 3 (Week 9): Pt will complete bed<>chair transfers with no more than totalA  Skilled Therapeutic Interventions/Progress Updates:      Pt in bed to start. Pt with dry brief. Worked on stretching her into extension - continues to have pain with L knee extension (XR negative). Able to assist her from EOB to TIS w/c with squat pivot transfer. Setup at mirror and worked on automatic tasks with hand-over-hand assist. She did need assist changing shirts which she needed dependent assist for. Left reclined in TIS, full lap tray on, all needs met.   Therapy Documentation Precautions:  Precautions Precautions: Fall Precaution Comments: Contact precautions & delayed processing Required Braces or Orthoses: Other Brace Other Brace: B adjustable night splints; B elbow "cosey" splints; L palm protector; R mitt Restrictions Weight Bearing Restrictions: No General:     Therapy/Group: Individual Therapy  Orrin Brigham 02/15/2023, 7:54 AM

## 2023-02-16 LAB — CREATININE, SERUM
Creatinine, Ser: 0.49 mg/dL (ref 0.44–1.00)
GFR, Estimated: >60 mL/min (ref >60)

## 2023-02-16 MED ORDER — VITAMIN D 25 MCG (1000 UNIT) PO TABS
5000.0000 [IU] | ORAL_TABLET | Freq: Every day | ORAL | Status: DC
  Administered 2023-02-17 – 2023-05-30 (×103): 5000 [IU] via ORAL
  Filled 2023-02-16 (×104): qty 5

## 2023-02-16 NOTE — Progress Notes (Signed)
Physical Therapy Weekly Progress Note  Patient Details  Name: Nicole [name] MRN: [id number] Date of Birth: [birth year]  Beginning of progress report period: February 01, 2023 End of progress report period: February 16, 2023  Today's Date: 02/16/2023 PT Individual Time: 1400-1430 PT Individual Time Calculation (min): 30 min   [name] has made limited functional progress this reporting period - continues to require dependent care for all mobility, incontinent x2. She is becoming more alert and is verbalizing more.   Patient continues to demonstrate the following deficits muscle weakness and muscle joint tightness, decreased cardiorespiratoy endurance, abnormal tone, unbalanced muscle activation, motor apraxia, decreased coordination, and decreased motor planning, ideational apraxia, decreased initiation, decreased attention, decreased awareness, decreased problem solving, decreased safety awareness, decreased memory, and delayed processing, and decreased sitting balance, decreased standing balance, decreased postural control, hemiplegia, and decreased balance strategies and therefore will continue to benefit from skilled PT intervention to increase functional independence with mobility.  Patient progressing toward long term goals..  Continue plan of care.  PT Short Term Goals Week 1:  PT Short Term Goal 1 (Week 1): Week 13; pt will complete bed mobility with totalA (pt <25%) PT Short Term Goal 1 - Progress (Week 1): Not met PT Short Term Goal 2 (Week 1): Week 13: pt will maintain unsupported sitting balance for up to 1 minute consistently with CGA PT Short Term Goal 2 - Progress (Week 1): Not met PT Short Term Goal 3 (Week 1): Week 13: Pt will complete bed<>chair transfers with totalA (pt <25%) PT Short Term Goal 3 - Progress (Week 1): Not met  Skilled Therapeutic Interventions/Progress Updates:      Pt in bed on arrival. Pt trying to communicate with PT but unable to understand 90% of what  she's trying to verbalize. Speech very soft, fast, and mumbled. When asked if she has a soiled brief, she says "yes." Pt with soaking wet brief, through the pants. Dependent care for brief change and removed soiled clothes. Ice pack on her R knee which was removed - has some mild redness from this. She continues to have significant R knee discomfort during bed mobility and PROM. Provided gentle stretching to tolerance to promote extension and abduction and abduction. NT made aware of positioning recommendations with bed flat. All needs met at end of treatment.    Therapy Documentation Precautions:  Precautions Precautions: Fall Precaution Comments: Contact precautions & delayed processing Required Braces or Orthoses: Other Brace Other Brace: B adjustable night splints; B elbow "cosey" splints; L palm protector; R mitt Restrictions Weight Bearing Restrictions: No   Therapy/Group: Individual Therapy  Orrin Brigham 02/16/2023, 7:49 AM

## 2023-02-16 NOTE — Progress Notes (Signed)
Occupational Therapy Session Note  Patient Details  Name: Nicole [name] MRN: [id number] Date of Birth: 01/22/1976  Today's Date: 02/16/2023 OT Individual Time: 1100-1130 OT Individual Time Calculation (min): 30 min    Short Term Goals: Week 3:  OT Short Term Goal 1 (Week 3): Patient will wash upper legs after set up and verbal cue with min assist OT Short Term Goal 1 - Progress (Week 3): Other (comment) OT Short Term Goal 2 (Week 3): Patient will reach toward lower leg and foot with min assist using RUE in prep for lower body self care OT Short Term Goal 2 - Progress (Week 3): Other (comment) OT Short Term Goal 3 (Week 3): Patient will utilize LUE for part of one functional task with min assist OT Short Term Goal 3 - Progress (Week 3): Other (comment)  Skilled Therapeutic Interventions/Progress Updates:    Patient received supine in bed.  [name] at bedside.  Patient asked if she wanted to get out of bed - and stated- "not really."  Noted patient with wet/soiled brief, and assisted patient to change into clean dry brief.  Patient able to sustain grip with right hand on bedrail to maintain sidelying position.  Dressed lower body, again using functional movement to obtain range of motion in BLE.  Patient agreeable to application of ice to left knee.  Left in bed - flat bed - in extended position with call bell in reach.    Therapy Documentation Precautions:  Precautions Precautions: Fall Precaution Comments: Contact precautions & delayed processing Required Braces or Orthoses: Other Brace Other Brace: B adjustable night splints; B elbow "cosey" splints; L palm protector; R mitt Restrictions Weight Bearing Restrictions: No   Pain: Pain Assessment Pain Scale: 0-10 Pain Score: Unable to score - pain in left knee.  Ice applied.      Therapy/Group: Individual Therapy  [name] 02/16/2023, 12:15 PM

## 2023-02-16 NOTE — Progress Notes (Signed)
PROGRESS NOTE   Subjective/Complaints: No new complaints this morning Tolerated therapy well yesterday Denies pain No issues overnight  ROS: Limited by cognition, +knee pain  Objective:   DG Knee 1-2 Views Left  Result Date: 02/14/2023 CLINICAL DATA:  Left knee pain, initial encounter EXAM: LEFT KNEE - 2 VIEW COMPARISON:  None Available. FINDINGS: No evidence of fracture, dislocation, or joint effusion. No evidence of arthropathy or other focal bone abnormality. Soft tissues are unremarkable. IMPRESSION: No acute abnormality noted. Electronically Signed   By: Alcide Clever M.D.   On: 02/14/2023 22:47   No results for input(s): "WBC", "HGB", "HCT", "PLT" in the last 72 hours.     Recent Labs    02/16/23 0749  CREATININE 0.49            Intake/Output Summary (Last 24 hours) at 02/16/2023 1040 Last data filed at 02/16/2023 0739 Gross per 24 hour  Intake 472 ml  Output --  Net 472 ml      Pressure Injury 10/31/22 Buttocks Left Unstageable - Full thickness tissue loss in which the base of the injury is covered by slough (yellow, tan, gray, green or brown) and/or eschar (tan, brown or black) in the wound bed. (Active)  10/31/22 2000  Location: Buttocks  Location Orientation: Left  Staging: Unstageable - Full thickness tissue loss in which the base of the injury is covered by slough (yellow, tan, gray, green or brown) and/or eschar (tan, brown or black) in the wound bed.  Wound Description (Comments):   Present on Admission: Yes     Pressure Injury 11/18/22 Toe (Comment  which one) Anterior;Right Deep Tissue Pressure Injury - Purple or maroon localized area of discolored intact skin or blood-filled blister due to damage of underlying soft tissue from pressure and/or shear. small are (Active)  11/18/22 1300  Location: Toe (Comment  which one)  Location Orientation: Anterior;Right  Staging: Deep Tissue Pressure  Injury - Purple or maroon localized area of discolored intact skin or blood-filled blister due to damage of underlying soft tissue from pressure and/or shear.  Wound Description (Comments): small area on right great toe metatarsal  Present on Admission: Yes     Pressure Injury 02/09/23 Heel Left Deep Tissue Pressure Injury - Purple or maroon localized area of discolored intact skin or blood-filled blister due to damage of underlying soft tissue from pressure and/or shear. Black/purple and boggy,  size of a quar (Active)  02/09/23 0543  Location: Heel  Location Orientation: Left  Staging: Deep Tissue Pressure Injury - Purple or maroon localized area of discolored intact skin or blood-filled blister due to damage of underlying soft tissue from pressure and/or shear.  Wound Description (Comments): Black/purple and boggy,  size of a quarter.  Foam dressing applied  Present on Admission: No    Physical Exam: Vital Signs Blood pressure 116/89, pulse 78, temperature 98.1 F (36.7 C), resp. rate 18, height 5\' 1"  (1.549 m), weight 45.8 kg, SpO2 97%. Gen: no distress, normal appearing HEENT: oral mucosa pink and moist, NCAT Cardio: Reg rate Chest: normal effort, normal rate of breathing Abd: soft, non-distended Ext: no edema Psych: pleasant, normal affect Psych: pleasant, flat affect  Skin: + Scaling dried skin on head present-2 small excoriations, sore on the base of the 5th metatarsal head, rash on face . stage 2 to right toe, area of redness between left thumb and index finger-covered in dressing, left buttock unstageable area not viewed +redness to bilateral elbows  Neurologic: Minimal engagement on exam, tired. Answers "good morning"  Does not follow simple commands. Delayed processing  Tone:  MAS 1 right elbow,MAS 1 in RIght finger and wrist flexors  MAS 3 knee flexors, and MAS 3 L>R  hip adductors  MAS 3 L elbow , MAS 3-4 in L wrist and finger flexors  + LUE WHO  Motor 0/5 strength  in LUE and BLE, Musculoskeletal: reduced bilateral shoulder and elbow ROM due to increased tone , pain with passive knee extension bilaterally  Right wrist swelling -Ace wrapped.   Assessment/Plan: 1. Functional deficits which require 3+ hours per day of interdisciplinary therapy in a comprehensive inpatient rehab setting. Physiatrist is providing close team supervision and 24 hour management of active medical problems listed below. Physiatrist and rehab team continue to assess barriers to discharge/monitor patient progress toward functional and medical goals  Care Tool:  Bathing    Body parts bathed by patient: Face, Abdomen, Chest, Left upper leg, Right upper leg   Body parts bathed by helper: Right arm, Left arm, Front perineal area, Buttocks, Left lower leg, Right lower leg Body parts n/a: Right arm, Left arm, Front perineal area, Buttocks, Right upper leg, Left upper leg, Right lower leg, Left lower leg   Bathing assist Assist Level: Maximal Assistance - Patient 24 - 49%     Upper Body Dressing/Undressing Upper body dressing   What is the patient wearing?: Pull over shirt    Upper body assist Assist Level: Maximal Assistance - Patient 25 - 49%    Lower Body Dressing/Undressing Lower body dressing      What is the patient wearing?: Pants, Incontinence brief     Lower body assist Assist for lower body dressing: Dependent - Patient 0%     Toileting Toileting Toileting Activity did not occur (Clothing management and hygiene only): N/A (no void or bm)  Toileting assist Assist for toileting: Total Assistance - Patient < 25%     Transfers Chair/bed transfer  Transfers assist  Chair/bed transfer activity did not occur: Safety/medical concerns  Chair/bed transfer assist level: Total Assistance - Patient < 25%     Locomotion Ambulation   Ambulation assist   Ambulation activity did not occur: Safety/medical concerns  Assist level: 2 helpers   Max distance: 39ft    Walk 10 feet activity   Assist  Walk 10 feet activity did not occur: Safety/medical concerns  Assist level: 2 helpers     Walk 50 feet activity   Assist Walk 50 feet with 2 turns activity did not occur: Safety/medical concerns         Walk 150 feet activity   Assist Walk 150 feet activity did not occur: Safety/medical concerns         Walk 10 feet on uneven surface  activity   Assist Walk 10 feet on uneven surfaces activity did not occur: Safety/medical concerns         Wheelchair     Assist Is the patient using a wheelchair?: Yes Type of Wheelchair: Manual    Wheelchair assist level: Dependent - Patient 0% Max wheelchair distance: 54ft    Wheelchair 50 feet with 2 turns activity    Assist  Wheelchair 50 feet with 2 turns activity did not occur: Safety/medical concerns   Assist Level: Dependent - Patient 0%   Wheelchair 150 feet activity     Assist  Wheelchair 150 feet activity did not occur: Safety/medical concerns   Assist Level: Dependent - Patient 0%   Blood pressure 116/89, pulse 78, temperature 98.1 F (36.7 C), resp. rate 18, height 5\' 1"  (1.549 m), weight 45.8 kg, SpO2 97%.  Medical Problem List and Plan: 1. 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 Code status  DNR Continue CIR PT, OT and SLP  2.  Impaired mobility: continue Lovenox, weekly creatinine ordered             -antiplatelet therapy: N/A  3. Knee paint: Tylenol as needed,  voltaren gel prn\\Prescribing Home Zynex NexWave Stimulator Device and supplies as needed. IFC, NMES and TENS medically necessary Treatment Rx: Daily @ 30-40 minutes per treatment PRN. Zynex NexWave only, no substitutions. Treatment Goals: 1) To reduce and/or eliminate pain 2) To improve functional capacity and Activities of daily living 3) To reduce or prevent the need for oral medications 4) To improve circulation in the injured region 5) To decrease or prevent muscle spasm and muscle atrophy 6) To provide a self-management tool to the patient The patient has not sufficiently improved with conservative care. Numerous studies indexed by Medline and PubMed.gov have shown Neuromuscular, Interferential, and TENS stimulators to reduce pain, improve function, and reduce medication use in injured patients. Continued use of this evidence based, safe, drug free treatment is both reasonable and medically necessary at this time.   Scheduled voltaren gel  XR reviewed and is stable  4. History of anxiety: discussed with her mom that she feels this was a big risk factor for her accident  5. Neuropsych/cognition: This patient is not capable of making decisions on her own behalf. 6. Left buttock unstageable ulcer.  continue Medihoney to buttock wound daily cover with foam dressing.  Changing dressing every 3 days or as needed soiling   7. Fluids/Electrolytes/Nutrition: Routine in and outs with follow-up chemistries  -Eating 100% most meals with assist  8.  Cavitary right lower lobe pneumonia likely aspiration pneumonia/MRSA pneumonia.  Resolved    Latest Ref Rng & Units 02/05/2023   11:12 AM 01/22/2023    6:36 AM 12/20/2022   10:54 AM  CBC  WBC 4.0 - 10.5 K/uL 8.5  8.1  5.6   Hemoglobin 12.0 - 15.0 g/dL 16.1  09.6  04.5   Hematocrit 36.0 - 46.0 % 46.7  47.5  42.8   Platelets 150 - 400 K/uL 284  274  253      9.  History of drug abuse.  Positive cocaine on urine drug screen.  Provide counseling  10.   AKI/hypovolemia and ATN.  resolved    Latest Ref Rng & Units 02/16/2023    7:49 AM 02/09/2023    5:57 AM 02/05/2023   11:12 AM  BMP  Glucose 70 - 99 mg/dL   324   BUN 6 - 20 mg/dL   13   Creatinine 4.01 - 1.00 mg/dL 0.27  2.53  6.64   Sodium 135 - 145 mmol/L   138   Potassium 3.5 - 5.1 mmol/L   4.0   Chloride 98 - 111 mmol/L   103   CO2 22 - 32 mmol/L   28   Calcium 8.9 - 10.3 mg/dL   9.0     11.  Mild transaminitis with rhabdomyolysis.  Both resolved  12.  Hypotension. Resolved, d/c midodrine  BP reviewed and is stable.  13. Impaired initiation: continue amantadine 100mg  BID  14. Suboptimal vitamin D: continue 1,000U D3 daily  15. Spasticity:   -discussed serial casting with OT and her mom  - continue baclofen to 20mg  TID and tizanidine to 4mg  TID. Continue wrist, elbow brace, and PRAFOs. Ordered a night splint for LLE  Repeat CMP today given chronic tizanidine use  -9/7 continues to have significant tone despite use of tizanidine and baclofen, may be beneficial consider baclofen pump in outpatient setting if this causes significant difficulties in care  9/8 consider decrease baclofen if dyskinetic movements continue  9/21- Wearing PRAFOs 16. Bowel and bladder incontinence: continue bowel and bladder program   17. Left hand skin maceration: Eucerin ordered, discussed hand split with OT, conitnue  18. History of magnesium deficiency: magnesium 500mg  started HS    02/16/2023    3:04 AM 02/15/2023    7:42 PM 02/15/2023    2:30 PM  Vitals with BMI  Systolic 116 113 98  Diastolic 89 83 67  Pulse 78 89 85    19. HTN: see above Increase tizanidine to 6mg  TID. Magnesium supplement added HS. Increase propanolol to 20mg  TID     02/16/2023    3:04 AM 02/15/2023    7:42 PM 02/15/2023    2:30 PM  Vitals with BMI  Systolic 116 113 98  Diastolic 89 83 67  Pulse 78 89 85     20. Area of redness between left thumb and index finger: continue foam dressing  21. Scalp eczema:  selsun blue ordered, continue  -9/7 patient's mother feels like this is causing her to itch, will start some hydrocortisone cream  22. Fatigue: improved, discussed with team may be secondary to anti-spasticity medications, B12 reviewed and is 335, in suboptimal range, will give 1,000U B12 injection 8/7, will order monthly as needed, metanx started, increase to BID, continue  23. Right wrist swelling: XR ordered and shows no acute fracture, shows 2.43mm ulnar variance, brace removed to given patient a break from it. Voltaren gel ordered prn for discomfort, continue, CT ordered and show no acute osseus injury, continue ace wrap  23. Dysphagia: continue D3/thins, d/c ensure as blood pressure severely elevates whenever restarted  24. MRSA nares positive: negative on repeat, precautions d/ced  25. Tachycardic: resolved, increase propanolol to BID.  continueTizanidine increased to 6mg  TID. Resolved, amantadine increased back to 100mg  BID, continue this dose  26.  Dyskinetic movements in head and neck - appears to be myoclonic jerks related to hypoxic brain injury - as discussed with pt mom, no specific meds for this on antispasticity meds , consioder benzo although with pt's SUD would avoid this class Appear to have resolved, EEG reviewed and was negative for seizure.   27. Small sore on the base of 5th metatarsal head: local wound care, continue  28. Rash on face: resolved, discussed  with mother could have been secondary to added sugars in nutritional supplements  29. Sacral pressure injury: air mattress ordered, Placed nursing order to offload q1 hour while awake, continue medihoney, wound care consulted, discussed with nursing that this is improving  30. Cervical extensor weakness: tried decreasing tizanidine but spasticity appears increased and BP elevated so will increase back to 6mg  TID, continue this dose  31. Low protein stores: prosource ordered, continue  32. Intermittent lethargy: labs  reviewed and are stable, discussed with team may be secondary to anti-spasticity medications  33. Blood blister to left heel: continue foam heel protectors.   34. Suboptimal vitamin D level: increase to 5,000 U daily.    LOS: 90 days A FACE TO FACE EVALUATION WAS PERFORMED  Ciarah Peace P Keithen Capo 02/16/2023, 10:40 AM

## 2023-02-16 NOTE — Progress Notes (Signed)
Speech Language Pathology Weekly Progress and Session Note  Patient Details  Name: Nicole [name] MRN: [id number] Date of Birth: 1976-04-09  Beginning of progress report period: February 09, 2023 End of progress report period: February 16, 2023  Today's Date: 02/16/2023 SLP Individual Time: 1430-1455 SLP Individual Time Calculation (min): 25 min  Short Term Goals: Week 9: SLP Short Term Goal 1 (Week 12): Week 12 - Pt will present w/ appropriate response to functional stimuli (i.e. accept spoon, grasp basic item appropriately, response to tactile/auditory stimuli) 60% of the time w/ maxA multimodal cues SLP Short Term Goal 1 - Progress (Week 12): Met SLP Short Term Goal 2 (Week 12): Week 12 - Pt will answer open ended questions w/ modA in 60% of opportunities SLP Short Term Goal 2 - Progress (Week 12): Met SLP Short Term Goal 3 (Week 12): Week 12 - Pt will answer basic yes/no questions with modA in 70% of opportunities SLP Short Term Goal 3 - Progress (Week 12): Not met SLP Short Term Goal 4 (Week 12): Week 12 - Pt will utilize increased vocal intensity w/ maxA verbal cues to remain 70% intelligibile at phrase level SLP Short Term Goal 4 - Progress (Week 12): Met    New Short Term Goals: Week 9: SLP Short Term Goal 1 (Week 13): Week 13 - Pt will present w/ appropriate response to functional stimuli (i.e. accept spoon, grasp basic item appropriately, response to tactile/auditory stimuli) 60% of the time w/ modA multimodal cues SLP Short Term Goal 2 (Week 13): Week 13 - Pt will answer open ended questions w/ modA in 70% of opportunities SLP Short Term Goal 3 (Week 13): Week 13 - Pt will answer basic yes/no questions with modA in 70% of opportunities SLP Short Term Goal 4 (Week 13): Week 13 - Pt will utilize increased vocal intensity w/ ModA verbal cues to remain 70% intelligibile at phrase level  Weekly Progress Updates: Patient continues to make slow but functional progress in communication and  has met 3/4 STGs this week.  This reporting period, patient continues to demonstrate improved initiation of verbal responses with both yes/no questions and spontaneous verbalizations at the phrase level for functional communication with improved vocal intensity and overall intelligibility. Patient also demonstrates improved initiation with functional tasks and use of functional items . Patient and family education ongoing. Patient would benefit from continued skilled SLP intervention to maximize her functional communication and reduce caregiver burden.    Intensity: Minumum of 1-2 x/day, 30 to 90 minutes Frequency: 1 to 3 out of 7 days Duration/Length of Stay: TBD due to SNF placement Treatment/Interventions: Cognitive remediation/compensation;Internal/external aids;Speech/Language facilitation;Cueing hierarchy;Environmental controls;Therapeutic Activities;Functional tasks;Multimodal communication approach;Patient/family education;Therapeutic Exercise   Daily Session  Skilled Therapeutic Interventions:  Skilled treatment session focused on communication goals. Upon arrival, patient was awake in bed. Patient was itching her scalp and reported that she was working on "scalp stuff." Patient agreeable to treatment session. When asked, patient reported she wanted to "work on things that make me seem more with it." SLP discussed a variety of goals that can be targeted for orientation, attention and memory. Patient participating in functional conversation, however, intelligibility was reduced due a low vocal intensity with patient appearing only 75% intelligible at the sentence level. Patient appeared uncomfortable with frequent grimacing and intermittent reports of pain. Patient repositioned to maximize comfort with patient then quickly falling asleep.  Patient left supine in bed with alarm on and all needs within reach. Continue with current plan of  care.   Pain Pain in left knee, patient repositioned    Therapy/Group: Individual Therapy  Arnecia Ector 02/16/2023, 6:53 AM

## 2023-02-16 NOTE — Progress Notes (Signed)
Occupational Therapy Session Note  Patient Details  Name: Nicole [name] MRN: [id number] Date of Birth: 07-11-1975  {CHL IP REHAB OT TIME CALCULATIONS:[id number]}   Short Term Goals: OT Short Term Goal 1 (Week 12): Patient will wash upper legs after set up and verbal cue with min assist OT Short Term Goal 2 (Week 12): Patient will reach toward lower leg and foot with min assist using RUE in prep for lower body self care OT Short Term Goal 3 (Week 12): Patient will utilize LUE for part of one functional task with min assist   Skilled Therapeutic Interventions/Progress Updates:    Patient agreeable to participate in OT session. Reports *** pain level.   Patient participated in skilled OT session focusing on ***. Therapist facilitated/assessed/developed/educated/integrated/elicited *** in order to improve/facilitate/promote    Therapy Documentation Precautions:  Precautions Precautions: Fall Precaution Comments: Contact precautions & delayed processing Required Braces or Orthoses: Other Brace Other Brace: B adjustable night splints; B elbow "cosey" splints; L palm protector; R mitt Restrictions Weight Bearing Restrictions: No   Therapy/Group: Individual Therapy  Limmie Patricia, OTR/L,CBIS  Supplemental OT - [facility] and [facility] Secure Chat Preferred   02/16/2023, 9:10 PM

## 2023-02-17 NOTE — Progress Notes (Signed)
Speech Language Pathology Daily Session Note  Patient Details  Name: Nicole [name] MRN: [id number] Date of Birth: [birth year]  Today's Date: 02/17/2023 SLP Individual Time: 1030-1055 SLP Individual Time Calculation (min): 25 min  Short Term Goals: Week 9: SLP Short Term Goal 1 (Week 13): Week 13 - Pt will present w/ appropriate response to functional stimuli (i.e. accept spoon, grasp basic item appropriately, response to tactile/auditory stimuli) 60% of the time w/ modA multimodal cues SLP Short Term Goal 2 (Week 13): Week 13 - Pt will answer open ended questions w/ modA in 70% of opportunities SLP Short Term Goal 3 (Week 13): Week 13 - Pt will answer basic yes/no questions with modA in 70% of opportunities SLP Short Term Goal 4 (Week 13): Week 13 - Pt will utilize increased vocal intensity w/ ModA verbal cues to remain 70% intelligibile at phrase level  Skilled Therapeutic Interventions: Skilled treatment session focused on cognitive and communication goals. Upon arrival, patient was awake in bed and quick to respond to social greetings. SLP facilitated session by providing a structured verbal task. Patient read aloud at the phrase level with Mod verbal cues for use of an increased vocal intensity to achieve ~80% intelligibility. Patient completed a phrase closure task with 100% accuracy and Min verbal cues. Overall Mod verbal cues and extra time were needed for overall initiation throughout tasks. Patient becoming fatigued toward end of session. Patient left semi-reclined in bed with all needs within reach. Continue with current plan of care.      Pain Pain Assessment Pain Scale: 0-10 Pain Score: 0-No pain  Therapy/Group: Individual Therapy  Sydnie Sigmund 02/17/2023, 11:28 AM

## 2023-02-17 NOTE — Progress Notes (Signed)
PROGRESS NOTE   Subjective/Complaints: No problems reported overnight by staff. Pt appears comfortable  ROS: Limited due to cognitive/behavioral   Objective:   No results found. No results for input(s): "WBC", "HGB", "HCT", "PLT" in the last 72 hours.     Recent Labs    02/16/23 0749  CREATININE 0.49            Intake/Output Summary (Last 24 hours) at 02/17/2023 0947 Last data filed at 02/17/2023 0804 Gross per 24 hour  Intake 956 ml  Output --  Net 956 ml      Pressure Injury 10/31/22 Buttocks Left Unstageable - Full thickness tissue loss in which the base of the injury is covered by slough (yellow, tan, gray, green or brown) and/or eschar (tan, brown or black) in the wound bed. (Active)  10/31/22 2000  Location: Buttocks  Location Orientation: Left  Staging: Unstageable - Full thickness tissue loss in which the base of the injury is covered by slough (yellow, tan, gray, green or brown) and/or eschar (tan, brown or black) in the wound bed.  Wound Description (Comments):   Present on Admission: Yes     Pressure Injury 11/18/22 Toe (Comment  which one) Anterior;Right Deep Tissue Pressure Injury - Purple or maroon localized area of discolored intact skin or blood-filled blister due to damage of underlying soft tissue from pressure and/or shear. small are (Active)  11/18/22 1300  Location: Toe (Comment  which one)  Location Orientation: Anterior;Right  Staging: Deep Tissue Pressure Injury - Purple or maroon localized area of discolored intact skin or blood-filled blister due to damage of underlying soft tissue from pressure and/or shear.  Wound Description (Comments): small area on right great toe metatarsal  Present on Admission: Yes     Pressure Injury 02/09/23 Heel Left Deep Tissue Pressure Injury - Purple or maroon localized area of discolored intact skin or blood-filled blister due to damage of underlying  soft tissue from pressure and/or shear. Black/purple and boggy,  size of a quar (Active)  02/09/23 0543  Location: Heel  Location Orientation: Left  Staging: Deep Tissue Pressure Injury - Purple or maroon localized area of discolored intact skin or blood-filled blister due to damage of underlying soft tissue from pressure and/or shear.  Wound Description (Comments): Black/purple and boggy,  size of a quarter.  Foam dressing applied  Present on Admission: No    Physical Exam: Vital Signs Blood pressure (!) 136/113, pulse 83, temperature 98.8 F (37.1 C), resp. rate 18, height 5\' 1"  (1.549 m), weight 45.8 kg, SpO2 99%. Constitutional: No distress . Vital signs reviewed. HEENT: NCAT, EOMI, oral membranes moist Neck: supple Cardiovascular: RRR without murmur. No JVD    Respiratory/Chest: CTA Bilaterally without wheezes or rales. Normal effort    GI/Abdomen: BS +, non-tender, non-distended Ext: no clubbing, cyanosis, or edema Psych:flat but cooperative  Skin: + Scaling dried skin on head present-2 small excoriations, sore on the base of the 5th metatarsal head, rash on face . stage 2 to right toe, area of redness between left thumb and index finger-covered in dressing, left buttock unstageable area not viewed +redness to bilateral elbows  Neurologic: minimal engagement. Makes eye contact Tone:  MAS 1 right elbow,MAS 1 in RIght finger and wrist flexors  MAS 3 knee flexors, and MAS 3 L>R  hip adductors  MAS 3 L elbow , MAS 3-4 in L wrist and finger flexors  + LUE WHO  Motor 0/5 strength in LUE and BLE, Musculoskeletal: reduced bilateral shoulder and elbow ROM due to increased tone , pain with passive knee extension bilaterally   .   Assessment/Plan: 1. Functional deficits which require 3+ hours per day of interdisciplinary therapy in a comprehensive inpatient rehab setting. Physiatrist is providing close team supervision and 24 hour management of active medical problems listed  below. Physiatrist and rehab team continue to assess barriers to discharge/monitor patient progress toward functional and medical goals  Care Tool:  Bathing    Body parts bathed by patient: Face, Abdomen, Chest, Left upper leg, Right upper leg   Body parts bathed by helper: Right arm, Left arm, Front perineal area, Buttocks, Left lower leg, Right lower leg Body parts n/a: Right arm, Left arm, Front perineal area, Buttocks, Right upper leg, Left upper leg, Right lower leg, Left lower leg   Bathing assist Assist Level: Maximal Assistance - Patient 24 - 49%     Upper Body Dressing/Undressing Upper body dressing   What is the patient wearing?: Pull over shirt    Upper body assist Assist Level: Maximal Assistance - Patient 25 - 49%    Lower Body Dressing/Undressing Lower body dressing      What is the patient wearing?: Pants, Incontinence brief     Lower body assist Assist for lower body dressing: Dependent - Patient 0%     Toileting Toileting Toileting Activity did not occur (Clothing management and hygiene only): N/A (no void or bm)  Toileting assist Assist for toileting: Total Assistance - Patient < 25%     Transfers Chair/bed transfer  Transfers assist  Chair/bed transfer activity did not occur: Safety/medical concerns  Chair/bed transfer assist level: Total Assistance - Patient < 25%     Locomotion Ambulation   Ambulation assist   Ambulation activity did not occur: Safety/medical concerns  Assist level: 2 helpers   Max distance: 8ft   Walk 10 feet activity   Assist  Walk 10 feet activity did not occur: Safety/medical concerns  Assist level: 2 helpers     Walk 50 feet activity   Assist Walk 50 feet with 2 turns activity did not occur: Safety/medical concerns         Walk 150 feet activity   Assist Walk 150 feet activity did not occur: Safety/medical concerns         Walk 10 feet on uneven surface  activity   Assist Walk 10 feet  on uneven surfaces activity did not occur: Safety/medical concerns         Wheelchair     Assist Is the patient using a wheelchair?: Yes Type of Wheelchair: Manual    Wheelchair assist level: Dependent - Patient 0% Max wheelchair distance: 54ft    Wheelchair 50 feet with 2 turns activity    Assist    Wheelchair 50 feet with 2 turns activity did not occur: Safety/medical concerns   Assist Level: Dependent - Patient 0%   Wheelchair 150 feet activity     Assist  Wheelchair 150 feet activity did not occur: Safety/medical concerns   Assist Level: Dependent - Patient 0%   Blood pressure (!) 136/113, pulse 83, temperature 98.8 F (37.1 C), resp. rate 18, height 5\' 1"  (1.549 m), weight 45.8  kg, SpO2 99%.  Medical Problem List and Plan: 1. 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 Code status  DNR Continue CIR PT, OT and SLP  2.  Impaired mobility: continue Lovenox, weekly creatinine ordered             -antiplatelet therapy: N/A  3. Knee paint: Tylenol as needed, voltaren gel prn\\Prescribing Home Zynex NexWave Stimulator Device and supplies as needed. IFC, NMES and TENS medically necessary Treatment Rx: Daily @ 30-40 minutes per treatment PRN. Zynex NexWave only, no substitutions. Treatment Goals: 1) To reduce and/or eliminate pain 2) To improve functional capacity and Activities of daily living 3) To reduce or prevent the need for oral medications 4) To improve circulation in the injured region 5) To decrease  or prevent muscle spasm and muscle atrophy 6) To provide a self-management tool to the patient The patient has not sufficiently improved with conservative care. Numerous studies indexed by Medline and PubMed.gov have shown Neuromuscular, Interferential, and TENS stimulators to reduce pain, improve function, and reduce medication use in injured patients. Continued use of this evidence based, safe, drug free treatment is both reasonable and medically necessary at this time.   Scheduled voltaren gel  XR reviewed and is stable  4. History of anxiety: discussed with her mom that she feels this was a big risk factor for her accident  5. Neuropsych/cognition: This patient is not capable of making decisions on her own behalf. 6. Left buttock unstageable ulcer.  continue Medihoney to buttock wound daily cover with foam dressing.  Changing dressing every 3 days or as needed soiling   7. Fluids/Electrolytes/Nutrition: Routine in and outs with follow-up chemistries  -Eating 100% most meals with assist and encouragement  8.  Cavitary right lower lobe pneumonia likely aspiration pneumonia/MRSA pneumonia.  Resolved    Latest Ref Rng & Units 02/05/2023   11:12 AM 01/22/2023    6:36 AM 12/20/2022   10:54 AM  CBC  WBC 4.0 - 10.5 K/uL 8.5  8.1  5.6   Hemoglobin 12.0 - 15.0 g/dL 16.1  09.6  04.5   Hematocrit 36.0 - 46.0 % 46.7  47.5  42.8   Platelets 150 - 400 K/uL 284  274  253      9.  History of drug abuse.  Positive cocaine on urine drug screen.  Provide counseling  10.  AKI/hypovolemia and ATN.  resolved    Latest Ref Rng & Units 02/16/2023    7:49 AM 02/09/2023    5:57 AM 02/05/2023   11:12 AM  BMP  Glucose 70 - 99 mg/dL   409   BUN 6 - 20 mg/dL   13   Creatinine 8.11 - 1.00 mg/dL 9.14  7.82  9.56   Sodium 135 - 145 mmol/L   138   Potassium 3.5 - 5.1 mmol/L   4.0   Chloride 98 - 111 mmol/L   103   CO2 22 - 32 mmol/L   28   Calcium 8.9 -  10.3 mg/dL   9.0     11.  Mild transaminitis with  rhabdomyolysis.  Both resolved  12.  Hypotension. Resolved, d/c midodrine  BP reviewed and is stable.  13. Impaired initiation: continue amantadine 100mg  BID  14. Suboptimal vitamin D: continue 1,000U D3 daily  15. Spasticity:   -discussed serial casting with OT and her mom  - continue baclofen to 20mg  TID and tizanidine to 4mg  TID. Continue wrist, elbow brace, and PRAFOs. Ordered a night splint for LLE  Repeat CMP today given chronic tizanidine use  -9/7 continues to have significant tone despite use of tizanidine and baclofen, may be beneficial consider baclofen pump in outpatient setting if this causes significant difficulties in care  9/8 consider decrease baclofen if dyskinetic movements continue  10/5 continue orthotics, ROM, positioning 16. Bowel and bladder incontinence: continue bowel and bladder program   17. Left hand skin maceration: Eucerin ordered, discussed hand split with OT, conitnue  18. History of magnesium deficiency: magnesium 500mg  started HS    02/17/2023    3:52 AM 02/16/2023    7:42 PM 02/16/2023    2:25 PM  Vitals with BMI  Systolic 136 118 409  Diastolic 113 79 81  Pulse 83 86 90    19. HTN: see above Increase tizanidine to 6mg  TID. Magnesium supplement added HS. Increase propanolol to 20mg  TID     02/17/2023    3:52 AM 02/16/2023    7:42 PM 02/16/2023    2:25 PM  Vitals with BMI  Systolic 136 118 811  Diastolic 113 79 81  Pulse 83 86 90     20. Area of redness between left thumb and index finger: continue foam dressing  21. Scalp eczema: selsun blue ordered, continue  -9/7 patient's mother feels like this is causing her to itch, will start some hydrocortisone cream  22. Fatigue: improved, discussed with team may be secondary to anti-spasticity medications, B12 reviewed and is 335, in suboptimal range, will give 1,000U B12 injection 8/7, will order monthly as needed, metanx started, increase to BID, continue  23. Right wrist swelling: XR  ordered and shows no acute fracture, shows 2.67mm ulnar variance, brace removed to given patient a break from it. Voltaren gel ordered prn for discomfort, continue, CT ordered and show no acute osseus injury, continue ace wrap  23. Dysphagia: continue D3/thins, d/c ensure as blood pressure severely elevates whenever restarted  24. MRSA nares positive: negative on repeat, precautions d/ced  25. Tachycardic: resolved, increase propanolol to BID.  continueTizanidine increased to 6mg  TID. Resolved, amantadine increased back to 100mg  BID, continue this dose  26.  Dyskinetic movements in head and neck - appears to be myoclonic jerks related to hypoxic brain injury - as discussed with pt mom, no specific meds for this on antispasticity meds , consioder benzo although with pt's SUD would avoid this class Appear to have resolved, EEG reviewed and was negative for seizure.   27. Small sore on the base of 5th metatarsal head: local wound care, continue  28. Rash on face: resolved, discussed with mother could have been secondary to added sugars in nutritional supplements  29. Sacral pressure injury: air mattress ordered, Placed nursing order to offload q1 hour while awake, continue medihoney, wound care consulted, discussed with nursing that this is improving  30. Cervical extensor weakness: tried decreasing tizanidine but spasticity appears increased and BP elevated so will increase back to 6mg  TID, continue this dose  31. Low protein stores: prosource ordered, continue  32. Intermittent lethargy: labs reviewed and are stable, discussed with team may be secondary to anti-spasticity medications  33. Blood blister to left heel: continue foam heel protectors.   34. Suboptimal vitamin D level: increase to 5,000 U daily.    LOS: 91 days A FACE TO FACE EVALUATION WAS PERFORMED  Ranelle Oyster 02/17/2023, 9:47 AM

## 2023-02-17 NOTE — Progress Notes (Signed)
Occupational Therapy Session Note  Patient Details  Name: SHERRILYN NAIRN MRN: [id number] Date of Birth: [birth year]  {CHL IP REHAB OT TIME CALCULATIONS:[id number]}   Short Term Goals: Week 3:  OT Short Term Goal 1 (Week 3): Patient will wash upper legs after set up and verbal cue with min assist OT Short Term Goal 1 - Progress (Week 3): Other (comment) OT Short Term Goal 2 (Week 3): Patient will reach toward lower leg and foot with min assist using RUE in prep for lower body self care OT Short Term Goal 2 - Progress (Week 3): Other (comment) OT Short Term Goal 3 (Week 3): Patient will utilize LUE for part of one functional task with min assist OT Short Term Goal 3 - Progress (Week 3): Other (comment)  Skilled Therapeutic Interventions/Progress Updates:  Pt received *** for skilled OT session with focus on ***. Pt agreeable to interventions, demonstrating overall *** mood. Pt reported ***/10 pain, stating "***" in reference to ***. OT offering intermediate rest breaks and positioning suggestions throughout session to address pain/fatigue and maximize participation/safety in session.    Pt remained *** with all immediate needs met at end of session. Pt continues to be appropriate for skilled OT intervention to promote further functional independence.    Therapy Documentation Precautions:  Precautions Precautions: Fall Precaution Comments: Contact precautions & delayed processing Required Braces or Orthoses: Other Brace Other Brace: B adjustable night splints; B elbow "cosey" splints; L palm protector; R mitt Restrictions Weight Bearing Restrictions: No   Therapy/Group: Individual Therapy  Lou Cal, OTR/L, MSOT  02/17/2023, 9:09 PM

## 2023-02-18 NOTE — Progress Notes (Signed)
PROGRESS NOTE   Subjective/Complaints: Pt comfortable. No issues this morning.   ROS: Limited due to cognitive/behavioral   Objective:   No results found. No results for input(s): "WBC", "HGB", "HCT", "PLT" in the last 72 hours.     Recent Labs    02/16/23 0749  CREATININE 0.49            Intake/Output Summary (Last 24 hours) at 02/18/2023 0844 Last data filed at 02/18/2023 1610 Gross per 24 hour  Intake 1000 ml  Output --  Net 1000 ml      Pressure Injury 10/31/22 Buttocks Left Unstageable - Full thickness tissue loss in which the base of the injury is covered by slough (yellow, tan, gray, green or brown) and/or eschar (tan, brown or black) in the wound bed. (Active)  10/31/22 2000  Location: Buttocks  Location Orientation: Left  Staging: Unstageable - Full thickness tissue loss in which the base of the injury is covered by slough (yellow, tan, gray, green or brown) and/or eschar (tan, brown or black) in the wound bed.  Wound Description (Comments):   Present on Admission: Yes     Pressure Injury 11/18/22 Toe (Comment  which one) Anterior;Right Deep Tissue Pressure Injury - Purple or maroon localized area of discolored intact skin or blood-filled blister due to damage of underlying soft tissue from pressure and/or shear. small are (Active)  11/18/22 1300  Location: Toe (Comment  which one)  Location Orientation: Anterior;Right  Staging: Deep Tissue Pressure Injury - Purple or maroon localized area of discolored intact skin or blood-filled blister due to damage of underlying soft tissue from pressure and/or shear.  Wound Description (Comments): small area on right great toe metatarsal  Present on Admission: Yes     Pressure Injury 02/09/23 Heel Left Deep Tissue Pressure Injury - Purple or maroon localized area of discolored intact skin or blood-filled blister due to damage of underlying soft tissue from  pressure and/or shear. Black/purple and boggy,  size of a quar (Active)  02/09/23 0543  Location: Heel  Location Orientation: Left  Staging: Deep Tissue Pressure Injury - Purple or maroon localized area of discolored intact skin or blood-filled blister due to damage of underlying soft tissue from pressure and/or shear.  Wound Description (Comments): Black/purple and boggy,  size of a quarter.  Foam dressing applied  Present on Admission: No    Physical Exam: Vital Signs Blood pressure 109/88, pulse 75, temperature 97.6 F (36.4 C), resp. rate 19, height 5\' 1"  (1.549 m), weight 46.1 kg, SpO2 100%. Constitutional: No distress . Vital signs reviewed. HEENT: NCAT, EOMI, oral membranes moist Neck: supple Cardiovascular: RRR without murmur. No JVD    Respiratory/Chest: CTA Bilaterally without wheezes or rales. Normal effort    GI/Abdomen: BS +, non-tender, non-distended Ext: no clubbing, cyanosis, or edema Psych: flat  Skin: + Scaling dried skin on head present-2 small excoriations, sore on the base of the 5th metatarsal head, rash on face . stage 2 to right toe, area of redness between left thumb and index finger-covered in dressing, left buttock unstageable area not viewed +redness to bilateral elbows  Neurologic: minimal engagement. Makes eye contact Tone:  MAS 1 right  elbow,MAS 1 in RIght finger and wrist flexors  MAS 3 knee flexors, and MAS 3 L>R  hip adductors  MAS 3 L elbow , MAS 3-4 in L wrist and finger flexors  + LUE WHO---  Neurological exam stable today 02/18/2023   Motor 0/5 strength in LUE and BLE, Musculoskeletal: reduced bilateral shoulder and elbow ROM due to increased tone , pain with passive knee extension bilaterally   .   Assessment/Plan: 1. Functional deficits which require 3+ hours per day of interdisciplinary therapy in a comprehensive inpatient rehab setting. Physiatrist is providing close team supervision and 24 hour management of active medical problems  listed below. Physiatrist and rehab team continue to assess barriers to discharge/monitor patient progress toward functional and medical goals  Care Tool:  Bathing    Body parts bathed by patient: Face, Abdomen, Chest, Left upper leg, Right upper leg   Body parts bathed by helper: Right arm, Left arm, Front perineal area, Buttocks, Left lower leg, Right lower leg Body parts n/a: Right arm, Left arm, Front perineal area, Buttocks, Right upper leg, Left upper leg, Right lower leg, Left lower leg   Bathing assist Assist Level: Maximal Assistance - Patient 24 - 49%     Upper Body Dressing/Undressing Upper body dressing   What is the patient wearing?: Pull over shirt    Upper body assist Assist Level: Maximal Assistance - Patient 25 - 49%    Lower Body Dressing/Undressing Lower body dressing      What is the patient wearing?: Pants, Incontinence brief     Lower body assist Assist for lower body dressing: Dependent - Patient 0%     Toileting Toileting Toileting Activity did not occur (Clothing management and hygiene only): N/A (no void or bm)  Toileting assist Assist for toileting: Total Assistance - Patient < 25%     Transfers Chair/bed transfer  Transfers assist  Chair/bed transfer activity did not occur: Safety/medical concerns  Chair/bed transfer assist level: Total Assistance - Patient < 25%     Locomotion Ambulation   Ambulation assist   Ambulation activity did not occur: Safety/medical concerns  Assist level: 2 helpers   Max distance: 78ft   Walk 10 feet activity   Assist  Walk 10 feet activity did not occur: Safety/medical concerns  Assist level: 2 helpers     Walk 50 feet activity   Assist Walk 50 feet with 2 turns activity did not occur: Safety/medical concerns         Walk 150 feet activity   Assist Walk 150 feet activity did not occur: Safety/medical concerns         Walk 10 feet on uneven surface  activity   Assist Walk  10 feet on uneven surfaces activity did not occur: Safety/medical concerns         Wheelchair     Assist Is the patient using a wheelchair?: Yes Type of Wheelchair: Manual    Wheelchair assist level: Dependent - Patient 0% Max wheelchair distance: 80ft    Wheelchair 50 feet with 2 turns activity    Assist    Wheelchair 50 feet with 2 turns activity did not occur: Safety/medical concerns   Assist Level: Dependent - Patient 0%   Wheelchair 150 feet activity     Assist  Wheelchair 150 feet activity did not occur: Safety/medical concerns   Assist Level: Dependent - Patient 0%   Blood pressure 109/88, pulse 75, temperature 97.6 F (36.4 C), resp. rate 19, height 5\' 1"  (1.549  m), weight 46.1 kg, SpO2 100%.  Medical Problem List and Plan: 1. 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 Code status  DNR -Continue CIR therapies including PT, OT SLP  2.  Impaired mobility: continue Lovenox, weekly creatinine ordered             -antiplatelet therapy: N/A  3. Knee paint: Tylenol as needed, voltaren gel prn\\Prescribing Home Zynex NexWave Stimulator Device and supplies as needed. IFC, NMES and TENS medically necessary Treatment Rx: Daily @ 30-40 minutes per treatment PRN. Zynex NexWave only, no substitutions. Treatment Goals: 1) To reduce and/or eliminate pain 2) To improve functional capacity and Activities of daily living 3) To reduce or prevent the need for oral medications 4) To improve  circulation in the injured region 5) To decrease or prevent muscle spasm and muscle atrophy 6) To provide a self-management tool to the patient The patient has not sufficiently improved with conservative care. Numerous studies indexed by Medline and PubMed.gov have shown Neuromuscular, Interferential, and TENS stimulators to reduce pain, improve function, and reduce medication use in injured patients. Continued use of this evidence based, safe, drug free treatment is both reasonable and medically necessary at this time.   Scheduled voltaren gel  XR reviewed and is stable  4. History of anxiety: discussed with her mom that she feels this was a big risk factor for her accident  5. Neuropsych/cognition: This patient is not capable of making decisions on her own behalf. 6. Left buttock unstageable ulcer.  continue Medihoney to buttock wound daily cover with foam dressing.  Changing dressing every 3 days or as needed soiling   7. Fluids/Electrolytes/Nutrition: Routine in and outs with follow-up chemistries  -Eating 100% most meals with assist and encouragement  8.  Cavitary right lower lobe pneumonia likely aspiration pneumonia/MRSA pneumonia.  Resolved    Latest Ref Rng & Units 02/05/2023   11:12 AM 01/22/2023    6:36 AM 12/20/2022   10:54 AM  CBC  WBC 4.0 - 10.5 K/uL 8.5  8.1  5.6   Hemoglobin 12.0 - 15.0 g/dL 16.1  09.6  04.5   Hematocrit 36.0 - 46.0 % 46.7  47.5  42.8   Platelets 150 - 400 K/uL 284  274  253      9.  History of drug abuse.  Positive cocaine on urine drug screen.  Provide counseling  10.  AKI/hypovolemia and ATN.  resolved    Latest Ref Rng & Units 02/16/2023    7:49 AM 02/09/2023    5:57 AM 02/05/2023   11:12 AM  BMP  Glucose 70 - 99 mg/dL   409   BUN 6 - 20 mg/dL   13   Creatinine 8.11 - 1.00 mg/dL 9.14  7.82  9.56   Sodium 135 - 145 mmol/L   138   Potassium 3.5 - 5.1 mmol/L   4.0   Chloride 98 - 111 mmol/L   103   CO2 22 - 32 mmol/L  28   Calcium 8.9 - 10.3  mg/dL   9.0     11.  Mild transaminitis with rhabdomyolysis.  Both resolved  12.  Hypotension. Resolved, d/c midodrine  BP reviewed and is stable.  13. Impaired initiation: continue amantadine 100mg  BID  14. Suboptimal vitamin D: continue 1,000U D3 daily  15. Spasticity:   -discussed serial casting with OT and her mom  - continue baclofen to 20mg  TID and tizanidine to 4mg  TID. Continue wrist, elbow brace, and PRAFOs. Ordered a night splint for LLE  Repeat CMP today given chronic tizanidine use  -9/7 continues to have significant tone despite use of tizanidine and baclofen, may be beneficial consider baclofen pump in outpatient setting if this causes significant difficulties in care  9/8 consider decrease baclofen if dyskinetic movements continue  10/5-6  continue orthotics, ROM, positioning  when possible 16. Bowel and bladder incontinence: continue bowel and bladder program   17. Left hand skin maceration: Eucerin ordered, discussed hand split with OT, conitnue  18. History of magnesium deficiency: magnesium 500mg  started HS    02/18/2023    5:20 AM 02/17/2023    8:57 PM 02/17/2023    2:18 PM  Vitals with BMI  Weight   101 lbs 10 oz  Systolic  109   Diastolic  88   Pulse 75 70     19. HTN: see above Increase tizanidine to 6mg  TID. Magnesium supplement added HS. Increase propanolol to 20mg  TID  10/6 bp controlled    02/18/2023    5:20 AM 02/17/2023    8:57 PM 02/17/2023    2:18 PM  Vitals with BMI  Weight   101 lbs 10 oz  Systolic  109   Diastolic  88   Pulse 75 70      20. Area of redness between left thumb and index finger: continue foam dressing  21. Scalp eczema: selsun blue ordered, continue  -9/7 patient's mother feels like this is causing her to itch, will start some hydrocortisone cream  22. Fatigue: improved, discussed with team may be secondary to anti-spasticity medications, B12 reviewed and is 335, in suboptimal range, will give 1,000U B12 injection 8/7,  will order monthly as needed, metanx started, increase to BID, continue  23. Right wrist swelling: XR ordered and shows no acute fracture, shows 2.57mm ulnar variance, brace removed to given patient a break from it. Voltaren gel ordered prn for discomfort, continue, CT ordered and show no acute osseus injury, continue ace wrap  23. Dysphagia: continue D3/thins, d/c ensure as blood pressure severely elevates whenever restarted  24. MRSA nares positive: negative on repeat, precautions d/ced  25. Tachycardic: resolved, increase propanolol to BID.  continueTizanidine increased to 6mg  TID. Resolved, amantadine increased back to 100mg  BID, continue this dose  26.  Dyskinetic movements in head and neck - appears to be myoclonic jerks related to hypoxic brain injury - as discussed with pt mom, no specific meds for this on antispasticity meds , consioder benzo although with pt's SUD would avoid this class Appear to have resolved, EEG reviewed and was negative for seizure.   27. Small sore on the base of 5th metatarsal head: local wound care, continue  28. Rash on face: resolved, discussed with mother could have been secondary to added sugars in nutritional supplements  29. Sacral pressure injury: air mattress ordered, Placed nursing order to offload q1 hour while awake, continue medihoney, wound care consulted, discussed with nursing that this is improving  30. Cervical extensor  weakness: tried decreasing tizanidine but spasticity appears increased and BP elevated so will increase back to 6mg  TID, continue this dose  31. Low protein stores: prosource ordered, continue  32. Intermittent lethargy: labs reviewed and are stable, discussed with team may be secondary to anti-spasticity medications  33. Blood blister to left heel: continue foam heel protectors.   34. Suboptimal vitamin D level: increase to 5,000 U daily.    LOS: 92 days A FACE TO FACE EVALUATION WAS PERFORMED  Ranelle Oyster 02/18/2023, 8:44 AM

## 2023-02-19 NOTE — Progress Notes (Signed)
Speech Language Pathology Daily Session Note  Patient Details  Name: Nicole [name] MRN: [id number] Date of Birth: July 31, 1975  Today's Date: 02/19/2023 SLP Individual Time: 1430-1500 SLP Individual Time Calculation (min): 30 min  Short Term Goals: Week 9: SLP Short Term Goal 1 (Week 13): Week 13 - Pt will present w/ appropriate response to functional stimuli (i.e. accept spoon, grasp basic item appropriately, response to tactile/auditory stimuli) 60% of the time w/ modA multimodal cues SLP Short Term Goal 2 (Week 13): Week 13 - Pt will answer open ended questions w/ modA in 70% of opportunities SLP Short Term Goal 3 (Week 13): Week 13 - Pt will answer basic yes/no questions with modA in 70% of opportunities SLP Short Term Goal 4 (Week 13): Week 13 - Pt will utilize increased vocal intensity w/ ModA verbal cues to remain 70% intelligibile at phrase level  Skilled Therapeutic Interventions: Skilled treatment session focused on cognitive and communication goals. Upon arrival, patient's mother present and provided patient with a pen and pad of paper. Patient appeared preservative with pen but reported she was working on her signature. SLP attempted to engage patient in other functional writing tasks like writing the date (10-7) with minimal success. SLP facilitated session by providing max A multimodal cues for patient to utilize an increased vocal intensity at the phrase and sentence level to achieve 60-70% intelligibility during an informal conversation. Patient left upright in wheelchair with mother present and all needs within reach. Continue with current plan of care.      Pain No reports of pain but intermittent muscle spasms with grimacing   Therapy/Group: Individual Therapy  Elvin Banker 02/19/2023, 3:16 PM

## 2023-02-19 NOTE — Progress Notes (Signed)
Physical Therapy Session Note  Patient Details  Name: Nicole [name] MRN: [id number] Date of Birth: July 06, 1975  Today's Date: 02/19/2023 PT Individual Time: 4401-0272 PT Individual Time Calculation (min): 26 min   Short Term Goals: Week 13 - STGs = LTGs  Skilled Therapeutic Interventions/Progress Updates:     Pt received seated in [facility] and agrees to therapy. Reports pain in LLE. PT provides rest breaks as needed to manage pain. WC transport to gym for tim e management. Pt performs sit to stand with MaxA +2 and cues for positioning, hand placement, body mechanics, and initiation. Pt keeps LLE in flexed position with minimal WB, and maintains trunk in flexed position as well. Pt verbalizing pain in LLE, so returns to sitting. PT provides soft tissue stretch of LLE and RLE, specifically targeting hamstring tightness in LLE. Following, pt completes additional sit to stand with similar assistance and cues, and is able to tolerate standing ~1 minute with PT blocking Lt knee and providing manual facilitation of hip and trunk extension. WC transport back to room. Left seated in WC with alarm intact and all needs within reach.   Therapy/Group: Individual Therapy  Beau Fanny, PT, DPT 02/19/2023, 3:48 PM

## 2023-02-19 NOTE — Progress Notes (Signed)
PROGRESS NOTE   Subjective/Complaints: No new complaints this morning Patient's chart reviewed- No issues reported overnight Vitals signs stable   ROS: Limited due to cognitive/behavioral   Objective:   No results found. No results for input(s): "WBC", "HGB", "HCT", "PLT" in the last 72 hours.     No results for input(s): "NA", "K", "CL", "CO2", "GLUCOSE", "BUN", "CREATININE", "CALCIUM" in the last 72 hours.           Intake/Output Summary (Last 24 hours) at 02/19/2023 1042 Last data filed at 02/19/2023 0800 Gross per 24 hour  Intake 558 ml  Output --  Net 558 ml      Pressure Injury 10/31/22 Buttocks Left Unstageable - Full thickness tissue loss in which the base of the injury is covered by slough (yellow, tan, gray, green or brown) and/or eschar (tan, brown or black) in the wound bed. (Active)  10/31/22 2000  Location: Buttocks  Location Orientation: Left  Staging: Unstageable - Full thickness tissue loss in which the base of the injury is covered by slough (yellow, tan, gray, green or brown) and/or eschar (tan, brown or black) in the wound bed.  Wound Description (Comments):   Present on Admission: Yes     Pressure Injury 11/18/22 Toe (Comment  which one) Anterior;Right Deep Tissue Pressure Injury - Purple or maroon localized area of discolored intact skin or blood-filled blister due to damage of underlying soft tissue from pressure and/or shear. small are (Active)  11/18/22 1300  Location: Toe (Comment  which one)  Location Orientation: Anterior;Right  Staging: Deep Tissue Pressure Injury - Purple or maroon localized area of discolored intact skin or blood-filled blister due to damage of underlying soft tissue from pressure and/or shear.  Wound Description (Comments): small area on right great toe metatarsal  Present on Admission: Yes     Pressure Injury 02/09/23 Heel Left Deep Tissue Pressure Injury -  Purple or maroon localized area of discolored intact skin or blood-filled blister due to damage of underlying soft tissue from pressure and/or shear. Black/purple and boggy,  size of a quar (Active)  02/09/23 0543  Location: Heel  Location Orientation: Left  Staging: Deep Tissue Pressure Injury - Purple or maroon localized area of discolored intact skin or blood-filled blister due to damage of underlying soft tissue from pressure and/or shear.  Wound Description (Comments): Black/purple and boggy,  size of a quarter.  Foam dressing applied  Present on Admission: No    Physical Exam: Vital Signs Blood pressure 111/69, pulse 75, temperature 98.4 F (36.9 C), resp. rate 18, height 5\' 1"  (1.549 m), weight 46.1 kg, SpO2 98%. Gen: no distress, normal appearing HEENT: oral mucosa pink and moist, NCAT Cardio: Reg rate Chest: normal effort, normal rate of breathing Abd: soft, non-distended Ext: no edema Psych: flat  Skin: + Scaling dried skin on head present-2 small excoriations, sore on the base of the 5th metatarsal head, rash on face . stage 2 to right toe, area of redness between left thumb and index finger-covered in dressing, left buttock unstageable area not viewed +redness to bilateral elbows  Neurologic: minimal engagement. Makes eye contact Tone:  MAS 1 right elbow,MAS 1 in RIght  finger and wrist flexors  MAS 3 knee flexors, and MAS 3 L>R  hip adductors  MAS 3 L elbow , MAS 3-4 in L wrist and finger flexors  + LUE WHO---  Neurological exam stable today 02/19/2023   Motor 0/5 strength in LUE and BLE, Musculoskeletal: reduced bilateral shoulder and elbow ROM due to increased tone , pain with passive knee extension bilaterally   .   Assessment/Plan: 1. Functional deficits which require 3+ hours per day of interdisciplinary therapy in a comprehensive inpatient rehab setting. Physiatrist is providing close team supervision and 24 hour management of active medical problems listed  below. Physiatrist and rehab team continue to assess barriers to discharge/monitor patient progress toward functional and medical goals  Care Tool:  Bathing    Body parts bathed by patient: Face, Abdomen, Chest, Left upper leg, Right upper leg   Body parts bathed by helper: Right arm, Left arm, Front perineal area, Buttocks, Left lower leg, Right lower leg Body parts n/a: Right arm, Left arm, Front perineal area, Buttocks, Right upper leg, Left upper leg, Right lower leg, Left lower leg   Bathing assist Assist Level: Maximal Assistance - Patient 24 - 49%     Upper Body Dressing/Undressing Upper body dressing   What is the patient wearing?: Pull over shirt    Upper body assist Assist Level: Maximal Assistance - Patient 25 - 49%    Lower Body Dressing/Undressing Lower body dressing      What is the patient wearing?: Pants, Incontinence brief     Lower body assist Assist for lower body dressing: Dependent - Patient 0%     Toileting Toileting Toileting Activity did not occur (Clothing management and hygiene only): N/A (no void or bm)  Toileting assist Assist for toileting: Total Assistance - Patient < 25%     Transfers Chair/bed transfer  Transfers assist  Chair/bed transfer activity did not occur: Safety/medical concerns  Chair/bed transfer assist level: Total Assistance - Patient < 25%     Locomotion Ambulation   Ambulation assist   Ambulation activity did not occur: Safety/medical concerns  Assist level: 2 helpers   Max distance: 51ft   Walk 10 feet activity   Assist  Walk 10 feet activity did not occur: Safety/medical concerns  Assist level: 2 helpers     Walk 50 feet activity   Assist Walk 50 feet with 2 turns activity did not occur: Safety/medical concerns         Walk 150 feet activity   Assist Walk 150 feet activity did not occur: Safety/medical concerns         Walk 10 feet on uneven surface  activity   Assist Walk 10 feet  on uneven surfaces activity did not occur: Safety/medical concerns         Wheelchair     Assist Is the patient using a wheelchair?: Yes Type of Wheelchair: Manual    Wheelchair assist level: Dependent - Patient 0% Max wheelchair distance: 37ft    Wheelchair 50 feet with 2 turns activity    Assist    Wheelchair 50 feet with 2 turns activity did not occur: Safety/medical concerns   Assist Level: Dependent - Patient 0%   Wheelchair 150 feet activity     Assist  Wheelchair 150 feet activity did not occur: Safety/medical concerns   Assist Level: Dependent - Patient 0%   Blood pressure 111/69, pulse 75, temperature 98.4 F (36.9 C), resp. rate 18, height 5\' 1"  (1.549 m), weight 46.1 kg,  SpO2 98%.  Medical Problem List and Plan: 1. 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 Code status  DNR Continue CIR therapies including PT, OT SLP  2.  Impaired mobility: continue Lovenox, weekly creatinine ordered             -antiplatelet therapy: N/A  3. Knee paint: Tylenol as needed, voltaren gel prn\\Prescribing Home Zynex NexWave Stimulator Device and supplies as needed. IFC, NMES and TENS medically necessary Treatment Rx: Daily @ 30-40 minutes per treatment PRN. Zynex NexWave only, no substitutions. Treatment Goals: 1) To reduce and/or eliminate pain 2) To improve functional capacity and Activities of daily living 3) To reduce or prevent the need for oral medications 4) To improve circulation  in the injured region 5) To decrease or prevent muscle spasm and muscle atrophy 6) To provide a self-management tool to the patient The patient has not sufficiently improved with conservative care. Numerous studies indexed by Medline and PubMed.gov have shown Neuromuscular, Interferential, and TENS stimulators to reduce pain, improve function, and reduce medication use in injured patients. Continued use of this evidence based, safe, drug free treatment is both reasonable and medically necessary at this time.   Scheduled voltaren gel  XR reviewed and is stable  4. History of anxiety: discussed with her mom that she feels this was a big risk factor for her accident  5. Neuropsych/cognition: This patient is not capable of making decisions on her own behalf. 6. Left buttock unstageable ulcer.  continue Medihoney to buttock wound daily cover with foam dressing.  Changing dressing every 3 days or as needed soiling   7. Fluids/Electrolytes/Nutrition: Routine in and outs with follow-up chemistries  -Eating 100% most meals with assist and encouragement  8.  Cavitary right lower lobe pneumonia likely aspiration pneumonia/MRSA pneumonia.  Resolved    Latest Ref Rng & Units 02/05/2023   11:12 AM 01/22/2023    6:36 AM 12/20/2022   10:54 AM  CBC  WBC 4.0 - 10.5 K/uL 8.5  8.1  5.6   Hemoglobin 12.0 - 15.0 g/dL 86.5  78.4  69.6   Hematocrit 36.0 - 46.0 % 46.7  47.5  42.8   Platelets 150 - 400 K/uL 284  274  253      9.  History of drug abuse.  Positive cocaine on urine drug screen.  Provide counseling  10.  AKI/hypovolemia and ATN.  resolved    Latest Ref Rng & Units 02/16/2023    7:49 AM 02/09/2023    5:57 AM 02/05/2023   11:12 AM  BMP  Glucose 70 - 99 mg/dL   295   BUN 6 - 20 mg/dL   13   Creatinine 2.84 - 1.00 mg/dL 1.32  4.40  1.02   Sodium 135 - 145 mmol/L   138   Potassium 3.5 - 5.1 mmol/L   4.0   Chloride 98 - 111 mmol/L   103   CO2 22 - 32 mmol/L   28  Calcium 8.9 - 10.3 mg/dL   9.0      11.  Mild transaminitis with rhabdomyolysis.  Both resolved  12.  Hypotension. Resolved, d/c midodrine  BP reviewed and is stable.  13. Impaired initiation: continue amantadine 100mg  BID  14. Suboptimal vitamin D: continue 1,000U D3 daily  15. Spasticity:   -discussed serial casting with OT and her mom  - continue baclofen to 20mg  TID and tizanidine to 4mg  TID. Continue wrist, elbow brace, and PRAFOs. Ordered a night splint for LLE  Repeat CMP today given chronic tizanidine use  -9/7 continues to have significant tone despite use of tizanidine and baclofen, may be beneficial consider baclofen pump in outpatient setting if this causes significant difficulties in care  9/8 consider decrease baclofen if dyskinetic movements continue  10/5-6  continue orthotics, ROM, positioning  when possible 16. Bowel and bladder incontinence: continue bowel and bladder program   17. Left hand skin maceration: Eucerin ordered, discussed hand split with OT, conitnue  18. History of magnesium deficiency: magnesium 500mg  started HS    02/19/2023    4:45 AM 02/18/2023    7:35 PM 02/18/2023    1:01 PM  Vitals with BMI  Systolic 111 96 98  Diastolic 69 66 79  Pulse 75 78 76    19. HTN: see above Increase tizanidine to 6mg  TID. Magnesium supplement added HS. Increase propanolol to 20mg  TID, continue this dose      02/19/2023    4:45 AM 02/18/2023    7:35 PM 02/18/2023    1:01 PM  Vitals with BMI  Systolic 111 96 98  Diastolic 69 66 79  Pulse 75 78 76     20. Area of redness between left thumb and index finger: continue foam dressing  21. Scalp eczema: selsun blue ordered, continue  -9/7 patient's mother feels like this is causing her to itch, will start some hydrocortisone cream  22. Fatigue: improved, discussed with team may be secondary to anti-spasticity medications, B12 reviewed and is 335, in suboptimal range, will give 1,000U B12 injection 8/7, will order monthly as needed, metanx  started, increase to BID, continue  23. Right wrist swelling: XR ordered and shows no acute fracture, shows 2.73mm ulnar variance, brace removed to given patient a break from it. Voltaren gel ordered prn for discomfort, continue, CT ordered and show no acute osseus injury, continue ace wrap  23. Dysphagia: continue D3/thins, d/c ensure as blood pressure severely elevates whenever restarted  24. MRSA nares positive: negative on repeat, precautions d/ced  25. Tachycardic: resolved, increase propanolol to BID.  continueTizanidine increased to 6mg  TID. Resolved, amantadine increased back to 100mg  BID, continue this dose  26.  Dyskinetic movements in head and neck - appears to be myoclonic jerks related to hypoxic brain injury - as discussed with pt mom, no specific meds for this on antispasticity meds , consioder benzo although with pt's SUD would avoid this class Appear to have resolved, EEG reviewed and was negative for seizure.   27. Small sore on the base of 5th metatarsal head: local wound care, continue  28. Rash on face: resolved, discussed with mother could have been secondary to added sugars in nutritional supplements  29. Sacral pressure injury: air mattress ordered, Placed nursing order to offload q1 hour while awake, continue medihoney, wound care consulted, discussed with nursing that this is improving  30. Cervical extensor weakness: tried decreasing tizanidine but spasticity appears increased and BP elevated so will increase back to 6mg  TID,  continue this dose  31. Low protein stores: prosource ordered, continue  32. Intermittent lethargy: labs reviewed and are stable, discussed with team may be secondary to anti-spasticity medications  33. Blood blister to left heel: continue foam heel protectors.   34. Suboptimal vitamin D level: increase to 5,000 U daily.    LOS: 93 days A FACE TO FACE EVALUATION WAS PERFORMED  Nicole [name] 02/19/2023, 10:42 AM

## 2023-02-19 NOTE — Progress Notes (Signed)
Occupational Therapy Session Note  Patient Details  Name: MEREDITH KILBRIDE MRN: [id number] Date of Birth: 02-21-1976  Today's Date: 02/19/2023 OT Individual Time: 3875-6433 OT Individual Time Calculation (min): 39 min    Short Term Goals: Week 3:  OT Short Term Goal 1 (Week 3): Patient will wash upper legs after set up and verbal cue with min assist OT Short Term Goal 1 - Progress (Week 3): Other (comment) OT Short Term Goal 2 (Week 3): Patient will reach toward lower leg and foot with min assist using RUE in prep for lower body self care OT Short Term Goal 2 - Progress (Week 3): Other (comment) OT Short Term Goal 3 (Week 3): Patient will utilize LUE for part of one functional task with min assist OT Short Term Goal 3 - Progress (Week 3): Other (comment)  Skilled Therapeutic Interventions/Progress Updates:   Patient received seated in bed with neck flexed to right.  Removed bilateral ankle/ foot supports, elbow and hand brace (left.)  Patient agreeable to get out of bed.  Dressed LB in bed - patient assisting with holding the bed rail on both sides for maintaining sidelying position.  Then transitioned to sitting with total assist - but tolerating movement well.  Patient transferred to wheelchair with total assist.  Taken to sink to brush teeth.  Patient grasped toothbrush and oriented to left and then right side of mouth/ top and bottom teeth!  Patient declined washing face, chest, arms - stating washcloth smelled funny.   Patient given ball point pen - patient able to click pen to release tip, then when asked to write her name wrote Dashayla HIL and then became distracted.  Improved participation in familiar functional activity noted today.  Left up in wheelchair with personal items in reach.    Therapy Documentation Precautions:  Precautions Precautions: Fall Precaution Comments: Contact precautions & delayed processing Required Braces or Orthoses: Other Brace Other Brace: B adjustable night  splints; B elbow "cosey" splints; L palm protector; R mitt Restrictions Weight Bearing Restrictions: No   Pain:  Indicated no pain       Therapy/Group: Individual Therapy  [name] 02/19/2023, 12:28 PM

## 2023-02-20 NOTE — Progress Notes (Signed)
PROGRESS NOTE   Subjective/Complaints: No new complaints this morning Much more communicative Tolerating therapy well with MaxA to MaxAx2 and ModA for seating balance!  ROS: Limited due to cognitive/behavioral   Objective:   No results found. No results for input(s): "WBC", "HGB", "HCT", "PLT" in the last 72 hours.     No results for input(s): "NA", "K", "CL", "CO2", "GLUCOSE", "BUN", "CREATININE", "CALCIUM" in the last 72 hours.          No intake or output data in the 24 hours ending 02/20/23 1331     Pressure Injury 10/31/22 Buttocks Left Unstageable - Full thickness tissue loss in which the base of the injury is covered by slough (yellow, tan, gray, green or brown) and/or eschar (tan, brown or black) in the wound bed. (Active)  10/31/22 2000  Location: Buttocks  Location Orientation: Left  Staging: Unstageable - Full thickness tissue loss in which the base of the injury is covered by slough (yellow, tan, gray, green or brown) and/or eschar (tan, brown or black) in the wound bed.  Wound Description (Comments):   Present on Admission: Yes     Pressure Injury 11/18/22 Toe (Comment  which one) Anterior;Right Deep Tissue Pressure Injury - Purple or maroon localized area of discolored intact skin or blood-filled blister due to damage of underlying soft tissue from pressure and/or shear. small are (Active)  11/18/22 1300  Location: Toe (Comment  which one)  Location Orientation: Anterior;Right  Staging: Deep Tissue Pressure Injury - Purple or maroon localized area of discolored intact skin or blood-filled blister due to damage of underlying soft tissue from pressure and/or shear.  Wound Description (Comments): small area on right great toe metatarsal  Present on Admission: Yes     Pressure Injury 02/09/23 Heel Left Deep Tissue Pressure Injury - Purple or maroon localized area of discolored intact skin or  blood-filled blister due to damage of underlying soft tissue from pressure and/or shear. Black/purple and boggy,  size of a quar (Active)  02/09/23 0543  Location: Heel  Location Orientation: Left  Staging: Deep Tissue Pressure Injury - Purple or maroon localized area of discolored intact skin or blood-filled blister due to damage of underlying soft tissue from pressure and/or shear.  Wound Description (Comments): Black/purple and boggy,  size of a quarter.  Foam dressing applied  Present on Admission: No    Physical Exam: Vital Signs Blood pressure 110/79, pulse 73, temperature 98.9 F (37.2 C), temperature source Oral, resp. rate 18, height 5\' 1"  (1.549 m), weight 46.1 kg, SpO2 98%. Gen: no distress, normal appearing HEENT: oral mucosa pink and moist, NCAT Cardio: Reg rate Chest: normal effort, normal rate of breathing Abd: soft, non-distended Ext: no edema Psych: pleasant, normal affect  Psych: flat  Skin: + Scaling dried skin on head present-2 small excoriations, sore on the base of the 5th metatarsal head, rash on face . stage 2 to right toe, area of redness between left thumb and index finger-covered in dressing, left buttock unstageable area not viewed +redness to bilateral elbows  Neurologic: minimal engagement. Makes eye contact Tone:  MAS 1 right elbow,MAS 1 in RIght finger and wrist flexors  MAS 3  knee flexors, and MAS 3 L>R  hip adductors  MAS 3 L elbow , MAS 3-4 in L wrist and finger flexors  + LUE WHO---  Neurological exam stable today 02/20/2023   Motor 0/5 strength in LUE and BLE, Musculoskeletal: reduced bilateral shoulder and elbow ROM due to increased tone , pain with passive knee extension bilaterally   .   Assessment/Plan: 1. Functional deficits which require 3+ hours per day of interdisciplinary therapy in a comprehensive inpatient rehab setting. Physiatrist is providing close team supervision and 24 hour management of active medical problems listed  below. Physiatrist and rehab team continue to assess barriers to discharge/monitor patient progress toward functional and medical goals  Care Tool:  Bathing    Body parts bathed by patient: Face, Abdomen, Chest, Left upper leg, Right upper leg   Body parts bathed by helper: Right arm, Left arm, Front perineal area, Buttocks, Left lower leg, Right lower leg Body parts n/a: Right arm, Left arm, Front perineal area, Buttocks, Right upper leg, Left upper leg, Right lower leg, Left lower leg   Bathing assist Assist Level: Maximal Assistance - Patient 24 - 49%     Upper Body Dressing/Undressing Upper body dressing   What is the patient wearing?: Pull over shirt    Upper body assist Assist Level: Maximal Assistance - Patient 25 - 49%    Lower Body Dressing/Undressing Lower body dressing      What is the patient wearing?: Pants, Incontinence brief     Lower body assist Assist for lower body dressing: Dependent - Patient 0%     Toileting Toileting Toileting Activity did not occur (Clothing management and hygiene only): N/A (no void or bm)  Toileting assist Assist for toileting: Total Assistance - Patient < 25%     Transfers Chair/bed transfer  Transfers assist  Chair/bed transfer activity did not occur: Safety/medical concerns  Chair/bed transfer assist level: Total Assistance - Patient < 25%     Locomotion Ambulation   Ambulation assist   Ambulation activity did not occur: Safety/medical concerns  Assist level: 2 helpers   Max distance: 49ft   Walk 10 feet activity   Assist  Walk 10 feet activity did not occur: Safety/medical concerns  Assist level: 2 helpers     Walk 50 feet activity   Assist Walk 50 feet with 2 turns activity did not occur: Safety/medical concerns         Walk 150 feet activity   Assist Walk 150 feet activity did not occur: Safety/medical concerns         Walk 10 feet on uneven surface  activity   Assist Walk 10 feet  on uneven surfaces activity did not occur: Safety/medical concerns         Wheelchair     Assist Is the patient using a wheelchair?: Yes Type of Wheelchair: Manual    Wheelchair assist level: Dependent - Patient 0% Max wheelchair distance: 26ft    Wheelchair 50 feet with 2 turns activity    Assist    Wheelchair 50 feet with 2 turns activity did not occur: Safety/medical concerns   Assist Level: Dependent - Patient 0%   Wheelchair 150 feet activity     Assist  Wheelchair 150 feet activity did not occur: Safety/medical concerns   Assist Level: Dependent - Patient 0%   Blood pressure 110/79, pulse 73, temperature 98.9 F (37.2 C), temperature source Oral, resp. rate 18, height 5\' 1"  (1.549 m), weight 46.1 kg, SpO2 98%.  Medical  Problem List and Plan: 1. 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 Code status  DNR Continue CIR therapies including PT, OT SLP  2.  Impaired mobility: continue Lovenox, weekly creatinine ordered             -antiplatelet therapy: N/A  3. Knee paint: Tylenol as needed, voltaren gel prn\\Prescribing Home Zynex NexWave Stimulator Device and supplies as needed. IFC, NMES and TENS medically necessary Treatment Rx: Daily @ 30-40 minutes per treatment PRN. Zynex NexWave only, no substitutions. Treatment Goals: 1) To reduce and/or eliminate pain 2) To improve functional capacity and Activities of daily living 3) To reduce or prevent the need for oral medications 4)  To improve circulation in the injured region 5) To decrease or prevent muscle spasm and muscle atrophy 6) To provide a self-management tool to the patient The patient has not sufficiently improved with conservative care. Numerous studies indexed by Medline and PubMed.gov have shown Neuromuscular, Interferential, and TENS stimulators to reduce pain, improve function, and reduce medication use in injured patients. Continued use of this evidence based, safe, drug free treatment is both reasonable and medically necessary at this time.   Scheduled voltaren gel  XR reviewed and is stable  4. History of anxiety: discussed with her mom that she feels this was a big risk factor for her accident  5. Neuropsych/cognition: This patient is not capable of making decisions on her own behalf. 6. Left buttock unstageable ulcer.  continue Medihoney to buttock wound daily cover with foam dressing.  Changing dressing every 3 days or as needed soiling   7. Fluids/Electrolytes/Nutrition: Routine in and outs with follow-up chemistries  -Eating 100% most meals with assist and encouragement  8.  Cavitary right lower lobe pneumonia likely aspiration pneumonia/MRSA pneumonia.  Resolved    Latest Ref Rng & Units 02/05/2023   11:12 AM 01/22/2023    6:36 AM 12/20/2022   10:54 AM  CBC  WBC 4.0 - 10.5 K/uL 8.5  8.1  5.6   Hemoglobin 12.0 - 15.0 g/dL 47.8  29.5  62.1   Hematocrit 36.0 - 46.0 % 46.7  47.5  42.8   Platelets 150 - 400 K/uL 284  274  253      9.  History of drug abuse.  Positive cocaine on urine drug screen.  Provide counseling  10.  AKI/hypovolemia and ATN.  resolved    Latest Ref Rng & Units 02/16/2023    7:49 AM 02/09/2023    5:57 AM 02/05/2023   11:12 AM  BMP  Glucose 70 - 99 mg/dL   308   BUN 6 - 20 mg/dL   13   Creatinine 6.57 - 1.00 mg/dL 8.46  9.62  9.52   Sodium 135 - 145 mmol/L   138   Potassium 3.5 - 5.1 mmol/L   4.0   Chloride 98 - 111 mmol/L   103   CO2 22 - 32 mmol/L   28   Calcium 8.9  -  10.3 mg/dL   9.0     11.  Mild transaminitis with rhabdomyolysis.  Both resolved  12.  Hypotension. Resolved, d/c midodrine  BP reviewed and is stable.  13. Impaired initiation: continue amantadine 100mg  BID  14. Suboptimal vitamin D: continue 1,000U D3 daily  15. Spasticity:   -discussed serial casting with OT and her mom  - continue baclofen to 20mg  TID and tizanidine to 4mg  TID. Continue wrist, elbow brace, and PRAFOs. Ordered a night splint for LLE  Repeat CMP today given chronic tizanidine use  -9/7 continues to have significant tone despite use of tizanidine and baclofen, may be beneficial consider baclofen pump in outpatient setting if this causes significant difficulties in care  9/8 consider decrease baclofen if dyskinetic movements continue  10/5-6  continue orthotics, ROM, positioning  when possible 16. Bowel and bladder incontinence: continue bowel and bladder program   17. Left hand skin maceration: Eucerin ordered, discussed hand split with OT, conitnue  18. History of magnesium deficiency: magnesium 500mg  started HS    02/20/2023    6:38 AM 02/19/2023    8:25 PM 02/19/2023    4:45 AM  Vitals with BMI  Systolic 110 139 829  Diastolic 79 102 69  Pulse 73 91 75    19. HTN: see above Increase tizanidine to 6mg  TID. Magnesium supplement added HS. Increase propanolol to 20mg  TID, continue this dose      02/20/2023    6:38 AM 02/19/2023    8:25 PM 02/19/2023    4:45 AM  Vitals with BMI  Systolic 110 139 562  Diastolic 79 102 69  Pulse 73 91 75     20. Area of redness between left thumb and index finger: continue foam dressing  21. Scalp eczema: selsun blue ordered, continue  -9/7 patient's mother feels like this is causing her to itch, will start some hydrocortisone cream  22. Fatigue: improved, discussed with team may be secondary to anti-spasticity medications, B12 reviewed and is 335, in suboptimal range, will give 1,000U B12 injection 8/7, will order  monthly as needed, metanx started, increase to BID, continue  23. Right wrist swelling: XR ordered and shows no acute fracture, shows 2.62mm ulnar variance, brace removed to given patient a break from it. Voltaren gel ordered prn for discomfort, continue, CT ordered and show no acute osseus injury, continue ace wrap  23. Dysphagia: continue D3/thins, d/c ensure as blood pressure severely elevates whenever restarted  24. MRSA nares positive: negative on repeat, precautions d/ced  25. Tachycardic: resolved, increase propanolol to BID.  continueTizanidine increased to 6mg  TID. Resolved, amantadine increased back to 100mg  BID, continue this dose  26.  Dyskinetic movements in head and neck - appears to be myoclonic jerks related to hypoxic brain injury - as discussed with pt mom, no specific meds for this on antispasticity meds , consioder benzo although with pt's SUD would avoid this class Appear to have resolved, EEG reviewed and was negative for seizure.   27. Small sore on the base of 5th metatarsal head: local wound care, continue  28. Rash on face: resolved, discussed with mother could have been secondary to added sugars in nutritional supplements  29. Sacral pressure injury: air mattress ordered, Placed nursing order to offload q1 hour while awake, continue medihoney, wound care consulted, discussed with nursing that this is improving  30. Cervical extensor weakness: tried decreasing tizanidine but spasticity appears increased and BP elevated so will increase back to 6mg  TID, continue this dose  31. Low protein stores: prosource ordered, continue  32. Intermittent lethargy: labs reviewed and are stable, discussed with team may be secondary to anti-spasticity medications  33. Blood blister to left heel: continue foam heel protectors.   34. Suboptimal vitamin D level: increase to 5,000 U daily. Continue this dose   LOS: 94 days A FACE TO FACE EVALUATION WAS PERFORMED  Clint Bolder P  Jaicey Sweaney 02/20/2023, 1:31 PM

## 2023-02-20 NOTE — Progress Notes (Signed)
Speech Language Pathology Daily Session Note  Patient Details  Name: Nicole [name] MRN: [id number] Date of Birth: 1975-08-13  Today's Date: 02/20/2023 SLP Individual Time: 7829-5621 SLP Individual Time Calculation (min): 23 min  Short Term Goals: Week 9: SLP Short Term Goal 1 (Week 9): Week 13 - Pt will present w/ appropriate response to functional stimuli (i.e. accept spoon, grasp basic item appropriately, response to tactile/auditory stimuli) 60% of the time w/ modA multimodal cues SLP Short Term Goal 2 (Week 9): Week 13 - Pt will answer open ended questions w/ modA in 70% of opportunities SLP Short Term Goal 3 (Week 9): Week 13 - Pt will answer basic yes/no questions with modA in 70% of opportunities SLP Short Term Goal 4 (Week 9): Week 13 - Pt will utilize increased vocal intensity w/ ModA verbal cues to remain 70% intelligibile at phrase level  Skilled Therapeutic Interventions: Skilled treatment session focused on cognitive and communication goals. Upon arrival, patient was awake in bed while listening to the news. Patient with language of confusion and confabulations, suspect due to information she was hearing from the television.  During a structured language task, patient was 100% intelligible at the word level, however, intelligibility was significantly impaired at the sentence level with Max A multimodal cues needed to achieve ~50% intelligibility. Patient with intermittent spasms, grimacing, and swearing throughout session due to patient reports of "pain all over." Nursing aware and patient was repositioned. Patient left upright in bed with all needs within reach. Continue with current plan of care.      Pain 9/10 pain "all over" Nursing aware and patient repositioned   Therapy/Group: Individual Therapy  Baylen Dea 02/20/2023, 3:23 PM

## 2023-02-20 NOTE — Progress Notes (Signed)
Physical Therapy Session Note  Patient Details  Name: TWYLIA OKA MRN: [id number] Date of Birth: 07-10-1975  Today's Date: 02/20/2023 PT Individual Time: 0803-0828 PT Individual Time Calculation (min): 25 min   Short Term Goals: Week 13: STG = LTGs  Skilled Therapeutic Interventions/Progress Updates:    Pt presents in room in bed, agreeable to PT. Pt denies pain initially but demonstrates pain with LE mobility and weightbearing during session. Session focused on bed mobility and transfer training as well as seated balance.  Pt completes donning pants with max assist by rolling bilaterally in bed with max assist, able to place RUE on hospital bed rail to maintain sidelying with cues and assist for hand placement. Pt completes supine to sit with max assist verbal cues for RUE hand placement and sequencing. Pt requires mod assist for seated balance EOB. Pt completes squat pivot transfer total assist bed to WC. Pt transported to to day room and completes squat pivot transfer total assist to mat. Pt completes x2 sit<>stands with max assist x2 pt does demonstrate some BLE muscle engagement however unable to achieve terminal knee/hip extension with pt demonstrating pain avoidant behaviors. Pt demonstrates posterior lean in sitting, cued to reach towards R shoe to promote anterior wt shift with pt able to demonstrate however unable to return to sitting upright without mod/max assist. Pt completes squat pivot total assist back to [facility] and transported back to room.  Pt returns to room and remains seated in [facility] with all needs within reach, cal light in place and chair alarm donned and activated at end of session, NT notified of pt position at end of session to eat breakfast.  Therapy Documentation Precautions:  Precautions Precautions: Fall Precaution Comments: Contact precautions & delayed processing Required Braces or Orthoses: Other Brace Other Brace: B adjustable night splints; B elbow "cosey" splints;  L palm protector; R mitt Restrictions Weight Bearing Restrictions: No   Therapy/Group: Individual Therapy  Edwin Cap 02/20/2023, 12:56 PM

## 2023-02-20 NOTE — Progress Notes (Signed)
Occupational Therapy Session Note  Patient Details  Name: MAISHA BOGEN MRN: [id number] Date of Birth: [birth year]  Today's Date: 02/20/2023 OT Individual Time: 1330-1400 OT Individual Time Calculation (min): 30 min    Short Term Goals: Week 9: OT Short Term Goal 1 (Week 9): Patient will change orientation of toothbrush/ hairbrush to accomodate task with min assist after set up OT Short Term Goal 2 (Week 9): Patient will spear food x 3 once utensil in hand, then bring to mouth with only contact assistance  Skilled Therapeutic Interventions/Progress Updates:    Pt greeted seated in TIS wc and agreeable to OT treatment session. Pt brought to the sink in wc and washed face with set-up A and cues for initiation. OT provided gentle stretching and ROM to B UE's. Squat-pivot transfer back to bed with total A of 1 person and max A for repositioning in bed. OT checked brief and it was clean and dry. Pt left semi-reclined in bed with bed alarm on, call bell in reach, and needs met.   Therapy Documentation Precautions:  Precautions Precautions: Fall Precaution Comments: Contact precautions & delayed processing Required Braces or Orthoses: Other Brace Other Brace: B adjustable night splints; B elbow "cosey" splints; L palm protector; R mitt Restrictions Weight Bearing Restrictions: No  Pain:  Pt winces in pain with transfer, no number given rest and repositioned for comfort.    Therapy/Group: Individual Therapy  Mal Amabile 02/20/2023, 2:03 PM

## 2023-02-21 NOTE — Patient Care Conference (Cosign Needed)
Inpatient RehabilitationTeam Conference and Plan of Care Update Date: 02/21/2023   Time: 11:14 AM    Patient Name: Nicole [name]      Medical Record Number: [id number]  Date of Birth: 08-Apr-1976 Sex: Female         Room/Bed: 4W09C/4W09C-01 Payor Info: Payor: [employer] / Plan: [employer] / Product Type: *No Product type* /    Admit Date/Time:  11/18/2022  3:15 PM  Primary Diagnosis:  Hypoxic brain injury [facility])  Hospital Problems: Principal Problem:   Hypoxic brain injury (HCC) Active Problems:   Protein-calorie malnutrition, severe   Essential hypertension   Pressure injury of skin    Expected Discharge Date: Expected Discharge Date:  (SNFpending)  Team Members Present: Physician leading conference: Dr. Sula Soda Social Worker Present: Dossie Der, LCSW Nurse Present: Chana Bode, RN PT Present: Wynelle Link, PT OT Present: Bretta Bang, OT SLP Present: [name], SLP PPS Coordinator present : Fae Pippin, SLP     Current Status/Progress Goal Weekly Team Focus  Bowel/Bladder   Pt is incontinent of b/b. LBM: 10/8   Develop habits of continence by timed voiding.   Assist w/ toileting needs q 2-4 hours and as needed.    Swallow/Nutrition/ Hydration               ADL's   Dependent to max assist BADL, Continued improvement with hand to mouth - use of utensil/cup, oral care   max assist   postural control, pain managment    Mobility   No updates - total to dependent care for functional mobility. Improved attention and alertness. LLE pain/discomfort limiting   TotA overall  Bed positioning, stretching, posture/head control    Communication   Improved verbal responses to ~60-70% of opportunities but continues to demonstrate decreased intelligibility requiring overall Mod-Max A verbal cues for use of strategies   modA   improving overall speech intelligibility with use of strategies    Safety/Cognition/ Behavioral  Observations  Overall Max A   Mod-Max A   sustained attention, orientation, initiation    Pain   Pt grimaces when you move her in bed, but she states that she's okay and not in pain. Pt has tylenol when needed.   Pt will verbalize if she's in pain.   Assess pain q shift & PRN.    Skin   Healed pressure ulcer on buttocks w/ foam dressing covering. L heel DTI - foam in place. R foot blister wound - foam in place.   [name] wounds will heal in a timely manner w/o complications.  Assess skin q shift & PRN.      Discharge Planning:    Awaiting long tern Scientist, research (physical sciences) notice  Team Discussion: Patient awaiting long term medicaid approval. Continue to note left knee pain at times; buttocks wound healing.   Patient on target to meet rehab goals: Currently dependent for care; requiring max assist for transfers and ADLs and mod - max assist for speech intelligibility although verbalizations have improved. Needs max assist for cognition and orientation.   *See Care Plan and progress notes for long and short-term goals.   Revisions to Treatment Plan:  N/a   Teaching Needs: Safety, skin care, medications, transfers, toileting,etc.   Current Barriers to Discharge: Incontinence, Insurance for SNF coverage, and awaiting long term medicaid approval.  Possible Resolutions to Barriers: SNF placement     Medical Summary               I  attest that I was present, lead the team conference, and concur with the assessment and plan of the team.   Chana Bode B 02/21/2023, 9:45 PM

## 2023-02-21 NOTE — Plan of Care (Signed)
  Problem: RH SKIN INTEGRITY Goal: RH STG MAINTAIN SKIN INTEGRITY WITH ASSISTANCE Description: STG Maintain Skin Integrity With min  Assistance. Outcome: Progressing   Problem: Consults Goal: RH STROKE PATIENT EDUCATION Description: See Patient Education module for education specifics  Outcome: Not Progressing   Problem: RH BOWEL ELIMINATION Goal: RH STG MANAGE BOWEL WITH ASSISTANCE Description: STG Manage Bowel with mod I Assistance. Outcome: Not Progressing Goal: RH STG MANAGE BOWEL W/MEDICATION W/ASSISTANCE Description: STG Manage Bowel with Medication with mod I Assistance. Outcome: Not Progressing   Problem: RH BLADDER ELIMINATION Goal: RH STG MANAGE BLADDER WITH ASSISTANCE Description: STG Manage Bladder With toileting Assistance Outcome: Not Progressing

## 2023-02-21 NOTE — Progress Notes (Signed)
Physical Therapy Session Note  Patient Details  Name: Nicole [name] MRN: [id number] Date of Birth: 1975-09-05  Today's Date: 02/21/2023 PT Individual Time: 0830-0900 PT Individual Time Calculation (min): 30 min   Short Term Goals: Week 1:  PT Short Term Goal 1 (Week 1): Week 13; pt will complete bed mobility with totalA (pt <25%) PT Short Term Goal 1 - Progress (Week 1): Not met PT Short Term Goal 2 (Week 1): Week 13: pt will maintain unsupported sitting balance for up to 1 minute consistently with CGA PT Short Term Goal 2 - Progress (Week 1): Not met PT Short Term Goal 3 (Week 1): Week 13: Pt will complete bed<>chair transfers with totalA (pt <25%) PT Short Term Goal 3 - Progress (Week 1): Not met  Skilled Therapeutic Interventions/Progress Updates:      Pt in bed with nursing present for morning medication. Pt agreeable to PT treatment - continues to have LLE pain/discomfort with PROM and mobility. MD made aware.   Pt assisted with dressing at totalA level for UB and LB dressing. Supine<>sitting EOB with totalA via log rolling technique - pt saying "give me your hand" when instructed on bed mobility. TotalA squat pivot transfer into TIS w/c and transported to day room rehab gym. Completed x1 sit<>stand with +2 dependent assist, blocking B knees. Pt with significant forward flexed posture at the hips, trunk, and knees, unable to get full upright standing and limited standing tolerance.  Returned to her room. NT requesting patient return to bed for brief change. Assisted back to bed in similar manner as above. Left in bed with all needs met.   Therapy Documentation Precautions:  Precautions Precautions: Fall Precaution Comments: Contact precautions & delayed processing Required Braces or Orthoses: Other Brace Other Brace: B adjustable night splints; B elbow "cosey" splints; L palm protector; R mitt Restrictions Weight Bearing Restrictions: No General:     Therapy/Group: Individual  Therapy  Rylend Pietrzak P Shima Compere 02/21/2023, 7:26 AM

## 2023-02-21 NOTE — Progress Notes (Signed)
Occupational Therapy Session Note  Patient Details  Name: Nicole [name] MRN: [id number] Date of Birth: June 04, 1975  Today's Date: 02/21/2023 OT Individual Time: 1132-1200 OT Individual Time Calculation (min): 28 min    Short Term Goals: Week 3:  OT Short Term Goal 1 (Week 3): Patient will wash upper legs after set up and verbal cue with min assist OT Short Term Goal 1 - Progress (Week 3): Other (comment) OT Short Term Goal 2 (Week 3): Patient will reach toward lower leg and foot with min assist using RUE in prep for lower body self care OT Short Term Goal 2 - Progress (Week 3): Other (comment) OT Short Term Goal 3 (Week 3): Patient will utilize LUE for part of one functional task with min assist OT Short Term Goal 3 - Progress (Week 3): Other (comment)  Skilled Therapeutic Interventions/Progress Updates:    Patient received supine in flat bed - punching at the air and kicking right leg - looking uncomfortable.  When asked what was wrong - patient indicated she was needing a doctor's prescription for her friend.  Redirected patient to situation.  Patient able to verbalize "Redge Gainer."  Patient assisted to transfer to wheelchair and transported to gym.  Worked on functional use of R hand.  Picking up and releasing objects, sorting red/black (field of 2)  Patient limited by perseverative behaviors- hand to nose, hand to hair - needed hand over hand guidance to try novel task.  Patient able to identify numbers with 75% accuracy and colors with 50% accuracy.  Able to write letters of her name, draw a star, draw a circle.  Returned to room and left up in wheelchair with head support and slightly tilted.  Worked on identifying TV and Nurse call buttons on call bell in lap.    Therapy Documentation Precautions:  Precautions Precautions: Fall Precaution Comments: Contact precautions & delayed processing Required Braces or Orthoses: Other Brace Other Brace: B adjustable night splints; B elbow "cosey"  splints; L palm protector; R mitt Restrictions Weight Bearing Restrictions: No   Pain: No report of pain.          Therapy/Group: Individual Therapy  [name] 02/21/2023, 12:11 PM

## 2023-02-21 NOTE — Progress Notes (Signed)
PROGRESS NOTE   Subjective/Complaints: No new complaints this morning Continues to have knee pain, XR results discussed with PT, she is receiving voltaren gel  ROS: Limited due to cognitive/behavioral   Objective:   No results found. No results for input(s): "WBC", "HGB", "HCT", "PLT" in the last 72 hours.     No results for input(s): "NA", "K", "CL", "CO2", "GLUCOSE", "BUN", "CREATININE", "CALCIUM" in the last 72 hours.           Intake/Output Summary (Last 24 hours) at 02/21/2023 1028 Last data filed at 02/21/2023 1191 Gross per 24 hour  Intake 1186 ml  Output 0 ml  Net 1186 ml       Pressure Injury 10/31/22 Buttocks Left Unstageable - Full thickness tissue loss in which the base of the injury is covered by slough (yellow, tan, gray, green or brown) and/or eschar (tan, brown or black) in the wound bed. (Active)  10/31/22 2000  Location: Buttocks  Location Orientation: Left  Staging: Unstageable - Full thickness tissue loss in which the base of the injury is covered by slough (yellow, tan, gray, green or brown) and/or eschar (tan, brown or black) in the wound bed.  Wound Description (Comments):   Present on Admission: Yes     Pressure Injury 11/18/22 Toe (Comment  which one) Anterior;Right Deep Tissue Pressure Injury - Purple or maroon localized area of discolored intact skin or blood-filled blister due to damage of underlying soft tissue from pressure and/or shear. small are (Active)  11/18/22 1300  Location: Toe (Comment  which one)  Location Orientation: Anterior;Right  Staging: Deep Tissue Pressure Injury - Purple or maroon localized area of discolored intact skin or blood-filled blister due to damage of underlying soft tissue from pressure and/or shear.  Wound Description (Comments): small area on right great toe metatarsal  Present on Admission: Yes     Pressure Injury 02/09/23 Heel Left Deep Tissue  Pressure Injury - Purple or maroon localized area of discolored intact skin or blood-filled blister due to damage of underlying soft tissue from pressure and/or shear. Black/purple and boggy,  size of a quar (Active)  02/09/23 0543  Location: Heel  Location Orientation: Left  Staging: Deep Tissue Pressure Injury - Purple or maroon localized area of discolored intact skin or blood-filled blister due to damage of underlying soft tissue from pressure and/or shear.  Wound Description (Comments): Black/purple and boggy,  size of a quarter.  Foam dressing applied  Present on Admission: No    Physical Exam: Vital Signs Blood pressure 113/86, pulse 69, temperature (!) 97.5 F (36.4 C), temperature source Oral, resp. rate 18, height 5\' 1"  (1.549 m), weight 46.1 kg, SpO2 100%. Gen: no distress, normal appearing HEENT: oral mucosa pink and moist, NCAT Cardio: Reg rate Chest: normal effort, normal rate of breathing Abd: soft, non-distended Ext: no edema Psych: pleasant, normal affect   Psych: flat  Skin: + Scaling dried skin on head present-2 small excoriations, sore on the base of the 5th metatarsal head, rash on face . stage 2 to right toe, area of redness between left thumb and index finger-covered in dressing, left buttock unstageable area not viewed +redness to bilateral elbows  Neurologic: minimal engagement. Makes eye contact Tone:  MAS 1 right elbow,MAS 1 in RIght finger and wrist flexors  MAS 3 knee flexors, and MAS 3 L>R  hip adductors  MAS 3 L elbow , MAS 3-4 in L wrist and finger flexors  + LUE WHO---  Neurological exam stable today 02/21/2023   Motor 0/5 strength in LUE and BLE, Musculoskeletal: reduced bilateral shoulder and elbow ROM due to increased tone , pain with passive knee extension bilaterally   .   Assessment/Plan: 1. Functional deficits which require 3+ hours per day of interdisciplinary therapy in a comprehensive inpatient rehab setting. Physiatrist is providing  close team supervision and 24 hour management of active medical problems listed below. Physiatrist and rehab team continue to assess barriers to discharge/monitor patient progress toward functional and medical goals  Care Tool:  Bathing    Body parts bathed by patient: Face, Abdomen, Chest, Left upper leg, Right upper leg   Body parts bathed by helper: Right arm, Left arm, Front perineal area, Buttocks, Left lower leg, Right lower leg Body parts n/a: Right arm, Left arm, Front perineal area, Buttocks, Right upper leg, Left upper leg, Right lower leg, Left lower leg   Bathing assist Assist Level: Maximal Assistance - Patient 24 - 49%     Upper Body Dressing/Undressing Upper body dressing   What is the patient wearing?: Pull over shirt    Upper body assist Assist Level: Maximal Assistance - Patient 25 - 49%    Lower Body Dressing/Undressing Lower body dressing      What is the patient wearing?: Pants, Incontinence brief     Lower body assist Assist for lower body dressing: Dependent - Patient 0%     Toileting Toileting Toileting Activity did not occur (Clothing management and hygiene only): N/A (no void or bm)  Toileting assist Assist for toileting: Total Assistance - Patient < 25%     Transfers Chair/bed transfer  Transfers assist  Chair/bed transfer activity did not occur: Safety/medical concerns  Chair/bed transfer assist level: Total Assistance - Patient < 25%     Locomotion Ambulation   Ambulation assist   Ambulation activity did not occur: Safety/medical concerns  Assist level: 2 helpers   Max distance: 17ft   Walk 10 feet activity   Assist  Walk 10 feet activity did not occur: Safety/medical concerns  Assist level: 2 helpers     Walk 50 feet activity   Assist Walk 50 feet with 2 turns activity did not occur: Safety/medical concerns         Walk 150 feet activity   Assist Walk 150 feet activity did not occur: Safety/medical  concerns         Walk 10 feet on uneven surface  activity   Assist Walk 10 feet on uneven surfaces activity did not occur: Safety/medical concerns         Wheelchair     Assist Is the patient using a wheelchair?: Yes Type of Wheelchair: Manual    Wheelchair assist level: Dependent - Patient 0% Max wheelchair distance: 81ft    Wheelchair 50 feet with 2 turns activity    Assist    Wheelchair 50 feet with 2 turns activity did not occur: Safety/medical concerns   Assist Level: Dependent - Patient 0%   Wheelchair 150 feet activity     Assist  Wheelchair 150 feet activity did not occur: Safety/medical concerns   Assist Level: Dependent - Patient 0%   Blood pressure 113/86, pulse 69, temperature (!)  97.5 F (36.4 C), temperature source Oral, resp. rate 18, height 5\' 1"  (1.549 m), weight 46.1 kg, SpO2 100%.  Medical Problem List and Plan: 1. 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 Code status  DNR Continue CIR therapies including PT, OT SLP  2.  Impaired mobility: continue Lovenox, weekly creatinine ordered             -antiplatelet therapy: N/A  3. Knee paint: Tylenol as needed, voltaren gel prn\\Prescribing Home Zynex NexWave Stimulator Device and supplies as needed. IFC, NMES and TENS medically necessary Treatment Rx: Daily @ 30-40 minutes per treatment PRN. Zynex NexWave only, no substitutions. Treatment Goals: 1) To reduce and/or eliminate pain 2) To improve functional  capacity and Activities of daily living 3) To reduce or prevent the need for oral medications 4) To improve circulation in the injured region 5) To decrease or prevent muscle spasm and muscle atrophy 6) To provide a self-management tool to the patient The patient has not sufficiently improved with conservative care. Numerous studies indexed by Medline and PubMed.gov have shown Neuromuscular, Interferential, and TENS stimulators to reduce pain, improve function, and reduce medication use in injured patients. Continued use of this evidence based, safe, drug free treatment is both reasonable and medically necessary at this time.   Scheduled voltaren gel  XR reviewed and is stable, discussed with therapy  4. History of anxiety: discussed with her mom that she feels this was a big risk factor for her accident  5. Neuropsych/cognition: This patient is not capable of making decisions on her own behalf. 6. Left buttock unstageable ulcer.  continue Medihoney to buttock wound daily cover with foam dressing.  Changing dressing every 3 days or as needed soiling   7. Fluids/Electrolytes/Nutrition: Routine in and outs with follow-up chemistries  -Eating 100% most meals with assist and encouragement  8.  Cavitary right lower lobe pneumonia likely aspiration pneumonia/MRSA pneumonia.  Resolved    Latest Ref Rng & Units 02/05/2023   11:12 AM 01/22/2023    6:36 AM 12/20/2022   10:54 AM  CBC  WBC 4.0 - 10.5 K/uL 8.5  8.1  5.6   Hemoglobin 12.0 - 15.0 g/dL 16.1  09.6  04.5   Hematocrit 36.0 - 46.0 % 46.7  47.5  42.8   Platelets 150 - 400 K/uL 284  274  253      9.  History of drug abuse.  Positive cocaine on urine drug screen.  Provide counseling  10.  AKI/hypovolemia and ATN.  resolved    Latest Ref Rng & Units 02/16/2023    7:49 AM 02/09/2023    5:57 AM 02/05/2023   11:12 AM  BMP  Glucose 70 - 99 mg/dL   409   BUN 6 - 20 mg/dL   13   Creatinine 8.11 - 1.00 mg/dL 9.14  7.82  9.56   Sodium 135 - 145  mmol/L   138   Potassium 3.5 - 5.1 mmol/L  4.0   Chloride 98 - 111 mmol/L   103   CO2 22 - 32 mmol/L   28   Calcium 8.9 - 10.3 mg/dL   9.0     11.  Mild transaminitis with rhabdomyolysis.  Both resolved  12.  Hypotension. Resolved, d/c midodrine  BP reviewed and is stable.  13. Impaired initiation: continue amantadine 100mg  BID  14. Hyperlycemia: CBGs mildly elevated, d/ced checks  15. Spasticity:   -discussed serial casting with OT and her mom  - continue baclofen to 20mg  TID and tizanidine to 4mg  TID. Continue wrist, elbow brace, and PRAFOs. Ordered a night splint for LLE  Repeat CMP today given chronic tizanidine use  -9/7 continues to have significant tone despite use of tizanidine and baclofen, may be beneficial consider baclofen pump in outpatient setting if this causes significant difficulties in care  9/8 consider decrease baclofen if dyskinetic movements continue  10/5-6  continue orthotics, ROM, positioning  when possible 16. Bowel and bladder incontinence: continue bowel and bladder program   17. Left hand skin maceration: Eucerin ordered, discussed hand split with OT, conitnue  18. History of magnesium deficiency: magnesium 500mg  started HS    02/21/2023    2:53 AM 02/20/2023    7:23 PM 02/20/2023    2:42 PM  Vitals with BMI  Systolic 113 109 161  Diastolic 86 76 96  Pulse 69 80 91    19. HTN: see above Increase tizanidine to 6mg  TID. Magnesium supplement added HS. Increase propanolol to 20mg  TID, continue this dose      02/21/2023    2:53 AM 02/20/2023    7:23 PM 02/20/2023    2:42 PM  Vitals with BMI  Systolic 113 109 096  Diastolic 86 76 96  Pulse 69 80 91     20. Area of redness between left thumb and index finger: continue foam dressing  21. Scalp eczema: selsun blue ordered, continue  -9/7 patient's mother feels like this is causing her to itch, will start some hydrocortisone cream  22. Fatigue: improved, discussed with team may be secondary to  anti-spasticity medications, B12 reviewed and is 335, in suboptimal range, will give 1,000U B12 injection 8/7, will order monthly as needed, metanx started, increase to BID, continue  23. Right wrist swelling: XR ordered and shows no acute fracture, shows 2.5mm ulnar variance, brace removed to given patient a break from it. Voltaren gel ordered prn for discomfort, continue, CT ordered and show no acute osseus injury, continue ace wrap  23. Dysphagia: continue D3/thins, d/c ensure as blood pressure severely elevates whenever restarted  24. MRSA nares positive: negative on repeat, precautions d/ced  25. Tachycardic: resolved, increase propanolol to BID.  continueTizanidine increased to 6mg  TID. Resolved, amantadine increased back to 100mg  BID, continue this dose  26.  Dyskinetic movements in head and neck - appears to be myoclonic jerks related to hypoxic brain injury - as discussed with pt mom, no specific meds for this on antispasticity meds , consioder benzo although with pt's SUD would avoid this class Appear to have resolved, EEG reviewed and was negative for seizure.   27. Small sore on the base of 5th metatarsal head: local wound care, continue  28. Rash on face: resolved, discussed with mother could have been secondary to added sugars in nutritional supplements  29. Sacral pressure injury: air mattress ordered, Placed nursing order to offload q1 hour while awake, continue medihoney, wound care consulted, discussed with nursing that this is improving  30. Cervical extensor weakness: tried decreasing tizanidine but spasticity appears increased and BP elevated so will increase back to 6mg  TID, continue this dose  31. Low protein stores: prosource ordered, continue  32. Intermittent lethargy: labs reviewed and are stable, discussed with team may be secondary to anti-spasticity medications  33. Blood blister to left heel: continue foam heel protectors.   34. Suboptimal vitamin D level:  increase to 5,000 U daily. Continue this dose   LOS: 95 days A FACE TO FACE EVALUATION WAS PERFORMED  Nicole [name] P Winfred Iiams 02/21/2023, 10:28 AM

## 2023-02-21 NOTE — Progress Notes (Signed)
Speech Language Pathology Daily Session Note  Patient Details  Name: Nicole [name] MRN: [id number] Date of Birth: Jan 27, 1976  Today's Date: 02/21/2023 SLP Individual Time: 1445-1530 SLP Individual Time Calculation (min): 45 min  Short Term Goals: Week 9: SLP Short Term Goal 1 (Week 9): Week 13 - Pt will present w/ appropriate response to functional stimuli (i.e. accept spoon, grasp basic item appropriately, response to tactile/auditory stimuli) 60% of the time w/ modA multimodal cues SLP Short Term Goal 2 (Week 9): Week 13 - Pt will answer open ended questions w/ modA in 70% of opportunities SLP Short Term Goal 3 (Week 9): Week 13 - Pt will answer basic yes/no questions with modA in 70% of opportunities SLP Short Term Goal 4 (Week 9): Week 13 - Pt will utilize increased vocal intensity w/ ModA verbal cues to remain 70% intelligibile at phrase level  Skilled Therapeutic Interventions: Skilled therapy session focused on cognitive and communicative goals. SLP facilitated session by providing mod cues to increase vocal intensity throughout session resulting in ~60% intelligibility at the phrase level. SLP continued to address communication through providing modA during open ended questions. Patient with verbal response following all questions, though occasionally required cues to increase vocal intensity to increase intelligibility. Patient independently answered 2/4 orientation questions including name and current city, though required mod-maxA to recall situation and date. At the end of the session, patient required maxA to recall location of floor and floor number indicative of memory deficits. Patient left in chair with call bell in hand. Continue POC.  Pain No reports of pain  Therapy/Group: Individual Therapy  Glema Takaki M.A., CF-SLP 02/21/2023, 12:38 PM

## 2023-02-22 MED ORDER — PROPRANOLOL HCL 20 MG PO TABS
10.0000 mg | ORAL_TABLET | Freq: Three times a day (TID) | ORAL | Status: DC
  Administered 2023-02-22 – 2023-02-27 (×15): 10 mg via ORAL
  Filled 2023-02-22 (×15): qty 1

## 2023-02-22 NOTE — Progress Notes (Signed)
Physical Therapy Session Note  Patient Details  Name: Nicole [name] MRN: [id number] Date of Birth: [birth year]  Today's Date: 02/22/2023 PT Individual Time: 1100-1130 PT Individual Time Calculation (min): 30 min   Short Term Goals: Week 1:  PT Short Term Goal 1 (Week 1): Week 13; pt will complete bed mobility with totalA (pt <25%) PT Short Term Goal 1 - Progress (Week 1): Not met PT Short Term Goal 2 (Week 1): Week 13: pt will maintain unsupported sitting balance for up to 1 minute consistently with CGA PT Short Term Goal 2 - Progress (Week 1): Not met PT Short Term Goal 3 (Week 1): Week 13: Pt will complete bed<>chair transfers with totalA (pt <25%) PT Short Term Goal 3 - Progress (Week 1): Not met  Skilled Therapeutic Interventions/Progress Updates:      Pt in bed to start. Pt trying to write on a notepad with a pen using her RUE - writing illegible and drawing stars over any letters that are written.   Began to assist her to EOB but noted patient to be incontinent with wet brief and pants. Bed level care for brief change and LB dressing. Patient trying to assist in pulling pants over hips with RUE.   Supine<>sitting with total/dependent assist. Squat pivot with 1 person dependent assist transfer to TIS w/c. Transported into Day room Rehab gym and setup at wheelchair level to work on volitional movement, problem solving, and sustained attention. Pt instructed in card reaching at shoulder height level - ~10% accurate in choosing the instructed card and often times needing hand over hand assist for initiation.   Returned to her room and reclined in TIS w/c with full lap tray on, all needs met.   Therapy Documentation Precautions:  Precautions Precautions: Fall Precaution Comments: Contact precautions & delayed processing Required Braces or Orthoses: Other Brace Other Brace: B adjustable night splints; B elbow "cosey" splints; L palm protector; R mitt Restrictions Weight Bearing  Restrictions: No General:       Therapy/Group: Individual Therapy  Orrin Brigham 02/22/2023, 7:42 AM

## 2023-02-22 NOTE — Progress Notes (Signed)
PROGRESS NOTE   Subjective/Complaints: Showed intentional leg movement in OT today! Tolerated therapy well today No issues overnight  ROS: Limited due to cognitive/behavioral   Objective:   No results found. No results for input(s): "WBC", "HGB", "HCT", "PLT" in the last 72 hours.     No results for input(s): "NA", "K", "CL", "CO2", "GLUCOSE", "BUN", "CREATININE", "CALCIUM" in the last 72 hours.           Intake/Output Summary (Last 24 hours) at 02/22/2023 1305 Last data filed at 02/22/2023 1247 Gross per 24 hour  Intake 473 ml  Output --  Net 473 ml       Pressure Injury 10/31/22 Buttocks Left Unstageable - Full thickness tissue loss in which the base of the injury is covered by slough (yellow, tan, gray, green or brown) and/or eschar (tan, brown or black) in the wound bed. (Active)  10/31/22 2000  Location: Buttocks  Location Orientation: Left  Staging: Unstageable - Full thickness tissue loss in which the base of the injury is covered by slough (yellow, tan, gray, green or brown) and/or eschar (tan, brown or black) in the wound bed.  Wound Description (Comments):   Present on Admission: Yes     Pressure Injury 11/18/22 Toe (Comment  which one) Anterior;Right Deep Tissue Pressure Injury - Purple or maroon localized area of discolored intact skin or blood-filled blister due to damage of underlying soft tissue from pressure and/or shear. small are (Active)  11/18/22 1300  Location: Toe (Comment  which one)  Location Orientation: Anterior;Right  Staging: Deep Tissue Pressure Injury - Purple or maroon localized area of discolored intact skin or blood-filled blister due to damage of underlying soft tissue from pressure and/or shear.  Wound Description (Comments): small area on right great toe metatarsal  Present on Admission: Yes     Pressure Injury 02/09/23 Heel Left Deep Tissue Pressure Injury - Purple or  maroon localized area of discolored intact skin or blood-filled blister due to damage of underlying soft tissue from pressure and/or shear. Black/purple and boggy,  size of a quar (Active)  02/09/23 0543  Location: Heel  Location Orientation: Left  Staging: Deep Tissue Pressure Injury - Purple or maroon localized area of discolored intact skin or blood-filled blister due to damage of underlying soft tissue from pressure and/or shear.  Wound Description (Comments): Black/purple and boggy,  size of a quarter.  Foam dressing applied  Present on Admission: No    Physical Exam: Vital Signs Blood pressure (!) 111/59, pulse 82, temperature 99.5 F (37.5 C), resp. rate 16, height 5\' 1"  (1.549 m), weight 46.1 kg, SpO2 100%. Gen: no distress, normal appearing HEENT: oral mucosa pink and moist, NCAT Cardio: Reg rate Chest: normal effort, normal rate of breathing Abd: soft, non-distended Ext: no edema Psych: flat  Skin: + Scaling dried skin on head present-2 small excoriations, sore on the base of the 5th metatarsal head, rash on face . stage 2 to right toe, area of redness between left thumb and index finger-covered in dressing, left buttock unstageable area not viewed +redness to bilateral elbows  Neurologic: minimal engagement. Makes eye contact Tone:  MAS 1 right elbow,MAS 1 in RIght  finger and wrist flexors  MAS 3 knee flexors, and MAS 3 L>R  hip adductors  MAS 3 L elbow , MAS 3-4 in L wrist and finger flexors  + LUE WHO---  Neurological exam stable today 02/22/2023   Motor 0/5 strength in LUE and BLE, Musculoskeletal: reduced bilateral shoulder and elbow ROM due to increased tone , pain with passive knee extension bilaterally   .   Assessment/Plan: 1. Functional deficits which require 3+ hours per day of interdisciplinary therapy in a comprehensive inpatient rehab setting. Physiatrist is providing close team supervision and 24 hour management of active medical problems listed  below. Physiatrist and rehab team continue to assess barriers to discharge/monitor patient progress toward functional and medical goals  Care Tool:  Bathing    Body parts bathed by patient: Face, Abdomen, Chest, Left upper leg, Right upper leg   Body parts bathed by helper: Right arm, Left arm, Front perineal area, Buttocks, Left lower leg, Right lower leg Body parts n/a: Right arm, Left arm, Front perineal area, Buttocks, Right upper leg, Left upper leg, Right lower leg, Left lower leg   Bathing assist Assist Level: Maximal Assistance - Patient 24 - 49%     Upper Body Dressing/Undressing Upper body dressing   What is the patient wearing?: Pull over shirt    Upper body assist Assist Level: Maximal Assistance - Patient 25 - 49%    Lower Body Dressing/Undressing Lower body dressing      What is the patient wearing?: Pants, Incontinence brief     Lower body assist Assist for lower body dressing: Dependent - Patient 0%     Toileting Toileting Toileting Activity did not occur (Clothing management and hygiene only): N/A (no void or bm)  Toileting assist Assist for toileting: Total Assistance - Patient < 25%     Transfers Chair/bed transfer  Transfers assist  Chair/bed transfer activity did not occur: Safety/medical concerns  Chair/bed transfer assist level: Total Assistance - Patient < 25%     Locomotion Ambulation   Ambulation assist   Ambulation activity did not occur: Safety/medical concerns  Assist level: 2 helpers   Max distance: 38ft   Walk 10 feet activity   Assist  Walk 10 feet activity did not occur: Safety/medical concerns  Assist level: 2 helpers     Walk 50 feet activity   Assist Walk 50 feet with 2 turns activity did not occur: Safety/medical concerns         Walk 150 feet activity   Assist Walk 150 feet activity did not occur: Safety/medical concerns         Walk 10 feet on uneven surface  activity   Assist Walk 10 feet  on uneven surfaces activity did not occur: Safety/medical concerns         Wheelchair     Assist Is the patient using a wheelchair?: Yes Type of Wheelchair: Manual    Wheelchair assist level: Dependent - Patient 0% Max wheelchair distance: 86ft    Wheelchair 50 feet with 2 turns activity    Assist    Wheelchair 50 feet with 2 turns activity did not occur: Safety/medical concerns   Assist Level: Dependent - Patient 0%   Wheelchair 150 feet activity     Assist  Wheelchair 150 feet activity did not occur: Safety/medical concerns   Assist Level: Dependent - Patient 0%   Blood pressure (!) 111/59, pulse 82, temperature 99.5 F (37.5 C), resp. rate 16, height 5\' 1"  (1.549 m), weight 46.1  kg, SpO2 100%.  Medical Problem List and Plan: 1. 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 Code status  DNR Continue CIR therapies including PT, OT SLP  2.  Impaired mobility: continue Lovenox, weekly creatinine ordered             -antiplatelet therapy: N/A  3. Knee paint: Tylenol as needed, voltaren gel prn\\Prescribing Home Zynex NexWave Stimulator Device and supplies as needed. IFC, NMES and TENS medically necessary Treatment Rx: Daily @ 30-40 minutes per treatment PRN. Zynex NexWave only, no substitutions. Treatment Goals: 1) To reduce and/or eliminate pain 2) To improve functional capacity and Activities of daily living 3) To reduce or prevent the need for oral medications 4) To improve  circulation in the injured region 5) To decrease or prevent muscle spasm and muscle atrophy 6) To provide a self-management tool to the patient The patient has not sufficiently improved with conservative care. Numerous studies indexed by Medline and PubMed.gov have shown Neuromuscular, Interferential, and TENS stimulators to reduce pain, improve function, and reduce medication use in injured patients. Continued use of this evidence based, safe, drug free treatment is both reasonable and medically necessary at this time.   Scheduled voltaren gel  XR reviewed and is stable, discussed with therapy  4. History of anxiety: discussed with her mom that she feels this was a big risk factor for her accident  5. Neuropsych/cognition: This patient is not capable of making decisions on her own behalf. 6. Left buttock unstageable ulcer.  continue Medihoney to buttock wound daily cover with foam dressing.  Changing dressing every 3 days or as needed soiling   7. Fluids/Electrolytes/Nutrition: Routine in and outs with follow-up chemistries  -Eating 100% most meals with assist and encouragement  8.  Cavitary right lower lobe pneumonia likely aspiration pneumonia/MRSA pneumonia.  Resolved    Latest Ref Rng & Units 02/05/2023   11:12 AM 01/22/2023    6:36 AM 12/20/2022   10:54 AM  CBC  WBC 4.0 - 10.5 K/uL 8.5  8.1  5.6   Hemoglobin 12.0 - 15.0 g/dL 08.6  57.8  46.9   Hematocrit 36.0 - 46.0 % 46.7  47.5  42.8   Platelets 150 - 400 K/uL 284  274  253      9.  History of drug abuse.  Positive cocaine on urine drug screen.  Provide counseling  10.  AKI/hypovolemia and ATN.  resolved    Latest Ref Rng & Units 02/16/2023    7:49 AM 02/09/2023    5:57 AM 02/05/2023   11:12 AM  BMP  Glucose 70 - 99 mg/dL   629   BUN 6 - 20 mg/dL   13   Creatinine 5.28 - 1.00 mg/dL 4.13  2.44  0.10   Sodium 135 - 145 mmol/L   138   Potassium 3.5 - 5.1 mmol/L   4.0   Chloride 98 - 111 mmol/L   103   CO2 22 - 32 mmol/L  28    Calcium 8.9 - 10.3 mg/dL   9.0     11.  Mild transaminitis with rhabdomyolysis.  Both resolved  12.  Hypotension. Resolved, d/c midodrine  BP reviewed and is stable.  13. Impaired initiation: continue amantadine 100mg  BID  14. Hyperlycemia: CBGs mildly elevated, d/ced checks  15. Spasticity:   -discussed serial casting with OT and her mom  - continue baclofen to 20mg  TID and tizanidine to 4mg  TID. Continue wrist, elbow brace, and PRAFOs. Ordered a night splint for LLE  Repeat CMP today given chronic tizanidine use  -9/7 continues to have significant tone despite use of tizanidine and baclofen, may be beneficial consider baclofen pump in outpatient setting if this causes significant difficulties in care  9/8 consider decrease baclofen if dyskinetic movements continue  10/5-6  continue orthotics, ROM, positioning  when possible 16. Bowel and bladder incontinence: continue bowel and bladder program   17. Left hand skin maceration: Eucerin ordered, discussed hand split with OT, conitnue  18. History of magnesium deficiency: magnesium 500mg  started HS    02/22/2023   12:57 PM 02/22/2023    5:04 AM 02/21/2023    9:11 PM  Vitals with BMI  Systolic 111 143 161  Diastolic 59 86 94  Pulse 82 79 86    19. HTN: see above Increase tizanidine to 6mg  TID. Magnesium supplement added HS. Increase propanolol to 20mg  TID, continue this dose      02/22/2023   12:57 PM 02/22/2023    5:04 AM 02/21/2023    9:11 PM  Vitals with BMI  Systolic 111 143 096  Diastolic 59 86 94  Pulse 82 79 86     20. Area of redness between left thumb and index finger: continue foam dressing  21. Scalp eczema: selsun blue ordered, continue  -9/7 patient's mother feels like this is causing her to itch, will start some hydrocortisone cream  22. Fatigue: improved, discussed with team may be secondary to anti-spasticity medications, B12 reviewed and is 335, in suboptimal range, will give 1,000U B12 injection  8/7, will order monthly as needed, metanx started, increase to BID, continue  23. Right wrist swelling: XR ordered and shows no acute fracture, shows 2.2mm ulnar variance, brace removed to given patient a break from it. Voltaren gel ordered prn for discomfort, continue, CT ordered and show no acute osseus injury, continue ace wrap  23. Dysphagia: continue D3/thins, d/c ensure as blood pressure severely elevates whenever restarted  24. MRSA nares positive: negative on repeat, precautions d/ced  25. Tachycardic: resolved, increase propanolol to BID.  continueTizanidine increased to 6mg  TID. Resolved, amantadine increased back to 100mg  BID, continue this dose  26.  Dyskinetic movements in head and neck - appears to be myoclonic jerks related to hypoxic brain injury - as discussed with pt mom, no specific meds for this on antispasticity meds , consioder benzo although with pt's SUD would avoid this class Appear to have resolved, EEG reviewed and was negative for seizure.   27. Small sore on the base of 5th metatarsal head: local wound care, continue  28. Rash on face: resolved, discussed with mother could have been secondary to added sugars in nutritional supplements  29. Sacral pressure injury: air mattress ordered, Placed nursing order to offload q1 hour while awake, continue medihoney, wound care consulted, discussed with nursing that this is improving  30. Cervical extensor weakness: tried decreasing tizanidine but spasticity appears increased and BP elevated so will increase back to 6mg  TID,  continue this dose  31. Low protein stores: prosource ordered, continue  32. Intermittent lethargy: labs reviewed and are stable, discussed with team may be secondary to anti-spasticity medications  33. Blood blister to left heel: continue foam heel protectors.   34. Suboptimal vitamin D level: increase to 5,000 U daily. Continue this dose  35. Hypotension: decrease propanolol to 10mg   TID   LOS: 96 days A FACE TO FACE EVALUATION WAS PERFORMED  Harrison Paulson P Alishah Schulte 02/22/2023, 1:05 PM

## 2023-02-22 NOTE — Progress Notes (Signed)
Occupational Therapy Session Note  Patient Details  Name: Nicole [name] MRN: [id number] Date of Birth: [birth year]  Today's Date: 02/22/2023 OT Individual Time: 8295-6213 OT Individual Time Calculation (min): 30 min    Short Term Goals: Week 13:  OT Short Term Goal 1 (Week 13): Patient will wash upper legs after set up and verbal cue with min assist OT Short Term Goal 2 (Week 13): Patient will reach toward lower leg and foot with min assist using RUE in prep for lower body self care OT Short Term Goal 3 (Week 13): Patient will utilize LUE for part of one functional task with min assist   Skilled Therapeutic Interventions/Progress Updates:    Patient received supine in bed - very drowsy - difficulty maintaining eyes open.  Patient awake - and agreeable to OT.  Changed position of bed to flat bed and patient became more alert.  Patient talking more as session progressed.  "I feel like I am being weird."  Patient able to verbalize [facility].   Patient handed wet washcloth and with cueing able to was face and right leg.  Patient lifted leg toward hand!  This is the first time showing intentional leg movement in OT session.   Changed patient into clean brief utilizing rolling and bridging as needed.  Patient stabilizing on bed rail with either hand to assist with sidelying position.   Patient left up in bed with call bell in reach.    Therapy Documentation Precautions:  Precautions Precautions: Fall Precaution Comments: Contact precautions & delayed processing Required Braces or Orthoses: Other Brace Other Brace: B adjustable night splints; B elbow "cosey" splints; L palm protector; R mitt Restrictions Weight Bearing Restrictions: No Pain: Pain Assessment Pain Scale: 0-10 Pain Score: 0-No pain     Therapy/Group: Individual Therapy  [name] 02/22/2023, 12:17 PM

## 2023-02-22 NOTE — Progress Notes (Signed)
Speech Language Pathology Daily Session Note  Patient Details  Name: Nicole [name] MRN: [id number] Date of Birth: Jul 10, 1975  Today's Date: 02/22/2023 SLP Individual Time: 1445-1530 SLP Individual Time Calculation (min): 45 min  Short Term Goals: Week 9: SLP Short Term Goal 1 (Week 9): Week 13 - Pt will present w/ appropriate response to functional stimuli (i.e. accept spoon, grasp basic item appropriately, response to tactile/auditory stimuli) 60% of the time w/ modA multimodal cues SLP Short Term Goal 2 (Week 9): Week 13 - Pt will answer open ended questions w/ modA in 70% of opportunities SLP Short Term Goal 3 (Week 9): Week 13 - Pt will answer basic yes/no questions with modA in 70% of opportunities SLP Short Term Goal 4 (Week 9): Week 13 - Pt will utilize increased vocal intensity w/ ModA verbal cues to remain 70% intelligibile at phrase level  Skilled Therapeutic Interventions: SLP conducted skilled therapy session targeting speech intelligibility and communication goals. Patient received in TIS chair and brought to speech office. Patient engaged in conversation, responding to open ended questions in 100% of opportunities with supervisionA. Patient noticeably more verbal than in previous sessions. Patient answered basic yes/no questions re: environmental situations in 100% of opportunities. Patient required maxA to utilize increased vocal intensity. SLP guided patient through bubble blowing task to promote stronger use of air flow and made connections back to speech to promote improved breath support when talking. Patient was left in chair with call bell in reach and chair alarm set. SLP will continue to target goals per plan of care.       Pain Pain Assessment Pain Scale: 0-10 Pain Score: 0-No pain  Therapy/Group: Individual Therapy  [name], M.A., CCC-SLP  [name] 02/22/2023, 3:49 PM

## 2023-02-22 NOTE — Progress Notes (Signed)
Patient ID: Nicole [name], female   DOB: [birth year], 47 y.o.   MRN: [id number]  Met with Mom and pt to update regarding team conference progress and continued wait for long term care medicaid. Reached out again to the Medicaid worker-Natasha to see status of medicaid await return call.

## 2023-02-23 LAB — CREATININE, SERUM
Creatinine, Ser: 0.62 mg/dL (ref 0.44–1.00)
GFR, Estimated: >60 mL/min (ref >60)

## 2023-02-23 NOTE — Plan of Care (Signed)
Goals added and/or upgraded due to progress  Problem: RH Cognition - SLP Goal: RH LTG Patient will demonstrate orientation with cues Description:  LTG:  Patient will demonstrate orientation to person/place/time/situation with cues (SLP)   Flowsheets (Taken 02/23/2023 0646) LTG Patient will demonstrate orientation to:  Place  Time LTG: Patient will demonstrate orientation using cueing (SLP): Moderate Assistance - Patient 50 - 74%   Problem: RH Expression Communication Goal: LTG Patient will increase speech intelligibility (SLP) Description: LTG: Patient will increase speech intelligibility at word/phrase/conversation level with cues, % of the time (SLP) Flowsheets Taken 02/23/2023 0646 by Huston Foley, CCC-SLP LTG: Patient will increase speech intelligibility (SLP): Moderate Assistance - Patient 50 - 74% Taken 11/20/2022 1713 by [name], CCC-SLP Level: Phrase   Problem: RH Attention Goal: LTG Patient will demonstrate this level of attention during functional activites (SLP) Description: LTG:  Patient will will demonstrate this level of attention during functional activites (SLP) Flowsheets (Taken 02/23/2023 [phone]) Patient will demonstrate during cognitive/linguistic activities the attention type of: Sustained Patient will demonstrate this level of attention during cognitive/linguistic activities in: Controlled LTG: Patient will demonstrate this level of attention during cognitive/linguistic activities with assistance of (SLP): Moderate Assistance - Patient 50 - 74% Number of minutes patient will demonstrate attention during cognitive/linguistic activities: 2 minutes   Goals Met due to progress  Problem: RH Pre-functional/Other (Specify) Goal: RH LTG SLP (Specify) 1 Description: RH LTG SLP (Specify) 1 Outcome: Completed/Met Goal: RH LTG SLP (Specify) 2 Description: RH LTG SLP (Specify) 2 Outcome: Completed/Met

## 2023-02-23 NOTE — Progress Notes (Signed)
PROGRESS NOTE   Subjective/Complaints: Resting comfortably Progressing very well with SLP Patient's chart reviewed- No issues reported overnight Vitals signs stable   ROS: Limited due to cognitive/behavioral   Objective:   No results found. No results for input(s): "WBC", "HGB", "HCT", "PLT" in the last 72 hours.     No results for input(s): "NA", "K", "CL", "CO2", "GLUCOSE", "BUN", "CREATININE", "CALCIUM" in the last 72 hours.           Intake/Output Summary (Last 24 hours) at 02/23/2023 1027 Last data filed at 02/23/2023 4098 Gross per 24 hour  Intake 830 ml  Output --  Net 830 ml       Pressure Injury 10/31/22 Buttocks Left Unstageable - Full thickness tissue loss in which the base of the injury is covered by slough (yellow, tan, gray, green or brown) and/or eschar (tan, brown or black) in the wound bed. (Active)  10/31/22 2000  Location: Buttocks  Location Orientation: Left  Staging: Unstageable - Full thickness tissue loss in which the base of the injury is covered by slough (yellow, tan, gray, green or brown) and/or eschar (tan, brown or black) in the wound bed.  Wound Description (Comments):   Present on Admission: Yes     Pressure Injury 11/18/22 Toe (Comment  which one) Anterior;Right Deep Tissue Pressure Injury - Purple or maroon localized area of discolored intact skin or blood-filled blister due to damage of underlying soft tissue from pressure and/or shear. small are (Active)  11/18/22 1300  Location: Toe (Comment  which one)  Location Orientation: Anterior;Right  Staging: Deep Tissue Pressure Injury - Purple or maroon localized area of discolored intact skin or blood-filled blister due to damage of underlying soft tissue from pressure and/or shear.  Wound Description (Comments): small area on right great toe metatarsal  Present on Admission: Yes     Pressure Injury 02/09/23 Heel Left Deep  Tissue Pressure Injury - Purple or maroon localized area of discolored intact skin or blood-filled blister due to damage of underlying soft tissue from pressure and/or shear. Black/purple and boggy,  size of a quar (Active)  02/09/23 0543  Location: Heel  Location Orientation: Left  Staging: Deep Tissue Pressure Injury - Purple or maroon localized area of discolored intact skin or blood-filled blister due to damage of underlying soft tissue from pressure and/or shear.  Wound Description (Comments): Black/purple and boggy,  size of a quarter.  Foam dressing applied  Present on Admission: No    Physical Exam: Vital Signs Blood pressure 123/68, pulse 88, temperature 98.4 F (36.9 C), resp. rate 19, height 5\' 1"  (1.549 m), weight 46.1 kg, SpO2 97%. Gen: no distress, normal appearing HEENT: oral mucosa pink and moist, NCAT Cardio: Reg rate Chest: normal effort, normal rate of breathing Abd: soft, non-distended Ext: no edema Psych: flat  Skin: + Scaling dried skin on head present-2 small excoriations, sore on the base of the 5th metatarsal head, rash on face . stage 2 to right toe, area of redness between left thumb and index finger-covered in dressing, left buttock unstageable area not viewed +redness to bilateral elbows  Neurologic: minimal engagement. Makes eye contact Tone:  MAS 1 right elbow,MAS  1 in RIght finger and wrist flexors  MAS 3 knee flexors, and MAS 3 L>R  hip adductors  MAS 3 L elbow , MAS 3-4 in L wrist and finger flexors  + LUE WHO---  Neurological exam stable today 02/23/2023   Motor 0/5 strength in LUE and BLE, Musculoskeletal: reduced bilateral shoulder and elbow ROM due to increased tone , pain with passive knee extension bilaterally   .   Assessment/Plan: 1. Functional deficits which require 3+ hours per day of interdisciplinary therapy in a comprehensive inpatient rehab setting. Physiatrist is providing close team supervision and 24 hour management of active  medical problems listed below. Physiatrist and rehab team continue to assess barriers to discharge/monitor patient progress toward functional and medical goals  Care Tool:  Bathing    Body parts bathed by patient: Face, Abdomen, Chest, Left upper leg, Right upper leg   Body parts bathed by helper: Right arm, Left arm, Front perineal area, Buttocks, Left lower leg, Right lower leg Body parts n/a: Right arm, Left arm, Front perineal area, Buttocks, Right upper leg, Left upper leg, Right lower leg, Left lower leg   Bathing assist Assist Level: Maximal Assistance - Patient 24 - 49%     Upper Body Dressing/Undressing Upper body dressing   What is the patient wearing?: Pull over shirt    Upper body assist Assist Level: Maximal Assistance - Patient 25 - 49%    Lower Body Dressing/Undressing Lower body dressing      What is the patient wearing?: Pants, Incontinence brief     Lower body assist Assist for lower body dressing: Dependent - Patient 0%     Toileting Toileting Toileting Activity did not occur (Clothing management and hygiene only): N/A (no void or bm)  Toileting assist Assist for toileting: Total Assistance - Patient < 25%     Transfers Chair/bed transfer  Transfers assist  Chair/bed transfer activity did not occur: Safety/medical concerns  Chair/bed transfer assist level: Total Assistance - Patient < 25%     Locomotion Ambulation   Ambulation assist   Ambulation activity did not occur: Safety/medical concerns  Assist level: 2 helpers   Max distance: 38ft   Walk 10 feet activity   Assist  Walk 10 feet activity did not occur: Safety/medical concerns  Assist level: 2 helpers     Walk 50 feet activity   Assist Walk 50 feet with 2 turns activity did not occur: Safety/medical concerns         Walk 150 feet activity   Assist Walk 150 feet activity did not occur: Safety/medical concerns         Walk 10 feet on uneven surface   activity   Assist Walk 10 feet on uneven surfaces activity did not occur: Safety/medical concerns         Wheelchair     Assist Is the patient using a wheelchair?: Yes Type of Wheelchair: Manual    Wheelchair assist level: Dependent - Patient 0% Max wheelchair distance: 48ft    Wheelchair 50 feet with 2 turns activity    Assist    Wheelchair 50 feet with 2 turns activity did not occur: Safety/medical concerns   Assist Level: Dependent - Patient 0%   Wheelchair 150 feet activity     Assist  Wheelchair 150 feet activity did not occur: Safety/medical concerns   Assist Level: Dependent - Patient 0%   Blood pressure 123/68, pulse 88, temperature 98.4 F (36.9 C), resp. rate 19, height 5\' 1"  (1.549 m),  weight 46.1 kg, SpO2 97%.  Medical Problem List and Plan: 1. 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 Code status  DNR Continue CIR therapies including PT, OT SLP  2.  Impaired mobility: continue Lovenox, weekly creatinine ordered             -antiplatelet therapy: N/A  3. Knee paint: Tylenol as needed, voltaren gel prn\\Prescribing Home Zynex NexWave Stimulator Device and supplies as needed. IFC, NMES and TENS medically necessary Treatment Rx: Daily @ 30-40 minutes per treatment PRN. Zynex NexWave only, no substitutions. Treatment Goals: 1) To reduce and/or eliminate pain 2) To improve functional capacity and Activities of daily living 3) To reduce or prevent the need for oral  medications 4) To improve circulation in the injured region 5) To decrease or prevent muscle spasm and muscle atrophy 6) To provide a self-management tool to the patient The patient has not sufficiently improved with conservative care. Numerous studies indexed by Medline and PubMed.gov have shown Neuromuscular, Interferential, and TENS stimulators to reduce pain, improve function, and reduce medication use in injured patients. Continued use of this evidence based, safe, drug free treatment is both reasonable and medically necessary at this time.   Scheduled voltaren gel  XR reviewed and is stable, discussed with therapy  4. History of anxiety: discussed with her mom that she feels this was a big risk factor for her accident  5. Neuropsych/cognition: This patient is not capable of making decisions on her own behalf. 6. Left buttock unstageable ulcer.  continue Medihoney to buttock wound daily cover with foam dressing.  Changing dressing every 3 days or as needed soiling   7. Fluids/Electrolytes/Nutrition: Routine in and outs with follow-up chemistries  -Eating 100% most meals with assist and encouragement  8.  Cavitary right lower lobe pneumonia likely aspiration pneumonia/MRSA pneumonia.  Resolved    Latest Ref Rng & Units 02/05/2023   11:12 AM 01/22/2023    6:36 AM 12/20/2022   10:54 AM  CBC  WBC 4.0 - 10.5 K/uL 8.5  8.1  5.6   Hemoglobin 12.0 - 15.0 g/dL 09.8  11.9  14.7   Hematocrit 36.0 - 46.0 % 46.7  47.5  42.8   Platelets 150 - 400 K/uL 284  274  253      9.  History of drug abuse.  Positive cocaine on urine drug screen.  Provide counseling  10.  AKI/hypovolemia and ATN.  resolved    Latest Ref Rng & Units 02/16/2023    7:49 AM 02/09/2023    5:57 AM 02/05/2023   11:12 AM  BMP  Glucose 70 - 99 mg/dL   829   BUN 6 - 20 mg/dL   13   Creatinine 5.62 - 1.00 mg/dL 1.30  8.65  7.84   Sodium 135 - 145 mmol/L   138   Potassium 3.5 - 5.1 mmol/L   4.0   Chloride 98 - 111 mmol/L   103    CO2 22 -  32 mmol/L   28   Calcium 8.9 - 10.3 mg/dL   9.0     11.  Mild transaminitis with rhabdomyolysis.  Both resolved  12.  Hypotension. Resolved, d/c midodrine  BP reviewed and is stable.  13. Impaired initiation: continue amantadine 100mg  BID  14. Hyperlycemia: CBGs mildly elevated, d/ced checks  15. Spasticity:   -discussed serial casting with OT and her mom  - continue baclofen to 20mg  TID and tizanidine to 4mg  TID. Continue wrist, elbow brace, and PRAFOs. Ordered a night splint for LLE  Repeat CMP today given chronic tizanidine use  -9/7 continues to have significant tone despite use of tizanidine and baclofen, may be beneficial consider baclofen pump in outpatient setting if this causes significant difficulties in care  9/8 consider decrease baclofen if dyskinetic movements continue  10/5-6  continue orthotics, ROM, positioning  when possible 16. Bowel and bladder incontinence: continue bowel and bladder program   17. Left hand skin maceration: Eucerin ordered, discussed hand split with OT, conitnue  18. History of magnesium deficiency: magnesium 500mg  started HS    02/23/2023    3:58 AM 02/22/2023    8:09 PM 02/22/2023   12:57 PM  Vitals with BMI  Systolic 123 95 111  Diastolic 68 69 59  Pulse 88 71 82    19. HTN: see above Increase tizanidine to 6mg  TID. Magnesium supplement added HS. Increase propanolol to 20mg  TID, continue this dose      02/23/2023    3:58 AM 02/22/2023    8:09 PM 02/22/2023   12:57 PM  Vitals with BMI  Systolic 123 95 111  Diastolic 68 69 59  Pulse 88 71 82     20. Area of redness between left thumb and index finger: continue foam dressing  21. Scalp eczema: selsun blue ordered, continue  -9/7 patient's mother feels like this is causing her to itch, will start some hydrocortisone cream  22. Fatigue: improved, discussed with team may be secondary to anti-spasticity medications, B12 reviewed and is 335, in suboptimal range, will  give 1,000U B12 injection 8/7, will order monthly as needed, metanx started, increase to BID, continue  23. Right wrist swelling: XR ordered and shows no acute fracture, shows 2.66mm ulnar variance, brace removed to given patient a break from it. Voltaren gel ordered prn for discomfort, continue, CT ordered and show no acute osseus injury, continue ace wrap  23. Dysphagia: continue D3/thins, d/c ensure as blood pressure severely elevates whenever restarted  24. MRSA nares positive: negative on repeat, precautions d/ced  25. Tachycardic: resolved, increase propanolol to BID.  continueTizanidine increased to 6mg  TID. Resolved, amantadine increased back to 100mg  BID, continue this dose  26.  Dyskinetic movements in head and neck - appears to be myoclonic jerks related to hypoxic brain injury - as discussed with pt mom, no specific meds for this on antispasticity meds , consioder benzo although with pt's SUD would avoid this class Appear to have resolved, EEG reviewed and was negative for seizure.   27. Small sore on the base of 5th metatarsal head: local wound care, continue  28. Rash on face: resolved, discussed with mother could have been secondary to added sugars in nutritional supplements  29. Sacral pressure injury: air mattress ordered, Placed nursing order to offload q1 hour while awake, continue medihoney, wound care consulted, discussed with nursing that this is improving  30. Cervical extensor weakness: tried decreasing tizanidine but spasticity appears increased and BP elevated so will increase back  to 6mg  TID, continue this dose  31. Low protein stores: prosource ordered, continue  32. Intermittent lethargy: labs reviewed and are stable, discussed with team may be secondary to anti-spasticity medications  33. Blood blister to left heel: continue foam heel protectors.   34. Suboptimal vitamin D level: increase to 5,000 U daily. Continue this dose  35. Hypotension: decrease  propanolol to 10mg  TID, resolved, continue this dose   LOS: 97 days A FACE TO FACE EVALUATION WAS PERFORMED  Leandrea Ackley P Rasheka Denard 02/23/2023, 10:27 AM

## 2023-02-23 NOTE — Progress Notes (Addendum)
Occupational Therapy Weekly Progress Note  Patient Details  Name: Nicole [name] MRN: [id number] Date of Birth: [birth year]  Today's Date: 02/23/2023 OT Individual Time: 8657-8469 OT Individual Time Calculation (min): 27 min   Beginning of Progress Report:  02/16/23 End of Progress Report: 02/23/23  Short Term Goals: Week 14:  OT Short Term Goal 1 (Week 14): Patient will reach toward lower leg and foot with min assist using RUE in prep for lower body OT Short Term Goal 1 - Progress ([id number]): Other (comment) OT Short Term Goal 2 (Week 14): Patient will utilize LUE for part of one functional task with min assist OT Short Term Goal 2 - Progress (Week 14): Other (comment)  Patient has met 1 of 3 short term goals.  Improved participation  in BADL  Patient continues to demonstrate the following deficits: muscle weakness, decreased cardiorespiratoy endurance, abnormal tone, unbalanced muscle activation, decreased coordination, and decreased motor planning, field cut, decreased attention to left, decreased attention, decreased awareness, decreased problem solving, decreased safety awareness, decreased memory, and delayed processing, and decreased sitting balance, decreased standing balance, decreased postural control, hemiplegia, and decreased balance strategies and therefore will continue to benefit from skilled OT intervention to enhance overall performance with BADL.  Patient progressing toward long term goals..  Continue plan of care.  Skilled Therapeutic Interventions/Progress Updates:    Patient received supine in bed.  Brief soiled and wet.  Patient unaware, but agreeable to getting cleaned up and into clothing.  Patient turning head toward right to aide with rolling in that direction.  Patient says "thank you" when understanding what is about to happen - e.g. "turn and roll toward the window."  Patient attempting to help move RLE when donning pants, and using RUE to pull pants up in supine  position.  Transferred to chair then to sink to complete oral and hair care.  Patient assisted to flex forward at hips - to spit into sink.  Patient showing improved participation in tasks - but needs assist for initiation and termination - still perseverates motorically.   Patient left up in wheelchair with call bell in lap.    Therapy Documentation Precautions:  Precautions Precautions: Fall Precaution Comments: Contact precautions & delayed processing Required Braces or Orthoses: Other Brace Other Brace: B adjustable night splints; B elbow "cosey" splints; L palm protector; R mitt Restrictions Weight Bearing Restrictions: No   Pain: Pain Assessment Pain Scale: 0-10 Pain Score: 0-No pain    Therapy/Group: Individual Therapy  [name] 02/23/2023, 11:02 AM

## 2023-02-23 NOTE — Progress Notes (Signed)
Speech Language Pathology Weekly Progress Note  Patient Details  Name: Nicole [name] MRN: [id number] Date of Birth: 1976/03/18  Beginning of progress report period: February 16, 2023 End of progress report period: February 23, 2023   Short Term Goals: Week 9: SLP Short Term Goal 1 (Week 13): Week 13 - Pt will present w/ appropriate response to functional stimuli (i.e. accept spoon, grasp basic item appropriately, response to tactile/auditory stimuli) 60% of the time w/ modA multimodal cues SLP Short Term Goal 1 - Progress (Week 13): Met SLP Short Term Goal 2 (Week 13): Week 13 - Pt will answer open ended questions w/ modA in 70% of opportunities SLP Short Term Goal 2 - Progress (Week 13): Met SLP Short Term Goal 3 (Week 13): Week 13 - Pt will answer basic yes/no questions with modA in 70% of opportunities SLP Short Term Goal 3 - Progress (Week 13): Met SLP Short Term Goal 4 (Week 13): Week 13 - Pt will utilize increased vocal intensity w/ ModA verbal cues to remain 70% intelligibile at phrase level SLP Short Term Goal 4 - Progress (Week 13): Not met    New Short Term Goals: Week 9: SLP Short Term Goal 1 (Week 14): Week 14 - Pt will present w/ appropriate response to functional stimuli (i.e. accept spoon, grasp basic item appropriately, response to tactile/auditory stimuli) 80% of the time w/ minA multimodal cues SLP Short Term Goal 2 (Week 14): Week 14-Patient will utilize external aids for orientation to place and time with ModA multimodal cues SLP Short Term Goal 3 (Week 14): Week 14 - Pt will demonstrate sustained attention to basic tasks for 2 minutes with Mod A multimodal cues for redirection. SLP Short Term Goal 4 (Week 14): Week 14 - Pt will utilize increased vocal intensity w/ ModA verbal cues to remain 70% intelligibile at phrase level  Weekly Progress Updates: Patient continues to make gains and has met 3 of 4 STGs this reporting period. Currently, patient demonstrates improved verbal  initiation and verbally responds in 90-100% of opportunities with extra time and intermittent cues. However, patient continue to utilize a low vocal intensity and increased speech rate which reduces her overall speech intelligibility to ~60% at the phrase level. Patient demonstrates language of confusion and can require up to Max A multimodal cues for orientation to place and time with use of external aids. Patient also demonstrates fluctuating level of attention and can be impacted by perseverative behaviors like rubbing her nose, scratching her scalp, etc. Patient and family education ongoing. Patient would benefit from continued skilled SLP intervention to maximize her cognitive functioning and functional communication in order to reduce caregiver burden.    Intensity: Minumum of 1-2 x/day, 30 to 90 minutes Frequency: 1 to 3 out of 7 days (QD) Duration/Length of Stay: TBD due to SNF placement Treatment/Interventions: Cognitive remediation/compensation;Internal/external aids;Speech/Language facilitation;Cueing hierarchy;Environmental controls;Therapeutic Activities;Functional tasks;Multimodal communication approach;Patient/family education;Therapeutic Exercise    Sherika Kubicki 02/23/2023, 6:44 AM

## 2023-02-23 NOTE — Progress Notes (Signed)
Physical Therapy Session Note  Patient Details  Name: LATIVIA VELIE MRN: [id number] Date of Birth: Feb 02, 1976  Today's Date: 02/23/2023 PT Individual Time: 1130-1158 PT Individual Time Calculation (min): 28 min   Short Term Goals: Week 1:  PT Short Term Goal 1 (Week 1): Week 13; pt will complete bed mobility with totalA (pt <25%) PT Short Term Goal 1 - Progress (Week 1): Not met PT Short Term Goal 2 (Week 1): Week 13: pt will maintain unsupported sitting balance for up to 1 minute consistently with CGA PT Short Term Goal 2 - Progress (Week 1): Not met PT Short Term Goal 3 (Week 1): Week 13: Pt will complete bed<>chair transfers with totalA (pt <25%) PT Short Term Goal 3 - Progress (Week 1): Not met  Skilled Therapeutic Interventions/Progress Updates:      Pt sitting in TIS w/c to start - says "what's up" clearly. No reports of pain.  Transported to day room rehab gym. +2 dependent lift transfer from TIS wheelchair to tilt table. Assist needed for positioning to promote weight bearing and achieve hip/knee extension bilaterally. She continues to have pain/discomfort with passive movement of her L leg that improves with distraction and rest.   Pt tolerating up to ~70deg of tilt on the table with PT monitoring her feet to try and achieve heel flat. Tone in her RLE resulting in increased pressure on 5th met head and difficulty getting her foot flat. Worked on cervical PROM to promote extension, rotation, and lateral flexion - still keeps her head in forward flexed posture with chin tuck to sternum, but this is gradually improving.   +2 assist for returning to TIS w/c and then returned to her room - left sitting up in TIS wheelchair, reclined with full lap tray. All needs met.   Therapy Documentation Precautions:  Precautions Precautions: Fall Precaution Comments: Contact precautions & delayed processing Required Braces or Orthoses: Other Brace Other Brace: B adjustable night splints; B  elbow "cosey" splints; L palm protector; R mitt Restrictions Weight Bearing Restrictions: No General:       Therapy/Group: Individual Therapy  Orrin Brigham 02/23/2023, 7:51 AM

## 2023-02-23 NOTE — Progress Notes (Signed)
Speech Language Pathology Daily Session Note  Patient Details  Name: NANAKO STOPHER MRN: [id number] Date of Birth: 09/21/1975  Today's Date: 02/23/2023 SLP Individual Time: 4132-4401 SLP Individual Time Calculation (min): 38 min  Short Term Goals: Week 9: SLP Short Term Goal 1 (Week 9): Week 14 - Pt will present w/ appropriate response to functional stimuli (i.e. accept spoon, grasp basic item appropriately, response to tactile/auditory stimuli) 80% of the time w/ minA multimodal cues SLP Short Term Goal 2 (Week 9): Week 14-Patient will utilize external aids for orientation to place and time with ModA multimodal cues SLP Short Term Goal 3 (Week 9): Week 14 - Pt will demonstrate sustained attention to basic tasks for 2 minutes with Mod A multimodal cues for redirection. SLP Short Term Goal 4 (Week 9): Week 14 - Pt will utilize increased vocal intensity w/ ModA verbal cues to remain 70% intelligibile at phrase level  Skilled Therapeutic Interventions: SLP conducted skilled therapy session targeting cognitive retraining and communication goals. Patient received upright in TIS chair and moved to speech therapy office. SLP targeted sustained attention and increased vocal intensity, sustained attention, and appropriate response to functional stimuli. During phone-based task, patient assisted SLP in making decisions by choosing from field of presented choices varying in number. Patient benefited from max cues to select choices and indicate preferences. She sustained attention to task for 15 minutes given maxA, though would attend to task for 2 minutes at a time with only modA. Throughout session, benefited from mod-maxA verbal cues to increase vocal intensity at the phrase level to achieve 70% intelligibility. Towards end of session, patient drifted off the sleep and was minimally rousable. SLP returned patient to room and patient was left in chair with call bell in reach. SLP will continue with plan of care.       Pain Pain Assessment Pain Scale: 0-10 Pain Score: 0-No pain  Therapy/Group: Individual Therapy  [name], M.A., CCC-SLP  [name] 02/23/2023, 3:57 PM

## 2023-02-24 NOTE — Progress Notes (Signed)
PROGRESS NOTE   Subjective/Complaints: No events overnight. Vitals stable Last BM 10/10  ROS: Limited due to cognitive/behavioral   Objective:   No results found. No results for input(s): "WBC", "HGB", "HCT", "PLT" in the last 72 hours.     Recent Labs    02/23/23 1037  CREATININE 0.62             Intake/Output Summary (Last 24 hours) at 02/24/2023 1029 Last data filed at 02/24/2023 0800 Gross per 24 hour  Intake 940 ml  Output --  Net 940 ml       Pressure Injury 10/31/22 Buttocks Left Unstageable - Full thickness tissue loss in which the base of the injury is covered by slough (yellow, tan, gray, green or brown) and/or eschar (tan, brown or black) in the wound bed. (Active)  10/31/22 2000  Location: Buttocks  Location Orientation: Left  Staging: Unstageable - Full thickness tissue loss in which the base of the injury is covered by slough (yellow, tan, gray, green or brown) and/or eschar (tan, brown or black) in the wound bed.  Wound Description (Comments):   Present on Admission: Yes     Pressure Injury 11/18/22 Toe (Comment  which one) Anterior;Right Deep Tissue Pressure Injury - Purple or maroon localized area of discolored intact skin or blood-filled blister due to damage of underlying soft tissue from pressure and/or shear. small are (Active)  11/18/22 1300  Location: Toe (Comment  which one)  Location Orientation: Anterior;Right  Staging: Deep Tissue Pressure Injury - Purple or maroon localized area of discolored intact skin or blood-filled blister due to damage of underlying soft tissue from pressure and/or shear.  Wound Description (Comments): small area on right great toe metatarsal  Present on Admission: Yes     Pressure Injury 02/09/23 Heel Left Deep Tissue Pressure Injury - Purple or maroon localized area of discolored intact skin or blood-filled blister due to damage of underlying soft  tissue from pressure and/or shear. Black/purple and boggy,  size of a quar (Active)  02/09/23 0543  Location: Heel  Location Orientation: Left  Staging: Deep Tissue Pressure Injury - Purple or maroon localized area of discolored intact skin or blood-filled blister due to damage of underlying soft tissue from pressure and/or shear.  Wound Description (Comments): Black/purple and boggy,  size of a quarter.  Foam dressing applied  Present on Admission: No    Physical Exam: Vital Signs Blood pressure 116/83, pulse 78, temperature 98.1 F (36.7 C), resp. rate 17, height 5\' 1"  (1.549 m), weight 46.1 kg, SpO2 97%. Gen: no distress, normal appearing HEENT: oral mucosa pink and moist, NCAT Cardio: Reg rate Chest: normal effort, normal rate of breathing Abd: soft, non-distended Ext: no edema Psych: flat  Skin: + Scaling dried skin on head present-2 small excoriations, sore on the base of the 5th metatarsal head, rash on face . stage 2 to right toe, area of redness between left thumb and index finger-covered in dressing, left buttock unstageable area not viewed +redness to bilateral elbows  Neurologic: minimal engagement. Makes eye contact Tone:  MAS 1 right elbow,MAS 1 in RIght finger and wrist flexors  MAS 3 knee flexors, and MAS 3  L>R  hip adductors  MAS 3 L elbow , MAS 3-4 in L wrist and finger flexors  + LUE WHO---  Neurological exam stable today 02/24/2023   Motor 0/5 strength in LUE and BLE, Musculoskeletal: reduced bilateral shoulder and elbow ROM due to increased tone , pain with passive knee extension bilaterally   .   Assessment/Plan: 1. Functional deficits which require 3+ hours per day of interdisciplinary therapy in a comprehensive inpatient rehab setting. Physiatrist is providing close team supervision and 24 hour management of active medical problems listed below. Physiatrist and rehab team continue to assess barriers to discharge/monitor patient progress toward functional  and medical goals  Care Tool:  Bathing    Body parts bathed by patient: Face, Abdomen, Chest, Left upper leg, Right upper leg   Body parts bathed by helper: Right arm, Left arm, Front perineal area, Buttocks, Left lower leg, Right lower leg Body parts n/a: Right arm, Left arm, Front perineal area, Buttocks, Right upper leg, Left upper leg, Right lower leg, Left lower leg   Bathing assist Assist Level: Maximal Assistance - Patient 24 - 49%     Upper Body Dressing/Undressing Upper body dressing   What is the patient wearing?: Pull over shirt    Upper body assist Assist Level: Moderate Assistance - Patient 50 - 74%    Lower Body Dressing/Undressing Lower body dressing      What is the patient wearing?: Pants, Incontinence brief     Lower body assist Assist for lower body dressing: Total Assistance - Patient < 25%     Toileting Toileting Toileting Activity did not occur Press photographer and hygiene only): N/A (no void or bm)  Toileting assist Assist for toileting: Dependent - Patient 0%     Transfers Chair/bed transfer  Transfers assist  Chair/bed transfer activity did not occur: Safety/medical concerns  Chair/bed transfer assist level: Total Assistance - Patient < 25%     Locomotion Ambulation   Ambulation assist   Ambulation activity did not occur: Safety/medical concerns  Assist level: 2 helpers   Max distance: 59ft   Walk 10 feet activity   Assist  Walk 10 feet activity did not occur: Safety/medical concerns  Assist level: 2 helpers     Walk 50 feet activity   Assist Walk 50 feet with 2 turns activity did not occur: Safety/medical concerns         Walk 150 feet activity   Assist Walk 150 feet activity did not occur: Safety/medical concerns         Walk 10 feet on uneven surface  activity   Assist Walk 10 feet on uneven surfaces activity did not occur: Safety/medical concerns         Wheelchair     Assist Is the  patient using a wheelchair?: Yes Type of Wheelchair: Manual    Wheelchair assist level: Dependent - Patient 0% Max wheelchair distance: 1ft    Wheelchair 50 feet with 2 turns activity    Assist    Wheelchair 50 feet with 2 turns activity did not occur: Safety/medical concerns   Assist Level: Dependent - Patient 0%   Wheelchair 150 feet activity     Assist  Wheelchair 150 feet activity did not occur: Safety/medical concerns   Assist Level: Dependent - Patient 0%   Blood pressure 116/83, pulse 78, temperature 98.1 F (36.7 C), resp. rate 17, height 5\' 1"  (1.549 m), weight 46.1 kg, SpO2 97%.  Medical Problem List and Plan: 1. 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 Code status  DNR Continue CIR therapies including PT, OT SLP  2.  Impaired mobility: continue Lovenox, weekly creatinine ordered             -antiplatelet therapy: N/A  3. Knee paint: Tylenol as needed, voltaren gel prn\\Prescribing Home Zynex NexWave Stimulator Device and supplies as needed. IFC, NMES and TENS medically necessary Treatment Rx: Daily @ 30-40 minutes per treatment PRN. Zynex NexWave only, no substitutions. Treatment Goals: 1) To reduce and/or eliminate pain 2) To improve functional capacity and Activities of daily living 3) To reduce or prevent the need for oral medications 4) To improve circulation in the injured region 5) To decrease or prevent muscle spasm and muscle atrophy 6) To provide a self-management tool  to the patient The patient has not sufficiently improved with conservative care. Numerous studies indexed by Medline and PubMed.gov have shown Neuromuscular, Interferential, and TENS stimulators to reduce pain, improve function, and reduce medication use in injured patients. Continued use of this evidence based, safe, drug free treatment is both reasonable and medically necessary at this time.   Scheduled voltaren gel  XR reviewed and is stable, discussed with therapy  4. History of anxiety: discussed with her mom that she feels this was a big risk factor for her accident  5. Neuropsych/cognition: This patient is not capable of making decisions on her own behalf. 6. Left buttock unstageable ulcer.  continue Medihoney to buttock wound daily cover with foam dressing.  Changing dressing every 3 days or as needed soiling   7. Fluids/Electrolytes/Nutrition: Routine in and outs with follow-up chemistries  -Eating 100% most meals with assist and encouragement  8.  Cavitary right lower lobe pneumonia likely aspiration pneumonia/MRSA pneumonia.  Resolved    Latest Ref Rng & Units 02/05/2023   11:12 AM 01/22/2023    6:36 AM 12/20/2022   10:54 AM  CBC  WBC 4.0 - 10.5 K/uL 8.5  8.1  5.6   Hemoglobin 12.0 - 15.0 g/dL 47.8  29.5  62.1   Hematocrit 36.0 - 46.0 % 46.7  47.5  42.8   Platelets 150 - 400 K/uL 284  274  253      9.  History of drug abuse.  Positive cocaine on urine drug screen.  Provide counseling  10.  AKI/hypovolemia and ATN.  resolved    Latest Ref Rng & Units 02/23/2023   10:37 AM 02/16/2023    7:49 AM 02/09/2023    5:57 AM  BMP  Creatinine 0.44 - 1.00 mg/dL 3.08  6.57  8.46     11.  Mild transaminitis with rhabdomyolysis.  Both resolved  12.  Hypotension. Resolved, d/c midodrine  BP reviewed and is stable.  13. Impaired initiation: continue amantadine 100mg  BID  14. Hyperlycemia: CBGs mildly elevated, d/ced checks  15. Spasticity:   -discussed serial casting with OT and  her mom  - continue baclofen to 20mg  TID and tizanidine to 4mg  TID. Continue wrist,  elbow brace, and PRAFOs. Ordered a night splint for LLE  Repeat CMP today given chronic tizanidine use  -9/7 continues to have significant tone despite use of tizanidine and baclofen, may be beneficial consider baclofen pump in outpatient setting if this causes significant difficulties in care  9/8 consider decrease baclofen if dyskinetic movements continue  10/5-6  continue orthotics, ROM, positioning  when possible 16. Bowel and bladder incontinence: continue bowel and bladder program   17. Left hand skin maceration: Eucerin ordered, discussed hand split with OT, conitnue  18. History of magnesium deficiency: magnesium 500mg  started HS    02/24/2023    3:10 AM 02/23/2023    8:32 PM 02/23/2023    8:20 PM  Vitals with BMI  Systolic 116 133 161  Diastolic 83 84 84  Pulse 78 84 91    19. HTN: see above Increase tizanidine to 6mg  TID. Magnesium supplement added HS. Increase propanolol to 20mg  TID, continue this dose      02/24/2023    3:10 AM 02/23/2023    8:32 PM 02/23/2023    8:20 PM  Vitals with BMI  Systolic 116 133 096  Diastolic 83 84 84  Pulse 78 84 91     20. Area of redness between left thumb and index finger: continue foam dressing  21. Scalp eczema: selsun blue ordered, continue  -9/7 patient's mother feels like this is causing her to itch, will start some hydrocortisone cream  22. Fatigue: improved, discussed with team may be secondary to anti-spasticity medications, B12 reviewed and is 335, in suboptimal range, will give 1,000U B12 injection 8/7, will order monthly as needed, metanx started, increase to BID, continue  23. Right wrist swelling: XR ordered and shows no acute fracture, shows 2.31mm ulnar variance, brace removed to given patient a break from it. Voltaren gel ordered prn for discomfort, continue, CT ordered and show no acute osseus injury, continue ace wrap  23.  Dysphagia: continue D3/thins, d/c ensure as blood pressure severely elevates whenever restarted  24. MRSA nares positive: negative on repeat, precautions d/ced  25. Tachycardic: resolved, increase propanolol to BID.  continueTizanidine increased to 6mg  TID. Resolved, amantadine increased back to 100mg  BID, continue this dose  26.  Dyskinetic movements in head and neck - appears to be myoclonic jerks related to hypoxic brain injury - as discussed with pt mom, no specific meds for this on antispasticity meds , consioder benzo although with pt's SUD would avoid this class Appear to have resolved, EEG reviewed and was negative for seizure.   27. Small sore on the base of 5th metatarsal head: local wound care, continue  28. Rash on face: resolved, discussed with mother could have been secondary to added sugars in nutritional supplements  29. Sacral pressure injury: air mattress ordered, Placed nursing order to offload q1 hour while awake, continue medihoney, wound care consulted, discussed with nursing that this is improving  30. Cervical extensor weakness: tried decreasing tizanidine but spasticity appears increased and BP elevated so will increase back to 6mg  TID, continue this dose  31. Low protein stores: prosource ordered, continue  32. Intermittent lethargy: labs reviewed and are stable, discussed with team may be secondary to anti-spasticity medications  33. Blood blister to left heel: continue foam heel protectors.   34. Suboptimal vitamin D level: increase to 5,000 U daily. Continue this dose  35. Hypotension: decrease propanolol to 10mg  TID, resolved, continue this dose   LOS: 98 days A FACE TO FACE EVALUATION WAS PERFORMED  Nicole [name] 02/24/2023, 10:29 AM

## 2023-02-25 DIAGNOSIS — S90822A Blister (nonthermal), left foot, initial encounter: Secondary | ICD-10-CM

## 2023-02-25 NOTE — Plan of Care (Signed)
  Problem: Consults Goal: RH STROKE PATIENT EDUCATION Description: See Patient Education module for education specifics  Outcome: Progressing   Problem: RH BOWEL ELIMINATION Goal: RH STG MANAGE BOWEL WITH ASSISTANCE Description: STG Manage Bowel with mod I Assistance. Outcome: Progressing Goal: RH STG MANAGE BOWEL W/MEDICATION W/ASSISTANCE Description: STG Manage Bowel with Medication with mod I Assistance. Outcome: Progressing   Problem: RH BLADDER ELIMINATION Goal: RH STG MANAGE BLADDER WITH ASSISTANCE Description: STG Manage Bladder With toileting Assistance Outcome: Progressing   Problem: RH SKIN INTEGRITY Goal: RH STG MAINTAIN SKIN INTEGRITY WITH ASSISTANCE Description: STG Maintain Skin Integrity With min  Assistance. Outcome: Progressing
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
## 2023-02-26 NOTE — Progress Notes (Signed)
Occupational Therapy Session Note  Patient Details  Name: Nicole [name] MRN: [id number] Date of Birth: Apr 18, 1976  Today's Date: 02/26/2023 OT Individual Time: 0900-0930 OT Individual Time Calculation (min): 30 min    Short Term Goals: Week 14:  OT Short Term Goal 1 (Week 14): Patient will reach toward lower leg and foot with min assist using RUE in prep for lower body  OT Short Term Goal 2 (Week 14): Patient will utilize LUE for part of one functional task with min assist  Skilled Therapeutic Interventions/Progress Updates:   Patient received supine in bed - awake and alert.  Patient agreeable to get dressed.  Today she raised her right leg to put foot in pants, then used Right hand and half bridging to pull her pants up over her hips!  Patient using right hand to stretch left fingers.  States - "Feels like a doll's hand"  Patient does not seem to have hypersensitivity to contact with left hand anymore, but hand is stiff and movement limited into full flexion or extension.  Patient rolled herself onto her side in preparation to sit at edge of bed, but then had difficulty releasing bedrail with right hand.  Patient reports discomfort (general in sitting position and has strong backward lean.  Patient transferred with max assist to wheelchair then to sink to complete upper body bathing and grooming.  Patient able to put both arms into sleeves of shirt - then stopped activity - needed cueing and assist to pull tshirt over head.  Left up in wheelchair with call bell in lap.    Therapy Documentation Precautions:  Precautions Precautions: Fall Precaution Comments: Contact precautions & delayed processing Required Braces or Orthoses: Other Brace Other Brace: B adjustable night splints; B elbow "cosey" splints; L palm protector; R mitt Restrictions Weight Bearing Restrictions: No   Pain: Pain Assessment Pain Scale: 0-10 Pain Score: 0-No pain        Therapy/Group: Individual  Therapy  [name] 02/26/2023, 12:12 PM

## 2023-02-26 NOTE — Progress Notes (Signed)
Physical Therapy Session Note  Patient Details  Name: Nicole [name] MRN: [id number] Date of Birth: July 11, 1975  Today's Date: 02/26/2023 PT Individual Time: 1030-1056 PT Individual Time Calculation (min): 26 min   Short Term Goals: Week 1:  PT Short Term Goal 1 (Week 1): Week 13; pt will complete bed mobility with totalA (pt <25%) PT Short Term Goal 1 - Progress (Week 1): Not met PT Short Term Goal 2 (Week 1): Week 13: pt will maintain unsupported sitting balance for up to 1 minute consistently with CGA PT Short Term Goal 2 - Progress (Week 1): Not met PT Short Term Goal 3 (Week 1): Week 13: Pt will complete bed<>chair transfers with totalA (pt <25%) PT Short Term Goal 3 - Progress (Week 1): Not met  Skilled Therapeutic Interventions/Progress Updates:      Pt reclined in TIS w/c to start - more verbal today and answering casual questions - more direct questioning she becomes more unintelligible. RN present for morning medications.  When asked, patient wants to go outside for change of scenery and environment. Transported patient off the floor to outside. While outdoors, provided patient with PROM for BLE to promote hip/knee extension, ankle DF, and hip rotator stretching via figure-4 position.   Pt returned upstairs to her room - left reclined in TIS w/c with chest strap on for safety. All needs met.    Therapy Documentation Precautions:  Precautions Precautions: Fall Precaution Comments: Contact precautions & delayed processing Required Braces or Orthoses: Other Brace Other Brace: B adjustable night splints; B elbow "cosey" splints; L palm protector; R mitt Restrictions Weight Bearing Restrictions: No General:     Therapy/Group: Individual Therapy  Orrin Brigham 02/26/2023, 7:44 AM

## 2023-02-26 NOTE — Progress Notes (Signed)
PROGRESS NOTE   Subjective/Complaints: Alert this morning Tolerated therapy well Vitals stable Hypersensitivity to left hand improved   ROS: Positives per HPI above. Denies fevers, chills, N/V, abdominal pain, SOB, chest pain, new weakness or paraesthesias.    Objective:   No results found. No results for input(s): "WBC", "HGB", "HCT", "PLT" in the last 72 hours.     No results for input(s): "NA", "K", "CL", "CO2", "GLUCOSE", "BUN", "CREATININE", "CALCIUM" in the last 72 hours.    Intake/Output Summary (Last 24 hours) at 02/26/2023 1308 Last data filed at 02/26/2023 0809 Gross per 24 hour  Intake 595 ml  Output --  Net 595 ml       Pressure Injury 10/31/22 Buttocks Left Unstageable - Full thickness tissue loss in which the base of the injury is covered by slough (yellow, tan, gray, green or brown) and/or eschar (tan, brown or black) in the wound bed. (Active)  10/31/22 2000  Location: Buttocks  Location Orientation: Left  Staging: Unstageable - Full thickness tissue loss in which the base of the injury is covered by slough (yellow, tan, gray, green or brown) and/or eschar (tan, brown or black) in the wound bed.  Wound Description (Comments):   Present on Admission: Yes     Pressure Injury 11/18/22 Toe (Comment  which one) Anterior;Right Deep Tissue Pressure Injury - Purple or maroon localized area of discolored intact skin or blood-filled blister due to damage of underlying soft tissue from pressure and/or shear. small are (Active)  11/18/22 1300  Location: Toe (Comment  which one)  Location Orientation: Anterior;Right  Staging: Deep Tissue Pressure Injury - Purple or maroon localized area of discolored intact skin or blood-filled blister due to damage of underlying soft tissue from pressure and/or shear.  Wound Description (Comments): small area on right great toe metatarsal  Present on Admission: Yes      Pressure Injury 02/09/23 Heel Left Deep Tissue Pressure Injury - Purple or maroon localized area of discolored intact skin or blood-filled blister due to damage of underlying soft tissue from pressure and/or shear. Black/purple and boggy,  size of a quar (Active)  02/09/23 0543  Location: Heel  Location Orientation: Left  Staging: Deep Tissue Pressure Injury - Purple or maroon localized area of discolored intact skin or blood-filled blister due to damage of underlying soft tissue from pressure and/or shear.  Wound Description (Comments): Black/purple and boggy,  size of a quarter.  Foam dressing applied  Present on Admission: No    Physical Exam: Vital Signs Blood pressure 120/84, pulse 91, temperature 98.2 F (36.8 C), resp. rate 19, height 5\' 1"  (1.549 m), weight 46.1 kg, SpO2 (!) 83%.  Gen: no distress, normal appearing HEENT: oral mucosa pink and moist, NCAT Cardio: Reg rate Chest: normal effort, normal rate of breathing Abd: soft, non-distended Ext: no edema Psych: pleasant, normal affect Abd: soft, non-distended, +BS Psych: flat affect, pressured speech, irritable mood Skin: + Scaling dried skin on bilateral heels  - 2 small excoriations, sore on the base of the 5th metatarsal head -no longer present - rash on face - no longer present - stage 2 to right toe -no longer present  + area  of redness between left thumb and index finger-now minimal, purple bruise left buttock unstageable - not examined +redness to bilateral elbows -not appreciated + Left heel stage III DTI, covered in foam dressing  Neurologic: AAOx2,  to year but not month or date. Follows most simple commands.  + Hypophonation, speech intelligability ~10% Twice daily for right upper extremity 1/5 finger flexion, otherwise no volitional movement of left upper extremity or bilateral lowers + BL UE WHO.   Prior exams: MAS 1 right elbow,MAS 1 in RIght finger and wrist flexors  MAS 3 knee flexors, and MAS 3 L>R   hip adductors  MAS 3 L elbow , MAS 3-4 in L wrist and finger flexors  + LUE WHO---  Neurological exam stable today 02/26/2023  Hypersensitivity to left hand improved  Motor 0/5 strength in LUE and BLE,   Musculoskeletal: reduced bilateral shoulder and elbow ROM due to increased tone , pain with passive knee extension bilaterally   .   Assessment/Plan: 1. Functional deficits which require 3+ hours per day of interdisciplinary therapy in a comprehensive inpatient rehab setting. Physiatrist is providing close team supervision and 24 hour management of active medical problems listed below. Physiatrist and rehab team continue to assess barriers to discharge/monitor patient progress toward functional and medical goals  Care Tool:  Bathing    Body parts bathed by patient: Face, Abdomen, Chest, Left upper leg, Right upper leg   Body parts bathed by helper: Right arm, Left arm, Front perineal area, Buttocks, Left lower leg, Right lower leg Body parts n/a: Right arm, Left arm, Front perineal area, Buttocks, Right upper leg, Left upper leg, Right lower leg, Left lower leg   Bathing assist Assist Level: Maximal Assistance - Patient 24 - 49%     Upper Body Dressing/Undressing Upper body dressing   What is the patient wearing?: Pull over shirt    Upper body assist Assist Level: Moderate Assistance - Patient 50 - 74%    Lower Body Dressing/Undressing Lower body dressing      What is the patient wearing?: Pants, Incontinence brief     Lower body assist Assist for lower body dressing: Total Assistance - Patient < 25%     Toileting Toileting Toileting Activity did not occur Press photographer and hygiene only): N/A (no void or bm)  Toileting assist Assist for toileting: Dependent - Patient 0%     Transfers Chair/bed transfer  Transfers assist  Chair/bed transfer activity did not occur: Safety/medical concerns  Chair/bed transfer assist level: Total Assistance - Patient < 25%      Locomotion Ambulation   Ambulation assist   Ambulation activity did not occur: Safety/medical concerns  Assist level: 2 helpers   Max distance: 72ft   Walk 10 feet activity   Assist  Walk 10 feet activity did not occur: Safety/medical concerns  Assist level: 2 helpers     Walk 50 feet activity   Assist Walk 50 feet with 2 turns activity did not occur: Safety/medical concerns         Walk 150 feet activity   Assist Walk 150 feet activity did not occur: Safety/medical concerns         Walk 10 feet on uneven surface  activity   Assist Walk 10 feet on uneven surfaces activity did not occur: Safety/medical concerns         Wheelchair     Assist Is the patient using a wheelchair?: Yes Type of Wheelchair: Manual    Wheelchair assist level: Dependent -  Patient 0% Max wheelchair distance: 16ft    Wheelchair 50 feet with 2 turns activity    Assist    Wheelchair 50 feet with 2 turns activity did not occur: Safety/medical concerns   Assist Level: Dependent - Patient 0%   Wheelchair 150 feet activity     Assist  Wheelchair 150 feet activity did not occur: Safety/medical concerns   Assist Level: Dependent - Patient 0%   Blood pressure 120/84, pulse 91, temperature 98.2 F (36.8 C), resp. rate 19, height 5\' 1"  (1.549 m), weight 46.1 kg, SpO2 (!) 83%.  Medical Problem List and Plan: 1. 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 Code status  DNR Continue  CIR therapies including PT, OT SLP  2.  Impaired mobility: continue Lovenox, weekly creatinine ordered             -antiplatelet therapy: N/A  3. Knee paint: Tylenol as needed, continue voltaren gel scheduled. Prescribing Home Zynex NexWave   XR reviewed and is stable, discussed with therapy  4. History of anxiety: discussed with her mom that she feels this was a big risk factor for her accident  5. Neuropsych/cognition: This patient is not capable of making decisions on her own behalf  - 10/12: Still not capable of medical decision making; BUT oriented and communicating, following directions - MUCH improved! . 6. Left buttock unstageable ulcer.  Continue Medihoney to buttock wound daily cover with foam dressing.  Changing dressing every 3 days or as needed soiling   7. Fluids/Electrolytes/Nutrition: Routine in and outs with follow-up chemistries  -Eating 100% most meals with assist and encouragement  8.  Cavitary right lower lobe pneumonia likely aspiration pneumonia/MRSA pneumonia.  Resolved    Latest Ref Rng & Units 02/05/2023   11:12 AM 01/22/2023    6:36 AM 12/20/2022   10:54 AM  CBC  WBC 4.0 - 10.5 K/uL 8.5  8.1  5.6   Hemoglobin 12.0 - 15.0 g/dL 04.5  40.9  81.1   Hematocrit 36.0 - 46.0 % 46.7  47.5  42.8   Platelets 150 - 400 K/uL 284  274  253      9.  History of drug abuse.  Positive cocaine on urine drug screen.  Provide counseling  10.  AKI/hypovolemia and ATN.  resolved    Latest Ref Rng & Units 02/23/2023   10:37 AM 02/16/2023    7:49 AM 02/09/2023    5:57 AM  BMP  Creatinine 0.44 - 1.00 mg/dL 9.14  7.82  9.56     11.  Mild transaminitis with rhabdomyolysis.  Both resolved  12.  Hypotension. Resolved, d/c midodrine  BP reviewed and is stable.  13. Impaired initiation: continue amantadine 100mg  BID  14. Hyperlycemia: CBGs mildly elevated, d/ced checks  15. Spasticity:   -discussed serial casting with OT and her mom  - continue baclofen to 20mg  TID and  tizanidine to 4mg  TID. Continue wrist, elbow brace, and PRAFOs. Ordered a night splint for  LLE  Repeat CMP today given chronic tizanidine use  -9/7 continues to have significant tone despite use of tizanidine and baclofen, may be beneficial consider baclofen pump in outpatient setting if this causes significant difficulties in care  9/8 consider decrease baclofen if dyskinetic movements continue  10/5-6  continue orthotics, ROM, positioning  when possible 16. Bowel and bladder incontinence: continue bowel and bladder program  17. Left hand skin maceration: Eucerin ordered, discussed hand split with OT, conitnue  18. History of magnesium deficiency: magnesium 500mg  started HS    02/26/2023    4:26 AM 02/25/2023    8:31 PM 02/25/2023   12:53 PM  Vitals with BMI  Systolic 120 115 161  Diastolic 84 78 91  Pulse 91 91 80    19. HTN: see above Increase tizanidine to 6mg  TID. Magnesium supplement added HS. Increase propanolol to 20mg  TID, continue this dose  - 10/12: Normotensive, monitor    02/26/2023    4:26 AM 02/25/2023    8:31 PM 02/25/2023   12:53 PM  Vitals with BMI  Systolic 120 115 096  Diastolic 84 78 91  Pulse 91 91 80     20. Area of redness between left thumb and index finger: continue foam dressing  21. Scalp eczema: selsun blue ordered, continue  -9/7 patient's mother feels like this is causing her to itch, will start some hydrocortisone cream  22. Fatigue: improved, discussed with team may be secondary to anti-spasticity medications, B12 reviewed and is 335, in suboptimal range, will give 1,000U B12 injection 8/7, will order monthly as needed, metanx started, increase to BID, continue  23. Right wrist swelling: XR ordered and shows no acute fracture, shows 2.56mm ulnar variance, brace removed to given patient a break from it. Voltaren gel ordered prn for discomfort, continue, CT ordered and show no acute osseus injury, continue ace wrap  23. Dysphagia: continue  D3/thins, d/c ensure as blood pressure severely elevates whenever restarted  24. MRSA nares positive: negative on repeat, precautions d/ced  25. Tachycardic: resolved, increase propanolol to BID.  continueTizanidine increased to 6mg  TID. Resolved, amantadine increased back to 100mg  BID, continue this dose  26.  Dyskinetic movements in head and neck - appears to be myoclonic jerks related to hypoxic brain injury - as discussed with pt mom, no specific meds for this on antispasticity meds , consioder benzo although with pt's SUD would avoid this class Appear to have resolved, EEG reviewed and was negative for seizure.  None on exam 10/12  27. Small sore on the base of 5th metatarsal head: local wound care, continue  28. Rash on face: resolved, discussed with mother could have been secondary to added sugars in nutritional supplements  29. Sacral pressure injury: air mattress ordered, Placed nursing order to offload q1 hour while awake, continue medihoney, wound care consulted, discussed with nursing that this is improving  30. Cervical extensor weakness: tried decreasing tizanidine but spasticity appears increased and BP elevated so will increase back to 6mg  TID, continue this dose  31. Low protein stores: prosource ordered, continue  32. Intermittent lethargy: labs reviewed and are stable, discussed with team may be secondary to anti-spasticity medications  33. Blood blister to left heel: continue foam heel protectors.  - 10/13: Ongoing, patient in lower last branches with foam barrier dressings, intermittent tolerance of air boots  34. Suboptimal vitamin D level: increase to 5,000 U daily. Continue this dose  35. Hypotension: decrease propanolol to 10mg  TID, resolved, continue this dose -  None this weekend   LOS: 100 days A FACE TO FACE EVALUATION WAS PERFORMED  Nicole [name] 02/26/2023, 1:08 PM

## 2023-02-27 MED ORDER — PROPRANOLOL HCL 20 MG PO TABS
10.0000 mg | ORAL_TABLET | Freq: Two times a day (BID) | ORAL | Status: DC
  Administered 2023-02-27 – 2023-02-28 (×2): 10 mg via ORAL
  Filled 2023-02-27 (×2): qty 1

## 2023-02-27 NOTE — Progress Notes (Signed)
Speech Language Pathology Daily Session Note  Patient Details  Name: Nicole [name] MRN: [id number] Date of Birth: November 20, 1975  Today's Date: 02/27/2023 SLP Individual Time: 1130-1200 SLP Individual Time Calculation (min): 30 min  Short Term Goals: Week 9: SLP Short Term Goal 1 (Week 9): Week 14 - Pt will present w/ appropriate response to functional stimuli (i.e. accept spoon, grasp basic item appropriately, response to tactile/auditory stimuli) 80% of the time w/ minA multimodal cues SLP Short Term Goal 2 (Week 9): Week 14-Patient will utilize external aids for orientation to place and time with ModA multimodal cues SLP Short Term Goal 3 (Week 9): Week 14 - Pt will demonstrate sustained attention to basic tasks for 2 minutes with Mod A multimodal cues for redirection. SLP Short Term Goal 4 (Week 9): Week 14 - Pt will utilize increased vocal intensity w/ ModA verbal cues to remain 70% intelligibile at phrase level  Skilled Therapeutic Interventions:  Patient was seen in am to address cognitive re- training. Pt alert upon SLP arrival and seated upright in WC. She denied pain and was agreeable for session. Given external visual aid, pt identified month, day of week, and year with min to mod A. SLP engaged pt in discussion surrounding temporal concepts. Pt verbalized current season and sports being played. She was able to verbalize her preferred sports team and player indep. She participated in conversation for 3-4 conversational exchanges. SLP engaged pt in structured attention task. Pt required max A for attention for 2 minutes. SLP also reviewed call button and examples of utilization. She recalled uses of call button for up to 5 minutes and identified one example of utilization. Pt was left seated upright in WC with call button within each and bed alarm active. SLP to continue POC.   Pain Pain Assessment Pain Scale: 0-10 Pain Score: 0-No pain  Therapy/Group: Individual Therapy  [name] 02/27/2023, 4:09 PM

## 2023-02-27 NOTE — Progress Notes (Signed)
Physical Therapy Weekly Progress Note  Patient Details  Name: Nicole [name] MRN: [id number] Date of Birth: [birth year]  Beginning of progress report period: February 16, 2023 End of progress report period: February 27, 2023  Today's Date: 02/27/2023 PT Individual Time: 2952-8413 PT Individual Time Calculation (min): 27 min   Patient has met 1 of 4 short term goals.  Limited functional progress this reporting period - continues to require total/dependent care for all functional mobility - non ambulatory to date. Improvement in alertness, volitional movement, and verbalizations.   Patient continues to demonstrate the following deficits muscle weakness and muscle joint tightness, decreased cardiorespiratoy endurance, impaired timing and sequencing, abnormal tone, unbalanced muscle activation, motor apraxia, decreased coordination, and  , decreased motor planning and ideational apraxia, decreased initiation, decreased attention, decreased awareness, decreased problem solving, decreased safety awareness, decreased memory, and delayed processing, and decreased sitting balance, decreased standing balance, decreased postural control, hemiplegia, and decreased balance strategies and therefore will continue to benefit from skilled PT intervention to increase functional independence with mobility.  Patient progressing toward long term goals..  Continue plan of care.  PT Short Term Goals Week 1:  PT Short Term Goal 1 (Week 1): Week 13; pt will complete bed mobility with totalA (pt <25%) PT Short Term Goal 1 - Progress (Week 1): Not met PT Short Term Goal 2 (Week 1): Week 13: pt will maintain unsupported sitting balance for up to 1 minute consistently with CGA PT Short Term Goal 2 - Progress (Week 1): Not met PT Short Term Goal 3 (Week 1): Week 13: Pt will complete bed<>chair transfers with totalA (pt <25%) PT Short Term Goal 3 - Progress (Week 1): Not met Week 2:  PT Short Term Goal 1 (Week 2): Week 14; pt  will complete bed mobility with totalA (pt <25%) PT Short Term Goal 1 - Progress (Week 2): Not met PT Short Term Goal 2 (Week 2): Week 14: pt will maintain unsupported sitting balance for up to 1 minute consistently with CGA PT Short Term Goal 2 - Progress (Week 2): Not met PT Short Term Goal 3 (Week 2): Week 14: Pt will complete bed<>chair transfers with totalA (pt <25%) PT Short Term Goal 3 - Progress (Week 2): Not met PT Short Term Goal 4 (Week 2): Pt will be assessed for custom wheelchair needs PT Short Term Goal 4 - Progress (Week 2): Not met  Skilled Therapeutic Interventions/Progress Updates:      Pt in bed sleeping to start - agreeable to PT tx when woken. Brief checked and it was dry. Donned tennis shoes with assist for time. 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   Supine<>sitting EOB with totalA with HOB slightly elevated. Completed squat pivot transfer with dependent assist of 1 person. Transported to main rehab gym and assisted onto mat table in similar manner. Remainder of session focused on sitting balance, righting reactions, and awareness. Pt with a significant posterior bias and delayed to absent righting. Pt somewhat resists any forward weight shifting with trunk extension. Requires maxA overall for sitting balance with very brief ability to maintain with CGA.   Returned to her room and patient left reclined in TIS w/c, chest strap in place, call bell in lap.    Therapy Documentation Precautions:  Precautions Precautions: Fall Precaution Comments: Contact precautions & delayed processing Required Braces or Orthoses: Other Brace Other Brace: B adjustable  night splints; B elbow "cosey" splints; L palm protector; R mitt Restrictions Weight Bearing Restrictions: No General:     Therapy/Group: Individual Therapy  Stanislav Gervase P Carzell Saldivar PT 02/27/2023, 8:34 AM

## 2023-02-27 NOTE — Progress Notes (Signed)
PROGRESS NOTE   Subjective/Complaints: Tolerating therapy Patient's chart reviewed- No issues reported overnight Vitals signs stable    ROS: Positives per HPI above. Denies fevers, chills, N/V, abdominal pain, SOB, chest pain, new weakness or paraesthesias.    Objective:   No results found. No results for input(s): "WBC", "HGB", "HCT", "PLT" in the last 72 hours.     No results for input(s): "NA", "K", "CL", "CO2", "GLUCOSE", "BUN", "CREATININE", "CALCIUM" in the last 72 hours.    Intake/Output Summary (Last 24 hours) at 02/27/2023 1127 Last data filed at 02/27/2023 1610 Gross per 24 hour  Intake 713 ml  Output --  Net 713 ml       Pressure Injury 10/31/22 Buttocks Left Unstageable - Full thickness tissue loss in which the base of the injury is covered by slough (yellow, tan, gray, green or brown) and/or eschar (tan, brown or black) in the wound bed. (Active)  10/31/22 2000  Location: Buttocks  Location Orientation: Left  Staging: Unstageable - Full thickness tissue loss in which the base of the injury is covered by slough (yellow, tan, gray, green or brown) and/or eschar (tan, brown or black) in the wound bed.  Wound Description (Comments):   Present on Admission: Yes     Pressure Injury 11/18/22 Toe (Comment  which one) Anterior;Right Deep Tissue Pressure Injury - Purple or maroon localized area of discolored intact skin or blood-filled blister due to damage of underlying soft tissue from pressure and/or shear. small are (Active)  11/18/22 1300  Location: Toe (Comment  which one)  Location Orientation: Anterior;Right  Staging: Deep Tissue Pressure Injury - Purple or maroon localized area of discolored intact skin or blood-filled blister due to damage of underlying soft tissue from pressure and/or shear.  Wound Description (Comments): small area on right great toe metatarsal  Present on Admission: Yes      Pressure Injury 02/09/23 Heel Left Deep Tissue Pressure Injury - Purple or maroon localized area of discolored intact skin or blood-filled blister due to damage of underlying soft tissue from pressure and/or shear. Black/purple and boggy,  size of a quar (Active)  02/09/23 0543  Location: Heel  Location Orientation: Left  Staging: Deep Tissue Pressure Injury - Purple or maroon localized area of discolored intact skin or blood-filled blister due to damage of underlying soft tissue from pressure and/or shear.  Wound Description (Comments): Black/purple and boggy,  size of a quarter.  Foam dressing applied  Present on Admission: No    Physical Exam: Vital Signs Blood pressure 122/84, pulse 72, temperature 98.1 F (36.7 C), resp. rate 16, height 5\' 1"  (1.549 m), weight 46.1 kg, SpO2 100%.  Gen: no distress, normal appearing HEENT: oral mucosa pink and moist, NCAT Cardio: Reg rate Chest: normal effort, normal rate of breathing Abd: soft, non-distended Ext: no edema Psych: pleasant, normal affect Abd: soft, non-distended, +BS Psych: flat affect, pressured speech, irritable mood Skin: + Scaling dried skin on bilateral heels  - 2 small excoriations, sore on the base of the 5th metatarsal head -no longer present - rash on face - no longer present - stage 2 to right toe -no longer present  + area of  redness between left thumb and index finger-now minimal, purple bruise left buttock unstageable - not examined +redness to bilateral elbows -not appreciated + Left heel stage III DTI, covered in foam dressing  Neurologic: AAOx2,  to year but not month or date. Follows most simple commands.  + Hypophonation, speech intelligability ~10% Twice daily for right upper extremity 1/5 finger flexion, otherwise no volitional movement of left upper extremity or bilateral lowers + BL UE WHO.   Prior exams: MAS 1 right elbow,MAS 1 in RIght finger and wrist flexors  MAS 3 knee flexors, and MAS 3 L>R  hip  adductors  MAS 3 L elbow , MAS 3-4 in L wrist and finger flexors  + LUE WHO---  Exam stable 10/15 Hypersensitivity to left hand improved  Motor 0/5 strength in LUE and BLE,   Musculoskeletal: reduced bilateral shoulder and elbow ROM due to increased tone , pain with passive knee extension bilaterally   .   Assessment/Plan: 1. Functional deficits which require 3+ hours per day of interdisciplinary therapy in a comprehensive inpatient rehab setting. Physiatrist is providing close team supervision and 24 hour management of active medical problems listed below. Physiatrist and rehab team continue to assess barriers to discharge/monitor patient progress toward functional and medical goals  Care Tool:  Bathing    Body parts bathed by patient: Face, Abdomen, Chest, Left upper leg, Right upper leg   Body parts bathed by helper: Right arm, Left arm, Front perineal area, Buttocks, Left lower leg, Right lower leg Body parts n/a: Right arm, Left arm, Front perineal area, Buttocks, Right upper leg, Left upper leg, Right lower leg, Left lower leg   Bathing assist Assist Level: Maximal Assistance - Patient 24 - 49%     Upper Body Dressing/Undressing Upper body dressing   What is the patient wearing?: Pull over shirt    Upper body assist Assist Level: Moderate Assistance - Patient 50 - 74%    Lower Body Dressing/Undressing Lower body dressing      What is the patient wearing?: Pants, Incontinence brief     Lower body assist Assist for lower body dressing: Total Assistance - Patient < 25%     Toileting Toileting Toileting Activity did not occur Press photographer and hygiene only): N/A (no void or bm)  Toileting assist Assist for toileting: Dependent - Patient 0%     Transfers Chair/bed transfer  Transfers assist  Chair/bed transfer activity did not occur: Safety/medical concerns  Chair/bed transfer assist level: Total Assistance - Patient < 25%      Locomotion Ambulation   Ambulation assist   Ambulation activity did not occur: Safety/medical concerns  Assist level: 2 helpers   Max distance: 4ft   Walk 10 feet activity   Assist  Walk 10 feet activity did not occur: Safety/medical concerns  Assist level: 2 helpers     Walk 50 feet activity   Assist Walk 50 feet with 2 turns activity did not occur: Safety/medical concerns         Walk 150 feet activity   Assist Walk 150 feet activity did not occur: Safety/medical concerns         Walk 10 feet on uneven surface  activity   Assist Walk 10 feet on uneven surfaces activity did not occur: Safety/medical concerns         Wheelchair     Assist Is the patient using a wheelchair?: Yes Type of Wheelchair: Manual    Wheelchair assist level: Dependent - Patient 0% Max  wheelchair distance: 79ft    Wheelchair 50 feet with 2 turns activity    Assist    Wheelchair 50 feet with 2 turns activity did not occur: Safety/medical concerns   Assist Level: Dependent - Patient 0%   Wheelchair 150 feet activity     Assist  Wheelchair 150 feet activity did not occur: Safety/medical concerns   Assist Level: Dependent - Patient 0%   Blood pressure 122/84, pulse 72, temperature 98.1 F (36.7 C), resp. rate 16, height 5\' 1"  (1.549 m), weight 46.1 kg, SpO2 100%.  Medical Problem List and Plan: 1. 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 Code status  DNR Continue CIR  therapies including PT, OT SLP  2.  Impaired mobility: continue Lovenox, weekly creatinine ordered             -antiplatelet therapy: N/A  3. Knee paint: Tylenol as needed, continue voltaren gel scheduled. Prescribing Home Zynex NexWave   XR reviewed and is stable, discussed with therapy  4. History of anxiety: discussed with her mom that she feels this was a big risk factor for her accident  5. Neuropsych/cognition: This patient is not capable of making decisions on her own behalf  - 10/12: Still not capable of medical decision making; BUT oriented and communicating, following directions - MUCH improved! . 6. Left buttock unstageable ulcer.  Continue Medihoney to buttock wound daily cover with foam dressing.  Changing dressing every 3 days or as needed soiling   7. Fluids/Electrolytes/Nutrition: Routine in and outs with follow-up chemistries  -Eating 100% most meals with assist and encouragement  8.  Cavitary right lower lobe pneumonia likely aspiration pneumonia/MRSA pneumonia.  Resolved    Latest Ref Rng & Units 02/05/2023   11:12 AM 01/22/2023    6:36 AM 12/20/2022   10:54 AM  CBC  WBC 4.0 - 10.5 K/uL 8.5  8.1  5.6   Hemoglobin 12.0 - 15.0 g/dL 16.1  09.6  04.5   Hematocrit 36.0 - 46.0 % 46.7  47.5  42.8   Platelets 150 - 400 K/uL 284  274  253      9.  History of drug abuse.  Positive cocaine on urine drug screen.  Provide counseling  10.  AKI/hypovolemia and ATN.  resolved    Latest Ref Rng & Units 02/23/2023   10:37 AM 02/16/2023    7:49 AM 02/09/2023    5:57 AM  BMP  Creatinine 0.44 - 1.00 mg/dL 4.09  8.11  9.14     11.  Mild transaminitis with rhabdomyolysis.  Both resolved  12.  Hypotension. Resolved, d/c midodrine  BP reviewed and is stable.  13. Impaired initiation: continue amantadine 100mg  BID  14. Hyperlycemia: CBGs mildly elevated, d/ced checks  15. Spasticity:   -discussed serial casting with OT and her mom  - continue baclofen to 20mg  TID and  tizanidine to 4mg  TID. Continue wrist, elbow brace, and PRAFOs. Ordered a night splint for LLE  Repeat CMP  today given chronic tizanidine use  -9/7 continues to have significant tone despite use of tizanidine and baclofen, may be beneficial consider baclofen pump in outpatient setting if this causes significant difficulties in care  9/8 consider decrease baclofen if dyskinetic movements continue  10/5-6  continue orthotics, ROM, positioning  when possible 16. Bowel and bladder incontinence: continue bowel and bladder program  17. Left hand skin maceration: Eucerin ordered, discussed hand split with OT, conitnue  18. History of magnesium deficiency: magnesium 500mg  started HS    02/27/2023    3:48 AM 02/26/2023    7:24 PM 02/26/2023    1:17 PM  Vitals with BMI  Systolic 122 102 161  Diastolic 84 70 71  Pulse 72 83 77    19. HTN: see above Increase tizanidine to 6mg  TID. Magnesium supplement added HS. Increase propanolol to 20mg  TID, continue this dose  - 10/12: Normotensive, monitor    02/27/2023    3:48 AM 02/26/2023    7:24 PM 02/26/2023    1:17 PM  Vitals with BMI  Systolic 122 102 096  Diastolic 84 70 71  Pulse 72 83 77     20. Area of redness between left thumb and index finger: continue foam dressing  21. Scalp eczema: selsun blue ordered, continue  -9/7 patient's mother feels like this is causing her to itch, will start some hydrocortisone cream  22. Fatigue: improved, discussed with team may be secondary to anti-spasticity medications, B12 reviewed and is 335, in suboptimal range, will give 1,000U B12 injection 8/7, will order monthly as needed, metanx started, increase to BID, continue  23. Right wrist swelling: XR ordered and shows no acute fracture, shows 2.57mm ulnar variance, brace removed to given patient a break from it. Voltaren gel ordered prn for discomfort, continue, CT ordered and show no acute osseus injury, continue ace wrap  23. Dysphagia: continue  D3/thins, d/c ensure as blood pressure severely elevates whenever restarted  24. MRSA nares positive: negative on repeat, precautions d/ced  25. Tachycardic: resolved, increase propanolol to BID.  continueTizanidine increased to 6mg  TID. Resolved, amantadine increased back to 100mg  BID, continue this dose  26.  Dyskinetic movements in head and neck - appears to be myoclonic jerks related to hypoxic brain injury - as discussed with pt mom, no specific meds for this on antispasticity meds , consioder benzo although with pt's SUD would avoid this class Appear to have resolved, EEG reviewed and was negative for seizure.  None on exam 10/12  27. Small sore on the base of 5th metatarsal head: local wound care, continue  28. Rash on face: resolved, discussed with mother could have been secondary to added sugars in nutritional supplements  29. Sacral pressure injury: air mattress ordered, Placed nursing order to offload q1 hour while awake, continue medihoney, wound care consulted, discussed with nursing that this is improving  30. Cervical extensor weakness: tried decreasing tizanidine but spasticity appears increased and BP elevated so will increase back to 6mg  TID, continue this dose  31. Low protein stores: prosource ordered, continue  32. Intermittent lethargy: labs reviewed and are stable, discussed with team may be secondary to anti-spasticity medications  33. Blood blister to left heel: continue foam heel protectors.  34. Suboptimal vitamin D level: increase to 5,000 U daily. Continue this dose  35. Hypotension: decrease propanolol to 10mg  BID, resolved   LOS: 101 days A FACE TO FACE EVALUATION WAS PERFORMED  Nicole [name] P Nicole [name] 02/27/2023, 11:27 AM

## 2023-02-27 NOTE — Progress Notes (Signed)
Occupational Therapy Session Note  Patient Details  Name: Nicole [name] MRN: [id number] Date of Birth: 09-03-1975  Today's Date: 02/27/2023 OT Individual Time: 1418-1500 OT Individual Time Calculation (min): 42 min    Short Term Goals: Week 9: OT Short Term Goal 1 (Week 9): Patient will change orientation of toothbrush/ hairbrush to accomodate task with min assist after set up OT Short Term Goal 2 (Week 9): Patient will spear food x 3 once utensil in hand, then bring to mouth with only contact assistance  Skilled Therapeutic Interventions/Progress Updates:    Pt greeted seated in TIS wc and agreeable to OT treatment session. Pt brought to the sink for BADL tasks. Pt perseverative on wringing out wash cloths with R hand and difficult to redirect. OT attempted to provide hand over hand A to get pt to bring wash cloth to face, but she yelled at OT that she needed more time. OT gave pt 5 more minutes with mderate cues, but pt unable to terminate perseveration of squeezing wash cloth without physical assist. Worked on initiation to don shirt, but still needed max A. Pt transferred back to bed with total A +2. Pt incontinent of BM and bladder. Total A for peri-care, brief change, and donning clean pants. Max+2 for rolling in bed. Pt left semi-reclined in bed with bed alarm on, call bell in reach, and needs met.   Therapy Documentation Precautions:  Precautions Precautions: Fall Precaution Comments: Contact precautions & delayed processing Required Braces or Orthoses: Other Brace Other Brace: B adjustable night splints; B elbow "cosey" splints; L palm protector; R mitt Restrictions Weight Bearing Restrictions: No Pain: Pt winces in pain with movement, no number given. Rest and repositioned.   Therapy/Group: Individual Therapy  Mal Amabile 02/27/2023, 2:38 PM

## 2023-02-28 MED ORDER — TRAMADOL HCL 50 MG PO TABS
50.0000 mg | ORAL_TABLET | Freq: Four times a day (QID) | ORAL | Status: DC | PRN
  Administered 2023-03-01 – 2023-04-30 (×59): 50 mg via ORAL
  Filled 2023-02-28 (×64): qty 1

## 2023-02-28 MED ORDER — PROPRANOLOL HCL 20 MG PO TABS
10.0000 mg | ORAL_TABLET | Freq: Every day | ORAL | Status: DC
  Administered 2023-03-01: 10 mg via ORAL
  Filled 2023-02-28: qty 1

## 2023-02-28 NOTE — Patient Care Conference (Signed)
Inpatient RehabilitationTeam Conference and Plan of Care Update Date: 02/28/2023   Time: 11:06 AM    Patient Name: Nicole [name]      Medical Record Number: [id number]  Date of Birth: Sep 30, 1975 Sex: Female         Room/Bed: 4W09C/4W09C-01 Payor Info: Payor: [employer] / Plan: [employer] / Product Type: *No Product type* /    Admit Date/Time:  11/18/2022  3:15 PM  Primary Diagnosis:  Hypoxic brain injury [facility])  Hospital Problems: Principal Problem:   Hypoxic brain injury (HCC) Active Problems:   Protein-calorie malnutrition, severe   Essential hypertension   Pressure injury of skin    Expected Discharge Date: Expected Discharge Date:  (SNF pending)  Team Members Present: Physician leading conference: Dr. Sula Soda Social Worker Present: Dossie Der, LCSW Nurse Present: Chana Bode, RN PT Present: Wynelle Link, PT OT Present: Limmie Patricia, OT SLP Present: [name], SLP PPS Coordinator present : Fae Pippin, SLP     Current Status/Progress Goal Weekly Team Focus  Bowel/Bladder   Pt incotinent  B/B  LBM 02/27/23   Pt will regain continence by time toileting   Assist pt with toileting needs q3-4hrs on qshift    Swallow/Nutrition/ Hydration               ADL's   Total to max A BADL, continued improvement with hand to mouth - use utensil/cup, oral care   max assist   postural control, pain management, activity participation    Mobility   no updates - total to dependent care for functional mobility.   TotA overall  Bed positioning, posture, stretching, sitting balance    Communication   verbal response  in 70- 75% of opportunites with decreased vocal intensity warranting mod to max A for elevation   mod A   steady progress toward improving speech intelligibility    Safety/Cognition/ Behavioral Observations  Max A   Mod-Max A   sustained attention, orientation, basic prob solving and initiation    Pain   Denies  pain at this time. PRN Tylenol to managed pain . Increased Dyskinetic movement associated with foot pain Pt will be free from pain   Assess pt for pain qshift/prn and adminiter pain meds per order with follow up for effectiveness  Added Tramadol for pain  Skin   Left heel DTI, foam in place. Blister to right foot with foam in place. Erythema/redness to sacrum with foam dressing place.   Pt will regain skin intergrity with no further breakdown  Assess skin qshift/prn and promote healing      Discharge Planning:  Continue to [facility] long term care medicaid approval. Mom calling along with this worker   Team Discussion: Patient post hypoxic brain injury with increased dyskinetic movement associated with foot pain; MD adjusted propanolol and added Tramadol for pain control.  Patient on target to meet rehab goals: Patient with volitional movement in right lower extremity but remains dependent  for care. Needs max - total for ADLs and mobility.  *See Care Plan and progress notes for long and short-term goals.   Revisions to Treatment Plan:  N/a   Teaching Needs: Safety, medications, skin care, transfers, toileting, etc.   Current Barriers to Discharge: Home enviroment access/layout, Incontinence, Wound care, Lack of/limited family support, and Insurance for SNF coverage  Possible Resolutions to Barriers: SNF pending long term care medicaid approval     Medical Summary Current Status: blood pressure stabilized, pain in knee and foot,  vitals stable, dyskinetic movements    Barriers to Discharge Comments: HTN/spasticity, pain in knee and foot, dyskinetic movements Possible Resolutions to [employer] Focus: continue baclofen/tizanidine, tramadol added, continue to monitor   Continued Need for Acute Rehabilitation Level of Care: The patient requires daily medical management by a physician with specialized training in physical medicine and rehabilitation for the following  reasons: Direction of a multidisciplinary physical rehabilitation program to maximize functional independence : Yes Medical management of patient stability for increased activity during participation in an intensive rehabilitation regime.: Yes Analysis of laboratory values and/or radiology reports with any subsequent need for medication adjustment and/or medical intervention. : Yes   I attest that I was present, lead the team conference, and concur with the assessment and plan of the team.   Chana Bode B 02/28/2023, 1:58 PM

## 2023-02-28 NOTE — Progress Notes (Signed)
Occupational Therapy Session Note  Patient Details  Name: Nicole [name] MRN: [id number] Date of Birth: 05-17-1975  Today's Date: 02/28/2023 OT Individual Time: 4098-1191 OT Individual Time Calculation (min): 36 min    Short Term Goals: Week 3:  OT Short Term Goal 1 (Week 4): Patient will reach toward lower leg and foot with min assist using RUE in prep for lower body  OT Short Term Goal 2 (Week 4): Patient will utilize LUE for part of one functional task with min assist   Skilled Therapeutic Interventions/Progress Updates:     Patient in bed upon therapy arrival in gown and brief was soiled. Reports pain intermittently throughout session when BLEs were touched and moved. More pain experienced in LLE. No pain number provided. Monitored during session. Mother present during session.   Patient participated in skilled OT session focusing on ADL re-training, cognitive skills development, and functional transfers.   Pt distracted by repetitive verbalizations of pain in BLE (R<L) when ever touched or moved. Required increased VC to attend to task and increased rest breaks while educated on compensatory breathing techniques.   Pt participated in bed mobility while rolling right and left with Mod A using bed rails with increased VC for initiating task while physical assist was provided. OT provided total assist for incontinent brief change and peri care.  Max A provided for donning pants with pt assisting with pulling right side of pants up over hips. Max A provided to transition from sidelying to sitting on EOB. Initially demonstrated a posterior lean although with manual facilitation provided while positioned on pt's left side, Pt required only Min A to maintain static sitting balance.   Pt completed squat pivot transfer from bed to TIS with total A.   Therapy Documentation Precautions:  Precautions Precautions: Fall Precaution Comments: Contact precautions & delayed processing Required  Braces or Orthoses: Other Brace Other Brace: B adjustable night splints; B elbow "cosey" splints; L palm protector; R mitt Restrictions Weight Bearing Restrictions: No  Therapy/Group: Individual Therapy  Limmie Patricia, OTR/L,CBIS  Supplemental OT - [facility] and [facility] Secure Chat Preferred   02/28/2023, 7:55 PM

## 2023-02-28 NOTE — Progress Notes (Signed)
Physical Therapy Session Note  Patient Details  Name: [name] MRN: [id number] Date of Birth: 1976-03-28  Today's Date: 02/28/2023 PT Individual Time: 0802-0826 PT Individual Time Calculation (min): 24 min   Short Term Goals: Week 2:  PT Short Term Goal 1 (Week 2): Week 14; pt will complete bed mobility with totalA (pt <25%) PT Short Term Goal 2 (Week 2): Week 14: pt will maintain unsupported sitting balance for up to 1 minute consistently with CGA PT Short Term Goal 3 (Week 2): Week 14: Pt will complete bed<>chair transfers with totalA (pt <25%) PT Short Term Goal 4 (Week 2): Pt will be assessed for custom wheelchair needs  Skilled Therapeutic Interventions/Progress Updates:      Pt in bed to start - agreeable to therapy treatment. Pt able to recall this PT name after being asked to remember it yesterday during session. Removed B prevalon boots and provided gentle stretching for hamstrings and heel cords bilaterally. She continues to have increased pain with LLE stretching, which she reports is around her knee.   Worked on functional bed mobility - tried to engage her in bridging and she would engage but unable to clear her buttock. She does show more volitional movement of her R leg, but not her L. She demonstrated ability to roll towards her R side (once with only supervision assist!) but this was inconsistent in performance. Once positioned in R sidelying, she has difficulty letting go of bed rail to return to supine position.  Pt noted to be incontinent x2 after working on bed mobility. Dependent care for brief change, posterior pericare.  Charted in flowsheets.  Donned B adjustable night splints to both feet and adjusted to accommodate for lack of ROM and tight heel cords. Left patient in bed with bed flat to promote extension. All needs met.    Therapy Documentation Precautions:  Precautions Precautions: Fall Precaution Comments: Contact precautions & delayed  processing Required Braces or Orthoses: Other Brace Other Brace: B adjustable night splints; B elbow "cosey" splints; L palm protector; R mitt Restrictions Weight Bearing Restrictions: No General:    Other Treatments:      Therapy/Group: Individual Therapy  Ayahna Solazzo P Shinika Estelle  PT 02/28/2023, 7:42 AM

## 2023-02-28 NOTE — Progress Notes (Signed)
Speech Language Pathology Daily Session Note  Patient Details  Name: Nicole [name] MRN: [id number] Date of Birth: 04-Mar-1976  Today's Date: 02/28/2023 SLP Individual Time: 1450-1530 SLP Individual Time Calculation (min): 40 min  Short Term Goals: Week 9: SLP Short Term Goal 1 (Week 9): Week 14 - Pt will present w/ appropriate response to functional stimuli (i.e. accept spoon, grasp basic item appropriately, response to tactile/auditory stimuli) 80% of the time w/ minA multimodal cues SLP Short Term Goal 2 (Week 9): Week 14-Patient will utilize external aids for orientation to place and time with ModA multimodal cues SLP Short Term Goal 3 (Week 9): Week 14 - Pt will demonstrate sustained attention to basic tasks for 2 minutes with Mod A multimodal cues for redirection. SLP Short Term Goal 4 (Week 9): Week 14 - Pt will utilize increased vocal intensity w/ ModA verbal cues to remain 70% intelligibile at phrase level  Skilled Therapeutic Interventions: SLP conducted skilled therapy session targeting cognitive retraining goals. SLP greeted patient and assisted with transfer to speech therapy office after discussing progress thus far with mother. Patient received pain medication from LPN prior to transit. LPN asked if patient appropriate for whole meds in puree vs. Crushed. Given patient's continued tolerance of Dys3 diet and strong swallowing skills, recommend whole pills in puree. During conversation, patient responding to 100% of verbal stimuli and exhibits increased vocal intensity with 90% intelligibility throughout session with supervision. Suspect patient is not utilizing increased volume intentionally, rather speech intelligibility overall is improving as functional status improves. During card game task, required modA cues for redirection to sustain attention for 2 minutes. Patient required modA to orient to date. Patient was left in chair with call bell in reach and chair alarm set. SLP will  continue to target goals per plan of care.       Pain Pain Assessment Pain Scale: Faces Faces Pain Scale: Hurts even more Pain Location: Arm (when extending)  Therapy/Group: Individual Therapy  [name], M.A., CCC-SLP  [name] 02/28/2023, 4:02 PM

## 2023-02-28 NOTE — Progress Notes (Signed)
PROGRESS NOTE   Subjective/Complaints: Tolerating therapy Complains of food pain, feels her braces are too tight, asked nursing to remove prevalon boots for now Improved movement of right leg   ROS: Positives per HPI above. Denies fevers, chills, N/V, abdominal pain, SOB, chest pain, new weakness or paraesthesias.    Objective:   No results found. No results for input(s): "WBC", "HGB", "HCT", "PLT" in the last 72 hours.     No results for input(s): "NA", "K", "CL", "CO2", "GLUCOSE", "BUN", "CREATININE", "CALCIUM" in the last 72 hours.    Intake/Output Summary (Last 24 hours) at 02/28/2023 1020 Last data filed at 02/27/2023 1700 Gross per 24 hour  Intake 236 ml  Output --  Net 236 ml       Pressure Injury 02/09/23 Heel Left Deep Tissue Pressure Injury - Purple or maroon localized area of discolored intact skin or blood-filled blister due to damage of underlying soft tissue from pressure and/or shear. Black/purple and boggy,  size of a quar (Active)  02/09/23 0543  Location: Heel  Location Orientation: Left  Staging: Deep Tissue Pressure Injury - Purple or maroon localized area of discolored intact skin or blood-filled blister due to damage of underlying soft tissue from pressure and/or shear.  Wound Description (Comments): Black/purple and boggy,  size of a quarter.  Foam dressing applied  Present on Admission: No    Physical Exam: Vital Signs Blood pressure 121/89, pulse 81, temperature 98 F (36.7 C), temperature source Oral, resp. rate 18, height 5\' 1"  (1.549 m), weight 46.1 kg, SpO2 99%.  Gen: no distress, normal appearing HEENT: oral mucosa pink and moist, NCAT Cardio: Reg rate Chest: normal effort, normal rate of breathing Abd: soft, non-distended Ext: no edema Psych: pleasant, normal affect Abd: soft, non-distended, +BS Psych: flat affect, pressured speech, irritable mood Skin: + Scaling dried skin  on bilateral heels  - 2 small excoriations, sore on the base of the 5th metatarsal head -no longer present - rash on face - no longer present - stage 2 to right toe -no longer present  + area of redness between left thumb and index finger-now minimal, purple bruise left buttock unstageable - not examined +redness to bilateral elbows -not appreciated + Left heel stage III DTI, covered in foam dressing  Neurologic: AAOx2,  to year but not month or date. Follows most simple commands.  + Hypophonation, speech intelligability ~10% Twice daily for right upper extremity 1/5 finger flexion, otherwise no volitional movement of left upper extremity or bilateral lowers + BL UE WHO.   Prior exams: MAS 1 right elbow,MAS 1 in RIght finger and wrist flexors  MAS 3 knee flexors, and MAS 3 L>R  hip adductors  MAS 3 L elbow , MAS 3-4 in L wrist and finger flexors  + LUE WHO--- Hypersensitivity to left hand improved  Motor 0/5 strength in LUE and LLE, showing volitional movements in right lower extremity but difficulty to obtain MMT due to her pain/braces  Musculoskeletal: reduced bilateral shoulder and elbow ROM due to increased tone , pain with passive knee extension bilaterally   .   Assessment/Plan: 1. Functional deficits which require 3+ hours per day of interdisciplinary  therapy in a comprehensive inpatient rehab setting. Physiatrist is providing close team supervision and 24 hour management of active medical problems listed below. Physiatrist and rehab team continue to assess barriers to discharge/monitor patient progress toward functional and medical goals  Care Tool:  Bathing    Body parts bathed by patient: Face, Abdomen, Chest, Left upper leg, Right upper leg   Body parts bathed by helper: Right arm, Left arm, Front perineal area, Buttocks, Left lower leg, Right lower leg Body parts n/a: Right arm, Left arm, Front perineal area, Buttocks, Right upper leg, Left upper leg, Right lower leg,  Left lower leg   Bathing assist Assist Level: Maximal Assistance - Patient 24 - 49%     Upper Body Dressing/Undressing Upper body dressing   What is the patient wearing?: Pull over shirt    Upper body assist Assist Level: Moderate Assistance - Patient 50 - 74%    Lower Body Dressing/Undressing Lower body dressing      What is the patient wearing?: Pants, Incontinence brief     Lower body assist Assist for lower body dressing: Total Assistance - Patient < 25%     Toileting Toileting Toileting Activity did not occur Press photographer and hygiene only): N/A (no void or bm)  Toileting assist Assist for toileting: Dependent - Patient 0%     Transfers Chair/bed transfer  Transfers assist  Chair/bed transfer activity did not occur: Safety/medical concerns  Chair/bed transfer assist level: Dependent - Patient 0%     Locomotion Ambulation   Ambulation assist   Ambulation activity did not occur: Safety/medical concerns  Assist level: 2 helpers   Max distance: 78ft   Walk 10 feet activity   Assist  Walk 10 feet activity did not occur: Safety/medical concerns  Assist level: 2 helpers     Walk 50 feet activity   Assist Walk 50 feet with 2 turns activity did not occur: Safety/medical concerns         Walk 150 feet activity   Assist Walk 150 feet activity did not occur: Safety/medical concerns         Walk 10 feet on uneven surface  activity   Assist Walk 10 feet on uneven surfaces activity did not occur: Safety/medical concerns         Wheelchair     Assist Is the patient using a wheelchair?: Yes Type of Wheelchair: Manual    Wheelchair assist level: Dependent - Patient 0% Max wheelchair distance: 36ft    Wheelchair 50 feet with 2 turns activity    Assist    Wheelchair 50 feet with 2 turns activity did not occur: Safety/medical concerns   Assist Level: Dependent - Patient 0%   Wheelchair 150 feet activity      Assist  Wheelchair 150 feet activity did not occur: Safety/medical concerns   Assist Level: Dependent - Patient 0%   Blood pressure 121/89, pulse 81, temperature 98 F (36.7 C), temperature source Oral, resp. rate 18, height 5\' 1"  (1.549 m), weight 46.1 kg, SpO2 99%.  Medical Problem List and Plan: 1. 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 Code status  DNR Continue CIR therapies including PT, OT SLP  2.  Impaired mobility: continue Lovenox, weekly creatinine ordered             -antiplatelet therapy: N/A  3. Knee paint: Tylenol as needed, continue voltaren gel scheduled. Prescribing Home Zynex NexWave   XR reviewed and is stable, discussed with therapy  4. History of anxiety: discussed with her mom that she feels this was a big risk factor for her accident  5. Neuropsych/cognition: This patient is not capable of making decisions on her own behalf  - 10/12: Still not capable of medical decision making; BUT oriented and communicating, following directions - MUCH improved! . 6. Left buttock unstageable ulcer.  Continue Medihoney to buttock wound daily cover with foam dressing.  Changing dressing every 3 days or as needed soiling   7. Fluids/Electrolytes/Nutrition: Routine in and outs with follow-up chemistries  -Eating 100% most meals with assist and encouragement  8.  Cavitary right lower lobe pneumonia likely aspiration pneumonia/MRSA pneumonia.  Resolved    Latest Ref Rng & Units  02/05/2023   11:12 AM 01/22/2023    6:36 AM 12/20/2022   10:54 AM  CBC  WBC 4.0 - 10.5 K/uL 8.5  8.1  5.6   Hemoglobin 12.0 - 15.0 g/dL 16.1  09.6  04.5   Hematocrit 36.0 - 46.0 % 46.7  47.5  42.8   Platelets 150 - 400 K/uL 284  274  253      9.  History of drug abuse.  Positive cocaine on urine drug screen.  Provide counseling  10.  AKI/hypovolemia and ATN.  resolved    Latest Ref Rng & Units 02/23/2023   10:37 AM 02/16/2023    7:49 AM 02/09/2023    5:57 AM  BMP  Creatinine 0.44 - 1.00 mg/dL 4.09  8.11  9.14     11.  Mild transaminitis with rhabdomyolysis.  Both resolved  12.  Hypotension. Resolved, d/c midodrine  BP reviewed and is stable.  13. Impaired initiation: continue amantadine 100mg  BID  14. Hyperlycemia: CBGs mildly elevated, d/ced checks  15. Spasticity:   -discussed serial casting with OT and her mom  - continue baclofen to 20mg  TID and tizanidine to 4mg  TID. Continue wrist, elbow brace, and PRAFOs. Ordered a night splint for LLE  Repeat CMP today given chronic tizanidine use  -9/7 continues to have significant tone despite use of tizanidine and baclofen, may be beneficial consider baclofen pump in outpatient setting if this causes significant difficulties in care  9/8 consider decrease baclofen if dyskinetic movements continue  10/5-6  continue orthotics, ROM, positioning  when possible 16. Bowel and bladder incontinence: continue bowel and bladder program  17. Left hand skin maceration: Eucerin ordered, discussed hand split with OT, conitnue  18. History of magnesium deficiency: magnesium 500mg  started HS    02/28/2023    5:07 AM 02/27/2023    7:21 PM 02/27/2023   12:52 PM  Vitals with BMI  Systolic 121 105 782  Diastolic 89 73 89  Pulse 81 88 89    19. HTN: see above Increase tizanidine to 6mg  TID. Magnesium supplement added HS. Increase propanolol to 20mg  TID, continue this dose  - 10/12: Normotensive, monitor    02/28/2023    5:07 AM 02/27/2023     7:21 PM 02/27/2023   12:52 PM  Vitals with BMI  Systolic 121 105 161  Diastolic 89 73 89  Pulse 81 88 89     20. Area of redness between left thumb and index finger: continue foam dressing  21. Scalp eczema: selsun blue ordered, continue  -9/7 patient's mother feels like this is causing her to itch, will start some hydrocortisone cream  22. Fatigue: improved, discussed with team may be secondary to anti-spasticity medications, B12 reviewed and is 335, in suboptimal range, will give 1,000U B12 injection 8/7, will order monthly as needed, metanx started, increase to BID, continue  23. Right wrist swelling: XR ordered and shows no acute fracture, shows 2.75mm ulnar variance, brace removed to given patient a break from it. Voltaren gel ordered prn for discomfort, continue, CT ordered and show no acute osseus injury, continue ace wrap  23. Dysphagia: continue D3/thins, d/c ensure as blood pressure severely elevates whenever restarted  24. MRSA nares positive: negative on repeat, precautions d/ced  25. Tachycardic: resolved, increase propanolol to BID.  continueTizanidine increased to 6mg  TID. Resolved, amantadine increased back to 100mg  BID, continue this dose  26.  Dyskinetic movements in head and neck - appears to be myoclonic jerks related to hypoxic brain injury - as discussed with pt mom, no specific meds for this on antispasticity meds , consioder benzo although with pt's SUD would avoid this class Appear to have resolved, EEG reviewed and was negative for seizure.   27. Small sore on the base of 5th metatarsal head: local wound care, continue  28. Rash on face: resolved, discussed with mother could have been secondary to added sugars in nutritional supplements  29. Sacral pressure injury: air mattress ordered, Placed nursing order to offload q1 hour while awake, continue medihoney, wound care consulted, discussed with nursing that this is improving  30. Cervical extensor weakness:  tried decreasing tizanidine but spasticity appears increased and BP elevated so will increase back to 6mg  TID, continue this dose  31. Low protein stores: prosource ordered, continue  32. Intermittent lethargy: labs reviewed and are stable, discussed with team may be secondary to anti-spasticity medications  33. Blood blister to left heel: continue foam heel protectors.  34. Suboptimal vitamin D level: increase to 5,000 U daily. continue this dose  35. Hypotension: decrease propanolol to 10mg  daily, resolved  26. Foot pain: tramadol ordered prn   LOS: 102 days A FACE TO FACE EVALUATION WAS PERFORMED  Nicole [name] P Wm Fruchter 02/28/2023, 10:20 AM

## 2023-03-01 ENCOUNTER — Inpatient Hospital Stay (HOSPITAL_COMMUNITY): Payer: MEDICAID

## 2023-03-01 NOTE — Progress Notes (Signed)
Occupational Therapy Session Note  Patient Details  Name: Nicole [name] MRN: [id number] Date of Birth: [birth year]  Today's Date: 03/01/2023 OT Individual Time: 4098-1191 OT Individual Time Calculation (min): 45 min    Short Term Goals: Week 14:  OT Short Term Goal 1 (Week 14): Patient will reach toward lower leg and foot with min assist using RUE in prep for lower body OT Short Term Goal 2 (Week 14): Patient will utilize LUE for part of one functional task with min assist OT Short Term Goal 2 - Progress (Week 4): Other (comment)  Skilled Therapeutic Interventions/Progress Updates:   Patient received sleeping in bed.  Very difficult to arouse.  Patient needed increased time and position change to get out of bed.  Patient's BP low - 83/63 - in supine and dropped to 83/59, not symptomatic.  Patient able to assist with rolling toward left - anticipating with right hand and grabbing at railing.  Able to scoot forward slightly at edge of bed.  Patient had LE activation to assist with lifting off surface!  Transported to gym - worked on components of sit to lift off.  Used table in front to stabilize Ue's and facilitate upright trunk - pushed table  forward for forward flexion then patient attempting to help with lift.  Transported to parallel bars and allowed patient to pull self forward with RUE - LUE propped on bar.  Patient reports pain with therapist knee in contact with her knee, reports discomfort in feet.  Patient assisted to full stand and able to sustain with mod assist x 15 seconds.  Patient returned to room and NT asked for her to stay up in chair.    Therapy Documentation Precautions:  Precautions Precautions: Fall Precaution Comments: Contact precautions & delayed processing Required Braces or Orthoses: Other Brace Other Brace: B adjustable night splints; B elbow "cosey" splints; L palm protector; R mitt Restrictions Weight Bearing Restrictions: No   Pain:  Reports pain in knees,  feet    Therapy/Group: Individual Therapy  [name] 03/01/2023, 11:58 AM

## 2023-03-01 NOTE — Progress Notes (Signed)
Physical Therapy Session Note  Patient Details  Name: Nicole [name] MRN: [id number] Date of Birth: 1975-08-06  Today's Date: 03/01/2023 PT Individual Time: 1115-1200 PT Individual Time Calculation (min): 45 min   Short Term Goals: Week 2:  PT Short Term Goal 1 (Week 2): Week 14; pt will complete bed mobility with totalA (pt <25%) PT Short Term Goal 2 (Week 2): Week 14: pt will maintain unsupported sitting balance for up to 1 minute consistently with CGA PT Short Term Goal 3 (Week 2): Week 14: Pt will complete bed<>chair transfers with totalA (pt <25%) PT Short Term Goal 4 (Week 2): Pt will be assessed for custom wheelchair needs  Skilled Therapeutic Interventions/Progress Updates:      Pt sitting in TIS w/c on arrival. No signs of resting pain.  Transported to day room rehab gym. Assisted onto Nustep with totalA squat pivot transfer - pt seemed to want to initiate the transfer with forward weight shift and slight reaching w/ her RUE. Assisted for setupA on Nustep with BLE only due to limited elbow extension bilaterally. Patient c/o L knee discomfort with AROM in both flexion/extension directions. Even left LLE off the step and tried to work on R sided only but patient resistant into extension with RLE and pushing against PT aid.   Took patient to the main rehab gym and setup outside // bars. Used sheet method behind her hips and a pillow b/w PT and patient's knees for comfort. Pt assisted into standing with totalA with this method. Pt able to stand for ~30 seconds before requesting to sit down due to discomfort (probably from heel cord tightness, hip flexor tightness, etc). [name] posture in standing quite flexed at the hips and trunk with knees caving into genu valgus.   Pt returned to her room - left sitting up in TIS w/c (reclined) with pillows supporting both UE 's.    Therapy Documentation Precautions:  Precautions Precautions: Fall Precaution Comments: Contact precautions & delayed  processing Required Braces or Orthoses: Other Brace Other Brace: B adjustable night splints; B elbow "cosey" splints; L palm protector; R mitt Restrictions Weight Bearing Restrictions: No General:     Therapy/Group: Individual Therapy  Orrin Brigham 03/01/2023, 7:44 AM

## 2023-03-01 NOTE — Progress Notes (Signed)
PROGRESS NOTE   Subjective/Complaints: Tolerating therapy Much improved cognition but still with significant motor deficits Order placed for meds crushed in puree   ROS: Positives per HPI above. Denies fevers, chills, N/V, abdominal pain, SOB, chest pain, new weakness or paraesthesias.    Objective:   No results found. No results for input(s): "WBC", "HGB", "HCT", "PLT" in the last 72 hours.     No results for input(s): "NA", "K", "CL", "CO2", "GLUCOSE", "BUN", "CREATININE", "CALCIUM" in the last 72 hours.    Intake/Output Summary (Last 24 hours) at 03/01/2023 1203 Last data filed at 03/01/2023 0800 Gross per 24 hour  Intake 576 ml  Output --  Net 576 ml       Pressure Injury 02/09/23 Heel Left Deep Tissue Pressure Injury - Purple or maroon localized area of discolored intact skin or blood-filled blister due to damage of underlying soft tissue from pressure and/or shear. Black/purple and boggy,  size of a quar (Active)  02/09/23 0543  Location: Heel  Location Orientation: Left  Staging: Deep Tissue Pressure Injury - Purple or maroon localized area of discolored intact skin or blood-filled blister due to damage of underlying soft tissue from pressure and/or shear.  Wound Description (Comments): Black/purple and boggy,  size of a quarter.  Foam dressing applied  Present on Admission: No    Physical Exam: Vital Signs Blood pressure 115/81, pulse 86, temperature 97.8 F (36.6 C), resp. rate 15, height 5\' 1"  (1.549 m), weight 46.1 kg, SpO2 98%.  Gen: no distress, normal appearing HEENT: oral mucosa pink and moist, NCAT Cardio: Reg rate Chest: normal effort, normal rate of breathing Abd: soft, non-distended Ext: no edema Psych: pleasant, normal affect Abd: soft, non-distended, +BS Psych: flat affect, pressured speech, irritable mood Skin: + Scaling dried skin on bilateral heels  - 2 small excoriations, sore  on the base of the 5th metatarsal head -no longer present - rash on face - no longer present - stage 2 to right toe -no longer present  + area of redness between left thumb and index finger-now minimal, purple bruise left buttock unstageable - not examined +redness to bilateral elbows -not appreciated + Left heel stage III DTI, covered in foam dressing  Neurologic: AAOx2,  to year but not month or date. Follows most simple commands.  + Hypophonation, speech intelligability ~10% Twice daily for right upper extremity 1/5 finger flexion, otherwise no volitional movement of left upper extremity or bilateral lowers + BL UE WHO.   Prior exams: MAS 1 right elbow,MAS 1 in RIght finger and wrist flexors  MAS 3 knee flexors, and MAS 3 L>R  hip adductors  MAS 3 L elbow , MAS 3-4 in L wrist and finger flexors  + LUE WHO--- Hypersensitivity to left hand improved  Motor 0/5 strength in LUE and LLE, showing volitional movements in right lower extremity but difficulty to obtain MMT due to her pain/braces, stable 10/17  Musculoskeletal: reduced bilateral shoulder and elbow ROM due to increased tone , pain with passive knee extension bilaterally   .   Assessment/Plan: 1. Functional deficits which require 3+ hours per day of interdisciplinary therapy in a comprehensive inpatient rehab setting. Physiatrist  is providing close team supervision and 24 hour management of active medical problems listed below. Physiatrist and rehab team continue to assess barriers to discharge/monitor patient progress toward functional and medical goals  Care Tool:  Bathing    Body parts bathed by patient: Face, Abdomen, Chest, Left upper leg, Right upper leg   Body parts bathed by helper: Right arm, Left arm, Front perineal area, Buttocks, Left lower leg, Right lower leg Body parts n/a: Right arm, Left arm, Front perineal area, Buttocks, Right upper leg, Left upper leg, Right lower leg, Left lower leg   Bathing assist  Assist Level: Maximal Assistance - Patient 24 - 49%     Upper Body Dressing/Undressing Upper body dressing   What is the patient wearing?: Pull over shirt    Upper body assist Assist Level: Moderate Assistance - Patient 50 - 74%    Lower Body Dressing/Undressing Lower body dressing      What is the patient wearing?: Pants, Incontinence brief     Lower body assist Assist for lower body dressing: Total Assistance - Patient < 25%     Toileting Toileting Toileting Activity did not occur Press photographer and hygiene only): N/A (no void or bm)  Toileting assist Assist for toileting: Dependent - Patient 0%     Transfers Chair/bed transfer  Transfers assist  Chair/bed transfer activity did not occur: Safety/medical concerns  Chair/bed transfer assist level: Dependent - Patient 0%     Locomotion Ambulation   Ambulation assist   Ambulation activity did not occur: Safety/medical concerns  Assist level: 2 helpers   Max distance: 76ft   Walk 10 feet activity   Assist  Walk 10 feet activity did not occur: Safety/medical concerns  Assist level: 2 helpers     Walk 50 feet activity   Assist Walk 50 feet with 2 turns activity did not occur: Safety/medical concerns         Walk 150 feet activity   Assist Walk 150 feet activity did not occur: Safety/medical concerns         Walk 10 feet on uneven surface  activity   Assist Walk 10 feet on uneven surfaces activity did not occur: Safety/medical concerns         Wheelchair     Assist Is the patient using a wheelchair?: Yes Type of Wheelchair: Manual    Wheelchair assist level: Dependent - Patient 0% Max wheelchair distance: 58ft    Wheelchair 50 feet with 2 turns activity    Assist    Wheelchair 50 feet with 2 turns activity did not occur: Safety/medical concerns   Assist Level: Dependent - Patient 0%   Wheelchair 150 feet activity     Assist  Wheelchair 150 feet activity  did not occur: Safety/medical concerns   Assist Level: Dependent - Patient 0%   Blood pressure 115/81, pulse 86, temperature 97.8 F (36.6 C), resp. rate 15, height 5\' 1"  (1.549 m), weight 46.1 kg, SpO2 98%.  Medical Problem List and Plan: 1. 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 Code status  DNR Continue CIR therapies including PT, OT SLP  2.  Impaired mobility: continue Lovenox, weekly creatinine ordered             -antiplatelet therapy: N/A  3. Knee paint: Tylenol as needed, continue voltaren gel scheduled. Prescribing Home Zynex NexWave   XR reviewed and is stable, discussed with therapy  4. History of anxiety: discussed with her mom that she feels this was a big risk factor for her accident  5. Neuropsych/cognition: This patient is not capable of making decisions on her own behalf  - 10/12: Still not capable of medical decision making; BUT oriented and communicating, following directions - MUCH improved! . 6. Left buttock unstageable ulcer.  Continue Medihoney to buttock wound daily cover with foam dressing.  Changing dressing every 3 days or as needed soiling   7. Fluids/Electrolytes/Nutrition: Routine in and outs with follow-up chemistries  -Eating 100% most meals with assist and encouragement  8.  Cavitary right lower lobe pneumonia likely aspiration pneumonia/MRSA pneumonia.  Resolved    Latest Ref Rng & Units 02/05/2023   11:12 AM 01/22/2023    6:36 AM 12/20/2022   10:54 AM  CBC  WBC  4.0 - 10.5 K/uL 8.5  8.1  5.6   Hemoglobin 12.0 - 15.0 g/dL 40.9  81.1  91.4   Hematocrit 36.0 - 46.0 % 46.7  47.5  42.8   Platelets 150 - 400 K/uL 284  274  253      9.  History of drug abuse.  Positive cocaine on urine drug screen.  Provide counseling  10.  AKI/hypovolemia and ATN.  resolved    Latest Ref Rng & Units 02/23/2023   10:37 AM 02/16/2023    7:49 AM 02/09/2023    5:57 AM  BMP  Creatinine 0.44 - 1.00 mg/dL 7.82  9.56  2.13     11.  Mild transaminitis with rhabdomyolysis.  Both resolved  12.  Hypotension. Resolved, d/c midodrine  BP reviewed and is stable.  13. Impaired initiation: continue amantadine 100mg  BID  14. Hyperlycemia: CBGs mildly elevated, d/ced checks  15. Spasticity:   -discussed serial casting with OT and her mom  - continue baclofen to 20mg  TID and tizanidine to 4mg  TID. Continue wrist, elbow brace, and PRAFOs. Ordered a night splint for LLE  Repeat CMP today given chronic tizanidine use  -9/7 continues to have significant tone despite use of tizanidine and baclofen, may be beneficial consider baclofen pump in outpatient setting if this causes significant difficulties in care  9/8 consider decrease baclofen if dyskinetic movements continue  10/5-6  continue orthotics, ROM, positioning  when possible 16. Bowel and bladder incontinence: continue bowel and bladder program  17. Left hand skin maceration: Eucerin ordered, discussed hand split with OT, conitnue  18. History of magnesium deficiency: magnesium 500mg  started HS    03/01/2023    3:34 AM 02/28/2023    7:19 PM 02/28/2023    3:46 PM  Vitals with BMI  Systolic 115 110 086  Diastolic 81 86 74  Pulse 86 93 91    19. HTN: see above Increase tizanidine to 6mg  TID. Magnesium supplement added HS. Increase propanolol to 20mg  TID, continue this dose  - 10/12: Normotensive, monitor    03/01/2023    3:34 AM 02/28/2023    7:19 PM 02/28/2023    3:46 PM  Vitals with BMI  Systolic 115 110 578  Diastolic 81 86 74  Pulse 86 93 91     20. Area of redness between left thumb and index finger: continue foam dressing  21. Scalp eczema: selsun blue ordered, continue  -9/7 patient's mother feels like this is causing her to itch, will start some hydrocortisone cream  22. Fatigue: improved, discussed with team may be secondary to anti-spasticity medications, B12 reviewed and is 335, in suboptimal range, will give 1,000U B12 injection 8/7, will order monthly as needed, metanx started, increase to BID, continue  23. Right wrist swelling: XR ordered and shows no acute fracture, shows 2.51mm ulnar variance, brace removed to given patient a break from it. Voltaren gel ordered prn for discomfort, continue, CT ordered and show no acute osseus injury, continue ace wrap  23. Dysphagia: continue D3/thins, d/c ensure as blood pressure severely elevates whenever restarted  24. MRSA nares positive: negative on repeat, precautions d/ced  25. Tachycardic: resolved, d/c propanolol.  continueTizanidine increased to 6mg  TID. Resolved, amantadine increased back to 100mg  BID, continue this dose  26.  Dyskinetic movements in head and neck - appears to be myoclonic jerks related to hypoxic brain injury - as discussed with pt mom, no specific meds for this on antispasticity meds , consioder benzo although with pt's SUD would avoid this class Appear to have resolved, EEG reviewed and was negative for seizure.   27. Small sore on the base of 5th metatarsal head: local wound care, continue  28. Rash on face: resolved, discussed with mother could have been secondary to added sugars in nutritional supplements  29. Sacral pressure injury: air mattress ordered, Placed nursing order to offload q1 hour while awake, continue medihoney, wound care consulted, discussed with nursing that this is improving  30. Cervical extensor weakness: tried decreasing tizanidine but spasticity appears increased and BP elevated so will  increase back to 6mg  TID, continue this dose  31. Low protein stores: prosource ordered, continue  32. Intermittent lethargy: labs reviewed and are stable, discussed with team may be secondary to anti-spasticity medications  33. Blood blister to left heel: continue foam heel protectors.  34. Suboptimal vitamin D level: increase to 5,000 U daily. continue this dose  35. Hypotension: d/c propanolol  26. Foot pain: tramadol ordered prn, improved today   LOS: 103 days A FACE TO FACE EVALUATION WAS PERFORMED  Nicole [name] P Nicole [name] 03/01/2023, 12:03 PM

## 2023-03-01 NOTE — Progress Notes (Signed)
Speech Language Pathology Daily Session Note  Patient Details  Name: KASIDY GIANINO MRN: [id number] Date of Birth: 01/09/1976  Today's Date: 03/01/2023 SLP Individual Time: 0822-0852 SLP Individual Time Calculation (min): 30 min  Short Term Goals: Week 9: SLP Short Term Goal 1 (Week 9): Week 14 - Pt will present w/ appropriate response to functional stimuli (i.e. accept spoon, grasp basic item appropriately, response to tactile/auditory stimuli) 80% of the time w/ minA multimodal cues SLP Short Term Goal 2 (Week 9): Week 14-Patient will utilize external aids for orientation to place and time with ModA multimodal cues SLP Short Term Goal 3 (Week 9): Week 14 - Pt will demonstrate sustained attention to basic tasks for 2 minutes with Mod A multimodal cues for redirection. SLP Short Term Goal 4 (Week 9): Week 14 - Pt will utilize increased vocal intensity w/ ModA verbal cues to remain 70% intelligibile at phrase level  Skilled Therapeutic Interventions: Skilled treatment session focused on cognitive goals. Upon arrival, patient was awake in bed. Patient initially with appropriate vocal intensity at the word and phrase level during basic social exchanges and while reading a calendar out loud for 100% intelligibility. However,  as session progressed, the length of utterances increased as well as the cognitive demand which appeared to impact the patient's overall intelligibility. Patient was independently oriented to place and situation with supervision level verbal cues needed for orientation to time with use of calendar. Patient also participated in a mildly complex working memory task of recalling appointments details for SLP to transcribe onto a calendar with overall Mod A verbal and visual cues needed.   Of note, patient observed with medications whole in puree. Patient masticated all pills presented whole, therefore, recommend patient continue to receive medications crushed in puree. Nursing aware of  recommendations and signs/orders have also been adjusted to reflect changes.   Patient left upright in bed with all needs within reach. Continue with current plan of care.       Pain No/Denies Pain   Therapy/Group: Individual Therapy  Shaquan Puerta 03/01/2023, 8:56 AM

## 2023-03-02 LAB — CREATININE, SERUM
Creatinine, Ser: 0.59 mg/dL (ref 0.44–1.00)
GFR, Estimated: >60 mL/min (ref >60)

## 2023-03-02 MED ORDER — BACLOFEN 5 MG HALF TABLET
15.0000 mg | ORAL_TABLET | Freq: Three times a day (TID) | ORAL | Status: DC
  Administered 2023-03-02 – 2023-03-16 (×42): 15 mg via ORAL
  Filled 2023-03-02 (×41): qty 1

## 2023-03-02 NOTE — Progress Notes (Signed)
Physical Therapy Session Note  Patient Details  Name: Nicole [name] MRN: [id number] Date of Birth: 1976/04/13  Today's Date: 03/02/2023 PT Individual Time: 1130-1200 PT Individual Time Calculation (min): 30 min   Short Term Goals: Week 2:  PT Short Term Goal 1 (Week 2): Week 14; pt will complete bed mobility with totalA (pt <25%) PT Short Term Goal 2 (Week 2): Week 14: pt will maintain unsupported sitting balance for up to 1 minute consistently with CGA PT Short Term Goal 3 (Week 2): Week 14: Pt will complete bed<>chair transfers with totalA (pt <25%) PT Short Term Goal 4 (Week 2): Pt will be assessed for custom wheelchair needs  Skilled Therapeutic Interventions/Progress Updates:      Pt presents in bed to start - agreeable to PT tx. Pt able to recall events last night that led to her fall but unreliable historian. Pt reports she "fell out of the crib" last night and "hit her head." Pt reports she was alone and all bed rails were up. Team is aware of CT head was negative.   Pt noted to be soiled with wet brief and gown. TotalA for brief change and during this, patient with incontinent of BM during brief change. TotalA for pericare. She's now rolling to her L side with minA (!) but needs maxA to roll to her R side.   Donned clean pants, shoes, and shirt with totalA at bed level. She completed supine<>sitting EOB with totalA and worked some on sitting balance at EOB. She presents with a heavy posterior lean in sitting, somewhat extending when trying to push her trunk upright. Completed totalA squat pivot transfer into TIS w/c. Reclined for safety and used chest strap for added safety. All needs met at end of treatment.     Therapy Documentation Precautions:  Precautions Precautions: Fall Precaution Comments: Contact precautions & delayed processing Required Braces or Orthoses: Other Brace Other Brace: B adjustable night splints; B elbow "cosey" splints; L palm protector; R  mitt Restrictions Weight Bearing Restrictions: No General:      Therapy/Group: Individual Therapy  Donie Lemelin P Tashunda Vandezande 03/02/2023, 7:40 AM

## 2023-03-02 NOTE — Progress Notes (Signed)
PROGRESS NOTE   Subjective/Complaints: Had fall last night and hit head, CT reviewed and stable No new complaints this morning No changes to exam   ROS: Positives per HPI above. Denies fevers, chills, N/V, abdominal pain, SOB, chest pain, new weakness or paraesthesias.    Objective:   CT HEAD WO CONTRAST ( )  Result Date: 03/01/2023 CLINICAL DATA:  Unwitnessed fall hit head EXAM: CT HEAD WITHOUT CONTRAST TECHNIQUE: Contiguous axial images were obtained from the base of the skull through the vertex without intravenous contrast. RADIATION DOSE REDUCTION: This exam was performed according to the departmental dose-optimization program which includes automated exposure control, adjustment of the mA and/or kV according to patient size and/or use of iterative reconstruction technique. COMPARISON:  MRI 11/24/2022 FINDINGS: Brain: No hemorrhage or intracranial mass. The ventricles are stable in size. Abnormal white matter hypodensity with interim finding of small areas of cystic encephalomalacia most evident in the bilateral periventricular white matter, left periatrial white matter and globi pallidi. Vascular: No hyperdense vessels.  No unexpected calcification Skull: Normal. Negative for fracture or focal lesion. Sinuses/Orbits: No acute finding. Other: None IMPRESSION: 1. No CT evidence for acute intracranial abnormality. No hemorrhage. 2. White matter hypodensity with interim development of small areas of cystic encephalomalacia most evident in the bilateral periventricular white matter, left periatrial white matter and globi pallidi. Findings are likely due to sequela of prior toxic/hypoxic insult as seen on the prior exams. Electronically Signed   By: Jasmine Pang M.D.   On: 03/01/2023 22:24   No results for input(s): "WBC", "HGB", "HCT", "PLT" in the last 72 hours.     Recent Labs    03/02/23 0724  CREATININE 0.59       Intake/Output Summary (Last 24 hours) at 03/02/2023 1049 Last data filed at 03/02/2023 0758 Gross per 24 hour  Intake 340 ml  Output --  Net 340 ml       Pressure Injury 02/09/23 Heel Left Deep Tissue Pressure Injury - Purple or maroon localized area of discolored intact skin or blood-filled blister due to damage of underlying soft tissue from pressure and/or shear. Black/purple and boggy,  size of a quar (Active)  02/09/23 0543  Location: Heel  Location Orientation: Left  Staging: Deep Tissue Pressure Injury - Purple or maroon localized area of discolored intact skin or blood-filled blister due to damage of underlying soft tissue from pressure and/or shear.  Wound Description (Comments): Black/purple and boggy,  size of a quarter.  Foam dressing applied  Present on Admission: No    Physical Exam: Vital Signs Blood pressure (!) 113/37, pulse 95, temperature 98.2 F (36.8 C), temperature source Oral, resp. rate 16, height 5\' 1"  (1.549 m), weight 46.1 kg, SpO2 100%.  Gen: no distress, normal appearing HEENT: oral mucosa pink and moist, NCAT Cardio: Reg rate Chest: normal effort, normal rate of breathing Abd: soft, non-distended Ext: no edema Psych: pleasant, normal affect Abd: soft, non-distended, +BS Psych: flat affect, pressured speech, irritable mood Skin: + Scaling dried skin on bilateral heels  - 2 small excoriations, sore on the base of the 5th metatarsal head -no longer present - rash on face - no longer present -  stage 2 to right toe -no longer present  + area of redness between left thumb and index finger-now minimal, purple bruise left buttock unstageable - not examined +redness to bilateral elbows -not appreciated + Left heel stage III DTI, covered in foam dressing  Neurologic: AAOx2,  to year but not month or date. Follows most simple commands.  + Hypophonation, speech intelligability ~10% Twice daily for right upper extremity 1/5 finger flexion,  otherwise no volitional movement of left upper extremity or bilateral lowers + BL UE WHO.   Prior exams: MAS 1 right elbow,MAS 1 in RIght finger and wrist flexors  MAS 3 knee flexors, and MAS 3 L>R  hip adductors  MAS 3 L elbow , MAS 3-4 in L wrist and finger flexors  + LUE WHO--- Hypersensitivity to left hand improved  Motor 0/5 strength in LUE and LLE, showing volitional movements in right lower extremity but difficulty to obtain MMT due to her pain/braces, stable 10/18  Musculoskeletal: reduced bilateral shoulder and elbow ROM due to increased tone , pain with passive knee extension bilaterally   .   Assessment/Plan: 1. Functional deficits which require 3+ hours per day of interdisciplinary therapy in a comprehensive inpatient rehab setting. Physiatrist is providing close team supervision and 24 hour management of active medical problems listed below. Physiatrist and rehab team continue to assess barriers to discharge/monitor patient progress toward functional and medical goals  Care Tool:  Bathing    Body parts bathed by patient: Face, Abdomen, Chest, Left upper leg, Right upper leg   Body parts bathed by helper: Right arm, Left arm, Front perineal area, Buttocks, Left lower leg, Right lower leg Body parts n/a: Right arm, Left arm, Front perineal area, Buttocks, Right upper leg, Left upper leg, Right lower leg, Left lower leg   Bathing assist Assist Level: Maximal Assistance - Patient 24 - 49%     Upper Body Dressing/Undressing Upper body dressing   What is the patient wearing?: Pull over shirt    Upper body assist Assist Level: Moderate Assistance - Patient 50 - 74%    Lower Body Dressing/Undressing Lower body dressing      What is the patient wearing?: Pants, Incontinence brief     Lower body assist Assist for lower body dressing: Total Assistance - Patient < 25%     Toileting Toileting Toileting Activity did not occur Press photographer and hygiene only):  N/A (no void or bm)  Toileting assist Assist for toileting: Dependent - Patient 0%     Transfers Chair/bed transfer  Transfers assist  Chair/bed transfer activity did not occur: Safety/medical concerns  Chair/bed transfer assist level: Dependent - Patient 0%     Locomotion Ambulation   Ambulation assist   Ambulation activity did not occur: Safety/medical concerns  Assist level: 2 helpers   Max distance: 87ft   Walk 10 feet activity   Assist  Walk 10 feet activity did not occur: Safety/medical concerns  Assist level: 2 helpers     Walk 50 feet activity   Assist Walk 50 feet with 2 turns activity did not occur: Safety/medical concerns         Walk 150 feet activity   Assist Walk 150 feet activity did not occur: Safety/medical concerns         Walk 10 feet on uneven surface  activity   Assist Walk 10 feet on uneven surfaces activity did not occur: Safety/medical concerns         Wheelchair  Assist Is the patient using a wheelchair?: Yes Type of Wheelchair: Manual    Wheelchair assist level: Dependent - Patient 0% Max wheelchair distance: 40ft    Wheelchair 50 feet with 2 turns activity    Assist    Wheelchair 50 feet with 2 turns activity did not occur: Safety/medical concerns   Assist Level: Dependent - Patient 0%   Wheelchair 150 feet activity     Assist  Wheelchair 150 feet activity did not occur: Safety/medical concerns   Assist Level: Dependent - Patient 0%   Blood pressure (!) 113/37, pulse 95, temperature 98.2 F (36.8 C), temperature source Oral, resp. rate 16, height 5\' 1"  (1.549 m), weight 46.1 kg, SpO2 100%.  Medical Problem List and Plan: 1. 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 Code status  DNR Continue CIR therapies including PT, OT SLP  2.  Impaired mobility: continue Lovenox, weekly creatinine ordered             -antiplatelet therapy: N/A  3. Knee paint: Tylenol as needed, continue voltaren gel scheduled. Prescribing Home Zynex NexWave   XR reviewed and is stable, discussed with therapy  4. History of anxiety: discussed with her mom that she feels this was a big risk factor for her accident  5. Neuropsych/cognition: This patient is not capable of making decisions on her own behalf  - 10/12: Still not capable of medical decision making; BUT oriented and communicating, following directions - MUCH improved! . 6. Left buttock unstageable ulcer.  Continue Medihoney to buttock wound daily cover with foam dressing.  Changing dressing every 3 days or as needed soiling   7. Fluids/Electrolytes/Nutrition: Routine in and outs with follow-up chemistries  -Eating 100% most meals with assist and encouragement  8.  Cavitary right lower lobe pneumonia likely aspiration pneumonia/MRSA pneumonia.  Resolved    Latest Ref Rng & Units 02/05/2023   11:12 AM 01/22/2023    6:36 AM 12/20/2022   10:54 AM  CBC  WBC 4.0 - 10.5 K/uL 8.5  8.1  5.6   Hemoglobin 12.0 - 15.0 g/dL 62.3  76.2  83.1   Hematocrit 36.0 - 46.0 % 46.7  47.5  42.8   Platelets 150 - 400 K/uL 284  274  253      9.  History of drug abuse.  Positive cocaine on urine drug screen.  Provide counseling  10.  AKI/hypovolemia and ATN.  resolved    Latest Ref Rng & Units 03/02/2023    7:24 AM 02/23/2023   10:37 AM 02/16/2023    7:49 AM  BMP  Creatinine 0.44 - 1.00 mg/dL 5.17  6.16  0.73     11.  Mild transaminitis with rhabdomyolysis.  Both  resolved  12.  Hypotension. Resolved, d/c midodrine  BP reviewed and is stable.  13. Impaired initiation: continue amantadine 100mg  BID  14. Hyperlycemia: CBGs mildly elevated, d/ced checks  15. Spasticity:   -discussed serial casting with OT and her mom  -  continue baclofen to 20mg  TID and tizanidine to 4mg  TID. Continue wrist, elbow brace, and PRAFOs. Ordered a night splint for LLE  Repeat CMP today given chronic tizanidine use  -9/7 continues to have significant tone despite use of tizanidine and baclofen, may be beneficial consider baclofen pump in outpatient setting if this causes significant difficulties in care  9/8 consider decrease baclofen if dyskinetic movements continue  10/5-6  continue orthotics, ROM, positioning  when possible 16. Bowel and bladder incontinence: continue bowel and bladder program  17. Left hand skin maceration: Eucerin ordered, discussed hand split with OT, conitnue  18. History of magnesium deficiency: magnesium 500mg  started HS    03/02/2023   10:16 AM 03/02/2023    6:09 AM 03/02/2023    3:05 AM  Vitals with BMI  Systolic 113 118 621  Diastolic 37 79 79  Pulse 95 81 81    19. HTN: see above Increase tizanidine to 6mg  TID. Magnesium supplement added HS. Increase propanolol to 20mg  TID, continue this dose  - 10/12: Normotensive, monitor    03/02/2023   10:16 AM 03/02/2023    6:09 AM 03/02/2023    3:05 AM  Vitals with BMI  Systolic 113 118 308  Diastolic 37 79 79  Pulse 95 81 81     20. Area of redness between left thumb and index finger: continue foam dressing  21. Scalp eczema: selsun blue ordered, continue  -9/7 patient's mother feels like this is causing her to itch, will start some hydrocortisone cream  22. Fatigue: improved, discussed with team may be secondary to anti-spasticity medications, B12 reviewed and is 335, in suboptimal range, will give 1,000U B12 injection 8/7, will order monthly as needed, metanx started, increase to  BID, continue  23. Right wrist swelling: XR ordered and shows no acute fracture, shows 2.54mm ulnar variance, brace removed to given patient a break from it. Voltaren gel ordered prn for discomfort, continue, CT ordered and show no acute osseus injury, continue ace wrap  23. Dysphagia: continue D3/thins, d/c ensure as blood pressure severely elevates whenever restarted  24. MRSA nares positive: negative on repeat, precautions d/ced  25. Tachycardic: resolved, d/c propanolol.  continueTizanidine increased to 6mg  TID. Resolved, amantadine increased back to 100mg  BID, continue this dose  26.  Dyskinetic movements in head and neck - appears to be myoclonic jerks related to hypoxic brain injury - as discussed with pt mom, no specific meds for this on antispasticity meds , consioder benzo although with pt's SUD would avoid this class  EEG reviewed and was negative for seizure.  Decrease baclofen to 15mg  TID  27. Small sore on the base of 5th metatarsal head: local wound care, continue  28. Rash on face: resolved, discussed with mother could have been secondary to added sugars in nutritional supplements  29. Sacral pressure injury: air mattress ordered, Placed nursing order to offload q1 hour while awake, continue medihoney, wound care consulted, discussed with nursing that this is improving  30. Cervical extensor weakness: tried decreasing tizanidine but spasticity appears increased and BP elevated so will increase back to 6mg  TID, continue this dose  31. Low protein stores: prosource ordered, continue  32. Intermittent lethargy: labs reviewed and are stable, discussed with team may be secondary to anti-spasticity medications  33. Blood blister to left heel: continue foam heel protectors.  34. Suboptimal vitamin D level: increase to 5,000 U daily. continue this dose  35. Hypotension: d/c propanolol, decrease baclofen to 15 mg TID  26.  Foot pain: tramadol ordered prn, improved today  27.  S/p fall: CT head reviewed and is stable   LOS: 104 days A FACE TO FACE EVALUATION WAS PERFORMED  Nicole [name] P Nicole [name] 03/02/2023, 10:49 AM

## 2023-03-02 NOTE — Progress Notes (Incomplete)
Unwitnessed fall. Patient found on floor this shift.  She apparently went over her bed rail and to the floor.  MD notified.  Head CT ordered and completed with normal results.  Family notified. Fall flow sheet filled out.  Post fall huddle and huddle sheet completed.  The patient did not have a bed alarm on due to the type of bed that she is on.  When she returned from CT video monitoring was ordered but no cameras were available.  An alarm pad was placed under patient.  Door left open.  Frequent rounding.

## 2023-03-02 NOTE — Progress Notes (Signed)
Speech Language Pathology Weekly Progress Note  Patient Details  Name: [name] MRN: [id number] Date of Birth: 09-25-1975  Beginning of progress report period: February 23, 2023 End of progress report period: March 02, 2023   Short Term Goals: Week 14: SLP Short Term Goal 1 (Week 14): Week 14 - Pt will present w/ appropriate response to functional stimuli (i.e. accept spoon, grasp basic item appropriately, response to tactile/auditory stimuli) 80% of the time w/ minA multimodal cues SLP Short Term Goal 1 - Progress (Week 14): Met SLP Short Term Goal 2 (Week 14): Week 14-Patient will utilize external aids for orientation to place and time with ModA multimodal cues SLP Short Term Goal 2 - Progress (Week 14): Met SLP Short Term Goal 3 (Week 14): Week 14 - Pt will demonstrate sustained attention to basic tasks for 2 minutes with Mod A multimodal cues for redirection. SLP Short Term Goal 3 - Progress (Week 14): Met SLP Short Term Goal 4 (Week 14): Week 14 - Pt will utilize increased vocal intensity w/ ModA verbal cues to remain 70% intelligibile at phrase level SLP Short Term Goal 4 - Progress (Week 14): Met    New Short Term Goals: Week 9: SLP Short Term Goal 1 (Week 15): Week 15 - Pt will perform verbal problem solving tasks with overall Mod A verbal and visual cues to verbalize solutions. SLP Short Term Goal 2 (Week 15): Week 15-Patient will utilize external aids for orientation to place and time with MinA multimodal cues SLP Short Term Goal 3 (Week 15): Week 15 - Pt will demonstrate sustained attention to basic tasks for 3 minutes with Mod A multimodal cues for redirection. SLP Short Term Goal 4 (Week 15): Week 15 - Pt will utilize increased vocal intensity w/ ModA verbal cues to remain 90% intelligibile at phrase level  Weekly Progress Updates: Patient continues to make gains and has met 4 of 4 STGs this reporting period. Currently, patient continues to demonstrate improved verbal  initiation and verbally responds in 90-100% of opportunities with extra time and intermittent cues. However, patient continue to utilize a low vocal intensity and increased speech rate which reduces her overall speech intelligibility to ~70% at the phrase level. Patient also demonstrates improved orientation to place, time and situation with overall Mod A multimodal cues needed for use of external aids.  Patient also demonstrates fluctuating level of attention and can be impacted by fatigue and perseverative behaviors. Patient and family education ongoing. Patient would benefit from continued skilled SLP intervention to maximize her cognitive functioning and functional communication in order to reduce caregiver burden.    Intensity: Minumum of 1-2 x/day, 30 to 90 minutes Frequency: 1 to 3 out of 7 days (QD) Duration/Length of Stay: TBD due to SNF placement Treatment/Interventions: Cognitive remediation/compensation;Internal/external aids;Speech/Language facilitation;Cueing hierarchy;Environmental controls;Therapeutic Activities;Functional tasks;Multimodal communication approach;Patient/family education;Therapeutic Exercise    Shamar Engelmann 03/02/2023, 6:34 AM

## 2023-03-02 NOTE — Plan of Care (Signed)
[facility] Tonsillectomy/Adenoidectomy, Postoperative PEDS care plan entered in error.

## 2023-03-02 NOTE — Progress Notes (Signed)
Occupational Therapy Session Note  Patient Details  Name: Nicole [name] MRN: [id number] Date of Birth: August 11, 1975  Today's Date: 03/02/2023 OT Individual Time: 1335-1405 OT Individual Time Calculation (min): 30 min   Short Term Goals: Week 9: OT Short Term Goal 1 (Week 9): Patient will change orientation of toothbrush/ hairbrush to accomodate task with min assist after set up OT Short Term Goal 2 (Week 9): Patient will spear food x 3 once utensil in hand, then bring to mouth with only contact assistance  Skilled Therapeutic Interventions/Progress Updates:    Pt greeted in TIS wc and agreeable to OT treatment session. Pt brought to therapy gym in wc and worked on sit<>stands at standing frame with mod +2 assist. Pt with great initiation to stand, but had difficulty weight shifting forward and achieving full upright. Pt tolerated 3 standing bouts for 1 minutes with rest breaks in between. Noted pt to be incontinent of urine with chair soaked. Pt returned to room and pivoted back to bed with max A of 1. Pt able to bridge hips to assist with doffing/donning pants/brief and washed in between legs with min cues. Pt left semi-reclined in bed with mom present and needs met.  Therapy Documentation Precautions:  Precautions Precautions: Fall Precaution Comments: Contact precautions & delayed processing Required Braces or Orthoses: Other Brace Other Brace: B adjustable night splints; B elbow "cosey" splints; L palm protector; R mitt Restrictions Weight Bearing Restrictions: No Pain: Pt reports pain in knee with weight bearing, rest and repositioned for pain management.    Therapy/Group: Individual Therapy  Mal Amabile 03/02/2023, 2:43 PM

## 2023-03-02 NOTE — Progress Notes (Signed)
   03/01/23 2032  What Happened  Was fall witnessed? No  Was patient injured? Unsure  Patient found on floor  Found by Staff-comment  Stated prior activity to/from bed, chair, or stretcher Corrie Dandy)  Provider Notification  Provider Name/Title Wynelle Link  Date Provider Notified 03/01/23  Time Provider Notified 2040  Method of Notification Call  Notification Reason Fall  Test performed and critical result CT HEAD  Date Critical Result Received 03/01/23  Follow Up  Family notified Yes - comment  Time family notified 2150  Additional tests Yes-comment  Adult Fall Risk Assessment  Risk Factor Category (scoring not indicated) High fall risk per protocol (document High fall risk)  Age 47  Fall History: Fall within 6 months prior to admission 0  Elimination; Bowel and/or Urine Incontinence 2  Elimination; Bowel and/or Urine Urgency/Frequency 2  Medications: includes PCA/Opiates, Anti-convulsants, Anti-hypertensives, Diuretics, Hypnotics, Laxatives, Sedatives, and Psychotropics 5  Patient Care Equipment 1  Mobility-Assistance 2  Mobility-Gait 2  Mobility-Sensory Deficit 0  Altered awareness of immediate physical environment 1  Impulsiveness 2  Lack of understanding of one's physical/cognitive limitations 4  Total Score 21  Patient Fall Risk Level High fall risk  Adult Fall Risk Interventions  Required Bundle Interventions *See Row Information* High fall risk - low, moderate, and high requirements implemented  Additional Interventions Use of appropriate toileting equipment (bedpan, BSC, etc.);Lap belt while in chair/wheelchair (Rehab only)  Screening for Fall Injury Risk (To be completed on HIGH fall risk patients) - Assessing Need for Floor Mats  Risk For Fall Injury- Criteria for Floor Mats None identified - No additional interventions needed  Will Implement Floor Mats Yes  Vitals  Temp 98.5 F (36.9 C)  Temp Source Oral  BP 108/79  MAP (mmHg) 90  BP Location Right Arm  BP  Method Automatic  Patient Position (if appropriate) Lying  Pulse Rate 93  Pulse Rate Source Monitor  Resp 16  Oxygen Therapy  SpO2 96 %  O2 Device Room Air  Pain Assessment  Pain Scale 0-10  Pain Score 0  Neurological  Neuro (WDL) X  Level of Consciousness Alert  Orientation Level Oriented to person  Cognition Follows commands  Speech Expressive aphasia  Motor Function/Sensation Assessment Grip;Motor response  R Hand Grip Weak  L Hand Grip Absent  RUE Motor Response Purposeful movement  RUE Sensation Full sensation  LUE Motor Response No movement to painful stimulus  LUE Sensation Full sensation  RLE Motor Response Purposeful movement  RLE Sensation Full sensation  LLE Motor Response Purposeful movement  LLE Sensation Full sensation  Neuro Symptoms Fatigue;Forgetful  Neuro symptoms relieved by Rest  Glasgow Coma Scale  Eye Opening 4  Best Verbal Response (NON-intubated) 4  Best Motor Response 6  Glasgow Coma Scale Score 14  Musculoskeletal  Musculoskeletal (WDL) X  Assistive Device MaxiMove  Generalized Weakness Yes  Weight Bearing Restrictions No  Musculoskeletal Details  RUE Weakness  RUE Ortho/Supportive Device Splint  RLE Ortho/Supportive Device  RLE Ortho/Supportive Device Ortho Boot  Integumentary  Integumentary (WDL) X  Skin Color Appropriate for ethnicity  Skin Condition Dry  Skin Integrity Abrasion;Erythema/redness  Abrasion Location Buttocks  Abrasion Location Orientation Bilateral  Erythema/Redness Location Buttocks  Erythema/Redness Location Orientation Bilateral  Skin Turgor Non-tenting  Pt remains A and O VS post fall monitored. Family informed. MD notified, Orders received and processed. Refer to results section for CT scan result. Safety maintained at all times.

## 2023-03-03 DIAGNOSIS — I959 Hypotension, unspecified: Secondary | ICD-10-CM

## 2023-03-03 NOTE — Progress Notes (Signed)
PROGRESS NOTE   Subjective/Complaints: Lying in bed this morning appears comfortable.  No new concerns or complaints reported.   ROS: Positives per HPI above. Denies fevers, chills, N/V, abdominal pain, SOB, chest pain, joint pain, new weakness or paraesthesias.    Objective:   CT HEAD WO CONTRAST ( )  Result Date: 03/01/2023 CLINICAL DATA:  Unwitnessed fall hit head EXAM: CT HEAD WITHOUT CONTRAST TECHNIQUE: Contiguous axial images were obtained from the base of the skull through the vertex without intravenous contrast. RADIATION DOSE REDUCTION: This exam was performed according to the departmental dose-optimization program which includes automated exposure control, adjustment of the mA and/or kV according to patient size and/or use of iterative reconstruction technique. COMPARISON:  MRI 11/24/2022 FINDINGS: Brain: No hemorrhage or intracranial mass. The ventricles are stable in size. Abnormal white matter hypodensity with interim finding of small areas of cystic encephalomalacia most evident in the bilateral periventricular white matter, left periatrial white matter and globi pallidi. Vascular: No hyperdense vessels.  No unexpected calcification Skull: Normal. Negative for fracture or focal lesion. Sinuses/Orbits: No acute finding. Other: None IMPRESSION: 1. No CT evidence for acute intracranial abnormality. No hemorrhage. 2. White matter hypodensity with interim development of small areas of cystic encephalomalacia most evident in the bilateral periventricular white matter, left periatrial white matter and globi pallidi. Findings are likely due to sequela of prior toxic/hypoxic insult as seen on the prior exams. Electronically Signed   By: Jasmine Pang M.D.   On: 03/01/2023 22:24   No results for input(s): "WBC", "HGB", "HCT", "PLT" in the last 72 hours.     Recent Labs    03/02/23 0724  CREATININE 0.59      Intake/Output  Summary (Last 24 hours) at 03/03/2023 1513 Last data filed at 03/03/2023 1300 Gross per 24 hour  Intake 760 ml  Output --  Net 760 ml       Pressure Injury 02/09/23 Heel Left Deep Tissue Pressure Injury - Purple or maroon localized area of discolored intact skin or blood-filled blister due to damage of underlying soft tissue from pressure and/or shear. Black/purple and boggy,  size of a quar (Active)  02/09/23 0543  Location: Heel  Location Orientation: Left  Staging: Deep Tissue Pressure Injury - Purple or maroon localized area of discolored intact skin or blood-filled blister due to damage of underlying soft tissue from pressure and/or shear.  Wound Description (Comments): Black/purple and boggy,  size of a quarter.  Foam dressing applied  Present on Admission: No    Physical Exam: Vital Signs Blood pressure 104/75, pulse 95, temperature 98.1 F (36.7 C), resp. rate 18, height 5\' 1"  (1.549 m), weight 46.1 kg, SpO2 96%.  Gen: no distress, lying in bed appears comfortable HEENT: oral mucosa pink and moist, NCAT Cardio: Reg rate Chest: CTAB, normal effort, normal rate of breathing Abd: soft, non-distended Ext: no edema Psych: pleasant, normal affect Abd: soft, non-distended, +BS Psych: flat affect, pressured speech, irritable mood Skin: + Scaling dried skin on bilateral heels  - 2 small excoriations, sore on the base of the 5th metatarsal head -no longer present - rash on face - no longer present - stage 2 to right  toe -no longer present  + area of redness between left thumb and index finger-now minimal, purple bruise left buttock unstageable - not examined +redness to bilateral elbows -not appreciated + Left heel stage III DTI, covered in foam dressing  Neurologic: AAOx2,  to year but not month or date. Follows most simple commands.  + Hypophonation, speech intelligability ~10%  Prior exams: MAS 1 right elbow,MAS 1 in RIght finger and wrist flexors  MAS 3 knee flexors,  and MAS 3 L>R  hip adductors  MAS 3 L elbow , MAS 3-4 in L wrist and finger flexors  + LUE WHO--- Hypersensitivity to left hand improved  Motor 0/5 strength in LUE and LLE, showing volitional movements in right lower extremity but difficulty to obtain MMT due to her pain/braces, stable 10/18  Twice daily for right upper extremity 1/5 finger flexion, otherwise no volitional movement of left upper extremity or bilateral lowers + BL UE WHO.    Musculoskeletal: reduced bilateral shoulder and elbow ROM due to increased tone , pain with passive knee extension bilaterally   .   Assessment/Plan: 1. Functional deficits which require 3+ hours per day of interdisciplinary therapy in a comprehensive inpatient rehab setting. Physiatrist is providing close team supervision and 24 hour management of active medical problems listed below. Physiatrist and rehab team continue to assess barriers to discharge/monitor patient progress toward functional and medical goals  Care Tool:  Bathing    Body parts bathed by patient: Face, Abdomen, Chest, Left upper leg, Right upper leg   Body parts bathed by helper: Right arm, Left arm, Front perineal area, Buttocks, Left lower leg, Right lower leg Body parts n/a: Right arm, Left arm, Front perineal area, Buttocks, Right upper leg, Left upper leg, Right lower leg, Left lower leg   Bathing assist Assist Level: Maximal Assistance - Patient 24 - 49%     Upper Body Dressing/Undressing Upper body dressing   What is the patient wearing?: Pull over shirt    Upper body assist Assist Level: Moderate Assistance - Patient 50 - 74%    Lower Body Dressing/Undressing Lower body dressing      What is the patient wearing?: Pants, Incontinence brief     Lower body assist Assist for lower body dressing: Total Assistance - Patient < 25%     Toileting Toileting Toileting Activity did not occur Press photographer and hygiene only): N/A (no void or bm)  Toileting  assist Assist for toileting: Dependent - Patient 0%     Transfers Chair/bed transfer  Transfers assist  Chair/bed transfer activity did not occur: Safety/medical concerns  Chair/bed transfer assist level: Dependent - Patient 0%     Locomotion Ambulation   Ambulation assist   Ambulation activity did not occur: Safety/medical concerns  Assist level: 2 helpers   Max distance: 3ft   Walk 10 feet activity   Assist  Walk 10 feet activity did not occur: Safety/medical concerns  Assist level: 2 helpers     Walk 50 feet activity   Assist Walk 50 feet with 2 turns activity did not occur: Safety/medical concerns         Walk 150 feet activity   Assist Walk 150 feet activity did not occur: Safety/medical concerns         Walk 10 feet on uneven surface  activity   Assist Walk 10 feet on uneven surfaces activity did not occur: Safety/medical concerns         Wheelchair     Assist Is  the patient using a wheelchair?: Yes Type of Wheelchair: Manual    Wheelchair assist level: Dependent - Patient 0% Max wheelchair distance: 62ft    Wheelchair 50 feet with 2 turns activity    Assist    Wheelchair 50 feet with 2 turns activity did not occur: Safety/medical concerns   Assist Level: Dependent - Patient 0%   Wheelchair 150 feet activity     Assist  Wheelchair 150 feet activity did not occur: Safety/medical concerns   Assist Level: Dependent - Patient 0%   Blood pressure 104/75, pulse 95, temperature 98.1 F (36.7 C), resp. rate 18, height 5\' 1"  (1.549 m), weight 46.1 kg, SpO2 96%.  Medical Problem List and Plan: 1. 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 Code status  DNR Continue CIR therapies including PT, OT SLP -10/19 much improved since last time I have seen her!  2.  Impaired mobility: continue Lovenox, weekly creatinine ordered             -antiplatelet therapy: N/A  3. Knee paint: Tylenol as needed, continue voltaren gel scheduled. Prescribing Home Zynex NexWave   XR reviewed and is stable, discussed with therapy  4. History of anxiety: discussed with her mom that she feels this was a big risk factor for her accident  5. Neuropsych/cognition: This patient is not capable of making decisions on her own behalf  - 10/12: Still not capable of medical decision making; BUT oriented and communicating, following directions - MUCH improved! . 6. Left buttock unstageable ulcer.  Continue Medihoney to buttock wound daily cover with foam dressing.  Changing dressing every 3 days or as needed soiling   7. Fluids/Electrolytes/Nutrition: Routine in and outs with follow-up chemistries  -Eating 100% most meals with assist and encouragement  8.  Cavitary right lower lobe pneumonia likely aspiration pneumonia/MRSA pneumonia.  Resolved    Latest Ref Rng & Units 02/05/2023   11:12 AM 01/22/2023    6:36 AM 12/20/2022   10:54 AM  CBC  WBC 4.0 - 10.5 K/uL 8.5  8.1  5.6   Hemoglobin 12.0 - 15.0 g/dL 14.7  82.9  56.2   Hematocrit 36.0 - 46.0 % 46.7  47.5  42.8   Platelets 150 - 400 K/uL 284  274  253      9.  History of drug abuse.  Positive cocaine on urine drug screen.  Provide counseling  10.  AKI/hypovolemia and ATN.  Resolved Appears to have fair fluid intake, creatinine has been stable    Latest Ref Rng & Units 03/02/2023    7:24 AM 02/23/2023   10:37 AM 02/16/2023    7:49 AM  BMP  Creatinine 0.44 - 1.00 mg/dL 1.30  8.65  7.84     11.   Mild transaminitis with rhabdomyolysis.  Both resolved  12.  Hypotension. Resolved, d/c midodrine  -BP stable    13. Impaired initiation: continue amantadine 100mg  BID  14. Hyperlycemia: CBGs mildly elevated, d/ced checks  15. Spasticity:   -discussed serial casting with OT and her mom  - continue baclofen to 20mg  TID and tizanidine to 4mg  TID. Continue wrist, elbow brace, and PRAFOs. Ordered a night splint for LLE  Repeat CMP today given chronic tizanidine use  -9/7 continues to have significant tone despite use of tizanidine and baclofen, may be beneficial consider baclofen pump in outpatient setting if this causes significant difficulties in care  9/8 consider decrease baclofen if dyskinetic movements continue  10/5-6  continue orthotics, ROM, positioning  when possible 16. Bowel and bladder incontinence: continue bowel and bladder program  17. Left hand skin maceration: Eucerin ordered, discussed hand split with OT, conitnue  18. History of magnesium deficiency: magnesium 500mg  started HS  19. HTN: see above Increase tizanidine to 6mg  TID. Magnesium supplement added HS. Increase propanolol to 20mg  TID, continue this dose  -10/19 BP and heart rate stable    03/03/2023    2:13 PM 03/03/2023   10:38 AM 03/03/2023    4:24 AM  Vitals with BMI  Systolic 104 111 161  Diastolic 75 69 85  Pulse 95 98 78     20. Area of redness between left thumb and index finger: continue foam dressing  21. Scalp eczema: selsun blue ordered, continue  -9/7 patient's mother feels like this is causing her to itch, will start some hydrocortisone cream  22. Fatigue: improved, discussed with team may be secondary to anti-spasticity medications, B12 reviewed and is 335, in suboptimal range, will give 1,000U B12 injection 8/7, will order monthly as needed, metanx started, increase to BID, continue  23. Right wrist swelling: XR ordered and shows no acute fracture, shows 2.33mm ulnar variance, brace  removed to given patient a break from it. Voltaren gel ordered prn for discomfort, continue, CT ordered and show no acute osseus injury, continue ace wrap  23. Dysphagia: continue D3/thins, d/c ensure as blood pressure severely elevates whenever restarted  24. MRSA nares positive: negative on repeat, precautions d/ced  25. Tachycardic: resolved, d/c propanolol.  continueTizanidine increased to 6mg  TID. Resolved, amantadine increased back to 100mg  BID, continue this dose  26.  Dyskinetic movements in head and neck - appears to be myoclonic jerks related to hypoxic brain injury - as discussed with pt mom, no specific meds for this on antispasticity meds , consioder benzo although with pt's SUD would avoid this class  EEG reviewed and was negative for seizure.  Decrease baclofen to 15mg  TID  27. Small sore on the base of 5th metatarsal head: local wound care, continue  28. Rash on face: resolved, discussed with mother could have been secondary to added sugars in nutritional supplements  29. Sacral pressure injury: air mattress ordered, Placed nursing order to offload q1 hour while awake, continue medihoney, wound care consulted, discussed with nursing that this is improving  30. Cervical extensor weakness: tried decreasing tizanidine but spasticity appears increased and BP elevated so will increase back to 6mg  TID, continue this dose  31. Low protein stores: prosource ordered, continue  32. Intermittent lethargy: labs reviewed and are stable, discussed with team may be secondary to anti-spasticity medications  33. Blood blister to left heel: continue foam heel protectors.  34. Suboptimal vitamin D level: increase to 5,000 U daily. continue this dose  35. Hypotension: d/c propanolol, decrease baclofen to 15 mg TID  26. Foot pain: tramadol ordered prn, improved today  27. S/p fall: CT head reviewed and is stable   LOS: 105 days A FACE TO  FACE EVALUATION WAS PERFORMED  Fanny Dance 03/03/2023, 3:13 PM

## 2023-03-04 DIAGNOSIS — G4700 Insomnia, unspecified: Secondary | ICD-10-CM

## 2023-03-04 MED ORDER — MELATONIN 3 MG PO TABS
3.0000 mg | ORAL_TABLET | Freq: Every evening | ORAL | Status: DC | PRN
  Administered 2023-03-04 – 2023-06-04 (×55): 3 mg via ORAL
  Filled 2023-03-04 (×57): qty 1

## 2023-03-04 NOTE — Progress Notes (Addendum)
PROGRESS NOTE   Subjective/Complaints: Eating lunch today.  No concerns elicited.  Patient was noted to have some spasms in lower extremities at night and poor sleep.   ROS: Positives per HPI above. Denies fevers, chills, N/V, abdominal pain, SOB, chest pain, joint pain, new weakness or paraesthesias.    Objective:   No results found. No results for input(s): "WBC", "HGB", "HCT", "PLT" in the last 72 hours.     Recent Labs    03/02/23 0724  CREATININE 0.59      Intake/Output Summary (Last 24 hours) at 03/04/2023 1525 Last data filed at 03/04/2023 0825 Gross per 24 hour  Intake 580 ml  Output --  Net 580 ml       Pressure Injury 02/09/23 Heel Left Deep Tissue Pressure Injury - Purple or maroon localized area of discolored intact skin or blood-filled blister due to damage of underlying soft tissue from pressure and/or shear. Black/purple and boggy,  size of a quar (Active)  02/09/23 0543  Location: Heel  Location Orientation: Left  Staging: Deep Tissue Pressure Injury - Purple or maroon localized area of discolored intact skin or blood-filled blister due to damage of underlying soft tissue from pressure and/or shear.  Wound Description (Comments): Black/purple and boggy,  size of a quarter.  Foam dressing applied  Present on Admission: No    Physical Exam: Vital Signs Blood pressure 105/80, pulse (!) 102, temperature 98.2 F (36.8 C), resp. rate 18, height 5\' 1"  (1.549 m), weight 46.1 kg, SpO2 98%.  Gen: no distress, lying in bed eating with assistance of nursing HEENT: oral mucosa pink and moist, NCAT Cardio: Reg rate Chest: CTAB, normal effort, normal rate of breathing Abd: soft, non-distended Ext: no edema Psych: pleasant, normal affect Abd: soft, non-distended, +BS Psych: flat affect, pressured speech, irritable mood Skin: + Scaling dried skin on bilateral heels  - 2 small excoriations, sore on the  base of the 5th metatarsal head -no longer present - rash on face - no longer present - stage 2 to right toe -no longer present  + area of redness between left thumb and index finger-now minimal, purple bruise left buttock unstageable - not examined +redness to bilateral elbows -not appreciated + Left heel stage III DTI, covered in foam dressing  Neurologic: AAOx2,  to year but not month or date. Follows most simple commands.  + Hypophonation, speech intelligability ~10%  Prior exams: MAS 1 right elbow,MAS 1 in RIght finger and wrist flexors  MAS 3 knee flexors, and MAS 3 L>R  hip adductors  MAS 3 L elbow , MAS 3-4 in L wrist and finger flexors  + LUE WHO--- Hypersensitivity to left hand improved  Motor 0/5 strength in LUE and LLE, showing volitional movements in right lower extremity but difficulty to obtain MMT due to her pain/braces, stable 10/18  Twice daily for right upper extremity 1/5 finger flexion, otherwise no volitional movement of left upper extremity or bilateral lowers + BL UE WHO.    Musculoskeletal: reduced bilateral shoulder and elbow ROM due to increased tone , pain with passive knee extension bilaterally   .   Assessment/Plan: 1. Functional deficits which require 3+ hours  per day of interdisciplinary therapy in a comprehensive inpatient rehab setting. Physiatrist is providing close team supervision and 24 hour management of active medical problems listed below. Physiatrist and rehab team continue to assess barriers to discharge/monitor patient progress toward functional and medical goals  Care Tool:  Bathing    Body parts bathed by patient: Face, Abdomen, Chest, Left upper leg, Right upper leg   Body parts bathed by helper: Right arm, Left arm, Front perineal area, Buttocks, Left lower leg, Right lower leg Body parts n/a: Right arm, Left arm, Front perineal area, Buttocks, Right upper leg, Left upper leg, Right lower leg, Left lower leg   Bathing assist  Assist Level: Maximal Assistance - Patient 24 - 49%     Upper Body Dressing/Undressing Upper body dressing   What is the patient wearing?: Pull over shirt    Upper body assist Assist Level: Moderate Assistance - Patient 50 - 74%    Lower Body Dressing/Undressing Lower body dressing      What is the patient wearing?: Pants, Incontinence brief     Lower body assist Assist for lower body dressing: Total Assistance - Patient < 25%     Toileting Toileting Toileting Activity did not occur Press photographer and hygiene only): N/A (no void or bm)  Toileting assist Assist for toileting: Dependent - Patient 0%     Transfers Chair/bed transfer  Transfers assist  Chair/bed transfer activity did not occur: Safety/medical concerns  Chair/bed transfer assist level: Dependent - Patient 0%     Locomotion Ambulation   Ambulation assist   Ambulation activity did not occur: Safety/medical concerns  Assist level: 2 helpers   Max distance: 54ft   Walk 10 feet activity   Assist  Walk 10 feet activity did not occur: Safety/medical concerns  Assist level: 2 helpers     Walk 50 feet activity   Assist Walk 50 feet with 2 turns activity did not occur: Safety/medical concerns         Walk 150 feet activity   Assist Walk 150 feet activity did not occur: Safety/medical concerns         Walk 10 feet on uneven surface  activity   Assist Walk 10 feet on uneven surfaces activity did not occur: Safety/medical concerns         Wheelchair     Assist Is the patient using a wheelchair?: Yes Type of Wheelchair: Manual    Wheelchair assist level: Dependent - Patient 0% Max wheelchair distance: 54ft    Wheelchair 50 feet with 2 turns activity    Assist    Wheelchair 50 feet with 2 turns activity did not occur: Safety/medical concerns   Assist Level: Dependent - Patient 0%   Wheelchair 150 feet activity     Assist  Wheelchair 150 feet activity  did not occur: Safety/medical concerns   Assist Level: Dependent - Patient 0%   Blood pressure 105/80, pulse (!) 102, temperature 98.2 F (36.8 C), resp. rate 18, height 5\' 1"  (1.549 m), weight 46.1 kg, SpO2 98%.  Medical Problem List and Plan: 1. 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 Code status  DNR Continue CIR therapies including PT, OT SLP   2.  Impaired mobility: continue Lovenox, weekly creatinine ordered             -antiplatelet therapy: N/A  3. Knee paint: Tylenol as needed, continue voltaren gel scheduled. Prescribing Home Zynex NexWave   XR reviewed and is stable, discussed with therapy  4. History of anxiety: discussed with her mom that she feels this was a big risk factor for her accident  5. Neuropsych/cognition: This patient is not capable of making decisions on her own behalf  - 10/12: Still not capable of medical decision making; BUT oriented and communicating, following directions - MUCH improved .Will add PRN melatonin 3mg  10/20 for insomnia 6. Left buttock unstageable ulcer.  Continue Medihoney to buttock wound daily cover with foam dressing.  Changing dressing every 3 days or as needed soiling   7. Fluids/Electrolytes/Nutrition: Routine in and outs with follow-up chemistries  -Eating 100% most meals with assist and encouragement  8.  Cavitary right lower lobe pneumonia likely aspiration pneumonia/MRSA pneumonia.  Resolved    Latest Ref Rng & Units 02/05/2023   11:12 AM  01/22/2023    6:36 AM 12/20/2022   10:54 AM  CBC  WBC 4.0 - 10.5 K/uL 8.5  8.1  5.6   Hemoglobin 12.0 - 15.0 g/dL 28.4  13.2  44.0   Hematocrit 36.0 - 46.0 % 46.7  47.5  42.8   Platelets 150 - 400 K/uL 284  274  253      9.  History of drug abuse.  Positive cocaine on urine drug screen.  Provide counseling  10.  AKI/hypovolemia and ATN.  Resolved Appears to have fair fluid intake, creatinine has been stable    Latest Ref Rng & Units 03/02/2023    7:24 AM 02/23/2023   10:37 AM 02/16/2023    7:49 AM  BMP  Creatinine 0.44 - 1.00 mg/dL 1.02  7.25  3.66     11.  Mild transaminitis with rhabdomyolysis.  Both resolved  12.  Hypotension. Resolved, d/c midodrine  -BP stable    13. Impaired initiation: continue amantadine 100mg  BID  14. Hyperlycemia: CBGs mildly elevated, d/ced checks  15. Spasticity:   -discussed serial casting with OT and her mom  - continue baclofen to 20mg  TID and tizanidine to 4mg  TID. Continue wrist, elbow brace, and PRAFOs. Ordered a night splint for LLE  Repeat CMP today given chronic tizanidine use  -9/7 continues to have significant tone despite use of tizanidine and baclofen, may be beneficial consider baclofen pump in outpatient setting if this causes significant difficulties in care  9/8 consider decrease baclofen if dyskinetic movements continue  10/5-6  continue orthotics, ROM, positioning  when possible 16. Bowel and bladder incontinence: continue bowel and bladder program  17. Left hand skin maceration: Eucerin ordered, discussed hand split with OT, conitnue  18. History of magnesium deficiency: magnesium 500mg  started HS  19. HTN: see above Increase tizanidine to 6mg  TID. Magnesium supplement added HS. Increase propanolol to 20mg  TID, continue this dose  -10/ 20 BP stable, continue current regimen    03/04/2023    3:16 PM 03/04/2023    3:09 AM 03/03/2023   11:17 PM  Vitals with BMI  Systolic 105 107 440  Diastolic 80 70 81  Pulse 102 69 96  20. Area of redness between left thumb and index finger: continue foam dressing  21. Scalp eczema: selsun blue ordered, continue  -9/7 patient's mother feels like this is causing her to itch, will start some hydrocortisone cream  22. Fatigue: improved, discussed with team may be secondary to anti-spasticity medications, B12 reviewed and is 335, in suboptimal range, will give 1,000U B12 injection 8/7, will order monthly as needed, metanx started, increase to BID, continue  23. Right wrist swelling: XR ordered and shows no acute fracture, shows 2.35mm ulnar variance, brace removed to given patient a break from it. Voltaren gel ordered prn for discomfort, continue, CT ordered and show no acute osseus injury, continue ace wrap  23. Dysphagia: continue D3/thins, d/c ensure as blood pressure severely elevates whenever restarted  24. MRSA nares positive: negative on repeat, precautions d/ced  25. Tachycardic: resolved, d/c propanolol.  continueTizanidine increased to 6mg  TID. Resolved, amantadine increased back to 100mg  BID, continue this dose  26.  Dyskinetic movements in head and neck - appears to be myoclonic jerks related to hypoxic brain injury - as discussed with pt mom, no specific meds for this on antispasticity meds , consioder benzo although with pt's SUD would avoid this class  EEG reviewed and was negative for seizure.  Decrease baclofen to 15mg  TID  27. Small sore on the base of 5th metatarsal head: local wound care, continue  28. Rash on face: resolved, discussed with mother could have been secondary to added sugars in nutritional supplements  29. Sacral pressure injury: air mattress ordered, Placed nursing order to offload q1 hour while awake, continue medihoney, wound care consulted, discussed with nursing that this is improving  30. Cervical extensor weakness: tried decreasing tizanidine but spasticity appears increased and BP elevated so will increase back to 6mg  TID,  continue this dose  31. Low protein stores: prosource ordered, continue  32. Intermittent lethargy: labs reviewed and are stable, discussed with team may be secondary to anti-spasticity medications  33. Blood blister to left heel: continue foam heel protectors.  34. Suboptimal vitamin D level: increase to 5,000 U daily. continue this dose  35. Hypotension: d/c propanolol, decrease baclofen to 15 mg TID  26. Foot pain: tramadol ordered prn, improved today  27. S/p fall: CT head reviewed and is stable   LOS: 106 days A FACE TO FACE EVALUATION WAS PERFORMED  Fanny Dance 03/04/2023, 3:25 PM

## 2023-03-04 NOTE — Progress Notes (Signed)
Patient slept approximately 1 hour this shift. Patient appears restless in bed requiring frequent repositioning. PRN pain meds given with some relief noted. Intermittent BLE spasms persist. Patient tolerated BLE braces about 4 hours.

## 2023-03-05 MED ORDER — PROPRANOLOL HCL 20 MG PO TABS
10.0000 mg | ORAL_TABLET | Freq: Every day | ORAL | Status: DC
  Administered 2023-03-05 – 2023-03-06 (×2): 10 mg via ORAL
  Filled 2023-03-05 (×2): qty 1

## 2023-03-05 NOTE — Progress Notes (Signed)
Patient ID: Nicole [name], female   DOB: [birth year], 47 y.o.   MRN: [id number] Continue to contact and leave a message for [name] Robinson-pt's Medicaid worker regarding the status of her application for long term care medicaid. Await return call.

## 2023-03-05 NOTE — Plan of Care (Signed)
  Problem: Consults Goal: RH STROKE PATIENT EDUCATION Description: See Patient Education module for education specifics  Outcome: Progressing   Problem: RH BOWEL ELIMINATION Goal: RH STG MANAGE BOWEL WITH ASSISTANCE Description: STG Manage Bowel with mod I Assistance. Outcome: Progressing Goal: RH STG MANAGE BOWEL W/MEDICATION W/ASSISTANCE Description: STG Manage Bowel with Medication with mod I Assistance. Outcome: Progressing   Problem: RH BLADDER ELIMINATION Goal: RH STG MANAGE BLADDER WITH ASSISTANCE Description: STG Manage Bladder With toileting Assistance Outcome: Progressing   Problem: RH SKIN INTEGRITY Goal: RH STG MAINTAIN SKIN INTEGRITY WITH ASSISTANCE Description: STG Maintain Skin Integrity With min  Assistance. Outcome: Progressing

## 2023-03-05 NOTE — Progress Notes (Signed)
PROGRESS NOTE   Subjective/Complaints: No issues overnight No new complaints this morning DBP elevated to 190- will restart propanolol 10mg  daily   ROS: Positives per HPI above. Denies fevers, chills, N/V, abdominal pain, SOB, chest pain, joint pain, new weakness or paraesthesias.    Objective:   No results found. No results for input(s): "WBC", "HGB", "HCT", "PLT" in the last 72 hours.     No results for input(s): "NA", "K", "CL", "CO2", "GLUCOSE", "BUN", "CREATININE", "CALCIUM" in the last 72 hours.     Intake/Output Summary (Last 24 hours) at 03/05/2023 1050 Last data filed at 03/05/2023 0855 Gross per 24 hour  Intake 600 ml  Output --  Net 600 ml       Pressure Injury 02/09/23 Heel Left Deep Tissue Pressure Injury - Purple or maroon localized area of discolored intact skin or blood-filled blister due to damage of underlying soft tissue from pressure and/or shear. Black/purple and boggy,  size of a quar (Active)  02/09/23 0543  Location: Heel  Location Orientation: Left  Staging: Deep Tissue Pressure Injury - Purple or maroon localized area of discolored intact skin or blood-filled blister due to damage of underlying soft tissue from pressure and/or shear.  Wound Description (Comments): Black/purple and boggy,  size of a quarter.  Foam dressing applied  Present on Admission: No    Physical Exam: Vital Signs Blood pressure (!) 114/90, pulse 87, temperature 98 F (36.7 C), temperature source Oral, resp. rate 18, height 5\' 1"  (1.549 m), weight 46.1 kg, SpO2 96%.  Gen: no distress, lying in bed eating with assistance of nursing HEENT: oral mucosa pink and moist, NCAT Cardio: Reg rate Chest: CTAB, normal effort, normal rate of breathing Abd: soft, non-distended Ext: no edema Psych: pleasant, normal affect Abd: soft, non-distended, +BS Psych: flat affect, pressured speech, irritable mood Skin: + Scaling  dried skin on bilateral heels  - 2 small excoriations, sore on the base of the 5th metatarsal head -no longer present - rash on face - no longer present - stage 2 to right toe -no longer present  + area of redness between left thumb and index finger-now minimal, purple bruise left buttock unstageable - not examined +redness to bilateral elbows -not appreciated + Left heel stage III DTI, covered in foam dressing  Neurologic: AAOx2,  to year but not month or date. Follows most simple commands.  + Hypophonation, speech intelligability ~10%  Prior exams: MAS 1 right elbow,MAS 1 in RIght finger and wrist flexors  MAS 3 knee flexors, and MAS 3 L>R  hip adductors  MAS 3 L elbow , MAS 3-4 in L wrist and finger flexors  + LUE WHO--- Hypersensitivity to left hand improved  Motor 0/5 strength in LUE and LLE, showing volitional movements in right lower extremity but difficulty to obtain MMT due to her pain/braces, stable 10/21  Twice daily for right upper extremity 1/5 finger flexion, otherwise no volitional movement of left upper extremity or bilateral lowers + BL UE WHO.    Musculoskeletal: reduced bilateral shoulder and elbow ROM due to increased tone , pain with passive knee extension bilaterally   .   Assessment/Plan: 1. Functional deficits which  require 3+ hours per day of interdisciplinary therapy in a comprehensive inpatient rehab setting. Physiatrist is providing close team supervision and 24 hour management of active medical problems listed below. Physiatrist and rehab team continue to assess barriers to discharge/monitor patient progress toward functional and medical goals  Care Tool:  Bathing    Body parts bathed by patient: Face, Abdomen, Chest, Left upper leg, Right upper leg   Body parts bathed by helper: Right arm, Left arm, Front perineal area, Buttocks, Left lower leg, Right lower leg Body parts n/a: Right arm, Left arm, Front perineal area, Buttocks, Right upper leg,  Left upper leg, Right lower leg, Left lower leg   Bathing assist Assist Level: Maximal Assistance - Patient 24 - 49%     Upper Body Dressing/Undressing Upper body dressing   What is the patient wearing?: Pull over shirt    Upper body assist Assist Level: Moderate Assistance - Patient 50 - 74%    Lower Body Dressing/Undressing Lower body dressing      What is the patient wearing?: Pants, Incontinence brief     Lower body assist Assist for lower body dressing: Total Assistance - Patient < 25%     Toileting Toileting Toileting Activity did not occur Press photographer and hygiene only): N/A (no void or bm)  Toileting assist Assist for toileting: Dependent - Patient 0%     Transfers Chair/bed transfer  Transfers assist  Chair/bed transfer activity did not occur: Safety/medical concerns  Chair/bed transfer assist level: Dependent - Patient 0%     Locomotion Ambulation   Ambulation assist   Ambulation activity did not occur: Safety/medical concerns  Assist level: 2 helpers   Max distance: 60ft   Walk 10 feet activity   Assist  Walk 10 feet activity did not occur: Safety/medical concerns  Assist level: 2 helpers     Walk 50 feet activity   Assist Walk 50 feet with 2 turns activity did not occur: Safety/medical concerns         Walk 150 feet activity   Assist Walk 150 feet activity did not occur: Safety/medical concerns         Walk 10 feet on uneven surface  activity   Assist Walk 10 feet on uneven surfaces activity did not occur: Safety/medical concerns         Wheelchair     Assist Is the patient using a wheelchair?: Yes Type of Wheelchair: Manual    Wheelchair assist level: Dependent - Patient 0% Max wheelchair distance: 90ft    Wheelchair 50 feet with 2 turns activity    Assist    Wheelchair 50 feet with 2 turns activity did not occur: Safety/medical concerns   Assist Level: Dependent - Patient 0%    Wheelchair 150 feet activity     Assist  Wheelchair 150 feet activity did not occur: Safety/medical concerns   Assist Level: Dependent - Patient 0%   Blood pressure (!) 114/90, pulse 87, temperature 98 F (36.7 C), temperature source Oral, resp. rate 18, height 5\' 1"  (1.549 m), weight 46.1 kg, SpO2 96%.  Medical Problem List and Plan: 1. 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 Code status  DNR Continue CIR therapies including PT, OT SLP   2.  Impaired mobility: continue Lovenox, weekly creatinine ordered             -antiplatelet therapy: N/A  3. Knee paint: Tylenol as needed, continue voltaren gel scheduled. Prescribing Home Zynex NexWave   XR reviewed and is stable, discussed with therapy  4. History of anxiety: discussed with her mom that she feels this was a big risk factor for her accident  5. Neuropsych/cognition: This patient is not capable of making decisions on her own behalf  - 10/12: Still not capable of medical decision making; BUT oriented and communicating, following directions - MUCH improved .Will add PRN melatonin 3mg  10/20 for insomnia 6. Left buttock unstageable ulcer.  Continue Medihoney to buttock wound daily cover with foam dressing.  Changing dressing every 3 days or as needed soiling   7. Fluids/Electrolytes/Nutrition: Routine in and outs with follow-up chemistries  -Eating 100% most meals with assist and encouragement  8.  Cavitary right lower lobe pneumonia  likely aspiration pneumonia/MRSA pneumonia.  Resolved    Latest Ref Rng & Units 02/05/2023   11:12 AM 01/22/2023    6:36 AM 12/20/2022   10:54 AM  CBC  WBC 4.0 - 10.5 K/uL 8.5  8.1  5.6   Hemoglobin 12.0 - 15.0 g/dL 73.2  20.2  54.2   Hematocrit 36.0 - 46.0 % 46.7  47.5  42.8   Platelets 150 - 400 K/uL 284  274  253      9.  History of drug abuse.  Positive cocaine on urine drug screen.  Provide counseling  10.  AKI/hypovolemia and ATN.  Resolved Appears to have fair fluid intake, creatinine has been stable    Latest Ref Rng & Units 03/02/2023    7:24 AM 02/23/2023   10:37 AM 02/16/2023    7:49 AM  BMP  Creatinine 0.44 - 1.00 mg/dL 7.06  2.37  6.28     11.  Mild transaminitis with rhabdomyolysis.  Both resolved  12.  Hypotension. Resolved, d/c midodrine  -BP stable    13. Impaired initiation: continue amantadine 100mg  BID  14. Hyperlycemia: CBGs mildly elevated, d/ced checks  15. Spasticity:   -discussed serial casting with OT and her mom  - continue baclofen to 20mg  TID and tizanidine to 4mg  TID. Continue wrist, elbow brace, and PRAFOs. Ordered a night splint for LLE  Repeat CMP today given chronic tizanidine use  -9/7 continues to have significant tone despite use of tizanidine and baclofen, may be beneficial consider baclofen pump in outpatient setting if this causes significant difficulties in care  9/8 consider decrease baclofen if dyskinetic movements continue  10/5-6  continue orthotics, ROM, positioning  when possible 16. Bowel and bladder incontinence: continue bowel and bladder program  17. Left hand skin maceration: Eucerin ordered, discussed hand split with OT, conitnue  18. History of magnesium deficiency: magnesium 500mg  started HS  19. HTN: see above Increase tizanidine to 6mg  TID. Magnesium supplement added HS. Increase propanolol to 20mg  TID, continue this dose  -10/ 20 BP stable, continue current regimen    03/05/2023    9:50 AM 03/05/2023    4:18 AM  03/04/2023    7:28 PM  Vitals with BMI  Systolic 114 121 315  Diastolic 90 86 77  Pulse 87  88 98     20. Area of redness between left thumb and index finger: continue foam dressing  21. Scalp eczema: selsun blue ordered, continue  -9/7 patient's mother feels like this is causing her to itch, will start some hydrocortisone cream  22. Fatigue: improved, discussed with team may be secondary to anti-spasticity medications, B12 reviewed and is 335, in suboptimal range, will give 1,000U B12 injection 8/7, will order monthly as needed, metanx started, increase to BID, continue  23. Right wrist swelling: XR ordered and shows no acute fracture, shows 2.32mm ulnar variance, brace removed to given patient a break from it. Voltaren gel ordered prn for discomfort, continue, CT ordered and show no acute osseus injury, continue ace wrap  23. Dysphagia: continue D3/thins, d/c ensure as blood pressure severely elevates whenever restarted  24. MRSA nares positive: negative on repeat, precautions d/ced  25. Tachycardic: resolved, d/c propanolol.  continueTizanidine increased to 6mg  TID. Resolved, amantadine increased back to 100mg  BID, continue this dose  26.  Dyskinetic movements in head and neck - appears to be myoclonic jerks related to hypoxic brain injury - as discussed with pt mom, no specific meds for this on antispasticity meds , consioder benzo although with pt's SUD would avoid this class  EEG reviewed and was negative for seizure.  Decrease baclofen to 15mg  TID  27. Small sore on the base of 5th metatarsal head: local wound care, continue  28. Rash on face: resolved, discussed with mother could have been secondary to added sugars in nutritional supplements  29. Sacral pressure injury: air mattress ordered, Placed nursing order to offload q1 hour while awake, continue medihoney, wound care consulted, discussed with nursing that this is improving  30. Cervical extensor weakness: tried  decreasing tizanidine but spasticity appears increased and BP elevated so will increase back to 6mg  TID, continue this dose  31. Low protein stores: prosource ordered, continue  32. Intermittent lethargy: labs reviewed and are stable, discussed with team may be secondary to anti-spasticity medications  33. Blood blister to left heel: continue foam heel protectors.  34. Suboptimal vitamin D level: increase to 5,000 U daily. continue this dose  35. Hypotension: resolved, add back propanolol 10mg  daily. decrease baclofen to 15 mg TID  26. Foot pain: tramadol ordered prn, improved today, continue prn  27. S/p fall: CT head reviewed and is stable   LOS: 107 days A FACE TO FACE EVALUATION WAS PERFORMED  Kristeen Lantz P Trevone Prestwood 03/05/2023, 10:50 AM

## 2023-03-05 NOTE — Progress Notes (Signed)
Physical Therapy Session Note  Patient Details  Name: Nicole [name] MRN: [id number] Date of Birth: 04/16/1976  Today's Date: 03/05/2023 PT Individual Time: 1130-1200 PT Individual Time Calculation (min): 30 min   Short Term Goals: Week 2:  PT Short Term Goal 1 (Week 2): Week 14; pt will complete bed mobility with totalA (pt <25%) PT Short Term Goal 2 (Week 2): Week 14: pt will maintain unsupported sitting balance for up to 1 minute consistently with CGA PT Short Term Goal 3 (Week 2): Week 14: Pt will complete bed<>chair transfers with totalA (pt <25%) PT Short Term Goal 4 (Week 2): Pt will be assessed for custom wheelchair needs  Skilled Therapeutic Interventions/Progress Updates:      Pt in wheelchair to start - agreeable to therapy session. Transported patient to main rehab gym and setup outside // bars to work on sit<>stands and standing tolerance.   Used sheet method and foam knee block with +2 assist to attempt standing with max/totalA. Patient unable to tolerate full standing and even grimmaces/curses when she's positioned in 1/2 standing. Unable to rate or describe pain, but seems to be related to her L knee vs tight heel cords in weight bearing. We were able to complete x3 stands total with similar assist level - she is beginning to show more initiation towards movement and volitional movement patterns. Verbalizations are also becoming more understandable.   Pt returned to her room - left reclined in TIS w/c chest strap for added safety. All needs met.    Therapy Documentation Precautions:  Precautions Precautions: Fall Precaution Comments: Contact precautions & delayed processing Required Braces or Orthoses: Other Brace Other Brace: B adjustable night splints; B elbow "cosey" splints; L palm protector; R mitt Restrictions Weight Bearing Restrictions: No General:      Therapy/Group: Individual Therapy  Orrin Brigham 03/05/2023, 7:50 AM

## 2023-03-05 NOTE — Progress Notes (Signed)
Patient remains restless throughout shift. PRM melatonin given. Patient grimacing and spasms noted during turning/cleaning. PRN tramadol given with some relief at times.  Attempted BLE boots off and on. Spasms appear worse with boots on and patient with difficulty getting comfortable.

## 2023-03-05 NOTE — Progress Notes (Signed)
Occupational Therapy Weekly Progress Note  Patient Details  Name: Nicole [name] MRN: [id number] Date of Birth: 11/14/1975  Today's Date: 03/05/2023 OT Individual Time: 8413-2440 OT Individual Time Calculation (min): 30 min    Short Term Goals: Week 14:  OT Short Term Goal 1 (Week 14): Patient will reach toward lower leg and foot with min assist using RUE in prep for lower body OT Short Term Goal 1 - Progress (Week 14): Progressing toward goal OT Short Term Goal 2 (Week 14): Patient will utilize LUE for part of one functional task with min assist OT Short Term Goal 2 - Progress (Week 14): Progressing toward goal  Week 15:  OT Short Term Goal 1 (Week 15): Patient will attempt to use BLE to assist with scooting or early aspect of transfer OT Short Term Goal 2 (Week 15): Patient will use right hand to pull pants over hips to aide with dressing OT Short Term Goal 3 (Week 15): Patient will feed herself 100% of meal following set up with intermittent min assist OT Short Term Goal 4 (Week 15): Patient will complete oral care after set up with min cueing   Skilled Therapeutic Interventions/Progress Updates:    Patient continues to make functional improvements due to improved attention, interaction/ participation.  Limited by severe left hemiplegia, and spasticity.    Patient received rapidly feeding herself breakfast using built up foam on fork.  Patient semi-reclined in bed.  Patient needed to have fork removed from her hand to slow rate of speed.  Patient encouraged to drink fluids, and clear mouth before adding more food.  Encouraged patient to get up out of bed to finish breakfast, and patient very clearly declined.  Patient assisted to dress lower body after changing wet brief.  Patient able to hold pants in right hand and pull up over right leg, even bridging to pull up over hips.  When assisted left knee to bend - able to fully bridge to aide with LB dressing.  Patient with STRONG posterior lean  in sitting today.  Total assist transfer to wheelchair, but then able to wash face and complete oral care after set up at sink.   Left up in wheelchair with safety belt in place and engaged and call bell in lap..   Therapy Documentation Precautions:  Precautions Precautions: Fall Precaution Comments: Contact precautions & delayed processing Required Braces or Orthoses: Other Brace Other Brace: B adjustable night splints; B elbow "cosey" splints; L palm protector; R mitt Restrictions Weight Bearing Restrictions: No  Pain: Pain Assessment Pain Scale: 0-10 Pain Score: 0-No pain    Therapy/Group: Individual Therapy  [name] 03/05/2023, 12:15 PM

## 2023-03-05 NOTE — Progress Notes (Signed)
Speech Language Pathology Daily Session Note  Patient Details  Name: Nicole [name] MRN: [id number] Date of Birth: [birth year]  Today's Date: 03/05/2023 SLP Individual Time: 1345-1430 SLP Individual Time Calculation (min): 45 min  Short Term Goals: Week 9: SLP Short Term Goal 1 (Week 9): Week 15 - Pt will perform verbal problem solving tasks with overall Mod A verbal and visual cues to verbalize solutions. SLP Short Term Goal 2 (Week 9): Week 15-Patient will utilize external aids for orientation to place and time with MinA multimodal cues SLP Short Term Goal 3 (Week 9): Week 15 - Pt will demonstrate sustained attention to basic tasks for 3 minutes with Mod A multimodal cues for redirection. SLP Short Term Goal 4 (Week 9): Week 15 - Pt will utilize increased vocal intensity w/ ModA verbal cues to remain 90% intelligibile at phrase level  Skilled Therapeutic Interventions:  Patient was seen in PM to address cognitive re- training. Pt was alert and seated upright in WC upon SLP arrival. She c/o pain in her left leg with pt intermiteently yelling out and/ or jerking in response to pain. NSG in and out during session administering pain meds with no other visible indicators of pain across session. Given an eternal visual aid, pt identified temporal concepts with mod cues. She was indep oriented to spatial concepts. Given visual stimulus of months, pt was challenged to correlate holidays to the correct months with mod to max A. In additional minutes of session, SLP engaged pt in a discussion of a preferred topic, NBA players and teams. She named 5 NBA players with sup A and 3 additional players with mod A. During a guided discussion, pt with 50% speech intelligibility indep with mod A for amplifying voice during session to achieve 70% intelligibility at phrase level. Pt was left seated upright in WC with call button within reach and chair alarm active. SLP to continue POC.   Pain Pain Assessment Pain Scale:  0-10 Pain Score: 0-No pain Faces Pain Scale: Hurts whole lot Pain Type: Acute pain Pain Location: Leg Pain Orientation: Left;Right Pain Descriptors / Indicators: Grimacing;Spasm  Therapy/Group: Individual Therapy  [name] 03/05/2023, 4:11 PM

## 2023-03-06 MED ORDER — PROPRANOLOL HCL 20 MG PO TABS
10.0000 mg | ORAL_TABLET | Freq: Two times a day (BID) | ORAL | Status: DC
  Administered 2023-03-06 – 2023-03-09 (×6): 10 mg via ORAL
  Filled 2023-03-06 (×6): qty 1

## 2023-03-06 NOTE — Progress Notes (Signed)
PROGRESS NOTE   Subjective/Complaints: Patient's chart reviewed- No issues reported overnight Vitals signs stable except for tachycardia, propanolol increased to 10mg  BID   ROS: Positives per HPI above. Denies fevers, chills, N/V, abdominal pain, SOB, chest pain, joint pain, new weakness or paraesthesias.    Objective:   No results found. No results for input(s): "WBC", "HGB", "HCT", "PLT" in the last 72 hours.     No results for input(s): "NA", "K", "CL", "CO2", "GLUCOSE", "BUN", "CREATININE", "CALCIUM" in the last 72 hours.     Intake/Output Summary (Last 24 hours) at 03/06/2023 1301 Last data filed at 03/06/2023 1240 Gross per 24 hour  Intake 1060 ml  Output --  Net 1060 ml       Pressure Injury 02/09/23 Heel Left Deep Tissue Pressure Injury - Purple or maroon localized area of discolored intact skin or blood-filled blister due to damage of underlying soft tissue from pressure and/or shear. Black/purple and boggy,  size of a quar (Active)  02/09/23 0543  Location: Heel  Location Orientation: Left  Staging: Deep Tissue Pressure Injury - Purple or maroon localized area of discolored intact skin or blood-filled blister due to damage of underlying soft tissue from pressure and/or shear.  Wound Description (Comments): Black/purple and boggy,  size of a quarter.  Foam dressing applied  Present on Admission: No    Physical Exam: Vital Signs Blood pressure 104/77, pulse (!) 103, temperature 98.2 F (36.8 C), resp. rate 16, height 5\' 1"  (1.549 m), weight 46.1 kg, SpO2 99%.  Gen: no distress, lying in bed eating with assistance of nursing HEENT: oral mucosa pink and moist, NCAT Cardio: Tachycardia Chest: CTAB, normal effort, normal rate of breathing Abd: soft, non-distended Ext: no edema Psych: pleasant, normal affect Abd: soft, non-distended, +BS Psych: flat affect, pressured speech, irritable mood Skin: +  Scaling dried skin on bilateral heels  - 2 small excoriations, sore on the base of the 5th metatarsal head -no longer present - rash on face - no longer present - stage 2 to right toe -no longer present  + area of redness between left thumb and index finger-now minimal, purple bruise left buttock unstageable - not examined +redness to bilateral elbows -not appreciated + Left heel stage III DTI, covered in foam dressing  Neurologic: AAOx2,  to year but not month or date. Follows most simple commands.  + Hypophonation, speech intelligability ~10%  Prior exams: MAS 1 right elbow,MAS 1 in RIght finger and wrist flexors  MAS 3 knee flexors, and MAS 3 L>R  hip adductors  MAS 3 L elbow , MAS 3-4 in L wrist and finger flexors  + LUE WHO--- Hypersensitivity to left hand improved  Motor 0/5 strength in LUE and LLE, showing volitional movements in right lower extremity but difficulty to obtain MMT due to her pain/braces, stable 10/21  Twice daily for right upper extremity 1/5 finger flexion, otherwise no volitional movement of left upper extremity or bilateral lowers + BL UE WHO.    Musculoskeletal: reduced bilateral shoulder and elbow ROM due to increased tone , pain with passive knee extension bilaterally   .   Assessment/Plan: 1. Functional deficits which require 3+ hours  per day of interdisciplinary therapy in a comprehensive inpatient rehab setting. Physiatrist is providing close team supervision and 24 hour management of active medical problems listed below. Physiatrist and rehab team continue to assess barriers to discharge/monitor patient progress toward functional and medical goals  Care Tool:  Bathing    Body parts bathed by patient: Face, Abdomen, Chest, Left upper leg, Right upper leg   Body parts bathed by helper: Right arm, Left arm, Front perineal area, Buttocks, Left lower leg, Right lower leg Body parts n/a: Right arm, Left arm, Front perineal area, Buttocks, Right upper  leg, Left upper leg, Right lower leg, Left lower leg   Bathing assist Assist Level: Maximal Assistance - Patient 24 - 49%     Upper Body Dressing/Undressing Upper body dressing   What is the patient wearing?: Pull over shirt    Upper body assist Assist Level: Moderate Assistance - Patient 50 - 74%    Lower Body Dressing/Undressing Lower body dressing      What is the patient wearing?: Pants, Incontinence brief     Lower body assist Assist for lower body dressing: Total Assistance - Patient < 25%     Toileting Toileting Toileting Activity did not occur Press photographer and hygiene only): N/A (no void or bm)  Toileting assist Assist for toileting: Dependent - Patient 0%     Transfers Chair/bed transfer  Transfers assist  Chair/bed transfer activity did not occur: Safety/medical concerns  Chair/bed transfer assist level: Dependent - Patient 0%     Locomotion Ambulation   Ambulation assist   Ambulation activity did not occur: Safety/medical concerns  Assist level: 2 helpers   Max distance: 49ft   Walk 10 feet activity   Assist  Walk 10 feet activity did not occur: Safety/medical concerns  Assist level: 2 helpers     Walk 50 feet activity   Assist Walk 50 feet with 2 turns activity did not occur: Safety/medical concerns         Walk 150 feet activity   Assist Walk 150 feet activity did not occur: Safety/medical concerns         Walk 10 feet on uneven surface  activity   Assist Walk 10 feet on uneven surfaces activity did not occur: Safety/medical concerns         Wheelchair     Assist Is the patient using a wheelchair?: Yes Type of Wheelchair: Manual    Wheelchair assist level: Dependent - Patient 0% Max wheelchair distance: 45ft    Wheelchair 50 feet with 2 turns activity    Assist    Wheelchair 50 feet with 2 turns activity did not occur: Safety/medical concerns   Assist Level: Dependent - Patient 0%    Wheelchair 150 feet activity     Assist  Wheelchair 150 feet activity did not occur: Safety/medical concerns   Assist Level: Dependent - Patient 0%   Blood pressure 104/77, pulse (!) 103, temperature 98.2 F (36.8 C), resp. rate 16, height 5\' 1"  (1.549 m), weight 46.1 kg, SpO2 99%.  Medical Problem List and Plan: 1. 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 Code status  DNR Continue CIR therapies including PT, OT SLP   2.  Impaired mobility: continue Lovenox, weekly creatinine ordered             -antiplatelet therapy: N/A  3. Knee paint: Tylenol as needed, continue voltaren gel scheduled. Prescribing Home Zynex NexWave   XR reviewed and is stable, discussed with therapy  4. History of anxiety: discussed with her mom that she feels this was a big risk factor for her accident  5. Neuropsych/cognition: This patient is not capable of making decisions on her own behalf  - 10/12: Still not capable of medical decision making; BUT oriented and communicating, following directions - MUCH improved .Will add PRN melatonin 3mg  10/20 for insomnia 6. Left buttock unstageable ulcer.  Continue Medihoney to buttock wound daily cover with foam dressing.  Changing dressing every 3 days or as needed soiling   7. Fluids/Electrolytes/Nutrition: Routine in and outs with follow-up chemistries  -Eating 100% most meals with assist and encouragement  8.  Cavitary right lower lobe pneumonia likely aspiration  pneumonia/MRSA pneumonia.  Resolved    Latest Ref Rng & Units 02/05/2023   11:12 AM 01/22/2023    6:36 AM 12/20/2022   10:54 AM  CBC  WBC 4.0 - 10.5 K/uL 8.5  8.1  5.6   Hemoglobin 12.0 - 15.0 g/dL 14.7  82.9  56.2   Hematocrit 36.0 - 46.0 % 46.7  47.5  42.8   Platelets 150 - 400 K/uL 284  274  253      9.  History of drug abuse.  Positive cocaine on urine drug screen.  Provide counseling  10.  AKI/hypovolemia and ATN.  Resolved Appears to have fair fluid intake, creatinine has been stable    Latest Ref Rng & Units 03/02/2023    7:24 AM 02/23/2023   10:37 AM 02/16/2023    7:49 AM  BMP  Creatinine 0.44 - 1.00 mg/dL 1.30  8.65  7.84     11.  Mild transaminitis with rhabdomyolysis.  Both resolved  12.  Hypotension. Resolved, d/c midodrine  -BP stable    13. Impaired initiation: continue amantadine 100mg  BID  14. Hyperlycemia: CBGs mildly elevated, d/ced checks  15. Spasticity:   -discussed serial casting with OT and her mom  - continue baclofen to 20mg  TID and tizanidine to 4mg  TID. Continue wrist, elbow brace, and PRAFOs. Ordered a night splint for LLE  Repeat CMP today given chronic tizanidine use  -9/7 continues to have significant tone despite use of tizanidine and baclofen, may be beneficial consider baclofen pump in outpatient setting if this causes significant difficulties in care  9/8 consider decrease baclofen if dyskinetic movements continue  10/5-6  continue orthotics, ROM, positioning  when possible 16. Bowel and bladder incontinence: continue bowel and bladder program  17. Left hand skin maceration: Eucerin ordered, discussed hand split with OT, conitnue  18. History of magnesium deficiency: magnesium 500mg  started HS  19. HTN: see above Increase tizanidine to 6mg  TID. Magnesium supplement added HS. Increase propanolol to 20mg  TID, continue this dose  -10/ 20 BP stable, continue current regimen    03/06/2023    8:47 AM 03/06/2023    3:01 AM 03/05/2023     8:28 PM  Vitals with BMI  Systolic 104 100 696  Diastolic 77 67 72  Pulse 103 75  90     20. Area of redness between left thumb and index finger: continue foam dressing  21. Scalp eczema: selsun blue ordered, continue  -9/7 patient's mother feels like this is causing her to itch, will start some hydrocortisone cream  22. Fatigue: improved, discussed with team may be secondary to anti-spasticity medications, B12 reviewed and is 335, in suboptimal range, will give 1,000U B12 injection 8/7, will order monthly as needed, metanx started, increase to BID, continue  23. Right wrist swelling: XR ordered and shows no acute fracture, shows 2.40mm ulnar variance, brace removed to given patient a break from it. Voltaren gel ordered prn for discomfort, continue, CT ordered and show no acute osseus injury, continue ace wrap  23. Dysphagia: continue D3/thins, d/c ensure as blood pressure severely elevates whenever restarted  24. MRSA nares positive: negative on repeat, precautions d/ced  25. Tachycardic: resolved, d/c propanolol.  continueTizanidine increased to 6mg  TID. Resolved, amantadine increased back to 100mg  BID, continue this dose  26.  Dyskinetic movements in head and neck - appears to be myoclonic jerks related to hypoxic brain injury - as discussed with pt mom, no specific meds for this on antispasticity meds , consioder benzo although with pt's SUD would avoid this class  EEG reviewed and was negative for seizure.  Decrease baclofen to 15mg  TID  27. Small sore on the base of 5th metatarsal head: local wound care, continue  28. Rash on face: resolved, discussed with mother could have been secondary to added sugars in nutritional supplements  29. Sacral pressure injury: air mattress ordered, Placed nursing order to offload q1 hour while awake, continue medihoney, wound care consulted, discussed with nursing that this is improving  30. Cervical extensor weakness: tried decreasing tizanidine  but spasticity appears increased and BP elevated so will increase back to 6mg  TID, continue this dose  31. Low protein stores: prosource ordered, continue  32. Intermittent lethargy: labs reviewed and are stable, discussed with team may be secondary to anti-spasticity medications  33. Blood blister to left heel: continue foam heel protectors.  34. Suboptimal vitamin D level: increase to 5,000 U daily. continue this dose  35. Hypotension: resolved, add back propanolol 10mg  daily. decrease baclofen to 15 mg TID, continue this dose  26. Foot pain: tramadol ordered prn, improved today, continue prn  27. S/p fall: CT head reviewed and is stable  28. Tachycardia: increase propanolol to 10mg  BID   LOS: 108 days A FACE TO FACE EVALUATION WAS PERFORMED  Nicole [name] P Nicole [name] 03/06/2023, 1:01 PM

## 2023-03-06 NOTE — Progress Notes (Signed)
Speech Language Pathology Daily Session Note  Patient Details  Name: Nicole [name] MRN: [id number] Date of Birth: 01/16/1976  Today's Date: 03/06/2023 SLP Individual Time: 1445-1530 SLP Individual Time Calculation (min): 45 min  Short Term Goals: Week 9: SLP Short Term Goal 1 (Week 9): Week 15 - Pt will perform verbal problem solving tasks with overall Mod A verbal and visual cues to verbalize solutions. SLP Short Term Goal 2 (Week 9): Week 15-Patient will utilize external aids for orientation to place and time with MinA multimodal cues SLP Short Term Goal 3 (Week 9): Week 15 - Pt will demonstrate sustained attention to basic tasks for 3 minutes with Mod A multimodal cues for redirection. SLP Short Term Goal 4 (Week 9): Week 15 - Pt will utilize increased vocal intensity w/ ModA verbal cues to remain 90% intelligibile at phrase level  Skilled Therapeutic Interventions: SLP conducted skilled therapy session targeting cognitive retraining goals. Patient requesting to complete personal hygiene tasks prior to cognitive task (I.e. brushing teeth and hair). Patient completed both tasks with min verbal cues for sequencing. SLP transported patient to speech therapy office, then guided patient through cognitive tasks. Patient required mod assist to orient to date and min-mod assist to name months of the year in order. Patient able to write name independently, though required mod verbal cues during writing of other information to space words/letters out accurately. During general knowledge memory tasks, patient required min to mod verbal cues for abstract thought formation. During session, patient benefited from modA to sustain attention to task for 10 minutes and mod verbal cues to increase vocal intensity. Patient was left in lowered bed with call bell in reach and bed alarm set. SLP will continue to target goals per plan of care.       Pain Pain Assessment Pain Scale: 0-10 Pain Score: 0-No  pain  Therapy/Group: Individual Therapy  Nicole [name], M.A., CCC-SLP  Nicole [name] 03/06/2023, 3:57 PM

## 2023-03-06 NOTE — Plan of Care (Signed)
  Problem: Consults Goal: RH STROKE PATIENT EDUCATION Description: See Patient Education module for education specifics  Outcome: Progressing   Problem: RH BOWEL ELIMINATION Goal: RH STG MANAGE BOWEL WITH ASSISTANCE Description: STG Manage Bowel with mod I Assistance. Outcome: Progressing Goal: RH STG MANAGE BOWEL W/MEDICATION W/ASSISTANCE Description: STG Manage Bowel with Medication with mod I Assistance. Outcome: Progressing   Problem: RH BLADDER ELIMINATION Goal: RH STG MANAGE BLADDER WITH ASSISTANCE Description: STG Manage Bladder With toileting Assistance Outcome: Progressing   Problem: RH SKIN INTEGRITY Goal: RH STG MAINTAIN SKIN INTEGRITY WITH ASSISTANCE Description: STG Maintain Skin Integrity With min  Assistance. Outcome: Progressing

## 2023-03-06 NOTE — Progress Notes (Signed)
Physical Therapy Session Note  Patient Details  Name: Nicole [name] MRN: [id number] Date of Birth: 1975/08/15  Today's Date: 03/06/2023 PT Individual Time: 1330-1356 PT Individual Time Calculation (min): 26 min   Short Term Goals: Week 2:  PT Short Term Goal 1 (Week 2): Week 14; pt will complete bed mobility with totalA (pt <25%) PT Short Term Goal 2 (Week 2): Week 14: pt will maintain unsupported sitting balance for up to 1 minute consistently with CGA PT Short Term Goal 3 (Week 2): Week 14: Pt will complete bed<>chair transfers with totalA (pt <25%) PT Short Term Goal 4 (Week 2): Pt will be assessed for custom wheelchair needs  Skilled Therapeutic Interventions/Progress Updates:      Pt reclined in TIS wheelchair to start. Agreeable to therapy session.   Transported to ortho rehab gym and setup at [place] to work on visual scanning, attention, and task sequencing. Pt used drum stick to reach for targets and complete the tasks. Pt completed trail making #1-#8 and #1-#25. Pt needing max cues and had motor perseveration. She was able to use RUE volitionally to tap the screen. Pt able to verbalize what she was trying to accomplish but had trouble with motor planning.   Returned to her room, left reclined in TIS w/c with chest strap in place. All needs met.   Therapy Documentation Precautions:  Precautions Precautions: Fall Precaution Comments: Contact precautions & delayed processing Required Braces or Orthoses: Other Brace Other Brace: B adjustable night splints; B elbow "cosey" splints; L palm protector; R mitt Restrictions Weight Bearing Restrictions: No General:      Therapy/Group: Individual Therapy  Orrin Brigham 03/06/2023, 1:57 PM

## 2023-03-06 NOTE — Progress Notes (Signed)
Occupational Therapy Session Note  Patient Details  Name: Nicole [name] MRN: [id number] Date of Birth: [birth year]  Today's Date: 03/06/2023 OT Individual Time: 1001-1030 OT Individual Time Calculation (min): 29 min    Short Term Goals: Week 9: OT Short Term Goal 1 (Week 9): Patient will change orientation of toothbrush/ hairbrush to accomodate task with min assist after set up OT Short Term Goal 2 (Week 9): Patient will spear food x 3 once utensil in hand, then bring to mouth with only contact assistance  Skilled Therapeutic Interventions/Progress Updates:  Skilled OT intervention completed with focus on volitional movement with BUE, engagement in desired occupations, and communication. Pt received seated in TIS w/c awake, verbally agreeable to session. No pain reported.  Pt with improved communication since last date with this OT, with pt verbally responding to questions though quietly at times with cues needed for volume. Pt declined toileting.  Per pt's history with art/painting, OT transported pt dependently in w/c <> gym for time to work on the [employer].  Seated in w/c, pt was able to use RUE volitionally with drum stick to tap different figures but pt with max difficulty identifying bells despite OT completing first. Transitioned to having pt react to stimuli (dot). Pt able to use RUE and intermittently LUE with off loading support by OT to tap dots as they appeared. Pt able to initiate movements without cues 2 times, however typically required step by step cues to initiate due to delayed processing but no assist for visually locating. Transitioned to drawing, with pt able to draw without assist or cues a triangle and circle, but with higher level objects likes "cat" as reported desired by therapist, pt unable. Able to draw her own name as familiar to her but not unfamiliar names/words.  Back in room, per pt request, OT braided pt's hair. Pt remained slightly tilted in TIS w/c, with safety  chest strap in place, chair alarm on/activated, and with all needs in reach at end of session.   Therapy Documentation Precautions:  Precautions Precautions: Fall Precaution Comments: Contact precautions & delayed processing Required Braces or Orthoses: Other Brace Other Brace: B adjustable night splints; B elbow "cosey" splints; L palm protector; R mitt Restrictions Weight Bearing Restrictions: No    Therapy/Group: Individual Therapy  Nicole Cacioppo E Nuno Brubacher, MS, OTR/L  03/06/2023, 11:10 AM

## 2023-03-06 NOTE — Progress Notes (Signed)
Nutrition Follow-up  DOCUMENTATION CODES:   Severe malnutrition in context of acute illness/injury  INTERVENTION:  -Continue dysphagia 3 diet per SLP recommendation.   -Continue double protein portions and 30 ml ProSource Plus BID, each supplement provides 100 kcals and 15 grams protein to optimize nutrient intakes.  -.MVI/Minerals  -Update weight, standing scale if able.   NUTRITION DIAGNOSIS:   Severe Malnutrition related to acute illness as evidenced by moderate fat depletion, moderate muscle depletion.-improving    GOAL:   Patient will meet greater than or equal to 90% of their needs    MONITOR:   PO intake, Supplement acceptance  REASON FOR ASSESSMENT:  Follow up     ASSESSMENT:   47 y.o. 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 PMH includes: anxiety, asthma, depression.  Intakes recorded average 94% x 8 meals. Patient answers questions with short answers.  She confirms she is taking the Prosource oral supplement.    Current medications reviewed and include vitamin C 500 mg daily, vitamin D3 25 mcg daily, I-methylfolate-B6-B12 one tablet daily, magnesium gluconate 500 mg daily, MVI/Minerals-1 tab dail.  Labs reviewed.   Diet Order:   Diet Order             DIET DYS 3 Room service appropriate? Yes; Fluid consistency: Thin  Diet effective now                   EDUCATION NEEDS:   Not appropriate for education at this time  Skin:  Skin Assessment: Skin Integrity Issues: Skin Integrity Issues:: DTI, Other (Comment) DTI: left heel intact, erythema, blanchable Unstageable: left buttocks Other: abrasion to buttocks, blister to right and left heel, ecchymosis to left and right abdomen, erythema to bilateral buttock  Skin Assessment: Skin Integrity Issues: Skin Integrity Issues:: DTI, Other (Comment) DTI: left heel intact, erythema, blanchable Other:  abrasion to buttocks, blister to right and left heel, ecchymosis to left and right abdomen, erythema to bilateral buttock  Last BM:  10/19, type 6-large, incontinence  Height:   Ht Readings from Last 1 Encounters:  11/18/22 5\' 1"  (1.549 m)    Weight:   Wt Readings from Last 1 Encounters:  02/17/23 46.1 kg    Ideal Body Weight:     BMI:  Body mass index is 19.2 kg/m.  Estimated Nutritional Needs:   Kcal:  1600-1800  Protein:  80-100 gm  Fluid:  >/= 1.6 L    [name], RDLD Clinical Dietitian See AMION for contact information

## 2023-03-06 NOTE — [place] FL2 (Deleted)
[place] MEDICAID FL2 LEVEL OF CARE FORM     IDENTIFICATION  Patient Name: Nicole [name] Birthdate: [birth year] Sex: female Admission Date (Current Location): 11/18/2022  [place] and [place] Number:  Haynes Bast [id number] S Facility and Address:  The [place], [address] [place], [place] [address]      Provider Number: [id number]  Attending Physician Name and Address:  Horton Chin, MD  Relative Name and Phone Number:  [name] Kenter-mom/legal guardian [phone]    Current Level of Care: Other (Comment) (rehab) Recommended Level of Care: Skilled Nursing Facility Prior Approval Number:    Date Approved/Denied:   PASRR Number: [id number]  Discharge Plan: SNF    Current Diagnoses: Patient Active Problem List   Diagnosis Date Noted   Essential hypertension 01/29/2023   Pressure injury of skin 01/29/2023   Protein-calorie malnutrition, severe 01/05/2023   Hypoxic brain injury (HCC) 11/18/2022   Cavitary pneumonia 11/05/2022   Substance use 11/05/2022   Acute respiratory failure (HCC) 10/31/2022   Nonallergic rhinitis 08/23/2020   Mild persistent asthma without complication 06/14/2020   Other allergic rhinitis 06/14/2020   Adverse reaction to food, subsequent encounter 06/14/2020   IBS (irritable bowel syndrome) 05/24/2016   Chronic venous insufficiency 05/24/2016   Migraine 05/24/2016   Pulmonary nodules 10/27/2014   Hepatitis C, chronic (HCC)    Depression 10/09/2013   OSA on CPAP - sees Dr. Jenne Campus in ENT 10/09/2013   Allergic rhinitis 05/30/2012   Cough variant asthma 02/08/2012    Orientation RESPIRATION BLADDER Height & Weight     Self  Normal Incontinent Weight: 101 lb 10.1 oz (46.1 kg) Height:  5\' 1"  (154.9 cm)  BEHAVIORAL SYMPTOMS/MOOD NEUROLOGICAL BOWEL NUTRITION STATUS      Incontinent Diet (Dys 3 thin liquids)  AMBULATORY STATUS COMMUNICATION OF NEEDS Skin   Total Care Verbally (50 % at times) PU Stage and  Appropriate Care   PU Stage 2 Dressing: Daily                   Personal Care Assistance Level of Assistance  Total care Bathing Assistance: Maximum assistance Feeding assistance: Limited assistance Dressing Assistance: Maximum assistance Total Care Assistance: Maximum assistance   Functional Limitations Info  Speech Sight Info: Adequate Hearing Info: Adequate Speech Info: Impaired    SPECIAL CARE FACTORS FREQUENCY  PT (By licensed PT), Bowel and bladder program, OT (By licensed OT), Speech therapy     PT Frequency: 2-3 x week OT Frequency: 2-3 x week Bowel and Bladder Program Frequency: Timed tolieting every 3-4 hours   Speech Therapy Frequency: 2x week      Contractures Contractures Info: Not present    Additional Factors Info  Code Status, Allergies Code Status Info: DNR Allergies Info: [place] nuts, cashews nuts ceclor nickel triptans     Isolation Precautions Info: Off MRSA precautions     Current Medications (03/06/2023):  This is the current hospital active medication list Current Facility-Administered Medications  Medication Dose Route Frequency Provider Last Rate Last Admin   (feeding supplement) PROSource Plus liquid 30 mL  30 mL Oral BID BM Raulkar, Drema Pry, MD   30 mL at 03/06/23 0848   acetaminophen (TYLENOL) tablet 650 mg  650 mg Oral Q6H PRN Charlton Amor, PA-C   650 mg at 03/03/23 2154   amantadine (SYMMETREL) 50 MG/5ML solution 100 mg  100 mg Oral BID Horton Chin, MD   100 mg at 03/06/23 0848   ascorbic acid (VITAMIN C)  tablet 500 mg  500 mg Oral Daily Raulkar, Drema Pry, MD   500 mg at 03/06/23 0846   baclofen (LIORESAL) tablet 15 mg  15 mg Oral TID Horton Chin, MD   15 mg at 03/06/23 0847   camphor-menthol (SARNA) lotion   Topical PRN Horton Chin, MD   Given at 01/28/23 2224   cholecalciferol (VITAMIN D3) 25 MCG (1000 UNIT) tablet 5,000 Units  5,000 Units Oral Daily Horton Chin, MD   5,000 Units at 03/06/23 0846    cloNIDine (CATAPRES - Dosed in mg/24 hr) patch 0.1 mg  0.1 mg Transdermal Weekly Raulkar, Drema Pry, MD   0.1 mg at 02/28/23 1457   diclofenac Sodium (VOLTAREN) 1 % topical gel 2 g  2 g Topical QID Raulkar, Drema Pry, MD   2 g at 03/06/23 0848   enoxaparin (LOVENOX) injection 40 mg  40 mg Subcutaneous Q24H AngiulliMcarthur Rossetti, PA-C   40 mg at 03/05/23 1818   gadobutrol (GADAVIST) 1 MMOL/ML injection 5 mL  5 mL Intravenous Once PRN Raulkar, Drema Pry, MD       Gerhardt's butt cream   Topical BID Jacquelynn Cree, PA-C   Given at 03/06/23 0960   hydrocerin (EUCERIN) cream   Topical BID Horton Chin, MD   Given at 03/06/23 0848   l-methylfolate-B6-B12 (METANX) 3-35-2 MG per tablet 1 tablet  1 tablet Oral BID Horton Chin, MD   1 tablet at 03/06/23 0847   magnesium gluconate (MAGONATE) tablet 500 mg  500 mg Oral QHS Raulkar, Drema Pry, MD   500 mg at 03/05/23 2235   melatonin tablet 3 mg  3 mg Oral QHS PRN Fanny Dance, MD   3 mg at 03/05/23 2027   multivitamin with minerals tablet 1 tablet  1 tablet Oral Daily Charlton Amor, PA-C   1 tablet at 03/06/23 0846   propranolol (INDERAL) tablet 10 mg  10 mg Oral BID Raulkar, Drema Pry, MD       selenium sulfide (SELSUN) 1 % shampoo   Topical PRN Raulkar, Drema Pry, MD   Given at 01/17/23 2056   tiZANidine (ZANAFLEX) tablet 6 mg  6 mg Oral TID Horton Chin, MD   6 mg at 03/06/23 0846   traMADol (ULTRAM) tablet 50 mg  50 mg Oral Q6H PRN Horton Chin, MD   50 mg at 03/05/23 1401     Discharge Medications: Please see discharge summary for a list of discharge medications.  Relevant Imaging Results:  Relevant Lab Results:   Additional Information SSN: [id number]  Duana Benedict, Lemar Livings, LCSW

## 2023-03-07 NOTE — Patient Care Conference (Signed)
Inpatient RehabilitationTeam Conference and Plan of Care Update Date: 03/07/2023   Time: 11:09 AM    Patient Name: Nicole [name]      Medical Record Number: [id number]  Date of Birth: 13-Jan-1976 Sex: Female         Room/Bed: 4W09C/4W09C-01 Payor Info: Payor: [employer] / Plan: [employer] / Product Type: *No Product type* /    Admit Date/Time:  11/18/2022  3:15 PM  Primary Diagnosis:  Hypoxic brain injury [facility])  Hospital Problems: Principal Problem:   Hypoxic brain injury (HCC) Active Problems:   Protein-calorie malnutrition, severe   Essential hypertension   Pressure injury of skin    Expected Discharge Date: Expected Discharge Date:  (SNF pending)  Team Members Present: Physician leading conference: Dr. Sula Soda Social Worker Present: Dossie Der, LCSW Nurse Present: Chana Bode, RN PT Present: Wynelle Link, PT OT Present: Bretta Bang, OT SLP Present: [name], SLP PPS Coordinator present : Fae Pippin, SLP     Current Status/Progress Goal Weekly Team Focus  Bowel/Bladder   pt incontinent of b/b   Regain continence   Assist with time toileting q2-4 qshift and prn    Swallow/Nutrition/ Hydration               ADL's   Mod assist upper body, max assist lower body, min assist self feeding, oral care   max assist   Improve activity participation, improve functional mobility, sitting balance, stand tolerance    Mobility   max/totalA for all functional mobility. Improvement in volitional movement, more consistent command follow, and improved intelligibility of needs/wants.   TotA overall  bed positioning, posture, functional transfers, sitting balance, stretching to tolerance    Communication   responds in 100% of opportunities, though vocal intensity decreased   mod A   steady progress toward improving speech intelligibility    Safety/Cognition/ Behavioral Observations  mod-maxA   modA   sustained attention,  orientation, basic problem solving    Pain   No c/o pain   Remain pain free   Assess pt for verbal and noverbal ques of pain qshift and prn    Skin   Erythema/redness to sacrum, foam applied, skin intact   Maintain skin integrity  Assess qshift and prn      Discharge Planning:  Her Medicaid was approved yesterday-have contacted [place] and have sent out [facility] Mom aware may need to go to another facility until [facility] has a bed for pt   Team Discussion: Patient post hypoxic brain injury with improving cognitive status but physically continues to needs max - total assist overall.    Patient on target to meet rehab goals: Needs min - mod assist for ADLs; feeding self, putting on shirt, etc. Continue to note confusion, poor orientation.  Needs mod verbal cues to increase volume of speech and verbally respond at a conversational level.   *See Care Plan and progress notes for long and short-term goals.   Revisions to Treatment Plan:  N/A   Teaching Needs: Safety, medications, skin care, dietary modifications, transfers, toileting, etc.   Current Barriers to Discharge: Decreased caregiver support, Home enviroment access/layout, Incontinence, and Insurance for SNF coverage  Possible Resolutions to Barriers: Long term medicaid application approved SNF pending     Medical Summary Current Status: fluctuating hypotension and hypertension, pressure injuries, spasticity, low protein stores  Barriers to Discharge: Medical stability  Barriers to Discharge Comments: fluctuating hypotension and hypertension, pressure injuries, spasticity, low protein stores Possible Resolutions to  Barriers/Weekly Focus: adjusting propanolol and baclofen, continue daiily wound care, continue baclofen and tizanidine, continue prosource, continue magnesium   Continued Need for Acute Rehabilitation Level of Care: The patient requires daily medical management by a physician with specialized training in  physical medicine and rehabilitation for the following reasons: Direction of a multidisciplinary physical rehabilitation program to maximize functional independence : Yes Medical management of patient stability for increased activity during participation in an intensive rehabilitation regime.: Yes Analysis of laboratory values and/or radiology reports with any subsequent need for medication adjustment and/or medical intervention. : Yes   I attest that I was present, lead the team conference, and concur with the assessment and plan of the team.   Chana Bode B 03/07/2023, 3:18 PM

## 2023-03-07 NOTE — Progress Notes (Signed)
Occupational Therapy Session Note  Patient Details  Name: Nicole [name] MRN: [id number] Date of Birth: 1976/03/13  Today's Date: 03/07/2023 OT Individual Time: 0730-0800 OT Individual Time Calculation (min): 30 min    Short Term Goals: Week 5:  OT Short Term Goal 1 (Week 5): Patient will attempt to use BLE to assist with scooting or early aspect of transfer OT Short Term Goal 1 - Progress (Week 5): Other (comment) OT Short Term Goal 2 (Week 5): Patient will use right hand to pull pants over hips to aide with dressing OT Short Term Goal 2 - Progress (Week 5): Other (comment) OT Short Term Goal 3 (Week 5): Patient will feed herself 100% of meal following set up with intermittent min assist OT Short Term Goal 3 - Progress (Week 5): Other (comment) OT Short Term Goal 4 (Week 5): Patient will complete oral care after set up with min cueing OT Short Term Goal 4 - Progress (Week 5): Other (comment) OT Short Term Goal 5 (Week 5): xxxx OT Short Term Goal 5 - Progress (Week 5): Other (comment)  Skilled Therapeutic Interventions/Progress Updates:    Patient received supine in bed - awake and alert.  Reports not sleeping well because she had some pain, and was watching show - my 1000 lb Life.  Patient agreeable to assisting with donning pants - moving left foot toward pants - sliding right foot into pants and pulling up over right hip.  Unable to pull up over left hip without assistance.  Patient assisted to sit at edge of bed - mod assist, and using right hand to pull self to sidelying then push toward sitting,  Pain in feet with transfer.  Patient once in chair, able to put both arms into sleeves, and pull over head with intermittent min assist!  Patient fed self breakfast after set up. Left up in wheelchair with NT at bedside.    Therapy Documentation Precautions:  Precautions Precautions: Fall Precaution Comments: Contact precautions & delayed processing Required Braces or Orthoses: Other  Brace Other Brace: B adjustable night splints; B elbow "cosey" splints; L palm protector; R mitt Restrictions Weight Bearing Restrictions: No   Pain:  Reports pain in left foot     Therapy/Group: Individual Therapy  [name] 03/07/2023, 7:57 AM

## 2023-03-07 NOTE — Progress Notes (Signed)
Speech Language Pathology Daily Session Note  Patient Details  Name: Nicole [name] MRN: [id number] Date of Birth: [birth year]  Today's Date: 03/07/2023 SLP Individual Time: 4132-4401 SLP Individual Time Calculation (min): 55 min  Short Term Goals: Week 9: SLP Short Term Goal 1 (Week 9): Week 15 - Pt will perform verbal problem solving tasks with overall Mod A verbal and visual cues to verbalize solutions. SLP Short Term Goal 2 (Week 9): Week 15-Patient will utilize external aids for orientation to place and time with MinA multimodal cues SLP Short Term Goal 3 (Week 9): Week 15 - Pt will demonstrate sustained attention to basic tasks for 3 minutes with Mod A multimodal cues for redirection. SLP Short Term Goal 4 (Week 9): Week 15 - Pt will utilize increased vocal intensity w/ ModA verbal cues to remain 90% intelligibile at phrase level  Skilled Therapeutic Interventions:   Pt seen for skilled SLP session to address cognitive goals. Session held in SLP therapy office.  Pt participated in structured calendar task and color matching task.  She required significant cues fill in numbers on a blank calendar accurately. Pt restless and often attempted to move numbers around or place number in sideways.  Pt able to complete the task with hand over hand assistance.  Pt required similar cues to complete color matching task accurately, though required hand over hand assistance to maintain color categories and avoid mixing up cards.  Pt completed simple addition task of grocery items with 20% accuracy given min-mod assistance. Pt returned to her room and set up with call bell in reach and chair alarm activated. Continue SLP PoC.   Pain Pain Assessment Pain Scale: 0-10 Pain Score: 0-No pain  Therapy/Group: Individual Therapy  Ellery Plunk 03/07/2023, 12:05 PM

## 2023-03-07 NOTE — [place] FL2 (Signed)
[place] MEDICAID FL2 LEVEL OF CARE FORM     IDENTIFICATION  Patient Name: Nicole [name] Birthdate: 02/16/1976 Sex: female Admission Date (Current Location): 11/18/2022  [place] and [place] Number:  Haynes Bast [id number] Facility and Address:  The [facility], [address] [place], [place] [address]      Provider Number: [id number]  Attending Physician Name and Address:  Horton Chin, MD  Relative Name and Phone Number:  Quincy Carnes [phone]    Current Level of Care: Other (Comment) (Rehab) Recommended Level of Care: Skilled Nursing Facility Prior Approval Number:    Date Approved/Denied:   PASRR Number: [id number]  Discharge Plan: SNF    Current Diagnoses: Patient Active Problem List   Diagnosis Date Noted   Essential hypertension 01/29/2023   Pressure injury of skin 01/29/2023   Protein-calorie malnutrition, severe 01/05/2023   Hypoxic brain injury (HCC) 11/18/2022   Cavitary pneumonia 11/05/2022   Substance use 11/05/2022   Acute respiratory failure (HCC) 10/31/2022   Nonallergic rhinitis 08/23/2020   Mild persistent asthma without complication 06/14/2020   Other allergic rhinitis 06/14/2020   Adverse reaction to food, subsequent encounter 06/14/2020   IBS (irritable bowel syndrome) 05/24/2016   Chronic venous insufficiency 05/24/2016   Migraine 05/24/2016   Pulmonary nodules 10/27/2014   Hepatitis C, chronic (HCC)    Depression 10/09/2013   OSA on CPAP - sees Dr. Jenne Campus in ENT 10/09/2013   Allergic rhinitis 05/30/2012   Cough variant asthma 02/08/2012    Orientation RESPIRATION BLADDER Height & Weight     Self  Normal Incontinent Weight: 101 lb 10.1 oz (46.1 kg) Height:  5\' 1"  (154.9 cm)  BEHAVIORAL SYMPTOMS/MOOD NEUROLOGICAL BOWEL NUTRITION STATUS      Incontinent Diet (Dys 3 thin liquids)  AMBULATORY STATUS COMMUNICATION OF NEEDS Skin   Total Care Verbally (at times) PU Stage and Appropriate Care PU  Stage 1 Dressing: Daily PU Stage 2 Dressing: Daily                   Personal Care Assistance Level of Assistance  Total care Bathing Assistance: Maximum assistance Feeding assistance: Limited assistance Dressing Assistance: Maximum assistance Total Care Assistance: Maximum assistance   Functional Limitations Info  Speech Sight Info: Adequate Hearing Info: Adequate Speech Info: Impaired    SPECIAL CARE FACTORS FREQUENCY  Bowel and bladder program     PT Frequency: 2-3 x week OT Frequency: 2-3 x week Bowel and Bladder Program Frequency: Timed tolieting 3-4 hours   Speech Therapy Frequency: 2x week      Contractures Contractures Info: Not present    Additional Factors Info  Code Status, Allergies Code Status Info: DNR Allergies Info: [place] nuts, Cashew nuts, Ceclor Nickel     Isolation Precautions Info: Off MRSA precautions     Current Medications (03/07/2023):  This is the current hospital active medication list Current Facility-Administered Medications  Medication Dose Route Frequency Provider Last Rate Last Admin   (feeding supplement) PROSource Plus liquid 30 mL  30 mL Oral BID BM Raulkar, Drema Pry, MD   30 mL at 03/07/23 0807   acetaminophen (TYLENOL) tablet 650 mg  650 mg Oral Q6H PRN Charlton Amor, PA-C   650 mg at 03/03/23 2154   amantadine (SYMMETREL) 50 MG/5ML solution 100 mg  100 mg Oral BID Horton Chin, MD   100 mg at 03/07/23 0272   ascorbic acid (VITAMIN C) tablet 500 mg  500 mg Oral Daily Raulkar, Drema Pry, MD  500 mg at 03/07/23 1610   baclofen (LIORESAL) tablet 15 mg  15 mg Oral TID Horton Chin, MD   15 mg at 03/07/23 9604   camphor-menthol (SARNA) lotion   Topical PRN Horton Chin, MD   Given at 01/28/23 2224   cholecalciferol (VITAMIN D3) 25 MCG (1000 UNIT) tablet 5,000 Units  5,000 Units Oral Daily Horton Chin, MD   5,000 Units at 03/07/23 5409   cloNIDine (CATAPRES - Dosed in mg/24 hr) patch 0.1 mg  0.1 mg  Transdermal Weekly Raulkar, Drema Pry, MD   0.1 mg at 02/28/23 1457   diclofenac Sodium (VOLTAREN) 1 % topical gel 2 g  2 g Topical QID Raulkar, Drema Pry, MD   2 g at 03/07/23 0808   enoxaparin (LOVENOX) injection 40 mg  40 mg Subcutaneous Q24H AngiulliMcarthur Rossetti, PA-C   40 mg at 03/06/23 1814   gadobutrol (GADAVIST) 1 MMOL/ML injection 5 mL  5 mL Intravenous Once PRN Raulkar, Drema Pry, MD       Gerhardt's butt cream   Topical BID Jacquelynn Cree, PA-C   Given at 03/07/23 8119   hydrocerin (EUCERIN) cream   Topical BID Horton Chin, MD   Given at 03/07/23 1478   l-methylfolate-B6-B12 (METANX) 3-35-2 MG per tablet 1 tablet  1 tablet Oral BID Horton Chin, MD   1 tablet at 03/07/23 2956   magnesium gluconate (MAGONATE) tablet 500 mg  500 mg Oral QHS Raulkar, Drema Pry, MD   500 mg at 03/06/23 2029   melatonin tablet 3 mg  3 mg Oral QHS PRN Fanny Dance, MD   3 mg at 03/06/23 2355   multivitamin with minerals tablet 1 tablet  1 tablet Oral Daily Charlton Amor, PA-C   1 tablet at 03/07/23 2130   propranolol (INDERAL) tablet 10 mg  10 mg Oral BID Horton Chin, MD   10 mg at 03/07/23 8657   selenium sulfide (SELSUN) 1 % shampoo   Topical PRN Horton Chin, MD   Given at 01/17/23 2056   tiZANidine (ZANAFLEX) tablet 6 mg  6 mg Oral TID Horton Chin, MD   6 mg at 03/07/23 8469   traMADol (ULTRAM) tablet 50 mg  50 mg Oral Q6H PRN Horton Chin, MD   50 mg at 03/06/23 2356     Discharge Medications: Please see discharge summary for a list of discharge medications.  Relevant Imaging Results:  Relevant Lab Results:   Additional Information SSN:[id number] needs restorative nursing  Calynn Ferrero, Lemar Livings, LCSW

## 2023-03-07 NOTE — Progress Notes (Signed)
Physical Therapy Session Note  Patient Details  Name: Nicole [name] MRN: [id number] Date of Birth: 26-Dec-1975  Today's Date: 03/07/2023 PT Individual Time: 1130-1200 PT Individual Time Calculation (min): 30 min   Short Term Goals: Week 2:  PT Short Term Goal 1 (Week 2): Week 14; pt will complete bed mobility with totalA (pt <25%) PT Short Term Goal 2 (Week 2): Week 14: pt will maintain unsupported sitting balance for up to 1 minute consistently with CGA PT Short Term Goal 3 (Week 2): Week 14: Pt will complete bed<>chair transfers with totalA (pt <25%) PT Short Term Goal 4 (Week 2): Pt will be assessed for custom wheelchair needs  Skilled Therapeutic Interventions/Progress Updates:       Pt in bed to start. Pt checked and had dry brief - pt unsure.   Supine<>sitting EOB with maxA for trunk and BLE management. Pt sacral sitting and pushing into extension when positioned EOB. Completed totalA squat pivot transfer into standard manual w/c - assist for repositioning to manage sacral sitting.  Transported to day room rehab gym and assisted onto mat table. Remainder of session focused on sitting balance and BLE stretching to tolerance, specifically her hip flexors. Stretching was accomplished via modified thomas stretch bilaterally. Pt frequently falling posteriorly with delayed and insufficient righting and protective response.  Tried to get her to anterior pelvic tilt by cueing her to lean her elbows on her thighs and looking at her toes, which did help.  Returned to her room and left sitting in manual wheelchair with safety belt alarm on. All needs met.    Therapy Documentation Precautions:  Precautions Precautions: Fall Precaution Comments: Contact precautions & delayed processing Required Braces or Orthoses: Other Brace Other Brace: B adjustable night splints; B elbow "cosey" splints; L palm protector; R mitt Restrictions Weight Bearing Restrictions: No General:       Therapy/Group: Individual Therapy  Tavyn Kurka P Vondra Aldredge  PT, DPT, CSRS  03/07/2023, 7:39 AM

## 2023-03-07 NOTE — Progress Notes (Signed)
Patient ID: Nicole [name], female   DOB: 04/14/1976, 47 y.o.   MRN: [id number]  [name] medicaid has ben approved have reached out to [facility] and have sent out her FL2 to facilities. Mom aware if no bed at [facility] will need to go to another facility and then could transfer once bed available. Mom and pt aware of this

## 2023-03-07 NOTE — Progress Notes (Signed)
PROGRESS NOTE   Subjective/Complaints: Making great progress Medicaid approved BP normalized Plan for SNF by the end of the week   ROS: Positives per HPI above. Denies fevers, chills, N/V, abdominal pain, SOB, chest pain, joint pain, new weakness or paraesthesias.    Objective:   No results found. No results for input(s): "WBC", "HGB", "HCT", "PLT" in the last 72 hours.     No results for input(s): "NA", "K", "CL", "CO2", "GLUCOSE", "BUN", "CREATININE", "CALCIUM" in the last 72 hours.     Intake/Output Summary (Last 24 hours) at 03/07/2023 1112 Last data filed at 03/07/2023 0900 Gross per 24 hour  Intake 680 ml  Output --  Net 680 ml       Pressure Injury 02/09/23 Heel Left Deep Tissue Pressure Injury - Purple or maroon localized area of discolored intact skin or blood-filled blister due to damage of underlying soft tissue from pressure and/or shear. Black/purple and boggy,  size of a quar (Active)  02/09/23 0543  Location: Heel  Location Orientation: Left  Staging: Deep Tissue Pressure Injury - Purple or maroon localized area of discolored intact skin or blood-filled blister due to damage of underlying soft tissue from pressure and/or shear.  Wound Description (Comments): Black/purple and boggy,  size of a quarter.  Foam dressing applied  Present on Admission: No    Physical Exam: Vital Signs Blood pressure 115/79, pulse 86, temperature 97.8 F (36.6 C), resp. rate 18, height 5\' 1"  (1.549 m), weight 46.1 kg, SpO2 97%.  Gen: no distress, lying in bed eating with assistance of nursing HEENT: oral mucosa pink and moist, NCAT Cardio: Normal rate Chest: CTAB, normal effort, normal rate of breathing Abd: soft, non-distended Ext: no edema Psych: pleasant, normal affect Abd: soft, non-distended, +BS Psych: flat affect, pressured speech, irritable mood Skin: + Scaling dried skin on bilateral heels  - 2 small  excoriations, sore on the base of the 5th metatarsal head -no longer present - rash on face - no longer present - stage 2 to right toe -no longer present  + area of redness between left thumb and index finger-now minimal, purple bruise left buttock unstageable - not examined +redness to bilateral elbows -not appreciated + Left heel stage III DTI, covered in foam dressing  Neurologic: AAOx2,  to year but not month or date. Follows most simple commands.  + Hypophonation, speech intelligability ~10%  Prior exams: MAS 1 right elbow,MAS 1 in RIght finger and wrist flexors  MAS 3 knee flexors, and MAS 3 L>R  hip adductors  MAS 3 L elbow , MAS 3-4 in L wrist and finger flexors  + LUE WHO--- Hypersensitivity to left hand improved  Motor 0/5 strength in LUE and LLE, showing volitional movements in right lower extremity but difficulty to obtain MMT due to her pain/braces, stable 10/21  Twice daily for right upper extremity 1/5 finger flexion, otherwise no volitional movement of left upper extremity or bilateral lowers + BL UE WHO.    Musculoskeletal: reduced bilateral shoulder and elbow ROM due to increased tone , pain with passive knee extension bilaterally   .   Assessment/Plan: 1. Functional deficits which require 3+ hours per day  of interdisciplinary therapy in a comprehensive inpatient rehab setting. Physiatrist is providing close team supervision and 24 hour management of active medical problems listed below. Physiatrist and rehab team continue to assess barriers to discharge/monitor patient progress toward functional and medical goals  Care Tool:  Bathing    Body parts bathed by patient: Face, Abdomen, Chest, Left upper leg, Right upper leg   Body parts bathed by helper: Right arm, Left arm, Front perineal area, Buttocks, Left lower leg, Right lower leg Body parts n/a: Right arm, Left arm, Front perineal area, Buttocks, Right upper leg, Left upper leg, Right lower leg, Left lower  leg   Bathing assist Assist Level: Maximal Assistance - Patient 24 - 49%     Upper Body Dressing/Undressing Upper body dressing   What is the patient wearing?: Pull over shirt    Upper body assist Assist Level: Moderate Assistance - Patient 50 - 74%    Lower Body Dressing/Undressing Lower body dressing      What is the patient wearing?: Pants, Incontinence brief     Lower body assist Assist for lower body dressing: Total Assistance - Patient < 25%     Toileting Toileting Toileting Activity did not occur Press photographer and hygiene only): N/A (no void or bm)  Toileting assist Assist for toileting: Dependent - Patient 0%     Transfers Chair/bed transfer  Transfers assist  Chair/bed transfer activity did not occur: Safety/medical concerns  Chair/bed transfer assist level: Dependent - Patient 0%     Locomotion Ambulation   Ambulation assist   Ambulation activity did not occur: Safety/medical concerns  Assist level: 2 helpers   Max distance: 54ft   Walk 10 feet activity   Assist  Walk 10 feet activity did not occur: Safety/medical concerns  Assist level: 2 helpers     Walk 50 feet activity   Assist Walk 50 feet with 2 turns activity did not occur: Safety/medical concerns         Walk 150 feet activity   Assist Walk 150 feet activity did not occur: Safety/medical concerns         Walk 10 feet on uneven surface  activity   Assist Walk 10 feet on uneven surfaces activity did not occur: Safety/medical concerns         Wheelchair     Assist Is the patient using a wheelchair?: Yes Type of Wheelchair: Manual    Wheelchair assist level: Dependent - Patient 0% Max wheelchair distance: 2ft    Wheelchair 50 feet with 2 turns activity    Assist    Wheelchair 50 feet with 2 turns activity did not occur: Safety/medical concerns   Assist Level: Dependent - Patient 0%   Wheelchair 150 feet activity     Assist   Wheelchair 150 feet activity did not occur: Safety/medical concerns   Assist Level: Dependent - Patient 0%   Blood pressure 115/79, pulse 86, temperature 97.8 F (36.6 C), resp. rate 18, height 5\' 1"  (1.549 m), weight 46.1 kg, SpO2 97%.  Medical Problem List and Plan: 1. 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 Code status  DNR Continue CIR therapies including PT, OT SLP Team conference 10/23   2.  Impaired mobility: continue Lovenox, weekly creatinine ordered             -antiplatelet therapy: N/A  3. Knee paint: Tylenol as needed, continue voltaren gel scheduled.   XR reviewed and is stable, discussed with therapy  4. History of anxiety: discussed with her mom that she feels this was a big risk factor for her accident, propanolol ordered.   5. Neuropsych/cognition: This patient is not capable of making decisions on her own behalf  - 10/12: Still not capable of medical decision making; BUT oriented and communicating, following directions - MUCH improved .Will add PRN melatonin 3mg  10/20 for insomnia 6. Left buttock unstageable ulcer.  Continue Medihoney to buttock wound daily cover with foam dressing.  Changing dressing every 3 days or as needed soiling   7. Fluids/Electrolytes/Nutrition: Routine in and outs with follow-up chemistries  -Eating 100% most meals with assist and encouragement  8.  Cavitary right lower lobe pneumonia likely aspiration pneumonia/MRSA pneumonia.  Resolved    Latest  Ref Rng & Units 02/05/2023   11:12 AM 01/22/2023    6:36 AM 12/20/2022   10:54 AM  CBC  WBC 4.0 - 10.5 K/uL 8.5  8.1  5.6   Hemoglobin 12.0 - 15.0 g/dL 16.1  09.6  04.5   Hematocrit 36.0 - 46.0 % 46.7  47.5  42.8   Platelets 150 - 400 K/uL 284  274  253      9.  History of drug abuse.  Positive cocaine on urine drug screen.  Provide counseling  10.  AKI/hypovolemia and ATN.  Resolved Appears to have fair fluid intake, creatinine has been stable    Latest Ref Rng & Units 03/02/2023    7:24 AM 02/23/2023   10:37 AM 02/16/2023    7:49 AM  BMP  Creatinine 0.44 - 1.00 mg/dL 4.09  8.11  9.14     11.  Mild transaminitis with rhabdomyolysis.  Both resolved  12.  Hypotension. Resolved, d/c midodrine  -BP stable    13. Impaired initiation: continue amantadine 100mg  BID  14. Hyperlycemia: CBGs mildly elevated, d/ced checks  15. Spasticity:   -discussed serial casting with OT and her mom  - continue baclofen to 20mg  TID and tizanidine to 4mg  TID. Continue wrist, elbow brace, and PRAFOs. Ordered a night splint for LLE  Repeat CMP today given chronic tizanidine use  -9/7 continues to have significant tone despite use of tizanidine and baclofen, may be beneficial consider baclofen pump in outpatient setting if this causes significant difficulties in care  9/8 consider decrease baclofen if dyskinetic movements continue  10/5-6  continue orthotics, ROM, positioning  when possible 16. Bowel and bladder incontinence: continue bowel and bladder program  17. Left hand skin maceration: Eucerin ordered, discussed hand split with OT, conitnue  18. History of magnesium deficiency: magnesium 500mg  started HS  19. HTN: see above Increase tizanidine to 6mg  TID. Magnesium supplement added HS. Increase propanolol to 20mg  TID, continue this dose  -10/ 20 BP stable, continue current regimen    03/07/2023    3:11 AM 03/06/2023    7:44 PM 03/06/2023    2:47 PM  Vitals with BMI  Systolic 115 102 782   Diastolic 79 72 105  Pulse 86 95 92  20. Area of redness between left thumb and index finger: continue foam dressing  21. Scalp eczema: selsun blue ordered, continue  -9/7 patient's mother feels like this is causing her to itch, will start some hydrocortisone cream  22. Fatigue: improved, discussed with team may be secondary to anti-spasticity medications, B12 reviewed and is 335, in suboptimal range, will give 1,000U B12 injection 8/7, will order monthly as needed, metanx started, increase to BID, continue  23. Right wrist swelling: XR ordered and shows no acute fracture, shows 2.38mm ulnar variance, brace removed to given patient a break from it. Voltaren gel ordered prn for discomfort, continue, CT ordered and show no acute osseus injury, continue ace wrap  23. Dysphagia: continue D3/thins, d/c ensure as blood pressure severely elevates whenever restarted  24. MRSA nares positive: negative on repeat, precautions d/ced  25. Tachycardic: resolved, d/c propanolol.  continueTizanidine increased to 6mg  TID. Resolved, amantadine increased back to 100mg  BID, continue this dose  26.  Dyskinetic movements in head and neck - appears to be myoclonic jerks related to hypoxic brain injury - as discussed with pt mom, no specific meds for this on antispasticity meds , consioder benzo although with pt's SUD would avoid this class  EEG reviewed and was negative for seizure.  Decrease baclofen to 15mg  TID  27. Small sore on the base of 5th metatarsal head: local wound care, continue  28. Rash on face: resolved, discussed with mother could have been secondary to added sugars in nutritional supplements  29. Sacral pressure injury: air mattress ordered, Placed nursing order to offload q1 hour while awake, continue medihoney, wound care consulted, discussed with nursing that this is improving  30. Cervical extensor weakness: tried decreasing tizanidine but spasticity appears increased and BP elevated  so will increase back to 6mg  TID, continue this dose  31. Low protein stores: prosource ordered, continue  32. Intermittent lethargy: labs reviewed and are stable, discussed with team may be secondary to anti-spasticity medications  33. Blood blister to left heel: continue foam heel protectors.  34. Suboptimal vitamin D level: increase to 5,000 U daily. continue this dose  35. Hypotension: resolved, add back propanolol 10mg  daily. decrease baclofen to 15 mg TID, continue this dose  26. Foot pain: tramadol ordered prn, improved today, continue prn  27. S/p fall: CT head reviewed and is stable  28. Tachycardia: increase propanolol to 10mg  BID   LOS: 109 days A FACE TO FACE EVALUATION WAS PERFORMED  Jenille Laszlo P Luie Laneve 03/07/2023, 11:12 AM

## 2023-03-08 NOTE — Progress Notes (Signed)
Physical Therapy Session Note  Patient Details  Name: Nicole [name] MRN: [id number] Date of Birth: Jul 16, 1975  Today's Date: 03/08/2023 PT Individual Time: 8295-6213 PT Individual Time Calculation (min): 43 min   Short Term Goals: Week 2:  PT Short Term Goal 1 (Week 2): Week 14; pt will complete bed mobility with totalA (pt <25%) PT Short Term Goal 2 (Week 2): Week 14: pt will maintain unsupported sitting balance for up to 1 minute consistently with CGA PT Short Term Goal 3 (Week 2): Week 14: Pt will complete bed<>chair transfers with totalA (pt <25%) PT Short Term Goal 4 (Week 2): Pt will be assessed for custom wheelchair needs  Skilled Therapeutic Interventions/Progress Updates:      Pt sitting upright in manual wheelchair - appears safe without concern of sliding or falling out of chair. Pt continues to be more verbal but today had more difficulty understanding her with run on sentences and mumbling of her words.   Transported to day room rehab gym and assisted onto mat table with totalA squat pivot transfer. She was positioned in supine and worked first on stretching her cervical musculature into extension with L lateral flexion as she tends to keep her R ear close to her R shoulder in lateral flexion and rotation to the L. R SCM quite tight. Then worked on addressing bed mobility with AAROM rolling to the L and to the R for NMR. She required minA for rolling both directions, primarily for initiation and positioning.   Pt assisted back to her wheelchair with similar assist - returned to her room and patient requesting to return to bed. All needs met at end with pt positioned comfortably in bed.    Therapy Documentation Precautions:  Precautions Precautions: Fall Precaution Comments: Contact precautions & delayed processing Required Braces or Orthoses: Other Brace Other Brace: B adjustable night splints; B elbow "cosey" splints; L palm protector; R mitt Restrictions Weight Bearing  Restrictions: No     Therapy/Group: Individual Therapy  Orrin Brigham 03/08/2023, 7:40 AM

## 2023-03-08 NOTE — Progress Notes (Signed)
PROGRESS NOTE   Subjective/Complaints: No new complaints this morning Tolerated therapy well today Patient's chart reviewed- No issues reported overnight Vitals signs stable    ROS: Positives per HPI above. Denies fevers, chills, N/V, abdominal pain, SOB, chest pain, joint pain, new weakness or paraesthesias.    Objective:   No results found. No results for input(s): "WBC", "HGB", "HCT", "PLT" in the last 72 hours.     No results for input(s): "NA", "K", "CL", "CO2", "GLUCOSE", "BUN", "CREATININE", "CALCIUM" in the last 72 hours.     Intake/Output Summary (Last 24 hours) at 03/08/2023 1103 Last data filed at 03/08/2023 0845 Gross per 24 hour  Intake 598 ml  Output --  Net 598 ml       Pressure Injury 02/09/23 Heel Left Deep Tissue Pressure Injury - Purple or maroon localized area of discolored intact skin or blood-filled blister due to damage of underlying soft tissue from pressure and/or shear. Black/purple and boggy,  size of a quar (Active)  02/09/23 0543  Location: Heel  Location Orientation: Left  Staging: Deep Tissue Pressure Injury - Purple or maroon localized area of discolored intact skin or blood-filled blister due to damage of underlying soft tissue from pressure and/or shear.  Wound Description (Comments): Black/purple and boggy,  size of a quarter.  Foam dressing applied  Present on Admission: No    Physical Exam: Vital Signs Blood pressure 119/85, pulse 74, temperature 98.2 F (36.8 C), temperature source Oral, resp. rate 16, height 5\' 1"  (1.549 m), weight 46.1 kg, SpO2 100%.  Gen: no distress, lying in bed eating with assistance of nursing HEENT: oral mucosa pink and moist, NCAT Cardio: Normal rate Chest: CTAB, normal effort, normal rate of breathing Abd: soft, non-distended Ext: no edema Psych: pleasant, normal affect Abd: soft, non-distended, +BS Psych: flat affect, pressured speech,  irritable mood Skin: + Scaling dried skin on bilateral heels  - 2 small excoriations, sore on the base of the 5th metatarsal head -no longer present - rash on face - no longer present - stage 2 to right toe -no longer present  + area of redness between left thumb and index finger-now minimal, purple bruise left buttock unstageable - not examined +redness to bilateral elbows -not appreciated + Left heel stage III DTI, covered in foam dressing  Neurologic: AAOx2,  to year but not month or date. Follows most simple commands.  + Hypophonation, speech intelligability ~10%  Prior exams: MAS 1 right elbow,MAS 1 in RIght finger and wrist flexors  MAS 3 knee flexors, and MAS 3 L>R  hip adductors  MAS 3 L elbow , MAS 3-4 in L wrist and finger flexors  + LUE WHO--- Hypersensitivity to left hand improved  Motor 0/5 strength in LUE and LLE, showing volitional movements in right lower extremity but difficulty to obtain MMT due to her pain/braces, able to do upper body dressing now!   Twice daily for right upper extremity 1/5 finger flexion, otherwise no volitional movement of left upper extremity or bilateral lowers + BL UE WHO.    Musculoskeletal: reduced bilateral shoulder and elbow ROM due to increased tone , pain with passive knee extension bilaterally   .  Assessment/Plan: 1. Functional deficits which require 3+ hours per day of interdisciplinary therapy in a comprehensive inpatient rehab setting. Physiatrist is providing close team supervision and 24 hour management of active medical problems listed below. Physiatrist and rehab team continue to assess barriers to discharge/monitor patient progress toward functional and medical goals  Care Tool:  Bathing    Body parts bathed by patient: Face, Abdomen, Chest, Left upper leg, Right upper leg   Body parts bathed by helper: Right arm, Left arm, Front perineal area, Buttocks, Left lower leg, Right lower leg Body parts n/a: Right arm, Left  arm, Front perineal area, Buttocks, Right upper leg, Left upper leg, Right lower leg, Left lower leg   Bathing assist Assist Level: Maximal Assistance - Patient 24 - 49%     Upper Body Dressing/Undressing Upper body dressing   What is the patient wearing?: Pull over shirt    Upper body assist Assist Level: Moderate Assistance - Patient 50 - 74%    Lower Body Dressing/Undressing Lower body dressing      What is the patient wearing?: Pants, Incontinence brief     Lower body assist Assist for lower body dressing: Total Assistance - Patient < 25%     Toileting Toileting Toileting Activity did not occur Press photographer and hygiene only): N/A (no void or bm)  Toileting assist Assist for toileting: Dependent - Patient 0%     Transfers Chair/bed transfer  Transfers assist  Chair/bed transfer activity did not occur: Safety/medical concerns  Chair/bed transfer assist level: Dependent - Patient 0%     Locomotion Ambulation   Ambulation assist   Ambulation activity did not occur: Safety/medical concerns  Assist level: 2 helpers   Max distance: 39ft   Walk 10 feet activity   Assist  Walk 10 feet activity did not occur: Safety/medical concerns  Assist level: 2 helpers     Walk 50 feet activity   Assist Walk 50 feet with 2 turns activity did not occur: Safety/medical concerns         Walk 150 feet activity   Assist Walk 150 feet activity did not occur: Safety/medical concerns         Walk 10 feet on uneven surface  activity   Assist Walk 10 feet on uneven surfaces activity did not occur: Safety/medical concerns         Wheelchair     Assist Is the patient using a wheelchair?: Yes Type of Wheelchair: Manual    Wheelchair assist level: Dependent - Patient 0% Max wheelchair distance: 39ft    Wheelchair 50 feet with 2 turns activity    Assist    Wheelchair 50 feet with 2 turns activity did not occur: Safety/medical  concerns   Assist Level: Dependent - Patient 0%   Wheelchair 150 feet activity     Assist  Wheelchair 150 feet activity did not occur: Safety/medical concerns   Assist Level: Dependent - Patient 0%   Blood pressure 119/85, pulse 74, temperature 98.2 F (36.8 C), temperature source Oral, resp. rate 16, height 5\' 1"  (1.549 m), weight 46.1 kg, SpO2 100%.  Medical Problem List and Plan: 1. 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 Code status  DNR Continue CIR therapies including PT, OT SLP Team conference 10/23   2.  Impaired mobility: continue Lovenox, weekly creatinine ordered             -antiplatelet therapy: N/A  3. Knee paint: Tylenol as needed, continue voltaren gel scheduled.   XR reviewed and is stable, discussed with therapy  4. History of anxiety: discussed with her mom that she feels this was a big risk factor for her accident, propanolol ordered.   5. Neuropsych/cognition: This patient is not capable of making decisions on her own behalf  - 10/12: Still not capable of medical decision making; BUT oriented and communicating, following directions - MUCH improved .Will add PRN melatonin 3mg  10/20 for insomnia 6. Left buttock unstageable ulcer.  continue Medihoney to buttock wound daily cover with foam dressing.  Changing dressing every 3 days or as needed soiling   7. Fluids/Electrolytes/Nutrition: Routine in and outs with follow-up chemistries  -Eating 100% most meals with assist  and encouragement  8.  Cavitary right lower lobe pneumonia likely aspiration pneumonia/MRSA pneumonia.  Resolved    Latest Ref Rng & Units 02/05/2023   11:12 AM 01/22/2023    6:36 AM 12/20/2022   10:54 AM  CBC  WBC 4.0 - 10.5 K/uL 8.5  8.1  5.6   Hemoglobin 12.0 - 15.0 g/dL 40.9  81.1  91.4   Hematocrit 36.0 - 46.0 % 46.7  47.5  42.8   Platelets 150 - 400 K/uL 284  274  253      9.  History of drug abuse.  Positive cocaine on urine drug screen.  Provide counseling  10.  AKI/hypovolemia and ATN.  Resolved Appears to have fair fluid intake, creatinine has been stable    Latest Ref Rng & Units 03/02/2023    7:24 AM 02/23/2023   10:37 AM 02/16/2023    7:49 AM  BMP  Creatinine 0.44 - 1.00 mg/dL 7.82  9.56  2.13     11.  Mild transaminitis with rhabdomyolysis.  Both resolved  12.  Hypotension. Resolved, d/c midodrine  -BP stable    13. Impaired initiation: continue amantadine 100mg  BID  14. Hyperlycemia: CBGs mildly elevated, d/ced checks  15. Spasticity:   -discussed serial casting with OT and her mom  - continue baclofen to 20mg  TID and tizanidine to 4mg  TID. Continue wrist, elbow brace, and PRAFOs. Ordered a night splint for LLE  Repeat CMP today given chronic tizanidine use  -9/7 continues to have significant tone despite use of tizanidine and baclofen, may be beneficial consider baclofen pump in outpatient setting if this causes significant difficulties in care  9/8 consider decrease baclofen if dyskinetic movements continue  10/5-6  continue orthotics, ROM, positioning  when possible 16. Bowel and bladder incontinence: continue bowel and bladder program  17. Left hand skin maceration: Eucerin ordered, discussed hand split with OT, conitnue  18. History of magnesium deficiency: magnesium 500mg  started HS  19. HTN: see above Increase tizanidine to 6mg  TID. Magnesium supplement added HS. Increase propanolol to 20mg  TID, continue this dose  -10/ 20 BP stable, continue  current regimen    03/08/2023    3:23 AM 03/07/2023    7:37 PM 03/07/2023    2:46 PM  Vitals with BMI  Systolic 119 107 91  Diastolic 85 76 67  Pulse 74 87 84     20. Area of redness between left thumb and index finger: continue foam dressing  21. Scalp eczema: selsun blue ordered, continue  -9/7 patient's mother feels like this is causing her to itch, will start some hydrocortisone cream  22. Fatigue: improved, discussed with team may be secondary to anti-spasticity medications, B12 reviewed and is 335, in suboptimal range, will give 1,000U B12 injection 8/7, will order monthly as needed, metanx started, increase to BID, continue  23. Right wrist swelling: XR ordered and shows no acute fracture, shows 2.22mm ulnar variance, brace removed to given patient a break from it. Voltaren gel ordered prn for discomfort, continue, CT ordered and show no acute osseus injury, continue ace wrap  23. Dysphagia: continue D3/thins, d/c ensure as blood pressure severely elevates whenever restarted  24. MRSA nares positive: negative on repeat, precautions d/ced  25. Tachycardic: resolved, d/c propanolol.  continueTizanidine increased to 6mg  TID. Resolved, amantadine increased back to 100mg  BID, continue this dose  26.  Dyskinetic movements in head and neck - appears to be myoclonic jerks related to hypoxic brain injury - as discussed with pt mom, no specific meds for this on antispasticity meds , consioder benzo although with pt's SUD would avoid this class  EEG reviewed and was negative for seizure.  Decrease baclofen to 15mg  TID  27. Small sore on the base of 5th metatarsal head: local wound care, continue  28. Rash on face: resolved, discussed with mother could have been secondary to added sugars in nutritional supplements  29. Sacral pressure injury: air mattress ordered, Placed nursing order to offload q1 hour while awake, continue medihoney, wound care consulted, discussed with nursing that  this is improving  30. Cervical extensor weakness: tried decreasing tizanidine but spasticity appears increased and BP elevated so will increase back to 6mg  TID, continue this dose  31. Low protein stores: prosource ordered, continue  32. Intermittent lethargy: labs reviewed and are stable, discussed with team may be secondary to anti-spasticity medications  33. Blood blister to left heel: continue foam heel protectors.  34. Suboptimal vitamin D level: increase to 5,000 U daily. continue this dose  35. Hypotension: resolved, add back propanolol 10mg  daily. decrease baclofen to 15 mg TID, continue this dose  26. Foot pain: tramadol ordered prn, improved today, continue prn  27. S/p fall: CT head reviewed and is stable  28. Tachycardia: increase propanolol to 10mg  BID   LOS: 110 days A FACE TO FACE EVALUATION WAS PERFORMED  Nicole [name] P Nicole [name] 03/08/2023, 11:03 AM

## 2023-03-08 NOTE — Progress Notes (Signed)
Speech Language Pathology Daily Session Note  Patient Details  Name: Nicole [name] MRN: [id number] Date of Birth: 1975-12-26  Today's Date: 03/08/2023 SLP Individual Time: 5366-4403 SLP Individual Time Calculation (min): 38 min  Short Term Goals: Week 9: SLP Short Term Goal 1 (Week 9): Week 15 - Pt will perform verbal problem solving tasks with overall Mod A verbal and visual cues to verbalize solutions. SLP Short Term Goal 2 (Week 9): Week 15-Patient will utilize external aids for orientation to place and time with MinA multimodal cues SLP Short Term Goal 3 (Week 9): Week 15 - Pt will demonstrate sustained attention to basic tasks for 3 minutes with Mod A multimodal cues for redirection. SLP Short Term Goal 4 (Week 9): Week 15 - Pt will utilize increased vocal intensity w/ ModA verbal cues to remain 90% intelligibile at phrase level  Skilled Therapeutic Interventions: SLP conducted skilled therapy session targeting speech intelligibility and cognitive retraining goals. Patient received reclined in bed and agreeable to speech therapy services. SLP repositioned patient to semi-upright, then conducted speech and cognitive tasks. Throughout session, patient's intelligibility ~40% at the conversation level, requiring modA to utilize "loud" and "overarticulate" strategies. Utilizing these strategies, patient 90% intelligible at the phrase level but only 65% intelligible at sentence level. During cognitive tasks, patient sustained attention to task for 3 minutes given mod multimodal cues for redirection. During basic problem solving tasks at the beginning of session requiring reading and sequencing, patient benefited from min assist to sequence items correctly, though towards end of session, patient became somnolent and was difficult to rouse. Session truncated by 7 minutes due to patient fatigue and somnolence. Patient was left in lowered bed with call bell in reach and bed alarm set. SLP will continue to  target goals per plan of care.       Pain Pain Assessment Pain Scale: Faces Pain Score: 0-No pain Faces Pain Scale: Hurts whole lot (Intermittent, only during observed muscle spasms.)  Therapy/Group: Individual Therapy  [name], M.A., CCC-SLP  [name] 03/08/2023, 9:40 AM

## 2023-03-08 NOTE — Progress Notes (Signed)
Occupational Therapy Session Note  Patient Details  Name: Nicole [name] MRN: [id number] Date of Birth: 1976-01-29  Today's Date: 03/08/2023 OT Individual Time: 4098-1191 OT Individual Time Calculation (min): 30 min    Short Term Goals: Week 15:  OT Short Term Goal 1 (Week 15): Patient will attempt to use BLE to assist with scooting or early aspect of transfer OT Short Term Goal 1 - Progress (Week 15): Other (comment) OT Short Term Goal 2 (Week 15): Patient will use right hand to pull pants over hips to aide with dressing OT Short Term Goal 2 - Progress (Week 5): Other (comment) OT Short Term Goal 3 (Week 15): Patient will feed herself 100% of meal following set up with intermittent min assist OT Short Term Goal 3 - Progress (Week 15): Other (comment) OT Short Term Goal 4 (Week 5): Patient will complete oral care after set up with min cueing OT Short Term Goal 4 - Progress (Week 5): Other (comment) OT Short Term Goal 5 (Week 5): xxxx OT Short Term Goal 5 - Progress (Week 5): Other (comment)  Skilled Therapeutic Interventions/Progress Updates:    Patient received supine in bed - lethargic upon arrival.  Allowed time for patient to wake to her name and increasing stimulus in room.  Patient agreeable to getting out of bed for OT and even indicated "bathroom"  and "pick out a top."  (As is routine in am) Patient continues to report pain in left foot - even painful to move foot through pant leg.  Patient helps put RLE into pants and pull over hips using half bridging and rolling using RUE.  Patient grasps right hand on bed rail to aide with rolling but at times has difficulty letting go.  Patient allowing LE's to be moved into flexion and rotation to get to edge of bed.  Total assist transfer to chair and patient report pain in feet with transition.  Completed grooming and UB dressing at sink with min assist.  Left up in wheelchair with safety belt in place and engaged and call bell in lap.     Therapy Documentation Precautions:  Precautions Precautions: Fall Precaution Comments: Contact precautions & delayed processing Required Braces or Orthoses: Other Brace Other Brace: B adjustable night splints; B elbow "cosey" splints; L palm protector; R mitt Restrictions Weight Bearing Restrictions: No   Pain: Pain Assessment Pain Scale: Faces Pain Score: 0-No pain Faces Pain Scale: Hurts whole lot (Intermittent, only during observed muscle spasms.)     Therapy/Group: Individual Therapy  [name] 03/08/2023, 12:19 PM

## 2023-03-09 LAB — CREATININE, SERUM
Creatinine, Ser: 0.77 mg/dL (ref 0.44–1.00)
GFR, Estimated: >60 mL/min (ref >60)

## 2023-03-09 MED ORDER — PROPRANOLOL HCL 20 MG PO TABS
10.0000 mg | ORAL_TABLET | Freq: Every day | ORAL | Status: DC
  Administered 2023-03-10: 10 mg via ORAL
  Filled 2023-03-09: qty 1

## 2023-03-09 NOTE — Progress Notes (Signed)
Physical Therapy Session Note  Patient Details  Name: Nicole [name] MRN: [id number] Date of Birth: 02/28/1976  Today's Date: 03/09/2023 PT Individual Time: 4098-1191 PT Individual Time Calculation (min): 30 min   Short Term Goals: Week 2:  PT Short Term Goal 1 (Week 2): Week 14; pt will complete bed mobility with totalA (pt <25%) PT Short Term Goal 2 (Week 2): Week 14: pt will maintain unsupported sitting balance for up to 1 minute consistently with CGA PT Short Term Goal 3 (Week 2): Week 14: Pt will complete bed<>chair transfers with totalA (pt <25%) PT Short Term Goal 4 (Week 2): Pt will be assessed for custom wheelchair needs  Skilled Therapeutic Interventions/Progress Updates:      Pt sitting in w/c to start - agreeable to therapy treatment.   Transported to day room rehab gym and assisted to mat table with totalA squat pivot transfer. Worked on unsupported sitting balance at edge of mat - level of assist fluctuating b/w min/modA with posterior bias and sacral sitting - rigid tilt. Instructed patient in a forward reaching task - placing weighted balls into laundry basket - needing hand over hand assist for initiation but does demonstrate some volitional and purposeful movement patterns.   Returned to her w/c with similar assist and then left sitting up in wheelchair with seat belt alarm on and call bell in lap at end of treatment. Telesitter in room.   Therapy Documentation Precautions:  Precautions Precautions: Fall Precaution Comments: Contact precautions & delayed processing Required Braces or Orthoses: Other Brace Other Brace: B adjustable night splints; B elbow "cosey" splints; L palm protector; R mitt Restrictions Weight Bearing Restrictions: No General:      Therapy/Group: Individual Therapy  Muzamil Harker P Cricket Goodlin  PT, DPT, CSRS  03/09/2023, 7:48 AM

## 2023-03-09 NOTE — Progress Notes (Signed)
Speech Language Pathology Weekly Progress Note  Patient Details  Name: CIANI QUARLES MRN: [id number] Date of Birth: 12/20/1975  Beginning of progress report period: March 02, 2023 End of progress report period: March 09, 2023   Short Term Goals: Week 9: SLP Short Term Goal 1 (Week 15): Week 15 - Pt will perform verbal problem solving tasks with overall Mod A verbal and visual cues to verbalize solutions. SLP Short Term Goal 1 - Progress (Week 15): Met SLP Short Term Goal 2 (Week 15): Week 15-Patient will utilize external aids for orientation to place and time with MinA multimodal cues SLP Short Term Goal 2 - Progress (Week 15): Met SLP Short Term Goal 3 (Week 15): Week 15 - Pt will demonstrate sustained attention to basic tasks for 3 minutes with Mod A multimodal cues for redirection. SLP Short Term Goal 3 - Progress (Week 9): Met SLP Short Term Goal 4 (Week 15): Week 15 - Pt will utilize increased vocal intensity w/ ModA verbal cues to remain 90% intelligibile at phrase level SLP Short Term Goal 4 - Progress (Week 15): Met    New Short Term Goals: Week 9: SLP Short Term Goal 1 (Week 16): Week 16 - Pt will perform verbal problem solving tasks with overall Min A verbal and visual cues to verbalize solutions. SLP Short Term Goal 2 (Week 16): Week 16-Patient will utilize external aids for orientation to place and time with supervision level verbal cues SLP Short Term Goal 3 (Week 16): Week 16 - Pt will demonstrate sustained attention to basic tasks for 8 minutes with Mod A multimodal cues for redirection. SLP Short Term Goal 4 (Week 16): Week 16 - Pt will utilize increased vocal intensity w/ MinA verbal cues to remain 90% intelligibile at phrase level  Weekly Progress Updates: Patient continues to make gains and has met 4 of 4 STGs this reporting period. Currently, patient demonstrates improved vocal intensity and decreased speech rate at the phrase level with Mod A verbal cues, improving  her overall intelligibility. Patient also demonstrates improved orientation to place, time and situation with Min A multimodal cues needed for use of external aids.  Patient continues to demonstrate fluctuating level of attention which can be impacted by fatigue and perseverative behaviors but overall demonstrates improved sustained attention to tasks with Mod multimodal cues.  Patient and family education ongoing. Patient would benefit from continued skilled SLP intervention to maximize her cognitive functioning and functional communication in order to reduce caregiver burden.     Intensity: Minumum of 1-2 x/day, 30 to 90 minutes Frequency: 1 to 3 out of 7 days (QD) Duration/Length of Stay: TBD due to SNF placement Treatment/Interventions: Cognitive remediation/compensation;Internal/external aids;Speech/Language facilitation;Cueing hierarchy;Environmental controls;Therapeutic Activities;Functional tasks;Multimodal communication approach;Patient/family education;Therapeutic Exercise     Aidden Markovic 03/09/2023, 6:40 AM

## 2023-03-09 NOTE — Progress Notes (Signed)
Occupational Therapy Session Note  Patient Details  Name: REEANNA TRUSTY MRN: [id number] Date of Birth: 1975-11-22  Today's Date: 03/09/2023 OT Individual Time: 0800-0830 OT Individual Time Calculation (min): 30 min    Short Term Goals: Week 5:  OT Short Term Goal 1 (Week 5): Patient will attempt to use BLE to assist with scooting or early aspect of transfer OT Short Term Goal 1 - Progress (Week 5): Other (comment) OT Short Term Goal 2 (Week 5): Patient will use right hand to pull pants over hips to aide with dressing OT Short Term Goal 2 - Progress (Week 5): Other (comment) OT Short Term Goal 3 (Week 5): Patient will feed herself 100% of meal following set up with intermittent min assist OT Short Term Goal 3 - Progress (Week 5): Other (comment) OT Short Term Goal 4 (Week 5): Patient will complete oral care after set up with min cueing OT Short Term Goal 4 - Progress (Week 5): Other (comment) OT Short Term Goal 5 (Week 5): xxxx OT Short Term Goal 5 - Progress (Week 5): Other (comment)  Skilled Therapeutic Interventions/Progress Updates:    Patient received supine in bed - awake and alert.  Patient's nurse giving medication.  Patient's brief clean and dry, and patient agreeable to get out out of bed.  Patient assisted to roll herself to right, and to transition from right sidelying to sitting.  Once sitting, patient able to scoot to bring feet toward floor with min assist. Patient does not tolerate weight thru bare feet on floor.  Patient put on her shirt without help once handed to her in correct orientation.  Patient used left hand for bimanual task of unscrewing lid on deodorant.  Patient leaning forward in chair as task requires to turn on water faucet for oral care.  Continues to show improved participation with BADL.  Left up in wheelchair with safety belt engaged and call bell/ personal items in reach.    Therapy Documentation Precautions:  Precautions Precautions: Fall Precaution  Comments: Contact precautions & delayed processing Required Braces or Orthoses: Other Brace Other Brace: B adjustable night splints; B elbow "cosey" splints; L palm protector; R mitt Restrictions Weight Bearing Restrictions: No   Pain: Pain Assessment Pain Scale: 0-10 Pain Score: Pain in left hip and foot    Therapy/Group: Individual Therapy  [name] 03/09/2023, 12:23 PM

## 2023-03-09 NOTE — Progress Notes (Signed)
Patient ID: Nicole [name], female   DOB: 10/18/1975, 47 y.o.   MRN: [id number]  Still have not heard from [facility] after multiple messages left. [name] Mom to see if can see today since working. Have also reached out to several SNF's to ask to look at pt's information and see if can accept. Do not have a bed offer as of yet, barrier is age, care needs and drug history. Will continue to search for bed

## 2023-03-09 NOTE — Progress Notes (Signed)
Speech Language Pathology Daily Session Note  Patient Details  Name: Nicole [name] MRN: [id number] Date of Birth: July 22, 1975  Today's Date: 03/09/2023 SLP Individual Time: 2952-8413 SLP Individual Time Calculation (min): 38 min  Short Term Goals: Week 9: SLP Short Term Goal 1 (Week 9): Week 16 - Pt will perform verbal problem solving tasks with overall Min A verbal and visual cues to verbalize solutions. SLP Short Term Goal 2 (Week 9): Week 16-Patient will utilize external aids for orientation to place and time with supervision level verbal cues SLP Short Term Goal 3 (Week 9): Week 16 - Pt will demonstrate sustained attention to basic tasks for 8 minutes with Mod A multimodal cues for redirection. SLP Short Term Goal 4 (Week 9): Week 16 - Pt will utilize increased vocal intensity w/ MinA verbal cues to remain 90% intelligibile at phrase level  Skilled Therapeutic Interventions: SLP conducted skilled therapy session targeting cognitive retraining and communication goals. SLP and patient reviewed speech intelligibility goals with patient still benefiting from mod assist to maintain 90% intelligibility at the phrase level. Patient sustained attention to tasks today for 3 minutes given mod multimodal cues and demonstrated increased amount of internal distractions throughout session than previous. Patient difficult to redirect with verbal cues. SLP reviewed month/date with patient requiring reorientation frequently throughout session to month specifically. Patient often perseverating that it is June despite placement of multiple visual aids in direct line of sight. Patient was left in chair with call bell in reach and chair alarm set. SLP will continue to target goals per plan of care.       Pain Pain Assessment Pain Scale: Faces Faces Pain Scale: Hurts even more Pain Location: Leg (intermittent, when moving)  Therapy/Group: Individual Therapy  [name], M.A., CCC-SLP  [name] 03/09/2023, 4:05 PM

## 2023-03-09 NOTE — Progress Notes (Signed)
PROGRESS NOTE   Subjective/Complaints: No new complaints this morning Patient's chart reviewed- No issues reported overnight Vitals signs stable    ROS: Positives per HPI above. Denies fevers, chills, N/V, abdominal pain, SOB, chest pain, joint pain, new weakness or paraesthesias.    Objective:   No results found. No results for input(s): "WBC", "HGB", "HCT", "PLT" in the last 72 hours.     Recent Labs    03/09/23 0613  CREATININE 0.77       Intake/Output Summary (Last 24 hours) at 03/09/2023 1255 Last data filed at 03/09/2023 0800 Gross per 24 hour  Intake 840 ml  Output --  Net 840 ml       Pressure Injury 02/09/23 Heel Left Deep Tissue Pressure Injury - Purple or maroon localized area of discolored intact skin or blood-filled blister due to damage of underlying soft tissue from pressure and/or shear. Black/purple and boggy,  size of a quar (Active)  02/09/23 0543  Location: Heel  Location Orientation: Left  Staging: Deep Tissue Pressure Injury - Purple or maroon localized area of discolored intact skin or blood-filled blister due to damage of underlying soft tissue from pressure and/or shear.  Wound Description (Comments): Black/purple and boggy,  size of a quarter.  Foam dressing applied  Present on Admission: No    Physical Exam: Vital Signs Blood pressure 115/79, pulse 71, temperature 98.1 F (36.7 C), resp. rate 18, height 5\' 1"  (1.549 m), weight 46.1 kg, SpO2 100%.  Gen: no distress, lying in bed eating with assistance of nursing HEENT: oral mucosa pink and moist, NCAT Cardio: Normal rate Chest: CTAB, normal effort, normal rate of breathing Abd: soft, non-distended Ext: no edema Psych: pleasant, normal affect Abd: soft, non-distended, +BS Psych: flat affect, pressured speech, irritable mood Skin: + Scaling dried skin on bilateral heels  - 2 small excoriations, sore on the base of the 5th  metatarsal head -no longer present - rash on face - no longer present - stage 2 to right toe -no longer present  + area of redness between left thumb and index finger-now minimal, purple bruise left buttock unstageable - not examined +redness to bilateral elbows -not appreciated + Left heel stage III DTI, covered in foam dressing  Neurologic: AAOx2,  to year but not month or date. Follows most simple commands.  + Hypophonation, speech intelligability ~10%  Prior exams: MAS 1 right elbow,MAS 1 in RIght finger and wrist flexors  MAS 3 knee flexors, and MAS 3 L>R  hip adductors  MAS 3 L elbow , MAS 3-4 in L wrist and finger flexors  + LUE WHO--- Hypersensitivity to left hand improved  Motor 0/5 strength in LUE and LLE, showing volitional movements in right lower extremity but difficulty to obtain MMT due to her pain/braces, able to do upper body dressing now! Exam stable 10/25.  Twice daily for right upper extremity 1/5 finger flexion, otherwise no volitional movement of left upper extremity or bilateral lowers + BL UE WHO.    Musculoskeletal: reduced bilateral shoulder and elbow ROM due to increased tone , pain with passive knee extension bilaterally   .   Assessment/Plan: 1. Functional deficits which require 3+  hours per day of interdisciplinary therapy in a comprehensive inpatient rehab setting. Physiatrist is providing close team supervision and 24 hour management of active medical problems listed below. Physiatrist and rehab team continue to assess barriers to discharge/monitor patient progress toward functional and medical goals  Care Tool:  Bathing    Body parts bathed by patient: Face, Abdomen, Chest, Left upper leg, Right upper leg   Body parts bathed by helper: Right arm, Left arm, Front perineal area, Buttocks, Left lower leg, Right lower leg Body parts n/a: Right arm, Left arm, Front perineal area, Buttocks, Right upper leg, Left upper leg, Right lower leg, Left lower  leg   Bathing assist Assist Level: Maximal Assistance - Patient 24 - 49%     Upper Body Dressing/Undressing Upper body dressing   What is the patient wearing?: Pull over shirt    Upper body assist Assist Level: Moderate Assistance - Patient 50 - 74%    Lower Body Dressing/Undressing Lower body dressing      What is the patient wearing?: Pants, Incontinence brief     Lower body assist Assist for lower body dressing: Total Assistance - Patient < 25%     Toileting Toileting Toileting Activity did not occur Press photographer and hygiene only): N/A (no void or bm)  Toileting assist Assist for toileting: Dependent - Patient 0%     Transfers Chair/bed transfer  Transfers assist  Chair/bed transfer activity did not occur: Safety/medical concerns  Chair/bed transfer assist level: Dependent - Patient 0%     Locomotion Ambulation   Ambulation assist   Ambulation activity did not occur: Safety/medical concerns  Assist level: 2 helpers   Max distance: 58ft   Walk 10 feet activity   Assist  Walk 10 feet activity did not occur: Safety/medical concerns  Assist level: 2 helpers     Walk 50 feet activity   Assist Walk 50 feet with 2 turns activity did not occur: Safety/medical concerns         Walk 150 feet activity   Assist Walk 150 feet activity did not occur: Safety/medical concerns         Walk 10 feet on uneven surface  activity   Assist Walk 10 feet on uneven surfaces activity did not occur: Safety/medical concerns         Wheelchair     Assist Is the patient using a wheelchair?: Yes Type of Wheelchair: Manual    Wheelchair assist level: Dependent - Patient 0% Max wheelchair distance: 26ft    Wheelchair 50 feet with 2 turns activity    Assist    Wheelchair 50 feet with 2 turns activity did not occur: Safety/medical concerns   Assist Level: Dependent - Patient 0%   Wheelchair 150 feet activity     Assist   Wheelchair 150 feet activity did not occur: Safety/medical concerns   Assist Level: Dependent - Patient 0%   Blood pressure 115/79, pulse 71, temperature 98.1 F (36.7 C), resp. rate 18, height 5\' 1"  (1.549 m), weight 46.1 kg, SpO2 100%.  Medical Problem List and Plan: 1. 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 Code status  DNR Continue CIR therapies including PT, OT SLP Team conference 10/23   2.  Impaired mobility: continue Lovenox, weekly creatinine ordered             -antiplatelet therapy: N/A  3. Knee paint: Tylenol as needed, continue voltaren gel scheduled.   XR reviewed and is stable, discussed with therapy  4. History of anxiety: discussed with her mom that she feels this was a big risk factor for her accident, propanolol ordered.   5. Neuropsych/cognition: This patient is not capable of making decisions on her own behalf  - 10/12: Still not capable of medical decision making; BUT oriented and communicating, following directions - MUCH improved .Will add PRN melatonin 3mg  10/20 for insomnia 6. Left buttock unstageable ulcer.  continue Medihoney to buttock wound daily cover with foam dressing.  Changing dressing every 3 days or as needed soiling   7. Fluids/Electrolytes/Nutrition: Routine in and outs with follow-up chemistries  -Eating 100% most meals with assist and encouragement  8.  Cavitary right lower lobe pneumonia likely aspiration pneumonia/MRSA pneumonia.  Resolved    Latest  Ref Rng & Units 02/05/2023   11:12 AM 01/22/2023    6:36 AM 12/20/2022   10:54 AM  CBC  WBC 4.0 - 10.5 K/uL 8.5  8.1  5.6   Hemoglobin 12.0 - 15.0 g/dL 30.8  65.7  84.6   Hematocrit 36.0 - 46.0 % 46.7  47.5  42.8   Platelets 150 - 400 K/uL 284  274  253      9.  History of drug abuse.  Positive cocaine on urine drug screen.  Provide counseling  10.  AKI/hypovolemia and ATN.  Resolved Appears to have fair fluid intake, creatinine has been stable    Latest Ref Rng & Units 03/09/2023    6:13 AM 03/02/2023    7:24 AM 02/23/2023   10:37 AM  BMP  Creatinine 0.44 - 1.00 mg/dL 9.62  9.52  8.41     11.  Mild transaminitis with rhabdomyolysis.  Both resolved  12.  Hypotension. Resolved, d/c midodrine  -BP stable    13. Impaired initiation: continue amantadine 100mg  BID  14. Hyperlycemia: CBGs mildly elevated, d/ced checks  15. Spasticity:   -discussed serial casting with OT and her mom  - continue baclofen to 20mg  TID and tizanidine to 4mg  TID. Continue wrist, elbow brace, and PRAFOs. Ordered a night splint for LLE  Repeat CMP today given chronic tizanidine use  -9/7 continues to have significant tone despite use of tizanidine and baclofen, may be beneficial consider baclofen pump in outpatient setting if this causes significant difficulties in care  9/8 consider decrease baclofen if dyskinetic movements continue  10/5-6  continue orthotics, ROM, positioning  when possible 16. Bowel and bladder incontinence: continue bowel and bladder program  17. Left hand skin maceration: Eucerin ordered, discussed hand split with OT, conitnue  18. History of magnesium deficiency: magnesium 500mg  started HS  19. HTN: see above Increase tizanidine to 6mg  TID. Magnesium supplement added HS. Increase propanolol to 20mg  TID, continue this dose  -10/ 20 BP stable, continue current regimen    03/09/2023    4:19 AM 03/08/2023    7:14 PM 03/08/2023    1:45 PM  Vitals with BMI  Systolic 115 90 92   Diastolic 79 68 65  Pulse  71 77 85     20. Area of redness between left thumb and index finger: continue foam dressing  21. Scalp eczema: selsun blue ordered, continue  -9/7 patient's mother feels like this is causing her to itch, will start some hydrocortisone cream  22. Fatigue: improved, discussed with team may be secondary to anti-spasticity medications, B12 reviewed and is 335, in suboptimal range, will give 1,000U B12 injection 8/7, will order monthly as needed, metanx started, increase to BID, continue  23. Right wrist swelling: XR ordered and shows no acute fracture, shows 2.13mm ulnar variance, brace removed to given patient a break from it. Voltaren gel ordered prn for discomfort, continue, CT ordered and show no acute osseus injury, continue ace wrap  23. Dysphagia: continue D3/thins, d/c ensure as blood pressure severely elevates whenever restarted  24. MRSA nares positive: negative on repeat, precautions d/ced  25. Tachycardic: resolved, d/c propanolol.  continueTizanidine increased to 6mg  TID. Resolved, amantadine increased back to 100mg  BID, continue this dose  26.  Dyskinetic movements in head and neck - appears to be myoclonic jerks related to hypoxic brain injury - as discussed with pt mom, no specific meds for this on antispasticity meds , consioder benzo although with pt's SUD would avoid this class  EEG reviewed and was negative for seizure.  Decrease baclofen to 15mg  TID  27. Small sore on the base of 5th metatarsal head: local wound care, continue  28. Rash on face: resolved, discussed with mother could have been secondary to added sugars in nutritional supplements  29. Sacral pressure injury: air mattress ordered, Placed nursing order to offload q1 hour while awake, continue medihoney, wound care consulted, discussed with nursing that this is improving  30. Cervical extensor weakness: tried decreasing tizanidine but spasticity appears increased and BP elevated  so will increase back to 6mg  TID, continue this dose  31. Low protein stores: prosource ordered, continue  32. Intermittent lethargy: labs reviewed and are stable, discussed with team may be secondary to anti-spasticity medications  33. Blood blister to left heel: continue foam heel protectors.  34. Suboptimal vitamin D level: increase to 5,000 U daily. continue this dose  35. Hypotension: resolved, decrease propanolol to daily. decrease baclofen to 15 mg TID, continue this dose  26. Foot pain: tramadol ordered prn, improved today, continue prn  27. S/p fall: CT head reviewed and is stable  28. Tachycardia: decrease propanolol to 10mg  daily, resolved.    LOS: 111 days A FACE TO FACE EVALUATION WAS PERFORMED  Drema Pry Brienne Liguori 03/09/2023, 12:55 PM

## 2023-03-10 NOTE — Progress Notes (Signed)
PROGRESS NOTE   Subjective/Complaints: No new complaints this morning Patient's chart reviewed- No issues reported overnight Vitals signs stable    ROS: Positives per HPI above. Denies fevers, chills, N/V, abdominal pain, SOB, chest pain, joint pain, new weakness or paraesthesias.    Objective:   No results found. No results for input(s): "WBC", "HGB", "HCT", "PLT" in the last 72 hours.     Recent Labs    03/09/23 0613  CREATININE 0.77       Intake/Output Summary (Last 24 hours) at 03/10/2023 1956 Last data filed at 03/10/2023 1802 Gross per 24 hour  Intake 480 ml  Output --  Net 480 ml       Pressure Injury 02/09/23 Heel Left Deep Tissue Pressure Injury - Purple or maroon localized area of discolored intact skin or blood-filled blister due to damage of underlying soft tissue from pressure and/or shear. Black/purple and boggy,  size of a quar (Active)  02/09/23 0543  Location: Heel  Location Orientation: Left  Staging: Deep Tissue Pressure Injury - Purple or maroon localized area of discolored intact skin or blood-filled blister due to damage of underlying soft tissue from pressure and/or shear.  Wound Description (Comments): Black/purple and boggy,  size of a quarter.  Foam dressing applied  Present on Admission: No    Physical Exam: Vital Signs Blood pressure 96/62, pulse 79, temperature (!) 97.5 F (36.4 C), temperature source Oral, resp. rate 16, height 5\' 1"  (1.549 m), weight 46.1 kg, SpO2 98%.  Gen: no distress, lying in bed eating with assistance of nursing HEENT: oral mucosa pink and moist, NCAT Cardio: Normal rate Chest: CTAB, normal effort, normal rate of breathing Abd: soft, non-distended Ext: no edema Psych: pleasant, normal affect Abd: soft, non-distended, +BS Psych: flat affect, pressured speech, irritable mood Skin: + Scaling dried skin on bilateral heels  - 2 small excoriations,  sore on the base of the 5th metatarsal head -no longer present - rash on face - no longer present - stage 2 to right toe -no longer present  + area of redness between left thumb and index finger-now minimal, purple bruise left buttock unstageable - not examined +redness to bilateral elbows -not appreciated + Left heel stage III DTI, covered in foam dressing  Neurologic: AAOx2,  to year but not month or date. Follows most simple commands.  + Hypophonation, speech intelligability ~10%  Prior exams: MAS 1 right elbow,MAS 1 in RIght finger and wrist flexors  MAS 3 knee flexors, and MAS 3 L>R  hip adductors  MAS 3 L elbow , MAS 3-4 in L wrist and finger flexors  + LUE WHO--- Hypersensitivity to left hand improved  Motor 0/5 strength in LUE and LLE, showing volitional movements in right lower extremity but difficulty to obtain MMT due to her pain/braces, able to do upper body dressing now! Exam stable 10/26  Twice daily for right upper extremity 1/5 finger flexion, otherwise no volitional movement of left upper extremity or bilateral lowers + BL UE WHO.    Musculoskeletal: reduced bilateral shoulder and elbow ROM due to increased tone , pain with passive knee extension bilaterally   .   Assessment/Plan: 1. Functional  deficits which require 3+ hours per day of interdisciplinary therapy in a comprehensive inpatient rehab setting. Physiatrist is providing close team supervision and 24 hour management of active medical problems listed below. Physiatrist and rehab team continue to assess barriers to discharge/monitor patient progress toward functional and medical goals  Care Tool:  Bathing    Body parts bathed by patient: Face, Abdomen, Chest, Left upper leg, Right upper leg   Body parts bathed by helper: Right arm, Left arm, Front perineal area, Buttocks, Left lower leg, Right lower leg Body parts n/a: Right arm, Left arm, Front perineal area, Buttocks, Right upper leg, Left upper leg,  Right lower leg, Left lower leg   Bathing assist Assist Level: Maximal Assistance - Patient 24 - 49%     Upper Body Dressing/Undressing Upper body dressing   What is the patient wearing?: Pull over shirt    Upper body assist Assist Level: Moderate Assistance - Patient 50 - 74%    Lower Body Dressing/Undressing Lower body dressing      What is the patient wearing?: Pants, Incontinence brief     Lower body assist Assist for lower body dressing: Total Assistance - Patient < 25%     Toileting Toileting Toileting Activity did not occur Press photographer and hygiene only): N/A (no void or bm)  Toileting assist Assist for toileting: Dependent - Patient 0%     Transfers Chair/bed transfer  Transfers assist  Chair/bed transfer activity did not occur: Safety/medical concerns  Chair/bed transfer assist level: Dependent - Patient 0%     Locomotion Ambulation   Ambulation assist   Ambulation activity did not occur: Safety/medical concerns  Assist level: 2 helpers   Max distance: 40ft   Walk 10 feet activity   Assist  Walk 10 feet activity did not occur: Safety/medical concerns  Assist level: 2 helpers     Walk 50 feet activity   Assist Walk 50 feet with 2 turns activity did not occur: Safety/medical concerns         Walk 150 feet activity   Assist Walk 150 feet activity did not occur: Safety/medical concerns         Walk 10 feet on uneven surface  activity   Assist Walk 10 feet on uneven surfaces activity did not occur: Safety/medical concerns         Wheelchair     Assist Is the patient using a wheelchair?: Yes Type of Wheelchair: Manual    Wheelchair assist level: Dependent - Patient 0% Max wheelchair distance: 34ft    Wheelchair 50 feet with 2 turns activity    Assist    Wheelchair 50 feet with 2 turns activity did not occur: Safety/medical concerns   Assist Level: Dependent - Patient 0%   Wheelchair 150 feet  activity     Assist  Wheelchair 150 feet activity did not occur: Safety/medical concerns   Assist Level: Dependent - Patient 0%   Blood pressure 96/62, pulse 79, temperature (!) 97.5 F (36.4 C), temperature source Oral, resp. rate 16, height 5\' 1"  (1.549 m), weight 46.1 kg, SpO2 98%.  Medical Problem List and Plan: 1. 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 Code status  DNR Continue CIR therapies including PT, OT SLP Team conference 10/23   2.  Impaired mobility: continue Lovenox, weekly creatinine ordered             -antiplatelet therapy: N/A  3. Knee paint: Tylenol as needed, continue voltaren gel scheduled.   XR reviewed and is stable, discussed with therapy  4. History of anxiety: discussed with her mom that she feels this was a big risk factor for her accident, propanolol ordered.   5. Neuropsych/cognition: This patient is not capable of making decisions on her own behalf  - 10/12: Still not capable of medical decision making; BUT oriented and communicating, following directions - MUCH improved .Will add PRN melatonin 3mg  10/20 for insomnia 6. Left buttock unstageable ulcer.  continue Medihoney to buttock wound daily cover with foam dressing.  Changing dressing every 3 days or as needed soiling   7. Fluids/Electrolytes/Nutrition: Routine in and outs with follow-up chemistries  -Eating 100% most meals with assist and encouragement  8.  Cavitary right lower lobe pneumonia likely  aspiration pneumonia/MRSA pneumonia.  Resolved    Latest Ref Rng & Units 02/05/2023   11:12 AM 01/22/2023    6:36 AM 12/20/2022   10:54 AM  CBC  WBC 4.0 - 10.5 K/uL 8.5  8.1  5.6   Hemoglobin 12.0 - 15.0 g/dL 16.1  09.6  04.5   Hematocrit 36.0 - 46.0 % 46.7  47.5  42.8   Platelets 150 - 400 K/uL 284  274  253      9.  History of drug abuse.  Positive cocaine on urine drug screen.  Provide counseling  10.  AKI/hypovolemia and ATN.  Resolved Appears to have fair fluid intake, creatinine has been stable    Latest Ref Rng & Units 03/09/2023    6:13 AM 03/02/2023    7:24 AM 02/23/2023   10:37 AM  BMP  Creatinine 0.44 - 1.00 mg/dL 4.09  8.11  9.14     11.  Mild transaminitis with rhabdomyolysis.  Both resolved  12.  Hypotension. Resolved, d/c midodrine  -BP stable    13. Impaired initiation: continue amantadine 100mg  BID  14. Hyperlycemia: CBGs mildly elevated, d/ced checks  15. Spasticity:   -discussed serial casting with OT and her mom  - continue baclofen to 20mg  TID and tizanidine to 4mg  TID. Continue wrist, elbow brace, and PRAFOs. Ordered a night splint for LLE  Repeat CMP today given chronic tizanidine use  -9/7 continues to have significant tone despite use of tizanidine and baclofen, may be beneficial consider baclofen pump in outpatient setting if this causes significant difficulties in care  9/8 consider decrease baclofen if dyskinetic movements continue  10/5-6  continue orthotics, ROM, positioning  when possible 16. Bowel and bladder incontinence: continue bowel and bladder program  17. Left hand skin maceration: Eucerin ordered, discussed hand split with OT, conitnue  18. History of magnesium deficiency: magnesium 500mg  started HS  19. HTN: see above Increase tizanidine to 6mg  TID. Magnesium supplement added HS. Increase propanolol to 20mg  TID, continue this dose  -10/ 20 BP stable, continue current regimen    03/10/2023    1:06 PM 03/10/2023    9:37 AM  03/10/2023    3:15 AM  Vitals with BMI  Systolic 96 104 106  Diastolic 62 73 69  Pulse 79 88 79     20. Area of redness between left thumb and index finger: continue foam dressing  21. Scalp eczema: selsun blue ordered, continue  -9/7 patient's mother feels like this is causing her to itch, will start some hydrocortisone cream  22. Fatigue: improved, discussed with team may be secondary to anti-spasticity medications, B12 reviewed and is 335, in suboptimal range, will give 1,000U B12 injection 8/7, will order monthly as needed, metanx started, increase to BID, continue  23. Right wrist swelling: XR ordered and shows no acute fracture, shows 2.79mm ulnar variance, brace removed to given patient a break from it. Voltaren gel ordered prn for discomfort, continue, CT ordered and show no acute osseus injury, continue ace wrap  23. Dysphagia: continue D3/thins, d/c ensure as blood pressure severely elevates whenever restarted  24. MRSA nares positive: negative on repeat, precautions d/ced  25. Tachycardic: resolved, d/c propanolol.  continueTizanidine increased to 6mg  TID. Resolved, amantadine increased back to 100mg  BID, continue this dose  26.  Dyskinetic movements in head and neck - appears to be myoclonic jerks related to hypoxic brain injury - as discussed with pt mom, no specific meds for this on antispasticity meds , consioder benzo although with pt's SUD would avoid this class  EEG reviewed and was negative for seizure.  Decrease baclofen to 15mg  TID  27. Small sore on the base of 5th metatarsal head: local wound care, continue  28. Rash on face: resolved, discussed with mother could have been secondary to added sugars in nutritional supplements  29. Sacral pressure injury: air mattress ordered, Placed nursing order to offload q1 hour while awake, continue medihoney, wound care consulted, discussed with nursing that this is improving  30. Cervical extensor weakness: tried  decreasing tizanidine but spasticity appears increased and BP elevated so will increase back to 6mg  TID, continue this dose, continue cervical collar  31. Low protein stores: prosource ordered, continue  32. Intermittent lethargy: labs reviewed and are stable, discussed with team may be secondary to anti-spasticity medications  33. Blood blister to left heel: continue foam heel protectors.  34. Suboptimal vitamin D level: increase to 5,000 U daily. continue this dose  35. Hypotension: resolved, decrease propanolol to daily. decrease baclofen to 15 mg TID, continue this dose  26. Foot pain: tramadol ordered prn, improved today, continue prn  27. S/p fall: CT head reviewed and is stable  28. Tachycardia: decrease propanolol to 10mg  daily, resolved.      LOS: 112 days A FACE TO FACE EVALUATION WAS PERFORMED  Horton Chin 03/10/2023, 7:56 PM

## 2023-03-11 NOTE — Progress Notes (Signed)
PROGRESS NOTE   Subjective/Complaints: No new complaints this morning "I'm good" [place] RN is giving her water to drink No issues overnight   ROS: Positives per HPI above. Denies fevers, chills, N/V, abdominal pain, SOB, chest pain, joint pain, new weakness or paraesthesias.    Objective:   No results found. No results for input(s): "WBC", "HGB", "HCT", "PLT" in the last 72 hours.     Recent Labs    03/09/23 0613  CREATININE 0.77       Intake/Output Summary (Last 24 hours) at 03/11/2023 1322 Last data filed at 03/11/2023 0840 Gross per 24 hour  Intake 460 ml  Output --  Net 460 ml       Pressure Injury 02/09/23 Heel Left Deep Tissue Pressure Injury - Purple or maroon localized area of discolored intact skin or blood-filled blister due to damage of underlying soft tissue from pressure and/or shear. Black/purple and boggy,  size of a quar (Active)  02/09/23 0543  Location: Heel  Location Orientation: Left  Staging: Deep Tissue Pressure Injury - Purple or maroon localized area of discolored intact skin or blood-filled blister due to damage of underlying soft tissue from pressure and/or shear.  Wound Description (Comments): Black/purple and boggy,  size of a quarter.  Foam dressing applied  Present on Admission: No    Physical Exam: Vital Signs Blood pressure 102/75, pulse 77, temperature (!) 97.5 F (36.4 C), temperature source Oral, resp. rate 18, height 5\' 1"  (1.549 m), weight 46.1 kg, SpO2 97%.  Gen: no distress, lying in bed eating with assistance of nursing HEENT: oral mucosa pink and moist, NCAT Cardio: Normal rate Chest: CTAB, normal effort, normal rate of breathing Abd: soft, non-distended Ext: no edema Psych: pleasant, normal affect Abd: soft, non-distended, +BS Psych: flat affect, pressured speech, irritable mood Skin: + Scaling dried skin on bilateral heels  - 2 small excoriations, sore  on the base of the 5th metatarsal head -no longer present - rash on face - no longer present - stage 2 to right toe -no longer present  + area of redness between left thumb and index finger-now minimal, purple bruise left buttock unstageable - not examined +redness to bilateral elbows -not appreciated + Left heel stage III DTI, covered in foam dressing  Neurologic: AAOx2,  to year but not month or date. Follows most simple commands.  + Hypophonation, speech intelligability ~10%  Prior exams: MAS 1 right elbow,MAS 1 in RIght finger and wrist flexors  MAS 3 knee flexors, and MAS 3 L>R  hip adductors  MAS 3 L elbow , MAS 3-4 in L wrist and finger flexors  + LUE WHO--- Hypersensitivity to left hand improved  Motor 0/5 strength in LUE and LLE, showing volitional movements in right lower extremity but difficulty to obtain MMT due to her pain/braces, able to do upper body dressing now! Exam stable 10/27  Twice daily for right upper extremity 1/5 finger flexion, otherwise no volitional movement of left upper extremity or bilateral lowers + BL UE WHO.    Musculoskeletal: reduced bilateral shoulder and elbow ROM due to increased tone , pain with passive knee extension bilaterally   .   Assessment/Plan:  1. Functional deficits which require 3+ hours per day of interdisciplinary therapy in a comprehensive inpatient rehab setting. Physiatrist is providing close team supervision and 24 hour management of active medical problems listed below. Physiatrist and rehab team continue to assess barriers to discharge/monitor patient progress toward functional and medical goals  Care Tool:  Bathing    Body parts bathed by patient: Face, Abdomen, Chest, Left upper leg, Right upper leg   Body parts bathed by helper: Right arm, Left arm, Front perineal area, Buttocks, Left lower leg, Right lower leg Body parts n/a: Right arm, Left arm, Front perineal area, Buttocks, Right upper leg, Left upper leg, Right  lower leg, Left lower leg   Bathing assist Assist Level: Maximal Assistance - Patient 24 - 49%     Upper Body Dressing/Undressing Upper body dressing   What is the patient wearing?: Pull over shirt    Upper body assist Assist Level: Moderate Assistance - Patient 50 - 74%    Lower Body Dressing/Undressing Lower body dressing      What is the patient wearing?: Pants, Incontinence brief     Lower body assist Assist for lower body dressing: Total Assistance - Patient < 25%     Toileting Toileting Toileting Activity did not occur Press photographer and hygiene only): N/A (no void or bm)  Toileting assist Assist for toileting: Dependent - Patient 0%     Transfers Chair/bed transfer  Transfers assist  Chair/bed transfer activity did not occur: Safety/medical concerns  Chair/bed transfer assist level: Dependent - Patient 0%     Locomotion Ambulation   Ambulation assist   Ambulation activity did not occur: Safety/medical concerns  Assist level: 2 helpers   Max distance: 80ft   Walk 10 feet activity   Assist  Walk 10 feet activity did not occur: Safety/medical concerns  Assist level: 2 helpers     Walk 50 feet activity   Assist Walk 50 feet with 2 turns activity did not occur: Safety/medical concerns         Walk 150 feet activity   Assist Walk 150 feet activity did not occur: Safety/medical concerns         Walk 10 feet on uneven surface  activity   Assist Walk 10 feet on uneven surfaces activity did not occur: Safety/medical concerns         Wheelchair     Assist Is the patient using a wheelchair?: Yes Type of Wheelchair: Manual    Wheelchair assist level: Dependent - Patient 0% Max wheelchair distance: 66ft    Wheelchair 50 feet with 2 turns activity    Assist    Wheelchair 50 feet with 2 turns activity did not occur: Safety/medical concerns   Assist Level: Dependent - Patient 0%   Wheelchair 150 feet activity      Assist  Wheelchair 150 feet activity did not occur: Safety/medical concerns   Assist Level: Dependent - Patient 0%   Blood pressure 102/75, pulse 77, temperature (!) 97.5 F (36.4 C), temperature source Oral, resp. rate 18, height 5\' 1"  (1.549 m), weight 46.1 kg, SpO2 97%.  Medical Problem List and Plan: 1. 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 Code status  DNR Continue CIR therapies including PT, OT SLP Team conference 10/23   2.  Impaired mobility: continue Lovenox, weekly creatinine ordered             -antiplatelet therapy: N/A  3. Knee paint: Tylenol as needed, continue voltaren gel scheduled.   XR reviewed and is stable, discussed with therapy  4. History of anxiety: discussed with her mom that she feels this was a big risk factor for her accident, propanolol ordered.   5. Neuropsych/cognition: This patient is not capable of making decisions on her own behalf  - 10/12: Still not capable of medical decision making; BUT oriented and communicating, following directions - MUCH improved .Will add PRN melatonin 3mg  10/20 for insomnia 6. Left buttock unstageable ulcer.  Continue Medihoney to buttock wound daily cover with foam dressing.  Changing dressing every 3 days or as needed soiling   7. Fluids/Electrolytes/Nutrition: Routine in and outs with follow-up chemistries  -Eating 100% most meals with assist and encouragement  8.  Cavitary right lower lobe pneumonia likely aspiration  pneumonia/MRSA pneumonia.  Resolved    Latest Ref Rng & Units 02/05/2023   11:12 AM 01/22/2023    6:36 AM 12/20/2022   10:54 AM  CBC  WBC 4.0 - 10.5 K/uL 8.5  8.1  5.6   Hemoglobin 12.0 - 15.0 g/dL 36.6  44.0  34.7   Hematocrit 36.0 - 46.0 % 46.7  47.5  42.8   Platelets 150 - 400 K/uL 284  274  253      9.  History of drug abuse.  Positive cocaine on urine drug screen.  Provide counseling  10.  AKI/hypovolemia and ATN.  Resolved Appears to have fair fluid intake, creatinine has been stable    Latest Ref Rng & Units 03/09/2023    6:13 AM 03/02/2023    7:24 AM 02/23/2023   10:37 AM  BMP  Creatinine 0.44 - 1.00 mg/dL 4.25  9.56  3.87     11.  Mild transaminitis with rhabdomyolysis.  Both resolved  12.  Hypotension. Resolved, d/c midodrine  -BP stable    13. Impaired initiation: continue amantadine 100mg  BID  14. Hyperlycemia: CBGs mildly elevated, d/ced checks  15. Spasticity:   -discussed serial casting with OT and her mom  - continue baclofen to 20mg  TID and tizanidine to 4mg  TID. Continue wrist, elbow brace, and PRAFOs. Ordered a night splint for LLE  Repeat CMP today given chronic tizanidine use  -9/7 continues to have significant tone despite use of tizanidine and baclofen, may be beneficial consider baclofen pump in outpatient setting if this causes significant difficulties in care  9/8 consider decrease baclofen if dyskinetic movements continue  10/5-6  continue orthotics, ROM, positioning  when possible 16. Bowel and bladder incontinence: continue bowel and bladder program  17. Left hand skin maceration: Eucerin ordered, discussed hand split with OT, conitnue  18. History of magnesium deficiency: magnesium 500mg  started HS  19. HTN: see above Increase tizanidine to 6mg  TID. Magnesium supplement added HS. Increase propanolol to 20mg  TID, continue this dose  -10/ 20 BP stable, continue current regimen    03/11/2023    5:28 AM 03/10/2023    8:31 PM 03/10/2023     1:06 PM  Vitals with BMI  Systolic 102 105 96  Diastolic 75 70 62  Pulse 77 94 79     20. Area of redness between left thumb and index finger: continue foam dressing  21. Scalp eczema: selsun blue ordered, continue  -9/7 patient's mother feels like this is causing her to itch, will start some hydrocortisone cream  22. Fatigue: improved, discussed with team may be secondary to anti-spasticity medications, B12 reviewed and is 335, in suboptimal range, will give 1,000U B12 injection 8/7, will order monthly as needed, metanx started, increase to BID, continue  23. Right wrist swelling: XR ordered and shows no acute fracture, shows 2.67mm ulnar variance, brace removed to given patient a break from it. Voltaren gel ordered prn for discomfort, continue, CT ordered and show no acute osseus injury, continue ace wrap  23. Dysphagia: continue D3/thins, d/c ensure as blood pressure severely elevates whenever restarted  24. MRSA nares positive: negative on repeat, precautions d/ced  25. Tachycardic: resolved, d/c propanolol.  continueTizanidine increased to 6mg  TID. Resolved, amantadine increased back to 100mg  BID, continue this dose  26.  Dyskinetic movements in head and neck - appears to be myoclonic jerks related to hypoxic brain injury - as discussed with pt mom, no specific meds for this on antispasticity meds , consioder benzo although with pt's SUD would avoid this class  EEG reviewed and was negative for seizure.  Decrease baclofen to 15mg  TID  27. Small sore on the base of 5th metatarsal head: local wound care, continue  28. Rash on face: resolved, discussed with mother could have been secondary to added sugars in nutritional supplements  29. Sacral pressure injury: air mattress ordered, Placed nursing order to offload q1 hour while awake, continue medihoney, wound care consulted, discussed with nursing that this is improving  30. Cervical extensor weakness: tried decreasing tizanidine  but spasticity appears increased and BP elevated so will increase back to 6mg  TID, continue this dose, continue cervical collar  31. Low protein stores: prosource ordered, continue  32. Intermittent lethargy: labs reviewed and are stable, discussed with team may be secondary to anti-spasticity medications  33. Blood blister to left heel: continue foam heel protectors.  34. Suboptimal vitamin D level: increase to 5,000 U daily. continue this dose  35. Hypotension: resolved, decrease propanolol to daily. decrease baclofen to 15 mg TID, continue this dose  26. Foot pain: tramadol ordered prn, improved today, continue prn  27. S/p fall: CT head reviewed and is stable  28. Tachycardia: decrease propanolol to 10mg  daily, resolved.      LOS: 113 days A FACE TO FACE EVALUATION WAS PERFORMED  Einer Meals P Maira Christon 03/11/2023, 1:22 PM

## 2023-03-12 DIAGNOSIS — R1312 Dysphagia, oropharyngeal phase: Secondary | ICD-10-CM

## 2023-03-12 NOTE — Progress Notes (Signed)
PROGRESS NOTE   Subjective/Complaints: No new issues overnight.   ROS: Patient denies fever, rash, sore throat, blurred vision, dizziness, nausea, vomiting, diarrhea, cough, shortness of breath or chest pain, joint or back/neck pain, headache .   Objective:   No results found. No results for input(s): "WBC", "HGB", "HCT", "PLT" in the last 72 hours.     No results for input(s): "NA", "K", "CL", "CO2", "GLUCOSE", "BUN", "CREATININE", "CALCIUM" in the last 72 hours.      Intake/Output Summary (Last 24 hours) at 03/12/2023 0953 Last data filed at 03/12/2023 0750 Gross per 24 hour  Intake 357 ml  Output --  Net 357 ml       Pressure Injury 02/09/23 Heel Left Deep Tissue Pressure Injury - Purple or maroon localized area of discolored intact skin or blood-filled blister due to damage of underlying soft tissue from pressure and/or shear. Black/purple and boggy,  size of a quar (Active)  02/09/23 0543  Location: Heel  Location Orientation: Left  Staging: Deep Tissue Pressure Injury - Purple or maroon localized area of discolored intact skin or blood-filled blister due to damage of underlying soft tissue from pressure and/or shear.  Wound Description (Comments): Black/purple and boggy,  size of a quarter.  Foam dressing applied  Present on Admission: No    Physical Exam: Vital Signs Blood pressure 97/63, pulse 75, temperature 97.6 F (36.4 C), temperature source Oral, resp. rate 18, height 5\' 1"  (1.549 m), weight 46.1 kg, SpO2 99%.  Constitutional: No distress . Vital signs reviewed. HEENT: NCAT, EOMI, oral membranes moist Neck: supple Cardiovascular: RRR without murmur. No JVD    Respiratory/Chest: CTA Bilaterally without wheezes or rales. Normal effort    GI/Abdomen: BS +, non-tender, non-distended Ext: no clubbing, cyanosis, or edema Psych: flat  Skin: + Scaling dried skin on bilateral heels  - 2 small  excoriations, sore on the base of the 5th metatarsal head -no longer present - rash on face - no longer present - stage 2 to right toe -no longer present  + area of redness between left thumb and index finger-now minimal, purple bruise left buttock unstageable - not examined +redness to bilateral elbows -not appreciated + Left heel stage III DTI, covered in foam dressing  Neurologic: AAOx2,  to year but not month or date. Follows most simple commands.  + Hypophonation, speech intelligability ~10%--no changes 10/28  Prior exams: MAS 1 right elbow,MAS 1 in RIght finger and wrist flexors  MAS 3 knee flexors, and MAS 3 L>R  hip adductors  MAS 3 L elbow , MAS 3-4 in L wrist and finger flexors  + LUE WHO--- Hypersensitivity to left hand improved  Motor 0/5 strength in LUE and LLE, showing volitional movements in right lower extremity but difficulty to obtain MMT due to her pain/braces, able to do upper body dressing now! Exam stable 10/27  Twice daily for right upper extremity 1/5 finger flexion, otherwise no volitional movement of left upper extremity or bilateral lowers + BL UE WHO.    Musculoskeletal: reduced bilateral shoulder and elbow ROM due to increased tone , pain with passive knee extension bilaterally   .   Assessment/Plan: 1. Functional  deficits which require 3+ hours per day of interdisciplinary therapy in a comprehensive inpatient rehab setting. Physiatrist is providing close team supervision and 24 hour management of active medical problems listed below. Physiatrist and rehab team continue to assess barriers to discharge/monitor patient progress toward functional and medical goals  Care Tool:  Bathing    Body parts bathed by patient: Face, Abdomen, Chest, Left upper leg, Right upper leg   Body parts bathed by helper: Right arm, Left arm, Front perineal area, Buttocks, Left lower leg, Right lower leg Body parts n/a: Right arm, Left arm, Front perineal area, Buttocks,  Right upper leg, Left upper leg, Right lower leg, Left lower leg   Bathing assist Assist Level: Maximal Assistance - Patient 24 - 49%     Upper Body Dressing/Undressing Upper body dressing   What is the patient wearing?: Pull over shirt    Upper body assist Assist Level: Moderate Assistance - Patient 50 - 74%    Lower Body Dressing/Undressing Lower body dressing      What is the patient wearing?: Pants, Incontinence brief     Lower body assist Assist for lower body dressing: Total Assistance - Patient < 25%     Toileting Toileting Toileting Activity did not occur Press photographer and hygiene only): N/A (no void or bm)  Toileting assist Assist for toileting: Dependent - Patient 0%     Transfers Chair/bed transfer  Transfers assist  Chair/bed transfer activity did not occur: Safety/medical concerns  Chair/bed transfer assist level: Dependent - Patient 0%     Locomotion Ambulation   Ambulation assist   Ambulation activity did not occur: Safety/medical concerns  Assist level: 2 helpers   Max distance: 49ft   Walk 10 feet activity   Assist  Walk 10 feet activity did not occur: Safety/medical concerns  Assist level: 2 helpers     Walk 50 feet activity   Assist Walk 50 feet with 2 turns activity did not occur: Safety/medical concerns         Walk 150 feet activity   Assist Walk 150 feet activity did not occur: Safety/medical concerns         Walk 10 feet on uneven surface  activity   Assist Walk 10 feet on uneven surfaces activity did not occur: Safety/medical concerns         Wheelchair     Assist Is the patient using a wheelchair?: Yes Type of Wheelchair: Manual    Wheelchair assist level: Dependent - Patient 0% Max wheelchair distance: 1ft    Wheelchair 50 feet with 2 turns activity    Assist    Wheelchair 50 feet with 2 turns activity did not occur: Safety/medical concerns   Assist Level: Dependent - Patient  0%   Wheelchair 150 feet activity     Assist  Wheelchair 150 feet activity did not occur: Safety/medical concerns   Assist Level: Dependent - Patient 0%   Blood pressure 97/63, pulse 75, temperature 97.6 F (36.4 C), temperature source Oral, resp. rate 18, height 5\' 1"  (1.549 m), weight 46.1 kg, SpO2 99%.  Medical Problem List and Plan: 1. 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 Code status  DNR -Continue CIR therapies including PT, OT, and SLP  Team conference 10/23   2.  Impaired mobility: continue Lovenox, weekly creatinine ordered             -antiplatelet therapy: N/A  3. Knee paint: Tylenol as needed, continue voltaren gel scheduled.   XR reviewed and is stable, discussed with therapy  4. History of anxiety: discussed with her mom that she feels this was a big risk factor for her accident, propanolol ordered.   5. Neuropsych/cognition: This patient is not capable of making decisions on her own behalf  - 10/12: Still not capable of medical decision making; BUT oriented and communicating, following directions - MUCH improved .Will add PRN melatonin 3mg  10/20 for insomnia 6. Left buttock unstageable ulcer.  Continue Medihoney to buttock wound daily cover with foam dressing.  Changing dressing every 3 days or as needed soiling   7. Fluids/Electrolytes/Nutrition: Routine in and outs with follow-up chemistries  -Eating  well with cueing  8.  Cavitary right lower lobe pneumonia likely  aspiration pneumonia/MRSA pneumonia.  Resolved    Latest Ref Rng & Units 02/05/2023   11:12 AM 01/22/2023    6:36 AM 12/20/2022   10:54 AM  CBC  WBC 4.0 - 10.5 K/uL 8.5  8.1  5.6   Hemoglobin 12.0 - 15.0 g/dL 40.9  81.1  91.4   Hematocrit 36.0 - 46.0 % 46.7  47.5  42.8   Platelets 150 - 400 K/uL 284  274  253      9.  History of drug abuse.  Positive cocaine on urine drug screen.  Provide counseling  10.  AKI/hypovolemia and ATN.  Resolved Appears to have fair fluid intake, creatinine has been stable    Latest Ref Rng & Units 03/09/2023    6:13 AM 03/02/2023    7:24 AM 02/23/2023   10:37 AM  BMP  Creatinine 0.44 - 1.00 mg/dL 7.82  9.56  2.13     11.  Mild transaminitis with rhabdomyolysis.  Both resolved  12.  Hypotension. Resolved, d/c midodrine  -BP stable    13. Impaired initiation: continue amantadine 100mg  BID  14. Hyperlycemia: CBGs mildly elevated, d/ced checks  15. Spasticity:   -discussed serial casting with OT and her mom  - continue baclofen to 20mg  TID and tizanidine to 4mg  TID. Continue wrist, elbow brace, and PRAFOs. Ordered a night splint for LLE  Repeat CMP today given chronic tizanidine use  -9/7 continues to have significant tone despite use of tizanidine and baclofen, may be beneficial consider baclofen pump in outpatient setting if this causes significant difficulties in care  9/8 consider decrease baclofen if dyskinetic movements continue  10/5-6  continue orthotics, ROM, positioning  when possible 16. Bowel and bladder incontinence: continue bowel and bladder program  17. Left hand skin maceration: Eucerin ordered, discussed hand split with OT, conitnue  18. History of magnesium deficiency: magnesium 500mg  started HS  19. HTN: see above Increase tizanidine to 6mg  TID. Magnesium supplement added HS. Increase propanolol to 20mg  TID, continue this dose  -10/ 20 BP stable, continue current regimen    03/12/2023    3:20 AM 03/11/2023   10:31 PM  03/11/2023    7:41 PM  Vitals with BMI  Systolic 97 100 100  Diastolic 63 73 70  Pulse 75  73 81     20. Area of redness between left thumb and index finger: continue foam dressing  21. Scalp eczema: selsun blue ordered, continue  -9/7 patient's mother feels like this is causing her to itch, will start some hydrocortisone cream  22. Fatigue: improved, discussed with team may be secondary to anti-spasticity medications, B12 reviewed and is 335, in suboptimal range, will give 1,000U B12 injection 8/7, will order monthly as needed, metanx started, increase to BID, continue  23. Right wrist swelling: XR ordered and shows no acute fracture, shows 2.78mm ulnar variance, brace removed to given patient a break from it. Voltaren gel ordered prn for discomfort, continue, CT ordered and show no acute osseus injury, continue ace wrap  23. Dysphagia: continue D3/thins--tolerating this diet  24. MRSA nares positive: negative on repeat, precautions d/ced  25. Tachycardic: resolved, d/c propanolol.  continueTizanidine increased to 6mg  TID. Resolved, amantadine increased back to 100mg  BID   10/28 HR controlled in 80's 26.  Dyskinetic movements in head and neck - appears to be myoclonic jerks related to hypoxic brain injury - as discussed with pt mom, no specific meds for this on antispasticity meds , consioder benzo although with pt's SUD would avoid this class  EEG reviewed and was negative for seizure.  Decrease baclofen to 15mg  TID  27. Small sore on the base of 5th metatarsal head: local wound care, continue  28. Rash on face: resolved, discussed with mother could have been secondary to added sugars in nutritional supplements  29. Sacral pressure injury: air mattress ordered, Placed nursing order to offload q1 hour while awake, continue medihoney, wound care consulted, discussed with nursing that this is improving  30. Cervical extensor weakness: tried decreasing tizanidine but spasticity appears  increased and BP elevated so will increase back to 6mg  TID, continue this dose, continue cervical collar  31. Low protein stores: prosource ordered, continue  32. Intermittent lethargy: labs reviewed and are stable, discussed with team may be secondary to anti-spasticity medications  33. Blood blister to left heel: continue foam heel protectors.  34. Suboptimal vitamin D level: increase to 5,000 U daily. continue this dose  35. Hypotension: resolved, decrease propanolol to daily. decrease baclofen to 15 mg TID, continue this dose  26. Foot pain: tramadol ordered prn, improved today, continue prn  27. S/p fall: CT head reviewed and is stable        LOS: 114 days A FACE TO FACE EVALUATION WAS PERFORMED  Ranelle Oyster 03/12/2023, 9:53 AM

## 2023-03-12 NOTE — Progress Notes (Signed)
Endorses feeling tired this morning. Expresses only sleeping for about "50 minutes" last night. Restless sleep waking up intermittently throughout the night.

## 2023-03-12 NOTE — Progress Notes (Signed)
Physical Therapy Session Note  Patient Details  Name: Nicole [name] MRN: [id number] Date of Birth: [birth year]  Today's Date: 03/12/2023 PT Individual Time: 4098-1191 PT Individual Time Calculation (min): 27 min   Short Term Goals: Week 2:  PT Short Term Goal 1 (Week 2): Week 14; pt will complete bed mobility with totalA (pt <25%) PT Short Term Goal 2 (Week 2): Week 14: pt will maintain unsupported sitting balance for up to 1 minute consistently with CGA PT Short Term Goal 3 (Week 2): Week 14: Pt will complete bed<>chair transfers with totalA (pt <25%) PT Short Term Goal 4 (Week 2): Pt will be assessed for custom wheelchair needs  Skilled Therapeutic Interventions/Progress Updates:      Pt presents in bed in sidelying position. Reports her L knee hurts, but unable to rate. Repositioning and mobility provided for pain management.  Brief checked and dry. Donned pants with totalA at bed level. Pt attempted to bridge (pushing with her RLE) to lift hips off bed to assist in pulling pants over hips. Donned socks and shoes with dependent assist.   Supine<>sitting EOB with maxA with hospital bed features - improved initiation from patient and using her RUE to push her trunk up to sitting. TotalA squat pivot transfer into manual wheelchair and then wheeled sinkside for self care tasks. She brushed her teeth, purposefully, with setupA for applying tooth paste. She was even able to floss her teeth (with dental flossers) with cues although did have some motor perseveration with this task.  Pt ended treatment sitting in w/c with safety belt alarm on, call bell in lap.     Therapy Documentation Precautions:  Precautions Precautions: Fall Precaution Comments: Contact precautions & delayed processing Required Braces or Orthoses: Other Brace Other Brace: B adjustable night splints; B elbow "cosey" splints; L palm protector; R mitt Restrictions Weight Bearing Restrictions: No General:       Therapy/Group: Individual Therapy  Grae Leathers P Dejion Grillo 03/12/2023, 8:01 AM

## 2023-03-12 NOTE — Progress Notes (Signed)
Occupational Therapy Session Note  Patient Details  Name: Nicole [name] MRN: [id number] Date of Birth: 1975-11-29  Today's Date: 03/12/2023 OT Individual Time: 1145-1210 OT Individual Time Calculation (min): 25 min    Short Term Goals: Week 15:  OT Short Term Goal 1 (Week 15): Patient will attempt to use BLE to assist with scooting or early aspect of transfer OT Short Term Goal 1 - Progress (Week 15): Other (comment) OT Short Term Goal 2 (Week 15): Patient will use right hand to pull pants over hips to aide with dressing OT Short Term Goal 2 - Progress (Week 15): Other (comment) OT Short Term Goal 3 (Week 15): Patient will feed herself 100% of meal following set up with intermittent min assist OT Short Term Goal 3 - Progress (Week 15): Other (comment) OT Short Term Goal 4 (Week 15): Patient will complete oral care after set up with min cueing   Skilled Therapeutic Interventions/Progress Updates:    Patient received seated in wheelchair fully dressed.  Patient said she did not sleep as she had a dream that someone was trying to kill her.  She indicated she frequently has dreams like this.   Patient agreeable to go to gym - transported in wheelchair.  Working in parallel bars worked on sit to stand.  Patient able to follow directives to place right hand on bar - slide hand forward, lean forward.  Patient attempting to pull up on bar to help lift off seat.  Patient encouraged to lean further forward/ keeping feet on floor.  Patient able to lift off chair with min assist x 3.  Achieving erect standing position required max assist then mod assist as patient with limited rage in hips and knees.   Patient returned to room, and left up in wheelchair with safety belt in pace and engaged and call ell in reach.    Therapy Documentation Precautions:  Precautions Precautions: Fall Precaution Comments: Contact precautions & delayed processing Required Braces or Orthoses: Other Brace Other Brace: B  adjustable night splints; B elbow "cosey" splints; L palm protector; R mitt Restrictions Weight Bearing Restrictions: No   Pain:  Does not score - but has consistent pain in left foot and knee     Therapy/Group: Individual Therapy  [name] 03/12/2023, 12:47 PM

## 2023-03-13 NOTE — Progress Notes (Signed)
Occupational Therapy Session Note  Patient Details  Name: Nicole [name] MRN: [id number] Date of Birth: April 01, 1976  Today's Date: 03/13/2023 OT Individual Time: 9528-4132 OT Individual Time Calculation (min): 28 min    Short Term Goals: Week 15:  OT Short Term Goal 1 (Week 15): Patient will attempt to use BLE to assist with scooting or early aspect of transfer OT Short Term Goal 1 - Progress (Week 15): Other (comment) OT Short Term Goal 2 (Week 15): Patient will use right hand to pull pants over hips to aide with dressing OT Short Term Goal 2 - Progress (Week 15): Other (comment) OT Short Term Goal 3 (Week 15): Patient will feed herself 100% of meal following set up with intermittent min assist OT Short Term Goal 3 - Progress (Week 15): Other (comment) OT Short Term Goal 4 (Week 15): Patient will complete oral care after set up with min cueing  Skilled Therapeutic Interventions/Progress Updates:     Pt received semi-reclined in bed presenting to be in good spirits receptive to skilled OT session reporting 0/10 pain- OT offering intermittent rest breaks, repositioning, and therapeutic support to optimize participation in therapy session. Pt dressed and ready for the day with dry, clean brief donned. Pt came to EOB with mod A using bed rail with Pt noted to be challenged when attempt to motor plan and problem solve through log rolling technique. Pt completed squat pivot to R with max A. Positioned Pt at sink and worked on sit<>stands with emphasis on anterior weight shifting to decrease overall bourdon of care. Initially had Pt with R UE supported on sink and worked on Pt pulling self to sink to facilitate anterior weight shift with mod A required. Pt then completed sit<>stands x3 trials with mod A, however Pt with decreased hip extension and only powering up to partial standing position. Pt requesting to brush teeth. Pt able to complete oral care with supervision- min verbal cues required to  initiate spitting out water vs swallowing it. Pt then washed her face and brushed her hair with set-up assist. Pt requesting to return to bed at end of session. Squat pivot wc> EOB max A. Pt was left resting in bed with call bell in reach, bed alarm on, and all needs met.    Therapy Documentation Precautions:  Precautions Precautions: Fall Precaution Comments: Contact precautions & delayed processing Required Braces or Orthoses: Other Brace Other Brace: B adjustable night splints; B elbow "cosey" splints; L palm protector; R mitt Restrictions Weight Bearing Restrictions: No   Therapy/Group: Individual Therapy  STARNISHA CACCIOLA 03/13/2023, 7:49 AM

## 2023-03-13 NOTE — Progress Notes (Signed)
PROGRESS NOTE   Subjective/Complaints: Patient's chart reviewed- No issues reported overnight Vitals signs stable except for hypotension, d/c clonidine patch  ROS: Patient denies fever, rash, sore throat, blurred vision, dizziness, nausea, vomiting, diarrhea, cough, shortness of breath or chest pain, joint or back/neck pain, headache .   Objective:   No results found. No results for input(s): "WBC", "HGB", "HCT", "PLT" in the last 72 hours.     No results for input(s): "NA", "K", "CL", "CO2", "GLUCOSE", "BUN", "CREATININE", "CALCIUM" in the last 72 hours.      Intake/Output Summary (Last 24 hours) at 03/13/2023 0958 Last data filed at 03/13/2023 0800 Gross per 24 hour  Intake 714 ml  Output --  Net 714 ml       Pressure Injury 02/09/23 Heel Left Deep Tissue Pressure Injury - Purple or maroon localized area of discolored intact skin or blood-filled blister due to damage of underlying soft tissue from pressure and/or shear. Black/purple and boggy,  size of a quar (Active)  02/09/23 0543  Location: Heel  Location Orientation: Left  Staging: Deep Tissue Pressure Injury - Purple or maroon localized area of discolored intact skin or blood-filled blister due to damage of underlying soft tissue from pressure and/or shear.  Wound Description (Comments): Black/purple and boggy,  size of a quarter.  Foam dressing applied  Present on Admission: No    Physical Exam: Vital Signs Blood pressure (!) 89/69, pulse 81, temperature 98 F (36.7 C), resp. rate 16, height 5\' 1"  (1.549 m), weight 46.1 kg, SpO2 100%.  Constitutional: No distress . Vital signs reviewed. HEENT: NCAT, EOMI, oral membranes moist Neck: supple Cardiovascular: RRR without murmur. No JVD    Respiratory/Chest: CTA Bilaterally without wheezes or rales. Normal effort    GI/Abdomen: BS +, non-tender, non-distended Ext: no clubbing, cyanosis, or edema Psych:  flat  Skin: + Scaling dried skin on bilateral heels  - 2 small excoriations, sore on the base of the 5th metatarsal head -no longer present - rash on face - no longer present - stage 2 to right toe -no longer present  + area of redness between left thumb and index finger-now minimal, purple bruise left buttock unstageable - not examined +redness to bilateral elbows -not appreciated + Left heel stage III DTI, covered in foam dressing  Neurologic: AAOx2,  to year but not month or date. Follows most simple commands.  + Hypophonation, speech intelligability much improved  Prior exams: MAS 1 right elbow,MAS 1 in RIght finger and wrist flexors  MAS 3 knee flexors, and MAS 3 L>R  hip adductors  MAS 3 L elbow , MAS 3-4 in L wrist and finger flexors  + LUE WHO--- Hypersensitivity to left hand improved  Motor 0/5 strength in LUE and LLE, showing volitional movements in right lower extremity but difficulty to obtain MMT due to her pain/braces, able to do upper body dressing now! Exam stable 10/29  Twice daily for right upper extremity 1/5 finger flexion, otherwise no volitional movement of left upper extremity or bilateral lowers + BL UE WHO.    Musculoskeletal: reduced bilateral shoulder and elbow ROM due to increased tone , pain with passive knee extension bilaterally   .  Assessment/Plan: 1. Functional deficits which require 3+ hours per day of interdisciplinary therapy in a comprehensive inpatient rehab setting. Physiatrist is providing close team supervision and 24 hour management of active medical problems listed below. Physiatrist and rehab team continue to assess barriers to discharge/monitor patient progress toward functional and medical goals  Care Tool:  Bathing    Body parts bathed by patient: Face, Abdomen, Chest, Left upper leg, Right upper leg   Body parts bathed by helper: Right arm, Left arm, Front perineal area, Buttocks, Left lower leg, Right lower leg Body parts  n/a: Right arm, Left arm, Front perineal area, Buttocks, Right upper leg, Left upper leg, Right lower leg, Left lower leg   Bathing assist Assist Level: Maximal Assistance - Patient 24 - 49%     Upper Body Dressing/Undressing Upper body dressing   What is the patient wearing?: Pull over shirt    Upper body assist Assist Level: Moderate Assistance - Patient 50 - 74%    Lower Body Dressing/Undressing Lower body dressing      What is the patient wearing?: Pants, Incontinence brief     Lower body assist Assist for lower body dressing: Total Assistance - Patient < 25%     Toileting Toileting Toileting Activity did not occur Press photographer and hygiene only): N/A (no void or bm)  Toileting assist Assist for toileting: Dependent - Patient 0%     Transfers Chair/bed transfer  Transfers assist  Chair/bed transfer activity did not occur: Safety/medical concerns  Chair/bed transfer assist level: Dependent - Patient 0%     Locomotion Ambulation   Ambulation assist   Ambulation activity did not occur: Safety/medical concerns  Assist level: 2 helpers   Max distance: 37ft   Walk 10 feet activity   Assist  Walk 10 feet activity did not occur: Safety/medical concerns  Assist level: 2 helpers     Walk 50 feet activity   Assist Walk 50 feet with 2 turns activity did not occur: Safety/medical concerns         Walk 150 feet activity   Assist Walk 150 feet activity did not occur: Safety/medical concerns         Walk 10 feet on uneven surface  activity   Assist Walk 10 feet on uneven surfaces activity did not occur: Safety/medical concerns         Wheelchair     Assist Is the patient using a wheelchair?: Yes Type of Wheelchair: Manual    Wheelchair assist level: Dependent - Patient 0% Max wheelchair distance: 3ft    Wheelchair 50 feet with 2 turns activity    Assist    Wheelchair 50 feet with 2 turns activity did not occur:  Safety/medical concerns   Assist Level: Dependent - Patient 0%   Wheelchair 150 feet activity     Assist  Wheelchair 150 feet activity did not occur: Safety/medical concerns   Assist Level: Dependent - Patient 0%   Blood pressure (!) 89/69, pulse 81, temperature 98 F (36.7 C), resp. rate 16, height 5\' 1"  (1.549 m), weight 46.1 kg, SpO2 100%.  Medical Problem List and Plan: 1. 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 Code status  DNR -Continue CIR therapies including PT, OT, and SLP  Team conference 10/23   2.  Impaired mobility: continue Lovenox, weekly creatinine ordered             -antiplatelet therapy: N/A  3. Knee paint: Tylenol as needed, continue voltaren gel scheduled.   XR reviewed and is stable, discussed with therapy  4. History of anxiety: discussed with her mom that she feels this was a big risk factor for her accident, propanolol ordered.   5. Neuropsych/cognition: This patient is not capable of making decisions on her own behalf  - 10/12: Still not capable of medical decision making; BUT oriented and communicating, following directions - MUCH improved .Will add PRN melatonin 3mg  10/20 for insomnia 6. Left buttock unstageable ulcer.  Continue Medihoney to buttock wound daily cover with foam dressing.  Changing dressing every 3 days or as needed soiling   7. Fluids/Electrolytes/Nutrition: Routine in and outs with follow-up chemistries  -Eating  well with cueing  8.   Cavitary right lower lobe pneumonia likely aspiration pneumonia/MRSA pneumonia.  Resolved    Latest Ref Rng & Units 02/05/2023   11:12 AM 01/22/2023    6:36 AM 12/20/2022   10:54 AM  CBC  WBC 4.0 - 10.5 K/uL 8.5  8.1  5.6   Hemoglobin 12.0 - 15.0 g/dL 09.8  11.9  14.7   Hematocrit 36.0 - 46.0 % 46.7  47.5  42.8   Platelets 150 - 400 K/uL 284  274  253      9.  History of drug abuse.  Positive cocaine on urine drug screen.  Provide counseling  10.  AKI/hypovolemia and ATN.  Resolved Appears to have fair fluid intake, creatinine has been stable    Latest Ref Rng & Units 03/09/2023    6:13 AM 03/02/2023    7:24 AM 02/23/2023   10:37 AM  BMP  Creatinine 0.44 - 1.00 mg/dL 8.29  5.62  1.30     11.  Mild transaminitis with rhabdomyolysis.  Both resolved  12.  Hypotension. Resolved, d/c midodrine  -BP stable    13. Impaired initiation: continue amantadine 100mg  BID  14. Hyperlycemia: CBGs mildly elevated, d/ced checks  15. Spasticity:   -discussed serial casting with OT and her mom  - continue baclofen to 20mg  TID and tizanidine to 4mg  TID. Continue wrist, elbow brace, and PRAFOs. Ordered a night splint for LLE  Repeat CMP today given chronic tizanidine use  -9/7 continues to have significant tone despite use of tizanidine and baclofen, may be beneficial consider baclofen pump in outpatient setting if this causes significant difficulties in care  9/8 consider decrease baclofen if dyskinetic movements continue  10/5-6  continue orthotics, ROM, positioning  when possible 16. Bowel and bladder incontinence: continue bowel and bladder program  17. Left hand skin maceration: Eucerin ordered, discussed hand split with OT, continue  18. History of magnesium deficiency: magnesium 500mg  started HS  19. HTN: see above Increase tizanidine to 6mg  TID. Magnesium supplement added HS. Increase propanolol to 20mg  TID, continue this dose, d/c clonidine patch given hypotension    03/13/2023     5:09 AM 03/12/2023    8:04 PM 03/12/2023    1:22 PM  Vitals with BMI  Systolic 89 95 101  Diastolic 69 66 73  Pulse 81 72 84  20. Area of redness between left thumb and index finger: continue foam dressing  21. Scalp eczema: selsun blue ordered, continue  -9/7 patient's mother feels like this is causing her to itch, will start some hydrocortisone cream  22. Fatigue: improved, discussed with team may be secondary to anti-spasticity medications, B12 reviewed and is 335, in suboptimal range, will give 1,000U B12 injection 8/7, will order monthly as needed, metanx started, increase to BID, continue  23. Right wrist swelling: XR ordered and shows no acute fracture, shows 2.80mm ulnar variance, brace removed to given patient a break from it. Voltaren gel ordered prn for discomfort, continue, CT ordered and show no acute osseus injury, continue ace wrap  23. Dysphagia: continue D3/thins--tolerating this diet  24. MRSA nares positive: negative on repeat, precautions d/ced  25. Tachycardic: resolved, d/c propanolol.  continueTizanidine increased to 6mg  TID. Resolved, amantadine increased back to 100mg  BID   10/28 HR controlled in 80's 26.  Dyskinetic movements in head and neck - appears to be myoclonic jerks related to hypoxic brain injury - as discussed with pt mom, no specific meds for this on antispasticity meds , consioder benzo although with pt's SUD would avoid this class  EEG reviewed and was negative for seizure.  Decrease baclofen to 15mg  TID  27. Small sore on the base of 5th metatarsal head: local wound care, continue  28. Rash on face: resolved, discussed with mother could have been secondary to added sugars in nutritional supplements  29. Sacral pressure injury: air mattress ordered, Placed nursing order to offload q1 hour while awake, continue medihoney, wound care consulted, discussed with nursing that this is improving  30. Cervical extensor weakness: tried decreasing  tizanidine but spasticity appears increased and BP elevated so will increase back to 6mg  TID, continue this dose, continue cervical collar  31. Low protein stores: prosource ordered, continue  32. Intermittent lethargy: labs reviewed and are stable, discussed with team may be secondary to anti-spasticity medications  33. Blood blister to left heel: continue foam heel protectors.  34. Suboptimal vitamin D level: increase to 5,000 U daily. continue this dose  35. Hypotension: resolved, decrease propanolol to daily. decrease baclofen to 15 mg TID, continue this dose  26. Foot pain: tramadol ordered prn, improved today, continue prn  27. S/p fall: CT head reviewed and is stable        LOS: 115 days A FACE TO FACE EVALUATION WAS PERFORMED  Drema Pry Lashone Stauber 03/13/2023, 9:58 AM

## 2023-03-13 NOTE — Progress Notes (Signed)
Speech Language Pathology Daily Session Note  Patient Details  Name: Nicole [name] MRN: [id number] Date of Birth: [birth year]  Today's Date: 03/13/2023 SLP Individual Time: 1131-1159 SLP Individual Time Calculation (min): 28 min  Short Term Goals: Week 9: SLP Short Term Goal 1 (Week 9): Week 16 - Pt will perform verbal problem solving tasks with overall Min A verbal and visual cues to verbalize solutions. SLP Short Term Goal 2 (Week 9): Week 16-Patient will utilize external aids for orientation to place and time with supervision level verbal cues SLP Short Term Goal 3 (Week 9): Week 16 - Pt will demonstrate sustained attention to basic tasks for 8 minutes with Mod A multimodal cues for redirection. SLP Short Term Goal 4 (Week 9): Week 16 - Pt will utilize increased vocal intensity w/ MinA verbal cues to remain 90% intelligibile at phrase level  Skilled Therapeutic Interventions: SLP conducted skilled therapy session targeting cognitive retraining goals. SLP guided patient through external aid/directional orientation task with patient benefiting from min-mod assist to identify appropriate task answers. During verbal problem solving tasks, patient required overall min-mod assist to find location of answers presented on external aids. Patient reporting hip pain that comes in waves throughout session, throbbing/stabbing with intermittent interludes of loss in sustained attention d/t pain. Otherwise, patient sustained attention to task throughout entire session with min-mod cues. Patient was left in lowered bed with call bell in reach and bed alarm set. SLP will continue to target goals per plan of care.      Pain Pain Assessment Pain Scale: Faces Faces Pain Scale: Hurts whole lot Pain Location: Hip  Therapy/Group: Individual Therapy  [name], M.A., CCC-SLP  [name] 03/13/2023, 12:17 PM

## 2023-03-13 NOTE — Progress Notes (Signed)
Physical Therapy Session Note  Patient Details  Name: Nicole [name] MRN: [id number] Date of Birth: [birth year]  Today's Date: 03/13/2023 PT Individual Time: 1430-1500 PT Individual Time Calculation (min): 30 min   Short Term Goals: Week 2:  PT Short Term Goal 1 (Week 2): Week 14; pt will complete bed mobility with totalA (pt <25%) PT Short Term Goal 2 (Week 2): Week 14: pt will maintain unsupported sitting balance for up to 1 minute consistently with CGA PT Short Term Goal 3 (Week 2): Week 14: Pt will complete bed<>chair transfers with totalA (pt <25%) PT Short Term Goal 4 (Week 2): Pt will be assessed for custom wheelchair needs  Skilled Therapeutic Interventions/Progress Updates:      Pt in bed sleeping on arrival - awakens to voice. Pt with dry brief. Bed mobility completed with modA for LLE management and trunk support - continues to show improved initiation. Completed squat pivot transfer with maxA towards her R side into w/c and then transported to mat table in gym. Worked on sitting balance, purposeful movements, and sustained attention in gym environment. Sitting balance fluctuated b/w min to maxA - quick to lose his balance - aware of loss of balance but unable to correct. Able to "toss" basketball into eye level goal 1x5 with cues and assist for balance - improved motor planning and processing. Returned to her room and left sitting upright in wheelchair with seat belt alarm on, tele sitter in room, call bell in lap.   Therapy Documentation Precautions:  Precautions Precautions: Fall Precaution Comments: Contact precautions & delayed processing Required Braces or Orthoses: Other Brace Other Brace: B adjustable night splints; B elbow "cosey" splints; L palm protector; R mitt Restrictions Weight Bearing Restrictions: No General:      Therapy/Group: Individual Therapy  Orrin Brigham 03/13/2023, 7:52 AM

## 2023-03-14 MED ORDER — TIZANIDINE HCL 4 MG PO TABS
4.0000 mg | ORAL_TABLET | Freq: Three times a day (TID) | ORAL | Status: DC
  Administered 2023-03-14 – 2023-03-29 (×45): 4 mg via ORAL
  Filled 2023-03-14 (×47): qty 1

## 2023-03-14 NOTE — Progress Notes (Signed)
Occupational Therapy Weekly Progress Note  Patient Details  Name: SYDNI JENSEN MRN: [id number] Date of Birth: [birth year]  Beginning of progress report period: March 05, 2023 End of progress report period: March 14, 2023  Today's Date: 03/14/2023 OT Individual Time: 0933-1000 OT Individual Time Calculation (min): 27 min    Patient has met 4 of 4 short term goals.  Improving participation in familiar functional tasks  Patient continues to demonstrate the following deficits: muscle joint tightness and muscle paralysis, impaired timing and sequencing, abnormal tone, unbalanced muscle activation, motor apraxia, decreased coordination, and decreased motor planning, decreased visual acuity and decreased visual motor skills, decreased attention to left, decreased awareness, decreased problem solving, decreased safety awareness, decreased memory, and delayed processing, and decreased sitting balance, decreased standing balance, decreased postural control, hemiplegia, and decreased balance strategies and therefore will continue to benefit from skilled OT intervention to enhance overall performance with BADL and Reduce care partner burden.  Patient progressing toward long term goals..  Continue plan of care.  OT Short Term Goals Week 15:  OT Short Term Goal 1 (Week 15): Patient will attempt to use BLE to assist with scooting or early aspect of transfer OT Short Term Goal 1 - Progress (Week 15): Met OT Short Term Goal 2 (Week 5): Patient will use right hand to pull pants over hips to aide with dressing OT Short Term Goal 2 - Progress (Week 15): Met OT Short Term Goal 3 (Week 15): Patient will feed herself 100% of meal following set up with intermittent min assist OT Short Term Goal 3 - Progress (Week 15): Met OT Short Term Goal 4 (Week 5): Patient will complete oral care after set up with min cueing OT Short Term Goal 4 - Progress (Week 5): Met  Week 16:  OT Short Term Goal 1 (Week 16): Patient  will indicate need to void an dassist with trasnfer with max assist OT Short Term Goal 2 (Week 6): Patient will utilize left hand x 3 to aide with bimaual tasks with min cueing OT Short Term Goal 3 (Week 6): Patient will pull self to partial standing with mod assist   Skilled Therapeutic Interventions/Progress Updates:    Patient received fully dressed up in wheelchair.  Transported to gym to address sit to lift off in chair.  Patient able to recall prior attempts to stand.  Use of rail helpful - significant improvement in ability to weight shift forward in chair.  Patient continuing to improve with use of LE's to help with transitions - but still needs max assist - not yet functional to aide with LB dressing.  Left up in wheelchair with safety belt in place and engaged and call bell in reach.    Therapy Documentation Precautions:  Precautions Precautions: Fall Precaution Comments: Contact precautions & delayed processing Required Braces or Orthoses: Other Brace Other Brace: B adjustable night splints; B elbow "cosey" splints; L palm protector; R mitt Restrictions Weight Bearing Restrictions: No  Pain: Pain Assessment Pain Scale: Faces Faces Pain Scale: Hurts a little bit     Therapy/Group: Individual Therapy  [name] 03/14/2023, 12:32 PM

## 2023-03-14 NOTE — Progress Notes (Signed)
Physical Therapy Session Note  Patient Details  Name: CYANA NAGARAJAN MRN: [id number] Date of Birth: August 02, 1975  Today's Date: 03/14/2023 PT Individual Time: 0806-0829 PT Individual Time Calculation (min): 23 min   Short Term Goals: Week 2:  PT Short Term Goal 1 (Week 2): Week 14; pt will complete bed mobility with totalA (pt <25%) PT Short Term Goal 2 (Week 2): Week 14: pt will maintain unsupported sitting balance for up to 1 minute consistently with CGA PT Short Term Goal 3 (Week 2): Week 14: Pt will complete bed<>chair transfers with totalA (pt <25%) PT Short Term Goal 4 (Week 2): Pt will be assessed for custom wheelchair needs  Skilled Therapeutic Interventions/Progress Updates:      Pt lying in bed, awkwardly positioned. Noted to be incontinent of bladder with wet brief that's soiled through to her hospital gown. TotalA for brief change at bed level - min/modA for rolling in bed which is an improvement.   Supine<>sitting EOB with modA. Once at EOB, patient having bowel incontinence. So returned to supine for another brief change with similar manner as above.   Assisted back to the wheelchair with max/totalA squat pivot transfer. Finished with dressing - modA for UB dressing and totalA for LB dressing. Able to partially stand at the sink with maxA to pull pants over hips - patient pushing herself into extension while trying to partially stand.  Concluded treatment in wheelchair with seat belt alarm on, call bell in reach.  Therapy Documentation Precautions:  Precautions Precautions: Fall Precaution Comments: Contact precautions & delayed processing Required Braces or Orthoses: Other Brace Other Brace: B adjustable night splints; B elbow "cosey" splints; L palm protector; R mitt Restrictions Weight Bearing Restrictions: No General:      Therapy/Group: Individual Therapy  Orrin Brigham 03/14/2023, 7:54 AM

## 2023-03-14 NOTE — Progress Notes (Signed)
Speech Language Pathology Daily Session Note  Patient Details  Name: Nicole [name] MRN: [id number] Date of Birth: 13-Nov-1975  Today's Date: 03/14/2023 SLP Individual Time: 1405-1500 SLP Individual Time Calculation (min): 55 min  Short Term Goals: Week 9: SLP Short Term Goal 1 (Week 9): Week 16 - Pt will perform verbal problem solving tasks with overall Min A verbal and visual cues to verbalize solutions. SLP Short Term Goal 2 (Week 9): Week 16-Patient will utilize external aids for orientation to place and time with supervision level verbal cues SLP Short Term Goal 3 (Week 9): Week 16 - Pt will demonstrate sustained attention to basic tasks for 8 minutes with Mod A multimodal cues for redirection. SLP Short Term Goal 4 (Week 9): Week 16 - Pt will utilize increased vocal intensity w/ MinA verbal cues to remain 90% intelligibile at phrase level  Skilled Therapeutic Interventions: SLP conducted skilled therapy session targeting cognitive retraining goals and communication goals. Patient continues to be disoriented to date, stating date is July 7th. Patient slowly approaching correct date, though requires mod-maxA to remain oriented throughout session despite placement of external aids. During verbal problem solving tasks, patient required mod to max assist to provide answers to basic to mildly complex questions. Patient provided antonyms/synonyms to provided words with mod assist. During speech intelligibility tasks, patient benefited from mod assist to increase vocal intensity at the phrase level and max assist to increase vocal intensity at the sentence level. Patient sustained attention to task for 10 minutes given mod multimodal cues for redirection. Patient was left in lowered bed with call bell in reach and bed alarm set. SLP will continue to target goals per plan of care.       Pain Pain Assessment Pain Scale: 0-10 Pain Score: 6  Pain Location: Head Pain Intervention(s): Medication (See  eMAR)  Therapy/Group: Individual Therapy  [name], M.A., CCC-SLP  [name] 03/14/2023, 2:52 PM

## 2023-03-14 NOTE — Patient Care Conference (Signed)
Inpatient RehabilitationTeam Conference and Plan of Care Update Date: 03/14/2023   Time: 11:20 AM    Patient Name: Nicole [name]      Medical Record Number: [id number]  Date of Birth: 12-Apr-1976 Sex: Female         Room/Bed: 4W09C/4W09C-01 Payor Info: Payor: [employer] / Plan: [employer] / Product Type: *No Product type* /    Admit Date/Time:  11/18/2022  3:15 PM  Primary Diagnosis:  Hypoxic brain injury [facility])  Hospital Problems: Principal Problem:   Hypoxic brain injury (HCC) Active Problems:   Protein-calorie malnutrition, severe   Essential hypertension   Pressure injury of skin    Expected Discharge Date: Expected Discharge Date:  (SNF pending)  Team Members Present: Physician leading conference: Dr. Sula Soda Social Worker Present: Dossie Der, LCSW Nurse Present: Chana Bode, RN PT Present: Wynelle Link, PT OT Present: Bretta Bang, OT SLP Present: [name], SLP PPS Coordinator present : Fae Pippin, SLP     Current Status/Progress Goal Weekly Team Focus  Bowel/Bladder   pt incontinent of b/b   Regain continence   Assist with time toileting q2-4 qshift and prn    Swallow/Nutrition/ Hydration               ADL's   mod assist upper body, max assist lower body, min assist feeding, oral care  max assist   Discharge planning, postural control, functional trasnfers    Mobility   maxA functional mobility, more volitional and purposeful efforts with functional mobility; improved intelligibility.  More irritability.   TotA overall  bed mobility, postural control, sitting balance, transfers    Communication   decreased vocal intensity, requires max verbal cues to increase   mod A   steady progress toward improving speech intelligibility    Safety/Cognition/ Behavioral Observations  improving sustained attention, orientation, and basic problem solving   modA   sustained attention, orientation, and basic problem  solving    Pain   pt c/o bilateral leg pain, pain score 7, prn meds given   <3 on pain scale   Assess qshift and prn    Skin   Redness to sacrum, skin intact, barrier cream applied   Maintain skin integrity  Assess qshift and prn      Discharge Planning:  Continue to branch out looking for long term SNF bed. Barriers are her age, drug history and amount of care she requires   Team Discussion: Patient remains confused with visual deficits and increased fidgeting.MD adjusting medications for hypotension.   Patient on target to meet rehab goals: Patient able to feed self and dress self however not functional with pulling up clothes after toileting and remains incontinent of bowel and bladder.  *See Care Plan and progress notes for long and short-term goals.   Revisions to Treatment Plan:  Changed to regular hospital bed Telesitter monitoring   Teaching Needs: Safety, total care needs, transfers, toileting, etc.   Current Barriers to Discharge: Home enviroment access/layout, Incontinence, and Lack of/limited family support  Possible Resolutions to Barriers: SNF pending     Medical Summary Current Status: hypotension, lightheaded, sacral pressure injury, low protein stores  Barriers to Discharge: Medical stability  Barriers to Discharge Comments: hypotension, lightheaded, sacral pressure injury, low protein stores Possible Resolutions to [employer] Focus: d/c clonidine patch, decresae tizanidine to 4mg , continue to monitor daily, continue prosource   Continued Need for Acute Rehabilitation Level of Care: The patient requires daily medical management by a physician with  specialized training in physical medicine and rehabilitation for the following reasons: Direction of a multidisciplinary physical rehabilitation program to maximize functional independence : Yes Medical management of patient stability for increased activity during participation in an intensive  rehabilitation regime.: Yes Analysis of laboratory values and/or radiology reports with any subsequent need for medication adjustment and/or medical intervention. : Yes   I attest that I was present, lead the team conference, and concur with the assessment and plan of the team.   Chana Bode B 03/14/2023, 3:54 PM

## 2023-03-14 NOTE — Progress Notes (Signed)
PROGRESS NOTE   Subjective/Complaints: No new complaints this morning Tolerated therapy well today Incontinent of bladder No issues overnight  ROS: Patient denies fever, rash, sore throat, blurred vision, dizziness, nausea, vomiting, diarrhea, cough, shortness of breath or chest pain, joint or back/neck pain, headache. +bladder incontinence  Objective:   No results found. No results for input(s): "WBC", "HGB", "HCT", "PLT" in the last 72 hours.     No results for input(s): "NA", "K", "CL", "CO2", "GLUCOSE", "BUN", "CREATININE", "CALCIUM" in the last 72 hours.   Intake/Output Summary (Last 24 hours) at 03/14/2023 1028 Last data filed at 03/14/2023 0938 Gross per 24 hour  Intake 477 ml  Output --  Net 477 ml       Pressure Injury 02/09/23 Heel Left Deep Tissue Pressure Injury - Purple or maroon localized area of discolored intact skin or blood-filled blister due to damage of underlying soft tissue from pressure and/or shear. Black/purple and boggy,  size of a quar (Active)  02/09/23 0543  Location: Heel  Location Orientation: Left  Staging: Deep Tissue Pressure Injury - Purple or maroon localized area of discolored intact skin or blood-filled blister due to damage of underlying soft tissue from pressure and/or shear.  Wound Description (Comments): Black/purple and boggy,  size of a quarter.  Foam dressing applied  Present on Admission: No    Physical Exam: Vital Signs Blood pressure 106/82, pulse 70, temperature 98 F (36.7 C), temperature source Oral, resp. rate 16, height 5\' 1"  (1.549 m), weight 46.1 kg, SpO2 100%.  Constitutional: No distress . Vital signs reviewed. HEENT: NCAT, EOMI, oral membranes moist Neck: supple Cardiovascular: RRR without murmur. No JVD    Respiratory/Chest: CTA Bilaterally without wheezes or rales. Normal effort    GI/Abdomen: BS +, non-tender, non-distended Ext: no clubbing,  cyanosis, or edema Psych: flat  Skin: + Scaling dried skin on bilateral heels  - 2 small excoriations, sore on the base of the 5th metatarsal head -no longer present - rash on face - no longer present - stage 2 to right toe -no longer present  + area of redness between left thumb and index finger-now minimal, purple bruise left buttock unstageable - not examined +redness to bilateral elbows -not appreciated + Left heel stage III DTI, covered in foam dressing  Neurologic: AAOx2,  to year but not month or date. Follows most simple commands.  + Hypophonation, speech intelligability much improved  Prior exams: MAS 1 right elbow,MAS 1 in RIght finger and wrist flexors  MAS 3 knee flexors, and MAS 3 L>R  hip adductors  MAS 3 L elbow , MAS 3-4 in L wrist and finger flexors  + LUE WHO--- Hypersensitivity to left hand improved  Motor 0/5 strength in LUE and LLE, showing volitional movements in right lower extremity but difficulty to obtain MMT due to her pain/braces, able to do upper body dressing now! Exam stable 10/30  Twice daily for right upper extremity 1/5 finger flexion, otherwise no volitional movement of left upper extremity or bilateral lowers + BL UE WHO.    Musculoskeletal: reduced bilateral shoulder and elbow ROM due to increased tone , pain with passive knee extension bilaterally   .  Assessment/Plan: 1. Functional deficits which require 3+ hours per day of interdisciplinary therapy in a comprehensive inpatient rehab setting. Physiatrist is providing close team supervision and 24 hour management of active medical problems listed below. Physiatrist and rehab team continue to assess barriers to discharge/monitor patient progress toward functional and medical goals  Care Tool:  Bathing    Body parts bathed by patient: Face, Abdomen, Chest, Left upper leg, Right upper leg   Body parts bathed by helper: Right arm, Left arm, Front perineal area, Buttocks, Left lower leg,  Right lower leg Body parts n/a: Right arm, Left arm, Front perineal area, Buttocks, Right upper leg, Left upper leg, Right lower leg, Left lower leg   Bathing assist Assist Level: Maximal Assistance - Patient 24 - 49%     Upper Body Dressing/Undressing Upper body dressing   What is the patient wearing?: Pull over shirt    Upper body assist Assist Level: Moderate Assistance - Patient 50 - 74%    Lower Body Dressing/Undressing Lower body dressing      What is the patient wearing?: Pants, Incontinence brief     Lower body assist Assist for lower body dressing: Total Assistance - Patient < 25%     Toileting Toileting Toileting Activity did not occur Press photographer and hygiene only): N/A (no void or bm)  Toileting assist Assist for toileting: Dependent - Patient 0%     Transfers Chair/bed transfer  Transfers assist  Chair/bed transfer activity did not occur: Safety/medical concerns  Chair/bed transfer assist level: Dependent - Patient 0%     Locomotion Ambulation   Ambulation assist   Ambulation activity did not occur: Safety/medical concerns  Assist level: 2 helpers   Max distance: 49ft   Walk 10 feet activity   Assist  Walk 10 feet activity did not occur: Safety/medical concerns  Assist level: 2 helpers     Walk 50 feet activity   Assist Walk 50 feet with 2 turns activity did not occur: Safety/medical concerns         Walk 150 feet activity   Assist Walk 150 feet activity did not occur: Safety/medical concerns         Walk 10 feet on uneven surface  activity   Assist Walk 10 feet on uneven surfaces activity did not occur: Safety/medical concerns         Wheelchair     Assist Is the patient using a wheelchair?: Yes Type of Wheelchair: Manual    Wheelchair assist level: Dependent - Patient 0% Max wheelchair distance: 57ft    Wheelchair 50 feet with 2 turns activity    Assist    Wheelchair 50 feet with 2 turns  activity did not occur: Safety/medical concerns   Assist Level: Dependent - Patient 0%   Wheelchair 150 feet activity     Assist  Wheelchair 150 feet activity did not occur: Safety/medical concerns   Assist Level: Dependent - Patient 0%   Blood pressure 106/82, pulse 70, temperature 98 F (36.7 C), temperature source Oral, resp. rate 16, height 5\' 1"  (1.549 m), weight 46.1 kg, SpO2 100%.  Medical Problem List and Plan: 1. 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 Code status  DNR -Continue CIR therapies including PT, OT, and SLP  Team conference 10/23   2.  Impaired mobility: continue Lovenox, weekly creatinine ordered             -antiplatelet therapy: N/A  3. Knee paint: Tylenol as needed, continue voltaren gel scheduled.   XR reviewed and is stable, discussed with therapy  4. History of anxiety: discussed with her mom that she feels this was a big risk factor for her accident, propanolol ordered.   5. Neuropsych/cognition: This patient is not capable of making decisions on her own behalf  - 10/12: Still not capable of medical decision making; BUT oriented and communicating, following directions - MUCH improved .Will add PRN melatonin 3mg  10/20 for insomnia 6. Left buttock unstageable ulcer.  Continue Medihoney to buttock wound daily cover with foam dressing.  Changing dressing every 3 days or as needed soiling   7. Fluids/Electrolytes/Nutrition: Routine in and outs with follow-up  chemistries  -Eating  well with cueing  8.  Cavitary right lower lobe pneumonia likely aspiration pneumonia/MRSA pneumonia.  Resolved    Latest Ref Rng & Units 02/05/2023   11:12 AM 01/22/2023    6:36 AM 12/20/2022   10:54 AM  CBC  WBC 4.0 - 10.5 K/uL 8.5  8.1  5.6   Hemoglobin 12.0 - 15.0 g/dL 16.1  09.6  04.5   Hematocrit 36.0 - 46.0 % 46.7  47.5  42.8   Platelets 150 - 400 K/uL 284  274  253      9.  History of drug abuse.  Positive cocaine on urine drug screen.  Provide counseling  10.  AKI/hypovolemia and ATN.  Resolved Appears to have fair fluid intake, creatinine has been stable    Latest Ref Rng & Units 03/09/2023    6:13 AM 03/02/2023    7:24 AM 02/23/2023   10:37 AM  BMP  Creatinine 0.44 - 1.00 mg/dL 4.09  8.11  9.14     11.  Mild transaminitis with rhabdomyolysis.  Both resolved  12.  Hypotension. Resolved, d/c midodrine  -BP stable    13. Impaired initiation: continue amantadine 100mg  BID  14. Hyperlycemia: CBGs mildly elevated, d/ced checks  15. Spasticity:   -discussed serial casting with OT and her mom  - continue baclofen to 20mg  TID and tizanidine to 4mg  TID. Continue wrist, elbow brace, and PRAFOs. Ordered a night splint for LLE  Repeat CMP today given chronic tizanidine use  -9/7 continues to have significant tone despite use of tizanidine and baclofen, may be beneficial consider baclofen pump in outpatient setting if this causes significant difficulties in care  9/8 consider decrease baclofen if dyskinetic movements continue  10/5-6  continue orthotics, ROM, positioning  when possible 16. Bowel and bladder incontinence: continue bowel and bladder program  17. Left hand skin maceration: Eucerin ordered, discussed hand split with OT, continue  18. History of magnesium deficiency: magnesium 500mg  started HS  19. HTN: see above Increase tizanidine to 6mg  TID. Magnesium supplement added HS. Increase propanolol to 20mg  TID, continue this dose, d/c  clonidine patch given hypotension    03/14/2023    4:52 AM 03/13/2023    8:03 PM 03/13/2023   12:44 PM  Vitals with BMI  Systolic 106 97 105  Diastolic 82 65 75  Pulse 70 77 73  20. Area of redness between left thumb and index finger: continue foam dressing  21. Scalp eczema: selsun blue ordered, continue  -9/7 patient's mother feels like this is causing her to itch, will start some hydrocortisone cream  22. Fatigue: improved, discussed with team may be secondary to anti-spasticity medications, B12 reviewed and is 335, in suboptimal range, will give 1,000U B12 injection 8/7, will order monthly as needed, metanx started, increase to BID, continue  23. Right wrist swelling: XR ordered and shows no acute fracture, shows 2.31mm ulnar variance, brace removed to given patient a break from it. Voltaren gel ordered prn for discomfort, continue, CT ordered and show no acute osseus injury, continue ace wrap  23. Dysphagia: continue D3/thins--tolerating this diet  24. MRSA nares positive: negative on repeat, precautions d/ced  25. Tachycardic: resolved, d/c propanolol.  continueTizanidine increased to 6mg  TID. Resolved, amantadine increased back to 100mg  BID   10/28 HR controlled in 80's 26.  Dyskinetic movements in head and neck - appears to be myoclonic jerks related to hypoxic brain injury - as discussed with pt mom, no specific meds for this on antispasticity meds , consioder benzo although with pt's SUD would avoid this class  EEG reviewed and was negative for seizure.  Decrease baclofen to 15mg  TID  27. Small sore on the base of 5th metatarsal head: local wound care, continue  28. Rash on face: resolved, discussed with mother could have been secondary to added sugars in nutritional supplements  29. Sacral pressure injury: air mattress ordered, Placed nursing order to offload q1 hour while awake, continue medihoney, wound care consulted, discussed with nursing that this is  improving  30. Cervical extensor weakness: tried decreasing tizanidine but spasticity appears increased and BP elevated so will increase back to 6mg  TID, continue this dose, continue cervical collar  31. Low protein stores: prosource ordered, continue  32. Intermittent lethargy: labs reviewed and are stable, discussed with team may be secondary to anti-spasticity medications  33. Blood blister to left heel: continue foam heel protectors.  34. Suboptimal vitamin D level: increase to 5,000 U daily. continue this dose  35. Hypotension: resolved, decrease propanolol to daily. decrease baclofen to 15 mg TID, continue this dose  26. Foot pain: tramadol ordered prn, improved today, continue prn  27. S/p fall: CT head reviewed and is stable        LOS: 116 days A FACE TO FACE EVALUATION WAS PERFORMED  Jackqulyn Mendel P Demontay Grantham 03/14/2023, 10:28 AM

## 2023-03-15 MED ORDER — ENOXAPARIN SODIUM 30 MG/0.3ML IJ SOSY
30.0000 mg | PREFILLED_SYRINGE | INTRAMUSCULAR | Status: DC
  Administered 2023-03-15 – 2023-05-08 (×54): 30 mg via SUBCUTANEOUS
  Filled 2023-03-15 (×52): qty 0.3

## 2023-03-15 NOTE — Progress Notes (Signed)
Occupational Therapy Session Note  Patient Details  Name: Nicole [name] MRN: [id number] Date of Birth: 07-Jun-1975  Today's Date: 03/15/2023 OT Individual Time: 5366-4403 OT Individual Time Calculation (min): 30 min    Short Term Goals: Week 16:  OT Short Term Goal 1 (Week 16): Patient will indicate need to void an dassist with trasnfer with max assist OT Short Term Goal 1 - Progress (Week 16): Other (comment) OT Short Term Goal 2 (Week 16): Patient will utilize left hand x 3 to aide with bimaual tasks with min cueing OT Short Term Goal 2 - Progress (Week 16): Other (comment) OT Short Term Goal 3 (Week 6): Patient will pull self to partial standing with mod assist OT Short Term Goal 3 - Progress (Week 16): Other (comment)  Skilled Therapeutic Interventions/Progress Updates:    Patient received supine in bed and greeted therapist with "Happy Halloween!!"  Patient agreeable to get out of bed to get dressed.  Patient has poor static sitting balance in unsupported conditions, but is improving with support of chair.  Transferred to wheelchair with max assist - patient even attempting to scoot towards right with min assist, but not very effectively.  Patient able to put pants over R leg while seated in chair, needs assist for left leg.  Patient spontaneously leaning forward in chair as task requires.  Patient needing total assist to pull pants over hips in standing.  Completed oral care while seated at sink.  Left up in chair with safety belt in place and engaged and call bell in reach.    Therapy Documentation Precautions:  Precautions Precautions: Fall Precaution Comments: Contact precautions & delayed processing Required Braces or Orthoses: Other Brace Other Brace: B adjustable night splints; B elbow "cosey" splints; L palm protector; R mitt Restrictions Weight Bearing Restrictions: No  Pain: Pain Assessment Pain Scale: Faces Faces Pain Scale: No hurt at rest, pain in feet and knees L>R  with activity     Therapy/Group: Individual Therapy  [name] 03/15/2023, 10:40 AM

## 2023-03-15 NOTE — Progress Notes (Signed)
Speech Language Pathology Daily Session Note  Patient Details  Name: Nicole [name] MRN: [id number] Date of Birth: [birth year]  Today's Date: 03/15/2023 SLP Individual Time: 1301-1400 SLP Individual Time Calculation (min): 59 min  Short Term Goals: Week 9: SLP Short Term Goal 1 (Week 9): Week 16 - Pt will perform verbal problem solving tasks with overall Min A verbal and visual cues to verbalize solutions. SLP Short Term Goal 2 (Week 9): Week 16-Patient will utilize external aids for orientation to place and time with supervision level verbal cues SLP Short Term Goal 3 (Week 9): Week 16 - Pt will demonstrate sustained attention to basic tasks for 8 minutes with Mod A multimodal cues for redirection. SLP Short Term Goal 4 (Week 9): Week 16 - Pt will utilize increased vocal intensity w/ MinA verbal cues to remain 90% intelligibile at phrase level  Skilled Therapeutic Interventions: SLP conducted skilled therapy session targeting cognitive retraining goals and use of speech intelligibility increasing strategies. SLP guided patient through various cognitive tasks while encouraging increase in vocal intensity. Patient benefited from max verbal cues to utilize increased vocal intensity at the word and phrase levels consistently. During cognitive tasks, patient sustained attention for 8 minutes given mod assist. Patient oriented to date today (!!) though suspect additional visual cues (halloween costumes worn by staff) assisted in orientation throughout the day. Will continue to monitor. In the middle of session, patient became somnolent, however with encouragement, change of environment, and provision of Halloween candy, patient remained awake for remainder of session. SLP guided patient through breathing task to facilitate connection between need for deep breaths and loud speech. Patient was left in chair with call bell in reach and chair alarm set. SLP will continue to target goals per plan of care.        Pain Pain Assessment Pain Scale: 0-10 Pain Score: 0-No pain  Therapy/Group: Individual Therapy  [name], M.A., CCC-SLP  [name] 03/15/2023, 1:41 PM

## 2023-03-15 NOTE — Progress Notes (Signed)
Ok to reduce DVT prophylaxis Lovenox to 30mg  SQ qday due to low wt per Dr. Carlis Abbott.   Ulyses Southward, PharmD, BCIDP, AAHIVP, CPP Infectious Disease Pharmacist 03/15/2023 2:53 PM

## 2023-03-15 NOTE — Progress Notes (Signed)
PROGRESS NOTE   Subjective/Complaints: No new complaints this morning Discussed headaches and she states they are well controlled with tramadol and tylenol No issues overnight  ROS: Patient denies fever, rash, sore throat, blurred vision, dizziness, nausea, vomiting, diarrhea, cough, shortness of breath or chest pain, joint or back/neck pain, headache. +bladder incontinence, +migraines  Objective:   No results found. No results for input(s): "WBC", "HGB", "HCT", "PLT" in the last 72 hours.     No results for input(s): "NA", "K", "CL", "CO2", "GLUCOSE", "BUN", "CREATININE", "CALCIUM" in the last 72 hours.   Intake/Output Summary (Last 24 hours) at 03/15/2023 1010 Last data filed at 03/15/2023 0805 Gross per 24 hour  Intake 477 ml  Output --  Net 477 ml       Pressure Injury 02/09/23 Heel Left Deep Tissue Pressure Injury - Purple or maroon localized area of discolored intact skin or blood-filled blister due to damage of underlying soft tissue from pressure and/or shear. Black/purple and boggy,  size of a quar (Active)  02/09/23 0543  Location: Heel  Location Orientation: Left  Staging: Deep Tissue Pressure Injury - Purple or maroon localized area of discolored intact skin or blood-filled blister due to damage of underlying soft tissue from pressure and/or shear.  Wound Description (Comments): Black/purple and boggy,  size of a quarter.  Foam dressing applied  Present on Admission: No    Physical Exam: Vital Signs Blood pressure 116/85, pulse 80, temperature 98.4 F (36.9 C), resp. rate 18, height 5\' 1"  (1.549 m), weight 46.1 kg, SpO2 100%.  Constitutional: No distress . Vital signs reviewed. HEENT: NCAT, EOMI, oral membranes moist Neck: supple Cardiovascular: RRR without murmur. No JVD    Respiratory/Chest: CTA Bilaterally without wheezes or rales. Normal effort    GI/Abdomen: BS +, non-tender,  non-distended Ext: no clubbing, cyanosis, or edema Psych: flat  Skin: + Scaling dried skin on bilateral heels  - 2 small excoriations, sore on the base of the 5th metatarsal head -no longer present - rash on face - no longer present - stage 2 to right toe -no longer present  + area of redness between left thumb and index finger-now minimal, purple bruise left buttock unstageable - not examined +redness to bilateral elbows -not appreciated + Left heel stage III DTI, covered in foam dressing  Neurologic: AAOx2,  to year but not month or date. Follows most simple commands.  + Hypophonation, speech intelligability much improved  Prior exams: MAS 1 right elbow,MAS 1 in RIght finger and wrist flexors  MAS 3 knee flexors, and MAS 3 L>R  hip adductors  MAS 3 L elbow , MAS 3-4 in L wrist and finger flexors  + LUE WHO--- Hypersensitivity to left hand improved  Motor 0/5 strength in LUE and LLE, showing volitional movements in right lower extremity but difficulty to obtain MMT due to her pain/braces, able to do upper body dressing now! Exam stable 10/31  Twice daily for right upper extremity 1/5 finger flexion, otherwise no volitional movement of left upper extremity or bilateral lowers + BL UE WHO.    Musculoskeletal: reduced bilateral shoulder and elbow ROM due to increased tone , pain with passive knee  extension bilaterally   . GU: bladder incontinence  Assessment/Plan: 1. Functional deficits which require 3+ hours per day of interdisciplinary therapy in a comprehensive inpatient rehab setting. Physiatrist is providing close team supervision and 24 hour management of active medical problems listed below. Physiatrist and rehab team continue to assess barriers to discharge/monitor patient progress toward functional and medical goals  Care Tool:  Bathing    Body parts bathed by patient: Face, Abdomen, Chest, Left upper leg, Right upper leg, Left arm, Right lower leg, Left lower leg    Body parts bathed by helper: Right arm, Left arm, Front perineal area, Buttocks, Left lower leg, Right lower leg Body parts n/a: Right arm, Left arm, Front perineal area, Buttocks, Right upper leg, Left upper leg, Right lower leg, Left lower leg   Bathing assist Assist Level: Moderate Assistance - Patient 50 - 74%     Upper Body Dressing/Undressing Upper body dressing   What is the patient wearing?: Pull over shirt    Upper body assist Assist Level: Minimal Assistance - Patient > 75%    Lower Body Dressing/Undressing Lower body dressing      What is the patient wearing?: Pants, Incontinence brief     Lower body assist Assist for lower body dressing: Maximal Assistance - Patient 25 - 49%     Toileting Toileting Toileting Activity did not occur (Clothing management and hygiene only): N/A (no void or bm)  Toileting assist Assist for toileting: Dependent - Patient 0%     Transfers Chair/bed transfer  Transfers assist  Chair/bed transfer activity did not occur: Safety/medical concerns  Chair/bed transfer assist level: Maximal Assistance - Patient 25 - 49%     Locomotion Ambulation   Ambulation assist   Ambulation activity did not occur: Safety/medical concerns  Assist level: 2 helpers   Max distance: 13ft   Walk 10 feet activity   Assist  Walk 10 feet activity did not occur: Safety/medical concerns  Assist level: 2 helpers     Walk 50 feet activity   Assist Walk 50 feet with 2 turns activity did not occur: Safety/medical concerns         Walk 150 feet activity   Assist Walk 150 feet activity did not occur: Safety/medical concerns         Walk 10 feet on uneven surface  activity   Assist Walk 10 feet on uneven surfaces activity did not occur: Safety/medical concerns         Wheelchair     Assist Is the patient using a wheelchair?: Yes Type of Wheelchair: Manual    Wheelchair assist level: Dependent - Patient 0% Max wheelchair  distance: 43ft    Wheelchair 50 feet with 2 turns activity    Assist    Wheelchair 50 feet with 2 turns activity did not occur: Safety/medical concerns   Assist Level: Dependent - Patient 0%   Wheelchair 150 feet activity     Assist  Wheelchair 150 feet activity did not occur: Safety/medical concerns   Assist Level: Dependent - Patient 0%   Blood pressure 116/85, pulse 80, temperature 98.4 F (36.9 C), resp. rate 18, height 5\' 1"  (1.549 m), weight 46.1 kg, SpO2 100%.  Medical Problem List and Plan: 1. 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 Code status  DNR -Continue CIR therapies including PT, OT, and SLP  Team conference 10/23   2.  Impaired mobility: continue Lovenox, weekly creatinine ordered             -antiplatelet therapy: N/A  3. Knee paint: Tylenol as needed, continue voltaren gel scheduled.   XR reviewed and is stable, discussed with therapy  4. History of anxiety: discussed with her mom that she feels this was a big risk factor for her accident, propanolol ordered.   5. Neuropsych/cognition: This patient is not capable of making decisions on her own behalf  - 10/12: Still not capable of medical decision making; BUT oriented and communicating, following directions - MUCH improved .Will add PRN melatonin 3mg  10/20 for insomnia 6. Left buttock unstageable ulcer.  Continue Medihoney to buttock wound daily cover with foam dressing.  Changing dressing every 3 days or as needed  soiling   7. Fluids/Electrolytes/Nutrition: Routine in and outs with follow-up chemistries  -Eating  well with cueing  8.  Cavitary right lower lobe pneumonia likely aspiration pneumonia/MRSA pneumonia.  Resolved    Latest Ref Rng & Units 02/05/2023   11:12 AM 01/22/2023    6:36 AM 12/20/2022   10:54 AM  CBC  WBC 4.0 - 10.5 K/uL 8.5  8.1  5.6   Hemoglobin 12.0 - 15.0 g/dL 16.1  09.6  04.5   Hematocrit 36.0 - 46.0 % 46.7  47.5  42.8   Platelets 150 - 400 K/uL 284  274  253      9.  History of drug abuse.  Positive cocaine on urine drug screen.  Provide counseling  10.  AKI/hypovolemia and ATN.  Resolved Appears to have fair fluid intake, creatinine has been stable    Latest Ref Rng & Units 03/09/2023    6:13 AM 03/02/2023    7:24 AM 02/23/2023   10:37 AM  BMP  Creatinine 0.44 - 1.00 mg/dL 4.09  8.11  9.14     11.  Mild transaminitis with rhabdomyolysis.  Both resolved  12.  Hypotension. Resolved, d/c midodrine  -BP stable    13. Impaired initiation: continue amantadine 100mg  BID  14. Hyperlycemia: CBGs mildly elevated, d/ced checks  15. Spasticity:   -discussed serial casting with OT and her mom  - continue baclofen to 20mg  TID and tizanidine to 4mg  TID. Continue wrist, elbow brace, and PRAFOs. Ordered a night splint for LLE  Repeat CMP today given chronic tizanidine use  -9/7 continues to have significant tone despite use of tizanidine and baclofen, may be beneficial consider baclofen pump in outpatient setting if this causes significant difficulties in care  9/8 consider decrease baclofen if dyskinetic movements continue  10/5-6  continue orthotics, ROM, positioning  when possible 16. Bowel and bladder incontinence: continue bowel and bladder program  17. Left hand skin maceration: Eucerin ordered, discussed hand split with OT, continue  18. History of magnesium deficiency: magnesium 500mg  started HS  19. HTN: see above Increase tizanidine to 6mg  TID. Magnesium  supplement added HS. Increase propanolol to 20mg  TID, continue this dose, d/c clonidine patch given hypotension    03/15/2023    4:55 AM 03/14/2023    7:37 PM 03/14/2023  1:26 PM  Vitals with BMI  Systolic 116 94 100  Diastolic 85 64 76  Pulse 80 80 84     20. Area of redness between left thumb and index finger: continue foam dressing  21. Scalp eczema: selsun blue ordered, continue  -9/7 patient's mother feels like this is causing her to itch, will start some hydrocortisone cream  22. Fatigue: improved, discussed with team may be secondary to anti-spasticity medications, B12 reviewed and is 335, in suboptimal range, will give 1,000U B12 injection 8/7, will order monthly as needed, metanx started, increase to BID, continue  23. Right wrist swelling: XR ordered and shows no acute fracture, shows 2.39mm ulnar variance, brace removed to given patient a break from it. Voltaren gel ordered prn for discomfort, continue, CT ordered and show no acute osseus injury, continue ace wrap  23. Dysphagia: continue D3/thins--tolerating this diet  24. MRSA nares positive: negative on repeat, precautions d/ced  25. Tachycardic: resolved, d/c propanolol.  continueTizanidine increased to 6mg  TID. Resolved, amantadine increased back to 100mg  BID   10/28 HR controlled in 80's 26.  Dyskinetic movements in head and neck - appears to be myoclonic jerks related to hypoxic brain injury - as discussed with pt mom, no specific meds for this on antispasticity meds , consioder benzo although with pt's SUD would avoid this class  EEG reviewed and was negative for seizure.  Decrease baclofen to 15mg  TID  27. Small sore on the base of 5th metatarsal head: local wound care, continue  28. Rash on face: resolved, discussed with mother could have been secondary to added sugars in nutritional supplements  29. Sacral pressure injury: air mattress ordered, Placed nursing order to offload q1 hour while awake, continue  medihoney, wound care consulted, discussed with nursing that this is improving  30. Cervical extensor weakness: tried decreasing tizanidine but spasticity appears increased and BP elevated so will increase back to 6mg  TID, continue this dose, continue cervical collar  31. Low protein stores: prosource ordered, continue  32. Intermittent lethargy: labs reviewed and are stable, discussed with team may be secondary to anti-spasticity medications  33. Blood blister to left heel: continue foam heel protectors.  34. Suboptimal vitamin D level: increase to 5,000 U daily. continue this dose  35. Hypotension: resolved, decrease propanolol to daily. decrease baclofen to 15 mg TID, continue this dose, BP reviewed and well controlled, continue current regimen  26. Foot pain: tramadol ordered prn, improved today, continue prn  27. S/p fall: CT head reviewed and is stable  28. Bladder incontinence: continue bladder program  29. Headaches: continue tylenol and tramadol prn        LOS: 117 days A FACE TO FACE EVALUATION WAS PERFORMED  Ralene Gasparyan P Harry Shuck 03/15/2023, 10:10 AM

## 2023-03-15 NOTE — Progress Notes (Signed)
Nutrition Follow-up  DOCUMENTATION CODES:   Severe malnutrition in context of acute illness/injury  INTERVENTION:  Double protein portions with meals 30 ml ProSource Plus BID, each supplement provides 100 kcals and 15 grams protein.  MVI with minerals daily Vitamin C 500mg  daily to promote wound healing Request updated measured weight  NUTRITION DIAGNOSIS:   Severe Malnutrition related to acute illness as evidenced by moderate fat depletion, moderate muscle depletion. - remains applicable  GOAL:   Patient will meet greater than or equal to 90% of their needs - progressing, addressing via meals and nutrition supplements  MONITOR:   PO intake, Supplement acceptance  REASON FOR ASSESSMENT:   Malnutrition Screening Tool    ASSESSMENT:   47 y.o. 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 PMH includes: anxiety, asthma, depression.  Noted plan of care for possible d/c to SNF.  Attempted to speak with pt between therapy sessions however pt out of room. Pt remains with good PO intake. Receiving double protein portions with meals. Continues to consume prosource supplements BID.   Meal completions: 10/28: 100% breakfast, 75% lunch, 100% dinner 10/29: 100% x breakfast and lunch 10/30: 100% breakfast and dinner 10/31: 100% breakfast  No updated measured weight on file to review since 10/5.   Medications: Vitamin C 500mg  daily, Vitamin D3 5,000 units daily, metanx, magnonate, MVI  Labs: reviewed  Diet Order:   Diet Order             DIET DYS 3 Room service appropriate? Yes; Fluid consistency: Thin  Diet effective now                   EDUCATION NEEDS:   Not appropriate for education at this time  Skin:  Skin Assessment: Reviewed RN Assessment  Last BM:  10/28  Height:   Ht Readings from Last 1 Encounters:  11/18/22 5\' 1"  (1.549 m)    Weight:   Wt Readings  from Last 1 Encounters:  02/17/23 46.1 kg   BMI:  Body mass index is 19.2 kg/m.  Estimated Nutritional Needs:   Kcal:  1600-1800  Protein:  80-100 gm  Fluid:  >/= 1.6 L  Drusilla Kanner, RDN, LDN Clinical Nutrition

## 2023-03-15 NOTE — Progress Notes (Signed)
Physical Therapy Session Note  Patient Details  Name: ALEGNA VANDERKOLK MRN: [id number] Date of Birth: Dec 27, 1975  Today's Date: 03/15/2023 PT Individual Time: 1130-1203 PT Individual Time Calculation (min): 33 min   Short Term Goals: Week 2:  PT Short Term Goal 1 (Week 2): Week 14; pt will complete bed mobility with totalA (pt <25%) PT Short Term Goal 2 (Week 2): Week 14: pt will maintain unsupported sitting balance for up to 1 minute consistently with CGA PT Short Term Goal 3 (Week 2): Week 14: Pt will complete bed<>chair transfers with totalA (pt <25%) PT Short Term Goal 4 (Week 2): Pt will be assessed for custom wheelchair needs  Skilled Therapeutic Interventions/Progress Updates:      Pt sitting upright in w/c to start. Awake and reports "I want to walk" without prompting. Transported patient in w/c to // bars. Pt able to stand with +2 mod/maxA (partially stand) x2 times, limited upright, extending posteriorly, and painful with her legs (likely tone related with decreased ankle DF ROM). After this, patient reporting need to have a BM. Transported back to her room and assisted onto the toilet with maxA squat pivot transfer. +2 assist needed for managing dirty brief (incontinent of urine) and pants. Pt then continent of large BM needing minA for sitting balance due L trunk lean. Needing occasional assist for repositioning due to pelvic obliquity. +2 assist for pericare and donning a new clean brief. +2 assist for returning to the wheelchair. Left with seat belt alarm on, all needs met.   Therapy Documentation Precautions:  Precautions Precautions: Fall Precaution Comments: Contact precautions & delayed processing Required Braces or Orthoses: Other Brace Other Brace: B adjustable night splints; B elbow "cosey" splints; L palm protector; R mitt Restrictions Weight Bearing Restrictions: No General:      Therapy/Group: Individual Therapy  Orrin Brigham 03/15/2023, 7:52 AM

## 2023-03-15 NOTE — Group Note (Signed)
Patient Details Name: KARRON KOVACEVIC MRN: [id number] DOB: 06/24/1975 Today's Date: 03/15/2023  Time Calculation: OT Group Time Calculation OT Group Start Time: 1430 OT Group Stop Time: 1530 OT Group Time Calculation (min): 60 min      Group Description: Dance Group: Pt participated in dance group with an emphasis on social interaction, motor planning, increasing overall activity tolerance and bimanual tasks. All songs were selected by group members. Dance moves included AROM of BUE/BLE gross motor movements with an emphasis on building functional endurance.    Individual level documentation: Patient completed group from sitting level. Patientt needed supervision-min A to complete various dance moves with cues for attention/awareness and increased therapeutic support to improved motivation and moral.  Patient needed min modifications during group. Pt participatory throughout group- interacting with other patients and therapists with min verbal cues. When prompted, Pt able to recall and verbalize name of familiar songs and artists. Pt also demonstrating increased awareness into deficits stating "I had fun, but I wish I was more flexible so I could do more".   Pain: Pain Assessment Pain Scale: 0-10 Pain Score: 0-No pain  Precautions:  Falls   Danira Babayan Woodson 03/15/2023, 4:59 PM

## 2023-03-16 LAB — CREATININE, SERUM
Creatinine, Ser: 0.57 mg/dL (ref 0.44–1.00)
GFR, Estimated: >60 mL/min (ref >60)

## 2023-03-16 MED ORDER — BACLOFEN 10 MG PO TABS
10.0000 mg | ORAL_TABLET | Freq: Three times a day (TID) | ORAL | Status: DC
  Administered 2023-03-16 – 2023-03-20 (×12): 10 mg via ORAL
  Filled 2023-03-16 (×13): qty 1

## 2023-03-16 NOTE — Progress Notes (Signed)
Speech Language Pathology Weekly Progress Note  Patient Details  Name: Nicole [name] MRN: [id number] Date of Birth: 1976-01-21  Beginning of progress report period: March 09, 2023 End of progress report period: March 16, 2023   Short Term Goals: Week 16: SLP Short Term Goal 1 (Week 16): Week 16 - Pt will perform verbal problem solving tasks with overall Min A verbal and visual cues to verbalize solutions. SLP Short Term Goal 1 - Progress (Week 16): Not met SLP Short Term Goal 2 (Week 16): Week 16-Patient will utilize external aids for orientation to place and time with supervision level verbal cues SLP Short Term Goal 2 - Progress (Week 16): Not met SLP Short Term Goal 3 (Week 16): Week 16 - Pt will demonstrate sustained attention to basic tasks for 8 minutes with Mod A multimodal cues for redirection. SLP Short Term Goal 3 - Progress (Week 16): Met SLP Short Term Goal 4 (Week 16): Week 16 - Pt will utilize increased vocal intensity w/ MinA verbal cues to remain 90% intelligibile at phrase level SLP Short Term Goal 4 - Progress (Week 16): Not met    New Short Term Goals: Week 17: SLP Short Term Goal 1 (Week 17): Week 17 - Pt will perform verbal problem solving tasks with overall Min A verbal and visual cues to verbalize solutions. SLP Short Term Goal 2 (Week 17): Week 17-Patient will utilize external aids for orientation to place and time with supervision level verbal cues SLP Short Term Goal 3 (Week 17): Week 17 - Pt will demonstrate sustained attention to basic tasks for 12 minutes with Mod A multimodal cues for redirection. SLP Short Term Goal 4 (Week 17): Week 17 - Pt will utilize increased vocal intensity w/ MinA verbal cues to remain 90% intelligibile at phrase level  Weekly Progress Updates:  Patient has made slow and inconsistent progress this reporting period. Currently, patient continues to require overall Mod-Max A multimodal cues for use of speech intelligibility strategies to  improve speech intelligibility at the phrase and sentence level. Patient also demonstrates improved orientation to place and situation but requires Min-Mod A verbal and visual cues for use of external aids for orientation to time. Although, patient continues to be limited by fatigue at times, patient demonstrates improved sustained attention to tasks with overall Mod A verbal cues are needed for redirection. As patient's overall functional communication and attention improve, SLP is providing higher-level verbal problem solving tasks in which the patient requires overall Mod-Max A multimodal cues to complete. Patient and family education ongoing. Patient would benefit from continued skilled SLP intervention to maximize her cognitive functioning and functional communication in order to reduce caregiver burden.      Intensity: Minumum of 1-2 x/day, 30 to 90 minutes Frequency: 1 to 3 out of 7 days (QD) Duration/Length of Stay: TBD due to SNF placement Treatment/Interventions: Cognitive remediation/compensation;Internal/external aids;Speech/Language facilitation;Cueing hierarchy;Environmental controls;Therapeutic Activities;Functional tasks;Multimodal communication approach;Patient/family education;Therapeutic Exercise    [name] 03/16/2023, 6:38 AM

## 2023-03-16 NOTE — Progress Notes (Signed)
PROGRESS NOTE   Subjective/Complaints: No new complaints this morning Headaches are well controlled Doing great with OT performing self care at sink  ROS: Patient denies fever, rash, sore throat, blurred vision, dizziness, nausea, vomiting, diarrhea, cough, shortness of breath or chest pain, joint or back/neck pain, headache. +bladder incontinence, +migraines  Objective:   No results found. No results for input(s): "WBC", "HGB", "HCT", "PLT" in the last 72 hours.     Recent Labs    03/16/23 0653  CREATININE 0.57     Intake/Output Summary (Last 24 hours) at 03/16/2023 1610 Last data filed at 03/16/2023 0802 Gross per 24 hour  Intake 937 ml  Output --  Net 937 ml       Pressure Injury 02/09/23 Heel Left Deep Tissue Pressure Injury - Purple or maroon localized area of discolored intact skin or blood-filled blister due to damage of underlying soft tissue from pressure and/or shear. Black/purple and boggy,  size of a quar (Active)  02/09/23 0543  Location: Heel  Location Orientation: Left  Staging: Deep Tissue Pressure Injury - Purple or maroon localized area of discolored intact skin or blood-filled blister due to damage of underlying soft tissue from pressure and/or shear.  Wound Description (Comments): Black/purple and boggy,  size of a quarter.  Foam dressing applied  Present on Admission: No    Physical Exam: Vital Signs Blood pressure 99/67, pulse 71, temperature 97.7 F (36.5 C), resp. rate 18, height 5\' 1"  (1.549 m), weight 46.1 kg, SpO2 100%.  Constitutional: No distress . Vital signs reviewed. HEENT: NCAT, EOMI, oral membranes moist Neck: supple Cardiovascular: RRR without murmur. No JVD    Respiratory/Chest: CTA Bilaterally without wheezes or rales. Normal effort    GI/Abdomen: BS +, non-tender, non-distended Ext: no clubbing, cyanosis, or edema Psych: flat  Skin: + Scaling dried skin on bilateral  heels  - 2 small excoriations, sore on the base of the 5th metatarsal head -no longer present - rash on face - no longer present - stage 2 to right toe -no longer present  + area of redness between left thumb and index finger-now minimal, purple bruise left buttock unstageable - not examined +redness to bilateral elbows -not appreciated + Left heel stage III DTI, covered in foam dressing  Neurologic: AAOx2,  to year but not month or date. Follows most simple commands.  + Hypophonation, speech intelligability much improved  Prior exams: MAS 1 right elbow,MAS 1 in RIght finger and wrist flexors  MAS 3 knee flexors, and MAS 3 L>R  hip adductors  MAS 3 L elbow , MAS 3-4 in L wrist and finger flexors  + LUE WHO--- Hypersensitivity to left hand improved  Motor 0/5 strength in LUE and LLE, showing volitional movements in right lower extremity but difficulty to obtain MMT due to her pain/braces, able to do upper body dressing now! Exam stable 11/1  Twice daily for right upper extremity 1/5 finger flexion, otherwise no volitional movement of left upper extremity or bilateral lowers + BL UE WHO.    Musculoskeletal: reduced bilateral shoulder and elbow ROM due to increased tone , pain with passive knee extension bilaterally   . GU: bladder incontinence  Assessment/Plan: 1. Functional deficits which require 3+ hours per day of interdisciplinary therapy in a comprehensive inpatient rehab setting. Physiatrist is providing close team supervision and 24 hour management of active medical problems listed below. Physiatrist and rehab team continue to assess barriers to discharge/monitor patient progress toward functional and medical goals  Care Tool:  Bathing    Body parts bathed by patient: Face, Abdomen, Chest, Left upper leg, Right upper leg, Left arm, Right lower leg, Left lower leg   Body parts bathed by helper: Right arm, Left arm, Front perineal area, Buttocks, Left lower leg, Right lower  leg Body parts n/a: Right arm, Left arm, Front perineal area, Buttocks, Right upper leg, Left upper leg, Right lower leg, Left lower leg   Bathing assist Assist Level: Moderate Assistance - Patient 50 - 74%     Upper Body Dressing/Undressing Upper body dressing   What is the patient wearing?: Pull over shirt    Upper body assist Assist Level: Minimal Assistance - Patient > 75%    Lower Body Dressing/Undressing Lower body dressing      What is the patient wearing?: Pants, Incontinence brief     Lower body assist Assist for lower body dressing: Maximal Assistance - Patient 25 - 49%     Toileting Toileting Toileting Activity did not occur (Clothing management and hygiene only): N/A (no void or bm)  Toileting assist Assist for toileting: Dependent - Patient 0%     Transfers Chair/bed transfer  Transfers assist  Chair/bed transfer activity did not occur: Safety/medical concerns  Chair/bed transfer assist level: Maximal Assistance - Patient 25 - 49%     Locomotion Ambulation   Ambulation assist   Ambulation activity did not occur: Safety/medical concerns  Assist level: 2 helpers   Max distance: 40ft   Walk 10 feet activity   Assist  Walk 10 feet activity did not occur: Safety/medical concerns  Assist level: 2 helpers     Walk 50 feet activity   Assist Walk 50 feet with 2 turns activity did not occur: Safety/medical concerns         Walk 150 feet activity   Assist Walk 150 feet activity did not occur: Safety/medical concerns         Walk 10 feet on uneven surface  activity   Assist Walk 10 feet on uneven surfaces activity did not occur: Safety/medical concerns         Wheelchair     Assist Is the patient using a wheelchair?: Yes Type of Wheelchair: Manual    Wheelchair assist level: Dependent - Patient 0% Max wheelchair distance: 49ft    Wheelchair 50 feet with 2 turns activity    Assist    Wheelchair 50 feet with 2  turns activity did not occur: Safety/medical concerns   Assist Level: Dependent - Patient 0%   Wheelchair 150 feet activity     Assist  Wheelchair 150 feet activity did not occur: Safety/medical concerns   Assist Level: Dependent - Patient 0%   Blood pressure 99/67, pulse 71, temperature 97.7 F (36.5 C), resp. rate 18, height 5\' 1"  (1.549 m), weight 46.1 kg, SpO2 100%.  Medical Problem List and Plan: 1. 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 Code status  DNR -Continue CIR therapies including PT, OT, and SLP  Team conference 10/23   2.  Impaired mobility: continue Lovenox, weekly creatinine ordered             -antiplatelet therapy: N/A  3. Knee paint: Tylenol as needed, continue voltaren gel scheduled.   XR reviewed and is stable, discussed with therapy  4. History of anxiety: discussed with her mom that she feels this was a big risk factor for her accident, propanolol ordered.   5. Neuropsych/cognition: This patient is not capable of making decisions on her own behalf  - 10/12: Still not capable of medical decision making; BUT oriented and communicating, following directions - MUCH improved .Will add PRN melatonin 3mg  10/20 for insomnia 6. Left buttock unstageable ulcer.  Continue Medihoney to buttock wound daily cover with foam dressing.  Changing dressing every 3 days or as needed soiling   7. Fluids/Electrolytes/Nutrition: Routine in and outs with follow-up chemistries  -Eating   well with cueing  8.  Cavitary right lower lobe pneumonia likely aspiration pneumonia/MRSA pneumonia.  Resolved    Latest Ref Rng & Units 02/05/2023   11:12 AM 01/22/2023    6:36 AM 12/20/2022   10:54 AM  CBC  WBC 4.0 - 10.5 K/uL 8.5  8.1  5.6   Hemoglobin 12.0 - 15.0 g/dL 46.9  62.9  52.8   Hematocrit 36.0 - 46.0 % 46.7  47.5  42.8   Platelets 150 - 400 K/uL 284  274  253      9.  History of drug abuse.  Positive cocaine on urine drug screen.  Provide counseling  10.  AKI/hypovolemia and ATN.  Resolved Appears to have fair fluid intake, creatinine has been stable    Latest Ref Rng & Units 03/16/2023    6:53 AM 03/09/2023    6:13 AM 03/02/2023    7:24 AM  BMP  Creatinine 0.44 - 1.00 mg/dL 4.13  2.44  0.10     11.  Mild transaminitis with rhabdomyolysis.  Both resolved  12.  Hypotension. Resolved, d/c midodrine  -BP stable    13. Impaired initiation: continue amantadine 100mg  BID  14. Hyperlycemia: CBGs mildly elevated, d/ced checks  15. Spasticity:   -discussed serial casting with OT and her mom  - continue baclofen to 20mg  TID and tizanidine to 4mg  TID. Continue wrist, elbow brace, and PRAFOs. Ordered a night splint for LLE  Repeat CMP today given chronic tizanidine use  -9/7 continues to have significant tone despite use of tizanidine and baclofen, may be beneficial consider baclofen pump in outpatient setting if this causes significant difficulties in care  9/8 consider decrease baclofen if dyskinetic movements continue  10/5-6  continue orthotics, ROM, positioning  when possible 16. Bowel and bladder incontinence: continue bowel and bladder program  17. Left hand skin maceration: Eucerin ordered, discussed hand split with OT, continue  18. History of magnesium deficiency: magnesium 500mg  started HS  19. HTN: see above Increase tizanidine to 6mg  TID. Magnesium supplement added HS. Increase propanolol to 20mg  TID, continue this dose, d/c clonidine patch given  hypotension    03/16/2023    6:04 AM 03/15/2023    7:20 PM 03/15/2023    1:26 PM  Vitals with BMI  Systolic 99 99 98  Diastolic 67 79 66  Pulse 71 92 88     20. Area of redness between left thumb and index finger: continue foam dressing  21. Scalp eczema: selsun blue ordered, continue  -9/7 patient's mother feels like this is causing her to itch, will start some hydrocortisone cream  22. Fatigue: improved, discussed with team may be secondary to anti-spasticity medications, B12 reviewed and is 335, in suboptimal range, will give 1,000U B12 injection 8/7, will order monthly as needed, metanx started, increase to BID, continue  23. Right wrist swelling: XR ordered and shows no acute fracture, shows 2.20mm ulnar variance, brace removed to given patient a break from it. Voltaren gel ordered prn for discomfort, continue, CT ordered and show no acute osseus injury, continue ace wrap  23. Dysphagia: continue D3/thins--tolerating this diet  24. MRSA nares positive: negative on repeat, precautions d/ced  25. Tachycardic: resolved, d/c propanolol.  continueTizanidine increased to 6mg  TID. Resolved, amantadine increased back to 100mg  BID   10/28 HR controlled in 80's 26.  Dyskinetic movements in head and neck - appears to be myoclonic jerks related to hypoxic brain injury - as discussed with pt mom, no specific meds for this on antispasticity meds , consioder benzo although with pt's SUD would avoid this class  EEG reviewed and was negative for seizure.  Decrease baclofen to 15mg  TID  27. Small sore on the base of 5th metatarsal head: local wound care, continue  28. Rash on face: resolved, discussed with mother could have been secondary to added sugars in nutritional supplements  29. Sacral pressure injury: air mattress ordered, Placed nursing order to offload q1 hour while awake, continue medihoney, wound care consulted, discussed with nursing that this is improving  30. Cervical extensor  weakness: tried decreasing tizanidine but spasticity appears increased and BP elevated so will increase back to 6mg  TID, continue this dose, continue cervical collar  31. Low protein stores: prosource ordered, continue  32. Intermittent lethargy: labs reviewed and are stable, discussed with team may be secondary to anti-spasticity medications  33. Blood blister to left heel: continue foam heel protectors.  34. Suboptimal vitamin D level: increase to 5,000 U daily. continue this dose  35. Hypotension: resolved, decrease propanolol to daily. decrease baclofen to 10 mg TID, continue this dose, BP reviewed and well controlled, continue current regimen  26. Foot pain: tramadol ordered prn, improved today, continue prn  27. S/p fall: CT head reviewed and is stable  28. Bladder incontinence: continue bladder program  29. Headaches: continue tylenol and tramadol prn        LOS: 118 days A FACE TO FACE EVALUATION WAS PERFORMED  Joel Mericle P Lorree Millar 03/16/2023, 9:22 AM

## 2023-03-16 NOTE — Progress Notes (Signed)
Patient ID: Nicole [name], female   DOB: [birth year], 47 y.o.   MRN: [id number]  Finally spoke with [facility] who reports no bed at this time, but will be on waiting list. Mom wanted pt taken off confidential and this was done. Trying to change insurance on chart to reflect medicaid approved and [employer] not payer for SNF, but not successful yet. Have reached out to facilities to inform of medicaid approval and see if can offer a long term care bed. No offers as of yet

## 2023-03-16 NOTE — Progress Notes (Signed)
Physical Therapy Weekly Progress Note  Patient Details  Name: Nicole [name] MRN: [id number] Date of Birth: 09/11/1975  Beginning of progress report period: February 27, 2023 End of progress report period: March 16, 2023  Today's Date: 03/16/2023 PT Individual Time: 1015-1045 PT Individual Time Calculation (min): 30 min   Patient has met 0 of 3 short term goals.  Pt is showing improved initiation and awareness. Still max/totalA for functional mobility and care.   Patient continues to demonstrate the following deficits muscle weakness and muscle joint tightness, decreased cardiorespiratoy endurance, abnormal tone, unbalanced muscle activation, and motor apraxia, decreased attention to left, decreased initiation, decreased attention, decreased awareness, decreased problem solving, decreased safety awareness, decreased memory, and delayed processing, and decreased sitting balance, decreased standing balance, decreased postural control, hemiplegia, and decreased balance strategies and therefore will continue to benefit from skilled PT intervention to increase functional independence with mobility.  Patient progressing toward long term goals..  Continue plan of care.  PT Short Term Goals Week 3:  PT Short Term Goal 1 (Week 3): Pt will initiate bed mobility but require no more than totalA PT Short Term Goal 1 - Progress (Week 3): Not met PT Short Term Goal 2 (Week 3): Pt will maintain sitting balance unsupported for 30 seconds PT Short Term Goal 2 - Progress (Week 3): Not met PT Short Term Goal 3 (Week 3): Pt will demonstrate command follow for ~15% of tasks. PT Short Term Goal 3 - Progress (Week 3): Not met  Skilled Therapeutic Interventions/Progress Updates:      Pt sitting up in wheelchair on arrival. Reports "I want to call my mom" without prompting. Tried to setup her housephone but unable to get a dial tone - RN made aware. Used the phone at the nurses station - patient able to recall her  mom's phone # by memory - spoke to her mom via phone, asking her to bring her some more pants.   Patient wanting to work on wheelchair mobility. Tried instructing her on wheelchair mobility using hemi technique with R side as well as pushing with both UE 's. Patient able to use her RUE but unable to use her LUE functionally, resulting in turning in circles only. Tried removing wheelchair cushion to improve floor to seat height for hemi technique but patient unable to use RLE functionally to foot propel.  Returned to her room and patient ended treatment sitting in w/c with seat belt alarm on.   Therapy Documentation Precautions:  Precautions Precautions: Fall Precaution Comments: Contact precautions & delayed processing Required Braces or Orthoses: Other Brace Other Brace: B adjustable night splints; B elbow "cosey" splints; L palm protector; R mitt Restrictions Weight Bearing Restrictions: No General:    Therapy/Group: Individual Therapy  Gabriele Zwilling P Perla Echavarria 03/16/2023, 7:45 AM

## 2023-03-16 NOTE — Progress Notes (Signed)
Occupational Therapy Session Note  Patient Details  Name: Nicole [name] MRN: [id number] Date of Birth: [birth year]  Today's Date: 03/16/2023 OT Individual Time: 5621-3086 OT Individual Time Calculation (min): 28 min    Short Term Goals: Week 6:  OT Short Term Goal 1 (Week 6): Patient will indicate need to void an dassist with trasnfer with max assist OT Short Term Goal 1 - Progress (Week 6): Other (comment) OT Short Term Goal 2 (Week 6): Patient will utilize left hand x 3 to aide with bimaual tasks with min cueing OT Short Term Goal 2 - Progress (Week 6): Other (comment) OT Short Term Goal 3 (Week 6): Patient will pull self to partial standing with mod assist OT Short Term Goal 3 - Progress (Week 6): Other (comment)  Skilled Therapeutic Interventions/Progress Updates:    Patient received supine in bed watching tv show and following storyline.  Patient agreeable to OT session.  Patient assisting to roll toward left and to sit at edge of bed - mod assist.  Patient stabilizing self in sitting at edge of bed.  Patient showing improved ability to scoot forward so feet on floor - having greater difficulty having feet contact floor to lift off and scoot toward R/L as needed for squat pivot transfer.  Patient able to don pull over shirt after set up with intermittent min assist and increased time.  Patient able to don pants over right leg - assist for left leg and then used sink to pull to lift off position/ partial stand to allow pants to be pulled over hips.   Completed grooming at sink following set up. Needing consistent cueing to visually locate items left of midline.   Eft up in wheelchair with safety belt in place and engaged and call bell in reach.    Therapy Documentation Precautions:  Precautions Precautions: Fall Precaution Comments: Contact precautions & delayed processing Required Braces or Orthoses: Other Brace Other Brace: B adjustable night splints; B elbow "cosey" splints; L palm  protector; R mitt Restrictions Weight Bearing Restrictions: No  Pain:  None indicated at rest - pain in left knee and foot with movement/ pressure      Therapy/Group: Individual Therapy  [name] 03/16/2023, 9:09 AM

## 2023-03-17 NOTE — Progress Notes (Signed)
PROGRESS NOTE   Subjective/Complaints:  Pt reports no pain, but "had pain I head last week"-  LBM yesterday and day prior.     ROS:  Pt denies SOB, abd pain, CP, N/V/C/D, and vision changes . +bladder incontinence, +migraines  Objective:   No results found. No results for input(s): "WBC", "HGB", "HCT", "PLT" in the last 72 hours.     Recent Labs    03/16/23 0653  CREATININE 0.57     Intake/Output Summary (Last 24 hours) at 03/17/2023 1415 Last data filed at 03/17/2023 0811 Gross per 24 hour  Intake 835 ml  Output --  Net 835 ml       Pressure Injury 02/09/23 Heel Left Deep Tissue Pressure Injury - Purple or maroon localized area of discolored intact skin or blood-filled blister due to damage of underlying soft tissue from pressure and/or shear. Black/purple and boggy,  size of a quar (Active)  02/09/23 0543  Location: Heel  Location Orientation: Left  Staging: Deep Tissue Pressure Injury - Purple or maroon localized area of discolored intact skin or blood-filled blister due to damage of underlying soft tissue from pressure and/or shear.  Wound Description (Comments): Black/purple and boggy,  size of a quarter.  Foam dressing applied  Present on Admission: No    Physical Exam: Vital Signs Blood pressure (!) 97/58, pulse 79, temperature 98.9 F (37.2 C), resp. rate 18, height 5\' 1"  (1.549 m), weight 46.1 kg, SpO2 99%.    General: awake, alert, sitting up in bed; nursing at bedside;  NAD HENT: conjugate gaze; oropharynx moist CV: regular rate and rhythm; no JVD Pulmonary: CTA B/L; no W/R/R- good air movement GI: soft, NT, ND, (+)BS Psychiatric: appropriate- very flat Neurological: poor to fair memory Skin: + Scaling dried skin on bilateral heels  - 2 small excoriations, sore on the base of the 5th metatarsal head -no longer present - rash on face - no longer present - stage 2 to right toe -no longer  present  + area of redness between left thumb and index finger-now minimal, purple bruise left buttock unstageable - not examined +redness to bilateral elbows -not appreciated + Left heel stage III DTI, covered in foam dressing  Neurologic: AAOx2,  to year but not month or date. Follows most simple commands.  + Hypophonation, speech intelligability much improved  Prior exams: MAS 1 right elbow,MAS 1 in RIght finger and wrist flexors  MAS 3 knee flexors, and MAS 3 L>R  hip adductors  MAS 3 L elbow , MAS 3-4 in L wrist and finger flexors  + LUE WHO--- Hypersensitivity to left hand improved  Motor 0/5 strength in LUE and LLE, showing volitional movements in right lower extremity but difficulty to obtain MMT due to her pain/braces, able to do upper body dressing now! Exam stable 11/1  Twice daily for right upper extremity 1/5 finger flexion, otherwise no volitional movement of left upper extremity or bilateral lowers + BL UE WHO.    Musculoskeletal: reduced bilateral shoulder and elbow ROM due to increased tone , pain with passive knee extension bilaterally   . GU: bladder incontinence  Assessment/Plan: 1. Functional deficits which require 3+ hours per  day of interdisciplinary therapy in a comprehensive inpatient rehab setting. Physiatrist is providing close team supervision and 24 hour management of active medical problems listed below. Physiatrist and rehab team continue to assess barriers to discharge/monitor patient progress toward functional and medical goals  Care Tool:  Bathing    Body parts bathed by patient: Face, Abdomen, Chest, Left upper leg, Right upper leg, Left arm, Right lower leg, Left lower leg   Body parts bathed by helper: Right arm, Left arm, Front perineal area, Buttocks, Left lower leg, Right lower leg Body parts n/a: Right arm, Left arm, Front perineal area, Buttocks, Right upper leg, Left upper leg, Right lower leg, Left lower leg   Bathing assist Assist  Level: Moderate Assistance - Patient 50 - 74%     Upper Body Dressing/Undressing Upper body dressing   What is the patient wearing?: Pull over shirt    Upper body assist Assist Level: Minimal Assistance - Patient > 75%    Lower Body Dressing/Undressing Lower body dressing      What is the patient wearing?: Pants, Incontinence brief     Lower body assist Assist for lower body dressing: Maximal Assistance - Patient 25 - 49%     Toileting Toileting Toileting Activity did not occur (Clothing management and hygiene only): N/A (no void or bm)  Toileting assist Assist for toileting: Dependent - Patient 0%     Transfers Chair/bed transfer  Transfers assist  Chair/bed transfer activity did not occur: Safety/medical concerns  Chair/bed transfer assist level: Total Assistance - Patient < 25%     Locomotion Ambulation   Ambulation assist   Ambulation activity did not occur: Safety/medical concerns  Assist level: 2 helpers   Max distance: 84ft   Walk 10 feet activity   Assist  Walk 10 feet activity did not occur: Safety/medical concerns  Assist level: 2 helpers     Walk 50 feet activity   Assist Walk 50 feet with 2 turns activity did not occur: Safety/medical concerns         Walk 150 feet activity   Assist Walk 150 feet activity did not occur: Safety/medical concerns         Walk 10 feet on uneven surface  activity   Assist Walk 10 feet on uneven surfaces activity did not occur: Safety/medical concerns         Wheelchair     Assist Is the patient using a wheelchair?: Yes Type of Wheelchair: Manual    Wheelchair assist level: Dependent - Patient 0% Max wheelchair distance: 52ft    Wheelchair 50 feet with 2 turns activity    Assist    Wheelchair 50 feet with 2 turns activity did not occur: Safety/medical concerns   Assist Level: Dependent - Patient 0%   Wheelchair 150 feet activity     Assist  Wheelchair 150 feet  activity did not occur: Safety/medical concerns   Assist Level: Dependent - Patient 0%   Blood pressure (!) 97/58, pulse 79, temperature 98.9 F (37.2 C), resp. rate 18, height 5\' 1"  (1.549 m), weight 46.1 kg, SpO2 99%.  Medical Problem List and Plan: 1. 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 Code status  DNR Con't CIR PT, OT and SLP 2.  Impaired mobility: continue Lovenox, weekly creatinine ordered             -antiplatelet therapy: N/A  3. Knee paint: Tylenol as needed, continue voltaren gel scheduled.   XR reviewed and is stable, discussed with therapy  4. History of anxiety: discussed with her mom that she feels this was a big risk factor for her accident, propanolol ordered.   5. Neuropsych/cognition: This patient is not capable of making decisions on her own behalf  - 10/12: Still not capable of medical decision making; BUT oriented and communicating, following directions - MUCH improved .Will add PRN melatonin 3mg  10/20 for insomnia 6. Left buttock unstageable ulcer.  Continue Medihoney to buttock wound daily cover with foam dressing.  Changing dressing every 3 days or as needed soiling   7. Fluids/Electrolytes/Nutrition: Routine in and outs with follow-up chemistries  -Eating  well with cueing  8.  Cavitary right lower lobe pneumonia likely aspiration pneumonia/MRSA pneumonia.  Resolved    Latest Ref Rng & Units 02/05/2023   11:12 AM 01/22/2023    6:36 AM 12/20/2022   10:54 AM  CBC  WBC  4.0 - 10.5 K/uL 8.5  8.1  5.6   Hemoglobin 12.0 - 15.0 g/dL 41.3  24.4  01.0   Hematocrit 36.0 - 46.0 % 46.7  47.5  42.8   Platelets 150 - 400 K/uL 284  274  253      9.  History of drug abuse.  Positive cocaine on urine drug screen.  Provide counseling  10.  AKI/hypovolemia and ATN.  Resolved Appears to have fair fluid intake, creatinine has been stable    Latest Ref Rng & Units 03/16/2023    6:53 AM 03/09/2023    6:13 AM 03/02/2023    7:24 AM  BMP  Creatinine 0.44 - 1.00 mg/dL 2.72  5.36  6.44     11.  Mild transaminitis with rhabdomyolysis.  Both resolved  12.  Hypotension. Resolved, d/c midodrine  -BP stable  11/2- BP was running 87 systolic last night- and max of 110 in last 24 hours- not dizzy per chart, but if develops any dizziness, suggest primary team restarts Midodrine. Amantadine can cause hypotension, FYI   13. Impaired initiation: continue amantadine 100mg  BID  14. Hyperlycemia: CBGs mildly elevated, d/ced checks  15. Spasticity:   -discussed serial casting with OT and her mom  - continue baclofen to 20mg  TID and tizanidine to 4mg  TID. Continue wrist, elbow brace, and PRAFOs. Ordered a night splint for LLE  Repeat CMP today given chronic tizanidine use  -9/7 continues to have significant tone despite use of tizanidine and baclofen, may be beneficial consider baclofen pump in outpatient setting if this causes significant difficulties in care  9/8 consider decrease baclofen if dyskinetic movements continue  10/5-6  continue orthotics, ROM, positioning  when possible 16. Bowel and bladder incontinence: continue bowel and bladder program  17. Left hand skin maceration: Eucerin ordered, discussed hand split with OT, continue  18. History of magnesium deficiency: magnesium 500mg  started HS  19. HTN: see above Increase tizanidine to 6mg  TID. Magnesium supplement added HS. Increase propanolol to 20mg  TID, continue this dose, d/c clonidine patch given hypotension     03/17/2023    2:06 PM 03/17/2023  3:27 AM 03/16/2023   10:02 PM  Vitals with BMI  Systolic 97 100 87  Diastolic 58 71 63  Pulse 79 69      20. Area of redness between left thumb and index finger: continue foam dressing  21. Scalp eczema: selsun blue ordered, continue  -9/7 patient's mother feels like this is causing her to itch, will start some hydrocortisone cream  22. Fatigue: improved, discussed with team may be secondary to anti-spasticity medications, B12 reviewed and is 335, in suboptimal range, will give 1,000U B12 injection 8/7, will order monthly as needed, metanx started, increase to BID, continue  23. Right wrist swelling: XR ordered and shows no acute fracture, shows 2.48mm ulnar variance, brace removed to given patient a break from it. Voltaren gel ordered prn for discomfort, continue, CT ordered and show no acute osseus injury, continue ace wrap  23. Dysphagia: continue D3/thins--tolerating this diet  24. MRSA nares positive: negative on repeat, precautions d/ced  25. Tachycardic: resolved, d/c propanolol.  continueTizanidine increased to 6mg  TID. Resolved, amantadine increased back to 100mg  BID   10/28 HR controlled in 80's 26.  Dyskinetic movements in head and neck - appears to be myoclonic jerks related to hypoxic brain injury - as discussed with pt mom, no specific meds for this on antispasticity meds , consioder benzo although with pt's SUD would avoid this class  EEG reviewed and was negative for seizure.  Decrease baclofen to 15mg  TID  27. Small sore on the base of 5th metatarsal head: local wound care, continue  28. Rash on face: resolved, discussed with mother could have been secondary to added sugars in nutritional supplements  29. Sacral pressure injury: air mattress ordered, Placed nursing order to offload q1 hour while awake, continue medihoney, wound care consulted, discussed with nursing that this is improving  30. Cervical extensor weakness: tried  decreasing tizanidine but spasticity appears increased and BP elevated so will increase back to 6mg  TID, continue this dose, continue cervical collar  31. Low protein stores: prosource ordered, continue  32. Intermittent lethargy: labs reviewed and are stable, discussed with team may be secondary to anti-spasticity medications  33. Blood blister to left heel: continue foam heel protectors.  34. Suboptimal vitamin D level: increase to 5,000 U daily. continue this dose  35. Hypotension: resolved, decrease propanolol to daily. decrease baclofen to 10 mg TID, continue this dose, BP reviewed and well controlled, continue current regimen  26. Foot pain: tramadol ordered prn, improved today, continue prn  27. S/p fall: CT head reviewed and is stable  28. Bladder incontinence: continue bladder program  29. Headaches: continue tylenol and tramadol prn        LOS: 119 days A FACE TO FACE EVALUATION WAS PERFORMED  Nicole [name] 03/17/2023, 2:15 PM

## 2023-03-17 NOTE — Progress Notes (Signed)
Blood pressure low this evening. Encouraged her to drink fluids at this time. Able to drink 240 oz of fluid during interaction.

## 2023-03-18 NOTE — Progress Notes (Signed)
Occupational Therapy Session Note  Patient Details  Name: Nicole [name] MRN: [id number] Date of Birth: 29-Jun-1975  {CHL IP REHAB OT TIME CALCULATIONS:[id number]}   Short Term Goals: Week 16: OT Short Term Goal 1 (Week 16): Patient will change orientation of toothbrush/ hairbrush to accomodate task with min assist after set up OT Short Term Goal 2 (Week 16): Patient will spear food x 3 once utensil in hand, then bring to mouth with only contact assistance  Skilled Therapeutic Interventions/Progress Updates:    Patient agreeable to participate in OT session. Reports *** pain level.   Patient participated in skilled OT session focusing on ***. Therapist facilitated/assessed/developed/educated/integrated/elicited *** in order to improve/facilitate/promote    Therapy Documentation Precautions:  Precautions Precautions: Fall Precaution Comments: delayed processing, Increased tone UB and LB Required Braces or Orthoses: Other Brace Other Brace: B adjustable night splints; B elbow "cosey" splints; L palm protector; R mitt Restrictions Weight Bearing Restrictions: No   Therapy/Group: Individual Therapy  Limmie Patricia, OTR/L,CBIS  Supplemental OT - [facility] and [facility] Secure Chat Preferred   03/18/2023, 5:37 PM

## 2023-03-18 NOTE — Progress Notes (Signed)
PROGRESS NOTE   Subjective/Complaints:  Pt reports no dizziness or lightheadedness-  Had tellow MEWS this Am- 80/40 -frequently on low side was 88 systolic 2 days ago- and usually 100's or less systolic- gave pt water and asked nursing to get more- push fluids today.   Has no other complaints.     ROS:    Pt denies SOB, abd pain, CP, N/V/C/D, and vision changes  . +bladder incontinence, +migraines  Objective:   No results found. No results for input(s): "WBC", "HGB", "HCT", "PLT" in the last 72 hours.     Recent Labs    03/16/23 0653  CREATININE 0.57     Intake/Output Summary (Last 24 hours) at 03/18/2023 1041 Last data filed at 03/18/2023 0801 Gross per 24 hour  Intake 1197 ml  Output --  Net 1197 ml       Pressure Injury 02/09/23 Heel Left Deep Tissue Pressure Injury - Purple or maroon localized area of discolored intact skin or blood-filled blister due to damage of underlying soft tissue from pressure and/or shear. Black/purple and boggy,  size of a quar (Active)  02/09/23 0543  Location: Heel  Location Orientation: Left  Staging: Deep Tissue Pressure Injury - Purple or maroon localized area of discolored intact skin or blood-filled blister due to damage of underlying soft tissue from pressure and/or shear.  Wound Description (Comments): Black/purple and boggy,  size of a quarter.  Foam dressing applied  Present on Admission: No    Physical Exam: Vital Signs Blood pressure 102/74, pulse 69, temperature (!) 97.5 F (36.4 C), temperature source Oral, resp. rate 20, height 5\' 1"  (1.549 m), weight 46.1 kg, SpO2 100%.     General: awake, alert, sitting up in bed- poor initiation; NAD HENT: conjugate gaze; oropharynx moist CV: regular rate and rhythm; no JVD Pulmonary: CTA B/L; no W/R/R- good air movement GI: soft, NT, ND, (+)BS Psychiatric: extremely flat Neurological: delayed responses- denies  dizziness/lightheadedness Skin: + Scaling dried skin on bilateral heels  - 2 small excoriations, sore on the base of the 5th metatarsal head -no longer present - rash on face - no longer present - stage 2 to right toe -no longer present  + area of redness between left thumb and index finger-now minimal, purple bruise left buttock unstageable - not examined +redness to bilateral elbows -not appreciated + Left heel stage III DTI, covered in foam dressing  Neurologic: AAOx2,  to year but not month or date. Follows most simple commands.  + Hypophonation, speech intelligability much improved  Prior exams: MAS 1 right elbow,MAS 1 in RIght finger and wrist flexors  MAS 3 knee flexors, and MAS 3 L>R  hip adductors  MAS 3 L elbow , MAS 3-4 in L wrist and finger flexors  + LUE WHO--- Hypersensitivity to left hand improved  Motor 0/5 strength in LUE and LLE, showing volitional movements in right lower extremity but difficulty to obtain MMT due to her pain/braces, able to do upper body dressing now! Exam stable 11/1  Twice daily for right upper extremity 1/5 finger flexion, otherwise no volitional movement of left upper extremity or bilateral lowers + BL UE WHO.  Musculoskeletal: reduced bilateral shoulder and elbow ROM due to increased tone , pain with passive knee extension bilaterally   . GU: bladder incontinence  Assessment/Plan: 1. Functional deficits which require 3+ hours per day of interdisciplinary therapy in a comprehensive inpatient rehab setting. Physiatrist is providing close team supervision and 24 hour management of active medical problems listed below. Physiatrist and rehab team continue to assess barriers to discharge/monitor patient progress toward functional and medical goals  Care Tool:  Bathing    Body parts bathed by patient: Face, Abdomen, Chest, Left upper leg, Right upper leg, Left arm, Right lower leg, Left lower leg   Body parts bathed by helper: Right arm,  Left arm, Front perineal area, Buttocks, Left lower leg, Right lower leg Body parts n/a: Right arm, Left arm, Front perineal area, Buttocks, Right upper leg, Left upper leg, Right lower leg, Left lower leg   Bathing assist Assist Level: Moderate Assistance - Patient 50 - 74%     Upper Body Dressing/Undressing Upper body dressing   What is the patient wearing?: Pull over shirt    Upper body assist Assist Level: Minimal Assistance - Patient > 75%    Lower Body Dressing/Undressing Lower body dressing      What is the patient wearing?: Pants, Incontinence brief     Lower body assist Assist for lower body dressing: Maximal Assistance - Patient 25 - 49%     Toileting Toileting Toileting Activity did not occur (Clothing management and hygiene only): N/A (no void or bm)  Toileting assist Assist for toileting: Dependent - Patient 0%     Transfers Chair/bed transfer  Transfers assist  Chair/bed transfer activity did not occur: Safety/medical concerns  Chair/bed transfer assist level: Total Assistance - Patient < 25%     Locomotion Ambulation   Ambulation assist   Ambulation activity did not occur: Safety/medical concerns  Assist level: 2 helpers   Max distance: 51ft   Walk 10 feet activity   Assist  Walk 10 feet activity did not occur: Safety/medical concerns  Assist level: 2 helpers     Walk 50 feet activity   Assist Walk 50 feet with 2 turns activity did not occur: Safety/medical concerns         Walk 150 feet activity   Assist Walk 150 feet activity did not occur: Safety/medical concerns         Walk 10 feet on uneven surface  activity   Assist Walk 10 feet on uneven surfaces activity did not occur: Safety/medical concerns         Wheelchair     Assist Is the patient using a wheelchair?: Yes Type of Wheelchair: Manual    Wheelchair assist level: Dependent - Patient 0% Max wheelchair distance: 23ft    Wheelchair 50 feet with 2  turns activity    Assist    Wheelchair 50 feet with 2 turns activity did not occur: Safety/medical concerns   Assist Level: Dependent - Patient 0%   Wheelchair 150 feet activity     Assist  Wheelchair 150 feet activity did not occur: Safety/medical concerns   Assist Level: Dependent - Patient 0%   Blood pressure 102/74, pulse 69, temperature (!) 97.5 F (36.4 C), temperature source Oral, resp. rate 20, height 5\' 1"  (1.549 m), weight 46.1 kg, SpO2 100%.  Medical Problem List and Plan: 1. 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 Code status  DNR Con't CIR- had yellow MEWS today- 80/40s- already back up to 100/60s- wrote to push fluids and d/w nursing.  2.  Impaired mobility: continue Lovenox, weekly creatinine ordered             -antiplatelet therapy: N/A  3. Knee paint: Tylenol as needed, continue voltaren gel scheduled.   XR reviewed and is stable, discussed with therapy  4. History of anxiety: discussed with her mom that she feels this was a big risk factor for her accident, propanolol ordered.   5. Neuropsych/cognition: This patient is not capable of making decisions on her own behalf  - 10/12: Still not capable of medical decision making; BUT oriented and communicating, following directions - MUCH improved .Will add PRN melatonin 3mg  10/20 for insomnia 6. Left buttock unstageable ulcer.  Continue Medihoney to buttock wound daily cover with foam dressing.  Changing dressing every 3 days  or as needed soiling   7. Fluids/Electrolytes/Nutrition: Routine in and outs with follow-up chemistries  -Eating  well with cueing  8.  Cavitary right lower lobe pneumonia likely aspiration pneumonia/MRSA pneumonia.  Resolved    Latest Ref Rng & Units 02/05/2023   11:12 AM 01/22/2023    6:36 AM 12/20/2022   10:54 AM  CBC  WBC 4.0 - 10.5 K/uL 8.5  8.1  5.6   Hemoglobin 12.0 - 15.0 g/dL 19.1  47.8  29.5   Hematocrit 36.0 - 46.0 % 46.7  47.5  42.8   Platelets 150 - 400 K/uL 284  274  253      9.  History of drug abuse.  Positive cocaine on urine drug screen.  Provide counseling  10.  AKI/hypovolemia and ATN.  Resolved Appears to have fair fluid intake, creatinine has been stable  11/3- Push fluids due ot low BP    Latest Ref Rng & Units 03/16/2023    6:53 AM 03/09/2023    6:13 AM 03/02/2023    7:24 AM  BMP  Creatinine 0.44 - 1.00 mg/dL 6.21  3.08  6.57     11.  Mild transaminitis with rhabdomyolysis.  Both resolved  12.  Hypotension. Resolved, d/c midodrine  -BP stable  11/2- BP was running 87 systolic last night- and max of 110 in last 24 hours- not dizzy per chart, but if develops any dizziness, suggest primary team restarts Midodrine. Amantadine can cause hypotension, FYI  11/3- No dizziness- BP on low side- will push fluids, but might restart Midodrine. If becomes yellow MEWS again, will restart midodrine today- d/w nursing  13. Impaired initiation: continue amantadine 100mg  BID  14. Hyperlycemia: CBGs mildly elevated, d/ced checks  15. Spasticity:   -discussed serial casting with OT and her mom  - continue baclofen to 20mg  TID and tizanidine to 4mg  TID. Continue wrist, elbow brace, and PRAFOs. Ordered a night splint for LLE  Repeat CMP today given chronic tizanidine use  -9/7 continues to have significant tone despite use of tizanidine and baclofen, may be beneficial consider baclofen pump in outpatient setting if this causes significant difficulties in care  9/8 consider  decrease baclofen if dyskinetic movements continue  10/5-6  continue orthotics, ROM, positioning  when possible 16. Bowel and bladder incontinence: continue bowel and bladder program  17. Left hand skin maceration: Eucerin ordered, discussed hand split with OT, continue  18. History of magnesium deficiency: magnesium 500mg  started HS  19. HTN: see above Increase tizanidine to 6mg  TID. Magnesium supplement added HS. Increase propanolol to 20mg  TID, continue this dose, d/c clonidine patch given hypotension    03/18/2023    6:15 AM 03/18/2023    5:30 AM 03/18/2023    4:50 AM  Vitals with BMI  Systolic 102 100 80  Diastolic 74 60 60  Pulse 69 81 81     20. Area of redness between left thumb and index finger: continue foam dressing  21. Scalp eczema: selsun blue ordered, continue  -9/7 patient's mother feels like this is causing her to itch, will start some hydrocortisone cream  22. Fatigue: improved, discussed with team may be secondary to anti-spasticity medications, B12 reviewed and is 335, in suboptimal range, will give 1,000U B12 injection 8/7, will order monthly as needed, metanx started, increase to BID, continue  23. Right wrist swelling: XR ordered and shows no acute fracture, shows 2.26mm ulnar variance, brace removed to given patient a break from it. Voltaren gel ordered prn for discomfort, continue, CT ordered and show no acute osseus injury, continue ace wrap  23. Dysphagia: continue D3/thins--tolerating this diet  24. MRSA nares positive: negative on repeat, precautions d/ced  25. Tachycardic: resolved, d/c propanolol.  continueTizanidine increased to 6mg  TID. Resolved, amantadine increased back to 100mg  BID   10/28 HR controlled in 80's  11/3- HR controlled- con't regimen 26.  Dyskinetic movements in head and neck - appears to be myoclonic jerks related to hypoxic brain injury - as discussed with pt mom, no specific meds for this on antispasticity meds , consioder benzo  although with pt's SUD would avoid this class  EEG reviewed and was negative for seizure.  Decrease baclofen to 15mg  TID  27. Small sore on the base of 5th metatarsal head: local wound care, continue  28. Rash on face: resolved, discussed with mother could have been secondary to added sugars in nutritional supplements  29. Sacral pressure injury: air mattress ordered, Placed nursing order to offload q1 hour while awake, continue medihoney, wound care consulted, discussed with nursing that this is improving  30. Cervical extensor weakness: tried decreasing tizanidine but spasticity appears increased and BP elevated so will increase back to 6mg  TID, continue this dose, continue cervical collar  31. Low protein stores: prosource ordered, continue  32. Intermittent lethargy: labs reviewed and are stable, discussed with team may be secondary to anti-spasticity medications  33. Blood blister to left heel: continue foam heel protectors.  34. Suboptimal vitamin D level: increase to 5,000 U daily. continue this dose  35. Hypotension: resolved, decrease propanolol to daily. decrease baclofen to 10 mg TID, continue this dose, BP reviewed and well controlled, continue current regimen  26. Foot pain: tramadol ordered prn, improved today, continue prn  27. S/p fall: CT head reviewed and is stable  28. Bladder incontinence: continue bladder program  29. Headaches: continue tylenol and tramadol prn    I spent a total of 36   minutes on total care today- >50% coordination of care- due to  Due to yellow MEWS- and d/w nursing about plan- push fluids- wait for Midodrine for now, but if gets low again, will just  restart Midodrine.     LOS: 120 days A FACE TO FACE EVALUATION WAS PERFORMED  Nicole [name] 03/18/2023, 10:41 AM

## 2023-03-18 NOTE — Progress Notes (Signed)
Vitals rechecked due to hypotension in the early AM hours when vitals were obtained. Vitals triggering [place] and vitals rechecked. Did report sensation of being cold when initially vitals were obtained given warm blanket. Denies any symptoms at this time. When rechecking vitals encouraged to drink fluids. Pt requesting lights off lights turned off and door to room closed. Continues to remain on visual observation with Tele-Sitter.

## 2023-03-19 NOTE — Progress Notes (Signed)
PROGRESS NOTE   Subjective/Complaints: No new complaints this morning Discussed that BP has been low/normal and medications have been decreased and she says "awesome!"  Has no other complaints.     ROS:    Pt denies SOB, abd pain, CP, N/V/C/D, and vision changes  . +bladder incontinence, +migraines  Objective:   No results found. No results for input(s): "WBC", "HGB", "HCT", "PLT" in the last 72 hours.     No results for input(s): "NA", "K", "CL", "CO2", "GLUCOSE", "BUN", "CREATININE", "CALCIUM" in the last 72 hours.    Intake/Output Summary (Last 24 hours) at 03/19/2023 1003 Last data filed at 03/19/2023 0807 Gross per 24 hour  Intake 838 ml  Output --  Net 838 ml       Pressure Injury 02/09/23 Heel Left Deep Tissue Pressure Injury - Purple or maroon localized area of discolored intact skin or blood-filled blister due to damage of underlying soft tissue from pressure and/or shear. Black/purple and boggy,  size of a quar (Active)  02/09/23 0543  Location: Heel  Location Orientation: Left  Staging: Deep Tissue Pressure Injury - Purple or maroon localized area of discolored intact skin or blood-filled blister due to damage of underlying soft tissue from pressure and/or shear.  Wound Description (Comments): Black/purple and boggy,  size of a quarter.  Foam dressing applied  Present on Admission: No    Physical Exam: Vital Signs Blood pressure 103/70, pulse 81, temperature 98.3 F (36.8 C), temperature source Oral, resp. rate 18, height 5\' 1"  (1.549 m), weight 46.1 kg, SpO2 97%.     General: awake, alert, sitting up in bed- poor initiation; NAD HENT: conjugate gaze; oropharynx moist CV: regular rate and rhythm; no JVD Pulmonary: CTA B/L; no W/R/R- good air movement GI: soft, NT, ND, (+)BS Psychiatric: extremely flat Neurological: delayed responses- denies dizziness/lightheadedness Skin: + Scaling dried  skin on bilateral heels  - 2 small excoriations, sore on the base of the 5th metatarsal head -no longer present - rash on face - no longer present - stage 2 to right toe -no longer present  + area of redness between left thumb and index finger-now minimal, purple bruise left buttock unstageable - not examined +redness to bilateral elbows -not appreciated + Left heel stage III DTI, covered in foam dressing  Neurologic: AAOx2,  to year but not month or date. Follows most simple commands.  + Hypophonation, speech intelligability much improved  Prior exams: MAS 1 right elbow,MAS 1 in RIght finger and wrist flexors  MAS 3 knee flexors, and MAS 3 L>R  hip adductors  MAS 3 L elbow , MAS 3-4 in L wrist and finger flexors  + LUE WHO--- Hypersensitivity to left hand improved  Motor 0/5 strength in LUE and LLE, showing volitional movements in right lower extremity but difficulty to obtain MMT due to her pain/braces, able to do upper body dressing now! Exam stable 11/4  Twice daily for right upper extremity 1/5 finger flexion, otherwise no volitional movement of left upper extremity or bilateral lowers + BL UE WHO.    Musculoskeletal: reduced bilateral shoulder and elbow ROM due to increased tone , pain with passive knee extension bilaterally   .  GU: bladder incontinence  Assessment/Plan: 1. Functional deficits which require 3+ hours per day of interdisciplinary therapy in a comprehensive inpatient rehab setting. Physiatrist is providing close team supervision and 24 hour management of active medical problems listed below. Physiatrist and rehab team continue to assess barriers to discharge/monitor patient progress toward functional and medical goals  Care Tool:  Bathing    Body parts bathed by patient: Face, Abdomen, Chest, Left upper leg, Right upper leg, Left arm, Right lower leg, Left lower leg   Body parts bathed by helper: Right arm, Left arm, Front perineal area, Buttocks, Left lower  leg, Right lower leg Body parts n/a: Right arm, Left arm, Front perineal area, Buttocks, Right upper leg, Left upper leg, Right lower leg, Left lower leg   Bathing assist Assist Level: Moderate Assistance - Patient 50 - 74%     Upper Body Dressing/Undressing Upper body dressing   What is the patient wearing?: Pull over shirt    Upper body assist Assist Level: Minimal Assistance - Patient > 75%    Lower Body Dressing/Undressing Lower body dressing      What is the patient wearing?: Pants, Incontinence brief     Lower body assist Assist for lower body dressing: Maximal Assistance - Patient 25 - 49%     Toileting Toileting Toileting Activity did not occur (Clothing management and hygiene only): N/A (no void or bm)  Toileting assist Assist for toileting: Dependent - Patient 0%     Transfers Chair/bed transfer  Transfers assist  Chair/bed transfer activity did not occur: Safety/medical concerns  Chair/bed transfer assist level: Total Assistance - Patient < 25%     Locomotion Ambulation   Ambulation assist   Ambulation activity did not occur: Safety/medical concerns  Assist level: 2 helpers   Max distance: 56ft   Walk 10 feet activity   Assist  Walk 10 feet activity did not occur: Safety/medical concerns  Assist level: 2 helpers     Walk 50 feet activity   Assist Walk 50 feet with 2 turns activity did not occur: Safety/medical concerns         Walk 150 feet activity   Assist Walk 150 feet activity did not occur: Safety/medical concerns         Walk 10 feet on uneven surface  activity   Assist Walk 10 feet on uneven surfaces activity did not occur: Safety/medical concerns         Wheelchair     Assist Is the patient using a wheelchair?: Yes Type of Wheelchair: Manual    Wheelchair assist level: Dependent - Patient 0% Max wheelchair distance: 56ft    Wheelchair 50 feet with 2 turns activity    Assist    Wheelchair 50  feet with 2 turns activity did not occur: Safety/medical concerns   Assist Level: Dependent - Patient 0%   Wheelchair 150 feet activity     Assist  Wheelchair 150 feet activity did not occur: Safety/medical concerns   Assist Level: Dependent - Patient 0%   Blood pressure 103/70, pulse 81, temperature 98.3 F (36.8 C), temperature source Oral, resp. rate 18, height 5\' 1"  (1.549 m), weight 46.1 kg, SpO2 97%.  Medical Problem List and Plan: 1. 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 Code status  DNR Continue CIR 2.  Impaired mobility: continue Lovenox, weekly creatinine ordered             -antiplatelet therapy: N/A  3. Knee paint: Tylenol as needed, continue voltaren gel scheduled.   XR reviewed and is stable, discussed with therapy  4. History of anxiety: discussed with her mom that she feels this was a big risk factor for her accident, propanolol ordered. Continue magnesium supplement  5. Neuropsych/cognition: This patient is not capable of making decisions on her own behalf  - 10/12: Still not capable of medical decision making; BUT oriented and communicating, following directions - MUCH improved .Will add PRN melatonin 3mg  10/20 for insomnia 6. Left buttock unstageable ulcer.  Continue Medihoney to buttock wound daily cover with foam dressing.  Changing dressing every 3 days or as needed soiling   7. Fluids/Electrolytes/Nutrition: Routine in and outs with follow-up chemistries  -Eating   well with cueing  8.  Cavitary right lower lobe pneumonia likely aspiration pneumonia/MRSA pneumonia.  Resolved    Latest Ref Rng & Units 02/05/2023   11:12 AM 01/22/2023    6:36 AM 12/20/2022   10:54 AM  CBC  WBC 4.0 - 10.5 K/uL 8.5  8.1  5.6   Hemoglobin 12.0 - 15.0 g/dL 16.1  09.6  04.5   Hematocrit 36.0 - 46.0 % 46.7  47.5  42.8   Platelets 150 - 400 K/uL 284  274  253      9.  History of drug abuse.  Positive cocaine on urine drug screen.  Provide counseling  10.  AKI/hypovolemia and ATN.  Resolved Appears to have fair fluid intake, creatinine has been stable  11/3- Push fluids due ot low BP    Latest Ref Rng & Units 03/16/2023    6:53 AM 03/09/2023    6:13 AM 03/02/2023    7:24 AM  BMP  Creatinine 0.44 - 1.00 mg/dL 4.09  8.11  9.14     11.  Mild transaminitis with rhabdomyolysis.  Both resolved  12.  Hypotension. Resolved, d/c midodrine  -BP stable  11/2- BP was running 87 systolic last night- and max of 110 in last 24 hours- not dizzy per chart, but if develops any dizziness, suggest primary team restarts Midodrine. Amantadine can cause hypotension, FYI  11/3- No dizziness- BP on low side- will push fluids, but might restart Midodrine. If becomes yellow MEWS again, will restart midodrine today- d/w nursing  13. Impaired initiation: continue amantadine 100mg  BID  14. Hyperlycemia: CBGs mildly elevated, d/ced checks  15. Spasticity:   -discussed serial casting with OT and her mom  - continue baclofen to 20mg  TID and tizanidine to 4mg  TID. Continue wrist, elbow brace, and PRAFOs. Ordered a night splint for LLE  Repeat CMP today given chronic tizanidine use  -9/7 continues to have significant tone despite use of tizanidine and baclofen, may be beneficial consider baclofen pump in outpatient setting if this causes significant difficulties in care  9/8 consider decrease baclofen if dyskinetic movements continue  10/5-6  continue orthotics, ROM, positioning  when possible 16.  Bowel and bladder incontinence: continue bowel and bladder program  17. Left hand skin maceration: Eucerin ordered, discussed  hand split with OT, continue  18. History of magnesium deficiency: magnesium 500mg  started HS  19. HTN: see above Increase tizanidine to 6mg  TID. Magnesium supplement added HS. Increase propanolol to 20mg  TID, continue this dose, d/c clonidine patch given hypotension    03/19/2023    4:49 AM 03/18/2023   10:25 PM 03/18/2023    6:15 PM  Vitals with BMI  Systolic 103 91 90  Diastolic 70 63 58  Pulse 81 81 86     20. Area of redness between left thumb and index finger: continue foam dressing  21. Scalp eczema: selsun blue ordered, continue  -9/7 patient's mother feels like this is causing her to itch, will start some hydrocortisone cream  22. Fatigue: improved, discussed with team may be secondary to anti-spasticity medications, B12 reviewed and is 335, in suboptimal range, will give 1,000U B12 injection 8/7, will order monthly as needed, metanx started, increase to BID, continue  23. Right wrist swelling: XR ordered and shows no acute fracture, shows 2.84mm ulnar variance, brace removed to given patient a break from it. Voltaren gel ordered prn for discomfort, continue, CT ordered and show no acute osseus injury, continue ace wrap  23. Dysphagia: continue D3/thins--tolerating this diet  24. MRSA nares positive: negative on repeat, precautions d/ced  25. Tachycardic: resolved, d/c propanolol.  continueTizanidine increased to 6mg  TID. Resolved, amantadine increased back to 100mg  BID   10/28 HR controlled in 80's  11/3- HR controlled- con't regimen 26.  Dyskinetic movements in head and neck - appears to be myoclonic jerks related to hypoxic brain injury - as discussed with pt mom, no specific meds for this on antispasticity meds , consioder benzo although with pt's SUD would avoid this class  EEG reviewed and was negative for seizure.  Decrease baclofen to 15mg   TID  27. Small sore on the base of 5th metatarsal head: local wound care, continue  28. Rash on face: resolved, discussed with mother could have been secondary to added sugars in nutritional supplements  29. Sacral pressure injury: air mattress ordered, Placed nursing order to offload q1 hour while awake, continue medihoney, wound care consulted, discussed with nursing that this is improving  30. Cervical extensor weakness: tried decreasing tizanidine but spasticity appears increased and BP elevated so will increase back to 6mg  TID, continue this dose, continue cervical collar  31. Low protein stores: prosource ordered, continue  32. Intermittent lethargy: labs reviewed and are stable, discussed with team may be secondary to anti-spasticity medications  33. Blood blister to left heel: continue foam heel protectors.  34. Suboptimal vitamin D level: increase to 5,000 U daily. continue this dose  35. Hypotension: resolved, decrease propanolol to daily. decrease baclofen to 10 mg TID, continue this dose, BP reviewed and well controlled, continue current regimen  26. Foot pain: tramadol ordered prn, improved today, continue prn  27. S/p fall: CT head reviewed and is stable  28. Bladder incontinence: continue bladder program  29. Headaches: continue tylenol and tramadol prn    LOS: 121 days A FACE TO FACE EVALUATION WAS PERFORMED  Azaiah Mello P Raevon Broom 03/19/2023, 10:03 AM

## 2023-03-19 NOTE — Progress Notes (Signed)
Physical Therapy Session Note  Patient Details  Name: Nicole [name] MRN: [id number] Date of Birth: [birth year]  Today's Date: 03/19/2023 PT Individual Time: 4010-2725 PT Individual Time Calculation (min): 45 min   Short Term Goals: Week 3:  PT Short Term Goal 1 (Week 3): Pt will initiate bed mobility but require no more than totalA PT Short Term Goal 1 - Progress (Week 3): Not met PT Short Term Goal 2 (Week 3): Pt will maintain sitting balance unsupported for 30 seconds PT Short Term Goal 2 - Progress (Week 3): Not met PT Short Term Goal 3 (Week 3): Pt will demonstrate command follow for ~15% of tasks. PT Short Term Goal 3 - Progress (Week 3): Not met  Skilled Therapeutic Interventions/Progress Updates:      Pt in bed, finishing up her breakfast. LPN present for morning medications.   Pt reports having "somewhat" of a dry brief - when asked, does report need to toilet but does require questioning for this.   Supine<>sitting EOB with modA with improved initiation. Pt able to take her medications while sitting unsupported at EOB with CGA. Improved forward lean and less posterior LOB compared to earlier sessions.   Squat pivot with totalA into w/c and then wheeled into bathroom. Assisted onto toilet in similar manner - totalA for squat pivot and dependent assist for managing brief. Pt continent of large BM once on toilet - returned to her w/c and pericare was completed by completing several stands using grab bar in bathroom with maxA while PT completed posterior pericare with dependent assist. New clean brief donned, LB dressing with totalA and UB dressing with maxA. Of note, she did have x1 large LOB while sitting in the wheelchair while getting dressed that required totalA for recovery to prevent a fall forwards out of her chair.   Wheeled sinkside and she completed oral care with setupA at w/c level.   Pt wanting to call her mother - unable to recall the last 4 digits of her phone #. Dialed  the phone for her and she was able to speak to her mother via housephone.   Seat belt alarm on, all needs met at end of treatment.   Therapy Documentation Precautions:  Precautions Precautions: Fall Precaution Comments: delayed processing, Increased tone UB and LB Required Braces or Orthoses: Other Brace Other Brace: B adjustable night splints; B elbow "cosey" splints; L palm protector; R mitt Restrictions Weight Bearing Restrictions: No General:      Therapy/Group: Individual Therapy  Orrin Brigham 03/19/2023, 7:49 AM

## 2023-03-19 NOTE — Progress Notes (Addendum)
Patient ID: Nicole [name], female   DOB: July 10, 1975, 47 y.o.   MRN: [id number]  Have reached out to [facility] rehab, [facility] [employer], [employer], Catering manager awaiting return call from [place] but others will look at case and let worker know if could offer a bed. Mom aware needing to look for a bed offer from any facility. Placed on waiting list for [facility] long term bed.   2:17 PM Spoke with [facility] and she took the information and will get back with this worker regarding if can offer a bed.

## 2023-03-19 NOTE — Progress Notes (Signed)
Speech Language Pathology Daily Session Note  Patient Details  Name: Nicole [name] MRN: [id number] Date of Birth: 23-Jun-1975  Today's Date: 03/19/2023 SLP Individual Time: 1330-1410 SLP Individual Time Calculation (min): 40 min  Short Term Goals: Week 17: SLP Short Term Goal 1 (Week 17): Week 17 - Pt will perform verbal problem solving tasks with overall Min A verbal and visual cues to verbalize solutions. SLP Short Term Goal 2 (Week 17): Week 17-Patient will utilize external aids for orientation to place and time with supervision level verbal cues SLP Short Term Goal 3 (Week 17): Week 17 - Pt will demonstrate sustained attention to basic tasks for 12 minutes with Mod A multimodal cues for redirection. SLP Short Term Goal 4 (Week 17): Week 17 - Pt will utilize increased vocal intensity w/ MinA verbal cues to remain 90% intelligibile at phrase level  Skilled Therapeutic Interventions: Skilled treatment session focused on cognitive goals. Due to patient's improvements in cognitive and communication, SLP administered a cognitive-linguistic assessment.  SLP administered the COGNISTAT and patient scored WFL on all subtests the exception of severe deficits with visual construction tasks, moderate deficits in orientation, and mild deficits in calculations. SLP also provided patient with a new monthly calendar that she utilized with overall Mod verbal and visual cues for recall of the date.  Patient left upright in the bed with alarm on and all needs within reach. Continue with current plan of care.    Pain Pain Assessment Pain Scale: 0-10 Pain Score: 8  Pain Type: Acute pain Pain Location: Buttocks Pain Descriptors / Indicators: Constant;Sharp Pain Frequency: Constant Pain Onset: On-going Patients Stated Pain Goal: 5  Therapy/Group: Individual Therapy  Dniyah Grant 03/19/2023, 3:03 PM

## 2023-03-19 NOTE — Plan of Care (Signed)
  Problem: Consults Goal: RH STROKE PATIENT EDUCATION Description: See Patient Education module for education specifics  Outcome: Progressing   Problem: RH BOWEL ELIMINATION Goal: RH STG MANAGE BOWEL WITH ASSISTANCE Description: STG Manage Bowel with mod I Assistance. Outcome: Progressing Goal: RH STG MANAGE BOWEL W/MEDICATION W/ASSISTANCE Description: STG Manage Bowel with Medication with mod I Assistance. Outcome: Progressing   Problem: RH BLADDER ELIMINATION Goal: RH STG MANAGE BLADDER WITH ASSISTANCE Description: STG Manage Bladder With toileting Assistance Outcome: Progressing   Problem: RH SKIN INTEGRITY Goal: RH STG MAINTAIN SKIN INTEGRITY WITH ASSISTANCE Description: STG Maintain Skin Integrity With min  Assistance. Outcome: Progressing

## 2023-03-20 MED ORDER — BACLOFEN 10 MG PO TABS
10.0000 mg | ORAL_TABLET | Freq: Two times a day (BID) | ORAL | Status: DC
  Administered 2023-03-20 – 2023-03-21 (×2): 10 mg via ORAL
  Filled 2023-03-20 (×2): qty 1

## 2023-03-20 NOTE — Progress Notes (Signed)
PROGRESS NOTE   Subjective/Complaints: Patient states she does not like prosource, will d/c as she is eating 100% of meals, she is appreciative Hypotensive yesterday evening  Has no other complaints.     ROS:    Pt denies SOB, abd pain, CP, N/V/C/D, and vision changes  . +bladder incontinence, +migraines  Objective:   No results found. No results for input(s): "WBC", "HGB", "HCT", "PLT" in the last 72 hours.     No results for input(s): "NA", "K", "CL", "CO2", "GLUCOSE", "BUN", "CREATININE", "CALCIUM" in the last 72 hours.    Intake/Output Summary (Last 24 hours) at 03/20/2023 0935 Last data filed at 03/19/2023 1342 Gross per 24 hour  Intake 118 ml  Output --  Net 118 ml       Pressure Injury 02/09/23 Heel Left Deep Tissue Pressure Injury - Purple or maroon localized area of discolored intact skin or blood-filled blister due to damage of underlying soft tissue from pressure and/or shear. Black/purple and boggy,  size of a quar (Active)  02/09/23 0543  Location: Heel  Location Orientation: Left  Staging: Deep Tissue Pressure Injury - Purple or maroon localized area of discolored intact skin or blood-filled blister due to damage of underlying soft tissue from pressure and/or shear.  Wound Description (Comments): Black/purple and boggy,  size of a quarter.  Foam dressing applied  Present on Admission: No    Physical Exam: Vital Signs Blood pressure 99/73, pulse 71, temperature 98.1 F (36.7 C), resp. rate 16, height 5\' 1"  (1.549 m), weight 46.1 kg, SpO2 100%.     General: awake, alert, sitting up in bed- poor initiation; NAD HENT: conjugate gaze; oropharynx moist CV: regular rate and rhythm; no JVD Pulmonary: CTA B/L; no W/R/R- good air movement GI: soft, NT, ND, (+)BS Psychiatric: extremely flat Neurological: delayed responses- denies dizziness/lightheadedness Skin: + Scaling dried skin on bilateral  heels  - 2 small excoriations, sore on the base of the 5th metatarsal head -no longer present - rash on face - no longer present - stage 2 to right toe -no longer present  + area of redness between left thumb and index finger-now minimal, purple bruise left buttock unstageable - not examined +redness to bilateral elbows -not appreciated + Left heel stage III DTI, covered in foam dressing  Neurologic: AAOx2,  to year but not month or date. Follows most simple commands.  + Hypophonation, speech intelligability much improved  Prior exams: MAS 1 right elbow,MAS 1 in RIght finger and wrist flexors  MAS 3 knee flexors, and MAS 3 L>R  hip adductors  MAS 3 L elbow , MAS 3-4 in L wrist and finger flexors  + LUE WHO--- Hypersensitivity to left hand improved  Motor 0/5 strength in LUE and LLE, showing volitional movements in right lower extremity but difficulty to obtain MMT due to her pain/braces, able to do upper body dressing now! Exam stable 11/5  Twice daily for right upper extremity 1/5 finger flexion, otherwise no volitional movement of left upper extremity or bilateral lowers + BL UE WHO.    Musculoskeletal: reduced bilateral shoulder and elbow ROM due to increased tone , pain with passive knee extension bilaterally   .  GU: bladder incontinence  Assessment/Plan: 1. Functional deficits which require 3+ hours per day of interdisciplinary therapy in a comprehensive inpatient rehab setting. Physiatrist is providing close team supervision and 24 hour management of active medical problems listed below. Physiatrist and rehab team continue to assess barriers to discharge/monitor patient progress toward functional and medical goals  Care Tool:  Bathing    Body parts bathed by patient: Face, Abdomen, Chest, Left upper leg, Right upper leg, Left arm, Right lower leg, Left lower leg   Body parts bathed by helper: Right arm, Left arm, Front perineal area, Buttocks, Left lower leg, Right lower  leg Body parts n/a: Right arm, Left arm, Front perineal area, Buttocks, Right upper leg, Left upper leg, Right lower leg, Left lower leg   Bathing assist Assist Level: Moderate Assistance - Patient 50 - 74%     Upper Body Dressing/Undressing Upper body dressing   What is the patient wearing?: Pull over shirt    Upper body assist Assist Level: Minimal Assistance - Patient > 75%    Lower Body Dressing/Undressing Lower body dressing      What is the patient wearing?: Pants, Incontinence brief     Lower body assist Assist for lower body dressing: Maximal Assistance - Patient 25 - 49%     Toileting Toileting Toileting Activity did not occur (Clothing management and hygiene only): N/A (no void or bm)  Toileting assist Assist for toileting: Dependent - Patient 0%     Transfers Chair/bed transfer  Transfers assist  Chair/bed transfer activity did not occur: Safety/medical concerns  Chair/bed transfer assist level: Total Assistance - Patient < 25%     Locomotion Ambulation   Ambulation assist   Ambulation activity did not occur: Safety/medical concerns  Assist level: 2 helpers   Max distance: 38ft   Walk 10 feet activity   Assist  Walk 10 feet activity did not occur: Safety/medical concerns  Assist level: 2 helpers     Walk 50 feet activity   Assist Walk 50 feet with 2 turns activity did not occur: Safety/medical concerns         Walk 150 feet activity   Assist Walk 150 feet activity did not occur: Safety/medical concerns         Walk 10 feet on uneven surface  activity   Assist Walk 10 feet on uneven surfaces activity did not occur: Safety/medical concerns         Wheelchair     Assist Is the patient using a wheelchair?: Yes Type of Wheelchair: Manual    Wheelchair assist level: Dependent - Patient 0% Max wheelchair distance: 11ft    Wheelchair 50 feet with 2 turns activity    Assist    Wheelchair 50 feet with 2 turns  activity did not occur: Safety/medical concerns   Assist Level: Dependent - Patient 0%   Wheelchair 150 feet activity     Assist  Wheelchair 150 feet activity did not occur: Safety/medical concerns   Assist Level: Dependent - Patient 0%   Blood pressure 99/73, pulse 71, temperature 98.1 F (36.7 C), resp. rate 16, height 5\' 1"  (1.549 m), weight 46.1 kg, SpO2 100%.  Medical Problem List and Plan: 1. 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 Code status  DNR Continue CIR 2.  Impaired mobility: continue Lovenox, weekly creatinine ordered             -antiplatelet therapy: N/A  3. Knee paint: Tylenol as needed, continue voltaren gel scheduled.   XR reviewed and is stable, discussed with therapy  4. History of anxiety: discussed with her mom that she feels this was a big risk factor for her accident, propanolol ordered. Continue magnesium supplement  5. Neuropsych/cognition: This patient is not capable of making decisions on her own behalf  - 10/12: Still not capable of medical decision making; BUT oriented and communicating, following directions - MUCH improved .Will add PRN melatonin 3mg  10/20 for insomnia 6. Left buttock unstageable ulcer.  Continue Medihoney to buttock wound daily cover with foam dressing.  Changing dressing every 3 days or as needed soiling   7. Fluids/Electrolytes/Nutrition: Routine in and outs with follow-up chemistries  -Eating  well with cueing  8.  Cavitary right lower  lobe pneumonia likely aspiration pneumonia/MRSA pneumonia.  Resolved    Latest Ref Rng & Units 02/05/2023   11:12 AM 01/22/2023    6:36 AM 12/20/2022   10:54 AM  CBC  WBC 4.0 - 10.5 K/uL 8.5  8.1  5.6   Hemoglobin 12.0 - 15.0 g/dL 38.7  56.4  33.2   Hematocrit 36.0 - 46.0 % 46.7  47.5  42.8   Platelets 150 - 400 K/uL 284  274  253      9.  History of drug abuse.  Positive cocaine on urine drug screen.  Provide counseling  10.  AKI/hypovolemia and ATN.  Resolved Appears to have fair fluid intake, creatinine has been stable  11/3- Push fluids due ot low BP    Latest Ref Rng & Units 03/16/2023    6:53 AM 03/09/2023    6:13 AM 03/02/2023    7:24 AM  BMP  Creatinine 0.44 - 1.00 mg/dL 9.51  8.84  1.66     11.  Mild transaminitis with rhabdomyolysis.  Both resolved  12.  Hypotension. Resolved, d/c midodrine  -BP stable  11/2- BP was running 87 systolic last night- and max of 110 in last 24 hours- not dizzy per chart, but if develops any dizziness, suggest primary team restarts Midodrine. Amantadine can cause hypotension, FYI  11/3- No dizziness- BP on low side- will push fluids, but might restart Midodrine. If becomes yellow MEWS again, will restart midodrine today- d/w nursing  13. Impaired initiation: continue amantadine 100mg  BID  14. Hyperlycemia: CBGs mildly elevated, d/ced checks  15. Spasticity:   -discussed serial casting with OT and her mom  - continue baclofen to 20mg  TID and tizanidine to 4mg  TID. Continue wrist, elbow brace, and PRAFOs. Ordered a night splint for LLE  Repeat CMP today given chronic tizanidine use  -9/7 continues to have significant tone despite use of tizanidine and baclofen, may be beneficial consider baclofen pump in outpatient setting if this causes significant difficulties in care  9/8 consider decrease baclofen if dyskinetic movements continue  10/5-6  continue orthotics, ROM, positioning  when possible 16. Bowel and bladder incontinence: continue  bowel and bladder program  17. Left hand skin maceration: Eucerin ordered, discussed hand split with  OT, continue  18. History of magnesium deficiency: magnesium 500mg  started HS  19. HTN: see above Increase tizanidine to 6mg  TID. Magnesium supplement added HS. Increase propanolol to 20mg  TID, continue this dose, d/c clonidine patch given hypotension    03/20/2023    3:40 AM 03/19/2023    7:53 PM 03/19/2023    1:35 PM  Vitals with BMI  Systolic 99 99 100  Diastolic 73 57 71  Pulse 71 74 84     20. Area of redness between left thumb and index finger: continue foam dressing  21. Scalp eczema: selsun blue ordered, continue  -9/7 patient's mother feels like this is causing her to itch, will start some hydrocortisone cream  22. Fatigue: improved, discussed with team may be secondary to anti-spasticity medications, B12 reviewed and is 335, in suboptimal range, will give 1,000U B12 injection 8/7, will order monthly as needed, metanx started, increase to BID, continue  23. Right wrist swelling: XR ordered and shows no acute fracture, shows 2.95mm ulnar variance, brace removed to given patient a break from it. Voltaren gel ordered prn for discomfort, continue, CT ordered and show no acute osseus injury, continue ace wrap  23. Dysphagia: continue D3/thins--tolerating this diet  24. MRSA nares positive: negative on repeat, precautions d/ced  25. Tachycardic: resolved, d/c propanolol.  continueTizanidine increased to 6mg  TID. Resolved, amantadine increased back to 100mg  BID   10/28 HR controlled in 80's  11/3- HR controlled- con't regimen 26.  Dyskinetic movements in head and neck - appears to be myoclonic jerks related to hypoxic brain injury - as discussed with pt mom, no specific meds for this on antispasticity meds , consioder benzo although with pt's SUD would avoid this class  EEG reviewed and was negative for seizure.  Decrease baclofen to 15mg  TID  27. Small sore on the base of 5th  metatarsal head: local wound care, continue  28. Rash on face: resolved, discussed with mother could have been secondary to added sugars in nutritional supplements  29. Sacral pressure injury: air mattress ordered, Placed nursing order to offload q1 hour while awake, continue medihoney, wound care consulted, discussed with nursing that this is improving  30. Cervical extensor weakness: tried decreasing tizanidine but spasticity appears increased and BP elevated so will increase back to 6mg  TID, continue this dose, continue cervical collar  31. Low protein stores: d/c prosource since patient hates these and is eating 100% of meals  32. Intermittent lethargy: labs reviewed and are stable, discussed with team may be secondary to anti-spasticity medications  33. Blood blister to left heel: continue foam heel protectors.  34. Suboptimal vitamin D level: increase to 5,000 U daily. continue this dose  35. Hypotension: d/c propanolol, decrease baclofen to 10 mg BID, continue this dose, BP reviewed and well controlled, continue current regimen  26. Foot pain: tramadol ordered prn, improved today, continue prn  27. S/p fall: CT head reviewed and is stable  28. Bladder incontinence: conitnue bladder program  29. Headaches: continue tylenol and tramadol prn    LOS: 122 days A FACE TO FACE EVALUATION WAS PERFORMED  Clint Bolder P Tamotsu Wiederholt 03/20/2023, 9:35 AM

## 2023-03-20 NOTE — Progress Notes (Signed)
Occupational Therapy Session Note  Patient Details  Name: Nicole [name] MRN: [id number] Date of Birth: 1975/06/14  Today's Date: 03/20/2023 OT Individual Time: 7425-9563 OT Individual Time Calculation (min): 26 min    Short Term Goals: Week 16: OT Short Term Goal 1 (Week 16): Patient will change orientation of toothbrush/ hairbrush to accomodate task with min assist after set up OT Short Term Goal 2 (Week 16): Patient will spear food x 3 once utensil in hand, then bring to mouth with only contact assistance  Skilled Therapeutic Interventions/Progress Updates:  Pt greeted supine in bed, pt agreeable to OT intervention.      Transfers/bed mobility: pt completed supine>sit with light MINA to R side of bed with pt needing initial assist to maneuver BLEs but then able to complete task. Attempted to work on sit>stands from EOB with stedy however pt preferring to place RLE on top of knee pads on stedy and unable to redirect pt to correct foot placement, therefore deferred standing tasks d/t safety as pt also noted to be incontinent of urine.   ADLs:  LB dressing: donned new pants from bed level with total A however pt was able to bridge to assist with pulling pants to waist line.  Bathing: total A for LBbathing from bed level d/t incontinent urine void.  Toileting: total A for anterior/posterior pericare d/t incontinent urine void.   Therapeutic activity: pt completed below bimanual tasks to facilitate improved LUE coordination and motor planning:  - pt able to fold wash cloths from semi reclined position in bed, pt using LUE as more of gross assist but completed task with supervision -worked on LUE digit opposition with pt using first digit to manipulate pop it toy  -pt utilized LUE to remove squigz from side table to promote improved LUE FMC, pt completed task with supervision but did need squigz to be lightly placed on table as pt reported pain in LUE when pulling squigz off    Ended session  with pt supine in bed with all needs within reach and bed alarm activated.                   Therapy Documentation Precautions:  Precautions Precautions: Fall Precaution Comments: delayed processing, Increased tone UB and LB Required Braces or Orthoses: Other Brace Other Brace: B adjustable night splints; B elbow "cosey" splints; L palm protector; R mitt Restrictions Weight Bearing Restrictions: No  Pain: unrated pain reported in L shoulder, rest breaks, repositioning and accommodation to task provided    Therapy/Group: Individual Therapy  [name] [facility] 03/20/2023, 3:08 PM

## 2023-03-20 NOTE — Progress Notes (Signed)
Physical Therapy Session Note  Patient Details  Name: Nicole [name] MRN: [id number] Date of Birth: Dec 07, 1975  Today's Date: 03/20/2023 PT Individual Time: 1130-1202 PT Individual Time Calculation (min): 32 min   Short Term Goals: Week 3:  PT Short Term Goal 1 (Week 3): Pt will initiate bed mobility but require no more than totalA PT Short Term Goal 1 - Progress (Week 3): Not met PT Short Term Goal 2 (Week 3): Pt will maintain sitting balance unsupported for 30 seconds PT Short Term Goal 2 - Progress (Week 3): Not met PT Short Term Goal 3 (Week 3): Pt will demonstrate command follow for ~15% of tasks. PT Short Term Goal 3 - Progress (Week 3): Not met  Skilled Therapeutic Interventions/Progress Updates:      Pt lying in bed, asking for a pencil to write something down - this was provided for her and she wrote down "[email or web address]" on a piece of paper. Patient asking if she can have her phone to access instragram but no cell phone in room.   Pt noted to be incontinent of urine with heavily saturated brief. She was able to bridge (!!) to clear her hips and assist with brief change.  Pt completes supine<>sitting EOB with minA (!!!!) going towards her R side - assist only for bringing her trunk to fully upright. She requires maxA for forward scooting to EOB to get feet flat and minA for sitting balance due to posterior leaning. Donned pants with totalA and +2 assist for pulling pant over hips in standing. MaxA to partially stand with patient pushing into extension.   Squat pivot transfer with maxA into w/c and then transported to day room rehab gym.  Pt reports she wants to be able to push herself in the wheelchair so worked on w/c mobility for remainder of session. Tried to use BUE to propel but patient had difficulty with grasp and grip, tendency to use her palms to push herself. Pt requires modA for propelling ~55ft with her hands. Tried using single leg foot propulsion  (R leg) which she was  able propel ~3ft with supervision (!!). Difficulty with straight path and purposeful turns.   Pt returned to her room and patient left sitting upright in w/c with seat belt alarm on, call bell within reach, and all needs met.    Therapy Documentation Precautions:  Precautions Precautions: Fall Precaution Comments: delayed processing, Increased tone UB and LB Required Braces or Orthoses: Other Brace Other Brace: B adjustable night splints; B elbow "cosey" splints; L palm protector; R mitt Restrictions Weight Bearing Restrictions: No General:      Therapy/Group: Individual Therapy  Orrin Brigham 03/20/2023, 7:43 AM

## 2023-03-21 MED ORDER — BACLOFEN 10 MG PO TABS
10.0000 mg | ORAL_TABLET | Freq: Every day | ORAL | Status: DC
  Administered 2023-03-22 – 2023-03-27 (×6): 10 mg via ORAL
  Filled 2023-03-21 (×6): qty 1

## 2023-03-21 NOTE — Patient Care Conference (Signed)
Inpatient RehabilitationTeam Conference and Plan of Care Update Date: 03/21/2023   Time: 11:03 AM    Patient Name: Nicole [name]      Medical Record Number: [id number]  Date of Birth: 04/12/1976 Sex: Female         Room/Bed: 4W09C/4W09C-01 Payor Info: Payor: [employer] / Plan: [employer] / Product Type: *No Product type* /    Admit Date/Time:  11/18/2022  3:15 PM  Primary Diagnosis:  Hypoxic brain injury [facility])  Hospital Problems: Principal Problem:   Hypoxic brain injury (HCC) Active Problems:   Protein-calorie malnutrition, severe   Essential hypertension   Pressure injury of skin    Expected Discharge Date: Expected Discharge Date:  (SNF pending)  Team Members Present: Physician leading conference: Dr. Sula Soda Social Worker Present: Dossie Der, LCSW Nurse Present: Chana Bode, RN PT Present: Casimiro Needle, PT OT Present: Roney Mans, OT SLP Present: [name], SLP PPS Coordinator present : Fae Pippin, SLP     Current Status/Progress Goal Weekly Team Focus  Bowel/Bladder      Incontinent of bowel and bladder    Continent of bowel and bladder; managed with toileting    Timed toileting, prompting  Swallow/Nutrition/ Hydration               ADL's   mod A for UB ADL; max A for LB, toileting max A if can be contient, min A for grooming and self feeding, transfers with mod to max; able to pull herself into a squat position with min  a- difficulty with tolerating knee extension; ankle cords still very tight   max A   postural control , transfers, increasing therapy to 15/7, safety precautions as she is more mobile, consistant orientation, functional mobility    Mobility   She had her best day Tuesday 11/5 (minA bed mobility, maxA squat pivot transfers, modA w/c propulsion with her hands and supervision w/c propulsion using her R leg only) !!!!!!!!!!  Spoke with [employer] and confirmed inability to get her a custom wheelchair  even though she would greatly benefit for skin protection, safety, and propulsion. Typically she requires mod/maxA bed mobility, totalA squat pivot transfers, still non ambulatory but patient voicing she wants to work on wheelchair mobility (totalA). Continues to show improvement in cognition and ability to express needs/wants.   TotA overall  Sitting balance, functional transfers, w/c mobility, postural control - change POC to 15/7 due to functional improvements    Communication   decreased vocal intensity, requires min-mod verbal cues to increase at the phrase/sentence level - some instances of self-monitoring/self-correcting   mod A at the sentence/conversation level   steady progress toward improving speech intelligibility    Safety/Cognition/ Behavioral Observations  improving sustained attention, orientation, and basic problem solving   modA   sustained attention, orientation, and basic problem solving    Pain      Pain in left knee     Pain < 4 with prns    Assess effectiveness and need for prns  Skin      Left foot blister   Blister healing    Assess skin and assure repositioning, off loading    Discharge Planning:  Continue to search for NH bed, have reached out to liaisons to ask to consider pt. Barriers continue to be age, drug history care required   Team Discussion: Patient off pro source; eating well. Continue to note fidgeting and limited by left knee pain and left foot blister with  heel cord tightness.  Patient is voicing better and expressing needs and wants.  Patient on target to meet rehab goals: Yes; but still needs max assist for ADLs; initiation improved and engaging in tasks.  Completes squat pivot transfers with max assist.  *See Care Plan and progress notes for long and short-term goals.   Revisions to Treatment Plan:  Changed to 15/7 therapy schedule   Teaching Needs: Safety, medications, transfers, toileting, skin care, etc.   Current Barriers  to Discharge: Home enviroment access/layout, Incontinence, and Lack of/limited family support  Possible Resolutions to Barriers: SNF pending     Medical Summary Current Status: spasticity, left knee pain, hypotension, blister on heel, sacral pressure injury, foot pain, knee pain  Barriers to Discharge: Medical stability  Barriers to Discharge Comments: spasticity, left knee pain, hypotension, blister on heel, sacral pressure injury, impaired initation, headache, foot pain, knee pain Possible Resolutions to [employer] Focus: conitnue baclofen and tizanidine, decreased baclofen to HS, discussed series casting, continue amandtadine, increase to 15/7, continue tramadol/tylenol   Continued Need for Acute Rehabilitation Level of Care: The patient requires daily medical management by a physician with specialized training in physical medicine and rehabilitation for the following reasons: Direction of a multidisciplinary physical rehabilitation program to maximize functional independence : Yes Medical management of patient stability for increased activity during participation in an intensive rehabilitation regime.: Yes Analysis of laboratory values and/or radiology reports with any subsequent need for medication adjustment and/or medical intervention. : Yes   I attest that I was present, lead the team conference, and concur with the assessment and plan of the team.   Chana Bode B 03/22/2023, 1:23 PM

## 2023-03-21 NOTE — Progress Notes (Addendum)
PROGRESS NOTE   Subjective/Complaints: MEWs 1 for hypotension: spasticity has been stable, will decrease baclofen to HS She has no new complaints this morning Tolerated therapy well yesterday  Has no other complaints.     ROS:    Pt denies SOB, abd pain, CP, N/V/C/D, and vision changes  . +bladder incontinence, +migraines, +sapsticity  Objective:   No results found. No results for input(s): "WBC", "HGB", "HCT", "PLT" in the last 72 hours.     No results for input(s): "NA", "K", "CL", "CO2", "GLUCOSE", "BUN", "CREATININE", "CALCIUM" in the last 72 hours.    Intake/Output Summary (Last 24 hours) at 03/21/2023 1032 Last data filed at 03/20/2023 1755 Gross per 24 hour  Intake 240 ml  Output --  Net 240 ml       Pressure Injury 02/09/23 Heel Left Deep Tissue Pressure Injury - Purple or maroon localized area of discolored intact skin or blood-filled blister due to damage of underlying soft tissue from pressure and/or shear. Black/purple and boggy,  size of a quar (Active)  02/09/23 0543  Location: Heel  Location Orientation: Left  Staging: Deep Tissue Pressure Injury - Purple or maroon localized area of discolored intact skin or blood-filled blister due to damage of underlying soft tissue from pressure and/or shear.  Wound Description (Comments): Black/purple and boggy,  size of a quarter.  Foam dressing applied  Present on Admission: No    Physical Exam: Vital Signs Blood pressure 100/71, pulse 75, temperature 98.2 F (36.8 C), temperature source Oral, resp. rate 18, height 5\' 1"  (1.549 m), weight 46.1 kg, SpO2 100%.     General: awake, alert, sitting up in bed- poor initiation; NAD HENT: conjugate gaze; oropharynx moist CV: regular rate and rhythm; no JVD Pulmonary: CTA B/L; no W/R/R- good air movement GI: soft, NT, ND, (+)BS Psychiatric: extremely flat Neurological: delayed responses- denies  dizziness/lightheadedness Skin: + Scaling dried skin on bilateral heels  - 2 small excoriations, sore on the base of the 5th metatarsal head -no longer present - rash on face - no longer present - stage 2 to right toe -no longer present  + area of redness between left thumb and index finger-now minimal, purple bruise left buttock unstageable - not examined +redness to bilateral elbows -not appreciated + Left heel stage III DTI, covered in foam dressing  Neurologic: AAOx2,  to year but not month or date. Follows most simple commands.  + Hypophonation, speech intelligability much improved  Prior exams: MAS 1 right elbow,MAS 1 in RIght finger and wrist flexors  MAS 3 knee flexors, and MAS 3 L>R  hip adductors  MAS 3 L elbow , MAS 3-4 in L wrist and finger flexors  + LUE WHO--- Hypersensitivity to left hand improved, +left hand contracture  Motor 0/5 strength in LUE and LLE, showing volitional movements in right lower extremity but difficulty to obtain MMT due to her pain/braces, able to do upper body dressing now! Exam stable 11/6  Twice daily for right upper extremity 1/5 finger flexion, otherwise no volitional movement of left upper extremity or bilateral lowers + BL UE WHO.    Musculoskeletal: reduced bilateral shoulder and elbow ROM due to increased  tone , pain with passive knee extension bilaterally   . GU: bladder incontinence  Assessment/Plan: 1. Functional deficits which require 3+ hours per day of interdisciplinary therapy in a comprehensive inpatient rehab setting. Physiatrist is providing close team supervision and 24 hour management of active medical problems listed below. Physiatrist and rehab team continue to assess barriers to discharge/monitor patient progress toward functional and medical goals  Care Tool:  Bathing    Body parts bathed by patient: Face, Abdomen, Chest, Left upper leg, Right upper leg, Left arm, Right lower leg, Left lower leg   Body parts bathed  by helper: Right arm, Left arm, Front perineal area, Buttocks, Left lower leg, Right lower leg Body parts n/a: Right arm, Left arm, Front perineal area, Buttocks, Right upper leg, Left upper leg, Right lower leg, Left lower leg   Bathing assist Assist Level: Moderate Assistance - Patient 50 - 74%     Upper Body Dressing/Undressing Upper body dressing   What is the patient wearing?: Pull over shirt    Upper body assist Assist Level: Minimal Assistance - Patient > 75%    Lower Body Dressing/Undressing Lower body dressing      What is the patient wearing?: Pants, Incontinence brief     Lower body assist Assist for lower body dressing: Maximal Assistance - Patient 25 - 49%     Toileting Toileting Toileting Activity did not occur (Clothing management and hygiene only): N/A (no void or bm)  Toileting assist Assist for toileting: Dependent - Patient 0%     Transfers Chair/bed transfer  Transfers assist  Chair/bed transfer activity did not occur: Safety/medical concerns  Chair/bed transfer assist level: Total Assistance - Patient < 25%     Locomotion Ambulation   Ambulation assist   Ambulation activity did not occur: Safety/medical concerns  Assist level: 2 helpers   Max distance: 65ft   Walk 10 feet activity   Assist  Walk 10 feet activity did not occur: Safety/medical concerns  Assist level: 2 helpers     Walk 50 feet activity   Assist Walk 50 feet with 2 turns activity did not occur: Safety/medical concerns         Walk 150 feet activity   Assist Walk 150 feet activity did not occur: Safety/medical concerns         Walk 10 feet on uneven surface  activity   Assist Walk 10 feet on uneven surfaces activity did not occur: Safety/medical concerns         Wheelchair     Assist Is the patient using a wheelchair?: Yes Type of Wheelchair: Manual    Wheelchair assist level: Dependent - Patient 0% Max wheelchair distance: 72ft     Wheelchair 50 feet with 2 turns activity    Assist    Wheelchair 50 feet with 2 turns activity did not occur: Safety/medical concerns   Assist Level: Dependent - Patient 0%   Wheelchair 150 feet activity     Assist  Wheelchair 150 feet activity did not occur: Safety/medical concerns   Assist Level: Dependent - Patient 0%   Blood pressure 100/71, pulse 75, temperature 98.2 F (36.8 C), temperature source Oral, resp. rate 18, height 5\' 1"  (1.549 m), weight 46.1 kg, SpO2 100%.  Medical Problem List and Plan: 1. 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  Impaired mobility: continue Lovenox, weekly creatinine ordered             -antiplatelet therapy: N/A  3. Knee paint: Tylenol as needed, continue voltaren gel scheduled.   XR reviewed and is stable, discussed with therapy  4. History of anxiety: discussed with her mom that she feels this was a big risk factor for her accident, propanolol ordered. Continue magnesium supplement  5. Neuropsych/cognition: This patient is not capable of making decisions on her own behalf  - 10/12: Still not capable of medical decision making; BUT oriented and communicating, following directions - MUCH improved .Will add PRN melatonin 3mg  10/20 for insomnia 6. Left buttock unstageable ulcer.  Continue Medihoney to buttock wound daily cover with foam dressing.  Changing dressing every 3 days or as needed soiling   7. Fluids/Electrolytes/Nutrition: Routine in and outs with follow-up chemistries  -Eating  well with cueing  8.  Cavitary right lower lobe pneumonia  likely aspiration pneumonia/MRSA pneumonia.  Resolved    Latest Ref Rng & Units 02/05/2023   11:12 AM 01/22/2023    6:36 AM 12/20/2022   10:54 AM  CBC  WBC 4.0 - 10.5 K/uL 8.5  8.1  5.6   Hemoglobin 12.0 - 15.0 g/dL 46.9  62.9  52.8   Hematocrit 36.0 - 46.0 % 46.7  47.5  42.8   Platelets 150 - 400 K/uL 284  274  253      9.  History of drug abuse.  Positive cocaine on urine drug screen.  Provide counseling  10.  AKI/hypovolemia and ATN.  Resolved Appears to have fair fluid intake, creatinine has been stable  11/3- Push fluids due ot low BP    Latest Ref Rng & Units 03/16/2023    6:53 AM 03/09/2023    6:13 AM 03/02/2023    7:24 AM  BMP  Creatinine 0.44 - 1.00 mg/dL 4.13  2.44  0.10     11.  Mild transaminitis with rhabdomyolysis.  Both resolved  12.  Hypotension. Resolved, d/c midodrine  -BP stable  11/2- BP was running 87 systolic last night- and max of 110 in last 24 hours- not dizzy per chart, but if develops any dizziness, suggest primary team restarts Midodrine. Amantadine can cause hypotension, FYI  11/3- No dizziness- BP on low side- will push fluids, but might restart Midodrine. If becomes yellow MEWS again, will restart midodrine today- d/w nursing  13. Impaired initiation: continue amantadine 100mg  BID  14. Hyperlycemia: CBGs mildly elevated, d/ced checks  15. Spasticity:   -discussed serial casting with OT and her mom  - continue baclofen to 20mg  TID and tizanidine to 4mg  TID. Continue wrist, elbow brace, and PRAFOs. Ordered a night splint for LLE  Repeat CMP today given chronic tizanidine use  -9/7 continues to have significant tone despite use of tizanidine and baclofen, may be beneficial consider baclofen pump in outpatient setting if this causes significant difficulties in care  9/8 consider decrease baclofen if dyskinetic movements continue  10/5-6  continue orthotics, ROM, positioning  when possible 16. Bowel and bladder incontinence: continue bowel and bladder  program  17. Left hand skin maceration: Eucerin ordered, discussed hand split with OT, continue  18. History of magnesium deficiency: magnesium 500mg  started HS  19. HTN: see above Increase  tizanidine to 6mg  TID. Magnesium supplement added HS. Increase propanolol to 20mg  TID, continue this dose, d/c clonidine patch given hypotension    03/20/2023    8:42 PM 03/20/2023    3:40 AM 03/19/2023    7:53 PM  Vitals with BMI  Systolic 100 99 99  Diastolic 71 73 57  Pulse 75 71 74     20. Area of redness between left thumb and index finger: continue foam dressing  21. Scalp eczema: selsun blue ordered, continue  -9/7 patient's mother feels like this is causing her to itch, will start some hydrocortisone cream  22. Fatigue: improved, discussed with team may be secondary to anti-spasticity medications, B12 reviewed and is 335, in suboptimal range, will give 1,000U B12 injection 8/7, will order monthly as needed, metanx started, increase to BID, continue  23. Right wrist swelling: XR ordered and shows no acute fracture, shows 2.50mm ulnar variance, brace removed to given patient a break from it. Voltaren gel ordered prn for discomfort, continue, CT ordered and show no acute osseus injury, continue ace wrap  23. Dysphagia: continue D3/thins--tolerating this diet  24. MRSA nares positive: negative on repeat, precautions d/ced  25. Tachycardic: resolved, d/c propanolol.  continueTizanidine increased to 6mg  TID. Resolved, amantadine increased back to 100mg  BID   10/28 HR controlled in 80's  11/3- HR controlled- con't regimen 26.  Dyskinetic movements in head and neck - appears to be myoclonic jerks related to hypoxic brain injury - as discussed with pt mom, no specific meds for this on antispasticity meds , consioder benzo although with pt's SUD would avoid this class  EEG reviewed and was negative for seizure.  Decrease baclofen to 15mg  TID  27. Small sore on the base of 5th metatarsal head:  local wound care, continue  28. Rash on face: resolved, discussed with mother could have been secondary to added sugars in nutritional supplements  29. Sacral pressure injury: air mattress ordered, Placed nursing order to offload q1 hour while awake, continue medihoney, wound care consulted, discussed with nursing that this is improving  30. Cervical extensor weakness: tried decreasing tizanidine but spasticity appears increased and BP elevated so will increase back to 6mg  TID, continue this dose, continue cervical collar  31. Low protein stores: d/c prosource since patient hates these and is eating 100% of meals  32. Intermittent lethargy: labs reviewed and are stable, discussed with team may be secondary to anti-spasticity medications  33. Blood blister to left heel: continue foam heel protectors.  34. Suboptimal vitamin D level: increase to 5,000 U daily. continue this dose  35. Hypotension: d/c propanolol, decrease baclofen to 10mg  HS, continue this dose, BP reviewed and well controlled, continue current regimen  26. Foot pain: tramadol ordered prn, improved today, continue prn  27. S/p fall: CT head reviewed and is stable  28. Bladder incontinence: continue bladder program  29. Headaches: continue tylenol and tramadol prn    LOS: 123 days A FACE TO FACE EVALUATION WAS PERFORMED  Nicole [name] P Donnabelle Blanchard 03/21/2023, 10:32 AM

## 2023-03-21 NOTE — Plan of Care (Signed)
[name] plan of care adjusted to 15/7 after speaking with care team and discussed with MD in team conference as pt currently unable to tolerate current therapy schedule with OT, PT, and SLP. Pt is making more progress lately and would benefit from increasing her therapy

## 2023-03-21 NOTE — Progress Notes (Signed)
Speech Language Pathology Daily Session Note  Patient Details  Name: Nicole [name] MRN: [id number] Date of Birth: Apr 06, 1976  Today's Date: 03/21/2023 SLP Individual Time: 4166-0630 SLP Individual Time Calculation (min): 17 min  Short Term Goals: Week 9: SLP Short Term Goal 1 (Week 9): Week 17 - Pt will perform verbal problem solving tasks with overall Min A verbal and visual cues to verbalize solutions. SLP Short Term Goal 2 (Week 9): Week 17-Patient will utilize external aids for orientation to place and time with supervision level verbal cues SLP Short Term Goal 3 (Week 9): Week 17 - Pt will demonstrate sustained attention to basic tasks for 12 minutes with Mod A multimodal cues for redirection. SLP Short Term Goal 4 (Week 9): Week 17 - Pt will utilize increased vocal intensity w/ MinA verbal cues to remain 90% intelligibile at phrase level  Skilled Therapeutic Interventions: SLP conducted skilled therapy session targeting cognitive retraining goals. Patient participatory throughout session with notably improved vocal intensity. Intelligibility prior to cues for repetition ~50%, though with cues increased to 80% at the sentence level. Patient stated current events independently and accurately, though disoriented to date. Sustained attention to therapy tasks for 12 minutes given min-mod assist for redirection. Near end of session, patient stated the need to use the bathroom. Patient returned to room, nursing staff alerted, as patient is mobilizing via MaxiMove +2. Patient left with nursing staff for toileting and other nursing care items. SLP will continue to target goals per plan of care.      Pain Pain Assessment Pain Scale: 0-10 Pain Score: 0-No pain  Therapy/Group: Individual Therapy  [name], M.A., CCC-SLP  [name] 03/21/2023, 10:56 AM

## 2023-03-21 NOTE — Plan of Care (Signed)
  Problem: RH BOWEL ELIMINATION Goal: RH STG MANAGE BOWEL WITH ASSISTANCE Description: STG Manage Bowel with mod I Assistance. Outcome: Not Progressing; LBM 11/3   Problem: RH BLADDER ELIMINATION Goal: RH STG MANAGE BLADDER WITH ASSISTANCE Description: STG Manage Bladder With toileting Assistance Outcome: Not Progressing; incontinence

## 2023-03-21 NOTE — Progress Notes (Signed)
Occupational Therapy Session Note  Patient Details  Name: Nicole [name] MRN: [id number] Date of Birth: Mar 01, 1976  Today's Date: 03/21/2023 OT Individual Time: 0830-0900 OT Individual Time Calculation (min): 30 min    Short Term Goals: Week 6:  OT Short Term Goal 1 (Week 6): Patient will indicate need to void an dassist with trasnfer with max assist OT Short Term Goal 1 - Progress (Week 6): Other (comment) OT Short Term Goal 2 (Week 6): Patient will utilize left hand x 3 to aide with bimaual tasks with min cueing OT Short Term Goal 2 - Progress (Week 6): Other (comment) OT Short Term Goal 3 (Week 6): Patient will pull self to partial standing with mod assist OT Short Term Goal 3 - Progress (Week 6): Other (comment)  Skilled Therapeutic Interventions/Progress Updates:    1:1 Pt received at the sink brushing her teeth. Pt oriented to time of day. She requested to brush her hair and put in her hair into pony tail- assisted with mod A. Performed stand/ squat pivot transfer to the toilet with max A ; pt initiated coming into squat position with grab bar with right hand. Pt also advanced her right leg on her own towards the toilet. Pt able maintain sitting balance on the toilet with contact guard - however didn't tolerate sitting on the toilet (hurt her bottom). Total A for clothing management while managing her balance with max A . Pt did not void on the toilet but did participate in getting the toilet paper and wiping her front. Pt transferred back to the w/c to the left with max A. Pt propelled the w/c with bilateral feet ~ 30 feet down the hallway with encouragement. In the dayroom pt was able to engaged in using the kinenotron demonstrating reciprocal peddling bilaterally with only encouragement on the less resistive level.  Pt left in the room in her w/c with safety belt donned and telesitter in place.   Therapy Documentation Precautions:  Precautions Precautions: Fall Precaution Comments:  delayed processing, Increased tone UB and LB Required Braces or Orthoses: Other Brace Other Brace: B adjustable night splints; B elbow "cosey" splints; L palm protector; R mitt Restrictions Weight Bearing Restrictions: No  Pain: Pain Assessment Pain Scale: 0-10 Pain Score: 0-No pain   Therapy/Group: Individual Therapy  Roney Mans [facility] 03/21/2023, 11:17 AM

## 2023-03-21 NOTE — Progress Notes (Signed)
Physical Therapy Session Note  Patient Details  Name: Nicole [name] MRN: [id number] Date of Birth: 08-11-1975  Today's Date: 03/21/2023 PT Individual Time: 1100-1130 PT Individual Time Calculation (min): 30 min   Short Term Goals: Week 3:  PT Short Term Goal 1 (Week 3): Pt will initiate bed mobility but require no more than totalA PT Short Term Goal 1 - Progress (Week 3): Not met PT Short Term Goal 2 (Week 3): Pt will maintain sitting balance unsupported for 30 seconds PT Short Term Goal 2 - Progress (Week 3): Not met PT Short Term Goal 3 (Week 3): Pt will demonstrate command follow for ~15% of tasks. PT Short Term Goal 3 - Progress (Week 3): Not met  Skilled Therapeutic Interventions/Progress Updates:      Pt received sitting at the nurses station - reports she wants a change of environment. Continues to report chronic L knee pain - mobility and distraction provided for pain management.   Pt assisted onto Tilt Table with +2 assist (dependent lift). Assist for repositioning and straps donned to provide pelvic/trunk support. Pt needing assist for placing BLE into weight bearing position. Tilt Table raised to 60 deg to promote upright, extension, and and weight bearing through her legs. Unable to tolerate knee extension bilaterally in weight bearing position due to tight heel cords, L > R. Aided her in PROM for cervical rotation/extension and lateral flexion. Also work on pectoral stretching and bringing arms out into extension.  Returned to manual W/C in similar manner as above and returned to her room, per her request, and was left sitting with all needs met, seat belt alarm on.   Therapy Documentation Precautions:  Precautions Precautions: Fall Precaution Comments: delayed processing, Increased tone UB and LB Required Braces or Orthoses: Other Brace Other Brace: B adjustable night splints; B elbow "cosey" splints; L palm protector; R mitt Restrictions Weight Bearing Restrictions:  No General:       Therapy/Group: Individual Therapy  Orrin Brigham 03/21/2023, 7:44 AM

## 2023-03-22 NOTE — Plan of Care (Signed)
  Problem: RH BOWEL ELIMINATION Goal: RH STG MANAGE BOWEL WITH ASSISTANCE Description: STG Manage Bowel with mod I Assistance. Outcome: Not Progressing; LBM 11/6   Problem: RH BLADDER ELIMINATION Goal: RH STG MANAGE BLADDER WITH ASSISTANCE Description: STG Manage Bladder With toileting Assistance Outcome: Not Progressing; incontinence

## 2023-03-22 NOTE — Progress Notes (Signed)
Speech Language Pathology Weekly Progress and Session Note  Patient Details  Name: Nicole [name] MRN: [id number] Date of Birth: 04-17-1976  Beginning of progress report period: March 16, 2023 End of progress report period: March 22, 2023  Today's Date: 03/22/2023 SLP Individual Time: 1445-1526 SLP Individual Time Calculation (min): 41 min  Short Term Goals: Week 17: SLP Short Term Goal 1 (Week 9): Week 17 - Pt will perform verbal problem solving tasks with overall Min A verbal and visual cues to verbalize solutions. SLP Short Term Goal 1 - Progress (Week 9): Met SLP Short Term Goal 2 (Week 9): Week 17-Patient will utilize external aids for orientation to place and time with supervision level verbal cues SLP Short Term Goal 2 - Progress (Week 9): Not met SLP Short Term Goal 3 (Week 9): Week 17 - Pt will demonstrate sustained attention to basic tasks for 12 minutes with Mod A multimodal cues for redirection. SLP Short Term Goal 3 - Progress (Week 9): Met SLP Short Term Goal 4 (Week 9): Week 17 - Pt will utilize increased vocal intensity w/ MinA verbal cues to remain 90% intelligibile at phrase level SLP Short Term Goal 4 - Progress (Week 9): Met  New Short Term Goals: Week 18: SLP Short Term Goal 1 (Week 9): Week 18 - Pt will solve basic to mildly complex problems with 80% accuracy given min assist. SLP Short Term Goal 2 (Week 9): Week 18 -Patient will utilize external aids for orientation to place and time with supervision level verbal cues SLP Short Term Goal 3 (Week 9): Week 18 - Pt will demonstrate sustained attention to basic tasks for 15 minutes with Mod A multimodal cues for redirection. SLP Short Term Goal 4 (Week 9): Week 18 - Pt will utilize increased vocal intensity w/ MinA verbal cues to remain 90% intelligibile at sentence level  Weekly Progress Updates: Patient has made excellent progress towards weekly goals, meeting 3/4 short term goals set this reporting period. Patient is  at an overall min assist level for orientation using external aids and requires mod assist to sustain attention to task for 12 minutes. Patient solves basic verbally presented problems with min assist and requires overall min assist for speech intelligibility at the phrase level, though at most recent session required supervision at the sentence level for speech intelligibility. Patient and family education ongoing. Patient will continue to benefit from skilled therapy services during remainder of CIR stay.    Intensity: Minumum of 1-2 x/day, 30 to 90 minutes Frequency: 3 to 5 out of 7 days Duration/Length of Stay: TBD due to SNF placement Treatment/Interventions: Cognitive remediation/compensation;Internal/external aids;Speech/Language facilitation;Cueing hierarchy;Environmental controls;Therapeutic Activities;Functional tasks;Multimodal communication approach;Patient/family education;Therapeutic Exercise  Daily Session  Skilled Therapeutic Interventions: SLP conducted skilled therapy session targeting cognitive retraining goals. Patient greeted and agreeable to speech therapy session. During structured dance task, patient benefited from one to one cues and verbal models to move her UEs and LEs accurately according to dance target. Patient sustained attention to dance given mod assist though required max assist when overt distractions garnered her attention. Patient requested to transfer back to room halfway through dance activity and then was min assist for safety for transfer back to bed. Throughout session, patient's speech was much improved, requiring supervision assist for 100% speech intelligibility at the sentence level. Patient was left in lowered bed with call bell in reach and bed alarm set. SLP will continue to target goals per plan of care.      Pain  Pain Assessment Pain Scale: Faces Pain Score: 3  Faces Pain Scale: Hurts little more Pain Location: Leg Pain Orientation:  Left  Therapy/Group: Individual Therapy  [name], M.A., CCC-SLP  [name] 03/22/2023, 3:39 PM

## 2023-03-22 NOTE — Progress Notes (Signed)
PROGRESS NOTE   Subjective/Complaints: Discussed left hand spasticity, that it doesn't bother her greatly and that she can flex hand well, complete extension is limited, discussed that I think spasticity is more likely than trigger finger  Has no other complaints.     ROS:    Pt denies SOB, abd pain, CP, N/V/C/D, and vision changes  . +bladder incontinence, +migraines- well controlled, +sapsticity  Objective:   No results found. No results for input(s): "WBC", "HGB", "HCT", "PLT" in the last 72 hours.     No results for input(s): "NA", "K", "CL", "CO2", "GLUCOSE", "BUN", "CREATININE", "CALCIUM" in the last 72 hours.    Intake/Output Summary (Last 24 hours) at 03/22/2023 1031 Last data filed at 03/22/2023 0745 Gross per 24 hour  Intake 480 ml  Output --  Net 480 ml       Pressure Injury 02/09/23 Heel Left Deep Tissue Pressure Injury - Purple or maroon localized area of discolored intact skin or blood-filled blister due to damage of underlying soft tissue from pressure and/or shear. Black/purple and boggy,  size of a quar (Active)  02/09/23 0543  Location: Heel  Location Orientation: Left  Staging: Deep Tissue Pressure Injury - Purple or maroon localized area of discolored intact skin or blood-filled blister due to damage of underlying soft tissue from pressure and/or shear.  Wound Description (Comments): Black/purple and boggy,  size of a quarter.  Foam dressing applied  Present on Admission: No    Physical Exam: Vital Signs Blood pressure 100/72, pulse 79, temperature 98.5 F (36.9 C), temperature source Oral, resp. rate 16, height 5\' 1"  (1.549 m), weight 48.8 kg, SpO2 98%.     General: awake, alert, sitting up in bed- poor initiation; NAD HENT: conjugate gaze; oropharynx moist CV: regular rate and rhythm; no JVD Pulmonary: CTA B/L; no W/R/R- good air movement GI: soft, NT, ND, (+)BS Psychiatric:  engaging, interactive, impulsive at times Neurological: delayed responses- denies dizziness/lightheadedness Skin: + Scaling dried skin on bilateral heels  - 2 small excoriations, sore on the base of the 5th metatarsal head -no longer present - rash on face - no longer present - stage 2 to right toe -no longer present  + area of redness between left thumb and index finger-now minimal, purple bruise left buttock unstageable - not examined +redness to bilateral elbows -not appreciated + Left heel stage III DTI, covered in foam dressing  Neurologic: AAOx2,  to year but not month or date. Follows most simple commands.  + Hypophonation, speech intelligability much improved  Prior exams: MAS 1 right elbow,MAS 1 in RIght finger and wrist flexors  MAS 3 knee flexors, and MAS 3 L>R  hip adductors  MAS 3 L elbow , MAS 3-4 in L wrist and finger flexors  + LUE WHO--- Hypersensitivity to left hand improved, +left hand contracture  Motor 0/5 strength in LUE and LLE, showing volitional movements in right lower extremity but difficulty to obtain MMT due to her pain/braces, able to do upper body dressing now! Exam stable 11/7  Twice daily for right upper extremity 1/5 finger flexion, otherwise no volitional movement of left upper extremity or bilateral lowers + BL UE WHO.  Musculoskeletal: reduced bilateral shoulder and elbow ROM due to increased tone , pain with passive knee extension bilaterally   . GU: bladder incontinence  Assessment/Plan: 1. Functional deficits which require 3+ hours per day of interdisciplinary therapy in a comprehensive inpatient rehab setting. Physiatrist is providing close team supervision and 24 hour management of active medical problems listed below. Physiatrist and rehab team continue to assess barriers to discharge/monitor patient progress toward functional and medical goals  Care Tool:  Bathing    Body parts bathed by patient: Face, Abdomen, Chest, Left upper  leg, Right upper leg, Left arm, Right lower leg, Left lower leg   Body parts bathed by helper: Right arm, Left arm, Front perineal area, Buttocks, Left lower leg, Right lower leg Body parts n/a: Right arm, Left arm, Front perineal area, Buttocks, Right upper leg, Left upper leg, Right lower leg, Left lower leg   Bathing assist Assist Level: Moderate Assistance - Patient 50 - 74%     Upper Body Dressing/Undressing Upper body dressing   What is the patient wearing?: Pull over shirt    Upper body assist Assist Level: Minimal Assistance - Patient > 75%    Lower Body Dressing/Undressing Lower body dressing      What is the patient wearing?: Pants, Incontinence brief     Lower body assist Assist for lower body dressing: Maximal Assistance - Patient 25 - 49%     Toileting Toileting Toileting Activity did not occur (Clothing management and hygiene only): N/A (no void or bm)  Toileting assist Assist for toileting: Maximal Assistance - Patient 25 - 49%     Transfers Chair/bed transfer  Transfers assist  Chair/bed transfer activity did not occur: Safety/medical concerns  Chair/bed transfer assist level: Total Assistance - Patient < 25%     Locomotion Ambulation   Ambulation assist   Ambulation activity did not occur: Safety/medical concerns  Assist level: 2 helpers   Max distance: 73ft   Walk 10 feet activity   Assist  Walk 10 feet activity did not occur: Safety/medical concerns  Assist level: 2 helpers     Walk 50 feet activity   Assist Walk 50 feet with 2 turns activity did not occur: Safety/medical concerns         Walk 150 feet activity   Assist Walk 150 feet activity did not occur: Safety/medical concerns         Walk 10 feet on uneven surface  activity   Assist Walk 10 feet on uneven surfaces activity did not occur: Safety/medical concerns         Wheelchair     Assist Is the patient using a wheelchair?: Yes Type of Wheelchair:  Manual    Wheelchair assist level: Dependent - Patient 0% Max wheelchair distance: 11ft    Wheelchair 50 feet with 2 turns activity    Assist    Wheelchair 50 feet with 2 turns activity did not occur: Safety/medical concerns   Assist Level: Dependent - Patient 0%   Wheelchair 150 feet activity     Assist  Wheelchair 150 feet activity did not occur: Safety/medical concerns   Assist Level: Dependent - Patient 0%   Blood pressure 100/72, pulse 79, temperature 98.5 F (36.9 C), temperature source Oral, resp. rate 16, height 5\' 1"  (1.549 m), weight 48.8 kg, SpO2 98%.  Medical Problem List and Plan: 1. 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  Impaired mobility: continue Lovenox, weekly creatinine ordered             -antiplatelet therapy: N/A  3. Knee paint: Tylenol as needed, continue voltaren gel scheduled.   XR reviewed and is stable, discussed with therapy  4. History of anxiety: discussed with her mom that she feels this was a big risk factor for her accident, propanolol ordered. Continue magnesium supplement  5. Neuropsych/cognition: This patient is not capable of making decisions on her own behalf  - 10/12: Still not capable of medical decision making; BUT oriented and communicating, following directions - MUCH improved .Will add PRN melatonin 3mg  10/20 for insomnia 6. Left buttock unstageable ulcer.  Continue Medihoney to buttock wound daily cover with foam dressing.  Changing dressing every 3 days or as needed soiling   7. Fluids/Electrolytes/Nutrition: Routine in and outs with  follow-up chemistries  -Eating  well with cueing  8.  Cavitary right lower lobe pneumonia likely aspiration pneumonia/MRSA pneumonia.  Resolved    Latest Ref Rng & Units 02/05/2023   11:12 AM 01/22/2023    6:36 AM 12/20/2022   10:54 AM  CBC  WBC 4.0 - 10.5 K/uL 8.5  8.1  5.6   Hemoglobin 12.0 - 15.0 g/dL 16.1  09.6  04.5   Hematocrit 36.0 - 46.0 % 46.7  47.5  42.8   Platelets 150 - 400 K/uL 284  274  253      9.  History of drug abuse.  Positive cocaine on urine drug screen.  Provide counseling  10.  AKI/hypovolemia and ATN.  Resolved Appears to have fair fluid intake, creatinine has been stable  11/3- Push fluids due ot low BP    Latest Ref Rng & Units 03/16/2023    6:53 AM 03/09/2023    6:13 AM 03/02/2023    7:24 AM  BMP  Creatinine 0.44 - 1.00 mg/dL 4.09  8.11  9.14     11.  Mild transaminitis with rhabdomyolysis.  Both resolved  12.  Hypotension. Resolved, d/c midodrine  -BP stable  11/2- BP was running 87 systolic last night- and max of 110 in last 24 hours- not dizzy per chart, but if develops any dizziness, suggest primary team restarts Midodrine. Amantadine can cause hypotension, FYI  11/3- No dizziness- BP on low side- will push fluids, but might restart Midodrine. If becomes yellow MEWS again, will restart midodrine today- d/w nursing  13. Impaired initiation: continue amantadine 100mg  BID  14. Hyperglycemia: CBGs mildly elevated, d/ced checks. Prosource d/ced as per patient's request  15. Spasticity:   -discussed serial casting with OT and her mom but she defers given pain she had when this was attempted  -baclofen and tizanidine decreased due to hypotension, spasticity has been stable. Continue wrist, elbow brace, and PRAFOs. Ordered a night splint for LLE  Repeat CMP today given chronic tizanidine use  -9/7 continues to have significant tone despite use of tizanidine and baclofen, may be beneficial consider baclofen pump in outpatient setting if this causes  significant difficulties in care  9/8 consider decrease baclofen if dyskinetic movements continue  10/5-6  continue orthotics, ROM, positioning  when possible 16.  Bowel and bladder incontinence: continue bowel and bladder program  17. Left hand skin maceration: Eucerin ordered, discussed hand split with OT, continue  18. History of magnesium deficiency: magnesium 500mg  started HS  19. HTN: see above Increase tizanidine to 6mg  TID. Magnesium supplement added HS. Increase propanolol to 20mg  TID, continue this dose, d/c clonidine patch given hypotension    03/22/2023    5:24 AM 03/21/2023    8:25 PM 03/21/2023    3:36 PM  Vitals with BMI  Systolic 100 100 97  Diastolic 72 68 63  Pulse 79 93 94     20. Area of redness between left thumb and index finger: continue foam dressing  21. Scalp eczema: selsun blue ordered, continue  -9/7 patient's mother feels like this is causing her to itch, will start some hydrocortisone cream  22. Fatigue: improved, discussed with team may be secondary to anti-spasticity medications, B12 reviewed and is 335, in suboptimal range, will give 1,000U B12 injection 8/7, will order monthly as needed, metanx started, increase to BID, continue  23. Right wrist swelling: XR ordered and shows no acute fracture, shows 2.58mm ulnar variance, brace removed to given patient a break from it. Voltaren gel ordered prn for discomfort, continue, CT ordered and show no acute osseus injury, continue ace wrap  23. Dysphagia: continue D3/thins--tolerating this diet  24. MRSA nares positive: negative on repeat, precautions d/ced  25. Tachycardic: resolved, d/c propanolol.  continueTizanidine increased to 6mg  TID. Resolved, amantadine increased back to 100mg  BID   10/28 HR controlled in 80's  11/3- HR controlled- con't regimen 26.  Dyskinetic movements in head and neck - appears to be myoclonic jerks related to hypoxic brain injury - as discussed with pt mom, no specific meds for  this on antispasticity meds , consioder benzo although with pt's SUD would avoid this class  EEG reviewed and was negative for seizure.  Decrease baclofen to 15mg  TID  27. Small sore on the base of 5th metatarsal head: local wound care, continue  28. Rash on face: resolved, discussed with mother could have been secondary to added sugars in nutritional supplements  29. Sacral pressure injury: air mattress ordered, Placed nursing order to offload q1 hour while awake, continue medihoney, wound care consulted, discussed with nursing that this is improving  30. Cervical extensor weakness: tried decreasing tizanidine but spasticity appears increased and BP elevated so will increase back to 6mg  TID, continue this dose, continue cervical collar  31. Low protein stores: d/c prosource since patient hates these and is eating 100% of meals  32. Intermittent lethargy: labs reviewed and are stable, discussed with team may be secondary to anti-spasticity medications  33. Blood blister to left heel: continue foam heel protectors.  34. Suboptimal vitamin D level: increase to 5,000 U daily. continue this dose  35. Hypotension: d/c propanolol, decrease baclofen to 10mg  HS, continue this dose, BP reviewed and well controlled, continue current regimen  26. Foot pain: tramadol ordered prn, improved today, continue prn  27. S/p fall: CT head reviewed and is stable  28. Bladder incontinence: continue bladder program  29. Headaches: continue tylenol and tramadol prn    LOS: 124 days A FACE TO FACE EVALUATION WAS PERFORMED  Drema Pry Lameka Disla 03/22/2023, 10:31 AM

## 2023-03-22 NOTE — Progress Notes (Signed)
Occupational Therapy Weekly Progress Note  Patient Details  Name: Nicole [name] MRN: [id number] Date of Birth: June 04, 1975  Beginning of progress report period: March 14, 2023 End of progress report period: March 22, 2023  Today's Date: 03/22/2023 OT Individual Time: 1914-7829 OT Individual Time Calculation (min): 74 min    Patient has met 1 of 2 short term goals. Pt is making significant gains with BADL's, supported standing in STEDY, and L UE integration during functional tasks. Pt demonstrated overall L hand sensitivity at times and stiffness but with prepatory activity such as warmth and gentle SROM, lotion application pt with increased integration. Pt performed multiple ADL and leisure activities in STEDY supported standing level with intermittent rests after 1-2 minute intervals. Sink side ADL's improving with initiation, attention and B integration. Continues to demonstrate impulsivity, significant motor control deficits during transfers and high falls risk requiring further skilled OT services.   Patient continues to demonstrate the following deficits: muscle weakness, muscle joint tightness, and muscle paralysis, decreased cardiorespiratoy endurance, impaired timing and sequencing, abnormal tone, unbalanced muscle activation, motor apraxia, decreased coordination, and decreased motor planning, decreased midline orientation, decreased attention to left, and decreased motor planning, decreased initiation, decreased attention, decreased awareness, decreased problem solving, decreased safety awareness, decreased memory, and delayed processing, and decreased sitting balance, decreased standing balance, decreased postural control, hemiplegia, and decreased balance strategies and therefore will continue to benefit from skilled OT intervention to enhance overall performance with BADL, iADL, Vocation, and Reduce care partner burden.  Patient progressing toward long term goals..  Continue plan of  care.  OT Short Term Goals Week 16: OT Short Term Goal 1 (Week 9): Patient will change orientation of toothbrush/ hairbrush to accomodate task with min assist after set up- progressing toward goal  OT Short Term Goal 2 (Week 9): Patient will spear food x 3 once utensil in hand, then bring to mouth with only contact assistance- met  Week 17: STG=LTG's d/t LOS   Skilled Therapeutic Interventions/Progress Updates:   Pt seen for skilled OT session with focus on weekly reassessment of function bathroom, sink side as well as in STEDY for standing activity, and leisure activity for [employer] and table top craft. Use of STEDY for toileting simulation in bathroom, standing support in STEDY for part of hand washing and w/c level oral and hair care. L hand moist heat and self lotion application as well as SROM encouraged with cues and demo. OT transported to day room for [employer] activities with + 2 support for STEDY at table for supported standing ring toss over apples with R hand reach and min facilitation and cues. Trials with L UE with hand over hand to reach but pt able to initiate toss with mod cues and demo. Pt then completed seated level painting activity with excellent focus due to high interest and R hand detailed oriented for larger areas and 80% accuracy, 75 % accuracy with smaller regions with R hand and L hand as support of craft. Hand off to dance group for participation with TR.   Pain: no report of pain only L hand sensitivity and L knee pressure when in STEDY, repositioning and pillow support for relief  Therapy Documentation Precautions:  Precautions Precautions: Fall Precaution Comments: delayed processing, Increased tone UB and LB Required Braces or Orthoses: Other Brace Other Brace: B adjustable night splints; B elbow "cosey" splints; L palm protector; R mitt Restrictions Weight Bearing Restrictions: No   Therapy/Group: Individual Therapy  Altamease Oiler  Sherryle Lis 03/22/2023, 7:53 AM

## 2023-03-22 NOTE — Progress Notes (Signed)
Patient ID: Nicole [name], female   DOB: [birth year], 47 y.o.   MRN: [id number]  Left message with Amy-Administrator at [facility] will await return call. Also left messages with Archie Patten at [facility]

## 2023-03-22 NOTE — Progress Notes (Signed)
Physical Therapy Session Note  Patient Details  Name: Nicole [name] MRN: [id number] Date of Birth: [birth year]  Today's Date: 03/22/2023 PT Individual Time: 1000-1058 PT Individual Time Calculation (min): 58 min   Short Term Goals: Week 3:  PT Short Term Goal 1 (Week 3): Pt will initiate bed mobility but require no more than totalA PT Short Term Goal 1 - Progress (Week 3): Not met PT Short Term Goal 2 (Week 3): Pt will maintain sitting balance unsupported for 30 seconds PT Short Term Goal 2 - Progress (Week 3): Not met PT Short Term Goal 3 (Week 3): Pt will demonstrate command follow for ~15% of tasks. PT Short Term Goal 3 - Progress (Week 3): Not met  Skilled Therapeutic Interventions/Progress Updates:      Pt in bed to start - agreeable to therapy.   Assisted with UB/LB dressing - LB dressing with maxA for threading and pulling over hips - pt able to bridge and fully clear buttock to pull pants up.   Supine<>sitting EOB with modA for initiation and managing her LLE and trunk support. Completed UB dressing with modA and minA for sitting balance. Socks/shoes donned with dependent assist for time.  Squat pivot transfer with totalA from EOB into w/c and then transported to day room rehab gym.  Used Stedy to facilitate standing, sit<>stands, and prolonged weight bearing in perched position. Patient able to complete sit<>stands in [place] with just minA (!!!) by pulling up to stand. She can sit in perched position with as little as CGA. She completed several stands from perched position with CGA. (Unable to achieve full upright in standing with hips/knees/trunk flexed.   She participated in therapeutic activities in Day Room rehab gym with the "Fall Festival" theme - mostly at Corene Cornea level to challenge endurance and continued weight bearing through BLE. She did participate in the "bobbing for apples" game at wheelchair level while she used the reacher with her RUE (purposefully) and was able  to get x1 apple in/out of th bucket.   Pt ended treatment sitting in w/c with seat belt alarm on, call bell within reach, and telesitter in room. All needs met at end.    Therapy Documentation Precautions:  Precautions Precautions: Fall Precaution Comments: delayed processing, Increased tone UB and LB Required Braces or Orthoses: Other Brace Other Brace: B adjustable night splints; B elbow "cosey" splints; L palm protector; R mitt Restrictions Weight Bearing Restrictions: No General:      Therapy/Group: Individual Therapy  Orrin Brigham 03/22/2023, 7:56 AM

## 2023-03-23 LAB — CREATININE, SERUM
Creatinine, Ser: 0.72 mg/dL (ref 0.44–1.00)
GFR, Estimated: >60 mL/min (ref >60)

## 2023-03-23 NOTE — Progress Notes (Signed)
PROGRESS NOTE   Subjective/Complaints: No new complaints this morning MEWS 0 Tolerated therapy well today BP normalized   ROS:    Pt denies SOB, abd pain, CP, N/V/C/D, and vision changes  . +bladder incontinence, +migraines- well controlled, +sapsticity- stable  Objective:   No results found. No results for input(s): "WBC", "HGB", "HCT", "PLT" in the last 72 hours.     Recent Labs    03/23/23 0511  CREATININE 0.72      Intake/Output Summary (Last 24 hours) at 03/23/2023 1003 Last data filed at 03/23/2023 0735 Gross per 24 hour  Intake 957 ml  Output --  Net 957 ml       Pressure Injury 02/09/23 Heel Left Deep Tissue Pressure Injury - Purple or maroon localized area of discolored intact skin or blood-filled blister due to damage of underlying soft tissue from pressure and/or shear. Black/purple and boggy,  size of a quar (Active)  02/09/23 0543  Location: Heel  Location Orientation: Left  Staging: Deep Tissue Pressure Injury - Purple or maroon localized area of discolored intact skin or blood-filled blister due to damage of underlying soft tissue from pressure and/or shear.  Wound Description (Comments): Black/purple and boggy,  size of a quarter.  Foam dressing applied  Present on Admission: No    Physical Exam: Vital Signs Blood pressure 105/75, pulse 84, temperature 98.2 F (36.8 C), resp. rate 18, height 5\' 1"  (1.549 m), weight 48.8 kg, SpO2 97%.     General: awake, alert, sitting up in bed- poor initiation; NAD HENT: conjugate gaze; oropharynx moist CV: regular rate and rhythm; no JVD Pulmonary: CTA B/L; no W/R/R- good air movement GI: soft, NT, ND, (+)BS Psychiatric: engaging, interactive, impulsive at times Neurological: delayed responses- denies dizziness/lightheadedness Skin: + Scaling dried skin on bilateral heels  - 2 small excoriations, sore on the base of the 5th metatarsal head -no  longer present - rash on face - no longer present - stage 2 to right toe -no longer present  + area of redness between left thumb and index finger-now minimal, purple bruise left buttock unstageable - not examined +redness to bilateral elbows -not appreciated + Left heel stage III DTI, covered in foam dressing  Neurologic: AAOx2,  to year but not month or date. Follows most simple commands.  + Hypophonation, speech intelligability much improved  Prior exams: MAS 1 right elbow,MAS 1 in RIght finger and wrist flexors  MAS 3 knee flexors, and MAS 3 L>R  hip adductors  MAS 3 L elbow , MAS 3-4 in L wrist and finger flexors  + LUE WHO--- Hypersensitivity to left hand improved, +left hand contracture  Motor 0/5 strength in LUE and LLE, showing volitional movements in right lower extremity but difficulty to obtain MMT due to her pain/braces, able to do upper body dressing now! Exam stable 11/8  Twice daily for right upper extremity 1/5 finger flexion, otherwise no volitional movement of left upper extremity or bilateral lowers + BL UE WHO.    Musculoskeletal: reduced bilateral shoulder and elbow ROM due to increased tone , pain with passive knee extension bilaterally   . GU: bladder incontinence  Assessment/Plan: 1. Functional deficits which  require 3+ hours per day of interdisciplinary therapy in a comprehensive inpatient rehab setting. Physiatrist is providing close team supervision and 24 hour management of active medical problems listed below. Physiatrist and rehab team continue to assess barriers to discharge/monitor patient progress toward functional and medical goals  Care Tool:  Bathing    Body parts bathed by patient: Face, Abdomen, Chest, Left upper leg, Right upper leg, Left arm, Right lower leg, Left lower leg   Body parts bathed by helper: Right arm, Left arm, Front perineal area, Buttocks, Left lower leg, Right lower leg Body parts n/a: Right arm, Left arm, Front perineal  area, Buttocks, Right upper leg, Left upper leg, Right lower leg, Left lower leg   Bathing assist Assist Level: Moderate Assistance - Patient 50 - 74%     Upper Body Dressing/Undressing Upper body dressing   What is the patient wearing?: Pull over shirt    Upper body assist Assist Level: Minimal Assistance - Patient > 75%    Lower Body Dressing/Undressing Lower body dressing      What is the patient wearing?: Pants, Incontinence brief     Lower body assist Assist for lower body dressing: Maximal Assistance - Patient 25 - 49%     Toileting Toileting Toileting Activity did not occur (Clothing management and hygiene only): N/A (no void or bm)  Toileting assist Assist for toileting: Maximal Assistance - Patient 25 - 49%     Transfers Chair/bed transfer  Transfers assist  Chair/bed transfer activity did not occur: Safety/medical concerns  Chair/bed transfer assist level: Total Assistance - Patient < 25%     Locomotion Ambulation   Ambulation assist   Ambulation activity did not occur: Safety/medical concerns  Assist level: 2 helpers   Max distance: 39ft   Walk 10 feet activity   Assist  Walk 10 feet activity did not occur: Safety/medical concerns  Assist level: 2 helpers     Walk 50 feet activity   Assist Walk 50 feet with 2 turns activity did not occur: Safety/medical concerns         Walk 150 feet activity   Assist Walk 150 feet activity did not occur: Safety/medical concerns         Walk 10 feet on uneven surface  activity   Assist Walk 10 feet on uneven surfaces activity did not occur: Safety/medical concerns         Wheelchair     Assist Is the patient using a wheelchair?: Yes Type of Wheelchair: Manual    Wheelchair assist level: Dependent - Patient 0% Max wheelchair distance: 57ft    Wheelchair 50 feet with 2 turns activity    Assist    Wheelchair 50 feet with 2 turns activity did not occur: Safety/medical  concerns   Assist Level: Dependent - Patient 0%   Wheelchair 150 feet activity     Assist  Wheelchair 150 feet activity did not occur: Safety/medical concerns   Assist Level: Dependent - Patient 0%   Blood pressure 105/75, pulse 84, temperature 98.2 F (36.8 C), resp. rate 18, height 5\' 1"  (1.549 m), weight 48.8 kg, SpO2 97%.  Medical Problem List and Plan: 1. 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  Impaired mobility: continue Lovenox, weekly creatinine ordered             -antiplatelet therapy: N/A  3. Knee paint: Tylenol as needed, continue voltaren gel scheduled.   XR reviewed and is stable, discussed with therapy  4. History of anxiety: discussed with her mom that she feels this was a big risk factor for her accident, propanolol ordered. Continue magnesium supplement  5. Neuropsych/cognition: This patient is not capable of making decisions on her own behalf  - 10/12: Still not capable of medical decision making; BUT oriented and communicating, following directions - MUCH improved .Will add PRN melatonin 3mg  10/20 for insomnia 6. Left buttock unstageable ulcer.  Continue Medihoney to buttock wound daily cover with foam dressing.  Changing dressing every 3 days or as needed soiling   7. Fluids/Electrolytes/Nutrition: Routine in and outs with follow-up chemistries  -Eating  well with cueing  8.  Cavitary right lower lobe pneumonia likely aspiration pneumonia/MRSA pneumonia.  Resolved    Latest Ref Rng & Units 02/05/2023   11:12 AM 01/22/2023    6:36 AM 12/20/2022   10:54 AM  CBC  WBC 4.0 -  10.5 K/uL 8.5  8.1  5.6   Hemoglobin 12.0 - 15.0 g/dL 96.2  95.2  84.1   Hematocrit 36.0 - 46.0 % 46.7  47.5  42.8   Platelets 150 - 400 K/uL 284  274  253      9.  History of drug abuse.  Positive cocaine on urine drug screen.  Provide counseling  10.  AKI/hypovolemia and ATN.  Resolved Appears to have fair fluid intake, creatinine has been stable  11/3- Push fluids due ot low BP    Latest Ref Rng & Units 03/23/2023    5:11 AM 03/16/2023    6:53 AM 03/09/2023    6:13 AM  BMP  Creatinine 0.44 - 1.00 mg/dL 3.24  4.01  0.27     11.  Mild transaminitis with rhabdomyolysis.  Both resolved  12.  Hypotension. Resolved, d/c midodrine  -BP stable  11/2- BP was running 87 systolic last night- and max of 110 in last 24 hours- not dizzy per chart, but if develops any dizziness, suggest primary team restarts Midodrine. Amantadine can cause hypotension, FYI  11/3- No dizziness- BP on low side- will push fluids, but might restart Midodrine. If becomes yellow MEWS again, will restart midodrine today- d/w nursing  13. Impaired initiation: continue amantadine 100mg  BID  14. Hyperglycemia: CBGs mildly elevated, d/ced checks. Prosource d/ced as per patient's request  15. Spasticity:   -discussed serial casting with OT and her mom but she defers given pain she had when this was attempted  -baclofen and tizanidine decreased due to hypotension, spasticity has been stable. Continue wrist, elbow brace, and PRAFOs. Ordered a night splint for LLE  Repeat CMP today given chronic tizanidine use  -9/7 continues to have significant tone despite use of tizanidine and baclofen, may be beneficial consider baclofen pump in outpatient setting if this causes significant difficulties in care  9/8 consider decrease baclofen if dyskinetic movements continue  10/5-6  continue orthotics, ROM, positioning  when possible 16. Bowel and bladder incontinence: continue bowel and bladder program  17. Left hand skin  maceration: Eucerin ordered, discussed hand split with OT, continue  18. History of magnesium deficiency: magnesium 500mg  started HS  19. HTN: see above Increase tizanidine to 6mg  TID. Magnesium supplement  added HS. Increase propanolol to 20mg  TID, continue this dose, d/c clonidine patch given hypotension    03/23/2023    5:20 AM 03/22/2023   12:37 PM 03/22/2023    5:24 AM  Vitals with BMI  Systolic 105 103 027  Diastolic 75 76 72  Pulse 84 91 79     20. Area of redness between left thumb and index finger: continue foam dressing  21. Scalp eczema: selsun blue ordered, continue  -9/7 patient's mother feels like this is causing her to itch, will start some hydrocortisone cream  22. Fatigue: improved, discussed with team may be secondary to anti-spasticity medications, B12 reviewed and is 335, in suboptimal range, will give 1,000U B12 injection 8/7, will order monthly as needed, metanx started, increase to BID, continue  23. Right wrist swelling: XR ordered and shows no acute fracture, shows 2.60mm ulnar variance, brace removed to given patient a break from it. Voltaren gel ordered prn for discomfort, continue, CT ordered and show no acute osseus injury, continue ace wrap  23. Dysphagia: continue D3/thins--tolerating this diet  24. MRSA nares positive: negative on repeat, precautions d/ced  25. Tachycardic: resolved, d/c propanolol.  continueTizanidine increased to 6mg  TID. Resolved, amantadine increased back to 100mg  BID   10/28 HR controlled in 80's  11/3- HR controlled- con't regimen 26.  Dyskinetic movements in head and neck - appears to be myoclonic jerks related to hypoxic brain injury - as discussed with pt mom, no specific meds for this on antispasticity meds , consioder benzo although with pt's SUD would avoid this class  EEG reviewed and was negative for seizure.  Decrease baclofen to 15mg  TID  27. Small sore on the base of 5th metatarsal head: local wound care,  continue  28. Rash on face: resolved, discussed with mother could have been secondary to added sugars in nutritional supplements  29. Sacral pressure injury: air mattress ordered, Placed nursing order to offload q1 hour while awake, continue medihoney, wound care consulted, discussed with nursing that this is improving  30. Cervical extensor weakness: tried decreasing tizanidine but spasticity appears increased and BP elevated so will increase back to 6mg  TID, continue this dose, continue cervical collar  31. Low protein stores: d/c prosource since patient hates these and is eating 100% of meals  32. Intermittent lethargy: labs reviewed and are stable, discussed with team may be secondary to anti-spasticity medications  33. Blood blister to left heel: continue foam heel protectors.  34. Suboptimal vitamin D level: increase to 5,000 U daily. continue this dose  35. Hypotension: d/c propanolol, decrease baclofen to 10mg  HS, continue this dose, BP reviewed and well controlled, continue current regimen  26. Foot pain: tramadol ordered prn, improved today, continue prn  27. S/Nicole fall: CT head reviewed and is stable  28. Bladder incontinence: continue bladder program  29. Headaches: continue tylenol and tramadol prn    LOS: 125 days A FACE TO FACE EVALUATION WAS PERFORMED  Nicole [name] Nicole [name] 03/23/2023, 10:03 AM

## 2023-03-23 NOTE — Progress Notes (Signed)
Physical Therapy Session Note  Patient Details  Name: Nicole [name] MRN: [id number] Date of Birth: [birth year]  Today's Date: 03/23/2023 PT Individual Time: 0800-0855 PT Individual Time Calculation (min): 55 min   Short Term Goals: Week 3:  PT Short Term Goal 1 (Week 3): Pt will initiate bed mobility but require no more than totalA PT Short Term Goal 1 - Progress (Week 3): Not met PT Short Term Goal 2 (Week 3): Pt will maintain sitting balance unsupported for 30 seconds PT Short Term Goal 2 - Progress (Week 3): Not met PT Short Term Goal 3 (Week 3): Pt will demonstrate command follow for ~15% of tasks. PT Short Term Goal 3 - Progress (Week 3): Not met  Skilled Therapeutic Interventions/Progress Updates:      Pt in bed to start - reports she's "pissed off" because she can't get the call bell remote to work. She's upset because she can't figure out how to change the channell. With question cues, able to figure it out.   Pt assisted in dressing at bed level - maxA for donning pants - she does assist with pulling them up and bridging her hips. Supine<>sitting EOB with min/modA with hospital bed features. Improved sitting balance with CGA/supervision. Completes UB dressing with modA. Donned tennis shoes with totalA for time. Used Stedy to transfer her from EOB to w/c, modA for standing in stedy and raised EOB height to assist with this.   Once sitting in toilet, pt requesting need to toilet. Used Stedy to transfer onto the toilet - added padding to toilet seat to improve comfort. TotalA for managing pants/brief in standing. Pt needing CGA for sitting on toilet for safety due to L trunk lean. Pt unable to void or have BM despite ++ time, and limited sitting tolerance on toilet due to discomfort. +2 assist for donning clean brief and raising pants in standing using the Stedy. Multiple mini stands completed to pull pants up.   Transported patient in w/c for time management to day room rehab gym.    Patient instructed in therapeutic activity with use of the Corene Cornea to facilitate standing and sustained weight bearing through BLE. Patient participated in shooting basketball (with her RUE) to basketball hoop ~27ft away. Patient purposefully shooting (and celebrating) when making shots. CGA for sitting balance in perched position and minA for standing balance with the use of the Stedy. Tolerated perched position and mini standing for > 10 minutes during this activity.   Patient returned to her room and ended treatment sitting in wheelchair with safety belt alarm on, call bell in lap.    Therapy Documentation Precautions:  Precautions Precautions: Fall Precaution Comments: delayed processing, Increased tone UB and LB Required Braces or Orthoses: Other Brace Other Brace: B adjustable night splints; B elbow "cosey" splints; L palm protector; R mitt Restrictions Weight Bearing Restrictions: No General:      Therapy/Group: Individual Therapy  Orrin Brigham 03/23/2023, 7:42 AM

## 2023-03-23 NOTE — Progress Notes (Signed)
Physical Therapy Session Note  Patient Details  Name: Nicole [name] MRN: [id number] Date of Birth: 01-25-1976  Today's Date: 03/23/2023 PT Individual Time: 1033-1100 PT Individual Time Calculation (min): 27 min    Skilled Therapeutic Interventions/Progress Updates:     Pt received seated in [facility] and agrees to therapy. Reports pain in Lt knee. Number not provided. PT provides rest breaks and repositioning to manage pain. WC transport to gym for time management. Pt completes sit to stand with standing frame to provide loading through BLEs for optimal ROM, allowing lower extremities to position in neutral alignment for extended period of time. Pt able to remain standing for ~10 minutes with cues for hip and trunk extension, as well as use of core muscles to optimize posture. PT utilizes music as distraction from discomfort as pt reports pain in lower extremities during activity, but is able to be redirected.  Pt returns to sitting. Left seated in WC with alarm intact and all needs within reach.  Therapy Documentation Precautions:  Precautions Precautions: Fall Precaution Comments: delayed processing, Increased tone UB and LB Required Braces or Orthoses: Other Brace Other Brace: B adjustable night splints; B elbow "cosey" splints; L palm protector; R mitt Restrictions Weight Bearing Restrictions: No  Therapy/Group: Individual Therapy  Beau Fanny, PT, DPT 03/23/2023, 4:15 PM

## 2023-03-23 NOTE — Plan of Care (Signed)
  Problem: RH BLADDER ELIMINATION Goal: RH STG MANAGE BLADDER WITH ASSISTANCE Description: STG Manage Bladder With toileting Assistance Outcome: Not Progressing; incontinence   

## 2023-03-23 NOTE — Plan of Care (Signed)
  Problem: Consults Goal: RH STROKE PATIENT EDUCATION Description: See Patient Education module for education specifics  Outcome: Progressing   Problem: RH BOWEL ELIMINATION Goal: RH STG MANAGE BOWEL WITH ASSISTANCE Description: STG Manage Bowel with mod I Assistance. Outcome: Progressing Goal: RH STG MANAGE BOWEL W/MEDICATION W/ASSISTANCE Description: STG Manage Bowel with Medication with mod I Assistance. Outcome: Progressing   Problem: RH BLADDER ELIMINATION Goal: RH STG MANAGE BLADDER WITH ASSISTANCE Description: STG Manage Bladder With toileting Assistance Outcome: Progressing   Problem: RH SKIN INTEGRITY Goal: RH STG MAINTAIN SKIN INTEGRITY WITH ASSISTANCE Description: STG Maintain Skin Integrity With min  Assistance. Outcome: Progressing

## 2023-03-23 NOTE — Progress Notes (Signed)
Occupational Therapy Session Note  Patient Details  Name: Nicole [name] MRN: [id number] Date of Birth: Nov 25, 1975  Today's Date: 03/23/2023 OT Individual Time: 6644-0347 OT Individual Time Calculation (min): 26 min    Short Term Goals: Week 9: OT Short Term Goal 1 (Week 9): Patient will change orientation of toothbrush/ hairbrush to accomodate task with min assist after set up OT Short Term Goal 2 (Week 9): Patient will spear food x 3 once utensil in hand, then bring to mouth with only contact assistance  Skilled Therapeutic Interventions/Progress Updates:  Skilled OT intervention completed with focus on toilet transfers and toileting needs. Pt received seated in w/c requesting assist to go to bathroom, agreeable to session. Rt hip and knee pain reported; pre-medicated. OT offered rest breaks, repositioning, and pillow for support with mobility throughout for pain reduction.  Transported dependently in w/c > toilet. Mod A squat pivot > toilet with pt assisting by pulling up with grab bar. Total A to maintain squat position and doff pants down. Added padding on toilet via towels per pt request for comfort. Pt initially trying to get off commode prior to voiding, with gentle encouragement and calming techniques as pt very antsy and anxious appearing. Pt able to have continent urinary void and BM, but OT needed on Lt side due to Lt lateral lean. Pt initiated wiping however needed cues to avoid to prevent spreading of stool into front region.   Without warning and OT readiness, pt impulsively stood via grab bar without assist to off load Rt hip pain, then BLE gave out from underneath pt requiring total A from OT to securely squat pivot pt > w/c. Transported dependently in w/c into room. Utilized pillow at knee region for comfort, then pt able to stand x3 with min/mod A in STEDY while OT completed periarea and donning of LB clothing with total A. Pt unable to remain in stance longer than ~15 secs  therefore required several trials.   Pt remained seated in w/c, with belt alarm on/activated, and with all needs in reach at end of session.   Therapy Documentation Precautions:  Precautions Precautions: Fall Precaution Comments: delayed processing, Increased tone UB and LB Required Braces or Orthoses: Other Brace Other Brace: B adjustable night splints; B elbow "cosey" splints; L palm protector; R mitt Restrictions Weight Bearing Restrictions: No    Therapy/Group: Individual Therapy  Melvyn Novas, MS, OTR/L  03/23/2023, 10:07 AM

## 2023-03-23 NOTE — Progress Notes (Incomplete)
Occupational Therapy Session Note  Patient Details  Name: Nicole [name] MRN: [id number] Date of Birth: 1976/03/26  {CHL IP REHAB OT TIME CALCULATIONS:[id number]}   Short Term Goals: Week 6:  OT Short Term Goal 1 (Week 6): Patient will indicate need to void an dassist with trasnfer with max assist OT Short Term Goal 1 - Progress (Week 6): Other (comment) OT Short Term Goal 2 (Week 6): Patient will utilize left hand x 3 to aide with bimaual tasks with min cueing OT Short Term Goal 2 - Progress (Week 6): Other (comment) OT Short Term Goal 3 (Week 6): Patient will pull self to partial standing with mod assist OT Short Term Goal 3 - Progress (Week 6): Other (comment)  Skilled Therapeutic Interventions/Progress Updates:  Pt greeted *** for skilled OT session with focus on ***.   Pain: Pt reported ***/10 pain, stating "***" in reference to ***. OT offering intermediate rest breaks and positioning suggestions throughout session to address pain/fatigue and maximize participation/safety in session.   Functional Transfers:  Self Care Tasks:  Therapeutic Activities:  Therapeutic Exercise:   Education:  Pt remained *** with 4Ps assessed and immediate needs met. Pt continues to be appropriate for skilled OT intervention to promote further functional independence in ADLs/IADLs.    Therapy Documentation Precautions:  Precautions Precautions: Fall Precaution Comments: delayed processing, Increased tone UB and LB Required Braces or Orthoses: Other Brace Other Brace: B adjustable night splints; B elbow "cosey" splints; L palm protector; R mitt Restrictions Weight Bearing Restrictions: No   Therapy/Group: Individual Therapy  Lou Cal, OTR/L, MSOT  03/23/2023, 6:45 PM

## 2023-03-23 NOTE — Progress Notes (Addendum)
Patient ID: Nicole [name], female   DOB: 1975/12/28, 47 y.o.   MRN: [id number]  When reached out to [facility] and [facility] to ask the issues with accepting and/or update. Both report when go into [facility] she has [employer] and they don not accept this. Have reached out to [facility] [employer] to ask again it approved for long term care medicaid and reason facilities can not get this information. She had no answer for this and asked of she could go into [facility] she does not do this. She does not know what to do and reports [employer] manages the medicaid now. Will ask facilities to call natasha to verify medicaid and asked her to ask her supervisor regarding this situation. Will reach out to [facility] and she if she has heard of this.  1;24 PM [facility] came to visit pt and spoke with Mom. She sent message later they can not meet pt's needs and declined her. Encouraged Mom to speak with [facility] administrator-Amy today when goes in for work. She has not returned this worker's call from yesterday. Have contacted  [facility] to get answer regarding when business office of facilities check to see if coverage for pt in facility.

## 2023-03-23 NOTE — Progress Notes (Signed)
Occupational Therapy Session Note  Patient Details  Name: Nicole [name] MRN: [id number] Date of Birth: [birth year]  Today's Date: 03/23/2023 OT Individual Time: 4098-1191 OT Individual Time Calculation (min): 50 min    Short Term Goals: Week 9: OT Short Term Goal 1 (Week 9): Patient will change orientation of toothbrush/ hairbrush to accomodate task with min assist after set up OT Short Term Goal 2 (Week 9): Patient will spear food x 3 once utensil in hand, then bring to mouth with only contact assistance  Skilled Therapeutic Interventions/Progress Updates:    1:1 Pt received in the w/c. Pt reported she felt confined and needed to move around. Pt agreeable to trying some hard things like standing. Taken to the dayroom and transferred to the mat with max A squat pivot with difficulty with laterally turning. Sitting on the mat with close supervision with mod cues to maintain forward weight shift for feet to maintain contact with the floor. Lite Gait harness applied. Pt able to pull into to squat pivot with min A. Pt lifted to unweight bodyweight to help with pain and advancing LEs. Pt begin to ambulate forward with reciprocal stepping with mod cues and facilitation for pelvis position to neutral and for left hip abduction to neutral to avoid scissoring. Pt ambulated ~60 feet (mat to the SLp room) and then taking a seated rest break and then walking again to the elevators another ~75 feet. Pt able to get full right knee and hip extension with heel on the floor but on the right flexed knee and hip with toe walking on the right foot (heel cord tight).  While heat was applied to knees - task given of typing name and copying a sentence into a word document. Pt very impulsive with typing making frequent mistakes requiring max A to slow down and take her time to cut down on errors. Pt required mod A to attend to right side of keyboard. Printed and pt signed and placed in her room.   Pt with difficulty  transferring to bed distracted but needing to urinate. By the time we transferred to the [facility] she already voided and only had a little more to void. Total A +2 for toileting and transfer back to the bed. Mod A to return to supine and max A with bridging to don clean pants.   Pt left resting in the bed.   Therapy Documentation Precautions:  Precautions Precautions: Fall Precaution Comments: delayed processing, Increased tone UB and LB Required Braces or Orthoses: Other Brace Other Brace: B adjustable night splints; B elbow "cosey" splints; L palm protector; R mitt Restrictions Weight Bearing Restrictions: No General:   Vital Signs: Therapy Vitals Temp: 97.6 F (36.4 C) Pulse Rate: 88 Resp: 16 BP: 100/72 Patient Position (if appropriate): Sitting Oxygen Therapy SpO2: 100 % O2 Device: Room Air Pain: Pain Assessment Pain Scale: 0-10 Pain Score: 8  Pain Type: Acute pain Pain Location: Back Pain Descriptors / Indicators: Aching Pain Frequency: Intermittent Pain Onset: On-going Pain Intervention(s): Medication (See eMAR) left knee pain- applie k pad to knees after walking and pt reported she like them - also asked for meds for knees   Therapy/Group: Individual Therapy  Roney Mans [facility] 03/23/2023, 3:21 PM

## 2023-03-24 NOTE — Progress Notes (Signed)
Occupational Therapy Session Note  Patient Details  Name: DIANEY BELARDE MRN: [id number] Date of Birth: 28-Aug-1975  {CHL IP REHAB OT TIME CALCULATIONS:[id number]}   Short Term Goals: Week 17:  OT Short Term Goal 1: Patient will indicate need to void an dassist with trasnfer with max assist OT Short Term Goal 2  Patient will utilize left hand x 3 to aide with bimaual tasks with min cueing OT Short Term Goal 3  Patient will pull self to partial standing with mod assist   Skilled Therapeutic Interventions/Progress Updates:    Patient agreeable to participate in OT session. Reports *** pain level.   Patient participated in skilled OT session focusing on ***. Therapist facilitated/assessed/developed/educated/integrated/elicited *** in order to improve/facilitate/promote    Therapy Documentation Precautions:  Precautions Precautions: Fall Precaution Comments: delayed processing, Increased tone UB and LB Required Braces or Orthoses: Other Brace Other Brace: B adjustable night splints; B elbow "cosey" splints; L palm protector; R mitt Restrictions Weight Bearing Restrictions: No   Therapy/Group: Individual Therapy  Limmie Patricia, OTR/L,CBIS  Supplemental OT - [facility] and [facility] Secure Chat Preferred   03/24/2023, 6:01 PM

## 2023-03-24 NOTE — Progress Notes (Addendum)
Physical Therapy Session Note  Patient Details  Name: MONTA ZISMAN MRN: [id number] Date of Birth: [birth year]  Today's Date: 03/24/2023 PT Individual Time: 0803-0902 PT Individual Time Calculation (min): 59 min   Short Term Goals: Week 9: PT Short Term Goal 1 (Week 9): Pt will initiate bed mobility with totalA PT Short Term Goal 2 (Week 9): Pt will maintain unsupported sitting balance for 30 seconds without LOB PT Short Term Goal 3 (Week 9): Pt will complete bed<>chair transfers with no more than totalA  Skilled Therapeutic Interventions/Progress Updates:  Patient supine in bed on entrance to room. Patient alert and agreeable to PT session.   Patient with no pain complaint at start of session.  Therapeutic Activity: Bed Mobility: in supine, Pt assisted in dressing at bed level - maxA for donning pants - she performs figure 4 position to thread feet into pants with supervision for LLE and Min/ ModA with RLE.  Pulls pants up and bridging her hips.     Pt performed supine > sit with overall MinA d/t pt requiring HHA to complete. Improved sitting balance with CGA/supervision. Completes UB dressing with ModA as she initially tries to don new shirt over already donned shirt. Donned tennis shoes with totalA for time. Demos ability to scoot forward after stating that she can't scoot. VC/ tc required for technique.   Transfers: Pt performed squat pivot transfers throughout session with modA once setup with UB and UE positioning. Assisted pt in maintaining LB positioning. Provided verbal cues for sequencing movement and technique.  In Litegait, pt performs sit<>stand using BUE hand grips on rails with CGA. (Impulsively)  Gait Training:  Pt setup with donning of Litegait harness and MaxA. Is able to grab onto hand grips of Litegait to lift in order to buckle leg straps.   Then ambulated 27' x1/ 60' x1 using bodyweight support harness and reciprocating steps with supervision. Demonstrated  intermittent ability to rise up to straighter LE over RLE but LLE  maintained in knee flexion and decreased hip extension d/t tone/ contractures and pain with use. Provided vc/ tc for more forward pelvic translation with pelvis positioned under shoulders for increased stretch, LE use, motor control and motor planning.   Pt performs squat pivot back to Bed with ModA and setup of body for assist. Sit>supine with MinA and vc for technique.   Patient supine in bed at end of session with brakes locked, bed alarm set, and all needs within reach.   Therapy Documentation Precautions:  Precautions Precautions: Fall Precaution Comments: delayed processing, Increased tone UB and LB Required Braces or Orthoses: Other Brace Other Brace: B adjustable night splints; B elbow "cosey" splints; L palm protector; R mitt Restrictions Weight Bearing Restrictions: No  Pain:  Chronic pain related when reaching end ranges of movements d/t contractures/ increased tone. Addressed with repositioning.   Therapy/Group: Individual Therapy  Loel Dubonnet PT, DPT, CSRS 03/24/2023, 7:49 AM

## 2023-03-24 NOTE — Progress Notes (Signed)
Speech Language Pathology Daily Session Note  Patient Details  Name: Nicole [name] MRN: [id number] Date of Birth: Mar 10, 1976  Today's Date: 03/24/2023 SLP Individual Time: 1030-1100 SLP Individual Time Calculation (min): 30 min  Short Term Goals: Week 9: SLP Short Term Goal 1 (Week 9): Week 18 - Pt will solve basic to mildly complex problems with 80% accuracy given min assist. SLP Short Term Goal 2 (Week 9): Week 18 -Patient will utilize external aids for orientation to place and time with supervision level verbal cues SLP Short Term Goal 3 (Week 9): Week 18 - Pt will demonstrate sustained attention to basic tasks for 15 minutes with Mod A multimodal cues for redirection. SLP Short Term Goal 4 (Week 9): Week 18 - Pt will utilize increased vocal intensity w/ MinA verbal cues to remain 90% intelligibile at sentence level  Skilled Therapeutic Interventions: Skilled therapy session focused on cognitive goals. SLP provided maxA during visual construction problem solving task. Patient was provided with four single colored tiles and instructed to re-create design in picture. Patient with significant difficulty requiring tactile, visual and verbal cues to complete. Of note, patient with increased vocal quality this date with approximately 90% intelligibility at the phrase level. Patient left in bed with alarm set and call bell in reach. Continue POC.    Pain Pain Assessment Pain Scale: Faces Faces Pain Scale: Hurts even more Pain Type: Chronic pain Pain Location: Knee Pain Orientation: Left Pain Descriptors / Indicators: Discomfort Pain Frequency: Intermittent Pain Onset: On-going Pain Intervention(s): Medication (See eMAR)  Therapy/Group: Individual Therapy  Asher Torpey M.A., CF-SLP 03/24/2023, 11:09 AM

## 2023-03-24 NOTE — Progress Notes (Signed)
PROGRESS NOTE   Subjective/Complaints:  Pt doing well, finishing PT. Slept ok, pain controlled, unsure of LBM but one documented 03/23/23. Urinating ok, but still has incontinence. Denies any other complaints or concerns today.    ROS:    Pt denies SOB, abd pain, CP, N/V/C/D, and vision changes  . +bladder incontinence, +migraines- well controlled, +spasticity- stable  Objective:   No results found. No results for input(s): "WBC", "HGB", "HCT", "PLT" in the last 72 hours.     Recent Labs    03/23/23 0511  CREATININE 0.72      Intake/Output Summary (Last 24 hours) at 03/24/2023 1123 Last data filed at 03/23/2023 2300 Gross per 24 hour  Intake 716 ml  Output --  Net 716 ml       Pressure Injury 02/09/23 Heel Left Deep Tissue Pressure Injury - Purple or maroon localized area of discolored intact skin or blood-filled blister due to damage of underlying soft tissue from pressure and/or shear. Black/purple and boggy,  size of a quar (Active)  02/09/23 0543  Location: Heel  Location Orientation: Left  Staging: Deep Tissue Pressure Injury - Purple or maroon localized area of discolored intact skin or blood-filled blister due to damage of underlying soft tissue from pressure and/or shear.  Wound Description (Comments): Black/purple and boggy,  size of a quarter.  Foam dressing applied  Present on Admission: No    Physical Exam: Vital Signs Blood pressure 116/81, pulse 78, temperature (!) 97.5 F (36.4 C), temperature source Oral, resp. rate 18, height 5\' 1"  (1.549 m), weight 48.8 kg, SpO2 100%.     General: awake, alert, sitting up in bed- poor initiation, finishing with PT; NAD HENT: conjugate gaze; oropharynx moist CV: regular rate and rhythm; no JVD Pulmonary: CTA B/L; no W/R/R- good air movement GI: soft, NT, ND, (+)BS Psychiatric: engaging, interactive, impulsive at times Neurological: delayed responses-  sometimes difficult to understand  PRIOR EXAMS: Skin: + Scaling dried skin on bilateral heels  - 2 small excoriations, sore on the base of the 5th metatarsal head -no longer present - rash on face - no longer present - stage 2 to right toe -no longer present  + area of redness between left thumb and index finger-now minimal, purple bruise left buttock unstageable - not examined +redness to bilateral elbows -not appreciated + Left heel stage III DTI, covered in foam dressing  Neurologic: AAOx2,  to year but not month or date. Follows most simple commands.  + Hypophonation, speech intelligability much improved  Prior exams: MAS 1 right elbow,MAS 1 in RIght finger and wrist flexors  MAS 3 knee flexors, and MAS 3 L>R  hip adductors  MAS 3 L elbow , MAS 3-4 in L wrist and finger flexors  + LUE WHO--- Hypersensitivity to left hand improved, +left hand contracture  Motor 0/5 strength in LUE and LLE, showing volitional movements in right lower extremity but difficulty to obtain MMT due to her pain/braces, able to do upper body dressing now! Exam stable 11/8  Twice daily for right upper extremity 1/5 finger flexion, otherwise no volitional movement of left upper extremity or bilateral lowers + BL UE WHO.  Musculoskeletal: reduced bilateral shoulder and elbow ROM due to increased tone , pain with passive knee extension bilaterally   . GU: bladder incontinence  Assessment/Plan: 1. Functional deficits which require 3+ hours per day of interdisciplinary therapy in a comprehensive inpatient rehab setting. Physiatrist is providing close team supervision and 24 hour management of active medical problems listed below. Physiatrist and rehab team continue to assess barriers to discharge/monitor patient progress toward functional and medical goals  Care Tool:  Bathing    Body parts bathed by patient: Face, Abdomen, Chest, Left upper leg, Right upper leg, Left arm, Right lower leg, Left lower  leg   Body parts bathed by helper: Right arm, Left arm, Front perineal area, Buttocks, Left lower leg, Right lower leg Body parts n/a: Right arm, Left arm, Front perineal area, Buttocks, Right upper leg, Left upper leg, Right lower leg, Left lower leg   Bathing assist Assist Level: Moderate Assistance - Patient 50 - 74%     Upper Body Dressing/Undressing Upper body dressing   What is the patient wearing?: Pull over shirt    Upper body assist Assist Level: Minimal Assistance - Patient > 75%    Lower Body Dressing/Undressing Lower body dressing      What is the patient wearing?: Pants, Incontinence brief     Lower body assist Assist for lower body dressing: Maximal Assistance - Patient 25 - 49%     Toileting Toileting Toileting Activity did not occur (Clothing management and hygiene only): N/A (no void or bm)  Toileting assist Assist for toileting: Maximal Assistance - Patient 25 - 49%     Transfers Chair/bed transfer  Transfers assist  Chair/bed transfer activity did not occur: Safety/medical concerns  Chair/bed transfer assist level: Total Assistance - Patient < 25%     Locomotion Ambulation   Ambulation assist   Ambulation activity did not occur: Safety/medical concerns  Assist level: 2 helpers   Max distance: 65ft   Walk 10 feet activity   Assist  Walk 10 feet activity did not occur: Safety/medical concerns  Assist level: 2 helpers     Walk 50 feet activity   Assist Walk 50 feet with 2 turns activity did not occur: Safety/medical concerns         Walk 150 feet activity   Assist Walk 150 feet activity did not occur: Safety/medical concerns         Walk 10 feet on uneven surface  activity   Assist Walk 10 feet on uneven surfaces activity did not occur: Safety/medical concerns         Wheelchair     Assist Is the patient using a wheelchair?: Yes Type of Wheelchair: Manual    Wheelchair assist level: Dependent - Patient  0% Max wheelchair distance: 39ft    Wheelchair 50 feet with 2 turns activity    Assist    Wheelchair 50 feet with 2 turns activity did not occur: Safety/medical concerns   Assist Level: Dependent - Patient 0%   Wheelchair 150 feet activity     Assist  Wheelchair 150 feet activity did not occur: Safety/medical concerns   Assist Level: Dependent - Patient 0%   Blood pressure 116/81, pulse 78, temperature (!) 97.5 F (36.4 C), temperature source Oral, resp. rate 18, height 5\' 1"  (1.549 m), weight 48.8 kg, SpO2 100%.  Medical Problem List and Plan: 1. 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  Impaired mobility: continue Lovenox, weekly creatinine ordered             -antiplatelet therapy: N/A  3. Knee pain: Tylenol as needed, continue voltaren gel scheduled.   XR reviewed and is stable, discussed with therapy  4. History of anxiety: discussed with her mom that she feels this was a big risk factor for her accident, propanolol ordered. Continue magnesium supplement  5. Neuropsych/cognition: This patient is not capable of making decisions on her own behalf  - 10/12: Still not capable of medical decision making; BUT oriented and communicating, following directions - MUCH improved .Will add PRN melatonin 3mg  10/20 for insomnia  6. Left buttock unstageable ulcer.  Continue Medihoney to buttock wound daily cover with foam dressing.  Changing dressing every 3 days or as needed soiling   7. Fluids/Electrolytes/Nutrition: Routine in and outs with follow-up chemistries  -Eating  well with cueing  8.   Cavitary right lower lobe pneumonia likely aspiration pneumonia/MRSA pneumonia.  Resolved    Latest Ref Rng & Units 02/05/2023   11:12 AM 01/22/2023    6:36 AM 12/20/2022   10:54 AM  CBC  WBC 4.0 - 10.5 K/uL 8.5  8.1  5.6   Hemoglobin 12.0 - 15.0 g/dL 87.5  64.3  32.9   Hematocrit 36.0 - 46.0 % 46.7  47.5  42.8   Platelets 150 - 400 K/uL 284  274  253      9.  History of drug abuse.  Positive cocaine on urine drug screen.  Provide counseling  10.  AKI/hypovolemia and ATN.  Resolved Appears to have fair fluid intake, creatinine has been stable  11/3- Push fluids due ot low BP  -03/24/23 Cr stable yesterday, BPs stable, monitor    Latest Ref Rng & Units 03/23/2023    5:11 AM 03/16/2023    6:53 AM 03/09/2023    6:13 AM  BMP  Creatinine 0.44 - 1.00 mg/dL 5.18  8.41  6.60     11.  Mild transaminitis with rhabdomyolysis.  Both resolved  12.  Hypotension. Resolved, d/c midodrine  -BP stable  11/2- BP was running 87 systolic last night- and max of 110 in last 24 hours- not dizzy per chart, but if develops any dizziness, suggest primary team restarts Midodrine. Amantadine can cause hypotension, FYI  11/3- No dizziness- BP on low side- will push fluids, but might restart Midodrine. If becomes yellow MEWS again, will restart midodrine today- d/w nursing  -03/24/23 BPs stable, monitor  Vitals:   03/19/23 1335 03/19/23 1953 03/20/23 0340 03/20/23 2042  BP: 100/71 (!) 99/57 99/73 100/71   03/21/23 1536 03/21/23 2025 03/22/23 0524 03/22/23 1237  BP: 97/63 100/68 100/72 103/76   03/23/23 0520 03/23/23 1308 03/23/23 2001 03/24/23 0334  BP: 105/75 100/72 113/83 116/81    13. Impaired initiation: continue amantadine 100mg  BID  14. Hyperglycemia: CBGs mildly elevated, d/ced checks. Prosource d/ced as per patient's request  15. Spasticity:   -discussed serial casting with OT and her mom but she defers given pain she had when this was attempted  -baclofen and tizanidine decreased due to  hypotension, spasticity has been stable. Continue  wrist, elbow brace, and PRAFOs. Ordered a night splint for LLE  Repeat CMP today given chronic tizanidine use  -9/7 continues to have significant tone despite use of tizanidine and baclofen, may be beneficial consider baclofen pump in outpatient setting if this causes significant difficulties in care  9/8 consider decrease baclofen if dyskinetic movements continue  10/5-6  continue orthotics, ROM, positioning  when possible  16. Bowel and bladder incontinence: continue bowel and bladder program  17. Left hand skin maceration: Eucerin ordered, discussed hand split with OT, continue  18. History of magnesium deficiency: magnesium 500mg  started HS  19. HTN: see above Increase tizanidine to 6mg  TID--now 4mg  TID. Magnesium supplement added HS. Increase propanolol to 20mg  TID>> d/c'd, d/c clonidine patch given hypotension    03/24/2023    3:34 AM 03/23/2023    8:01 PM 03/23/2023    1:08 PM  Vitals with BMI  Systolic 116 113 409  Diastolic 81 83 72  Pulse 78 101 88     20. Area of redness between left thumb and index finger: continue foam dressing  21. Scalp eczema: selsun blue ordered, continue  -9/7 patient's mother feels like this is causing her to itch, will start some hydrocortisone cream  22. Fatigue: improved, discussed with team may be secondary to anti-spasticity medications, B12 reviewed and is 335, in suboptimal range, will give 1,000U B12 injection 8/7, will order monthly as needed, metanx started, increase to BID, continue  23. Right wrist swelling: XR ordered and shows no acute fracture, shows 2.67mm ulnar variance, brace removed to given patient a break from it. Voltaren gel ordered prn for discomfort, continue, CT ordered and show no acute osseus injury, continue ace wrap  23. Dysphagia: continue D3/thins--tolerating this diet  24. MRSA nares positive: negative on repeat, precautions d/ced  25. Tachycardic: resolved, d/c  propanolol.  continueTizanidine increased to 6mg  TID. Resolved, amantadine increased back to 100mg  BID   10/28 HR controlled in 80's  -03/24/23- HR controlled- con't regimen  26.  Dyskinetic movements in head and neck - appears to be myoclonic jerks related to hypoxic brain injury - as discussed with pt mom, no specific meds for this on antispasticity meds , consioder benzo although with pt's SUD would avoid this class  EEG reviewed and was negative for seizure.  Decrease baclofen to 15mg  TID  27. Small sore on the base of 5th metatarsal head: local wound care, continue  28. Rash on face: resolved, discussed with mother could have been secondary to added sugars in nutritional supplements  29. Sacral pressure injury: air mattress ordered, Placed nursing order to offload q1 hour while awake, continue medihoney, wound care consulted, discussed with nursing that this is improving  30. Cervical extensor weakness: tried decreasing tizanidine but spasticity appears increased and BP elevated so will increase back to 6mg  TID, continue this dose, continue cervical collar  31. Low protein stores: d/c prosource since patient hates these and is eating 100% of meals  32. Intermittent lethargy: labs reviewed and are stable, discussed with team may be secondary to anti-spasticity medications  33. Blood blister to left heel: continue foam heel protectors.  34. Suboptimal vitamin D level: increase to 5,000 U daily. continue this dose  35. Hypotension: d/c propanolol, decrease baclofen to 10mg  HS, continue this dose, BP reviewed and well controlled, continue current regimen  36. Foot pain: tramadol ordered prn, improved today, continue prn  37. S/p fall: CT head reviewed and is stable  38. Headaches: continue tylenol and  tramadol prn    LOS: 126 days A FACE TO FACE EVALUATION WAS PERFORMED  [address] 03/24/2023, 11:23 AM

## 2023-03-24 NOTE — Progress Notes (Signed)
Restless night. Incontinent of urine x 4 this shift. Left heel with DTI, foam dressing in place and attempt to elevate. Refusing prevalon boots. Barrier cream applied to sacrum. Unable to keep foam dressing in place because of urinary incontinence. Intermittent BLE Spasms observed. Telesitter in place for patient's safety. Alfredo Martinez A

## 2023-03-24 NOTE — Progress Notes (Addendum)
Physical Therapy Session Note  Patient Details  Name: Nicole [name] MRN: [id number] Date of Birth: October 31, 1975  Today's Date: 03/24/2023 PT Individual Time: 1115-1200 PT Individual Time Calculation (min): 45 min   Short Term Goals: Week 9: PT Short Term Goal 1 (Week 9): Pt will initiate bed mobility with totalA PT Short Term Goal 2 (Week 9): Pt will maintain unsupported sitting balance for 30 seconds without LOB PT Short Term Goal 3 (Week 9): Pt will complete bed<>chair transfers with no more than totalA**  Skilled Therapeutic Interventions/Progress Updates:  Pt supine in bed on entrance to room. Pt alert and agreeable to therapy.   Initiates supine>sit using bed features and requests HHA to complete to EOB. Completes MinA. While seated, shoes donned MaxA for time.   Squat pivot transfer to w/c toward R side with Min/ ModA and setup of RUE positioning. Reaches halfway, then requires MinA to complete to middle of w/c seat. Noted wet pants and pt relates that she did soil herself. Agreeable to toilet.   W/c>toilet transfer with modA with pt using safety rail to assist. Sit<>stand with support to pt's L side in order to doff pants with MaxA. Pt actively moving bowels during transfer to toilet and time required to clean area for safe transfers and to improve health and skin integrity. Pt requires x10 sit<>stand for appropriate cleaning as she continues to actively move bowels. During one rise to stand pt actively urinates into toilet.   Pericare provided MaxA, brief donned MaxA and stand pivot back to w/c toward L side with MaxA move of L foot and pt actively stepping RLE toward w/c to assist. Demos increased ability to follow instructions for improved forward flexion at hips and able to scoot back in w/c.   Squat pivot back to bed with MaxA and requiring 2 efforts to reach d/t transfer to L side. Is able to roll to back and pick up BLE to bed surface with MinA d/t increased hip flexor tone. Pt  setup with tray table, call bell for upcoming lunch tray. Bed alarm active and all needs in reach.   Therapy Documentation Precautions:  Precautions Precautions: Fall Precaution Comments: delayed processing, Increased tone UB and LB Required Braces or Orthoses: Other Brace Other Brace: B adjustable night splints; B elbow "cosey" splints; L palm protector; R mitt Restrictions Weight Bearing Restrictions: No  Pain: Chronic pain related when reaching end ranges of movements d/t contractures/ increased tone.    Therapy/Group: Individual Therapy  Loel Dubonnet PT, DPT, CSRS 03/24/2023, 4:49 PM

## 2023-03-25 NOTE — Progress Notes (Signed)
Physical Therapy Session Note  Patient Details  Name: Nicole [name] MRN: [id number] Date of Birth: 1976/04/29  Today's Date: 03/25/2023 PT Individual Time: 4098-1191 PT Individual Time Calculation (min): 41 min   Short Term Goals: Week 9: PT Short Term Goal 1 (Week 9): Pt will initiate bed mobility with totalA PT Short Term Goal 2 (Week 9): Pt will maintain unsupported sitting balance for 30 seconds without LOB PT Short Term Goal 3 (Week 9): Pt will complete bed<>chair transfers with no more than totalA  Skilled Therapeutic Interventions/Progress Updates:      Pt seated in WC upon arrival. Pt agreeable to therapy. Pt denies any pain at rest, reports 8/10 L knee and blister pain, premedicated. Pt provided rest break and repositioning throughout session for pain, and limited weight bearing activity today per pt request 2/2 pain.    Treatment Session focused on dynamic seated balance, challenging limits of stability and improving independence with transfers.  Pt performed Squat pivot transfer with mid-light mod A WC to mat table to R, mat table to WC to L, verbal cues provided for head hip ratio, and positioning leading with buttocks   1x10 anteriolateral trunk lean to L to hand beach ball to tech, 1x10 anterolateral trunk lean to R to grab beach ball from tech, with therapist providing min A for correction of upright posture between each trial.   Pt performed reaching across body with R UE to grab cone off of table (positioned to L of pt) and place on chair positioned to R of pt, intermittent min A provided for anterior LOB.   1x10 horizontal chest press, with min A for maintenance of upright posture as pt demos posterior pelvic telt and forward trunk lean posture, verbal cues provided for chest press with B UE.   Pt required seated rest break in WC 2/2 lumbar fatigue.   Pt self propelled WC ~50 feet with R UE/R LE with min-light mod A, verbal cues provided for technique.   Pt seated in  WC with seatbelt on, telesitter in room and needs within reach.       Therapy Documentation Precautions:  Precautions Precautions: Fall Precaution Comments: delayed processing, Increased tone UB and LB Required Braces or Orthoses: Other Brace Other Brace: B adjustable night splints; B elbow "cosey" splints; L palm protector; R mitt Restrictions Weight Bearing Restrictions: No  Therapy/Group: Individual Therapy  [facility]rose [place], Dresser, DPT  03/25/2023, 7:38 AM

## 2023-03-25 NOTE — Progress Notes (Signed)
PROGRESS NOTE   Subjective/Complaints:  Pt doing well again, just waking up. Slept ok, pain controlled, thinks she had a BM last night but looks like LBM was yesterday during the day unless one wasn't documented. Urinating ok, but still has incontinence. Denies any other complaints or concerns today.    ROS:    Pt denies SOB, abd pain, CP, N/V/C/D, and vision changes  . +bladder incontinence, +migraines- well controlled, +spasticity- stable  Objective:   No results found. No results for input(s): "WBC", "HGB", "HCT", "PLT" in the last 72 hours.     Recent Labs    03/23/23 0511  CREATININE 0.72      Intake/Output Summary (Last 24 hours) at 03/25/2023 0823 Last data filed at 03/25/2023 9563 Gross per 24 hour  Intake 1140 ml  Output --  Net 1140 ml       Pressure Injury 02/09/23 Heel Left Deep Tissue Pressure Injury - Purple or maroon localized area of discolored intact skin or blood-filled blister due to damage of underlying soft tissue from pressure and/or shear. Black/purple and boggy,  size of a quar (Active)  02/09/23 0543  Location: Heel  Location Orientation: Left  Staging: Deep Tissue Pressure Injury - Purple or maroon localized area of discolored intact skin or blood-filled blister due to damage of underlying soft tissue from pressure and/or shear.  Wound Description (Comments): Black/purple and boggy,  size of a quarter.  Foam dressing applied  Present on Admission: No    Physical Exam: Vital Signs Blood pressure 104/80, pulse 79, temperature 98.4 F (36.9 C), temperature source Oral, resp. rate 18, height 5\' 1"  (1.549 m), weight 48.8 kg, SpO2 97%.     General: awake, alert, resting in bed; NAD HENT: conjugate gaze; oropharynx moist, dons glasses independently CV: regular rate and rhythm; no JVD Pulmonary: CTA B/L; no W/R/R- good air movement GI: soft, NT, ND, (+)BS Psychiatric: engaging,  interactive, impulsive at times Neurological: delayed responses- sometimes difficult to understand but repeats clearer  PRIOR EXAMS: Skin: + Scaling dried skin on bilateral heels  - 2 small excoriations, sore on the base of the 5th metatarsal head -no longer present - rash on face - no longer present - stage 2 to right toe -no longer present  + area of redness between left thumb and index finger-now minimal, purple bruise left buttock unstageable - not examined +redness to bilateral elbows -not appreciated + Left heel stage III DTI, covered in foam dressing  Neurologic: AAOx2,  to year but not month or date. Follows most simple commands.  + Hypophonation, speech intelligability much improved  Prior exams: MAS 1 right elbow,MAS 1 in RIght finger and wrist flexors  MAS 3 knee flexors, and MAS 3 L>R  hip adductors  MAS 3 L elbow , MAS 3-4 in L wrist and finger flexors  + LUE WHO--- Hypersensitivity to left hand improved, +left hand contracture  Motor 0/5 strength in LUE and LLE, showing volitional movements in right lower extremity but difficulty to obtain MMT due to her pain/braces, able to do upper body dressing now! Exam stable 11/8  Twice daily for right upper extremity 1/5 finger flexion, otherwise no volitional movement  of left upper extremity or bilateral lowers + BL UE WHO.    Musculoskeletal: reduced bilateral shoulder and elbow ROM due to increased tone , pain with passive knee extension bilaterally   . GU: bladder incontinence  Assessment/Plan: 1. Functional deficits which require 3+ hours per day of interdisciplinary therapy in a comprehensive inpatient rehab setting. Physiatrist is providing close team supervision and 24 hour management of active medical problems listed below. Physiatrist and rehab team continue to assess barriers to discharge/monitor patient progress toward functional and medical goals  Care Tool:  Bathing    Body parts bathed by patient: Face,  Abdomen, Chest, Left upper leg, Right upper leg, Left arm, Right lower leg, Left lower leg   Body parts bathed by helper: Right arm, Left arm, Front perineal area, Buttocks, Left lower leg, Right lower leg Body parts n/a: Right arm, Left arm, Front perineal area, Buttocks, Right upper leg, Left upper leg, Right lower leg, Left lower leg   Bathing assist Assist Level: Moderate Assistance - Patient 50 - 74%     Upper Body Dressing/Undressing Upper body dressing   What is the patient wearing?: Pull over shirt    Upper body assist Assist Level: Minimal Assistance - Patient > 75%    Lower Body Dressing/Undressing Lower body dressing      What is the patient wearing?: Pants, Incontinence brief     Lower body assist Assist for lower body dressing: Maximal Assistance - Patient 25 - 49%     Toileting Toileting Toileting Activity did not occur (Clothing management and hygiene only): N/A (no void or bm)  Toileting assist Assist for toileting: Maximal Assistance - Patient 25 - 49%     Transfers Chair/bed transfer  Transfers assist  Chair/bed transfer activity did not occur: Safety/medical concerns  Chair/bed transfer assist level: Total Assistance - Patient < 25%     Locomotion Ambulation   Ambulation assist   Ambulation activity did not occur: Safety/medical concerns  Assist level: 2 helpers   Max distance: 15ft   Walk 10 feet activity   Assist  Walk 10 feet activity did not occur: Safety/medical concerns  Assist level: 2 helpers     Walk 50 feet activity   Assist Walk 50 feet with 2 turns activity did not occur: Safety/medical concerns         Walk 150 feet activity   Assist Walk 150 feet activity did not occur: Safety/medical concerns         Walk 10 feet on uneven surface  activity   Assist Walk 10 feet on uneven surfaces activity did not occur: Safety/medical concerns         Wheelchair     Assist Is the patient using a  wheelchair?: Yes Type of Wheelchair: Manual    Wheelchair assist level: Dependent - Patient 0% Max wheelchair distance: 77ft    Wheelchair 50 feet with 2 turns activity    Assist    Wheelchair 50 feet with 2 turns activity did not occur: Safety/medical concerns   Assist Level: Dependent - Patient 0%   Wheelchair 150 feet activity     Assist  Wheelchair 150 feet activity did not occur: Safety/medical concerns   Assist Level: Dependent - Patient 0%   Blood pressure 104/80, pulse 79, temperature 98.4 F (36.9 C), temperature source Oral, resp. rate 18, height 5\' 1"  (1.549 m), weight 48.8 kg, SpO2 97%.  Medical Problem List and Plan: 1. 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  Impaired mobility: continue Lovenox, weekly creatinine ordered             -antiplatelet therapy: N/A  3. Knee pain: Tylenol as needed, continue voltaren gel scheduled.   XR reviewed and is stable, discussed with therapy  4. History of anxiety: discussed with her mom that she feels this was a big risk factor for her accident, propanolol ordered. Continue magnesium supplement  5. Neuropsych/cognition: This patient is not capable of making decisions on her own behalf  - 10/12: Still not capable of medical decision making; BUT oriented and communicating, following directions - MUCH improved .Will add PRN melatonin 3mg  10/20 for insomnia  6. Left buttock unstageable ulcer.  Continue Medihoney to buttock wound daily cover with foam dressing.  Changing dressing every 3 days or as needed soiling   7.  Fluids/Electrolytes/Nutrition: Routine in and outs with follow-up chemistries  -Eating  well with cueing  8.  Cavitary right lower lobe pneumonia likely aspiration pneumonia/MRSA pneumonia.  Resolved    Latest Ref Rng & Units 02/05/2023   11:12 AM 01/22/2023    6:36 AM 12/20/2022   10:54 AM  CBC  WBC 4.0 - 10.5 K/uL 8.5  8.1  5.6   Hemoglobin 12.0 - 15.0 g/dL 64.3  32.9  51.8   Hematocrit 36.0 - 46.0 % 46.7  47.5  42.8   Platelets 150 - 400 K/uL 284  274  253      9.  History of drug abuse.  Positive cocaine on urine drug screen.  Provide counseling  10.  AKI/hypovolemia and ATN.  Resolved Appears to have fair fluid intake, creatinine has been stable  11/3- Push fluids due ot low BP  -03/24/23 Cr stable yesterday, BPs stable, monitor    Latest Ref Rng & Units 03/23/2023    5:11 AM 03/16/2023    6:53 AM 03/09/2023    6:13 AM  BMP  Creatinine 0.44 - 1.00 mg/dL 8.41  6.60  6.30     11.  Mild transaminitis with rhabdomyolysis.  Both resolved  12.  Hypotension. Resolved, d/c midodrine  -BP stable  11/2- BP was running 87 systolic last night- and max of 110 in last 24 hours- not dizzy per chart, but if develops any dizziness, suggest primary team restarts Midodrine. Amantadine can cause hypotension, FYI  11/3- No dizziness- BP on low side- will push fluids, but might restart Midodrine. If becomes yellow MEWS again, will restart midodrine today- d/w nursing  -11/9-10/24 BPs stable, monitor  Vitals:   03/20/23 2042 03/21/23 1536 03/21/23 2025 03/22/23 0524  BP: 100/71 97/63 100/68 100/72   03/22/23 1237 03/23/23 0520 03/23/23 1308 03/23/23 2001  BP: 103/76 105/75 100/72 113/83   03/24/23 0334 03/24/23 1454 03/24/23 1949 03/25/23 0358  BP: 116/81 99/73 120/79 104/80    13. Impaired initiation: continue amantadine 100mg  BID  14. Hyperglycemia: CBGs mildly elevated, d/ced checks. Prosource d/ced as per patient's request  15. Spasticity:   -discussed serial casting with OT and her  mom but she defers given pain she had when this was attempted  -  baclofen and tizanidine decreased due to hypotension, spasticity has been stable. Continue wrist, elbow brace, and PRAFOs. Ordered a night splint for LLE  Repeat CMP today given chronic tizanidine use  -9/7 continues to have significant tone despite use of tizanidine and baclofen, may be beneficial consider baclofen pump in outpatient setting if this causes significant difficulties in care  9/8 consider decrease baclofen if dyskinetic movements continue  10/5-6  continue orthotics, ROM, positioning  when possible  16. Bowel and bladder incontinence: continue bowel and bladder program  17. Left hand skin maceration: Eucerin ordered, discussed hand split with OT, continue  18. History of magnesium deficiency: magnesium 500mg  started HS  19. HTN: see above Increase tizanidine to 6mg  TID--now 4mg  TID. Magnesium supplement added HS. Increase propanolol to 20mg  TID>> d/c'd, d/c clonidine patch given hypotension    03/25/2023    3:58 AM 03/24/2023    7:49 PM 03/24/2023    2:54 PM  Vitals with BMI  Systolic 104 120 99  Diastolic 80 79 73  Pulse 79 91 86     20. Area of redness between left thumb and index finger: continue foam dressing  21. Scalp eczema: selsun blue ordered, continue  -9/7 patient's mother feels like this is causing her to itch, will start some hydrocortisone cream  22. Fatigue: improved, discussed with team may be secondary to anti-spasticity medications, B12 reviewed and is 335, in suboptimal range, will give 1,000U B12 injection 8/7, will order monthly as needed, metanx started, increase to BID, continue  23. Right wrist swelling: XR ordered and shows no acute fracture, shows 2.4mm ulnar variance, brace removed to given patient a break from it. Voltaren gel ordered prn for discomfort, continue, CT ordered and show no acute osseus injury, continue ace wrap  23. Dysphagia: continue D3/thins--tolerating this  diet  24. MRSA nares positive: negative on repeat, precautions d/ced  25. Tachycardic: resolved, d/c propanolol.  continueTizanidine increased to 6mg  TID. Resolved, amantadine increased back to 100mg  BID   10/28 HR controlled in 80's  -11/9-10/24- HR controlled- con't regimen  26.  Dyskinetic movements in head and neck - appears to be myoclonic jerks related to hypoxic brain injury - as discussed with pt mom, no specific meds for this on antispasticity meds , consioder benzo although with pt's SUD would avoid this class  EEG reviewed and was negative for seizure.  Decrease baclofen to 15mg  TID>> down to 10mg  QHS  27. Small sore on the base of 5th metatarsal head: local wound care, continue  28. Rash on face: resolved, discussed with mother could have been secondary to added sugars in nutritional supplements  29. Sacral pressure injury: air mattress ordered, Placed nursing order to offload q1 hour while awake, continue medihoney, wound care consulted, discussed with nursing that this is improving  30. Cervical extensor weakness: tried decreasing tizanidine but spasticity appears increased and BP elevated so will increase back to 6mg  TID, continue this dose, continue cervical collar>> now on 4mg  TID  31. Low protein stores: d/c prosource since patient hates these and is eating 100% of meals  32. Intermittent lethargy: labs reviewed and are stable, discussed with team may be secondary to anti-spasticity medications  33. Blood blister to left heel: continue foam heel protectors.  34. Suboptimal vitamin D level: increase to 5,000 U daily. continue this dose  35. Hypotension: d/c propanolol, decrease baclofen to 10mg  HS, continue this dose, BP reviewed and well controlled, continue current regimen  36. Foot pain: tramadol ordered prn,  improved today, continue prn  37. S/p fall: CT head reviewed and is stable  38. Headaches: continue tylenol and tramadol prn    LOS: 127 days A FACE  TO FACE EVALUATION WAS PERFORMED  [address] 03/25/2023, 8:23 AM

## 2023-03-25 NOTE — Progress Notes (Signed)
Occupational Therapy Session Note  Patient Details  Name: Nicole [name] MRN: [id number] Date of Birth: 1976-01-06  Today's Date: 03/25/2023 OT Individual Time: 1300-1345 OT Individual Time Calculation (min): 45 min    Short Term Goals: Week 17:  OT Short Term Goal 1 (Week 6): Patient will indicate need to void an dassist with trasnfer with max assist OT Short Term Goal 1 - Progress (Week 6): Other (comment) OT Short Term Goal 2 (Week 6): Patient will utilize left hand x 3 to aide with bimaual tasks with min cueing OT Short Term Goal 2 - Progress (Week 6): Other (comment) OT Short Term Goal 3 (Week 6): Patient will pull self to partial standing with mod assist OT Short Term Goal 3 - Progress (Week 6): Other (comment)  Skilled Therapeutic Interventions/Progress Updates:      Therapy Documentation Precautions:  Precautions Precautions: Fall Precaution Comments: delayed processing, Increased tone UB and LB Required Braces or Orthoses: Other Brace Other Brace: B adjustable night splints; B elbow "cosey" splints; L palm protector; R mitt Restrictions Weight Bearing Restrictions: No General: "Hello" Pt seated in W/C upon OT arrival, agreeable to OT.  Pain:  6/10 pain reported in gut, activity, intermittent rest breaks, distractions provided for pain management, pt reports tolerable to proceed.   ADL: transfer: squat pivot Max A with posterior lean W/C>bed with VC for hand placement  Exercises: Pt participated in [facility] activities with clothespins, putty and large pegs, pt retrieving items with RUE and LUE for increased dexterity and coordination in order to complete ADLs such as buttoning shirts. Pt completed tasks with PRN rest breaks d/t increased fatigue in LUE. Max VC required for use of LUE for activities.  Pt supine in bed with bed alarm activated, 2 bed rails up, call light within reach and 4Ps assessed.   Therapy/Group: Individual Therapy  Velia Meyer, OTD,  OTR/L 03/25/2023, 4:20 PM

## 2023-03-26 NOTE — Progress Notes (Signed)
PROGRESS NOTE   Subjective/Complaints: No new complaints  Appreciate nursing note- stable overnight but does not like splints Vitals stable   ROS:  Pt denies SOB, abd pain, CP, N/V/C/D, and vision changes  . +bladder incontinence, +migraines- well controlled, +spasticity- stable  Objective:   No results found. No results for input(s): "WBC", "HGB", "HCT", "PLT" in the last 72 hours.     No results for input(s): "NA", "K", "CL", "CO2", "GLUCOSE", "BUN", "CREATININE", "CALCIUM" in the last 72 hours.     Intake/Output Summary (Last 24 hours) at 03/26/2023 0933 Last data filed at 03/26/2023 0741 Gross per 24 hour  Intake 1080 ml  Output --  Net 1080 ml       Pressure Injury 02/09/23 Heel Left Deep Tissue Pressure Injury - Purple or maroon localized area of discolored intact skin or blood-filled blister due to damage of underlying soft tissue from pressure and/or shear. Black/purple and boggy,  size of a quar (Active)  02/09/23 0543  Location: Heel  Location Orientation: Left  Staging: Deep Tissue Pressure Injury - Purple or maroon localized area of discolored intact skin or blood-filled blister due to damage of underlying soft tissue from pressure and/or shear.  Wound Description (Comments): Black/purple and boggy,  size of a quarter.  Foam dressing applied  Present on Admission: No    Physical Exam: Vital Signs Blood pressure 103/82, pulse 89, temperature 98.2 F (36.8 C), temperature source Oral, resp. rate 18, height 5\' 1"  (1.549 m), weight 48.8 kg, SpO2 100%.     General: awake, alert, resting in bed; NAD HENT: conjugate gaze; oropharynx moist, dons glasses independently CV: regular rate and rhythm; no JVD Pulmonary: CTA B/L; no W/R/R- good air movement GI: soft, NT, ND, (+)BS Psychiatric: engaging, interactive, impulsive at times Neurological: delayed responses- sometimes difficult to understand but  repeats clearer  PRIOR EXAMS: Skin: + Scaling dried skin on bilateral heels  - 2 small excoriations, sore on the base of the 5th metatarsal head -no longer present - rash on face - no longer present - stage 2 to right toe -no longer present  + area of redness between left thumb and index finger-now minimal, purple bruise left buttock unstageable - not examined +redness to bilateral elbows -not appreciated + Left heel stage III DTI, covered in foam dressing  Neurologic: AAOx2,  to year but not month or date. Follows most simple commands.  + Hypophonation, speech intelligability much improved  Prior exams: MAS 1 right elbow,MAS 1 in RIght finger and wrist flexors  MAS 3 knee flexors, and MAS 3 L>R  hip adductors  MAS 3 L elbow , MAS 3-4 in L wrist and finger flexors  + LUE WHO--- Hypersensitivity to left hand improved, +left hand contracture  Motor 0/5 strength in LUE and LLE, showing volitional movements in right lower extremity but difficulty to obtain MMT due to her pain/braces, able to do upper body dressing now! Exam stable 11/11  Twice daily for right upper extremity 1/5 finger flexion, otherwise no volitional movement of left upper extremity or bilateral lowers + BL UE WHO.    Musculoskeletal: reduced bilateral shoulder and elbow ROM due to increased tone ,  pain with passive knee extension bilaterally   . GU: bladder incontinence  Assessment/Plan: 1. Functional deficits which require 3+ hours per day of interdisciplinary therapy in a comprehensive inpatient rehab setting. Physiatrist is providing close team supervision and 24 hour management of active medical problems listed below. Physiatrist and rehab team continue to assess barriers to discharge/monitor patient progress toward functional and medical goals  Care Tool:  Bathing    Body parts bathed by patient: Face, Abdomen, Chest, Left upper leg, Right upper leg, Left arm, Right lower leg, Left lower leg   Body parts  bathed by helper: Right arm, Left arm, Front perineal area, Buttocks, Left lower leg, Right lower leg Body parts n/a: Right arm, Left arm, Front perineal area, Buttocks, Right upper leg, Left upper leg, Right lower leg, Left lower leg   Bathing assist Assist Level: Moderate Assistance - Patient 50 - 74%     Upper Body Dressing/Undressing Upper body dressing   What is the patient wearing?: Pull over shirt    Upper body assist Assist Level: Minimal Assistance - Patient > 75%    Lower Body Dressing/Undressing Lower body dressing      What is the patient wearing?: Pants, Incontinence brief     Lower body assist Assist for lower body dressing: Maximal Assistance - Patient 25 - 49%     Toileting Toileting Toileting Activity did not occur (Clothing management and hygiene only): N/A (no void or bm)  Toileting assist Assist for toileting: Maximal Assistance - Patient 25 - 49%     Transfers Chair/bed transfer  Transfers assist  Chair/bed transfer activity did not occur: Safety/medical concerns  Chair/bed transfer assist level: Total Assistance - Patient < 25%     Locomotion Ambulation   Ambulation assist   Ambulation activity did not occur: Safety/medical concerns  Assist level: 2 helpers   Max distance: 75ft   Walk 10 feet activity   Assist  Walk 10 feet activity did not occur: Safety/medical concerns  Assist level: 2 helpers     Walk 50 feet activity   Assist Walk 50 feet with 2 turns activity did not occur: Safety/medical concerns         Walk 150 feet activity   Assist Walk 150 feet activity did not occur: Safety/medical concerns         Walk 10 feet on uneven surface  activity   Assist Walk 10 feet on uneven surfaces activity did not occur: Safety/medical concerns         Wheelchair     Assist Is the patient using a wheelchair?: Yes Type of Wheelchair: Manual    Wheelchair assist level: Dependent - Patient 0% Max wheelchair  distance: 44ft    Wheelchair 50 feet with 2 turns activity    Assist    Wheelchair 50 feet with 2 turns activity did not occur: Safety/medical concerns   Assist Level: Dependent - Patient 0%   Wheelchair 150 feet activity     Assist  Wheelchair 150 feet activity did not occur: Safety/medical concerns   Assist Level: Dependent - Patient 0%   Blood pressure 103/82, pulse 89, temperature 98.2 F (36.8 C), temperature source Oral, resp. rate 18, height 5\' 1"  (1.549 m), weight 48.8 kg, SpO2 100%.  Medical Problem List and Plan: 1. 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  Impaired mobility: continue Lovenox, weekly creatinine ordered             -antiplatelet therapy: N/A  3. Knee pain: Tylenol as needed, continue voltaren gel scheduled.   XR reviewed and is stable, discussed with therapy  4. History of anxiety: discussed with her mom that she feels this was a big risk factor for her accident, propanolol ordered. Continue magnesium supplement  5. Neuropsych/cognition: This patient is not capable of making decisions on her own behalf  - 10/12: Still not capable of medical decision making; BUT oriented and communicating, following directions - MUCH improved .Will add PRN melatonin 3mg  10/20 for insomnia  6. Left buttock unstageable ulcer.  Continue Medihoney to buttock wound daily cover with foam dressing.  Changing dressing every 3 days or as needed soiling   7. Fluids/Electrolytes/Nutrition: Routine in and outs with follow-up chemistries  -Eating  well with cueing  8.  Cavitary right lower  lobe pneumonia likely aspiration pneumonia/MRSA pneumonia.  Resolved    Latest Ref Rng & Units 02/05/2023   11:12 AM 01/22/2023    6:36 AM 12/20/2022   10:54 AM  CBC  WBC 4.0 - 10.5 K/uL 8.5  8.1  5.6   Hemoglobin 12.0 - 15.0 g/dL 40.9  81.1  91.4   Hematocrit 36.0 - 46.0 % 46.7  47.5  42.8   Platelets 150 - 400 K/uL 284  274  253      9.  History of drug abuse.  Positive cocaine on urine drug screen.  Provide counseling  10.  AKI/hypovolemia and ATN.  Resolved Appears to have fair fluid intake, creatinine has been stable  11/3- Push fluids due ot low BP  -03/24/23 Cr stable yesterday, BPs stable, monitor    Latest Ref Rng & Units 03/23/2023    5:11 AM 03/16/2023    6:53 AM 03/09/2023    6:13 AM  BMP  Creatinine 0.44 - 1.00 mg/dL 7.82  9.56  2.13     11.  Mild transaminitis with rhabdomyolysis.  Both resolved  12.  Hypotension. Resolved, d/c midodrine  -BP stable  11/2- BP was running 87 systolic last night- and max of 110 in last 24 hours- not dizzy per chart, but if develops any dizziness, suggest primary team restarts Midodrine. Amantadine can cause hypotension, FYI  11/3- No dizziness- BP on low side- will push fluids, but might restart Midodrine. If becomes yellow MEWS again, will restart midodrine today- d/w nursing  -11/9-10/24 BPs stable, monitor  Vitals:   03/22/23 0524 03/22/23 1237 03/23/23 0520 03/23/23 1308  BP: 100/72 103/76 105/75 100/72   03/23/23 2001 03/24/23 0334 03/24/23 1454 03/24/23 1949  BP: 113/83 116/81 99/73 120/79   03/25/23 0358 03/25/23 1354 03/25/23 1910 03/26/23 0355  BP: 104/80 97/72 (!) 111/98 103/82    13. Impaired initiation: continue amantadine 100mg  BID  14. Hyperglycemia: CBGs mildly elevated, d/ced checks. Prosource d/ced as per patient's request  15. Spasticity:   -discussed serial casting with OT and her mom but she defers given pain she had when this was attempted  -baclofen and tizanidine decreased due to hypotension, spasticity  has been stable. Continue wrist, elbow brace, and PRAFOs. Ordered a night splint for LLE  Repeat CMP today given chronic tizanidine use  -9/7 continues to  have significant tone despite use of tizanidine and baclofen, may be beneficial consider baclofen pump in outpatient setting if this causes significant difficulties in care  9/8 consider decrease baclofen if dyskinetic movements continue  10/5-6  continue orthotics, ROM, positioning  when possible  16. Bowel and bladder incontinence: continue bowel and bladder program  17. Left hand skin maceration: Eucerin ordered, discussed hand split with OT, continue  18. History of magnesium deficiency: magnesium 500mg  started HS  19. HTN: see above Increase tizanidine to 6mg  TID--now 4mg  TID. Magnesium supplement added HS. Increase propanolol to 20mg  TID>> d/c'd, d/c clonidine patch given hypotension    03/26/2023    3:55 AM 03/25/2023    7:10 PM 03/25/2023    1:54 PM  Vitals with BMI  Systolic 103 111 97  Diastolic 82 98 72  Pulse 89 94 90     20. Area of redness between left thumb and index finger: continue foam dressing  21. Scalp eczema: selsun blue ordered, continue  -9/7 patient's mother feels like this is causing her to itch, will start some hydrocortisone cream  22. Fatigue: improved, discussed with team may be secondary to anti-spasticity medications, B12 reviewed and is 335, in suboptimal range, will give 1,000U B12 injection 8/7, will order monthly as needed, metanx started, increase to BID, continue  23. Right wrist swelling: XR ordered and shows no acute fracture, shows 2.46mm ulnar variance, brace removed to given patient a break from it. Voltaren gel ordered prn for discomfort, continue, CT ordered and show no acute osseus injury, continue ace wrap  23. Dysphagia: continue D3/thins--tolerating this diet  24. MRSA nares positive: negative on repeat, precautions d/ced  25. Tachycardic: resolved, d/c propanolol.   continueTizanidine increased to 6mg  TID. Resolved, amantadine increased back to 100mg  BID   10/28 HR controlled in 80's  -11/9-10/24- HR controlled- con't regimen  26.  Dyskinetic movements in head and neck - appears to be myoclonic jerks related to hypoxic brain injury - as discussed with pt mom, no specific meds for this on antispasticity meds , consioder benzo although with pt's SUD would avoid this class  EEG reviewed and was negative for seizure.  Decrease baclofen to 15mg  TID>> down to 10mg  QHS  27. Small sore on the base of 5th metatarsal head: local wound care, continue  28. Rash on face: resolved, discussed with mother could have been secondary to added sugars in nutritional supplements  29. Sacral pressure injury: air mattress ordered, Placed nursing order to offload q1 hour while awake, continue medihoney, wound care consulted, discussed with nursing that this is improving  30. Cervical extensor weakness: tried decreasing tizanidine but spasticity appears increased and BP elevated so will increase back to 6mg  TID, continue this dose, continue cervical collar>> now on 4mg  TID  31. Low protein stores: d/c prosource since patient hates these and is eating 100% of meals  32. Intermittent lethargy: labs reviewed and are stable, discussed with team may be secondary to anti-spasticity medications  33. Blood blister to left heel: continue foam heel protectors.  34. Suboptimal vitamin D level: increase to 5,000 U daily. continue this dose  35. Hypotension: d/c propanolol, decrease baclofen to 10mg  HS, continue this dose, BP reviewed and well controlled, continue current regimen  36. Foot pain: tramadol ordered prn, improved today, continue prn  37. S/p fall: CT head reviewed and is stable  38. Headaches: continue tylenol and tramadol prn    LOS: 128 days A FACE TO FACE EVALUATION WAS PERFORMED  Drema Pry Svea Pusch 03/26/2023, 9:33 AM

## 2023-03-26 NOTE — Progress Notes (Signed)
Physical Therapy Session Note  Patient Details  Name: Nicole [name] MRN: [id number] Date of Birth: [birth year]  Today's Date: 03/26/2023 PT Individual Time: 1330-1415 PT Individual Time Calculation (min): 45 min   Short Term Goals: Week 4:  PT Short Term Goal 1 (Week 4): Pt will initiate bed mobility but require no more than totalA. PT Short Term Goal 1 - Progress (Week 4): Not met PT Short Term Goal 2 (Week 4): Pt will maintain sitting balance unsupported for 30 seconds PT Short Term Goal 2 - Progress (Week 4): Progressing toward goal PT Short Term Goal 3 (Week 4): Pt will demonstrate command follow for ~15% of tasks. PT Short Term Goal 3 - Progress (Week 4): Not met  Skilled Therapeutic Interventions/Progress Updates:      Pt sitting in wheelchair on arrival - no reports of pain. She requests to go back to bed at end of treatment due to fatigue.   Transported to day room rehab gym for time. Focused session on initiating gait training with RW (youth sized).   Gait training completed 75ft + 79ft + 67ft + 102ft + 65ft + 30ft + 34ft (seated rest breaks) with modA and RW with +2 assist for w/c follow for safety. Primary gait deficits include narrow BOS, absent heel strike (progressing to heel strike with cues), crouched gait with hips/knees flexed, and poor foot clearance on L > R foot. Pt gripping front of walker frame with her RUE and has difficulty gripping with her LUE.   ModA stand pivot transfer back to bed - maxA for returning to supine. All needs met at end of treatment, mother present and updated on pt's mobility tolerance.    Therapy Documentation Precautions:  Precautions Precautions: Fall Precaution Comments: delayed processing, Increased tone UB and LB Required Braces or Orthoses: Other Brace Other Brace: B adjustable night splints; B elbow "cosey" splints; L palm protector; R mitt Restrictions Weight Bearing Restrictions: No General:      Therapy/Group: Individual  Therapy  Orrin Brigham 03/26/2023, 7:39 AM

## 2023-03-26 NOTE — Plan of Care (Signed)
Goals upgraded to minA due to progress   Problem: RH Balance Goal: LTG Patient will maintain dynamic sitting balance (PT) Description: LTG:  Patient will maintain dynamic sitting balance with assistance during mobility activities (PT) Flowsheets (Taken 03/26/2023 1445) LTG: Pt will maintain dynamic sitting balance during mobility activities with:: Minimal Assistance - Patient > 75% Goal: LTG Patient will maintain dynamic standing balance (PT) Description: LTG:  Patient will maintain dynamic standing balance with assistance during mobility activities (PT) Flowsheets (Taken 03/26/2023 1445) LTG: Pt will maintain dynamic standing balance during mobility activities with:: Minimal Assistance - Patient > 75%   Problem: Sit to Stand Goal: LTG:  Patient will perform sit to stand with assistance level (PT) Description: LTG:  Patient will perform sit to stand with assistance level (PT) Flowsheets (Taken 03/26/2023 1445) LTG: PT will perform sit to stand in preparation for functional mobility with assistance level: Minimal Assistance - Patient > 75%   Problem: RH Bed Mobility Goal: LTG Patient will perform bed mobility with assist (PT) Description: LTG: Patient will perform bed mobility with assistance, with/without cues (PT). Flowsheets (Taken 03/26/2023 1445) LTG: Pt will perform bed mobility with assistance level of: Minimal Assistance - Patient > 75%   Problem: RH Bed to Chair Transfers Goal: LTG Patient will perform bed/chair transfers w/assist (PT) Description: LTG: Patient will perform bed to chair transfers with assistance (PT). Flowsheets (Taken 03/26/2023 1445) LTG: Pt will perform Bed to Chair Transfers with assistance level: Minimal Assistance - Patient > 75%

## 2023-03-26 NOTE — Progress Notes (Addendum)
Patient ID: Nicole [name], female   DOB: [birth year], 47 y.o.   MRN: [id number] Have reached out to [facility] regarding update made aware [place] is still the listed insurance although she does have active long term care medicaid. Gave her Medicaid worker Nicholas Lose [phone] to call to verify. Reached out again to [place] with [facility] and [facility] to ask for update. Rehab Director-Beth has been made aware and reached out to Aashka to assist with this process.

## 2023-03-26 NOTE — Plan of Care (Signed)
Gait goal added due to progress  Problem: RH Ambulation Goal: LTG Patient will ambulate in controlled environment (PT) Description: LTG: Patient will ambulate in a controlled environment, # of feet with assistance (PT). 03/26/2023 1445 by Oda Kilts, Makaylah Oddo P, PT Flowsheets (Taken 03/26/2023 1445) LTG: Pt will ambulate in controlled environ  assist needed:: Minimal Assistance - Patient > 75% LTG: Ambulation distance in controlled environment: 76ft 03/26/2023 1445 by Wynelle Link P, PT Reactivated

## 2023-03-26 NOTE — Progress Notes (Signed)
Occupational Therapy Session Note  Patient Details  Name: Nicole [name] MRN: [id number] Date of Birth: [birth year]  Today's Date: 03/26/2023 OT Individual Time: 1107-1200 OT Individual Time Calculation (min): 53 min    Short Term Goals: Week 9: OT Short Term Goal 1 (Week 9): Patient will change orientation of toothbrush/ hairbrush to accomodate task with min assist after set up OT Short Term Goal 2 (Week 9): Patient will spear food x 3 once utensil in hand, then bring to mouth with only contact assistance  Skilled Therapeutic Interventions/Progress Updates:  Skilled OT intervention completed with focus on toileting positioning/education, ADL retraining, and w/c mobility. Pt received semi upright in bed, agreeable to session. Intermittent discomfort in LLE heel cord; pre-medicated. OT offered tennis shoes for stability, repositioning and modifications throughout for pain reduction.  Pt voiced urge to void. NT present and assisted pt into STEDY while OT retrieved padded tub bench for increased comfort while sitting during toileting due to ++ time needed for continent voiding as pt with significant impulsivity and leaning on standard toilet in prior sessions.  Dependent transfer in STEDY to padded BSC over toilet. Stood from perched position (with pillow at knees for comfort) with min A. Total A to lower LB clothing. With time, pt able to have continent urinary void though burning/discomfort reported; MD/nursing notified and urine hat placed under toilet seat for future clean catch. Pt able to wipe front region with supervision including retrieving her own toilet paper. Pt reported increased comfort and tolerance with sitting on padded BSC vs standard; safety sheet updated to include padded seat details to reflect safest/more functional toileting position for pt.   Stood with mod A using STEDY, total A for donning brief over hips. Dependent transfer in STEDY > w/c. With cues for hemi technique and  some cessation cues to reduce impulsive/fidgety movement to allow proper sequencing, pt was able to doff shirt from behind head with min A, donn new shirt with min A. Total A threading of pants, stood with min A in STEDY and pants over hips and donned shoes with total A. Set up A for brushing hair.  Pt self-propelled in w/c about 75 ft x2 to promote independence with mobility and for hamstring strength, while using RLE with min A for navigation and min cues for heel strike for efficiency with propulsion. Pt with difficulty navigating turns.   Pt remained in w/c in room, with belt alarm and telesitter on/activated, and with all needs in reach at end of session.   Therapy Documentation Precautions:  Precautions Precautions: Fall Precaution Comments: delayed processing, Increased tone UB and LB Required Braces or Orthoses: Other Brace Other Brace: B adjustable night splints; B elbow "cosey" splints; L palm protector; R mitt Restrictions Weight Bearing Restrictions: No    Therapy/Group: Individual Therapy  Melvyn Novas, MS, OTR/L  03/26/2023, 12:26 PM

## 2023-03-26 NOTE — Progress Notes (Signed)
Restless night. Left elbow splint applied at HS. Attempted to apply splints to BLE's,  patient upset  reporting discomfort. "Take it off." Left heel with foam dressing. PRN ultram given at 2130. Incontinent of B & B. Nicole [name] A

## 2023-03-26 NOTE — Progress Notes (Signed)
Nutrition Follow-up  DOCUMENTATION CODES:   Not applicable  INTERVENTION:   -Continue double protein portions all meals.  -Continue Prosource Plus BID, each supplement provides 100 calories, 15 gm protein.  -Continue MVI/Minerals daily, vitamin C 500 mg daily to support wound healing.    NUTRITION DIAGNOSIS:   Increased nutrient needs related to acute illness, other (see comment), wound healing (prolonged hospitalization, previously severe malnutrition) as evidenced by estimated needs.  -addressed with meals and oral supplements, micronutrient support.  GOAL:   Patient will meet greater than or equal to 90% of their needs  -progressing  MONITOR:   PO intake, Supplement acceptance  REASON FOR ASSESSMENT:   Malnutrition Screening Tool    ASSESSMENT:   47 y.o. 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 PMH includes: anxiety, asthma, depression.  Intakes continue to be recorded 100% most meals plus oral supplements. Patient is answering questions immediately today which is improved from recent visits. Weight updated today by RN, weight has increased 3 kg since weight 03/21/23 and 5.7 kg since 02/17/23.   Medications reviewed and include vitamin C 500 mg daily, cholecalciferol 25 mcg daily, magnesium gluconate 500 mg at bedtime, MVI/Minerals-1 Tab daily.  Labs reviewed.  NUTRITION - FOCUSED PHYSICAL EXAM:  Flowsheet Row Most Recent Value  Orbital Region No depletion  Upper Arm Region No depletion  Thoracic and Lumbar Region No depletion  Buccal Region No depletion  Temple Region Mild depletion  Clavicle Bone Region Mild depletion  Clavicle and Acromion Bone Region Mild depletion  Scapular Bone Region Mild depletion  Dorsal Hand Mild depletion  Patellar Region Mild depletion  Anterior Thigh Region Mild depletion  Posterior Calf Region Mild depletion  Edema (RD Assessment)  None  Hair Reviewed  [hair loss on crown during hospitalization, new growth observed.]  Eyes Reviewed  Mouth Reviewed  Skin Reviewed  Nails Reviewed       Diet Order:   Diet Order             DIET DYS 3 Room service appropriate? Yes; Fluid consistency: Thin  Diet effective now                   EDUCATION NEEDS:   Not appropriate for education at this time  Skin:   Skin Assessment: Skin Integrity Issues: Skin Integrity Issues:: DTI DTI: left heel, dressing intact   Last BM:  11/11, type 4-medium  Height:   Ht Readings from Last 1 Encounters:  11/18/22 5\' 1"  (1.549 m)    Weight:   Wt Readings from Last 1 Encounters:  03/26/23 51.8 kg    Ideal Body Weight:     BMI:  Body mass index is 21.58 kg/m.  Estimated Nutritional Needs:   Kcal:  1600-1800  Protein:  80-100 gm  Fluid:  >/= 1.6 L    [name], RDLD Clinical Dietitian See AMION for contact information

## 2023-03-26 NOTE — Progress Notes (Signed)
Speech Language Pathology Daily Session Note  Patient Details  Name: TANGULA HETMAN MRN: [id number] Date of Birth: [birth year]  Today's Date: 03/26/2023 SLP Individual Time: 0931-1026 SLP Individual Time Calculation (min): 55 min  Short Term Goals: Week 9: SLP Short Term Goal 1 (Week 9): Week 18 - Pt will solve basic to mildly complex problems with 80% accuracy given min assist. SLP Short Term Goal 2 (Week 9): Week 18 -Patient will utilize external aids for orientation to place and time with supervision level verbal cues SLP Short Term Goal 3 (Week 9): Week 18 - Pt will demonstrate sustained attention to basic tasks for 15 minutes with Mod A multimodal cues for redirection. SLP Short Term Goal 4 (Week 9): Week 18 - Pt will utilize increased vocal intensity w/ MinA verbal cues to remain 90% intelligibile at sentence level  Skilled Therapeutic Interventions:   Pt seen for skilled SLP session to address cognitive goals. Pt required consistent moderate verbal cues to maintain attention to various tasks throughout the session. She participated in various cognitive tasks address attention, safety awareness, verbal sequencing, and simple problem solving.  Pt required a moderate amount of multi-modal verbal cues to increase accuracies on problem solving and sequencing tasks. She will continue to benefit from ongoing SLP intervention to maximize independence and reduce caregiver burden.   Pain Pain Assessment Pain Scale: Faces Pain Score: 5  Faces Pain Scale: Hurts even more Pain Location: Hip Pain Orientation: Left Pain Intervention(s): Medication (See eMAR) PAINAD (Pain Assessment in Advanced Dementia) Breathing: normal Negative Vocalization: none Facial Expression: smiling or inexpressive Body Language: relaxed Consolability: no need to console PAINAD Score: 0  Therapy/Group: Individual Therapy  Ellery Plunk 03/26/2023, 10:35 AM

## 2023-03-26 NOTE — NC FL2 (Signed)
[place] MEDICAID FL2 LEVEL OF CARE FORM     IDENTIFICATION  Patient Name: Nicole [name] Birthdate: 11-17-1975 Sex: female Admission Date (Current Location): 11/18/2022  [place] and [place] Number:  [address] Bast [id number] S Facility and Address:  [facility], [address] [place], [place] [address]      Provider Number: [id number]  Attending Physician Name and Address:  Horton Chin, MD  Relative Name and Phone Number:  Quincy Carnes [phone]    Current Level of Care: Other (Comment) (Rehab) Recommended Level of Care: Skilled Nursing Facility Prior Approval Number:    Date Approved/Denied:   PASRR Number: [id number]  Discharge Plan: SNF    Current Diagnoses: Patient Active Problem List   Diagnosis Date Noted   Essential hypertension 01/29/2023   Pressure injury of skin 01/29/2023   Protein-calorie malnutrition, severe 01/05/2023   Hypoxic brain injury (HCC) 11/18/2022   Cavitary pneumonia 11/05/2022   Substance use 11/05/2022   Acute respiratory failure (HCC) 10/31/2022   Nonallergic rhinitis 08/23/2020   Mild persistent asthma without complication 06/14/2020   Other allergic rhinitis 06/14/2020   Adverse reaction to food, subsequent encounter 06/14/2020   IBS (irritable bowel syndrome) 05/24/2016   Chronic venous insufficiency 05/24/2016   Migraine 05/24/2016   Pulmonary nodules 10/27/2014   Hepatitis C, chronic (HCC)    Depression 10/09/2013   OSA on CPAP - sees Dr. Jenne Campus in ENT 10/09/2013   Allergic rhinitis 05/30/2012   Cough variant asthma 02/08/2012    Orientation RESPIRATION BLADDER Height & Weight     Self, Place  Normal Incontinent Weight: 107 lb 9.4 oz (48.8 kg) Height:  5\' 1"  (154.9 cm)  BEHAVIORAL SYMPTOMS/MOOD NEUROLOGICAL BOWEL NUTRITION STATUS      Incontinent (at times tells has to go) Diet (Dys 3 thin liquids)  AMBULATORY STATUS COMMUNICATION OF NEEDS Skin   Total Care Verbally (at times  uses call bell inconsistently) Normal PU Stage 1 Dressing: Daily PU Stage 2 Dressing: Daily                   Personal Care Assistance Level of Assistance  Bathing, Dressing Bathing Assistance: Maximum assistance Feeding assistance: Limited assistance (set up) Dressing Assistance: Maximum assistance Total Care Assistance: Maximum assistance   Functional Limitations Info  Speech, Sight Sight Info: Impaired Hearing Info: Adequate Speech Info: Impaired    SPECIAL CARE FACTORS FREQUENCY  Bowel and bladder program     PT Frequency: 2-3 x week OT Frequency: 2-3 x week Bowel and Bladder Program Frequency: Timed tolieting 3-4 hours   Speech Therapy Frequency: 2x week      Contractures Contractures Info: Not present    Additional Factors Info  Code Status, Allergies Code Status Info: DNR Allergies Info: [place] Nuts, Cashews nuts, Ceclor Nickel     Isolation Precautions Info: Off MRSA precautions     Current Medications (03/26/2023):  This is the current hospital active medication list Current Facility-Administered Medications  Medication Dose Route Frequency Provider Last Rate Last Admin   acetaminophen (TYLENOL) tablet 650 mg  650 mg Oral Q6H PRN Charlton Amor, PA-C   650 mg at 03/23/23 1354   amantadine (SYMMETREL) 50 MG/5ML solution 100 mg  100 mg Oral BID Horton Chin, MD   100 mg at 03/26/23 0746   ascorbic acid (VITAMIN C) tablet 500 mg  500 mg Oral Daily Raulkar, Drema Pry, MD   500 mg at 03/26/23 0745   baclofen (LIORESAL) tablet 10 mg  10  mg Oral QHS Raulkar, Drema Pry, MD   10 mg at 03/25/23 2127   camphor-menthol (SARNA) lotion   Topical PRN Horton Chin, MD   Given at 03/24/23 2207   cholecalciferol (VITAMIN D3) 25 MCG (1000 UNIT) tablet 5,000 Units  5,000 Units Oral Daily Raulkar, Drema Pry, MD   5,000 Units at 03/26/23 0745   diclofenac Sodium (VOLTAREN) 1 % topical gel 2 g  2 g Topical QID Raulkar, Drema Pry, MD   2 g at 03/26/23 0746    enoxaparin (LOVENOX) injection 30 mg  30 mg Subcutaneous Q24H Pham, Minh Q, RPH-CPP   30 mg at 03/25/23 1802   gadobutrol (GADAVIST) 1 MMOL/ML injection 5 mL  5 mL Intravenous Once PRN Raulkar, Drema Pry, MD       Gerhardt's butt cream   Topical BID Jacquelynn Cree, PA-C   Given at 03/26/23 4742   hydrocerin (EUCERIN) cream   Topical BID Horton Chin, MD   Given at 03/26/23 0746   l-methylfolate-B6-B12 (METANX) 3-35-2 MG per tablet 1 tablet  1 tablet Oral BID Horton Chin, MD   1 tablet at 03/26/23 0745   magnesium gluconate (MAGONATE) tablet 500 mg  500 mg Oral QHS Raulkar, Drema Pry, MD   500 mg at 03/25/23 2127   melatonin tablet 3 mg  3 mg Oral QHS PRN Fanny Dance, MD   3 mg at 03/17/23 2218   multivitamin with minerals tablet 1 tablet  1 tablet Oral Daily Charlton Amor, PA-C   1 tablet at 03/26/23 0745   selenium sulfide (SELSUN) 1 % shampoo   Topical PRN Horton Chin, MD   Given at 01/17/23 2056   tiZANidine (ZANAFLEX) tablet 4 mg  4 mg Oral TID Horton Chin, MD   4 mg at 03/26/23 0745   traMADol (ULTRAM) tablet 50 mg  50 mg Oral Q6H PRN Horton Chin, MD   50 mg at 03/26/23 5956     Discharge Medications: Please see discharge summary for a list of discharge medications.  Relevant Imaging Results:  Relevant Lab Results:   Additional Information SSN: [id number] needs restorative nursing  Cavan Bearden, Lemar Livings, LCSW

## 2023-03-27 NOTE — Progress Notes (Signed)
Speech Language Pathology Daily Session Note  Patient Details  Name: Nicole [name] MRN: [id number] Date of Birth: 26-Jun-1975  Today's Date: 03/27/2023 SLP Individual Time: 1401-1430 SLP Individual Time Calculation (min): 29 min  Short Term Goals: Week 9: SLP Short Term Goal 1 (Week 9): Week 18 - Pt will solve basic to mildly complex problems with 80% accuracy given min assist. SLP Short Term Goal 2 (Week 9): Week 18 -Patient will utilize external aids for orientation to place and time with supervision level verbal cues SLP Short Term Goal 3 (Week 9): Week 18 - Pt will demonstrate sustained attention to basic tasks for 15 minutes with Mod A multimodal cues for redirection. SLP Short Term Goal 4 (Week 9): Week 18 - Pt will utilize increased vocal intensity w/ MinA verbal cues to remain 90% intelligibile at sentence level  Skilled Therapeutic Interventions: SLP conducted skilled therapy session targeting communication and cognitive goals. Patient requested to work on her "talking" this date, guided patient through various talking tasks including finding synonyms for words. Patient benefited from cues to repeat unintelligible sentences. With min cues, patient 100% intelligible. With no cues given, ~80% intelligible at the sentence level this date. During synonym task, patient benefited from supervision to sustain attention and min assist for remaining oriented to rules of the task. Patient disoriented to month but oriented to date. Patient was left in lowered bed with call bell in reach and bed alarm set. SLP will continue to target goals per plan of care.       Pain Pain Assessment Pain Scale: 0-10 Pain Score: 8  Pain Location: Back  Therapy/Group: Individual Therapy  [name] 03/27/2023, 2:31 PM

## 2023-03-27 NOTE — Progress Notes (Signed)
Physical Therapy Session Note  Patient Details  Name: CLEVA PARKISON MRN: [id number] Date of Birth: 1975/08/25  Today's Date: 03/27/2023 PT Individual Time: 1300-1359 PT Individual Time Calculation (min): 59 min   Short Term Goals: Week 3:  PT Short Term Goal 1 (Week 3): Pt will initiate bed mobility but require no more than totalA PT Short Term Goal 1 - Progress (Week 3): Not met PT Short Term Goal 2 (Week 3): Pt will maintain sitting balance unsupported for 30 seconds PT Short Term Goal 2 - Progress (Week 3): Not met PT Short Term Goal 3 (Week 3): Pt will demonstrate command follow for ~15% of tasks. PT Short Term Goal 3 - Progress (Week 3): Not met  Skilled Therapeutic Interventions/Progress Updates:      Pt in bed finishing up lunch with LPN providing supervision. Pt agreeable to PT tx. No reports of pain.  Donned pants with maxA for time management at bed level. Supine<>sitting EOB with modA for trunk support and BLE management. Squat pivot transfer with mod/maxA into w/c and donned B tennis shoes. Transported to day room rehab gym for time management.  Focused session on initiating AFO consult with [facility]. Instructed patient in gait training ~18ft + 46ft with modA and RW. Primary gait deficits: narrow BOS, flexed trunk/hips, flexed B knees in terminal stance, limited push-off bilaterally.   Determined to allow patient to progress in therapies before completing a custom AFO/bracing - will trial anterior shell bracing for therapy in the interim - difficult with determining best POC without knowing DC date.   Patient then assisted onto mat table for stretching into extension - hips/knee extension stretching, targeting hamstrings, heel cords, and hip flexors. Pt noted to be incontinent of urine during this, so assisted back to her room and completed brief change at bed level. Patient able to bridge to clear buttock from bed to complete brief change.   Pt left with bed flat to  promote stretching into extension. Alarm on, all needs met.   Therapy Documentation Precautions:  Precautions Precautions: Fall Precaution Comments: delayed processing, Increased tone UB and LB Required Braces or Orthoses: Other Brace Other Brace: B adjustable night splints; B elbow "cosey" splints; L palm protector; R mitt Restrictions Weight Bearing Restrictions: No General:     Therapy/Group: Individual Therapy  Orrin Brigham 03/27/2023, 7:43 AM

## 2023-03-27 NOTE — Progress Notes (Signed)
Patient ID: Nicole [name], female   DOB: 1975/11/03, 47 y.o.   MRN: [id number] Spoke with [facility] they have denied pt for admission.

## 2023-03-27 NOTE — Progress Notes (Signed)
PROGRESS NOTE   Subjective/Complaints: No new complaints this morning Commended her for walking with Christian yesterday!! Discussed ordering her bilateral AFOs to help with her ambulation   ROS:  Pt denies SOB, abd pain, CP, N/V/C/D, and vision changes  . +bladder incontinence, +migraines- well controlled, +spasticity- stable, +impaired ambulation  Objective:   No results found. No results for input(s): "WBC", "HGB", "HCT", "PLT" in the last 72 hours.     No results for input(s): "NA", "K", "CL", "CO2", "GLUCOSE", "BUN", "CREATININE", "CALCIUM" in the last 72 hours.     Intake/Output Summary (Last 24 hours) at 03/27/2023 1335 Last data filed at 03/27/2023 0855 Gross per 24 hour  Intake 960 ml  Output --  Net 960 ml       Pressure Injury 02/09/23 Heel Left Deep Tissue Pressure Injury - Purple or maroon localized area of discolored intact skin or blood-filled blister due to damage of underlying soft tissue from pressure and/or shear. Black/purple and boggy,  size of a quar (Active)  02/09/23 0543  Location: Heel  Location Orientation: Left  Staging: Deep Tissue Pressure Injury - Purple or maroon localized area of discolored intact skin or blood-filled blister due to damage of underlying soft tissue from pressure and/or shear.  Wound Description (Comments): Black/purple and boggy,  size of a quarter.  Foam dressing applied  Present on Admission: No    Physical Exam: Vital Signs Blood pressure (!) 103/59, pulse 72, temperature 98.1 F (36.7 C), temperature source Oral, resp. rate 18, height 5\' 1"  (1.549 m), weight 51.8 kg, SpO2 100%.     General: awake, alert, resting in bed; NAD HENT: conjugate gaze; oropharynx moist, dons glasses independently CV: regular rate and rhythm; no JVD Pulmonary: CTA B/L; no W/R/R- good air movement GI: soft, NT, ND, (+)BS Psychiatric: engaging, interactive, impulsive at  times Neurological: delayed responses- sometimes difficult to understand but repeats clearer  PRIOR EXAMS: Skin: + Scaling dried skin on bilateral heels  - 2 small excoriations, sore on the base of the 5th metatarsal head -no longer present - rash on face - no longer present - stage 2 to right toe -no longer present  + area of redness between left thumb and index finger-now minimal, purple bruise left buttock unstageable - not examined +redness to bilateral elbows -not appreciated + Left heel stage III DTI, covered in foam dressing  Neurologic: AAOx2,  to year but not month or date. Follows most simple commands.  + Hypophonation, speech intelligability much improved  Prior exams: MAS 1 right elbow,MAS 1 in RIght finger and wrist flexors  MAS 3 knee flexors, and MAS 3 L>R  hip adductors  MAS 3 L elbow , MAS 3-4 in L wrist and finger flexors  + LUE WHO--- Hypersensitivity to left hand improved, +left hand contracture  Motor 0/5 strength in LUE and LLE, showing volitional movements in right lower extremity but difficulty to obtain MMT due to her pain/braces, able to do upper body dressing now! Ambulated with RW!  Twice daily for right upper extremity 1/5 finger flexion, otherwise no volitional movement of left upper extremity or bilateral lowers + BL UE WHO.    Musculoskeletal: reduced bilateral  shoulder and elbow ROM due to increased tone , pain with passive knee extension bilaterally   . GU: bladder incontinence  Assessment/Plan: 1. Functional deficits which require 3+ hours per day of interdisciplinary therapy in a comprehensive inpatient rehab setting. Physiatrist is providing close team supervision and 24 hour management of active medical problems listed below. Physiatrist and rehab team continue to assess barriers to discharge/monitor patient progress toward functional and medical goals  Care Tool:  Bathing    Body parts bathed by patient: Face, Abdomen, Chest, Left upper  leg, Right upper leg, Left arm, Right lower leg, Left lower leg   Body parts bathed by helper: Right arm, Left arm, Front perineal area, Buttocks, Left lower leg, Right lower leg Body parts n/a: Right arm, Left arm, Front perineal area, Buttocks, Right upper leg, Left upper leg, Right lower leg, Left lower leg   Bathing assist Assist Level: Moderate Assistance - Patient 50 - 74%     Upper Body Dressing/Undressing Upper body dressing   What is the patient wearing?: Pull over shirt    Upper body assist Assist Level: Minimal Assistance - Patient > 75%    Lower Body Dressing/Undressing Lower body dressing      What is the patient wearing?: Pants     Lower body assist Assist for lower body dressing: Maximal Assistance - Patient 25 - 49%     Toileting Toileting Toileting Activity did not occur (Clothing management and hygiene only): N/A (no void or bm)  Toileting assist Assist for toileting: Maximal Assistance - Patient 25 - 49%     Transfers Chair/bed transfer  Transfers assist  Chair/bed transfer activity did not occur: Safety/medical concerns  Chair/bed transfer assist level: Total Assistance - Patient < 25%     Locomotion Ambulation   Ambulation assist   Ambulation activity did not occur: Safety/medical concerns  Assist level: 2 helpers   Max distance: 36ft   Walk 10 feet activity   Assist  Walk 10 feet activity did not occur: Safety/medical concerns  Assist level: 2 helpers     Walk 50 feet activity   Assist Walk 50 feet with 2 turns activity did not occur: Safety/medical concerns         Walk 150 feet activity   Assist Walk 150 feet activity did not occur: Safety/medical concerns         Walk 10 feet on uneven surface  activity   Assist Walk 10 feet on uneven surfaces activity did not occur: Safety/medical concerns         Wheelchair     Assist Is the patient using a wheelchair?: Yes Type of Wheelchair: Manual     Wheelchair assist level: Dependent - Patient 0% Max wheelchair distance: 69ft    Wheelchair 50 feet with 2 turns activity    Assist    Wheelchair 50 feet with 2 turns activity did not occur: Safety/medical concerns   Assist Level: Dependent - Patient 0%   Wheelchair 150 feet activity     Assist  Wheelchair 150 feet activity did not occur: Safety/medical concerns   Assist Level: Dependent - Patient 0%   Blood pressure (!) 103/59, pulse 72, temperature 98.1 F (36.7 C), temperature source Oral, resp. rate 18, height 5\' 1"  (1.549 m), weight 51.8 kg, SpO2 100%.  Medical Problem List and Plan: 1. 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  Impaired mobility: continue Lovenox, weekly creatinine ordered             -antiplatelet therapy: N/A  3. Knee pain: Tylenol as needed, continue voltaren gel scheduled.   XR reviewed and is stable, discussed with therapy  4. History of anxiety: discussed with her mom that she feels this was a big risk factor for her accident, propanolol ordered. Continue magnesium supplement  5. Neuropsych/cognition: This patient is not capable of making decisions on her own behalf  - 10/12: Still not capable of medical decision making; BUT oriented and communicating, following directions - MUCH improved .Will add PRN melatonin 3mg  10/20 for insomnia  6. Left buttock unstageable ulcer.  Continue Medihoney to buttock wound daily cover with foam dressing.  Changing dressing every 3 days or as needed soiling   7. Fluids/Electrolytes/Nutrition: Routine in and outs with  follow-up chemistries  -Eating  well with cueing  8.  Cavitary right lower lobe pneumonia likely aspiration pneumonia/MRSA pneumonia.  Resolved    Latest Ref Rng & Units 02/05/2023   11:12 AM 01/22/2023    6:36 AM 12/20/2022   10:54 AM  CBC  WBC 4.0 - 10.5 K/uL 8.5  8.1  5.6   Hemoglobin 12.0 - 15.0 g/dL 14.7  82.9  56.2   Hematocrit 36.0 - 46.0 % 46.7  47.5  42.8   Platelets 150 - 400 K/uL 284  274  253      9.  History of drug abuse.  Positive cocaine on urine drug screen.  Provide counseling  10.  AKI/hypovolemia and ATN.  Resolved Appears to have fair fluid intake, creatinine has been stable  11/3- Push fluids due ot low BP  -03/24/23 Cr stable yesterday, BPs stable, monitor    Latest Ref Rng & Units 03/23/2023    5:11 AM 03/16/2023    6:53 AM 03/09/2023    6:13 AM  BMP  Creatinine 0.44 - 1.00 mg/dL 1.30  8.65  7.84     11.  Mild transaminitis with rhabdomyolysis.  Both resolved  12.  Hypotension. Resolved, d/c midodrine  -BP stable  11/2- BP was running 87 systolic last night- and max of 110 in last 24 hours- not dizzy per chart, but if develops any dizziness, suggest primary team restarts Midodrine. Amantadine can cause hypotension, FYI  11/3- No dizziness- BP on low side- will push fluids, but might restart Midodrine. If becomes yellow MEWS again, will restart midodrine today- d/w nursing  -11/9-10/24 BPs stable, monitor  Vitals:   03/23/23 2001 03/24/23 0334 03/24/23 1454 03/24/23 1949  BP: 113/83 116/81 99/73 120/79   03/25/23 0358 03/25/23 1354 03/25/23 1910 03/26/23 0355  BP: 104/80 97/72 (!) 111/98 103/82   03/26/23 1304 03/26/23 1631 03/26/23 1940 03/27/23 0451  BP: 112/75 98/64 95/69  (!) 103/59    13. Impaired initiation: continue amantadine 100mg  BID  14. Hyperglycemia: CBGs mildly elevated, d/ced checks. Prosource d/ced as per patient's request  15. Spasticity:   -discussed serial casting with OT and her mom but she defers given pain she had when this was  attempted  -baclofen and tizanidine decreased due to hypotension, spasticity has been stable. Continue wrist, elbow brace, and  PRAFOs. Ordered a night splint for LLE  Repeat CMP today given chronic tizanidine use  -9/7 continues to have significant tone despite use of tizanidine and baclofen, may be beneficial consider baclofen pump in outpatient setting if this causes significant difficulties in care  9/8 consider decrease baclofen if dyskinetic movements continue  10/5-6  continue orthotics, ROM, positioning  when possible  16. Bowel and bladder incontinence: continue bowel and bladder program  17. Left hand skin maceration: Eucerin ordered, discussed hand split with OT, continue  18. History of magnesium deficiency: magnesium 500mg  started HS  19. HTN: see above Increase tizanidine to 6mg  TID--now 4mg  TID. Magnesium supplement added HS. Increase propanolol to 20mg  TID>> d/c'd, d/c clonidine patch given hypotension    03/27/2023    4:51 AM 03/26/2023    7:40 PM 03/26/2023    4:53 PM  Vitals with BMI  Weight   114 lbs 3 oz  Systolic 103 95   Diastolic 59 69   Pulse 72 89      20. Area of redness between left thumb and index finger: continue foam dressing  21. Scalp eczema: selsun blue ordered, continue  -9/7 patient's mother feels like this is causing her to itch, will start some hydrocortisone cream  22. Fatigue: improved, discussed with team may be secondary to anti-spasticity medications, B12 reviewed and is 335, in suboptimal range, will give 1,000U B12 injection 8/7, will order monthly as needed, metanx started, increase to BID, continue  23. Right wrist swelling: XR ordered and shows no acute fracture, shows 2.19mm ulnar variance, brace removed to given patient a break from it. Voltaren gel ordered prn for discomfort, continue, CT ordered and show no acute osseus injury, continue ace wrap  23. Dysphagia: continue D3/thins--tolerating this diet  24. MRSA nares positive:  negative on repeat, precautions d/ced  25. Tachycardic: resolved, d/c propanolol.  continueTizanidine increased to 6mg  TID. Resolved, amantadine increased back to 100mg  BID   10/28 HR controlled in 80's  -11/9-10/24- HR controlled- con't regimen  26.  Dyskinetic movements in head and neck - appears to be myoclonic jerks related to hypoxic brain injury - as discussed with pt mom, no specific meds for this on antispasticity meds , consioder benzo although with pt's SUD would avoid this class  EEG reviewed and was negative for seizure.  Decrease baclofen to 15mg  TID>> down to 10mg  QHS  27. Small sore on the base of 5th metatarsal head: local wound care, continue  28. Rash on face: resolved, discussed with mother could have been secondary to added sugars in nutritional supplements  29. Sacral pressure injury: air mattress ordered, Placed nursing order to offload q1 hour while awake, continue medihoney, wound care consulted, discussed with nursing that this is improving  30. Cervical extensor weakness: tried decreasing tizanidine but spasticity appears increased and BP elevated so will increase back to 6mg  TID, continue this dose, continue cervical collar>> now on 4mg  TID  31. Low protein stores: d/c prosource since patient hates these and is eating 100% of meals  32. Intermittent lethargy: labs reviewed and are stable, discussed with team may be secondary to anti-spasticity medications  33. Blood blister to left heel: continue foam heel protectors.  34. Suboptimal vitamin D level: increase to 5,000 U daily. continue this dose  35. Hypotension: d/c propanolol, decrease baclofen to 10mg  HS, continue this dose, BP reviewed and well controlled, conitnue current regimen  36. Foot pain: tramadol ordered prn, improved today, continue prn  37. S/p fall:  CT head reviewed and is stable  38. Headaches: continue tylenol and tramadol prn  39. Bilateral foot drop: bilateral AFOs  ordered    LOS: 129 days A FACE TO FACE EVALUATION WAS PERFORMED  Nicole [name] P Vasiliki Smaldone 03/27/2023, 1:35 PM

## 2023-03-27 NOTE — NC FL2 (Signed)
[place] MEDICAID FL2 LEVEL OF CARE FORM     IDENTIFICATION  Patient Name: Nicole [name] Birthdate: 1975/08/20 Sex: female Admission Date (Current Location): 11/18/2022  [place] and [place] Number:  Haynes Bast [id number] S Facility and Address:  [facility], [address], [place], [place] [address]      Provider Number: [id number]  Attending Physician Name and Address:  Horton Chin, MD  Relative Name and Phone Number:  Mariea Stable [phone]    Current Level of Care: Other (Comment) (Rehab) Recommended Level of Care: Skilled Nursing Facility Prior Approval Number:    Date Approved/Denied:   PASRR Number: [id number]  Discharge Plan: SNF    Current Diagnoses: Patient Active Problem List   Diagnosis Date Noted   Essential hypertension 01/29/2023   Pressure injury of skin 01/29/2023   Protein-calorie malnutrition, severe 01/05/2023   Hypoxic brain injury (HCC) 11/18/2022   Cavitary pneumonia 11/05/2022   Substance use 11/05/2022   Acute respiratory failure (HCC) 10/31/2022   Nonallergic rhinitis 08/23/2020   Mild persistent asthma without complication 06/14/2020   Other allergic rhinitis 06/14/2020   Adverse reaction to food, subsequent encounter 06/14/2020   IBS (irritable bowel syndrome) 05/24/2016   Chronic venous insufficiency 05/24/2016   Migraine 05/24/2016   Pulmonary nodules 10/27/2014   Hepatitis C, chronic (HCC)    Depression 10/09/2013   OSA on CPAP - sees Dr. Jenne Campus in ENT 10/09/2013   Allergic rhinitis 05/30/2012   Cough variant asthma 02/08/2012    Orientation RESPIRATION BLADDER Height & Weight     Self, Place  Normal Incontinent Weight: 114 lb 3.2 oz (51.8 kg) Height:  5\' 1"  (154.9 cm)  BEHAVIORAL SYMPTOMS/MOOD NEUROLOGICAL BOWEL NUTRITION STATUS      Incontinent (becoming able to tell needs to use restroom) Diet (Dys 3 thin liquids)  AMBULATORY STATUS COMMUNICATION OF NEEDS Skin   Extensive  Assist Verbally Normal PU Stage 1 Dressing: Daily PU Stage 2 Dressing: Daily                   Personal Care Assistance Level of Assistance  Bathing, Dressing Bathing Assistance: Maximum assistance Feeding assistance: Limited assistance (set up can feed self) Dressing Assistance: Maximum assistance Total Care Assistance: Maximum assistance   Functional Limitations Info  Sight, Speech Sight Info: Impaired Hearing Info: Adequate Speech Info: Impaired    SPECIAL CARE FACTORS FREQUENCY  Bowel and bladder program     PT Frequency: 2-3 x week OT Frequency: 2-3 x week Bowel and Bladder Program Frequency: Timed tolieting every 3-4 hours offer   Speech Therapy Frequency: 2x week      Contractures Contractures Info: Not present    Additional Factors Info  Code Status, Allergies Code Status Info: DNR Allergies Info: [place] nuts, cashews nuts Ceclor and Nickel     Isolation Precautions Info: Off MRSA precautions     Current Medications (03/27/2023):  This is the current hospital active medication list Current Facility-Administered Medications  Medication Dose Route Frequency Provider Last Rate Last Admin   acetaminophen (TYLENOL) tablet 650 mg  650 mg Oral Q6H PRN Charlton Amor, PA-C   650 mg at 03/26/23 2021   amantadine (SYMMETREL) 50 MG/5ML solution 100 mg  100 mg Oral BID Horton Chin, MD   100 mg at 03/26/23 2022   ascorbic acid (VITAMIN C) tablet 500 mg  500 mg Oral Daily Raulkar, Drema Pry, MD   500 mg at 03/26/23 0745   baclofen (LIORESAL) tablet 10 mg  10 mg Oral QHS Raulkar, Drema Pry, MD   10 mg at 03/26/23 2024   camphor-menthol (SARNA) lotion   Topical PRN Horton Chin, MD   Given at 03/24/23 2207   cholecalciferol (VITAMIN D3) 25 MCG (1000 UNIT) tablet 5,000 Units  5,000 Units Oral Daily Raulkar, Drema Pry, MD   5,000 Units at 03/26/23 0745   diclofenac Sodium (VOLTAREN) 1 % topical gel 2 g  2 g Topical QID Raulkar, Drema Pry, MD   2 g at  03/26/23 2026   enoxaparin (LOVENOX) injection 30 mg  30 mg Subcutaneous Q24H Pham, Minh Q, RPH-CPP   30 mg at 03/26/23 1714   gadobutrol (GADAVIST) 1 MMOL/ML injection 5 mL  5 mL Intravenous Once PRN Raulkar, Drema Pry, MD       Gerhardt's butt cream   Topical BID Jacquelynn Cree, PA-C   Given at 03/26/23 2030   hydrocerin (EUCERIN) cream   Topical BID Horton Chin, MD   Given at 03/26/23 2028   l-methylfolate-B6-B12 (METANX) 3-35-2 MG per tablet 1 tablet  1 tablet Oral BID Horton Chin, MD   1 tablet at 03/26/23 2024   magnesium gluconate (MAGONATE) tablet 500 mg  500 mg Oral QHS Raulkar, Drema Pry, MD   500 mg at 03/26/23 2022   melatonin tablet 3 mg  3 mg Oral QHS PRN Fanny Dance, MD   3 mg at 03/26/23 2023   multivitamin with minerals tablet 1 tablet  1 tablet Oral Daily Charlton Amor, PA-C   1 tablet at 03/26/23 0745   selenium sulfide (SELSUN) 1 % shampoo   Topical PRN Horton Chin, MD   Given at 01/17/23 2056   tiZANidine (ZANAFLEX) tablet 4 mg  4 mg Oral TID Horton Chin, MD   4 mg at 03/26/23 2023   traMADol (ULTRAM) tablet 50 mg  50 mg Oral Q6H PRN Horton Chin, MD   50 mg at 03/26/23 1714     Discharge Medications: Please see discharge summary for a list of discharge medications.  Relevant Imaging Results:  Relevant Lab Results:   Additional Information SSN: [id number]  needs restorative nursing  Javier Gell, Lemar Livings, LCSW

## 2023-03-27 NOTE — Progress Notes (Signed)
Occupational Therapy Session Note  Patient Details  Name: Nicole [name] MRN: [id number] Date of Birth: August 07, 1975  Today's Date: 03/27/2023 OT Individual Time: 7829-5621 OT Individual Time Calculation (min): 45 min    Short Term Goals: Week 6:  OT Short Term Goal 1 (Week 6): Patient will indicate need to void an dassist with trasnfer with max assist OT Short Term Goal 1 - Progress (Week 6): Other (comment) OT Short Term Goal 2 (Week 6): Patient will utilize left hand x 3 to aide with bimaual tasks with min cueing OT Short Term Goal 2 - Progress (Week 6): Other (comment) OT Short Term Goal 3 (Week 6): Patient will pull self to partial standing with mod assist OT Short Term Goal 3 - Progress (Week 6): Other (comment)  Skilled Therapeutic Interventions/Progress Updates:     Pt received sitting up in wc presenting to be in good spirits receptive to skilled OT session reporting 0/10 pain at rest- OT offering intermittent rest breaks, repositioning, and therapeutic support to optimize participation in therapy session. During functional transfers and BADLs, Pt reporting pain in L U/LEs without numerical values given. Pt requesting to get cleaned up on toilet at beginning of session and attempt to have a BM. Utilized stedy for transport to BR to facilitate increased opportunities for WB'ing through B LEs and sit<>stands. During session, Pt able to complete ~5 sit<>stands using stedy grab bars utilizing B UEs to pull self into partial standing position with min A and mod verbal cues for B LE positioning and tactile/verbal cues provided to facilitate increased hip extension. Pt able to maintain perched sitting position with close supervision during stedy transfer. Provided increased time on toilet, however no BM at this time. Pt able to maintain partial standing balance with min A using stedy grab bar while OT provided max A for 3/3 toileting tasks. Transported Pt back into room to wc using stedy. Worked on  dressing tasks seated in wc. Pt able to doff shirt with supervision and min verbal cues for technique. Pt able to apply deodorant with supervision and don clean shirt with min A for L UE positioning. Pt utilized B UE to position pants and preparation for LB dressing. Assistance required to weave B LEs into pants, however Pt motivated to participate by holding waist band of pants and reaching towards ground. Pt stood using stedy grab bar min A and maintained standing position while OT brought [name] Pants to waist. Positioned Pt at sink for grooming/hygiene tasks with Pt able to groom and style hair with supervision. Pt brushed teeth with mod verbal cues required to locate toothbrush on L side of sink and min A required for positioning toothbrush while donning toothpaste. Pt with improved task initiation, awareness, L UE functional hand use, and improved communication of needs this session. Pt was left resting in wc with call bell in reach, seat belt alarm on, tele-sitter on, and all needs met.    Therapy Documentation Precautions:  Precautions Precautions: Fall Precaution Comments: delayed processing, Increased tone UB and LB Required Braces or Orthoses: Other Brace Other Brace: B adjustable night splints; B elbow "cosey" splints; L palm protector; R mitt Restrictions Weight Bearing Restrictions: No   Therapy/Group: Individual Therapy  KESHIA HOEK 03/27/2023, 8:05 AM

## 2023-03-28 MED ORDER — BACLOFEN 10 MG PO TABS
20.0000 mg | ORAL_TABLET | Freq: Every day | ORAL | Status: DC
  Administered 2023-03-28 – 2023-04-19 (×23): 20 mg via ORAL
  Filled 2023-03-28 (×23): qty 2

## 2023-03-28 NOTE — Progress Notes (Signed)
Refused hand splints and orthosis boots

## 2023-03-28 NOTE — Progress Notes (Signed)
Occupational Therapy Session Note  Patient Details  Name: Nicole [name] MRN: [id number] Date of Birth: September 02, 1975  Today's Date: 03/28/2023 OT Individual Time: 7829-5621 OT Individual Time Calculation (min): 73 min    Short Term Goals: Week 9: OT Short Term Goal 1 (Week 9): Patient will change orientation of toothbrush/ hairbrush to accomodate task with min assist after set up  Skilled Therapeutic Interventions/Progress Updates:  Pt greeted semi reclined in bed eating breakfast with NT present for full supervision, pt agreeable to OT intervention, requesting to shower. Pt asking for pen and paper to write. Pt reporting she wanted to write a letter to "the guy she's been seeing."   Transfers/bed mobility: pt completed supine<>sit with MIN A needing assist to maneuver BLEs to EOB. Pt completed sit>stand to stedy with MIN A with pillow placed on knees in stedy, total  A transport into shower. Pt completed stand pivot from EOB>w/c with Rw and MODA mostly for cues and upright posture.   Therapeutic activity: worked on sequencing and problem solving with pt  instructed to match go fish cards. Pt needed MAX step by step cues to sequence task wit pt preferring just to stack cards in a pile.     ADLs:  Grooming: pt completed seated grooming tasks in w/c at sink with supervision/set- up, I.e oral care and hair care UB dressing: donned OH shirt with MIN A from bed level  LB dressing: donned pants from bed level with MAX A, pt able to bridge in bed  Footwear: donned shoes from bed level with total A  Bathing: pt completed bathing seated on shower seat with MIN A, pt needed cues for sequencing and safety during bathing.  Transfers: dependent transfer into shower with stedy  Self feeding: pt completed self feeding with RUE with supervision, pt asking for assistance appropriately needing help to open containers.   Ended session with pt seated in w/c with all needs within reach and safety belt alarm  activated.                    Therapy Documentation Precautions:  Precautions Precautions: Fall Precaution Comments: delayed processing, Increased tone UB and LB Required Braces or Orthoses: Other Brace Other Brace: B adjustable night splints; B elbow "cosey" splints; L palm protector; R mitt Restrictions Weight Bearing Restrictions: No  Pain: unrated pain reported in butt from sitting on shower seat even though towels placed over seat for comfort. Repositioned pt as needed for comfort.     Therapy/Group: Individual Therapy  Barron Schmid 03/28/2023, 12:15 PM

## 2023-03-28 NOTE — Progress Notes (Signed)
Physical Therapy Weekly Progress Note  Patient Details  Name: Nicole [name] MRN: [id number] Date of Birth: [birth year]  Beginning of progress report period: March 16, 2023 End of progress report period: March 28, 2023  Today's Date: 03/28/2023 PT Individual Time: 1400-1445 PT Individual Time Calculation (min): 45 min   This past reporting period, Ms. Lacoria Zawada is making significant progress with functional mobility. She is completing bed mobility at [facility] level, sit<>stands with modA to RW, and has started ambulating with RW up to 49ft with modA and RW (!). She is primarily limited by limited muscle length in both LE's, decreased activity tolerance, abnormal tone, and cognitive impairments. AFO consult completed on 11/13 with CPO from [facility] - plan on providing temporary anterior shell AFO's and monitoring need for custom AFO's before DC. Her therapy frequency was increased to 15/7 and now has been upgraded back to standard 3+ daily hours due to improved tolerance and progress.   Patient continues to demonstrate the following deficits muscle weakness and muscle joint tightness, decreased cardiorespiratoy endurance, abnormal tone and unbalanced muscle activation, decreased motor planning, decreased initiation, decreased attention, decreased awareness, decreased problem solving, decreased safety awareness, decreased memory, and delayed processing, and decreased standing balance, decreased postural control, and decreased balance strategies and therefore will continue to benefit from skilled PT intervention to increase functional independence with mobility.  Patient progressing toward long term goals..  Plan of care revisions: See LTG POC note - goals upgraded to minA overall and added ambulation goals.  PT Short Term Goals Week 1:  PT Short Term Goal 1 (Week 1): Pt will ambulate 41ft with modA and LRAD PT Short Term Goal 1 - Progress (Week 1): Not met PT Short Term Goal 2 (Week 1): Pt will  complete functional outcome measure to assess balance and falls risk PT Short Term Goal 2 - Progress (Week 1): Not met PT Short Term Goal 3 (Week 1): Week 13: Pt will complete bed<>chair transfers with totalA (pt <25%) PT Short Term Goal 3 - Progress (Week 1): Not met  Skilled Therapeutic Interventions/Progress Updates:      Pt lying in bed on arrival - agreeable to therapy treatment. No reports of pain.  Supine<>sitting EOB with modA for initiation, BLE management, and trunk support. Able to sit unsupported with close supervision at EOB. Stand pivot transfer with mod/maxA into w/c and then transported to day room gym.  Sit<>stand to RW with min/modA. Ambulates ~108ft + ~34ft with modA and RW. Flexed/crouched posture with narrow BOS, decreased heel strike bilaterally, downward gaze towards her feet. Cues throughout for correcting.   Standing with back against a wall to work on posture and standing balance. ModA for stand step transfer with RW to lean against wall and minA for standing balance as she worked on trunk elongation and cervical extension. Patient unable to tolerate her heels against wall as she stood and wasn't able to get her L knee extended to neutral while standing with upright posture.   Returned to her room and patient ended treatment sitting in w/c with safety belt alarm on, telesitter in room, all needs met. NT made aware of pt position.    Therapy Documentation Precautions:  Precautions Precautions: Fall Precaution Comments: delayed processing, Increased tone UB and LB Required Braces or Orthoses: Other Brace Other Brace: B adjustable night splints; B elbow "cosey" splints; L palm protector; R mitt Restrictions Weight Bearing Restrictions: No General:     Therapy/Group: Individual Therapy  Nashla Althoff P [facility] [place]  PT, DPT, CSRS  03/28/2023, 7:49 AM

## 2023-03-28 NOTE — Progress Notes (Signed)
Speech Language Pathology Daily Session Note  Patient Details  Name: Nicole [name] MRN: [id number] Date of Birth: 1976/05/05  Today's Date: 03/28/2023 SLP Individual Time: 1100-1158 SLP Individual Time Calculation (min): 58 min  Short Term Goals: Week 9: SLP Short Term Goal 1 (Week 9): Week 18 - Pt will solve basic to mildly complex problems with 80% accuracy given min assist. SLP Short Term Goal 2 (Week 9): Week 18 -Patient will utilize external aids for orientation to place and time with supervision level verbal cues SLP Short Term Goal 3 (Week 9): Week 18 - Pt will demonstrate sustained attention to basic tasks for 15 minutes with Mod A multimodal cues for redirection. SLP Short Term Goal 4 (Week 9): Week 18 - Pt will utilize increased vocal intensity w/ MinA verbal cues to remain 90% intelligibile at sentence level  Skilled Therapeutic Interventions: Skilled therapy session focused on cognitive goals. Upon entrance, patient requesting to call mother stating she needed to meet her "in the parking lot to go to therapy." SLP/NT reassured patient that she is scheduled to work with speech therapy at this time and does not have another place to be. SLP facilitated session by providing minA during orientation to time task using calendar. SLP continued to address problem solving goals through synonym/antonym tasks. Patient benefited from minA to name synonym/antonyms however required increased cues as vocabulary word difficulty increased. Patient perseverative on writing this session, though largely iIllegible. SLP recommended writing slow, and focusing on one letter at a time. Patient requesting to use bathroom at end of session so remaining 3 minutes missed due to nursing care. Patient left in chair with alarm set and call bell in reach. Continue POC.   Pain No pain reported   Therapy/Group: Individual Therapy  Mckaylin Bastien M.A., CF-SLP 03/28/2023, 12:00 PM

## 2023-03-28 NOTE — Patient Care Conference (Signed)
Inpatient RehabilitationTeam Conference and Plan of Care Update Date: 03/28/2023   Time: 11:06 AM    Patient Name: Nicole [name]      Medical Record Number: [id number]  Date of Birth: 04-Jun-1975 Sex: Female         Room/Bed: 4W09C/4W09C-01 Payor Info: Payor: [employer] / Plan: [employer] / Product Type: *No Product type* /    Admit Date/Time:  11/18/2022  3:15 PM  Primary Diagnosis:  Hypoxic brain injury [facility])  Hospital Problems: Principal Problem:   Hypoxic brain injury (HCC) Active Problems:   Protein-calorie malnutrition, severe   Essential hypertension   Pressure injury of skin    Expected Discharge Date: Expected Discharge Date:  (SNF pending)  Team Members Present: Physician leading conference: Dr. Sula Soda Social Worker Present: Dossie Der, LCSW Nurse Present: Chana Bode, RN PT Present: Wynelle Link, PT OT Present: Bonnell Public, OT SLP Present: Feliberto Gottron, SLP PPS Coordinator present : Fae Pippin, SLP     Current Status/Progress Goal Weekly Team Focus  Bowel/Bladder   Patient is incontinent of of bladder/bowel, LBM 11/11'24   Regain continence   Time toileting Q 2hrs and prn    Swallow/Nutrition/ Hydration               ADL's   mod for UB ADLs, max LB ADLs, toileting max A if she can be continent, min A for grooming and self feeding, transfers with mod to max; able to pull herself into a partial standing position with min A with difficulty maintaining knee extension and ankle cords are still very tight   max A- could be upgraded to min A UB and mod LB   postural control, transfers, therapy changed to regular schedule, safty precautions, consistent orientation, functional mobility    Mobility   Continuing show functional improvements. ModA bed mobility, ModA squat pivot transfers, min/modA for sit<>stands in the [place]. Began gait training with LiteGait and progressed to RW - ambulating 61ft modA with +2 for  wheelchair follow. AFO consult started on 11/12 for B custom braces?   Upgraded to minA and added gait goals  Functional transfers, ambulation, stretching into extension, standing balance/tolerance    Communication   decreased vocal intensity, required min verbal cues to increase intelligibility at the sentence/conversation level, though not consistent. Some instances of self-monistoring/self-correcting. Would like to start reading again to pass the time but does not have any books that she likes to read available.   mod A at the sentence/conversation level   steady progress toward improving speech intelligibility    Safety/Cognition/ Behavioral Observations  improving sustained attention, orientation, and basic problem solving   modA   sustained attention, orientation, mildly complex problem solving    Pain   occassional bilateral leg pains with movement and activites   < 3/10 On pain scale   Assess QS and prn    Skin   Skin intact,Sacrum redness improving continue barries cream and repostioning   Maintain skin integrity while on IP Rehab  Monitor and assist QS/PRN      Discharge Planning:  Reached out to management for assist in bed search-no bed offers. Continue to search-pt making progress in therapies this week   Team Discussion: Patient continues to make functional progress.   Patient on target to meet rehab goals: yes, currently able to feed self with min assist. Completes sit- stand with mod assist and stand pivot transfers with max assist. Requires mod assist for upper body care and max  assist for lower body care and toileting.  Able to ambulate 57ft modA with +2 for wheelchair follow. Continue to work on attention, orientation and speech intelligibility; currently min assist at the sentence level.   *See Care Plan and progress notes for long and short-term goals.   Revisions to Treatment Plan:  Custom bracing on hold for now pending progress: AFO consult started on  11/12 for B custom braces Recreation therapy referral Change to regular therapy schedule Began gait training with LiteGait and progressed to RW   Teaching Needs: Safety, medications, skin care, transfers, toileting, etc.   Current Barriers to Discharge: Decreased caregiver support, Home enviroment access/layout, Incontinence, and Wound care  Possible Resolutions to Barriers: SNF pending     Medical Summary Current Status: bilateral foot drop, insomnia, spasticity, hypotension, knee and back pain  Barriers to Discharge: Medical stability  Barriers to Discharge Comments: bilateral foot drop, insomnia, spasticity, hypotension, knee and back pain Possible Resolutions to [employer] Focus: bilateral AFOs ordered, increase basclofen to 20mg  HS, conitnue tizanidine, continue to monitor blood pressure TID, kpad and tramadol ordered   Continued Need for Acute Rehabilitation Level of Care: The patient requires daily medical management by a physician with specialized training in physical medicine and rehabilitation for the following reasons: Direction of a multidisciplinary physical rehabilitation program to maximize functional independence : Yes Medical management of patient stability for increased activity during participation in an intensive rehabilitation regime.: Yes Analysis of laboratory values and/or radiology reports with any subsequent need for medication adjustment and/or medical intervention. : Yes   I attest that I was present, lead the team conference, and concur with the assessment and plan of the team.   Chana Bode B 03/28/2023, 12:55 PM

## 2023-03-28 NOTE — Progress Notes (Signed)
PROGRESS NOTE   Subjective/Complaints: No new complaints this morning Commended on her walking! She is practicing writing 8/10 lower back pain- kpad ordered   ROS:  Pt denies SOB, abd pain, CP, N/V/C/D, and vision changes  . +bladder incontinence, +migraines- well controlled, +spasticity- stable, +impaired ambulation, +low back pain  Objective:   No results found. No results for input(s): "WBC", "HGB", "HCT", "PLT" in the last 72 hours.     No results for input(s): "NA", "K", "CL", "CO2", "GLUCOSE", "BUN", "CREATININE", "CALCIUM" in the last 72 hours.     Intake/Output Summary (Last 24 hours) at 03/28/2023 0946 Last data filed at 03/28/2023 0815 Gross per 24 hour  Intake 720 ml  Output --  Net 720 ml       Pressure Injury 02/09/23 Heel Left Deep Tissue Pressure Injury - Purple or maroon localized area of discolored intact skin or blood-filled blister due to damage of underlying soft tissue from pressure and/or shear. Black/purple and boggy,  size of a quar (Active)  02/09/23 0543  Location: Heel  Location Orientation: Left  Staging: Deep Tissue Pressure Injury - Purple or maroon localized area of discolored intact skin or blood-filled blister due to damage of underlying soft tissue from pressure and/or shear.  Wound Description (Comments): Black/purple and boggy,  size of a quarter.  Foam dressing applied  Present on Admission: No    Physical Exam: Vital Signs Blood pressure 104/69, pulse 82, temperature 98.4 F (36.9 C), temperature source Oral, resp. rate 16, height 5\' 1"  (1.549 m), weight 51.8 kg, SpO2 97%.     General: awake, alert, resting in bed; NAD HENT: conjugate gaze; oropharynx moist, dons glasses independently CV: regular rate and rhythm; no JVD Pulmonary: CTA B/L; no W/R/R- good air movement GI: soft, NT, ND, (+)BS Psychiatric: engaging, interactive, impulsive at times Neurological:  delayed responses- sometimes difficult to understand but repeats clearer Skin: + Scaling dried skin on bilateral heels  - 2 small excoriations, sore on the base of the 5th metatarsal head -no longer present - rash on face - no longer present - stage 2 to right toe -no longer present  + area of redness between left thumb and index finger-now minimal, purple bruise left buttock unstageable - not examined +redness to bilateral elbows -not appreciated + Left heel stage III DTI, covered in foam dressing  Neurologic: AAOx2,  to year but not month or date. Follows most simple commands.  + Hypophonation, speech intelligability much improved MAS 1 right elbow,MAS 1 in RIght finger and wrist flexors  MAS 3 knee flexors, and MAS 3 L>R  hip adductors  MAS 3 L elbow , MAS 3-4 in L wrist and finger flexors  + LUE WHO--- Hypersensitivity to left hand improved, +left hand contracture, exam stable 11/13  Motor 0/5 strength in LUE and LLE, showing volitional movements in right lower extremity but difficulty to obtain MMT due to her pain/braces, able to do upper body dressing now! Ambulated with RW!  Twice daily for right upper extremity 1/5 finger flexion, otherwise no volitional movement of left upper extremity or bilateral lowers + BL UE WHO.    Musculoskeletal: reduced bilateral shoulder and elbow ROM  due to increased tone , pain with passive knee extension bilaterally   . GU: bladder incontinence  Assessment/Plan: 1. Functional deficits which require 3+ hours per day of interdisciplinary therapy in a comprehensive inpatient rehab setting. Physiatrist is providing close team supervision and 24 hour management of active medical problems listed below. Physiatrist and rehab team continue to assess barriers to discharge/monitor patient progress toward functional and medical goals  Care Tool:  Bathing    Body parts bathed by patient: Face, Abdomen, Chest, Left upper leg, Right upper leg, Left arm,  Right lower leg, Left lower leg   Body parts bathed by helper: Right arm, Left arm, Front perineal area, Buttocks, Left lower leg, Right lower leg Body parts n/a: Right arm, Left arm, Front perineal area, Buttocks, Right upper leg, Left upper leg, Right lower leg, Left lower leg   Bathing assist Assist Level: Moderate Assistance - Patient 50 - 74%     Upper Body Dressing/Undressing Upper body dressing   What is the patient wearing?: Pull over shirt    Upper body assist Assist Level: Minimal Assistance - Patient > 75%    Lower Body Dressing/Undressing Lower body dressing      What is the patient wearing?: Pants     Lower body assist Assist for lower body dressing: Maximal Assistance - Patient 25 - 49%     Toileting Toileting Toileting Activity did not occur (Clothing management and hygiene only): N/A (no void or bm)  Toileting assist Assist for toileting: Maximal Assistance - Patient 25 - 49%     Transfers Chair/bed transfer  Transfers assist  Chair/bed transfer activity did not occur: Safety/medical concerns  Chair/bed transfer assist level: Total Assistance - Patient < 25%     Locomotion Ambulation   Ambulation assist   Ambulation activity did not occur: Safety/medical concerns  Assist level: 2 helpers   Max distance: 57ft   Walk 10 feet activity   Assist  Walk 10 feet activity did not occur: Safety/medical concerns  Assist level: 2 helpers     Walk 50 feet activity   Assist Walk 50 feet with 2 turns activity did not occur: Safety/medical concerns         Walk 150 feet activity   Assist Walk 150 feet activity did not occur: Safety/medical concerns         Walk 10 feet on uneven surface  activity   Assist Walk 10 feet on uneven surfaces activity did not occur: Safety/medical concerns         Wheelchair     Assist Is the patient using a wheelchair?: Yes Type of Wheelchair: Manual    Wheelchair assist level: Dependent -  Patient 0% Max wheelchair distance: 40ft    Wheelchair 50 feet with 2 turns activity    Assist    Wheelchair 50 feet with 2 turns activity did not occur: Safety/medical concerns   Assist Level: Dependent - Patient 0%   Wheelchair 150 feet activity     Assist  Wheelchair 150 feet activity did not occur: Safety/medical concerns   Assist Level: Dependent - Patient 0%   Blood pressure 104/69, pulse 82, temperature 98.4 F (36.9 C), temperature source Oral, resp. rate 16, height 5\' 1"  (1.549 m), weight 51.8 kg, SpO2 97%.  Medical Problem List and Plan: 1. 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  Impaired mobility: continue Lovenox, weekly creatinine ordered             -antiplatelet therapy: N/A  3. Knee pain: Tylenol as needed, continue voltaren gel scheduled.   XR reviewed and is stable, discussed with therapy  4. History of anxiety: discussed with her mom that she feels this was a big risk factor for her accident, propanolol ordered. Continue magnesium supplement  5. Neuropsych/cognition: This patient is not capable of making decisions on her own behalf  - 10/12: Still not capable of medical decision making; BUT oriented and communicating, following directions - MUCH improved .Will add PRN melatonin 3mg  10/20 for insomnia  6. Left buttock unstageable ulcer.  Continue Medihoney to buttock wound daily cover with foam dressing.  Changing dressing every 3 days or as needed soiling   7. Fluids/Electrolytes/Nutrition: Routine in and outs with follow-up chemistries  -Eating  well with  cueing  8.  Cavitary right lower lobe pneumonia likely aspiration pneumonia/MRSA pneumonia.  Resolved    Latest Ref Rng & Units 02/05/2023   11:12 AM 01/22/2023    6:36 AM 12/20/2022   10:54 AM  CBC  WBC 4.0 - 10.5 K/uL 8.5  8.1  5.6   Hemoglobin 12.0 - 15.0 g/dL 28.4  13.2  44.0   Hematocrit 36.0 - 46.0 % 46.7  47.5  42.8   Platelets 150 - 400 K/uL 284  274  253      9.  History of drug abuse.  Positive cocaine on urine drug screen.  Provide counseling  10.  AKI/hypovolemia and ATN.  Resolved Appears to have fair fluid intake, creatinine has been stable  11/3- Push fluids due ot low BP  -03/24/23 Cr stable yesterday, BPs stable, monitor    Latest Ref Rng & Units 03/23/2023    5:11 AM 03/16/2023    6:53 AM 03/09/2023    6:13 AM  BMP  Creatinine 0.44 - 1.00 mg/dL 1.02  7.25  3.66     11.  Mild transaminitis with rhabdomyolysis.  Both resolved  12.  Hypotension. Resolved, d/c midodrine  -BP stable  11/2- BP was running 87 systolic last night- and max of 110 in last 24 hours- not dizzy per chart, but if develops any dizziness, suggest primary team restarts Midodrine. Amantadine can cause hypotension, FYI  11/3- No dizziness- BP on low side- will push fluids, but might restart Midodrine. If becomes yellow MEWS again, will restart midodrine today- d/w nursing  -11/9-10/24 BPs stable, monitor  Vitals:   03/24/23 1949 03/25/23 0358 03/25/23 1354 03/25/23 1910  BP: 120/79 104/80 97/72 (!) 111/98   03/26/23 0355 03/26/23 1304 03/26/23 1631 03/26/23 1940  BP: 103/82 112/75 98/64 95/69    03/27/23 0451 03/27/23 1507 03/27/23 1935 03/28/23 0438  BP: (!) 103/59 98/71 107/73 104/69    13. Impaired initiation: continue amantadine 100mg  BID  14. Hyperglycemia: CBGs mildly elevated, d/ced checks. Prosource d/ced as per patient's request  15. Spasticity:   -discussed serial casting with OT and her mom but she defers given pain she had when this was attempted  -baclofen and tizanidine  decreased due to hypotension, spasticity has been stable. Continue wrist, elbow brace, and PRAFOs. Ordered a night splint for LLE  Repeat CMP today given chronic tizanidine use  -9/7 continues  to have significant tone despite use of tizanidine and baclofen, may be beneficial consider baclofen pump in outpatient setting if this causes significant difficulties in care  9/8 consider decrease baclofen if dyskinetic movements continue  10/5-6  continue orthotics, ROM, positioning  when possible  16. Bowel and bladder incontinence: continue bowel and bladder program  17. Left hand skin maceration: Eucerin ordered, discussed hand split with OT, continue  18. History of magnesium deficiency: magnesium 500mg  started HS  19. HTN: see above Increase tizanidine to 6mg  TID--now 4mg  TID. Magnesium supplement added HS. Increase propanolol to 20mg  TID>> d/c'd, d/c clonidine patch given hypotension    03/28/2023    4:38 AM 03/27/2023    7:35 PM 03/27/2023    3:07 PM  Vitals with BMI  Systolic 104 107 98  Diastolic 69 73 71  Pulse 82 84 88     20. Area of redness between left thumb and index finger: continue foam dressing  21. Scalp eczema: selsun blue ordered, continue  -9/7 patient's mother feels like this is causing her to itch, will start some hydrocortisone cream  22. Fatigue: improved, discussed with team may be secondary to anti-spasticity medications, B12 reviewed and is 335, in suboptimal range, will give 1,000U B12 injection 8/7, will order monthly as needed, metanx started, increase to BID, continue  23. Right wrist swelling: XR ordered and shows no acute fracture, shows 2.62mm ulnar variance, brace removed to given patient a break from it. Voltaren gel ordered prn for discomfort, continue, CT ordered and show no acute osseus injury, continue ace wrap  23. Dysphagia: continue D3/thins--tolerating this diet  24. MRSA nares positive: negative on repeat, precautions d/ced  25. Tachycardic:  resolved, d/c propanolol.  continueTizanidine increased to 6mg  TID. Resolved, amantadine increased back to 100mg  BID   10/28 HR controlled in 80's  -11/9-10/24- HR controlled- con't regimen  26.  Dyskinetic movements in head and neck - appears to be myoclonic jerks related to hypoxic brain injury - as discussed with pt mom, no specific meds for this on antispasticity meds , consioder benzo although with pt's SUD would avoid this class  EEG reviewed and was negative for seizure.  Decrease baclofen to 15mg  TID>> down to 10mg  QHS  27. Small sore on the base of 5th metatarsal head: local wound care, continue  28. Rash on face: resolved, discussed with mother could have been secondary to added sugars in nutritional supplements  29. Sacral pressure injury: air mattress ordered, Placed nursing order to offload q1 hour while awake, continue medihoney, wound care consulted, discussed with nursing that this is improving  30. Cervical extensor weakness: tried decreasing tizanidine but spasticity appears increased and BP elevated so will increase back to 6mg  TID, continue this dose, continue cervical collar>> now on 4mg  TID  31. Low protein stores: d/c prosource since patient hates these and is eating 100% of meals  32. Intermittent lethargy: labs reviewed and are stable, discussed with team may be secondary to anti-spasticity medications  33. Blood blister to left heel: continue foam heel protectors.  34. Suboptimal vitamin D level: increase to 5,000 U daily. continue this dose  35. Hypotension: d/c propanolol, decrease baclofen to 10mg  HS, continue this dose, BP reviewed and well controlled, conitnue current regimen  36. Foot pain: tramadol ordered prn, improved today, continue prn  37. S/p fall: CT head reviewed and is stable  38. Headaches: continue tylenol and tramadol prn  39. Bilateral foot drop: bilateral AFOs ordered, discussed that these can  help her to walk better  40, Low back pain:  kpad ordered    LOS: 130 days A FACE TO FACE EVALUATION WAS PERFORMED  Nicole [name] 03/28/2023, 9:46 AM

## 2023-03-28 NOTE — Plan of Care (Signed)
[name] plan of care adjusted to regular therapy frequency (3+ hours) after speaking with care team and discussed with MD in team conference as pt currently unable to tolerate current therapy schedule with OT, PT, and SLP.

## 2023-03-29 LAB — URINALYSIS, ROUTINE W REFLEX MICROSCOPIC
Bilirubin Urine: NEGATIVE
Glucose, UA: NEGATIVE mg/dL
Hgb urine dipstick: NEGATIVE
Ketones, ur: NEGATIVE mg/dL
Leukocytes,Ua: NEGATIVE
Nitrite: NEGATIVE
Protein, ur: NEGATIVE mg/dL
Specific Gravity, Urine: 1.01 (ref 1.005–1.030)
pH: 6 (ref 5.0–8.0)

## 2023-03-29 MED ORDER — GABAPENTIN 100 MG PO CAPS
100.0000 mg | ORAL_CAPSULE | Freq: Three times a day (TID) | ORAL | Status: DC
  Administered 2023-03-29 – 2023-05-22 (×161): 100 mg via ORAL
  Filled 2023-03-29 (×159): qty 1

## 2023-03-29 MED ORDER — CLOTRIMAZOLE 1 % VA CREA
1.0000 | TOPICAL_CREAM | Freq: Every day | VAGINAL | Status: DC
  Administered 2023-03-29 – 2023-04-02 (×5): 1 via VAGINAL
  Filled 2023-03-29 (×2): qty 45

## 2023-03-29 MED ORDER — TIZANIDINE HCL 4 MG PO TABS
2.0000 mg | ORAL_TABLET | Freq: Three times a day (TID) | ORAL | Status: DC
  Administered 2023-03-29 – 2023-04-03 (×15): 2 mg via ORAL
  Filled 2023-03-29 (×15): qty 1

## 2023-03-29 NOTE — Progress Notes (Signed)
Physical Therapy Session Note  Patient Details  Name: Nicole [name] MRN: [id number] Date of Birth: [birth year]  Today's Date: 03/29/2023 PT Individual Time: 1415-1530 PT Individual Time Calculation (min): 75 min   Short Term Goals: Week 1:  PT Short Term Goal 1 (Week 1): Pt will ambulate 88ft with modA and LRAD PT Short Term Goal 1 - Progress (Week 1): Not met PT Short Term Goal 2 (Week 1): Pt will complete functional outcome measure to assess balance and falls risk PT Short Term Goal 2 - Progress (Week 1): Not met PT Short Term Goal 3 (Week 1): Week 13: Pt will complete bed<>chair transfers with totalA (pt <25%) PT Short Term Goal 3 - Progress (Week 1): Not met  Skilled Therapeutic Interventions/Progress Updates:      Pt sitting in w/c on arrival - agreeable to therapy session. No reports of resting pain.  Propels herself ~78ft with supervision at wheelchair level using B feet to foot propel. Slow speed and not efficient but able to complete with ++ time to work on motor control and B coordination.   Gait training completed 2x41ft with modA and RW - continuing to work on widening BOS, upright posture, increasing heel strike bilaterally, and lengthening stride length. Patient reports pain in her L heel/calf while ambulating.   Initiated stair training using the 6" steps. Pt completed 2x4 (seated rest break) steps with 2 hand rails with modA for ascent and maxA for descent. Self selecting reciprocal stepping for ascent and step-to pattern for descent. Difficult for her to go down the stairs due to tone.   She participated in [employer] as an individual treatment - worked on rhythmic movements, stretching for BLE/BUE, choosing songs for the group, and general there-ex for all limbs. Patient performed best when provided direct instruction and cues for initiation as she can easily become internally and externally distracted. All activities were performed at seated level for safety.  Returned  to her room and patient requesting to lie down in bed. Completed squat pivot transfer with mod/maxA into bed and maxA for returning to supine position. Left HOB flat to encourage continued stretching and elongation.    Therapy Documentation Precautions:  Precautions Precautions: Fall Precaution Comments: delayed processing, Increased tone UB and LB Required Braces or Orthoses: Other Brace Other Brace: B adjustable night splints; B elbow "cosey" splints; L palm protector; R mitt Restrictions Weight Bearing Restrictions: No General:      Therapy/Group: Individual Therapy  Kinsie Belford P Ralyn Stlaurent 03/29/2023, 7:50 AM

## 2023-03-29 NOTE — Progress Notes (Signed)
Speech Language Pathology Daily Session Note  Patient Details  Name: Nicole [name] MRN: [id number] Date of Birth: April 04, 1976  Today's Date: 03/29/2023 SLP Individual Time: 1000-1057 SLP Individual Time Calculation (min): 57 min  Short Term Goals: Week 9: SLP Short Term Goal 1 (Week 9): Week 18 - Pt will solve basic to mildly complex problems with 80% accuracy given min assist. SLP Short Term Goal 2 (Week 9): Week 18 -Patient will utilize external aids for orientation to place and time with supervision level verbal cues SLP Short Term Goal 3 (Week 9): Week 18 - Pt will demonstrate sustained attention to basic tasks for 15 minutes with Mod A multimodal cues for redirection. SLP Short Term Goal 4 (Week 9): Week 18 - Pt will utilize increased vocal intensity w/ MinA verbal cues to remain 90% intelligibile at sentence level  Skilled Therapeutic Interventions: SLP conducted skilled therapy session targeting communication and cognitive retraining goals. SLP received patient from nurses' station then transported via wheelchair to speech therapy office. Patient noticeably somnolent from earlier therapy session but rousable to variable degrees and for varying amounts of time throughout session. SLP conducted various cognitive tasks and encouraged increased vocal intensity throughout session. Given somnolence, patient demonstrated decrease in vocal intensity and sustained attention compared to previous session. Required max cues for attention and mod cues for increased vocal intensity at the sentence level. During crossword task, patient benefited from min cues to use available information to come up with answers to clues. Patient requested to return to room vs. Nurses' station at end of session, thus patient left in chair with chair alarm set and call bell within reach, SLP will continue to target plan of care as appropriate.      Pain Pain Assessment Pain Scale: Faces Pain Score: 1  Faces Pain Scale: No  hurt  Therapy/Group: Individual Therapy  [name], M.A., CCC-SLP  [name] 03/29/2023, 12:50 PM

## 2023-03-29 NOTE — Progress Notes (Signed)
Occupational Therapy Weekly Progress Note  Patient Details  Name: Nicole [name] MRN: [id number] Date of Birth: 09-Apr-1976  Beginning of progress report period: March 22, 2023 End of progress report period: March 29, 2023  Today's Date: 03/29/2023 OT Individual Time: 4098-1191 OT Individual Time Calculation (min): 60 min    Patient has met 2 of 2 short term goals.  [name] continues to make good progress. LTG have been upgraded and she is now back on a regular 3 hr therapy schedule. Pt has begun walking with a RW with mod A with difficulty with full hip and knee extension and ongoing sensitivity and joint pain- transfers with RW require more assistance due to the turning aspect- requiring physical assistance and assistance for device. However with bilateral UE support pt able to come into standing position with min guard for clothing management. Pt continues to be incontinent of urine -however can now tell you she has to void but by the time she finishes telling you she has already voided heavily in the brief. Pt is showering in seated position with mod A. Pt can dress UB with min to mod A and Lb dressing/ footwear with max A. Pt can be inconsistently oriented x3. Pt is also incorporating her left hand more in functional bilaterally tasks such as holding onto the walker, washing, putting toothpaste on toothbrush etc.  Patient continues to demonstrate the following deficits: muscle weakness and muscle joint tightness, decreased cardiorespiratoy endurance, impaired timing and sequencing, abnormal tone, unbalanced muscle activation, and decreased coordination, decreased midline orientation, decreased attention, decreased awareness, decreased problem solving, decreased safety awareness, and decreased memory, and decreased sitting balance, decreased standing balance, decreased postural control, decreased balance strategies, and difficulty maintaining precautions and therefore will continue to benefit from  skilled OT intervention to enhance overall performance with BADL and Reduce care partner burden.  Patient progressing toward long term goals..  Continue plan of care. LTG upgraded at this time.   OT Short Term Goals Week 9: OT Short Term Goal 1 (Week 9): week 18: Pt will transfer to toilet with mod A with RW OT Short Term Goal 2 (Week 9): Week 18: Pt will be able to come to EOB in prep for ADLs with min A OT Short Term Goal 3 (Week 9): Pt will feed self 50% of meal with min A OT Short Term Goal 4 (Week 9): Week 18: Pt will use external aids to be oriented x3 with mod A  Skilled Therapeutic Interventions/Progress Updates:    1:1 Pt received in the bed. Pt came to EOB with min A for LE management. Pt reported she needed to void urine but by the time she finished telling me she had already voided in the brief. Pt transferred with the RW with mod A - with min A for the sit to stand with RW. Pt able to hold the RW with her left hand!. Pt does require A to steer the RW. Pt transferred into the shower to the padded tub bench with max A with difficulty with pivot in. Pt continues to show sensitivity with touch especially her chest, back, left UE and feet.  MD aware and discussed in rounds. Pt able to put soap on wash cloth and soap up cloth bilaterally and wash chest, arms, peri area, thighs and stomach. Pt with lateral lean to the left requiring frequent cues to correct balance and use the grab bar to her right to help correct herself. Pt transferred bench to w/c with stand  pivot with min A. Pt participated in donning shirt and threading both legs in pants with both her hands with encouragement and cues. Had pt thread left leg first and pt had more success. Pt performed sit to stand pulling up at the sink with both hands with contact guard. Pt brushed teeth - demonstrating ability to orient and move around tooth brush to effectively clean mouth. She also put on the tooth paste with extra time. Pt self propelled  w/c with bilateral feet with mod A from room to RN station. Left with seat belt donned.  Therapy Documentation Precautions:  Precautions Precautions: Fall Precaution Comments: delayed processing, Increased tone UB and LB Required Braces or Orthoses: Other Brace Other Brace: B adjustable night splints; B elbow "cosey" splints; L palm protector; R mitt Restrictions Weight Bearing Restrictions: No  Pain: Not rated had meds at beginning of session and pt reports sensitivity in bilateral feet and in breast.  Therapy/Group: Individual Therapy  Roney Mans [facility] 03/29/2023, 7:32 AM

## 2023-03-29 NOTE — Progress Notes (Signed)
Speech Language Pathology Weekly Progress and Session Note  Patient Details  Name: Nicole [name] MRN: [id number] Date of Birth: December 06, 1975  Beginning of progress report period: March 23, 2023 End of progress report period: March 29, 2023  Today's Date: 03/29/2023 SLP Individual Time: 1000-1057 SLP Individual Time Calculation (min): 57 min  Short Term Goals: Week 9: SLP Short Term Goal 1 (Week 9): Week 18 - Pt will solve basic to mildly complex problems with 80% accuracy given min assist. SLP Short Term Goal 1 - Progress (Week 9): Met SLP Short Term Goal 2 (Week 9): Week 49 -Patient will utilize external aids for orientation to place and time with supervision level verbal cues SLP Short Term Goal 2 - Progress (Week 9): Not met SLP Short Term Goal 3 (Week 9): Week 18 - Pt will demonstrate sustained attention to basic tasks for 15 minutes with Mod A multimodal cues for redirection. SLP Short Term Goal 3 - Progress (Week 9): Met SLP Short Term Goal 4 (Week 9): Week 18 - Pt will utilize increased vocal intensity w/ MinA verbal cues to remain 90% intelligibile at sentence level SLP Short Term Goal 4 - Progress (Week 9): Met  New Short Term Goals: Week 9: SLP Short Term Goal 1 (Week 9): Week 19 - Pt will solve basic to mildly complex problems with 90% accuracy given min assist. SLP Short Term Goal 2 (Week 9): Week 19 -Patient will utilize external aids for orientation to place and time with supervision level verbal cues SLP Short Term Goal 3 (Week 9): Week 19 - Pt will demonstrate sustained attention to basic tasks for 15 minutes with Min A multimodal cues for redirection. SLP Short Term Goal 4 (Week 9): Week 19 - Pt will utilize increased vocal intensity w/ supervision verbal cues to remain 90% intelligibile at sentence level  Weekly Progress Updates: Patient is making excellent progress towards therapy goals meeting 3/4 short term goals set this reporting period. At most recent session,  patient more somnolent than is typical, thus this progress note reflects average performance throughout the whole week rather than performance observed at most recent session. Patient requires overall min assist to maintain 90% intelligibility at the sentence level and min-mod assist to sustain attention to tasks for 15+ minutes. Patient solves basic familiar problems with 100% accuracy and familiar mildly complex problems with 80% accuracy with min assist. Patient continues to be disoriented to time despite frequent reorientation and placement of visual aids in room. Patient and family education ongoing. Patient will continue to benefit from skilled therapy services during remainder of CIR stay.    Intensity: Minumum of 1-2 x/day, 30 to 90 minutes Frequency: 3 to 5 out of 7 days Duration/Length of Stay: TBD due to SNF placement Treatment/Interventions: Cognitive remediation/compensation;Internal/external aids;Speech/Language facilitation;Cueing hierarchy;Environmental controls;Therapeutic Activities;Functional tasks;Multimodal communication approach;Patient/family education;Therapeutic Exercise  [name], M.A., CCC-SLP  [name] 03/29/2023, 2:27 PM

## 2023-03-29 NOTE — Progress Notes (Signed)
PROGRESS NOTE   Subjective/Complaints: C/o vaginal itching- clotrimazole ordered, UA ordered via cath since she is incontinent Therapy reports whole body sensitivity, discussed starting gabapentin   ROS:  Pt denies SOB, abd pain, CP, N/V/C/D, and vision changes  . +bladder incontinence, +migraines- well controlled, +spasticity- stable, +impaired ambulation, +low back pain, +whole body hypersensitivity  Objective:   No results found. No results for input(s): "WBC", "HGB", "HCT", "PLT" in the last 72 hours.     No results for input(s): "NA", "K", "CL", "CO2", "GLUCOSE", "BUN", "CREATININE", "CALCIUM" in the last 72 hours.     Intake/Output Summary (Last 24 hours) at 03/29/2023 1303 Last data filed at 03/29/2023 0811 Gross per 24 hour  Intake 857 ml  Output --  Net 857 ml       Pressure Injury 02/09/23 Heel Left Deep Tissue Pressure Injury - Purple or maroon localized area of discolored intact skin or blood-filled blister due to damage of underlying soft tissue from pressure and/or shear. Black/purple and boggy,  size of a quar (Active)  02/09/23 0543  Location: Heel  Location Orientation: Left  Staging: Deep Tissue Pressure Injury - Purple or maroon localized area of discolored intact skin or blood-filled blister due to damage of underlying soft tissue from pressure and/or shear.  Wound Description (Comments): Black/purple and boggy,  size of a quarter.  Foam dressing applied  Present on Admission: No    Physical Exam: Vital Signs Blood pressure (!) 85/59, pulse 89, temperature 98.4 F (36.9 C), resp. rate 16, height 5\' 1"  (1.549 m), weight 51.8 kg, SpO2 97%.     General: awake, alert, resting in bed; NAD HENT: conjugate gaze; oropharynx moist, dons glasses independently CV: regular rate and rhythm; no JVD Pulmonary: CTA B/L; no W/R/R- good air movement GI: soft, NT, ND, (+)BS Psychiatric: engaging,  interactive, impulsive at times Neurological: delayed responses- sometimes difficult to understand but repeats clearer Skin: + Scaling dried skin on bilateral heels  - 2 small excoriations, sore on the base of the 5th metatarsal head -no longer present - rash on face - no longer present - stage 2 to right toe -no longer present  + area of redness between left thumb and index finger-now minimal, purple bruise left buttock unstageable - not examined +redness to bilateral elbows -not appreciated + Left heel stage III DTI, covered in foam dressing  Neurologic: AAOx2,  to year but not month or date. Follows most simple commands.  + Hypophonation, speech intelligability much improved MAS 1 right elbow,MAS 1 in RIght finger and wrist flexors  MAS 3 knee flexors, and MAS 3 L>R  hip adductors  MAS 3 L elbow , MAS 3-4 in L wrist and finger flexors  + LUE WHO--- Hypersensitivity to left hand improved, +left hand contracture, exam stable 11/13  Motor 0/5 strength in LUE and LLE, showing volitional movements in right lower extremity but difficulty to obtain MMT due to her pain/braces, able to do upper body dressing now! Ambulated with RW! Exam stable 11/14  Twice daily for right upper extremity 1/5 finger flexion, otherwise no volitional movement of left upper extremity or bilateral lowers + BL UE WHO.    Musculoskeletal:  reduced bilateral shoulder and elbow ROM due to increased tone , pain with passive knee extension bilaterally   . GU: bladder incontinence  Assessment/Plan: 1. Functional deficits which require 3+ hours per day of interdisciplinary therapy in a comprehensive inpatient rehab setting. Physiatrist is providing close team supervision and 24 hour management of active medical problems listed below. Physiatrist and rehab team continue to assess barriers to discharge/monitor patient progress toward functional and medical goals  Care Tool:  Bathing    Body parts bathed by patient:  Face, Abdomen, Chest, Left upper leg, Right upper leg, Left arm, Front perineal area   Body parts bathed by helper: Right arm, Buttocks, Right lower leg, Left lower leg Body parts n/a: Right arm, Left arm, Front perineal area, Buttocks, Right upper leg, Left upper leg, Right lower leg, Left lower leg   Bathing assist Assist Level: Moderate Assistance - Patient 50 - 74%     Upper Body Dressing/Undressing Upper body dressing   What is the patient wearing?: Pull over shirt    Upper body assist Assist Level: Minimal Assistance - Patient > 75%    Lower Body Dressing/Undressing Lower body dressing      What is the patient wearing?: Incontinence brief, Pants     Lower body assist Assist for lower body dressing: Maximal Assistance - Patient 25 - 49%     Toileting Toileting Toileting Activity did not occur (Clothing management and hygiene only):  (incontient and required total for clean u)  Toileting assist Assist for toileting: Maximal Assistance - Patient 25 - 49%     Transfers Chair/bed transfer  Transfers assist  Chair/bed transfer activity did not occur: Safety/medical concerns  Chair/bed transfer assist level: Moderate Assistance - Patient 50 - 74%     Locomotion Ambulation   Ambulation assist   Ambulation activity did not occur: Safety/medical concerns  Assist level: 2 helpers   Max distance: 63ft   Walk 10 feet activity   Assist  Walk 10 feet activity did not occur: Safety/medical concerns  Assist level: 2 helpers     Walk 50 feet activity   Assist Walk 50 feet with 2 turns activity did not occur: Safety/medical concerns         Walk 150 feet activity   Assist Walk 150 feet activity did not occur: Safety/medical concerns         Walk 10 feet on uneven surface  activity   Assist Walk 10 feet on uneven surfaces activity did not occur: Safety/medical concerns         Wheelchair     Assist Is the patient using a wheelchair?:  Yes Type of Wheelchair: Manual    Wheelchair assist level: Dependent - Patient 0% Max wheelchair distance: 39ft    Wheelchair 50 feet with 2 turns activity    Assist    Wheelchair 50 feet with 2 turns activity did not occur: Safety/medical concerns   Assist Level: Dependent - Patient 0%   Wheelchair 150 feet activity     Assist  Wheelchair 150 feet activity did not occur: Safety/medical concerns   Assist Level: Dependent - Patient 0%   Blood pressure (!) 85/59, pulse 89, temperature 98.4 F (36.9 C), resp. rate 16, height 5\' 1"  (1.549 m), weight 51.8 kg, SpO2 97%.  Medical Problem List and Plan: 1. 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  Impaired mobility: continue Lovenox, weekly creatinine ordered             -antiplatelet therapy: N/A  3. Knee pain: Tylenol as needed, continue voltaren gel scheduled.   XR reviewed and is stable, discussed with therapy  4. History of anxiety: discussed with her mom that she feels this was a big risk factor for her accident, propanolol ordered. Continue magnesium supplement  5. Neuropsych/cognition: This patient is not capable of making decisions on her own behalf  - 10/12: Still not capable of medical decision making; BUT oriented and communicating, following directions - MUCH improved .Will add PRN melatonin 3mg  10/20 for insomnia  6. Left buttock unstageable ulcer.  Continue Medihoney to buttock wound daily cover with foam dressing.  Changing dressing every 3 days or as needed soiling   7. Fluids/Electrolytes/Nutrition: Routine in and outs with  follow-up chemistries  -Eating  well with cueing  8.  Cavitary right lower lobe pneumonia likely aspiration pneumonia/MRSA pneumonia.  Resolved    Latest Ref Rng & Units 02/05/2023   11:12 AM 01/22/2023    6:36 AM 12/20/2022   10:54 AM  CBC  WBC 4.0 - 10.5 K/uL 8.5  8.1  5.6   Hemoglobin 12.0 - 15.0 g/dL 59.5  63.8  75.6   Hematocrit 36.0 - 46.0 % 46.7  47.5  42.8   Platelets 150 - 400 K/uL 284  274  253      9.  History of drug abuse.  Positive cocaine on urine drug screen.  Provide counseling  10.  AKI/hypovolemia and ATN.  Resolved Appears to have fair fluid intake, creatinine has been stable  11/3- Push fluids due ot low BP  -03/24/23 Cr stable yesterday, BPs stable, monitor    Latest Ref Rng & Units 03/23/2023    5:11 AM 03/16/2023    6:53 AM 03/09/2023    6:13 AM  BMP  Creatinine 0.44 - 1.00 mg/dL 4.33  2.95  1.88     11.  Mild transaminitis with rhabdomyolysis.  Both resolved  12.  Hypotension. Resolved, d/c midodrine  -BP stable  11/2- BP was running 87 systolic last night- and max of 110 in last 24 hours- not dizzy per chart, but if develops any dizziness, suggest primary team restarts Midodrine. Amantadine can cause hypotension, FYI  11/3- No dizziness- BP on low side- will push fluids, but might restart Midodrine. If becomes yellow MEWS again, will restart midodrine today- d/w nursing  -11/9-10/24 BPs stable, monitor  Vitals:   03/25/23 1910 03/26/23 0355 03/26/23 1304 03/26/23 1631  BP: (!) 111/98 103/82 112/75 98/64   03/26/23 1940 03/27/23 0451 03/27/23 1507 03/27/23 1935  BP: 95/69 (!) 103/59 98/71 107/73   03/28/23 0438 03/28/23 1355 03/28/23 1909 03/29/23 0447  BP: 104/69 102/72 104/72 (!) 85/59    13. Impaired initiation: continue amantadine 100mg  BID  14. Hyperglycemia: CBGs mildly elevated, d/ced checks. Prosource d/ced as per patient's request  15. Spasticity:   -discussed serial casting with OT and her mom but she defers given pain she had when this  was attempted  -baclofen and tizanidine decreased due to hypotension, spasticity has been stable. Continue wrist, elbow brace, and PRAFOs. Ordered  a night splint for LLE  Repeat CMP today given chronic tizanidine use  -9/7 continues to have significant tone despite use of tizanidine and baclofen, may be beneficial consider baclofen pump in outpatient setting if this causes significant difficulties in care  9/8 consider decrease baclofen if dyskinetic movements continue  10/5-6  continue orthotics, ROM, positioning  when possible  16. Bowel and bladder incontinence: continue bowel and bladder program  17. Left hand skin maceration: Eucerin ordered, discussed hand split with OT, continue  18. History of magnesium deficiency: magnesium 500mg  started HS  19. HTN: see above Increase tizanidine to 6mg  TID--now 4mg  TID. Magnesium supplement added HS. Increase propanolol to 20mg  TID>> d/c'd, d/c clonidine patch given hypotension    03/29/2023    4:47 AM 03/28/2023    7:09 PM 03/28/2023    1:55 PM  Vitals with BMI  Systolic 85 104 102  Diastolic 59 72 72  Pulse 89 100 100     20. Area of redness between left thumb and index finger: continue foam dressing  21. Scalp eczema: selsun blue ordered, continue  -9/7 patient's mother feels like this is causing her to itch, will start some hydrocortisone cream  22. Fatigue: improved, discussed with team may be secondary to anti-spasticity medications, B12 reviewed and is 335, in suboptimal range, will give 1,000U B12 injection 8/7, will order monthly as needed, metanx started, increase to BID, continue  23. Right wrist swelling: XR ordered and shows no acute fracture, shows 2.20mm ulnar variance, brace removed to given patient a break from it. Voltaren gel ordered prn for discomfort, continue, CT ordered and show no acute osseus injury, continue ace wrap  23. Dysphagia: continue D3/thins--tolerating this diet  24. MRSA nares positive: negative on  repeat, precautions d/ced  25. Tachycardic: resolved, d/c propanolol.  continueTizanidine increased to 6mg  TID. Resolved, amantadine increased back to 100mg  BID   10/28 HR controlled in 80's  -11/9-10/24- HR controlled- con't regimen  26.  Dyskinetic movements in head and neck - appears to be myoclonic jerks related to hypoxic brain injury - as discussed with pt mom, no specific meds for this on antispasticity meds , consioder benzo although with pt's SUD would avoid this class  EEG reviewed and was negative for seizure.  Decrease baclofen to 15mg  TID>> down to 10mg  QHS  27. Small sore on the base of 5th metatarsal head: local wound care, continue  28. Rash on face: resolved, discussed with mother could have been secondary to added sugars in nutritional supplements  29. Sacral pressure injury: air mattress ordered, Placed nursing order to offload q1 hour while awake, continue medihoney, wound care consulted, discussed with nursing that this is improving  30. Cervical extensor weakness: tried decreasing tizanidine but spasticity appears increased and BP elevated so will increase back to 6mg  TID, continue this dose, continue cervical collar>> now on 4mg  TID  31. Low protein stores: d/c prosource since patient hates these and is eating 100% of meals  32. Intermittent lethargy: labs reviewed and are stable, discussed with team may be secondary to anti-spasticity medications  33. Blood blister to left heel: continue foam heel protectors.  34. Suboptimal vitamin D level: increase to 5,000 U daily. continue this dose  35. Hypotension: d/c propanolol, decrease baclofen to 10mg  HS, decrease tizanidine to 2mg  TID  36. Foot pain: tramadol ordered prn, improved today, continue prn  37. S/p fall: CT head reviewed and is stable  38. Headaches: continue tylenol and tramadol prn  39.  Bilateral foot drop: bilateral AFOs ordered, discussed that these can help her to walk better  40, Low back pain:  kpad ordered  41. Vaginal itching: clotrimazole ordered  42. Dysuria: UA/UC ordered  43. Whole body hypersensitivity: gabapentin 100mg  TID ordered    LOS: 131 days A FACE TO FACE EVALUATION WAS PERFORMED  Hend Mccarrell P Annlouise Gerety 03/29/2023, 1:03 PM

## 2023-03-29 NOTE — Plan of Care (Signed)
  Problem: RH Eating Goal: LTG Patient will perform eating w/assist, cues/equip (OT) Description: LTG: Patient will perform eating with assist, with/without cues using equipment (OT) Flowsheets (Taken 03/29/2023 0958) LTG: Pt will perform eating with assistance level of: (upgrading) Supervision/Verbal cueing   Problem: RH Grooming Goal: LTG Patient will perform grooming w/assist,cues/equip (OT) Description: LTG: Patient will perform grooming with assist, with/without cues using equipment (OT) Flowsheets (Taken 03/29/2023 0958) LTG: Pt will perform grooming with assistance level of: Minimal Assistance - Patient > 75%   Problem: RH Bathing Goal: LTG Patient will bathe all body parts with assist levels (OT) Description: LTG: Patient will bathe all body parts with assist levels (OT) Flowsheets (Taken 03/29/2023 0958) LTG: Pt will perform bathing with assistance level/cueing: (upgraded JLS) Minimal Assistance - Patient > 75%   Problem: RH Dressing Goal: LTG Patient will perform upper body dressing (OT) Description: LTG Patient will perform upper body dressing with assist, with/without cues (OT). Flowsheets (Taken 03/29/2023 0958) LTG: Pt will perform upper body dressing with assistance level of: (upgraded JLS) Contact Guard/Touching assist Note: upgraded JLS  Goal: LTG Patient will perform lower body dressing w/assist (OT) Description: LTG: Patient will perform lower body dressing with assist, with/without cues in positioning using equipment (OT) Flowsheets (Taken 03/29/2023 0958) LTG: Pt will perform lower body dressing with assistance level of: (upgraded JLS) Moderate Assistance - Patient 50 - 74%   Problem: RH Toileting Goal: LTG Patient will perform toileting task (3/3 steps) with assistance level (OT) Description: LTG: Patient will perform toileting task (3/3 steps) with assistance level (OT)  Flowsheets (Taken 03/29/2023 0958) LTG: Pt will perform toileting task (3/3 steps) with  assistance level: (upgraded JLS) Moderate Assistance - Patient 50 - 74%   Problem: RH Functional Use of Upper Extremity Goal: LTG Patient will use RT/LT upper extremity as a (OT) Description: LTG: Patient will use right/left upper extremity as a stabilizer/gross assist/diminished/nondominant/dominant level with assist, with/without cues during functional activity (OT) Flowsheets (Taken 03/29/2023 0958) LTG: Use of upper extremity in functional activities: LUE as diminished level LTG: Pt will use upper extremity in functional activity with assistance level of: Supervision/Verbal cueing   Problem: RH Toilet Transfers Goal: LTG Patient will perform toilet transfers w/assist (OT) Description: LTG: Patient will perform toilet transfers with assist, with/without cues using equipment (OT) Flowsheets (Taken 03/29/2023 0958) LTG: Pt will perform toilet transfers with assistance level of: (upgraded JLS) Minimal Assistance - Patient > 75% Note: upgraded JLS    Problem: RH Tub/Shower Transfers Goal: LTG Patient will perform tub/shower transfers w/assist (OT) Description: LTG: Patient will perform tub/shower transfers with assist, with/without cues using equipment (OT) Flowsheets (Taken 03/29/2023 0958) LTG: Pt will perform tub/shower stall transfers with assistance level of: (upgraded JLS) Minimal Assistance - Patient > 75% Note: upgraded JLS

## 2023-03-29 NOTE — Evaluation (Signed)
Recreational Therapy Assessment and Plan  Patient Details  Name: Nicole [name] MRN: [id number] Date of Birth: 1976-01-09 Today's Date: 03/29/2023  Rehab Potential:  good ELOS:   SNF?  Assessment Hospital Problem: Principal Problem:   Hypoxic brain injury [facility])     Past Medical History:      Past Medical History:  Diagnosis Date   Abnormal pap      w LEEP   Alcoholism (HCC)     Allergy     Anxiety     Asthma     Asthma     Blood in stool     Depression     Depression (emotion)     Frequent headaches     Hepatitis C, chronic (HCC)      hep c tx successful   Irritable bowel syndrome     OSA on CPAP - sees Dr. Jenne Campus in ENT 10/09/2013   Seasonal allergies      scratch test in 2006///mold issue in house        Past Surgical History:       Past Surgical History:  Procedure Laterality Date   DILATION AND CURETTAGE OF UTERUS   1996, 2001, 2002    x 3   LEEP   2008   NASAL FRACTURE SURGERY   1999    repair   NASAL SEPTOPLASTY W/ TURBINOPLASTY       TONSILLECTOMY              Assessment & Plan Clinical Impression: Patient is a 47 year old right-handed female with history of asthma as well as anxiety/depression, drug abuse. Per chart review patient lives with her mother. 1 level home with level entry. Family is planning assist as needed. Patient reportedly independent prior to admission. Presented 10/31/2022 after being found down in a field beside a condemned building. Patient unresponsive on arrival to the ED, hypoxic on 15 L nonrebreather mask. Required intubation for airway protection. Cranial CT scan showed abnormal symmetric edema with some bilateral globus paladi concerning for acute hypoxic/ischemic or metabolic/toxic injury. No mass effect. CT cervical spine negative. MRI of the brain symmetric signal abnormality within the globi pallidi most consistent with toxic metabolic injury. 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 Small amount of  bifrontal subarachnoid hemorrhage. EEG negative for seizure. CT of the chest abdomen pelvis showed right greater than left lower lobe consolidation with numerous associated centrilobular nodules and groundglass opacities. Trace right pleural effusion. No pneumothorax. Admission chemistries unremarkable except sodium 161 chloride 124 CO2 21 BUN 79 creatinine 2.57 AST 102 ALT 457 total bilirubin 2.2, WBC 43,600, lactic acid 4.4, CK3 109, BNP 140, urine drug screen positive cocaine, blood cultures no growth to date. Neurosurgery Dr. Jake Samples follow-up for diffuse cerebral edema in the setting of suspected acute hypoxic brain injury no neurosurgical intervention recommended. Infectious disease Dr. Elinor Parkinson consulted in regards to cavitary pneumonia and initially placed on broad-spectrum antibiotics since transitioned to Zyvox.. Leukocytosis much improved latest WBC 10,900 Lovenox added for DVT prophylaxis. Echocardiogram showed no valvular vegetation and ejection fraction of 60 to 65%. TEE not recommended at this time.. Patient was extubated 6/23. Tolerating mechanical soft diet. AKI with multiple electrolyte abnormalities most likely combination of hypovolemia and ATN placed on gentle IV fluids with latest creatinine normalized 0.64. Therapy evaluations completed due to patient decreased functional mobility and altered mental status was admitted for a comprehensive rehab program. Patient transferred to CIR on 11/18/2022  Pt presents with decreased activity tolerance, decreased functional  mobility, decreased balance, decreased midline orientation, decreased initiation, decreased attention, decreased awareness, decreased safety awareness, delayed processing Limiting pt's independence with leisure/community pursuits.  Met with pt today to discuss TR services including leisure education, activity analysis/modifications and stress management.  Also discussed the importance of social, emotional, spiritual health in addition  to physical health and their effects on overall health and wellness.  Pt stated understanding.   Plan  Min 1 TR session >20 minutes per week during LOS  Recommendations for other services: None   Discharge Criteria: Patient will be discharged from TR if patient refuses treatment 3 consecutive times without medical reason.  If treatment goals not met, if there is a change in medical status, if patient makes no progress towards goals or if patient is discharged from hospital.  The above assessment, treatment plan, treatment alternatives and goals were discussed and mutually agreed upon: by patient  Nicole [name] 03/29/2023, 3:52 PM

## 2023-03-30 LAB — CREATININE, SERUM
Creatinine, Ser: 0.74 mg/dL (ref 0.44–1.00)
GFR, Estimated: >60 mL/min (ref >60)

## 2023-03-30 NOTE — Progress Notes (Signed)
Occupational Therapy Session Note  Patient Details  Name: Nicole [name] MRN: [id number] Date of Birth: Jan 07, 1976  Today's Date: 03/30/2023 OT Individual Time: 2841-3244 OT Individual Time Calculation (min): 42 min    Short Term Goals: week 18: Pt will transfer to toilet with mod A with RW  Week 18: Pt will be able to come to EOB in prep for ADLs with min A  Pt will feed self 50% of meal with min A  Week 18: Pt will use external aids to be oriented x3 with mod A   Skilled Therapeutic Interventions/Progress Updates:  Pt greeted supine in bed, pt agreeable to OT intervention.  Pt requesting "thick" socks, retrieved blue bari socks.     Transfers/bed mobility: pt completed supine>sit with light MIN A to elevate trunk. Pt completed stand pivot to w/c with RW and MOD, assist needed to maneuver RW.   Therapeutic activity: pt able to engage in matching task with go fish cards. Pt able to complete task with 80% accuracy with MAX initial cues needed to problem solve task.    ADLs:  Grooming: pt completed seated oral care at sink with set- up assist.  UB dressing:pt donned OH shirt with CGA from EOB for balance support LB dressing: pt donned pants from bed level with MAX A with pt able to bridge to lift buttock up to pull pants to waist line.  Footwear: donned shoes/socks with MAX A from EOB    Ended session with pt seated in w/c with all needs within reach and safety belt alarm activated.                    Therapy Documentation Precautions:  Precautions Precautions: Fall Precaution Comments: delayed processing, Increased tone UB and LB Required Braces or Orthoses: Other Brace Other Brace: B adjustable night splints; B elbow "cosey" splints; L palm protector; R mitt Restrictions Weight Bearing Restrictions: No  Pain: Unrated pain reported in BLEs, pt unable to localize pain but provided rest breaks and repositioning as needed.    Therapy/Group: Individual Therapy  Barron Schmid 03/30/2023, 12:16 PM

## 2023-03-30 NOTE — Progress Notes (Signed)
Occupational Therapy Session Note  Patient Details  Name: Nicole [name] MRN: [id number] Date of Birth: 1975-12-09  {CHL IP REHAB OT TIME CALCULATIONS:[id number]}   Short Term Goals: Week 18: OT Short Term Goal 1  Pt will transfer to toilet with mod A with RW OT Short Term Goal 2  Pt will be able to come to EOB in prep for ADLs with min A OT Short Term Goal 3 : Pt will feed self 50% of meal with min A OT Short Term Goal 4  Pt will use external aids to be oriented x3 with mod A  Skilled Therapeutic Interventions/Progress Updates:    Patient agreeable to participate in OT session. Reports *** pain level.   Patient participated in skilled OT session focusing on ***. Therapist facilitated/assessed/developed/educated/integrated/elicited *** in order to improve/facilitate/promote    Therapy Documentation Precautions:  Precautions Precautions: Fall Precaution Comments: delayed processing, Increased tone UB and LB Required Braces or Orthoses: Other Brace Other Brace: B adjustable night splints; B elbow "cosey" splints; L palm protector; R mitt Restrictions Weight Bearing Restrictions: No  Therapy/Group: Individual Therapy  Limmie Patricia, OTR/L,CBIS  Supplemental OT - [facility] and [facility] Secure Chat Preferred   03/30/2023, 10:20 PM

## 2023-03-30 NOTE — Progress Notes (Signed)
PROGRESS NOTE   Subjective/Complaints: No new complaints this morning Still has vaginal itching Discussed that UA is negative, clotrimazole was started   ROS:  Pt denies SOB, abd pain, CP, N/V/C/D, and vision changes  . +bladder incontinence, +migraines- well controlled, +spasticity- stable, +impaired ambulation, +low back pain, +whole body hypersensitivity, +vaginal itching  Objective:   No results found. No results for input(s): "WBC", "HGB", "HCT", "PLT" in the last 72 hours.     Recent Labs    03/30/23 0535  CREATININE 0.74       Intake/Output Summary (Last 24 hours) at 03/30/2023 1100 Last data filed at 03/30/2023 0800 Gross per 24 hour  Intake 356 ml  Output 400 ml  Net -44 ml       Pressure Injury 02/09/23 Heel Left Deep Tissue Pressure Injury - Purple or maroon localized area of discolored intact skin or blood-filled blister due to damage of underlying soft tissue from pressure and/or shear. Black/purple and boggy,  size of a quar (Active)  02/09/23 0543  Location: Heel  Location Orientation: Left  Staging: Deep Tissue Pressure Injury - Purple or maroon localized area of discolored intact skin or blood-filled blister due to damage of underlying soft tissue from pressure and/or shear.  Wound Description (Comments): Black/purple and boggy,  size of a quarter.  Foam dressing applied  Present on Admission: No    Physical Exam: Vital Signs Blood pressure 100/67, pulse 78, temperature 98 F (36.7 C), resp. rate 16, height 5\' 1"  (1.549 m), weight 51.8 kg, SpO2 97%.     General: awake, alert, resting in bed; NAD HENT: conjugate gaze; oropharynx moist, dons glasses independently CV: regular rate and rhythm; no JVD Pulmonary: CTA B/L; no W/R/R- good air movement GI: soft, NT, ND, (+)BS Psychiatric: engaging, interactive, impulsive at times Neurological: delayed responses- sometimes difficult to  understand but repeats clearer Skin: + Scaling dried skin on bilateral heels  - 2 small excoriations, sore on the base of the 5th metatarsal head -no longer present - rash on face - no longer present - stage 2 to right toe -no longer present  + area of redness between left thumb and index finger-now minimal, purple bruise left buttock unstageable - not examined +redness to bilateral elbows -not appreciated + Left heel stage III DTI, covered in foam dressing  Neurologic: AAOx2,  to year but not month or date. Follows most simple commands.  + Hypophonation, speech intelligability much improved MAS 1 right elbow,MAS 1 in RIght finger and wrist flexors  MAS 3 knee flexors, and MAS 3 L>R  hip adductors  MAS 3 L elbow , MAS 3-4 in L wrist and finger flexors  + LUE WHO--- Hypersensitivity to left hand improved, +left hand contracture, exam stable 11/5  Motor 0/5 strength in LUE and LLE, showing volitional movements in right lower extremity but difficulty to obtain MMT due to her pain/braces, able to do upper body dressing now! Ambulated with RW! Exam stable 11/14  Twice daily for right upper extremity 1/5 finger flexion, otherwise no volitional movement of left upper extremity or bilateral lowers + BL UE WHO.    Musculoskeletal: reduced bilateral shoulder and elbow ROM due  to increased tone , pain with passive knee extension bilaterally   . GU: bladder incontinence  Assessment/Plan: 1. Functional deficits which require 3+ hours per day of interdisciplinary therapy in a comprehensive inpatient rehab setting. Physiatrist is providing close team supervision and 24 hour management of active medical problems listed below. Physiatrist and rehab team continue to assess barriers to discharge/monitor patient progress toward functional and medical goals  Care Tool:  Bathing    Body parts bathed by patient: Face, Abdomen, Chest, Left upper leg, Right upper leg, Left arm, Front perineal area    Body parts bathed by helper: Right arm, Buttocks, Right lower leg, Left lower leg Body parts n/a: Right arm, Left arm, Front perineal area, Buttocks, Right upper leg, Left upper leg, Right lower leg, Left lower leg   Bathing assist Assist Level: Moderate Assistance - Patient 50 - 74%     Upper Body Dressing/Undressing Upper body dressing   What is the patient wearing?: Pull over shirt    Upper body assist Assist Level: Minimal Assistance - Patient > 75%    Lower Body Dressing/Undressing Lower body dressing      What is the patient wearing?: Incontinence brief, Pants     Lower body assist Assist for lower body dressing: Maximal Assistance - Patient 25 - 49%     Toileting Toileting Toileting Activity did not occur (Clothing management and hygiene only):  (incontient and required total for clean u)  Toileting assist Assist for toileting: Maximal Assistance - Patient 25 - 49%     Transfers Chair/bed transfer  Transfers assist  Chair/bed transfer activity did not occur: Safety/medical concerns  Chair/bed transfer assist level: Moderate Assistance - Patient 50 - 74%     Locomotion Ambulation   Ambulation assist   Ambulation activity did not occur: Safety/medical concerns  Assist level: Moderate Assistance - Patient 50 - 74% Assistive device: Walker-rolling Max distance: 55ft   Walk 10 feet activity   Assist  Walk 10 feet activity did not occur: Safety/medical concerns  Assist level: Moderate Assistance - Patient - 50 - 74% Assistive device: Walker-rolling   Walk 50 feet activity   Assist Walk 50 feet with 2 turns activity did not occur: Safety/medical concerns         Walk 150 feet activity   Assist Walk 150 feet activity did not occur: Safety/medical concerns         Walk 10 feet on uneven surface  activity   Assist Walk 10 feet on uneven surfaces activity did not occur: Safety/medical concerns         Wheelchair     Assist Is the  patient using a wheelchair?: Yes Type of Wheelchair: Manual    Wheelchair assist level: Dependent - Patient 0% Max wheelchair distance: 21ft    Wheelchair 50 feet with 2 turns activity    Assist    Wheelchair 50 feet with 2 turns activity did not occur: Safety/medical concerns   Assist Level: Dependent - Patient 0%   Wheelchair 150 feet activity     Assist  Wheelchair 150 feet activity did not occur: Safety/medical concerns   Assist Level: Dependent - Patient 0%   Blood pressure 100/67, pulse 78, temperature 98 F (36.7 C), resp. rate 16, height 5\' 1"  (1.549 m), weight 51.8 kg, SpO2 97%.  Medical Problem List and Plan: 1. 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  Impaired mobility: continue Lovenox, weekly creatinine ordered             -antiplatelet therapy: N/A  3. Knee pain: Tylenol as needed, continue voltaren gel scheduled.   XR reviewed and is stable, discussed with therapy  4. History of anxiety: discussed with her mom that she feels this was a big risk factor for her accident, propanolol ordered. Continue magnesium supplement  5. Neuropsych/cognition: This patient is not capable of making decisions on her own behalf  - 10/12: Still not capable of medical decision making; BUT oriented and communicating, following directions - MUCH improved .Will add PRN melatonin 3mg  10/20 for insomnia  6. Left buttock unstageable ulcer.  Continue Medihoney to buttock wound daily cover with foam dressing.  Changing dressing every 3 days or as needed soiling   7. Fluids/Electrolytes/Nutrition:  Routine in and outs with follow-up chemistries  -Eating  well with cueing  8.  Cavitary right lower lobe pneumonia likely aspiration pneumonia/MRSA pneumonia.  Resolved    Latest Ref Rng & Units 02/05/2023   11:12 AM 01/22/2023    6:36 AM 12/20/2022   10:54 AM  CBC  WBC 4.0 - 10.5 K/uL 8.5  8.1  5.6   Hemoglobin 12.0 - 15.0 g/dL 08.6  57.8  46.9   Hematocrit 36.0 - 46.0 % 46.7  47.5  42.8   Platelets 150 - 400 K/uL 284  274  253      9.  History of drug abuse.  Positive cocaine on urine drug screen.  Provide counseling  10.  AKI/hypovolemia and ATN.  Resolved Appears to have fair fluid intake, creatinine has been stable  11/3- Push fluids due ot low BP  -03/24/23 Cr stable yesterday, BPs stable, monitor    Latest Ref Rng & Units 03/30/2023    5:35 AM 03/23/2023    5:11 AM 03/16/2023    6:53 AM  BMP  Creatinine 0.44 - 1.00 mg/dL 6.29  5.28  4.13     11.  Mild transaminitis with rhabdomyolysis.  Both resolved  12.  Hypotension. Resolved, d/c midodrine  -BP stable  11/2- BP was running 87 systolic last night- and max of 110 in last 24 hours- not dizzy per chart, but if develops any dizziness, suggest primary team restarts Midodrine. Amantadine can cause hypotension, FYI  11/3- No dizziness- BP on low side- will push fluids, but might restart Midodrine. If becomes yellow MEWS again, will restart midodrine today- d/w nursing  -11/9-10/24 BPs stable, monitor  Vitals:   03/26/23 1631 03/26/23 1940 03/27/23 0451 03/27/23 1507  BP: 98/64 95/69 (!) 103/59 98/71   03/27/23 1935 03/28/23 0438 03/28/23 1355 03/28/23 1909  BP: 107/73 104/69 102/72 104/72   03/29/23 0447 03/29/23 1343 03/29/23 1951 03/30/23 0441  BP: (!) 85/59 (!) 89/59 97/63 100/67    13. Impaired initiation: continue amantadine 100mg  BID  14. Hyperglycemia: CBGs mildly elevated, d/ced checks. Prosource d/ced as per patient's request  15. Spasticity:   -discussed serial casting with OT and her mom but she defers given  pain she had when this was attempted  -baclofen and tizanidine decreased due to hypotension, spasticity has been stable. Continue wrist,  elbow brace, and PRAFOs. Ordered a night splint for LLE  Repeat CMP today given chronic tizanidine use  -9/7 continues to have significant tone despite use of tizanidine and baclofen, may be beneficial consider baclofen pump in outpatient setting if this causes significant difficulties in care  9/8 consider decrease baclofen if dyskinetic movements continue  10/5-6  continue orthotics, ROM, positioning  when possible  16. Bowel and bladder incontinence: continue bowel and bladder program  17. Left hand skin maceration: Eucerin ordered, discussed hand split with OT, continue  18. History of magnesium deficiency: magnesium 500mg  started HS  19. HTN: see above Increase tizanidine to 6mg  TID--now 4mg  TID. Magnesium supplement added HS. Increase propanolol to 20mg  TID>> d/c'd, d/c clonidine patch given hypotension    03/30/2023    4:41 AM 03/29/2023    7:51 PM 03/29/2023    1:43 PM  Vitals with BMI  Systolic 100 97 89  Diastolic 67 63 59  Pulse 78 87 84     20. Area of redness between left thumb and index finger: continue foam dressing  21. Scalp eczema: selsun blue ordered, continue  -9/7 patient's mother feels like this is causing her to itch, will start some hydrocortisone cream  22. Fatigue: improved, discussed with team may be secondary to anti-spasticity medications, B12 reviewed and is 335, in suboptimal range, will give 1,000U B12 injection 8/7, will order monthly as needed, metanx started, increase to BID, continue  23. Right wrist swelling: XR ordered and shows no acute fracture, shows 2.87mm ulnar variance, brace removed to given patient a break from it. Voltaren gel ordered prn for discomfort, continue, CT ordered and show no acute osseus injury, continue ace wrap  23. Dysphagia: continue D3/thins--tolerating this diet  24. MRSA nares  positive: negative on repeat, precautions d/ced  25. Tachycardic: resolved, d/c propanolol.  continueTizanidine increased to 6mg  TID. Resolved, amantadine increased back to 100mg  BID   10/28 HR controlled in 80's  -11/9-10/24- HR controlled- con't regimen  26.  Dyskinetic movements in head and neck - appears to be myoclonic jerks related to hypoxic brain injury - as discussed with pt mom, no specific meds for this on antispasticity meds , consioder benzo although with pt's SUD would avoid this class  EEG reviewed and was negative for seizure.  Decrease baclofen to 15mg  TID>> down to 10mg  QHS  27. Small sore on the base of 5th metatarsal head: local wound care, continue  28. Rash on face: resolved, discussed with mother could have been secondary to added sugars in nutritional supplements  29. Sacral pressure injury: air mattress ordered, Placed nursing order to offload q1 hour while awake, continue medihoney, wound care consulted, discussed with nursing that this is improving  30. Cervical extensor weakness: tried decreasing tizanidine but spasticity appears increased and BP elevated so will increase back to 6mg  TID, continue this dose, continue cervical collar>> now on 4mg  TID  31. Low protein stores: d/c prosource since patient hates these and is eating 100% of meals  32. Intermittent lethargy: labs reviewed and are stable, discussed with team may be secondary to anti-spasticity medications  33. Blood blister to left heel: continue foam heel protectors.  34. Suboptimal vitamin D level: increase to 5,000 U daily. continue this dose  35. Hypotension: d/c propanolol, decrease baclofen to 10mg  HS, decrease tizanidine to 2mg  TID  36. Foot pain: tramadol ordered prn, improved today, continue prn  37. S/p fall: CT head reviewed and is stable  38. Headaches: continue tylenol  and tramadol prn  39. Bilateral foot drop: bilateral AFOs ordered, discussed that these can help her to walk  better  40, Low back pain: continue kpad  41. Vaginal itching: clotrimazole ordered, discussed with patient  42. Dysuria: UA/UC ordered, discussed negative  43. Whole body hypersensitivity: gabapentin 100mg  TID ordered    LOS: 132 days A FACE TO FACE EVALUATION WAS PERFORMED  Nicole [name] P Sairah Knobloch 03/30/2023, 11:00 AM

## 2023-03-30 NOTE — Progress Notes (Signed)
Orthopedic Tech Progress Note Patient Details:  Nicole [name] 17-Apr-1976 [id number]  Patient received a pair of custom of SMALL PRAFO BOOTS back in July. Patient has been in the same room the whole time so the bots should be in there   Patient ID: Nicole [name], female   DOB: Jun 14, 1975, 47 y.o.   MRN: [id number]  [name] 03/30/2023, 7:36 PM

## 2023-03-30 NOTE — Progress Notes (Signed)
Speech Language Pathology Daily Session Note  Patient Details  Name: Nicole [name] MRN: [id number] Date of Birth: [birth year]  Today's Date: 03/30/2023 SLP Individual Time: 1000-1035 SLP Individual Time Calculation (min): 35 min Session 1: 1000-1100 Session 2: 1430-1515  Short Term Goals: Week 9: SLP Short Term Goal 1 (Week 9): Week 19 - Pt will solve basic to mildly complex problems with 90% accuracy given min assist. SLP Short Term Goal 2 (Week 9): Week 19 -Patient will utilize external aids for orientation to place and time with supervision level verbal cues SLP Short Term Goal 3 (Week 9): Week 19 - Pt will demonstrate sustained attention to basic tasks for 15 minutes with Min A multimodal cues for redirection. SLP Short Term Goal 4 (Week 9): Week 19 - Pt will utilize increased vocal intensity w/ supervision verbal cues to remain 90% intelligibile at sentence level  Skilled Therapeutic Interventions:  Session 1: 1000-1100 Skilled therapy session focused on cognitive goals. SLP facilitated session by providing minA for use of external aids during orientation task. With use of calendar, patient able to recall month, year, date and day of the week. SLP addressed problem solving goals through providing modA during abstract card task. Patient required modA throughout to follow rules of game. During completion of abstract game, patient put head on bed and refused further participation stating "her head hurt" and she was tired. RN aware. Patient refused further treatment this session. Patient left in chair with alarm set and call bell in reach. Continue POC.  Session 2: 9562-1308 Skilled therapy session focused on cognitive goals. SLP faciliated session by providing minA to encourage patient to name various music genres and artists. Patient required minA to name genres, however then able to name artists and songs she was familiar with. Patient more attentive this session likely due to love for music.  Observed patient consume D3 textures solids with no s/sx of aspiration and adequate oral clearance. Recommend continuation of current diet. Patient left in bed with call bell in hand and alarm set. Continue POC.   Pain Session 1: Patient reports headache and fatigue; nurse notified.  Session 2: No pain reported   Therapy/Group: Individual Therapy  Alton Tremblay M.A., CF-SLP 03/30/2023, 7:39 AM

## 2023-03-30 NOTE — Progress Notes (Signed)
Patient ID: Nicole [name], female   DOB: 02/14/1976, 47 y.o.   MRN: [id number] Message left for [facility] regarding update. Also called [facility] regarding status of SSD. Continuing to try to get a bed for pt and have not had success. Pt is making progress in her therapies and staring to walk now. Has been increased back to three hours per day of therapies due to can participate fully now. Mom aware of all of this and team conference update. Plan to see today when here

## 2023-03-30 NOTE — Progress Notes (Signed)
Physical Therapy Session Note  Patient Details  Name: Nicole [name] MRN: [id number] Date of Birth: 1975-10-25  Today's Date: 03/30/2023 PT Individual Time: 1300-1356 PT Individual Time Calculation (min): 56 min   Short Term Goals: Week 1:  PT Short Term Goal 1 (Week 1): Pt will ambulate 7ft with modA and LRAD PT Short Term Goal 1 - Progress (Week 1): Not met PT Short Term Goal 2 (Week 1): Pt will complete functional outcome measure to assess balance and falls risk PT Short Term Goal 2 - Progress (Week 1): Not met PT Short Term Goal 3 (Week 1): Week 13: Pt will complete bed<>chair transfers with totalA (pt <25%) PT Short Term Goal 3 - Progress (Week 1): Not met  Skilled Therapeutic Interventions/Progress Updates:        Pt presents sitting in w/c with her mother at the bedside. Patient reports having a sore buttock after sitting upright in w/c all day. Mobility and repositioning provided as pain management.   Transported to main rehab gym at w/c level for time.   Donned B AFO's (GRAFO Thusane Sprystep) to trial.   Sit<>stand to RW from w/c with min/modA with cues for placement of her RLE as she tends to tuck it too far underneath her when she attempts to stand.   Gait training ~25ft + ~39ft + ~21ft + ~67ft during session with rest breaks as needed. modA and RW used. ModA for RW management and trunk support. Used visual cues to give her more of a upright posture. Initially, she complains of L shin pain from the AFO - repositioned her pants/socks to improve comfort/sensitivity which seemed to help. GRAFO's help with foot clearance and due promote knee extension for her but concerned it may be too aggressive - will continue to monitor and communicate with [facility] for patient needs.   Worked on posture and stretching in standing with RW support. Had patient stand with back against a wall and a mirror in front of her for visual aid. Encouraged upright posture and trunk elongation, cervical  extension, and B knee extension to tolerance. Static standing with minA and RW with her back supported by the wall.   Returned to her room and patient assisted back to bed with modA stand pivot transfer. Removed GRAFO and tennis shoes and left flat in bed with all needs met. Alarm on.      Therapy Documentation Precautions:  Precautions Precautions: Fall Precaution Comments: delayed processing, Increased tone UB and LB Required Braces or Orthoses: Other Brace Other Brace: B adjustable night splints; B elbow "cosey" splints; L palm protector; R mitt Restrictions Weight Bearing Restrictions: No General:      Therapy/Group: Individual Therapy  Saajan Willmon P [name] 03/30/2023, 7:50 AM

## 2023-03-31 DIAGNOSIS — K5900 Constipation, unspecified: Secondary | ICD-10-CM

## 2023-03-31 NOTE — Progress Notes (Signed)
Physical Therapy Session Note  Patient Details  Name: Nicole [name] MRN: [id number] Date of Birth: 1975/11/13  Today's Date: 03/31/2023 PT Individual Time: 1305-1402 PT Individual Time Calculation (min): 57 min   Short Term Goals: Week 1:  PT Short Term Goal 1 (Week 1): Pt will ambulate 55ft with modA and LRAD PT Short Term Goal 1 - Progress (Week 1): Not met PT Short Term Goal 2 (Week 1): Pt will complete functional outcome measure to assess balance and falls risk PT Short Term Goal 2 - Progress (Week 1): Not met PT Short Term Goal 3 (Week 1): Week 13: Pt will complete bed<>chair transfers with totalA (pt <25%) PT Short Term Goal 3 - Progress (Week 1): Not met  Skilled Therapeutic Interventions/Progress Updates:    Pt received supine in bed awake having just finished lunch and agreeable to therapy session.   In supine, pt with L LE hypertonia with muscle spasms in either extensor synergy or flexor synergy depending on positioning of the leg and pt reporting pain with these muscle spasms.  Supine>sitting L EOB, HOB elevated and using bedrail, with mod A for B LE management and trunk upright.   Sitting EOB, donned B LE Sprystep Max GRAFOs and shoes total A with +2 guarding trunk control due to posterior LOB requiring intermittent min A to recover - occasionally pt able to initiate trunk correction with verbal cuing.   R squat pivot EOB>w/c with pt having onset of pain in B LEs (R>L) with appearing muscle spasms and pt avoiding therapist rotating her hips towards the R requiring heavy mod/max A to lift and pivot her hips.    Transported to/from gym in w/c for time management and energy conservation.  Sit<>stands w/c>RW with pt requiring heavy mod to max A for lifting to stand due to impaired motor planning causing pt to have strong posterior lean - frequently pt with bring R LE back underneath BOS too far requiring cuing to correct - doesn't bring L LE back far enough - lacks adequate  anterior trunk lean and forward weight shift - likes to place both hands up on RW, sometimes will push up with R hand from armrest. Throughout session, with repetition, pt improves in anterior weight shift and motor planning to come to standing.   Attempted gait training ~3x with RW, but pt unable to take more than ~3steps before buckling through B LEs and requiring close wheelchair follow to catch her when she goes to sit because pt unable to remain standing despite max A. Pt reporting significant pain from R LE GRAFO, so removed it and had pt return to standing with no noticeable knee buckling (and with GRAFO appears R knee was forced into extension too much).   Transitioned to gait training using Fara Boros with L GRAFO still in place: - 54ft x2 + 39ft (seated breaks) with close +2 w/c follow - continues to have excessive forward trunk flexed posture with downward gaze  - noticed during L swing phase that pt has L cervical rotation  - pt able to keep R knee extended during stance with no buckling until towards end when becoming fatigued then has gradually worsening crouch  - pt with adequate R foot clearance and step length during swing - L LE stays partially flexed throughout entirety of gait, would have min improvement in knee extension during stance if pt is able to take adequate step length during swing to achieve closer to a heel strike  - L LE  foot drags during swing benefiting from verbal cuing to achieve positive/reciprocal step  - narrow BOS throughout - significantly decreased gait speed  Pt very excited about her achievement with walking today!  Transported back to room.   L squat pivot w/c>EOB with mod assist for lifting/pivoting hips with improvement in ability to transfer this direction.   Sit>supine with mod A for trunk descent and B LE management onto bed. SLP arriving early for session and pt left in their care.    Therapy Documentation Precautions:   Precautions Precautions: Fall Precaution Comments: delayed processing, Increased tone UB and LB Required Braces or Orthoses: Other Brace Other Brace: B adjustable night splints; B elbow "cosey" splints; L palm protector; R mitt Restrictions Weight Bearing Restrictions: No   Pain: Reports R shin pain due to GRAFO, removed it.  Reports bilateral heel pain when sliding on/off shoes - modified technique for pain management.  Pain with L LE hypertonia when it spasms into extensor vs flexor synergy.    Therapy/Group: Individual Therapy  Ginny Forth , PT, DPT, NCS, CSRS 03/31/2023, 12:20 PM

## 2023-03-31 NOTE — Progress Notes (Signed)
Speech Language Pathology Daily Session Note  Patient Details  Name: MILAH SIEBEL MRN: [id number] Date of Birth: Oct 29, 1975  Today's Date: 03/31/2023 SLP Individual Time: 7829-5621 SLP Individual Time Calculation (min): 43 min  Short Term Goals: Week 9: SLP Short Term Goal 1 (Week 9): Week 19 - Pt will solve basic to mildly complex problems with 90% accuracy given min assist. SLP Short Term Goal 2 (Week 9): Week 19 -Patient will utilize external aids for orientation to place and time with supervision level verbal cues SLP Short Term Goal 3 (Week 9): Week 19 - Pt will demonstrate sustained attention to basic tasks for 15 minutes with Min A multimodal cues for redirection. SLP Short Term Goal 4 (Week 9): Week 19 - Pt will utilize increased vocal intensity w/ supervision verbal cues to remain 90% intelligibile at sentence level  Skilled Therapeutic Interventions: Skilled therapy session focused on cognitive and speech intelligibility goals. SLP facilitated session by providing minA during abstract problem solving activity. SLP provided definitions of words encompassing the theme of "autumn." Patient able to provide vocabulary word with associated definition given minA. SLP continued to address problem solving through abstract word task. Patient was prompted to name five letter words and utilize letter cues to determine "target" word. Patient completed task with minA. SLP addressed communication through providing minA for patient to "speak louder" while she was ordering meals and conversing during session time. Patient ~90% intelligible given minA. Patient left in bed with alarm set and call bell in reach. Continue POC.    Pain Pain Assessment Pain Scale: 0-10 Pain Score: 0-No pain  Therapy/Group: Individual Therapy  Teagon Kron M.A., CF-SLP 03/31/2023, 12:35 PM

## 2023-03-31 NOTE — Progress Notes (Addendum)
PROGRESS NOTE   Subjective/Complaints:  Pt doing ok, but states she didn't sleep well. Having some left sided pain-- but pain manageable though, just asked for meds, which usually help. Unsure of LBM, thought yesterday but last documented was 03/28/23. States she's urinating fine, denies any other complaints or concerns today.    ROS:  Pt denies SOB, abd pain, CP, N/V/D, and vision changes  +bladder incontinence, +migraines- well controlled, +spasticity- stable, +impaired ambulation, +low back pain, +whole body hypersensitivity, +vaginal itching  Objective:   No results found. No results for input(s): "WBC", "HGB", "HCT", "PLT" in the last 72 hours.     Recent Labs    03/30/23 0535  CREATININE 0.74       Intake/Output Summary (Last 24 hours) at 03/31/2023 1220 Last data filed at 03/31/2023 0800 Gross per 24 hour  Intake 894 ml  Output --  Net 894 ml       Pressure Injury 02/09/23 Heel Left Deep Tissue Pressure Injury - Purple or maroon localized area of discolored intact skin or blood-filled blister due to damage of underlying soft tissue from pressure and/or shear. Black/purple and boggy,  size of a quar (Active)  02/09/23 0543  Location: Heel  Location Orientation: Left  Staging: Deep Tissue Pressure Injury - Purple or maroon localized area of discolored intact skin or blood-filled blister due to damage of underlying soft tissue from pressure and/or shear.  Wound Description (Comments): Black/purple and boggy,  size of a quarter.  Foam dressing applied  Present on Admission: No    Physical Exam: Vital Signs Blood pressure 99/67, pulse 75, temperature 98.3 F (36.8 C), resp. rate 16, height 5\' 1"  (1.549 m), weight 51.8 kg, SpO2 96%.   General: awake, alert, resting in bed; NAD HENT: conjugate gaze; oropharynx moist, dons glasses independently CV: regular rate and rhythm; no JVD Pulmonary: CTA B/L; no  W/R/R- good air movement GI: soft, NT, ND, (+)BS Psychiatric: engaging, interactive, impulsive at times Neurological: delayed responses- sometimes difficult to understand but repeats clearer  PRIOR EXAMS: Skin: + Scaling dried skin on bilateral heels  - 2 small excoriations, sore on the base of the 5th metatarsal head -no longer present - rash on face - no longer present - stage 2 to right toe -no longer present  + area of redness between left thumb and index finger-now minimal, purple bruise left buttock unstageable - not examined +redness to bilateral elbows -not appreciated + Left heel stage III DTI, covered in foam dressing  Neurologic: AAOx2,  to year but not month or date. Follows most simple commands.  + Hypophonation, speech intelligability much improved MAS 1 right elbow,MAS 1 in RIght finger and wrist flexors  MAS 3 knee flexors, and MAS 3 L>R  hip adductors  MAS 3 L elbow , MAS 3-4 in L wrist and finger flexors  + LUE WHO--- Hypersensitivity to left hand improved, +left hand contracture, exam stable 11/5  Motor 0/5 strength in LUE and LLE, showing volitional movements in right lower extremity but difficulty to obtain MMT due to her pain/braces, able to do upper body dressing now! Ambulated with RW! Exam stable 11/14  Twice daily for right upper  extremity 1/5 finger flexion, otherwise no volitional movement of left upper extremity or bilateral lowers + BL UE WHO.    Musculoskeletal: reduced bilateral shoulder and elbow ROM due to increased tone , pain with passive knee extension bilaterally   . GU: bladder incontinence  Assessment/Plan: 1. Functional deficits which require 3+ hours per day of interdisciplinary therapy in a comprehensive inpatient rehab setting. Physiatrist is providing close team supervision and 24 hour management of active medical problems listed below. Physiatrist and rehab team continue to assess barriers to discharge/monitor patient progress toward  functional and medical goals  Care Tool:  Bathing    Body parts bathed by patient: Face, Abdomen, Chest, Left upper leg, Right upper leg, Left arm, Front perineal area   Body parts bathed by helper: Right arm, Buttocks, Right lower leg, Left lower leg Body parts n/a: Right arm, Left arm, Front perineal area, Buttocks, Right upper leg, Left upper leg, Right lower leg, Left lower leg   Bathing assist Assist Level: Moderate Assistance - Patient 50 - 74%     Upper Body Dressing/Undressing Upper body dressing   What is the patient wearing?: Pull over shirt    Upper body assist Assist Level: Contact Guard/Touching assist    Lower Body Dressing/Undressing Lower body dressing      What is the patient wearing?: Incontinence brief, Pants     Lower body assist Assist for lower body dressing: Maximal Assistance - Patient 25 - 49%     Toileting Toileting Toileting Activity did not occur (Clothing management and hygiene only):  (incontient and required total for clean u)  Toileting assist Assist for toileting: Maximal Assistance - Patient 25 - 49%     Transfers Chair/bed transfer  Transfers assist  Chair/bed transfer activity did not occur: Safety/medical concerns  Chair/bed transfer assist level: Moderate Assistance - Patient 50 - 74%     Locomotion Ambulation   Ambulation assist   Ambulation activity did not occur: Safety/medical concerns  Assist level: Moderate Assistance - Patient 50 - 74% Assistive device: Walker-rolling Max distance: 78ft   Walk 10 feet activity   Assist  Walk 10 feet activity did not occur: Safety/medical concerns  Assist level: Moderate Assistance - Patient - 50 - 74% Assistive device: Walker-rolling   Walk 50 feet activity   Assist Walk 50 feet with 2 turns activity did not occur: Safety/medical concerns         Walk 150 feet activity   Assist Walk 150 feet activity did not occur: Safety/medical concerns         Walk 10  feet on uneven surface  activity   Assist Walk 10 feet on uneven surfaces activity did not occur: Safety/medical concerns         Wheelchair     Assist Is the patient using a wheelchair?: Yes Type of Wheelchair: Manual    Wheelchair assist level: Dependent - Patient 0% Max wheelchair distance: 41ft    Wheelchair 50 feet with 2 turns activity    Assist    Wheelchair 50 feet with 2 turns activity did not occur: Safety/medical concerns   Assist Level: Dependent - Patient 0%   Wheelchair 150 feet activity     Assist  Wheelchair 150 feet activity did not occur: Safety/medical concerns   Assist Level: Dependent - Patient 0%   Blood pressure 99/67, pulse 75, temperature 98.3 F (36.8 C), resp. rate 16, height 5\' 1"  (1.549 m), weight 51.8 kg, SpO2 96%.  Medical Problem List and Plan:  1. 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  Impaired mobility: continue Lovenox 30mg  daily, weekly creatinine ordered             -antiplatelet therapy: N/A  3. Pain:  -Knee pain: Tylenol as needed, continue voltaren gel scheduled. XR reviewed and is stable, discussed with therapy -Right wrist swelling: XR ordered and shows no acute fracture, shows 2.107mm ulnar variance, brace removed to given patient a break from it. Voltaren gel ordered prn for discomfort, continue, CT ordered and show no acute osseus injury, continue ace wrap   -Foot pain: tramadol ordered prn, improved today, continue prn  -Headaches: continue tylenol and tramadol prn -Low back pain: continue kpad -Whole body hypersensitivity:  gabapentin 100mg  TID ordered  4. History of anxiety: discussed with her mom that she feels this was a big risk factor for her accident, propanolol ordered. Continue magnesium supplement  5. Neuropsych/cognition: This patient is not capable of making decisions on her own behalf  - 10/12: Still not capable of medical decision making; BUT oriented and communicating, following directions - MUCH improved -Will add PRN melatonin 3mg  10/20 for insomnia  6. Skin: -Left buttock unstageable ulcer.  Continue Medihoney to buttock wound daily cover with foam dressing.  Changing dressing every 3 days or as needed soiling -Left hand skin maceration: Eucerin ordered, discussed hand split with OT, continue -Area of redness between left thumb and index finger: continue foam dressing -Scalp eczema: selsun blue ordered, continue -9/7 patient's mother feels like this is causing her to itch, will start some hydrocortisone cream -Small sore on the base of 5th metatarsal head: local wound care, continue -Rash on face: resolved, discussed with mother could have been secondary to added sugars in nutritional supplements -Sacral pressure injury: air mattress ordered, Placed nursing order to offload q1 hour while awake, continue medihoney, wound care consulted, discussed with nursing that this is improving -Blood blister to left heel: continue foam heel protectors.  7. Fluids/Electrolytes/Nutrition: Routine in and outs with follow-up chemistries  -Eating  well with cueing   -Dysphagia: continue D3/thins--tolerating this diet -Low protein stores: d/c prosource since patient hates these and is eating 100% of meals -Suboptimal vitamin D level: increase to 5,000 U daily. continue this dose  -History of magnesium deficiency: magnesium 500mg  started HS  8.  Cavitary right lower lobe pneumonia likely aspiration pneumonia/MRSA pneumonia.  Resolved    Latest Ref Rng & Units 02/05/2023   11:12 AM 01/22/2023    6:36 AM 12/20/2022    10:54 AM  CBC  WBC 4.0 - 10.5 K/uL 8.5  8.1  5.6   Hemoglobin 12.0 - 15.0 g/dL 13.0  86.5  78.4   Hematocrit 36.0 - 46.0 % 46.7  47.5  42.8   Platelets 150 - 400 K/uL 284  274  253      9.  History of drug abuse.  Positive cocaine on urine drug screen.  Provide counseling  10.  AKI/hypovolemia and ATN.  Resolved Appears to have fair fluid intake, creatinine has been stable  11/3- Push fluids due  ot low BP  -03/24/23 Cr stable yesterday, BPs stable, monitor    Latest Ref Rng & Units 03/30/2023    5:35 AM 03/23/2023    5:11 AM 03/16/2023    6:53 AM  BMP  Creatinine 0.44 - 1.00 mg/dL 1.61  0.96  0.45     11.  Mild transaminitis with rhabdomyolysis.  Both resolved  12.  Hypotension. Resolved, d/c midodrine -d/c propanolol, decrease baclofen to 10mg  HS>> back up to 20mg  QHS, decrease tizanidine to 2mg  TID 11/2- BP was running 87 systolic last night- and max of 110 in last 24 hours- not dizzy per chart, but if develops any dizziness, suggest primary team restarts Midodrine. Amantadine can cause hypotension, FYI 11/3- No dizziness- BP on low side- will push fluids, but might restart Midodrine. If becomes yellow MEWS again, will restart midodrine today- d/w nursing  -11/9-10/24 BPs stable, monitor -03/31/23 BPs soft but overall stable-- may need midodrine? Push PO fluids  Vitals:   03/27/23 1507 03/27/23 1935 03/28/23 0438 03/28/23 1355  BP: 98/71 107/73 104/69 102/72   03/28/23 1909 03/29/23 0447 03/29/23 1343 03/29/23 1951  BP: 104/72 (!) 85/59 (!) 89/59 97/63   03/30/23 0441 03/30/23 1356 03/30/23 1914 03/31/23 0314  BP: 100/67 (!) 85/52 (!) 91/53 99/67   13. HTN: see above Increase tizanidine to 6mg  TID--now 4mg  TID>>2mg  TID. Magnesium supplement added HS. Increase propanolol to 20mg  TID>> d/c'd, d/c clonidine patch given hypotension  14. Impaired initiation: continue amantadine 100mg  BID  15. Hyperglycemia: CBGs mildly elevated, d/ced checks. Prosource d/ced as per patient's  request  16. Spasticity:  -discussed serial casting with OT and her mom but she defers given pain she had when this was attempted -baclofen and tizanidine decreased due to hypotension, spasticity has been stable. Continue wrist, elbow brace, and PRAFOs. Ordered a night splint for LLE  Repeat CMP today given chronic tizanidine use -9/7 continues to have significant tone despite use of tizanidine and baclofen, may be beneficial consider baclofen pump in outpatient setting if this causes significant difficulties in care  -9/8 consider decrease baclofen if dyskinetic movements continue  -10/5-6  continue orthotics, ROM, positioning  when possible  17.  Dyskinetic movements in head and neck - appears to be myoclonic jerks related to hypoxic brain injury - as discussed with pt mom, no specific meds for this on antispasticity meds , consioder benzo although with pt's SUD would avoid this class - EEG reviewed and was negative for seizure.  -Decrease baclofen to 15mg  TID>> down to 10mg  at bedtime>> now up to 20mg  QHS  18. Cervical extensor weakness: tried decreasing tizanidine but spasticity appears increased and BP elevated so will increase back to 6mg  TID, continue this dose, continue cervical collar>> now on 4mg  TID>> down to 2mg  TID  19. Bowel and bladder incontinence: continue bowel and bladder program -03/31/23 no BM since 03/28/23, if no BM by tomorrow then may need to add meds for BM  20. Fatigue: improved, discussed with team may be secondary to anti-spasticity medications, B12 reviewed and is 335, in suboptimal range, will give 1,000U B12 injection 8/7, will order monthly as needed, metanx started, increase to BID, continue -Intermittent lethargy: labs reviewed and are stable, discussed with team may be secondary to anti-spasticity medications  21. MRSA nares positive: negative on repeat, precautions d/ced  22. Tachycardic: resolved, d/c propanolol.  Continue Tizanidine increased to 6mg   TID>> now down to 2mg  TID. Resolved, amantadine increased back to 100mg  BID   10/28 HR controlled  in 80's  -03/31/23- HR controlled- con't regimen  23. S/p fall: CT head reviewed and is stable  24. Bilateral foot drop: bilateral AFOs ordered, discussed that these can help her to walk better  25. Vaginal itching: clotrimazole ordered, discussed with patient  26. Dysuria: UA/UC ordered, discussed negative     LOS: 133 days A FACE TO FACE EVALUATION WAS PERFORMED  [id number] 03/31/2023, 12:20 PM

## 2023-04-01 MED ORDER — BISACODYL 10 MG RE SUPP: 10.0000 mg | Freq: Every day | RECTAL | Status: DC | PRN

## 2023-04-01 MED ORDER — SORBITOL 70 % SOLN
30.0000 mL | Freq: Once | Status: AC
Start: 2023-04-01 — End: 2023-04-01
  Administered 2023-04-01: 30 mL via ORAL
  Filled 2023-04-01: qty 30

## 2023-04-01 MED ORDER — POLYETHYLENE GLYCOL 3350 17 G PO PACK
17.0000 g | PACK | Freq: Every day | ORAL | Status: DC
  Administered 2023-04-01 – 2023-04-16 (×16): 17 g via ORAL
  Filled 2023-04-01 (×16): qty 1

## 2023-04-01 NOTE — Progress Notes (Signed)
PROGRESS NOTE   Subjective/Complaints:  Pt doing ok, but states she didn't sleep well again because she had to get up to urinate. Pain better today. Unsure of LBM, last documented was 03/28/23, agreeable with adding meds today. States she's urinating fine without any discomfort. Denies any other complaints or concerns today.    ROS:  Pt denies SOB, abd pain, CP, N/V/D, and vision changes  +bladder incontinence, +migraines- well controlled, +spasticity- stable, +impaired ambulation, +low back pain, +whole body hypersensitivity, +vaginal itching  Objective:   No results found. No results for input(s): "WBC", "HGB", "HCT", "PLT" in the last 72 hours.     Recent Labs    03/30/23 0535  CREATININE 0.74       Intake/Output Summary (Last 24 hours) at 04/01/2023 1013 Last data filed at 04/01/2023 0816 Gross per 24 hour  Intake 1078 ml  Output --  Net 1078 ml       Pressure Injury 02/09/23 Heel Left Deep Tissue Pressure Injury - Purple or maroon localized area of discolored intact skin or blood-filled blister due to damage of underlying soft tissue from pressure and/or shear. Black/purple and boggy,  size of a quar (Active)  02/09/23 0543  Location: Heel  Location Orientation: Left  Staging: Deep Tissue Pressure Injury - Purple or maroon localized area of discolored intact skin or blood-filled blister due to damage of underlying soft tissue from pressure and/or shear.  Wound Description (Comments): Black/purple and boggy,  size of a quarter.  Foam dressing applied  Present on Admission: No    Physical Exam: Vital Signs Blood pressure 103/77, pulse 66, temperature 98 F (36.7 C), resp. rate 18, height 5\' 1"  (1.549 m), weight 51.8 kg, SpO2 98%.   General: awake, alert, in bed but getting assisted to get up to urinate; NAD HENT: conjugate gaze; oropharynx moist, dons glasses independently CV: regular rate and rhythm;  no JVD Pulmonary: CTA B/L; no W/R/R- good air movement GI: soft, NT, ND, (+)BS throughout Psychiatric: engaging, interactive, impulsive at times Neurological: delayed responses- speech clearer today  PRIOR EXAMS: Skin: + Scaling dried skin on bilateral heels  - 2 small excoriations, sore on the base of the 5th metatarsal head -no longer present - rash on face - no longer present - stage 2 to right toe -no longer present  + area of redness between left thumb and index finger-now minimal, purple bruise left buttock unstageable - not examined +redness to bilateral elbows -not appreciated + Left heel stage III DTI, covered in foam dressing  Neurologic: AAOx2,  to year but not month or date. Follows most simple commands.  + Hypophonation, speech intelligability much improved MAS 1 right elbow,MAS 1 in RIght finger and wrist flexors  MAS 3 knee flexors, and MAS 3 L>R  hip adductors  MAS 3 L elbow , MAS 3-4 in L wrist and finger flexors  + LUE WHO--- Hypersensitivity to left hand improved, +left hand contracture, exam stable 11/5  Motor 0/5 strength in LUE and LLE, showing volitional movements in right lower extremity but difficulty to obtain MMT due to her pain/braces, able to do upper body dressing now! Ambulated with RW! Exam stable 11/14  Twice daily for right upper extremity 1/5 finger flexion, otherwise no volitional movement of left upper extremity or bilateral lowers + BL UE WHO.    Musculoskeletal: reduced bilateral shoulder and elbow ROM due to increased tone , pain with passive knee extension bilaterally   . GU: bladder incontinence  Assessment/Plan: 1. Functional deficits which require 3+ hours per day of interdisciplinary therapy in a comprehensive inpatient rehab setting. Physiatrist is providing close team supervision and 24 hour management of active medical problems listed below. Physiatrist and rehab team continue to assess barriers to discharge/monitor patient progress  toward functional and medical goals  Care Tool:  Bathing    Body parts bathed by patient: Face, Abdomen, Chest, Left upper leg, Right upper leg, Left arm, Front perineal area   Body parts bathed by helper: Right arm, Buttocks, Right lower leg, Left lower leg Body parts n/a: Right arm, Left arm, Front perineal area, Buttocks, Right upper leg, Left upper leg, Right lower leg, Left lower leg   Bathing assist Assist Level: Moderate Assistance - Patient 50 - 74%     Upper Body Dressing/Undressing Upper body dressing   What is the patient wearing?: Pull over shirt    Upper body assist Assist Level: Contact Guard/Touching assist    Lower Body Dressing/Undressing Lower body dressing      What is the patient wearing?: Incontinence brief, Pants     Lower body assist Assist for lower body dressing: Maximal Assistance - Patient 25 - 49%     Toileting Toileting Toileting Activity did not occur (Clothing management and hygiene only):  (incontient and required total for clean u)  Toileting assist Assist for toileting: Maximal Assistance - Patient 25 - 49%     Transfers Chair/bed transfer  Transfers assist  Chair/bed transfer activity did not occur: Safety/medical concerns  Chair/bed transfer assist level: Moderate Assistance - Patient 50 - 74%     Locomotion Ambulation   Ambulation assist   Ambulation activity did not occur: Safety/medical concerns  Assist level: Moderate Assistance - Patient 50 - 74% Assistive device: Walker-rolling Max distance: 49ft   Walk 10 feet activity   Assist  Walk 10 feet activity did not occur: Safety/medical concerns  Assist level: Moderate Assistance - Patient - 50 - 74% Assistive device: Walker-rolling   Walk 50 feet activity   Assist Walk 50 feet with 2 turns activity did not occur: Safety/medical concerns         Walk 150 feet activity   Assist Walk 150 feet activity did not occur: Safety/medical concerns          Walk 10 feet on uneven surface  activity   Assist Walk 10 feet on uneven surfaces activity did not occur: Safety/medical concerns         Wheelchair     Assist Is the patient using a wheelchair?: Yes Type of Wheelchair: Manual    Wheelchair assist level: Dependent - Patient 0% Max wheelchair distance: 35ft    Wheelchair 50 feet with 2 turns activity    Assist    Wheelchair 50 feet with 2 turns activity did not occur: Safety/medical concerns   Assist Level: Dependent - Patient 0%   Wheelchair 150 feet activity     Assist  Wheelchair 150 feet activity did not occur: Safety/medical concerns   Assist Level: Dependent - Patient 0%   Blood pressure 103/77, pulse 66, temperature 98 F (36.7 C), resp. rate 18, height 5\' 1"  (1.549 m), weight 51.8 kg, SpO2 98%.  Medical Problem List and Plan: 1. 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  Impaired mobility: continue Lovenox 30mg  daily, weekly creatinine ordered             -antiplatelet therapy: N/A  3. Pain:  -Knee pain: Tylenol as needed, continue voltaren gel scheduled. XR reviewed and is stable, discussed with therapy -Right wrist swelling: XR ordered and shows no acute fracture, shows 2.70mm ulnar variance, brace removed to given patient a break from it. Voltaren gel ordered prn for discomfort, continue, CT ordered and show no acute osseus injury, continue ace wrap   -Foot pain: tramadol ordered prn, improved today, continue prn  -Headaches: continue tylenol and tramadol prn -Low back pain: continue kpad -Whole body  hypersensitivity: gabapentin 100mg  TID ordered  4. History of anxiety: discussed with her mom that she feels this was a big risk factor for her accident, propanolol ordered. Continue magnesium supplement  5. Neuropsych/cognition: This patient is not capable of making decisions on her own behalf  - 10/12: Still not capable of medical decision making; BUT oriented and communicating, following directions - MUCH improved -Will add PRN melatonin 3mg  10/20 for insomnia  6. Skin: -Left buttock unstageable ulcer.  Continue Medihoney to buttock wound daily cover with foam dressing.  Changing dressing every 3 days or as needed soiling -Left hand skin maceration: Eucerin ordered, discussed hand split with OT, continue -Area of redness between left thumb and index finger: continue foam dressing -Scalp eczema: selsun blue ordered, continue -9/7 patient's mother feels like this is causing her to itch, will start some hydrocortisone cream -Small sore on the base of 5th metatarsal head: local wound care, continue -Rash on face: resolved, discussed with mother could have been secondary to added sugars in nutritional supplements -Sacral pressure injury: air mattress ordered, Placed nursing order to offload q1 hour while awake, continue medihoney, wound care consulted, discussed with nursing that this is improving -Blood blister to left heel: continue foam heel protectors.  7. Fluids/Electrolytes/Nutrition: Routine in and outs with follow-up chemistries  -Eating  well with cueing   -Dysphagia: continue D3/thins--tolerating this diet -Low protein stores: d/c prosource since patient hates these and is eating 100% of meals -Suboptimal vitamin D level: increase to 5,000 U daily. continue this dose  -History of magnesium deficiency: magnesium 500mg  started HS  8.  Cavitary right lower lobe pneumonia likely aspiration pneumonia/MRSA pneumonia.  Resolved    Latest Ref Rng & Units 02/05/2023   11:12 AM 01/22/2023     6:36 AM 12/20/2022   10:54 AM  CBC  WBC 4.0 - 10.5 K/uL 8.5  8.1  5.6   Hemoglobin 12.0 - 15.0 g/dL 30.8  65.7  84.6   Hematocrit 36.0 - 46.0 % 46.7  47.5  42.8   Platelets 150 - 400 K/uL 284  274  253      9.  History of drug abuse.  Positive cocaine on urine drug screen.  Provide counseling  10.  AKI/hypovolemia and ATN.  Resolved Appears to have fair fluid intake, creatinine has been stable  11/3- Push fluids due ot low BP  -03/24/23 Cr stable yesterday, BPs stable, monitor    Latest Ref Rng & Units 03/30/2023    5:35 AM 03/23/2023    5:11 AM 03/16/2023    6:53 AM  BMP  Creatinine 0.44 - 1.00 mg/dL 4.09  8.11  9.14     11.  Mild transaminitis with rhabdomyolysis.  Both resolved  12.  Hypotension. Resolved, d/c midodrine -d/c propanolol, decrease baclofen to 10mg  HS>> back up to 20mg  QHS, decrease tizanidine to 2mg  TID 11/2- BP was running 87 systolic last night- and max of 110 in last 24 hours- not dizzy per chart, but if develops any dizziness, suggest primary team restarts Midodrine. Amantadine can cause hypotension, FYI 11/3- No dizziness- BP on low side- will push fluids, but might restart Midodrine. If becomes yellow MEWS again, will restart midodrine today- d/w nursing  -11/9-10/24 BPs stable, monitor -03/31/23 BPs soft but overall stable-- may need midodrine? Push PO fluids -04/01/23 BPs better today, monitor  Vitals:   03/28/23 1355 03/28/23 1909 03/29/23 0447 03/29/23 1343  BP: 102/72 104/72 (!) 85/59 (!) 89/59   03/29/23 1951 03/30/23 0441 03/30/23 1356 03/30/23 1914  BP: 97/63 100/67 (!) 85/52 (!) 91/53   03/31/23 0314 03/31/23 1532 03/31/23 1925 04/01/23 0522  BP: 99/67 96/63 113/87 103/77   13. HTN: see above Increase tizanidine to 6mg  TID--now 4mg  TID>>2mg  TID. Magnesium supplement added HS. Increase propanolol to 20mg  TID>> d/c'd, d/c clonidine patch given hypotension  14. Impaired initiation: continue amantadine 100mg  BID  15. Hyperglycemia: CBGs mildly  elevated, d/ced checks. Prosource d/ced as per patient's request  16. Spasticity:  -discussed serial casting with OT and her mom but she defers given pain she had when this was attempted -baclofen and tizanidine decreased due to hypotension, spasticity has been stable. Continue wrist, elbow brace, and PRAFOs. Ordered a night splint for LLE  Repeat CMP today given chronic tizanidine use -9/7 continues to have significant tone despite use of tizanidine and baclofen, may be beneficial consider baclofen pump in outpatient setting if this causes significant difficulties in care  -9/8 consider decrease baclofen if dyskinetic movements continue  -10/5-6  continue orthotics, ROM, positioning  when possible  17.  Dyskinetic movements in head and neck - appears to be myoclonic jerks related to hypoxic brain injury - as discussed with pt mom, no specific meds for this on antispasticity meds , consioder benzo although with pt's SUD would avoid this class - EEG reviewed and was negative for seizure.  -Decrease baclofen to 15mg  TID>> down to 10mg  at bedtime>> now up to 20mg  QHS  18. Cervical extensor weakness: tried decreasing tizanidine but spasticity appears increased and BP elevated so will increase back to 6mg  TID, continue this dose, continue cervical collar>> now on 4mg  TID>> down to 2mg  TID  19. Bowel and bladder incontinence: continue bowel and bladder program -03/31/23 no BM since 03/28/23, if no BM by tomorrow then may need to add meds for BM -04/01/23 still no BM, will start miralax every day, give sorbitol x1, added PRN dulcolax suppository; monitor for effects  20. Fatigue: improved, discussed with team may be secondary to anti-spasticity medications, B12 reviewed and is 335, in suboptimal range, will give 1,000U B12 injection 8/7, will order monthly as needed, metanx started, increase to BID, continue -Intermittent lethargy: labs reviewed and are stable, discussed with team may be secondary to  anti-spasticity medications  21. MRSA nares positive: negative on repeat, precautions d/ced  22.  Tachycardic: resolved, d/c propanolol.  Continue Tizanidine increased to 6mg  TID>> now down to 2mg  TID. Resolved, amantadine increased back to 100mg  BID   10/28 HR controlled in 80's  -11/16-17/24- HR controlled- con't regimen  23. S/p fall: CT head reviewed and is stable  24. Bilateral foot drop: bilateral AFOs ordered, discussed that these can help her to walk better  25. Vaginal itching: clotrimazole ordered, discussed with patient  26. Dysuria: UA/UC ordered, discussed negative     LOS: 134 days A FACE TO FACE EVALUATION WAS PERFORMED  [address] 04/01/2023, 10:13 AM

## 2023-04-02 LAB — BASIC METABOLIC PANEL
Anion gap: 10 (ref 5–15)
BUN: 17 mg/dL (ref 6–20)
CO2: 26 mmol/L (ref 22–32)
Calcium: 8.8 mg/dL — ABNORMAL LOW (ref 8.9–10.3)
Chloride: 100 mmol/L (ref 98–111)
Creatinine, Ser: 0.85 mg/dL (ref 0.44–1.00)
GFR, Estimated: >60 mL/min (ref >60)
Glucose, Bld: 85 mg/dL (ref 70–99)
Potassium: 4.1 mmol/L (ref 3.5–5.1)
Sodium: 136 mmol/L (ref 135–145)

## 2023-04-02 MED ORDER — ENSURE ENLIVE PO LIQD
237.0000 mL | Freq: Two times a day (BID) | ORAL | Status: DC
  Administered 2023-04-02: 237 mL via ORAL

## 2023-04-02 NOTE — Progress Notes (Signed)
Nutrition Follow-up  DOCUMENTATION CODES:   Not applicable  INTERVENTION:  - Continue sending double protein portions with meals  - Continue MVI/Minerals daily, vitamin C 500 mg daily to support wound healing.   - Provide Prune juice to meals to help constipation   - Provide Ensure Enlive BID, each supplement provides 350 kcal and 20 grams of protein.  - Ordered BMP, last BMP was 02/05/23  NUTRITION DIAGNOSIS:   Increased nutrient needs related to acute illness, other (see comment), wound healing (prolonged hospitalization, previously severe malnutrition) as evidenced by estimated needs.  - Ongoing  GOAL:   Patient will meet greater than or equal to 90% of their needs  - Meeting, Good PO intake and Oral nutritional supplements  MONITOR:   PO intake, Supplement acceptance  REASON FOR ASSESSMENT:   Malnutrition Screening Tool    ASSESSMENT:   47 y.o. 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 PMH includes: anxiety, asthma, depression.  Noted plan of care for possible d/c to SNF- awaiting bed acceptance  Pt endorses good PO intake and appetite. Receiving double protein portions with meals.  Pt wanting to try Ensure, prefers strawberry or chocolate. Pt reports having constipation, states last BM was 2 days ago. Will add prune juice to meal trays.   - [name] weight is trending up. She has gained 7 lbs in almost 2 weeks.   - Last basic metabolic panel was 02/05/23, recommend updated labs   Admit weight: 47.4 kg  Current weight: 51.8 lg   Average Meal Intake: 10/15: 100% dinner 11/16: 100% breakfast, 50% lunch, 90% dinner 11/17: 100% breakfast + dinner, 80% lunch 11/18: 100% breakfast   Nutritionally Relevant Medications: Scheduled Meds:  ascorbic acid  500 mg Oral Daily   cholecalciferol  5,000 Units Oral Daily   l-methylfolate-B6-B12  1 tablet Oral BID   magnesium  gluconate  500 mg Oral QHS   multivitamin with minerals  1 tablet Oral Daily   polyethylene glycol  17 g Oral Daily   Labs Reviewed: - Last basic metabolic panel was 02/05/23, recommend updated labs   Diet Order:   Diet Order             DIET DYS 3 Room service appropriate? Yes; Fluid consistency: Thin  Diet effective now                   EDUCATION NEEDS:   Not appropriate for education at this time  Skin:  Skin Assessment: Skin Integrity Issues: Skin Integrity Issues:: DTI DTI: left heel, dressing intact Unstageable: left buttocks Other: abrasion to buttocks, blister to right and left heel, ecchymosis to left and right abdomen, erythema to bilateral buttock  Last BM:  11/13: type 4  Height:   Ht Readings from Last 1 Encounters:  11/18/22 5\' 1"  (1.549 m)    Weight:   Wt Readings from Last 1 Encounters:  03/26/23 51.8 kg    Ideal Body Weight:  47.7 kg  BMI:  Body mass index is 21.58 kg/m.  Estimated Nutritional Needs:   Kcal:  1600-1800  Protein:  80-100 gm  Fluid:  >/= 1.6 L  Elliot Dally, RD Registered Dietitian  See Amion for more information

## 2023-04-02 NOTE — Progress Notes (Signed)
Physical Therapy Session Note  Patient Details  Name: Nicole [name] MRN: [id number] Date of Birth: [birth year]  Today's Date: 04/02/2023 PT Individual Time: 8119-1478  + [id number]  PT Individual Time Calculation (min): 73 min  + 43 min  Short Term Goals: Week 1:  PT Short Term Goal 1 (Week 1): Pt will ambulate 56ft with modA and LRAD PT Short Term Goal 1 - Progress (Week 1): Not met PT Short Term Goal 2 (Week 1): Pt will complete functional outcome measure to assess balance and falls risk PT Short Term Goal 2 - Progress (Week 1): Not met PT Short Term Goal 3 (Week 1): Week 13: Pt will complete bed<>chair transfers with totalA (pt <25%) PT Short Term Goal 3 - Progress (Week 1): Not met  Skilled Therapeutic Interventions/Progress Updates:       1st session: Pt in bed to start, agreeable to therapy. Reports 8/10 lower back pain which started this AM. Reports she received medication from nursing to address this.   Completed LB Dressing at bed level with maxA for time management - pt able to bridge to help pull pants over hips. Donned shoes and L GRAFO with totalA as well.  Supine<>sitting EOB with modA for initiation, trunk support, and BLE management. Requries assist to scoot to EOB and minA for dynamic sitting balance as she completes UB dressing. Squat pivot transfer with maxA into w/c and wheeled sinkside as she requests to wash her face with a washcloth - did so with setupA.   Transported in w/c to main rehab gym for time management. Setup at High/low table. Instructed in simple to moderate complexity geometric puzzle. Completed in standing as able (standing tolerance ~1-2 minutes) with counter support at minA level. Min/modA for sit<>stand to counter top. Standing tolerance limited by lower back pain and ability to complete puzzle limited by internal distraction from pain. Provided her a moist heating pack for her lumbar back and completed the puzzle in sitting to reduce pain  exacerbation. Pt needing max cues for moderate complexity and min cues for simple complexity geometric puzzles.   Pt instructed in gait training using RW - sit<>stand to RW with min/modA. Ambulates 19ft + 40ft with modA of 1 person using the RW. Cues for upright posture, increasing L step length, and increasing B heel strike. Heavy mod/maxA needed for safely turning to sit to chair. Pt with improved upright posture and forward gaze compared to prior sessions while she ambulates.   Returned to her room and left sitting upright in w/c with pillow provided for better back support. Safety belt alarm on and call bell within reach.    2nd session: Direct handoff of care from SLP with patient sitting in w/c. She reports 7/10 lower back pain and pleasantly defer's standing or gait training this PM. Offered stretching which she was agreeable.   Transported to main rehab gym and assisted onto mat table with maxA squat pivot transfer. Assisted to supine with totalA for comfort due to lower back pain. Worked on stretching her into extension, knee to chests for lower back stretching, knee fall outs (windshield wipers), hamstring stretching, butterfly stretching. Also applied moist heating pack to lower back to help with pain relief.   Returned to her room and patient requesting to lie down in bed. Positioned to comfort and her mother in the room. Mother asking about grounds pass - educated her on general precautions and to be aware of therapy schedule. Messaged MD/LPN to ask for grounds pass.  All needs met.      Therapy Documentation Precautions:  Precautions Precautions: Fall Precaution Comments: delayed processing, Increased tone UB and LB Required Braces or Orthoses: Other Brace Other Brace: B adjustable night splints; B elbow "cosey" splints; L palm protector; R mitt Restrictions Weight Bearing Restrictions: No General:     Therapy/Group: Individual Therapy  Orrin Brigham 04/02/2023,  7:47 AM

## 2023-04-02 NOTE — Progress Notes (Signed)
Occupational Therapy Session Note  Patient Details  Name: Nicole [name] MRN: [id number] Date of Birth: 11-08-1975  Today's Date: 04/02/2023 OT Individual Time: 1005-1100 OT Individual Time Calculation (min): 55 min    Short Term Goals: Week 18: OT Short Term Goal 1 (Week 18): week 18: Pt will transfer to toilet with mod A with RW OT Short Term Goal 2 (Week 18): Week 18: Pt will be able to come to EOB in prep for ADLs with min A OT Short Term Goal 3 (Week 18): Pt will feed self 50% of meal with min A OT Short Term Goal 4 (Week 18): Week 18: Pt will use external aids to be oriented x3 with mod A  Skilled Therapeutic Interventions/Progress Updates:   Patient received seated in wheelchair, declined shower.  Patient asking to work on walking when given the option of task.  Patient noted to have significant hip and knee flexion in standing.  Transported to gym and transferred with mod assist to mat table via squat pivot.  Patient assisted down to supine with min assist to manage LLE.  Then assisted patient to roll toward prone.  Initially patient reported "lumbar" pain - but when task broken into smaller segments of movement, able to shift to prone with mod assist. Patient tstaes "I don't like this"  but able to stay in prone x several seconds.  Transitioned back to sitting/ long sitting and worked on rotation and flexion to scoot self to short sitting at edge of mat.  Sit to stand from mat with emphasis on weight through BLE and improving amount of hip/ knee and trunk extension.  Transitioned to grab bar to pull with BUE to stand with min assist, and sustaining standing for 10 seconds.  Patient did better overall with awareness of end point of any activity.  Taken back to room, and left up in wheelchair per her request.  Safety belt engaged and alarmed, and call bel/ in reach.  Telesitter remains in room.    Therapy Documentation Precautions:  Precautions Precautions: Fall Precaution Comments:  delayed processing, Increased tone UB and LB Required Braces or Orthoses: Other Brace Other Brace: B adjustable night splints; B elbow "cosey" splints; L palm protector; R mitt Restrictions Weight Bearing Restrictions: No   Pain:  Pain in calf/ lower back with stand to sit     Therapy/Group: Individual Therapy  [name] 04/02/2023, 12:39 PM

## 2023-04-02 NOTE — Progress Notes (Signed)
Occupational Therapy Session Note  Patient Details  Name: Nicole [name] MRN: [id number] Date of Birth: 1975-08-08  Session 1: {CHL IP REHAB OT TIME CALCULATIONS:304400400} Session 2: {CHL IP REHAB OT TIME CALCULATIONS:304400400}  Short Term Goals: OT Short Term Goal 1 week 18: Pt will transfer to toilet with mod A with RW OT Short Term Goal 2 Week 18: Pt will be able to come to EOB in prep for ADLs with min A OT Short Term Goal 3 Week 18: Pt will feed self 50% of meal with min A OT Short Term Goal 4 Week 18: Pt will use external aids to be oriented x3 with mod A  Skilled Therapeutic Interventions/Progress Updates:    Session 1:  Patient agreeable to participate in OT session. Reports *** pain level.   Patient participated in skilled OT session focusing on ***. Therapist facilitated/assessed/developed/educated/integrated/elicited *** in order to improve/facilitate/promote   Session 2: Patient agreeable to participate in OT session. Reports *** pain level.   Patient participated in skilled OT session focusing on ***. Therapist facilitated/assessed/developed/educated/integrated/elicited *** in order to improve/facilitate/promote    Therapy Documentation Precautions:  Precautions Precautions: Fall Precaution Comments: delayed processing, Increased tone UB and LB Required Braces or Orthoses: Other Brace Other Brace: B adjustable night splints; B elbow "cosey" splints; L palm protector; R mitt Restrictions Weight Bearing Restrictions: No   Therapy/Group: Individual Therapy  Limmie Patricia, OTR/L,CBIS  Supplemental OT - [facility] Secure Chat Preferred   04/02/2023, 10:06 PM

## 2023-04-02 NOTE — Progress Notes (Signed)
PROGRESS NOTE   Subjective/Complaints: C/o low back pain, Ephriam Knuckles notes that heat helps, messaged [place] to please bring her a kpad She has no other complaints  ROS:  Pt denies SOB, abd pain, CP, N/V/D, and vision changes  +bladder incontinence, +migraines- well controlled, +spasticity- stable, +impaired ambulation, +low back pain- continues, +whole body hypersensitivity, +vaginal itching  Objective:   No results found. No results for input(s): "WBC", "HGB", "HCT", "PLT" in the last 72 hours.     No results for input(s): "NA", "K", "CL", "CO2", "GLUCOSE", "BUN", "CREATININE", "CALCIUM" in the last 72 hours.      Intake/Output Summary (Last 24 hours) at 04/02/2023 0947 Last data filed at 04/01/2023 1749 Gross per 24 hour  Intake 473 ml  Output --  Net 473 ml       Pressure Injury 02/09/23 Heel Left Deep Tissue Pressure Injury - Purple or maroon localized area of discolored intact skin or blood-filled blister due to damage of underlying soft tissue from pressure and/or shear. Black/purple and boggy,  size of a quar (Active)  02/09/23 0543  Location: Heel  Location Orientation: Left  Staging: Deep Tissue Pressure Injury - Purple or maroon localized area of discolored intact skin or blood-filled blister due to damage of underlying soft tissue from pressure and/or shear.  Wound Description (Comments): Black/purple and boggy,  size of a quarter.  Foam dressing applied  Present on Admission: No    Physical Exam: Vital Signs Blood pressure 100/71, pulse 73, temperature 98.3 F (36.8 C), temperature source Oral, resp. rate 18, height 5\' 1"  (1.549 m), weight 51.8 kg, SpO2 99%.   General: awake, alert, in bed but getting assisted to get up to urinate; NAD HENT: conjugate gaze; oropharynx moist, dons glasses independently CV: regular rate and rhythm; no JVD Pulmonary: CTA B/L; no W/R/R- good air movement GI: soft,  NT, ND, (+)BS throughout Psychiatric: engaging, interactive, impulsive at times Neurological: delayed responses- speech clearer today  PRIOR EXAMS: Skin: + Scaling dried skin on bilateral heels  - 2 small excoriations, sore on the base of the 5th metatarsal head -no longer present - rash on face - no longer present - stage 2 to right toe -no longer present  + area of redness between left thumb and index finger-now minimal, purple bruise left buttock unstageable - not examined +redness to bilateral elbows -not appreciated + Left heel stage III DTI, covered in foam dressing  Neurologic: AAOx2,  to year but not month or date. Follows most simple commands.  + Hypophonation, speech intelligability much improved MAS 1 right elbow,MAS 1 in RIght finger and wrist flexors  MAS 3 knee flexors, and MAS 3 L>R  hip adductors  MAS 3 L elbow , MAS 3-4 in L wrist and finger flexors  + LUE WHO--- Hypersensitivity to left hand improved, +left hand contracture, exam stable 11/5  Motor 0/5 strength in LUE and LLE, showing volitional movements in right lower extremity but difficulty to obtain MMT due to her pain/braces, able to do upper body dressing now! Ambulated with RW! Exam stable 11/18  Twice daily for right upper extremity 1/5 finger flexion, otherwise no volitional movement of left upper extremity or  bilateral lowers + BL UE WHO.    Musculoskeletal: reduced bilateral shoulder and elbow ROM due to increased tone , pain with passive knee extension bilaterally   . GU: bladder incontinence  Assessment/Plan: 1. Functional deficits which require 3+ hours per day of interdisciplinary therapy in a comprehensive inpatient rehab setting. Physiatrist is providing close team supervision and 24 hour management of active medical problems listed below. Physiatrist and rehab team continue to assess barriers to discharge/monitor patient progress toward functional and medical goals  Care Tool:  Bathing     Body parts bathed by patient: Face, Abdomen, Chest, Left upper leg, Right upper leg, Left arm, Front perineal area   Body parts bathed by helper: Right arm, Buttocks, Right lower leg, Left lower leg Body parts n/a: Right arm, Left arm, Front perineal area, Buttocks, Right upper leg, Left upper leg, Right lower leg, Left lower leg   Bathing assist Assist Level: Moderate Assistance - Patient 50 - 74%     Upper Body Dressing/Undressing Upper body dressing   What is the patient wearing?: Pull over shirt    Upper body assist Assist Level: Contact Guard/Touching assist    Lower Body Dressing/Undressing Lower body dressing      What is the patient wearing?: Incontinence brief, Pants     Lower body assist Assist for lower body dressing: Maximal Assistance - Patient 25 - 49%     Toileting Toileting Toileting Activity did not occur (Clothing management and hygiene only):  (incontient and required total for clean u)  Toileting assist Assist for toileting: Maximal Assistance - Patient 25 - 49%     Transfers Chair/bed transfer  Transfers assist  Chair/bed transfer activity did not occur: Safety/medical concerns  Chair/bed transfer assist level: Moderate Assistance - Patient 50 - 74%     Locomotion Ambulation   Ambulation assist   Ambulation activity did not occur: Safety/medical concerns  Assist level: Moderate Assistance - Patient 50 - 74% Assistive device: Walker-rolling Max distance: 60ft   Walk 10 feet activity   Assist  Walk 10 feet activity did not occur: Safety/medical concerns  Assist level: Moderate Assistance - Patient - 50 - 74% Assistive device: Walker-rolling   Walk 50 feet activity   Assist Walk 50 feet with 2 turns activity did not occur: Safety/medical concerns         Walk 150 feet activity   Assist Walk 150 feet activity did not occur: Safety/medical concerns         Walk 10 feet on uneven surface  activity   Assist Walk 10 feet  on uneven surfaces activity did not occur: Safety/medical concerns         Wheelchair     Assist Is the patient using a wheelchair?: Yes Type of Wheelchair: Manual    Wheelchair assist level: Dependent - Patient 0% Max wheelchair distance: 55ft    Wheelchair 50 feet with 2 turns activity    Assist    Wheelchair 50 feet with 2 turns activity did not occur: Safety/medical concerns   Assist Level: Dependent - Patient 0%   Wheelchair 150 feet activity     Assist  Wheelchair 150 feet activity did not occur: Safety/medical concerns   Assist Level: Dependent - Patient 0%   Blood pressure 100/71, pulse 73, temperature 98.3 F (36.8 C), temperature source Oral, resp. rate 18, height 5\' 1"  (1.549 m), weight 51.8 kg, SpO2 99%.  Medical Problem List and Plan: 1. 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  Impaired mobility: continue Lovenox 30mg  daily, weekly creatinine ordered             -antiplatelet therapy: N/A  3. Pain:  -Knee pain: Tylenol as needed, continue voltaren gel scheduled. XR reviewed and is stable, discussed with therapy -Right wrist swelling: XR ordered and shows no acute fracture, shows 2.19mm ulnar variance, brace removed to given patient a break from it. Voltaren gel ordered prn for discomfort, continue, CT ordered and show no acute osseus injury, continue ace wrap   -Foot pain: tramadol ordered prn, improved today, continue prn  -Headaches: continue tylenol and tramadol prn -Low back pain: continue kpad, asked [place] to please bring for her -Whole body  hypersensitivity: gabapentin 100mg  TID ordered  4. History of anxiety: discussed with her mom that she feels this was a big risk factor for her accident, propanolol d/ced due to hypotension. Continue magnesium supplement  5. Neuropsych/cognition: This patient is not capable of making decisions on her own behalf  - 10/12: Still not capable of medical decision making; BUT oriented and communicating, following directions - MUCH improved -Will add PRN melatonin 3mg  10/20 for insomnia  6. Skin: -Left buttock unstageable ulcer.  Continue Medihoney to buttock wound daily cover with foam dressing.  Changing dressing every 3 days or as needed soiling -Left hand skin maceration: Eucerin ordered, discussed hand split with OT, continue -Area of redness between left thumb and index finger: continue foam dressing -Scalp eczema: selsun blue ordered, continue -9/7 patient's mother feels like this is causing her to itch, will start some hydrocortisone cream -Small sore on the base of 5th metatarsal head: local wound care, continue -Rash on face: resolved, discussed with mother could have been secondary to added sugars in nutritional supplements -Sacral pressure injury: air mattress ordered, Placed nursing order to offload q1 hour while awake, continue medihoney, wound care consulted, discussed with nursing that this is improving -Blood blister to left heel: continue foam heel protectors.  7. Fluids/Electrolytes/Nutrition: Routine in and outs with follow-up chemistries  -Eating  well with cueing   -Dysphagia: continue D3/thins--tolerating this diet -Low protein stores: d/c prosource since patient hates these and is eating 100% of meals -Suboptimal vitamin D level: increase to 5,000 U daily. continue this dose  -History of magnesium deficiency: magnesium 500mg  started HS  8.  Cavitary right lower lobe pneumonia likely aspiration pneumonia/MRSA pneumonia.  Resolved    Latest Ref Rng & Units 02/05/2023    11:12 AM 01/22/2023    6:36 AM 12/20/2022   10:54 AM  CBC  WBC 4.0 - 10.5 K/uL 8.5  8.1  5.6   Hemoglobin 12.0 - 15.0 g/dL 40.9  81.1  91.4   Hematocrit 36.0 - 46.0 % 46.7  47.5  42.8   Platelets 150 - 400 K/uL 284  274  253      9.  History of drug abuse.  Positive cocaine on urine drug screen.  Provide counseling  10.  AKI/hypovolemia and ATN.  Resolved Appears to have fair fluid intake, creatinine has been stable  11/3-  Push fluids due ot low BP  -03/24/23 Cr stable yesterday, BPs stable, monitor    Latest Ref Rng & Units 03/30/2023    5:35 AM 03/23/2023    5:11 AM 03/16/2023    6:53 AM  BMP  Creatinine 0.44 - 1.00 mg/dL 4.78  2.95  6.21     11.  Mild transaminitis with rhabdomyolysis.  Both resolved  12.  Hypotension. Resolved, d/c midodrine -d/c propanolol, decrease baclofen to 10mg  HS>> back up to 20mg  QHS, decrease tizanidine to 2mg  TID 11/2- BP was running 87 systolic last night- and max of 110 in last 24 hours- not dizzy per chart, but if develops any dizziness, suggest primary team restarts Midodrine. Amantadine can cause hypotension, FYI 11/3- No dizziness- BP on low side- will push fluids, but might restart Midodrine. If becomes yellow MEWS again, will restart midodrine today- d/w nursing  -11/9-10/24 BPs stable, monitor -03/31/23 BPs soft but overall stable-- may need midodrine? Push PO fluids -04/01/23 BPs better today, monitor  Vitals:   03/29/23 1343 03/29/23 1951 03/30/23 0441 03/30/23 1356  BP: (!) 89/59 97/63 100/67 (!) 85/52   03/30/23 1914 03/31/23 0314 03/31/23 1532 03/31/23 1925  BP: (!) 91/53 99/67 96/63  113/87   04/01/23 0522 04/01/23 1258 04/01/23 1922 04/02/23 0402  BP: 103/77 97/64 (!) 94/59 100/71   13. HTN: see above Increase tizanidine to 6mg  TID--now 4mg  TID>>2mg  TID. Magnesium supplement added HS. Increase propanolol to 20mg  TID>> d/c'd, d/c clonidine patch given hypotension  14. Impaired initiation: continue amantadine 100mg  BID  15.  Hyperglycemia: CBGs mildly elevated, d/ced checks. Prosource d/ced as per patient's request  16. Spasticity:  -discussed serial casting with OT and her mom but she defers given pain she had when this was attempted -baclofen and tizanidine decreased due to hypotension, spasticity has been stable. Continue wrist, elbow brace, and PRAFOs. Ordered a night splint for LLE  Repeat CMP today given chronic tizanidine use -9/7 continues to have significant tone despite use of tizanidine and baclofen, may be beneficial consider baclofen pump in outpatient setting if this causes significant difficulties in care  -9/8 consider decrease baclofen if dyskinetic movements continue  -10/5-6  continue orthotics, ROM, positioning  when possible  17.  Dyskinetic movements in head and neck - appears to be myoclonic jerks related to hypoxic brain injury - as discussed with pt mom, no specific meds for this on antispasticity meds , consioder benzo although with pt's SUD would avoid this class - EEG reviewed and was negative for seizure.  -Decrease baclofen to 15mg  TID>> down to 10mg  at bedtime>> now up to 20mg  QHS  18. Cervical extensor weakness: tried decreasing tizanidine but spasticity appears increased and BP elevated so will increase back to 6mg  TID, continue this dose, continue cervical collar>> now on 4mg  TID>> down to 2mg  TID  19. Bowel and bladder incontinence: continue bowel and bladder program -03/31/23 no BM since 03/28/23, if no BM by tomorrow then may need to add meds for BM -04/01/23 still no BM, will start miralax every day, give sorbitol x1, added PRN dulcolax suppository; monitor for effects  20. Fatigue: improved, discussed with team may be secondary to anti-spasticity medications, B12 reviewed and is 335, in suboptimal range, will give 1,000U B12 injection 8/7, will order monthly as needed, metanx started, increase to BID, continue -Intermittent lethargy: labs reviewed and are stable, discussed with  team may be secondary to anti-spasticity medications  21. MRSA nares positive: negative on repeat, precautions d/ced  22. Tachycardic:  resolved, d/c propanolol.  Continue Tizanidine increased to 6mg  TID>> now down to 2mg  TID. Resolved, amantadine increased back to 100mg  BID   23. S/p fall: CT head reviewed and is stable  24. Bilateral foot drop: bilateral AFOs ordered, discussed that these can help her to walk better  25. Vaginal itching: clotrimazole ordered, discussed with patient  26. Dysuria: UA/UC ordered, discussed negative     LOS: 135 days A FACE TO FACE EVALUATION WAS PERFORMED  Drema Pry Voyd Groft 04/02/2023, 9:47 AM

## 2023-04-02 NOTE — Progress Notes (Signed)
Speech Language Pathology Daily Session Note  Patient Details  Name: Nicole [name] MRN: [id number] Date of Birth: [birth year]  Today's Date: 04/02/2023 SLP Individual Time: 1300-1330 SLP Individual Time Calculation (min): 30 min  Short Term Goals: Week 9: SLP Short Term Goal 1 (Week 9): Week 19 - Pt will solve basic to mildly complex problems with 90% accuracy given min assist. SLP Short Term Goal 2 (Week 9): Week 19 -Patient will utilize external aids for orientation to place and time with supervision level verbal cues SLP Short Term Goal 3 (Week 9): Week 19 - Pt will demonstrate sustained attention to basic tasks for 15 minutes with Min A multimodal cues for redirection. SLP Short Term Goal 4 (Week 9): Week 19 - Pt will utilize increased vocal intensity w/ supervision verbal cues to remain 90% intelligibile at sentence level  Skilled Therapeutic Interventions: SLP conducted skilled therapy session targeting communication and cognitive goals. Patient disoriented to month and date but oriented utilizing min assist to locate external aid placed in room. Patient and SLP targeted handwriting with patient benefiting from max cues for impulsivity with rate of writing impacting legibility. SLP provided hand over hand assist to prevent impulsive word completion and trialed various techniques to slow patient's rate. With rate effectively slowed, legibility improved to 100% at the word level. SLP will plan to continue targeting hand writing, working towards ability to create memory log to assist with memory of daily events. Patient endorses excitement towards this plan. Patient was left in chair with call bell in reach and chair alarm set. SLP will continue to target goals per plan of care.      Pain Pain Assessment Pain Scale: 0-10 Pain Score: 8  Pain Location: Back  Therapy/Group: Individual Therapy  [name], M.A., CCC-SLP  [name] 04/02/2023, 2:25 PM

## 2023-04-03 ENCOUNTER — Inpatient Hospital Stay (HOSPITAL_COMMUNITY): Payer: MEDICAID

## 2023-04-03 MED ORDER — FLUCONAZOLE 150 MG PO TABS
150.0000 mg | ORAL_TABLET | ORAL | Status: AC
  Administered 2023-04-03 – 2023-04-06 (×2): 150 mg via ORAL
  Filled 2023-04-03 (×2): qty 1

## 2023-04-03 MED ORDER — TIZANIDINE HCL 4 MG PO TABS
2.0000 mg | ORAL_TABLET | Freq: Two times a day (BID) | ORAL | Status: DC
  Administered 2023-04-03 – 2023-04-10 (×14): 2 mg via ORAL
  Filled 2023-04-03 (×14): qty 1

## 2023-04-03 NOTE — Progress Notes (Signed)
Pharmacy note:  Request to dose Fluconazole for UTI/vulvovaginitis. No improvement noted on Gyne-Lotrimin x 5 nights.  Plan: Fluconazole 150 mg PO today and repeat in 72hrs. May give a 3rd dose 72 hrs later if symptoms no resolved.   Dennie Fetters, RPh 04/03/2023 1:07 PM

## 2023-04-03 NOTE — Progress Notes (Signed)
Physical Therapy Session Note  Patient Details  Name: Nicole [name] MRN: [id number] Date of Birth: Mar 02, 1976  Today's Date: 04/03/2023 PT Individual Time: 4696-2952 + 1450-1530 PT Individual Time Calculation (min): 59 min  + 40 min  Short Term Goals: Week 2:  PT Short Term Goal 1 (Week 2): Week 16: Pt will complete bed mobility with modA consistently PT Short Term Goal 2 (Week 2): Week 16: Pt will complete bed<>Chair transfers wtith maxA PT Short Term Goal 3 (Week 2): Week 16: Pt will propel wheelchair 87ft with modA  Skilled Therapeutic Interventions/Progress Updates:      1st session: Pt sitting in w/c, receiving medication from nursing. Patient rates 6/10 lower back pain, has the k-pad aqua pad on for management. Per OT, patient requesting to brush teeth. SetupA at the sink and she completed this at wheelchair level. Transported to main rehab gym for time.   Gait training 36ft + 98ft with modA and RW - after ~72f she had difficulty lifting her LLE up enough to clear his toes. She also lacks heel strike on L which she attributes to heel pain from a bed sore.   Positioned patient in supine with her legs hanging off end of mat table to work on hip flexor stretching - poor tolerance with frequent spasms in her legs - benefited from calming and redirecting. Also tried to encourage her to relax her head into extension as she lay supine but she had difficulty tolerating that as well.   Gait training 3x42ft with RW and min/modA - removed L GRAFO due to poor tolerance and high sensitivity in her foot. Cues for correcting gait with larger stride length, upright posture, and forward gaze.   Noted pt to be incontinent of urine with soiled pants. Returned to her room and she was assisted onto the toilet with mod/maxA stand pivot transfer. TotalA required for managing her pants/briefs. Pt unable to void once on toilet but her soiled brief was malodorous - MD made aware.   TotalA for donning new clean  brief/pants and then returned to her w/c with similar assist. Session concluded with chair pad alarm on and call bell within reach.    2nd session: Direct handoff of care from OT with patient positioned in w/c with her shoes and L GRAFO on. Patient reports no resting pain, agreeable to PT tx.   Transported patient to day room rehab gym. Sit<>Stand to RW with min/modA with cues for foot placement as she tucks her R foot way too far behind her when she attempts to stand. Gait training only short distances, ~62ft + ~58ft with modA and RW with wheelchair follow for safety. Gait distance limited by pain in her L heel. Removed GRAFO and shoe to exam skin - she has a wound with ragged edges on her L heel and is tender to palpation. Also has generalized redness surrounding the heel. Messaged care team regarding concern for tolerance to standing and how her pain affects her functional mobility. Per MD, plan for foot XR.   Pt returned to her room and assisted back to bed with maxA stand pivot transfer. MaxA for sit>supine and patient able to roll in bed with supervision to allow positioning of k-pad under her lower back. Alarm on.    Therapy Documentation Precautions:  Precautions Precautions: Fall Precaution Comments: delayed processing, Increased tone UB and LB Required Braces or Orthoses: Other Brace Other Brace: B adjustable night splints; B elbow "cosey" splints; L palm protector; R mitt  Restrictions Weight Bearing Restrictions: No General:    Therapy/Group: Individual Therapy  Orrin Brigham 04/03/2023, 7:47 AM

## 2023-04-03 NOTE — Progress Notes (Signed)
Orthopedic Tech Progress Note Patient Details:  Nicole [name] 25-Jan-1976 [id number] Pt fitted for cam in bed and then device was removed and left at bedside.  Ortho Devices Type of Ortho Device: CAM walker Ortho Device/Splint Location: LLE Ortho Device/Splint Interventions: Ordered, Application, Adjustment, Removal   Post Interventions Patient Tolerated: Well  [name] 04/03/2023, 4:39 PM

## 2023-04-03 NOTE — Progress Notes (Signed)
Speech Language Pathology Daily Session Note  Patient Details  Name: Nicole [name] MRN: [id number] Date of Birth: Mar 29, 1976  Today's Date: 04/03/2023 SLP Individual Time: 1131-1200 SLP Individual Time Calculation (min): 29 min  Short Term Goals: Week 9: SLP Short Term Goal 1 (Week 9): Week 19 - Pt will solve basic to mildly complex problems with 90% accuracy given min assist. SLP Short Term Goal 2 (Week 9): Week 19 -Patient will utilize external aids for orientation to place and time with supervision level verbal cues SLP Short Term Goal 3 (Week 9): Week 19 - Pt will demonstrate sustained attention to basic tasks for 15 minutes with Min A multimodal cues for redirection. SLP Short Term Goal 4 (Week 9): Week 19 - Pt will utilize increased vocal intensity w/ supervision verbal cues to remain 90% intelligibile at sentence level  Skilled Therapeutic Interventions:  Patient was seen in late am to address cognitive re- training. Pt was alert and seated in WC. Pt appeared uncomfortable with declining position. She was repositioned upright with report of improved comfort. Pt was not initially oriented to temporal concepts stating the month was January. She required min A to locate calendar and month of year, date and day of week. She required mod cues to manipulate temporal concepts (I.e. If this month is November what was last month). She identified upcoming holiday with sup A. She participated in discussion regarding her family's Thanksgiving traditions, foods she ate, etc. She benefited from min cues for vocal intensity with intelligibility occasional dipping below 80% with ability to recoil with cue. Pt was oriented to current location and verbalized being in current setting for ~4 months. In additional minutes of session, SLP addressed problem solving, given visuals of scenarios pt challenged to identify problems and solutions which she completed with 83% acc with sup A. In final minutes of session, SLP  addressed writing. Given her notebook pt challenged to write words and short phrases given preferred tasks or items. Pt with ~95% acc in all attempts. SLP subsequently challenged pt to format daily schedule where she required mod A for organization of information, pacing, and focus on one letter at a time. She frequently became distracted requiring verbal cues for cessation due to perseveration on continuing to write. Throughout session, SLP addressed problem solving within current environment. She was not initially oriented to call button however given review of information she verbalized use of call button and examples of utilization for remainder of session. Pt located WC brakes with mod A. At conclusion of session pt was left seated upright in [facility] with call button within reach. SLP to continue POC.  Pain Pain Assessment Pain Scale: 0-10 Pain Score: 0-No pain  Therapy/Group: Individual Therapy  [name] 04/03/2023, 1:18 PM

## 2023-04-03 NOTE — Progress Notes (Signed)
PROGRESS NOTE   Subjective/Complaints: Hypotensive with MEWS 1- will decrease tizanidine to BID Foul smelling urine- recent UA was normal, asked nursing to bring her water chart and encourage hydration  ROS:  Pt denies SOB, abd pain, CP, N/V/D, and vision changes  +bladder incontinence, +migraines- well controlled, +spasticity- stable, +impaired ambulation, +low back pain- continues, +whole body hypersensitivity, +vaginal itching, +foul smelling urine as per therapy  Objective:   No results found. No results for input(s): "WBC", "HGB", "HCT", "PLT" in the last 72 hours.     Recent Labs    04/02/23 1539  NA 136  K 4.1  CL 100  CO2 26  GLUCOSE 85  BUN 17  CREATININE 0.85  CALCIUM 8.8*        Intake/Output Summary (Last 24 hours) at 04/03/2023 1215 Last data filed at 04/02/2023 1756 Gross per 24 hour  Intake 472 ml  Output --  Net 472 ml       Pressure Injury 02/09/23 Heel Left Deep Tissue Pressure Injury - Purple or maroon localized area of discolored intact skin or blood-filled blister due to damage of underlying soft tissue from pressure and/or shear. Black/purple and boggy,  size of a quar (Active)  02/09/23 0543  Location: Heel  Location Orientation: Left  Staging: Deep Tissue Pressure Injury - Purple or maroon localized area of discolored intact skin or blood-filled blister due to damage of underlying soft tissue from pressure and/or shear.  Wound Description (Comments): Black/purple and boggy,  size of a quarter.  Foam dressing applied  Present on Admission: No    Physical Exam: Vital Signs Blood pressure (!) 92/59, pulse 69, temperature 98.4 F (36.9 C), temperature source Oral, resp. rate 17, height 5\' 1"  (1.549 m), weight 51.8 kg, SpO2 98%.   General: awake, alert, in bed but getting assisted to get up to urinate; NAD HENT: conjugate gaze; oropharynx moist, dons glasses independently CV:  regular rate and rhythm; no JVD Pulmonary: CTA B/L; no W/R/R- good air movement GI: soft, NT, ND, (+)BS throughout Psychiatric: engaging, interactive, impulsive at times Neurological: delayed responses- speech clearer today  PRIOR EXAMS: Skin: + Scaling dried skin on bilateral heels  - 2 small excoriations, sore on the base of the 5th metatarsal head -no longer present - rash on face - no longer present - stage 2 to right toe -no longer present  + area of redness between left thumb and index finger-now minimal, purple bruise left buttock unstageable - not examined +redness to bilateral elbows -not appreciated + Left heel stage III DTI, covered in foam dressing  Neurologic: AAOx2,  to year but not month or date. Follows most simple commands.  + Hypophonation, speech intelligability much improved MAS 1 right elbow,MAS 1 in RIght finger and wrist flexors  MAS 3 knee flexors, and MAS 3 L>R  hip adductors  MAS 3 L elbow , MAS 3-4 in L wrist and finger flexors  + LUE WHO--- Hypersensitivity to left hand improved, +left hand contracture, exam stable 11/5  Motor 0/5 strength in LUE and LLE, showing volitional movements in right lower extremity but difficulty to obtain MMT due to her pain/braces, able to do upper body  dressing now! Ambulated with RW! Exam stable 11/18  Twice daily for right upper extremity 1/5 finger flexion, otherwise no volitional movement of left upper extremity or bilateral lowers + BL UE WHO.    Musculoskeletal: reduced bilateral shoulder and elbow ROM due to increased tone , pain with passive knee extension bilaterally   . GU: bladder incontinence, foul smelling urine  Assessment/Plan: 1. Functional deficits which require 3+ hours per day of interdisciplinary therapy in a comprehensive inpatient rehab setting. Physiatrist is providing close team supervision and 24 hour management of active medical problems listed below. Physiatrist and rehab team continue to assess  barriers to discharge/monitor patient progress toward functional and medical goals  Care Tool:  Bathing    Body parts bathed by patient: Face, Abdomen, Chest, Left upper leg, Right upper leg, Left arm, Front perineal area   Body parts bathed by helper: Right arm, Buttocks, Right lower leg, Left lower leg Body parts n/a: Right arm, Left arm, Front perineal area, Buttocks, Right upper leg, Left upper leg, Right lower leg, Left lower leg   Bathing assist Assist Level: Moderate Assistance - Patient 50 - 74%     Upper Body Dressing/Undressing Upper body dressing   What is the patient wearing?: Pull over shirt    Upper body assist Assist Level: Contact Guard/Touching assist    Lower Body Dressing/Undressing Lower body dressing      What is the patient wearing?: Incontinence brief, Pants     Lower body assist Assist for lower body dressing: Maximal Assistance - Patient 25 - 49%     Toileting Toileting Toileting Activity did not occur (Clothing management and hygiene only):  (incontient and required total for clean u)  Toileting assist Assist for toileting: Maximal Assistance - Patient 25 - 49%     Transfers Chair/bed transfer  Transfers assist  Chair/bed transfer activity did not occur: Safety/medical concerns  Chair/bed transfer assist level: Moderate Assistance - Patient 50 - 74%     Locomotion Ambulation   Ambulation assist   Ambulation activity did not occur: Safety/medical concerns  Assist level: Moderate Assistance - Patient 50 - 74% Assistive device: Walker-rolling Max distance: 76ft   Walk 10 feet activity   Assist  Walk 10 feet activity did not occur: Safety/medical concerns  Assist level: Moderate Assistance - Patient - 50 - 74% Assistive device: Walker-rolling   Walk 50 feet activity   Assist Walk 50 feet with 2 turns activity did not occur: Safety/medical concerns         Walk 150 feet activity   Assist Walk 150 feet activity did not  occur: Safety/medical concerns         Walk 10 feet on uneven surface  activity   Assist Walk 10 feet on uneven surfaces activity did not occur: Safety/medical concerns         Wheelchair     Assist Is the patient using a wheelchair?: Yes Type of Wheelchair: Manual    Wheelchair assist level: Dependent - Patient 0% Max wheelchair distance: 27ft    Wheelchair 50 feet with 2 turns activity    Assist    Wheelchair 50 feet with 2 turns activity did not occur: Safety/medical concerns   Assist Level: Dependent - Patient 0%   Wheelchair 150 feet activity     Assist  Wheelchair 150 feet activity did not occur: Safety/medical concerns   Assist Level: Dependent - Patient 0%   Blood pressure (!) 92/59, pulse 69, temperature 98.4 F (36.9 C), temperature  source Oral, resp. rate 17, height 5\' 1"  (1.549 m), weight 51.8 kg, SpO2 98%.  Medical Problem List and Plan: 1. 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  Impaired mobility: continue Lovenox 30mg  daily, weekly creatinine ordered             -antiplatelet therapy: N/A  3. Pain:  -Knee pain: Tylenol as needed, continue voltaren gel scheduled. XR reviewed and is stable, discussed with therapy -Right wrist swelling: XR ordered and shows no acute fracture, shows 2.47mm ulnar variance, brace removed to given patient a break from it. Voltaren gel ordered prn for discomfort, continue, CT ordered and show no acute osseus injury, continue ace wrap   -Foot pain: tramadol ordered prn, improved today, continue prn  -Headaches:  continue tylenol and tramadol prn -Low back pain: continue kpad, asked [place] to please bring for her -Whole body hypersensitivity: gabapentin 100mg  TID ordered  4. History of anxiety: discussed with her mom that she feels this was a big risk factor for her accident, propanolol d/ced due to hypotension. Continue magnesium supplement  5. Neuropsych/cognition: This patient is not capable of making decisions on her own behalf  - 10/12: Still not capable of medical decision making; BUT oriented and communicating, following directions - MUCH improved -Will add PRN melatonin 3mg  10/20 for insomnia  6. Skin: -Left buttock unstageable ulcer.  Continue Medihoney to buttock wound daily cover with foam dressing.  Changing dressing every 3 days or as needed soiling -Left hand skin maceration: Eucerin ordered, discussed hand split with OT, continue -Area of redness between left thumb and index finger: continue foam dressing -Scalp eczema: selsun blue ordered, continue -9/7 patient's mother feels like this is causing her to itch, will start some hydrocortisone cream -Small sore on the base of 5th metatarsal head: local wound care, continue -Rash on face: resolved, discussed with mother could have been secondary to added sugars in nutritional supplements -Sacral pressure injury: air mattress ordered, Placed nursing order to offload q1 hour while awake, continue medihoney, wound care consulted, discussed with nursing that this is improving -Blood blister to left heel: continue foam heel protectors.  7. Fluids/Electrolytes/Nutrition: Routine in and outs with follow-up chemistries  -Eating  well with cueing   -Dysphagia: continue D3/thins--tolerating this diet -Low protein stores: d/c prosource since patient hates these and is eating 100% of meals -Suboptimal vitamin D level: increase to 5,000 U daily. continue this dose  -History of magnesium deficiency: magnesium 500mg  started HS  8.  Cavitary right  lower lobe pneumonia likely aspiration pneumonia/MRSA pneumonia.  Resolved    Latest Ref Rng & Units 02/05/2023   11:12 AM 01/22/2023    6:36 AM 12/20/2022   10:54 AM  CBC  WBC 4.0 - 10.5 K/uL 8.5  8.1  5.6   Hemoglobin 12.0 - 15.0 g/dL 01.0  27.2  53.6   Hematocrit 36.0 - 46.0 % 46.7  47.5  42.8   Platelets 150 - 400 K/uL 284  274  253      9.  History of drug  abuse.  Positive cocaine on urine drug screen.  Provide counseling  10.  AKI/hypovolemia and ATN.  Resolved Appears to have fair fluid intake, creatinine has been stable  11/3- Push fluids due ot low BP  -03/24/23 Cr stable yesterday, BPs stable, monitor    Latest Ref Rng & Units 04/02/2023    3:39 PM 03/30/2023    5:35 AM 03/23/2023    5:11 AM  BMP  Glucose 70 - 99 mg/dL 85     BUN 6 - 20 mg/dL 17     Creatinine 8.65 - 1.00 mg/dL 7.84  6.96  2.95   Sodium 135 - 145 mmol/L 136     Potassium 3.5 - 5.1 mmol/L 4.1     Chloride 98 - 111 mmol/L 100     CO2 22 - 32 mmol/L 26     Calcium 8.9 - 10.3 mg/dL 8.8       11.  Mild transaminitis with rhabdomyolysis.  Both resolved  12.  Hypotension. Resolved, d/c midodrine -d/c propanolol, decrease baclofen to 10mg  HS>> back up to 20mg  QHS, decrease tizanidine to 2mg  TID 11/2- BP was running 87 systolic last night- and max of 110 in last 24 hours- not dizzy per chart, but if develops any dizziness, suggest primary team restarts Midodrine. Amantadine can cause hypotension, FYI Decrease tizanidine to BID  Vitals:   03/30/23 1356 03/30/23 1914 03/31/23 0314 03/31/23 1532  BP: (!) 85/52 (!) 91/53 99/67 96/63    03/31/23 1925 04/01/23 0522 04/01/23 1258 04/01/23 1922  BP: 113/87 103/77 97/64 (!) 94/59   04/02/23 0402 04/02/23 1550 04/02/23 2040 04/03/23 0432  BP: 100/71 95/66 (!) 87/48 (!) 92/59   13. HTN: see above Increase tizanidine to 6mg  TID--now 4mg  TID>>2mg  TID. Magnesium supplement added HS. Increase propanolol to 20mg  TID>> d/c'd, d/c clonidine patch given hypotension  14.  Impaired initiation: continue amantadine 100mg  BID  15. Hyperglycemia: CBGs mildly elevated, d/ced checks. Prosource d/ced as per patient's request  16. Spasticity:  -discussed serial casting with OT and her mom but she defers given pain she had when this was attempted -baclofen and tizanidine decreased due to hypotension, spasticity has been stable. Continue wrist, elbow brace, and PRAFOs. Ordered a night splint for LLE  Repeat CMP today given chronic tizanidine use -9/7 continues to have significant tone despite use of tizanidine and baclofen, may be beneficial consider baclofen pump in outpatient setting if this causes significant difficulties in care  Decrease tizanidine to BID  17.  Dyskinetic movements in head and neck - appears to be myoclonic jerks related to hypoxic brain injury - as discussed with pt mom, no specific meds for this on antispasticity meds , consioder benzo although with pt's SUD would avoid this class - EEG reviewed and was negative for seizure.  -Decrease baclofen to 15mg  TID>> down to 10mg  at bedtime>> now up to 20mg  QHS  18. Cervical extensor weakness: tried decreasing tizanidine but spasticity appears increased and BP elevated so will increase back to 6mg  TID, continue this dose, continue cervical collar>> now on 4mg  TID>> down to 2mg  TID  19. Bowel and bladder incontinence: continue bowel and bladder program -03/31/23 no BM since 03/28/23, if no BM by tomorrow then may need to add meds for BM -04/01/23 still no BM, will start miralax every day, give sorbitol x1, added PRN dulcolax suppository; monitor for effects  20. Fatigue: improved, discussed with team may be secondary to anti-spasticity medications, B12 reviewed and is 335, in suboptimal range, will give  1,000U B12 injection 8/7, will order monthly as needed, metanx started, increase to BID, continue, decrease tizanidine to BID  21. MRSA nares positive: negative on repeat, precautions d/ced  22.  Tachycardic: resolved, d/c propanolol.  Continue Tizanidine increased to 6mg  TID>> now down to 2mg  TID. Resolved, amantadine increased back to 100mg  BID   23. S/p fall: CT head reviewed and is stable  24. Bilateral foot drop: bilateral AFOs ordered, discussed that these can help her to walk better  25. Vaginal itching: clotrimazole ordered, discussed with patient, does not appear to be helping so will switch to fluconazole  26. Dysuria: UA/UC ordered, discussed negative  27. Foul smelling urine: asked nursing to encourage 6-8 glasses of water per day     LOS: 136 days A FACE TO FACE EVALUATION WAS PERFORMED  Clint Bolder P Tawana Pasch 04/03/2023, 12:15 PM

## 2023-04-04 LAB — BASIC METABOLIC PANEL
Anion gap: 9 (ref 5–15)
BUN: 20 mg/dL (ref 6–20)
CO2: 24 mmol/L (ref 22–32)
Calcium: 9.3 mg/dL (ref 8.9–10.3)
Chloride: 105 mmol/L (ref 98–111)
Creatinine, Ser: 0.69 mg/dL (ref 0.44–1.00)
GFR, Estimated: >60 mL/min (ref >60)
Glucose, Bld: 91 mg/dL (ref 70–99)
Potassium: 3.9 mmol/L (ref 3.5–5.1)
Sodium: 138 mmol/L (ref 135–145)

## 2023-04-04 LAB — CBC
HCT: 42.1 % (ref 36.0–46.0)
Hemoglobin: 14.1 g/dL (ref 12.0–15.0)
MCH: 31.5 pg (ref 26.0–34.0)
MCHC: 33.5 g/dL (ref 30.0–36.0)
MCV: 94 fL (ref 80.0–100.0)
Platelets: 224 10*3/uL (ref 150–400)
RBC: 4.48 MIL/uL (ref 3.87–5.11)
RDW: 12.7 % (ref 11.5–15.5)
WBC: 5.6 10*3/uL (ref 4.0–10.5)
nRBC: 0 % (ref 0.0–0.2)

## 2023-04-04 NOTE — Progress Notes (Signed)
Physical Therapy Session Note  Patient Details  Name: Nicole [name] MRN: [id number] Date of Birth: [birth year]  Today's Date: 04/04/2023 PT Individual Time: 0900-1015 PT Individual Time Calculation (min): 75 min   Short Term Goals: Week 1:  PT Short Term Goal 1 (Week 1): Pt will ambulate 27ft with modA and LRAD PT Short Term Goal 1 - Progress (Week 1): Not met PT Short Term Goal 2 (Week 1): Pt will complete functional outcome measure to assess balance and falls risk PT Short Term Goal 2 - Progress (Week 1): Not met PT Short Term Goal 3 (Week 1): Week 13: Pt will complete bed<>chair transfers with totalA (pt <25%) PT Short Term Goal 3 - Progress (Week 1): Not met  Skilled Therapeutic Interventions/Progress Updates:      Pt in bed with hospital gown on. Reports "yes" when asked if she needs changed. Brief minimally wet, totalA for brief change. Pt able to bridge and clear hips to don brief. Pants and socks donned with dependent assist for time management. Supine<>Sitting EOB with modA for initiation, trunk support, and BLE management. Assist for forward scooting to EOB. Donned CAM boot to her LLE, difficulty getting her heel fully into the boot due to tone. CAM boot ordered to see if it would provide pain relief with standing and ambulation. T-Shirt donned with maxA.  Transported patient to day room rehab gym. Sit<>Stand to RW with modA. She ambulates ~68ft + 12ft + 53ft + 80ft with modA and RW. She demonstrates crouched gait with decreased heel strike on L, forward flexed trunk, and knees flexed throughout gait cycle. Continued cues for upright posture, increasing L step length/clearance, and assist for RW management. She does require maxA for safely turning to sit.   With the CAM boot, she does seem to have increased tolerance to standing and weight bearing but it does affect her gait due to weight and bulkiness of the boot.   Worked on standing with back against wall and RW support at minA  level - used mirror for visual aid to help improve awareness for her flexed posture. Pt tolerating standing for ~2-3 minutes at a time and needed frequent cues for correcting posture. Tolerance limited by fatigue > pain in her L heel.   Instructed in standing using the // bars while she sat outside the // bars. She's able to complete these stands with CGA and again used mirror for encouraging upright posture with max visual cues.   Returned to her room and patient left sitting upright in w/c with all needs met, her mother present.   Therapy Documentation Precautions:  Precautions Precautions: Fall Precaution Comments: delayed processing, Increased tone UB and LB Required Braces or Orthoses: Other Brace Other Brace: B adjustable night splints; B elbow "cosey" splints; L palm protector; R mitt Restrictions Weight Bearing Restrictions: No General:    Therapy/Group: Individual Therapy  Orrin Brigham 04/04/2023, 7:46 AM

## 2023-04-04 NOTE — Progress Notes (Signed)
PROGRESS NOTE   Subjective/Complaints: Team conference today Trying to offload pressure injury to heel with cam boot today but this is heavier for her, foot XR negative  ROS:  Pt denies SOB, abd pain, CP, N/V/D, and vision changes  +bladder incontinence, +migraines- well controlled, +spasticity- stable, +impaired ambulation, +low back pain- continues, +whole body hypersensitivity, +vaginal itching, +foul smelling urine as per therapy  Objective:   DG Foot 2 Views Left  Result Date: 04/03/2023 CLINICAL DATA:  Left foot pain EXAM: LEFT FOOT - 2 VIEW COMPARISON:  None Available. FINDINGS: There is no evidence of fracture or dislocation. There is no evidence of arthropathy or other focal bone abnormality. Soft tissues are unremarkable. IMPRESSION: Negative. Electronically Signed   By: Charlett Nose M.D.   On: 04/03/2023 21:28   Recent Labs    04/04/23 0609  WBC 5.6  HGB 14.1  HCT 42.1  PLT 224       Recent Labs    04/02/23 1539 04/04/23 0609  NA 136 138  K 4.1 3.9  CL 100 105  CO2 26 24  GLUCOSE 85 91  BUN 17 20  CREATININE 0.85 0.69  CALCIUM 8.8* 9.3        Intake/Output Summary (Last 24 hours) at 04/04/2023 1109 Last data filed at 04/04/2023 0759 Gross per 24 hour  Intake 834 ml  Output --  Net 834 ml       Pressure Injury 02/09/23 Heel Left Stage 3 -  Full thickness tissue loss. Subcutaneous fat may be visible but bone, tendon or muscle are NOT exposed. (Active)  02/09/23 0543  Location: Heel  Location Orientation: Left  Staging: Stage 3 -  Full thickness tissue loss. Subcutaneous fat may be visible but bone, tendon or muscle are NOT exposed.  Wound Description (Comments):   Present on Admission: No    Physical Exam: Vital Signs Blood pressure 97/66, pulse 73, temperature 98.3 F (36.8 C), resp. rate 18, height 5\' 1"  (1.549 m), weight 51.8 kg, SpO2 98%.   General: awake, alert, in bed but  getting assisted to get up to urinate; NAD HENT: conjugate gaze; oropharynx moist, dons glasses independently CV: regular rate and rhythm; no JVD Pulmonary: CTA B/L; no W/R/R- good air movement GI: soft, NT, ND, (+)BS throughout Psychiatric: engaging, interactive, impulsive at times  Skin: + Scaling dried skin on bilateral heels  - 2 small excoriations, sore on the base of the 5th metatarsal head -no longer present - rash on face - no longer present - stage 2 to right toe -no longer present  + area of redness between left thumb and index finger-now minimal, purple bruise left buttock unstageable - not examined +redness to bilateral elbows -not appreciated + Left heel stage III DTI, covered in foam dressing  Neurologic: AAOx2,  to year but not month or date. Follows most simple commands.  + Hypophonation, speech intelligability much improved MAS 1 right elbow,MAS 1 in RIght finger and wrist flexors  MAS 3 knee flexors, and MAS 3 L>R  hip adductors  MAS 3 L elbow , MAS 3-4 in L wrist and finger flexors  + LUE WHO--- Hypersensitivity to left hand improved, +left  hand contracture, exam stable 11/5  Motor 0/5 strength in LUE and LLE, showing volitional movements in right lower extremity but difficulty to obtain MMT due to her pain/braces, able to do upper body dressing now! Ambulated with RW! Exam stable 11/20  Twice daily for right upper extremity 1/5 finger flexion, otherwise no volitional movement of left upper extremity or bilateral lowers + BL UE WHO.    Musculoskeletal: reduced bilateral shoulder and elbow ROM due to increased tone , pain with passive knee extension bilaterally   . GU: bladder incontinence, foul smelling urine  Assessment/Plan: 1. Functional deficits which require 3+ hours per day of interdisciplinary therapy in a comprehensive inpatient rehab setting. Physiatrist is providing close team supervision and 24 hour management of active medical problems listed  below. Physiatrist and rehab team continue to assess barriers to discharge/monitor patient progress toward functional and medical goals  Care Tool:  Bathing    Body parts bathed by patient: Face, Abdomen, Chest, Left upper leg, Right upper leg, Left arm, Front perineal area   Body parts bathed by helper: Right arm, Buttocks, Right lower leg, Left lower leg Body parts n/a: Right arm, Left arm, Front perineal area, Buttocks, Right upper leg, Left upper leg, Right lower leg, Left lower leg   Bathing assist Assist Level: Moderate Assistance - Patient 50 - 74%     Upper Body Dressing/Undressing Upper body dressing   What is the patient wearing?: Pull over shirt    Upper body assist Assist Level: Contact Guard/Touching assist    Lower Body Dressing/Undressing Lower body dressing      What is the patient wearing?: Incontinence brief, Pants     Lower body assist Assist for lower body dressing: Maximal Assistance - Patient 25 - 49%     Toileting Toileting Toileting Activity did not occur (Clothing management and hygiene only):  (incontient and required total for clean u)  Toileting assist Assist for toileting: Maximal Assistance - Patient 25 - 49%     Transfers Chair/bed transfer  Transfers assist  Chair/bed transfer activity did not occur: Safety/medical concerns  Chair/bed transfer assist level: Moderate Assistance - Patient 50 - 74%     Locomotion Ambulation   Ambulation assist   Ambulation activity did not occur: Safety/medical concerns  Assist level: Moderate Assistance - Patient 50 - 74% Assistive device: Walker-rolling Max distance: 4ft   Walk 10 feet activity   Assist  Walk 10 feet activity did not occur: Safety/medical concerns  Assist level: Moderate Assistance - Patient - 50 - 74% Assistive device: Walker-rolling   Walk 50 feet activity   Assist Walk 50 feet with 2 turns activity did not occur: Safety/medical concerns         Walk 150  feet activity   Assist Walk 150 feet activity did not occur: Safety/medical concerns         Walk 10 feet on uneven surface  activity   Assist Walk 10 feet on uneven surfaces activity did not occur: Safety/medical concerns         Wheelchair     Assist Is the patient using a wheelchair?: Yes Type of Wheelchair: Manual    Wheelchair assist level: Dependent - Patient 0% Max wheelchair distance: 68ft    Wheelchair 50 feet with 2 turns activity    Assist    Wheelchair 50 feet with 2 turns activity did not occur: Safety/medical concerns   Assist Level: Dependent - Patient 0%   Wheelchair 150 feet activity  Assist  Wheelchair 150 feet activity did not occur: Safety/medical concerns   Assist Level: Dependent - Patient 0%   Blood pressure 97/66, pulse 73, temperature 98.3 F (36.8 C), resp. rate 18, height 5\' 1"  (1.549 m), weight 51.8 kg, SpO2 98%.  Medical Problem List and Plan: 1. 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  Impaired mobility: continue Lovenox 30mg  daily, weekly creatinine ordered             -antiplatelet therapy: N/A  3. Pain:  -Knee pain: Tylenol as needed, continue voltaren gel scheduled. XR reviewed and is stable, discussed with therapy -Right wrist swelling: XR ordered and shows no acute fracture, shows 2.78mm ulnar variance, brace removed to given patient a break from it. Voltaren gel ordered prn for discomfort, continue, CT ordered and show no acute osseus injury, continue ace wrap   -Foot pain: tramadol ordered prn, improved today,  continue prn  -Headaches: continue tylenol and tramadol prn -Low back pain: continue kpad, asked [place] to please bring for her -Whole body hypersensitivity: gabapentin 100mg  TID ordered  4. History of anxiety: discussed with her mom that she feels this was a big risk factor for her accident, propanolol d/ced due to hypotension. Continue magnesium supplement  5. Neuropsych/cognition: This patient is not capable of making decisions on her own behalf  - 10/12: Still not capable of medical decision making; BUT oriented and communicating, following directions - MUCH improved -Will add PRN melatonin 3mg  10/20 for insomnia  6. Skin: -Left buttock unstageable ulcer.  Continue Medihoney to buttock wound daily cover with foam dressing.  Changing dressing every 3 days or as needed soiling -Left hand skin maceration: Eucerin ordered, discussed hand split with OT, continue -Area of redness between left thumb and index finger: continue foam dressing -Scalp eczema: selsun blue ordered, continue -9/7 patient's mother feels like this is causing her to itch, will start some hydrocortisone cream -Small sore on the base of 5th metatarsal head: local wound care, continue -Rash on face: resolved, discussed with mother could have been secondary to added sugars in nutritional supplements -Sacral pressure injury: air mattress ordered, Placed nursing order to offload q1 hour while awake, continue medihoney, wound care consulted, discussed with nursing that this is improving -Blood blister to left heel: continue foam heel protectors.  7. Fluids/Electrolytes/Nutrition: Routine in and outs with follow-up chemistries  -Eating  well with cueing   -Dysphagia: continue D3/thins--tolerating this diet -Low protein stores: d/c prosource since patient hates these and is eating 100% of meals -Suboptimal vitamin D level: increase to 5,000 U daily. continue this dose  -History of magnesium deficiency: magnesium 500mg  started  HS  8.  Cavitary right lower lobe pneumonia likely aspiration pneumonia/MRSA pneumonia.  Resolved    Latest Ref Rng & Units 04/04/2023    6:09 AM 02/05/2023   11:12 AM 01/22/2023    6:36 AM  CBC  WBC 4.0 - 10.5 K/uL 5.6  8.5  8.1   Hemoglobin 12.0 - 15.0 g/dL 29.5  62.1  30.8   Hematocrit 36.0 -  46.0 % 42.1  46.7  47.5   Platelets 150 - 400 K/uL 224  284  274      9.  History of drug abuse.  Positive cocaine on urine drug screen.  Provide counseling  10.  AKI/hypovolemia and ATN.  Resolved Appears to have fair fluid intake, creatinine has been stable  11/3- Push fluids due ot low BP  -03/24/23 Cr stable yesterday, BPs stable, monitor    Latest Ref Rng & Units 04/04/2023    6:09 AM 04/02/2023    3:39 PM 03/30/2023    5:35 AM  BMP  Glucose 70 - 99 mg/dL 91  85    BUN 6 - 20 mg/dL 20  17    Creatinine 1.61 - 1.00 mg/dL 0.96  0.45  4.09   Sodium 135 - 145 mmol/L 138  136    Potassium 3.5 - 5.1 mmol/L 3.9  4.1    Chloride 98 - 111 mmol/L 105  100    CO2 22 - 32 mmol/L 24  26    Calcium 8.9 - 10.3 mg/dL 9.3  8.8      11.  Mild transaminitis with rhabdomyolysis.  Both resolved  12.  Hypotension. Resolved, d/c midodrine -d/c propanolol, decrease baclofen to 10mg  HS>> back up to 20mg  QHS, decrease tizanidine to 2mg  TID 11/2- BP was running 87 systolic last night- and max of 110 in last 24 hours- not dizzy per chart, but if develops any dizziness, suggest primary team restarts Midodrine. Amantadine can cause hypotension, FYI Decrease tizanidine to BID  Vitals:   03/31/23 1532 03/31/23 1925 04/01/23 0522 04/01/23 1258  BP: 96/63 113/87 103/77 97/64   04/01/23 1922 04/02/23 0402 04/02/23 1550 04/02/23 2040  BP: (!) 94/59 100/71 95/66 (!) 87/48   04/03/23 0432 04/03/23 1501 04/03/23 1932 04/04/23 0509  BP: (!) 92/59 (!) 94/53 90/63 97/66    13. HTN: see above Increase tizanidine to 6mg  TID--now 4mg  TID>>2mg  TID. Magnesium supplement added HS. Increase propanolol to 20mg  TID>> d/c'd,  d/c clonidine patch given hypotension  14. Impaired initiation: continue amantadine 100mg  BID  15. Hyperglycemia: CBGs mildly elevated, d/ced checks. Prosource d/ced as per patient's request  16. Spasticity:  -discussed serial casting with OT and her mom but she defers given pain she had when this was attempted -baclofen and tizanidine decreased due to hypotension, spasticity has been stable. Continue wrist, elbow brace, and PRAFOs. Ordered a night splint for LLE  Repeat CMP today given chronic tizanidine use -9/7 continues to have significant tone despite use of tizanidine and baclofen, may be beneficial consider baclofen pump in outpatient setting if this causes significant difficulties in care  Decrease tizanidine to BID  17.  Dyskinetic movements in head and neck - appears to be myoclonic jerks related to hypoxic brain injury - as discussed with pt mom, no specific meds for this on antispasticity meds , consioder benzo although with pt's SUD would avoid this class - EEG reviewed and was negative for seizure.  -Decrease baclofen to 15mg  TID>> down to 10mg  at bedtime>> now up to 20mg  QHS  18. Cervical extensor weakness: tried decreasing tizanidine but spasticity appears increased and BP elevated so will increase back to 6mg  TID, continue this dose, continue cervical collar>> now on 4mg  TID>> down to 2mg  TID  19. Bowel and bladder incontinence: continue bowel and bladder program -03/31/23 no BM since 03/28/23, if no BM by tomorrow then may need to add meds for BM -04/01/23 still no BM, will start miralax  every day, give sorbitol x1, added PRN dulcolax suppository; monitor for effects  20. Fatigue: improved, discussed with team may be secondary to anti-spasticity medications, B12 reviewed and is 335, in suboptimal range, will give 1,000U B12 injection 8/7, will order monthly as needed, metanx started, increase to BID, continue, decrease tizanidine to BID, continue this dose  21. MRSA nares  positive: negative on repeat, precautions d/ced  22. Tachycardic: resolved, d/c propanolol.  Continue Tizanidine increased to 6mg  TID>> now down to 2mg  TID. Resolved, amantadine increased back to 100mg  BID, continue this dose   23. S/p fall: CT head reviewed and is stable  24. Bilateral foot drop: bilateral AFOs ordered, discussed that these can help her to walk better  25. Vaginal itching: clotrimazole ordered, discussed with patient, does not appear to be helping so will switch to fluconazole  26. Dysuria: UA/UC ordered, discussed negative  27. Foul smelling urine: asked nursing to encourage 6-8 glasses of water per day, fluconazole started for potential fungal infection given itching as well and negative [place]  28. Pain in left heel: XR obtained and negative    LOS: 137 days A FACE TO FACE EVALUATION WAS PERFORMED  Roshana Shuffield P Laney Louderback 04/04/2023, 11:09 AM

## 2023-04-04 NOTE — Progress Notes (Signed)
Speech Language Pathology Daily Session Note  Patient Details  Name: Nicole [name] MRN: [id number] Date of Birth: August 11, 1975  Today's Date: 04/04/2023 SLP Individual Time: 1415-1500 SLP Individual Time Calculation (min): 45 min  Short Term Goals: Week 9: SLP Short Term Goal 1 (Week 9): Week 19 - Pt will solve basic to mildly complex problems with 90% accuracy given min assist. SLP Short Term Goal 2 (Week 9): Week 19 -Patient will utilize external aids for orientation to place and time with supervision level verbal cues SLP Short Term Goal 3 (Week 9): Week 19 - Pt will demonstrate sustained attention to basic tasks for 15 minutes with Min A multimodal cues for redirection. SLP Short Term Goal 4 (Week 9): Week 19 - Pt will utilize increased vocal intensity w/ supervision verbal cues to remain 90% intelligibile at sentence level  Skilled Therapeutic Interventions: SLP conducted skilled therapy session targeting cognitive retraining goals. SLP guided patient through handwriting practice to work towards creating memory book. Patient reports having practiced handwriting between sessions with several pages of semi-legible writing present in notebook. SLP provided sentences for patient to copy, patient required mod-max assist to reduce impulsivity during writing tasks. SLP then guided patient through creation of a memory log of today's date. Patient disoriented to month but did orient with min cues. Patient requested assistance to commode where she was incontinent of bladder. NT assisted with changing of brief and clothing with patient benefiting from supervision-min cues for safety during transfer via [place] to and from commode. Patient was left in chair with call bell in reach and chair alarm set. SLP will continue to target goals per plan of care.       Pain Pain Assessment Pain Scale: 0-10 Pain Score: 0-No pain  Therapy/Group: Individual Therapy  [name], M.A., CCC-SLP  [name] 04/04/2023, 3:39 PM

## 2023-04-04 NOTE — Progress Notes (Signed)
Patient ID: Nicole [name], female   DOB: 10-02-1975, 47 y.o.   MRN: [id number]  Spoke with [facility] who reports pt's disability is still pending, everything is in and no other information needed. Now just waiting even though her long term medicaid has been approved.

## 2023-04-04 NOTE — Progress Notes (Signed)
Occupational Therapy Session Note  Patient Details  Name: Nicole [name] MRN: [id number] Date of Birth: [birth year]  Today's Date: 04/04/2023 OT Individual Time: 1130-1205 and 1305-1405 OT Individual Time Calculation (min): 35 min and 60 min   Short Term Goals: Week 9: OT Short Term Goal 1 (Week 9): week 18: Pt will transfer to toilet with mod A with RW OT Short Term Goal 2 (Week 9): Week 18: Pt will be able to come to EOB in prep for ADLs with min A OT Short Term Goal 3 (Week 9): Pt will feed self 50% of meal with min A OT Short Term Goal 4 (Week 9): Week 18: Pt will use external aids to be oriented x3 with mod A  Skilled Therapeutic Interventions/Progress Updates:    AM Session:  Patient received seated in wheelchair.  Note received from prior OT that patient wanting to address shoulder pain in next session.  Patient transported to gym and transferred to mat table with min assist modified squat pivot.  Patient transitioned to supine with min assist to manage left LE.  Patient reports hip pain in left hip with knee/ hip extended, improved with knee/ hip flexion.  Provided scapular mobility to each shoulder, discouraging cervical flexion - pulling on shoulder.  Worked on rolling to sidelying to promote trunk extension and improved scapular mobility during transitional movements.  Left up in wheelchair with safety belt in place and engaged and personal items in reach.   PM Session:  Patient received seated in wheelchair agreeable to shower.  Patient asking for her "slides" - found water shoes in drawer.  Patient able to identify a process for shower.  Did active stand step transfer from wheelchair to padded shower seat. Patient able to maintain sitting for shower and reach to lower leg.  Difficulty to stand with use of grab bar - sufficient to wash buttocks.  Patient able to direct steps of shower, and also for steps of grooming and dressing.  Patient with improved ability to reach to feet and move  feet up toward a figure four position.  At times patient indicates boot on left foot is helping with pain in heel, and at times she indicates it is not helping.  Patient declined wearing padded boot.  Continued focus on functional mobility as related to bathing and dressing tasks - sit to stand - patient dependent on use of grab bar, or solid surface for that transition.  Left up in wheelchair with safety belt in place and engaged and personal items in reach.    Therapy Documentation Precautions:  Precautions Precautions: Fall Precaution Comments: delayed processing, Increased tone UB and LB Required Braces or Orthoses: Other Brace Other Brace: B adjustable night splints; B elbow "cosey" splints; L palm protector; R mitt Restrictions Weight Bearing Restrictions: No  AM Pain:  Reports pain in bilateral shoulders 4/10  PM Pain:  Reports pain improved in bilateral shoulders 2/10      Therapy/Group: Individual Therapy  [name] 04/04/2023, 3:06 PM

## 2023-04-04 NOTE — Consult Note (Addendum)
WOC Nurse Consult Note: Pt was noted to have a Stage 3 pressure injury to right heel on PIP survey date.  WOC team reassessed the location today: .3X.3X.1cm, red and dry; Pt has been wearing a splint.boot over the affected area and it is difficult to reduce pressure.  Pressure Injury POA: No Foam dressing in place to protect from further injury. WOC team will re-assess weekly to determine if a change in the plan of care is indicated at that time.  Thank-you,  Cammie Mcgee MSN, RN, CWOCN, [place], CNS [phone]

## 2023-04-04 NOTE — Patient Care Conference (Signed)
Inpatient RehabilitationTeam Conference and Plan of Care Update Date: 04/04/2023   Time: 11:07 AM    Patient Name: Nicole [name]      Medical Record Number: [id number]  Date of Birth: January 22, 1976 Sex: Female         Room/Bed: 4W09C/4W09C-01 Payor Info: Payor: [employer] / Plan: [employer] / Product Type: *No Product type* /    Admit Date/Time:  11/18/2022  3:15 PM  Primary Diagnosis:  Hypoxic brain injury [facility])  Hospital Problems: Principal Problem:   Hypoxic brain injury (HCC) Active Problems:   Protein-calorie malnutrition, severe   Essential hypertension   Pressure injury of skin    Expected Discharge Date: Expected Discharge Date:  (SNF pending)  Team Members Present: Physician leading conference: Dr. Sula Soda Social Worker Present: Dossie Der, LCSW Nurse Present: Chana Bode, RN PT Present: Wynelle Link, PT OT Present: Bretta Bang, OT SLP Present: [name], SLP PPS Coordinator present : Fae Pippin, SLP     Current Status/Progress Goal Weekly Team Focus  Bowel/Bladder   patient is incontinent of b/b LBM 11/19 multiple   regain continence   offer toileting q 2 hrs and PRN    Swallow/Nutrition/ Hydration               ADL's   MIn A bed mobility, Min sitting balance, Mod A step pivot transfer using RW, set-up self feeding, set-up grooming (oral care, brushing hair), Set-up UB bathing, Min A UB dressing, Max A LB dressing, total A LB bathing. Severe pain with left heel with touch and when donning shoes.   Goals min overall   core stability to improve sitting balance, hip extension to increase functional transfers, BUE ROM    Mobility   Mod A bed mobility, min A sitting balance, mod/max A squat pivot transfers, min A sit<>stand with RW, min/mod A gait 91ft with RW, Max A stairs   Goals upgraded to MIn A  Postural control, functional transfers, gait training, functional mobility, AFO trails    Communication    decreased vocal intensity which improves with verbal cues. Greater instances of self monitoring and correcting observed   mod A at the sentence/conversation level   progress toward improving speech intelligbility in a variety of settings/ situations    Safety/Cognition/ Behavioral Observations  improving attention and basic problem solving. Continues to require frequent repetition and carryover of temporal concepts remains poor despite awareness of being in hospital for 4 months.   mod A   orientation, sustained attention, mildly complex problem solving    Pain   c/o pain right foot and lower/mid back   <3 on pain scale   assess pain q shift and PRN    Skin   skin intact boggy right heel patient does not want to wear boots. redness on bottom using cream. vaginal itching   improve skin integrity  encourage to wear boots or floating heels, encourage q2 turns      Discharge Planning:  Continue to search for a long term care medicaid bed without success. Search has reached 100 miles   Team Discussion: Patient post hypoxic brain injury with spasms. Vaginal burning/itching addressed. Foot pain; x-ray WNL, CAM boot added.    Patient on target to meet rehab goals: yes, currently needs Set-up UB bathing, Min A UB dressing, Max A LB dressing, total A LB bathing.   *See Care Plan and progress notes for long and short-term goals.   Revisions to Treatment Plan:  Working  on handwriting Memory book   Teaching Needs: Safety, medications, transfers, toileting, etc.   Current Barriers to Discharge: Home enviroment access/layout, Incontinence, Wound care, and Lack of/limited family support  Possible Resolutions to Barriers: SNF pending     Medical Summary Current Status: stage 3 pressure injury to heel, hypotension, pain in knee and heel, vaginismus  Barriers to Discharge: Medical stability  Barriers to Discharge Comments: stage 3 pressure injury to heel, hypotension, pain in knee and  heel, vaginismus Possible Resolutions to [employer] Focus: cam boot ordered to offload pressure injury of heel. decreased tizanidine to 2mg  BID, updated mother, clotrimazole ordered but was ineffective so switched to fluconazole, continue to monitor BP TID   Continued Need for Acute Rehabilitation Level of Care: The patient requires daily medical management by a physician with specialized training in physical medicine and rehabilitation for the following reasons: Direction of a multidisciplinary physical rehabilitation program to maximize functional independence : Yes Medical management of patient stability for increased activity during participation in an intensive rehabilitation regime.: Yes Analysis of laboratory values and/or radiology reports with any subsequent need for medication adjustment and/or medical intervention. : Yes   I attest that I was present, lead the team conference, and concur with the assessment and plan of the team.   Chana Bode B 04/04/2023, 3:02 PM

## 2023-04-05 NOTE — Progress Notes (Signed)
PROGRESS NOTE   Subjective/Complaints: No new complaints this morning Feels that boot lessened her heel pain, discussed using PRAFO at night to offload heel  ROS:  Pt denies SOB, abd pain, CP, N/V/D, and vision changes  +bladder incontinence, +migraines- well controlled, +spasticity- stable, +impaired ambulation, +low back pain- continues, +whole body hypersensitivity, +vaginal itching, +foul smelling urine as per therapy, +heel pain pain improved  Objective:   DG Foot 2 Views Left  Result Date: 04/03/2023 CLINICAL DATA:  Left foot pain EXAM: LEFT FOOT - 2 VIEW COMPARISON:  None Available. FINDINGS: There is no evidence of fracture or dislocation. There is no evidence of arthropathy or other focal bone abnormality. Soft tissues are unremarkable. IMPRESSION: Negative. Electronically Signed   By: Charlett Nose M.D.   On: 04/03/2023 21:28   Recent Labs    04/04/23 0609  WBC 5.6  HGB 14.1  HCT 42.1  PLT 224       Recent Labs    04/02/23 1539 04/04/23 0609  NA 136 138  K 4.1 3.9  CL 100 105  CO2 26 24  GLUCOSE 85 91  BUN 17 20  CREATININE 0.85 0.69  CALCIUM 8.8* 9.3        Intake/Output Summary (Last 24 hours) at 04/05/2023 1056 Last data filed at 04/05/2023 0827 Gross per 24 hour  Intake 1020 ml  Output --  Net 1020 ml       Pressure Injury 02/09/23 Heel Left Stage 3 -  Full thickness tissue loss. Subcutaneous fat may be visible but bone, tendon or muscle are NOT exposed. (Active)  02/09/23 0543  Location: Heel  Location Orientation: Left  Staging: Stage 3 -  Full thickness tissue loss. Subcutaneous fat may be visible but bone, tendon or muscle are NOT exposed.  Wound Description (Comments):   Present on Admission: No    Physical Exam: Vital Signs Blood pressure (!) 96/59, pulse 74, temperature 97.9 F (36.6 C), temperature source Oral, resp. rate 17, height 5\' 1"  (1.549 m), weight 51.8 kg, SpO2  100%.   General: awake, alert, in bed but getting assisted to get up to urinate; NAD HENT: conjugate gaze; oropharynx moist, dons glasses independently CV: regular rate and rhythm; no JVD Pulmonary: CTA B/L; no W/R/R- good air movement GI: soft, NT, ND, (+)BS throughout Psychiatric: engaging, interactive, impulsive at times  Skin: + Scaling dried skin on bilateral heels  - 2 small excoriations, sore on the base of the 5th metatarsal head -no longer present - rash on face - no longer present - stage 2 to right toe -no longer present  + area of redness between left thumb and index finger-now minimal, purple bruise left buttock unstageable - not examined +redness to bilateral elbows -not appreciated + Left heel stage III DTI, covered in foam dressing  Neurologic: AAOx2,  to year but not month or date. Follows most simple commands.  + Hypophonation, speech intelligability much improved MAS 1 right elbow,MAS 1 in RIght finger and wrist flexors  MAS 3 knee flexors, and MAS 3 L>R  hip adductors  MAS 3 L elbow , MAS 3-4 in L wrist and finger flexors  + LUE WHO--- Hypersensitivity  to left hand improved, +left hand contracture, exam stable 11/5  Motor 0/5 strength in LUE and LLE, showing volitional movements in right lower extremity but difficulty to obtain MMT due to her pain/braces, able to do upper body dressing now! Ambulated with RW! Exam stable 11/21  Twice daily for right upper extremity 1/5 finger flexion, otherwise no volitional movement of left upper extremity or bilateral lowers + BL UE WHO.    Musculoskeletal: reduced bilateral shoulder and elbow ROM due to increased tone , pain with passive knee extension bilaterally   . GU: bladder incontinence, foul smelling urine  Assessment/Plan: 1. Functional deficits which require 3+ hours per day of interdisciplinary therapy in a comprehensive inpatient rehab setting. Physiatrist is providing close team supervision and 24 hour  management of active medical problems listed below. Physiatrist and rehab team continue to assess barriers to discharge/monitor patient progress toward functional and medical goals  Care Tool:  Bathing    Body parts bathed by patient: Face, Abdomen, Chest, Left upper leg, Right upper leg, Left arm, Front perineal area, Right arm, Right lower leg   Body parts bathed by helper: Left lower leg, Buttocks Body parts n/a: Right arm, Left arm, Front perineal area, Buttocks, Right upper leg, Left upper leg, Right lower leg, Left lower leg   Bathing assist Assist Level: Moderate Assistance - Patient 50 - 74%     Upper Body Dressing/Undressing Upper body dressing   What is the patient wearing?: Pull over shirt    Upper body assist Assist Level: Set up assist    Lower Body Dressing/Undressing Lower body dressing      What is the patient wearing?: Incontinence brief, Pants     Lower body assist Assist for lower body dressing: Maximal Assistance - Patient 25 - 49%     Toileting Toileting Toileting Activity did not occur (Clothing management and hygiene only): N/A (no void or bm)  Toileting assist Assist for toileting: Maximal Assistance - Patient 25 - 49%     Transfers Chair/bed transfer  Transfers assist  Chair/bed transfer activity did not occur: Safety/medical concerns  Chair/bed transfer assist level: Minimal Assistance - Patient > 75%     Locomotion Ambulation   Ambulation assist   Ambulation activity did not occur: Safety/medical concerns  Assist level: Moderate Assistance - Patient 50 - 74% Assistive device: Walker-rolling Max distance: 42ft   Walk 10 feet activity   Assist  Walk 10 feet activity did not occur: Safety/medical concerns  Assist level: Moderate Assistance - Patient - 50 - 74% Assistive device: Walker-rolling   Walk 50 feet activity   Assist Walk 50 feet with 2 turns activity did not occur: Safety/medical concerns         Walk 150  feet activity   Assist Walk 150 feet activity did not occur: Safety/medical concerns         Walk 10 feet on uneven surface  activity   Assist Walk 10 feet on uneven surfaces activity did not occur: Safety/medical concerns         Wheelchair     Assist Is the patient using a wheelchair?: Yes Type of Wheelchair: Manual    Wheelchair assist level: Dependent - Patient 0% Max wheelchair distance: 31ft    Wheelchair 50 feet with 2 turns activity    Assist    Wheelchair 50 feet with 2 turns activity did not occur: Safety/medical concerns   Assist Level: Dependent - Patient 0%   Wheelchair 150 feet activity  Assist  Wheelchair 150 feet activity did not occur: Safety/medical concerns   Assist Level: Dependent - Patient 0%   Blood pressure (!) 96/59, pulse 74, temperature 97.9 F (36.6 C), temperature source Oral, resp. rate 17, height 5\' 1"  (1.549 m), weight 51.8 kg, SpO2 100%.  Medical Problem List and Plan: 1. 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  Impaired mobility: continue Lovenox 30mg  daily, weekly creatinine ordered             -antiplatelet therapy: N/A  3. Pain:  -Knee pain: Tylenol as needed, continue voltaren gel scheduled. XR reviewed and is stable, discussed with therapy -Right wrist swelling: XR ordered and shows no acute fracture, shows 2.32mm ulnar variance, brace removed to given patient a break from it. Voltaren gel ordered prn for discomfort, continue, CT ordered and show no acute osseus injury, continue ace wrap   -Foot pain: tramadol  ordered prn, improved today, continue prn  -Headaches: continue tylenol and tramadol prn -Low back pain: continue kpad, asked [place] to please bring for her -Whole body hypersensitivity: gabapentin 100mg  TID ordered  4. History of anxiety: discussed with her mom that she feels this was a big risk factor for her accident, propanolol d/ced due to hypotension. Continue magnesium supplement  5. Neuropsych/cognition: This patient is not capable of making decisions on her own behalf  - 10/12: Still not capable of medical decision making; BUT oriented and communicating, following directions - MUCH improved -Will add PRN melatonin 3mg  10/20 for insomnia  6. Skin: -Left buttock unstageable ulcer.  Continue Medihoney to buttock wound daily cover with foam dressing.  Changing dressing every 3 days or as needed soiling -Left hand skin maceration: Eucerin ordered, discussed hand split with OT, continue -Area of redness between left thumb and index finger: continue foam dressing -Scalp eczema: selsun blue ordered, continue -9/7 patient's mother feels like this is causing her to itch, will start some hydrocortisone cream -Small sore on the base of 5th metatarsal head: local wound care, continue -Rash on face: resolved, discussed with mother could have been secondary to added sugars in nutritional supplements -Sacral pressure injury: air mattress ordered, Placed nursing order to offload q1 hour while awake, continue medihoney, wound care consulted, discussed with nursing that this is improving -Blood blister to left heel: continue foam heel protectors.  7. Fluids/Electrolytes/Nutrition: Routine in and outs with follow-up chemistries  -Eating  well with cueing   -Dysphagia: continue D3/thins--tolerating this diet -Low protein stores: d/c prosource since patient hates these and is eating 100% of meals -Suboptimal vitamin D level: increase to 5,000 U daily. continue this dose  -History of magnesium  deficiency: magnesium 500mg  started HS  8.  Cavitary right lower lobe pneumonia likely aspiration pneumonia/MRSA pneumonia.  Resolved    Latest Ref Rng & Units 04/04/2023    6:09 AM 02/05/2023   11:12 AM 01/22/2023    6:36 AM  CBC  WBC 4.0 - 10.5 K/uL 5.6  8.5  8.1   Hemoglobin 12.0 - 15.0 g/dL 74.2  59.5  63.8  Hematocrit 36.0 - 46.0 % 42.1  46.7  47.5   Platelets 150 - 400 K/uL 224  284  274      9.  History of drug abuse.  Positive cocaine on urine drug screen.  Provide counseling  10.  AKI/hypovolemia and ATN.  Resolved Appears to have fair fluid intake, creatinine has been stable  11/3- Push fluids due ot low BP  -03/24/23 Cr stable yesterday, BPs stable, monitor    Latest Ref Rng & Units 04/04/2023    6:09 AM 04/02/2023    3:39 PM 03/30/2023    5:35 AM  BMP  Glucose 70 - 99 mg/dL 91  85    BUN 6 - 20 mg/dL 20  17    Creatinine 1.02 - 1.00 mg/dL 7.25  3.66  4.40   Sodium 135 - 145 mmol/L 138  136    Potassium 3.5 - 5.1 mmol/L 3.9  4.1    Chloride 98 - 111 mmol/L 105  100    CO2 22 - 32 mmol/L 24  26    Calcium 8.9 - 10.3 mg/dL 9.3  8.8      11.  Mild transaminitis with rhabdomyolysis.  Both resolved  12.  Hypotension. Resolved, d/c midodrine -d/c propanolol, decrease baclofen to 10mg  HS>> back up to 20mg  QHS, decrease tizanidine to 2mg  TID 11/2- BP was running 87 systolic last night- and max of 110 in last 24 hours- not dizzy per chart, but if develops any dizziness, suggest primary team restarts Midodrine. Amantadine can cause hypotension, FYI Decrease tizanidine to BID  Vitals:   04/01/23 1258 04/01/23 1922 04/02/23 0402 04/02/23 1550  BP: 97/64 (!) 94/59 100/71 95/66   04/02/23 2040 04/03/23 0432 04/03/23 1501 04/03/23 1932  BP: (!) 87/48 (!) 92/59 (!) 94/53 90/63   04/04/23 0509 04/04/23 1304 04/04/23 2017 04/05/23 0259  BP: 97/66 95/75 (!) 91/55 (!) 96/59   13. HTN: see above Increase tizanidine to 6mg  TID--now 4mg  TID>>2mg  TID. Magnesium supplement added  HS. Increase propanolol to 20mg  TID>> d/c'd, d/c clonidine patch given hypotension  14. Impaired initiation: continue amantadine 100mg  BID  15. Hyperglycemia: CBGs mildly elevated, d/ced checks. Prosource d/ced as per patient's request  16. Spasticity:  -discussed serial casting with OT and her mom but she defers given pain she had when this was attempted -baclofen and tizanidine decreased due to hypotension, spasticity has been stable. Continue wrist, elbow brace, and PRAFOs. Ordered a night splint for LLE  Repeat CMP today given chronic tizanidine use -9/7 continues to have significant tone despite use of tizanidine and baclofen, may be beneficial consider baclofen pump in outpatient setting if this causes significant difficulties in care  Decrease tizanidine to BID  17.  Dyskinetic movements in head and neck - appears to be myoclonic jerks related to hypoxic brain injury - as discussed with pt mom, no specific meds for this on antispasticity meds , consioder benzo although with pt's SUD would avoid this class - EEG reviewed and was negative for seizure.  Baclofen decreased to 20mg  HS  18. Cervical extensor weakness: tizanidine decreased to 2mg  BID  19. Bowel and bladder incontinence: continue bowel and bladder program. Conitnue magneisum supplement  20. Fatigue: improved, discussed with team may be secondary to anti-spasticity medications, B12 reviewed and is 335, in suboptimal range, will give 1,000U B12 injection 8/7, will order monthly as needed, metanx started, increase to BID, continue, decrease tizanidine to BID, continue this dose  21. MRSA nares positive: negative on repeat,  precautions d/ced  22. Tachycardic: resolved, d/c propanolol.  Continue Tizanidine increased to 6mg  TID>> now down to 2mg  TID. Resolved, amantadine increased back to 100mg  BID, continue this dose   23. S/p fall: CT head reviewed and is stable  24. Bilateral foot drop: bilateral AFOs ordered, discussed that  these can help her to walk better  25. Vaginal itching: clotrimazole ordered, discussed with patient, does not appear to be helping so will switch to fluconazole  26. Dysuria: UA/UC ordered, discussed negative  27. Foul smelling urine: asked nursing to encourage 6-8 glasses of water per day, fluconazole started for potential fungal infection given itching as well and negative [place]  28. Pain in left heel: XR obtained and negative, cam boot ordered to help offload heel with therapy    LOS: 138 days A FACE TO FACE EVALUATION WAS PERFORMED  Clint Bolder P Joely Losier 04/05/2023, 10:56 AM

## 2023-04-05 NOTE — Progress Notes (Signed)
Recreational Therapy Session Note  Patient Details  Name: Nicole [name] MRN: [id number] Date of Birth: Jan 08, 1976 Today's Date: 04/05/2023  Pt politely declined therapy session this afternoon due to fatigue after morning sessions.  Pt requesting to take a nap.  Assisted pt with repositioning, all needs within reach. Cleo Villamizar 04/05/2023, 1:44 PM

## 2023-04-05 NOTE — Progress Notes (Signed)
Physical Therapy Session Note  Patient Details  Name: Nicole [name] MRN: [id number] Date of Birth: February 17, 1976  Today's Date: 04/05/2023 PT Individual Time: 4098-1191 PT Individual Time Calculation (min): 72 min   Short Term Goals: Week 1:  PT Short Term Goal 1 (Week 1): Pt will ambulate 51ft with modA and LRAD PT Short Term Goal 1 - Progress (Week 1): Not met PT Short Term Goal 2 (Week 1): Pt will complete functional outcome measure to assess balance and falls risk PT Short Term Goal 2 - Progress (Week 1): Not met PT Short Term Goal 3 (Week 1): Week 13: Pt will complete bed<>chair transfers with totalA (pt <25%) PT Short Term Goal 3 - Progress (Week 1): Not met  Skilled Therapeutic Interventions/Progress Updates:      Pt sitting in w/c to start - no reports of pain. Has her L CAM boot donned and this was doffed at end of session.   Transported in w/c to main rehab gym. We worked on repeated and blocked practice stand step transfers from w/c to a arm chair, using the // bar as a "grab bar" to pull from. She completed several reps of these at [facility] level, needing mod cues for safety and sequencing.   Worked on gait training in // bars 4x72ft with modA - visual and verbal cueing for upright posture to lengthen spine and keep forward gaze. She has a narrow BOS and keeps her hips/trunk flexed resulting in, sometimes, severe forward flexed trunk.   Pt requesting to work on stretching. So she was assisted onto mat table and worked on [employer] and PROM for stretching her hips, groin, adductors, and posterior capsule. She was even assisted into prone position with better tolerance, minA for positioning. In prone, worked on stretching hip flexors with passive hip extension and just lying in prone. She did need assist for managing her LUE to prevent uncomfortable external rotation.   Pt transported to day room rehab gym to participate in Group Dance at wheelchair level. She completed therapeutic movements  targeting rhtyhmic rotation, sequencing, alternating, and challenging her selective attention. Benefited from moderate cues for correct patterns and AAROM for her LUE to promote extension and abduction when appropriate.   Returned to her room and patient requesting to lie in bed 2/2 fatigue. Min/modA squat pivot transfer and maxA for returning to supine. Left in bed with all needs met, L heel floated with a pillow.    Therapy Documentation Precautions:  Precautions Precautions: Fall Precaution Comments: delayed processing, Increased tone UB and LB Required Braces or Orthoses: Other Brace Other Brace: B adjustable night splints; B elbow "cosey" splints; L palm protector; R mitt Restrictions Weight Bearing Restrictions: No General:      Therapy/Group: Individual Therapy  Orrin Brigham 04/05/2023, 7:39 AM

## 2023-04-05 NOTE — Progress Notes (Signed)
Occupational Therapy Session Note  Patient Details  Name: Nicole [name] MRN: [id number] Date of Birth: [birth year]  Today's Date: 04/05/2023 OT Individual Time: 0846-1000 OT Individual Time Calculation (min): 74 min    Short Term Goals: Week 9: OT Short Term Goal 1 (Week 9): week 18: Pt will transfer to toilet with mod A with RW OT Short Term Goal 2 (Week 9): Week 18: Pt will be able to come to EOB in prep for ADLs with min A OT Short Term Goal 3 (Week 9): Pt will feed self 50% of meal with min A OT Short Term Goal 4 (Week 9): Week 18: Pt will use external aids to be oriented x3 with mod A  Skilled Therapeutic Interventions/Progress Updates:    Patient received supine in bed - concerned that she had a BM.  Checked patient and she was in a clean and dry brief.  Patient asking to shower as she had "cobbler in her hair"  and it was "gross."  Patient assisted to shower seat with towels on seat to provide added cushion.  This was a safer option then padded bench with cutout for the shower.  Patient used grab bar to pull to stand and took a few steps into shower to position herself on seat.  Patient able to transitions to to stand several times during shower with assistance for forward weight shift.  Patient indicated need for BM after shower and transferred to toilet with mod assist to ensure adequate weight shift and to monitor left leg.  Patient with improved ability to organize grooming tasks, and dressing tasks and needing only mod assist to get pants over left leg. Patient continues to have tightness in left hip and knee which limit positional needs for LB dressing.  Patient pleased with her performance, and agreeable to remain up in wheelchair with safety belt in place and engaged and call bell in reach.  When asked patient how to turn on TV- she used red nurse call without awareness.  Will continue to monitor.    Therapy Documentation Precautions:  Precautions Precautions: Fall Precaution  Comments: delayed processing, Increased tone UB and LB Required Braces or Orthoses: Other Brace Other Brace: B adjustable night splints; B elbow "cosey" splints; L palm protector; R mitt Restrictions Weight Bearing Restrictions: No   Pain:  No pain indicated this session     Therapy/Group: Individual Therapy  [name] 04/05/2023, 12:46 PM

## 2023-04-05 NOTE — Progress Notes (Signed)
Speech Language Pathology Daily Session Note  Patient Details  Name: Nicole [name] MRN: [id number] Date of Birth: 25-Feb-1976  Today's Date: 04/05/2023 SLP Individual Time: 1430-1530 SLP Individual Time Calculation (min): 60 min  Short Term Goals: Week 9: SLP Short Term Goal 1 (Week 9): Week 19 - Pt will solve basic to mildly complex problems with 90% accuracy given min assist. SLP Short Term Goal 2 (Week 9): Week 19 -Patient will utilize external aids for orientation to place and time with supervision level verbal cues SLP Short Term Goal 3 (Week 9): Week 19 - Pt will demonstrate sustained attention to basic tasks for 15 minutes with Min A multimodal cues for redirection. SLP Short Term Goal 4 (Week 9): Week 19 - Pt will utilize increased vocal intensity w/ supervision verbal cues to remain 90% intelligibile at sentence level  Skilled Therapeutic Interventions: SLP conducted skilled therapy session targeting cognitive retraining goals. Patient greeted partially reclined in bed and agreeable to therapy session. SLP guided patient through filling out memory book. Patient oriented to date with supervision-min assist, recalled daily events with min-mod assist. During cognitive task, patient sustained attention for 30 minutes with supervision. Patient solved basic to mildly complex problems with 75% accuracy given mod assist. Patient was left in lowered bed with call bell in reach and bed alarm set. SLP will continue to target goals per plan of care.       Pain Pain Assessment Pain Scale: 0-10 Pain Score: 0-No pain  Therapy/Group: Individual Therapy  [name], M.A., CCC-SLP  [name] 04/05/2023, 3:58 PM

## 2023-04-06 ENCOUNTER — Inpatient Hospital Stay (HOSPITAL_COMMUNITY): Payer: MEDICAID

## 2023-04-06 NOTE — Progress Notes (Signed)
Physical Therapy Weekly Progress Note  Patient Details  Name: KHALEYA AWADALLA MRN: [id number] Date of Birth: 04/12/1976  Beginning of progress report period: March 28, 2023 End of progress report period: April 06, 2023  Today's Date: 04/06/2023 PT Individual Time: 1100-1200 + 1300-1414 PT Individual Time Calculation (min): 60 min  + 74 min   Ms. Chavira is making excellent progress this past reporting period. She is currently modA for bed mobility, modA for bed<>chair transfers, modA for ambulating ~25-85ft with RW, and maxA for navigating x4 stairs. She has been experiencing increased L Heel pain with ambulation and weight bearing - MD ordered CAM boot to trial which patient finds more comfortable. She is also showing improvement in cognitive initiation/processing and awareness.   Patient continues to demonstrate the following deficits muscle weakness and muscle joint tightness, decreased cardiorespiratoy endurance, abnormal tone, unbalanced muscle activation, motor apraxia, decreased coordination, and decreased motor planning, decreased motor planning, decreased initiation, decreased attention, decreased awareness, decreased problem solving, decreased safety awareness, decreased memory, and delayed processing, and decreased sitting balance, decreased standing balance, decreased postural control, hemiplegia, and decreased balance strategies and therefore will continue to benefit from skilled PT intervention to increase functional independence with mobility.  Patient progressing toward long term goals..  Continue plan of care.  PT Short Term Goals Week 1:  PT Short Term Goal 1 (Week 1): Pt will ambulate 37ft with modA and LRAD PT Short Term Goal 2 (Week 1): Pt will complete functional outcome measure to assess balance and falls risk PT Short Term Goal 3 (Week 1): Week 13: Pt will complete bed<>chair transfers with minA and LRAD (pt <25%)  Skilled Therapeutic Interventions/Progress Updates:      1st session: Pt sitting in w/c on arrival. Agreeable to PT tx. Has the L CAM boot on throughout session.  Transported to main rehab gym at wheelchair level. Sit<>stand to RW with min/modA with cues for hand placement and to avoid tucking her RLE too far underneath her as she attempts to stand. Ambulated ~29ft with min/modA and RW, cues for upright posture, forward gaze, and taking adequate step lengths bilaterally. Continues to struggle with the weight of CAM boot as she fatigues after ~31ft.   Assisted to supine position with minA on mat table. Worked on postural extension and lengthening - used music to help relax her and also give her a timed goal to keep postural extended. Then worked on passive cervical rotation and lateral flexion in all directions as she lay supine, also provided sub-o release to reduce tension and improve ROM.   Assisted patient from supine to prone with mod/maxA - painful L shoulder than needs managed as she transitions, limited abduction and external rotation available. In prone, we worked on passive hip extension, passive knee flexion to target quads and hip flexors for lengthening. Also added soleus stretching bilaterally with knee flexed.   Returned to upright sitting with mod/maxA and had +2 assist for managing her L shoulder. Finished session with blocked practice stand pivot transfers from mat table to w/c with B HHA - min/modA overall and mod cues for safety awareness and technique.   Returned to her room and patient left sitting upright in w/c with seat belt alarm on, call bell within reach.     2nd session: Pt in bed to start - agreeable to therapy.   Donned CAM boot to LLE and tennis shoe to RLE. Supine<>Sitting EOB with modA for initiation and trunk support. Completed stand pivot transfer with  min/modA using B HHA to encourage weight bearing and stepping while she transfers.   Transported to main rehab gym for time. Pt reports she wants to work on walking.    Sit<>Stand to RW with min/modA and ambulates 45ft + 44ft with modA and RW - cues for upright posture, increasing L Heel strike, and taking adequate step lengths bilaterally. Continues to have a very forward flexed trunk and she needs max assist for safely turning to sit at the end of ambulation.  Used RED TB to wrap around ankle, behind knee, and in front of her hips to allow better foot clearance and hip/knee flexion in swing phase. She ambulated another 56ft +74ft with similar assist as above but better gait and improved heel strike on L. She then ambulated another 68ft + 2ft using the grocery cart with min/modA - improved upright posture but did need assist for managing the Grocery Cart to prevent from rolling too far away.   Pt completed stair training using the 6" steps and 2 hand rails. She needed maxA for navigating both directions, heavier assist for going down the stairs. MaxA needed for advancing BLE and support trunk/hips to provide adequate upright and to prevent falling.   Returned to her room and patient assisted back to bed, positioned with L heel floating. All needs met with alarm on. Patient's Ex-Husband, Fayrene Fearing, arriving with patient approval.    Therapy Documentation Precautions:  Precautions Precautions: Fall Precaution Comments: delayed processing, Increased tone UB and LB Required Braces or Orthoses: Other Brace Other Brace: B adjustable night splints; B elbow "cosey" splints; L palm protector; R mitt Restrictions Weight Bearing Restrictions: No   Therapy/Group: Individual Therapy  Sakia Schrimpf P Herald Vallin  PT, DPT, CSRS  04/06/2023, 7:42 AM

## 2023-04-06 NOTE — Progress Notes (Signed)
PROGRESS NOTE   Subjective/Complaints: Complains of left sided shoulder pain and decreased range of motion Vitals stable No other complains  ROS:  Pt denies SOB, abd pain, CP, N/V/D, and vision changes  +bladder incontinence, +migraines- well controlled, +spasticity- stable, +impaired ambulation, +low back pain- continues, +whole body hypersensitivity, +vaginal itching, +foul smelling urine as per therapy, +heel pain pain improved  Objective:   No results found. Recent Labs    04/04/23 0609  WBC 5.6  HGB 14.1  HCT 42.1  PLT 224       Recent Labs    04/04/23 0609  NA 138  K 3.9  CL 105  CO2 24  GLUCOSE 91  BUN 20  CREATININE 0.69  CALCIUM 9.3        Intake/Output Summary (Last 24 hours) at 04/06/2023 0953 Last data filed at 04/05/2023 1300 Gross per 24 hour  Intake 240 ml  Output --  Net 240 ml       Pressure Injury 02/09/23 Heel Left Stage 3 -  Full thickness tissue loss. Subcutaneous fat may be visible but bone, tendon or muscle are NOT exposed. (Active)  02/09/23 0543  Location: Heel  Location Orientation: Left  Staging: Stage 3 -  Full thickness tissue loss. Subcutaneous fat may be visible but bone, tendon or muscle are NOT exposed.  Wound Description (Comments):   Present on Admission: No    Physical Exam: Vital Signs Blood pressure 101/72, pulse 75, temperature 98.1 F (36.7 C), temperature source Oral, resp. rate 16, height 5\' 1"  (1.549 m), weight 51.8 kg, SpO2 96%.   General: awake, alert, in bed but getting assisted to get up to urinate; NAD HENT: conjugate gaze; oropharynx moist, dons glasses independently CV: regular rate and rhythm; no JVD Pulmonary: CTA B/L; no W/R/R- good air movement GI: soft, NT, ND, (+)BS throughout Psychiatric: engaging, interactive, impulsive at times  Skin: + Scaling dried skin on bilateral heels  - 2 small excoriations, sore on the base of the  5th metatarsal head -no longer present - rash on face - no longer present - stage 2 to right toe -no longer present  + area of redness between left thumb and index finger-now minimal, purple bruise left buttock unstageable - not examined +redness to bilateral elbows -not appreciated + Left heel stage III DTI, covered in foam dressing  Neurologic: AAOx2,  to year but not month or date. Follows most simple commands.  + Hypophonation, speech intelligability much improved MAS 1 right elbow,MAS 1 in RIght finger and wrist flexors  MAS 3 knee flexors, and MAS 3 L>R  hip adductors  MAS 3 L elbow , MAS 3-4 in L wrist and finger flexors  + LUE WHO--- Hypersensitivity to left hand improved, +left hand contracture, Left sided strength improved, decreased range of motion and pain at end range of motion in left shoulder  Ambulated with RW!    Musculoskeletal: reduced bilateral shoulder and elbow ROM due to increased tone , pain with passive knee extension bilaterally   . GU: bladder incontinence, foul smelling urine  Assessment/Plan: 1. Functional deficits which require 3+ hours per day of interdisciplinary therapy in a comprehensive inpatient rehab setting.  Physiatrist is providing close team supervision and 24 hour management of active medical problems listed below. Physiatrist and rehab team continue to assess barriers to discharge/monitor patient progress toward functional and medical goals  Care Tool:  Bathing    Body parts bathed by patient: Face, Abdomen, Chest, Left upper leg, Right upper leg, Left arm, Front perineal area, Right arm, Right lower leg   Body parts bathed by helper: Left lower leg, Buttocks Body parts n/a: Right arm, Left arm, Front perineal area, Buttocks, Right upper leg, Left upper leg, Right lower leg, Left lower leg   Bathing assist Assist Level: Moderate Assistance - Patient 50 - 74%     Upper Body Dressing/Undressing Upper body dressing   What is the patient  wearing?: Pull over shirt    Upper body assist Assist Level: Set up assist    Lower Body Dressing/Undressing Lower body dressing      What is the patient wearing?: Incontinence brief, Pants     Lower body assist Assist for lower body dressing: Maximal Assistance - Patient 25 - 49%     Toileting Toileting Toileting Activity did not occur (Clothing management and hygiene only): N/A (no void or bm)  Toileting assist Assist for toileting: Maximal Assistance - Patient 25 - 49%     Transfers Chair/bed transfer  Transfers assist  Chair/bed transfer activity did not occur: Safety/medical concerns  Chair/bed transfer assist level: Minimal Assistance - Patient > 75%     Locomotion Ambulation   Ambulation assist   Ambulation activity did not occur: Safety/medical concerns  Assist level: Moderate Assistance - Patient 50 - 74% Assistive device: Walker-rolling Max distance: 40ft   Walk 10 feet activity   Assist  Walk 10 feet activity did not occur: Safety/medical concerns  Assist level: Moderate Assistance - Patient - 50 - 74% Assistive device: Walker-rolling   Walk 50 feet activity   Assist Walk 50 feet with 2 turns activity did not occur: Safety/medical concerns         Walk 150 feet activity   Assist Walk 150 feet activity did not occur: Safety/medical concerns         Walk 10 feet on uneven surface  activity   Assist Walk 10 feet on uneven surfaces activity did not occur: Safety/medical concerns         Wheelchair     Assist Is the patient using a wheelchair?: Yes Type of Wheelchair: Manual    Wheelchair assist level: Dependent - Patient 0% Max wheelchair distance: 55ft    Wheelchair 50 feet with 2 turns activity    Assist    Wheelchair 50 feet with 2 turns activity did not occur: Safety/medical concerns   Assist Level: Dependent - Patient 0%   Wheelchair 150 feet activity     Assist  Wheelchair 150 feet activity did  not occur: Safety/medical concerns   Assist Level: Dependent - Patient 0%   Blood pressure 101/72, pulse 75, temperature 98.1 F (36.7 C), temperature source Oral, resp. rate 16, height 5\' 1"  (1.549 m), weight 51.8 kg, SpO2 96%.  Medical Problem List and Plan: 1. 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  Impaired mobility: continue Lovenox 30mg  daily, weekly creatinine ordered             -antiplatelet therapy: N/A  3. Pain:  -Knee pain: Tylenol as needed, continue voltaren gel scheduled. XR reviewed and is stable, discussed with therapy -Right wrist swelling: XR ordered and shows no acute fracture, shows 2.22mm ulnar variance, brace removed to given patient a break from it. Voltaren gel ordered prn for discomfort, continue, CT ordered and show no acute osseus injury, continue ace wrap   -Foot pain: tramadol ordered prn, improved today, continue prn  -Headaches: continue tylenol and tramadol prn -Low back pain: continue kpad, asked [place] to please bring for her -Whole body hypersensitivity: gabapentin 100mg  TID ordered  4. History of anxiety: discussed with her mom that she feels this was a big risk factor for her accident, propanolol d/ced due to hypotension. Continue magnesium supplement  5. Neuropsych/cognition: This patient is not capable of making decisions on her own behalf  - 10/12: Still not capable of medical decision making; BUT oriented and communicating, following directions - MUCH improved -Will add PRN melatonin 3mg  10/20 for insomnia  6. Skin: -Left buttock unstageable  ulcer.  Continue Medihoney to buttock wound daily cover with foam dressing.  Changing dressing every 3 days or as needed soiling -Left hand skin maceration: Eucerin ordered, discussed hand split with OT, continue -Area of redness between left thumb and index finger: continue foam dressing -Scalp eczema: selsun blue ordered, continue -9/7 patient's mother feels like this is causing her to itch, will start some hydrocortisone cream -Small sore on the base of 5th metatarsal head: local wound care, continue -Rash on face: resolved, discussed with mother could have been secondary to added sugars in nutritional supplements -Sacral pressure injury: air mattress ordered, Placed nursing order to offload q1 hour while awake, continue medihoney, wound care consulted, discussed with nursing that this is improving -Blood blister to left heel: continue foam heel protectors.  7. Fluids/Electrolytes/Nutrition: Routine in and outs with follow-up chemistries  -Eating  well with cueing   -Dysphagia: continue D3/thins--tolerating this diet -Low protein stores: d/c prosource since patient hates these and is eating 100% of meals -Suboptimal vitamin D level: increase to 5,000 U daily. continue this dose  -History of magnesium deficiency: magnesium 500mg  started HS  8.  Cavitary right lower lobe pneumonia likely aspiration pneumonia/MRSA pneumonia.  Resolved    Latest Ref Rng & Units 04/04/2023    6:09 AM 02/05/2023   11:12 AM 01/22/2023    6:36 AM  CBC  WBC 4.0 - 10.5 K/uL 5.6  8.5  8.1   Hemoglobin 12.0 - 15.0 g/dL 16.1  09.6  04.5   Hematocrit 36.0 - 46.0 % 42.1  46.7  47.5   Platelets 150 - 400 K/uL 224  284  274      9.  History of drug abuse.  Positive cocaine on urine drug screen.  Provide counseling  10.  AKI/hypovolemia and ATN.  Resolved Appears to have fair fluid intake, creatinine has been stable  11/3- Push fluids due ot low BP  -03/24/23 Cr stable yesterday, BPs stable, monitor    Latest Ref  Rng & Units 04/04/2023    6:09 AM 04/02/2023    3:39 PM 03/30/2023    5:35 AM  BMP  Glucose 70 - 99 mg/dL 91  85    BUN 6 - 20 mg/dL  20  17    Creatinine 0.44 - 1.00 mg/dL 6.94  8.54  6.27   Sodium 135 - 145 mmol/L 138  136    Potassium 3.5 - 5.1 mmol/L 3.9  4.1    Chloride 98 - 111 mmol/L 105  100    CO2 22 - 32 mmol/L 24  26    Calcium 8.9 - 10.3 mg/dL 9.3  8.8      11.  Mild transaminitis with rhabdomyolysis.  Both resolved  12.  Hypotension. Resolved, d/c midodrine -d/c propanolol, decrease baclofen to 10mg  HS>> back up to 20mg  QHS, decrease tizanidine to 2mg  TID 11/2- BP was running 87 systolic last night- and max of 110 in last 24 hours- not dizzy per chart, but if develops any dizziness, suggest primary team restarts Midodrine. Amantadine can cause hypotension, FYI Decrease tizanidine to BID  Vitals:   04/02/23 1550 04/02/23 2040 04/03/23 0432 04/03/23 1501  BP: 95/66 (!) 87/48 (!) 92/59 (!) 94/53   04/03/23 1932 04/04/23 0509 04/04/23 1304 04/04/23 2017  BP: 90/63 97/66 95/75  (!) 91/55   04/05/23 0259 04/05/23 1319 04/05/23 1910 04/06/23 0249  BP: (!) 96/59 101/73 109/75 101/72   13. HTN: see above Increase tizanidine to 6mg  TID--now 4mg  TID>>2mg  TID. Magnesium supplement added HS. Increase propanolol to 20mg  TID>> d/c'd, d/c clonidine patch given hypotension  14. Impaired initiation: continue amantadine 100mg  BID  15. Hyperglycemia: CBGs mildly elevated, d/ced checks. Prosource d/ced as per patient's request  16. Spasticity:  -discussed serial casting with OT and her mom but she defers given pain she had when this was attempted -baclofen and tizanidine decreased due to hypotension, spasticity has been stable. Continue wrist, elbow brace, and PRAFOs. Ordered a night splint for LLE  Repeat CMP today given chronic tizanidine use -9/7 continues to have significant tone despite use of tizanidine and baclofen, may be beneficial consider baclofen pump in outpatient setting  if this causes significant difficulties in care  Decrease tizanidine to BID  17.  Dyskinetic movements in head and neck - appears to be myoclonic jerks related to hypoxic brain injury - as discussed with pt mom, no specific meds for this on antispasticity meds , consioder benzo although with pt's SUD would avoid this class - EEG reviewed and was negative for seizure.  Baclofen decreased to 20mg  HS  18. Cervical extensor weakness: tizanidine decreased to 2mg  BID  19. Bowel and bladder incontinence: continue bowel and bladder program. Conitnue magneisum supplement  20. Fatigue: improved, discussed with team may be secondary to anti-spasticity medications, B12 reviewed and is 335, in suboptimal range, will give 1,000U B12 injection 8/7, will order monthly as needed, metanx started, increase to BID, continue, decrease tizanidine to BID, continue this dose  21. MRSA nares positive: negative on repeat, precautions d/ced  22. Tachycardic: resolved, d/c propanolol.  Continue Tizanidine increased to 6mg  TID>> now down to 2mg  TID. Resolved, amantadine increased back to 100mg  BID, continue this dose, resolved  23. S/p fall: CT head reviewed and is stable  24. Bilateral foot drop: bilateral AFOs ordered, discussed that these can help her to walk better  25. Vaginal itching: clotrimazole ordered, discussed with patient, does not appear to be helping so will switch to fluconazole, resolved  26. Dysuria: UA/UC ordered, discussed negative, resolved  27. Foul smelling urine: asked nursing to encourage 6-8 glasses of water per day, fluconazole started for potential fungal infection given itching as well and negative [place], discussed that this has helped  28. Pain  in left heel: XR obtained and negative, cam boot ordered to help offload heel with therapy, discussed that this has helped  29. Left shoulder pain: XR ordered, voltaren gel ordered    LOS: 139 days A FACE TO FACE EVALUATION WAS  PERFORMED  Nicole [name] Nicole [name] 04/06/2023, 9:53 AM

## 2023-04-06 NOTE — Progress Notes (Addendum)
Patient ID: Nicole [name], female   DOB: Nov 16, 1975, 48 y.o.   MRN: [id number]  Met with pt and mom yesterday to discuss team conference and pt's care. Mom feels there is no way possible she can provide the care pt needs and is aware she could be placed anywhere in the state. She has spoken with [facility] and pt is on the top of the list but still no long term beds. Reached out to [facility] to see what exactly is holding a bed offer back. Spoke with [facility] who reports no word yet on disability approval but have all information and no more needed. She states: " Now it is just a waiting game."  [name] issues are her young age, drug history and medicaid-without disibility check to supplement care.   12:51 PM Spoke with [facility] and [employer] who report [employer] is managing pt's medicaid and without the approved disability they will not cover SNF. This will need to be approved before anyone will offer a bed. They report they will tell you it is something else but that is the reason no bed offers have been given. Will pursue if a LOG will make a difference regarding bed offer with the disability approval being closer than at the beginning of her stay here. Have reached out to management and facilities further to inquire about a bed offer and what it would take to obtain one.

## 2023-04-06 NOTE — Progress Notes (Signed)
Recreational Therapy Session Note  Patient Details  Name: JAKIYAH ROSSER MRN: [id number] Date of Birth: 08/03/1975 Today's Date: 04/06/2023  Pain: no c/o Skilled Therapeutic Interventions/Progress Updates:  Goal:  Pt will maintain selective attention to tabletop card game in a moderately distractive environment for 15 minutes with min cues.  MET  Pt played UNO in the crowded dayroom with mod-max questioning cues for problem solving ~50% of the time and supervision cues ~50%.  Pt appreciative of this session.  Offered playing music in the room at end of the session & pt requesting Claretha Cooper.  Pt left seated in w/c, alarm activated and needs within reach.   Bronte Sabado 04/06/2023, 12:24 PM

## 2023-04-06 NOTE — Progress Notes (Signed)
Occupational Therapy Weekly Progress Note  Patient Details  Name: SENNA PECH MRN: [id number] Date of Birth: Oct 08, 1975  Beginning of progress report period: March 29, 2023 End of progress report period: April 06, 2023  Today's Date: 04/06/2023 OT Individual Time: 0900-1005 OT Individual Time Calculation (min): 65 min    Patient has met 3 of 4 short term goals.  Patient   Patient continues to demonstrate the following deficits: muscle weakness and muscle joint tightness, impaired timing and sequencing, abnormal tone, unbalanced muscle activation, and decreased coordination, decreased visual perceptual skills and decreased visual motor skills, decreased midline orientation and decreased attention to left, decreased attention, decreased awareness, decreased problem solving, decreased safety awareness, and decreased memory, and decreased sitting balance, decreased standing balance, decreased postural control, hemiplegia, and decreased balance strategies and therefore will continue to benefit from skilled OT intervention to enhance overall performance with BADL and Reduce care partner burden.  Patient progressing toward long term goals..  Continue plan of care.  OT Short Term Goals Week 19:  OT Short Term Goal 1 (Week 1): Week 19:  Patient will stand at sink with min assist long enough for helper to pull up pants OT Short Term Goal 2 (Week 1): Week 19:  Patient will choose clothing items from drawer without cueing once placed in front of dresser OT Short Term Goal 3 (Week 1): Week 19:  Patient will don right sock on foot with min assist for trunk control OT Short Term Goal 4 (Week 1): Week 19:  Patient will utilize external cues to orient herself to time/ date  Skilled Therapeutic Interventions/Progress Updates:    Patient received supine in bed, agreeable to OT session.  Patient requesting to shower.  Patient starting to show improved initiation, however lacks full awareness of deficits.   Patient continues to have some perseverative behaviors.  (Calling her mom and asking for a cell phone.) Patient showing improved ability to pull to stand in shower, but has limited stand tolerance.  Patient has improved static sitting balance as related to BADL, however, needs facilitation and physical support as she tends to drift both forward and left and her balance responses are significantly delayed.  Patient continues to make steady progress in all aspects of ADL, and participation in familiar tasks improves weekly.  Patient able to put pants on over right leg this am without assistance.  Still needing help to position left leg in a manner she can manipulate clothing.   Patient left up in wheelchair at end of session with safety belt in place and engaged and call bell in reach.    Therapy Documentation Precautions:  Precautions Precautions: Fall Precaution Comments: delayed processing, Increased tone UB and LB Required Braces or Orthoses: Other Brace Other Brace: B adjustable night splints; B elbow "cosey" splints; L palm protector; R mitt Restrictions Weight Bearing Restrictions: No   Pain: Pain Assessment Pain Scale: 0-10 Pain Score: 4 left shoulder and left heel     Therapy/Group: Individual Therapy  [name] 04/06/2023, 12:31 PM

## 2023-04-07 DIAGNOSIS — I959 Hypotension, unspecified: Secondary | ICD-10-CM

## 2023-04-07 NOTE — Progress Notes (Signed)
PROGRESS NOTE   Subjective/Complaints:  Pt doing well, slept well, R shoulder pain improving, pain well controlled overall, LBM today, urination fine per pt. Denies any other complaints or concerns today.   ROS:  Pt denies SOB, abd pain, CP, N/V/D, and vision changes  +bladder incontinence, +migraines- well controlled, +spasticity- stable, +impaired ambulation, +low back pain- continues, +whole body hypersensitivity, +vaginal itching, +foul smelling urine as per therapy, +heel pain pain improved  Objective:   DG Shoulder Right  Result Date: 04/06/2023 CLINICAL DATA:  Pain. EXAM: RIGHT SHOULDER - 2+ VIEW COMPARISON:  None Available. FINDINGS: There is no evidence of fracture or dislocation. Possible os acromial. There is no evidence of arthropathy or other focal bone abnormality. Soft tissues are unremarkable. IMPRESSION: Possible os acromial. Otherwise unremarkable radiographic appearance of the shoulder. Electronically Signed   By: Narda Rutherford M.D.   On: 04/06/2023 16:55   No results for input(s): "WBC", "HGB", "HCT", "PLT" in the last 72 hours.      No results for input(s): "NA", "K", "CL", "CO2", "GLUCOSE", "BUN", "CREATININE", "CALCIUM" in the last 72 hours.       Intake/Output Summary (Last 24 hours) at 04/07/2023 1145 Last data filed at 04/07/2023 0756 Gross per 24 hour  Intake 600 ml  Output --  Net 600 ml       Pressure Injury 02/09/23 Heel Left Stage 3 -  Full thickness tissue loss. Subcutaneous fat may be visible but bone, tendon or muscle are NOT exposed. (Active)  02/09/23 0543  Location: Heel  Location Orientation: Left  Staging: Stage 3 -  Full thickness tissue loss. Subcutaneous fat may be visible but bone, tendon or muscle are NOT exposed.  Wound Description (Comments):   Present on Admission: No    Physical Exam: Vital Signs Blood pressure 99/65, pulse 65, temperature 98.3 F (36.8 C),  temperature source Oral, resp. rate 17, height 5\' 1"  (1.549 m), weight 51.8 kg, SpO2 98%.   General: awake, alert, in bed resting; NAD HENT: conjugate gaze; oropharynx moist, dons glasses independently CV: regular rate and rhythm; no JVD Pulmonary: CTA B/L; no W/R/R- good air movement GI: soft, NT, ND, (+)BS throughout Psychiatric: engaging, interactive, impulsive at times  PRIOR EXAMS: Skin: + Scaling dried skin on bilateral heels  - 2 small excoriations, sore on the base of the 5th metatarsal head -no longer present - rash on face - no longer present - stage 2 to right toe -no longer present  + area of redness between left thumb and index finger-now minimal, purple bruise left buttock unstageable - not examined +redness to bilateral elbows -not appreciated + Left heel stage III DTI, covered in foam dressing  Neurologic: AAOx2,  to year but not month or date. Follows most simple commands.  + Hypophonation, speech intelligability much improved MAS 1 right elbow,MAS 1 in RIght finger and wrist flexors  MAS 3 knee flexors, and MAS 3 L>R  hip adductors  MAS 3 L elbow , MAS 3-4 in L wrist and finger flexors  + LUE WHO--- Hypersensitivity to left hand improved, +left hand contracture, Left sided strength improved, decreased range of motion and pain at end range of motion  in left shoulder  Ambulated with RW!    Musculoskeletal: reduced bilateral shoulder and elbow ROM due to increased tone , pain with passive knee extension bilaterally   . GU: bladder incontinence, foul smelling urine  Assessment/Plan: 1. Functional deficits which require 3+ hours per day of interdisciplinary therapy in a comprehensive inpatient rehab setting. Physiatrist is providing close team supervision and 24 hour management of active medical problems listed below. Physiatrist and rehab team continue to assess barriers to discharge/monitor patient progress toward functional and medical goals  Care  Tool:  Bathing    Body parts bathed by patient: Face, Abdomen, Chest, Left upper leg, Right upper leg, Left arm, Front perineal area, Right arm, Right lower leg   Body parts bathed by helper: Buttocks, Left lower leg Body parts n/a: Right arm, Left arm, Front perineal area, Buttocks, Right upper leg, Left upper leg, Right lower leg, Left lower leg   Bathing assist Assist Level: Minimal Assistance - Patient > 75%     Upper Body Dressing/Undressing Upper body dressing   What is the patient wearing?: Pull over shirt    Upper body assist Assist Level: Set up assist    Lower Body Dressing/Undressing Lower body dressing      What is the patient wearing?: Incontinence brief, Pants     Lower body assist Assist for lower body dressing: Moderate Assistance - Patient 50 - 74%     Toileting Toileting Toileting Activity did not occur Press photographer and hygiene only): N/A (no void or bm)  Toileting assist Assist for toileting: Maximal Assistance - Patient 25 - 49%     Transfers Chair/bed transfer  Transfers assist  Chair/bed transfer activity did not occur: Safety/medical concerns  Chair/bed transfer assist level: Minimal Assistance - Patient > 75%     Locomotion Ambulation   Ambulation assist   Ambulation activity did not occur: Safety/medical concerns  Assist level: Moderate Assistance - Patient 50 - 74% Assistive device: Walker-rolling Max distance: 54ft   Walk 10 feet activity   Assist  Walk 10 feet activity did not occur: Safety/medical concerns  Assist level: Moderate Assistance - Patient - 50 - 74% Assistive device: Walker-rolling   Walk 50 feet activity   Assist Walk 50 feet with 2 turns activity did not occur: Safety/medical concerns         Walk 150 feet activity   Assist Walk 150 feet activity did not occur: Safety/medical concerns         Walk 10 feet on uneven surface  activity   Assist Walk 10 feet on uneven surfaces activity  did not occur: Safety/medical concerns         Wheelchair     Assist Is the patient using a wheelchair?: Yes Type of Wheelchair: Manual    Wheelchair assist level: Dependent - Patient 0% Max wheelchair distance: 7ft    Wheelchair 50 feet with 2 turns activity    Assist    Wheelchair 50 feet with 2 turns activity did not occur: Safety/medical concerns   Assist Level: Dependent - Patient 0%   Wheelchair 150 feet activity     Assist  Wheelchair 150 feet activity did not occur: Safety/medical concerns   Assist Level: Dependent - Patient 0%   Blood pressure 99/65, pulse 65, temperature 98.3 F (36.8 C), temperature source Oral, resp. rate 17, height 5\' 1"  (1.549 m), weight 51.8 kg, SpO2 98%.  Medical Problem List and Plan: 1. 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  Impaired mobility: continue Lovenox 30mg  daily, weekly creatinine ordered             -antiplatelet therapy: N/A  3. Pain:  -Knee pain: Tylenol as needed, continue voltaren gel scheduled. XR reviewed and is stable, discussed with therapy -Right wrist swelling: XR ordered and shows no acute fracture, shows 2.33mm ulnar variance, brace removed to given patient a break from it. Voltaren gel ordered prn for discomfort, continue, CT ordered and show no acute osseus injury, continue ace wrap   -Foot pain: tramadol ordered prn, improved today, continue prn  -Headaches: continue tylenol and tramadol prn -Low back pain: continue kpad, asked [place] to please bring for her -Whole body hypersensitivity: gabapentin  100mg  TID ordered -Pain in left heel: XR obtained and negative, cam boot ordered to help offload heel with therapy, discussed that this has helped -R shoulder pain: XR neg for acute etiology on 04/06/23, voltaren gel ordered  4. History of anxiety: discussed with her mom that she feels this was a big risk factor for her accident, propanolol d/ced due to hypotension. Continue magnesium supplement  5. Neuropsych/cognition: This patient is not capable of making decisions on her own behalf  - 10/12: Still not capable of medical decision making; BUT oriented and communicating, following directions - MUCH improved -Will add PRN melatonin 3mg  10/20 for insomnia  6. Skin: -Left buttock unstageable ulcer.  Continue Medihoney to buttock wound daily cover with foam dressing.  Changing dressing every 3 days or as needed soiling -Left hand skin maceration: Eucerin ordered, discussed hand split with OT, continue -Area of redness between left thumb and index finger: continue foam dressing -Scalp eczema: selsun blue ordered, continue -9/7 patient's mother feels like this is causing her to itch, will start some hydrocortisone cream -Small sore on the base of 5th metatarsal head: local wound care, continue -Rash on face: resolved, discussed with mother could have been secondary to added sugars in nutritional supplements -Sacral pressure injury: air mattress ordered, Placed nursing order to offload q1 hour while awake, continue medihoney, wound care consulted, discussed with nursing that this is improving -Blood blister to left heel: continue foam heel protectors.  7. Fluids/Electrolytes/Nutrition: Routine in and outs with follow-up chemistries  -Eating  well with cueing   -Dysphagia: continue D3/thins--tolerating this diet -Low protein stores: d/c prosource since patient hates these and is eating 100% of meals -Suboptimal vitamin D level: increase to 5,000 U daily. continue this dose  -History of magnesium  deficiency: magnesium 500mg  started HS  8.  Cavitary right lower lobe pneumonia likely aspiration pneumonia/MRSA pneumonia.  Resolved    Latest Ref Rng & Units 04/04/2023    6:09 AM 02/05/2023   11:12 AM 01/22/2023    6:36 AM  CBC  WBC 4.0 - 10.5 K/uL 5.6  8.5  8.1   Hemoglobin 12.0 - 15.0 g/dL 16.1  09.6  04.5   Hematocrit 36.0 - 46.0 % 42.1  46.7  47.5   Platelets 150 - 400 K/uL 224  284  274      9.  History of drug abuse.  Positive cocaine on urine drug screen.  Provide counseling  10.  AKI/hypovolemia and ATN.  Resolved Appears to have fair fluid intake, creatinine has been stable  11/3- Push fluids due ot low BP  -04/07/23 Cr stable 04/04/23, BPs soft but stable, monitor    Latest Ref Rng & Units 04/04/2023    6:09 AM 04/02/2023    3:39 PM 03/30/2023    5:35 AM  BMP  Glucose 70 - 99 mg/dL 91  85    BUN 6 - 20 mg/dL 20  17    Creatinine 4.78 - 1.00 mg/dL 2.95  6.21  3.08   Sodium 135 - 145 mmol/L 138  136    Potassium 3.5 - 5.1 mmol/L 3.9  4.1    Chloride 98 - 111 mmol/L 105  100    CO2 22 - 32 mmol/L 24  26    Calcium 8.9 - 10.3 mg/dL 9.3  8.8      11.  Mild transaminitis with rhabdomyolysis.  Both resolved  12.  Hypotension. Resolved, d/c midodrine -d/c propanolol, decrease baclofen to 10mg  HS>> back up to 20mg  QHS, decrease tizanidine to 2mg  TID 11/2- BP was running 87 systolic last night- and max of 110 in last 24 hours- not dizzy per chart, but if develops any dizziness, suggest primary team restarts Midodrine. Amantadine can cause hypotension, FYI Decrease tizanidine to BID  -04/07/23 BPs soft but stable, pt asymptomatic, monitor   Vitals:   04/03/23 1501 04/03/23 1932 04/04/23 0509 04/04/23 1304  BP: (!) 94/53 90/63 97/66  95/75   04/04/23 2017 04/05/23 0259 04/05/23 1319 04/05/23 1910  BP: (!) 91/55 (!) 96/59 101/73 109/75   04/06/23 0249 04/06/23 1308 04/06/23 2046 04/07/23 0603  BP: 101/72 95/62 96/68  99/65   13. HTN: see above Increase tizanidine to  6mg  TID--now 4mg  TID>>2mg  TID. Magnesium supplement added HS. Increase propanolol to 20mg  TID>> d/c'd, d/c clonidine patch given hypotension  14. Impaired initiation: continue amantadine 100mg  BID  15. Hyperglycemia: CBGs mildly elevated, d/ced checks. Prosource d/ced as per patient's request  16. Spasticity:  -discussed serial casting with OT and her mom but she defers given pain she had when this was attempted -baclofen and tizanidine decreased due to hypotension, spasticity has been stable. Continue wrist, elbow brace, and PRAFOs. Ordered a night splint for LLE  Repeat CMP today given chronic tizanidine use -9/7 continues to have significant tone despite use of tizanidine and baclofen, may be beneficial consider baclofen pump in outpatient setting if this causes significant difficulties in care  Decrease tizanidine to BID  17.  Dyskinetic movements in head and neck - appears to be myoclonic jerks related to hypoxic brain injury - as discussed with pt mom, no specific meds for this on antispasticity meds , consioder benzo although with pt's SUD would avoid this class - EEG reviewed and was negative for seizure.  Baclofen decreased to 20mg  HS  18. Cervical extensor weakness: tizanidine decreased to 2mg  BID  19. Bowel and bladder incontinence: continue bowel and bladder program. Conitnue magneisum supplement  20. Fatigue: improved, discussed with team may be secondary to anti-spasticity medications, B12 reviewed and is 335, in suboptimal range, will give 1,000U B12 injection 8/7, will order monthly as needed, metanx started, increase to BID, continue, decrease tizanidine to BID, continue this dose  21. MRSA nares positive: negative on repeat, precautions d/ced  22. Tachycardic: resolved, d/c propanolol.  Continue Tizanidine increased to 6mg  TID>> now down to 2mg  TID.  Resolved, amantadine increased back to 100mg  BID, continue this dose, resolved  23. S/p fall: CT head reviewed and is  stable  24. Bilateral foot drop: bilateral AFOs ordered, discussed that these can help her to walk better  25. Vaginal itching: clotrimazole ordered, discussed with patient, does not appear to be helping so will switch to fluconazole, resolved  26. Dysuria: UA/UC ordered, discussed negative, resolved  27. Foul smelling urine: asked nursing to encourage 6-8 glasses of water per day, fluconazole started for potential fungal infection given itching as well and negative UA, discussed that this has helped    LOS: 140 days A FACE TO FACE EVALUATION WAS PERFORMED  [address] Daylin Gruszka 04/07/2023, 11:45 AM

## 2023-04-08 NOTE — Progress Notes (Signed)
Speech Language Pathology Daily Session Note  Patient Details  Name: ALLEX KASSON MRN: [id number] Date of Birth: [birth year]  Today's Date: 04/08/2023 SLP Individual Time: 4098-1191 SLP Individual Time Calculation (min): 47 min  Short Term Goals: Week 9: SLP Short Term Goal 1 (Week 9): Week 19 - Pt will solve basic to mildly complex problems with 90% accuracy given min assist. SLP Short Term Goal 2 (Week 9): Week 19 -Patient will utilize external aids for orientation to place and time with supervision level verbal cues SLP Short Term Goal 3 (Week 9): Week 19 - Pt will demonstrate sustained attention to basic tasks for 15 minutes with Min A multimodal cues for redirection. SLP Short Term Goal 4 (Week 9): Week 19 - Pt will utilize increased vocal intensity w/ supervision verbal cues to remain 90% intelligibile at sentence level  Skilled Therapeutic Interventions: Pt seen for skilled ST with focus on cognitive communication goals, pt in bed with NT providing AM assistance and meal setup. With extra time and Supervision A cues, pt able to recall upcoming holiday and various hospital events/timelines. SLP facilitating ongoing practice with handwriting, targeting high frequency items such as name, signature, birthdate, etc. Pt benefits from min-mod A cues to reduce impulsivity with writing tasks and generally slow rate to improve legibility. Pt demonstrates appropriate sustained attention during structured and non-structured tasks for >30 minutes. SLP facilitating basic problem solving tasks with focus on immediate environment by providing min-mod A for 80% accuracy. Pt left in bed with alarm set and music per preference playing on computer, cont ST POC.  Pain Pain Assessment Pain Scale: 0-10 Pain Score: 0-No pain  Therapy/Group: Individual Therapy  Tacey Ruiz 04/08/2023, 9:30 AM

## 2023-04-08 NOTE — Progress Notes (Signed)
Occupational Therapy Session Note  Patient Details  Name: Nicole [name] MRN: [id number] Date of Birth: [birth year]  Today's Date: 04/08/2023 OT Individual Time: 1000-1100 OT Individual Time Calculation (min): 60 min    Short Term Goals: Week 9: OT Short Term Goal 1 (Week 1): Week 19:  Patient will stand at sink with min assist long enough for helper to pull up pants OT Short Term Goal 1 - Progress (Week 1): Progressing toward goal OT Short Term Goal 2 (Week 1): Week 19:  Patient will choose clothing items from drawer without cueing once placed in front of dresser OT Short Term Goal 2 - Progress (Week 1): Met OT Short Term Goal 3 (Week 1): Week 19:  Patient will don right sock on foot with min assist for trunk control OT Short Term Goal 3 - Progress (Week 1): Progressing toward goal OT Short Term Goal 4 (Week 1): Week 19:  Patient will utilize external cues to orient herself to time/ date  Skilled Therapeutic Interventions/Progress Updates:    1:! Pt received in w/c. Positioned pt with her left side at the dresser. Pt was able to open and pick out her clothing with her left UE with questioning cues for the correct pieces (instead of initially 2 shirts). Padded tub bench adjusted to lowest height for showering and pt transfers stand step pivot into the shower with the grab bar with min A with instructional cues. Pt did required max cues throughout shower to maintain sitting balance with lifting up her head and maintaining posture at midline. Did need to reposition a few times with min A. Pt able to bathe with min A. Pt also tolerated the seat long enough to help shave under arms and legs. Pt transferred out of shower also with min A. After donning shirt with contact guard for assisting with head positioning for optimal success; pt empty her entire bladder! Pt reported she had to pee and the minute she said that it flowed. Total A to re wash LB; however pt was able to stand at the sink with increased  upright posture for at least 2 min for OT to assist with hygiene and w/c. Pt able to thread bilateral feet into pants but required A to adjust pants for her to continue to pull her feet through. Transferred back to the bed with min A with cues for hand placement. Left resting in bed.   Therapy Documentation Precautions:  Precautions Precautions: Fall Precaution Comments: delayed processing, Increased tone UB and LB Required Braces or Orthoses: Other Brace Other Brace: B adjustable night splints; B elbow "cosey" splints; L palm protector; R mitt Restrictions Weight Bearing Restrictions: No  Pain: Pain Assessment Pain Scale: 0-10 Pain Score: 0-No pain    Therapy/Group: Individual Therapy  Roney Mans [facility] 04/08/2023, 12:34 PM

## 2023-04-08 NOTE — Progress Notes (Signed)
Physical Therapy Session Note  Patient Details  Name: Nicole [name] MRN: [id number] Date of Birth: [birth year]  Today's Date: 04/08/2023 PT Individual Time: 0900-0957 PT Individual Time Calculation (min): 57 min   Short Term Goals: Week 1:  PT Short Term Goal 1 (Week 1): Pt will ambulate 68ft with modA and LRAD PT Short Term Goal 1 - Progress (Week 1): Not met PT Short Term Goal 2 (Week 1): Pt will complete functional outcome measure to assess balance and falls risk PT Short Term Goal 2 - Progress (Week 1): Not met PT Short Term Goal 3 (Week 1): Week 13: Pt will complete bed<>chair transfers with totalA (pt <25%) PT Short Term Goal 3 - Progress (Week 1): Not met  Skilled Therapeutic Interventions/Progress Updates:      Pt in bed to start - agreeable to therapy treatment. Reports she's trying to get the "[employer]" phone number to call an associate to try and set herself up for guitar lessons.   Pt also reports a "small inconvenient" as she's incontinent of large BM. TotalA for brief change and pericare at bed level.   Donned pants at bed level with patient able to fully bridge and clear hips to pull up. Donned tennis shoe and CAM boot to her LLE. Supine<>Sitting EOB with minA but has frequent LOB posteriorly as she sits. Completed UB dressing with modA and minA for sitting balance. Stand pivot transfer with min/modA into w/c with cues for stepping and shifting hips.   Transported to main rehab gym for time management.   Assisted to mat table in similar manner. Remainder of session focused on stretching her legs. Assisted into long sitting position with bolster b/w legs and PT providing back support. Patient with a rigid posterior tilt that is difficult to correct externally. Pt then positioned into butterfly position and worked on groin stretching with external assist. Then spent time lying flat with feet, legs, back, and neck straight into extension.   Returned to sitting  EOM with minA and assisted to her w/c. Returned to her room and left sitting upright in w/c with safety belt alarm on. All needs met.  *pt inquiring on if she can no longer have the [employer] - team notified via secure chat, feel this is appropriate as she's shown no signs of impulsivity or attempts to mobilize without staff assist.   Therapy Documentation Precautions:  Precautions Precautions: Fall Precaution Comments: delayed processing, Increased tone UB and LB Required Braces or Orthoses: Other Brace Other Brace: B adjustable night splints; B elbow "cosey" splints; L palm protector; R mitt Restrictions Weight Bearing Restrictions: No General:     Therapy/Group: Individual Therapy  Orrin Brigham 04/08/2023, 7:39 AM

## 2023-04-08 NOTE — Progress Notes (Signed)
Occupational Therapy Session Note  Patient Details  Name: Nicole [name] MRN: [id number] Date of Birth: April 01, 1976  Today's Date: 04/08/2023 OT Individual Time: 1351-1441 OT Individual Time Calculation (min): 50 min    Short Term Goals: Week 9: OT Short Term Goal 1 (Week 9): week 18: Pt will transfer to toilet with mod A with RW OT Short Term Goal 1 - Progress (Week 9): Met OT Short Term Goal 2 (Week 9): Week 18: Pt will be able to come to EOB in prep for ADLs with min A OT Short Term Goal 2 - Progress (Week 9): Progressing toward goal OT Short Term Goal 3 (Week 9): Pt will feed self 50% of meal with min A OT Short Term Goal 3 - Progress (Week 9): Met OT Short Term Goal 4 (Week 9): Week 18: Pt will use external aids to be oriented x3 with mod A OT Short Term Goal 4 - Progress (Week 9): Not met  Skilled Therapeutic Interventions/Progress Updates:  Pt greeted resting in bed for skilled OT session with focus on functional transfers and cognition for carryover in ADL participation.   Pain: Pt with un-rated pain in LLE, OT offering intermediate rest breaks and positioning suggestions throughout session to address pain/fatigue and maximize participation/safety in session.   Functional Transfers: Pt performs stand-step transfer from EOB>WC with Mod A + cuing for proximity of RW. Stand-pivot from WC>EOB with use of bed-rail, Min A but patient sitting prematurely with L-thigh landing onto [facility] wheel, x2 scoots to reach comfortable position on EOB. Sit>supine with cuing + Mod A to prevent extension backwards onto bed.   Self Care Tasks: Pt declines all ADL needs this session.   Therapeutic Activities: In day room, pt tasked with mirroring simple design onto sliding peg-board, targeting selective attention in moderately distracting environment. Pt replicates design with Max level of multimodal cuing, initially prompted with questioning cues transitioning to instructional and demonstrative to reach  successful completion of activity. Pt unable to recognize she made the same error multiple times, placing a fourth piece in column blocking movement of subsequent pieces.   Session transitioned to meal prep activity with use of printed grocery items and recipes. Pt able to follow x2 recipes with Mod questioning cues, locating correct grocery items in large spread of images including 3-5 distractor items.   Pt remained resting in bed with 4Ps assessed and immediate needs met. Pt continues to be appropriate for skilled OT intervention to promote further functional independence in ADLs/IADLs.   Therapy Documentation Precautions:  Precautions Precautions: Fall Precaution Comments: delayed processing, Increased tone UB and LB Required Braces or Orthoses: Other Brace Other Brace: B adjustable night splints; B elbow "cosey" splints; L palm protector; R mitt Restrictions Weight Bearing Restrictions: No  Therapy/Group: Individual Therapy  Lou Cal, OTR/L, MSOT  04/08/2023, 5:22 AM

## 2023-04-08 NOTE — Progress Notes (Signed)
PROGRESS NOTE   Subjective/Complaints:  Pt doing well this morning, slept well, denies pain today, LBM today, urination fine per pt. Denies any other complaints or concerns today. [facility] PT inquiring if we can d/c telesitter.   ROS:  Pt denies SOB, abd pain, CP, N/V/D, and vision changes  +bladder incontinence, +migraines- well controlled, +spasticity- stable, +impaired ambulation, +low back pain- continues, +whole body hypersensitivity, +vaginal itching, +foul smelling urine as per therapy, +heel pain pain improved  Objective:   DG Shoulder Right  Result Date: 04/06/2023 CLINICAL DATA:  Pain. EXAM: RIGHT SHOULDER - 2+ VIEW COMPARISON:  None Available. FINDINGS: There is no evidence of fracture or dislocation. Possible os acromial. There is no evidence of arthropathy or other focal bone abnormality. Soft tissues are unremarkable. IMPRESSION: Possible os acromial. Otherwise unremarkable radiographic appearance of the shoulder. Electronically Signed   By: Narda Rutherford M.D.   On: 04/06/2023 16:55   No results for input(s): "WBC", "HGB", "HCT", "PLT" in the last 72 hours.      No results for input(s): "NA", "K", "CL", "CO2", "GLUCOSE", "BUN", "CREATININE", "CALCIUM" in the last 72 hours.       Intake/Output Summary (Last 24 hours) at 04/08/2023 1110 Last data filed at 04/08/2023 0800 Gross per 24 hour  Intake 1070 ml  Output --  Net 1070 ml       Pressure Injury 02/09/23 Heel Left Stage 3 -  Full thickness tissue loss. Subcutaneous fat may be visible but bone, tendon or muscle are NOT exposed. (Active)  02/09/23 0543  Location: Heel  Location Orientation: Left  Staging: Stage 3 -  Full thickness tissue loss. Subcutaneous fat may be visible but bone, tendon or muscle are NOT exposed.  Wound Description (Comments):   Present on Admission: No    Physical Exam: Vital Signs Blood pressure 98/70, pulse 71,  temperature 98 F (36.7 C), resp. rate 17, height 5\' 1"  (1.549 m), weight 51.8 kg, SpO2 97%.   General: awake, alert, in bed resting; NAD HENT: conjugate gaze; oropharynx moist, dons glasses independently CV: regular rate and rhythm; no JVD Pulmonary: CTA B/L; no W/R/R- good air movement GI: soft, NT, ND, (+)BS throughout Psychiatric: engaging, interactive, impulsive at times-- less impulsive recently  PRIOR EXAMS: Skin: + Scaling dried skin on bilateral heels  - 2 small excoriations, sore on the base of the 5th metatarsal head -no longer present - rash on face - no longer present - stage 2 to right toe -no longer present  + area of redness between left thumb and index finger-now minimal, purple bruise left buttock unstageable - not examined +redness to bilateral elbows -not appreciated + Left heel stage III DTI, covered in foam dressing  Neurologic: AAOx2,  to year but not month or date. Follows most simple commands.  + Hypophonation, speech intelligability much improved MAS 1 right elbow,MAS 1 in RIght finger and wrist flexors  MAS 3 knee flexors, and MAS 3 L>R  hip adductors  MAS 3 L elbow , MAS 3-4 in L wrist and finger flexors  + LUE WHO--- Hypersensitivity to left hand improved, +left hand contracture, Left sided strength improved, decreased range of motion and pain  at end range of motion in left shoulder  Ambulated with RW!    Musculoskeletal: reduced bilateral shoulder and elbow ROM due to increased tone , pain with passive knee extension bilaterally   . GU: bladder incontinence, foul smelling urine  Assessment/Plan: 1. Functional deficits which require 3+ hours per day of interdisciplinary therapy in a comprehensive inpatient rehab setting. Physiatrist is providing close team supervision and 24 hour management of active medical problems listed below. Physiatrist and rehab team continue to assess barriers to discharge/monitor patient progress toward functional and  medical goals  Care Tool:  Bathing    Body parts bathed by patient: Face, Abdomen, Chest, Left upper leg, Right upper leg, Left arm, Front perineal area, Right arm, Right lower leg   Body parts bathed by helper: Buttocks, Left lower leg Body parts n/a: Right arm, Left arm, Front perineal area, Buttocks, Right upper leg, Left upper leg, Right lower leg, Left lower leg   Bathing assist Assist Level: Minimal Assistance - Patient > 75%     Upper Body Dressing/Undressing Upper body dressing   What is the patient wearing?: Pull over shirt    Upper body assist Assist Level: Set up assist    Lower Body Dressing/Undressing Lower body dressing      What is the patient wearing?: Incontinence brief, Pants     Lower body assist Assist for lower body dressing: Moderate Assistance - Patient 50 - 74%     Toileting Toileting Toileting Activity did not occur Press photographer and hygiene only): N/A (no void or bm)  Toileting assist Assist for toileting: Maximal Assistance - Patient 25 - 49%     Transfers Chair/bed transfer  Transfers assist  Chair/bed transfer activity did not occur: Safety/medical concerns  Chair/bed transfer assist level: Minimal Assistance - Patient > 75%     Locomotion Ambulation   Ambulation assist   Ambulation activity did not occur: Safety/medical concerns  Assist level: Moderate Assistance - Patient 50 - 74% Assistive device: Walker-rolling Max distance: 92ft   Walk 10 feet activity   Assist  Walk 10 feet activity did not occur: Safety/medical concerns  Assist level: Moderate Assistance - Patient - 50 - 74% Assistive device: Walker-rolling   Walk 50 feet activity   Assist Walk 50 feet with 2 turns activity did not occur: Safety/medical concerns         Walk 150 feet activity   Assist Walk 150 feet activity did not occur: Safety/medical concerns         Walk 10 feet on uneven surface  activity   Assist Walk 10 feet on  uneven surfaces activity did not occur: Safety/medical concerns         Wheelchair     Assist Is the patient using a wheelchair?: Yes Type of Wheelchair: Manual    Wheelchair assist level: Dependent - Patient 0% Max wheelchair distance: 9ft    Wheelchair 50 feet with 2 turns activity    Assist    Wheelchair 50 feet with 2 turns activity did not occur: Safety/medical concerns   Assist Level: Dependent - Patient 0%   Wheelchair 150 feet activity     Assist  Wheelchair 150 feet activity did not occur: Safety/medical concerns   Assist Level: Dependent - Patient 0%   Blood pressure 98/70, pulse 71, temperature 98 F (36.7 C), resp. rate 17, height 5\' 1"  (1.549 m), weight 51.8 kg, SpO2 97%.  Medical Problem List and Plan: 1. 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  Impaired mobility: continue Lovenox 30mg  daily, weekly creatinine ordered             -antiplatelet therapy: N/A  3. Pain:  -Knee pain: Tylenol as needed, continue voltaren gel scheduled. XR reviewed and is stable, discussed with therapy -Right wrist swelling: XR ordered and shows no acute fracture, shows 2.73mm ulnar variance, brace removed to given patient a break from it. Voltaren gel ordered prn for discomfort, continue, CT ordered and show no acute osseus injury, continue ace wrap   -Foot pain: tramadol ordered prn, improved today, continue prn  -Headaches: continue tylenol and tramadol prn -Low back pain: continue  kpad, asked [place] to please bring for her -Whole body hypersensitivity: gabapentin 100mg  TID ordered -Pain in left heel: XR obtained and negative, cam boot ordered to help offload heel with therapy, discussed that this has helped -R shoulder pain: XR neg for acute etiology on 04/06/23, voltaren gel ordered--improved 11/24  4. History of anxiety: discussed with her mom that she feels this was a big risk factor for her accident, propanolol d/ced due to hypotension. Continue magnesium supplement  5. Neuropsych/cognition: This patient is not capable of making decisions on her own behalf  - 10/12: Still not capable of medical decision making; BUT oriented and communicating, following directions - MUCH improved -Will add PRN melatonin 3mg  10/20 for insomnia  6. Skin: -Left buttock unstageable ulcer.  Continue Medihoney to buttock wound daily cover with foam dressing.  Changing dressing every 3 days or as needed soiling -Left hand skin maceration: Eucerin ordered, discussed hand split with OT, continue -Area of redness between left thumb and index finger: continue foam dressing -Scalp eczema: selsun blue ordered, continue -9/7 patient's mother feels like this is causing her to itch, will start some hydrocortisone cream -Small sore on the base of 5th metatarsal head: local wound care, continue -Rash on face: resolved, discussed with mother could have been secondary to added sugars in nutritional supplements -Sacral pressure injury: air mattress ordered, Placed nursing order to offload q1 hour while awake, continue medihoney, wound care consulted, discussed with nursing that this is improving -Blood blister to left heel: continue foam heel protectors.  7. Fluids/Electrolytes/Nutrition: Routine in and outs with follow-up chemistries  -Eating  well with cueing   -Dysphagia: continue D3/thins--tolerating this diet -Low protein stores: d/c prosource since patient hates these and is eating 100% of  meals -Suboptimal vitamin D level: increase to 5,000 U daily. continue this dose  -History of magnesium deficiency: magnesium 500mg  started HS  8.  Cavitary right lower lobe pneumonia likely aspiration pneumonia/MRSA pneumonia.  Resolved    Latest Ref Rng & Units 04/04/2023    6:09 AM 02/05/2023   11:12 AM 01/22/2023    6:36 AM  CBC  WBC 4.0 - 10.5 K/uL 5.6  8.5  8.1   Hemoglobin 12.0 - 15.0 g/dL 16.1  09.6  04.5   Hematocrit 36.0 - 46.0 % 42.1  46.7  47.5  Platelets 150 - 400 K/uL 224  284  274      9.  History of drug abuse.  Positive cocaine on urine drug screen.  Provide counseling  10.  AKI/hypovolemia and ATN.  Resolved Appears to have fair fluid intake, creatinine has been stable  11/3- Push fluids due ot low BP  -04/07/23 Cr stable 04/04/23, BPs soft but stable, monitor    Latest Ref Rng & Units 04/04/2023    6:09 AM 04/02/2023    3:39 PM 03/30/2023    5:35 AM  BMP  Glucose 70 - 99 mg/dL 91  85    BUN 6 - 20 mg/dL 20  17    Creatinine 3.24 - 1.00 mg/dL 4.01  0.27  2.53   Sodium 135 - 145 mmol/L 138  136    Potassium 3.5 - 5.1 mmol/L 3.9  4.1    Chloride 98 - 111 mmol/L 105  100    CO2 22 - 32 mmol/L 24  26    Calcium 8.9 - 10.3 mg/dL 9.3  8.8      11.  Mild transaminitis with rhabdomyolysis.  Both resolved  12.  Hypotension. Resolved, d/c midodrine -d/c propanolol, decrease baclofen to 10mg  HS>> back up to 20mg  QHS, decrease tizanidine to 2mg  TID 11/2- BP was running 87 systolic last night- and max of 110 in last 24 hours- not dizzy per chart, but if develops any dizziness, suggest primary team restarts Midodrine. Amantadine can cause hypotension, FYI Decrease tizanidine to BID  -11/23-24/24 BPs soft but stable, pt asymptomatic, monitor   Vitals:   04/04/23 1304 04/04/23 2017 04/05/23 0259 04/05/23 1319  BP: 95/75 (!) 91/55 (!) 96/59 101/73   04/05/23 1910 04/06/23 0249 04/06/23 1308 04/06/23 2046  BP: 109/75 101/72 95/62 96/68    04/07/23 0603 04/07/23 1315  04/07/23 1931 04/08/23 0447  BP: 99/65 94/67 (!) 83/50 98/70   13. HTN: see above Increase tizanidine to 6mg  TID--now 4mg  TID>>2mg  TID. Magnesium supplement added HS. Increase propanolol to 20mg  TID>> d/c'd, d/c clonidine patch given hypotension  14. Impaired initiation: continue amantadine 100mg  BID  15. Hyperglycemia: CBGs mildly elevated, d/ced checks. Prosource d/ced as per patient's request  16. Spasticity:  -discussed serial casting with OT and her mom but she defers given pain she had when this was attempted -baclofen and tizanidine decreased due to hypotension, spasticity has been stable. Continue wrist, elbow brace, and PRAFOs. Ordered a night splint for LLE  Repeat CMP today given chronic tizanidine use -9/7 continues to have significant tone despite use of tizanidine and baclofen, may be beneficial consider baclofen pump in outpatient setting if this causes significant difficulties in care  Decrease tizanidine to BID  17.  Dyskinetic movements in head and neck - appears to be myoclonic jerks related to hypoxic brain injury - as discussed with pt mom, no specific meds for this on antispasticity meds , consioder benzo although with pt's SUD would avoid this class - EEG reviewed and was negative for seizure.  Baclofen decreased to 20mg  HS  18. Cervical extensor weakness: tizanidine decreased to 2mg  BID  19. Bowel and bladder incontinence: continue bowel and bladder program. Conitnue magneisum supplement  20. Fatigue: improved, discussed with team may be secondary to anti-spasticity medications, B12 reviewed and is 335, in suboptimal range, will give 1,000U B12 injection 8/7, will order monthly as needed, metanx started, increase to BID, continue, decrease tizanidine to BID, continue this dose  21. MRSA nares positive: negative on repeat, precautions d/ced  22. Tachycardic: resolved, d/c propanolol.  Continue Tizanidine increased to 6mg  TID>> now down to 2mg  TID. Resolved,  amantadine increased back to 100mg  BID, continue this dose, resolved  23. S/p fall: CT head reviewed and is stable  24. Bilateral foot drop: bilateral AFOs ordered, discussed that these can help her to walk better  25. Vaginal itching: clotrimazole ordered, discussed with patient, does not appear to be helping so will switch to fluconazole, resolved  26. Dysuria: UA/UC ordered, discussed negative, resolved  27. Foul smelling urine: asked nursing to encourage 6-8 glasses of water per day, fluconazole started for potential fungal infection given itching as well and negative UA, discussed that this has helped    LOS: 141 days A FACE TO FACE EVALUATION WAS PERFORMED  [address] 04/08/2023, 11:10 AM

## 2023-04-09 DIAGNOSIS — R300 Dysuria: Secondary | ICD-10-CM

## 2023-04-09 DIAGNOSIS — E861 Hypovolemia: Secondary | ICD-10-CM

## 2023-04-09 NOTE — Progress Notes (Signed)
Occupational Therapy Session Note  Patient Details  Name: Nicole [name] MRN: [id number] Date of Birth: [birth year]  Today's Date: 04/09/2023 OT Individual Time: 1300-1345 OT Individual Time Calculation (min): 45 min    Short Term Goals: Week 1:  OT Short Term Goal 1 (Week 1): Week 19:  Patient will stand at sink with min assist long enough for helper to pull up pants OT Short Term Goal 1 - Progress (Week 1): Progressing toward goal OT Short Term Goal 2 (Week 1): Week 19:  Patient will choose clothing items from drawer without cueing once placed in front of dresser OT Short Term Goal 2 - Progress (Week 1): Met OT Short Term Goal 3 (Week 1): Week 19:  Patient will don right sock on foot with min assist for trunk control OT Short Term Goal 3 - Progress (Week 1): Progressing toward goal OT Short Term Goal 4 (Week 1): Week 19:  Patient will utilize external cues to orient herself to time/ date Week 9: OT Short Term Goal 1 (Week 9): week 18: Pt will transfer to toilet with mod A with RW OT Short Term Goal 1 - Progress (Week 9): Met OT Short Term Goal 2 (Week 9): Week 18: Pt will be able to come to EOB in prep for ADLs with min A OT Short Term Goal 2 - Progress (Week 9): Progressing toward goal OT Short Term Goal 3 (Week 9): Pt will feed self 50% of meal with min A OT Short Term Goal 3 - Progress (Week 9): Met OT Short Term Goal 4 (Week 9): Week 18: Pt will use external aids to be oriented x3 with mod A OT Short Term Goal 4 - Progress (Week 9): Not met  Skilled Therapeutic Interventions/Progress Updates:      Therapy Documentation Precautions:  Precautions Precautions: Fall Precaution Comments: delayed processing, Increased tone UB and LB Required Braces or Orthoses: Other Brace Other Brace: B adjustable night splints; B elbow "cosey" splints; L palm protector; R mitt Restrictions Weight Bearing Restrictions: No General: "No pain today I'm happy!" Pt seated in W/C upon OT arrival,  agreeable to OT. Pt listening to reggae music for increased mood throughout session.  Pain: no pain reported  Exercises: Pt participated in [facility] activity with RUE and LUE, pt retrieving small pegs with RUE/LUE and placing them onto peg board for increased dexterity and coordination in order to complete ADLs such as buttoning shirts. Increased VC for use of LUE with limited carryover. Pt told to follow picture to copy onto peg board, pt with noted errors with redirection. Increased time to finish task with VC for redirection.   Pt seated in W/C at end of session with W/C alarm donned, call light within reach and 4Ps assessed. Pt mother present at end of session   Therapy/Group: Individual Therapy  Velia Meyer, OTD, OTR/L 04/09/2023, 3:56 PM

## 2023-04-09 NOTE — Progress Notes (Signed)
Physical Therapy Session Note  Patient Details  Name: Nicole [name] MRN: [id number] Date of Birth: May 08, 1976  Today's Date: 04/09/2023 PT Individual Time: 0915-1013 PT Individual Time Calculation (min): 58 min   Short Term Goals: Week 1:  PT Short Term Goal 1 (Week 1): Pt will ambulate 65ft with modA and LRAD PT Short Term Goal 1 - Progress (Week 1): Not met PT Short Term Goal 2 (Week 1): Pt will complete functional outcome measure to assess balance and falls risk PT Short Term Goal 2 - Progress (Week 1): Not met PT Short Term Goal 3 (Week 1): Week 13: Pt will complete bed<>chair transfers with totalA (pt <25%) PT Short Term Goal 3 - Progress (Week 1): Not met  Skilled Therapeutic Interventions/Progress Updates:      Pt in bed and has no pain. Reports dry brief but wants to sit on the toilet to try and urinate to avoid an accident. Supine<>sitting EOB with min/modA for initiation and for trunk support. Completes stand pivot transfer with min/modA into w/c with cues for setup and sequencing. Wheeled into bathroom and assisted patient on toilet (padded BSC over toilet) with minA with patient using grab bar to help pull from. Pt needing supervision for sitting balance as she was trying to void. She would stand and try and reposition herself with CGA, repositioning due to discomfort and soreness on her bottom. Removed padded BSC and just used the regular toilet height due to awkwardness from [facility] which pt reports improved comfort. Unfortunately, pt still unable to void despite ++ time. Returned to her w/c and provided and donned a new clean brief with totalA for time. Donned pants with modA for threading - she assisted in pulling them up in standing with modA for standing balance. Donned socks, tennis shoe, and L CAM boot with dependent assist.   Transported to main rehab gym at whelechair level. Pt requesting to work on walking.   Wrapped her LLE with red TB to facilitate more hip/knee flexion  during gait. Sit<>Stand to RW with min/modA with cues for monitoring her R foot to prevent tucking too far underneath her. She ambulates 56ft + 64ft with modA and RW - improved heel strike and foot clearance on LLE with the full leg wrap using the theraband. She still shows a very flexed posture, especially at the trunk, and has significant difficulty correcting this.   Pt returned to her room and she was left sitting upright in w/c with safety belt alarm on and all needs met. Pt thankful for care.   Therapy Documentation Precautions:  Precautions Precautions: Fall Precaution Comments: delayed processing, Increased tone UB and LB Required Braces or Orthoses: Other Brace Other Brace: B adjustable night splints; B elbow "cosey" splints; L palm protector; R mitt Restrictions Weight Bearing Restrictions: No General:    Therapy/Group: Individual Therapy  Callee Rohrig P Laine Giovanetti 04/09/2023, 7:45 AM

## 2023-04-09 NOTE — Progress Notes (Signed)
PROGRESS NOTE   Subjective/Complaints:  C/o of urinary frequency/odor, vaginal discharge. Otherwise no new issues today  ROS: Patient denies fever, rash, sore throat, blurred vision, dizziness, nausea, vomiting, diarrhea, cough, shortness of breath or chest pain, joint or back/neck pain, headache, or mood change.   Objective:   No results found. No results for input(s): "WBC", "HGB", "HCT", "PLT" in the last 72 hours.      No results for input(s): "NA", "K", "CL", "CO2", "GLUCOSE", "BUN", "CREATININE", "CALCIUM" in the last 72 hours.       Intake/Output Summary (Last 24 hours) at 04/09/2023 0923 Last data filed at 04/08/2023 1836 Gross per 24 hour  Intake 480 ml  Output --  Net 480 ml       Pressure Injury 02/09/23 Heel Left Stage 3 -  Full thickness tissue loss. Subcutaneous fat may be visible but bone, tendon or muscle are NOT exposed. (Active)  02/09/23 0543  Location: Heel  Location Orientation: Left  Staging: Stage 3 -  Full thickness tissue loss. Subcutaneous fat may be visible but bone, tendon or muscle are NOT exposed.  Wound Description (Comments):   Present on Admission: No    Physical Exam: Vital Signs Blood pressure 91/60, pulse 67, temperature 97.9 F (36.6 C), temperature source Oral, resp. rate 18, height 5\' 1"  (1.549 m), weight 51.8 kg, SpO2 100%.   Constitutional: No distress . Vital signs reviewed. HEENT: NCAT, EOMI, oral membranes moist Neck: supple Cardiovascular: RRR without murmur. No JVD    Respiratory/Chest: CTA Bilaterally without wheezes or rales. Normal effort    GI/Abdomen: BS +, non-tender, non-distended Ext: no clubbing, cyanosis, or edema Psych: flat but cooperative  PRIOR EXAMS: Skin: + Scaling dried skin on bilateral heels  - 2 small excoriations, sore on the base of the 5th metatarsal head -no longer present - rash on face - no longer present - stage 2 to right toe  -no longer present  + area of redness between left thumb and index finger-now minimal, purple bruise left buttock unstageable - not examined +redness to bilateral elbows -not appreciated + Left heel stage III DTI, covered in foam dressing  Neurologic: AAOx2,  to year but not month or date. Follows most simple commands.  + Hypophonation, speech intelligability much improved MAS 1 right elbow,MAS 1 in RIght finger and wrist flexors  MAS 3 knee flexors, and MAS 3 L>R  hip adductors  MAS 3 L elbow , MAS 3-4 in L wrist and finger flexors --stable neuro exam 11/25 + LUE WHO--- Hypersensitivity to left hand improved, +left hand contracture, Left sided strength improved, decreased range of motion and pain at end range of motion in left shoulder  Musculoskeletal: reduced bilateral shoulder and elbow ROM due to increased tone , knee pain    Assessment/Plan: 1. Functional deficits which require 3+ hours per day of interdisciplinary therapy in a comprehensive inpatient rehab setting. Physiatrist is providing close team supervision and 24 hour management of active medical problems listed below. Physiatrist and rehab team continue to assess barriers to discharge/monitor patient progress toward functional and medical goals  Care Tool:  Bathing    Body parts bathed by patient: Face, Abdomen,  Chest, Left upper leg, Right upper leg, Left arm, Front perineal area, Right arm, Right lower leg   Body parts bathed by helper: Buttocks, Left lower leg Body parts n/a: Right arm, Left arm, Front perineal area, Buttocks, Right upper leg, Left upper leg, Right lower leg, Left lower leg   Bathing assist Assist Level: Minimal Assistance - Patient > 75%     Upper Body Dressing/Undressing Upper body dressing   What is the patient wearing?: Pull over shirt    Upper body assist Assist Level: Contact Guard/Touching assist    Lower Body Dressing/Undressing Lower body dressing      What is the patient wearing?:  Incontinence brief, Pants     Lower body assist Assist for lower body dressing: Moderate Assistance - Patient 50 - 74%     Toileting Toileting Toileting Activity did not occur Press photographer and hygiene only): N/A (no void or bm)  Toileting assist Assist for toileting: Maximal Assistance - Patient 25 - 49%     Transfers Chair/bed transfer  Transfers assist  Chair/bed transfer activity did not occur: Safety/medical concerns  Chair/bed transfer assist level: Minimal Assistance - Patient > 75%     Locomotion Ambulation   Ambulation assist   Ambulation activity did not occur: Safety/medical concerns  Assist level: Moderate Assistance - Patient 50 - 74% Assistive device: Walker-rolling Max distance: 104ft   Walk 10 feet activity   Assist  Walk 10 feet activity did not occur: Safety/medical concerns  Assist level: Moderate Assistance - Patient - 50 - 74% Assistive device: Walker-rolling   Walk 50 feet activity   Assist Walk 50 feet with 2 turns activity did not occur: Safety/medical concerns         Walk 150 feet activity   Assist Walk 150 feet activity did not occur: Safety/medical concerns         Walk 10 feet on uneven surface  activity   Assist Walk 10 feet on uneven surfaces activity did not occur: Safety/medical concerns         Wheelchair     Assist Is the patient using a wheelchair?: Yes Type of Wheelchair: Manual    Wheelchair assist level: Dependent - Patient 0% Max wheelchair distance: 94ft    Wheelchair 50 feet with 2 turns activity    Assist    Wheelchair 50 feet with 2 turns activity did not occur: Safety/medical concerns   Assist Level: Dependent - Patient 0%   Wheelchair 150 feet activity     Assist  Wheelchair 150 feet activity did not occur: Safety/medical concerns   Assist Level: Dependent - Patient 0%   Blood pressure 91/60, pulse 67, temperature 97.9 F (36.6 C), temperature source Oral, resp.  rate 18, height 5\' 1"  (1.549 m), weight 51.8 kg, SpO2 100%.  Medical Problem List and Plan: 1. 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 Awaiting approval of disability Code status  DNR -Continue CIR therapies including PT, OT, and SLP   2.  Impaired mobility: continue Lovenox 30mg  daily, weekly creatinine ordered             -  antiplatelet therapy: N/A  3. Pain:  -Knee pain: Tylenol as needed, continue voltaren gel scheduled. XR reviewed and is stable, discussed with therapy -Right wrist swelling: XR ordered and shows no acute fracture, shows 2.93mm ulnar variance, brace removed to given patient a break from it. Voltaren gel ordered prn for discomfort, continue, CT ordered and show no acute osseus injury, continue ace wrap   -Foot pain: tramadol ordered prn, improved today, continue prn  -Headaches: continue tylenol and tramadol prn -Low back pain: continue kpad, asked [place] to please bring for her -Whole body hypersensitivity: gabapentin 100mg  TID ordered -Pain in left heel: XR obtained and negative, cam boot ordered to help offload heel with therapy, discussed that this has helped -R shoulder pain: XR neg for acute etiology on 04/06/23, voltaren gel ordered--improved 11/25  4. History of anxiety: discussed with her mom that she feels this was a big risk factor for her accident, propanolol d/ced due to hypotension. Continue magnesium supplement  5. Neuropsych/cognition: This patient is not capable of making decisions on her own behalf  - 10/12: Still not capable of medical decision making; BUT oriented and communicating, following directions -  MUCH improved - PRN melatonin 3mg    for insomnia  6. Skin: -Left buttock unstageable ulcer.  Continue Medihoney to buttock wound daily cover with foam dressing.  Changing dressing every 3 days or as needed soiling -Left hand skin maceration: Eucerin ordered, discussed hand split with OT, continue -Area of redness between left thumb and index finger: continue foam dressing -Scalp eczema: selsun blue ordered, continue -9/7 patient's mother feels like this is causing her to itch, will start some hydrocortisone cream -Small sore on the base of 5th metatarsal head: local wound care, continue -Rash on face: resolved, discussed with mother could have been secondary to added sugars in nutritional supplements -Sacral pressure injury: air mattress ordered, Placed nursing order to offload q1 hour while awake, continue medihoney, wound care consulted, discussed with nursing that this is improving -Blood blister to left heel: continue foam heel protectors.  7. Fluids/Electrolytes/Nutrition: Routine in and outs with follow-up chemistries  -Eating  well with cueing   -Dysphagia: continue D3/thins--tolerating this diet -Low protein stores: d/c prosource since patient hates these and is eating 100% of meals -Suboptimal vitamin D level: increase to 5,000 U daily. continue this dose  -History of magnesium deficiency: magnesium 500mg  started HS  8.  Cavitary right lower lobe pneumonia likely aspiration pneumonia/MRSA pneumonia.  Resolved    Latest Ref Rng & Units 04/04/2023    6:09 AM 02/05/2023   11:12 AM 01/22/2023    6:36 AM  CBC  WBC 4.0 - 10.5 K/uL 5.6  8.5  8.1   Hemoglobin 12.0 - 15.0 g/dL 44.0  34.7  42.5   Hematocrit 36.0 - 46.0 % 42.1  46.7  47.5   Platelets 150 - 400 K/uL 224  284  274      9.  History of drug abuse.  Positive cocaine on urine drug screen.  Provide counseling  10.  AKI/hypovolemia and ATN.  Resolved Appears to have fair fluid intake, creatinine has been stable  11/3- Push  fluids due ot low BP  -04/07/23 Cr stable 04/04/23,      Latest Ref Rng & Units 04/04/2023    6:09 AM 04/02/2023    3:39 PM 03/30/2023    5:35 AM  BMP  Glucose 70 - 99 mg/dL 91  85    BUN 6 - 20 mg/dL 20  17  Creatinine 0.44 - 1.00 mg/dL 4.13  2.44  0.10   Sodium 135 - 145 mmol/L 138  136    Potassium 3.5 - 5.1 mmol/L 3.9  4.1    Chloride 98 - 111 mmol/L 105  100    CO2 22 - 32 mmol/L 24  26    Calcium 8.9 - 10.3 mg/dL 9.3  8.8      11.  Mild transaminitis with rhabdomyolysis.  Both resolved  12.  Hypotension. Resolved, d/c midodrine -d/c propanolol, decrease baclofen to 10mg  HS>> back up to 20mg  QHS, decrease tizanidine to 2mg  TID 11/2- BP was running 87 systolic last night- and max of 110 in last 24 hours- not dizzy per chart, but if develops any dizziness, suggest primary team restarts Midodrine. Amantadine can cause hypotension, FYI Decrease tizanidine to BID  -04/09/23 bp's still soft-decreasing tizanidine should help  Vitals:   04/05/23 1319 04/05/23 1910 04/06/23 0249 04/06/23 1308  BP: 101/73 109/75 101/72 95/62   04/06/23 2046 04/07/23 0603 04/07/23 1315 04/07/23 1931  BP: 96/68 99/65 94/67  (!) 83/50   04/08/23 0447 04/08/23 1339 04/08/23 1959 04/09/23 0543  BP: 98/70 (!) 91/56 (!) 90/55 91/60   13. HTN: see above Increase tizanidine to 6mg  TID--now 4mg  TID>>2mg  TID. Magnesium supplement added HS. Increase propanolol to 20mg  TID>> d/c'd, d/c clonidine patch given hypotension  14. Impaired initiation: continue amantadine 100mg  BID  15. Hyperglycemia: CBGs mildly elevated, d/ced checks. Prosource d/ced as per patient's request  16. Spasticity:  -discussed serial casting with OT and her mom but she defers given pain she had when this was attempted -baclofen and tizanidine decreased due to hypotension, spasticity has been stable. Continue wrist, elbow brace, and PRAFOs. Ordered a night splint for LLE  Repeat CMP today given chronic tizanidine use -9/7 continues to  have significant tone despite use of tizanidine and baclofen, may be beneficial consider baclofen pump in outpatient setting if this causes significant difficulties in care  11/25 continue tizanidine   BID  17.  Dyskinetic movements in head and neck - appears to be myoclonic jerks related to hypoxic brain injury - as discussed with pt mom, no specific meds for this on antispasticity meds , consioder benzo although with pt's SUD would avoid this class - EEG reviewed and was negative for seizure.  Baclofen decreased to 20mg  HS  18. Cervical extensor weakness: tizanidine decreased to 2mg  BID  19. Bowel and bladder incontinence: continue bowel and bladder program. Conitnue magneisum supplement  20. Fatigue: improved, discussed with team may be secondary to anti-spasticity medications, B12 reviewed and is 335, in suboptimal range, will give 1,000U B12 injection 8/7, will order monthly as needed, metanx started, increase to BID, continue, decrease tizanidine to BID, continue this dose  21. MRSA nares positive: negative on repeat, precautions d/ced  22. Tachycardic: resolved, d/c propanolol.  Continue Tizanidine increased to 6mg  TID>> now down to 2mg  TID. Resolved, amantadine increased back to 100mg  BID, continue this dose, resolved  23. S/p fall: CT head reviewed and is stable  24. Bilateral foot drop: bilateral AFOs ordered, discussed that these can help her to walk better  25. Vaginal itching: completed fluconazole. Having some discharge now. See below  26. Urinary frequency/odor: recurrent, new UA/UCX ordered 11/25       LOS: 142 days A FACE TO FACE EVALUATION WAS PERFORMED  Ranelle Oyster 04/09/2023, 9:23 AM

## 2023-04-09 NOTE — Progress Notes (Signed)
Physical Therapy Session Note  Patient Details  Name: Nicole [name] MRN: [id number] Date of Birth: 1975/06/02  Today's Date: 04/09/2023 PT Individual Time: 1415-1455 PT Individual Time Calculation (min): 40 min   Short Term Goals: Week 2:  PT Short Term Goal 1 (Week 2): Week 16: Pt will complete bed mobility with modA consistently PT Short Term Goal 2 (Week 2): Week 16: Pt will complete bed<>Chair transfers wtith maxA PT Short Term Goal 3 (Week 2): Week 16: Pt will propel wheelchair 7ft with modA   Skilled Therapeutic Interventions/Progress Updates:    Pt eager and agreeable to session. Donned shoes and L CAM boot total assist for time management. Discussed overall goals and progress with therapies today. Focused on NMR to address sit <> stands, postural control retraining, balance, and fine motor tasks in standing for increased challenge. Pt performed sit <.> stands with overall min assist with verbal and tactile cues for hand placement and technique. Maintains standing with overall min assist through as fatigues leans L and increased to mod assist to facilitate weightshift back to neutral. Tactile and verbal cues throughout standing to engage trunk extension and hip/knee extension. Performed majority of tasks (squiggz and use of bug puzzle) with R hand with encouragement to use L hand for stabilizing as well as incorporating it functionally at times - mod to max cues and demonstration for carryover to perform tasks presented (ex. Line up items by color, move them to this pile, etc). Extra time needed as well. Rest breaks seated as fatigued. Maintained standing about 5 min for last trial. Encouraged pt to assist with w/c parts management during transfers. End of session transferred back to bed per patient request with min assist initially for transfer but then mod for actual pivot due to pt changing her position mid transfer. Scooted on EOB and repositioned in bed with scooting in supine and  multimodal cues for technique. Pt with overall good tolerance of session.   Therapy Documentation Precautions:  Precautions Precautions: Fall Precaution Comments: delayed processing, Increased tone UB and LB Required Braces or Orthoses: Other Brace Other Brace: B adjustable night splints; B elbow "cosey" splints; L palm protector; R mitt Restrictions Weight Bearing Restrictions: No    Pain:  Denies pain.     Therapy/Group: Individual Therapy  Karolee Stamps Darrol Poke, PT, DPT, CBIS  04/09/2023, 2:58 PM

## 2023-04-09 NOTE — Progress Notes (Addendum)
Occupational Therapy Session Note  Patient Details  Name: Nicole [name] MRN: [id number] Date of Birth: 1975-08-09  Today's Date: 04/09/2023 OT Individual Time: 4098-1191 OT Individual Time Calculation (min): 60 min    Short Term Goals: Week 19:  OT Short Term Goal 1 (Week 1): Week 19:  Patient will stand at sink with min assist long enough for helper to pull up pants  OT Short Term Goal 2 (Week 1): Week 19:  Patient will choose clothing items from drawer without cueing once placed in front of dresser  OT Short Term Goal 3 (Week 1): Week 19:  Patient will don right sock on foot with min assist for trunk control  OT Short Term Goal 4 (Week 1): Week 19:  Patient will utilize external cues to orient herself to time/ date  Skilled Therapeutic Interventions/Progress Updates:    Patient received seated in wheelchair, agreeable to OT session.  Transported to gym to address scapular/ glenohumeral joint mobilization in BUE.  Patient with limited separation between humerus and scapula movement in left shoulder.  Worked on mobilizing scapula initially, then Worked on stabilizing scapula while moving humerus through available range.  Patient with slightly improved GH flexion and ext rotation following manual techniques.  Positioned patient in prone on elbow to encourage more activation of thoracic extensors, and also to encourage activation of scapular stabilizers.  Patient able to extend within her available range but had difficulty sustaining extension.  Worked on use of extension in standing - sit to stand with hands on chair and close supervision, then manual assistance to align left leg in extension.  Worked on reducing load on Ue's in standing and lifting arms up in air.  Also worked seated on physioball to address low back/ hip tightness and to promote moving in and out of flexed/rotated positions.   Transported back to room, and patient indicated need to void.  Had immediate urgency with bladder and  unable to hold long enough for toilet transfer.  Assisted patient to toilet and helped her to change clothing as she soaked through her pants.  Patient able to wash self while seated on toilet and assist in removing and replacing pants.  Significantly improved stand tolerance.  Left up in wheelchair with safety belt in place and engaged and call bell/ personal items in reach.    Therapy Documentation Precautions:  Precautions Precautions: Fall Precaution Comments: delayed processing, Increased tone UB and LB Required Braces or Orthoses: Other Brace Other Brace: B adjustable night splints; B elbow "cosey" splints; L palm protector; R mitt Restrictions Weight Bearing Restrictions: No   Pain:  3/10 shoulders     Therapy/Group: Individual Therapy  [name] 04/09/2023, 2:00 PM

## 2023-04-10 DIAGNOSIS — N390 Urinary tract infection, site not specified: Secondary | ICD-10-CM

## 2023-04-10 DIAGNOSIS — A499 Bacterial infection, unspecified: Secondary | ICD-10-CM

## 2023-04-10 LAB — URINALYSIS, W/ REFLEX TO CULTURE (INFECTION SUSPECTED)
Bilirubin Urine: NEGATIVE
Glucose, UA: NEGATIVE mg/dL
Hgb urine dipstick: NEGATIVE
Ketones, ur: NEGATIVE mg/dL
Nitrite: POSITIVE — AB
Protein, ur: NEGATIVE mg/dL
Specific Gravity, Urine: 1.012 (ref 1.005–1.030)
pH: 6 (ref 5.0–8.0)

## 2023-04-10 MED ORDER — AMOXICILLIN 250 MG PO CAPS
250.0000 mg | ORAL_CAPSULE | Freq: Three times a day (TID) | ORAL | Status: AC
  Administered 2023-04-10 – 2023-04-16 (×20): 250 mg via ORAL
  Filled 2023-04-10 (×21): qty 1

## 2023-04-10 MED ORDER — TIZANIDINE HCL 4 MG PO TABS
2.0000 mg | ORAL_TABLET | Freq: Three times a day (TID) | ORAL | Status: DC
  Administered 2023-04-10 – 2023-04-17 (×21): 2 mg via ORAL
  Filled 2023-04-10 (×21): qty 1

## 2023-04-10 NOTE — Consult Note (Signed)
WOC Nurse wound follow up; WOC team following weekly for R heel PI  Wound type: healing Stage 3 Pressure Injury R heel  Measurement: 0.3 cm x 0.3 cm  Wound bed: 100% dry red  Drainage (amount, consistency, odor) none  Periwound: intact; not wearing splint boot at this visit  Dressing procedure/placement/frequency: Continue with foam dressing to protect area.   WOC team will reassess every 7 to 10 days.   Thank you,    Priscella Mann MSN, RN-BC, [employer] [phone]

## 2023-04-10 NOTE — Progress Notes (Signed)
PROGRESS NOTE   Subjective/Complaints:  Still with urinary complaints. Therapy in room with her. Lower extremity spasticity remains an issue also  ROS: Patient denies fever, rash, sore throat, blurred vision, dizziness, nausea, vomiting, diarrhea, cough, shortness of breath or chest pain,   Objective:   No results found. No results for input(s): "WBC", "HGB", "HCT", "PLT" in the last 72 hours.      No results for input(s): "NA", "K", "CL", "CO2", "GLUCOSE", "BUN", "CREATININE", "CALCIUM" in the last 72 hours.       Intake/Output Summary (Last 24 hours) at 04/10/2023 0903 Last data filed at 04/09/2023 1757 Gross per 24 hour  Intake 711 ml  Output --  Net 711 ml       Pressure Injury 02/09/23 Heel Left Stage 3 -  Full thickness tissue loss. Subcutaneous fat may be visible but bone, tendon or muscle are NOT exposed. (Active)  02/09/23 0543  Location: Heel  Location Orientation: Left  Staging: Stage 3 -  Full thickness tissue loss. Subcutaneous fat may be visible but bone, tendon or muscle are NOT exposed.  Wound Description (Comments):   Present on Admission: No    Physical Exam: Vital Signs Blood pressure 98/71, pulse 66, temperature 98.6 F (37 C), resp. rate 17, height 5\' 1"  (1.549 m), weight 51.8 kg, SpO2 98%.   Constitutional: No distress . Vital signs reviewed. HEENT: NCAT, EOMI, oral membranes moist Neck: supple Cardiovascular: RRR without murmur. No JVD    Respiratory/Chest: CTA Bilaterally without wheezes or rales. Normal effort    GI/Abdomen: BS +, non-tender, non-distended Ext: no clubbing, cyanosis, or edema Psych: flat  PRIOR EXAMS: Skin: + Scaling dried skin on bilateral heels left buttock unstageable - not examined + Left heel stage III DTI, covered in foam dressing  Neurologic: AAOx2,  to year but not month or date. Follows most simple commands.  + Hypophonation, speech  intelligability much improved MAS 1 right elbow,MAS  tr to 1 in RIght finger and wrist flexors  MAS 2-3 in extensors of LE's MAS 1 L elbow , MAS 1-2 in L wrist and finger flexors. Hypersensitivity to left hand improved, +left hand contracture, Left sided strength improved, decreased range of motion and pain at end range of motion in left shoulder  Musculoskeletal: reduced bilateral shoulder and elbow ROM due to increased tone ,pain with palpation of right shoulder    Assessment/Plan: 1. Functional deficits which require 3+ hours per day of interdisciplinary therapy in a comprehensive inpatient rehab setting. Physiatrist is providing close team supervision and 24 hour management of active medical problems listed below. Physiatrist and rehab team continue to assess barriers to discharge/monitor patient progress toward functional and medical goals  Care Tool:  Bathing    Body parts bathed by patient: Face, Abdomen, Chest, Left upper leg, Right upper leg, Left arm, Front perineal area, Right arm, Right lower leg   Body parts bathed by helper: Buttocks, Left lower leg Body parts n/a: Right arm, Left arm, Front perineal area, Buttocks, Right upper leg, Left upper leg, Right lower leg, Left lower leg   Bathing assist Assist Level: Minimal Assistance - Patient > 75%     Upper Body  Dressing/Undressing Upper body dressing   What is the patient wearing?: Pull over shirt    Upper body assist Assist Level: Contact Guard/Touching assist    Lower Body Dressing/Undressing Lower body dressing      What is the patient wearing?: Incontinence brief, Pants     Lower body assist Assist for lower body dressing: Moderate Assistance - Patient 50 - 74%     Toileting Toileting Toileting Activity did not occur Press photographer and hygiene only): N/A (no void or bm)  Toileting assist Assist for toileting: Maximal Assistance - Patient 25 - 49%     Transfers Chair/bed transfer  Transfers  assist  Chair/bed transfer activity did not occur: Safety/medical concerns  Chair/bed transfer assist level: Moderate Assistance - Patient 50 - 74%     Locomotion Ambulation   Ambulation assist   Ambulation activity did not occur: Safety/medical concerns  Assist level: Moderate Assistance - Patient 50 - 74% Assistive device: Walker-rolling Max distance: 66ft   Walk 10 feet activity   Assist  Walk 10 feet activity did not occur: Safety/medical concerns  Assist level: Moderate Assistance - Patient - 50 - 74% Assistive device: Walker-rolling   Walk 50 feet activity   Assist Walk 50 feet with 2 turns activity did not occur: Safety/medical concerns         Walk 150 feet activity   Assist Walk 150 feet activity did not occur: Safety/medical concerns         Walk 10 feet on uneven surface  activity   Assist Walk 10 feet on uneven surfaces activity did not occur: Safety/medical concerns         Wheelchair     Assist Is the patient using a wheelchair?: Yes Type of Wheelchair: Manual    Wheelchair assist level: Dependent - Patient 0% Max wheelchair distance: 104ft    Wheelchair 50 feet with 2 turns activity    Assist    Wheelchair 50 feet with 2 turns activity did not occur: Safety/medical concerns   Assist Level: Dependent - Patient 0%   Wheelchair 150 feet activity     Assist  Wheelchair 150 feet activity did not occur: Safety/medical concerns   Assist Level: Dependent - Patient 0%   Blood pressure 98/71, pulse 66, temperature 98.6 F (37 C), resp. rate 17, height 5\' 1"  (1.549 m), weight 51.8 kg, SpO2 98%.  Medical Problem List and Plan: 1. 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 Awaiting approval of disability. SW has been working tirelessly with TOC/others on placement. Disability  remains sticking point Code status  DNR -Continue CIR therapies including PT, OT, and SLP    2.  Impaired mobility: continue Lovenox 30mg  daily, weekly creatinine ordered             -antiplatelet therapy: N/A  3. Pain:  -Knee pain: Tylenol as needed, continue voltaren gel scheduled. XR reviewed and is stable, discussed with therapy -Right wrist swelling: XR ordered and shows no acute fracture, shows 2.88mm ulnar variance, brace removed to given patient a break from it. Voltaren gel ordered prn for discomfort, continue, CT  ordered and show no acute osseus injury, continue ace wrap   -Foot pain: tramadol ordered prn, improved today, continue prn  -Headaches: continue tylenol and tramadol prn -Low back pain: continue kpad, asked [place] to please bring for her -Whole body hypersensitivity: gabapentin 100mg  TID ordered -Pain in left heel: XR obtained and negative, cam boot ordered to help offload heel with therapy, discussed that this has helped -R shoulder pain: XR neg for acute etiology on 04/06/23, voltaren gel ordered--encourage ROM as tolerated  4. History of anxiety: discussed with her mom that she feels this was a big risk factor for her accident, propanolol d/ced due to hypotension. Continue magnesium supplement  5. Neuropsych/cognition: This patient is not capable of making decisions on her own behalf  - 10/12: Still not capable of medical decision making; BUT oriented and communicating, following directions - MUCH improved - PRN melatonin 3mg    for insomnia  6. Skin: -Buttock/sacrum--foam dressing, pressure relief, nutrition -stage 3 left heel, continue foam dressing/ pressure relief  7. Fluids/Electrolytes/Nutrition: Routine in and outs with follow-up chemistries  -Eating  well  with cueing   -Dysphagia: continue D3/thins--tolerating this diet -Low protein stores: d/c prosource since patient hates these and is eating 100% of meals -Suboptimal vitamin D level: increase to 5,000 U daily. continue this dose  -History of magnesium deficiency: magnesium 500mg  started HS  8.  Cavitary right lower lobe pneumonia likely aspiration pneumonia/MRSA pneumonia.  Resolved    Latest Ref Rng & Units 04/04/2023    6:09 AM 02/05/2023   11:12 AM 01/22/2023    6:36 AM  CBC  WBC 4.0 - 10.5 K/uL 5.6  8.5  8.1   Hemoglobin 12.0 - 15.0 g/dL 10.2  72.5  36.6   Hematocrit 36.0 - 46.0 % 42.1  46.7  47.5   Platelets 150 - 400 K/uL 224  284  274      9.  History of drug abuse.  Positive cocaine on urine drug screen.  Provide counseling  10.  AKI/hypovolemia and ATN.  Resolved Appears to have fair fluid intake, creatinine has been stable  11/3- Push fluids due ot low BP  -04/07/23 Cr stable 04/04/23,      Latest Ref Rng & Units 04/04/2023    6:09 AM 04/02/2023    3:39 PM 03/30/2023    5:35 AM  BMP  Glucose 70 - 99 mg/dL 91  85    BUN 6 - 20 mg/dL 20  17    Creatinine 4.40 - 1.00 mg/dL 3.47  4.25  9.56   Sodium 135 - 145 mmol/L 138  136    Potassium 3.5 - 5.1 mmol/L 3.9  4.1    Chloride 98 - 111 mmol/L 105  100    CO2 22 - 32 mmol/L 24  26    Calcium 8.9 - 10.3 mg/dL 9.3  8.8      11.  Mild transaminitis with rhabdomyolysis.  Both resolved  12.  Hypotension. Resolved, d/c midodrine -d/c propanolol, decrease baclofen to 10mg  HS>> back up to 20mg  QHS, decrease tizanidine to 2mg  TID 11/2- BP was running 87 systolic last night- and max of 110 in last 24 hours- not dizzy per chart, but if develops any dizziness, suggest primary team restarts Midodrine. Amantadine can cause hypotension, FYI Decrease tizanidine to BID  -04/09/23 bp's still soft-decreasing tizanidine should help  Vitals:   04/06/23 0249 04/06/23 1308 04/06/23 2046 04/07/23 0603  BP: 101/72 95/62 96/68  99/65    04/07/23 1315 04/07/23 1931  04/08/23 0447 04/08/23 1339  BP: 94/67 (!) 83/50 98/70 (!) 91/56   04/08/23 1959 04/09/23 0543 04/09/23 2053 04/10/23 0452  BP: (!) 90/55 91/60 (!) 88/63 98/71   13. HTN: see above Increase tizanidine to 6mg  TID--now 4mg  TID>>2mg  TID. Magnesium supplement added HS. Increase propanolol to 20mg  TID>> d/c'd, d/c clonidine patch given hypotension  14. Impaired initiation: continue amantadine 100mg  BID  15. Hyperglycemia: CBGs mildly elevated, d/ced checks. Prosource d/ced as per patient's request  16. Spasticity:  -discussed serial casting with OT and her mom but she defers given pain she had when this was attempted -baclofen and tizanidine decreased due to hypotension, spasticity has been stable. Continue wrist, elbow brace, and PRAFOs. Ordered a night splint for LLE  Repeat CMP today given chronic tizanidine use -9/7 continues to have significant tone despite use of tizanidine and baclofen, may be beneficial consider baclofen pump in outpatient setting if this causes significant difficulties in care  11/26 increase tizanidine to 2mg  tid, eventually to 4mg  tid by end of week  17.  Dyskinetic movements in head and neck - appears to be myoclonic jerks related to hypoxic brain injury - as discussed with pt mom, no specific meds for this on antispasticity meds , consioder benzo although with pt's SUD would avoid this class - EEG reviewed and was negative for seizure.  Baclofen decreased to 20mg  HS  18. Cervical extensor weakness: tizanidine decreased to 2mg  BID  19. Bowel and bladder incontinence: continue bowel and bladder program. Conitnue magneisum supplement  20. Fatigue: improved, discussed with team may be secondary to anti-spasticity medications, B12 reviewed and is 335, in suboptimal range, will give 1,000U B12 injection 8/7, will order monthly as needed, metanx started, increase to BID, continue, decrease tizanidine to BID, continue this dose  21. MRSA nares  positive: negative on repeat, precautions d/ced  22. Tachycardic: resolved, d/c propanolol.  Continue Tizanidine increased to 6mg  TID>> now down to 2mg  TID. Resolved, amantadine increased back to 100mg  BID, continue this dose, resolved  23. S/p fall: CT head reviewed and is stable  24. Bilateral foot drop: bilateral AFOs ordered, discussed that these can help her to walk better  25. Vaginal itching: completed fluconazole. Having some discharge now. See below  26. Urinary frequency/odor: [facility] Shock pending  -11/26 begin empiric amoxil       LOS: 143 days A FACE TO FACE EVALUATION WAS PERFORMED  Ranelle Oyster 04/10/2023, 9:03 AM

## 2023-04-10 NOTE — Progress Notes (Signed)
Patient concerned about incontinence and how to improve. Nurse educated patient on q2hr toileting and she liked the idea. Patient was able to have a continent episode when waiting 2hrs after incontinent episode. Patient may benefit from this over night as well as during the day.

## 2023-04-10 NOTE — Progress Notes (Signed)
Physical Therapy Session Note  Patient Details  Name: Nicole [name] MRN: [id number] Date of Birth: 04-Aug-1975  Today's Date: 04/10/2023 PT Individual Time: 0630-1601 PT Individual Time Calculation (min): 47 min   Short Term Goals: Week 9: PT Short Term Goal 1 (Week 9): Pt will initiate bed mobility with totalA PT Short Term Goal 2 (Week 9): Pt will maintain unsupported sitting balance for 30 seconds without LOB PT Short Term Goal 3 (Week 9): Pt will complete bed<>chair transfers with no more than totalA ***  Skilled Therapeutic Interventions/Progress Updates:  Patient *** on entrance to room. Patient alert and agreeable to PT session.   Patient with no pain complaint at start of session.  Therapeutic Activity: Bed Mobility: Pt performed supine <> sit with ***. VC/ tc required for ***. Transfers: Pt performed sit<>stand and stand pivot transfers throughout session with ***. Provided verbal cues for***.  Gait Training:  Pt ambulated *** ft using *** with ***. Demonstrated ***. Provided vc/ tc for ***.  Wheelchair Mobility:  Pt propelled wheelchair *** feet with ***. Provided vc/ tc for ***.  Neuromuscular Re-ed: NMR facilitated during session with focus on***. Pt guided in ***. NMR performed for improvements in motor control and coordination, balance, sequencing, judgement, and self confidence/ efficacy in performing all aspects of mobility at highest level of independence.   Patient *** at end of session with brakes locked, *** alarm set, and all needs within reach.   Therapy Documentation Precautions:  Precautions Precautions: Fall Precaution Comments: delayed processing, Increased tone UB and LB Required Braces or Orthoses: Other Brace Other Brace: B adjustable night splints; B elbow "cosey" splints; L palm protector; R mitt Restrictions Weight Bearing Restrictions: No General:   Vital Signs:   Pain: Pain Assessment Pain Scale: 0-10 Pain Score: 0-No  pain Mobility:   Locomotion :    Trunk/Postural Assessment :    Balance:   Exercises:   Other Treatments:      Therapy/Group: {Therapy/Group:[id number]}  Loel Dubonnet 04/10/2023, 8:59 AM

## 2023-04-10 NOTE — Progress Notes (Signed)
Physical Therapy Session Note  Patient Details  Name: Nicole [name] MRN: [id number] Date of Birth: [birth year]  Today's Date: 04/10/2023 PT Individual Time: 4098-1191 PT Individual Time Calculation (min): 43 min   Short Term Goals: Week 1:  PT Short Term Goal 1 (Week 1): Pt will ambulate 54ft with modA and LRAD PT Short Term Goal 1 - Progress (Week 1): Not met PT Short Term Goal 2 (Week 1): Pt will complete functional outcome measure to assess balance and falls risk PT Short Term Goal 2 - Progress (Week 1): Not met PT Short Term Goal 3 (Week 1): Week 13: Pt will complete bed<>chair transfers with totalA (pt <25%) PT Short Term Goal 3 - Progress (Week 1): Not met  Skilled Therapeutic Interventions/Progress Updates:        Pt received sitting in wheelchair - agreeable to therapy treatment. Has her L CAM boot on.   Retrieved a youth RW to improve posture and UE support with gait training. Adjusted to fit.   Gait training during session ~50ft + ~60 + ~151ft with minA for the first ~63ft and modA for the remaining distances. Pt with improved upright posture, stride length, and stability with the youth RW. She continues to show some motor planning deficits while ambulation, variable step length, and narrow BOS.   Pt requesting assistance for stretching her shoulders. Provided pectoral stretch with patient sitting EOM - PT on therapy ball behind to to facilitate thoracic extension and shoulder abduction. Pt with improved upright sitting afterwards with better posture and more of an anterior pelvic tilt.   Pt returned to her room at end of treatment. Left sitting upright with chair pad alarm on. All needs met.      Therapy Documentation Precautions:  Precautions Precautions: Fall Precaution Comments: delayed processing, Increased tone UB and LB Required Braces or Orthoses: Other Brace Other Brace: B adjustable night splints; B elbow "cosey" splints; L palm protector; R  mitt Restrictions Weight Bearing Restrictions: No General:     Therapy/Group: Individual Therapy  Orrin Brigham 04/10/2023, 7:43 AM

## 2023-04-10 NOTE — Progress Notes (Signed)
Occupational Therapy Session Note  Patient Details  Name: Nicole [name] MRN: [id number] Date of Birth: [birth year]  Today's Date: 04/10/2023 OT Individual Time: 9629-5284 OT Individual Time Calculation (min): 75 min    Short Term Goals: Week 9: Week 1:  OT Short Term Goal 1 (Week 1): Week 19:  Patient will stand at sink with min assist long enough for helper to pull up pants OT Short Term Goal 1 - Progress (Week 1): Progressing toward goal OT Short Term Goal 2 (Week 1): Week 19:  Patient will choose clothing items from drawer without cueing once placed in front of dresser OT Short Term Goal 2 - Progress (Week 1): Met OT Short Term Goal 3 (Week 1): Week 19:  Patient will don right sock on foot with min assist for trunk control OT Short Term Goal 3 - Progress (Week 1): Progressing toward goal OT Short Term Goal 4 (Week 1): Week 19:  Patient will utilize external cues to orient herself to time/ date  Skilled Therapeutic Interventions/Progress Updates:  Pt greeted supine in bed, pt agreeable to OT intervention.  Pt states, "play taylor swift, folklore."     Transfers/bed mobility: pt completed supine>sit with light CGA. Stand pivot to w/c from EOB with MODA and no AD. Pt completed stand pivot from w/c>toilet with use of grab bars and MODA, MIN verbal cues for set- up and body mechanics. Pt requested to walk from toilet>shower with [facility].     ADLs:  Grooming: pt completed oral care with set- up assist at sink. Provided assist with hair care with total  A for time mgmt.  UB dressing:pt donned [place] shirt with set- up assist.  LB dressing: pt dooned pants with MODA, donned L CAM boot and shoe with total A Footwear: pt able to don R sock with set- up assist!  Bathing: pt completed bathing from seated/standing with overall MAX A, emphasis on directing level of care and sitting back in seat as pt prefers to lean forward in seat. Lowered padded seat as pt unable to touch the ground from sitting  position.    Toileting: pt unable to void but did attempt b/b void from toilet.   General: added air in roho cushion to assist with positioning.   Ended session with pt seated in w/c with all needs within reach and chair alarm activated.                    Therapy Documentation Precautions:  Precautions Precautions: Fall Precaution Comments: delayed processing, Increased tone UB and LB Required Braces or Orthoses: Other Brace Other Brace: B adjustable night splints; B elbow "cosey" splints; L palm protector; R mitt Restrictions Weight Bearing Restrictions: No  Pain: Unrated pain reported at L heel during dressing change, rest breaks provided as needed.    Therapy/Group: Individual Therapy  Barron Schmid 04/10/2023, 12:14 PM

## 2023-04-10 NOTE — Progress Notes (Signed)
Speech Language Pathology Daily Session Note  Patient Details  Name: Nicole [name] MRN: [id number] Date of Birth: 1975/10/17  Today's Date: 04/10/2023 SLP Individual Time: 1345-1430 SLP Individual Time Calculation (min): 45 min  Short Term Goals: Week 9: SLP Short Term Goal 1 (Week 9): Week 19 - Pt will solve basic to mildly complex problems with 90% accuracy given min assist. SLP Short Term Goal 2 (Week 9): Week 19 -Patient will utilize external aids for orientation to place and time with supervision level verbal cues SLP Short Term Goal 3 (Week 9): Week 19 - Pt will demonstrate sustained attention to basic tasks for 15 minutes with Min A multimodal cues for redirection. SLP Short Term Goal 4 (Week 9): Week 19 - Pt will utilize increased vocal intensity w/ supervision verbal cues to remain 90% intelligibile at sentence level  Skilled Therapeutic Interventions: Skilled therapy session focused on cognitive goals. SLP facilitated session by targeting sustained attention during reading task. SLP verbalized paragraphs from chapter book and asked patient comprehension questions. Patient required minA to sustain attention to reading, occasionally falling asleep. SLP initiated completion of another task to increase patients attention. SLP provided modA during problem solving task encouraging patient to name Thanksgiving grocery list items from A-Z. During SLP session, nurse administered medications whole in puree. Patient with adequate oral clearance and no s/sx of aspiration. Recommend upgrade of medication administration and continuation of current diet. Patient left in chair with alarm set and call bell in reach. Continue POC.   Pain Back pain, repositioned  Therapy/Group: Individual Therapy  Rhanda Lemire M.A., CF-SLP 04/10/2023, 12:43 PM

## 2023-04-10 NOTE — Progress Notes (Signed)
Patient ID: Nicole [name], female   DOB: May 25, 1975, 47 y.o.   MRN: [id number] Continue to search for SNF bed, have reached out to [facility] to see if would do a LOG which facilities may offer with this. Zack Brooks-Assistant Director of TOC does not comfortable agreeing due to length of time may take for her SSD to be approved. Will contact Social Security to see if anything can be done to expedite the application. Spoke with Mom yesterday and she continues to report she can not provide the care pt needs she is mod assist wheelchair level.

## 2023-04-11 MED ORDER — AMANTADINE HCL 100 MG PO CAPS
100.0000 mg | ORAL_CAPSULE | Freq: Two times a day (BID) | ORAL | Status: DC
  Administered 2023-04-11 – 2023-05-04 (×46): 100 mg via ORAL
  Filled 2023-04-11 (×47): qty 1

## 2023-04-11 MED ORDER — ENSURE ENLIVE PO LIQD
237.0000 mL | Freq: Two times a day (BID) | ORAL | Status: DC
  Administered 2023-04-11 – 2023-04-16 (×7): 237 mL via ORAL

## 2023-04-11 MED ORDER — JUVEN PO PACK
1.0000 | PACK | Freq: Two times a day (BID) | ORAL | Status: DC
Start: 2023-04-11 — End: 2023-04-16
  Administered 2023-04-11 – 2023-04-14 (×4): 1 via ORAL
  Filled 2023-04-11 (×4): qty 1

## 2023-04-11 NOTE — Progress Notes (Signed)
Occupational Therapy Session Note  Patient Details  Name: Nicole [name] MRN: [id number] Date of Birth: 1975/10/01  Today's Date: 04/11/2023 OT Individual Time: 1027-2536 OT Individual Time Calculation (min): 71 min    Short Term Goals: Week 19:  OT Short Term Goal 1 (Week 1): Week 19:  Patient will stand at sink with min assist long enough for helper to pull up pants OT Short Term Goal 1 - Progress (Week 1): Progressing toward goal OT Short Term Goal 2 (Week 1): Week 19:  Patient will choose clothing items from drawer without cueing once placed in front of dresser OT Short Term Goal 2 - Progress (Week 1): Met OT Short Term Goal 3 (Week 1): Week 19:  Patient will don right sock on foot with min assist for trunk control OT Short Term Goal 3 - Progress (Week 1): Progressing toward goal OT Short Term Goal 4 (Week 1): Week 19:  Patient will utilize external cues to orient herself to time/ date  Skilled Therapeutic Interventions/Progress Updates:   Patient received supine in bed.  Patient upset that her brief was wet.  When asked if she tried to alert the staff, she said she used call bell, however, on occasion patient has mistaken room phone for call bell.  Removed wet brief and assisted patient to chair for shower transfer.  Patient able to use grab bar, and complete a stand step transfer to shower seat with verbal cueing and contact guard.  Patient able to wash hair once shampoo applied to hand using BUE!  Patient much more thorough and organized in her approach to bathing.  Able to dress upper body after set up, needing assist to don pants/ socks/ boot over left foot.  Patient limited with tight hip - worked on approaching figure four position to address hip range.  Patient with poor postural control, falling to left in sitting while attempting to don shirt.  Patient needing cues and physical assist to right herself back to midline - over focused on task, almost to the point of falling.   Patient  set up to eat breakfast, and fed herself without adaptive equipment.  Patient did not like omelette and called down to kitchen to order herself grits and coffee with min initial cueing.  Left up in wheelchair with safety belt in place and engaged and personal items/ call bell in reach.    Therapy Documentation Precautions:  Precautions Precautions: Fall Precaution Comments: delayed processing, Increased tone UB and LB Required Braces or Orthoses: Other Brace Other Brace: B adjustable night splints; B elbow "cosey" splints; L palm protector; R mitt Restrictions Weight Bearing Restrictions: No Pain: Pain Assessment Pain Scale: 0-10 Pain Score: 0-No pain Denies pain unless standing - then pain in left heel    Therapy/Group: Individual Therapy  [name] 04/11/2023, 10:52 AM

## 2023-04-11 NOTE — Progress Notes (Signed)
Recreational Therapy Session Note  Patient Details  Name: HANIYAH HARLOS MRN: [id number] Date of Birth: 1975/07/25 Today's Date: 04/11/2023  Pt participated in animal assisted activity seated w/c level.  Pt easily engaged with pet partner team.  Pt appreciative of this visit.   Lynze Reddy 04/11/2023, 3:14 PM

## 2023-04-11 NOTE — Patient Care Conference (Signed)
Inpatient RehabilitationTeam Conference and Plan of Care Update Date: 04/11/2023   Time: 11:02 AM    Patient Name: Nicole [name]      Medical Record Number: [id number]  Date of Birth: [birth year] Sex: Female         Room/Bed: 4W09C/4W09C-01 Payor Info: Payor: [employer] / Plan: [employer] / Product Type: *No Product type* /    Admit Date/Time:  11/18/2022  3:15 PM  Primary Diagnosis:  Hypoxic brain injury [facility] [place])  Hospital Problems: Principal Problem:   Hypoxic brain injury (HCC) Active Problems:   Protein-calorie malnutrition, severe   Essential hypertension   Pressure injury of skin    Expected Discharge Date: Expected Discharge Date:  (SNF pending)  Team Members Present: Physician leading conference: Dr. Fanny Dance Social Worker Present: Dossie Der, LCSW Nurse Present: Chana Bode, RN PT Present: Casimiro Needle, PT OT Present: Bretta Bang, OT SLP Present: Pablo Lawrence, SLP PPS Coordinator present : Fae Pippin, SLP     Current Status/Progress Goal Weekly Team Focus  Bowel/Bladder   continent with urgency; LBM: 11/26   time toileting to avoid incontiences due to urgency   assist with toileting prn    Swallow/Nutrition/ Hydration   D3/thin   goal met  goal met    ADL's   Contact guard trasnfers, mod assist lower body/ set up upper body dressing and bathing.  Patient showing improved particiaption and organization with all aspects of BADL/ functional mobility   Goals min overall   postural control for BADL, BUE AROM    Mobility   min/modA bed mobility, minA sit<>stands, min/modA stand pivot transfers with B HHA, modA gait 67ft, maxA x4 stairs.   Goals upgraded to MIn A  Postural control, gait training, functional transfers, stretching    Communication   decreased vocal intensity, improves w/ verbal cues   mod A at the sentence/conversational level   progress towards improving intelligibility in variety of settings/situations     Safety/Cognition/ Behavioral Observations  atleast modA for temporal orientation, attention, basic problem solving   mod A   continue to address orientation to time, mildly complex problem solving, sustained attention, carryover of writing strategies    Pain   c/o pain R foot, back and/or headache; PRN tramadol   pain level <4/10   assess pain QS and prn    Skin   presure injury R heel; MASD to buttock   remain free of skin breakdown/infection  assess skin QS and prn      Discharge Planning:  Continue to search for SNF bed, may need to wait for disability to get approved so will have payment for facility. Mom states can not take care of her.   Team Discussion: Patient post hypoxic brain injury.  Continue to note decreased vocal intensity however continues to demonstrate functional improvements.  Limited by urinary symptoms; abx pending culture results.    Patient on target to meet rehab goals: yes, currently needs min - mod assist for sit - stand transfers and stand pivots with bilateral HHA.  Able to ambulate up to 50[' using adaptive equipment.  Needs mod assist for attention and basic problem solving.  *See Care Plan and progress notes for long and short-term goals.   Revisions to Treatment Plan:    Teaching Needs: Safety, medications, skin care, transfers, toileting, etc.   Current Barriers to Discharge: Inaccessible home environment, Incontinence, and Lack of/limited family support  Possible Resolutions to Barriers: Family education SNF recommended Disability application pending  Medical Summary Current Status: pt with ongoing spasticity. bp's still low which has limited meds. +UTI  Barriers to Discharge: Medical stability   Possible Resolutions to Barriers/Weekly Focus: advancing anti-spadmodic meds. pushing po liquids, empiric amoxil as we await culture results   Continued Need for Acute Rehabilitation Level of Care: The patient requires daily medical  management by a physician with specialized training in physical medicine and rehabilitation for the following reasons: Direction of a multidisciplinary physical rehabilitation program to maximize functional independence : Yes Medical management of patient stability for increased activity during participation in an intensive rehabilitation regime.: Yes Analysis of laboratory values and/or radiology reports with any subsequent need for medication adjustment and/or medical intervention. : Yes   I attest that I was present, lead the team conference, and concur with the assessment and plan of the team.   Chana Bode B 04/11/2023, 2:00 PM

## 2023-04-11 NOTE — Progress Notes (Signed)
Nutrition Follow-up  DOCUMENTATION CODES:   Not applicable  INTERVENTION:   - Continue sending double protein portions with meals - Continue MVI/Minerals daily, vitamin C 500 mg daily to support wound healing.  - Provide Ensure Enlive BID, each supplement provides 350 kcal and 20 grams of protein.' - Provide 1 packet Juven BID, each packet provides 95 calories, 2.5 grams of protein (collagen)  NUTRITION DIAGNOSIS:   Increased nutrient needs related to acute illness, other (see comment), wound healing (prolonged hospitalization, previously severe malnutrition) as evidenced by estimated needs.  - ongoing but being addressed with oral nutritional supplements   GOAL:   Patient will meet greater than or equal to 90% of their needs  - progressing   MONITOR:   PO intake, Supplement acceptance  REASON FOR ASSESSMENT:   Malnutrition Screening Tool    ASSESSMENT:   47 y.o. 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 PMH includes: anxiety, asthma, depression.  Pt endorses  good appetite and PO intake. Pt ate 100% of her breakfast today. No noted nausea/vomiting and pt reports regular bowel movements. Pt really wanted Ensure but it was D/C. Will add Ensures between meals for her. Last weight was 11/11, RN to get updated weight.   Admit weight: 47.4 kg  Current weight: 51.8 kg    Average Meal Intake: 11/24-11/26: 75% intake x 8 recorded meals  Nutritionally Relevant Medications: Scheduled Meds:  ascorbic acid  500 mg Oral Daily   cholecalciferol  5,000 Units Oral Daily   l-methylfolate-B6-B12  1 tablet Oral BID   magnesium gluconate  500 mg Oral QHS   multivitamin with minerals  1 tablet Oral Daily   polyethylene glycol  17 g Oral Daily   Labs Reviewed: Last BMP was 11/20 - Normal lab values   Diet Order:   Diet Order             DIET DYS 3 Room service appropriate? Yes with  Assist; Fluid consistency: Thin  Diet effective now                   EDUCATION NEEDS:   Not appropriate for education at this time  Skin:  Skin Assessment: Skin Integrity Issues: Skin Integrity Issues:: Stage III Stage III: Left heel Unstageable: Left buttocks Other: abrasion to buttocks, blister to right and left heel, ecchymosis to left and right abdomen, erythema to bilateral buttock  Last BM:  04/10/23  Height:   Ht Readings from Last 1 Encounters:  11/18/22 5\' 1"  (1.549 m)    Weight:   Wt Readings from Last 1 Encounters:  03/26/23 51.8 kg    Ideal Body Weight:  47.7 kg  BMI:  Body mass index is 21.58 kg/m.  Estimated Nutritional Needs:   Kcal:  1600-1800  Protein:  90-110 gm  Fluid:  >/= 1.6 L   Elliot Dally, RD Registered Dietitian  See Amion for more information

## 2023-04-11 NOTE — Progress Notes (Signed)
Physical Therapy Session Note  Patient Details  Name: Nicole [name] MRN: [id number] Date of Birth: [birth year]  Today's Date: 04/11/2023 PT Individual Time: 1100-1205 PT Individual Time Calculation (min): 65 min   Short Term Goals: Week 1:  PT Short Term Goal 1 (Week 1): Pt will ambulate 27ft with modA and LRAD PT Short Term Goal 1 - Progress (Week 1): Not met PT Short Term Goal 2 (Week 1): Pt will complete functional outcome measure to assess balance and falls risk PT Short Term Goal 2 - Progress (Week 1): Not met PT Short Term Goal 3 (Week 1): Week 13: Pt will complete bed<>chair transfers with totalA (pt <25%) PT Short Term Goal 3 - Progress (Week 1): Not met  Skilled Therapeutic Interventions/Progress Updates:  Patient seated upright in w/c on entrance to room. Patient alert and agreeable to PT session.   Patient with no pain complaint at start of session. CAM boot donned to LLE.  Therapeutic Activity: Transfers: Pt performed sit<>stand and stand pivot transfers throughout session with CGA/ MinA for balance upon stance. Provided verbal cues for reducing impulsivity and for hand placement/ progression for increased safety.  Gait Training:  Pt ambulated 55' x1/ 30' x1/ 45' x1 using RW with CGA/ MinA. Demonstrated ability to maintain upright posture with stance to RLE, but flexes forward with WB to LLE throughout. Provided vc/ tc for attempt to maintain upright posture. Intermittent MinA for managing RW  Neuromuscular Re-ed: NMR facilitated during session with focus on standing balance and tolerance. Pt guided in standing drawing task at mirror using dry erase markers. Pt stands for 2 bouts at and . VC provided for improving upright posture throughout. NMR performed for improvements in motor control and coordination, balance, sequencing, judgement, and self confidence/ efficacy in performing all aspects of mobility at highest level of independence.   Performed stand pivot to bed  with MinA and then guided in technique to come to L sidelying prior to roll to back d/t attempt to return to supine but pt just sitting back from seat on EOB. Requires MinA and vc/ tc for technique.   Upon reaching supine, pt relates need to urinate and then relates incontinence. [name] pants doffed and brief changed with modA with pt able to bridge and assist with removing old and then positioning new brief. Pt able to provide self care with washcloth and supervision.   Patient supine at end of session with brakes locked, bed alarm set, and all needs within reach.   Therapy Documentation Precautions:  Precautions Precautions: Fall Precaution Comments: delayed processing, Increased tone UB and LB Required Braces or Orthoses: Other Brace Other Brace: B adjustable night splints; B elbow "cosey" splints; L palm protector; R mitt Restrictions Weight Bearing Restrictions: No  Pain:  Minimal pain related but at end of session does relate some low back soreness. Addressed with k-pad to low back in bed.    Therapy/Group: Individual Therapy  Loel Dubonnet PT, DPT, CSRS 04/11/2023, 8:37 AM

## 2023-04-11 NOTE — Progress Notes (Signed)
PROGRESS NOTE   Subjective/Complaints:  Bladder seems to be settling down. Odor better. No real change in spasticity.   ROS: Patient denies fever, rash, sore throat, blurred vision, dizziness, nausea, vomiting, diarrhea, cough, shortness of breath or chest pain, joint or back/neck pain, headache, or mood change.     Objective:   No results found. No results for input(s): "WBC", "HGB", "HCT", "PLT" in the last 72 hours.      No results for input(s): "NA", "K", "CL", "CO2", "GLUCOSE", "BUN", "CREATININE", "CALCIUM" in the last 72 hours.       Intake/Output Summary (Last 24 hours) at 04/11/2023 0837 Last data filed at 04/10/2023 1824 Gross per 24 hour  Intake 474 ml  Output --  Net 474 ml       Pressure Injury 02/09/23 Heel Left Stage 3 -  Full thickness tissue loss. Subcutaneous fat may be visible but bone, tendon or muscle are NOT exposed. (Active)  02/09/23 0543  Location: Heel  Location Orientation: Left  Staging: Stage 3 -  Full thickness tissue loss. Subcutaneous fat may be visible but bone, tendon or muscle are NOT exposed.  Wound Description (Comments):   Present on Admission: No    Physical Exam: Vital Signs Blood pressure (!) 91/52, pulse 74, temperature 98.4 F (36.9 C), resp. rate 17, height 5\' 1"  (1.549 m), weight 51.8 kg, SpO2 98%.   General: No acute distress HEENT: NCAT, EOMI, oral membranes moist Cards: reg rate  Chest: normal effort Abdomen: Soft, NT, ND Skin: dry, intact Extremities: no edema Psych: pleasant and appropriate  Skin: + Scaling dried skin on bilateral heels left buttock unstageable - not examined + Left heel stage III DTI, covered in foam dressing  Neurologic: AAOx2,  to year but not month or date. Follows most simple commands.  + Hypophonation, speech intelligability much improved MAS 1 right elbow,MAS  tr to 1 in RIght finger and wrist flexors  MAS 2-3 in  extensors of LE's MAS 1 L elbow , MAS 1-2 in L wrist and finger flexors. Hypersensitivity to left hand improved, +left hand contracture, Left sided strength improved, decreased range of motion and pain at end range of motion in left shoulder--stable neuro exam  Musculoskeletal: reduced bilateral shoulder and elbow ROM due to increased tone ,pain with palpation of right shoulder    Assessment/Plan: 1. Functional deficits which require 3+ hours per day of interdisciplinary therapy in a comprehensive inpatient rehab setting. Physiatrist is providing close team supervision and 24 hour management of active medical problems listed below. Physiatrist and rehab team continue to assess barriers to discharge/monitor patient progress toward functional and medical goals  Care Tool:  Bathing    Body parts bathed by patient: Face, Abdomen, Chest, Left upper leg, Right upper leg, Left arm, Front perineal area, Right arm, Right lower leg   Body parts bathed by helper: Buttocks, Left lower leg Body parts n/a: Right arm, Left arm, Front perineal area, Buttocks, Right upper leg, Left upper leg, Right lower leg, Left lower leg   Bathing assist Assist Level: Minimal Assistance - Patient > 75%     Upper Body Dressing/Undressing Upper body dressing   What is the  patient wearing?: Pull over shirt    Upper body assist Assist Level: Set up assist    Lower Body Dressing/Undressing Lower body dressing      What is the patient wearing?: Incontinence brief, Pants     Lower body assist Assist for lower body dressing: Moderate Assistance - Patient 50 - 74%     Toileting Toileting Toileting Activity did not occur Press photographer and hygiene only): N/A (no void or bm)  Toileting assist Assist for toileting: Maximal Assistance - Patient 25 - 49%     Transfers Chair/bed transfer  Transfers assist  Chair/bed transfer activity did not occur: Safety/medical concerns  Chair/bed transfer assist level:  Moderate Assistance - Patient 50 - 74%     Locomotion Ambulation   Ambulation assist   Ambulation activity did not occur: Safety/medical concerns  Assist level: Moderate Assistance - Patient 50 - 74% Assistive device: Walker-rolling Max distance: 64ft   Walk 10 feet activity   Assist  Walk 10 feet activity did not occur: Safety/medical concerns  Assist level: Moderate Assistance - Patient - 50 - 74% Assistive device: Walker-rolling   Walk 50 feet activity   Assist Walk 50 feet with 2 turns activity did not occur: Safety/medical concerns         Walk 150 feet activity   Assist Walk 150 feet activity did not occur: Safety/medical concerns         Walk 10 feet on uneven surface  activity   Assist Walk 10 feet on uneven surfaces activity did not occur: Safety/medical concerns         Wheelchair     Assist Is the patient using a wheelchair?: Yes Type of Wheelchair: Manual    Wheelchair assist level: Dependent - Patient 0% Max wheelchair distance: 79ft    Wheelchair 50 feet with 2 turns activity    Assist    Wheelchair 50 feet with 2 turns activity did not occur: Safety/medical concerns   Assist Level: Dependent - Patient 0%   Wheelchair 150 feet activity     Assist  Wheelchair 150 feet activity did not occur: Safety/medical concerns   Assist Level: Dependent - Patient 0%   Blood pressure (!) 91/52, pulse 74, temperature 98.4 F (36.9 C), resp. rate 17, height 5\' 1"  (1.549 m), weight 51.8 kg, SpO2 98%.  Medical Problem List and Plan: 1. 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  Awaiting approval of disability. SW has been working tirelessly with TOC/others on placement. Disability  remains sticking point Code status  DNR -Continue CIR therapies including PT, OT, and SLP    2.  Impaired mobility: continue Lovenox 30mg  daily, weekly creatinine ordered             -antiplatelet therapy: N/A  3. Pain:  -Knee pain: Tylenol as needed, continue voltaren gel scheduled. XR reviewed and is stable, discussed with therapy -Right wrist swelling: XR ordered and shows no acute fracture, shows 2.50mm ulnar variance, brace removed to given patient a break from it. Voltaren gel ordered prn for discomfort, continue, CT ordered and show no acute osseus injury, continue  ace wrap   -Foot pain: tramadol ordered prn, improved today, continue prn  -Headaches: continue tylenol and tramadol prn -Low back pain: continue kpad, asked [place] to please bring for her -Whole body hypersensitivity: gabapentin 100mg  TID ordered -Pain in left heel: XR obtained and negative, cam boot ordered to help offload heel with therapy, discussed that this has helped -R shoulder pain: XR neg for acute etiology on 04/06/23, voltaren gel ordered--encourage ROM as tolerated  4. History of anxiety: discussed with her mom that she feels this was a big risk factor for her accident, propanolol d/ced due to hypotension. Continue magnesium supplement  5. Neuropsych/cognition: This patient is not capable of making decisions on her own behalf  - 10/12: Still not capable of medical decision making; BUT oriented and communicating, following directions - MUCH improved - PRN melatonin 3mg    for insomnia  6. Skin: -Buttock/sacrum--foam dressing, pressure relief, nutrition -stage 3 left heel, continue foam dressing/ pressure relief  7. Fluids/Electrolytes/Nutrition: Routine in and outs with follow-up chemistries  -Eating  well with cueing   -Dysphagia: continue D3/thins--tolerating this diet -Low protein stores: d/c  prosource since patient hates these and is eating 100% of meals -Suboptimal vitamin D level: increase to 5,000 U daily. continue this dose  -History of magnesium deficiency: magnesium 500mg  started HS  8.  Cavitary right lower lobe pneumonia likely aspiration pneumonia/MRSA pneumonia.  Resolved    Latest Ref Rng & Units 04/04/2023    6:09 AM 02/05/2023   11:12 AM 01/22/2023    6:36 AM  CBC  WBC 4.0 - 10.5 K/uL 5.6  8.5  8.1   Hemoglobin 12.0 - 15.0 g/dL 16.1  09.6  04.5   Hematocrit 36.0 - 46.0 % 42.1  46.7  47.5   Platelets 150 - 400 K/uL 224  284  274      9.  History of drug abuse.  Positive cocaine on urine drug screen.  Provide counseling  10.  AKI/hypovolemia and ATN.  Resolved Appears to have fair fluid intake, creatinine has been stable  11/3- Push fluids due ot low BP  -04/07/23 Cr stable 04/04/23--recheck next week,      Latest Ref Rng & Units 04/04/2023    6:09 AM 04/02/2023    3:39 PM 03/30/2023    5:35 AM  BMP  Glucose 70 - 99 mg/dL 91  85    BUN 6 - 20 mg/dL 20  17    Creatinine 4.09 - 1.00 mg/dL 8.11  9.14  7.82   Sodium 135 - 145 mmol/L 138  136    Potassium 3.5 - 5.1 mmol/L 3.9  4.1    Chloride 98 - 111 mmol/L 105  100    CO2 22 - 32 mmol/L 24  26    Calcium 8.9 - 10.3 mg/dL 9.3  8.8      11.  Mild transaminitis with rhabdomyolysis.  Both resolved  12.  Hypotension. Resolved, d/c midodrine -d/c propanolol, decrease baclofen to 10mg  HS>> back up to 20mg  QHS, decrease tizanidine to 2mg  TID 11/2- BP was running 87 systolic last night- and max of 110 in last 24 hours- not dizzy per chart, but if develops any dizziness, suggest primary team restarts Midodrine. Amantadine can cause hypotension, FYI Decrease tizanidine to BID  -04/11/23 bp's still soft-no change with TID tizanidine   -dc magnesium Vitals:   04/06/23 2046 04/07/23 0603 04/07/23 1315 04/07/23 1931  BP: 96/68 99/65 94/67  (!) 83/50   04/08/23 0447 04/08/23 1339 04/08/23 1959  04/09/23 0543  BP:  98/70 (!) 91/56 (!) 90/55 91/60   04/09/23 2053 04/10/23 0452 04/10/23 2014 04/11/23 0450  BP: (!) 88/63 98/71 (!) 87/53 (!) 91/52   13. HTN: see above Increase tizanidine to 6mg  TID--now 4mg  TID>>2mg  TID.    - 14. Impaired initiation: continue amantadine 100mg  BID  15. Hyperglycemia: CBGs mildly elevated, d/ced checks. Prosource d/ced as per patient's request  16. Spasticity:  -discussed serial casting with OT and her mom but she defers given pain she had when this was attempted -baclofen and tizanidine decreased due to hypotension, spasticity has been stable. Continue wrist, elbow brace, and PRAFOs. Ordered a night splint for LLE  Repeat CMP today given chronic tizanidine use -9/7 continues to have significant tone despite use of tizanidine and baclofen, may be beneficial consider baclofen pump in outpatient setting if this causes significant difficulties in care  11/26 increased tizanidine to 2mg  tid, consider increase to 4mg  if bp holds  17.  Dyskinetic movements in head and neck - appears to be myoclonic jerks related to hypoxic brain injury - as discussed with pt mom, no specific meds for this on antispasticity meds , consioder benzo although with pt's SUD would avoid this class - EEG reviewed and was negative for seizure.  Baclofen decreased to 20mg  HS  18. Cervical extensor weakness: tizanidine decreased to 2mg  BID  19. Bowel and bladder incontinence: continue bowel and bladder program. Conitnue magneisum supplement  20. Fatigue: improved, discussed with team may be secondary to anti-spasticity medications, B12 reviewed and is 335, in suboptimal range, will give 1,000U B12 injection 8/7, will order monthly as needed, metanx started, increase to BID, continue, decrease tizanidine to BID, continue this dose  21. MRSA nares positive: negative on repeat, precautions d/ced  22. Tachycardic: resolved, d/c propanolol.  Continue Tizanidine increased to 6mg  TID>> now down to 2mg  TID.  Resolved, amantadine increased back to 100mg  BID, continue this dose, resolved  23. S/p fall: CT head reviewed and is stable  24. Bilateral foot drop: bilateral AFOs ordered, discussed that these can help her to walk better  25. Vaginal itching: completed fluconazole. Having some discharge now. See below  26. Urinary frequency/odor: UA+  -11/26 began empiric amoxil  -11/27 UCX STILL pending but sx improving with amoxil       LOS: 144 days A FACE TO FACE EVALUATION WAS PERFORMED  Ranelle Oyster 04/11/2023, 8:37 AM

## 2023-04-11 NOTE — Progress Notes (Signed)
Physical Therapy Session Note  Patient Details  Name: Nicole [name] MRN: [id number] Date of Birth: 1976/02/18  Today's Date: 04/11/2023 PT Individual Time: 1660-6301 PT Individual Time Calculation (min): 72 min   Short Term Goals: Week 1:  PT Short Term Goal 1 (Week 1): Pt will ambulate 14ft with modA and LRAD PT Short Term Goal 1 - Progress (Week 1): Not met PT Short Term Goal 2 (Week 1): Pt will complete functional outcome measure to assess balance and falls risk PT Short Term Goal 2 - Progress (Week 1): Not met PT Short Term Goal 3 (Week 1): Week 13: Pt will complete bed<>chair transfers with totalA (pt <25%) PT Short Term Goal 3 - Progress (Week 1): Not met  Skilled Therapeutic Interventions/Progress Updates:    Pt received sitting in w/c awake, looking at a book, and agreeable to therapy session.  Transported to/from gym in w/c for time management and energy conservation.  Donned and wearing CAM boot on L LE throughout session.  Stand pivot transfers w/c<>EOM using B HHA with min A for powering into stand and balance while stepping - pt achieves adequate upright posture, but tends to crouch more during L stance, able to take steps with both LEs. When performing stand pivots using RW, notice pt then focuses more on trying to pivot hips and sit in chair more quickly, rather than taking time to ensure she has turned fully.  Sitting EOM performed pelvic lateral tilts/raises with task of lifting hip to place bean bag underneath, noticed pt compensating by pushing through LEs and lifting both hips so elevated mat to take away foot support on floor - pt with some improvement in motor planning, but still lacking full activation of abdominal lateral flexors to lift hip completely.  Transitioned to sitting on theraball via stand pivot to promote increased pelvic tilt activation. Provided mirror feedback for improved motor planning/sequencing of tilting pelvis side to side laterally. Requires  varying light to heavy min A for sitting balance and ball stability throughout this exercise. Pt reporting increased low back pain with this activity; therefore, transitioned into wheelchair for back support.  Pt unable to receive additional pain medication at this time and instead requests to perform supine stretches for pain management.  Sit>supine on mat with min A primarily for L LE management.   Supine stretching as follows with assistance:  -R and L single knee to chest with pt having increased pain moving L LE with pt reporting quad pain (despite therapist not performing full knee flexion) - anticipate this is due to increased tone in L LE compared to R - hooklying lower body knee rotations with manual facilitation to only perform through available range (pt with some pain rotating knees towards R with stretch on L side) - performed L UE elbow stretch while resting in supine with L cervical rotation stretch   Supine>sitting L EOM with min A for L LE management and trunk upright.  Transported back to room.   Gait training ~21ft x2 in/out bathroom using youth RW with min progressed towards mod A at end when ambulating back to the bed due to fatigue -- demos reciprocal stepping pattern although short steps, slight crouched posture but improved significantly since last time seen by this therapist, heavy reliance on B UE support on AD, and slow gait speed. Requires heavy manual facilitation for directing AD with some improvement when ambulating out of the bathroom.  Standing with min A for balance during dependent LB clothing management.  Continent of bladder and performed standing peri-care set-up assist with min A for balance.   Standing at sink (stopped during gait back to EOB) with heavy min A/light mod A at this time due to pt having worsening crouch in static standing when forced to have decreased UE support.  Sit>supine with light min A for L LE management primarily. Pt left supine in bed  with needs in reach and bed alarm on.  Therapy Documentation Precautions:  Precautions Precautions: Fall Precaution Comments: delayed processing, Increased tone UB and LB Required Braces or Orthoses: Other Brace Other Brace: B adjustable night splints; B elbow "cosey" splints; L palm protector; R mitt Restrictions Weight Bearing Restrictions: No   Pain: Reports low back pain rated as 5/10, performed supine stretches for pain management due to pt already being premedicated.     Therapy/Group: Individual Therapy  Ginny Forth , PT, DPT, NCS, CSRS 04/11/2023, 11:03 AM

## 2023-04-11 NOTE — Progress Notes (Signed)
Physical Therapy Session Note  Patient Details  Name: Nicole [name] MRN: [id number] Date of Birth: August 04, 1975  Today's Date: 04/11/2023 PT Individual Time: 1045-1100 PT Individual Time Calculation (min): 15 min   Short Term Goals: Week 8: PT Short Term Goal 1 (Week 8): Pt will initiate bed mobility requiring no more than totalA. PT Short Term Goal 2 (Week 8): Pt will maintain sitting balance unsupported for up to 45 seconds PT Short Term Goal 3 (Week 8): Pt will demonstrate command follow for >10% of tasks. PT Short Term Goal 4 (Week 8): [name] RLE will demonstrate improved joint mobility into knee/ hip extension and ankle DF to neutral. Week 9: PT Short Term Goal 1 (Week 9): Pt will initiate bed mobility with totalA PT Short Term Goal 2 (Week 9): Pt will maintain unsupported sitting balance for 30 seconds without LOB PT Short Term Goal 3 (Week 9): Pt will complete bed<>chair transfers with no more than totalA  Skilled Therapeutic Interventions/Progress Updates:   Received pt sitting in [facility] and agreeable to unplanned PT treatment. Pt performed multiple sit<>stands with RW and min A throughout session (demo posterior lean, requiring cues to correct). Pt performed standing marches 2x5 bilaterally - pt with 1 posterior LOB into WC, standing hip abduction x10 bilaterally, seated LAQ 2x10 bilaterally with 2.5lb ankle weight on RLE and unweighted on LLE, and seated hip flexion 2x10 bilaterally with 2.5lb ankle weight on RLE and unweighted on LLE. Worked on blocked practice sit<>stands with RW 2x5 with emphasis on technique/hand placement. Concluded session with pt sitting in WC, needs within reach, and chair pad alarm on awaiting upcoming PT session.   Therapy Documentation Precautions:  Precautions Precautions: Fall Precaution Comments: delayed processing, Increased tone UB and LB Required Braces or Orthoses: Other Brace Other Brace: B adjustable night splints; B elbow "cosey" splints; L palm  protector; R mitt Restrictions Weight Bearing Restrictions: No  Therapy/Group: Individual Therapy Marlana Salvage Zaunegger Blima Rich PT, DPT 04/11/2023, 10:44 AM

## 2023-04-12 LAB — URINE CULTURE: Culture: >=100000 — AB

## 2023-04-12 NOTE — Progress Notes (Signed)
Slept good. Refused all splints. Called to use bedpan at 2000, otherwise incontinent. Nicole [name]

## 2023-04-12 NOTE — Progress Notes (Signed)
PROGRESS NOTE   Subjective/Complaints:  No new problems overnight. Slept well. Incontinent still of urine  ROS: Limited due to cognitive/behavioral     Objective:   No results found. No results for input(s): "WBC", "HGB", "HCT", "PLT" in the last 72 hours.      No results for input(s): "NA", "K", "CL", "CO2", "GLUCOSE", "BUN", "CREATININE", "CALCIUM" in the last 72 hours.       Intake/Output Summary (Last 24 hours) at 04/12/2023 0929 Last data filed at 04/12/2023 0700 Gross per 24 hour  Intake 997 ml  Output --  Net 997 ml       Pressure Injury 02/09/23 Heel Left Stage 3 -  Full thickness tissue loss. Subcutaneous fat may be visible but bone, tendon or muscle are NOT exposed. (Active)  02/09/23 0543  Location: Heel  Location Orientation: Left  Staging: Stage 3 -  Full thickness tissue loss. Subcutaneous fat may be visible but bone, tendon or muscle are NOT exposed.  Wound Description (Comments):   Present on Admission: No    Physical Exam: Vital Signs Blood pressure 95/60, pulse 72, temperature 97.8 F (36.6 C), temperature source Oral, resp. rate 16, height 5\' 1"  (1.549 m), weight 51.8 kg, SpO2 99%.   Constitutional: No distress . Vital signs reviewed. HEENT: NCAT, EOMI, oral membranes moist Neck: supple Cardiovascular: RRR without murmur. No JVD    Respiratory/Chest: CTA Bilaterally without wheezes or rales. Normal effort    GI/Abdomen: BS +, non-tender, non-distended Ext: no clubbing, cyanosis, or edema Psych: pleasant and cooperative  Skin: + Scaling dried skin on bilateral heels left buttock unstageable - not examined + Left heel stage III DTI, covered in foam dressing  Neurologic: AAOx2,  to year but not month or date. Follows most simple commands.  + Hypophonation, speech intelligability much improved MAS 1 right elbow,MAS  tr to 1 in RIght finger and wrist flexors  MAS 2-3 in extensors  of LE's MAS 1 L elbow , MAS 1-2 in L wrist and finger flexors. Hypersensitivity to left hand improved, +left hand contracture, Left sided strength improved, decreased range of motion and pain at end range of motion in left shoulder--stable neuro exam  Musculoskeletal: limited bilateral shoulder and elbow ROM due to increased tone ,pain with palpation of right shoulder    Assessment/Plan: 1. Functional deficits which require 3+ hours per day of interdisciplinary therapy in a comprehensive inpatient rehab setting. Physiatrist is providing close team supervision and 24 hour management of active medical problems listed below. Physiatrist and rehab team continue to assess barriers to discharge/monitor patient progress toward functional and medical goals  Care Tool:  Bathing    Body parts bathed by patient: Face, Abdomen, Chest, Left upper leg, Right upper leg, Left arm, Front perineal area, Right arm, Right lower leg   Body parts bathed by helper: Buttocks, Left lower leg Body parts n/a: Right arm, Left arm, Front perineal area, Buttocks, Right upper leg, Left upper leg, Right lower leg, Left lower leg   Bathing assist Assist Level: Minimal Assistance - Patient > 75%     Upper Body Dressing/Undressing Upper body dressing   What is the patient wearing?: Pull over shirt  Upper body assist Assist Level: Set up assist    Lower Body Dressing/Undressing Lower body dressing      What is the patient wearing?: Incontinence brief, Pants     Lower body assist Assist for lower body dressing: Moderate Assistance - Patient 50 - 74%     Toileting Toileting Toileting Activity did not occur Press photographer and hygiene only): N/A (no void or bm)  Toileting assist Assist for toileting: Maximal Assistance - Patient 25 - 49%     Transfers Chair/bed transfer  Transfers assist  Chair/bed transfer activity did not occur: Safety/medical concerns  Chair/bed transfer assist level: Moderate  Assistance - Patient 50 - 74%     Locomotion Ambulation   Ambulation assist   Ambulation activity did not occur: Safety/medical concerns  Assist level: Moderate Assistance - Patient 50 - 74% Assistive device: Walker-rolling Max distance: 15ft   Walk 10 feet activity   Assist  Walk 10 feet activity did not occur: Safety/medical concerns  Assist level: Moderate Assistance - Patient - 50 - 74% Assistive device: Walker-rolling   Walk 50 feet activity   Assist Walk 50 feet with 2 turns activity did not occur: Safety/medical concerns         Walk 150 feet activity   Assist Walk 150 feet activity did not occur: Safety/medical concerns         Walk 10 feet on uneven surface  activity   Assist Walk 10 feet on uneven surfaces activity did not occur: Safety/medical concerns         Wheelchair     Assist Is the patient using a wheelchair?: Yes Type of Wheelchair: Manual    Wheelchair assist level: Dependent - Patient 0% Max wheelchair distance: 35ft    Wheelchair 50 feet with 2 turns activity    Assist    Wheelchair 50 feet with 2 turns activity did not occur: Safety/medical concerns   Assist Level: Dependent - Patient 0%   Wheelchair 150 feet activity     Assist  Wheelchair 150 feet activity did not occur: Safety/medical concerns   Assist Level: Dependent - Patient 0%   Blood pressure 95/60, pulse 72, temperature 97.8 F (36.6 C), temperature source Oral, resp. rate 16, height 5\' 1"  (1.549 m), weight 51.8 kg, SpO2 99%.  Medical Problem List and Plan: 1. 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 Awaiting approval of disability. SW has been working tirelessly with TOC/others on placement. Disability  remains sticking point Code status  DNR -Continue CIR therapies including PT, OT, and SLP    2.  Impaired mobility: continue Lovenox 30mg  daily, weekly creatinine ordered             -antiplatelet therapy: N/A  3. Pain:  -Knee pain: Tylenol as needed, continue voltaren gel scheduled. XR reviewed and is stable, discussed with therapy -Right wrist swelling: XR ordered and shows no acute fracture, shows 2.44mm ulnar variance, brace removed to given patient a break from it. Voltaren gel ordered prn for discomfort, continue, CT ordered and show no acute osseus injury, continue ace wrap   -Foot pain:  tramadol ordered prn, improved today, continue prn  -Headaches: continue tylenol and tramadol prn -Low back pain: continue kpad, asked [place] to please bring for her -Whole body hypersensitivity: gabapentin 100mg  TID ordered -Pain in left heel: XR obtained and negative, cam boot ordered to help offload heel with therapy, discussed that this has helped -R shoulder pain: XR neg for acute etiology on 04/06/23, voltaren gel ordered--encourage ROM as tolerated  4. History of anxiety: discussed with her mom that she feels this was a big risk factor for her accident, propanolol d/ced due to hypotension. Continue magnesium supplement  5. Neuropsych/cognition: This patient is not capable of making decisions on her own behalf  - 10/12: Still not capable of medical decision making; BUT oriented and communicating, following directions - MUCH improved - PRN melatonin 3mg    for insomnia  6. Skin: -Buttock/sacrum--foam dressing, pressure relief, nutrition -stage 3 left heel, continue foam dressing/ pressure relief  7. Fluids/Electrolytes/Nutrition: Routine in and outs with follow-up chemistries  -Eating  well with cueing   -Dysphagia: continue D3/thins--tolerating this diet -Low protein stores: d/c  prosource since patient hates these and is eating 100% of meals -Suboptimal vitamin D level: increase to 5,000 U daily. continue this dose  -History of magnesium deficiency: magnesium 500mg  started HS  8.  Cavitary right lower lobe pneumonia likely aspiration pneumonia/MRSA pneumonia.  Resolved    Latest Ref Rng & Units 04/04/2023    6:09 AM 02/05/2023   11:12 AM 01/22/2023    6:36 AM  CBC  WBC 4.0 - 10.5 K/uL 5.6  8.5  8.1   Hemoglobin 12.0 - 15.0 g/dL 16.1  09.6  04.5   Hematocrit 36.0 - 46.0 % 42.1  46.7  47.5   Platelets 150 - 400 K/uL 224  284  274      9.  History of drug abuse.  Positive cocaine on urine drug screen.  Provide counseling  10.  AKI/hypovolemia and ATN.  Resolved Appears to have fair fluid intake, creatinine has been stable  11/3- Push fluids due ot low BP  -recheck labs Monday      Latest Ref Rng & Units 04/04/2023    6:09 AM 04/02/2023    3:39 PM 03/30/2023    5:35 AM  BMP  Glucose 70 - 99 mg/dL 91  85    BUN 6 - 20 mg/dL 20  17    Creatinine 4.09 - 1.00 mg/dL 8.11  9.14  7.82   Sodium 135 - 145 mmol/L 138  136    Potassium 3.5 - 5.1 mmol/L 3.9  4.1    Chloride 98 - 111 mmol/L 105  100    CO2 22 - 32 mmol/L 24  26    Calcium 8.9 - 10.3 mg/dL 9.3  8.8      11.  Mild transaminitis with rhabdomyolysis.  Both resolved  12.  Hypotension. Resolved, d/c midodrine -d/c propanolol, decrease baclofen to 10mg  HS>> back up to 20mg  QHS, decrease tizanidine to 2mg  TID 11/2- BP was running 87 systolic last night- and max of 110 in last 24 hours- not dizzy per chart, but if develops any dizziness, suggest primary team restarts Midodrine. Amantadine can cause hypotension, FYI Decrease tizanidine to BID  -04/11/23 bp's still soft-no change with TID tizanidine 11/28 observe after stopping magnesium Vitals:   04/08/23 0447 04/08/23 1339 04/08/23 1959 04/09/23 0543  BP: 98/70 (!) 91/56 (!) 90/55 91/60   04/09/23 2053 04/10/23 0452 04/10/23 2014 04/11/23 0450  BP: (!)  95/62  98/71 (!) 87/53 (!) 91/52   04/11/23 1355 04/11/23 1918 04/11/23 2128 04/12/23 0424  BP: 90/63 (!) 86/55 (!) 84/58 95/60   13. HTN: see above Increase tizanidine to 6mg  TID--now 4mg  TID>>2mg  TID.    - 14. Impaired initiation: continue amantadine 100mg  BID  15. Hyperglycemia: CBGs mildly elevated, d/ced checks. Prosource d/ced as per patient's request  16. Spasticity:  -discussed serial casting with OT and her mom but she defers given pain she had when this was attempted -baclofen and tizanidine decreased due to hypotension, spasticity has been stable. Continue wrist, elbow brace, and PRAFOs. Ordered a night splint for LLE  Repeat CMP today given chronic tizanidine use -9/7 continues to have significant tone despite use of tizanidine and baclofen, may be beneficial consider baclofen pump in outpatient setting if this causes significant difficulties in care  11/28 on tizanidine 2mg  tid, consider increase to 4mg  if bp holds  17.  Dyskinetic movements in head and neck - appears to be myoclonic jerks related to hypoxic brain injury - as discussed with pt mom, no specific meds for this on antispasticity meds , consioder benzo although with pt's SUD would avoid this class - EEG reviewed and was negative for seizure.  Baclofen decreased to 20mg  HS  18. Cervical extensor weakness: tizanidine decreased to 2mg  BID  19. Bowel and bladder incontinence: continue bowel and bladder program. Conitnue magneisum supplement  20. Fatigue: improved, discussed with team may be secondary to anti-spasticity medications, B12 reviewed and is 335, in suboptimal range, will give 1,000U B12 injection 8/7, will order monthly as needed, metanx started, increase to BID, continue, decrease tizanidine to BID, continue this dose  21. MRSA nares positive: negative on repeat, precautions d/ced  22. Tachycardic: resolved, d/c propanolol.  Continue Tizanidine increased to 6mg  TID>> now down to 2mg  TID. Resolved,  amantadine increased back to 100mg  BID, continue this dose, resolved  23. S/p fall: CT head reviewed and is stable  24. Bilateral foot drop: bilateral AFOs ordered, discussed that these can help her to walk better  25. Vaginal itching: completed fluconazole. Having some discharge now. See below  26. >100k E Coli UTI  -1/28 sensitive to amoxil started 11/26--continue for one week          LOS: 145 days A FACE TO FACE EVALUATION WAS PERFORMED  Ranelle Oyster 04/12/2023, 9:29 AM

## 2023-04-13 NOTE — Progress Notes (Signed)
PROGRESS NOTE   Subjective/Complaints:  Pt appreciates rx of UTI. Feels better. Spasticity also better too.  ROS: Patient denies fever, rash, sore throat, blurred vision, dizziness, nausea, vomiting, diarrhea, cough, shortness of breath or chest pain, joint or back/neck pain, headache, or mood change.     Objective:   No results found. No results for input(s): "WBC", "HGB", "HCT", "PLT" in the last 72 hours.      No results for input(s): "NA", "K", "CL", "CO2", "GLUCOSE", "BUN", "CREATININE", "CALCIUM" in the last 72 hours.       Intake/Output Summary (Last 24 hours) at 04/13/2023 1109 Last data filed at 04/13/2023 0742 Gross per 24 hour  Intake 826 ml  Output --  Net 826 ml       Pressure Injury 02/09/23 Heel Left Stage 3 -  Full thickness tissue loss. Subcutaneous fat may be visible but bone, tendon or muscle are NOT exposed. (Active)  02/09/23 0543  Location: Heel  Location Orientation: Left  Staging: Stage 3 -  Full thickness tissue loss. Subcutaneous fat may be visible but bone, tendon or muscle are NOT exposed.  Wound Description (Comments):   Present on Admission: No    Physical Exam: Vital Signs Blood pressure 98/63, pulse 71, temperature 98.2 F (36.8 C), temperature source Oral, resp. rate 18, height 5\' 1"  (1.549 m), weight 54.3 kg, SpO2 100%.   Constitutional: No distress . Vital signs reviewed. HEENT: NCAT, EOMI, oral membranes moist Neck: supple Cardiovascular: RRR without murmur. No JVD    Respiratory/Chest: CTA Bilaterally without wheezes or rales. Normal effort    GI/Abdomen: BS +, non-tender, non-distended Ext: no clubbing, cyanosis, or edema Psych: pleasant and cooperative  Skin: + Scaling dried skin on bilateral heels left buttock unstageable - not examined + Left heel stage III DTI, covered in foam dressing  Neurologic: AAOx2,  to year but not month or date. Follows most  simple commands.  + Hypophonation, speech intelligability much improved MAS 1 right elbow,MAS  tr to 1 in RIght finger and wrist flexors  MAS 1 in extensors of LE's MAS 1 L elbow , MAS 1+ in L wrist and finger flexors. Hypersensitivity to left hand improved, +left hand is not contracted, Left sided strength improved, decreased range of motion and pain at end range of motion in left shoulder--stable neuro exam  Musculoskeletal: limited bilateral shoulder and elbow ROM due to increased tone ,pain with palpation of right shoulder--unchanged    Assessment/Plan: 1. Functional deficits which require 3+ hours per day of interdisciplinary therapy in a comprehensive inpatient rehab setting. Physiatrist is providing close team supervision and 24 hour management of active medical problems listed below. Physiatrist and rehab team continue to assess barriers to discharge/monitor patient progress toward functional and medical goals  Care Tool:  Bathing    Body parts bathed by patient: Face, Abdomen, Chest, Left upper leg, Right upper leg, Left arm, Front perineal area, Right arm, Right lower leg   Body parts bathed by helper: Buttocks, Left lower leg Body parts n/a: Right arm, Left arm, Front perineal area, Buttocks, Right upper leg, Left upper leg, Right lower leg, Left lower leg   Bathing assist Assist Level:  Minimal Assistance - Patient > 75%     Upper Body Dressing/Undressing Upper body dressing   What is the patient wearing?: Pull over shirt    Upper body assist Assist Level: Set up assist    Lower Body Dressing/Undressing Lower body dressing      What is the patient wearing?: Incontinence brief, Pants     Lower body assist Assist for lower body dressing: Moderate Assistance - Patient 50 - 74%     Toileting Toileting Toileting Activity did not occur Press photographer and hygiene only): N/A (no void or bm)  Toileting assist Assist for toileting: Maximal Assistance - Patient 25 -  49%     Transfers Chair/bed transfer  Transfers assist  Chair/bed transfer activity did not occur: Safety/medical concerns  Chair/bed transfer assist level: Moderate Assistance - Patient 50 - 74%     Locomotion Ambulation   Ambulation assist   Ambulation activity did not occur: Safety/medical concerns  Assist level: Moderate Assistance - Patient 50 - 74% Assistive device: Walker-rolling Max distance: 61ft   Walk 10 feet activity   Assist  Walk 10 feet activity did not occur: Safety/medical concerns  Assist level: Moderate Assistance - Patient - 50 - 74% Assistive device: Walker-rolling   Walk 50 feet activity   Assist Walk 50 feet with 2 turns activity did not occur: Safety/medical concerns         Walk 150 feet activity   Assist Walk 150 feet activity did not occur: Safety/medical concerns         Walk 10 feet on uneven surface  activity   Assist Walk 10 feet on uneven surfaces activity did not occur: Safety/medical concerns         Wheelchair     Assist Is the patient using a wheelchair?: Yes Type of Wheelchair: Manual    Wheelchair assist level: Dependent - Patient 0% Max wheelchair distance: 30ft    Wheelchair 50 feet with 2 turns activity    Assist    Wheelchair 50 feet with 2 turns activity did not occur: Safety/medical concerns   Assist Level: Dependent - Patient 0%   Wheelchair 150 feet activity     Assist  Wheelchair 150 feet activity did not occur: Safety/medical concerns   Assist Level: Dependent - Patient 0%   Blood pressure 98/63, pulse 71, temperature 98.2 F (36.8 C), temperature source Oral, resp. rate 18, height 5\' 1"  (1.549 m), weight 54.3 kg, SpO2 100%.  Medical Problem List and Plan: 1. 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 Awaiting approval of disability. SW has been working tirelessly with TOC/others on placement. Disability  remains sticking point Code status  DNR -Continue CIR therapies including PT, OT, and SLP     2.  Impaired mobility: continue Lovenox 30mg  daily, weekly creatinine ordered             -antiplatelet therapy: N/A  3. Pain:  -Knee pain: Tylenol as needed, continue voltaren gel scheduled. XR reviewed and is stable, discussed with therapy -Right wrist swelling: XR ordered and shows no acute fracture, shows 2.66mm ulnar variance, brace  removed to given patient a break from it. Voltaren gel ordered prn for discomfort, continue, CT ordered and show no acute osseus injury, continue ace wrap   -Foot pain: tramadol ordered prn, improved today, continue prn  -Headaches: continue tylenol and tramadol prn -Low back pain: continue kpad, asked [place] to please bring for her -Whole body hypersensitivity: gabapentin 100mg  TID ordered -Pain in left heel: XR obtained and negative, cam boot ordered to help offload heel with therapy, discussed that this has helped -R shoulder pain: XR neg for acute etiology on 04/06/23, voltaren gel ordered--encourage ROM as tolerated  4. History of anxiety: discussed with her mom that she feels this was a big risk factor for her accident, propanolol d/ced due to hypotension. Continue magnesium supplement  5. Neuropsych/cognition: This patient is not capable of making decisions on her own behalf  - 10/12: Still not capable of medical decision making; BUT oriented and communicating, following directions - MUCH improved - PRN melatonin 3mg    for insomnia  6. Skin: -Buttock/sacrum--foam dressing, pressure relief, nutrition -stage 3 left heel, continue foam dressing/ pressure relief  7.  Fluids/Electrolytes/Nutrition: Routine in and outs with follow-up chemistries  -Eating  well with cueing   -Dysphagia: continue D3/thins--tolerating this diet -Low protein stores: d/c prosource since patient hates these and is eating 100% of meals -Suboptimal vitamin D level: increase to 5,000 U daily. continue this dose  -History of magnesium deficiency: magnesium 500mg  stopped to support BP  8.  Cavitary right lower lobe pneumonia likely aspiration pneumonia/MRSA pneumonia.  Resolved    Latest Ref Rng & Units 04/04/2023    6:09 AM 02/05/2023   11:12 AM 01/22/2023    6:36 AM  CBC  WBC 4.0 - 10.5 K/uL 5.6  8.5  8.1   Hemoglobin 12.0 - 15.0 g/dL 62.9  52.8  41.3   Hematocrit 36.0 - 46.0 % 42.1  46.7  47.5   Platelets 150 - 400 K/uL 224  284  274      9.  History of drug abuse.  Positive cocaine on urine drug screen.  Provide counseling  10.  AKI/hypovolemia and ATN.  Resolved Appears to have fair fluid intake, creatinine has been stable  -recheck labs Monday      Latest Ref Rng & Units 04/04/2023    6:09 AM 04/02/2023    3:39 PM 03/30/2023    5:35 AM  BMP  Glucose 70 - 99 mg/dL 91  85    BUN 6 - 20 mg/dL 20  17    Creatinine 2.44 - 1.00 mg/dL 0.10  2.72  5.36   Sodium 135 - 145 mmol/L 138  136    Potassium 3.5 - 5.1 mmol/L 3.9  4.1    Chloride 98 - 111 mmol/L 105  100    CO2 22 - 32 mmol/L 24  26    Calcium 8.9 - 10.3 mg/dL 9.3  8.8      11.  Mild transaminitis with rhabdomyolysis.  Both resolved  12.  Hypotension. Resolved, d/c midodrine -d/c propanolol, decrease baclofen to 10mg  HS>> back up to 20mg  QHS, decrease tizanidine to 2mg  TID 11/2- BP was running 87 systolic last night- and max of 110 in last 24 hours- not dizzy per chart, but if develops any dizziness, suggest primary team restarts Midodrine. Amantadine can cause hypotension, FYI Decrease tizanidine to BID  -04/11/23 bp's still soft-no change with TID tizanidine 11/29 magnesium stopped. BP's have been low  regardless of meds. Pt denies any dizziness/light-headedness.  Continue to observe. Vitals:   04/09/23 0543 04/09/23 2053 04/10/23 0452 04/10/23 2014  BP: 91/60 (!) 88/63 98/71 (!) 87/53   04/11/23 0450 04/11/23 1355 04/11/23 1918 04/11/23 2128  BP: (!) 91/52 90/63 (!) 86/55 (!) 84/58   04/12/23 0424 04/12/23 1329 04/12/23 1931 04/13/23 0510  BP: 95/60 97/70 (!) 84/49 98/63   13. HTN: see above Increase tizanidine to 6mg  TID--now 4mg  TID>>2mg  TID.    - 14. Impaired initiation: continue amantadine 100mg  BID  15. Hyperglycemia: CBGs mildly elevated, d/ced checks. Prosource d/ced as per patient's request  16. Spasticity:  -discussed serial casting with OT and her mom but she defers given pain she had when this was attempted -baclofen and tizanidine decreased due to hypotension, spasticity has been stable. Continue wrist, elbow brace, and PRAFOs. Ordered a night splint for LLE  Repeat CMP today given chronic tizanidine use -9/7 continues to have significant tone despite use of tizanidine and baclofen, may be beneficial consider baclofen pump in outpatient setting if this causes significant difficulties in care  11/29 currently on tizanidine 2mg  tid. Won't increase further because tone has improved (perhaps in part d/t UTI) and  bp won't support more. Will not decrease tizanidine as BP really hasn't changed since we moved it to TID  17.  Dyskinetic movements in head and neck - appears to be myoclonic jerks related to hypoxic brain injury - as discussed with pt mom, no specific meds for this on antispasticity meds , consioder benzo although with pt's SUD would avoid this class - EEG reviewed and was negative for seizure.  -continue baclofen at 20mg  HS  18. Cervical extensor weakness: tizanidine decreased to 2mg  BID  19. Bowel and bladder incontinence: continue bowel and bladder program. Conitnue magneisum supplement  20. Fatigue: improved, discussed with team may be secondary to  anti-spasticity medications, B12 reviewed and is 335, in suboptimal range, will give 1,000U B12 injection 8/7, will order monthly as needed, metanx started, increase to BID  -improved 11/29 21. MRSA nares positive: negative on repeat, precautions d/ced  22. Tachycardic: resolved, d/c propanolol.  Continue Tizanidine increased to 6mg  TID>> now down to 2mg  TID. Resolved, amantadine increased back to 100mg  BID, continue this dose, resolved  23. S/p fall: CT head reviewed and is stable  24. Bilateral foot drop: bilateral AFOs ordered, discussed that these can help her to walk better  25. Vaginal itching: completed fluconazole. Having some discharge now. See below  26. >100k E Coli UTI  -11/28 sensitive to amoxil started 11/26--continue for one week  -11/29 sx much improved      LOS: 146 days A FACE TO FACE EVALUATION WAS PERFORMED  Ranelle Oyster 04/13/2023, 11:09 AM

## 2023-04-13 NOTE — Progress Notes (Signed)
Physical Therapy Session Note  Patient Details  Name: Nicole [name] MRN: [id number] Date of Birth: 09/28/1975  Today's Date: 04/13/2023 PT Individual Time: 8469-6295 PT Individual Time Calculation (min): 60 min   Short Term Goals: Week 1:  PT Short Term Goal 1 (Week 1): Pt will ambulate 4ft with modA and LRAD PT Short Term Goal 1 - Progress (Week 1): Not met PT Short Term Goal 2 (Week 1): Pt will complete functional outcome measure to assess balance and falls risk PT Short Term Goal 2 - Progress (Week 1): Not met PT Short Term Goal 3 (Week 1): Week 13: Pt will complete bed<>chair transfers with totalA (pt <25%) PT Short Term Goal 3 - Progress (Week 1): Not met  Skilled Therapeutic Interventions/Progress Updates:       Pt sitting in wheelchair to start and in agreement to therapy treatment. Has her L CAM boot on and no c/o pain.  Transported at wheelchair level to day room rehab gym. Stand pivot transfer with minA to mat table, using B HHA and cues for awareness while she pivots. Gait training 2x22ft with youth sized RW and min/modA (minA for first ~37ft, modA for remaining distance). Cues for upright posture, keeping body within walker frame, and lengthening stride. Pt initially favored the R side of her RW and needed cues for awareness to stay in midline to avoid overcrowding and/or kicking RW frame as she steps.   Dynamic balance training in unsupported standing with minA - pt catching and tossing large beach ball with PT facilitating trunk support and L knee extension. Continues cues for upright positioning for postural awareness. Also completed beach ball taps with 1# dowel rod with similar assist and setup. Limited shoulder ROM to shoulder height level.   Finished session with dynamic gait training using B HHA and +2 assist for safety. Pt able to ambulate ~30ft with this setup but needed quick sitting on [name] lap (PT sitting on stool) due to fatigue and pt with decreased awareness  (trying to sit before having chair). Continued to work on upright posture with this dynamic gait training and added weight bearing through BLE without the RW.   Pt returned to her room and assisted to bed with stand pivot transfer. ModA needed for returning to supine position. All needs met at end, bed alarm on.    Therapy Documentation Precautions:  Precautions Precautions: Fall Precaution Comments: delayed processing, Increased tone UB and LB Required Braces or Orthoses: Other Brace Other Brace: B adjustable night splints; B elbow "cosey" splints; L palm protector; R mitt Restrictions Weight Bearing Restrictions: No General:      Therapy/Group: Individual Therapy  Orrin Brigham 04/13/2023, 7:54 AM

## 2023-04-13 NOTE — Progress Notes (Signed)
Physical Therapy Session Note  Patient Details  Name: Nicole [name] MRN: [id number] Date of Birth: June 07, 1975  Today's Date: 04/13/2023 PT Individual Time: 1134-1200 PT Individual Time Calculation (min): 26 min   Short Term Goals: Week 9: PT Short Term Goal 1 (Week 9): Pt will initiate bed mobility with totalA PT Short Term Goal 2 (Week 9): Pt will maintain unsupported sitting balance for 30 seconds without LOB PT Short Term Goal 3 (Week 9): Pt will complete bed<>chair transfers with no more than totalA  Skilled Therapeutic Interventions/Progress Updates:  Patient supine in bed on entrance to room. Patient alert and agreeable to PT session.   Patient with no pain complaint at start of session.  Therapeutic Activity: Bed Mobility: Pt guided in positioning BLE into hooklying, bring knees to side and turning self to L and then is able to push up into seated position on EOB with overall supervision and tc to assist vc for technique and sequencing.  Transfers: Pt impulsive in sit<>stand throughout session and requires education and vc/ tc for improved ability to perform stand/ squat pivot throughout. Focus on squat pivot performance this session. Blocked practice with education on foot positioning with LE closest to target seat out front and closer to target seat with increased use of opposite LE for power up. Pt continues to require MinA/ CGA to complete with safety as does tend to want to sit too early or not adequately lift.   NMR performed for improvements in motor control and coordination, balance, sequencing, judgement, and self confidence/ efficacy in performing all aspects of mobility at highest level of independence.   Patient seated upright in w/c at end of session with brakes locked, belt alarm set, and all needs within reach.   Therapy Documentation Precautions:  Precautions Precautions: Fall Precaution Comments: delayed processing, Increased tone UB and LB Required Braces or  Orthoses: Other Brace Other Brace: B adjustable night splints; B elbow "cosey" splints; L palm protector; R mitt Restrictions Weight Bearing Restrictions: No  Pain:  Pain related with bumping hip to w/c armrest during transfer that decreases with time and repositioning.   Therapy/Group: Individual Therapy  Loel Dubonnet PT, DPT, CSRS 04/13/2023, 1:03 PM

## 2023-04-13 NOTE — Progress Notes (Signed)
Occupational Therapy Session Note  Patient Details  Name: Nicole [name] MRN: [id number] Date of Birth: [birth year]  Today's Date: 04/13/2023 OT Individual Time:  -       Short Term Goals: Week 1:  OT Short Term Goal 1 (Week 1): Week 19:  Patient will stand at sink with min assist long enough for helper to pull up pants OT Short Term Goal 1 - Progress (Week 1): Progressing toward goal OT Short Term Goal 2 (Week 1): Week 19:  Patient will choose clothing items from drawer without cueing once placed in front of dresser OT Short Term Goal 2 - Progress (Week 1): Met OT Short Term Goal 3 (Week 1): Week 19:  Patient will don right sock on foot with min assist for trunk control OT Short Term Goal 3 - Progress (Week 1): Progressing toward goal OT Short Term Goal 4 (Week 1): Week 19:  Patient will utilize external cues to orient herself to time/ date  Skilled Therapeutic Interventions/Progress Updates:    Patient received supine in bed, eager to get up.  Patient transitioned to sitting with min assist and to wheelchair with min assist and cueing to transition hand from one arm rest to the next.  Patient transferred to shower seat with min assist and facilitation/ cueing to maintain upright posture while stepping around bench.  Patient able to bathe herself while seated using cutout for pericare.  Patient needing assist to clean buttocks after BM.   Transported patient to sink to complete grooming and dressing.  Patient more self directed with familiar tasks and able to hold conversations and still carry on with tasks.   Patient attempted to pull hair into ponytail but replied - " I can't do it with these [employer] arms."  Patient has limited movement at glenohumeral joint bilaterally.  Left up in wheelchair with chair pad alarm in place and engaged.  Personal items in place.    Therapy Documentation Precautions:  Precautions Precautions: Fall Precaution Comments: delayed processing, Increased tone UB and  LB Required Braces or Orthoses: Other Brace Other Brace: B adjustable night splints; B elbow "cosey" splints; L palm protector; R mitt Restrictions Weight Bearing Restrictions: No  Pain:  No pain      Therapy/Group: Individual Therapy  [name] 04/13/2023, 8:58 AM

## 2023-04-13 NOTE — Progress Notes (Signed)
Physical Therapy Session Note  Patient Details  Name: Nicole [name] MRN: [id number] Date of Birth: Jul 19, 1975  Today's Date: 04/13/2023 PT Individual Time: 1332-1450 PT Individual Time Calculation (min): 78 min   Short Term Goals: Week 9: PT Short Term Goal 1 (Week 9): Pt will initiate bed mobility with totalA PT Short Term Goal 2 (Week 9): Pt will maintain unsupported sitting balance for 30 seconds without LOB PT Short Term Goal 3 (Week 9): Pt will complete bed<>chair transfers with no more than totalA  Skilled Therapeutic Interventions/Progress Updates:  Patient seated upright in w/c on entrance to room. Patient alert and agreeable to PT session. Enjoyed breakfast for lunch.   Patient with no pain complaint at start of session.  Therapeutic Activity: Transfers: Pt performed sit<>stand and stand pivot transfers throughout session with MinA overall for safe positioning and improving technique with vc/ tc provided.  Gait Training:  Pt ambulated ~50 ft using youth RW with MinA.  Demonstrated ability to maintain upright posture with stance to RLE, but flexes forward with WB to LLE throughout. Provided vc/ tc for attempt to maintain upright posture. Intermittent MinA for managing RW.   Ambulates 10' x1 without AD in order to reach stairs with Min/ ModA for balance. Stair training with pt attempting to ascend with LLE, but provided with vc to ascend leading with RLE, then bringing LLE to same step. Requires significant cueing at top of steps in order to complete turn. Then aligned toes with edge of top step. Cued to advance BUE, then bring LLEdown to step. Demos mild adduction but is able to hold LLE in order to advance RLE. Does required Min/ ModA and significant cues to complete with balance and safety.   Ambulation 5' without AD and Min/ ModA. Significant flexion noted during ambulation this bout and provided with RW and is able to continue for 15 more feet until fatigue and reduced ability to  advance LLE.   Wheelchair Mobility:  Pt propelled wheelchair 50 feet with use of RLE>LLE. Provided vc/ tc for attempt to reciprocate BLE use. Pt relates difficulty with LLE advancement.   Neuromuscular Re-ed: NMR facilitated during session with focus on standing balance, improved spinal mobility, improved pelvic mobility. Pt guided in seated pelvic tilts requiring education and demonstration of ant/post pelvic tilting with use of slumped posture then upright posture. Pt requires hands on assist initially and then only requires vc/ tc to continue. Able to perform for bouts x3.   Guided in spinal rotation exercise first using color dots to each side of pt and requesting pt to place Bil hands on dot to one side and then other side. Performed for bouts x2. Progressed to holding dots to each side of pt and guided in opposite UE punch to dot to each side in order to promote increased spinal rotation. Performed 4 bouts x45min each with good ability to progress to punch with clled UE to called color dot. NMR performed for improvements in motor control and coordination, balance, sequencing, judgement, and self confidence/ efficacy in performing all aspects of mobility at highest level of independence.   Bed Mobility: Pt performed supine <> sit with significant vc/ tc for coming to sidelying then roll to back. VC/ tc required for technique. Requires 2 attempts for improved positioning in bed.   Patient supine in bed at end of session with brakes locked, bed alarm set, and all needs within reach.   Therapy Documentation Precautions:  Precautions Precautions: Fall Precaution Comments:  delayed processing, Increased tone UB and LB Required Braces or Orthoses: Other Brace Other Brace: B adjustable night splints; B elbow "cosey" splints; L palm protector; R mitt Restrictions Weight Bearing Restrictions: No  Pain:       Therapy/Group: Individual Therapy  Loel Dubonnet PT, DPT,  CSRS 04/13/2023, 4:52 PM

## 2023-04-13 NOTE — Progress Notes (Signed)
Recreational Therapy Session Note  Patient Details  Name: Nicole [name] MRN: [id number] Date of Birth: 01/24/1976 Today's Date: 04/13/2023  Pain: no c/o Skilled Therapeutic Interventions/Progress Updates: Goal:  Pt will maintain dynamic standing balance with min assist.  MET  Session focused on activity tolerance, dynamic standing balance, UE use, attention during co-treat with PT.  PT stood for ball toss activity with min assist & instructional cues for balance, trunk/hip control, L knee extension.  Transitioned to tapping the beach ball using 1# dowel with BUEs with Min assist.  During seated rest breaks, discussed  Thanksgiving day events, favorite foods and bedazzling her cam boot.  Pt smiling and laughing during discussion.  Therapy/Group: Co-Treatment Jeron Grahn 04/13/2023, 12:07 PM

## 2023-04-13 NOTE — Progress Notes (Addendum)
Speech Language Pathology Weekly Progress Note  Patient Details  Name: Nicole [name] MRN: [id number] Date of Birth: 26-Nov-1975  Beginning of progress report period: March 29, 2023 End of progress report period: April 13, 2023  Short Term Goals: Week 9: SLP Short Term Goal 1 (Week 9): Week 19 - Pt will solve basic to mildly complex problems with 90% accuracy given min assist. SLP Short Term Goal 1 - Progress (Week 9): Not met SLP Short Term Goal 2 (Week 9): Week 19 -Patient will utilize external aids for orientation to place and time with supervision level verbal cues SLP Short Term Goal 2 - Progress (Week 9): Not met SLP Short Term Goal 3 (Week 9): Week 19 - Pt will demonstrate sustained attention to basic tasks for 15 minutes with Min A multimodal cues for redirection. SLP Short Term Goal 3 - Progress (Week 9): Met SLP Short Term Goal 4 (Week 9): Week 19 - Pt will utilize increased vocal intensity w/ supervision verbal cues to remain 90% intelligibile at sentence level SLP Short Term Goal 4 - Progress (Week 9): Met  New Short Term Goals: Week 9: SLP Short Term Goal 1 (Week 9): Week 21 - Pt will solve basic environmental problems with 90% accuracy given min assist. SLP Short Term Goal 2 (Week 9): Week 21 -Patient will utilize external aids for orientation to place and time with supervision level verbal cues SLP Short Term Goal 3 (Week 9): Week 21 - Pt will demonstrate sustained attention to basic tasks for 30 minutes with supervision A multimodal cues for redirection. SLP Short Term Goal 4 (Week 9): Week 21 - Pt will utilize increased vocal intensity w/ min verbal cues to remain 90% intelligibile at conversation level  Weekly Progress Updates: Patient continues to make excellent progress towards therapy goals, meeting 2/4 short term goals set this reporting period. Patient continues to require min assist to orient to date and often perseverates that it is June, though improving with ease  of reorientation to correct date each session. Patient speech intelligibility continues to depend on patient's overall fatigue levels though patient is largely 90% intelligible at the sentence level with supervision. Patient sustains attention to task for 30+ minutes when not fatigued but does require min assist to remain awake if fatigue is impacting performance. Patient solves basic environmental problems with min-mod assist overall. Patient and family education ongoing. Patient will continue to benefit from skilled therapy services during remainder of CIR stay.    Intensity: Minumum of 1-2 x/day, 30 to 90 minutes Frequency: 3 to 5 out of 7 days Duration/Length of Stay: TBD due to SNF placement Treatment/Interventions: Cognitive remediation/compensation;Internal/external aids;Speech/Language facilitation;Cueing hierarchy;Environmental controls;Therapeutic Activities;Functional tasks;Multimodal communication approach;Patient/family education;Therapeutic Exercise  [name] 04/13/2023, 12:57 PM

## 2023-04-14 DIAGNOSIS — K5901 Slow transit constipation: Secondary | ICD-10-CM

## 2023-04-14 MED ORDER — BISACODYL 5 MG PO TBEC
5.0000 mg | DELAYED_RELEASE_TABLET | Freq: Every day | ORAL | Status: DC | PRN
  Administered 2023-04-27 – 2023-05-23 (×6): 5 mg via ORAL
  Filled 2023-04-14 (×9): qty 1

## 2023-04-14 NOTE — Progress Notes (Signed)
PROGRESS NOTE   Subjective/Complaints:  Pt doing well today! Slept ok, denies pain, LBM yesterday but pt wanting a laxative because she thinks she needs to go but hasn't yet. Urinating ok, frequency still present but otherwise urination better. Denies any other complaints or concerns today.   ROS: Patient denies fever, rash, sore throat, blurred vision, dizziness, nausea, vomiting, diarrhea, cough, shortness of breath or chest pain, joint or back/neck pain, headache, or mood change.     Objective:   No results found. No results for input(s): "WBC", "HGB", "HCT", "PLT" in the last 72 hours.      No results for input(s): "NA", "K", "CL", "CO2", "GLUCOSE", "BUN", "CREATININE", "CALCIUM" in the last 72 hours.       Intake/Output Summary (Last 24 hours) at 04/14/2023 1556 Last data filed at 04/14/2023 1223 Gross per 24 hour  Intake 954 ml  Output --  Net 954 ml       Pressure Injury 02/09/23 Heel Left Stage 3 -  Full thickness tissue loss. Subcutaneous fat may be visible but bone, tendon or muscle are NOT exposed. (Active)  02/09/23 0543  Location: Heel  Location Orientation: Left  Staging: Stage 3 -  Full thickness tissue loss. Subcutaneous fat may be visible but bone, tendon or muscle are NOT exposed.  Wound Description (Comments):   Present on Admission: No    Physical Exam: Vital Signs Blood pressure (!) 88/55, pulse 76, temperature 97.8 F (36.6 C), temperature source Oral, resp. rate 16, height 5\' 1"  (1.549 m), weight 54.3 kg, SpO2 99%.   Constitutional: No distress . Vital signs reviewed. HEENT: NCAT, EOMI, oral membranes moist Neck: supple Cardiovascular: RRR without murmur. No JVD    Respiratory/Chest: CTA Bilaterally without wheezes or rales. Normal effort    GI/Abdomen: BS +, non-tender, non-distended Ext: no clubbing, cyanosis, or edema Psych: pleasant and cooperative Neuro: speech clearer  today  PRIOR EXAMS: Skin: + Scaling dried skin on bilateral heels left buttock unstageable - not examined + Left heel stage III DTI, covered in foam dressing  Neurologic: AAOx2,  to year but not month or date. Follows most simple commands.  + Hypophonation, speech intelligability much improved MAS 1 right elbow,MAS  tr to 1 in RIght finger and wrist flexors  MAS 1 in extensors of LE's MAS 1 L elbow , MAS 1+ in L wrist and finger flexors. Hypersensitivity to left hand improved, +left hand is not contracted, Left sided strength improved, decreased range of motion and pain at end range of motion in left shoulder--stable neuro exam  Musculoskeletal: limited bilateral shoulder and elbow ROM due to increased tone ,pain with palpation of right shoulder--unchanged    Assessment/Plan: 1. Functional deficits which require 3+ hours per day of interdisciplinary therapy in a comprehensive inpatient rehab setting. Physiatrist is providing close team supervision and 24 hour management of active medical problems listed below. Physiatrist and rehab team continue to assess barriers to discharge/monitor patient progress toward functional and medical goals  Care Tool:  Bathing    Body parts bathed by patient: Face, Abdomen, Chest, Left upper leg, Right upper leg, Left arm, Front perineal area, Right arm, Right lower leg  Body parts bathed by helper: Buttocks, Left lower leg Body parts n/a: Right arm, Left arm, Front perineal area, Buttocks, Right upper leg, Left upper leg, Right lower leg, Left lower leg   Bathing assist Assist Level: Minimal Assistance - Patient > 75%     Upper Body Dressing/Undressing Upper body dressing   What is the patient wearing?: Pull over shirt    Upper body assist Assist Level: Set up assist    Lower Body Dressing/Undressing Lower body dressing      What is the patient wearing?: Incontinence brief, Pants     Lower body assist Assist for lower body dressing:  Moderate Assistance - Patient 50 - 74%     Toileting Toileting Toileting Activity did not occur Press photographer and hygiene only): N/A (no void or bm)  Toileting assist Assist for toileting: Maximal Assistance - Patient 25 - 49%     Transfers Chair/bed transfer  Transfers assist  Chair/bed transfer activity did not occur: Safety/medical concerns  Chair/bed transfer assist level: Moderate Assistance - Patient 50 - 74%     Locomotion Ambulation   Ambulation assist   Ambulation activity did not occur: Safety/medical concerns  Assist level: Moderate Assistance - Patient 50 - 74% Assistive device: Walker-rolling Max distance: 40ft   Walk 10 feet activity   Assist  Walk 10 feet activity did not occur: Safety/medical concerns  Assist level: Moderate Assistance - Patient - 50 - 74% Assistive device: Walker-rolling   Walk 50 feet activity   Assist Walk 50 feet with 2 turns activity did not occur: Safety/medical concerns         Walk 150 feet activity   Assist Walk 150 feet activity did not occur: Safety/medical concerns         Walk 10 feet on uneven surface  activity   Assist Walk 10 feet on uneven surfaces activity did not occur: Safety/medical concerns         Wheelchair     Assist Is the patient using a wheelchair?: Yes Type of Wheelchair: Manual    Wheelchair assist level: Dependent - Patient 0% Max wheelchair distance: 49ft    Wheelchair 50 feet with 2 turns activity    Assist    Wheelchair 50 feet with 2 turns activity did not occur: Safety/medical concerns   Assist Level: Dependent - Patient 0%   Wheelchair 150 feet activity     Assist  Wheelchair 150 feet activity did not occur: Safety/medical concerns   Assist Level: Dependent - Patient 0%   Blood pressure (!) 88/55, pulse 76, temperature 97.8 F (36.6 C), temperature source Oral, resp. rate 16, height 5\' 1"  (1.549 m), weight 54.3 kg, SpO2 99%.  Medical  Problem List and Plan: 1. 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 Awaiting approval of disability. SW has been working tirelessly with TOC/others on placement. Disability  remains sticking point Code status  DNR -Continue CIR therapies including PT, OT, and SLP     2.  Impaired mobility: continue Lovenox 30mg  daily, weekly creatinine ordered             -  antiplatelet therapy: N/A  3. Pain:  -Knee pain: Tylenol as needed, continue voltaren gel scheduled. XR reviewed and is stable, discussed with therapy -Right wrist swelling: XR ordered and shows no acute fracture, shows 2.24mm ulnar variance, brace removed to given patient a break from it. Voltaren gel ordered prn for discomfort, continue, CT ordered and show no acute osseus injury, continue ace wrap   -Foot pain: tramadol ordered prn, improved today, continue prn  -Headaches: continue tylenol and tramadol prn -Low back pain: continue kpad, asked [place] to please bring for her -Whole body hypersensitivity: gabapentin 100mg  TID ordered -Pain in left heel: XR obtained and negative, cam boot ordered to help offload heel with therapy, discussed that this has helped -R shoulder pain: XR neg for acute etiology on 04/06/23, voltaren gel ordered--encourage ROM as tolerated  4. History of anxiety: discussed with her mom that she feels this was a big risk factor for her accident, propanolol d/ced due to hypotension. Continue magnesium supplement  5. Neuropsych/cognition: This patient is not capable of making decisions on her own behalf  - 10/12: Still not capable of medical decision making; BUT oriented  and communicating, following directions - MUCH improved - PRN melatonin 3mg    for insomnia  6. Skin: -Buttock/sacrum--foam dressing, pressure relief, nutrition -stage 3 left heel, continue foam dressing/ pressure relief  7. Fluids/Electrolytes/Nutrition: Routine in and outs with follow-up chemistries  -Eating  well with cueing   -Dysphagia: continue D3/thins--tolerating this diet -Low protein stores: d/c prosource since patient hates these and is eating 100% of meals -Suboptimal vitamin D level: increase to 5,000 U daily. continue this dose  -History of magnesium deficiency: magnesium 500mg  stopped to support BP  8.  Cavitary right lower lobe pneumonia likely aspiration pneumonia/MRSA pneumonia.  Resolved    Latest Ref Rng & Units 04/04/2023    6:09 AM 02/05/2023   11:12 AM 01/22/2023    6:36 AM  CBC  WBC 4.0 - 10.5 K/uL 5.6  8.5  8.1   Hemoglobin 12.0 - 15.0 g/dL 16.1  09.6  04.5   Hematocrit 36.0 - 46.0 % 42.1  46.7  47.5   Platelets 150 - 400 K/uL 224  284  274      9.  History of drug abuse.  Positive cocaine on urine drug screen.  Provide counseling  10.  AKI/hypovolemia and ATN.  Resolved Appears to have fair fluid intake, creatinine has been stable  -recheck labs Monday      Latest Ref Rng & Units 04/04/2023    6:09 AM 04/02/2023    3:39 PM 03/30/2023    5:35 AM  BMP  Glucose 70 - 99 mg/dL 91  85    BUN 6 - 20 mg/dL 20  17    Creatinine 4.09 - 1.00 mg/dL 8.11  9.14  7.82   Sodium 135 - 145 mmol/L 138  136    Potassium 3.5 - 5.1 mmol/L 3.9  4.1    Chloride 98 - 111 mmol/L 105  100    CO2 22 - 32 mmol/L 24  26    Calcium 8.9 - 10.3 mg/dL 9.3  8.8      11.  Mild transaminitis with rhabdomyolysis.  Both resolved  12.  Hypotension. Resolved, d/c midodrine -d/c propanolol, decrease baclofen to 10mg  HS>> back up to 20mg  QHS, decrease tizanidine to 2mg  TID 11/2- BP was running 87 systolic last night- and max of 110 in last 24 hours- not dizzy per chart, but if develops  any dizziness, suggest primary team restarts Midodrine. Amantadine can cause hypotension, FYI Decrease tizanidine to BID  -04/11/23 bp's still soft-no change with TID tizanidine 11/29 magnesium stopped. BP's have been low regardless of meds. Pt denies any dizziness/light-headedness. Continue to observe. -04/14/23 BPs soft but stable, monitor Vitals:   04/10/23 2014 04/11/23 0450 04/11/23 1355 04/11/23 1918  BP: (!) 87/53 (!) 91/52 90/63 (!) 86/55   04/11/23 2128 04/12/23 0424 04/12/23 1329 04/12/23 1931  BP: (!) 84/58 95/60 97/70  (!) 84/49   04/13/23 0510 04/13/23 1925 04/14/23 0413 04/14/23 1314  BP: 98/63 94/71 (!) 90/55 (!) 88/55   13. HTN: see above Increase tizanidine to 6mg  TID--now 4mg  TID>>2mg  TID.    - 14. Impaired initiation: continue amantadine 100mg  BID  15. Hyperglycemia: CBGs mildly elevated, d/ced checks. Prosource d/ced as per patient's request  16. Spasticity:  -discussed serial casting with OT and her mom but she defers given pain she had when this was attempted -baclofen and tizanidine decreased due to hypotension, spasticity has been stable. Continue wrist, elbow brace, and PRAFOs. Ordered a night splint for LLE  Repeat CMP today given chronic tizanidine use -9/7 continues to have significant tone despite use of tizanidine and baclofen, may be beneficial consider baclofen pump in outpatient setting if this causes significant difficulties in care  11/29 currently on tizanidine 2mg  tid. Won't increase further because tone has improved (perhaps in part d/t UTI) and  bp won't support more. Will not decrease tizanidine as BP really hasn't changed since we moved it to TID  17.  Dyskinetic movements in head and neck - appears to be myoclonic jerks related to hypoxic brain injury - as discussed with pt mom, no specific meds for this on antispasticity meds , consioder benzo although with pt's SUD would avoid this class - EEG reviewed and was negative for seizure.  -continue  baclofen at 20mg  HS  18. Cervical extensor weakness: tizanidine decreased to 2mg  BID  19. Bowel and bladder incontinence: continue bowel and bladder program. Conitnue magneisum supplement  -04/14/23 pt requesting laxative, had BM yesterday type 4, will add dulcolax QD PRN but advised she give it some time today to see if she has a BM on her own, cont miralax daily as well.   20. Fatigue: improved, discussed with team may be secondary to anti-spasticity medications, B12 reviewed and is 335, in suboptimal range, will give 1,000U B12 injection 8/7, will order monthly as needed, metanx started, increase to BID  -improved 11/29  21. MRSA nares positive: negative on repeat, precautions d/ced  22. Tachycardic: resolved, d/c propanolol.  Continue Tizanidine increased to 6mg  TID>> now down to 2mg  TID. Resolved, amantadine increased back to 100mg  BID, continue this dose, resolved  23. S/p fall: CT head reviewed and is stable  24. Bilateral foot drop: bilateral AFOs ordered, discussed that these can help her to walk better  25. Vaginal itching: completed fluconazole. Having some discharge now. See below  26. >100k E Coli UTI  -11/28 sensitive to amoxil started 11/26--continue for one week  -11/29 sx much improved      LOS: 147 days A FACE TO FACE EVALUATION WAS PERFORMED  [address] 04/14/2023, 3:56 PM

## 2023-04-15 NOTE — Progress Notes (Signed)
PROGRESS NOTE   Subjective/Complaints:  Pt doing well again today. Slept ok, having a little R knee pain but well controlled overall and not too bothersome. LBM this morning. Urinating ok. Denies any other complaints or concerns today.   ROS: as per HPI. Denies CP, SOB, abd pain, N/V/D/C, or any other complaints at this time.  .     Objective:   No results found. No results for input(s): "WBC", "HGB", "HCT", "PLT" in the last 72 hours.      No results for input(s): "NA", "K", "CL", "CO2", "GLUCOSE", "BUN", "CREATININE", "CALCIUM" in the last 72 hours.       Intake/Output Summary (Last 24 hours) at 04/15/2023 8119 Last data filed at 04/14/2023 1743 Gross per 24 hour  Intake 476 ml  Output --  Net 476 ml       Pressure Injury 02/09/23 Heel Left Stage 3 -  Full thickness tissue loss. Subcutaneous fat may be visible but bone, tendon or muscle are NOT exposed. (Active)  02/09/23 0543  Location: Heel  Location Orientation: Left  Staging: Stage 3 -  Full thickness tissue loss. Subcutaneous fat may be visible but bone, tendon or muscle are NOT exposed.  Wound Description (Comments):   Present on Admission: No    Physical Exam: Vital Signs Blood pressure 103/70, pulse 86, temperature 98.7 F (37.1 C), resp. rate 16, height 5\' 1"  (1.549 m), weight 54.3 kg, SpO2 99%.   Constitutional: No distress . Vital signs reviewed. Resting in bed.  HEENT: NCAT, EOMI, oral membranes moist, glasses donned Neck: supple Cardiovascular: RRR without murmur. No JVD    Respiratory/Chest: CTA Bilaterally without wheezes or rales. Normal effort    GI/Abdomen: soft, BS +, non-tender, non-distended Ext: no clubbing, cyanosis, or edema Psych: pleasant and cooperative Neuro: speech clearer today  PRIOR EXAMS: Skin: + Scaling dried skin on bilateral heels left buttock unstageable - not examined + Left heel stage III DTI, covered in  foam dressing  Neurologic: AAOx2,  to year but not month or date. Follows most simple commands.  + Hypophonation, speech intelligability much improved MAS 1 right elbow,MAS  tr to 1 in RIght finger and wrist flexors  MAS 1 in extensors of LE's MAS 1 L elbow , MAS 1+ in L wrist and finger flexors. Hypersensitivity to left hand improved, +left hand is not contracted, Left sided strength improved, decreased range of motion and pain at end range of motion in left shoulder--stable neuro exam  Musculoskeletal: limited bilateral shoulder and elbow ROM due to increased tone ,pain with palpation of right shoulder--unchanged    Assessment/Plan: 1. Functional deficits which require 3+ hours per day of interdisciplinary therapy in a comprehensive inpatient rehab setting. Physiatrist is providing close team supervision and 24 hour management of active medical problems listed below. Physiatrist and rehab team continue to assess barriers to discharge/monitor patient progress toward functional and medical goals  Care Tool:  Bathing    Body parts bathed by patient: Face, Abdomen, Chest, Left upper leg, Right upper leg, Left arm, Front perineal area, Right arm, Right lower leg   Body parts bathed by helper: Buttocks, Left lower leg Body parts n/a: Right arm,  Left arm, Front perineal area, Buttocks, Right upper leg, Left upper leg, Right lower leg, Left lower leg   Bathing assist Assist Level: Minimal Assistance - Patient > 75%     Upper Body Dressing/Undressing Upper body dressing   What is the patient wearing?: Pull over shirt    Upper body assist Assist Level: Set up assist    Lower Body Dressing/Undressing Lower body dressing      What is the patient wearing?: Incontinence brief, Pants     Lower body assist Assist for lower body dressing: Moderate Assistance - Patient 50 - 74%     Toileting Toileting Toileting Activity did not occur Press photographer and hygiene only): N/A (no void  or bm)  Toileting assist Assist for toileting: Maximal Assistance - Patient 25 - 49%     Transfers Chair/bed transfer  Transfers assist  Chair/bed transfer activity did not occur: Safety/medical concerns  Chair/bed transfer assist level: Moderate Assistance - Patient 50 - 74%     Locomotion Ambulation   Ambulation assist   Ambulation activity did not occur: Safety/medical concerns  Assist level: Moderate Assistance - Patient 50 - 74% Assistive device: Walker-rolling Max distance: 87ft   Walk 10 feet activity   Assist  Walk 10 feet activity did not occur: Safety/medical concerns  Assist level: Moderate Assistance - Patient - 50 - 74% Assistive device: Walker-rolling   Walk 50 feet activity   Assist Walk 50 feet with 2 turns activity did not occur: Safety/medical concerns         Walk 150 feet activity   Assist Walk 150 feet activity did not occur: Safety/medical concerns         Walk 10 feet on uneven surface  activity   Assist Walk 10 feet on uneven surfaces activity did not occur: Safety/medical concerns         Wheelchair     Assist Is the patient using a wheelchair?: Yes Type of Wheelchair: Manual    Wheelchair assist level: Dependent - Patient 0% Max wheelchair distance: 40ft    Wheelchair 50 feet with 2 turns activity    Assist    Wheelchair 50 feet with 2 turns activity did not occur: Safety/medical concerns   Assist Level: Dependent - Patient 0%   Wheelchair 150 feet activity     Assist  Wheelchair 150 feet activity did not occur: Safety/medical concerns   Assist Level: Dependent - Patient 0%   Blood pressure 103/70, pulse 86, temperature 98.7 F (37.1 C), resp. rate 16, height 5\' 1"  (1.549 m), weight 54.3 kg, SpO2 99%.  Medical Problem List and Plan: 1. 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 Awaiting approval of disability. SW has been working tirelessly with TOC/others on placement. Disability  remains sticking point Code status  DNR -Continue CIR therapies including PT, OT, and SLP     2.  Impaired mobility: continue Lovenox 30mg  daily, weekly creatinine ordered             -antiplatelet therapy: N/A  3. Pain:  -Knee pain: Tylenol as needed, continue voltaren gel scheduled. XR reviewed  and is stable, discussed with therapy -Right wrist swelling: XR ordered and shows no acute fracture, shows 2.29mm ulnar variance, brace removed to given patient a break from it. Voltaren gel ordered prn for discomfort, continue, CT ordered and show no acute osseus injury, continue ace wrap   -Foot pain: tramadol ordered prn, improved today, continue prn  -Headaches: continue tylenol and tramadol prn -Low back pain: continue kpad, asked [place] to please bring for her -Whole body hypersensitivity: gabapentin 100mg  TID ordered -Pain in left heel: XR obtained and negative, cam boot ordered to help offload heel with therapy, discussed that this has helped -R shoulder pain: XR neg for acute etiology on 04/06/23, voltaren gel ordered--encourage ROM as tolerated  4. History of anxiety: discussed with her mom that she feels this was a big risk factor for her accident, propanolol d/ced due to hypotension. Continue magnesium supplement  5. Neuropsych/cognition: This patient is not capable of making decisions on her own behalf  - 10/12: Still not capable of medical decision making; BUT oriented and communicating, following directions - MUCH improved - PRN melatonin 3mg    for insomnia  6. Skin: -Buttock/sacrum--foam dressing, pressure relief, nutrition -stage 3 left  heel, continue foam dressing/ pressure relief  7. Fluids/Electrolytes/Nutrition: Routine in and outs with follow-up chemistries  -Eating  well with cueing   -Dysphagia: continue D3/thins--tolerating this diet -Low protein stores: d/c prosource since patient hates these and is eating 100% of meals -Suboptimal vitamin D level: increase to 5,000 U daily. continue this dose  -History of magnesium deficiency: magnesium 500mg  stopped to support BP  8.  Cavitary right lower lobe pneumonia likely aspiration pneumonia/MRSA pneumonia.  Resolved    Latest Ref Rng & Units 04/04/2023    6:09 AM 02/05/2023   11:12 AM 01/22/2023    6:36 AM  CBC  WBC 4.0 - 10.5 K/uL 5.6  8.5  8.1   Hemoglobin 12.0 - 15.0 g/dL 09.8  11.9  14.7   Hematocrit 36.0 - 46.0 % 42.1  46.7  47.5   Platelets 150 - 400 K/uL 224  284  274      9.  History of drug abuse.  Positive cocaine on urine drug screen.  Provide counseling  10.  AKI/hypovolemia and ATN.  Resolved Appears to have fair fluid intake, creatinine has been stable  -recheck labs Monday      Latest Ref Rng & Units 04/04/2023    6:09 AM 04/02/2023    3:39 PM 03/30/2023    5:35 AM  BMP  Glucose 70 - 99 mg/dL 91  85    BUN 6 - 20 mg/dL 20  17    Creatinine 8.29 - 1.00 mg/dL 5.62  1.30  8.65   Sodium 135 - 145 mmol/L 138  136    Potassium 3.5 - 5.1 mmol/L 3.9  4.1    Chloride 98 - 111 mmol/L 105  100    CO2 22 - 32 mmol/L 24  26    Calcium 8.9 - 10.3 mg/dL 9.3  8.8      11.  Mild transaminitis with rhabdomyolysis.  Both resolved  12.  Hypotension. Resolved, d/c midodrine -d/c propanolol, decrease baclofen to 10mg  HS>> back up to 20mg  QHS, decrease tizanidine to 2mg  TID 11/2- BP was running 87 systolic last night- and max of 110 in last 24 hours- not dizzy per chart, but if develops any dizziness, suggest primary team restarts Midodrine. Amantadine can cause hypotension, FYI Decrease tizanidine to BID  -04/11/23 bp's  still soft-no change with TID  tizanidine 11/29 magnesium stopped. BP's have been low regardless of meds. Pt denies any dizziness/light-headedness. Continue to observe. -11/30-12/1 BPs soft but stable, monitor Vitals:   04/11/23 0450 04/11/23 1355 04/11/23 1918 04/11/23 2128  BP: (!) 91/52 90/63 (!) 86/55 (!) 84/58   04/12/23 0424 04/12/23 1329 04/12/23 1931 04/13/23 0510  BP: 95/60 97/70 (!) 84/49 98/63   04/13/23 1925 04/14/23 0413 04/14/23 1314 04/14/23 2021  BP: 94/71 (!) 90/55 (!) 88/55 103/70   13. HTN: see above Increase tizanidine to 6mg  TID--now 4mg  TID>>2mg  TID.    - 14. Impaired initiation: continue amantadine 100mg  BID  15. Hyperglycemia: CBGs mildly elevated, d/ced checks. Prosource d/ced as per patient's request  16. Spasticity:  -discussed serial casting with OT and her mom but she defers given pain she had when this was attempted -baclofen and tizanidine decreased due to hypotension, spasticity has been stable. Continue wrist, elbow brace, and PRAFOs. Ordered a night splint for LLE  Repeat CMP today given chronic tizanidine use -9/7 continues to have significant tone despite use of tizanidine and baclofen, may be beneficial consider baclofen pump in outpatient setting if this causes significant difficulties in care  11/29 currently on tizanidine 2mg  tid. Won't increase further because tone has improved (perhaps in part d/t UTI) and  bp won't support more. Will not decrease tizanidine as BP really hasn't changed since we moved it to TID  17.  Dyskinetic movements in head and neck - appears to be myoclonic jerks related to hypoxic brain injury - as discussed with pt mom, no specific meds for this on antispasticity meds , consioder benzo although with pt's SUD would avoid this class - EEG reviewed and was negative for seizure.  -continue baclofen at 20mg  HS  18. Cervical extensor weakness: tizanidine decreased to 2mg  BID  19. Bowel and bladder incontinence: continue bowel and bladder program. Conitnue  magneisum supplement -04/14/23 pt requesting laxative, had BM yesterday type 4, will add dulcolax QD PRN but advised she give it some time today to see if she has a BM on her own, cont miralax daily as well.  -04/15/23 LBM today, cont regimen  20. Fatigue: improved, discussed with team may be secondary to anti-spasticity medications, B12 reviewed and is 335, in suboptimal range, will give 1,000U B12 injection 8/7, will order monthly as needed, metanx started, increase to BID  -improved 11/29  21. MRSA nares positive: negative on repeat, precautions d/ced  22. Tachycardic: resolved, d/c propanolol.  Continue Tizanidine increased to 6mg  TID>> now down to 2mg  TID. Resolved, amantadine increased back to 100mg  BID, continue this dose, resolved  23. S/p fall: CT head reviewed and is stable  24. Bilateral foot drop: bilateral AFOs ordered, discussed that these can help her to walk better  25. Vaginal itching: completed fluconazole. Having some discharge now. See below  26. >100k E Coli UTI  -11/28 sensitive to amoxil started 11/26--continue for one week  -11/29 sx much improved      LOS: 148 days A FACE TO FACE EVALUATION WAS PERFORMED  [address] 04/15/2023, 8:22 AM

## 2023-04-16 NOTE — Progress Notes (Signed)
Occupational Therapy Session Note  Patient Details  Name: Nicole [name] MRN: [id number] Date of Birth: Aug 31, 1975  Today's Date: 04/16/2023 OT Individual Time: 1047-1200 OT Individual Time Calculation (min): 73 min    Short Term Goals: Week 1:  OT Short Term Goal 1 (Week 1): Week 19:  Patient will stand at sink with min assist long enough for helper to pull up pants OT Short Term Goal 1 - Progress (Week 1): Progressing toward goal OT Short Term Goal 2 (Week 1): Week 19:  Patient will choose clothing items from drawer without cueing once placed in front of dresser OT Short Term Goal 2 - Progress (Week 1): Met OT Short Term Goal 3 (Week 1): Week 19:  Patient will don right sock on foot with min assist for trunk control OT Short Term Goal 3 - Progress (Week 1): Progressing toward goal OT Short Term Goal 4 (Week 1): Week 19:  Patient will utilize external cues to orient herself to time/ date  Skilled Therapeutic Interventions/Progress Updates:   Greeted pt who was up in w/c reporting " I had a shower already this morning with Kynleigh this morning". OT verified and pt accurate. RO and pt able to identify she needs a new calendar for Dec. Pt transported via w/c off then back on unit for time management. Intermittent short distance w/c skills training in wide open spaces 20 ft x 3 intervals during session with increased time and cues and demo for L UE integration. Standing in gift shop for simple unilateral reach R/L 4 intervals of 2 min durations with CGA and RW support. Functional amb from and back to w/c to scan 2 shelves for "soft fuzzy blanket" pt was interested in finding. Stood for reaching with R UE to read 3 greeting cards. Once back up in room, pt declined need for use of restroom for toileting. OT demonstrated then pt mirrored AAROM for B UE scap, sh, elbow wrist and hand 5 reps with cues and light facilitation for end ranges. Left pt w/c level with chair alarm active, needs and nurse call  button in reach.   Pain: L ankle pain 3/10 post amb with CAM boot in place, elevation and rest for relief   Therapy Documentation Precautions:  Precautions Precautions: Fall Precaution Comments: delayed processing, Increased tone UB and LB Required Braces or Orthoses: Other Brace Other Brace: B adjustable night splints; B elbow "cosey" splints; L palm protector; R mitt Restrictions Weight Bearing Restrictions: No   Therapy/Group: Individual Therapy  Vicenta Dunning 04/16/2023, 7:58 AM

## 2023-04-16 NOTE — Progress Notes (Signed)
Physical Therapy Session Note  Patient Details  Name: Nicole [name] MRN: [id number] Date of Birth: 26-Oct-1975  Today's Date: 04/16/2023 PT Individual Time: 0902-0931, 1400-1430 PT Individual Time Calculation (min): 29 min, 30 min   Short Term Goals: Week 9: PT Short Term Goal 1 (Week 9): Pt will initiate bed mobility with totalA PT Short Term Goal 2 (Week 9): Pt will maintain unsupported sitting balance for 30 seconds without LOB PT Short Term Goal 3 (Week 9): Pt will complete bed<>chair transfers with no more than totalA  Skilled Therapeutic Interventions/Progress Updates:   Treatment Session 1  Pt seated on shower chair with tech in room getting shower upon arrival. Pt agreeable to therapy. Pt denies any pain.   Pt requesting to take a shower. Pt pt washed hair and body with unilateral UE support on grab bar, and min-mod A for postural control with assist for pericare, buttocks, back and legs and rinsing, Verbal and tactile cues provided for upright posture and posterior pelvic tilt.   Therapist dried off patient with total A to donn L LE CAM boot and R LE tennis shoe with total A. Pt performed stand pivot transfer shower chair to [facility] with use of B UE on grab bar and min A, verbal cues provided for technique. Pt  performed sit to stand with min A while patient used R UE to dry buttocks and pericare. Pt donned shirt while seated in WC with min A for L LE, verbal cues provided for technique.   Pt seated in [facility] with tech in room to assist with remainder of getting dressed.     Treatment Session 2   Pt received toileting with tech with steady. Therapist assisted pt with dependent steady transfer to [facility]. Pt ambulated 1x67, 2x60 feet with RW and min-mod A with fatigue, verbal cues provided for upright posture, safety with RW, and increased L hip and knee flexion, and L LE step length.    Pt seated in WC at end of session with all needs within reach and chair alarm on with pt mom in room.      Therapy Documentation Precautions:  Precautions Precautions: Fall Precaution Comments: delayed processing, Increased tone UB and LB Required Braces or Orthoses: Other Brace Other Brace: B adjustable night splints; B elbow "cosey" splints; L palm protector; R mitt Restrictions Weight Bearing Restrictions: No  Therapy/Group: Individual Therapy  [facility] [place], [place]ace, DPT  04/16/2023, 7:58 AM

## 2023-04-16 NOTE — Progress Notes (Signed)
Physical Therapy Weekly Progress Note  Patient Details  Name: Nicole [name] MRN: [id number] Date of Birth: 1976-02-24  Beginning of progress report period: April 06, 2023 End of progress report period: April 16, 2023   Ms. Hehir is making great progress this past reporting period. She is currently modA for bed mobility, min to modA for stand pivot transfers, minA for short distance (65ft) gait and modA for longer distance (>35ft-75ft) using the youth sized RW. She requires maxA for stair navigation. She continues to show improvement in functional mobility, cognition, awareness, and posture.   Patient continues to demonstrate the following deficits muscle weakness and muscle joint tightness, decreased cardiorespiratoy endurance, abnormal tone, unbalanced muscle activation, motor apraxia, and decreased coordination, decreased initiation, decreased attention, decreased awareness, decreased problem solving, decreased safety awareness, decreased memory, and delayed processing, and decreased sitting balance, decreased standing balance, decreased postural control, hemiplegia, and decreased balance strategies and therefore will continue to benefit from skilled PT intervention to increase functional independence with mobility.  Patient progressing toward long term goals..  Continue plan of care.  PT Short Term Goals Week 1:  PT Short Term Goal 1 (Week 1): Pt will improve bed mobility to minA consistently PT Short Term Goal 2 (Week 1): Pt will complete functional outcome measure to assess balance and falls risk PT Short Term Goal 3 (Week 1): pt will ambulate 171ft with modA and LRAD  Therapy Documentation Precautions:  Precautions Precautions: Fall Precaution Comments: delayed processing, Increased tone UB and LB Required Braces or Orthoses: Other Brace Other Brace: B adjustable night splints; B elbow "cosey" splints; L palm protector; R mitt Restrictions Weight Bearing Restrictions:  No General:     Therapy/Group: Individual Therapy  Marquest Gunkel P Truth Barot 04/16/2023, 8:16 AM

## 2023-04-16 NOTE — Progress Notes (Signed)
Patient ID: Nicole [name], female   DOB: 1975-08-22, 47 y.o.   MRN: [id number]  Attempting to find out if a SSD worker is assigned to pt and how much longer will need to determine her disability. Have left message with Katlen Lagnese at [facility] who took the application and also contacted [employer] was on hold for 45 minutes then hung up on. Mom has not gotten any information in the mail from [facility]. Mom reports she is not able to provide care to pt. Will await return call from [facility] and try again to call SSD

## 2023-04-16 NOTE — Progress Notes (Signed)
PROGRESS NOTE   Subjective/Complaints: No new complaints this morning  Showering  Patient's chart reviewed- No issues reported overnight Vitals signs stable   ROS: as per HPI. Denies CP, SOB, abd pain, N/V/D/C, or any other complaints at this time.  .     Objective:   No results found. No results for input(s): "WBC", "HGB", "HCT", "PLT" in the last 72 hours.      No results for input(s): "NA", "K", "CL", "CO2", "GLUCOSE", "BUN", "CREATININE", "CALCIUM" in the last 72 hours.       Intake/Output Summary (Last 24 hours) at 04/16/2023 1058 Last data filed at 04/16/2023 0700 Gross per 24 hour  Intake 652 ml  Output --  Net 652 ml       Pressure Injury 02/09/23 Heel Left Stage 3 -  Full thickness tissue loss. Subcutaneous fat may be visible but bone, tendon or muscle are NOT exposed. (Active)  02/09/23 0543  Location: Heel  Location Orientation: Left  Staging: Stage 3 -  Full thickness tissue loss. Subcutaneous fat may be visible but bone, tendon or muscle are NOT exposed.  Wound Description (Comments):   Present on Admission: No    Physical Exam: Vital Signs Blood pressure 97/64, pulse 66, temperature 98.4 F (36.9 C), resp. rate 18, height 5\' 1"  (1.549 m), weight 54.3 kg, SpO2 97%.   Constitutional: No distress . Vital signs reviewed. Resting in bed.  HEENT: NCAT, EOMI, oral membranes moist, glasses donned Neck: supple Cardiovascular: RRR without murmur. No JVD    Respiratory/Chest: CTA Bilaterally without wheezes or rales. Normal effort    GI/Abdomen: soft, BS +, non-tender, non-distended Ext: no clubbing, cyanosis, or edema Psych: pleasant and cooperative Neuro: speech clearer today  Skin: + Scaling dried skin on bilateral heels left buttock unstageable - not examined + Left heel stage III DTI, covered in foam dressing  Neurologic: AAOx2,  to year but not month or date. Follows most simple  commands.  + Hypophonation, speech intelligability much improved MAS 1 right elbow,MAS  tr to 1 in RIght finger and wrist flexors  MAS 1 in extensors of LE's MAS 1 L elbow , MAS 1+ in L wrist and finger flexors. Hypersensitivity to left hand improved, +left hand is not contracted, Left sided strength improved, decreased range of motion and pain at end range of motion in left shoulder--stable 12/2  Musculoskeletal: limited bilateral shoulder and elbow ROM due to increased tone ,pain with palpation of right shoulder--unchanged    Assessment/Plan: 1. Functional deficits which require 3+ hours per day of interdisciplinary therapy in a comprehensive inpatient rehab setting. Physiatrist is providing close team supervision and 24 hour management of active medical problems listed below. Physiatrist and rehab team continue to assess barriers to discharge/monitor patient progress toward functional and medical goals  Care Tool:  Bathing    Body parts bathed by patient: Face, Abdomen, Chest, Left upper leg, Right upper leg, Left arm, Front perineal area, Right arm, Right lower leg   Body parts bathed by helper: Buttocks, Left lower leg Body parts n/a: Right arm, Left arm, Front perineal area, Buttocks, Right upper leg, Left upper leg, Right lower leg, Left lower leg  Bathing assist Assist Level: Minimal Assistance - Patient > 75%     Upper Body Dressing/Undressing Upper body dressing   What is the patient wearing?: Pull over shirt    Upper body assist Assist Level: Set up assist    Lower Body Dressing/Undressing Lower body dressing      What is the patient wearing?: Incontinence brief, Pants     Lower body assist Assist for lower body dressing: Moderate Assistance - Patient 50 - 74%     Toileting Toileting Toileting Activity did not occur Press photographer and hygiene only): N/A (no void or bm)  Toileting assist Assist for toileting: Maximal Assistance - Patient 25 - 49%      Transfers Chair/bed transfer  Transfers assist  Chair/bed transfer activity did not occur: Safety/medical concerns  Chair/bed transfer assist level: Moderate Assistance - Patient 50 - 74%     Locomotion Ambulation   Ambulation assist   Ambulation activity did not occur: Safety/medical concerns  Assist level: Moderate Assistance - Patient 50 - 74% Assistive device: Walker-rolling Max distance: 14ft   Walk 10 feet activity   Assist  Walk 10 feet activity did not occur: Safety/medical concerns  Assist level: Moderate Assistance - Patient - 50 - 74% Assistive device: Walker-rolling   Walk 50 feet activity   Assist Walk 50 feet with 2 turns activity did not occur: Safety/medical concerns         Walk 150 feet activity   Assist Walk 150 feet activity did not occur: Safety/medical concerns         Walk 10 feet on uneven surface  activity   Assist Walk 10 feet on uneven surfaces activity did not occur: Safety/medical concerns         Wheelchair     Assist Is the patient using a wheelchair?: Yes Type of Wheelchair: Manual    Wheelchair assist level: Dependent - Patient 0% Max wheelchair distance: 5ft    Wheelchair 50 feet with 2 turns activity    Assist    Wheelchair 50 feet with 2 turns activity did not occur: Safety/medical concerns   Assist Level: Dependent - Patient 0%   Wheelchair 150 feet activity     Assist  Wheelchair 150 feet activity did not occur: Safety/medical concerns   Assist Level: Dependent - Patient 0%   Blood pressure 97/64, pulse 66, temperature 98.4 F (36.9 C), resp. rate 18, height 5\' 1"  (1.549 m), weight 54.3 kg, SpO2 97%.  Medical Problem List and Plan: 1. 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 Awaiting approval of disability. SW has been working tirelessly with TOC/others on placement. Disability  remains sticking point Code status  DNR -Continue CIR therapies including PT, OT, and SLP     2.  Impaired mobility: continue Lovenox 30mg  daily, weekly creatinine ordered             -antiplatelet therapy: N/A  3. Pain:  -Knee pain: Tylenol as needed, continue voltaren gel scheduled. XR reviewed and is stable, discussed with therapy -Right wrist swelling: XR ordered and shows no acute fracture, shows 2.39mm ulnar variance,  brace removed to given patient a break from it. Voltaren gel ordered prn for discomfort, continue, CT ordered and show no acute osseus injury, continue ace wrap   -Foot pain: tramadol ordered prn, improved today, continue prn  -Headaches: continue tylenol and tramadol prn -Low back pain: continue kpad, asked [place] to please bring for her -Whole body hypersensitivity: gabapentin 100mg  TID ordered -Pain in left heel: XR obtained and negative, cam boot ordered to help offload heel with therapy, discussed that this has helped -R shoulder pain: XR neg for acute etiology on 04/06/23, voltaren gel ordered--encourage ROM as tolerated  4. History of anxiety: discussed with her mom that she feels this was a big risk factor for her accident, propanolol d/ced due to hypotension. Continue magnesium supplement  5. Neuropsych/cognition: This patient is not capable of making decisions on her own behalf  - 10/12: Still not capable of medical decision making; BUT oriented and communicating, following directions - MUCH improved - PRN melatonin 3mg    for insomnia  6. Skin: -Buttock/sacrum--foam dressing, pressure relief, nutrition -stage 3 left heel, continue foam dressing/ pressure relief  7. Fluids/Electrolytes/Nutrition: Routine in and  outs with follow-up chemistries  -Eating  well with cueing   -Dysphagia: continue D3/thins--tolerating this diet -Low protein stores: d/c prosource since patient hates these and is eating 100% of meals -Suboptimal vitamin D level: increase to 5,000 U daily. continue this dose  -History of magnesium deficiency: magnesium 500mg  stopped to support BP  8.  Cavitary right lower lobe pneumonia likely aspiration pneumonia/MRSA pneumonia.  Resolved    Latest Ref Rng & Units 04/04/2023    6:09 AM 02/05/2023   11:12 AM 01/22/2023    6:36 AM  CBC  WBC 4.0 - 10.5 K/uL 5.6  8.5  8.1   Hemoglobin 12.0 - 15.0 g/dL 16.1  09.6  04.5   Hematocrit 36.0 - 46.0 % 42.1  46.7  47.5   Platelets 150 - 400 K/uL 224  284  274      9.  History of drug abuse.  Positive cocaine on urine drug screen.  Provide counseling  10.  AKI/hypovolemia and ATN.  Resolved Appears to have fair fluid intake, creatinine has been stable  -recheck labs Monday      Latest Ref Rng & Units 04/04/2023    6:09 AM 04/02/2023    3:39 PM 03/30/2023    5:35 AM  BMP  Glucose 70 - 99 mg/dL 91  85    BUN 6 - 20 mg/dL 20  17    Creatinine 4.09 - 1.00 mg/dL 8.11  9.14  7.82   Sodium 135 - 145 mmol/L 138  136    Potassium 3.5 - 5.1 mmol/L 3.9  4.1    Chloride 98 - 111 mmol/L 105  100    CO2 22 - 32 mmol/L 24  26    Calcium 8.9 - 10.3 mg/dL 9.3  8.8      11.  Mild transaminitis with rhabdomyolysis.  Both resolved  12.  Hypotension. Resolved, d/c midodrine -d/c propanolol, decrease baclofen to 10mg  HS>> back up to 20mg  QHS, decrease tizanidine to 2mg  TID 11/2- BP was running 87 systolic last night- and max of 110 in last 24 hours- not dizzy per chart, but if develops any dizziness, suggest primary team restarts Midodrine. Amantadine can cause hypotension, FYI Decrease tizanidine to BID  -04/11/23 bp's still soft-no change with TID tizanidine 11/29 magnesium stopped. BP's have been low regardless of meds. Pt denies any  dizziness/light-headedness. Continue to observe. -11/30-12/1 BPs soft but stable, monitor Vitals:   04/11/23 2128 04/12/23 0424 04/12/23 1329 04/12/23 1931  BP: (!) 84/58 95/60 97/70  (!) 84/49   04/13/23 0510 04/13/23 1925 04/14/23 0413 04/14/23 1314  BP: 98/63 94/71 (!) 90/55 (!) 88/55   04/14/23 2021 04/15/23 1401 04/15/23 1947 04/16/23 0508  BP: 103/70 96/63 101/64 97/64   13. HTN: see above Increase tizanidine to 6mg  TID--now 4mg  TID>>2mg  TID.    - 14. Impaired initiation: continue amantadine 100mg  BID  15. Hyperglycemia: CBGs mildly elevated, d/ced checks. Prosource d/ced as per patient's request  16. Spasticity:  -discussed serial casting with OT and her mom but she defers given pain she had when this was attempted -baclofen and tizanidine decreased due to hypotension, spasticity has been stable. Continue wrist, elbow brace, and PRAFOs. Ordered a night splint for LLE  Repeat CMP today given chronic tizanidine use -9/7 continues to have significant tone despite use of tizanidine and baclofen, may be beneficial consider baclofen pump in outpatient setting if this causes significant difficulties in care  11/29 currently on tizanidine 2mg  tid. Won't increase further because tone has improved (perhaps in part d/t UTI) and  bp won't support more. Will not decrease tizanidine as BP really hasn't changed since we moved it to TID  17.  Dyskinetic movements in head and neck - appears to be myoclonic jerks related to hypoxic brain injury - as discussed with pt mom, no specific meds for this on antispasticity meds , consioder benzo although with pt's SUD would avoid this class - EEG reviewed and was negative for seizure.  -continue baclofen at 20mg  HS  18. Cervical extensor weakness: tizanidine decreased to 2mg  BID  19. Bowel and bladder incontinence: continue bowel and bladder program. Conitnue magneisum supplement D/c miralax  20. Fatigue: improved, discussed with team may be secondary to  anti-spasticity medications, B12 reviewed and is 335, in suboptimal range, will give 1,000U B12 injection 8/7, will order monthly as needed, metanx started, increase to BID, continue  21. MRSA nares positive: negative on repeat, precautions d/ced  22. Tachycardic: resolved, d/c propanolol.  Continue Tizanidine increased to 6mg  TID>> now down to 2mg  TID. Resolved, amantadine increased back to 100mg  BID, continue this dose, resolved  23. S/p fall: CT head reviewed and is stable  24. Bilateral foot drop: bilateral AFOs ordered, discussed that these can help her to walk better  25. Vaginal itching: completed fluconazole. Having some discharge now. See below  26. >100k E Coli UTI  -11/28 sensitive to amoxil started 11/26--continue for 1 week      LOS: 149 days A FACE TO FACE EVALUATION WAS PERFORMED  Clint Bolder P Adwoa Axe 04/16/2023, 10:58 AM

## 2023-04-16 NOTE — Progress Notes (Signed)
Speech Language Pathology Daily Session Note  Patient Details  Name: Nicole [name] MRN: [id number] Date of Birth: 1975/10/21  Today's Date: 04/16/2023 SLP Individual Time: 1447-1530 SLP Individual Time Calculation (min): 43 min  Short Term Goals: Week 9: SLP Short Term Goal 1 (Week 9): Week 21 - Pt will solve basic environmental problems with 90% accuracy given min assist. SLP Short Term Goal 2 (Week 9): Week 21 -Patient will utilize external aids for orientation to place and time with supervision level verbal cues SLP Short Term Goal 3 (Week 9): Week 21 - Pt will demonstrate sustained attention to basic tasks for 30 minutes with supervision A multimodal cues for redirection. SLP Short Term Goal 4 (Week 9): Week 21 - Pt will utilize increased vocal intensity w/ min verbal cues to remain 90% intelligibile at conversation level  Skilled Therapeutic Interventions:  Patient was seen in PM to address cognitive re- training. Pt was alert and seated upright in WC upon SLP arrival. She was agreeable for session. Pt oriented to temporal concepts given external aid. She recalled information in 2/3 opportunities across remainder of session. She was indep oriented to location throughout session. SLP addressed basic problem solving through a structured matching task. Given pictures of common objects, pt challenged to identify a complimentary object. Pt completed task with 60% acc indep improving to 100% acc with min A. In additional minutes of session, SLP addressed vocal intensity through structured task. She required min A throughout task to maintain vocal intensity achieving 85-90% intelligibility. At conclusion of session, pt was left upright in [facility] with call button within reach. SLP to continue POC.   Pain Pain Assessment Pain Scale: 0-10 Pain Score: 6  Faces Pain Scale: No hurt Pain Type: Acute pain Pain Location: Back Pain Descriptors / Indicators: Aching Pain Frequency: Intermittent Pain Onset:  Gradual Patients Stated Pain Goal: 1 Pain Intervention(s): Medication (See eMAR)  Therapy/Group: Individual Therapy  [name] 04/16/2023, 3:49 PM

## 2023-04-16 NOTE — Progress Notes (Signed)
Physical Therapy Session Note  Patient Details  Name: Nicole [name] MRN: [id number] Date of Birth: 30-Apr-1976  Today's Date: 04/16/2023 PT Individual Time: 1000-1042 PT Individual Time Calculation (min): 42 min   Short Term Goals: Week 1:  PT Short Term Goal 1 (Week 1): Pt will ambulate 58ft with modA and LRAD PT Short Term Goal 1 - Progress (Week 1): Not met PT Short Term Goal 2 (Week 1): Pt will complete functional outcome measure to assess balance and falls risk PT Short Term Goal 2 - Progress (Week 1): Not met PT Short Term Goal 3 (Week 1): Week 13: Pt will complete bed<>chair transfers with totalA (pt <25%) PT Short Term Goal 3 - Progress (Week 1): Not met  Skilled Therapeutic Interventions/Progress Updates:      Pt sitting in wheelchair on arrival - agreeable to therapy treatment. Has no c/o resting pain. Is wearing her L CAM boot on throughout session.   Transported to day room rehab gym and assisted onto Nustep with modA squat pivot transfer. SetupA needed for both LE's. She completed x8.5 minutes at L5 resistance using all x4 extremities, working on reciprocal movement and AAROM in shoulders and knees. Needed frequent adjustment of her L foot as it tends to slide off foot plate and has trouble keeping her L heel down due to limited ankle ROM in the CAM boot.   Gait training ~3ft + ~58ft with min/modA and RW - cues for upright posture, keeping body within walker frame, lengthening L step height/length, and improving heel strike bilaterally. She can ambulate the first ~81ft with minA and needs modA thereafter due to fatigue.   Positioned her into supine position on mat table to work on stretching into extension for BLE and her neck. Instructed her to count in 60 second intervals to help calm her cervical flexion which helped, also cued her to "count ceiling tiles" to help her attention.   Returned to her room at end of treatment. Left sitting upright in w/c with all needs met and  chair pad alarm on.    Therapy Documentation Precautions:  Precautions Precautions: Fall Precaution Comments: delayed processing, Increased tone UB and LB Required Braces or Orthoses: Other Brace Other Brace: B adjustable night splints; B elbow "cosey" splints; L palm protector; R mitt Restrictions Weight Bearing Restrictions: No General:      Therapy/Group: Individual Therapy  Lysette Lindenbaum P Kareen Hitsman 04/16/2023, 7:45 AM

## 2023-04-17 MED ORDER — TIZANIDINE HCL 4 MG PO TABS
2.0000 mg | ORAL_TABLET | Freq: Two times a day (BID) | ORAL | Status: DC
  Administered 2023-04-17: 2 mg via ORAL
  Filled 2023-04-17: qty 1

## 2023-04-17 NOTE — Progress Notes (Signed)
Occupational Therapy Session Note  Patient Details  Name: Nicole [name] MRN: [id number] Date of Birth: May 29, 1975  Today's Date: 04/17/2023 OT Individual Time: 1007-1045 OT Individual Time Calculation (min): 38 min    Short Term Goals: Week 1:  OT Short Term Goal 1 Week 19:  Patient will stand at sink with min assist long enough for helper to pull up pants OT Short Term Goal 2 Week 19:  Patient will choose clothing items from drawer without cueing once placed in front of dresser OT Short Term Goal 3 Week 19:  Patient will don right sock on foot with min assist for trunk control OT Short Term Goal 4 Week 19:  Patient will utilize external cues to orient herself to time/ date  Skilled Therapeutic Interventions/Progress Updates:  Skilled OT intervention completed with focus on w/c mobility, sit > stands and dynamic standing balance. Pt received seated in w/c requesting to "break out of the room". Pt agreeable to session. No pain reported.  Pt declined self-care needs. Pt self-propelled with min A about 100 ft to address BUE coordination, however pt with difficulty with turns despite max cues.   Pt participated in the following dynamic standing balance and endurance tasks to promote independence and safety during BADLs and functional mobility: -retrieving squigz from long mirror x3 trials, 2 with RUE and 1 with LUE. CGA/min A sit > stand with youth RW, and up to mod A initially needed for balance due to posterior lean as pt greatly focused on task vs correcting balance to prevent falling over. Assist faded to CGA/min A on 2nd trial with RUE, however increased difficulty when using LUE as pt increasingly frustrated by weaker grasp with Lt hand. Transitioned to seated position for full focus on grasping with Lt hand and pt with increased success with ++ time  Transported dependently in w/c back to room. Pt remained seated in w/c, with belt alarm on/activated, and with all needs in reach at end of  session.   Therapy Documentation Precautions:  Precautions Precautions: Fall Precaution Comments: delayed processing, Increased tone UB and LB Required Braces or Orthoses: Other Brace Other Brace: B adjustable night splints; B elbow "cosey" splints; L palm protector; R mitt Restrictions Weight Bearing Restrictions: No    Therapy/Group: Individual Therapy  Melvyn Novas, MS, OTR/L  04/17/2023, 12:33 PM

## 2023-04-17 NOTE — Consult Note (Addendum)
WOC Nurse wound follow up Refer to previous consult notes; left heel previously with a stage 3 pressure injury; now it has healed with small amt dried scab. Continue foam dressing to protect from further injury. WOC team will not plan to follow further; please re-consult if further assistance is needed.  Thank-you,  Cammie Mcgee MSN, RN, CWOCN, [place], CNS [phone]

## 2023-04-17 NOTE — Progress Notes (Signed)
Physical Therapy Session Note  Patient Details  Name: Nicole [name] MRN: [id number] Date of Birth: Aug 12, 1975  Today's Date: 04/17/2023 PT Individual Time: 9629-5284 + 1324-4010 PT Individual Time Calculation (min): 41 min  + 42 min  Short Term Goals: Week 1:  PT Short Term Goal 1 (Week 1): Pt will improve bed mobility to minA consistently PT Short Term Goal 1 - Progress (Week 1): Not met PT Short Term Goal 2 (Week 1): Pt will complete functional outcome measure to assess balance and falls risk PT Short Term Goal 2 - Progress (Week 1): Not met PT Short Term Goal 3 (Week 1): pt will ambulate 151ft with modA and LRAD PT Short Term Goal 3 - Progress (Week 1): Not met  Skilled Therapeutic Interventions/Progress Updates:      1st session: Pt in bed to start - lying sideways in the bed awkardly positioned. Pt aware when asked but says she has difficulty repositioning herself with the [facility] is elevated. Assist for repositioning. Also noted strong smell of urine - pt with incontinent episode. Assist for brief change and completed UB/LB dressing at [facility] level. Supine<>sitting EOB with min/modA for initiation and BLE/trunk support. Able to sit unsupported at EOB with supervision. Donned socks, tennis shoe to R foot and CAM boot to her L foot with totalA for time. Completed stand pivot transfer with minA using B HHA to encourage weight bearing and functional independence with transfers. Wheeled to main rehab gym. Gait training 2x32ft with minA for the first ~40ft and modA for the remaining distances - using youth sized RW. Cues for increasing L step length/height, postural awareness, and keeping her body within walker frame. Added x2 turns per ambulation to work on [employer] and turning. Asked if patient would like to try ambulating without the CAM boot but she defers that at this time. Continued to work on postural extension by having her roll a large beach ball up/down the wall with min/modA for standing  balance and max cues for sequencing due to some apraxia. Pt able to sustain grip to ball but had limited shoulder ROM for elevation and extension. Pt returned to her room and left sitting upright at end of session. Her breakfast arrived and setupA provided. NT made aware. All needs met, safety belt alarm on.     2nd session: Pt sitting in wheelchair on arrival - agreeable to therapy treatment. Has no c/o pain. Wearing L CAM boot.   Transported to day room rehab gym. Sit<>Stand to RW at [facility] level - she still tucks her R leg far back underneath her but less painful compared to before. Instructed in item retrieval with bright orange cones setup around the gym space. Pt needing min cues for locating some of them due downward gaze and posture. She needed min/modA for gait while retrieving the cones, including some turns, backward stepping, and reaching outside BOS. She even was able to reach for cones at ground level with modA due to prevent "sitting" on the ground.  Instructed in corn bag toss to board with minA for balance and RW for support. Worked on taking a step forward/backward while tossing to challenge balance. She was then instructed to pick up x5 bags from the board level with min/modA and RW - knees buckle when she bends down to pick up object from floor.   Returned to her room and she was left sitting upright in wheelchair with seat belt alarm on and call bell within reach. All needs met  at the end.       Therapy Documentation Precautions:  Precautions Precautions: Fall Precaution Comments: delayed processing, Increased tone UB and LB Required Braces or Orthoses: Other Brace Other Brace: B adjustable night splints; B elbow "cosey" splints; L palm protector; R mitt Restrictions Weight Bearing Restrictions: No General:    Therapy/Group: Individual Therapy  Nicole [name] Nicole [name] 04/17/2023, 7:45 AM

## 2023-04-17 NOTE — Progress Notes (Signed)
Occupational Therapy Session Note  Patient Details  Name: Nicole [name] MRN: [id number] Date of Birth: 07-May-1976  Today's Date: 04/17/2023 OT Individual Time: 1191-4782 OT Individual Time Calculation (min): 73 min    Short Term Goals: Week 1:  OT Short Term Goal 1 Week 19:  Patient will stand at sink with min assist long enough for helper to pull up pants OT Short Term Goal 2 Week 19:  Patient will choose clothing items from drawer without cueing once placed in front of dresser OT Short Term Goal 3 Week 19:  Patient will don right sock on foot with min assist for trunk control OT Short Term Goal 4 Week 19:  Patient will utilize external cues to orient herself to time/ date  Skilled Therapeutic Interventions/Progress Updates:     Pt received returning from restroom with nursing staff, perched sitting in stedy upon OT arrival. Pt presenting to be in good spirits receptive to skilled OT session reporting 6/10 pain in back- OT offering intermittent rest breaks, repositioning, and therapeutic support to optimize participation in therapy session. Pt requesting to take shower during session for pain management to apply warm water to her back and to get clean before returning to bed. Focus this session functional cognition, dynamic balance, and BADL retraining in context of shower. Pt completed functional mobility to BR and transferred to padded shower seat positioned in walk-in shower with min A for balance and and RW management. Pt doffed shirt in seated position with min A and mod verbal cues for technique. Stood while Energy manager bars with CGA while OT doffed pants from waist. Once seated on TTB, Pt able to complete UB bathing with CGA overall and mod verbal cues for sequencing and recall of previously washed body parts. Pt completed LB bathing in seated position with close supervision to wash upper/lower BLEs and stood while holding grab bar with min A while OT assisted with washing peri-areas. OT  assisted with washing feet and her back. Following shower, Pt completed functional mobility to her room using RW min A for balance and RW management. Pt able to donn shirt sitting EOB with CGA and min verbal cues provided for orientation. Donned pants with mod A EOB with assistance required to weave R LE and bring pant to waist in standing using RW. Pt did attempt to bring pants to waist, however assistance required d/t tight fit of leggings. Donned L Pt returned to bed with mod A to bring B LEs into bed.Pt was left resting in bed with call bell in reach, bed alarm on, and all needs met.    Therapy Documentation Precautions:  Precautions Precautions: Fall Precaution Comments: delayed processing, Increased tone UB and LB Required Braces or Orthoses: Other Brace Other Brace: B adjustable night splints; B elbow "cosey" splints; L palm protector; R mitt Restrictions Weight Bearing Restrictions: No  Therapy/Group: Individual Therapy  [name] 04/17/2023, 8:04 AM

## 2023-04-17 NOTE — Progress Notes (Signed)
PROGRESS NOTE   Subjective/Complaints: No new complaints this morning Stage 3 pressure injury to left heel has healed No issues overnight  ROS: as per HPI. Denies CP, SOB, abd pain, N/V/D/C, or any other complaints at this time.  .     Objective:   No results found. No results for input(s): "WBC", "HGB", "HCT", "PLT" in the last 72 hours.      No results for input(s): "NA", "K", "CL", "CO2", "GLUCOSE", "BUN", "CREATININE", "CALCIUM" in the last 72 hours.       Intake/Output Summary (Last 24 hours) at 04/17/2023 0946 Last data filed at 04/16/2023 1800 Gross per 24 hour  Intake 355 ml  Output --  Net 355 ml           Physical Exam: Vital Signs Blood pressure 92/60, pulse 69, temperature 98.6 F (37 C), resp. rate 16, height 5\' 1"  (1.549 m), weight 54.3 kg, SpO2 98%.   Constitutional: No distress . Vital signs reviewed. Resting in bed.  HEENT: NCAT, EOMI, oral membranes moist, glasses donned Neck: supple Cardiovascular: RRR without murmur. No JVD    Respiratory/Chest: CTA Bilaterally without wheezes or rales. Normal effort    GI/Abdomen: soft, BS +, non-tender, non-distended Ext: no clubbing, cyanosis, or edema Psych: pleasant and cooperative Neuro: speech clearer today  Skin: + Scaling dried skin on bilateral heels left buttock unstageable - not examined + Left heel stage III DTI has healed  Neurologic: AAOx2,  to year but not month or date. Follows most simple commands.  + Hypophonation, speech intelligability much improved MAS 1 right elbow,MAS  tr to 1 in RIght finger and wrist flexors  MAS 1 in extensors of LE's MAS 1 L elbow , MAS 1+ in L wrist and finger flexors. Hypersensitivity to left hand improved, +left hand is not contracted, Left sided strength improved, decreased range of motion and pain at end range of motion in left shoulder--stable 12/2  Musculoskeletal: limited bilateral shoulder  and elbow ROM due to increased tone ,pain with palpation of right shoulder--unchanged    Assessment/Plan: 1. Functional deficits which require 3+ hours per day of interdisciplinary therapy in a comprehensive inpatient rehab setting. Physiatrist is providing close team supervision and 24 hour management of active medical problems listed below. Physiatrist and rehab team continue to assess barriers to discharge/monitor patient progress toward functional and medical goals  Care Tool:  Bathing    Body parts bathed by patient: Face, Abdomen, Chest, Left upper leg, Right upper leg, Left arm, Front perineal area, Right arm, Right lower leg   Body parts bathed by helper: Buttocks, Left lower leg Body parts n/a: Right arm, Left arm, Front perineal area, Buttocks, Right upper leg, Left upper leg, Right lower leg, Left lower leg   Bathing assist Assist Level: Minimal Assistance - Patient > 75%     Upper Body Dressing/Undressing Upper body dressing   What is the patient wearing?: Pull over shirt    Upper body assist Assist Level: Set up assist    Lower Body Dressing/Undressing Lower body dressing      What is the patient wearing?: Incontinence brief, Pants     Lower body assist Assist for  lower body dressing: Moderate Assistance - Patient 50 - 74%     Toileting Toileting Toileting Activity did not occur Press photographer and hygiene only): N/A (no void or bm)  Toileting assist Assist for toileting: Maximal Assistance - Patient 25 - 49%     Transfers Chair/bed transfer  Transfers assist  Chair/bed transfer activity did not occur: Safety/medical concerns  Chair/bed transfer assist level: Moderate Assistance - Patient 50 - 74%     Locomotion Ambulation   Ambulation assist   Ambulation activity did not occur: Safety/medical concerns  Assist level: Moderate Assistance - Patient 50 - 74% Assistive device: Walker-rolling Max distance: 97ft   Walk 10 feet  activity   Assist  Walk 10 feet activity did not occur: Safety/medical concerns  Assist level: Moderate Assistance - Patient - 50 - 74% Assistive device: Walker-rolling   Walk 50 feet activity   Assist Walk 50 feet with 2 turns activity did not occur: Safety/medical concerns         Walk 150 feet activity   Assist Walk 150 feet activity did not occur: Safety/medical concerns         Walk 10 feet on uneven surface  activity   Assist Walk 10 feet on uneven surfaces activity did not occur: Safety/medical concerns         Wheelchair     Assist Is the patient using a wheelchair?: Yes Type of Wheelchair: Manual    Wheelchair assist level: Dependent - Patient 0% Max wheelchair distance: 34ft    Wheelchair 50 feet with 2 turns activity    Assist    Wheelchair 50 feet with 2 turns activity did not occur: Safety/medical concerns   Assist Level: Dependent - Patient 0%   Wheelchair 150 feet activity     Assist  Wheelchair 150 feet activity did not occur: Safety/medical concerns   Assist Level: Dependent - Patient 0%   Blood pressure 92/60, pulse 69, temperature 98.6 F (37 C), resp. rate 16, height 5\' 1"  (1.549 m), weight 54.3 kg, SpO2 98%.  Medical Problem List and Plan: 1. 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 Awaiting approval of disability. SW has been working tirelessly with TOC/others on placement. Disability  remains sticking point Code status  DNR -Continue CIR therapies including PT, OT, and SLP     2.  Impaired mobility: continue Lovenox 30mg  daily, weekly creatinine ordered              -antiplatelet therapy: N/A  3. Pain:  -Knee pain: Tylenol as needed, continue voltaren gel scheduled. XR reviewed and is stable, discussed with therapy -Right wrist swelling: XR ordered and shows no acute fracture, shows 2.76mm ulnar variance, brace removed to given patient a break from it. Voltaren gel ordered prn for discomfort, continue, CT ordered and show no acute osseus injury, continue ace wrap   -Foot pain: tramadol ordered prn, improved today, continue prn  -Headaches: continue tylenol and tramadol prn -Low back pain: continue kpad, asked [place] to please bring for her -Whole body hypersensitivity: gabapentin 100mg  TID ordered -Pain in left heel: XR obtained and negative,  cam boot ordered to help offload heel with therapy, discussed that this has helped -R shoulder pain: XR neg for acute etiology on 04/06/23, voltaren gel ordered--encourage ROM as tolerated  4. History of anxiety: discussed with her mom that she feels this was a big risk factor for her accident, propanolol d/ced due to hypotension. Continue magnesium supplement  5. Neuropsych/cognition: This patient is not capable of making decisions on her own behalf  - 10/12: Still not capable of medical decision making; BUT oriented and communicating, following directions - MUCH improved - PRN melatonin 3mg    for insomnia  6. Skin: -Buttock/sacrum--foam dressing, pressure relief, nutrition -stage 3 left heel, continue foam dressing/ pressure relief  7. Fluids/Electrolytes/Nutrition: Routine in and outs with follow-up chemistries  -Eating  well with cueing   -Dysphagia: continue D3/thins--tolerating this diet -Low protein stores: d/c prosource since patient hates these and is eating 100% of meals -Suboptimal vitamin D level: increase to 5,000 U daily. continue this dose  -History of magnesium deficiency: magnesium 500mg  stopped to support BP  8.  Cavitary right lower lobe pneumonia likely aspiration pneumonia/MRSA pneumonia.   Resolved    Latest Ref Rng & Units 04/04/2023    6:09 AM 02/05/2023   11:12 AM 01/22/2023    6:36 AM  CBC  WBC 4.0 - 10.5 K/uL 5.6  8.5  8.1   Hemoglobin 12.0 - 15.0 g/dL 28.4  13.2  44.0   Hematocrit 36.0 - 46.0 % 42.1  46.7  47.5   Platelets 150 - 400 K/uL 224  284  274      9.  History of drug abuse.  Positive cocaine on urine drug screen.  Provide counseling  10.  AKI/hypovolemia and ATN.  Resolved Appears to have fair fluid intake, creatinine has been stable  -recheck labs Monday      Latest Ref Rng & Units 04/04/2023    6:09 AM 04/02/2023    3:39 PM 03/30/2023    5:35 AM  BMP  Glucose 70 - 99 mg/dL 91  85    BUN 6 - 20 mg/dL 20  17    Creatinine 1.02 - 1.00 mg/dL 7.25  3.66  4.40   Sodium 135 - 145 mmol/L 138  136    Potassium 3.5 - 5.1 mmol/L 3.9  4.1    Chloride 98 - 111 mmol/L 105  100    CO2 22 - 32 mmol/L 24  26    Calcium 8.9 - 10.3 mg/dL 9.3  8.8      11.  Mild transaminitis with rhabdomyolysis.  Both resolved  12.  Hypotension. Resolved, d/c midodrine -d/c propanolol, decrease baclofen to 10mg  HS>> back up to 20mg  QHS, decrease tizanidine to 2mg  TID 11/2- BP was running 87 systolic last night- and max of 110 in last 24 hours- not dizzy per chart, but if develops any dizziness, suggest primary team restarts Midodrine. Amantadine can cause hypotension, FYI Decrease tizanidine to BID  -04/11/23 bp's still soft-no change with TID tizanidine 11/29 magnesium stopped. BP's have been low regardless of meds. Pt denies any dizziness/light-headedness. Continue to observe. -11/30-12/1 BPs soft but stable, monitor Vitals:   04/12/23 1931 04/13/23 0510 04/13/23 1925 04/14/23 0413  BP: (!) 84/49 98/63 94/71  (!) 90/55   04/14/23 1314 04/14/23 2021 04/15/23 1401 04/15/23 1947  BP: (!) 88/55 103/70 96/63 101/64   04/16/23 0508 04/16/23 1422 04/16/23 2012 04/17/23 0407  BP: 97/64 93/73 92/63  92/60   13. HTN: see above Increase tizanidine to 6mg  TID--now  4mg  TID>>2mg  TID.     - 14. Impaired initiation: continue amantadine 100mg  BID  15. Hyperglycemia: CBGs mildly elevated, d/ced checks. Prosource d/ced as per patient's request  16. Spasticity:  -discussed serial casting with OT and her mom but she defers given pain she had when this was attempted -baclofen and tizanidine decreased due to hypotension, spasticity has been stable. Continue wrist, elbow brace, and PRAFOs. Ordered a night splint for LLE  Repeat CMP today given chronic tizanidine use -9/7 continues to have significant tone despite use of tizanidine and baclofen, may be beneficial consider baclofen pump in outpatient setting if this causes significant difficulties in care  11/29 currently on tizanidine 2mg  tid. Won't increase further because tone has improved (perhaps in part d/t UTI) and  bp won't support more. Will not decrease tizanidine as BP really hasn't changed since we moved it to TID  17.  Dyskinetic movements in head and neck - appears to be myoclonic jerks related to hypoxic brain injury - as discussed with pt mom, no specific meds for this on antispasticity meds , consioder benzo although with pt's SUD would avoid this class - EEG reviewed and was negative for seizure.  -continue baclofen at 20mg  HS  18. Cervical extensor weakness: tizanidine decreased to 2mg  BID  19. Bowel and bladder incontinence: continue bowel and bladder program. Conitnue magneisum supplement D/c miralax, decrease tizanidine to 2mg  BID  20. Fatigue: improved, discussed with team may be secondary to anti-spasticity medications, B12 reviewed and is 335, in suboptimal range, will give 1,000U B12 injection 8/7, will order monthly as needed, metanx started, increase to BID, continue, decrease tizanidine to 3mg  BID  21. MRSA nares positive: negative on repeat, precautions d/ced  22. Tachycardic: resolved, d/c propanolol.  Decrease tizanidine to 2mg  BID. Resolved, amantadine increased back to 100mg  BID, continue this dose,  resolved  23. S/p fall: CT head reviewed and is stable  24. Bilateral foot drop: bilateral AFOs ordered, discussed that these can help her to walk better  25. Vaginal itching: completed fluconazole. Having some discharge now. See below  26. >100k E Coli UTI  -11/28 sensitive to amoxil started 11/26--continue for 1 week      LOS: 150 days A FACE TO FACE EVALUATION WAS PERFORMED  Drema Pry Elliotte Marsalis 04/17/2023, 9:46 AM

## 2023-04-18 LAB — CBC
HCT: 41.6 % (ref 36.0–46.0)
Hemoglobin: 14 g/dL (ref 12.0–15.0)
MCH: 31.8 pg (ref 26.0–34.0)
MCHC: 33.7 g/dL (ref 30.0–36.0)
MCV: 94.5 fL (ref 80.0–100.0)
Platelets: 194 10*3/uL (ref 150–400)
RBC: 4.4 MIL/uL (ref 3.87–5.11)
RDW: 12.8 % (ref 11.5–15.5)
WBC: 6 10*3/uL (ref 4.0–10.5)
nRBC: 0 % (ref 0.0–0.2)

## 2023-04-18 LAB — BASIC METABOLIC PANEL
Anion gap: 9 (ref 5–15)
BUN: 17 mg/dL (ref 6–20)
CO2: 23 mmol/L (ref 22–32)
Calcium: 9 mg/dL (ref 8.9–10.3)
Chloride: 105 mmol/L (ref 98–111)
Creatinine, Ser: 0.67 mg/dL (ref 0.44–1.00)
GFR, Estimated: >60 mL/min (ref >60)
Glucose, Bld: 79 mg/dL (ref 70–99)
Potassium: 3.8 mmol/L (ref 3.5–5.1)
Sodium: 137 mmol/L (ref 135–145)

## 2023-04-18 MED ORDER — TIZANIDINE HCL 4 MG PO TABS
2.0000 mg | ORAL_TABLET | Freq: Every day | ORAL | Status: DC
  Administered 2023-04-19 – 2023-05-02 (×14): 2 mg via ORAL
  Filled 2023-04-18 (×14): qty 1

## 2023-04-18 NOTE — Progress Notes (Signed)
Physical Therapy Session Note  Patient Details  Name: Nicole [name] MRN: [id number] Date of Birth: 04/27/1976  Today's Date: 04/18/2023 PT Individual Time: 1130-1200 + 4098-1191 PT Individual Time Calculation (min): 30 min  + 42 min  Short Term Goals: Week 1:  PT Short Term Goal 1 (Week 1): Pt will improve bed mobility to minA consistently PT Short Term Goal 1 - Progress (Week 1): Not met PT Short Term Goal 2 (Week 1): Pt will complete functional outcome measure to assess balance and falls risk PT Short Term Goal 2 - Progress (Week 1): Not met PT Short Term Goal 3 (Week 1): pt will ambulate 166ft with modA and LRAD PT Short Term Goal 3 - Progress (Week 1): Not met  Skilled Therapeutic Interventions/Progress Updates:      1st session: Pt sitting in wheelchair on arrival - reports she's waiting on pain medication for her L thigh. Pt showing no signs of pain during treatment.   Transported to day room rehab gym at wheelchair level. Sit<>stand to RW at [facility]. Worked on static standing with back against the wall as TC for postural extension, working on getting shoulders and the back of her head against the wall with her knees straight to get extension. Unable to straighten her L knee all the way due to tone and pain but able to get her shoulders and head fully against the wall. MinA needed for balance during this.   Gait training ~26ft with minA and RW from w/c to Kinetron - step-to pattern with decreased L heel strike, short step lengths bilaterally. ModA for safely sitting on Kinetron and she completed x8 minutes at L50cm/sec resistance. Cues for full ROM bilaterally - difficult for her LLE > RLE.   Returned to her room and patient left sitting upright in w/c with safety belt alarm on. Call bell and personal items within reach.    2nd session: Pt sitting in w/c on arrival - agreeable to therapy treatment. She has no pain is requesting a cup of coffee. Transported to main rehab gym for time.  Provided her a cup of coffee (prepared away from patient and added ice to cool for safety). Pt appreciative.   Sit<>stand to RW with CGA. Ambulates at minA level ~63ft + ~15ft with min/modA and RW. Worked more on reciprocal stepping, RW management, and forward gaze as she ambulates.   Worked on unsupported standing with min/modA for standing balance as she tosses horseshoe's - persistent posterior bias in standing with delayed and insufficient righting responses. Then instructed patient in picking up horseshoes from the ground in standing with RW support - mod/maxA needed to prevent falling as she bends to pick up objects from the ground.   Pt requesting to lie down in bed at end of session. minA stand pivot transfer with B HHA to encourage functional independence and stepping for transferring. MinA for returning to supine. Doffed tennis shoe and CAM boot. Alarm on, all needs met.   Therapy Documentation Precautions:  Precautions Precautions: Fall Precaution Comments: delayed processing, Increased tone UB and LB Required Braces or Orthoses: Other Brace Other Brace: B adjustable night splints; B elbow "cosey" splints; L palm protector; R mitt Restrictions Weight Bearing Restrictions: No General:    Therapy/Group: Individual Therapy  Orrin Brigham 04/18/2023, 7:34 AM

## 2023-04-18 NOTE — Progress Notes (Addendum)
PROGRESS NOTE   Subjective/Complaints: Showering Patient's chart reviewed- No issues reported overnight Vitals signs stable except for hypotension  ROS: as per HPI. Denies CP, SOB, abd pain, N/V/D/C, or any other complaints at this time.     Objective:   No results found. Recent Labs    04/18/23 0516  WBC 6.0  HGB 14.0  HCT 41.6  PLT 194        Recent Labs    04/18/23 0516  NA 137  K 3.8  CL 105  CO2 23  GLUCOSE 79  BUN 17  CREATININE 0.67  CALCIUM 9.0         Intake/Output Summary (Last 24 hours) at 04/18/2023 0925 Last data filed at 04/18/2023 0846 Gross per 24 hour  Intake 720 ml  Output --  Net 720 ml           Physical Exam: Vital Signs Blood pressure 96/68, pulse 64, temperature 98.5 F (36.9 C), temperature source Oral, resp. rate 16, height 5\' 1"  (1.549 m), weight 53.6 kg, SpO2 100%.   Constitutional: No distress . Vital signs reviewed. Resting in bed.  HEENT: NCAT, EOMI, oral membranes moist, glasses donned Neck: supple Cardiovascular: RRR without murmur. No JVD    Respiratory/Chest: CTA Bilaterally without wheezes or rales. Normal effort    GI/Abdomen: soft, BS +, non-tender, non-distended Ext: no clubbing, cyanosis, or edema Psych: pleasant and cooperative Neuro: speech clearer today  Skin: + Scaling dried skin on bilateral heels left buttock unstageable - not examined + Left heel stage III DTI has healed  Neurologic: AAOx2,  to year but not month or date. Follows most simple commands.  + Hypophonation, speech intelligability much improved MAS 1 right elbow,MAS  tr to 1 in RIght finger and wrist flexors  MAS 1 in extensors of LE's MAS 1 L elbow , MAS 1+ in L wrist and finger flexors. Hypersensitivity to left hand improved, +left hand is not contracted, Left sided strength improved, decreased range of motion and pain at end range of motion in left shoulder--stable  12/4  Musculoskeletal: limited bilateral shoulder and elbow ROM due to increased tone ,pain with palpation of right shoulder--unchanged    Assessment/Plan: 1. Functional deficits which require 3+ hours per day of interdisciplinary therapy in a comprehensive inpatient rehab setting. Physiatrist is providing close team supervision and 24 hour management of active medical problems listed below. Physiatrist and rehab team continue to assess barriers to discharge/monitor patient progress toward functional and medical goals  Care Tool:  Bathing    Body parts bathed by patient: Face, Abdomen, Chest, Left upper leg, Right upper leg, Left arm, Front perineal area, Right arm, Right lower leg   Body parts bathed by helper: Buttocks, Left lower leg Body parts n/a: Right arm, Left arm, Front perineal area, Buttocks, Right upper leg, Left upper leg, Right lower leg, Left lower leg   Bathing assist Assist Level: Minimal Assistance - Patient > 75%     Upper Body Dressing/Undressing Upper body dressing   What is the patient wearing?: Pull over shirt    Upper body assist Assist Level: Set up assist    Lower Body Dressing/Undressing Lower body dressing  What is the patient wearing?: Incontinence brief, Pants     Lower body assist Assist for lower body dressing: Moderate Assistance - Patient 50 - 74%     Toileting Toileting Toileting Activity did not occur Press photographer and hygiene only): N/A (no void or bm)  Toileting assist Assist for toileting: Maximal Assistance - Patient 25 - 49%     Transfers Chair/bed transfer  Transfers assist  Chair/bed transfer activity did not occur: Safety/medical concerns  Chair/bed transfer assist level: Moderate Assistance - Patient 50 - 74%     Locomotion Ambulation   Ambulation assist   Ambulation activity did not occur: Safety/medical concerns  Assist level: Moderate Assistance - Patient 50 - 74% Assistive device:  Walker-rolling Max distance: 50ft   Walk 10 feet activity   Assist  Walk 10 feet activity did not occur: Safety/medical concerns  Assist level: Moderate Assistance - Patient - 50 - 74% Assistive device: Walker-rolling   Walk 50 feet activity   Assist Walk 50 feet with 2 turns activity did not occur: Safety/medical concerns         Walk 150 feet activity   Assist Walk 150 feet activity did not occur: Safety/medical concerns         Walk 10 feet on uneven surface  activity   Assist Walk 10 feet on uneven surfaces activity did not occur: Safety/medical concerns         Wheelchair     Assist Is the patient using a wheelchair?: Yes Type of Wheelchair: Manual    Wheelchair assist level: Dependent - Patient 0% Max wheelchair distance: 36ft    Wheelchair 50 feet with 2 turns activity    Assist    Wheelchair 50 feet with 2 turns activity did not occur: Safety/medical concerns   Assist Level: Dependent - Patient 0%   Wheelchair 150 feet activity     Assist  Wheelchair 150 feet activity did not occur: Safety/medical concerns   Assist Level: Dependent - Patient 0%   Blood pressure 96/68, pulse 64, temperature 98.5 F (36.9 C), temperature source Oral, resp. rate 16, height 5\' 1"  (1.549 m), weight 53.6 kg, SpO2 100%.  Medical Problem List and Plan: 1. 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 Awaiting approval of disability. SW has been working tirelessly with TOC/others on placement. Disability  remains sticking point Code status  DNR -Continue CIR therapies including PT, OT, and SLP, discussed that  she continues to make progress, transferred with supervision today!!  Team conference 12/4  2.  Impaired mobility: continue Lovenox 30mg  daily, weekly creatinine ordered             -antiplatelet therapy: N/A  3. Pain:  -Knee pain: Tylenol as needed, continue voltaren gel scheduled. XR reviewed and is stable, discussed with therapy -Right wrist swelling: XR ordered and shows no acute fracture, shows 2.56mm ulnar variance, brace removed to given patient a break from it. Voltaren gel ordered prn for discomfort, continue, CT ordered and show no acute osseus injury, continue ace wrap   -Foot pain: tramadol ordered prn, improved today, continue prn  -Headaches:  continue tylenol and tramadol prn -Low back pain: continue kpad, asked [place] to please bring for her -Whole body hypersensitivity: gabapentin 100mg  TID ordered -Pain in left heel: XR obtained and negative, cam boot ordered to help offload heel with therapy, discussed that this has helped -R shoulder pain: XR neg for acute etiology on 04/06/23, voltaren gel ordered--encourage ROM as tolerated  4. History of anxiety: discussed with her mom that she feels this was a big risk factor for her accident, propanolol d/ced due to hypotension. Continue magnesium supplement  5. Neuropsych/cognition: This patient is not capable of making decisions on her own behalf  - 10/12: Still not capable of medical decision making; BUT oriented and communicating, following directions - MUCH improved - PRN melatonin 3mg    for insomnia  6. Skin: -Buttock/sacrum--foam dressing, pressure relief, nutrition -stage 3 left heel, continue foam dressing/ pressure relief  7. Fluids/Electrolytes/Nutrition: Routine in and outs with follow-up chemistries  -Eating  well with cueing   -Dysphagia: continue D3/thins--tolerating this diet -Low protein stores: d/c prosource since patient hates these and is eating 100% of meals -Suboptimal vitamin D level: increase to 5,000 U  daily. continue this dose  -History of magnesium deficiency: magnesium 500mg  stopped to support BP  8.  Cavitary right lower lobe pneumonia likely aspiration pneumonia/MRSA pneumonia.  Resolved    Latest Ref Rng & Units 04/18/2023    5:16 AM 04/04/2023    6:09 AM 02/05/2023   11:12 AM  CBC  WBC 4.0 - 10.5 K/uL 6.0  5.6  8.5   Hemoglobin 12.0 - 15.0 g/dL 16.1  09.6  04.5   Hematocrit 36.0 - 46.0 % 41.6  42.1  46.7   Platelets 150 - 400 K/uL 194  224  284      9.  History of drug abuse.  Positive cocaine on urine drug screen.  Provide counseling  10.  AKI/hypovolemia and ATN.  Resolved Appears to have fair fluid intake, creatinine has been stable  -recheck labs Monday      Latest Ref Rng & Units 04/18/2023    5:16 AM 04/04/2023    6:09 AM 04/02/2023    3:39 PM  BMP  Glucose 70 - 99 mg/dL 79  91  85   BUN 6 - 20 mg/dL 17  20  17    Creatinine 0.44 - 1.00 mg/dL 4.09  8.11  9.14   Sodium 135 - 145 mmol/L 137  138  136   Potassium 3.5 - 5.1 mmol/L 3.8  3.9  4.1   Chloride 98 - 111 mmol/L 105  105  100   CO2 22 - 32 mmol/L 23  24  26    Calcium 8.9 - 10.3 mg/dL 9.0  9.3  8.8     11.  Mild transaminitis with rhabdomyolysis.  Both resolved  12.  Hypotension: -d/c propanolol, decreased tizanidine and baclofen to HS  Vitals:   04/14/23 0413 04/14/23 1314 04/14/23 2021 04/15/23 1401  BP: (!) 90/55 (!) 88/55 103/70 96/63   04/15/23 1947 04/16/23 0508 04/16/23 1422 04/16/23 2012  BP: 101/64 97/64 93/73  92/63   04/17/23 0407 04/17/23 1320 04/17/23 1935 04/18/23 0402  BP: 92/60 (!) 85/73 (!) 84/55 96/68   13. HTN: see above Increase tizanidine to 6mg  TID--now 4mg  TID>>2mg  TID.    - 14. Impaired initiation: continue amantadine 100mg  BID  15. Hyperglycemia: CBGs mildly elevated, d/ced checks. Prosource d/ced as per patient's request  16. Spasticity:  -discussed serial casting with OT and her mom but she  defers given pain she had when this was attempted -baclofen and tizanidine  decreased due to hypotension, spasticity has been stable. Continue wrist, elbow brace, and PRAFOs. Ordered a night splint for LLE  -tizanidine decreased to 2mg  HS  17.  Dyskinetic movements in head and neck - appears to be myoclonic jerks related to hypoxic brain injury - as discussed with pt mom, no specific meds for this on antispasticity meds , consioder benzo although with pt's SUD would avoid this class - EEG reviewed and was negative for seizure.  -continue baclofen at 20mg  HS  18. Cervical extensor weakness: decrease tizanidine to 2mg  HS  19. Bowel and bladder incontinence: continue bowel and bladder program. Conitnue magneisum supplement D/c miralax, decrease tizanidine to 2mg  BID  20. Fatigue: improved, discussed with team may be secondary to anti-spasticity medications, B12 reviewed and is 335, in suboptimal range, will give 1,000U B12 injection 8/7, will order monthly as needed, metanx started, increase to BID, continue, decrease tizanidine to 3mg  BID  21. MRSA nares positive: negative on repeat, precautions d/ced  22. Tachycardic: resolved, d/c propanolol.  Decrease tizanidine to 2mg  BID. Resolved, amantadine increased back to 100mg  BID, continue this dose, resolved  23. S/p fall: CT head reviewed and is stable  24. Bilateral foot drop: bilateral AFOs ordered, discussed that these can help her to walk better  25. Vaginal itching: completed fluconazole. Having some discharge now. See below  26. >100k E Coli UTI  -11/28 sensitive to amoxil, completed 1 week course  27. Poor sitting balance: continue OT/PT      LOS: 151 days A FACE TO FACE EVALUATION WAS PERFORMED  Ambera Fedele P Tritia Endo 04/18/2023, 9:25 AM

## 2023-04-18 NOTE — Patient Care Conference (Signed)
Inpatient RehabilitationTeam Conference and Plan of Care Update Date: 04/18/2023   Time: 11:14 AM    Patient Name: Nicole [name]      Medical Record Number: [id number]  Date of Birth: [birth year] Sex: Female         Room/Bed: 4W09C/4W09C-01 Payor Info: Payor: [employer] / Plan: [employer] / Product Type: *No Product type* /    Admit Date/Time:  11/18/2022  3:15 PM  Primary Diagnosis:  Hypoxic brain injury [facility])  Hospital Problems: Principal Problem:   Hypoxic brain injury (HCC) Active Problems:   Protein-calorie malnutrition, severe   Essential hypertension   Pressure injury of skin    Expected Discharge Date: Expected Discharge Date:  (SNF pending)  Team Members Present: Physician leading conference: Dr. Sula Soda Social Worker Present: Dossie Der, LCSW Nurse Present: Chana Bode, RN PT Present: Wynelle Link, PT OT Present: Bretta Bang, OT SLP Present: [name], SLP PPS Coordinator present : Fae Pippin, SLP     Current Status/Progress Goal Weekly Team Focus  Bowel/Bladder   Continent of bowel, last BM 12/3. Can be incontinent of urine at times due to urgency.   Timed toileting to avoid incontinent episodes.   Assist with toileting as needed.    Swallow/Nutrition/ Hydration               ADL's   Mod assist lower body, set up upper body, min assist trasnfer- shower with grab bar   Goals min overall   balance, functional transfers    Mobility   min/modA bed mobility, minA sit<>stands, minA stand pivot transfers with BHHA, minA gait ~61ft, modA gait up to 148ft. MaxA x4 stairs. Tolerating CAM boot well.   Goals upgraded to MIn A  Postural control, stretching into extension, gait training, functional transfers    Communication   decreased vocal intensity, improves with verbal cues   mod assist at the sentence/conversational level   progress towards improving intelligibility in variety of setting/situations and across     Safety/Cognition/ Behavioral Observations  supervision-min assist for temporal orientation, supervision for attention, mod assist for basic problem solving, mod assist for termination of action   mod A   continue to address oriented to time, mildly complex problem solving, sustained attention, carryover of writing strategies    Pain   Has pain in R foot, back, and at times headaches. PRN tramadol available.   Pain level to be <5.   Assess pain and Q shift and as needed.    Skin   Healed stage 3 on R heel, MASD to buttocks.   Remain free of further skin breakdown.  Assess skin Q shift and as needed.      Discharge Planning:  Search continues for SNF bed-disability pending is the barrier will not get bed offer without this being approved. Working with management on this issue   Team Discussion: Patient continues to be limited by pain; MD adjusting medications. UTI treated and skin managed. Note perseveration on tasks.  Patient on target to meet rehab goals: yes, currently able to stand and step into the shower with supervision. Needs min assist for sit- stand and stand pivot transfers. Able to ambulate up to 25' with min assist and needs mod assist for greater discharge and max assist for steps.  Needs supervision - min assist for orientation and sustained attention, with mod assist for problem solving.  *See Care Plan and progress notes for long and short-term goals.   Revisions to Treatment Plan:  CAM boot Posture exercises and stretching   Teaching Needs: Safety, medications, diet modification, skin care, transfers, toileting, etc.   Current Barriers to Discharge: Inaccessible home environment, Lack of/limited family support, and Insurance for SNF coverage  Possible Resolutions to Barriers: SNF pending disability approval     Medical Summary Current Status: hypotension, spasticity, insomnia, UTI, heel pressure injury  Barriers to Discharge: Medical stability  Barriers  to Discharge Comments: hypotension, spasticity, insomnia, UTI, heel pressure injury Possible Resolutions to [employer]: decreased tizanidine to 2mg  at night, continue baclofen 20mg  at night, completed course of amoxicillin, WOC consulted and heel pressure injury is healing well   Continued Need for Acute Rehabilitation Level of Care: The patient requires daily medical management by a physician with specialized training in physical medicine and rehabilitation for the following reasons: Direction of a multidisciplinary physical rehabilitation program to maximize functional independence : Yes Medical management of patient stability for increased activity during participation in an intensive rehabilitation regime.: Yes Analysis of laboratory values and/or radiology reports with any subsequent need for medication adjustment and/or medical intervention. : Yes   I attest that I was present, lead the team conference, and concur with the assessment and plan of the team.   Chana Bode B 04/18/2023, 2:45 PM

## 2023-04-18 NOTE — Progress Notes (Signed)
Patient ID: Nicole [name], female   DOB: 28-Apr-1976, 47 y.o.   MRN: [id number] Spoke with [facility] who reports their is no assigned person for pt's SSD application. So will try once again to call SSD and see if can speak to anyone regarding the status of her SSD application.

## 2023-04-18 NOTE — Progress Notes (Signed)
Speech Language Pathology Daily Session Note  Patient Details  Name: Nicole [name] MRN: [id number] Date of Birth: 1976/01/09  Today's Date: 04/18/2023 SLP Individual Time: 1002-1057 SLP Individual Time Calculation (min): 55 min  Short Term Goals: Week 9: SLP Short Term Goal 1 (Week 9): Week 21 - Pt will solve basic environmental problems with 90% accuracy given min assist. SLP Short Term Goal 2 (Week 9): Week 21 -Patient will utilize external aids for orientation to place and time with supervision level verbal cues SLP Short Term Goal 3 (Week 9): Week 21 - Pt will demonstrate sustained attention to basic tasks for 30 minutes with supervision A multimodal cues for redirection. SLP Short Term Goal 4 (Week 9): Week 21 - Pt will utilize increased vocal intensity w/ min verbal cues to remain 90% intelligibile at conversation level  Skilled Therapeutic Interventions: SLP conducted skilled therapy session targeting cognitive retraining goals. Patient's speech 90% intelligible at the sentence level with supervision this session. Patient required supervision to orient to month and supervision-min assist to orient to date. Patient oriented to place with supervision. Throughout session, patient sustained attention to basic tasks for 30 minutes with supervision. During basic problem solving tasks, patient required min assist to cease perseveration of task completion movements and during basic money management task required mod assist to add and identify errors in sums of money. Patient reporting pain in left thigh and requesting pain medications, nursing alerted though medications not administered during session. Patient was left in chair with call bell in reach and chair alarm set. SLP will continue to target goals per plan of care.       Pain Pain Assessment Pain Scale: 0-10 Pain Score: 5  Pain Location: Leg (thigh) Pain Orientation: Left Pain Intervention(s): RN made aware  Therapy/Group: Individual  Therapy  Nicole [name], M.A., CCC-SLP  [name] 04/18/2023, 12:42 PM

## 2023-04-18 NOTE — Plan of Care (Signed)
  Problem: Consults Goal: RH STROKE PATIENT EDUCATION Description: See Patient Education module for education specifics  Outcome: Progressing   Problem: RH BOWEL ELIMINATION Goal: RH STG MANAGE BOWEL WITH ASSISTANCE Description: STG Manage Bowel with mod I Assistance. Outcome: Progressing Goal: RH STG MANAGE BOWEL W/MEDICATION W/ASSISTANCE Description: STG Manage Bowel with Medication with mod I Assistance. Outcome: Progressing   Problem: RH BLADDER ELIMINATION Goal: RH STG MANAGE BLADDER WITH ASSISTANCE Description: STG Manage Bladder With toileting Assistance Outcome: Progressing   Problem: RH SKIN INTEGRITY Goal: RH STG MAINTAIN SKIN INTEGRITY WITH ASSISTANCE Description: STG Maintain Skin Integrity With min  Assistance. Outcome: Progressing

## 2023-04-18 NOTE — Progress Notes (Signed)
Occupational Therapy Weekly Progress Note  Patient Details  Name: Nicole [name] MRN: [id number] Date of Birth: 12/08/1975  Beginning of progress report period: April 08, 2023 End of progress report period: April 18, 2023  Today's Date: 04/18/2023 OT Individual Time: 5366-4403 OT Individual Time Calculation (min): 78 min    Patient has met 3 of 5 short term goals.  Patient continues to show improved participation, balance, functional mobility  Patient continues to demonstrate the following deficits: muscle weakness and muscle joint tightness, impaired timing and sequencing, abnormal tone, unbalanced muscle activation, decreased coordination, and decreased motor planning, decreased attention to left, decreased initiation, decreased attention, decreased awareness, decreased problem solving, decreased safety awareness, decreased memory, and delayed processing, and decreased sitting balance, decreased standing balance, decreased postural control, hemiplegia, and decreased balance strategies and therefore will continue to benefit from skilled OT intervention to enhance overall performance with BADL and Reduce care partner burden.  Patient progressing toward long term goals..  Continue plan of care.  OT Short Term Goals Week 19:  OT Short Term Goal 1 (Week 1): Week 19:  Patient will stand at sink with min assist long enough for helper to pull up pants OT Short Term Goal 1 - Progress (Week 1): Met OT Short Term Goal 2 (Week 1): Week 19:  Patient will choose clothing items from drawer without cueing once placed in front of dresser OT Short Term Goal 2 - Progress (Week 1): Met OT Short Term Goal 3 (Week 1): Week 19:  Patient will don right sock on foot with min assist for trunk control OT Short Term Goal 3 - Progress (Week 1): Progressing toward goal OT Short Term Goal 4 (Week 1): Week 19:  Patient will utilize external cues to orient herself to time/ date OT Short Term Goal 4 - Progress (Week 1):  Met  Week 20:  OT Short Term Goal 1 (Week 20): Week 20:  Patient will don sock over right foot with min assist for trunk control OT Short Term Goal 2 (Week 2): Week 20 Patient will demonstrate safe sitting balance while completing familiar BADL task with min cueing OT Short Term Goal 3 (Week 2): Patient will complete toileting with mod assist OT Short Term Goal 4 - Progress (Week 20): Other (comment) OT Short Term Goal 5 - Progress (Week 20): Other (comment)  Skilled Therapeutic Interventions/Progress Updates:    Patient received supine in bed direct handoff from nursing.  Patient indicated need to void and assisted to toilet - completed stand step transfer with contact guard.  Patient needs total assist for wheelchair set up.  Patient able to stand and complete front hygiene, but unable to reach in back.  Patient needs cueing for process to transfer to wheelchair, shower seat, but is consistently requiring less physical assistance.  Patient sits on shower seat in nearly full trunk/hip flexion.  Needing facilitation and cueing to sit upright periodically.  Patient more attentive to steps of bathing and more thorough -at times to the point of being perseverative.  Patient able to shave her legs, needs assist to shave armpits.  Patient dressed while seated in wheelchair with poor sitting balance overall.  Patient gets distracted by task, and falls to right.   Left up in wheelchair at end of session with safety belt in place and engaged and call bell/ personal items in reach.    Therapy Documentation Precautions:  Precautions Precautions: Fall Precaution Comments: delayed processing, Increased tone UB and LB Required Braces or  Orthoses: Other Brace Other Brace: B adjustable night splints; B elbow "cosey" splints; L palm protector; R mitt Restrictions Weight Bearing Restrictions: No   Pain: Pain Assessment Pain Scale: 0-10 Pain Score: 5  Pain Location: Leg (thigh) Pain Orientation: Left Pain  Intervention(s): RN made aware    Therapy/Group: Individual Therapy  [name] 04/18/2023, 12:45 PM

## 2023-04-19 NOTE — Progress Notes (Signed)
Occupational Therapy Session Note  Patient Details  Name: Nicole [name] MRN: [id number] Date of Birth: 11/06/1975  Today's Date: 04/19/2023 OT Individual Time: 7829-5621 OT Individual Time Calculation (min): 60 min    Short Term Goals: Week 20:  OT Short Term Goal 1 (Week 20): Week 20:  Patient will don sock over right foot with min assist for trunk control OT Short Term Goal 1 - Progress (Week 20): Other (comment) OT Short Term Goal 2 (Week ): Patient will demonstrate safe sitting balance while completing familiar BADL task with min cueing OT Short Term Goal 2 - Progress (Week ): Other (comment) OT Short Term Goal 3 (Week 20): Patient will complete toileting with mod assist OT Short Term Goal 3 - Progress (Week 20): Other (comment)    Skilled Therapeutic Interventions/Progress Updates:    Patient received supported sitting in bed finishing breakfast.  Patient declined getting up to wheelchair for breakfast.  Finished breakfast and able to sit herself at edge of bed with contact guard, increased time and cueing for weight shift into right hip.  Patient with left inattention, and does not spontaneously use left hand to push self into sitting without overt cueing.  Washed, dressed, completed grooming at sink.  Patient indicated need to void and transported to bathroom.  Able to complete hygiene in standing with CGA.  Patient agreeable to wearing shoes today.  Worked on wheelchair mobility in room.  Patient needing very direct cueing to stop, listen then act in terms of moving chair backward, forward, turning.  Left up in chair with chair pad alarm in place and engaged and call bell/personal items in reach.  Therapy Documentation Precautions:  Precautions Precautions: Fall Precaution Comments: delayed processing, Increased tone UB and LB Required Braces or Orthoses: Other Brace Other Brace: B adjustable night splints; B elbow "cosey" splints; L palm protector; R mitt Restrictions Weight  Bearing Restrictions: No   Pain:  Denies pain    Therapy/Group: Individual Therapy  [name] 04/19/2023, 8:34 AM

## 2023-04-19 NOTE — Plan of Care (Signed)
  Problem: Consults Goal: RH STROKE PATIENT EDUCATION Description: See Patient Education module for education specifics  Outcome: Progressing   Problem: RH BOWEL ELIMINATION Goal: RH STG MANAGE BOWEL WITH ASSISTANCE Description: STG Manage Bowel with mod I Assistance. Outcome: Progressing Goal: RH STG MANAGE BOWEL W/MEDICATION W/ASSISTANCE Description: STG Manage Bowel with Medication with mod I Assistance. Outcome: Progressing   Problem: RH BLADDER ELIMINATION Goal: RH STG MANAGE BLADDER WITH ASSISTANCE Description: STG Manage Bladder With toileting Assistance Outcome: Progressing   Problem: RH SKIN INTEGRITY Goal: RH STG MAINTAIN SKIN INTEGRITY WITH ASSISTANCE Description: STG Maintain Skin Integrity With min  Assistance. Outcome: Progressing

## 2023-04-19 NOTE — Progress Notes (Signed)
PROGRESS NOTE   Subjective/Complaints: No new complaints this morning Discussed hypotension and decreasing tizanidine to HS and she is appreciative Tolerated OT well today  ROS: as per HPI. Denies CP, SOB, abd pain, N/V/D/C, or any other complaints at this time.     Objective:   No results found. Recent Labs    04/18/23 0516  WBC 6.0  HGB 14.0  HCT 41.6  PLT 194        Recent Labs    04/18/23 0516  NA 137  K 3.8  CL 105  CO2 23  GLUCOSE 79  BUN 17  CREATININE 0.67  CALCIUM 9.0         Intake/Output Summary (Last 24 hours) at 04/19/2023 8413 Last data filed at 04/19/2023 0900 Gross per 24 hour  Intake 960 ml  Output --  Net 960 ml           Physical Exam: Vital Signs Blood pressure 94/64, pulse 69, temperature 98 F (36.7 C), resp. rate 16, height 5\' 1"  (1.549 m), weight 53.6 kg, SpO2 97%.   Constitutional: No distress . Vital signs reviewed. Resting in bed.  HEENT: NCAT, EOMI, oral membranes moist, glasses donned Neck: supple Cardiovascular: RRR without murmur. No JVD    Respiratory/Chest: CTA Bilaterally without wheezes or rales. Normal effort    GI/Abdomen: soft, BS +, non-tender, non-distended Ext: no clubbing, cyanosis, or edema Psych: pleasant and cooperative Neuro: speech clearer today  Skin: + Scaling dried skin on bilateral heels left buttock unstageable - not examined + Left heel stage III DTI has healed  Neurologic: AAOx2,  to year but not month or date. Follows most simple commands.  + Hypophonation, speech intelligability much improved MAS 1 right elbow,MAS  tr to 1 in RIght finger and wrist flexors  MAS 1 in extensors of LE's MAS 1 L elbow , MAS 1+ in L wrist and finger flexors. Hypersensitivity to left hand improved, +left hand is not contracted, Left sided strength improved, decreased range of motion and pain at end range of motion in left shoulder--stable  12/5  Musculoskeletal: limited bilateral shoulder and elbow ROM due to increased tone ,pain with palpation of right shoulder--unchanged    Assessment/Plan: 1. Functional deficits which require 3+ hours per day of interdisciplinary therapy in a comprehensive inpatient rehab setting. Physiatrist is providing close team supervision and 24 hour management of active medical problems listed below. Physiatrist and rehab team continue to assess barriers to discharge/monitor patient progress toward functional and medical goals  Care Tool:  Bathing    Body parts bathed by patient: Face, Abdomen, Chest, Left upper leg, Right upper leg, Left arm, Front perineal area, Right arm, Right lower leg, Left lower leg   Body parts bathed by helper: Buttocks Body parts n/a: Right arm, Left arm, Front perineal area, Buttocks, Right upper leg, Left upper leg, Right lower leg, Left lower leg   Bathing assist Assist Level: Minimal Assistance - Patient > 75%     Upper Body Dressing/Undressing Upper body dressing   What is the patient wearing?: Pull over shirt    Upper body assist Assist Level: Set up assist    Lower Body Dressing/Undressing  Lower body dressing      What is the patient wearing?: Incontinence brief, Pants     Lower body assist Assist for lower body dressing: Moderate Assistance - Patient 50 - 74%     Toileting Toileting Toileting Activity did not occur (Clothing management and hygiene only): N/A (no void or bm)  Toileting assist Assist for toileting: Moderate Assistance - Patient 50 - 74%     Transfers Chair/bed transfer  Transfers assist  Chair/bed transfer activity did not occur: Safety/medical concerns  Chair/bed transfer assist level: Minimal Assistance - Patient > 75%     Locomotion Ambulation   Ambulation assist   Ambulation activity did not occur: Safety/medical concerns  Assist level: Minimal Assistance - Patient > 75% Assistive device: Walker-rolling Max  distance: 75ft   Walk 10 feet activity   Assist  Walk 10 feet activity did not occur: Safety/medical concerns  Assist level: Minimal Assistance - Patient > 75% Assistive device: Walker-rolling   Walk 50 feet activity   Assist Walk 50 feet with 2 turns activity did not occur: Safety/medical concerns  Assist level: Moderate Assistance - Patient - 50 - 74% Assistive device: Walker-rolling    Walk 150 feet activity   Assist Walk 150 feet activity did not occur: Safety/medical concerns         Walk 10 feet on uneven surface  activity   Assist Walk 10 feet on uneven surfaces activity did not occur: Safety/medical concerns         Wheelchair     Assist Is the patient using a wheelchair?: Yes Type of Wheelchair: Manual    Wheelchair assist level: Dependent - Patient 0% Max wheelchair distance: 57ft    Wheelchair 50 feet with 2 turns activity    Assist    Wheelchair 50 feet with 2 turns activity did not occur: Safety/medical concerns   Assist Level: Dependent - Patient 0%   Wheelchair 150 feet activity     Assist  Wheelchair 150 feet activity did not occur: Safety/medical concerns   Assist Level: Dependent - Patient 0%   Blood pressure 94/64, pulse 69, temperature 98 F (36.7 C), resp. rate 16, height 5\' 1"  (1.549 m), weight 53.6 kg, SpO2 97%.  Medical Problem List and Plan: 1. 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 Awaiting approval of disability. SW has been working tirelessly with TOC/others on placement. Disability  remains sticking point Code status  DNR -Continue CIR therapies including PT,  OT, and SLP, discussed that she continues to make progress, transferred with supervision today!!  Team conference 12/4  2.  Impaired mobility: continue Lovenox 30mg  daily, weekly creatinine ordered             -antiplatelet therapy: N/A  3. Pain:  -Knee pain: Tylenol as needed, continue voltaren gel scheduled. XR reviewed and is stable, discussed with therapy -Right wrist swelling: XR ordered and shows no acute fracture, shows 2.56mm ulnar variance, brace removed to given patient a break from it. Voltaren gel ordered prn for discomfort, continue, CT ordered and show no acute osseus injury, continue ace wrap   -Foot  pain: tramadol ordered prn, improved today, continue prn  -Headaches: continue tylenol and tramadol prn -Low back pain: continue kpad, asked [place] to please bring for her -Whole body hypersensitivity: gabapentin 100mg  TID ordered -Pain in left heel: XR obtained and negative, cam boot ordered to help offload heel with therapy, discussed that this has helped -R shoulder pain: XR neg for acute etiology on 04/06/23, voltaren gel ordered--encourage ROM as tolerated  4. History of anxiety: discussed with her mom that she feels this was a big risk factor for her accident, propanolol d/ced due to hypotension. Continue magnesium supplement  5. Neuropsych/cognition: This patient is not capable of making decisions on her own behalf  - 10/12: Still not capable of medical decision making; BUT oriented and communicating, following directions - MUCH improved - PRN melatonin 3mg    for insomnia  6. Skin: -Buttock/sacrum--foam dressing, pressure relief, nutrition -stage 3 left heel, continue foam dressing/ pressure relief  7. Fluids/Electrolytes/Nutrition: Routine in and outs with follow-up chemistries  -Eating  well with cueing   -Dysphagia: continue D3/thins--tolerating this diet -Low protein stores: d/c prosource since patient hates these and is eating 100% of meals -Suboptimal vitamin D  level: increase to 5,000 U daily. continue this dose  -History of magnesium deficiency: magnesium 500mg  stopped to support BP  8.  Cavitary right lower lobe pneumonia likely aspiration pneumonia/MRSA pneumonia.  Resolved    Latest Ref Rng & Units 04/18/2023    5:16 AM 04/04/2023    6:09 AM 02/05/2023   11:12 AM  CBC  WBC 4.0 - 10.5 K/uL 6.0  5.6  8.5   Hemoglobin 12.0 - 15.0 g/dL 42.5  95.6  38.7   Hematocrit 36.0 - 46.0 % 41.6  42.1  46.7   Platelets 150 - 400 K/uL 194  224  284      9.  History of drug abuse.  Positive cocaine on urine drug screen.  Provide counseling  10.  AKI/hypovolemia and ATN.  Resolved Appears to have fair fluid intake, creatinine has been stable  -recheck labs Monday      Latest Ref Rng & Units 04/18/2023    5:16 AM 04/04/2023    6:09 AM 04/02/2023    3:39 PM  BMP  Glucose 70 - 99 mg/dL 79  91  85   BUN 6 - 20 mg/dL 17  20  17    Creatinine 0.44 - 1.00 mg/dL 5.64  3.32  9.51   Sodium 135 - 145 mmol/L 137  138  136   Potassium 3.5 - 5.1 mmol/L 3.8  3.9  4.1   Chloride 98 - 111 mmol/L 105  105  100   CO2 22 - 32 mmol/L 23  24  26    Calcium 8.9 - 10.3 mg/dL 9.0  9.3  8.8     11.  Mild transaminitis with rhabdomyolysis.  Both resolved  12.  Hypotension: -d/c propanolol, decreased tizanidine and baclofen to HS  Vitals:   04/15/23 1401 04/15/23 1947 04/16/23 0508 04/16/23 1422  BP: 96/63 101/64 97/64 93/73    04/16/23 2012 04/17/23 0407 04/17/23 1320 04/17/23 1935  BP: 92/63 92/60 (!) 85/73 (!) 84/55   04/18/23 0402 04/18/23 1326 04/18/23 2012 04/19/23 0309  BP: 96/68 97/70 91/65  94/64   13. HTN: see above Increase tizanidine to 6mg  TID--now 4mg  TID>>2mg  TID.    - 14. Impaired initiation: continue amantadine 100mg  BID  15. Hyperglycemia: CBGs mildly elevated, d/ced checks. Prosource d/ced as per patient's request  16. Spasticity:  -discussed serial  casting with OT and her mom but she defers given pain she had when this was attempted -baclofen  and tizanidine decreased due to hypotension, spasticity has been stable. Continue wrist, elbow brace, and PRAFOs. Ordered a night splint for LLE  -tizanidine decreased to 2mg  HS  17.  Dyskinetic movements in head and neck - appears to be myoclonic jerks related to hypoxic brain injury - as discussed with pt mom, no specific meds for this on antispasticity meds , consioder benzo although with pt's SUD would avoid this class - EEG reviewed and was negative for seizure.  -continue baclofen at 20mg  HS  18. Cervical extensor weakness: decrease tizanidine to 2mg  HS  19. Bowel and bladder incontinence: continue bowel and bladder program. Conitnue magneisum supplement D/c miralax, decrease tizanidine to 2mg  HS  20. Fatigue: improved, discussed with team may be secondary to anti-spasticity medications, B12 reviewed and is 335, in suboptimal range, will give 1,000U B12 injection 8/7, will order monthly as needed, metanx started, increase to BID, continue, decrease tizanidine to 2mg  HS, discussed with patient  21. MRSA nares positive: negative on repeat, precautions d/ced  22. Tachycardic: resolved, d/c propanolol.  Decrease tizanidine to 2mg  BID. Resolved, amantadine increased back to 100mg  BID, continue this dose, resolved  23. S/p fall: CT head reviewed and is stable  24. Bilateral foot drop: bilateral AFOs ordered, discussed that these can help her to walk better  25. Vaginal itching: completed fluconazole. Having some discharge now. See below  26. >100k E Coli UTI  -11/28 sensitive to amoxil, completed 1 week course  27. Poor sitting balance: continue OT/PT      LOS: 152 days A FACE TO FACE EVALUATION WAS PERFORMED  Drema Pry Shemekia Patane 04/19/2023, 9:37 AM

## 2023-04-19 NOTE — Progress Notes (Signed)
Speech Language Pathology Daily Session Note  Patient Details  Name: Nicole [name] MRN: [id number] Date of Birth: December 08, 1975  Today's Date: 04/19/2023 SLP Individual Time: 1120-1200 SLP Individual Time Calculation (min): 40 min  Short Term Goals: Week 9: SLP Short Term Goal 1 (Week 9): Week 21 - Pt will solve basic environmental problems with 90% accuracy given min assist. SLP Short Term Goal 2 (Week 9): Week 21 -Patient will utilize external aids for orientation to place and time with supervision level verbal cues SLP Short Term Goal 3 (Week 9): Week 21 - Pt will demonstrate sustained attention to basic tasks for 30 minutes with supervision A multimodal cues for redirection. SLP Short Term Goal 4 (Week 9): Week 21 - Pt will utilize increased vocal intensity w/ min verbal cues to remain 90% intelligibile at conversation level  Skilled Therapeutic Interventions:  Patient was seen in late am to address cognitive re- training. Pt was alert and seated upright in WC upon SLP arrival. Direct handoff from PT. Pt oriented to month indep and day of week, and year with supervision. Pt also indep oriented to room number, floor, and wing of hospital. SLP challenged pt in navigating hospital to locate a predetermined location and participation in a problem solving and attention activity in the gift shop. Pt warranted overall supervision to min cues for utilizing hospital signs and navigating to gift shop. Once there, pt challenged in locating items over $25 and less than $5. Patient required mod cues to recall instructions and to sustain attention to task. Pt navigated back to her unit and room with sup to min A. Pt left seated upright in WC with chair alarm active and call button within reach. SLP to continue POC  Pain Pain Assessment Pain Scale: 0-10 Pain Score: 5   Therapy/Group: Individual Therapy  [name] 04/19/2023, 12:29 PM

## 2023-04-19 NOTE — Progress Notes (Signed)
Physical Therapy Session Note  Patient Details  Name: Nicole [name] MRN: [id number] Date of Birth: [birth year]  Today's Date: 04/19/2023 PT Individual Time: 1914-7829 PT Individual Time Calculation (min): 69 min   Short Term Goals: Week 1:  PT Short Term Goal 1 (Week 1): Pt will improve bed mobility to minA consistently PT Short Term Goal 1 - Progress (Week 1): Not met PT Short Term Goal 2 (Week 1): Pt will complete functional outcome measure to assess balance and falls risk PT Short Term Goal 2 - Progress (Week 1): Not met PT Short Term Goal 3 (Week 1): pt will ambulate 18ft with modA and LRAD PT Short Term Goal 3 - Progress (Week 1): Not met  Skilled Therapeutic Interventions/Progress Updates:      Pt sitting in wheelchair to start and in agreement to therapy session. She doesn't have the CAM boot on today and tolerated session well without it.   Transported to main rehab gym at wheelchair level.   Gait training ~15ft + 13ft (!!) with CGA/minA and RW - cues for upright posture, forward gaze, adequate stride length, and keeping body within walker frame.   Assisted on Nustep and she completed x8 minutes at L4 resistance - using BUE/BLE to facilitate AAROM for all extremities and to encourage reciprocal movement patterns. Pt needing some repositioning at times due to pelvic obliquity and R trunk leaning.   Pt instructed in standing with CGA/minA for balance and worked on decorating a Christmas Tree to challenge stability and bimanual tasks in standing. Pt needing assistance for manipulating the hooks but able to use both UE 's to adjust tree limbs and tighten hooks around the limbs. She had a few LOB posteriorly that needed correcting but tolerated standing well for > 5 minute increments. Pt enjoyed this activity.   Returned to her room and patient requesting to use the restroom. Ambulatory transfer with minA and RW into the bathroom - maxA needed for managing pants and brief in standing.  Pt unable to void or have BM despite time provided. Returned back to her w/c in similar manner. Direct handoff of care to SLP at end of session with patient sitting in wheelchair.    Therapy Documentation Precautions:  Precautions Precautions: Fall Precaution Comments: delayed processing, Increased tone UB and LB Required Braces or Orthoses: Other Brace Other Brace: B adjustable night splints; B elbow "cosey" splints; L palm protector; R mitt Restrictions Weight Bearing Restrictions: No General:      Therapy/Group: Individual Therapy  Nicole [name] 04/19/2023, 7:45 AM

## 2023-04-19 NOTE — Group Note (Signed)
Patient Details Name: Nicole [name] MRN: [id number] DOB: [birth year] Today's Date: 04/19/2023 Group Description:  Pt will actively participate in 60 minute therapeutic dance group.  MET Dance Group: Pt participated in dance group with an emphasis on social interaction, motor planning, increasing overall activity tolerance and bimanual tasks. All songs were selected by group members. Dance moves included AROM of BUE/BLE gross motor movements with an emphasis on building functional endurance.    Individual level documentation: Patient completed group from sitting level. Patient needed supervision to complete various dance moves with cues for attention.  Patient needed minimal modifications during group.  Pt enjoyed singing along to the music and improvising her own dance moves at times.  Pain: Pain Assessment Pain Scale: 0-10 Pain Score: 4   Nicole [name] 04/19/2023, 4:14 PM

## 2023-04-19 NOTE — Group Note (Addendum)
Patient Details Name: JHANIA TRENCHARD MRN: [id number] DOB: 05/07/1976 Today's Date: 04/19/2023  Time Calculation: OT Group Time Calculation OT Group Start Time: 1430 OT Group Stop Time: 1530 OT Group Time Calculation (min): 60 min      Group Description: Dance Group: Pt participated in dance group with an emphasis on social interaction, motor planning, increasing overall activity tolerance and bimanual tasks. All songs were selected by group members. Dance moves included AROM of BUE/BLE gross motor movements with an emphasis on building functional endurance.    Individual level documentation: Patient completed group from sitting level. Patientt needed supervision to complete various dance moves with cues for therapeutic support and motivation.  Patient needed min modifications during group.  Pain: 0/10  Precautions:  Falls   JENISIS HASKAMP 04/19/2023, 3:49 PM

## 2023-04-20 MED ORDER — ENSURE ENLIVE PO LIQD
237.0000 mL | Freq: Two times a day (BID) | ORAL | Status: DC
  Administered 2023-04-20: 237 mL via ORAL

## 2023-04-20 MED ORDER — BACLOFEN 5 MG HALF TABLET
15.0000 mg | ORAL_TABLET | Freq: Every day | ORAL | Status: DC
  Administered 2023-04-20 – 2023-04-23 (×4): 15 mg via ORAL
  Filled 2023-04-20 (×4): qty 1

## 2023-04-20 NOTE — Plan of Care (Signed)
Goals updated given progress made thus far:  Problem: RH Cognition - SLP Goal: RH LTG Patient will demonstrate orientation with cues Description:  LTG:  Patient will demonstrate orientation to person/place/time/situation with cues (SLP)   Flowsheets Taken 04/20/2023 1621 by [name] A, CCC-SLP LTG: Patient will demonstrate orientation using cueing (SLP): Modified Independent Taken 02/23/2023 0646 by Huston Foley, CCC-SLP LTG Patient will demonstrate orientation to:  Place  Time   Problem: RH Expression Communication Goal: LTG Patient will increase speech intelligibility (SLP) Description: LTG: Patient will increase speech intelligibility at word/phrase/conversation level with cues, % of the time (SLP) Flowsheets (Taken 04/20/2023 1621) LTG: Patient will increase speech intelligibility (SLP): Modified Independent Level: Conversation level   Problem: RH Attention Goal: LTG Patient will demonstrate this level of attention during functional activites (SLP) Description: LTG:  Patient will will demonstrate this level of attention during functional activites (SLP) Flowsheets Taken 04/20/2023 1621 by [name] A, CCC-SLP LTG: Patient will demonstrate this level of attention during cognitive/linguistic activities with assistance of (SLP): Supervision Number of minutes patient will demonstrate attention during cognitive/linguistic activities: 45 minutes Taken 02/23/2023 0646 by Huston Foley, CCC-SLP Patient will demonstrate during cognitive/linguistic activities the attention type of: Sustained Patient will demonstrate this level of attention during cognitive/linguistic activities in: Controlled   Problem: RH Problem Solving Goal: LTG Patient will demonstrate problem solving for (SLP) Description: LTG:  Patient will demonstrate problem solving for basic/complex daily situations with cues  (SLP) Flowsheets Taken 04/20/2023 1621 by [name] A, CCC-SLP LTG Patient will  demonstrate problem solving for: Supervision Taken 11/20/2022 1713 by [name], CCC-SLP LTG: Patient will demonstrate problem solving for (SLP): Basic daily situations   Problem: RH Memory Goal: LTG Patient will demonstrate ability for day to day (SLP) Description: LTG:   Patient will demonstrate ability for day to day recall/carryover during cognitive/linguistic activities with assist  (SLP) Flowsheets (Taken 04/20/2023 1621) LTG: Patient will demonstrate ability for day to day recall: New information LTG: Patient will demonstrate ability for day to day recall/carryover during cognitive/linguistic activities with assist (SLP): Minimal Assistance - Patient > 75%

## 2023-04-20 NOTE — Progress Notes (Signed)
Occupational Therapy Session Note  Patient Details  Name: Nicole [name] MRN: [id number] Date of Birth: May 11, 1976  Today's Date: 04/20/2023 OT Individual Time: 2952-8413 OT Individual Time Calculation (min): 44 min    Short Term Goals: Week 20:  OT Short Term Goal 1 (Week 20): Week 20:  Patient will don sock over right foot with min assist for trunk control OT Short Term Goal 1 - Progress (Week 20): Other (comment) OT Short Term Goal 2 (Week ): Patient will demonstrate safe sitting balance while completing familiar BADL task with min cueing OT Short Term Goal 2 - Progress (Week ): Other (comment) OT Short Term Goal 3 (Week 20): Patient will complete toileting with mod assist  Skilled Therapeutic Interventions/Progress Updates:     Pt received sitting up in wc, dressed and ready for the day upon OT arrival with all BADL needs met. Pt presenting to be in good spirits receptive to skilled OT session reporting 0/10 pain- OT offering intermittent rest breaks, repositioning, and therapeutic support to optimize participation in therapy session. Focus this session dynamic standing balance, functional reaching, and VM coordination.   Pt requesting assistance with putting her hair in ponytail at beginning of session. Attempted to engaged Pt in completing task with pt able to brush her hair in preparation for pony tail with set-up assist.   Transported Pt total A in wc for time management and energy conservation.   Pt completed series of activities on BITS system to work on standing tolerance, functional reaching, dynamic standing balance, and VM coordination.  -Visual Motor-Tracing-Numbers: Pt completed number tracing of large letters on screen to work on VM coordination. Pt completed 15 numbers ranging between accuracy of 68% to 96% with increased practice, attention, and motor control. Pt with most challenge completing letters "O" and "C" -Visual Motor- Pattern replication: Pt completed task in  sitting position with pattern appearing on screen for 10 seconds and Pt then instructed to replicate pattern from memory. Pt able to complete simple patterns with 100% accuracy and moderately difficult patterns with 50-75% accuracy.  -Visual Scanning- Reaction Time- Number Sequencing: Pt completed alphabet sequencing activity standing with RW and sitting intermittently for rest breaks with CGA to min A required for dynamic standing balance during functional reaching. Pt instructed to sequence numbers scattered across the screen from A-Z. Pt required min A to sequence letterers A-P and mod A to sequence letters Q-Z with OT engaging Pt in singing alphabet song to recall sequence.   Transported Pt back to room total A in wc for energy conservation. Pt reporting need to use restroom. Handed Pt off to nursing staff for toileting needs at end of session.   Therapy Documentation Precautions:  Precautions Precautions: Fall Precaution Comments: delayed processing, Increased tone UB and LB Required Braces or Orthoses: Other Brace Other Brace: B adjustable night splints; B elbow "cosey" splints; L palm protector; R mitt Restrictions Weight Bearing Restrictions: No    Therapy/Group: Individual Therapy  Nicole [name] 04/20/2023, 8:02 AM

## 2023-04-20 NOTE — Progress Notes (Signed)
Physical Therapy Session Note  Patient Details  Name: Nicole [name] MRN: [id number] Date of Birth: 01-06-1976  Today's Date: 04/20/2023 PT Individual Time: 5366-4403 PT Individual Time Calculation (min): 40 min   Short Term Goals: Week 8: PT Short Term Goal 1 (Week 8): Pt will initiate bed mobility requiring no more than totalA. PT Short Term Goal 2 (Week 8): Pt will maintain sitting balance unsupported for up to 45 seconds PT Short Term Goal 3 (Week 8): Pt will demonstrate command follow for >10% of tasks. PT Short Term Goal 4 (Week 8): [name] RLE will demonstrate improved joint mobility into knee/ hip extension and ankle DF to neutral. Week 9: PT Short Term Goal 1 (Week 9): Pt will initiate bed mobility with totalA PT Short Term Goal 2 (Week 9): Pt will maintain unsupported sitting balance for 30 seconds without LOB PT Short Term Goal 3 (Week 9): Pt will complete bed<>chair transfers with no more than totalA  Skilled Therapeutic Interventions/Progress Updates:   Received pt sitting in WC, pt agreeable to PT treatment, and reported pain 6/10 in LLE and tailbone - RN notified of request for pain medicine and pt declined wearing CAM boot. Session with emphasis on functional mobility/transfers, generalized strengthening and endurance, and simulated car transfers. Pt transported to/from room in [facility] dependently for time management purposes. Pt performed simulated car transfer via stand<>pivot using RW and mod A overall. Pt required mod verbal/tactile cues for sequencing/technique and RW safety. Pt demo poor motor planning entering car, falling to L and lying on car seat and unable to problem solve how to sit up and scoot hips back in car, requiring max cues and mod A to scoot hips back. Pt transferred on/off Nustep via stand<>pivot with RW and min A. Pt performed seated BLE and RUE strengthening on Nustep for 1.5 minutes for a total of 33 steps on workload 3 with emphasis on cardiovascular endurance - pt  limited by increased pain in tailbone. Returned to room (stopped to assist NT hanging ornaments on garland at nurses station) and concluded session with pt sitting in WC, needs within reach, and chair pad alarm on.   Therapy Documentation Precautions:  Precautions Precautions: Fall Precaution Comments: delayed processing, Increased tone UB and LB Required Braces or Orthoses: Other Brace Other Brace: B adjustable night splints; B elbow "cosey" splints; L palm protector; R mitt Restrictions Weight Bearing Restrictions: No   Therapy/Group: Individual Therapy Marlana Salvage Zaunegger Blima Rich PT, DPT 04/20/2023, 6:58 AM

## 2023-04-20 NOTE — Progress Notes (Signed)
PROGRESS NOTE   Subjective/Complaints: No new complaints this morning Pain is 4/10 Enjoyed rec therapy dance session yesterday   ROS: as per HPI. Denies CP, SOB, abd pain, N/V/D/C, or any other complaints at this time.     Objective:   No results found. Recent Labs    04/18/23 0516  WBC 6.0  HGB 14.0  HCT 41.6  PLT 194        Recent Labs    04/18/23 0516  NA 137  K 3.8  CL 105  CO2 23  GLUCOSE 79  BUN 17  CREATININE 0.67  CALCIUM 9.0         Intake/Output Summary (Last 24 hours) at 04/20/2023 1101 Last data filed at 04/20/2023 0742 Gross per 24 hour  Intake 600 ml  Output --  Net 600 ml           Physical Exam: Vital Signs Blood pressure 97/69, pulse 61, temperature 97.9 F (36.6 C), resp. rate 16, height 5\' 1"  (1.549 m), weight 53.6 kg, SpO2 100%.   Constitutional: No distress . Vital signs reviewed. Resting in bed.  HEENT: NCAT, EOMI, oral membranes moist, glasses donned Neck: supple Cardiovascular: RRR without murmur. No JVD    Respiratory/Chest: CTA Bilaterally without wheezes or rales. Normal effort    GI/Abdomen: soft, BS +, non-tender, non-distended Ext: no clubbing, cyanosis, or edema Psych: pleasant and cooperative Neuro: speech clearer today  Skin: + Scaling dried skin on bilateral heels left buttock unstageable - not examined + Left heel stage III DTI has healed  Neurologic: AAOx2,  to year but not month or date. Follows most simple commands.  + Hypophonation, speech intelligability much improved MAS 1 right elbow,MAS  tr to 1 in RIght finger and wrist flexors  MAS 1 in extensors of LE's MAS 1 L elbow , MAS 1+ in L wrist and finger flexors. Hypersensitivity to left hand improved, +left hand is not contracted, Left sided strength improved, decreased range of motion and pain at end range of motion in left shoulder--stable 12/6  Musculoskeletal: limited bilateral  shoulder and elbow ROM due to increased tone ,pain with palpation of right shoulder--unchanged    Assessment/Plan: 1. Functional deficits which require 3+ hours per day of interdisciplinary therapy in a comprehensive inpatient rehab setting. Physiatrist is providing close team supervision and 24 hour management of active medical problems listed below. Physiatrist and rehab team continue to assess barriers to discharge/monitor patient progress toward functional and medical goals  Care Tool:  Bathing    Body parts bathed by patient: Face, Abdomen, Chest, Left upper leg, Right upper leg, Left arm, Front perineal area, Right arm, Right lower leg, Left lower leg   Body parts bathed by helper: Buttocks Body parts n/a: Right arm, Left arm, Front perineal area, Buttocks, Right upper leg, Left upper leg, Right lower leg, Left lower leg   Bathing assist Assist Level: Minimal Assistance - Patient > 75%     Upper Body Dressing/Undressing Upper body dressing   What is the patient wearing?: Pull over shirt    Upper body assist Assist Level: Set up assist    Lower Body Dressing/Undressing Lower body dressing  What is the patient wearing?: Incontinence brief, Pants     Lower body assist Assist for lower body dressing: Moderate Assistance - Patient 50 - 74%     Toileting Toileting Toileting Activity did not occur (Clothing management and hygiene only): N/A (no void or bm)  Toileting assist Assist for toileting: Moderate Assistance - Patient 50 - 74%     Transfers Chair/bed transfer  Transfers assist  Chair/bed transfer activity did not occur: Safety/medical concerns  Chair/bed transfer assist level: Minimal Assistance - Patient > 75%     Locomotion Ambulation   Ambulation assist   Ambulation activity did not occur: Safety/medical concerns  Assist level: Minimal Assistance - Patient > 75% Assistive device: Walker-rolling Max distance: 67ft   Walk 10 feet  activity   Assist  Walk 10 feet activity did not occur: Safety/medical concerns  Assist level: Minimal Assistance - Patient > 75% Assistive device: Walker-rolling   Walk 50 feet activity   Assist Walk 50 feet with 2 turns activity did not occur: Safety/medical concerns  Assist level: Moderate Assistance - Patient - 50 - 74% Assistive device: Walker-rolling    Walk 150 feet activity   Assist Walk 150 feet activity did not occur: Safety/medical concerns         Walk 10 feet on uneven surface  activity   Assist Walk 10 feet on uneven surfaces activity did not occur: Safety/medical concerns         Wheelchair     Assist Is the patient using a wheelchair?: Yes Type of Wheelchair: Manual    Wheelchair assist level: Dependent - Patient 0% Max wheelchair distance: 45ft    Wheelchair 50 feet with 2 turns activity    Assist    Wheelchair 50 feet with 2 turns activity did not occur: Safety/medical concerns   Assist Level: Dependent - Patient 0%   Wheelchair 150 feet activity     Assist  Wheelchair 150 feet activity did not occur: Safety/medical concerns   Assist Level: Dependent - Patient 0%   Blood pressure 97/69, pulse 61, temperature 97.9 F (36.6 C), resp. rate 16, height 5\' 1"  (1.549 m), weight 53.6 kg, SpO2 100%.  Medical Problem List and Plan: 1. 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 Awaiting approval of disability. SW has been working tirelessly with TOC/others on placement. Disability  remains sticking point Code status  DNR -Continue CIR therapies including PT, OT, and SLP, discussed that  she continues to make progress, transferred with supervision today!!  Team conference 12/4  2.  Impaired mobility: continue Lovenox 30mg  daily, weekly creatinine ordered             -antiplatelet therapy: N/A  3. Pain:  -Knee pain: Tylenol as needed, continue voltaren gel scheduled. XR reviewed and is stable, discussed with therapy -Right wrist swelling: XR ordered and shows no acute fracture, shows 2.95mm ulnar variance, brace removed to given patient a break from it. Voltaren gel ordered prn for discomfort, continue, CT ordered and show no acute osseus injury, continue ace wrap   -Foot pain: tramadol ordered prn, improved today, continue prn  -  Headaches: continue tylenol and tramadol prn -Low back pain: continue kpad, asked [place] to please bring for her -Whole body hypersensitivity: gabapentin 100mg  TID ordered -Pain in left heel: XR obtained and negative, cam boot ordered to help offload heel with therapy, discussed that this has helped -R shoulder pain: XR neg for acute etiology on 04/06/23, voltaren gel ordered--encourage ROM as tolerated  4. History of anxiety: discussed with her mom that she feels this was a big risk factor for her accident, propanolol d/ced due to hypotension. Continue magnesium supplement  5. Neuropsych/cognition: This patient is not capable of making decisions on her own behalf  - 10/12: Still not capable of medical decision making; BUT oriented and communicating, following directions - MUCH improved - PRN melatonin 3mg    for insomnia  6. Skin: -Buttock/sacrum--foam dressing, pressure relief, nutrition -stage 3 left heel, continue foam dressing/ pressure relief  7. Fluids/Electrolytes/Nutrition: Routine in and outs with follow-up chemistries  -Eating  well with cueing   -Dysphagia: continue D3/thins--tolerating this diet -Low protein stores: d/c prosource since patient hates these and is eating 100% of meals -Suboptimal vitamin D level: increase to 5,000 U  daily. continue this dose  -History of magnesium deficiency: magnesium 500mg  stopped to support BP  8.  Cavitary right lower lobe pneumonia likely aspiration pneumonia/MRSA pneumonia.  Resolved    Latest Ref Rng & Units 04/18/2023    5:16 AM 04/04/2023    6:09 AM 02/05/2023   11:12 AM  CBC  WBC 4.0 - 10.5 K/uL 6.0  5.6  8.5   Hemoglobin 12.0 - 15.0 g/dL 11.9  14.7  82.9   Hematocrit 36.0 - 46.0 % 41.6  42.1  46.7   Platelets 150 - 400 K/uL 194  224  284      9.  History of drug abuse.  Positive cocaine on urine drug screen.  Provide counseling  10.  AKI/hypovolemia and ATN.  Resolved Appears to have fair fluid intake, creatinine has been stable  -recheck labs Monday      Latest Ref Rng & Units 04/18/2023    5:16 AM 04/04/2023    6:09 AM 04/02/2023    3:39 PM  BMP  Glucose 70 - 99 mg/dL 79  91  85   BUN 6 - 20 mg/dL 17  20  17    Creatinine 0.44 - 1.00 mg/dL 5.62  1.30  8.65   Sodium 135 - 145 mmol/L 137  138  136   Potassium 3.5 - 5.1 mmol/L 3.8  3.9  4.1   Chloride 98 - 111 mmol/L 105  105  100   CO2 22 - 32 mmol/L 23  24  26    Calcium 8.9 - 10.3 mg/dL 9.0  9.3  8.8     11.  Mild transaminitis with rhabdomyolysis.  Both resolved  12.  Hypotension: -d/c propanolol, decreased tizanidine and baclofen to HS  Vitals:   04/16/23 2012 04/17/23 0407 04/17/23 1320 04/17/23 1935  BP: 92/63 92/60 (!) 85/73 (!) 84/55   04/18/23 0402 04/18/23 1326 04/18/23 2012 04/19/23 0309  BP: 96/68 97/70 91/65  94/64   04/19/23 1308 04/19/23 1554 04/19/23 2042 04/20/23 0523  BP: 115/84 99/64 90/62  97/69   13. HTN: see above Increase tizanidine to 6mg  TID--now 4mg  TID>>2mg  TID.    - 14. Impaired initiation: continue amantadine 100mg  BID  15. Hyperglycemia: CBGs mildly elevated, d/ced checks. Prosource d/ced as per patient's request  16. Spasticity:  -discussed serial casting with OT and her mom but she defers  given pain she had when this was attempted -baclofen and tizanidine decreased due  to hypotension, spasticity has been stable. Continue wrist, elbow brace, and PRAFOs. Ordered a night splint for LLE  -tizanidine decreased to 2mg  HS  -decrease baclofen to 15mg  HS  17.  Dyskinetic movements in head and neck - appears to be myoclonic jerks related to hypoxic brain injury - as discussed with pt mom, no specific meds for this on antispasticity meds , consioder benzo although with pt's SUD would avoid this class - EEG reviewed and was negative for seizure.  Decrease baclofen to 15mg  HS  18. Cervical extensor weakness: decrease tizanidine to 2mg  HS, decrease baclofen to 15mg  HS  19. Bowel and bladder incontinence: continue bowel and bladder program. Conitnue magneisum supplement D/c miralax, decrease tizanidine to 2mg  HS  20. Fatigue: improved, discussed with team may be secondary to anti-spasticity medications, B12 reviewed and is 335, in suboptimal range, will give 1,000U B12 injection 8/7, will order monthly as needed, metanx started, increase to BID, continue, decrease tizanidine to 2mg  HS, discussed with patient, decrease baclofen to 15mg  HS  21. MRSA nares positive: negative on repeat, precautions d/ced  22. Tachycardic: resolved, d/c propanolol.  Decrease tizanidine to 2mg  BID. Resolved, amantadine increased back to 100mg  BID, continue this dose, resolved  23. S/p fall: CT head reviewed and is stable  24. Bilateral foot drop: bilateral AFOs ordered, discussed that these can help her to walk better  25. Vaginal itching: completed fluconazole. Having some discharge now. See below  26. >100k E Coli UTI  -11/28 sensitive to amoxil, completed 1 week course  27. Poor sitting balance: continue OT/PT      LOS: 153 days A FACE TO FACE EVALUATION WAS PERFORMED  Hazaiah Edgecombe P Zaven Klemens 04/20/2023, 11:01 AM

## 2023-04-20 NOTE — Progress Notes (Signed)
Patient ID: Nicole [name], female   DOB: [birth year], 47 y.o.   MRN: [id number]  Have asked again if willing to do a LOG and now considering. Reached out to [employer] to see if could offer if had LOG. Await Tammy's response along with Wandra Mannan.

## 2023-04-20 NOTE — Progress Notes (Signed)
Occupational Therapy Session Note  Patient Details  Name: Nicole [name] MRN: [id number] Date of Birth: 1975/08/11  {CHL IP REHAB OT TIME CALCULATIONS:[id number]}   Short Term Goals: Week 2:  OT Short Term Goal 1 Week 20:  Patient will don sock over right foot with min assist for trunk control OT Short Term Goal 2: Week 20 Patient will demonstrate safe sitting balance while completing familiar BADL task with min cueing OT Short Term Goal 3 (Week 2): Patient will complete toileting with mod assist   Skilled Therapeutic Interventions/Progress Updates:    Patient agreeable to participate in OT session. Reports *** pain level.   Patient participated in skilled OT session focusing on ***. Therapist facilitated/assessed/developed/educated/integrated/elicited *** in order to improve/facilitate/promote    Therapy Documentation Precautions:  Precautions Precautions: Fall Precaution Comments: delayed processing, Increased tone UB and LB Required Braces or Orthoses: Other Brace Other Brace: B adjustable night splints; B elbow "cosey" splints; L palm protector; R mitt Restrictions Weight Bearing Restrictions: No   Therapy/Group: Individual Therapy  Limmie Patricia, OTR/L,CBIS  Supplemental OT - [facility] and [facility] Secure Chat Preferred   04/20/2023, 9:42 PM

## 2023-04-20 NOTE — Progress Notes (Addendum)
Physical Therapy Session Note  Patient Details  Name: Nicole [name] MRN: [id number] Date of Birth: 1976-01-20  Today's Date: 04/20/2023 PT Individual Time: 2841-3244 + 1300-1356 PT Individual Time Calculation (min): 45 min  + 56 min  Short Term Goals: Week 1:  PT Short Term Goal 1 (Week 1): Pt will improve bed mobility to minA consistently PT Short Term Goal 1 - Progress (Week 1): Not met PT Short Term Goal 2 (Week 1): Pt will complete functional outcome measure to assess balance and falls risk PT Short Term Goal 2 - Progress (Week 1): Not met PT Short Term Goal 3 (Week 1): pt will ambulate 142ft with modA and LRAD PT Short Term Goal 3 - Progress (Week 1): Not met  Skilled Therapeutic Interventions/Progress Updates:      1st session: Pt in bed to start with nursing present for morning medications. Pt has no pain, clean brief. Donned pants at bed level - assist for threading only. Supine<>sitting EOB with minA for trunk support. Donned tennis shoes and socks as she sat EOB with totalA for time. Stand pivot transfer into w/c with minA with B HHA. Wheeled sinkside where she completed oral care with setupA while seated. Transported to day room rehab gym and assisted on to mat table. Remainder of session focused on stretching into extension. Used pt selected Christmas Music as a "timer" ranging from 3-4 minutes per song. Patient instructed in lying flat in supine position, then prone on belly, then prone on elbows, and then prone with passive knee flexion to stretch her quads/hip flexors. Pt returned to her room at end of treatment and left sitting upright in wheelchair with chair pad alarm on.    2nd session: Pt sitting in w/c and agreeable to therapy. Pt requesting to go outside, wearing her jacket.   Transported outdoors at wheelchair level. Gait training outdoors at [facility] level for short distances only, ~19ft + ~40ft + ~23ft with seated rest breaks. Pt having some difficulty managing RW on  pavement, especially with turns. Furniture transfers completed on park benches with CGA. In [facility] lobby, practiced walking around the [employer] for gait training to challenge turns in tight spaces, navigating unfamiliar environment, and locating specific items. MinA overall while she used the RW.   Returned upstairs to [employer] floor. Assisted onto Nustep with minA ambulatory transfer. SEtupA for BLE. Completed x6 minutes at L6 resistance with BUE/BLE - encouraging full AROM in available range and reciprocal movement patterns to challenge her motor planning.   Pt instructed in decorating Christmas Tree in unsupported standing with minA for balance - completed bi manual manipulation of Christmas Ornaments and challenged her with hanging from high and low branches.   Pt returned to her room and attempted to call her mother via house phone - she deferred leaving a voicemail because she "doesn't like the sound of her voice." Used memo app on cell phone to record her to improve awareness.   Pt ended session seated in w/c with chair pad alarm on and all needs met.   Therapy Documentation Precautions:  Precautions Precautions: Fall Precaution Comments: delayed processing, Increased tone UB and LB Required Braces or Orthoses: Other Brace Other Brace: B adjustable night splints; B elbow "cosey" splints; L palm protector; R mitt Restrictions Weight Bearing Restrictions: No General:       Therapy/Group: Individual Therapy  Orrin Brigham 04/20/2023, 7:46 AM

## 2023-04-20 NOTE — Progress Notes (Signed)
Speech Language Pathology Weekly Progress and Session Note  Patient Details  Name: Nicole [name] MRN: [id number] Date of Birth: 28-Dec-1975  Beginning of progress report period: April 13, 2023 End of progress report period: April 20, 2023  Today's Date: 04/20/2023 SLP Individual Time: 1500-1530 SLP Individual Time Calculation (min): 30 min  Short Term Goals: Week 9: SLP Short Term Goal 1 (Week 9): Week 21 - Pt will solve basic environmental problems with 90% accuracy given min assist. SLP Short Term Goal 1 - Progress (Week 9): Met SLP Short Term Goal 2 (Week 9): Week 21 -Patient will utilize external aids for orientation to place and time with supervision level verbal cues SLP Short Term Goal 2 - Progress (Week 9): Met SLP Short Term Goal 3 (Week 9): Week 21 - Pt will demonstrate sustained attention to basic tasks for 30 minutes with supervision A multimodal cues for redirection. SLP Short Term Goal 3 - Progress (Week 9): Met SLP Short Term Goal 4 (Week 9): Week 21 - Pt will utilize increased vocal intensity w/ min verbal cues to remain 90% intelligibile at conversation level SLP Short Term Goal 4 - Progress (Week 9): Met  New Short Term Goals: Week 9: SLP Short Term Goal 1 (Week 9): Week 22 - Pt will solve basic environmental problems with 90% accuracy given supervision assist. SLP Short Term Goal 2 (Week 9): Week 22 -Patient will utilize external aids for orientation to place and time with modified independence. SLP Short Term Goal 3 (Week 9): Week 22 - Pt will demonstrate sustained attention to basic tasks for 45 minutes with supervision A multimodal cues for redirection. SLP Short Term Goal 4 (Week 9): Week 22 - Pt will utilize increased vocal intensity w/ supervision verbal cues to remain 90% intelligibile at conversation level  Weekly Progress Updates: Patient continues to make excellent progress towards therapy goals meeting 4/4 short term goals set this reporting period. At most  recent session, patient was oriented to time with supervision (!!). Patient requires min assist to solve basic environmental problems and awareness of mistakes continues to improve. Patient's speech is 100% intelligible when given min assist at all utterance lengths. Patient sustains attention to task for 30 minutes with supervision. SLP will update long term goals given patient's overall improvement. Patient and family education ongoing. Patient will continue to benefit from skilled therapy services during remainder of CIR stay.     Intensity: Minumum of 1-2 x/day, 30 to 90 minutes Frequency: 3 to 5 out of 7 days Duration/Length of Stay: TBD due to SNF placement Treatment/Interventions: Cognitive remediation/compensation;Internal/external aids;Speech/Language facilitation;Cueing hierarchy;Environmental controls;Therapeutic Activities;Functional tasks;Multimodal communication approach;Patient/family education;Therapeutic Exercise   Daily Session  Skilled Therapeutic Interventions: SLP conducted skilled therapy session targeting cognitive retraining goals. Patient greeted upright in bed, agreeable to speech therapy tasks. Patient found with notebook and pen, attempting to work on handwriting. Provided cognitive therapy centering around handwriting. Patient improved from previous targeted session, requiring supervision-min assist to slow rate of handwriting and improve legibility, largely supervision by end of session. Patient demonstrates improved awareness of illegible handwriting though continues to require cues for task termination/perseveration. Patient was left in lowered bed with call bell in reach and bed alarm set. SLP will continue to target goals per plan of care.       Pain Pain Assessment Pain Scale: 0-10 Pain Score: 0-No pain  Therapy/Group: Individual Therapy  [name], M.A., CCC-SLP  [name] 04/20/2023, 4:14 PM

## 2023-04-20 NOTE — Progress Notes (Signed)
Nutrition Follow-up  DOCUMENTATION CODES:   Not applicable  INTERVENTION:   -Continue dysphagia III diet with double protein portions.   -Please encourage intakes.   -Resume Ensure Enlive po BID, each supplement provides 350 kcal and 20 grams of protein.   NUTRITION DIAGNOSIS:   Increased nutrient needs related to acute illness, other (see comment), wound healing (prolonged hospitalization, previously severe malnutrition) as evidenced by estimated needs.  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 PMH includes: anxiety, asthma, depression, drug abuse.    Intakes recorded average 70% x 8 meals.  Patient is eating L during visit, appears to have a good appetite. States she is taking the Ensure and loves it, however does not care for the Juven and declining the oral supplement-both have been discontinued. Reviewed nutrition recommendations to support wound healing.  Weight is trending up since 03/21/23 which is desired. Edema non-pitting RUE, LUE, RLE, LLE recorded yesterday, 12/5.  Medications reviewed and include vitamin C 500 mg daily, vitamin D 25 mcg daily, 1-methylfolate-B6-B12, 3-35-2 mg 2x daily, magnesium gluconate 500 mg at bedtime, MVI/Minerals-1 Tab daily.  Labs reviewed     Diet Order:   Diet Order             DIET DYS 3 Room service appropriate? Yes with Assist; Fluid consistency: Thin  Diet effective now                   EDUCATION NEEDS:   Not appropriate for education at this time  Skin:  Skin Assessment: Reviewed RN Assessment Skin Integrity Issues:: Stage III DTI: left heel, dressing  intact Stage III: Left heel Unstageable: Left buttocks Other: abrasion to buttocks, blister to right and left heel, ecchymosis to left and right abdomen, erythema to bilateral buttock  Last BM:  04/17/23, type 1-large  Height:   Ht Readings from Last 1 Encounters:  11/18/22 5\' 1"  (1.549 m)    Weight:   Wt Readings from Last 1 Encounters:  04/18/23 53.6 kg    Ideal Body Weight:  47.7 kg  BMI:  Body mass index is 22.33 kg/m.  Estimated Nutritional Needs:   Kcal:  1600-1800  Protein:  90-110 gm  Fluid:  >/= 1.6 L    [name], RDLD Clinical Dietitian If unable to reach, please contact "[facility] Inpatient" secure chat group between 8 am-4 pm daily"

## 2023-04-21 NOTE — Progress Notes (Signed)
PROGRESS NOTE   Subjective/Complaints: No new complaints this morning Tone is much improved in left arm despite decreased spasticity medications, discussed with patient   ROS: as per HPI. Denies CP, SOB, abd pain, N/V/D/C, or any other complaints at this time. Decreased spasticity in left arm    Objective:   No results found. No results for input(s): "WBC", "HGB", "HCT", "PLT" in the last 72 hours.       No results for input(s): "NA", "K", "CL", "CO2", "GLUCOSE", "BUN", "CREATININE", "CALCIUM" in the last 72 hours.        Intake/Output Summary (Last 24 hours) at 04/21/2023 1405 Last data filed at 04/21/2023 1610 Gross per 24 hour  Intake 477 ml  Output --  Net 477 ml           Physical Exam: Vital Signs Blood pressure 91/69, pulse 84, temperature 98.6 F (37 C), temperature source Oral, resp. rate 16, height 5\' 1"  (1.549 m), weight 53.6 kg, SpO2 99%.   Constitutional: No distress . Vital signs reviewed. Resting in bed.  HEENT: NCAT, EOMI, oral membranes moist, glasses donned Neck: supple Cardiovascular: RRR without murmur. No JVD    Respiratory/Chest: CTA Bilaterally without wheezes or rales. Normal effort    GI/Abdomen: soft, BS +, non-tender, non-distended Ext: no clubbing, cyanosis, or edema Psych: pleasant and cooperative Neuro: speech clearer today  Skin: + Scaling dried skin on bilateral heels left buttock unstageable - not examined + Left heel stage III DTI has healed  Neurologic: AAOx2,  to year but not month or date. Follows most simple commands.  + Hypophonation, speech intelligability much improved MAS 1 right elbow,MAS  tr to 1 in RIght finger and wrist flexors  MAS 1 in extensors of LE's MAS 1 L elbow , MAS 1+ in L wrist and finger flexors. Hypersensitivity to left hand improved, Left sided strength improved, decreased range of motion and pain at end range of motion in left  shoulder--stable 12/7  Musculoskeletal: limited bilateral shoulder and elbow ROM due to increased tone ,pain with palpation of right shoulder--unchanged    Assessment/Plan: 1. Functional deficits which require 3+ hours per day of interdisciplinary therapy in a comprehensive inpatient rehab setting. Physiatrist is providing close team supervision and 24 hour management of active medical problems listed below. Physiatrist and rehab team continue to assess barriers to discharge/monitor patient progress toward functional and medical goals  Care Tool:  Bathing    Body parts bathed by patient: Face, Abdomen, Chest, Left upper leg, Right upper leg, Left arm, Front perineal area, Right arm, Right lower leg, Left lower leg   Body parts bathed by helper: Buttocks Body parts n/a: Right arm, Left arm, Front perineal area, Buttocks, Right upper leg, Left upper leg, Right lower leg, Left lower leg   Bathing assist Assist Level: Minimal Assistance - Patient > 75%     Upper Body Dressing/Undressing Upper body dressing   What is the patient wearing?: Pull over shirt    Upper body assist Assist Level: Set up assist    Lower Body Dressing/Undressing Lower body dressing      What is the patient wearing?: Incontinence brief, Pants  Lower body assist Assist for lower body dressing: Moderate Assistance - Patient 50 - 74%     Toileting Toileting Toileting Activity did not occur (Clothing management and hygiene only): N/A (no void or bm)  Toileting assist Assist for toileting: Moderate Assistance - Patient 50 - 74%     Transfers Chair/bed transfer  Transfers assist  Chair/bed transfer activity did not occur: Safety/medical concerns  Chair/bed transfer assist level: Minimal Assistance - Patient > 75%     Locomotion Ambulation   Ambulation assist   Ambulation activity did not occur: Safety/medical concerns  Assist level: Minimal Assistance - Patient > 75% Assistive device:  Walker-rolling Max distance: 22ft   Walk 10 feet activity   Assist  Walk 10 feet activity did not occur: Safety/medical concerns  Assist level: Minimal Assistance - Patient > 75% Assistive device: Walker-rolling   Walk 50 feet activity   Assist Walk 50 feet with 2 turns activity did not occur: Safety/medical concerns  Assist level: Moderate Assistance - Patient - 50 - 74% Assistive device: Walker-rolling    Walk 150 feet activity   Assist Walk 150 feet activity did not occur: Safety/medical concerns         Walk 10 feet on uneven surface  activity   Assist Walk 10 feet on uneven surfaces activity did not occur: Safety/medical concerns         Wheelchair     Assist Is the patient using a wheelchair?: Yes Type of Wheelchair: Manual    Wheelchair assist level: Dependent - Patient 0% Max wheelchair distance: 95ft    Wheelchair 50 feet with 2 turns activity    Assist    Wheelchair 50 feet with 2 turns activity did not occur: Safety/medical concerns   Assist Level: Dependent - Patient 0%   Wheelchair 150 feet activity     Assist  Wheelchair 150 feet activity did not occur: Safety/medical concerns   Assist Level: Dependent - Patient 0%   Blood pressure 91/69, pulse 84, temperature 98.6 F (37 C), temperature source Oral, resp. rate 16, height 5\' 1"  (1.549 m), weight 53.6 kg, SpO2 99%.  Medical Problem List and Plan: 1. 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 Awaiting approval of disability. SW has been working tirelessly with TOC/others on placement. Disability  remains sticking point Code  status  DNR -Continue CIR therapies including PT, OT, and SLP, discussed that she continues to make progress, transferred with supervision today!!  Team conference 12/4  2.  Impaired mobility: continue Lovenox 30mg  daily, weekly creatinine ordered             -antiplatelet therapy: N/A  3. Pain:  -Knee pain: Tylenol as needed, continue voltaren gel scheduled. XR reviewed and is stable, discussed with therapy -Right wrist swelling: XR ordered and shows no acute fracture, shows 2.29mm ulnar variance, brace removed to given patient a break from it. Voltaren gel ordered prn for discomfort, continue, CT ordered and show no acute osseus injury, continue ace wrap   -Foot pain: tramadol ordered prn, improved today, continue prn  -Headaches: continue tylenol and tramadol prn -Low back  pain: continue kpad, asked [place] to please bring for her -Whole body hypersensitivity: gabapentin 100mg  TID ordered -Pain in left heel: XR obtained and negative, cam boot ordered to help offload heel with therapy, discussed that this has helped -R shoulder pain: XR neg for acute etiology on 04/06/23, voltaren gel ordered--encourage ROM as tolerated  4. History of anxiety: discussed with her mom that she feels this was a big risk factor for her accident, propanolol d/ced due to hypotension. Continue magnesium supplement  5. Neuropsych/cognition: This patient is not capable of making decisions on her own behalf  - 10/12: Still not capable of medical decision making; BUT oriented and communicating, following directions - MUCH improved - PRN melatonin 3mg    for insomnia  6. Skin: -Buttock/sacrum--foam dressing, pressure relief, nutrition -stage 3 left heel, continue foam dressing/ pressure relief  7. Fluids/Electrolytes/Nutrition: Routine in and outs with follow-up chemistries  -Eating  well with cueing   -Dysphagia: continue D3/thins--tolerating this diet -Low protein stores: d/c prosource since patient hates these  and is eating 100% of meals -Suboptimal vitamin D level: increase to 5,000 U daily. continue this dose  -History of magnesium deficiency: magnesium 500mg  stopped to support BP  8.  Cavitary right lower lobe pneumonia likely aspiration pneumonia/MRSA pneumonia.  Resolved    Latest Ref Rng & Units 04/18/2023    5:16 AM 04/04/2023    6:09 AM 02/05/2023   11:12 AM  CBC  WBC 4.0 - 10.5 K/uL 6.0  5.6  8.5   Hemoglobin 12.0 - 15.0 g/dL 16.1  09.6  04.5   Hematocrit 36.0 - 46.0 % 41.6  42.1  46.7   Platelets 150 - 400 K/uL 194  224  284      9.  History of drug abuse.  Positive cocaine on urine drug screen.  Provide counseling  10.  AKI/hypovolemia and ATN.  Resolved     Latest Ref Rng & Units 04/18/2023    5:16 AM 04/04/2023    6:09 AM 04/02/2023    3:39 PM  BMP  Glucose 70 - 99 mg/dL 79  91  85   BUN 6 - 20 mg/dL 17  20  17    Creatinine 0.44 - 1.00 mg/dL 4.09  8.11  9.14   Sodium 135 - 145 mmol/L 137  138  136   Potassium 3.5 - 5.1 mmol/L 3.8  3.9  4.1   Chloride 98 - 111 mmol/L 105  105  100   CO2 22 - 32 mmol/L 23  24  26    Calcium 8.9 - 10.3 mg/dL 9.0  9.3  8.8     11.  Mild transaminitis with rhabdomyolysis.  Both resolved  12.  Hypotension: resolved  13. HTN: resolved   14. Impaired initiation: continue amantadine 100mg  BID  15. Hyperglycemia: CBGs mildly elevated, d/ced checks. Prosource d/ced as per patient's request  16. Spasticity:  -discussed serial casting with OT and her mom but she defers given pain she had when this was attempted -baclofen and tizanidine decreased due to hypotension, spasticity has been stable. Continue wrist, elbow brace, and PRAFOs. Ordered a night splint for LLE  -tizanidine decreased to 2mg  HS  -decrease baclofen to 15mg  HS  -discussed that left arm spasticity is much improved  17.  Dyskinetic movements in head and neck - appears to be myoclonic jerks related to hypoxic brain injury - as discussed with pt mom, no specific meds for this  on antispasticity meds , consioder benzo although with pt's SUD  would avoid this class - EEG reviewed and was negative for seizure.  Decrease baclofen to 15mg  HS, resolved  18. Cervical extensor weakness: decrease tizanidine to 2mg  HS, decrease baclofen to 15mg  HS, improved  19. Bowel and bladder incontinence: continue bowel and bladder program. D/c miralax, decrease tizanidine to 2mg  HS  20. Fatigue: resolved, discussed with team may be secondary to anti-spasticity medications, B12 reviewed and is 335, in suboptimal range, will give 1,000U B12 injection 8/7, will order monthly as needed, metanx started, increase to BID, continue, decrease tizanidine to 2mg  HS, discussed with patient, decrease baclofen to 15mg  HS  21. MRSA nares positive: negative on repeat, precautions d/ced  22. Tachycardic: resolved, d/c propanolol.  Decrease tizanidine to 2mg  HS. Resolved, amantadine increased back to 100mg  BID, continue this dose, resolved  23. S/p fall: CT head reviewed and is stable  24. Bilateral foot drop: bilateral AFOs ordered, discussed that these can help her to walk better  25. Vaginal itching: completed fluconazole. Having some discharge now. See below  26. >100k E Coli UTI  -11/28 sensitive to amoxil, completed 1 week course  27. Poor sitting balance: continue OT/PT      LOS: 154 days A FACE TO FACE EVALUATION WAS PERFORMED  Tanesha Arambula P Kiyoto Slomski 04/21/2023, 2:05 PM

## 2023-04-21 NOTE — Progress Notes (Signed)
Physical Therapy Session Note  Patient Details  Name: Nicole [name] MRN: [id number] Date of Birth: 1975-09-11  Today's Date: 04/21/2023 PT Individual Time: 4010-2725 PT Individual Time Calculation (min): 64 min   and  Today's Date: 04/21/2023 PT Missed Time: 11 Minutes Missed Time Reason: Other (Comment) (meal)  Short Term Goals: Week 1:  PT Short Term Goal 1 (Week 1): Pt will improve bed mobility to minA consistently PT Short Term Goal 1 - Progress (Week 1): Not met PT Short Term Goal 2 (Week 1): Pt will complete functional outcome measure to assess balance and falls risk PT Short Term Goal 2 - Progress (Week 1): Not met PT Short Term Goal 3 (Week 1): pt will ambulate 162ft with modA and LRAD PT Short Term Goal 3 - Progress (Week 1): Not met  Skilled Therapeutic Interventions/Progress Updates:    Pt received sitting in w/c with her mother present eating pizza. Pt states she didn't realize she had therapy at this time and requests to finish eating her pizza and then is agreeable to participate - pt to notify therapist when she is ready. Missed 11 minutes of skilled physical therapy.  Therapist returned when pt finished with her meal and pt requesting assistance with showering due to not having therapy tomorrow and not feeling clean. Pt already wearing L LE CAM boot.  Sit>stand w/c>RW with CGA/light min A for steadying.   Gait training ~76ft into bathroom using RW with light min A for steadying, especially when navigating up bathroom threshold - achieves reciprocal stepping pattern, adequate management of AD, and adequate gait speed for the environment - min cuing for safety. [name] impressed with how well she is ambulating.  Standing with light min A for balance, able to manage LB clothing given increased time - attempted to void on toilet first, but unable. Stand pivot toilet>shower seat using RW transitioned to B UE support on grab bar with light min A for balance and cuing for  sequencing and safety.  Doffed UB clothing without assist and doffed LB clothing including shoe, CAM boot, and socks total A for time management.   Pt completed bathing tasks primarily from a seated position requiring assist to wash her back and feet. Requires some set-up assistance throughout and does adequate job of managing hand-held shower for basic rinsing tasks, but needs assistance otherwise. Sit<>stands in shower using B UE support on grab bar with CGA - therapist assists with washing peri-area per pt request to ensure cleanliness. Dries off from seated position.   L stand pivot from shower chair to w/c with min A to rotate hips fully to sit.   Donned shirt with min A to get it over her head and threaded on pants, donned socks, shoe and CAM boot total A for time. Sit>stand from w/c>RW with light min A and pulled pants up over hips with only min A for balance but does have some apraxia with trying to tuck her shirt into her pants.  Pt very appreciative of assistance with bathing and working on this functional task and dynamic balance. At end of session, pt left seated in w/c with needs in reach, chair alarm on, and her mother present.   Therapy Documentation Precautions:  Precautions Precautions: Fall Precaution Comments: delayed processing, Increased tone UB and LB Required Braces or Orthoses: Other Brace Other Brace: B adjustable night splints; B elbow "cosey" splints; L palm protector; R mitt Restrictions Weight Bearing Restrictions: No   Pain: Reports the shower helped  alleviate some of her back pain she has that is fairly constant in her muscles. Pt has significant sensitivity to touch on bilateral feet noticed during bathing tasks.    Therapy/Group: Individual Therapy  Ginny Forth , PT, DPT, NCS, CSRS 04/21/2023, 3:39 PM

## 2023-04-22 MED ORDER — B COMPLEX-C PO TABS
1.0000 | ORAL_TABLET | Freq: Every day | ORAL | Status: DC
  Administered 2023-04-22 – 2023-05-29 (×38): 1 via ORAL
  Filled 2023-04-22 (×38): qty 1

## 2023-04-22 NOTE — Progress Notes (Signed)
PROGRESS NOTE   Subjective/Complaints: No new complaints this morning Received request from nursing to discontinue older nursing orders, reviewed and discontinued old nursing and brace orders   ROS: as per HPI. Denied CP, SOB, abd pain, N/V/D/C, or any other complaints at this time. Decreased spasticity in left arm    Objective:   No results found. No results for input(s): "WBC", "HGB", "HCT", "PLT" in the last 72 hours.       No results for input(s): "NA", "K", "CL", "CO2", "GLUCOSE", "BUN", "CREATININE", "CALCIUM" in the last 72 hours.        Intake/Output Summary (Last 24 hours) at 04/22/2023 1510 Last data filed at 04/22/2023 1244 Gross per 24 hour  Intake 360 ml  Output 500 ml  Net -140 ml           Physical Exam: Vital Signs Blood pressure 128/75, pulse 71, temperature 98.1 F (36.7 C), temperature source Oral, resp. rate 18, height 5\' 1"  (1.549 m), weight 53.6 kg, SpO2 100%.   Constitutional: No distress . Vital signs reviewed. Resting in bed.  HEENT: NCAT, EOMI, oral membranes moist, glasses donned Neck: supple Cardiovascular: RRR without murmur. No JVD    Respiratory/Chest: CTA Bilaterally without wheezes or rales. Normal effort    GI/Abdomen: soft, BS +, non-tender, non-distended Ext: no clubbing, cyanosis, or edema Psych: pleasant and cooperative Neuro: speech clearer today  Skin: + Scaling dried skin on bilateral heels left buttock unstageable - not examined + Left heel stage III DTI has healed  Neurologic: AAOx2,  to year but not month or date. Follows most simple commands.  + Hypophonation, speech intelligability much improved MAS 1 right elbow,MAS  tr to 1 in RIght finger and wrist flexors  MAS 1 in extensors of LE's MAS 1 L elbow , MAS 1+ in L wrist and finger flexors. Hypersensitivity to left hand improved, Left sided strength improved, decreased range of motion and pain at end  range of motion in left shoulder--stable 12/8  Musculoskeletal: limited bilateral shoulder and elbow ROM due to increased tone ,pain with palpation of right shoulder--unchanged    Assessment/Plan: 1. Functional deficits which require 3+ hours per day of interdisciplinary therapy in a comprehensive inpatient rehab setting. Physiatrist is providing close team supervision and 24 hour management of active medical problems listed below. Physiatrist and rehab team continue to assess barriers to discharge/monitor patient progress toward functional and medical goals  Care Tool:  Bathing    Body parts bathed by patient: Face, Abdomen, Chest, Left upper leg, Right upper leg, Left arm, Front perineal area, Right arm, Right lower leg, Left lower leg   Body parts bathed by helper: Buttocks Body parts n/a: Right arm, Left arm, Front perineal area, Buttocks, Right upper leg, Left upper leg, Right lower leg, Left lower leg   Bathing assist Assist Level: Minimal Assistance - Patient > 75%     Upper Body Dressing/Undressing Upper body dressing   What is the patient wearing?: Pull over shirt    Upper body assist Assist Level: Set up assist    Lower Body Dressing/Undressing Lower body dressing      What is the patient wearing?: Incontinence brief, Pants  Lower body assist Assist for lower body dressing: Moderate Assistance - Patient 50 - 74%     Toileting Toileting Toileting Activity did not occur (Clothing management and hygiene only): N/A (no void or bm)  Toileting assist Assist for toileting: Moderate Assistance - Patient 50 - 74%     Transfers Chair/bed transfer  Transfers assist  Chair/bed transfer activity did not occur: Safety/medical concerns  Chair/bed transfer assist level: Minimal Assistance - Patient > 75%     Locomotion Ambulation   Ambulation assist   Ambulation activity did not occur: Safety/medical concerns  Assist level: Minimal Assistance - Patient >  75% Assistive device: Walker-rolling Max distance: 69ft   Walk 10 feet activity   Assist  Walk 10 feet activity did not occur: Safety/medical concerns  Assist level: Minimal Assistance - Patient > 75% Assistive device: Walker-rolling   Walk 50 feet activity   Assist Walk 50 feet with 2 turns activity did not occur: Safety/medical concerns  Assist level: Moderate Assistance - Patient - 50 - 74% Assistive device: Walker-rolling    Walk 150 feet activity   Assist Walk 150 feet activity did not occur: Safety/medical concerns         Walk 10 feet on uneven surface  activity   Assist Walk 10 feet on uneven surfaces activity did not occur: Safety/medical concerns         Wheelchair     Assist Is the patient using a wheelchair?: Yes Type of Wheelchair: Manual    Wheelchair assist level: Dependent - Patient 0% Max wheelchair distance: 66ft    Wheelchair 50 feet with 2 turns activity    Assist    Wheelchair 50 feet with 2 turns activity did not occur: Safety/medical concerns   Assist Level: Dependent - Patient 0%   Wheelchair 150 feet activity     Assist  Wheelchair 150 feet activity did not occur: Safety/medical concerns   Assist Level: Dependent - Patient 0%   Blood pressure 128/75, pulse 71, temperature 98.1 F (36.7 C), temperature source Oral, resp. rate 18, height 5\' 1"  (1.549 m), weight 53.6 kg, SpO2 100%.  Medical Problem List and Plan: 1. 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 Awaiting approval of disability. SW has been working tirelessly with TOC/others on placement. Disability  remains  sticking point Code status  DNR -Continue CIR therapies including PT, OT, and SLP, discussed that she continues to make progress, transferred with supervision today!!  Team conference 12/4  2.  Impaired mobility: continue Lovenox 30mg  daily, weekly creatinine ordered             -antiplatelet therapy: N/A  3. Pain:  -Knee pain: Tylenol as needed, continue voltaren gel scheduled. XR reviewed and is stable, discussed with therapy -Right wrist swelling: XR ordered and shows no acute fracture, shows 2.79mm ulnar variance, brace removed to given patient a break from it. Voltaren gel ordered prn for discomfort, continue, CT ordered and show no acute osseus injury, continue ace wrap   -Foot pain: tramadol ordered prn, improved today, continue prn  -Headaches: continue tylenol and tramadol prn -Low back  pain: continue kpad, asked [place] to please bring for her -Whole body hypersensitivity: gabapentin 100mg  TID ordered -Pain in left heel: XR obtained and negative, cam boot ordered to help offload heel with therapy, discussed that this has helped -R shoulder pain: XR neg for acute etiology on 04/06/23, voltaren gel ordered--encourage ROM as tolerated  4. History of anxiety: discussed with her mom that she feels this was a big risk factor for her accident, propanolol d/ced due to hypotension. Continue magnesium supplement  5. Neuropsych/cognition: This patient is not capable of making decisions on her own behalf  - 10/12: Still not capable of medical decision making; BUT oriented and communicating, following directions - MUCH improved - PRN melatonin 3mg    for insomnia  6. Skin: -Buttock/sacrum--foam dressing, pressure relief, nutrition -stage 3 left heel, continue foam dressing/ pressure relief  7. Fluids/Electrolytes/Nutrition: Routine in and outs with follow-up chemistries  -Eating  well with cueing   -Dysphagia: continue D3/thins--tolerating this diet -Low protein stores: d/c prosource since  patient hates these and is eating 100% of meals -Suboptimal vitamin D level: increase to 5,000 U daily. continue this dose  -History of magnesium deficiency: magnesium 500mg  stopped to support BP  8.  Cavitary right lower lobe pneumonia likely aspiration pneumonia/MRSA pneumonia.  Resolved    Latest Ref Rng & Units 04/18/2023    5:16 AM 04/04/2023    6:09 AM 02/05/2023   11:12 AM  CBC  WBC 4.0 - 10.5 K/uL 6.0  5.6  8.5   Hemoglobin 12.0 - 15.0 g/dL 16.1  09.6  04.5   Hematocrit 36.0 - 46.0 % 41.6  42.1  46.7   Platelets 150 - 400 K/uL 194  224  284      9.  History of drug abuse.  Positive cocaine on urine drug screen.  Provide counseling  10.  AKI/hypovolemia and ATN.  Resolved     Latest Ref Rng & Units 04/18/2023    5:16 AM 04/04/2023    6:09 AM 04/02/2023    3:39 PM  BMP  Glucose 70 - 99 mg/dL 79  91  85   BUN 6 - 20 mg/dL 17  20  17    Creatinine 0.44 - 1.00 mg/dL 4.09  8.11  9.14   Sodium 135 - 145 mmol/L 137  138  136   Potassium 3.5 - 5.1 mmol/L 3.8  3.9  4.1   Chloride 98 - 111 mmol/L 105  105  100   CO2 22 - 32 mmol/L 23  24  26    Calcium 8.9 - 10.3 mg/dL 9.0  9.3  8.8     11.  Mild transaminitis with rhabdomyolysis.  Both resolved  12.  Hypotension: resolved  13. HTN: resolved   14. Impaired initiation: continue amantadine 100mg  BID  15. Hyperglycemia: CBGs mildly elevated, d/ced checks. Prosource d/ced as per patient's request  16. Spasticity:  -discussed serial casting with OT and her mom but she defers given pain she had when this was attempted -baclofen and tizanidine decreased due to hypotension, spasticity has been stable. Continue wrist, elbow brace, and PRAFOs. Ordered a night splint for LLE  -tizanidine decreased to 2mg  HS  -decrease baclofen to 15mg  HS  -discussed that left arm spasticity is much improved  17.  Dyskinetic movements in head and neck - appears to be myoclonic jerks related to hypoxic brain injury - as discussed with pt mom, no  specific meds for this on antispasticity meds , consioder benzo although with pt's SUD  would avoid this class - EEG reviewed and was negative for seizure.  Decrease baclofen to 15mg  HS, resolved, continue this dose  18. Cervical extensor weakness: decrease tizanidine to 2mg  HS, decrease baclofen to 15mg  HS, improved, continue this dose  19. Bowel and bladder incontinence: continue bowel and bladder program. D/c miralax, decrease tizanidine to 2mg  HS, continue this dose  20. Fatigue: resolved, discussed with team may be secondary to anti-spasticity medications, B12 reviewed and is 335, in suboptimal range, will give 1,000U B12 injection 8/7, will order monthly as needed, metanx started, increase to BID, continue, decrease tizanidine to 2mg  HS, discussed with patient, decrease baclofen to 15mg  HS, continue this dose  21. MRSA nares positive: negative on repeat, precautions d/ced  22. Tachycardic: resolved, d/c propanolol.  Decrease tizanidine to 2mg  HS. Resolved, amantadine increased back to 100mg  BID, continue this dose, resolved  23. S/p fall: CT head reviewed and is stable  24. Bilateral foot drop: bilateral AFOs ordered, discussed that these can help her to walk better  25. Vaginal itching: completed fluconazole. Having some discharge now. See below  26. >100k E Coli UTI  -11/28 sensitive to amoxil, completed 1 week course  27. Poor sitting balance: continue OT/PT      LOS: 155 days A FACE TO FACE EVALUATION WAS PERFORMED  Nicole [name] 04/22/2023, 3:10 PM

## 2023-04-22 NOTE — Progress Notes (Signed)
Physical Therapy Session Note  Patient Details  Name: Nicole [name] MRN: [id number] Date of Birth: Feb 23, 1976  Today's Date: 04/22/2023 PT Individual Time: 7829-5621 PT Individual Time Calculation (min): 41 min   Short Term Goals: Week 1:  PT Short Term Goal 1 (Week 1): Pt will improve bed mobility to minA consistently PT Short Term Goal 1 - Progress (Week 1): Not met PT Short Term Goal 2 (Week 1): Pt will complete functional outcome measure to assess balance and falls risk PT Short Term Goal 2 - Progress (Week 1): Not met PT Short Term Goal 3 (Week 1): pt will ambulate 153ft with modA and LRAD PT Short Term Goal 3 - Progress (Week 1): Not met  Skilled Therapeutic Interventions/Progress Updates:      Pt lying in bed on arrival - reports 9/10 posterior headache pain, asking for medication. LPN notified via secure chat.   Supine<>Sitting EOB with minA for initiation and direction. Donned pants with assist for threading and pulling over hips in standing. Donned tennis shoes and socks with totalA for time management.   Stand pivot transfer with minA using B HHA from EOB into w/c.   Assisted to mat table in similar manner. On mat table, worked on stretching into extension with BLE and BUE, as well as providing sub-o release to help with headache pain. She reports improvement in headache from 9/10 to 8/10 after release. Provided gentle PROM in all directions for lateral flexion and rotation.   Gait training with B HHA and modA for balance ~33ft + ~191ft. Worked on upright posture, forward gaze, increasing stride length, and widening BOS. Pt reports L hip pain with stance phase but no heel pain with just her shoes and no CAM boot.   ModA for returning to supine position and for boosting to [facility]. Left with alarm on, all needs met.   Therapy Documentation Precautions:  Precautions Precautions: Fall Precaution Comments: delayed processing, Increased tone UB and LB Required Braces or Orthoses:  Other Brace Other Brace: B adjustable night splints; B elbow "cosey" splints; L palm protector; R mitt Restrictions Weight Bearing Restrictions: No General:     Therapy/Group: Individual Therapy  Orrin Brigham 04/22/2023, 2:13 PM

## 2023-04-22 NOTE — Progress Notes (Signed)
   04/22/23 1743  What Happened  Was fall witnessed? No  Was patient injured? No  Patient found on floor  Found by Staff-comment Joni Reining LPN)  Stated prior activity to/from bed, chair, or stretcher  Provider Notification  Provider Name/Title Sula Soda MD  Date Provider Notified 04/22/23  Time Provider Notified 1745  Method of Notification Call  Notification Reason Fall  Follow Up  Family notified Yes - comment (called no answer message left)  Time family notified 70  Adult Fall Risk Assessment  Risk Factor Category (scoring not indicated) Fall has occurred during this admission (document High fall risk)  Patient Fall Risk Level High fall risk  Adult Fall Risk Interventions  Required Bundle Interventions *See Row Information* High fall risk - low, moderate, and high requirements implemented  Additional Interventions Use of appropriate toileting equipment (bedpan, BSC, etc.)  Fall intervention(s) refused/Patient educated regarding refusal Bed alarm  Screening for Fall Injury Risk (To be completed on HIGH fall risk patients) - Assessing Need for Floor Mats  Risk For Fall Injury- Criteria for Floor Mats Previous fall this admission  Will Implement Floor Mats Yes  Vitals  Temp 98.7 F (37.1 C)  BP 115/81  MAP (mmHg) 90  BP Location Left Arm  BP Method Automatic  Patient Position (if appropriate) Lying  Pulse Rate 70  Pulse Rate Source Monitor  Resp 18  Oxygen Therapy  SpO2 99 %  O2 Device Room Air  Pain Assessment  Pain Scale 0-10  Pain Score 0

## 2023-04-23 NOTE — Plan of Care (Signed)
Goals upgraded to CGA overall due to improved progress and safety awareness   Problem: RH Balance Goal: LTG Patient will maintain dynamic sitting balance (PT) Description: LTG:  Patient will maintain dynamic sitting balance with assistance during mobility activities (PT) Flowsheets (Taken 04/23/2023 0845) LTG: Pt will maintain dynamic sitting balance during mobility activities with:: Supervision/Verbal cueing Goal: LTG Patient will maintain dynamic standing balance (PT) Description: LTG:  Patient will maintain dynamic standing balance with assistance during mobility activities (PT) Flowsheets (Taken 04/23/2023 0845) LTG: Pt will maintain dynamic standing balance during mobility activities with:: Contact Guard/Touching assist   Problem: Sit to Stand Goal: LTG:  Patient will perform sit to stand with assistance level (PT) Description: LTG:  Patient will perform sit to stand with assistance level (PT) Flowsheets (Taken 04/23/2023 0845) LTG: PT will perform sit to stand in preparation for functional mobility with assistance level: Contact Guard/Touching assist   Problem: RH Ambulation Goal: LTG Patient will ambulate in controlled environment (PT) Description: LTG: Patient will ambulate in a controlled environment, # of feet with assistance (PT). Flowsheets (Taken 04/23/2023 0845) LTG: Pt will ambulate in controlled environ  assist needed:: Contact Guard/Touching assist LTG: Ambulation distance in controlled environment: 12ft

## 2023-04-23 NOTE — Progress Notes (Signed)
PROGRESS NOTE   Subjective/Complaints: Pt up in chair. Just finished with PT. Pleased that she's walking! Spasticity is improved.   ROS: Patient denies fever, rash, sore throat, blurred vision, dizziness, nausea, vomiting, diarrhea, cough, shortness of breath or chest pain, joint or back/neck pain, headache, or mood change.     Objective:   No results found. No results for input(s): "WBC", "HGB", "HCT", "PLT" in the last 72 hours.       No results for input(s): "NA", "K", "CL", "CO2", "GLUCOSE", "BUN", "CREATININE", "CALCIUM" in the last 72 hours.        Intake/Output Summary (Last 24 hours) at 04/23/2023 0913 Last data filed at 04/23/2023 0330 Gross per 24 hour  Intake 360 ml  Output 675 ml  Net -315 ml           Physical Exam: Vital Signs Blood pressure 101/69, pulse 66, temperature 97.9 F (36.6 C), resp. rate 18, height 5\' 1"  (1.549 m), weight 53.6 kg, SpO2 100%.   Constitutional: No distress . Vital signs reviewed. HEENT: NCAT, EOMI, oral membranes moist Neck: supple Cardiovascular: RRR without murmur. No JVD    Respiratory/Chest: CTA Bilaterally without wheezes or rales. Normal effort    GI/Abdomen: BS +, non-tender, non-distended Ext: no clubbing, cyanosis, or edema Psych: flat but generally pleasant and cooperative  Skin: + Scaling dried skin on bilateral heels left buttock unstageable - not examined + Left heel stage III DTI has resolved  Neurologic: AAOx2,  to year but not month or date. Follows most simple commands.  + Hypophonation, speech intelligability much improved. Fair insight. MAS 1 right elbow,MAS  tr to 1 in RIght finger and wrist flexors  MAS 1 in extensors of LE's MAS 1 L elbow , MAS 1+ in L wrist and finger flexors. Hypersensitivity to left hand improved, Left sided strength improved, decreased range of motion and pain at end range of motion in left shoulder--stable exam  12/9  Musculoskeletal: limited bilateral shoulder and elbow ROM due to increased tone ,right shoulder pain with PROM    Assessment/Plan: 1. Functional deficits which require 3+ hours per day of interdisciplinary therapy in a comprehensive inpatient rehab setting. Physiatrist is providing close team supervision and 24 hour management of active medical problems listed below. Physiatrist and rehab team continue to assess barriers to discharge/monitor patient progress toward functional and medical goals  Care Tool:  Bathing    Body parts bathed by patient: Face, Abdomen, Chest, Left upper leg, Right upper leg, Left arm, Front perineal area, Right arm, Right lower leg, Left lower leg   Body parts bathed by helper: Buttocks Body parts n/a: Right arm, Left arm, Front perineal area, Buttocks, Right upper leg, Left upper leg, Right lower leg, Left lower leg   Bathing assist Assist Level: Minimal Assistance - Patient > 75%     Upper Body Dressing/Undressing Upper body dressing   What is the patient wearing?: Pull over shirt    Upper body assist Assist Level: Set up assist    Lower Body Dressing/Undressing Lower body dressing      What is the patient wearing?: Incontinence brief, Pants     Lower body assist Assist for  lower body dressing: Moderate Assistance - Patient 50 - 74%     Toileting Toileting Toileting Activity did not occur (Clothing management and hygiene only): N/A (no void or bm)  Toileting assist Assist for toileting: Moderate Assistance - Patient 50 - 74%     Transfers Chair/bed transfer  Transfers assist  Chair/bed transfer activity did not occur: Safety/medical concerns  Chair/bed transfer assist level: Minimal Assistance - Patient > 75%     Locomotion Ambulation   Ambulation assist   Ambulation activity did not occur: Safety/medical concerns  Assist level: Minimal Assistance - Patient > 75% Assistive device: Walker-rolling Max distance: 55ft    Walk 10 feet activity   Assist  Walk 10 feet activity did not occur: Safety/medical concerns  Assist level: Minimal Assistance - Patient > 75% Assistive device: Walker-rolling   Walk 50 feet activity   Assist Walk 50 feet with 2 turns activity did not occur: Safety/medical concerns  Assist level: Moderate Assistance - Patient - 50 - 74% Assistive device: Walker-rolling    Walk 150 feet activity   Assist Walk 150 feet activity did not occur: Safety/medical concerns         Walk 10 feet on uneven surface  activity   Assist Walk 10 feet on uneven surfaces activity did not occur: Safety/medical concerns         Wheelchair     Assist Is the patient using a wheelchair?: Yes Type of Wheelchair: Manual    Wheelchair assist level: Dependent - Patient 0% Max wheelchair distance: 62ft    Wheelchair 50 feet with 2 turns activity    Assist    Wheelchair 50 feet with 2 turns activity did not occur: Safety/medical concerns   Assist Level: Dependent - Patient 0%   Wheelchair 150 feet activity     Assist  Wheelchair 150 feet activity did not occur: Safety/medical concerns   Assist Level: Dependent - Patient 0%   Blood pressure 101/69, pulse 66, temperature 97.9 F (36.6 C), resp. rate 18, height 5\' 1"  (1.549 m), weight 53.6 kg, SpO2 100%.  Medical Problem List and Plan: 1. 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 Awaiting approval of disability. SW has been working tirelessly with TOC/others on placement. Disability  remains sticking point Code status  DNR -Continue CIR therapies including PT, OT, and  SLP, discussed that she continues to make progress, transferred with supervision today!!  Team conference 12/4  2.  Impaired mobility: continue Lovenox 30mg  daily, weekly creatinine ordered             -antiplatelet therapy: N/A  3. Pain:  -Knee pain: Tylenol as needed, continue voltaren gel scheduled. XR reviewed and is stable, discussed with therapy -Right wrist swelling: XR ordered and shows no acute fracture, shows 2.87mm ulnar variance, brace removed to given patient a break from it. Voltaren gel ordered prn for discomfort, continue, CT ordered and show no acute osseus injury, continue ace wrap   -Foot pain: tramadol ordered prn, improved today, continue prn  -Headaches: continue tylenol and tramadol prn -Low back pain: continue kpad, asked [place] to please bring  for her -Whole body hypersensitivity: gabapentin 100mg  TID ordered -Pain in left heel: XR obtained and negative, cam boot ordered to help offload heel with therapy, discussed that this has helped -R shoulder pain: XR neg for acute etiology on 04/06/23, voltaren gel ordered--encourage ROM as tolerated  4. History of anxiety: discussed with her mom that she feels this was a big risk factor for her accident, propanolol d/ced due to hypotension. Continue magnesium supplement  5. Neuropsych/cognition: This patient is not capable of making decisions on her own behalf  - 10/12: Still not capable of medical decision making; BUT oriented and communicating, following directions - MUCH improved - PRN melatonin 3mg    for insomnia  6. Skin: -Buttock/sacrum--foam dressing, pressure relief, nutrition -stage 3 left heel, continue foam dressing/ pressure relief  7. Fluids/Electrolytes/Nutrition: Routine in and outs with follow-up chemistries  -Eating  well with cueing   -Dysphagia: continue D3/thins--tolerating this diet -Low protein stores: d/c prosource since patient hates these and is eating 100% of meals -Suboptimal vitamin D level:  increase to 5,000 U daily. continue this dose  -History of magnesium deficiency: magnesium 500mg  stopped to support BP  -12/9 encourage appropriate PO, no labs today 8.  Cavitary right lower lobe pneumonia likely aspiration pneumonia/MRSA pneumonia.  Resolved    Latest Ref Rng & Units 04/18/2023    5:16 AM 04/04/2023    6:09 AM 02/05/2023   11:12 AM  CBC  WBC 4.0 - 10.5 K/uL 6.0  5.6  8.5   Hemoglobin 12.0 - 15.0 g/dL 16.1  09.6  04.5   Hematocrit 36.0 - 46.0 % 41.6  42.1  46.7   Platelets 150 - 400 K/uL 194  224  284      9.  History of drug abuse.  Positive cocaine on urine drug screen.  Provide counseling  10.  AKI/hypovolemia and ATN.  Resolved     Latest Ref Rng & Units 04/18/2023    5:16 AM 04/04/2023    6:09 AM 04/02/2023    3:39 PM  BMP  Glucose 70 - 99 mg/dL 79  91  85   BUN 6 - 20 mg/dL 17  20  17    Creatinine 0.44 - 1.00 mg/dL 4.09  8.11  9.14   Sodium 135 - 145 mmol/L 137  138  136   Potassium 3.5 - 5.1 mmol/L 3.8  3.9  4.1   Chloride 98 - 111 mmol/L 105  105  100   CO2 22 - 32 mmol/L 23  24  26    Calcium 8.9 - 10.3 mg/dL 9.0  9.3  8.8     11.  Mild transaminitis with rhabdomyolysis.  Both resolved  12.  Hypotension: resolved  13. HTN: resolved   14. Impaired initiation: continue amantadine 100mg  BID  15. Hyperglycemia: CBGs mildly elevated, d/ced checks. Prosource d/ced as per patient's request  16. Spasticity:  -discussed serial casting with OT and her mom but she defers given pain she had when this was attempted -baclofen and tizanidine decreased due to hypotension, spasticity has been stable. Continue wrist, elbow brace, and PRAFOs. Ordered a night splint for LLE  -tizanidine decreased to 2mg  HS  -decrease baclofen to 15mg  HS  -12/9 spasticity as a whole much improved.   17.  Dyskinetic movements in head and neck - appears to be myoclonic jerks related to hypoxic brain injury - as discussed with pt mom, no specific meds for this on antispasticity meds  , consioder benzo although with pt's SUD would  avoid this class - EEG reviewed and was negative for seizure.  -continue baclofen 15mg  HS, resolved  18. Cervical extensor weakness: decrease tizanidine to 2mg  HS, decrease baclofen to 15mg  HS, improved, continue this dose  19. Bowel and bladder incontinence: continue bowel and bladder program. D/c miralax, decrease tizanidine to 2mg  HS, continue this dose  20. Fatigue: resolved, discussed with team may be secondary to anti-spasticity medications, B12 reviewed and is 335, in suboptimal range, will give 1,000U B12 injection 8/7, will order monthly as needed, metanx started, increase to BID, continue, decrease tizanidine to 2mg  HS, discussed with patient, decrease baclofen to 15mg  HS, continue this dose  -12/9 doing well. Continue meds at above dosing 21. MRSA nares positive: negative on repeat, precautions d/ced  22. Tachycardic: resolved, d/c propanolol.  Decrease tizanidine to 2mg  HS. Resolved, amantadine increased back to 100mg  BID, continue this dose, resolved  23. S/p fall: CT head reviewed and is stable  24. Bilateral foot drop: bilateral AFOs ordered, discussed that these can help her to walk better  25. Vaginal itching: completed fluconazole. Having some discharge now. See below  26. >100k E Coli UTI  -11/28 sensitive to amoxil, completed 1 week course  27. Poor sitting balance: continue OT/PT, improving      LOS: 156 days A FACE TO FACE EVALUATION WAS PERFORMED  Ranelle Oyster 04/23/2023, 9:13 AM

## 2023-04-23 NOTE — Progress Notes (Signed)
Physical Therapy Session Note  Patient Details  Name: Nicole [name] MRN: [id number] Date of Birth: 10/06/1975  Today's Date: 04/23/2023 PT Individual Time: 1308-6578  + 1115-1200 PT Individual Time Calculation (min): 45 min  + 45 min  Short Term Goals: Week 1:  PT Short Term Goal 1 (Week 1): Pt will improve bed mobility to minA consistently PT Short Term Goal 1 - Progress (Week 1): Not met PT Short Term Goal 2 (Week 1): Pt will complete functional outcome measure to assess balance and falls risk PT Short Term Goal 2 - Progress (Week 1): Not met PT Short Term Goal 3 (Week 1): pt will ambulate 165ft with modA and LRAD PT Short Term Goal 3 - Progress (Week 1): Not met  Skilled Therapeutic Interventions/Progress Updates:      1st session: Pt lying in bed and agreeable to PT tx. No reports of pain. Per chart review, patient had a fall last night. Asked her about events leading to fall - she reports she saw a paper towel on the ground and wanted to pick it up and fell OOB while reaching for it. Denies hitting her head. Reviewed safety precautions.  Donned paper pants at bed level with minA for threading - pt able to bridge and pull pants up over hips without assistance. She requires minA for supine<>sitting EOB for trunk support and initiation.   Removed dirty night gown without assist as she sat EOB - donned a clean one with minA for pulling OH.   Stand pivot transfer with minA using B HHA to help with facilitation. Pt asking to be wheeled sinkside so she can wash her hair and brush her teeth. She completed this with setupA at seated level.   Transported to day room rehab gym at wheelchair level.   Gait training 2x158ft with CGA (!!!!!!!!!) and RW around nurses station. Cues only for upright posture, forward gaze, and increasing L step length. Patient does well with keeping her body in walker frame - she does tend to rush herself when people/staff cheer her on so cues for safety awareness  needed.  Returned to her room and patient left sitting upright with chair pad alarm on. Pt aware of upcoming therapy schedule.    2nd session: Pt sitting upright in wheelchair - agreeable to PT tx. No c/o pain.   Transported to main rehab gym.  Started with stair training using 6" steps and 2 hand rails. Pt able to navigate up/down x4 + x4 (seated rest break) with minA for both directions. Completed with step-to pattern while forward facing, PT stabilizing hips to help during single leg stance portion of stair navigation.   TUG completed with RW and CGA -41 seconds.  *Scores > 15 seconds indicate increased falls risk.  Reviewed patient's progress since CIR admission with her to help her understanding and awareness of situation - being Dependent care and nonverbal/nonpurposeful on admission to current minA level using RW. Showed her pictures (pictures were taken with her approval) and patient becoming tearful - light emotional support provided.   Pt returned to her room and patient requesting to lie down in bed - minA for stand pivot and minA for lying supine. Alarm on, all needs met.        Therapy Documentation Precautions:  Precautions Precautions: Fall Precaution Comments: delayed processing, Increased tone UB and LB Required Braces or Orthoses: Other Brace Other Brace: B adjustable night splints; B elbow "cosey" splints; L palm protector; R mitt Restrictions Weight Bearing  Restrictions: No General:      Therapy/Group: Individual Therapy  Orrin Brigham 04/23/2023, 7:44 AM

## 2023-04-23 NOTE — Progress Notes (Signed)
Patient ID: Nicole [name], female   DOB: 05/28/1975, 47 y.o.   MRN: [id number]  Continue to work with management regarding ability to get LOG and get a bed offer. Will update FL2 due to pt is ambulating now. Will send to [facility] once have

## 2023-04-23 NOTE — Progress Notes (Signed)
Occupational Therapy Session Note  Patient Details  Name: Nicole [name] MRN: [id number] Date of Birth: [birth year]  Today's Date: 04/23/2023 OT Individual Time: 705 116 [id number] 1404-1501 OT Individual Time Calculation (min): 56 min    Short Term Goals: Week 2:  OT Short Term Goal 1 Week 20:  Patient will don sock over right foot with min assist for trunk control OT Short Term Goal 2: Week 20 Patient will demonstrate safe sitting balance while completing familiar BADL task with min cueing OT Short Term Goal 3 Week 20: Patient will complete toileting with mod assist  Skilled Therapeutic Interventions/Progress Updates:    Session 1: Pt greeted seated in w/c , pt requesting to wash her hands at sink and pt agreeable to OT intervention.       Therapeutic activity: pt completed functional ambulation task with an emphasis on dynamic balance and functional reaching with pt instructed to ambulate 14 ft x5 to retrieve dots and then match dots to cones. Pt completed task with CGA, emphasis on RW mgmt.   Pt completed functional ambulation obstacle course with pt ambulating in between cones with Rw with an emphasis on Rw mgmt, pt needed up to MOD A at times to manage Rw in tight spaces, overall MIN A for functional ambulation.   Pt completed alternating cone taps in standing with BLEs with BUE support from RW to challenge dynamic balance, pt needed MOD cues for sequencing and feet placement but completed task with MINA.   Pt completed dynamic balance task with pt able to stand with no UE support to pin washcloths on line with clothespin with light MINA, pt did have episodes of posterior lean needing MODA for balance.    Pt completed functional ambulation back to room with RW and MINA with w/c follow, MIN verbal cues needed for RW mgmt, pt tends to drift to R side of hallway when ambulating.    ADLs:  Grooming: pt completed hand hygiene at sink with supervision, pt attempted to stand up out of her chair to  reach paper towels needing MAX cues for safety as w/c was unlocked. Pt donned eye cream at sink with set- up assist.    Ended session with pt seated in w/c with all needs within reach and chair alarm activated.                    Session 2: Pt greeted supine in bed with pts mother present, pt agreeable to OT intervention.      Transfers/bed mobility/functional mobility: pt completed supine>sit with light MIN A to elevate trunk into sitting. Stand pivot to w/c with Rw and CGA. Pt completed functional ambulation back to room with Rw and MINA. Pt completed sit>supine with CGA.   Therapeutic activity: pt completed various therapeutic activities focused on standing balance, functional reaching, dynamic balance, problem solving and standing tolerance.  - pt instructed to remove sticky notes pinned to basketball net and read written word on note then ambulate to item to place sticky note on items I.e mat table, window, tree, etc. Pt completed task with MIN A for functional ambulation having greatest difficulty navigating RW in tight spaces and when reaching out of BOS with unilateral support. Pt does present with some impulsivity wanting to reach spontaneously even though feet were misplaced.  - pt completed seated problem solving task with pt engaging in puzzle tasks with pt shown picture of puzzles in specific position and pt instructed to place pieces in same pattern. Pt  did best when therapist completed puzzle then asked pt if it was correct, from there, pt was able to identify mistakes and fix them accurately.    ADLs:  Grooming: pt requested therapist to put her hair up in "high pony" provided total A for hair care with pt directing level of care appropriately.  Transfers: pt completed ambulatory transfer from EOB>bathroom with RW and MINA.  Toileting: pt with continent urine void, pt completed 3/3 toileting tasks with MIN A needing assist for clothing mgmt with pt able to complete anterior pericare  in standing.    Ended session with pt supine in bed with all needs within reach and bed alarm activated.                    Therapy Documentation Precautions:  Precautions Precautions: Fall Precaution Comments: delayed processing, Increased tone UB and LB Required Braces or Orthoses: Other Brace Other Brace: B adjustable night splints; B elbow "cosey" splints; L palm protector; R mitt Restrictions Weight Bearing Restrictions: No  Pain: Session 1: no pain reported  Session 2: no pain reported during session    Therapy/Group: Individual Therapy  [name] [facility] 04/23/2023, 12:24 PM

## 2023-04-23 NOTE — [place] FL2 (Signed)
[place] MEDICAID FL2 LEVEL OF CARE FORM     IDENTIFICATION  Patient Name: Nicole [name] Birthdate: 12/26/1975 Sex: female Admission Date (Current Location): 11/18/2022  [place] and [place] Number:  Haynes Bast [id number] S Facility and Address:  [facility] [place]. [facility], [address], [place], [place] [address]      Provider Number: [id number]  Attending Physician Name and Address:  Horton Chin, MD  Relative Name and Phone Number:  Mariea Stable [phone]    Current Level of Care: Other (Comment) (rehab) Recommended Level of Care: Skilled Nursing Facility Prior Approval Number:    Date Approved/Denied:   PASRR Number: [id number]  Discharge Plan: SNF    Current Diagnoses: Patient Active Problem List   Diagnosis Date Noted   Essential hypertension 01/29/2023   Pressure injury of skin 01/29/2023   Protein-calorie malnutrition, severe 01/05/2023   Hypoxic brain injury (HCC) 11/18/2022   Cavitary pneumonia 11/05/2022   Substance use 11/05/2022   Acute respiratory failure (HCC) 10/31/2022   Nonallergic rhinitis 08/23/2020   Mild persistent asthma without complication 06/14/2020   Other allergic rhinitis 06/14/2020   Adverse reaction to food, subsequent encounter 06/14/2020   IBS (irritable bowel syndrome) 05/24/2016   Chronic venous insufficiency 05/24/2016   Migraine 05/24/2016   Pulmonary nodules 10/27/2014   Hepatitis C, chronic (HCC)    Depression 10/09/2013   OSA on CPAP - sees Dr. Jenne Campus in ENT 10/09/2013   Allergic rhinitis 05/30/2012   Cough variant asthma 02/08/2012    Orientation RESPIRATION BLADDER Height & Weight     Self, Place  Normal Incontinent (getting more continent) Weight: 118 lb 2.7 oz (53.6 kg) Height:  5\' 1"  (154.9 cm)  BEHAVIORAL SYMPTOMS/MOOD NEUROLOGICAL BOWEL NUTRITION STATUS      Incontinent (getting more continent) Diet (Dys 3 thin liquids)  AMBULATORY STATUS COMMUNICATION OF NEEDS Skin   Limited  Assist Verbally Other (Comment), Normal (Wounds have heeled L-heel faom on for protection) PU Stage 1 Dressing: Daily PU Stage 2 Dressing: Daily                   Personal Care Assistance Level of Assistance  Bathing, Dressing Bathing Assistance: Limited assistance Feeding assistance: Independent Dressing Assistance: Limited assistance Total Care Assistance: Maximum assistance   Functional Limitations Info  Speech, Sight Sight Info: Adequate Hearing Info: Adequate Speech Info: Impaired    SPECIAL CARE FACTORS FREQUENCY  PT (By licensed PT), OT (By licensed OT), Bowel and bladder program, Speech therapy     PT Frequency: 3-4 x week OT Frequency: 3-4 x week Bowel and Bladder Program Frequency: Timed toleting every 3-4 hours   Speech Therapy Frequency: 3x week      Contractures Contractures Info: Not present    Additional Factors Info  Code Status, Allergies Code Status Info: DNR Allergies Info: [place] nuts, cashews ceclor and nickel     Isolation Precautions Info: Off MRSA precautions been here long period of time     Current Medications (04/23/2023):  This is the current hospital active medication list Current Facility-Administered Medications  Medication Dose Route Frequency Provider Last Rate Last Admin   acetaminophen (TYLENOL) tablet 650 mg  650 mg Oral Q6H PRN Charlton Amor, PA-C   650 mg at 04/21/23 2240   amantadine (SYMMETREL) capsule 100 mg  100 mg Oral BID Horton Chin, MD   100 mg at 04/23/23 5643   B-complex with vitamin C tablet 1 tablet  1 tablet Oral Daily Raulkar, Drema Pry, MD  1 tablet at 04/23/23 0733   baclofen (LIORESAL) tablet 15 mg  15 mg Oral QHS Horton Chin, MD   15 mg at 04/22/23 2108   bisacodyl (DULCOLAX) EC tablet 5 mg  5 mg Oral Daily PRN Street, [place], PA-C       bisacodyl (DULCOLAX) suppository 10 mg  10 mg Rectal Daily PRN Street, [place], PA-C       camphor-menthol [facility]) lotion   Topical PRN Horton Chin, MD   Given at 03/24/23 2207   cholecalciferol (VITAMIN D3) 25 MCG (1000 UNIT) tablet 5,000 Units  5,000 Units Oral Daily Horton Chin, MD   5,000 Units at 04/23/23 3244   diclofenac Sodium (VOLTAREN) 1 % topical gel 2 g  2 g Topical QID Raulkar, Drema Pry, MD   2 g at 04/22/23 0755   enoxaparin (LOVENOX) injection 30 mg  30 mg Subcutaneous Q24H Pham, Minh Q, RPH-CPP   30 mg at 04/22/23 1758   gabapentin (NEURONTIN) capsule 100 mg  100 mg Oral TID Horton Chin, MD   100 mg at 04/23/23 0733   gadobutrol (GADAVIST) 1 MMOL/ML injection 5 mL  5 mL Intravenous Once PRN Raulkar, Drema Pry, MD       Gerhardt's butt cream   Topical BID Jacquelynn Cree, PA-C   Given at 04/22/23 2112   hydrocerin (EUCERIN) cream   Topical BID Horton Chin, MD   Given at 04/23/23 0102   magnesium gluconate (MAGONATE) tablet 500 mg  500 mg Oral QHS Raulkar, Drema Pry, MD   500 mg at 04/22/23 2112   melatonin tablet 3 mg  3 mg Oral QHS PRN Fanny Dance, MD   3 mg at 04/22/23 2112   selenium sulfide (SELSUN) 1 % shampoo   Topical PRN Horton Chin, MD   Given at 01/17/23 2056   tiZANidine (ZANAFLEX) tablet 2 mg  2 mg Oral QHS Raulkar, Drema Pry, MD   2 mg at 04/22/23 2108   traMADol (ULTRAM) tablet 50 mg  50 mg Oral Q6H PRN Horton Chin, MD   50 mg at 04/23/23 7253     Discharge Medications: Please see discharge summary for a list of discharge medications.  Relevant Imaging Results:  Relevant Lab Results:   Additional Information SSN: [id number]  Rayana Geurin, Lemar Livings, LCSW

## 2023-04-24 MED ORDER — TOPIRAMATE 25 MG PO TABS
25.0000 mg | ORAL_TABLET | Freq: Every day | ORAL | Status: DC | PRN
  Administered 2023-04-25 – 2023-05-20 (×4): 25 mg via ORAL
  Filled 2023-04-24 (×4): qty 1

## 2023-04-24 MED ORDER — BACLOFEN 10 MG PO TABS
10.0000 mg | ORAL_TABLET | Freq: Every day | ORAL | Status: DC
  Administered 2023-04-24: 10 mg via ORAL
  Filled 2023-04-24: qty 1

## 2023-04-24 NOTE — Progress Notes (Signed)
Occupational Therapy Session Note  Patient Details  Name: Nicole [name] MRN: [id number] Date of Birth: 1975-09-04  Today's Date: 04/24/2023 OT Individual Time: 2130-8657 OT Individual Time Calculation (min): 72 min    Short Term Goals: Week 2:  OT Short Term Goal 1 Week 20:  Patient will don sock over right foot with min assist for trunk control OT Short Term Goal 2: Week 20 Patient will demonstrate safe sitting balance while completing familiar BADL task with min cueing OT Short Term Goal 3 Week 20: Patient will complete toileting with mod assist  Skilled Therapeutic Interventions/Progress Updates:  Pt greeted supine in bed, pt agreeable to OT intervention.      Transfers/bed mobility: pt completed supine>sit with only CGA, cues needed for initiaiton and sequencing.   Therapeutic activity: pt engaged in old maid game with a focus on problem solving, attention and awareness. Pt needed step by step cues to sequence task and MAX cues to follow rules of game.    ADLs: emphasis on retrieving needed ADL items around the room and problem solving ADL tasks as well as directing level of care. Grooming: pt donned deo and completed hair care at sink with close supervision, pt impulsively stands during grooming tasks needing CGA for balance at times. Did provided assist for hair care per pt request.  UB dressing:donned OH shirt with MIN A as pt continued to don shirt backwards LB dressing: pt donned pants with MIN A needing assist to thread LLE into pants and balance assist needed when pulling pants to waist line in standing Footwear: pt able to don R sock but needed assist with L sock via figure, donned shoes with MODA.   Bathing: pt only wanting to wash her hair in shower despite initially stating that she did want to wash her body.  Transfers: pt completed ambulatory transfer in/out of shower with Rw and MINA  Ended session with pt seated in w/c with all needs within reach, nursing present for  med pass.               Therapy Documentation Precautions:  Precautions Precautions: Fall Precaution Comments: delayed processing, Increased tone UB and LB Required Braces or Orthoses: Other Brace Other Brace: B adjustable night splints; B elbow "cosey" splints; L palm protector; R mitt Restrictions Weight Bearing Restrictions: No  Pain: unrated pain reported in L foot when drying foot after shower, provided decreased pressure and repositioned foot as needed.     Therapy/Group: Individual Therapy  [name] [facility] 04/24/2023, 12:01 PM

## 2023-04-24 NOTE — Progress Notes (Signed)
PROGRESS NOTE   Subjective/Complaints: C/o headaches Tramadol makes her sleepy Asks for Nurtec but we do not carry that here Topamax ordered prn instead  ROS: Patient denies fever, rash, sore throat, blurred vision, dizziness, nausea, vomiting, diarrhea, cough, shortness of breath or chest pain, joint or back/neck pain, headache, or mood change. +headaches    Objective:   No results found. No results for input(s): "WBC", "HGB", "HCT", "PLT" in the last 72 hours.       No results for input(s): "NA", "K", "CL", "CO2", "GLUCOSE", "BUN", "CREATININE", "CALCIUM" in the last 72 hours.        Intake/Output Summary (Last 24 hours) at 04/24/2023 1021 Last data filed at 04/24/2023 0800 Gross per 24 hour  Intake 716 ml  Output 150 ml  Net 566 ml           Physical Exam: Vital Signs Blood pressure 112/72, pulse 69, temperature 97.6 F (36.4 C), temperature source Oral, resp. rate 18, height 5\' 1"  (1.549 m), weight 53.6 kg, SpO2 100%.   Constitutional: No distress . Vital signs reviewed. HEENT: NCAT, EOMI, oral membranes moist Neck: supple Cardiovascular: RRR without murmur. No JVD    Respiratory/Chest: CTA Bilaterally without wheezes or rales. Normal effort    GI/Abdomen: BS +, non-tender, non-distended Ext: no clubbing, cyanosis, or edema Psych: flat but generally pleasant and cooperative  Skin: + Scaling dried skin on bilateral heels left buttock unstageable - not examined + Left heel stage III DTI has resolved  Neurologic: AAOx2,  to year but not month or date. Follows most simple commands.  + Hypophonation, speech intelligability much improved. Fair insight. MAS 1 right elbow,MAS  tr to 1 in RIght finger and wrist flexors  MAS 1 in extensors of LE's MAS 1 L elbow , MAS 1+ in L wrist and finger flexors. Hypersensitivity to left hand improved, Left sided strength improved, decreased range of motion and  pain at end range of motion in left shoulder--stable 12/10  Musculoskeletal: limited bilateral shoulder and elbow ROM due to increased tone ,right shoulder pain with PROM    Assessment/Plan: 1. Functional deficits which require 3+ hours per day of interdisciplinary therapy in a comprehensive inpatient rehab setting. Physiatrist is providing close team supervision and 24 hour management of active medical problems listed below. Physiatrist and rehab team continue to assess barriers to discharge/monitor patient progress toward functional and medical goals  Care Tool:  Bathing    Body parts bathed by patient: Face, Abdomen, Chest, Left upper leg, Right upper leg, Left arm, Front perineal area, Right arm, Right lower leg, Left lower leg   Body parts bathed by helper: Buttocks Body parts n/a: Right arm, Left arm, Front perineal area, Buttocks, Right upper leg, Left upper leg, Right lower leg, Left lower leg   Bathing assist Assist Level: Minimal Assistance - Patient > 75%     Upper Body Dressing/Undressing Upper body dressing   What is the patient wearing?: Pull over shirt    Upper body assist Assist Level: Set up assist    Lower Body Dressing/Undressing Lower body dressing      What is the patient wearing?: Incontinence brief, Pants  Lower body assist Assist for lower body dressing: Moderate Assistance - Patient 50 - 74%     Toileting Toileting Toileting Activity did not occur (Clothing management and hygiene only): N/A (no void or bm)  Toileting assist Assist for toileting: Moderate Assistance - Patient 50 - 74%     Transfers Chair/bed transfer  Transfers assist  Chair/bed transfer activity did not occur: Safety/medical concerns  Chair/bed transfer assist level: Minimal Assistance - Patient > 75%     Locomotion Ambulation   Ambulation assist   Ambulation activity did not occur: Safety/medical concerns  Assist level: Minimal Assistance - Patient >  75% Assistive device: Walker-rolling Max distance: 4ft   Walk 10 feet activity   Assist  Walk 10 feet activity did not occur: Safety/medical concerns  Assist level: Minimal Assistance - Patient > 75% Assistive device: Walker-rolling   Walk 50 feet activity   Assist Walk 50 feet with 2 turns activity did not occur: Safety/medical concerns  Assist level: Moderate Assistance - Patient - 50 - 74% Assistive device: Walker-rolling    Walk 150 feet activity   Assist Walk 150 feet activity did not occur: Safety/medical concerns         Walk 10 feet on uneven surface  activity   Assist Walk 10 feet on uneven surfaces activity did not occur: Safety/medical concerns         Wheelchair     Assist Is the patient using a wheelchair?: Yes Type of Wheelchair: Manual    Wheelchair assist level: Dependent - Patient 0% Max wheelchair distance: 17ft    Wheelchair 50 feet with 2 turns activity    Assist    Wheelchair 50 feet with 2 turns activity did not occur: Safety/medical concerns   Assist Level: Dependent - Patient 0%   Wheelchair 150 feet activity     Assist  Wheelchair 150 feet activity did not occur: Safety/medical concerns   Assist Level: Dependent - Patient 0%   Blood pressure 112/72, pulse 69, temperature 97.6 F (36.4 C), temperature source Oral, resp. rate 18, height 5\' 1"  (1.549 m), weight 53.6 kg, SpO2 100%.  Medical Problem List and Plan: 1. 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 Awaiting approval of disability. SW has been working tirelessly with TOC/others on placement. Disability  remains  sticking point Code status  DNR -Continue CIR therapies including PT, OT, and SLP, discussed that she continues to make progress, transferred with supervision today!!  Team conference 12/4  2.  Impaired mobility: continue Lovenox 30mg  daily, weekly creatinine ordered             -antiplatelet therapy: N/A  3. Pain:  -Knee pain: Tylenol as needed, continue voltaren gel scheduled. XR reviewed and is stable, discussed with therapy -Right wrist swelling: XR ordered and shows no acute fracture, shows 2.70mm ulnar variance, brace removed to given patient a break from it. Voltaren gel ordered prn for discomfort, continue, CT ordered and show no acute osseus injury, continue ace wrap   -Foot pain: tramadol ordered prn, improved today, continue prn  -Headaches: continue tylenol and tramadol prn -Low back  pain: continue kpad, asked [place] to please bring for her -Whole body hypersensitivity: gabapentin 100mg  TID ordered -Pain in left heel: XR obtained and negative, cam boot ordered to help offload heel with therapy, discussed that this has helped -R shoulder pain: XR neg for acute etiology on 04/06/23, voltaren gel ordered--encourage ROM as tolerated  4. History of anxiety: discussed with her mom that she feels this was a big risk factor for her accident, propanolol d/ced due to hypotension. Continue magnesium supplement  5. Neuropsych/cognition: This patient is not capable of making decisions on her own behalf  - 10/12: Still not capable of medical decision making; BUT oriented and communicating, following directions - MUCH improved - PRN melatonin 3mg    for insomnia  6. Skin: -Buttock/sacrum--foam dressing, pressure relief, nutrition -stage 3 left heel, continue foam dressing/ pressure relief  7. Fluids/Electrolytes/Nutrition: Routine in and outs with follow-up chemistries  -Eating  well with cueing   -Dysphagia: continue D3/thins--tolerating this diet -Low protein stores: d/c prosource since  patient hates these and is eating 100% of meals -Suboptimal vitamin D level: increase to 5,000 U daily. continue this dose  -History of magnesium deficiency: magnesium 500mg  stopped to support BP  -12/9 encourage appropriate PO, no labs today 8.  Cavitary right lower lobe pneumonia likely aspiration pneumonia/MRSA pneumonia.  Resolved    Latest Ref Rng & Units 04/18/2023    5:16 AM 04/04/2023    6:09 AM 02/05/2023   11:12 AM  CBC  WBC 4.0 - 10.5 K/uL 6.0  5.6  8.5   Hemoglobin 12.0 - 15.0 g/dL 30.8  65.7  84.6   Hematocrit 36.0 - 46.0 % 41.6  42.1  46.7   Platelets 150 - 400 K/uL 194  224  284      9.  History of drug abuse.  Positive cocaine on urine drug screen.  Provide counseling  10.  AKI/hypovolemia and ATN.  Resolved     Latest Ref Rng & Units 04/18/2023    5:16 AM 04/04/2023    6:09 AM 04/02/2023    3:39 PM  BMP  Glucose 70 - 99 mg/dL 79  91  85   BUN 6 - 20 mg/dL 17  20  17    Creatinine 0.44 - 1.00 mg/dL 9.62  9.52  8.41   Sodium 135 - 145 mmol/L 137  138  136   Potassium 3.5 - 5.1 mmol/L 3.8  3.9  4.1   Chloride 98 - 111 mmol/L 105  105  100   CO2 22 - 32 mmol/L 23  24  26    Calcium 8.9 - 10.3 mg/dL 9.0  9.3  8.8     11.  Mild transaminitis with rhabdomyolysis.  Both resolved  12.  Hypotension: resolved  13. HTN: resolved   14. Impaired initiation: continue amantadine 100mg  BID  15. Hyperglycemia: CBGs mildly elevated, d/ced checks. Prosource d/ced as per patient's request  16. Spasticity:  -discussed serial casting with OT and her mom but she defers given pain she had when this was attempted -baclofen and tizanidine decreased due to hypotension, spasticity has been stable. Continue wrist, elbow brace, and PRAFOs. Ordered a night splint for LLE  -tizanidine decreased to 2mg  HS  -decrease baclofen to 15mg  HS  -12/9 spasticity as a whole much improved.   17.  Dyskinetic movements in head and neck - appears to be myoclonic jerks related to hypoxic brain  injury - as discussed with pt mom, no specific meds for this on antispasticity meds ,  consioder benzo although with pt's SUD would avoid this class - EEG reviewed and was negative for seizure.  -decrease baclofen to 10mg , resolved  18. Cervical extensor weakness: decrease tizanidine to 2mg  HS, decrease baclofen to 10mg , improved, continue this dose  19. Bowel and bladder incontinence: continue bowel and bladder program. D/c miralax, decrease tizanidine to 2mg  HS, continue this dose  20. Fatigue: resolved, discussed with team may be secondary to anti-spasticity medications, B12 reviewed and is 335, in suboptimal range, will give 1,000U B12 injection 8/7, will order monthly as needed, metanx started, increase to BID, continue, decrease tizanidine to 2mg  HS, discussed with patient, decrease baclofen to 15mg  HS, continue this dose  -12/9 doing well. Continue meds at above dosing 21. MRSA nares positive: negative on repeat, precautions d/ced  22. Tachycardic: resolved, d/c propanolol.  Decrease tizanidine to 2mg  HS. Resolved, amantadine increased back to 100mg  BID, continue this dose, resolved  23. S/p fall: CT head reviewed and is stable  24. Bilateral foot drop: bilateral AFOs ordered, discussed that these can help her to walk better  25. Vaginal itching: completed fluconazole. Having some discharge now. See below  26. >100k E Coli UTI  -11/28 sensitive to amoxil, completed 1 week course  27. Poor sitting balance: continue OT/PT, improving  28. Headaches: topamax 81m daily prn ordered  29. Drowsiness with tramadol: d/ced      LOS: 157 days A FACE TO FACE EVALUATION WAS PERFORMED  Nicole [name] P Orby Tangen 04/24/2023, 10:21 AM

## 2023-04-24 NOTE — Progress Notes (Signed)
Physical Therapy Session Note  Patient Details  Name: Nicole [name] MRN: [id number] Date of Birth: October 30, 1975  Today's Date: 04/24/2023 PT Individual Time: 1000-1042 + 1445*-1530 PT Individual Time Calculation (min): 42 min  + 45 min  Short Term Goals: Week 1:  PT Short Term Goal 1 (Week 1): Pt will improve bed mobility to minA consistently PT Short Term Goal 1 - Progress (Week 1): Not met PT Short Term Goal 2 (Week 1): Pt will complete functional outcome measure to assess balance and falls risk PT Short Term Goal 2 - Progress (Week 1): Not met PT Short Term Goal 3 (Week 1): pt will ambulate 164ft with modA and LRAD PT Short Term Goal 3 - Progress (Week 1): Not met  Skilled Therapeutic Interventions/Progress Updates:      1st session: Pt sitting upright in wheelchair - reports high levels of pain (9/10) and headache pain (7/10). She reports increased fatigue and drowsiness from the Tramadol she received. She requests an alternative medication. She also requests Nurtec for migraines - MD and medical team made aware of side effects and requests via secure chat.   Pt transported to main rehab gym. Sit<>stand to RW with CGA and ambulates with light minA and RW ~87ft + ~42ft. Cues for upright posture and increasing stride length.   Pt positioned into hooklying position with moist heat pack under her lower back for pain management. Worked on stretching her lower back with knee to chest in supine, knee fall outs, and then prone on elbows and prone on stomach.  Pt returned to her room at wheelchair level. Patient requesting to lie down due to pain and fatigue. MinA for stand pivot transfer. Placed aquathermia heating pad under her back to help with relief. Left with all needs met, call bell in reach. Bed alarm on.   2nd session: Pt in bed to start. Reports improvement in her back pain. Rated at 5/10. Rest breaks and mobility provided for pain management.   When asked, patient reports she needs  to toilet. Supine<>sitting EOB with minA. Donned tennis shoes with totalA. Sit<>stand to RW with CGA and ambulates with minA and RW to the bathroom - cues for safety approach and turning all the way before sitting on toilet. Despite + time and running shower water, patient unable to void. Charted in flowsheets.   Ambulated to day room rehab gym from her room,120ft with CGA/minA and RW. Cues for raising L step length/height and keeping upright posture throughout. Patient assisted onto [employer] and used seat belt for added safety. She completed x10 minutes at L40cm/sec resistance with intermittent rest breaks as needed. Cues for full AROM in BLE. Pt wanting to shoot the basketball into the goal so she did so while seated with CGA for balance.   Ambulated back to her room with CGA/minA and RW 142ft and patient assisted to bed with minA. Cues needed for symmetrical lying in the bed and modA for boosting to [facility] with instruction on bridging to assist. All needs met at end with alarm on.     Therapy Documentation Precautions:  Precautions Precautions: Fall Precaution Comments: delayed processing, Increased tone UB and LB Required Braces or Orthoses: Other Brace Other Brace: B adjustable night splints; B elbow "cosey" splints; L palm protector; R mitt Restrictions Weight Bearing Restrictions: No   Therapy/Group: Individual Therapy  Orrin Brigham 04/24/2023, 7:42 AM

## 2023-04-24 NOTE — Progress Notes (Signed)
Patient refused bilateral hand splint and bilateral AFO. Patient refused foam dressing to sacrum and bilateral heels for protection. Patient educated on risk/benefits. Patient verbalized understanding.

## 2023-04-24 NOTE — Progress Notes (Signed)
Patient ID: Nicole [name], female   DOB: [birth year], 47 y.o.   MRN: [id number]  Spoke with Mom via telephone who reports they have changed medicaid worker to [employer] [phone] and she did receive a letter form [employer] for a updated consent form singed and to allow one of their MD to evaluate pt to determine her disability. Mom plans to call today since only has until the 14 th to contact them.

## 2023-04-24 NOTE — Progress Notes (Signed)
Speech Language Pathology Daily Session Note  Patient Details  Name: Nicole [name] MRN: [id number] Date of Birth: 04-13-1976  Today's Date: 04/24/2023 SLP Individual Time: 2952-8413 SLP Individual Time Calculation (min): 58 min  Short Term Goals: Week 9: SLP Short Term Goal 1 (Week 9): Week 22 - Pt will solve basic environmental problems with 90% accuracy given supervision assist. SLP Short Term Goal 2 (Week 9): Week 22 -Patient will utilize external aids for orientation to place and time with modified independence. SLP Short Term Goal 3 (Week 9): Week 22 - Pt will demonstrate sustained attention to basic tasks for 45 minutes with supervision A multimodal cues for redirection. SLP Short Term Goal 4 (Week 9): Week 22 - Pt will utilize increased vocal intensity w/ supervision verbal cues to remain 90% intelligibile at conversation level  Skilled Therapeutic Interventions: Skilled therapy session focused on cognitive goals. SLP facilitated session by providing cues during creation of Christmas list. Patient able to verbalize appropriate Christmas list, though required up to modA to type list slow in order to increase accuracy and demonstrate awareness of mistakes. SLP targeted orientation through encouraging use of calendar. Patient identified current day, date, month and year given supervisionA. Patient left in bed with alarm set and call bell in reach. Continue POC.   Pain None reported   Therapy/Group: Individual Therapy  Jadis Pitter M.A., CF-SLP 04/24/2023, 12:45 PM

## 2023-04-25 MED ORDER — BACLOFEN 5 MG HALF TABLET
5.0000 mg | ORAL_TABLET | Freq: Every day | ORAL | Status: DC
  Administered 2023-04-25 – 2023-05-01 (×7): 5 mg via ORAL
  Filled 2023-04-25 (×8): qty 1

## 2023-04-25 NOTE — Progress Notes (Signed)
Physical Therapy Session Note  Patient Details  Name: Nicole [name] MRN: [id number] Date of Birth: [birth year]  Today's Date: 04/25/2023 PT Individual Time: 1300-1345 PT Individual Time Calculation (min): 45 min   Short Term Goals: Week 1:  PT Short Term Goal 1 (Week 1): Pt will improve bed mobility to minA consistently PT Short Term Goal 1 - Progress (Week 1): Not met PT Short Term Goal 2 (Week 1): Pt will complete functional outcome measure to assess balance and falls risk PT Short Term Goal 2 - Progress (Week 1): Not met PT Short Term Goal 3 (Week 1): Pt will ambulate 173ft with CGA and LRAD PT Short Term Goal 3 - Progress (Week 1): Not met  Skilled Therapeutic Interventions/Progress Updates:      Therapy Documentation Precautions:  Precautions Precautions: Fall Precaution Comments: delayed processing, Increased tone UB and LB Required Braces or Orthoses: Other Brace Other Brace: B adjustable night splints; B elbow "cosey" splints; L palm protector; R mitt Restrictions Weight Bearing Restrictions: No  Pt agreeable to PT session with emphasis on balance assessment and training to increase functional mobility. Pt with unrated left heel pain, PT dependently donned CAM boot for relief.   Pt CGA with sit<>stand in session with mild retropulsion and requires min cues for postural control and optimal alignment.   Requires CGA to intermittent min A for turning and obstacle negotiatiion with gait ~125 ft x 2 from hospital room <>dayroom with min cues for walker proximity.   PT administered BERG balance assessment, see below for test results and deficits:    04/25/23 1327  Balance  Balance Assessed Yes  Standardized Balance Assessment  Standardized Balance Assessment Berg Balance Test  Berg Balance Test  Sit to Stand 2  Standing Unsupported 0  Sitting with Back Unsupported but Feet Supported on Floor or Stool 4  Stand to Sit 2  Transfers 1  Standing Unsupported with Eyes Closed  3  Standing Ubsupported with Feet Together 0  From Standing, Reach Forward with Outstretched Arm 0  From Standing Position, Pick up Object from Floor 0  From Standing Position, Turn to Look Behind Over each Shoulder 1  Turn 360 Degrees 0  Standing Unsupported, Alternately Place Feet on Step/Stool 0  Standing Unsupported, One Foot in Front 0  Standing on One Leg 0  Total Score 13   Patient demonstrates increased fall risk as noted by score of   13/56 on Berg Balance Scale.  (<36= high risk for falls, close to 100%; 37-45 significant >80%; 46-51 moderate >50%; 52-55 lower >25%)  Pt returned to room and incontinent of bladder, requires min A for toileting and left seated at bedside with all needs in reach and alarm on.   Therapy/Group: Individual Therapy  Truitt Leep Truitt Leep PT, DPT  04/25/2023, 9:41 AM

## 2023-04-25 NOTE — Progress Notes (Signed)
Physical Therapy Session Note  Patient Details  Name: Nicole [name] MRN: [id number] Date of Birth: [birth year]  Today's Date: 04/25/2023 PT Individual Time: 1000-1057 PT Individual Time Calculation (min): 57 min   Short Term Goals: Week 1:  PT Short Term Goal 1 (Week 1): Pt will improve bed mobility to minA consistently PT Short Term Goal 1 - Progress (Week 1): Not met PT Short Term Goal 2 (Week 1): Pt will complete functional outcome measure to assess balance and falls risk PT Short Term Goal 2 - Progress (Week 1): Not met PT Short Term Goal 3 (Week 1): pt will ambulate 130ft with modA and LRAD PT Short Term Goal 3 - Progress (Week 1): Not met  Skilled Therapeutic Interventions/Progress Updates:      Pt sitting upright in wheelchair. Patient in agreement to therapy. Continues to report having a migraine. Treatment to tolerance and rest breaks provided as needed.   Transported patient off CIR floor to outside under awning (raining) to work on [employer] and gait in unfamiliar environment. CGA for sit<>Stand to RW and ambulates with minA and RW while outdoors. Safety cues for managing her environment and minA for turning with ambulating on sloped surfaces.   Instructed patient in ordering a coffee from [employer] to work on community integration and self confidence. Standing in line > 5 minutes with RW with SBA without LOB. Demonstrated adequate social awareness in setting - able to order coffee without cues or assist. Standing at the counter while she placed in sugar and cream into coffee - PT needing to stabilize coffee while she added condiments due to impaired GMC/FMC. PT adding ice to help cool coffee. Pt appreciative.   Returned upstairs to [employer] floor. Assisted into standing within the // bars on a large blue foam wedge with minA. Foam wedge used to help promote heel cord stretching in a weight bearing position. Used large mirror to help with visual aid and feedback for postural  awareness.   Returned to her room and patient left sitting upright in wheelchair with chair pad alarm on and all needs met.   Therapy Documentation Precautions:  Precautions Precautions: Fall Precaution Comments: delayed processing, Increased tone UB and LB Required Braces or Orthoses: Other Brace Other Brace: B adjustable night splints; B elbow "cosey" splints; L palm protector; R mitt Restrictions Weight Bearing Restrictions: No General:    Therapy/Group: Individual Therapy  Orrin Brigham 04/25/2023, 7:53 AM

## 2023-04-25 NOTE — Progress Notes (Signed)
Occupational Therapy Weekly Progress Note  Patient Details  Name: Nicole [name] MRN: [id number] Date of Birth: 02-10-1976  Beginning of progress report period: April 18, 2023 End of progress report period: April 25, 2023   Patient has met 1 of 3 short term goals.  Pt continues to make significant goals weekly with functional ADL's and mobility. Pt no longer requiring L CAM boot for painful L distal extremity and wearing tennis shoes with elastic laces inserted for ease of donning. Pt has improved her overall insight into her deficits with increased consistency with call button use for assistance. Pt now tolerating increased short distance amb with RW with cues, assistance and support for basic ADL routine, consistent shower and toilet access with DME and AE. Trunk control in sitting and standing with motor planning and balance gains noted. Basic pull on clothing, grooming tasks and routine transfers and mobility improved but requires cues for falls prevention and limited safety awareness. Pt requires ongoing skilled OT to continue to facilitate safety and functional ability.   Patient continues to demonstrate the following deficits: muscle weakness and muscle joint tightness, decreased cardiorespiratoy endurance, impaired timing and sequencing, abnormal tone, unbalanced muscle activation, motor apraxia, ataxia, decreased coordination, and decreased motor planning, decreased midline orientation, decreased attention to left, and decreased motor planning, decreased initiation, decreased attention, decreased awareness, decreased problem solving, decreased safety awareness, decreased memory, and delayed processing, and decreased sitting balance, decreased standing balance, decreased postural control, hemiplegia, and decreased balance strategies and therefore will continue to benefit from skilled OT intervention to enhance overall performance with BADL, iADL, Vocation, and Reduce care partner  burden.  Patient progressing toward long term goals..  Continue plan of care.  OT Short Term Goals {[id number]}   See functional performance for 04/25/23 note.   Vicenta Dunning 04/25/2023, 9:54 AM

## 2023-04-25 NOTE — Progress Notes (Signed)
Speech Language Pathology Daily Session Note  Patient Details  Name: Nicole [name] MRN: [id number] Date of Birth: January 05, 1976  Today's Date: 04/25/2023 SLP Individual Time: 1430-1515 SLP Individual Time Calculation (min): 45 min  Short Term Goals: Week 9: SLP Short Term Goal 1 (Week 9): Week 22 - Pt will solve basic environmental problems with 90% accuracy given supervision assist. SLP Short Term Goal 2 (Week 9): Week 22 -Patient will utilize external aids for orientation to place and time with modified independence. SLP Short Term Goal 3 (Week 9): Week 22 - Pt will demonstrate sustained attention to basic tasks for 45 minutes with supervision A multimodal cues for redirection. SLP Short Term Goal 4 (Week 9): Week 22 - Pt will utilize increased vocal intensity w/ supervision verbal cues to remain 90% intelligibile at conversation level  Skilled Therapeutic Interventions: SLP conducted skilled therapy session targeting cognitive retraining goals. Patient in bed upon SLP arrival and as SLP entered, stated "I need to go to the bathroom", however was voiding before she could get up from the bed. SLP requested personal care assistance from NT. During personal care assistance, patient followed directions and participated in safety precautions for safe and fast cleanup with supervision. SLP transported patient to speech therapy office and conducted basic to mildly complex cognitive scanning/problem solving task. Patient completed task with min assist overall. Patient sustained attention for ~30 minutes with supervision and then stated that she was "exhausted". After this statement, requiring min assist for continued sustained attention. Patient's speech was 80% intelligible at the conversation level this date, especially as she became fatigued. Patient was left in lowered bed with call bell in reach and bed alarm set. SLP will continue to target goals per plan of care.      Pain Pain Assessment Pain Scale:  0-10 Pain Score: 0-No pain  Therapy/Group: Individual Therapy  [name], M.A., CCC-SLP  [name] 04/25/2023, 3:44 PM

## 2023-04-25 NOTE — Progress Notes (Signed)
Physical Therapy Weekly Progress Note  Patient Details  Name: BRENIYA CABELLO MRN: [id number] Date of Birth: 1976/04/02  Beginning of progress report period: April 16, 2023 End of progress report period: April 25, 2023   Ms Hanigan is making significant progress with functional mobility, cognition, awareness, and posture. She is currently minA for bed mobility, minA for stand pivot transfers, CGA for straight path gait using RW and minA for turns, she requires modA for navigating x4 (6") stairs. She is no longer requiring the CAM boot on her L. She is demonstrating improved postural control in sitting and standing, improved understanding and insight of her deficits and limitations, and increased stamina with functional mobility.   Patient continues to demonstrate the following deficits muscle weakness and muscle joint tightness, decreased cardiorespiratoy endurance, impaired timing and sequencing, abnormal tone, unbalanced muscle activation, and motor apraxia, decreased motor planning, decreased initiation, decreased attention, decreased awareness, decreased problem solving, decreased safety awareness, decreased memory, and delayed processing, and decreased sitting balance, decreased standing balance, decreased postural control, hemiplegia, and decreased balance strategies and therefore will continue to benefit from skilled PT intervention to increase functional independence with mobility.  Patient progressing toward long term goals..  Continue plan of care. Goals upgraded to CGA/minA overall. See PT POC note for details  PT Short Term Goals Week 1:  PT Short Term Goal 1 (Week 1): Pt will improve bed mobility to minA consistently PT Short Term Goal 1 - Progress (Week 1): Not met PT Short Term Goal 2 (Week 1): Pt will complete functional outcome measure to assess balance and falls risk PT Short Term Goal 2 - Progress (Week 1): Not met PT Short Term Goal 3 (Week 1): Pt will ambulate 183ft with CGA  and LRAD PT Short Term Goal 3 - Progress (Week 1): Not met  Therapy Documentation Precautions:  Precautions Precautions: Fall Precaution Comments: delayed processing, Increased tone UB and LB Required Braces or Orthoses: Other Brace Other Brace: B adjustable night splints; B elbow "cosey" splints; L palm protector; R mitt Restrictions Weight Bearing Restrictions: No General:     Therapy/Group: Individual Therapy  Wanetta Funderburke P Jamiel Goncalves  PT, DPT, CSRS  04/25/2023, 7:56 AM

## 2023-04-25 NOTE — Progress Notes (Signed)
Recreational Therapy Session Note  Patient Details  Name: NORELI FREYMAN MRN: [id number] Date of Birth: 1976-04-13 Today's Date: 04/25/2023  No c/o  Pt participated in animal assisted activity seated w/c level.  Pt more interactive today than previous visits.  Pt petting the dog, asking questions.  Pt appreciative of this visit.   Gopal Malter 04/25/2023, 3:43 PM

## 2023-04-25 NOTE — Progress Notes (Signed)
PROGRESS NOTE   Subjective/Complaints: No new complaints this morning  ROS: Patient denies fever, rash, sore throat, blurred vision, dizziness, nausea, vomiting, diarrhea, cough, shortness of breath or chest pain, joint or back/neck pain, headache, or mood change. +headaches, improved spasticity    Objective:   No results found. No results for input(s): "WBC", "HGB", "HCT", "PLT" in the last 72 hours.       No results for input(s): "NA", "K", "CL", "CO2", "GLUCOSE", "BUN", "CREATININE", "CALCIUM" in the last 72 hours.        Intake/Output Summary (Last 24 hours) at 04/25/2023 0950 Last data filed at 04/25/2023 0700 Gross per 24 hour  Intake 657 ml  Output --  Net 657 ml           Physical Exam: Vital Signs Blood pressure 103/66, pulse 70, temperature 98.3 F (36.8 C), resp. rate 18, height 5\' 1"  (1.549 m), weight 53.6 kg, SpO2 99%.   Constitutional: No distress . Vital signs reviewed. HEENT: NCAT, EOMI, oral membranes moist Neck: supple Cardiovascular: RRR without murmur. No JVD    Respiratory/Chest: CTA Bilaterally without wheezes or rales. Normal effort    GI/Abdomen: BS +, non-tender, non-distended Ext: no clubbing, cyanosis, or edema Psych: flat but generally pleasant and cooperative  Skin: + Scaling dried skin on bilateral heels left buttock unstageable - not examined + Left heel stage III DTI has resolved  Neurologic: AAOx2,  to year but not month or date. Follows most simple commands.  + Hypophonation, speech intelligability much improved. Fair insight. MAS 1 right elbow,MAS  tr to 1 in RIght finger and wrist flexors  MAS 1 in extensors of LE's MAS 1 L elbow , MAS 1+ in L wrist and finger flexors. Hypersensitivity to left hand improved, Left sided strength improved, decreased range of motion and pain at end range of motion in left shoulder--stable 12/11  Musculoskeletal: limited bilateral  shoulder and elbow ROM due to increased tone ,right shoulder pain with PROM    Assessment/Plan: 1. Functional deficits which require 3+ hours per day of interdisciplinary therapy in a comprehensive inpatient rehab setting. Physiatrist is providing close team supervision and 24 hour management of active medical problems listed below. Physiatrist and rehab team continue to assess barriers to discharge/monitor patient progress toward functional and medical goals  Care Tool:  Bathing    Body parts bathed by patient: Face, Abdomen, Chest, Left upper leg, Right upper leg, Left arm, Front perineal area, Right arm, Right lower leg, Left lower leg   Body parts bathed by helper: Buttocks Body parts n/a: Right arm, Left arm, Front perineal area, Buttocks, Right upper leg, Left upper leg, Right lower leg, Left lower leg   Bathing assist Assist Level: Minimal Assistance - Patient > 75%     Upper Body Dressing/Undressing Upper body dressing   What is the patient wearing?: Pull over shirt    Upper body assist Assist Level: Minimal Assistance - Patient > 75%    Lower Body Dressing/Undressing Lower body dressing      What is the patient wearing?: Incontinence brief, Pants     Lower body assist Assist for lower body dressing: Minimal Assistance - Patient >  75%     Toileting Toileting Toileting Activity did not occur (Acupuncturist and hygiene only): N/A (no void or bm)  Toileting assist Assist for toileting: Moderate Assistance - Patient 50 - 74%     Transfers Chair/bed transfer  Transfers assist  Chair/bed transfer activity did not occur: Safety/medical concerns  Chair/bed transfer assist level: Minimal Assistance - Patient > 75%     Locomotion Ambulation   Ambulation assist   Ambulation activity did not occur: Safety/medical concerns  Assist level: Minimal Assistance - Patient > 75% Assistive device: Walker-rolling Max distance: 56ft   Walk 10 feet  activity   Assist  Walk 10 feet activity did not occur: Safety/medical concerns  Assist level: Minimal Assistance - Patient > 75% Assistive device: Walker-rolling   Walk 50 feet activity   Assist Walk 50 feet with 2 turns activity did not occur: Safety/medical concerns  Assist level: Moderate Assistance - Patient - 50 - 74% Assistive device: Walker-rolling    Walk 150 feet activity   Assist Walk 150 feet activity did not occur: Safety/medical concerns         Walk 10 feet on uneven surface  activity   Assist Walk 10 feet on uneven surfaces activity did not occur: Safety/medical concerns         Wheelchair     Assist Is the patient using a wheelchair?: Yes Type of Wheelchair: Manual    Wheelchair assist level: Dependent - Patient 0% Max wheelchair distance: 77ft    Wheelchair 50 feet with 2 turns activity    Assist    Wheelchair 50 feet with 2 turns activity did not occur: Safety/medical concerns   Assist Level: Dependent - Patient 0%   Wheelchair 150 feet activity     Assist  Wheelchair 150 feet activity did not occur: Safety/medical concerns   Assist Level: Dependent - Patient 0%   Blood pressure 103/66, pulse 70, temperature 98.3 F (36.8 C), resp. rate 18, height 5\' 1"  (1.549 m), weight 53.6 kg, SpO2 99%.  Medical Problem List and Plan: 1. 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 Awaiting approval of disability. SW has been working tirelessly with TOC/others on placement. Disability  remains sticking point Code status  DNR -Continue CIR therapies including PT, OT, and SLP, discussed that  she continues to make progress, transferred with supervision today!!  Team conference 12/4  2.  Impaired mobility: continue Lovenox 30mg  daily, weekly creatinine ordered             -antiplatelet therapy: N/A  3. Pain:  -Knee pain: Tylenol as needed, continue voltaren gel scheduled. XR reviewed and is stable, discussed with therapy -Right wrist swelling: XR ordered and shows no acute fracture, shows 2.2mm ulnar variance, brace removed to given patient a break from it. Voltaren gel ordered prn for discomfort, continue, CT ordered and show no acute osseus injury, continue ace wrap   -Foot pain: tramadol ordered prn, improved today, continue prn  -Headaches: continue tylenol and tramadol prn -Low back pain: continue kpad, asked [place] to please bring for her -Whole body hypersensitivity: gabapentin 100mg  TID ordered -  Pain in left heel: XR obtained and negative, cam boot ordered to help offload heel with therapy, discussed that this has helped -R shoulder pain: XR neg for acute etiology on 04/06/23, voltaren gel ordered--encourage ROM as tolerated  4. History of anxiety: discussed with her mom that she feels this was a big risk factor for her accident, propanolol d/ced due to hypotension. Continue magnesium supplement  5. Neuropsych/cognition: This patient is not capable of making decisions on her own behalf  - 10/12: Still not capable of medical decision making; BUT oriented and communicating, following directions - MUCH improved - PRN melatonin 3mg    for insomnia  6. Skin: -Buttock/sacrum--foam dressing, pressure relief, nutrition -stage 3 left heel, continue foam dressing/ pressure relief  7. Fluids/Electrolytes/Nutrition: Routine in and outs with follow-up chemistries  -Eating  well with cueing   -Dysphagia: continue D3/thins--tolerating this diet -Low protein stores: d/c prosource since patient hates these and is eating 100% of meals -Suboptimal vitamin D level: increase to 5,000 U  daily. continue this dose  -History of magnesium deficiency: magnesium 500mg  stopped to support BP  -12/9 encourage appropriate PO, no labs today 8.  Cavitary right lower lobe pneumonia likely aspiration pneumonia/MRSA pneumonia.  Resolved    Latest Ref Rng & Units 04/18/2023    5:16 AM 04/04/2023    6:09 AM 02/05/2023   11:12 AM  CBC  WBC 4.0 - 10.5 K/uL 6.0  5.6  8.5   Hemoglobin 12.0 - 15.0 g/dL 32.4  40.1  02.7   Hematocrit 36.0 - 46.0 % 41.6  42.1  46.7   Platelets 150 - 400 K/uL 194  224  284      9.  History of drug abuse.  Positive cocaine on urine drug screen.  Provide counseling  10.  AKI/hypovolemia and ATN.  Resolved     Latest Ref Rng & Units 04/18/2023    5:16 AM 04/04/2023    6:09 AM 04/02/2023    3:39 PM  BMP  Glucose 70 - 99 mg/dL 79  91  85   BUN 6 - 20 mg/dL 17  20  17    Creatinine 0.44 - 1.00 mg/dL 2.53  6.64  4.03   Sodium 135 - 145 mmol/L 137  138  136   Potassium 3.5 - 5.1 mmol/L 3.8  3.9  4.1   Chloride 98 - 111 mmol/L 105  105  100   CO2 22 - 32 mmol/L 23  24  26    Calcium 8.9 - 10.3 mg/dL 9.0  9.3  8.8     11.  Mild transaminitis with rhabdomyolysis.  Both resolved  12.  Hypotension: resolved  13. HTN: resolved   14. Impaired initiation: continue amantadine 100mg  BID  15. Hyperglycemia: CBGs mildly elevated, d/ced checks. Prosource d/ced as per patient's request  16. Spasticity:  -discussed serial casting with OT and her mom but she defers given pain she had when this was attempted -baclofen and tizanidine decreased due to hypotension, spasticity has been stable. Continue wrist, elbow brace, and PRAFOs. Ordered a night splint for LLE  -tizanidine decreased to 2mg  HS  -decrease baclofen to 15mg  HS  -12/9 spasticity as a whole much improved.   17.  Dyskinetic movements in head and neck - appears to be myoclonic jerks related to hypoxic brain injury - as discussed with pt mom, no specific meds for this on antispasticity meds , consioder benzo  although with pt's SUD would avoid this class - EEG reviewed and was negative  for seizure.  -decrease baclofen to 10mg , resolved  18. Cervical extensor weakness: decrease tizanidine to 2mg  HS, decrease baclofen to 10mg , improved, continue this dose  19. Bowel and bladder incontinence: continue bowel and bladder program. D/c miralax, decrease tizanidine to 2mg  HS, continue this dose  20. Fatigue: resolved, discussed with team may be secondary to anti-spasticity medications, B12 reviewed and is 335, in suboptimal range, will give 1,000U B12 injection 8/7, will order monthly as needed, metanx started, increase to BID, continue, decrease tizanidine to 2mg  HS, discussed with patient, decrease baclofen to 15mg  HS, continue this dose  -12/9 doing well. Continue meds at above dosing 21. MRSA nares positive: negative on repeat, precautions d/ced  22. Tachycardic: resolved, d/c propanolol.  Decrease tizanidine to 2mg  HS. Resolved, amantadine increased back to 100mg  BID, continue this dose, resolved  23. S/Nicole fall: CT head reviewed and is stable  24. Bilateral foot drop: bilateral AFOs ordered, discussed that these can help her to walk better  25. Vaginal itching: completed fluconazole. Having some discharge now. See below  26. >100k E Coli UTI  -11/28 sensitive to amoxil, completed 1 week course  27. Poor sitting balance: continue OT/PT, improving  28. Headaches: topamax 41m daily prn ordered, continue  29. Drowsiness with tramadol: d/ced      LOS: 158 days A FACE TO FACE EVALUATION WAS PERFORMED  Nicole [name] Nicole [name] 04/25/2023, 9:50 AM

## 2023-04-25 NOTE — Progress Notes (Signed)
Occupational Therapy Session Note  Patient Details  Name: Nicole [name] MRN: [id number] Date of Birth: 12/14/1975  Today's Date: 04/25/2023 OT Individual Time: 4098-1191 OT Individual Time Calculation (min): 46 min    Short Term Goals: Week 2:  OT Short Term Goal 1 (Week 2): Week 20:  Patient will don sock over right foot with min assist for trunk control OT Short Term Goal 1 - Progress (Week 2): Other (comment) OT Short Term Goal 2 (Week 2): Week 20 Patient will demonstrate safe sitting balance while completing familiar BADL task with min cueing   Skilled Therapeutic Interventions/Progress Updates:   Pt seen for skilled OT this am. Focus on RW use for ADL routine set up with assist for reaching and balance to choose clothes in drawers and closet. Slipper socks donned by OT for time management. Moved to EOB with S. Min cues and CGA for sit to stand with RW. CGA/min A to amb to dresser then closet. Mod cues for body organization, pacing and general motor planning for task. Then full toileting, sponge bathing and dressing/grooming sink side w/c and standing level as able with OT support during dynamic tasks. Pt requested toileting and amb to toilet with min A for brief management. Min cues to sit squarely and safely on toilet with large BM and voiding with set up for front peri region hygiene and mod A for buttocks and brief management. Amb back to sink side w/c set up with shampoo cap applied as pt reported she had syrup from am meal in hair. Set up for oral care with oral rinse in standing with CGA as well as cues for hand placement and L UE integration. Deodorant, pull over shirt and hair brushing with set up and min cues. Pants, socks and tennis shoes with increased time, mod cues and mod fading to min A overall as elastic laces set up in shoes. Pt refused L LE CAM boot this am. Emergent need for voiding again and w/c level transport and grab bar use for CGA SPT. Handoff from toilet for 2nd voiding  to NT to complete due to session ending.   Pain: 6/10 HA with warm shampoo cap with massage and hot coffee and pain meds taken, L LE with warm wash cloth for bathing and repositioning for relief   Therapy Documentation Precautions:  Precautions Precautions: Fall Precaution Comments: delayed processing, Increased tone UB and LB Required Braces or Orthoses: Other Brace Other Brace: B adjustable night splints; B elbow "cosey" splints; L palm protector; R mitt Restrictions Weight Bearing Restrictions: No  Therapy/Group: Individual Therapy  Vicenta Dunning 04/25/2023, 7:55 AM

## 2023-04-25 NOTE — Progress Notes (Signed)
Physical Therapy Session Note  Patient Details  Name: Nicole [name] MRN: [id number] Date of Birth: 1975-12-26  Today's Date: 04/25/2023 PT Individual Time: 1350-1415 PT Individual Time Calculation (min): 25 min   Short Term Goals: Week 1:  PT Short Term Goal 1 (Week 1): Pt will improve bed mobility to minA consistently PT Short Term Goal 1 - Progress (Week 1): Not met PT Short Term Goal 2 (Week 1): Pt will complete functional outcome measure to assess balance and falls risk PT Short Term Goal 2 - Progress (Week 1): Not met PT Short Term Goal 3 (Week 1): Pt will ambulate 138ft with CGA and LRAD PT Short Term Goal 3 - Progress (Week 1): Not met  Skilled Therapeutic Interventions/Progress Updates:       Pt sitting upright in wheelchair. Reports mild headache pain, unrated. Assisted patient back to bed, per her request, to allow her to rest. Ambulatory transfer with minA and B HHA around the foot of the bed and around to the other side. Cues for upright posture, adequate stride length, and safety awareness while turning. She can complete sit>supine without assist. In supine position, worked on stretching her BLE for hamstring, hip flexors, quads, heel cords, groin, and hip rotators. All completed passively. Most intolerance to LLE stretching, especially hip flexor and quads. ++ clonus on LLE while stretching heel cord. Pt ended session in bed with alarm on, call bell in reach.    Therapy Documentation Precautions:  Precautions Precautions: Fall Precaution Comments: delayed processing, Increased tone UB and LB Required Braces or Orthoses: Other Brace Other Brace: B adjustable night splints; B elbow "cosey" splints; L palm protector; R mitt Restrictions Weight Bearing Restrictions: No General:  Therapy/Group: Individual Therapy  Tanvir Hipple P Shanekia Latella 04/25/2023, 2:15 PM

## 2023-04-26 MED ORDER — ENSURE ENLIVE PO LIQD
237.0000 mL | Freq: Three times a day (TID) | ORAL | Status: DC
  Administered 2023-04-26: 237 mL via ORAL

## 2023-04-26 NOTE — Progress Notes (Signed)
Speech Language Pathology Weekly Progress Note  Patient Details  Name: Nicole [name] MRN: [id number] Date of Birth: 22-Mar-1976  Beginning of progress report period: April 20, 2023 End of progress report period: April 26, 2023  Short Term Goals: Week 9: SLP Short Term Goal 1 (Week 9): Week 22 - Pt will solve basic environmental problems with 90% accuracy given supervision assist. SLP Short Term Goal 1 - Progress (Week 9): Met SLP Short Term Goal 2 (Week 9): Week 22 -Patient will utilize external aids for orientation to place and time with modified independence. SLP Short Term Goal 2 - Progress (Week 9): Met SLP Short Term Goal 3 (Week 9): Week 22 - Pt will demonstrate sustained attention to basic tasks for 45 minutes with supervision A multimodal cues for redirection. SLP Short Term Goal 3 - Progress (Week 9): Not met SLP Short Term Goal 4 (Week 9): Week 22 - Pt will utilize increased vocal intensity w/ supervision verbal cues to remain 90% intelligibile at conversation level SLP Short Term Goal 4 - Progress (Week 9): Not met  New Short Term Goals: Week 9: SLP Short Term Goal 1 (Week 9): Week 23 - Pt will solve mildly complex environmental problems with 90% accuracy given min assist. SLP Short Term Goal 2 (Week 9): Week 23 - Pt will recall basic daily information using external aids with 75% accuracy given mod assist. SLP Short Term Goal 3 (Week 9): Week 23 - Pt will demonstrate sustained attention to basic tasks for 45 minutes with supervision A multimodal cues for redirection. SLP Short Term Goal 4 (Week 9): Week 23 - Pt will utilize increased vocal intensity w/ supervision verbal cues to remain 90% intelligibile at conversation level  Weekly Progress Updates: Patient has made modest gains towards therapy goals meeting 2/4 short term goals set this reporting period. Patient is currently solving basic environmental problems with supervision but requires up to mod assist for mildly complex  and/or unfamiliar tasks. Patient's completion of familiar tasks has greatly improved in accuracy. Patient's delayed recall is improving, with patient oriented to date with modified independence and recalling locations of important/preferred items in room, able to guide therapist to the location of items independently. Patient sustains attention to basic tasks for up to 30 minutes with supervision but requires min cues to achieve 45 minutes. Patient utilizes increased vocal intensity with supervision verbal cues and was 85% intelligible at most recent session, very close to meeting her goal of 90%. Patient continues to need cues to increase volume in louder environments specifically. Patient and family education ongoing. Patient will continue to benefit from skilled therapy services during remainder of CIR stay.    Intensity: Minumum of 1-2 x/day, 30 to 90 minutes Frequency: 3 to 5 out of 7 days Duration/Length of Stay: TBD due to SNF placement Treatment/Interventions: Cognitive remediation/compensation;Internal/external aids;Speech/Language facilitation;Cueing hierarchy;Environmental controls;Therapeutic Activities;Functional tasks;Multimodal communication approach;Patient/family education;Therapeutic Exercise  [name], M.A., CCC-SLP   [name] 04/26/2023, 7:59 AM

## 2023-04-26 NOTE — Group Note (Signed)
Patient Details Name: Nicole [name] MRN: [id number] DOB: January 22, 1976 Today's Date: 04/26/2023 Goal:  Pt will actively participate in 60 minute therapeutic dance group with min verbal cues for attention.  MET Group Description: Dance Group: Pt participated in dance group with an emphasis on social interaction, motor planning, increasing overall activity tolerance and bimanual tasks. All songs were selected by group members. Dance moves included AROM of BUE/BLE gross motor movements with an emphasis on building functional endurance.    Individual level documentation: Patient completed group from sitting level. Patient needed supervision to complete various dance moves with min cues for attention and modifications.  Patient often verbalized which movements were challenging for her and modified movements independently.    Pain:no c/o     Dragan Tamburrino 04/26/2023, 2:55 PM

## 2023-04-26 NOTE — Progress Notes (Signed)
Physical Therapy Session Note  Patient Details  Name: Nicole [name] MRN: [id number] Date of Birth: 08/16/1975  Today's Date: 04/26/2023 PT Individual Time: 2536-6440 PT Individual Time Calculation (min): 55 min   Short Term Goals: Week 1:  PT Short Term Goal 1 (Week 1): Pt will improve bed mobility to minA consistently PT Short Term Goal 1 - Progress (Week 1): Not met PT Short Term Goal 2 (Week 1): Pt will complete functional outcome measure to assess balance and falls risk PT Short Term Goal 2 - Progress (Week 1): Not met PT Short Term Goal 3 (Week 1): Pt will ambulate 176ft with CGA and LRAD PT Short Term Goal 3 - Progress (Week 1): Not met  Skilled Therapeutic Interventions/Progress Updates:       Pt sitting upright in wheelchair and agreeable to PT tx. No c/o pain . Transported to day room rehab gym for time management.   Gait training 144ft with CGA/minA and RW around nurses station. Worked on keeping postural control, keeping RW continuously moving, and general awareness of her environment while ambulating.   Worked on w/c mobility, using BLE only, to improve her functional independence. Pt having difficulty reaching the ground with both feet. Removed ROHO cushion to improve floor to seat heat and allow patient better contact with the ground. She was able to propel herself with supervision using BLE only. Cues for reciprocal stepping while foot propelling, equal AROM bilaterally. Pt able to turn 180 and 90deg with ++ time.   Pt participated in standing therapeutic activity to address standing balance, standing tolerance - completed Wii Bowling using her RUE to manage the Wii Remote. Patient required mod/max cues to start for instruction but faded to min cues as game progressed. She was able to tolerate standing for the length of 10 frames, ~5-10 minutes. Her balance faded from CGA in standing to minA for the last ~3 frames due to fatigue. Insufficient and inadequate stepping  strategies for balance recovery. Poor anticipatory response as well.   Pt returned to her room at end of treatment. Left sitting upright with call bell in reach and all her immediate needs met. Chair pad alarm on.    Therapy Documentation Precautions:  Precautions Precautions: Fall Precaution Comments: delayed processing, Increased tone UB and LB Required Braces or Orthoses: Other Brace Other Brace: B adjustable night splints; B elbow "cosey" splints; L palm protector; R mitt Restrictions Weight Bearing Restrictions: No General:      Therapy/Group: Individual Therapy  Orrin Brigham 04/26/2023, 7:57 AM

## 2023-04-26 NOTE — Progress Notes (Signed)
Physical Therapy Session Note  Patient Details  Name: Nicole [name] MRN: [id number] Date of Birth: 04/16/1976  Today's Date: 04/26/2023 PT Individual Time: 6440-3474 PT Individual Time Calculation (min): 43 min   Short Term Goals: Week 15: see flow sheet  Skilled Therapeutic Interventions/Progress Updates:    Pt seated in w/c on arrival and agreeable to therapy. Pt reports discomfort/stiffness in L calf, 3/10. Pt transported to therapy gym for time management and energy conservation. Pt participated in standing heel cord/calf stretch with toes on wedge 3 x ~30 sec, and reported improved stiffness in her LLE. Requested to go outside, so transported to [facility] entrance. Pt participated in 3 bouts of gait with CGA overall, min a at times for fatigue. Noted delayed processing of verbal instructions and extra cues for direction in less linear environment. Seated rest breaks on benches and w/c, with pt demoing good safety with reaching back, but sometimes confusing w/c brakes locked vs unlocked. Returned to unit and pt remained in w/c, was left with all needs in reach and alarm active.   Therapy Documentation Precautions:  Precautions Precautions: Fall Precaution Comments: delayed processing, Increased tone UB and LB Required Braces or Orthoses: Other Brace Other Brace: B adjustable night splints; B elbow "cosey" splints; L palm protector; R mitt Restrictions Weight Bearing Restrictions Per Provider Order: No General:      Therapy/Group: Individual Therapy  Juluis Rainier 04/26/2023, 12:10 PM

## 2023-04-26 NOTE — Group Note (Addendum)
Patient Details Name: Nicole [name] MRN: [id number] DOB: 1975/11/06 Today's Date: 04/26/2023  Time Calculation: OT Group Time Calculation OT Group Start Time: 1330 OT Group Stop Time: 1430 OT Group Time Calculation (min): 60 min      Group Description: Dance Group: Pt participated in dance group with an emphasis on social interaction, motor planning, increasing overall activity tolerance and bimanual tasks. All songs were selected by group members. Dance moves included AROM of BUE/BLE gross motor movements with an emphasis on building functional endurance.    Individual level documentation: Patient completed group from sitting level. Patientt needed supervision to complete various dance moves with cues for L U/LE positioning and motivation.  Patient needed min modifications during group.  Pain:  0/10  Precautions:  Falls  Nicole [name] 04/26/2023, 4:55 PM

## 2023-04-26 NOTE — Progress Notes (Signed)
Nutrition Follow-up  DOCUMENTATION CODES:   Not applicable  INTERVENTION:   -Continue dysphagia III diet with double protein portions all meals.  -Resume Ensure Enlive po TID, each supplement provides 350 kcal and 20 grams of protein.  -Suggest scheduled bowel regimen.    NUTRITION DIAGNOSIS:   Increased nutrient needs related to acute illness, other (see comment), wound healing (prolonged hospitalization, previously severe malnutrition) as evidenced by estimated needs.  -ongoing, addressing with meals and oral supplements  GOAL:   Patient will meet greater than or equal to 90% of their needs  -progressing  MONITOR:   PO intake, Labs, Supplement acceptance, Weight trends, Skin  REASON FOR ASSESSMENT:   Malnutrition Screening Tool    ASSESSMENT:   47 y.o. 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 PMH includes: anxiety, asthma, depression, drug abuse.  Intakes recorded average 81% x 8 meals. Continues with double protein portions all meals. Patient states she loves the Ensure and would like to receive again. It was discontinued 12/2. Appetite is fine per patient report. Weight from over one week ago continues to trend up, needs updated weight.  Medications reviewed and include vitamin B-complex with vitamin C-1 Tab daily, vitamin D3 25 mcg daily, magnesium gluconate 500 mg daily at bedtime.  Labs reviewed   Diet Order:   Diet Order             DIET DYS 3 Room service appropriate? Yes with Assist; Fluid consistency: Thin  Diet effective now                   EDUCATION NEEDS:   Not appropriate for education at this time  Skin:  Skin Assessment: Skin Integrity Issues: Skin Integrity Issues:: Stage III DTI: left heel, dressing intact Stage III: Left heel Unstageable: Left buttocks Other: ecchymosis, abrasion to L heel; ecchymosis to L thigh; erythema/redness  to buttocks  Skin Assessment: Skin Integrity Issues: Other: ecchymosis, abrasion to L heel; ecchymosis to L thigh; erythema/redness to buttocks  Last BM:  04/21/23, type 4-large  Height:   Ht Readings from Last 1 Encounters:  11/18/22 5\' 1"  (1.549 m)    Weight:   Wt Readings from Last 1 Encounters:  04/18/23 53.6 kg    Ideal Body Weight:  47.7 kg  BMI:  Body mass index is 22.33 kg/m.  Estimated Nutritional Needs:   Kcal:  1600-1800  Protein:  90-110 gm  Fluid:  >/= 1.6 L    [name], RDLD Clinical Dietitian If unable to reach, please contact "[facility] Inpatient" secure chat group between 8 am-4 pm daily"

## 2023-04-26 NOTE — Patient Care Conference (Signed)
Inpatient RehabilitationTeam Conference and Plan of Care Update Date: 04/25/2023   Time: 11:06 AM    Patient Name: Nicole [name]      Medical Record Number: [id number]  Date of Birth: Aug 04, 1975 Sex: Female         Room/Bed: 4W09C/4W09C-01 Payor Info: Payor: [employer] / Plan: [employer] / Product Type: *No Product type* /    Admit Date/Time:  11/18/2022  3:15 PM  Primary Diagnosis:  Hypoxic brain injury [facility])  Hospital Problems: Principal Problem:   Hypoxic brain injury (HCC) Active Problems:   Protein-calorie malnutrition, severe   Essential hypertension   Pressure injury of skin    Expected Discharge Date: Expected Discharge Date:  (SNF pending)  Team Members Present: Physician leading conference: Dr. Sula Soda Nurse Present: Chana Bode, RN PT Present: Wynelle Link, PT OT Present: Roney Mans, OT SLP Present: [name], SLP PPS Coordinator present : Fae Pippin, SLP     Current Status/Progress Goal Weekly Team Focus  Bowel/Bladder   Pt is currently continent with B/B with some incontinence episodes LBM 04/21/23   Pt will regain full continence of B/B   Time toileting and assist with toileting needs qshift/prn    Swallow/Nutrition/ Hydration   D3/thin   goal met       ADL's   MIN A UB ADLS, MIN A LB ADLS, assist needed mostly for problem solving/safety during ADLs. ambulatory ADL transfers with Rw and MINA. bahting with MIN A from shower seat   Goals min overall   safety/problem solving ADLS    Mobility   minA bed mobility, minA stand pivot transfers, minA gait 175ft with RW, min/modA for stairs. No longer needing CAM boot   Goals upgraded to CGA/minA  dynamic sitting balance, dynamic standing balance, postural control, stretching, gait training    Communication   decreased vocal intensity, improves given supervision A   modi   progress towards impriving intelligibility in variety of settings/situations     Safety/Cognition/ Behavioral Observations  supervision for temportal orientation, min-mod for basic problem solving/sustained attention for upwards of 45 minutes  supervision -minA   continue to address temporal orientation, basic problem solving, sustained attention for upwards of 45 minutes,    Pain   Verbalizes pain to mid back and left leg. Pain well managed with Tramadol 50mg    Pt will be free from pain   Assess pt for pain qshift/prn and provide education on PRN pain medication    Skin   Erythema/redness to bilateral buttocks with left heel DTI (HEALED)   Will maintain skin intergrity with no breakdown  Assess skin qshift/prn and provide education to prevent skin breakdown      Discharge Planning:  Continue to work with management on LOG approval to see if could obtain bed offer. Pt is making progress in her therapies, Disability is still pending   Team Discussion: Patient post anoxic brain injury. Making progress with basic problem solving and continues to work on vocal intensity.  Patient on target to meet rehab goals: yes, currently needs min assist for bed mobility and stand pivot transfers. Able to ambulate using a RW up to 150' with min assist and needs mod assist for steps.   *See Care Plan and progress notes for long and short-term goals.   Revisions to Treatment Plan:  N/a   Teaching Needs: Safety, medications, skin care, transfers, etc.   Current Barriers to Discharge: Home enviroment access/layout, Wound care, and Lack of/limited family support  Possible Resolutions to Barriers: SNF pending     Medical Summary Current Status: spasticity improved, UTI  Barriers to Discharge: Medical stability  Barriers to Discharge Comments: polypharmacy, hypotension, UTI Possible Resolutions to [employer] Focus: decrease baclofen to 10mg  HS, continue tizanidine 2mg  HS, completed antibiotic course   Continued Need for Acute Rehabilitation Level of Care: The  patient requires daily medical management by a physician with specialized training in physical medicine and rehabilitation for the following reasons: Direction of a multidisciplinary physical rehabilitation program to maximize functional independence : Yes Medical management of patient stability for increased activity during participation in an intensive rehabilitation regime.: Yes Analysis of laboratory values and/or radiology reports with any subsequent need for medication adjustment and/or medical intervention. : Yes   I attest that I was present, lead the team conference, and concur with the assessment and plan of the team.   Chana Bode B 04/26/2023, 1:23 PM

## 2023-04-26 NOTE — Progress Notes (Signed)
PROGRESS NOTE   Subjective/Complaints: No new complaints this morning  ROS: Patient denies fever, rash, sore throat, blurred vision, dizziness, nausea, vomiting, diarrhea, cough, shortness of breath or chest pain, joint or back/neck pain, headache, or mood change. +headaches, improved spasticity    Objective:   No results found. No results for input(s): "WBC", "HGB", "HCT", "PLT" in the last 72 hours.       No results for input(s): "NA", "K", "CL", "CO2", "GLUCOSE", "BUN", "CREATININE", "CALCIUM" in the last 72 hours.        Intake/Output Summary (Last 24 hours) at 04/26/2023 1322 Last data filed at 04/26/2023 1249 Gross per 24 hour  Intake 497 ml  Output --  Net 497 ml           Physical Exam: Vital Signs Blood pressure 111/78, pulse 99, temperature 98.1 F (36.7 C), resp. rate 16, height 5\' 1"  (1.549 m), weight 53.6 kg, SpO2 97%.   Constitutional: No distress . Vital signs reviewed. HEENT: NCAT, EOMI, oral membranes moist Neck: supple Cardiovascular: RRR without murmur. No JVD    Respiratory/Chest: CTA Bilaterally without wheezes or rales. Normal effort    GI/Abdomen: BS +, non-tender, non-distended Ext: no clubbing, cyanosis, or edema Psych: flat but generally pleasant and cooperative  Skin: + Scaling dried skin on bilateral heels left buttock unstageable - not examined + Left heel stage III DTI has resolved  Neurologic: AAOx2,  to year but not month or date. Follows most simple commands.  + Hypophonation, speech intelligability much improved. Fair insight. MAS 1 right elbow,MAS  tr to 1 in RIght finger and wrist flexors  MAS 1 in extensors of LE's MAS 1 L elbow , MAS 1+ in L wrist and finger flexors. Hypersensitivity to left hand improved, Left sided strength improved, decreased range of motion and pain at end range of motion in left shoulder--stable 12/12  Musculoskeletal: limited bilateral  shoulder and elbow ROM due to increased tone ,right shoulder pain with PROM    Assessment/Plan: 1. Functional deficits which require 3+ hours per day of interdisciplinary therapy in a comprehensive inpatient rehab setting. Physiatrist is providing close team supervision and 24 hour management of active medical problems listed below. Physiatrist and rehab team continue to assess barriers to discharge/monitor patient progress toward functional and medical goals  Care Tool:  Bathing    Body parts bathed by patient: Face, Abdomen, Chest, Left upper leg, Right upper leg, Left arm, Front perineal area, Right arm, Right lower leg, Left lower leg   Body parts bathed by helper: Buttocks Body parts n/a: Right arm, Left arm, Front perineal area, Buttocks, Right upper leg, Left upper leg, Right lower leg, Left lower leg   Bathing assist Assist Level: Minimal Assistance - Patient > 75%     Upper Body Dressing/Undressing Upper body dressing   What is the patient wearing?: Pull over shirt    Upper body assist Assist Level: Minimal Assistance - Patient > 75%    Lower Body Dressing/Undressing Lower body dressing      What is the patient wearing?: Incontinence brief, Pants     Lower body assist Assist for lower body dressing: Minimal Assistance - Patient >  75%     Toileting Toileting Toileting Activity did not occur (Acupuncturist and hygiene only): N/A (no void or bm)  Toileting assist Assist for toileting: Moderate Assistance - Patient 50 - 74%     Transfers Chair/bed transfer  Transfers assist  Chair/bed transfer activity did not occur: Safety/medical concerns  Chair/bed transfer assist level: Minimal Assistance - Patient > 75%     Locomotion Ambulation   Ambulation assist   Ambulation activity did not occur: Safety/medical concerns  Assist level: Minimal Assistance - Patient > 75% Assistive device: Walker-rolling Max distance: 150'   Walk 10 feet  activity   Assist  Walk 10 feet activity did not occur: Safety/medical concerns  Assist level: Minimal Assistance - Patient > 75% Assistive device: Walker-rolling   Walk 50 feet activity   Assist Walk 50 feet with 2 turns activity did not occur: Safety/medical concerns  Assist level: Minimal Assistance - Patient > 75% Assistive device: Walker-rolling    Walk 150 feet activity   Assist Walk 150 feet activity did not occur: Safety/medical concerns  Assist level: Minimal Assistance - Patient > 75% Assistive device: Walker-rolling    Walk 10 feet on uneven surface  activity   Assist Walk 10 feet on uneven surfaces activity did not occur: Safety/medical concerns   Assist level: Minimal Assistance - Patient > 75% Assistive device: Walker-rolling   Wheelchair     Assist Is the patient using a wheelchair?: Yes Type of Wheelchair: Manual    Wheelchair assist level: Total Assistance - Patient < 25% Max wheelchair distance: 2ft    Wheelchair 50 feet with 2 turns activity    Assist    Wheelchair 50 feet with 2 turns activity did not occur: Safety/medical concerns   Assist Level: Maximal Assistance - Patient 25 - 49%   Wheelchair 150 feet activity     Assist  Wheelchair 150 feet activity did not occur: Safety/medical concerns   Assist Level: Maximal Assistance - Patient 25 - 49%   Blood pressure 111/78, pulse 99, temperature 98.1 F (36.7 C), resp. rate 16, height 5\' 1"  (1.549 m), weight 53.6 kg, SpO2 97%.  Medical Problem List and Plan: 1. 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 Awaiting approval of  disability. SW has been working tirelessly with TOC/others on placement. Disability  remains sticking point Code status  DNR -Continue CIR therapies including PT, OT, and SLP, discussed that she continues to make progress, transferred with supervision today!!  Team conference 12/4  2.  Impaired mobility: continue Lovenox 30mg  daily, weekly creatinine ordered             -antiplatelet therapy: N/A  3. Pain:  -Knee pain: Tylenol as needed, continue voltaren gel scheduled. XR reviewed and is stable, discussed with therapy -Right wrist swelling: XR ordered and shows no acute fracture, shows 2.101mm ulnar variance, brace removed to given patient a break from it. Voltaren gel ordered prn for discomfort, continue, CT ordered and show no acute osseus injury, continue ace wrap   -Foot pain: tramadol ordered prn, improved today, continue prn  -Headaches: continue tylenol and tramadol  prn -Low back pain: continue kpad, asked [place] to please bring for her -Whole body hypersensitivity: gabapentin 100mg  TID ordered -Pain in left heel: XR obtained and negative, cam boot ordered to help offload heel with therapy, discussed that this has helped -R shoulder pain: XR neg for acute etiology on 04/06/23, voltaren gel ordered--encourage ROM as tolerated  4. History of anxiety: discussed with her mom that she feels this was a big risk factor for her accident, propanolol d/ced due to hypotension. Continue magnesium supplement  5. Neuropsych/cognition: This patient is not capable of making decisions on her own behalf  - 10/12: Still not capable of medical decision making; BUT oriented and communicating, following directions - MUCH improved - PRN melatonin 3mg    for insomnia  6. Skin: -Buttock/sacrum--foam dressing, pressure relief, nutrition -stage 3 left heel, continue foam dressing/ pressure relief  7. Fluids/Electrolytes/Nutrition: Routine in and outs with follow-up chemistries  -Eating  well with cueing    -Dysphagia: continue D3/thins--tolerating this diet -Low protein stores: d/c prosource since patient hates these and is eating 100% of meals -Suboptimal vitamin D level: increase to 5,000 U daily. continue this dose  -History of magnesium deficiency: magnesium 500mg  stopped to support BP  -12/9 encourage appropriate PO, no labs today 8.  Cavitary right lower lobe pneumonia likely aspiration pneumonia/MRSA pneumonia.  Resolved    Latest Ref Rng & Units 04/18/2023    5:16 AM 04/04/2023    6:09 AM 02/05/2023   11:12 AM  CBC  WBC 4.0 - 10.5 K/uL 6.0  5.6  8.5   Hemoglobin 12.0 - 15.0 g/dL 16.1  09.6  04.5   Hematocrit 36.0 - 46.0 % 41.6  42.1  46.7   Platelets 150 - 400 K/uL 194  224  284      9.  History of drug abuse.  Positive cocaine on urine drug screen.  Provide counseling  10.  AKI/hypovolemia and ATN.  Resolved     Latest Ref Rng & Units 04/18/2023    5:16 AM 04/04/2023    6:09 AM 04/02/2023    3:39 PM  BMP  Glucose 70 - 99 mg/dL 79  91  85   BUN 6 - 20 mg/dL 17  20  17    Creatinine 0.44 - 1.00 mg/dL 4.09  8.11  9.14   Sodium 135 - 145 mmol/L 137  138  136   Potassium 3.5 - 5.1 mmol/L 3.8  3.9  4.1   Chloride 98 - 111 mmol/L 105  105  100   CO2 22 - 32 mmol/L 23  24  26    Calcium 8.9 - 10.3 mg/dL 9.0  9.3  8.8     11.  Mild transaminitis with rhabdomyolysis.  Both resolved  12.  Hypotension: resolved  13. HTN: resolved   14. Impaired initiation: continue amantadine 100mg  BID  15. Hyperglycemia: CBGs mildly elevated, d/ced checks. Prosource d/ced as per patient's request  16. Spasticity:  -discussed serial casting with OT and her mom but she defers given pain she had when this was attempted -baclofen and tizanidine decreased due to hypotension, spasticity has been stable. Continue wrist, elbow brace, and PRAFOs. Ordered a night splint for LLE  -tizanidine decreased to 2mg  HS  -decrease baclofen to 15mg  HS  -12/9 spasticity as a whole much improved.   17.   Dyskinetic movements in head and neck - appears to be myoclonic jerks related to hypoxic brain injury - as discussed with pt mom, no specific meds for this  on antispasticity meds , consioder benzo although with pt's SUD would avoid this class - EEG reviewed and was negative for seizure.  -decrease baclofen to 10mg , resolved  18. Cervical extensor weakness: decrease tizanidine to 2mg  HS, decrease baclofen to 10mg , improved, continue this dose  19. Bowel and bladder incontinence: continue bowel and bladder program. D/c miralax, decrease tizanidine to 2mg  HS, continue this dose  20. Fatigue: resolved, discussed with team may be secondary to anti-spasticity medications, B12 reviewed and is 335, in suboptimal range, will give 1,000U B12 injection 8/7, will order monthly as needed, metanx started, increase to BID, continue, decrease tizanidine to 2mg  HS, discussed with patient, decrease baclofen to 10mg  HS   21. MRSA nares positive: negative on repeat, precautions d/ced  22. Tachycardic: resolved, d/c propanolol.  Decrease tizanidine to 2mg  HS. Resolved, amantadine increased back to 100mg  BID, continue this dose, resolved  23. S/Nicole fall: CT head reviewed and is stable  24. Bilateral foot drop: bilateral AFOs ordered, discussed that these can help her to walk better  25. Vaginal itching: completed fluconazole. Having some discharge now. See below  26. >100k E Coli UTI  -11/28 sensitive to amoxil, completed 1 week course  27. Poor sitting balance: continue OT/PT, improving  28. Headaches: topamax 40m daily prn ordered, continue  29. Drowsiness with tramadol: d/ced      LOS: 159 days A FACE TO FACE EVALUATION WAS PERFORMED  Nicole [name] Nicole [name] 04/26/2023, 1:22 PM

## 2023-04-26 NOTE — Progress Notes (Signed)
Occupational Therapy Session Note  Patient Details  Name: Nicole [name] MRN: [id number] Date of Birth: [birth year]  Today's Date: 04/26/2023 OT Individual Time: 4098-1191 OT Individual Time Calculation (min): 49 min   Short Term Goals: See flowsheets   Skilled Therapeutic Interventions/Progress Updates:  Pt greeted resting in bed for skilled OT session with focus on .   Pain: Pt with un-rated pain in B feet, OT offering intermediate rest breaks and positioning suggestions throughout session to address pain/fatigue and maximize participation/safety in session.   Functional Transfers: STS and ambulating toilet and walk-in shower transfer with CGA + youth RW, cuing for safe hand placement.   Self Care Tasks: UB dressing/bathing with setup A-Min A due to L-shoulder stiffness. LB dressing/bathing with Mod A for LLE due to pain/stiffness and standing garment hiking over bottom/hips. Pt toilets with A for pericare, stating ". . . Not today" when prompted to initiate task.   Seated sink-side oral care with min questioning cuing for sequencing.   Pt remained in care of NT with 4Ps assessed and immediate needs met. Pt continues to be appropriate for skilled OT intervention to promote further functional independence in ADLs/IADLs.   Therapy Documentation Precautions:  Precautions Precautions: Fall Precaution Comments: delayed processing, Increased tone UB and LB Required Braces or Orthoses: Other Brace Other Brace: B adjustable night splints; B elbow "cosey" splints; L palm protector; R mitt Restrictions Weight Bearing Restrictions: No   Therapy/Group: Individual Therapy  Lou Cal, OTR/L, MSOT  04/26/2023, 5:26 AM

## 2023-04-27 NOTE — Progress Notes (Addendum)
Physical Therapy Session Note  Patient Details  Name: Nicole [name] MRN: [id number] Date of Birth: 1976-03-12  Today's Date: 04/27/2023 PT Individual Time: 0807-0900 PT Individual Time Calculation (min): 53 min   Short Term Goals: Week 15  Skilled Therapeutic Interventions/Progress Updates: Pt presents sitting in w/c and agreeable to therapy.  Pt wheeled to dayroom for time conservation.  Pt transfers sit to stand w/ CGA although stand to sit requires increased verbal cues for safety.  Pt amb multiple trials w/ min A x 180' w/ verbal cues for posture and scanning.  Without cues pt veers to Left.  Pt performed standing hooking hoseshoes over Bball hoop w/ min A and cues for speed and safety.  Pt performs horseshoe toss crossing midline using B UES.  Pt attempted standing on 2" platform w/ RLE, but unable to perform safely.  Pt brushed teeth at sink w/ supervision, seated in w/c.  Pt amb back to room and remained seated in w/c w/ chair alarm on and all needs in reach.  Missed time 7' for nursing care.     Therapy Documentation Precautions:  Precautions Precautions: Fall Precaution Comments: delayed processing, Increased tone UB and LB Required Braces or Orthoses: Other Brace Other Brace: B adjustable night splints; B elbow "cosey" splints; L palm protector; R mitt Restrictions Weight Bearing Restrictions Per Provider Order: No General:   Vital Signs:   Pain: no c/o.       Therapy/Group: Individual Therapy  [name] 04/27/2023, 9:02 AM

## 2023-04-27 NOTE — Progress Notes (Signed)
PROGRESS NOTE   Subjective/Complaints: No new complaints this morning Asks for a robe for when she uses the bathroom Tolerated therapy well today  ROS: Patient denies fever, rash, sore throat, blurred vision, dizziness, nausea, vomiting, diarrhea, cough, shortness of breath or chest pain, joint or back/neck pain, headache, or mood change. +headaches, improved spasticity    Objective:   No results found. No results for input(s): "WBC", "HGB", "HCT", "PLT" in the last 72 hours.       No results for input(s): "NA", "K", "CL", "CO2", "GLUCOSE", "BUN", "CREATININE", "CALCIUM" in the last 72 hours.        Intake/Output Summary (Last 24 hours) at 04/27/2023 1342 Last data filed at 04/27/2023 1257 Gross per 24 hour  Intake 600 ml  Output --  Net 600 ml           Physical Exam: Vital Signs Blood pressure 118/66, pulse 88, temperature 98.2 F (36.8 C), temperature source Oral, resp. rate 16, height 5\' 1"  (1.549 m), weight 53.6 kg, SpO2 99%.   Constitutional: No distress . Vital signs reviewed. HEENT: NCAT, EOMI, oral membranes moist Neck: supple Cardiovascular: RRR without murmur. No JVD    Respiratory/Chest: CTA Bilaterally without wheezes or rales. Normal effort    GI/Abdomen: BS +, non-tender, non-distended Ext: no clubbing, cyanosis, or edema Psych: flat but generally pleasant and cooperative  Skin: + Scaling dried skin on bilateral heels left buttock unstageable - not examined + Left heel stage III DTI has resolved  Neurologic: AAOx2,  to year but not month or date. Follows most simple commands.  + Hypophonation, speech intelligability much improved. Fair insight. MAS 1 right elbow,MAS  tr to 1 in RIght finger and wrist flexors  MAS 1 in extensors of LE's MAS 1 L elbow , MAS 1+ in L wrist and finger flexors. Hypersensitivity to left hand improved, Left sided strength improved, decreased range of  motion and pain at end range of motion in left shoulder-stable 12/13  Musculoskeletal: limited bilateral shoulder and elbow ROM due to increased tone ,right shoulder pain with PROM    Assessment/Plan: 1. Functional deficits which require 3+ hours per day of interdisciplinary therapy in a comprehensive inpatient rehab setting. Physiatrist is providing close team supervision and 24 hour management of active medical problems listed below. Physiatrist and rehab team continue to assess barriers to discharge/monitor patient progress toward functional and medical goals  Care Tool:  Bathing    Body parts bathed by patient: Face, Abdomen, Chest, Left upper leg, Right upper leg, Left arm, Front perineal area, Right arm, Right lower leg, Left lower leg   Body parts bathed by helper: Buttocks Body parts n/a: Right arm, Left arm, Front perineal area, Buttocks, Right upper leg, Left upper leg, Right lower leg, Left lower leg   Bathing assist Assist Level: Minimal Assistance - Patient > 75%     Upper Body Dressing/Undressing Upper body dressing   What is the patient wearing?: Pull over shirt    Upper body assist Assist Level: Minimal Assistance - Patient > 75%    Lower Body Dressing/Undressing Lower body dressing      What is the patient wearing?: Incontinence brief, Pants  Lower body assist Assist for lower body dressing: Minimal Assistance - Patient > 75%     Toileting Toileting Toileting Activity did not occur (Clothing management and hygiene only): N/A (no void or bm)  Toileting assist Assist for toileting: Moderate Assistance - Patient 50 - 74%     Transfers Chair/bed transfer  Transfers assist  Chair/bed transfer activity did not occur: Safety/medical concerns  Chair/bed transfer assist level: Minimal Assistance - Patient > 75%     Locomotion Ambulation   Ambulation assist   Ambulation activity did not occur: Safety/medical concerns  Assist level: Minimal  Assistance - Patient > 75% Assistive device: Walker-rolling Max distance: 180   Walk 10 feet activity   Assist  Walk 10 feet activity did not occur: Safety/medical concerns  Assist level: Minimal Assistance - Patient > 75% Assistive device: Walker-rolling   Walk 50 feet activity   Assist Walk 50 feet with 2 turns activity did not occur: Safety/medical concerns  Assist level: Minimal Assistance - Patient > 75% Assistive device: Walker-rolling    Walk 150 feet activity   Assist Walk 150 feet activity did not occur: Safety/medical concerns  Assist level: Minimal Assistance - Patient > 75% Assistive device: Walker-rolling    Walk 10 feet on uneven surface  activity   Assist Walk 10 feet on uneven surfaces activity did not occur: Safety/medical concerns   Assist level: Minimal Assistance - Patient > 75% Assistive device: Walker-rolling   Wheelchair     Assist Is the patient using a wheelchair?: Yes Type of Wheelchair: Manual    Wheelchair assist level: Total Assistance - Patient < 25% Max wheelchair distance: 84ft    Wheelchair 50 feet with 2 turns activity    Assist    Wheelchair 50 feet with 2 turns activity did not occur: Safety/medical concerns   Assist Level: Maximal Assistance - Patient 25 - 49%   Wheelchair 150 feet activity     Assist  Wheelchair 150 feet activity did not occur: Safety/medical concerns   Assist Level: Maximal Assistance - Patient 25 - 49%   Blood pressure 118/66, pulse 88, temperature 98.2 F (36.8 C), temperature source Oral, resp. rate 16, height 5\' 1"  (1.549 m), weight 53.6 kg, SpO2 99%.  Medical Problem List and Plan: 1. 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 Awaiting approval of disability. SW has been working tirelessly with TOC/others on placement. Disability  remains sticking point Code status  DNR -Continue CIR therapies including PT, OT, and SLP, discussed that she continues to make progress, transferred with supervision today!!  Team conference 12/4  2.  Impaired mobility: continue Lovenox 30mg  daily, weekly creatinine ordered             -antiplatelet therapy: N/A  3. Pain:  -Knee pain: Tylenol as needed, continue voltaren gel scheduled. XR reviewed and is stable, discussed with therapy -Right wrist swelling: XR ordered and shows no acute fracture, shows 2.64mm ulnar variance, brace removed to given patient a break from it. Voltaren gel ordered prn for discomfort, continue, CT ordered and show no acute osseus injury, continue ace wrap   -  Foot pain: tramadol ordered prn, improved today, continue prn  -Headaches: continue tylenol and tramadol prn -Low back pain: continue kpad, asked [place] to please bring for her -Whole body hypersensitivity: gabapentin 100mg  TID ordered -Pain in left heel: XR obtained and negative, cam boot ordered to help offload heel with therapy, discussed that this has helped -R shoulder pain: XR neg for acute etiology on 04/06/23, voltaren gel ordered--encourage ROM as tolerated  4. History of anxiety: discussed with her mom that she feels this was a big risk factor for her accident, propanolol d/ced due to hypotension. Continue magnesium supplement  5. Neuropsych/cognition: This patient is not capable of making decisions on her own behalf  - 10/12: Still not capable of medical decision making; BUT oriented and communicating, following directions - MUCH improved - PRN melatonin 3mg    for insomnia  6. Skin: -Buttock/sacrum--foam dressing, pressure relief, nutrition -stage 3 left heel, continue foam dressing/ pressure  relief  7. Fluids/Electrolytes/Nutrition: Routine in and outs with follow-up chemistries  -Eating  well with cueing   -Dysphagia: continue D3/thins--tolerating this diet -Low protein stores: d/c prosource since patient hates these and is eating 100% of meals -Suboptimal vitamin D level: increase to 5,000 U daily. continue this dose  -History of magnesium deficiency: magnesium 500mg  stopped to support BP  -12/9 encourage appropriate PO, no labs today 8.  Cavitary right lower lobe pneumonia likely aspiration pneumonia/MRSA pneumonia.  Resolved    Latest Ref Rng & Units 04/18/2023    5:16 AM 04/04/2023    6:09 AM 02/05/2023   11:12 AM  CBC  WBC 4.0 - 10.5 K/uL 6.0  5.6  8.5   Hemoglobin 12.0 - 15.0 g/dL 19.1  47.8  29.5   Hematocrit 36.0 - 46.0 % 41.6  42.1  46.7   Platelets 150 - 400 K/uL 194  224  284      9.  History of drug abuse.  Positive cocaine on urine drug screen.  Provide counseling  10.  AKI/hypovolemia and ATN.  Resolved     Latest Ref Rng & Units 04/18/2023    5:16 AM 04/04/2023    6:09 AM 04/02/2023    3:39 PM  BMP  Glucose 70 - 99 mg/dL 79  91  85   BUN 6 - 20 mg/dL 17  20  17    Creatinine 0.44 - 1.00 mg/dL 6.21  3.08  6.57   Sodium 135 - 145 mmol/L 137  138  136   Potassium 3.5 - 5.1 mmol/L 3.8  3.9  4.1   Chloride 98 - 111 mmol/L 105  105  100   CO2 22 - 32 mmol/L 23  24  26    Calcium 8.9 - 10.3 mg/dL 9.0  9.3  8.8     11.  Mild transaminitis with rhabdomyolysis.  Both resolved  12.  Hypotension: resolved  13. HTN: resolved   14. Impaired initiation: continue amantadine 100mg  BID  15. Hyperglycemia: CBGs mildly elevated, d/ced checks. Prosource d/ced as per patient's request  16. Spasticity:  -discussed serial casting with OT and her mom but she defers given pain she had when this was attempted -baclofen and tizanidine decreased due to hypotension, spasticity has been stable. Continue wrist, elbow brace, and PRAFOs. Ordered a night splint for  LLE  -tizanidine decreased to 2mg  HS  Continue baclofen 10mg  HS  -  17.  Dyskinetic movements in head and neck - appears to be myoclonic jerks related to hypoxic brain injury - as discussed with  pt mom, no specific meds for this on antispasticity meds , consioder benzo although with pt's SUD would avoid this class - EEG reviewed and was negative for seizure.  -decrease baclofen to 10mg , resolved  18. Cervical extensor weakness: decrease tizanidine to 2mg  HS, decrease baclofen to 10mg , improved, continue this dose  19. Bowel and bladder incontinence: continue bowel and bladder program. D/c miralax, decrease tizanidine to 2mg  HS, continue this dose  20. Fatigue: resolved, discussed with team may be secondary to anti-spasticity medications, B12 reviewed and is 335, in suboptimal range, will give 1,000U B12 injection 8/7, will order monthly as needed, metanx started, increase to BID, continue, decrease tizanidine to 2mg  HS, discussed with patient, decrease baclofen to 10mg  HS   21. MRSA nares positive: negative on repeat, precautions d/ced  22. Tachycardic: resolved, d/c propanolol.  Decrease tizanidine to 2mg  HS. Resolved, amantadine increased back to 100mg  BID, continue this dose, resolved  23. S/p fall: CT head reviewed and is stable  24. Bilateral foot drop: bilateral AFOs ordered, discussed that these can help her to walk better  25. Vaginal itching: completed fluconazole. Having some discharge now. See below  26. >100k E Coli UTI  -11/28 sensitive to amoxil, completed 1 week course  27. Poor sitting balance: continue OT/PT, improving  28. Headaches: topamax 35m daily prn ordered, continue  29. Drowsiness with tramadol: d/ced      LOS: 160 days A FACE TO FACE EVALUATION WAS PERFORMED  Nicole [name] P Saman Umstead 04/27/2023, 1:42 PM

## 2023-04-27 NOTE — Progress Notes (Signed)
Occupational Therapy Session Note  Patient Details  Name: NICHOLA IDEN MRN: [id number] Date of Birth: 14-Jun-1975  Today's Date: 04/27/2023 OT Individual Time: 1400-1430 OT Individual Time Calculation (min): 30 min    Short Term Goals: See Flowsheets  Skilled Therapeutic Interventions/Progress Updates:    OT intervention with focus on bed mobility, standing balance, following one step commands, and safety awareness to address cognitive and funcitonal deficits and prepare pt for discharge when space available at SNF. Standing task at window placing Squigz on window and naming colors. Pt also removed Squigz from window and placed on ledge. Pt donned gloves and cleaned Squigz. Mod verbal cues to use LUE to complete task. Table task with colored pegs. Pt required min verbal cues to replicate colored peg pattern on pegboard. Pt returned to room and requested to return to bed. Mod A for sit>supine in bed. Pain per below. Pt remained in bed with all needs within reach. Bed alarm activated.   Therapy Documentation Precautions:  Precautions Precautions: Fall Precaution Comments: delayed processing, Increased tone UB and LB Required Braces or Orthoses: Other Brace Other Brace: B adjustable night splints; B elbow "cosey" splints; L palm protector; R mitt Restrictions Weight Bearing Restrictions Per Provider Order: No   Pain:  Pt c/o Lt hip discomfort when returning to bed at end of session; rest and Kpad  Therapy/Group: Individual Therapy  Rich Brave 04/27/2023, 2:35 PM

## 2023-04-27 NOTE — Progress Notes (Signed)
Speech Language Pathology Daily Session Note  Patient Details  Name: Nicole [name] MRN: [id number] Date of Birth: 22-Jul-1975  Today's Date: 04/27/2023 SLP Individual Time: 1130-1200 SLP Individual Time Calculation (min): 30 min  Short Term Goals: Week 9: SLP Short Term Goal 1 (Week 9): Week 23 - Pt will solve mildly complex environmental problems with 90% accuracy given min assist. SLP Short Term Goal 2 (Week 9): Week 23 - Pt will recall basic daily information using external aids with 75% accuracy given mod assist. SLP Short Term Goal 3 (Week 9): Week 23 - Pt will demonstrate sustained attention to basic tasks for 45 minutes with supervision A multimodal cues for redirection. SLP Short Term Goal 4 (Week 9): Week 23 - Pt will utilize increased vocal intensity w/ supervision verbal cues to remain 90% intelligibile at conversation level  Skilled Therapeutic Interventions: Skilled therapy session focused on cognitive goals. SLP faciliated session by encouraging patient to write "to do" list. Patient required modA to utilize writing strategies including slowing down and writing large letters. Patient reporting she wants to go back to school, however independently aware of limitations including difficulties in reading and writing. SLP and patient discussed plan to begin targeting reading, writing and use of computer in therapy. Patient left in chair with alarm set and call bell in reach. Continue POC.   Pain None reported   Therapy/Group: Individual Therapy  Sanjna Haskew M.A., CF-SLP 04/27/2023, 7:32 AM

## 2023-04-27 NOTE — Progress Notes (Signed)
Occupational Therapy Session Note  Patient Details  Name: VERANDA VEDDER MRN: [id number] Date of Birth: [birth year]  Today's Date: 04/27/2023 OT Individual Time: 0272-5366 OT Individual Time Calculation (min): 57 min    Short Term Goals: See flowsheets  Skilled Therapeutic Interventions/Progress Updates:     Pt received sitting up in wc presenting to be in good spirits receptive to skilled OT session reporting unrated pain in feet- OT offering intermittent rest breaks, repositioning, and therapeutic support to optimize participation in therapy session. Focus this session BADL retraining to decrease overall bourdon of care, functional mobility training, and functional cognition. Pt requesting to take shower during this session. Engaged Pt in completing functional mobility to BR using RW with CGA-min A provided for balance and RW management. Pt transferred to toilet using RW and doffed pants with min A provided for balance. Provided increased time on toilet, however no void or BM at this time. Pt completed ambulatory transfer into shower using RW and grab bars with light min A and mod verbal cues for technique. Pt doffed clothing in seated position with mod A to fully bring shirt overhead and doff pants from feet. During shower, focused on functional reaching to increase B UE functional use, safety awareness, dynamic sitting balance. Pt able to utilize B UEs to complete UB bathing with mod verbal cues for attention sequencing through task- assistance provided for washing hair for energy conservation. Pt stood while holding grab bars to wash anterior peri-areas and upper B LEs and maintained balance with CGA while OT assisted with washing buttocks and posterior portion of legs. Following shower, Pt completed functional mobility back to room using RW and completed U/LB dressing completed in wc. Pt able to donn shirt with min A and pants max A with Pt participating in bringing pants to waist in standing  position using RW for balance. Pt able to groom hair (brush and apply hair products) with supervision. Donned socks and shoes max A for energy conservation.  Pt was left resting in wc with call bell in reach, seatbelt alarm on, and all needs met.    Therapy Documentation Precautions:  Precautions Precautions: Fall Precaution Comments: delayed processing, Increased tone UB and LB Required Braces or Orthoses: Other Brace Other Brace: B adjustable night splints; B elbow "cosey" splints; L palm protector; R mitt Restrictions Weight Bearing Restrictions Per Provider Order: No  Therapy/Group: Individual Therapy  ALEETA STALLING 04/27/2023, 7:52 AM

## 2023-04-28 NOTE — Progress Notes (Addendum)
PROGRESS NOTE   Subjective/Complaints:  Pt doing well, slept well, pain well controlled, LBM this morning, urinating fine, denies any other complaints or concerns today. Mentions that she occasionally has menstrual type cramps but reports she doesn't have a menses anymore-- mentions she has an IUD that's been in for 67yrs and she thinks it might be time to have it taken out.   ROS: Patient denies fever, rash, sore throat, blurred vision, dizziness, nausea, vomiting, diarrhea, cough, shortness of breath or chest pain, joint or back/neck pain, headache, or mood change. +headaches, improved spasticity    Objective:   No results found. No results for input(s): "WBC", "HGB", "HCT", "PLT" in the last 72 hours.       No results for input(s): "NA", "K", "CL", "CO2", "GLUCOSE", "BUN", "CREATININE", "CALCIUM" in the last 72 hours.        Intake/Output Summary (Last 24 hours) at 04/28/2023 0825 Last data filed at 04/28/2023 0725 Gross per 24 hour  Intake 955 ml  Output --  Net 955 ml           Physical Exam: Vital Signs Blood pressure 96/70, pulse 68, temperature 98 F (36.7 C), temperature source Oral, resp. rate 18, height 5\' 1"  (1.549 m), weight 53.6 kg, SpO2 100%.   Constitutional: No distress . Vital signs reviewed. Sitting in chair.  HEENT: NCAT, EOMI, oral membranes moist Neck: supple Cardiovascular: RRR without murmur. No JVD    Respiratory/Chest: CTA Bilaterally without wheezes or rales. Normal effort    GI/Abdomen: BS +, non-tender, non-distended Ext: no clubbing, cyanosis, or edema Psych: flat but generally pleasant and cooperative  Skin: + Scaling dried skin on bilateral heels left buttock unstageable - not examined + Left heel stage III DTI has resolved  PRIOR EXAMS: Neurologic: AAOx2,  to year but not month or date. Follows most simple commands.  + Hypophonation, speech intelligability much  improved. Fair insight. MAS 1 right elbow,MAS  tr to 1 in RIght finger and wrist flexors  MAS 1 in extensors of LE's MAS 1 L elbow , MAS 1+ in L wrist and finger flexors. Hypersensitivity to left hand improved, Left sided strength improved, decreased range of motion and pain at end range of motion in left shoulder-stable 12/13  Musculoskeletal: limited bilateral shoulder and elbow ROM due to increased tone ,right shoulder pain with PROM    Assessment/Plan: 1. Functional deficits which require 3+ hours per day of interdisciplinary therapy in a comprehensive inpatient rehab setting. Physiatrist is providing close team supervision and 24 hour management of active medical problems listed below. Physiatrist and rehab team continue to assess barriers to discharge/monitor patient progress toward functional and medical goals  Care Tool:  Bathing    Body parts bathed by patient: Face, Abdomen, Chest, Left upper leg, Right upper leg, Left arm, Front perineal area, Right arm, Right lower leg, Left lower leg   Body parts bathed by helper: Buttocks Body parts n/a: Right arm, Left arm, Front perineal area, Buttocks, Right upper leg, Left upper leg, Right lower leg, Left lower leg   Bathing assist Assist Level: Minimal Assistance - Patient > 75%     Upper Body Dressing/Undressing Upper body  dressing   What is the patient wearing?: Pull over shirt    Upper body assist Assist Level: Minimal Assistance - Patient > 75%    Lower Body Dressing/Undressing Lower body dressing      What is the patient wearing?: Incontinence brief, Pants     Lower body assist Assist for lower body dressing: Minimal Assistance - Patient > 75%     Toileting Toileting Toileting Activity did not occur (Clothing management and hygiene only): N/A (no void or bm)  Toileting assist Assist for toileting: Moderate Assistance - Patient 50 - 74%     Transfers Chair/bed transfer  Transfers assist  Chair/bed transfer  activity did not occur: Safety/medical concerns  Chair/bed transfer assist level: Minimal Assistance - Patient > 75%     Locomotion Ambulation   Ambulation assist   Ambulation activity did not occur: Safety/medical concerns  Assist level: Minimal Assistance - Patient > 75% Assistive device: Walker-rolling Max distance: 180   Walk 10 feet activity   Assist  Walk 10 feet activity did not occur: Safety/medical concerns  Assist level: Minimal Assistance - Patient > 75% Assistive device: Walker-rolling   Walk 50 feet activity   Assist Walk 50 feet with 2 turns activity did not occur: Safety/medical concerns  Assist level: Minimal Assistance - Patient > 75% Assistive device: Walker-rolling    Walk 150 feet activity   Assist Walk 150 feet activity did not occur: Safety/medical concerns  Assist level: Minimal Assistance - Patient > 75% Assistive device: Walker-rolling    Walk 10 feet on uneven surface  activity   Assist Walk 10 feet on uneven surfaces activity did not occur: Safety/medical concerns   Assist level: Minimal Assistance - Patient > 75% Assistive device: Walker-rolling   Wheelchair     Assist Is the patient using a wheelchair?: Yes Type of Wheelchair: Manual    Wheelchair assist level: Total Assistance - Patient < 25% Max wheelchair distance: 29ft    Wheelchair 50 feet with 2 turns activity    Assist    Wheelchair 50 feet with 2 turns activity did not occur: Safety/medical concerns   Assist Level: Maximal Assistance - Patient 25 - 49%   Wheelchair 150 feet activity     Assist  Wheelchair 150 feet activity did not occur: Safety/medical concerns   Assist Level: Maximal Assistance - Patient 25 - 49%   Blood pressure 96/70, pulse 68, temperature 98 F (36.7 C), temperature source Oral, resp. rate 18, height 5\' 1"  (1.549 m), weight 53.6 kg, SpO2 100%.  Medical Problem List and Plan: 1. 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 Awaiting approval of disability. SW has been working tirelessly with TOC/others on placement. Disability  remains sticking point Code status  DNR -Continue CIR therapies including PT, OT, and SLP, discussed that she continues to make progress, transferred with supervision today!!  Team conference 12/4  2.  Impaired mobility: continue Lovenox 30mg  daily, weekly creatinine ordered             -antiplatelet therapy: N/A  3. Pain:  -Knee pain: Tylenol as needed, continue voltaren gel  scheduled. XR reviewed and is stable, discussed with therapy -Right wrist swelling: XR ordered and shows no acute fracture, shows 2.24mm ulnar variance, brace removed to given patient a break from it. Voltaren gel ordered prn for discomfort, continue, CT ordered and show no acute osseus injury, continue ace wrap   -Foot pain: tramadol ordered prn, improved today, continue prn  -Headaches: continue tylenol and tramadol prn -Low back pain: continue kpad, asked [place] to please bring for her -Whole body hypersensitivity: gabapentin 100mg  TID ordered -Pain in left heel: XR obtained and negative, cam boot ordered to help offload heel with therapy, discussed that this has helped -R shoulder pain: XR neg for acute etiology on 04/06/23, voltaren gel ordered--encourage ROM as tolerated  4. History of anxiety: discussed with her mom that she feels this was a big risk factor for her accident, propanolol d/ced due to hypotension. Continue magnesium supplement  5. Neuropsych/cognition: This patient is not capable of making decisions on her own behalf  - 10/12: Still not capable of  medical decision making; BUT oriented and communicating, following directions - MUCH improved - PRN melatonin 3mg    for insomnia  6. Skin: -Buttock/sacrum--foam dressing, pressure relief, nutrition -stage 3 left heel, continue foam dressing/ pressure relief  7. Fluids/Electrolytes/Nutrition: Routine in and outs with follow-up chemistries  -Eating  well with cueing   -Dysphagia: continue D3/thins--tolerating this diet -Low protein stores: d/c prosource since patient hates these and is eating 100% of meals -Suboptimal vitamin D level: increase to 5,000 U daily. continue this dose  -History of magnesium deficiency: magnesium 500mg  stopped to support BP  -12/9 encourage appropriate PO, no labs today 8.  Cavitary right lower lobe pneumonia likely aspiration pneumonia/MRSA pneumonia.  Resolved    Latest Ref Rng & Units 04/18/2023    5:16 AM 04/04/2023    6:09 AM 02/05/2023   11:12 AM  CBC  WBC 4.0 - 10.5 K/uL 6.0  5.6  8.5   Hemoglobin 12.0 - 15.0 g/dL 43.3  29.5  18.8   Hematocrit 36.0 - 46.0 % 41.6  42.1  46.7   Platelets 150 - 400 K/uL 194  224  284      9.  History of drug abuse.  Positive cocaine on urine drug screen.  Provide counseling  10.  AKI/hypovolemia and ATN.  Resolved     Latest Ref Rng & Units 04/18/2023    5:16 AM 04/04/2023    6:09 AM 04/02/2023    3:39 PM  BMP  Glucose 70 - 99 mg/dL 79  91  85   BUN 6 - 20 mg/dL 17  20  17    Creatinine 0.44 - 1.00 mg/dL 4.16  6.06  3.01   Sodium 135 - 145 mmol/L 137  138  136   Potassium 3.5 - 5.1 mmol/L 3.8  3.9  4.1   Chloride 98 - 111 mmol/L 105  105  100   CO2 22 - 32 mmol/L 23  24  26    Calcium 8.9 - 10.3 mg/dL 9.0  9.3  8.8     11.  Mild transaminitis with rhabdomyolysis.  Both resolved  12.  Hypotension: resolved  13. HTN: resolved   14. Impaired initiation: continue amantadine 100mg  BID  15. Hyperglycemia: CBGs mildly elevated, d/ced checks. Prosource d/ced as per patient's request  16. Spasticity:   -discussed serial casting with OT and her mom but she defers given pain she had when this was attempted -baclofen and tizanidine decreased due to hypotension, spasticity  has been stable. Continue wrist, elbow brace, and PRAFOs. Ordered a night splint for LLE  -tizanidine decreased to 2mg  HS  Continue baclofen 10mg  HS  -  17.  Dyskinetic movements in head and neck - appears to be myoclonic jerks related to hypoxic brain injury - as discussed with pt mom, no specific meds for this on antispasticity meds , consioder benzo although with pt's SUD would avoid this class - EEG reviewed and was negative for seizure.  -decrease baclofen to 10mg , resolved  18. Cervical extensor weakness: decrease tizanidine to 2mg  HS, decrease baclofen to 10mg , improved, continue this dose  19. Bowel and bladder incontinence: continue bowel and bladder program. D/c miralax, decrease tizanidine to 2mg  HS, continue this dose  20. Fatigue: resolved, discussed with team may be secondary to anti-spasticity medications, B12 reviewed and is 335, in suboptimal range, will give 1,000U B12 injection 8/7, will order monthly as needed, metanx started, increase to BID, continue, decrease tizanidine to 2mg  HS, discussed with patient, decrease baclofen to 10mg  HS   21. MRSA nares positive: negative on repeat, precautions d/ced  22. Tachycardic: resolved, d/c propanolol.  Decrease tizanidine to 2mg  HS. Resolved, amantadine increased back to 100mg  BID, continue this dose, resolved  23. S/p fall: CT head reviewed and is stable  24. Bilateral foot drop: bilateral AFOs ordered, discussed that these can help her to walk better  25. Vaginal itching: completed fluconazole. Having some discharge now. See below  26. >100k E Coli UTI  -11/28 sensitive to amoxil, completed 1 week course  27. Poor sitting balance: continue OT/PT, improving  28. Headaches: topamax 81m daily prn ordered, continue  29. Drowsiness with tramadol:  d/ced  30. IUD in place? Pt states she has IUD x11yrs  and believes it's supposed to be removed soon(chart review- Dr. Seymour Bars placed Mirena IUD on 07/01/19, good for 41yrs); defer to weekday team to address this nonurgent issue, if needing it removed before 06/2024      LOS: 161 days A FACE TO [facility] EVALUATION WAS PERFORMED  [address] 04/28/2023, 8:25 AM

## 2023-04-28 NOTE — Progress Notes (Signed)
Occupational Therapy Session Note  Patient Details  Name: Nicole [name] MRN: [id number] Date of Birth: Sep 14, 1975  {CHL IP REHAB OT TIME CALCULATIONS:[id number]}   Short Term Goals: See flowsheets  Skilled Therapeutic Interventions/Progress Updates:    Patient agreeable to participate in OT session. Reports *** pain level.   Patient participated in skilled OT session focusing on ***. Therapist facilitated/assessed/developed/educated/integrated/elicited *** in order to improve/facilitate/promote    Therapy Documentation Precautions:  Precautions Precautions: Fall Precaution Comments: delayed processing, Increased tone UB and LB Required Braces or Orthoses: Other Brace Other Brace: B adjustable night splints; B elbow "cosey" splints; L palm protector; R mitt Restrictions Weight Bearing Restrictions Per Provider Order: No   Therapy/Group: Individual Therapy  Limmie Patricia, OTR/L,CBIS  Supplemental OT - [facility] and [facility] Secure Chat Preferred   04/28/2023, 10:32 PM

## 2023-04-29 DIAGNOSIS — I950 Idiopathic hypotension: Secondary | ICD-10-CM

## 2023-04-29 MED ORDER — ENSURE ENLIVE PO LIQD: 237.0000 mL | Freq: Two times a day (BID) | ORAL | Status: DC

## 2023-04-29 NOTE — Progress Notes (Signed)
Physical Therapy Session Note  Patient Details  Name: Nicole [name] MRN: [id number] Date of Birth: [birth year]  Today's Date: 04/29/2023 PT Individual Time: 0800-0840 PT Individual Time Calculation (min): 40 min   Short Term Goals: Week 15: see flow shee   Skilled Therapeutic Interventions/Progress Updates:    Chart reviewed and pt agreeable to therapy. Pt received semi-reclined in bed with no c/o pain. Also of note, pt requesting toileting at start of session. Pt required minA to EOB and modA to donn shoes. Pt amb to toilet with minA + RW and minA to removed soiled clothing. Pt required extensive time for toileting, with pt reporting she received medication yesterday for constipation, though pt reports no BM. Session focused on mobility during self care, functional transfers, and amb to promote safe home access. Pt continued session with amb to bed using minA + RW to EOB. Pt then completed full body dressing using modA for dressing and CGA for dynamnic sitting balance. Pt then requested to return to toilet. Pt amb with same level of assist to toilet. NT notified. At end of session, pt was left seated on toilet with direct hand off to NT.     Therapy Documentation Precautions:  Precautions Precautions: Fall Precaution Comments: delayed processing, Increased tone UB and LB Required Braces or Orthoses: Other Brace Other Brace: B adjustable night splints; B elbow "cosey" splints; L palm protector; R mitt Restrictions Weight Bearing Restrictions Per Provider Order: No General:       Therapy/Group: Individual Therapy  Dionne Milo, PT, DPT 04/29/2023, 8:43 AM

## 2023-04-29 NOTE — Progress Notes (Signed)
PROGRESS NOTE   Subjective/Complaints:  Pt doing well again, slept well, pain doing "fine", LBM yesterday, urinating fine, denies any other complaints or concerns today. Excited that she remembered her OBGYN's name and that her IUD was a 61yr IUD!  ROS: Patient denies fever, rash, sore throat, blurred vision, dizziness, nausea, vomiting, diarrhea, cough, shortness of breath or chest pain, joint or back/neck pain, headache, or mood change. +headaches, improved spasticity    Objective:   No results found. No results for input(s): "WBC", "HGB", "HCT", "PLT" in the last 72 hours.       No results for input(s): "NA", "K", "CL", "CO2", "GLUCOSE", "BUN", "CREATININE", "CALCIUM" in the last 72 hours.        Intake/Output Summary (Last 24 hours) at 04/29/2023 1120 Last data filed at 04/29/2023 0735 Gross per 24 hour  Intake 716 ml  Output --  Net 716 ml           Physical Exam: Vital Signs Blood pressure (!) 101/59, pulse 74, temperature 98.2 F (36.8 C), resp. rate 18, height 5\' 1"  (1.549 m), weight 53.6 kg, SpO2 97%.   Constitutional: No distress . Vital signs reviewed. Sitting in chair.  HEENT: NCAT, EOMI, oral membranes moist Neck: supple Cardiovascular: RRR without murmur. No JVD    Respiratory/Chest: CTA Bilaterally without wheezes or rales. Normal effort    GI/Abdomen: BS +, non-tender, non-distended Ext: no clubbing, cyanosis, or edema Psych: flat but generally pleasant and cooperative  Skin: + Scaling dried skin on bilateral heels left buttock unstageable - not examined + Left heel stage III DTI has resolved Neuro: memory seeming to improve lately  PRIOR EXAMS: Neurologic: AAOx2,  to year but not month or date. Follows most simple commands.  + Hypophonation, speech intelligability much improved. Fair insight. MAS 1 right elbow,MAS  tr to 1 in RIght finger and wrist flexors  MAS 1 in extensors of  LE's MAS 1 L elbow , MAS 1+ in L wrist and finger flexors. Hypersensitivity to left hand improved, Left sided strength improved, decreased range of motion and pain at end range of motion in left shoulder-stable 12/13  Musculoskeletal: limited bilateral shoulder and elbow ROM due to increased tone ,right shoulder pain with PROM    Assessment/Plan: 1. Functional deficits which require 3+ hours per day of interdisciplinary therapy in a comprehensive inpatient rehab setting. Physiatrist is providing close team supervision and 24 hour management of active medical problems listed below. Physiatrist and rehab team continue to assess barriers to discharge/monitor patient progress toward functional and medical goals  Care Tool:  Bathing    Body parts bathed by patient: Face, Abdomen, Chest, Left upper leg, Right upper leg, Left arm, Front perineal area, Right arm, Right lower leg, Left lower leg   Body parts bathed by helper: Buttocks Body parts n/a: Right arm, Left arm, Front perineal area, Buttocks, Right upper leg, Left upper leg, Right lower leg, Left lower leg   Bathing assist Assist Level: Minimal Assistance - Patient > 75%     Upper Body Dressing/Undressing Upper body dressing   What is the patient wearing?: Pull over shirt    Upper body assist Assist Level: Minimal  Assistance - Patient > 75%    Lower Body Dressing/Undressing Lower body dressing      What is the patient wearing?: Incontinence brief, Pants     Lower body assist Assist for lower body dressing: Minimal Assistance - Patient > 75%     Toileting Toileting Toileting Activity did not occur (Clothing management and hygiene only): N/A (no void or bm)  Toileting assist Assist for toileting: Moderate Assistance - Patient 50 - 74%     Transfers Chair/bed transfer  Transfers assist  Chair/bed transfer activity did not occur: Safety/medical concerns  Chair/bed transfer assist level: Minimal Assistance - Patient >  75%     Locomotion Ambulation   Ambulation assist   Ambulation activity did not occur: Safety/medical concerns  Assist level: Minimal Assistance - Patient > 75% Assistive device: Walker-rolling Max distance: 180   Walk 10 feet activity   Assist  Walk 10 feet activity did not occur: Safety/medical concerns  Assist level: Minimal Assistance - Patient > 75% Assistive device: Walker-rolling   Walk 50 feet activity   Assist Walk 50 feet with 2 turns activity did not occur: Safety/medical concerns  Assist level: Minimal Assistance - Patient > 75% Assistive device: Walker-rolling    Walk 150 feet activity   Assist Walk 150 feet activity did not occur: Safety/medical concerns  Assist level: Minimal Assistance - Patient > 75% Assistive device: Walker-rolling    Walk 10 feet on uneven surface  activity   Assist Walk 10 feet on uneven surfaces activity did not occur: Safety/medical concerns   Assist level: Minimal Assistance - Patient > 75% Assistive device: Walker-rolling   Wheelchair     Assist Is the patient using a wheelchair?: Yes Type of Wheelchair: Manual    Wheelchair assist level: Total Assistance - Patient < 25% Max wheelchair distance: 22ft    Wheelchair 50 feet with 2 turns activity    Assist    Wheelchair 50 feet with 2 turns activity did not occur: Safety/medical concerns   Assist Level: Maximal Assistance - Patient 25 - 49%   Wheelchair 150 feet activity     Assist  Wheelchair 150 feet activity did not occur: Safety/medical concerns   Assist Level: Maximal Assistance - Patient 25 - 49%   Blood pressure (!) 101/59, pulse 74, temperature 98.2 F (36.8 C), resp. rate 18, height 5\' 1"  (1.549 m), weight 53.6 kg, SpO2 97%.  Medical Problem List and Plan: 1. 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 Awaiting approval of disability. SW has been working tirelessly with TOC/others on placement. Disability  remains sticking point Code status  DNR -Continue CIR therapies including PT, OT, and SLP, discussed that she continues to make progress, transferred with supervision today!!  Team conference 12/4  2.  Impaired mobility: continue Lovenox 30mg  daily, weekly creatinine ordered             -antiplatelet therapy: N/A  3. Pain:  -Knee pain: Tylenol as needed, continue voltaren gel scheduled. XR reviewed and is stable, discussed with therapy -Right wrist swelling: XR ordered and shows no acute fracture, shows 2.5mm ulnar variance, brace removed to given patient a break from it. Voltaren gel  ordered prn for discomfort, continue, CT ordered and show no acute osseus injury, continue ace wrap   -Foot pain: tramadol ordered prn, improved today, continue prn  -Headaches: continue tylenol and tramadol prn -Low back pain: continue kpad, asked [place] to please bring for her -Whole body hypersensitivity: gabapentin 100mg  TID ordered -Pain in left heel: XR obtained and negative, cam boot ordered to help offload heel with therapy, discussed that this has helped -R shoulder pain: XR neg for acute etiology on 04/06/23, voltaren gel ordered--encourage ROM as tolerated  4. History of anxiety: discussed with her mom that she feels this was a big risk factor for her accident, propanolol d/ced due to hypotension. Continue magnesium supplement  5. Neuropsych/cognition: This patient is not capable of making decisions on her own behalf  - 10/12: Still not capable of medical decision making; BUT oriented and communicating, following directions - MUCH improved - PRN melatonin 3mg    for insomnia  6.  Skin: -Buttock/sacrum--foam dressing, pressure relief, nutrition -stage 3 left heel, continue foam dressing/ pressure relief  7. Fluids/Electrolytes/Nutrition: Routine in and outs with follow-up chemistries  -Eating  well with cueing   -Dysphagia: continue D3/thins--tolerating this diet -Low protein stores: d/c prosource since patient hates these and is eating 100% of meals -Suboptimal vitamin D level: increase to 5,000 U daily. continue this dose  -History of magnesium deficiency: magnesium 500mg  stopped to support BP  -12/9 encourage appropriate PO, no labs today 8.  Cavitary right lower lobe pneumonia likely aspiration pneumonia/MRSA pneumonia.  Resolved    Latest Ref Rng & Units 04/18/2023    5:16 AM 04/04/2023    6:09 AM 02/05/2023   11:12 AM  CBC  WBC 4.0 - 10.5 K/uL 6.0  5.6  8.5   Hemoglobin 12.0 - 15.0 g/dL 16.1  09.6  04.5   Hematocrit 36.0 - 46.0 % 41.6  42.1  46.7   Platelets 150 - 400 K/uL 194  224  284      9.  History of drug abuse.  Positive cocaine on urine drug screen.  Provide counseling  10.  AKI/hypovolemia and ATN.  Resolved     Latest Ref Rng & Units 04/18/2023    5:16 AM 04/04/2023    6:09 AM 04/02/2023    3:39 PM  BMP  Glucose 70 - 99 mg/dL 79  91  85   BUN 6 - 20 mg/dL 17  20  17    Creatinine 0.44 - 1.00 mg/dL 4.09  8.11  9.14   Sodium 135 - 145 mmol/L 137  138  136   Potassium 3.5 - 5.1 mmol/L 3.8  3.9  4.1   Chloride 98 - 111 mmol/L 105  105  100   CO2 22 - 32 mmol/L 23  24  26    Calcium 8.9 - 10.3 mg/dL 9.0  9.3  8.8     11.  Mild transaminitis with rhabdomyolysis.  Both resolved  12.  Hypotension:  -04/29/23 BPs still soft end of normal but seems to be her baseline; monitor Vitals:   04/24/23 1921 04/25/23 0411 04/25/23 1237 04/25/23 2036  BP: 102/69 103/66 105/76 105/66   04/26/23 0337 04/26/23 1253 04/26/23 1926 04/27/23 0420  BP: 97/71 111/78 102/65 118/66   04/28/23 0605 04/28/23 1322 04/28/23 2017 04/29/23 0416  BP: 96/70 95/65  97/67 (!) 101/59     13. HTN: resolved   14. Impaired initiation: continue amantadine 100mg  BID  15. Hyperglycemia: CBGs mildly elevated, d/ced checks. Prosource d/ced as per  patient's request  16. Spasticity:  -discussed serial casting with OT and her mom but she defers given pain she had when this was attempted -baclofen and tizanidine decreased due to hypotension, spasticity has been stable. Continue wrist, elbow brace, and PRAFOs. Ordered a night splint for LLE  -tizanidine decreased to 2mg  HS  Continue baclofen 10mg  HS  -  17.  Dyskinetic movements in head and neck - appears to be myoclonic jerks related to hypoxic brain injury - as discussed with pt mom, no specific meds for this on antispasticity meds , consioder benzo although with pt's SUD would avoid this class - EEG reviewed and was negative for seizure.  -decrease baclofen to 10mg , resolved   18. Cervical extensor weakness: decrease tizanidine to 2mg  HS, decrease baclofen to 5mg  QHS, improved, continue this dose  19. Bowel and bladder incontinence: continue bowel and bladder program. D/c miralax, decrease tizanidine to 2mg  HS, continue this dose  20. Fatigue: resolved, discussed with team may be secondary to anti-spasticity medications, B12 reviewed and is 335, in suboptimal range, will give 1,000U B12 injection 8/7, will order monthly as needed, metanx started, increase to BID, continue, decrease tizanidine to 2mg  HS, discussed with patient, decrease baclofen to 10mg  HS   21. MRSA nares positive: negative on repeat, precautions d/ced  22. Tachycardic: resolved, d/c propanolol.  Decrease tizanidine to 2mg  HS. Resolved, amantadine increased back to 100mg  BID, continue this dose, resolved  23. S/p fall: CT head reviewed and is stable  24. Bilateral foot drop: bilateral AFOs ordered, discussed that these can help her to walk better  25. Vaginal itching: completed fluconazole. Having some discharge now. See below  26.  >100k E Coli UTI  -11/28 sensitive to amoxil, completed 1 week course  27. Poor sitting balance: continue OT/PT, improving  28. Headaches: topamax 35m daily prn ordered, continue  29. Drowsiness with tramadol: d/ced  30. IUD in place? Pt states she has IUD x56yrs  and believes it's supposed to be removed soon(chart review- Dr. Seymour Bars placed Mirena IUD on 07/01/19, good for 13yrs); defer to weekday team to address this nonurgent issue, if needing it removed before 06/2024-- pt made aware of "expiration" date.       LOS: 162 days A FACE TO FACE EVALUATION WAS PERFORMED  [address] 04/29/2023, 11:20 AM

## 2023-04-29 NOTE — Progress Notes (Signed)
Occupational Therapy Session Note  Patient Details  Name: KANESHA CONRADY MRN: [id number] Date of Birth: 01-07-1976  {CHL IP REHAB OT TIME CALCULATIONS:[id number]}   Short Term Goals: See flowsheets  Skilled Therapeutic Interventions/Progress Updates:    Patient agreeable to participate in OT session. Reports *** pain level.   Patient participated in skilled OT session focusing on ***. Therapist facilitated/assessed/developed/educated/integrated/elicited *** in order to improve/facilitate/promote    Therapy Documentation Precautions:  Precautions Precautions: Fall Precaution Comments: delayed processing, Increased tone UB and LB Required Braces or Orthoses: Other Brace Other Brace: B adjustable night splints; B elbow "cosey" splints; L palm protector; R mitt Restrictions Weight Bearing Restrictions Per Provider Order: No   Therapy/Group: Individual Therapy  Limmie Patricia, OTR/L,CBIS  Supplemental OT - [facility] and [facility] Secure Chat Preferred   04/29/2023, 7:01 PM

## 2023-04-30 NOTE — Progress Notes (Signed)
Physical Therapy Session Note  Patient Details  Name: Nicole [name] MRN: [id number] Date of Birth: [birth year]  Today's Date: 04/30/2023 PT Individual Time: 4098-1191 PT Individual Time Calculation (min): 39 min   Short Term Goals: Week 1:  PT Short Term Goal 1 (Week 1): Pt will improve bed mobility to minA consistently PT Short Term Goal 1 - Progress (Week 1): Not met PT Short Term Goal 2 (Week 1): Pt will complete functional outcome measure to assess balance and falls risk PT Short Term Goal 2 - Progress (Week 1): Not met PT Short Term Goal 3 (Week 1): Pt will ambulate 173ft with CGA and LRAD PT Short Term Goal 3 - Progress (Week 1): Not met  Skilled Therapeutic Interventions/Progress Updates:      Pt sitting in wheelchair and ready for therapy. No complaints of pain.   Transported at wheelchair level to day room rehab gym. Sit<>stand to RW with CGA. Gait training >278ft  + ~23ft with CGA and RW in rehab spaces and hallways. Cues for slowing down her pace and keeping body within walker frame. Does better when takes her time and tends to rush herself.   Pt requesting for coffee - prepared per hospital policy. Pt appreciative.   Pt instructed in standing on blue compliant surface with CGA/minA for balance while playing table-top Tik-Tak-Toe. Patient requiring mod to max instruction and awareness while playing, poor recall of if she was playing as "circle" or "x" during the game.   Returned to her room and patient left sitting upright in wheelchair with chair pad alarm on, call bell and personal items within reach.   Therapy Documentation Precautions:  Precautions Precautions: Fall Precaution Comments: delayed processing, Increased tone UB and LB Required Braces or Orthoses: Other Brace Other Brace: B adjustable night splints; B elbow "cosey" splints; L palm protector; R mitt Restrictions Weight Bearing Restrictions Per Provider Order: No General:     Therapy/Group:  Individual Therapy  Orrin Brigham 04/30/2023, 7:39 AM

## 2023-04-30 NOTE — Progress Notes (Signed)
PROGRESS NOTE   Subjective/Complaints: No new complaints this morning Tolerated therapy well this morning- ambulated >450 feet! No issues overnight  ROS: Patient denies fever, rash, sore throat, blurred vision, dizziness, nausea, vomiting, diarrhea, cough, shortness of breath or chest pain, joint or back/neck pain, headache, or mood change. +headaches, improved spasticity    Objective:   No results found. No results for input(s): "WBC", "HGB", "HCT", "PLT" in the last 72 hours.       No results for input(s): "NA", "K", "CL", "CO2", "GLUCOSE", "BUN", "CREATININE", "CALCIUM" in the last 72 hours.        Intake/Output Summary (Last 24 hours) at 04/30/2023 1102 Last data filed at 04/30/2023 0810 Gross per 24 hour  Intake 956 ml  Output --  Net 956 ml           Physical Exam: Vital Signs Blood pressure 96/72, pulse 73, temperature 98.1 F (36.7 C), resp. rate 18, height 5\' 1"  (1.549 m), weight 52.5 kg, SpO2 98%.   Constitutional: No distress . Vital signs reviewed. Sitting in chair.  HEENT: NCAT, EOMI, oral membranes moist Neck: supple Cardiovascular: RRR without murmur. No JVD    Respiratory/Chest: CTA Bilaterally without wheezes or rales. Normal effort    GI/Abdomen: BS +, non-tender, non-distended Ext: no clubbing, cyanosis, or edema Psych: flat but generally pleasant and cooperative  Skin: + Scaling dried skin on bilateral heels left buttock unstageable - not examined + Left heel stage III DTI has resolved Neuro: memory seeming to improve lately Neurologic: AAOx2,  to year but not month or date. Follows most simple commands.  + Hypophonation, speech intelligability much improved. Fair insight. MAS 1 right elbow,MAS  tr to 1 in RIght finger and wrist flexors  MAS 1 in extensors of LE's MAS 1 L elbow , MAS 1+ in L wrist and finger flexors. Hypersensitivity to left hand improved, Left sided strength  improved, decreased range of motion and pain at end range of motion in left shoulder- stable 12/16  Musculoskeletal: limited bilateral shoulder and elbow ROM due to increased tone ,right shoulder pain with PROM    Assessment/Plan: 1. Functional deficits which require 3+ hours per day of interdisciplinary therapy in a comprehensive inpatient rehab setting. Physiatrist is providing close team supervision and 24 hour management of active medical problems listed below. Physiatrist and rehab team continue to assess barriers to discharge/monitor patient progress toward functional and medical goals  Care Tool:  Bathing    Body parts bathed by patient: Face, Abdomen, Chest, Left upper leg, Right upper leg, Left arm, Front perineal area, Right arm, Right lower leg, Left lower leg   Body parts bathed by helper: Buttocks Body parts n/a: Right arm, Left arm, Front perineal area, Buttocks, Right upper leg, Left upper leg, Right lower leg, Left lower leg   Bathing assist Assist Level: Minimal Assistance - Patient > 75%     Upper Body Dressing/Undressing Upper body dressing   What is the patient wearing?: Pull over shirt    Upper body assist Assist Level: Minimal Assistance - Patient > 75%    Lower Body Dressing/Undressing Lower body dressing      What is the patient  wearing?: Incontinence brief, Pants     Lower body assist Assist for lower body dressing: Minimal Assistance - Patient > 75%     Toileting Toileting Toileting Activity did not occur (Clothing management and hygiene only): N/A (no void or bm)  Toileting assist Assist for toileting: Moderate Assistance - Patient 50 - 74%     Transfers Chair/bed transfer  Transfers assist  Chair/bed transfer activity did not occur: Safety/medical concerns  Chair/bed transfer assist level: Minimal Assistance - Patient > 75%     Locomotion Ambulation   Ambulation assist   Ambulation activity did not occur: Safety/medical  concerns  Assist level: Minimal Assistance - Patient > 75% Assistive device: Walker-rolling Max distance: 180   Walk 10 feet activity   Assist  Walk 10 feet activity did not occur: Safety/medical concerns  Assist level: Minimal Assistance - Patient > 75% Assistive device: Walker-rolling   Walk 50 feet activity   Assist Walk 50 feet with 2 turns activity did not occur: Safety/medical concerns  Assist level: Minimal Assistance - Patient > 75% Assistive device: Walker-rolling    Walk 150 feet activity   Assist Walk 150 feet activity did not occur: Safety/medical concerns  Assist level: Minimal Assistance - Patient > 75% Assistive device: Walker-rolling    Walk 10 feet on uneven surface  activity   Assist Walk 10 feet on uneven surfaces activity did not occur: Safety/medical concerns   Assist level: Minimal Assistance - Patient > 75% Assistive device: Walker-rolling   Wheelchair     Assist Is the patient using a wheelchair?: Yes Type of Wheelchair: Manual    Wheelchair assist level: Total Assistance - Patient < 25% Max wheelchair distance: 54ft    Wheelchair 50 feet with 2 turns activity    Assist    Wheelchair 50 feet with 2 turns activity did not occur: Safety/medical concerns   Assist Level: Maximal Assistance - Patient 25 - 49%   Wheelchair 150 feet activity     Assist  Wheelchair 150 feet activity did not occur: Safety/medical concerns   Assist Level: Maximal Assistance - Patient 25 - 49%   Blood pressure 96/72, pulse 73, temperature 98.1 F (36.7 C), resp. rate 18, height 5\' 1"  (1.549 m), weight 52.5 kg, SpO2 98%.  Medical Problem List and Plan: 1. 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 Awaiting approval of disability. SW has been working tirelessly with TOC/others on placement. Disability  remains sticking point Code status  DNR -Continue CIR, making great progress!  Team conference 12/4  2.  Impaired mobility: continue Lovenox 30mg  daily, weekly creatinine ordered             -antiplatelet therapy: N/A  3. Pain:  -Knee pain: Tylenol as needed, continue voltaren gel scheduled. XR reviewed and is stable, discussed with therapy -Right wrist swelling: XR ordered and shows no acute fracture, shows 2.70mm ulnar variance, brace removed to given patient a break from it. Voltaren gel ordered prn for discomfort, continue, CT ordered and show no acute osseus injury, continue ace wrap   -Foot pain: tramadol ordered prn, improved today, continue prn  -Headaches:continue tylenol and tramadol prn -Low back pain: continue kpad,  asked [place] to please bring for her -Whole body hypersensitivity: gabapentin 100mg  TID ordered -Pain in left heel: XR obtained and negative, cam boot ordered to help offload heel with therapy, discussed that this has helped -R shoulder pain: XR neg for acute etiology on 04/06/23, voltaren gel ordered--encourage ROM as tolerated  4. History of anxiety: discussed with her mom that she feels this was a big risk factor for her accident, propanolol d/ced due to hypotension. Continue magnesium supplement  5. Neuropsych/cognition: This patient is not capable of making decisions on her own behalf  - 10/12: Still not capable of medical decision making; BUT oriented and communicating, following directions - MUCH improved - PRN melatonin 3mg    for insomnia  6. Skin: -Buttock/sacrum--foam dressing, pressure relief, nutrition -stage 3 left heel, continue foam dressing/ pressure relief  7. Fluids/Electrolytes/Nutrition: Routine in and outs with follow-up chemistries  -Eating   well with cueing   -Dysphagia: continue D3/thins--tolerating this diet -Low protein stores: d/c prosource since patient hates these and is eating 100% of meals -Suboptimal vitamin D level: increase to 5,000 U daily. continue this dose  -History of magnesium deficiency: magnesium 500mg  stopped to support BP  -12/9 encourage appropriate PO, no labs today 8.  Cavitary right lower lobe pneumonia likely aspiration pneumonia/MRSA pneumonia.  Resolved    Latest Ref Rng & Units 04/18/2023    5:16 AM 04/04/2023    6:09 AM 02/05/2023   11:12 AM  CBC  WBC 4.0 - 10.5 K/uL 6.0  5.6  8.5   Hemoglobin 12.0 - 15.0 g/dL 40.1  02.7  25.3   Hematocrit 36.0 - 46.0 % 41.6  42.1  46.7   Platelets 150 - 400 K/uL 194  224  284      9.  History of drug abuse.  Positive cocaine on urine drug screen.  Provide counseling  10.  AKI/hypovolemia and ATN.  Resolved     Latest Ref Rng & Units 04/18/2023    5:16 AM 04/04/2023    6:09 AM 04/02/2023    3:39 PM  BMP  Glucose 70 - 99 mg/dL 79  91  85   BUN 6 - 20 mg/dL 17  20  17    Creatinine 0.44 - 1.00 mg/dL 6.64  4.03  4.74   Sodium 135 - 145 mmol/L 137  138  136   Potassium 3.5 - 5.1 mmol/L 3.8  3.9  4.1   Chloride 98 - 111 mmol/L 105  105  100   CO2 22 - 32 mmol/L 23  24  26    Calcium 8.9 - 10.3 mg/dL 9.0  9.3  8.8     11.  Mild transaminitis with rhabdomyolysis.  Both resolved  12.  Hypotension:  -04/29/23 BPs still soft end of normal but seems to be her baseline; monitor Vitals:   04/25/23 2036 04/26/23 0337 04/26/23 1253 04/26/23 1926  BP: 105/66 97/71 111/78 102/65   04/27/23 0420 04/28/23 0605 04/28/23 1322 04/28/23 2017  BP: 118/66 96/70 95/65  97/67   04/29/23 0416 04/29/23 1342 04/29/23 1947 04/30/23 0312  BP: (!) 101/59 102/67 95/66 96/72      13. HTN: resolved   14. Impaired initiation: continue amantadine 100mg  BID  15. Hyperglycemia: CBGs mildly elevated, d/ced checks. Prosource d/ced as per patient's request  16. Spasticity:   -discussed serial casting with OT and her mom but she defers given pain she had when this was attempted -baclofen and tizanidine decreased due to hypotension, spasticity has been stable. Continue wrist, elbow  brace, and PRAFOs. Ordered a night splint for LLE  -tizanidine decreased to 2mg  HS  Continue baclofen 10mg  HS  -  17.  Dyskinetic movements in head and neck - appears to be myoclonic jerks related to hypoxic brain injury - as discussed with pt mom, no specific meds for this on antispasticity meds , consioder benzo although with pt's SUD would avoid this class - EEG reviewed and was negative for seizure.  -decrease baclofen to 10mg , resolved   18. Cervical extensor weakness: decrease tizanidine to 2mg  HS, decrease baclofen to 5mg  QHS, improved, continue this dose  19. Bowel and bladder incontinence: continue bowel and bladder program. D/c miralax, decrease tizanidine to 2mg  HS, continue this dose  20. Fatigue: resolved, discussed with team may be secondary to anti-spasticity medications, B12 reviewed and is 335, in suboptimal range, will give 1,000U B12 injection 8/7, will order monthly as needed, metanx started, increase to BID, continue, decrease tizanidine to 2mg  HS, discussed with patient, decrease baclofen 5mg  HS   21. MRSA nares positive: negative on repeat, precautions d/ced  22. Tachycardic: resolved, d/c propanolol.  Decrease tizanidine to 2mg  HS. Resolved, amantadine increased back to 100mg  BID, continue this dose, resolved  23. S/p fall: CT head reviewed and is stable  24. Bilateral foot drop: bilateral AFOs ordered, discussed that these can help her to walk better  25. Vaginal itching: completed fluconazole. Having some discharge now. See below  26. >100k E Coli UTI  -11/28 sensitive to amoxil, completed 1 week course  27. Poor sitting balance: continue OT/PT, improving  28. Headaches: topamax 88m daily prn ordered, continue  29. Drowsiness with tramadol:  d/ced  30. IUD in place? Pt states she has IUD x65yrs  and believes it's supposed to be removed soon(chart review- Dr. Seymour Bars placed Mirena IUD on 07/01/19, good for 53yrs); defer to weekday team to address this nonurgent issue, please schedule outpatient follow-up with OB/GYN      LOS: 163 days A FACE TO FACE EVALUATION WAS PERFORMED  Nazaret Chea P Chynah Orihuela 04/30/2023, 11:02 AM

## 2023-04-30 NOTE — Progress Notes (Signed)
Occupational Therapy Session Note  Patient Details  Name: Nicole [name] MRN: [id number] Date of Birth: [birth year]  Today's Date: 04/30/2023 OT Individual Time: 1005-1103 OT Individual Time Calculation (min): 58 min    Short Term Goals: Week 2:  OT Short Term Goal 1 (Week 2): Week 21: Pt will ambulate to BR with RW with min A with min cues for safety OT Short Term Goal 1 - Progress (Week 2): Met OT Short Term Goal 2 (Week 2): Week 21 Patient will demonstrate safe sitting balance while completing familiar BADL task with min cueing- progressing OT Short Term Goal 2 - Progress (Week 2): Other (comment) OT Short Term Goal 3 (Week 2): Week 21Pt will perform a groom task standing at the sink with min A for 1 min with min cues OT Short Term Goal 3 - Progress (Week 2): Met OT Short Term Goal 4 (Week 2): WEek 21 Pt will bend down to retrieve items in drawer in prep for ADL from standing position with min A with RW OT Short Term Goal 4 - Progress (Week 2): Other (comment) OT Short Term Goal 5 (Week 2): xxx OT Short Term Goal 5 - Progress (Week 2): Other (comment)  Skilled Therapeutic Interventions/Progress Updates:    Patient received seated in wheelchair.  Patient requesting to shower as she spilled syrup on herself this morning at breakfast.  Patient ambulated to bathroom with RW and contact guard assist.  Patient indicated need to void and walked to toilet.  Patient needing physical assistance to stand and balance while managing brief and pants.  Patient able to complete hygiene.  Walked then to shower seat and requested needed items to shower.  Patient continues with perseverative behavior - but is easily redirected.  Patient with slight improvement to hip range of motion allowing her to more safely cross legs to attempt to dress feet.  Discussed with patient new goal of reducing time wearing an incontinence brief.  Patient eager for this goal, but unsure of process.  Discusses a timed toileting  schedule during the day as she cannot always tell when she needs to void.  Will discuss with team.  Patient left up in wheelchair at end of session at sink - finishing oral care.  Patient's nurse aware and was directly following up to ensure her safety.    Therapy Documentation Precautions:  Precautions Precautions: Fall Precaution Comments: decreased stand balance- reliant on UE, Pain in LLE limits mobility at times Required Braces or Orthoses: Other Brace Other Brace: B adjustable night splints; B elbow "cosey" splints; L palm protector; R mitt Restrictions Weight Bearing Restrictions Per Provider Order: No   Pain:  Denies pain at start of session    Therapy/Group: Individual Therapy  [name] 04/30/2023, 12:49 PM

## 2023-04-30 NOTE — Progress Notes (Signed)
Patient ID: Nicole [name], female   DOB: 1975-10-25, 47 y.o.   MRN: [id number]  Continue to await if management will provide LOG, no word as of yet. [name] disability is still pending. Updated FL2 completed and sent to facilities. Pt continues to make progress but will still need care at discharge due to cognition and is min assist level. Working on her bowel and bladder issues and continecy

## 2023-04-30 NOTE — Progress Notes (Signed)
Physical Therapy Session Note  Patient Details  Name: Nicole [name] MRN: [id number] Date of Birth: [birth year]  Today's Date: 04/30/2023 PT Individual Time: 1120-1155 PT Individual Time Calculation (min): 35 min   Short Term Goals: Week 15-See flowsheet  Skilled Therapeutic Interventions/Progress Updates:     Pt seated in WC upon arrival. Pt agreeable to therapy. Pt denies any pain at rest, reports 6/10 back pain. Pt requesting tylenol, notified nursing.   Pt ambualted with RW and CGA to bathroom, pt demos good technique of standing close to frame of RW. pt performed ambulatory transfer and donned and doffed pants with RW and CGA, pt performed ambulatory transfer to toielt with use of grab bars.   Pt donned jacket while standing with min A for standing balance for placing R UE with therapist assisting to donn jacket.   Pt ambulated from room to main gym, main gym to room with RW and CGA, verbal and tactile cues provided for forward gaze and upright posture, pt has difficulty sustaining. Intermittent verbal cues provided for slowing down, and for correction of lateral deviation to R.   Pt performed toe taps on 2 inch step with no AD and L HHA, with mod A x5 B with verbal/visual cues provided for upright posture (looking at self in mirror) and slowing down-gentle taps, especially with R taps.   Pt seated in WC at end of session with all needs within reach and seatbelt alarm on.   Therapy Documentation Precautions:  Precautions Precautions: Fall Precaution Comments: delayed processing, Increased tone UB and LB Required Braces or Orthoses: Other Brace Other Brace: B adjustable night splints; B elbow "cosey" splints; L palm protector; R mitt Restrictions Weight Bearing Restrictions Per Provider Order: No  Therapy/Group: Individual Therapy  [facility] [place], [place], DPT  04/30/2023, 7:44 AM

## 2023-05-01 ENCOUNTER — Inpatient Hospital Stay (HOSPITAL_COMMUNITY): Payer: MEDICAID

## 2023-05-01 NOTE — Progress Notes (Signed)
Occupational Therapy Session Note  Patient Details  Name: Nicole [name] MRN: [id number] Date of Birth: 21-Apr-1976  Today's Date: 05/01/2023 OT Individual Time: 4132-4401 OT Individual Time Calculation (min): 28 min    Short Term Goals: Week 2:  OT Short Term Goal 1 (Week 2): Week 21: Pt will ambulate to BR with RW with min A with min cues for safety OT Short Term Goal 1 - Progress (Week 2): Met OT Short Term Goal 2 (Week 2): Week 21 Patient will demonstrate safe sitting balance while completing familiar BADL task with min cueing- progressing OT Short Term Goal 2 - Progress (Week 2): Other (comment) OT Short Term Goal 3 (Week 2): Week 21Pt will perform a groom task standing at the sink with min A for 1 min with min cues OT Short Term Goal 3 - Progress (Week 2): Met OT Short Term Goal 4 (Week 2): WEek 21 Pt will bend down to retrieve items in drawer in prep for ADL from standing position with min A with RW OT Short Term Goal 4 - Progress (Week 2): Other (comment) OT Short Term Goal 5 (Week 2): xxx OT Short Term Goal 5 - Progress (Week 2): Other (comment)  Skilled Therapeutic Interventions/Progress Updates:    1:1 Pt received in w/c. Performed timed toileting - offered; pt ambulated with RW to the bathroom with contact guard. PT was able to unbutton, zip and manage clothing both up and down. No void. Pt then ambualted to the dayroom from her room with contact guard with youth RW. Pt at table did 9 hole peG test with both hands- left - 45 seconds and right in 26 seconds!!!  Worked with soft tan putty finding pony beads with focus on using left hand at nondominant and then moved to dominant level with focus on pincher grasp. Also worked on extension of hand with rolling putty into a ball. Transferred back into w/c with min guard and left with next therapist.   Therapy Documentation Precautions:  Precautions Precautions: Fall Precaution Comments: decreased stand balance- reliant on UE, Pain in  LLE limits mobility at times Required Braces or Orthoses: Other Brace Other Brace: B adjustable night splints; B elbow "cosey" splints; L palm protector; R mitt Restrictions Weight Bearing Restrictions Per Provider Order: No  Pain: No c/o pain in session    Therapy/Group: Individual Therapy  Roney Mans [facility] 05/01/2023, 11:23 AM

## 2023-05-01 NOTE — Progress Notes (Signed)
Physical Therapy Session Note  Patient Details  Name: Nicole [name] MRN: [id number] Date of Birth: 02/09/1976  Today's Date: 05/01/2023 PT Individual Time: 0800-0915 PT Individual Time Calculation (min): 75 min   Short Term Goals: Week 1:  PT Short Term Goal 1 (Week 1): Pt will improve bed mobility to minA consistently PT Short Term Goal 1 - Progress (Week 1): Not met PT Short Term Goal 2 (Week 1): Pt will complete functional outcome measure to assess balance and falls risk PT Short Term Goal 2 - Progress (Week 1): Not met PT Short Term Goal 3 (Week 1): Pt will ambulate 161ft with CGA and LRAD PT Short Term Goal 3 - Progress (Week 1): Not met  Skilled Therapeutic Interventions/Progress Updates:      Pt lying in bed eating breakfast - NT present who assisted with setup. Patient awkwardly positioned in bed, not optimal for feeding. Assisted patient to EOB with minA for forward scooting to achieve feet flat. Patient finished her breakfast sitting EOB with supervision for sitting balance. Cues for increasing anterior pelvic tilt, upright posture, and pacing herself as she eats (takes quick and large bites). Discussed general [place] planning as she finished her meal - patient reports she's looking into going to a homeless shelter [place] - discussed barriers and safety concerns to this, reviewed level of function needed to live independently. Pt in agreement of concerns.   Pt requesting to shower. Sit<>Stand to RW with CGA. Ambulates with minA and RW into the bathroom. Assisted onto the toilet due to urge to urinate but unable to void. Ambulatory transfer to the shower chair with CGA and RW. Pt able to doff clothes with minA. She showered herself while sitting with assist for managing shower wand. Patient has difficulty organizing tasks needed to shower and needed min cues for this. CGA while she reached for her lower legs and feet to wash. Assist needed for washing her back and buttock.   Ambulatory  transfer with minA and RW to wheelchair. Completed UB/LB dressing with increased assist for time management. Pt able to fold the towel to wrap her head/hair without assist.   Completed self care tasks while sitting in wheelchair sinkside - oral care, brushing her hair, applying lotion, etc. All completed with setupA.   LPN present for morning medications during treatment.   Sit<>stand to RW from w/c with CGA. Ambulated with RW and CGA from her room to day room rehab gym, ~159ft. Cues for upright posture and forward gaze. Gait training back to her room - tried to use with no AD or UE support - needing modA overall for balance and support. Gait antalgic with LLE hip drop and limited stance time on L - pt reports sensation of feeling that her "hip will dislocate" when not using the RW.   Pt ended session seated in w/c with chair pad alarm on. All needs met at end.    Therapy Documentation Precautions:  Precautions Precautions: Fall Precaution Comments: decreased stand balance- reliant on UE, Pain in LLE limits mobility at times Required Braces or Orthoses: Other Brace Other Brace: B adjustable night splints; B elbow "cosey" splints; L palm protector; R mitt Restrictions Weight Bearing Restrictions Per Provider Order: No General:     Therapy/Group: Individual Therapy  Orrin Brigham 05/01/2023, 7:49 AM

## 2023-05-01 NOTE — Progress Notes (Signed)
Speech Language Pathology Daily Session Note  Patient Details  Name: KENNEISHA STANBACK MRN: [id number] Date of Birth: 1975-06-13  Today's Date: 05/01/2023 SLP Individual Time: 1445-1530 SLP Individual Time Calculation (min): 45 min  Short Term Goals: Week 9: SLP Short Term Goal 1 (Week 9): Week 23 - Pt will solve mildly complex environmental problems with 90% accuracy given min assist. SLP Short Term Goal 2 (Week 9): Week 23 - Pt will recall basic daily information using external aids with 75% accuracy given mod assist. SLP Short Term Goal 3 (Week 9): Week 23 - Pt will demonstrate sustained attention to basic tasks for 45 minutes with supervision A multimodal cues for redirection. SLP Short Term Goal 4 (Week 9): Week 23 - Pt will utilize increased vocal intensity w/ supervision verbal cues to remain 90% intelligibile at conversation level  Skilled Therapeutic Interventions: SLP conducted skilled therapy session targeting cognitive retraining goals. Patient required mod cues to reposition into upright in the bed and eventual max assist to move hips up in the bed for optimal comfort. SLP and patient engaged in mildly complex problem solving task with patient requiring mod fading to eventual supervision-min multimodal cues to achieve 100% accuracy on 5/5 problems. Targeted functional handwriting with patient continuing to slowly improve re: impulsivity and handwriting perseveration to improve legibility. Patient benefited from verbal cues, task breakdown, and intermittent hand over hand assist to legibly write target phrases/sentences. When patient switched to writing in all capital letters, patient's legibility improved to 100% with supervision. Added to patient's memory book with patient oriented to date independently and recalling daily events from previous therapy sessions with overall mod assist. Patient was left in lowered bed with call bell in reach and bed alarm set. SLP will continue to target goals  per plan of care.      Pain Pain Assessment Pain Scale: 0-10 Pain Score: 0-No pain  Therapy/Group: Individual Therapy  [name], M.A., CCC-SLP  [name] 05/01/2023, 3:44 PM

## 2023-05-01 NOTE — Progress Notes (Signed)
Physical Therapy Session Note  Patient Details  Name: Nicole [name] MRN: [id number] Date of Birth: February 18, 1976  Today's Date: 05/01/2023 PT Individual Time: 1105-1200 PT Individual Time Calculation (min): 55 min   Short Term Goals: Week 9: PT Short Term Goal 1 (Week 9): Pt will initiate bed mobility with totalA PT Short Term Goal 2 (Week 9): Pt will maintain unsupported sitting balance for 30 seconds without LOB PT Short Term Goal 3 (Week 9): Pt will complete bed<>chair transfers with no more than totalA  Skilled Therapeutic Interventions/Progress Updates:  Patient seated upright in w/c and completing OT session in day room. Patient alert and agreeable to PT session.   Patient with no pain complaint at very start of session.  Therapeutic Activity: Bed Mobility: Pt performed supine <> sit with ***. VC/ tc required for ***. Transfers: Pt performed sit<>stand and stand pivot transfers throughout session with ***. Provided verbal cues for***.  Gait Training:  Pt ambulated *** ft using *** with ***. Demonstrated ***. Provided vc/ tc for ***.  Neuromuscular Re-ed: NMR facilitated during session with focus on***. Pt guided in ***. NMR performed for improvements in motor control and coordination, balance, sequencing, judgement, and self confidence/ efficacy in performing all aspects of mobility at highest level of independence.   Patient *** at end of session with brakes locked, *** alarm set, and all needs within reach.   Therapy Documentation Precautions:  Precautions Precautions: Fall Precaution Comments: decreased stand balance- reliant on UE, Pain in LLE limits mobility at times Required Braces or Orthoses: Other Brace Other Brace: B adjustable night splints; B elbow "cosey" splints; L palm protector; R mitt Restrictions Weight Bearing Restrictions Per Provider Order: No  Pain: Pt relates pain in LLQ of abdomen at 4-5/ 10. Reported to nursing who is conversing with PA re:  potential dx and tx.    Therapy/Group: Individual Therapy  Loel Dubonnet 05/01/2023, 5:40 PM

## 2023-05-01 NOTE — Progress Notes (Signed)
PROGRESS NOTE   Subjective/Complaints: No complaints. Up with PT in bathroom. Ambulating quite a bit now!  ROS: Patient denies fever, rash, sore throat, blurred vision, dizziness, nausea, vomiting, diarrhea, cough, shortness of breath or chest pain, joint or back/neck pain, headache, or mood change.     Objective:   No results found. No results for input(s): "WBC", "HGB", "HCT", "PLT" in the last 72 hours.       No results for input(s): "NA", "K", "CL", "CO2", "GLUCOSE", "BUN", "CREATININE", "CALCIUM" in the last 72 hours.        Intake/Output Summary (Last 24 hours) at 05/01/2023 0913 Last data filed at 05/01/2023 0825 Gross per 24 hour  Intake 590 ml  Output --  Net 590 ml           Physical Exam: Vital Signs Blood pressure 110/72, pulse 64, temperature 98 F (36.7 C), resp. rate 16, height 5\' 1"  (1.549 m), weight 52.5 kg, SpO2 98%.   General: No acute distress HEENT: NCAT, EOMI, oral membranes moist Cards: reg rate  Chest: normal effort Abdomen: Soft, NT, ND Skin: dry, intact Extremities: no edema Psych: pleasant and appropriate   Skin: + Scaling dried skin on bilateral heels left buttock unstageable - not examined + Left heel stage III DTI has resolved Neuro: memory seeming to improve lately Neurologic: AAOx2, fair insight and awareness..  + Hypophonation, speech intelligability much improved. Fair insight. MAS 1 right elbow,MAS  tr to 1 in RIght finger and wrist flexors  MAS 1 in extensors of LE's MAS 1 L elbow , MAS 1+ in L wrist and finger flexors. Hypersensitivity to left hand improved, Left sided strength improved, decreased range of motion and pain at end range of motion in left shoulder- no change 12/17  Musculoskeletal: limited bilateral shoulder and elbow ROM due to increased tone ,right shoulder pain with PROM--no change 12/17     Assessment/Plan: 1. Functional deficits which  require 3+ hours per day of interdisciplinary therapy in a comprehensive inpatient rehab setting. Physiatrist is providing close team supervision and 24 hour management of active medical problems listed below. Physiatrist and rehab team continue to assess barriers to discharge/monitor patient progress toward functional and medical goals  Care Tool:  Bathing    Body parts bathed by patient: Face, Abdomen, Chest, Left upper leg, Right upper leg, Left arm, Front perineal area, Right arm, Right lower leg, Left lower leg   Body parts bathed by helper: Buttocks Body parts n/a: Right arm, Left arm, Front perineal area, Buttocks, Right upper leg, Left upper leg, Right lower leg, Left lower leg   Bathing assist Assist Level: Minimal Assistance - Patient > 75%     Upper Body Dressing/Undressing Upper body dressing   What is the patient wearing?: Pull over shirt    Upper body assist Assist Level: Minimal Assistance - Patient > 75%    Lower Body Dressing/Undressing Lower body dressing      What is the patient wearing?: Incontinence brief, Pants     Lower body assist Assist for lower body dressing: Minimal Assistance - Patient > 75%     Toileting Toileting Toileting Activity did not occur Press photographer and  hygiene only): N/A (no void or bm)  Toileting assist Assist for toileting: Moderate Assistance - Patient 50 - 74%     Transfers Chair/bed transfer  Transfers assist  Chair/bed transfer activity did not occur: Safety/medical concerns  Chair/bed transfer assist level: Minimal Assistance - Patient > 75%     Locomotion Ambulation   Ambulation assist   Ambulation activity did not occur: Safety/medical concerns  Assist level: Minimal Assistance - Patient > 75% Assistive device: Walker-rolling Max distance: 180   Walk 10 feet activity   Assist  Walk 10 feet activity did not occur: Safety/medical concerns  Assist level: Minimal Assistance - Patient >  75% Assistive device: Walker-rolling   Walk 50 feet activity   Assist Walk 50 feet with 2 turns activity did not occur: Safety/medical concerns  Assist level: Minimal Assistance - Patient > 75% Assistive device: Walker-rolling    Walk 150 feet activity   Assist Walk 150 feet activity did not occur: Safety/medical concerns  Assist level: Minimal Assistance - Patient > 75% Assistive device: Walker-rolling    Walk 10 feet on uneven surface  activity   Assist Walk 10 feet on uneven surfaces activity did not occur: Safety/medical concerns   Assist level: Minimal Assistance - Patient > 75% Assistive device: Walker-rolling   Wheelchair     Assist Is the patient using a wheelchair?: Yes Type of Wheelchair: Manual    Wheelchair assist level: Total Assistance - Patient < 25% Max wheelchair distance: 58ft    Wheelchair 50 feet with 2 turns activity    Assist    Wheelchair 50 feet with 2 turns activity did not occur: Safety/medical concerns   Assist Level: Maximal Assistance - Patient 25 - 49%   Wheelchair 150 feet activity     Assist  Wheelchair 150 feet activity did not occur: Safety/medical concerns   Assist Level: Maximal Assistance - Patient 25 - 49%   Blood pressure 110/72, pulse 64, temperature 98 F (36.7 C), resp. rate 16, height 5\' 1"  (1.549 m), weight 52.5 kg, SpO2 98%.  Medical Problem List and Plan: 1. 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 Awaiting approval of disability. Placement pending. Hoping that hospital will provide LOG for placement. Code status  DNR -Continue CIR therapies including PT, OT, and  SLP   Team conference 12/4  2.  Impaired mobility: continue Lovenox 30mg  daily, weekly creatinine ordered             -antiplatelet therapy: N/A  3. Pain:  -Knee pain: Tylenol as needed, continue voltaren gel scheduled. XR reviewed and is stable, discussed with therapy -Right wrist swelling: XR ordered and shows no acute fracture, shows 2.66mm ulnar variance, brace removed to given patient a break from it. Voltaren gel ordered prn for discomfort, continue, CT ordered and show no acute osseus injury, continue ace wrap   -Foot pain: tramadol ordered prn, improved today, continue prn  -Headaches:continue tylenol and tramadol prn -Low back pain: continue kpad, asked [place] to please bring for her -Whole body hypersensitivity: gabapentin 100mg  TID ordered -Pain in left heel: XR obtained and negative, cam boot ordered to help offload heel with therapy, discussed that this has helped -  R shoulder pain: XR neg for acute etiology on 04/06/23, voltaren gel ordered--encourage ROM as tolerated  4. History of anxiety: discussed with her mom that she feels this was a big risk factor for her accident, propanolol d/ced due to hypotension. Continue magnesium supplement  5. Neuropsych/cognition: This patient is not capable of making decisions on her own behalf  - 10/12: Still not capable of medical decision making; BUT oriented and communicating, following directions - MUCH improved - PRN melatonin 3mg    for insomnia  6. Skin: -Buttock/sacrum--foam dressing, pressure relief, nutrition -stage 3 left heel, continue foam dressing/ pressure relief  7. Fluids/Electrolytes/Nutrition: Routine in and outs with follow-up chemistries  -Eating  well with cueing   -Dysphagia: continue D3/thins--tolerating this diet -Low protein stores: d/c prosource since patient hates these and is eating 100% of meals -Suboptimal vitamin D level: increase to 5,000 U daily. continue this dose  -History of magnesium deficiency:  magnesium 500mg  stopped to support BP  -12/17 good po intake 8.  Cavitary right lower lobe pneumonia likely aspiration pneumonia/MRSA pneumonia.  Resolved    Latest Ref Rng & Units 04/18/2023    5:16 AM 04/04/2023    6:09 AM 02/05/2023   11:12 AM  CBC  WBC 4.0 - 10.5 K/uL 6.0  5.6  8.5   Hemoglobin 12.0 - 15.0 g/dL 52.8  41.3  24.4   Hematocrit 36.0 - 46.0 % 41.6  42.1  46.7   Platelets 150 - 400 K/uL 194  224  284      9.  History of drug abuse.  Positive cocaine on urine drug screen.  Provide counseling  10.  AKI/hypovolemia and ATN.  Resolved     Latest Ref Rng & Units 04/18/2023    5:16 AM 04/04/2023    6:09 AM 04/02/2023    3:39 PM  BMP  Glucose 70 - 99 mg/dL 79  91  85   BUN 6 - 20 mg/dL 17  20  17    Creatinine 0.44 - 1.00 mg/dL 0.10  2.72  5.36   Sodium 135 - 145 mmol/L 137  138  136   Potassium 3.5 - 5.1 mmol/L 3.8  3.9  4.1   Chloride 98 - 111 mmol/L 105  105  100   CO2 22 - 32 mmol/L 23  24  26    Calcium 8.9 - 10.3 mg/dL 9.0  9.3  8.8     11.  Mild transaminitis with rhabdomyolysis.  Both resolved  12.  Hypotension:  -04/29/23 BPs still soft end of normal but seems to be her baseline; monitor Vitals:   04/28/23 0605 04/28/23 1322 04/28/23 2017 04/29/23 0416  BP: 96/70 95/65 97/67  (!) 101/59   04/29/23 1342 04/29/23 1947 04/30/23 0312 04/30/23 1516  BP: 102/67 95/66 96/72  103/73   04/30/23 1950 04/30/23 2240 04/30/23 2254 05/01/23 0408  BP: 97/65 (!) 88/61 115/72 110/72     13. HTN: resolved   14. Impaired initiation: continue amantadine 100mg  BID  15. Hyperglycemia: CBGs mildly elevated, d/ced checks. Prosource d/ced as per patient's request  16. Spasticity:  -discussed serial casting with OT and her mom but she defers given pain she had when this was attempted -baclofen and tizanidine decreased due to hypotension, spasticity has been stable. Continue wrist, elbow brace, and PRAFOs. Ordered a night splint for LLE  -tizanidine decreased to 2mg   HS  -baclofen decreased to 5mg  qhs  -  17.  Dyskinetic movements in head and neck - appears to be myoclonic jerks  related to hypoxic brain injury - as discussed with pt mom, no specific meds for this on antispasticity meds , consioder benzo although with pt's SUD would avoid this class - EEG reviewed and was negative for seizure.  -  resolved   18. Cervical extensor weakness: decrease tizanidine to 2mg  HS, decrease baclofen to 5mg  QHS, improved, continue this dose  19. Bowel and bladder incontinence: continue bowel and bladder program. D/c miralax, decrease tizanidine to 2mg  HS, continue this dose  20. Fatigue: resolved, discussed with team may be secondary to anti-spasticity medications, B12 reviewed and is 335, in suboptimal range, will give 1,000U B12 injection 8/7, will order monthly as needed, metanx started, increase to BID, continue, decrease tizanidine to 2mg  HS, discussed with patient, decrease baclofen 5mg  HS   21. MRSA nares positive: negative on repeat, precautions d/ced  22. Tachycardic: resolved, d/c propanolol.  Decrease tizanidine to 2mg  HS. Resolved, amantadine increased back to 100mg  BID, continue this dose, resolved  23. S/p fall: CT head reviewed and is stable  24. Bilateral foot drop: bilateral AFOs ordered, discussed that these can help her to walk better  25. Vaginal itching: completed fluconazole. Having some discharge now. See below  26. >100k E Coli UTI  -11/28 sensitive to amoxil, completed 1 week course  27. Poor sitting balance: continue OT/PT, improving  28. Headaches: topamax 40m daily prn ordered, continue  29. Drowsiness with tramadol: d/ced  30. IUD in place? Pt states she has IUD x87yrs  and believes it's supposed to be removed soon(chart review- Dr. Seymour Bars placed Mirena IUD on 07/01/19, good for 40yrs); needs outpatient follow-up with OB/GYN      LOS: 164 days A FACE TO FACE EVALUATION WAS PERFORMED  Ranelle Oyster 05/01/2023, 9:13 AM

## 2023-05-02 LAB — BASIC METABOLIC PANEL
Anion gap: 6 (ref 5–15)
BUN: 14 mg/dL (ref 6–20)
CO2: 23 mmol/L (ref 22–32)
Calcium: 8.9 mg/dL (ref 8.9–10.3)
Chloride: 109 mmol/L (ref 98–111)
Creatinine, Ser: 0.79 mg/dL (ref 0.44–1.00)
GFR, Estimated: >60 mL/min (ref >60)
Glucose, Bld: 89 mg/dL (ref 70–99)
Potassium: 4 mmol/L (ref 3.5–5.1)
Sodium: 138 mmol/L (ref 135–145)

## 2023-05-02 LAB — CBC
HCT: 43 % (ref 36.0–46.0)
Hemoglobin: 14.8 g/dL (ref 12.0–15.0)
MCH: 32.7 pg (ref 26.0–34.0)
MCHC: 34.4 g/dL (ref 30.0–36.0)
MCV: 95.1 fL (ref 80.0–100.0)
Platelets: 241 10*3/uL (ref 150–400)
RBC: 4.52 MIL/uL (ref 3.87–5.11)
RDW: 12.6 % (ref 11.5–15.5)
WBC: 5.9 10*3/uL (ref 4.0–10.5)
nRBC: 0 % (ref 0.0–0.2)

## 2023-05-02 MED ORDER — MAGNESIUM HYDROXIDE 400 MG/5ML PO SUSP
15.0000 mL | Freq: Once | ORAL | Status: AC
  Administered 2023-05-02: 15 mL via ORAL
  Filled 2023-05-02: qty 30

## 2023-05-02 MED ORDER — ENSURE ENLIVE PO LIQD: 237.0000 mL | Freq: Two times a day (BID) | ORAL | Status: DC

## 2023-05-02 NOTE — Patient Care Conference (Signed)
Inpatient RehabilitationTeam Conference and Plan of Care Update Date: 05/02/2023   Time: 11:23 AM    Patient Name: DELCIA [name]      Medical Record Number: [id number]  Date of Birth: 06-16-1975 Sex: Female         Room/Bed: 4W09C/4W09C-01 Payor Info: Payor: [employer] / Plan: [employer] / Product Type: *No Product type* /    Admit Date/Time:  11/18/2022  3:15 PM  Primary Diagnosis:  Hypoxic brain injury [facility])  Hospital Problems: Principal Problem:   Hypoxic brain injury (HCC) Active Problems:   Protein-calorie malnutrition, severe   Essential hypertension   Pressure injury of skin    Expected Discharge Date: Expected Discharge Date:  (SNF pending)  Team Members Present: Physician leading conference: Dr. Sula Soda Social Worker Present: Dossie Der, LCSW Nurse Present: Chana Bode, RN PT Present: Wynelle Link, PT OT Present: Bretta Bang, OT SLP Present: [name], SLP PPS Coordinator present : Fae Pippin, SLP     Current Status/Progress Goal Weekly Team Focus  Bowel/Bladder   Patient is continent of bowel and bladder with intermittent incontinence episodes. LBM 12/16   Pt will gain full continence   Time toileting and assistance with toileting needs as needed    Swallow/Nutrition/ Hydration               ADL's   Min assist ADL/ Occasional mod assist   Goals min overall   functional mobility, dynamic stand balance, stand tolerance, decreased reliance on UE's in standing    Mobility   minA bed mobility, minA stand pivot transfers, CGA for straight path gait using RW 135ft, minA for functional gait with turns using the RW.   Goals upgraded to CGA/minA  Dynamic standing balance, postural control, stretching, functional gait training, NMR    Communication   decreased vocal intensity, currently supervision-modI depending on environment   modi   consistent speech intelligibility across settings/situations     Safety/Cognition/ Behavioral Observations  supervision for orientation, min-mod for mildly complex problem solving, mod assist for daily information recall and use of memory book   supervision -minA   continue to address basic-mildly complex problem solving, sustained attention, handwriting, memory of daily events    Pain   Verbalizes pain that is well managed with current pain regimen   Pain will be free of pain or stay below 3/10 pain   Assses pain 1 shift and as needed and provide education, pahrmacological and non-pharmacological pain interventions.    Skin   Erythemena to buttocks.with headed DTI to Left heel   Pt will maintain skin integrity with no further breakdown  assess skin q shift and as needed and provide skin care as nddeded      Discharge Planning:  Continue to work with management on LOG approval, no bed offer without approved disability   Team Discussion: Patient post hypoxic brain injury with poor awareness and decreased vocal intensity, although organized approach to care has improved.  Patient on target to meet rehab goals: yes, currently needs min assist for ADLs. Able to complete stand pivot transfer with light min assist and ambulate up to 150' with CGA using RW. Needs min assist for stair management and car transfers.  Needs supervision - mod I for orientation but min - mod assist for basic problem solving and mod assist for recall of daily events.   *See Care Plan and progress notes for long and short-term goals.   Revisions to Treatment Plan:  Spasticity medications  decreased per MD Timed toileting   Teaching Needs: Safety, medications, transfers, toileting, skin care, etc.   Current Barriers to Discharge: Home enviroment access/layout, Incontinence, Lack of/limited family support, and Insurance for SNF coverage  Possible Resolutions to Barriers: SNF pending Letter of guarantee approved Disability application pending approval     Medical  Summary Current Status: urinary incontience, spasticity, pain, MASD, dry skin, decreased initiation    Barriers to Discharge Comments: urinary incontinence, spasticty, pain, MASD, dry skin, decreased initiation Possible Resolutions to [employer] Focus: timed voiding intitatated, d/c baclofen at night, continue tizanidine, continue voltaren, continue Gerrhardt's butt cream, continue Eucerin BID, continue amantadine 100mg  BID   Continued Need for Acute Rehabilitation Level of Care: The patient requires daily medical management by a physician with specialized training in physical medicine and rehabilitation for the following reasons: Direction of a multidisciplinary physical rehabilitation program to maximize functional independence : Yes Medical management of patient stability for increased activity during participation in an intensive rehabilitation regime.: Yes Analysis of laboratory values and/or radiology reports with any subsequent need for medication adjustment and/or medical intervention. : Yes   I attest that I was present, lead the team conference, and concur with the assessment and plan of the team.   Chana Bode B 05/02/2023, 2:04 PM

## 2023-05-02 NOTE — Progress Notes (Signed)
Occupational Therapy Session Note  Patient Details  Name: Nicole [name] MRN: [id number] Date of Birth: [birth year]  Today's Date: 05/02/2023 OT Individual Time: 1130-1200 OT Individual Time Calculation (min): 30 min    Short Term Goals: Week 2:  OT Short Term Goal 1 (Week 2): Week 21: Pt will ambulate to BR with RW with min A with min cues for safety OT Short Term Goal 1 - Progress (Week 2): Met OT Short Term Goal 2 (Week 2): Week 21 Patient will demonstrate safe sitting balance while completing familiar BADL task with min cueing- progressing OT Short Term Goal 2 - Progress (Week 2): Other (comment) OT Short Term Goal 3 (Week 2): Week 21Pt will perform a groom task standing at the sink with min A for 1 min with min cues OT Short Term Goal 3 - Progress (Week 2): Met OT Short Term Goal 4 (Week 2): WEek 21 Pt will bend down to retrieve items in drawer in prep for ADL from standing position with min A with RW OT Short Term Goal 4 - Progress (Week 2): Other (comment) OT Short Term Goal 5 (Week 2): xxx OT Short Term Goal 5 - Progress (Week 2): Other (comment)  Skilled Therapeutic Interventions/Progress Updates:    Patient received seated in wheelchair, requesting to shower.  Deferred shower to longer pm session and patient in agreement.  Transported patient to gym to address UE functioning and range of motion.  Started session in modified plantigrade to allow LUE weight bearing.  Patient able to tolerate slight wrist extension with open hand, and nearly straight elbow.  Patient lacks full elbow extension in  LUE.  Worked on weight shifting forward/ backward to address LE and UE range of motion in closed chain condition.  Patient then transitioned to quadruped with min assist.  Patient had difficulty tolerating weight on R knee, and left wrist.  Transitioned to side sit and allowed patient to problem solve to short sitting position.  Patient walked without device and min assist to maintain upright  posture especially when standing on LLE.  Returned to room and set up with lunch tray.  Chair pad alarm in place and engaged, and call bell / personal items in reach.    Therapy Documentation Precautions:  Precautions Precautions: Fall Precaution Comments: decreased stand balance- reliant on UE, Pain in LLE limits mobility at times Required Braces or Orthoses: Other Brace Other Brace: B adjustable night splints; B elbow "cosey" splints; L palm protector; R mitt Restrictions Weight Bearing Restrictions Per Provider Order: No   Pain:  Denies pain     Therapy/Group: Individual Therapy  [name] 05/02/2023, 1:02 PM

## 2023-05-02 NOTE — Progress Notes (Signed)
PROGRESS NOTE   Subjective/Complaints: No new complaints this morning Will call mother to update her today Tolerated therapy well today Enjoyed Christmas concert!  ROS: Patient denies fever, rash, sore throat, blurred vision, dizziness, nausea, vomiting, diarrhea, cough, shortness of breath or chest pain, joint or back/neck pain, headache, or mood change.     Objective:   DG Abd 1 View Result Date: 05/01/2023 CLINICAL DATA:  Left lower quadrant abdominal pain. Possible constipation. EXAM: ABDOMEN - 1 VIEW COMPARISON:  Chest, abdomen and pelvis CT dated 10/31/2022. FINDINGS: Normal bowel-gas pattern. Mildly prominent stool. Intrauterine device in expected position. Unremarkable bones. IMPRESSION: Mildly prominent stool. Electronically Signed   By: Beckie Salts M.D.   On: 05/01/2023 17:02   Recent Labs    05/02/23 0520  WBC 5.9  HGB 14.8  HCT 43.0  PLT 241         Recent Labs    05/02/23 0520  NA 138  K 4.0  CL 109  CO2 23  GLUCOSE 89  BUN 14  CREATININE 0.79  CALCIUM 8.9          Intake/Output Summary (Last 24 hours) at 05/02/2023 1353 Last data filed at 05/01/2023 1847 Gross per 24 hour  Intake 236 ml  Output --  Net 236 ml           Physical Exam: Vital Signs Blood pressure 99/69, pulse 97, temperature 98.2 F (36.8 C), temperature source Oral, resp. rate 16, height 5\' 1"  (1.549 m), weight 52.5 kg, SpO2 98%.   General: No acute distress HEENT: NCAT, EOMI, oral membranes moist Cards: reg rate  Chest: normal effort Abdomen: Soft, NT, ND Skin: dry, intact Extremities: no edema Psych: pleasant and appropriate   Skin: + Scaling dried skin on bilateral heels left buttock unstageable - not examined + Left heel stage III DTI has resolved Neuro: memory seeming to improve lately Neurologic: AAOx2, fair insight and awareness..  + Hypophonation, speech intelligability much improved. Fair  insight. MAS 1 right elbow,MAS  tr to 1 in RIght finger and wrist flexors  MAS 1 in extensors of LE's MAS 1 L elbow , MAS 1+ in L wrist and finger flexors. Hypersensitivity to left hand improved, Left sided strength improved, decreased range of motion and pain at end range of motion in left shoulder- stable 12/18  Musculoskeletal: limited bilateral shoulder and elbow ROM due to increased tone ,right shoulder pain with PROM--no change 12/17     Assessment/Plan: 1. Functional deficits which require 3+ hours per day of interdisciplinary therapy in a comprehensive inpatient rehab setting. Physiatrist is providing close team supervision and 24 hour management of active medical problems listed below. Physiatrist and rehab team continue to assess barriers to discharge/monitor patient progress toward functional and medical goals  Care Tool:  Bathing    Body parts bathed by patient: Face, Abdomen, Chest, Left upper leg, Right upper leg, Left arm, Front perineal area, Right arm, Right lower leg, Left lower leg   Body parts bathed by helper: Buttocks Body parts n/a: Right arm, Left arm, Front perineal area, Buttocks, Right upper leg, Left upper leg, Right lower leg, Left lower leg   Bathing assist Assist  Level: Minimal Assistance - Patient > 75%     Upper Body Dressing/Undressing Upper body dressing   What is the patient wearing?: Pull over shirt    Upper body assist Assist Level: Minimal Assistance - Patient > 75%    Lower Body Dressing/Undressing Lower body dressing      What is the patient wearing?: Incontinence brief, Pants     Lower body assist Assist for lower body dressing: Minimal Assistance - Patient > 75%     Toileting Toileting Toileting Activity did not occur (Clothing management and hygiene only): N/A (no void or bm)  Toileting assist Assist for toileting: Moderate Assistance - Patient 50 - 74%     Transfers Chair/bed transfer  Transfers assist  Chair/bed transfer  activity did not occur: Safety/medical concerns  Chair/bed transfer assist level: Minimal Assistance - Patient > 75%     Locomotion Ambulation   Ambulation assist   Ambulation activity did not occur: Safety/medical concerns  Assist level: Contact Guard/Touching assist Assistive device: Walker-rolling Max distance: 150   Walk 10 feet activity   Assist  Walk 10 feet activity did not occur: Safety/medical concerns  Assist level: Contact Guard/Touching assist Assistive device: Walker-rolling   Walk 50 feet activity   Assist Walk 50 feet with 2 turns activity did not occur: Safety/medical concerns  Assist level: Contact Guard/Touching assist Assistive device: Walker-rolling    Walk 150 feet activity   Assist Walk 150 feet activity did not occur: Safety/medical concerns  Assist level: Contact Guard/Touching assist Assistive device: Walker-rolling    Walk 10 feet on uneven surface  activity   Assist Walk 10 feet on uneven surfaces activity did not occur: Safety/medical concerns   Assist level: Minimal Assistance - Patient > 75% Assistive device: Walker-rolling   Wheelchair     Assist Is the patient using a wheelchair?: Yes Type of Wheelchair: Manual    Wheelchair assist level: Moderate Assistance - Patient 50 - 74% Max wheelchair distance: 18ft    Wheelchair 50 feet with 2 turns activity    Assist    Wheelchair 50 feet with 2 turns activity did not occur: Safety/medical concerns   Assist Level: Supervision/Verbal cueing   Wheelchair 150 feet activity     Assist  Wheelchair 150 feet activity did not occur: Safety/medical concerns   Assist Level: Moderate Assistance - Patient 50 - 74%   Blood pressure 99/69, pulse 97, temperature 98.2 F (36.8 C), temperature source Oral, resp. rate 16, height 5\' 1"  (1.549 m), weight 52.5 kg, SpO2 98%.  Medical Problem List and Plan: 1. 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 Awaiting approval of disability. Placement pending. Hoping that hospital will provide LOG for placement. Code status  DNR -Continue CIR therapies including PT, OT, and SLP   Team conference 12/4  2.  Impaired mobility: continue Lovenox 30mg  daily, weekly creatinine ordered             -antiplatelet therapy: N/A  3. Pain:  -Knee pain: Tylenol as needed, continue voltaren gel scheduled. XR reviewed and is stable, discussed with therapy -Right wrist swelling: XR ordered and shows no acute fracture, shows 2.35mm ulnar variance, brace removed to  given patient a break from it. Voltaren gel ordered prn for discomfort, continue, CT ordered and show no acute osseus injury, continue ace wrap   -Foot pain: tramadol ordered prn, improved today, continue prn  -Headaches:continue tylenol and tramadol prn -Low back pain: continue kpad, asked [place] to please bring for her -Whole body hypersensitivity: gabapentin 100mg  TID ordered -Pain in left heel: XR obtained and negative, cam boot ordered to help offload heel with therapy, discussed that this has helped -R shoulder pain: XR neg for acute etiology on 04/06/23, voltaren gel ordered--encourage ROM as tolerated  4. History of anxiety: discussed with her mom that she feels this was a big risk factor for her accident, propanolol d/ced due to hypotension. Continue magnesium supplement  5. Neuropsych/cognition: This patient is not capable of making decisions on her own behalf  - 10/12: Still not capable of medical decision making; BUT oriented and communicating, following directions - MUCH improved - PRN melatonin 3mg    for insomnia  6.  Skin: -Buttock/sacrum--foam dressing, pressure relief, nutrition -stage 3 left heel, continue foam dressing/ pressure relief  7. Fluids/Electrolytes/Nutrition: Routine in and outs with follow-up chemistries  -Eating  well with cueing   -Dysphagia: continue D3/thins--tolerating this diet -Low protein stores: d/c prosource since patient hates these and is eating 100% of meals -Suboptimal vitamin D level: increase to 5,000 U daily. continue this dose  -History of magnesium deficiency: magnesium 500mg  stopped to support BP  -12/17 good po intake 8.  Cavitary right lower lobe pneumonia likely aspiration pneumonia/MRSA pneumonia.  Resolved    Latest Ref Rng & Units 05/02/2023    5:20 AM 04/18/2023    5:16 AM 04/04/2023    6:09 AM  CBC  WBC 4.0 - 10.5 K/uL 5.9  6.0  5.6   Hemoglobin 12.0 - 15.0 g/dL 09.8  11.9  14.7   Hematocrit 36.0 - 46.0 % 43.0  41.6  42.1   Platelets 150 - 400 K/uL 241  194  224      9.  History of drug abuse.  Positive cocaine on urine drug screen.  Provide counseling  10.  AKI/hypovolemia and ATN.  Resolved     Latest Ref Rng & Units 05/02/2023    5:20 AM 04/18/2023    5:16 AM 04/04/2023    6:09 AM  BMP  Glucose 70 - 99 mg/dL 89  79  91   BUN 6 - 20 mg/dL 14  17  20    Creatinine 0.44 - 1.00 mg/dL 8.29  5.62  1.30   Sodium 135 - 145 mmol/L 138  137  138   Potassium 3.5 - 5.1 mmol/L 4.0  3.8  3.9   Chloride 98 - 111 mmol/L 109  105  105   CO2 22 - 32 mmol/L 23  23  24    Calcium 8.9 - 10.3 mg/dL 8.9  9.0  9.3     11.  Mild transaminitis with rhabdomyolysis.  Both resolved  12.  Hypotension:  -04/29/23 BPs still soft end of normal but seems to be her baseline; monitor Vitals:   04/29/23 1342 04/29/23 1947 04/30/23 0312 04/30/23 1516  BP: 102/67 95/66 96/72  103/73   04/30/23 1950 04/30/23 2240 04/30/23 2254 05/01/23 0408  BP: 97/65 (!) 88/61 115/72 110/72   05/01/23 1307 05/01/23 2032 05/02/23 0531 05/02/23 1349  BP: 102/71 98/65 108/75 99/69     13.  HTN: resolved   14. Impaired initiation: continue amantadine 100mg  BID  15. Hyperglycemia: CBGs mildly elevated, d/ced  checks. Prosource d/ced as per patient's request  16. Spasticity:  -discussed serial casting with OT and her mom but she defers given pain she had when this was attempted -baclofen and tizanidine decreased due to hypotension, spasticity has been stable. Continue wrist, elbow brace, and PRAFOs. Ordered a night splint for LLE  -tizanidine decreased to 2mg  HS  -baclofen decreased to 5mg  qhs  -  17.  Dyskinetic movements in head and neck - appears to be myoclonic jerks related to hypoxic brain injury - as discussed with pt mom, no specific meds for this on antispasticity meds , consioder benzo although with pt's SUD would avoid this class - EEG reviewed and was negative for seizure.  -  resolved   18. Cervical extensor weakness: decrease tizanidine to 2mg  HS, decrease baclofen to 5mg  QHS, improved, continue this dose  19. Bowel and bladder incontinence: continue bowel and bladder program. D/c miralax, decrease tizanidine to 2mg  HS, continue this dose  20. Fatigue: resolved, discussed with team may be secondary to anti-spasticity medications, B12 reviewed and is 335, in suboptimal range, will give 1,000U B12 injection 8/7, will order monthly as needed, metanx started, increase to BID, continue, decrease tizanidine to 2mg  HS, discussed with patient, d/c baclofen   21. MRSA nares positive: negative on repeat, precautions d/ced  22. Tachycardic: resolved, d/c propanolol. Continue tizanidine 2mg  HS. Resolved, amantadine increased back to 100mg  BID, continue this dose, resolved  23. S/p fall: CT head reviewed and is stable  24. Bilateral foot drop: bilateral AFOs ordered, discussed that these can help her to walk better  25. Vaginal itching: completed fluconazole. Having some discharge now. See below  26. >100k E Coli UTI  -11/28 sensitive to amoxil, completed 1 week  course  27. Poor sitting balance: continue OT/PT, improving  28. Headaches: topamax 2m daily prn ordered, continue  29. Drowsiness with tramadol: d/ced  30. IUD in place? Pt states she has IUD x36yrs  and believes it's supposed to be removed soon(chart review- Dr. Seymour Bars placed Mirena IUD on 07/01/19, good for 66yrs); needs outpatient follow-up with OB/GYN  31. Constipation: d/c bacloen      LOS: 165 days A FACE TO FACE EVALUATION WAS PERFORMED  Clint Bolder P Karcyn Menn 05/02/2023, 1:53 PM

## 2023-05-02 NOTE — Progress Notes (Signed)
Patient ID: Nicole [name], female   DOB: 1976/03/22, 47 y.o.   MRN: [id number]  Met with Mom to discuss team conference and mom wanted MD to call her regarding pt's medical status. MD messaged she would call her. Have gotten approval for a LOG and will see if can find a bed offer. Awaiting return call from [facility] regarding bed offer.

## 2023-05-02 NOTE — Plan of Care (Signed)
  Problem: Consults Goal: RH STROKE PATIENT EDUCATION Description: See Patient Education module for education specifics  Outcome: Progressing   Problem: RH BOWEL ELIMINATION Goal: RH STG MANAGE BOWEL WITH ASSISTANCE Description: STG Manage Bowel with mod I Assistance. Outcome: Progressing Goal: RH STG MANAGE BOWEL W/MEDICATION W/ASSISTANCE Description: STG Manage Bowel with Medication with mod I Assistance. Outcome: Progressing   Problem: RH BLADDER ELIMINATION Goal: RH STG MANAGE BLADDER WITH ASSISTANCE Description: STG Manage Bladder With toileting Assistance Outcome: Progressing   Problem: RH SKIN INTEGRITY Goal: RH STG MAINTAIN SKIN INTEGRITY WITH ASSISTANCE Description: STG Maintain Skin Integrity With min  Assistance. Outcome: Progressing

## 2023-05-02 NOTE — Progress Notes (Signed)
Initial Nutrition Assessment  DOCUMENTATION CODES:   Not applicable  INTERVENTION:  Ensure Plus High Protein po BID, each supplement provides 350 kcal and 20 grams of protein.  Continue with DYS 3 diet  NUTRITION DIAGNOSIS:   Increased nutrient needs related to acute illness, other (see comment), wound healing (prolonged hospitalization, previously severe malnutrition) as evidenced by estimated needs.  Ongoing with interventions in place  GOAL:   Patient will meet greater than or equal to 90% of their needs    MONITOR:   PO intake, Labs, Supplement acceptance, Weight trends, Skin  REASON FOR ASSESSMENT:   Malnutrition Screening Tool    ASSESSMENT:   47 y.o. 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 PMH includes: anxiety, asthma, depression, drug abuse.  Patient stated that her appetite was good.  She eats between 75-100% of her meals. Is able to feed self at this time which is positive progress. She asked about ensures. Agreed that they would be beneficial.  She had no other concerns for me at this time.  Hospital weight history: Date/Time Weight Weight in lbs  04/30/23 0631 52.5 kg 115.74 lbs  04/18/23 0845 53.6 kg 118.17 lbs  04/13/23 0746 54.3 kg 119.71 lbs  03/26/23 1653 51.8 kg 114.2 lbs  03/21/23 1100 48.8 kg 107.58 lbs  02/17/23 1418 46.1 kg 101.63 lbs  02/14/23 0900 45.8 kg 100.97 lbs  02/07/23 0800 47.6 kg 104.94 lbs  01/27/23 1535 48 kg 105.82 lbs  01/11/23 1438 45.6 kg 100.53 lbs  01/03/23 1536 44.2 kg 97.44 lbs  12/20/22 1100 44.8 kg 98.77 lbs  12/13/22 1807 47.9 kg 105.6 lbs  12/02/22 1535 47.4 kg 104.5 lbs   NUTRITION - FOCUSED PHYSICAL EXAM:  Flowsheet Row Most Recent Value  Orbital Region No depletion  Upper Arm Region No depletion  Thoracic and Lumbar Region No depletion  Buccal Region No depletion  Temple Region Mild depletion  Clavicle Bone  Region Mild depletion  Clavicle and Acromion Bone Region Mild depletion  Scapular Bone Region Mild depletion  Dorsal Hand Mild depletion  Patellar Region Mild depletion  Anterior Thigh Region Mild depletion  Posterior Calf Region Mild depletion  Edema (RD Assessment) None  Hair Reviewed  [hair loss on crown during hospitalization, new growth observed.]  Eyes Reviewed  Mouth Reviewed  Skin Reviewed  Nails Reviewed       Diet Order:   Diet Order             DIET DYS 3 Room service appropriate? Yes with Assist; Fluid consistency: Thin  Diet effective now                   EDUCATION NEEDS:   Not appropriate for education at this time  Skin:  Skin Assessment: Skin Integrity Issues: Skin Integrity Issues:: Stage III DTI: left heel, dressing intact Stage III: Left heel Unstageable: Left buttocks Other: ecchymosis, abrasion to L heel; ecchymosis to L thigh; erythema/redness to buttocks  Last BM:  04/21/23, type 4-large  Height:   Ht Readings from Last 1 Encounters:  11/18/22 5\' 1"  (1.549 m)    Weight:   Wt Readings from Last 1 Encounters:  04/30/23 52.5 kg    Ideal Body Weight:  47.7 kg  BMI:  Body mass index is 21.87 kg/m.  Estimated Nutritional Needs:   Kcal:  1600-1800  Protein:  90-110 gm  Fluid:  >/= 1.6 L    Jamelle Haring RDN,  LDN Clinical Dietitian  Pleas see Amion for contact information

## 2023-05-02 NOTE — Progress Notes (Addendum)
Occupational Therapy Weekly Progress Note  Patient Details  Name: Nicole [name] MRN: [id number] Date of Birth: 1975/05/19  Beginning of progress report period: April 25, 2023 End of progress report period: May 02, 2023  Today's Date: 05/02/2023 OT Individual Time: 6295-2841 OT Individual Time Calculation (min): 72 min    Patient has met 4 of 4 short term goals.  Continued improvement in participation with BADL, Improved attention to functional task, improved use of LUE, improved self organization and functional mobility  Patient continues to demonstrate the following deficits: impaired timing and sequencing, abnormal tone, unbalanced muscle activation, and decreased coordination, decreased attention to left, decreased attention, decreased awareness, decreased problem solving, decreased safety awareness, decreased memory, and delayed processing, and decreased standing balance, decreased postural control, hemiplegia, and decreased balance strategies and therefore will continue to benefit from skilled OT intervention to enhance overall performance with BADL.  Patient progressing toward long term goals..  Continue plan of care.  OT Short Term Goals Week 2:  OT Short Term Goal 1 (Week 2): Week 21: Pt will ambulate to BR with RW with min A with min cues for safety OT Short Term Goal 1 - Progress (Week 2): Met OT Short Term Goal 2 (Week 2): Week 21 Patient will demonstrate safe sitting balance while completing familiar BADL task with min cueing- Met OT Short Term Goal 2 - Progress (Week 2): Other (comment) OT Short Term Goal 3 (Week 2): Week 21Pt will perform a groom task standing at the sink with min A for 1 min with min cues OT Short Term Goal 3 - Progress (Week 2): Met OT Short Term Goal 4 (Week 2): WEek 21 Pt will bend down to retrieve items in drawer in prep for ADL from standing position with min A with RW- Met  Week 3/ WEEK 21:  OT Short Term Goal 1 (Week 22): Patient will trial  timed toileting with mod cueing during the day OT Short Term Goal 2 (Week 22): Patient will don left sock with min assist OT Short Term Goal 3 (Week 22): Patient will put hair in ponytail using BUE and min assist  Skilled Therapeutic Interventions/Progress Updates:    Patient received seated in wheelchair, mom present but getting ready to leave.  Patient eager to shower.  Indicated need for bathroom once asked.  Transferred self from wheelchair to toilet with close supervision and use of grab bar.  Patient needing less cueing to organize self for shower - initiated removing clothing, glasses, etc.  Patient with significant loss of balance (no fall) with standing on one leg in shower then rotating around stance leg - needing assistance to regain balance.  Patient aware of loss of balance but unable to self correct effectively.  Patient attempting to use 2 hands to manage hair this session.  Needs cueing to bring left hand to hair during shampoo/conditioner and attempt to put hair into ponytail.  Patient demonstrates limited extension at MCP in left digits.  Needs further work to improve attention to LUE/ and range in hand.  Patient left up in wheelchair at ends of session, with chair pad alarm and call bell/ personal items in reach.    Therapy Documentation Precautions:  Precautions Precautions: Fall Precaution Comments: decreased stand balance- reliant on UE, Pain in LLE limits mobility at times Required Braces or Orthoses: Other Brace Other Brace: B adjustable night splints; B elbow "cosey" splints; L palm protector; R mitt Restrictions Weight Bearing Restrictions Per Provider Order: No  Pain:  Denies pain    Therapy/Group: Individual Therapy  [name] 05/02/2023, 3:44 PM

## 2023-05-02 NOTE — Progress Notes (Signed)
Physical Therapy Session Note  Patient Details  Name: Nicole [name] MRN: [id number] Date of Birth: Apr 04, 1976  Today's Date: 05/02/2023 PT Individual Time: 4098-1191 + 1000-1100 PT Individual Time Calculation (min): 55 min  + 60 min  Short Term Goals: Week 1:  PT Short Term Goal 1 (Week 1): Pt will improve bed mobility to minA consistently PT Short Term Goal 1 - Progress (Week 1): Not met PT Short Term Goal 2 (Week 1): Pt will complete functional outcome measure to assess balance and falls risk PT Short Term Goal 2 - Progress (Week 1): Not met PT Short Term Goal 3 (Week 1): Pt will ambulate 163ft with CGA and LRAD PT Short Term Goal 3 - Progress (Week 1): Not met  Skilled Therapeutic Interventions/Progress Updates:      1st session: Pt lying awkwardly in bed, almost sideways, trying to eat her breakfast. Decreased awareness of positioning or initiation to correct when cued.   Assisted patient to EOB with light minA and assist for removing bed linen. Pt reports being incontinent of soiled brief. TotalA for brief change. Assisted with UB/LB dressing with minA overall. Stand pivot transfer with minA into wheelchair and then wheeled to sinkside for her to complete oral care.   Transported to day room rehab gym at wheelchair level. Gait training 112ft with CGA and Rw - min cues for safety awareness, forward gaze, and upright posture.   Instructed in dynamic standing balance therapeutic activity while playing wii bowling - seated rest breaks b/w frames and CGA for standing balance. Mod cues for instruction and remote management, fading to min cues towards 10th frame.   Pt returned to her room and left sitting upright in wheelchair. All needs met and chair pad alarm on.    2nd session: Pt sitting in wheelchair to start - no complaints of pain. Patient eager to participate in the group [employer] Okey Regal. She participated at wheelchair level, requiring min cues for following along.  Provided cues  for vocal intensity and intelligibility. Gait training 2x184ft with CGA and RW through rehab hallways, cues for upright posture and forward gaze. Car transfer completed at [facility] level with safety cues needed for approaching. Required assist for lifting BLE in/out of vehicle and for repositioning in the car. Stair training completed using 6" steps and 2 HR - able to navigate x12 with minA with step-to pattern for both directions. Safety cues required with increased difficulty descending > ascending. Worked on standing balance using the [employer] with limits of stability - TC required for facilitating weight shifting. Patient favoring her RLE > LLE with 80% stance weight on R vs 20% stance weight on L. Used visual aid from [employer] to work on correcting this. Returned to her room at end of treatment and she was left sitting upright in wheelchair with all needs met.      Therapy Documentation Precautions:  Precautions Precautions: Fall Precaution Comments: decreased stand balance- reliant on UE, Pain in LLE limits mobility at times Required Braces or Orthoses: Other Brace Other Brace: B adjustable night splints; B elbow "cosey" splints; L palm protector; R mitt Restrictions Weight Bearing Restrictions Per Provider Order: No General:    Therapy/Group: Individual Therapy  Nicole [name] Nicole [name] 05/02/2023, 7:45 AM

## 2023-05-03 NOTE — Progress Notes (Signed)
Occupational Therapy Session Note  Patient Details  Name: [name] MRN: [id number] Date of Birth: 1975/11/16  Today's Date: 05/03/2023 OT Individual Time: 1300-1345 OT Individual Time Calculation (min): 45 min    Short Term Goals: Week 3:  OT Short Term Goal 1 (Week 3): Patient will trial timed toileting with mod cueing during the day OT Short Term Goal 1 - Progress (Week 3): Other (comment) OT Short Term Goal 2 (Week 3): Patient will don left sock with min assist OT Short Term Goal 2 - Progress (Week 3): Other (comment) OT Short Term Goal 3 (Week 3): Patient will put hair in ponytail using BUE and min assist OT Short Term Goal 3 - Progress (Week 3): Other (comment) OT Short Term Goal 4 (Week 3): xxx OT Short Term Goal 4 - Progress (Week 3): Other (comment) OT Short Term Goal 5 (Week 3): xxx OT Short Term Goal 5 - Progress (Week 3): Other (comment)  Skilled Therapeutic Interventions/Progress Updates:    Patient received as direct hand off from nursing.  Patient on toilet.  Patient unable to void, and opted to walk to shower.  Patient had several looses of balance yesterday so offered preemptive strategies before standing to avoid fall.  Patient able to follow cueing to safely transition to standing multiple times this session.  Patient washing hair bimanually without cueing.  Incontinent of bowel and bladder in shower and aware/ embarrassed.  Completed shower and transported to sink to bath and dress with min assist to help with left LE management.   Patient left up in wheelchair at sink completing grooming with chair pad alarm in place and door open.    Therapy Documentation Precautions:  Precautions Precautions: Fall Precaution Comments: decreased stand balance- reliant on UE, Pain in LLE limits mobility at times Required Braces or Orthoses: Other Brace Other Brace: B adjustable night splints; B elbow "cosey" splints; L palm protector; R mitt Restrictions Weight Bearing  Restrictions Per Provider Order: No General:   Vital Signs:  Pain:  No pain      Therapy/Group: Individual Therapy  [name] 05/03/2023, 1:58 PM

## 2023-05-03 NOTE — Progress Notes (Signed)
Physical Therapy Session Note  Patient Details  Name: Nicole [name] MRN: [id number] Date of Birth: [birth year]  Today's Date: 05/03/2023 PT Individual Time: 4098-1191 PT Individual Time Calculation (min): 45 min   Short Term Goals: Week 1:  PT Short Term Goal 1 (Week 1): Pt will improve bed mobility to minA consistently PT Short Term Goal 1 - Progress (Week 1): Not met PT Short Term Goal 2 (Week 1): Pt will complete functional outcome measure to assess balance and falls risk PT Short Term Goal 2 - Progress (Week 1): Not met PT Short Term Goal 3 (Week 1): Pt will ambulate 172ft with CGA and LRAD PT Short Term Goal 3 - Progress (Week 1): Not met  Skilled Therapeutic Interventions/Progress Updates:      Pt in bed to start - has no pain but requests a clean brief. Brief change completed with totalA - old brief dry. Supine<>sitting EOB with supervision - able to remove bed linen off her legs/feet.   Sitting balance without LOB while statically sitting. Completed UB/LB dressing, minA for UB and modA for LB. Donned socks and shoes with totalA. Pt ambulates within her room with no AD and minA, selects clothes out of drawer.  Completes oral care and brushing her hair while standing at the sink with CGA for balance, no LOB noted while static standing.   Transported to day room rehab gym and assisted onto [place] with minA stand pivot transfer, safety cues for approach and for sitting on [place]. Used seat belt for added safety. Completed for x9 minutes at L40cm/sec resistance, encouraging full AROM bilaterally (decreased on L >R).   Returned to her room at end of session, left sitting upright in wheelchair with all needs met. Pt thankful for care.      Therapy Documentation Precautions:  Precautions Precautions: Fall Precaution Comments: decreased stand balance- reliant on UE, Pain in LLE limits mobility at times Required Braces or Orthoses: Other Brace Other Brace: B adjustable night  splints; B elbow "cosey" splints; L palm protector; R mitt Restrictions Weight Bearing Restrictions Per Provider Order: No General:     Therapy/Group: Individual Therapy  Orrin Brigham 05/03/2023, 7:37 AM

## 2023-05-03 NOTE — Progress Notes (Signed)
Speech Language Pathology Daily Session Note  Patient Details  Name: Nicole [name] MRN: [id number] Date of Birth: January 03, 1976  Today's Date: 05/03/2023 SLP Individual Time: 1001-1100 SLP Individual Time Calculation (min): 59 min  Short Term Goals: Week 9: SLP Short Term Goal 1 (Week 9): Week 23 - Pt will solve mildly complex environmental problems with 90% accuracy given min assist. SLP Short Term Goal 2 (Week 9): Week 23 - Pt will recall basic daily information using external aids with 75% accuracy given mod assist. SLP Short Term Goal 3 (Week 9): Week 23 - Pt will demonstrate sustained attention to basic tasks for 45 minutes with supervision A multimodal cues for redirection. SLP Short Term Goal 4 (Week 9): Week 23 - Pt will utilize increased vocal intensity w/ supervision verbal cues to remain 90% intelligibile at conversation level  Skilled Therapeutic Interventions: Skilled therapy session focused on cognitive goals. SLP faciliated session by providing supervision A during creation of Christmas shopping list and budget. Patient created list for 6 people and budgeted 100$ to spend in hospital gift shop. Upon arrival, patient utilized supervision A to identify appropriate christmas gifts utilizing budget. Upon return to room, patient incontinent of bladder. SLP/NT aided in pericare. Patient left in [facility] with alarm set and call bell in reach. Continue POC.  Pain None reported  Therapy/Group: Individual Therapy  Babatunde Seago M.A., CF-SLP 05/03/2023, 7:37 AM

## 2023-05-03 NOTE — Progress Notes (Signed)
Recreational Therapy Session Note  Patient Details  Name: Nicole [name] MRN: [id number] Date of Birth: February 18, 1976 Today's Date: 05/03/2023  Pain: no c/o  Skilled Therapeutic Interventions/Progress Updates: Goal:  Pt will actively participate in 60 minutes therapeutic dance group for 60 minutes.  MET  Pt participated in dance group emphasizing activity tolerance, dynamic sitting balance, UE use/strengthening/stretching, safety awareness and socialization.  Pt smiling, laughing and singing throughout the group.  Pt participated at supervision level needing demonstration and min instructional cues for modifications.    Therapy/Group: Group Therapy  Thomasene Dubow 05/03/2023, 4:10 PM

## 2023-05-03 NOTE — Progress Notes (Signed)
PROGRESS NOTE   Subjective/Complaints: No new complaints this morning Letter signed for mom, appreciate SW's help! Tolerated therapy well today  ROS: Patient denies fever, rash, sore throat, blurred vision, dizziness, nausea, vomiting, diarrhea, cough, shortness of breath or chest pain, joint or back/neck pain, headache, or mood change.     Objective:   DG Abd 1 View Result Date: 05/01/2023 CLINICAL DATA:  Left lower quadrant abdominal pain. Possible constipation. EXAM: ABDOMEN - 1 VIEW COMPARISON:  Chest, abdomen and pelvis CT dated 10/31/2022. FINDINGS: Normal bowel-gas pattern. Mildly prominent stool. Intrauterine device in expected position. Unremarkable bones. IMPRESSION: Mildly prominent stool. Electronically Signed   By: Beckie Salts M.D.   On: 05/01/2023 17:02   Recent Labs    05/02/23 0520  WBC 5.9  HGB 14.8  HCT 43.0  PLT 241         Recent Labs    05/02/23 0520  NA 138  K 4.0  CL 109  CO2 23  GLUCOSE 89  BUN 14  CREATININE 0.79  CALCIUM 8.9          Intake/Output Summary (Last 24 hours) at 05/03/2023 1043 Last data filed at 05/03/2023 0835 Gross per 24 hour  Intake 714 ml  Output --  Net 714 ml           Physical Exam: Vital Signs Blood pressure 105/71, pulse 78, temperature 97.7 F (36.5 C), resp. rate 18, height 5\' 1"  (1.549 m), weight 52.5 kg, SpO2 98%.   General: No acute distress HEENT: NCAT, EOMI, oral membranes moist Cards: reg rate  Chest: normal effort Abdomen: Soft, NT, ND Skin: dry, intact Extremities: no edema Psych: pleasant and appropriate   Skin: + Scaling dried skin on bilateral heels left buttock unstageable - not examined + Left heel stage III DTI has resolved Neuro: memory seeming to improve lately Neurologic: AAOx2, fair insight and awareness..  + Hypophonation, speech intelligability much improved. Fair insight. MAS 1 right elbow,MAS  tr to 1 in  RIght finger and wrist flexors  MAS 1 in extensors of LE's MAS 1 L elbow , MAS 1+ in L wrist and finger flexors. Hypersensitivity to left hand improved, Left sided strength improved, decreased range of motion and pain at end range of motion in left shoulder- stable 12/18  Musculoskeletal: limited bilateral shoulder and elbow ROM due to increased tone ,right shoulder pain with PROM--stable 12/18     Assessment/Plan: 1. Functional deficits which require 3+ hours per day of interdisciplinary therapy in a comprehensive inpatient rehab setting. Physiatrist is providing close team supervision and 24 hour management of active medical problems listed below. Physiatrist and rehab team continue to assess barriers to discharge/monitor patient progress toward functional and medical goals  Care Tool:  Bathing    Body parts bathed by patient: Face, Abdomen, Chest, Left upper leg, Right upper leg, Left arm, Front perineal area, Right arm, Right lower leg, Left lower leg, Buttocks   Body parts bathed by helper: Buttocks Body parts n/a: Right arm, Left arm, Front perineal area, Buttocks, Right upper leg, Left upper leg, Right lower leg, Left lower leg   Bathing assist Assist Level: Contact Guard/Touching assist  Upper Body Dressing/Undressing Upper body dressing   What is the patient wearing?: Pull over shirt    Upper body assist Assist Level: Set up assist    Lower Body Dressing/Undressing Lower body dressing      What is the patient wearing?: Incontinence brief, Pants     Lower body assist Assist for lower body dressing: Minimal Assistance - Patient > 75%     Toileting Toileting Toileting Activity did not occur (Clothing management and hygiene only): N/A (no void or bm)  Toileting assist Assist for toileting: Minimal Assistance - Patient > 75%     Transfers Chair/bed transfer  Transfers assist  Chair/bed transfer activity did not occur: Safety/medical concerns  Chair/bed  transfer assist level: Contact Guard/Touching assist     Locomotion Ambulation   Ambulation assist   Ambulation activity did not occur: Safety/medical concerns  Assist level: Contact Guard/Touching assist Assistive device: Walker-rolling Max distance: 150   Walk 10 feet activity   Assist  Walk 10 feet activity did not occur: Safety/medical concerns  Assist level: Contact Guard/Touching assist Assistive device: Walker-rolling   Walk 50 feet activity   Assist Walk 50 feet with 2 turns activity did not occur: Safety/medical concerns  Assist level: Contact Guard/Touching assist Assistive device: Walker-rolling    Walk 150 feet activity   Assist Walk 150 feet activity did not occur: Safety/medical concerns  Assist level: Contact Guard/Touching assist Assistive device: Walker-rolling    Walk 10 feet on uneven surface  activity   Assist Walk 10 feet on uneven surfaces activity did not occur: Safety/medical concerns   Assist level: Minimal Assistance - Patient > 75% Assistive device: Walker-rolling   Wheelchair     Assist Is the patient using a wheelchair?: Yes Type of Wheelchair: Manual    Wheelchair assist level: Moderate Assistance - Patient 50 - 74% Max wheelchair distance: 71ft    Wheelchair 50 feet with 2 turns activity    Assist    Wheelchair 50 feet with 2 turns activity did not occur: Safety/medical concerns   Assist Level: Supervision/Verbal cueing   Wheelchair 150 feet activity     Assist  Wheelchair 150 feet activity did not occur: Safety/medical concerns   Assist Level: Moderate Assistance - Patient 50 - 74%   Blood pressure 105/71, pulse 78, temperature 97.7 F (36.5 C), resp. rate 18, height 5\' 1"  (1.549 m), weight 52.5 kg, SpO2 98%.  Medical Problem List and Plan: 1. 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 Awaiting approval of disability. Placement pending. Hoping that hospital will provide LOG for placement. Code status  DNR -Continue CIR therapies including PT, OT, and SLP   Team conference 12/4  2.  Impaired mobility: continue Lovenox 30mg  daily, weekly creatinine ordered             -antiplatelet therapy: N/A  3. Pain:  -Knee pain: Tylenol as needed, continue voltaren gel scheduled. XR reviewed and is stable, discussed with therapy -Right wrist swelling: XR ordered and shows no acute fracture, shows 2.41mm ulnar variance, brace removed to given patient a break from it. Voltaren gel ordered prn for discomfort, continue, CT ordered and show no acute osseus injury,  continue ace wrap   -Foot pain: tramadol ordered prn, improved today, continue prn  -Headaches:continue tylenol and tramadol prn -Low back pain: continue kpad, asked [place] to please bring for her -Whole body hypersensitivity: gabapentin 100mg  TID ordered -Pain in left heel: XR obtained and negative, cam boot ordered to help offload heel with therapy, discussed that this has helped -R shoulder pain: XR neg for acute etiology on 04/06/23, voltaren gel ordered--encourage ROM as tolerated  4. History of anxiety: discussed with her mom that she feels this was a big risk factor for her accident, propanolol d/ced due to hypotension. Continue magnesium supplement  5. Neuropsych/cognition: This patient is not capable of making decisions on her own behalf  - 10/12: Still not capable of medical decision making; BUT oriented and communicating, following directions - MUCH improved - PRN melatonin 3mg    for insomnia  6. Skin: -Buttock/sacrum--foam dressing, pressure relief, nutrition -stage 3 left heel, continue  foam dressing/ pressure relief  7. Fluids/Electrolytes/Nutrition: Routine in and outs with follow-up chemistries  -Eating  well with cueing   -Dysphagia: continue D3/thins--tolerating this diet -Low protein stores: d/c prosource since patient hates these and is eating 100% of meals -Suboptimal vitamin D level: increase to 5,000 U daily. continue this dose  -History of magnesium deficiency: magnesium 500mg  stopped to support BP  -12/17 good po intake 8.  Cavitary right lower lobe pneumonia likely aspiration pneumonia/MRSA pneumonia.  Resolved    Latest Ref Rng & Units 05/02/2023    5:20 AM 04/18/2023    5:16 AM 04/04/2023    6:09 AM  CBC  WBC 4.0 - 10.5 K/uL 5.9  6.0  5.6   Hemoglobin 12.0 - 15.0 g/dL 16.1  09.6  04.5   Hematocrit 36.0 - 46.0 % 43.0  41.6  42.1   Platelets 150 - 400 K/uL 241  194  224      9.  History of drug abuse.  Positive cocaine on urine drug screen.  Provide counseling  10.  AKI/hypovolemia and ATN.  Resolved     Latest Ref Rng & Units 05/02/2023    5:20 AM 04/18/2023    5:16 AM 04/04/2023    6:09 AM  BMP  Glucose 70 - 99 mg/dL 89  79  91   BUN 6 - 20 mg/dL 14  17  20    Creatinine 0.44 - 1.00 mg/dL 4.09  8.11  9.14   Sodium 135 - 145 mmol/L 138  137  138   Potassium 3.5 - 5.1 mmol/L 4.0  3.8  3.9   Chloride 98 - 111 mmol/L 109  105  105   CO2 22 - 32 mmol/L 23  23  24    Calcium 8.9 - 10.3 mg/dL 8.9  9.0  9.3     11.  Mild transaminitis with rhabdomyolysis.  Both resolved  12.  Hypotension:  -04/29/23 BPs still soft end of normal but seems to be her baseline; monitor Vitals:   04/30/23 0312 04/30/23 1516 04/30/23 1950 04/30/23 2240  BP: 96/72 103/73 97/65 (!) 88/61   04/30/23 2254 05/01/23 0408 05/01/23 1307 05/01/23 2032  BP: 115/72 110/72 102/71 98/65   05/02/23 0531 05/02/23 1349 05/02/23 2100 05/03/23 0428  BP: 108/75 99/69 105/69 105/71     13. HTN: resolved   14. Impaired initiation: continue amantadine 100mg  BID  15. Hyperglycemia:  CBGs mildly elevated, d/ced checks. Prosource d/ced as per patient's request  16. Spasticity:  Continue wrist, elbow brace, and PRAFOs. Ordered a night splint  for LLE  D/c baclofen and tizanidine- much improved  17.  Dyskinetic movements in head and neck - appears to be myoclonic jerks related to hypoxic brain injury - as discussed with pt mom, no specific meds for this on antispasticity meds , consioder benzo although with pt's SUD would avoid this class - EEG reviewed and was negative for seizure.  -  resolved   18. Cervical extensor weakness: decrease tizanidine to 2mg  HS, decrease baclofen to 5mg  QHS, improved, continue this dose  19. Bowel and bladder incontinence: continue bowel and bladder program. D/c miralax, decrease tizanidine to 2mg  HS, continue this dose  20. Fatigue: resolved, discussed with team may be secondary to anti-spasticity medications, B12 reviewed and is 335, in suboptimal range, will give 1,000U B12 injection 8/7, will order monthly as needed, metanx started, increase to BID, continue, decrease tizanidine to 2mg  HS, discussed with patient, d/c baclofen   21. MRSA nares positive: negative on repeat, precautions d/ced  22. Tachycardic: resolved, d/c propanolol. Continue tizanidine 2mg  HS. Resolved, amantadine increased back to 100mg  BID, continue this dose, resolved  23. S/p fall: CT head reviewed and is stable  24. Bilateral foot drop: bilateral AFOs ordered, discussed that these can help her to walk better  25. Vaginal itching: completed fluconazole. Having some discharge now. See below  26. >100k E Coli UTI  -11/28 sensitive to amoxil, completed 1 week course  27. Poor sitting balance: continue OT/PT, improving  28. Headaches: topamax 31m daily prn ordered, continue  29. Drowsiness with tramadol: d/ced  30. IUD in place? Pt states she has IUD x10yrs  and believes it's supposed to be removed soon(chart review- Dr. Seymour Bars placed Mirena IUD on 07/01/19, good  for 38yrs); needs outpatient follow-up with OB/GYN  31. Constipation: d/c baclofen, resolved  32. History of drug addiction: tramadol d/ced      LOS: 166 days A FACE TO FACE EVALUATION WAS PERFORMED  Drema Pry Brayen Bunn 05/03/2023, 10:43 AM

## 2023-05-03 NOTE — Group Note (Signed)
Patient Details Name: KEMIYA RAMSBURG MRN: [id number] DOB: 01/27/1976 Today's Date: 05/03/2023  Time Calculation: OT Group Time Calculation OT Group Start Time: 1430 OT Group Stop Time: 1530 OT Group Time Calculation (min): 60 min      Group Description: Dance Group: Pt participated in dance group with an emphasis on social interaction, motor planning, increasing overall activity tolerance and bimanual tasks. All songs were selected by group members. Dance moves included AROM of BUE/BLE gross motor movements with an emphasis on building functional endurance.    Individual level documentation: Patient completed group from sitting level. Patientt needed supervision to complete various dance moves with cues for motivation and body positioning.  Patient needed min modifications during group.  Pain: 0/10 reported during/following session   Precautions:  Falls  JOEL YORK 05/03/2023, 4:16 PM

## 2023-05-03 NOTE — Progress Notes (Signed)
Physical Therapy Weekly Progress Note  Patient Details  Name: SHECID WASSMANN MRN: [id number] Date of Birth: [birth year]  Beginning of progress report period: April 25, 2023 End of progress report period: May 03, 2023  Ms. Ziller is continuing to make significant progress in functional mobility. She currently requires minA for bed mobility, CGA to minA for stand pivot tranfers, CGA to minA for functional ambulation with a youth sized RW >159ft, and minA for navigating x12, 6" steps using 2 hand rails. She continues to be limited primarily by cognitive deficits, decreased insight, impaired dynamic standing balance with poor balance recovery, and remains a high falls risk.   Patient continues to demonstrate the following deficits muscle weakness and muscle joint tightness, decreased cardiorespiratoy endurance, abnormal tone, unbalanced muscle activation, and motor apraxia, decreased motor planning, decreased initiation, decreased attention, decreased awareness, decreased problem solving, and decreased safety awareness, and decreased sitting balance, decreased standing balance, decreased postural control, and decreased balance strategies and therefore will continue to benefit from skilled PT intervention to increase functional independence with mobility.  Patient progressing toward long term goals..  Continue plan of care.  PT Short Term Goals Week 1:  PT Short Term Goal 1 (Week 1): Pt will complete bed mobility with CGA PT Short Term Goal 1 - Progress (Week 1): Not met PT Short Term Goal 2 (Week 1): Pt will ambulate 161ft with CGA and LRAD PT Short Term Goal 2 - Progress (Week 1): Not met PT Short Term Goal 3 (Week 1): Pt will complete functional outcome measure to assess falls risk PT Short Term Goal 3 - Progress (Week 1): Not met    Therapy Documentation Precautions:  Precautions Precautions: Fall Precaution Comments: decreased stand balance- reliant on UE, Pain in LLE limits mobility at  times Required Braces or Orthoses: Other Brace Other Brace: B adjustable night splints; B elbow "cosey" splints; L palm protector; R mitt Restrictions Weight Bearing Restrictions Per Provider Order: No General:    Shalane Florendo P Keonta Monceaux  PT, DPT, CSRS  05/03/2023, 7:38 AM

## 2023-05-04 MED ORDER — AMANTADINE HCL 100 MG PO CAPS
100.0000 mg | ORAL_CAPSULE | Freq: Every day | ORAL | Status: DC
Start: 2023-05-05 — End: 2023-05-11
  Administered 2023-05-05 – 2023-05-11 (×7): 100 mg via ORAL
  Filled 2023-05-04 (×7): qty 1

## 2023-05-04 NOTE — Progress Notes (Signed)
Physical Therapy Session Note  Patient Details  Name: Nicole [name] MRN: [id number] Date of Birth: 1975/11/12  Today's Date: 05/04/2023 PT Individual Time: 1045-1200 PT Individual Time Calculation (min): 75 min   Short Term Goals: Week 1:  PT Short Term Goal 1 (Week 1): Pt will complete bed mobility with CGA PT Short Term Goal 1 - Progress (Week 1): Not met PT Short Term Goal 2 (Week 1): Pt will ambulate 169ft with CGA and LRAD PT Short Term Goal 2 - Progress (Week 1): Not met PT Short Term Goal 3 (Week 1): Pt will complete functional outcome measure to assess falls risk PT Short Term Goal 3 - Progress (Week 1): Not met  Skilled Therapeutic Interventions/Progress Updates:      Pt sitting upright in wheelchair, gathering items from her drawer at wheelchair level. Patient appears safe and no concern for falling. Reminded her of fall precautions and strategies to keep her safe while sitting in the wheelchair.   Sit<>Stand to RW with CGA. She ambulates with CGA and RW through rehab hallways, >222ft. Worked on increasing postural extension and improving her forward gaze to reduce pressure on her lower back.   Pt requesting a hot chocolate from the nurses station. She assisted with opening the packets and PT poured/mixed for safety concerns. Ice also added to reduce risk of injury. Pt needing cues for pacing herself while drinking, tends to drink fast with large sips. Pt mildly impulsive while trying to stand from the rolling chair at the nurses station, resulting in a LOB posteriorly. Educated on safety awareness and general precautions for fall prevention.   In main rehab gym, tasked patient to work on Armed forces training and education officer pay, Photographer shopping, and Psychologist, forensic. Used functional simulated tasks to work on cognition, executive functioning for complex tasks. Tasks used web-based activities to manipulate fake bills, credit cards, understanding website directions, referring to  information from the bill/card, and solving problems based on feedback from website. Patient required mod cues overall for these activities.   Pt ambulated back to her room with CGA and RW - continued cues as above. Pt left sitting upright in wheelchair with family present. All needs met.   Therapy Documentation Precautions:  Precautions Precautions: Fall Precaution Comments: decreased stand balance- reliant on UE, Pain in LLE limits mobility at times Required Braces or Orthoses: Other Brace Other Brace: B adjustable night splints; B elbow "cosey" splints; L palm protector; R mitt Restrictions Weight Bearing Restrictions Per Provider Order: No General:     Therapy/Group: Individual Therapy  Orrin Brigham 05/04/2023, 7:39 AM

## 2023-05-04 NOTE — Plan of Care (Signed)
  Problem: Consults Goal: RH STROKE PATIENT EDUCATION Description: See Patient Education module for education specifics  Outcome: Progressing   Problem: RH BOWEL ELIMINATION Goal: RH STG MANAGE BOWEL WITH ASSISTANCE Description: STG Manage Bowel with mod I Assistance. Outcome: Progressing Goal: RH STG MANAGE BOWEL W/MEDICATION W/ASSISTANCE Description: STG Manage Bowel with Medication with mod I Assistance. Outcome: Progressing   Problem: RH BLADDER ELIMINATION Goal: RH STG MANAGE BLADDER WITH ASSISTANCE Description: STG Manage Bladder With toileting Assistance Outcome: Progressing   Problem: RH SKIN INTEGRITY Goal: RH STG MAINTAIN SKIN INTEGRITY WITH ASSISTANCE Description: STG Maintain Skin Integrity With min  Assistance. Outcome: Progressing

## 2023-05-04 NOTE — Progress Notes (Signed)
Speech Language Pathology Weekly Progress and Session Note  Patient Details  Name: Nicole [name] MRN: [id number] Date of Birth: July 13, 1975  Beginning of progress report period: April 26, 2023 End of progress report period: May 04, 2023  Today's Date: 05/04/2023 SLP Individual Time: 1430 - 1530   Short Term Goals: Week 9: SLP Short Term Goal 1 (Week 9): Week 23 - Pt will solve mildly complex environmental problems with 90% accuracy given min assist. SLP Short Term Goal 1 - Progress (Week 9): Not met SLP Short Term Goal 2 (Week 9): Week 23 - Pt will recall basic daily information using external aids with 75% accuracy given mod assist. SLP Short Term Goal 2 - Progress (Week 9): Met SLP Short Term Goal 3 (Week 9): Week 23 - Pt will demonstrate sustained attention to basic tasks for 45 minutes with supervision A multimodal cues for redirection. SLP Short Term Goal 3 - Progress (Week 9): Met SLP Short Term Goal 4 (Week 9): Week 23 - Pt will utilize increased vocal intensity w/ supervision verbal cues to remain 90% intelligibile at conversation level SLP Short Term Goal 4 - Progress (Week 9): Met  New Short Term Goals: Week 9: SLP Short Term Goal 1 (Week 9): Week 24 - Pt will solve mildly complex environmental problems with 50% accuracy given min assist. SLP Short Term Goal 2 (Week 9): Week 24 - Pt will recall basic daily information using external aids with 80% accuracy given mod assist. SLP Short Term Goal 3 (Week 9): Week 24 - Pt will utilize recommended handwriting strategies to achieve 80% legebility given mod verbal cues in 4/5 opportunities at the phrase and sentence levels. SLP Short Term Goal 4 (Week 9): Week 24 - Pt will utilize increased vocal intensity w/ min verbal cues to remain 90% intelligibile at conversation level in noisy environments in 4/5 opportunities.  Weekly Progress Updates: Patient continues to make modest progress towards therapy goals, this reporting period  meeting 3/4 short term goals set. Patient currently is improving cognitively and requires mod-max assist for mildly complex problem solving. Patient recalls basic daily information using external aids with 75% accuracy given mod assist and requires supervision to sustain attention to task (selective attention requires more cueing at this time). Patient is 90% intelligible given supervision in a quiet environment, though requires more cues when in a noisy environment, and goals adjusted to reflect this need. Patient and family education ongoing. Patient will continue to benefit from skilled therapy services during remainder of CIR stay.    Intensity: Minumum of 1-2 x/day, 30 to 90 minutes Frequency: 3 to 5 out of 7 days Duration/Length of Stay: TBD due to SNF placement Treatment/Interventions: Cognitive remediation/compensation;Internal/external aids;Speech/Language facilitation;Cueing hierarchy;Environmental controls;Therapeutic Activities;Functional tasks;Multimodal communication approach;Patient/family education;Therapeutic Exercise  Daily Session  Skilled Therapeutic Interventions: SLP conducted skilled therapy session targeting cognitive retraining goals. Patient completed holiday-themed cognitive puzzles including word unscrambling (required mod assist), word search (required supervision), and then transported to day room to complete riddle scavenger hunt where she required max assist to cognitively manage wheelchair mobility and supervision-min to solve verbally presented riddles. Overall, functional cognition requires significantly increased cueing compared to verbal cognition. During day room tasks, patient benefited from mod verbal cues to increase volume due to ambient noise. Patient was left in lowered bed with call bell in reach and bed alarm set. SLP will continue to target goals per plan of care.      Pain: Back pain, 6/10, alerted nursing  Therapy/Group: Individual  Therapy  [name], M.A., CCC-SLP  [name] 05/04/2023, 12:54 PM

## 2023-05-04 NOTE — Progress Notes (Signed)
Patient ID: Nicole [name], female   DOB: [birth year], 46 y.o.   MRN: [id number]  Have written letter for MD for Mom to give to bill collectors. Have also reached out again to [facility] regarding possible bed offer and made aware again her disability has not been approved yet. Will await response.

## 2023-05-04 NOTE — Progress Notes (Signed)
Occupational Therapy Session Note  Patient Details  Name: Nicole [name] MRN: [id number] Date of Birth: [birth year]  Today's Date: 05/04/2023 OT Individual Time: 0905- 1000 0T minutes:  55 min      Short Term Goals: Week 3:  OT Short Term Goal 1 (Week 3): Patient will trial timed toileting with mod cueing during the day OT Short Term Goal 1 - Progress (Week 3): Other (comment) OT Short Term Goal 2 (Week 3): Patient will don left sock with min assist OT Short Term Goal 2 - Progress (Week 3): Other (comment) OT Short Term Goal 3 (Week 3): Patient will put hair in ponytail using BUE and min assist OT Short Term Goal 3 - Progress (Week 3): Other (comment) OT Short Term Goal 4 (Week 3): xxx OT Short Term Goal 4 - Progress (Week 3): Other (comment) OT Short Term Goal 5 (Week 3): xxx OT Short Term Goal 5 - Progress (Week 3): Other (comment)  Skilled Therapeutic Interventions/Progress Updates:    Patient received supine in bed, awake, alert and using call bell to request toileting!  Brief clean and dry!  Transported patient to bathroom via wheelchair for urgency.  Able to stand and manage pants with BUE, maintaining stand balance.  Patient walked from toilet to shower without device at a safe pace using grab bars.  Attempted to shower standing, but encouraged to start seated for safety.  Did complete aspects of shower standing without use of grab bar with intermittent steadying assist.  Patient indicates and able to articulate visual changes since injury - reports more frequent migraines, and difficulty with gaze stabilization.  Also reports difficulty reading clock in room.  Left up in wheelchair with chair pad alarm in place and personal items in reach.     Therapy Documentation Precautions:  Precautions Precautions: Fall Precaution Comments: decreased stand balance- reliant on UE, Pain in LLE limits mobility at times Required Braces or Orthoses: Other Brace Other Brace: B adjustable night  splints; B elbow "cosey" splints; L palm protector; R mitt Restrictions Weight Bearing Restrictions Per Provider Order: No   Pain:  No pain    Therapy/Group: Individual Therapy  [name] 05/04/2023, 9:13 AM

## 2023-05-04 NOTE — Progress Notes (Signed)
PROGRESS NOTE   Subjective/Complaints: No new complaints this morning Just completing shower with OT Appreciate SW efforts on disposition Hypotensive, will decrease amantadine to daily  ROS: Patient denies fever, rash, sore throat, blurred vision, dizziness, nausea, vomiting, diarrhea, cough, shortness of breath or chest pain, joint or back/neck pain, headache, or mood change.     Objective:   No results found.  Recent Labs    05/02/23 0520  WBC 5.9  HGB 14.8  HCT 43.0  PLT 241         Recent Labs    05/02/23 0520  NA 138  K 4.0  CL 109  CO2 23  GLUCOSE 89  BUN 14  CREATININE 0.79  CALCIUM 8.9          Intake/Output Summary (Last 24 hours) at 05/04/2023 1214 Last data filed at 05/03/2023 1831 Gross per 24 hour  Intake 240 ml  Output --  Net 240 ml           Physical Exam: Vital Signs Blood pressure (!) 107/58, pulse 81, temperature 97.8 F (36.6 C), temperature source Oral, resp. rate 18, height 5\' 1"  (1.549 m), weight 52.5 kg, SpO2 100%.   General: No acute distress HEENT: NCAT, EOMI, oral membranes moist Cards: reg rate  Chest: normal effort Abdomen: Soft, NT, ND Skin: dry, intact Extremities: no edema Psych: pleasant and appropriate   Skin: + Scaling dried skin on bilateral heels left buttock unstageable - not examined + Left heel stage III DTI has resolved Neuro: memory seeming to improve lately Neurologic: AAOx2, fair insight and awareness..  + Hypophonation, speech intelligability much improved. Fair insight. MAS 1 right elbow,MAS  tr to 1 in RIght finger and wrist flexors  MAS 1 in extensors of LE's MAS 1 L elbow , MAS 1+ in L wrist and finger flexors. Hypersensitivity to left hand improved, Left sided strength improved, decreased range of motion and pain at end range of motion in left shoulder- stable 12/18  Musculoskeletal: limited bilateral shoulder and elbow ROM  due to increased tone ,right shoulder pain with PROM--stable 12/20     Assessment/Plan: 1. Functional deficits which require 3+ hours per day of interdisciplinary therapy in a comprehensive inpatient rehab setting. Physiatrist is providing close team supervision and 24 hour management of active medical problems listed below. Physiatrist and rehab team continue to assess barriers to discharge/monitor patient progress toward functional and medical goals  Care Tool:  Bathing    Body parts bathed by patient: Face, Abdomen, Chest, Left upper leg, Right upper leg, Left arm, Front perineal area, Right arm, Right lower leg, Left lower leg, Buttocks   Body parts bathed by helper: Buttocks Body parts n/a: Right arm, Left arm, Front perineal area, Buttocks, Right upper leg, Left upper leg, Right lower leg, Left lower leg   Bathing assist Assist Level: Contact Guard/Touching assist     Upper Body Dressing/Undressing Upper body dressing   What is the patient wearing?: Pull over shirt    Upper body assist Assist Level: Set up assist    Lower Body Dressing/Undressing Lower body dressing      What is the patient wearing?: Incontinence brief, Pants  Lower body assist Assist for lower body dressing: Minimal Assistance - Patient > 75%     Toileting Toileting Toileting Activity did not occur Press photographer and hygiene only): N/A (no void or bm)  Toileting assist Assist for toileting: Minimal Assistance - Patient > 75%     Transfers Chair/bed transfer  Transfers assist  Chair/bed transfer activity did not occur: Safety/medical concerns  Chair/bed transfer assist level: Contact Guard/Touching assist     Locomotion Ambulation   Ambulation assist   Ambulation activity did not occur: Safety/medical concerns  Assist level: Contact Guard/Touching assist Assistive device: Walker-rolling Max distance: 150   Walk 10 feet activity   Assist  Walk 10 feet activity did not  occur: Safety/medical concerns  Assist level: Contact Guard/Touching assist Assistive device: Walker-rolling   Walk 50 feet activity   Assist Walk 50 feet with 2 turns activity did not occur: Safety/medical concerns  Assist level: Contact Guard/Touching assist Assistive device: Walker-rolling    Walk 150 feet activity   Assist Walk 150 feet activity did not occur: Safety/medical concerns  Assist level: Contact Guard/Touching assist Assistive device: Walker-rolling    Walk 10 feet on uneven surface  activity   Assist Walk 10 feet on uneven surfaces activity did not occur: Safety/medical concerns   Assist level: Minimal Assistance - Patient > 75% Assistive device: Walker-rolling   Wheelchair     Assist Is the patient using a wheelchair?: Yes Type of Wheelchair: Manual    Wheelchair assist level: Moderate Assistance - Patient 50 - 74% Max wheelchair distance: 4ft    Wheelchair 50 feet with 2 turns activity    Assist    Wheelchair 50 feet with 2 turns activity did not occur: Safety/medical concerns   Assist Level: Supervision/Verbal cueing   Wheelchair 150 feet activity     Assist  Wheelchair 150 feet activity did not occur: Safety/medical concerns   Assist Level: Moderate Assistance - Patient 50 - 74%   Blood pressure (!) 107/58, pulse 81, temperature 97.8 F (36.6 C), temperature source Oral, resp. rate 18, height 5\' 1"  (1.549 m), weight 52.5 kg, SpO2 100%.  Medical Problem List and Plan: 1. 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 Awaiting approval of disability. Placement pending. Hoping that hospital will  provide LOG for placement. Code status  DNR -Continue CIR therapies including PT, OT, and SLP   Team conference 12/4  2.  Impaired mobility: continue Lovenox 30mg  daily, weekly creatinine ordered             -antiplatelet therapy: N/A  3. Pain:  -Knee pain: Tylenol as needed, continue voltaren gel scheduled. XR reviewed and is stable, discussed with therapy -Right wrist swelling: XR ordered and shows no acute fracture, shows 2.69mm ulnar variance, brace removed to given patient a break from it. Voltaren gel ordered prn for discomfort, continue, CT ordered and show no acute osseus injury, continue ace wrap   -Foot pain: tramadol ordered prn, improved today, continue prn  -Headaches:continue tylenol and tramadol prn -Low back pain: continue kpad, asked [place] to please bring for her -Whole body hypersensitivity: gabapentin 100mg  TID ordered -Pain in left heel: XR obtained and negative, cam boot  ordered to help offload heel with therapy, discussed that this has helped -R shoulder pain: XR neg for acute etiology on 04/06/23, voltaren gel ordered--encourage ROM as tolerated  4. History of anxiety: discussed with her mom that she feels this was a big risk factor for her accident, propanolol d/ced due to hypotension. Continue magnesium supplement  5. Neuropsych/cognition: This patient is not capable of making decisions on her own behalf  - 10/12: Still not capable of medical decision making; BUT oriented and communicating, following directions - MUCH improved - PRN melatonin 3mg    for insomnia  6. Skin: -Buttock/sacrum--foam dressing, pressure relief, nutrition -stage 3 left heel, continue foam dressing/ pressure relief  7. Fluids/Electrolytes/Nutrition: Routine in and outs with follow-up chemistries  -Eating  well with cueing   -Dysphagia: continue D3/thins--tolerating this diet -Low protein stores: d/c prosource since patient hates these and is eating 100% of meals -Suboptimal vitamin D  level: increase to 5,000 U daily. continue this dose  -History of magnesium deficiency: magnesium 500mg  stopped to support BP  -12/17 good po intake 8.  Cavitary right lower lobe pneumonia likely aspiration pneumonia/MRSA pneumonia.  Resolved    Latest Ref Rng & Units 05/02/2023    5:20 AM 04/18/2023    5:16 AM 04/04/2023    6:09 AM  CBC  WBC 4.0 - 10.5 K/uL 5.9  6.0  5.6   Hemoglobin 12.0 - 15.0 g/dL 29.5  28.4  13.2   Hematocrit 36.0 - 46.0 % 43.0  41.6  42.1   Platelets 150 - 400 K/uL 241  194  224      9.  History of drug abuse.  Positive cocaine on urine drug screen.  Provide counseling  10.  AKI/hypovolemia and ATN.  Resolved     Latest Ref Rng & Units 05/02/2023    5:20 AM 04/18/2023    5:16 AM 04/04/2023    6:09 AM  BMP  Glucose 70 - 99 mg/dL 89  79  91   BUN 6 - 20 mg/dL 14  17  20    Creatinine 0.44 - 1.00 mg/dL 4.40  1.02  7.25   Sodium 135 - 145 mmol/L 138  137  138   Potassium 3.5 - 5.1 mmol/L 4.0  3.8  3.9   Chloride 98 - 111 mmol/L 109  105  105   CO2 22 - 32 mmol/L 23  23  24    Calcium 8.9 - 10.3 mg/dL 8.9  9.0  9.3     11.  Mild transaminitis with rhabdomyolysis.  Both resolved  12.  Hypotension:  -04/29/23 BPs still soft end of normal but seems to be her baseline; monitor Vitals:   04/30/23 2240 04/30/23 2254 05/01/23 0408 05/01/23 1307  BP: (!) 88/61 115/72 110/72 102/71   05/01/23 2032 05/02/23 0531 05/02/23 1349 05/02/23 2100  BP: 98/65 108/75 99/69 105/69   05/03/23 0428 05/03/23 1356 05/03/23 1929 05/04/23 0353  BP: 105/71 101/72 95/62 (!) 107/58     13. HTN: resolved   14. Impaired initiation: continue amantadine 100mg  BID  15. Hyperglycemia: CBGs mildly elevated, d/ced checks. Prosource d/ced as per patient's request  16. Spasticity:  Continue wrist, elbow brace, and PRAFOs. Ordered a night splint for LLE  D/c baclofen and tizanidine- much improved  17.  Dyskinetic movements in head and neck - appears to be myoclonic jerks related to  hypoxic brain injury - as discussed with pt mom, no specific meds for this on antispasticity meds , consioder benzo although with  pt's SUD would avoid this class - EEG reviewed and was negative for seizure.  -  resolved   18. Cervical extensor weakness: decrease tizanidine to 2mg  HS, decrease baclofen to 5mg  QHS, improved, continue this dose  19. Bowel and bladder incontinence: continue bowel and bladder program. D/c miralax, decrease tizanidine to 2mg  HS, continue this dose  20. Fatigue: resolved, discussed with team may be secondary to anti-spasticity medications, B12 reviewed and is 335, in suboptimal range, will give 1,000U B12 injection 8/7, will order monthly as needed, metanx started, increase to BID, continue, d/c tizanidine, discussed with patient, d/c baclofen   21. MRSA nares positive: negative on repeat, precautions d/ced  22. Tachycardic: resolved, d/c propanolol. Continue tizanidine 2mg  HS. Resolved, amantadine increased back to 100mg  BID, continue this dose, resolved  23. S/p fall: CT head reviewed and is stable  24. Bilateral foot drop: bilateral AFOs ordered, discussed that these can help her to walk better  25. Vaginal itching: completed fluconazole. Having some discharge now. See below  26. >100k E Coli UTI  -11/28 sensitive to amoxil, completed 1 week course  27. Poor sitting balance: continue OT/PT, improving  28. Headaches: topamax 73m daily prn ordered, continue  29. Drowsiness with tramadol: d/ced  30. IUD in place? Pt states she has IUD x57yrs  and believes it's supposed to be removed soon(chart review- Dr. Seymour Bars placed Mirena IUD on 07/01/19, good for 28yrs); needs outpatient follow-up with OB/GYN  31. Constipation: d/c baclofen, last BM 12/18, milk of magnesia ordered 12/20  32. History of drug addiction: tramadol d/ced as per mom's preference      LOS: 167 days A FACE TO FACE EVALUATION WAS PERFORMED  Nicole [name] 05/04/2023, 12:14 PM

## 2023-05-04 NOTE — Plan of Care (Signed)
  Problem: Consults Goal: RH STROKE PATIENT EDUCATION Description: See Patient Education module for education specifics  Outcome: Progressing   Problem: RH BLADDER ELIMINATION Goal: RH STG MANAGE BLADDER WITH ASSISTANCE Description: STG Manage Bladder With toileting Assistance Outcome: Progressing   Problem: RH SKIN INTEGRITY Goal: RH STG MAINTAIN SKIN INTEGRITY WITH ASSISTANCE Description: STG Maintain Skin Integrity With min  Assistance. Outcome: Progressing

## 2023-05-04 NOTE — Progress Notes (Signed)
Occupational Therapy Session Note  Patient Details  Name: Nicole [name] MRN: [id number] Date of Birth: 12/22/1975  {CHL IP REHAB OT TIME CALCULATIONS:[id number]}   Short Term Goals: Week 3:  OT Short Term Goal 1 (Week 3): Patient will trial timed toileting with mod cueing during the day OT Short Term Goal 1 - Progress (Week 3): Other (comment) OT Short Term Goal 2 (Week 3): Patient will don left sock with min assist OT Short Term Goal 2 - Progress (Week 3): Other (comment) OT Short Term Goal 3 (Week 3): Patient will put hair in ponytail using BUE and min assist OT Short Term Goal 3 - Progress (Week 3): Other (comment) OT Short Term Goal 4 (Week 3): xxx OT Short Term Goal 4 - Progress (Week 3): Other (comment) OT Short Term Goal 5 (Week 3): xxx OT Short Term Goal 5 - Progress (Week 3): Other (comment)  Skilled Therapeutic Interventions/Progress Updates:  Pt greeted *** for skilled OT session with focus on ***.   Pain: Pt reported ***/10 pain, stating "***" in reference to ***. OT offering intermediate rest breaks and positioning suggestions throughout session to address pain/fatigue and maximize participation/safety in session.   Functional Transfers:  Self Care Tasks:  Therapeutic Activities:  Therapeutic Exercise:   Education:  Pt remained *** with 4Ps assessed and immediate needs met. Pt continues to be appropriate for skilled OT intervention to promote further functional independence in ADLs/IADLs.    Therapy Documentation Precautions:  Precautions Precautions: Fall Precaution Comments: decreased stand balance- reliant on UE, Pain in LLE limits mobility at times Required Braces or Orthoses: Other Brace Other Brace: B adjustable night splints; B elbow "cosey" splints; L palm protector; R mitt Restrictions Weight Bearing Restrictions Per Provider Order: No   Therapy/Group: Individual Therapy  Lou Cal, OTR/L, MSOT  05/04/2023, 8:11 PM

## 2023-05-05 NOTE — Progress Notes (Signed)
Physical Therapy Session Note  Patient Details  Name: Nicole [name] MRN: [id number] Date of Birth: [birth year]  Today's Date: 05/05/2023 PT Individual Time: 0905-1004 PT Individual Time Calculation (min): 59 min   Short Term Goals: Week 1:  PT Short Term Goal 1 (Week 1): Pt will complete bed mobility with CGA PT Short Term Goal 1 - Progress (Week 1): Not met PT Short Term Goal 2 (Week 1): Pt will ambulate 163ft with CGA and LRAD PT Short Term Goal 2 - Progress (Week 1): Not met PT Short Term Goal 3 (Week 1): Pt will complete functional outcome measure to assess falls risk PT Short Term Goal 3 - Progress (Week 1): Not met  Skilled Therapeutic Interventions/Progress Updates: Patient sitting in [facility] on entrance to room. Patient alert and agreeable to PT session.   Patient reported no pain during session. Pt ready to get out of WC and walk/work on balance today.  Therapeutic Activity: Transfers: Pt performed sit<>stand transfers throughout session with supervision/CGA for safety and with RW. Pt demos safe technique with reaching back to control descent, and pushing with one UE vs B UE on RW.  Gait Training:  Pt ambulated 170' around nsg station and day room loop using RW with CGA. Pt able to recall what has been previously addressed with cuing (extending through hips, looking ahead, and increasing step length on L LE - did require min cue to maintain with slight lean to R).  Neuromuscular Re-ed: NMR facilitated during session with focus on dynamic standing balance, coordination, and proprioceptive feedback - Hi perch quadruped on hi/low mat. Pt with CGA and walked B UE's on mat forward and back x 5. Pt stated slight discomfort in low back. Pt cued to brace core musculature with decreased rounded back (pt reported it helped).  - Pt with 5lb ankle weight donned on L LE stepping to 4" step with L HHA (min/heavy minA). Pt cued to avoid sliding L foot on step when stepping down. Pt performed until  ability to avoid sliding decreased. - Pt tossing small basketball to basketball net while standing. Pt with basketball goal set to 3rd setting from top about 5 feet away. Pt with close supervision for static standing prior to shooting, and minA to maintain dynamic standing balance for safety. Pt ambulated short distance without AD and L HHA to pick up ball from floor (one time with mod/heavy modA for safety) and ambulated back to WC. Pt with maxA to pivot to sit due to B LE's crossing, and pt not assessing placement prior to advancing (educated further on this matter after sitting in WC).  - Pt dribbled small basketball in R UE (occasionally using B UE's) while standing with VC to extend through hips and B knees. Pt with min/light modA. Pt with multiple seated rests for PTA to get ball if it rolls away, but pt increased ability to dribble 2-3 times without issue, shoot, and could make several baskets.  NMR performed for improvements in motor control and coordination, balance, sequencing, judgement, and self confidence/ efficacy in performing all aspects of mobility at highest level of independence.   Patient sitting in WC at end of session with brakes locked, belt alarm set, and all needs within reach.      Therapy Documentation Precautions:  Precautions Precautions: Fall Precaution Comments: decreased stand balance- reliant on UE, Pain in LLE limits mobility at times Required Braces or Orthoses: Other Brace Other Brace: B adjustable night splints; B elbow "cosey" splints; L  palm protector; R mitt Restrictions Weight Bearing Restrictions Per Provider Order: No  Therapy/Group: Individual Therapy  Silvester Reierson PTA 05/05/2023, 12:51 PM

## 2023-05-05 NOTE — Progress Notes (Signed)
PROGRESS NOTE   Subjective/Complaints:  Pt doing well, slept well, pain well controlled, LBM yesterday, urinating fine. Denies any other complaints or concerns today.   ROS: Patient denies fever, rash, sore throat, blurred vision, dizziness, nausea, vomiting, diarrhea, cough, shortness of breath or chest pain, joint or back/neck pain, headache, or mood change.     Objective:   No results found.  No results for input(s): "WBC", "HGB", "HCT", "PLT" in the last 72 hours.        No results for input(s): "NA", "K", "CL", "CO2", "GLUCOSE", "BUN", "CREATININE", "CALCIUM" in the last 72 hours.         Intake/Output Summary (Last 24 hours) at 05/05/2023 1152 Last data filed at 05/05/2023 1006 Gross per 24 hour  Intake 2690 ml  Output --  Net 2690 ml           Physical Exam: Vital Signs Blood pressure 102/67, pulse 71, temperature 98.3 F (36.8 C), resp. rate 18, height 5\' 1"  (1.549 m), weight 52.5 kg, SpO2 98%.   General: No acute distress, sitting in w/c HEENT: NCAT, EOMI, oral membranes moist Cards: reg rate and rhythm, no m/r/g appreciated Chest: normal effort, CTAB Abdomen: Soft, NT, ND, +BS throughout Skin: dry, intact Extremities: no edema Psych: pleasant and appropriate   Skin: + Scaling dried skin on bilateral heels left buttock unstageable - not examined + Left heel stage III DTI has resolved Neuro: memory seeming to improve lately  PRIOR EXAMS: Neurologic: AAOx2, fair insight and awareness..  + Hypophonation, speech intelligability much improved. Fair insight. MAS 1 right elbow,MAS  tr to 1 in RIght finger and wrist flexors  MAS 1 in extensors of LE's MAS 1 L elbow , MAS 1+ in L wrist and finger flexors. Hypersensitivity to left hand improved, Left sided strength improved, decreased range of motion and pain at end range of motion in left shoulder- stable 12/18  Musculoskeletal: limited  bilateral shoulder and elbow ROM due to increased tone ,right shoulder pain with PROM--stable 12/20     Assessment/Plan: 1. Functional deficits which require 3+ hours per day of interdisciplinary therapy in a comprehensive inpatient rehab setting. Physiatrist is providing close team supervision and 24 hour management of active medical problems listed below. Physiatrist and rehab team continue to assess barriers to discharge/monitor patient progress toward functional and medical goals  Care Tool:  Bathing    Body parts bathed by patient: Face, Abdomen, Chest, Left upper leg, Right upper leg, Left arm, Front perineal area, Right arm, Right lower leg, Left lower leg, Buttocks   Body parts bathed by helper: Buttocks Body parts n/a: Right arm, Left arm, Front perineal area, Buttocks, Right upper leg, Left upper leg, Right lower leg, Left lower leg   Bathing assist Assist Level: Contact Guard/Touching assist     Upper Body Dressing/Undressing Upper body dressing   What is the patient wearing?: Pull over shirt    Upper body assist Assist Level: Set up assist    Lower Body Dressing/Undressing Lower body dressing      What is the patient wearing?: Incontinence brief, Pants     Lower body assist Assist for lower body dressing: Minimal Assistance -  Patient > 75%     Toileting Toileting Toileting Activity did not occur Press photographer and hygiene only): N/A (no void or bm)  Toileting assist Assist for toileting: Minimal Assistance - Patient > 75%     Transfers Chair/bed transfer  Transfers assist  Chair/bed transfer activity did not occur: Safety/medical concerns  Chair/bed transfer assist level: Contact Guard/Touching assist     Locomotion Ambulation   Ambulation assist   Ambulation activity did not occur: Safety/medical concerns  Assist level: Contact Guard/Touching assist Assistive device: Walker-rolling Max distance: 150   Walk 10 feet  activity   Assist  Walk 10 feet activity did not occur: Safety/medical concerns  Assist level: Contact Guard/Touching assist Assistive device: Walker-rolling   Walk 50 feet activity   Assist Walk 50 feet with 2 turns activity did not occur: Safety/medical concerns  Assist level: Contact Guard/Touching assist Assistive device: Walker-rolling    Walk 150 feet activity   Assist Walk 150 feet activity did not occur: Safety/medical concerns  Assist level: Contact Guard/Touching assist Assistive device: Walker-rolling    Walk 10 feet on uneven surface  activity   Assist Walk 10 feet on uneven surfaces activity did not occur: Safety/medical concerns   Assist level: Minimal Assistance - Patient > 75% Assistive device: Walker-rolling   Wheelchair     Assist Is the patient using a wheelchair?: Yes Type of Wheelchair: Manual    Wheelchair assist level: Moderate Assistance - Patient 50 - 74% Max wheelchair distance: 56ft    Wheelchair 50 feet with 2 turns activity    Assist    Wheelchair 50 feet with 2 turns activity did not occur: Safety/medical concerns   Assist Level: Supervision/Verbal cueing   Wheelchair 150 feet activity     Assist  Wheelchair 150 feet activity did not occur: Safety/medical concerns   Assist Level: Moderate Assistance - Patient 50 - 74%   Blood pressure 102/67, pulse 71, temperature 98.3 F (36.8 C), resp. rate 18, height 5\' 1"  (1.549 m), weight 52.5 kg, SpO2 98%.  Medical Problem List and Plan: 1. 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 Awaiting approval of disability. Placement pending.  Hoping that hospital will provide LOG for placement. Code status  DNR -Continue CIR therapies including PT, OT, and SLP   Team conference 12/4  2.  Impaired mobility: continue Lovenox 30mg  daily, weekly creatinine ordered             -antiplatelet therapy: N/A  3. Pain:  -Knee pain: Tylenol as needed, continue voltaren gel scheduled. XR reviewed and is stable, discussed with therapy -Right wrist swelling: XR ordered and shows no acute fracture, shows 2.38mm ulnar variance, brace removed to given patient a break from it. Voltaren gel ordered prn for discomfort, continue, CT ordered and show no acute osseus injury, continue ace wrap   -Foot pain: tramadol ordered prn, improved today, continue prn  -Headaches:continue tylenol and tramadol prn -Low back pain: continue kpad, asked [place] to please bring for her -Whole body hypersensitivity: gabapentin 100mg  TID ordered -Pain in left heel: XR obtained and negative, cam boot ordered to help offload heel with therapy, discussed that this has helped -R shoulder pain:  XR neg for acute etiology on 04/06/23, voltaren gel ordered--encourage ROM as tolerated  4. History of anxiety: discussed with her mom that she feels this was a big risk factor for her accident, propanolol d/ced due to hypotension. Continue magnesium supplement  5. Neuropsych/cognition: This patient is not capable of making decisions on her own behalf  - 10/12: Still not capable of medical decision making; BUT oriented and communicating, following directions - MUCH improved - PRN melatonin 3mg    for insomnia  6. Skin: -Buttock/sacrum--foam dressing, pressure relief, nutrition -stage 3 left heel, continue foam dressing/ pressure relief  7. Fluids/Electrolytes/Nutrition: Routine in and outs with follow-up chemistries  -Eating  well with cueing   -Dysphagia: continue D3/thins--tolerating this diet -Low protein stores: d/c prosource since patient hates these and is eating 100% of  meals -Suboptimal vitamin D level: increase to 5,000 U daily. continue this dose  -History of magnesium deficiency: magnesium 500mg  stopped to support BP  -12/17 good po intake 8.  Cavitary right lower lobe pneumonia likely aspiration pneumonia/MRSA pneumonia.  Resolved    Latest Ref Rng & Units 05/02/2023    5:20 AM 04/18/2023    5:16 AM 04/04/2023    6:09 AM  CBC  WBC 4.0 - 10.5 K/uL 5.9  6.0  5.6   Hemoglobin 12.0 - 15.0 g/dL 40.9  81.1  91.4   Hematocrit 36.0 - 46.0 % 43.0  41.6  42.1   Platelets 150 - 400 K/uL 241  194  224      9.  History of drug abuse.  Positive cocaine on urine drug screen.  Provide counseling  10.  AKI/hypovolemia and ATN.  Resolved     Latest Ref Rng & Units 05/02/2023    5:20 AM 04/18/2023    5:16 AM 04/04/2023    6:09 AM  BMP  Glucose 70 - 99 mg/dL 89  79  91   BUN 6 - 20 mg/dL 14  17  20    Creatinine 0.44 - 1.00 mg/dL 7.82  9.56  2.13   Sodium 135 - 145 mmol/L 138  137  138   Potassium 3.5 - 5.1 mmol/L 4.0  3.8  3.9   Chloride 98 - 111 mmol/L 109  105  105   CO2 22 - 32 mmol/L 23  23  24    Calcium 8.9 - 10.3 mg/dL 8.9  9.0  9.3     11.  Mild transaminitis with rhabdomyolysis.  Both resolved  12.  Hypotension:  -05/05/23 BPs still soft end of normal but seems to be her baseline; monitor Vitals:   05/01/23 0408 05/01/23 1307 05/01/23 2032 05/02/23 0531  BP: 110/72 102/71 98/65 108/75   05/02/23 1349 05/02/23 2100 05/03/23 0428 05/03/23 1356  BP: 99/69 105/69 105/71 101/72   05/03/23 1929 05/04/23 0353 05/04/23 2112 05/05/23 0629  BP: 95/62 (!) 107/58 102/66 102/67     13. HTN: resolved   14. Impaired initiation: continue amantadine 100mg  BID>> decreased to QD  15. Hyperglycemia: CBGs mildly elevated, d/ced checks. Prosource d/ced as per patient's request  16. Spasticity:  Continue wrist, elbow brace, and PRAFOs. Ordered a night splint for LLE  D/c baclofen and tizanidine- much improved  17.  Dyskinetic movements in head and neck  - appears to be myoclonic jerks related to hypoxic brain injury - as discussed with pt mom, no specific meds for this on antispasticity meds , consioder benzo although with pt's SUD would avoid this class - EEG reviewed and was negative for  seizure.  -  resolved   18. Cervical extensor weakness: decrease tizanidine to 2mg  HS, decrease baclofen to 5mg  QHS, improved, d/c'd  19. Bowel and bladder incontinence: continue bowel and bladder program. D/c miralax, decrease tizanidine to 2mg  HS, d/c'd  20. Fatigue: resolved, discussed with team may be secondary to anti-spasticity medications, B12 reviewed and is 335, in suboptimal range, will give 1,000U B12 injection 8/7, will order monthly as needed, metanx started, increase to BID, continue, d/c tizanidine, discussed with patient, d/c baclofen   21. MRSA nares positive: negative on repeat, precautions d/ced  22. Tachycardic: resolved, d/c propanolol. Continue tizanidine 2mg  HS. Resolved, amantadine increased back to 100mg  BID, continue this dose, resolved  23. S/p fall: CT head reviewed and is stable  24. Bilateral foot drop: bilateral AFOs ordered, discussed that these can help her to walk better  25. Vaginal itching: completed fluconazole. Having some discharge now. See below  26. >100k E Coli UTI: 11/28 sensitive to amoxil, completed 1 week course  27. Poor sitting balance: continue OT/PT, improving  28. Headaches: topamax 39m daily prn ordered, continue  29. Drowsiness with tramadol: d/ced  30. IUD in place? Pt states she has IUD x43yrs  and believes it's supposed to be removed soon(chart review- Dr. Seymour Bars placed Mirena IUD on 07/01/19, good for 3yrs); needs outpatient follow-up with OB/GYN  31. Constipation: d/c baclofen, last BM 12/18, milk of magnesia ordered 12/20, LBM 05/04/23  32. History of drug addiction: tramadol d/ced as per mom's preference      LOS: 168 days A FACE TO FACE EVALUATION WAS PERFORMED  [address] 05/05/2023, 11:52 AM

## 2023-05-05 NOTE — Progress Notes (Signed)
Occupational Therapy Session Note  Patient Details  Name: Nicole [name] MRN: [id number] Date of Birth: 1975-07-08  Today's Date: 05/05/2023 OT Individual Time: 1324-4010 OT Individual Time Calculation (min): 44 min     Skilled Therapeutic Interventions/Progress Updates: Patient received finishing lunch in her room. Agreeable to OT treatment. Patient wanting to work on dynamic balance activities standing, stood at raised table top 2 x 8 minutes for fine motor game working on attention and coordination.  Stood with SBA and cues for w/c safety. Followed standing tasks with UE dowel therEx working on improved ROM in shoulders and proximal strength. Performed sets of 10 in varied planes with cues for task performance to be slow and controlled. Patient with significant limitations to L shoulder ROM and reports discomfort at end ranges. Continued treatment with fine motor coordination and attention paper activities. Patient able to follow direction for 1 and 2 step tasks. Continue with skilled OT POC working towards improved safety and independence with self care activities.      Therapy Documentation Precautions:  Precautions Precautions: Fall Precaution Comments: decreased stand balance- reliant on UE, Pain in LLE limits mobility at times Required Braces or Orthoses: Other Brace Other Brace: B adjustable night splints; B elbow "cosey" splints; L palm protector; R mitt Restrictions Weight Bearing Restrictions Per Provider Order: No    Pain:Reports stiffness with BUE reach   ADL: ADL Eating: Maximal assistance Where Assessed-Eating: Chair Grooming: Dependent Where Assessed-Grooming: Bed level Upper Body Bathing: Dependent Where Assessed-Upper Body Bathing: Bed level Lower Body Bathing: Dependent Where Assessed-Lower Body Bathing: Bed level Upper Body Dressing: Dependent Where Assessed-Upper Body Dressing: Bed level Lower Body Dressing: Dependent Where Assessed-Lower Body Dressing: Bed  level Toileting: Dependent Where Assessed-Toileting: Bed level Toilet Transfer: Unable to assess Psychologist, counselling Transfer: Unable to assess    Therapy/Group: Individual Therapy  Warnell Forester 05/05/2023, 4:10 PM

## 2023-05-06 NOTE — Progress Notes (Signed)
PROGRESS NOTE   Subjective/Complaints:  Pt doing well again today, slept well, denies pain today, LBM yesterday, urinating fine. Denies any other complaints or concerns today.   ROS: Patient denies fever, rash, sore throat, blurred vision, dizziness, nausea, vomiting, diarrhea, cough, shortness of breath or chest pain, joint or back/neck pain, headache, or mood change.     Objective:   No results found.  No results for input(s): "WBC", "HGB", "HCT", "PLT" in the last 72 hours.        No results for input(s): "NA", "K", "CL", "CO2", "GLUCOSE", "BUN", "CREATININE", "CALCIUM" in the last 72 hours.         Intake/Output Summary (Last 24 hours) at 05/06/2023 1146 Last data filed at 05/06/2023 0109 Gross per 24 hour  Intake 720 ml  Output --  Net 720 ml           Physical Exam: Vital Signs Blood pressure 107/66, pulse 80, temperature 98 F (36.7 C), temperature source Oral, resp. rate 16, height 5\' 1"  (1.549 m), weight 52.5 kg, SpO2 98%.   General: No acute distress, sitting in w/c writing on a poster/large paper HEENT: NCAT, EOMI, oral membranes moist Cards: reg rate and rhythm, no m/r/g appreciated Chest: normal effort, CTAB Abdomen: Soft, NT, ND, +BS throughout Skin: dry, intact Extremities: no edema Psych: pleasant and appropriate   Skin: + Scaling dried skin on bilateral heels left buttock unstageable - not examined + Left heel stage III DTI has resolved Neuro: memory seeming to improve lately  PRIOR EXAMS: Neurologic: AAOx2, fair insight and awareness..  + Hypophonation, speech intelligability much improved. Fair insight. MAS 1 right elbow,MAS  tr to 1 in RIght finger and wrist flexors  MAS 1 in extensors of LE's MAS 1 L elbow , MAS 1+ in L wrist and finger flexors. Hypersensitivity to left hand improved, Left sided strength improved, decreased range of motion and pain at end range of motion  in left shoulder- stable 12/18  Musculoskeletal: limited bilateral shoulder and elbow ROM due to increased tone ,right shoulder pain with PROM--stable 12/20     Assessment/Plan: 1. Functional deficits which require 3+ hours per day of interdisciplinary therapy in a comprehensive inpatient rehab setting. Physiatrist is providing close team supervision and 24 hour management of active medical problems listed below. Physiatrist and rehab team continue to assess barriers to discharge/monitor patient progress toward functional and medical goals  Care Tool:  Bathing    Body parts bathed by patient: Face, Abdomen, Chest, Left upper leg, Right upper leg, Left arm, Front perineal area, Right arm, Right lower leg, Left lower leg, Buttocks   Body parts bathed by helper: Buttocks Body parts n/a: Right arm, Left arm, Front perineal area, Buttocks, Right upper leg, Left upper leg, Right lower leg, Left lower leg   Bathing assist Assist Level: Contact Guard/Touching assist     Upper Body Dressing/Undressing Upper body dressing   What is the patient wearing?: Pull over shirt    Upper body assist Assist Level: Set up assist    Lower Body Dressing/Undressing Lower body dressing      What is the patient wearing?: Incontinence brief, Pants  Lower body assist Assist for lower body dressing: Minimal Assistance - Patient > 75%     Toileting Toileting Toileting Activity did not occur Press photographer and hygiene only): N/A (no void or bm)  Toileting assist Assist for toileting: Minimal Assistance - Patient > 75%     Transfers Chair/bed transfer  Transfers assist  Chair/bed transfer activity did not occur: Safety/medical concerns  Chair/bed transfer assist level: Contact Guard/Touching assist     Locomotion Ambulation   Ambulation assist   Ambulation activity did not occur: Safety/medical concerns  Assist level: Contact Guard/Touching assist Assistive device:  Walker-rolling Max distance: 150   Walk 10 feet activity   Assist  Walk 10 feet activity did not occur: Safety/medical concerns  Assist level: Contact Guard/Touching assist Assistive device: Walker-rolling   Walk 50 feet activity   Assist Walk 50 feet with 2 turns activity did not occur: Safety/medical concerns  Assist level: Contact Guard/Touching assist Assistive device: Walker-rolling    Walk 150 feet activity   Assist Walk 150 feet activity did not occur: Safety/medical concerns  Assist level: Contact Guard/Touching assist Assistive device: Walker-rolling    Walk 10 feet on uneven surface  activity   Assist Walk 10 feet on uneven surfaces activity did not occur: Safety/medical concerns   Assist level: Minimal Assistance - Patient > 75% Assistive device: Walker-rolling   Wheelchair     Assist Is the patient using a wheelchair?: Yes Type of Wheelchair: Manual    Wheelchair assist level: Moderate Assistance - Patient 50 - 74% Max wheelchair distance: 20ft    Wheelchair 50 feet with 2 turns activity    Assist    Wheelchair 50 feet with 2 turns activity did not occur: Safety/medical concerns   Assist Level: Supervision/Verbal cueing   Wheelchair 150 feet activity     Assist  Wheelchair 150 feet activity did not occur: Safety/medical concerns   Assist Level: Moderate Assistance - Patient 50 - 74%   Blood pressure 107/66, pulse 80, temperature 98 F (36.7 C), temperature source Oral, resp. rate 16, height 5\' 1"  (1.549 m), weight 52.5 kg, SpO2 98%.  Medical Problem List and Plan: 1. 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 Awaiting approval of disability. Placement pending. Hoping that hospital will provide LOG for placement. Code status  DNR -Continue CIR therapies including PT, OT, and SLP   Team conference 12/4  2.  Impaired mobility: continue Lovenox 30mg  daily, weekly creatinine ordered             -antiplatelet therapy: N/A  3. Pain:  -Knee pain: Tylenol as needed, continue voltaren gel scheduled. XR reviewed and is stable, discussed with therapy -Right wrist swelling: XR ordered and shows no acute fracture, shows 2.3mm ulnar variance, brace removed to given patient a break from it. Voltaren gel ordered prn for discomfort, continue, CT ordered and show no acute osseus injury, continue ace wrap   -Foot pain: tramadol ordered prn, improved today, continue prn  -Headaches:continue tylenol and tramadol prn -Low back pain: continue kpad, asked [place] to please bring for her -Whole body hypersensitivity: gabapentin 100mg  TID ordered -Pain in left heel: XR obtained and negative, cam boot ordered  to help offload heel with therapy, discussed that this has helped -R shoulder pain: XR neg for acute etiology on 04/06/23, voltaren gel ordered--encourage ROM as tolerated  4. History of anxiety: discussed with her mom that she feels this was a big risk factor for her accident, propanolol d/ced due to hypotension. Continue magnesium supplement  5. Neuropsych/cognition: This patient is not capable of making decisions on her own behalf  - 10/12: Still not capable of medical decision making; BUT oriented and communicating, following directions - MUCH improved - PRN melatonin 3mg    for insomnia  6. Skin: -Buttock/sacrum--foam dressing, pressure relief, nutrition -stage 3 left heel, continue foam dressing/ pressure relief  7. Fluids/Electrolytes/Nutrition: Routine in and outs with follow-up chemistries  -Eating  well with cueing   -Dysphagia: continue D3/thins--tolerating this diet -Low protein  stores: d/c prosource since patient hates these and is eating 100% of meals -Suboptimal vitamin D level: increase to 5,000 U daily. continue this dose  -History of magnesium deficiency: magnesium 500mg  stopped to support BP  -12/17 good po intake 8.  Cavitary right lower lobe pneumonia likely aspiration pneumonia/MRSA pneumonia.  Resolved    Latest Ref Rng & Units 05/02/2023    5:20 AM 04/18/2023    5:16 AM 04/04/2023    6:09 AM  CBC  WBC 4.0 - 10.5 K/uL 5.9  6.0  5.6   Hemoglobin 12.0 - 15.0 g/dL 59.5  63.8  75.6   Hematocrit 36.0 - 46.0 % 43.0  41.6  42.1   Platelets 150 - 400 K/uL 241  194  224      9.  History of drug abuse.  Positive cocaine on urine drug screen.  Provide counseling  10.  AKI/hypovolemia and ATN.  Resolved     Latest Ref Rng & Units 05/02/2023    5:20 AM 04/18/2023    5:16 AM 04/04/2023    6:09 AM  BMP  Glucose 70 - 99 mg/dL 89  79  91   BUN 6 - 20 mg/dL 14  17  20    Creatinine 0.44 - 1.00 mg/dL 4.33  2.95  1.88   Sodium 135 - 145 mmol/L 138  137  138   Potassium 3.5 - 5.1 mmol/L 4.0  3.8  3.9   Chloride 98 - 111 mmol/L 109  105  105   CO2 22 - 32 mmol/L 23  23  24    Calcium 8.9 - 10.3 mg/dL 8.9  9.0  9.3     11.  Mild transaminitis with rhabdomyolysis.  Both resolved  12.  Hypotension:  -12/21-22/24 BPs still soft end of normal but seems to be her baseline; monitor Vitals:   05/02/23 0531 05/02/23 1349 05/02/23 2100 05/03/23 0428  BP: 108/75 99/69 105/69 105/71   05/03/23 1356 05/03/23 1929 05/04/23 0353 05/04/23 2112  BP: 101/72 95/62 (!) 107/58 102/66   05/05/23 0629 05/05/23 1353 05/05/23 2100 05/06/23 0508  BP: 102/67 94/67 104/68 107/66     13. HTN: resolved   14. Impaired initiation: continue amantadine 100mg  BID>> decreased to QD  15. Hyperglycemia: CBGs mildly elevated, d/ced checks. Prosource d/ced as per patient's request  16. Spasticity:  Continue wrist, elbow brace, and PRAFOs. Ordered a night splint for LLE  D/c baclofen and  tizanidine- much improved  17.  Dyskinetic movements in head and neck - appears to be myoclonic jerks related to hypoxic brain injury - as discussed with pt mom, no specific meds for this on antispasticity meds , consioder benzo although  with pt's SUD would avoid this class - EEG reviewed and was negative for seizure.  -  resolved   18. Cervical extensor weakness: decrease tizanidine to 2mg  HS, decrease baclofen to 5mg  QHS, improved, d/c'd  19. Bowel and bladder incontinence: continue bowel and bladder program. D/c miralax, decrease tizanidine to 2mg  HS, d/c'd  20. Fatigue: resolved, discussed with team may be secondary to anti-spasticity medications, B12 reviewed and is 335, in suboptimal range, will give 1,000U B12 injection 8/7, will order monthly as needed, metanx started, increase to BID, continue, d/c tizanidine, discussed with patient, d/c baclofen   21. MRSA nares positive: negative on repeat, precautions d/ced  22. Tachycardic: resolved, d/c propanolol. Continue tizanidine 2mg  HS. Resolved, amantadine increased back to 100mg  BID, continue this dose, resolved  23. S/p fall: CT head reviewed and is stable  24. Bilateral foot drop: bilateral AFOs ordered, discussed that these can help her to walk better  25. Vaginal itching: completed fluconazole. Having some discharge now. See below  26. >100k E Coli UTI: 11/28 sensitive to amoxil, completed 1 week course  27. Poor sitting balance: continue OT/PT, improving  28. Headaches: topamax 31m daily prn ordered, continue  29. Drowsiness with tramadol: d/ced  30. IUD in place? Pt states she has IUD x24yrs  and believes it's supposed to be removed soon(chart review- Dr. Seymour Bars placed Mirena IUD on 07/01/19, good for 46yrs); needs outpatient follow-up with OB/GYN  31. Constipation: d/c baclofen, last BM 12/18, milk of magnesia ordered 12/20, LBM 05/05/23  32. History of drug addiction: tramadol d/ced as per mom's preference       LOS: 169 days A FACE TO FACE EVALUATION WAS PERFORMED  [address] 05/06/2023, 11:46 AM

## 2023-05-07 LAB — URINALYSIS, ROUTINE W REFLEX MICROSCOPIC
Bilirubin Urine: NEGATIVE
Glucose, UA: NEGATIVE mg/dL
Hgb urine dipstick: NEGATIVE
Ketones, ur: NEGATIVE mg/dL
Leukocytes,Ua: NEGATIVE
Nitrite: NEGATIVE
Protein, ur: NEGATIVE mg/dL
Specific Gravity, Urine: 1.011 (ref 1.005–1.030)
pH: 6 (ref 5.0–8.0)

## 2023-05-07 NOTE — Progress Notes (Signed)
Occupational Therapy Session Note  Patient Details  Name: Nicole [name] MRN: [id number] Date of Birth: 11-Feb-1976  Today's Date: 05/07/2023 OT Individual Time: 4098-1191 OT Individual Time Calculation (min): 42 min    Short Term Goals: Week 3:  OT Short Term Goal 1 (Week 3): Patient will trial timed toileting with mod cueing during the day OT Short Term Goal 1 - Progress (Week 3): Other (comment) OT Short Term Goal 2 (Week 3): Patient will don left sock with min assist OT Short Term Goal 2 - Progress (Week 3): Other (comment) OT Short Term Goal 3 (Week 3): Patient will put hair in ponytail using BUE and min assist OT Short Term Goal 3 - Progress (Week 3): Other (comment) OT Short Term Goal 4 (Week 3): xxx OT Short Term Goal 4 - Progress (Week 3): Other (comment) OT Short Term Goal 5 (Week 3): xxx OT Short Term Goal 5 - Progress (Week 3): Other (comment)  Skilled Therapeutic Interventions/Progress Updates:    Patient received from Christmas sing along - and agreeable to OT session.  Transported to gym to work on shoulder/arm functioning.  Patient with both passive and active limitations in BUE: LUE>RUE.  Patient transferred to mat table with close supervision without device.  Transitioned to prone on elbows to engage scapula stabilizers - did not tolerate position long due to low back pain, transitioned to quadruped - rocking back toward heels for shoulder stretch (child's pose)  Worked on improving weight shift and stability on LUE.  Patient with improved tolerance of weight on open left hand wrist extension.  Worked on rotational components of moving into and out of quadruped.  Walked back to room walking and pushing her wheelchair.  Indicating need to void, and had continent void on toilet!  Left up in bed with alarm engaged and call bell / personal items in reach.    Therapy Documentation Precautions:  Precautions Precautions: Fall Precaution Comments: decreased stand balance- reliant  on UE, Pain in LLE limits mobility at times Required Braces or Orthoses: Other Brace Other Brace: B adjustable night splints; B elbow "cosey" splints; L palm protector; R mitt Restrictions Weight Bearing Restrictions Per Provider Order: No  Pain: Pain Assessment Pain Score: 5      Therapy/Group: Individual Therapy  [name] 05/07/2023, 12:12 PM

## 2023-05-07 NOTE — Progress Notes (Signed)
Occupational Therapy Session Note  Patient Details  Name: Nicole [name] MRN: [id number] Date of Birth: 05-02-1976  Today's Date: 05/07/2023 OT Individual Time: 8295-6213 OT Individual Time Calculation (min): 58 min    Short Term Goals: Week 3:  OT Short Term Goal 1 (Week 3): Patient will trial timed toileting with mod cueing during the day OT Short Term Goal 1 - Progress (Week 3): Other (comment) OT Short Term Goal 2 (Week 3): Patient will don left sock with min assist OT Short Term Goal 2 - Progress (Week 3): Other (comment) OT Short Term Goal 3 (Week 3): Patient will put hair in ponytail using BUE and min assist OT Short Term Goal 3 - Progress (Week 3): Other (comment) OT Short Term Goal 4 (Week 3): xxx OT Short Term Goal 4 - Progress (Week 3): Other (comment) OT Short Term Goal 5 (Week 3): xxx OT Short Term Goal 5 - Progress (Week 3): Other (comment)  Skilled Therapeutic Interventions/Progress Updates:    Patient received sitting up in bed- eating breakfast.  Patient indicates need to void, and walked to shower with close supervision following set up.  Stood fpr 75%of shower!  Patient approaching transitional movements more carefully even without cueing.  Patient continues to show improving awareness, can identify poor memory and vision changes consistently, however - when asked for functional implications - patient is challenged to respond.  Sit to stand with close supervision to contact guard during BADL.  Patient continues to show improving self organizational skills, although benefits from intermittent cueing for improved independence - especially related to care of LUE/LLE.  Patient left up in wheelchair for direct handoff to PT.    Therapy Documentation Precautions:  Precautions Precautions: Fall Precaution Comments: decreased stand balance- reliant on UE, Pain in LLE limits mobility at times Required Braces or Orthoses: Other Brace Other Brace: B adjustable night splints; B  elbow "cosey" splints; L palm protector; R mitt Restrictions Weight Bearing Restrictions Per Provider Order: No  Pain: Pain Assessment Pain Scale: 0-10 Pain Score: 5  Pain Intervention(s): Medication (See eMAR)    Therapy/Group: Individual Therapy  [name] 05/07/2023, 9:28 AM

## 2023-05-07 NOTE — Progress Notes (Signed)
Physical Therapy Session Note  Patient Details  Name: Nicole [name] MRN: [id number] Date of Birth: December 22, 1975  Today's Date: 05/07/2023 PT Individual Time: 0900-0925  PT Individual Time Calculation (min): 25 min   Short Term Goals: See Flowsheet for STG  Skilled Therapeutic Interventions/Progress Updates:   Chart reviewed and pt agreeable to therapy. Pt received seated in WC with no c/o pain. Also of note, pt received as direct handoff from OT. Session focused on high level amb to promote safe home and community access. Pt initiated session with amb of 133ft to therapy gym using CGA + RW. Pt then completed blocked practice of amb around cones. Pt noted to require strong VC for safety turning RW turns. Pt able to verbalize understanding of need to navigate turns with caution, however pt still displaying unsafe mobility at turns including increased distance from walker and kicking walker during turns. Pt amb back to room at same assist level. Session education emphasized caution with directional changes with RW. At end of session, pt was left seated in RW with alarm engaged, nurse call bell and all needs in reach.    Therapy Documentation Precautions:  Precautions Precautions: Fall Precaution Comments: decreased stand balance- reliant on UE, Pain in LLE limits mobility at times Required Braces or Orthoses: Other Brace Other Brace: B adjustable night splints; B elbow "cosey" splints; L palm protector; R mitt Restrictions Weight Bearing Restrictions Per Provider Order: No   Therapy/Group: Individual Therapy  Dionne Milo 05/07/2023, 12:06 PM

## 2023-05-07 NOTE — Progress Notes (Signed)
PROGRESS NOTE   Subjective/Complaints: Ambulating well with RW with Kira +anxiety and depression, asked SW to please get her on neuropsych schedule Asks about prognosis  ROS: Patient denies fever, rash, sore throat, blurred vision, dizziness, nausea, vomiting, diarrhea, cough, shortness of breath or chest pain, joint or back/neck pain, headache, or mood change. +anxiety and depression    Objective:   No results found.  No results for input(s): "WBC", "HGB", "HCT", "PLT" in the last 72 hours.        No results for input(s): "NA", "K", "CL", "CO2", "GLUCOSE", "BUN", "CREATININE", "CALCIUM" in the last 72 hours.         Intake/Output Summary (Last 24 hours) at 05/07/2023 1217 Last data filed at 05/07/2023 0556 Gross per 24 hour  Intake 300 ml  Output 200 ml  Net 100 ml           Physical Exam: Vital Signs Blood pressure 123/83, pulse 77, temperature 97.9 F (36.6 C), temperature source Oral, resp. rate 18, height 5\' 1"  (1.549 m), weight 52.5 kg, SpO2 100%.   General: No acute distress, sitting in w/c writing on a poster/large paper HEENT: NCAT, EOMI, oral membranes moist Cards: reg rate and rhythm, no m/r/g appreciated Chest: normal effort, CTAB Abdomen: Soft, NT, ND, +BS throughout Skin: dry, intact Extremities: no edema Psych: pleasant and appropriate   Skin: + Scaling dried skin on bilateral heels left buttock unstageable - not examined + Left heel stage III DTI has resolved Neuro: memory seeming to improve lately Neurologic: AAOx2, fair insight and awareness..  + Hypophonation, speech intelligability much improved. Fair insight. MAS 1 right elbow,MAS  tr to 1 in RIght finger and wrist flexors  MAS 1 in extensors of LE's MAS 1 L elbow , MAS 1+ in L wrist and finger flexors. Hypersensitivity to left hand improved, Left sided strength improved, decreased range of motion and pain at end range of  motion in left shoulder- stable 12/18  Musculoskeletal: limited bilateral shoulder and elbow ROM due to increased tone ,right shoulder pain with PROM--stable 12/23     Assessment/Plan: 1. Functional deficits which require 3+ hours per day of interdisciplinary therapy in a comprehensive inpatient rehab setting. Physiatrist is providing close team supervision and 24 hour management of active medical problems listed below. Physiatrist and rehab team continue to assess barriers to discharge/monitor patient progress toward functional and medical goals  Care Tool:  Bathing    Body parts bathed by patient: Face, Abdomen, Chest, Left upper leg, Right upper leg, Left arm, Front perineal area, Right arm, Right lower leg, Left lower leg, Buttocks   Body parts bathed by helper: Buttocks Body parts n/a: Right arm, Left arm, Front perineal area, Buttocks, Right upper leg, Left upper leg, Right lower leg, Left lower leg   Bathing assist Assist Level: Contact Guard/Touching assist     Upper Body Dressing/Undressing Upper body dressing   What is the patient wearing?: Pull over shirt    Upper body assist Assist Level: Set up assist    Lower Body Dressing/Undressing Lower body dressing      What is the patient wearing?: Incontinence brief, Pants     Lower body  assist Assist for lower body dressing: Minimal Assistance - Patient > 75%     Toileting Toileting Toileting Activity did not occur Press photographer and hygiene only): N/A (no void or bm)  Toileting assist Assist for toileting: Minimal Assistance - Patient > 75%     Transfers Chair/bed transfer  Transfers assist  Chair/bed transfer activity did not occur: Safety/medical concerns  Chair/bed transfer assist level: Contact Guard/Touching assist     Locomotion Ambulation   Ambulation assist   Ambulation activity did not occur: Safety/medical concerns  Assist level: Contact Guard/Touching assist Assistive device:  Walker-rolling Max distance: 150   Walk 10 feet activity   Assist  Walk 10 feet activity did not occur: Safety/medical concerns  Assist level: Contact Guard/Touching assist Assistive device: Walker-rolling   Walk 50 feet activity   Assist Walk 50 feet with 2 turns activity did not occur: Safety/medical concerns  Assist level: Contact Guard/Touching assist Assistive device: Walker-rolling    Walk 150 feet activity   Assist Walk 150 feet activity did not occur: Safety/medical concerns  Assist level: Contact Guard/Touching assist Assistive device: Walker-rolling    Walk 10 feet on uneven surface  activity   Assist Walk 10 feet on uneven surfaces activity did not occur: Safety/medical concerns   Assist level: Minimal Assistance - Patient > 75% Assistive device: Walker-rolling   Wheelchair     Assist Is the patient using a wheelchair?: Yes Type of Wheelchair: Manual    Wheelchair assist level: Moderate Assistance - Patient 50 - 74% Max wheelchair distance: 67ft    Wheelchair 50 feet with 2 turns activity    Assist    Wheelchair 50 feet with 2 turns activity did not occur: Safety/medical concerns   Assist Level: Supervision/Verbal cueing   Wheelchair 150 feet activity     Assist  Wheelchair 150 feet activity did not occur: Safety/medical concerns   Assist Level: Moderate Assistance - Patient 50 - 74%   Blood pressure 123/83, pulse 77, temperature 97.9 F (36.6 C), temperature source Oral, resp. rate 18, height 5\' 1"  (1.549 m), weight 52.5 kg, SpO2 100%.  Medical Problem List and Plan: 1. 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 Awaiting approval of disability. Placement pending. Hoping that hospital will provide LOG for placement. Code status  DNR -Continue CIR therapies including PT, OT, and SLP   Team conference 12/4  Have discussed prognosis with mom, will discuss with patient  2.  Impaired mobility: continue Lovenox 30mg  daily, weekly creatinine ordered             -antiplatelet therapy: N/A  3. Pain:  -Knee pain: Tylenol as needed, continue voltaren gel scheduled. XR reviewed and is stable, discussed with therapy -Right wrist swelling: XR ordered and shows no acute fracture, shows 2.47mm ulnar variance, brace removed to given patient a break from it. Voltaren gel ordered prn for discomfort, continue, CT ordered and show no acute osseus injury, continue ace wrap   -Foot pain: tramadol ordered prn, improved today, continue prn  -Headaches:continue tylenol and tramadol prn -Low back pain: continue kpad, asked [place] to please bring for her -Whole body hypersensitivity: gabapentin 100mg  TID ordered -Pain in left  heel: XR obtained and negative, cam boot ordered to help offload heel with therapy, discussed that this has helped -R shoulder pain: XR neg for acute etiology on 04/06/23, voltaren gel ordered--encourage ROM as tolerated  4. History of anxiety: discussed with her mom that she feels this was a big risk factor for her accident, propanolol d/ced due to hypotension. Continue magnesium supplement. Asked SW to place her on neuropsych list  5. Neuropsych/cognition: This patient is not capable of making decisions on her own behalf  - 10/12: Still not capable of medical decision making; BUT oriented and communicating, following directions - MUCH improved - PRN melatonin 3mg    for insomnia  6. Skin: -Buttock/sacrum--foam dressing, pressure relief, nutrition -stage 3 left heel, continue foam dressing/ pressure relief  7. Fluids/Electrolytes/Nutrition: Routine in and outs with  follow-up chemistries  -Eating  well with cueing   -Dysphagia: continue D3/thins--tolerating this diet -Low protein stores: d/c prosource since patient hates these and is eating 100% of meals -Suboptimal vitamin D level: increase to 5,000 U daily. continue this dose  -History of magnesium deficiency: magnesium 500mg  stopped to support BP  -12/17 good po intake 8.  Cavitary right lower lobe pneumonia likely aspiration pneumonia/MRSA pneumonia.  Resolved    Latest Ref Rng & Units 05/02/2023    5:20 AM 04/18/2023    5:16 AM 04/04/2023    6:09 AM  CBC  WBC 4.0 - 10.5 K/uL 5.9  6.0  5.6   Hemoglobin 12.0 - 15.0 g/dL 71.6  96.7  89.3   Hematocrit 36.0 - 46.0 % 43.0  41.6  42.1   Platelets 150 - 400 K/uL 241  194  224      9.  History of drug abuse.  Positive cocaine on urine drug screen.  Provide counseling  10.  AKI/hypovolemia and ATN.  Resolved     Latest Ref Rng & Units 05/02/2023    5:20 AM 04/18/2023    5:16 AM 04/04/2023    6:09 AM  BMP  Glucose 70 - 99 mg/dL 89  79  91   BUN 6 - 20 mg/dL 14  17  20    Creatinine 0.44 - 1.00 mg/dL 8.10  1.75  1.02   Sodium 135 - 145 mmol/L 138  137  138   Potassium 3.5 - 5.1 mmol/L 4.0  3.8  3.9   Chloride 98 - 111 mmol/L 109  105  105   CO2 22 - 32 mmol/L 23  23  24    Calcium 8.9 - 10.3 mg/dL 8.9  9.0  9.3     11.  Mild transaminitis with rhabdomyolysis.  Both resolved  12.  Hypotension:  -12/21-22/24 BPs still soft end of normal but seems to be her baseline; monitor Vitals:   05/03/23 0428 05/03/23 1356 05/03/23 1929 05/04/23 0353  BP: 105/71 101/72 95/62 (!) 107/58   05/04/23 2112 05/05/23 0629 05/05/23 1353 05/05/23 2100  BP: 102/66 102/67 94/67 104/68   05/06/23 0508 05/06/23 1439 05/06/23 2034 05/07/23 0548  BP: 107/66 98/66 (!) 101/57 123/83     13. HTN: resolved   14. Impaired initiation: continue amantadine 100mg  BID>> decreased to QD  15. Hyperglycemia: CBGs mildly elevated, d/ced checks. Prosource d/ced as per  patient's request  16. Spasticity:  Continue wrist, elbow brace, and PRAFOs. Ordered a night splint for LLE  D/c baclofen and tizanidine- much improved  17.  Dyskinetic movements in head and neck - appears to be myoclonic jerks related to hypoxic brain injury -  as discussed with pt mom, no specific meds for this on antispasticity meds , consioder benzo although with pt's SUD would avoid this class - EEG reviewed and was negative for seizure.  -  resolved   18. Cervical extensor weakness: decrease tizanidine to 2mg  HS, decrease baclofen to 5mg  QHS, improved, d/c'd  19. Bowel and bladder incontinence: continue bowel and bladder program. D/c miralax, decrease tizanidine to 2mg  HS, d/c'd  20. Fatigue: resolved, discussed with team may be secondary to anti-spasticity medications, B12 reviewed and is 335, in suboptimal range, will give 1,000U B12 injection 8/7, will order monthly as needed, metanx started, increase to BID, continue, d/c tizanidine, discussed with patient, d/c baclofen   21. MRSA nares positive: negative on repeat, precautions d/ced  22. Tachycardic: resolved, d/c propanolol. Continue tizanidine 2mg  HS. Resolved, amantadine increased back to 100mg  BID, continue this dose, resolved  23. S/p fall: CT head reviewed and is stable  24. Bilateral foot drop: bilateral AFOs ordered, discussed that these can help her to walk better  25. Vaginal itching: completed fluconazole. Having some discharge now. See below  26. >100k E Coli UTI: 11/28 sensitive to amoxil, completed 1 week course  27. Poor sitting balance: continue OT/PT, improving  28. Headaches: topamax 41m daily prn ordered, continue  29. Drowsiness with tramadol: d/ced  30. IUD in place? Pt states she has IUD x75yrs  and believes it's supposed to be removed soon(chart review- Dr. Seymour Bars placed Mirena IUD on 07/01/19, good for 12yrs); needs outpatient follow-up with OB/GYN  31. Constipation: d/c baclofen, last BM 12/18,  milk of magnesia ordered 12/20, LBM 12/23, continue magnesium gluconate  32. History of drug addiction: tramadol d/ced as per mom's preference      LOS: 170 days A FACE TO FACE EVALUATION WAS PERFORMED  Drema Pry Amauri Keefe 05/07/2023, 12:17 PM

## 2023-05-07 NOTE — Progress Notes (Signed)
Physical Therapy Session Note  Patient Details  Name: Nicole [name] MRN: [id number] Date of Birth: [birth year]  Today's Date: 05/07/2023 PT Individual Time: 1441-1525 PT Individual Time Calculation (min): 44 min   Short Term Goals: Week 9: PT Short Term Goal 1 (Week 9): Pt will initiate bed mobility with totalA PT Short Term Goal 2 (Week 9): Pt will maintain unsupported sitting balance for 30 seconds without LOB PT Short Term Goal 3 (Week 9): Pt will complete bed<>chair transfers with no more than totalA  Skilled Therapeutic Interventions/Progress Updates:  Patient supine in bed on entrance to room. Patient alert and agreeable to PT session.   Patient with no pain complaint at start of session.  Lying in bed and rustling through bag relating need to locate marker. Is pushing items around and questioned pt whether removing each item would help. Thanked therapist for idea and finally able to locate item.   Therapeutic Activity: Bed Mobility: Pt performed supine > sit with supervision requiring vc/ tc to place Bil feet on floor. Donned shoes MaxA for time. Transfers: Pt performed sit<>stand transfers throughout session with CGA to supervision throughout session. Provided vc/ tc for technique as described below in NMR.  Neuromuscular Re-ed: NMR facilitated during session with focus on standing balance, motor control, hip strengthening. Pt guided in improvement in technique for rise to stand and controlled sit from EOB to no AD. Blocked practice of correct foot positioning, upright posture, forward lean with hip hinge maintaining upright chest, push into extension with BLE. Pt struggles initially with new technique and prefers to "pop up" into rise to stand requiring up to MinA to maintain balance. Educated pt with verbal instructions and physical demonstration of biomechanics of rise to stand. Pt appreciative of explanation and with NDT tc at ribcage with slowly improving technique throughout. Is  able to improve to rise to stand with close supervision. Progressed to hold of standing balance to no UE support for up to almost 4 min with distraction of conversation. Demos improvement in overall balance with knees held in extension. Progressed to weight shifting to one LE in order to lift other foot and take a lateral step for wider BOS. Assisted into guarded bend of knees into minisquat x4 to each side. Pt with difficulty performing with RLE.   Returned to seated position on EOB and Rec Therapist enters room to show pt outfit she will wear the next day to play "Reunion" during next day's Christmas Eve events. Pt is able to maintain seated balance on EOB and perform reaching to items on tray table and to dress held by Rec Therapist. Assisted arms d/t reduced shoulder ROM. Does demo difficulty and discomfort with items further away.   NMR performed for improvements in motor control and coordination, balance, sequencing, judgement, and self confidence/ efficacy in performing all aspects of mobility at highest level of independence.   Patient supine in bed at end of session with brakes locked, bed alarm set, and all needs within reach.   Therapy Documentation Precautions:  Precautions Precautions: Fall Precaution Comments: decreased stand balance- reliant on UE, Pain in LLE limits mobility at times Required Braces or Orthoses: Other Brace Other Brace: B adjustable night splints; B elbow "cosey" splints; L palm protector; R mitt Restrictions Weight Bearing Restrictions Per Provider Order: No  Pain: Pain Assessment Pain Scale: 0-10 Pain Score: 3  Pain Location: Leg Pain Orientation: Left Pain Intervention(s): Medication (See eMAR)  Therapy/Group: Individual Therapy  Loel Dubonnet PT, DPT,  CSRS 05/07/2023, 7:35 PM

## 2023-05-08 MED ORDER — LORATADINE 10 MG PO TABS: 10.0000 mg | ORAL_TABLET | Freq: Every day | ORAL | Status: DC | PRN

## 2023-05-08 MED ORDER — BUSPIRONE HCL 10 MG PO TABS
5.0000 mg | ORAL_TABLET | Freq: Three times a day (TID) | ORAL | Status: DC | PRN
  Administered 2023-05-09 – 2023-06-11 (×14): 5 mg via ORAL
  Filled 2023-05-08 (×14): qty 1

## 2023-05-08 NOTE — Progress Notes (Signed)
Occupational Therapy Session Note  Patient Details  Name: NYCOLE SYLVA MRN: [id number] Date of Birth: Sep 11, 1975  Today's Date: 05/08/2023 OT Individual Time: 0849-1000 OT Individual Time Calculation (min): 71 min    Short Term Goals: Week 3:  OT Short Term Goal 1 (Week 3): Patient will trial timed toileting with mod cueing during the day OT Short Term Goal 1 - Progress (Week 3): Other (comment) OT Short Term Goal 2 (Week 3): Patient will don left sock with min assist OT Short Term Goal 2 - Progress (Week 3): Other (comment) OT Short Term Goal 3 (Week 3): Patient will put hair in ponytail using BUE and min assist OT Short Term Goal 3 - Progress (Week 3): Other (comment) OT Short Term Goal 4 (Week 3): xxx OT Short Term Goal 4 - Progress (Week 3): Other (comment) OT Short Term Goal 5 (Week 3): xxx OT Short Term Goal 5 - Progress (Week 3): Other (comment)  Skilled Therapeutic Interventions/Progress Updates:  Pt greeted supine in bed, pt agreeable to OT intervention.      Transfers/bed mobility: pt completed supine>sit with CGA, ambulatory transfers with RW and CGA greater than a household distance throughout session.    ADLs:  Grooming: pt completed standing oral care at sink with set- up assist to apply toothpaste, pt needed + assist for hair care today d/t larger ponytail holder needing more assist to mange hair.  UB dressing:pt donned [place] shirt with set- up assist from EOB  LB dressing: pt donned pants from EOB with light supervision for standing balance with + time  Footwear: donned shoes/socks with MIN A to don R sock mostly  Bathing: pt completed bathing from sitting/standing with CGA, emphasis on problem solving bathing tasks such as what to do when items are dropped/ running out of soap etc. Did shave pts arm pits with total A for safety.  Transfers: pt completed ambulatory transfer into bathroom with RW and CGA  Toileting: pt with continent urine void with light CGA for 3/3  toileting tasks.    Ended session with pt seatd in w/c with all needs within reach and safety belt alarm activated.                    Therapy Documentation Precautions:  Precautions Precautions: Fall Precaution Comments: decreased stand balance- reliant on UE, Pain in LLE limits mobility at times Required Braces or Orthoses: Other Brace Other Brace: B adjustable night splints; B elbow "cosey" splints; L palm protector; R mitt Restrictions Weight Bearing Restrictions Per Provider Order: No  Pain: no pain reported during session     Therapy/Group: Individual Therapy  Barron Schmid 05/08/2023, 12:14 PM

## 2023-05-08 NOTE — Patient Care Conference (Signed)
Inpatient RehabilitationTeam Conference and Plan of Care Update Date: 05/08/2023   Time: 12:07 PM    Patient Name: Nicole [name]      Medical Record Number: [id number]  Date of Birth: 23-Jul-1975 Sex: Female         Room/Bed: 4W09C/4W09C-01 Payor Info: Payor: [employer] / Plan: [employer] / Product Type: *No Product type* /    Admit Date/Time:  11/18/2022  3:15 PM  Primary Diagnosis:  Hypoxic brain injury [facility])  Hospital Problems: Principal Problem:   Hypoxic brain injury (HCC) Active Problems:   Protein-calorie malnutrition, severe   Essential hypertension   Pressure injury of skin    Expected Discharge Date: Expected Discharge Date:  (SNF pending)  Team Members Present: Physician leading conference: Dr. Sula Soda Social Worker Present: Lavera Guise, BSW Nurse Present: Chana Bode, RN PT Present: Midge Minium, PT OT Present: Mariann Barter, OT SLP Present: Feliberto Gottron, SLP PPS Coordinator present : Fae Pippin, SLP     Current Status/Progress Goal Weekly Team Focus  Bowel/Bladder     Incontinent even with toileting protocol     Continent of bowel and bladder    Toileting q 2 hours during the day; reinforce need to keep skin dry  Swallow/Nutrition/ Hydration               ADL's   Min assist BADL,Consistent assist for LLE   Goals min overall   functional mobility, use of LUE/LLE, Balance, shoulder Ranghe of motion, use of LUE    Mobility   minA bed mobility, CGA/minA stand pivot transfers, CGA for straight path gait >123ft using RW, minA for functional gait with turns. MinA for stairs. Decreased safety awareness, mild impulsivity   CGA/minA  functional gait, dynamic balance, safety awareness    Communication   decreased vocal intensity, especially in louder environments. Supervision-modI for quiet environment, modA for use of strategies in environments with more ambient noise   modi   consistent speech intelligibility  across settings/situations    Safety/Cognition/ Behavioral Observations  modI for orientation, min-mod for mildly complex problem solving, mod assist for daily information recall and use of memory book   supervision -minA   continue to address basic-mildly complex problem solving, sustained attention, handwriting, memory of daily events    Pain      Voltaren gel  and Tylenol prn for pain    Pain < 4 with prns    Assess effectiveness of medication  Skin     Left foot wound healing    No new skin breakdown and wounds healing    Assess skin q shift, keep dry and encourage boosting in chair and repositioning    Discharge Planning:  Continue to work on [employer]. SNF once approved for disability.   Team Discussion: Patient continues to make progress post hypoxic brain injury. Working on spasticity, balance and shoulder range of motion. Limited by spasticity, anxiety, and pain/hypersensitivity in left side.  Patient on target to meet rehab goals: Currently needs min assist for ADLs. Decreased vocal intelligibility; requires mod assist in a noisy environment. Needs mod assist for recall and problem solving. Goals for discharge set for min assist overall.  *See Care Plan and progress notes for long and short-term goals.   Revisions to Treatment Plan:  Buspar added for anxiety Discontinued Tramadol   Teaching Needs: Safety, medications, skin care, transfers, toileting, etc.   Current Barriers to Discharge: Home enviroment access/layout, Incontinence, Wound care, and Lack of/limited family support  Possible Resolutions to Barriers: SNF pending Disability application pending Letter of guarantee application pending     Medical Summary Current Status: anxiety, depression,polypharmacy  Barriers to Discharge: Medical stability  Barriers to Discharge Comments: anxiety, depression.polypharmacy Possible Resolutions to [employer]: placed on [employer], buspar  ordered as needed for anxiety, will schedule for outpatietn psychology/psychiatry follow-up, tramadol d/ced given history of addiction   Continued Need for Acute Rehabilitation Level of Care: The patient requires daily medical management by a physician with specialized training in physical medicine and rehabilitation for the following reasons: Direction of a multidisciplinary physical rehabilitation program to maximize functional independence : Yes Analysis of laboratory values and/or radiology reports with any subsequent need for medication adjustment and/or medical intervention. : Yes   I attest that I was present, lead the team conference, and concur with the assessment and plan of the team.   Chana Bode B 05/08/2023, 3:20 PM

## 2023-05-08 NOTE — Progress Notes (Signed)
PROGRESS NOTE   Subjective/Complaints: No new complaints this morning  Discussed her anxiety and depression, that we can get her on neuropsych list here, ordered Buspar prn for anxiety  ROS: Patient denies fever, rash, sore throat, blurred vision, dizziness, nausea, vomiting, diarrhea, cough, shortness of breath or chest pain, joint or back/neck pain, headache, or mood change. +anxiety and depression    Objective:   No results found.  No results for input(s): "WBC", "HGB", "HCT", "PLT" in the last 72 hours.        No results for input(s): "NA", "K", "CL", "CO2", "GLUCOSE", "BUN", "CREATININE", "CALCIUM" in the last 72 hours.         Intake/Output Summary (Last 24 hours) at 05/08/2023 0956 Last data filed at 05/07/2023 1835 Gross per 24 hour  Intake 436 ml  Output --  Net 436 ml           Physical Exam: Vital Signs Blood pressure 102/75, pulse 69, temperature 98.6 F (37 C), resp. rate 16, height 5\' 1"  (1.549 m), weight 52.5 kg, SpO2 99%.   General: No acute distress, sitting in w/c writing on a poster/large paper HEENT: NCAT, EOMI, oral membranes moist Cards: reg rate and rhythm, no m/r/g appreciated Chest: normal effort, CTAB Abdomen: Soft, NT, ND, +BS throughout Skin: dry, intact Extremities: no edema Psych: pleasant and appropriate , reports anxiety and depression Skin: + Scaling dried skin on bilateral heels left buttock unstageable - not examined + Left heel stage III DTI has resolved Neuro: memory seeming to improve lately Neurologic: AAOx2, fair insight and awareness..  + Hypophonation, speech intelligability much improved. Fair insight. MAS 1 right elbow,MAS  tr to 1 in RIght finger and wrist flexors  MAS 1 in extensors of LE's MAS 1 L elbow , MAS 1+ in L wrist and finger flexors. Hypersensitivity to left hand improved, Left sided strength improved, decreased range of motion and pain at  end range of motion in left shoulder- stable 12/18  Musculoskeletal: limited bilateral shoulder and elbow ROM due to increased tone ,right shoulder pain with PROM--stable 12/23     Assessment/Plan: 1. Functional deficits which require 3+ hours per day of interdisciplinary therapy in a comprehensive inpatient rehab setting. Physiatrist is providing close team supervision and 24 hour management of active medical problems listed below. Physiatrist and rehab team continue to assess barriers to discharge/monitor patient progress toward functional and medical goals  Care Tool:  Bathing    Body parts bathed by patient: Face, Abdomen, Chest, Left upper leg, Right upper leg, Left arm, Front perineal area, Right arm, Right lower leg, Left lower leg, Buttocks   Body parts bathed by helper: Buttocks Body parts n/a: Right arm, Left arm, Front perineal area, Buttocks, Right upper leg, Left upper leg, Right lower leg, Left lower leg   Bathing assist Assist Level: Contact Guard/Touching assist     Upper Body Dressing/Undressing Upper body dressing   What is the patient wearing?: Pull over shirt    Upper body assist Assist Level: Set up assist    Lower Body Dressing/Undressing Lower body dressing      What is the patient wearing?: Incontinence brief, Pants  Lower body assist Assist for lower body dressing: Minimal Assistance - Patient > 75%     Toileting Toileting Toileting Activity did not occur Press photographer and hygiene only): N/A (no void or bm)  Toileting assist Assist for toileting: Minimal Assistance - Patient > 75%     Transfers Chair/bed transfer  Transfers assist  Chair/bed transfer activity did not occur: Safety/medical concerns  Chair/bed transfer assist level: Contact Guard/Touching assist     Locomotion Ambulation   Ambulation assist   Ambulation activity did not occur: Safety/medical concerns  Assist level: Contact Guard/Touching assist Assistive  device: Walker-rolling Max distance: 150   Walk 10 feet activity   Assist  Walk 10 feet activity did not occur: Safety/medical concerns  Assist level: Contact Guard/Touching assist Assistive device: Walker-rolling   Walk 50 feet activity   Assist Walk 50 feet with 2 turns activity did not occur: Safety/medical concerns  Assist level: Contact Guard/Touching assist Assistive device: Walker-rolling    Walk 150 feet activity   Assist Walk 150 feet activity did not occur: Safety/medical concerns  Assist level: Contact Guard/Touching assist Assistive device: Walker-rolling    Walk 10 feet on uneven surface  activity   Assist Walk 10 feet on uneven surfaces activity did not occur: Safety/medical concerns   Assist level: Minimal Assistance - Patient > 75% Assistive device: Walker-rolling   Wheelchair     Assist Is the patient using a wheelchair?: Yes Type of Wheelchair: Manual    Wheelchair assist level: Moderate Assistance - Patient 50 - 74% Max wheelchair distance: 61ft    Wheelchair 50 feet with 2 turns activity    Assist    Wheelchair 50 feet with 2 turns activity did not occur: Safety/medical concerns   Assist Level: Supervision/Verbal cueing   Wheelchair 150 feet activity     Assist  Wheelchair 150 feet activity did not occur: Safety/medical concerns   Assist Level: Moderate Assistance - Patient 50 - 74%   Blood pressure 102/75, pulse 69, temperature 98.6 F (37 C), resp. rate 16, height 5\' 1"  (1.549 m), weight 52.5 kg, SpO2 99%.  Medical Problem List and Plan: 1. 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 Awaiting approval of disability. Placement pending. Hoping that hospital will provide LOG for placement. Code status  DNR -Continue CIR therapies including PT, OT, and SLP   Team conference 12/4  Have discussed prognosis with mom, will discuss with patient  2.  Impaired mobility: continue Lovenox 30mg  daily, weekly creatinine ordered             -antiplatelet therapy: N/A  3. Pain:  -Knee pain: Tylenol as needed, continue voltaren gel scheduled. XR reviewed and is stable, discussed with therapy -Right wrist swelling: XR ordered and shows no acute fracture, shows 2.106mm ulnar variance, brace removed to given patient a break from it. Voltaren gel ordered prn for discomfort, continue, CT ordered and show no acute osseus injury, continue ace wrap   -Foot pain: tramadol ordered prn, improved today, continue prn  -Headaches:continue tylenol and tramadol prn -Low back pain: continue kpad, asked [place] to please bring for her -Whole body hypersensitivity: gabapentin 100mg  TID ordered -Pain in left heel:  XR obtained and negative, cam boot ordered to help offload heel with therapy, discussed that this has helped -R shoulder pain: XR neg for acute etiology on 04/06/23, voltaren gel ordered--encourage ROM as tolerated  4. History of anxiety: discussed with her mom that she feels this was a big risk factor for her accident, propanolol d/ced due to hypotension. Continue magnesium supplement. Asked SW to place her on neuropsych list, Buspar ordered prn  5. Neuropsych/cognition: This patient is not capable of making decisions on her own behalf  - 10/12: Still not capable of medical decision making; BUT oriented and communicating, following directions - MUCH improved - PRN melatonin 3mg    for insomnia  6. Skin: -Buttock/sacrum--foam dressing, pressure relief, nutrition -stage 3 left heel, continue foam dressing/ pressure relief  7. Fluids/Electrolytes/Nutrition: Routine in and outs with follow-up  chemistries  -Eating  well with cueing   -Dysphagia: continue D3/thins--tolerating this diet -Low protein stores: d/c prosource since patient hates these and is eating 100% of meals -Suboptimal vitamin D level: increase to 5,000 U daily. continue this dose  -History of magnesium deficiency: magnesium 500mg  stopped to support BP  -12/17 good po intake 8.  Cavitary right lower lobe pneumonia likely aspiration pneumonia/MRSA pneumonia.  Resolved    Latest Ref Rng & Units 05/02/2023    5:20 AM 04/18/2023    5:16 AM 04/04/2023    6:09 AM  CBC  WBC 4.0 - 10.5 K/uL 5.9  6.0  5.6   Hemoglobin 12.0 - 15.0 g/dL 69.6  29.5  28.4   Hematocrit 36.0 - 46.0 % 43.0  41.6  42.1   Platelets 150 - 400 K/uL 241  194  224      9.  History of drug abuse.  Positive cocaine on urine drug screen.  Provide counseling  10.  AKI/hypovolemia and ATN.  Resolved     Latest Ref Rng & Units 05/02/2023    5:20 AM 04/18/2023    5:16 AM 04/04/2023    6:09 AM  BMP  Glucose 70 - 99 mg/dL 89  79  91   BUN 6 - 20 mg/dL 14  17  20    Creatinine 0.44 - 1.00 mg/dL 1.32  4.40  1.02   Sodium 135 - 145 mmol/L 138  137  138   Potassium 3.5 - 5.1 mmol/L 4.0  3.8  3.9   Chloride 98 - 111 mmol/L 109  105  105   CO2 22 - 32 mmol/L 23  23  24    Calcium 8.9 - 10.3 mg/dL 8.9  9.0  9.3     11.  Mild transaminitis with rhabdomyolysis.  Both resolved  12.  Hypotension:  -12/21-22/24 BPs still soft end of normal but seems to be her baseline; monitor Vitals:   05/04/23 0353 05/04/23 2112 05/05/23 0629 05/05/23 1353  BP: (!) 107/58 102/66 102/67 94/67   05/05/23 2100 05/06/23 0508 05/06/23 1439 05/06/23 2034  BP: 104/68 107/66 98/66 (!) 101/57   05/07/23 0548 05/07/23 1400 05/07/23 1936 05/08/23 0518  BP: 123/83 104/69 100/72 102/75     13. HTN: resolved   14. Impaired initiation: continue amantadine 100mg  BID>> decreased to QD  15. Hyperglycemia: CBGs mildly elevated, d/ced checks. Prosource d/ced as per patient's  request  16. Spasticity:  Continue wrist, elbow brace, and PRAFOs. Ordered a night splint for LLE  D/c baclofen and tizanidine- much improved  17.  Dyskinetic movements in head and neck - appears to be myoclonic jerks related to hypoxic brain  injury - as discussed with pt mom, no specific meds for this on antispasticity meds , consioder benzo although with pt's SUD would avoid this class - EEG reviewed and was negative for seizure.  -  resolved   18. Cervical extensor weakness: decrease tizanidine to 2mg  HS, decrease baclofen to 5mg  QHS, improved, d/c'd  19. Bowel and bladder incontinence: continue bowel and bladder program. D/c miralax, decrease tizanidine to 2mg  HS, d/c'd  20. Fatigue: resolved, discussed with team may be secondary to anti-spasticity medications, B12 reviewed and is 335, in suboptimal range, will give 1,000U B12 injection 8/7, will order monthly as needed, metanx started, increase to BID, continue, d/c tizanidine, discussed with patient, d/c baclofen   21. MRSA nares positive: negative on repeat, precautions d/ced  22. Tachycardic: resolved, d/c propanolol. Continue tizanidine 2mg  HS. Resolved, amantadine increased back to 100mg  BID, continue this dose, resolved  23. S/p fall: CT head reviewed and is stable  24. Bilateral foot drop: bilateral AFOs ordered, discussed that these can help her to walk better  25. Vaginal itching: completed fluconazole. Having some discharge now. See below  26. >100k E Coli UTI: 11/28 sensitive to amoxil, completed 1 week course  27. Poor sitting balance: continue OT/PT, improving  28. Headaches: topamax 92m daily prn ordered, continue  29. Drowsiness with tramadol: d/ced  30. IUD in place? Pt states she has IUD x77yrs  and believes it's supposed to be removed soon(chart review- Dr. Seymour Bars placed Mirena IUD on 07/01/19, good for 41yrs); needs outpatient follow-up with OB/GYN  31. Constipation: d/c baclofen, last BM 12/18, milk of  magnesia ordered 12/20, LBM 12/23, continue magnesium gluconate  32. History of drug addiction: tramadol d/ced as per mom's preference  33. Depression: discussed scheduling for outpatient psychology/psychiatry follow-up  34. Sinusitis: claritin ordered prn      LOS: 171 days A FACE TO FACE EVALUATION WAS PERFORMED  Nicole [name] 05/08/2023, 9:56 AM

## 2023-05-08 NOTE — Progress Notes (Signed)
Patient ID: Nicole [name], female   DOB: 1976/01/14, 47 y.o.   MRN: [id number]  Team Conference Report to Patient/Family  Team Conference discussion was reviewed with the patient and caregiver, including goals, any changes in plan of care and target discharge date.  Patient and caregiver express understanding and are in agreement.  The patient has a target discharge date of  (SNF pending).  Covering for primary SW, Becky   SW spoke with mother to inform her that currently Primary SW, Nicole [name] is still awaiting disability approval for SNF. Mother inquiring about the potential of behavior health/mental health placement. SW will  have primary SW follow up on Monday. No additional questions or concerns. Andria Rhein 05/08/2023, 12:19 PM

## 2023-05-08 NOTE — Progress Notes (Signed)
Physical Therapy Session Note  Patient Details  Name: Nicole [name] MRN: [id number] Date of Birth: 27-Jul-1975  Today's Date: 05/08/2023 PT Individual Time: 1045-1200 + [id number] PT Individual Time Calculation (min): 75 min  + 27 min  Short Term Goals: Week 1:  PT Short Term Goal 1 (Week 1): Pt will complete bed mobility with CGA PT Short Term Goal 1 - Progress (Week 1): Not met PT Short Term Goal 2 (Week 1): Pt will ambulate 155ft with CGA and LRAD PT Short Term Goal 2 - Progress (Week 1): Not met PT Short Term Goal 3 (Week 1): Pt will complete functional outcome measure to assess falls risk PT Short Term Goal 3 - Progress (Week 1): Not met  Skilled Therapeutic Interventions/Progress Updates:      1st session: Pt sitting in wheelchair to start - no reports of pain. Dressed in [place] and agreeable to PT tx. Pt with moderate decreased safety awareness and mild impulsivity to start session - attempted to stand from wheelchair (with seat belt alarm on) without brakes engaged, at the sink so that she could reach the soap to wash her hands. Educated on safety precautions and falls risk.   First part of session patient participated giving out gifts to patients around CIR floor. Pt ambulated with minA overall, pushing a "cart" of gift bags around the floor. Patient needing min cues for safety awareness and minA for stability and balance. Pt tolerated >30 minutes of walking (!!!) while passing out gifts. ModA for safety ambulating backwards, when needed at times while exiting patient's rooms.   She then completed seated level therapeutic activity with painting Christmas Ornaments and decorating them. Min cues for awareness and setupA required due to [facility] and [facility] deficits.   Pt returned to her room at end of treatment. Left sitting upright in wheelchair with her mom visiting and present. All needs met.    2nd session: Pt sitting in wheelchair with mother present. Pt has no pain and  agreeable to therapy treatment.   Pt requesting to participate in Dance Group in Day Room rehab gym. Transported at wheelchair level. She participated in both seated and standing positions. She required frequent cueing for following dance leader instructions, following sequencing of movement patterns, and keeping up with the cadence of the class. PT provided gentle stretching of her hips and hamstrings for a few of the songs. With standing dancing, she needed minA overall for stability while stepping sideways to the music, backward stepping to the music, and modA for turning 360deg. Pt active in participation throughout, her selective attention fading as session progressed.   Returned to her room and patient requesting to return to bed to rest. MinA stand pivot transfer and patient "crawling" into bed with minA. Patient needing modA for repositioning in the bed after crawling in. Alarm on, call bell within reach.      Therapy Documentation Precautions:  Precautions Precautions: Fall Precaution Comments: decreased stand balance- reliant on UE, Pain in LLE limits mobility at times Required Braces or Orthoses: Other Brace Other Brace: B adjustable night splints; B elbow "cosey" splints; L palm protector; R mitt Restrictions Weight Bearing Restrictions Per Provider Order: No General:      Therapy/Group: Individual Therapy  Orrin Brigham 05/08/2023, 7:48 AM

## 2023-05-09 MED ORDER — DICLOFENAC SODIUM 1 % EX GEL
2.0000 g | Freq: Four times a day (QID) | CUTANEOUS | Status: DC | PRN
  Administered 2023-05-10 – 2023-05-14 (×3): 2 g via TOPICAL

## 2023-05-09 NOTE — Progress Notes (Signed)
PROGRESS NOTE   Subjective/Complaints: No new complaints this morning Provided with list of foods for anxiety and she is appreciative, has not needed to try Buspar yet  ROS: Patient denies fever, rash, sore throat, blurred vision, dizziness, nausea, vomiting, diarrhea, cough, shortness of breath or chest pain, joint or back/neck pain, headache, or mood change. +anxiety and depression    Objective:   No results found.  No results for input(s): "WBC", "HGB", "HCT", "PLT" in the last 72 hours.        No results for input(s): "NA", "K", "CL", "CO2", "GLUCOSE", "BUN", "CREATININE", "CALCIUM" in the last 72 hours.         Intake/Output Summary (Last 24 hours) at 05/09/2023 1021 Last data filed at 05/09/2023 0738 Gross per 24 hour  Intake 960 ml  Output --  Net 960 ml           Physical Exam: Vital Signs Blood pressure 105/70, pulse 80, temperature 98 F (36.7 C), resp. rate 18, height 5\' 1"  (1.549 m), weight 52.5 kg, SpO2 97%.   General: No acute distress, sitting in w/c writing on a poster/large paper HEENT: NCAT, EOMI, oral membranes moist Cards: reg rate and rhythm, no m/r/g appreciated Chest: normal effort, CTAB Abdomen: Soft, NT, ND, +BS throughout Skin: dry, intact Extremities: no edema Psych: pleasant and appropriate , reports anxiety and depression Skin: + Scaling dried skin on bilateral heels left buttock unstageable - not examined + Left heel stage III DTI has resolved Neuro: memory seeming to improve lately Neurologic: AAOx2, fair insight and awareness..  + Hypophonation, speech intelligability much improved. Fair insight. MAS 1 right elbow,MAS  tr to 1 in RIght finger and wrist flexors  MAS 1 in extensors of LE's MAS 1 L elbow , MAS 1+ in L wrist and finger flexors. Hypersensitivity to left hand improved, Left sided strength improved, decreased range of motion and pain at end range of  motion in left shoulder- stable 12/18  Musculoskeletal: limited bilateral shoulder and elbow ROM due to increased tone ,right shoulder pain with PROM--stable 12/25     Assessment/Plan: 1. Functional deficits which require 3+ hours per day of interdisciplinary therapy in a comprehensive inpatient rehab setting. Physiatrist is providing close team supervision and 24 hour management of active medical problems listed below. Physiatrist and rehab team continue to assess barriers to discharge/monitor patient progress toward functional and medical goals  Care Tool:  Bathing    Body parts bathed by patient: Face, Abdomen, Chest, Left upper leg, Right upper leg, Left arm, Front perineal area, Right arm, Right lower leg, Left lower leg, Buttocks   Body parts bathed by helper: Buttocks Body parts n/a: Right arm, Left arm, Front perineal area, Buttocks, Right upper leg, Left upper leg, Right lower leg, Left lower leg   Bathing assist Assist Level: Contact Guard/Touching assist     Upper Body Dressing/Undressing Upper body dressing   What is the patient wearing?: Pull over shirt    Upper body assist Assist Level: Set up assist    Lower Body Dressing/Undressing Lower body dressing      What is the patient wearing?: Incontinence brief, Pants     Lower  body assist Assist for lower body dressing: Minimal Assistance - Patient > 75%     Toileting Toileting Toileting Activity did not occur Press photographer and hygiene only): N/A (no void or bm)  Toileting assist Assist for toileting: Minimal Assistance - Patient > 75%     Transfers Chair/bed transfer  Transfers assist  Chair/bed transfer activity did not occur: Safety/medical concerns  Chair/bed transfer assist level: Contact Guard/Touching assist     Locomotion Ambulation   Ambulation assist   Ambulation activity did not occur: Safety/medical concerns  Assist level: Contact Guard/Touching assist Assistive device:  Walker-rolling Max distance: 150   Walk 10 feet activity   Assist  Walk 10 feet activity did not occur: Safety/medical concerns  Assist level: Contact Guard/Touching assist Assistive device: Walker-rolling   Walk 50 feet activity   Assist Walk 50 feet with 2 turns activity did not occur: Safety/medical concerns  Assist level: Contact Guard/Touching assist Assistive device: Walker-rolling    Walk 150 feet activity   Assist Walk 150 feet activity did not occur: Safety/medical concerns  Assist level: Contact Guard/Touching assist Assistive device: Walker-rolling    Walk 10 feet on uneven surface  activity   Assist Walk 10 feet on uneven surfaces activity did not occur: Safety/medical concerns   Assist level: Minimal Assistance - Patient > 75% Assistive device: Walker-rolling   Wheelchair     Assist Is the patient using a wheelchair?: Yes Type of Wheelchair: Manual    Wheelchair assist level: Moderate Assistance - Patient 50 - 74% Max wheelchair distance: 31ft    Wheelchair 50 feet with 2 turns activity    Assist    Wheelchair 50 feet with 2 turns activity did not occur: Safety/medical concerns   Assist Level: Supervision/Verbal cueing   Wheelchair 150 feet activity     Assist  Wheelchair 150 feet activity did not occur: Safety/medical concerns   Assist Level: Moderate Assistance - Patient 50 - 74%   Blood pressure 105/70, pulse 80, temperature 98 F (36.7 C), resp. rate 18, height 5\' 1"  (1.549 m), weight 52.5 kg, SpO2 97%.  Medical Problem List and Plan: 1. 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  Awaiting approval of disability. Placement pending. Hoping that hospital will provide LOG for placement. Code status  DNR -Continue CIR therapies including PT, OT, and SLP   Team conference 12/4  Have discussed prognosis with mom, will discuss with patient  [name] pass ordered  2.  Impaired mobility: d/c lovenox since ambulating >150 feet, SCDs ordered  3. Pain:  -Knee pain: Tylenol as needed, continue voltaren gel- changed to prn. XR reviewed and is stable, discussed with therapy -Right wrist swelling: XR ordered and shows no acute fracture, shows 2.30mm ulnar variance, brace removed to given patient a break from it. Voltaren gel ordered prn for discomfort, continue, CT ordered and show no acute osseus injury, continue ace wrap   -Foot pain: tramadol ordered prn, improved today, continue prn  -Headaches:continue tylenol and tramadol prn -Low back pain: continue kpad, asked [place] to please bring for her -Whole body hypersensitivity: gabapentin 100mg  TID ordered -Pain in left heel: XR obtained and negative, cam boot ordered to help  offload heel with therapy, discussed that this has helped -R shoulder pain: XR neg for acute etiology on 04/06/23, voltaren gel ordered--encourage ROM as tolerated  4. History of anxiety: discussed with her mom that she feels this was a big risk factor for her accident, propanolol d/ced due to hypotension. Continue magnesium supplement. Asked SW to place her on neuropsych list, Buspar ordered prn, discussed placing outpatient referral for behavioral health f/u  5. Neuropsych/cognition: This patient is not capable of making decisions on her own behalf  - 10/12: Still not capable of medical decision making; BUT oriented and communicating, following directions - MUCH improved - PRN melatonin 3mg    for insomnia  6. Skin: -Buttock/sacrum--foam dressing, pressure relief, nutrition -stage 3 left heel, continue foam dressing/ pressure relief  7.  Fluids/Electrolytes/Nutrition: Routine in and outs with follow-up chemistries  -Eating  well with cueing   -Dysphagia: continue D3/thins--tolerating this diet -Low protein stores: d/c prosource since patient hates these and is eating 100% of meals -Suboptimal vitamin D level: increase to 5,000 U daily. continue this dose  -History of magnesium deficiency: magnesium 500mg  stopped to support BP  -12/17 good po intake 8.  Cavitary right lower lobe pneumonia likely aspiration pneumonia/MRSA pneumonia.  Resolved    Latest Ref Rng & Units 05/02/2023    5:20 AM 04/18/2023    5:16 AM 04/04/2023    6:09 AM  CBC  WBC 4.0 - 10.5 K/uL 5.9  6.0  5.6   Hemoglobin 12.0 - 15.0 g/dL 32.9  51.8  84.1   Hematocrit 36.0 - 46.0 % 43.0  41.6  42.1   Platelets 150 - 400 K/uL 241  194  224      9.  History of drug abuse.  Positive cocaine on urine drug screen.  Provide counseling  10.  AKI/hypovolemia and ATN.  Resolved     Latest Ref Rng & Units 05/02/2023    5:20 AM 04/18/2023    5:16 AM 04/04/2023    6:09 AM  BMP  Glucose 70 - 99 mg/dL 89  79  91   BUN 6 - 20 mg/dL 14  17  20    Creatinine 0.44 - 1.00 mg/dL 6.60  6.30  1.60   Sodium 135 - 145 mmol/L 138  137  138   Potassium 3.5 - 5.1 mmol/L 4.0  3.8  3.9   Chloride 98 - 111 mmol/L 109  105  105   CO2 22 - 32 mmol/L 23  23  24    Calcium 8.9 - 10.3 mg/dL 8.9  9.0  9.3     11.  Mild transaminitis with rhabdomyolysis.  Both resolved  12.  Hypotension:  -12/21-22/24 BPs still soft end of normal but seems to be her baseline; monitor Vitals:   05/05/23 1353 05/05/23 2100 05/06/23 0508 05/06/23 1439  BP: 94/67 104/68 107/66 98/66   05/06/23 2034 05/07/23 0548 05/07/23 1400 05/07/23 1936  BP: (!) 101/57 123/83 104/69 100/72   05/08/23 0518 05/08/23 1300 05/08/23 1941 05/09/23 0315  BP: 102/75 102/80 105/68 105/70     13. HTN: resolved   14. Impaired initiation: continue amantadine 100mg  BID>> decreased to QD  15. Hyperglycemia: CBGs mildly  elevated, d/ced checks. Prosource d/ced as per patient's request  16. Spasticity:  Continue wrist, elbow brace, and PRAFOs. Ordered a night splint for LLE  D/c baclofen and tizanidine- much improved  17.  Dyskinetic movements in head and neck - appears to be myoclonic jerks related to hypoxic brain injury -  as discussed with pt mom, no specific meds for this on antispasticity meds , consioder benzo although with pt's SUD would avoid this class - EEG reviewed and was negative for seizure.  -  resolved   18. Cervical extensor weakness: decrease tizanidine to 2mg  HS, decrease baclofen to 5mg  QHS, improved, d/c'd  19. Bowel and bladder incontinence: continue bowel and bladder program. D/c miralax, decrease tizanidine to 2mg  HS, d/c'd  20. Fatigue: resolved, discussed with team may be secondary to anti-spasticity medications, B12 reviewed and is 335, in suboptimal range, will give 1,000U B12 injection 8/7, will order monthly as needed, metanx started, increase to BID, continue, d/c tizanidine, discussed with patient, d/c baclofen   21. MRSA nares positive: negative on repeat, precautions d/ced  22. Tachycardic: resolved, d/c propanolol. Continue tizanidine 2mg  HS. Resolved, amantadine increased back to 100mg  BID, continue this dose, resolved  23. S/p fall: CT head reviewed and is stable  24. Bilateral foot drop: bilateral AFOs ordered, discussed that these can help her to walk better  25. Vaginal itching: completed fluconazole. Having some discharge now. See below  26. >100k E Coli UTI: 11/28 sensitive to amoxil, completed 1 week course  27. Poor sitting balance: continue OT/PT, improving  28. Headaches: topamax 51m daily prn ordered, continue  29. Drowsiness with tramadol: d/ced  30. IUD in place? Pt states she has IUD x78yrs  and believes it's supposed to be removed soon(chart review- Dr. Seymour Bars placed Mirena IUD on 07/01/19, good for 68yrs); needs outpatient follow-up with OB/GYN  31.  Constipation: d/c baclofen, last BM 12/18, milk of magnesia ordered 12/20, LBM 12/23, continue magnesium gluconate  32. History of drug addiction: tramadol d/ced as per mom's preference  33. Depression: discussed scheduling for outpatient psychology/psychiatry follow-up  34. Sinusitis: claritin ordered prn      LOS: 172 days A FACE TO FACE EVALUATION WAS PERFORMED  Clint Bolder P Tytan Sandate 05/09/2023, 10:21 AM

## 2023-05-09 NOTE — Plan of Care (Signed)
  Problem: Consults Goal: RH STROKE PATIENT EDUCATION Description: See Patient Education module for education specifics  Outcome: Progressing   Problem: RH BOWEL ELIMINATION Goal: RH STG MANAGE BOWEL WITH ASSISTANCE Description: STG Manage Bowel with mod I Assistance. Outcome: Progressing Goal: RH STG MANAGE BOWEL W/MEDICATION W/ASSISTANCE Description: STG Manage Bowel with Medication with mod I Assistance. Outcome: Progressing   Problem: RH BLADDER ELIMINATION Goal: RH STG MANAGE BLADDER WITH ASSISTANCE Description: STG Manage Bladder With toileting Assistance Outcome: Progressing   Problem: RH SKIN INTEGRITY Goal: RH STG MAINTAIN SKIN INTEGRITY WITH ASSISTANCE Description: STG Maintain Skin Integrity With min  Assistance. Outcome: Progressing

## 2023-05-10 NOTE — Progress Notes (Signed)
Physical Therapy Session Note  Patient Details  Name: Nicole [name] MRN: [id number] Date of Birth: 28-Aug-1975  Today's Date: 05/10/2023 PT Individual Time: 4098-1191 PT Individual Time Calculation (min): 46 min   Short Term Goals: Week 1:  PT Short Term Goal 1 (Week 1): Pt will complete bed mobility with CGA PT Short Term Goal 1 - Progress (Week 1): Not met PT Short Term Goal 2 (Week 1): Pt will ambulate 140ft with CGA and LRAD PT Short Term Goal 2 - Progress (Week 1): Not met PT Short Term Goal 3 (Week 1): Pt will complete functional outcome measure to assess falls risk PT Short Term Goal 3 - Progress (Week 1): Not met  Skilled Therapeutic Interventions/Progress Updates:   Pt was supine in bed upon PT arrival. Reports 8/10 pain in her knees. PT notified nursing who stated they had just applied Voltaren gel 15 min prior and to let pt know it needed time to work. PT let pt know and she agreed to therapy participation.  Pt with complaints of L quad spasms and noted to prop R foot behind flexed L knee. QS x 15, heel slides x 15 performed bil. Supine to sit to supine S; STS CGA from EOB x 10 with concentration on eccentric descent and PT max verbal cues to not pull up on RW and for proper hand placement.Standing reaches with RW reaching various angles for functional LE strengthening and balance challenge. Standing tolerance at 2 min 2 sec with RW for activities. Gait 4 bouts of 24 ft with concentration on slow turns, posture, and proper stride with RW with PT CGA.  There were no complaints of knee pain with WB activities or gait.  Pt returned to bed; bed alarm on, all needs within reach. No complaints of bil knee pain.      Therapy Documentation Precautions:  Precautions Precautions: Fall Precaution Comments: decreased stand balance- reliant on UE, Pain in LLE limits mobility at times Required Braces or Orthoses: Other Brace Other Brace: B adjustable night splints; B elbow "cosey"  splints; L palm protector; R mitt Restrictions Weight Bearing Restrictions Per Provider Order: No       Therapy/Group: Individual Therapy  Luna Fuse 05/10/2023, 7:15 AM

## 2023-05-10 NOTE — Progress Notes (Signed)
Occupational Therapy Weekly Progress Note  Patient Details  Name: ANNETH BRINKMAN MRN: [id number] Date of Birth: 05-Jan-1976  Beginning of progress report period: May 02, 2023 End of progress report period: May 10, 2023  Today's Date: 05/10/2023 OT Individual Time: 6962-9528 OT Individual Time Calculation (min): 78 min    Patient has met 0 of 3 short term goals.  Patient making progress toward all goals, yet remains incontinent even with timed toileting.    Patient continues to demonstrate the following deficits: muscle weakness and muscle joint tightness, impaired timing and sequencing, abnormal tone, unbalanced muscle activation, motor apraxia, and decreased coordination, decreased attention to left and ideational apraxia, decreased initiation, decreased attention, decreased awareness, decreased problem solving, decreased safety awareness, decreased memory, and delayed processing, and decreased standing balance, decreased postural control, hemiplegia, and decreased balance strategies and therefore will continue to benefit from skilled OT intervention to enhance overall performance with BADL and Reduce care partner burden.  Patient progressing toward long term goals..  Continue plan of care.  OT Short Term Goals Week 3:  OT Short Term Goal 1 (Week 22): Patient will trial timed toileting with mod cueing during the day OT Short Term Goal 1 - Progress (Week 22): Progressing toward goal OT Short Term Goal 2 (Week 22): Patient will don left sock with min assist OT Short Term Goal 2 - Progress (Week 22): Progressing toward goal OT Short Term Goal 3 (Week 22): Patient will put hair in ponytail using BUE and min assist OT Short Term Goal 3 - Progress (Week 22): Progressing toward goal  Week 4:  OT Short Term Goal 1 (Week 23): Patient will motor plan to transition from supine to sitting from flat surface with min assist OT Short Term Goal 3 (Week 23): Patient will don left sock with min  assist OT Short Term Goal 4 (Week 23): Patient will trial wearing underwear during weekday x 2 OT Short Term Goal 5 (Week 23): Patient will grab hair to place in ponytail with BUE   Skilled Therapeutic Interventions/Progress Updates:    Patient received supine in bed, requesting to shower.  Patient declined need to void, but stood at toilet to remove lower body clothing, using grab bars to balance while stepping out of pant legs.  Patient is much more mobile, but apraxia evident with her attempts to motor plan through various familiar activities.  Patient opting to sit to shower today to allow hands free for bathing.  Patient incontinent of large mount of stool following shower and needing to walk to toilet to finish BM.  Patient with poor awareness of bladder and bowel- continue to encourage frequent (2 hour) toileting to help her self regulate.  Patient able to sit in chair at sink to complete grooming and dressing.  Transported to gym to address transitional movements - to address motor planning- getting into and out of common positions, but also to address left UE range of motion and sustained activation.   Patient returned to room and bed with bed alarm engaged and direct hand off to nursing.  Patient has call bell. And personal items in reach.    Therapy Documentation Precautions:  Precautions Precautions: Fall Precaution Comments: decreased stand balance- reliant on UE, Pain in LLE limits mobility at times Required Braces or Orthoses: Other Brace Other Brace: B adjustable night splints; B elbow "cosey" splints; L palm protector; R mitt Restrictions Weight Bearing Restrictions Per Provider Order: No  Pain: Pain Assessment Pain Scale: 0-10 Pain Score:  0-No pain    Therapy/Group: Individual Therapy  [name] 05/10/2023, 3:20 PM

## 2023-05-10 NOTE — Progress Notes (Signed)
Physical Therapy Session Note  Patient Details  Name: Nicole [name] MRN: [id number] Date of Birth: [birth year]  Today's Date: 05/10/2023 PT Individual Time: 1001-1059 PT Individual Time Calculation (min): 58 min   Short Term Goals: Week 1:  PT Short Term Goal 1 (Week 1): Pt will complete bed mobility with CGA PT Short Term Goal 1 - Progress (Week 1): Not met PT Short Term Goal 2 (Week 1): Pt will ambulate 158ft with CGA and LRAD PT Short Term Goal 2 - Progress (Week 1): Not met PT Short Term Goal 3 (Week 1): Pt will complete functional outcome measure to assess falls risk PT Short Term Goal 3 - Progress (Week 1): Not met  Skilled Therapeutic Interventions/Progress Updates:      Pt seated in [facility] upon arrival. Pt agreeable to therapy. Pt denies any pain.   Pt ambulated from room to main gym with RW and CGA/close supervision, verbal cues provided for continuous movement of RW and upright posture, and heel toe pattern.   Pt performed 2x10 sit to stand with no AD  and CGA, vebral cues provided for proper technique to avoid pushing B LE from mat table.   Pt performed lateral stepping 2x10 feet bilaterally with B UE support on // bars, verbal cues provided for technique and large steps. Pt demos increased difficulty stepping to L in comparison to R.   Pt weaved in and out of cones with RW and min A, max verbal cues provided for technqiue and safety with RW especially with turning to R, as pt demos tendency to step L LE outside of RW.   Attempted toe taps without AD on 4 inch step, however discontinued 2/2 increase in L knee pain with no AD.   Pt requesting to donn sequential compression at end of session for pain relief. . Pt supine in bed with HOB with sequential compression on.    Therapy Documentation Precautions:  Precautions Precautions: Fall Precaution Comments: decreased stand balance- reliant on UE, Pain in LLE limits mobility at times Required Braces or Orthoses: Other  Brace Other Brace: B adjustable night splints; B elbow "cosey" splints; L palm protector; R mitt Restrictions Weight Bearing Restrictions Per Provider Order: No  Therapy/Group: Individual Therapy  [facility] Ambrose [place], [place], DPT  05/10/2023, 7:36 AM

## 2023-05-10 NOTE — Plan of Care (Signed)
  Problem: Consults Goal: RH STROKE PATIENT EDUCATION Description: See Patient Education module for education specifics  Outcome: Progressing   Problem: RH BOWEL ELIMINATION Goal: RH STG MANAGE BOWEL WITH ASSISTANCE Description: STG Manage Bowel with mod I Assistance. Outcome: Progressing Goal: RH STG MANAGE BOWEL W/MEDICATION W/ASSISTANCE Description: STG Manage Bowel with Medication with mod I Assistance. Outcome: Progressing   Problem: RH BLADDER ELIMINATION Goal: RH STG MANAGE BLADDER WITH ASSISTANCE Description: STG Manage Bladder With toileting Assistance Outcome: Progressing   Problem: RH SKIN INTEGRITY Goal: RH STG MAINTAIN SKIN INTEGRITY WITH ASSISTANCE Description: STG Maintain Skin Integrity With min  Assistance. Outcome: Progressing

## 2023-05-10 NOTE — Progress Notes (Signed)
PROGRESS NOTE   Subjective/Complaints: Tried the Buspar last night and found it very helpful Provided her with list of foods for her pain and she is appreciative  ROS: Patient denies fever, rash, sore throat, blurred vision, dizziness, nausea, vomiting, diarrhea, cough, shortness of breath or chest pain, joint or back/neck pain, headache, or mood change. +anxiety and depression- improved    Objective:   No results found.  No results for input(s): "WBC", "HGB", "HCT", "PLT" in the last 72 hours.        No results for input(s): "NA", "K", "CL", "CO2", "GLUCOSE", "BUN", "CREATININE", "CALCIUM" in the last 72 hours.         Intake/Output Summary (Last 24 hours) at 05/10/2023 1017 Last data filed at 05/10/2023 0721 Gross per 24 hour  Intake 960 ml  Output 500 ml  Net 460 ml           Physical Exam: Vital Signs Blood pressure (!) 107/54, pulse 83, temperature 98.2 F (36.8 C), resp. rate 16, height 5\' 1"  (1.549 m), weight 114 kg, SpO2 98%.   General: No acute distress, sitting in w/c writing on a poster/large paper HEENT: NCAT, EOMI, oral membranes moist Cards: reg rate and rhythm, no m/r/g appreciated Chest: normal effort, CTAB Abdomen: Soft, NT, ND, +BS throughout Skin: dry, intact Extremities: no edema Psych: pleasant and appropriate , reports anxiety and depression Skin: + Scaling dried skin on bilateral heels left buttock unstageable - not examined + Left heel stage III DTI has resolved Neuro: memory seeming to improve lately Neurologic: AAOx2, fair insight and awareness..  + Hypophonation, speech intelligability much improved. Fair insight. MAS 1 right elbow,MAS  tr to 1 in RIght finger and wrist flexors  MAS 1 in extensors of LE's MAS 1 L elbow , MAS 1+ in L wrist and finger flexors. Hypersensitivity to left hand improved, Left sided strength improved, decreased range of motion and pain at end  range of motion in left shoulder  Musculoskeletal: limited bilateral shoulder and elbow ROM due to increased tone ,right shoulder pain with PROM--stable 12/26     Assessment/Plan: 1. Functional deficits which require 3+ hours per day of interdisciplinary therapy in a comprehensive inpatient rehab setting. Physiatrist is providing close team supervision and 24 hour management of active medical problems listed below. Physiatrist and rehab team continue to assess barriers to discharge/monitor patient progress toward functional and medical goals  Care Tool:  Bathing    Body parts bathed by patient: Face, Abdomen, Chest, Left upper leg, Right upper leg, Left arm, Front perineal area, Right arm, Right lower leg, Left lower leg, Buttocks   Body parts bathed by helper: Buttocks Body parts n/a: Right arm, Left arm, Front perineal area, Buttocks, Right upper leg, Left upper leg, Right lower leg, Left lower leg   Bathing assist Assist Level: Contact Guard/Touching assist     Upper Body Dressing/Undressing Upper body dressing   What is the patient wearing?: Pull over shirt    Upper body assist Assist Level: Set up assist    Lower Body Dressing/Undressing Lower body dressing      What is the patient wearing?: Incontinence brief, Pants  Lower body assist Assist for lower body dressing: Minimal Assistance - Patient > 75%     Toileting Toileting Toileting Activity did not occur Press photographer and hygiene only): N/A (no void or bm)  Toileting assist Assist for toileting: Minimal Assistance - Patient > 75%     Transfers Chair/bed transfer  Transfers assist  Chair/bed transfer activity did not occur: Safety/medical concerns  Chair/bed transfer assist level: Contact Guard/Touching assist     Locomotion Ambulation   Ambulation assist   Ambulation activity did not occur: Safety/medical concerns  Assist level: Contact Guard/Touching assist Assistive device:  Walker-rolling Max distance: 150   Walk 10 feet activity   Assist  Walk 10 feet activity did not occur: Safety/medical concerns  Assist level: Contact Guard/Touching assist Assistive device: Walker-rolling   Walk 50 feet activity   Assist Walk 50 feet with 2 turns activity did not occur: Safety/medical concerns  Assist level: Contact Guard/Touching assist Assistive device: Walker-rolling    Walk 150 feet activity   Assist Walk 150 feet activity did not occur: Safety/medical concerns  Assist level: Contact Guard/Touching assist Assistive device: Walker-rolling    Walk 10 feet on uneven surface  activity   Assist Walk 10 feet on uneven surfaces activity did not occur: Safety/medical concerns   Assist level: Minimal Assistance - Patient > 75% Assistive device: Walker-rolling   Wheelchair     Assist Is the patient using a wheelchair?: Yes Type of Wheelchair: Manual    Wheelchair assist level: Moderate Assistance - Patient 50 - 74% Max wheelchair distance: 68ft    Wheelchair 50 feet with 2 turns activity    Assist    Wheelchair 50 feet with 2 turns activity did not occur: Safety/medical concerns   Assist Level: Supervision/Verbal cueing   Wheelchair 150 feet activity     Assist  Wheelchair 150 feet activity did not occur: Safety/medical concerns   Assist Level: Moderate Assistance - Patient 50 - 74%   Blood pressure (!) 107/54, pulse 83, temperature 98.2 F (36.8 C), resp. rate 16, height 5\' 1"  (1.549 m), weight 114 kg, SpO2 98%.  Medical Problem List and Plan: 1. 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  Awaiting approval of disability. Placement pending. Hoping that hospital will provide LOG for placement. Code status  DNR -Continue CIR therapies including PT, OT, and SLP   Team conference 12/4  Have discussed prognosis with mom, will discuss with patient  [name] pass ordered  2.  Impaired mobility: d/c lovenox since ambulating >150 feet, SCDs ordered  3. Knee pain: Provided list of foods for pain. Tylenol as needed, continue voltaren gel- changed to prn. XR reviewed and is stable, discussed with therapy -Right wrist swelling: XR ordered and shows no acute fracture, shows 2.55mm ulnar variance, brace removed to given patient a break from it. Voltaren gel ordered prn for discomfort, continue, CT ordered and show no acute osseus injury, continue ace wrap   -Foot pain: tramadol ordered prn, improved today, continue prn  -Headaches:continue tylenol and tramadol prn -Low back pain: continue kpad, asked [place] to please bring for her -Whole body hypersensitivity: gabapentin 100mg  TID ordered -Pain in left heel: XR obtained and  negative, cam boot ordered to help offload heel with therapy, discussed that this has helped -R shoulder pain: XR neg for acute etiology on 04/06/23, voltaren gel ordered--encourage ROM as tolerated  4. History of anxiety: discussed with her mom that she feels this was a big risk factor for her accident, propanolol d/ced due to hypotension. Continue magnesium supplement. Asked SW to place her on neuropsych list, Buspar ordered prn, discussed placing outpatient referral for behavioral health f/u  5. Neuropsych/cognition: This patient is not capable of making decisions on her own behalf  - 10/12: Still not capable of medical decision making; BUT oriented and communicating, following directions - MUCH improved - PRN melatonin 3mg    for insomnia  6. Skin: -Buttock/sacrum--foam dressing, pressure relief, nutrition -stage 3 left heel, continue foam dressing/ pressure  relief  7. Fluids/Electrolytes/Nutrition: Routine in and outs with follow-up chemistries  -Eating  well with cueing   -Dysphagia: continue D3/thins--tolerating this diet -Low protein stores: d/c prosource since patient hates these and is eating 100% of meals -Suboptimal vitamin D level: increase to 5,000 U daily. continue this dose  -History of magnesium deficiency: magnesium 500mg  stopped to support BP  -12/17 good po intake 8.  Cavitary right lower lobe pneumonia likely aspiration pneumonia/MRSA pneumonia.  Resolved    Latest Ref Rng & Units 05/02/2023    5:20 AM 04/18/2023    5:16 AM 04/04/2023    6:09 AM  CBC  WBC 4.0 - 10.5 K/uL 5.9  6.0  5.6   Hemoglobin 12.0 - 15.0 g/dL 82.9  56.2  13.0   Hematocrit 36.0 - 46.0 % 43.0  41.6  42.1   Platelets 150 - 400 K/uL 241  194  224      9.  History of drug abuse.  Positive cocaine on urine drug screen.  Provide counseling  10.  AKI/hypovolemia and ATN.  Resolved     Latest Ref Rng & Units 05/02/2023    5:20 AM 04/18/2023    5:16 AM 04/04/2023    6:09 AM  BMP  Glucose 70 - 99 mg/dL 89  79  91   BUN 6 - 20 mg/dL 14  17  20    Creatinine 0.44 - 1.00 mg/dL 8.65  7.84  6.96   Sodium 135 - 145 mmol/L 138  137  138   Potassium 3.5 - 5.1 mmol/L 4.0  3.8  3.9   Chloride 98 - 111 mmol/L 109  105  105   CO2 22 - 32 mmol/L 23  23  24    Calcium 8.9 - 10.3 mg/dL 8.9  9.0  9.3     11.  Mild transaminitis with rhabdomyolysis.  Both resolved  12.  Hypotension:  -12/21-22/24 BPs still soft end of normal but seems to be her baseline; monitor Vitals:   05/06/23 1439 05/06/23 2034 05/07/23 0548 05/07/23 1400  BP: 98/66 (!) 101/57 123/83 104/69   05/07/23 1936 05/08/23 0518 05/08/23 1300 05/08/23 1941  BP: 100/72 102/75 102/80 105/68   05/09/23 0315 05/09/23 1521 05/09/23 2014 05/10/23 0507  BP: 105/70 101/63 99/74 (!) 107/54     13. HTN: resolved   14. Impaired initiation: continue amantadine 100mg  BID>> decreased to QD  15.  Hyperglycemia: CBGs mildly elevated, d/ced checks. Prosource d/ced as per patient's request  16. Spasticity:  Continue wrist, elbow brace, and PRAFOs. Ordered a night splint for LLE  D/c baclofen and tizanidine- much improved  17.  Dyskinetic movements in head and neck - appears to be myoclonic  jerks related to hypoxic brain injury - as discussed with pt mom, no specific meds for this on antispasticity meds , consioder benzo although with pt's SUD would avoid this class - EEG reviewed and was negative for seizure.  -  resolved   18. Cervical extensor weakness: decrease tizanidine to 2mg  HS, decrease baclofen to 5mg  QHS, improved, d/c'd  19. Bowel and bladder incontinence: continue bowel and bladder program. D/c miralax, decrease tizanidine to 2mg  HS, d/c'd  20. Fatigue: resolved, discussed with team may be secondary to anti-spasticity medications, B12 reviewed and is 335, in suboptimal range, will give 1,000U B12 injection 8/7, will order monthly as needed, metanx started, increase to BID, continue, d/c tizanidine, discussed with patient, d/c baclofen   21. MRSA nares positive: negative on repeat, precautions d/ced  22. Tachycardic: resolved, d/c propanolol. Continue tizanidine 2mg  HS. Resolved, amantadine increased back to 100mg  BID, continue this dose, resolved  23. S/p fall: CT head reviewed and is stable  24. Bilateral foot drop: bilateral AFOs ordered, discussed that these can help her to walk better  25. Vaginal itching: completed fluconazole. Having some discharge now. See below  26. >100k E Coli UTI: 11/28 sensitive to amoxil, completed 1 week course  27. Poor sitting balance: continue OT/PT, improving  28. Headaches: topamax 55m daily prn ordered, continue  29. Drowsiness with tramadol: d/ced  30. IUD in place? Pt states she has IUD x42yrs  and believes it's supposed to be removed soon(chart review- Dr. Seymour Bars placed Mirena IUD on 07/01/19, good for 30yrs); needs outpatient  follow-up with OB/GYN  31. Constipation: d/c baclofen, last BM 12/18, milk of magnesia ordered 12/20, LBM 12/25, continue magnesium gluconate  32. History of drug addiction: tramadol d/ced as per mom's preference  33. Depression: discussed scheduling for outpatient psychology/psychiatry follow-up  34. Sinusitis: claritin ordered prn      LOS: 173 days A FACE TO FACE EVALUATION WAS PERFORMED  Nicole [name] 05/10/2023, 10:17 AM

## 2023-05-10 NOTE — Progress Notes (Signed)
Speech Language Pathology Daily Session Note  Patient Details  Name: Nicole [name] MRN: [id number] Date of Birth: Mar 01, 1976  Today's Date: 05/10/2023 SLP Individual Time: 1130-1158 SLP Individual Time Calculation (min): 28 min  Short Term Goals: Week 9: SLP Short Term Goal 1 (Week 9): Week 24 - Pt will solve mildly complex environmental problems with 50% accuracy given min assist. SLP Short Term Goal 2 (Week 9): Week 24 - Pt will recall basic daily information using external aids with 80% accuracy given mod assist. SLP Short Term Goal 3 (Week 9): Week 24 - Pt will utilize recommended handwriting strategies to achieve 80% legebility given mod verbal cues in 4/5 opportunities at the phrase and sentence levels. SLP Short Term Goal 4 (Week 9): Week 24 - Pt will utilize increased vocal intensity w/ min verbal cues to remain 90% intelligibile at conversation level in noisy environments in 4/5 opportunities.  Skilled Therapeutic Interventions: SLP conducted skilled therapy session targeting cognitive retraining goals. Patient recalled significant events that took place during therapist's time away for the holidays with 100% accuracy given supervision assist. Patient participated in mildly complex cognitive task and benefited from min to mod assist overall to provide answers. Patient continues to have much higher verbal cognitive ability than functional, and required min assist for basic organization of tote bag, thus SLP will plan to target cognition during more functional environmental tasks during upcoming sessions. Speech intelligibility throughout session 100%. Patient was left in lowered bed with call bell in reach and bed alarm set. SLP will continue to target goals per plan of care.       Pain Pain Assessment Pain Scale: 0-10 Pain Score: 0-No pain  Therapy/Group: Individual Therapy  [name], M.A., CCC-SLP  [name] 05/10/2023, 12:54 PM

## 2023-05-10 NOTE — Progress Notes (Signed)
Restless until 0100. Call to room to assist with voiding, patient already incontinent. Continent x 1, using urinal. LBM 12/25, continent. PRN buspar & melatonin given at HS. SCD's applied. Refused all braces. Complained of bilateral knee pain, PRN voltaren applied. Foam to sacrum. Nicole [name] A

## 2023-05-11 NOTE — Plan of Care (Signed)
  Problem: RH Balance Goal: LTG Patient will maintain dynamic sitting balance (PT) Description: LTG:  Patient will maintain dynamic sitting balance with assistance during mobility activities (PT) Flowsheets (Taken 05/11/2023 1822) LTG: Pt will maintain dynamic sitting balance during mobility activities with:: (goal upgraded based on pt's progress) Independent with assistive device  Note: goal upgraded based on pt's progress  Goal: LTG Patient will maintain dynamic standing balance (PT) Description: LTG:  Patient will maintain dynamic standing balance with assistance during mobility activities (PT) Flowsheets (Taken 05/11/2023 1822) LTG: Pt will maintain dynamic standing balance during mobility activities with:: (goal upgraded based on pt's progress) Supervision/Verbal cueing Note: goal upgraded based on pt's progress    Problem: Sit to Stand Goal: LTG:  Patient will perform sit to stand with assistance level (PT) Description: LTG:  Patient will perform sit to stand with assistance level (PT) Flowsheets (Taken 05/11/2023 1822) LTG: PT will perform sit to stand in preparation for functional mobility with assistance level: (goal upgraded based on pt's progress) Independent with assistive device Note: goal upgraded based on pt's progress    Problem: RH Bed Mobility Goal: LTG Patient will perform bed mobility with assist (PT) Description: LTG: Patient will perform bed mobility with assistance, with/without cues (PT). Flowsheets (Taken 05/11/2023 1822) LTG: Pt will perform bed mobility with assistance level of: (goal upgraded based on pt's progress) Independent with assistive device  Note: goal upgraded based on pt's progress    Problem: RH Bed to Chair Transfers Goal: LTG Patient will perform bed/chair transfers w/assist (PT) Description: LTG: Patient will perform bed to chair transfers with assistance (PT). Flowsheets (Taken 05/11/2023 1822) LTG: Pt will perform Bed to Chair Transfers with  assistance level: (goal upgraded based on pt's progress) Independent with assistive device  Note: goal upgraded based on pt's progress    Problem: RH Car Transfers Goal: LTG Patient will perform car transfers with assist (PT) Description: LTG: Patient will perform car transfers with assistance (PT). 05/11/2023 1825 by Ginny Forth, PT Flowsheets (Taken 05/11/2023 1825) LTG: Pt will perform car transfers with assist:: (reactivated goal and upgraded based on pt's progress) Supervision/Verbal cueing Note: reactivated goal and upgraded based on pt's progress  05/11/2023 1825 by Ginny Forth, PT Reactivated   Problem: RH Ambulation Goal: LTG Patient will ambulate in controlled environment (PT) Description: LTG: Patient will ambulate in a controlled environment, # of feet with assistance (PT). Flowsheets (Taken 05/11/2023 1825) LTG: Pt will ambulate in controlled environ  assist needed:: (goal upgraded based on pt's progress) Supervision/Verbal cueing LTG: Ambulation distance in controlled environment: 181ft using LRAD Note: goal upgraded based on pt's progress    Problem: RH Floor Transfers Goal: LTG Patient will perform floor transfers w/assist (PT) Description: LTG: Patient will perform floor transfers with assistance (PT). Flowsheets (Taken 05/11/2023 1829) LTG: PT WILL PERFORM FLOOR TRANFERS  WITH  ASSIST:: (goal added based on pt's progress) Contact Guard/Touching assist Note: goal added based on pt's progress    Problem: RH Stairs Goal: LTG Patient will ambulate up and down stairs w/assist (PT) Description: LTG: Patient will ambulate up and down # of stairs with assistance (PT) Flowsheets (Taken 05/11/2023 1829) LTG: Pt will ambulate up/down stairs assist needed:: (goal added based on pt's progress) Contact Guard/Touching assist LTG: Pt will  ambulate up and down number of stairs: 4 steps using HR Note: goal added based on pt's progress

## 2023-05-11 NOTE — Progress Notes (Signed)
Speech Language Pathology Weekly Progress Note  Patient Details  Name: [name] MRN: [id number] Date of Birth: [birth year]  Beginning of progress report period: May 04, 2023 End of progress report period: May 11, 2023  Short Term Goals: Week 9: SLP Short Term Goal 1 (Week 9): Week 24 - Pt will solve mildly complex environmental problems with 50% accuracy given min assist. SLP Short Term Goal 1 - Progress (Week 9): Met SLP Short Term Goal 2 (Week 9): Week 24 - Pt will recall basic daily information using external aids with 80% accuracy given mod assist. SLP Short Term Goal 2 - Progress (Week 9): Met SLP Short Term Goal 3 (Week 9): Week 24 - Pt will utilize recommended handwriting strategies to achieve 80% legebility given mod verbal cues in 4/5 opportunities at the phrase and sentence levels. SLP Short Term Goal 3 - Progress (Week 9): Met SLP Short Term Goal 4 (Week 9): Week 24 - Pt will utilize increased vocal intensity w/ min verbal cues to remain 90% intelligibile at conversation level in noisy environments in 4/5 opportunities. SLP Short Term Goal 4 - Progress (Week 9): Not met  New Short Term Goals: Week 9: SLP Short Term Goal 1 (Week 9): Week 25 - Pt will solve mildly complex environmental problems with 60% accuracy given min assist. SLP Short Term Goal 2 (Week 9): Week 25 - Pt will recall basic daily information using external aids with 90% accuracy given mod assist. SLP Short Term Goal 3 (Week 9): Week 25 - Pt will utilize recommended handwriting strategies to achieve 90% legebility given mod verbal cues in 4/5 opportunities at the phrase and sentence levels. SLP Short Term Goal 4 (Week 9): Week 25 - Pt will utilize increased vocal intensity w/ min verbal cues to remain 90% intelligibile at conversation level in noisy environments in 4/5 opportunities.  Weekly Progress Updates: Patient continues to make excellent strides towards therapy goals, meeting 3/4 short term goals  set this reporting period. Patient currently requires mod assist to utilize handwriting strategies to achieve 80% readability at the phrase and short sentence levels. She requires min assist to achieve 50% accuracy on mildly complex problem solving tasks, though functional problem solving is more impaired than verbal, thus this will be an area of continued work for upcoming therapy sessions. Patient requires mod verbal cues to remain 90% intelligible in noisy environments and recalls basic daily information using external aids with 80% accuracy and mod assist. Patient and family education ongoing. Patient will continue to benefit from skilled therapy services during remainder of CIR stay.    Intensity: Minumum of 1-2 x/day, 30 to 90 minutes Frequency: 3 to 5 out of 7 days Duration/Length of Stay: TBD due to SNF placement Treatment/Interventions: Cognitive remediation/compensation;Internal/external aids;Speech/Language facilitation;Cueing hierarchy;Environmental controls;Therapeutic Activities;Functional tasks;Multimodal communication approach;Patient/family education;Therapeutic Exercise  [name], M.A., CCC-SLP  [name] 05/11/2023, 12:30 PM

## 2023-05-11 NOTE — Progress Notes (Signed)
Physical Therapy Weekly Progress Note  Patient Details  Name: Nicole [name] MRN: [id number] Date of Birth: 1976/04/04  Beginning of progress report period: May 03, 2023 End of progress report period: May 11, 2023  Today's Date: 05/11/2023 PT Individual Time: 4098-1191 PT Individual Time Calculation (min): 68 min   Patient has met 3 of 3 short term goals. Ms. Buri continues to make good progress with therapy, demonstrating increasing independence with functional mobility. She continues to have greatest impairments of abnormal tone impacting functional mobility and movement patterns, B LE (L>R) weakness with poor L LE foot clearance during gait and knee hyperextension during stance, and all of this impacting her balance and ability to prevent falls. She is performing supine<>sit with CGA, sit<>stands and stand pivot transfers using RW with supervision, ambulating up to ~161ft using RW with CGA, and navigating stairs using HRs with min A. She is progressing towards more dynamic gait training without use of AD to improve her balance and independence. She will benefit from continued CIR level skilled physical therapy to further progress her independence until D/C.  Patient continues to demonstrate the following deficits muscle weakness and muscle joint tightness, decreased cardiorespiratoy endurance, impaired timing and sequencing, abnormal tone, unbalanced muscle activation, and decreased coordination,  , decreased awareness, decreased problem solving, decreased safety awareness, and decreased memory, and decreased standing balance, decreased postural control, and decreased balance strategies and therefore will continue to benefit from skilled PT intervention to increase functional independence with mobility.  Patient progressing toward long term goals. Upgraded goals based on pt's progress, see goal revision.  Continue plan of care.  PT Short Term Goals Week 1:  PT Short Term Goal 1 (Week 1):  Pt will complete bed mobility with CGA PT Short Term Goal 1 - Progress (Week 1): Met PT Short Term Goal 2 (Week 1): Pt will ambulate 171ft with CGA and LRAD PT Short Term Goal 2 - Progress (Week 1): Met PT Short Term Goal 3 (Week 1): Pt will complete functional outcome measure to assess falls risk PT Short Term Goal 3 - Progress (Week 1): Met Next week goals:  Week 1:  PT Short Term Goal 1 (Week 1): Pt will perform supine<>sit with supervision PT Short Term Goal 1 - Progress (Week 1): Other (comment) (new goal) PT Short Term Goal 2 (Week 1): Pt will ambulate at least 129ft with no more than min A, not using an AD. PT Short Term Goal 2 - Progress (Week 1): Other (comment) (new goal) PT Short Term Goal 3 (Week 1): Pt will demonstrate decreased fall risk as noted by an improvement of at least 7 points on the [employer] Test PT Short Term Goal 3 - Progress (Week 1): Other (comment) (new goal) PT Short Term Goal 4 (Week 1): Pt will navigate 4 steps using HRs with no more than CGA  Skilled Therapeutic Interventions/Progress Updates:    Pt received sitting in w/c and eager to participate in therapy session. Sit<>stands using RW with SBA for safety during session. Gait training ~144ft to main therapy gym using RW with CGA for safety - therapist providing cuing to maintain upright posture with forward gaze (rather than excessive neck flexion and downward gaze) - therapist providing facilitation to maintain forward, continuous movement of AD rather than pausing after each step with AD or inconsistent forward movement of AD - noticed pt having poor L LE foot clearance with lack of ankle DF and towards end with fatigue starting to catch L  toes on ground ~3x - noticed L knee slightly snapping into hyperextension during stance.   Patient participated in [employer] and demonstrates increased fall risk as noted by score of 28/56; however, this is a significant improvement from 13/56 only 2 weeks prior.  (<36= high risk for falls, close to 100%; 37-45 significant >80%; 46-51 moderate >50%; 52-55 lower >25%). Noticed pt should continue addressing L hip flexion strengthening, trunk rotation with focus on rotating towards R, turning around, and narrow BOS.  Gait training ~181ft x2 (seated break), no AD, with min progressed to mod assist for balance and facilitating improved L hip extension during stance phase while facilitating trunk extension - noticed pt with strong R anterior trunk lean during L stance phase with poor L hip stability, continues to have decreased L LE foot clearance during swing - noticed at one point pt reached up to touch her head with R hand during gait and that temporarily improved L hip stability during stance via helping keep trunk extended, but inconsistent towards end with fatigue (cued pt to hold her R hand up on her head intermittently when having excessive forward trunk flexion).  Donned L LE [place] Sprystep PLS AFO (no other PLS fitting her at this time) Gait training ~61ft, no AD, with min A as described above transitioned to ~52ft using RW with CGA - improved L LE foot clearance, but continues to have L knee hyperextending during stance (this brace is slightly too short to provide posterior knee support during stance)   Gait training ~159ft back to her room using RW and wearing L LE PLS AFO and pt demos slight improvement in L foot clearance during swing; however, pt reports the brace is rubbing back of L heel and is causing pain so the gait improvements deteriorate due to onset of pain. Therapist removed brace and assessed skin to be intact with no redness noted (only a very small scab from a prior wound heeling). Anticipated pt with just increased sensitivity to touch in this area.  At end of session, pt left seated in w/c with needs in reach, seat belt alarm on, and her mother present.   Therapy Documentation Precautions:  Precautions Precautions: Fall Precaution  Comments: decreased stand balance- reliant on UE, Pain in LLE limits mobility at times Required Braces or Orthoses: Other Brace Other Brace: B adjustable night splints; B elbow "cosey" splints; L palm protector; R mitt Restrictions Weight Bearing Restrictions Per Provider Order: No   Pain:  Only pain was when wearing PLS AFO during gait - removed brace and pain alleviated.  Balance: Standardized Balance Assessment Standardized Balance Assessment: Berg Balance Test Berg Balance Test Sit to Stand: Able to stand  independently using hands Standing Unsupported: Able to stand 2 minutes with supervision (has R lateral lean throughout biasing WBing through R LE) Sitting with Back Unsupported but Feet Supported on Floor or Stool: Able to sit safely and securely 2 minutes Stand to Sit: Controls descent by using hands Transfers: Able to transfer with verbal cueing and /or supervision Standing Unsupported with Eyes Closed: Able to stand 10 seconds with supervision Standing Ubsupported with Feet Together: Able to place feet together independently and stand for 1 minute with supervision (minor postural sway in all directions) From Standing, Reach Forward with Outstretched Arm: Reaches forward but needs supervision From Standing Position, Pick up Object from Floor: Able to pick up shoe, needs supervision From Standing Position, Turn to Look Behind Over each Shoulder: Needs supervision when turning (has  trunk rotation towards L when standing statically and therefore rotates towards L more than towards R) Turn 360 Degrees: Needs close supervision or verbal cueing (turns slowly and swirls/pivots on one foot rather than picking her feet up and stepping them) Standing Unsupported, Alternately Place Feet on Step/Stool: Needs assistance to keep from falling or unable to try Standing Unsupported, One Foot in Front: Needs help to step but can hold 15 seconds Standing on One Leg: Unable to try or needs assist to  prevent fall Total Score: 28   Therapy/Group: Individual Therapy  Ginny Forth , PT, DPT, NCS, CSRS 05/11/2023, 3:32 PM

## 2023-05-11 NOTE — Progress Notes (Signed)
Patient's BP was 87/80, patient stable, and she denied any symptom, on-call notified, ordered to repeat BP, BP repeated and came up to 99/62. We will continue to monitor.

## 2023-05-11 NOTE — Plan of Care (Signed)
  Problem: RH BOWEL ELIMINATION Goal: RH STG MANAGE BOWEL WITH ASSISTANCE Description: STG Manage Bowel with mod I Assistance. Outcome: Progressing   Problem: RH BLADDER ELIMINATION Goal: RH STG MANAGE BLADDER WITH ASSISTANCE Description: STG Manage Bladder With toileting Assistance Outcome: Progressing   Problem: RH SKIN INTEGRITY Goal: RH STG MAINTAIN SKIN INTEGRITY WITH ASSISTANCE Description: STG Maintain Skin Integrity With min  Assistance. Outcome: Progressing

## 2023-05-11 NOTE — Progress Notes (Signed)
Speech Language Pathology Daily Session Note  Patient Details  Name: FLORICE PHILIPP MRN: [id number] Date of Birth: [birth year]  Today's Date: 05/11/2023 SLP Individual Time: 5621-3086 SLP Individual Time Calculation (min): 58 min  Short Term Goals: Week 9: SLP Short Term Goal 1 (Week 9): Week 25 - Pt will solve mildly complex environmental problems with 60% accuracy given min assist. SLP Short Term Goal 2 (Week 9): Week 25 - Pt will recall basic daily information using external aids with 90% accuracy given mod assist. SLP Short Term Goal 3 (Week 9): Week 25 - Pt will utilize recommended handwriting strategies to achieve 90% legebility given mod verbal cues in 4/5 opportunities at the phrase and sentence levels. SLP Short Term Goal 4 (Week 9): Week 25 - Pt will utilize increased vocal intensity w/ min verbal cues to remain 90% intelligibile at conversation level in noisy environments in 4/5 opportunities.  Skilled Therapeutic Interventions:  Patient was seen in am to address cognitive re- training. Pt was alert and seated upright in WC at sink brushing teeth upon SLP arrival. She was agreeable for session. SLP provided assistance in completion of lower body dressing. Impulsivity and decreased safety awareness noted throughout task as pt stood and attempted to walk without assist and navigated walker across shoes in middle of floor. Pt responsive to redirection. Once dressed pt transferred to hospital atrium for completion of session. SLP engaged pt in conversational exchange while addressing problem solving. Pt ~80% intelligible in a nosy environment with poor error awareness and self correcting. With min to mod cues pt able to elevate vocal intensity for 90% intelligibility. Pt challenged in solving daily living, medication, and financial situations given a verbal scenario. Pt completed task with 60% acc with mod cues. In other minutes of session, SLP addressed legible handwriting. Pt requiring max cues  to slow rate. SLP initiated pre established breaks to facilitate spacing and for pacing with improved legibility to 80%. Pt returned to her room where chair alarm was activated and call button placed within reach. SLP to continue POC.   Pain Pain Assessment Pain Scale: 0-10 Pain Score: 0-No pain  Therapy/Group: Individual Therapy  [name] 05/11/2023, 2:18 PM

## 2023-05-11 NOTE — Progress Notes (Signed)
PROGRESS NOTE   Subjective/Complaints: No new complaints this morning BP soft yesterday but asymptomatic, will d/c amantadine since no longer having issues with intitiation  ROS: Patient denies fever, rash, sore throat, blurred vision, dizziness, nausea, vomiting, diarrhea, cough, shortness of breath or chest pain, joint or back/neck pain, headache, or mood change. +anxiety and depression- improved, improved initiation    Objective:   No results found.  No results for input(s): "WBC", "HGB", "HCT", "PLT" in the last 72 hours.        No results for input(s): "NA", "K", "CL", "CO2", "GLUCOSE", "BUN", "CREATININE", "CALCIUM" in the last 72 hours.         Intake/Output Summary (Last 24 hours) at 05/11/2023 1157 Last data filed at 05/11/2023 0854 Gross per 24 hour  Intake 960 ml  Output --  Net 960 ml           Physical Exam: Vital Signs Blood pressure (!) 148/61, pulse (!) 56, temperature 97.8 F (36.6 C), resp. rate 18, height 5\' 1"  (1.549 m), weight 114 kg, SpO2 95%.   General: No acute distress, sitting in w/c writing on a poster/large paper HEENT: NCAT, EOMI, oral membranes moist Cards: Bradycardia Chest: normal effort, CTAB Abdomen: Soft, NT, ND, +BS throughout Skin: dry, intact Extremities: no edema Psych: pleasant and appropriate , reports anxiety and depression Skin: + Scaling dried skin on bilateral heels left buttock unstageable - not examined + Left heel stage III DTI has resolved Neuro: memory seeming to improve lately Neurologic: AAOx2, fair insight and awareness..  + Hypophonation, speech intelligability much improved. Fair insight. MAS 1 right elbow,MAS  tr to 1 in RIght finger and wrist flexors  MAS 1 in extensors of LE's MAS 1 L elbow , MAS 1+ in L wrist and finger flexors. Hypersensitivity to left hand improved, Left sided strength improved, decreased range of motion and pain at  end range of motion in left shoulder  Musculoskeletal: limited bilateral shoulder and elbow ROM due to increased tone ,right shoulder pain with PROM--stable 12/26     Assessment/Plan: 1. Functional deficits which require 3+ hours per day of interdisciplinary therapy in a comprehensive inpatient rehab setting. Physiatrist is providing close team supervision and 24 hour management of active medical problems listed below. Physiatrist and rehab team continue to assess barriers to discharge/monitor patient progress toward functional and medical goals  Care Tool:  Bathing    Body parts bathed by patient: Face, Abdomen, Chest, Left upper leg, Right upper leg, Left arm, Front perineal area, Right arm, Right lower leg, Left lower leg, Buttocks   Body parts bathed by helper: Buttocks Body parts n/a: Right arm, Left arm, Front perineal area, Buttocks, Right upper leg, Left upper leg, Right lower leg, Left lower leg   Bathing assist Assist Level: Contact Guard/Touching assist     Upper Body Dressing/Undressing Upper body dressing   What is the patient wearing?: Pull over shirt    Upper body assist Assist Level: Set up assist    Lower Body Dressing/Undressing Lower body dressing      What is the patient wearing?: Incontinence brief, Pants     Lower body assist Assist for lower body  dressing: Minimal Assistance - Patient > 75%     Toileting Toileting Toileting Activity did not occur Press photographer and hygiene only): N/A (no void or bm)  Toileting assist Assist for toileting: Minimal Assistance - Patient > 75%     Transfers Chair/bed transfer  Transfers assist  Chair/bed transfer activity did not occur: Safety/medical concerns  Chair/bed transfer assist level: Contact Guard/Touching assist     Locomotion Ambulation   Ambulation assist   Ambulation activity did not occur: Safety/medical concerns  Assist level: Contact Guard/Touching assist Assistive device:  Walker-rolling Max distance: 150   Walk 10 feet activity   Assist  Walk 10 feet activity did not occur: Safety/medical concerns  Assist level: Contact Guard/Touching assist Assistive device: Walker-rolling   Walk 50 feet activity   Assist Walk 50 feet with 2 turns activity did not occur: Safety/medical concerns  Assist level: Contact Guard/Touching assist Assistive device: Walker-rolling    Walk 150 feet activity   Assist Walk 150 feet activity did not occur: Safety/medical concerns  Assist level: Contact Guard/Touching assist Assistive device: Walker-rolling    Walk 10 feet on uneven surface  activity   Assist Walk 10 feet on uneven surfaces activity did not occur: Safety/medical concerns   Assist level: Minimal Assistance - Patient > 75% Assistive device: Walker-rolling   Wheelchair     Assist Is the patient using a wheelchair?: Yes Type of Wheelchair: Manual    Wheelchair assist level: Moderate Assistance - Patient 50 - 74% Max wheelchair distance: 46ft    Wheelchair 50 feet with 2 turns activity    Assist    Wheelchair 50 feet with 2 turns activity did not occur: Safety/medical concerns   Assist Level: Supervision/Verbal cueing   Wheelchair 150 feet activity     Assist  Wheelchair 150 feet activity did not occur: Safety/medical concerns   Assist Level: Moderate Assistance - Patient 50 - 74%   Blood pressure (!) 148/61, pulse (!) 56, temperature 97.8 F (36.6 C), resp. rate 18, height 5\' 1"  (1.549 m), weight 114 kg, SpO2 95%.  Medical Problem List and Plan: 1. 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 Awaiting approval of disability. Placement pending. Hoping that hospital will provide LOG for placement. Code status  DNR Continue CIR therapies including PT, OT, and SLP   Team conference 12/4  Have discussed prognosis with mom, will discuss with patient  [name] pass ordered  2.  Impaired mobility: d/c lovenox since ambulating >150 feet, SCDs ordered  3. Knee pain: Provided list of foods for pain. Tylenol as needed, continue voltaren gel- changed to prn. XR reviewed and is stable, discussed with therapy -Right wrist swelling: XR ordered and shows no acute fracture, shows 2.78mm ulnar variance, brace removed to given patient a break from it. Voltaren gel ordered prn for discomfort, continue, CT ordered and show no acute osseus injury, continue ace wrap   -Foot pain: tramadol ordered prn, improved today, continue prn  -Headaches:continue tylenol and tramadol prn -Low back pain: continue kpad, asked [place] to please bring for her -Whole body hypersensitivity: gabapentin 100mg  TID ordered -Pain in left heel: XR obtained and negative, cam boot ordered to help  offload heel with therapy, discussed that this has helped -R shoulder pain: XR neg for acute etiology on 04/06/23, voltaren gel ordered--encourage ROM as tolerated  4. History of anxiety: discussed with her mom that she feels this was a big risk factor for her accident, propanolol d/ced due to hypotension. Continue magnesium supplement. Asked SW to place her on neuropsych list, Buspar ordered prn, discussed placing outpatient referral for behavioral health f/u  5. Neuropsych/cognition: This patient is not capable of making decisions on her own behalf  - 10/12: Still not capable of medical decision making; BUT oriented and communicating, following directions - MUCH improved - PRN melatonin 3mg    for insomnia  6. Skin: -Buttock/sacrum--foam dressing, pressure relief, nutrition -stage 3 left heel, continue foam dressing/ pressure  relief  7. Fluids/Electrolytes/Nutrition: Routine in and outs with follow-up chemistries  -Eating  well with cueing   -Dysphagia: continue D3/thins--tolerating this diet -Low protein stores: d/c prosource since patient hates these and is eating 100% of meals -Suboptimal vitamin D level: increase to 5,000 U daily. continue this dose  -History of magnesium deficiency: magnesium 500mg  stopped to support BP  -12/17 good po intake 8.  Cavitary right lower lobe pneumonia likely aspiration pneumonia/MRSA pneumonia.  Resolved    Latest Ref Rng & Units 05/02/2023    5:20 AM 04/18/2023    5:16 AM 04/04/2023    6:09 AM  CBC  WBC 4.0 - 10.5 K/uL 5.9  6.0  5.6   Hemoglobin 12.0 - 15.0 g/dL 11.9  14.7  82.9   Hematocrit 36.0 - 46.0 % 43.0  41.6  42.1   Platelets 150 - 400 K/uL 241  194  224      9.  History of drug abuse.  Positive cocaine on urine drug screen.  Provide counseling  10.  AKI/hypovolemia and ATN.  Resolved     Latest Ref Rng & Units 05/02/2023    5:20 AM 04/18/2023    5:16 AM 04/04/2023    6:09 AM  BMP  Glucose 70 - 99 mg/dL 89  79  91   BUN 6 - 20 mg/dL 14  17  20    Creatinine 0.44 - 1.00 mg/dL 5.62  1.30  8.65   Sodium 135 - 145 mmol/L 138  137  138   Potassium 3.5 - 5.1 mmol/L 4.0  3.8  3.9   Chloride 98 - 111 mmol/L 109  105  105   CO2 22 - 32 mmol/L 23  23  24    Calcium 8.9 - 10.3 mg/dL 8.9  9.0  9.3     11.  Mild transaminitis with rhabdomyolysis.  Both resolved  12.  Hypotension:  D/c amantadine  13. HTN: resolved   14. Impaired initiation: resolved, d/c amantadine  15. Hyperglycemia: CBGs mildly elevated, d/ced checks. Prosource d/ced as per patient's request  16. Spasticity:  Continue wrist, elbow brace, and PRAFOs. Ordered a night splint for LLE  D/c baclofen and tizanidine- much improved  17.  Dyskinetic movements in head and neck - appears to be myoclonic jerks related to hypoxic brain injury - as discussed with pt mom, no specific meds for this on  antispasticity meds , consioder benzo although with pt's SUD would avoid this class - EEG reviewed and was negative for seizure.  -  resolved   18. Cervical extensor weakness: decrease tizanidine to 2mg  HS, decrease baclofen to 5mg  QHS, improved, d/c'd  19. Bowel and bladder incontinence: continue bowel and bladder program. D/c miralax, decrease tizanidine  to 2mg  HS, d/c'd  20. Fatigue: resolved, discussed with team may be secondary to anti-spasticity medications, B12 reviewed and is 335, in suboptimal range, will give 1,000U B12 injection 8/7, will order monthly as needed, metanx started, increase to BID, continue, d/c tizanidine, discussed with patient, d/c baclofen   21. MRSA nares positive: negative on repeat, precautions d/ced  22. Tachycardic: resolved, d/c propanolol. Continue tizanidine 2mg  HS. Resolved, amantadine increased back to 100mg  BID, continue this dose, resolved  23. S/p fall: CT head reviewed and is stable  24. Bilateral foot drop: bilateral AFOs ordered, discussed that these can help her to walk better  25. Vaginal itching: completed fluconazole. Having some discharge now. See below  26. >100k E Coli UTI: 11/28 sensitive to amoxil, completed 1 week course  27. Poor sitting balance: continue OT/PT, improving  28. Headaches: topamax 57m daily prn ordered, continue  29. Drowsiness with tramadol: d/ced  30. IUD in place? Pt states she has IUD x51yrs  and believes it's supposed to be removed soon(chart review- Dr. Seymour Bars placed Mirena IUD on 07/01/19, good for 90yrs); needs outpatient follow-up with OB/GYN  31. Constipation: d/c baclofen, last BM 12/18, milk of magnesia ordered 12/20, LBM 12/25, continue magnesium gluconate  32. History of drug addiction: tramadol d/ced as per mom's preference  33. Depression: discussed scheduling for outpatient psychology/psychiatry follow-up, discussed neuropsych f/u while in hospital  34. Sinusitis: claritin ordered prn, continue        LOS: 174 days A FACE TO FACE EVALUATION WAS PERFORMED  Drema Pry Georgana Romain 05/11/2023, 11:57 AM

## 2023-05-11 NOTE — Progress Notes (Signed)
Occupational Therapy Session Note  Patient Details  Name: Nicole [name] MRN: [id number] Date of Birth: 1976/02/05  Today's Date: 05/11/2023 OT Individual Time: 4098-1191 OT Individual Time Calculation (min): 83 min    Short Term Goals: Week 4:  OT Short Term Goal 1 (Week 4): Patient will motor plan to transition from supine to sitting from flat surface with min assist OT Short Term Goal 1 - Progress (Week 4): Other (comment) OT Short Term Goal 2 (Week 4): Patient will OT Short Term Goal 2 - Progress (Week 4): Other (comment) OT Short Term Goal 3 (Week 4): Patient will don left sock with min assist OT Short Term Goal 3 - Progress (Week 4): Other (comment) OT Short Term Goal 4 (Week 4): Patient will trial wearing underwear during weekday x 2 OT Short Term Goal 4 - Progress (Week 4): Other (comment) OT Short Term Goal 5 (Week 4): Patient will grab hair to place in ponytail with BUE OT Short Term Goal 5 - Progress (Week 4): Other (comment)  Skilled Therapeutic Interventions/Progress Updates:    Patient received dressed and up in wheelchair.  Assisted patient to make a call to order coffee- patient dialing incorrectly multiple times, and needed cueing to check number and redial.  Once patient located correct number - able to dial and order coffee.   Transported to gym to address neuromuscular reeducation for transitional movements.  Worked on motor planning with less cueing and facilitation sit to supine, supine to sit, supine to prone, to quadruped.  Patient needs increased time and min to occasional mod assist depending on her set up.  Worked on lateral trunk flexion and extension, as well as rotation in quadruped and while seated on physioball.  Working on improving left (and right) glenohumeral joint motion, and trunk extension - reducing forward head position.   Returned to room and assisted patient to walk to bathroom without walker - facilitating upright posture and weight shift onto LLE.   Patient returned to bed at end of session as she has a long break before PT session.  Bed alarm engaged and call bell/ personal items in reach.    Therapy Documentation Precautions:  Precautions Precautions: Fall Precaution Comments: decreased stand balance- reliant on UE, Pain in LLE limits mobility at times Required Braces or Orthoses: Other Brace Other Brace: B adjustable night splints; B elbow "cosey" splints; L palm protector; R mitt Restrictions Weight Bearing Restrictions Per Provider Order: No   Pain: Pain Assessment Pain Scale: 0-10 Pain Score: 0-No pain        Therapy/Group: Individual Therapy  [name] 05/11/2023, 12:09 PM

## 2023-05-11 NOTE — Plan of Care (Signed)
  Problem: Consults Goal: RH STROKE PATIENT EDUCATION Description: See Patient Education module for education specifics  Outcome: Progressing   Problem: RH BOWEL ELIMINATION Goal: RH STG MANAGE BOWEL WITH ASSISTANCE Description: STG Manage Bowel with mod I Assistance. Outcome: Progressing Goal: RH STG MANAGE BOWEL W/MEDICATION W/ASSISTANCE Description: STG Manage Bowel with Medication with mod I Assistance. Outcome: Progressing   Problem: RH BLADDER ELIMINATION Goal: RH STG MANAGE BLADDER WITH ASSISTANCE Description: STG Manage Bladder With toileting Assistance Outcome: Progressing   Problem: RH SKIN INTEGRITY Goal: RH STG MAINTAIN SKIN INTEGRITY WITH ASSISTANCE Description: STG Maintain Skin Integrity With min  Assistance. Outcome: Progressing

## 2023-05-12 LAB — URINALYSIS, ROUTINE W REFLEX MICROSCOPIC
Bilirubin Urine: NEGATIVE
Glucose, UA: NEGATIVE mg/dL
Hgb urine dipstick: NEGATIVE
Ketones, ur: NEGATIVE mg/dL
Leukocytes,Ua: NEGATIVE
Nitrite: NEGATIVE
Protein, ur: NEGATIVE mg/dL
Specific Gravity, Urine: 1.006 (ref 1.005–1.030)
pH: 7 (ref 5.0–8.0)

## 2023-05-12 NOTE — Plan of Care (Signed)
  Problem: RH BOWEL ELIMINATION Goal: RH STG MANAGE BOWEL WITH ASSISTANCE Description: STG Manage Bowel with mod I Assistance. Outcome: Progressing   Problem: RH BLADDER ELIMINATION Goal: RH STG MANAGE BLADDER WITH ASSISTANCE Description: STG Manage Bladder With toileting Assistance Outcome: Progressing   Problem: RH SKIN INTEGRITY Goal: RH STG MAINTAIN SKIN INTEGRITY WITH ASSISTANCE Description: STG Maintain Skin Integrity With min  Assistance. Outcome: Progressing

## 2023-05-12 NOTE — Progress Notes (Signed)
PROGRESS NOTE   Subjective/Complaints:  Pt doing ok today, having some back and leg pain so wants her voltaren gel applied. Slept "awesome" last night! LBM yesterday.  Urinating ok, but states she's having some straining and dysuria for a couple days, thinks she's staying in her wet diapers too long and that's causing her recurrent UTIs. Will check U/A. Otherwise denies any other complaints or concerns today.   ROS: Patient denies fever, CP, SOB, abd pain, nausea, vomiting, diarrhea. +anxiety and depression- improved, improved initiation    Objective:   No results found.  No results for input(s): "WBC", "HGB", "HCT", "PLT" in the last 72 hours.        No results for input(s): "NA", "K", "CL", "CO2", "GLUCOSE", "BUN", "CREATININE", "CALCIUM" in the last 72 hours.         Intake/Output Summary (Last 24 hours) at 05/12/2023 1124 Last data filed at 05/12/2023 0800 Gross per 24 hour  Intake 480 ml  Output --  Net 480 ml           Physical Exam: Vital Signs Blood pressure 92/66, pulse 80, temperature 97.9 F (36.6 C), resp. rate 17, height 5\' 1"  (1.549 m), weight 114 kg, SpO2 97%.   General: No acute distress, laying in bed HEENT: NCAT, EOMI, oral membranes moist Cards: RRR, no m/r/g Chest: normal effort, CTAB Abdomen: Soft, ND, +BS throughout, very minimal suprapubic TTP Skin: dry, intact Extremities: no edema Psych: pleasant and appropriate  PRIOR EXAMS: Skin: + Scaling dried skin on bilateral heels left buttock unstageable - not examined + Left heel stage III DTI has resolved Neuro: memory seeming to improve lately Neurologic: AAOx2, fair insight and awareness..  + Hypophonation, speech intelligability much improved. Fair insight. MAS 1 right elbow,MAS  tr to 1 in RIght finger and wrist flexors  MAS 1 in extensors of LE's MAS 1 L elbow , MAS 1+ in L wrist and finger flexors. Hypersensitivity  to left hand improved, Left sided strength improved, decreased range of motion and pain at end range of motion in left shoulder  Musculoskeletal: limited bilateral shoulder and elbow ROM due to increased tone ,right shoulder pain with PROM--stable 12/26     Assessment/Plan: 1. Functional deficits which require 3+ hours per day of interdisciplinary therapy in a comprehensive inpatient rehab setting. Physiatrist is providing close team supervision and 24 hour management of active medical problems listed below. Physiatrist and rehab team continue to assess barriers to discharge/monitor patient progress toward functional and medical goals  Care Tool:  Bathing    Body parts bathed by patient: Face, Abdomen, Chest, Left upper leg, Right upper leg, Left arm, Front perineal area, Right arm, Right lower leg, Left lower leg, Buttocks   Body parts bathed by helper: Buttocks Body parts n/a: Right arm, Left arm, Front perineal area, Buttocks, Right upper leg, Left upper leg, Right lower leg, Left lower leg   Bathing assist Assist Level: Contact Guard/Touching assist     Upper Body Dressing/Undressing Upper body dressing   What is the patient wearing?: Pull over shirt    Upper body assist Assist Level: Set up assist    Lower Body Dressing/Undressing Lower body dressing  What is the patient wearing?: Incontinence brief, Pants     Lower body assist Assist for lower body dressing: Minimal Assistance - Patient > 75%     Toileting Toileting Toileting Activity did not occur (Clothing management and hygiene only): N/A (no void or bm)  Toileting assist Assist for toileting: Minimal Assistance - Patient > 75%     Transfers Chair/bed transfer  Transfers assist  Chair/bed transfer activity did not occur: Safety/medical concerns  Chair/bed transfer assist level: Supervision/Verbal cueing Chair/bed transfer assistive device: Geologist, engineering   Ambulation assist    Ambulation activity did not occur: Safety/medical concerns  Assist level: Contact Guard/Touching assist Assistive device: Walker-rolling Max distance: 150   Walk 10 feet activity   Assist  Walk 10 feet activity did not occur: Safety/medical concerns  Assist level: Contact Guard/Touching assist Assistive device: Walker-rolling   Walk 50 feet activity   Assist Walk 50 feet with 2 turns activity did not occur: Safety/medical concerns  Assist level: Contact Guard/Touching assist Assistive device: Walker-rolling    Walk 150 feet activity   Assist Walk 150 feet activity did not occur: Safety/medical concerns  Assist level: Contact Guard/Touching assist Assistive device: Walker-rolling    Walk 10 feet on uneven surface  activity   Assist Walk 10 feet on uneven surfaces activity did not occur: Safety/medical concerns   Assist level: Minimal Assistance - Patient > 75% Assistive device: Walker-rolling   Wheelchair     Assist Is the patient using a wheelchair?: No Type of Wheelchair: Manual    Wheelchair assist level: Moderate Assistance - Patient 50 - 74% Max wheelchair distance: 34ft    Wheelchair 50 feet with 2 turns activity    Assist    Wheelchair 50 feet with 2 turns activity did not occur: Safety/medical concerns   Assist Level: Supervision/Verbal cueing   Wheelchair 150 feet activity     Assist  Wheelchair 150 feet activity did not occur: Safety/medical concerns   Assist Level: Moderate Assistance - Patient 50 - 74%   Blood pressure 92/66, pulse 80, temperature 97.9 F (36.6 C), resp. rate 17, height 5\' 1"  (1.549 m), weight 114 kg, SpO2 97%.  Medical Problem List and Plan: 1. 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 Awaiting approval of disability. Placement pending. Hoping that hospital will provide LOG for placement. Code status  DNR Continue CIR therapies including PT, OT, and SLP   Team conference 12/4  Have discussed prognosis with mom, will discuss with patient  [name] pass ordered  2.  Impaired mobility: d/c lovenox since ambulating >150 feet, SCDs ordered  3. Knee pain: Provided list of foods for pain. Tylenol as needed, continue voltaren gel- changed to prn. XR reviewed and is stable, discussed with therapy -Right wrist swelling: XR ordered and shows no acute fracture, shows 2.23mm ulnar variance, brace removed to given patient a break from it. Voltaren gel ordered prn for discomfort, continue, CT ordered and show no acute osseus injury, continue ace wrap   -Foot pain: tramadol ordered prn, improved today, continue prn  -Headaches:continue tylenol and tramadol prn -Low back pain: continue kpad, asked [place] to please bring for her -Whole  body hypersensitivity: gabapentin 100mg  TID ordered -Pain in left heel: XR obtained and negative, cam boot ordered to help offload heel with therapy, discussed that this has helped -R shoulder pain: XR neg for acute etiology on 04/06/23, voltaren gel ordered--encourage ROM as tolerated  4. History of anxiety: discussed with her mom that she feels this was a big risk factor for her accident, propanolol d/ced due to hypotension. Continue magnesium supplement. Asked SW to place her on neuropsych list, Buspar ordered prn, discussed placing outpatient referral for behavioral health f/u  5. Neuropsych/cognition: This patient is not capable of making decisions on her own behalf  - 10/12: Still not capable of medical decision making; BUT oriented and communicating, following directions - MUCH improved - PRN melatonin 3mg    for insomnia  6.  Skin: -Buttock/sacrum--foam dressing, pressure relief, nutrition -stage 3 left heel, continue foam dressing/ pressure relief  7. Fluids/Electrolytes/Nutrition: Routine in and outs with follow-up chemistries  -Eating  well with cueing   -Dysphagia: continue D3/thins--tolerating this diet -Low protein stores: d/c prosource since patient hates these and is eating 100% of meals -Suboptimal vitamin D level: increase to 5,000 U daily. continue this dose  -History of magnesium deficiency: magnesium 500mg  stopped to support BP  -12/17 good po intake 8.  Cavitary right lower lobe pneumonia likely aspiration pneumonia/MRSA pneumonia.  Resolved    Latest Ref Rng & Units 05/02/2023    5:20 AM 04/18/2023    5:16 AM 04/04/2023    6:09 AM  CBC  WBC 4.0 - 10.5 K/uL 5.9  6.0  5.6   Hemoglobin 12.0 - 15.0 g/dL 91.4  78.2  95.6   Hematocrit 36.0 - 46.0 % 43.0  41.6  42.1   Platelets 150 - 400 K/uL 241  194  224      9.  History of drug abuse.  Positive cocaine on urine drug screen.  Provide counseling  10.  AKI/hypovolemia and ATN.  Resolved     Latest Ref Rng & Units 05/02/2023    5:20 AM 04/18/2023    5:16 AM 04/04/2023    6:09 AM  BMP  Glucose 70 - 99 mg/dL 89  79  91   BUN 6 - 20 mg/dL 14  17  20    Creatinine 0.44 - 1.00 mg/dL 2.13  0.86  5.78   Sodium 135 - 145 mmol/L 138  137  138   Potassium 3.5 - 5.1 mmol/L 4.0  3.8  3.9   Chloride 98 - 111 mmol/L 109  105  105   CO2 22 - 32 mmol/L 23  23  24    Calcium 8.9 - 10.3 mg/dL 8.9  9.0  9.3     11.  Mild transaminitis with rhabdomyolysis.  Both resolved  12.  Hypotension:  D/c amantadine -05/12/23 BPs mostly stable, monitor Vitals:   05/09/23 0315 05/09/23 1521 05/09/23 2014 05/10/23 0507  BP: 105/70 101/63 99/74 (!) 107/54   05/10/23 1335 05/10/23 1954 05/10/23 2110 05/11/23 0330  BP: (!) 86/57 (!) 87/80 99/62 97/70    05/11/23 0540 05/11/23 1336 05/11/23 2137 05/12/23 0553  BP: (!) 148/61 103/70 94/63 92/66     13. HTN:  resolved   14. Impaired initiation: resolved, d/c amantadine  15. Hyperglycemia: CBGs mildly elevated, d/ced checks. Prosource d/ced as per patient's request  16. Spasticity:  Continue wrist, elbow brace, and PRAFOs. Ordered a night splint for LLE  D/c baclofen and tizanidine- much improved  17.  Dyskinetic movements in head and neck - appears  to be myoclonic jerks related to hypoxic brain injury - as discussed with pt mom, no specific meds for this on antispasticity meds , consioder benzo although with pt's SUD would avoid this class - EEG reviewed and was negative for seizure.  -  resolved   18. Cervical extensor weakness: decrease tizanidine to 2mg  HS, decrease baclofen to 5mg  QHS, improved, d/c'd  19. Bowel and bladder incontinence: continue bowel and bladder program. D/c miralax, decrease tizanidine to 2mg  HS, d/c'd  20. Fatigue: resolved, discussed with team may be secondary to anti-spasticity medications, B12 reviewed and is 335, in suboptimal range, will give 1,000U B12 injection 8/7, will order monthly as needed, metanx started, increase to BID, continue, d/c tizanidine, discussed with patient, d/c baclofen   21. MRSA nares positive: negative on repeat, precautions d/ced  22. Tachycardic: resolved, d/c propanolol. Continue tizanidine 2mg  HS. Resolved, amantadine increased back to 100mg  BID, continue this dose, resolved  23. S/p fall: CT head reviewed and is stable  24. Bilateral foot drop: bilateral AFOs ordered, discussed that these can help her to walk better  25. Vaginal itching: completed fluconazole. Having some discharge now. See below  26. Dysuria ->100k E Coli UTI: 11/28 sensitive to amoxil, completed 1 week course -05/12/23 having dysuria and urinary hesitancy, recheck U/A today, asked nursing to please help her get changed when wet.   27. Poor sitting balance: continue OT/PT, improving  28. Headaches: topamax 47m daily prn ordered, continue  29. Drowsiness  with tramadol: d/ced  30. IUD in place? Pt states she has IUD x61yrs  and believes it's supposed to be removed soon(chart review- Dr. Seymour Bars placed Mirena IUD on 07/01/19, good for 81yrs); needs outpatient follow-up with OB/GYN  31. Constipation: d/c baclofen, last BM 12/18, milk of magnesia ordered 12/20, LBM 12/27, continue magnesium gluconate  32. History of drug addiction: tramadol d/ced as per mom's preference  33. Depression: discussed scheduling for outpatient psychology/psychiatry follow-up, discussed neuropsych f/u while in hospital  34. Sinusitis: claritin ordered prn, continue       LOS: 175 days A FACE TO FACE EVALUATION WAS PERFORMED  [address] 05/12/2023, 11:24 AM

## 2023-05-12 NOTE — Progress Notes (Signed)
Occupational Therapy Session Note  Patient Details  Name: Nicole [name] MRN: [id number] Date of Birth: 1976-02-23  Today's Date: 05/12/2023 OT Individual Time: 7829-5621 OT Individual Time Calculation (min): 60 min    Short Term Goals: Week 23 Week 4:  OT Short Term Goal 1 (Week 4): Patient will motor plan to transition from supine to sitting from flat surface with min assist OT Short Term Goal 1 - Progress (Week 4): Other (comment) OT Short Term Goal 2 (Week 4): Patient will OT Short Term Goal 2 - Progress (Week 4): Other (comment) OT Short Term Goal 3 (Week 4): Patient will don left sock with min assist OT Short Term Goal 3 - Progress (Week 4): Other (comment) OT Short Term Goal 4 (Week 4): Patient will trial wearing underwear during weekday x 2 OT Short Term Goal 4 - Progress (Week 4): Other (comment) OT Short Term Goal 5 (Week 4): Patient will grab hair to place in ponytail with BUE OT Short Term Goal 5 - Progress (Week 4): Other (comment)  Skilled Therapeutic Interventions/Progress Updates:    Patient received seated at edge of bed - direct hand off from NT.  Patient with SCD's on legs while seated.  Patient eager for a shower and walked to shower with contact guard initially.  Patient indicated need to void - and aware that staff hope to get a UA due to symptoms of UTI.  Unable to void.  Removed clothing while seated with cueing for technique.  Patient showing more signs of apraxia the more mobile and aware she becomes.  Patient sat at sink to complete grooming and dressing.  Adjusted her environment to have needed hygiene items in reach to increase her independence - she uses environmental cues to cue her for next steps.  Able to don both socks today without help!  Left up in wheelchair at the sink without safety belt as mom arrived. [name] instructed to let staff know when she was leaving.    Therapy Documentation Precautions:  Precautions Precautions: Fall Precaution  Comments: decreased stand balance- reliant on UE, Pain in LLE limits mobility at times Required Braces or Orthoses: Other Brace Other Brace: B adjustable night splints; B elbow "cosey" splints; L palm protector; R mitt Restrictions Weight Bearing Restrictions Per Provider Order: No   Pain:  Reports 7/10 back pain       Therapy/Group: Individual Therapy  [name] 05/12/2023, 12:33 PM

## 2023-05-12 NOTE — Progress Notes (Signed)
Physical Therapy Session Note  Patient Details  Name: Nicole [name] MRN: [id number] Date of Birth: 29-Sep-1975  Today's Date: 05/12/2023 PT Individual Time: 0800-0840 PT Individual Time Calculation (min): 40 min   Short Term Goals: See flowsheet for weekly goals  Skilled Therapeutic Interventions/Progress Updates:    Chart reviewed and pt agreeable to therapy. Pt received seated in bed with 7/10 c/o pain in back and B LE. Session focused on endurance and amb to promote safe home access and back stretches to reduce pain symptoms. Pt initiated session with transfer to EOB using S and modA to done shoes. Pt then amb 12ft to therapy gym using close S + RW. Pt noted to require VC for safe sitting technique. Pt then completed 8 mins on NuStep at level 1 2/2 pain. Pt stated pain not improving with increased activity and B shoulders beginning to ache. NuStep discontinued and pt amb to mat with close S + RW. Pt then participated in blocked practice of amb with RW around objects in hall and gym with S + RW and minA VC for safe navigation. In room, pt educated on and demonstrated L SKC and piriformis stretch in bed, with pt reporting some relief of pain symptoms, but not abolished.  At end of session, pt was left seated in bed with alarm engaged, nurse call bell and all needs in reach.     Therapy Documentation Precautions:  Precautions Precautions: Fall Precaution Comments: decreased stand balance- reliant on UE, Pain in LLE limits mobility at times Required Braces or Orthoses: Other Brace Other Brace: B adjustable night splints; B elbow "cosey" splints; L palm protector; R mitt Restrictions Weight Bearing Restrictions Per Provider Order: No     Therapy/Group: Individual Therapy  Dionne Milo 05/12/2023, 12:05 PM

## 2023-05-13 MED ORDER — SULFAMETHOXAZOLE-TRIMETHOPRIM 800-160 MG PO TABS
1.0000 | ORAL_TABLET | Freq: Two times a day (BID) | ORAL | Status: AC
  Administered 2023-05-13 – 2023-05-15 (×6): 1 via ORAL
  Filled 2023-05-13 (×6): qty 1

## 2023-05-13 NOTE — Progress Notes (Signed)
PROGRESS NOTE   Subjective/Complaints:  Pt doing ok today, didn't sleep great last night because she stayed up watching 21 jump Javontay Vandam. Pain doing better today.  LBM yesterday.  Urinating ok, but still states she's having dysuria, discussed U/A results. Otherwise denies any other complaints or concerns today.   ROS: Patient denies fever, CP, SOB, abd pain, nausea, vomiting, diarrhea. +anxiety and depression- improved, improved initiation    Objective:   No results found.  No results for input(s): "WBC", "HGB", "HCT", "PLT" in the last 72 hours.        No results for input(s): "NA", "K", "CL", "CO2", "GLUCOSE", "BUN", "CREATININE", "CALCIUM" in the last 72 hours.         Intake/Output Summary (Last 24 hours) at 05/13/2023 1139 Last data filed at 05/12/2023 2300 Gross per 24 hour  Intake 477 ml  Output 0 ml  Net 477 ml           Physical Exam: Vital Signs Blood pressure 108/67, pulse 72, temperature 98.2 F (36.8 C), resp. rate 16, height 5\' 1"  (1.549 m), weight 114 kg, SpO2 98%.   General: No acute distress, sitting up in w/c HEENT: NCAT, EOMI, oral membranes moist Cards: RRR, no m/r/g Chest: normal effort, CTAB Abdomen: Soft, ND, +BS throughout, very minimal suprapubic TTP unchanged 12/29 Skin: dry, intact Extremities: no edema Psych: pleasant and appropriate  PRIOR EXAMS: Skin: + Scaling dried skin on bilateral heels left buttock unstageable - not examined + Left heel stage III DTI has resolved Neuro: memory seeming to improve lately Neurologic: AAOx2, fair insight and awareness..  + Hypophonation, speech intelligability much improved. Fair insight. MAS 1 right elbow,MAS  tr to 1 in RIght finger and wrist flexors  MAS 1 in extensors of LE's MAS 1 L elbow , MAS 1+ in L wrist and finger flexors. Hypersensitivity to left hand improved, Left sided strength improved, decreased range of motion  and pain at end range of motion in left shoulder  Musculoskeletal: limited bilateral shoulder and elbow ROM due to increased tone ,right shoulder pain with PROM--stable 12/26     Assessment/Plan: 1. Functional deficits which require 3+ hours per day of interdisciplinary therapy in a comprehensive inpatient rehab setting. Physiatrist is providing close team supervision and 24 hour management of active medical problems listed below. Physiatrist and rehab team continue to assess barriers to discharge/monitor patient progress toward functional and medical goals  Care Tool:  Bathing    Body parts bathed by patient: Face, Abdomen, Chest, Left upper leg, Right upper leg, Left arm, Front perineal area, Right arm, Right lower leg, Left lower leg, Buttocks   Body parts bathed by helper: Buttocks Body parts n/a: Right arm, Left arm, Front perineal area, Buttocks, Right upper leg, Left upper leg, Right lower leg, Left lower leg   Bathing assist Assist Level: Contact Guard/Touching assist     Upper Body Dressing/Undressing Upper body dressing   What is the patient wearing?: Pull over shirt    Upper body assist Assist Level: Set up assist    Lower Body Dressing/Undressing Lower body dressing      What is the patient wearing?: Incontinence brief, Pants  Lower body assist Assist for lower body dressing: Minimal Assistance - Patient > 75%     Toileting Toileting Toileting Activity did not occur Press photographer and hygiene only): N/A (no void or bm)  Toileting assist Assist for toileting: Minimal Assistance - Patient > 75%     Transfers Chair/bed transfer  Transfers assist  Chair/bed transfer activity did not occur: Safety/medical concerns  Chair/bed transfer assist level: Supervision/Verbal cueing Chair/bed transfer assistive device: Geologist, engineering   Ambulation assist   Ambulation activity did not occur: Safety/medical concerns  Assist level:  Contact Guard/Touching assist Assistive device: Walker-rolling Max distance: 150   Walk 10 feet activity   Assist  Walk 10 feet activity did not occur: Safety/medical concerns  Assist level: Contact Guard/Touching assist Assistive device: Walker-rolling   Walk 50 feet activity   Assist Walk 50 feet with 2 turns activity did not occur: Safety/medical concerns  Assist level: Contact Guard/Touching assist Assistive device: Walker-rolling    Walk 150 feet activity   Assist Walk 150 feet activity did not occur: Safety/medical concerns  Assist level: Contact Guard/Touching assist Assistive device: Walker-rolling    Walk 10 feet on uneven surface  activity   Assist Walk 10 feet on uneven surfaces activity did not occur: Safety/medical concerns   Assist level: Minimal Assistance - Patient > 75% Assistive device: Walker-rolling   Wheelchair     Assist Is the patient using a wheelchair?: No Type of Wheelchair: Manual    Wheelchair assist level: Moderate Assistance - Patient 50 - 74% Max wheelchair distance: 51ft    Wheelchair 50 feet with 2 turns activity    Assist    Wheelchair 50 feet with 2 turns activity did not occur: Safety/medical concerns   Assist Level: Supervision/Verbal cueing   Wheelchair 150 feet activity     Assist  Wheelchair 150 feet activity did not occur: Safety/medical concerns   Assist Level: Moderate Assistance - Patient 50 - 74%   Blood pressure 108/67, pulse 72, temperature 98.2 F (36.8 C), resp. rate 16, height 5\' 1"  (1.549 m), weight 114 kg, SpO2 98%.  Medical Problem List and Plan: 1. 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 Awaiting approval of disability. Placement pending. Hoping that hospital will provide LOG for placement. Code status  DNR Continue CIR therapies including PT, OT, and SLP   Team conference 12/4  Have discussed prognosis with mom, will discuss with patient  [name] pass ordered  2.  Impaired mobility: d/c lovenox since ambulating >150 feet, SCDs ordered  3. Knee pain: Provided list of foods for pain. Tylenol as needed, continue voltaren gel- changed to prn. XR reviewed and is stable, discussed with therapy -Right wrist swelling: XR ordered and shows no acute fracture, shows 2.79mm ulnar variance, brace removed to given patient a break from it. Voltaren gel ordered prn for discomfort, continue, CT ordered and show no acute osseus injury, continue ace wrap   -Foot pain: tramadol ordered prn, improved today, continue prn  -Headaches:continue tylenol and tramadol prn -Low back pain: continue kpad, asked [place] to please bring for her -Whole body hypersensitivity: gabapentin 100mg  TID ordered -Pain in left heel: XR obtained  and negative, cam boot ordered to help offload heel with therapy, discussed that this has helped -R shoulder pain: XR neg for acute etiology on 04/06/23, voltaren gel ordered--encourage ROM as tolerated  4. History of anxiety: discussed with her mom that she feels this was a big risk factor for her accident, propanolol d/ced due to hypotension. Continue magnesium supplement. Asked SW to place her on neuropsych list, Buspar ordered prn, discussed placing outpatient referral for behavioral health f/u  5. Neuropsych/cognition: This patient is not capable of making decisions on her own behalf  - 10/12: Still not capable of medical decision making; BUT oriented and communicating, following directions - MUCH improved - PRN melatonin 3mg    for insomnia  6. Skin: -Buttock/sacrum--foam dressing, pressure relief, nutrition -stage 3  left heel, continue foam dressing/ pressure relief  7. Fluids/Electrolytes/Nutrition: Routine in and outs with follow-up chemistries  -Eating  well with cueing   -Dysphagia: continue D3/thins--tolerating this diet -Low protein stores: d/c prosource since patient hates these and is eating 100% of meals -Suboptimal vitamin D level: increase to 5,000 U daily. continue this dose  -History of magnesium deficiency: magnesium 500mg  stopped to support BP  -12/17 good po intake 8.  Cavitary right lower lobe pneumonia likely aspiration pneumonia/MRSA pneumonia.  Resolved    Latest Ref Rng & Units 05/02/2023    5:20 AM 04/18/2023    5:16 AM 04/04/2023    6:09 AM  CBC  WBC 4.0 - 10.5 K/uL 5.9  6.0  5.6   Hemoglobin 12.0 - 15.0 g/dL 81.1  91.4  78.2   Hematocrit 36.0 - 46.0 % 43.0  41.6  42.1   Platelets 150 - 400 K/uL 241  194  224      9.  History of drug abuse.  Positive cocaine on urine drug screen.  Provide counseling  10.  AKI/hypovolemia and ATN.  Resolved     Latest Ref Rng & Units 05/02/2023    5:20 AM 04/18/2023    5:16 AM 04/04/2023    6:09 AM  BMP  Glucose 70 - 99 mg/dL 89  79  91   BUN 6 - 20 mg/dL 14  17  20    Creatinine 0.44 - 1.00 mg/dL 9.56  2.13  0.86   Sodium 135 - 145 mmol/L 138  137  138   Potassium 3.5 - 5.1 mmol/L 4.0  3.8  3.9   Chloride 98 - 111 mmol/L 109  105  105   CO2 22 - 32 mmol/L 23  23  24    Calcium 8.9 - 10.3 mg/dL 8.9  9.0  9.3     11.  Mild transaminitis with rhabdomyolysis.  Both resolved  12.  Hypotension:  D/c amantadine -12/28-29/24 BPs mostly stable, monitor Vitals:   05/10/23 0507 05/10/23 1335 05/10/23 1954 05/10/23 2110  BP: (!) 107/54 (!) 86/57 (!) 87/80 99/62   05/11/23 0330 05/11/23 0540 05/11/23 1336 05/11/23 2137  BP: 97/70 (!) 148/61 103/70 94/63   05/12/23 0553 05/12/23 1402 05/12/23 1919 05/13/23 0355  BP: 92/66 102/66 90/61 108/67    13. HTN: resolved   14. Impaired initiation: resolved, d/c amantadine  15.  Hyperglycemia: CBGs mildly elevated, d/ced checks. Prosource d/ced as per patient's request  16. Spasticity:  Continue wrist, elbow brace, and PRAFOs. Ordered a night splint for LLE  D/c baclofen and tizanidine- much improved  17.  Dyskinetic movements in head and neck - appears to be myoclonic jerks related to hypoxic brain injury - as discussed  with pt mom, no specific meds for this on antispasticity meds , consioder benzo although with pt's SUD would avoid this class - EEG reviewed and was negative for seizure.  -  resolved   18. Cervical extensor weakness: decrease tizanidine to 2mg  HS, decrease baclofen to 5mg  QHS, improved, d/c'd  19. Bowel and bladder incontinence: continue bowel and bladder program. D/c miralax, decrease tizanidine to 2mg  HS, d/c'd  20. Fatigue: resolved, discussed with team may be secondary to anti-spasticity medications, B12 reviewed and is 335, in suboptimal range, will give 1,000U B12 injection 8/7, will order monthly as needed, metanx started, increase to BID, continue, d/c tizanidine, discussed with patient, d/c baclofen   21. MRSA nares positive: negative on repeat, precautions d/ced  22. Tachycardic: resolved, d/c propanolol. Continue tizanidine 2mg  HS. Resolved, amantadine increased back to 100mg  BID, continue this dose, resolved  23. S/p fall: CT head reviewed and is stable  24. Bilateral foot drop: bilateral AFOs ordered, discussed that these can help her to walk better  25. Vaginal itching: completed fluconazole. Having some discharge now. See below  26. Dysuria ->100k E Coli UTI: 11/28 sensitive to amoxil, completed 1 week course -05/12/23 having dysuria and urinary hesitancy, recheck U/A today, asked nursing to please help her get changed when wet.  -05/13/23 U/A fairly unremarkable, but pt with persisting UTI type symptoms; will proceed with presumptive tx with Bactrim DS BID x3 days, if still unresolved then may need to further evaluate this  issue; timed toileting apparently not helping much with incontinence, per nursing pt stayed on toilet for without urination, and then went on to wet brief immediately after. Hopefully tx for UTI will help.   27. Poor sitting balance: continue OT/PT, improving  28. Headaches: topamax 31m daily prn ordered, continue  29. Drowsiness with tramadol: d/ced  30. IUD in place? Pt states she has IUD x58yrs  and believes it's supposed to be removed soon(chart review- Dr. Seymour Bars placed Mirena IUD on 07/01/19, good for 16yrs); needs outpatient follow-up with OB/GYN  31. Constipation: d/c baclofen, last BM 12/18, milk of magnesia ordered 12/20, LBM 12/28, continue magnesium gluconate  32. History of drug addiction: tramadol d/ced as per mom's preference  33. Depression: discussed scheduling for outpatient psychology/psychiatry follow-up, discussed neuropsych f/u while in hospital  34. Sinusitis: claritin ordered prn, continue       LOS: 176 days A FACE TO FACE EVALUATION WAS PERFORMED  [address] 05/13/2023, 11:39 AM

## 2023-05-13 NOTE — Progress Notes (Signed)
Restless night without specific complaint of. Incontinent with timed toileting. UA sent at shift change. PRN Buspar given at 2004. PRN tylenol given at 2114, for right knee pain. LBM 12/28. Alfredo Martinez A

## 2023-05-14 ENCOUNTER — Inpatient Hospital Stay (HOSPITAL_COMMUNITY): Payer: MEDICAID

## 2023-05-14 DIAGNOSIS — G931 Anoxic brain damage, not elsewhere classified: Secondary | ICD-10-CM | POA: Diagnosis not present

## 2023-05-14 MED ORDER — ENSURE ENLIVE PO LIQD
237.0000 mL | Freq: Two times a day (BID) | ORAL | Status: DC
  Administered 2023-05-15 – 2023-05-21 (×12): 237 mL via ORAL

## 2023-05-14 MED ORDER — PHENAZOPYRIDINE HCL 100 MG PO TABS
100.0000 mg | ORAL_TABLET | Freq: Three times a day (TID) | ORAL | Status: AC
  Administered 2023-05-14 – 2023-05-16 (×6): 100 mg via ORAL
  Filled 2023-05-14 (×6): qty 1

## 2023-05-14 NOTE — Progress Notes (Signed)
Physical Therapy Session Note  Patient Details  Name: Nicole [name] MRN: [id number] Date of Birth: 04-13-1976  Today's Date: 05/14/2023 PT Individual Time: 1000-1045 + 1400-1443 PT Individual Time Calculation (min): 45 min  + 43 min  Short Term Goals: Week 1:  PT Short Term Goal 1 (Week 1): Pt will perform supine<>sit with supervision PT Short Term Goal 1 - Progress (Week 1): Other (comment) (new goal) PT Short Term Goal 2 (Week 1): Pt will ambulate at least 12ft with no more than min A, not using an AD. PT Short Term Goal 2 - Progress (Week 1): Other (comment) (new goal) PT Short Term Goal 3 (Week 1): Pt will demonstrate decreased fall risk as noted by an improvement of at least 7 points on the [employer] Test PT Short Term Goal 3 - Progress (Week 1): Other (comment) (new goal) PT Short Term Goal 4 (Week 1): Pt will navigate 4 steps using HRs with no more than CGA  Skilled Therapeutic Interventions/Progress Updates:      1st session: Pt lying in bed to start. Reports some lower back pain, unrated. Provided mobility and rest break for management. Donned pants with minA at bed level. Able to bridge and roll in bed as needed to pull pants over hips.   Supine<>sitting EOB with supervision assist using hospital bed features, safety cues needed to prevent from rolling off EOB.   Sit<>stand to RW with CGA and completed gait training to main rehab gym ~189ft with CGA and RW.   NMR for dynamic standing balance - standing on large wooden rocker board in // bars with BUE support. Completed in split stance and shoulder stance with CGA/minA. Patient with persistent forward LOB and used mirror for visual aid to correct. Pt with insufficent and delayed stepping strategies for balance recovery.   Stair training using 6" completed using 2 HR - reciprocal stepping for ascent and step-to pattern for descent, CGA/minA needed for navigating x12 steps.   Worked on core strengthening at mat table to help  with lower back pain. Completed 5x10 second planks on forearms and knees. Also completed 2x5 sit ups with PT stabilizing BLE.   Pt returned upright to sitting without assist and completed gait training back to her room with similar assist. Setup K-pad for lower back to help with pain management. Applied B SCD's per her request. All needs met at end.     2nd session: Pt sitting in wheelchair to start. Reports continued lower back pain. Provided rest breaks and mobility for pain management. Pt mildly impuslive - trying to stand from unlocked wheelchair without assist or RW. Safety cues and fall prevention education provided.   Sit<>stand to RW with CGA. Ambulated with CGA and RW to main rehab gym, ~123ft. Worked on dynamic sitting balance while sitting on physioball.  Matt table NMR -forearm and knees planks 2x20 seconds -attempted side planks on elbow/knees, but unable to lift hips off table in this position. So worked on lateral propping on elbow in sidelying position instead. -tall kneeling wall walks with BUE -1/2 kneeling to tall kneeling 1x10 without UE support -tall kneeling wall claps 2x10  Pt ambulated back to her room with CGA and RW. Pt assisted to bed as transport present for XR. Bed mobility completed with minA for repositioning to [facility]. All needs met with direct handoff of care.    Therapy Documentation Precautions:  Precautions Precautions: Fall Precaution Comments: decreased stand balance- reliant on UE, Pain in LLE limits mobility  at times Required Braces or Orthoses: Other Brace Other Brace: B adjustable night splints; B elbow "cosey" splints; L palm protector; R mitt Restrictions Weight Bearing Restrictions Per Provider Order: No General:    Therapy/Group: Individual Therapy  Orrin Brigham 05/14/2023, 7:42 AM

## 2023-05-14 NOTE — Progress Notes (Signed)
Occupational Therapy Session Note  Patient Details  Name: Nicole [name] MRN: [id number] Date of Birth: [birth year]  Today's Date: 05/14/2023 OT Individual Time: 1100-1205 OT Individual Time Calculation (min): 65 min    Short Term Goals: Week 4:  OT Short Term Goal 1 (Week 4): Patient will motor plan to transition from supine to sitting from flat surface with min assist OT Short Term Goal 1 - Progress (Week 4): Other (comment) OT Short Term Goal 2 (Week 4): Patient will OT Short Term Goal 2 - Progress (Week 4): Other (comment) OT Short Term Goal 3 (Week 4): Patient will don left sock with min assist OT Short Term Goal 3 - Progress (Week 4): Other (comment) OT Short Term Goal 4 (Week 4): Patient will trial wearing underwear during weekday x 2 OT Short Term Goal 4 - Progress (Week 4): Other (comment) OT Short Term Goal 5 (Week 4): Patient will grab hair to place in ponytail with BUE OT Short Term Goal 5 - Progress (Week 4): Other (comment)  Skilled Therapeutic Interventions/Progress Updates:    Patient received supine in bed, requesting to shower.  Patient walked to shower, attempted toileting first, then removed clothing and walked to shower.  Patient able to follow routine to shower with only intermittent min assist.  Patient needs cueing to continue at times due to apraxia, and diminished attention skills.  Dressed self with min assist for shoes.  Patient again able to don socks on both feet.  Patient able to reach items for grooming without assistance.  Left patient up in wheelchair with safety bet engaged.     Therapy Documentation Precautions:  Precautions Precautions: Fall Precaution Comments: decreased stand balance- reliant on UE, Pain in LLE limits mobility at times Required Braces or Orthoses: Other Brace Other Brace: B adjustable night splints; B elbow "cosey" splints; L palm protector; R mitt Restrictions Weight Bearing Restrictions Per Provider Order: No   Pain: No pain  reported     Therapy/Group: Individual Therapy  [name] 05/14/2023, 12:17 PM

## 2023-05-14 NOTE — Progress Notes (Signed)
   05/14/23 1650  What Happened  Was fall witnessed? No  Was patient injured? No  Patient found on floor  Found by Staff-comment Joneen Roach, RN)  Stated prior activity other (comment) (Reaching for papertowel that was on the floor)  Provider Notification  Provider Name/Title Deatra Ina, PA  Date Provider Notified 05/14/23  Time Provider Notified 1650  Method of Notification Call  Notification Reason Fall  Test performed and critical result CT Head ordered. result pending  Provider response See new orders  Date of Provider Response 05/14/23  Time of Provider Response 1650  Follow Up  Family notified Yes - comment (Mother [name])  Time family notified 1710  Additional tests No  Simple treatment Other (comment) (none)  Progress note created (see row info) Yes  Adult Fall Risk Assessment  Risk Factor Category (scoring not indicated) Fall has occurred during this admission (document High fall risk);High fall risk per protocol (document High fall risk)  Age 47  Fall History: Fall within 6 months prior to admission 5  Elimination; Bowel and/or Urine Incontinence 2  Elimination; Bowel and/or Urine Urgency/Frequency 2  Medications: includes PCA/Opiates, Anti-convulsants, Anti-hypertensives, Diuretics, Hypnotics, Laxatives, Sedatives, and Psychotropics 3  Patient Care Equipment 0  Mobility-Assistance 2  Mobility-Gait 2  Mobility-Sensory Deficit 0  Altered awareness of immediate physical environment 1  Impulsiveness 2  Lack of understanding of one's physical/cognitive limitations 4  Total Score 23  Patient Fall Risk Level High fall risk  Adult Fall Risk Interventions  Required Bundle Interventions *See Row Information* High fall risk - low, moderate, and high requirements implemented  Additional Interventions Use of appropriate toileting equipment (bedpan, BSC, etc.)  Fall intervention(s) refused/Patient educated regarding refusal Bed alarm;Nonskid socks;Yellow bracelet;Open door  if unsupervised;Supervision while toileting/edge of bed sitting  Screening for Fall Injury Risk (To be completed on HIGH fall risk patients) - Assessing Need for Floor Mats  Risk For Fall Injury- Criteria for Floor Mats Previous fall this admission  Will Implement Floor Mats Yes  Vitals  Temp 98.2 F (36.8 C)  Temp Source Oral  BP 113/75  MAP (mmHg) 87  BP Location Left Leg  BP Method Automatic  Patient Position (if appropriate) Lying  Pulse Rate 79  Pulse Rate Source Monitor  Resp 18  Oxygen Therapy  SpO2 100 %  O2 Device Room Air  Pain Assessment  Pain Scale Faces  Faces Pain Scale 2  Pain Location Knee  Pain Orientation Right;Left  Neurological  Neuro (WDL) X  Level of Consciousness Alert  Orientation Level Oriented to person;Oriented to place;Oriented to time  Cognition Follows commands  Speech Clear  R Hand Grip Moderate  L Hand Grip Weak   RUE Motor Response Purposeful movement  RUE Sensation Full sensation  RUE Motor Strength 4  LUE Motor Response Purposeful movement  LUE Sensation Full sensation  LUE Motor Strength 4  RLE Motor Response Purposeful movement  RLE Sensation Full sensation  RLE Motor Strength 4  LLE Motor Response Purposeful movement  LLE Sensation Full sensation  LLE Motor Strength 4  Neuro Symptoms None  Neuro symptoms relieved by Rest  Musculoskeletal  Musculoskeletal (WDL) X  Assistive Device Front wheel walker  Generalized Weakness Yes  Weight Bearing Restrictions Per Provider Order No  Integumentary  Integumentary (WDL) X  Skin Color Appropriate for ethnicity  Skin Condition Dry  Skin Integrity Erythema/redness  Erythema/Redness Location Sacrum;Heel

## 2023-05-14 NOTE — Progress Notes (Signed)
PROGRESS NOTE   Subjective/Complaints:  Pt reports nothing major, except still having dysuria.  Denies constipation, but having abd pain.   LBM 12/28- large, but having abd pain.    ROS:   Pt denies SOB,  (+)abd pain, CP, N/V/C/D, and vision changes    +anxiety and depression- improved, improved initiation    Objective:   No results found.  No results for input(s): "WBC", "HGB", "HCT", "PLT" in the last 72 hours.        No results for input(s): "NA", "K", "CL", "CO2", "GLUCOSE", "BUN", "CREATININE", "CALCIUM" in the last 72 hours.         Intake/Output Summary (Last 24 hours) at 05/14/2023 1003 Last data filed at 05/14/2023 0746 Gross per 24 hour  Intake 1817 ml  Output --  Net 1817 ml           Physical Exam: Vital Signs Blood pressure 98/66, pulse 76, temperature 98.1 F (36.7 C), temperature source Oral, resp. rate 18, height 5\' 1"  (1.549 m), weight 114 kg, SpO2 97%.     General: awake, alert, appropriate,  just woke up; NAD HENT: conjugate gaze; oropharynx moist CV: regular rate and rhythm; no JVD Pulmonary: CTA B/L; no W/R/R- good air movement GI: soft, slightly TTP overall; ND; hypoactive BS Psychiatric: appropriate- a little vague Neurological: alert- poor memory PRIOR EXAMS: Skin: + Scaling dried skin on bilateral heels left buttock unstageable - not examined + Left heel stage III DTI has resolved Neuro: memory seeming to improve lately Neurologic: AAOx2, fair insight and awareness..  + Hypophonation, speech intelligability much improved. Fair insight. MAS 1 right elbow,MAS  tr to 1 in RIght finger and wrist flexors  MAS 1 in extensors of LE's MAS 1 L elbow , MAS 1+ in L wrist and finger flexors. Hypersensitivity to left hand improved, Left sided strength improved, decreased range of motion and pain at end range of motion in left shoulder  Musculoskeletal: limited bilateral  shoulder and elbow ROM due to increased tone ,right shoulder pain with PROM--stable 12/26     Assessment/Plan: 1. Functional deficits which require 3+ hours per day of interdisciplinary therapy in a comprehensive inpatient rehab setting. Physiatrist is providing close team supervision and 24 hour management of active medical problems listed below. Physiatrist and rehab team continue to assess barriers to discharge/monitor patient progress toward functional and medical goals  Care Tool:  Bathing    Body parts bathed by patient: Face, Abdomen, Chest, Left upper leg, Right upper leg, Left arm, Front perineal area, Right arm, Right lower leg, Left lower leg, Buttocks   Body parts bathed by helper: Buttocks Body parts n/a: Right arm, Left arm, Front perineal area, Buttocks, Right upper leg, Left upper leg, Right lower leg, Left lower leg   Bathing assist Assist Level: Contact Guard/Touching assist     Upper Body Dressing/Undressing Upper body dressing   What is the patient wearing?: Pull over shirt    Upper body assist Assist Level: Set up assist    Lower Body Dressing/Undressing Lower body dressing      What is the patient wearing?: Incontinence brief, Pants     Lower body assist Assist for  lower body dressing: Minimal Assistance - Patient > 75%     Toileting Toileting Toileting Activity did not occur Press photographer and hygiene only): N/A (no void or bm)  Toileting assist Assist for toileting: Minimal Assistance - Patient > 75%     Transfers Chair/bed transfer  Transfers assist  Chair/bed transfer activity did not occur: Safety/medical concerns  Chair/bed transfer assist level: Supervision/Verbal cueing Chair/bed transfer assistive device: Geologist, engineering   Ambulation assist   Ambulation activity did not occur: Safety/medical concerns  Assist level: Contact Guard/Touching assist Assistive device: Walker-rolling Max distance: 150   Walk  10 feet activity   Assist  Walk 10 feet activity did not occur: Safety/medical concerns  Assist level: Contact Guard/Touching assist Assistive device: Walker-rolling   Walk 50 feet activity   Assist Walk 50 feet with 2 turns activity did not occur: Safety/medical concerns  Assist level: Contact Guard/Touching assist Assistive device: Walker-rolling    Walk 150 feet activity   Assist Walk 150 feet activity did not occur: Safety/medical concerns  Assist level: Contact Guard/Touching assist Assistive device: Walker-rolling    Walk 10 feet on uneven surface  activity   Assist Walk 10 feet on uneven surfaces activity did not occur: Safety/medical concerns   Assist level: Minimal Assistance - Patient > 75% Assistive device: Walker-rolling   Wheelchair     Assist Is the patient using a wheelchair?: No Type of Wheelchair: Manual    Wheelchair assist level: Moderate Assistance - Patient 50 - 74% Max wheelchair distance: 83ft    Wheelchair 50 feet with 2 turns activity    Assist    Wheelchair 50 feet with 2 turns activity did not occur: Safety/medical concerns   Assist Level: Supervision/Verbal cueing   Wheelchair 150 feet activity     Assist  Wheelchair 150 feet activity did not occur: Safety/medical concerns   Assist Level: Moderate Assistance - Patient 50 - 74%   Blood pressure 98/66, pulse 76, temperature 98.1 F (36.7 C), temperature source Oral, resp. rate 18, height 5\' 1"  (1.549 m), weight 114 kg, SpO2 97%.  Medical Problem List and Plan: 1. 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 Awaiting approval of  disability. Placement pending. Hoping that hospital will provide LOG for placement. Code status  DNR Con't CIR PT, OT and SLP   Team conference 12/4  Have discussed prognosis with mom, will discuss with patient  [name] pass ordered  2.  Impaired mobility: d/c lovenox since ambulating >150 feet, SCDs ordered  3. Knee pain: Provided list of foods for pain. Tylenol as needed, continue voltaren gel- changed to prn. XR reviewed and is stable, discussed with therapy -Right wrist swelling: XR ordered and shows no acute fracture, shows 2.55mm ulnar variance, brace removed to given patient a break from it. Voltaren gel ordered prn for discomfort, continue, CT ordered and show no acute osseus injury, continue ace wrap   -Foot pain: tramadol ordered prn, improved today, continue prn  -Headaches:continue tylenol and tramadol prn -Low back pain: continue kpad, asked [place] to please bring for her -Whole body hypersensitivity: gabapentin 100mg  TID ordered -Pain in left heel: XR obtained and negative, cam boot  ordered to help offload heel with therapy, discussed that this has helped -R shoulder pain: XR neg for acute etiology on 04/06/23, voltaren gel ordered--encourage ROM as tolerated  4. History of anxiety: discussed with her mom that she feels this was a big risk factor for her accident, propanolol d/ced due to hypotension. Continue magnesium supplement. Asked SW to place her on neuropsych list, Buspar ordered prn, discussed placing outpatient referral for behavioral health f/u  5. Neuropsych/cognition: This patient is not capable of making decisions on her own behalf  - 10/12: Still not capable of medical decision making; BUT oriented and communicating, following directions - MUCH improved - PRN melatonin 3mg    for insomnia  6. Skin: -Buttock/sacrum--foam dressing, pressure relief, nutrition -stage 3 left heel, continue foam dressing/ pressure relief  7. Fluids/Electrolytes/Nutrition: Routine in  and outs with follow-up chemistries  -Eating  well with cueing   -Dysphagia: continue D3/thins--tolerating this diet -Low protein stores: d/c prosource since patient hates these and is eating 100% of meals -Suboptimal vitamin D level: increase to 5,000 U daily. continue this dose  -History of magnesium deficiency: magnesium 500mg  stopped to support BP  -12/17 good po intake 8.  Cavitary right lower lobe pneumonia likely aspiration pneumonia/MRSA pneumonia.  Resolved    Latest Ref Rng & Units 05/02/2023    5:20 AM 04/18/2023    5:16 AM 04/04/2023    6:09 AM  CBC  WBC 4.0 - 10.5 K/uL 5.9  6.0  5.6   Hemoglobin 12.0 - 15.0 g/dL 16.1  09.6  04.5   Hematocrit 36.0 - 46.0 % 43.0  41.6  42.1   Platelets 150 - 400 K/uL 241  194  224      9.  History of drug abuse.  Positive cocaine on urine drug screen.  Provide counseling  10.  AKI/hypovolemia and ATN.  Resolved     Latest Ref Rng & Units 05/02/2023    5:20 AM 04/18/2023    5:16 AM 04/04/2023    6:09 AM  BMP  Glucose 70 - 99 mg/dL 89  79  91   BUN 6 - 20 mg/dL 14  17  20    Creatinine 0.44 - 1.00 mg/dL 4.09  8.11  9.14   Sodium 135 - 145 mmol/L 138  137  138   Potassium 3.5 - 5.1 mmol/L 4.0  3.8  3.9   Chloride 98 - 111 mmol/L 109  105  105   CO2 22 - 32 mmol/L 23  23  24    Calcium 8.9 - 10.3 mg/dL 8.9  9.0  9.3     11.  Mild transaminitis with rhabdomyolysis.  Both resolved  12.  Hypotension:  D/c amantadine -12/28-29/24 BPs mostly stable, monitor Vitals:   05/10/23 1954 05/10/23 2110 05/11/23 0330 05/11/23 0540  BP: (!) 87/80 99/62 97/70  (!) 148/61   05/11/23 1336 05/11/23 2137 05/12/23 0553 05/12/23 1402  BP: 103/70 94/63 92/66  102/66   05/12/23 1919 05/13/23 0355 05/13/23 2021 05/14/23 0402  BP: 90/61 108/67 107/72 98/66    13. HTN: resolved   14. Impaired initiation: resolved, d/c amantadine  15. Hyperglycemia: CBGs mildly elevated, d/ced checks. Prosource d/ced as per patient's request  16. Spasticity:   Continue wrist, elbow brace, and PRAFOs. Ordered a night splint for LLE  D/c baclofen and tizanidine- much improved  17.  Dyskinetic movements in head and neck - appears to be myoclonic jerks related to hypoxic brain injury - as discussed with pt mom, no specific meds  for this on antispasticity meds , consioder benzo although with pt's SUD would avoid this class - EEG reviewed and was negative for seizure.  -  resolved   18. Cervical extensor weakness: decrease tizanidine to 2mg  HS, decrease baclofen to 5mg  QHS, improved, d/c'd  19. Bowel and bladder incontinence: continue bowel and bladder program. D/c miralax, decrease tizanidine to 2mg  HS, d/c'd  12/30- LBM 2 days ago- was large- co abd pain- will get KUB- if full of stool- will need to be cleaned out.  20. Fatigue: resolved, discussed with team may be secondary to anti-spasticity medications, B12 reviewed and is 335, in suboptimal range, will give 1,000U B12 injection 8/7, will order monthly as needed, metanx started, increase to BID, continue, d/c tizanidine, discussed with patient, d/c baclofen   21. MRSA nares positive: negative on repeat, precautions d/ced  22. Tachycardic: resolved, d/c propanolol. Continue tizanidine 2mg  HS. Resolved, amantadine increased back to 100mg  BID, continue this dose, resolved  23. S/p fall: CT head reviewed and is stable  24. Bilateral foot drop: bilateral AFOs ordered, discussed that these can help her to walk better  25. Vaginal itching: completed fluconazole. Having some discharge now. See below  26. Dysuria ->100k E Coli UTI: 11/28 sensitive to amoxil, completed 1 week course -05/12/23 having dysuria and urinary hesitancy, recheck U/A today, asked nursing to please help her get changed when wet.  -05/13/23 U/A fairly unremarkable, but pt with persisting UTI type symptoms; will proceed with presumptive tx with Bactrim DS BID x3 days, if still unresolved then may need to further evaluate this issue;  timed toileting apparently not helping much with incontinence, per nursing pt stayed on toilet for without urination, and then went on to wet brief immediately after. Hopefully tx for UTI will help.   12/30- added Pyridium 100 mg TID x 2 days- to help dysuria.  27. Poor sitting balance: continue OT/PT, improving  28. Headaches: topamax 79m daily prn ordered, continue  29. Drowsiness with tramadol: d/ced  30. IUD in place? Pt states she has IUD x78yrs  and believes it's supposed to be removed soon(chart review- Dr. Seymour Bars placed Mirena IUD on 07/01/19, good for 13yrs); needs outpatient follow-up with OB/GYN  31. Constipation: d/c baclofen, last BM 12/18, milk of magnesia ordered 12/20, LBM 12/28, continue magnesium gluconate  12/30- LBM 12/28- but c/o abd pain- get KUB to see if needs to give Sorbitol.  32. History of drug addiction: tramadol d/ced as per mom's preference  33. Depression: discussed scheduling for outpatient psychology/psychiatry follow-up, discussed neuropsych f/u while in hospital  34. Sinusitis: claritin ordered prn, continue     I spent a total of 35   minutes on total care today- >50% coordination of care- due to  D/w pt about treatment of dysuria- will get KUB to make sure not backed up- esp since this has been a problem more chronically- also d/w pt about abd pain-    LOS: 177 days A FACE TO FACE EVALUATION WAS PERFORMED  Nicole [name] 05/14/2023, 10:03 AM

## 2023-05-14 NOTE — Progress Notes (Signed)
Physical Therapy Session Note  Patient Details  Name: Nicole [name] MRN: [id number] Date of Birth: Sep 03, 1975  Today's Date: 05/14/2023 PT Individual Time: 2440-1027 PT Individual Time Calculation (min): 38 min   Short Term Goals: Week 1:  PT Short Term Goal 1 (Week 1): Pt will perform supine<>sit with supervision PT Short Term Goal 1 - Progress (Week 1): Other (comment) (new goal) PT Short Term Goal 2 (Week 1): Pt will ambulate at least 114ft with no more than min A, not using an AD. PT Short Term Goal 2 - Progress (Week 1): Other (comment) (new goal) PT Short Term Goal 3 (Week 1): Pt will demonstrate decreased fall risk as noted by an improvement of at least 7 points on the [employer] Test PT Short Term Goal 3 - Progress (Week 1): Other (comment) (new goal) PT Short Term Goal 4 (Week 1): Pt will navigate 4 steps using HRs with no more than CGA  Skilled Therapeutic Interventions/Progress Updates:      Therapy Documentation Precautions:  Precautions Precautions: Fall Precaution Comments: decreased stand balance- reliant on UE, Pain in LLE limits mobility at times Required Braces or Orthoses: Other Brace Other Brace: B adjustable night splints; B elbow "cosey" splints; L palm protector; R mitt Restrictions Weight Bearing Restrictions Per Provider Order: No  Pt received semi-reclined in bed, agreeable to additional PT session with emphasis on NMR, balance, and flexibility training. Pt with unrated left ankle pain, provided rest breaks for relief. Pt supervision with sit<>stand and CGA gait ~150 ft x 2 to main gym with RW. Pt performed dribbling and bounce pass to additional assist requiring CGA/min A for balance. Pt able to progress to bounce pass with alternating step min A. Pt transitioned to flexibility training and performed yoga (warrior I and II) with mod cues for technique and upright posture. Pt performed wall stretch for pectoralis major and minor and "W" stretch in hooklying  for improve mobility. Pt ambulated to room and left seated at bedside with all needs in reach and alarm on.     Therapy/Group: Individual Therapy  Truitt Leep Truitt Leep PT, DPT  05/14/2023, 3:53 PM

## 2023-05-14 NOTE — Progress Notes (Signed)
Patient ID: Nicole [name], female   DOB: 1976-01-07, 47 y.o.   MRN: [id number]  Reached out to [facility] and have no offers. Pt continues to make progress in her therapies.

## 2023-05-14 NOTE — Progress Notes (Signed)
Speech Language Pathology Daily Session Note  Patient Details  Name: Nicole [name] MRN: [id number] Date of Birth: [birth year]  Today's Date: 05/14/2023 SLP Individual Time: 6962-9528 SLP Individual Time Calculation (min): 43 min  Short Term Goals: Week 9: SLP Short Term Goal 1 (Week 9): Week 25 - Pt will solve mildly complex environmental problems with 60% accuracy given min assist. SLP Short Term Goal 2 (Week 9): Week 25 - Pt will recall basic daily information using external aids with 90% accuracy given mod assist. SLP Short Term Goal 3 (Week 9): Week 25 - Pt will utilize recommended handwriting strategies to achieve 90% legebility given mod verbal cues in 4/5 opportunities at the phrase and sentence levels. SLP Short Term Goal 4 (Week 9): Week 25 - Pt will utilize increased vocal intensity w/ min verbal cues to remain 90% intelligibile at conversation level in noisy environments in 4/5 opportunities.  Skilled Therapeutic Interventions: SLP conducted skilled therapy session targeting cognitive retraining and communication goals. SLP guided patient through various functional cognitive tasks targeting map interpretation, money math, and basic arithmetic. Patient excelled with map task, requiring supervision-min assist overall for 100% accuracy. Patient exhibited cognitive breakdowns during problem solving of repositioning in bed and during math/finance problems. Patient benefited from mod cues to slow rate and to reduce impulsivity during financial problem solving and max verbal cues to solve mildly complex arithmetic. Patient benefited from use of a number line to recall basic addition/substraction to assist with overall solving of each problem. Patient was left in lowered bed with call bell in reach and bed alarm set. SLP will continue to target goals per plan of care.       Pain Pain Assessment Pain Scale: 0-10 Pain Score: 7  Pain Location: Knee (leg and knees) Pain Orientation:  Right;Left Pain Descriptors / Indicators: Operative site guarding Pain Onset: On-going Pain Intervention(s): Medication (See eMAR)  Therapy/Group: Individual Therapy  [name], M.A., CCC-SLP  [name] 05/14/2023, 9:30 AM

## 2023-05-14 NOTE — Progress Notes (Signed)
Nutrition Follow-up  DOCUMENTATION CODES:   Not applicable  INTERVENTION:   -Continue dysphagia III diet with encouragement.  -Resume Ensure Enlive po BID, each supplement provides 350 kcal and 20 grams of protein.    NUTRITION DIAGNOSIS:   Increased nutrient needs related to acute illness, other (see comment), wound healing (prolonged hospitalization, previously severe malnutrition) as evidenced by estimated needs.  -ongoing, addressing with diet and oral supplements  GOAL:   Patient will meet greater than or equal to 90% of their needs  -meeting goal  MONITOR:   PO intake, Labs, Supplement acceptance, Weight trends, Skin  REASON FOR ASSESSMENT:   Follow up    ASSESSMENT:   47 y.o. 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 PMH includes: anxiety, asthma, depression, drug abuse.  Patient is out of room x 2 attempts to visit. Chart reviewed. Intakes recorded average 96% x 6 meals.  Ensure Enlive was discontinued again, suspect due to inaccurate weight entered with a subsequent incorrect BMI meeting criteria for Class III obesity. Patient was re-weighed today per RN.  Weight is up 3.1 kg from 12/02/22 which is desired, she is working and progressing with therapies.     Medications reviewed and include B-complex with vitamin C-1 Tab daily, vitamin D3 25 mcg daily, magnesium gluconate 500 mg daily at bedtime.  Labs reviewed   Diet Order:   Diet Order             DIET DYS 3 Room service appropriate? Yes with Assist; Fluid consistency: Thin  Diet effective now                   EDUCATION NEEDS:   Not appropriate for education at this time  Skin:   Skin Assessment: Skin Integrity Issues: Skin Integrity Issues:: Other (Comment) Other: erythema/redness to sacrum and heel  Last BM:  05/12/23, type 4-large  Height:   Ht Readings from Last 1 Encounters:  11/18/22  5\' 1"  (1.549 m)    Weight:   Wt Readings from Last 1 Encounters:  05/14/23 50.5 kg    Ideal Body Weight:  47.7 kg  BMI:  Body mass index is 21.04 kg/m.  Estimated Nutritional Needs:   Kcal:  1600-1800  Protein:  90-110 gm  Fluid:  >/= 1.6 L    [name], RDLD Clinical Dietitian If unable to reach, please contact "[facility] Inpatient" secure chat group between 8 am-4 pm daily"

## 2023-05-14 NOTE — Progress Notes (Signed)
Sleepy until 0430 this morning. Did NOT give PRN melatonin. Large continent/incontinent void this AM, after 8 hours, no void. Foam dressing in place to sacrum. Refused splints at HS. SCD's applied without complaint of.Nicole [name]

## 2023-05-15 NOTE — Progress Notes (Signed)
 Occupational Therapy Session Note  Patient Details  Name: Nicole [name] MRN: [id number] Date of Birth: February 22, 1976  Today's Date: 05/15/2023 OT Individual Time: 9182-9140 OT Individual Time Calculation (min): 42 min  OT Individual Time: 8694-8654 OT Individual Time Calculation (min): 40 min  Short Term Goals: Week 4:  OT Short Term Goal 1 (Week 4): Patient will motor plan to transition from supine to sitting from flat surface with min assist OT Short Term Goal 1 - Progress (Week 4): Other (comment) OT Short Term Goal 2 (Week 4): Patient will OT Short Term Goal 2 - Progress (Week 4): Other (comment) OT Short Term Goal 3 (Week 4): Patient will don left sock with min assist OT Short Term Goal 3 - Progress (Week 4): Other (comment) OT Short Term Goal 4 (Week 4): Patient will trial wearing underwear during weekday x 2 OT Short Term Goal 4 - Progress (Week 4): Other (comment) OT Short Term Goal 5 (Week 4): Patient will grab hair to place in ponytail with BUE OT Short Term Goal 5 - Progress (Week 4): Other (comment)  Skilled Therapeutic Interventions/Progress Updates:     AM Session:  Pt received sitting up in be presenting to be in good spirits receptive to skilled OT session reporting pain 2/2 to headache with RN informed and in/out during therapy session- OT offering intermittent rest breaks, repositioning, and therapeutic support to optimize participation in therapy session. Focus this session BADL retraining to increase [name] independence and safety during BADLs and decrease overall bourdon of care. Pt transitioned to EOB without bed features CGA and min verbal cues for technique. Pt completed U/LB dressing tasks seated EOB for increased dynamic sitting balance challenge. Assistance required when doffing shirt d/t tight fit of long sleeve shirt. Pt donned clean shirt with CGA provided for sitting balance +increased time. Pt able to weave B LEs into feet with CGA +increased time and stand using RW  to bring pants to waist CGA provided for balance. Pt then requesting to use restroom. Pt completed functional mobility to BR using RW with CGA provided for balance. Provided increased time on toilet, however no void or BM at this time. Provided Pt with wash cloths with pt able to perform peri-care and bring pants to waist in standing position with assistance required to bring pants up to knees to allow for Pt to grab pants to bring them to waist. Pt ambulated to sink and stood with CGA using RW while brushing teeth, washing face, washing hands, and grooming hair. In seated position, worked on [employer] her hair and bringing hair into ponytail. Pt attempted multiple times to bring hair into ponytail, however unsuccessful with donning hair tie so OT provided assistance. Engaged Pt in completing functional mobility >150 ft x2 trials using RW for endurance and functional mobility training with emphasis on head positioning and RW management. Pt required CGA overall and mod verbal cues for head positioning. Pt returned to bed with min A. Pt was left resting in bed with call bell in reach, bed alarm on, 4 bed rails up, and all needs met.    PM Session:  Pt received semi-reclined in bed upon OT arrival presenting to be in good spirits receptive to skilled OT session reporting 0/10 pain at rest- OT offering intermittent rest breaks, repositioning, and therapeutic support to optimize participation in therapy session. Pt reporting she needed a clean brief upon OT arrival d/t having an accident in bed. Pt transitioned to EOB using bed rail with  CGA and completed functional mobility to BR using RW CGA with min verbal cues required for RW positioning when turning to sit on toilet. Pt doffed clothing in seated position with close supervision. Provided Pt with wash cloths with pt able to complete anterior peri-care seated supervision and stand using RW to complete posterior peri-care in standing position with CGA. Pt donned  clean shirt with supervision +increased time and min verbal cues for technique. Pt donned clean pants seated on toilet with assistance required to weave feet d/t tight fit of leggings. Pt stood with CGA to bring pants to waist. Pt ambulated to sink and maintained standing balance with CGA using RW while washing hands. Sitting EOB, engaged Pt in FM/VM writing activity with Pt instructed to write 10 goals she would like to work towards during the new year. Focused task on writing eligible with mod-max verbal cues required during task for pacing and pen positioning. Pt utilized pen with built-up handle during task to increase control and pen positioning. Stand pivot EOB>wc using RW CGA. Pt was left resting in wc with call bell in reach, chair alarm on, telesitter on, and all needs met.    Therapy Documentation Precautions:  Precautions Precautions: Fall Precaution Comments: decreased stand balance- reliant on UE, Pain in LLE limits mobility at times Required Braces or Orthoses: Other Brace Other Brace: B adjustable night splints; B elbow cosey splints; L palm protector; R mitt Restrictions Weight Bearing Restrictions Per Provider Order: No   Therapy/Group: Individual Therapy  LORYN HAACKE 05/15/2023, 7:32 AM

## 2023-05-15 NOTE — Progress Notes (Signed)
 Speech Language Pathology Daily Session Note  Patient Details  Name: Nicole [name] MRN: [id number] Date of Birth: [birth year]  Today's Date: 05/15/2023 SLP Individual Time: 0901-0959 SLP Individual Time Calculation (min): 58 min  Short Term Goals: Week 9: SLP Short Term Goal 1 (Week 9): Week 25 - Pt will solve mildly complex environmental problems with 60% accuracy given min assist. SLP Short Term Goal 2 (Week 9): Week 25 - Pt will recall basic daily information using external aids with 90% accuracy given mod assist. SLP Short Term Goal 3 (Week 9): Week 25 - Pt will utilize recommended handwriting strategies to achieve 90% legebility given mod verbal cues in 4/5 opportunities at the phrase and sentence levels. SLP Short Term Goal 4 (Week 9): Week 25 - Pt will utilize increased vocal intensity w/ min verbal cues to remain 90% intelligibile at conversation level in noisy environments in 4/5 opportunities.  Skilled Therapeutic Interventions:  Patient was seen in am to address cognitive re- training and speech intelligibility. Pt was alert and seen at bedside. She endorsed headache post fall yesterday PM. Due to headache pt requested to remain in the room for session. Pt challenged in recall of therapy schedule, abstract reasoning through a compare and contrast activiity and an odd one out task. Pt was also challenged in speech intelligibility in a nosy environment. TV was left on during session and door open to allow for environmental stimulation. Pt recalled therapy schedule given 5, 10, and 20 minute increments with accuracy improving from 67% and min A to 100% indep after re- training and frequent repetition. She completed an abstract reasoning task benefiting from min A initially improving to sup A and >90% accuracy. Pt 100% intelligible throughout session however; environmental noise was limited due to remaining in pt room for session. SLP also addressed handwriting through establishing visual breaks  to facilitate spacing and for pacing. Pt with improved spacing and pacing between words however. fast pace throughout a single word persisted despite max cues. [name] handwriting subjectively judged to be ~90% intelligible with min A. At conclusion of session pt was left in bed with call button within reach and bed alarm active. SLP to continue POC.   Pain Pain Assessment Pain Scale: 0-10 Pain Score: 3  Pain Type: Acute pain Pain Location: Head Pain Orientation: Right;Left Pain Descriptors / Indicators: Aching Pain Frequency: Intermittent Pain Onset: Gradual Pain Intervention(s): Medication (See eMAR)  Therapy/Group: Individual Therapy  [name] 05/15/2023, 10:00 AM

## 2023-05-15 NOTE — Plan of Care (Signed)
 Goals upgraded to due functional progress with sitting/standing balance, endurance, and awareness- HK 12/31  Problem: RH Balance Goal: LTG: Patient will maintain dynamic sitting balance (OT) Description: LTG:  Patient will maintain dynamic sitting balance with assistance during activities of daily living (OT) Flowsheets (Taken 05/15/2023 1216) LTG: Pt will maintain dynamic sitting balance during ADLs with: (upgraded to due functional progress with sitting/standing balance, endurance, and awareness) Supervision/Verbal cueing   Problem: RH Grooming Goal: LTG Patient will perform grooming w/assist,cues/equip (OT) Description: LTG: Patient will perform grooming with assist, with/without cues using equipment (OT) Flowsheets (Taken 05/15/2023 1216) LTG: Pt will perform grooming with assistance level of: (upgraded to due functional progress with sitting/standing balance, endurance, and awareness) Supervision/Verbal cueing   Problem: RH Bathing Goal: LTG Patient will bathe all body parts with assist levels (OT) Description: LTG: Patient will bathe all body parts with assist levels (OT) Flowsheets (Taken 05/15/2023 1216) LTG: Pt will perform bathing with assistance level/cueing: (upgraded to due functional progress with sitting/standing balance, endurance, and awareness) Supervision/Verbal cueing   Problem: RH Dressing Goal: LTG Patient will perform upper body dressing (OT) Description: LTG Patient will perform upper body dressing with assist, with/without cues (OT). Flowsheets (Taken 05/15/2023 1216) LTG: Pt will perform upper body dressing with assistance level of: (upgraded to due functional progress with sitting/standing balance, endurance, and awareness) Set up assist Goal: LTG Patient will perform lower body dressing w/assist (OT) Description: LTG: Patient will perform lower body dressing with assist, with/without cues in positioning using equipment (OT) Flowsheets (Taken 05/15/2023  1216) LTG: Pt will perform lower body dressing with assistance level of: (upgraded to due functional progress with sitting/standing balance, endurance, and awareness) Contact Guard/Touching assist   Problem: RH Toileting Goal: LTG Patient will perform toileting task (3/3 steps) with assistance level (OT) Description: LTG: Patient will perform toileting task (3/3 steps) with assistance level (OT)  Flowsheets (Taken 05/15/2023 1216) LTG: Pt will perform toileting task (3/3 steps) with assistance level: (upgraded to due functional progress with sitting/standing balance, endurance, and awareness) Contact Guard/Touching assist   Problem: RH Toilet Transfers Goal: LTG Patient will perform toilet transfers w/assist (OT) Description: LTG: Patient will perform toilet transfers with assist, with/without cues using equipment (OT) Flowsheets (Taken 05/15/2023 1216) LTG: Pt will perform toilet transfers with assistance level of: (upgraded to due functional progress with sitting/standing balance, endurance, and awareness) Supervision/Verbal cueing   Problem: RH Tub/Shower Transfers Goal: LTG Patient will perform tub/shower transfers w/assist (OT) Description: LTG: Patient will perform tub/shower transfers with assist, with/without cues using equipment (OT) Flowsheets (Taken 05/15/2023 1216) LTG: Pt will perform tub/shower stall transfers with assistance level of: (upgraded to due functional progress with sitting/standing balance, endurance, and awareness) Contact Guard/Touching assist

## 2023-05-15 NOTE — Patient Care Conference (Signed)
 Inpatient RehabilitationTeam Conference and Plan of Care Update Date: 05/15/2023   Time: 12:09 PM    Patient Name: Nicole [name]      Medical Record Number: [id number]  Date of Birth: June 12, 1975 Sex: Female         Room/Bed: 4W09C/4W09C-01 Payor Info: Payor: [employer] / Plan: [employer] / Product Type: *No Product type* /    Admit Date/Time:  11/18/2022  3:15 PM  Primary Diagnosis:  Hypoxic brain injury [facility])  Hospital Problems: Principal Problem:   Hypoxic brain injury (HCC) Active Problems:   Protein-calorie malnutrition, severe   Essential hypertension   Pressure injury of skin    Expected Discharge Date: Expected Discharge Date:  (SNF pending)  Team Members Present: Physician leading conference: Dr. Duwaine Barrs Social Worker Present: Rhoda Clement, LCSW Nurse Present: Barnie Ronde, RN PT Present: Sherlean Perks, PT OT Present: Lorrayne Fritter, OT SLP Present: Joane Fuss, SLP PPS Coordinator present : Eleanor Colon, SLP     Current Status/Progress Goal Weekly Team Focus  Bowel/Bladder   Mostly continent of BM. Still having issues with urinary incontinence, even with timed toileting.  patient started on Bactrim  DS over weekend. Pyridium  started on 12/30.   continent of B & B 75% of time.   continue with timed toileting.    Swallow/Nutrition/ Hydration              ADL's   Min A BADLs, needs cueing for attention and apraxia   Goals min overall; may need to upgrade   functional mobility, use of LUE/LLE, Balance, shoulder Ranghe of motion, use of LUE    Mobility   minA bed mobility, CGA sit<>Stand and stand pivot transfers, CGA gait >261ft using RW, minA for navigating 12+ stairs. Decreased saftey awareness, apraxic, and mild impulsivity   Goals upgraded to supervision  Functional gait, dynamic balance, core strengthening, safety training, pt ed    Communication   decreased vocal intensity, especially in louder environments, modI for  quiet environment, modA for use of strategies in environments with ambient noise.   modi   consistent speech intelligibility across settings/situations    Safety/Cognition/ Behavioral Observations  modI-supervision for orientation, mod-max for mildly complex FUNCTIONAL problem solving, min for vebral problem solving, supervision for schedule management/anticipation of daily therapy schedule using external aid   supervision -minA   continue to address basic-mildly complex problem solving, sustained attention, handwriting, memory of daily events.    Pain   pain managed with current regimen   free of pain   Assess pain qshift and PRN.    Skin   Healing wound to  buttocks-gerhardts butt cream applied and repositioned   No further skin breakdown  Assess skin q shift and PRN. Educated patient on staying off bottom while in bed and keeping heels elevated      Discharge Planning:  Continue to await disabliyt approval so can get a bed, no offfers without her disability apporved as of yet. LOG available but no offers   Team Discussion: Patient post hypoxic brain injury; working on core stability, dynamic balance, apraxia, shoulder ROM and vocal intensity strategies.   Patient on target to meet rehab goals: yes, currently needs CGA overall using a RW. Needs min assist fro ADLs and cues for apraxia. Needs moderate cues for use of vocal strength strategies.   *See Care Plan and progress notes for long and short-term goals.   Revisions to Treatment Plan:  Dysuria; addressed with medication/Urology consult for ? Vaginal  tear.   Teaching Needs: Safety, medications,skin care, transfers, toileting, etc.   Current Barriers to Discharge: Incontinence, Wound care, and Lack of/limited family support  Possible Resolutions to Barriers: SNF recommended     Medical Summary Current Status: Pt having dysuria; on ABX as well as pyridium - no improvement- might have vaginal/mons tear that's  burning?  Barriers to Discharge: Behavior/Mood;Self-care education;Complicated Wound  Barriers to Discharge Comments: dispo- poor memory, dysuria Possible Resolutions to [employer] Focus: started pyridium  for dysuria, also has been started on PO ABX- U/A (-)- d/c?   Continued Need for Acute Rehabilitation Level of Care: The patient requires daily medical management by a physician with specialized training in physical medicine and rehabilitation for the following reasons: Direction of a multidisciplinary physical rehabilitation program to maximize functional independence : Yes Medical management of patient stability for increased activity during participation in an intensive rehabilitation regime.: Yes Analysis of laboratory values and/or radiology reports with any subsequent need for medication adjustment and/or medical intervention. : Yes   I attest that I was present, lead the team conference, and concur with the assessment and plan of the team.   Fredericka Sober B 05/15/2023, 4:41 PM

## 2023-05-15 NOTE — Progress Notes (Signed)
 Patient upset with herself for falling again. Vitals per protocol. Denies pain.  Spoke with Rolan, PA this morning @0535 , ok to stop q2 hour vitals, check per routine. moderate BM at bedtime. PRN dulcolax tab given on previous shift. Continent all night so far!! Urine orange from pyridium . Refused all splints/boots. Agreed to wear SCD's. Requested and given buspar  at 2125. Shyane Fossum A

## 2023-05-15 NOTE — Progress Notes (Signed)
 Physical Therapy Session Note  Patient Details  Name: Nicole [name] MRN: [id number] Date of Birth: [birth year]  Today's Date: 05/15/2023 PT Individual Time: 1000-1113 PT Individual Time Calculation (min): 73 min   Short Term Goals: Week 1:  PT Short Term Goal 1 (Week 1): Pt will perform supine<>sit with supervision PT Short Term Goal 1 - Progress (Week 1): Other (comment) (new goal) PT Short Term Goal 2 (Week 1): Pt will ambulate at least 166ft with no more than min A, not using an AD. PT Short Term Goal 2 - Progress (Week 1): Other (comment) (new goal) PT Short Term Goal 3 (Week 1): Pt will demonstrate decreased fall risk as noted by an improvement of at least 7 points on the [employer] Test PT Short Term Goal 3 - Progress (Week 1): Other (comment) (new goal) PT Short Term Goal 4 (Week 1): Pt will navigate 4 steps using HRs with no more than CGA  Skilled Therapeutic Interventions/Progress Updates:       Pt lying in bed to start - no reports of pain. Aware of mistake of falling OOB yesterday while she was reaching for a papertowel on the floor while she was sitting EOB. Pt upset with herself that she did this. Educated on fall precautions and patient voiced understanding.   Bed mobility completed with supervision while using hospital bed features. Sit<>stand to RW with CGA and she ambulates to 77M nurses station with CGA and RW, ~179ft. Pt requesting a cup of coffee - prepared for her per CIR policy.   Ambulated from CIR downstairs to [facility] lobby (standing rest break in elevator) with CGA and RW, ~336ft. Instructed patient in ordering a coffee from [employer] to help with facilitating return to community integration. Pt needing min cues for vocalization and vocal intensity while ordering. She then worked on photographer with mod cues while preparing the coffee with sugar and creamer.   Gait training outdoors with RW and CGA/minA - cues for safety awareness  to monitor unlevel terrain, cracks and slopes in the pavement, etc. ~29ft  Gift shop navigation with RW and CGA - safety cues for social awareness, turning in tight spaces, and reaching for various objects in the gift shop.   Returned upstairs to [employer] floor. Practiced reaching for floor level objects from sitting EOM position. Pt requiring CGA for safety while reaching. Discussed strategies such as kicking things out of reach, closer for her to have more stability while reaching. Bi-manual tasks for cleaning objects from the floor using gloves and purple wipes.   Ended treatment in bed with alarm on. Bed rails up, per her request. Left with call bell in lap.   Therapy Documentation Precautions:  Precautions Precautions: Fall Precaution Comments: decreased stand balance- reliant on UE, Pain in LLE limits mobility at times Required Braces or Orthoses: Other Brace Other Brace: B adjustable night splints; B elbow cosey splints; L palm protector; R mitt Restrictions Weight Bearing Restrictions Per Provider Order: No General:      Therapy/Group: Individual Therapy  Antionette Luster P Alzina Golda 05/15/2023, 7:45 AM

## 2023-05-15 NOTE — Progress Notes (Signed)
 PROGRESS NOTE   Subjective/Complaints:  LBM last evening- medium type 3-  Denies any more abd pain- has resolved- likely due to constipation she had yesterday.   Still having burning when pees, in spite of Pyridium  Says it's from the time she starts peeing to end of urination. No change in burning.   ROS:    Pt denies SOB, abd pain, CP, N/V/C/D, and vision changes   +anxiety and depression- improved, improved initiation    Objective:   CT HEAD WO CONTRAST ( ) Result Date: 05/14/2023 CLINICAL DATA:  Fall EXAM: CT HEAD WITHOUT CONTRAST TECHNIQUE: Contiguous axial images were obtained from the base of the skull through the vertex without intravenous contrast. RADIATION DOSE REDUCTION: This exam was performed according to the departmental dose-optimization program which includes automated exposure control, adjustment of the mA and/or kV according to patient size and/or use of iterative reconstruction technique. COMPARISON:  03/01/2023 FINDINGS: Brain: No mass,hemorrhage or extra-axial collection. Chronic hypodensity within the globi pallidi compatible with prior toxic/metabolic injury. Vascular: No hyperdense vessel or unexpected vascular calcification. Skull: The visualized skull base, calvarium and extracranial soft tissues are normal. Sinuses/Orbits: No fluid levels or advanced mucosal thickening of the visualized paranasal sinuses. No mastoid or middle ear effusion. Normal orbits. Other: None. IMPRESSION: 1. No acute intracranial abnormality. 2. Chronic hypodensity within the globi pallidi compatible with prior toxic/metabolic injury. Electronically Signed   By: Franky Stanford M.D.   On: 05/14/2023 20:59   DG Abd 1 View Result Date: 05/14/2023 CLINICAL DATA:  Abdominal pain EXAM: ABDOMEN - 1 VIEW COMPARISON:  05/01/2023 FINDINGS: Two supine frontal views of the abdomen and pelvis are obtained. No bowel obstruction or ileus.  Minimal stool within the rectal vault. No masses or abnormal calcifications. IUD projects over the pelvis. Lung bases are clear. IMPRESSION: 1. Unremarkable bowel gas pattern. Electronically Signed   By: Ozell Daring M.D.   On: 05/14/2023 18:47    No results for input(s): WBC, HGB, HCT, PLT in the last 72 hours.        No results for input(s): NA, K, CL, CO2, GLUCOSE, BUN, CREATININE, CALCIUM  in the last 72 hours.         Intake/Output Summary (Last 24 hours) at 05/15/2023 0840 Last data filed at 05/15/2023 0709 Gross per 24 hour  Intake 1072 ml  Output --  Net 1072 ml           Physical Exam: Vital Signs Blood pressure 103/70, pulse 72, temperature 98.1 F (36.7 C), temperature source Oral, resp. rate 18, height 5' 1 (1.549 m), weight 50.5 kg, SpO2 95%.      General: awake, alert, appropriate, sitting on toilet- voiding; NT at bedside; NAD HENT: conjugate gaze; oropharynx moist CV: regular rate and rhythm; no JVD Pulmonary: CTA B/L; no W/R/R- good air movement GI: soft, NT, ND, (+)BS- more normoactive Psychiatric: appropriate- very slightly flat Neurological: poor memory- doesn't remember LBM or CT/KUB done yesterday PRIOR EXAMS: Skin: + Scaling dried skin on bilateral heels left buttock unstageable - not examined + Left heel stage III DTI has resolved Neuro: memory seeming to improve lately Neurologic: AAOx2, fair insight and awareness..  + Hypophonation, speech  intelligability much improved. Fair insight. MAS 1 right elbow,MAS  tr to 1 in RIght finger and wrist flexors  MAS 1 in extensors of LE's MAS 1 L elbow , MAS 1+ in L wrist and finger flexors. Hypersensitivity to left hand improved, Left sided strength improved, decreased range of motion and pain at end range of motion in left shoulder  Musculoskeletal: limited bilateral shoulder and elbow ROM due to increased tone ,right shoulder pain with PROM--stable 12/26      Assessment/Plan: 1. Functional deficits which require 3+ hours per day of interdisciplinary therapy in a comprehensive inpatient rehab setting. Physiatrist is providing close team supervision and 24 hour management of active medical problems listed below. Physiatrist and rehab team continue to assess barriers to discharge/monitor patient progress toward functional and medical goals  Care Tool:  Bathing    Body parts bathed by patient: Face, Abdomen, Chest, Left upper leg, Right upper leg, Left arm, Front perineal area, Right arm, Right lower leg, Left lower leg, Buttocks   Body parts bathed by helper: Buttocks Body parts n/a: Right arm, Left arm, Front perineal area, Buttocks, Right upper leg, Left upper leg, Right lower leg, Left lower leg   Bathing assist Assist Level: Contact Guard/Touching assist     Upper Body Dressing/Undressing Upper body dressing   What is the patient wearing?: Pull over shirt    Upper body assist Assist Level: Set up assist    Lower Body Dressing/Undressing Lower body dressing      What is the patient wearing?: Incontinence brief, Pants     Lower body assist Assist for lower body dressing: Minimal Assistance - Patient > 75%     Toileting Toileting Toileting Activity did not occur (Clothing management and hygiene only): N/A (no void or bm)  Toileting assist Assist for toileting: Minimal Assistance - Patient > 75%     Transfers Chair/bed transfer  Transfers assist  Chair/bed transfer activity did not occur: Safety/medical concerns  Chair/bed transfer assist level: Supervision/Verbal cueing Chair/bed transfer assistive device: Geologist, Engineering   Ambulation assist   Ambulation activity did not occur: Safety/medical concerns  Assist level: Contact Guard/Touching assist Assistive device: Walker-rolling Max distance: 150   Walk 10 feet activity   Assist  Walk 10 feet activity did not occur: Safety/medical  concerns  Assist level: Contact Guard/Touching assist Assistive device: Walker-rolling   Walk 50 feet activity   Assist Walk 50 feet with 2 turns activity did not occur: Safety/medical concerns  Assist level: Contact Guard/Touching assist Assistive device: Walker-rolling    Walk 150 feet activity   Assist Walk 150 feet activity did not occur: Safety/medical concerns  Assist level: Contact Guard/Touching assist Assistive device: Walker-rolling    Walk 10 feet on uneven surface  activity   Assist Walk 10 feet on uneven surfaces activity did not occur: Safety/medical concerns   Assist level: Minimal Assistance - Patient > 75% Assistive device: Walker-rolling   Wheelchair     Assist Is the patient using a wheelchair?: No Type of Wheelchair: Manual    Wheelchair assist level: Moderate Assistance - Patient 50 - 74% Max wheelchair distance: 65ft    Wheelchair 50 feet with 2 turns activity    Assist    Wheelchair 50 feet with 2 turns activity did not occur: Safety/medical concerns   Assist Level: Supervision/Verbal cueing   Wheelchair 150 feet activity     Assist  Wheelchair 150 feet activity did not occur: Safety/medical concerns   Assist Level: Moderate  Assistance - Patient 50 - 74%   Blood pressure 103/70, pulse 72, temperature 98.1 F (36.7 C), temperature source Oral, resp. rate 18, height 5' 1 (1.549 m), weight 50.5 kg, SpO2 95%.  Medical Problem List and Plan: 1. 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 Awaiting approval of disability. Placement pending. Hoping that hospital will provide LOG for  placement. Code status  DNR Con't CIR PT, OT and SLP   Team conference 12/31  Have discussed prognosis with mom, will discuss with patient  [place] pass ordered  2.  Impaired mobility: d/c lovenox  since ambulating >150 feet, SCDs ordered  3. Knee pain: Provided list of foods for pain. Tylenol  as needed, continue voltaren  gel- changed to prn. XR reviewed and is stable, discussed with therapy -Right wrist swelling: XR ordered and shows no acute fracture, shows 2.50mm ulnar variance, brace removed to given patient a break from it. Voltaren  gel ordered prn for discomfort, continue, CT ordered and show no acute osseus injury, continue ace wrap   -Foot pain: tramadol  ordered prn, improved today, continue prn  -Headaches:continue tylenol  and tramadol  prn -Low back pain: continue kpad, asked [place]  to please bring for her -Whole body hypersensitivity: gabapentin  100mg  TID ordered -Pain in left heel: XR obtained and negative, cam boot ordered to help offload heel with therapy, discussed that this has helped -R shoulder pain: XR neg for acute etiology on 04/06/23, voltaren  gel ordered--encourage ROM as tolerated 12/31- denying any pain except dysuria 4. History of anxiety: discussed with her mom that she feels this was a big risk factor for her accident, propanolol d/ced due to hypotension. Continue magnesium  supplement. Asked SW to place her on neuropsych list, Buspar  ordered prn, discussed placing outpatient referral for behavioral health f/u  5. Neuropsych/cognition: This patient is not capable of making decisions on her own behalf  - 10/12: Still not capable of medical decision making; BUT oriented and communicating, following directions - MUCH improved - PRN melatonin 3mg    for insomnia  12/31- took Buspar  prn last night 6. Skin: -Buttock/sacrum--foam dressing, pressure relief, nutrition -stage 3 left heel, continue foam dressing/ pressure relief  7. Fluids/Electrolytes/Nutrition: Routine in  and outs with follow-up chemistries  -Eating  well with cueing   -Dysphagia: continue D3/thins--tolerating this diet -Low protein stores: d/c prosource since patient hates these and is eating 100% of meals -Suboptimal vitamin D  level: increase to 5,000 U daily. continue this dose  -History of magnesium  deficiency: magnesium  500mg  stopped to support BP  -12/17 good po intake 8.  Cavitary right lower lobe pneumonia likely aspiration pneumonia/MRSA pneumonia.  Resolved    Latest Ref Rng & Units 05/02/2023    5:20 AM 04/18/2023    5:16 AM 04/04/2023    6:09 AM  CBC  WBC 4.0 - 10.5 K/uL 5.9  6.0  5.6   Hemoglobin 12.0 - 15.0 g/dL 85.1  85.9  85.8   Hematocrit 36.0 - 46.0 % 43.0  41.6  42.1   Platelets 150 - 400 K/uL 241  194  224      9.  History of drug abuse.  Positive cocaine on urine drug screen.  Provide counseling  10.  AKI/hypovolemia and ATN.  Resolved     Latest Ref Rng & Units 05/02/2023    5:20 AM 04/18/2023    5:16 AM 04/04/2023    6:09 AM  BMP  Glucose 70 - 99 mg/dL 89  79  91   BUN 6 - 20 mg/dL 14  17  20    Creatinine 0.44 - 1.00 mg/dL 9.20  9.32  9.30   Sodium 135 - 145 mmol/L 138  137  138   Potassium 3.5 - 5.1 mmol/L 4.0  3.8  3.9   Chloride 98 - 111 mmol/L 109  105  105   CO2 22 - 32 mmol/L 23  23  24    Calcium  8.9 - 10.3 mg/dL 8.9  9.0  9.3     11.  Mild transaminitis with rhabdomyolysis.  Both resolved  12.  Hypotension:  D/c amantadine  -12/28-29/24 BPs mostly stable, monitor 12/31- BP soft, but per therapy, not orthostatic Vitals:   05/14/23 1720 05/14/23 1747 05/14/23 1821 05/14/23 1922  BP: 110/76 118/89 (!) 101/56 104/76   05/14/23 2025 05/14/23 2123 05/14/23 2223 05/14/23 2322  BP: (!) 94/59 106/69 107/73 104/67   05/15/23 0053 05/15/23 0256 05/15/23 0504 05/15/23 0511  BP: 98/69 119/81 102/75 103/70    13. HTN: resolved   14. Impaired initiation: resolved, d/c amantadine   15. Hyperglycemia: CBGs mildly elevated, d/ced checks. Prosource  d/ced as per patient's request  16. Spasticity:  Continue wrist, elbow brace, and PRAFOs. Ordered a night splint for LLE  D/c baclofen  and tizanidine - much improved  17.  Dyskinetic movements in head and neck - appears to be myoclonic jerks related to hypoxic brain injury - as discussed with pt mom, no specific meds for this on antispasticity meds , consioder benzo although with pt's SUD would avoid this class - EEG reviewed and was negative for seizure.  -  resolved   18. Cervical extensor weakness: decrease tizanidine  to 2mg  HS, decrease baclofen  to 5mg  QHS, improved, d/c'd  19. Bowel and bladder incontinence: continue bowel and bladder program. D/c miralax , decrease tizanidine  to 2mg  HS, d/c'd  12/30- LBM 2 days ago- was large- co abd pain- will get KUB- if full of stool- will need to be cleaned out.   12/31- KUB looked OK- abd pain resolved- and LBM yesterday- medium 20. Fatigue: resolved, discussed with team may be secondary to anti-spasticity medications, B12 reviewed and is 335, in suboptimal range, will give 1,000U B12 injection 8/7, will order monthly as needed, metanx started, increase to BID, continue, d/c tizanidine , discussed with patient, d/c baclofen    21. MRSA nares positive: negative on repeat, precautions d/ced  22. Tachycardic: resolved, d/c propanolol. Continue tizanidine  2mg  HS. Resolved, amantadine  increased back to 100mg  BID, continue this dose, resolved  23. S/p fall: CT head reviewed and is stable  24. Bilateral foot drop: bilateral AFOs ordered, discussed that these can help her to walk better  25. Vaginal itching: completed fluconazole . Having some discharge now. See below  26. Dysuria ->100k E Coli UTI: 11/28 sensitive to amoxil , completed 1 week course -05/12/23 having dysuria and urinary hesitancy, recheck U/A today, asked nursing to please help her get changed when wet.  -05/13/23 U/A fairly unremarkable, but pt with persisting UTI type symptoms; will  proceed with presumptive tx with Bactrim  DS BID x3 days, if still unresolved then may need to further evaluate this issue; timed toileting apparently not helping much with incontinence, per nursing pt stayed on toilet for without urination, and then  went on to wet brief immediately after. Hopefully tx for UTI will help.   12/30- added Pyridium  100 mg TID x 2 days- to help dysuria. 12/31- no change in Dysuria- per nursing, has had since before Xmas- might be tear in mons/vaginal mucosa that's stinging when voids-   27. Poor sitting balance: continue OT/PT, improving  28. Headaches: topamax  48m daily prn ordered, continue  29. Drowsiness with tramadol : d/ced  30. IUD in place? Pt states she has IUD x63yrs  and believes it's supposed to be removed soon(chart review- Dr. Lavoie placed Mirena  IUD on 07/01/19, good for 56yrs); needs outpatient follow-up with OB/GYN  31. Constipation: d/c baclofen , last BM 12/18, milk of magnesia ordered 12/20, LBM 12/28, continue magnesium  gluconate  12/30- LBM 12/28- but c/o abd pain- get KUB to see if needs to give Sorbitol .  32. History of drug addiction: tramadol  d/ced as per mom's preference  33. Depression: discussed scheduling for outpatient psychology/psychiatry follow-up, discussed neuropsych f/u while in hospital  34. Sinusitis: claritin  ordered prn, continue      I spent a total of 37   minutes on total care today- >50% coordination of care- due to  Team conference paperwork; also d/w nursing about dysuria; and with pt about dysuria/bowels and review of chart.   LOS: 178 days A FACE TO FACE EVALUATION WAS PERFORMED  Nicole [name] 05/15/2023, 8:40 AM

## 2023-05-16 DIAGNOSIS — F0789 Other personality and behavioral disorders due to known physiological condition: Secondary | ICD-10-CM

## 2023-05-16 DIAGNOSIS — F1411 Cocaine abuse, in remission: Secondary | ICD-10-CM

## 2023-05-16 DIAGNOSIS — F418 Other specified anxiety disorders: Secondary | ICD-10-CM

## 2023-05-16 LAB — BASIC METABOLIC PANEL
Anion gap: 9 (ref 5–15)
BUN: 15 mg/dL (ref 6–20)
CO2: 22 mmol/L (ref 22–32)
Calcium: 8.9 mg/dL (ref 8.9–10.3)
Chloride: 105 mmol/L (ref 98–111)
Creatinine, Ser: 0.75 mg/dL (ref 0.44–1.00)
GFR, Estimated: >60 mL/min (ref >60)
Glucose, Bld: 87 mg/dL (ref 70–99)
Potassium: 3.9 mmol/L (ref 3.5–5.1)
Sodium: 136 mmol/L (ref 135–145)

## 2023-05-16 LAB — CBC
HCT: 42.1 % (ref 36.0–46.0)
Hemoglobin: 14.9 g/dL (ref 12.0–15.0)
MCH: 33 pg (ref 26.0–34.0)
MCHC: 35.4 g/dL (ref 30.0–36.0)
MCV: 93.1 fL (ref 80.0–100.0)
Platelets: 300 10*3/uL (ref 150–400)
RBC: 4.52 MIL/uL (ref 3.87–5.11)
RDW: 12.3 % (ref 11.5–15.5)
WBC: 6.2 10*3/uL (ref 4.0–10.5)
nRBC: 0 % (ref 0.0–0.2)

## 2023-05-16 NOTE — Progress Notes (Signed)
 Speech Language Pathology Daily Session Note  Patient Details  Name: Nicole [name] MRN: [id number] Date of Birth: [birth year]  Today's Date: 05/16/2023 SLP Individual Time: 9199-9154 SLP Individual Time Calculation (min): 45 min  Short Term Goals: Week 9: SLP Short Term Goal 1 (Week 9): Week 25 - Pt will solve mildly complex environmental problems with 60% accuracy given min assist. SLP Short Term Goal 2 (Week 9): Week 25 - Pt will recall basic daily information using external aids with 90% accuracy given mod assist. SLP Short Term Goal 3 (Week 9): Week 25 - Pt will utilize recommended handwriting strategies to achieve 90% legebility given mod verbal cues in 4/5 opportunities at the phrase and sentence levels. SLP Short Term Goal 4 (Week 9): Week 25 - Pt will utilize increased vocal intensity w/ min verbal cues to remain 90% intelligibile at conversation level in noisy environments in 4/5 opportunities.  Skilled Therapeutic Interventions: Skilled therapy session focused on memory and hand writing goals. SLP faciliated session by reading aloud news stories. SLP then provided minA for patient to slow down and write large letters to summarize story on paper. SLP encouraged patient to answer comprehension questions about stories after 5-10 minute delay, in which she was 100% accurate given mod i A. Patient left in bed with alarm set and call bell in reach. Continue POC.   Pain None reported   Therapy/Group: Individual Therapy  Ahjanae Cassel M.A., CF-SLP 05/16/2023, 7:38 AM

## 2023-05-16 NOTE — Progress Notes (Signed)
 Occupational Therapy Session Note  Patient Details  Name: Nicole [name] MRN: [id number] Date of Birth: 1975/09/28  Today's Date: 05/16/2023 OT Individual Time: 1102-1200 OT Individual Time Calculation (min): 58 min    Short Term Goals: Week 4:  OT Short Term Goal 1 (Week 4): Patient will motor plan to transition from supine to sitting from flat surface with min assist OT Short Term Goal 1 - Progress (Week 4): Other (comment) OT Short Term Goal 2 (Week 4): Patient will OT Short Term Goal 2 - Progress (Week 4): Other (comment) OT Short Term Goal 3 (Week 4): Patient will don left sock with min assist OT Short Term Goal 3 - Progress (Week 4): Other (comment) OT Short Term Goal 4 (Week 4): Patient will trial wearing underwear during weekday x 2 OT Short Term Goal 4 - Progress (Week 4): Other (comment) OT Short Term Goal 5 (Week 4): Patient will grab hair to place in ponytail with BUE OT Short Term Goal 5 - Progress (Week 4): Other (comment)  Skilled Therapeutic Interventions/Progress Updates:    Patient received seated in wheelchair requesting a shower, then time to work on arms.  Walked to shower - patient indicated need to void and sat on commode for continent void.  Brief clean and dry.  Patient aware urine discolored due to medication.  Able to remove clothing - needing cues for safe process.  Patient starts out strong, but gets stuck with steps of task, and has difficulty generating solutions.  If given ample time, often able to resolve problems faced during ADL.  Showered on regular shower seat with back.  Patient able to bathe self with questioning cues to further progress steps.  Patient able to don both socks and shoes today!  Patient opting to wear underwear (vs incontinence brief) this session.  Patient has been clean and dry most days recently- team members aware to offer additional toileting.   Left up in wheelchair with chair pad alarm in place and engaged and personal items in reach.     Therapy Documentation Precautions:  Precautions Precautions: Fall Precaution Comments: decreased stand balance- reliant on UE, Pain in LLE limits mobility at times Required Braces or Orthoses: Other Brace Other Brace: B adjustable night splints; B elbow cosey splints; L palm protector; R mitt Restrictions Weight Bearing Restrictions Per Provider Order: No Pain: Pain Assessment Pain Score: 0-No pain   Therapy/Group: Individual Therapy  Wyett Narine M 05/16/2023, 1:54 PM

## 2023-05-16 NOTE — Progress Notes (Signed)
 PROGRESS NOTE   Subjective/Complaints:  Pt reports burning is gone  it's all good.  Also wants Pyridium  stopped (it is) but still changing color of urine- explained will go away today/tomorrow.   No issues otherwise  ROS:    Pt denies SOB, abd pain, CP, N/V/C/D, and vision changes   +anxiety and depression- improved, improved initiation    Objective:   CT HEAD WO CONTRAST ( ) Result Date: 05/14/2023 CLINICAL DATA:  Fall EXAM: CT HEAD WITHOUT CONTRAST TECHNIQUE: Contiguous axial images were obtained from the base of the skull through the vertex without intravenous contrast. RADIATION DOSE REDUCTION: This exam was performed according to the departmental dose-optimization program which includes automated exposure control, adjustment of the mA and/or kV according to patient size and/or use of iterative reconstruction technique. COMPARISON:  03/01/2023 FINDINGS: Brain: No mass,hemorrhage or extra-axial collection. Chronic hypodensity within the globi pallidi compatible with prior toxic/metabolic injury. Vascular: No hyperdense vessel or unexpected vascular calcification. Skull: The visualized skull base, calvarium and extracranial soft tissues are normal. Sinuses/Orbits: No fluid levels or advanced mucosal thickening of the visualized paranasal sinuses. No mastoid or middle ear effusion. Normal orbits. Other: None. IMPRESSION: 1. No acute intracranial abnormality. 2. Chronic hypodensity within the globi pallidi compatible with prior toxic/metabolic injury. Electronically Signed   By: Franky Stanford M.D.   On: 05/14/2023 20:59   DG Abd 1 View Result Date: 05/14/2023 CLINICAL DATA:  Abdominal pain EXAM: ABDOMEN - 1 VIEW COMPARISON:  05/01/2023 FINDINGS: Two supine frontal views of the abdomen and pelvis are obtained. No bowel obstruction or ileus. Minimal stool within the rectal vault. No masses or abnormal calcifications. IUD projects  over the pelvis. Lung bases are clear. IMPRESSION: 1. Unremarkable bowel gas pattern. Electronically Signed   By: Ozell Daring M.D.   On: 05/14/2023 18:47    Recent Labs    05/16/23 0628  WBC 6.2  HGB 14.9  HCT 42.1  PLT 300          Recent Labs    05/16/23 0628  NA 136  K 3.9  CL 105  CO2 22  GLUCOSE 87  BUN 15  CREATININE 0.75  CALCIUM  8.9           Intake/Output Summary (Last 24 hours) at 05/16/2023 9166 Last data filed at 05/15/2023 2200 Gross per 24 hour  Intake 720 ml  Output --  Net 720 ml           Physical Exam: Vital Signs Blood pressure 108/74, pulse 76, temperature 98.2 F (36.8 C), resp. rate 17, height 5' 1 (1.549 m), weight 50.5 kg, SpO2 97%.       General: awake, alert, appropriate, supine in bed- SLP came in at end of interview; moist CV: regular rate and rhythm; no JVD Pulmonary: CTA B/L; no W/R/R- good air movement GI: soft, NT, ND, (+)BS Psychiatric: appropriate- slightly flat- but interactive Neurological: alert-  PRIOR EXAMS: Skin: + Scaling dried skin on bilateral heels left buttock unstageable - not examined + Left heel stage III DTI has resolved Neuro: memory seeming to improve lately Neurologic: AAOx2, fair insight and awareness..  + Hypophonation, speech intelligability much improved. Fair insight.  MAS 1 right elbow,MAS  tr to 1 in RIght finger and wrist flexors  MAS 1 in extensors of LE's MAS 1 L elbow , MAS 1+ in L wrist and finger flexors. Hypersensitivity to left hand improved, Left sided strength improved, decreased range of motion and pain at end range of motion in left shoulder  Musculoskeletal: limited bilateral shoulder and elbow ROM due to increased tone ,right shoulder pain with PROM--stable 12/26     Assessment/Plan: 1. Functional deficits which require 3+ hours per day of interdisciplinary therapy in a comprehensive inpatient rehab setting. Physiatrist is providing close team supervision and  24 hour management of active medical problems listed below. Physiatrist and rehab team continue to assess barriers to discharge/monitor patient progress toward functional and medical goals  Care Tool:  Bathing    Body parts bathed by patient: Face, Abdomen, Chest, Left upper leg, Right upper leg, Left arm, Front perineal area, Right arm, Right lower leg, Left lower leg, Buttocks   Body parts bathed by helper: Buttocks Body parts n/a: Right arm, Left arm, Front perineal area, Buttocks, Right upper leg, Left upper leg, Right lower leg, Left lower leg   Bathing assist Assist Level: Contact Guard/Touching assist     Upper Body Dressing/Undressing Upper body dressing   What is the patient wearing?: Pull over shirt    Upper body assist Assist Level: Set up assist    Lower Body Dressing/Undressing Lower body dressing      What is the patient wearing?: Incontinence brief, Pants     Lower body assist Assist for lower body dressing: Minimal Assistance - Patient > 75%     Toileting Toileting Toileting Activity did not occur (Clothing management and hygiene only): N/A (no void or bm)  Toileting assist Assist for toileting: Minimal Assistance - Patient > 75%     Transfers Chair/bed transfer  Transfers assist  Chair/bed transfer activity did not occur: Safety/medical concerns  Chair/bed transfer assist level: Supervision/Verbal cueing Chair/bed transfer assistive device: Geologist, Engineering   Ambulation assist   Ambulation activity did not occur: Safety/medical concerns  Assist level: Contact Guard/Touching assist Assistive device: Walker-rolling Max distance: 150   Walk 10 feet activity   Assist  Walk 10 feet activity did not occur: Safety/medical concerns  Assist level: Contact Guard/Touching assist Assistive device: Walker-rolling   Walk 50 feet activity   Assist Walk 50 feet with 2 turns activity did not occur: Safety/medical concerns  Assist  level: Contact Guard/Touching assist Assistive device: Walker-rolling    Walk 150 feet activity   Assist Walk 150 feet activity did not occur: Safety/medical concerns  Assist level: Contact Guard/Touching assist Assistive device: Walker-rolling    Walk 10 feet on uneven surface  activity   Assist Walk 10 feet on uneven surfaces activity did not occur: Safety/medical concerns   Assist level: Minimal Assistance - Patient > 75% Assistive device: Walker-rolling   Wheelchair     Assist Is the patient using a wheelchair?: No Type of Wheelchair: Manual    Wheelchair assist level: Moderate Assistance - Patient 50 - 74% Max wheelchair distance: 19ft    Wheelchair 50 feet with 2 turns activity    Assist    Wheelchair 50 feet with 2 turns activity did not occur: Safety/medical concerns   Assist Level: Supervision/Verbal cueing   Wheelchair 150 feet activity     Assist  Wheelchair 150 feet activity did not occur: Safety/medical concerns   Assist Level: Moderate Assistance - Patient 50 -  74%   Blood pressure 108/74, pulse 76, temperature 98.2 F (36.8 C), resp. rate 17, height 5' 1 (1.549 m), weight 50.5 kg, SpO2 97%.  Medical Problem List and Plan: 1. 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 Awaiting approval of disability. Placement pending. Hoping that hospital will provide LOG for placement. Code status  DNR Con't CIR PT, OT and SLP- still looking for SNF bed- mother said she cannot manage pt at home  Have discussed prognosis with mom, will discuss with patient  [place] pass ordered  2.  Impaired mobility: d/c lovenox   since ambulating >150 feet, SCDs ordered  3. Knee pain: Provided list of foods for pain. Tylenol  as needed, continue voltaren  gel- changed to prn. XR reviewed and is stable, discussed with therapy -Right wrist swelling: XR ordered and shows no acute fracture, shows 2.73mm ulnar variance, brace removed to given patient a break from it. Voltaren  gel ordered prn for discomfort, continue, CT ordered and show no acute osseus injury, continue ace wrap   -Foot pain: tramadol  ordered prn, improved today, continue prn  -Headaches:continue tylenol  and tramadol  prn -Low back pain: continue kpad, asked [place]  to please bring for her -Whole body hypersensitivity: gabapentin  100mg  TID ordered -Pain in left heel: XR obtained and negative, cam boot ordered to help offload heel with therapy, discussed that this has helped -R shoulder pain: XR neg for acute etiology on 04/06/23, voltaren  gel ordered--encourage ROM as tolerated 12/31- denying any pain except dysuria 4. History of anxiety: discussed with her mom that she feels this was a big risk factor for her accident, propanolol d/ced due to hypotension. Continue magnesium  supplement. Asked SW to place her on neuropsych list, Buspar  ordered prn, discussed placing outpatient referral for behavioral health f/u  5. Neuropsych/cognition: This patient is not capable of making decisions on her own behalf  - 10/12: Still not capable of medical decision making; BUT oriented and communicating, following directions - MUCH improved - PRN melatonin 3mg    for insomnia  12/31-1/1 took Buspar  prn last night 6. Skin: -Buttock/sacrum--foam dressing, pressure relief, nutrition -stage 3 left heel, continue foam dressing/ pressure relief  7. Fluids/Electrolytes/Nutrition: Routine in and outs with follow-up chemistries  -Eating  well with cueing   -Dysphagia: continue D3/thins--tolerating this diet -Low protein stores: d/c prosource since patient hates these and is eating 100% of  meals -Suboptimal vitamin D  level: increase to 5,000 U daily. continue this dose  -History of magnesium  deficiency: magnesium  500mg  stopped to support BP  -12/17 good po intake 8.  Cavitary right lower lobe pneumonia likely aspiration pneumonia/MRSA pneumonia.  Resolved    Latest Ref Rng & Units 05/16/2023    6:28 AM 05/02/2023    5:20 AM 04/18/2023    5:16 AM  CBC  WBC 4.0 - 10.5 K/uL 6.2  5.9  6.0   Hemoglobin 12.0 - 15.0 g/dL 85.0  85.1  85.9   Hematocrit 36.0 - 46.0 % 42.1  43.0  41.6   Platelets 150 - 400 K/uL 300  241  194      9.  History of drug abuse.  Positive cocaine on urine drug screen.  Provide counseling  10.  AKI/hypovolemia and ATN.  Resolved     Latest Ref Rng & Units 05/16/2023    6:28 AM 05/02/2023    5:20 AM 04/18/2023    5:16 AM  BMP  Glucose 70 - 99 mg/dL 87  89  79   BUN 6 - 20 mg/dL 15  14  17    Creatinine 0.44 - 1.00 mg/dL 9.24  9.20  9.32   Sodium 135 - 145 mmol/L 136  138  137   Potassium 3.5 - 5.1 mmol/L 3.9  4.0  3.8   Chloride 98 - 111 mmol/L 105  109  105   CO2 22 - 32 mmol/L 22  23  23    Calcium  8.9 - 10.3 mg/dL 8.9  8.9  9.0     11.  Mild transaminitis with rhabdomyolysis.  Both resolved  12.  Hypotension:  D/c amantadine  -12/28-29/24 BPs mostly stable, monitor 12/31-1/1 BP soft, but per therapy, not orthostatic Vitals:   05/14/23 1922 05/14/23 2025 05/14/23 2123 05/14/23 2223  BP: 104/76 (!) 94/59 106/69 107/73   05/14/23 2322 05/15/23 0053 05/15/23 0256 05/15/23 0504  BP: 104/67 98/69 119/81 102/75   05/15/23 0511 05/15/23 1421 05/15/23 2017 05/16/23 0522  BP: 103/70 112/82 90/64 108/74    13. HTN: resolved   14. Impaired initiation: resolved, d/c amantadine   15. Hyperglycemia: CBGs mildly elevated, d/ced checks. Prosource d/ced as per patient's request  16. Spasticity:  Continue wrist, elbow brace, and PRAFOs. Ordered a night splint for LLE  D/c baclofen  and tizanidine - much improved  17.  Dyskinetic movements in head and  neck - appears to be myoclonic jerks related to hypoxic brain injury - as discussed with pt mom, no specific meds for this on antispasticity meds , consioder benzo although with pt's SUD would avoid this class - EEG reviewed and was negative for seizure.  -  resolved   18. Cervical extensor weakness: decrease tizanidine  to 2mg  HS, decrease baclofen  to 5mg  QHS, improved, d/c'd  19. Bowel and bladder incontinence: continue bowel and bladder program. D/c miralax , decrease tizanidine  to 2mg  HS, d/c'd  12/30- LBM 2 days ago- was large- co abd pain- will get KUB- if full of stool- will need to be cleaned out.   12/31- KUB looked OK- abd pain resolved- and LBM yesterday- medium 20. Fatigue: resolved, discussed with team may be secondary to anti-spasticity medications, B12 reviewed and is 335, in suboptimal range, will give 1,000U B12 injection 8/7, will order monthly as needed, metanx started, increase to BID, continue, d/c tizanidine , discussed with patient, d/c baclofen    21. MRSA nares positive: negative on repeat, precautions d/ced  22. Tachycardic: resolved, d/c propanolol. Continue tizanidine  2mg  HS. Resolved, amantadine  increased back to 100mg  BID, continue this dose, resolved  23. S/p fall: CT head reviewed and is stable  24. Bilateral foot drop: bilateral AFOs ordered, discussed that these can help her to walk better  25. Vaginal itching: completed fluconazole . Having some discharge now. See below  26. Dysuria ->100k E Coli UTI: 11/28 sensitive to amoxil , completed 1 week course -05/12/23 having dysuria and urinary hesitancy, recheck U/A today, asked nursing to please help her get changed when wet.  -05/13/23 U/A fairly unremarkable, but pt with persisting UTI type symptoms; will proceed with presumptive tx with Bactrim  DS BID x3 days, if still unresolved then may need to further evaluate this issue; timed toileting apparently not helping much with incontinence, per nursing pt stayed on  toilet for without urination, and then went  on to wet brief immediately after. Hopefully tx for UTI will help.   12/30- added Pyridium  100 mg TID x 2 days- to help dysuria. 12/31- no change in Dysuria- per nursing, has had since before Xmas- might be tear in mons/vaginal mucosa that's stinging when voids-   1/1- pt reports Burning gone is good- is done with ABX and pyridium - pt hates pyridium - freaks her out with color change.  27. Poor sitting balance: continue OT/PT, improving  28. Headaches: topamax  90m daily prn ordered, continue  29. Drowsiness with tramadol : d/ced  30. IUD in place? Pt states she has IUD x22yrs  and believes it's supposed to be removed soon(chart review- Dr. Lavoie placed Mirena  IUD on 07/01/19, good for 4yrs); needs outpatient follow-up with OB/GYN  31. Constipation: d/c baclofen , last BM 12/18, milk of magnesia ordered 12/20, LBM 12/28, continue magnesium  gluconate  12/30- LBM 12/28- but c/o abd pain- get KUB to see if needs to give Sorbitol .  32. History of drug addiction: tramadol  d/ced as per mom's preference  33. Depression: discussed scheduling for outpatient psychology/psychiatry follow-up, discussed neuropsych f/u while in hospital  34. Sinusitis: claritin  ordered prn, continue       LOS: 179 days A FACE TO FACE EVALUATION WAS PERFORMED  Philo Kurtz 05/16/2023, 8:33 AM

## 2023-05-16 NOTE — Progress Notes (Signed)
 Occupational Therapy Session Note  Patient Details  Name: Nicole [name] MRN: [id number] Date of Birth: [birth year]  Today's Date: 05/16/2023 OT Individual Time: 8694-8651 OT Individual Time Calculation (min): 43 min    Short Term Goals: Week 4:  OT Short Term Goal 1 (Week 4): Patient will motor plan to transition from supine to sitting from flat surface with min assist OT Short Term Goal 1 - Progress (Week 4): Other (comment) OT Short Term Goal 2 (Week 4): Patient will OT Short Term Goal 2 - Progress (Week 4): Other (comment) OT Short Term Goal 3 (Week 4): Patient will don left sock with min assist OT Short Term Goal 3 - Progress (Week 4): Other (comment) OT Short Term Goal 4 (Week 4): Patient will trial wearing underwear during weekday x 2 OT Short Term Goal 4 - Progress (Week 4): Other (comment) OT Short Term Goal 5 (Week 4): Patient will grab hair to place in ponytail with BUE OT Short Term Goal 5 - Progress (Week 4): Other (comment)  Skilled Therapeutic Interventions/Progress Updates:    Patient received seated in wheelchair, agreeable to toileting.  Had continent BM and nursing made aware.  Patient walked to therapy gym with min facilitation and mod cueing for upright posture and walker management.  Transitioned to supine on mat to address head/neck/ upper quadrant coordination.  Patient unable to sustain head on pillow- flexes head/neck/trunk off surface.  Worked on neck range in neutral position, and neck/proximal shoulder girdle range.  Provided passive range of motion to left shoulder girdle, and followed with bilateral active movement overhead with 3# weighted bar.  Transitioned to prone on elbows then quadruped to provide weight bearing through BUE, integrate left and right sides of body, and coordinate UE/LE with trunk movement.  Patient able to get from supine to prone with increased time, then prone to quadruped with contact guard assist only.  Significant improvement in functional  transitions this session.  Returned to room and to bed with alarm engaged and call bell/ personal items in reach.    Therapy Documentation Precautions:  Precautions Precautions: Fall Precaution Comments: decreased stand balance- reliant on UE, Pain in LLE limits mobility at times Required Braces or Orthoses: Other Brace Other Brace: B adjustable night splints; B elbow cosey splints; L palm protector; R mitt Restrictions Weight Bearing Restrictions Per Provider Order: No   Pain: Denies pain      Therapy/Group: Individual Therapy  Mckyle Solanki M 05/16/2023, 2:03 PM

## 2023-05-16 NOTE — Progress Notes (Signed)
 Physical Therapy Session Note  Patient Details  Name: Nicole [name] MRN: [id number] Date of Birth: [birth year]  Today's Date: 05/16/2023 PT Individual Time: 9154-9056 PT Individual Time Calculation (min): 58 min   Short Term Goals: Week 1:  PT Short Term Goal 1 (Week 1): Pt will perform supine<>sit with supervision PT Short Term Goal 1 - Progress (Week 1): Other (comment) (new goal) PT Short Term Goal 2 (Week 1): Pt will ambulate at least 149ft with no more than min A, not using an AD. PT Short Term Goal 2 - Progress (Week 1): Other (comment) (new goal) PT Short Term Goal 3 (Week 1): Pt will demonstrate decreased fall risk as noted by an improvement of at least 7 points on the [employer] Test PT Short Term Goal 3 - Progress (Week 1): Other (comment) (new goal) PT Short Term Goal 4 (Week 1): Pt will navigate 4 steps using HRs with no more than CGA  Skilled Therapeutic Interventions/Progress Updates:      Pt in bed to start. No c/o pain. Pt requesting assistance in getting dressed as she's only wearing a brief and night gown. Supine<>sitting EOB with supervision using hospital bed features. Allowed patient to ambulate in the room with her RW and self select clothes from drawers to improve functional independnce and reduce learned helplessness. CGA for ambulating in the room and while she reaches for clothes.   She completed LB Dressing with minA for threading jeans, supervision assist for donning her shirt. She brushes her teeth and combs her hair in standing with CGA for balance. RN present for morning medications. She required minA for donning B shoes.   Sit<>Stand to RW from unlocked wheelchair - safety cues needed to make sure brakes engaged before standing - pt voiced understanding. Ambulated with RW and close supervision in hallways - pt slowing down pace and adjusting speed for turns appropriately.   Worked on dynamic gait training with no UE support and no AD - minA needed for stepping  over hurdles, stepping up/down 4 platform, and ambulating across floor mat for compliant surface. She needs CGA to intermittent minA for ambulating on level surfaces without the use of an AD. However, when distracted, she can quickly lose her balance requiring up to maxA for balance recovery due to insufficient stepping strategies for balance.   Pt instructed in floor transfers without UE support. She completed x2 floor transfers with modA for safely lowering to the ground and min to modA for transitioning from supine to 4-point and modA for 4-point to 1/2 kneeling, and modA for 1/2 kneeling to standing. Pt with some mild motor apraxia and difficulty with trunk rotation during transitions.   Pt returned to her room and left sitting upright with chair pad alarm engaged, all needs within reach.    Therapy Documentation Precautions:  Precautions Precautions: Fall Precaution Comments: decreased stand balance- reliant on UE, Pain in LLE limits mobility at times Required Braces or Orthoses: Other Brace Other Brace: B adjustable night splints; B elbow cosey splints; L palm protector; R mitt Restrictions Weight Bearing Restrictions Per Provider Order: No General:      Therapy/Group: Individual Therapy  Nicole [name] 05/16/2023, 7:47 AM

## 2023-05-16 NOTE — Progress Notes (Signed)
 Sleep good. PRN buspar given at 1958 per patient's request. And PRN tylenol given at 2125. Continent & incontinent of urine. Continent BM at HS. Nicole [name]

## 2023-05-17 DIAGNOSIS — F418 Other specified anxiety disorders: Secondary | ICD-10-CM

## 2023-05-17 DIAGNOSIS — F1411 Cocaine abuse, in remission: Secondary | ICD-10-CM

## 2023-05-17 MED ORDER — TAMSULOSIN HCL 0.4 MG PO CAPS
0.4000 mg | ORAL_CAPSULE | Freq: Every day | ORAL | Status: DC
  Administered 2023-05-17 – 2023-05-22 (×6): 0.4 mg via ORAL
  Filled 2023-05-17 (×6): qty 1

## 2023-05-17 NOTE — Plan of Care (Signed)
 Slept well through the night. Incontinent at times. Denies pain. Safety maintained. Problem: Consults Goal: RH STROKE PATIENT EDUCATION Description: See Patient Education module for education specifics  Outcome: Progressing   Problem: RH BOWEL ELIMINATION Goal: RH STG MANAGE BOWEL WITH ASSISTANCE Description: STG Manage Bowel with mod I Assistance. Outcome: Progressing Goal: RH STG MANAGE BOWEL W/MEDICATION W/ASSISTANCE Description: STG Manage Bowel with Medication with mod I Assistance. Outcome: Progressing   Problem: RH BLADDER ELIMINATION Goal: RH STG MANAGE BLADDER WITH ASSISTANCE Description: STG Manage Bladder With toileting Assistance Outcome: Progressing   Problem: RH SKIN INTEGRITY Goal: RH STG MAINTAIN SKIN INTEGRITY WITH ASSISTANCE Description: STG Maintain Skin Integrity With min  Assistance. Outcome: Progressing

## 2023-05-17 NOTE — Progress Notes (Signed)
 Occupational Therapy Session Note  Patient Details  Name: Nicole [name] MRN: [id number] Date of Birth: 27-Dec-1975  Today's Date: 05/17/2023 OT Individual Time: [phone] [id number] OT Individual Time Calculation (min): 43 min    Short Term Goals: Week 2:  OT Short Term Goal 1 (Week 2): Week 21: Pt will ambulate to BR with RW with min A with min cues for safety OT Short Term Goal 1 - Progress (Week 2): Met OT Short Term Goal 2 (Week 2): Week 21 Patient will demonstrate safe sitting balance while completing familiar BADL task with min cueing- progressing OT Short Term Goal 2 - Progress (Week 2): Other (comment) OT Short Term Goal 3 (Week 2): Week 21Pt will perform a groom task standing at the sink with min A for 1 min with min cues OT Short Term Goal 3 - Progress (Week 2): Met OT Short Term Goal 4 (Week 2): WEek 21 Pt will bend down to retrieve items in drawer in prep for ADL from standing position with min A with RW OT Short Term Goal 4 - Progress (Week 2): Other (comment) OT Short Term Goal 5 (Week 2): xxx OT Short Term Goal 5 - Progress (Week 2): Other (comment)  Skilled Therapeutic Interventions/Progress Updates:  Session 1: Pt greeted supine in bed, pt agreeable to OT intervention.    Pt requested to shower d/t spilling sugar and syrup on her chest at breakfast.   Transfers/bed mobility/functional mobility: supine>sit with CGA. Sit>stand and ambulatory transfer into bathroom with RW and CGA.   ADLs:  Grooming: pt completed seated grooming tasks from EOB with set- up assist.   UB dressing: pt donned OH shirt with set- up assist from EOB LB dressing: pt donned pants from EOB with MIN A to pull pants to waist line in standing.  Footwear: donned shoes and socks with MAX A from EOB  Bathing: pt completed bathing both seated and standing with CGA. Pt does well directing level of carr and asking for help appropriately.  Transfers: ambulatory ADL transfers with RW and CGA. Pt even able to  step over obstacles of towels in bathroom with no LOB  Toileting: pt attempted to void b/b but unable void, CGA for clothing mgmt during attempted b/b void.    Ended session with pt supine in bed with all needs within reach and bed alarm activated.      Session 2:  Pt greeted supine in bed, pt agreeable to OT intervention.    Noted pts pants to be wet however brief was dry.   Transfers/bed mobility/functional mobility: pt completed supine>sit with CGA and ambulatory transfers with RW and CGA.   Therapeutic activity:  Pt completed functional ambulation task to challenge dynamic balance and reaching with pt instructed to ambulate ~ 6 ft to match playing cards to board with an emphasis on reaching with LUE and tight turns with RW. Pt did experience one LOB when turning with RW in tight area needing MIN A to correct. Noted limited AROM in L shoulder when reaching, ? Potential frozen shoulder.   Exercises: pt completed various exercises to loosen L shoulder as indicated below: Supine exercises: x10 horizontal shoulder ABD/ ADD   X10 chest presses with unweigted bar   X10 shoulder flexion to 90* only with unweighted bar  Pt seated in straight chair with pt instructed to roll ball forward<>backwards with an emphasis on active assist shoulder flexion  Ended session with pt seated in straight chair with all needs within reach and safety belt alarm activated.                                  Therapy Documentation Precautions:  Precautions Precautions: Fall Precaution Comments: decreased stand balance- reliant on UE, Pain in LLE limits mobility at times Required Braces or Orthoses: Other Brace Other Brace: B adjustable night splints; B elbow cosey splints; L palm protector; R mitt Restrictions Weight Bearing Restrictions Per Provider Order: No  Pain: No pain reported during either session    Therapy/Group: Individual Therapy  [facility] 05/17/2023, 12:05 PM

## 2023-05-17 NOTE — Plan of Care (Signed)
  Problem: Consults Goal: RH STROKE PATIENT EDUCATION Description: See Patient Education module for education specifics  Outcome: Progressing   Problem: RH BOWEL ELIMINATION Goal: RH STG MANAGE BOWEL WITH ASSISTANCE Description: STG Manage Bowel with mod I Assistance. Outcome: Progressing Goal: RH STG MANAGE BOWEL W/MEDICATION W/ASSISTANCE Description: STG Manage Bowel with Medication with mod I Assistance. Outcome: Progressing   Problem: RH BLADDER ELIMINATION Goal: RH STG MANAGE BLADDER WITH ASSISTANCE Description: STG Manage Bladder With toileting Assistance Outcome: Progressing   Problem: RH SKIN INTEGRITY Goal: RH STG MAINTAIN SKIN INTEGRITY WITH ASSISTANCE Description: STG Maintain Skin Integrity With min  Assistance. Outcome: Progressing

## 2023-05-17 NOTE — Consult Note (Signed)
 Neuropsychological Consultation Comprehensive Inpatient Rehab   Patient:   Nicole [name]   DOB:   October 13, 1975  MR Number:  [id number]  Location:  [facility] [address] CENTER [address] [place] [address] Dept: [phone] Loc: [phone]           Date of Service:   05/16/2023  Start Time:   2 PM End Time:   3 PM  Provider/Observer:  Norleen Asa, Psy.D.       Clinical Neuropsychologist       Billing Code/Service: [id number]  Reason for Service:    Nicole [name] is a 48 year old female referred for neuropsychological consultation during her ongoing admission to the comprehensive inpatient rehabilitation unit.  Patient has a past history of anxiety and depression and history of cocaine abuse.  Patient's mother reports that she struggled with cocaine abuse many years ago but did not use for 20 to 25 years.  Patient's mother notes that the patient began talking about cravings for cocaine with her now ex-husband before they divorced.  After the divorce the patient began abusing cocaine again.  Patient presented on 10/31/2022 after being found down in a field outside of a condemned building.  Patient was unresponsive at arrival to ED and hypoxic on 15 L nonrebreather mask.  Patient did require intubation.  Neuroimaging was concerning for acute hypoxic/ischemic or metabolic/toxic injury and signs of toxic metabolic injury.  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  There was a small amount of bifrontal subarachnoid hemorrhage.  Patient was extubated on 11/05/2022 and once assessments were completed and she was stable medically she was admitted onto the comprehensive inpatient rehabilitation unit due to decreased functional mobility and altered mental status.  During today's visit, the patient had a rather odd affect and difficulty shifting to various topics.  The patient  was aware of her hospitalization and was oriented to being in the hospital and what had happened to her in general terms.  Patient noted long history of difficulty with cocaine but that she had been very intelligent and had completed her MBA and had maintained good jobs up until she returned to substance use.  The patient reports that she did not use any cocaine for 20 to 25 years.  Patient notes improving during her CIR work and that she was able to walk to the shower today.  There were clear issues with memory with delayed recall improving but continuing to be problematic.  Patient showing cognitive deficits consistent with both hypoxic/ischemic process as well as other neurological and cognitive changes consistent with expected injury mechanism.  Patient was oriented but perseverative her in her thinking.  During our discussion today patient noted she continues to have cravings for cocaine while on the unit although she did report no efforts at trying to obtain any type of illicit substances.  Patient verbalized a desire to have some type of drug rehab post discharge but at this point, the primary focus is on her rehab and focused recovery efforts from her significant neurological insult.  Patient appeared more anxious than depressed with ongoing cognitive deficits.  Patient was able to show self-control and made no efforts to get out of her bed independently and was aware of safety protocols utilized on the unit.  HPI for the current admission:    HPI: Nicole [name] is a 48 year old right-handed female with history of asthma as well as  anxiety/depression, drug abuse. Per chart review patient lives with her mother. 1 level home with level entry. Family is planning assist as needed. Patient reportedly independent prior to admission. Presented 10/31/2022 after being found down in a field beside a condemned building. Patient unresponsive on arrival to the ED, hypoxic on 15 L nonrebreather mask. Required intubation  for airway protection. Cranial CT scan showed abnormal symmetric edema with some bilateral globus paladi concerning for acute hypoxic/ischemic or metabolic/toxic injury. No mass effect. CT cervical spine negative. MRI of the brain symmetric signal abnormality within the globi pallidi most consistent with toxic metabolic injury. 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 Small amount of bifrontal subarachnoid hemorrhage. EEG negative for seizure. CT of the chest abdomen pelvis showed right greater than left lower lobe consolidation with numerous associated centrilobular nodules and groundglass opacities. Trace right pleural effusion. No pneumothorax. Admission chemistries unremarkable except sodium 161 chloride 124 CO2 21 BUN 79 creatinine 2.57 AST 102 ALT 457 total bilirubin 2.2, WBC 43,600, lactic acid 4.4, CK3 109, BNP 140, urine drug screen positive cocaine, blood cultures no growth to date. Neurosurgery Dr. Carollee follow-up for diffuse cerebral edema in the setting of suspected acute hypoxic brain injury no neurosurgical intervention recommended. Infectious disease Dr. Dea consulted in regards to cavitary pneumonia and initially placed on broad-spectrum antibiotics since transitioned to Zyvox .. Leukocytosis much improved latest WBC 10,900 Lovenox  added for DVT prophylaxis. Echocardiogram showed no valvular vegetation and ejection fraction of 60 to 65%. TEE not recommended at this time.. Patient was extubated 6/23. Tolerating mechanical soft diet. AKI with multiple electrolyte abnormalities most likely combination of hypovolemia and ATN placed on gentle IV fluids with latest creatinine normalized 0.64. Therapy evaluations completed due to patient decreased functional mobility and altered mental status was admitted for a comprehensive rehab program.   Medical History:   Past Medical History:  Diagnosis Date   Abnormal pap    w LEEP   Alcoholism (HCC)    Allergy     Anxiety     Asthma    Asthma    Blood in stool    Depression    Depression (emotion)    Essential hypertension 01/29/2023   Frequent headaches    Hepatitis C, chronic (HCC)    hep c tx successful   Irritable bowel syndrome    OSA on CPAP - sees Dr. Herminio in ENT 10/09/2013   Seasonal allergies    scratch test in 2006///mold issue in house         Patient Active Problem List   Diagnosis Date Noted   History of cocaine abuse (HCC) 05/17/2023   Anxiety with depression 05/17/2023   Essential hypertension 01/29/2023   Pressure injury of skin 01/29/2023   Protein-calorie malnutrition, severe 01/05/2023   Hypoxic brain injury (HCC) 11/18/2022   Cavitary pneumonia 11/05/2022   Substance use 11/05/2022   Acute respiratory failure (HCC) 10/31/2022   Nonallergic rhinitis 08/23/2020   Mild persistent asthma without complication 06/14/2020   Other allergic rhinitis 06/14/2020   Adverse reaction to food, subsequent encounter 06/14/2020   IBS (irritable bowel syndrome) 05/24/2016   Chronic venous insufficiency 05/24/2016   Migraine 05/24/2016   Pulmonary nodules 10/27/2014   Hepatitis C, chronic (HCC)    Depression 10/09/2013   OSA on CPAP - sees Dr. Herminio in ENT 10/09/2013   Allergic rhinitis 05/30/2012   Cough variant asthma 02/08/2012    Behavioral Observation/Mental Status:   Nicole [name]  presents as a 48 y.o.-year-old Right handed  Caucasian Female who appeared her stated age. her dress was Appropriate and she was Well Groomed and her manners were Appropriate, inappropriate to the situation.  her participation was indicative of Appropriate, Inattentive, and Redirectable behaviors.  There were physical disabilities noted.  she displayed an appropriate level of cooperation and motivation.    Interactions:    Active Inattentive  Attention:   abnormal and attention span appeared shorter than expected for age  Memory:   abnormal; remote memory intact, recent memory  impaired  Visuo-spatial:   not examined  Speech (Volume):  normal  Speech:   normal; normal  Thought Process:  Circumstantial and Tangential  Coherent and Loose association  Though Content:  Rumination; not suicidal and not homicidal  Orientation:   person, place, and situation  Judgment:   Poor  Planning:   Poor  Affect:    Anxious  Mood:    Dysphoric  Insight:   Fair  Intelligence:   normal  Psychiatric History:  Patient with prior history of depression/anxiety and cocaine abuse with extended period of abstinence.  History of Substance Use or Abuse:  There is a documented history of cocaine abuse confirmed by the patient.    Family Med/Psych History:  Family History  Problem Relation Age of Onset   Leukemia Father    Emphysema Mother    Colon polyps Mother    Diverticulitis Mother    Heart disease Father    Cancer Father        unsure origin; had spot on lung   Hypertension Father    Hepatitis C Father    Alcohol abuse Father    Drug abuse Father    Stroke Maternal Grandmother    Osteoporosis Maternal Grandmother    Alcohol abuse Maternal Grandfather    Liver disease Maternal Grandfather    Migraines Neg Hx    Impression/DX:   Nicole [name] is a 48 year old female referred for neuropsychological consultation during her ongoing admission to the comprehensive inpatient rehabilitation unit.  Patient has a past history of anxiety and depression and history of cocaine abuse.  Patient's mother reports that she struggled with cocaine abuse many years ago but did not use for 20 to 25 years.  Patient's mother notes that the patient began talking about cravings for cocaine with her now ex-husband before they divorced.  After the divorce the patient began abusing cocaine again.  Patient presented on 10/31/2022 after being found down in a field outside of a condemned building.  Patient was unresponsive at arrival to ED and hypoxic on 15 L nonrebreather mask.  Patient did require  intubation.  Neuroimaging was concerning for acute hypoxic/ischemic or metabolic/toxic injury and signs of toxic metabolic injury.  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  There was a small amount of bifrontal subarachnoid hemorrhage.  Patient was extubated on 11/05/2022 and once assessments were completed and she was stable medically she was admitted onto the comprehensive inpatient rehabilitation unit due to decreased functional mobility and altered mental status.  During today's visit, the patient had a rather odd affect and difficulty shifting to various topics.  The patient was aware of her hospitalization and was oriented to being in the hospital and what had happened to her in general terms.  Patient noted long history of difficulty with cocaine but that she had been very intelligent and had completed her MBA and had maintained good jobs up until she returned to substance use.  The patient reports that she did not use any cocaine for 20 to 25 years.  Patient notes improving during her CIR work and that she was able to walk to the shower today.  There were clear issues with memory with delayed recall improving but continuing to be problematic.  Patient showing cognitive deficits consistent with both hypoxic/ischemic process as well as other neurological and cognitive changes consistent with expected injury mechanism.  Patient was oriented but perseverative her in her thinking.  During our discussion today patient noted she continues to have cravings for cocaine while on the unit although she did report no efforts at trying to obtain any type of illicit substances.  Patient verbalized a desire to have some type of drug rehab post discharge but at this point, the primary focus is on her rehab and focused recovery efforts from her significant neurological insult.  Patient appeared more anxious than depressed with ongoing cognitive  deficits.  Patient was able to show self-control and made no efforts to get out of her bed independently and was aware of safety protocols utilized on the unit.  Disposition/Plan:  Today we worked on coping and adjustment issues with extended hospital stay and significant residual cognitive and neurobehavioral changes post hypoxic and cerebrovascular insult.          Electronically Signed   _______________________ Norleen Asa, Psy.D. Clinical Neuropsychologist

## 2023-05-17 NOTE — Progress Notes (Signed)
 Speech Language Pathology Daily Session Note  Patient Details  Name: Nicole [name] MRN: [id number] Date of Birth: 18-Jul-1975  Today's Date: 05/17/2023 SLP Individual Time: 0901-1000 SLP Individual Time Calculation (min): 59 min  Short Term Goals: Week 9: SLP Short Term Goal 1 (Week 9): Week 25 - Pt will solve mildly complex environmental problems with 60% accuracy given min assist. SLP Short Term Goal 2 (Week 9): Week 25 - Pt will recall basic daily information using external aids with 90% accuracy given mod assist. SLP Short Term Goal 3 (Week 9): Week 25 - Pt will utilize recommended handwriting strategies to achieve 90% legebility given mod verbal cues in 4/5 opportunities at the phrase and sentence levels. SLP Short Term Goal 4 (Week 9): Week 25 - Pt will utilize increased vocal intensity w/ min verbal cues to remain 90% intelligibile at conversation level in noisy environments in 4/5 opportunities.  Skilled Therapeutic Interventions: Skilled therapy session focused on cognitive goals. SLP faciliated session by prompting patient to recall activities completed in ST session and main ideas of stories read yesterday. Patient independently recalled each story completed yesterday in detail indicating functional improvements in memory skills. SLP continued to address cognition and writing skills through providing minA for patient to write, then type goals for 2025. Patient initially writing goals including finishing MBA, but SLP discussed importance of healing journey and encouraged first working on reading, processing, and use of computer. Patient in agreeance. At the end of the sesison, patient incontinent of bladder and pericare completed by NT/SLP. Patient left in bed with alarm set and call bell in reach. Continue POC.    Pain None reported   Therapy/Group: Individual Therapy  Pennye Beeghly M.A., CF-SLP 05/17/2023, 7:35 AM

## 2023-05-17 NOTE — Progress Notes (Signed)
 Physical Therapy Session Note  Patient Details  Name: Nicole [name] MRN: [id number] Date of Birth: Aug 17, 1975  Today's Date: 05/17/2023 PT Individual Time: 0820-0900 + 1300-1355 PT Individual Time Calculation (min): 40 min  + 55 min  Short Term Goals: Week 1:  PT Short Term Goal 1 (Week 1): Pt will perform supine<>sit with supervision PT Short Term Goal 1 - Progress (Week 1): Other (comment) (new goal) PT Short Term Goal 2 (Week 1): Pt will ambulate at least 175ft with no more than min A, not using an AD. PT Short Term Goal 2 - Progress (Week 1): Other (comment) (new goal) PT Short Term Goal 3 (Week 1): Pt will demonstrate decreased fall risk as noted by an improvement of at least 7 points on the [employer] Test PT Short Term Goal 3 - Progress (Week 1): Other (comment) (new goal) PT Short Term Goal 4 (Week 1): Pt will navigate 4 steps using HRs with no more than CGA  Skilled Therapeutic Interventions/Progress Updates:      1st treatment: Direct handoff of care from OT to start. Patient agreeable to therapy - fully dressed and has no c/o pain.  Supine<>Sitting EOB with supervision using hospital bed features. Sit<>Stand to RW with CGA and ambulates with SBA and RW from her room to day room gym, ~140ft. Assisted on mat table to work on core strengthening and stretching. Hip rotator stretching, figure-4 and piriformis stretching, and hip adductors. Pt with improved tolerance and increased ROM bilaterally.  Core strengthening included: -4x20 second forearm/knee planks -1x15 dead bugs -1x12 bilateral SLR -1x10 bilateral scissor kicks -1x10 sit up's -1x10 crunches 1x10 modified bird-dog arm lifts *PT cueing for positioning and assist for setup as needed.   Pt ambulated back to her room with SBA and RW and assisted to bed per her request. Left with alarm on, all needs met.     2nd treatment: Pt sitting in chair and in agreeable to therapy. No c/o pain.   Sit<>stand to RW with CGA  and ambulates with SBA and RW from her room to main rehab gym.   Assisted onto mat table and completed modified yoga positions (cat/cow, childs pose, long sitting lateral leans, long sitting hamstring stretching, butterfly stretching, modified cobra position on elbows).   Then completed standing modified yoga positions (tree pose with unilateral UE support, warrior pose) with minA for dynamic balance for these positions.   Ambulated back to her room with SBA and RW - ended treatment in chair with seat belt alarm on, all needs met.    Therapy Documentation Precautions:  Precautions Precautions: Fall Precaution Comments: decreased stand balance- reliant on UE, Pain in LLE limits mobility at times Required Braces or Orthoses: Other Brace Other Brace: B adjustable night splints; B elbow cosey splints; L palm protector; R mitt Restrictions Weight Bearing Restrictions Per Provider Order: No General:      Therapy/Group: Individual Therapy  Nicole [name] 05/17/2023, 7:46 AM

## 2023-05-17 NOTE — Progress Notes (Signed)
 PROGRESS NOTE   Subjective/Complaints:  Pt reports that needs help with difficulty voiding- and then incontinent- will try Flomax  to help.  Said timed voiding not helping.   Said peeing frequently- LBM yesterday  ROS:    Pt denies SOB, abd pain, CP, N/V/C/D, and vision changes   +anxiety and depression- improved, improved initiation    Objective:   No results found.   Recent Labs    05/16/23 0628  WBC 6.2  HGB 14.9  HCT 42.1  PLT 300          Recent Labs    05/16/23 0628  NA 136  K 3.9  CL 105  CO2 22  GLUCOSE 87  BUN 15  CREATININE 0.75  CALCIUM  8.9           Intake/Output Summary (Last 24 hours) at 05/17/2023 0908 Last data filed at 05/17/2023 9276 Gross per 24 hour  Intake 800 ml  Output --  Net 800 ml           Physical Exam: Vital Signs Blood pressure 95/75, pulse 81, temperature (!) 97.5 F (36.4 C), resp. rate 17, height 5' 1 (1.549 m), weight 50.5 kg, SpO2 97%.        General: awake, alert, appropriate, NAD HENT: conjugate gaze; oropharynx moist CV: regular rate and rhythm; no JVD Pulmonary: CTA B/L; no W/R/R- good air movement GI: soft, NT, ND, (+)BS Psychiatric: appropriate- flat, facies, but interactive, asking questions Neurological: poor memory, but alert  PRIOR EXAMS: Skin: + Scaling dried skin on bilateral heels left buttock unstageable - not examined + Left heel stage III DTI has resolved Neuro: memory seeming to improve lately Neurologic: AAOx2, fair insight and awareness..  + Hypophonation, speech intelligability much improved. Fair insight. MAS 1 right elbow,MAS  tr to 1 in RIght finger and wrist flexors  MAS 1 in extensors of LE's MAS 1 L elbow , MAS 1+ in L wrist and finger flexors. Hypersensitivity to left hand improved, Left sided strength improved, decreased range of motion and pain at end range of motion in left  shoulder  Musculoskeletal: limited bilateral shoulder and elbow ROM due to increased tone ,right shoulder pain with PROM--stable 12/26     Assessment/Plan: 1. Functional deficits which require 3+ hours per day of interdisciplinary therapy in a comprehensive inpatient rehab setting. Physiatrist is providing close team supervision and 24 hour management of active medical problems listed below. Physiatrist and rehab team continue to assess barriers to discharge/monitor patient progress toward functional and medical goals  Care Tool:  Bathing    Body parts bathed by patient: Face, Abdomen, Chest, Left upper leg, Right upper leg, Left arm, Front perineal area, Right arm, Right lower leg, Left lower leg, Buttocks   Body parts bathed by helper: Buttocks Body parts n/a: Right arm, Left arm, Front perineal area, Buttocks, Right upper leg, Left upper leg, Right lower leg, Left lower leg   Bathing assist Assist Level: Contact Guard/Touching assist     Upper Body Dressing/Undressing Upper body dressing   What is the patient wearing?: Pull over shirt    Upper body assist Assist Level: Set up assist    Lower Body  Dressing/Undressing Lower body dressing      What is the patient wearing?: Incontinence brief, Pants     Lower body assist Assist for lower body dressing: Minimal Assistance - Patient > 75%     Toileting Toileting Toileting Activity did not occur (Clothing management and hygiene only): N/A (no void or bm)  Toileting assist Assist for toileting: Minimal Assistance - Patient > 75%     Transfers Chair/bed transfer  Transfers assist  Chair/bed transfer activity did not occur: Safety/medical concerns  Chair/bed transfer assist level: Contact Guard/Touching assist Chair/bed transfer assistive device: Walker, Armrests   Locomotion Ambulation   Ambulation assist   Ambulation activity did not occur: Safety/medical concerns  Assist level: Contact Guard/Touching  assist Assistive device: Walker-rolling Max distance: 150   Walk 10 feet activity   Assist  Walk 10 feet activity did not occur: Safety/medical concerns  Assist level: Contact Guard/Touching assist Assistive device: Walker-rolling   Walk 50 feet activity   Assist Walk 50 feet with 2 turns activity did not occur: Safety/medical concerns  Assist level: Contact Guard/Touching assist Assistive device: Walker-rolling    Walk 150 feet activity   Assist Walk 150 feet activity did not occur: Safety/medical concerns  Assist level: Contact Guard/Touching assist Assistive device: Walker-rolling    Walk 10 feet on uneven surface  activity   Assist Walk 10 feet on uneven surfaces activity did not occur: Safety/medical concerns   Assist level: Minimal Assistance - Patient > 75% Assistive device: Walker-rolling   Wheelchair     Assist Is the patient using a wheelchair?: No Type of Wheelchair: Manual    Wheelchair assist level: Moderate Assistance - Patient 50 - 74% Max wheelchair distance: 11ft    Wheelchair 50 feet with 2 turns activity    Assist    Wheelchair 50 feet with 2 turns activity did not occur: Safety/medical concerns   Assist Level: Supervision/Verbal cueing   Wheelchair 150 feet activity     Assist  Wheelchair 150 feet activity did not occur: Safety/medical concerns   Assist Level: Moderate Assistance - Patient 50 - 74%   Blood pressure 95/75, pulse 81, temperature (!) 97.5 F (36.4 C), resp. rate 17, height 5' 1 (1.549 m), weight 50.5 kg, SpO2 97%.  Medical Problem List and Plan: 1. 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 Awaiting approval of disability. Placement pending. Hoping that hospital will provide LOG for placement. Code status  DNR Con't CIR PT and and SLP still looking for SNF bed- mother said she cannot manage pt at home  Have discussed prognosis with mom, will discuss with patient  [place] pass ordered  2.  Impaired mobility: d/c lovenox  since ambulating >150 feet, SCDs ordered  3. Knee pain: Provided list of foods for pain. Tylenol  as needed, continue voltaren  gel- changed to prn. XR reviewed and is stable, discussed with therapy -Right wrist swelling: XR ordered and shows no acute fracture, shows 2.61mm ulnar variance, brace removed to given patient a break from it. Voltaren  gel ordered prn for discomfort, continue, CT ordered and show no acute osseus injury, continue ace wrap   -Foot pain: tramadol  ordered prn, improved today, continue prn  -  Headaches:continue tylenol  and tramadol  prn -Low back pain: continue kpad, asked [place]  to please bring for her -Whole body hypersensitivity: gabapentin  100mg  TID ordered -Pain in left heel: XR obtained and negative, cam boot ordered to help offload heel with therapy, discussed that this has helped -R shoulder pain: XR neg for acute etiology on 04/06/23, voltaren  gel ordered--encourage ROM as tolerated 12/31- denying any pain except dysuria 4. History of anxiety: discussed with her mom that she feels this was a big risk factor for her accident, propanolol d/ced due to hypotension. Continue magnesium  supplement. Asked SW to place her on neuropsych list, Buspar  ordered prn, discussed placing outpatient referral for behavioral health f/u  5. Neuropsych/cognition: This patient is not capable of making decisions on her own behalf  - 10/12: Still not capable of medical decision making; BUT oriented and communicating, following directions - MUCH improved - PRN melatonin 3mg    for insomnia  12/31-1/1 took Buspar  prn last night 6.  Skin: -Buttock/sacrum--foam dressing, pressure relief, nutrition -stage 3 left heel, continue foam dressing/ pressure relief  7. Fluids/Electrolytes/Nutrition: Routine in and outs with follow-up chemistries  -Eating  well with cueing   -Dysphagia: continue D3/thins--tolerating this diet -Low protein stores: d/c prosource since patient hates these and is eating 100% of meals -Suboptimal vitamin D  level: increase to 5,000 U daily. continue this dose  -History of magnesium  deficiency: magnesium  500mg  stopped to support BP  -12/17 good po intake 8.  Cavitary right lower lobe pneumonia likely aspiration pneumonia/MRSA pneumonia.  Resolved    Latest Ref Rng & Units 05/16/2023    6:28 AM 05/02/2023    5:20 AM 04/18/2023    5:16 AM  CBC  WBC 4.0 - 10.5 K/uL 6.2  5.9  6.0   Hemoglobin 12.0 - 15.0 g/dL 85.0  85.1  85.9   Hematocrit 36.0 - 46.0 % 42.1  43.0  41.6   Platelets 150 - 400 K/uL 300  241  194      9.  History of drug abuse.  Positive cocaine on urine drug screen.  Provide counseling  10.  AKI/hypovolemia and ATN.  Resolved     Latest Ref Rng & Units 05/16/2023    6:28 AM 05/02/2023    5:20 AM 04/18/2023    5:16 AM  BMP  Glucose 70 - 99 mg/dL 87  89  79   BUN 6 - 20 mg/dL 15  14  17    Creatinine 0.44 - 1.00 mg/dL 9.24  9.20  9.32   Sodium 135 - 145 mmol/L 136  138  137   Potassium 3.5 - 5.1 mmol/L 3.9  4.0  3.8   Chloride 98 - 111 mmol/L 105  109  105   CO2 22 - 32 mmol/L 22  23  23    Calcium  8.9 - 10.3 mg/dL 8.9  8.9  9.0     11.  Mild transaminitis with rhabdomyolysis.  Both resolved  12.  Hypotension:  D/c amantadine  -12/28-29/24 BPs mostly stable, monitor 12/31-1/1 BP soft, but per therapy, not orthostatic 1/2- BP soft- 90s/50s- but not orthostatic Sx's per staff Vitals:   05/14/23 2223 05/14/23 2322 05/15/23 0053 05/15/23 0256  BP: 107/73 104/67 98/69 119/81   05/15/23 0504 05/15/23 0511 05/15/23 1421 05/15/23 2017  BP: 102/75 103/70 112/82 90/64   05/16/23 0522  05/16/23 1300 05/16/23 1921 05/17/23 0321  BP: 108/74 111/75 105/65 95/75    13. HTN: resolved   14. Impaired initiation: resolved, d/c amantadine   15. Hyperglycemia: CBGs mildly  elevated, d/ced checks. Prosource d/ced as per patient's request  16. Spasticity:  Continue wrist, elbow brace, and PRAFOs. Ordered a night splint for LLE  D/c baclofen  and tizanidine - much improved  17.  Dyskinetic movements in head and neck - appears to be myoclonic jerks related to hypoxic brain injury - as discussed with pt mom, no specific meds for this on antispasticity meds , consioder benzo although with pt's SUD would avoid this class - EEG reviewed and was negative for seizure.  -  resolved   18. Cervical extensor weakness: decrease tizanidine  to 2mg  HS, decrease baclofen  to 5mg  QHS, improved, d/c'd  19. Bowel and bladder incontinence: continue bowel and bladder program. D/c miralax , decrease tizanidine  to 2mg  HS, d/c'd  12/30- LBM 2 days ago- was large- co abd pain- will get KUB- if full of stool- will need to be cleaned out.   12/31- KUB looked OK- abd pain resolved- and LBM yesterday- medium  1/2- Pt will sit on toilet for 20-30 minutes, and not go, but then be incontinent in brief- will try Low dose Flomax  0.4 mg at bedtime- and if makes her dizzy/lightheaded- will have to stop- but sounds like she cannot void when wants to- timed voiding not working a s a result.  20. Fatigue: resolved, discussed with team may be secondary to anti-spasticity medications, B12 reviewed and is 335, in suboptimal range, will give 1,000U B12 injection 8/7, will order monthly as needed, metanx started, increase to BID, continue, d/c tizanidine , discussed with patient, d/c baclofen    21. MRSA nares positive: negative on repeat, precautions d/ced  22. Tachycardic: resolved, d/c propanolol. Continue tizanidine  2mg  HS. Resolved, amantadine  increased back to 100mg  BID, continue this dose, resolved  23. S/p fall: CT head  reviewed and is stable  24. Bilateral foot drop: bilateral AFOs ordered, discussed that these can help her to walk better  25. Vaginal itching: completed fluconazole . Having some discharge now. See below  26. Dysuria ->100k E Coli UTI: 11/28 sensitive to amoxil , completed 1 week course -05/12/23 having dysuria and urinary hesitancy, recheck U/A today, asked nursing to please help her get changed when wet.  -05/13/23 U/A fairly unremarkable, but pt with persisting UTI type symptoms; will proceed with presumptive tx with Bactrim  DS BID x3 days, if still unresolved then may need to further evaluate this issue; timed toileting apparently not helping much with incontinence, per nursing pt stayed on toilet for without urination, and then went on to wet brief immediately after. Hopefully tx for UTI will help.   12/30- added Pyridium  100 mg TID x 2 days- to help dysuria. 12/31- no change in Dysuria- per nursing, has had since before Xmas- might be tear in mons/vaginal mucosa that's stinging when voids-   1/1- pt reports Burning gone is good- is done with ABX and pyridium - pt hates pyridium - freaks her out with color change.  27. Poor sitting balance: continue OT/PT, improving  28. Headaches: topamax  60m daily prn ordered, continue  29. Drowsiness with tramadol : d/ced  30. IUD in place? Pt states she has IUD x72yrs  and believes it's supposed to be removed soon(chart review- Dr. Lavoie placed Mirena  IUD on 07/01/19, good for 97yrs); needs outpatient follow-up with OB/GYN  31. Constipation: d/c baclofen , last BM 12/18, milk of magnesia ordered 12/20, LBM 12/28, continue magnesium  gluconate  12/30- LBM 12/28- but c/o abd pain- get KUB to see if needs to give Sorbitol .  32. History of drug addiction: tramadol  d/ced as per mom's  preference  33. Depression: discussed scheduling for outpatient psychology/psychiatry follow-up, discussed neuropsych f/u while in hospital  34. Sinusitis: claritin   ordered prn, continue    I spent a total of  35  minutes on total care today- >50% coordination of care- due to  D/w team and pt about bladder incontinence- I think she's retaining and so needs A to help her void.     LOS: 180 days A FACE TO FACE EVALUATION WAS PERFORMED  Michelyn Scullin 05/17/2023, 9:08 AM

## 2023-05-18 DIAGNOSIS — F418 Other specified anxiety disorders: Secondary | ICD-10-CM

## 2023-05-18 DIAGNOSIS — G903 Multi-system degeneration of the autonomic nervous system: Secondary | ICD-10-CM

## 2023-05-18 DIAGNOSIS — N319 Neuromuscular dysfunction of bladder, unspecified: Secondary | ICD-10-CM

## 2023-05-18 NOTE — Progress Notes (Signed)
 Physical Therapy Weekly Progress Note  Patient Details  Name: Nicole [name] MRN: [id number] Date of Birth: August 15, 1975  Beginning of progress report period: May 11, 2023 End of progress report period: May 18, 2023  Today's Date: 05/18/2023 PT Individual Time: 9054-8941 PT Individual Time Calculation (min): 73 min   [name] continues to make significant functional gains - she currently requires minA for bed mobility, CGA for stand pivot transfers using the RW, and CGA to SBA for ambulating 147ft using the RW. She recently had a unwitnessed fall in her room while reaching for an object on the ground (no injuries) but now has a telesitter present. She does exhibit some mild impulsivity and can be quick to stand from unlocked wheelchair or unfavorable environment.   Patient continues to demonstrate the following deficits muscle weakness and muscle joint tightness, decreased cardiorespiratoy endurance, abnormal tone, unbalanced muscle activation, and motor apraxia, decreased motor planning, decreased attention, decreased awareness, decreased problem solving, decreased safety awareness, and decreased memory, and decreased sitting balance, decreased standing balance, decreased postural control, and decreased balance strategies and therefore will continue to benefit from skilled PT intervention to increase functional independence with mobility.  Patient progressing toward long term goals..  Continue plan of care.  PT Short Term Goals Week 1:  PT Short Term Goal 1 (Week 1): Pt will perform supine<>sit with supervision PT Short Term Goal 1 - Progress (Week 1): Other (comment) (new goal) PT Short Term Goal 2 (Week 1): Pt will ambulate at least 176ft with no more than min A, not using an AD. PT Short Term Goal 2 - Progress (Week 1): Other (comment) (new goal) PT Short Term Goal 3 (Week 1): Pt will demonstrate decreased fall risk as noted by an improvement of at least 7 points on the [employer]  Test PT Short Term Goal 3 - Progress (Week 1): Other (comment) (new goal) PT Short Term Goal 4 (Week 1): Pt will navigate 4 steps using HRs with no more than CGA  Skilled Therapeutic Interventions/Progress Updates:      Pt sitting up in wheelchair to start - agreeable to PT tx and has no c/o pain. Dressed and ready for therapy.   Sit<>stand to RW with CGA., Ambulated 170ft with SBA and RW to main rehab gym - cues needed for safety awareness due to wet floors from EVS.   Pt instructed in dynamic standing therapeutic activity using MaxiSky harness system to allow hands off balance training. Pt participated in various activities - lacrosse toss, badminton pass, and even hitting a slow pitched ball with baseball bat. Pt with frequent minor LOB that relied on harness system for balance recovery. This did allow her to work on her stepping strategies for LOB but these were mostly insufficient.   Then worked on stepping strategies and ankle/hip strategies while standing in unsupported position in MaxiSkky harness - perturbation in random directions applied to upper trunk, back, and shoulders to encourage LOB and facilitate stepping for balance recovery. Most difficulty with A/P LOB more than laterally.   Ambulated back to her room with SBA and RW - left sitting upright in wheelchair with all needs met, chair pad alarm on and telesitter in room.   Therapy Documentation Precautions:  Precautions Precautions: Fall Precaution Comments: decreased stand balance- reliant on UE, Pain in LLE limits mobility at times Required Braces or Orthoses: Other Brace Other Brace: B adjustable night splints; B elbow cosey splints; L palm protector; R mitt Restrictions Weight Bearing Restrictions  Per Provider Order: No General:      Therapy/Group: Individual Therapy  Freya Zobrist P Nasire Reali  PT, DPT, CSRS  05/18/2023, 7:48 AM

## 2023-05-18 NOTE — Plan of Care (Signed)
  Problem: Consults Goal: RH STROKE PATIENT EDUCATION Description: See Patient Education module for education specifics  Outcome: Progressing   Problem: RH BOWEL ELIMINATION Goal: RH STG MANAGE BOWEL WITH ASSISTANCE Description: STG Manage Bowel with mod I Assistance. Outcome: Progressing Goal: RH STG MANAGE BOWEL W/MEDICATION W/ASSISTANCE Description: STG Manage Bowel with Medication with mod I Assistance. Outcome: Progressing   Problem: RH BLADDER ELIMINATION Goal: RH STG MANAGE BLADDER WITH ASSISTANCE Description: STG Manage Bladder With toileting Assistance Outcome: Progressing   Problem: RH SKIN INTEGRITY Goal: RH STG MAINTAIN SKIN INTEGRITY WITH ASSISTANCE Description: STG Maintain Skin Integrity With min  Assistance. Outcome: Progressing

## 2023-05-18 NOTE — Progress Notes (Signed)
 Occupational Therapy Weekly Progress Note  Patient Details  Name: Nicole [name] MRN: [id number] Date of Birth: 28-Mar-1976  Beginning of progress report period: May 11, 2023 End of progress report period: May 18, 2023  Today's Date: 05/18/2023 OT Individual Time: 9194-9083 OT Individual Time Calculation (min): 71 min    Patient has met 3 of 4 short term goals.  Patient continues to show functional improvements with all aspects of BADL.  Patient continues to need intermittent physical assist for BADL - however is establishing routines which improve her autonomy.    Patient continues to demonstrate the following deficits: muscle joint tightness, impaired timing and sequencing, abnormal tone, unbalanced muscle activation, motor apraxia, and decreased coordination, decreased visual acuity and decreased visual perceptual skills, decreased attention to left, decreased awareness, decreased problem solving, decreased safety awareness, decreased memory, and delayed processing, and decreased sitting balance, decreased standing balance, decreased postural control, hemiplegia, and decreased balance strategies and therefore will continue to benefit from skilled OT intervention to enhance overall performance with IADL/BADL and Reduce care partner burden.  Patient progressing toward long term goals..  Continue plan of care.  OT Short Term Goals Week 5:  Week 24 OT Short Term Goal 1 (Week 5): Patient will grab hair to place in ponytail with BUE OT Short Term Goal 1 - Progress (Week 5): Other (comment) OT Short Term Goal 2 (Week 5): Patient will trial wearing underwear during weekday x 2 OT Short Term Goal 2 - Progress (Week 5): Other (comment) OT Short Term Goal 3 (Week 5): Patient will complete laundry with min assist. OT Short Term Goal 3 - Progress (Week 5): Other (comment) OT Short Term Goal 4 (Week 5): Patient will prepare a simple snack while walking in the kitchen OT Short Term Goal 4 -  Progress (Week 5): Other (comment) OT Short Term Goal 5 (Week 5): xxx OT Short Term Goal 5 - Progress (Week 5): Other (comment)  Skilled Therapeutic Interventions/Progress Updates:    Patient received supine in bed indicating need to void.  Patient sat at edge of bed and put on socks and shoes with min assist then walked to bathroom.  Patient unable to void.  Patient indicates that she soaked her clothing after 2 hours of wearing underwear on Wednesday.  Patient indicates that she had little awareness of need to void.  Patient will return to wearing a brief.  Patient pleased with her ability to problem solve bathing her own body and dressing herself with intermittent min assist.  Shared with patient that it was time to write her weekly progress note - and when asked what goals she needed to work on - patient indicates she needs to return to [facility] studies.  When redirected that taking care of herself and getting out of the hospital may be more pressing immediately, patient expresses verbal understanding - however her awareness / insight remains significantly impaired.  Patient is able to identify improved ability to care for herself, movement in shoulders, knees, and with walking.   Patient needing gentle coaxing to figure out and problem solve problems that occur with bathing and dressing and use of left UE/LE.     Therapy Documentation Precautions:  Precautions Precautions: Fall Precaution Comments: decreased stand balance- reliant on UE, Pain in LLE limits mobility at times Required Braces or Orthoses: Other Brace Other Brace: B adjustable night splints; B elbow cosey splints; L palm protector; R mitt Restrictions Weight Bearing Restrictions Per Provider Order: No   Pain: Pain  Assessment Pain Scale: 0-10 Pain Score: 0-No pain      Therapy/Group: Individual Therapy  Johnthomas Lader M 05/18/2023, 12:44 PM

## 2023-05-18 NOTE — Progress Notes (Signed)
 Speech Language Pathology Daily Session Note  Patient Details  Name: Nicole [name] MRN: [id number] Date of Birth: 01/16/1976  Today's Date: 05/18/2023 SLP Individual Time: 1400-1500 SLP Individual Time Calculation (min): 60 min  Short Term Goals: Week 9: SLP Short Term Goal 1 (Week 9): Week 25 - Pt will solve mildly complex environmental problems with 60% accuracy given min assist. SLP Short Term Goal 2 (Week 9): Week 25 - Pt will recall basic daily information using external aids with 90% accuracy given mod assist. SLP Short Term Goal 3 (Week 9): Week 25 - Pt will utilize recommended handwriting strategies to achieve 90% legebility given mod verbal cues in 4/5 opportunities at the phrase and sentence levels. SLP Short Term Goal 4 (Week 9): Week 25 - Pt will utilize increased vocal intensity w/ min verbal cues to remain 90% intelligibile at conversation level in noisy environments in 4/5 opportunities.  Skilled Therapeutic Interventions: Skilled therapy session focused on problem solving and communication goals. SLP faciliated session by providing supervision A for patient to utilize 'speak up' strategy during conversation in a noisy environment.  Using strategy, patient reached 100% intelligibility. SLP addressed problem solving goals during completion of color by number task. Patient utilized instructions to complete task and requested transfer to nurses station to sharpen pencil when needed independently. At end of session, SLP reviewed writing strategies and encouraged patient to practice through summarizing chapter of book. Patient in agreeance. Patient left in [facility] with nursing in room. Continue POC.  Pain denies  Therapy/Group: Individual Therapy  Maitland Lesiak M.A., CF-SLP 05/18/2023, 7:57 AM

## 2023-05-18 NOTE — Progress Notes (Signed)
 PROGRESS NOTE   Subjective/Complaints:  Up ambulating with therapy. Gait much improved! Voided continently yesterday without retention. Still a bit frequent  ROS: Patient denies fever, rash, sore throat, blurred vision, dizziness, nausea, vomiting, diarrhea, cough, shortness of breath or chest pain, joint or back/neck pain, headache, or mood change.     Objective:   No results found.   Recent Labs    05/16/23 0628  WBC 6.2  HGB 14.9  HCT 42.1  PLT 300          Recent Labs    05/16/23 0628  NA 136  K 3.9  CL 105  CO2 22  GLUCOSE 87  BUN 15  CREATININE 0.75  CALCIUM  8.9           Intake/Output Summary (Last 24 hours) at 05/18/2023 1035 Last data filed at 05/18/2023 0810 Gross per 24 hour  Intake 720 ml  Output --  Net 720 ml           Physical Exam: Vital Signs Blood pressure 91/61, pulse 76, temperature 98.3 F (36.8 C), resp. rate 17, height 5' 1 (1.549 m), weight 50.5 kg, SpO2 97%.        Constitutional: No distress . Vital signs reviewed. HEENT: NCAT, EOMI, oral membranes moist Neck: supple Cardiovascular: RRR without murmur. No JVD    Respiratory/Chest: CTA Bilaterally without wheezes or rales. Normal effort    GI/Abdomen: BS +, non-tender, non-distended Ext: no clubbing, cyanosis, or edema Psych: flat but cooperative  Skin: + Scaling dried skin on bilateral heels left buttock area no examined as she was up and dressed + Left heel stage III DTI has resolved Neuro: memory improving. Remembers me Neurologic: AAOx2, fair insight and awareness.[name]  Phonation and speech intelligability much improved. Fair insight. MAS 1 right elbow,MAS  tr to 1 in RIght finger and wrist flexors  MAS 1 in extensors of LE's MAS 1 L elbow , MAS 1+ in L wrist and finger flexors. Hypersensitivity to left hand improved, Left sided strength improved, decreased range of motion and pain at end range of  motion in left shoulder  Musculoskeletal:some limitations in right shoulder ROM     Assessment/Plan: 1. Functional deficits which require 3+ hours per day of interdisciplinary therapy in a comprehensive inpatient rehab setting. Physiatrist is providing close team supervision and 24 hour management of active medical problems listed below. Physiatrist and rehab team continue to assess barriers to discharge/monitor patient progress toward functional and medical goals  Care Tool:  Bathing    Body parts bathed by patient: Face, Abdomen, Chest, Left upper leg, Right upper leg, Left arm, Front perineal area, Right arm, Right lower leg, Left lower leg, Buttocks   Body parts bathed by helper: Buttocks Body parts n/a: Right arm, Left arm, Front perineal area, Buttocks, Right upper leg, Left upper leg, Right lower leg, Left lower leg   Bathing assist Assist Level: Contact Guard/Touching assist     Upper Body Dressing/Undressing Upper body dressing   What is the patient wearing?: Pull over shirt    Upper body assist Assist Level: Set up assist    Lower Body Dressing/Undressing Lower body dressing  What is the patient wearing?: Incontinence brief, Pants     Lower body assist Assist for lower body dressing: Minimal Assistance - Patient > 75%     Toileting Toileting Toileting Activity did not occur (Clothing management and hygiene only): N/A (no void or bm)  Toileting assist Assist for toileting: Minimal Assistance - Patient > 75%     Transfers Chair/bed transfer  Transfers assist  Chair/bed transfer activity did not occur: Safety/medical concerns  Chair/bed transfer assist level: Contact Guard/Touching assist Chair/bed transfer assistive device: Walker, Armrests   Locomotion Ambulation   Ambulation assist   Ambulation activity did not occur: Safety/medical concerns  Assist level: Contact Guard/Touching assist Assistive device: Walker-rolling Max distance: 150    Walk 10 feet activity   Assist  Walk 10 feet activity did not occur: Safety/medical concerns  Assist level: Contact Guard/Touching assist Assistive device: Walker-rolling   Walk 50 feet activity   Assist Walk 50 feet with 2 turns activity did not occur: Safety/medical concerns  Assist level: Contact Guard/Touching assist Assistive device: Walker-rolling    Walk 150 feet activity   Assist Walk 150 feet activity did not occur: Safety/medical concerns  Assist level: Contact Guard/Touching assist Assistive device: Walker-rolling    Walk 10 feet on uneven surface  activity   Assist Walk 10 feet on uneven surfaces activity did not occur: Safety/medical concerns   Assist level: Minimal Assistance - Patient > 75% Assistive device: Walker-rolling   Wheelchair     Assist Is the patient using a wheelchair?: No Type of Wheelchair: Manual    Wheelchair assist level: Moderate Assistance - Patient 50 - 74% Max wheelchair distance: 42ft    Wheelchair 50 feet with 2 turns activity    Assist    Wheelchair 50 feet with 2 turns activity did not occur: Safety/medical concerns   Assist Level: Supervision/Verbal cueing   Wheelchair 150 feet activity     Assist  Wheelchair 150 feet activity did not occur: Safety/medical concerns   Assist Level: Moderate Assistance - Patient 50 - 74%   Blood pressure 91/61, pulse 76, temperature 98.3 F (36.8 C), resp. rate 17, height 5' 1 (1.549 m), weight 50.5 kg, SpO2 97%.  Medical Problem List and Plan: 1. 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 Awaiting approval of disability.  Placement pending. Hoping that hospital will provide LOG for placement. Code status  DNR -Continue CIR therapies including PT, OT, and SLP  -still waiting on SNF offers  2.  Impaired mobility: d/c lovenox  since ambulating >150 feet, SCDs ordered  3. Knee pain: Provided list of foods for pain. Tylenol  as needed, continue voltaren  gel- changed to prn. XR reviewed and is stable, discussed with therapy -Right wrist swelling: XR ordered and shows no acute fracture, shows 2.32mm ulnar variance, brace removed to given patient a break from it. Voltaren  gel ordered prn for discomfort, continue, CT ordered and show no acute osseus injury, continue ace wrap   -Foot pain: tramadol  ordered prn, improved today, continue prn  -Headaches:continue tylenol  and tramadol  prn -Low back pain: continue kpad, asked [place]  to please bring for her -Whole body hypersensitivity: gabapentin  100mg  TID ordered -Pain in left heel: XR  obtained and negative, cam boot ordered to help offload heel with therapy, discussed that this has helped -R shoulder pain: XR neg for acute etiology on 04/06/23, voltaren  gel ordered--encourage ROM as tolerated 1/3- denying any pain  4. History of anxiety: discussed with her mom that she feels this was a big risk factor for her accident, propanolol d/ced due to hypotension. Continue magnesium  supplement. Asked SW to place her on neuropsych list, Buspar  ordered prn, discussed placing outpatient referral for behavioral health f/u  5. Neuropsych/cognition: This patient is not capable of making decisions on her own behalf  - 10/12: Still not capable of medical decision making; BUT oriented and communicating, following directions - MUCH improved - PRN melatonin 3mg    for insomnia  - Buspar  prn  6. Skin: -Buttock/sacrum--foam dressing, pressure relief, nutrition -stage 3 left heel, continue foam dressing/ pressure relief  7. Fluids/Electrolytes/Nutrition: Routine in and outs with follow-up  chemistries  -Eating  well with cueing   -Dysphagia: continue D3/thins--tolerating this diet -Low protein stores: d/c prosource since patient hates these and is eating 100% of meals -Suboptimal vitamin D  level: increase to 5,000 U daily. continue this dose  -History of magnesium  deficiency: magnesium  500mg  stopped to support BP  - good po intake 8.  Cavitary right lower lobe pneumonia likely aspiration pneumonia/MRSA pneumonia.  Resolved    Latest Ref Rng & Units 05/16/2023    6:28 AM 05/02/2023    5:20 AM 04/18/2023    5:16 AM  CBC  WBC 4.0 - 10.5 K/uL 6.2  5.9  6.0   Hemoglobin 12.0 - 15.0 g/dL 85.0  85.1  85.9   Hematocrit 36.0 - 46.0 % 42.1  43.0  41.6   Platelets 150 - 400 K/uL 300  241  194      9.  History of drug abuse.  Positive cocaine on urine drug screen.  Provide counseling  10.  AKI/hypovolemia and ATN.  Resolved     Latest Ref Rng & Units 05/16/2023    6:28 AM 05/02/2023    5:20 AM 04/18/2023    5:16 AM  BMP  Glucose 70 - 99 mg/dL 87  89  79   BUN 6 - 20 mg/dL 15  14  17    Creatinine 0.44 - 1.00 mg/dL 9.24  9.20  9.32   Sodium 135 - 145 mmol/L 136  138  137   Potassium 3.5 - 5.1 mmol/L 3.9  4.0  3.8   Chloride 98 - 111 mmol/L 105  109  105   CO2 22 - 32 mmol/L 22  23  23    Calcium  8.9 - 10.3 mg/dL 8.9  8.9  9.0     11.  Mild transaminitis with rhabdomyolysis.  Both resolved  12.  Hypotension:  D/c amantadine  -12/28-29/24 BPs mostly stable, monitor 12/31-1/1 BP soft, but per therapy, not orthostatic 1/2-3- BP soft- 90s/50s- but not orthostatic Sx's per staff--careful with flomax  on board Vitals:   05/15/23 0256 05/15/23 0504 05/15/23 0511 05/15/23 1421  BP: 119/81 102/75 103/70 112/82   05/15/23 2017 05/16/23 0522 05/16/23 1300 05/16/23 1921  BP: 90/64 108/74 111/75 105/65   05/17/23 0321 05/17/23 1257 05/17/23 1922 05/18/23 0503  BP: 95/75 104/76 102/64 91/61    13. HTN: resolved   14. Impaired initiation: resolved, d/c amantadine   15.  Hyperglycemia: CBGs mildly elevated, d/ced checks. Prosource d/ced as per patient's request  16. Spasticity:  Continue wrist, elbow brace, and PRAFOs. Ordered a night splint for LLE  D/c baclofen   and tizanidine - much improved  17.  Dyskinetic movements in head and neck - appears to be myoclonic jerks related to hypoxic brain injury - as discussed with pt mom, no specific meds for this on antispasticity meds , consioder benzo although with pt's SUD would avoid this class - EEG reviewed and was negative for seizure.  -  resolved   18. Cervical extensor weakness: decrease tizanidine  to 2mg  HS, decrease baclofen  to 5mg  QHS, improved, d/c'd  19. Bowel and bladder incontinence: continue bowel and bladder program. D/c miralax , decrease tizanidine  to 2mg  HS, d/c'd  12/30- LBM 2 days ago- was large- co abd pain- will get KUB- if full of stool- will need to be cleaned out.      1/3--appears to be voiding better with addition of flomax . No dizziness reported but will need to be careful with soft bp's   -we ned to scan for  a few pvr's to see how much she's emptying       -last bm 1/3 20. Fatigue: resolved, discussed with team may be secondary to anti-spasticity medications, B12 reviewed and is 335, in suboptimal range, will give 1,000U B12 injection 8/7, will order monthly as needed, metanx started, increase to BID, continue, d/c tizanidine , discussed with patient, d/c baclofen    21. MRSA nares positive: negative on repeat, precautions d/ced  22. Tachycardic: resolved, d/c propanolol. Continue tizanidine  2mg  HS. Resolved, amantadine  increased back to 100mg  BID, continue this dose, resolved  23. S/p fall: CT head reviewed and is stable  24. Bilateral foot drop: bilateral AFOs ordered, discussed that these can help her to walk better  25. Vaginal itching: completed fluconazole . Having some discharge now. See below  26. Dysuria -treated twice for UTI's.   12/30- added Pyridium  100 mg TID x 2 days-  to help dysuria. 12/31- no change in Dysuria- per nursing, has had since before Xmas- might be tear in mons/vaginal mucosa that's stinging when voids-   1/1- pt reports Burning gone is good- is done with ABX and pyridium - pt hates pyridium - freaks her out with color change.   1/3 see neurogenic bladder discussion above 27. Poor sitting balance: continue OT/PT, improving  28. Headaches: topamax  62m daily prn ordered, continue  29. Drowsiness with tramadol : d/ced  30. IUD in place? Pt states she has IUD x25yrs  and believes it's supposed to be removed soon(chart review- Dr. Lavoie placed Mirena  IUD on 07/01/19, good for 63yrs); needs outpatient follow-up with OB/GYN  31. Constipation: d/c baclofen , last BM 12/18, milk of magnesia ordered 12/20, LBM 12/28, continue magnesium  gluconate  12/30- LBM 12/28- but c/o abd pain- get KUB to see if needs to give Sorbitol .  32. History of drug addiction: tramadol  d/ced as per mom's preference  33. Depression: discussed scheduling for outpatient psychology/psychiatry follow-up, discussed neuropsych f/u while in hospital  -appreciate Dr. Corina follow up 34. Sinusitis: claritin  ordered prn, continue      LOS: 181 days A FACE TO FACE EVALUATION WAS PERFORMED  Nicole [name] 05/18/2023, 10:35 AM

## 2023-05-18 NOTE — Progress Notes (Signed)
 Speech Language Pathology Weekly Progress Note  Patient Details  Name: Nicole [name] MRN: [id number] Date of Birth: 29-Dec-1975  Beginning of progress report period: May 11, 2023 End of progress report period: May 17, 2022  Short Term Goals: Week 9: SLP Short Term Goal 1 (Week 9): Week 25 - Pt will solve mildly complex environmental problems with 60% accuracy given min assist. SLP Short Term Goal 1 - Progress (Week 9): Met SLP Short Term Goal 2 (Week 9): Week 25 - Pt will recall basic daily information using external aids with 90% accuracy given mod assist. SLP Short Term Goal 2 - Progress (Week 9): Met SLP Short Term Goal 3 (Week 9): Week 25 - Pt will utilize recommended handwriting strategies to achieve 90% legebility given mod verbal cues in 4/5 opportunities at the phrase and sentence levels. SLP Short Term Goal 3 - Progress (Week 9): Met SLP Short Term Goal 4 (Week 9): Week 25 - Pt will utilize increased vocal intensity w/ min verbal cues to remain 90% intelligibile at conversation level in noisy environments in 4/5 opportunities. SLP Short Term Goal 4 - Progress (Week 9): Not met  New Short Term Goals: Week 9: SLP Short Term Goal 1 (Week 9): Week 26 - Pt will solve mildly complex environmental problems with 70% accuracy given min assist. SLP Short Term Goal 2 (Week 9): Week 26 - Pt will recall basic daily information using external aids with 90% accuracy given min assist. SLP Short Term Goal 3 (Week 9): Week 26 - Pt will utilize recommended handwriting strategies to achieve 90% legebility given min verbal cues in 4/5 opportunities at the phrase and sentence levels. SLP Short Term Goal 4 (Week 9): Week 26 - Pt will utilize increased vocal intensity w/ min verbal cues to remain 90% intelligibile at conversation level in noisy environments in 4/5 opportunities.  Weekly Progress Updates: Patient has made excellent progress towards therapy goals, meeting 3/4 short term goals set this  reporting period. Patient currently requires min-mod cues for legible handwriting and for recall of daily events using external aids. Patient solves mildly complex problems with 60% accuracy given min assist. Speech intelligibility in noisy environments not targeted this reporting period, thus not met. Patient and family education ongoing. Patient will continue to benefit from skilled therapy services during remainder of CIR stay.   Intensity: Minumum of 1-2 x/day, 30 to 90 minutes Frequency: 3 to 5 out of 7 days Duration/Length of Stay: TBD due to SNF placement Treatment/Interventions: Cognitive remediation/compensation;Internal/external aids;Speech/Language facilitation;Cueing hierarchy;Environmental controls;Therapeutic Activities;Functional tasks;Multimodal communication approach;Patient/family education;Therapeutic Exercise  Rosina Downy, M.A., CCC-SLP   Pebbles Zeiders A Josetta Wigal 05/18/2023, 8:15 AM

## 2023-05-19 NOTE — Plan of Care (Signed)
  Problem: Consults Goal: RH STROKE PATIENT EDUCATION Description: See Patient Education module for education specifics  Outcome: Progressing   Problem: RH BOWEL ELIMINATION Goal: RH STG MANAGE BOWEL WITH ASSISTANCE Description: STG Manage Bowel with mod I Assistance. Outcome: Progressing Goal: RH STG MANAGE BOWEL W/MEDICATION W/ASSISTANCE Description: STG Manage Bowel with Medication with mod I Assistance. Outcome: Progressing   Problem: RH BLADDER ELIMINATION Goal: RH STG MANAGE BLADDER WITH ASSISTANCE Description: STG Manage Bladder With toileting Assistance Outcome: Progressing   Problem: RH SKIN INTEGRITY Goal: RH STG MAINTAIN SKIN INTEGRITY WITH ASSISTANCE Description: STG Maintain Skin Integrity With min  Assistance. Outcome: Progressing

## 2023-05-19 NOTE — Progress Notes (Signed)
 Occupational Therapy Session Note  Patient Details  Name: Nicole [name] MRN: [id number] Date of Birth: 21-Dec-1975  Today's Date: 05/19/2023 OT Individual Time: 9074-8954 OT Individual Time Calculation (min): 80 min    Short Term Goals: Week 5:  OT Short Term Goal 1 (Week 5): Patient will grab hair to place in ponytail with BUE OT Short Term Goal 1 - Progress (Week 5): Other (comment) OT Short Term Goal 2 (Week 5): Patient will trial wearing underwear during weekday x 2 OT Short Term Goal 2 - Progress (Week 5): Other (comment) OT Short Term Goal 3 (Week 5): Patient will complete laundry with min assist. OT Short Term Goal 3 - Progress (Week 5): Other (comment) OT Short Term Goal 4 (Week 5): Patient will prepare a simple snack while walking in the kitchen OT Short Term Goal 4 - Progress (Week 5): Other (comment) OT Short Term Goal 5 (Week 5): xxx OT Short Term Goal 5 - Progress (Week 5): Other (comment)  Skilled Therapeutic Interventions/Progress Updates:  Skilled OT intervention completed with focus on functional mobility and scapular and glenohumeral mobility. Pt received seated in w/c with direct care handoff from current PT. Pt agreeable to session. No pain reported.  With prompting, pt agreeable to trial toileting prior to exiting room. Completed all sit > stands and ambulatory transfers with CGA using RW, with cues for body direction and safe use of AD.   Ambulated > toilet, CGA for lowering clothing, but despite + time, pt unable to void. Pt reports this has been ongoing. Pt easily distracted on commode with conversation with OT but unable to void despite quiet environment. CGA for balance to donn clothing back over hips, then ambulated > gym.   Max cues needed for sequencing how to transition sitting EOM > supine with wedge and bolster offered for comfort. OT applied hot pack to Lt shoulder in prep for PROM, and remained on for 10 mins. Removed without skin irritation noted. While heat  was applied, OT completed gentle myofascial release to Lt forearm and palm, with mod/max restrictions noted. Then completed prolonged stretching including elbow, wrist and digit extension. Pt noted to be lacking about 10 degrees elbow extension and about 20% of digit extension and verbalized that the stretching really helped.  Transitioned to Lt side lying with supervision however again mod cues for sequencing body positioning, then OT completed scapular mobilization including scapular protraction/retraction and depression with min/mod restrictions noted, but improved assisted shoulder flexion range following manual techniques. Transitioned supine, then pt completed the following AAROM exercises to promote glenohumeral gliding of LUE needed for BADLs and functional needs: (With 1 pound dowel) -shoulder flexion 2x15 -scapular protraction 2x15 -shoulder external rotation 2x15 -pt needed cues for form  Transitioned with supervision to EOM, then ambulated back to room to bathroom. CGA to lower clothing but again despite time unable to void. Pt voiced trying to have BM but unable to go; NT notified. CGA to redonn clothing, cues for safety at sink for keeping RW in front of her vs stepping to side during hand hygiene, then ambulated to w/c.   Pt remained seated in w/c, with belt alarm on/activated, and with all needs in reach at end of session.   Therapy Documentation Precautions:  Precautions Precautions: Fall Precaution Comments: decreased stand balance- reliant on UE, Pain in LLE limits mobility at times Required Braces or Orthoses: Other Brace Other Brace: B adjustable night splints; B elbow cosey splints; L palm protector; R mitt Restrictions Weight  Bearing Restrictions Per Provider Order: No    Therapy/Group: Individual Therapy  [name], MS, OTR/L  05/19/2023, 11:19 AM

## 2023-05-19 NOTE — Progress Notes (Signed)
 PROGRESS NOTE   Subjective/Complaints:  Pt doing great this weekend. Slept ok, denies pain, LBM yesterday and then again this morning, urinating better, denies any other complaints or concerns today.   ROS: as per HPI. Denies CP, SOB, abd pain, N/V/D/C, or any other complaints at this time.      Objective:   No results found.   No results for input(s): WBC, HGB, HCT, PLT in the last 72 hours.         No results for input(s): NA, K, CL, CO2, GLUCOSE, BUN, CREATININE, CALCIUM  in the last 72 hours.          Intake/Output Summary (Last 24 hours) at 05/19/2023 1151 Last data filed at 05/19/2023 0718 Gross per 24 hour  Intake 240 ml  Output --  Net 240 ml           Physical Exam: Vital Signs Blood pressure 101/60, pulse 88, temperature 98.5 F (36.9 C), resp. rate 17, height 5' 1 (1.549 m), weight 50.5 kg, SpO2 97%.        Constitutional: No distress . Vital signs reviewed. Up walking with RW with therapy HEENT: NCAT, EOMI, oral membranes moist Neck: supple Cardiovascular: RRR without murmur. No JVD    Respiratory/Chest: CTA Bilaterally without wheezes or rales. Normal effort    GI/Abdomen: BS +, non-tender, non-distended Ext: no clubbing, cyanosis, or edema Psych: flat but cooperative  Skin: + Scaling dried skin on bilateral heels left buttock area no examined as she was up and dressed + Left heel stage III DTI has resolved Neuro: memory improving. Remembers staff and myself  PRIOR EXAMS: Neurologic: AAOx2, fair insight and awareness.[name]  Phonation and speech intelligability much improved. Fair insight. MAS 1 right elbow,MAS  tr to 1 in RIght finger and wrist flexors  MAS 1 in extensors of LE's MAS 1 L elbow , MAS 1+ in L wrist and finger flexors. Hypersensitivity to left hand improved, Left sided strength improved, decreased range of motion and pain at end range of  motion in left shoulder  Musculoskeletal:some limitations in right shoulder ROM     Assessment/Plan: 1. Functional deficits which require 3+ hours per day of interdisciplinary therapy in a comprehensive inpatient rehab setting. Physiatrist is providing close team supervision and 24 hour management of active medical problems listed below. Physiatrist and rehab team continue to assess barriers to discharge/monitor patient progress toward functional and medical goals  Care Tool:  Bathing    Body parts bathed by patient: Face, Abdomen, Chest, Left upper leg, Right upper leg, Left arm, Front perineal area, Right arm, Right lower leg, Left lower leg, Buttocks   Body parts bathed by helper: Buttocks Body parts n/a: Right arm, Left arm, Front perineal area, Buttocks, Right upper leg, Left upper leg, Right lower leg, Left lower leg   Bathing assist Assist Level: Supervision/Verbal cueing     Upper Body Dressing/Undressing Upper body dressing   What is the patient wearing?: Pull over shirt    Upper body assist Assist Level: Set up assist    Lower Body Dressing/Undressing Lower body dressing      What is the patient wearing?: Underwear/pull up, Pants  Lower body assist Assist for lower body dressing: Minimal Assistance - Patient > 75%     Toileting Toileting Toileting Activity did not occur Press Photographer and hygiene only): N/A (no void or bm)  Toileting assist Assist for toileting: Minimal Assistance - Patient > 75%     Transfers Chair/bed transfer  Transfers assist  Chair/bed transfer activity did not occur: Safety/medical concerns  Chair/bed transfer assist level: Contact Guard/Touching assist Chair/bed transfer assistive device: Walker, Armrests   Locomotion Ambulation   Ambulation assist   Ambulation activity did not occur: Safety/medical concerns  Assist level: Contact Guard/Touching assist Assistive device: Walker-rolling Max distance: 150   Walk  10 feet activity   Assist  Walk 10 feet activity did not occur: Safety/medical concerns  Assist level: Contact Guard/Touching assist Assistive device: Walker-rolling   Walk 50 feet activity   Assist Walk 50 feet with 2 turns activity did not occur: Safety/medical concerns  Assist level: Contact Guard/Touching assist Assistive device: Walker-rolling    Walk 150 feet activity   Assist Walk 150 feet activity did not occur: Safety/medical concerns  Assist level: Contact Guard/Touching assist Assistive device: Walker-rolling    Walk 10 feet on uneven surface  activity   Assist Walk 10 feet on uneven surfaces activity did not occur: Safety/medical concerns   Assist level: Minimal Assistance - Patient > 75% Assistive device: Walker-rolling   Wheelchair     Assist Is the patient using a wheelchair?: No Type of Wheelchair: Manual    Wheelchair assist level: Moderate Assistance - Patient 50 - 74% Max wheelchair distance: 38ft    Wheelchair 50 feet with 2 turns activity    Assist    Wheelchair 50 feet with 2 turns activity did not occur: Safety/medical concerns   Assist Level: Supervision/Verbal cueing   Wheelchair 150 feet activity     Assist  Wheelchair 150 feet activity did not occur: Safety/medical concerns   Assist Level: Moderate Assistance - Patient 50 - 74%   Blood pressure 101/60, pulse 88, temperature 98.5 F (36.9 C), resp. rate 17, height 5' 1 (1.549 m), weight 50.5 kg, SpO2 97%.  Medical Problem List and Plan: 1. 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 Awaiting approval of disability. Placement  pending. Hoping that hospital will provide LOG for placement. Code status  DNR -Continue CIR therapies including PT, OT, and SLP  -still waiting on SNF offers  2.  Impaired mobility: d/c lovenox  since ambulating >150 feet, SCDs ordered  3. Knee pain: Provided list of foods for pain. Tylenol  as needed, continue voltaren  gel- changed to prn. XR reviewed and is stable, discussed with therapy -Right wrist swelling: XR ordered and shows no acute fracture, shows 2.87mm ulnar variance, brace removed to given patient a break from it. Voltaren  gel ordered prn for discomfort, continue, CT ordered and show no acute osseus injury, continue ace wrap   -Foot pain: tramadol  ordered prn, improved today, continue prn  -Headaches:continue tylenol  and tramadol  prn -Low back pain: continue kpad, asked [place]  to please bring for her -Whole body hypersensitivity: gabapentin  100mg  TID ordered -Pain in left heel: XR obtained and negative, cam boot ordered to help offload heel with therapy,  discussed that this has helped -R shoulder pain: XR neg for acute etiology on 04/06/23, voltaren  gel ordered--encourage ROM as tolerated 1/3-4 denying any pain   4. History of anxiety: discussed with her mom that she feels this was a big risk factor for her accident, propanolol d/ced due to hypotension. Continue magnesium  supplement. Asked SW to place her on neuropsych list, Buspar  ordered prn, discussed placing outpatient referral for behavioral health f/u  5. Neuropsych/cognition: This patient is not capable of making decisions on her own behalf  - 10/12: Still not capable of medical decision making; BUT oriented and communicating, following directions - MUCH improved - PRN melatonin 3mg    for insomnia  - Buspar  prn  6. Skin: -Buttock/sacrum--foam dressing, pressure relief, nutrition -stage 3 left heel, continue foam dressing/ pressure relief  7. Fluids/Electrolytes/Nutrition: Routine in and outs with follow-up  chemistries  -Eating  well with cueing   -Dysphagia: continue D3/thins--tolerating this diet -Low protein stores: d/c prosource since patient hates these and is eating 100% of meals -Suboptimal vitamin D  level: increase to 5,000 U daily. continue this dose  -History of magnesium  deficiency: magnesium  500mg  stopped to support BP  - good po intake  8.  Cavitary right lower lobe pneumonia likely aspiration pneumonia/MRSA pneumonia.  Resolved    Latest Ref Rng & Units 05/16/2023    6:28 AM 05/02/2023    5:20 AM 04/18/2023    5:16 AM  CBC  WBC 4.0 - 10.5 K/uL 6.2  5.9  6.0   Hemoglobin 12.0 - 15.0 g/dL 85.0  85.1  85.9   Hematocrit 36.0 - 46.0 % 42.1  43.0  41.6   Platelets 150 - 400 K/uL 300  241  194      9.  History of drug abuse.  Positive cocaine on urine drug screen.  Provide counseling  10.  AKI/hypovolemia and ATN.  Resolved     Latest Ref Rng & Units 05/16/2023    6:28 AM 05/02/2023    5:20 AM 04/18/2023    5:16 AM  BMP  Glucose 70 - 99 mg/dL 87  89  79   BUN 6 - 20 mg/dL 15  14  17    Creatinine 0.44 - 1.00 mg/dL 9.24  9.20  9.32   Sodium 135 - 145 mmol/L 136  138  137   Potassium 3.5 - 5.1 mmol/L 3.9  4.0  3.8   Chloride 98 - 111 mmol/L 105  109  105   CO2 22 - 32 mmol/L 22  23  23    Calcium  8.9 - 10.3 mg/dL 8.9  8.9  9.0     11.  Mild transaminitis with rhabdomyolysis.  Both resolved  12.  Hypotension:  D/c amantadine  -12/28-29/24 BPs mostly stable, monitor 12/31-1/1 BP soft, but per therapy, not orthostatic 1/2-3- BP soft- 90s/50s- but not orthostatic Sx's per staff--careful with flomax  on board 05/19/23 BPs staying stable, cont monitoring Vitals:   05/15/23 1421 05/15/23 2017 05/16/23 0522 05/16/23 1300  BP: 112/82 90/64 108/74 111/75   05/16/23 1921 05/17/23 0321 05/17/23 1257 05/17/23 1922  BP: 105/65 95/75 104/76 102/64   05/18/23 0503 05/18/23 1508 05/18/23 1914 05/19/23 0306  BP: 91/61 106/68 102/64 101/60    13. HTN: resolved   14. Impaired initiation:  resolved, d/c amantadine   15. Hyperglycemia: CBGs mildly elevated, d/ced checks. Prosource d/ced as per patient's request  16. Spasticity:  Continue wrist, elbow brace, and PRAFOs. Ordered a night splint for LLE  D/c baclofen  and tizanidine - much improved  17.  Dyskinetic movements in head and neck - appears to be myoclonic jerks related to hypoxic brain injury - as discussed with pt mom, no specific meds for this on antispasticity meds , consioder benzo although with pt's SUD would avoid this class - EEG reviewed and was negative for seizure.  -  resolved   18. Cervical extensor weakness: decrease tizanidine  to 2mg  HS, decrease baclofen  to 5mg  QHS, improved, d/c'd  19. Bowel and bladder incontinence: continue bowel and bladder program. D/c miralax , decrease tizanidine  to 2mg  HS, d/c'd  12/30- LBM 2 days ago- was large- co abd pain- will get KUB- if full of stool- will need to be cleaned out.     1/3--appears to be voiding better with addition of flomax . No dizziness reported but will need to be careful with soft bp's   -we ned to scan for  a few pvr's to see how much she's emptying       -last bm 1/3 -05/19/23 BM today, PVRs low, cont regimen but don't need to cont checking PVRs at this point.   20. Fatigue: resolved, discussed with team may be secondary to anti-spasticity medications, B12 reviewed and is 335, in suboptimal range, will give 1,000U B12 injection 8/7, will order monthly as needed, metanx started, increase to BID, continue, d/c tizanidine , discussed with patient, d/c baclofen    21. MRSA nares positive: negative on repeat, precautions d/ced  22. Tachycardic: resolved, d/c propanolol. Continue tizanidine  2mg  HS. Resolved, amantadine  increased back to 100mg  BID, continue this dose, resolved  23. S/p fall: CT head reviewed and is stable  24. Bilateral foot drop: bilateral AFOs ordered, discussed that these can help her to walk better  25. Vaginal itching: completed  fluconazole . Having some discharge now. See below  26. Dysuria -treated twice for UTI's.   12/30- added Pyridium  100 mg TID x 2 days- to help dysuria. 12/31- no change in Dysuria- per nursing, has had since before Xmas- might be tear in mons/vaginal mucosa that's stinging when voids-   1/1- pt reports Burning gone is good- is done with ABX and pyridium - pt hates pyridium - freaks her out with color change.   1/3 see neurogenic bladder discussion above  27. Poor sitting balance: continue OT/PT, improving  28. Headaches: topamax  60m daily prn ordered, continue  29. Drowsiness with tramadol : d/ced  30. IUD in place? Pt states she has IUD x21yrs  and believes it's supposed to be removed soon(chart review- Dr. Lavoie placed Mirena  IUD on 07/01/19, good for 6yrs); needs outpatient follow-up with OB/GYN  31. Constipation: d/c baclofen , last BM 12/18, milk of magnesia ordered 12/20, LBM 12/28, continue magnesium  gluconate  12/30- LBM 12/28- but c/o abd pain- get KUB to see if needs to give Sorbitol .   -05/19/23 LBM today, cont regimen  32. History of drug addiction: tramadol  d/ced as per mom's preference  33. Depression: discussed scheduling for outpatient psychology/psychiatry follow-up, discussed neuropsych f/u while in hospital  -appreciate Dr. Corina follow up 34. Sinusitis: claritin  ordered prn, continue      LOS: 182 days A FACE TO FACE EVALUATION WAS PERFORMED  [address] 05/19/2023, 11:51 AM

## 2023-05-19 NOTE — Progress Notes (Signed)
 Speech Language Pathology Daily Session Note  Patient Details  Name: Nicole [name] MRN: [id number] Date of Birth: 1975/06/02  Today's Date: 05/19/2023 SLP Individual Time: 1446-1530 SLP Individual Time Calculation (min): 44 min  Short Term Goals: Week 9: SLP Short Term Goal 1 (Week 9): Week 26 - Pt will solve mildly complex environmental problems with 70% accuracy given min assist. SLP Short Term Goal 2 (Week 9): Week 26 - Pt will recall basic daily information using external aids with 90% accuracy given min assist. SLP Short Term Goal 3 (Week 9): Week 26 - Pt will utilize recommended handwriting strategies to achieve 90% legebility given min verbal cues in 4/5 opportunities at the phrase and sentence levels. SLP Short Term Goal 4 (Week 9): Week 26 - Pt will utilize increased vocal intensity w/ min verbal cues to remain 90% intelligibile at conversation level in noisy environments in 4/5 opportunities.  Skilled Therapeutic Interventions: Skilled therapy session focused on cognitive goals. Upon entrance, patient verbalized need for peri-care due to episode of incontinence. SLP and NT aided in transfer to BR via RW. Upon return to [facility], SLP initiated session. SLP targeted problem solving and hand writing goal through completion of cross word puzzle. Patient required minA to complete cross word puzzle and utilize hand writing strategies of slow and large to increase legibility. Patient left in [employer] [facility] with alarm set and call bell in reach at end of session. Continue POC.  Pain None reported  Therapy/Group: Individual Therapy  Justen Fonda M.A., CF-SLP 05/19/2023, 7:37 AM

## 2023-05-19 NOTE — Progress Notes (Signed)
 Physical Therapy Session Note  Patient Details  Name: Nicole [name] MRN: [id number] Date of Birth: 08-28-1975  Today's Date: 05/19/2023 PT Individual Time: 9154-9074 PT Individual Time Calculation (min): 40 min   Short Term Goals: See flowsheets for STG  Skilled Therapeutic Interventions/Progress Updates:    Chart reviewed and pt agreeable to therapy. Pt received seated in WC with no c/o pain. Session focused on amb independence to promote safe home access. Pt initiated session with amb of 258ft to therapy gym using CGA + RW. Pt then completed blocked practice of amb with no AD for distances of 10ft - 60ft. Pt noted to have decreased stance on RLE limiting LLE step length. Pt also completed SLS practice on RLE to facilitate improved LLE stepping with gait. Pt noted to have challenge with keeping LLE lifted without hinging at hip to compensate. Pt able to briefly overcome hip hinging with strong VC during R SLS. At end of session, pt was left seated in [facility] with direct handoff to OT.     Therapy Documentation Precautions:  Precautions Precautions: Fall Precaution Comments: decreased stand balance- reliant on UE, Pain in LLE limits mobility at times Required Braces or Orthoses: Other Brace Other Brace: B adjustable night splints; B elbow cosey splints; L palm protector; R mitt Restrictions Weight Bearing Restrictions Per Provider Order: No General:       Therapy/Group: Individual Therapy  [name] [name] 05/19/2023, 10:16 AM

## 2023-05-20 NOTE — Progress Notes (Signed)
 Occupational Therapy Session Note  Patient Details  Name: Nicole [name] MRN: [id number] Date of Birth: 1975/12/08  Today's Date: 05/21/2023 OT Individual Time: 1120-1200 OT Individual Time Calculation (min): 40 min    Short Term Goals: Week 9: OT Short Term Goal 1 (Week 9): week 18: Pt will transfer to toilet with mod A with RW OT Short Term Goal 1 - Progress (Week 9): Met OT Short Term Goal 2 (Week 9): Week 18: Pt will be able to come to EOB in prep for ADLs with min A OT Short Term Goal 2 - Progress (Week 9): Progressing toward goal OT Short Term Goal 3 (Week 9): Pt will feed self 50% of meal with min A OT Short Term Goal 3 - Progress (Week 9): Met OT Short Term Goal 4 (Week 9): Week 18: Pt will use external aids to be oriented x3 with mod A OT Short Term Goal 4 - Progress (Week 9): Not met  Skilled Therapeutic Interventions/Progress Updates:  Pt greeted sitting in [facility] for skilled OT session with focus on functional mobility, IADL participation, and standing balance/tolerance.   Pain: Pt with no reports of pain, OT offering intermediate rest breaks and positioning suggestions throughout session to address potential pain/fatigue and maximize participation/safety in session.   Functional Transfers: Sit<>stands and functional mobility at greater than household-level distance with close supervision + youth RW. Pt observed to be veering towards R-side of hallway with fatigue, intermediately bumping RW with RLE.  Self Care Tasks: Pt loads laundry into washer with close supervision, no cuing required for sequencing of buttons to start load.   Therapeutic Activities: At [facility] modality, pt participates in x3 reps of visual scanning & reaction test, utilizing BUE to hit targets. Pt more easily distracted with continued repetitions.   Pt remained sitting in WC with 4Ps assessed and immediate needs met. Pt continues to be appropriate for skilled OT intervention to promote further functional  independence in ADLs/IADLs.   Therapy Documentation Precautions:  Precautions Precautions: Fall Precaution Comments: decreased stand balance- reliant on UE, Pain in LLE limits mobility at times Required Braces or Orthoses: Other Brace Other Brace: B adjustable night splints; B elbow cosey splints; L palm protector; R mitt Restrictions Weight Bearing Restrictions Per Provider Order: No   Therapy/Group: Individual Therapy  Nereida Habermann, OTR/L, MSOT  05/21/2023, 12:45 PM

## 2023-05-20 NOTE — Plan of Care (Signed)
  Problem: Consults Goal: RH STROKE PATIENT EDUCATION Description: See Patient Education module for education specifics  Outcome: Progressing   Problem: RH BOWEL ELIMINATION Goal: RH STG MANAGE BOWEL WITH ASSISTANCE Description: STG Manage Bowel with mod I Assistance. Outcome: Progressing Goal: RH STG MANAGE BOWEL W/MEDICATION W/ASSISTANCE Description: STG Manage Bowel with Medication with mod I Assistance. Outcome: Progressing   Problem: RH BLADDER ELIMINATION Goal: RH STG MANAGE BLADDER WITH ASSISTANCE Description: STG Manage Bladder With toileting Assistance Outcome: Progressing   Problem: RH SKIN INTEGRITY Goal: RH STG MAINTAIN SKIN INTEGRITY WITH ASSISTANCE Description: STG Maintain Skin Integrity With min  Assistance. Outcome: Progressing

## 2023-05-20 NOTE — Progress Notes (Signed)
 Uneventful night. Incontinent of urine even with timed toileting. Wore SCD's most of night. Refused all other braces/splints. PRN melatonin given at 2000. Nicole [name]

## 2023-05-20 NOTE — Progress Notes (Signed)
 PROGRESS NOTE   Subjective/Complaints:  Pt doing great again today. Slept well, denies pain, LBM yesterday, urinating fine (occasional incontinence per nursing), denies any other complaints or concerns today.   ROS: as per HPI. Denies CP, SOB, abd pain, N/V/D/C, or any other complaints at this time.      Objective:   No results found.   No results for input(s): WBC, HGB, HCT, PLT in the last 72 hours.         No results for input(s): NA, K, CL, CO2, GLUCOSE, BUN, CREATININE, CALCIUM  in the last 72 hours.          Intake/Output Summary (Last 24 hours) at 05/20/2023 1059 Last data filed at 05/20/2023 0914 Gross per 24 hour  Intake 600 ml  Output --  Net 600 ml           Physical Exam: Vital Signs Blood pressure 105/73, pulse 74, temperature 98.2 F (36.8 C), resp. rate 18, height 5' 1 (1.549 m), weight 58 kg, SpO2 97%.        Constitutional: No distress . Vital signs reviewed. Resting in bed HEENT: NCAT, EOMI, oral membranes moist Neck: supple Cardiovascular: RRR without murmur. No JVD    Respiratory/Chest: CTA Bilaterally without wheezes or rales. Normal effort    GI/Abdomen: BS +, non-tender, non-distended Ext: no clubbing, cyanosis, or edema Psych: flat but cooperative  Skin: + Scaling dried skin on bilateral heels left buttock area no examined as she was up and dressed + Left heel stage III DTI has resolved Neuro: memory improving. Remembers staff and myself by name!  PRIOR EXAMS: Neurologic: AAOx2, fair insight and awareness.[name]  Phonation and speech intelligability much improved. Fair insight. MAS 1 right elbow,MAS  tr to 1 in RIght finger and wrist flexors  MAS 1 in extensors of LE's MAS 1 L elbow , MAS 1+ in L wrist and finger flexors. Hypersensitivity to left hand improved, Left sided strength improved, decreased range of motion and pain at end range of  motion in left shoulder  Musculoskeletal:some limitations in right shoulder ROM     Assessment/Plan: 1. Functional deficits which require 3+ hours per day of interdisciplinary therapy in a comprehensive inpatient rehab setting. Physiatrist is providing close team supervision and 24 hour management of active medical problems listed below. Physiatrist and rehab team continue to assess barriers to discharge/monitor patient progress toward functional and medical goals  Care Tool:  Bathing    Body parts bathed by patient: Face, Abdomen, Chest, Left upper leg, Right upper leg, Left arm, Front perineal area, Right arm, Right lower leg, Left lower leg, Buttocks   Body parts bathed by helper: Buttocks Body parts n/a: Right arm, Left arm, Front perineal area, Buttocks, Right upper leg, Left upper leg, Right lower leg, Left lower leg   Bathing assist Assist Level: Supervision/Verbal cueing     Upper Body Dressing/Undressing Upper body dressing   What is the patient wearing?: Pull over shirt    Upper body assist Assist Level: Set up assist    Lower Body Dressing/Undressing Lower body dressing      What is the patient wearing?: Underwear/pull up, Pants     Lower  body assist Assist for lower body dressing: Minimal Assistance - Patient > 75%     Toileting Toileting Toileting Activity did not occur Press Photographer and hygiene only): N/A (no void or bm)  Toileting assist Assist for toileting: Minimal Assistance - Patient > 75%     Transfers Chair/bed transfer  Transfers assist  Chair/bed transfer activity did not occur: Safety/medical concerns  Chair/bed transfer assist level: Contact Guard/Touching assist Chair/bed transfer assistive device: Walker, Armrests   Locomotion Ambulation   Ambulation assist   Ambulation activity did not occur: Safety/medical concerns  Assist level: Contact Guard/Touching assist Assistive device: Walker-rolling Max distance: 150   Walk  10 feet activity   Assist  Walk 10 feet activity did not occur: Safety/medical concerns  Assist level: Contact Guard/Touching assist Assistive device: Walker-rolling   Walk 50 feet activity   Assist Walk 50 feet with 2 turns activity did not occur: Safety/medical concerns  Assist level: Contact Guard/Touching assist Assistive device: Walker-rolling    Walk 150 feet activity   Assist Walk 150 feet activity did not occur: Safety/medical concerns  Assist level: Contact Guard/Touching assist Assistive device: Walker-rolling    Walk 10 feet on uneven surface  activity   Assist Walk 10 feet on uneven surfaces activity did not occur: Safety/medical concerns   Assist level: Minimal Assistance - Patient > 75% Assistive device: Walker-rolling   Wheelchair     Assist Is the patient using a wheelchair?: No Type of Wheelchair: Manual    Wheelchair assist level: Moderate Assistance - Patient 50 - 74% Max wheelchair distance: 53ft    Wheelchair 50 feet with 2 turns activity    Assist    Wheelchair 50 feet with 2 turns activity did not occur: Safety/medical concerns   Assist Level: Supervision/Verbal cueing   Wheelchair 150 feet activity     Assist  Wheelchair 150 feet activity did not occur: Safety/medical concerns   Assist Level: Moderate Assistance - Patient 50 - 74%   Blood pressure 105/73, pulse 74, temperature 98.2 F (36.8 C), resp. rate 18, height 5' 1 (1.549 m), weight 58 kg, SpO2 97%.  Medical Problem List and Plan: 1. 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 Awaiting approval of disability. Placement  pending. Hoping that hospital will provide LOG for placement. Code status  DNR -Continue CIR therapies including PT, OT, and SLP  -still waiting on SNF offers  2.  Impaired mobility: d/c lovenox  since ambulating >150 feet, SCDs ordered  3. Knee pain: Provided list of foods for pain. Tylenol  as needed, continue voltaren  gel- changed to prn. XR reviewed and is stable, discussed with therapy -Right wrist swelling: XR ordered and shows no acute fracture, shows 2.24mm ulnar variance, brace removed to given patient a break from it. Voltaren  gel ordered prn for discomfort, continue, CT ordered and show no acute osseus injury, continue ace wrap   -Foot pain: tramadol  ordered prn, improved today, continue prn  -Headaches:continue tylenol  and tramadol  prn -Low back pain: continue kpad, asked [place]  to please bring for her -Whole body hypersensitivity: gabapentin  100mg  TID ordered -Pain in left heel: XR obtained and negative, cam boot ordered to help offload heel with therapy, discussed  that this has helped -R shoulder pain: XR neg for acute etiology on 04/06/23, voltaren  gel ordered--encourage ROM as tolerated 1/3-5 denying any pain   4. History of anxiety: discussed with her mom that she feels this was a big risk factor for her accident, propanolol d/ced due to hypotension. Continue magnesium  supplement. Asked SW to place her on neuropsych list, Buspar  ordered prn, discussed placing outpatient referral for behavioral health f/u  5. Neuropsych/cognition: This patient is not capable of making decisions on her own behalf  - 10/12: Still not capable of medical decision making; BUT oriented and communicating, following directions - MUCH improved - PRN melatonin 3mg    for insomnia  - Buspar  prn  6. Skin: -Buttock/sacrum--foam dressing, pressure relief, nutrition -stage 3 left heel, continue foam dressing/ pressure relief  7. Fluids/Electrolytes/Nutrition: Routine in and outs with follow-up  chemistries  -Eating  well with cueing   -Dysphagia: continue D3/thins--tolerating this diet -Low protein stores: d/c prosource since patient hates these and is eating 100% of meals -Suboptimal vitamin D  level: increase to 5,000 U daily. continue this dose  -History of magnesium  deficiency: magnesium  500mg  stopped to support BP  - good po intake  8.  Cavitary right lower lobe pneumonia likely aspiration pneumonia/MRSA pneumonia.  Resolved    Latest Ref Rng & Units 05/16/2023    6:28 AM 05/02/2023    5:20 AM 04/18/2023    5:16 AM  CBC  WBC 4.0 - 10.5 K/uL 6.2  5.9  6.0   Hemoglobin 12.0 - 15.0 g/dL 85.0  85.1  85.9   Hematocrit 36.0 - 46.0 % 42.1  43.0  41.6   Platelets 150 - 400 K/uL 300  241  194      9.  History of drug abuse.  Positive cocaine on urine drug screen.  Provide counseling  10.  AKI/hypovolemia and ATN.  Resolved     Latest Ref Rng & Units 05/16/2023    6:28 AM 05/02/2023    5:20 AM 04/18/2023    5:16 AM  BMP  Glucose 70 - 99 mg/dL 87  89  79   BUN 6 - 20 mg/dL 15  14  17    Creatinine 0.44 - 1.00 mg/dL 9.24  9.20  9.32   Sodium 135 - 145 mmol/L 136  138  137   Potassium 3.5 - 5.1 mmol/L 3.9  4.0  3.8   Chloride 98 - 111 mmol/L 105  109  105   CO2 22 - 32 mmol/L 22  23  23    Calcium  8.9 - 10.3 mg/dL 8.9  8.9  9.0     11.  Mild transaminitis with rhabdomyolysis.  Both resolved  12.  Hypotension:  D/c amantadine  -12/28-29/24 BPs mostly stable, monitor 12/31-1/1 BP soft, but per therapy, not orthostatic 1/2-3- BP soft- 90s/50s- but not orthostatic Sx's per staff--careful with flomax  on board 1/4-5/25 BPs staying stable, cont monitoring Vitals:   05/16/23 1300 05/16/23 1921 05/17/23 0321 05/17/23 1257  BP: 111/75 105/65 95/75 104/76   05/17/23 1922 05/18/23 0503 05/18/23 1508 05/18/23 1914  BP: 102/64 91/61 106/68 102/64   05/19/23 0306 05/19/23 1329 05/19/23 1923 05/20/23 0358  BP: 101/60 105/67 104/75 105/73    13. HTN: resolved   14. Impaired  initiation: resolved, d/c amantadine   15. Hyperglycemia: CBGs mildly elevated, d/ced checks. Prosource d/ced as per patient's request  16. Spasticity:  Continue wrist, elbow brace, and PRAFOs. Ordered a night splint for LLE  D/c baclofen  and tizanidine - much improved  17.  Dyskinetic movements in head and neck - appears to be myoclonic jerks related to hypoxic brain injury - as discussed with pt mom, no specific meds for this on antispasticity meds , consioder benzo although with pt's SUD would avoid this class - EEG reviewed and was negative for seizure.  -  resolved   18. Cervical extensor weakness: decrease tizanidine  to 2mg  HS, decrease baclofen  to 5mg  QHS, improved, d/c'd  19. Bowel and bladder incontinence: continue bowel and bladder program. D/c miralax , decrease tizanidine  to 2mg  HS, d/c'd  12/30- LBM 2 days ago- was large- co abd pain- will get KUB- if full of stool- will need to be cleaned out.     1/3--appears to be voiding better with addition of flomax . No dizziness reported but will need to be careful with soft bp's   -we ned to scan for  a few pvr's to see how much she's emptying       -last bm 1/3 -05/19/23 BM today, PVRs low, cont regimen but don't need to cont checking PVRs at this point.  -05/20/23 BM yesterday continent, mixed continence/incontinence with urination, cont timed toileting.   20. Fatigue: resolved, discussed with team may be secondary to anti-spasticity medications, B12 reviewed and is 335, in suboptimal range, will give 1,000U B12 injection 8/7, will order monthly as needed, metanx started, increase to BID, continue, d/c tizanidine , discussed with patient, d/c baclofen    21. MRSA nares positive: negative on repeat, precautions d/ced  22. Tachycardic: resolved, d/c propanolol. Continue tizanidine  2mg  HS. Resolved, amantadine  increased back to 100mg  BID, continue this dose, resolved  23. S/p fall: CT head reviewed and is stable  24. Bilateral foot drop:  bilateral AFOs ordered, discussed that these can help her to walk better  25. Vaginal itching: completed fluconazole . Having some discharge now. See below  26. Dysuria -treated twice for UTI's.   12/30- added Pyridium  100 mg TID x 2 days- to help dysuria. 12/31- no change in Dysuria- per nursing, has had since before Xmas- might be tear in mons/vaginal mucosa that's stinging when voids-   1/1- pt reports Burning gone is good- is done with ABX and pyridium - pt hates pyridium - freaks her out with color change.   1/3 see neurogenic bladder discussion above  27. Poor sitting balance: continue OT/PT, improving  28. Headaches: topamax  74m daily prn ordered, continue  29. Drowsiness with tramadol : d/ced  30. IUD in place: Pt states she has IUD x30yrs  and believes it's supposed to be removed soon(chart review- Dr. Lavoie placed Mirena  IUD on 07/01/19, good for 7yrs); needs outpatient follow-up with OB/GYN  31. Constipation: d/c baclofen , last BM 12/18, milk of magnesia ordered 12/20, LBM 12/28, continue magnesium  gluconate -12/30- LBM 12/28- but c/o abd pain- get KUB to see if needs to give Sorbitol .   -05/20/23 LBM yesterday, cont regimen  32. History of drug addiction: tramadol  d/ced as per mom's preference  33. Depression: discussed scheduling for outpatient psychology/psychiatry follow-up, discussed neuropsych f/u while in hospital  -appreciate Dr. Corina follow up  34. Sinusitis: claritin  ordered prn, continue      LOS: 183 days A FACE TO FACE EVALUATION WAS PERFORMED  [address] 05/20/2023, 10:59 AM

## 2023-05-21 NOTE — Progress Notes (Signed)
 Physical Therapy Session Note  Patient Details  Name: Nicole [name] MRN: [id number] Date of Birth: Nov 11, 1975  Today's Date: 05/21/2023 PT Individual Time: 9054-8955 PT Individual Time Calculation (min): 59 min   Short Term Goals: Week 1:  PT Short Term Goal 1 (Week 1): Pt will perform supine<>sit with supervision PT Short Term Goal 1 - Progress (Week 1): Other (comment) (new goal) PT Short Term Goal 2 (Week 1): Pt will ambulate at least 13ft with no more than min A, not using an AD. PT Short Term Goal 2 - Progress (Week 1): Other (comment) (new goal) PT Short Term Goal 3 (Week 1): Pt will demonstrate decreased fall risk as noted by an improvement of at least 7 points on the [employer] Test PT Short Term Goal 3 - Progress (Week 1): Other (comment) (new goal) PT Short Term Goal 4 (Week 1): Pt will navigate 4 steps using HRs with no more than CGA  Skilled Therapeutic Interventions/Progress Updates:      Pt sitting in wheelchair to start - has no c/o pain. Dressed and ready for therapy.   Sit<>stand to RW with CGA. Ambulated from her room to main rehab gym, ~137ft, with CGA and RW. Pt requesting to go outside to see the snow from yesterday. Ambulated from CIR floor to [facility] lobby (standing rest break) with CGA and RW - decreased safety awareness while managing elevator - attempting to ambulate backwards off of it instead of forward facing. Ambulated outdoors for short distance (cold weather) with CGA and RW - safety cues for managing throw rugs, thresholds, and change in terrain. Returned upstairs to [facility] floor. While seated EOM - worked some on thoracic rotation, pelvic mobility, and lumbar flexibility - seated twists, seated cat/cow's, and seated tree hugs. Also provided PROM for chest openers to stretch pec's and thoracic extension. In supine position, worked on core strengthening with crunch's, sit ups, leg lifts, and dead bug holds. Assisted her to sitting on large physioball to work on  sitting balance and core strengthening - minA for sitting balance and challenged her with chop/lifts while seated on physioball using 3lb small med ball - difficulty with AROM in these quadrants and tended to just lift overhead instead. Ambulated back to her room with CGA and RW and ended session seated in wheelchair with all her needs met. Chair pad alarm on.   Therapy Documentation Precautions:  Precautions Precautions: Fall Precaution Comments: decreased stand balance- reliant on UE, Pain in LLE limits mobility at times Required Braces or Orthoses: Other Brace Other Brace: B adjustable night splints; B elbow cosey splints; L palm protector; R mitt Restrictions Weight Bearing Restrictions Per Provider Order: No General:      Therapy/Group: Individual Therapy  Nicole [name] 05/21/2023, 7:44 AM

## 2023-05-21 NOTE — Progress Notes (Signed)
 Speech Language Pathology Daily Session Note  Patient Details  Name: Nicole [name] MRN: [id number] Date of Birth: 25-Sep-1975  Today's Date: 05/21/2023 SLP Individual Time: 0201-0304 SLP Individual Time Calculation (min): 63 min  Short Term Goals: Week 9: SLP Short Term Goal 1 (Week 9): Week 26 - Pt will solve mildly complex environmental problems with 70% accuracy given min assist. SLP Short Term Goal 2 (Week 9): Week 26 - Pt will recall basic daily information using external aids with 90% accuracy given min assist. SLP Short Term Goal 3 (Week 9): Week 26 - Pt will utilize recommended handwriting strategies to achieve 90% legebility given min verbal cues in 4/5 opportunities at the phrase and sentence levels. SLP Short Term Goal 4 (Week 9): Week 26 - Pt will utilize increased vocal intensity w/ min verbal cues to remain 90% intelligibile at conversation level in noisy environments in 4/5 opportunities.  Skilled Therapeutic Interventions:  Patient was seen in PM to address cognitive re- training and speech intelligibility. Pt was alert and seated upright in WC upon SLP arrival. She was agreeable for transfer to [facility] in order to complete session. Given a mild to moderately distracting and noisy environment pt was ~ 90% intelligible in phrases, sentences and conversation. Occasionally, pt presented with decreased vocal intensity at the end of a phrase or sentence with poor awareness. Given cues for clarification, pt able to repeat with improved intensity achieving 100% intelligibility. While addressing recall of daily information, pt recalled previous therapy sessions, activities, breakfast and lunch meal indep. In other minutes of session, SLP addressed problem solving through sequencing of 3 steps presented visually. Pt warranting sup to min A in order to achieve 100% intelligibility. SLP further addressed problem solving through scheduling task with pt completing task with overall supervision to min A.  Pt transitioned back to her room at conclusion of session, She reported need to go to the bathroom however; upon arrival, pt discovered she had already been incontinent of bladder. SLP assisted in doffing lower body clothing and donning again. Assigned NT arrived and took over care. SLP to continue POC.   Pain Pain Assessment Pain Scale: 0-10 Pain Score: 0-No pain  Therapy/Group: Individual Therapy  Nicole [name] 05/21/2023, 3:20 PM

## 2023-05-21 NOTE — Plan of Care (Signed)
  Problem: Consults Goal: RH STROKE PATIENT EDUCATION Description: See Patient Education module for education specifics  Outcome: Progressing   Problem: RH BOWEL ELIMINATION Goal: RH STG MANAGE BOWEL WITH ASSISTANCE Description: STG Manage Bowel with mod I Assistance. Outcome: Progressing Goal: RH STG MANAGE BOWEL W/MEDICATION W/ASSISTANCE Description: STG Manage Bowel with Medication with mod I Assistance. Outcome: Progressing   Problem: RH BLADDER ELIMINATION Goal: RH STG MANAGE BLADDER WITH ASSISTANCE Description: STG Manage Bladder With toileting Assistance Outcome: Progressing   Problem: RH SKIN INTEGRITY Goal: RH STG MAINTAIN SKIN INTEGRITY WITH ASSISTANCE Description: STG Maintain Skin Integrity With min  Assistance. Outcome: Progressing

## 2023-05-21 NOTE — Progress Notes (Signed)
 Occupational Therapy Session Note  Patient Details  Name: Nicole [name] MRN: [id number] Date of Birth: 1975-09-30  Today's Date: 05/21/2023 OT Individual Time: 9194-9084 OT Individual Time Calculation (min): 70 min    Short Term Goals: Week 5:  OT Short Term Goal 1 (Week 5): Patient will grab hair to place in ponytail with BUE OT Short Term Goal 1 - Progress (Week 5): Other (comment) OT Short Term Goal 2 (Week 5): Patient will trial wearing underwear during weekday x 2 OT Short Term Goal 2 - Progress (Week 5): Other (comment) OT Short Term Goal 3 (Week 5): Patient will complete laundry with min assist. OT Short Term Goal 3 - Progress (Week 5): Other (comment) OT Short Term Goal 4 (Week 5): Patient will prepare a simple snack while walking in the kitchen OT Short Term Goal 4 - Progress (Week 5): Other (comment) OT Short Term Goal 5 (Week 5): xxx OT Short Term Goal 5 - Progress (Week 5): Other (comment)  Skilled Therapeutic Interventions/Progress Updates:   Patient received supine in bed - eager to get up and to the bathroom - continent void on toilet.  Patient walking with walker with close supervision but needs cueing to min assist for navigation onto toilet, shower seat.  Patient showered with close supervision and verbal cueing for safety and thoroughness.  Dressed self with min assist to don bra, don pants and left shoe.  Patient very focused on hair care - needs cues for logical management of self care - e.g. - put hair up after putting overhead shirt on, etc.  Patient able to stand at sink to brush teeth, and very excited that this allowed her to keep her shirt clean.  Patient completed dressing and grooming then indicated that she wet her brief and needed to be changed.  Patient was not aware until she was voiding.   Left up in wheelchair with chair pad in place and alarm engaged, personal items/ call bell/ phone in reach.    Therapy Documentation Precautions:  Precautions Precautions:  Fall Precaution Comments: decreased stand balance- reliant on UE, Pain in LLE limits mobility at times Required Braces or Orthoses: Other Brace Other Brace: B adjustable night splints; B elbow cosey splints; L palm protector; R mitt Restrictions Weight Bearing Restrictions Per Provider Order: No   Pain:  Denies pain    Therapy/Group: Individual Therapy  [name] M 05/21/2023, 12:22 PM

## 2023-05-21 NOTE — Progress Notes (Signed)
 PROGRESS NOTE   Subjective/Complaints: No new complaints this morning Would like to be changed to FULL CODE, explained DNR and DNI and she does not want these  ROS: as per HPI. Denies CP, SOB, abd pain, N/V/D/C, or any other complaints at this time.      Objective:   No results found.   No results for input(s): WBC, HGB, HCT, PLT in the last 72 hours.         No results for input(s): NA, K, CL, CO2, GLUCOSE, BUN, CREATININE, CALCIUM  in the last 72 hours.          Intake/Output Summary (Last 24 hours) at 05/21/2023 1046 Last data filed at 05/21/2023 0700 Gross per 24 hour  Intake 597 ml  Output --  Net 597 ml           Physical Exam: Vital Signs Blood pressure 94/64, pulse 75, temperature 98.2 F (36.8 C), temperature source Oral, resp. rate 18, height 5' 1 (1.549 m), weight 58 kg, SpO2 97%.        Constitutional: No distress . Vital signs reviewed. Resting in bed HEENT: NCAT, EOMI, oral membranes moist Neck: supple Cardiovascular: RRR without murmur. No JVD    Respiratory/Chest: CTA Bilaterally without wheezes or rales. Normal effort    GI/Abdomen: BS +, non-tender, non-distended Ext: no clubbing, cyanosis, or edema Psych: flat but cooperative  Skin: + Scaling dried skin on bilateral heels left buttock area no examined as she was up and dressed + Left heel stage III DTI has resolved Neuro: memory improving. Remembers staff and myself by name! Neurologic: AAOx2, fair insight and awareness.[name]  Phonation and speech intelligability much improved. Fair insight. MAS 1 right elbow,MAS  tr to 1 in RIght finger and wrist flexors  MAS 1 in extensors of LE's MAS 1 L elbow , MAS 1+ in L wrist and finger flexors. Hypersensitivity to left hand improved, Left sided strength improved, decreased range of motion and pain at end range of motion in left shoulder, stable  1/6  Musculoskeletal:some limitations in right shoulder ROM     Assessment/Plan: 1. Functional deficits which require 3+ hours per day of interdisciplinary therapy in a comprehensive inpatient rehab setting. Physiatrist is providing close team supervision and 24 hour management of active medical problems listed below. Physiatrist and rehab team continue to assess barriers to discharge/monitor patient progress toward functional and medical goals  Care Tool:  Bathing    Body parts bathed by patient: Face, Abdomen, Chest, Left upper leg, Right upper leg, Left arm, Front perineal area, Right arm, Right lower leg, Left lower leg, Buttocks   Body parts bathed by helper: Buttocks Body parts n/a: Right arm, Left arm, Front perineal area, Buttocks, Right upper leg, Left upper leg, Right lower leg, Left lower leg   Bathing assist Assist Level: Supervision/Verbal cueing     Upper Body Dressing/Undressing Upper body dressing   What is the patient wearing?: Pull over shirt    Upper body assist Assist Level: Set up assist    Lower Body Dressing/Undressing Lower body dressing      What is the patient wearing?: Underwear/pull up, Pants     Lower body  assist Assist for lower body dressing: Minimal Assistance - Patient > 75%     Toileting Toileting Toileting Activity did not occur Press Photographer and hygiene only): N/A (no void or bm)  Toileting assist Assist for toileting: Supervision/Verbal cueing     Transfers Chair/bed transfer  Transfers assist  Chair/bed transfer activity did not occur: Safety/medical concerns  Chair/bed transfer assist level: Contact Guard/Touching assist Chair/bed transfer assistive device: Walker, Armrests   Locomotion Ambulation   Ambulation assist   Ambulation activity did not occur: Safety/medical concerns  Assist level: Contact Guard/Touching assist Assistive device: Walker-rolling Max distance: 150   Walk 10 feet  activity   Assist  Walk 10 feet activity did not occur: Safety/medical concerns  Assist level: Contact Guard/Touching assist Assistive device: Walker-rolling   Walk 50 feet activity   Assist Walk 50 feet with 2 turns activity did not occur: Safety/medical concerns  Assist level: Contact Guard/Touching assist Assistive device: Walker-rolling    Walk 150 feet activity   Assist Walk 150 feet activity did not occur: Safety/medical concerns  Assist level: Contact Guard/Touching assist Assistive device: Walker-rolling    Walk 10 feet on uneven surface  activity   Assist Walk 10 feet on uneven surfaces activity did not occur: Safety/medical concerns   Assist level: Minimal Assistance - Patient > 75% Assistive device: Walker-rolling   Wheelchair     Assist Is the patient using a wheelchair?: No Type of Wheelchair: Manual    Wheelchair assist level: Moderate Assistance - Patient 50 - 74% Max wheelchair distance: 33ft    Wheelchair 50 feet with 2 turns activity    Assist    Wheelchair 50 feet with 2 turns activity did not occur: Safety/medical concerns   Assist Level: Supervision/Verbal cueing   Wheelchair 150 feet activity     Assist  Wheelchair 150 feet activity did not occur: Safety/medical concerns   Assist Level: Moderate Assistance - Patient 50 - 74%   Blood pressure 94/64, pulse 75, temperature 98.2 F (36.8 C), temperature source Oral, resp. rate 18, height 5' 1 (1.549 m), weight 58 kg, SpO2 97%.  Medical Problem List and Plan: 1. 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 Awaiting approval of disability.  Placement pending. Hoping that hospital will provide LOG for placement. Code status changed to full code after obtaining patient consent -Continue CIR therapies including PT, OT, and SLP  -still waiting on SNF offers  2.  Impaired mobility: d/c lovenox  since ambulating >150 feet, continue SCDs  3. Knee pain: Provided list of foods for pain. Tylenol  as needed, d/c voltaren  gel. XR reviewed and is stable, discussed with therapy -Right wrist swelling: XR ordered and shows no acute fracture, shows 2.54mm ulnar variance, brace removed to given patient a break from it. Voltaren  gel ordered prn for discomfort, continue, CT ordered and show no acute osseus injury, continue ace wrap   -Foot pain: tramadol  ordered prn, improved today, continue prn  -Headaches:continue tylenol  and tramadol  prn -Low back pain: continue kpad, asked [place]  to please bring for her -Whole body hypersensitivity: gabapentin  100mg  TID ordered -Pain in left heel: XR obtained and negative, cam boot ordered to help offload heel with therapy,  discussed that this has helped -R shoulder pain: XR neg for acute etiology on 04/06/23, voltaren  gel ordered--encourage ROM as tolerated 1/3-5 denying any pain   4. History of anxiety: discussed with her mom that she feels this was a big risk factor for her accident, propanolol d/ced due to hypotension. Continue magnesium  supplement. Asked SW to place her on neuropsych list, Buspar  ordered prn, discussed placing outpatient referral for behavioral health f/u  5. Neuropsych/cognition: This patient is not capable of making decisions on her own behalf  - 10/12: Still not capable of medical decision making; BUT oriented and communicating, following directions - MUCH improved - PRN melatonin 3mg    for insomnia  - Buspar  prn  6. Skin: -Buttock/sacrum--foam dressing, pressure relief, nutrition -stage 3 left heel, continue foam dressing/ pressure relief  7. Fluids/Electrolytes/Nutrition: Routine in  and outs with follow-up chemistries  -Eating  well with cueing   -Dysphagia: continue D3/thins--tolerating this diet -Low protein stores: d/c prosource since patient hates these and is eating 100% of meals -Suboptimal vitamin D  level: increase to 5,000 U daily. continue this dose  -History of magnesium  deficiency: magnesium  500mg  stopped to support BP  - good po intake  8.  Cavitary right lower lobe pneumonia likely aspiration pneumonia/MRSA pneumonia.  Resolved    Latest Ref Rng & Units 05/16/2023    6:28 AM 05/02/2023    5:20 AM 04/18/2023    5:16 AM  CBC  WBC 4.0 - 10.5 K/uL 6.2  5.9  6.0   Hemoglobin 12.0 - 15.0 g/dL 85.0  85.1  85.9   Hematocrit 36.0 - 46.0 % 42.1  43.0  41.6   Platelets 150 - 400 K/uL 300  241  194      9.  History of drug abuse.  Positive cocaine on urine drug screen.  Provide counseling  10.  AKI/hypovolemia and ATN.  Resolved     Latest Ref Rng & Units 05/16/2023    6:28 AM 05/02/2023    5:20 AM 04/18/2023    5:16 AM  BMP  Glucose 70 - 99 mg/dL 87  89  79   BUN 6 - 20 mg/dL 15  14  17    Creatinine 0.44 - 1.00 mg/dL 9.24  9.20  9.32   Sodium 135 - 145 mmol/L 136  138  137   Potassium 3.5 - 5.1 mmol/L 3.9  4.0  3.8   Chloride 98 - 111 mmol/L 105  109  105   CO2 22 - 32 mmol/L 22  23  23    Calcium  8.9 - 10.3 mg/dL 8.9  8.9  9.0     11.  Mild transaminitis with rhabdomyolysis.  Both resolved  12.  Hypotension:  D/c amantadine  -12/28-29/24 BPs mostly stable, monitor 12/31-1/1 BP soft, but per therapy, not orthostatic 1/2-3- BP soft- 90s/50s- but not orthostatic Sx's per staff--careful with flomax  on board 1/4-5/25 BPs staying stable, cont monitoring Vitals:   05/17/23 1257 05/17/23 1922 05/18/23 0503 05/18/23 1508  BP: 104/76 102/64 91/61 106/68   05/18/23 1914 05/19/23 0306 05/19/23 1329 05/19/23 1923  BP: 102/64 101/60 105/67 104/75   05/20/23 0358 05/20/23 1326 05/20/23 1936 05/21/23 0507  BP: 105/73 102/70 104/70 94/64    13. HTN:  resolved   14. Impaired initiation: resolved, d/c amantadine   15. Hyperglycemia: CBGs mildly elevated, d/ced checks. Prosource d/ced as per patient's request  16. Spasticity:  Continue wrist, elbow brace, and PRAFOs. Ordered a night splint for LLE  D/c baclofen  and tizanidine - much improved  17.  Dyskinetic movements in head and neck - appears to be myoclonic jerks related to hypoxic brain injury - as discussed with pt mom, no specific meds for this on antispasticity meds , consioder benzo although with pt's SUD would avoid this class - EEG reviewed and was negative for seizure.  -  resolved   18. Cervical extensor weakness: decrease tizanidine  to 2mg  HS, decrease baclofen  to 5mg  QHS, improved, d/c'd  19. Bowel and bladder incontinence: continue bowel and bladder program. D/c miralax , decrease tizanidine  to 2mg  HS, d/c'd  12/30- LBM 2 days ago- was large- co abd pain- will get KUB- if full of stool- will need to be cleaned out.     1/3--appears to be voiding better with addition of flomax . No dizziness reported but will need to be careful with soft bp's   -we ned to scan for  a few pvr's to see how much she's emptying       -last bm 1/3 -05/19/23 BM today, PVRs low, cont regimen but don't need to cont checking PVRs at this point.  -05/20/23 BM yesterday continent, mixed continence/incontinence with urination, cont timed toileting.   20. Fatigue: resolved, discussed with team may be secondary to anti-spasticity medications, B12 reviewed and is 335, in suboptimal range, will give 1,000U B12 injection 8/7, will order monthly as needed, metanx started, increase to BID, continue, d/c tizanidine , discussed with patient, d/c baclofen    21. MRSA nares positive: negative on repeat, precautions d/ced  22. Tachycardic: resolved, d/c propanolol. Continue tizanidine  2mg  HS. Resolved, amantadine  increased back to 100mg  BID, continue this dose, resolved  23. S/p fall: CT head reviewed and is  stable  24. Bilateral foot drop: bilateral AFOs ordered, discussed that these can help her to walk better  25. Vaginal itching: completed fluconazole . Having some discharge now. See below  26. Dysuria -treated twice for UTI's.   12/30- added Pyridium  100 mg TID x 2 days- to help dysuria. 12/31- no change in Dysuria- per nursing, has had since before Xmas- might be tear in mons/vaginal mucosa that's stinging when voids-   1/1- pt reports Burning gone is good- is done with ABX and pyridium - pt hates pyridium - freaks her out with color change.   1/3 see neurogenic bladder discussion above  27. Poor sitting balance: continue OT/PT, improving  28. Headaches: topamax  74m daily prn ordered, continue  29. Drowsiness with tramadol : d/ced  30. IUD in place: Pt states she has IUD x19yrs  and believes it's supposed to be removed soon(chart review- Dr. Lavoie placed Mirena  IUD on 07/01/19, good for 74yrs); needs outpatient follow-up with OB/GYN  31. Constipation: d/c baclofen , last BM 12/18, milk of magnesia ordered 12/20, LBM 12/28, continue magnesium  gluconate -12/30- LBM 12/28- but c/o abd pain- get KUB to see if needs to give Sorbitol .   -05/20/23 LBM yesterday, cont regimen  32. History of drug addiction: tramadol  d/ced as per mom's preference  33. Depression: discussed scheduling for outpatient psychology/psychiatry follow-up, discussed neuropsych f/u while in hospital  -appreciate Dr. Corina follow up  34. Sinusitis: claritin  ordered prn, continue     LOS: 184 days A FACE TO FACE EVALUATION WAS PERFORMED  Sven P Chaselynn Kepple 05/21/2023, 10:46 AM

## 2023-05-22 MED ORDER — GABAPENTIN 100 MG PO CAPS
100.0000 mg | ORAL_CAPSULE | Freq: Two times a day (BID) | ORAL | Status: DC
  Administered 2023-05-22 – 2023-05-23 (×2): 100 mg via ORAL
  Filled 2023-05-22 (×2): qty 1

## 2023-05-22 MED ORDER — CAMPHOR-MENTHOL 0.5-0.5 % EX LOTN
TOPICAL_LOTION | Freq: Every day | CUTANEOUS | Status: DC
  Filled 2023-05-22: qty 222

## 2023-05-22 NOTE — Progress Notes (Signed)
 Speech Language Pathology Daily Session Note  Patient Details  Name: Nicole [name] MRN: [id number] Date of Birth: 03/04/1976  Today's Date: 05/22/2023 SLP Individual Time: 8669-8567 SLP Individual Time Calculation (min): 62 min  Short Term Goals: Week 9: SLP Short Term Goal 1 (Week 9): Week 26 - Pt will solve mildly complex environmental problems with 70% accuracy given min assist. SLP Short Term Goal 2 (Week 9): Week 26 - Pt will recall basic daily information using external aids with 90% accuracy given min assist. SLP Short Term Goal 3 (Week 9): Week 26 - Pt will utilize recommended handwriting strategies to achieve 90% legebility given min verbal cues in 4/5 opportunities at the phrase and sentence levels. SLP Short Term Goal 4 (Week 9): Week 26 - Pt will utilize increased vocal intensity w/ min verbal cues to remain 90% intelligibile at conversation level in noisy environments in 4/5 opportunities.  Skilled Therapeutic Interventions:  Patient was seen in PM to address cognitive re- training. Pt was alert and seated upright in WC upon SLP arrival. She was agreeable for SLP session and transported to atrium for session. Pt given a $100 budget and challenged to spend as close to target without going over in the hospital gift shop. Pt required min A to sustain attention throughout task. She completed budget with overall min A. In additional minutes of session, pt's vocal intensity was challenged in a noisy environment. Pt with improved adaptability to noise in comparison to previous sessions with ability to increase vocal intensity to achieve 100% intelligibility in conversation with supervision A. Despite success while talking to clinician in noisy environment, pt with less success talking to an unfamiliar listener in the gift shop. She required sup to min A for vocal intensity while talking with sales associate. In other minutes of session, SLP addressed problem solving through structured task. Pt  challenged to use inference and reasoning skills through forecast interpretation presented verbally. Pt completed task with 89% acc indep which did not improve with additional cues. Pt transported back to her room where she reported incontinence of bladder. NT in to assist as needed. SLP to continue POC.   Pain Pain Assessment Pain Scale: 0-10 Pain Score: 0-No pain  Therapy/Group: Individual Therapy  [name] 05/22/2023, 3:09 PM

## 2023-05-22 NOTE — Progress Notes (Signed)
 Nutrition Follow-up  DOCUMENTATION CODES:   Not applicable  INTERVENTION:  Continue with current DYS 3 diet Continue with Ensure Plus High Protein po BID, each supplement provides 350 kcal and 20 grams of protein.  Nutrition goals appear to be met will sign off. * please re- consult for any new dietary concerns*  NUTRITION DIAGNOSIS:   Increased nutrient needs related to acute illness, other (see comment), wound healing (prolonged hospitalization, previously severe malnutrition) as evidenced by estimated needs.    GOAL:   Patient will meet greater than or equal to 90% of their needs    MONITOR:   PO intake, Labs, Supplement acceptance, Weight trends, Skin  REASON FOR ASSESSMENT:   Malnutrition Screening Tool    ASSESSMENT:   48 y.o. 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 PMH includes: anxiety, asthma, depression, drug abuse.  Patient in room at time of visit.  She stated that her appetite is good 50-100% of meals being eaten.  She is still drinking and enjoying the Ensures. She has no dietary concerns at this time.  Patient appears to be at nutrition goal and suspect that nutrition can sign off on her.  Hospital weight history: Date/Time Weight Weight in lbs BSA (Calculated - sq m) BMI (Calculated) Who  05/19/23 1535 58 kg 127.87 lbs -- -- TP  05/14/23 1053 50.5 kg 111.33 lbs -- -- MN  05/09/23 1011 114 kg 251.32 lbs -- -- LF  04/30/23 0631 52.5 kg 115.74 lbs -- -- MG  04/18/23 0845 53.6 kg 118.17 lbs -- -- LF  04/13/23 0746 54.3 kg 119.71 lbs -- -- FV  03/26/23 1653 51.8 kg 114.2 lbs -- -- AM  03/21/23 1100 48.8 kg 107.58 lbs -- -- MM  02/17/23 1418 46.1 kg 101.63 lbs -- -- JR  02/14/23 0900 45.8 kg 100.97 lbs -- -- MM  02/07/23 0800 47.6 kg 104.94 lbs -- -- MM  01/27/23 1535 48 kg 105.82 lbs -- -- TP  01/11/23 1438 45.6 kg 100.53 lbs -- -- HH  01/03/23 1536 44.2 kg  97.44 lbs -- -- RE  12/20/22 1100 44.8 kg 98.77 lbs -- -- KK  12/13/22 1807 47.9 kg 105.6 lbs -- 19.96 WN  12/02/22 1535 47.4 kg 104.5 lbs         Average Meal Intake: 50-100: 79% intake x 8  recorded meals  Nutritionally Relevant Medications: Scheduled Meds:  B-complex with vitamin C   1 tablet Oral Daily   gabapentin   100 mg Oral TID   magnesium  gluconate  500 mg Oral QHS     PRN Meds:.acetaminophen , bisacodyl , bisacodyl , busPIRone , camphor-menthol , gadobutrol , loratadine , melatonin, selenium  sulfide, topiramate   Labs Reviewed 05/16/23    NUTRITION - FOCUSED PHYSICAL EXAM:  Flowsheet Row Most Recent Value  Orbital Region No depletion  Upper Arm Region No depletion  Thoracic and Lumbar Region No depletion  Buccal Region No depletion  Temple Region Mild depletion  Clavicle Bone Region Mild depletion  Clavicle and Acromion Bone Region Mild depletion  Scapular Bone Region Mild depletion  Dorsal Hand Mild depletion  Patellar Region Mild depletion  Anterior Thigh Region Mild depletion  Posterior Calf Region Mild depletion  Edema (RD Assessment) None  Hair Reviewed  [hair loss on crown during hospitalization, new growth observed.]  Eyes Reviewed  Mouth Reviewed  Skin Reviewed  Nails Reviewed       Diet Order:   Diet Order  DIET DYS 3 Room service appropriate? Yes with Assist; Fluid consistency: Thin  Diet effective now                   EDUCATION NEEDS:   Not appropriate for education at this time  Skin:  Skin Assessment: Skin Integrity Issues: Skin Integrity Issues:: Other (Comment) DTI: left heel, dressing intact Stage III: Left heel Unstageable: Left buttocks Other: erythema/redness to sacrum and heel  Last BM:  1/7 type 4  Height:   Ht Readings from Last 1 Encounters:  11/18/22 5' 1 (1.549 m)    Weight:   Wt Readings from Last 1 Encounters:  05/19/23 58 kg    Ideal Body Weight:  47.7 kg  BMI:  Body mass index is 24.16  kg/m.  Estimated Nutritional Needs:   Kcal:  1750-2050 kcal  Protein:  80-90 g  Fluid:  77ml/kcal    Jenna Pew RDN, LDN Clinical Dietitian   If unable to reach, please contact RD Inpatient secure chat group between 8 am-4 pm daily

## 2023-05-22 NOTE — Progress Notes (Addendum)
 Patient ID: Nicole [name], female   DOB: [birth year], 48 y.o.   MRN: [id number]  Have reached out to [facility] to see if would consider pt for admission since has three medicaid beds. Have left message await return call. Also reached out to [facility] to check on disability  1;51 PM Spoke with [facility] who has given the information to Jamie to come and do an on-sight evaluation. Will await Jamie's call.

## 2023-05-22 NOTE — Progress Notes (Signed)
 Physical Therapy Session Note  Patient Details  Name: Nicole [name] MRN: [id number] Date of Birth: 08/13/1975  Today's Date: 05/22/2023 PT Individual Time: 1115 - 1200 + 1455-1530 PT Individual Time Calculation (min): 45 min + 35 min    Short Term Goals: Week 1:  PT Short Term Goal 1 (Week 1): Pt will perform supine<>sit with supervision PT Short Term Goal 1 - Progress (Week 1): Other (comment) (new goal) PT Short Term Goal 2 (Week 1): Pt will ambulate at least 110ft with no more than min A, not using an AD. PT Short Term Goal 2 - Progress (Week 1): Other (comment) (new goal) PT Short Term Goal 3 (Week 1): Pt will demonstrate decreased fall risk as noted by an improvement of at least 7 points on the [employer] Test PT Short Term Goal 3 - Progress (Week 1): Other (comment) (new goal) PT Short Term Goal 4 (Week 1): Pt will navigate 4 steps using HRs with no more than CGA  Skilled Therapeutic Interventions/Progress Updates:      1st session: Pt sitting in wheelchair to start. No reports of pain. Sit<>stand to RW with CGA and safety cues for ensuring wheelchair brakes are locked before standing.   Ambulated to main rehab gym, ~137ft, with CGA and RW. Worked on dynamic standing balance, hip/ankle strategies, and stepping strategies for balance recovery. Pt positioned in MaxiSky harness and played slow pitch baseball and pass with hockey sticks. Pt also practiced fast walking without UE support with minA and then static standing on compliant surfaces for x30 seconds with CGA. Pt enjoyed all activities and seated rest breaks were provided as needed. Pt with delayed stepping strategies overall, relies on harness maxisky for support and balance recovery. However she did have a few instances of sufficient steps with minor LOB's.   Pt returned to her room at conclusion of session - left sitting in wheelchair with all needs met.    2nd session: Pt sitting in wheelchair and ready for therapy - she  believed she was late for therapy but PT on time.   Sit<>Stand to RW with CGA with cues for safety to make sure wheelchair brakes are locked before standing. Ambulated with CGA and RW from her room to day room rehab gym, `160ft. Completed dynamic standing balance therapeutic task while playing Wii in unsuported standing position. She participated in games of bowling and also using the balance board to to challenge her hip/ankle strategies. Pt needing light minA overall for dynamic standing balance in supported position. She needed mod instructional cues fading to min question cues for playing Wii Bowling.   She ambulated back to her room at conclusion of session and was left sitting upright with all her needs met, chair pad alarm on.    Therapy Documentation Precautions:  Precautions Precautions: Fall Precaution Comments: decreased stand balance- reliant on UE, Pain in LLE limits mobility at times Required Braces or Orthoses: Other Brace Other Brace: B adjustable night splints; B elbow cosey splints; L palm protector; R mitt Restrictions Weight Bearing Restrictions Per Provider Order: No General:    Therapy/Group: Individual Therapy  [name] 05/22/2023, 7:46 AM

## 2023-05-22 NOTE — Progress Notes (Signed)
 Occupational Therapy Session Note  Patient Details  Name: Nicole [name] MRN: [id number] Date of Birth: 1975-07-14  Today's Date: 05/22/2023 OT Individual Time: 9199-9140 OT Individual Time Calculation (min): 59 min    Short Term Goals: Week 5:  OT Short Term Goal 1 (Week 5): Patient will grab hair to place in ponytail with BUE OT Short Term Goal 1 - Progress (Week 5): Other (comment) OT Short Term Goal 2 (Week 5): Patient will trial wearing underwear during weekday x 2 OT Short Term Goal 2 - Progress (Week 5): Other (comment) OT Short Term Goal 3 (Week 5): Patient will complete laundry with min assist. OT Short Term Goal 3 - Progress (Week 5): Other (comment) OT Short Term Goal 4 (Week 5): Patient will prepare a simple snack while walking in the kitchen OT Short Term Goal 4 - Progress (Week 5): Other (comment) OT Short Term Goal 5 (Week 5): xxx OT Short Term Goal 5 - Progress (Week 5): Other (comment)  Skilled Therapeutic Interventions/Progress Updates:     Pt received sitting EOB with nursing staff present in room. Pt presenting to be in good spirits receptive to skilled OT session reporting 0/10 pain- OT offering intermittent rest breaks, repositioning, and therapeutic support to optimize participation in therapy session. Pt requesting shower this AM. Engaged Pt in completing functional mobility to BR using RW with close supervision required for safety and CGA provided when crossing over threshold. Pt sat on toilet to doff clothing with CGA, min verbal cues to initiate task. Ambulatory transfer into shower using RW and grab bars CGA. Pt able to complete UB bathing while seated with close supervision and min verbal cues for sequencing. Pt washed hair with supervision with improved B UE use noted while managing shampoo and conditioner containers and while scrubbing her hair this session- OT assisted with managing shower head. Pt washed anterior peri-areas and upper portion of legs seated with  supervision and stood to wash bottom while holing grab bars CGA- assistance required to ensure cleanliness. Pt completed functional mobility back to room CGA using RW and sat EOB for U/LB dressing tasks. Pt donned shirt with min A d/t L UE positioning and shirt getting caught on her elbow/shoulder. Pt weaved B LEs into pants with mod verbal cues required for orientation of pants and stood to bring pants to waist with CGA!! Pt able to donn R sock/shoe with supervision sitting EOB- Pt attempted to donn L sock with assistance required to donn sock over her toes and Pt able to adjust sock over heel of foot. OT assisted with stabilizing L shoe while Pt pushed foot into shoe. Worked on grooming tasks and styling hair sitting EOB. Pt able to brush hair forwards in preparation for putting her hair into a ponytail with min verbal cues to ensure all hair was gather. Pt then utilized B UE bring hair into ponytail with assistance required to tighten ponytail and to donn hair tie. Pt completed stand step to wc using RW with CGA. Pt was left resting in wc with call bell in reach, chair alarm on, telesitter on, and all needs met.    Therapy Documentation Precautions:  Precautions Precautions: Fall Precaution Comments: decreased stand balance- reliant on UE, Pain in LLE limits mobility at times Required Braces or Orthoses: Other Brace Other Brace: B adjustable night splints; B elbow cosey splints; L palm protector; R mitt Restrictions Weight Bearing Restrictions Per Provider Order: No   Therapy/Group: Individual Therapy  Nicole [name] 05/22/2023,  7:56 AM

## 2023-05-22 NOTE — Progress Notes (Signed)
 PROGRESS NOTE   Subjective/Complaints: C/o itchiness in breasts- sarna lotion ordered Otherwise no complaints  Tolerated therapy well today Denies pain   ROS: as per HPI. Denies CP, SOB, abd pain, N/V/D/C, or any other complaints at this time.      Objective:   No results found.   No results for input(s): WBC, HGB, HCT, PLT in the last 72 hours.         No results for input(s): NA, K, CL, CO2, GLUCOSE, BUN, CREATININE, CALCIUM  in the last 72 hours.          Intake/Output Summary (Last 24 hours) at 05/22/2023 1004 Last data filed at 05/22/2023 0752 Gross per 24 hour  Intake 774 ml  Output --  Net 774 ml           Physical Exam: Vital Signs Blood pressure (!) 129/95, pulse 74, temperature 98 F (36.7 C), resp. rate 18, height 5' 1 (1.549 m), weight 58 kg, SpO2 100%. Constitutional: No distress . Vital signs reviewed. Resting in bed HEENT: NCAT, EOMI, oral membranes moist Neck: supple Cardiovascular: RRR without murmur. No JVD    Respiratory/Chest: CTA Bilaterally without wheezes or rales. Normal effort    GI/Abdomen: BS +, non-tender, non-distended Ext: no clubbing, cyanosis, or edema Psych: flat but cooperative  Skin: + Scaling dried skin on bilateral heels left buttock area no examined as she was up and dressed + Left heel stage III DTI has resolved Neuro: memory improving. Remembers staff and myself by name! Neurologic: AAOx2, fair insight and awareness.[name]  Phonation and speech intelligability much improved. Fair insight. MAS 1 right elbow,MAS  tr to 1 in RIght finger and wrist flexors  MAS 1 in extensors of LE's MAS 1 L elbow , MAS 1+ in L wrist and finger flexors. Hypersensitivity to left hand improved, Left sided strength improved, decreased range of motion and pain at end range of motion in left shoulder, stable 1/7  Musculoskeletal:some limitations in right  shoulder ROM     Assessment/Plan: 1. Functional deficits which require 3+ hours per day of interdisciplinary therapy in a comprehensive inpatient rehab setting. Physiatrist is providing close team supervision and 24 hour management of active medical problems listed below. Physiatrist and rehab team continue to assess barriers to discharge/monitor patient progress toward functional and medical goals  Care Tool:  Bathing    Body parts bathed by patient: Face, Abdomen, Chest, Left upper leg, Right upper leg, Left arm, Front perineal area, Right arm, Right lower leg, Left lower leg, Buttocks   Body parts bathed by helper: Buttocks Body parts n/a: Right arm, Left arm, Front perineal area, Buttocks, Right upper leg, Left upper leg, Right lower leg, Left lower leg   Bathing assist Assist Level: Supervision/Verbal cueing     Upper Body Dressing/Undressing Upper body dressing   What is the patient wearing?: Pull over shirt    Upper body assist Assist Level: Set up assist    Lower Body Dressing/Undressing Lower body dressing      What is the patient wearing?: Underwear/pull up, Pants     Lower body assist Assist for lower body dressing: Minimal Assistance - Patient > 75%  Toileting Toileting Toileting Activity did not occur Press Photographer and hygiene only): N/A (no void or bm)  Toileting assist Assist for toileting: Supervision/Verbal cueing     Transfers Chair/bed transfer  Transfers assist  Chair/bed transfer activity did not occur: Safety/medical concerns  Chair/bed transfer assist level: Contact Guard/Touching assist Chair/bed transfer assistive device: Walker, Armrests   Locomotion Ambulation   Ambulation assist   Ambulation activity did not occur: Safety/medical concerns  Assist level: Contact Guard/Touching assist Assistive device: Walker-rolling Max distance: 150   Walk 10 feet activity   Assist  Walk 10 feet activity did not occur:  Safety/medical concerns  Assist level: Contact Guard/Touching assist Assistive device: Walker-rolling   Walk 50 feet activity   Assist Walk 50 feet with 2 turns activity did not occur: Safety/medical concerns  Assist level: Contact Guard/Touching assist Assistive device: Walker-rolling    Walk 150 feet activity   Assist Walk 150 feet activity did not occur: Safety/medical concerns  Assist level: Contact Guard/Touching assist Assistive device: Walker-rolling    Walk 10 feet on uneven surface  activity   Assist Walk 10 feet on uneven surfaces activity did not occur: Safety/medical concerns   Assist level: Minimal Assistance - Patient > 75% Assistive device: Walker-rolling   Wheelchair     Assist Is the patient using a wheelchair?: No Type of Wheelchair: Manual    Wheelchair assist level: Moderate Assistance - Patient 50 - 74% Max wheelchair distance: 78ft    Wheelchair 50 feet with 2 turns activity    Assist    Wheelchair 50 feet with 2 turns activity did not occur: Safety/medical concerns   Assist Level: Supervision/Verbal cueing   Wheelchair 150 feet activity     Assist  Wheelchair 150 feet activity did not occur: Safety/medical concerns   Assist Level: Moderate Assistance - Patient 50 - 74%   Blood pressure (!) 129/95, pulse 74, temperature 98 F (36.7 C), resp. rate 18, height 5' 1 (1.549 m), weight 58 kg, SpO2 100%.  Medical Problem List and Plan: 1. 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 Awaiting approval of disability. Placement pending. Hoping that hospital will provide LOG for placement. Code  status changed to full code after obtaining patient consent Continue CIR therapies including PT, OT, and SLP  -still waiting on SNF offers  2.  Impaired mobility: d/c lovenox  since ambulating >150 feet, continue SCDs  3. Knee pain: Improved, decrease gabapentin  to 100mg  BID Provided list of foods for pain. Tylenol  as needed, d/c voltaren  gel. XR reviewed and is stable, discussed with therapy -Right wrist swelling: XR ordered and shows no acute fracture, shows 2.56mm ulnar variance, brace removed to given patient a break from it. Voltaren  gel ordered prn for discomfort, continue, CT ordered and show no acute osseus injury, continue ace wrap   -Foot pain: tramadol  ordered prn, improved today, continue prn  -Headaches:continue tylenol  and tramadol  prn -Low back pain: continue kpad, asked [place]  to please bring for her -Whole body hypersensitivity: gabapentin  100mg  TID ordered -Pain in left heel: XR obtained and negative, cam boot ordered to help offload heel with therapy, discussed that this has helped -R shoulder pain: XR neg for acute  etiology on 04/06/23, voltaren  gel ordered--encourage ROM as tolerated 1/3-5 denying any pain   4. History of anxiety: discussed with her mom that she feels this was a big risk factor for her accident, propanolol d/ced due to hypotension. Continue magnesium  supplement. Asked SW to place her on neuropsych list, Buspar  ordered prn, discussed placing outpatient referral for behavioral health f/u  5. Neuropsych/cognition: This patient is not capable of making decisions on her own behalf  - 10/12: Still not capable of medical decision making; BUT oriented and communicating, following directions - MUCH improved - PRN melatonin 3mg    for insomnia  - Buspar  prn  6. Skin: -Buttock/sacrum--foam dressing, pressure relief, nutrition -stage 3 left heel, continue foam dressing/ pressure relief  7. Fluids/Electrolytes/Nutrition: Routine in and outs with follow-up  chemistries  -Eating  well with cueing   -Dysphagia: continue D3/thins--tolerating this diet -Low protein stores: d/c prosource since patient hates these and is eating 100% of meals -Suboptimal vitamin D  level: increase to 5,000 U daily. continue this dose  -History of magnesium  deficiency: magnesium  500mg  stopped to support BP  - good po intake  8.  Cavitary right lower lobe pneumonia likely aspiration pneumonia/MRSA pneumonia.  Resolved    Latest Ref Rng & Units 05/16/2023    6:28 AM 05/02/2023    5:20 AM 04/18/2023    5:16 AM  CBC  WBC 4.0 - 10.5 K/uL 6.2  5.9  6.0   Hemoglobin 12.0 - 15.0 g/dL 85.0  85.1  85.9   Hematocrit 36.0 - 46.0 % 42.1  43.0  41.6   Platelets 150 - 400 K/uL 300  241  194      9.  History of drug abuse.  Positive cocaine on urine drug screen.  Provide counseling  10.  AKI/hypovolemia and ATN.  Resolved     Latest Ref Rng & Units 05/16/2023    6:28 AM 05/02/2023    5:20 AM 04/18/2023    5:16 AM  BMP  Glucose 70 - 99 mg/dL 87  89  79   BUN 6 - 20 mg/dL 15  14  17    Creatinine 0.44 - 1.00 mg/dL 9.24  9.20  9.32   Sodium 135 - 145 mmol/L 136  138  137   Potassium 3.5 - 5.1 mmol/L 3.9  4.0  3.8   Chloride 98 - 111 mmol/L 105  109  105   CO2 22 - 32 mmol/L 22  23  23    Calcium  8.9 - 10.3 mg/dL 8.9  8.9  9.0     11.  Mild transaminitis with rhabdomyolysis.  Both resolved  12.  Hypotension:  D/c amantadine  -12/28-29/24 BPs mostly stable, monitor 12/31-1/1 BP soft, but per therapy, not orthostatic 1/2-3- BP soft- 90s/50s- but not orthostatic Sx's per staff--careful with flomax  on board 1/4-5/25 BPs staying stable, cont monitoring Vitals:   05/18/23 1508 05/18/23 1914 05/19/23 0306 05/19/23 1329  BP: 106/68 102/64 101/60 105/67   05/19/23 1923 05/20/23 0358 05/20/23 1326 05/20/23 1936  BP: 104/75 105/73 102/70 104/70   05/21/23 0507 05/21/23 1322 05/21/23 2015 05/22/23 0408  BP: 94/64 (!) 99/52 110/75 (!) 129/95    13. HTN: resolved   14. Impaired  initiation: resolved, d/c amantadine   15. Hyperglycemia: CBGs mildly elevated, d/ced checks. Prosource d/ced as per patient's request  16. Spasticity:  Continue wrist, elbow brace, and PRAFOs. Ordered a night splint for LLE  D/c baclofen  and tizanidine - much improved  17.  Dyskinetic movements in head and neck -  appears to be myoclonic jerks related to hypoxic brain injury - as discussed with pt mom, no specific meds for this on antispasticity meds , consioder benzo although with pt's SUD would avoid this class - EEG reviewed and was negative for seizure.  -  resolved   18. Cervical extensor weakness: decrease tizanidine  to 2mg  HS, decrease baclofen  to 5mg  QHS, improved, d/c'd  19. Bowel and bladder incontinence: continue bowel and bladder program. D/c miralax , decrease tizanidine  to 2mg  HS, d/c'd  12/30- LBM 2 days ago- was large- co abd pain- will get KUB- if full of stool- will need to be cleaned out.     1/3--appears to be voiding better with addition of flomax . No dizziness reported but will need to be careful with soft bp's   -we ned to scan for  a few pvr's to see how much she's emptying       -last bm 1/3 -05/19/23 BM today, PVRs low, cont regimen but don't need to cont checking PVRs at this point.  -05/20/23 BM yesterday continent, mixed continence/incontinence with urination, cont timed toileting.   20. Fatigue: resolved, discussed with team may be secondary to anti-spasticity medications, B12 reviewed and is 335, in suboptimal range, will give 1,000U B12 injection 8/7, will order monthly as needed, metanx started, increase to BID, continue, d/c tizanidine , discussed with patient, d/c baclofen    21. MRSA nares positive: negative on repeat, precautions d/ced  22. Tachycardic: resolved, d/c propanolol. Continue tizanidine  2mg  HS. Resolved, amantadine  increased back to 100mg  BID, continue this dose, resolved  23. S/p fall: CT head reviewed and is stable  24. Bilateral foot drop:  bilateral AFOs ordered, discussed that these can help her to walk better  25. Vaginal itching: completed fluconazole . Having some discharge now. See below  26. Dysuria -treated twice for UTI's.   12/30- added Pyridium  100 mg TID x 2 days- to help dysuria. 12/31- no change in Dysuria- per nursing, has had since before Xmas- might be tear in mons/vaginal mucosa that's stinging when voids-   1/1- pt reports Burning gone is good- is done with ABX and pyridium - pt hates pyridium - freaks her out with color change.   1/3 see neurogenic bladder discussion above  27. Poor sitting balance: continue OT/PT, improving  28. Headaches: topamax  70m daily prn ordered, continue  29. Drowsiness with tramadol : d/ced  30. IUD in place: Pt states she has IUD x40yrs  and believes it's supposed to be removed soon(chart review- Dr. Lavoie placed Mirena  IUD on 07/01/19, good for 3yrs); needs outpatient follow-up with OB/GYN  31. Constipation: d/c baclofen , last BM 12/18, milk of magnesia ordered 12/20, LBM 12/28, continue magnesium  gluconate -12/30- LBM 12/28- but c/o abd pain- get KUB to see if needs to give Sorbitol .   -05/20/23 LBM yesterday, cont regimen  32. History of drug addiction: tramadol  d/ced as per mom's preference  33. Depression: discussed scheduling for outpatient psychology/psychiatry follow-up, discussed neuropsych f/u while in hospital  -appreciate Dr. Corina follow up  34. Sinusitis: claritin  ordered prn, continue  25. Pruritus: sarna lotion ordered     LOS: 185 days A FACE TO FACE EVALUATION WAS PERFORMED  Octavio Matheney P Masaru Chamberlin 05/22/2023, 10:04 AM

## 2023-05-23 MED ORDER — GABAPENTIN 100 MG PO CAPS
100.0000 mg | ORAL_CAPSULE | Freq: Every day | ORAL | Status: DC
  Administered 2023-05-24 – 2023-05-29 (×6): 100 mg via ORAL
  Filled 2023-05-23 (×6): qty 1

## 2023-05-23 NOTE — Patient Care Conference (Signed)
 Inpatient RehabilitationTeam Conference and Plan of Care Update Date: 05/23/2023   Time: 11:07 AM    Patient Name: Nicole [name]      Medical Record Number: [id number]  Date of Birth: [birth year] Sex: Female         Room/Bed: 4W09C/4W09C-01 Payor Info: Payor: [employer] / Plan: [employer] / Product Type: *No Product type* /    Admit Date/Time:  11/18/2022  3:15 PM  Primary Diagnosis:  Hypoxic brain injury [facility])  Hospital Problems: Principal Problem:   Hypoxic brain injury (HCC) Active Problems:   Protein-calorie malnutrition, severe   Essential hypertension   Pressure injury of skin   History of cocaine abuse (HCC)   Anxiety with depression    Expected Discharge Date: Expected Discharge Date:  (SNF pending)  Team Members Present: Physician leading conference: Dr. Sven Elks Social Worker Present: Rhoda Clement, LCSW Nurse Present: Barnie Ronde, RN;Monty Spicher Hilliard, RN PT Present: Sherlean Perks, PT OT Present: Delon Sharps, OT SLP Present: Rosina Downy, SLP PPS Coordinator present : Eleanor Colon, SLP     Current Status/Progress Goal Weekly Team Focus  Bowel/Bladder   Continent of B/B (occasional urinary incontinence). LBm 05/22/2023   Maintain Continence   Continue with timed toileting.    Swallow/Nutrition/ Hydration   plan to trial regular solids           ADL's   contact guard to min A   goals supervision - contact guard   functional mobility, increase ROM function with left UE/LE, adaptive yoga,    Mobility   CGA bed mobility, supervision sit<>stands using RW, SBA ambulation >161ft using RW, minA for stair navigation. Decreased safety awreness, mild impulsivity.   Goals upgraded to supervision  Working on higher level balance with stepping strategies for balance recovery, floor recovery transfers, and modified yoga for stretching and strengthening    Communication   improved vocal intensity in noisy environments with sup A    mod I   conistent speech intelligbility across settings and situations    Safety/Cognition/ Behavioral Observations  mod- I supervison for orientation, mod- max for functional problem solving, min verbal problem solving   sup- min A   basic- mildly complex problem solving, sustained attention, memory of novel and daily info    Pain   Denies pain   Remain pain free   Assess Q4 and prn    Skin   Gerhardts butt cream and occasional mepilex (prophylaxis)   No New skin breakdown  Assess QS and prn      Discharge Planning:  Looking into any facility to take pt still her disability has not been approved. Making progress in therapies and Mom feels not able to manage at home   Team Discussion: Patient post hypoxic brain injury; no real c/o pain. Breast pruritis. VS stable. Therapies are continuing to progress working on core stability, dynamic balance, apraxia, shoulder ROM and vocal intensity strategies. Tolerating D3 diet.  Patient on target to meet rehab goals: SNF placement  *See Care Plan and progress notes for long and short-term goals.   Revisions to Treatment Plan:  Stopped Voltaren  Gel.  Sarna lotion added. Bedside swallow study 05/24/23 Teaching Needs: Medications, safety, self care, gait/transfer training, skin care, toileting, etc.   Current Barriers to Discharge: Wound care and Lack of/limited family support  Possible Resolutions to Barriers: SNF recommended     Medical Summary Current Status: breast pruritis, polypharmacy, anxiety, dry skin, hypotension  Barriers to Discharge: Medical stability  Barriers  to Discharge Comments: breast pruritis, polypharmacy, anxiety, dry skin, hypotension Possible Resolutions to [employer] Focus: sarna lotion ordered, voltareng gel d/ced, continue Buspar , continue Eucerin, decrease gabapentin  to HS, d/c flomax    Continued Need for Acute Rehabilitation Level of Care: The patient requires daily medical management by a physician  with specialized training in physical medicine and rehabilitation for the following reasons: Direction of a multidisciplinary physical rehabilitation program to maximize functional independence : Yes Medical management of patient stability for increased activity during participation in an intensive rehabilitation regime.: Yes Analysis of laboratory values and/or radiology reports with any subsequent need for medication adjustment and/or medical intervention. : Yes   I attest that I was present, lead the team conference, and concur with the assessment and plan of the team.   Darice LITTIE Boring 05/23/2023, 5:20 PM

## 2023-05-23 NOTE — Progress Notes (Signed)
 Physical Therapy Session Note  Patient Details  Name: Nicole [name] MRN: [id number] Date of Birth: 1975/06/09  Today's Date: 05/23/2023 PT Individual Time: 0900-1014 + 1415-1443 PT Individual Time Calculation (min): 74 min  + 28 min  Short Term Goals: Week 1:  PT Short Term Goal 1 (Week 1): Pt will perform supine<>sit with supervision PT Short Term Goal 1 - Progress (Week 1): Other (comment) (new goal) PT Short Term Goal 2 (Week 1): Pt will ambulate at least 130ft with no more than min A, not using an AD. PT Short Term Goal 2 - Progress (Week 1): Other (comment) (new goal) PT Short Term Goal 3 (Week 1): Pt will demonstrate decreased fall risk as noted by an improvement of at least 7 points on the [employer] Test PT Short Term Goal 3 - Progress (Week 1): Other (comment) (new goal) PT Short Term Goal 4 (Week 1): Pt will navigate 4 steps using HRs with no more than CGA  Skilled Therapeutic Interventions/Progress Updates:      1st session: Pt lying in bed - has no c/o pain. Dressed and ready for therapy. Bed mobility completed supervision with hospital bed features.   Sit<>stand to RW with CGA and ambulated with SBA and RW to day room rehab gym, ~188ft.   Completed mat table NMR for core strengthening, stretching, and segmental movement training. Various activities completed including spinal twists, sit up's, crunches, forearm planks, modified dead bugs, 4-point quadruped modified bird dogs, tall kneeling glut sets. Self selected music played to help with muscle recruitment and tolerance.   Pt assisted on Kinetron and she completed x7 minutes at L3cm/sec resistance, encouraging full ARO bilaterally and working on attention to foot placement on L due to tendency to push with toes rather than foot.   Pt participated in shooting basketball to goal in unsupported standing and ambulation to challenge dynamic standing balance, stepping strategies for balance recovery, and picking up basketball from  standing position. Pt requires min/modA for balance for these tasks.   Returned to her room and patient left sitting in wheelchair with chair pad alarm on, all needs met.   2nd session: Pt sitting in wheelchair and ready for therapy. Sit<>stand to RW with CGA with safety cues for pacing and safety awareness. Ambulated with SBA and intermittent CGA with RW from her room to main rehab gym. Continue to work on dynamic standing balance by playing game of bowling in unsupported standing position with minA for balance. Seated rest breaks b/w attempts for safety as PT retrieved ball. Ambulated back to her room with similar assist and cues - continues to have decreased safety awareness with quick movements, quick turns, and unsteadiness when distracted. Ended session seated in wheelchair with all needs met at end of session.     Therapy Documentation Precautions:  Precautions Precautions: Fall Precaution Comments: decreased stand balance- reliant on UE, Pain in LLE limits mobility at times Required Braces or Orthoses: Other Brace Other Brace: B adjustable night splints; B elbow cosey splints; L palm protector; R mitt Restrictions Weight Bearing Restrictions Per Provider Order: No General:     Therapy/Group: Individual Therapy  Shemica Meath P Misheel Gowans PT 05/23/2023, 7:44 AM

## 2023-05-23 NOTE — Progress Notes (Signed)
 Speech Language Pathology Daily Session Note  Patient Details  Name: Nicole [name] MRN: [id number] Date of Birth: 1975-12-28  Today's Date: 05/23/2023 SLP Individual Time: 1447-1530 SLP Individual Time Calculation (min): 43 min  Short Term Goals: Week 9: SLP Short Term Goal 1 (Week 9): Week 26 - Pt will solve mildly complex environmental problems with 70% accuracy given min assist. SLP Short Term Goal 2 (Week 9): Week 26 - Pt will recall basic daily information using external aids with 90% accuracy given min assist. SLP Short Term Goal 3 (Week 9): Week 26 - Pt will utilize recommended handwriting strategies to achieve 90% legebility given min verbal cues in 4/5 opportunities at the phrase and sentence levels. SLP Short Term Goal 4 (Week 9): Week 26 - Pt will utilize increased vocal intensity w/ min verbal cues to remain 90% intelligibile at conversation level in noisy environments in 4/5 opportunities.  Skilled Therapeutic Interventions: Skilled therapy session focused on cognitive goals. SLP faciliated session by prompting patient to read aloud story. After each paragraph, patient verbalized summary with mod i A. At the end of the story, patient required minA to summarize entire story and answer comprehension questions. SLP then prompted patient to write out summary utilzing slow and large handwriting strategies. Patient required maxA this date to utilize strategies in order to increase legibility with noted increased impulsivity. At the end of session, patient with incontinence of bladder requiring transfer to BR via RW and peri-care. Patient left in [facility] with alarm set and call bell in reach. Continue POC.  Pain None reported   Therapy/Group: Individual Therapy  Skye Plamondon M.A., CF-SLP 05/23/2023, 7:45 AM

## 2023-05-23 NOTE — Progress Notes (Signed)
 Patient ID: Nicole [name], female   DOB: 11/05/1975, 48 y.o.   MRN: [id number] Spoke with [facility] and went over [facility] case to see if could offer a bed. She is too young for their facilities and her drug history is an issue. So not able to consider her for admission

## 2023-05-23 NOTE — Progress Notes (Signed)
 PROGRESS NOTE   Subjective/Complaints: No new complaints this morning Denies pain Team conference today Doing sit-ups with physical therapy   ROS: as per HPI. Denies CP, SOB, abd pain, N/V/D/C, pain or any other complaints at this time.      Objective:   No results found.   No results for input(s): WBC, HGB, HCT, PLT in the last 72 hours.         No results for input(s): NA, K, CL, CO2, GLUCOSE, BUN, CREATININE, CALCIUM  in the last 72 hours.          Intake/Output Summary (Last 24 hours) at 05/23/2023 1128 Last data filed at 05/23/2023 0826 Gross per 24 hour  Intake 714 ml  Output --  Net 714 ml           Physical Exam: Vital Signs Blood pressure 99/82, pulse 81, temperature 97.7 F (36.5 C), resp. rate 16, height 5' 1 (1.549 m), weight 58 kg, SpO2 97%. Constitutional: No distress . Vital signs reviewed. Resting in bed HEENT: NCAT, EOMI, oral membranes moist Neck: supple Cardiovascular: RRR without murmur. No JVD    Respiratory/Chest: CTA Bilaterally without wheezes or rales. Normal effort    GI/Abdomen: BS +, non-tender, non-distended Ext: no clubbing, cyanosis, or edema Psych: flat but cooperative  Skin: + Scaling dried skin on bilateral heels left buttock area no examined as she was up and dressed + Left heel stage III DTI has resolved Neuro: memory improving. Remembers staff and myself by name! Neurologic: AAOx2, fair insight and awareness.[name]  Phonation and speech intelligability much improved. Fair insight. MAS 1 right elbow,MAS  tr to 1 in RIght finger and wrist flexors  MAS 1 in extensors of LE's MAS 1 L elbow , MAS 1+ in L wrist and finger flexors. Hypersensitivity to left hand improved, Left sided strength improved, decreased range of motion and pain at end range of motion in left shoulder, stable 1/8  Musculoskeletal:some limitations in right shoulder ROM      Assessment/Plan: 1. Functional deficits which require 3+ hours per day of interdisciplinary therapy in a comprehensive inpatient rehab setting. Physiatrist is providing close team supervision and 24 hour management of active medical problems listed below. Physiatrist and rehab team continue to assess barriers to discharge/monitor patient progress toward functional and medical goals  Care Tool:  Bathing    Body parts bathed by patient: Face, Abdomen, Chest, Left upper leg, Right upper leg, Left arm, Front perineal area, Right arm, Right lower leg, Left lower leg, Buttocks   Body parts bathed by helper: Buttocks Body parts n/a: Right arm, Left arm, Front perineal area, Buttocks, Right upper leg, Left upper leg, Right lower leg, Left lower leg   Bathing assist Assist Level: Supervision/Verbal cueing     Upper Body Dressing/Undressing Upper body dressing   What is the patient wearing?: Pull over shirt    Upper body assist Assist Level: Set up assist    Lower Body Dressing/Undressing Lower body dressing      What is the patient wearing?: Underwear/pull up, Pants     Lower body assist Assist for lower body dressing: Minimal Assistance - Patient > 75%  Toileting Toileting Toileting Activity did not occur Press Photographer and hygiene only): N/A (no void or bm)  Toileting assist Assist for toileting: Supervision/Verbal cueing     Transfers Chair/bed transfer  Transfers assist  Chair/bed transfer activity did not occur: Safety/medical concerns  Chair/bed transfer assist level: Contact Guard/Touching assist Chair/bed transfer assistive device: Walker, Armrests   Locomotion Ambulation   Ambulation assist   Ambulation activity did not occur: Safety/medical concerns  Assist level: Contact Guard/Touching assist Assistive device: Walker-rolling Max distance: 150   Walk 10 feet activity   Assist  Walk 10 feet activity did not occur: Safety/medical  concerns  Assist level: Contact Guard/Touching assist Assistive device: Walker-rolling   Walk 50 feet activity   Assist Walk 50 feet with 2 turns activity did not occur: Safety/medical concerns  Assist level: Contact Guard/Touching assist Assistive device: Walker-rolling    Walk 150 feet activity   Assist Walk 150 feet activity did not occur: Safety/medical concerns  Assist level: Contact Guard/Touching assist Assistive device: Walker-rolling    Walk 10 feet on uneven surface  activity   Assist Walk 10 feet on uneven surfaces activity did not occur: Safety/medical concerns   Assist level: Minimal Assistance - Patient > 75% Assistive device: Walker-rolling   Wheelchair     Assist Is the patient using a wheelchair?: No Type of Wheelchair: Manual    Wheelchair assist level: Moderate Assistance - Patient 50 - 74% Max wheelchair distance: 82ft    Wheelchair 50 feet with 2 turns activity    Assist    Wheelchair 50 feet with 2 turns activity did not occur: Safety/medical concerns   Assist Level: Supervision/Verbal cueing   Wheelchair 150 feet activity     Assist  Wheelchair 150 feet activity did not occur: Safety/medical concerns   Assist Level: Moderate Assistance - Patient 50 - 74%   Blood pressure 99/82, pulse 81, temperature 97.7 F (36.5 C), resp. rate 16, height 5' 1 (1.549 m), weight 58 kg, SpO2 97%.  Medical Problem List and Plan: 1. 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 Awaiting approval of disability. Placement pending. Hoping that hospital will provide LOG for placement. Code status changed to full  code after obtaining patient consent Continue CIR therapies including PT, OT, and SLP  -still waiting on SNF offers  2.  Impaired mobility: d/c lovenox  since ambulating >150 feet, continue SCDs  3. Knee pain: Improved, decrease gabapentin  to 100mg  BID Provided list of foods for pain. Tylenol  as needed, d/c voltaren  gel. XR reviewed and is stable, discussed with therapy -Right wrist swelling: XR ordered and shows no acute fracture, shows 2.63mm ulnar variance, brace removed to given patient a break from it. Voltaren  gel ordered prn for discomfort, continue, CT ordered and show no acute osseus injury, continue ace wrap   -Foot pain: tramadol  ordered prn, improved today, continue prn  -Headaches:continue tylenol  and tramadol  prn -Low back pain: continue kpad, asked [place]  to please bring for her -Whole body hypersensitivity: gabapentin  100mg  TID ordered -Pain in left heel: XR obtained and negative, cam boot ordered to help offload heel with therapy, discussed that this has helped -R shoulder pain: XR neg for acute etiology  on 04/06/23, voltaren  gel ordered--encourage ROM as tolerated 1/3-5 denying any pain   4. History of anxiety: discussed with her mom that she feels this was a big risk factor for her accident, propanolol d/ced due to hypotension. Continue magnesium  supplement. Asked SW to place her on neuropsych list, Buspar  ordered prn, discussed placing outpatient referral for behavioral health f/u  5. Neuropsych/cognition: This patient is not capable of making decisions on her own behalf  - 10/12: Still not capable of medical decision making; BUT oriented and communicating, following directions - MUCH improved - PRN melatonin 3mg    for insomnia  - Buspar  prn  6. Skin: -Buttock/sacrum--foam dressing, pressure relief, nutrition -stage 3 left heel, continue foam dressing/ pressure relief  7. Fluids/Electrolytes/Nutrition: Routine in and outs with follow-up chemistries  -Eating  well with  cueing   -Dysphagia: continue D3/thins--tolerating this diet -Low protein stores: d/c prosource since patient hates these and is eating 100% of meals -Suboptimal vitamin D  level: increase to 5,000 U daily. continue this dose  -History of magnesium  deficiency: magnesium  500mg  stopped to support BP  - good po intake  8.  Cavitary right lower lobe pneumonia likely aspiration pneumonia/MRSA pneumonia.  Resolved    Latest Ref Rng & Units 05/16/2023    6:28 AM 05/02/2023    5:20 AM 04/18/2023    5:16 AM  CBC  WBC 4.0 - 10.5 K/uL 6.2  5.9  6.0   Hemoglobin 12.0 - 15.0 g/dL 85.0  85.1  85.9   Hematocrit 36.0 - 46.0 % 42.1  43.0  41.6   Platelets 150 - 400 K/uL 300  241  194      9.  History of drug abuse.  Positive cocaine on urine drug screen.  Provide counseling  10.  AKI/hypovolemia and ATN.  Resolved     Latest Ref Rng & Units 05/16/2023    6:28 AM 05/02/2023    5:20 AM 04/18/2023    5:16 AM  BMP  Glucose 70 - 99 mg/dL 87  89  79   BUN 6 - 20 mg/dL 15  14  17    Creatinine 0.44 - 1.00 mg/dL 9.24  9.20  9.32   Sodium 135 - 145 mmol/L 136  138  137   Potassium 3.5 - 5.1 mmol/L 3.9  4.0  3.8   Chloride 98 - 111 mmol/L 105  109  105   CO2 22 - 32 mmol/L 22  23  23    Calcium  8.9 - 10.3 mg/dL 8.9  8.9  9.0     11.  Mild transaminitis with rhabdomyolysis.  Both resolved  12.  Hypotension:  D/c amantadine , d/cflomax  13. HTN: resolved   14. Impaired initiation: resolved, d/c amantadine   15. Hyperglycemia: CBGs mildly elevated, d/ced checks. Prosource d/ced as per patient's request  16. Spasticity:  Continue wrist, elbow brace, and PRAFOs. Ordered a night splint for LLE  D/c baclofen  and tizanidine - much improved  17.  Dyskinetic movements in head and neck - appears to be myoclonic jerks related to hypoxic brain injury - as discussed with pt mom, no specific meds for this on antispasticity meds , consioder benzo although with pt's SUD would avoid this class - EEG reviewed and was  negative for seizure.  -  resolved   18. Cervical extensor weakness: decrease tizanidine  to 2mg  HS, decrease baclofen  to 5mg  QHS, improved, d/c'd  19. Bowel and bladder incontinence: continue bowel and bladder program. D/c miralax , decrease tizanidine  to 2mg  HS, d/c'd  20.  Fatigue: resolved, discussed with team may be secondary to anti-spasticity medications, B12 reviewed and is 335, in suboptimal range, will give 1,000U B12 injection 8/7, will order monthly as needed, metanx started, increase to BID, continue, d/c tizanidine , discussed with patient, d/c baclofen    21. MRSA nares positive: negative on repeat, precautions d/ced  22. Tachycardic: resolved, d/c propanolol. Continue tizanidine  2mg  HS. Resolved, amantadine  increased back to 100mg  BID, continue this dose, resolved  23. S/p fall: CT head reviewed and is stable  24. Bilateral foot drop: bilateral AFOs ordered, discussed that these can help her to walk better  25. Vaginal itching: completed fluconazole . Having some discharge now. See below  26. Dysuria -treated twice for UTI's.   12/30- added Pyridium  100 mg TID x 2 days- to help dysuria. 12/31- no change in Dysuria- per nursing, has had since before Xmas- might be tear in mons/vaginal mucosa that's stinging when voids-   1/1- pt reports Burning gone is good- is done with ABX and pyridium - pt hates pyridium - freaks her out with color change.   1/3 see neurogenic bladder discussion above  27. Poor sitting balance: continue OT/PT, improving  28. Headaches: topamax  36m daily prn ordered, continue  29. Drowsiness with tramadol : d/ced  30. IUD in place: Pt states she has IUD x50yrs  and believes it's supposed to be removed soon(chart review- Dr. Lavoie placed Mirena  IUD on 07/01/19, good for 65yrs); needs outpatient follow-up with OB/GYN  31. Constipation: d/c baclofen , last BM 12/18, milk of magnesia ordered 12/20, continue magnesium  gluconate   32. History of drug  addiction: tramadol  d/ced as per mom's preference  33. Depression: discussed scheduling for outpatient psychology/psychiatry follow-up, discussed neuropsych f/u while in hospital  -appreciate Dr. Corina follow up  34. Sinusitis: claritin  ordered prn, continue  25. Pruritus: sarna lotion ordered, discussed that this is prn     LOS: 186 days A FACE TO FACE EVALUATION WAS PERFORMED  Josaiah Muhammed P Chanoch Mccleery 05/23/2023, 11:28 AM

## 2023-05-23 NOTE — Progress Notes (Signed)
 Occupational Therapy Session Note  Patient Details  Name: Nicole [name] MRN: [id number] Date of Birth: [birth year]  Today's Date: 05/23/2023 OT Individual Time: 1050-1130 OT Individual Time Calculation (min): 40 min    Short Term Goals: Week 5:  OT Short Term Goal 1 (Week 5): Patient will grab hair to place in ponytail with BUE OT Short Term Goal 1 - Progress (Week 5): Other (comment) OT Short Term Goal 2 (Week 5): Patient will trial wearing underwear during weekday x 2 OT Short Term Goal 2 - Progress (Week 5): Other (comment) OT Short Term Goal 3 (Week 5): Patient will complete laundry with min assist. OT Short Term Goal 3 - Progress (Week 5): Other (comment) OT Short Term Goal 4 (Week 5): Patient will prepare a simple snack while walking in the kitchen OT Short Term Goal 4 - Progress (Week 5): Other (comment) OT Short Term Goal 5 (Week 5): xxx OT Short Term Goal 5 - Progress (Week 5): Other (comment)  Skilled Therapeutic Interventions/Progress Updates:  Skilled OT intervention completed with focus on ADL retraining, functional ambulation, cognition and FMC. Pt received seated in w/c, agreeable to session. No pain reported.  Pt verbalized aren't you early? However OT on time. Pt reported reading her clock in room is hard for her to understand with request to address that concern.   Pt initiated need to change her brief, however unable to feel when it's occurring or ahead of time. Completed all sit > stands and ambulatory transfers with CGA using RW with verbal cues for safety intermittently.   Ambulated > toilet, CGA for lowering pants, min A to doff skinny jeans over feet, no assist to doff soiled brief with urine. Further continent of urinary void. Supervision for seated peri hygiene, then with CGA for sitting balance only, pt used figure 4 position for threading brief. Assist needed to thread new pair of skinny jeans over feet then was able to donn over hips. Cues needed to initiate  hand hygiene after toileting but only CGA for balance during task.  Ambulated > gym. Seated at table, pt engaged in building a clock activity to address her concern with inability to tell time on analog clock. With a blank slate, pt was noted to accurately begin clock on Rt side working in clockwise direction, but with about 75% accuracy for it's entirety, with distinguishable difference on Lt side of paper with poor spatial awareness and sequencing. With task modified by OT providing clock with blocks for correct spatial placement, pt able to fill out with 100% accuracy numerically, but did need min A to differentiate the hour vs mins hand of clock and for drawing them when given a time I.e. 1:45, and asked to draw them herself.  Completed 9 hole peg test to address FM coordination needed for BADLs with the following results: -Rt hand 28 sec -Lt hand 38 sec We discussed differences in coordination between sides of her body and pt also self-recognized following commands (waiting for the timer to start and only transferring 1 at a time) was challenging for her as pt needed 3 trials to complete accurately/as designed.  Ambulated back to room. Pt remained seated in w/c with chair alarm and telesitter activated and with all needs in reach at end of session.   Therapy Documentation Precautions:  Precautions Precautions: Fall Precaution Comments: decreased stand balance- reliant on UE, Pain in LLE limits mobility at times Required Braces or Orthoses: Other Brace Other Brace: B adjustable night splints; B  elbow cosey splints; L palm protector; R mitt Restrictions Weight Bearing Restrictions Per Provider Order: No    Therapy/Group: Individual Therapy  [name], MS, OTR/L  05/23/2023, 5:34 PM

## 2023-05-24 MED ORDER — HYDROCORTISONE 1 % EX CREA
TOPICAL_CREAM | Freq: Two times a day (BID) | CUTANEOUS | Status: DC
  Filled 2023-05-24: qty 28

## 2023-05-24 MED ORDER — MAGNESIUM GLUCONATE 500 MG PO TABS
250.0000 mg | ORAL_TABLET | Freq: Every day | ORAL | Status: DC
  Administered 2023-05-24 – 2023-05-29 (×6): 250 mg via ORAL
  Filled 2023-05-24 (×6): qty 1

## 2023-05-24 NOTE — Progress Notes (Signed)
 PROGRESS NOTE   Subjective/Complaints: No new complaints this morning BP is soft, decreased magnesium  gluconate to 250mg  HS Tolerated SLP well today   ROS: as per HPI. Denies CP, SOB, abd pain, N/V/D/C, pain or any other complaints at this time.      Objective:   No results found.   No results for input(s): WBC, HGB, HCT, PLT in the last 72 hours.         No results for input(s): NA, K, CL, CO2, GLUCOSE, BUN, CREATININE, CALCIUM  in the last 72 hours.          Intake/Output Summary (Last 24 hours) at 05/24/2023 1119 Last data filed at 05/24/2023 0800 Gross per 24 hour  Intake 840 ml  Output --  Net 840 ml           Physical Exam: Vital Signs Blood pressure 99/71, pulse 73, temperature 98.1 F (36.7 C), resp. rate 18, height 5' 1 (1.549 m), weight 58 kg, SpO2 98%. Constitutional: No distress . Vital signs reviewed. Resting in bed HEENT: NCAT, EOMI, oral membranes moist Neck: supple Cardiovascular: RRR without murmur. No JVD    Respiratory/Chest: CTA Bilaterally without wheezes or rales. Normal effort    GI/Abdomen: BS +, non-tender, non-distended Ext: no clubbing, cyanosis, or edema Psych: flat but cooperative  Skin: + Scaling dried skin on bilateral heels left buttock area no examined as she was up and dressed + Left heel stage III DTI has resolved Neuro: memory improving. Remembers staff and myself by name! Neurologic: AAOx2, fair insight and awareness.[name]  Phonation and speech intelligability much improved. Fair insight. MAS 1 right elbow,MAS  tr to 1 in RIght finger and wrist flexors  MAS 1 in extensors of LE's MAS 1 L elbow , MAS 1+ in L wrist and finger flexors. Hypersensitivity to left hand improved, Left sided strength improved, decreased range of motion and pain at end range of motion in left shoulder, stable 1/9  Musculoskeletal:some limitations in right shoulder  ROM     Assessment/Plan: 1. Functional deficits which require 3+ hours per day of interdisciplinary therapy in a comprehensive inpatient rehab setting. Physiatrist is providing close team supervision and 24 hour management of active medical problems listed below. Physiatrist and rehab team continue to assess barriers to discharge/monitor patient progress toward functional and medical goals  Care Tool:  Bathing    Body parts bathed by patient: Face, Abdomen, Chest, Left upper leg, Right upper leg, Left arm, Front perineal area, Right arm, Right lower leg, Left lower leg, Buttocks   Body parts bathed by helper: Buttocks Body parts n/a: Right arm, Left arm, Front perineal area, Buttocks, Right upper leg, Left upper leg, Right lower leg, Left lower leg   Bathing assist Assist Level: Supervision/Verbal cueing     Upper Body Dressing/Undressing Upper body dressing   What is the patient wearing?: Pull over shirt    Upper body assist Assist Level: Set up assist    Lower Body Dressing/Undressing Lower body dressing      What is the patient wearing?: Underwear/pull up, Pants     Lower body assist Assist for lower body dressing: Minimal Assistance - Patient > 75%  Toileting Toileting Toileting Activity did not occur Press Photographer and hygiene only): N/A (no void or bm)  Toileting assist Assist for toileting: Supervision/Verbal cueing     Transfers Chair/bed transfer  Transfers assist  Chair/bed transfer activity did not occur: Safety/medical concerns  Chair/bed transfer assist level: Contact Guard/Touching assist Chair/bed transfer assistive device: Walker, Armrests   Locomotion Ambulation   Ambulation assist   Ambulation activity did not occur: Safety/medical concerns  Assist level: Contact Guard/Touching assist Assistive device: Walker-rolling Max distance: 150   Walk 10 feet activity   Assist  Walk 10 feet activity did not occur: Safety/medical  concerns  Assist level: Contact Guard/Touching assist Assistive device: Walker-rolling   Walk 50 feet activity   Assist Walk 50 feet with 2 turns activity did not occur: Safety/medical concerns  Assist level: Contact Guard/Touching assist Assistive device: Walker-rolling    Walk 150 feet activity   Assist Walk 150 feet activity did not occur: Safety/medical concerns  Assist level: Contact Guard/Touching assist Assistive device: Walker-rolling    Walk 10 feet on uneven surface  activity   Assist Walk 10 feet on uneven surfaces activity did not occur: Safety/medical concerns   Assist level: Minimal Assistance - Patient > 75% Assistive device: Walker-rolling   Wheelchair     Assist Is the patient using a wheelchair?: No Type of Wheelchair: Manual    Wheelchair assist level: Moderate Assistance - Patient 50 - 74% Max wheelchair distance: 53ft    Wheelchair 50 feet with 2 turns activity    Assist    Wheelchair 50 feet with 2 turns activity did not occur: Safety/medical concerns   Assist Level: Supervision/Verbal cueing   Wheelchair 150 feet activity     Assist  Wheelchair 150 feet activity did not occur: Safety/medical concerns   Assist Level: Moderate Assistance - Patient 50 - 74%   Blood pressure 99/71, pulse 73, temperature 98.1 F (36.7 C), resp. rate 18, height 5' 1 (1.549 m), weight 58 kg, SpO2 98%.  Medical Problem List and Plan: 1. 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 Awaiting approval of disability. Placement pending. Hoping that hospital will provide LOG for placement. Code status changed to full  code after obtaining patient consent Continue CIR therapies including PT, OT, and SLP  -still waiting on SNF offers  2.  Impaired mobility: d/c lovenox  since ambulating >150 feet, continue SCDs  3. Knee pain: Improved, decrease gabapentin  to 100mg  BID Provided list of foods for pain. Tylenol  as needed, d/c voltaren  gel. XR reviewed and is stable, discussed with therapy -Right wrist swelling: XR ordered and shows no acute fracture, shows 2.4mm ulnar variance, brace removed to given patient a break from it. Voltaren  gel ordered prn for discomfort, continue, CT ordered and show no acute osseus injury, continue ace wrap   -Foot pain: tramadol  ordered prn, improved today, continue prn  -Headaches:continue tylenol  and tramadol  prn -Low back pain: continue kpad, asked [place]  to please bring for her -Whole body hypersensitivity: gabapentin  100mg  TID ordered -Pain in left heel: XR obtained and negative, cam boot ordered to help offload heel with therapy, discussed that this has helped -R shoulder pain: XR neg for acute etiology  on 04/06/23, voltaren  gel ordered--encourage ROM as tolerated 1/3-5 denying any pain   4. History of anxiety: discussed with her mom that she feels this was a big risk factor for her accident, propanolol d/ced due to hypotension. Continue magnesium  supplement. Asked SW to place her on neuropsych list, Buspar  ordered prn, discussed placing outpatient referral for behavioral health f/u  5. Neuropsych/cognition: This patient is not capable of making decisions on her own behalf  - 10/12: Still not capable of medical decision making; BUT oriented and communicating, following directions - MUCH improved - PRN melatonin 3mg    for insomnia  - Buspar  prn  6. Skin: -Buttock/sacrum--foam dressing, pressure relief, nutrition -stage 3 left heel, continue foam dressing/ pressure relief  7. Fluids/Electrolytes/Nutrition: Routine in and outs with follow-up chemistries  -Eating  well with  cueing   -Dysphagia: continue D3/thins--tolerating this diet -Low protein stores: d/c prosource since patient hates these and is eating 100% of meals -Suboptimal vitamin D  level: increase to 5,000 U daily. continue this dose  -History of magnesium  deficiency: magnesium  500mg  stopped to support BP  - good po intake  8.  Cavitary right lower lobe pneumonia likely aspiration pneumonia/MRSA pneumonia.  Resolved    Latest Ref Rng & Units 05/16/2023    6:28 AM 05/02/2023    5:20 AM 04/18/2023    5:16 AM  CBC  WBC 4.0 - 10.5 K/uL 6.2  5.9  6.0   Hemoglobin 12.0 - 15.0 g/dL 85.0  85.1  85.9   Hematocrit 36.0 - 46.0 % 42.1  43.0  41.6   Platelets 150 - 400 K/uL 300  241  194      9.  History of drug abuse.  Positive cocaine on urine drug screen.  Provide counseling  10.  AKI/hypovolemia and ATN.  Resolved     Latest Ref Rng & Units 05/16/2023    6:28 AM 05/02/2023    5:20 AM 04/18/2023    5:16 AM  BMP  Glucose 70 - 99 mg/dL 87  89  79   BUN 6 - 20 mg/dL 15  14  17    Creatinine 0.44 - 1.00 mg/dL 9.24  9.20  9.32   Sodium 135 - 145 mmol/L 136  138  137   Potassium 3.5 - 5.1 mmol/L 3.9  4.0  3.8   Chloride 98 - 111 mmol/L 105  109  105   CO2 22 - 32 mmol/L 22  23  23    Calcium  8.9 - 10.3 mg/dL 8.9  8.9  9.0     11.  Mild transaminitis with rhabdomyolysis.  Both resolved  12.  Hypotension:  D/c amantadine , d/cflomax, magneisum gluconate decreased to 250mg  HS  13. HTN: resolved   14. Impaired initiation: resolved, d/c amantadine   15. Hyperglycemia: CBGs mildly elevated, d/ced checks. Prosource d/ced as per patient's request  16. Spasticity:  Continue wrist, elbow brace, and PRAFOs. Ordered a night splint for LLE  D/c baclofen  and tizanidine - much improved  17.  Dyskinetic movements in head and neck - appears to be myoclonic jerks related to hypoxic brain injury - as discussed with pt mom, no specific meds for this on antispasticity meds , consioder benzo although with pt's SUD  would avoid this class - EEG reviewed and was negative for seizure.  -  resolved   18. Cervical extensor weakness: decrease tizanidine  to 2mg  HS, decrease baclofen  to 5mg  QHS, improved, d/c'd  19. Bowel and bladder incontinence: continue bowel and bladder program. D/c miralax , decrease tizanidine   to 2mg  HS, d/c'd  20. Fatigue: resolved, discussed with team may be secondary to anti-spasticity medications, B12 reviewed and is 335, in suboptimal range, will give 1,000U B12 injection 8/7, will order monthly as needed, metanx started, increase to BID, continue, d/c tizanidine , discussed with patient, d/c baclofen    21. MRSA nares positive: negative on repeat, precautions d/ced  22. Tachycardic: resolved, d/c propanolol. Continue tizanidine  2mg  HS. Resolved, amantadine  increased back to 100mg  BID, continue this dose, resolved  23. S/p fall: CT head reviewed and is stable  24. Bilateral foot drop: bilateral AFOs ordered, discussed that these can help her to walk better  25. Vaginal itching: completed fluconazole . Having some discharge now. See below  26. Dysuria -treated twice for UTI's.   12/30- added Pyridium  100 mg TID x 2 days- to help dysuria. 12/31- no change in Dysuria- per nursing, has had since before Xmas- might be tear in mons/vaginal mucosa that's stinging when voids-   1/1- pt reports Burning gone is good- is done with ABX and pyridium - pt hates pyridium - freaks her out with color change.   1/3 see neurogenic bladder discussion above  27. Poor sitting balance: continue OT/PT, improving  28. Headaches: topamax  36m daily prn ordered, continue  29. Drowsiness with tramadol : d/ced  30. IUD in place: Pt states she has IUD x5yrs  and believes it's supposed to be removed soon(chart review- Dr. Lavoie placed Mirena  IUD on 07/01/19, good for 33yrs); needs outpatient follow-up with OB/GYN  31. Constipation: d/c baclofen , last BM 12/18, milk of magnesia ordered 12/20, continue  magnesium  gluconate   32. History of drug addiction: tramadol  d/ced as per mom's preference  33. Depression: discussed scheduling for outpatient psychology/psychiatry follow-up, discussed neuropsych f/u while in hospital  -appreciate Dr. Corina follow up  34. Sinusitis: claritin  ordered prn, continue  25. Pruritus: sarna lotion ordered, discussed that this is prn     LOS: 187 days A FACE TO FACE EVALUATION WAS PERFORMED  Mathew Storck P Jacquiline Zurcher 05/24/2023, 11:19 AM

## 2023-05-24 NOTE — Progress Notes (Signed)
 Recreational Therapy Session Note  Patient Details  Name: NALINI ALCARAZ MRN: [id number] Date of Birth: 06-Jul-1975 Today's Date: 05/24/2023  Pain: no c/o Goal:  Pt will maintain dynamic standing balance with no more than mod assist during moderately complex leisure activity.  MET  Skilled Therapeutic Interventions/Progress Updates: Pt participated in modified bowling, picking the ball up from the floor, rolling it down the lane and retrieving it with min assist for balance.  Pt ambulated over tile floors and on/off foam mat during game plan/set up.  Pt also stood and assisted with resetting the pins, maintaining a squat position~30 seconds with min-mod assist & min cues for safety.  Pt also assisted with clean up, retrieving pins from the floor, carrying them while ambulating back to the container with min-mod assist and min cues.  Pt smiling, joking and laughing throughout this session.    Therapy/Group: Co-Treatment Norell Brisbin 05/24/2023, 2:46 PM

## 2023-05-24 NOTE — Progress Notes (Signed)
 Speech Language Pathology Weekly Progress and Session Note  Patient Details  Name: Nicole [name] MRN: [id number] Date of Birth: 1975-08-20  Beginning of progress report period: May 18, 2023 End of progress report period: May 24, 2023  Today's Date: 05/24/2023 SLP Individual Time: 9197-9152 SLP Individual Time Calculation (min): 45 min  Short Term Goals: Week 9: SLP Short Term Goal 1 (Week 9): Week 27 - Pt will solve mildly complex environmental problems with 70% accuracy given min assist. SLP Short Term Goal 2 (Week 9): Week 27 - Pt will recall basic daily information using external aids with 90% accuracy given supervision assist. SLP Short Term Goal 3 (Week 9): Week 27 - Pt will utilize recommended handwriting strategies to achieve 90% legebility given supervision verbal cues in 4/5 opportunities at the phrase and sentence levels. SLP Short Term Goal 4 (Week 9): Week 27 - Pt will utilize increased vocal intensity w/ supervision verbal cues to remain 90% intelligibile at conversation level in noisy environments in 4/5 opportunities.  New Short Term Goals: Week 9: SLP Short Term Goal 1 (Week 9): Week 27 - Pt will solve mildly complex environmental problems with 70% accuracy given min assist. SLP Short Term Goal 2 (Week 9): Week 27 - Pt will recall basic daily information using external aids with 90% accuracy given supervision assist. SLP Short Term Goal 3 (Week 9): Week 27 - Pt will utilize recommended handwriting strategies to achieve 90% legebility given supervision verbal cues in 4/5 opportunities at the phrase and sentence levels. SLP Short Term Goal 4 (Week 9): Week 27 - Pt will utilize increased vocal intensity w/ supervision verbal cues to remain 90% intelligibile at conversation level in noisy environments in 4/5 opportunities.  Weekly Progress Updates: Patient continues to make excellent progress towards therapy goals, meeting 3/4 short term goals set this reporting period. Patient  currently utilizes speech intelligibility strategies in noisy environments adequately given min assist, recalls basic daily information using external/internal aids given min assist and utilizes proper handwriting strategies to improve legibility given min assist. Patient continues to require up to mod assist for environmental problem solving depending on task complexity and familiarity. Patient and family education ongoing. Patient will continue to benefit from skilled therapy services during remainder of CIR stay.    Intensity: Minumum of 1-2 x/day, 30 to 90 minutes Frequency: 3 to 5 out of 7 days Duration/Length of Stay: TBD due to SNF placement Treatment/Interventions: Cognitive remediation/compensation;Internal/external aids;Speech/Language facilitation;Cueing hierarchy;Environmental controls;Therapeutic Activities;Functional tasks;Multimodal communication approach;Patient/family education;Therapeutic Exercise  Rosina Downy, M.A., CCC-SLP  Akash Winski A Charese Abundis 05/24/2023, 10:08 AM

## 2023-05-24 NOTE — Progress Notes (Signed)
 Occupational Therapy Weekly Progress Note  Patient Details  Name: Nicole [name] MRN: [id number] Date of Birth: 04-Nov-1975  Beginning of progress report period: May 17, 2023 End of progress report period: May 24, 2023  Today's Date: 05/24/2023 OT Individual Time: 8896-8789 OT Individual Time Calculation (min): 67 min    Patient has met 2 of 4 short term goals.  Patient shows improved initiative within basic self care skills, but needs consistent cueing for next logical step, to identify problems and to generate reasonable solutions.    Patient continues to demonstrate the following deficits: muscle joint tightness, impaired timing and sequencing, abnormal tone, unbalanced muscle activation, motor apraxia, decreased coordination, and decreased motor planning, decreased visual acuity, decreased attention to left, decreased attention, decreased awareness, decreased problem solving, decreased safety awareness, decreased memory, and delayed processing, and decreased sitting balance, decreased standing balance, decreased postural control, hemiplegia, and decreased balance strategies and therefore will continue to benefit from skilled OT intervention to enhance overall performance with BADL, iADL, and Reduce care partner burden.  Patient progressing toward long term goals..  Continue plan of care.  OT Short Term Goals Week 4:  OT Short Term Goal 1 (Week 4): Patient will motor plan to transition from supine to sitting from flat surface with min assist OT Short Term Goal 1 - Progress (Week 4): Met OT Short Term Goal 2 (Week 4): Patient will OT Short Term Goal 2 - Progress (Week 4): Other (comment) OT Short Term Goal 3 (Week 4): Patient will don left sock with min assist OT Short Term Goal 3 - Progress (Week 4): Met OT Short Term Goal 4 (Week 4): Patient will trial wearing underwear during weekday x 2 OT Short Term Goal 4 - Progress (Week 4): Partly met OT Short Term Goal 5 (Week 4): Patient will  grab hair to place in ponytail with BUE OT Short Term Goal 5 - Progress (Week 4): Partly met Week 5:  OT Short Term Goal 1 (Week 5): Patient will grab hair to place in ponytail with BUE OT Short Term Goal 1 - Progress (Week 5): Partly met OT Short Term Goal 2 (Week 5): Patient will trial wearing underwear during weekday x 2 OT Short Term Goal 2 - Progress (Week 5): Not met OT Short Term Goal 3 (Week 5): Patient will complete laundry with min assist. OT Short Term Goal 3 - Progress (Week 5): Progressing toward goal OT Short Term Goal 4 (Week 5): Patient will prepare a simple snack while walking in the kitchen OT Short Term Goal 4 - Progress (Week 5): Not met OT Short Term Goal 5 (Week 5): xxx OT Short Term Goal 5 - Progress (Week 5): Other (comment)  Skilled Therapeutic Interventions/Progress Updates:    Patient received seated in wheelchair requesting to shower.  Patient walked to shower and sat on commode to remove clothing with intermittent cueing.  Patient frequently stops task performance and needs cueing to continue to carry on.  Cueing patient to begin to self reflect on her performance and generate simple solutions to problems.  Patient showered and then dressed while seated in regular chair.  Patient reporting itchy rash on thighs and torso - messaged nurse and MD.  Patient left up in wheelchair and set up for lunch.  Chair pad alarm in place and engaged and call bell/ personal items in reach.    Therapy Documentation Precautions:  Precautions Precautions: Fall Precaution Comments: decreased stand balance- reliant on UE, Pain in LLE limits mobility  at times Required Braces or Orthoses: Other Brace Other Brace: B adjustable night splints; B elbow cosey splints; L palm protector; R mitt Restrictions Weight Bearing Restrictions Per Provider Order: No   Pain: Pain Assessment Pain Scale: 0-10 Pain Score: 0-No pain     Therapy/Group: Individual Therapy  Freya Zobrist  M 05/24/2023, 12:30 PM

## 2023-05-24 NOTE — Progress Notes (Signed)
 Physical Therapy Session Note  Patient Details  Name: JOYCELYN [name] MRN: [id number] Date of Birth: 25-Dec-1975  Today's Date: 05/24/2023 PT Individual Time: 9084-9042 + 8684-8587 PT Individual Time Calculation (min): 42 min  + 57 min  Short Term Goals: Week 1:  PT Short Term Goal 1 (Week 1): Pt will perform supine<>sit with supervision PT Short Term Goal 1 - Progress (Week 1): Other (comment) (new goal) PT Short Term Goal 2 (Week 1): Pt will ambulate at least 168ft with no more than min A, not using an AD. PT Short Term Goal 2 - Progress (Week 1): Other (comment) (new goal) PT Short Term Goal 3 (Week 1): Pt will demonstrate decreased fall risk as noted by an improvement of at least 7 points on the [employer] Test PT Short Term Goal 3 - Progress (Week 1): Other (comment) (new goal) PT Short Term Goal 4 (Week 1): Pt will navigate 4 steps using HRs with no more than CGA  Skilled Therapeutic Interventions/Progress Updates:      1st session: Pt lying in bed and has no c/o pain. Wearing her hospital gown and requesting to change into regular clothes. Encouraged self selection to improve functional independence and decision making. Bed mobility completed with CGA. Sit<>Stand to RW with CGA and ambulates to her dresser where she selects clothes with minA for dynamic balance. She elects to change socks while sitting on the toilet (?) but then returns to EOB to finish UB/LB dressing. Questioned her decision making but patient reports it made the most sense. MinA for both UB/LB dressing while she sat EOB. She then completed self care tasks in standing at the sink - brushing teeth, combing hair without assist and supervision for standing.  Ambulated ~240f to main rehab gym. Focused remainder of session on fall recovery and floor transfers. Pt assisted safely onto the floor and positioned into supine position. Allowed ++ time for problem solving, motor planning, and organization during floor transfer  recovery. She completed x2 floor transfers - needing modA for returning to sitting EOM. She struggled with apraxia and sequencing the task. Will continue to work on this skill this PM session.  Ambulated back to her room with CGA and RW, ~164ft. Left sitting in wheelchair with chair pad alarm on and all needs met.     2nd session: Pt sitting in wheelchair and agreeable to therapy. Has no pain. Sit<>stand to RW with CGA - ambulates with CGA and RW to main rehab gym.   Continued to work on floor transfers from AM session. Pt completed x3 floor transfers in total - continued to require modA overall for getting from supine to sitting edge of mat table. Discussed alternative methods due to difficulty transitioning from long sitting to tall kneeling. Tried various methods but required modA for all. Will continue to address fall recovery and floor transfers. Noted some apraxia and difficulty with sequencing task.   Pt then participated in bowling with minA for dynamic standing balance. Bowling activity included picking up bowling pins from ground level in standing position - able to sustain a semi-squat position with minA overall. [place]; as she became fatigued towards end of session, required modA for stability. This activity also entailed ambulating on compliant mat to challenge balance and unlevel gait - min/modA for balance and steadying without UE support while navigating this terrain.   Finished session with pushing a tray table from main gym to day room gym ~137ft, with CGA and RW - tray table carrying  box of bowling ball and bowling pins for added resistance.   Returned to her room and she was left sitting upright with all her needs met.    Therapy Documentation Precautions:  Precautions Precautions: Fall Precaution Comments: decreased stand balance- reliant on UE, Pain in LLE limits mobility at times Required Braces or Orthoses: Other Brace Other Brace: B adjustable night splints; B elbow  cosey splints; L palm protector; R mitt Restrictions Weight Bearing Restrictions Per Provider Order: No General:     Therapy/Group: Individual Therapy  Patriciann Becht P Gabrella Stroh  PT, DPT, CSRS  05/24/2023, 7:46 AM

## 2023-05-24 NOTE — Plan of Care (Signed)
  Problem: Consults Goal: RH STROKE PATIENT EDUCATION Description: See Patient Education module for education specifics  Outcome: Progressing   Problem: RH BOWEL ELIMINATION Goal: RH STG MANAGE BOWEL WITH ASSISTANCE Description: STG Manage Bowel with mod I Assistance. Outcome: Progressing Goal: RH STG MANAGE BOWEL W/MEDICATION W/ASSISTANCE Description: STG Manage Bowel with Medication with mod I Assistance. Outcome: Progressing   Problem: RH BLADDER ELIMINATION Goal: RH STG MANAGE BLADDER WITH ASSISTANCE Description: STG Manage Bladder With toileting Assistance Outcome: Progressing   Problem: RH SKIN INTEGRITY Goal: RH STG MAINTAIN SKIN INTEGRITY WITH ASSISTANCE Description: STG Maintain Skin Integrity With min  Assistance. Outcome: Progressing

## 2023-05-24 NOTE — Evaluation (Signed)
 Speech Language Pathology Assessment and Plan  Patient Details  Name: Nicole [name] MRN: [id number] Date of Birth: 11-01-1975  SLP Diagnosis:   n/a Rehab Potential:  n/a ELOS:   n/a  Today's Date: 05/24/2023 SLP Individual Time: 9197-9152 SLP Individual Time Calculation (min): 45 min  Hospital Problem: Principal Problem:   Hypoxic brain injury (HCC) Active Problems:   Protein-calorie malnutrition, severe   Essential hypertension   Pressure injury of skin   History of cocaine abuse (HCC)   Anxiety with depression  Past Medical History:  Past Medical History:  Diagnosis Date   Abnormal pap    w LEEP   Alcoholism (HCC)    Allergy     Anxiety    Asthma    Asthma    Blood in stool    Depression    Depression (emotion)    Essential hypertension 01/29/2023   Frequent headaches    Hepatitis C, chronic (HCC)    hep c tx successful   Irritable bowel syndrome    OSA on CPAP - sees Dr. Herminio in ENT 10/09/2013   Seasonal allergies    scratch test in 2006///mold issue in house   Past Surgical History:  Past Surgical History:  Procedure Laterality Date   DILATION AND CURETTAGE OF UTERUS  1996, 2001, 2002   x 3   LEEP  2008   NASAL FRACTURE SURGERY  1999   repair   NASAL SEPTOPLASTY W/ TURBINOPLASTY     TONSILLECTOMY     Assessment / Plan / Recommendation Clinical Impression Nicole [name] is a 48 year old right-handed female with history of asthma as well as anxiety/depression, drug abuse. Per chart review patient lives with her mother. 1 level home with level entry. Family is planning assist as needed. Patient reportedly independent prior to admission. Presented 10/31/2022 after being found down in a field beside a condemned building. Patient unresponsive on arrival to the ED, hypoxic on 15 L nonrebreather mask. Required intubation for airway protection. Cranial CT scan showed abnormal symmetric edema with some bilateral globus paladi concerning for acute hypoxic/ischemic or  metabolic/toxic injury. No mass effect. CT cervical spine negative. MRI of the brain symmetric signal abnormality within the globi pallidi most consistent with toxic metabolic injury. 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 Small amount of bifrontal subarachnoid hemorrhage. EEG negative for seizure. CT of the chest abdomen pelvis showed right greater than left lower lobe consolidation with numerous associated centrilobular nodules and groundglass opacities. Trace right pleural effusion. No pneumothorax. Admission chemistries unremarkable except sodium 161 chloride 124 CO2 21 BUN 79 creatinine 2.57 AST 102 ALT 457 total bilirubin 2.2, WBC 43,600, lactic acid 4.4, CK3 109, BNP 140, urine drug screen positive cocaine, blood cultures no growth to date. Neurosurgery Dr. Carollee follow-up for diffuse cerebral edema in the setting of suspected acute hypoxic brain injury no neurosurgical intervention recommended. Infectious disease Dr. Dea consulted in regards to cavitary pneumonia and initially placed on broad-spectrum antibiotics since transitioned to Zyvox .. Leukocytosis much improved latest WBC 10,900 Lovenox  added for DVT prophylaxis. Echocardiogram showed no valvular vegetation and ejection fraction of 60 to 65%. TEE not recommended at this time.. Patient was extubated 6/23. Tolerating mechanical soft diet. AKI with multiple electrolyte abnormalities most likely combination of hypovolemia and ATN placed on gentle IV fluids with latest creatinine normalized 0.64. Therapy evaluations completed due to patient decreased functional mobility and altered mental status was admitted for a comprehensive rehab program.    Patient has made excellent progress during  rehab stay and has been on SLP caseload to target cognition and communication goals. Patient has been tolerating Dys3/thin liquid diet with no difficulty and demonstrates readiness cognitively to upgrade diet, thus bedside swallow  evaluation warranted to assess for diet upgrade potential.   Patient participated in trial tray of regular/thin liquids. Throughout meal, patient benefited from cognitive cues intermittently for adequate and efficient set-up as well as basic meal-based problem solving. Re: swallowing, Patient demonstrated no overt s/sx of penetration/aspiration across all textures, barring one instances of immediate strong cough while patient was drinking/talking at the same time. Patient independently stated she should not have been eating/talking simultaneously and reports that choking episodes do not frequently occur. Recommend regular/thin liquid diet with setup assist and intermittent supervision for cognitive assistance. Administer medications whole in puree or with thin liquid. No further SLP needs indicated for swallowing, though SLP will continue to follow for cognitive goals.  SLP also conducted cognitive therapy, guiding patient through various verbal problem solving tasks with patient benefiting from min-mod assist throughout to achieve 100% accuracy. SLP also provided patient with calendar to maintain orientation to date/time. Patient was left in lowered bed with call bell in reach and bed alarm set. SLP will continue to target goals per plan of care.     Skilled Therapeutic Interventions          SLP conducted skilled swallowing evaluation to assess for appropriateness to upgrade to a regular diet as well as conducted cognitive therapy. Full results and session events above.    SLP Assessment  Patient does not need any further Speech Lanaguage Pathology Services (*does not need any further Speech services for swallowing; will continue to follow for cognition)    Recommendations  SLP Diet Recommendations: Age appropriate regular solids;Thin Liquid Administration via: Straw;Cup Medication Administration: Whole meds with puree Supervision: Patient able to self feed;Intermittent supervision to cue for  compensatory strategies Compensations: Minimize environmental distractions;Small sips/bites;Slow rate Postural Changes and/or Swallow Maneuvers: Seated upright 90 degrees;Upright 30-60 min after meal Oral Care Recommendations: Oral care BID Recommendations for Other Services: Neuropsych consult Patient destination: Skilled Nursing Facility (SNF) Follow up Recommendations: Skilled Nursing facility;24 hour supervision/assistance Equipment Recommended: None recommended by SLP    SLP Frequency   N/a  SLP Duration  SLP Intensity  SLP Treatment/Interventions  N/a   N/a    N/a   Pain Pain Assessment Pain Scale: 0-10 Pain Score: 0-No pain  Prior Functioning Cognitive/Linguistic Baseline: Within functional limits Type of Home: House  Lives With: Family Available Help at Discharge: Family Education: a lot of college Vocation: Full time employment  Care Tool Care Tool Cognition Ability to hear (with hearing aid or hearing appliances if normally used Ability to hear (with hearing aid or hearing appliances if normally used): 0. Adequate - no difficulty in normal conservation, social interaction, listening to TV   Expression of Ideas and Wants Expression of Ideas and Wants: 3. Some difficulty - exhibits some difficulty with expressing needs and ideas (e.g, some words or finishing thoughts) or speech is not clear   Understanding Verbal and Non-Verbal Content Understanding Verbal and Non-Verbal Content: 3. Usually understands - understands most conversations, but misses some part/intent of message. Requires cues at times to understand  Memory/Recall Ability Memory/Recall Ability : That he or she is in a hospital/hospital unit;Staff names and faces;Location of own room;Current season   Bedside Swallowing Assessment General Date of Onset: 10/31/22 Previous Swallow Assessment: 11/15/22 Diet Prior to this Study: Dysphagia 3 (mechanical soft);Thin  liquids (Level 0) Temperature Spikes Noted:  No Respiratory Status: Room air History of Recent Intubation: No Behavior/Cognition: Alert;Lethargic/Drowsy;Doesn't follow directions Oral Cavity - Dentition: Adequate natural dentition Self-Feeding Abilities: Able to feed self;Needs set up Vision: Functional for self-feeding Patient Positioning: Upright in bed Baseline Vocal Quality: Normal Volitional Cough: Strong Volitional Swallow: Able to elicit  Oral Care Assessment Oral Assessment  (WDL): Within Defined Limits Lips: Symmetrical Teeth: Intact Tongue: Pink Mucous Membrane(s): Moist Saliva: Moist, saliva free flowing Level of Consciousness: Alert Is patient on any of following O2 devices?: None of the above Nutritional status: No high risk factors Oral Assessment Risk : Low Risk Ice Chips Ice chips: Not tested Thin Liquid Thin Liquid: Within functional limits Other Comments: one instance of coughing, suspect transient aspiration or penetration event however patient endorses this only happens when she is trying to eat and talk at the same time. Nectar Thick Nectar Thick Liquid: Not tested Honey Thick Honey Thick Liquid: Not tested Puree Puree: Within functional limits Presentation: Self Fed;Spoon Solid Solid: Within functional limits Presentation: Self Fed BSE Assessment Risk for Aspiration Impact on safety and function: Mild aspiration risk Other Related Risk Factors: Cognitive impairment  Recommendations for other services: Neuropsych  The above assessment, treatment plan, treatment alternatives and goals were discussed and mutually agreed upon: by patient  Etienne Mowers, M.A., CCC-SLP  Hulda Reddix A Mouna Yager 05/24/2023, 8:41 AM

## 2023-05-25 NOTE — Group Note (Signed)
 Patient Details Name: JENEFER WOERNER MRN: [id number] DOB: [birth year] Today's Date: 05/25/2023  Time Calculation: OT Group Time Calculation OT Group Start Time: 0900 OT Group Stop Time: 1000 OT Group Time Calculation (min): 60 min      Group Description: Dance Group: Pt participated in dance group with an emphasis on social interaction, motor planning, increasing overall activity tolerance and bimanual tasks. All songs were selected by group members. Dance moves included AROM of BUE/BLE gross motor movements with an emphasis on building functional endurance.    Individual level documentation: Patient completed group from sitting level. Patientt needed supervision to complete various dance moves with cues for motivation and L U/LE positioning.  Patient needed min modifications during group.  Pain:  0/10  Precautions:  Falls  LAFONDA PATRON 05/25/2023, 12:28 PM

## 2023-05-25 NOTE — Progress Notes (Signed)
 Occupational Therapy Session Note  Patient Details  Name: Nicole [name] MRN: [id number] Date of Birth: 04-21-1976  Today's Date: 05/25/2023 OT Individual Time: 9195-9154 OT Individual Time Calculation (min): 41 min    Short Term Goals: Week 6:  OT Short Term Goal 1 (Week 6): Patient will complete laundry with min assist OT Short Term Goal 1 - Progress (Week 6): Other (comment) OT Short Term Goal 2 (Week 6): Patient will prepare simple snack with min assist OT Short Term Goal 2 - Progress (Week 6): Other (comment) OT Short Term Goal 3 (Week 6): Patient will don underwear and  pants in less than 2 min OT Short Term Goal 3 - Progress (Week 6): Other (comment) OT Short Term Goal 4 (Week 6): Patient will self organize steps of basic self care with 25% less cueing OT Short Term Goal 4 - Progress (Week 6): Other (comment) OT Short Term Goal 5 (Week 6): xxx OT Short Term Goal 5 - Progress (Week 6): Other (comment)  Skilled Therapeutic Interventions/Progress Updates:    Patient received sidelying in bed eating breakfast.  Patient unable to reach tray for coffee.  Encouraged patient to sit at edge of bed to safely eat recently upgraded textures.  Patient excited to have pizza last night, and bacon this am.  Patient needing mod assist to transition to sitting from sidelying.  Patient able to feed herself sitting at edge of bed.  Walked to bathroom with walker and close supervision.  Attempted to void on toilet without success.  Multiple pairs of pants drying in bathroom soaked in urine.  Patient left alone in bathroom to shower to foster more independnece with self organizing shower task, and helping with attention to task.    Therapy Documentation Precautions:  Precautions Precautions: Fall Precaution Comments: decreased stand balance- reliant on UE, Pain in LLE limits mobility at times Required Braces or Orthoses: Other Brace Other Brace: B adjustable night splints; B elbow cosey splints; L palm  protector; R mitt Restrictions Weight Bearing Restrictions Per Provider Order: No   Pain:  Denies pain      Therapy/Group: Individual Therapy  Leahmarie Gasiorowski M 05/25/2023, 12:01 PM

## 2023-05-25 NOTE — NC FL2 (Signed)
 [place]  MEDICAID FL2 LEVEL OF CARE FORM     IDENTIFICATION  Patient Name: Nicole [name] Birthdate: 1976-03-03 Sex: female Admission Date (Current Location): 11/18/2022  [place] and [place] Number:  [place] [id number] S Facility and Address:  [facility], [address], [place], [place] [address]      Provider Number: [id number]  Attending Physician Name and Address:  Lorilee Sven SQUIBB, MD  Relative Name and Phone Number:  Heron Hailstone [phone]    Current Level of Care: Other (Comment) (Rehab) Recommended Level of Care: [facility] Prior Approval Number:    Date Approved/Denied:   PASRR Number: [id number]  Discharge Plan: Domiciliary (Rest home)    Current Diagnoses: Patient Active Problem List   Diagnosis Date Noted   History of cocaine abuse (HCC) 05/17/2023   Anxiety with depression 05/17/2023   Essential hypertension 01/29/2023   Pressure injury of skin 01/29/2023   Protein-calorie malnutrition, severe 01/05/2023   Hypoxic brain injury (HCC) 11/18/2022   Cavitary pneumonia 11/05/2022   Substance use 11/05/2022   Acute respiratory failure (HCC) 10/31/2022   Nonallergic rhinitis 08/23/2020   Mild persistent asthma without complication 06/14/2020   Other allergic rhinitis 06/14/2020   Adverse reaction to food, subsequent encounter 06/14/2020   IBS (irritable bowel syndrome) 05/24/2016   Chronic venous insufficiency 05/24/2016   Migraine 05/24/2016   Pulmonary nodules 10/27/2014   Hepatitis C, chronic (HCC)    Depression 10/09/2013   OSA on CPAP - sees Dr. Herminio in ENT 10/09/2013   Allergic rhinitis 05/30/2012   Cough variant asthma 02/08/2012    Orientation RESPIRATION BLADDER Height & Weight     Self, Situation, Place  Normal Incontinent (has urgnecy wears a depends) Weight: 127 lb 13.9 oz (58 kg) Height:  5' 1 (154.9 cm)  BEHAVIORAL SYMPTOMS/MOOD NEUROLOGICAL BOWEL NUTRITION STATUS      Continent  Diet (Dys 3 thin liquids)  AMBULATORY STATUS COMMUNICATION OF NEEDS Skin   Supervision Verbally Normal PU Stage 1 Dressing: Daily PU Stage 2 Dressing: Daily                   Personal Care Assistance Level of Assistance  Bathing, Dressing Bathing Assistance: Limited assistance Feeding assistance: Independent Dressing Assistance: Limited assistance Total Care Assistance: Maximum assistance   Functional Limitations Info  Speech Sight Info: Adequate Hearing Info: Adequate Speech Info: Impaired    SPECIAL CARE FACTORS FREQUENCY  PT (By licensed PT), OT (By licensed OT), Speech therapy     PT Frequency: 2-3 x week OT Frequency: 2-3 x week Bowel and Bladder Program Frequency: Timed toleting every 3-4 hours   Speech Therapy Frequency: 2-3 x week      Contractures Contractures Info: Not present    Additional Factors Info  Code Status, Allergies Code Status Info: DNR Allergies Info: Brazil nuts, cashews, ceclor and nickel     Isolation Precautions Info: Off MRSA precautions re-tested. Skin issues healed     Current Medications (05/25/2023):  This is the current hospital active medication list Current Facility-Administered Medications  Medication Dose Route Frequency Provider Last Rate Last Admin   acetaminophen  (TYLENOL ) tablet 650 mg  650 mg Oral Q6H PRN Angiulli, Daniel J, PA-C   650 mg at 05/16/23 0900   B-complex with vitamin C  tablet 1 tablet  1 tablet Oral Daily Raulkar, Krutika P, MD   1 tablet at 05/25/23 9271   bisacodyl  (DULCOLAX) EC tablet 5 mg  5 mg Oral Daily PRN Street, [place], PA-C  5 mg at 05/23/23 0901   bisacodyl  (DULCOLAX) suppository 10 mg  10 mg Rectal Daily PRN Street, [place], PA-C       busPIRone  (BUSPAR ) tablet 5 mg  5 mg Oral TID PRN Raulkar, Krutika P, MD   5 mg at 05/21/23 1840   camphor-menthol  (SARNA) lotion   Topical PRN Raulkar, Krutika P, MD   Given at 05/23/23 1923   cholecalciferol  (VITAMIN D3) 25 MCG (1000 UNIT) tablet 5,000 Units   5,000 Units Oral Daily Raulkar, Krutika P, MD   5,000 Units at 05/25/23 9271   gabapentin  (NEURONTIN ) capsule 100 mg  100 mg Oral QHS Lorilee Sven SQUIBB, MD   100 mg at 05/24/23 2123   gadobutrol  (GADAVIST ) 1 MMOL/ML injection 5 mL  5 mL Intravenous Once PRN Raulkar, Krutika P, MD       Gerhardt's butt cream   Topical BID Love, Pamela S, PA-C   Given at 05/25/23 9266   hydrocerin (EUCERIN) cream   Topical BID Raulkar, Krutika P, MD   Given at 05/24/23 1920   hydrocortisone  cream 1 %   Topical BID Raulkar, Krutika P, MD   Given at 05/25/23 0730   loratadine  (CLARITIN ) tablet 10 mg  10 mg Oral Daily PRN Raulkar, Krutika P, MD       magnesium  gluconate (MAGONATE) tablet 250 mg  250 mg Oral QHS Raulkar, Krutika P, MD   250 mg at 05/24/23 2123   melatonin tablet 3 mg  3 mg Oral QHS PRN Urbano Albright, MD   3 mg at 05/24/23 2123   selenium  sulfide (SELSUN ) 1 % shampoo   Topical PRN Raulkar, Krutika P, MD   Given at 01/17/23 2056   topiramate  (TOPAMAX ) tablet 25 mg  25 mg Oral Daily PRN Raulkar, Krutika P, MD   25 mg at 05/20/23 9355     Discharge Medications: Please see discharge summary for a list of discharge medications.  Relevant Imaging Results:  Relevant Lab Results:   Additional Information SSN: [id number]  Beena Catano, [place] MATSU, LCSW

## 2023-05-25 NOTE — Progress Notes (Signed)
 PROGRESS NOTE   Subjective/Complaints: No new complaints this morning About to work with group therapy Appreciate SW update Goals are now supervision    ROS: as per HPI. Denies CP, SOB, abd pain, N/V/D/C, pain or any other complaints at this time.      Objective:   No results found.   No results for input(s): WBC, HGB, HCT, PLT in the last 72 hours.         No results for input(s): NA, K, CL, CO2, GLUCOSE, BUN, CREATININE, CALCIUM  in the last 72 hours.          Intake/Output Summary (Last 24 hours) at 05/25/2023 1155 Last data filed at 05/25/2023 0800 Gross per 24 hour  Intake 820 ml  Output --  Net 820 ml           Physical Exam: Vital Signs Blood pressure 99/67, pulse 77, temperature 98.4 F (36.9 C), resp. rate 16, height 5' 1 (1.549 m), weight 58 kg, SpO2 97%. Constitutional: No distress . Vital signs reviewed. Resting in bed HEENT: NCAT, EOMI, oral membranes moist Neck: supple Cardiovascular: RRR without murmur. No JVD    Respiratory/Chest: CTA Bilaterally without wheezes or rales. Normal effort    GI/Abdomen: BS +, non-tender, non-distended Ext: no clubbing, cyanosis, or edema Psych: flat but cooperative  Skin: + Scaling dried skin on bilateral heels left buttock area no examined as she was up and dressed + Left heel stage III DTI has resolved Neuro: memory improving. Remembers staff and myself by name! Neurologic: AAOx2, fair insight and awareness.[name]  Phonation and speech intelligability much improved. Fair insight. MAS 1 right elbow,MAS  tr to 1 in RIght finger and wrist flexors  MAS 1 in extensors of LE's MAS 1 L elbow , MAS 1+ in L wrist and finger flexors. Hypersensitivity to left hand improved, Left sided strength improved, decreased range of motion and pain at end range of motion in left shoulder, stable 1/10  Musculoskeletal:some limitations in right  shoulder ROM     Assessment/Plan: 1. Functional deficits which require 3+ hours per day of interdisciplinary therapy in a comprehensive inpatient rehab setting. Physiatrist is providing close team supervision and 24 hour management of active medical problems listed below. Physiatrist and rehab team continue to assess barriers to discharge/monitor patient progress toward functional and medical goals  Care Tool:  Bathing    Body parts bathed by patient: Face, Abdomen, Chest, Left upper leg, Right upper leg, Left arm, Front perineal area, Right arm, Right lower leg, Left lower leg, Buttocks   Body parts bathed by helper: Buttocks Body parts n/a: Right arm, Left arm, Front perineal area, Buttocks, Right upper leg, Left upper leg, Right lower leg, Left lower leg   Bathing assist Assist Level: Supervision/Verbal cueing     Upper Body Dressing/Undressing Upper body dressing   What is the patient wearing?: Bra, Pull over shirt    Upper body assist Assist Level: Minimal Assistance - Patient > 75%    Lower Body Dressing/Undressing Lower body dressing      What is the patient wearing?: Underwear/pull up, Pants     Lower body assist Assist for lower body dressing: Minimal Assistance -  Patient > 75%     Toileting Toileting Toileting Activity did not occur Press Photographer and hygiene only): N/A (no void or bm)  Toileting assist Assist for toileting: Supervision/Verbal cueing     Transfers Chair/bed transfer  Transfers assist  Chair/bed transfer activity did not occur: Safety/medical concerns  Chair/bed transfer assist level: Supervision/Verbal cueing Chair/bed transfer assistive device: Walker, Archivist   Ambulation assist   Ambulation activity did not occur: Safety/medical concerns  Assist level: Contact Guard/Touching assist Assistive device: Walker-rolling Max distance: 300'   Walk 10 feet activity   Assist  Walk 10 feet activity  did not occur: Safety/medical concerns  Assist level: Contact Guard/Touching assist Assistive device: Walker-rolling   Walk 50 feet activity   Assist Walk 50 feet with 2 turns activity did not occur: Safety/medical concerns  Assist level: Contact Guard/Touching assist Assistive device: Walker-rolling    Walk 150 feet activity   Assist Walk 150 feet activity did not occur: Safety/medical concerns  Assist level: Contact Guard/Touching assist Assistive device: Walker-rolling    Walk 10 feet on uneven surface  activity   Assist Walk 10 feet on uneven surfaces activity did not occur: Safety/medical concerns   Assist level: Minimal Assistance - Patient > 75% Assistive device: Walker-rolling   Wheelchair     Assist Is the patient using a wheelchair?: No Type of Wheelchair: Manual    Wheelchair assist level: Moderate Assistance - Patient 50 - 74% Max wheelchair distance: 69ft    Wheelchair 50 feet with 2 turns activity    Assist    Wheelchair 50 feet with 2 turns activity did not occur: Safety/medical concerns   Assist Level: Supervision/Verbal cueing   Wheelchair 150 feet activity     Assist  Wheelchair 150 feet activity did not occur: Safety/medical concerns   Assist Level: Moderate Assistance - Patient 50 - 74%   Blood pressure 99/67, pulse 77, temperature 98.4 F (36.9 C), resp. rate 16, height 5' 1 (1.549 m), weight 58 kg, SpO2 97%.  Medical Problem List and Plan: 1. 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 Awaiting approval of disability. Placement pending. Hoping that hospital will provide LOG for  placement. Code status changed to full code after obtaining patient consent Continue CIR therapies including PT, OT, and SLP  -still waiting on SNF offers -discussed plan for disposition conference with patient's mom on Wednesday at 1pm  2.  Impaired mobility: d/c lovenox  since ambulating >150 feet, continue SCDs  3. Knee pain: Improved, decrease gabapentin  to 100mg  HS. Provided list of foods for pain. Tylenol  as needed, d/c voltaren  gel. XR reviewed and is stable, discussed with therapy -Right wrist swelling: XR ordered and shows no acute fracture, shows 2.29mm ulnar variance, brace removed to given patient a break from it. Voltaren  gel ordered prn for discomfort, continue, CT ordered and show no acute osseus injury, continue ace wrap   -Foot pain: tramadol  ordered prn, improved today, continue prn  -Headaches:continue tylenol  and tramadol  prn -Low back pain: continue kpad, asked [place]  to please bring for her -Whole body hypersensitivity: gabapentin  100mg  TID ordered -Pain in left heel: XR obtained and negative, cam boot ordered to  help offload heel with therapy, discussed that this has helped -R shoulder pain: XR neg for acute etiology on 04/06/23, voltaren  gel ordered--encourage ROM as tolerated 1/3-5 denying any pain   4. History of anxiety: discussed with her mom that she feels this was a big risk factor for her accident, propanolol d/ced due to hypotension. Continue magnesium  supplement. Asked SW to place her on neuropsych list, Buspar  ordered prn, discussed placing outpatient referral for behavioral health f/u, discussed that Buspar  has been helpful for her.  5. Neuropsych/cognition: This patient is not capable of making decisions on her own behalf  - 10/12: Still not capable of medical decision making; BUT oriented and communicating, following directions - MUCH improved - PRN melatonin 3mg    for insomnia  - Buspar  prn  6. Skin: -Buttock/sacrum--foam dressing, pressure relief,  nutrition -stage 3 left heel, continue foam dressing/ pressure relief  7. Fluids/Electrolytes/Nutrition: Routine in and outs with follow-up chemistries  -Eating  well with cueing   -Dysphagia: continue D3/thins--tolerating this diet -Low protein stores: d/c prosource since patient hates these and is eating 100% of meals -Suboptimal vitamin D  level: increase to 5,000 U daily. continue this dose  -History of magnesium  deficiency: magnesium  500mg  stopped to support BP  - good po intake  8.  Cavitary right lower lobe pneumonia likely aspiration pneumonia/MRSA pneumonia.  Resolved    Latest Ref Rng & Units 05/16/2023    6:28 AM 05/02/2023    5:20 AM 04/18/2023    5:16 AM  CBC  WBC 4.0 - 10.5 K/uL 6.2  5.9  6.0   Hemoglobin 12.0 - 15.0 g/dL 85.0  85.1  85.9   Hematocrit 36.0 - 46.0 % 42.1  43.0  41.6   Platelets 150 - 400 K/uL 300  241  194      9.  History of drug abuse.  Positive cocaine on urine drug screen.  Provide counseling  10.  AKI/hypovolemia and ATN.  Resolved     Latest Ref Rng & Units 05/16/2023    6:28 AM 05/02/2023    5:20 AM 04/18/2023    5:16 AM  BMP  Glucose 70 - 99 mg/dL 87  89  79   BUN 6 - 20 mg/dL 15  14  17    Creatinine 0.44 - 1.00 mg/dL 9.24  9.20  9.32   Sodium 135 - 145 mmol/L 136  138  137   Potassium 3.5 - 5.1 mmol/L 3.9  4.0  3.8   Chloride 98 - 111 mmol/L 105  109  105   CO2 22 - 32 mmol/L 22  23  23    Calcium  8.9 - 10.3 mg/dL 8.9  8.9  9.0     11.  Mild transaminitis with rhabdomyolysis.  Both resolved  12.  Hypotension:  D/c amantadine , d/cflomax, magneisum gluconate decreased to 250mg  HS  13. HTN: resolved   14. Impaired initiation: resolved, d/c amantadine   15. Hyperglycemia: CBGs mildly elevated, d/ced checks. Prosource d/ced as per patient's request  16. Spasticity:  Continue wrist, elbow brace, and PRAFOs. Ordered a night splint for LLE  D/c baclofen  and tizanidine - much improved  17.  Dyskinetic movements in head and neck - appears  to be myoclonic jerks related to hypoxic brain injury - as discussed with pt mom, no specific meds for this on antispasticity meds , consioder benzo although with pt's SUD would avoid this class - EEG reviewed and was negative for seizure.  -  resolved   18. Cervical extensor weakness: decrease  tizanidine  to 2mg  HS, decrease baclofen  to 5mg  QHS, improved, d/c'd  19. Bowel and bladder incontinence: continue bowel and bladder program. D/c miralax , decrease tizanidine  to 2mg  HS, d/c'd  20. Fatigue: resolved, discussed with team may be secondary to anti-spasticity medications, B12 reviewed and is 335, in suboptimal range, will give 1,000U B12 injection 8/7, will order monthly as needed, metanx started, increase to BID, continue, d/c tizanidine , discussed with patient, d/c baclofen    21. MRSA nares positive: negative on repeat, precautions d/ced  22. Tachycardic: resolved, d/c propanolol. Continue tizanidine  2mg  HS. Resolved, amantadine  increased back to 100mg  BID, continue this dose, resolved  23. S/p fall: CT head reviewed and is stable  24. Bilateral foot drop: bilateral AFOs ordered, discussed that these can help her to walk better  25. Vaginal itching: completed fluconazole . Having some discharge now. See below  26. Dysuria -treated twice for UTI's.   12/30- added Pyridium  100 mg TID x 2 days- to help dysuria. 12/31- no change in Dysuria- per nursing, has had since before Xmas- might be tear in mons/vaginal mucosa that's stinging when voids-   1/1- pt reports Burning gone is good- is done with ABX and pyridium - pt hates pyridium - freaks her out with color change.   1/3 see neurogenic bladder discussion above  27. Poor sitting balance: continue OT/PT, improving  28. Headaches: topamax  65m daily prn ordered, continue  29. Drowsiness with tramadol : d/ced  30. IUD in place: Pt states she has IUD x23yrs  and believes it's supposed to be removed soon(chart review- Dr. Lavoie placed  Mirena  IUD on 07/01/19, good for 27yrs); needs outpatient follow-up with OB/GYN  31. Constipation: d/c baclofen , last BM 12/18, milk of magnesia ordered 12/20, continue magnesium  gluconate   32. History of drug addiction: tramadol  d/ced as per mom's preference  33. Depression: discussed scheduling for outpatient psychology/psychiatry follow-up, discussed neuropsych f/u while in hospital  -appreciate Dr. Corina follow up  34. Sinusitis: claritin  ordered prn, continue  25. Pruritus: sarna lotion ordered, discussed that this is prn     LOS: 188 days A FACE TO FACE EVALUATION WAS PERFORMED  Sven P Darah Simkin 05/25/2023, 11:55 AM

## 2023-05-25 NOTE — Progress Notes (Addendum)
 Patient ID: Nicole [name], female   DOB: [birth year], 48 y.o.   MRN: [id number]  Have updated FL2 to is moving toward ALF-Family care home level of care where supervision is needed. Have sent out once again, but her barrier is her disability has not been approved and so no payment source besides her [employer] insurance. Pt does continue to progress and new goals are supervision level. Will need to talk with Mom regarding plan  12:05 Pm Met with Mom to discuss if she is available for a meeting with director and MD next Wed at 1;00. We ned to discuss discharge. Mom feels she is not able to provide the care pt needs. Will make [facility] referral and CAP referral.

## 2023-05-25 NOTE — Progress Notes (Signed)
 Physical Therapy Weekly Progress Note  Patient Details  Name: Nicole [name] MRN: [id number] Date of Birth: 1976/04/24  Beginning of progress report period: May 18, 2023 End of progress report period: May 25, 2023  Today's Date: 05/25/2023 PT Individual Time: 1000-1045 + [id number] PT Individual Time Calculation (min): 45 min  + 55 min  Ms. Bauder continues to make functional gains. Therapy is focusing on floor transfers, core strengthening, safety awareness training, and functional mobility. She's currently at a CGA to intermittent minA with the use of the RW for functional mobility.   Patient continues to demonstrate the following deficits muscle weakness and muscle joint tightness, decreased cardiorespiratoy endurance, abnormal tone, unbalanced muscle activation, and motor apraxia, decreased motor planning, decreased initiation, decreased attention, decreased awareness, decreased problem solving, decreased safety awareness, decreased memory, and delayed processing, and decreased sitting balance, decreased standing balance, decreased postural control, hemiplegia, and decreased balance strategies and therefore will continue to benefit from skilled PT intervention to increase functional independence with mobility.  Patient progressing toward long term goals..  Continue plan of care.  PT Short Term Goals Week 1:  PT Short Term Goal 1 (Week 1): Pt will perform supine<>sit with supervision PT Short Term Goal 1 - Progress (Week 1): Other (comment) (new goal) PT Short Term Goal 2 (Week 1): Pt will ambulate at least 160ft with no more than min A, not using an AD. PT Short Term Goal 2 - Progress (Week 1): Other (comment) (new goal) PT Short Term Goal 3 (Week 1): Pt will demonstrate decreased fall risk as noted by an improvement of at least 7 points on the [employer] Test PT Short Term Goal 3 - Progress (Week 1): Other (comment) (new goal) PT Short Term Goal 4 (Week 1): Pt will navigate 4 steps  using HRs with no more than CGA  Skilled Therapeutic Interventions/Progress Updates:      1st session: Pt finishing up with Dance Group - sitting in wheelchair and ready for therapy treatment. Has no c/o pain.   Transported to main rehab gym and assisted to mat table with CGA stand pivot transfer. Focused first part of session on continued floor transfer training.   MinA for safely lowering to the ground. Patient completed a total of x3 floor transfers. She progressed from requiring minA to only supervision (!!) and requiring mod directional cues. Continues to be apraxic and difficulty with thought organization. Dicussed calling for help, looking for bodily injuries, etc before trying to get off the floor.   Patient participated in higher level dynamic balance training in // bars - static standing on inverted bosu ball with minA for balance with BUE support. Challenged stability with removing BUE support by holding 1kg med ball - min/modA for standing balance. Finished with stir the pot using inverted bosu to challenge ankle/hip strategies and encourage multi-plane weight shifting.   Pt ambulated back to his room ~173ft by pushing her wheelchair - she needed minA for balance and steadying due to fast past speed and difficulty managing wheelchair - push's heavily through BUE result in wheely of the wheelchair as she ambulates with it - decreased safety awareness.   Pt requesting to lie down at end of treatment - left in bed with all needs met and her alarm on.     2nd session: Pt sitting in wheelchair - requesting to go outside to watch the snow fall. Sit<>stand to RW with CGA. Ambulates from CIR floor, downstairs (standing rest in elevator), with CGA and  RW - directional cues only. Ambulated outdoors at [facility] level - safety cues for awareness of unlevel terrain, throw mats, cracks in pavement, etc. Patient enjoyed snow fall - therapeutic for her. Returned indoors and challenged her in stair  navigation and cardiovascular endurance by going up x3 flights of stairs using 1 hand rail on her R and minA for balance and stability. No rest breaks for the x4 flights (navigated x20 steps per flight). Pt pleased and proud of herself. Pt assisted onto mat table and worked on [facility] for cervical extension, rotation, lateral flexion. Provided scalene, SCM, and trap stretching with bias for each. Then assisted in thoracic rotations/twists while seated EOM with PROM provided. Ended with lat and shoulder stretching with overhead reaching (PROM). Ambulated back to her room with supervision and RW - ended treatment sitting in wheelchair with all her needs met.      Therapy Documentation Precautions:  Precautions Precautions: Fall Precaution Comments: decreased stand balance- reliant on UE, Pain in LLE limits mobility at times Required Braces or Orthoses: Other Brace Other Brace: B adjustable night splints; B elbow cosey splints; L palm protector; R mitt Restrictions Weight Bearing Restrictions Per Provider Order: No General:     Therapy/Group: Individual Therapy  Leonilda Cozby P Kohner Orlick 05/25/2023, 7:45 AM

## 2023-05-25 NOTE — Progress Notes (Signed)
 Recreational Therapy Session Note  Patient Details  Name: Nicole [name] MRN: [id number] Date of Birth: 1976-05-09 Today's Date: 05/25/2023  Pain: no c/o Skilled Therapeutic Interventions/Progress Updates: Goal:  Pt will actively participated in 60 minute therapeutic dance group with supervision.  MET   Pt participated in dance group emphasizing activity tolerance, dynamic sitting balance, UE use/strengthening/stretching, safety awareness and socialization. Pt smiling, laughing and singing throughout the group. Pt participated at supervision level needing demonstration cues.  Therapy/Group: Group Therapy   Oveta Idris 05/25/2023, 11:50 AM

## 2023-05-26 NOTE — Plan of Care (Signed)
  Problem: Consults Goal: RH STROKE PATIENT EDUCATION Description: See Patient Education module for education specifics  Outcome: Progressing   Problem: RH BOWEL ELIMINATION Goal: RH STG MANAGE BOWEL WITH ASSISTANCE Description: STG Manage Bowel with mod I Assistance. Outcome: Progressing Goal: RH STG MANAGE BOWEL W/MEDICATION W/ASSISTANCE Description: STG Manage Bowel with Medication with mod I Assistance. Outcome: Progressing   Problem: RH BLADDER ELIMINATION Goal: RH STG MANAGE BLADDER WITH ASSISTANCE Description: STG Manage Bladder With toileting Assistance Outcome: Progressing   Problem: RH SKIN INTEGRITY Goal: RH STG MAINTAIN SKIN INTEGRITY WITH ASSISTANCE Description: STG Maintain Skin Integrity With min  Assistance. Outcome: Progressing

## 2023-05-26 NOTE — Progress Notes (Signed)
 PROGRESS NOTE   Subjective/Complaints:  Pt doing well, slept well, pain manageable today, LBM yesterday per pt but not documented, urinating ok, denies any other complaints or concerns today.     ROS: as per HPI. Denies CP, SOB, abd pain, N/V/D/C, pain or any other complaints at this time.      Objective:   No results found.   No results for input(s): WBC, HGB, HCT, PLT in the last 72 hours.         No results for input(s): NA, K, CL, CO2, GLUCOSE, BUN, CREATININE, CALCIUM  in the last 72 hours.          Intake/Output Summary (Last 24 hours) at 05/26/2023 1124 Last data filed at 05/26/2023 0700 Gross per 24 hour  Intake 700 ml  Output --  Net 700 ml           Physical Exam: Vital Signs Blood pressure 109/72, pulse 68, temperature 98.4 F (36.9 C), resp. rate 17, height 5' 1 (1.549 m), weight 58 kg, SpO2 98%.  Constitutional: No distress . Vital signs reviewed. Resting in bed HEENT: NCAT, EOMI, oral membranes moist Neck: supple Cardiovascular: RRR without murmur. No JVD    Respiratory/Chest: CTA Bilaterally without wheezes or rales. Normal effort    GI/Abdomen: BS +, non-tender, non-distended Ext: no clubbing, cyanosis, or edema Psych: pleasant and cooperative    PRIOR EXAMS: Skin: + Scaling dried skin on bilateral heels left buttock area no examined as she was up and dressed + Left heel stage III DTI has resolved Neuro: memory improving. Remembers staff and myself by name! Neurologic: AAOx2, fair insight and awareness.[name]  Phonation and speech intelligability much improved. Fair insight. MAS 1 right elbow,MAS  tr to 1 in RIght finger and wrist flexors  MAS 1 in extensors of LE's MAS 1 L elbow , MAS 1+ in L wrist and finger flexors. Hypersensitivity to left hand improved, Left sided strength improved, decreased range of motion and pain at end range of motion in left  shoulder, stable 1/10  Musculoskeletal:some limitations in right shoulder ROM     Assessment/Plan: 1. Functional deficits which require 3+ hours per day of interdisciplinary therapy in a comprehensive inpatient rehab setting. Physiatrist is providing close team supervision and 24 hour management of active medical problems listed below. Physiatrist and rehab team continue to assess barriers to discharge/monitor patient progress toward functional and medical goals  Care Tool:  Bathing    Body parts bathed by patient: Face, Abdomen, Chest, Left upper leg, Right upper leg, Left arm, Front perineal area, Right arm, Right lower leg, Left lower leg, Buttocks   Body parts bathed by helper: Buttocks Body parts n/a: Right arm, Left arm, Front perineal area, Buttocks, Right upper leg, Left upper leg, Right lower leg, Left lower leg   Bathing assist Assist Level: Supervision/Verbal cueing     Upper Body Dressing/Undressing Upper body dressing   What is the patient wearing?: Bra, Pull over shirt    Upper body assist Assist Level: Minimal Assistance - Patient > 75%    Lower Body Dressing/Undressing Lower body dressing      What is the patient wearing?: Underwear/pull up, Pants  Lower body assist Assist for lower body dressing: Minimal Assistance - Patient > 75%     Toileting Toileting Toileting Activity did not occur Press Photographer and hygiene only): N/A (no void or bm)  Toileting assist Assist for toileting: Supervision/Verbal cueing     Transfers Chair/bed transfer  Transfers assist  Chair/bed transfer activity did not occur: Safety/medical concerns  Chair/bed transfer assist level: Supervision/Verbal cueing Chair/bed transfer assistive device: Walker, Archivist   Ambulation assist   Ambulation activity did not occur: Safety/medical concerns  Assist level: Contact Guard/Touching assist Assistive device: Walker-rolling Max distance:  300'   Walk 10 feet activity   Assist  Walk 10 feet activity did not occur: Safety/medical concerns  Assist level: Contact Guard/Touching assist Assistive device: Walker-rolling   Walk 50 feet activity   Assist Walk 50 feet with 2 turns activity did not occur: Safety/medical concerns  Assist level: Contact Guard/Touching assist Assistive device: Walker-rolling    Walk 150 feet activity   Assist Walk 150 feet activity did not occur: Safety/medical concerns  Assist level: Contact Guard/Touching assist Assistive device: Walker-rolling    Walk 10 feet on uneven surface  activity   Assist Walk 10 feet on uneven surfaces activity did not occur: Safety/medical concerns   Assist level: Minimal Assistance - Patient > 75% Assistive device: Walker-rolling   Wheelchair     Assist Is the patient using a wheelchair?: No Type of Wheelchair: Manual    Wheelchair assist level: Moderate Assistance - Patient 50 - 74% Max wheelchair distance: 36ft    Wheelchair 50 feet with 2 turns activity    Assist    Wheelchair 50 feet with 2 turns activity did not occur: Safety/medical concerns   Assist Level: Supervision/Verbal cueing   Wheelchair 150 feet activity     Assist  Wheelchair 150 feet activity did not occur: Safety/medical concerns   Assist Level: Moderate Assistance - Patient 50 - 74%   Blood pressure 109/72, pulse 68, temperature 98.4 F (36.9 C), resp. rate 17, height 5' 1 (1.549 m), weight 58 kg, SpO2 98%.  Medical Problem List and Plan: 1. 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 Awaiting approval of disability.  Placement pending. Hoping that hospital will provide LOG for placement. Code status changed to full code after obtaining patient consent Continue CIR therapies including PT, OT, and SLP  -still waiting on SNF offers -discussed plan for disposition conference with patient's mom on Wednesday at 1pm  2.  Impaired mobility: d/c lovenox  since ambulating >150 feet, continue SCDs  3. Knee pain: Improved, decrease gabapentin  to 100mg  HS. Provided list of foods for pain. Tylenol  as needed, d/c voltaren  gel. XR reviewed and is stable, discussed with therapy -Right wrist swelling: XR ordered and shows no acute fracture, shows 2.6mm ulnar variance, brace removed to given patient a break from it. Voltaren  gel ordered prn for discomfort, continue, CT ordered and show no acute osseus injury, continue ace wrap   -Foot pain: tramadol  ordered prn, improved today, continue prn  -Headaches:continue tylenol  and tramadol  prn -Low back pain: continue kpad, asked [place]  to please bring for her -Whole body hypersensitivity: gabapentin  100mg  TID ordered -Pain  in left heel: XR obtained and negative, cam boot ordered to help offload heel with therapy, discussed that this has helped -R shoulder pain: XR neg for acute etiology on 04/06/23, voltaren  gel ordered--encourage ROM as tolerated 1/3-5 denying any pain   4. History of anxiety: discussed with her mom that she feels this was a big risk factor for her accident, propanolol d/ced due to hypotension. Continue magnesium  supplement. Asked SW to place her on neuropsych list, Buspar  ordered prn, discussed placing outpatient referral for behavioral health f/u, discussed that Buspar  has been helpful for her.  5. Neuropsych/cognition: This patient is not capable of making decisions on her own behalf  - 10/12: Still not capable of medical decision making; BUT oriented and communicating, following directions - MUCH improved - PRN melatonin 3mg    for insomnia  - Buspar  prn  6.  Skin: -Buttock/sacrum--foam dressing, pressure relief, nutrition -stage 3 left heel, continue foam dressing/ pressure relief  7. Fluids/Electrolytes/Nutrition: Routine in and outs with follow-up chemistries  -Eating  well with cueing   -Dysphagia: continue D3/thins--tolerating this diet -Low protein stores: d/c prosource since patient hates these and is eating 100% of meals -Suboptimal vitamin D  level: increase to 5,000 U daily. continue this dose  -History of magnesium  deficiency: magnesium  500mg  stopped to support BP  - good po intake  8.  Cavitary right lower lobe pneumonia likely aspiration pneumonia/MRSA pneumonia.  Resolved    Latest Ref Rng & Units 05/16/2023    6:28 AM 05/02/2023    5:20 AM 04/18/2023    5:16 AM  CBC  WBC 4.0 - 10.5 K/uL 6.2  5.9  6.0   Hemoglobin 12.0 - 15.0 g/dL 85.0  85.1  85.9   Hematocrit 36.0 - 46.0 % 42.1  43.0  41.6   Platelets 150 - 400 K/uL 300  241  194      9.  History of drug abuse.  Positive cocaine on urine drug screen.  Provide counseling  10.  AKI/hypovolemia and ATN.  Resolved     Latest Ref Rng & Units 05/16/2023    6:28 AM 05/02/2023    5:20 AM 04/18/2023    5:16 AM  BMP  Glucose 70 - 99 mg/dL 87  89  79   BUN 6 - 20 mg/dL 15  14  17    Creatinine 0.44 - 1.00 mg/dL 9.24  9.20  9.32   Sodium 135 - 145 mmol/L 136  138  137   Potassium 3.5 - 5.1 mmol/L 3.9  4.0  3.8   Chloride 98 - 111 mmol/L 105  109  105   CO2 22 - 32 mmol/L 22  23  23    Calcium  8.9 - 10.3 mg/dL 8.9  8.9  9.0     11.  Mild transaminitis with rhabdomyolysis.  Both resolved  12.  Hypotension:  D/c amantadine , d/cflomax, magneisum gluconate decreased to 250mg  HS -05/26/23 BPs mostly stable, always a little soft but this appears to be her baseline Vitals:   05/22/23 1250 05/22/23 1919 05/23/23 0418 05/23/23 1336  BP: 104/73 (!) 99/57 99/82 110/73   05/23/23 1933 05/24/23 0515 05/24/23 1429 05/24/23 1952  BP: 118/63 99/71 96/75  (!) 90/58   05/25/23 0602 05/25/23  1343 05/25/23 1932 05/26/23 0347  BP: 99/67 107/69 (!) 89/73 109/72    13. HTN: resolved   14. Impaired initiation: resolved, d/c amantadine   15. Hyperglycemia: CBGs mildly elevated, d/ced checks. Prosource d/ced as per patient's request  16. Spasticity:  Continue wrist, elbow  brace, and PRAFOs. Ordered a night splint for LLE  D/c baclofen  and tizanidine - much improved  17.  Dyskinetic movements in head and neck - appears to be myoclonic jerks related to hypoxic brain injury - as discussed with pt mom, no specific meds for this on antispasticity meds , consioder benzo although with pt's SUD would avoid this class - EEG reviewed and was negative for seizure.  -  resolved   18. Cervical extensor weakness: decrease tizanidine  to 2mg  HS, decrease baclofen  to 5mg  QHS, improved, d/c'd  19. Bowel and bladder incontinence: continue bowel and bladder program. D/c miralax , decrease tizanidine  to 2mg  HS, d/c'd -05/26/23 LBM yesterday per pt but not documented in 2 days, monitor closely  20. Fatigue: resolved, discussed with team may be secondary to anti-spasticity medications, B12 reviewed and is 335, in suboptimal range, will give 1,000U B12 injection 8/7, will order monthly as needed, metanx started, increase to BID, continue, d/c tizanidine , discussed with patient, d/c baclofen    21. MRSA nares positive: negative on repeat, precautions d/ced  22. Tachycardic: resolved, d/c propanolol. Continue tizanidine  2mg  HS. Resolved, amantadine  increased back to 100mg  BID, continue this dose, resolved  23. S/p fall: CT head reviewed and is stable  24. Bilateral foot drop: bilateral AFOs ordered, discussed that these can help her to walk better  25. Vaginal itching: completed fluconazole . Having some discharge now. See below  26. Dysuria -treated twice for UTI's.   12/30- added Pyridium  100 mg TID x 2 days- to help dysuria. 12/31- no change in Dysuria- per nursing, has had since before Xmas- might be  tear in mons/vaginal mucosa that's stinging when voids-   1/1- pt reports Burning gone is good- is done with ABX and pyridium - pt hates pyridium - freaks her out with color change.   1/3 see neurogenic bladder discussion above  27. Poor sitting balance: continue OT/PT, improving  28. Headaches: topamax  79m daily prn ordered, continue  29. Drowsiness with tramadol : d/ced  30. IUD in place: Pt states she has IUD x40yrs  and believes it's supposed to be removed soon(chart review- Dr. Lavoie placed Mirena  IUD on 07/01/19, good for 20yrs); needs outpatient follow-up with OB/GYN  31. Constipation: d/c baclofen , last BM 12/18, milk of magnesia ordered 12/20, continue magnesium  gluconate-- see #19 above   32. History of drug addiction: tramadol  d/ced as per mom's preference  33. Depression: discussed scheduling for outpatient psychology/psychiatry follow-up, discussed neuropsych f/u while in hospital  -appreciate Dr. Corina follow up  34. Sinusitis: claritin  ordered prn, continue  25. Pruritus: sarna lotion ordered, discussed that this is prn     LOS: 189 days A FACE TO FACE EVALUATION WAS PERFORMED  [address] 05/26/2023, 11:24 AM

## 2023-05-27 NOTE — Progress Notes (Signed)
 PROGRESS NOTE   Subjective/Complaints:  Pt doing well again today, slept well, denies pain, LBM 2d ago per pt (but looks like she had a BM yesterday), urinating ok, denies any other complaints or concerns today.     ROS: as per HPI. Denies CP, SOB, abd pain, N/V/D/C, pain or any other complaints at this time.      Objective:   No results found.   No results for input(s): WBC, HGB, HCT, PLT in the last 72 hours.         No results for input(s): NA, K, CL, CO2, GLUCOSE, BUN, CREATININE, CALCIUM  in the last 72 hours.          Intake/Output Summary (Last 24 hours) at 05/27/2023 1048 Last data filed at 05/27/2023 0936 Gross per 24 hour  Intake 420 ml  Output --  Net 420 ml           Physical Exam: Vital Signs Blood pressure 105/74, pulse 70, temperature 98.1 F (36.7 C), temperature source Oral, resp. rate 16, height 5' 1 (1.549 m), weight 58 kg, SpO2 98%.  Constitutional: No distress . Vital signs reviewed. Resting in bed HEENT: NCAT, EOMI, oral membranes moist Neck: supple Cardiovascular: RRR without murmur. No JVD    Respiratory/Chest: CTA Bilaterally without wheezes or rales. Normal effort    GI/Abdomen: BS +, non-tender, non-distended Ext: no clubbing, cyanosis, or edema Psych: pleasant and cooperative, cheerful   PRIOR EXAMS: Skin: + Scaling dried skin on bilateral heels left buttock area no examined as she was up and dressed + Left heel stage III DTI has resolved Neuro: memory improving. Remembers staff and myself by name! Neurologic: AAOx2, fair insight and awareness.[name]  Phonation and speech intelligability much improved. Fair insight. MAS 1 right elbow,MAS  tr to 1 in RIght finger and wrist flexors  MAS 1 in extensors of LE's MAS 1 L elbow , MAS 1+ in L wrist and finger flexors. Hypersensitivity to left hand improved, Left sided strength improved, decreased  range of motion and pain at end range of motion in left shoulder, stable 1/10  Musculoskeletal:some limitations in right shoulder ROM     Assessment/Plan: 1. Functional deficits which require 3+ hours per day of interdisciplinary therapy in a comprehensive inpatient rehab setting. Physiatrist is providing close team supervision and 24 hour management of active medical problems listed below. Physiatrist and rehab team continue to assess barriers to discharge/monitor patient progress toward functional and medical goals  Care Tool:  Bathing    Body parts bathed by patient: Face, Abdomen, Chest, Left upper leg, Right upper leg, Left arm, Front perineal area, Right arm, Right lower leg, Left lower leg, Buttocks   Body parts bathed by helper: Buttocks Body parts n/a: Right arm, Left arm, Front perineal area, Buttocks, Right upper leg, Left upper leg, Right lower leg, Left lower leg   Bathing assist Assist Level: Supervision/Verbal cueing     Upper Body Dressing/Undressing Upper body dressing   What is the patient wearing?: Bra, Pull over shirt    Upper body assist Assist Level: Minimal Assistance - Patient > 75%    Lower Body Dressing/Undressing Lower body dressing  What is the patient wearing?: Underwear/pull up, Pants     Lower body assist Assist for lower body dressing: Minimal Assistance - Patient > 75%     Toileting Toileting Toileting Activity did not occur (Clothing management and hygiene only): N/A (no void or bm)  Toileting assist Assist for toileting: Supervision/Verbal cueing     Transfers Chair/bed transfer  Transfers assist  Chair/bed transfer activity did not occur: Safety/medical concerns  Chair/bed transfer assist level: Supervision/Verbal cueing Chair/bed transfer assistive device: Walker, Archivist   Ambulation assist   Ambulation activity did not occur: Safety/medical concerns  Assist level: Contact Guard/Touching  assist Assistive device: Walker-rolling Max distance: 300'   Walk 10 feet activity   Assist  Walk 10 feet activity did not occur: Safety/medical concerns  Assist level: Contact Guard/Touching assist Assistive device: Walker-rolling   Walk 50 feet activity   Assist Walk 50 feet with 2 turns activity did not occur: Safety/medical concerns  Assist level: Contact Guard/Touching assist Assistive device: Walker-rolling    Walk 150 feet activity   Assist Walk 150 feet activity did not occur: Safety/medical concerns  Assist level: Contact Guard/Touching assist Assistive device: Walker-rolling    Walk 10 feet on uneven surface  activity   Assist Walk 10 feet on uneven surfaces activity did not occur: Safety/medical concerns   Assist level: Minimal Assistance - Patient > 75% Assistive device: Walker-rolling   Wheelchair     Assist Is the patient using a wheelchair?: No Type of Wheelchair: Manual    Wheelchair assist level: Moderate Assistance - Patient 50 - 74% Max wheelchair distance: 8ft    Wheelchair 50 feet with 2 turns activity    Assist    Wheelchair 50 feet with 2 turns activity did not occur: Safety/medical concerns   Assist Level: Supervision/Verbal cueing   Wheelchair 150 feet activity     Assist  Wheelchair 150 feet activity did not occur: Safety/medical concerns   Assist Level: Moderate Assistance - Patient 50 - 74%   Blood pressure 105/74, pulse 70, temperature 98.1 F (36.7 C), temperature source Oral, resp. rate 16, height 5' 1 (1.549 m), weight 58 kg, SpO2 98%.  Medical Problem List and Plan: 1. 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 Awaiting approval of disability. Placement pending. Hoping that hospital will provide LOG for placement. Code status changed to full code after obtaining patient consent Continue CIR therapies including PT, OT, and SLP  -still waiting on SNF offers -discussed plan for disposition conference with patient's mom on Wednesday at 1pm  2.  Impaired mobility: d/c lovenox  since ambulating >150 feet, continue SCDs  3. Knee pain: Improved, decrease gabapentin  to 100mg  HS. Provided list of foods for pain. Tylenol  as needed, d/c voltaren  gel. XR reviewed and is stable, discussed with therapy -Right wrist swelling: XR ordered and shows no acute fracture, shows 2.27mm ulnar variance, brace removed to given patient a break from it. Voltaren  gel ordered prn for discomfort, continue, CT ordered and show no acute osseus injury, continue ace wrap   -Foot pain: tramadol  ordered prn, improved today, continue prn  -Headaches:continue tylenol  and tramadol  prn -Low back pain: continue kpad,  asked [place]  to please bring for her -Whole body hypersensitivity: gabapentin  100mg  TID ordered -Pain in left heel: XR obtained and negative, cam boot ordered to help offload heel with therapy, discussed that this has helped -R shoulder pain: XR neg for acute etiology on 04/06/23, voltaren  gel ordered--encourage ROM as tolerated -05/27/23 denying any pain   4. History of anxiety: discussed with her mom that she feels this was a big risk factor for her accident, propanolol d/ced due to hypotension. Continue magnesium  supplement. Asked SW to place her on neuropsych list, Buspar  ordered prn, discussed placing outpatient referral for behavioral health f/u, discussed that Buspar  has been helpful for her.  5. Neuropsych/cognition: This patient is not capable of making decisions on her own behalf  - 10/12: Still not capable of medical decision making; BUT oriented and communicating,  following directions - MUCH improved - PRN melatonin 3mg    for insomnia  - Buspar  prn  6. Skin: -Buttock/sacrum--foam dressing, pressure relief, nutrition -stage 3 left heel, continue foam dressing/ pressure relief  7. Fluids/Electrolytes/Nutrition: Routine in and outs with follow-up chemistries  -Eating  well with cueing   -Dysphagia: continue D3/thins--tolerating this diet -Low protein stores: d/c prosource since patient hates these and is eating 100% of meals -Suboptimal vitamin D  level: increase to 5,000 U daily. continue this dose  -History of magnesium  deficiency: magnesium  500mg  stopped to support BP  - good po intake  8.  Cavitary right lower lobe pneumonia likely aspiration pneumonia/MRSA pneumonia.  Resolved    Latest Ref Rng & Units 05/16/2023    6:28 AM 05/02/2023    5:20 AM 04/18/2023    5:16 AM  CBC  WBC 4.0 - 10.5 K/uL 6.2  5.9  6.0   Hemoglobin 12.0 - 15.0 g/dL 85.0  85.1  85.9   Hematocrit 36.0 - 46.0 % 42.1  43.0  41.6   Platelets 150 - 400 K/uL 300  241  194      9.  History of drug abuse.  Positive cocaine on urine drug screen.  Provide counseling  10.  AKI/hypovolemia and ATN.  Resolved     Latest Ref Rng & Units 05/16/2023    6:28 AM 05/02/2023    5:20 AM 04/18/2023    5:16 AM  BMP  Glucose 70 - 99 mg/dL 87  89  79   BUN 6 - 20 mg/dL 15  14  17    Creatinine 0.44 - 1.00 mg/dL 9.24  9.20  9.32   Sodium 135 - 145 mmol/L 136  138  137   Potassium 3.5 - 5.1 mmol/L 3.9  4.0  3.8   Chloride 98 - 111 mmol/L 105  109  105   CO2 22 - 32 mmol/L 22  23  23    Calcium  8.9 - 10.3 mg/dL 8.9  8.9  9.0     11.  Mild transaminitis with rhabdomyolysis.  Both resolved  12.  Hypotension:  D/c amantadine , d/cflomax, magneisum gluconate decreased to 250mg  HS -1/11-12/25 BPs mostly stable, always a little soft but this appears to be her baseline Vitals:   05/23/23 1336 05/23/23 1933 05/24/23 0515 05/24/23 1429  BP: 110/73 118/63 99/71 96/75    05/24/23 1952 05/25/23 0602  05/25/23 1343 05/25/23 1932  BP: (!) 90/58 99/67 107/69 (!) 89/73   05/26/23 0347 05/26/23 1351 05/26/23 2000 05/27/23 0508  BP: 109/72 95/65 108/69 105/74    13. HTN: resolved   14. Impaired initiation: resolved, d/c amantadine   15. Hyperglycemia: CBGs mildly elevated, d/ced  checks. Prosource d/ced as per patient's request  16. Spasticity:  Continue wrist, elbow brace, and PRAFOs. Ordered a night splint for LLE  D/c baclofen  and tizanidine - much improved  17.  Dyskinetic movements in head and neck - appears to be myoclonic jerks related to hypoxic brain injury - as discussed with pt mom, no specific meds for this on antispasticity meds , consioder benzo although with pt's SUD would avoid this class - EEG reviewed and was negative for seizure.  -  resolved   18. Cervical extensor weakness: decrease tizanidine  to 2mg  HS, decrease baclofen  to 5mg  QHS, improved, d/c'd  19. Bowel and bladder incontinence: continue bowel and bladder program. D/c miralax , decrease tizanidine  to 2mg  HS, d/c'd -05/26/23 LBM yesterday per pt but not documented in 2 days, monitor closely -05/27/23 LBM yesterday  20. Fatigue: resolved, discussed with team may be secondary to anti-spasticity medications, B12 reviewed and is 335, in suboptimal range, will give 1,000U B12 injection 8/7, will order monthly as needed, metanx started, increase to BID, continue, d/c tizanidine , discussed with patient, d/c baclofen    21. MRSA nares positive: negative on repeat, precautions d/ced  22. Tachycardic: resolved, d/c propanolol. Continue tizanidine  2mg  HS. Resolved, amantadine  increased back to 100mg  BID, continue this dose, resolved  23. S/p fall: CT head reviewed and is stable  24. Bilateral foot drop: bilateral AFOs ordered, discussed that these can help her to walk better  25. Vaginal itching: completed fluconazole . Having some discharge now. See below  26. Dysuria -treated twice for UTI's.   12/30- added Pyridium   100 mg TID x 2 days- to help dysuria. 12/31- no change in Dysuria- per nursing, has had since before Xmas- might be tear in mons/vaginal mucosa that's stinging when voids-   1/1- pt reports Burning gone is good- is done with ABX and pyridium - pt hates pyridium - freaks her out with color change.   1/3 see neurogenic bladder discussion above  27. Poor sitting balance: continue OT/PT, improving  28. Headaches: topamax  39m daily prn ordered, continue  29. Drowsiness with tramadol : d/ced  30. IUD in place: Pt states she has IUD x77yrs  and believes it's supposed to be removed soon(chart review- Dr. Lavoie placed Mirena  IUD on 07/01/19, good for 27yrs); needs outpatient follow-up with OB/GYN  31. Constipation: d/c baclofen , last BM 12/18, milk of magnesia ordered 12/20, continue magnesium  gluconate-- see #19 above   32. History of drug addiction: tramadol  d/ced as per mom's preference  33. Depression: discussed scheduling for outpatient psychology/psychiatry follow-up, discussed neuropsych f/u while in hospital  -appreciate Dr. Corina follow up  34. Sinusitis: claritin  ordered prn, continue  25. Pruritus: sarna lotion ordered, discussed that this is prn     LOS: 190 days A FACE TO FACE EVALUATION WAS PERFORMED  [address] 05/27/2023, 10:48 AM

## 2023-05-27 NOTE — Plan of Care (Signed)
  Problem: Consults Goal: RH STROKE PATIENT EDUCATION Description: See Patient Education module for education specifics  Outcome: Progressing   Problem: RH BOWEL ELIMINATION Goal: RH STG MANAGE BOWEL WITH ASSISTANCE Description: STG Manage Bowel with mod I Assistance. Outcome: Progressing Goal: RH STG MANAGE BOWEL W/MEDICATION W/ASSISTANCE Description: STG Manage Bowel with Medication with mod I Assistance. Outcome: Progressing   Problem: RH BLADDER ELIMINATION Goal: RH STG MANAGE BLADDER WITH ASSISTANCE Description: STG Manage Bladder With toileting Assistance Outcome: Progressing   Problem: RH SKIN INTEGRITY Goal: RH STG MAINTAIN SKIN INTEGRITY WITH ASSISTANCE Description: STG Maintain Skin Integrity With min  Assistance. Outcome: Progressing

## 2023-05-27 NOTE — Progress Notes (Signed)
 Slept good. SCD's applied at HS. PRN buspar  and melatonin given at 2018, per patient's request. Calls to go to BR, but usually incontinent before getting OOB. Reports she's not wearing any of the splints. Left heel with small rough area, use to be a wound. Discolored area to sacrum, old wound, gerhardt's butt cream applied. Declines need to use K-pad. Telesitter in place for safety, no attempts OOB thus far this shift. Nicole [name] A

## 2023-05-27 NOTE — Progress Notes (Signed)
 Occupational Therapy Session Note  Patient Details  Name: Nicole [name] MRN: [id number] Date of Birth: Feb 25, 1976  Today's Date: 05/28/2023 OT Individual Time: 1420-1530 OT Individual Time Calculation (min): 70 min    Short Term Goals: Week 25:  OT Short Term Goal 1: Patient will complete laundry with min assist OT Short Term Goal 2: Patient will prepare simple snack with min assist OT Short Term Goal 3: Patient will don underwear and  pants in less than 2 min OT Short Term Goal 4: Patient will self organize steps of basic self care with 25% less cueing   Skilled Therapeutic Interventions/Progress Updates:    Patient agreeable to participate in OT session. Reports intermittent shoulder pain with left greater than right.   Pt completed functional mobility while navigating within room, bathroom, and hallway utilizing RW with Min A.  Palpated max fascial restrictions in left bicep tendon and pectoralis muscle with Min fascial restrictions noted on right upper trapezius and AC joint.  - Myofascial release completed to BUE upper arm, upper trapezious and scapularis region to decrease fascial restrictions and increase joint mobility in a pain free zone.  - Muscle energy technique completed to Left shoulder flexors in order to relax tone and muscle spasm and improve range of motion.  - P/ROM, supine, shoulder flexion, abduction, IR/er, horizontal abductions, 5X, BUE, 1 set.  - Supine,BUE, 3# dowel rod, chest press, shoulder flexion, horizontal abduction/adduction, 15X, 1 set.  - Seated, proximal shoulder strengthening holding small red therapy ball, chest press, and overhead press utilizing vertical surface for visual target, 15X, 1 set.   Pt completed standing functional reaching task utilizing Squigz requiring her to perform a posterior lateral reach to retrieve each squigz with her left UE prior to placing on vertical surface above shoulder level. To remove squigz, pt was positioned at least  arms reach away to encourage full elbow extension (or close to it) while removing each Squigz from window with left UE.   At end of session pt utilized RW and CGA then SBA to navigate from day room to pt's room.        Therapy Documentation Precautions:  Precautions Precautions: Fall Precaution Comments: decreased stand balance- reliant on UE, Pain in LLE limits mobility at times Required Braces or Orthoses: Other Brace Other Brace: B adjustable night splints; B elbow cosey splints; L palm protector; R mitt Restrictions Weight Bearing Restrictions Per Provider Order: No  Therapy/Group: Individual Therapy  Leita Howell, OTR/L,CBIS  Supplemental OT - [facility] and [facility] Secure Chat Preferred   05/28/2023, 7:19 AM

## 2023-05-28 NOTE — Progress Notes (Signed)
 Occupational Therapy Session Note  Patient Details  Name: Nicole [name] MRN: [id number] Date of Birth: 15-Jun-1975  Today's Date: 05/28/2023 OT Individual Time: 1017-1100 OT Individual Time Calculation (min): 43 min    Short Term Goals: Week 5:  OT Short Term Goal 1 (Week 5): Patient will grab hair to place in ponytail with BUE OT Short Term Goal 1 - Progress (Week 5): Partly met OT Short Term Goal 2 (Week 5): Patient will trial wearing underwear during weekday x 2 OT Short Term Goal 2 - Progress (Week 5): Not met OT Short Term Goal 3 (Week 5): Patient will complete laundry with min assist. OT Short Term Goal 3 - Progress (Week 5): Progressing toward goal OT Short Term Goal 4 (Week 5): Patient will prepare a simple snack while walking in the kitchen OT Short Term Goal 4 - Progress (Week 5): Not met OT Short Term Goal 5 (Week 5): xxx OT Short Term Goal 5 - Progress (Week 5): Other (comment)  Skilled Therapeutic Interventions/Progress Updates:   Patient received seated in wheelchair eager for shower.  Walked to bathroom for continent void on commode.  Patient able to complete hygiene and remove pants with min assist.  Walked to shower seat and patient completed shower with intermittent cueing.  Patient able to don bra - adapted method today - with cueing!  Able to don pants with close supervision and cueing, still needs assist to get left foot all the way into left shoe.  Patient able to apply ponytail holder to ponytail for the first time.  Apraxia, and muscle tension continue to impeded safe independent performance of BADL.   Left up in wheelchair at end of session with chair pad alarm in place and engaged and call bell/ personal items in reach.    Therapy Documentation Precautions:  Precautions Precautions: Fall Precaution Comments: decreased stand balance- reliant on UE, Pain in LLE limits mobility at times Required Braces or Orthoses: Other Brace Other Brace: B adjustable night  splints; B elbow cosey splints; L palm protector; R mitt Restrictions Weight Bearing Restrictions Per Provider Order: No   Pain: Pain Assessment Pain Scale: 0-10 Pain Score: 0-No pain     Therapy/Group: Individual Therapy  [name] M 05/28/2023, 11:54 AM

## 2023-05-28 NOTE — Progress Notes (Signed)
 Physical Therapy Session Note  Patient Details  Name: MONETTA LICK MRN: [id number] Date of Birth: [birth year]  Today's Date: 05/28/2023 PT Individual Time: 0800-0858 PT Individual Time Calculation (min): 58 min   Short Term Goals: Week 1:  PT Short Term Goal 1 (Week 1): Pt will perform supine<>sit with supervision PT Short Term Goal 1 - Progress (Week 1): Other (comment) (new goal) PT Short Term Goal 2 (Week 1): Pt will ambulate at least 173ft with no more than min A, not using an AD. PT Short Term Goal 2 - Progress (Week 1): Other (comment) (new goal) PT Short Term Goal 3 (Week 1): Pt will demonstrate decreased fall risk as noted by an improvement of at least 7 points on the [employer] Test PT Short Term Goal 3 - Progress (Week 1): Other (comment) (new goal) PT Short Term Goal 4 (Week 1): Pt will navigate 4 steps using HRs with no more than CGA  Skilled Therapeutic Interventions/Progress Updates:      Pt lying in bed and agreeable to therapy. Has no c/o pain, requests to get dressed.   Supine<>sitting from flat bed with supervision and no use of bed rails. Sit<>stand with CGA and no AD - ambulated in room with no AD at minA level. Patient selected clothes to wear from drawer - directional and safety cues for sequencing dressing tasks. She was able to complete UB/LB dressing with cues only. Donned tennis shoes with minA. Completed oral care, hair brushing, and washing her face while standing at the sink with SBA for safety. Patient with some motor planning deficits, difficulty recalling steps and recalling her next plan.   Pt ambulated from her room to main rehab gym with no AD at minA level - cues for pacing as she tends to rush herself. L knee hyperextension in stance as she becomes fatigued, decreased stance time on L as well.   Focused remainder of session on continued floor transfer training. She completed a total of x4 floor transfers. She required modA for the first 2 trials and minA  for the last 2. She is mostly limited by joint tightness and motor apraxia for floor transfers. She struggles with going from supine to prone and then prone to quadruped. Once in quadruped, able to transition to 1/2 kneeling with CGA. Allowed time for motor planning, sequencing, and thought organization. She was timed for the last trial and she completed in 15 seconds!!!  Pt ambulated back to her room with similar assist and cues. Ended treatment sitting in wheelchair with all needs met - aware of her upcoming therapy schedule.  Therapy Documentation Precautions:  Precautions Precautions: Fall Precaution Comments: decreased stand balance- reliant on UE, Pain in LLE limits mobility at times Required Braces or Orthoses: Other Brace Other Brace: B adjustable night splints; B elbow cosey splints; L palm protector; R mitt Restrictions Weight Bearing Restrictions Per Provider Order: No General:    Therapy/Group: Individual Therapy  Priest Lockridge P Halim Surrette  PT, DPT, CSRS 05/28/2023, 7:49 AM

## 2023-05-28 NOTE — Progress Notes (Signed)
 Physical Therapy Session Note  Patient Details  Name: Nicole [name] MRN: [id number] Date of Birth: 02-11-1976  Today's Date: 05/28/2023 PT Individual Time: 1330-1400 PT Individual Time Calculation (min): 30 min   Short Term Goals: Week 1:  PT Short Term Goal 1 (Week 1): Pt will perform supine<>sit with supervision PT Short Term Goal 1 - Progress (Week 1): Other (comment) (new goal) PT Short Term Goal 2 (Week 1): Pt will ambulate at least 158ft with no more than min A, not using an AD. PT Short Term Goal 2 - Progress (Week 1): Other (comment) (new goal) PT Short Term Goal 3 (Week 1): Pt will demonstrate decreased fall risk as noted by an improvement of at least 7 points on the [employer] Test PT Short Term Goal 3 - Progress (Week 1): Other (comment) (new goal) PT Short Term Goal 4 (Week 1): Pt will navigate 4 steps using HRs with no more than CGA  Skilled Therapeutic Interventions/Progress Updates:   Pt eager for session. Functional gait training with RW to and from therapy gyms and throughout unit > 150' with overall CGA - cues for safe positioning and placement of RW at times due to impulsivity and tending to lift up RW than pushing it. Antalgic gait pattern at times despite denying pain and tendency for R bias for stance/lean.   NMR in standing on [employer] (with uneven footplates to challenge balance) while performing functional reaching tasks with squiggz with focus on multitasking activity to maintain balance, select squigggz based on therapist naming a certain color and to slow down to place into the basket vs just rushing through task. Pt difficulty with coordinating all demands but with mod cues could self correct. Repeated x 3 trials.  Gait training without AD for challenge of balance with focus on normalizing gait pattern and overall safety due to impulsivity with tendency for LOB to the R in a busy environment x 90' with overall min assist.  Focused on transitional movements seat >  supine > prone > prone on elbows > quadruped. Pt requires cues for motor planning and extra time for problem solving how to get back out of position once we got into the positions. Performed lumbar stretch in prone on elbows and transitioned to child's pose and then quadruped. Engaged in single extremity raises (UE and then LE) to challenge core/trunk control/balance and coordination with overall min assist. Difficulty with controlled movement to be able to achieve alternating UE/LE.   Gait back to room as described above and all needs in reach.  Therapy Documentation Precautions:  Precautions Precautions: Fall Precaution Comments: decreased stand balance- reliant on UE, Pain in LLE limits mobility at times Required Braces or Orthoses: Other Brace Other Brace: B adjustable night splints; B elbow cosey splints; L palm protector; R mitt Restrictions Weight Bearing Restrictions Per Provider Order: No  Pain: Denies pain.   Therapy/Group: Individual Therapy  [name] [name], PT, DPT, CBIS  05/28/2023, 2:20 PM

## 2023-05-28 NOTE — Progress Notes (Signed)
 PROGRESS NOTE   Subjective/Complaints: No new complaints this morning Denies pain Tolerated therapy well today MinA level    ROS: as per HPI. Denies CP, SOB, abd pain, N/V/D/C, pain or any other complaints at this time.      Objective:   No results found.   No results for input(s): WBC, HGB, HCT, PLT in the last 72 hours.         No results for input(s): NA, K, CL, CO2, GLUCOSE, BUN, CREATININE, CALCIUM  in the last 72 hours.          Intake/Output Summary (Last 24 hours) at 05/28/2023 1003 Last data filed at 05/27/2023 2100 Gross per 24 hour  Intake 600 ml  Output --  Net 600 ml           Physical Exam: Vital Signs Blood pressure 103/64, pulse 77, temperature 98.4 F (36.9 C), resp. rate 16, height 5' 1 (1.549 m), weight 58 kg, SpO2 99%.  Constitutional: No distress . Vital signs reviewed. Resting in bed HEENT: NCAT, EOMI, oral membranes moist Neck: supple Cardiovascular: RRR without murmur. No JVD    Respiratory/Chest: CTA Bilaterally without wheezes or rales. Normal effort    GI/Abdomen: BS +, non-tender, non-distended Ext: no clubbing, cyanosis, or edema Psych: pleasant and cooperative, cheerful Skin: + Scaling dried skin on bilateral heels left buttock area no examined as she was up and dressed + Left heel stage III DTI has resolved Neuro: memory improving. Remembers staff and myself by name! Neurologic: AAOx2, fair insight and awareness.Nicole [name]  Phonation and speech intelligability much improved. Fair insight. MAS 1 right elbow,MAS  tr to 1 in RIght finger and wrist flexors  MAS 1 in extensors of LE's MAS 1 L elbow , MAS 1+ in L wrist and finger flexors. Hypersensitivity to left hand improved, Left sided strength improved, decreased range of motion and pain at end range of motion in left shoulder, stable 1/13  Musculoskeletal:some limitations in right shoulder  ROM     Assessment/Plan: 1. Functional deficits which require 3+ hours per day of interdisciplinary therapy in a comprehensive inpatient rehab setting. Physiatrist is providing close team supervision and 24 hour management of active medical problems listed below. Physiatrist and rehab team continue to assess barriers to discharge/monitor patient progress toward functional and medical goals  Care Tool:  Bathing    Body parts bathed by patient: Face, Abdomen, Chest, Left upper leg, Right upper leg, Left arm, Front perineal area, Right arm, Right lower leg, Left lower leg, Buttocks   Body parts bathed by helper: Buttocks Body parts n/a: Right arm, Left arm, Front perineal area, Buttocks, Right upper leg, Left upper leg, Right lower leg, Left lower leg   Bathing assist Assist Level: Supervision/Verbal cueing     Upper Body Dressing/Undressing Upper body dressing   What is the patient wearing?: Bra, Pull over shirt    Upper body assist Assist Level: Minimal Assistance - Patient > 75%    Lower Body Dressing/Undressing Lower body dressing      What is the patient wearing?: Underwear/pull up, Pants     Lower body assist Assist for lower body dressing: Minimal Assistance - Patient > 75%  Toileting Toileting Toileting Activity did not occur Press Photographer and hygiene only): N/A (no void or bm)  Toileting assist Assist for toileting: Supervision/Verbal cueing     Transfers Chair/bed transfer  Transfers assist  Chair/bed transfer activity did not occur: Safety/medical concerns  Chair/bed transfer assist level: Supervision/Verbal cueing Chair/bed transfer assistive device: Walker, Archivist   Ambulation assist   Ambulation activity did not occur: Safety/medical concerns  Assist level: Contact Guard/Touching assist Assistive device: Walker-rolling Max distance: 300'   Walk 10 feet activity   Assist  Walk 10 feet activity did not  occur: Safety/medical concerns  Assist level: Contact Guard/Touching assist Assistive device: Walker-rolling   Walk 50 feet activity   Assist Walk 50 feet with 2 turns activity did not occur: Safety/medical concerns  Assist level: Contact Guard/Touching assist Assistive device: Walker-rolling    Walk 150 feet activity   Assist Walk 150 feet activity did not occur: Safety/medical concerns  Assist level: Contact Guard/Touching assist Assistive device: Walker-rolling    Walk 10 feet on uneven surface  activity   Assist Walk 10 feet on uneven surfaces activity did not occur: Safety/medical concerns   Assist level: Minimal Assistance - Patient > 75% Assistive device: Walker-rolling   Wheelchair     Assist Is the patient using a wheelchair?: No Type of Wheelchair: Manual    Wheelchair assist level: Moderate Assistance - Patient 50 - 74% Max wheelchair distance: 73ft    Wheelchair 50 feet with 2 turns activity    Assist    Wheelchair 50 feet with 2 turns activity did not occur: Safety/medical concerns   Assist Level: Supervision/Verbal cueing   Wheelchair 150 feet activity     Assist  Wheelchair 150 feet activity did not occur: Safety/medical concerns   Assist Level: Moderate Assistance - Patient 50 - 74%   Blood pressure 103/64, pulse 77, temperature 98.4 F (36.9 C), resp. rate 16, height 5' 1 (1.549 m), weight 58 kg, SpO2 99%.  Medical Problem List and Plan: 1. 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 Awaiting approval of disability. Placement pending. Hoping that hospital will provide LOG for placement. Code  status changed to full code after obtaining patient consent Continue CIR therapies including PT, OT, and SLP  -still waiting on SNF offers -discussed plan for disposition conference with patient's mom on Wednesday at 1pm  2.  Impaired mobility: d/c lovenox  since ambulating >150 feet, continue SCDs  3. Knee pain: Improved, decrease gabapentin  to 100mg  HS. Provided list of foods for pain. Tylenol  as needed, d/c voltaren  gel. XR reviewed and is stable, discussed with therapy -Right wrist swelling: XR ordered and shows no acute fracture, shows 2.34mm ulnar variance, brace removed to given patient a break from it. Voltaren  gel ordered prn for discomfort, continue, CT ordered and show no acute osseus injury, continue ace wrap   -Foot pain: tramadol  ordered prn, improved today, continue prn  -Headaches:continue tylenol  and tramadol  prn -Low back pain: continue kpad, asked [place]  to please bring for her -Whole body hypersensitivity: gabapentin  100mg  TID ordered -Pain in left heel: XR obtained and negative, cam boot ordered to help offload heel with therapy, discussed that  this has helped -R shoulder pain: XR neg for acute etiology on 04/06/23, voltaren  gel ordered--encourage ROM as tolerated -05/27/23 denying any pain   4. History of anxiety: discussed with her mom that she feels this was a big risk factor for her accident, propanolol d/ced due to hypotension. Continue magnesium  supplement. Asked SW to place her on neuropsych list, Buspar  ordered prn, discussed placing outpatient referral for behavioral health f/u, discussed that Buspar  has been helpful for her.  5. Neuropsych/cognition: This patient is not capable of making decisions on her own behalf  - 10/12: Still not capable of medical decision making; BUT oriented and communicating, following directions - MUCH improved - PRN melatonin 3mg    for insomnia  - Buspar  prn  6. Skin: -Buttock/sacrum--foam dressing, pressure relief, nutrition -stage 3  left heel, continue foam dressing/ pressure relief  7. Fluids/Electrolytes/Nutrition: Routine in and outs with follow-up chemistries  -Eating  well with cueing   -Dysphagia: continue D3/thins--tolerating this diet -Low protein stores: d/c prosource since patient hates these and is eating 100% of meals -Suboptimal vitamin D  level: increase to 5,000 U daily. continue this dose  -History of magnesium  deficiency: magnesium  500mg  stopped to support BP  - good po intake  8.  Cavitary right lower lobe pneumonia likely aspiration pneumonia/MRSA pneumonia.  Resolved    Latest Ref Rng & Units 05/16/2023    6:28 AM 05/02/2023    5:20 AM 04/18/2023    5:16 AM  CBC  WBC 4.0 - 10.5 K/uL 6.2  5.9  6.0   Hemoglobin 12.0 - 15.0 g/dL 85.0  85.1  85.9   Hematocrit 36.0 - 46.0 % 42.1  43.0  41.6   Platelets 150 - 400 K/uL 300  241  194      9.  History of drug abuse.  Positive cocaine on urine drug screen.  Provide counseling  10.  AKI/hypovolemia and ATN.  Resolved     Latest Ref Rng & Units 05/16/2023    6:28 AM 05/02/2023    5:20 AM 04/18/2023    5:16 AM  BMP  Glucose 70 - 99 mg/dL 87  89  79   BUN 6 - 20 mg/dL 15  14  17    Creatinine 0.44 - 1.00 mg/dL 9.24  9.20  9.32   Sodium 135 - 145 mmol/L 136  138  137   Potassium 3.5 - 5.1 mmol/L 3.9  4.0  3.8   Chloride 98 - 111 mmol/L 105  109  105   CO2 22 - 32 mmol/L 22  23  23    Calcium  8.9 - 10.3 mg/dL 8.9  8.9  9.0     11.  Mild transaminitis with rhabdomyolysis.  Both resolved  12.  Hypotension:  D/c amantadine , d/cflomax, magneisum gluconate decreased to 250mg  HS -1/11-12/25 BPs mostly stable, always a little soft but this appears to be her baseline Vitals:   05/24/23 1429 05/24/23 1952 05/25/23 0602 05/25/23 1343  BP: 96/75 (!) 90/58 99/67 107/69   05/25/23 1932 05/26/23 0347 05/26/23 1351 05/26/23 2000  BP: (!) 89/73 109/72 95/65 108/69   05/27/23 0508 05/27/23 1812 05/27/23 2051 05/28/23 0434  BP: 105/74 105/85 96/63 103/64    13.  HTN: resolved   14. Impaired initiation: resolved, d/c amantadine   15. Hyperglycemia: CBGs mildly elevated, d/ced checks. Prosource d/ced as per patient's request  16. Spasticity:  Continue wrist, elbow brace, and PRAFOs. Ordered a night splint for LLE  D/c baclofen  and tizanidine - much improved  17.  Dyskinetic movements in head and neck - appears to be myoclonic jerks related to hypoxic brain injury - as discussed with pt mom, no specific meds for this on antispasticity meds , consioder benzo although with pt's SUD would avoid this class - EEG reviewed and was negative for seizure.  -  resolved   18. Cervical extensor weakness: decrease tizanidine  to 2mg  HS, decrease baclofen  to 5mg  QHS, improved, d/c'd  19. Bowel and bladder incontinence: continue bowel and bladder program. D/c miralax , decrease tizanidine  to 2mg  HS, d/c'd -05/26/23 LBM yesterday per pt but not documented in 2 days, monitor closely -05/27/23 LBM yesterday  20. Fatigue: resolved, discussed with team may be secondary to anti-spasticity medications, B12 reviewed and is 335, in suboptimal range, will give 1,000U B12 injection 8/7, will order monthly as needed, metanx started, increase to BID, continue, d/c tizanidine , discussed with patient, d/c baclofen    21. MRSA nares positive: negative on repeat, precautions d/ced  22. Tachycardic: resolved, d/c propanolol. Continue tizanidine  2mg  HS. Resolved, amantadine  increased back to 100mg  BID, continue this dose, resolved  23. S/p fall: CT head reviewed and is stable  24. Bilateral foot drop: bilateral AFOs ordered, discussed that these can help her to walk better  25. Vaginal itching: completed fluconazole . Having some discharge now. See below  26. Dysuria -treated twice for UTI's.   12/30- added Pyridium  100 mg TID x 2 days- to help dysuria. 12/31- no change in Dysuria- per nursing, has had since before Xmas- might be tear in mons/vaginal mucosa that's stinging when  voids-   1/1- pt reports Burning gone is good- is done with ABX and pyridium - pt hates pyridium - freaks her out with color change.   1/3 see neurogenic bladder discussion above  27. Poor sitting balance: continue OT/PT, improving  28. Headaches: topamax  72m daily prn ordered, will d/c since not requiring  29. Drowsiness with tramadol : d/ced  30. IUD in place: Pt states she has IUD x58yrs  and believes it's supposed to be removed soon(chart review- Dr. Lavoie placed Mirena  IUD on 07/01/19, good for 94yrs); needs outpatient follow-up with OB/GYN  31. Constipation: d/c baclofen , last BM 12/18, milk of magnesia ordered 12/20, continue magnesium  gluconate-  32. History of drug addiction: tramadol  d/ced as per mom's preference  33. Depression: discussed scheduling for outpatient psychology/psychiatry follow-up, discussed neuropsych f/u while in hospital  -appreciate Dr. Corina follow up  34. Sinusitis: claritin  ordered prn, will d/c since not requiring  35. Pruritus: sarna lotion ordered, discussed that this is prn, will d/c since not requiring     LOS: 191 days A FACE TO FACE EVALUATION WAS PERFORMED  Nicole [name] P Orval Dortch 05/28/2023, 10:03 AM

## 2023-05-29 NOTE — Progress Notes (Signed)
 Patient ID: Nicole [name], female   DOB: Mar 12, 1976, 48 y.o.   MRN: [id number] have called several adult day cares and pt is too young for this option. [facility] will take is 65 yo. Mom aware of this. She feels she is not able to provide care to daughter she is too much care and she is not able to provide this. Will discuss at meeting tomorrow.

## 2023-05-29 NOTE — Progress Notes (Signed)
 Physical Therapy Session Note  Patient Details  Name: Nicole [name] MRN: [id number] Date of Birth: [birth year]  Today's Date: 05/29/2023 PT Individual Time: 1449-1530 PT Individual Time Calculation (min): 41 min   Short Term Goals: Week 1:  PT Short Term Goal 1 (Week 1): Pt will perform supine<>sit with supervision PT Short Term Goal 1 - Progress (Week 1): Other (comment) (new goal) PT Short Term Goal 2 (Week 1): Pt will ambulate at least 157ft with no more than min A, not using an AD. PT Short Term Goal 2 - Progress (Week 1): Other (comment) (new goal) PT Short Term Goal 3 (Week 1): Pt will demonstrate decreased fall risk as noted by an improvement of at least 7 points on the [employer] Test PT Short Term Goal 3 - Progress (Week 1): Other (comment) (new goal) PT Short Term Goal 4 (Week 1): Pt will navigate 4 steps using HRs with no more than CGA  Skilled Therapeutic Interventions/Progress Updates:      Pt sitting in wheelchair with direct handoff of care from SLP. Pt agreeable to therapy treatment. [name] Encouraged safe space for expressing herself and discussing the challenges with returning to an independent - pt understanding but lacks full insight into her deficits. She also reports concern for having the urge to return to drug use if living in an unfavorable housing situation.   Pt completed ambulation ~224ft with CGA and RW to ortho rehab gym - cues for pacing herself and reducing jerking of the RW. Pt instructed in boxing therapeutic activity in standing with minA for dynamic standing balance due to poor balance recovery strategies and impaired stepping strategies. Pt enjoyed this activity and it helped reduce her anger/frustration.   Finished session with dynamic ball toss to rebounder in  unsupported standing - patient needing minA for balance. Practiced picking up the ball from standing position and needing minA to prevent forward LOB. She continues to be mildly impulsive with poor safety awareness, tends to rush herself and has difficulty controlling graded movement.   [name]    Therapy Documentation Precautions:  Precautions Precautions: Fall Precaution Comments: decreased stand balance- reliant on UE, Pain in LLE limits mobility at times Required Braces or Orthoses: Other Brace Other Brace: B adjustable night splints; B elbow cosey splints; L palm protector; R mitt Restrictions Weight Bearing Restrictions Per Provider Order: No General:    Therapy/Group: Individual Therapy  [name] 05/29/2023, 3:25 PM

## 2023-05-29 NOTE — Progress Notes (Signed)
 PROGRESS NOTE   Subjective/Complaints: No new complaints this morning Discussed plan for meeting with her mother at 1 tomorrow Discussed with SW plan for meeting    ROS: as per HPI. Denies CP, SOB, abd pain, N/V/D/C, pain or any other complaints at this time.      Objective:   No results found.   No results for input(s): WBC, HGB, HCT, PLT in the last 72 hours.         No results for input(s): NA, K, CL, CO2, GLUCOSE, BUN, CREATININE, CALCIUM  in the last 72 hours.          Intake/Output Summary (Last 24 hours) at 05/29/2023 1116 Last data filed at 05/28/2023 1837 Gross per 24 hour  Intake 472 ml  Output --  Net 472 ml           Physical Exam: Vital Signs Blood pressure 95/63, pulse 76, temperature 97.8 F (36.6 C), resp. rate 17, height 5' 1 (1.549 m), weight 58 kg, SpO2 98%.  Constitutional: No distress . Vital signs reviewed. Resting in bed HEENT: NCAT, EOMI, oral membranes moist Neck: supple Cardiovascular: RRR without murmur. No JVD    Respiratory/Chest: CTA Bilaterally without wheezes or rales. Normal effort    GI/Abdomen: BS +, non-tender, non-distended Ext: no clubbing, cyanosis, or edema Psych: pleasant and cooperative, cheerful Skin: + Scaling dried skin on bilateral heels left buttock area no examined as she was up and dressed + Left heel stage III DTI has resolved Neuro: memory improving. Remembers staff and myself by name! Neurologic: AAOx2, fair insight and awareness.[name]  Phonation and speech intelligability much improved. Fair insight. MAS 1 right elbow,MAS  tr to 1 in RIght finger and wrist flexors  MAS 1 in extensors of LE's MAS 1 L elbow , MAS 1+ in L wrist and finger flexors. Hypersensitivity to left hand improved, Left sided strength improved, decreased range of motion and pain at end range of motion in left shoulder, stable  1/14  Musculoskeletal:some limitations in right shoulder ROM     Assessment/Plan: 1. Functional deficits which require 3+ hours per day of interdisciplinary therapy in a comprehensive inpatient rehab setting. Physiatrist is providing close team supervision and 24 hour management of active medical problems listed below. Physiatrist and rehab team continue to assess barriers to discharge/monitor patient progress toward functional and medical goals  Care Tool:  Bathing    Body parts bathed by patient: Face, Abdomen, Chest, Left upper leg, Right upper leg, Left arm, Front perineal area, Right arm, Right lower leg, Left lower leg, Buttocks   Body parts bathed by helper: Buttocks Body parts n/a: Right arm, Left arm, Front perineal area, Buttocks, Right upper leg, Left upper leg, Right lower leg, Left lower leg   Bathing assist Assist Level: Supervision/Verbal cueing     Upper Body Dressing/Undressing Upper body dressing   What is the patient wearing?: Bra, Pull over shirt    Upper body assist Assist Level: Minimal Assistance - Patient > 75%    Lower Body Dressing/Undressing Lower body dressing      What is the patient wearing?: Underwear/pull up, Pants     Lower body assist Assist for lower  body dressing: Minimal Assistance - Patient > 75%     Toileting Toileting Toileting Activity did not occur Press Photographer and hygiene only): N/A (no void or bm)  Toileting assist Assist for toileting: Supervision/Verbal cueing     Transfers Chair/bed transfer  Transfers assist  Chair/bed transfer activity did not occur: Safety/medical concerns  Chair/bed transfer assist level: Supervision/Verbal cueing Chair/bed transfer assistive device: Walker, Archivist   Ambulation assist   Ambulation activity did not occur: Safety/medical concerns  Assist level: Contact Guard/Touching assist Assistive device: Walker-rolling Max distance: 300'   Walk 10  feet activity   Assist  Walk 10 feet activity did not occur: Safety/medical concerns  Assist level: Contact Guard/Touching assist Assistive device: Walker-rolling   Walk 50 feet activity   Assist Walk 50 feet with 2 turns activity did not occur: Safety/medical concerns  Assist level: Contact Guard/Touching assist Assistive device: Walker-rolling    Walk 150 feet activity   Assist Walk 150 feet activity did not occur: Safety/medical concerns  Assist level: Contact Guard/Touching assist Assistive device: Walker-rolling    Walk 10 feet on uneven surface  activity   Assist Walk 10 feet on uneven surfaces activity did not occur: Safety/medical concerns   Assist level: Minimal Assistance - Patient > 75% Assistive device: Walker-rolling   Wheelchair     Assist Is the patient using a wheelchair?: No Type of Wheelchair: Manual    Wheelchair assist level: Moderate Assistance - Patient 50 - 74% Max wheelchair distance: 107ft    Wheelchair 50 feet with 2 turns activity    Assist    Wheelchair 50 feet with 2 turns activity did not occur: Safety/medical concerns   Assist Level: Supervision/Verbal cueing   Wheelchair 150 feet activity     Assist  Wheelchair 150 feet activity did not occur: Safety/medical concerns   Assist Level: Moderate Assistance - Patient 50 - 74%   Blood pressure 95/63, pulse 76, temperature 97.8 F (36.6 C), resp. rate 17, height 5' 1 (1.549 m), weight 58 kg, SpO2 98%.  Medical Problem List and Plan: 1. 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 Awaiting approval of disability. Placement pending.  Hoping that hospital will provide LOG for placement. Code status changed to full code after obtaining patient consent Continue CIR therapies including PT, OT, and SLP  -still waiting on SNF offers -discussed plan for disposition conference with patient's mom on Wednesday at 1pm  2.  Impaired mobility: d/c lovenox  since ambulating >150 feet, continue SCDs  3. Knee pain: Improved, decrease gabapentin  to 100mg  HS. Provided list of foods for pain. Tylenol  as needed, d/c voltaren  gel. XR reviewed and is stable, discussed with therapy -Right wrist swelling: XR ordered and shows no acute fracture, shows 2.72mm ulnar variance, brace removed to given patient a break from it. Voltaren  gel ordered prn for discomfort, continue, CT ordered and show no acute osseus injury, continue ace wrap   -Foot pain: tramadol  ordered prn, improved today, continue prn  -Headaches:continue tylenol  and tramadol  prn -Low back pain: continue kpad, asked [place]  to please bring for her -Whole body hypersensitivity: gabapentin  100mg  TID ordered -Pain in left heel: XR obtained and  negative, cam boot ordered to help offload heel with therapy, discussed that this has helped -R shoulder pain: XR neg for acute etiology on 04/06/23, voltaren  gel ordered--encourage ROM as tolerated -05/27/23 denying any pain   4. History of anxiety: discussed with her mom that she feels this was a big risk factor for her accident, propanolol d/ced due to hypotension. Continue magnesium  supplement. Asked SW to place her on neuropsych list, Buspar  ordered prn, discussed placing outpatient referral for behavioral health f/u, discussed that Buspar  has been helpful for her.  5. Neuropsych/cognition: This patient is not capable of making decisions on her own behalf  - 10/12: Still not capable of medical decision making; BUT oriented and communicating, following directions - MUCH improved - PRN melatonin 3mg    for insomnia  - Buspar  prn  6.  Skin: -Buttock/sacrum--foam dressing, pressure relief, nutrition -stage 3 left heel, continue foam dressing/ pressure relief  7. Fluids/Electrolytes/Nutrition: Routine in and outs with follow-up chemistries  -Eating  well with cueing   -Dysphagia: continue D3/thins--tolerating this diet -Low protein stores: d/c prosource since patient hates these and is eating 100% of meals -Suboptimal vitamin D  level: increase to 5,000 U daily. continue this dose  -History of magnesium  deficiency: magnesium  500mg  stopped to support BP  - good po intake  8.  Cavitary right lower lobe pneumonia likely aspiration pneumonia/MRSA pneumonia.  Resolved    Latest Ref Rng & Units 05/16/2023    6:28 AM 05/02/2023    5:20 AM 04/18/2023    5:16 AM  CBC  WBC 4.0 - 10.5 K/uL 6.2  5.9  6.0   Hemoglobin 12.0 - 15.0 g/dL 85.0  85.1  85.9   Hematocrit 36.0 - 46.0 % 42.1  43.0  41.6   Platelets 150 - 400 K/uL 300  241  194      9.  History of drug abuse.  Positive cocaine on urine drug screen.  Provide counseling  10.  AKI/hypovolemia and ATN.  Resolved     Latest Ref Rng & Units 05/16/2023    6:28 AM 05/02/2023    5:20 AM 04/18/2023    5:16 AM  BMP  Glucose 70 - 99 mg/dL 87  89  79   BUN 6 - 20 mg/dL 15  14  17    Creatinine 0.44 - 1.00 mg/dL 9.24  9.20  9.32   Sodium 135 - 145 mmol/L 136  138  137   Potassium 3.5 - 5.1 mmol/L 3.9  4.0  3.8   Chloride 98 - 111 mmol/L 105  109  105   CO2 22 - 32 mmol/L 22  23  23    Calcium  8.9 - 10.3 mg/dL 8.9  8.9  9.0     11.  Mild transaminitis with rhabdomyolysis.  Both resolved  12.  Hypotension:  Resolved  13. HTN: resolved   14. Impaired initiation: resolved, d/c amantadine   15. Hyperglycemia: CBGs mildly elevated, d/ced checks. Prosource d/ced as per patient's request  16. Spasticity:  Continue wrist, elbow brace, and PRAFOs. Ordered a night splint for LLE  D/c baclofen  and tizanidine - much improved  17.  Dyskinetic movements in head and neck -  -   resolved   18. Cervical extensor weakness: improved, continue strengthening exercises  19. Bowel and bladder incontinence: continue bowel and bladder program.  20. Fatigue: resolved, d/c b complex   21. MRSA nares positive: negative on repeat, precautions d/ced  22. Tachycardic: resolved  23. S/p fall: CT head reviewed and is stable  24. Bilateral foot drop: bilateral AFOs ordered, discussed that these can help her to walk better, continue  25. Vaginal itching: resolved  26. Dysuria resolved  27. Poor sitting balance: continue OT/PT, improving  28. Headaches: topamax  63m daily prn ordered, will d/c since not requiring  29. Drowsiness with tramadol : d/ced  30. IUD in place: Pt states she has IUD x47yrs  and believes it's supposed to be removed soon(chart review- Dr. Lavoie placed Mirena  IUD on 07/01/19, good for 61yrs); needs outpatient follow-up with OB/GYN  31. Constipation: d/c baclofen , last BM 12/18, milk of magnesia ordered 12/20, continue magnesium  gluconate  32. History of drug addiction: tramadol  d/ced as per mom's preference  33. Depression: discussed scheduling for outpatient psychology/psychiatry follow-up, discussed neuropsych f/u while in hospital  -appreciate Dr. Corina follow up  34. Sinusitis: claritin  ordered prn, will d/c since not requiring  35. Pruritus: sarna lotion ordered, discussed that this is prn, will d/c since not requiring     LOS: 192 days A FACE TO FACE EVALUATION WAS PERFORMED  Aubrianne Molyneux P Rashied Corallo 05/29/2023, 11:16 AM

## 2023-05-29 NOTE — Progress Notes (Signed)
 Speech Language Pathology Daily Session Note  Patient Details  Name: Nicole [name] MRN: [id number] Date of Birth: 1975-09-30  Today's Date: 05/29/2023 SLP Individual Time: 0202-0248 SLP Individual Time Calculation (min): 46 min  Short Term Goals: Week 9: SLP Short Term Goal 1 (Week 9): Week 27 - Pt will solve mildly complex environmental problems with 70% accuracy given min assist. SLP Short Term Goal 2 (Week 9): Week 27 - Pt will recall basic daily information using external aids with 90% accuracy given supervision assist. SLP Short Term Goal 3 (Week 9): Week 27 - Pt will utilize recommended handwriting strategies to achieve 90% legebility given supervision verbal cues in 4/5 opportunities at the phrase and sentence levels. SLP Short Term Goal 4 (Week 9): Week 27 - Pt will utilize increased vocal intensity w/ supervision verbal cues to remain 90% intelligibile at conversation level in noisy environments in 4/5 opportunities.  Skilled Therapeutic Interventions:  Patient was seen in PM to address cognitive re- training. Pt was alert and seated upright in WC upon SLP arrival. SLP transported pt to atrium for completion of session. Utilizing SLP laptop, pt was challenged to identify and subsequently write potential barriers upon discharge. Pt indep identifying concern for relapse into old behaviors. With min to mod A she identified physical deficits such as taking the trash out, getting up if a fall occurs and cognitive deficits of managing meds and recalling appointments. Pt with spelling and grammatical errors observed with inconsistent awareness of mistakes. In other minutes of session, SLP challenged pt in navigating an apartment website in order to identify relevant information; contact person, prerequisites,etc. Pt requiring mod to max A for task. Pr returned to her room at conclusion of session with direct hand off to PT. SLP to continue POC.   Pain Pain Assessment Pain Scale: 0-10 Pain Score:  0-No pain  Therapy/Group: Individual Therapy  [name] 05/29/2023, 4:10 PM

## 2023-05-29 NOTE — Progress Notes (Signed)
 Occupational Therapy Session Note  Patient Details  Name: Nicole [name] MRN: [id number] Date of Birth: Aug 05, 1975  Today's Date: 05/29/2023 OT Individual Time: 1100-1159 OT Individual Time Calculation (min): 59 min    Short Term Goals: Week 25:  OT Short Term Goal 1: Patient will complete laundry with min assist OT Short Term Goal 2: Patient will prepare simple snack with min assist OT Short Term Goal 3: Patient will don underwear and  pants in less than 2 min OT Short Term Goal 4: Patient will self organize steps of basic self care with 25% less cueing  Skilled Therapeutic Interventions/Progress Updates:   Pt seen for skilled OT session this am. Pt requested toileting initially and needed min cues and close S for RW safety and sequencing hand placement, thoroughness and need for RW for stability surrounding hips during LE dressing and hygiene. Min cues to recall to wash hands at sink and shut water  off. OT established a scavenger hunt tasks with pt generating 5 items that may be found in the gift shop. Pt able to dictate these on a written list. OT used list as memory task during session and pt able to amb with RW down to 1st floor and back with 1 rest seated in lobby. OT made repetitive cues during amb to pace the amb, RW smoothness and placement throughout session. Pt varies pace erratically and demonstrates difficulty with motor planning and tends to rush through the amb due to lack of awareness and carryover. Pt able to locate with CGA all 5 items without the written list due to high interest task and item choices. For light item transport, pt tied purchases in bag on RW and OT utilized as a reminder for pacing and increase smoothness to  keep bag still which pt was able to carryover still with cues. Once back up on unit, pt engaged in seated table top word game on magnetic tilt board with mod cues to use L UE. Limited by end range for sh flex to 110 degrees to place tiles. Able to form 5  simple 3 letter words with min cues for scanning. In room, pt requested to don robe and needed min cues to recall to don L UE 1st. Left pt w/c level with needs, nurse call button and chair alarm engaged.    Pain: denies any pain even with L UE reach for word game on magnetic tilt board  Therapy Documentation Precautions:  Precautions Precautions: Fall Precaution Comments: decreased stand balance- reliant on UE, Pain in LLE limits mobility at times Required Braces or Orthoses: Other Brace Other Brace: B adjustable night splints; B elbow cosey splints; L palm protector; R mitt Restrictions Weight Bearing Restrictions Per Provider Order: No  Therapy/Group: Individual Therapy  [name] 05/29/2023, 7:58 AM

## 2023-05-29 NOTE — Progress Notes (Signed)
 Physical Therapy Session Note  Patient Details  Name: Nicole [name] MRN: [id number] Date of Birth: [birth year]  Today's Date: 05/29/2023 PT Individual Time: 0800-0900  + 1000-1043 PT Individual Time Calculation (min): 60 min  + 43 min  Short Term Goals: Week 1:  PT Short Term Goal 1 (Week 1): Pt will perform supine<>sit with supervision PT Short Term Goal 1 - Progress (Week 1): Other (comment) (new goal) PT Short Term Goal 2 (Week 1): Pt will ambulate at least 176ft with no more than min A, not using an AD. PT Short Term Goal 2 - Progress (Week 1): Other (comment) (new goal) PT Short Term Goal 3 (Week 1): Pt will demonstrate decreased fall risk as noted by an improvement of at least 7 points on the [employer] Test PT Short Term Goal 3 - Progress (Week 1): Other (comment) (new goal) PT Short Term Goal 4 (Week 1): Pt will navigate 4 steps using HRs with no more than CGA  Skilled Therapeutic Interventions/Progress Updates:      1st session: Pt lying in bed - has no c/o pain and agreeable to therapy. Supine<>sitting EOB with supervision. Sit<>stand with CGA and no AD from EOB - pt ambulating to dresser in her room with CGA/minA and no AD - selecting clothes to wear for the day. Patient needing min cues for thoughout and task organization. Once sitting on the bed, she realized she grabbed x2 t-shirts rather than shirts/pants.   At the sink, she completed self care oral hygiene with supervision - she had some motor planning deficits and struggled with sequencing tasks.   Ambulated to main rehab gym with SBA and RW with cues for general safety awareness and pacing herself. Pt c/o L thigh tightness and she requests assistance in stretching. In prone position on mat table, completed PROM for L quad stretching. While standing in // bars, continued hip flexor and quad stretching with PROM knee flexion and hip extension. Also added some calf stretching while standing on incline surface.   Completed  block practice floor transfers for continued repetitions. MinA for safely lowering to the ground and supervision for safety (no cues!!) for completing floor transfers:  Floor transfers from supine to sitting: 1) 30 seconds 2) 30 seconds (!!!!) 3) 25 seconds (!!!) *mild impulsivity and decreased safety awareness while transitioning from 1/2 kneeling to sitting EOM with minA needed for safely transferring to sit to avoid fall.  Returned to her room and patient electing to return to bed to rest. Pt with apraxia and difficulty scooting herself to [facility] in supine position. Needed maxA to boost to [facility]. Left with alarm on and all needs met at end of treatment.  2nd session: Pt sitting in wheelchair and ready for therapy. Has no c/o pain.  Sit<>stand to RW with CGA and ambulates at CGA level and RW to day room rehab gym, ~111ft. Cues for pacing and reducing Jerkiness' of RW management.   Pt participated in therapeutic activity using Wii Sports in standing and sitting to challenge dual-cog tasks, bi-manual skills, and cognitive processing. Patient requiring mod cues to start and hand-over-hand assist for controller manipulation with her LUE. Games also targeted GMC and AROM for BUE (archery, basketball, etc). Pt easily distracted by gym environment and performance also is impacting by her apraxia.  Returned to her room and patient left sitting in wheelchair with her needs met, safety measures in place.    Therapy Documentation Precautions:  Precautions Precautions: Fall Precaution Comments: decreased  stand balance- reliant on UE, Pain in LLE limits mobility at times Required Braces or Orthoses: Other Brace Other Brace: B adjustable night splints; B elbow cosey splints; L palm protector; R mitt Restrictions Weight Bearing Restrictions Per Provider Order: No General:      Therapy/Group: Individual Therapy  [name] 05/29/2023, 7:47 AM

## 2023-05-29 NOTE — Plan of Care (Signed)
  Problem: Consults Goal: RH STROKE PATIENT EDUCATION Description: See Patient Education module for education specifics  Outcome: Progressing   Problem: RH BOWEL ELIMINATION Goal: RH STG MANAGE BOWEL WITH ASSISTANCE Description: STG Manage Bowel with mod I Assistance. Outcome: Progressing Goal: RH STG MANAGE BOWEL W/MEDICATION W/ASSISTANCE Description: STG Manage Bowel with Medication with mod I Assistance. Outcome: Progressing   Problem: RH BLADDER ELIMINATION Goal: RH STG MANAGE BLADDER WITH ASSISTANCE Description: STG Manage Bladder With toileting Assistance Outcome: Progressing   Problem: RH SKIN INTEGRITY Goal: RH STG MAINTAIN SKIN INTEGRITY WITH ASSISTANCE Description: STG Maintain Skin Integrity With min  Assistance. Outcome: Progressing

## 2023-05-30 LAB — CBC
HCT: 41.5 % (ref 36.0–46.0)
Hemoglobin: 14.2 g/dL (ref 12.0–15.0)
MCH: 32.3 pg (ref 26.0–34.0)
MCHC: 34.2 g/dL (ref 30.0–36.0)
MCV: 94.3 fL (ref 80.0–100.0)
Platelets: 223 10*3/uL (ref 150–400)
RBC: 4.4 MIL/uL (ref 3.87–5.11)
RDW: 12 % (ref 11.5–15.5)
WBC: 5.3 10*3/uL (ref 4.0–10.5)
nRBC: 0 % (ref 0.0–0.2)

## 2023-05-30 LAB — BASIC METABOLIC PANEL
Anion gap: 5 (ref 5–15)
BUN: 10 mg/dL (ref 6–20)
CO2: 25 mmol/L (ref 22–32)
Calcium: 8.6 mg/dL — ABNORMAL LOW (ref 8.9–10.3)
Chloride: 107 mmol/L (ref 98–111)
Creatinine, Ser: 0.68 mg/dL (ref 0.44–1.00)
GFR, Estimated: >60 mL/min (ref >60)
Glucose, Bld: 87 mg/dL (ref 70–99)
Potassium: 3.9 mmol/L (ref 3.5–5.1)
Sodium: 137 mmol/L (ref 135–145)

## 2023-05-30 MED ORDER — TAMSULOSIN HCL 0.4 MG PO CAPS
0.4000 mg | ORAL_CAPSULE | Freq: Every day | ORAL | Status: DC
  Administered 2023-05-30: 0.4 mg via ORAL
  Filled 2023-05-30: qty 1

## 2023-05-30 MED ORDER — VITAMIN D 25 MCG (1000 UNIT) PO TABS
4000.0000 [IU] | ORAL_TABLET | Freq: Every day | ORAL | Status: DC
Start: 2023-05-31 — End: 2023-07-19
  Administered 2023-05-31 – 2023-07-19 (×49): 4000 [IU] via ORAL
  Filled 2023-05-30 (×49): qty 4

## 2023-05-30 NOTE — Progress Notes (Signed)
 Physical Therapy Session Note  Patient Details  Name: Nicole [name] MRN: [id number] Date of Birth: 1975-07-31  Today's Date: 05/30/2023 PT Individual Time: 4098-1191 PT Individual Time Calculation (min): 56 min   Short Term Goals: Week 1:  PT Short Term Goal 1 (Week 1): Pt will perform supine<>sit with supervision PT Short Term Goal 1 - Progress (Week 1): Other (comment) (new goal) PT Short Term Goal 2 (Week 1): Pt will ambulate at least 147ft with no more than min A, not using an AD. PT Short Term Goal 2 - Progress (Week 1): Other (comment) (new goal) PT Short Term Goal 3 (Week 1): Pt will demonstrate decreased fall risk as noted by an improvement of at least 7 points on the [employer] Test PT Short Term Goal 3 - Progress (Week 1): Other (comment) (new goal) PT Short Term Goal 4 (Week 1): Pt will navigate 4 steps using HRs with no more than CGA  Skilled Therapeutic Interventions/Progress Updates:      Direct handoff of care from OT with patient standing sinkside finishing up with oral hygiene. Pt has no c/o pain.  Pt requesting to locate her glasses before leaving her room. Patient completed in-room ambulation with CGA/minA and RW - patient with decreased safety awareness, poor spatial insight, frequently running into objects or getting herself "stuck" with her RW. Patient very quick to move like "a bull in a [place] shop." Pt needing mod cues to locate glasses that were on the wire rack above toilet. MinA for balance while reaching overhead to grab them.  Ambulated with supervision and RW from her room to day room rehab gym, ~178ft.   On mat table, worked on [employer] for core strengthening, segmental movement patterns, etc.  -Table Top overhead reach/leg extrension to crunch -Table Top abdominal twists while holding large beach ball -Supine thoracic extension on physioball -Kneeling thoracic extension with physioball  Returned to her room and patient left sitting in wheelchair. All needs  met, chair pad alarm on.  Therapy Documentation Precautions:  Precautions Precautions: Fall Precaution Comments: decreased stand balance- reliant on UE, Pain in LLE limits mobility at times Required Braces or Orthoses: Other Brace Other Brace: B adjustable night splints; B elbow "cosey" splints; L palm protector; R mitt Restrictions Weight Bearing Restrictions Per Provider Order: No General:    Therapy/Group: Individual Therapy  [name] 05/30/2023, 7:46 AM

## 2023-05-30 NOTE — Progress Notes (Signed)
 Occupational Therapy Session Note  Patient Details  Name: Nicole [name] MRN: [id number] Date of Birth: 1975-10-07  Today's Date: 05/30/2023 OT Individual Time: 1400-1455 OT Individual Time Calculation (min): 55 min    Short Term Goals: Week 6:  OT Short Term Goal 1 (Week 6): Patient will complete laundry with min assist OT Short Term Goal 1 - Progress (Week 6): Other (comment) OT Short Term Goal 2 (Week 6): Patient will prepare simple snack with min assist OT Short Term Goal 2 - Progress (Week 6): Other (comment) OT Short Term Goal 3 (Week 6): Patient will don underwear and  pants in less than 2 min OT Short Term Goal 3 - Progress (Week 6): Other (comment) OT Short Term Goal 4 (Week 6): Patient will self organize steps of basic self care with 25% less cueing OT Short Term Goal 4 - Progress (Week 6): Other (comment) OT Short Term Goal 5 (Week 6): xxx OT Short Term Goal 5 - Progress (Week 6): Other (comment)  Skilled Therapeutic Interventions/Progress Updates:    Patient received seated in wheelchair anticipating her therapy session.  Patient states - "I have news!  I am going home Saturday.  Mom is coming to train with you guys."  Walked with supervision to tub room to practice tub transfers with and without tub bench.  Walked to gym to address shoulder range of motion and manual realignment - soft tissue stretch, then worked on functional mobility - transitioning in and out of four point - both from prone and sidelying.  Patient still struggles with automatic motor planning for such transitions.  Walked back to room, and asked patient if needing to use the bathroom.  Initially she said no - but then said- "I should try."  Patient had continent void of bowel and bladder.  Patient walked to sink to wash hands.  Worked on putting hair up in ponytail.  Patient able to use two hands to gather hair with modeling of movement.  Patient able to start placing tie on ponytail - unable to successfully  complete.  Left up in wheelchair with chair pad alarm in place and engaged and call bell/ personal items in reach.    Therapy Documentation Precautions:  Precautions Precautions: Fall Precaution Comments: decreased stand balance- reliant on UE, Pain in LLE limits mobility at times Required Braces or Orthoses: Other Brace Other Brace: B adjustable night splints; B elbow "cosey" splints; L palm protector; R mitt Restrictions Weight Bearing Restrictions Per Provider Order: No  Pain: Pain Assessment Pain Scale: 0-10 Pain Score: 0-No pain     Therapy/Group: Individual Therapy  Nicole [name] 05/30/2023, 3:01 PM

## 2023-05-30 NOTE — Progress Notes (Signed)
 PROGRESS NOTE   Subjective/Complaints: No new complaints this morning Discussed plan to d/c home with mom next Friday Family conference held with mom    ROS: as per HPI. Denies CP, SOB, abd pain, N/V/D/C, pain or any other complaints at this time.      Objective:   No results found.   Recent Labs    05/30/23 0525  WBC 5.3  HGB 14.2  HCT 41.5  PLT 223           Recent Labs    05/30/23 0525  NA 137  K 3.9  CL 107  CO2 25  GLUCOSE 87  BUN 10  CREATININE 0.68  CALCIUM  8.6*            Intake/Output Summary (Last 24 hours) at 05/30/2023 1811 Last data filed at 05/30/2023 1200 Gross per 24 hour  Intake 354 ml  Output --  Net 354 ml           Physical Exam: Vital Signs Blood pressure 105/75, pulse 87, temperature 98.3 F (36.8 C), temperature source Oral, resp. rate 18, height 5\' 1"  (1.549 m), weight 58 kg, SpO2 100%.  Constitutional: No distress . Vital signs reviewed. Resting in bed HEENT: NCAT, EOMI, oral membranes moist Neck: supple Cardiovascular: RRR without murmur. No JVD    Respiratory/Chest: CTA Bilaterally without wheezes or rales. Normal effort    GI/Abdomen: BS +, non-tender, non-distended Ext: no clubbing, cyanosis, or edema Psych: pleasant and cooperative, cheerful Skin: + Scaling dried skin on bilateral heels left buttock area no examined as she was up and dressed + Left heel stage III DTI has resolved Neuro: memory improving. Remembers staff and myself by name! Neurologic: AAOx2, fair insight and awareness.Aaron Aas  Phonation and speech intelligability much improved. Fair insight. MAS 1 right elbow,MAS  tr to 1 in RIght finger and wrist flexors  MAS 1 in extensors of LE's MAS 1 L elbow , MAS 1+ in L wrist and finger flexors. Hypersensitivity to left hand improved, Left sided strength improved, decreased range of motion and pain at end range of motion in left shoulder,  stable 1/15  Musculoskeletal:some limitations in right shoulder ROM     Assessment/Plan: 1. Functional deficits which require 3+ hours per day of interdisciplinary therapy in a comprehensive inpatient rehab setting. Physiatrist is providing close team supervision and 24 hour management of active medical problems listed below. Physiatrist and rehab team continue to assess barriers to discharge/monitor patient progress toward functional and medical goals  Care Tool:  Bathing    Body parts bathed by patient: Face, Abdomen, Chest, Left upper leg, Right upper leg, Left arm, Front perineal area, Right arm, Right lower leg, Left lower leg, Buttocks   Body parts bathed by helper: Buttocks Body parts n/a: Right arm, Left arm, Front perineal area, Buttocks, Right upper leg, Left upper leg, Right lower leg, Left lower leg   Bathing assist Assist Level: Supervision/Verbal cueing     Upper Body Dressing/Undressing Upper body dressing   What is the patient wearing?: Bra, Pull over shirt    Upper body assist Assist Level: Minimal Assistance - Patient > 75%    Lower Body Dressing/Undressing Lower  body dressing      What is the patient wearing?: Underwear/pull up, Pants     Lower body assist Assist for lower body dressing: Minimal Assistance - Patient > 75%     Toileting Toileting Toileting Activity did not occur (Clothing management and hygiene only): N/A (no void or bm)  Toileting assist Assist for toileting: Supervision/Verbal cueing     Transfers Chair/bed transfer  Transfers assist  Chair/bed transfer activity did not occur: Safety/medical concerns  Chair/bed transfer assist level: Supervision/Verbal cueing Chair/bed transfer assistive device: Walker, Archivist   Ambulation assist   Ambulation activity did not occur: Safety/medical concerns  Assist level: Contact Guard/Touching assist Assistive device: Walker-rolling Max distance: 300'    Walk 10 feet activity   Assist  Walk 10 feet activity did not occur: Safety/medical concerns  Assist level: Contact Guard/Touching assist Assistive device: Walker-rolling   Walk 50 feet activity   Assist Walk 50 feet with 2 turns activity did not occur: Safety/medical concerns  Assist level: Contact Guard/Touching assist Assistive device: Walker-rolling    Walk 150 feet activity   Assist Walk 150 feet activity did not occur: Safety/medical concerns  Assist level: Contact Guard/Touching assist Assistive device: Walker-rolling    Walk 10 feet on uneven surface  activity   Assist Walk 10 feet on uneven surfaces activity did not occur: Safety/medical concerns   Assist level: Minimal Assistance - Patient > 75% Assistive device: Walker-rolling   Wheelchair     Assist Is the patient using a wheelchair?: No Type of Wheelchair: Manual    Wheelchair assist level: Moderate Assistance - Patient 50 - 74% Max wheelchair distance: 36ft    Wheelchair 50 feet with 2 turns activity    Assist    Wheelchair 50 feet with 2 turns activity did not occur: Safety/medical concerns   Assist Level: Supervision/Verbal cueing   Wheelchair 150 feet activity     Assist  Wheelchair 150 feet activity did not occur: Safety/medical concerns   Assist Level: Moderate Assistance - Patient 50 - 74%   Blood pressure 105/75, pulse 87, temperature 98.3 F (36.8 C), temperature source Oral, resp. rate 18, height 5\' 1"  (1.549 m), weight 58 kg, SpO2 100%.  Medical Problem List and Plan: 1. 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 Awaiting  approval of disability. Placement pending. Hoping that hospital will provide LOG for placement. Code status changed to full code after obtaining patient consent Continue CIR therapies including PT, OT, and SLP  Family and team conferences held 1/15, discussed her progress, discussed that mom plans to pursue abandonment as she feels unable to care for Ms. Tess at home, discussed that Ms Goodrick is currently MinA and we can do caregiver training with mother next week to show her what assistance she needs  2.  Impaired mobility: d/c lovenox  since ambulating >150 feet, continue SCDs  3. Knee pain: Improved, decrease gabapentin  to 100mg  HS. Provided list of foods for pain. Tylenol  as needed, d/c voltaren  gel. XR reviewed and is stable, discussed with therapy -Right wrist swelling: XR ordered and shows no acute fracture, shows 2.1mm ulnar variance, brace removed to given patient a break from  it. Voltaren  gel ordered prn for discomfort, continue, CT ordered and show no acute osseus injury, continue ace wrap   -Foot pain: tramadol  ordered prn, improved today, continue prn  -Headaches:continue tylenol  and tramadol  prn -Low back pain: continue kpad, asked [place]  to please bring for her -Whole body hypersensitivity: gabapentin  100mg  TID ordered -Pain in left heel: XR obtained and negative, cam boot ordered to help offload heel with therapy, discussed that this has helped -R shoulder pain: XR neg for acute etiology on 04/06/23, voltaren  gel ordered--encourage ROM as tolerated -05/27/23 denying any pain   4. History of anxiety: discussed with her mom that she feels this was a big risk factor for her accident, propanolol d/ced due to hypotension. Continue magnesium  supplement. Asked SW to place her on neuropsych list, Buspar  ordered prn, discussed placing outpatient referral for behavioral health f/u, discussed that Buspar  has been helpful for her.  5. Neuropsych/cognition: This patient is not capable of making  decisions on her own behalf  - 10/12: Still not capable of medical decision making; BUT oriented and communicating, following directions - MUCH improved - PRN melatonin 3mg    for insomnia  - Buspar  prn  6. Skin: -Buttock/sacrum--foam dressing, pressure relief, nutrition -stage 3 left heel, continue foam dressing/ pressure relief  7. Fluids/Electrolytes/Nutrition: Routine in and outs with follow-up chemistries  -Eating  well with cueing   -Dysphagia: continue D3/thins--tolerating this diet -Low protein stores: d/c prosource since patient hates these and is eating 100% of meals -Suboptimal vitamin D  level: increase to 5,000 U daily. continue this dose  -History of magnesium  deficiency: magnesium  500mg  stopped to support BP  - good po intake  8.  Cavitary right lower lobe pneumonia likely aspiration pneumonia/MRSA pneumonia.  Resolved    Latest Ref Rng & Units 05/30/2023    5:25 AM 05/16/2023    6:28 AM 05/02/2023    5:20 AM  CBC  WBC 4.0 - 10.5 K/uL 5.3  6.2  5.9   Hemoglobin 12.0 - 15.0 g/dL 10.9  60.4  54.0   Hematocrit 36.0 - 46.0 % 41.5  42.1  43.0   Platelets 150 - 400 K/uL 223  300  241      9.  History of drug abuse.  Positive cocaine on urine drug screen.  Provide counseling  10.  AKI/hypovolemia and ATN.  Resolved     Latest Ref Rng & Units 05/30/2023    5:25 AM 05/16/2023    6:28 AM 05/02/2023    5:20 AM  BMP  Glucose 70 - 99 mg/dL 87  87  89   BUN 6 - 20 mg/dL 10  15  14    Creatinine 0.44 - 1.00 mg/dL 9.81  1.91  4.78   Sodium 135 - 145 mmol/L 137  136  138   Potassium 3.5 - 5.1 mmol/L 3.9  3.9  4.0   Chloride 98 - 111 mmol/L 107  105  109   CO2 22 - 32 mmol/L 25  22  23    Calcium  8.9 - 10.3 mg/dL 8.6  8.9  8.9     11.  Mild transaminitis with rhabdomyolysis.  Both resolved  12.  Hypotension:  Resolved  13. HTN: resolved   14. Impaired initiation: resolved, d/c amantadine   15. Hyperglycemia: CBGs mildly elevated, d/ced checks. Prosource d/ced as per  patient's request  16. Spasticity:  Continue wrist, elbow brace, and PRAFOs. Ordered a night splint for LLE  D/c baclofen  and tizanidine - much improved  17.  Dyskinetic movements  in head and neck -  -  resolved   18. Cervical extensor weakness: improved, continue strengthening exercises  19. Bowel and bladder incontinence: continue bowel and bladder program. Flomax  started for urgency  20. Fatigue: resolved, d/c b complex   21. MRSA nares positive: negative on repeat, precautions d/ced  22. Tachycardic: resolved  23. S/p fall: CT head reviewed and is stable  24. Bilateral foot drop: bilateral AFOs ordered, discussed that these can help her to walk better, continue  25. Vaginal itching: resolved  26. Dysuria resolved  27. Poor sitting balance: continue OT/PT, improving  28. Headaches: topamax  34m daily prn ordered, will d/c since not requiring  29. Drowsiness with tramadol : d/ced  30. IUD in place: Pt states she has IUD x95yrs  and believes it's supposed to be removed soon(chart review- Dr. Lavoie placed Mirena  IUD on 07/01/19, good for 70yrs); needs outpatient follow-up with OB/GYN  31. Constipation: d/c baclofen , last BM 12/18, milk of magnesia ordered 12/20, continue magnesium  gluconate  32. History of drug addiction: tramadol  d/ced as per mom's preference  33. Depression: discussed scheduling for outpatient psychology/psychiatry follow-up, discussed neuropsych f/u while in hospital  -appreciate Dr. Cheryll Corti follow up  34. Sinusitis: claritin  ordered prn, will d/c since not requiring  35. Pruritus: sarna lotion ordered, discussed that this is prn, will d/c since not requiring    >50 minutes spent in family conference, follow-up phone call with mom, team conference, and with patient today, discussed that plan is for patient to discharge next week LOS: 193 days A FACE TO FACE EVALUATION WAS PERFORMED  Keven Pel Frederika Hukill 05/30/2023, 6:11 PM

## 2023-05-30 NOTE — Progress Notes (Addendum)
 Patient ID: Nicole [name], female   DOB: July 07, 1975, 48 y.o.   MRN: [id number]  Have submitted PCS referral request to [facility]. Meeting with MD and Director of Unit today with mom to discuss discharge plan.  2;44 PM Meeting with Beth-Director and Dr. Concetta Dee, pt's mom and myself to discuss mom taking pt home and providing care 24/7 care. Mom feels this is too much for her but is willing to come in for education on Tuesday and Wednesday for therapies. Scheduled for 10-12 ans 1-2 pm. Mom is considering abandoning pt and allowing APS to take over and rescind her guardianship and have APS or pt become a ward of the state. Mom has number to call APS and person to talk to [facility]. This worker will also call and see what the process is. Mom continues to feel she can not provide the care nor has the space for pt at home. Discussed private duty and PCS services. Aware of cost of private duty $25.00 per hour, PCS referral has been made. Will continue to work on discharge needs.

## 2023-05-30 NOTE — Discharge Summary (Addendum)
 Physician Discharge Summary  Patient ID: Nicole [name] MRN: [id number] DOB/AGE: [birth year] 47 y.o.  Admit date: 11/18/2022 Discharge date: 07/19/2023  Discharge Diagnoses:  Principal Problem:   Hypoxic brain injury [facility]) Active Problems:   Protein-calorie malnutrition, severe   Essential hypertension   Pressure injury of skin   History of cocaine abuse (HCC)   Anxiety with depression AKI Pressure injury to buttocks/sacrum as well as stage III left heel  cavitary pneumonia Mild transaminitis/rhabdomyolysis Bladder/stress incontinence  Discharged Condition: Stable  Significant Diagnostic Studies: No results found.   Labs:  Basic Metabolic Panel: No results for input(s): "NA", "K", "CL", "CO2", "GLUCOSE", "BUN", "CREATININE", "CALCIUM", "MG", "PHOS" in the last 168 hours.   CBC: No results for input(s): "WBC", "NEUTROABS", "HGB", "HCT", "MCV", "PLT" in the last 168 hours.   CBG: No results for input(s): "GLUCAP" in the last 168 hours.  Family history.  Father with leukemia.  Denies any colon cancer esophageal cancer or rectal cancer  Brief HPI:   Nicole [name] is a 48 y.o. right-handed female with history significant for asthma as well as anxiety/depression, drug abuse.  Per chart review patient lives with her mother.  1 level home.  Independent prior to admission.  Presented 10/31/2022 after being found down in a field beside a condemned building.  Patient unresponsive on arrival to the ED, hypoxic on 15 L nonrebreather mask.  Required intubation for airway protection.  Cranial CT scan showed abnormal symmetric edema with some bilateral globus paladi concerning for acute hypoxic/ischemic or metabolic/toxic injury.  No mass effect.  CT cervical spine negative.  MRI of the brain symmetric signal abnormality within the globi most consistent with toxic metabolic injury.  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  Small amount of bifrontal subarachnoid  hemorrhage.  EEG negative for seizure.  CT of the chest abdomen and pelvis showed right greater than left lower lobe consolidation with numerous associated central lobar nodules and groundglass opacities.  Trace right pleural effusion.  No pneumothorax.  Admission chemistries unremarkable except sodium 161 chloride 124 CO2 21 BUN 79 creatinine 2.57 AST 102 ALT 457 total bilirubin 2.2 white blood cell count 43,600 lactic acid 4.4 BNP 140, urine drug screen positive cocaine, blood cultures no growth to date.  Neurosurgery Dr. Kendell Bane Dawley follow-up for diffuse cerebral edema in the setting of suspected acute hypoxic brain injury no neurosurgical intervention.  Infectious disease consulted in regards to cavitary pneumonia initially placed on broad-spectrum antibiotics transition to Zyvox.  Leukocytosis continued to improve.  Lovenox added for DVT prophylaxis.  Echocardiogram showed no valvular vegetation and ejection fraction of 60 to 65%.  TEE not recommended at this time.  She was extubated 6/23.  AKI with multiple electrolyte abnormalities most likely combination of hypovolemia and ATN placed on gentle IV fluids with creatinine normalizing.  Therapy evaluations completed due to patient decreased functional mobility was admitted for a comprehensive rehab program.   Hospital Course: Nicole [name] was admitted to rehab 11/18/2022 for inpatient therapies to consist of PT, ST and OT at least three hours five days a week. Past admission physiatrist, therapy team and rehab RN have worked together to provide customized collaborative inpatient rehab.  Pertaining to patient's acute hypoxic brain injury/bilateral corona radiata watershed infarcts.  Patient with unknown downtime she was extubated 11/05/2022.  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  She continued to make progressive gains.  Initially on Lovenox for DVT prophylaxis discontinued as  mobility  improved.  Mood stabilization with the use Minipress and follow-up with neuropsychology.  It was felt patient not capable of making her own decisions on her behalf.  History of anxiety discussed at length with mother on Inderal discontinued due to hypotension.  Hospital course she did have some knee and wrist pain.  Right wrist has some swelling x-ray showed no acute fractures she was placed on Voltaren gel initially and has since been discontinued.  She is currently using a Lidoderm patch as well as tramadol for breakthrough pain.  Conservative care of right knee with K-pad.  .  Right shoulder pain x-rays negative.     Noted close monitoring of skin buttocks/sacrum foam dressing pressure-relief stage III left heel continue foam dressing and pressure release.  Hospital course cavitary right lower lobe pneumonia likely aspiration/MRSA pneumonia resolved remaining afebrile.  She did have a history of drug abuse positive cocaine urine drug screen on admission provide accounts regarding cessation of illicit drug use.  AKI/hypovolemic and ATN resolved latest creatinine 0. 68.  Mild transaminase with rhabdomyolysis both resolved.  .  Bouts of headaches improved she was initially getting Topamax as needed and since discontinued.  Patient did have an IUD in place OB/GYN consult to [facility] review of records had been placed 07/01/2019 good for 5 years follow-up outpatient.  She did have some bladder incontinence maintained on Myrbetriq with a ambulatory referral made to urology.   Blood pressures were monitored on TID basis and remain controlled     Rehab course: During patient's stay in rehab weekly team conferences were held to monitor patient's progress, set goals and discuss barriers to discharge. At admission, patient required total as  Media Information   Document Information  Photos  Left buttock, blanches.  05/06/2023 00:46  Attached To:  Hospital Encounter on 11/18/22  Source Information  Martina Sinner, RN  [facility]  Document History   sist stand pivot transfers total assist step pivot transfer total assist supine to sit  He/She  has had improvement in activity tolerance, balance, postural control as well as ability to compensate for deficits. He/She has had improvement in functional use RUE/LUE  and RLE/LLE as well as improvement in awareness.  Patient with excellent overall gains during her long hospitalization stay.  Completed ambulation 200 feet contact-guard rolling walker cues for pacing herself and reducing ambulation speed.  Working with dynamic ball toss to rebounder and supported standing for maintenance of balance.  Patient remained eager to attend therapies.  Patient showing improved ability to stand from sitting with weight on both feet so standing with balance versus pulling self over to 1 side.  Required intermittent breaks due to any fatigue that have greatly improved ambulating rolling walker overall contact-guard assist.  Patient able to locate with contact-guard assist 5 items with out the written list due to high interest tasks.  SLP continue to follow and challenged patient and place an order within an environment with competing auditory stimulus.  Patient engaged in conversational exchange with unfamiliar listener for 2-3 exchanges.  Patient able to repeat self in 1 instance with ability to improve vocal intensity.  Given a direct target patient identified information 67% accurate given moderate assist.  Discharge planning ongoing long talks held with mother in regards to discharge plan for skilled nursing facility placement       Disposition: Discharge disposition: 03-Skilled Nursing Facility     SNF   Diet: Regular  Special Instructions: No alcohol  or illicit drug use    Sacral foam dressing to sacrum/left buttocks wound.  Foam dressing to bilateral heels to pad and protect.  Change foam dressing every 3 days or as needed soiling  Medications at  discharge 1.  Tylenol as needed 2.  Magnesium gluconate 250 mg nightly 3.  Vitamin D 4000 units p.o. daily 4.  Xoponex inhaler 1-2 puffs every 6 hours as needed wheeezing 5.MVI daily 6.Lidoderm patch 2 patches administer over 12 hours 7.  Myrbetriq 25 mg p.o. daily 8.  Tramadol 50 mg every 12 hours as needed pain 9.  Minipress 1 mg nightly    30-35 minutes were spent completing discharge summary and discharge planning  Discharge Instructions     Ambulatory referral to Neurology   Complete by: As directed    An appointment is requested in approximately: 4 weeks hypoxic brain injury   Ambulatory referral to Physical Medicine Rehab   Complete by: As directed    Moderate complexity follow-up 1 to 2 weeks hypoxic brain injury        Follow-up Information     Raulkar, Drema Pry, MD Follow up.   Specialty: Physical Medicine and Rehabilitation Why: Office to call for appointment Contact information: [address] [place] [address] [phone]         Dawley, Kendell Bane C, DO Follow up.   Why: Call for appointment Contact information: [address] [place] [address] [place] [address] [phone]         Odette Fraction, MD Follow up.   Specialty: Infectious Diseases Why: Call for appointment Contact information: [address] [place] [address] [phone]         Georganna Skeans, MD Follow up.   Specialty: Family Medicine Contact information: [address] [place] [address] [phone]                 Signed: Charlton Amor 07/19/2023, 7:08 AM

## 2023-05-30 NOTE — Patient Care Conference (Signed)
 Inpatient RehabilitationTeam Conference and Plan of Care Update Date: 05/30/2023   Time: 11:04 AM    Patient Name: Nicole [name]      Medical Record Number: [id number]  Date of Birth: June 29, 1975 Sex: Female         Room/Bed: 4W09C/4W09C-01 Payor Info: Payor: [employer] / Plan: [employer] / Product Type: *No Product type* /    Admit Date/Time:  11/18/2022  3:15 PM  Primary Diagnosis:  Hypoxic brain injury [facility])  Hospital Problems: Principal Problem:   Hypoxic brain injury (HCC) Active Problems:   Protein-calorie malnutrition, severe   Essential hypertension   Pressure injury of skin   History of cocaine abuse (HCC)   Anxiety with depression    Expected Discharge Date: Expected Discharge Date: 06/08/23 (??)  Team Members Present: Physician leading conference: Dr. Laverle Postin Social Worker Present: Adrianna Albee, LCSW Nurse Present: Forrestine Ike, RN;[employer], RN PT Present: Oma Bias, PT OT Present: Celestino Cole, OT SLP Present: Dorla Gartner, SLP PPS Coordinator present : Kathline Paras, PT     Current Status/Progress Goal Weekly Team Focus  Bowel/Bladder   Continent of bowel, mostly incontinent of Bladder. LBM 05/29/23   Regain continence of Bladder   Continue with timed toileting    Swallow/Nutrition/ Hydration   upgraded to regular solids   eval only  N/A    ADL's   contact guard to intermittent min assist   goals supervision - contact guard   functional mobility, problem recognition/ problem solving, self organizing, use of left limbs    Mobility   supervision bed mobility, supervision sit<>Stands with RW, CGA stand pivot transfers wtih RW, SBA ambulation >244ft with RW, minA for stair navigation. Mild impulsivity and decreased safety awarenesss, decreased insight   Goals upgraded to supervision  floor transfers, safety training, stretching and strengthening, dual-cog task    Communication   vocal  intensity in noisy environments with sup A   mod I   carryover of intelligibility across settings and listeners    Safety/Cognition/ Behavioral Observations  mod- max functional problem solving awareness, min verbal problem solving,   sup to min A   functional problem solving    Pain   Denies pain   Remain free of pain   Assess Q4 and prn    Skin   Barrier cream to reddenned butt cheeks, and mepilex foam (prophylaxis)   No new skin breakdown  Assess QS and prn      Discharge Planning:  Meeting today with Mom, MD and Director of Unit to discuss palns and possibility going home. Mom continues to report can not take care of pt   Team Discussion: Hypoxic brain injury. Medical stable with most medications being weaned. Remain incontinent at times but is aware and calls staff. Time toileting not successful. Not appropriate for adult day care due to age. Continues to need supervision to [facility] for ADL tasks. Working on IADLs and continues to have left sided tone. BSC not recommended due to high risk of fall and will not be at independent with transfers safety. Working on Engineer, building services and mobility. ST working on carryover of intelligibility across settings and functional problem solving. Meeting with family today to discuss discharge plan. Patient on target to meet rehab goals: Mother encouraged to make decision today on discharge plan. Recommending discharge date of 06/02/2023  *See Care Plan and progress notes for long and short-term goals.   Revisions to Treatment Plan:  Medication adjustments. Monitor  labs and VS. Recommend a couple of days of family training.  Teaching Needs: Medications, safety, self care, transfers, skin care, toileting, etc.   Current Barriers to Discharge: Incontinence and Lack of/limited family support  Possible Resolutions to Barriers: SNF vs State facility if guardianship released.      Medical Summary Current Status: allergic rhinitis,  pain/insomnia, polypharmacy  Barriers to Discharge: Medical stability  Barriers to Discharge Comments: allergic rhinitis, pain/insomnia, polypharmacy Possible Resolutions to [employer] Focus: d/c claritin , d/c gabapentin , continue melatonin, d/c Gerrhardt's butt cream, d/c hydrocortisone , decrease vitamin D  to 4,000U,d/c prn constipation medications   Continued Need for Acute Rehabilitation Level of Care: The patient requires daily medical management by a physician with specialized training in physical medicine and rehabilitation for the following reasons: Direction of a multidisciplinary physical rehabilitation program to maximize functional independence : Yes Medical management of patient stability for increased activity during participation in an intensive rehabilitation regime.: Yes Analysis of laboratory values and/or radiology reports with any subsequent need for medication adjustment and/or medical intervention. : Yes   I attest that I was present, lead the team conference, and concur with the assessment and plan of the team.   Alexandro Angelica 05/30/2023, 3:09 PM

## 2023-05-30 NOTE — Progress Notes (Signed)
 Speech Language Pathology Daily Session Note  Patient Details  Name: Nicole [name] MRN: [id number] Date of Birth: [birth year]  Today's Date: 05/30/2023 SLP Individual Time: 4782-9562 SLP Individual Time Calculation (min): 43 min  Short Term Goals: Week 9: SLP Short Term Goal 1 (Week 9): Week 27 - Pt will solve mildly complex environmental problems with 70% accuracy given min assist. SLP Short Term Goal 2 (Week 9): Week 27 - Pt will recall basic daily information using external aids with 90% accuracy given supervision assist. SLP Short Term Goal 3 (Week 9): Week 27 - Pt will utilize recommended handwriting strategies to achieve 90% legebility given supervision verbal cues in 4/5 opportunities at the phrase and sentence levels. SLP Short Term Goal 4 (Week 9): Week 27 - Pt will utilize increased vocal intensity w/ supervision verbal cues to remain 90% intelligibile at conversation level in noisy environments in 4/5 opportunities.  Skilled Therapeutic Interventions:  Patient was seen in am to address cognitive re- training and speech intelligibility. Pt was alert and seated upright in WC upon SLP arrival. She reported a food spill causing her pants to be soiled and requested SLP assistance in changing. Pt benefiting from min A for safety due to standing before gait belt was placed and before clinician was in position. She was easily redirected. Pt transferred to atrium for continuation of session. Taken to an on [employer], SLP challenged pt in placing an order within an environment with competing auditory stimulus. Pt engaged in conversational exchange with unfamiliar listener for ~2-3 exchanges. Pt asked to repeat self in one instance with ability to improve vocal intensity. In  other minutes of session, SLP addressed problem solving through challenging pt to review and navigate home health websites. Given a directed target, pt identified information with 67% acc given mod A. Pt transported back to  unit and room where chair alarm was activated and call button was placed within reach. SLP to continue POC.   Pain Pain Assessment Pain Scale: 0-10 Pain Score: 0-No pain  Therapy/Group: Individual Therapy  Adela Holter 05/30/2023, 11:27 AM

## 2023-05-30 NOTE — Progress Notes (Addendum)
 Occupational Therapy Session Note  Patient Details  Name: Nicole [name] MRN: [id number] Date of Birth: 01-25-1976  Today's Date: 05/30/2023 OT Individual Time: 4098-1191 OT Individual Time Calculation (min): 73 min    Short Term Goals: Week 6:  OT Short Term Goal 1 (Week 6): Patient will complete laundry with min assist OT Short Term Goal 1 - Progress (Week 6): Other (comment) OT Short Term Goal 2 (Week 6): Patient will prepare simple snack with min assist OT Short Term Goal 2 - Progress (Week 6): Other (comment) OT Short Term Goal 3 (Week 6): Patient will don underwear and  pants in less than 2 min OT Short Term Goal 3 - Progress (Week 6): Other (comment) OT Short Term Goal 4 (Week 6): Patient will self organize steps of basic self care with 25% less cueing OT Short Term Goal 4 - Progress (Week 6): Other (comment) OT Short Term Goal 5 (Week 6): xxx OT Short Term Goal 5 - Progress (Week 6): Other (comment)  Skilled Therapeutic Interventions/Progress Updates:    Patient received supine in bed - awake and alert, eager to walk to bathroom.  Attempted to void on commode without luck - brief dry.  Patient showing improved ability to stand from sitting with weight on both feet so standing with balance versus pulling self over to one side and loss of balance.  Patient sequencing shower with intermittent cueing to continue task.  Patient still has intermittent breaks in activity and needs a prompt to return to previous task.  Patient does well with time based goals to focus herself during BADL. She was able to don her left shoe and sock today without physical assistance.  Able to place ponytail tie over hair.  Left up in wheelchair for direct hand off to PT.    Therapy Documentation Precautions:  Precautions Precautions: Fall Precaution Comments: decreased stand balance- reliant on UE, Pain in LLE limits mobility at times Required Braces or Orthoses: Other Brace Other Brace: B adjustable night  splints; B elbow "cosey" splints; L palm protector; R mitt Restrictions Weight Bearing Restrictions Per Provider Order: No  Pain: Pain Assessment Pain Scale: 0-10 Pain Score: 0-No painLeft hip and leg 3-4/10         Therapy/Group: Individual Therapy  Nicole [name] 05/30/2023, 11:45 AM

## 2023-05-31 NOTE — Plan of Care (Signed)
Had a quite a night. No new changes to report. Safety maintained. Problem: Consults Goal: RH STROKE PATIENT EDUCATION Description: See Patient Education module for education specifics  Outcome: Progressing   Problem: RH BOWEL ELIMINATION Goal: RH STG MANAGE BOWEL WITH ASSISTANCE Description: STG Manage Bowel with mod I Assistance. Outcome: Progressing Goal: RH STG MANAGE BOWEL W/MEDICATION W/ASSISTANCE Description: STG Manage Bowel with Medication with mod I Assistance. Outcome: Progressing   Problem: RH BLADDER ELIMINATION Goal: RH STG MANAGE BLADDER WITH ASSISTANCE Description: STG Manage Bladder With toileting Assistance Outcome: Progressing   Problem: RH SKIN INTEGRITY Goal: RH STG MAINTAIN SKIN INTEGRITY WITH ASSISTANCE Description: STG Maintain Skin Integrity With min  Assistance. Outcome: Progressing

## 2023-05-31 NOTE — Progress Notes (Addendum)
Patient ID: Nicole [name], female   DOB: [birth year], 48 y.o.   MRN: [id number]  Spoke with Mom who has a call into [employer] to rescind her guardianship and decide if will abandon pt. Have spoken with [employer] and they need medical records have sent the form to send them in. Case number: [id number]. Have faxed additional records to [facility]. Mom feels she is not able to care for daughter and quite upset regarding all of this which is understandable. Spoke with [facility] to let her know [employer] needs additional records and her case number she was not aware of this. Will also reach out to [facility] regarding what the plan is if mom decides to abandon her daughter.   2:03 PM Have called [employer] at [employer] to discuss situation and next step. Awaiting return call

## 2023-05-31 NOTE — Progress Notes (Signed)
Occupational Therapy Weekly Progress Note  Patient Details  Name: Nicole [name] MRN: [id number] Date of Birth: Sep 30, 1975  Beginning of progress report period: May 24, 2023 End of progress report period: May 31, 2023  Today's Date: 05/31/2023 OT Individual Time: 4098-1191 OT Individual Time Calculation (min): 75 min    Patient has met 2 of 4 short term goals.  Patient has had limited opportunity to complete snack prep and laundry skills / IADL this week.  Patient showing improved self awareness in familiar functional tasks, and even at times some problem recognition and problem solving.    Patient continues to demonstrate the following deficits: muscle joint tightness, impaired timing and sequencing, abnormal tone, unbalanced muscle activation, motor apraxia, decreased coordination, and decreased motor planning, decreased attention to left and decreased motor planning, decreased initiation, decreased attention, decreased awareness, decreased problem solving, decreased safety awareness, decreased memory, and delayed processing, and decreased sitting balance, decreased standing balance, decreased postural control, hemiplegia, and decreased balance strategies and therefore will continue to benefit from skilled OT intervention to enhance overall performance with BADL, iADL, and Reduce care partner burden.  Patient progressing toward long term goals..  Continue plan of care.  OT Short Term Goals Week 6:  OT Short Term Goal 1 (Week 6): Patient will complete laundry with min assist OT Short Term Goal 1 - Progress (Week 6): Partly met OT Short Term Goal 2 (Week 6): Patient will prepare simple snack with min assist OT Short Term Goal 2 - Progress (Week 6): Partly met OT Short Term Goal 3 (Week 6): Patient will don underwear and  pants in less than 2 min OT Short Term Goal 3 - Progress (Week 6): Met OT Short Term Goal 4 (Week 6): Patient will self organize steps of basic self care with 25% less  cueing OT Short Term Goal 4 - Progress (Week 6): Met OT Short Term Goal 5 (Week 6): xxx OT Short Term Goal 5 - Progress (Week 6): Other (comment) Week 7:  OT Short Term Goal 1 (Week 7): Patient will complete hair care including putting hair into ponytail with intermittent min asssit OT Short Term Goal 1 - Progress (Week 7): Other (comment) OT Short Term Goal 2 (Week 7): Patient will prepare simple snack preparation with CGA OT Short Term Goal 2 - Progress (Week 7): Other (comment) OT Short Term Goal 3 (Week 7): Patient will complete laundry task with contact guard assist OT Short Term Goal 3 - Progress (Week 7): Other (comment) OT Short Term Goal 4 (Week 7): xxx OT Short Term Goal 4 - Progress (Week 7): Other (comment) OT Short Term Goal 5 (Week 7): xxx OT Short Term Goal 5 - Progress (Week 7): Other (comment)  Skilled Therapeutic Interventions/Progress Updates:    Patient received in bed eager for therapy.  Patient walked to bathroom for continent void.  Able to complete hygiene and clothing management, then walk to shower.  Continued work to improve her ability to self organize steps of task and environment in shower.  Walked to sink to complete grooming - and dressing 75% in standing!   Continued wok to reduce cueing to improve patient's safe autonomy with basic self care skills.  Left in bed at end of session with alarm engaged, and call bell / personal items in reach.   Therapy Documentation Precautions:  Precautions Precautions: Fall Precaution Comments: decreased stand balance- reliant on UE, Pain in LLE limits mobility at times Required Braces or Orthoses: Other Brace Other  Brace: B adjustable night splints; B elbow "cosey" splints; L palm protector; R mitt Restrictions Weight Bearing Restrictions Per Provider Order: No  Pain:  Mild back pain with unsupported sitting      Therapy/Group: Individual Therapy  [name] 05/31/2023, 3:16 PM

## 2023-05-31 NOTE — Progress Notes (Signed)
PROGRESS NOTE   Subjective/Complaints: Discussed plan for discharge next Friday BP soft, flomax d/ced She had a good night No issues overnight  ROS: as per HPI. Denies CP, SOB, abd pain, N/V/D/C, pain or any other complaints at this time.      Objective:   No results found.   Recent Labs    05/30/23 0525  WBC 5.3  HGB 14.2  HCT 41.5  PLT 223           Recent Labs    05/30/23 0525  NA 137  K 3.9  CL 107  CO2 25  GLUCOSE 87  BUN 10  CREATININE 0.68  CALCIUM 8.6*            Intake/Output Summary (Last 24 hours) at 05/31/2023 1059 Last data filed at 05/31/2023 0805 Gross per 24 hour  Intake 716 ml  Output --  Net 716 ml           Physical Exam: Vital Signs Blood pressure 100/62, pulse 82, temperature 98 F (36.7 C), resp. rate 18, height 5\' 1"  (1.549 m), weight 58 kg, SpO2 97%.  Constitutional: No distress . Vital signs reviewed. Resting in bed HEENT: NCAT, EOMI, oral membranes moist Neck: supple Cardiovascular: RRR without murmur. No JVD    Respiratory/Chest: CTA Bilaterally without wheezes or rales. Normal effort    GI/Abdomen: BS +, non-tender, non-distended Ext: no clubbing, cyanosis, or edema Psych: pleasant and cooperative, cheerful Skin: + Scaling dried skin on bilateral heels left buttock area no examined as she was up and dressed + Left heel stage III DTI has resolved Neuro: memory improving. Remembers staff and myself by name! Neurologic: AAOx2, fair insight and awareness.Marland Kitchen  Phonation and speech intelligability much improved. Fair insight. MAS 1 right elbow,MAS  tr to 1 in RIght finger and wrist flexors  MAS 1 in extensors of LE's MAS 1 L elbow , MAS 1+ in L wrist and finger flexors. Hypersensitivity to left hand improved, Left sided strength improved, decreased range of motion and pain at end range of motion in left shoulder, sable 1/16  Musculoskeletal:some  limitations in right shoulder ROM     Assessment/Plan: 1. Functional deficits which require 3+ hours per day of interdisciplinary therapy in a comprehensive inpatient rehab setting. Physiatrist is providing close team supervision and 24 hour management of active medical problems listed below. Physiatrist and rehab team continue to assess barriers to discharge/monitor patient progress toward functional and medical goals  Care Tool:  Bathing    Body parts bathed by patient: Face, Abdomen, Chest, Left upper leg, Right upper leg, Left arm, Front perineal area, Right arm, Right lower leg, Left lower leg, Buttocks   Body parts bathed by helper: Buttocks Body parts n/a: Right arm, Left arm, Front perineal area, Buttocks, Right upper leg, Left upper leg, Right lower leg, Left lower leg   Bathing assist Assist Level: Supervision/Verbal cueing     Upper Body Dressing/Undressing Upper body dressing   What is the patient wearing?: Bra, Pull over shirt    Upper body assist Assist Level: Minimal Assistance - Patient > 75%    Lower Body Dressing/Undressing Lower body dressing  What is the patient wearing?: Underwear/pull up, Pants     Lower body assist Assist for lower body dressing: Minimal Assistance - Patient > 75%     Toileting Toileting Toileting Activity did not occur (Clothing management and hygiene only): N/A (no void or bm)  Toileting assist Assist for toileting: Supervision/Verbal cueing     Transfers Chair/bed transfer  Transfers assist  Chair/bed transfer activity did not occur: Safety/medical concerns  Chair/bed transfer assist level: Supervision/Verbal cueing Chair/bed transfer assistive device: Walker, Archivist   Ambulation assist   Ambulation activity did not occur: Safety/medical concerns  Assist level: Contact Guard/Touching assist Assistive device: Walker-rolling Max distance: 300'   Walk 10 feet activity   Assist   Walk 10 feet activity did not occur: Safety/medical concerns  Assist level: Contact Guard/Touching assist Assistive device: Walker-rolling   Walk 50 feet activity   Assist Walk 50 feet with 2 turns activity did not occur: Safety/medical concerns  Assist level: Contact Guard/Touching assist Assistive device: Walker-rolling    Walk 150 feet activity   Assist Walk 150 feet activity did not occur: Safety/medical concerns  Assist level: Contact Guard/Touching assist Assistive device: Walker-rolling    Walk 10 feet on uneven surface  activity   Assist Walk 10 feet on uneven surfaces activity did not occur: Safety/medical concerns   Assist level: Minimal Assistance - Patient > 75% Assistive device: Walker-rolling   Wheelchair     Assist Is the patient using a wheelchair?: No Type of Wheelchair: Manual    Wheelchair assist level: Moderate Assistance - Patient 50 - 74% Max wheelchair distance: 36ft    Wheelchair 50 feet with 2 turns activity    Assist    Wheelchair 50 feet with 2 turns activity did not occur: Safety/medical concerns   Assist Level: Supervision/Verbal cueing   Wheelchair 150 feet activity     Assist  Wheelchair 150 feet activity did not occur: Safety/medical concerns   Assist Level: Moderate Assistance - Patient 50 - 74%   Blood pressure 100/62, pulse 82, temperature 98 F (36.7 C), resp. rate 18, height 5\' 1"  (1.549 m), weight 58 kg, SpO2 97%.  Medical Problem List and Plan: 1. 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 Awaiting approval of disability. Placement pending. Hoping that hospital will  provide LOG for placement. Code status changed to full code after obtaining patient consent Continue CIR therapies including PT, OT, and SLP  Family and team conferences held 1/15, discussed her progress, discussed that mom plans to pursue abandonment as she feels unable to care for Ms. Fluke at home, discussed that Ms Purdue is currently MinA and we can do caregiver training with mother next week to show her what assistance she needs Discussed plan for discharge on Friday  2.  Impaired mobility: d/c lovenox since ambulating >150 feet, continue SCDs  3. Diffuse pains: resolved, gabapentin d/ced  4. History of anxiety: discussed with her mom that she feels this was a big risk factor for her accident, propanolol d/ced due to hypotension. Asked SW to place her on neuropsych list, Buspar ordered prn, continue this. Discussed placing outpatient referral for behavioral health f/u, discussed that  Buspar has been helpful for her.  5. Neuropsych/cognition: This patient is not capable of making decisions on her own behalf  - 10/12: Still not capable of medical decision making; BUT oriented and communicating, following directions - MUCH improved - PRN melatonin 3mg    for insomnia  - Buspar prn  6. Skin: -Buttock/sacrum--foam dressing, pressure relief, nutrition -stage 3 left heel, continue foam dressing/ pressure relief  7. Fluids/Electrolytes/Nutrition: Routine in and outs with follow-up chemistries  -Eating  well with cueing   -Dysphagia: continue D3/thins--tolerating this diet -Low protein stores: d/c prosource since patient hates these and is eating 100% of meals -Suboptimal vitamin D level: increase to 5,000 U daily. continue this dose  -History of magnesium deficiency: magnesium 500mg  stopped to support BP  - good po intake  8.  Cavitary right lower lobe pneumonia likely aspiration pneumonia/MRSA pneumonia.  Resolved    Latest Ref Rng & Units 05/30/2023    5:25 AM 05/16/2023    6:28 AM  05/02/2023    5:20 AM  CBC  WBC 4.0 - 10.5 K/uL 5.3  6.2  5.9   Hemoglobin 12.0 - 15.0 g/dL 82.9  56.2  13.0   Hematocrit 36.0 - 46.0 % 41.5  42.1  43.0   Platelets 150 - 400 K/uL 223  300  241      9.  History of drug abuse.  Positive cocaine on urine drug screen.  Provide counseling  10.  AKI/hypovolemia and ATN.  Resolved     Latest Ref Rng & Units 05/30/2023    5:25 AM 05/16/2023    6:28 AM 05/02/2023    5:20 AM  BMP  Glucose 70 - 99 mg/dL 87  87  89   BUN 6 - 20 mg/dL 10  15  14    Creatinine 0.44 - 1.00 mg/dL 8.65  7.84  6.96   Sodium 135 - 145 mmol/L 137  136  138   Potassium 3.5 - 5.1 mmol/L 3.9  3.9  4.0   Chloride 98 - 111 mmol/L 107  105  109   CO2 22 - 32 mmol/L 25  22  23    Calcium 8.9 - 10.3 mg/dL 8.6  8.9  8.9     11.  Mild transaminitis with rhabdomyolysis.  Both resolved  12.  Hypotension:  D/c flomax  13. HTN: resolved   14. Impaired initiation: resolved, d/c amantadine  15. Hyperglycemia: CBGs mildly elevated, d/ced checks. Prosource d/ced as per patient's request  16. Spasticity:  Continue wrist, elbow brace, and PRAFOs. Ordered a night splint for LLE  D/c baclofen and tizanidine- much improved  17.  Dyskinetic movements in head and neck -  -  resolved   18. Cervical extensor weakness: improved, continue strengthening exercises  19. Bowel and bladder incontinence: continue bowel and bladder program. Flomax started for urgency  20. Fatigue: resolved, d/c b complex   21. MRSA nares positive: negative on repeat, precautions d/ced  22. Tachycardic: resolved  23. S/p fall: CT head reviewed and is stable  24. Bilateral foot drop: bilateral AFOs ordered, discussed that these can help her to walk better, continue  25. Vaginal itching: resolved  26. Dysuria resolved  27. Poor sitting balance: continue OT/PT, improving  28. Headaches: topamax 53m daily prn ordered, will d/c since not requiring  29. Drowsiness with tramadol: d/ced  30. IUD  in place: Pt states she has IUD x13yrs  and believes it's supposed to be removed soon(chart review- Dr. Seymour Bars placed Mirena IUD on  07/01/19, good for 102yrs); needs outpatient follow-up with OB/GYN  31. Constipation: d/c baclofen, last BM 12/18, milk of magnesia ordered 12/20, resolved, magnesium supplement d/ced  32. History of drug addiction: tramadol d/ced as per mom's preference  33. Depression: discussed scheduling for outpatient psychology/psychiatry follow-up, discussed neuropsych f/u while in hospital  -appreciate Dr. Kieth Brightly follow up  34. Sinusitis: claritin ordered prn, will d/c since not requiring  35. Pruritus: sarna lotion ordered, discussed that this is prn, will d/c since not requiring    LOS: 194 days A FACE TO FACE EVALUATION WAS PERFORMED  Shyenne Maggard P Breindel Collier 05/31/2023, 10:59 AM

## 2023-05-31 NOTE — Progress Notes (Signed)
Physical Therapy Session Note  Patient Details  Name: Nicole [name] MRN: [id number] Date of Birth: 1975-10-30  Today's Date: 05/31/2023 PT Individual Time: 6578-4696 + 1100-1158 PT Individual Time Calculation (min): 59 min  + 58 min  Short Term Goals: Week 1:  PT Short Term Goal 1 (Week 1): Pt will perform supine<>sit with supervision PT Short Term Goal 1 - Progress (Week 1): Other (comment) (new goal) PT Short Term Goal 2 (Week 1): Pt will ambulate at least 166ft with no more than min A, not using an AD. PT Short Term Goal 2 - Progress (Week 1): Other (comment) (new goal) PT Short Term Goal 3 (Week 1): Pt will demonstrate decreased fall risk as noted by an improvement of at least 7 points on the [employer] Test PT Short Term Goal 3 - Progress (Week 1): Other (comment) (new goal) PT Short Term Goal 4 (Week 1): Pt will navigate 4 steps using HRs with no more than CGA  Skilled Therapeutic Interventions/Progress Updates:      1st session: Pt lying in bed, in hospital gown and wants to get dressed. Provided outfit for her and allowed her to complete dressing without assist or cues. Patient completed dressing while seated EOB.  She initially donned sweat pants backwards and needed min cues for awareness; however, she had significant difficulty problem solving and sequencing fixing this. She needed mod cues for taking pants off and readjusting. She even tried to put 2 legs into one leg hole. She then tired to place the pants on inside out.   She was able do remove dirty socks and don new clean ones with min cues via figure-4 technique. Able to don a clean shirt without assist or cues.   Completed oral care while standing sinkside. Patient again having difficulty with apraxia, termination of task, and trying to multi task 2 things at once (brush her hair and brush her teeth at the same time). Pt frustrated with her apraxia and often repeats herself "what am I doing?" Or "why am I doing this?" Pt  feels as though her apraxia is worse than prior days.   Pt ambulated from her room to day room rehab gym, ~143ft, with supervision and RW - cues for upright posture, pacing herself, and reducing UE reliance on RW. Briefly discussed updated DC plan as patient aware of possibility of returning to her mother's home. Patient concerned about caregiver burden and logisitics of living with her mother. Mother is scheduled for family ed/training next week.   Pt returned to her room and left in bed with all her needs met. Direct handoff of care to student nursing.    2nd session: Pt lying in bed - she reports "I'm not happy." With further questioning, patient reports she's incontinent with a wet brief, unhappy with nursing care in their response time. NT entering room when Pt used call bell again - NT assisted with brief change and pericare.   Bed mobility completed with CGA for initiation and safety. Donned slip-on shoes with setupA as she sat EOB. Gait from her room to day room rehab gym ~165ft with supervision and RW - continued safety cues and postural awareness cues.   Coordinated with [employer] rep [employer] for custom wheelchair evaluation - let CIR scheduler know to plan for Tuesday 0900 and this was relayed to patient. Facesheet sent to [employer] for insurance verification.   Worked on graded GMC/FMC with rainbow arc ring activity which was completed in standing with table top  support. Completed using both LUE vs RUE at a time and instructed patient in counting slowly from 1-10 to reduce urge of rushing and work on paced awareness.   On modified plantigrade using wall support and minA for balance - completed runners stretch for heel cord's, wall push ups, and wall slides to encourage lengthening. Added standing stretching for lateral side bends with arms overhead and reaching for toes with knees extended.   Pt ambulated back to her room with supervision and RW and patient electing to return to bed. All  needs met at end of treatment.     Therapy Documentation Precautions:  Precautions Precautions: Fall Precaution Comments: decreased stand balance- reliant on UE, Pain in LLE limits mobility at times Required Braces or Orthoses: Other Brace Other Brace: B adjustable night splints; B elbow "cosey" splints; L palm protector; R mitt Restrictions Weight Bearing Restrictions Per Provider Order: No General:    Therapy/Group: Individual Therapy  Nicole [name] 05/31/2023, 7:47 AM

## 2023-06-01 NOTE — Progress Notes (Signed)
Physical Therapy Weekly Progress Note  Patient Details  Name: Nicole [name] MRN: [id number] Date of Birth: [birth year]  Beginning of progress report period: May 25, 2023 End of progress report period: June 01, 2023   Nicole [name] has continued to progress functionally with physical therapy. She currently is at a supervision level for bed mobility, CGA for transfers using the RW, and ambulating with CGA/minA using the RW. She continues to be primarily limited by motor apraxia, joint tightness, decreased activity tolerance, mild impulsivity and impaired safety awareness.   Patient continues to demonstrate the following deficits muscle weakness and muscle joint tightness, decreased cardiorespiratoy endurance, impaired timing and sequencing, abnormal tone, unbalanced muscle activation, motor apraxia, decreased coordination, and decreased motor planning, decreased motor planning, decreased initiation, decreased attention, decreased awareness, decreased problem solving, decreased safety awareness, decreased memory, and delayed processing, and decreased sitting balance, decreased standing balance, decreased postural control, hemiplegia, and decreased balance strategies and therefore will continue to benefit from skilled PT intervention to increase functional independence with mobility.  Patient progressing toward long term goals..  Continue plan of care.  PT Short Term Goals Week 1:  PT Short Term Goal 1 (Week 1): Pt will perform supine<>sit with supervision PT Short Term Goal 1 - Progress (Week 1): Other (comment) (new goal) PT Short Term Goal 2 (Week 1): Pt will ambulate at least 169ft with no more than min A, not using an AD. PT Short Term Goal 2 - Progress (Week 1): Other (comment) (new goal) PT Short Term Goal 3 (Week 1): Pt will demonstrate decreased fall risk as noted by an improvement of at least 7 points on the [employer] Test PT Short Term Goal 3 - Progress (Week 1): Other (comment) (new  goal) PT Short Term Goal 4 (Week 1): Pt will navigate 4 steps using HRs with no more than CGA   Therapy Documentation Precautions:  Precautions Precautions: Fall Precaution Comments: decreased stand balance- reliant on UE, Pain in LLE limits mobility at times Required Braces or Orthoses: Other Brace Other Brace: B adjustable night splints; B elbow "cosey" splints; L palm protector; R mitt Restrictions Weight Bearing Restrictions Per Provider Order: No General:    Daryana Whirley P Jayra Choyce  PT, DPT, CSRS  06/01/2023, 7:42 AM

## 2023-06-01 NOTE — Progress Notes (Signed)
Speech Language Pathology Weekly Progress and Session Note  Patient Details  Name: Nicole [name] MRN: [id number] Date of Birth: Aug 10, 1975  Beginning of progress report period: May 24, 2023 End of progress report period: June 01, 2023  Today's Date: 06/01/2023 SLP Individual Time: 0806-0900 SLP Individual Time Calculation (min): 54 min  Short Term Goals: Week 9: SLP Short Term Goal 1 (Week 9): Week 27 - Pt will solve mildly complex environmental problems with 70% accuracy given min assist. SLP Short Term Goal 1 - Progress (Week 9): Not met SLP Short Term Goal 2 (Week 9): Week 27 - Pt will recall basic daily information using external aids with 90% accuracy given supervision assist. SLP Short Term Goal 2 - Progress (Week 9): Met SLP Short Term Goal 3 (Week 9): Week 27 - Pt will utilize recommended handwriting strategies to achieve 90% legebility given supervision verbal cues in 4/5 opportunities at the phrase and sentence levels. SLP Short Term Goal 3 - Progress (Week 9): Met SLP Short Term Goal 4 (Week 9): Week 27 - Pt will utilize increased vocal intensity w/ supervision verbal cues to remain 90% intelligibile at conversation level in noisy environments in 4/5 opportunities. SLP Short Term Goal 4 - Progress (Week 9): Met  New Short Term Goals: Week 9: SLP Short Term Goal 1 (Week 9): Week 28 - Pt will solve mildly complex environmental problems with 70% accuracy given min assist. SLP Short Term Goal 2 (Week 9): Week 28 - Pt will recall basic daily information using external aids with 95% accuracy given supervision assist. SLP Short Term Goal 3 (Week 9): Week 28 - Pt will utilize recommended handwriting strategies to achieve 100% legebility given supervision verbal cues in 4/5 opportunities at the phrase and sentence levels. SLP Short Term Goal 4 (Week 9): Week 28 - Pt will utilize increased vocal intensity w/ supervision verbal cues to remain 95% intelligibile at conversation level in  noisy environments in 4/5 opportunities.  Weekly Progress Updates: Patient has made excellent progress towards therapy goals, meeting 3/4 short term goals set this reporting period. Patient benefits from overall min-mod assist for mildly complex environmental problem solving (min for more familiar/automatic responses to problems, mod for less familiar). Patient is 90% intelligible in noisy environments given supervision and handwriting was 90% legible at the sentence level at most recent session, also with supervision. Patient and family education ongoing. Patient will continue to benefit from skilled therapy services during remainder of CIR stay.    Intensity: Minumum of 1-2 x/day, 30 to 90 minutes Frequency: 3 to 5 out of 7 days Duration/Length of Stay: TBD due to SNF placement Treatment/Interventions: Cognitive remediation/compensation;Internal/external aids;Speech/Language facilitation;Cueing hierarchy;Environmental controls;Therapeutic Activities;Functional tasks;Multimodal communication approach;Patient/family education;Therapeutic Exercise  Daily Session  Skilled Therapeutic Interventions: SLP conducted skilled therapy session targeting cognitive retraining goals. SLP facilitated money management tasks with simulated money. Throughout tasks, patient completed basic to mildly complex money addition problems as well as counted money to accurately obtain results of math equation, requiring overall min to mod assist. Patient benefited from writing requested amount down on paper and referring to this paper while counting to remain oriented to goal. Throughout session, patient oriented to date and plans for impending discharge with overall modI. Patient's speech 100% intelligible with modI. Patient was left in lowered bed with call bell in reach and bed alarm set. SLP will continue to target goals per plan of care.       Pain  Pain Assessment Pain Scale: 0-10 Pain Score:  0-No pain  Therapy/Group:  Individual Therapy  [name], M.A., CCC-SLP  [name] 06/01/2023, 12:44 PM

## 2023-06-01 NOTE — Plan of Care (Signed)
Slept well last night. Remains incontinent quite often. Toileted as needed. No new changes to report. Will continue care as planned. Problem: Consults Goal: RH STROKE PATIENT EDUCATION Description: See Patient Education module for education specifics  Outcome: Progres Problem: RH BOWEL ELIMINATION Goal: RH STG MANAGE BOWEL WITH ASSISTANCE Description: STG Manage Bowel with mod I Assistance. Outcome: Progressing Goal: RH STG MANAGE BOWEL W/MEDICATION W/ASSISTANCE Description: STG Manage Bowel with Medication with mod I Assistance. Outcome: Progressing   Problem: RH BLADDER ELIMINATION Goal: RH STG MANAGE BLADDER WITH ASSISTANCE Description: STG Manage Bladder With toileting Assistance Outcome: Progressing   Problem: RH SKIN INTEGRITY Goal: RH STG MAINTAIN SKIN INTEGRITY WITH ASSISTANCE Description: STG Maintain Skin Integrity With min  Assistance. Outcome: Progressing

## 2023-06-01 NOTE — Progress Notes (Signed)
Physical Therapy Session Note  Patient Details  Name: Nicole [name] MRN: [id number] Date of Birth: 25-Dec-1975  Today's Date: 06/01/2023 PT Individual Time: 1015-1100 + 1300-1414 PT Individual Time Calculation (min): 45 min  + 74 min  Short Term Goals: Week 1:  PT Short Term Goal 1 (Week 1): Pt will perform supine<>sit with supervision PT Short Term Goal 1 - Progress (Week 1): Other (comment) (new goal) PT Short Term Goal 2 (Week 1): Pt will ambulate at least 181ft with no more than min A, not using an AD. PT Short Term Goal 2 - Progress (Week 1): Other (comment) (new goal) PT Short Term Goal 3 (Week 1): Pt will demonstrate decreased fall risk as noted by an improvement of at least 7 points on the [employer] Test PT Short Term Goal 3 - Progress (Week 1): Other (comment) (new goal) PT Short Term Goal 4 (Week 1): Pt will navigate 4 steps using HRs with no more than CGA  Skilled Therapeutic Interventions/Progress Updates:      1st session: Pt sitting in wheelchair and agreeable to therapy. Pt asking for a more comfortable wheelchair to improve sitting tolerance OOB.   Pt propelled herself at wheelchair level with supervision (using BLE with cushion removed) to main rehab gym, ~150ft. She participated in wheelchair mobility training by weaving in/out of cones spaced ~9ft apart. Cues for wide turns, increasing awareness, and general safety precautions. Patient able to use her BUE as well but not as fast/quick.   Retrieved at CAT 4 Ki Mobility wheelchair to demo to improve efficiency, help with comfort, and improve stability in the chair. Pt propelled this with supervision assist using her BUE however she has limited B shoulder extension due to joint tightness and has difficulty reaching behind her adequately enough to provide sufficient power for wheelchair strokes. Switched out CAT 4 to a CAT 5 to allow for adjustable axil's and improve rear wheel placement in a more forward direction. Patient  able to propel more quickly, powerfully, and more efficiently with this CAT 5 wheelchair. Adjusted tension back to improve her back support as well.  Returned to her room and patient ended treatment sitting in wheelchair with her chair pad alarm on and call bell within reach.     2nd session: Pt sitting in wheelchair - agreeable to PT tx. Has no complaints of pain. Patient reports improved comfort and OOB tolerance with the K0005 wheelchair (Helio A6). Pt transported to main rehab gym for time management to continue working on wheelchair management.  K0005 time and # of strokes to propel 30 ft:  -15 seconds, 16 strokes *Pt with very small and fast push's, has decreased shoulder extension, and difficulty with GMC/FMC coordination. Sometimes gripping the rim's instead of pushing, causing abrupt stops.   Pt instructed in floor transfers - she was able to complete x2 floor transfers with only supervision assist!!! She completed the first one in 15 seconds !!!!! And the 2nd one in 12 seconds!!!!!!!!!!!!!!!!!!!!!  Propelled her wheelchair 144ft with supervision with cues for hand placement, straight path management, and improve efficiency. Patient requires min cues for managing doorways to avoid hitting her hands.   Completed car transfer x2 times with CGA and no AD - worked on problem solving, sequencing, and w/c management for transfers. Cues needed for removing leg rests (or at least flipping them up) prior to standing. Also requires cues for sitting before getting her LE in/out.   Pt instructed in stair training using the 6"  steps and 2 hand rails. She needed minA for safely navigating up and down x12 steps. Pt completes with a self selected reciprocal stepping - cues needed for pacing herself and to avoid rushing - has some apraxia while descending the stairs and needs some cues for safety and sequencing.   Returned to he room at wheelchair level and patient left sitting upright with chair pad  alarm on, all her needs met.     Therapy Documentation Precautions:  Precautions Precautions: Fall Precaution Comments: decreased stand balance- reliant on UE, Pain in LLE limits mobility at times Required Braces or Orthoses: Other Brace Other Brace: B adjustable night splints; B elbow "cosey" splints; L palm protector; R mitt Restrictions Weight Bearing Restrictions Per Provider Order: No General:     Therapy/Group: Individual Therapy  Beau Ramsburg P Magdalene Tardiff PT 06/01/2023, 7:45 AM

## 2023-06-01 NOTE — Progress Notes (Signed)
PROGRESS NOTE   Subjective/Complaints: She is excited to go home next week, prefers this to a group home Has no new complaints this morning Slept well last night Ambulating CG MinA  ROS: as per HPI. Denies CP, SOB, abd pain, N/V/D/C, pain or any other complaints at this time.      Objective:   No results found.   Recent Labs    05/30/23 0525  WBC 5.3  HGB 14.2  HCT 41.5  PLT 223           Recent Labs    05/30/23 0525  NA 137  K 3.9  CL 107  CO2 25  GLUCOSE 87  BUN 10  CREATININE 0.68  CALCIUM 8.6*            Intake/Output Summary (Last 24 hours) at 06/01/2023 1106 Last data filed at 05/31/2023 1826 Gross per 24 hour  Intake 720 ml  Output --  Net 720 ml           Physical Exam: Vital Signs Blood pressure (!) 102/44, pulse 71, temperature 98 F (36.7 C), resp. rate 17, height 5\' 1"  (1.549 m), weight 58 kg, SpO2 99%.  Constitutional: No distress . Vital signs reviewed. Resting in bed HEENT: NCAT, EOMI, oral membranes moist Neck: supple Cardiovascular: RRR without murmur. No JVD    Respiratory/Chest: CTA Bilaterally without wheezes or rales. Normal effort    GI/Abdomen: BS +, non-tender, non-distended Ext: no clubbing, cyanosis, or edema Psych: pleasant and cooperative, cheerful Skin: + Scaling dried skin on bilateral heels left buttock area no examined as she was up and dressed + Left heel stage III DTI has resolved Neuro: memory improving. Remembers staff and myself by name! Neurologic: AAOx2, fair insight and awareness.Marland Kitchen  Phonation and speech intelligability much improved. Fair insight. MAS 1 right elbow,MAS  tr to 1 in RIght finger and wrist flexors  MAS 1 in extensors of LE's MAS 1 L elbow , MAS 1+ in L wrist and finger flexors. Hypersensitivity to left hand improved, Left sided strength improved, decreased range of motion and pain at end range of motion in left shoulder,  stable 1/17  Musculoskeletal:some limitations in right shoulder ROM     Assessment/Plan: 1. Functional deficits which require 3+ hours per day of interdisciplinary therapy in a comprehensive inpatient rehab setting. Physiatrist is providing close team supervision and 24 hour management of active medical problems listed below. Physiatrist and rehab team continue to assess barriers to discharge/monitor patient progress toward functional and medical goals  Care Tool:  Bathing    Body parts bathed by patient: Face, Abdomen, Chest, Left upper leg, Right upper leg, Left arm, Front perineal area, Right arm, Right lower leg, Left lower leg, Buttocks   Body parts bathed by helper: Buttocks Body parts n/a: Right arm, Left arm, Front perineal area, Buttocks, Right upper leg, Left upper leg, Right lower leg, Left lower leg, Chest, Abdomen, Face   Bathing assist Assist Level: Supervision/Verbal cueing     Upper Body Dressing/Undressing Upper body dressing   What is the patient wearing?: Bra, Pull over shirt    Upper body assist Assist Level: Contact Guard/Touching assist  Lower Body Dressing/Undressing Lower body dressing      What is the patient wearing?: Underwear/pull up, Pants     Lower body assist Assist for lower body dressing: Contact Guard/Touching assist     Toileting Toileting Toileting Activity did not occur (Clothing management and hygiene only): N/A (no void or bm)  Toileting assist Assist for toileting: Supervision/Verbal cueing     Transfers Chair/bed transfer  Transfers assist  Chair/bed transfer activity did not occur: Safety/medical concerns  Chair/bed transfer assist level: Supervision/Verbal cueing Chair/bed transfer assistive device: Walker, Archivist   Ambulation assist   Ambulation activity did not occur: Safety/medical concerns  Assist level: Contact Guard/Touching assist Assistive device: Walker-rolling Max distance:  300'   Walk 10 feet activity   Assist  Walk 10 feet activity did not occur: Safety/medical concerns  Assist level: Contact Guard/Touching assist Assistive device: Walker-rolling   Walk 50 feet activity   Assist Walk 50 feet with 2 turns activity did not occur: Safety/medical concerns  Assist level: Contact Guard/Touching assist Assistive device: Walker-rolling    Walk 150 feet activity   Assist Walk 150 feet activity did not occur: Safety/medical concerns  Assist level: Contact Guard/Touching assist Assistive device: Walker-rolling    Walk 10 feet on uneven surface  activity   Assist Walk 10 feet on uneven surfaces activity did not occur: Safety/medical concerns   Assist level: Minimal Assistance - Patient > 75% Assistive device: Walker-rolling   Wheelchair     Assist Is the patient using a wheelchair?: No Type of Wheelchair: Manual    Wheelchair assist level: Moderate Assistance - Patient 50 - 74% Max wheelchair distance: 68ft    Wheelchair 50 feet with 2 turns activity    Assist    Wheelchair 50 feet with 2 turns activity did not occur: Safety/medical concerns   Assist Level: Supervision/Verbal cueing   Wheelchair 150 feet activity     Assist  Wheelchair 150 feet activity did not occur: Safety/medical concerns   Assist Level: Moderate Assistance - Patient 50 - 74%   Blood pressure (!) 102/44, pulse 71, temperature 98 F (36.7 C), resp. rate 17, height 5\' 1"  (1.549 m), weight 58 kg, SpO2 99%.  Medical Problem List and Plan: 1. 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 Awaiting approval of disability.  Placement pending. Hoping that hospital will provide LOG for placement. Code status changed to full code after obtaining patient consent Continue CIR therapies including PT, OT, and SLP  Family and team conferences held 1/15, discussed her progress, discussed that mom plans to pursue abandonment as she feels unable to care for Ms. Huss at home, discussed that Ms Crutchley is currently MinA and we can do caregiver training with mother next week to show her what assistance she needs Discussed plan for discharge on Friday  2.  Impaired mobility: d/c lovenox since ambulating >150 feet, continue SCDs  3. Diffuse pains: resolved, gabapentin d/ced  4. History of anxiety: discussed with her mom that she feels this was a big risk factor for her accident, propanolol d/ced due to hypotension. Asked SW to place her on neuropsych list, Buspar ordered prn, continue this. Discussed  placing outpatient referral for behavioral health f/u, discussed that Buspar has been helpful for her.  5. Neuropsych/cognition: This patient is not capable of making decisions on her own behalf  - 10/12: Still not capable of medical decision making; BUT oriented and communicating, following directions - MUCH improved - PRN melatonin 3mg    for insomnia  - Buspar prn, continue 6. Skin: -Buttock/sacrum--foam dressing, pressure relief, nutrition -stage 3 left heel, continue foam dressing/ pressure relief  7. Fluids/Electrolytes/Nutrition: Routine in and outs with follow-up chemistries  -Eating  well with cueing   Upgraded to regular diet   8.  Cavitary right lower lobe pneumonia likely aspiration pneumonia/MRSA pneumonia.  Resolved    Latest Ref Rng & Units 05/30/2023    5:25 AM 05/16/2023    6:28 AM 05/02/2023    5:20 AM  CBC  WBC 4.0 - 10.5 K/uL 5.3  6.2  5.9   Hemoglobin 12.0 - 15.0 g/dL 40.9  81.1  91.4   Hematocrit 36.0 - 46.0 % 41.5  42.1  43.0   Platelets 150 - 400 K/uL 223  300  241      9.  History of drug abuse.   Positive cocaine on urine drug screen.  Provide counseling  10.  AKI/hypovolemia and ATN.  Resolved     Latest Ref Rng & Units 05/30/2023    5:25 AM 05/16/2023    6:28 AM 05/02/2023    5:20 AM  BMP  Glucose 70 - 99 mg/dL 87  87  89   BUN 6 - 20 mg/dL 10  15  14    Creatinine 0.44 - 1.00 mg/dL 7.82  9.56  2.13   Sodium 135 - 145 mmol/L 137  136  138   Potassium 3.5 - 5.1 mmol/L 3.9  3.9  4.0   Chloride 98 - 111 mmol/L 107  105  109   CO2 22 - 32 mmol/L 25  22  23    Calcium 8.9 - 10.3 mg/dL 8.6  8.9  8.9     11.  Mild transaminitis with rhabdomyolysis.  Both resolved  12.  Hypotension:  D/c flomax, continue to monitor BP TID  13. HTN: resolved   14. Impaired initiation: resolved, d/c amantadine  15. Hyperglycemia: CBGs mildly elevated, d/ced checks. Prosource d/ced as per patient's request  16. Spasticity:  continue wrist, elbow brace, and PRAFOs. Ordered a night splint for LLE  D/c baclofen and tizanidine- much improved  17.  Dyskinetic movements in head and neck -  -  resolved   18. Cervical extensor weakness: improved, continue strengthening exercises  19. Bowel and bladder incontinence: continue bowel and bladder program. Flomax started for urgency  20. Fatigue: resolved, d/c b complex   21. MRSA nares positive: negative on repeat, precautions d/ced  22. Tachycardic: resolved  23. S/p fall: CT head reviewed and is stable  24. Bilateral foot drop: bilateral AFOs ordered, discussed that these can help her to walk better, continue  25. Vaginal itching: resolved  26. Dysuria resolved  27. Poor sitting balance: continue OT/PT, improving  28. Headaches: topamax 30m daily prn ordered, will d/c since not requiring  29. Drowsiness with tramadol: d/ced  30. IUD in place: Pt states she has IUD x74yrs  and believes it's supposed to be removed soon(chart review- Dr. Seymour Bars placed Mirena IUD on 07/01/19, good for 55yrs); needs outpatient follow-up with OB/GYN  31.  Constipation: d/c baclofen, last BM 12/18, milk of magnesia ordered 12/20, resolved, magnesium supplement d/ced  32. History  of drug addiction: tramadol d/ced as per mom's preference  33. Depression: discussed scheduling for outpatient psychology/psychiatry follow-up, discussed neuropsych f/u while in hospital  -appreciate Dr. Kieth Brightly follow up  34. Sinusitis: claritin ordered prn, will d/c since not requiring  35. Pruritus: sarna lotion ordered, discussed that this is prn, will d/c since not requiring    LOS: 195 days A FACE TO FACE EVALUATION WAS PERFORMED  Ranae Casebier P Dolores Mcgovern 06/01/2023, 11:06 AM

## 2023-06-01 NOTE — Progress Notes (Signed)
Patient ID: Nicole [name], female   DOB: 1975/11/30, 48 y.o.   MRN: [id number]  Have faxed pt's medical records for SSD x 2. Social Security has not received yet. Have reached out to [facility] to send them electronically since she reports she has sent the initial ones. She had left for the day. Follow up with on Monday. Have not heard from Mom today. Pt upset with worker feels made mom cry.

## 2023-06-01 NOTE — Progress Notes (Signed)
Occupational Therapy Session Note  Patient Details  Name: Nicole [name] MRN: [id number] Date of Birth: 11-Aug-1975  Today's Date: 06/01/2023 OT Individual Time: 0930-1000 OT Individual Time Calculation (min): 30 min    Short Term Goals: Week 7:  OT Short Term Goal 1 (Week 7): Patient will complete hair care including putting hair into ponytail with intermittent min asssit OT Short Term Goal 1 - Progress (Week 7): Other (comment) OT Short Term Goal 2 (Week 7): Patient will prepare simple snack preparation with CGA OT Short Term Goal 2 - Progress (Week 7): Other (comment) OT Short Term Goal 3 (Week 7): Patient will complete laundry task with contact guard assist OT Short Term Goal 3 - Progress (Week 7): Other (comment) OT Short Term Goal 4 (Week 7): xxx OT Short Term Goal 4 - Progress (Week 7): Other (comment) OT Short Term Goal 5 (Week 7): xxx OT Short Term Goal 5 - Progress (Week 7): Other (comment)  Skilled Therapeutic Interventions/Progress Updates:    1:1 Pt received in the bathroom with NT. Pt ambulated out with RW with supervision to w/c. Focus on dressing (to include brief, pants, bra and shirt and shoes). With extra time and cues for sequencing pt able to accomplish with extra time. Pt ambulated to the main gym with RW. Continues to present with decr coordination of walking and pushing walker and decr core control in an upright posture with neck flexed down. Pt ambulated 60 feet in the hallway without device with min A with tactile cues for posture and core control. Pt then ambulated from the gym (far end) back to outside her room without device with tactile cues for posture with contact guard. Pt impulsive with turns. Pt left sitting in w/c with chair pad alarm with call bell.   Therapy Documentation Precautions:  Precautions Precautions: Fall Precaution Comments: decreased stand balance- reliant on UE, Pain in LLE limits mobility at times Required Braces or Orthoses: Other  Brace Other Brace: B adjustable night splints; B elbow "cosey" splints; L palm protector; R mitt Restrictions Weight Bearing Restrictions Per Provider Order: No  Pain: Pain Assessment Pain Scale: 0-10 Pain Score: 0-No pain    Therapy/Group: Individual Therapy  Roney Mans [facility] 06/01/2023, 2:56 PM

## 2023-06-02 NOTE — Progress Notes (Signed)
Occupational Therapy Session Note  Patient Details  Name: Nicole [name] MRN: [id number] Date of Birth: 1976-02-23  Today's Date: 06/02/2023 OT Individual Time: 2440-1027 OT Individual Time Calculation (min): 71 min    Short Term Goals: Week 7:  OT Short Term Goal 1 (Week 7): Patient will complete hair care including putting hair into ponytail with intermittent min asssit OT Short Term Goal 1 - Progress (Week 7): Other (comment) OT Short Term Goal 2 (Week 7): Patient will prepare simple snack preparation with CGA OT Short Term Goal 2 - Progress (Week 7): Other (comment) OT Short Term Goal 3 (Week 7): Patient will complete laundry task with contact guard assist OT Short Term Goal 3 - Progress (Week 7): Other (comment) OT Short Term Goal 4 (Week 7): xxx OT Short Term Goal 4 - Progress (Week 7): Other (comment) OT Short Term Goal 5 (Week 7): xxx OT Short Term Goal 5 - Progress (Week 7): Other (comment)  Skilled Therapeutic Interventions/Progress Updates:  Pt greeted supine in bed, pt agreeable to OT intervention.      Transfers/bed mobility/functional mobility: pt completed supine>sit with supervision. Ambulatory transfer into bathroom with Rw and CGA for ADLS  Therapeutic activity: pt completed money mgmt task to challenge problem solving and attention to task. Pt given $470 dollars with pt given a list of "bills" with pt instructed to distribute money amongst required bills. Pt completed task with supervision with + time for processing.    ADLs:  Grooming: pt completed seated oral care at sink with set- up assist, pt needs MIN cues to problem solve through task often stating " I've got to figure out what I'm doing." Pt was able to put her hair in a pony tail today with MOD tactile and verbal cues! UB dressing:pt donned [place] shirt with set- up assist, MAX A to don bra as this as new task for pt LB dressing: donned new brief with MAX A and pants with MODA. Pt is able to assist with pulling  pants to waist line but needs cues to initiate figure 4 position to thread pants.  Footwear: pt able to don socks and shoes with set- up assist.   Bathing: pt completed bathing seated/standing with close supervision, emphasis on problem solving/sequencing bathing tasks Transfers: CGA for all ambulatory transfer with RW Toileting: pt with continent bowel void. MAX A for pericare per pt request for cleanliness  Education: discussed meaning of apraxia and functional implications with pt reporting " I just need more time" to problem solve oral care task.   Ended session with pt supine in bed with all needs within reach and bed alarm activated.                    Therapy Documentation Precautions:  Precautions Precautions: Fall Precaution Comments: decreased stand balance- reliant on UE, Pain in LLE limits mobility at times Required Braces or Orthoses: Other Brace Other Brace: B adjustable night splints; B elbow "cosey" splints; L palm protector; R mitt Restrictions Weight Bearing Restrictions Per Provider Order: No  Pain: no pain reported during session     Therapy/Group: Individual Therapy  Barron Schmid 06/02/2023, 12:13 PM

## 2023-06-02 NOTE — Progress Notes (Signed)
Physical Therapy Session Note  Patient Details  Name: Nicole [name] MRN: [id number] Date of Birth: 17-Dec-1975  Today's Date: 06/02/2023 PT Individual Time: 1345-1428 PT Individual Time Calculation (min): 43 min   Short Term Goals: Week 1:  PT Short Term Goal 1 (Week 1): Pt will perform supine<>sit with supervision PT Short Term Goal 1 - Progress (Week 1): Other (comment) (new goal) PT Short Term Goal 2 (Week 1): Pt will ambulate at least 157ft with no more than min A, not using an AD. PT Short Term Goal 2 - Progress (Week 1): Other (comment) (new goal) PT Short Term Goal 3 (Week 1): Pt will demonstrate decreased fall risk as noted by an improvement of at least 7 points on the [employer] Test PT Short Term Goal 3 - Progress (Week 1): Other (comment) (new goal) PT Short Term Goal 4 (Week 1): Pt will navigate 4 steps using HRs with no more than CGA  Skilled Therapeutic Interventions/Progress Updates:      Pt sitting in wheelchair to start - agreeable to therapy. Has no reports of specific pain.  Sit<>stand to RW with supervision, mildly impulsive with brakes unlocked from wheelchair. Safety education provided for fall prevention.  Ambulated with supervision and RW to main rehab gym, cues for pacing herself, slowing down with turns to improve stability.   Completed Wii Sports Bowling and Yoga - both in unsupported standing position to challenge balance and stamina. Patient able to complete x10 frames of bowling without a seated rest break, needing minA for x1 LOB when she became distracted and had an anterior LOB without adequate stepping strategies to recover. She did demonstrate some apraxia and difficulty with managing Wii Remote so needed min cues for that. Yoga positioning needing minA for stability - completed Half-Moon, Sun-Salutation, Tree pose, and Warrior 1.   Returned to her room and patient left sitting in wheelchair with her chair pad alarm on. All her immediate needs met.    Therapy Documentation Precautions:  Precautions Precautions: Fall Precaution Comments: decreased stand balance- reliant on UE, Pain in LLE limits mobility at times Required Braces or Orthoses: Other Brace Other Brace: B adjustable night splints; B elbow "cosey" splints; L palm protector; R mitt Restrictions Weight Bearing Restrictions Per Provider Order: No General:     Therapy/Group: Individual Therapy  Orrin Brigham 06/02/2023, 7:40 AM

## 2023-06-02 NOTE — Progress Notes (Signed)
PROGRESS NOTE   Subjective/Complaints:  Pt doing great, slept well, denies pain, LBM this morning, urinating ok but still with incontinence. Denies any other complaints or concerns today. Took a shower during OT this morning.   ROS: as per HPI. Denies CP, SOB, abd pain, N/V/D/C, pain or any other complaints at this time.      Objective:   No results found.   No results for input(s): "WBC", "HGB", "HCT", "PLT" in the last 72 hours.          No results for input(s): "NA", "K", "CL", "CO2", "GLUCOSE", "BUN", "CREATININE", "CALCIUM" in the last 72 hours.           Intake/Output Summary (Last 24 hours) at 06/02/2023 1130 Last data filed at 06/01/2023 1800 Gross per 24 hour  Intake 480 ml  Output --  Net 480 ml           Physical Exam: Vital Signs Blood pressure 104/64, pulse 74, temperature 98.6 F (37 C), temperature source Oral, resp. rate 18, height 5\' 1"  (1.549 m), weight 58 kg, SpO2 95%.  Constitutional: No distress . Vital signs reviewed. Sitting in w/c getting dressed HEENT: NCAT, EOMI, oral membranes moist Neck: supple Cardiovascular: RRR without murmur. No JVD    Respiratory/Chest: CTA Bilaterally without wheezes or rales. Normal effort    GI/Abdomen: BS +, soft, non-tender, non-distended Ext: no clubbing, cyanosis, or edema Psych: pleasant and cooperative, cheerful  PRIOR EXAMS: Skin: + Scaling dried skin on bilateral heels left buttock area no examined as she was up and dressed + Left heel stage III DTI has resolved Neuro: memory improving. Remembers staff and myself by name! Neurologic: AAOx2, fair insight and awareness.Marland Kitchen  Phonation and speech intelligability much improved. Fair insight. MAS 1 right elbow,MAS  tr to 1 in RIght finger and wrist flexors  MAS 1 in extensors of LE's MAS 1 L elbow , MAS 1+ in L wrist and finger flexors. Hypersensitivity to left hand improved, Left sided  strength improved, decreased range of motion and pain at end range of motion in left shoulder, stable 1/17  Musculoskeletal:some limitations in right shoulder ROM     Assessment/Plan: 1. Functional deficits which require 3+ hours per day of interdisciplinary therapy in a comprehensive inpatient rehab setting. Physiatrist is providing close team supervision and 24 hour management of active medical problems listed below. Physiatrist and rehab team continue to assess barriers to discharge/monitor patient progress toward functional and medical goals  Care Tool:  Bathing    Body parts bathed by patient: Face, Abdomen, Chest, Left upper leg, Right upper leg, Left arm, Front perineal area, Right arm, Right lower leg, Left lower leg, Buttocks   Body parts bathed by helper: Buttocks Body parts n/a: Right arm, Left arm, Front perineal area, Buttocks, Right upper leg, Left upper leg, Right lower leg, Left lower leg, Chest, Abdomen, Face   Bathing assist Assist Level: Supervision/Verbal cueing     Upper Body Dressing/Undressing Upper body dressing   What is the patient wearing?: Bra, Pull over shirt    Upper body assist Assist Level: Contact Guard/Touching assist    Lower Body Dressing/Undressing Lower body dressing  What is the patient wearing?: Underwear/pull up, Pants     Lower body assist Assist for lower body dressing: Contact Guard/Touching assist     Toileting Toileting Toileting Activity did not occur (Clothing management and hygiene only): N/A (no void or bm)  Toileting assist Assist for toileting: Supervision/Verbal cueing     Transfers Chair/bed transfer  Transfers assist  Chair/bed transfer activity did not occur: Safety/medical concerns  Chair/bed transfer assist level: Supervision/Verbal cueing Chair/bed transfer assistive device: Walker, Archivist   Ambulation assist   Ambulation activity did not occur: Safety/medical  concerns  Assist level: Contact Guard/Touching assist Assistive device: Walker-rolling Max distance: 300'   Walk 10 feet activity   Assist  Walk 10 feet activity did not occur: Safety/medical concerns  Assist level: Contact Guard/Touching assist Assistive device: Walker-rolling   Walk 50 feet activity   Assist Walk 50 feet with 2 turns activity did not occur: Safety/medical concerns  Assist level: Contact Guard/Touching assist Assistive device: Walker-rolling    Walk 150 feet activity   Assist Walk 150 feet activity did not occur: Safety/medical concerns  Assist level: Contact Guard/Touching assist Assistive device: Walker-rolling    Walk 10 feet on uneven surface  activity   Assist Walk 10 feet on uneven surfaces activity did not occur: Safety/medical concerns   Assist level: Minimal Assistance - Patient > 75% Assistive device: Walker-rolling   Wheelchair     Assist Is the patient using a wheelchair?: No Type of Wheelchair: Manual    Wheelchair assist level: Moderate Assistance - Patient 50 - 74% Max wheelchair distance: 76ft    Wheelchair 50 feet with 2 turns activity    Assist    Wheelchair 50 feet with 2 turns activity did not occur: Safety/medical concerns   Assist Level: Supervision/Verbal cueing   Wheelchair 150 feet activity     Assist  Wheelchair 150 feet activity did not occur: Safety/medical concerns   Assist Level: Moderate Assistance - Patient 50 - 74%   Blood pressure 104/64, pulse 74, temperature 98.6 F (37 C), temperature source Oral, resp. rate 18, height 5\' 1"  (1.549 m), weight 58 kg, SpO2 95%.  Medical Problem List and Plan: 1. 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 Awaiting approval of disability. Placement pending. Hoping that hospital will provide LOG for placement. Code status changed to full code after obtaining patient consent Continue CIR therapies including PT, OT, and SLP  Family and team conferences held 1/15, discussed her progress, discussed that mom plans to pursue abandonment as she feels unable to care for Ms. Honea at home, discussed that Ms Mazer is currently MinA and we can do caregiver training with mother next week to show her what assistance she needs Discussed plan for discharge on Friday 06/08/23  2.  Impaired mobility: d/c lovenox since ambulating >150 feet, continue SCDs  3. Diffuse pains: resolved, gabapentin d/ced  4. History of anxiety: discussed with her mom that she feels this was a big risk factor for her accident, propanolol d/ced due to hypotension. Asked SW to place her on neuropsych list, Buspar ordered prn, continue this. Discussed placing outpatient referral for behavioral health f/u, discussed  that Buspar has been helpful for her.  5. Neuropsych/cognition: This patient is not capable of making decisions on her own behalf  - 10/12: Still not capable of medical decision making; BUT oriented and communicating, following directions - MUCH improved - PRN melatonin 3mg    for insomnia  - Buspar prn, continue 6. Skin: -Buttock/sacrum--foam dressing, pressure relief, nutrition -stage 3 left heel, continue foam dressing/ pressure relief  7. Fluids/Electrolytes/Nutrition: Routine in and outs with follow-up chemistries  -Eating  well with cueing   Upgraded to regular diet   8.  Cavitary right lower lobe pneumonia likely aspiration pneumonia/MRSA pneumonia.  Resolved    Latest Ref Rng & Units 05/30/2023    5:25 AM 05/16/2023    6:28 AM 05/02/2023    5:20 AM  CBC  WBC 4.0 - 10.5 K/uL 5.3  6.2  5.9   Hemoglobin 12.0 - 15.0 g/dL 60.4  54.0   98.1   Hematocrit 36.0 - 46.0 % 41.5  42.1  43.0   Platelets 150 - 400 K/uL 223  300  241      9.  History of drug abuse.  Positive cocaine on urine drug screen.  Provide counseling  10.  AKI/hypovolemia and ATN.  Resolved     Latest Ref Rng & Units 05/30/2023    5:25 AM 05/16/2023    6:28 AM 05/02/2023    5:20 AM  BMP  Glucose 70 - 99 mg/dL 87  87  89   BUN 6 - 20 mg/dL 10  15  14    Creatinine 0.44 - 1.00 mg/dL 1.91  4.78  2.95   Sodium 135 - 145 mmol/L 137  136  138   Potassium 3.5 - 5.1 mmol/L 3.9  3.9  4.0   Chloride 98 - 111 mmol/L 107  105  109   CO2 22 - 32 mmol/L 25  22  23    Calcium 8.9 - 10.3 mg/dL 8.6  8.9  8.9     11.  Mild transaminitis with rhabdomyolysis.  Both resolved  12.  Hypotension:  D/c flomax, continue to monitor BP TID -06/02/23 BPs doing great Vitals:   05/29/23 2045 05/30/23 0528 05/30/23 1335 05/30/23 2031  BP: 115/85 104/74 105/75 96/61   05/31/23 0443 05/31/23 1251 05/31/23 2001 06/01/23 0418  BP: 100/62 102/75 105/66 (!) 89/58   06/01/23 0420 06/01/23 1306 06/01/23 1939 06/02/23 0301  BP: (!) 102/44 109/74 100/76 104/64     13. HTN: resolved   14. Impaired initiation: resolved, d/c amantadine  15. Hyperglycemia: CBGs mildly elevated, d/ced checks. Prosource d/ced as per patient's request  16. Spasticity:  continue wrist, elbow brace, and PRAFOs. Ordered a night splint for LLE  D/c baclofen and tizanidine- much improved  17.  Dyskinetic movements in head and neck -  -  resolved   18. Cervical extensor weakness: improved, continue strengthening exercises  19. Bowel and bladder incontinence: continue bowel and bladder program. Flomax started for urgency-- d/c'd  20. Fatigue: resolved, d/c b complex   21. MRSA nares positive: negative on repeat, precautions d/ced  22. Tachycardic: resolved  23. S/p fall: CT head reviewed and is stable  24. Bilateral foot drop: bilateral AFOs ordered, discussed that these can help her to walk  better, continue  25. Vaginal itching: resolved  26. Dysuria resolved  27. Poor sitting balance: continue OT/PT, improving  28. Headaches: topamax 96m daily prn ordered, will d/c since not requiring  29. Drowsiness with tramadol: d/ced  30. IUD in place:  Pt states she has IUD x17yrs  and believes it's supposed to be removed soon(chart review- Dr. Seymour Bars placed Mirena IUD on 07/01/19, good for 20yrs); needs outpatient follow-up with OB/GYN  31. Constipation: d/c baclofen, last BM 12/18, milk of magnesia ordered 12/20, resolved, magnesium supplement d/ced  -06/02/23 LBM today, monitor  32. History of drug addiction: tramadol d/ced as per mom's preference  33. Depression: discussed scheduling for outpatient psychology/psychiatry follow-up, discussed neuropsych f/u while in hospital  -appreciate Dr. Kieth Brightly follow up  34. Sinusitis: claritin ordered prn, will d/c since not requiring  35. Pruritus: sarna lotion ordered, discussed that this is prn, will d/c since not requiring    LOS: 196 days A FACE TO FACE EVALUATION WAS PERFORMED  [address] 06/02/2023, 11:30 AM

## 2023-06-03 NOTE — Progress Notes (Addendum)
PROGRESS NOTE   Subjective/Complaints:  Pt doing great again today, slept well, denies pain, LBM yesterday, urinating ok but still with incontinence. Denies any other complaints or concerns today. Excited to probably go home this week.   ROS: as per HPI. Denies CP, SOB, abd pain, N/V/D/C, pain or any other complaints at this time.      Objective:   No results found.   No results for input(s): "WBC", "HGB", "HCT", "PLT" in the last 72 hours.          No results for input(s): "NA", "K", "CL", "CO2", "GLUCOSE", "BUN", "CREATININE", "CALCIUM" in the last 72 hours.           Intake/Output Summary (Last 24 hours) at 06/03/2023 1036 Last data filed at 06/03/2023 0700 Gross per 24 hour  Intake 1193 ml  Output --  Net 1193 ml           Physical Exam: Vital Signs Blood pressure 103/70, pulse 75, temperature 98.6 F (37 C), temperature source Oral, resp. rate 18, height 5\' 1"  (1.549 m), weight 58 kg, SpO2 100%.  Constitutional: No distress . Vital signs reviewed. Sitting in w/c  HEENT: NCAT, EOMI, oral membranes moist Neck: supple Cardiovascular: RRR without murmur. No JVD    Respiratory/Chest: CTA Bilaterally without wheezes or rales. Normal effort    GI/Abdomen: BS +, soft, non-tender, non-distended Ext: no clubbing, cyanosis, or edema Psych: pleasant and cooperative, cheerful Neuro: memory improving. Remembers staff and myself by name  PRIOR EXAMS: Skin: + Scaling dried skin on bilateral heels left buttock area no examined as she was up and dressed + Left heel stage III DTI has resolved  Neurologic: AAOx2, fair insight and awareness.Marland Kitchen  Phonation and speech intelligability much improved. Fair insight. MAS 1 right elbow,MAS  tr to 1 in RIght finger and wrist flexors  MAS 1 in extensors of LE's MAS 1 L elbow , MAS 1+ in L wrist and finger flexors. Hypersensitivity to left hand improved, Left sided  strength improved, decreased range of motion and pain at end range of motion in left shoulder, stable 1/17  Musculoskeletal:some limitations in right shoulder ROM     Assessment/Plan: 1. Functional deficits which require 3+ hours per day of interdisciplinary therapy in a comprehensive inpatient rehab setting. Physiatrist is providing close team supervision and 24 hour management of active medical problems listed below. Physiatrist and rehab team continue to assess barriers to discharge/monitor patient progress toward functional and medical goals  Care Tool:  Bathing    Body parts bathed by patient: Face, Abdomen, Chest, Left upper leg, Right upper leg, Left arm, Front perineal area, Right arm, Right lower leg, Left lower leg, Buttocks   Body parts bathed by helper: Buttocks Body parts n/a: Right arm, Left arm, Front perineal area, Buttocks, Right upper leg, Left upper leg, Right lower leg, Left lower leg, Chest, Abdomen, Face   Bathing assist Assist Level: Supervision/Verbal cueing     Upper Body Dressing/Undressing Upper body dressing   What is the patient wearing?: Bra, Pull over shirt    Upper body assist Assist Level: Contact Guard/Touching assist    Lower Body Dressing/Undressing Lower body dressing  What is the patient wearing?: Underwear/pull up, Pants     Lower body assist Assist for lower body dressing: Contact Guard/Touching assist     Toileting Toileting Toileting Activity did not occur (Clothing management and hygiene only): N/A (no void or bm)  Toileting assist Assist for toileting: Supervision/Verbal cueing     Transfers Chair/bed transfer  Transfers assist  Chair/bed transfer activity did not occur: Safety/medical concerns  Chair/bed transfer assist level: Supervision/Verbal cueing Chair/bed transfer assistive device: Walker, Archivist   Ambulation assist   Ambulation activity did not occur: Safety/medical  concerns  Assist level: Contact Guard/Touching assist Assistive device: Walker-rolling Max distance: 300'   Walk 10 feet activity   Assist  Walk 10 feet activity did not occur: Safety/medical concerns  Assist level: Contact Guard/Touching assist Assistive device: Walker-rolling   Walk 50 feet activity   Assist Walk 50 feet with 2 turns activity did not occur: Safety/medical concerns  Assist level: Contact Guard/Touching assist Assistive device: Walker-rolling    Walk 150 feet activity   Assist Walk 150 feet activity did not occur: Safety/medical concerns  Assist level: Contact Guard/Touching assist Assistive device: Walker-rolling    Walk 10 feet on uneven surface  activity   Assist Walk 10 feet on uneven surfaces activity did not occur: Safety/medical concerns   Assist level: Minimal Assistance - Patient > 75% Assistive device: Walker-rolling   Wheelchair     Assist Is the patient using a wheelchair?: No Type of Wheelchair: Manual    Wheelchair assist level: Moderate Assistance - Patient 50 - 74% Max wheelchair distance: 35ft    Wheelchair 50 feet with 2 turns activity    Assist    Wheelchair 50 feet with 2 turns activity did not occur: Safety/medical concerns   Assist Level: Supervision/Verbal cueing   Wheelchair 150 feet activity     Assist  Wheelchair 150 feet activity did not occur: Safety/medical concerns   Assist Level: Moderate Assistance - Patient 50 - 74%   Blood pressure 103/70, pulse 75, temperature 98.6 F (37 C), temperature source Oral, resp. rate 18, height 5\' 1"  (1.549 m), weight 58 kg, SpO2 100%.  Medical Problem List and Plan: 1. 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 Awaiting approval of disability. Placement pending. Hoping that hospital will provide LOG for placement. Code status changed to full code after obtaining patient consent Continue CIR therapies including PT, OT, and SLP  Family and team conferences held 1/15, discussed her progress, discussed that mom plans to pursue abandonment as she feels unable to care for Ms. Guallpa at home, discussed that Ms Battle is currently MinA and we can do caregiver training with mother next week to show her what assistance she needs Discussed plan for discharge on Friday 06/08/23  2.  Impaired mobility: d/c lovenox since ambulating >150 feet, continue SCDs  3. Diffuse pains: resolved, gabapentin d/ced  4. History of anxiety: discussed with her mom that she feels this was a big risk factor for her accident, propanolol d/ced due to hypotension. Asked SW to place her on neuropsych list, Buspar ordered prn, continue this. Discussed placing outpatient referral for behavioral health f/u, discussed  that Buspar has been helpful for her.  5. Neuropsych/cognition: This patient is not capable of making decisions on her own behalf  - 10/12: Still not capable of medical decision making; BUT oriented and communicating, following directions - MUCH improved - PRN melatonin 3mg    for insomnia  - Buspar 5mg  TID prn, continue 6. Skin: eucerin cream BID -Buttock/sacrum--foam dressing, pressure relief, nutrition -stage 3 left heel, continue foam dressing/ pressure relief  7. Fluids/Electrolytes/Nutrition: Routine in and outs with follow-up chemistries  -Eating  well with cueing   Upgraded to regular diet  -Vit D 4000U daily  -06/03/23 ordered labs for tomorrow since pt likely going home this week   8.  Cavitary right lower lobe pneumonia likely aspiration pneumonia/MRSA pneumonia.  Resolved    Latest Ref Rng & Units 05/30/2023    5:25 AM  05/16/2023    6:28 AM 05/02/2023    5:20 AM  CBC  WBC 4.0 - 10.5 K/uL 5.3  6.2  5.9   Hemoglobin 12.0 - 15.0 g/dL 16.1  09.6  04.5   Hematocrit 36.0 - 46.0 % 41.5  42.1  43.0   Platelets 150 - 400 K/uL 223  300  241      9.  History of drug abuse.  Positive cocaine on urine drug screen.  Provide counseling  10.  AKI/hypovolemia and ATN.  Resolved     Latest Ref Rng & Units 05/30/2023    5:25 AM 05/16/2023    6:28 AM 05/02/2023    5:20 AM  BMP  Glucose 70 - 99 mg/dL 87  87  89   BUN 6 - 20 mg/dL 10  15  14    Creatinine 0.44 - 1.00 mg/dL 4.09  8.11  9.14   Sodium 135 - 145 mmol/L 137  136  138   Potassium 3.5 - 5.1 mmol/L 3.9  3.9  4.0   Chloride 98 - 111 mmol/L 107  105  109   CO2 22 - 32 mmol/L 25  22  23    Calcium 8.9 - 10.3 mg/dL 8.6  8.9  8.9     11.  Mild transaminitis with rhabdomyolysis.  Both resolved  12.  Hypotension:  D/c flomax, continue to monitor BP TID -1/18-19/25 BPs doing great Vitals:   05/30/23 2031 05/31/23 0443 05/31/23 1251 05/31/23 2001  BP: 96/61 100/62 102/75 105/66   06/01/23 0418 06/01/23 0420 06/01/23 1306 06/01/23 1939  BP: (!) 89/58 (!) 102/44 109/74 100/76   06/02/23 0301 06/02/23 1302 06/02/23 1919 06/03/23 0543  BP: 104/64 116/84 99/64 103/70     13. HTN: resolved   14. Impaired initiation: resolved, d/c amantadine  15. Hyperglycemia: CBGs mildly elevated, d/ced checks. Prosource d/ced as per patient's request  16. Spasticity:  continue wrist, elbow brace, and PRAFOs. Ordered a night splint for LLE  D/c baclofen and tizanidine- much improved  17.  Dyskinetic movements in head and neck -  -  resolved   18. Cervical extensor weakness: improved, continue strengthening exercises  19. Bowel and bladder incontinence: continue bowel and bladder program. Flomax started for urgency-- d/c'd  20. Fatigue: resolved, d/c b complex   21. MRSA nares positive: negative on repeat, precautions d/ced  22. Tachycardic: resolved  23. S/p fall:  CT head reviewed and is stable  24. Bilateral foot drop: bilateral AFOs ordered, discussed that these can help her to walk better, continue  25. Vaginal itching: resolved  26. Dysuria resolved  27. Poor sitting balance: continue OT/PT, improving  28. Headaches: topamax 50m daily prn ordered, will d/c since not requiring  29. Drowsiness with tramadol: d/ced  30. IUD in place: Pt states she has IUD x20yrs  and believes it's supposed to be removed soon(chart review- Dr. Seymour Bars placed Mirena IUD on 07/01/19, good for 67yrs); needs outpatient follow-up with OB/GYN  31. Constipation: d/c baclofen, last BM 12/18, milk of magnesia ordered 12/20, resolved, magnesium supplement d/ced  -06/03/23 LBM yesterday, monitor  32. History of drug addiction: tramadol d/ced as per mom's preference  33. Depression: discussed scheduling for outpatient psychology/psychiatry follow-up, discussed neuropsych f/u while in hospital  -appreciate Dr. Kieth Brightly follow up  34. Sinusitis: claritin ordered prn, will d/c since not requiring  35. Pruritus: sarna lotion ordered, discussed that this is prn, will d/c since not requiring    LOS: 197 days A FACE TO FACE EVALUATION WAS PERFORMED  [address] 06/03/2023, 10:36 AM

## 2023-06-04 DIAGNOSIS — E876 Hypokalemia: Secondary | ICD-10-CM

## 2023-06-04 MED ORDER — MELATONIN 3 MG PO TABS
3.0000 mg | ORAL_TABLET | Freq: Every day | ORAL | Status: DC
  Administered 2023-06-04 – 2023-06-11 (×8): 3 mg via ORAL
  Filled 2023-06-04 (×8): qty 1

## 2023-06-04 NOTE — Progress Notes (Signed)
Physical Therapy Session Note  Patient Details  Name: Nicole [name] MRN: [id number] Date of Birth: 22-Dec-1975  Today's Date: 06/04/2023 PT Individual Time: 0900-1013 PT Individual Time Calculation (min): 73 min   Short Term Goals: Week 1:  PT Short Term Goal 1 (Week 1): Pt will perform supine<>sit with supervision PT Short Term Goal 1 - Progress (Week 1): Other (comment) (new goal) PT Short Term Goal 2 (Week 1): Pt will ambulate at least 119ft with no more than min A, not using an AD. PT Short Term Goal 2 - Progress (Week 1): Other (comment) (new goal) PT Short Term Goal 3 (Week 1): Pt will demonstrate decreased fall risk as noted by an improvement of at least 7 points on the [employer] Test PT Short Term Goal 3 - Progress (Week 1): Other (comment) (new goal) PT Short Term Goal 4 (Week 1): Pt will navigate 4 steps using HRs with no more than CGA  Skilled Therapeutic Interventions/Progress Updates:       Direct handoff of care from NT who was assisting/finishing up with personal care in the bathroom. Pt agreeable to therapy treatment, has no complaints of pain.  Pt requests to brush her teeth and put her hair in a pony tail before leaving her room.  At the sink in standing with supervision for balance ,she completed these tasks. 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 Provided patient with a timer to encourage goal oriented task - unable to put her hair in a pony tail despite given >5 minutes. Difficult for her to initiate at times as well. Similar difficulty with brushing her teeth - needed > 5 minutes to complete oral care with cues for attention to task. She also struggled with trying to complete multiple tasks at once (having hair tie in one hand while trying to complete mouth wash with the other).  MD entering room for morning rounding - discussed difficulty with attention and discussed possibility of  restarting her amantadine or stimulant. MD will review options.   Pt ambulated with supervision and RW to main rehab gym, ~152ft. Pt reports she got in an "argument" with her mother yesterday - specifically regarding DC plan. Pt reports her mother is unwilling to take her home or hire home care - pt unable to provide alternative plan. Reports possibility of getting an apartment to live alone - discussed barriers to this and patient also reports having the "temptation" if she were to live alone (alluding to drug use).   In main rehab gym, completed functional outcome measures.   Patient demonstrates increased fall risk as noted by score of   36/56 on Berg Balance Scale.  (<36= high risk for falls, close to 100%; 37-45 significant >80%; 46-51 moderate >50%; 52-55 lower >25%)  Five times Sit to Stand Test (FTSS) Method: Use a straight back chair with a solid seat that is 16-18" high. Ask participant to sit on the chair with arms folded across their chest.   Instructions: "Stand up and sit down as quickly as possible 5 times, keeping your arms folded across your chest."   Measurement: Stop timing when the participant stands the 5th time.  TIME: 11.5 (in seconds)  Times > 13.6 seconds is associated with increased disability and morbidity (Guralnik, 2000) Times > 15 seconds is predictive of recurrent falls in healthy individuals aged 46 and older (Buatois, et al., 2008) Normal performance values in community  dwelling individuals aged 44 and older (Bohannon, 2006): 60-69 years: 11.4 seconds 70-79 years: 12.6 seconds 80-89 years: 14.8 seconds  MCID: >= 2.3 seconds for Vestibular Disorders (Meretta, 2006)  Timed Up and Go: 16 seconds using RW *scores > 13.5 seconds indicate increased falls risk  Finished session on Nustep x9 minutes at L5 resistance with BUE/BLE to challenge reciprocal stepping, AAROM for shoulders, and terminal extension for B knees. Achieved 489 steps in total.  Returned to  her room and patient left sitting in wheelchair - chair pad alarm on.  Therapy Documentation Precautions:  Precautions Precautions: Fall Precaution Comments: decreased stand balance- reliant on UE, Pain in LLE limits mobility at times Required Braces or Orthoses: Other Brace Other Brace: B adjustable night splints; B elbow "cosey" splints; L palm protector; R mitt Restrictions Weight Bearing Restrictions Per Provider Order: No General:    Balance Balance Assessed: Yes Standardized Balance Assessment Standardized Balance Assessment: Berg Balance Test Berg Balance Test Sit to Stand: Able to stand without using hands and stabilize independently Standing Unsupported: Able to stand 2 minutes with supervision Sitting with Back Unsupported but Feet Supported on Floor or Stool: Able to sit safely and securely 2 minutes Stand to Sit: Sits safely with minimal use of hands Transfers: Able to transfer safely, definite need of hands Standing Unsupported with Eyes Closed: Able to stand 10 seconds with supervision Standing Ubsupported with Feet Together: Able to place feet together independently and stand for 1 minute with supervision From Standing, Reach Forward with Outstretched Arm: Can reach forward >5 cm safely (2") From Standing Position, Pick up Object from Floor: Able to pick up shoe, needs supervision From Standing Position, Turn to Look Behind Over each Shoulder: Turn sideways only but maintains balance Turn 360 Degrees: Able to turn 360 degrees safely but slowly Standing Unsupported, Alternately Place Feet on Step/Stool: Able to complete >2 steps/needs minimal assist Standing Unsupported, One Foot in Front: Needs help to step but can hold 15 seconds Standing on One Leg: Tries to lift leg/unable to hold 3 seconds but remains standing independently Total Score: 36/56  Therapy/Group: Individual Therapy  Orrin Brigham 06/04/2023, 7:43 AM

## 2023-06-04 NOTE — Progress Notes (Signed)
Occupational Therapy Session Note  Patient Details  Name: Nicole [name] MRN: [id number] Date of Birth: June 04, 1975  Today's Date: 06/04/2023 OT Individual Time: 1045-1130 OT Individual Time Calculation (min): 45 min     Skilled Therapeutic Interventions/Progress Updates: Patient received sitting up in w/c, agreeable to OT treatment. Propelled patient in w/c to therapy apartment to work on home safety and obstacle management. Patient Needing assist to manage w/c leg rests and CGA provided when standing to kitchen counter to work on side stepping and reaching activities. Patient able to reach the lowest portion of the upper cabinets without complaint, but due to limited LUE ROM had discomfort when trying to reach higher. Worked on slow knee bend to reach to lower cabinets with CGA provided for safety. Patient able to reach to lowest level of bottom cabinet with hand on counter for safety and therapist close but not required for balance. Continued kitchen tasks with education on safety and positioning of simple items to allow patient to get a cup to drink from or gather a snack if needed. Continued home safety education with ambulation to rocker/recliner - patient with good safety reaching for chair to sit but needed min assist with balance when standing from a moving surface. Patient reports usually sitting on a couch when at home and was able to perform sit- stand from couch without physical assist. Patient taken back to room to perform ADL tasks as listed below. Patient also educated on self ROM using door frame to stretch [facility] rotation and be in control of the tolerable range and amount of stretch provided. Good participation throughout treatment. Continue with skilled OT POC.      Therapy Documentation Precautions:  Precautions Precautions: Fall Precaution Comments: decreased stand balance- reliant on UE, Pain in LLE limits mobility at times Required Braces or Orthoses: Other Brace Other Brace: B  adjustable night splints; B elbow "cosey" splints; L palm protector; R mitt Restrictions Weight Bearing Restrictions Per Provider Order: No    Pain:Patient with painful end range in L shoulder with GH mobilization and education on self ROM   ADL: ADL Grooming: SBA/CGA standing Where Assessed-Grooming: sink Upper Body Dressing: set up Where Assessed-Upper Body Dressing: wheelchair  Toileting: CGA Where Assessed-Toileting: toilet      Other Treatments:     Therapy/Group: Individual Therapy  Warnell Forester 06/04/2023, 11:35 AM

## 2023-06-04 NOTE — Progress Notes (Signed)
Restless first part of shift. Continues to be continent and incontinent of urine. Encouraged patient to clean self and attempt to place brief. PRN melatonin given at 0018. SCD's applied at HS.Alfredo Martinez A

## 2023-06-04 NOTE — Progress Notes (Signed)
PROGRESS NOTE   Subjective/Complaints:  Patient working with therapy brushing her teeth today.  PT has noticed some subtle decreased initiation/focus from a few weeks ago.  ROS: as per HPI. Denies CP, SOB, abd pain, N/V/D/C, new vision changes, pain or any other complaints at this time.      Objective:   No results found.   No results for input(s): "WBC", "HGB", "HCT", "PLT" in the last 72 hours.          No results for input(s): "NA", "K", "CL", "CO2", "GLUCOSE", "BUN", "CREATININE", "CALCIUM" in the last 72 hours.           Intake/Output Summary (Last 24 hours) at 06/04/2023 1249 Last data filed at 06/04/2023 0717 Gross per 24 hour  Intake 716 ml  Output --  Net 716 ml           Physical Exam: Vital Signs Blood pressure 104/68, pulse 75, temperature 98.2 F (36.8 C), temperature source Oral, resp. rate 18, height 5\' 1"  (1.549 m), weight 58 kg, SpO2 96%.  Constitutional: No distress . Vital signs reviewed.  Standing and brushing her teeth working with therapy HEENT: NCAT, EOMI, oral membranes moist Neck: supple Cardiovascular: RRR without murmur. No JVD    Respiratory/Chest: CTA Bilaterally without wheezes or rales. Normal effort    GI/Abdomen: BS +, soft, non-tender, non-distended Ext: no clubbing, cyanosis, or edema Psych: pleasant and cooperative, cheerful Neuro: Awake and alert, follows commands  PRIOR EXAMS: Skin: + Scaling dried skin on bilateral heels left buttock area no examined as she was up and dressed + Left heel stage III DTI has resolved  Neurologic: AAOx2, fair insight and awareness.Marland Kitchen  Phonation and speech intelligability much improved. Fair insight. MAS 1 right elbow,MAS  tr to 1 in RIght finger and wrist flexors  MAS 1 in extensors of LE's MAS 1 L elbow , MAS 1+ in L wrist and finger flexors. Hypersensitivity to left hand improved, Left sided strength improved, decreased  range of motion and pain at end range of motion in left shoulder,  Musculoskeletal:some limitations in right shoulder ROM     Assessment/Plan: 1. Functional deficits which require 3+ hours per day of interdisciplinary therapy in a comprehensive inpatient rehab setting. Physiatrist is providing close team supervision and 24 hour management of active medical problems listed below. Physiatrist and rehab team continue to assess barriers to discharge/monitor patient progress toward functional and medical goals  Care Tool:  Bathing    Body parts bathed by patient: Face, Abdomen, Chest, Left upper leg, Right upper leg, Left arm, Front perineal area, Right arm, Right lower leg, Left lower leg, Buttocks   Body parts bathed by helper: Buttocks Body parts n/a: Right arm, Left arm, Front perineal area, Buttocks, Right upper leg, Left upper leg, Right lower leg, Left lower leg, Chest, Abdomen, Face   Bathing assist Assist Level: Supervision/Verbal cueing     Upper Body Dressing/Undressing Upper body dressing   What is the patient wearing?: Pull over shirt    Upper body assist Assist Level: Set up assist    Lower Body Dressing/Undressing Lower body dressing      What is the patient wearing?: Underwear/pull  up, Pants     Lower body assist Assist for lower body dressing: Contact Guard/Touching assist     Toileting Toileting Toileting Activity did not occur (Clothing management and hygiene only): N/A (no void or bm)  Toileting assist Assist for toileting: Supervision/Verbal cueing     Transfers Chair/bed transfer  Transfers assist  Chair/bed transfer activity did not occur: Safety/medical concerns  Chair/bed transfer assist level: Supervision/Verbal cueing Chair/bed transfer assistive device: Walker, Archivist   Ambulation assist   Ambulation activity did not occur: Safety/medical concerns  Assist level: Contact Guard/Touching assist Assistive  device: Walker-rolling Max distance: 300'   Walk 10 feet activity   Assist  Walk 10 feet activity did not occur: Safety/medical concerns  Assist level: Contact Guard/Touching assist Assistive device: Walker-rolling   Walk 50 feet activity   Assist Walk 50 feet with 2 turns activity did not occur: Safety/medical concerns  Assist level: Contact Guard/Touching assist Assistive device: Walker-rolling    Walk 150 feet activity   Assist Walk 150 feet activity did not occur: Safety/medical concerns  Assist level: Contact Guard/Touching assist Assistive device: Walker-rolling    Walk 10 feet on uneven surface  activity   Assist Walk 10 feet on uneven surfaces activity did not occur: Safety/medical concerns   Assist level: Minimal Assistance - Patient > 75% Assistive device: Walker-rolling   Wheelchair     Assist Is the patient using a wheelchair?: No Type of Wheelchair: Manual    Wheelchair assist level: Moderate Assistance - Patient 50 - 74% Max wheelchair distance: 27ft    Wheelchair 50 feet with 2 turns activity    Assist    Wheelchair 50 feet with 2 turns activity did not occur: Safety/medical concerns   Assist Level: Supervision/Verbal cueing   Wheelchair 150 feet activity     Assist  Wheelchair 150 feet activity did not occur: Safety/medical concerns   Assist Level: Moderate Assistance - Patient 50 - 74%   Blood pressure 104/68, pulse 75, temperature 98.2 F (36.8 C), temperature source Oral, resp. rate 18, height 5\' 1"  (1.549 m), weight 58 kg, SpO2 96%.  Medical Problem List and Plan: 1. 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 Awaiting approval of disability. Placement pending. Hoping that hospital will provide LOG for placement. Code status changed to full code after obtaining patient consent Continue CIR therapies including PT, OT, and SLP  Family and team conferences held 1/15, discussed her progress, discussed that mom plans to pursue abandonment as she feels unable to care for Ms. Sherard at home, discussed that Ms Dillen is currently MinA and we can do caregiver training with mother next week to show her what assistance she needs Discussed plan for discharge on Friday 06/08/23  2.  Impaired mobility: d/c lovenox since ambulating >150 feet, continue SCDs  3. Diffuse pains: resolved, gabapentin d/ced  4. History of anxiety: discussed with her mom that she feels this was a big risk factor for her accident, propanolol d/ced due to hypotension. Asked SW to place her on neuropsych list, Buspar ordered prn, continue this. Discussed placing outpatient referral for behavioral health f/u, discussed that Buspar has been helpful for  her.  5. Neuropsych/cognition: This patient is not capable of making decisions on her own behalf  - 10/12: Still not capable of medical decision making; BUT oriented and communicating, following directions - MUCH improved - PRN melatonin 3mg    for insomnia  - Buspar 5mg  TID prn, continue  -1/20 patient was restless at night, will try scheduling melatonin for insomnia  6. Skin: eucerin cream BID -Buttock/sacrum--foam dressing, pressure relief, nutrition -stage 3 left heel, continue foam dressing/ pressure relief  7. Fluids/Electrolytes/Nutrition: Routine in and outs with follow-up chemistries  -Eating  well with cueing   Upgraded to regular diet  -Vit D 4000U daily  -06/03/23 ordered labs for tomorrow since pt likely going home this week- stable   8.  Cavitary right lower lobe pneumonia likely aspiration pneumonia/MRSA pneumonia.  Resolved    Latest Ref Rng  & Units 05/30/2023    5:25 AM 05/16/2023    6:28 AM 05/02/2023    5:20 AM  CBC  WBC 4.0 - 10.5 K/uL 5.3  6.2  5.9   Hemoglobin 12.0 - 15.0 g/dL 40.3  47.4  25.9   Hematocrit 36.0 - 46.0 % 41.5  42.1  43.0   Platelets 150 - 400 K/uL 223  300  241      9.  History of drug abuse.  Positive cocaine on urine drug screen.  Provide counseling  10.  AKI/hypovolemia and ATN.  Resolved     Latest Ref Rng & Units 05/30/2023    5:25 AM 05/16/2023    6:28 AM 05/02/2023    5:20 AM  BMP  Glucose 70 - 99 mg/dL 87  87  89   BUN 6 - 20 mg/dL 10  15  14    Creatinine 0.44 - 1.00 mg/dL 5.63  8.75  6.43   Sodium 135 - 145 mmol/L 137  136  138   Potassium 3.5 - 5.1 mmol/L 3.9  3.9  4.0   Chloride 98 - 111 mmol/L 107  105  109   CO2 22 - 32 mmol/L 25  22  23    Calcium 8.9 - 10.3 mg/dL 8.6  8.9  8.9    -BUN/creatinine stable 1/20  -Potassium 3.91/20  11.  Mild transaminitis with rhabdomyolysis.  Both resolved  12.  Hypotension:  D/c flomax, continue to monitor BP TID  Vitals:   05/31/23 2001 06/01/23 0418 06/01/23 0420 06/01/23 1306  BP: 105/66 (!) 89/58 (!) 102/44 109/74   06/01/23 1939 06/02/23 0301 06/02/23 1302 06/02/23 1919  BP: 100/76 104/64 116/84 99/64   06/03/23 0543 06/03/23 1400 06/03/23 1913 06/04/23 0424  BP: 103/70 118/75 100/69 104/68     13. HTN: resolved  -1/20 BP well-controlled and stable 14. Impaired initiation: resolved, d/c amantadine  -Consider restart amantadine.  15. Hyperglycemia: CBGs mildly elevated, d/ced checks. Prosource d/ced as per patient's request  16. Spasticity:  continue wrist, elbow brace, and PRAFOs. Ordered a night splint for LLE  D/c baclofen and tizanidine- much improved  17.  Dyskinetic movements in head and neck -  -  resolved   18. Cervical extensor weakness: improved, continue strengthening exercises  19. Bowel and bladder incontinence: continue bowel and bladder program. Flomax started for urgency-- d/c'd  20. Fatigue: resolved, d/c b  complex   21. MRSA nares positive: negative on repeat, precautions d/ced  22. Tachycardic: resolved  23. S/p fall: CT head reviewed and is stable  24. Bilateral foot drop: bilateral AFOs ordered, discussed that these can help her to walk  better, continue  25. Vaginal itching: resolved  26. Dysuria resolved  27. Poor sitting balance: continue OT/PT, improving  28. Headaches: topamax 32m daily prn ordered, will d/c since not requiring  29. Drowsiness with tramadol: d/ced  30. IUD in place: Pt states she has IUD x34yrs  and believes it's supposed to be removed soon(chart review- Dr. Seymour Bars placed Mirena IUD on 07/01/19, good for 45yrs); needs outpatient follow-up with OB/GYN  31. Constipation: d/c baclofen, last BM 12/18, milk of magnesia ordered 12/20, resolved, magnesium supplement d/ced  -1/20 last bowel movement was yesterday, continue to monitor  32. History of drug addiction: tramadol d/ced as per mom's preference  33. Depression: discussed scheduling for outpatient psychology/psychiatry follow-up, discussed neuropsych f/u while in hospital  -appreciate Dr. Kieth Brightly follow up  34. Sinusitis: claritin ordered prn, will d/c since not requiring  35. Pruritus: sarna lotion ordered, discussed that this is prn, will d/c since not requiring    LOS: 198 days A FACE TO FACE EVALUATION WAS PERFORMED  Fanny Dance 06/04/2023, 12:49 PM

## 2023-06-04 NOTE — Progress Notes (Signed)
Family IT trainer of Inpatient Rehabilitation, [place], [place], OT/L, and Dossie Der, MSW, met with Nicole [name], Lawson Fiscal Leis's mother.  [name] is scheduled to take patient home on 06/09/23.  [name] states she is refusing to do so.  [name] is refusing to participate in family education sessions scheduled this week.  [name] states she will continue to work with case worker to secure long term disability benefits for patient.  At this point she is leaving it up to [facility] to find a placement that is appropriate for [facility]. Shirlean Mylar, MHA, OT/L [phone]

## 2023-06-05 NOTE — Progress Notes (Signed)
Speech Language Pathology Daily Session Note  Patient Details  Name: Nicole [name] MRN: [id number] Date of Birth: Mar 06, 1976  Today's Date: 06/05/2023 SLP Individual Time: 1001-1032 SLP Individual Time Calculation (min): 31 min  Short Term Goals: Week 9: SLP Short Term Goal 1 (Week 9): Week 28 - Pt will solve mildly complex environmental problems with 70% accuracy given min assist. SLP Short Term Goal 2 (Week 9): Week 28 - Pt will recall basic daily information using external aids with 95% accuracy given supervision assist. SLP Short Term Goal 3 (Week 9): Week 28 - Pt will utilize recommended handwriting strategies to achieve 100% legebility given supervision verbal cues in 4/5 opportunities at the phrase and sentence levels. SLP Short Term Goal 4 (Week 9): Week 28 - Pt will utilize increased vocal intensity w/ supervision verbal cues to remain 95% intelligibile at conversation level in noisy environments in 4/5 opportunities.  Skilled Therapeutic Interventions:  Patient was seen in am to address cognitive re- training. Pt was alert and seated upright in WC upon SLP arrival. Pt reported preference on completing session in atrium due to feeling "confined" in her room. Pt transported to atrium for completion of session. SLP addressed problem solving through challenging pt in navigating web pages. Per pt preference she chose to search through housing and aid assistance. Pt located information on website with 75% acc and mod A. She was further challenged to interpret information found which she completed with 90% acc. At conclusion of session pt was transported back to her room where she was left with call button within reach and chair alarm active. SLP to continue POC.  Pain Pain Assessment Pain Scale: 0-10 Pain Score: 0-No pain  Therapy/Group: Individual Therapy  [name] 06/05/2023, 11:51 AM

## 2023-06-05 NOTE — Progress Notes (Signed)
Physical Therapy Session Note  Patient Details  Name: Nicole [name] MRN: [id number] Date of Birth: 07-Jun-1975  Today's Date: 06/05/2023 PT Individual Time: 0900-0958 + 1300-1400 PT Individual Time Calculation (min): 58 min   + 60 min  Short Term Goals: Week 1:  PT Short Term Goal 1 (Week 1): Pt will perform supine<>sit with supervision PT Short Term Goal 1 - Progress (Week 1): Other (comment) (new goal) PT Short Term Goal 2 (Week 1): Pt will ambulate at least 149ft with no more than min A, not using an AD. PT Short Term Goal 2 - Progress (Week 1): Other (comment) (new goal) PT Short Term Goal 3 (Week 1): Pt will demonstrate decreased fall risk as noted by an improvement of at least 7 points on the [employer] Test PT Short Term Goal 3 - Progress (Week 1): Other (comment) (new goal) PT Short Term Goal 4 (Week 1): Pt will navigate 4 steps using HRs with no more than CGA  Skilled Therapeutic Interventions/Progress Updates:      1st session: Pt sitting in wheelchair to start - has no c/o pain and in agreement to therapy.  Propels herself at supervision level in K0005 wheelchair 154ft from her room to day room rehab gym. Limited shoulder extension bilaterally limited stroke efficiency and propulsion power. Hands dragging on rim's causing veering and slowing.   Completed Wheelchair Evaluation with ATP from [employer]. Patient impairments and deficits discussed for collaborating needs. Decided on: -K0005 Motion Composite Helio A6 wheelchair -Tension adjustable back -[employer] cushion -Desk length flip back arm rests -Spoked rims with flat free, pneumatic inserts -anti-tippers -std hip belt -angle adjustable leg rests -wheel lock extensions  Pt completed dynamic gait without AD or UE support via obstacle course - ambulating on unlevel surface (soft mat), stepping over 6" hurdles, and reciprocal stepping in agility ladder. MinA needed for stability and balance. Added 6# for weighted carries  (holding in RUE) while navigating the same obstacle course and min guard for safety.   Returned to her room and patient left sitting upright in wheelchair - chair pad alarm on, all her needs met.  2nd session: Pt sitting in wheelchair on arrival - has no pain and in agreement to therapy treatment. Pt requests to get a coffee downstairs at [employer].  Sit<>Stand to RW with supervision. Ambulates with supervision and RW from her room to [facility] stairwell, ~236ft. Pt able to descend x4 flights of stairs from 4C to 1st floor using 1 hand rail on her R and PT providing minA on her L. Patient with reciprocal vs step-to pattern - no knee buckling but overcrowds her feet and mild instability. Seated rest break in Atrium lobby.   Pt able to navigate [employer] environment with CGA and RW - min safety cues for managing throw mat's around drink station and cashier. Pt able to vocalize and verbalize her request to the cashier with adequate intelligibility. Pt needing min safety cues for filling her cup with hot coffee and adding the condiments safely to avoid spill and burn. PT added ice to drink for safety precautions to cool off drink. Pt unable to carry coffee and manage RW safely - discussed AE options such as tray or universal cup holders.   Pt ascending x4 flights of stairs back up to CIR floor with minA using 1 hand rail on R. Pt completing with a self selected reciprocal stepping - min cues for safety awareness to ensure full foot on step. Min cues for  energy conservation with x2 brief standing rest breaks to avoid over fatigue.   In ADL apartment upstairs, completed reaching above head in cabinets, opening dishwasher, and opening pantry door with CGA and RW. Practiced furniture transfers using Teaching laboratory technician. Suprisingly, patient experiencing difficulty standing from this surface - apraxia limiting due to novel surface and patient intimidated by rocking/gliding of the recliner. Patient trying to pull  nightstand closer to the recliner to have stable surface to push from. Educated on safety precautions and motor planning to accomplish task - able to complete with light minA for initiation.   Returned to her room and patient left sitting upright in wheelchair with chair pad alarm on and all her needs met.   Therapy Documentation Precautions:  Precautions Precautions: Fall Precaution Comments: decreased stand balance- reliant on UE, Pain in LLE limits mobility at times Required Braces or Orthoses: Other Brace Other Brace: B adjustable night splints; B elbow "cosey" splints; L palm protector; R mitt Restrictions Weight Bearing Restrictions Per Provider Order: No General:     Therapy/Group: Individual Therapy  Laila Myhre P Mishael Haran  PT, DPT, CSRS  06/05/2023, 7:51 AM

## 2023-06-05 NOTE — Progress Notes (Signed)
Physical Therapy Session Note  Patient Details  Name: Nicole [name] MRN: [id number] Date of Birth: 1976-01-03  Today's Date: 06/05/2023 PT Individual Time:  -      Short Term Goals: Week 1:  PT Short Term Goal 1 (Week 5): Pt will initiate bed mobility requiring no more than totalA. PT Short Term Goal 1 - Progress (Week 5): Progressing toward goal PT Short Term Goal 2 (Week 5): Pt will maintain sitting balance unsupported for up to 30 seconds PT Short Term Goal 2 - Progress (Week 5): Met PT Short Term Goal 3 (Week 5): Pt will demonstrate command follow for >10% of tasks. PT Short Term Goal 3 - Progress (Week 5): Progressing toward goal PT Short Term Goal 4 (Week 5): [name] RLE will demonstrate improved joint mobility into knee/ hip extension and ankle DF to neutral. PT Short Term Goal 4 - Progress (Week 5): Progressing toward goal  Skilled Therapeutic Interventions/Progress Updates:  Patient seated upright in w/c with toothbrush and basin in hand on entrance to room. Patient alert and agreeable to PT session. Requests to brush teeth.   Patient with no pain complaint at start of session.  Pt propels w/c to sink with constant repositioning and readjusting of w/c d/t imbalanced UE push on wheels. Requires minA for proper positioning at sink. Provided with questioning cues re: steps for brushing teeth as pt puts toothbrush in mouth but only brushes 2-3 strokes prior to bringing toothbrush out of mouth. Pt states that she does not like mint taste. Continued to provide cues to continue brushing  for specific areas of mouth in order to complete in less time.   Pt then ambulates from room with no AD but demos increased forward lean with increased effort to advance RLE. After 100 ft pt loses balance to L and forward and requires MaxA to maintain balance. Then provided with RW to complete distance through day room and then to mat table in main gym - around 175 ft.   NMR training for weight shifting,  standing balance, coordination with pt guided in toe touches to color discs on floor in arc pattern around anterior of pt. Pt is able to adequately maintain balance throughout with lateral step outs to minimal cross over stepping. Pt is able to maintain throughout x4.   NMR performed for improvements in motor control and coordination, balance, sequencing, judgement, and self confidence/ efficacy in performing all aspects of mobility at highest level of independence.   Pt requests to go outside despite cold and relates that she likes the cold weather. Retrieved coat for pt. Taken outside of [facility] entrance via w/c for time. Rec therapist present and assists with ambulation and encouragement for pt to improve quality of gait and focus.   She is able to ambulate over uneven surfaces and provided with vc for maintaining RW position with hands in front of hips in order to smooth out the walker progression as pt tending to pick up RW consistently following 2 steps and to maintain straight path d/t uneven UE push. Requires Rec therapist to assist with RW positioning to demonstrate initially and then able to return demo.   Pt able to ambulate over uneven surface outside, over threshold and floor mats, then across atrium area in hospital. While ambulating, pt relates need to urinate. Adjusted path and taken to public bathroom where pt is able to continently void within a brief period of sitting to toilet. Supervision for pericare and MinA for clothing mgmt.  Patient supine in bed at end of session with brakes locked, bed alarm set, and all needs within reach.   Therapy Documentation Precautions:  Precautions Precautions: Fall Precaution Comments: decreased stand balance- reliant on UE, Pain in LLE limits mobility at times Required Braces or Orthoses: Other Brace Other Brace: B adjustable night splints; B elbow "cosey" splints; L palm protector; R mitt Restrictions Weight Bearing Restrictions Per Provider  Order: No  Pain:  No pain related this session.   Therapy/Group: Individual Therapy  Loel Dubonnet PT, DPT, CSRS 06/05/2023, 6:06 AM

## 2023-06-05 NOTE — [place] FL2 (Signed)
[place] MEDICAID FL2 LEVEL OF CARE FORM     IDENTIFICATION  Patient Name: Nicole [name] Birthdate: 1975-09-22 Sex: female Admission Date (Current Location): 11/18/2022  [place] and [place] Number:  Haynes Bast [id number] S Facility and Address:  [facility] [place], [address], [place], [place] [address]      Provider Number: [id number]  Attending Physician Name and Address:  Horton Chin, MD  Relative Name and Phone Number:  Quincy Carnes [phone]    Current Level of Care: Other (Comment) (rehab) Recommended Level of Care: Assisted Living Facility Prior Approval Number:    Date Approved/Denied:   PASRR Number: [id number]  Discharge Plan: Domiciliary (Rest home)    Current Diagnoses: Patient Active Problem List   Diagnosis Date Noted   History of cocaine abuse (HCC) 05/17/2023   Anxiety with depression 05/17/2023   Essential hypertension 01/29/2023   Pressure injury of skin 01/29/2023   Protein-calorie malnutrition, severe 01/05/2023   Hypoxic brain injury (HCC) 11/18/2022   Cavitary pneumonia 11/05/2022   Substance use 11/05/2022   Acute respiratory failure (HCC) 10/31/2022   Nonallergic rhinitis 08/23/2020   Mild persistent asthma without complication 06/14/2020   Other allergic rhinitis 06/14/2020   Adverse reaction to food, subsequent encounter 06/14/2020   IBS (irritable bowel syndrome) 05/24/2016   Chronic venous insufficiency 05/24/2016   Migraine 05/24/2016   Pulmonary nodules 10/27/2014   Hepatitis C, chronic (HCC)    Depression 10/09/2013   OSA on CPAP - sees Dr. Jenne Campus in ENT 10/09/2013   Allergic rhinitis 05/30/2012   Cough variant asthma 02/08/2012    Orientation RESPIRATION BLADDER Height & Weight     Time, Place  Normal Incontinent Weight: 127 lb 13.9 oz (58 kg) Height:  5\' 1"  (154.9 cm)  BEHAVIORAL SYMPTOMS/MOOD NEUROLOGICAL BOWEL NUTRITION STATUS      Continent Diet (regular thin liquids)   AMBULATORY STATUS COMMUNICATION OF NEEDS Skin   Limited Assist Verbally Normal, Other (Comment) (skin is healed) PU Stage 1 Dressing: Daily PU Stage 2 Dressing: Daily                   Personal Care Assistance Level of Assistance  Bathing, Dressing, Feeding Bathing Assistance: Limited assistance Feeding assistance: Limited assistance Dressing Assistance: Limited assistance Total Care Assistance: Limited assistance   Functional Limitations Info  Sight, Speech Sight Info: Impaired Hearing Info: Adequate Speech Info: Impaired    SPECIAL CARE FACTORS FREQUENCY  Bowel and bladder program     PT Frequency: 2-3 x week OT Frequency: 2-3 x week Bowel and Bladder Program Frequency: timed tolieting 3 hours   Speech Therapy Frequency: 2-3 x week      Contractures Contractures Info: Not present    Additional Factors Info  Code Status, Allergies Code Status Info: Full Code Allergies Info: [place] nuts, cashews, ceclor and nickel     Isolation Precautions Info: Off MRSA precautions and skin is healed due to long length of stay     Current Medications (06/05/2023):  This is the current hospital active medication list Current Facility-Administered Medications  Medication Dose Route Frequency Provider Last Rate Last Admin   acetaminophen (TYLENOL) tablet 650 mg  650 mg Oral Q6H PRN Charlton Amor, PA-C   650 mg at 06/03/23 0732   busPIRone (BUSPAR) tablet 5 mg  5 mg Oral TID PRN Horton Chin, MD   5 mg at 05/27/23 2115   cholecalciferol (VITAMIN D3) 25 MCG (1000 UNIT) tablet 4,000 Units  4,000 Units Oral  Daily Raulkar, Drema Pry, MD   4,000 Units at 06/05/23 1610   hydrocerin (EUCERIN) cream   Topical BID Horton Chin, MD   Given at 06/05/23 9604   melatonin tablet 3 mg  3 mg Oral QHS Fanny Dance, MD   3 mg at 06/04/23 2012   selenium sulfide (SELSUN) 1 % shampoo   Topical PRN Horton Chin, MD   Given at 01/17/23 2056     Discharge  Medications: Please see discharge summary for a list of discharge medications.  Relevant Imaging Results:  Relevant Lab Results:   Additional Information SSN: [id number]  Othell Diluzio, Lemar Livings, LCSW

## 2023-06-05 NOTE — Progress Notes (Signed)
Occupational Therapy Session Note  Patient Details  Name: Nicole [name] MRN: [id number] Date of Birth: Oct 19, 1975  Today's Date: 06/05/2023 OT Individual Time: 1100-1200 OT Individual Time Calculation (min): 60 min    Short Term Goals: Week 7:  OT Short Term Goal 1 (Week 7): Patient will complete hair care including putting hair into ponytail with intermittent min asssit OT Short Term Goal 1 - Progress (Week 7): Other (comment) OT Short Term Goal 2 (Week 7): Patient will prepare simple snack preparation with CGA OT Short Term Goal 2 - Progress (Week 7): Other (comment) OT Short Term Goal 3 (Week 7): Patient will complete laundry task with contact guard assist OT Short Term Goal 3 - Progress (Week 7): Other (comment) OT Short Term Goal 4 (Week 7): xxx OT Short Term Goal 4 - Progress (Week 7): Other (comment) OT Short Term Goal 5 (Week 7): xxx OT Short Term Goal 5 - Progress (Week 7): Other (comment)  Skilled Therapeutic Interventions/Progress Updates:    1:1 Pt was in bathroom toileting when arrived with nursing. Pt ambulated with supervision with RW to the sink to wash hands. Pt then ambulated without AD to the dayroom with focus on maintaining upright posture and sustaining her head up (instead of flexed).  On the mat focus on neck extension to neutral and slightly into extension for stretch in supine; as well as rotating her neck. Pt does present with neck tightness and does present with decr neck extension. In mat focused on transitional movements- in and out of different positioning (including prone, supine, quadriped, child's pose). Attempted to do bird dog but difficulty with task. In supine, holding bolster; pt lifted above head and then took down to her lap for UB strengthening. Also perform sit ups in modified fashion. Pt transitioned to sitting on a therapy ball to work on pelvic mobility and trunk control on dynamic surface.  Pt ambulated back to her room with min A without AD and left  in prep for lunch.   Therapy Documentation Precautions:  Precautions Precautions: Fall Precaution Comments: decreased stand balance- reliant on UE, Pain in LLE limits mobility at times Required Braces or Orthoses: Other Brace Other Brace: B adjustable night splints; B elbow "cosey" splints; L palm protector; R mitt Restrictions Weight Bearing Restrictions Per Provider Order: No  Pain: Pain Assessment Pain Scale: 0-10 Pain Score: 0-No pain   Therapy/Group: Individual Therapy  Roney Mans [facility] 06/05/2023, 2:13 PM

## 2023-06-05 NOTE — Progress Notes (Addendum)
Patient ID: Nicole [name], female   DOB: November 22, 1975, 48 y.o.   MRN: [id number]  Have reached out to [facility] and [facility] [place] to see if would consider pt and our LOG. Left messages for both await call back  2:37 PM Three way call with Mom, myself and SSD-case worker who reports she has no records from [facility] although have faxed record three times since last Thursday. Have another fax number [phone] to try this one. Have faxed pt's records once again.

## 2023-06-05 NOTE — Progress Notes (Addendum)
PROGRESS NOTE   Subjective/Complaints:  No new issues today. Mom is refusing to take Mead home.    ROS: Patient denies fever, rash, sore throat, blurred vision, dizziness, nausea, vomiting, diarrhea, cough, shortness of breath or chest pain, joint or back/neck pain, headache, or mood change.     Objective:   No results found.   No results for input(s): "WBC", "HGB", "HCT", "PLT" in the last 72 hours.          No results for input(s): "NA", "K", "CL", "CO2", "GLUCOSE", "BUN", "CREATININE", "CALCIUM" in the last 72 hours.           Intake/Output Summary (Last 24 hours) at 06/05/2023 0935 Last data filed at 06/05/2023 0811 Gross per 24 hour  Intake 711 ml  Output --  Net 711 ml           Physical Exam: Vital Signs Blood pressure 101/73, pulse 67, temperature 97.6 F (36.4 C), resp. rate 17, height 5\' 1"  (1.549 m), weight 58 kg, SpO2 98%.  Constitutional: No distress . Vital signs reviewed. HEENT: NCAT, EOMI, oral membranes moist Neck: supple Cardiovascular: RRR without murmur. No JVD    Respiratory/Chest: CTA Bilaterally without wheezes or rales. Normal effort    GI/Abdomen: BS +, non-tender, non-distended Ext: no clubbing, cyanosis, or edema Psych: generally pleasant and cooperative  Neuro: Awake and alert, follows commands  Skin: + Scaling dried skin on bilateral heels Neurologic: alert, oriented to person, place. Fair insight Phonation and speech intelligability much improved. Fair insight. MAS 1 right elbow,MAS  tr to 1 in RIght finger and wrist flexors  MAS 1 in extensors of LE's MAS 1 L elbow , MAS 1+ in L wrist and finger flexors. Hypersensitivity to left hand improved, Left sided strength improved, decreased range of motion and pain at end range of motion in left shoulder,--motor/tone stable  Musculoskeletal:some limitations in right shoulder ROM     Assessment/Plan: 1. Functional  deficits which require 3+ hours per day of interdisciplinary therapy in a comprehensive inpatient rehab setting. Physiatrist is providing close team supervision and 24 hour management of active medical problems listed below. Physiatrist and rehab team continue to assess barriers to discharge/monitor patient progress toward functional and medical goals  Care Tool:  Bathing    Body parts bathed by patient: Face, Abdomen, Chest, Left upper leg, Right upper leg, Left arm, Front perineal area, Right arm, Right lower leg, Left lower leg, Buttocks   Body parts bathed by helper: Buttocks Body parts n/a: Right arm, Left arm, Front perineal area, Buttocks, Right upper leg, Left upper leg, Right lower leg, Left lower leg, Chest, Abdomen, Face   Bathing assist Assist Level: Supervision/Verbal cueing     Upper Body Dressing/Undressing Upper body dressing   What is the patient wearing?: Pull over shirt    Upper body assist Assist Level: Set up assist    Lower Body Dressing/Undressing Lower body dressing      What is the patient wearing?: Underwear/pull up, Pants     Lower body assist Assist for lower body dressing: Contact Guard/Touching assist     Toileting Toileting Toileting Activity did not occur (Clothing management and hygiene only): N/A (  no void or bm)  Toileting assist Assist for toileting: Supervision/Verbal cueing     Transfers Chair/bed transfer  Transfers assist  Chair/bed transfer activity did not occur: Safety/medical concerns  Chair/bed transfer assist level: Supervision/Verbal cueing Chair/bed transfer assistive device: Walker, Armrests   Locomotion Ambulation   Ambulation assist   Ambulation activity did not occur: Safety/medical concerns  Assist level: Contact Guard/Touching assist Assistive device: Walker-rolling Max distance: 300'   Walk 10 feet activity   Assist  Walk 10 feet activity did not occur: Safety/medical concerns  Assist level: Contact  Guard/Touching assist Assistive device: Walker-rolling   Walk 50 feet activity   Assist Walk 50 feet with 2 turns activity did not occur: Safety/medical concerns  Assist level: Contact Guard/Touching assist Assistive device: Walker-rolling    Walk 150 feet activity   Assist Walk 150 feet activity did not occur: Safety/medical concerns  Assist level: Contact Guard/Touching assist Assistive device: Walker-rolling    Walk 10 feet on uneven surface  activity   Assist Walk 10 feet on uneven surfaces activity did not occur: Safety/medical concerns   Assist level: Minimal Assistance - Patient > 75% Assistive device: Walker-rolling   Wheelchair     Assist Is the patient using a wheelchair?: No Type of Wheelchair: Manual    Wheelchair assist level: Moderate Assistance - Patient 50 - 74% Max wheelchair distance: 82ft    Wheelchair 50 feet with 2 turns activity    Assist    Wheelchair 50 feet with 2 turns activity did not occur: Safety/medical concerns   Assist Level: Supervision/Verbal cueing   Wheelchair 150 feet activity     Assist  Wheelchair 150 feet activity did not occur: Safety/medical concerns   Assist Level: Moderate Assistance - Patient 50 - 74%   Blood pressure 101/73, pulse 67, temperature 97.6 F (36.4 C), resp. rate 17, height 5\' 1"  (1.549 m), weight 58 kg, SpO2 98%.  Medical Problem List and Plan: 1. 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 Awaiting approval of disability. Placement pending. Hoping that hospital will provide LOG for placement. Code status changed to full code after obtaining patient  consent Mother refusing to bring pt home. Planned dc date was 1/25. Will review options with team, admin  2.  Impaired mobility: d/c lovenox since ambulating >150 feet, continue SCDs  3. Diffuse pains: resolved, gabapentin d/ced  4. History of anxiety: discussed with her mom that she feels this was a big risk factor for her accident, propanolol d/ced due to hypotension. Asked SW to place her on neuropsych list, Buspar ordered prn, continue this. Discussed placing outpatient referral for behavioral health f/u, discussed that Buspar has been helpful for her.  5. Neuropsych/cognition: This patient is not capable of making decisions on her own behalf  - 10/12: Still not capable of medical decision making; BUT oriented and communicating, following directions - MUCH improved - PRN melatonin 3mg    for insomnia  - Buspar 5mg  TID prn, continue  -1/21 continue melatonin for sleep  6. Skin: eucerin cream BID -Buttock/sacrum--foam dressing, pressure relief, nutrition -stage 3 left heel, continue foam dressing/ pressure relief  7. Fluids/Electrolytes/Nutrition: Routine in and outs with follow-up chemistries  -  Eating  well with cueing   Upgraded to regular diet  -Vit D 4000U daily  -06/03/23 ordered labs for tomorrow since pt likely going home this week- stable   8.  Cavitary right lower lobe pneumonia likely aspiration pneumonia/MRSA pneumonia.  Resolved    Latest Ref Rng & Units 05/30/2023    5:25 AM 05/16/2023    6:28 AM 05/02/2023    5:20 AM  CBC  WBC 4.0 - 10.5 K/uL 5.3  6.2  5.9   Hemoglobin 12.0 - 15.0 g/dL 45.4  09.8  11.9   Hematocrit 36.0 - 46.0 % 41.5  42.1  43.0   Platelets 150 - 400 K/uL 223  300  241      9.  History of drug abuse.  Positive cocaine on urine drug screen.  Provide counseling  10.  AKI/hypovolemia and ATN.  Resolved     Latest Ref Rng & Units 05/30/2023    5:25 AM 05/16/2023    6:28 AM 05/02/2023    5:20 AM  BMP  Glucose 70 - 99 mg/dL 87  87  89   BUN 6 - 20  mg/dL 10  15  14    Creatinine 0.44 - 1.00 mg/dL 1.47  8.29  5.62   Sodium 135 - 145 mmol/L 137  136  138   Potassium 3.5 - 5.1 mmol/L 3.9  3.9  4.0   Chloride 98 - 111 mmol/L 107  105  109   CO2 22 - 32 mmol/L 25  22  23    Calcium 8.9 - 10.3 mg/dL 8.6  8.9  8.9    -BUN/creatinine stable 1/20  -Potassium 3.91/20  11.  Mild transaminitis with rhabdomyolysis.  Both resolved  12.  Hypotension:  D/c flomax, continue to monitor BP TID  Vitals:   06/01/23 1306 06/01/23 1939 06/02/23 0301 06/02/23 1302  BP: 109/74 100/76 104/64 116/84   06/02/23 1919 06/03/23 0543 06/03/23 1400 06/03/23 1913  BP: 99/64 103/70 118/75 100/69   06/04/23 0424 06/04/23 1317 06/04/23 2009 06/05/23 0459  BP: 104/68 101/74 103/69 101/73     13. HTN: resolved  -1/20 BP well-controlled and stable 14. Impaired initiation: resolved, d/c amantadine  -Consider restart amantadine.  15. Hyperglycemia: CBGs mildly elevated, d/ced checks. Prosource d/ced as per patient's request  16. Spasticity:  continue wrist, elbow brace, and PRAFOs. Ordered a night splint for LLE  off baclofen and tizanidine- much improved  17.  Dyskinetic movements in head and neck -  -  resolved   18. Cervical extensor weakness: improved, continue strengthening exercises  19. Bowel and bladder incontinence: continue bowel and bladder program. Flomax started for urgency-- d/c'd  20. Fatigue: resolved, d/c b complex   21. MRSA nares positive: negative on repeat, precautions d/ced  22. Tachycardic: resolved  23. S/p fall: CT head reviewed and is stable  24. Bilateral foot drop: bilateral AFOs ordered, discussed that these can help her to walk better, continue  25. Vaginal itching: resolved  26. Dysuria resolved  27. Poor sitting balance: continue OT/PT, improving  28. Headaches: topamax 67m daily prn ordered, will d/c since not requiring  29. Drowsiness with tramadol: d/ced  30. IUD in place: Pt states she has IUD x22yrs  and  believes it's supposed to be removed soon(chart review- Dr. Seymour Bars placed Mirena IUD on 07/01/19, good for 62yrs); needs outpatient follow-up with OB/GYN  31. Constipation: d/c baclofen, last BM 12/18, milk of magnesia ordered 12/20, resolved, magnesium supplement d/ced  -lbm 1/19 32. History  of drug addiction: tramadol d/ced as per mom's preference  33. Depression: discussed scheduling for outpatient psychology/psychiatry follow-up, discussed neuropsych f/u while in hospital  -appreciate Dr. Kieth Brightly follow up  34. Sinusitis: claritin ordered prn, will d/c since not requiring  35. Pruritus: sarna lotion ordered, discussed that this is prn, will d/c since not requiring    LOS: 199 days A FACE TO FACE EVALUATION WAS PERFORMED  Ranelle Oyster 06/05/2023, 9:35 AM

## 2023-06-06 NOTE — Progress Notes (Signed)
Physical Therapy Session Note  Patient Details  Name: Nicole [name] MRN: [id number] Date of Birth: December 16, 1975  Today's Date: 06/06/2023 PT Individual Time: 0800-0915 + 1125-1130 PT Individual Time Calculation (min): 75 min  + 25 min  Short Term Goals: Week 1:  PT Short Term Goal 1 (Week 1): Pt will perform supine<>sit with supervision PT Short Term Goal 1 - Progress (Week 1): Other (comment) (new goal) PT Short Term Goal 2 (Week 1): Pt will ambulate at least 122ft with no more than min A, not using an AD. PT Short Term Goal 2 - Progress (Week 1): Other (comment) (new goal) PT Short Term Goal 3 (Week 1): Pt will demonstrate decreased fall risk as noted by an improvement of at least 7 points on the [employer] Test PT Short Term Goal 3 - Progress (Week 1): Other (comment) (new goal) PT Short Term Goal 4 (Week 1): Pt will navigate 4 steps using HRs with no more than CGA  Skilled Therapeutic Interventions/Progress Updates:      1st session: Pt in bed to start -has complaints of lower back pain, unrated. Donned pants while sitting EOB with minA for threading.   Sit<>Stand with no AD and ambulates sinkside to complete oral care. Timed patient to encourage attention and focus while brushing her teeth. Patient needing > 4.5 minutes to complete. She has difficulty initiating and then even greater difficulty sustaining tooth brushing. She says she has difficulty due to internal and external distractions, requested the TV be turned off to limit distractions but continued to have difficulty.  Gait training with no AD at CGA/minA level ~264ft to ortho rehab gym. Cues for safety awareness, pacing herself, and stabilizing core to limit instability.  Mat table stretching for lower back - B knees to chest, trunk twists, windshield wipers, etc. Added moist heat packs for lower back as well. Also completed seated lower back stretching, reaching for toes with CGA for safety. Pt reports improvement in lower  back discomfort and less rigid/stiff.  Dynamic gait while holding platter with drinks (x2, 1/2 filled) - ambulated 178ft + 177ft with minA - pt spilling the drinks x2 times. Pt with difficulty dual-tasking, especially as she's fatigued or distracted.  Returned to her room and patient left sitting in wheelchair with chair pad alarm on.    2nd session: Pt sitting in her wheelchair and agreeable to PT tx. Continues to report lower back pain and L hip pain. Rest breaks and mobility provided for management.   Sit<>stand with CGA and no AD - ambulates with minA and no AD ~145ft to main rehab gym. Increased unsteadiness with turns, patient quick to move and has delayed stepping strategies for balance recovery while ambulating.   In // bars, worked on [employer] for standing balance - standing on bosu ball with minA for balance - instructed to place hands in pockets to prevent BUE support on // bar - VC for upright posture and using mirror for visual aid.   Continued NMR with "stir-the-pots" in modified plantigrade against wall and then on hands/knees on mat table. MinA for positioning and setup. She complains of mild L wrist discomfort with prolonged weightbearing - decreased wrist extension with joint tightness.   Ambulated back to her room at minA level with no AD - left sitting upright with all her needs met, chair pad alarm on.     Therapy Documentation Precautions:  Precautions Precautions: Fall Precaution Comments: decreased stand balance- reliant on UE, Pain in LLE limits  mobility at times Required Braces or Orthoses: Other Brace Other Brace: B adjustable night splints; B elbow "cosey" splints; L palm protector; R mitt Restrictions Weight Bearing Restrictions Per Provider Order: No General:     Therapy/Group: Individual Therapy  Upton Russey P Georgianna Band  PT, DPT, CSRS  06/06/2023, 7:56 AM

## 2023-06-06 NOTE — Progress Notes (Signed)
Occupational Therapy Session Note  Patient Details  Name: SAHVANNA AMLING MRN: [id number] Date of Birth: 1976-02-14  Today's Date: 06/06/2023 OT Individual Time: 1315-1400 OT Individual Time Calculation (min): 45 min    Short Term Goals: Week 7:  OT Short Term Goal 1 (Week 7): Patient will complete hair care including putting hair into ponytail with intermittent min asssit OT Short Term Goal 1 - Progress (Week 7): Other (comment) OT Short Term Goal 2 (Week 7): Patient will prepare simple snack preparation with CGA OT Short Term Goal 2 - Progress (Week 7): Other (comment) OT Short Term Goal 3 (Week 7): Patient will complete laundry task with contact guard assist OT Short Term Goal 3 - Progress (Week 7): Other (comment) OT Short Term Goal 4 (Week 7): xxx OT Short Term Goal 4 - Progress (Week 7): Other (comment) OT Short Term Goal 5 (Week 7): xxx OT Short Term Goal 5 - Progress (Week 7): Other (comment)  Skilled Therapeutic Interventions/Progress Updates:    Patient received seated in wheelchair - mom present.  Mom reports that Ilene seems unable to brush teeth.  Patient sitting in wheelchair with lunch tray in front of her.  Walked to sink, and patient able to apply toothpaste, and brush teeth with increased time and intermittent cueing.  Patient definitely functions best with environmental context - standing at sink with toothbrush/past in reach.  Walked without walker to gym working on postural control and reducing excessive lateral flexion to advance left foot.  Patient does well with physical facilitation, and can even replicate for a few steps, until she increases her pace.  Worked on bimanual and bilateral coordination skills while walking - holding a ball, tossing and catching a ball.  Worked on furniture transfers - into and out of regular bed with min assist, to couch, recliner with close supervision and cueing.  Mom pleased to see patient walking without her walker.  Left up in chair with  chair pad in place and engaged/ personal items in reach.    Therapy Documentation Precautions:  Precautions Precautions: Fall Precaution Comments: decreased stand balance- reliant on UE, Pain in LLE limits mobility at times Required Braces or Orthoses: Other Brace Other Brace: B adjustable night splints; B elbow "cosey" splints; L palm protector; R mitt Restrictions Weight Bearing Restrictions Per Provider Order: No   Pain:  Denies pain    Therapy/Group: Individual Therapy  [name] 06/06/2023, 3:12 PM

## 2023-06-06 NOTE — Progress Notes (Signed)
Occupational Therapy Session Note  Patient Details  Name: ARLETHA TREVITHICK MRN: [id number] Date of Birth: 05/15/1976  Today's Date: 06/06/2023 OT Individual Time: 1002-1102 OT Individual Time Calculation (min): 60 min    Short Term Goals: Week 7:  OT Short Term Goal 1 (Week 7): Patient will complete hair care including putting hair into ponytail with intermittent min asssit OT Short Term Goal 1 - Progress (Week 7): Other (comment) OT Short Term Goal 2 (Week 7): Patient will prepare simple snack preparation with CGA OT Short Term Goal 2 - Progress (Week 7): Other (comment) OT Short Term Goal 3 (Week 7): Patient will complete laundry task with contact guard assist OT Short Term Goal 3 - Progress (Week 7): Other (comment) OT Short Term Goal 4 (Week 7): xxx OT Short Term Goal 4 - Progress (Week 7): Other (comment) OT Short Term Goal 5 (Week 7): xxx OT Short Term Goal 5 - Progress (Week 7): Other (comment)  Skilled Therapeutic Interventions/Progress Updates:    Patient received sitting in wheelchair, eager for OT session.  Walked to bathroom to attempt to void without success.  Walked to shower seat and needing cueing for self organization for shower despite daily routine.  Patient walked to sink to complete grooming with intermittent cueing.  Needed min assist to undress/ dress lower body. Patient actively reporting difficulty concentrating.  She is aware at some level of needing increased time to complete familiar tasks.  At times, patient asks to be timed - or to set a timed goal to help her maintain focus on aspect of a task.  Athetoid like head movement very prevalent within a task when patient is "stuck."  Less evident when patient is fully engaged.  Will continue to monitor.  Left up in wheelchair with chair pad alarm in place and engaged and personal items in reach.   Therapy Documentation Precautions:  Precautions Precautions: Fall Precaution Comments: decreased stand balance- reliant on  UE, Pain in LLE limits mobility at times Required Braces or Orthoses: Other Brace Other Brace: B adjustable night splints; B elbow "cosey" splints; L palm protector; R mitt Restrictions Weight Bearing Restrictions Per Provider Order: No   Pain:  Denies pain      Therapy/Group: Individual Therapy  [name] 06/06/2023, 1:12 PM

## 2023-06-07 MED ORDER — AMANTADINE HCL 100 MG PO CAPS
100.0000 mg | ORAL_CAPSULE | Freq: Every day | ORAL | Status: DC
  Administered 2023-06-07 – 2023-06-12 (×6): 100 mg via ORAL
  Filled 2023-06-07 (×6): qty 1

## 2023-06-07 NOTE — Progress Notes (Addendum)
Patient ID: Nicole [name], female   DOB: [birth year], 48 y.o.   MRN: [id number] Spoke with [facility] who reports she has received the first half of pt's recommends not the back half with most current levels. Will re-send the back half and check to see if recieved.   2:20 PM Another message left for [facility] at [place]. Will try to go over pts case with her to see if could offer a bed

## 2023-06-07 NOTE — Plan of Care (Signed)
[name] plan of care adjusted to QD after speaking with care team and discussed with MD in team conference with OT, PT, and SLP.

## 2023-06-07 NOTE — Progress Notes (Signed)
Physical Therapy Weekly Progress Note  Patient Details  Name: Nicole [name] MRN: [id number] Date of Birth: 1976-02-11  Beginning of progress report period: May 31, 2022 End of progress report period: June 06, 2022   [name] is working on higher level Therapist, occupational, floor transfer recovery, dynamic gait, and dual-cog NMR during therapy sessions. Have been progressing from RW to no AD but given unsteadiness and mild impulsive behavior, continue to recommend RW for safety. She continues to be primarily limited by cognitive impairments, poor sustained attention, joint tightness, and motor apraxia. Have completed a custom wheelchair evaluation with [employer] for a K0005 ultra lightweight wheelchair - availability of custom chair pending DC disposition. Her most recent functional outcome measures: BERG 35/56, TUG 16s with RW, and 5xSTS 11.5s. With discussion from team conference with MD and other therapy disciplines, plan to Q D patient with therapy due to plateau in functional mobility and her 200 day LOS.    Patient continues to demonstrate the following deficits muscle weakness and muscle joint tightness, decreased cardiorespiratoy endurance, impaired timing and sequencing, abnormal tone, unbalanced muscle activation, motor apraxia, decreased coordination, and decreased motor planning, decreased motor planning, decreased initiation, decreased attention, decreased awareness, decreased problem solving, decreased safety awareness, and decreased memory, and decreased sitting balance, decreased standing balance, decreased postural control, hemiplegia, and decreased balance strategies and therefore will continue to benefit from skilled PT intervention to increase functional independence with mobility.  Patient progressing toward long term goals..  Continue plan of care.  PT Short Term Goals Week 1:  PT Short Term Goal 1 (Week 1): Pt will perform supine<>sit with supervision PT Short Term Goal  1 - Progress (Week 1): Other (comment) (new goal) PT Short Term Goal 2 (Week 1): Pt will ambulate at least 152ft with no more than min A, not using an AD. PT Short Term Goal 2 - Progress (Week 1): Other (comment) (new goal) PT Short Term Goal 3 (Week 1): Pt will demonstrate decreased fall risk as noted by an improvement of at least 7 points on the [employer] Test PT Short Term Goal 3 - Progress (Week 1): Other (comment) (new goal) PT Short Term Goal 4 (Week 1): Pt will navigate 4 steps using HRs with no more than CGA   Therapy Documentation Precautions:  Precautions Precautions: Fall Precaution Comments: decreased stand balance- reliant on UE, Pain in LLE limits mobility at times Required Braces or Orthoses: Other Brace Other Brace: B adjustable night splints; B elbow "cosey" splints; L palm protector; R mitt Restrictions Weight Bearing Restrictions Per Provider Order: No General:     Marquelle Musgrave P Jan Olano  PT, DPT, CSRS  06/07/2023, 7:46 AM

## 2023-06-07 NOTE — Progress Notes (Signed)
Occupational Therapy Session Note  Patient Details  Name: Nicole [name] MRN: [id number] Date of Birth: Jan 21, 1976  Today's Date: 06/07/2023 OT Individual Time: 4098-1191 OT Individual Time Calculation (min): 45 min    Skilled Therapeutic Interventions/Progress Updates: Patient received finishing breakfast sitting up in bed. Patient agreeable to participating with OT for am ADL's. Patient wanting to get in the shower and wash hair. Close SBA to ambulate to the toilet. SPV for toileting. Transferred into the walk in shower using grab bars for balance. Set up for shower seated on shower chair. After drying ambulated to gather clothing from drawers close CGA when reaching to low drawers. Set up for grooming tasks sitting EOB and standing at the sink. Patient needing occasional cuing to initiate task or complete task. Patient aware of memory deficits and how they can impact safety. Continue with skilled OT to work on safety and independence with self care skills. Patient left sitting up with call bell with in reach.      Therapy Documentation Precautions:  Precautions Precautions: Fall Precaution Comments: decreased stand balance- reliant on UE, Pain in LLE limits mobility at times Required Braces or Orthoses: Other Brace Other Brace: B adjustable night splints; B elbow "cosey" splints; L palm protector; R mitt Restrictions Weight Bearing Restrictions Per Provider Order: No    Pain:reports sore LE's from prior exercise.     Therapy/Group: Individual Therapy  Warnell Forester 06/07/2023, 12:09 PM

## 2023-06-07 NOTE — Progress Notes (Signed)
PROGRESS NOTE   Subjective/Complaints:  Pt and team feel that her concentration has lagged since stopping amantadine. Otherwise doing well  ROS: Patient denies fever, rash, sore throat, blurred vision, dizziness, nausea, vomiting, diarrhea, cough, shortness of breath or chest pain,   headache, or mood change.     Objective:   No results found.   No results for input(s): "WBC", "HGB", "HCT", "PLT" in the last 72 hours.          No results for input(s): "NA", "K", "CL", "CO2", "GLUCOSE", "BUN", "CREATININE", "CALCIUM" in the last 72 hours.           Intake/Output Summary (Last 24 hours) at 06/07/2023 0941 Last data filed at 06/07/2023 0808 Gross per 24 hour  Intake 477 ml  Output --  Net 477 ml           Physical Exam: Vital Signs Blood pressure 104/67, pulse 73, temperature 97.7 F (36.5 C), resp. rate 16, height 5\' 1"  (1.549 m), weight 58 kg, SpO2 97%.  Constitutional: No distress . Vital signs reviewed. HEENT: NCAT, EOMI, oral membranes moist Neck: supple Cardiovascular: RRR without murmur. No JVD    Respiratory/Chest: CTA Bilaterally without wheezes or rales. Normal effort    GI/Abdomen: BS +, non-tender, non-distended Ext: no clubbing, cyanosis, or edema Psych: pleasant and cooperative   Skin: + Scaling dried skin on bilateral heels Neurologic: alert, oriented to person, place. Fair insight Phonation and speech intelligability normal. STM deficits. MAS 1 right elbow,MAS  tr to 1 in RIght finger and wrist flexors  MAS 1 in extensors of LE's MAS 1 L elbow , MAS 1+ in L wrist and finger flexors. Hypersensitivity to left hand improved, Left sided strength improved, decreased range of motion and pain at end range of motion in left shoulder,--motor/tone stable  Musculoskeletal:some limitations in right shoulder ROM     Assessment/Plan: 1. Functional deficits which require 3+ hours per day of  interdisciplinary therapy in a comprehensive inpatient rehab setting. Physiatrist is providing close team supervision and 24 hour management of active medical problems listed below. Physiatrist and rehab team continue to assess barriers to discharge/monitor patient progress toward functional and medical goals  Care Tool:  Bathing    Body parts bathed by patient: Face, Abdomen, Chest, Left upper leg, Right upper leg, Left arm, Front perineal area, Right arm, Right lower leg, Left lower leg, Buttocks   Body parts bathed by helper: Buttocks Body parts n/a: Right arm, Left arm, Front perineal area, Buttocks, Right upper leg, Left upper leg, Right lower leg, Left lower leg, Chest, Abdomen, Face   Bathing assist Assist Level: Supervision/Verbal cueing     Upper Body Dressing/Undressing Upper body dressing   What is the patient wearing?: Pull over shirt, Bra    Upper body assist Assist Level: Set up assist    Lower Body Dressing/Undressing Lower body dressing      What is the patient wearing?: Underwear/pull up, Pants     Lower body assist Assist for lower body dressing: Supervision/Verbal cueing     Toileting Toileting Toileting Activity did not occur (Clothing management and hygiene only): N/A (no void or bm)  Toileting assist Assist  for toileting: Supervision/Verbal cueing     Transfers Chair/bed transfer  Transfers assist  Chair/bed transfer activity did not occur: Safety/medical concerns  Chair/bed transfer assist level: Supervision/Verbal cueing Chair/bed transfer assistive device: Walker, Armrests   Locomotion Ambulation   Ambulation assist   Ambulation activity did not occur: Safety/medical concerns  Assist level: Contact Guard/Touching assist Assistive device: Walker-rolling Max distance: 300'   Walk 10 feet activity   Assist  Walk 10 feet activity did not occur: Safety/medical concerns  Assist level: Contact Guard/Touching assist Assistive device:  Walker-rolling   Walk 50 feet activity   Assist Walk 50 feet with 2 turns activity did not occur: Safety/medical concerns  Assist level: Contact Guard/Touching assist Assistive device: Walker-rolling    Walk 150 feet activity   Assist Walk 150 feet activity did not occur: Safety/medical concerns  Assist level: Contact Guard/Touching assist Assistive device: Walker-rolling    Walk 10 feet on uneven surface  activity   Assist Walk 10 feet on uneven surfaces activity did not occur: Safety/medical concerns   Assist level: Contact Guard/Touching assist Assistive device: Walker-rolling   Wheelchair     Assist Is the patient using a wheelchair?: Yes Type of Wheelchair: Manual    Wheelchair assist level: Supervision/Verbal cueing Max wheelchair distance: 156ft    Wheelchair 50 feet with 2 turns activity    Assist    Wheelchair 50 feet with 2 turns activity did not occur: Safety/medical concerns   Assist Level: Supervision/Verbal cueing   Wheelchair 150 feet activity     Assist  Wheelchair 150 feet activity did not occur: Safety/medical concerns   Assist Level: Supervision/Verbal cueing   Blood pressure 104/67, pulse 73, temperature 97.7 F (36.5 C), resp. rate 16, height 5\' 1"  (1.549 m), weight 58 kg, SpO2 97%.  Medical Problem List and Plan: 1. 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 Awaiting approval of disability. SW has continued to work on placement. We also have engaged leadership within hospital for options. Code status changed to full code after obtaining patient consent -change therapy to every day   2.   Impaired mobility: d/c lovenox since ambulating >150 feet, continue SCDs  3. Diffuse pains: resolved, gabapentin d/ced  4. History of anxiety: discussed with her mom that she feels this was a big risk factor for her accident, propanolol d/ced due to hypotension. Asked SW to place her on neuropsych list, Buspar ordered prn, continue this. Discussed placing outpatient referral for behavioral health f/u, discussed that Buspar has been helpful for her.  5. Neuropsych/cognition: This patient is not capable of making decisions on her own behalf  - 10/12: Still not capable of medical decision making; BUT oriented and communicating, following directions -  STM deficits - PRN melatonin 3mg    for insomnia  - Buspar 5mg  TID prn, continue  -1/21 continue melatonin for sleep  -1/23- will resume amantadine at 100mg  daily to see if it help focus 6. Skin: eucerin cream BID -Buttock/sacrum--foam dressing, pressure relief, nutrition -stage 3 left heel, resolved  7. Fluids/Electrolytes/Nutrition: Routine in and outs with follow-up chemistries  -Eating  well with cueing   Upgraded to regular diet  -Vit D 4000U daily  8.  Cavitary right lower lobe pneumonia likely aspiration pneumonia/MRSA pneumonia.  Resolved    Latest Ref Rng & Units 05/30/2023    5:25 AM 05/16/2023    6:28 AM 05/02/2023    5:20 AM  CBC  WBC 4.0 - 10.5 K/uL 5.3  6.2  5.9   Hemoglobin 12.0 - 15.0 g/dL 16.1  09.6  04.5   Hematocrit 36.0 - 46.0 % 41.5  42.1  43.0   Platelets 150 - 400 K/uL 223  300  241      9.  History of drug abuse.  Positive cocaine on urine drug screen.  Provide counseling  10.  AKI/hypovolemia and ATN.  Resolved     Latest Ref Rng & Units 05/30/2023    5:25 AM 05/16/2023    6:28 AM 05/02/2023    5:20 AM  BMP  Glucose 70 - 99 mg/dL 87  87  89   BUN 6 - 20 mg/dL 10  15  14    Creatinine 0.44 - 1.00 mg/dL 4.09  8.11  9.14   Sodium 135 - 145 mmol/L 137  136  138   Potassium 3.5 - 5.1 mmol/L 3.9  3.9  4.0    Chloride 98 - 111 mmol/L 107  105  109   CO2 22 - 32 mmol/L 25  22  23    Calcium 8.9 - 10.3 mg/dL 8.6  8.9  8.9    -BUN/creatinine stable 1/20  -Potassium 3.9 1/20  11.  Mild transaminitis with rhabdomyolysis.  Both resolved  12.  Hypotension:  D/c flomax, continue to monitor BP TID  Vitals:   06/03/23 0543 06/03/23 1400 06/03/23 1913 06/04/23 0424  BP: 103/70 118/75 100/69 104/68   06/04/23 1317 06/04/23 2009 06/05/23 0459 06/05/23 2006  BP: 101/74 103/69 101/73 (!) 97/56   06/06/23 0320 06/06/23 1455 06/06/23 1922 06/07/23 0302  BP: 105/77 99/77 101/66 104/67     13. HTN: resolved  -1/23 BP well-controlled and stable 14. Impaired initiation: resolved, d/c amantadine  -Consider restart amantadine.  15. Hyperglycemia: CBGs mildly elevated, d/ced checks. Prosource d/ced as per patient's request  16. Spasticity:  continue wrist, elbow brace, and PRAFOs. Ordered a night splint for LLE  off baclofen and tizanidine- much improved  17.  Dyskinetic movements in head and neck -  -  resolved   18. Cervical extensor weakness: improved, continue strengthening exercises  19. Bowel and bladder incontinence: continue bowel and bladder program. Flomax started for urgency-- d/c'd  20. Fatigue: resolved, d/c b complex   21. MRSA nares positive: negative on repeat, precautions d/ced  22. Tachycardic: resolved  23. S/p fall: CT head reviewed and is stable  24. Bilateral foot drop: bilateral AFOs ordered, discussed that these can help her to walk better, continue  25. Vaginal itching: resolved  26. Dysuria resolved  27. Poor sitting balance: continue OT/PT, improving  28. Headaches: topamax 19m daily prn ordered, will d/c since not requiring  29. Drowsiness with tramadol: d/ced  30. IUD in place: Pt states she has IUD x58yrs  and believes it's supposed to be removed soon(chart review- Dr. Seymour Bars placed Mirena IUD on 07/01/19, good for 59yrs); needs outpatient follow-up with  OB/GYN  31. Constipation: d/c baclofen, last BM 12/18, milk of magnesia ordered 12/20, resolved, magnesium supplement d/ced  -lbm 1/21 32. History of drug addiction: tramadol d/ced as per mom's preference  33. Depression: discussed scheduling for outpatient psychology/psychiatry follow-up, discussed neuropsych f/u while in hospital  -appreciate Dr. Kieth Brightly follow up  34. Sinusitis: claritin ordered prn, will d/c since not requiring  35. Pruritus: sarna lotion ordered, discussed that this is prn, will d/c since not requiring    LOS: 201 days A FACE TO FACE EVALUATION WAS PERFORMED  Ranelle Oyster 06/07/2023, 9:41 AM

## 2023-06-07 NOTE — Group Note (Signed)
Patient Details Name: ALISYN TACURI MRN: [id number] DOB: [birth year] Today's Date: 06/07/2023  Time Calculation: OT Group Time Calculation OT Group Start Time: 1430 OT Group Stop Time: 1530 OT Group Time Calculation (min): 60 min      Group Description: Dance Group: Pt participated in dance group with an emphasis on social interaction, motor planning, increasing overall activity tolerance and bimanual tasks. All songs were selected by group members. Dance moves included AROM of BUE/BLE gross motor movements with an emphasis on building functional endurance.    Individual level documentation: Patient completed group from sitting level. Patientt needed supervision to complete various dance moves with OT providing visual model.  Patient able to create her own modifications during group.  Pain:  0/10  Precautions:  Falls   AASHI GOHR 06/07/2023, 3:54 PM

## 2023-06-07 NOTE — Patient Care Conference (Signed)
Inpatient RehabilitationTeam Conference and Plan of Care Update Date: 06/06/23   Time: 1125 am    Patient Name: Nicole [name]      Medical Record Number: [id number]  Date of Birth: 08-28-1975 Sex: Female         Room/Bed: 4W09C/4W09C-01 Payor Info: Payor: [employer] / Plan: [employer] / Product Type: *No Product type* /    Admit Date/Time:  11/18/2022  3:15 PM  Primary Diagnosis:  Hypoxic brain injury [facility])  Hospital Problems: Principal Problem:   Hypoxic brain injury (HCC) Active Problems:   Protein-calorie malnutrition, severe   Essential hypertension   Pressure injury of skin   History of cocaine abuse (HCC)   Anxiety with depression    Expected Discharge Date: Expected Discharge Date:  (disability pending)  Team Members Present: Physician leading conference: Dr. Fanny Dance Social Worker Present: Dossie Der, LCSW Nurse Present: Chana Bode, RN;Karen Trilby Drummer, RN PT Present: Wynelle Link, PT OT Present: Bretta Bang, OT SLP Present: [name], SLP PPS Coordinator present : Fae Pippin, SLP     Current Status/Progress Goal Weekly Team Focus  Bowel/Bladder   Continent of B/B. LBm 06/05/23   Maintain continence.   Continue with timed toileting.    Swallow/Nutrition/ Hydration               ADL's   intermittent min assist   goals supervision - contact guard   selective attnetion, problem recognition/solving, self organizing with BADL, Functional use of LLE/LUE, discharge prep    Mobility   supervision bed mobility, supervision sit<>Stands, CGA stand pivot transfers with RW, supervision walking in hallway >351ft with RW, minA for in room ambulation due to decreased safety with tight spaces and obstacles. Wheelchair eval completed 1/21   Goals upgraded to supervision  floor transfers, dynamic balance and gait training, dual-cog tasks, w/c safety and mobility    Communication   vocal intensity across  variety of setting and unfamiliar listeners   Mod I   carryover of strategeis, challenging pt across sessions and listeners    Safety/Cognition/ Behavioral Observations  mod functional problem solving, min verbal problem solving   sup to min A   functional problem solving    Pain   Denies pain   Remain pain free   Assess Q4 and prn    Skin   Barrier cream to butt checks prn   No new skin breakdown  Assss QS and prn      Discharge Planning:  Mom can not take home and continue to work on obtaining disability so can get a bed for pt. Pt is aware of this plan, not happy but aware.   Team Discussion: Patient is admitted post hypoxic brain injury. Medically stable. Patient is  limited by ease of distraction  when performing activities.  Patient on target to meet rehab goals: Goals are set for contact guard assistance.  *See Care Plan and progress notes for long and short-term goals.   Revisions to Treatment Plan:  Medication adjustments Upgraded goals  Change to daily therapy sessions   Teaching Needs:  Medications, , safety, transfers, toileting, etc.  Current Barriers to Discharge: Lack of/limited family support  Possible Resolutions to Barriers: Disability pending      Medical Summary Current Status: spasticity improved. cognitively better but still with impaired awareness  Barriers to Discharge: Other (comments)  Barriers to Discharge Comments: no medical barriers Possible Resolutions to Barriers/Weekly Focus: medically stable for discharge, monitoring po intake, voiding,  cognition, and spasticit   Continued Need for Acute Rehabilitation Level of Care: The patient requires daily medical management by a physician with specialized training in physical medicine and rehabilitation for the following reasons: Medical management of patient stability for increased activity during participation in an intensive rehabilitation regime.: Yes Analysis of laboratory values  and/or radiology reports with any subsequent need for medication adjustment and/or medical intervention. : Yes   I attest that I was present, lead the team conference, and concur with the assessment and plan of the team.   Gwenyth Allegra 06/07/2023, 2:18 PM

## 2023-06-07 NOTE — Progress Notes (Signed)
Physical Therapy Session Note  Patient Details  Name: Nicole [name] MRN: [id number] Date of Birth: Sep 20, 1975  Today's Date: 06/07/2023 PT Individual Time: 4098-1191 PT Individual Time Calculation (min): 56 min   Short Term Goals: Week 1:  PT Short Term Goal 1 (Week 1): Pt will perform supine<>sit with supervision PT Short Term Goal 1 - Progress (Week 1): Other (comment) (new goal) PT Short Term Goal 2 (Week 1): Pt will ambulate at least 118ft with no more than min A, not using an AD. PT Short Term Goal 2 - Progress (Week 1): Other (comment) (new goal) PT Short Term Goal 3 (Week 1): Pt will demonstrate decreased fall risk as noted by an improvement of at least 7 points on the [employer] Test PT Short Term Goal 3 - Progress (Week 1): Other (comment) (new goal) PT Short Term Goal 4 (Week 1): Pt will navigate 4 steps using HRs with no more than CGA  Skilled Therapeutic Interventions/Progress Updates:      Pt sitting in wheelchair to start - agreeable to therapy treatment. Reports of lower back and hip pain, unrated. Provided mobility and distraction for pain management.   Sit<>Stand to RW with supervision. Ambulates with CGA to supervision assist with RW through rehab hallways to ortho gym, ~231ft. Worked on core strengthening at mat table with modified sit ups, modified hallow body holds, bridges with unilateral marches, and double knee to chest. Also incorporated 3# dumbbell strengthening for UE 's in standing - bicep curls, chest press, lateral raises, dead lifts, and row's. PT providing TC/VC for sequencing, muscle activation, and dosage.   Pt returned to her room at end of treatment and left sitting upright with all her needs met.   Therapy Documentation Precautions:  Precautions Precautions: Fall Precaution Comments: decreased stand balance- reliant on UE, Pain in LLE limits mobility at times Required Braces or Orthoses: Other Brace Other Brace: B adjustable night splints; B elbow  "cosey" splints; L palm protector; R mitt Restrictions Weight Bearing Restrictions Per Provider Order: No General:    Therapy/Grup: Individual Therapy  Jaionna Weisse P Salley Boxley 06/07/2023, 7:44 AM

## 2023-06-07 NOTE — Progress Notes (Signed)
Occupational Therapy Weekly Progress Note  Patient Details  Name: SHEENA PERCIFIELD MRN: [id number] Date of Birth: 1976-03-28  Beginning of progress report period: May 31, 2023 End of progress report period: June 07, 2023  Today's Date: 06/07/2023 OT Individual Time: 8756-4332 OT Individual Time Calculation (min): 45 min    Patient has met 1 of 3 short term goals.  Patient has not had opportunity for one goal due to construction project.    Patient continues to demonstrate the following deficits: muscle weakness and muscle joint tightness, impaired timing and sequencing, abnormal tone, unbalanced muscle activation, motor apraxia, decreased coordination, and decreased motor planning, decreased attention to left and decreased motor planning, decreased initiation, decreased attention, decreased awareness, decreased problem solving, decreased safety awareness, decreased memory, and delayed processing, and decreased standing balance, decreased postural control, hemiplegia, and decreased balance strategies and therefore will continue to benefit from skilled OT intervention to enhance overall performance with BADL and Reduce care partner burden.  Patient progressing toward long term goals..  Continue plan of care.  OT Short Term Goals Week 7:  OT Short Term Goal 1 (Week 7): Patient will complete hair care including putting hair into ponytail with intermittent min asssit OT Short Term Goal 1 - Progress (Week 7): Met OT Short Term Goal 2 (Week 7): Patient will prepare simple snack preparation with CGA OT Short Term Goal 2 - Progress (Week 7): Progressing toward goal OT Short Term Goal 3 (Week 7): Patient will complete laundry task with contact guard assist OT Short Term Goal 3 - Progress (Week 7): Progressing toward goal OT Short Term Goal 4 (Week 7): xxx OT Short Term Goal 4 - Progress (Week 7): Other (comment) OT Short Term Goal 5 (Week 7): xxx OT Short Term Goal 5 - Progress (Week 7): Other  (comment) Week 8: OT Short Term Goal 1 (Week 8): Patient will put hair into ponytail with CGA OT Short Term Goal 1 - Progress (Week 8): Other (comment) OT Short Term Goal 2 (Week 8): Patient will put on bra with no more than 3 cues for technique OT Short Term Goal 2 - Progress (Week 8): Other (comment) OT Short Term Goal 3 (Week 8): [name] will complete simple meal prep with min assist OT Short Term Goal 3 - Progress (Week 8): Other (comment) OT Short Term Goal 4 (Week 8): xxx OT Short Term Goal 4 - Progress (Week 8): Other (comment) OT Short Term Goal 5 (Week 8): xxx OT Short Term Goal 5 - Progress (Week 8): Other (comment)  Skilled Therapeutic Interventions/Progress Updates:    Patient received seated in wheelchair, agreeable to OT session. Patient walked to therapy gym with supervision using walker.  Worked on mechanics of sit to stand and stand to sit with emphasis on maintaining upright trunk and flexing forward at hips sufficiently to stand. Patient stands early, and then braces against surface behind with legs.  Patient with limited hip range left - limiting hip flexion.  Worked to improve hip range.   Addressed motor planning of transitional movements - obtaining quadruped, prone on elbows, sidelying to sidesitting with fewer prompts.  Patient with improved range of motion in LUE following body on arm movement.  Walked back to room and assisted patient to bathroom - continent void on toilet.  Left up in wheelchair with chair pad alarm in place and engaged.    Therapy Documentation Precautions:  Precautions Precautions: Fall Precaution Comments: decreased stand balance- reliant on UE, Pain in LLE limits  mobility at times Required Braces or Orthoses: Other Brace Other Brace: B adjustable night splints; B elbow "cosey" splints; L palm protector; R mitt Restrictions Weight Bearing Restrictions Per Provider Order: No   Pain: Pain Assessment Pain Scale: 0-10 Pain Score: 4  Pain Type:  Acute pain Pain Location: Back Pain Intervention(s): Medication (See eMAR)    Therapy/Group: Individual Therapy  [name] 06/07/2023, 12:08 PM

## 2023-06-08 NOTE — Progress Notes (Signed)
Physical Therapy Session Note  Patient Details  Name: Nicole [name] MRN: [id number] Date of Birth: 1975/09/30  Today's Date: 06/08/2023 PT Individual Time: 0922-0945 PT Individual Time Calculation (min): 23 min  Today's Date: 06/08/2023 PT Missed Time: 7 Minutes Missed Time Reason: Nursing care  Short Term Goals: Week 8: PT Short Term Goal 1 (Week 8): Pt will initiate bed mobility requiring no more than totalA. PT Short Term Goal 2 (Week 8): Pt will maintain sitting balance unsupported for up to 45 seconds PT Short Term Goal 3 (Week 8): Pt will demonstrate command follow for >10% of tasks. PT Short Term Goal 4 (Week 8): [name] RLE will demonstrate improved joint mobility into knee/ hip extension and ankle DF to neutral.  Skilled Therapeutic Interventions/Progress Updates: Patient supine in bed with nsg providing personal care on entrance to room. PTA came back shortly after with pt alert and agreeable to PT.  Patient reported unrated pain in mid/low back pain with pt stating "it feels like the devil." Pt stated that she might have pulled something during therapy the previous day. PTA educated pt on soft tissue mobilization and trigger point release with pt agreeing to having performed during short session.   Therapeutic Activity: Bed Mobility: Pt performed supine to R and L sidelying with supervision and VC to bridge hips over in order to laterally scoot to create space for sidelying  Manual Therapy: Palpation of B upper lumber/mid thoracic paraspinals, and B rhomboids performed with trigger points noted in R lower thoracic and L rhomboids musculature.  - Trigger point release to stated musculature with soft tissue mobilization to follow throughout. Pt tolerated manual therapy without adverse side effects. Pt reported increase in comfortability following treatment.   Patient supine in bed at end of session with brakes locked, bed alarm set, and all needs within reach.      Therapy  Documentation Precautions:  Precautions Precautions: Fall Precaution Comments: decreased stand balance- reliant on UE, Pain in LLE limits mobility at times Required Braces or Orthoses: Other Brace Other Brace: B adjustable night splints; B elbow "cosey" splints; L palm protector; R mitt Restrictions Weight Bearing Restrictions Per Provider Order: No  Therapy/Group: Individual Therapy  Kanda Deluna PTA 06/08/2023, 10:02 AM

## 2023-06-08 NOTE — NC FL2 (Addendum)
[place] MEDICAID FL2 LEVEL OF CARE FORM     IDENTIFICATION  Patient Name: ILO [name] Birthdate: 03/22/1976 Sex: female Admission Date (Current Location): 11/18/2022  [place] and [place] Number:  [facility]nes Bast [id number] S Facility and Address:  [facility], [address], [place], [place] [address]      Provider Number: [id number]  Attending Physician Name and Address:  Horton Chin, MD  Relative Name and Phone Number:  Quincy Carnes [phone]    Current Level of Care: Other (Comment) (rehab) Recommended Level of Care: Skilled Nursing Facility Prior Approval Number:    Date Approved/Denied:   PASRR Number: [id number]  Discharge Plan: SNF    Current Diagnoses: Patient Active Problem List   Diagnosis Date Noted   History of cocaine abuse (HCC) 05/17/2023   Anxiety with depression 05/17/2023   Essential hypertension 01/29/2023   Pressure injury of skin 01/29/2023   Protein-calorie malnutrition, severe 01/05/2023   Hypoxic brain injury (HCC) 11/18/2022   Cavitary pneumonia 11/05/2022   Substance use 11/05/2022   Acute respiratory failure (HCC) 10/31/2022   Nonallergic rhinitis 08/23/2020   Mild persistent asthma without complication 06/14/2020   Other allergic rhinitis 06/14/2020   Adverse reaction to food, subsequent encounter 06/14/2020   IBS (irritable bowel syndrome) 05/24/2016   Chronic venous insufficiency 05/24/2016   Migraine 05/24/2016   Pulmonary nodules 10/27/2014   Hepatitis C, chronic (HCC)    Depression 10/09/2013   OSA on CPAP - sees Dr. Jenne Campus in ENT 10/09/2013   Allergic rhinitis 05/30/2012   Cough variant asthma 02/08/2012    Orientation RESPIRATION BLADDER Height & Weight     Self, Place  Normal Incontinent Weight: 127 lb 13.9 oz (58 kg) Height:  5\' 1"  (154.9 cm)  BEHAVIORAL SYMPTOMS/MOOD NEUROLOGICAL BOWEL NUTRITION STATUS      Continent Diet (regular thin liquids)  AMBULATORY STATUS  COMMUNICATION OF NEEDS Skin   Limited Assist Verbally Normal, Other (Comment) (skin is healed) PU Stage 1 Dressing: Daily PU Stage 2 Dressing: Daily                   Personal Care Assistance Level of Assistance  Bathing, Dressing, Feeding Bathing Assistance: Limited assistance Feeding assistance: Limited assistance Dressing Assistance: Limited assistance Total Care Assistance: Limited assistance   Functional Limitations Info  Sight, Speech Sight Info: Impaired Hearing Info: Adequate Speech Info: Impaired    SPECIAL CARE FACTORS FREQUENCY  Bowel and bladder program     PT Frequency: 2-3 x week OT Frequency: 2-3 x week Bowel and Bladder Program Frequency: timed tolieting 3 hours   Speech Therapy Frequency: 2-3 x week      Contractures Contractures Info: Not present    Additional Factors Info  Code Status, Allergies Code Status Info: Full Code Allergies Info: [place] nuts, cashews, ceclor and nickel     Isolation Precautions Info: Off MRSA precautions and skin is healed due to long length of stay     Current Medications (06/08/2023):  This is the current hospital active medication list Current Facility-Administered Medications  Medication Dose Route Frequency Provider Last Rate Last Admin   acetaminophen (TYLENOL) tablet 650 mg  650 mg Oral Q6H PRN Charlton Amor, PA-C   650 mg at 06/07/23 1829   amantadine (SYMMETREL) capsule 100 mg  100 mg Oral Daily Faith Rogue T, MD   100 mg at 06/08/23 0743   busPIRone (BUSPAR) tablet 5 mg  5 mg Oral TID PRN Carlis Abbott Drema Pry, MD  5 mg at 05/27/23 2115   cholecalciferol (VITAMIN D3) 25 MCG (1000 UNIT) tablet 4,000 Units  4,000 Units Oral Daily Raulkar, Drema Pry, MD   4,000 Units at 06/08/23 7829   hydrocerin (EUCERIN) cream   Topical BID Horton Chin, MD   Given at 06/08/23 0743   melatonin tablet 3 mg  3 mg Oral QHS Fanny Dance, MD   3 mg at 06/07/23 2035   selenium sulfide (SELSUN) 1 % shampoo   Topical  PRN Horton Chin, MD   Given at 01/17/23 2056     Discharge Medications: Please see discharge summary for a list of discharge medications.  Relevant Imaging Results:  Relevant Lab Results:   Additional Information SSN: [id number]  Nicole [name], Nicole Livings, LCSW

## 2023-06-08 NOTE — Plan of Care (Signed)
Had a quite night. Required tylenol for back pain. Continent/incontinent at times. Safety maintained. Problem: Consults Goal: RH STROKE PATIENT EDUCATION Description: See Patient Education module for education specifics  Outcome: Progressing   Problem: RH BOWEL ELIMINATION Goal: RH STG MANAGE BOWEL WITH ASSISTANCE Description: STG Manage Bowel with mod I Assistance. Outcome: Progressing Goal: RH STG MANAGE BOWEL W/MEDICATION W/ASSISTANCE Description: STG Manage Bowel with Medication with mod I Assistance. Outcome: Progressing   Problem: RH BLADDER ELIMINATION Goal: RH STG MANAGE BLADDER WITH ASSISTANCE Description: STG Manage Bladder With toileting Assistance Outcome: Progressing   Problem: RH SKIN INTEGRITY Goal: RH STG MAINTAIN SKIN INTEGRITY WITH ASSISTANCE Description: STG Maintain Skin Integrity With min  Assistance. Outcome: Progressing

## 2023-06-08 NOTE — Progress Notes (Addendum)
Patient ID: Nicole [name], female   DOB: Mar 21, 1976, 48 y.o.   MRN: [id number]  Have played phone tag with [employer] and today she is off but did talk with [facility] to go over her case and have faxed information to her so can go over on Monday and see if could offer a be to pt. Aware it would be a LOG due to disability is still pending. Spoke with [employer] and she has the information finally now.

## 2023-06-08 NOTE — Progress Notes (Signed)
PROGRESS NOTE   Subjective/Complaints:  Says back is sore this morning. I asked her if she noticed any difference with amantadine and she wasn't even aware that she received it  ROS: Patient denies fever, rash, sore throat, blurred vision, dizziness, nausea, vomiting, diarrhea, cough, shortness of breath or chest pain, joint or back/neck pain, headache, or mood change. .     Objective:   No results found.   No results for input(s): "WBC", "HGB", "HCT", "PLT" in the last 72 hours.          No results for input(s): "NA", "K", "CL", "CO2", "GLUCOSE", "BUN", "CREATININE", "CALCIUM" in the last 72 hours.           Intake/Output Summary (Last 24 hours) at 06/08/2023 0944 Last data filed at 06/08/2023 0022 Gross per 24 hour  Intake 598 ml  Output --  Net 598 ml           Physical Exam: Vital Signs Blood pressure 97/64, pulse 78, temperature 98 F (36.7 C), resp. rate 16, height 5\' 1"  (1.549 m), weight 58 kg, SpO2 97%.  Constitutional: No distress . Vital signs reviewed. HEENT: NCAT, EOMI, oral membranes moist Neck: supple Cardiovascular: RRR without murmur. No JVD    Respiratory/Chest: CTA Bilaterally without wheezes or rales. Normal effort    GI/Abdomen: BS +, non-tender, non-distended Ext: no clubbing, cyanosis, or edema Psych: pleasant and cooperative   Skin: + Scaling dried skin on bilateral heels Neurologic: alert, oriented to person, place. Fair insight Phonation and speech intelligability normal. STM deficits. Minimal hypertonicity RUE and RLE. Hypersensitivity to left hand improved, Left sided strength improved, decreased range of motion and pain at end range of motion in left shoulder,--motor exam stable 1/24  Musculoskeletal:some limitations in right shoulder ROM, LB TTP     Assessment/Plan: 1. Functional deficits which require 3+ hours per day of interdisciplinary therapy in a  comprehensive inpatient rehab setting. Physiatrist is providing close team supervision and 24 hour management of active medical problems listed below. Physiatrist and rehab team continue to assess barriers to discharge/monitor patient progress toward functional and medical goals  Care Tool:  Bathing    Body parts bathed by patient: Face, Abdomen, Chest, Left upper leg, Right upper leg, Left arm, Front perineal area, Right arm, Right lower leg, Left lower leg, Buttocks   Body parts bathed by helper: Buttocks Body parts n/a: Right arm, Left arm, Front perineal area, Buttocks, Right upper leg, Left upper leg, Right lower leg, Left lower leg, Chest, Abdomen, Face   Bathing assist Assist Level: Supervision/Verbal cueing     Upper Body Dressing/Undressing Upper body dressing   What is the patient wearing?: Bra, Pull over shirt    Upper body assist Assist Level: Set up assist    Lower Body Dressing/Undressing Lower body dressing      What is the patient wearing?: Underwear/pull up, Pants     Lower body assist Assist for lower body dressing: Supervision/Verbal cueing     Toileting Toileting Toileting Activity did not occur (Clothing management and hygiene only): N/A (no void or bm)  Toileting assist Assist for toileting: Supervision/Verbal cueing     Transfers Chair/bed transfer  Transfers assist  Chair/bed transfer activity did not occur: Safety/medical concerns  Chair/bed transfer assist level: Supervision/Verbal cueing Chair/bed transfer assistive device: Walker, Armrests   Locomotion Ambulation   Ambulation assist   Ambulation activity did not occur: Safety/medical concerns  Assist level: Contact Guard/Touching assist Assistive device: Walker-rolling Max distance: 300'   Walk 10 feet activity   Assist  Walk 10 feet activity did not occur: Safety/medical concerns  Assist level: Contact Guard/Touching assist Assistive device: Walker-rolling   Walk 50 feet  activity   Assist Walk 50 feet with 2 turns activity did not occur: Safety/medical concerns  Assist level: Contact Guard/Touching assist Assistive device: Walker-rolling    Walk 150 feet activity   Assist Walk 150 feet activity did not occur: Safety/medical concerns  Assist level: Contact Guard/Touching assist Assistive device: Walker-rolling    Walk 10 feet on uneven surface  activity   Assist Walk 10 feet on uneven surfaces activity did not occur: Safety/medical concerns   Assist level: Contact Guard/Touching assist Assistive device: Walker-rolling   Wheelchair     Assist Is the patient using a wheelchair?: Yes Type of Wheelchair: Manual    Wheelchair assist level: Supervision/Verbal cueing Max wheelchair distance: 143ft    Wheelchair 50 feet with 2 turns activity    Assist    Wheelchair 50 feet with 2 turns activity did not occur: Safety/medical concerns   Assist Level: Supervision/Verbal cueing   Wheelchair 150 feet activity     Assist  Wheelchair 150 feet activity did not occur: Safety/medical concerns   Assist Level: Supervision/Verbal cueing   Blood pressure 97/64, pulse 78, temperature 98 F (36.7 C), resp. rate 16, height 5\' 1"  (1.549 m), weight 58 kg, SpO2 97%.  Medical Problem List and Plan: 1. 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 Awaiting approval of disability. SW has continued to work on placement. We also have engaged leadership within hospital for options. Code status changed to full code after obtaining patient consent -therapies are daily only 2.  Impaired mobility: d/c lovenox since  ambulating >150 feet, continue SCDs  3. Diffuse pains: resolved, gabapentin d/ced  4. History of anxiety: discussed with her mom that she feels this was a big risk factor for her accident, propanolol d/ced due to hypotension. Asked SW to place her on neuropsych list, Buspar ordered prn, continue this. Discussed placing outpatient referral for behavioral health f/u, discussed that Buspar has been helpful for her.  5. Neuropsych/cognition: This patient is not capable of making decisions on her own behalf  - 10/12: Still not capable of medical decision making; BUT oriented and communicating, following directions -  STM deficits - PRN melatonin 3mg    for insomnia  - Buspar 5mg  TID prn, continue  -1/21 continue melatonin for sleep  -1/24- resumed amantadine at 100mg  daily yesterday. No obvious effect so far 6. Skin: eucerin cream BID -Buttock/sacrum--foam dressing, pressure relief, nutrition -stage 3 left heel, resolved  7. Fluids/Electrolytes/Nutrition: Routine in and outs with follow-up chemistries  -Eating  well with cueing   Upgraded to regular diet  -Vit D 4000U daily      8.  Cavitary right lower lobe pneumonia likely aspiration pneumonia/MRSA pneumonia.  Resolved    Latest Ref Rng & Units 05/30/2023    5:25 AM 05/16/2023    6:28 AM 05/02/2023    5:20 AM  CBC  WBC 4.0 - 10.5 K/uL 5.3  6.2  5.9   Hemoglobin 12.0 - 15.0 g/dL 62.9  52.8  41.3   Hematocrit 36.0 - 46.0 % 41.5  42.1  43.0   Platelets 150 - 400 K/uL 223  300  241      9.  History of drug abuse.  Positive cocaine on urine drug screen.  Provide counseling  10.  AKI/hypovolemia and ATN.  Resolved     Latest Ref Rng & Units 05/30/2023    5:25 AM 05/16/2023    6:28 AM 05/02/2023    5:20 AM  BMP  Glucose 70 - 99 mg/dL 87  87  89   BUN 6 - 20 mg/dL 10  15  14    Creatinine 0.44 - 1.00 mg/dL 2.44  0.10  2.72   Sodium 135 - 145 mmol/L 137  136  138   Potassium 3.5 - 5.1 mmol/L 3.9  3.9  4.0   Chloride 98 - 111 mmol/L 107   105  109   CO2 22 - 32 mmol/L 25  22  23    Calcium 8.9 - 10.3 mg/dL 8.6  8.9  8.9    -BUN/creatinine stable 1/20  -Potassium 3.9 1/20  -1/24 will check cmet on monday 11.  Mild transaminitis with rhabdomyolysis.  Both resolved  12.  Hypotension:  D/c flomax, continue to monitor BP TID  Vitals:   06/03/23 1913 06/04/23 0424 06/04/23 1317 06/04/23 2009  BP: 100/69 104/68 101/74 103/69   06/05/23 0459 06/05/23 2006 06/06/23 0320 06/06/23 1455  BP: 101/73 (!) 97/56 105/77 99/77   06/06/23 1922 06/07/23 0302 06/07/23 1324 06/07/23 1949  BP: 101/66 104/67 93/71 97/64      13. HTN: resolved  -1/23 BP well-controlled and stable 14. Impaired initiation: resolved, d/c amantadine  -Consider restart amantadine.  15. Hyperglycemia: CBGs mildly elevated, d/ced checks. Prosource d/ced as per patient's request  16. Spasticity:   off baclofen and tizanidine- much improved  17.  Dyskinetic movements in head and neck -  -  resolved   18. Cervical extensor weakness: improved, continue strengthening exercises  19. Bowel and bladder incontinence: continue bowel and bladder program. Flomax started for urgency-- d/c'd  20. Fatigue: resolved, d/c b complex   21. MRSA nares positive: negative on repeat, precautions d/ced  22. Tachycardic: resolved  23. S/p fall: CT head reviewed and is stable  24. Bilateral foot drop: bilateral AFOs ordered, discussed that these can help her to walk better, continue  25. Vaginal itching: resolved  26. Dysuria resolved  27. Poor sitting balance: continue OT/PT, improving  28. Headaches: topamax 28m daily prn ordered, will d/c since not requiring  29. Drowsiness with tramadol: d/ced  30. IUD in place: Pt states she has IUD x43yrs  and believes it's supposed to be removed soon(chart review- Dr. Seymour Bars placed Mirena IUD on 07/01/19, good for 23yrs); needs outpatient follow-up with OB/GYN  31. Constipation: d/c baclofen, last BM 12/18, milk of magnesia  ordered 12/20, resolved, magnesium supplement d/ced  -lbm 1/23 32. History of drug addiction: tramadol d/ced as per mom's preference  33. Depression: discussed scheduling for outpatient psychology/psychiatry follow-up, discussed neuropsych f/u while in hospital  -appreciate Dr. Marvetta Gibbons follow up  34. Sinusitis: claritin ordered prn, will d/c since not requiring  35. Pruritus: sarna lotion ordered, discussed  that this is prn, will d/c since not requiring    LOS: 202 days A FACE TO FACE EVALUATION WAS PERFORMED  Ranelle Oyster 06/08/2023, 9:44 AM

## 2023-06-08 NOTE — Progress Notes (Signed)
Occupational Therapy Session Note  Patient Details  Name: Nicole [name] MRN: [id number] Date of Birth: March 07, 1976  Today's Date: 06/08/2023 OT Individual Time: 4098-1191 OT Individual Time Calculation (min): 34 min    Short Term Goals: Week 8: OT Short Term Goal 1 (Week 8): Patient will put hair into ponytail with CGA OT Short Term Goal 1 - Progress (Week 8): Other (comment) OT Short Term Goal 2 (Week 8): Patient will put on bra with no more than 3 cues for technique OT Short Term Goal 2 - Progress (Week 8): Other (comment) OT Short Term Goal 3 (Week 8): [name] will complete simple meal prep with min assist OT Short Term Goal 3 - Progress (Week 8): Other (comment) OT Short Term Goal 4 (Week 8): xxx OT Short Term Goal 4 - Progress (Week 8): Other (comment) OT Short Term Goal 5 (Week 8): xxx OT Short Term Goal 5 - Progress (Week 8): Other (comment)  Skilled Therapeutic Interventions/Progress Updates:    Patient received supine in bed reporting recent back pain 7-8/10 at start of session.  Agreeable to shower with use of warm water to relax back muscles.  Patient reported pain reduced to 4/10 during shower, reports feels better sitting up in chair.  Patient indicated need to have BM while showering, and able to control until she sat on commode for continent void.  Patient able to don pants with only two cues today.  Left up in wheelchair with safety belt in place and engaged and telesitter in place.  Personal items and lunch in reach.    Therapy Documentation Precautions:  Precautions Precautions: Fall Precaution Comments: decreased stand balance- reliant on UE, Pain in LLE limits mobility at times Required Braces or Orthoses: Other Brace Other Brace: B adjustable night splints; B elbow "cosey" splints; L palm protector; R mitt Restrictions Weight Bearing Restrictions Per Provider Order: No General: General PT Missed Treatment Reason: Nursing care   Pain:  5/10  back     Therapy/Group: Individual Therapy  [name] 06/08/2023, 12:13 PM

## 2023-06-08 NOTE — Progress Notes (Signed)
Speech Language Pathology Weekly Progress Note  Patient Details  Name: Nicole [name] MRN: [id number] Date of Birth: [birth year]  Beginning of progress report period: June 01, 2023 End of progress report period: June 08, 2023  Short Term Goals: Week 9: SLP Short Term Goal 1 (Week 9): Week 28 - Pt will solve mildly complex environmental problems with 70% accuracy given min assist. SLP Short Term Goal 1 - Progress (Week 9): Not met SLP Short Term Goal 2 (Week 9): Week 28 - Pt will recall basic daily information using external aids with 95% accuracy given supervision assist. SLP Short Term Goal 2 - Progress (Week 9): Met SLP Short Term Goal 3 (Week 9): Week 28 - Pt will utilize recommended handwriting strategies to achieve 100% legebility given supervision verbal cues in 4/5 opportunities at the phrase and sentence levels. SLP Short Term Goal 3 - Progress (Week 9): Met SLP Short Term Goal 4 (Week 9): Week 28 - Pt will utilize increased vocal intensity w/ supervision verbal cues to remain 95% intelligibile at conversation level in noisy environments in 4/5 opportunities. SLP Short Term Goal 4 - Progress (Week 9): Met  New Short Term Goals: Week 9: SLP Short Term Goal 1 (Week 9): Week 29 - Pt will solve mildly complex environmental problems with 70% accuracy given min assist. SLP Short Term Goal 2 (Week 9): Week 29 - Pt will recall basic daily information using external aids with 100% accuracy given supervision assist. SLP Short Term Goal 3 (Week 9): Week 29 - Pt will demonstrate emergent awareness of mistakes during functional environmental tasks and correct mistakes in 4/5 opportunities given mod assist. SLP Short Term Goal 4 (Week 9): Week 11 - Pt will utilize increased vocal intensity w/ maxA verbal cues to remain 70% intelligibile at phrase level  Weekly Progress Updates: Patient continues to make steady progress towards therapy goals, meeting 3/4 short term goals set for this reporting  period. Patient currently is at supervision level for handwriting and vocal intensity/speech intelligibility. Patient recalls daily information consistently utilizing external aids as needed with supervision. Patient's biggest barrier remains her functional problem solving, which she continues to require mod assist for overall. Patient and family education ongoing. Patient will continue to benefit from skilled therapy services during remainder of CIR stay.    Intensity: Minumum of 1-2 x/day, 30 to 90 minutes Frequency: 3 to 5 out of 7 days Duration/Length of Stay: TBD due to placement Treatment/Interventions: Cognitive remediation/compensation;Internal/external aids;Speech/Language facilitation;Cueing hierarchy;Environmental controls;Therapeutic Activities;Functional tasks;Multimodal communication approach;Patient/family education;Therapeutic Exercise  Pain Pain Assessment Pain Scale: 0-10 Pain Score: 0-No pain  Therapy/Group: Individual Therapy  [name], M.A., CCC-SLP  [name] 06/08/2023, 9:20 AM

## 2023-06-09 NOTE — Plan of Care (Signed)
  Problem: Consults Goal: RH STROKE PATIENT EDUCATION Description: See Patient Education module for education specifics  Outcome: Progressing   Problem: RH BOWEL ELIMINATION Goal: RH STG MANAGE BOWEL WITH ASSISTANCE Description: STG Manage Bowel with mod I Assistance. Outcome: Progressing Goal: RH STG MANAGE BOWEL W/MEDICATION W/ASSISTANCE Description: STG Manage Bowel with Medication with mod I Assistance. Outcome: Progressing   Problem: RH BLADDER ELIMINATION Goal: RH STG MANAGE BLADDER WITH ASSISTANCE Description: STG Manage Bladder With toileting Assistance Outcome: Progressing   Problem: RH SKIN INTEGRITY Goal: RH STG MAINTAIN SKIN INTEGRITY WITH ASSISTANCE Description: STG Maintain Skin Integrity With min  Assistance. Outcome: Progressing
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
## 2023-06-10 MED ORDER — LIDOCAINE 5 % EX PTCH
2.0000 | MEDICATED_PATCH | CUTANEOUS | Status: DC
  Administered 2023-06-10 – 2023-06-11 (×2): 2 via TRANSDERMAL
  Filled 2023-06-10 (×2): qty 2

## 2023-06-10 NOTE — Progress Notes (Signed)
PRN tylenol given at 2059 for complaint of back pain. Scheduled melatonin given at same time. Rested quietly thus far. Discolored area on buttock unchanged. SCD's applied at HS. Nicole [name] A
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
## 2023-06-11 ENCOUNTER — Inpatient Hospital Stay (HOSPITAL_COMMUNITY): Payer: MEDICAID
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
## 2023-06-11 NOTE — Progress Notes (Signed)
Physical Therapy Session Note  Patient Details  Name: Nicole [name] MRN: [id number] Date of Birth: 20-Nov-1975  Today's Date: 06/11/2023 PT Individual Time: 1015-1040 PT Individual Time Calculation (min): 25 min   Short Term Goals: Week 1:  PT Short Term Goal 1 (Week 1): Pt will perform supine<>sit with supervision PT Short Term Goal 1 - Progress (Week 1): Other (comment) (new goal) PT Short Term Goal 2 (Week 1): Pt will ambulate at least 166ft with no more than min A, not using an AD. PT Short Term Goal 2 - Progress (Week 1): Other (comment) (new goal) PT Short Term Goal 3 (Week 1): Pt will demonstrate decreased fall risk as noted by an improvement of at least 7 points on the [employer] Test PT Short Term Goal 3 - Progress (Week 1): Other (comment) (new goal) PT Short Term Goal 4 (Week 1): Pt will navigate 4 steps using HRs with no more than CGA  Skilled Therapeutic Interventions/Progress Updates:      Pt sitting in wheelchair to start - agreeable to therapy. Reports lower back pain, medical team aware.  Sit<>stand to RW with supervision. Ambulates throughout CIR gyms, >557ft, with supervision and RW - cues for pacing herself, keeping RW from veering R, and upright posture to limit strain on lower back.   On mat table, worked on lower back stretches: -Cat/Cow in 4 point position -Childs pose -trunk twists in supine with manual assist for PROM -B knees to chest in supine -Seated reaching for toes at EOM. *PT providing minA for positioning for each stretch/pose.   Pt reports mild improvement in back pain but pain remains ongoing. Returned to her room and patient left sitting in wheelchair with heating pad for lower back. All needs met.   Therapy Documentation Precautions:  Precautions Precautions: Fall Precaution Comments: decreased stand balance- reliant on UE, Pain in LLE limits mobility at times Required Braces or Orthoses: Other Brace Other Brace: B adjustable night splints;  B elbow "cosey" splints; L palm protector; R mitt Restrictions Weight Bearing Restrictions Per Provider Order: No General:     Therapy/Group: Individual Therapy  Orrin Brigham 06/11/2023, 7:52 AM

## 2023-06-11 NOTE — Progress Notes (Signed)
Patient ID: Nicole [name], female   DOB: 1975/08/17, 48 y.o.   MRN: [id number]  Spoke with [facility] Case manager who reports the records came and now with the MD so it does take some time and then will make a determination. Left message with Lelon Mast with [facility] regarding ability to tack pt with LOG

## 2023-06-11 NOTE — Progress Notes (Signed)
Occupational Therapy Session Note  Patient Details  Name: ABIHA LUKEHART MRN: [id number] Date of Birth: 1975-08-19  Today's Date: 06/11/2023 OT Individual Time: 5784-6962 OT Individual Time Calculation (min): 30 min    Short Term Goals: Week 8: OT Short Term Goal 1 (Week 8): Patient will put hair into ponytail with CGA OT Short Term Goal 1 - Progress (Week 8): Other (comment) OT Short Term Goal 2 (Week 8): Patient will put on bra with no more than 3 cues for technique OT Short Term Goal 2 - Progress (Week 8): Other (comment) OT Short Term Goal 3 (Week 8): [name] will complete simple meal prep with min assist OT Short Term Goal 3 - Progress (Week 8): Other (comment) OT Short Term Goal 4 (Week 8): xxx OT Short Term Goal 4 - Progress (Week 8): Other (comment) OT Short Term Goal 5 (Week 8): xxx OT Short Term Goal 5 - Progress (Week 8): Other (comment)  Skilled Therapeutic Interventions/Progress Updates:   Patient received supported sitting in bed reporting back pain persists.  Irritated because pain patch fell off at some point.  Patient agreeable to shower.  "I haven't washed my hair in 2 days."  Patient transported via wheelchair to shower due to back pain.  Bed mobility same level of function, but punctuated by discomfort.  Patient showered with min assist due to time.  Dressed herself with min assist for left leg.  Standing to brush teeth in under two minutes - when cued to remain on task.  Left up in wheelchair with chair pad alarm in place and engaged and call bell and personal items in reach.  Therapy Documentation Precautions:  Precautions Precautions: Fall Precaution Comments: decreased stand balance- reliant on UE, Pain in LLE limits mobility at times Required Braces or Orthoses: Other Brace Other Brace: B adjustable night splints; B elbow "cosey" splints; L palm protector; R mitt Restrictions Weight Bearing Restrictions Per Provider Order: No   Pain: Pain Assessment Pain  Scale: 0-10 Pain Score: 8  Pain Type: Acute pain Pain Location: Back Pain Orientation: Lower Pain Descriptors / Indicators: Aching Pain Frequency: Intermittent Pain Onset: Gradual Patients Stated Pain Goal: 2 Pain Intervention(s): Medication (See eMAR)       Therapy/Group: Individual Therapy  [name] 06/11/2023, 9:39 AM

## 2023-06-11 NOTE — Progress Notes (Signed)
Slept good. SCD's applied at HS. PRN tylenol given at 2141. Patient feels lidoderm patches have helped her back pain. Continues with telesitter. No attempts OOB this shift. Continent & incontinent of urine. While taking vital signs, patient says, "I'm peeing right now." "I should've told you." Provided patient with wipes and her pull up briefs to change self. Required minimal assistance. Alfredo Martinez A

## 2023-06-12 MED ORDER — MELATONIN 3 MG PO TABS
3.0000 mg | ORAL_TABLET | Freq: Every evening | ORAL | Status: DC | PRN
  Administered 2023-06-12 – 2023-07-16 (×31): 3 mg via ORAL
  Filled 2023-06-12 (×32): qty 1

## 2023-06-12 MED ORDER — BUSPIRONE HCL 10 MG PO TABS
5.0000 mg | ORAL_TABLET | Freq: Two times a day (BID) | ORAL | Status: DC | PRN
  Administered 2023-06-12: 5 mg via ORAL
  Filled 2023-06-12: qty 1

## 2023-06-12 MED ORDER — LIDOCAINE 5 % EX PTCH
2.0000 | MEDICATED_PATCH | CUTANEOUS | Status: DC
  Administered 2023-06-12 – 2023-07-19 (×37): 2 via TRANSDERMAL
  Filled 2023-06-12 (×37): qty 2

## 2023-06-12 NOTE — Progress Notes (Signed)
Speech Language Pathology Daily Session Note  Patient Details  Name: QUANIYA DAMAS MRN: [id number] Date of Birth: March 26, 1976  Today's Date: 06/12/2023 SLP Individual Time: 1400-1445 SLP Individual Time Calculation (min): 45 min  Short Term Goals: Week 9: SLP Short Term Goal 1 (Week 9): Week 29 - Pt will solve mildly complex environmental problems with 70% accuracy given min assist. SLP Short Term Goal 2 (Week 9): Week 29 - Pt will recall basic daily information using external aids with 100% accuracy given supervision assist. SLP Short Term Goal 3 (Week 9): Week 29 - Pt will demonstrate emergent awareness of mistakes during functional environmental tasks and correct mistakes in 4/5 opportunities given mod assist.  Skilled Therapeutic Interventions: Skilled therapy session focused on cognitive goals. SLP facilitated session by providing modA to complete vacation budgeting activity. Patient with increased difficulty problem solving and impulsivity this date stating she believed "$61" would cover the entire cost of a weeks vacation in [place]. SLP aided in organization of budget by prompting patient to name items in which she would need to budget for (ex. Hotel, flight, food, etc.). Patient required modA to name these items then utilize computer to research approximate cost. Patient required up to maxA this date during computer navigation task likely due to impulsivity and impetuousness. Patient returned to room and left in bed with alarm set and call bell in reach. Continue POC.    Pain Pain in back, nursing aware  Therapy/Group: Individual Therapy  Woodie Degraffenreid M.A., CF-SLP 06/12/2023, 7:43 AM

## 2023-06-12 NOTE — Progress Notes (Signed)
Occupational Therapy Session Note  Patient Details  Name: Nicole [name] MRN: [id number] Date of Birth: [birth year]  {CHL IP REHAB OT TIME CALCULATIONS:[id number]}   Short Term Goals: Week 27: OT Short Term Goal 1: Patient will put hair into ponytail with CGA OT Short Term Goal 2 (Week 27): Patient will put on bra with no more than 3 cues for technique OT Short Term Goal 3 (Week 27): [name] will complete simple meal prep with min assist   Skilled Therapeutic Interventions/Progress Updates:    Patient agreeable to participate in OT session. Reports *** pain level.   Patient participated in skilled OT session focusing on ***. Therapist facilitated/assessed/developed/educated/integrated/elicited *** in order to improve/facilitate/promote    Therapy Documentation Precautions:  Precautions Precautions: Fall Precaution Comments: decreased stand balance- reliant on UE, Pain in LLE limits mobility at times Required Braces or Orthoses: Other Brace Other Brace: B adjustable night splints; B elbow "cosey" splints; L palm protector; R mitt Restrictions Weight Bearing Restrictions Per Provider Order: No  Therapy/Group: Individual Therapy  Limmie Patricia, OTR/L,CBIS  Supplemental OT - [facility] and [facility] Secure Chat Preferred   06/12/2023, 10:19 PM

## 2023-06-12 NOTE — Progress Notes (Addendum)
Patient ID: Nicole [name], female   DOB: 1975-06-18, 48 y.o.   MRN: [id number]  have reached out to [facility] to discuss pt's case and send records to her to consider her for bed offer. Have faxed information to her and will await response. Still no response form Samatha from [facility]  2:28 PM Spoke with [facility] they will not take a LOG and have declined pt for admission
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
## 2023-06-12 NOTE — Progress Notes (Signed)
Occupational Therapy Session Note  Patient Details  Name: Nicole [name] MRN: [id number] Date of Birth: 03-Apr-1976  Today's Date: 06/12/2023 OT Individual Time: 1305-1330 OT Individual Time Calculation (min): 25 min    Short Term Goals: Week 8: OT Short Term Goal 1 (Week 8): Patient will put hair into ponytail with CGA OT Short Term Goal 1 - Progress (Week 8): Other (comment) OT Short Term Goal 2 (Week 8): Patient will put on bra with no more than 3 cues for technique OT Short Term Goal 2 - Progress (Week 8): Other (comment) OT Short Term Goal 3 (Week 8): [name] will complete simple meal prep with min assist OT Short Term Goal 3 - Progress (Week 8): Other (comment) OT Short Term Goal 4 (Week 8): xxx OT Short Term Goal 4 - Progress (Week 8): Other (comment) OT Short Term Goal 5 (Week 8): xxx OT Short Term Goal 5 - Progress (Week 8): Other (comment)  Skilled Therapeutic Interventions/Progress Updates:  Skilled OT intervention completed with focus on ADL retraining, functional mobility. Pt received upright in bed, agreeable to session. Lower back pain reported; pre-medicated. OT offered rest breaks and repositioning throughout for pain reduction.  Pt requested to use bathroom. Transitioned ot EOB with mod I. Completed all sit > stands and ambulatory transfers with supervision/CGA using RW during session.   Ambulated to bathroom, lowered clothing with supervision. Despite time, drinking water and privacy, pt unable to void. Stood and redonned pants with assist needed for buttoning tight pants. Ambulated to clothing drawer to retrieve new shirt. Cues needed for sequencing doffing of shirt, however supervision to donn new one while in stance. Ambulated > sink for hair grooming, in stance. Advised pt to put chap stick currently held in hand in pocket of pants to allow functional/more safe ambulation with RW.  Ambulated around rehab dept, around a total of 300 ft to visit a former/new staff member,  with 1 seated rest break provided, then ambulated back to room and transitioned back to upright in bed with mod I. Pt remained upright in bed, with bed alarm on/activated, and with all needs in reach at end of session.   Therapy Documentation Precautions:  Precautions Precautions: Fall Precaution Comments: decreased stand balance- reliant on UE, Pain in LLE limits mobility at times Required Braces or Orthoses: Other Brace Other Brace: B adjustable night splints; B elbow "cosey" splints; L palm protector; R mitt Restrictions Weight Bearing Restrictions Per Provider Order: No    Therapy/Group: Individual Therapy  Melvyn Novas, MS, OTR/L  06/12/2023, 1:34 PM

## 2023-06-12 NOTE — Progress Notes (Signed)
Physical Therapy Session Note  Patient Details  Name: Nicole [name] MRN: [id number] Date of Birth: January 07, 1976  Today's Date: 06/12/2023 PT Individual Time: 1030-1058 PT Individual Time Calculation (min): 28 min   Short Term Goals: Week 1:  PT Short Term Goal 1 (Week 1): Pt will perform supine<>sit with supervision PT Short Term Goal 1 - Progress (Week 1): Other (comment) (new goal) PT Short Term Goal 2 (Week 1): Pt will ambulate at least 13ft with no more than min A, not using an AD. PT Short Term Goal 2 - Progress (Week 1): Other (comment) (new goal) PT Short Term Goal 3 (Week 1): Pt will demonstrate decreased fall risk as noted by an improvement of at least 7 points on the [employer] Test PT Short Term Goal 3 - Progress (Week 1): Other (comment) (new goal) PT Short Term Goal 4 (Week 1): Pt will navigate 4 steps using HRs with no more than CGA  Skilled Therapeutic Interventions/Progress Updates:      Pt in bed to start - reports improvement in back pain with the lidocaine patches - 3/10 compared to 8/10 from yesterday.  Bed mobility completed with supervision. Donned tennis shoes with setupA as she sat EOB. Sit<>Stand to RW with supervision and pt requesting to brush her teeth, apply deodorant, and comb in her hair at the sink. Pt shows improved initiation and attention since medication change but continues to struggle with multi-tasking and dual-cog tasks. She held her hair tie, lip stick in her L hand as she attempted to brush her teeth with her R. She struggled with brushing her teeth unless hair was tied back. Supervision for standing balance as she completed these tasks.   Ambulated to day room rehab gym at supervision level using RW - improved posture, pace, and safety compared to yesterday. Worked on lower back stretches while she sat EOM - trunk twists with PROM for overpressure at end range. Used [employer] for lower back and lower traps to apply deep pressure for pain relief and  muscle release. Added overhead reaches and trunk twists while holding [employer].   Returned to her room and patient requesting to lie down to rest. All needs met at end of treatment.   Therapy Documentation Precautions:  Precautions Precautions: Fall Precaution Comments: decreased stand balance- reliant on UE, Pain in LLE limits mobility at times Required Braces or Orthoses: Other Brace Other Brace: B adjustable night splints; B elbow "cosey" splints; L palm protector; R mitt Restrictions Weight Bearing Restrictions Per Provider Order: No General:     Therapy/Group: Individual Therapy  Orrin Brigham 06/12/2023, 7:41 AM

## 2023-06-13 MED ORDER — MAGNESIUM GLUCONATE 500 MG PO TABS
250.0000 mg | ORAL_TABLET | Freq: Every day | ORAL | Status: DC
  Administered 2023-06-13 – 2023-07-18 (×36): 250 mg via ORAL
  Filled 2023-06-13 (×36): qty 1

## 2023-06-13 NOTE — Patient Care Conference (Signed)
Inpatient RehabilitationTeam Conference and Plan of Care Update Date: 06/13/2023   Time: 11:12 AM   Patient Name: Nicole [name]      Medical Record Number: [id number]  Date of Birth: 02-25-1976 Sex: Female         Room/Bed: 4W09C/4W09C-01 Payor Info: Payor: [employer] / Plan: [employer] / Product Type: *No Product type* /    Admit Date/Time:  11/18/2022  3:15 PM  Primary Diagnosis:  Hypoxic brain injury [facility])  Hospital Problems: Principal Problem:   Hypoxic brain injury (HCC) Active Problems:   Protein-calorie malnutrition, severe   Essential hypertension   Pressure injury of skin   History of cocaine abuse (HCC)   Anxiety with depression    Expected Discharge Date: Expected Discharge Date:  (pending)  Team Members Present: Physician leading conference: Dr. Sula Soda Social Worker Present: Dossie Der, LCSW Nurse Present: Chana Bode, RN;Crespin Forstrom Trilby Drummer, RN PT Present: Wynelle Link, PT OT Present: Mariann Barter, OT SLP Present: [name], SLP PPS Coordinator present : Fae Pippin, SLP     Current Status/Progress Goal Weekly Team Focus  Bowel/Bladder   Continent/incontinent. Last BM 1/27.   Toilet q3-4 hours q shift.   Continue with timed toileting.    Swallow/Nutrition/ Hydration   regular/thins           ADL's   Intermittent min assist - 25%/time   goals supervision - contact guard   selective attention, problem anticipation, recognition, solving, self organization within ADL, functional use of Left limbs    Mobility   mod I bed mobility, supervision transfers, supervision gait with RW in open spaces, minA gait with RW in rooms or tight spaces. Wheelchair eval completed   Supervision/CGA  QD - general mobility, safety awareness training, dual-cog tasks, dynamic standing balance, dynamic gait    Communication   vocal intensity across variety of settings and unfamiliar listeners   Mod I   carryover  of strategies accross settings    Safety/Cognition/ Behavioral Observations  min up to modA occasionally for functional problem solving   sup to min A   functional problem solving    Pain   Pain to mid and lower back 5-8/10. On lidocaine patch as scheduled and tylenol prn. kpad used intermittently per patient's request.   Pain 2/10.   Assess for pain q shift and prn.    Skin   Skin in clean, dry and intact. Continue to use barrier cream to buttocks prn.   No skin breakdown.  Assess skin q shift and prn.      Discharge Planning:  Continue to look for ALF vs SNF bed, continue to await disability approval-disability MD is reviewing currently   Team Discussion: Hypoxic brain injury. Lower back pain.  No changes with therapy. Min/Mod assist for functional problem solving.  Patient on target to meet rehab goals: Continue with placement.  *See Care Plan and progress notes for long and short-term goals.   Revisions to Treatment Plan:  X-ray negative. Lidocaine patches.  Amantadine stopped.   Monitor labs and VS Teaching Needs: Medications, safety, self care, transfers, toileting, skin care, etc.   Current Barriers to Discharge: Incontinence and Lack of/limited family support  Possible Resolutions to Barriers: SNF placement Regain control of bladder/bowel function     Medical Summary Current Status: low back pain, hypotension    Barriers to Discharge Comments: low back pain, hypotension Possible Resolutions to [employer] Focus: continue lidocaine patch and kpad, d/c amantadine   Continued  Need for Acute Rehabilitation Level of Care: The patient requires daily medical management by a physician with specialized training in physical medicine and rehabilitation for the following reasons: Direction of a multidisciplinary physical rehabilitation program to maximize functional independence : Yes Medical management of patient stability for increased activity during  participation in an intensive rehabilitation regime.: Yes Analysis of laboratory values and/or radiology reports with any subsequent need for medication adjustment and/or medical intervention. : Yes   I attest that I was present, lead the team conference, and concur with the assessment and plan of the team.   Jearld Adjutant 06/13/2023, 4:21 PM

## 2023-06-13 NOTE — Plan of Care (Signed)
  Problem: Consults Goal: RH STROKE PATIENT EDUCATION Description: See Patient Education module for education specifics  Outcome: Progressing   Problem: RH BOWEL ELIMINATION Goal: RH STG MANAGE BOWEL WITH ASSISTANCE Description: STG Manage Bowel with mod I Assistance. Outcome: Progressing Goal: RH STG MANAGE BOWEL W/MEDICATION W/ASSISTANCE Description: STG Manage Bowel with Medication with mod I Assistance. Outcome: Progressing   Problem: RH BLADDER ELIMINATION Goal: RH STG MANAGE BLADDER WITH ASSISTANCE Description: STG Manage Bladder With toileting Assistance Outcome: Progressing   Problem: RH SKIN INTEGRITY Goal: RH STG MAINTAIN SKIN INTEGRITY WITH ASSISTANCE Description: STG Maintain Skin Integrity With min  Assistance. Outcome: Progressing

## 2023-06-13 NOTE — Progress Notes (Signed)
Physical Therapy Session Note  Patient Details  Name: SHYIA FILLINGIM MRN: [id number] Date of Birth: March 21, 1976  Today's Date: 06/13/2023 PT Individual Time: 1135-1200 PT Individual Time Calculation (min): 25 min   Short Term Goals: Week 1:  PT Short Term Goal 1 (Week 1): Pt will perform supine<>sit with supervision PT Short Term Goal 1 - Progress (Week 1): Other (comment) (new goal) PT Short Term Goal 2 (Week 1): Pt will ambulate at least 157ft with no more than min A, not using an AD. PT Short Term Goal 2 - Progress (Week 1): Other (comment) (new goal) PT Short Term Goal 3 (Week 1): Pt will demonstrate decreased fall risk as noted by an improvement of at least 7 points on the [employer] Test PT Short Term Goal 3 - Progress (Week 1): Other (comment) (new goal) PT Short Term Goal 4 (Week 1): Pt will navigate 4 steps using HRs with no more than CGA  Skilled Therapeutic Interventions/Progress Updates:      Pt sitting in wheelchair to start - reports continued improvement in her lower back pain.   Sit<>stand to RW with supervision. Ambulates at supervision level to day room rehab gym, using the RW ~`178ft. Assisted onto [facility] and she completed 30 seconds on 1.5 minutes off x10 minutes. Completed in standing position with UE support - min cues for posture to reduce strain on lower back and full AROM bilaterally.   Returned to her room at end of treatment - left sitting upright with all her needs met.   Therapy Documentation Precautions:  Precautions Precautions: Fall Precaution Comments: decreased stand balance- reliant on UE, Pain in LLE limits mobility at times Required Braces or Orthoses: Other Brace Other Brace: B adjustable night splints; B elbow "cosey" splints; L palm protector; R mitt Restrictions Weight Bearing Restrictions Per Provider Order: No General:     Therapy/Group: Individual Therapy  Orrin Brigham 06/13/2023, 7:51 AM

## 2023-06-13 NOTE — Progress Notes (Signed)
PROGRESS NOTE   Subjective/Complaints: No new complaints this morning Lidocaine patch is helping with back pain  ROS: as per HPI. Denies CP, SOB, abd pain, N/V/D/C, or any other complaints at this time.  +low back pain- improved    Objective:   DG Lumbar Spine 2-3 Views Result Date: 06/11/2023 CLINICAL DATA:  Low back pain. EXAM: LUMBAR SPINE - 2-3 VIEW COMPARISON:  None Available. FINDINGS: Five lumbar type vertebra. There is no acute fracture or subluxation of the lumbar spine. The vertebral body heights are maintained. The visualized posterior elements are intact. An intrauterine device is noted over the pelvis. The soft tissues are unremarkable. IMPRESSION: Negative. Electronically Signed   By: Elgie Collard M.D.   On: 06/11/2023 15:23     No results for input(s): "WBC", "HGB", "HCT", "PLT" in the last 72 hours.          No results for input(s): "NA", "K", "CL", "CO2", "GLUCOSE", "BUN", "CREATININE", "CALCIUM" in the last 72 hours.           Intake/Output Summary (Last 24 hours) at 06/13/2023 0956 Last data filed at 06/13/2023 0720 Gross per 24 hour  Intake 951 ml  Output --  Net 951 ml           Physical Exam: Vital Signs Blood pressure 91/60, pulse 69, temperature 97.7 F (36.5 C), resp. rate 18, height 5\' 1"  (1.549 m), weight 58 kg, SpO2 96%.  Constitutional: No distress . Vital signs reviewed. Resting in bed, comfortable.  HEENT: NCAT, EOMI, oral membranes moist Neck: supple Cardiovascular: RRR without murmur. No JVD    Respiratory/Chest: CTA Bilaterally without wheezes or rales. Normal effort    GI/Abdomen: BS +, non-tender, non-distended Ext: no clubbing, cyanosis, or edema Psych: pleasant and cooperative   Skin: + Scaling dried skin on bilateral heels Neurologic: alert, oriented to person, place. Fair insight Phonation and speech intelligability normal. STM deficits. Minimal  hypertonicity RUE and RLE. Hypersensitivity to left hand improved, Left sided strength improved, decreased range of motion and pain at end range of motion in left shoulder,--motor exam stable 1/29  Musculoskeletal:some limitations in right shoulder ROM, LB TTP     Assessment/Plan: 1. Functional deficits which require 3+ hours per day of interdisciplinary therapy in a comprehensive inpatient rehab setting. Physiatrist is providing close team supervision and 24 hour management of active medical problems listed below. Physiatrist and rehab team continue to assess barriers to discharge/monitor patient progress toward functional and medical goals  Care Tool:  Bathing    Body parts bathed by patient: Face, Abdomen, Chest, Left upper leg, Right upper leg, Left arm, Front perineal area, Right arm, Right lower leg, Left lower leg, Buttocks   Body parts bathed by helper: Buttocks Body parts n/a: Right arm, Left arm, Front perineal area, Buttocks, Right upper leg, Left upper leg, Right lower leg, Left lower leg, Chest, Abdomen, Face   Bathing assist Assist Level: Supervision/Verbal cueing     Upper Body Dressing/Undressing Upper body dressing   What is the patient wearing?: Bra, Pull over shirt    Upper body assist Assist Level: Set up assist    Lower Body Dressing/Undressing Lower body dressing  What is the patient wearing?: Underwear/pull up, Pants     Lower body assist Assist for lower body dressing: Supervision/Verbal cueing     Toileting Toileting Toileting Activity did not occur (Clothing management and hygiene only): N/A (no void or bm)  Toileting assist Assist for toileting: Supervision/Verbal cueing     Transfers Chair/bed transfer  Transfers assist  Chair/bed transfer activity did not occur: Safety/medical concerns  Chair/bed transfer assist level: Supervision/Verbal cueing Chair/bed transfer assistive device: Armrests, Civil engineer, contracting   Ambulation assist   Ambulation activity did not occur: Safety/medical concerns  Assist level: Contact Guard/Touching assist Assistive device: Walker-rolling Max distance: 300'   Walk 10 feet activity   Assist  Walk 10 feet activity did not occur: Safety/medical concerns  Assist level: Contact Guard/Touching assist Assistive device: Walker-rolling   Walk 50 feet activity   Assist Walk 50 feet with 2 turns activity did not occur: Safety/medical concerns  Assist level: Contact Guard/Touching assist Assistive device: Walker-rolling    Walk 150 feet activity   Assist Walk 150 feet activity did not occur: Safety/medical concerns  Assist level: Contact Guard/Touching assist Assistive device: Walker-rolling    Walk 10 feet on uneven surface  activity   Assist Walk 10 feet on uneven surfaces activity did not occur: Safety/medical concerns   Assist level: Contact Guard/Touching assist Assistive device: Walker-rolling   Wheelchair     Assist Is the patient using a wheelchair?: Yes Type of Wheelchair: Manual    Wheelchair assist level: Supervision/Verbal cueing Max wheelchair distance: 156ft    Wheelchair 50 feet with 2 turns activity    Assist    Wheelchair 50 feet with 2 turns activity did not occur: Safety/medical concerns   Assist Level: Supervision/Verbal cueing   Wheelchair 150 feet activity     Assist  Wheelchair 150 feet activity did not occur: Safety/medical concerns   Assist Level: Supervision/Verbal cueing   Blood pressure 91/60, pulse 69, temperature 97.7 F (36.5 C), resp. rate 18, height 5\' 1"  (1.549 m), weight 58 kg, SpO2 96%.  Medical Problem List and Plan: 1. 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 Awaiting approval of disability. SW has continued to work on placement. We also have engaged leadership within hospital for options. Code status changed to full code after obtaining patient consent -therapies are daily only SW note reviewed and we do not have any accepting facilities thus far  2.  Impaired mobility: d/c lovenox since ambulating >150 feet, continue SCDs  3. Diffuse pains: resolved, gabapentin d/ced  4. History of anxiety: discussed with her mom that she feels this was a big risk factor for her accident, propanolol d/ced due to hypotension. Asked SW to place her on neuropsych list, Buspar ordered prn, continue this. Discussed placing outpatient referral for behavioral health f/u, discussed that Buspar has been helpful for her.  5. Neuropsych/cognition: This patient is not capable of making decisions on her own behalf  - 10/12: Still not capable of medical decision making; BUT oriented and communicating, following directions -  STM deficits   Decrease buspar to BID prn   6. Skin: eucerin cream BID -Buttock/sacrum--foam  dressing, pressure relief, nutrition -stage 3 left heel, resolved  7. Fluids/Electrolytes/Nutrition: Routine in and outs with follow-up chemistries  -Eating  well with cueing   Upgraded to regular diet  -Vit D 4000U daily      8.  Cavitary right lower lobe pneumonia likely aspiration pneumonia/MRSA pneumonia.  Resolved    Latest Ref Rng & Units 05/30/2023    5:25 AM 05/16/2023    6:28 AM 05/02/2023    5:20 AM  CBC  WBC 4.0 - 10.5 K/uL 5.3  6.2  5.9   Hemoglobin 12.0 - 15.0 g/dL 40.9  81.1  91.4   Hematocrit 36.0 - 46.0 % 41.5  42.1  43.0   Platelets 150 - 400 K/uL 223  300  241      9.  History of drug abuse.  Positive cocaine on urine drug screen.  Provide counseling  10.   AKI/hypovolemia and ATN.  Resolved     Latest Ref Rng & Units 05/30/2023    5:25 AM 05/16/2023    6:28 AM 05/02/2023    5:20 AM  BMP  Glucose 70 - 99 mg/dL 87  87  89   BUN 6 - 20 mg/dL 10  15  14    Creatinine 0.44 - 1.00 mg/dL 7.82  9.56  2.13   Sodium 135 - 145 mmol/L 137  136  138   Potassium 3.5 - 5.1 mmol/L 3.9  3.9  4.0   Chloride 98 - 111 mmol/L 107  105  109   CO2 22 - 32 mmol/L 25  22  23    Calcium 8.9 - 10.3 mg/dL 8.6  8.9  8.9    -BUN/creatinine stable 1/20  -Potassium 3.9 1/20  -1/24 will check cmet on monday 11.  Mild transaminitis with rhabdomyolysis.  Both resolved  12.  Hypotension:  D/c flomax, continue to monitor BP TID -1/25-26/25 BP fine, cont monitoring  Vitals:   06/09/23 0549 06/09/23 1321 06/09/23 1914 06/10/23 0420  BP: 103/77 104/73 112/71 100/71   06/10/23 1306 06/10/23 1959 06/11/23 0402 06/11/23 1908  BP: 91/63 101/67 109/71 103/70   06/12/23 0600 06/12/23 1338 06/12/23 2009 06/13/23 0522  BP: 120/81 101/69 96/60 91/60      13. HTN: resolved  -1/23 BP well-controlled and stable 14. Impaired initiation: resolved, d/c amantadine  -Consider restart amantadine-- restarted 100mg  daily  15. Hyperglycemia: CBGs mildly elevated, d/ced checks. Prosource d/ced as per patient's request  16. Spasticity:   off baclofen and tizanidine- much improved  17.  Dyskinetic movements in head and neck -  -  resolved   18. Cervical extensor weakness: improved, continue strengthening exercises  19. Bowel and bladder incontinence: continue bowel and bladder program. Flomax started for urgency-- d/c'd  -06/10/23 LBM today  20. Fatigue: resolved, d/c b complex   21. MRSA nares positive: negative on repeat, precautions d/ced  22. Tachycardic: resolved  23. S/p fall: CT head reviewed and is stable  24. Bilateral foot drop: bilateral AFOs ordered, discussed that these can help her to walk better, continue  25. Vaginal itching: resolved  26.  Dysuria resolved  27. Poor sitting balance: continue OT/PT, improving  28. Headaches: topamax 31m daily prn ordered, will d/c since not requiring  29. Drowsiness with tramadol: d/ced  30. IUD in place: Pt states she has IUD x105yrs  and believes it's supposed to be removed soon(chart review- Dr. Seymour Bars placed Mirena IUD on 07/01/19, good for 59yrs); needs outpatient follow-up with OB/GYN  31. Constipation: d/c baclofen, last  BM 12/18, milk of magnesia ordered 12/20  -LBM 1/27, restart magnesium gluconate 250mg  HS  32. History of drug addiction: tramadol d/ced as per mom's preference  33. Depression: discussed scheduling for outpatient psychology/psychiatry follow-up, discussed neuropsych f/u while in hospital  -appreciate Dr. Marvetta Gibbons follow up  34. Sinusitis: claritin ordered prn, will d/c since not requiring  35. Pruritus: sarna lotion ordered, discussed that this is prn, will d/c since not requiring  26. Low back pain: XR ordered and discussed that it is negative, continue lidocaine patch  27. Polypharmacy: amantadine d/ced since no changes noted with it  28. Insomnia: change melatonin to prn, continue    LOS: 207 days A FACE TO FACE EVALUATION WAS PERFORMED  Nicole [name] 06/13/2023, 9:56 AM

## 2023-06-14 MED ORDER — BUSPIRONE HCL 10 MG PO TABS
5.0000 mg | ORAL_TABLET | Freq: Every day | ORAL | Status: DC | PRN
  Administered 2023-07-02 – 2023-07-18 (×9): 5 mg via ORAL
  Filled 2023-06-14 (×9): qty 1

## 2023-06-14 NOTE — Progress Notes (Signed)
Occupational Therapy Session Note  Patient Details  Name: Nicole [name] MRN: [id number] Date of Birth: 22-Aug-1975  Today's Date: 06/14/2023 OT Individual Time: 5621-3086 OT Individual Time Calculation (min): 31 min    Short Term Goals: Week 9: OT Short Term Goal 1 (Week 9): week 18: Pt will transfer to toilet with mod A with RW OT Short Term Goal 1 - Progress (Week 9): Met OT Short Term Goal 2 (Week 9): Week 18: Pt will be able to come to EOB in prep for ADLs with min A OT Short Term Goal 2 - Progress (Week 9): Progressing toward goal OT Short Term Goal 3 (Week 9): Pt will feed self 50% of meal with min A OT Short Term Goal 3 - Progress (Week 9): Met OT Short Term Goal 4 (Week 9): Week 18: Pt will use external aids to be oriented x3 with mod A OT Short Term Goal 4 - Progress (Week 9): Not met  Skilled Therapeutic Interventions/Progress Updates:  Pt greeted at nurses station, pt agreeable to OT intervention.      Transfers/bed mobility/functional mobility: pt completed stand pivot back to bed at end of session with RW and CGA. Sit>supine with supervision.   Therapeutic activity:   Patient actively participated in the [employer] program in a 1:1 setting. Session focused on education and training in breathing techniques to regulate the nervous system, gentle yoga poses with a focus on standing balance, relieving neck pain, improved motor planning, improved strength and coordination. Provided  guided meditation to regulate attention. . Patient required MAX cues and MIN assist for standing balance tasks.    Ended session with pt supine in bed with all needs within reach and bed alarm activated.                    Therapy Documentation Precautions:  Precautions Precautions: Fall Precaution Comments: decreased stand balance- reliant on UE, Pain in LLE limits mobility at times Required Braces or Orthoses: Other Brace Other Brace: B adjustable night splints; B elbow "cosey" splints;  L palm protector; R mitt Restrictions Weight Bearing Restrictions Per Provider Order: No    Pain: Unrated pain reported in neck, massage and stretching provided for pain mgmt   Therapy/Group: Individual Therapy  Barron Schmid 06/14/2023, 12:17 PM

## 2023-06-14 NOTE — Progress Notes (Signed)
Recreational Therapy Session Note  Patient Details  Name: Nicole [name] MRN: [id number] Date of Birth: 16-Sep-1975 Today's Date: 06/14/2023  Pain: no c/o Skilled Therapeutic Interventions/Progress Updates:  Goal:  Pt wisl maintain dynamic standing balance with CGA during moderate leisure activity.  MET  Pt expressed desire to play Wii bowling.  Pt stood with CGA to bowl using RUE for game play.  Pt with difficulty with timing of release of button during game play needing additional time to complete task.  Pt smiling & laughing throughout this session. [name] 06/14/2023, 3:43 PM
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
## 2023-06-14 NOTE — Progress Notes (Signed)
Physical Therapy Session Note  Patient Details  Name: Nicole [name] MRN: [id number] Date of Birth: 08-21-1975  Today's Date: 06/14/2023 PT Individual Time: 1415-1500 PT Individual Time Calculation (min): 45 min   Short Term Goals: Week 1:  PT Short Term Goal 1 (Week 1): Pt will perform supine<>sit with supervision PT Short Term Goal 1 - Progress (Week 1): Other (comment) (new goal) PT Short Term Goal 2 (Week 1): Pt will ambulate at least 173ft with no more than min A, not using an AD. PT Short Term Goal 2 - Progress (Week 1): Other (comment) (new goal) PT Short Term Goal 3 (Week 1): Pt will demonstrate decreased fall risk as noted by an improvement of at least 7 points on the [employer] Test PT Short Term Goal 3 - Progress (Week 1): Other (comment) (new goal) PT Short Term Goal 4 (Week 1): Pt will navigate 4 steps using HRs with no more than CGA  Skilled Therapeutic Interventions/Progress Updates:      Pt sitting in wheelchair and ready for therapy. Has no specific reports of pain during treatment. Pt requests to go outside for fresh air. Sit<>Stand with supervision to RW and she ambulates at supervision level using the RW from CIR floor (standing rest break in elevator) through [facility] to outside. Distances > 749ft. While outdoors, discussed fall prevention, general safety awareness, and worked on recall of prior events in previous therapy sessions. Returned upstairs to [employer] floor and assisted onto [employer]. She completed x8 minutes at L3 resistance with BUE/BLE and played pt selected music to help with cadence, rhythm, and coordination training. Pt easily distracted in gym environment. Returned to her room and patient ended treatment sitting upright in wheelchair. All needs met. Provided coffee per her request (ice added for safety measures).   Therapy Documentation Precautions:  Precautions Precautions: Fall Precaution Comments: decreased stand balance- reliant on UE, Pain in LLE  limits mobility at times Required Braces or Orthoses: Other Brace Other Brace: B adjustable night splints; B elbow "cosey" splints; L palm protector; R mitt Restrictions Weight Bearing Restrictions Per Provider Order: No General:     Therapy/Group: Individual Therapy  Orrin Brigham 06/14/2023, 7:40 AM
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
## 2023-06-15 NOTE — Progress Notes (Signed)
Recreational Therapy Session Note  Patient Details  Name: Nicole [name] MRN: [id number] Date of Birth: May 26, 1975 Today's Date: 06/15/2023  Pain: no c/o Skilled Therapeutic Interventions/Progress Updates:  Pt agreeable to therapy session.  Once in the kitchen, pt stated need for toileting.  Returned to her room but pt unable to void.  Pt required CGA to perform toilet transfer and for toileting.  Pt with excessive movement while seated on the toilets, leaning, weaving, swaying.  Pt without complaints other than frustrated by inability to void.  Nursing made aware.  Derrek Puff 06/15/2023, 12:19 PM

## 2023-06-15 NOTE — Progress Notes (Signed)
Occupational Therapy Session Note  Patient Details  Name: Nicole [name] MRN: [id number] Date of Birth: June 09, 1975  Today's Date: 06/15/2023 OT Individual Time: 1300-1329 OT Individual Time Calculation (min): 29 min    Short Term Goals: Week 27: OT Short Term Goal 1: Patient will put hair into ponytail with CGA OT Short Term Goal 2 (Week 27): Patient will put on bra with no more than 3 cues for technique OT Short Term Goal 3 (Week 27): [name] will complete simple meal prep with min assis  Skilled Therapeutic Interventions/Progress Updates:     Pt received sitting up in wc, dressed and ready for the day with all BADL needs met. Pt presenting to be in good spirits receptive to skilled OT session reporting 0/10 pain- OT offering intermittent rest breaks, repositioning, and therapeutic support to optimize participation in therapy session. Focus this session R FMC and functional mobility training. Pt completed functional mobility to therapy gym this session ~150 ft using RW with CGA. Seated at table top, engaged Pt desired coloring activity to work on [facility] with focus on R hand strength, VM/FMC, and attention. Pt able to notice that pencils were dull and appropriately request assistance from nursing staff to sharpen pencils at beginning of session. Pt able to tolerate ~15 minutes of coloring with short rest breaks to stretch and rest R hand and remain within lines at ~75% accuracy. Pt reporting fatigue in her legs and asking to utilize w/c to return to her room. Engaged Pt in self-propelling w/c to work on functional mobility and endurance training with Pt able to propel chair ~150 ft with supervision- verbal cues required for safety awareness to avoid obstacles. Pt requesting to return to bed at end of session. Stand step wc > EOB CGA using RW. EOB > supine supervision. Pt was left resting in bed with call bell in reach, bed alarm on, telesitter on, and all needs met.    Therapy Documentation Precautions:   Precautions Precautions: Fall Precaution Comments: decreased stand balance- reliant on UE, Pain in LLE limits mobility at times Required Braces or Orthoses: Other Brace Other Brace: B adjustable night splints; B elbow "cosey" splints; L palm protector; R mitt Restrictions Weight Bearing Restrictions Per Provider Order: No  Therapy/Group: Individual Therapy  LOUISIANA SEARLES 06/15/2023, 4:01 PM

## 2023-06-15 NOTE — Progress Notes (Signed)
Physical Therapy Session Note  Patient Details  Name: Nicole [name] MRN: [id number] Date of Birth: 1975-08-30  Today's Date: 06/15/2023 PT Individual Time: 1115-1200 PT Individual Time Calculation (min): 45 min   Short Term Goals: Week 1:  PT Short Term Goal 1 (Week 1): Pt will perform supine<>sit with supervision PT Short Term Goal 1 - Progress (Week 1): Other (comment) (new goal) PT Short Term Goal 2 (Week 1): Pt will ambulate at least 180ft with no more than min A, not using an AD. PT Short Term Goal 2 - Progress (Week 1): Other (comment) (new goal) PT Short Term Goal 3 (Week 1): Pt will demonstrate decreased fall risk as noted by an improvement of at least 7 points on the [employer] Test PT Short Term Goal 3 - Progress (Week 1): Other (comment) (new goal) PT Short Term Goal 4 (Week 1): Pt will navigate 4 steps using HRs with no more than CGA  Skilled Therapeutic Interventions/Progress Updates:      Pt sitting in wheelchair at rehab gym - agreeable to PT tx. Has no c/o pain, reports improvement in her lower back pain.   Session focused on participating at group level dance with individual treatment to focus on rhythmic movements, coordination, motor planning, and dynamic balance training. Min cues for awareness, selective attention due to busy and high energy gym space. Dance completed at both wheelchair level and standing. MinA for dynamic standing balance while participating in various stepping sequences (forward/backward, lateral, marching in place, kicking, etc). Delayed balance recovery strategies in standing.   Ended treatment in her room with setupA for her meal. All needs met. Chair pad alarm on.   Therapy Documentation Precautions:  Precautions Precautions: Fall Precaution Comments: decreased stand balance- reliant on UE, Pain in LLE limits mobility at times Required Braces or Orthoses: Other Brace Other Brace: B adjustable night splints; B elbow "cosey" splints; L palm  protector; R mitt Restrictions Weight Bearing Restrictions Per Provider Order: No General:     Therapy/Group: Individual Therapy  Orrin Brigham 06/15/2023, 7:44 AM

## 2023-06-15 NOTE — Progress Notes (Signed)
Patient ID: Nicole [name], female   DOB: 1976-03-22, 48 y.o.   MRN: [id number]  Message left for Marilee at [facility] no response from them regarding ability to offer bed. Spoke with Mom yesterday and she received SSI papers in mail which basically said applied for if not eligible for SSD. Continue to await response from MD at [facility] regarding determination.

## 2023-06-15 NOTE — Progress Notes (Signed)
Speech Language Pathology Weekly Progress and Session Note  Patient Details  Name: Nicole [name] MRN: [id number] Date of Birth: 14-Oct-1975  Beginning of progress report period: June 08, 2023 End of progress report period: June 15, 2023  Today's Date: 06/15/2023 SLP Individual Time: 1027-2536 25 minutes     Short Term Goals: Week 9: SLP Short Term Goal 1 - Week 29 - Pt will solve mildly complex environmental problems with 70% accuracy given min assist. SLP Short Term Goal 1 - Progress): Not met SLP Short Term Goal 2 Week 29 - Pt will recall basic daily information using external aids with 100% accuracy given supervision assist. SLP Short Term Goal 2 - Progress : Met SLP Short Term Goal 3 : Week 29 - Pt will demonstrate emergent awareness of mistakes during functional environmental tasks and correct mistakes in 4/5 opportunities given mod assist. SLP Short Term Goal 3 - Progress : Met  New Short Term Goals: Week 9: SLP Short Term Goal 1: Week 30 - Pt will solve mildly complex environmental problems with 70% accuracy given min assist. SLP Short Term Goal 2: Week 30 - Pt will demonstrate emergent awareness of mistakes during functional environmental tasks and correct mistakes in 3/5 opportunities given min assist.  Weekly Progress Updates: Patient has made good progress towards therapy goals, meeting 2/3 short term goals set this reporting period. Patient currently recalls daily events using external aids as needed with 100% accuracy when given supervision assist. Patient's biggest barrier continues to be functional problem solving, and patient continues to require mod assist to solve functional problems within her environment. Patient does demonstrate emergent awareness of mistakes in on average 4/5 opportunities during these tasks when given mod assist overall. Patient and family education ongoing. Patient will continue to benefit from skilled therapy services during remainder of CIR stay.      Intensity: Minumum of 1-2 x/day, 30 to 90 minutes Frequency: 3 to 5 out of 7 days Duration/Length of Stay: TBD due to placement Treatment/Interventions:  Cognitive remediation/compensation;Internal/external aids;Speech/Language facilitation;Cueing hierarchy;Environmental controls;Therapeutic Activities;Functional tasks;Multimodal communication approach;Patient/family education;Therapeutic Exercise  Daily Session  Skilled Therapeutic Interventions: SLP conducted skilled therapy session targeting cognitive retraining goals. Patient recalled information from previous speech therapy session given supervision. Patient participated in coloring activity continued from a prior therapy session. She benefited from min cues for task continuation and impulse control. Switched to computer navigation task. Patient exhibited impulse control with mod cues and benefited from min-mod cues for problem solving during mildly complex computer task. Patient was left in lowered bed with call bell in reach and bed alarm set. SLP will continue to target goals per plan of care.       Pain Pain Assessment Pain Scale: 0-10 Pain Score: 3   Therapy/Group: Individual Therapy  [name], M.A., CCC-SLP  [name] 06/15/2023, 7:58 AM

## 2023-06-15 NOTE — Plan of Care (Signed)
Slept well last night. Denies pain this am. No new changes to report. Safety maintained. Problem: Consults Goal: RH STROKE PATIENT EDUCATION Description: See Patient Education module for education specifics  Outcome: Progressing   Problem: RH BOWEL ELIMINATION Goal: RH STG MANAGE BOWEL WITH ASSISTANCE Description: STG Manage Bowel with mod I Assistance. Outcome: Progressing Goal: RH STG MANAGE BOWEL W/MEDICATION W/ASSISTANCE Description: STG Manage Bowel with Medication with mod I Assistance. Outcome: Progressing   Problem: RH BLADDER ELIMINATION Goal: RH STG MANAGE BLADDER WITH ASSISTANCE Description: STG Manage Bladder With toileting Assistance Outcome: Progressing   Problem: RH SKIN INTEGRITY Goal: RH STG MAINTAIN SKIN INTEGRITY WITH ASSISTANCE Description: STG Maintain Skin Integrity With min  Assistance. Outcome: Progressing
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
## 2023-06-18 NOTE — Progress Notes (Signed)
Spoke to MD Raulkar regarding ortho orders. Patient refuses and does not wear orthotics. MD stated okay to discontinue. Orders discontinued. Cletis Media, RN

## 2023-06-18 NOTE — Progress Notes (Signed)
Speech Language Pathology Daily Session Note  Patient Details  Name: XARENI KELCH MRN: [id number] Date of Birth: 1975/05/23  Today's Date: 06/18/2023 SLP Individual Time: 0300-0338 SLP Individual Time Calculation (min): 38 min  Short Term Goals: Week 9: SLP Short Term Goal 1 (Week 9): Week 30 - Pt will solve mildly complex environmental problems with 70% accuracy given min assist. SLP Short Term Goal 2 (Week 9): Week 30 - Pt will demonstrate emergent awareness of mistakes during functional environmental tasks and correct mistakes in 3/5 opportunities given min assist. SLP Short Term Goal 3 (Week 9): Week 11 - Pt will answer basic yes/no questions with modA in 60% of opportunities SLP Short Term Goal 4 (Week 9): Week 11 - Pt will utilize increased vocal intensity w/ maxA verbal cues to remain 70% intelligibile at phrase level  Skilled Therapeutic Interventions:  Patient was seen in PM to address cognitive re- training. Pt was alert and seen bedside upon SLP arrival. She denied pain and was agreeable for participation. Pt transferred to [facility] after donning shoes and clinician placement of WC with good patience before initiating transfer. Pt transported to speech therapy office for completion of session. Pt challenged in mildly complex budgeting task through challenging pt to plan a dinner meal within a set budget. Pt identified a list of guest, determined 2 meal options and identified ingredients with min to mod A. Unable to complete budgeting portion due to time, SLP further addressed problem solving through fall risk identification. Pt completed task with mod A and decreased awareness of precautions. Pt transported back to room where she was transferred back to bed with call button was placed within reach and bed alarm active. SLP to continue POC.   Pain Pain Assessment Pain Scale: 0-10 Pain Score: 0-No pain  Therapy/Group: Individual Therapy  [name] 06/18/2023, 3:58 PM

## 2023-06-18 NOTE — Progress Notes (Signed)
Slept good. PRN tylenol and melatonin given per patient's request at 2027. SCD's applied at HS. Decrease complaint of back pain, since lidoderm patches started. Taking meds whole in applesauce. Nicole [name]

## 2023-06-18 NOTE — Plan of Care (Signed)
  Problem: Consults Goal: RH STROKE PATIENT EDUCATION Description: See Patient Education module for education specifics  Outcome: Progressing   Problem: RH BOWEL ELIMINATION Goal: RH STG MANAGE BOWEL WITH ASSISTANCE Description: STG Manage Bowel with mod I Assistance. Outcome: Progressing Goal: RH STG MANAGE BOWEL W/MEDICATION W/ASSISTANCE Description: STG Manage Bowel with Medication with mod I Assistance. Outcome: Progressing   Problem: RH BLADDER ELIMINATION Goal: RH STG MANAGE BLADDER WITH ASSISTANCE Description: STG Manage Bladder With toileting Assistance Outcome: Progressing   Problem: RH SKIN INTEGRITY Goal: RH STG MAINTAIN SKIN INTEGRITY WITH ASSISTANCE Description: STG Maintain Skin Integrity With min  Assistance. Outcome: Progressing

## 2023-06-18 NOTE — Progress Notes (Signed)
Occupational Therapy Session Note  Patient Details  Name: Nicole [name] MRN: [id number] Date of Birth: 06-28-1975  Today's Date: 06/18/2023 OT Individual Time: 1914-7829 OT Individual Time Calculation (min): 33 min    Short Term Goals: Week 9: OT Short Term Goal 1 (Week 9): week 18: Pt will transfer to toilet with mod A with RW OT Short Term Goal 1 - Progress (Week 9): Met OT Short Term Goal 2 (Week 9): Week 18: Pt will be able to come to EOB in prep for ADLs with min A OT Short Term Goal 2 - Progress (Week 9): Progressing toward goal OT Short Term Goal 3 (Week 9): Pt will feed self 50% of meal with min A OT Short Term Goal 3 - Progress (Week 9): Met OT Short Term Goal 4 (Week 9): Week 18: Pt will use external aids to be oriented x3 with mod A OT Short Term Goal 4 - Progress (Week 9): Not met  Skilled Therapeutic Interventions/Progress Updates:   Pt greeted supine in bed, pt agreeable to OT intervention.      Transfers/bed mobility/functional mobility: pt completed supine<>sit with supervision. Functional mobility and ADL transfers completed with RW and CGA.    ADLs:  Grooming: pt stood at sink for hand hygiene with supervision.  LB dressing: donned pants with MAX A for time mgmt from toilet d/t incontinent urine void.  Transfers: ambulatory toilet transfer with RW and supervision  Toileting: pt completed 3/3 toileting tasks with supervision, incontinent urine void.   Exercises:  Pt continues to present with impaired shoulder ROM in bilateral shoulders however L > R. Worked on scapular mobilization from sidelying and supine. Emphasis on active assist from sitting/sidelying position to pts tolerance.   Ended session with pt supine in bed with all needs within reach and bed alarm activated.                    Therapy Documentation Precautions:  Precautions Precautions: Fall Precaution Comments: decreased stand balance- reliant on UE, Pain in LLE limits mobility at times Required  Braces or Orthoses: Other Brace Other Brace: B adjustable night splints; B elbow "cosey" splints; L palm protector; R mitt Restrictions Weight Bearing Restrictions Per Provider Order: No  Pain: unrated pain reported in L shoulder, rest breaks and repositioning provided as needed     Therapy/Group: Individual Therapy  [name] [facility] 06/18/2023, 3:05 PM
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
## 2023-06-18 NOTE — Progress Notes (Signed)
Physical Therapy Session Note  Patient Details  Name: SIRINITY OUTLAND MRN: [id number] Date of Birth: 1976-04-22  Today's Date: 06/18/2023 PT Individual Time: 1300-1330 PT Individual Time Calculation (min): 30 min   Short Term Goals: Week 1:  PT Short Term Goal 1 (Week 1): Pt will perform supine<>sit with supervision PT Short Term Goal 1 - Progress (Week 1): Other (comment) (new goal) PT Short Term Goal 2 (Week 1): Pt will ambulate at least 176ft with no more than min A, not using an AD. PT Short Term Goal 2 - Progress (Week 1): Other (comment) (new goal) PT Short Term Goal 3 (Week 1): Pt will demonstrate decreased fall risk as noted by an improvement of at least 7 points on the [employer] Test PT Short Term Goal 3 - Progress (Week 1): Other (comment) (new goal) PT Short Term Goal 4 (Week 1): Pt will navigate 4 steps using HRs with no more than CGA  Skilled Therapeutic Interventions/Progress Updates:      Pt sitting in wheelchair to start - reports improvement in lower back pain due to lidocaine patches.   Sit<>Stand to RW with supervision. Patient requests to go outside for fresh air due to nice, sunny weather. Pt instructed to direct herself from CIR floor to outside - she needed min cues for direction. Had brief standing rest break in elevator and needed min cues for safety awareness while navigating the elevator threshold. She also required min cues for safety awareness while navigating throw rugs at hospital door entrances. Supervision for ambulating in hospital grounds but CGA needed while outdoors for safety. Outdoors, she ambulated on unlevel surfaces including sloped terrain, cracked pavement, and change in terrain surfaces. Seated rest break on park benches and supervision for safety approach to sit on unfamiliar benches. Pt needing min cues for directing herself back up to CIR floor, close supervision for safety while she ambulates with the RW. Pt ambulating distances > 544ft.   Pt  ended treatment sitting in wheelchair, direct handoff of care to NT, all needs met.   Therapy Documentation Precautions:  Precautions Precautions: Fall Precaution Comments: decreased stand balance- reliant on UE, Pain in LLE limits mobility at times Required Braces or Orthoses: Other Brace Other Brace: B adjustable night splints; B elbow "cosey" splints; L palm protector; R mitt Restrictions Weight Bearing Restrictions Per Provider Order: No General:     Therapy/Group: Individual Therapy  Orrin Brigham 06/18/2023, 7:52 AM

## 2023-06-18 NOTE — Progress Notes (Signed)
Occupational Therapy Session Note  Patient Details  Name: IZUMI MIXON MRN: [id number] Date of Birth: December 29, 1975  Today's Date: 06/18/2023 OT Individual Time: 1035-1100 OT Individual Time Calculation (min): 25 min    Short Term Goals: Week 8: OT Short Term Goal 1 (Week 8): Patient will put hair into ponytail with CGA OT Short Term Goal 1 - Progress (Week 8): Other (comment) OT Short Term Goal 2 (Week 8): Patient will put on bra with no more than 3 cues for technique OT Short Term Goal 2 - Progress (Week 8): Other (comment) OT Short Term Goal 3 (Week 8): [name] will complete simple meal prep with min assist OT Short Term Goal 3 - Progress (Week 8): Other (comment) OT Short Term Goal 4 (Week 8): xxx OT Short Term Goal 4 - Progress (Week 8): Other (comment) OT Short Term Goal 5 (Week 8): xxx OT Short Term Goal 5 - Progress (Week 8): Other (comment)  Skilled Therapeutic Interventions/Progress Updates:  Skilled OT intervention completed with focus on ADL retraining. Pt received seated in w/c, agreeable to session. Unrated pain reported in mid-low back; lidocaine patches already in place and pt declining other intervention. OT offered rest breaks and repositioning throughout for pain reduction.  Pt agreeable to quick wash up. Able to gather clothing seated in w/c, then seated at sink, pt required mod cues for sequencing and initiation for the following due to motor planning and coordination deficits: doffing shirt with supervision, washing UB with supervision, donning bra with min A, shirt with set up A, oral care with supervision with cues to stay on task. OT braided hair total A.  Pt remained seated in w/c, with chair alarm on/activated, telesitter activated, and with all needs in reach at end of session.   Therapy Documentation Precautions:  Precautions Precautions: Fall Precaution Comments: decreased stand balance- reliant on UE, Pain in LLE limits mobility at times Required Braces or  Orthoses: Other Brace Other Brace: B adjustable night splints; B elbow "cosey" splints; L palm protector; R mitt Restrictions Weight Bearing Restrictions Per Provider Order: No    Therapy/Group: Individual Therapy  Melvyn Novas, MS, OTR/L  06/18/2023, 11:36 AM

## 2023-06-19 MED ORDER — TRAMADOL HCL 50 MG PO TABS
50.0000 mg | ORAL_TABLET | Freq: Two times a day (BID) | ORAL | Status: DC | PRN
  Administered 2023-06-19 – 2023-07-18 (×20): 50 mg via ORAL
  Filled 2023-06-19 (×20): qty 1

## 2023-06-19 NOTE — Progress Notes (Signed)
 Physical Therapy Session Note  Patient Details  Name: ITALIA WOLFERT MRN: [id number] Date of Birth: [birth year]  Today's Date: 06/19/2023 PT Individual Time: 0802-0827 PT Individual Time Calculation (min): 25 min   Short Term Goals: Week 1:  PT Short Term Goal 1 (Week 1): Pt will perform supine<>sit with supervision PT Short Term Goal 1 - Progress (Week 1): Other (comment) (new goal) PT Short Term Goal 2 (Week 1): Pt will ambulate at least 175ft with no more than min A, not using an AD. PT Short Term Goal 2 - Progress (Week 1): Other (comment) (new goal) PT Short Term Goal 3 (Week 1): Pt will demonstrate decreased fall risk as noted by an improvement of at least 7 points on the [employer] Test PT Short Term Goal 3 - Progress (Week 1): Other (comment) (new goal) PT Short Term Goal 4 (Week 1): Pt will navigate 4 steps using HRs with no more than CGA  Skilled Therapeutic Interventions/Progress Updates:      Direct handoff of care from NT with patient positioned sitting in wheelchair at sink, finishing up oral care.   Pt denies any pain. She requests to change out of hospital socks, into personal socks and shoes. Completes this with setupA in adequate time.   Sit<>stand to RW with supervision - ambulates at supervision level to main rehab gym. Focused session on core strengthening on mat table: -30 seconds of crunches -30 seconds of reverse crunches -30 seconds of forearm/knee planks *MinA for positioning and setup. Motor apraxia inhibiting, especially with forearm planks and reverse crunches. Multiple reps/intervals completed.  Returned to her room with supervision and RW - min cues for locating room. Session concluded with her in wheelchair, all needs met.   Therapy Documentation Precautions:  Precautions Precautions: Fall Precaution Comments: decreased stand balance- reliant on UE, Pain in LLE limits mobility at times Required Braces or Orthoses: Other Brace Other Brace: B  adjustable night splints; B elbow cosey splints; L palm protector; R mitt Restrictions Weight Bearing Restrictions Per Provider Order: No General:      Therapy/Group: Individual Therapy  [name] 06/19/2023, 7:51 AM
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
## 2023-06-19 NOTE — Progress Notes (Signed)
 Occupational Therapy Session Note  Patient Details  Name: Nicole [name] MRN: [id number] Date of Birth: 1976/03/08  Today's Date: 06/19/2023 OT Individual Time: 1130-1200 OT Individual Time Calculation (min): 30 min    Short Term Goals: Week 8: OT Short Term Goal 1 (Week 8): Patient will put hair into ponytail with CGA OT Short Term Goal 1 - Progress (Week 8): Other (comment) OT Short Term Goal 2 (Week 8): Patient will put on bra with no more than 3 cues for technique OT Short Term Goal 2 - Progress (Week 8): Other (comment) OT Short Term Goal 3 (Week 8): [name] will complete simple meal prep with min assist OT Short Term Goal 3 - Progress (Week 8): Other (comment) OT Short Term Goal 4 (Week 8): xxx OT Short Term Goal 4 - Progress (Week 8): Other (comment) OT Short Term Goal 5 (Week 8): xxx OT Short Term Goal 5 - Progress (Week 8): Other (comment)  Skilled Therapeutic Interventions/Progress Updates:    1:! Pt reports significant back pain and discomfort. Pt was laying in bed when arrived resting (which is an uncommon place for her to be during the day now due to pain). Pt requested to shower today. Pt performed bed mobility with mod I to get to EOB. Pt ambulated with min A with increased forward lean and demonstrated left knee buckle with ambulation requiring more assistance without AD to get into the bathroom. Pt doffed clothing on the toilet and with increased time provided pt able to void on the toilet. Pt transitioned into the shower with min A with extra time. Pt bathed with increased assistance to wash feet and later to thread pants due to pain. Pt able to dress UE with setup with extra time. Pt ambulated back out to room and left sitting in the w/c in prep for lunch with min / mod A.  Chair alarm pad and call bell in place.   RN and MD made aware of pain limiting her today.   Therapy Documentation Precautions:  Precautions Precautions: Fall Precaution Comments: decreased stand  balance- reliant on UE, Pain in LLE limits mobility at times Required Braces or Orthoses: Other Brace Other Brace: B adjustable night splints; B elbow cosey splints; L palm protector; R mitt Restrictions Weight Bearing Restrictions Per Provider Order: No  Pain: Pain Assessment Pain Scale: Faces Faces Pain Scale: Hurts whole lot Pain Type: Acute pain Pain Location: Back Pain Orientation: Mid;Lower Pain Onset: On-going Pain Intervention(s): Shower;Rest   Therapy/Group: Individual Therapy  [name] Nest [facility] 06/19/2023, 12:02 PM

## 2023-06-19 NOTE — Progress Notes (Signed)
 Speech Language Pathology Daily Session Note  Patient Details  Name: Nicole [name] MRN: [id number] Date of Birth: 1976-04-23  Today's Date: 06/19/2023 SLP Individual Time: 1401-1430 SLP Individual Time Calculation (min): 29 min  Short Term Goals: Week 9: SLP Short Term Goal 1 (Week 9): Week 30 - Pt will solve mildly complex environmental problems with 70% accuracy given min assist. SLP Short Term Goal 2 (Week 9): Week 30 - Pt will demonstrate emergent awareness of mistakes during functional environmental tasks and correct mistakes in 3/5 opportunities given min assist. SLP Short Term Goal 3 (Week 9): Week 11 - Pt will answer basic yes/no questions with modA in 60% of opportunities SLP Short Term Goal 4 (Week 9): Week 11 - Pt will utilize increased vocal intensity w/ maxA verbal cues to remain 70% intelligibile at phrase level  Skilled Therapeutic Interventions: Skilled therapy session focused on cognitive goals. SLP faciliated session by providing minA during abstract card game. SLP verbalized instructions at beginning of session and provided minA throughout task. Patient with occasional instances of impulsivity and reduced attention requiring re-direction to task, though patient aware. At the end of the session, patient left in [facility] with alarm set and call bell in reach. Continue POC.   Pain Pain in back, medical team aware  Therapy/Group: Individual Therapy  Dilyn Osoria M.A., CF-SLP 06/19/2023, 7:48 AM

## 2023-06-20 MED ORDER — BACLOFEN 5 MG HALF TABLET: 5.0000 mg | ORAL_TABLET | Freq: Every day | ORAL | Status: DC | PRN

## 2023-06-20 NOTE — Progress Notes (Signed)
 PROGRESS NOTE   Subjective/Complaints: No new complaints this morning No issues overnight Discussed tramadol  started for his pain Hypotensive  ROS: as per HPI.Denies CP, SOB, abd pain, N/V/D/C, or any other complaints at this time.  +low back pain- variable, but managable    Objective:   No results found.    No results for input(s): WBC, HGB, HCT, PLT in the last 72 hours.          No results for input(s): NA, K, CL, CO2, GLUCOSE, BUN, CREATININE, CALCIUM  in the last 72 hours.           Intake/Output Summary (Last 24 hours) at 06/20/2023 1125 Last data filed at 06/20/2023 0809 Gross per 24 hour  Intake 480 ml  Output --  Net 480 ml           Physical Exam: Vital Signs Blood pressure (!) 91/59, pulse 74, temperature 98 F (36.7 C), temperature source Oral, resp. rate 16, height 5' 1 (1.549 m), weight 58 kg, SpO2 97%.  Constitutional: No distress . Vital signs reviewed. Resting in bed, comfortable.  HEENT: NCAT, EOMI, oral membranes moist Neck: supple Cardiovascular: RRR without murmur. No JVD    Respiratory/Chest: CTA Bilaterally without wheezes or rales. Normal effort; doubt accuracy of SpO2 reading from 0216, does not appear hypoxic, having nursing staff verify.  GI/Abdomen: BS +, non-tender, non-distended Ext: no clubbing, cyanosis, or edema Psych: pleasant and cooperative   Skin: + Scaling dried skin on bilateral heels Neurologic: alert, oriented to person, place. Fair insight Phonation and speech intelligability normal. STM deficits. Minimal hypertonicity RUE and RLE. Hypersensitivity to left hand improved, Left sided strength improved, decreased range of motion and pain at end range of motion in left shoulder, stable 2/5  Musculoskeletal:some limitations in right shoulder ROM, LB TTP     Assessment/Plan: 1. Functional deficits which require 3+ hours per day  of interdisciplinary therapy in a comprehensive inpatient rehab setting. Physiatrist is providing close team supervision and 24 hour management of active medical problems listed below. Physiatrist and rehab team continue to assess barriers to discharge/monitor patient progress toward functional and medical goals  Care Tool:  Bathing    Body parts bathed by patient: Face, Abdomen, Chest, Left upper leg, Right upper leg, Left arm, Front perineal area, Right arm, Right lower leg, Left lower leg, Buttocks   Body parts bathed by helper: Buttocks Body parts n/a: Right arm, Left arm, Front perineal area, Buttocks, Right upper leg, Left upper leg, Right lower leg, Left lower leg, Chest, Abdomen, Face   Bathing assist Assist Level: Supervision/Verbal cueing     Upper Body Dressing/Undressing Upper body dressing   What is the patient wearing?: Bra, Pull over shirt    Upper body assist Assist Level: Set up assist    Lower Body Dressing/Undressing Lower body dressing      What is the patient wearing?: Underwear/pull up, Pants     Lower body assist Assist for lower body dressing: Supervision/Verbal cueing     Toileting Toileting Toileting Activity did not occur (Clothing management and hygiene only): N/A (no void or bm)  Toileting assist Assist for toileting: Supervision/Verbal cueing  Transfers Chair/bed transfer  Transfers assist  Chair/bed transfer activity did not occur: Safety/medical concerns  Chair/bed transfer assist level: Supervision/Verbal cueing Chair/bed transfer assistive device: Armrests, Geologist, Engineering   Ambulation assist   Ambulation activity did not occur: Safety/medical concerns  Assist level: Contact Guard/Touching assist Assistive device: Walker-rolling Max distance: 300'   Walk 10 feet activity   Assist  Walk 10 feet activity did not occur: Safety/medical concerns  Assist level: Contact Guard/Touching assist Assistive  device: Walker-rolling   Walk 50 feet activity   Assist Walk 50 feet with 2 turns activity did not occur: Safety/medical concerns  Assist level: Contact Guard/Touching assist Assistive device: Walker-rolling    Walk 150 feet activity   Assist Walk 150 feet activity did not occur: Safety/medical concerns  Assist level: Contact Guard/Touching assist Assistive device: Walker-rolling    Walk 10 feet on uneven surface  activity   Assist Walk 10 feet on uneven surfaces activity did not occur: Safety/medical concerns   Assist level: Contact Guard/Touching assist Assistive device: Walker-rolling   Wheelchair     Assist Is the patient using a wheelchair?: Yes Type of Wheelchair: Manual    Wheelchair assist level: Supervision/Verbal cueing Max wheelchair distance: 164ft    Wheelchair 50 feet with 2 turns activity    Assist    Wheelchair 50 feet with 2 turns activity did not occur: Safety/medical concerns   Assist Level: Supervision/Verbal cueing   Wheelchair 150 feet activity     Assist  Wheelchair 150 feet activity did not occur: Safety/medical concerns   Assist Level: Supervision/Verbal cueing   Blood pressure (!) 91/59, pulse 74, temperature 98 F (36.7 C), temperature source Oral, resp. rate 16, height 5' 1 (1.549 m), weight 58 kg, SpO2 97%.  Medical Problem List and Plan: 1. 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 Awaiting approval of disability. SW has continued to work on placement. We also have engaged leadership within hospital for options. Code status  changed to full code after obtaining patient consent -therapies are daily only SW note reviewed and we do not have any accepting facilities thus far Discussed that we are waiting to hear from disability  2.  Impaired mobility: d/c lovenox  since ambulating >150 feet, continue SCDs, discussed that she still cannot turn safely  3. Diffuse pains: resolved, gabapentin  d/ced, lidocaine  patches for back pain  4. History of anxiety: discussed with her mom that she feels this was a big risk factor for her accident, propanolol d/ced due to hypotension. Asked SW to place her on neuropsych list, Buspar  ordered prn, continue this. Discussed placing outpatient referral for behavioral health f/u, discussed that Buspar  has been helpful for her. Decreased buspar  to daily prn, recreational therapy consulted  5. Impulsive, impaired cognition: renewed safety restraints. This patient is not capable of making decisions on her own behalf, messaged nursing to see if they feel she still needs restraints  6. Skin: eucerin cream BID -Buttock/sacrum--foam dressing, pressure relief, nutrition -stage 3 left heel, resolved  7. Fluids/Electrolytes/Nutrition: Routine in and outs with follow-up chemistries  -Eating  well with cueing   Upgraded to regular diet  -  Vit D 4000U daily      8.  Cavitary right lower lobe pneumonia likely aspiration pneumonia/MRSA pneumonia.  Resolved    Latest Ref Rng & Units 05/30/2023    5:25 AM 05/16/2023    6:28 AM 05/02/2023    5:20 AM  CBC  WBC 4.0 - 10.5 K/uL 5.3  6.2  5.9   Hemoglobin 12.0 - 15.0 g/dL 85.7  85.0  85.1   Hematocrit 36.0 - 46.0 % 41.5  42.1  43.0   Platelets 150 - 400 K/uL 223  300  241      9.  History of drug abuse.  Positive cocaine on urine drug screen.  Provide counseling  10.  AKI/hypovolemia and ATN.  Resolved -BUN/creatinine stable 1/20  -Potassium 3.9 1/20  -1/24 will check cmet on monday    Latest Ref Rng & Units 05/30/2023    5:25 AM 05/16/2023     6:28 AM 05/02/2023    5:20 AM  BMP  Glucose 70 - 99 mg/dL 87  87  89   BUN 6 - 20 mg/dL 10  15  14    Creatinine 0.44 - 1.00 mg/dL 9.31  9.24  9.20   Sodium 135 - 145 mmol/L 137  136  138   Potassium 3.5 - 5.1 mmol/L 3.9  3.9  4.0   Chloride 98 - 111 mmol/L 107  105  109   CO2 22 - 32 mmol/L 25  22  23    Calcium  8.9 - 10.3 mg/dL 8.6  8.9  8.9     11.  Mild transaminitis with rhabdomyolysis.  Both resolved  12.  Hypotension:  D/c flomax , continue to monitor BP TID -2/1-2/25 BP fine/stable, cont monitoring  Vitals:   06/16/23 1342 06/16/23 1944 06/17/23 0216 06/17/23 1326  BP: 99/66 (!) 98/46 105/80 103/72   06/17/23 1926 06/18/23 0415 06/18/23 1330 06/18/23 2019  BP: 98/67 101/60 109/82 104/71   06/19/23 0543 06/19/23 1306 06/19/23 1958 06/20/23 0414  BP: 102/69 107/63 105/70 (!) 91/59     13. HTN: resolved  14. Impaired initiation: resolved, d/c amantadine   -Consider restart amantadine -- restarted 100mg  daily  15. Hyperglycemia: CBGs mildly elevated, d/ced checks. Prosource d/ced as per patient's request  16. Spasticity:   off baclofen  and tizanidine - much improved  17.  Dyskinetic movements in head and neck -  -  resolved   18. Cervical extensor weakness: improved, continue strengthening exercises  19. Bowel and bladder incontinence: continue bowel and bladder program. Flomax  started for urgency-- d/c'd  -06/17/23 LBM yesterday  20. Fatigue: resolved, d/c b complex   21. MRSA nares positive: negative on repeat, precautions d/ced  22. Tachycardic: resolved  23. S/p fall: CT head reviewed and is stable  24. Bilateral foot drop: bilateral AFOs ordered, discussed that these can help her to walk better, continue  25. Vaginal itching: resolved  26. Dysuria resolved  27. Poor sitting balance: continue OT/PT, improving  28. Headaches: topamax  1m daily prn ordered, will d/c since not requiring  29. Drowsiness with tramadol : d/ced  30. IUD in place: Pt states  she has IUD x68yrs  and believes it's supposed to be removed soon(chart review- Dr. Lavoie placed Mirena  IUD on 07/01/19, good for 51yrs); needs outpatient follow-up with OB/GYN  31. Constipation: d/c baclofen , last BM 12/18, milk of magnesia ordered 12/20  -LBM 1/27, restart magnesium  gluconate 250mg  HS  Continue current regimen  32. History of drug addiction: tramadol  d/ced as per mom's preference  33. Depression: discussed  scheduling for outpatient psychology/psychiatry follow-up, discussed neuropsych f/u while in hospital  -appreciate Dr. Darden follow up  34. Sinusitis: claritin  ordered prn, will d/c since not requiring  35. Pruritus: sarna lotion ordered, discussed that this is prn, will d/c since not requiring  26. Low back pain: XR ordered and discussed that it is negative, continue lidocaine  patch, tramadol  added prn  27. Polypharmacy: amantadine  d/ced since no changes noted with it, continue current medications  28. Insomnia: change melatonin to prn, continue  LOS: 214 days A FACE TO FACE EVALUATION WAS PERFORMED  Cartel Mauss P Meer Reindl 06/20/2023, 11:25 AM

## 2023-06-20 NOTE — Progress Notes (Signed)
 Physical Therapy Session Note  Patient Details  Name: Nicole [name] MRN: [id number] Date of Birth: 1976/03/29  Today's Date: 06/20/2023 PT Individual Time: 8865-8840 PT Individual Time Calculation (min): 25 min   Short Term Goals: Week 1:  PT Short Term Goal 1 (Week 1): Pt will perform supine<>sit with supervision PT Short Term Goal 1 - Progress (Week 1): Other (comment) (new goal) PT Short Term Goal 2 (Week 1): Pt will ambulate at least 116ft with no more than min A, not using an AD. PT Short Term Goal 2 - Progress (Week 1): Other (comment) (new goal) PT Short Term Goal 3 (Week 1): Pt will demonstrate decreased fall risk as noted by an improvement of at least 7 points on the [employer] Test PT Short Term Goal 3 - Progress (Week 1): Other (comment) (new goal) PT Short Term Goal 4 (Week 1): Pt will navigate 4 steps using HRs with no more than CGA  Skilled Therapeutic Interventions/Progress Updates:      Pt sitting EOB to start - bed alarm on. Pt reports improvement in her lower back pain, doesn't complain of any during treatment.   Sit<>stand to RW with supervision. Ambulates with supervision and RW into day room gym, ~182ft.   Pt assisted into supine position on mat table. Worked on stretching into extension using medium sized physioball. Used supine chest stretch on physioball with minA for positioning and minA for stability. Pt maintaining a bridged position for stretching on phyisoball. Min cues for letting her neck relax into extension as well. Pt reporting improvement in back tightness after stretching.  Returned to her room at end of treatment - pt requesting to return bed. Left with all needs met and bed alarm on.   Therapy Documentation Precautions:  Precautions Precautions: Fall Precaution Comments: decreased stand balance- reliant on UE, Pain in LLE limits mobility at times Required Braces or Orthoses: Other Brace Other Brace: B adjustable night splints; B elbow cosey  splints; L palm protector; R mitt Restrictions Weight Bearing Restrictions Per Provider Order: No General:     Therapy/Group: Individual Therapy  [name] 06/20/2023, 12:31 PM

## 2023-06-20 NOTE — Patient Care Conference (Signed)
 Inpatient RehabilitationTeam Conference and Plan of Care Update Date: 06/20/2023   Time: 11:27 AM    Patient Name: Nicole [name]      Medical Record Number: [id number]  Date of Birth: November 13, 1975 Sex: Female         Room/Bed: 4W09C/4W09C-01 Payor Info: Payor: [employer] / Plan: [employer] / Product Type: *No Product type* /    Admit Date/Time:  11/18/2022  3:15 PM  Primary Diagnosis:  Hypoxic brain injury [facility])  Hospital Problems: Principal Problem:   Hypoxic brain injury (HCC) Active Problems:   Protein-calorie malnutrition, severe   Essential hypertension   Pressure injury of skin   History of cocaine abuse (HCC)   Anxiety with depression    Expected Discharge Date: Expected Discharge Date:  (SNF pending)  Team Members Present: Physician leading conference: Dr. Sven Elks Social Worker Present: Rhoda Clement, LCSW Nurse Present: Barnie Ronde, RN;Angelina Sula, RN PT Present: Sherlean Perks, PT OT Present: Monica Peacock, OT SLP Present: Rosina Downy, SLP     Current Status/Progress Goal Weekly Team Focus  Bowel/Bladder      Incontinent of bladder    Bowel and bladder incontinence managed w min assist    Toileting protocol  Swallow/Nutrition/ Hydration   reg/thin           ADL's   Intermittent min assist   goals supervision - contact guard   back pain, selective attention, problem ID/ Solving, functional use of left limbs    Mobility   No change - CGA to supervision for functional mobility   Supervision/CGA  General mobility, safety awareness, dual-cog tasks    Communication   vocal intensity across variety of settings and unfamilar listeners   Mod I   carry over of strategies across settings    Safety/Cognition/ Behavioral Observations  min-mod A   sup to min A   functional problem solving    Pain      Back pain managed with prn medications    Pain < 4 with prns    Assess need for and effectiveness of pain medication   Skin      Healing wound on buttock and left foot   Wound on buttock and left foot healing   Assess skin q shift and effectiveness of skin care/wound treatment plan and adjust as needed     Discharge Planning:  Continue to await on disablity determination and then will have payment source for ALF VS ICF bed.   Team Discussion: Patient admitted post hypoxic brain injury. Patient continues to demonstrate slow improvement in function and is limited by selective attention, problem solving deficits poor carry over of strategies which impairs her ability to manage dual cognitive tasks.  Patient on target to meet rehab goals: yes, currently needs intermittent min assist for ADLs. Patient able to ambulate short distances in a straight line; unable to manage balance with turns.   *See Care Plan and progress notes for long and short-term goals.   Revisions to Treatment Plan:  N/A; continue Q day therapies   Teaching Needs: Safety, medications, skin care, transfers, toileting, etc.   Current Barriers to Discharge: Incontinence, Wound care, Lack of/limited family support, and Insurance for SNF coverage  Possible Resolutions to Barriers: SNF pending Disability pending; currently in Physician review     Medical Summary Current Status: low back pain, anxiety,  Barriers to Discharge: Medical stability  Barriers to Discharge Comments: low back pain, anxiety Possible Resolutions to [employer] Focus: tramadol  added, continue  buspar , continue magnesium    Continued Need for Acute Rehabilitation Level of Care: The patient requires daily medical management by a physician with specialized training in physical medicine and rehabilitation for the following reasons: Direction of a multidisciplinary physical rehabilitation program to maximize functional independence : Yes Medical management of patient stability for increased activity during participation in an intensive rehabilitation regime.:  Yes Analysis of laboratory values and/or radiology reports with any subsequent need for medication adjustment and/or medical intervention. : Yes   I attest that I was present, lead the team conference, and concur with the assessment and plan of the team.   Fredericka Sober B 06/20/2023, 3:09 PM

## 2023-06-20 NOTE — Progress Notes (Signed)
 Occupational Therapy Weekly Progress Note  Patient Details  Name: Nicole [name] MRN: [id number] Date of Birth: May 02, 1976  Beginning of progress report period: June 07, 2023 End of progress report period: June 20, 2023  Today's Date: 06/20/2023 OT Individual Time: 9054-8984 OT Individual Time Calculation (min): 30 min    Patient has met 1 of 3 short term goals.  Patient is receiving limited therapy, therefore have not had opportunity to fully address all three goals.  Patient continues with steady functional improvement yet continues to require intermittent min assist for BADL  Patient continues to demonstrate the following deficits: muscle weakness and muscle joint tightness, impaired timing and sequencing, abnormal tone, unbalanced muscle activation, motor apraxia, decreased coordination, and decreased motor planning, decreased attention to left, decreased initiation, decreased attention, decreased awareness, decreased problem solving, decreased safety awareness, decreased memory, and delayed processing, and decreased sitting balance, decreased standing balance, decreased postural control, hemiplegia, and decreased balance strategies and therefore will continue to benefit from skilled OT intervention to enhance overall performance with BADL and Reduce care partner burden.  Patient progressing toward long term goals..  Continue plan of care.  OT Short Term Goals Week 8: OT Short Term Goal 1 (Week 8): Patient will put hair into ponytail with CGA OT Short Term Goal 1 - Progress (Week 8): Met OT Short Term Goal 2 (Week 8): Patient will put on bra with no more than 3 cues for technique OT Short Term Goal 2 - Progress (Week 8): Progressing toward goal OT Short Term Goal 3 (Week 8): Patient will complete simple meal prep with min assist OT Short Term Goal 3 - Progress (Week 8): Not met OT Short Term Goal 4 (Week 8): xxx OT Short Term Goal 4 - Progress (Week 8): Other (comment) OT Short Term  Goal 5 (Week 8): xxx OT Short Term Goal 5 - Progress (Week 8): Other (comment) Week 9: OT Short Term Goal 1 (Week 9): Patient will walk to bathroom without device and close supervision OT Short Term Goal 1 - Progress (Week 9): Other (comment) OT Short Term Goal 2 (Week 9): Patient will don bra with no more than 3 verbal cues OT Short Term Goal 2 - Progress (Week 9): Other (comment) OT Short Term Goal 3 (Week 9): Patient will complete simple meal prep with min assist OT Short Term Goal 3 - Progress (Week 9): Other (comment) OT Short Term Goal 4 (Week 9): xxx OT Short Term Goal 4 - Progress (Week 9): Other (comment) OT Short Term Goal 5 (Week 9): xxx OT Short Term Goal 5 - Progress (Week 9): Other (comment)  Skilled Therapeutic Interventions/Progress Updates:   Patient received supine in bed eager for shower.  Walked to bathroom with contact guard and no device.  Patient sat herself on toilet to remove lower body clothing.  Patient needs cueing to remove socks, and to remove socks while seated versus standing as she experienced two losses of balance and continued to attempt unsafe task.  Able to shower with supervision, needing intermittent cueing for completeness.  Patient able to don all of her clothing today with the exception of her bra, which she could not organize without help.  Stood at sink for hair and oral care.  Left sitting up at edge of bed with alarm engaged and telesitter in place.  Personal items in reach.    Therapy Documentation Precautions:  Precautions Precautions: Fall Precaution Comments: decreased stand balance- reliant on UE, Pain in LLE limits mobility  at times Required Braces or Orthoses: Other Brace Other Brace: B adjustable night splints; B elbow cosey splints; L palm protector; R mitt Restrictions Weight Bearing Restrictions Per Provider Order: No   Pain: Pain Assessment Pain Scale: 0-10 Pain Score: 0-No pain     Therapy/Group: Individual  Therapy  Eriyana Sweeten M 06/20/2023, 12:28 PM

## 2023-06-21 NOTE — Group Note (Addendum)
 Patient Details Name: Nicole [name] MRN: [id number] DOB: 05/08/1976 Today's Date: 06/21/2023  Goal:  Pt will actively participate in dance group for 60 minutes with supervision.  MET  Group Description: Dance Group: Pt participated in dance group with an emphasis on social interaction, motor planning, increasing overall activity tolerance and bimanual tasks. All songs were selected by group members. Dance moves included AROM of BUE/BLE gross motor movements with an emphasis on building functional endurance.    Individual level documentation: Patient completed group from sitting level. Patient needed supervision to complete various dance moves with demonstration cues. Patient needed & initiated minimal modifications throughout group with supervision.  Pt talking and laughing with other participants throughout the group.  Pain:no c/o     Gurdeep Keesey 06/21/2023, 4:06 PM

## 2023-06-21 NOTE — Progress Notes (Signed)
 PROGRESS NOTE   Subjective/Complaints: No new complaints this morning Says she has not yet tried tramadol  for her back pain but looks like it was administered on 2/4  ROS: as per HPI. Denies CP, SOB, abd pain, N/V/D/C, or any other complaints at this time.  +low back pain- variable, but managable    Objective:   No results found.    No results for input(s): WBC, HGB, HCT, PLT in the last 72 hours.          No results for input(s): NA, K, CL, CO2, GLUCOSE, BUN, CREATININE, CALCIUM  in the last 72 hours.           Intake/Output Summary (Last 24 hours) at 06/21/2023 1010 Last data filed at 06/21/2023 9187 Gross per 24 hour  Intake 476 ml  Output --  Net 476 ml           Physical Exam: Vital Signs Blood pressure 95/70, pulse 85, temperature 97.8 F (36.6 C), temperature source Oral, resp. rate 16, height 5' 1 (1.549 m), weight 58 kg, SpO2 96%.  Constitutional: No distress . Vital signs reviewed. Resting in bed, comfortable.  HEENT: NCAT, EOMI, oral membranes moist Neck: supple Cardiovascular: RRR without murmur. No JVD    Respiratory/Chest: CTA Bilaterally without wheezes or rales. Normal effort; doubt accuracy of SpO2 reading from 0216, does not appear hypoxic, having nursing staff verify.  GI/Abdomen: BS +, non-tender, non-distended Ext: no clubbing, cyanosis, or edema Psych: pleasant and cooperative   Skin: + Scaling dried skin on bilateral heels Neurologic: alert, oriented to person, place. Fair insight Phonation and speech intelligability normal. STM deficits. Minimal hypertonicity RUE and RLE. Hypersensitivity to left hand improved, Left sided strength improved, decreased range of motion and pain at end range of motion in left shoulder, stable 2/6  Musculoskeletal:some limitations in right shoulder ROM, LB TTP     Assessment/Plan: 1. Functional deficits which  require 3+ hours per day of interdisciplinary therapy in a comprehensive inpatient rehab setting. Physiatrist is providing close team supervision and 24 hour management of active medical problems listed below. Physiatrist and rehab team continue to assess barriers to discharge/monitor patient progress toward functional and medical goals  Care Tool:  Bathing    Body parts bathed by patient: Face, Abdomen, Chest, Left upper leg, Right upper leg, Left arm, Front perineal area, Right arm, Right lower leg, Left lower leg, Buttocks   Body parts bathed by helper: Buttocks Body parts n/a: Right arm, Left arm, Front perineal area, Buttocks, Right upper leg, Left upper leg, Right lower leg, Left lower leg, Chest, Abdomen, Face   Bathing assist Assist Level: Supervision/Verbal cueing     Upper Body Dressing/Undressing Upper body dressing   What is the patient wearing?: Bra, Pull over shirt    Upper body assist Assist Level: Minimal Assistance - Patient > 75%    Lower Body Dressing/Undressing Lower body dressing      What is the patient wearing?: Underwear/pull up, Pants     Lower body assist Assist for lower body dressing: Supervision/Verbal cueing     Toileting Toileting Toileting Activity did not occur (Clothing management and hygiene only): N/A (no void or bm)  Toileting assist Assist for toileting: Supervision/Verbal cueing     Transfers Chair/bed transfer  Transfers assist  Chair/bed transfer activity did not occur: Safety/medical concerns  Chair/bed transfer assist level: Supervision/Verbal cueing Chair/bed transfer assistive device: Armrests, Geologist, Engineering   Ambulation assist   Ambulation activity did not occur: Safety/medical concerns  Assist level: Contact Guard/Touching assist Assistive device: Walker-rolling Max distance: 300'   Walk 10 feet activity   Assist  Walk 10 feet activity did not occur: Safety/medical concerns  Assist level:  Contact Guard/Touching assist Assistive device: Walker-rolling   Walk 50 feet activity   Assist Walk 50 feet with 2 turns activity did not occur: Safety/medical concerns  Assist level: Contact Guard/Touching assist Assistive device: Walker-rolling    Walk 150 feet activity   Assist Walk 150 feet activity did not occur: Safety/medical concerns  Assist level: Contact Guard/Touching assist Assistive device: Walker-rolling    Walk 10 feet on uneven surface  activity   Assist Walk 10 feet on uneven surfaces activity did not occur: Safety/medical concerns   Assist level: Contact Guard/Touching assist Assistive device: Walker-rolling   Wheelchair     Assist Is the patient using a wheelchair?: Yes Type of Wheelchair: Manual    Wheelchair assist level: Supervision/Verbal cueing Max wheelchair distance: 123ft    Wheelchair 50 feet with 2 turns activity    Assist    Wheelchair 50 feet with 2 turns activity did not occur: Safety/medical concerns   Assist Level: Supervision/Verbal cueing   Wheelchair 150 feet activity     Assist  Wheelchair 150 feet activity did not occur: Safety/medical concerns   Assist Level: Supervision/Verbal cueing   Blood pressure 95/70, pulse 85, temperature 97.8 F (36.6 C), temperature source Oral, resp. rate 16, height 5' 1 (1.549 m), weight 58 kg, SpO2 96%.  Medical Problem List and Plan: 1. 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 Awaiting approval of disability. SW has continued to work on placement. We also have engaged leadership  within hospital for options. Code status changed to full code after obtaining patient consent -therapies are daily only SW note reviewed and we do not have any accepting facilities thus far Discussed that we are waiting to hear from disability  2.  Impaired mobility: d/c lovenox  since ambulating >150 feet, continue SCDs, discussed that she still cannot turn safely  3. Diffuse pains: resolved, gabapentin  d/ced, lidocaine  patches for back pain  4. History of anxiety: discussed with her mom that she feels this was a big risk factor for her accident, propanolol d/ced due to hypotension. Asked SW to place her on neuropsych list, Buspar  ordered prn, continue this. Discussed placing outpatient referral for behavioral health f/u, discussed that Buspar  has been helpful for her. Decreased buspar  to daily prn, recreational therapy consulted  5. Impulsive, impaired cognition: renewed safety restraints. This patient is not capable of making decisions on her own behalf, messaged nursing to see if they feel she still needs restraints  6. Skin: eucerin cream BID -Buttock/sacrum--foam dressing, pressure relief, nutrition -stage 3 left heel, resolved  7. Fluids/Electrolytes/Nutrition: Routine in and outs with follow-up chemistries  -Eating  well with cueing   Upgraded to regular diet  -Vit D 4000U daily      8.  Cavitary right lower lobe pneumonia likely aspiration pneumonia/MRSA pneumonia.  Resolved    Latest Ref Rng & Units 05/30/2023    5:25 AM 05/16/2023    6:28 AM 05/02/2023    5:20 AM  CBC  WBC 4.0 - 10.5 K/uL 5.3  6.2  5.9   Hemoglobin 12.0 - 15.0 g/dL 85.7  85.0  85.1   Hematocrit 36.0 - 46.0 % 41.5  42.1  43.0   Platelets 150 - 400 K/uL 223  300  241      9.  History of drug abuse.  Positive cocaine on urine drug screen.  Provide counseling  10.  AKI/hypovolemia and ATN.  Resolved -BUN/creatinine stable 1/20  -Potassium 3.9 1/20  -1/24 will check cmet on monday    Latest Ref Rng &  Units 05/30/2023    5:25 AM 05/16/2023    6:28 AM 05/02/2023    5:20 AM  BMP  Glucose 70 - 99 mg/dL 87  87  89   BUN 6 - 20 mg/dL 10  15  14    Creatinine 0.44 - 1.00 mg/dL 9.31  9.24  9.20   Sodium 135 - 145 mmol/L 137  136  138   Potassium 3.5 - 5.1 mmol/L 3.9  3.9  4.0   Chloride 98 - 111 mmol/L 107  105  109   CO2 22 - 32 mmol/L 25  22  23    Calcium  8.9 - 10.3 mg/dL 8.6  8.9  8.9     11.  Mild transaminitis with rhabdomyolysis.  Both resolved  12.  Hypotension:  D/c flomax , continue to monitor BP TID -2/1-2/25 BP fine/stable, cont monitoring  Vitals:   06/17/23 1326 06/17/23 1926 06/18/23 0415 06/18/23 1330  BP: 103/72 98/67 101/60 109/82   06/18/23 2019 06/19/23 0543 06/19/23 1306 06/19/23 1958  BP: 104/71 102/69 107/63 105/70   06/20/23 0414 06/20/23 1553 06/20/23 2040 06/21/23 0505  BP: (!) 91/59 107/89 (!) 90/57 95/70     13. HTN: resolved  14. Impaired initiation: resolved, d/c amantadine   -Consider restart amantadine -- restarted 100mg  daily  15. Hyperglycemia: CBGs mildly elevated, d/ced checks. Prosource d/ced as per patient's request  16. Spasticity:   off baclofen  and tizanidine - much improved  17.  Dyskinetic movements in head and neck -  -  resolved   18. Cervical extensor weakness: improved, continue strengthening exercises  19. Bowel and bladder incontinence: continue bowel and bladder program. Flomax  started for urgency-- d/c'd  -06/17/23 LBM yesterday  20. Fatigue: resolved, d/c b complex   21. MRSA nares positive: negative on repeat, precautions d/ced  22. Tachycardic: resolved  23. S/p fall: CT head reviewed and is stable  24. Bilateral foot drop: bilateral AFOs ordered, discussed that these can help her to walk better, continue  25. Vaginal itching: resolved  26. Dysuria resolved  27. Poor sitting balance: continue OT/PT, improving  28. Headaches: topamax  71m daily prn ordered, will d/c since not requiring  29. Drowsiness with  tramadol : d/ced  30. IUD in place: Pt states she has IUD x38yrs  and believes it's supposed to be removed soon(chart review- Dr. Lavoie placed Mirena  IUD on 07/01/19, good for 58yrs); needs outpatient follow-up with OB/GYN  31. Constipation: d/c baclofen , last BM 12/18, milk of magnesia ordered 12/20  -LBM 1/27, restart magnesium  gluconate 250mg  HS  Continue current regimen  32. History of drug addiction:  tramadol  d/ced as per mom's preference  33. Depression: discussed scheduling for outpatient psychology/psychiatry follow-up, discussed neuropsych f/u while in hospital  -appreciate Dr. Darden follow up  34. Sinusitis: claritin  ordered prn, will d/c since not requiring  35. Pruritus: sarna lotion ordered, discussed that this is prn, will d/c since not requiring  26. Low back pain: XR ordered and discussed that it is negative, continue lidocaine  patch, tramadol  added prn, advised that she will have to ask for medication if she feels that she needs it  27. Polypharmacy: amantadine  d/ced since no changes noted with it, continuecurrent medications  28. Insomnia: change melatonin to prn, continue  29. Muscle spasms: baclofen  ordered prn  LOS: 215 days A FACE TO FACE EVALUATION WAS PERFORMED  Joliana Claflin P Aydan Levitz 06/21/2023, 10:10 AM

## 2023-06-21 NOTE — Progress Notes (Signed)
 Patient ID: Nicole [name], female   DOB: [birth year], 48 y.o.   MRN: [id number]  Contacted [employer] Case worker to check on status of disability still with disability MD. No determenation yet. Team conference yesterday and continues to be at a CGA level of assist.

## 2023-06-21 NOTE — Progress Notes (Signed)
 Occupational Therapy Session Note  Patient Details  Name: Nicole [name] MRN: [id number] Date of Birth: Feb 09, 1976  Today's Date: 06/21/2023 OT Individual Time: 1045-       Short Term Goals: Week 9: OT Short Term Goal 1 (Week 9): Patient will walk to bathroom without device and close supervision OT Short Term Goal 1 - Progress (Week 9): Other (comment) OT Short Term Goal 2 (Week 9): Patient will don bra with no more than 3 verbal cues OT Short Term Goal 2 - Progress (Week 9): Other (comment) OT Short Term Goal 3 (Week 9): Patient will complete simple meal prep with min assist OT Short Term Goal 3 - Progress (Week 9): Other (comment) OT Short Term Goal 4 (Week 9): xxx OT Short Term Goal 4 - Progress (Week 9): Other (comment) OT Short Term Goal 5 (Week 9): xxx OT Short Term Goal 5 - Progress (Week 9): Other (comment)  Skilled Therapeutic Interventions/Progress Updates:   Patient received seated in wheelchair and requesting to shower. Walked without device to shower with close supervision and cueing.  Patient able to indicate need to void and had continent void on toilet.  Showered and dressed herself with intermittent min assist.  Patient left up in wheelchair at end of session with safety belt in place and engaged.    Therapy Documentation Precautions:  Precautions Precautions: Fall Precaution Comments: decreased stand balance- reliant on UE, Pain in LLE limits mobility at times Required Braces or Orthoses: Other Brace Other Brace: B adjustable night splints; B elbow cosey splints; L palm protector; R mitt Restrictions Weight Bearing Restrictions Per Provider Order: No   Pain: Pain Assessment Pain Scale: 0-10 Pain Score: 0 Pain Type: Chronic pain Pain Location: Back Pain Intervention(s): Medication (See eMAR)       Therapy/Group: Individual Therapy  Roxene Alviar M 06/21/2023, 10:51 AM

## 2023-06-21 NOTE — Progress Notes (Signed)
 Physical Therapy Session Note  Patient Details  Name: Nicole [name] MRN: [id number] Date of Birth: 28-Jul-1975  Today's Date: 06/21/2023 PT Individual Time: 8665-8571 PT Individual Time Calculation (min): 54 min   Short Term Goals: Week 1:  PT Short Term Goal 1 (Week 1): Pt will perform supine<>sit with supervision PT Short Term Goal 1 - Progress (Week 1): Other (comment) (new goal) PT Short Term Goal 2 (Week 1): Pt will ambulate at least 151ft with no more than min A, not using an AD. PT Short Term Goal 2 - Progress (Week 1): Other (comment) (new goal) PT Short Term Goal 3 (Week 1): Pt will demonstrate decreased fall risk as noted by an improvement of at least 7 points on the [employer] Test PT Short Term Goal 3 - Progress (Week 1): Other (comment) (new goal) PT Short Term Goal 4 (Week 1): Pt will navigate 4 steps using HRs with no more than CGA  Skilled Therapeutic Interventions/Progress Updates:      Pt sitting in wheelchair to start - no reports of pain.   Wheeled herself at supervision level using BUE 13ft to main rehab gym. Able to navigate obstacles and gym equipment appropriately. Stand pivot transfer onto mat table with CGA.   Worked on chest stretches while lying supine on physioball - PT assisting with positioning and setup. Encouraged BUE abduction, neck extension, and thoracic extension for stretching.   Next, worked on AUTOMATIC DATA for sitting balance, core activiation, and righting reactions. Assisted into sitting on large physioball with minA. Physioball placed in corner to provide safety. Pt needing CGA for static sitting balance. Added dynamic activities with red theraband, lifting legs, arm raises, etc. Pt with insufficient balance recovery strategies and with her poor sustained attention, she lost her balance quite frequently.  Addressed floor transfers to assess recall of strategies and carryover with functional training. Patient completed floor transfers x3 times at [facility]  level. For the first time, she had x1 LARGE LOB that needed maxA for recovery to prevent forward LOB. The other x2 times she completed without LOB and min cues for safety and pacing. Time for each <30 seconds.   Direct handoff of care to TR for patient to participate in Dance Group. All needs met.   Therapy Documentation Precautions:  Precautions Precautions: Fall Precaution Comments: decreased stand balance- reliant on UE, Pain in LLE limits mobility at times Required Braces or Orthoses: Other Brace Other Brace: B adjustable night splints; B elbow cosey splints; L palm protector; R mitt Restrictions Weight Bearing Restrictions Per Provider Order: No General:     Therapy/Group: Individual Therapy  Calbert Hulsebus P Lowen Mansouri 06/21/2023, 7:50 AM

## 2023-06-22 MED ORDER — BACLOFEN 5 MG HALF TABLET
5.0000 mg | ORAL_TABLET | Freq: Every day | ORAL | Status: DC
  Administered 2023-06-22 – 2023-07-04 (×13): 5 mg via ORAL
  Filled 2023-06-22 (×14): qty 1

## 2023-06-22 NOTE — Progress Notes (Signed)
 PROGRESS NOTE   Subjective/Complaints: Feels that muscle spasms are worse at night, asked if she would like baclofen  scheduled at night and she would, scheduled  ROS: as per HPI. Denies CP, SOB, abd pain, N/V/D/C, or any other complaints at this time.  +low back pain- variable, but managable, +muscle spasms    Objective:   No results found.    No results for input(s): WBC, HGB, HCT, PLT in the last 72 hours.          No results for input(s): NA, K, CL, CO2, GLUCOSE, BUN, CREATININE, CALCIUM  in the last 72 hours.           Intake/Output Summary (Last 24 hours) at 06/22/2023 1029 Last data filed at 06/21/2023 1328 Gross per 24 hour  Intake 236 ml  Output --  Net 236 ml           Physical Exam: Vital Signs Blood pressure 109/70, pulse 72, temperature 98.2 F (36.8 C), resp. rate 17, height 5' 1 (1.549 m), weight 58 kg, SpO2 97%.  Constitutional: No distress . Vital signs reviewed. Resting in bed, comfortable.  HEENT: NCAT, EOMI, oral membranes moist Neck: supple Cardiovascular: RRR without murmur. No JVD    Respiratory/Chest: CTA Bilaterally without wheezes or rales. Normal effort; doubt accuracy of SpO2 reading from 0216, does not appear hypoxic, having nursing staff verify.  GI/Abdomen: BS +, non-tender, non-distended Ext: no clubbing, cyanosis, or edema Psych: pleasant and cooperative   Skin: + Scaling dried skin on bilateral heels Neurologic: alert, oriented to person, place. Fair insight Phonation and speech intelligability normal. STM deficits. Minimal hypertonicity RUE and RLE. Hypersensitivity to left hand improved, Left sided strength improved, decreased range of motion and pain at end range of motion in left shoulder, stable 2/7  Musculoskeletal:some limitations in right shoulder ROM, LB TTP     Assessment/Plan: 1. Functional deficits which require 3+ hours  per day of interdisciplinary therapy in a comprehensive inpatient rehab setting. Physiatrist is providing close team supervision and 24 hour management of active medical problems listed below. Physiatrist and rehab team continue to assess barriers to discharge/monitor patient progress toward functional and medical goals  Care Tool:  Bathing    Body parts bathed by patient: Face, Abdomen, Chest, Left upper leg, Right upper leg, Left arm, Front perineal area, Right arm, Right lower leg, Left lower leg, Buttocks   Body parts bathed by helper: Buttocks Body parts n/a: Right arm, Left arm, Front perineal area, Buttocks, Right upper leg, Left upper leg, Right lower leg, Left lower leg, Chest, Abdomen, Face   Bathing assist Assist Level: Supervision/Verbal cueing     Upper Body Dressing/Undressing Upper body dressing   What is the patient wearing?: Bra, Pull over shirt    Upper body assist Assist Level: Minimal Assistance - Patient > 75%    Lower Body Dressing/Undressing Lower body dressing      What is the patient wearing?: Underwear/pull up, Pants     Lower body assist Assist for lower body dressing: Supervision/Verbal cueing     Toileting Toileting Toileting Activity did not occur (Clothing management and hygiene only): N/A (no void or bm)  Toileting assist Assist  for toileting: Supervision/Verbal cueing     Transfers Chair/bed transfer  Transfers assist  Chair/bed transfer activity did not occur: Safety/medical concerns  Chair/bed transfer assist level: Supervision/Verbal cueing Chair/bed transfer assistive device: Armrests, Geologist, Engineering   Ambulation assist   Ambulation activity did not occur: Safety/medical concerns  Assist level: Contact Guard/Touching assist Assistive device: Walker-rolling Max distance: 300'   Walk 10 feet activity   Assist  Walk 10 feet activity did not occur: Safety/medical concerns  Assist level: Contact  Guard/Touching assist Assistive device: Walker-rolling   Walk 50 feet activity   Assist Walk 50 feet with 2 turns activity did not occur: Safety/medical concerns  Assist level: Contact Guard/Touching assist Assistive device: Walker-rolling    Walk 150 feet activity   Assist Walk 150 feet activity did not occur: Safety/medical concerns  Assist level: Contact Guard/Touching assist Assistive device: Walker-rolling    Walk 10 feet on uneven surface  activity   Assist Walk 10 feet on uneven surfaces activity did not occur: Safety/medical concerns   Assist level: Contact Guard/Touching assist Assistive device: Walker-rolling   Wheelchair     Assist Is the patient using a wheelchair?: Yes Type of Wheelchair: Manual    Wheelchair assist level: Supervision/Verbal cueing Max wheelchair distance: 164ft    Wheelchair 50 feet with 2 turns activity    Assist    Wheelchair 50 feet with 2 turns activity did not occur: Safety/medical concerns   Assist Level: Supervision/Verbal cueing   Wheelchair 150 feet activity     Assist  Wheelchair 150 feet activity did not occur: Safety/medical concerns   Assist Level: Supervision/Verbal cueing   Blood pressure 109/70, pulse 72, temperature 98.2 F (36.8 C), resp. rate 17, height 5' 1 (1.549 m), weight 58 kg, SpO2 97%.  Medical Problem List and Plan: 1. 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 Awaiting approval of disability. SW has continued to work on placement. We also have engaged leadership within hospital for options. Code  status changed to full code after obtaining patient consent -therapies are daily only SW note reviewed and we do not have any accepting facilities thus far Discussed that we are waiting to hear from disability  2.  Impaired mobility: d/c lovenox  since ambulating >150 feet, continue SCDs, discussed that she still cannot turn safely  3. Diffuse pains: resolved, gabapentin  d/ced, lidocaine  patches for back pain  4. History of anxiety: discussed with her mom that she feels this was a big risk factor for her accident, propanolol d/ced due to hypotension. Asked SW to place her on neuropsych list, Buspar  ordered prn, continue this. Discussed placing outpatient referral for behavioral health f/u, discussed that Buspar  has been helpful for her. Decreased buspar  to daily prn, recreational therapy consulted  5. Impulsive, impaired cognition: renewed safety restraints. This patient is not capable of making decisions on her own behalf, messaged nursing to see if they feel she still needs restraints  6. Skin: eucerin cream BID -Buttock/sacrum--foam dressing, pressure relief, nutrition -stage 3 left heel, resolved  7. Fluids/Electrolytes/Nutrition: Routine in and outs with follow-up chemistries  -Eating  well with cueing   Upgraded  to regular diet  -Vit D 4000U daily      8.  Cavitary right lower lobe pneumonia likely aspiration pneumonia/MRSA pneumonia.  Resolved    Latest Ref Rng & Units 05/30/2023    5:25 AM 05/16/2023    6:28 AM 05/02/2023    5:20 AM  CBC  WBC 4.0 - 10.5 K/uL 5.3  6.2  5.9   Hemoglobin 12.0 - 15.0 g/dL 85.7  85.0  85.1   Hematocrit 36.0 - 46.0 % 41.5  42.1  43.0   Platelets 150 - 400 K/uL 223  300  241      9.  History of drug abuse.  Positive cocaine on urine drug screen.  Provide counseling  10.  AKI/hypovolemia and ATN.  Resolved -BUN/creatinine stable 1/20  -Potassium 3.9 1/20  -1/24 will check cmet on monday    Latest Ref Rng & Units 05/30/2023    5:25 AM 05/16/2023     6:28 AM 05/02/2023    5:20 AM  BMP  Glucose 70 - 99 mg/dL 87  87  89   BUN 6 - 20 mg/dL 10  15  14    Creatinine 0.44 - 1.00 mg/dL 9.31  9.24  9.20   Sodium 135 - 145 mmol/L 137  136  138   Potassium 3.5 - 5.1 mmol/L 3.9  3.9  4.0   Chloride 98 - 111 mmol/L 107  105  109   CO2 22 - 32 mmol/L 25  22  23    Calcium  8.9 - 10.3 mg/dL 8.6  8.9  8.9     11.  Mild transaminitis with rhabdomyolysis.  Both resolved  12.  Hypotension:  D/c flomax , continue to monitor BP TID -2/1-2/25 BP fine/stable, cont monitoring  Vitals:   06/18/23 1330 06/18/23 2019 06/19/23 0543 06/19/23 1306  BP: 109/82 104/71 102/69 107/63   06/19/23 1958 06/20/23 0414 06/20/23 1553 06/20/23 2040  BP: 105/70 (!) 91/59 107/89 (!) 90/57   06/21/23 0505 06/21/23 1646 06/21/23 1949 06/22/23 0432  BP: 95/70 105/80 104/66 109/70     13. HTN: resolved  14. Impaired initiation: resolved, d/c amantadine   -Consider restart amantadine -- restarted 100mg  daily  15. Hyperglycemia: CBGs mildly elevated, d/ced checks. Prosource d/ced as per patient's request  16. Spasticity:   off baclofen  and tizanidine - much improved  17.  Dyskinetic movements in head and neck -  -  resolved   18. Cervical extensor weakness: improved, continue strengthening exercises  19. Bowel and bladder incontinence: continue bowel and bladder program. Flomax  started for urgency-- d/c'd  -06/17/23 LBM yesterday  20. Fatigue: resolved, d/c b complex   21. MRSA nares positive: negative on repeat, precautions d/ced  22. Tachycardic: resolved  23. S/p fall: CT head reviewed and is stable  24. Bilateral foot drop: bilateral AFOs ordered, discussed that these can help her to walk better, continue  25. Vaginal itching: resolved  26. Dysuria resolved  27. Poor sitting balance: continue OT/PT, improving  28. Headaches: topamax  16m daily prn ordered, will d/c since not requiring  29. Drowsiness with tramadol : d/ced  30. IUD in place: Pt  states she has IUD x42yrs  and believes it's supposed to be removed soon(chart review- Dr. Lavoie placed Mirena  IUD on 07/01/19, good for 65yrs); needs outpatient follow-up with OB/GYN  31. Constipation: d/c baclofen , last BM 12/18, milk of magnesia ordered 12/20  -LBM 1/27, restart magnesium  gluconate 250mg  HS  Continue current regimen  32. History of drug addiction: tramadol  d/ced as per mom's preference  33. Depression: discussed scheduling for outpatient psychology/psychiatry follow-up, discussed neuropsych f/u while in hospital  -appreciate Dr. Darden follow up  34. Sinusitis: claritin  ordered prn, will d/c since not requiring  35. Pruritus: sarna lotion ordered, discussed that this is prn, will d/c since not requiring  26. Low back pain: XR ordered and discussed that it is negative, continue lidocaine  patch, tramadol  added prn, advised that she will have to ask for medication if she feels that she needs it, baclofen  scheduled HS  27. Polypharmacy: amantadine  d/ced since no changes noted with it, continue current medications  28. Insomnia: change melatonin to prn, continue  29. Muscle spasms: baclofen  scheduled HS  LOS: 216 days A FACE TO FACE EVALUATION WAS PERFORMED  Sven P Quint Chestnut 06/22/2023, 10:29 AM

## 2023-06-22 NOTE — Progress Notes (Signed)
 Occupational Therapy Session Note  Patient Details  Name: Nicole [name] MRN: [id number] Date of Birth: 04-Jan-1976  Today's Date: 06/22/2023 OT Individual Time: 1031-1100 OT Individual Time Calculation (min): 29 min    Short Term Goals: Week 9: OT Short Term Goal 1 (Week 9): Patient will walk to bathroom without device and close supervision OT Short Term Goal 1 - Progress (Week 9): Other (comment) OT Short Term Goal 2 (Week 9): Patient will don bra with no more than 3 verbal cues OT Short Term Goal 2 - Progress (Week 9): Other (comment) OT Short Term Goal 3 (Week 9): Patient will complete simple meal prep with min assist OT Short Term Goal 3 - Progress (Week 9): Other (comment) OT Short Term Goal 4 (Week 9): xxx OT Short Term Goal 4 - Progress (Week 9): Other (comment) OT Short Term Goal 5 (Week 9): xxx OT Short Term Goal 5 - Progress (Week 9): Other (comment)  Skilled Therapeutic Interventions/Progress Updates:    Patient received supine in bed excited to get up and take a shower.  Walked to bathroom with RW and supervision.  Patient managing turnings, slight inline/decline more safely in her room.  Patient attempted to void on toilet without success.  In standing to alk to shower patient with incontinent void.  Patient continues to lack control of bladder.  Patient showered with cueing and assist to wash back.  Patient needs cueing for time management.  Patient able to put all clothing on with intermittent min assist and occasional cueing.  Left up in wheelchair at sink for oral care.  Chair pad alarm engaged and telesitter in place.    Therapy Documentation Precautions:  Precautions Precautions: Fall Precaution Comments: decreased stand balance- reliant on UE, Pain in LLE limits mobility at times Required Braces or Orthoses: Other Brace Other Brace: B adjustable night splints; B elbow cosey splints; L palm protector; R mitt Restrictions Weight Bearing Restrictions Per Provider Order:  No   Pain:  Denies pain    Therapy/Group: Individual Therapy  Jamai Dolce M 06/22/2023, 12:41 PM

## 2023-06-22 NOTE — Progress Notes (Signed)
 Physical Therapy Weekly Progress Note  Patient Details  Name: Nicole [name] MRN: [id number] Date of Birth: 10/23/1975  Beginning of progress report period: June 07, 2023 End of progress report period: June 22, 2023  [name] is continuing to make appropriate progress with functional mobility. Overall, she completes bed mobility at supervision level, sit<>Stands to RW with supervision, and is ambulating in open spaces with supervision and RW. She needs CGA to intermittent minA for navigating tight spaces due to increased instability with turns and dynamic balance. She continues to be limited by cognitive deficits, motor apraxia, decreased awareness, impaired sustained attention, joint tightness, lower back pain, and impaired activity tolerance.   Patient continues to demonstrate the following deficits muscle weakness and muscle joint tightness, decreased cardiorespiratoy endurance, unbalanced muscle activation, motor apraxia, decreased coordination, and decreased motor planning, decreased attention, decreased awareness, decreased problem solving, and decreased safety awareness, and decreased standing balance, decreased postural control, hemiplegia, and decreased balance strategies and therefore will continue to benefit from skilled PT intervention to increase functional independence with mobility.  Patient progressing toward long term goals.   Continue plan of care.  PT Short Term Goals Week 1:  PT Short Term Goal 1 (Week 1): Pt will perform supine<>sit with supervision PT Short Term Goal 1 - Progress (Week 1): Other (comment) (new goal) PT Short Term Goal 2 (Week 1): Pt will ambulate at least 121ft with no more than min A, not using an AD. PT Short Term Goal 2 - Progress (Week 1): Other (comment) (new goal) PT Short Term Goal 3 (Week 1): Pt will demonstrate decreased fall risk as noted by an improvement of at least 7 points on the [employer] Test PT Short Term Goal 3 - Progress (Week 1):  Other (comment) (new goal) PT Short Term Goal 4 (Week 1): Pt will navigate 4 steps using HRs with no more than CGA  Therapy Documentation Precautions:  Precautions Precautions: Fall Precaution Comments: decreased stand balance- reliant on UE, Pain in LLE limits mobility at times Required Braces or Orthoses: Other Brace Other Brace: B adjustable night splints; B elbow cosey splints; L palm protector; R mitt Restrictions Weight Bearing Restrictions Per Provider Order: No General:    Cathren Sween P Jigar Zielke  PT, DPT, CSRS  06/22/2023, 7:57 AM

## 2023-06-22 NOTE — Progress Notes (Signed)
 Physical Therapy Session Note  Patient Details  Name: Nicole [name] MRN: [id number] Date of Birth: 1976/02/23  Today's Date: 06/22/2023 PT Individual Time: 0800-0841 PT Individual Time Calculation (min): 41 min   Short Term Goals: Week 1:  PT Short Term Goal 1 (Week 1): Pt will perform supine<>sit with supervision PT Short Term Goal 1 - Progress (Week 1): Other (comment) (new goal) PT Short Term Goal 2 (Week 1): Pt will ambulate at least 112ft with no more than min A, not using an AD. PT Short Term Goal 2 - Progress (Week 1): Other (comment) (new goal) PT Short Term Goal 3 (Week 1): Pt will demonstrate decreased fall risk as noted by an improvement of at least 7 points on the [employer] Test PT Short Term Goal 3 - Progress (Week 1): Other (comment) (new goal) PT Short Term Goal 4 (Week 1): Pt will navigate 4 steps using HRs with no more than CGA  Skilled Therapeutic Interventions/Progress Updates:      Pt presents in bed to start - agreeable to therapy and no reports of pain. Bed mobility completed at supervision level. Patient don's pants with setupA but first puts them on backwards and min cues needed for awareness. She donned shoes with setupA but also donned these on the wrong foot, needing min cues for awareness. Pt with a little more disorganized thought this morning.   Sit<>Stand with no AD and CGA. At the sink she completes oral care and hair care in standing with supervision for balance.   Ambulated with supervision and RW back to day room rehab gym ~111ft. Pt setup on Treadmill with no harness support. She ambulated for 5.5 minutes before becoming fatigued. Gait speed up to 1. for power walking, able to cover 565ft. CGA for safety while ambulating on treadmill. Min cues for safety awareness and sustained attention to reduce her falls risk.   Pt returned to her room at end of session. Left sitting upright in wheelchair with chair pad alarm on. Call bell within reach, all needs  met.   Therapy Documentation Precautions:  Precautions Precautions: Fall Precaution Comments: decreased stand balance- reliant on UE, Pain in LLE limits mobility at times Required Braces or Orthoses: Other Brace Other Brace: B adjustable night splints; B elbow cosey splints; L palm protector; R mitt Restrictions Weight Bearing Restrictions Per Provider Order: No General:      Therapy/Group: Individual Therapy  [name] 06/22/2023, 7:56 AM

## 2023-06-22 NOTE — Progress Notes (Signed)
 Speech Language Pathology Weekly Progress Note  Patient Details  Name: Nicole [name] MRN: [id number] Date of Birth: 09/07/1975  Beginning of progress report period: June 15, 2023 End of progress report period: June 22, 2023  Short Term Goals: Week 9: SLP Short Term Goal 1 (Week 9): Week 30 - Pt will solve mildly complex environmental problems with 70% accuracy given min assist. SLP Short Term Goal 1 - Progress (Week 9): Met SLP Short Term Goal 2 (Week 9): Week 30 - Pt will demonstrate emergent awareness of mistakes during functional environmental tasks and correct mistakes in 3/5 opportunities given min assist. SLP Short Term Goal 2 - Progress (Week 9): Met  New Short Term Goals: Week 9: SLP Short Term Goal 1 (Week 9): Week 31 - Pt will solve mildly complex environmental problems with 75% accuracy given min assist. SLP Short Term Goal 2 (Week 9): Week 31 - Pt will demonstrate emergent awareness of mistakes during functional environmental tasks and correct mistakes in 4/5 opportunities given min assist.  Weekly Progress Updates: Patient continues to make steady gains towards therapy goals, meeting 2/2 short term goals set this reporting period. Depending on familiarity of the task, patient demonstrates problem solving of mildly complex tasks with 70% accuracy when given min assist. She demonstrates emergent awareness of mistakes during these tasks in 3/5 opportunities given min assist, though requires mod cues to initiate mistake correction. Patient and family education ongoing. Patient will continue to benefit from skilled therapy services during remainder of CIR stay.     Intensity: Minumum of 1-2 x/day, 30 to 90 minutes Frequency: 3 to 5 out of 7 days Duration/Length of Stay: TBD due to placement Treatment/Interventions: Cognitive remediation/compensation;Internal/external aids;Speech/Language facilitation;Cueing hierarchy;Environmental controls;Therapeutic Activities;Functional  tasks;Multimodal communication approach;Patient/family education;Therapeutic Exercise  Rosina Downy, M.A., CCC-SLP  Anh Bigos A Ramanda Paules 06/22/2023, 8:02 AM

## 2023-06-23 MED ORDER — ORAL CARE MOUTH RINSE: 15.0000 mL | OROMUCOSAL | Status: DC | PRN
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
## 2023-06-23 NOTE — Plan of Care (Signed)
  Problem: Consults Goal: RH STROKE PATIENT EDUCATION Description: See Patient Education module for education specifics  Outcome: Progressing   Problem: RH BOWEL ELIMINATION Goal: RH STG MANAGE BOWEL WITH ASSISTANCE Description: STG Manage Bowel with mod I Assistance. Outcome: Progressing Goal: RH STG MANAGE BOWEL W/MEDICATION W/ASSISTANCE Description: STG Manage Bowel with Medication with mod I Assistance. Outcome: Progressing   Problem: RH BLADDER ELIMINATION Goal: RH STG MANAGE BLADDER WITH ASSISTANCE Description: STG Manage Bladder With toileting Assistance Outcome: Progressing   Problem: RH SKIN INTEGRITY Goal: RH STG MAINTAIN SKIN INTEGRITY WITH ASSISTANCE Description: STG Maintain Skin Integrity With min  Assistance. Outcome: Progressing

## 2023-06-24 NOTE — Plan of Care (Signed)
  Problem: Consults Goal: RH STROKE PATIENT EDUCATION Description: See Patient Education module for education specifics  Outcome: Progressing   Problem: RH BOWEL ELIMINATION Goal: RH STG MANAGE BOWEL WITH ASSISTANCE Description: STG Manage Bowel with mod I Assistance. Outcome: Progressing Goal: RH STG MANAGE BOWEL W/MEDICATION W/ASSISTANCE Description: STG Manage Bowel with Medication with mod I Assistance. Outcome: Progressing   Problem: RH BLADDER ELIMINATION Goal: RH STG MANAGE BLADDER WITH ASSISTANCE Description: STG Manage Bladder With toileting Assistance Outcome: Progressing   Problem: RH SKIN INTEGRITY Goal: RH STG MAINTAIN SKIN INTEGRITY WITH ASSISTANCE Description: STG Maintain Skin Integrity With min  Assistance. Outcome: Progressing

## 2023-06-24 NOTE — Progress Notes (Signed)
 Multi requests for assistance to BR, usually incontinent prior to going to BR or will be incontinent as soon as patient gets back in bed. Patient provided supplies needed to change self.  PRN melatonin given at 2047 per patient's request. PRN ultram  given at 2332 for complaint of back pain, rated [name] 6 on pain scale. SCD's on intermittently during night.  Currently resting quietly without complaint of. Nicole [name]
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
## 2023-06-25 NOTE — Plan of Care (Signed)
  Problem: Consults Goal: RH STROKE PATIENT EDUCATION Description: See Patient Education module for education specifics  06/25/2023 1532 by Theone Bowell M, LPN Outcome: Progressing 06/25/2023 1432 by Suzannah Bettes M, LPN Outcome: Progressing   Problem: RH BOWEL ELIMINATION Goal: RH STG MANAGE BOWEL WITH ASSISTANCE Description: STG Manage Bowel with mod I Assistance. 06/25/2023 1532 by Chrystle Murillo M, LPN Outcome: Progressing 06/25/2023 1432 by Sherrye Puga M, LPN Outcome: Progressing Goal: RH STG MANAGE BOWEL W/MEDICATION W/ASSISTANCE Description: STG Manage Bowel with Medication with mod I Assistance. 06/25/2023 1532 by Deandrea Vanpelt M, LPN Outcome: Progressing 06/25/2023 1432 by Tredarius Cobern M, LPN Outcome: Progressing   Problem: RH BLADDER ELIMINATION Goal: RH STG MANAGE BLADDER WITH ASSISTANCE Description: STG Manage Bladder With toileting Assistance 06/25/2023 1532 by Rosana Comings, LPN Outcome: Progressing 06/25/2023 1432 by Rosana Comings, LPN Outcome: Progressing   Problem: RH SKIN INTEGRITY Goal: RH STG MAINTAIN SKIN INTEGRITY WITH ASSISTANCE Description: STG Maintain Skin Integrity With min  Assistance. 06/25/2023 1532 by Avyon Herendeen M, LPN Outcome: Progressing 06/25/2023 1432 by [name] M, LPN Outcome: Progressing

## 2023-06-25 NOTE — Progress Notes (Signed)
 Slept good. PRN ultram  given at 1952. Continent & incontinent of urine. LBM 02/09. Dyskinetic movements when on toilet. Encourage patient to perform as much hygiene, diaper and clothing change as possible. Wore SCD's most of night. Vercie Pokorny A

## 2023-06-25 NOTE — Progress Notes (Signed)
 PROGRESS NOTE   Subjective/Complaints: No new complaints this morning Appreciate nursing note overnight No new issues overnight Vitals stable  ROS: as per HPI. Denies CP, SOB, abd pain, N/V/D/C, or any other complaints at this time.  +low back pain- variable, but managable, +muscle spasms    Objective:   No results found.    No results for input(s): "WBC", "HGB", "HCT", "PLT" in the last 72 hours.          No results for input(s): "NA", "K", "CL", "CO2", "GLUCOSE", "BUN", "CREATININE", "CALCIUM " in the last 72 hours.           Intake/Output Summary (Last 24 hours) at 06/25/2023 1008 Last data filed at 06/25/2023 0715 Gross per 24 hour  Intake 680 ml  Output --  Net 680 ml           Physical Exam: Vital Signs Blood pressure 105/78, pulse 73, temperature 97.9 F (36.6 C), resp. rate 16, height 5\' 1"  (1.549 m), weight 60.5 kg, SpO2 97%.  Constitutional: No distress . Vital signs reviewed. Resting in bed, comfortable, wanting to go to the restroom.  HEENT: NCAT, EOMI, oral membranes moist Neck: supple Cardiovascular: RRR without murmur. No JVD    Respiratory/Chest: CTA Bilaterally without wheezes or rales. Normal effort GI/Abdomen: BS +, non-tender, non-distended, soft Ext: no clubbing, cyanosis, or edema Psych: pleasant and cooperative   Skin: + Scaling dried skin on bilateral heels  PRIOR EXAMS: Neurologic: alert, oriented to person, place. Fair insight Phonation and speech intelligability normal. STM deficits. Minimal hypertonicity RUE and RLE. Hypersensitivity to left hand improved, Left sided strength improved, decreased range of motion and pain at end range of motion in left shoulder, stable 2/10  Musculoskeletal:some limitations in right shoulder ROM, LB TTP     Assessment/Plan: 1. Functional deficits which require 3+ hours per day of interdisciplinary therapy in a comprehensive  inpatient rehab setting. Physiatrist is providing close team supervision and 24 hour management of active medical problems listed below. Physiatrist and rehab team continue to assess barriers to discharge/monitor patient progress toward functional and medical goals  Care Tool:  Bathing    Body parts bathed by patient: Face, Abdomen, Chest, Left upper leg, Right upper leg, Left arm, Front perineal area, Right arm, Right lower leg, Left lower leg, Buttocks   Body parts bathed by helper: Buttocks Body parts n/a: Right arm, Left arm, Front perineal area, Buttocks, Right upper leg, Left upper leg, Right lower leg, Left lower leg, Chest, Abdomen, Face   Bathing assist Assist Level: Supervision/Verbal cueing     Upper Body Dressing/Undressing Upper body dressing   What is the patient wearing?: Bra, Pull over shirt    Upper body assist Assist Level: Minimal Assistance - Patient > 75%    Lower Body Dressing/Undressing Lower body dressing      What is the patient wearing?: Underwear/pull up, Pants     Lower body assist Assist for lower body dressing: Supervision/Verbal cueing     Toileting Toileting Toileting Activity did not occur (Clothing management and hygiene only): N/A (no void or bm)  Toileting assist Assist for toileting: Supervision/Verbal cueing     Transfers Chair/bed transfer  Transfers assist  Chair/bed transfer activity did not occur: Safety/medical concerns  Chair/bed transfer assist level: Supervision/Verbal cueing Chair/bed transfer assistive device: Armrests, Geologist, engineering   Ambulation assist   Ambulation activity did not occur: Safety/medical concerns  Assist level: Contact Guard/Touching assist Assistive device: Walker-rolling Max distance: 300'   Walk 10 feet activity   Assist  Walk 10 feet activity did not occur: Safety/medical concerns  Assist level: Contact Guard/Touching assist Assistive device: Walker-rolling   Walk  50 feet activity   Assist Walk 50 feet with 2 turns activity did not occur: Safety/medical concerns  Assist level: Contact Guard/Touching assist Assistive device: Walker-rolling    Walk 150 feet activity   Assist Walk 150 feet activity did not occur: Safety/medical concerns  Assist level: Contact Guard/Touching assist Assistive device: Walker-rolling    Walk 10 feet on uneven surface  activity   Assist Walk 10 feet on uneven surfaces activity did not occur: Safety/medical concerns   Assist level: Contact Guard/Touching assist Assistive device: Walker-rolling   Wheelchair     Assist Is the patient using a wheelchair?: Yes Type of Wheelchair: Manual    Wheelchair assist level: Supervision/Verbal cueing Max wheelchair distance: 153ft    Wheelchair 50 feet with 2 turns activity    Assist    Wheelchair 50 feet with 2 turns activity did not occur: Safety/medical concerns   Assist Level: Supervision/Verbal cueing   Wheelchair 150 feet activity     Assist  Wheelchair 150 feet activity did not occur: Safety/medical concerns   Assist Level: Supervision/Verbal cueing   Blood pressure 105/78, pulse 73, temperature 97.9 F (36.6 C), resp. rate 16, height 5\' 1"  (1.549 m), weight 60.5 kg, SpO2 97%.  Medical Problem List and Plan: 1. 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 Awaiting approval of disability. SW has continued to work on placement. We also have engaged leadership within hospital for options. Code status changed to full code after obtaining patient  consent -therapies are daily only SW note reviewed and we do not have any accepting facilities thus far Discussed that we are waiting to hear from disability  2.  Impaired mobility: d/c lovenox  since ambulating >150 feet, continue SCDs, discussed that she still cannot turn safely  3. Diffuse pains: resolved, gabapentin  d/ced, lidocaine  patches for back pain  4. History of anxiety: discussed with her mom that she feels this was a big risk factor for her accident, propanolol d/ced due to hypotension. Asked SW to place her on neuropsych list, Buspar  ordered prn, continue this. Discussed placing outpatient referral for behavioral health f/u, discussed that Buspar  has been helpful for her. Decreased buspar  to daily prn, recreational therapy consulted  5. Impulsive, impaired cognition: renewed safety restraints. This patient is not capable of making decisions on her own behalf, messaged nursing to see if they feel she still needs restraints  6. Skin: eucerin cream BID -Buttock/sacrum--foam dressing, pressure relief, nutrition -stage 3 left heel, resolved  7. Fluids/Electrolytes/Nutrition: Routine in and outs with follow-up chemistries  -Eating  well with cueing   Upgraded to regular diet  -Vit D 4000U daily  8.  Cavitary right lower lobe pneumonia likely aspiration pneumonia/MRSA pneumonia.  Resolved    Latest Ref Rng & Units 05/30/2023    5:25 AM 05/16/2023    6:28 AM 05/02/2023    5:20 AM  CBC  WBC 4.0 - 10.5 K/uL 5.3  6.2  5.9   Hemoglobin 12.0 - 15.0 g/dL 16.1  09.6  04.5   Hematocrit 36.0 - 46.0 % 41.5  42.1  43.0   Platelets 150 - 400 K/uL 223  300  241      9.  History of drug abuse.  Positive cocaine on urine drug screen.  Provide counseling  10.  AKI/hypovolemia and ATN.  Resolved -BUN/creatinine stable 1/20  -Potassium 3.9 1/20  -1/24 will check cmet on monday    Latest Ref Rng & Units 05/30/2023    5:25 AM 05/16/2023    6:28 AM 05/02/2023    5:20 AM  BMP  Glucose  70 - 99 mg/dL 87  87  89   BUN 6 - 20 mg/dL 10  15  14    Creatinine 0.44 - 1.00 mg/dL 4.09  8.11  9.14   Sodium 135 - 145 mmol/L 137  136  138   Potassium 3.5 - 5.1 mmol/L 3.9  3.9  4.0   Chloride 98 - 111 mmol/L 107  105  109   CO2 22 - 32 mmol/L 25  22  23    Calcium  8.9 - 10.3 mg/dL 8.6  8.9  8.9     11.  Mild transaminitis with rhabdomyolysis.  Both resolved  12.  Hypotension:  D/c flomax , continue to monitor BP TID -2/1-9/25 BP fine/stable, cont monitoring  Vitals:   06/21/23 1949 06/22/23 0432 06/22/23 1315 06/22/23 2021  BP: 104/66 109/70 105/70 97/60   06/23/23 0514 06/23/23 1335 06/23/23 1457 06/23/23 1944  BP: 103/76 107/89 101/66 104/69   06/24/23 0544 06/24/23 1405 06/24/23 2011 06/25/23 0400  BP: 91/67 101/78 98/71 105/78     13. HTN: resolved  14. Impaired initiation: resolved, d/c amantadine   -Consider restart amantadine -- restarted 100mg  daily  15. Hyperglycemia: CBGs mildly elevated, d/ced checks. Prosource d/ced as per patient's request  16. Spasticity:   off baclofen  and tizanidine - much improved-- back on baclofen   17.  Dyskinetic movements in head and neck -  -  resolved   18. Cervical extensor weakness: improved, continue strengthening exercises  19. Bowel and bladder incontinence: continue bowel and bladder program. Flomax  started for urgency-- d/c'd -06/24/23 pt frustrated with incontinence/how long it takes her to try to urinate; discussed that she's had a variety of meds tried with no significant difference; continue trying timed toileting, but wonder if outpatient urology f/up for urodynamic studies would be beneficial?  20. Fatigue: resolved, d/c b complex   21. MRSA nares positive: negative on repeat, precautions d/ced  22. Tachycardic: resolved  23. S/p fall: CT head reviewed and is stable  24. Bilateral foot drop: bilateral AFOs ordered, discussed that these can help her to walk better, continue  25. Vaginal itching: resolved  26.  Dysuria resolved  27. Poor sitting balance: continue OT/PT, improving  28. Headaches: topamax  68m daily prn ordered, will d/c since not requiring  29. Drowsiness with tramadol : d/ced  30. IUD in place: Pt states she has IUD x49yrs  and believes it's supposed to be removed soon(chart review- Dr. Lavoie placed Mirena  IUD on 07/01/19, good for 28yrs); needs outpatient follow-up with OB/GYN  31. Constipation: d/c baclofen , last BM 12/18, milk of magnesia  ordered 12/20  -LBM 1/27, restart magnesium  gluconate 250mg  HS  Continue current regimen  -06/24/23 LBM yesterday, cont regimen  32. History of drug addiction: tramadol  d/ced as per mom's preference  33. Depression: discussed scheduling for outpatient psychology/psychiatry follow-up, discussed neuropsych f/u while in hospital  -appreciate Dr. Minerva Alvine follow up  34. Sinusitis: claritin  ordered prn, will d/c since not requiring  35. Pruritus: sarna lotion ordered, discussed that this is prn, will d/c since not requiring  26. Low back pain: XR ordered and discussed that it is negative, continue lidocaine  patch, tramadol  added prn, advised that she will have to ask for medication if she feels that she needs it, baclofen  scheduled HS  27. Polypharmacy: amantadine  d/ced since no changes noted with it, continue current medications  28. Insomnia: change melatonin to prn, continue, baclofen  scheduled HS  29. Muscle spasms: baclofen  scheduled HS, continue  LOS: 219 days A FACE TO FACE EVALUATION WAS PERFORMED  Inessa Wardrop P Maisha Bogen 06/25/2023, 10:08 AM

## 2023-06-25 NOTE — Progress Notes (Signed)
 Occupational Therapy Session Note  Patient Details  Name: Nicole [name] MRN: [id number] Date of Birth: 1976/02/01  Today's Date: 06/25/2023 OT Individual Time: 4098-1191 OT Individual Time Calculation (min): 45 min    Short Term Goals: Week 9: OT Short Term Goal 1 (Week 9): Patient will walk to bathroom without device and close supervision OT Short Term Goal 1 - Progress (Week 9): Other (comment) OT Short Term Goal 2 (Week 9): Patient will don bra with no more than 3 verbal cues OT Short Term Goal 2 - Progress (Week 9): Other (comment) OT Short Term Goal 3 (Week 9): Patient will complete simple meal prep with min assist  Skilled Therapeutic Interventions/Progress Updates:     Pt received sitting up in wc dressed and ready for the day upon OT arrival. Pt presenting to be in good spirits receptive to skilled OT session reporting 0/10 pain- OT offering intermittent rest breaks, repositioning, and therapeutic support to optimize participation in therapy session. Focus this session on L UE NMRE, dynamic balance, and functional cognition.  Functional Mobility:  -Pt propelled wc <> therapy spaces this session with supervision with min verbal cues required for L side attention to avoid obstacle and for technique to complete larger pushed with L UE.  -Pt completed functional mobility wc > EOM using RW ~15 ft with close supervision.   Therapeutic Activity:  -Engaged Pt in completing dual task picture replication activity using medium sized pegs while standing at elevated table. Pt instructed to utilize R UE to place pegs in board to recreate pictures while WB'ing through L UE positioned on table top to facilitate a prolonged stretch. Pt able to complete task with mod verbal cues overall to correct mistakes and attend to picture pattern- Pt able to complete first quarter of picture with 100% accuracy with min questioning cues, however required increased verbal cues following d/t decreased attention.    NMRE:  -Sitting EOM, engaged Pt in completing gentle AAROM functional movements of L UE to decrease pain and increase ROM in preparation for functional tasks. Engaged Pt in completing wrist flexion/extension, elbow flexion/extension, scapular protraction/retraction, digit flexion/extension, and shoulder flexion. Positioned L UE on mat table beside Pt and engaged Pt in completing gentle chest opener stretch rotating towards/away from L UE with Pt reporting decreased pain in L side of neck and L shoulder following. Pt then completed the following exercises for L UE NMRE to increase functional use for BADLs:  -Shoulder internal rotation Ball passes behind the back 1# weighted ball 1x10 reps in each direction -Shoulder external rotation ball passed behind the head 1#weighted ball 1x10 reps in each direction -Forearm pronation/supination using 2# dumbbell with forearm supported -Scapular protraction/retraction with L UE supported on large yoga ball -Shoulder abduction/adduction with L UE supported on large yoga ball -AP chest press ball passes using 2#weighted ball  Min-mod tactile and verbal required for technique, muscle activation, and to incorporate deep breathing into exercises. Pt with increased athetoid trunk movements when putting in maximal effort to complete exercises.   Pt was left resting in wc with call bell in reach, seatbelt alarm on, and all needs met.    Therapy Documentation Precautions:  Precautions Precautions: Fall Precaution Comments: decreased stand balance- reliant on UE, Pain in LLE limits mobility at times Required Braces or Orthoses: Other Brace Other Brace: B adjustable night splints; B elbow "cosey" splints; L palm protector; R mitt Restrictions Weight Bearing Restrictions Per Provider Order: No   Therapy/Group: Individual Therapy  Avanell Bob  P Woodson 06/25/2023, 2:11 PM

## 2023-06-25 NOTE — Progress Notes (Signed)
 Speech Language Pathology Daily Session Note  Patient Details  Name: Nicole [name] MRN: [id number] Date of Birth: 09-25-1975  Today's Date: 06/25/2023 SLP Individual Time: 1300-1330 SLP Individual Time Calculation (min): 30 min  Short Term Goals: Week 9: SLP Short Term Goal 1 (Week 9): Week 31 - Pt will solve mildly complex environmental problems with 75% accuracy given min assist. SLP Short Term Goal 2 (Week 9): Week 31 - Pt will demonstrate emergent awareness of mistakes during functional environmental tasks and correct mistakes in 4/5 opportunities given min assist.  Skilled Therapeutic Interventions:   Pt greeted at bedside. She was awake/alert in her wheelchair after completing her noon meal. She was very pleasant/cooperative throughout tx tasks targeting mildly complex reasoning and problem solving. Of note, complex deductive reasoning task modified to remain only mildly complex. With modifications, she was able to complete the task with only min cues to remain 75% accurate and modA cues to remain 90% accurate. She was able to ID errors with modA verbal cues. She was left in her chair with her call light within reach upon SLP departure. Recommend cont ST per POC.   Pain  No pain reported at rest. Pt reported 5/10 back pain w/ movement.   Therapy/Group: Individual Therapy  Rozell Cornet 06/25/2023, 4:37 PM

## 2023-06-25 NOTE — Progress Notes (Signed)
 Occupational Therapy Session Note  Patient Details  Name: Nicole [name] MRN: [id number] Date of Birth: 1976/03/27  Today's Date: 06/25/2023 OT Individual Time: 1501-1530 OT Individual Time Calculation (min): 29 min    Short Term Goals: Week 28: OT Short Term Goal 1: Patient will walk to bathroom without device and close supervision OT Short Term Goal 2: Patient will don bra with no more than 3 verbal cues OT Short Term Goal 3: Patient will complete simple meal prep with min assist   Skilled Therapeutic Interventions/Progress Updates:    Patient agreeable to participate in OT session. Reports 0/10 pain level.   Patient participated in skilled OT session focusing on dynamic standing balance, Motor coordination, visual perception, and problem solving while participating in Wii bowling game. Pt completed two frames standing and remaining game frame seated. Max difficulty with motor coordination while manipulating game controller buttons. Required several attempts to successfully roll bowling ball down line.   Pt completed functional mobility during session using RW with SBA while walking to/from room and day room.   Therapy Documentation Precautions:  Precautions Precautions: Fall Weight Bearing Restrictions Per Provider Order: No  Therapy/Group: Individual Therapy  Carollee Circle, OTR/L,CBIS  Supplemental OT - [facility] and [facility] Secure Chat Preferred   06/25/2023, 3:55 PM
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
## 2023-06-26 NOTE — Progress Notes (Signed)
Physical Therapy Session Note  Patient Details  Name: Nicole [name] MRN: [id number] Date of Birth: June 07, 1975  Today's Date: 06/26/2023 PT Individual Time: 9629-5284 PT Individual Time Calculation (min): 30 min   Short Term Goals: Week 1:  PT Short Term Goal 1 (Week 1): Pt will perform supine<>sit with supervision PT Short Term Goal 1 - Progress (Week 1): Other (comment) (new goal) PT Short Term Goal 2 (Week 1): Pt will ambulate at least 144ft with no more than min A, not using an AD. PT Short Term Goal 2 - Progress (Week 1): Other (comment) (new goal) PT Short Term Goal 3 (Week 1): Pt will demonstrate decreased fall risk as noted by an improvement of at least 7 points on the [employer] Test PT Short Term Goal 3 - Progress (Week 1): Other (comment) (new goal) PT Short Term Goal 4 (Week 1): Pt will navigate 4 steps using HRs with no more than CGA  Skilled Therapeutic Interventions/Progress Updates:      Pt sleeping soundly in bed to start - awakens to voice and in agreement to therapy session. Denies pain but she reports being happy she slept well last night.   Supine<>sitting EOB with supervision. Don's shoes with setupA and she requests to use bathroom before leaving her room. Supervision with RW for ambulation in the room. Pt noted to have incontinent, wet brief - assist for doffing. Provided her with a new clean brief but she donned the brief over her pants; continues to demonstrate disorganized sequencing and thought. Assisted patient with donning clean brief, needing to remove shoes and pants first.   Ambulated at supervision level with RW ~340ft - cues for pacing herself and managing RW. Worked on BITS in sitting position - challenged patient with cognitive program for chart puzzle where she was instructed to match random letter/# combination (I.e. E8) and match row/column. Pt highly distracted, unable to have sustained attention in quite space. Pt also demonstrating athetoid trunk/head  movements; will follow up at team conf tomorrow. Pt needing max cues for complete moderate to complex puzzle on BITS program.  Returned to her room and patient left sitting upright in wheelchair - all needs met with chair pad alarm on.   Therapy Documentation Precautions:  Precautions Precautions: Fall Precaution/Restrictions Comments: decreased stand balance- reliant on UE, Pain in LLE limits mobility at times Required Braces or Orthoses: Other Brace Other Brace: B adjustable night splints; B elbow "cosey" splints; L palm protector; R mitt Restrictions Weight Bearing Restrictions Per Provider Order: No General:     Therapy/Group: Individual Therapy  Orrin Brigham 06/26/2023, 7:44 AM

## 2023-06-26 NOTE — Progress Notes (Signed)
Patient ID: Nicole [name], female   DOB: [birth year], 48 y.o.   MRN: [id number]  Contacted [employer] Vernona Rieger to check on status of pt;s case. She has received th case back yet from the MD. Spoke with Lisa[employer] who is perplexed she still has [employer] insurance now she has been approved for long term care medicaid she is asking her boss regarding this. She reports it should be some other insurance now. She will get back with this worker when has an answer

## 2023-06-26 NOTE — Progress Notes (Signed)
Occupational Therapy Session Note  Patient Details  Name: Nicole [name] MRN: [id number] Date of Birth: [birth year]  Today's Date: 06/26/2023 OT Individual Time: 1135-1205 OT Individual Time Calculation (min): 30 min    Short Term Goals:  Week 28: OT Short Term Goal 1: Patient will walk to bathroom without device and close supervision OT Short Term Goal 2: Patient will don bra with no more than 3 verbal cues OT Short Term Goal 3: Patient will complete simple meal prep with min assist  Skilled Therapeutic Interventions/Progress Updates:   Pt seen for skilled OT. NT just transferred pt to toilet upon OT arrival. OT hand off and pt able to complete peri hygiene, clothing management and short amb with HHA and close S with min cues for pacing and safety. Stood sink side for hand washing with close S and min cues. Pt refused OT encouragement to don bra this am. Choice of 2 simple games offered for thought flexibility, ability to follow simple written directions adapted by clinician and perform in 3 steps sequentially each turn. Required overall mod cues to apply written simple adapted directions, generate 6 items in each category and follow steps set. Coordination addressed as well with 80% overall legibility when cued. Left pt w/c level for lunch meal with chair alarm set, needs and nurse call button in reach.   Pain: back pain un-rated relieved with repositioning   Therapy Documentation Precautions:  Precautions Precautions: Fall Precaution/Restrictions Comments: decreased stand balance- reliant on UE, Pain in LLE limits mobility at times Required Braces or Orthoses: Other Brace Other Brace: B adjustable night splints; B elbow "cosey" splints; L palm protector; R mitt Restrictions Weight Bearing Restrictions Per Provider Order: No   Therapy/Group: Individual Therapy  Vicenta Dunning 06/26/2023, 7:52 AM

## 2023-06-27 NOTE — Plan of Care (Signed)
  Problem: Consults Goal: RH STROKE PATIENT EDUCATION Description: See Patient Education module for education specifics  Outcome: Progressing   Problem: RH BOWEL ELIMINATION Goal: RH STG MANAGE BOWEL WITH ASSISTANCE Description: STG Manage Bowel with mod I Assistance. Outcome: Progressing Goal: RH STG MANAGE BOWEL W/MEDICATION W/ASSISTANCE Description: STG Manage Bowel with Medication with mod I Assistance. Outcome: Progressing   Problem: RH BLADDER ELIMINATION Goal: RH STG MANAGE BLADDER WITH ASSISTANCE Description: STG Manage Bladder With toileting Assistance Outcome: Progressing   Problem: RH SKIN INTEGRITY Goal: RH STG MAINTAIN SKIN INTEGRITY WITH ASSISTANCE Description: STG Maintain Skin Integrity With min  Assistance. Outcome: Progressing

## 2023-06-27 NOTE — Progress Notes (Signed)
Physical Therapy Session Note  Patient Details  Name: Nicole [name] MRN: [id number] Date of Birth: Dec 25, 1975  Today's Date: 06/27/2023 PT Individual Time: 0865-7846 PT Individual Time Calculation (min): 41 min   Short Term Goals: Week 1:  PT Short Term Goal 1 (Week 1): Pt will perform supine<>sit with supervision PT Short Term Goal 1 - Progress (Week 1): Other (comment) (new goal) PT Short Term Goal 2 (Week 1): Pt will ambulate at least 133ft with no more than min A, not using an AD. PT Short Term Goal 2 - Progress (Week 1): Other (comment) (new goal) PT Short Term Goal 3 (Week 1): Pt will demonstrate decreased fall risk as noted by an improvement of at least 7 points on the [employer] Test PT Short Term Goal 3 - Progress (Week 1): Other (comment) (new goal) PT Short Term Goal 4 (Week 1): Pt will navigate 4 steps using HRs with no more than CGA  Skilled Therapeutic Interventions/Progress Updates:      Pt in bed - agreeable to therapy treatment. Has no complaints of pain. She's attempting to put her hair up in a pony tail - offered assistance but patient reports she wants to complete on her own. Allowed time to try but due to time constraints, deferred to assisting patient. Patient limited by poor attention and some perseveration.   Bed mobility completed with supervision. Completed oral care at the sink in a reasonable time, in standing position with supervision assist. Ambulated with RW and supervision from her room to ortho rehab gym, ~368ft. Safety cues for pacing herself.   Completed BITS activities in sitting position.  Pt completed BITS tasks for NMR and to help with assess cognition. \ -Maze test: Pt able to understand instructions/directions.  Poor visuoconstructional ability and executive function of planning and foresight. She could complete the "sample" test without difficulty but unable to complete the actual "test" without mod cues.   -Trail making test: completed with 85%  accuracy with insufficient/inadequate visual search speed, scanning, and speed of processing. Min cues for attention and awareness.   Returned to her room and patient left sitting upright in wheelchair. Call bell within reach and all her needs met.    Therapy Documentation Precautions:  Precautions Precautions: Fall Precaution/Restrictions Comments: decreased stand balance- reliant on UE, Pain in LLE limits mobility at times Required Braces or Orthoses: Other Brace Other Brace: B adjustable night splints; B elbow "cosey" splints; L palm protector; R mitt Restrictions Weight Bearing Restrictions Per Provider Order: No General:     Therapy/Group: Individual Therapy  Orrin Brigham 06/27/2023, 7:48 AM

## 2023-06-27 NOTE — Progress Notes (Signed)
Speech Language Pathology Daily Session Note  Patient Details  Name: Nicole [name] MRN: [id number] Date of Birth: 1976-01-27  Today's Date: 06/27/2023 SLP Individual Time: 1000-1032 SLP Individual Time Calculation (min): 32 min  Short Term Goals: Week 9: SLP Short Term Goal 1 (Week 9): Week 31 - Pt will solve mildly complex environmental problems with 75% accuracy given min assist. SLP Short Term Goal 2 (Week 9): Week 31 - Pt will demonstrate emergent awareness of mistakes during functional environmental tasks and correct mistakes in 4/5 opportunities given min assist. SLP Short Term Goal 3 (Week 9): Week 11 - Pt will answer basic yes/no questions with modA in 60% of opportunities SLP Short Term Goal 4 (Week 9): Week 11 - Pt will utilize increased vocal intensity w/ maxA verbal cues to remain 70% intelligibile at phrase level  Skilled Therapeutic Interventions:  Patient was seen in am to address cognitive re-training. Pt was alert and seated upright in WC upon SLP arrival. She was agreeable for session. Pt challenged in completing a mildly complex scavenger hunt with goal of identifying places and items around the unit. Pt completed task locating; nutrition board, laundry room, basketball goal, and a random room number given descriptions presented verbally from SLP with min A and 75% acc. In other minutes of session she was challenged to complete a problem solving task identifying priorities and barriers when looking for a hypothetical apartment based off her current needs. Then given guidelines pt was challenged to find an apartment utilizing classified section of a made up newspaper. Pt warranting min A to complete task. Pt returned to her room at conclusion of session with chair alarm left active and call button within reach. SLP to continue POC.   Pain Pain Assessment Pain Scale: 0-10 Pain Score: 0-No pain  Therapy/Group: Individual Therapy  [name] 06/27/2023, 11:39 AM

## 2023-06-27 NOTE — Progress Notes (Signed)
Occupational Therapy Weekly Progress Note  Patient Details  Name: Nicole [name] MRN: [id number] Date of Birth: January 08, 1976  Beginning of progress report period: June 20, 2023 End of progress report period: June 27, 2023  Today's Date: 06/27/2023 OT Individual Time: 0930-1000 OT Individual Time Calculation (min): 30 min    Patient has met 2 of 3 short term goals.  One goal has not been addressed therefore not met  Patient continues to demonstrate the following deficits: muscle weakness and muscle joint tightness, impaired timing and sequencing, abnormal tone, unbalanced muscle activation, motor apraxia, decreased coordination, and decreased motor planning, decreased attention to left and decreased motor planning, decreased initiation, decreased attention, decreased awareness, decreased problem solving, decreased safety awareness, decreased memory, and delayed processing, and decreased standing balance, decreased postural control, hemiplegia, and decreased balance strategies and therefore will continue to benefit from skilled OT intervention to enhance overall performance with BADL and Vocation.  Patient progressing toward long term goals..  Continue plan of care.  OT Short Term Goals Week 1 (week 30?):  OT Short Term Goal 1 (Week 1): Patient will brush hair and put into ponytail with no more than set up assistance OT Short Term Goal 1 - Progress (Week 1): Other (comment) OT Short Term Goal 2 (Week 1): Patient will complete simple snack prep at ambulation level and close supervision OT Short Term Goal 2 - Progress (Week 1): Other (comment) OT Short Term Goal 3 (Week 1): Patient will reach with LUE over 110* shoulder flexion without lateral trunk lean to obtain a lightweight object from shelf. OT Short Term Goal 3 - Progress (Week 1): Other (comment) OT Short Term Goal 4 (Week 1): xxx OT Short Term Goal 4 - Progress (Week 1): Other (comment) OT Short Term Goal 5 (Week 1): xxx OT Short  Term Goal 5 - Progress (Week 1): Other (comment) Week 9: OT Short Term Goal 1 (Week 9): Patient will walk to bathroom without device and close supervision OT Short Term Goal 1 - Progress (Week 9): Progressing toward goal OT Short Term Goal 2 (Week 9): Patient will don bra with no more than 3 verbal cues OT Short Term Goal 2 - Progress (Week 9): Met OT Short Term Goal 3 (Week 9): Patient will complete simple meal prep with min assist OT Short Term Goal 3 - Progress (Week 9): Not met OT Short Term Goal 4 (Week 9): xxx OT Short Term Goal 4 - Progress (Week 9): Other (comment) OT Short Term Goal 5 (Week 9): xxx OT Short Term Goal 5 - Progress (Week 9): Other (comment)  Skilled Therapeutic Interventions/Progress Updates:    Patient received seated in wheelchair moving around her room.  Patient able to put ponytail on hair once hair gathered together.  Patient not able to self organize brushing hair into tail then wrapping.  Patient denied need for toileting.  Gathered laundry and patient able to put clothing into washer, reach beyond the washer and to obtain soap and replace, able to read all dials, correctly choose options and start washing machine.    Taken to gym to address left shoulder and elbow range of motion.  Patient lacks ~ 10-15*of elbow extension, and has limited pure glenohumeral joint motion.  Worked to realign and then mobilize scapula, GH joint, and reduce tension in neck/shoulder.  Left up in wheelchair with safety belt in place and engaged and call bell in reach.    Therapy Documentation Precautions:  Precautions Precautions: Fall Precaution/Restrictions Comments: decreased  stand balance- reliant on UE, Pain in LLE limits mobility at times Required Braces or Orthoses: Other Brace Other Brace: B adjustable night splints; B elbow "cosey" splints; L palm protector; R mitt Restrictions Weight Bearing Restrictions Per Provider Order: No   Pain: Pain Assessment Pain Scale:  0-10 Pain Score: 0-No pain      Therapy/Group: Individual Therapy  [name] 06/27/2023, 12:47 PM

## 2023-06-27 NOTE — Progress Notes (Signed)
PROGRESS NOTE   Subjective/Complaints: No new complaints this morning Did not complain of pain during therapy today, will d/c baclofen since increased athetoid movements noted  ROS: as per HPI. Denies CP, SOB, abd pain, N/V/D/C, or any other complaints at this time.  +low back pain- variable, but managable, +muscle spasms    Objective:   No results found.    No results for input(s): "WBC", "HGB", "HCT", "PLT" in the last 72 hours.          No results for input(s): "NA", "K", "CL", "CO2", "GLUCOSE", "BUN", "CREATININE", "CALCIUM" in the last 72 hours.           Intake/Output Summary (Last 24 hours) at 06/27/2023 1514 Last data filed at 06/27/2023 1326 Gross per 24 hour  Intake 936 ml  Output --  Net 936 ml           Physical Exam: Vital Signs Blood pressure 90/74, pulse 97, temperature 98.4 F (36.9 C), resp. rate 17, height 5\' 1"  (1.549 m), weight 60.5 kg, SpO2 97%.  Constitutional: No distress . Vital signs reviewed. Resting in bed, comfortable, wanting to go to the restroom.  HEENT: NCAT, EOMI, oral membranes moist Neck: supple Cardiovascular: RRR without murmur. No JVD    Respiratory/Chest: CTA Bilaterally without wheezes or rales. Normal effort GI/Abdomen: BS +, non-tender, non-distended, soft Ext: no clubbing, cyanosis, or edema Psych: pleasant and cooperative   Skin: + Scaling dried skin on bilateral heels Neurologic: alert, oriented to person, place. Fair insight Phonation and speech intelligability normal. STM deficits. Minimal hypertonicity RUE and RLE. Hypersensitivity to left hand improved, Left sided strength improved, decreased range of motion and pain at end range of motion in left shoulder, stable 2/12  Musculoskeletal:some limitations in right shoulder ROM, LB TTP     Assessment/Plan: 1. Functional deficits which require 3+ hours per day of interdisciplinary therapy in a  comprehensive inpatient rehab setting. Physiatrist is providing close team supervision and 24 hour management of active medical problems listed below. Physiatrist and rehab team continue to assess barriers to discharge/monitor patient progress toward functional and medical goals  Care Tool:  Bathing    Body parts bathed by patient: Face, Abdomen, Chest, Left upper leg, Right upper leg, Left arm, Front perineal area, Right arm, Right lower leg, Left lower leg, Buttocks   Body parts bathed by helper: Buttocks Body parts n/a: Right arm, Left arm, Front perineal area, Buttocks, Right upper leg, Left upper leg, Right lower leg, Left lower leg, Chest, Abdomen, Face   Bathing assist Assist Level: Supervision/Verbal cueing     Upper Body Dressing/Undressing Upper body dressing   What is the patient wearing?: Bra, Pull over shirt    Upper body assist Assist Level: Contact Guard/Touching assist    Lower Body Dressing/Undressing Lower body dressing      What is the patient wearing?: Underwear/pull up, Pants     Lower body assist Assist for lower body dressing: Supervision/Verbal cueing     Toileting Toileting Toileting Activity did not occur (Clothing management and hygiene only): N/A (no void or bm)  Toileting assist Assist for toileting: Supervision/Verbal cueing     Transfers Chair/bed transfer  Transfers assist  Chair/bed transfer activity did not occur: Safety/medical concerns  Chair/bed transfer assist level: Supervision/Verbal cueing Chair/bed transfer assistive device: Armrests, Geologist, engineering   Ambulation assist   Ambulation activity did not occur: Safety/medical concerns  Assist level: Contact Guard/Touching assist Assistive device: Walker-rolling Max distance: 300'   Walk 10 feet activity   Assist  Walk 10 feet activity did not occur: Safety/medical concerns  Assist level: Contact Guard/Touching assist Assistive device: Walker-rolling    Walk 50 feet activity   Assist Walk 50 feet with 2 turns activity did not occur: Safety/medical concerns  Assist level: Contact Guard/Touching assist Assistive device: Walker-rolling    Walk 150 feet activity   Assist Walk 150 feet activity did not occur: Safety/medical concerns  Assist level: Contact Guard/Touching assist Assistive device: Walker-rolling    Walk 10 feet on uneven surface  activity   Assist Walk 10 feet on uneven surfaces activity did not occur: Safety/medical concerns   Assist level: Contact Guard/Touching assist Assistive device: Walker-rolling   Wheelchair     Assist Is the patient using a wheelchair?: Yes Type of Wheelchair: Manual    Wheelchair assist level: Supervision/Verbal cueing Max wheelchair distance: 130ft    Wheelchair 50 feet with 2 turns activity    Assist    Wheelchair 50 feet with 2 turns activity did not occur: Safety/medical concerns   Assist Level: Supervision/Verbal cueing   Wheelchair 150 feet activity     Assist  Wheelchair 150 feet activity did not occur: Safety/medical concerns   Assist Level: Supervision/Verbal cueing   Blood pressure 90/74, pulse 97, temperature 98.4 F (36.9 C), resp. rate 17, height 5\' 1"  (1.549 m), weight 60.5 kg, SpO2 97%.  Medical Problem List and Plan: 1. 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 Awaiting approval of disability. SW has continued to work on placement. We also have engaged leadership within hospital for options. Code status changed to full code after obtaining patient  consent -therapies are daily only SW note reviewed and we do not have any accepting facilities thus far Discussed that we are waiting to hear from disability  2.  Impaired mobility: d/c lovenox since ambulating >150 feet, continue SCDs, discussed that she still cannot turn safely  3. Diffuse pains: resolved, gabapentin d/ced, lidocaine patches for back pain  4. History of anxiety: discussed with her mom that she feels this was a big risk factor for her accident, propanolol d/ced due to hypotension. Asked SW to place her on neuropsych list, Buspar ordered prn, continue this. Discussed placing outpatient referral for behavioral health f/u, discussed that Buspar has been helpful for her. Decreased buspar to daily prn, recreational therapy consulted  5. Impulsive, impaired cognition: renewed safety restraints. This patient is not capable of making decisions on her own behalf, messaged nursing to see if they feel she still needs restraints  6. Skin: eucerin cream BID -Buttock/sacrum--foam dressing, pressure relief, nutrition -stage 3 left heel, resolved  7. Fluids/Electrolytes/Nutrition: Routine in and outs with follow-up chemistries  -Eating  well with cueing   Upgraded to regular diet  -Vit D 4000U daily  8.  Cavitary right lower lobe pneumonia likely aspiration pneumonia/MRSA pneumonia.  Resolved    Latest Ref Rng & Units 05/30/2023    5:25 AM 05/16/2023    6:28 AM 05/02/2023    5:20 AM  CBC  WBC 4.0 - 10.5 K/uL 5.3  6.2  5.9   Hemoglobin 12.0 - 15.0 g/dL 91.4  78.2  95.6   Hematocrit 36.0 - 46.0 % 41.5  42.1  43.0   Platelets 150 - 400 K/uL 223  300  241      9.  History of drug abuse.  Positive cocaine on urine drug screen.  Provide counseling  10.  AKI/hypovolemia and ATN.  Resolved -BUN/creatinine stable 1/20  -Potassium 3.9 1/20  -1/24 will check cmet on monday    Latest Ref Rng & Units 05/30/2023    5:25 AM 05/16/2023    6:28 AM 05/02/2023    5:20 AM  BMP  Glucose  70 - 99 mg/dL 87  87  89   BUN 6 - 20 mg/dL 10  15  14    Creatinine 0.44 - 1.00 mg/dL 2.13  0.86  5.78   Sodium 135 - 145 mmol/L 137  136  138   Potassium 3.5 - 5.1 mmol/L 3.9  3.9  4.0   Chloride 98 - 111 mmol/L 107  105  109   CO2 22 - 32 mmol/L 25  22  23    Calcium 8.9 - 10.3 mg/dL 8.6  8.9  8.9     11.  Mild transaminitis with rhabdomyolysis.  Both resolved  12.  Hypotension:  D/c flomax, continue to monitor BP TID -2/1-9/25 BP fine/stable, cont monitoring  Vitals:   06/23/23 1944 06/24/23 0544 06/24/23 1405 06/24/23 2011  BP: 104/69 91/67 101/78 98/71   06/25/23 0400 06/25/23 1432 06/25/23 1929 06/26/23 0500  BP: 105/78 126/77 103/76 116/79   06/26/23 1405 06/26/23 2009 06/27/23 0507 06/27/23 1442  BP: 103/80 105/71 115/81 90/74     13. HTN: resolved  14. Impaired initiation: resolved, d/c amantadine  -Consider restart amantadine-- restarted 100mg  daily  15. Hyperglycemia: CBGs mildly elevated, d/ced checks. Prosource d/ced as per patient's request  16. Spasticity:   off baclofen and tizanidine- much improved-- back on baclofen  17.  Dyskinetic movements in head and neck -  -  resolved   18. Cervical extensor weakness: improved, continue strengthening exercises  19. Bowel and bladder incontinence: continue bowel and bladder program. Flomax started for urgency-- d/c'd -06/24/23 pt frustrated with incontinence/how long it takes her to try to urinate; discussed that she's had a variety of meds tried with no significant difference; continue trying timed toileting, but wonder if outpatient urology f/up for urodynamic studies would be beneficial?  20. Fatigue: resolved, d/c b complex   21. MRSA nares positive: negative on repeat, precautions d/ced  22. Tachycardic: resolved  23. S/p fall: CT head reviewed and is stable  24. Bilateral foot drop: bilateral AFOs ordered, discussed that these can help her to walk better, continue  25. Vaginal itching: resolved  26.  Dysuria resolved  27. Poor sitting balance: continue OT/PT, improving  28. Headaches: topamax 58m daily prn ordered, will d/c since not requiring  29. Drowsiness with tramadol: d/ced  30. IUD in place: Pt states she has IUD x14yrs  and believes it's supposed to be removed soon(chart review- Dr. Seymour Bars placed Mirena IUD on 07/01/19, good for 38yrs); needs outpatient follow-up with OB/GYN  31. Constipation: d/c baclofen, last BM 12/18, milk of magnesia  ordered 12/20  -LBM 1/27, restart magnesium gluconate 250mg  HS  Continue current regimen  -06/24/23 LBM yesterday, cont regimen  32. History of drug addiction: tramadol d/ced as per mom's preference  33. Depression: discussed scheduling for outpatient psychology/psychiatry follow-up, discussed neuropsych f/u while in hospital  -appreciate Dr. Marvetta Gibbons follow up  34. Sinusitis: claritin ordered prn, will d/c since not requiring  35. Pruritus: sarna lotion ordered, discussed that this is prn, will d/c since not requiring  26. Low back pain: XR ordered and discussed that it is negative, continue lidocaine patch, tramadol added prn, advised that she will have to ask for medication if she feels that she needs it, baclofen scheduled HS  27. Polypharmacy: amantadine d/ced since no changes noted with it, continue current medications  28. Insomnia: change melatonin to prn, continue  29. Muscle spasms: resolved, baclofen d/ced  LOS: 221 days A FACE TO FACE EVALUATION WAS PERFORMED  Nicole [name] Nicole [name] 06/27/2023, 3:14 PM

## 2023-06-27 NOTE — Patient Care Conference (Signed)
Inpatient RehabilitationTeam Conference and Plan of Care Update Date: 06/27/2023   Time: 11:03 AM    Patient Name: Nicole [name]      Medical Record Number: [id number]  Date of Birth: 1975-10-31 Sex: Female         Room/Bed: 4W09C/4W09C-01 Payor Info: Payor: [employer] / Plan: [employer] / Product Type: *No Product type* /    Admit Date/Time:  11/18/2022  3:15 PM  Primary Diagnosis:  Hypoxic brain injury [facility])  Hospital Problems: Principal Problem:   Hypoxic brain injury (HCC) Active Problems:   Protein-calorie malnutrition, severe   Essential hypertension   Pressure injury of skin   History of cocaine abuse (HCC)   Anxiety with depression    Expected Discharge Date: Expected Discharge Date:  (pending)  Team Members Present: Physician leading conference: Dr. Sula Soda Social Worker Present: Dossie Der, LCSW Nurse Present: Chana Bode, RN PT Present: Wynelle Link, PT OT Present: Bretta Bang, OT SLP Present: [name], SLP PPS Coordinator present : Fae Pippin, SLP     Current Status/Progress Goal Weekly Team Focus  Bowel/Bladder   Paient is Continent and incontinent, LBM 2/10.   Toilet  patient 2-4hrs while awake QS   Assess toileting needs and communicate provide supplement, softner or laxative within 2-3 days    Swallow/Nutrition/ Hydration               ADL's   Intermittent min assist   goals supervision - contact guard   Back pain, selective attention, problem ID/solving    Mobility   no change - CGA to supervision for functional mobility   Supervision/CGA  general mobility, dual-cog, safety awareness    Communication                Safety/Cognition/ Behavioral Observations  minA for functional tasks/modA for detailed/mildly complex tasks, though now tolerating introduction of more structured/complex tasks   s for functional tasks   functional problem solving, error awareness    Pain   Verbal pain in  mid and lower back ating 8/10, provided prn.refuse Kpad at this time   pain 2-3/10   Assess pain QS and prn    Skin   Skin is intact,continue to provide barrier cream and lotion prn   No  noted skin breakdown  Skin assess QS and prn      Discharge Planning:  Continue to await disability determination and then will have a source for payment. Koren Shiver is questioning if still payer source-will follow up with worker when figures it out   Team Discussion: Patient admitted post hypoxic brain injury. Patient continues to demonstrate slow improvement in function and is limited by selective attention, problem solving deficits poor carry over of strategies which impairs her ability to manage dual cognitive tasks. Noted return of athetoid/dyskinetic movement; MD adjusting Baclofen.  Patient on target to meet rehab goals: Continues to need intermittent assist for ADLs, CGA - min assist for functional mobility and min - mod assist for mildly complex structured tasks. Working on functional problem solving and error awareness.  *See Care Plan and progress notes for long and short-term goals.   Revisions to Treatment Plan:  N/a   Teaching Needs: Safety, medications, skin care, transfers, toileting, etc.   Current Barriers to Discharge: Incontinence and Lack of/limited family support  Possible Resolutions to Barriers: SNF pending Long term disability pending     Medical Summary Current Status: muscle spasms, low back pain, athetoid movements  Barriers to Discharge:  Medical stability  Barriers to Discharge Comments: muscle spasms, low back pain, athetoid movements Possible Resolutions to [employer] Focus: will d/c baclofen since muslce spasms have improved, continue lidocaine   Continued Need for Acute Rehabilitation Level of Care: The patient requires daily medical management by a physician with specialized training in physical medicine and rehabilitation for the following  reasons: Direction of a multidisciplinary physical rehabilitation program to maximize functional independence : Yes Medical management of patient stability for increased activity during participation in an intensive rehabilitation regime.: Yes Analysis of laboratory values and/or radiology reports with any subsequent need for medication adjustment and/or medical intervention. : Yes   I attest that I was present, lead the team conference, and concur with the assessment and plan of the team.   Chana Bode B 06/27/2023, 2:26 PM

## 2023-06-28 NOTE — Progress Notes (Signed)
Occupational Therapy Session Note  Patient Details  Name: Nicole [name] MRN: [id number] Date of Birth: Nov 14, 1975  Today's Date: 06/28/2023 OT Individual Time: 0733 - 0800  OT Individual Treatment Time:   27 min   Short Term Goals: Week 1:  OT Short Term Goal 1 (Week 1): Patient will brush hair and put into ponytail with no more than set up assistance OT Short Term Goal 1 - Progress (Week 1): Other (comment) OT Short Term Goal 2 (Week 1): Patient will complete simple snack prep at ambulation level and close supervision OT Short Term Goal 2 - Progress (Week 1): Other (comment) OT Short Term Goal 3 (Week 1): Patient will reach with LUE over 110* shoulder flexion without lateral trunk lean to obtain a lightweight object from shelf. OT Short Term Goal 3 - Progress (Week 1): Other (comment) OT Short Term Goal 4 (Week 1): xxx OT Short Term Goal 4 - Progress (Week 1): Other (comment) OT Short Term Goal 5 (Week 1): xxx OT Short Term Goal 5 - Progress (Week 1): Other (comment)  Skilled Therapeutic Interventions/Progress Updates:    Patient received supine in bed, NT attending to patient.  Patient recently gfinished breakfast.  Eager to walk to bathroom, void and shower.  Walking with rolling walker and supervision - approaching distant supervision in familiar environment.  Emphasis of OT session on improving patient's ability to remain focused and self organize for familiar self care tasks.  Overtly working to reduce cueing and to move toward simple open ended questions - e.g. "what's next?"  Patient showing improved logical sequencing.  Left up in wheelchair at sink with chair pad alarm engaged.    Therapy Documentation Precautions:  Precautions Precautions: Fall Precaution/Restrictions Comments: decreased stand balance- reliant on UE, Pain in LLE limits mobility at times Required Braces or Orthoses: Other Brace Other Brace: B adjustable night splints; B elbow "cosey" splints; L palm protector; R  mitt Restrictions Weight Bearing Restrictions Per Provider Order: No  Pain:  None indicated      Therapy/Group: Individual Therapy  [name] 06/28/2023, 12:05 PM

## 2023-06-28 NOTE — Progress Notes (Signed)
Physical Therapy Session Note  Patient Details  Name: Nicole [name] MRN: [id number] Date of Birth: March 02, 1976  Today's Date: 06/28/2023 PT Individual Time: 1130-1159 PT Individual Time Calculation (min): 29 min   Short Term Goals: Week 1:  PT Short Term Goal 1 (Week 1): Pt will perform supine<>sit with supervision PT Short Term Goal 1 - Progress (Week 1): Other (comment) (new goal) PT Short Term Goal 2 (Week 1): Pt will ambulate at least 176ft with no more than min A, not using an AD. PT Short Term Goal 2 - Progress (Week 1): Other (comment) (new goal) PT Short Term Goal 3 (Week 1): Pt will demonstrate decreased fall risk as noted by an improvement of at least 7 points on the [employer] Test PT Short Term Goal 3 - Progress (Week 1): Other (comment) (new goal) PT Short Term Goal 4 (Week 1): Pt will navigate 4 steps using HRs with no more than CGA  Skilled Therapeutic Interventions/Progress Updates:      Pt sitting in wheelchair to start - no reports of pain during treatment. Sit<>Stand to RW with suprevision and ambulates at supervision level using the RW to day room rehab gym. Gym space setup for therapeutic activity for Valentines Day - patient participated in card making, puzzles, word finding, and word scrambling activities. Patient with poor performance 2/2 busy gym environment and decreased selective attention. Mind/mod cues for attention, problem solving, and complex processing. Returned to her room at end of treatment; left with all her needs met.  Therapy Documentation Precautions:  Precautions Precautions: Fall Precaution/Restrictions Comments: decreased stand balance- reliant on UE, Pain in LLE limits mobility at times Required Braces or Orthoses: Other Brace Other Brace: B adjustable night splints; B elbow "cosey" splints; L palm protector; R mitt Restrictions Weight Bearing Restrictions Per Provider Order: No General:       Therapy/Group: Individual  Therapy  Orrin Brigham 06/28/2023, 8:38 AM

## 2023-06-28 NOTE — Plan of Care (Signed)
  Problem: Consults Goal: RH STROKE PATIENT EDUCATION Description: See Patient Education module for education specifics  Outcome: Progressing   Problem: RH BOWEL ELIMINATION Goal: RH STG MANAGE BOWEL WITH ASSISTANCE Description: STG Manage Bowel with mod I Assistance. Outcome: Progressing Goal: RH STG MANAGE BOWEL W/MEDICATION W/ASSISTANCE Description: STG Manage Bowel with Medication with mod I Assistance. Outcome: Progressing   Problem: RH BLADDER ELIMINATION Goal: RH STG MANAGE BLADDER WITH ASSISTANCE Description: STG Manage Bladder With toileting Assistance Outcome: Progressing   Problem: RH SKIN INTEGRITY Goal: RH STG MAINTAIN SKIN INTEGRITY WITH ASSISTANCE Description: STG Maintain Skin Integrity With min  Assistance. Outcome: Progressing

## 2023-06-28 NOTE — Progress Notes (Signed)
Physical Therapy Session Note  Patient Details  Name: Nicole [name] MRN: [id number] Date of Birth: July 23, 1975  Today's Date: 06/28/2023 PT Individual Time: 0815-0830 PT Individual Time Calculation (min): 15 min   Short Term Goals: Week 1:  PT Short Term Goal 1 (Week 1): Pt will perform supine<>sit with supervision PT Short Term Goal 1 - Progress (Week 1): Other (comment) (new goal) PT Short Term Goal 2 (Week 1): Pt will ambulate at least 171ft with no more than min A, not using an AD. PT Short Term Goal 2 - Progress (Week 1): Other (comment) (new goal) PT Short Term Goal 3 (Week 1): Pt will demonstrate decreased fall risk as noted by an improvement of at least 7 points on the [employer] Test PT Short Term Goal 3 - Progress (Week 1): Other (comment) (new goal) PT Short Term Goal 4 (Week 1): Pt will navigate 4 steps using HRs with no more than CGA  Skilled Therapeutic Interventions/Progress Updates:      Pt sitting in wheelchair, LPN providing lidocaine patches for her back. Patient reports mild back pain and requests to use the bathroom before leaving her room. Ambulatory transfer with CGA and no AD to toilet. Patient needing min cues for organizing/sequencing removing dirty brief/pants and then applying clean brief (incontinent of urine). minA for LB dressing. Functional mobility training, hallway distances, with supervision and RW >573ft. Min cues for safety awareness and RW management. Ended treatment with direct handoff of care to SLP in hallway.   Therapy Documentation Precautions:  Precautions Precautions: Fall Precaution/Restrictions Comments: decreased stand balance- reliant on UE, Pain in LLE limits mobility at times Required Braces or Orthoses: Other Brace Other Brace: B adjustable night splints; B elbow "cosey" splints; L palm protector; R mitt Restrictions Weight Bearing Restrictions Per Provider Order: No General:     Therapy/Group: Individual Therapy  Orrin Brigham 06/28/2023, 7:48 AM

## 2023-06-28 NOTE — Progress Notes (Signed)
Speech Language Pathology Daily Session Note  Patient Details  Name: Nicole [name] MRN: [id number] Date of Birth: June 16, 1975  Today's Date: 06/28/2023 SLP Individual Time: 0830-0900 SLP Individual Time Calculation (min): 30 min  Short Term Goals: Week 9: SLP Short Term Goal 1 (Week 9): Week 31 - Pt will solve mildly complex environmental problems with 75% accuracy given min assist. SLP Short Term Goal 2 (Week 9): Week 31 - Pt will demonstrate emergent awareness of mistakes during functional environmental tasks and correct mistakes in 4/5 opportunities given min assist.  Skilled Therapeutic Interventions:   Pt greeted in the hallway, as she was walking back from PT tx session. Pt ambulated to the ST office independently via rolling walker. Once in the office, SLP facilitated mildly complex decoding task. She required only minA visual/verbal cues to maintain slow rate and attention to details. She then completed a functional time management task re her daily schedule, benefiting from minA cues for information processing and calculations. Pt independently ambulated from the ST office to her room and demonstrated adequate safety awareness/reasoning during transfer back to her wheelchair. Call light left within reach. Recommend cont ST per POC.   Pain  No pain reported   Therapy/Group: Individual Therapy  [name] 06/28/2023, 8:44 AM

## 2023-06-29 NOTE — Progress Notes (Signed)
Speech Language Pathology Weekly Progress and Session Note  Patient Details  Name: Nicole [name] MRN: [id number] Date of Birth: May 21, 1975  Beginning of progress report period: June 22, 2023 End of progress report period: June 29, 2023  Today's Date: 06/29/2023 SLP Individual Time: 1434-1530 SLP Individual Time Calculation (min): 56 min  Short Term Goals: Week 9: SLP Short Term Goal 1 (Week 9): Week 31 - Pt will solve mildly complex environmental problems with 75% accuracy given min assist. SLP Short Term Goal 1 - Progress (Week 9): Met SLP Short Term Goal 2 (Week 9): Week 31 - Pt will demonstrate emergent awareness of mistakes during functional environmental tasks and correct mistakes in 4/5 opportunities given min assist. SLP Short Term Goal 2 - Progress (Week 9): Met  New Short Term Goals: Week 9: SLP Short Term Goal 1 (Week 9): Week 32 - Pt will solve mildly complex environmental problems with 80% accuracy given min assist. SLP Short Term Goal 2 (Week 9): Week 32 - Pt will demonstrate emergent awareness of mistakes during functional environmental tasks and correct mistakes in 5/6 opportunities given min assist.  Weekly Progress Updates: Patient continues to make steady gains towards therapy goals, meeting 2 out of 2 short term goals set this reporting period. Patient currently solves mildly complex problems with 75% accuracy given min assist and demonstrates emergent awareness of mistakes in 4/5 opportunities given min assist, though can require up to mod for mistake correction and sequencing. Patient and family education ongoing. Patient will continue to benefit from skilled therapy services during remainder of CIR stay.    Intensity: Minumum of 1-2 x/day, 30 to 90 minutes Frequency: 3 to 5 out of 7 days Duration/Length of Stay: TBD due to placement Treatment/Interventions: Cognitive remediation/compensation;Internal/external aids;Speech/Language facilitation;Cueing  hierarchy;Environmental controls;Therapeutic Activities;Functional tasks;Multimodal communication approach;Patient/family education;Therapeutic Exercise  Daily Session  Skilled Therapeutic Interventions: SLP conducted skilled therapy session targeting cognitive retraining goals. Patient and SLP targeted cognition during functional mobility tasks. Patient navigated spaces with rolling walker and min cues for sequencing/avoiding obstacles. Patient required increased processing time to sequence turning and sitting. Patient completed money math card tasks with min assist overall. During visuospatial/drawing tasks, she required mod cues for sequencing, self-correcting, and impulsivity. Patient was left in lowered bed with call bell in reach and bed alarm set. SLP will continue to target goals per plan of care.      Therapy/Group: Individual Therapy  [name], M.A., CCC-SLP  [name] 06/29/2023, 7:47 AM

## 2023-06-29 NOTE — Progress Notes (Signed)
Physical Therapy Session Note  Patient Details  Name: Nicole [name] MRN: [id number] Date of Birth: 06-Sep-1975  Today's Date: 06/29/2023 PT Individual Time: 1302-1330 PT Individual Time Calculation (min): 28 min   Short Term Goals: Week 1:  PT Short Term Goal 1 (Week 1): Pt will perform supine<>sit with supervision PT Short Term Goal 1 - Progress (Week 1): Other (comment) (new goal) PT Short Term Goal 2 (Week 1): Pt will ambulate at least 128ft with no more than min A, not using an AD. PT Short Term Goal 2 - Progress (Week 1): Other (comment) (new goal) PT Short Term Goal 3 (Week 1): Pt will demonstrate decreased fall risk as noted by an improvement of at least 7 points on the [employer] Test PT Short Term Goal 3 - Progress (Week 1): Other (comment) (new goal) PT Short Term Goal 4 (Week 1): Pt will navigate 4 steps using HRs with no more than CGA  Skilled Therapeutic Interventions/Progress Updates:      Pt sitting in wheelchair to start - agreeable to PT tx. Denies any pain. Sit<>Stand to RW with supervision - ambulates >438ft within CIR floor with min cues for safety awareness and RW management. Completed mat table stretching for hip abductors/adductors, hip flexors, and hamstrings. All PROM to tolerance. Returned to her room - patient requesting a coffee - warm coffee (ice added for safety) provided. All needs met at end with call bell within reach.  Therapy Documentation Precautions:  Precautions Precautions: Fall Precaution/Restrictions Comments: decreased stand balance- reliant on UE, Pain in LLE limits mobility at times Required Braces or Orthoses: Other Brace Other Brace: B adjustable night splints; B elbow "cosey" splints; L palm protector; R mitt Restrictions Weight Bearing Restrictions Per Provider Order: No General:    Therapy/Group: Individual Therapy  Aamani Moose P Veasna Santibanez 06/29/2023, 1:29 PM

## 2023-06-29 NOTE — Progress Notes (Signed)
Occupational Therapy Session Note  Patient Details  Name: Nicole [name] MRN: [id number] Date of Birth: 16-Sep-1975  Today's Date: 06/29/2023 OT Individual Time: 0900-0930 OT Individual Time Calculation (min): 30 min    Short Term Goals: Week 1:  OT Short Term Goal 1 (Week 1): Patient will brush hair and put into ponytail with no more than set up assistance OT Short Term Goal 1 - Progress (Week 1): Other (comment) OT Short Term Goal 2 (Week 1): Patient will complete simple snack prep at ambulation level and close supervision OT Short Term Goal 2 - Progress (Week 1): Other (comment) OT Short Term Goal 3 (Week 1): Patient will reach with LUE over 110* shoulder flexion without lateral trunk lean to obtain a lightweight object from shelf. OT Short Term Goal 3 - Progress (Week 1): Other (comment) OT Short Term Goal 4 (Week 1): xxx OT Short Term Goal 4 - Progress (Week 1): Other (comment) OT Short Term Goal 5 (Week 1): xxx OT Short Term Goal 5 - Progress (Week 1): Other (comment)  Skilled Therapeutic Interventions/Progress Updates:   Patient received supine in bed, holding banana in left hand seemingly unaware, holding Valentine's card for her mom in Right hand.  Patient very pleased in making a card for her mom.  Patient with syrup all over bed, and her person and agreeable for shower.  Walked to bathroom for continent BM, able to complete hygiene with cueing - patient asking for help, assured she could complete herself.  Showered with cueing for efficiency.  Patient perseverates on axilla - "smell like a polecat."  Patient struggling to organize legs into leggings R/L - needing min assist.  Patient unable to recall adapted technique to don bra today, until she was unable to hook it herself.  Decreased attention to familiar functional task in general this session.  Left up in wheelchair with chair pad alarm engaged at sink to complete hair care, and oral care.  Re  Therapy Documentation Precautions:   Precautions Precautions: Fall Precaution/Restrictions Comments: decreased stand balance- reliant on UE, Pain in LLE limits mobility at times Required Braces or Orthoses: Other Brace Other Brace: B adjustable night splints; B elbow "cosey" splints; L palm protector; R mitt Restrictions Weight Bearing Restrictions Per Provider Order: No   Pain:  Reports pain "bad" in low back.  Lidocaine patches in place.       Therapy/Group: Individual Therapy  [name] 06/29/2023, 12:36 PM

## 2023-06-29 NOTE — Progress Notes (Signed)
Recreational Therapy Session Note  Patient Details  Name: Nicole [name] MRN: [id number] Date of Birth: [birth year] Today's Date: 06/29/2023  Pain: no c/o Skilled Therapeutic Interventions/Progress Updates:    Goal:  Pt will complete simple tabletop craft with only set up assistance.  Not met Pt provided with the needed supplies to complete a simple tabletop craft painting a wooden [employer].  Pt required min cues for problem solving.  Pt stated she enjoyed the activity and requested additional Durel Salts themed activities for use in the room.  Pt propelled w/c throughout the dayroom to retrieve the requested items with supervision.  Pt did require extra time to coordinate/correct w/c mobilty but was able to do so without assistance.  Therapy/Group: Individual Therapy  Diandre Merica 06/29/2023, 12:04 PM
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
## 2023-06-30 NOTE — Progress Notes (Signed)
Slept good. PRN melatonin given at 2109. Incontinent of urine x 2 during night. LBM 02/14. Wore SCD's most of night. Independent with turning in bed. Denies pain. Nicole [name] A
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
## 2023-06-30 NOTE — Plan of Care (Signed)
  Problem: Consults Goal: RH STROKE PATIENT EDUCATION Description: See Patient Education module for education specifics  Outcome: Progressing   Problem: RH BOWEL ELIMINATION Goal: RH STG MANAGE BOWEL WITH ASSISTANCE Description: STG Manage Bowel with mod I Assistance. Outcome: Progressing Goal: RH STG MANAGE BOWEL W/MEDICATION W/ASSISTANCE Description: STG Manage Bowel with Medication with mod I Assistance. Outcome: Progressing   Problem: RH BLADDER ELIMINATION Goal: RH STG MANAGE BLADDER WITH ASSISTANCE Description: STG Manage Bladder With toileting Assistance Outcome: Progressing   Problem: RH SKIN INTEGRITY Goal: RH STG MAINTAIN SKIN INTEGRITY WITH ASSISTANCE Description: STG Maintain Skin Integrity With min  Assistance. Outcome: Progressing

## 2023-07-01 NOTE — Plan of Care (Signed)
  Problem: Consults Goal: RH STROKE PATIENT EDUCATION Description: See Patient Education module for education specifics  Outcome: Progressing   Problem: RH BOWEL ELIMINATION Goal: RH STG MANAGE BOWEL WITH ASSISTANCE Description: STG Manage Bowel with mod I Assistance. Outcome: Progressing Goal: RH STG MANAGE BOWEL W/MEDICATION W/ASSISTANCE Description: STG Manage Bowel with Medication with mod I Assistance. Outcome: Progressing   Problem: RH BLADDER ELIMINATION Goal: RH STG MANAGE BLADDER WITH ASSISTANCE Description: STG Manage Bladder With toileting Assistance Outcome: Progressing   Problem: RH SKIN INTEGRITY Goal: RH STG MAINTAIN SKIN INTEGRITY WITH ASSISTANCE Description: STG Maintain Skin Integrity With min  Assistance. Outcome: Progressing
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
## 2023-07-02 MED ORDER — MIRABEGRON ER 25 MG PO TB24
25.0000 mg | ORAL_TABLET | Freq: Every day | ORAL | Status: DC
  Administered 2023-07-02 – 2023-07-19 (×17): 25 mg via ORAL
  Filled 2023-07-02 (×18): qty 1

## 2023-07-02 NOTE — Progress Notes (Signed)
Occupational Therapy Session Note  Patient Details  Name: Nicole [name] MRN: [id number] Date of Birth: 07/22/1975  Today's Date: 07/02/2023 OT Individual Time: 0832-0900 OT Individual Time Calculation (min): 28 min    Short Term Goals: Week 4:  OT Short Term Goal 1 (Week 4): Patient will motor plan to transition from supine to sitting from flat surface with min assist OT Short Term Goal 1 - Progress (Week 4): Met OT Short Term Goal 2 (Week 4): Patient will OT Short Term Goal 2 - Progress (Week 4): Other (comment) OT Short Term Goal 3 (Week 4): Patient will don left sock with min assist OT Short Term Goal 3 - Progress (Week 4): Met OT Short Term Goal 4 (Week 4): Patient will trial wearing underwear during weekday x 2 OT Short Term Goal 4 - Progress (Week 4): Partly met OT Short Term Goal 5 (Week 4): Patient will grab hair to place in ponytail with BUE OT Short Term Goal 5 - Progress (Week 4): Partly met  Skilled Therapeutic Interventions/Progress Updates:    Pt sitting EOB at time of session, no reports of pain, just finished PT. Pt wanting shower but agreeable to OT session in afternoon for longer session, other ADL needs met at this time. Functional mobility with HHA for R hand and walked to/from therapy gym with MIN A, focused on seated core strength with 3# ball for overhead raise, throws, hand offs, torso twists, etc, 3# dumbbells downgraded to single dumbbell for B hand hold for chest press and overhead press as well. Note multimodal cues for form and cues for pacing. Back in room bed mobility MIN A, alarm on call bell in reach.   Therapy Documentation Precautions:  Precautions Precautions: Fall Precaution/Restrictions Comments: decreased stand balance- reliant on UE, Pain in LLE limits mobility at times Required Braces or Orthoses: Other Brace Other Brace: B adjustable night splints; B elbow "cosey" splints; L palm protector; R mitt Restrictions Weight Bearing Restrictions Per  Provider Order: No     Therapy/Group: Individual Therapy  Erasmo Score 07/02/2023, 7:21 AM
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
## 2023-07-02 NOTE — Progress Notes (Signed)
Restless night. Calling every 1 to 2 hours to go to [facility]. Continent & incontinent of urine. Needing more cues to initiate or complete tasks. PRN melatonin given at 2123. PRN buspar given at 0109, feeling anxious, because of restlessness. At 0301, prn ultram given complaining of lower back pain. Nicole [name] A

## 2023-07-02 NOTE — Progress Notes (Signed)
Speech Language Pathology Daily Session Note  Patient Details  Name: WENDEE HATA MRN: [id number] Date of Birth: 30-Jun-1975  Today's Date: 07/02/2023 SLP Individual Time: 3086-5784 SLP Individual Time Calculation (min): 26 min  Short Term Goals: Week 9: SLP Short Term Goal 1 (Week 9): Week 32 - Pt will solve mildly complex environmental problems with 80% accuracy given min assist. SLP Short Term Goal 2 (Week 9): Week 32 - Pt will demonstrate emergent awareness of mistakes during functional environmental tasks and correct mistakes in 5/6 opportunities given min assist.  Skilled Therapeutic Interventions: SLP conducted skilled therapy session targeting cognitive retraining goals. SLP facilitated handwriting practice to target impulsivity. Patient required min assist for emergent awareness of mistakes during task and max verbal cues to utilize strategies to promote increased legibility.  At end of session, patient requested to move to the bed from the chair and required min verbal cues and extra processing time to sequence turns and transfers. Patient was left in lowered bed with call bell in reach and bed alarm set. SLP will continue to target goals per plan of care.       Pain Pain Assessment Pain Scale: 0-10 Pain Score: 0-No pain  Therapy/Group: Individual Therapy  [name], M.A., CCC-SLP  [name] 07/02/2023, 3:52 PM

## 2023-07-02 NOTE — Progress Notes (Signed)
Physical Therapy Session Note  Patient Details  Name: CHERISH RUNDE MRN: [id number] Date of Birth: 1975-12-26  Today's Date: 07/02/2023 PT Individual Time: 0802-0830 PT Individual Time Calculation (min): 28 min   Short Term Goals: Week 1:  PT Short Term Goal 1 (Week 1): Pt will perform supine<>sit with supervision PT Short Term Goal 1 - Progress (Week 1): Other (comment) (new goal) PT Short Term Goal 2 (Week 1): Pt will ambulate at least 146ft with no more than min A, not using an AD. PT Short Term Goal 2 - Progress (Week 1): Other (comment) (new goal) PT Short Term Goal 3 (Week 1): Pt will demonstrate decreased fall risk as noted by an improvement of at least 7 points on the [employer] Test PT Short Term Goal 3 - Progress (Week 1): Other (comment) (new goal) PT Short Term Goal 4 (Week 1): Pt will navigate 4 steps using HRs with no more than CGA  Skilled Therapeutic Interventions/Progress Updates:      Pt greeted in bed to start - has no c/o pain. Agreeable to PT tx. Pt requesting to get dressed, completed oral care, and get ready for the day. She completes LB dressing at bed level with setupA. Bed mobility completed with supervision using hospital bed features. She was able to don tennis shoes with setupA - able to recognize when wrong foot placed in wrong shoe. Sit<>stands completed with CGA and no AD. She continues to have disorganized thought and sequencing while getting ready for the day - scattered b/w dressing, oral care, and hand washing. Pt reaching for objects on the ground, needing minA for balance to prevent LOB anteriorly.   In rehab gym, worked on dynamic gait and reaching for objects at ground level from standing. Pt completed weaving in/out of cones spaced ~6ft apart, frequently tripping or knocking over cones and needing minA for stability. She also required minA for reaching and grabbing cones from ground level.   Returned to her room and patient left sitting EOB with bed  alarm on. Call bell and all needs met. Pt aware of upcoming therapy schedule.   Therapy Documentation Precautions:  Precautions Precautions: Fall Precaution/Restrictions Comments: decreased stand balance- reliant on UE, Pain in LLE limits mobility at times Required Braces or Orthoses: Other Brace Other Brace: B adjustable night splints; B elbow "cosey" splints; L palm protector; R mitt Restrictions Weight Bearing Restrictions Per Provider Order: No General:     Therapy/Group: Individual Therapy  Orrin Brigham 07/02/2023, 7:54 AM

## 2023-07-02 NOTE — Progress Notes (Signed)
Occupational Therapy Session Note  Patient Details  Name: Nicole [name] MRN: [id number] Date of Birth: 09-11-1975  Today's Date: 07/02/2023 OT Individual Time: 1306-1405 OT Individual Time Calculation (min): 59 min    Short Term Goals: Week 9: OT Short Term Goal 1 (Week 9): Patient will walk to bathroom without device and close supervision OT Short Term Goal 1 - Progress (Week 9): Progressing toward goal OT Short Term Goal 2 (Week 9): Patient will don bra with no more than 3 verbal cues OT Short Term Goal 2 - Progress (Week 9): Met OT Short Term Goal 3 (Week 9): Patient will complete simple meal prep with min assist OT Short Term Goal 3 - Progress (Week 9): Not met OT Short Term Goal 4 (Week 9): xxx OT Short Term Goal 4 - Progress (Week 9): Other (comment) OT Short Term Goal 5 (Week 9): xxx OT Short Term Goal 5 - Progress (Week 9): Other (comment)  Skilled Therapeutic Interventions/Progress Updates:  Pt greeted supine in bed, pt agreeable to OT intervention.    Pt requested to shower  Transfers/bed mobility/functional mobility:  Pt completed supine<>sit MODI with use of bed features. Pt completed ambulatory ADL transfers with RW and supervision.  Therapeutic activity: pt completed standing working memory task at [employer] with pt shown sequence of 5 words with words then transitioning to images placed in random order. Pt instructed to tap images in correct order. Pt completed task with 11% accuracy with pt only getting one sequence correct out of 9. Pt completed task with no UE support with supervision.    ADLs:  Grooming: pt completed standing grooming at sink with supervision, pt stating " time me" with pt wanting to stand for 5 mins. Pt able to stand for 6 mins UB dressing:pt donned [place] shirt with set- up assist  LB dressing: pt donned pants with CGA for balance Footwear: pt donned socks via figure 4 with set- up assist   Bathing: pt completed bathing from sitting/standing with use of  grab bars and supervision, emphasis on directing level of care with pt asking appropriately for needed items I.e fresh wash cloths or asking for assistance to make sure all conditioner was rinsed out to  improve overall problem solving and safety during ADLs.  Transfers: ambulatory toilet and shower transfer with RW and supervision.  Toileting: pt completed 3/3 toileting tasks with supervision for safety, although pt often stops and stares at therapist as if looking to see what to do next.    Ended session with pt seated in chair with all needs within reach.               Therapy Documentation Precautions:  Precautions Precautions: Fall Precaution/Restrictions Comments: decreased stand balance- reliant on UE, Pain in LLE limits mobility at times Required Braces or Orthoses: Other Brace Other Brace: B adjustable night splints; B elbow "cosey" splints; L palm protector; R mitt Restrictions Weight Bearing Restrictions Per Provider Order: No  Pain: no pain reported during session     Therapy/Group: Individual Therapy  Barron Schmid 07/02/2023, 2:10 PM

## 2023-07-02 NOTE — Plan of Care (Signed)
  Problem: Consults Goal: RH STROKE PATIENT EDUCATION Description: See Patient Education module for education specifics  Outcome: Progressing   Problem: RH BOWEL ELIMINATION Goal: RH STG MANAGE BOWEL WITH ASSISTANCE Description: STG Manage Bowel with mod I Assistance. Outcome: Progressing Goal: RH STG MANAGE BOWEL W/MEDICATION W/ASSISTANCE Description: STG Manage Bowel with Medication with mod I Assistance. Outcome: Progressing   Problem: RH BLADDER ELIMINATION Goal: RH STG MANAGE BLADDER WITH ASSISTANCE Description: STG Manage Bladder With toileting Assistance Outcome: Progressing   Problem: RH SKIN INTEGRITY Goal: RH STG MAINTAIN SKIN INTEGRITY WITH ASSISTANCE Description: STG Maintain Skin Integrity With min  Assistance. Outcome: Progressing

## 2023-07-02 NOTE — Progress Notes (Signed)
Physical Therapy Session Note  Patient Details  Name: NICHELE SLAWSON MRN: [id number] Date of Birth: 1975-11-12  Today's Date: 07/02/2023 PT Individual Time: 1500-1530 PT Individual Time Calculation (min): 30 min   Short Term Goals: Week 1:  PT Short Term Goal 1 (Week 1): Pt will perform supine<>sit with supervision PT Short Term Goal 1 - Progress (Week 1): Other (comment) (new goal) PT Short Term Goal 2 (Week 1): Pt will ambulate at least 145ft with no more than min A, not using an AD. PT Short Term Goal 2 - Progress (Week 1): Other (comment) (new goal) PT Short Term Goal 3 (Week 1): Pt will demonstrate decreased fall risk as noted by an improvement of at least 7 points on the [employer] Test PT Short Term Goal 3 - Progress (Week 1): Other (comment) (new goal) PT Short Term Goal 4 (Week 1): Pt will navigate 4 steps using HRs with no more than CGA  Skilled Therapeutic Interventions/Progress Updates:     Direct handoff of care from NT to start. Patient sitting EOB and agreeable to PT tx. Pt requests to use the restroom before leaving her room. Ambulates with CGA to close supervision with no AD to the toilet. Pt with a wet brief already - able  to remove and don a clean one with setupA. Donns pants backwards and needs cues for awareness. Ambulated to day room rehab gym at [facility] level of assist without the use of AD. Worked on activity tolerance and gait training on the treadmill. MinA for stepping on/off treadmill. She completed x6 minutes of continuous gait with speed varying from 1.5-2.0 mph with BUE support and CGA for safety. Used incline up to L7 on treadmill. She covered a total of 831ft in the 6 minutes, distance limited by fatigue. Pt required seated rest break before returning to her room. Left sitting EOB with all her needs met, bed alarm on.   Therapy Documentation Precautions:  Precautions Precautions: Fall Precaution/Restrictions Comments: decreased stand balance- reliant on UE, Pain  in LLE limits mobility at times Required Braces or Orthoses: Other Brace Other Brace: B adjustable night splints; B elbow "cosey" splints; L palm protector; R mitt Restrictions Weight Bearing Restrictions Per Provider Order: No General:      Therapy/Group: Individual Therapy  Orrin Brigham 07/02/2023, 3:31 PM

## 2023-07-03 NOTE — Progress Notes (Signed)
Occupational Therapy Session Note  Patient Details  Name: EDDIE KOC MRN: [id number] Date of Birth: [birth year]  Today's Date: 07/03/2023 OT Individual Time: 0930-1000 OT Individual Time Calculation (min): 30 min    Short Term Goals: OT Short Term Goal 1 (Week 1): Patient will brush hair and put into ponytail with no more than set up assistance OT Short Term Goal 1 - Progress (Week 1): Other (comment) OT Short Term Goal 2 (Week 1): Patient will complete simple snack prep at ambulation level and close supervision OT Short Term Goal 2 - Progress (Week 1): Other (comment) OT Short Term Goal 3 (Week 1): Patient will reach with LUE over 110* shoulder flexion without lateral trunk lean to obtain a lightweight object from shelf.  Skilled Therapeutic Interventions/Progress Updates:   Pt seen for skilled OT session with focus on L UE NMRE goal within functional context. Pt amb to and from bathroom with close S and cues with RW but did not need to void or have BM. Min cues to maintain RW alignment and safety negotiating objects in room. Pt stood for reaching for hair items sinkside with L UE with cues and close S. Seated 3 trial attempt to put her hair up with increased time and unsuccessful attempt to get hair secured. Amb with S and min cues with Rw to and from day room. Pt performed [employer] as memory test and challenge with theraputty component for L hand strength. More trial and error to match shapes from basic recall with 6 pairs. Left pt w/c level with chair alarm, needs and nurse call button in reach.    Pain: back pain with request for pain meds to nursing   Therapy Documentation Precautions:  Precautions Precautions: Fall Precaution/Restrictions Comments: decreased stand balance- reliant on UE, Pain in LLE limits mobility at times Required Braces or Orthoses: Other Brace Other Brace: B adjustable night splints; B elbow "cosey" splints; L palm protector; R mitt Restrictions Weight  Bearing Restrictions Per Provider Order: No   Therapy/Group: Individual Therapy  Vicenta Dunning 07/03/2023, 7:57 AM

## 2023-07-03 NOTE — Plan of Care (Signed)
  Problem: RH SKIN INTEGRITY Goal: RH STG MAINTAIN SKIN INTEGRITY WITH ASSISTANCE Description: STG Maintain Skin Integrity With min  Assistance. Outcome: Progressing   Problem: RH BLADDER ELIMINATION Goal: RH STG MANAGE BLADDER WITH ASSISTANCE Description: STG Manage Bladder With toileting Assistance Outcome: Progressing   Problem: RH BOWEL ELIMINATION Goal: RH STG MANAGE BOWEL W/MEDICATION W/ASSISTANCE Description: STG Manage Bowel with Medication with mod I Assistance. Outcome: Progressing   Problem: RH BOWEL ELIMINATION Goal: RH STG MANAGE BOWEL WITH ASSISTANCE Description: STG Manage Bowel with mod I Assistance. Outcome: Progressing   Problem: Consults Goal: RH STROKE PATIENT EDUCATION Description: See Patient Education module for education specifics  Outcome: Progressing

## 2023-07-03 NOTE — Progress Notes (Signed)
Applied new preventive dressing this morning to bilateral heels and coccyx. Tolerated well.

## 2023-07-03 NOTE — Progress Notes (Signed)
Physical Therapy Session Note  Patient Details  Name: Nicole [name] MRN: [id number] Date of Birth: June 02, 1975  Today's Date: 07/03/2023 PT Individual Time: 4782-9562 + 1400-1445 PT Individual Time Calculation (min): 35 min + 45 min  Short Term Goals: Week 1:  PT Short Term Goal 1 (Week 1): Pt will perform supine<>sit with supervision PT Short Term Goal 1 - Progress (Week 1): Other (comment) (new goal) PT Short Term Goal 2 (Week 1): Pt will ambulate at least 157ft with no more than min A, not using an AD. PT Short Term Goal 2 - Progress (Week 1): Other (comment) (new goal) PT Short Term Goal 3 (Week 1): Pt will demonstrate decreased fall risk as noted by an improvement of at least 7 points on the [employer] Test PT Short Term Goal 3 - Progress (Week 1): Other (comment) (new goal) PT Short Term Goal 4 (Week 1): Pt will navigate 4 steps using HRs with no more than CGA  Skilled Therapeutic Interventions/Progress Updates:     1st session: Pt sitting in wheelchair to start - has no complaints of pain. [name] wheelchair caught b/w rolling tray legs - pt unable to problem solve how to get herself unstuck at wheelchair level and needed assist for problem solving and task sequencing for organized thought.   Sit<>Stand with no AD at CGA level - min cues for safety awareness for locking brakes and lifting foot pedals from wheelchair. Ambulated >219ft with CGA and no AD from her room to main rehab gym. Worked on dynamic gait training during session using agility ladder to promote symmetricla stride length with reciprocal stepping. Motor planning deficits noted while side stepping and needed minA for stability with several LOB. Finished session by instructed her with carrying a tray with a water cup to work on core stability, dual-cog tasking, and improving coordination and pace with turns. Patient didn't spill the cup of water once but needed min to mod cues for awareness.   Ended treatment with all needs  met, chair pad alarm on, call bell in reach  2nd session: Session focused on NMR and dynamic standing balance. Pt complains of no pain. Pt setup in [employer] harness while standing with CGA for safety. Pt instructed in dynamic balance activities such as small/large ball toss, kicking soccer ball, hitting slow pitch baseball, and forward/backward walking while in [place]. Supervision for safety while she completed tasks. Patient had several minor LOB that were corrected from assistance harness but also with stepping strategies. Patient with fair hand/eye coordination for all activities. Ended treatment with all needs met in her room, sitting in wheelchair with chair pad alarm on and all needs met.     Therapy Documentation Precautions:  Precautions Precautions: Fall Precaution/Restrictions Comments: decreased stand balance- reliant on UE, Pain in LLE limits mobility at times Required Braces or Orthoses: Other Brace Other Brace: B adjustable night splints; B elbow "cosey" splints; L palm protector; R mitt Restrictions Weight Bearing Restrictions Per Provider Order: No General:     Therapy/Group: Individual Therapy  Orrin Brigham 07/03/2023, 7:50 AM

## 2023-07-03 NOTE — Progress Notes (Signed)
PROGRESS NOTE   Subjective/Complaints: No new complaints this morning Ambulating S Has not noted benefits or side effects from myrbetriq  ROS: as per HPI. Denies CP, SOB, abd pain, N/V/D/C, or any other complaints at this time.  +low back pain- variable, but managable, +muscle spasms, +urinary incontinence    Objective:   No results found.    No results for input(s): "WBC", "HGB", "HCT", "PLT" in the last 72 hours.          No results for input(s): "NA", "K", "CL", "CO2", "GLUCOSE", "BUN", "CREATININE", "CALCIUM" in the last 72 hours.           Intake/Output Summary (Last 24 hours) at 07/03/2023 1303 Last data filed at 07/03/2023 0759 Gross per 24 hour  Intake 1180 ml  Output --  Net 1180 ml           Physical Exam: Vital Signs Blood pressure 97/76, pulse 78, temperature 98.1 F (36.7 C), resp. rate 18, height 5\' 1"  (1.549 m), weight 62.4 kg, SpO2 100%.  Constitutional: No distress . Vital signs reviewed. Resting in bed, comfortable HEENT: NCAT, EOMI, oral membranes moist Neck: supple Cardiovascular: RRR without murmur. No JVD    Respiratory/Chest: CTA Bilaterally without wheezes or rales. Normal effort GI/Abdomen: BS +, non-tender, non-distended, soft Ext: no clubbing, cyanosis, or edema Psych: pleasant and cooperative   Skin: + Scaling dried skin on bilateral heels Neurologic: alert, oriented to person, place. Fair insight Phonation and speech intelligability normal. STM deficits but improving. Minimal hypertonicity RUE and RLE. Hypersensitivity to left hand improved, Left sided strength improved, decreased range of motion and pain at end range of motion in left shoulder, stable 2/18  PRIOR EXAMS: Musculoskeletal:some limitations in right shoulder ROM, LB TTP     Assessment/Plan: 1. Functional deficits which require 3+ hours per day of interdisciplinary therapy in a comprehensive  inpatient rehab setting. Physiatrist is providing close team supervision and 24 hour management of active medical problems listed below. Physiatrist and rehab team continue to assess barriers to discharge/monitor patient progress toward functional and medical goals  Care Tool:  Bathing    Body parts bathed by patient: Face, Abdomen, Chest, Left upper leg, Right upper leg, Left arm, Front perineal area, Right arm, Right lower leg, Left lower leg, Buttocks   Body parts bathed by helper: Buttocks Body parts n/a: Right arm, Left arm, Front perineal area, Buttocks, Right upper leg, Left upper leg, Right lower leg, Left lower leg, Chest, Abdomen, Face   Bathing assist Assist Level: Supervision/Verbal cueing     Upper Body Dressing/Undressing Upper body dressing   What is the patient wearing?: Bra, Pull over shirt    Upper body assist Assist Level: Contact Guard/Touching assist    Lower Body Dressing/Undressing Lower body dressing      What is the patient wearing?: Underwear/pull up, Pants     Lower body assist Assist for lower body dressing: Supervision/Verbal cueing     Toileting Toileting Toileting Activity did not occur (Clothing management and hygiene only): N/A (no void or bm)  Toileting assist Assist for toileting: Supervision/Verbal cueing     Transfers Chair/bed transfer  Transfers assist  Chair/bed transfer activity  did not occur: Safety/medical concerns  Chair/bed transfer assist level: Supervision/Verbal cueing Chair/bed transfer assistive device: Armrests, Geologist, engineering   Ambulation assist   Ambulation activity did not occur: Safety/medical concerns  Assist level: Contact Guard/Touching assist Assistive device: Walker-rolling Max distance: 300'   Walk 10 feet activity   Assist  Walk 10 feet activity did not occur: Safety/medical concerns  Assist level: Contact Guard/Touching assist Assistive device: Walker-rolling   Walk 50  feet activity   Assist Walk 50 feet with 2 turns activity did not occur: Safety/medical concerns  Assist level: Contact Guard/Touching assist Assistive device: Walker-rolling    Walk 150 feet activity   Assist Walk 150 feet activity did not occur: Safety/medical concerns  Assist level: Contact Guard/Touching assist Assistive device: Walker-rolling    Walk 10 feet on uneven surface  activity   Assist Walk 10 feet on uneven surfaces activity did not occur: Safety/medical concerns   Assist level: Contact Guard/Touching assist Assistive device: Walker-rolling   Wheelchair     Assist Is the patient using a wheelchair?: Yes Type of Wheelchair: Manual    Wheelchair assist level: Supervision/Verbal cueing Max wheelchair distance: 155ft    Wheelchair 50 feet with 2 turns activity    Assist    Wheelchair 50 feet with 2 turns activity did not occur: Safety/medical concerns   Assist Level: Supervision/Verbal cueing   Wheelchair 150 feet activity     Assist  Wheelchair 150 feet activity did not occur: Safety/medical concerns   Assist Level: Supervision/Verbal cueing   Blood pressure 97/76, pulse 78, temperature 98.1 F (36.7 C), resp. rate 18, height 5\' 1"  (1.549 m), weight 62.4 kg, SpO2 100%.  Medical Problem List and Plan: 1. 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 Awaiting approval of disability. SW has continued to work on placement. We also have engaged leadership within hospital for options. Code status changed to full code after obtaining patient  consent -therapies are daily only SW note reviewed and we do not have any accepting facilities thus far Discussed that we are waiting to hear from disability Continue CIR  2.  Impaired mobility: d/c lovenox since ambulating >150 feet, continue SCDs, discussed that she still cannot turn safely  3. Diffuse pains: resolved, gabapentin d/ced, lidocaine patches for back pain, continue  4. History of anxiety: discussed with her mom that she feels this was a big risk factor for her accident, propanolol d/ced due to hypotension. Asked SW to place her on neuropsych list, Buspar ordered prn, continue this. Discussed placing outpatient referral for behavioral health f/u, discussed that Buspar has been helpful for her. Decreased buspar to daily prn, recreational therapy consulted  5. Impulsive, impaired cognition: renewed safety restraints. This patient is not capable of making decisions on her own behalf, messaged nursing to see if they feel she still needs restraints  6. Skin: eucerin cream BID -Buttock/sacrum--foam dressing, pressure relief, nutrition -stage 3 left heel, resolved  7. Fluids/Electrolytes/Nutrition: Routine in and outs with follow-up chemistries  -Eating  well with cueing   Upgraded to regular diet  -Vit D 4000U daily      8.  Cavitary right lower lobe pneumonia likely aspiration pneumonia/MRSA pneumonia.  Resolved    Latest Ref Rng & Units 05/30/2023    5:25 AM 05/16/2023    6:28 AM 05/02/2023    5:20 AM  CBC  WBC 4.0 - 10.5 K/uL 5.3  6.2  5.9   Hemoglobin 12.0 - 15.0 g/dL 11.9  14.7  82.9   Hematocrit 36.0 - 46.0 % 41.5  42.1  43.0   Platelets 150 - 400 K/uL 223  300  241      9.  History of drug abuse.  Positive cocaine on urine drug screen.  Provide counseling  10.  AKI/hypovolemia and ATN.  Resolved -BUN/creatinine stable 1/20  -Potassium 3.9 1/20    Latest Ref Rng & Units 05/30/2023    5:25 AM 05/16/2023    6:28 AM 05/02/2023    5:20 AM  BMP  Glucose 70 - 99  mg/dL 87  87  89   BUN 6 - 20 mg/dL 10  15  14    Creatinine 0.44 - 1.00 mg/dL 5.62  1.30  8.65   Sodium 135 - 145 mmol/L 137  136  138   Potassium 3.5 - 5.1 mmol/L 3.9  3.9  4.0   Chloride 98 - 111 mmol/L 107  105  109   CO2 22 - 32 mmol/L 25  22  23    Calcium 8.9 - 10.3 mg/dL 8.6  8.9  8.9     11.  Mild transaminitis with rhabdomyolysis.  Both resolved  12.  Hypotension:  D/c flomax, continue to monitor BP TID -2/1-16/25 BP fine/stable, cont monitoring  Vitals:   06/29/23 0550 06/29/23 1457 06/29/23 2024 06/30/23 0512  BP: 97/64 112/75 104/64 98/69   06/30/23 1343 06/30/23 1948 07/01/23 0419 07/01/23 1559  BP: 108/87 99/63 92/60  97/73   07/01/23 2000 07/02/23 1622 07/02/23 1928 07/03/23 0613  BP: 106/73 94/70 108/76 97/76     13. HTN: resolved  14. Impaired initiation: resolved, d/c amantadine  -Consider restart amantadine-- restarted 100mg  daily  15. Hyperglycemia: CBGs mildly elevated, d/ced checks. Prosource d/ced as per patient's request  16. Spasticity:   improved  17.  Dyskinetic movements in head and neck, discussed with team that these seem to restart when baclofen is reastarted  18. Cervical extensor weakness: improved, continue strengthening exercises  19. Bowel and bladder incontinence: continue bowel and bladder program. Flomax started for urgency-- d/c'd -06/24/23 pt frustrated with incontinence/how long it takes her to try to urinate; discussed that she's had a variety of meds tried with no significant difference; continue trying timed toileting, but wonder if outpatient urology f/up for urodynamic studies would be beneficial?  20. Fatigue: resolved, d/c b complex   21. MRSA nares positive: negative on repeat, precautions d/ced  22. Tachycardic: resolved  23. S/p fall: CT head reviewed and is stable  24. Bilateral foot drop: improved  25. Vaginal itching: resolved  26. Dysuria resolved  27. Poor sitting balance: continue OT/PT, improving  28.  Headaches: topamax 76m daily prn ordered, will d/c since not requiring  29. Drowsiness with tramadol: d/ced  30. IUD in place: Pt states she has IUD x1yrs  and believes it's supposed to be removed soon(chart review- Dr. Seymour Bars placed Mirena IUD on 07/01/19, good for 67yrs); needs outpatient follow-up with OB/GYN  31. Constipation: d/c baclofen, last BM 12/18, milk of magnesia ordered 12/20  -LBM 1/27, restart magnesium gluconate 250mg  HS  Continue current regimen  -07/01/23 LBM today, cont regimen  32. History  of drug addiction: tramadol d/ced as per mom's preference  33. Depression: discussed scheduling for outpatient psychology/psychiatry follow-up, discussed neuropsych f/u while in hospital  -appreciate Dr. Marvetta Gibbons follow up  34. Sinusitis: claritin ordered prn, will d/c since not requiring  35. Pruritus: sarna lotion ordered, discussed that this is prn, will d/c since not requiring  26. Low back pain: XR ordered and discussed that it is negative, continue lidocaine patch, tramadol added prn, advised that she will have to ask for medication if she feels that she needs it, continue baclofen 5mg  HS  27. Polypharmacy: amantadine d/ced since no changes noted with it, continue current medications  28. Insomnia: change melatonin to prn, continue baclofen 5mg  HS  29. Muscle spasms: continue baclofen 5mg  HS  30 urinary retention: myrbetriq 25mg  daily started  LOS: 227 days A FACE TO FACE EVALUATION WAS PERFORMED  Nicole [name] P Gregori Abril 07/03/2023, 1:03 PM

## 2023-07-04 NOTE — Plan of Care (Signed)
  Problem: Consults Goal: RH STROKE PATIENT EDUCATION Description: See Patient Education module for education specifics  Outcome: Progressing   Problem: RH BOWEL ELIMINATION Goal: RH STG MANAGE BOWEL WITH ASSISTANCE Description: STG Manage Bowel with mod I Assistance. Outcome: Progressing Goal: RH STG MANAGE BOWEL W/MEDICATION W/ASSISTANCE Description: STG Manage Bowel with Medication with mod I Assistance. Outcome: Progressing   Problem: RH BLADDER ELIMINATION Goal: RH STG MANAGE BLADDER WITH ASSISTANCE Description: STG Manage Bladder With toileting Assistance Outcome: Progressing   Problem: RH SKIN INTEGRITY Goal: RH STG MAINTAIN SKIN INTEGRITY WITH ASSISTANCE Description: STG Maintain Skin Integrity With min  Assistance. Outcome: Progressing

## 2023-07-04 NOTE — Plan of Care (Signed)
  Problem: Consults Goal: RH STROKE PATIENT EDUCATION Description: See Patient Education module for education specifics  Outcome: Progressing   Problem: RH BOWEL ELIMINATION Goal: RH STG MANAGE BOWEL WITH ASSISTANCE Description: STG Manage Bowel with mod I Assistance. Outcome: Progressing   Problem: RH BOWEL ELIMINATION Goal: RH STG MANAGE BOWEL W/MEDICATION W/ASSISTANCE Description: STG Manage Bowel with Medication with mod I Assistance. Outcome: Progressing   Problem: RH BLADDER ELIMINATION Goal: RH STG MANAGE BLADDER WITH ASSISTANCE Description: STG Manage Bladder With toileting Assistance Outcome: Progressing   Problem: RH SKIN INTEGRITY Goal: RH STG MAINTAIN SKIN INTEGRITY WITH ASSISTANCE Description: STG Maintain Skin Integrity With min  Assistance. Outcome: Progressing

## 2023-07-04 NOTE — Progress Notes (Signed)
Occupational Therapy Weekly Progress Note  Patient Details  Name: Nicole [name] MRN: [id number] Date of Birth: 05-04-1976  Beginning of progress report period: June 27, 2023 End of progress report period: July 04, 2023  Today's Date: 07/04/2023  Patient has met 0 of 3 short term goals however pt is progressing towards last weeks short term goals. Pt currently requires up to MIN A for ADLs for safety and problem solving as well as balance. Pt completes ambulatory ADL transfers with RW and CGA- supervision. Pt continues to present with impaired balance, problem solving, and attention.   Patient continues to demonstrate the following deficits: {impairments:3041632} and therefore will continue to benefit from skilled OT intervention to enhance overall performance with {ADL/iADL:3041649}.  Patient {LTG progression:3041653}.  {plan of QION:6295284}  OT Short Term Goals Week 1:  OT Short Term Goal 1 (Week 1): Patient will brush hair and put into ponytail with no more than set up assistance OT Short Term Goal 1 - Progress (Week 1): Progressing toward goal OT Short Term Goal 2 (Week 1): Patient will complete simple snack prep at ambulation level and close supervision OT Short Term Goal 2 - Progress (Week 1): Progressing toward goal OT Short Term Goal 3 (Week 1): Patient will reach with LUE over 110* shoulder flexion without lateral trunk lean to obtain a lightweight object from shelf. OT Short Term Goal 3 - Progress (Week 1): Progressing toward goal OT Short Term Goal 4 (Week 1): xxx OT Short Term Goal 4 - Progress (Week 1): Other (comment) OT Short Term Goal 5 (Week 1): xxx OT Short Term Goal 5 - Progress (Week 1): Other (comment)    Therapy Documentation Precautions:  Precautions Precautions: Fall Precaution/Restrictions Comments: decreased stand balance- reliant on UE, Pain in LLE limits mobility at times Required Braces or Orthoses: Other Brace Other Brace: B adjustable night  splints; B elbow "cosey" splints; L palm protector; R mitt Restrictions Weight Bearing Restrictions Per Provider Order: No    Therapy/Group: Individual Therapy  [name] [facility] 07/04/2023, 8:48 AM

## 2023-07-04 NOTE — Progress Notes (Signed)
Called to notify oncall Deatra Ina, PA of patients overdue 0800 medication. No new orders, just continue medication as ordered. Called patients mother around 16pm and notified of overdue medications from the morning.

## 2023-07-04 NOTE — Patient Care Conference (Signed)
Inpatient RehabilitationTeam Conference and Plan of Care Update Date: 07/04/2023   Time: 11:10 AM    Patient Name: Nicole [name]      Medical Record Number: [id number]  Date of Birth: 09-08-1975 Sex: Female         Room/Bed: 4W09C/4W09C-01 Payor Info: Payor: [employer] / Plan: [employer] / Product Type: *No Product type* /    Admit Date/Time:  11/18/2022  3:15 PM  Primary Diagnosis:  Hypoxic brain injury [facility])  Hospital Problems: Principal Problem:   Hypoxic brain injury (HCC) Active Problems:   Protein-calorie malnutrition, severe   Essential hypertension   Pressure injury of skin   History of cocaine abuse (HCC)   Anxiety with depression    Expected Discharge Date: Expected Discharge Date:  (SNF pending)  Team Members Present: Physician leading conference: Dr. Sula Soda Social Worker Present: Dossie Der, LCSW Nurse Present: Chana Bode, RN PT Present: Wynelle Link, PT OT Present: Candee Furbish, OT SLP Present: [name], SLP PPS Coordinator present : Fae Pippin, SLP     Current Status/Progress Goal Weekly Team Focus  Bowel/Bladder   Incontinent at times of bowel and bladder. LBM 07/03/23   Decrease number of incontinent episodes.   Toileting q2 hours during day and q4 at hs.    Swallow/Nutrition/ Hydration               ADL's   Continues with impulsivity, min-CGA for LB, set up and cues UB, close S for RW use in room due to tight spaces and obstacles, working on set up of routine and hair management   goals supervision - contact guard   still with back pain but participates consistently, barrier is dispo    Mobility   no change - CGA to supervision - working on ambulating without an AD   Supervision/CGA  general mobility, dual-cog, safety awareness    Communication                Safety/Cognition/ Behavioral Observations  minA for functional cognitive tasks, modA during cognitive tasks of increasing complexity -  attention, processing, problem solving/reasoning, impulsive responses, initiation deficits   s for functional tasks   functional problem solving, error awareness, processing    Pain   C/O of chronic back pain.   PT states pain level is less that 3 out of 10.   Administer routine and PRn medication as ordered.    Skin   Skin intact. Prevenative foams to bilateral heels and coccyx   Remain free from any skin breakdown.  Assess skin q shift and PRN.      Discharge Planning:  Continue to check with [employer] regarding status of her disability still pending. Have not heard from [facility] this week regarding if still her payer source.   Team Discussion: Patient post hypoxic brain injury. Patient continues to demonstrate slow improvement in function and is limited by selective attention, problem solving deficits poor carry over of strategies which impairs her ability to manage dual cognitive tasks. Note urge incontinence; MD added Myrbetriq.  Patient on target to meet rehab goals: yes, continues to need min assist for ADLs and is deemed unsafe to transfer herself to a [facility] for toileting.  Needs min assist to pick up objects from the floor. Able to ambulate with and without an assistive device with CGA - supervision assistance. Continues to need min assist for functional cognition, and mod assist for complex tasks.   *See Care Plan and progress notes for  long and short-term goals.   Revisions to Treatment Plan:  N/a   Teaching Needs: Safety, medications, skin care, transfers, toileting, etc.   Current Barriers to Discharge: Lack of/limited family support and Insurance for SNF coverage  Possible Resolutions to Barriers: Awaiting long term disability for funding for SNF placement     Medical Summary Current Status: urinary retention, muscle spasms, back pain, insomnia  Barriers to Discharge: Medical stability  Barriers to Discharge Comments: urinary retention, muscle spasms, back pain,  insomnia Possible Resolutions to [employer] Focus: myrbetriq started, continue baclofen 5mg  at night, continue lidocaine patch   Continued Need for Acute Rehabilitation Level of Care: The patient requires daily medical management by a physician with specialized training in physical medicine and rehabilitation for the following reasons: Direction of a multidisciplinary physical rehabilitation program to maximize functional independence : Yes Medical management of patient stability for increased activity during participation in an intensive rehabilitation regime.: Yes Analysis of laboratory values and/or radiology reports with any subsequent need for medication adjustment and/or medical intervention. : Yes   I attest that I was present, lead the team conference, and concur with the assessment and plan of the team.   Chana Bode B 07/04/2023, 2:32 PM
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
## 2023-07-04 NOTE — Progress Notes (Signed)
Occupational Therapy Session Note  Patient Details  Name: Nicole [name] MRN: [id number] Date of Birth: [birth year]  Today's Date: 07/04/2023 OT Individual Time: 4098-1191 OT Individual Time Calculation (min): 32 min    Short Term Goals: Week 9: OT Short Term Goal 1 (Week 9): Patient will walk to bathroom without device and close supervision OT Short Term Goal 1 - Progress (Week 9): Progressing toward goal OT Short Term Goal 2 (Week 9): Patient will don bra with no more than 3 verbal cues OT Short Term Goal 2 - Progress (Week 9): Met OT Short Term Goal 3 (Week 9): Patient will complete simple meal prep with min assist OT Short Term Goal 3 - Progress (Week 9): Not met OT Short Term Goal 4 (Week 9): xxx OT Short Term Goal 4 - Progress (Week 9): Other (comment) OT Short Term Goal 5 (Week 9): xxx OT Short Term Goal 5 - Progress (Week 9): Other (comment)  Skilled Therapeutic Interventions/Progress Updates:  Pt greeted supine in bed with NT present, pt agreeable to OT intervention.      Transfers/bed mobility/functional mobility:  Pt completed supine>sit MODI. Ambulatory ADL transfers with RW and CGA- supervision.    ADLs: pt requires cues to sequence through ADL routine and will often look to therapist for guidance about what to do next in routine.  Grooming: pt opted to stand today to don deo and apply lotion with supervision, assist provided to don lotion on her back.  UB dressing:oonned bra with MIN A to fasten clasp but able to don OH shirt with set- up LB dressing: donned brief with set- up assist via figure 4, supervision for balance when standing to pull brief to waist line. Pt donned pants via figure 4 with set- up assist, supervision provided for balance when standing to pull pants to waist line.  Footwear: donned socks via figure 4 with set- up assist.   Bathing: pt completed bathing seated on shower seat with supervision, however pt did appropriately ask for assistance for making  sure shampoo was out of her hair Transfers: ambulatory ADL transfers with RW and CGA.    Ended session with pt seated in w/c at sink completing grooming with chair alarm activated.                Therapy Documentation Precautions:  Precautions Precautions: Fall Precaution/Restrictions Comments: decreased stand balance- reliant on UE, Pain in LLE limits mobility at times Required Braces or Orthoses: Other Brace Other Brace: B adjustable night splints; B elbow "cosey" splints; L palm protector; R mitt Restrictions Weight Bearing Restrictions Per Provider Order: No  Pain: unrated pain reported in legs, rest breaks provided as needed with pain meds provided during session    Therapy/Group: Individual Therapy  [name] [facility] 07/04/2023, 12:07 PM

## 2023-07-04 NOTE — Progress Notes (Signed)
Physical Therapy Session Note  Patient Details  Name: Nicole [name] MRN: [id number] Date of Birth: [birth year]  Today's Date: 07/04/2023 PT Individual Time: 1130-1200 PT Individual Time Calculation (min): 30 min   Short Term Goals: Week 1:  PT Short Term Goal 1 (Week 1): Pt will perform supine<>sit with supervision PT Short Term Goal 1 - Progress (Week 1): Other (comment) (new goal) PT Short Term Goal 2 (Week 1): Pt will ambulate at least 165ft with no more than min A, not using an AD. PT Short Term Goal 2 - Progress (Week 1): Other (comment) (new goal) PT Short Term Goal 3 (Week 1): Pt will demonstrate decreased fall risk as noted by an improvement of at least 7 points on the [employer] Test PT Short Term Goal 3 - Progress (Week 1): Other (comment) (new goal) PT Short Term Goal 4 (Week 1): Pt will navigate 4 steps using HRs with no more than CGA  Skilled Therapeutic Interventions/Progress Updates:      Pt in wheelchair to start - agreeable to PT tx. Denies any pain. Focused session on dynamic gait training and dynamic balance training. Ambulated with CGA and no AD for distances > 540ft - pt had x1 LOB when she became internally distracted and needed maxA for balance recovery to prevent a fall. Dynamic gait included "dribbling" a basketball, self toss a basketball, and tossing basketball b/w PT and herself while ambulating. Patient required heavy minA for any dynamic gait with frequent LOB anteriorly, including when picking up object from ground in standing. She can be impulsive with decision making with impaired judgement in safety awareness as she prioritizes the task > balance and safety. Pt returned to her room at conclusion of session, left sitting upright with chair pad alarm on.   Therapy Documentation Precautions:  Precautions Precautions: Fall Precaution/Restrictions Comments: decreased stand balance- reliant on UE, Pain in LLE limits mobility at times Required Braces or Orthoses:  Other Brace Other Brace: B adjustable night splints; B elbow "cosey" splints; L palm protector; R mitt Restrictions Weight Bearing Restrictions Per Provider Order: No General:       Therapy/Group: Individual Therapy  Orrin Brigham 07/04/2023, 12:17 PM

## 2023-07-05 NOTE — Progress Notes (Signed)
Physical Therapy Weekly Progress Note  Patient Details  Name: Nicole [name] MRN: [id number] Date of Birth: 1975-11-16  Beginning of progress report period: June 29, 2023 End of progress report period: July 05, 2023  Today's Date: 07/05/2023 PT Individual Time: 1015-1045 + 1300-1411  PT Individual Time Calculation (min): 30 min  + 71 min   Nicole [name] is continuing to demonstrate progress towards functional mobility. Working on ambulation and safety without a RW. She continues to be primarily limited by impaired safety awareness, poor attention, decreased insight, impaired dynamic standing balance, and mild impulsivity. DC plan remains unknown.   Patient continues to demonstrate the following deficits muscle weakness and muscle joint tightness, decreased cardiorespiratoy endurance, unbalanced muscle activation, motor apraxia, decreased coordination, and decreased motor planning, decreased motor planning, decreased attention, decreased awareness, decreased problem solving, and decreased safety awareness, and decreased sitting balance, decreased standing balance, decreased postural control, and decreased balance strategies and therefore will continue to benefit from skilled PT intervention to increase functional independence with mobility.  Patient progressing toward long term goals..  Continue plan of care.  PT Short Term Goals Week 1:  PT Short Term Goal 1 (Week 1): Pt will perform supine<>sit with supervision PT Short Term Goal 1 - Progress (Week 1): Other (comment) (new goal) PT Short Term Goal 2 (Week 1): Pt will ambulate at least 149ft with no more than min A, not using an AD. PT Short Term Goal 2 - Progress (Week 1): Other (comment) (new goal) PT Short Term Goal 3 (Week 1): Pt will demonstrate decreased fall risk as noted by an improvement of at least 7 points on the [employer] Test PT Short Term Goal 3 - Progress (Week 1): Other (comment) (new goal) PT Short Term Goal 4 (Week 1):  Pt will navigate 4 steps using HRs with no more than CGA  Skilled Therapeutic Interventions/Progress Updates:      1st session: Direct handoff of care from RN to start - patient has no complaints of pain and agreeable to PT tx. Patient requesting to go outside to see the snow fall. Functional gait training completed with CGA to close supervision with no AD in CIR hallways. Intermittent minA needed at times when she becomes internally and externally distracted from staff, distractions, or quick turns. Pt often catches her L foot during gait but her balance recovery strategies or somewhat improving. Pt ambulated outdoors at minA level with no AD - minA needed as patient rushing and walking quickly because of cold weather - making quick and impulsive decisions on changing direction or trying to see the snow fall. Returned upstairs to CIR floor and patient needed min cues for directional awareness to return to floor. Patient concluded session seated in wheelchair with chair pad alarm on.   2nd session: Pt in bathroom sitting in her wheelchair - changing shirts. She was able to doff/don shirts without assist. When asked, reports she wheeled herself into the bathroom for privacy. Reports she spilled Tomato Soup on her shirt. Per staff, she has changed outfits x4 times today because of various reasons.   Pt ambulated with CGA and no AD to main rehab gym, cues for pacing herself and being mindful of her environment.   Worked on dynamic balance and dynamic gait training during session. Completed lateral step ups on/off 4" step - patient needing min/modA for balance due to frequent LOB and significant motor planning deficits. Also completed forward/backward step ups on/off 4" step and needed similar assist. More  difficulty with backward stepping > forward stepping.   In // bars, worked on initiating plyometrics - forward/backward hopping, lateral hopping. Patient struggling with motor planning with this task  too. Took a video of her (with permission) to show her impairments and sequencing deficits. Continued dynamic tasks with heel walking, toe walking, forward/backward walking, and dynamic high knee walking. minA needed for high knee and backward walking without UE support.   Returned to her room and patient ended session seated in wheelchair with all her needs met.   Therapy Documentation Precautions:  Precautions Precautions: Fall Precaution/Restrictions Comments: decreased stand balance- reliant on UE, Pain in LLE limits mobility at times Required Braces or Orthoses: Other Brace Other Brace: B adjustable night splints; B elbow "cosey" splints; L palm protector; R mitt Restrictions Weight Bearing Restrictions Per Provider Order: No General:    Therapy/Group: Individual Therapy  Orrin Brigham 07/05/2023, 7:51 AM

## 2023-07-05 NOTE — Progress Notes (Signed)
Recreational Therapy Session Note  Patient Details  Name: Nicole [name] MRN: [id number] Date of Birth: 01-Apr-1976 Today's Date: 07/05/2023 Goal:  Pt will propel w/c with supervision throughout the unit with supervision.  MET Pt will require moderate questioning cues to play modified UNO.  NOT MET Pain: no c/o Skilled Therapeutic Interventions/Progress Updates: Pt seated in w/c ready for session.  Pt propelled w/c throughout the unit with supervision/min cues, needing extra time due to difficulty coordinating LUE for mobiltiy.  In crowded environment, pt needed min assist for avoiding obstacles.  Pt taken to the gift shop where she wished to purchase chap stick.  Pt without money, so initiated and requested that the cashier hold her item until tomorrow when her mom arrives.  Returned to the unit and pt interested in playing a card game.  Began with LRT/CTRS shuffling and dealing to play modified UNO (all specialty cards removed).  Pt required moderate cues initially, but required max cues due to impulsivity as they game progressed.  Pt dealt cards for the last few hands with min questioning cues for remembering how to deal & how many to deal each player.  Pt stated she enjoyed this session.  Therapy/Group: Individual Therapy  Alissia Lory 07/05/2023, 1:54 PM

## 2023-07-05 NOTE — Progress Notes (Signed)
Occupational Therapy Session Note  Patient Details  Name: Nicole [name] MRN: [id number] Date of Birth: 1975/09/24  Today's Date: 07/05/2023 OT Individual Time: 9562-1308 OT Individual Time Calculation (min): 70 min    Short Term Goals: Week 9: OT Short Term Goal 1 (Week 9): Patient will walk to bathroom without device and close supervision OT Short Term Goal 1 - Progress (Week 9): Progressing toward goal OT Short Term Goal 2 (Week 9): Patient will don bra with no more than 3 verbal cues OT Short Term Goal 2 - Progress (Week 9): Met OT Short Term Goal 3 (Week 9): Patient will complete simple meal prep with min assist OT Short Term Goal 3 - Progress (Week 9): Not met OT Short Term Goal 4 (Week 9): xxx OT Short Term Goal 4 - Progress (Week 9): Other (comment) OT Short Term Goal 5 (Week 9): xxx OT Short Term Goal 5 - Progress (Week 9): Other (comment)  Skilled Therapeutic Interventions/Progress Updates:  Pt greeted seated in toilet with nurse present, pt agreeable to OT intervention and requested to shower.   Transfers/bed mobility/functional mobility: ambulatory ADL transfers with RW and supervision - CGA.   Therapeutic activity: pt completed simulated IADL task of reaching for items OH in cabinets and pantry in kitchen with LUE to facilitate improved AROM in LUE. Pt completed task with CGA for balance, MIN verbal cues needed for safety as pt attempting to reach up on to her tippy toes to access items out of reach impacting her balance.   Pt completed simulate ADL task of practicing pulling up pants to waist line from [facility] in apt. Pt able to reach to floor in standing to pull up gait belt as simulated pants with CGA for balance but no LOB noted. Pt completed task x3 but did require cues for sequencing and problem solving.   Pt practiced transporting items from floor level to counter in apt to facilitate improved dynamic balance, pt completed task with no LOB but unilateral support from  counter.   Worked on modified yoga poses from EOM to provide gentle stretches to tight LUE and decrease chronic back pain. Poses included cat/cows, spinal twist and "thread the needle" pt needed MAX multimodal cues to set- up movements and maintain concentric control during poses.    ADLs:  Grooming: pt completed seated grooming at sink with pt completing oral care with set- up assist. Pt able to fasten her own ponytail today with MODA, as therapist pulled hair together with pt then able to grasp ponytail and wrap band around 3x with MIN cueing for technique.  UB dressing: donned OH shirt with set-up assist LB dressing: donned brief from EOB with CGA for safety when standing to pull to waist line. Pt needed MIN A to don pants in order to orient pants correctly  Footwear: pt donned socks via figure 4 with set- up assist and pt opted to step into slide in sneakers today with supervision.   Bathing: pt completed bathing sitting on shower seat with supervision, pt asking for assistance appropriately demonstrating improved ability to direct level of care.  Transfers: ambulatory ADL transfers in room with RW and supervision - CGA.     Exercises: pt completed supine therex to improve LUE AROM with pt instructed on below therex: -shoulder flexion with un weighted dowel rod with an emphasis on active assist ROM, emphasis on pairing breath with movement to increase AROM  -horizontal shoulder ABD/ADD with an emphasis on keeping L hand on  mat and practicing "snow angles"    Ended session with pt seated in w/c with all needs within reach and safety belt  alarm activated.                    Therapy Documentation Precautions:  Precautions Precautions: Fall Precaution/Restrictions Comments: decreased stand balance- reliant on UE, Pain in LLE limits mobility at times Required Braces or Orthoses: Other Brace Other Brace: B adjustable night splints; B elbow "cosey" splints; L palm protector; R  mitt Restrictions Weight Bearing Restrictions Per Provider Order: No  Pain: unrated pain reported in BLEs, rest breaks provided as needed.    Therapy/Group: Individual Therapy  Barron Schmid 07/05/2023, 12:10 PM

## 2023-07-05 NOTE — Plan of Care (Signed)
  Problem: Consults Goal: RH STROKE PATIENT EDUCATION Description: See Patient Education module for education specifics  Outcome: Progressing   Problem: RH BOWEL ELIMINATION Goal: RH STG MANAGE BOWEL WITH ASSISTANCE Description: STG Manage Bowel with mod I Assistance. Outcome: Progressing   Problem: RH BLADDER ELIMINATION Goal: RH STG MANAGE BLADDER WITH ASSISTANCE Description: STG Manage Bladder With toileting Assistance Outcome: Progressing   Problem: RH SKIN INTEGRITY Goal: RH STG MAINTAIN SKIN INTEGRITY WITH ASSISTANCE Description: STG Maintain Skin Integrity With min  Assistance. Outcome: Progressing

## 2023-07-05 NOTE — Progress Notes (Signed)
PROGRESS NOTE   Subjective/Complaints: No new complaints this morning No benefits from the myrbetriq or side effects, she would like to continue this Working with OT in gym   ROS: Denies CP, SOB, abd pain, N/V/D/C, or any other complaints at this time.  +low back pain- variable, but managable, +muscle spasms, +urinary incontinence    Objective:   No results found.    No results for input(s): "WBC", "HGB", "HCT", "PLT" in the last 72 hours.          No results for input(s): "NA", "K", "CL", "CO2", "GLUCOSE", "BUN", "CREATININE", "CALCIUM" in the last 72 hours.           Intake/Output Summary (Last 24 hours) at 07/05/2023 1002 Last data filed at 07/05/2023 0735 Gross per 24 hour  Intake 720 ml  Output --  Net 720 ml           Physical Exam: Vital Signs Blood pressure (!) 99/56, pulse 76, temperature 97.8 F (36.6 C), temperature source Oral, resp. rate 18, height 5\' 1"  (1.549 m), weight 62.4 kg, SpO2 97%.  Constitutional: No distress . Vital signs reviewed. Resting in bed, comfortable HEENT: NCAT, EOMI, oral membranes moist Neck: supple Cardiovascular: RRR without murmur. No JVD    Respiratory/Chest: CTA Bilaterally without wheezes or rales. Normal effort GI/Abdomen: BS +, non-tender, non-distended, soft Ext: no clubbing, cyanosis, or edema Psych: pleasant and cooperative   Skin: + Scaling dried skin on bilateral heels Neurologic: alert, oriented to person, place. Fair insight Phonation and speech intelligability normal. STM deficits but improving. Minimal hypertonicity RUE and RLE. Hypersensitivity to left hand improved, Left sided strength improved, decreased range of motion and pain at end range of motion in left shoulder, stable 2/20  PRIOR EXAMS: Musculoskeletal:some limitations in right shoulder ROM, LB TTP     Assessment/Plan: 1. Functional deficits which require 3+ hours per day  of interdisciplinary therapy in a comprehensive inpatient rehab setting. Physiatrist is providing close team supervision and 24 hour management of active medical problems listed below. Physiatrist and rehab team continue to assess barriers to discharge/monitor patient progress toward functional and medical goals  Care Tool:  Bathing    Body parts bathed by patient: Face, Abdomen, Chest, Left upper leg, Right upper leg, Left arm, Front perineal area, Right arm, Right lower leg, Left lower leg, Buttocks   Body parts bathed by helper: Buttocks Body parts n/a: Right arm, Left arm, Front perineal area, Buttocks, Right upper leg, Left upper leg, Right lower leg, Left lower leg, Chest, Abdomen, Face   Bathing assist Assist Level: Supervision/Verbal cueing     Upper Body Dressing/Undressing Upper body dressing   What is the patient wearing?: Bra, Pull over shirt    Upper body assist Assist Level: Contact Guard/Touching assist    Lower Body Dressing/Undressing Lower body dressing      What is the patient wearing?: Underwear/pull up, Pants     Lower body assist Assist for lower body dressing: Supervision/Verbal cueing     Toileting Toileting Toileting Activity did not occur (Clothing management and hygiene only): N/A (no void or bm)  Toileting assist Assist for toileting: Supervision/Verbal cueing  Transfers Chair/bed transfer  Transfers assist  Chair/bed transfer activity did not occur: Safety/medical concerns  Chair/bed transfer assist level: Supervision/Verbal cueing Chair/bed transfer assistive device: Armrests, Geologist, engineering   Ambulation assist   Ambulation activity did not occur: Safety/medical concerns  Assist level: Contact Guard/Touching assist Assistive device: Walker-rolling Max distance: 300'   Walk 10 feet activity   Assist  Walk 10 feet activity did not occur: Safety/medical concerns  Assist level: Contact Guard/Touching  assist Assistive device: Walker-rolling   Walk 50 feet activity   Assist Walk 50 feet with 2 turns activity did not occur: Safety/medical concerns  Assist level: Contact Guard/Touching assist Assistive device: Walker-rolling    Walk 150 feet activity   Assist Walk 150 feet activity did not occur: Safety/medical concerns  Assist level: Contact Guard/Touching assist Assistive device: Walker-rolling    Walk 10 feet on uneven surface  activity   Assist Walk 10 feet on uneven surfaces activity did not occur: Safety/medical concerns   Assist level: Contact Guard/Touching assist Assistive device: Walker-rolling   Wheelchair     Assist Is the patient using a wheelchair?: Yes Type of Wheelchair: Manual    Wheelchair assist level: Supervision/Verbal cueing Max wheelchair distance: 177ft    Wheelchair 50 feet with 2 turns activity    Assist    Wheelchair 50 feet with 2 turns activity did not occur: Safety/medical concerns   Assist Level: Supervision/Verbal cueing   Wheelchair 150 feet activity     Assist  Wheelchair 150 feet activity did not occur: Safety/medical concerns   Assist Level: Supervision/Verbal cueing   Blood pressure (!) 99/56, pulse 76, temperature 97.8 F (36.6 C), temperature source Oral, resp. rate 18, height 5\' 1"  (1.549 m), weight 62.4 kg, SpO2 97%.  Medical Problem List and Plan: 1. 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 Awaiting approval of disability. SW has continued to work on placement. We also have engaged leadership within hospital for  options. Code status changed to full code after obtaining patient consent -therapies are daily only SW note reviewed and we do not have any accepting facilities thus far Discussed that we are waiting to hear from disability Continue CIR  2.  Impaired mobility: d/c lovenox since ambulating >150 feet, continue SCDs, discussed that she still cannot turn safely  3. Diffuse pains: resolved, gabapentin d/ced, lidocaine patches for back pain, continue  4. History of anxiety: discussed with her mom that she feels this was a big risk factor for her accident, propanolol d/ced due to hypotension. Asked SW to place her on neuropsych list, Buspar ordered prn, continue this. Discussed placing outpatient referral for behavioral health f/u, discussed that Buspar has been helpful for her. Decreased buspar to daily prn, recreational therapy consulted  5. Impulsive, impaired cognition: renewed safety restraints. This patient is not capable of making decisions on her own behalf, messaged nursing to see if they feel she still needs restraints  6. Skin: eucerin cream BID -Buttock/sacrum--foam dressing, pressure relief, nutrition -stage 3 left heel, resolved  7. Fluids/Electrolytes/Nutrition: Routine in and outs with follow-up chemistries  -Eating  well with cueing   Upgraded to  regular diet  -Vit D 4000U daily      8.  Cavitary right lower lobe pneumonia likely aspiration pneumonia/MRSA pneumonia.  Resolved    Latest Ref Rng & Units 05/30/2023    5:25 AM 05/16/2023    6:28 AM 05/02/2023    5:20 AM  CBC  WBC 4.0 - 10.5 K/uL 5.3  6.2  5.9   Hemoglobin 12.0 - 15.0 g/dL 24.4  01.0  27.2   Hematocrit 36.0 - 46.0 % 41.5  42.1  43.0   Platelets 150 - 400 K/uL 223  300  241      9.  History of drug abuse.  Positive cocaine on urine drug screen.  Provide counseling  10.  AKI/hypovolemia and ATN.  Resolved -BUN/creatinine stable 1/20  -Potassium 3.9 1/20    Latest Ref Rng & Units 05/30/2023    5:25 AM  05/16/2023    6:28 AM 05/02/2023    5:20 AM  BMP  Glucose 70 - 99 mg/dL 87  87  89   BUN 6 - 20 mg/dL 10  15  14    Creatinine 0.44 - 1.00 mg/dL 5.36  6.44  0.34   Sodium 135 - 145 mmol/L 137  136  138   Potassium 3.5 - 5.1 mmol/L 3.9  3.9  4.0   Chloride 98 - 111 mmol/L 107  105  109   CO2 22 - 32 mmol/L 25  22  23    Calcium 8.9 - 10.3 mg/dL 8.6  8.9  8.9     11.  Mild transaminitis with rhabdomyolysis.  Both resolved  12.  Hypotension:  D/c flomax, continue to monitor BP TID -2/1-16/25 BP fine/stable, cont monitoring  Vitals:   07/01/23 0419 07/01/23 1559 07/01/23 2000 07/02/23 1622  BP: 92/60 97/73 106/73 94/70   07/02/23 1928 07/03/23 0613 07/03/23 1345 07/03/23 2026  BP: 108/76 97/76 95/71  102/60   07/04/23 0506 07/04/23 1331 07/04/23 1951 07/05/23 0438  BP: 97/62 94/71 92/65  (!) 99/56     13. HTN: resolved  14. Impaired initiation: resolved, d/c amantadine  -Consider restart amantadine-- restarted 100mg  daily  15. Hyperglycemia: CBGs mildly elevated, d/ced checks. Prosource d/ced as per patient's request  16. Spasticity:   improved  17.  Dyskinetic movements in head and neck, discussed with team that these seem to restart when baclofen is reastarted  18. Cervical extensor weakness: improved, continue strengthening exercises  19. Bowel and bladder incontinence: continue bowel and bladder program. Flomax started for urgency-- d/c'd -06/24/23 pt frustrated with incontinence/how long it takes her to try to urinate; discussed that she's had a variety of meds tried with no significant difference; continue trying timed toileting, but wonder if outpatient urology f/up for urodynamic studies would be beneficial?  20. Fatigue: resolved, d/c b complex   21. MRSA nares positive: negative on repeat, precautions d/ced  22. Tachycardic: resolved  23. S/p fall: CT head reviewed and is stable  24. Bilateral foot drop: improved  25. Vaginal itching: resolved  26.  Dysuria resolved  27. Poor sitting balance: continue OT/PT, improving  28. Headaches: topamax 6m daily prn ordered, will d/c since not requiring  29. Drowsiness with tramadol: d/ced  30. IUD in place: Pt states she has IUD x67yrs  and believes it's supposed to be removed soon(chart review- Dr. Seymour Bars placed Mirena IUD on 07/01/19, good for 82yrs); needs outpatient follow-up with OB/GYN  31. Constipation: d/c baclofen, last BM 12/18, milk of magnesia ordered 12/20  -LBM 1/27, restart magnesium gluconate  250mg  HS  Continue current regimen  -07/01/23 LBM today, cont regimen  32. History of drug addiction: tramadol d/ced as per mom's preference  33. Depression: discussed scheduling for outpatient psychology/psychiatry follow-up, discussed neuropsych f/u while in hospital  -appreciate Dr. Marvetta Gibbons follow up  34. Sinusitis: claritin ordered prn, will d/c since not requiring  35. Pruritus: sarna lotion ordered, discussed that this is prn, will d/c since not requiring  26. Low back pain: XR ordered and discussed that it is negative, continue lidocaine patch, tramadol added prn, advised that she will have to ask for medication if she feels that she needs it, continue baclofen 5mg  HS  27. Polypharmacy: amantadine d/ced since no changes noted with it, continue current medications  28. Insomnia: change melatonin to prn, continue baclofen 5mg  HS  29. Muscle spasms: d/c baclofen given hypotension  30. Urinary retention: continue Myrbetriq 25mg  daily   LOS: 229 days A FACE TO FACE EVALUATION WAS PERFORMED  Deavion Dobbs P Farzana Koci 07/05/2023, 10:02 AM

## 2023-07-06 NOTE — NC FL2 (Signed)
[place] MEDICAID FL2 LEVEL OF CARE FORM     IDENTIFICATION  Patient Name: JENISSE VULLO Birthdate: 1975-10-27 Sex: female Admission Date (Current Location): 11/18/2022  [place] and [place] Number:  Haynes Bast [id number] Facility and Address:  [facility] [place], [address] [place], [place] [address]      Provider Number: [id number]  Attending Physician Name and Address:  Horton Chin, MD  Relative Name and Phone Number:  Joycie Peek [phone]    Current Level of Care: Other (Comment) (rehab) Recommended Level of Care: Skilled Nursing Facility Prior Approval Number:    Date Approved/Denied:   PASRR Number: [id number]  Discharge Plan: SNF    Current Diagnoses: Patient Active Problem List   Diagnosis Date Noted   History of cocaine abuse (HCC) 05/17/2023   Anxiety with depression 05/17/2023   Essential hypertension 01/29/2023   Pressure injury of skin 01/29/2023   Protein-calorie malnutrition, severe 01/05/2023   Hypoxic brain injury (HCC) 11/18/2022   Cavitary pneumonia 11/05/2022   Substance use 11/05/2022   Acute respiratory failure (HCC) 10/31/2022   Nonallergic rhinitis 08/23/2020   Mild persistent asthma without complication 06/14/2020   Other allergic rhinitis 06/14/2020   Adverse reaction to food, subsequent encounter 06/14/2020   IBS (irritable bowel syndrome) 05/24/2016   Chronic venous insufficiency 05/24/2016   Migraine 05/24/2016   Pulmonary nodules 10/27/2014   Hepatitis C, chronic (HCC)    Depression 10/09/2013   OSA on CPAP - sees Dr. Jenne Campus in ENT 10/09/2013   Allergic rhinitis 05/30/2012   Cough variant asthma 02/08/2012    Orientation RESPIRATION BLADDER Height & Weight     Self, Situation, Place  Normal Incontinent Weight: 137 lb 9.1 oz (62.4 kg) Height:  5\' 1"  (154.9 cm)  BEHAVIORAL SYMPTOMS/MOOD NEUROLOGICAL BOWEL NUTRITION STATUS      Continent Diet (regular thin liquids)  AMBULATORY  STATUS COMMUNICATION OF NEEDS Skin   Limited Assist Verbally Normal, Other (Comment) (skin has healed) PU Stage 1 Dressing: Daily PU Stage 2 Dressing: Daily                   Personal Care Assistance Level of Assistance  Bathing, Dressing, Feeding Bathing Assistance: Limited assistance Feeding assistance:  (set up) Dressing Assistance: Limited assistance Total Care Assistance: Limited assistance   Functional Limitations Info  Sight, Speech Sight Info: Impaired Hearing Info: Adequate Speech Info: Impaired    SPECIAL CARE FACTORS FREQUENCY  Bowel and bladder program     PT Frequency: 2-3 x week OT Frequency: 2-3 x week Bowel and Bladder Program Frequency: timed tolieting for bladder continence   Speech Therapy Frequency: 2-3 x week      Contractures Contractures Info: Not present    Additional Factors Info  Code Status, Allergies Code Status Info: Full code Allergies Info: [place] nuts, cashews, ceclor and nickel     Isolation Precautions Info: Off all precautions has been here a long while     Current Medications (07/06/2023):  This is the current hospital active medication list Current Facility-Administered Medications  Medication Dose Route Frequency Provider Last Rate Last Admin   acetaminophen (TYLENOL) tablet 650 mg  650 mg Oral Q6H PRN Charlton Amor, PA-C   650 mg at 07/04/23 0551   busPIRone (BUSPAR) tablet 5 mg  5 mg Oral Daily PRN Horton Chin, MD   5 mg at 07/02/23 0109   cholecalciferol (VITAMIN D3) 25 MCG (1000 UNIT) tablet 4,000 Units  4,000 Units Oral Daily Raulkar, Clint Bolder  P, MD   4,000 Units at 07/06/23 0845   lidocaine (LIDODERM) 5 % 2 patch  2 patch Transdermal Q24H Horton Chin, MD   2 patch at 07/06/23 0846   magnesium gluconate (MAGONATE) tablet 250 mg  250 mg Oral QHS Raulkar, Drema Pry, MD   250 mg at 07/05/23 2104   melatonin tablet 3 mg  3 mg Oral QHS PRN Horton Chin, MD   3 mg at 07/05/23 2104   mirabegron ER  (MYRBETRIQ) tablet 25 mg  25 mg Oral Daily Raulkar, Drema Pry, MD   25 mg at 07/06/23 1478   Oral care mouth rinse  15 mL Mouth Rinse PRN Ranelle Oyster, MD       selenium sulfide (SELSUN) 1 % shampoo   Topical PRN Horton Chin, MD   Given at 01/17/23 2056   traMADol (ULTRAM) tablet 50 mg  50 mg Oral Q12H PRN Horton Chin, MD   50 mg at 07/06/23 1400     Discharge Medications: Please see discharge summary for a list of discharge medications.  Relevant Imaging Results:  Relevant Lab Results:   Additional Information SSN: [id number]  Still awaiting for disability approval-still has [employer] managed medicaid  Meyah Corle, Lemar Livings, LCSW

## 2023-07-06 NOTE — Progress Notes (Signed)
PROGRESS NOTE   Subjective/Complaints: No new complaints this morning Appreciate SW getting onsite visit from [facility] No issues overnight   ROS: Denies CP, SOB, abd pain, N/V/D/C, or any other complaints at this time.  +low back pain- variable, but managable, +muscle spasms, +urinary incontinence    Objective:   No results found.    No results for input(s): "WBC", "HGB", "HCT", "PLT" in the last 72 hours.          No results for input(s): "NA", "K", "CL", "CO2", "GLUCOSE", "BUN", "CREATININE", "CALCIUM" in the last 72 hours.           Intake/Output Summary (Last 24 hours) at 07/06/2023 1427 Last data filed at 07/06/2023 1300 Gross per 24 hour  Intake 712 ml  Output --  Net 712 ml           Physical Exam: Vital Signs Blood pressure 94/69, pulse 86, temperature 98.3 F (36.8 C), temperature source Oral, resp. rate 18, height 5\' 1"  (1.549 m), weight 62.4 kg, SpO2 99%.  Constitutional: No distress . Vital signs reviewed. Resting in bed, comfortable HEENT: NCAT, EOMI, oral membranes moist Neck: supple Cardiovascular: RRR without murmur. No JVD    Respiratory/Chest: CTA Bilaterally without wheezes or rales. Normal effort GI/Abdomen: BS +, non-tender, non-distended, soft Ext: no clubbing, cyanosis, or edema Psych: pleasant and cooperative   Skin: + Scaling dried skin on bilateral heels Neurologic: alert, oriented to person, place. Fair insight Phonation and speech intelligability normal. STM deficits but improving. Minimal hypertonicity RUE and RLE. Hypersensitivity to left hand improved, Left sided strength improved, decreased range of motion and pain at end range of motion in left shoulder, stable 2/21  PRIOR EXAMS: Musculoskeletal:some limitations in right shoulder ROM, LB TTP     Assessment/Plan: 1. Functional deficits which require 3+ hours per day of interdisciplinary therapy in a  comprehensive inpatient rehab setting. Physiatrist is providing close team supervision and 24 hour management of active medical problems listed below. Physiatrist and rehab team continue to assess barriers to discharge/monitor patient progress toward functional and medical goals  Care Tool:  Bathing    Body parts bathed by patient: Face, Abdomen, Chest, Left upper leg, Right upper leg, Left arm, Front perineal area, Right arm, Right lower leg, Left lower leg, Buttocks   Body parts bathed by helper: Buttocks Body parts n/a: Right arm, Left arm, Front perineal area, Buttocks, Right upper leg, Left upper leg, Right lower leg, Left lower leg, Chest, Abdomen, Face   Bathing assist Assist Level: Supervision/Verbal cueing     Upper Body Dressing/Undressing Upper body dressing   What is the patient wearing?: Bra, Pull over shirt    Upper body assist Assist Level: Contact Guard/Touching assist    Lower Body Dressing/Undressing Lower body dressing      What is the patient wearing?: Underwear/pull up, Pants     Lower body assist Assist for lower body dressing: Supervision/Verbal cueing     Toileting Toileting Toileting Activity did not occur (Clothing management and hygiene only): N/A (no void or bm)  Toileting assist Assist for toileting: Supervision/Verbal cueing     Transfers Chair/bed transfer  Transfers assist  Chair/bed transfer activity  did not occur: Safety/medical concerns  Chair/bed transfer assist level: Supervision/Verbal cueing Chair/bed transfer assistive device: Armrests, Geologist, engineering   Ambulation assist   Ambulation activity did not occur: Safety/medical concerns  Assist level: Contact Guard/Touching assist Assistive device: Walker-rolling Max distance: 300'   Walk 10 feet activity   Assist  Walk 10 feet activity did not occur: Safety/medical concerns  Assist level: Contact Guard/Touching assist Assistive device: Walker-rolling    Walk 50 feet activity   Assist Walk 50 feet with 2 turns activity did not occur: Safety/medical concerns  Assist level: Contact Guard/Touching assist Assistive device: Walker-rolling    Walk 150 feet activity   Assist Walk 150 feet activity did not occur: Safety/medical concerns  Assist level: Contact Guard/Touching assist Assistive device: Walker-rolling    Walk 10 feet on uneven surface  activity   Assist Walk 10 feet on uneven surfaces activity did not occur: Safety/medical concerns   Assist level: Contact Guard/Touching assist Assistive device: Walker-rolling   Wheelchair     Assist Is the patient using a wheelchair?: Yes Type of Wheelchair: Manual    Wheelchair assist level: Supervision/Verbal cueing Max wheelchair distance: 174ft    Wheelchair 50 feet with 2 turns activity    Assist    Wheelchair 50 feet with 2 turns activity did not occur: Safety/medical concerns   Assist Level: Supervision/Verbal cueing   Wheelchair 150 feet activity     Assist  Wheelchair 150 feet activity did not occur: Safety/medical concerns   Assist Level: Supervision/Verbal cueing   Blood pressure 94/69, pulse 86, temperature 98.3 F (36.8 C), temperature source Oral, resp. rate 18, height 5\' 1"  (1.549 m), weight 62.4 kg, SpO2 99%.  Medical Problem List and Plan: 1. 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 Awaiting approval of disability. SW has continued to work on placement. We also have engaged leadership within hospital for options. Code status changed to full code  after obtaining patient consent -therapies are daily only SW note reviewed and we do not have any accepting facilities thus far Discussed that we are waiting to hear from disability Continue CIR  2.  Impaired mobility: d/c lovenox since ambulating >150 feet, continue SCDs, discussed that she still cannot turn safely  3. Diffuse pains: resolved, gabapentin d/ced, lidocaine patches for back pain, continue  4. History of anxiety: discussed with her mom that she feels this was a big risk factor for her accident, propanolol d/ced due to hypotension. Asked SW to place her on neuropsych list, Buspar ordered prn, continue this. Discussed placing outpatient referral for behavioral health f/u, discussed that Buspar has been helpful for her. Decreased buspar to daily prn, recreational therapy consulted  5. Impulsive, impaired cognition: renewed safety restraints. This patient is not capable of making decisions on her own behalf, messaged nursing to see if they feel she still needs restraints  6. Skin: eucerin cream BID -Buttock/sacrum--foam dressing, pressure relief, nutrition -stage 3 left heel, resolved  7. Fluids/Electrolytes/Nutrition: Routine in and outs with follow-up chemistries  -Eating  well with cueing   Upgraded to regular diet  -Vit D 4000U daily  8.  Cavitary right lower lobe pneumonia likely aspiration pneumonia/MRSA pneumonia.  Resolved    Latest Ref Rng & Units 05/30/2023    5:25 AM 05/16/2023    6:28 AM 05/02/2023    5:20 AM  CBC  WBC 4.0 - 10.5 K/uL 5.3  6.2  5.9   Hemoglobin 12.0 - 15.0 g/dL 65.7  84.6  96.2   Hematocrit 36.0 - 46.0 % 41.5  42.1  43.0   Platelets 150 - 400 K/uL 223  300  241      9.  History of drug abuse.  Positive cocaine on urine drug screen.  Provide counseling  10.  AKI/hypovolemia and ATN.  Resolved -BUN/creatinine stable 1/20  -Potassium 3.9 1/20    Latest Ref Rng & Units 05/30/2023    5:25 AM 05/16/2023    6:28 AM 05/02/2023    5:20 AM   BMP  Glucose 70 - 99 mg/dL 87  87  89   BUN 6 - 20 mg/dL 10  15  14    Creatinine 0.44 - 1.00 mg/dL 9.52  8.41  3.24   Sodium 135 - 145 mmol/L 137  136  138   Potassium 3.5 - 5.1 mmol/L 3.9  3.9  4.0   Chloride 98 - 111 mmol/L 107  105  109   CO2 22 - 32 mmol/L 25  22  23    Calcium 8.9 - 10.3 mg/dL 8.6  8.9  8.9     11.  Mild transaminitis with rhabdomyolysis.  Both resolved  12.  Hypotension:  D/c flomax, continue to monitor BP TID -2/1-16/25 BP fine/stable, cont monitoring  Vitals:   07/02/23 1928 07/03/23 0613 07/03/23 1345 07/03/23 2026  BP: 108/76 97/76 95/71  102/60   07/04/23 0506 07/04/23 1331 07/04/23 1951 07/05/23 0438  BP: 97/62 94/71 92/65  (!) 99/56   07/05/23 1449 07/05/23 1955 07/06/23 0649 07/06/23 1427  BP: 111/74 92/63 96/79  94/69     13. HTN: resolved  14. Impaired initiation: resolved, d/c amantadine  -Consider restart amantadine-- restarted 100mg  daily  15. Hyperglycemia: CBGs mildly elevated, d/ced checks. Prosource d/ced as per patient's request  16. Spasticity:   improved  17.  Dyskinetic movements in head and neck, discussed with team that these seem to restart when baclofen is reastarted  18. Cervical extensor weakness: improved, continue strengthening exercises  19. Bowel and bladder incontinence: continue bowel and bladder program. Flomax started for urgency-- d/c'd -06/24/23 pt frustrated with incontinence/how long it takes her to try to urinate; discussed that she's had a variety of meds tried with no significant difference; continue trying timed toileting, but wonder if outpatient urology f/up for urodynamic studies would be beneficial?  20. Fatigue: resolved, d/c b complex   21. MRSA nares positive: negative on repeat, precautions d/ced  22. Tachycardic: resolved  23. S/p fall: CT head reviewed and is stable  24. Bilateral foot drop: improved  25. Vaginal itching: resolved  26. Dysuria resolved  27. Poor sitting balance: continue  OT/PT, improving  28. Headaches: topamax 58m daily prn ordered, will d/c since not requiring  29. Drowsiness with tramadol: d/ced  30. IUD in place: Pt states she has IUD x75yrs  and believes it's supposed to be removed soon(chart review- Dr. Seymour Bars placed Mirena IUD on 07/01/19, good for 81yrs); needs outpatient follow-up with OB/GYN  31. Constipation: d/c baclofen, last BM 12/18, milk of magnesia ordered 12/20  -LBM 1/27, restart magnesium gluconate 250mg  HS  Continue current regimen  -07/01/23 LBM today, cont regimen  32. History of drug addiction: tramadol d/ced as per mom's preference  33. Depression: discussed scheduling for outpatient psychology/psychiatry follow-up, discussed neuropsych f/u while in hospital  -appreciate Dr. Marvetta Gibbons follow up  34. Sinusitis: claritin ordered prn, will d/c since not requiring  35. Pruritus: sarna lotion ordered, discussed that this is prn, will d/c since not requiring  26. Low back pain: XR ordered and discussed that it is negative, continue lidocaine patch, tramadol added prn, advised that she will have to ask for medication if she feels that she needs it, d/c baclofen given hypotension  27. Polypharmacy: amantadine d/ced since no changes noted with it, continue current medications  28. Insomnia: change melatonin to prn, d/c baclofen given hypotension  29. Muscle spasms: d/c baclofen given hypotension  30. Urinary retention: continue Myrbetriq 25mg  daily   LOS: 230 days A FACE TO FACE EVALUATION WAS PERFORMED  Nicole [name] 07/06/2023, 2:27 PM

## 2023-07-06 NOTE — Progress Notes (Addendum)
Patient ID: Nicole [name], female   DOB: 06/06/1975, 48 y.o.   MRN: [id number]  Have reached out to Lisa[facility]m of [facility] to inquire if could offer pt a bed. Left a message will await return call. Contacted Mom since did not come in due to weather and she is aware of the team conference update and continuation of working on bed placement  11:47 AM Spoke with Lenore Cordia from [place] she will try to do an on-site visit today. Wanted the LOG letter have completed and sent to [facility] and [facility] [place] since needs to be approved by [place]. Will let pt know of possibility of on-site visit today.   1:25 PM Spoke with Wandra Mannan regarding LOG reports his secretary is off until Monday but will do what is needed if [facility] can offer a bed today. Have left message with Misty Stanley regarding if can visit today or if needs to see LOG completed letter or blank one? Awaiting return call.  3;18 PM Have not heard back from [facility] and do no thinks she came to visit pt. Will follow up with on Monday. Have let Wandra Mannan know also.

## 2023-07-06 NOTE — Progress Notes (Signed)
Speech Language Pathology Weekly Progress and Session Note  Patient Details  Name: Nicole [name] MRN: [id number] Date of Birth: 01-Dec-1975  Beginning of progress report period: June 29, 2023 End of progress report period: July 06, 2023  Today's Date: 07/06/2023 SLP Individual Time: 1015-1100 SLP Individual Time Calculation (min): 45 min  Short Term Goals: Week 9: SLP Short Term Goal 1 (Week 9): Week 32 - Pt will solve mildly complex environmental problems with 80% accuracy given min assist. SLP Short Term Goal 1 - Progress (Week 9): Met SLP Short Term Goal 2 (Week 9): Week 32 - Pt will demonstrate emergent awareness of mistakes during functional environmental tasks and correct mistakes in 5/6 opportunities given min assist. SLP Short Term Goal 1 - Progress (Week 9): Progressing towards goal   New Short Term Goals: Week 9: SLP Short Term Goal 1 (Week 9): Week 33 - Pt will solve mildly complex environmental problems with 85% accuracy given min assist. SLP Short Term Goal 2 (Week 9): Week 33 - Pt will demonstrate emergent awareness of mistakes during functional environmental tasks and correct mistakes in 5/6 opportunities given min assist.  Weekly Progress Updates: Pt continues to make steady progress towards ST goals. She met 1/2 STGs this week. Impulsivity continues to negatively impact consistent emergent awareness, however, she remains on track to meet LTGs at this time. Pt/family education ongoing. She would benefit from continued ST to target cognitive-linguistic deficits, maximize pt independence, and reduce caregiver burden.    Intensity: Minumum of 1-2 x/day, 30 to 90 minutes Frequency: 1 to 3 out of 7 days Duration/Length of Stay: TBD due to placement Treatment/Interventions: Cognitive remediation/compensation;Internal/external aids;Speech/Language facilitation;Cueing hierarchy;Environmental controls;Therapeutic Activities;Functional tasks;Patient/family education;Therapeutic  Exercise   Daily Session  Skilled Therapeutic Interventions: Pt greeted at bedside. She was awake in bed upon SLP arrival. She independently ambulated to the ST office via rolling walker. Adequate pathfinding noted during walk to office.Once in the office, she completed a visual organzation/attention task via card matching. She benefited from minA cues for attention to details and to maintain slow rate. She also completed an organization task via calendar. She again required minA to maintain slow rate and assist w/ reduced errors. She was able to ID errors during 4/6 trials. At the end of tx tasks, she ambulated back to the room via rolling walker (independently) and required only s verbal cues for sequencing and awareness during transfer back to bed. Direct hand off completed with OT. Recommend cont ST per POC.     Pain  No pain reported.   Therapy/Group: Individual Therapy  [name] 07/06/2023, 3:50 PM

## 2023-07-06 NOTE — Progress Notes (Signed)
Occupational Therapy Session Note  Patient Details  Name: Nicole [name] MRN: [id number] Date of Birth: Dec 31, 1975  Today's Date: 07/06/2023 OT Individual Time: 1101-1200+ 1309-1403 OT Individual Time Calculation (min): 59 min    Short Term Goals: Week 9: OT Short Term Goal 1 (Week 9): Patient will walk to bathroom without device and close supervision OT Short Term Goal 1 - Progress (Week 9): Progressing toward goal OT Short Term Goal 2 (Week 9): Patient will don bra with no more than 3 verbal cues OT Short Term Goal 2 - Progress (Week 9): Met OT Short Term Goal 3 (Week 9): Patient will complete simple meal prep with min assist OT Short Term Goal 3 - Progress (Week 9): Not met OT Short Term Goal 4 (Week 9): xxx OT Short Term Goal 4 - Progress (Week 9): Other (comment) OT Short Term Goal 5 (Week 9): xxx OT Short Term Goal 5 - Progress (Week 9): Other (comment)  Skilled Therapeutic Interventions/Progress Updates:  Session 1: Pt greeted EOB with SLP present, pt agreeable to OT intervention but requesting coffee.   Transfers/bed mobility/functional mobility: pt completed all functional ambulation with Rw and supervision.   Therapeutic activity: pt engaged in seated games such as connect 4 and "guess who" to challenge problem solving new information, attention and memory for safety with ADLS.  - pt first engaged in connect 4 game with pt playing 5 games total. Pt unable to acknowledge who won any game without therapist bringing pts attention to 4 in a row even when it was the pt that won.  -pt then engaged in novel game of "guess who." Pt needed MAX cues to problem solve rules and formulate appropriate questions to be asked such as "what color hair does your person have?" Pt instead wanting to ask things such as "is your person tom?"   ADLs:  Pt completed ambulatory toilet transfer into bathroom with no AD and CGA. Pt with incontinent urine void needing supervision for 3/3 toileting tasks. Pt  able to don new brief and pants with MIN A needing assist only to thread LLE into pants d/t gripper socks.   Ended session with pt supine in bed with all needs within reach and bed alarm activated.                    Session 2: Pt greeted seated in w/c, pt agreeable to OT intervention.    Pt requested to have eye drops donned, alerted nurse who entered to apply eye drops.   Transfers/bed mobility/functional mobility: pt completed all functional ambulation with RW and CGA- supervision.   Therapeutic activity: pt completed various therapeutic activities to facilitate improved standing tolerance/balance, and improved overall strength/endurance.   - pt instructed to stand at table to duplicate geometric shape structure to challenge visual perceptual skills and problem solving for ADL participation. Pt required + time to problem solve correct placement of shapes with needing 2 choices provided to select correct placement at times.  - pt completed Sit>stands from EOM x10 with pt holding 3lb weighted bar at chest, MAX multimodal cues for sequencing and set- up as well as pace as pt tends to do everything fast - pt completed X10 chest presses in standing with 3lb weighted dowel with MAX cues for pace, sequencing and technique in order to facilitate improved strength,endurance and motor planning - worked on lateral stepping in // bars with pt instructed to step over cones in bars with a focus on improved motor  planning and dynamic balance.pt required MIN A for balance and MAX multimodal cues for sequencing and pace.  - pt instructed to stand on airex with unilateral support to reach out of BOS to match squigz on mirror to facilitate improved targeted reaching with pt alternating reaching with BUEs. Pt completed task with CGA for balance.    ADLs:  UB dressing:pt donned OH shirt with MINA, pt did require MODA to doff shirt with pt having difficulty problem solving how to remove LUE from shirt     Transfers: ambulatory toilet transfer with RW and CGA, pt unable to void b/b  IADLS: simulated IADL task of using spatulua to flip over discs to simulate higher level cooking task pt completed task with CGA for balance and MIN verbal cues for sequencing and technique.    Ended session with pt seated in w/c with all needs within reach and chair alarm activated.                    Therapy Documentation Precautions:  Precautions Precautions: Fall Precaution/Restrictions Comments: decreased stand balance- reliant on UE, Pain in LLE limits mobility at times Required Braces or Orthoses: Other Brace Other Brace: B adjustable night splints; B elbow "cosey" splints; L palm protector; R mitt Restrictions Weight Bearing Restrictions Per Provider Order: No  Pain: Session 1: no pain  Session 2: 6/10 pain reported in Left hip, nurse entered to provide pain meds.     Therapy/Group: Individual Therapy  [name] [facility] 07/06/2023, 12:22 PM

## 2023-07-06 NOTE — Progress Notes (Signed)
Physical Therapy Session Note  Patient Details  Name: Nicole [name] MRN: [id number] Date of Birth: 14-Jan-1976  Today's Date: 07/06/2023 PT Individual Time: 4696-2952 PT Individual Time Calculation (min): 27 min   Short Term Goals: Week 9: PT Short Term Goal 1 (Week 1): Pt will perform supine<>sit with supervision PT Short Term Goal 1 - Progress (Week 1): Other (comment) (new goal) PT Short Term Goal 2 (Week 1): Pt will ambulate at least 153ft with no more than min A, not using an AD. PT Short Term Goal 2 - Progress (Week 1): Other (comment) (new goal) PT Short Term Goal 3 (Week 1): Pt will demonstrate decreased fall risk as noted by an improvement of at least 7 points on the [employer] Test PT Short Term Goal 3 - Progress (Week 1): Other (comment) (new goal) PT Short Term Goal 4 (Week 1): Pt will navigate 4 steps using HRs with no more than CGA  Skilled Therapeutic Interventions/Progress Updates:     Pt received in room seated in [facility] and eager to work with PT. Pt dependently transported to main gym and 70' bout of ambulation completed with CGA for safety, gait slightly antalgic with decreased stance time on Lt LE and pt c/o Lt hip pain. Rt LE circumducting slightly and step height/clearance greater on Rt vs Lt.   Seated rest provided on EOM following gait bout and pt moves sit>supine with supervision. Palpation of Lt hip noted tightness and pain in hip flexors with tenderness in proximal rectus femoral and vastus lateralis. STM performed for 4 minutes with mild relief reported.   Pt amb short bout to // bars and step coordination challenged with colored dots and foot taps. Pt completed 2 rounds bil LE; cues to deter use of bars to challenge SLS balance. Pt with greater difficulty on Lt with decreased accuracy compared to Rt. Standing balance challenged with airex pad and pt standing without UE support for squigz removal from mirror. Pt compelted 2 rounds with squigz removal using Rt UE. Seated  rest in [facility] provided following 2 rounds with CGA to step off foam pad and step out of // bars to WC.   Cone weaving dynamic gait challenge completed. Pt unable to weave in smooth pattern and amb forward while lifting Rt LE and circumducting it around each cone on first bout. Min assist to prevent LOB with scissoring steps 1x. Second bout completed with cues for wide turns, Min assist to guide hips for Rt/Lt turn to fully rotate and weave between/around cones. Steps shorted and slightly more even with assist, pt continues with tendency to step Rt LE wide and circumferential pattern. Seated rest in [facility] following each bout.  EOS pt self propelled WC ~75' back to room and dependently wheeled remaining 88' for time and as pt getting distracted by surroundings and staff. Supervision for stand and CGA/sup for pivot WC>bed. Pt returned to supine in bed with supervision. Alarm on and call bell and all needs within reach.    Therapy Documentation Precautions:  Precautions Precautions: Fall Precaution/Restrictions Comments: decreased stand balance- reliant on UE, Pain in LLE limits mobility at times Required Braces or Orthoses: Other Brace Other Brace: B adjustable night splints; B elbow "cosey" splints; L palm protector; R mitt Restrictions Weight Bearing Restrictions Per Provider Order: No  Pain:  C/o pain in Lt hip 6/10, monitored throughout, limited session to tolerance.   Therapy/Group: Individual Therapy  Wynn Maudlin, DPT Acute Rehabilitation Services Office [phone]  07/06/23 12:58  PM

## 2023-07-07 MED ORDER — LEVALBUTEROL TARTRATE 45 MCG/ACT IN AERO
1.0000 | INHALATION_SPRAY | Freq: Four times a day (QID) | RESPIRATORY_TRACT | Status: DC | PRN
  Filled 2023-07-07: qty 15
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
## 2023-07-08 NOTE — Plan of Care (Signed)
  Problem: Consults Goal: RH STROKE PATIENT EDUCATION Description: See Patient Education module for education specifics  Outcome: Progressing   Problem: RH BOWEL ELIMINATION Goal: RH STG MANAGE BOWEL WITH ASSISTANCE Description: STG Manage Bowel with mod I Assistance. Outcome: Progressing Goal: RH STG MANAGE BOWEL W/MEDICATION W/ASSISTANCE Description: STG Manage Bowel with Medication with mod I Assistance. Outcome: Progressing   Problem: RH BLADDER ELIMINATION Goal: RH STG MANAGE BLADDER WITH ASSISTANCE Description: STG Manage Bladder With toileting Assistance Outcome: Progressing   Problem: RH SKIN INTEGRITY Goal: RH STG MAINTAIN SKIN INTEGRITY WITH ASSISTANCE Description: STG Maintain Skin Integrity With min  Assistance. Outcome: Progressing

## 2023-07-09 NOTE — Progress Notes (Addendum)
 Patient ID: Nicole [name], female   DOB: [birth year], 48 y.o.   MRN: [id number]  Spoke with Nicole [name] who did come and see pt on Friday. She needs the LOG letter to have her boos look at it and decide if can accept pt. Her boss is there today due to they have surveyors on-site. Have messaged Wandra Mannan to ask to complete the LOG letter for room and board and therapies and sent over to [facility] to see if can accept pt.  2:01 PM Have sent over LOG letter to [facility] to see if can offer pt a bed. Will await response from [place].

## 2023-07-09 NOTE — Progress Notes (Signed)
 Recreational Therapy Session Note  Patient Details  Name: Nicole [name] MRN: [id number] Date of Birth: 09/21/1975 Today's Date: 07/09/2023  Pain: no c/o Skilled Therapeutic Interventions/Progress Updates:  Goal:  Pt will participate in table top game of choice with mod cues for decreased cognition.  NOT MET  Pt requesting to play a board game this session.  Session focused on cognition including selective attention, memory and problem solving.  Pt taken to the dayroom where she chose to play "Sorry."  Pt was able to maintain selective attention for 40 minute session with subtle cues.  Pt required max cues for game play due to problem solving & memory deficits.  Pt would impulsively move game pieces along the board, without counting appropriately.  She would however, go back and restart with counting ~50% of the time. Therapy/Group: Individual Therapy   Sheamus Hasting 07/09/2023, 3:32 PM

## 2023-07-09 NOTE — Progress Notes (Signed)
 Physical Therapy Session Note  Patient Details  Name: Nicole [name] MRN: [id number] Date of Birth: 02/06/1976  Today's Date: 07/09/2023 PT Individual Time: 1450-1535 PT Individual Time Calculation (min): 45 min    Skilled Therapeutic Interventions/Progress Updates:     Pt received seated in [facility] an agrees to therapy. No complaint of pain. Sit to stand with cues for initiation and safety. Pt ambulates with RW, per her preference. Pt ambulates 2x300' with RW and close supervision, with cues for safe AD management and maintaining upright gaze to improve posture and balance.  Pt then completes Wii bowling activity to challenge dynamic standing balance, coordination, attention to task, and fine and gross motor skills. Pt completes majority of activity in standing with LUE support on RW, with cues for safety and correct performance. Pt takes seated rest breaks as needed, with cues for safe transition from standing back to chair. Pt ambulates to room with same assistance and cues. Pt left seated in WC with alarm intact and all needs within reach.   Therapy Documentation Precautions:  Precautions Precautions: Fall Precaution/Restrictions Comments: decreased stand balance- reliant on UE, Pain in LLE limits mobility at times Required Braces or Orthoses: Other Brace Other Brace: B adjustable night splints; B elbow "cosey" splints; L palm protector; R mitt Restrictions Weight Bearing Restrictions Per Provider Order: No   Therapy/Group: Individual Therapy  Beau Fanny, PT, DPT 07/09/2023, 3:36 PM

## 2023-07-09 NOTE — Plan of Care (Signed)
  Problem: Consults Goal: RH STROKE PATIENT EDUCATION Description: See Patient Education module for education specifics  Outcome: Progressing   Problem: RH BOWEL ELIMINATION Goal: RH STG MANAGE BOWEL WITH ASSISTANCE Description: STG Manage Bowel with mod I Assistance. Outcome: Progressing Goal: RH STG MANAGE BOWEL W/MEDICATION W/ASSISTANCE Description: STG Manage Bowel with Medication with mod I Assistance. Outcome: Progressing   Problem: RH BLADDER ELIMINATION Goal: RH STG MANAGE BLADDER WITH ASSISTANCE Description: STG Manage Bladder With toileting Assistance Outcome: Not Progressing Note: Patient continues to have episodes of incontinence regardless of myrbetriq. Patient still continues with episodes of urgency and frequency.  Patient is unable to adhere to timed toileting schedule.   Problem: RH SKIN INTEGRITY Goal: RH STG MAINTAIN SKIN INTEGRITY WITH ASSISTANCE Description: STG Maintain Skin Integrity With min  Assistance. Outcome: Progressing

## 2023-07-09 NOTE — Progress Notes (Signed)
 PROGRESS NOTE   Subjective/Complaints: No new complaints this morning Continues to have urinary retention/incontinence, has not had benefits from myrbetriq yet     ROS: Denies CP, SOB, abd pain, N/V/D/C, or any other complaints at this time.  +low back pain- variable, but managable, +muscle spasms, +urinary incontinence/retention    Objective:   No results found.    No results for input(s): "WBC", "HGB", "HCT", "PLT" in the last 72 hours.          No results for input(s): "NA", "K", "CL", "CO2", "GLUCOSE", "BUN", "CREATININE", "CALCIUM" in the last 72 hours.           Intake/Output Summary (Last 24 hours) at 07/09/2023 1046 Last data filed at 07/08/2023 1805 Gross per 24 hour  Intake 456 ml  Output --  Net 456 ml           Physical Exam: Vital Signs Blood pressure 103/74, pulse 77, temperature 98.3 F (36.8 C), temperature source Oral, resp. rate 16, height 5\' 1"  (1.549 m), weight 62.4 kg, SpO2 95%.  Constitutional: No distress . Vital signs reviewed. Resting in bed, comfortable HEENT: NCAT, EOMI, oral membranes moist Neck: supple Cardiovascular: RRR without murmur. No JVD    Respiratory/Chest: CTA Bilaterally without wheezes or rales. Normal effort GI/Abdomen: BS +, non-tender, non-distended, soft Ext: no clubbing, cyanosis, or edema Psych: pleasant and cooperative   Skin: + Scaling dried skin on bilateral heels-covered Neurologic: alert, oriented to person, place. Fair insight Phonation and speech intelligability normal. STM deficits but improving. Minimal hypertonicity RUE and RLE. Hypersensitivity to left hand improved, Left sided strength improved, decreased range of motion and pain at end range of motion in left shoulder, stable 2/24  Musculoskeletal:some limitations in right shoulder ROM, LB TTP     Assessment/Plan: 1. Functional deficits which require 3+ hours per day of  interdisciplinary therapy in a comprehensive inpatient rehab setting. Physiatrist is providing close team supervision and 24 hour management of active medical problems listed below. Physiatrist and rehab team continue to assess barriers to discharge/monitor patient progress toward functional and medical goals  Care Tool:  Bathing    Body parts bathed by patient: Face, Abdomen, Chest, Left upper leg, Right upper leg, Left arm, Front perineal area, Right arm, Right lower leg, Left lower leg, Buttocks   Body parts bathed by helper: Buttocks Body parts n/a: Right arm, Left arm, Front perineal area, Buttocks, Right upper leg, Left upper leg, Right lower leg, Left lower leg, Chest, Abdomen, Face   Bathing assist Assist Level: Supervision/Verbal cueing     Upper Body Dressing/Undressing Upper body dressing   What is the patient wearing?: Bra, Pull over shirt    Upper body assist Assist Level: Contact Guard/Touching assist    Lower Body Dressing/Undressing Lower body dressing      What is the patient wearing?: Underwear/pull up, Pants     Lower body assist Assist for lower body dressing: Supervision/Verbal cueing     Toileting Toileting Toileting Activity did not occur (Clothing management and hygiene only): N/A (no void or bm)  Toileting assist Assist for toileting: Supervision/Verbal cueing     Transfers Chair/bed transfer  Transfers assist  Chair/bed  transfer activity did not occur: Safety/medical concerns  Chair/bed transfer assist level: Supervision/Verbal cueing Chair/bed transfer assistive device: Armrests, Geologist, engineering   Ambulation assist   Ambulation activity did not occur: Safety/medical concerns  Assist level: Contact Guard/Touching assist Assistive device: Walker-rolling Max distance: 300'   Walk 10 feet activity   Assist  Walk 10 feet activity did not occur: Safety/medical concerns  Assist level: Contact Guard/Touching  assist Assistive device: Walker-rolling   Walk 50 feet activity   Assist Walk 50 feet with 2 turns activity did not occur: Safety/medical concerns  Assist level: Contact Guard/Touching assist Assistive device: Walker-rolling    Walk 150 feet activity   Assist Walk 150 feet activity did not occur: Safety/medical concerns  Assist level: Contact Guard/Touching assist Assistive device: Walker-rolling    Walk 10 feet on uneven surface  activity   Assist Walk 10 feet on uneven surfaces activity did not occur: Safety/medical concerns   Assist level: Contact Guard/Touching assist Assistive device: Walker-rolling   Wheelchair     Assist Is the patient using a wheelchair?: Yes Type of Wheelchair: Manual    Wheelchair assist level: Supervision/Verbal cueing Max wheelchair distance: 123ft    Wheelchair 50 feet with 2 turns activity    Assist    Wheelchair 50 feet with 2 turns activity did not occur: Safety/medical concerns   Assist Level: Supervision/Verbal cueing   Wheelchair 150 feet activity     Assist  Wheelchair 150 feet activity did not occur: Safety/medical concerns   Assist Level: Supervision/Verbal cueing   Blood pressure 103/74, pulse 77, temperature 98.3 F (36.8 C), temperature source Oral, resp. rate 16, height 5\' 1"  (1.549 m), weight 62.4 kg, SpO2 95%.  Medical Problem List and Plan: 1. 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 Awaiting approval of disability. SW has continued to work on placement. We also have engaged leadership within hospital for  options. Code status changed to full code after obtaining patient consent -therapies are daily only SW note reviewed and we do not have any accepting facilities thus far Discussed that we are waiting to hear from disability Continue CIR  2.  Impaired mobility: d/c lovenox since ambulating >150 feet, continue SCDs, discussed that she still cannot turn safely  3. Diffuse pains: resolved, gabapentin d/ced, lidocaine patches for back pain, continue  4. History of anxiety: discussed with her mom that she feels this was a big risk factor for her accident, propanolol d/ced due to hypotension. Asked SW to place her on neuropsych list, Buspar ordered prn, continue this. Discussed placing outpatient referral for behavioral health f/u, discussed that Buspar has been helpful for her. Decreased buspar to daily prn, recreational therapy consulted  5. Impulsive, impaired cognition: renewed safety restraints. This patient is not capable of making decisions on her own behalf, messaged nursing to see if they feel she still needs restraints  6. Skin: eucerin cream BID -Buttock/sacrum--foam dressing, pressure relief, nutrition -stage 3 left heel, resolved  7. Fluids/Electrolytes/Nutrition: Routine in and outs with follow-up chemistries  -Eating  well with cueing   Upgraded to regular diet  -Vit D 4000U daily  8.  Cavitary right lower lobe pneumonia likely aspiration pneumonia/MRSA pneumonia.  Resolved; had xopenex inhaler in room, expired, reordered 2/22 for PRN use    Latest Ref Rng & Units 05/30/2023    5:25 AM 05/16/2023    6:28 AM 05/02/2023    5:20 AM  CBC  WBC 4.0 - 10.5 K/uL 5.3  6.2  5.9   Hemoglobin 12.0 - 15.0 g/dL 16.1  09.6  04.5   Hematocrit 36.0 - 46.0 % 41.5  42.1  43.0   Platelets 150 - 400 K/uL 223  300  241      9.  History of drug abuse.  Positive cocaine on urine drug screen.  Provide counseling  10.  AKI/hypovolemia and ATN.  Resolved -BUN/creatinine stable  1/20  -Potassium 3.9 1/20  -07/08/23 no labs in over a month, will order labs for tomorrow    Latest Ref Rng & Units 05/30/2023    5:25 AM 05/16/2023    6:28 AM 05/02/2023    5:20 AM  BMP  Glucose 70 - 99 mg/dL 87  87  89   BUN 6 - 20 mg/dL 10  15  14    Creatinine 0.44 - 1.00 mg/dL 4.09  8.11  9.14   Sodium 135 - 145 mmol/L 137  136  138   Potassium 3.5 - 5.1 mmol/L 3.9  3.9  4.0   Chloride 98 - 111 mmol/L 107  105  109   CO2 22 - 32 mmol/L 25  22  23    Calcium 8.9 - 10.3 mg/dL 8.6  8.9  8.9     11.  Mild transaminitis with rhabdomyolysis.  Both resolved  12.  Hypotension:  D/c flomax, continue to monitor BP TID -2/22-23/25 BP fine/stable, cont monitoring  Vitals:   07/05/23 1955 07/06/23 0649 07/06/23 1427 07/06/23 1923  BP: 92/63 96/79 94/69  114/71   07/07/23 0316 07/07/23 1516 07/07/23 2140 07/07/23 2141  BP: 106/71 102/72 (!) 89/60 (!) 93/55   07/08/23 0543 07/08/23 1631 07/08/23 2009 07/09/23 0453  BP: 97/74 107/72 124/81 103/74     13. HTN: resolved  14. Impaired initiation: resolved, d/c amantadine  -Consider restart amantadine-- restarted 100mg  daily-- d/c'd  15. Hyperglycemia: CBGs mildly elevated, d/ced checks. Prosource d/ced as per patient's request  16. Spasticity:   improved  17.  Dyskinetic movements in head and neck, discussed with team that these seem to restart when baclofen is reastarted  18. Cervical extensor weakness: improved, continue strengthening exercises  19. Bowel and bladder incontinence: continue bowel and bladder program. Flomax started for urgency-- d/c'd -06/24/23 pt frustrated with incontinence/how long it takes her to try to urinate; discussed that she's had a variety of meds tried with no significant difference; continue trying timed toileting, but wonder if outpatient urology f/up for urodynamic studies would be beneficial? -07/08/23 appears she was started on myrbetriq 25mg  daily (see below)  20. Fatigue: resolved, d/c b  complex   21. MRSA nares positive: negative on repeat, precautions d/ced  22. Tachycardic: resolved  23. S/p fall: CT head reviewed and is stable  24. Bilateral foot drop: improved  25. Vaginal itching: resolved  26. Dysuria resolved  27. Poor sitting balance: continue OT/PT, improving  28. Headaches: topamax 62m daily prn ordered, will d/c since not requiring  29. Drowsiness with tramadol: d/ced  30. IUD in place: Pt states she has IUD x38yrs  and believes it's supposed to be removed soon(chart review- Dr. Seymour Bars placed Mirena IUD on 07/01/19, good for  63yrs); needs outpatient follow-up with OB/GYN  31. Constipation: d/c baclofen, last BM 12/18, continue magnesium gluconate HS  32. History of drug addiction: tramadol d/ced as per mom's preference  33. Depression: discussed scheduling for outpatient psychology/psychiatry follow-up, discussed neuropsych f/u while in hospital  -appreciate Dr. Marvetta Gibbons follow up  34. Sinusitis: claritin ordered prn, will d/c since not requiring  35. Pruritus: sarna lotion ordered, discussed that this is prn, will d/c since not requiring  26. Low back pain: XR ordered and discussed that it is negative, continue lidocaine patch, tramadol added prn, advised that she will have to ask for medication if she feels that she needs it, d/c baclofen given hypotension  27. Polypharmacy: amantadine d/ced since no changes noted with it, continue current medications  28. Insomnia: change melatonin to prn, d/c baclofen given hypotension, continue melatonin prn  29. Muscle spasms: d/c baclofen given hypotension  30. Urinary retention: continue Myrbetriq 25mg  daily, discussed that it can take several weeks to work  LOS: 233 days A FACE TO FACE EVALUATION WAS PERFORMED  Drema Pry Idalis Hoelting 07/09/2023, 10:46 AM

## 2023-07-09 NOTE — Progress Notes (Addendum)
 Speech Language Pathology Daily Session Note  Patient Details  Name: Nicole [name] MRN: [id number] Date of Birth: 1976-01-03  Today's Date: 07/09/2023 SLP Individual Time: 0802-0902 SLP Individual Time Calculation (min): 60 min  Short Term Goals: Week 9: SLP Short Term Goal 1 (Week 9): Week 33 - Pt will solve mildly complex environmental problems with 85% accuracy given min assist. SLP Short Term Goal 2 (Week 9): Week 33 - Pt will demonstrate emergent awareness of mistakes during functional environmental tasks and correct mistakes in 5/6 opportunities given min assist.  Skilled Therapeutic Interventions:  Patient was seen in am to address cognitive re- training. Pt was alert and encountered in her room where she was upright in WC. She was indep oriented to temporal concepts including previous and upcoming months as well as holidays. Nursing in and out providing medication management. Pt ambulated via rolling walker to speech therapy office. Supervision cues for slowing pace and directions. Pt challenged in continuation of a task initiated during last session with this SLP. Given goal of identifying a hypothetical apartment, pt identified 5 nonnegotiable's given her current physical and cognitive barriers. Pt requiring min to mod A for task completion. She was subsequently challenged to complete a mildly complex task to achieve a predetermined goal. Pt requiring min A throughout task to attend to criteria with overall intellectual awareness of mistakes. At conclusion of session, pt ambulated back to her room via RW. Pt  left in [facility] with call button within reach. SLP to continue POC.   Pain Pain Assessment Pain Scale: 0-10 Pain Score: 0-No pain  Therapy/Group: Individual Therapy  [name] 07/09/2023, 12:46 PM

## 2023-07-09 NOTE — Progress Notes (Signed)
 Occupational Therapy Session Note  Patient Details  Name: Nicole [name] MRN: [id number] Date of Birth: 1975-07-01  Today's Date: 07/09/2023 OT Individual Time: 1120-1200 OT Individual Time Calculation (min): 40 min    Short Term Goals: OT Short Term Goal 1 (Week 1): Patient will brush hair and put into ponytail with no more than set up assistance OT Short Term Goal 1 - Progress (Week 1): Other (comment) OT Short Term Goal 2 (Week 1): Patient will complete simple snack prep at ambulation level and close supervision OT Short Term Goal 2 - Progress (Week 1): Other (comment) OT Short Term Goal 3 (Week 1): Patient will reach with LUE over 110* shoulder flexion without lateral trunk lean to obtain a lightweight object from shelf.  Skilled Therapeutic Interventions/Progress Updates:     Pt received sitting up in wc at sink brushing her teeth upon OT arrival. Pt presenting to be in good spirits receptive to skilled OT session reporting 0/10 pain- OT offering intermittent rest breaks, repositioning, and therapeutic support to optimize participation in therapy session. Focus this session BADL retraining. Offered to assist Pt to bathroom to adhere to timed toileting schedule with Pt receptive reporting she had already voided in her brief and would like to get cleaned up. Re-educated Pt on importance of calling for help when needing to use restroom with Pt verbalizing understanding. Pt completed functional mobility to bathroom using RW with close supervision and doffed pants in seated position with min verbal cues required for problem solving. Pt able to complete peri-care and doff clean brief/pants/shoes while seated on toilet and standing while holding grab bars with CGA +min verbal cues for safety. Pt then stood at sink to wash hands using RW for balance close supervision. Engaged Pt in completing functional mobility to rehab apartment >150 ft with Pt able to complete with close supervision using RW. Engaged  Pt in completing functional cognition meal preparation activity to increased overall independence and safety in simple meal preparation activities. Pt tasked with sequencing through steps required to make snack of choice (microwave popcorn) while ambulating through apartment to retrieve items. Pt able to complete activity without LOB close supervision. Mod verbal cues required for attention as Pt would become easily distracted by other objects in kitchen and for safety to adhere to suggested time to cook, position to place bag, and position of where to pick up bag once finished. Pt presenting with appropriate sequencing skills during activity and improved object manipulation skills opening packages with supervision only. Pt ambulated back to room using RW with close supervision. Pt was left resting in wc with call bell in reach, chair alarm on, and all needs met.    Therapy Documentation Precautions:  Precautions Precautions: Fall Precaution/Restrictions Comments: decreased stand balance- reliant on UE, Pain in LLE limits mobility at times Required Braces or Orthoses: Other Brace Other Brace: B adjustable night splints; B elbow "cosey" splints; L palm protector; R mitt Restrictions Weight Bearing Restrictions Per Provider Order: No  Therapy/Group: Individual Therapy  KENZA MUNAR 07/09/2023, 8:01 AM

## 2023-07-09 NOTE — Progress Notes (Signed)
 Physical Therapy Session Note  Patient Details  Name: Nicole [name] MRN: [id number] Date of Birth: 20-Feb-1976  Today's Date: 07/09/2023 PT Individual Time: 4098-1191 PT Individual Time Calculation (min): 44 min   Short Term Goals: Week 1:  PT Short Term Goal 1 (Week 1): Pt will perform supine<>sit with supervision PT Short Term Goal 1 - Progress (Week 1): Other (comment) (new goal) PT Short Term Goal 2 (Week 1): Pt will ambulate at least 177ft with no more than min A, not using an AD. PT Short Term Goal 2 - Progress (Week 1): Other (comment) (new goal) PT Short Term Goal 3 (Week 1): Pt will demonstrate decreased fall risk as noted by an improvement of at least 7 points on the [employer] Test PT Short Term Goal 3 - Progress (Week 1): Other (comment) (new goal) PT Short Term Goal 4 (Week 1): Pt will navigate 4 steps using HRs with no more than CGA  Skilled Therapeutic Interventions/Progress Updates:    Session focused on outdoor mobility for community re-entry, NMR for balance retraining, coordination and gait,  and hip and low back stretching for pain management and increased functional mobility.   Community re-entry and Theatre stage manager with RW outside (> 500') with focus on safety awareness on uneven surfaces, navigating on/off elevator, pathfinding (from unit <> outside), and stair negotiation outside with single rail. Pt impulsive throughout requiring CGA overall for balance and safety with RW navigating uneven surfaces. CGA to min assist needed on stairs with L rail due to cues for safe foot placement and balance overall. Transfers on park like bench with supervision.  NMR for balance/coordination/dynamic gait with cones with goal for targeted toe taps and stepping over with LLE - pt difficulty with slowing down enough to complete task without running over cone with either RW or her own feet. Transitioned to lateral sidestepping without device. Despite multiple attempts and cues,  difficulty with following lateral sidestepping in open spaces as just wants to turn her entire body. In parallel bars (narrowed physical space and mirror for visual feedback), pt able to isolate lateral sidestepping to the R and L. Attempted again in open space, but difficulty with carryover.  Pt complaining of LLE discomfort in hip. In supine, performed single knee to chest, LLE crossover R, and trunk rotation in hooklying position all x 3-5 reps each with cues for holding stretch - pt attention only would hold about 10 sec each time. Pt reports overall this felt good and helped. Encouraged that she could do this in her bed in the room as well.  Returned to room via RW with overall CGA with cues for safe use of RW (pushes and lifts in exaggerated way). Pt states she needed to use the bathroom. Transferred to toilet with CGA with cues needed for sequencing with taking pants off as she wanted to change into pull up vs brief (it was not soiled). Difficulty problem solving need to take of shoes and pants to get pull up style brief on (tried stepping out of shoes with pants near her ankles first). Assist with clothing mangement sequence but allowed her to manage clothing. Requires assist with threading due to tighter leggings. Pt able to pull up her pull up and her pants in standing with supervision and donned shoes with supervision in standing. Transferred to her w/c with all needs in reach, chair alarm on, and mom at bedside.   Therapy Documentation Precautions:  Precautions Precautions: Fall Precaution/Restrictions Comments: decreased stand balance- reliant  on UE, Pain in LLE limits mobility at times Restrictions Weight Bearing Restrictions Per Provider Order: No    Pain: Denies pain initially. Pt reports some LLE discomfort during session - relieved with some stretching.    Therapy/Group: Individual Therapy  Karolee Stamps Darrol Poke, PT, DPT, CBIS  07/09/2023, 1:56 PM

## 2023-07-10 MED ORDER — CARBAMIDE PEROXIDE 6.5 % OT SOLN
5.0000 [drp] | Freq: Two times a day (BID) | OTIC | Status: AC
  Administered 2023-07-10 – 2023-07-13 (×6): 5 [drp] via OTIC
  Filled 2023-07-10: qty 15

## 2023-07-10 NOTE — Progress Notes (Signed)
 Occupational Therapy Session Note  Patient Details  Name: Nicole [name] MRN: [id number] Date of Birth: Jan 21, 1976  Today's Date: 07/10/2023 OT Individual Time: 1405-1500 OT Individual Time Calculation (min): 55 min   See OT POC for STG  Skilled Therapeutic Interventions/Progress Updates:    Pt received sitting in the w/c with un-rated "mild" back pain, agreeable to increase mobility as pain intervention. She stood from the w/c with (S) and completed 100 ft of functional mobility to the therapy gym with (S). She sat on a therapy ball to challenge dynamic sitting balance and core stabilization, and simultaneously completed a connect 4 game. She demonstrated appropriate knowledge of the game and won 1/2 games against OT. She required min A overall to maintain sitting balance on the ball, mainly d/t poor selective attention and not realizing she was sliding off the ball forward. She then completed dual processing task- walking while carrying a board with cones balanced on it. She demonstrated impulsivity and poor selective attention that required mod cueing for pacing and body mechanics. Pt completed blocked practice sit <> stand, 3x10 repetitions with overhead reach holding a 6lb dumbbell, to challenge generalized strengthening for ADL transfers, as well as increasing functional activity tolerance and cardiorespiratory endurance. Pt required (S) but had poor pacing and rushed through activity with frequent intervention to reduce trunk compensations. She completed several activities from quadruped and prone on the mat, working on gross motor coordination, core stabilization, and BUE strengthening. She had a great difficulty performing prone pushups specifically. Worked on trunk mobilization and quadruped rotations to also address cervical AROM. She required min-mod facilitation throughout all activities. She returned to her room and completed toileting tasks. She had no urine void. She changed depends with min  A. She was left sitting up in the w/c with all needs met chair alarm set.    Therapy Documentation Precautions:  Precautions Precautions: Fall Precaution/Restrictions Comments: decreased stand balance- reliant on UE, Pain in LLE limits mobility at times Required Braces or Orthoses: Other Brace Other Brace: B adjustable night splints; B elbow "cosey" splints; L palm protector; R mitt Restrictions Weight Bearing Restrictions Per Provider Order: No  Therapy/Group: Individual Therapy  Crissie Reese 07/10/2023, 6:32 AM

## 2023-07-10 NOTE — Progress Notes (Signed)
 Physical Therapy Session Note  Patient Details  Name: Nicole [name] MRN: [id number] Date of Birth: 06-25-1975  Today's Date: 07/10/2023 PT Individual Time: 4098-1191 PT Individual Time Calculation (min): 41 min   Short Term Goals: Week 1:  PT Short Term Goal 1 (Week 1): Pt will perform supine<>sit with supervision PT Short Term Goal 1 - Progress (Week 1): Other (comment) (new goal) PT Short Term Goal 2 (Week 1): Pt will ambulate at least 111ft with no more than min A, not using an AD. PT Short Term Goal 2 - Progress (Week 1): Other (comment) (new goal) PT Short Term Goal 3 (Week 1): Pt will demonstrate decreased fall risk as noted by an improvement of at least 7 points on the [employer] Test PT Short Term Goal 3 - Progress (Week 1): Other (comment) (new goal) PT Short Term Goal 4 (Week 1): Pt will navigate 4 steps using HRs with no more than CGA  Skilled Therapeutic Interventions/Progress Updates:  Patient seated upright in w/c on entrance to room. Patient alert and agreeable to PT session.   Patient with no pain complaint at start of session.  Pt relates she smells bad. Questioning cue for what she would normally do if she smells. Pt relates she would clean self. Another question cue leading pt to relate if she wanted to take a shower. Pt states, "That's a great idea. Thank you." Repeats "thank you correctly but excessively throughout session.   Therapeutic Activity: Pt able to ambulate room using RW and collect clothing, underwear and other needed items for shower. Assisted pt with carry of items into bathroom.   Requires guidance in safe seated position to doff clothing. Is able to doff most with supervision but unable to problem solve taking shoes off first and then taking of pants and socks.   Requires vc throughout shower to complete all steps. Completes with supervision overall and MinA for washing back, combing conditioner through hair.  Again requires supervision for cues in  sequencing safe seated/ standing positions for donning of new clothing for day. Provided with MinA for threading pants on each LE. Setup for shoes.   Ambulated out of room using RW and close supervision. Reaches shower board for nursing. Once chart explained to pt and laid out, pt is able to locate column for day of week and row for room number to locate correct cell to fill out.   Patient seated upright in w/c at end of session with brakes locked, belt alarm set, and all needs within reach.   Therapy Documentation Precautions:  Precautions Precautions: Fall Precaution/Restrictions Comments: decreased stand balance- reliant on UE, Pain in LLE limits mobility at times Required Braces or Orthoses: Other Brace Other Brace: B adjustable night splints; B elbow "cosey" splints; L palm protector; R mitt Restrictions Weight Bearing Restrictions Per Provider Order: No General:   Vital Signs: Therapy Vitals Temp: 98.2 F (36.8 C) Temp Source: Oral Pulse Rate: 73 Resp: 18 BP: 100/70 Patient Position (if appropriate): Lying Oxygen Therapy SpO2: 98 % O2 Device: Room Air Patient Activity (if Appropriate): In bed Pain: No pain related by pt.   Therapy/Group: Individual Therapy  Loel Dubonnet PT, DPT, CSRS 07/10/2023, 8:00 AM

## 2023-07-10 NOTE — Progress Notes (Addendum)
 Patient ID: Nicole [name], female   DOB: Mar 05, 1976, 48 y.o.   MRN: [id number] Still waiting response from [facility] regarding ability to offer a bed. Talking with Wandra Mannan regarding accepting under difficult to place pt's. Await word from him and administrator of [facility]  3;12 PM Continue to await decision regarding financials for pt to possibly go to [place]. Awaiting word from [facility].

## 2023-07-10 NOTE — Progress Notes (Signed)
 Speech Language Pathology Daily Session Note  Patient Details  Name: Nicole [name] MRN: [id number] Date of Birth: Sep 24, 1975  Today's Date: 07/10/2023 SLP Individual Time: 4098-1191 SLP Individual Time Calculation (min): 37 min  Short Term Goals: Week 9: SLP Short Term Goal 1 (Week 9): Week 33 - Pt will solve mildly complex environmental problems with 85% accuracy given min assist. SLP Short Term Goal 2 (Week 9): Week 33 - Pt will demonstrate emergent awareness of mistakes during functional environmental tasks and correct mistakes in 5/6 opportunities given min assist.  Skilled Therapeutic Interventions:  Patient was seen in PM to address cognitive re- training. Direct handoff from OT with pt alert and upright in WC. Pt agreeable for session and transition to SLP office however, requesting to remain in [facility] due to pain. Pt requesting to stop at nursing station for medication administration with RN assisting. Pt transitioned to speech therapy office after for completion of session. SLP concluded mildly complex apartment hunting task which had been initiated in previous sessions. Utilizing an external visual aid pt challenged in task of deductive reasoning, planning, and attention. Pt requiring min A for identifying necessary information and completion of task. In additional minutes of session, SLP challenged pt in verbal problem solving. Given scenarios consistent with a less restrictive environment, pt challenged to identify solutions. Pt completed task with 67% acc indep improving to 100% with min A. At conclusion of session, pt requesting to use the bathroom. Pt wheeled back to her room in [facility] due to expression of urgency. Pt found to be incontinent of bladder. SLP assisted in clean up and donning new brief, socks, and pants. Pt noted with difficulty removing garments however did not indep request help. In preparation to return back to her room pt challenged to recall what she needed to maneuver safely  with pt requiring min A to identify need for an AD. Pt returned to her bed where call button was placed within reach and bed alarm activated. SLP to continue POC.  Pain Pain Assessment Pain Scale: 0-10 Pain Score: 8  Pain Type: Chronic pain Pain Location: Back Pain Orientation: Lower Pain Onset: On-going Pain Intervention(s): Medication (See eMAR) Multiple Pain Sites: No  Therapy/Group: Individual Therapy  [name] 07/10/2023, 3:41 PM
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
## 2023-07-10 NOTE — Progress Notes (Signed)
 Physical Therapy Session Note  Patient Details  Name: BRIANCA FORTENBERRY MRN: [id number] Date of Birth: 03/10/1976  Today's Date: 07/10/2023 PT Individual Time: 1000-1045 PT Individual Time Calculation (min): 45 min   Short Term Goals: Week 1:  PT Short Term Goal 1 (Week 1): Pt will perform supine<>sit with supervision PT Short Term Goal 1 - Progress (Week 1): Other (comment) (new goal) PT Short Term Goal 2 (Week 1): Pt will ambulate at least 125ft with no more than min A, not using an AD. PT Short Term Goal 2 - Progress (Week 1): Other (comment) (new goal) PT Short Term Goal 3 (Week 1): Pt will demonstrate decreased fall risk as noted by an improvement of at least 7 points on the [employer] Test PT Short Term Goal 3 - Progress (Week 1): Other (comment) (new goal) PT Short Term Goal 4 (Week 1): Pt will navigate 4 steps using HRs with no more than CGA Week 2:  PT Short Term Goal 1 (Week 2): Week 16: Pt will complete bed mobility with modA consistently PT Short Term Goal 2 (Week 2): Week 16: Pt will complete bed<>Chair transfers wtith maxA PT Short Term Goal 3 (Week 2): Week 16: Pt will propel wheelchair 47ft with modA  Skilled Therapeutic Interventions/Progress Updates:    Session focused on functional transfers and gait with and without AD, NMR for balance retraining without UE support, and functional tasks in pt room. Pt performed basic transfers with close supervision. Pt set up at sink finishing combing her hair and using hair tie incorportating her LUE - pt able to manipulate hair and hairtie today (PT held pony tail for support only). Put away items in room without AD for functional gait with CGA overall balance and cues for attention to obstacles including reaching down to pick up item off of floor. Gait in hallway with RW per pt request with her goal to focus on "keeping her L hip straight" with close supervision to occasional CGA during longer distance ambulation > 150'. In gym, focused on  dual task activity to walk back and forth without AD to collect appropriate colored clothespin and put on line indicated by matching color - cues needed for locating items at times and to remember to go by color as instructed - mobility with CGA to close supervision. Fine motor tasks to don gloves, wipe items and place back in bin.   Pt performed supine stretching as reviewed yesterday - pt able to recall 1/3 stretches but performed with better technique today and more patience. (Trunk rotation/windshied wipers, single knee to chest, and leg crossed over stretch x 3 reps each).   Wii Dance for balance retraining without UE support following along to guided dance program - combination of pt following commands of therapist, on screen, and her own modifications - CGA for balance and pt reports really enjoying the activity. End of session returned to room with all needs in reach and safety belt donned.   Pt continues to demo decreased safety awareness and anticipatory awareness with functional mobility and tasks.   Therapy Documentation Precautions:  Precautions Precautions: Fall Precaution/Restrictions Comments: decreased stand balance- reliant on UE, Pain in LLE limits mobility at times Required Braces or Orthoses: Other Brace Restrictions Weight Bearing Restrictions Per Provider Order: No    Pain: No complaints of pain.    Therapy/Group: Individual Therapy  Karolee Stamps Darrol Poke, PT, DPT, CBIS  07/10/2023, 12:12 PM

## 2023-07-10 NOTE — Progress Notes (Signed)
 Patient refuse SCD's states that she doesn't want them because they hurt  her legs. Support provided

## 2023-07-11 NOTE — Progress Notes (Signed)
 Occupational Therapy Session Note  Patient Details  Name: Nicole [name] MRN: [id number] Date of Birth: 12-20-1975  Today's Date: 07/11/2023 OT Individual Time: 1335-1414 OT Individual Time Calculation (min): 39 min    Short Term Goals: See OT POC for STG  Skilled Therapeutic Interventions/Progress Updates:     Pt received sitting up in wc brushing teeth at sink upon OT arrival with [name] present in room. Pt presenting to be in good spirits receptive to skilled OT session reporting pain in low back without numerical value given- OT offering intermittent rest breaks, repositioning, and therapeutic support to optimize participation in therapy session. Focus this session functional mobility training, safety awareness, and functional cognition. Engaged Pt in hospital navigation activity to work on problem solving, attention, and awareness deficits. Pt instructed to utilize maps and signs in hospital to navigate to hospital gift shop. Pt required mod questioning cues overall to locate and utilize hospital maps and to correctly follow directions listed on maps. Pt then transported to outdoor location in wc to work on Armed forces logistics/support/administrative officer and participate in functional mobility training. Pt completed functional mobility using RW across uneven terrain, over curbs, and up/down inclines with close supervision required overall for task. Pt presenting with safety awareness deficits during activity requiring mod verbal cues to stay on side walk and avoid walking too close to car in hospital breezeway. At end of session, Pt able to correctly recall location of room and utilize hospital maps to navigate back to room with min questioning cues. Pt complete w/c propulsion with supervision for ~200 ft when navigating back to room and transported remainder of the way total A in wc for time management and energy conservation. Pt was left resting in wc with call bell in reach, chair alarm on, and all needs met.     Therapy Documentation Precautions:  Precautions Precautions: Fall Precaution/Restrictions Comments: decreased stand balance- reliant on UE, Pain in LLE limits mobility at times Required Braces or Orthoses: Other Brace Other Brace: B adjustable night splints; B elbow "cosey" splints; L palm protector; R mitt Restrictions Weight Bearing Restrictions Per Provider Order: No   Therapy/Group: Individual Therapy  Nicole [name] 07/11/2023, 2:06 PM

## 2023-07-11 NOTE — Patient Care Conference (Signed)
 Inpatient RehabilitationTeam Conference and Plan of Care Update Date: 07/11/2023   Time: 11:04 AM    Patient Name: Nicole [name]      Medical Record Number: [id number]  Date of Birth: 10/14/1975 Sex: Female         Room/Bed: 4W09C/4W09C-01 Payor Info: Payor: [employer] / Plan: [employer] / Product Type: *No Product type* /    Admit Date/Time:  11/18/2022  3:15 PM  Primary Diagnosis:  Hypoxic brain injury [facility])  Hospital Problems: Principal Problem:   Hypoxic brain injury (HCC) Active Problems:   Protein-calorie malnutrition, severe   Essential hypertension   Pressure injury of skin   History of cocaine abuse (HCC)   Anxiety with depression    Expected Discharge Date: Expected Discharge Date:  (SNF pending)  Team Members Present: Physician leading conference: Dr. Sula Soda Social Worker Present: Dossie Der, LCSW Nurse Present: Chana Bode, RN PT Present: Blima Rich, PT OT Present: Mariann Barter, OT SLP Present: [name], SLP PPS Coordinator present : Fae Pippin, SLP     Current Status/Progress Goal Weekly Team Focus  Bowel/Bladder   Remains incontinent , Last BM,07/10/2023,   Decrease episodes of incontinent episodes   Coninue time toiletinig, reinforcing with staff Q2 during day shift and Q4hrs during night shift especially prior to bedtime    Swallow/Nutrition/ Hydration               ADL's   continues with impulsivity, min-CGA for LB, set-up and cues for UB, close S for RW in room d/t tight spaces and obastacles, working on set-up of routine and hair management   goals supervision - contact guard   still with back pain but participates consistently, barrier is dispo    Mobility   no change - CGA to supervision - working on ambulating without an AD   Supervision/CGA  general mobility, dual-cog, safety awareness    Communication                Safety/Cognition/ Behavioral Observations  mod A for cogntiive task of  increasing complexity- attention, processing, problem solving,/ reasoning, impulsive responses, initiation. min A for functional task.   supervision for functional task   functional problem solving, error awareness, processing.    Pain   Adminstered Tlyenol 650mg  and Tramodol prn per orders   5/10 verbalized   Assess pain QS and prn, administered per med protocol with follow up assessment    Skin   Skin intact   No new onset of skin breakdown  Continue to assess her skin QS and PRN      Discharge Planning:  Working with [facility] to se eif can offer a bed-awaiting the financial approval. [name] SSD is still pending. Have still not heard back from [facility] regarding if provides coverage still or not since approved for Long term Medicaid   Team Discussion: Patient post hypoxic brain injury. Patient continues to demonstrate slow improvement in function and is limited by selective attention, problem solving deficits, poor carry over of strategies which impairs her ability to manage dual cognitive tasks. Note urge incontinence; MD added Myrbetriq which was ineffective and discontinued.   Patient on target to meet rehab goals: Currently needs CGA - supervision overall. Needs mod assist for cognitive tasks, attention, processing and management of impulsive responses, safety awareness, and management of dual tasks.  *See Care Plan and progress notes for long and short-term goals.   Revisions to Treatment Plan:  Working on strengthening and self care  independence activities for meal prep, medications, etc.   Teaching Needs: Safety, medications, skin care, transfers, toileting, etc.   Current Barriers to Discharge: Home enviroment access/layout, Lack of/limited family support, and Insurance for SNF coverage  Possible Resolutions to Barriers: Awaiting long term disability for funding of SNF placement  [facility] assessment for placement with LOG     Medical Summary Current Status: acne,  urinary retention, low back pain  Barriers to Discharge: Medical stability  Barriers to Discharge Comments: acne, urinary retention, low back pain Possible Resolutions to [employer] Focus: provided dietary education, continue myrbetriq, continue lidocaine patch   Continued Need for Acute Rehabilitation Level of Care: The patient requires daily medical management by a physician with specialized training in physical medicine and rehabilitation for the following reasons: Direction of a multidisciplinary physical rehabilitation program to maximize functional independence : Yes Medical management of patient stability for increased activity during participation in an intensive rehabilitation regime.: Yes Analysis of laboratory values and/or radiology reports with any subsequent need for medication adjustment and/or medical intervention. : Yes   I attest that I was present, lead the team conference, and concur with the assessment and plan of the team.   Chana Bode B 07/11/2023, 2:06 PM

## 2023-07-11 NOTE — Progress Notes (Signed)
 Physical Therapy Session Note  Patient Details  Name: Nicole [name] MRN: [id number] Date of Birth: 1975/09/10  Today's Date: 07/11/2023 PT Individual Time: 4098-1191 PT Individual Time Calculation (min): 30 min   Short Term Goals: Week 1:  PT Short Term Goal 1 (Week 1): Pt will perform supine<>sit with supervision PT Short Term Goal 1 - Progress (Week 1): Other (comment) (new goal) PT Short Term Goal 2 (Week 1): Pt will ambulate at least 153ft with no more than min A, not using an AD. PT Short Term Goal 2 - Progress (Week 1): Other (comment) (new goal) PT Short Term Goal 3 (Week 1): Pt will demonstrate decreased fall risk as noted by an improvement of at least 7 points on the [employer] Test PT Short Term Goal 3 - Progress (Week 1): Other (comment) (new goal) PT Short Term Goal 4 (Week 1): Pt will navigate 4 steps using HRs with no more than CGA  Skilled Therapeutic Interventions/Progress Updates:     Direct handoff from nsg while pt washing hands at sink with SBA and RW. Pt agreeable to therapy and no c/o pain at this time.   Pt finished washing hands and brushing teeth and hair with SBA, walked to bed and sat to don socks and shoes with SBA, verbal cues for sequencing of socks.   Pt ambulates to dayroom with largely SBA and verbal cues for decreased gait speed, turning, and moving RW at steady pace. Pt had near LOB requiring CGA when exiting room attempting to side step between door frame and folding "wet floor" sign, RW legs caught on sign. Encouraged pt to take her time and discussing either moving sign or side stepping and keeping RW near her/checking to see if legs are caught before continuing to move it.   In dayroom pt stood with SBA while playing Jenga to practice dynamic standing balance, UE coordination and fine motor skills, problem solving, and appropriate speed of movement for fine motor movements. Pt required max verbal cuing for speed of movement, stacking blocks  appropriately, choice of block to remove from tower to avoid tower collapse, and other problem solving skills. Pt able to identify if block removal would cause tower collapse with prompting but proceeded to remove block anyways on multiple occasions.   Pt ambulated back to room with extra lap around nsg with PT in front of pt for visual cue with RW sequencing/speed. Pt bumped into PT and SPT several times but overall had improved RW sequencing with external cues.  Pt completed bed mobility with supervision and prompting for boosting in bed as well as moving items in bed before laying down. Throughout session, pt required prompting and verbal cues for problem solving and demonstrated poor carry-over. Pt left supine in bed with all needs in reach and alarm on.   Therapy Documentation Precautions:  Precautions Precautions: Fall Precaution/Restrictions Comments: decreased stand balance- reliant on UE, Pain in LLE limits mobility at times Required Braces or Orthoses: Other Brace Other Brace: B adjustable night splints; B elbow "cosey" splints; L palm protector; R mitt Restrictions Weight Bearing Restrictions Per Provider Order: No General:     Therapy/Group: Individual Therapy  Collins Scotland 07/11/2023, 10:13 AM

## 2023-07-11 NOTE — Progress Notes (Signed)
 Speech Language Pathology Daily Session Note  Patient Details  Name: TOSHI ISHII MRN: [id number] Date of Birth: [birth year]  Today's Date: 07/11/2023 SLP Individual Time: 1102-1200 SLP Individual Time Calculation (min): 58 min  Short Term Goals: Week 9: SLP Short Term Goal 1 (Week 9): Week 33 - Pt will solve mildly complex environmental problems with 85% accuracy given min assist. SLP Short Term Goal 2 (Week 9): Week 33 - Pt will demonstrate emergent awareness of mistakes during functional environmental tasks and correct mistakes in 5/6 opportunities given min assist.  Skilled Therapeutic Interventions: Skilled therapy session focused on cognitive goals. SLP facilitated session by providing minA for patient to locate destinations around rehabilitation unit using signs. Patient occasionally required re-direction to task and cues to "slow down." SLP targeted problem solving and emergent awareness goals through abstract board game task. Patient required minA to utilize planning strategies before making decisions. At the end of the session, patient recalled destinations on scavenger hunt as well as strategies discussed in tx with minA. Patient left in chair with alarm set and call bell in reach. Continue POC.   Pain None reported  Therapy/Group: Individual Therapy  Jaicey Sweaney M.A., CF-SLP 07/11/2023, 7:45 AM

## 2023-07-11 NOTE — Progress Notes (Signed)
 PROGRESS NOTE   Subjective/Complaints: Team conference today Seen ambulating with RW, has no new complaints Provided list of foods for acne  ROS: Denies CP, SOB, abd pain, N/V/D/C, or any other complaints at this time.  +low back pain- variable, but managable, +muscle spasms, +urinary incontinence/retention +acne    Objective:   No results found.    No results for input(s): "WBC", "HGB", "HCT", "PLT" in the last 72 hours.          No results for input(s): "NA", "K", "CL", "CO2", "GLUCOSE", "BUN", "CREATININE", "CALCIUM" in the last 72 hours.           Intake/Output Summary (Last 24 hours) at 07/11/2023 1105 Last data filed at 07/11/2023 0724 Gross per 24 hour  Intake 694 ml  Output --  Net 694 ml           Physical Exam: Vital Signs Blood pressure 103/64, pulse 74, temperature 98 F (36.7 C), temperature source Oral, resp. rate 17, height 5\' 1"  (1.549 m), weight 62.4 kg, SpO2 98%.  Constitutional: No distress . Vital signs reviewed. Resting in bed, comfortable HEENT: NCAT, EOMI, oral membranes moist Neck: supple Cardiovascular: RRR without murmur. No JVD    Respiratory/Chest: CTA Bilaterally without wheezes or rales. Normal effort GI/Abdomen: BS +, non-tender, non-distended, soft Ext: no clubbing, cyanosis, or edema Psych: pleasant and cooperative   Skin: + Scaling dried skin on bilateral heels-covered, +acne Neurologic: alert, oriented to person, place. Fair insight Phonation and speech intelligability normal. STM deficits but improving. Minimal hypertonicity RUE and RLE. Hypersensitivity to left hand improved, Left sided strength improved, decreased range of motion and pain at end range of motion in left shoulder, stable 2/26  Musculoskeletal:some limitations in right shoulder ROM, LB TTP     Assessment/Plan: 1. Functional deficits which require 3+ hours per day of interdisciplinary  therapy in a comprehensive inpatient rehab setting. Physiatrist is providing close team supervision and 24 hour management of active medical problems listed below. Physiatrist and rehab team continue to assess barriers to discharge/monitor patient progress toward functional and medical goals  Care Tool:  Bathing    Body parts bathed by patient: Face, Abdomen, Chest, Left upper leg, Right upper leg, Left arm, Front perineal area, Right arm, Right lower leg, Left lower leg, Buttocks   Body parts bathed by helper: Buttocks Body parts n/a: Right arm, Left arm, Front perineal area, Buttocks, Right upper leg, Left upper leg, Right lower leg, Left lower leg, Chest, Abdomen, Face   Bathing assist Assist Level: Supervision/Verbal cueing     Upper Body Dressing/Undressing Upper body dressing   What is the patient wearing?: Bra, Pull over shirt    Upper body assist Assist Level: Contact Guard/Touching assist    Lower Body Dressing/Undressing Lower body dressing      What is the patient wearing?: Underwear/pull up, Pants     Lower body assist Assist for lower body dressing: Supervision/Verbal cueing     Toileting Toileting Toileting Activity did not occur (Clothing management and hygiene only): N/A (no void or bm)  Toileting assist Assist for toileting: Supervision/Verbal cueing     Transfers Chair/bed transfer  Transfers assist  Chair/bed transfer  activity did not occur: Safety/medical concerns  Chair/bed transfer assist level: Supervision/Verbal cueing Chair/bed transfer assistive device: Armrests, Geologist, engineering   Ambulation assist   Ambulation activity did not occur: Safety/medical concerns  Assist level: Contact Guard/Touching assist Assistive device: Walker-rolling Max distance: 500'   Walk 10 feet activity   Assist  Walk 10 feet activity did not occur: Safety/medical concerns  Assist level: Supervision/Verbal cueing Assistive device:  Walker-rolling   Walk 50 feet activity   Assist Walk 50 feet with 2 turns activity did not occur: Safety/medical concerns  Assist level: Contact Guard/Touching assist Assistive device: Walker-rolling    Walk 150 feet activity   Assist Walk 150 feet activity did not occur: Safety/medical concerns  Assist level: Contact Guard/Touching assist Assistive device: Walker-rolling    Walk 10 feet on uneven surface  activity   Assist Walk 10 feet on uneven surfaces activity did not occur: Safety/medical concerns   Assist level: Contact Guard/Touching assist Assistive device: Walker-rolling   Wheelchair     Assist Is the patient using a wheelchair?: Yes Type of Wheelchair: Manual    Wheelchair assist level: Supervision/Verbal cueing Max wheelchair distance: 137ft    Wheelchair 50 feet with 2 turns activity    Assist    Wheelchair 50 feet with 2 turns activity did not occur: Safety/medical concerns   Assist Level: Supervision/Verbal cueing   Wheelchair 150 feet activity     Assist  Wheelchair 150 feet activity did not occur: Safety/medical concerns   Assist Level: Supervision/Verbal cueing   Blood pressure 103/64, pulse 74, temperature 98 F (36.7 C), temperature source Oral, resp. rate 17, height 5\' 1"  (1.549 m), weight 62.4 kg, SpO2 98%.  Medical Problem List and Plan: 1. 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 Awaiting approval of disability. SW has continued to work on placement. We also have engaged leadership within hospital for options. Code status changed  to full code after obtaining patient consent -therapies are daily only SW note reviewed and we do not have any accepting facilities thus far Discussed that we are waiting to hear from disability Continue CIR  2.  Impaired mobility: d/c lovenox since ambulating >150 feet, d/c SCDs as she does not like these  3. Diffuse pains: resolved, gabapentin d/ced, lidocaine patches for back pain, continue  4. History of anxiety: discussed with her mom that she feels this was a big risk factor for her accident, propanolol d/ced due to hypotension. Asked SW to place her on neuropsych list, Buspar ordered prn, continue this. Discussed placing outpatient referral for behavioral health f/u, discussed that Buspar has been helpful for her. Decreased buspar to daily prn, recreational therapy consulted  5. Impulsive, impaired cognition: renewed safety restraints. This patient is not capable of making decisions on her own behalf, messaged nursing to see if they feel she still needs restraints  6. Skin: eucerin cream BID -Buttock/sacrum--foam dressing, pressure relief, nutrition -stage 3 left heel, resolved  7. Fluids/Electrolytes/Nutrition: Routine in and outs with follow-up chemistries  -Eating  well with cueing   Upgraded to regular diet  -Vit D 4000U daily  8.  Cavitary right lower lobe pneumonia likely aspiration pneumonia/MRSA pneumonia.  Resolved; had xopenex inhaler in room, expired, reordered 2/22 for PRN use    Latest Ref Rng & Units 05/30/2023    5:25 AM 05/16/2023    6:28 AM 05/02/2023    5:20 AM  CBC  WBC 4.0 - 10.5 K/uL 5.3  6.2  5.9   Hemoglobin 12.0 - 15.0 g/dL 52.8  41.3  24.4   Hematocrit 36.0 - 46.0 % 41.5  42.1  43.0   Platelets 150 - 400 K/uL 223  300  241      9.  History of drug abuse.  Positive cocaine on urine drug screen.  Provide counseling  10.  AKI/hypovolemia and ATN.  Resolved -BUN/creatinine stable 1/20  -Potassium 3.9 1/20  -07/08/23 no labs in over a month, will  order labs for tomorrow    Latest Ref Rng & Units 05/30/2023    5:25 AM 05/16/2023    6:28 AM 05/02/2023    5:20 AM  BMP  Glucose 70 - 99 mg/dL 87  87  89   BUN 6 - 20 mg/dL 10  15  14    Creatinine 0.44 - 1.00 mg/dL 0.10  2.72  5.36   Sodium 135 - 145 mmol/L 137  136  138   Potassium 3.5 - 5.1 mmol/L 3.9  3.9  4.0   Chloride 98 - 111 mmol/L 107  105  109   CO2 22 - 32 mmol/L 25  22  23    Calcium 8.9 - 10.3 mg/dL 8.6  8.9  8.9     11.  Mild transaminitis with rhabdomyolysis.  Both resolved  12.  Hypotension:  D/c flomax, continue to monitor BP TID -2/22-23/25 BP fine/stable, cont monitoring  Vitals:   07/07/23 2140 07/07/23 2141 07/08/23 0543 07/08/23 1631  BP: (!) 89/60 (!) 93/55 97/74 107/72   07/08/23 2009 07/09/23 0453 07/09/23 1432 07/09/23 2036  BP: 124/81 103/74 100/83 101/61   07/10/23 0500 07/10/23 1301 07/10/23 2016 07/11/23 0622  BP: 100/70 96/62 99/61  103/64     13. HTN: resolved  14. Impaired initiation: resolved, d/c amantadine  -Consider restart amantadine-- restarted 100mg  daily-- d/c'd  15. Hyperglycemia: CBGs mildly elevated, d/ced checks. Prosource d/ced as per patient's request  16. Spasticity:   improved  17.  Dyskinetic movements in head and neck, discussed with team that these seem to restart when baclofen is reastarted  18. Cervical extensor weakness: improved, continue strengthening exercises  19. Bowel and bladder incontinence: continue bowel and bladder program. Flomax started for urgency-- d/c'd -06/24/23 pt frustrated with incontinence/how long it takes her to try to urinate; discussed that she's had a variety of meds tried with no significant difference; continue trying timed toileting, but wonder if outpatient urology f/up for urodynamic studies would be beneficial? -07/08/23 appears she was started on myrbetriq 25mg  daily (see below)  20. Fatigue: resolved, d/c b complex   21. MRSA nares positive: negative on repeat, precautions d/ced  22.  Tachycardic: resolved  23. S/[name] fall: CT head reviewed and is stable  24. Bilateral foot drop: improved  25. Vaginal itching: resolved  26. Dysuria resolved  27. Poor sitting balance: continue OT/PT, improving  28. Headaches: topamax 34m daily prn ordered, will d/c since not requiring  29. Drowsiness with tramadol: d/ced  30. IUD in place: Pt states she has IUD x36yrs  and believes it's supposed to be removed soon(chart review- Dr. Seymour Bars placed Mirena IUD on 07/01/19, good for  36yrs); needs outpatient follow-up with OB/GYN  31. Constipation: d/c baclofen, last BM 12/18, continue magnesium gluconate HS  32. History of drug addiction: tramadol d/ced as per mom's preference  33. Depression: discussed scheduling for outpatient psychology/psychiatry follow-up, discussed neuropsych f/u while in hospital  -appreciate Dr. Marvetta Gibbons follow up  34. Sinusitis: claritin ordered prn, will d/c since not requiring  35. Pruritus: sarna lotion ordered, discussed that this is prn, will d/c since not requiring  26. Low back pain: XR ordered and discussed that it is negative, continue lidocaine patch, tramadol added prn, advised that she will have to ask for medication if she feels that she needs it, d/c baclofen given hypotension  27. Polypharmacy: amantadine d/ced since no changes noted with it, continue current medications  28. Insomnia: change melatonin to prn, d/c baclofen given hypotension, continue melatonin prn  29. Muscle spasms: d/c baclofen given hypotension  30. Urinary retention: continue Myrbetriq 25mg  daily, discussed that it can take several weeks to work  31. Acne: will provide dietary education  LOS: 235 days A FACE TO FACE EVALUATION WAS PERFORMED  Nicole [name] Nicole [name] 07/11/2023, 11:05 AM

## 2023-07-11 NOTE — Progress Notes (Signed)
 Patient ID: Nicole [name], female   DOB: 06/11/1975, 48 y.o.   MRN: [id number]  Continue to wait for finance to make a decision regarding coverage at [facility] for pt. Will reach out to Wandra Mannan to see how long this process takes.

## 2023-07-11 NOTE — Plan of Care (Signed)
  Problem: Consults Goal: RH STROKE PATIENT EDUCATION Description: See Patient Education module for education specifics  Outcome: Progressing   Problem: RH BOWEL ELIMINATION Goal: RH STG MANAGE BOWEL WITH ASSISTANCE Description: STG Manage Bowel with mod I Assistance. Outcome: Progressing Goal: RH STG MANAGE BOWEL W/MEDICATION W/ASSISTANCE Description: STG Manage Bowel with Medication with mod I Assistance. Outcome: Progressing   Problem: RH BLADDER ELIMINATION Goal: RH STG MANAGE BLADDER WITH ASSISTANCE Description: STG Manage Bladder With toileting Assistance Outcome: Progressing   Problem: RH SKIN INTEGRITY Goal: RH STG MAINTAIN SKIN INTEGRITY WITH ASSISTANCE Description: STG Maintain Skin Integrity With min  Assistance. Outcome: Progressing

## 2023-07-12 NOTE — Progress Notes (Signed)
 Speech Language Pathology Daily Session Note  Patient Details  Name: Nicole [name] MRN: [id number] Date of Birth: 13-Dec-1975  Today's Date: 07/12/2023 SLP Individual Time: 1130-1200 SLP Individual Time Calculation (min): 30 min  Short Term Goals: Week 9: SLP Short Term Goal 1 (Week 9): Week 33 - Pt will solve mildly complex environmental problems with 85% accuracy given min assist. SLP Short Term Goal 2 (Week 9): Week 33 - Pt will demonstrate emergent awareness of mistakes during functional environmental tasks and correct mistakes in 5/6 opportunities given min assist.  Skilled Therapeutic Interventions:   Pt greeted in her room. She was awake/alert in her wheelchair upon SLP arrival. She independently ambulated to the ST office via rolling walker, only requiring CGA to position the chair for sitting. She completed a functional money management task comparing amounts w/ minA cues for information processing, calculation, and attention to detail. SLP also assisted pt with organizing her notebook, including goal setting for the the year 2025. She benefited from minA to ID written and sustained attention errors. Once back in her room, she benefited from additional processing time and minA visual/tactile cues for problem solving to ensure desired position of her wheelchair for noon meal. She was left with the chair alarm on and call light within reach. Recommend cont ST.   Pain  Reported mild headache. [name] nurse provided pain medication upon request.   Therapy/Group: Individual Therapy  [name] 07/12/2023, 7:19 AM

## 2023-07-12 NOTE — Progress Notes (Signed)
 Patient ID: Nicole [name], female   DOB: Feb 02, 1976, 48 y.o.   MRN: [id number]  Reached out to Wandra Mannan regarding update with Sonny Dandy he reports working out Education officer, community. He will get back with this worker once has more information

## 2023-07-12 NOTE — Progress Notes (Signed)
 Physical Therapy Session Note  Patient Details  Name: Nicole [name] MRN: [id number] Date of Birth: 1975/10/31  Today's Date: 07/12/2023 PT Individual Time: 1400-1430 PT Individual Time Calculation (min): 30 min   Short Term Goals: Week 1:  PT Short Term Goal 1 (Week 1): Pt will perform supine<>sit with supervision PT Short Term Goal 1 - Progress (Week 1): Other (comment) (new goal) PT Short Term Goal 2 (Week 1): Pt will ambulate at least 138ft with no more than min A, not using an AD. PT Short Term Goal 2 - Progress (Week 1): Other (comment) (new goal) PT Short Term Goal 3 (Week 1): Pt will demonstrate decreased fall risk as noted by an improvement of at least 7 points on the [employer] Test PT Short Term Goal 3 - Progress (Week 1): Other (comment) (new goal) PT Short Term Goal 4 (Week 1): Pt will navigate 4 steps using HRs with no more than CGA  Skilled Therapeutic Interventions/Progress Updates:    Handoff from NT in the bathroom. Pt changing pants, but max cues for safety. Pt more impulsive this afternoon in the session despite max cues and decreased attention overall. Pt moving around room with CGA without device, 1 episode of min assist due to mistep on the transition out of the bathroom. Pt reaching down to the lower drawer of dresser as she wanted new pants, min assist for safety. Difficulty with sequencing of where to sit and how to don pants, extra time needed for processing and pt verbalizing decreased patience with herself this afternoon. Pt goes to closet to get a coat "for going outside" despite telling her we wouldn't have time this afternoon due to her next appt, extra time for donning of jacket and ultimately needed assist to complete. Gait to therapy gym with CGA without device x 150'. On Kinetron in standing focused on attention, patience, and functional dynamic standing balance during task with instructions to "wait for instructions from therapist" but pt unable to slow down  enough to follow directions at all during this time mulitple times repeated back and made her stop - could not control the impulse. Pt completed the task with min assist for standing balance but max for cognition.   Transitioned her to w/c for Dance Group.   Therapy Documentation Precautions:  Precautions Precautions: Fall Precaution/Restrictions Comments: decreased stand balance- reliant on UE, Pain in LLE limits mobility at times Restrictions Weight Bearing Restrictions Per Provider Order: No   Pain: Reports having a migraine today - unrated and reports having tylenol already. Agreeable to session .    Therapy/Group: Individual Therapy  Karolee Stamps Darrol Poke, PT, DPT, CBIS  07/12/2023, 2:49 PM

## 2023-07-12 NOTE — Plan of Care (Signed)
  Problem: Consults Goal: RH STROKE PATIENT EDUCATION Description: See Patient Education module for education specifics  Outcome: Progressing   Problem: RH BOWEL ELIMINATION Goal: RH STG MANAGE BOWEL WITH ASSISTANCE Description: STG Manage Bowel with mod I Assistance. Outcome: Progressing Goal: RH STG MANAGE BOWEL W/MEDICATION W/ASSISTANCE Description: STG Manage Bowel with Medication with mod I Assistance. Outcome: Progressing   Problem: RH BLADDER ELIMINATION Goal: RH STG MANAGE BLADDER WITH ASSISTANCE Description: STG Manage Bladder With toileting Assistance Outcome: Progressing   Problem: RH SKIN INTEGRITY Goal: RH STG MAINTAIN SKIN INTEGRITY WITH ASSISTANCE Description: STG Maintain Skin Integrity With min  Assistance. Outcome: Progressing

## 2023-07-12 NOTE — Progress Notes (Signed)
 Occupational Therapy Session Note  Patient Details  Name: Nicole [name] MRN: [id number] Date of Birth: 07-14-1975  Today's Date: 07/12/2023 OT Individual Time: 4098-1191 OT Individual Time Calculation (min): 30 min    Short Term Goals: See POC for STG  Skilled Therapeutic Interventions/Progress Updates:     Pt received sitting up in wc, dressed and ready for the day presenting to be in good spirits receptive to skilled OT session reporting mild pain in back with pain patch in place- OT offering intermittent rest breaks, repositioning, and therapeutic support to optimize participation in therapy session. Offered to assist Pt to bathroom to adhere to timed toileting schedule. Pt receptive to visiting bathroom, however reporting she has already voided in her brief and was "disgusted" by this. Provided education on use of call bell to receive assistance from nursing and calling for nursing staff ahead of time to avoid having an accident- Pt receptive to education, reporting she had called for nursing staff, however spoke with nursing staff following session with nurse secretary and nurse tech reporting Pt had not called for assistance. Pt able to demonstrate teach back of call bell use during session. Pt completed functional mobility to bathroom using RW close supervision. Engaged Pt in completing toileting and LB bathing tasks with focus on problem solving and sustained attention. Pt required mod verbal cues for sequencing, task initiation, and problem solving during BADLs. Pt frequently looking to therapist to wait for next cue with OT primarily providing questioning cues of "what should you do next?" To facilitate increased problem solving and independence. Pt demonstrated challenges with weaving feet into tight legging pants d/t her feet getting caught with education provided on modified technique of rolling up pant leg and then weaving feet with legs crossed into figure-four position- OT provided  assistance with weaving L LE to support learning with Pt then able to weave R LE demonstrating teach back as evidence of learning. Pt continues to demonstrate functional cognition (attention, sequencing, and task initiation) deficits impacting her independence in BADLs. Pt ambulated back to her room using RW and stood to wash hands close supervision. Pt was left resting in wc with call bell in reach, chair alarm on, and all needs met.    Therapy Documentation Precautions:  Precautions Precautions: Fall Precaution/Restrictions Comments: decreased stand balance- reliant on UE, Pain in LLE limits mobility at times Required Braces or Orthoses: Other Brace Other Brace: B adjustable night splints; B elbow "cosey" splints; L palm protector; R mitt Restrictions Weight Bearing Restrictions Per Provider Order: No  Therapy/Group: Individual Therapy  Nicole [name] 07/12/2023, 11:16 AM

## 2023-07-12 NOTE — Progress Notes (Signed)
 Occupational Therapy Session Note  Patient Details  Name: Nicole [name] MRN: [id number] Date of Birth: 25-Dec-1975  {CHL IP REHAB OT TIME CALCULATIONS:[id number]}   Short Term Goals: OT Short Term Goal 1 : Week 21: Pt will ambulate to BR with RW with min A with min cues for safety OT Short Term Goal 2: Week 21 Patient will demonstrate safe sitting balance while completing familiar BADL task with min cueing- progressing OT Short Term Goal 3: Week 21Pt will perform a groom task standing at the sink with min A for 1 min with min cues OT Short Term Goal 4: Week 21 Pt will bend down to retrieve items in drawer in prep for ADL from standing position with min A with RW   Skilled Therapeutic Interventions/Progress Updates:    Patient agreeable to participate in OT session. Reports *** pain level.   Patient participated in skilled OT session focusing on ***. Therapist facilitated/assessed/developed/educated/integrated/elicited *** in order to improve/facilitate/promote    Therapy Documentation Precautions:  Precautions Precautions: Fall Precaution/Restrictions Comments: decreased stand balance- reliant on UE, Pain in LLE limits mobility at times Required Braces or Orthoses: Other Brace Other Brace: B adjustable night splints; B elbow "cosey" splints; L palm protector; R mitt Restrictions Weight Bearing Restrictions Per Provider Order: No  Therapy/Group: Individual Therapy  Limmie Patricia, OTR/L,CBIS  Supplemental OT - [facility] and [facility] Secure Chat Preferred   07/12/2023, 10:09 PM

## 2023-07-12 NOTE — Progress Notes (Signed)
 PROGRESS NOTE   Subjective/Complaints: No new complaints this morning On commode Tolerated OT week No issues overnight  ROS: Denies CP, SOB, abd pain, N/V/D/C, or any other complaints at this time.  +low back pain- variable, but managable, +muscle spasms, +urinary incontinence/retention- unresponsive to medication +acne    Objective:   No results found.    No results for input(s): "WBC", "HGB", "HCT", "PLT" in the last 72 hours.          No results for input(s): "NA", "K", "CL", "CO2", "GLUCOSE", "BUN", "CREATININE", "CALCIUM" in the last 72 hours.          No intake or output data in the 24 hours ending 07/12/23 1138          Physical Exam: Vital Signs Blood pressure 101/67, pulse 71, temperature 98.3 F (36.8 C), resp. rate 16, height 5\' 1"  (1.549 m), weight 62.4 kg, SpO2 99%.  Constitutional: No distress . Vital signs reviewed. Resting in bed, comfortable HEENT: NCAT, EOMI, oral membranes moist Neck: supple Cardiovascular: RRR without murmur. No JVD    Respiratory/Chest: CTA Bilaterally without wheezes or rales. Normal effort GI/Abdomen: BS +, non-tender, non-distended, soft Ext: no clubbing, cyanosis, or edema Psych: pleasant and cooperative   Skin: + Scaling dried skin on bilateral heels-covered, +acne Neurologic: alert, oriented to person, place. Fair insight Phonation and speech intelligability normal. STM deficits but improving. Minimal hypertonicity RUE and RLE. Hypersensitivity to left hand improved, Left sided strength improved, decreased range of motion and pain at end range of motion in left shoulder, stable 2/27  Musculoskeletal:some limitations in right shoulder ROM, LB TTP     Assessment/Plan: 1. Functional deficits which require 3+ hours per day of interdisciplinary therapy in a comprehensive inpatient rehab setting. Physiatrist is providing close team supervision and 24  hour management of active medical problems listed below. Physiatrist and rehab team continue to assess barriers to discharge/monitor patient progress toward functional and medical goals  Care Tool:  Bathing    Body parts bathed by patient: Face, Abdomen, Chest, Left upper leg, Right upper leg, Left arm, Front perineal area, Right arm, Right lower leg, Left lower leg, Buttocks   Body parts bathed by helper: Buttocks Body parts n/a: Right arm, Left arm, Front perineal area, Buttocks, Right upper leg, Left upper leg, Right lower leg, Left lower leg, Chest, Abdomen, Face   Bathing assist Assist Level: Supervision/Verbal cueing     Upper Body Dressing/Undressing Upper body dressing   What is the patient wearing?: Bra, Pull over shirt    Upper body assist Assist Level: Contact Guard/Touching assist    Lower Body Dressing/Undressing Lower body dressing      What is the patient wearing?: Underwear/pull up, Pants     Lower body assist Assist for lower body dressing: Supervision/Verbal cueing     Toileting Toileting Toileting Activity did not occur (Clothing management and hygiene only): N/A (no void or bm)  Toileting assist Assist for toileting: Supervision/Verbal cueing     Transfers Chair/bed transfer  Transfers assist  Chair/bed transfer activity did not occur: Safety/medical concerns  Chair/bed transfer assist level: Supervision/Verbal cueing Chair/bed transfer assistive device: Armrests, Geologist, engineering  Ambulation assist   Ambulation activity did not occur: Safety/medical concerns  Assist level: Contact Guard/Touching assist Assistive device: Walker-rolling Max distance: 500'   Walk 10 feet activity   Assist  Walk 10 feet activity did not occur: Safety/medical concerns  Assist level: Supervision/Verbal cueing Assistive device: Walker-rolling   Walk 50 feet activity   Assist Walk 50 feet with 2 turns activity did not occur:  Safety/medical concerns  Assist level: Contact Guard/Touching assist Assistive device: Walker-rolling    Walk 150 feet activity   Assist Walk 150 feet activity did not occur: Safety/medical concerns  Assist level: Contact Guard/Touching assist Assistive device: Walker-rolling    Walk 10 feet on uneven surface  activity   Assist Walk 10 feet on uneven surfaces activity did not occur: Safety/medical concerns   Assist level: Contact Guard/Touching assist Assistive device: Walker-rolling   Wheelchair     Assist Is the patient using a wheelchair?: Yes Type of Wheelchair: Manual    Wheelchair assist level: Supervision/Verbal cueing Max wheelchair distance: 159ft    Wheelchair 50 feet with 2 turns activity    Assist    Wheelchair 50 feet with 2 turns activity did not occur: Safety/medical concerns   Assist Level: Supervision/Verbal cueing   Wheelchair 150 feet activity     Assist  Wheelchair 150 feet activity did not occur: Safety/medical concerns   Assist Level: Supervision/Verbal cueing   Blood pressure 101/67, pulse 71, temperature 98.3 F (36.8 C), resp. rate 16, height 5\' 1"  (1.549 m), weight 62.4 kg, SpO2 99%.  Medical Problem List and Plan: 1. 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 Awaiting approval of disability. SW has continued to work on placement. We also have engaged leadership within hospital for options. Code status changed to full code after obtaining patient consent -therapies are daily only SW note reviewed and we do not have any accepting  facilities thus far Discussed that we are waiting to hear from disability Continue CIR  2.  Impaired mobility: d/c lovenox since ambulating >150 feet, d/c SCDs as she does not like these  3. Diffuse pains: resolved, gabapentin d/ced, lidocaine patches for back pain, continue  4. History of anxiety: discussed with her mom that she feels this was a big risk factor for her accident, propanolol d/ced due to hypotension. Asked SW to place her on neuropsych list, Buspar ordered prn, continue this. Discussed placing outpatient referral for behavioral health f/u, discussed that Buspar has been helpful for her. Decreased buspar to daily prn, recreational therapy consulted  5. Impulsive, impaired cognition: renewed safety restraints. This patient is not capable of making decisions on her own behalf, messaged nursing to see if they feel she still needs restraints  6. Skin: eucerin cream BID -Buttock/sacrum--foam dressing, pressure relief, nutrition -stage 3 left heel, resolved  7. Fluids/Electrolytes/Nutrition: Routine in and outs with follow-up chemistries  -Eating  well with cueing   Upgraded to regular diet  -Vit D 4000U daily      8.  Cavitary right lower lobe pneumonia likely aspiration pneumonia/MRSA pneumonia.  Resolved; had xopenex inhaler in room, expired, reordered 2/22 for PRN use    Latest  Ref Rng & Units 05/30/2023    5:25 AM 05/16/2023    6:28 AM 05/02/2023    5:20 AM  CBC  WBC 4.0 - 10.5 K/uL 5.3  6.2  5.9   Hemoglobin 12.0 - 15.0 g/dL 78.2  95.6  21.3   Hematocrit 36.0 - 46.0 % 41.5  42.1  43.0   Platelets 150 - 400 K/uL 223  300  241      9.  History of drug abuse.  Positive cocaine on urine drug screen.  Provide counseling  10.  AKI/hypovolemia and ATN.  Resolved -BUN/creatinine stable 1/20  -Potassium 3.9 1/20  -07/08/23 no labs in over a month, will order labs for tomorrow    Latest Ref Rng & Units 05/30/2023    5:25 AM 05/16/2023    6:28 AM 05/02/2023    5:20 AM  BMP   Glucose 70 - 99 mg/dL 87  87  89   BUN 6 - 20 mg/dL 10  15  14    Creatinine 0.44 - 1.00 mg/dL 0.86  5.78  4.69   Sodium 135 - 145 mmol/L 137  136  138   Potassium 3.5 - 5.1 mmol/L 3.9  3.9  4.0   Chloride 98 - 111 mmol/L 107  105  109   CO2 22 - 32 mmol/L 25  22  23    Calcium 8.9 - 10.3 mg/dL 8.6  8.9  8.9     11.  Mild transaminitis with rhabdomyolysis.  Both resolved  12.  Hypotension:  D/c flomax, continue to monitor BP TID -2/22-23/25 BP fine/stable, cont monitoring  Vitals:   07/08/23 1631 07/08/23 2009 07/09/23 0453 07/09/23 1432  BP: 107/72 124/81 103/74 100/83   07/09/23 2036 07/10/23 0500 07/10/23 1301 07/10/23 2016  BP: 101/61 100/70 96/62 99/61    07/11/23 0622 07/11/23 1434 07/11/23 1928 07/12/23 0500  BP: 103/64 (P) 102/73 101/67 101/67     13. HTN: resolved  14. Impaired initiation: resolved, d/c amantadine  -Consider restart amantadine-- restarted 100mg  daily-- d/c'd  15. Hyperglycemia: CBGs mildly elevated, d/ced checks. Prosource d/ced as per patient's request  16. Spasticity:   improved  17.  Dyskinetic movements in head and neck, discussed with team that these seem to restart when baclofen is reastarted  18. Cervical extensor weakness: improved, continue strengthening exercises  19. Bowel and bladder incontinence: continue bowel and bladder program. Flomax started for urgency-- d/c'd -06/24/23 pt frustrated with incontinence/how long it takes her to try to urinate; discussed that she's had a variety of meds tried with no significant difference; continue trying timed toileting, but wonder if outpatient urology f/up for urodynamic studies would be beneficial? -07/08/23 appears she was started on myrbetriq 25mg  daily (see below)  20. Fatigue: resolved, d/c b complex   21. MRSA nares positive: negative on repeat, precautions d/ced  22. Tachycardic: resolved  23. S/p fall: CT head reviewed and is stable  24. Bilateral foot drop: improved  25. Vaginal  itching: resolved  26. Dysuria resolved  27. Poor sitting balance: continue OT/PT, improving  28. Headaches: topamax 4m daily prn ordered, will d/c since not requiring  29. Drowsiness with tramadol: d/ced  30. IUD in place: Pt states she has IUD x35yrs  and believes it's supposed to be removed soon(chart review- Dr. Seymour Bars placed Mirena IUD on 07/01/19, good for 59yrs); needs outpatient follow-up with OB/GYN  31. Constipation: d/c baclofen, last BM 12/18, continue magnesium gluconate HS  32. History of drug addiction: tramadol d/ced as per mom's  preference  33. Depression: discussed scheduling for outpatient psychology/psychiatry follow-up, discussed neuropsych f/u while in hospital  -appreciate Dr. Marvetta Gibbons follow up  34. Sinusitis: claritin ordered prn, will d/c since not requiring  35. Pruritus: sarna lotion ordered, discussed that this is prn, will d/c since not requiring  26. Low back pain: XR ordered and discussed that it is negative, continue lidocaine patch, tramadol added prn, advised that she will have to ask for medication if she feels that she needs it, d/c baclofen given hypotension  27. Polypharmacy: amantadine d/ced since no changes noted with it, continue current medications  28. Insomnia: change melatonin to prn, d/c baclofen given hypotension, continue melatonin prn  29. Muscle spasms: d/c baclofen given hypotension  30. Urinary retention: continue Myrbetriq 25mg  daily, discussed that it can take several weeks to work  31. Acne: will provide dietary education  LOS: 236 days A FACE TO FACE EVALUATION WAS PERFORMED  Nicole [name] Nicole [name] 07/12/2023, 11:38 AM

## 2023-07-12 NOTE — Progress Notes (Incomplete)
Left ear feels better,no c/o, patient continue to refuse SCD's. Continue to monitor closely

## 2023-07-13 LAB — URINALYSIS, ROUTINE W REFLEX MICROSCOPIC
Bilirubin Urine: NEGATIVE
Glucose, UA: NEGATIVE mg/dL
Hgb urine dipstick: NEGATIVE
Ketones, ur: NEGATIVE mg/dL
Leukocytes,Ua: NEGATIVE
Nitrite: NEGATIVE
Protein, ur: NEGATIVE mg/dL
Specific Gravity, Urine: 1.005 (ref 1.005–1.030)
pH: 5 (ref 5.0–8.0)
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
## 2023-07-13 NOTE — Progress Notes (Signed)
 Physical Therapy Session Note  Patient Details  Name: KYANNA MAHRT MRN: [id number] Date of Birth: [birth year]  Today's Date: 07/13/2023 PT Individual Time: 1445-1530 PT Individual Time Calculation (min): 45 min   Short Term Goals: Week 1:  PT Short Term Goal 1 (Week 1): Pt will perform supine<>sit with supervision PT Short Term Goal 1 - Progress (Week 1): Other (comment) (new goal) PT Short Term Goal 2 (Week 1): Pt will ambulate at least 160ft with no more than min A, not using an AD. PT Short Term Goal 2 - Progress (Week 1): Other (comment) (new goal) PT Short Term Goal 3 (Week 1): Pt will demonstrate decreased fall risk as noted by an improvement of at least 7 points on the [employer] Test PT Short Term Goal 3 - Progress (Week 1): Other (comment) (new goal) PT Short Term Goal 4 (Week 1): Pt will navigate 4 steps using HRs with no more than CGA  Skilled Therapeutic Interventions/Progress Updates:    .  Direct handoff of care from nursing as patient finishing attempting to use the bathroom. Pt unable to void so deferred until later. Sit<>Stand to RW with supervision - able to raise brief and pants in standing without LOB. Ambulates with RW and supervision in CIR hallways >361ft - min cues for safety awareness. Pt requesting to "play basketball" and reports this was on her 2025 year goal list - so played basketball in rehab gym with min guard - task made therapeutic while tasked for dual-cog components for categorical naming. Pt unable to make quick adjustments for LOB while dual-cog tasking - needing minA for safely picking up the ball from ground level while standing. Returned to her room and patient ended session EOB with alarm on. Patient then impulsively standing once PT leaves the room and when returned, pt reporting she's going to the bathroom. Assisted to the bathroom and left sitting on toilet - notified nursing of patient status.   Therapy Documentation Precautions:   Precautions Precautions: Fall Precaution/Restrictions Comments: decreased stand balance- reliant on UE, Pain in LLE limits mobility at times Required Braces or Orthoses: Other Brace Other Brace: B adjustable night splints; B elbow "cosey" splints; L palm protector; R mitt Restrictions Weight Bearing Restrictions Per Provider Order: No General:    Therapy/Group: Individual Therapy  Orrin Brigham 07/13/2023, 7:46 AM

## 2023-07-13 NOTE — Progress Notes (Signed)
 Patient ID: Nicole [name], female   DOB: [birth year], 48 y.o.   MRN: [id number]  Pt had questions regarding [facility] and taking a tour. Discussed will need to make sure going there and she should be glad she hopefully she can go somewhere local so her Mom can visit her. Pt reports she would be happy to go there and will await word from this worker. Continue to await response from [facility] and [facility] regarding confirmation

## 2023-07-14 NOTE — Plan of Care (Signed)
  Problem: RH BOWEL ELIMINATION Goal: RH STG MANAGE BOWEL WITH ASSISTANCE Description: STG Manage Bowel with mod I Assistance. Outcome: Progressing   Problem: RH BLADDER ELIMINATION Goal: RH STG MANAGE BLADDER WITH ASSISTANCE Description: STG Manage Bladder With toileting Assistance Outcome: Progressing   Problem: RH SKIN INTEGRITY Goal: RH STG MAINTAIN SKIN INTEGRITY WITH ASSISTANCE Description: STG Maintain Skin Integrity With min  Assistance. Outcome: Progressing
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
## 2023-07-14 NOTE — Plan of Care (Addendum)
  Problem: Consults Goal: RH STROKE PATIENT EDUCATION Description: See Patient Education module for education specifics  07/14/2023 1939 by Tula Nakayama, LPN Outcome: Progressing Variance Psychosocial issues Impact: High 07/14/2023 1939 by Tula Nakayama, LPN Outcome: Progressing   Problem: RH BOWEL ELIMINATION Goal: RH STG MANAGE BOWEL WITH ASSISTANCE Description: STG Manage Bowel with mod I Assistance. 07/14/2023 1939 by Tula Nakayama, LPN Outcome: Not Progressing 07/14/2023 1939 by Tula Nakayama, LPN Outcome: Progressing Goal: RH STG MANAGE BOWEL W/MEDICATION W/ASSISTANCE Description: STG Manage Bowel with Medication with mod I Assistance. 07/14/2023 1939 by Tula Nakayama, LPN Outcome: Not Progressing 07/14/2023 1939 by Tula Nakayama, LPN Outcome: Progressing   Problem: RH BLADDER ELIMINATION Goal: RH STG MANAGE BLADDER WITH ASSISTANCE Description: STG Manage Bladder With toileting Assistance 07/14/2023 1939 by Tula Nakayama, LPN Outcome: Not Progressing 07/14/2023 1939 by Tula Nakayama, LPN Outcome: Progressing   Problem: RH SKIN INTEGRITY Goal: RH STG MAINTAIN SKIN INTEGRITY WITH ASSISTANCE Description: STG Maintain Skin Integrity With min  Assistance. 07/14/2023 1939 by Tula Nakayama, LPN Outcome: Progressing 07/14/2023 1939 by Tula Nakayama, LPN Outcome: Progressing

## 2023-07-14 NOTE — Plan of Care (Deleted)
  Problem: Consults Goal: RH STROKE PATIENT EDUCATION Description: See Patient Education module for education specifics  Outcome: Progressing   Problem: RH BOWEL ELIMINATION Goal: RH STG MANAGE BOWEL WITH ASSISTANCE Description: STG Manage Bowel with mod I Assistance. Outcome: Progressing Goal: RH STG MANAGE BOWEL W/MEDICATION W/ASSISTANCE Description: STG Manage Bowel with Medication with mod I Assistance. Outcome: Progressing   Problem: RH BOWEL ELIMINATION Goal: RH STG MANAGE BOWEL W/MEDICATION W/ASSISTANCE Description: STG Manage Bowel with Medication with mod I Assistance. Outcome: Progressing   Problem: RH BLADDER ELIMINATION Goal: RH STG MANAGE BLADDER WITH ASSISTANCE Description: STG Manage Bladder With toileting Assistance Outcome: Progressing   Problem: RH SKIN INTEGRITY Goal: RH STG MAINTAIN SKIN INTEGRITY WITH ASSISTANCE Description: STG Maintain Skin Integrity With min  Assistance. Outcome: Progressing

## 2023-07-15 DIAGNOSIS — R339 Retention of urine, unspecified: Secondary | ICD-10-CM

## 2023-07-15 LAB — URINE CULTURE

## 2023-07-15 MED ORDER — DOCUSATE SODIUM 100 MG PO CAPS
100.0000 mg | ORAL_CAPSULE | Freq: Every day | ORAL | Status: DC
  Administered 2023-07-15 – 2023-07-19 (×5): 100 mg via ORAL
  Filled 2023-07-15 (×5): qty 1

## 2023-07-15 NOTE — Progress Notes (Signed)
 Pt having an episode of incontinence this morning. Assisted in changing patient legs were purple mottled coloration. Does endorse some pain while standing but expresses pain is less than it has been being in the hospital. Does endorse improvement. Asking for medication for anxiety today and is restless at times.
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
## 2023-07-15 NOTE — Progress Notes (Signed)
 Restlessness this afternoon with motor tics. Pacing in room. Observed tapping sink and walker several times in a rhythmic pattern several times while assisiting patient with ADLS. Wanting to go in bed but squatting down to bed then back up and rocking self back to sit. Then would start walking around the room. Endorses feeling anxious and expressed increase in anxiety. Endorsing difficulty sleeping at night and mentioning having nightmares about "overdosing on heroin". Given PRN medication for anxiety.

## 2023-07-15 NOTE — Plan of Care (Signed)
  Problem: Consults Goal: RH STROKE PATIENT EDUCATION Description: See Patient Education module for education specifics  Outcome: Progressing   Problem: RH BOWEL ELIMINATION Goal: RH STG MANAGE BOWEL W/MEDICATION W/ASSISTANCE Description: STG Manage Bowel with Medication with mod I Assistance. Outcome: Progressing   Problem: RH BLADDER ELIMINATION Goal: RH STG MANAGE BLADDER WITH ASSISTANCE Description: STG Manage Bladder With toileting Assistance Flowsheets (Taken 07/15/2023 1801) STG: Pt will manage bladder with assistance: 4-Minimal assistance   Problem: RH SKIN INTEGRITY Goal: RH STG MAINTAIN SKIN INTEGRITY WITH ASSISTANCE Description: STG Maintain Skin Integrity With min  Assistance. Outcome: Progressing

## 2023-07-16 NOTE — Progress Notes (Signed)
 Occupational Therapy Session Note  Patient Details  Name: Nicole [name] MRN: [id number] Date of Birth: 1975-05-20  Today's Date: 07/16/2023 OT Individual Time: 1300-1345 OT Individual Time Calculation (min): 45 min    Short Term Goals: Week 2:  OT Short Term Goal 1 (Week 2): Week 21: Pt will ambulate to BR with RW with min A with min cues for safety OT Short Term Goal 1 - Progress (Week 2): Met OT Short Term Goal 2 (Week 2): Week 21 Patient will demonstrate safe sitting balance while completing familiar BADL task with min cueing- progressing OT Short Term Goal 2 - Progress (Week 2): Other (comment) OT Short Term Goal 3 (Week 2): Week 21Pt will perform a groom task standing at the sink with min A for 1 min with min cues OT Short Term Goal 3 - Progress (Week 2): Met OT Short Term Goal 4 (Week 2): WEek 21 Pt will bend down to retrieve items in drawer in prep for ADL from standing position with min A with RW OT Short Term Goal 4 - Progress (Week 2): Other (comment) OT Short Term Goal 5 (Week 2): xxx OT Short Term Goal 5 - Progress (Week 2): Other (comment)  Skilled Therapeutic Interventions/Progress Updates:      Therapy Documentation Precautions:  Precautions Precautions: Fall Precaution/Restrictions Comments: decreased stand balance- reliant on UE, Pain in LLE limits mobility at times Required Braces or Orthoses: Other Brace Other Brace: B adjustable night splints; B elbow "cosey" splints; L palm protector; R mitt Restrictions Weight Bearing Restrictions Per Provider Order: No General: "That was fun!" Pt seated in W/C upon OT arrival, agreeable to OT.  Pain:  6/10 pain reported in back, activity, intermittent rest breaks, distractions provided for pain management, pt reports tolerable to proceed.   ADL: Pt ambulating with SBA with RW from room><day room with no LOB/SOB for increased activity tolerance.  Balance Pt completed a variety of dynamic sitting/standing activities in order  to promote increased balance strategies with ADL participation. Pt completed all activities at seated and standing level with intermittent unsupported standing at RW with no LOB/SOB. Pt completed various games on Wii with VC for direction for activities. Pt with no LOB during activities, able to reach out of BOS in various planes with RUE, able to manipulate Wii controller as well.   Pt seated in W/C at end of session with W/C alarm donned, call light within reach and 4Ps assessed.    Therapy/Group: Individual Therapy  Velia Meyer, OTD, OTR/L 07/16/2023, 2:22 PM

## 2023-07-16 NOTE — Progress Notes (Signed)
 Physical Therapy Session Note  Patient Details  Name: Nicole [name] MRN: [id number] Date of Birth: 12-13-1975  Today's Date: 07/16/2023 PT Individual Time: 1448-1530 PT Individual Time Calculation (min): 42 min   Short Term Goals: Week 1:  PT Short Term Goal 1 (Week 1): Pt will perform supine<>sit with supervision PT Short Term Goal 1 - Progress (Week 1): Other (comment) (new goal) PT Short Term Goal 2 (Week 1): Pt will ambulate at least 137ft with no more than min A, not using an AD. PT Short Term Goal 2 - Progress (Week 1): Other (comment) (new goal) PT Short Term Goal 3 (Week 1): Pt will demonstrate decreased fall risk as noted by an improvement of at least 7 points on the [employer] Test PT Short Term Goal 3 - Progress (Week 1): Other (comment) (new goal) PT Short Term Goal 4 (Week 1): Pt will navigate 4 steps using HRs with no more than CGA  Skilled Therapeutic Interventions/Progress Updates:      Pt sitting in wheelchair and agreeable to therapy treatment. Has no c/o pain. Sit<>stand to RW with supervision assist. Ambulates with supervision and RW within hallways ~257ft to main rehab gym.   Worked on stair navigation with vs without hand rails - able to navigate up/down x12 steps with 2 hand rails at supervision level. Needs CGA to light minA for navigating without hand rails. Continues to require safety cues for pacing herself and monitoring body positioning to prevent falls risk.   Assisted onto mat table and positioned into supine with BLE in table top, resting on blue platform table. Completed active overhead lat stretches while holding 3lb med ball, skull-crusher tricep ext with 3lb med ball, and trunk twists with 3lb med ball. PT providing TC and VC for sequencing and technique.   Returned to her room and patient ended session sitting EOB with bed alarm on, her needs met.    Therapy Documentation Precautions:  Precautions Precautions: Fall Precaution/Restrictions Comments:  decreased stand balance- reliant on UE, Pain in LLE limits mobility at times Required Braces or Orthoses: Other Brace Other Brace: B adjustable night splints; B elbow "cosey" splints; L palm protector; R mitt Restrictions Weight Bearing Restrictions Per Provider Order: No General:     Therapy/Group: Individual Therapy  Cardin Nitschke P Zebulon Gantt  PT, DPT, CSRS  07/16/2023, 7:53 AM

## 2023-07-16 NOTE — Progress Notes (Signed)
 Occupational Therapy Session Note  Patient Details  Name: EMALIE MCWETHY MRN: [id number] Date of Birth: Aug 02, 1975  Today's Date: 07/16/2023 OT Individual Time: 1005-1102 OT Individual Time Calculation (min): 57 min    Short Term Goals: Week 2:  OT Short Term Goal 1 (Week 2): Week 21: Pt will ambulate to BR with RW with min A with min cues for safety OT Short Term Goal 1 - Progress (Week 2): Met OT Short Term Goal 2 (Week 2): Week 21 Patient will demonstrate safe sitting balance while completing familiar BADL task with min cueing- progressing OT Short Term Goal 2 - Progress (Week 2): Other (comment) OT Short Term Goal 3 (Week 2): Week 21Pt will perform a groom task standing at the sink with min A for 1 min with min cues OT Short Term Goal 3 - Progress (Week 2): Met OT Short Term Goal 4 (Week 2): WEek 21 Pt will bend down to retrieve items in drawer in prep for ADL from standing position with min A with RW OT Short Term Goal 4 - Progress (Week 2): Other (comment) OT Short Term Goal 5 (Week 2): xxx OT Short Term Goal 5 - Progress (Week 2): Other (comment)  Skilled Therapeutic Interventions/Progress Updates:   Patient received supine in bed - speaking with MD doing rounds.  Patient oriented to day and date when questioned.  Patient eager to take a shower - walked to bathroom with walker and supervision.  Patient needs intermittent cueing to remove clothes, glasses before showering - intermittent cueing for steps of shower.  Patient reports feeling trepidation about potential transfer to SNF.  Listened to patient concerns - mostly related to semiprivate room, and provided encouragement.  Patient showing improved initiation of self care skills - less set up required.  Patient maneuvering wheelchair to get paper towels, reach mouthwash, etc without assistance and with occasional cueing. Patient left able to move around in room in wheelchair with chair pad alarm in place and engaged.    Therapy  Documentation Precautions:  Precautions Precautions: Fall Precaution/Restrictions Comments: decreased stand balance- reliant on UE, Pain in LLE limits mobility at times Required Braces or Orthoses: Other Brace Other Brace: B adjustable night splints; B elbow "cosey" splints; L palm protector; R mitt Restrictions Weight Bearing Restrictions Per Provider Order: No   Therapy/Group: Individual Therapy  [name] 07/16/2023, 12:35 PM
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
## 2023-07-16 NOTE — Progress Notes (Signed)
 Patient ID: Nicole [name], female   DOB: April 23, 1976, 48 y.o.   MRN: [id number] Still waiting for management to finalize the payment process for SNF[facility]. Will await response from [facility] and [place].

## 2023-07-16 NOTE — Plan of Care (Signed)
  Problem: Consults Goal: RH STROKE PATIENT EDUCATION Description: See Patient Education module for education specifics  Outcome: Progressing   Problem: RH BOWEL ELIMINATION Goal: RH STG MANAGE BOWEL WITH ASSISTANCE Description: STG Manage Bowel with mod I Assistance. Outcome: Progressing Goal: RH STG MANAGE BOWEL W/MEDICATION W/ASSISTANCE Description: STG Manage Bowel with Medication with mod I Assistance. Outcome: Progressing   Problem: RH BLADDER ELIMINATION Goal: RH STG MANAGE BLADDER WITH ASSISTANCE Description: STG Manage Bladder With toileting Assistance Outcome: Progressing   Problem: RH SKIN INTEGRITY Goal: RH STG MAINTAIN SKIN INTEGRITY WITH ASSISTANCE Description: STG Maintain Skin Integrity With min  Assistance. Outcome: Progressing

## 2023-07-16 NOTE — Progress Notes (Signed)
 Speech Language Pathology Weekly Progress Note  Patient Details  Name: Nicole [name] MRN: [id number] Date of Birth: Jul 24, 1975  Beginning of progress report period: July 06, 2023 End of progress report period: July 16, 2023  Short Term Goals: Week 9: SLP Short Term Goal 1 (Week 9): Week 33 - Pt will solve mildly complex environmental problems with 85% accuracy given min assist. SLP Short Term Goal 1 - Progress (Week 9): Met SLP Short Term Goal 2 (Week 9): Week 33 - Pt will demonstrate emergent awareness of mistakes during functional environmental tasks and correct mistakes in 5/6 opportunities given min assist. SLP Short Term Goal 2 - Progress (Week 9): Met  New Short Term Goals: Week 9: SLP Short Term Goal 1 (Week 9): Week 34 - Pt will solve mildly complex environmental problems with 90% accuracy given min assist. SLP Short Term Goal 2 (Week 9): Week 34 - Pt will demonstrate emergent awareness of mistakes during functional environmental tasks and correct mistakes in 6/7 opportunities given min assist.  Weekly Progress Updates: Patient continues to make slow and steady progress towards therapy goals, meeting 2/2 short term goals set this reporting period. Patient requires min assist to solve mildly complex environmental problems with 85% accuracy and demonstrates emergent awareness of mistakes during functional environmental tasks and corrects these mistakes in 5/6 opportunities with min assist. Patient and family education ongoing. Patient will continue to benefit from skilled therapy services during remainder of CIR stay.    Intensity: Minumum of 1-2 x/day, 30 to 90 minutes Frequency: 1 to 3 out of 7 days Duration/Length of Stay: TBD due to placement Treatment/Interventions: Cognitive remediation/compensation;Internal/external aids;Speech/Language facilitation;Cueing hierarchy;Environmental controls;Therapeutic Activities;Functional tasks;Patient/family education;Therapeutic  Exercise  [name], M.A., CCC-SLP  [name] 07/16/2023, 12:59 PM

## 2023-07-16 NOTE — Progress Notes (Signed)
 Speech Language Pathology Daily Session Note  Patient Details  Name: Nicole [name] MRN: [id number] Date of Birth: March 23, 1976  Today's Date: 07/16/2023 SLP Individual Time: 4098-1191 SLP Individual Time Calculation (min): 32 min  Short Term Goals: Week 9: SLP Short Term Goal 1 (Week 9): Week 34 - Pt will solve mildly complex environmental problems with 90% accuracy given min assist. SLP Short Term Goal 2 (Week 9): Week 34 - Pt will demonstrate emergent awareness of mistakes during functional environmental tasks and correct mistakes in 6/7 opportunities given min assist.  Skilled Therapeutic Interventions:  Patient was seen in PM to address cognitive re- training. Pt was alert and seated upright in WC upon SLP arrival. She denied pain and was agreeable for session. Pt requesting to change her shirt prior to completion of session. Pt ambulating with RW to drawers, bending several time to pick up item, and ambulating to bathroom. Pt using one hand to hold her clothing item and another to navigate RW ultimately turning RW towards side during ambulation. Once asked pt able to verbalize possible effects of maneuvering RW in this manner. Pt ambulated to speech office with sup A cues. Given visual aids, pt challenged to identify problems and solutions which she completed with 83% acc with min A. In other minutes of session given safety scenarios presented verbally, pt completed task with 90% acc with sup A. At conclusion of session, pt ambulated back to her room and was seated upright in [facility] with chair alarm activated and call button within reach. SLP to continue POC.   Pain Pain Assessment Pain Scale: 0-10 Pain Score: 0-No pain  Therapy/Group: Individual Therapy  [name] 07/16/2023, 2:43 PM

## 2023-07-17 MED ORDER — MAGNESIUM HYDROXIDE 400 MG/5ML PO SUSP
30.0000 mL | Freq: Once | ORAL | Status: AC
  Administered 2023-07-17: 30 mL via ORAL
  Filled 2023-07-17: qty 30

## 2023-07-17 NOTE — Progress Notes (Signed)
 Patient ID: Nicole [name], female   DOB: 02-20-1976, 48 y.o.   MRN: [id number]  Continue to await management to work out payment-contract with [employer]. Once confirmed will let staff, pt and Mom know.

## 2023-07-17 NOTE — Progress Notes (Signed)
 Speech Language Pathology Daily Session Note  Patient Details  Name: PAULINE PEGUES MRN: [id number] Date of Birth: 1975-09-22  Today's Date: 07/17/2023 SLP Individual Time: 0830-0900 SLP Individual Time Calculation (min): 30 min  Short Term Goals: Week 9: SLP Short Term Goal 1 (Week 9): Week 34 - Pt will solve mildly complex environmental problems with 90% accuracy given min assist. SLP Short Term Goal 2 (Week 9): Week 34 - Pt will demonstrate emergent awareness of mistakes during functional environmental tasks and correct mistakes in 6/7 opportunities given min assist.  Skilled Therapeutic Interventions:   Pt greeted at bedside. She was awake/alert upon SLP arrival. She was eager to complete functional tx tasks (morning ADLs) targeting cognition. She benefited from minA visual/verbal cues for sequencing and problem solving during upper/lower body dressing, oral care, and personal hygiene. Additionally, she benefited from minA verbal cues for organization and reasoning to ID unwanted/unnecessary items throughout her room and organize ADL supplies. At the end of tx tasks, she was left in bed with the alarm set and call light within reach. Recommend cont ST.   Pain Pain Assessment Pain Scale: 0-10 Pain Score: 4  Pain Type: Chronic pain Pain Location: Back Pain Orientation: Upper;Lower Pain Descriptors / Indicators: Aching;Discomfort Pain Intervention(s): Medication (See eMAR);Hot/Cold interventions  Therapy/Group: Individual Therapy  [name] 07/17/2023, 8:44 AM

## 2023-07-17 NOTE — Progress Notes (Signed)
 Physical Therapy Session Note  Patient Details  Name: Nicole [name] MRN: [id number] Date of Birth: [birth year]  Today's Date: 07/17/2023 PT Individual Time: 4098-1191 PT Individual Time Calculation (min): 55 min   Short Term Goals: Week 1:  PT Short Term Goal 1 (Week 1): Pt will perform supine<>sit with supervision PT Short Term Goal 1 - Progress (Week 1): Other (comment) (new goal) PT Short Term Goal 2 (Week 1): Pt will ambulate at least 141ft with no more than min A, not using an AD. PT Short Term Goal 2 - Progress (Week 1): Other (comment) (new goal) PT Short Term Goal 3 (Week 1): Pt will demonstrate decreased fall risk as noted by an improvement of at least 7 points on the [employer] Test PT Short Term Goal 3 - Progress (Week 1): Other (comment) (new goal) PT Short Term Goal 4 (Week 1): Pt will navigate 4 steps using HRs with no more than CGA  Skilled Therapeutic Interventions/Progress Updates:      Pt sitting EOB finishing up her lunch. Has no c/o pain and is agreeable to PT treatment. Functional mobility completed throughout session with no AD. Sit<>Stand with CGA. Ambulates with CGA and no AD through CIR hallways with intermittent L foot catching during swing phase. Elevator taken downstairs to 1st floor to focus session on community mobility, unlevel gait training, safety awareness. While outdoors, ambulated unmeasured distances > 735ft with CGA to minA with no AD - as she becomes fatigues she has frequent LOB anteriorly with L foot catching. Practiced walking across streets (via crosswalks) with mod cues for safety awareness for monitoring oncoming traffic. Curb transfers practiced with minA and no AD with safety cues and pacing needed. Unlevel gait on sloped surfaces, grass, mulch, and wooded surfaces with minA and no AD. Returned upstairs to CIR floor and patient left sitting upright at EOB with alarm on, meal setup, all needs met.   Therapy Documentation Precautions:   Precautions Precautions: Fall Precaution/Restrictions Comments: decreased stand balance- reliant on UE, Pain in LLE limits mobility at times Required Braces or Orthoses: Other Brace Other Brace: B adjustable night splints; B elbow "cosey" splints; L palm protector; R mitt Restrictions Weight Bearing Restrictions Per Provider Order: No General:     Therapy/Group: Individual Therapy  Orrin Brigham 07/17/2023, 7:47 AM

## 2023-07-17 NOTE — Progress Notes (Signed)
 Occupational Therapy Session Note  Patient Details  Name: Nicole [name] MRN: [id number] Date of Birth: [birth year]  Today's Date: 07/17/2023 OT Individual Time: 1400-1430 OT Individual Time Calculation (min): 30 min    Short Term Goals: See OT POC, week 34  Skilled Therapeutic Interventions/Progress Updates:    Pt received sitting EOB calling out to use the bathroom. She used the RW to ambulate into the bathroom with close (S), rushing through transfer and requiring cueing for pacing. She was able to manage clothing, remove old brief and wipe following with (S). She completed 150 ft of functional mobility to the therapy gym with close (S). She worked on floor transfers, getting from standing to kneeling and then back up to standing with min A overall, occasional times of mod A. She did require mod cueing for hand placement and sequencing. Carryover to fall prevention and recovery. She then completed dual processing activity- throwing/catching a ball while ambulating- she had a large LOB forward as she reached for the ball on the ground, requiring max A from OT to prevent fall. She was able to complete the rest of the activity, a remaining 50 ft, with CGA. She returned to her room and was left sitting EOB with all needs met, bed alarm set.   Therapy Documentation Precautions:  Precautions Precautions: Fall Precaution/Restrictions Comments: decreased stand balance- reliant on UE, Pain in LLE limits mobility at times Required Braces or Orthoses: Other Brace Other Brace: B adjustable night splints; B elbow "cosey" splints; L palm protector; R mitt Restrictions Weight Bearing Restrictions Per Provider Order: No   Therapy/Group: Individual Therapy  Crissie Reese 07/17/2023, 6:32 AM

## 2023-07-18 MED ORDER — PRAZOSIN HCL 1 MG PO CAPS
1.0000 mg | ORAL_CAPSULE | Freq: Every day | ORAL | Status: DC
  Administered 2023-07-18: 1 mg via ORAL
  Filled 2023-07-18 (×2): qty 1

## 2023-07-18 NOTE — Plan of Care (Signed)
  Problem: Consults Goal: RH STROKE PATIENT EDUCATION Description: See Patient Education module for education specifics  07/18/2023 0534 by Pamala Duffel, RN Outcome: Progressing 07/18/2023 0529 by Pamala Duffel, RN Outcome: Progressing   Problem: RH BOWEL ELIMINATION Goal: RH STG MANAGE BOWEL WITH ASSISTANCE Description: STG Manage Bowel with mod I Assistance. 07/18/2023 0534 by Pamala Duffel, RN Outcome: Progressing 07/18/2023 0529 by Pamala Duffel, RN Outcome: Progressing Goal: RH STG MANAGE BOWEL W/MEDICATION W/ASSISTANCE Description: STG Manage Bowel with Medication with mod I Assistance. 07/18/2023 0534 by Pamala Duffel, RN Outcome: Progressing 07/18/2023 0529 by Pamala Duffel, RN Outcome: Progressing   Problem: RH BLADDER ELIMINATION Goal: RH STG MANAGE BLADDER WITH ASSISTANCE Description: STG Manage Bladder With toileting Assistance 07/18/2023 0534 by Pamala Duffel, RN Outcome: Progressing 07/18/2023 0529 by Pamala Duffel, RN Outcome: Progressing   Problem: RH SKIN INTEGRITY Goal: RH STG MAINTAIN SKIN INTEGRITY WITH ASSISTANCE Description: STG Maintain Skin Integrity With min  Assistance. 07/18/2023 0534 by Pamala Duffel, RN Outcome: Progressing 07/18/2023 0529 by Pamala Duffel, RN Outcome: Progressing

## 2023-07-18 NOTE — Plan of Care (Signed)
  Problem: Consults Goal: RH STROKE PATIENT EDUCATION Description: See Patient Education module for education specifics  Outcome: Progressing   Problem: RH BOWEL ELIMINATION Goal: RH STG MANAGE BOWEL WITH ASSISTANCE Description: STG Manage Bowel with mod I Assistance. Outcome: Progressing Goal: RH STG MANAGE BOWEL W/MEDICATION W/ASSISTANCE Description: STG Manage Bowel with Medication with mod I Assistance. Outcome: Progressing   Problem: RH BLADDER ELIMINATION Goal: RH STG MANAGE BLADDER WITH ASSISTANCE Description: STG Manage Bladder With toileting Assistance Outcome: Progressing   Problem: RH SKIN INTEGRITY Goal: RH STG MAINTAIN SKIN INTEGRITY WITH ASSISTANCE Description: STG Maintain Skin Integrity With min  Assistance. Outcome: Progressing

## 2023-07-18 NOTE — Progress Notes (Signed)
 Occupational Therapy Session Note  Patient Details  Name: Nicole [name] MRN: [id number] Date of Birth: [birth year]  Today's Date: 07/18/2023 OT Individual Time: 0930-1000 OT Individual Time Calculation (min): 30 min    Short Term Goals: See POC for STG  Skilled Therapeutic Interventions/Progress Updates:   Patient received seated on edge of bed eager to get up to clean her teeth.  Patient able to brush her teeth in two minutes without prompting.  Patient walked to bathroom to void, then needed to have her brief changed.  Patient continues to have a goal of managing hair care.  Patient can brush hair but still needs assistance to pull into pony tail, and patient unable to self organize for this task despite cueing.  Left up in wheelchair with chair pad alarm in place and engaged.    Therapy Documentation Precautions:  Precautions Precautions: Fall Precaution/Restrictions Comments: decreased stand balance- reliant on UE, Pain in LLE limits mobility at times Required Braces or Orthoses: Other Brace Other Brace: B adjustable night splints; B elbow "cosey" splints; L palm protector; R mitt Restrictions Weight Bearing Restrictions Per Provider Order: No   Pain:  Denies pain ADL: ADL Eating: Maximal assistance Where Assessed-Eating: Chair Grooming: Dependent Where Assessed-Grooming: Bed level Upper Body Bathing: Dependent Where Assessed-Upper Body Bathing: Bed level Lower Body Bathing: Dependent Where Assessed-Lower Body Bathing: Bed level Upper Body Dressing: Dependent Where Assessed-Upper Body Dressing: Bed level Lower Body Dressing: Dependent Where Assessed-Lower Body Dressing: Bed level Toileting: Dependent Where Assessed-Toileting: Bed level Toilet Transfer: Unable to assess Psychologist, counselling Transfer: Unable to assess    Therapy/Group: Individual Therapy  [name] 07/18/2023, 12:36 PM

## 2023-07-18 NOTE — Progress Notes (Signed)
 Physical Therapy Session Note  Patient Details  Name: Nicole [name] MRN: [id number] Date of Birth: Jan 10, 1976  Today's Date: 07/18/2023 PT Individual Time: 1132-1158 PT Individual Time Calculation (min): 26 min   Short Term Goals: Week 1:  PT Short Term Goal 1 (Week 1): Pt will perform supine<>sit with supervision PT Short Term Goal 1 - Progress (Week 1): Other (comment) (new goal) PT Short Term Goal 2 (Week 1): Pt will ambulate at least 157ft with no more than min A, not using an AD. PT Short Term Goal 2 - Progress (Week 1): Other (comment) (new goal) PT Short Term Goal 3 (Week 1): Pt will demonstrate decreased fall risk as noted by an improvement of at least 7 points on the [employer] Test PT Short Term Goal 3 - Progress (Week 1): Other (comment) (new goal) PT Short Term Goal 4 (Week 1): Pt will navigate 4 steps using HRs with no more than CGA  Skilled Therapeutic Interventions/Progress Updates:      Pt sitting EOB with bed alarm going off - reminded her of safety precautions for fall prevention. Pt reports she was just getting ready for therapy. No complaints of pain. Donned socks and shoes with setupA as she sat EOB. Sit<>Stand with close supervision and no UE support. She ambulates ~281ft to rehab gym with close supervision to intermittent CGA with no AD. Worked on mat table for overhead lat stretches with gentle PROM for B shoulder extension, limited on L > R and some shoulder pain present. Core stability strengthening facilitated with reverse crunches with 3x6 with min cues for sequencing 2/2 motor planning deficits. Returned to her room with similar assist as above and left in bed with her needs met, bed alarm on.   Therapy Documentation Precautions:  Precautions Precautions: Fall Precaution/Restrictions Comments: decreased stand balance- reliant on UE, Pain in LLE limits mobility at times Required Braces or Orthoses: Other Brace Other Brace: B adjustable night splints; B elbow  "cosey" splints; L palm protector; R mitt Restrictions Weight Bearing Restrictions Per Provider Order: No General:     Therapy/Group: Individual Therapy  [name] P Kinneth Fujiwara 07/18/2023, 7:45 AM

## 2023-07-18 NOTE — Progress Notes (Signed)
 Speech Language Pathology Daily Session Note  Patient Details  Name: ALENCIA GORDON MRN: [id number] Date of Birth: January 19, 1976  Today's Date: 07/18/2023 SLP Individual Time: 1400-1430 SLP Individual Time Calculation (min): 30 min  Short Term Goals: Week 9: SLP Short Term Goal 1 (Week 9): Week 34 - Pt will solve mildly complex environmental problems with 90% accuracy given min assist. SLP Short Term Goal 2 (Week 9): Week 34 - Pt will demonstrate emergent awareness of mistakes during functional environmental tasks and correct mistakes in 6/7 opportunities given min assist.  Skilled Therapeutic Interventions:   Pt and her mom greeted in her room. She was assisted to the bathroom upon request. She ambulated to the bathroom via rolling walker w/ standby assist. She was incontinent of bladder, though able to complete peri care/brief change with only supervision physical assist and supervision cues for problem solving. She then ambulated to the ST office w/ standby assist. She benefited from minA visual/verbal cues for sustained attention and attention to detail during visual organization task. She benefited from supervision cues to self correct errors during visual organization task. She requested to remain at the nurses station and was left with her chair alarm set and staff present. Recommend cont ST per POC.   Pain No pain reported during tx session.   Therapy/Group: Individual Therapy  [name] 07/18/2023, 2:29 PM

## 2023-07-18 NOTE — Patient Care Conference (Signed)
 Inpatient RehabilitationTeam Conference and Plan of Care Update Date: 07/18/2023   Time: 11:05 AM    Patient Name: Nicole [name]      Medical Record Number: [id number]  Date of Birth: Jul 04, 1975 Sex: Female         Room/Bed: 4W09C/4W09C-01 Payor Info: Payor: [employer] / Plan: [employer] / Product Type: *No Product type* /    Admit Date/Time:  11/18/2022  3:15 PM  Primary Diagnosis:  Hypoxic brain injury [facility])  Hospital Problems: Principal Problem:   Hypoxic brain injury (HCC) Active Problems:   Protein-calorie malnutrition, severe   Essential hypertension   Pressure injury of skin   History of cocaine abuse (HCC)   Anxiety with depression    Expected Discharge Date: Expected Discharge Date:  (SNF pending)  Team Members Present: Physician leading conference: Dr. Sula Soda Social Worker Present: Dossie Der, LCSW Nurse Present: Chana Bode, RN PT Present: Wynelle Link, PT OT Present: Bretta Bang, OT SLP Present: [name], SLP PPS Coordinator present : Fae Pippin, SLP     Current Status/Progress Goal Weekly Team Focus  Bowel/Bladder   patient is contience with inncontient episodes   decrease the frequency in incontient episodes   Ask patient if she needs to use the restroom during rounds    Swallow/Nutrition/ Hydration               ADL's   intermittent min assist   goals supervision - contact guard   discharge disposition    Mobility   No update. CGA to supervision. Ambulating wtihout an AD needing minA when fatigued.   Supervision/CGA  General mobility, dual-cog, safety awareness    Communication                Safety/Cognition/ Behavioral Observations  modA for cognitive tasks of increasing complexity - attention, processing, problem solving/reasoning, impulsive responses, initiation, minA for functional tasks   supervision for functional task   functional problem solving, error awareness, processing, impulse  control    Pain   patient has history of lower back pain   decrease back pain frequency   Decrease the amount of times patient needs pain meds    Skin   Skin intact   No new onset of skin breakdown  Continue to assess her skin QS and PRN      Discharge Planning:  Continue to await contract for [facility] to take pt. Hopefully will happen soon.   Team Discussion: Patient post hypoxic brain injury. Patient continues to demonstrate slow improvement in function and is limited by selective attention, problem solving deficits, poor carry over of strategies which impairs her ability to manage dual cognitive tasks.  Limited by back pain; MD adjusted meds and nightmares.  Patient on target to meet rehab goals: Currently needs CGA - supervision overall. Needs mod assist for cognitive tasks, attention, processing and management of impulsive responses, safety awareness, and management of dual tasks.   *See Care Plan and progress notes for long and short-term goals.   Revisions to Treatment Plan:  N/a   Teaching Needs: Safety, medications, skin care, transfers, toileting, etc.   Current Barriers to Discharge: Home enviroment access/layout and Lack of/limited family support, lack of insurance for SNF and follow up services  Possible Resolutions to Barriers: SNF recommended Awaiting long term disability for funding of SNF  [facility] SNF assessment for placement with LOG      Medical Summary Current Status: back pain, urinary retention, nightmares  Barriers to Discharge:  Medical stability  Barriers to Discharge Comments: back pain, urinary retenion, nightmares Possible Resolutions to [employer] Focus: continue tramadol and lidocaine, continue myrbetriq, prazosin added   Continued Need for Acute Rehabilitation Level of Care: The patient requires daily medical management by a physician with specialized training in physical medicine and rehabilitation for the following  reasons: Direction of a multidisciplinary physical rehabilitation program to maximize functional independence : Yes Medical management of patient stability for increased activity during participation in an intensive rehabilitation regime.: Yes Analysis of laboratory values and/or radiology reports with any subsequent need for medication adjustment and/or medical intervention. : Yes   I attest that I was present, lead the team conference, and concur with the assessment and plan of the team.   Chana Bode B 07/18/2023, 3:09 PM
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
## 2023-07-19 ENCOUNTER — Emergency Department (HOSPITAL_COMMUNITY)
Admission: EM | Admit: 2023-07-19 | Discharge: 2023-07-19 | Disposition: A | Discharge status: Home / Self Care | Payer: MEDICAID | Attending: Emergency Medicine | Admitting: Emergency Medicine

## 2023-07-19 ENCOUNTER — Encounter (HOSPITAL_COMMUNITY): Payer: Self-pay

## 2023-07-19 ENCOUNTER — Other Ambulatory Visit: Payer: Self-pay

## 2023-07-19 ENCOUNTER — Emergency Department (HOSPITAL_COMMUNITY): Payer: MEDICAID

## 2023-07-19 DIAGNOSIS — Z0001 Encounter for general adult medical examination with abnormal findings: Secondary | ICD-10-CM | POA: Insufficient documentation

## 2023-07-19 DIAGNOSIS — I10 Essential (primary) hypertension: Secondary | ICD-10-CM | POA: Insufficient documentation

## 2023-07-19 DIAGNOSIS — R799 Abnormal finding of blood chemistry, unspecified: Secondary | ICD-10-CM | POA: Insufficient documentation

## 2023-07-19 DIAGNOSIS — Z0189 Encounter for other specified special examinations: Secondary | ICD-10-CM

## 2023-07-19 DIAGNOSIS — J45909 Unspecified asthma, uncomplicated: Secondary | ICD-10-CM | POA: Insufficient documentation

## 2023-07-19 MED ORDER — ACETAMINOPHEN 500 MG PO TABS
1000.0000 mg | ORAL_TABLET | Freq: Once | ORAL | Status: AC
  Administered 2023-07-19: 1000 mg via ORAL
  Filled 2023-07-19: qty 2

## 2023-07-19 MED ORDER — LEVALBUTEROL TARTRATE 45 MCG/ACT IN AERO
1.0000 | INHALATION_SPRAY | Freq: Four times a day (QID) | RESPIRATORY_TRACT | Status: AC | PRN
Start: 2023-07-19 — End: ?

## 2023-07-19 MED ORDER — VITAMIN D3 25 MCG PO TABS: 4000.0000 [IU] | ORAL_TABLET | Freq: Every day | ORAL | Status: AC

## 2023-07-19 MED ORDER — MAGNESIUM GLUCONATE 500 MG PO TABS: 250.0000 mg | ORAL_TABLET | Freq: Every day | ORAL | Status: AC

## 2023-07-19 MED ORDER — LIDOCAINE 5 % EX PTCH: 2.0000 | MEDICATED_PATCH | CUTANEOUS | 0 refills | Status: AC

## 2023-07-19 MED ORDER — TRAMADOL HCL 50 MG PO TABS
50.0000 mg | ORAL_TABLET | Freq: Once | ORAL | Status: AC
  Administered 2023-07-19: 50 mg via ORAL
  Filled 2023-07-19: qty 1

## 2023-07-19 MED ORDER — MIRABEGRON ER 25 MG PO TB24: 25.0000 mg | ORAL_TABLET | Freq: Every day | ORAL | Status: AC

## 2023-07-19 MED ORDER — TRAMADOL HCL 50 MG PO TABS: 50.0000 mg | ORAL_TABLET | Freq: Two times a day (BID) | ORAL | 0 refills | Status: AC | PRN

## 2023-07-19 MED ORDER — PRAZOSIN HCL 1 MG PO CAPS: 1.0000 mg | ORAL_CAPSULE | Freq: Every day | ORAL | Status: DC

## 2023-07-19 NOTE — Progress Notes (Signed)
 Physical Therapy Discharge Summary  Patient Details  Name: Nicole [name] MRN: [id number] Date of Birth: November 14, 1975  Date of Discharge from PT service:July 19, 2023  Patient has met 5 of 9 long term goals due to improved activity tolerance, improved balance, improved postural control, increased strength, increased range of motion, decreased pain, ability to compensate for deficits, functional use of  right upper extremity, right lower extremity, left upper extremity, and left lower extremity, improved attention, improved awareness, and improved coordination.  Patient to discharge at an ambulatory level  CGA .   Patient's care partner unavailable to provide the necessary physical and cognitive assistance at discharge. Therefore, SNF recommended for continued care and rehabilitation.   Reasons goals not met: Pt required intermittent minA for stairs, floor transfers, and supervision for transfers due to mild impulsivity and poor safety awareness.   Recommendation:  Patient will benefit from ongoing skilled PT services in skilled nursing facility setting to continue to advance safe functional mobility, address ongoing impairments in dynamic balance, dynamic gait, and minimize fall risk.  Equipment: No equipment provided - TBD by next level of care.   Reasons for discharge: treatment goals met and discharge from hospital  Patient/family agrees with progress made and goals achieved: Yes  PT Discharge Precautions/Restrictions Precautions Precautions: Fall Precaution/Restrictions Comments: decreased stand balance- reliant on UE, Pain in LLE limits mobility at times Restrictions Weight Bearing Restrictions Per Provider Order: No Pain Interference Pain Interference Pain Effect on Sleep: 3. Frequently Pain Interference with Therapy Activities: 2. Occasionally Pain Interference with Day-to-Day Activities: 3. Frequently Vision/Perception  Vision - History Ability to See in Adequate Light: 0  Adequate Perception Perception: Impaired Preception Impairment Details: Inattention/Neglect Praxis Praxis: Impaired Praxis Impairment Details: Initiation;Motor planning;Organization  Cognition Overall Cognitive Status: Impaired/Different from baseline Arousal/Alertness: Awake/alert Orientation Level: Oriented to person;Oriented to place;Oriented to situation Attention: Selective Focused Attention: Appears intact Focused Attention Impairment: Functional basic Sustained Attention: Appears intact Sustained Attention Impairment: Functional basic Selective Attention: Impaired Selective Attention Impairment: Functional basic Memory: Impaired Memory Impairment: Storage deficit Awareness: Impaired Awareness Impairment: Emergent impairment Problem Solving: Impaired Problem Solving Impairment: Functional basic;Functional complex Executive Function: Organizing;Initiating;Self Monitoring;Self Correcting;Decision Making Organizing: Impaired Organizing Impairment: Functional complex;Functional basic Decision Making: Impaired Decision Making Impairment: Functional basic;Functional complex Initiating: Impaired Initiating Impairment: Functional basic;Functional complex Self Monitoring: Impaired Self Monitoring Impairment: Functional basic;Functional complex Self Correcting: Impaired Self Correcting Impairment: Functional basic;Functional complex Behaviors: Impulsive Safety/Judgment: Impaired Comments: perseverative movement tics [email or web address] Scales of Cognitive Functioning: Purposeful, Appropriate: Stand-by Assistance on Request Sensation Sensation Light Touch: Appears Intact Hot/Cold: Appears Intact Proprioception: Impaired by gross assessment Stereognosis: Impaired by gross assessment Coordination Gross Motor Movements are Fluid and Coordinated: No Fine Motor Movements are Fluid and Coordinated: No Motor  Motor Motor: Abnormal tone;Motor apraxia;Hemiplegia;Abnormal postural  alignment and control;Motor impersistence;Motor perseverations Motor - Discharge Observations: Left hemiplegia, hypertension LUE, Motor tics, at times athetoid like movement in head/neck/trunk  Mobility Bed Mobility Bed Mobility: Rolling Right;Rolling Left;Supine to Sit;Sit to Supine Rolling Right: Independent with assistive device Rolling Left: Independent with assistive device Supine to Sit: Independent with assistive device Sit to Supine: Independent with assistive device Transfers Transfers: Sit to Stand;Stand to Sit;Stand Pivot Transfers Sit to Stand: Supervision/Verbal cueing Stand to Sit: Supervision/Verbal cueing Stand Pivot Transfers: Supervision/Verbal cueing Transfer (Assistive device): Rolling walker Locomotion  Gait Ambulation: Yes Gait Assistance: Supervision/Verbal cueing Gait Distance (Feet): 500 Feet Assistive device: Rolling walker Gait Assistance Details: Verbal cues for safe use of DME/AE;Verbal cues for precautions/safety;Verbal  cues for gait pattern Gait Gait: Yes Gait Pattern: Impaired Gait Pattern: Step-through pattern;Decreased step length - left;Decreased dorsiflexion - left;Poor foot clearance - left;Wide base of support Stairs / Additional Locomotion Stairs: Yes Stairs Assistance: Contact Guard/Touching assist;Minimal Assistance - Patient > 75% Stair Management Technique: Two rails;Alternating pattern;Forwards Number of Stairs: 12 Height of Stairs: 6 Pick up small object from the floor assist level: Minimal Assistance - Patient > 75% Wheelchair Mobility Wheelchair Mobility: Yes Wheelchair Assistance: Doctor, general practice: Both upper extremities Wheelchair Parts Management: Needs assistance Distance: 150'  Trunk/Postural Assessment  Cervical Assessment Cervical Assessment: Exceptions to [facility] (forward head) Thoracic Assessment Thoracic Assessment: Exceptions to [facility] (flexed thoracic spine/trunk) Lumbar Assessment Lumbar  Assessment: Exceptions to [facility] (posterior pelvis) Postural Control Head Control: WFL Righting Reactions: Delayed Protective Responses: Ineffective  Balance Balance Balance Assessed: Yes Static Sitting Balance Static Sitting - Balance Support: No upper extremity supported;Feet supported Static Sitting - Level of Assistance: 7: Independent Extremity Assessment  RUE Assessment RUE Assessment: Exceptions to [facility] Active Range of Motion (AROM) Comments: 140* shoulder flexion minimal compensations LUE Assessment Active Range of Motion (AROM) Comments: 110* shoulder flexion - with forward head, breath hold, and trunk extension, -108 elbow extension, limited MCP Extension PIP/DIP flexion LUE Body System: Neuro Brunstrum levels for arm and hand: Arm;Hand Brunstrum level for arm: Stage IV Movement is deviating from synergy Brunstrum level for hand: Stage IV Movements deviating from synergies RLE Assessment RLE Assessment: Exceptions to [facility] General Strength Comments: Grossly 4-/5 LLE Assessment LLE Assessment: Exceptions to [facility] General Strength Comments: Grossly 4-/5   Daphnie Venturini P Jerelle Virden  PT, DPT, CSRS  07/19/2023, 3:28 PM

## 2023-07-19 NOTE — Progress Notes (Signed)
 Inpatient Rehabilitation Discharge Medication Review by a Pharmacist  A complete drug regimen review was completed for this patient to identify any potential clinically significant medication issues.  High Risk Drug Classes Is patient taking? Indication by Medication  Antipsychotic No    No   Antibiotic No   Opioid Yes Tramadol- pain  Antiplatelet No   Hypoglycemics/insulin No   Vasoactive Medication Yes Prazosin-Mood stabilization   Chemotherapy No   Other Yes Tylenol, lidocaine patch: pain MVI, Vitamin D: vitamin/supplement  Magnesium gluconate: constipation/supplement Mirabegron ER: urinary retention Levalbuterol inhaler: PRN wheezing, shortness of breath     Type of Medication Issue Identified Description of Issue Recommendation(s)  Drug Interaction(s) (clinically significant)     Duplicate Therapy     Allergy     No Medication Administration End Date     Incorrect Dose     Additional Drug Therapy Needed     Significant med changes from prior encounter (inform family/care partners about these prior to discharge).    Other       Clinically significant medication issues were identified that warrant physician communication and completion of prescribed/recommended actions by midnight of the next day:  No   Time spent performing this drug regimen review (minutes):  20  Thank you for allowing pharmacy to be a part of this patient's care.   Noah Delaine, RPh Clinical Pharmacist 07/19/2023 10:35 AM

## 2023-07-19 NOTE — Progress Notes (Signed)
 Recreational Therapy Discharge Summary Patient Details  Name: Nicole [name] MRN: [id number] Date of Birth: 1976/04/01 Today's Date: 07/19/2023  Comments on progress toward goals: Pt has made great progress during extended CIR LOS.  TR sessions focused on activity analysis/modifications, dynamic balance, UE use, cognition, safety awareness, functional mobility, social & emotional health.  Pt is discharging today at overall supervision ambulatory level.  Reasons for discharge: discharge from hospital  Follow-up:  encourage participation in activities program/recreational therapy   Patient/family agrees with progress made and goals achieved: Yes  [name] 07/19/2023, 12:48 PM

## 2023-07-19 NOTE — ED Notes (Signed)
 Attempted to call facility, [facility], to provide update. Was on hold for the first call. Attempted to call 2 more times and the facility did not pick up.

## 2023-07-19 NOTE — Progress Notes (Signed)
 Patient ID: Nicole [name], female   DOB: [birth year], 48 y.o.   MRN: [id number]  [name] has worked out a Occupational hygienist for pt to transfer to [place] today. Both pt and mom are aware and will plan for transport for 1:00 pm. Pt is happy and says ready to go.

## 2023-07-19 NOTE — Progress Notes (Signed)
 Inpatient Rehabilitation Care Coordinator Discharge Note   Patient Details  Name: Nicole [name] MRN: [id number] Date of Birth: 27-Aug-1975   Discharge location: GOING TO [facility] FOR [facility] AND CARE  Length of Stay: 243 DAYS  Discharge activity level: CGA-MIN LEVEL  Home/community participation: ACTIVE  Patient response WU:[id number] Literacy - How often do you need to have someone help you when you read instructions, pamphlets, or other written material from your doctor or pharmacy?: Always  Patient response YN:WGNFAO Isolation - How often do you feel lonely or isolated from those around you?: Rarely  Services provided included: MD, RD, PT, OT, SLP, RN, CM, TR, Pharmacy, Neuropsych, SW  Financial Services:  Field seismologist Utilized: [employer]  Choices offered to/list presented to: PT AND MOM  Follow-up services arranged:  Other (Comment) (SNF)     SSD/SSI BEING REVIEWED BY SSD MD, STILL WAITING FOR DETERMINATION. LONG TERM CARE MEDICAID WAS APPROVED AND MA WORKER IS TANYA STARNES-[id number] MOM HAS GUARDIANSHIP OF PT      Patient response to transportation need: Is the patient able to respond to transportation needs?: Yes In the past 12 months, has lack of transportation kept you from medical appointments or from getting medications?: No In the past 12 months, has lack of transportation kept you from meetings, work, or from getting things needed for daily living?: No   Patient/Family verbalized understanding of follow-up arrangements:  Yes  Individual responsible for coordination of the follow-up plan: Fatima Blank [phone]  Confirmed correct DME delivered: Lucy Chris 07/19/2023    Comments (or additional information):[facility] HAS AGREED TO TAKE PT UNDER THERE DTP PATIENTS. AWARE SSD/SSI IS STILL PENDING  Summary of Stay    Date/Time Discharge Planning CSW  07/18/23 [phone] Continue to await contract for [facility] to take pt. Hopefully will happen  soon. RGD  07/11/23 6295 Working with Sonny Dandy to se eif can offer a bed-awaiting the financial approval. [name] SSD is still pending. Have still not heard back from [facility] regarding if provides coverage still or not since approved for Long term Medicaid RGD  07/03/23 2841 Continue to check with SSD-CM regarding status of her disability still pending. Have not heard from [facility] this week regarding if still her payer source. RGD  06/27/23 0836 Continue to await disability determination and then will have a source for payment. Koren Shiver is questioning if still payer source-will follow up with worker when figures it out RGD  06/20/23 0852 Continue to await on disablity determination and then will have payment source for ALF VS ICF bed. RGD  06/13/23 0929 Continue to look for ALF vs SNF bed, continue to await disability approval-disability MD is reviewing currently RGD  06/06/23 1000 Mom can not take home and continue to work on obtaining disability so can get a bed for pt. Pt is aware of this plan, not happy but aware. RGD  05/30/23 0834 Meeting today with Mom, MD and Director of Unit to discuss palns and possibility going home. Mom continues to report can not take care of pt RGD  05/23/23 0954 Looking into any facility to take pt still her disability has not been approved. Making progress in therapies and Mom feels not able to manage at home RGD  05/15/23 0842 Continue to await disabliyt approval so can get a bed, no offfers without her disability apporved as of yet. LOG available but no offers RGD  05/07/23 1549 Continue to work on [facility]. SNF once approved for disability. CJB  05/07/23 1531 Continue to  work with management on LOG approval, no bed offer without approved disability AAC  05/02/23 0840 Continue to work with management on LOG approval, no bed offer without approved disability RGD  04/24/23 1259 Continue to work with management on LOG approval to see if could obtain bed offer. Pt is  making progress in her therapies, Disability is still pending RGD  04/18/23 0846 Search continues for SNF bed-disability pending is the barrier will not get bed offer without this being approved. Working with management on this issue RGD  04/11/23 0919 Continue to search for SNF bed, may need to wait for disability to get approved so will have payment for facility. Mom states can not take care of her. RGD  04/04/23 0834 Continue to search for a long term care medicaid bed without success. [place] RGD  03/28/23 0833 Reached out to management for assist in bed search-no bed offers. Continue to search-pt making progress in therapies this week RGD  03/21/23 1016 Continue to search for NH bed, have reached out to liaisons to ask to consider pt. Barriers continue to be age, drug history care required RGD  03/14/23 0850 Continue to branch out looking for long term SNF bed. Barriers are her age, drug history and amount of care she requires RGD  03/07/23 0845 Her Medicaid was approved yesterday-have contacted [facility] and have sent out [facility] Mom aware may need to go to another facility until [facility] has a bed for pt RGD  02/28/23 0821 Continue to aw[place] long term care medicaid approval. Mom calling along with this worker RGD  02/14/23 0835 Continue to await long term care Medicaid approval. Mom visits every other day. RGD  02/07/23 0858 Continue to await long term care medicaid approval so can transfer her to a SNF. RGD  01/31/23 5284 Disability has been determinted as of 9/13 now waiting for long term care medicaid to be approved. Spoke with Medicaid worker who is currently working on this. [facility] may have filled the bed due to can not leave open for long periods of time RGD  01/24/23 0826 Continue to awiat disabilty determination so medicaid can be approved and then will place in SNF. Attempted three times to call Medicaid worker with no return call, will ask to speak with supervisior  RGD  01/16/23 1524 Mom was appointed guardianship of pt-paperwork placed in soft chart. Continue to await SSD/SSI and medicaid determination. Can then place her once payment source obtained RGD  01/10/23 0814 Mom has guardianship court date this week, continue to await disability approval and long term care medicaid will then be able to place her RGD  01/03/23 0829 Continue to await disablity determination so her LTC medicaid is approved and then will have payment source to go to a SNF. Her mom has guardianship court date next week. RGD  12/27/22 1324 Mom continues to work on long term care Medicaid may take up to 90 days have to wait for disability to determine hopefully it will not due to severity of pt's condition. [place] court date 8/25. RGD  12/20/22 0815 Mom working on Long term Care Medicaid-SSD/SSI application completed. Awaiting funding source so can transfer to [facility]. RGD  12/13/22 0839 Continue to work on SNF bed-[facility] where Mom works would like to offer but needs a payment source-[employer] is a MH Medicaid trying to see if can be switched to more of a medical medicaid policy and will assist Mom with LTC medicaid application. May need to await SSD/SSI determination  to have payment source once approved would get medicaid geared toward medical care. RGD  12/06/22 0865 Working with Mom on applying for SSD/SSI. Mom pursuing guardianship court date 8/29. Searching for SNF to offer a bed. Mom aware may be far away. Barrier alos her insurance newer and several SNF trying to figure out if take it or not RGD  11/29/22 0820 Mom will not be able to provide the care pt will require at discharge. Mom to begin guardianship processs. Have started [place] level 2 process. Will be difficult to place due to several factore, young age, severe deficits, drug history and insurance. RGD  11/22/22 0827 If can reach min assist Mom could manage but goals look like max assist. Will need to pursue SNF, will be  difficult placement due to insurance, drug history and is considered young. Mom very supportive and here often. Have begun SSD application, trying to find out if her insurance covers SNF RGD       Nettie Wyffels, Lemar Livings

## 2023-07-19 NOTE — Progress Notes (Signed)
 Occupational Therapy Discharge Summary  Patient Details  Name: Nicole [name] MRN: [id number] Date of Birth: 1976-02-18  Date of Discharge from OT service:July 19, 2023  Today's Date: 07/19/2023 OT Individual Time: 0930-1001 OT Individual Time Calculation (min): 31 min   Skilled Therapeutic Intervention: Patient received sitting on edge of bed very eager to get a shower and shave as today is her discharge day!  Patient walked to shower with supervision using RW.  Patient able to bathe herself while seated on shower bench, and shave with supervision.  Patient needs cues to not repeat tasks perseveratively.  Patient able to dress herself with the exception of minimal assistance to manage bra - pulling down back.  Patient actually able to put on a front opening short and manage her buttons without assistance.  Patient has made excellent progress during her extended stay on rehab.    Patient has met 8 of 9 long term goals due to improved activity tolerance, improved balance, postural control, ability to compensate for deficits, functional use of  RIGHT upper, RIGHT lower, LEFT upper, and LEFT lower extremity, improved attention, improved awareness, and improved coordination.  Patient to discharge at overall Supervision level.  Patient's care partner unavailable to provide the necessary physical and cognitive assistance at discharge.    Reasons goals not met: Patient occasionally needs assistance to don bra.    Recommendation:  Patient will benefit from ongoing skilled OT services in skilled nursing facility setting to continue to advance functional skills in the area of BADL and Reduce care partner burden.  Equipment: No equipment provided  Reasons for discharge: discharge from hospital  Patient/family agrees with progress made and goals achieved: Yes  OT Discharge Precautions/Restrictions  Precautions Precautions: Fall Restrictions Weight Bearing Restrictions Per Provider Order:  No Pain Pain Assessment Pain Scale: 0-10 Pain Score: 0-No pain ADL ADL Eating: Independent Where Assessed-Eating: Chair Grooming: Modified independent Where Assessed-Grooming: Chair, Sitting at sink Upper Body Bathing: Modified independent Where Assessed-Upper Body Bathing: Shower Lower Body Bathing: Supervision/safety Where Assessed-Lower Body Bathing: Shower Upper Body Dressing: Supervision/safety Where Assessed-Upper Body Dressing: Chair Lower Body Dressing: Supervision/safety Where Assessed-Lower Body Dressing: Chair, Standing at sink Toileting: Supervision/safety Where Assessed-Toileting: Teacher, adult education: Close supervision Toilet Transfer Method: Insurance claims handler: Unable to assess Film/video editor: Distant supervision Film/video editor Method: Designer, industrial/product: Sales promotion account executive Baseline Vision/History: 0 No visual deficits Patient Visual Report: Other (comment) Vision Assessment?: Yes Eye Alignment: Within Functional Limits Ocular Range of Motion: Within Functional Limits Alignment/Gaze Preference: Within Defined Limits Tracking/Visual Pursuits: Able to track stimulus in all quads without difficulty Saccades: Within functional limits Convergence: Within functional limits Additional Comments: Blurred vision Perception  Perception: Impaired Praxis Praxis: Impaired Praxis Impairment Details: Initiation;Motor planning;Organization Cognition Cognition Overall Cognitive Status: Impaired/Different from baseline Arousal/Alertness: Awake/alert Memory: Impaired Memory Impairment: Storage deficit Attention: Selective Focused Attention: Appears intact Focused Attention Impairment: Functional basic Sustained Attention: Appears intact Sustained Attention Impairment: Functional basic Selective Attention: Impaired Selective Attention Impairment: Functional basic Awareness: Impaired Awareness Impairment:  Emergent impairment Problem Solving: Impaired Problem Solving Impairment: Functional basic;Functional complex Executive Function: Organizing;Initiating;Self Monitoring;Self Correcting;Decision Making Organizing: Impaired Organizing Impairment: Functional complex;Functional basic Decision Making: Impaired Decision Making Impairment: Functional basic;Functional complex Initiating: Impaired Initiating Impairment: Functional basic;Functional complex Self Monitoring: Impaired Self Monitoring Impairment: Functional basic;Functional complex Self Correcting: Impaired Self Correcting Impairment: Functional basic;Functional complex Behaviors: Impulsive Safety/Judgment: Impaired Comments: perseverative movement tics [place] [id number] [address] Scales of Cognitive Functioning: Purposeful, Appropriate: Stand-by Assistance on  Request Brief Interview for Mental Status (BIMS) Repetition of Three Words (First Attempt): 3 Temporal Orientation: Year: Correct Temporal Orientation: Month: Accurate within 5 days Temporal Orientation: Day: Correct Recall: "Sock": Yes, no cue required Recall: "Blue": Yes, no cue required Recall: "Bed": Yes, no cue required BIMS Summary Score: 15 Sensation Sensation Light Touch: Appears Intact Hot/Cold: Appears Intact Proprioception: Impaired by gross assessment Stereognosis: Impaired by gross assessment Coordination Gross Motor Movements are Fluid and Coordinated: No Fine Motor Movements are Fluid and Coordinated: No Motor  Motor Motor: Abnormal tone;Motor apraxia;Hemiplegia;Abnormal postural alignment and control;Motor impersistence;Motor perseverations Motor - Discharge Observations: Left hemiplegia, hypertension LUE, Motor tics, at times athetoid like movement in head/neck/trunk Mobility  Bed Mobility Bed Mobility: Rolling Right;Rolling Left;Supine to Sit;Sit to Supine Rolling Right: Independent with assistive device Rolling Left: Independent with assistive  device Supine to Sit: Independent with assistive device Sit to Supine: Independent with assistive device Transfers Sit to Stand: Supervision/Verbal cueing Stand to Sit: Supervision/Verbal cueing  Trunk/Postural Assessment  Cervical Assessment Cervical Assessment: Exceptions to [facility] (forward head) Thoracic Assessment Thoracic Assessment: Exceptions to [facility] (flexed thoracic spine/trunk) Lumbar Assessment Lumbar Assessment: Exceptions to [facility] (posterior pelvis) Postural Control Head Control: WFL Righting Reactions: Delayed Protective Responses: Ineffective  Balance Balance Balance Assessed: Yes Static Sitting Balance Static Sitting - Balance Support: No upper extremity supported;Feet supported Static Sitting - Level of Assistance: 7: Independent Extremity/Trunk Assessment RUE Assessment RUE Assessment: Exceptions to [facility] Active Range of Motion (AROM) Comments: 140* shoulder flexion minimal compensations LUE Assessment Active Range of Motion (AROM) Comments: 110* shoulder flexion - with forward head, breath hold, and trunk extension, -108 elbow extension, limited MCP Extension PIP/DIP flexion LUE Body System: Neuro Brunstrum levels for arm and hand: Arm;Hand Brunstrum level for arm: Stage IV Movement is deviating from synergy Brunstrum level for hand: Stage IV Movements deviating from synergies   [name] 07/19/2023, 12:24 PM

## 2023-07-19 NOTE — ED Provider Notes (Signed)
 [place] EMERGENCY DEPARTMENT AT [facility] Provider Note  CSN: [id number] Arrival date & time: 07/19/23 1710  Chief Complaint(s) Abnormal Lab  HPI Nicole [name] is a 48 y.o. female with past medical history as below, significant for depression, HCV, OSA on CPAP, IBS, EtOH abuse, prior anoxic brain injury who presents to the ED with complaint of PPD test abnormal.  Patient reports she had PPD testing earlier today, reports that after a subcu injection her arm felt itchy.  Denies prior +PPD in the past, denies exposure to tuberculosis or recent travel to [place] country.  Never had positive PPD in the past. Feels okay, no complaints currently. No fever/chills/dib/arthralgias  Past Medical History Past Medical History:  Diagnosis Date   Abnormal pap    w LEEP   Alcoholism (HCC)    Allergy    Anxiety    Asthma    Asthma    Blood in stool    Depression    Depression (emotion)    Essential hypertension 01/29/2023   Frequent headaches    Hepatitis C, chronic (HCC)    hep c tx successful   Irritable bowel syndrome    OSA on CPAP - sees Dr. Jenne Campus in ENT 10/09/2013   Seasonal allergies    scratch test in 2006///mold issue in house   Patient Active Problem List   Diagnosis Date Noted   History of cocaine abuse (HCC) 05/17/2023   Anxiety with depression 05/17/2023   Essential hypertension 01/29/2023   Pressure injury of skin 01/29/2023   Protein-calorie malnutrition, severe 01/05/2023   Hypoxic brain injury (HCC) 11/18/2022   Cavitary pneumonia 11/05/2022   Substance use 11/05/2022   Acute respiratory failure (HCC) 10/31/2022   Nonallergic rhinitis 08/23/2020   Mild persistent asthma without complication 06/14/2020   Other allergic rhinitis 06/14/2020   Adverse reaction to food, subsequent encounter 06/14/2020   IBS (irritable bowel syndrome) 05/24/2016   Chronic venous insufficiency 05/24/2016   Migraine 05/24/2016   Pulmonary nodules 10/27/2014   Hepatitis C,  chronic (HCC)    Depression 10/09/2013   OSA on CPAP - sees Dr. Jenne Campus in ENT 10/09/2013   Allergic rhinitis 05/30/2012   Cough variant asthma 02/08/2012   Home Medication(s) Prior to Admission medications   Medication Sig Start Date End Date Taking? Authorizing Provider  acetaminophen (TYLENOL) 325 MG tablet Take 2 tablets (650 mg total) by mouth every 6 (six) hours as needed for mild pain, moderate pain or fever. 11/18/22   Almon Hercules, MD  cholecalciferol (CHOLECALCIFEROL) 25 MCG tablet Take 4 tablets (4,000 Units total) by mouth daily. 07/19/23   Angiulli, Mcarthur Rossetti, PA-C  levalbuterol [facility] HFA) 45 MCG/ACT inhaler Inhale 1-2 puffs into the lungs every 6 (six) hours as needed for wheezing or shortness of breath. 07/19/23   Angiulli, Mcarthur Rossetti, PA-C  lidocaine (LIDODERM) 5 % Place 2 patches onto the skin daily. Remove & Discard patch within 12 hours or as directed by MD 07/19/23   Angiulli, Mcarthur Rossetti, PA-C  magnesium gluconate (MAGONATE) 500 MG tablet Take 0.5 tablets (250 mg total) by mouth at bedtime. 07/19/23   Angiulli, Mcarthur Rossetti, PA-C  mirabegron ER (MYRBETRIQ) 25 MG TB24 tablet Take 1 tablet (25 mg total) by mouth daily. 07/19/23   Angiulli, Mcarthur Rossetti, PA-C  Multiple Vitamin (MULTIVITAMINS PO) Take 1 tablet by mouth daily.     [provider]  prazosin (MINIPRESS) 1 MG capsule Take 1 capsule (1 mg total) by mouth at bedtime. 07/19/23  Angiulli, Mcarthur Rossetti, PA-C  traMADol (ULTRAM) 50 MG tablet Take 1 tablet (50 mg total) by mouth every 12 (twelve) hours as needed for severe pain (pain score 7-10). 07/19/23   Angiulli, Mcarthur Rossetti, PA-C                                                                                                                                    Past Surgical History Past Surgical History:  Procedure Laterality Date   DILATION AND CURETTAGE OF UTERUS  1996, 2001, 2002   x 3   LEEP  2008   NASAL FRACTURE SURGERY  1999   repair   NASAL SEPTOPLASTY W/ TURBINOPLASTY      TONSILLECTOMY     Family History Family History  Problem Relation Age of Onset   Leukemia Father    Emphysema Mother    Colon polyps Mother    Diverticulitis Mother    Heart disease Father    Cancer Father        unsure origin; had spot on lung   Hypertension Father    Hepatitis C Father    Alcohol abuse Father    Drug abuse Father    Stroke Maternal Grandmother    Osteoporosis Maternal Grandmother    Alcohol abuse Maternal Grandfather    Liver disease Maternal Grandfather    Migraines Neg Hx     Social History Social History   Tobacco Use   Smoking status: Never   Smokeless tobacco: Never  Vaping Use   Vaping status: Never Used  Substance Use Topics   Alcohol use: Yes    Comment: occassional use   Drug use: Yes    Types: "Crack" cocaine    Comment: crack   Allergies [place] nut (berthollefia excelsa), Cashew nut (anacardium occidentale) skin test, Triptans, Lexapro [escitalopram oxalate], Ceclor [cefaclor], and Nickel  Review of Systems A thorough review of systems was obtained and all systems are negative except as noted in the HPI and PMH.   Physical Exam Vital Signs  I have reviewed the triage vital signs BP 114/88   Pulse 95   Temp 98.5 F (36.9 C) (Oral)   Resp 16   Ht 5' (1.524 m)   Wt 62.1 kg   SpO2 96%   BMI 26.76 kg/m  Physical Exam Vitals and nursing note reviewed.  Constitutional:      General: She is not in acute distress.    Appearance: Normal appearance.  HENT:     Head: Normocephalic and atraumatic.     Right Ear: External ear normal.     Left Ear: External ear normal.     Nose: Nose normal.     Mouth/Throat:     Mouth: Mucous membranes are moist.  Eyes:     General: No scleral icterus.       Right eye: No discharge.        Left eye: No discharge.  Cardiovascular:     Rate and Rhythm: Normal rate and regular rhythm.     Pulses: Normal pulses.     Heart sounds: Normal heart sounds.  Pulmonary:     Effort: Pulmonary effort is  normal. No respiratory distress.     Breath sounds: Normal breath sounds. No stridor.  Abdominal:     General: Abdomen is flat. There is no distension.     Palpations: Abdomen is soft.     Tenderness: There is no abdominal tenderness.  Musculoskeletal:     Cervical back: No rigidity.     Right lower leg: No edema.     Left lower leg: No edema.  Skin:    General: Skin is warm and dry.     Capillary Refill: Capillary refill takes less than 2 seconds.  Neurological:     Mental Status: She is alert.  Psychiatric:        Mood and Affect: Mood normal.        Behavior: Behavior normal. Behavior is cooperative.     ED Results and Treatments Labs (all labs ordered are listed, but only abnormal results are displayed) Labs Reviewed - No data to display                                                                                                                        Radiology DG Chest 2 View Result Date: 07/19/2023 CLINICAL DATA:  Positive PPD EXAM: CHEST - 2 VIEW COMPARISON:  11/14/2022 FINDINGS: The heart size and mediastinal contours are within normal limits. Both lungs are clear. The visualized skeletal structures are unremarkable. IMPRESSION: No active cardiopulmonary disease. Electronically Signed   By: Helyn Numbers M.D.   On: 07/19/2023 20:44    Pertinent labs & imaging results that were available during my care of the patient were reviewed by me and considered in my medical decision making (see MDM for details).  Medications Ordered in ED Medications  acetaminophen (TYLENOL) tablet 1,000 mg (1,000 mg Oral Given 07/19/23 2035)                                                                                                                                     Procedures Procedures  (including critical care time)  Medical Decision Making / ED Course    Medical Decision Making:    LAKETTA SODERBERG is a 48 y.o. female with past medical  history as below, significant for depression,  HCV, OSA on CPAP, IBS, EtOH abuse, anoxic brain injury who presents to the ED with complaint of PPD test abnormal.. The complaint involves an extensive differential diagnosis and also carries with it a high risk of complications and morbidity.  Serious etiology was considered. Ddx includes but is not limited to: +ppd, allergic reaction, localized discomfort from test, etc  Complete initial physical exam performed, notably the patient was in no distress, asymptomatic.    Reviewed and confirmed nursing documentation for past medical history, family history, social history.  Vital signs reviewed.      Brief summary: She is asymptomatic.  She had possible reaction to PPD testing earlier today.  There is no visible abnormal reaction on her forearm at the location of the PPD testing.  She felt it was itchy initially but has since resolved. Will order chest x-ray per requirements of SNF.  Her x-ray is not revealing for evidence of pneumonia, no evidence of tuberculosis.  On recheck of her forearm I do not see given evidence of a positive PPD test.  She remains asymptomatic  Detailed discussions were had with the patient/guardian regarding current findings, and need for close f/u with PCP or on call doctor. The patient/guardian has been instructed to return immediately if the symptoms worsen in any way for re-evaluation. Patient/guardian verbalized understanding and is in agreement with current care plan. All questions answered prior to discharge.                Additional history obtained: -Additional history obtained from na -External records from outside source obtained and reviewed including: Chart review including previous notes, labs, imaging, consultation notes including  Prior ED documentation, prior labs and imaging   Lab Tests: NA  EKG   EKG Interpretation Date/Time:    Ventricular Rate:    PR Interval:    QRS Duration:    QT Interval:    QTC Calculation:   R Axis:       Text Interpretation:           Imaging Studies ordered: I ordered imaging studies including CXR I independently visualized the following imaging with scope of interpretation limited to determining acute life threatening conditions related to emergency care; findings noted above I independently visualized and interpreted imaging. I agree with the radiologist interpretation   Medicines ordered and prescription drug management: Meds ordered this encounter  Medications   acetaminophen (TYLENOL) tablet 1,000 mg    -I have reviewed the patients home medicines and have made adjustments as needed   Consultations Obtained: na   Cardiac Monitoring: Continuous pulse oximetry interpreted by myself, 97% on RA.    Social Determinants of Health:  Diagnosis or treatment significantly limited by social determinants of health: na   Reevaluation: After the interventions noted above, I reevaluated the patient and found that they have stayed the same  Co morbidities that complicate the patient evaluation  Past Medical History:  Diagnosis Date   Abnormal pap    w LEEP   Alcoholism (HCC)    Allergy    Anxiety    Asthma    Asthma    Blood in stool    Depression    Depression (emotion)    Essential hypertension 01/29/2023   Frequent headaches    Hepatitis C, chronic (HCC)    hep c tx successful   Irritable bowel syndrome    OSA on CPAP - sees Dr. Jenne Campus in ENT 10/09/2013   Seasonal  allergies    scratch test in 2006///mold issue in house      Dispostion: Disposition decision including need for hospitalization was considered, and patient discharged from emergency department.    Final Clinical Impression(s) / ED Diagnoses Final diagnoses:  Encounter for routine chest x-ray        Sloan Leiter, DO 07/19/23 2055

## 2023-07-19 NOTE — Progress Notes (Signed)
 Speech Language Pathology Discharge Summary  Patient Details  Name: Nicole [name] MRN: [id number] Date of Birth: 1976/05/06  Date of Discharge from SLP service:July 19, 2023  Patient has met 4 of 5 long term goals.  Patient to discharge at overall Supervision;Min;Modified Independent (modI for communication and swallowing, supervision-min for basic cognition) level.  Reasons goals not met: severity of impairments   Clinical Impression/Discharge Summary: Patient has made incredible progress during CIR admission, meeting 4/5 long term goals set for duration. Patient currently tolerates regular/thin liquid diet with modified independence, communicates verbally across environments with 100% speech intelligibility with modified independence and completes all basic cognitive tasks with supervision-min assist. Patient would benefit from skilled therapy services at next venue of care to target lingering deficits. Patient and family education complete. SLP will sign off.    Care Partner:  Caregiver Able to Provide Assistance: No  Type of Caregiver Assistance: Cognitive  Recommendation:  Skilled Nursing facility;24 hour supervision/assistance  Rationale for SLP Follow Up: Maximize cognitive function and independence   Equipment: n/a   Reasons for discharge: Discharged from hospital   Patient/Family Agrees with Progress Made and Goals Achieved: Yes   [name], M.A., CCC-SLP  [name] 07/19/2023, 4:18 PM

## 2023-07-19 NOTE — Progress Notes (Signed)
 Physical Therapy Session Note  Patient Details  Name: Nicole [name] MRN: [id number] Date of Birth: February 21, 1976  Today's Date: 07/19/2023 PT Individual Time: 1001-1030 PT Individual Time Calculation (min): 29 min   Short Term Goals: Week 1:  PT Short Term Goal 1 (Week 1): Pt will perform supine<>sit with supervision PT Short Term Goal 1 - Progress (Week 1): Other (comment) (new goal) PT Short Term Goal 2 (Week 1): Pt will ambulate at least 138ft with no more than min A, not using Nicole AD. PT Short Term Goal 2 - Progress (Week 1): Other (comment) (new goal) PT Short Term Goal 3 (Week 1): Pt will demonstrate decreased fall risk as noted by Nicole improvement of at least 7 points on the [employer] Test PT Short Term Goal 3 - Progress (Week 1): Other (comment) (new goal) PT Short Term Goal 4 (Week 1): Pt will navigate 4 steps using HRs with no more than CGA  Skilled Therapeutic Interventions/Progress Updates:      Direct handoff of care from OT with patient sitting in wheelchair. Patient has no complaints of pain. She's aware of DC plan and feels excited about a new opportunity. Reviewed safety precautions, recommendation for RW at all times with mobility, wheelchair safety, and general fall prevention . Pt requesting to "play basketball" so she was taken to rehab gym and participated in therapeutic activity of standing basketball toss with min guard, practiced picking up ball from ground level at minA to prevent anterior LOB, as well as practiced "dribbling" in the hallway with min guard. Patient has difficulty pacing herself and monitoring her safety when she becomes fatigued. Continued education on energy conservation , fall prevention, and general safety awareness. Ended treatment in wheelchair with her needs met.    Therapy Documentation Precautions:  Precautions Precautions: Fall Precaution/Restrictions Comments: decreased stand balance- reliant on UE, Pain in LLE limits mobility at  times Required Braces or Orthoses: Other Brace Other Brace: B adjustable night splints; B elbow "cosey" splints; L palm protector; R mitt Restrictions Weight Bearing Restrictions Per Provider Order: No  Therapy/Group: Individual Therapy  Nicole [name] 07/19/2023, 10:26 AM
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
## 2023-07-19 NOTE — Discharge Instructions (Addendum)
 Your chest x-ray does not show any evidence of tuberculosis  It was a pleasure caring for you today in the emergency department.  Please return to the emergency department for any worsening or worrisome symptoms.

## 2023-07-19 NOTE — Plan of Care (Signed)
  Problem: Consults Goal: RH STROKE PATIENT EDUCATION Description: See Patient Education module for education specifics  Outcome: Progressing   Problem: RH BOWEL ELIMINATION Goal: RH STG MANAGE BOWEL WITH ASSISTANCE Description: STG Manage Bowel with mod I Assistance. Outcome: Progressing Goal: RH STG MANAGE BOWEL W/MEDICATION W/ASSISTANCE Description: STG Manage Bowel with Medication with mod I Assistance. Outcome: Progressing   Problem: RH BLADDER ELIMINATION Goal: RH STG MANAGE BLADDER WITH ASSISTANCE Description: STG Manage Bladder With toileting Assistance Outcome: Progressing   Problem: RH SKIN INTEGRITY Goal: RH STG MAINTAIN SKIN INTEGRITY WITH ASSISTANCE Description: STG Maintain Skin Integrity With min  Assistance. Outcome: Progressing

## 2023-07-19 NOTE — ED Provider Triage Note (Signed)
 Emergency Medicine Provider Triage Evaluation Note  Nicole [name] , a 48 y.o. female  was evaluated in triage.  Pt complains of abnormal PPD.  Patient reports that she had PPD earlier today, reports it is itchy and appears swollen and was sent to the ED for chest x-ray.  Denies reaction to PPD in the past.  No prior tuberculosis vaccine or travel to endemic country.  She denies any acute complaints otherwise  Review of Systems  Positive: Itchy forearm Negative: Cough, congestion, fever, chills, weight changes  Physical Exam  BP 114/88   Pulse 95   Temp 98.5 F (36.9 C) (Oral)   Resp 16   Ht 5' (1.524 m)   Wt 62.1 kg   SpO2 96%   BMI 26.76 kg/m  Gen:   Awake, no distress   Resp:  Normal effort  MSK:   Moves extremities without difficulty  Other:  No obvious induration or skin abnormality consistent with positive PPD testing  Medical Decision Making  Medically screening exam initiated at 5:29 PM.  Appropriate orders placed.  Nicole [name] was informed that the remainder of the evaluation will be completed by another provider, this initial triage assessment does not replace that evaluation, and the importance of remaining in the ED until their evaluation is complete.  Patient was sent to the ED after possible positive PPD testing.  Reports her arm is itchy after she had a PPD test.  This is since resolved.  Skin appears normal.  Will get chest x-ray PER SNF requirement.   Nicole Leiter, DO 07/19/23 1731

## 2023-07-19 NOTE — ED Triage Notes (Signed)
 Pt to ED via [facility] from [facility] requesting a chest xray. Apparently pt had a reaction to the ppd test and is now in need of a chest xray. Pt voicing no complaints.   Last VS:  120/80, p 88. 98%RA

## 2023-07-19 NOTE — Plan of Care (Signed)
  Problem: RH Problem Solving Goal: LTG Patient will demonstrate problem solving for (SLP) Description: LTG:  Patient will demonstrate problem solving for basic/complex daily situations with cues  (SLP) Outcome: Not Met (add Reason)   Problem: RH Cognition - SLP Goal: RH LTG Patient will demonstrate orientation with cues Description:  LTG:  Patient will demonstrate orientation to person/place/time/situation with cues (SLP)   Outcome: Completed/Met   Problem: RH Expression Communication Goal: LTG Patient will increase speech intelligibility (SLP) Description: LTG: Patient will increase speech intelligibility at word/phrase/conversation level with cues, % of the time (SLP) Outcome: Completed/Met   Problem: RH Attention Goal: LTG Patient will demonstrate this level of attention during functional activites (SLP) Description: LTG:  Patient will will demonstrate this level of attention during functional activites (SLP) Outcome: Completed/Met   Problem: RH Memory Goal: LTG Patient will demonstrate ability for day to day (SLP) Description: LTG:   Patient will demonstrate ability for day to day recall/carryover during cognitive/linguistic activities with assist  (SLP) Outcome: Completed/Met

## 2023-08-06 ENCOUNTER — Encounter: Payer: Self-pay | Admitting: Physical Medicine and Rehabilitation

## 2023-08-06 ENCOUNTER — Encounter: Payer: MEDICAID | Attending: Physical Medicine and Rehabilitation | Admitting: Physical Medicine and Rehabilitation

## 2023-08-06 VITALS — BP 97/71 | HR 93 | Ht 60.0 in | Wt 134.0 lb

## 2023-08-06 DIAGNOSIS — E663 Overweight: Secondary | ICD-10-CM | POA: Diagnosis present

## 2023-08-06 DIAGNOSIS — R45851 Suicidal ideations: Secondary | ICD-10-CM | POA: Insufficient documentation

## 2023-08-06 DIAGNOSIS — M545 Low back pain, unspecified: Secondary | ICD-10-CM | POA: Insufficient documentation

## 2023-08-06 DIAGNOSIS — R4189 Other symptoms and signs involving cognitive functions and awareness: Secondary | ICD-10-CM | POA: Diagnosis present

## 2023-08-06 DIAGNOSIS — R339 Retention of urine, unspecified: Secondary | ICD-10-CM | POA: Diagnosis present

## 2023-08-06 DIAGNOSIS — G43809 Other migraine, not intractable, without status migrainosus: Secondary | ICD-10-CM | POA: Diagnosis present

## 2023-08-06 NOTE — Patient Instructions (Signed)
 Foods that may reduce pain: 1) Ginger (especially studied for arthritis)- reduce leukotriene production to decrease inflammation 2) Blueberries- high in phytonutrients that decrease inflammation 3) Salmon- marine omega-3s reduce joint swelling and pain 4) Pumpkin seeds- reduce inflammation 5) dark chocolate- reduces inflammation 6) turmeric- reduces inflammation 7) tart cherries - reduce pain and stiffness 8) extra virgin olive oil - its compound olecanthal helps to block prostaglandins  9) chili peppers- can be eaten or applied topically via capsaicin 10) mint- helpful for headache, muscle aches, joint pain, and itching 11) garlic- reduces inflammation 12) Green tea- reduces inflammation and oxidative stress, helps with weight loss, may reduce the risk of cancer, recommend Double Green Matcha [place] of Tea daily  Link to further information on diet for chronic pain: http://www.bray.com/   Turmeric to reduce inflammation--can be used in cooking or taken as a supplement.  Benefits of turmeric:  -Highly anti-inflammatory  -Increases antioxidants  -Improves memory, attention, brain disease  -Lowers risk of heart disease  -May help prevent cancer  -Decreases pain  -Alleviates depression  -Delays aging and decreases risk of chronic disease  -Consume with black pepper to increase absorption    Turmeric Milk Recipe:  1 cup milk  1 tsp turmeric  1 tsp cinnamon  1 tsp grated ginger (optional)  Black pepper (boosts the anti-inflammatory properties of turmeric).  1 tsp honey   Anxiety: -Discussed exercise and meditation as tools to decrease anxiety. -Recommended Down Dog Yoga app -Discussed spending time outdoors. -Discussed positive re-framing of anxiety.  -Discussed the following foods that have been show to reduce anxiety: 1) [place] nuts, mushrooms, soy beans due to their high selenium  content. Upper limit of toxicity of selenium is 461mcg/day so no more than 3-4 [place] nuts per day.  2) Fatty fish such as salmon, mackerel, sardines, trout, and herring- high in omega-3 fatty acids 3) Eggs- increases serotonin and dopamine 4) Pumpkin seeds- high in omega-3 fatty acids 5) dark chocolate- high in flavanols that increase blood flow to brain 6) turmeric- take with black pepper to increase absorption 7) chamomile tea- antioxidant and anti-inflammatory properties 8) yogurt without sugar- supports gut-brain axis 9) green tea- contains L- theanine 10) blueberries- high in vitamin C and antioxidants 11) [place]- high in tryptophan which gets converted to serotonin 12) bell peppers- rich in vitamin C and antioxidants 13) citrus fruits- rich in vitamin C and antioxidants 14) almonds- high in vitamin E and healthy fats 15) chia seeds- high in omega-3 fatty acids -Made goal to ____

## 2023-08-06 NOTE — Progress Notes (Signed)
 Subjective:    Patient ID: Nicole [name], female    DOB: [birth year], 48 y.o.   MRN: [id number]  HPI Nicole [name] is a 48 year old woman who presents for follow-up of anoxic brain injury  1) Anoxic brain injury: -she is doing well but is not as happy with the care at [facility] -she receives PT and OT once per week -she does have time outside  2) Overweight: -she is concerned about her weight gain -mom says she was 107 when she entered the hospital and is now 134 lbs  3) Low back pain: -she is getting benefit from the tramadol and lidocaine patch  Pain Inventory Average Pain 7 Pain Right Now 7 My pain is constant, dull, stabbing, tingling, and aching, right ankle pain  LOCATION OF PAIN  HEAD, BACK  BOWEL Number of stools per week: 2-3 Oral laxative use No  Type of laxative NONE Enema or suppository use No  History of colostomy No  Incontinent Yes   BLADDER Pads  Bladder incontinence Yes     Mobility walk with assistance use a walker ability to climb steps?  yes do you drive?  no Do you have any goals in this area?  yes  Function disabled: date disabled Applied  I need assistance with the following:  meal prep, household duties, shopping, and Currently at [employer] Do you have any goals in this area?  yes  Neuro/Psych bladder control problems bowel control problems trouble walking spasms confusion depression anxiety suicidal thoughts  Prior Studies Any changes since last visit?  no  Physicians involved in your care Any changes since last visit?  no   Family History  Problem Relation Age of Onset   Leukemia Father    Emphysema Mother    Colon polyps Mother    Diverticulitis Mother    Heart disease Father    Cancer Father        unsure origin; had spot on lung   Hypertension Father    Hepatitis C Father    Alcohol abuse Father    Drug abuse Father    Stroke Maternal Grandmother    Osteoporosis Maternal Grandmother    Alcohol abuse Maternal  Grandfather    Liver disease Maternal Grandfather    Migraines Neg Hx    Social History   Socioeconomic History   Marital status: Single    Spouse name: Not on file   Number of children: 0   Years of education: Not on file   Highest education level: Not on file  Occupational History   Occupation: Adult nurse: [employer]  Tobacco Use   Smoking status: Never   Smokeless tobacco: Never  Vaping Use   Vaping status: Never Used  Substance and Sexual Activity   Alcohol use: Yes    Comment: occassional use   Drug use: Yes    Types: "Crack" cocaine    Comment: crack   Sexual activity: Yes    Partners: Male    Birth control/protection: Pill    Comment: First sexual intercourse at age 48,  more than 5 sexual partners   Other Topics Concern   Not on file  Social History Narrative   ** Merged History Encounter **       Work or School: Glass blower/designer Situation: lives with husband      Spiritual Beliefs: none      Lifestyle: no regular exercise; diet is  fair               Social Drivers of Corporate investment banker Strain: Not on file  Food Insecurity: Food Insecurity Present (11/13/2022)   Hunger Vital Sign    Worried About Running Out of Food in the Last Year: Often true    Ran Out of Food in the Last Year: Often true  Transportation Needs: Unmet Transportation Needs (11/13/2022)   PRAPARE - Administrator, Civil Service (Medical): Yes    Lack of Transportation (Non-Medical): Yes  Physical Activity: Not on file  Stress: Not on file  Social Connections: Unknown (07/15/2022)   Received from [facility]   Social Network    Social Network: Not on file   Past Surgical History:  Procedure Laterality Date   DILATION AND CURETTAGE OF UTERUS  1996, 2001, 2002   x 3   LEEP  2008   NASAL FRACTURE SURGERY  1999   repair   NASAL SEPTOPLASTY W/ TURBINOPLASTY     TONSILLECTOMY     Past Medical History:   Diagnosis Date   Abnormal pap    w LEEP   Alcoholism (HCC)    Allergy    Anxiety    Asthma    Asthma    Blood in stool    Depression    Depression (emotion)    Essential hypertension 01/29/2023   Frequent headaches    Hepatitis C, chronic (HCC)    hep c tx successful   Irritable bowel syndrome    OSA on CPAP - sees Dr. Jenne Campus in ENT 10/09/2013   Seasonal allergies    scratch test in 2006///mold issue in house   BP 97/71   Pulse 93   Ht 5' (1.524 m)   Wt 134 lb (60.8 kg)   SpO2 98%   BMI 26.17 kg/m   Opioid Risk Score:   Fall Risk Score:  `1  Depression screen [facility] 2/9     08/13/2020    8:46 AM 08/06/2019    9:02 AM 05/07/2019    8:15 AM 06/25/2018    9:38 AM 06/19/2017    8:54 AM 04/10/2017    1:33 PM 05/14/2015    2:00 PM  Depression screen PHQ 2/9  Decreased Interest 0 0 0 0 0 0 0  Down, Depressed, Hopeless 0 0 0 0 0 0 0  PHQ - 2 Score 0 0 0 0 0 0 0  Altered sleeping 2 1 1 1 1 1    Tired, decreased energy 2 1 1 1 1 1    Change in appetite 0 0 0 0 0 0   Feeling bad or failure about yourself  0 0 0 0 0 0   Trouble concentrating 0 0 0 0 0 0   Moving slowly or fidgety/restless 0 0 0 0 0 0   Suicidal thoughts 0 0 0 0 0    PHQ-9 Score 4 2 2 2 2 2    Difficult doing work/chores Not difficult at all          Review of Systems  Gastrointestinal:  Positive for constipation.       Incontinence  Genitourinary:        Incontinence   Musculoskeletal:  Positive for gait problem.       Spasms  Neurological:  Positive for weakness.  Psychiatric/Behavioral:  Positive for confusion and suicidal ideas.        Depression, anxiety  All other systems reviewed and  are negative.      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  -Discussed benefits of exercise in reducing pain. -Discussed following foods that may reduce pain: 1) Ginger (especially studied for arthritis)- reduce leukotriene production to decrease inflammation 2) Blueberries- high in phytonutrients that decrease inflammation 3) Salmon- marine omega-3s reduce joint swelling and pain 4) Pumpkin seeds- reduce inflammation 5) dark chocolate- reduces inflammation 6) turmeric- reduces inflammation 7) tart cherries - reduce pain and stiffness 8) extra virgin olive oil - its compound olecanthal helps to block prostaglandins  9) chili peppers- can be eaten or applied topically via capsaicin 10) mint- helpful for headache, muscle aches, joint pain, and itching 11) garlic- reduces inflammation 12) Green tea- reduces inflammation and oxidative stress, helps with weight loss, may reduce the risk of cancer, recommend Double Green Matcha [place] of Tea daily  Link to further information on diet for chronic pain: http://www.bray.com/   Turmeric to reduce inflammation--can be used in cooking or taken as a supplement.  Benefits of turmeric:  -Highly anti-inflammatory  -Increases antioxidants  -Improves memory, attention, brain disease  -Lowers risk of heart disease  -May help prevent cancer  -Decreases pain  -Alleviates depression  -Delays aging and decreases risk of chronic disease  -Consume with black pepper to increase absorption    Turmeric Milk Recipe:  1 cup milk  1 tsp turmeric  1 tsp cinnamon  1 tsp grated ginger (optional)  Black pepper (boosts the anti-inflammatory properties of turmeric).  1 tsp honey    2) Overweight: start topamax HS  3)  Suicidal ideation: -start abilify 2mg  daily which she had benefit from in the past -referred to psychiatry  4) Anoxic bran injury: -increase PT/OT to 1 hour daily at least 6 days per week -restart SLP  5) Urinary retention:  Referred to urology  6) Migraines: -start topamax 25mg  HS

## 2023-08-22 DIAGNOSIS — L710 Perioral dermatitis: Secondary | ICD-10-CM | POA: Insufficient documentation

## 2023-08-23 ENCOUNTER — Encounter: Payer: Self-pay | Admitting: Neurology

## 2023-08-23 ENCOUNTER — Ambulatory Visit: Payer: MEDICAID | Admitting: Neurology

## 2023-08-23 VITALS — BP 104/73 | HR 83 | Ht 60.0 in | Wt 136.4 lb

## 2023-08-23 DIAGNOSIS — R519 Headache, unspecified: Secondary | ICD-10-CM

## 2023-08-23 DIAGNOSIS — Z8669 Personal history of other diseases of the nervous system and sense organs: Secondary | ICD-10-CM

## 2023-08-23 DIAGNOSIS — R634 Abnormal weight loss: Secondary | ICD-10-CM

## 2023-08-23 DIAGNOSIS — G931 Anoxic brain damage, not elsewhere classified: Secondary | ICD-10-CM | POA: Diagnosis not present

## 2023-08-23 NOTE — Patient Instructions (Signed)
 It was nice to see you again.  I am glad you have made progress and have come along way.  We will call you to schedule your home sleep test.  We will consider AutoPap therapy again if the need arises if you still have sleep apnea.  Please increase your water intake to about 6 to 8 cups of water per day and limit your caffeine to 1 or 2 cups of coffee per day, 8 ounce size each.

## 2023-08-23 NOTE — Progress Notes (Signed)
 Subjective:    Patient ID: Nicole [name] is a 48 y.o. female.  HPI    Huston Foley, MD, PhD [facility] [address] [address] [place], [place] [address]  Dear Nicole [name],   I saw your patient, Nicole [name], upon your kind request in my neurologic clinic today for evaluation of her history of hypoxic brain injury.  The patient is accompanied by her mother today.  As you know, 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  She is currently at [employer]  Mom reports that she has fallen a few times, typically when she has not used her walker, she uses a 2 wheeled walker.  She takes Topamax at bedtime and is on tramadol as needed.  She tries to hydrate well, estimates that she drinks about 3 to 4 cups of water per day and 2 to 3 cups of coffee in the morning. She does not sleep very well.  She still snores.  She would like to get tested with a home sleep test again.  Of note, she was hospitalized for several months after being found down.  She was in inpatient rehab from 11/18/2022 through 07/19/2023.  She was admitted to the hospital in June 2024 after being found down.  She had a guarded prognosis, she was transferred to rehab.  She had hypoxic respiratory failure, severe sepsis, hypernatremia, severe acute kidney injury, and widespread cerebral edema as well as rhabdomyolysis and elevated liver enzymes.  She had a pressure ulcer on her gluteal region.  I reviewed the rehab inpatient records as well as the inpatient hospital records.  She had extensive workup, MRI of the brain showed findings in keeping with toxic metabolic injuries and watershed distribution infarcts.  She had  small bifrontal subarachnoid hemorrhages.  She had workup with EEG which was negative for seizures.  Neurosurgery was following secondary to toxic cerebral edema.    We have followed her in our neurology clinic for migraine headaches and sleep apnea in the past, last appointment was in December 2023, she was not on PAP therapy at the time, she reported significant weight loss of over 100 pounds at the time.  She did not proceed with sleep testing in 2023 as planned, last home sleep test in 2021 showed an AHI of 35.7/h, O2 nadir 92%.    Previously:  04/27/22 (Amy Lomax, NP): << [name] returns for follow up for migraines and OSA on CPAP. She was last seen by Dr Frances Furbish 07/2021. HST repeated due to weight loss and topiramate increased to 75mg  QHS. Nurtec continued for abortive therapy. HST has not been performed. She reports not having a consistent sleep schedule and could not afford test. Since, she has not used her CPAP. She reports losing over 100lbs. She contributes this to extreme stress.    Her main concern, today, is that she is having significant difficulty staying awake during the day. She reports that she is not able to stay awake. Her mother reports that she seems to be drugged at times. She reports being sober since 2008. No alcohol or street drugs. She admits that sleep schedules are inconsistent but typically tries to go to bed around 9:30-10p and wakes around 8. She reports some times she  is able to function normally during the day and others she can not keep herself awake. Her mother reports an episode last week where she had to leave church early because Nicole [name] felt that she could not stay awake while playing hand bells.    She tells me she is under significant amounts of stress. She is being evicted in 8 days. She is getting a divorce. She reports hackers are preventing her from finding employment as they are trying to steal her identity. She reports being fired from a recent job due to [employer] slowing her internet speeds to delay her ability to complete work. She reports hackers also accessed her employers database. She states that she had proof of this through chat sessions with hackers but information was deleted from her external hard drive when she connected to her computer. She reports getting fired, most recently, due to not waking up in time to get to work. She had not set her alarm nor set her phone to wake her up. Her mother woke her up by pounding on the front door.    She is followed by psychiatry and has follow up next week. She continues Prozac and Abilify. She denies SI.  >>  05/19/2019: 48 year old right-handed woman with an underlying medical history of hepatitis, status post treatment, irritable bowel syndrome, sleep apnea, on CPAP therapy, seasonal allergies, asthma, depression, alcoholism (by chart review and self-admission), former history of IV drug use (by self admission, none since 2007), and morbid obesity, who was previously diagnosed with obstructive sleep apnea and placed on CPAP therapy.  Prior sleep study results are not available  for my review today.  Her Epworth sleepiness score is 6 out of 24 today.  She is compliant with her current AutoPap machine which is S9 AutoSet.  In the past 90 days she has used her machine is days greater than 4 hours at 97%, indicating excellent compliance, average usage of 8 hours and 41 minutes, dates: 02/19/2019 through 05/19/2019.  Average AHI at goal at 3.6/h, average 95th percentile of pressure at 10.5 cm with a Default range of 4 to 20 cm, leak consistently high with a 95th percentile at 56.3 L/min.  She reports that she is a side sleeper.  Her mask is a Quatro full facemask size small.  She indicates full compliance with her AutoPap machine but she has seen an error message from the machine indicating problems with the motor.  She is doing a little better as far as her headaches, she has been using Flexeril nearly nightly.  She  has tried [place] once for a migraine that started in the evening.  She had no side effects and the next day she had no residual migrainous symptoms.  Her bedtime is generally between 930 and 10 PM and rise time around 8.  She suspects that her father had sleep apnea, he passed away at 53.  She denies night to night nocturia.   She had a brain MRI with and without contrast on 05/07/2019 and I reviewed the results: IMPRESSION:    Normal MRI brain (with and without).       04/28/2019: (She) reports recurrent headaches/migraines for the past several years.  She presented to the emergency room on 04/09/2019 with headaches, she was treated symptomatically with IV fluids, Toradol, Compazine, Benadryl, and Decadron.  She reported throat tightness and chest tightness after taking Maxalt.  She had a similar response to Imitrex in the past.  I reviewed the emergency  room records.  She was referred to [facility] last year but never actually had an appointment, canceled twice, from what I can see.  She has not had a brain MRI.  She had a head CT in 2000 which was negative for any acute findings.  She is not aware of any family history of migraines but also reports that she does not know her full family history.  She has been referred to pulmonary sleep medicine for her sleep apnea but has not had a visit yet.  She has an older CPAP machine.  Her weight has been fluctuating, lately she has gained.  She has been working from home since the pandemic, she works as a Radiographer, therapeutic.  She quit smoking in 2007, she tries to hydrate well with water.  She reports a migraine about once a month, she can tell by her visual aura.  She has associated photophobia and sonophobia and nausea, typically no vomiting.  She tried the Maxalt once but got scared after having the chest tightness and throat tightness.  She was advised not to continue to take the Fioricet for fear of rebound headaches when she went to the  ER.  She has not found the Fioricet helpful.  She was also discouraged to use ibuprofen by her primary care physician.  She has other intermittent headaches that started in the back of her head and radiate forward to the temples.  She can tell the difference between those 2 headaches as the migraine headache is often one-sided and throbbing associated with the light sensitivity and nausea and preceded by the aura.  She has noted dehydration as a trigger and tries to hydrate well and also utilizes Pedialyte up to twice a week.  She lives with her husband, they have no children, no pets in the household and she limits her caffeine to 2 cups of coffee per day.  She tries to keep a set sleep schedule and tries to achieve about 8 hours of sleep.  She had an eye examination this year in April and her prescription eyeglasses are up-to-date, her prescription tends to change with every visit.    Her Past Medical History Is Significant For: Past Medical History:  Diagnosis Date   Abnormal pap    w LEEP   Alcoholism (HCC)    Allergy    Anxiety    Asthma    Asthma    Blood in stool    Depression    Depression (emotion)    Essential hypertension 01/29/2023   Frequent headaches    Hepatitis C, chronic (HCC)    hep c tx successful   Irritable bowel syndrome    OSA on CPAP - sees Dr. Jenne Campus in ENT 10/09/2013   Seasonal allergies    scratch test in 2006///mold issue in house    Her Past Surgical History Is Significant For: Past Surgical History:  Procedure Laterality Date   DILATION AND CURETTAGE OF UTERUS  1996, 2001, 2002   x 3   LEEP  2008   NASAL FRACTURE SURGERY  1999   repair   NASAL SEPTOPLASTY W/ TURBINOPLASTY     TONSILLECTOMY      Her Family History Is Significant For: Family History  Problem Relation Age of Onset   Leukemia Father    Emphysema Mother    Colon polyps Mother    Diverticulitis Mother    Heart disease Father    Cancer Father  unsure origin; had spot on lung    Hypertension Father    Hepatitis C Father    Alcohol abuse Father    Drug abuse Father    Stroke Maternal Grandmother    Osteoporosis Maternal Grandmother    Alcohol abuse Maternal Grandfather    Liver disease Maternal Grandfather    Migraines Neg Hx     Her Social History Is Significant For: Social History   Socioeconomic History   Marital status: Single    Spouse name: Not on file   Number of children: 0   Years of education: Not on file   Highest education level: Not on file  Occupational History   Occupation: Adult nurse: [employer]  Tobacco Use   Smoking status: Never   Smokeless tobacco: Never  Vaping Use   Vaping status: Never Used  Substance and Sexual Activity   Alcohol use: Yes    Comment: occassional use   Drug use: Yes    Types: "Crack" cocaine    Comment: crack   Sexual activity: Yes    Partners: Male    Birth control/protection: Pill    Comment: First sexual intercourse at age 33,  more than 5 sexual partners   Other Topics Concern   Not on file  Social History Narrative   ** Merged History Encounter **       Work or School: Glass blower/designer Situation: lives with husband      Spiritual Beliefs: none      Lifestyle: no regular exercise; diet is fair               Social Drivers of Corporate investment banker Strain: Not on file  Food Insecurity: Food Insecurity Present (11/13/2022)   Hunger Vital Sign    Worried About Running Out of Food in the Last Year: Often true    Ran Out of Food in the Last Year: Often true  Transportation Needs: Unmet Transportation Needs (11/13/2022)   PRAPARE - Administrator, Civil Service (Medical): Yes    Lack of Transportation (Non-Medical): Yes  Physical Activity: Not on file  Stress: Not on file  Social Connections: Unknown (07/15/2022)   [facility]   Social Network    Social Network: Not on file    Her Allergies Are:   Allergies  Allergen Reactions   [place] Nut (Berthollefia Excelsa) Anaphylaxis and Swelling   Cashew Nut (Anacardium Occidentale) Skin Test Anaphylaxis   Cashew Nut Oil Anaphylaxis    Other Reaction(s): Unknown   Triptans Anaphylaxis   Lexapro [Escitalopram Oxalate] Other (See Comments)    Mania    Ceclor [Cefaclor] Itching and Rash   Nickel Rash  :   Her Current Medications Are:  Outpatient Encounter Medications as of 08/23/2023  Medication Sig   acetaminophen (TYLENOL) 325 MG tablet Take 2 tablets (650 mg total) by mouth every 6 (six) hours as needed for mild pain, moderate pain or fever.   ARIPiprazole (ABILIFY) 2 MG tablet Take 2 mg by mouth daily.   bisacodyl (FLEET) 10 MG/30ML ENEM Place 10 mg rectally once.   cholecalciferol (CHOLECALCIFEROL) 25 MCG tablet Take 4 tablets (4,000 Units total) by mouth daily.   levalbuterol (XOPENEX HFA) 45 MCG/ACT inhaler Inhale 1-2 puffs into the lungs every 6 (six) hours as needed for wheezing or shortness of breath.   levonorgestrel (MIRENA) 20 MCG/DAY IUD by Intrauterine route.  lidocaine (LIDODERM) 5 % Place 2 patches onto the skin daily. Remove & Discard patch within 12 hours or as directed by MD   magnesium gluconate (MAGONATE) 500 MG tablet Take 0.5 tablets (250 mg total) by mouth at bedtime.   magnesium hydroxide ([employer] MILK OF MAGNESIA) 400 MG/5ML suspension Take 30 mLs by mouth daily as needed for mild constipation.   Melatonin 3 MG CAPS Take by mouth.   mirabegron ER (MYRBETRIQ) 25 MG TB24 tablet Take 1 tablet (25 mg total) by mouth daily.   Multiple Vitamin (MULTIVITAMINS PO) Take 1 tablet by mouth daily.    prazosin (MINIPRESS) 1 MG capsule Take 1 capsule (1 mg total) by mouth at bedtime.   topiramate (TOPAMAX) 25 MG capsule Take 25 mg by mouth 2 (two) times daily.   traMADol (ULTRAM) 50 MG tablet Take 1 tablet (50 mg total) by mouth every 12 (twelve) hours as needed for severe pain (pain score 7-10).   FLUoxetine (PROZAC) 20 MG  capsule Take by mouth. (Patient not taking: Reported on 08/23/2023)   promethazine-dextromethorphan (PROMETHAZINE-DM) 6.25-15 MG/5ML syrup Take by mouth. (Patient not taking: Reported on 08/23/2023)   No facility-administered encounter medications on file as of 08/23/2023.  :   Review of Systems:  Out of a complete 14 point review of systems, all are reviewed and negative with the exception of these symptoms as listed below:   Review of Systems  Neurological:        Room 5 Pt is here with her Mother. [name] states pt has migraines just about everyday. [name] states that her migraines last a couple of hours per day. Pt states that when she has her migraines she has pressure in her head. Pt states that she has some nausea and vomiting. Pt states that she had a migraine this morning that mad her very nauseous. Pt states that she has light and sound sensitivity with her migraines. Pt states she would like to do an At Home Sleep Study. Pt states she snores all night, and she loses her breathe at night.     Objective:  Neurological Exam  Physical Exam Physical Examination:   Vitals:   08/23/23 1140  BP: 104/73  Pulse: 83   General Examination: The patient is in no acute distress, minimally verbal.   HEENT: Normocephalic, atraumatic, pupils are equal, round and reactive to light, corrective eyeglasses in place, tracking is preserved, hearing grossly intact, face is symmetric, speech is scant, not dysarthric.  She she speaks in very few sentences only.   Chest: Clear to auscultation without wheezing, rhonchi or crackles noted.   Heart: S1+S2+0, regular and normal without murmurs, rubs or gallops noted.    Abdomen: Soft, non-tender and non-distended.   Extremities: There is no pitting edema in the distal lower extremities bilaterally.   Skin: Warm and dry without trophic changes noted.   Musculoskeletal: exam reveals no obvious joint deformities.    Neurologically:  Mental  status: The patient is awake, alert and cooperative with the exam.  Provides minimal history and response with 1 or 2 word sentences only most of the time.  Cranial nerves II - XII are as described above under HEENT exam.  Motor exam: Thin bulk, global strength of about 4 out of 5, no increase in tone, reflexes about 1+ throughout, fine motor skills globally mildly impaired.   coordination: grossly intact.  Cerebellar testing: No dysmetria or intention tremor. There is no truncal or gait ataxia.  Sensory exam: intact to  light touch in the upper and lower extremities.  Gait, station and balance: She stands without difficulty, she uses a 2 wheeled walker, turns a little quickly but maneuvers her walker well otherwise.     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  She has had a prolonged hospitalization and very prolonged rehab inpatient and is currently in a skilled nursing facility for ongoing rehab.  She is currently on low-dose topiramate for her headaches, does not sleep very well, is advised to increase her water intake limit her caffeine intake.  We talked about the importance of fall prevention.  She is advised to use her walker at all times.  This was an extended visit of over 1 hour of high complexity with copious record reviewed in her electronic chart.  She had a very complicated course and guarded prognosis when she was hospitalized, she has come along way.  We will go ahead and look at her sleep apnea again, she was previously diagnosed with obstructive sleep apnea and had a AutoPap machine, it may be in storage at this time.  She would be willing to restart treatment if need be but she also  had significant weight loss in the interim.  I will order a home sleep test.  We will plan a follow-up accordingly.  They are advised to continue with the current management.  We talked about headache triggers and the importance of trying to get enough rest at night.  I answered all the questions today and the patient and her mom were in agreement.

## 2023-09-03 ENCOUNTER — Encounter: Payer: Self-pay | Admitting: Physical Medicine and Rehabilitation

## 2023-09-03 ENCOUNTER — Telehealth: Payer: Self-pay

## 2023-09-03 NOTE — Telephone Encounter (Signed)
 Felipa Horsfall mother of patient needs a written letter stating Nicole [name]'s current status, stating her disability and residence status so mom can get her car title transferred. Felipa Horsfall said you can call her for more details.

## 2023-09-18 ENCOUNTER — Encounter (HOSPITAL_COMMUNITY): Payer: Self-pay | Admitting: Student

## 2023-09-18 ENCOUNTER — Ambulatory Visit (INDEPENDENT_AMBULATORY_CARE_PROVIDER_SITE_OTHER): Payer: MEDICAID | Admitting: Student

## 2023-09-18 ENCOUNTER — Ambulatory Visit (HOSPITAL_COMMUNITY): Payer: MEDICAID | Admitting: Student

## 2023-09-18 VITALS — BP 93/68 | HR 76 | Ht 60.0 in

## 2023-09-18 DIAGNOSIS — F4312 Post-traumatic stress disorder, chronic: Secondary | ICD-10-CM | POA: Diagnosis not present

## 2023-09-18 DIAGNOSIS — F418 Other specified anxiety disorders: Secondary | ICD-10-CM

## 2023-09-18 DIAGNOSIS — F3130 Bipolar disorder, current episode depressed, mild or moderate severity, unspecified: Secondary | ICD-10-CM

## 2023-09-18 DIAGNOSIS — F1411 Cocaine abuse, in remission: Secondary | ICD-10-CM

## 2023-09-18 MED ORDER — PRAZOSIN HCL 1 MG PO CAPS: 1.0000 mg | ORAL_CAPSULE | Freq: Every day | ORAL | Status: DC

## 2023-09-18 MED ORDER — ARIPIPRAZOLE 2 MG PO TABS
2.0000 mg | ORAL_TABLET | Freq: Every day | ORAL | Status: DC
Start: 2023-09-18 — End: 2023-10-23
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
## 2023-09-21 DIAGNOSIS — F3130 Bipolar disorder, current episode depressed, mild or moderate severity, unspecified: Secondary | ICD-10-CM | POA: Insufficient documentation

## 2023-09-21 DIAGNOSIS — F4312 Post-traumatic stress disorder, chronic: Secondary | ICD-10-CM | POA: Insufficient documentation

## 2023-10-11 ENCOUNTER — Telehealth: Payer: Self-pay | Admitting: Neurology

## 2023-10-11 NOTE — Telephone Encounter (Signed)
 I received a message regarding the patient's home sleep test: The patient's mother feels it would be better to pursue an in lab study and she would be willing to stay with the patient.   I have placed an order for an inlab sleep study.

## 2023-10-11 NOTE — Addendum Note (Signed)
 Addended by: Ziaire Bieser on: 10/11/2023 12:18 PM   Modules accepted: Orders

## 2023-10-16 NOTE — Telephone Encounter (Signed)
 Noted, the patient is scheduled at [facility] for 11/20/23 at 8 pm.  Sent mychart and mailed packet.  [place] no auth req pt mother will stay w/her

## 2023-10-23 ENCOUNTER — Ambulatory Visit (HOSPITAL_COMMUNITY): Payer: MEDICAID | Admitting: Student

## 2023-10-23 DIAGNOSIS — F3130 Bipolar disorder, current episode depressed, mild or moderate severity, unspecified: Secondary | ICD-10-CM | POA: Diagnosis not present

## 2023-10-23 DIAGNOSIS — F1411 Cocaine abuse, in remission: Secondary | ICD-10-CM | POA: Diagnosis not present

## 2023-10-23 DIAGNOSIS — F4312 Post-traumatic stress disorder, chronic: Secondary | ICD-10-CM | POA: Diagnosis not present

## 2023-10-23 MED ORDER — ARIPIPRAZOLE 2 MG PO TABS: 2.0000 mg | ORAL_TABLET | Freq: Every day | ORAL | Status: DC

## 2023-10-23 MED ORDER — PRAZOSIN HCL 1 MG PO CAPS: 1.0000 mg | ORAL_CAPSULE | Freq: Every day | ORAL | Status: DC

## 2023-10-23 NOTE — Progress Notes (Unsigned)
 BH MD Outpatient Progress Note  10/23/2023 11:03 AM Nicole [name]  MRN:  [id number]  Assessment:  Nicole [name] presents for follow-up evaluation in-person. Today, patient reports overall good mood, despite not liking her living environment at [facility]. She is tolerating her medications well and denies acute concerns or complaints today. She reports responding well to Prazosin  and denies adverse effects. Plan to continue medication regimen as prescribed. Mom and patient are looking into alternative options for supported living.   Identifying Information: Nicole [name] is a 48 y.o. 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   Risk Assessment: An assessment of suicide and violence risk factors was performed as part of this evaluation and is not significantly changed from the last visit.             While future psychiatric events cannot be accurately predicted, the patient does not currently require acute inpatient psychiatric care and does not currently meet [facility]  involuntary commitment criteria.          Plan:  # Bipolar 1 disorder # Chronic PTSD Past medication trials:  Status of problem: New to this writer Interventions: -- Continue Abilify  2 mg -- Continue Topamax  25 mg at bedtime per PM&R --  Continue Prazosin  1 mg nightly for trauma related anxiety and nightmares   # History of cocaine abuse Past medication trials:  Status of problem: In remission Interventions: -- Patient commended on cessation of use  Return to care in approx 6 weeks with Dr. Camillo Celestine, as this writer will be leaving the practice in late June.   Patient was given contact information for behavioral health clinic and  was instructed to call 911 for emergencies.    Patient and plan of care will be discussed with the Attending MD ,Dr. Eligio Grumbling, who agrees with the above statement and plan.   Subjective:  Chief Complaint: No chief complaint on file.   Interval History: Patient reports things overall have been going well, but she does not like [facility] due to other residents not her age. She does get along well with Lobbyist. Most people seem to have cognitive lack.   Nightmares and anxiety surrounding sleep improved. She does not belives changes to be needed to medications.   Difficulty sleeping due to roommate keeping TV on. Appetite intact. Energy intact, and denies anhedonia. Unsure of the time of her time at [facility]. Careplan meeting on Thursday. Currently on a waiting list at [facility].   Denies need for assistance with ambulating, dressing, and bathing herself. Pain fairly well controlled.   Denies SI, HI, AVH.   Denies tobacco, alcohol, and illicit substance use.   Visit Diagnosis:    ICD-10-CM   1. Bipolar I disorder, most recent episode depressed (HCC)  F31.30     2. Chronic post-traumatic stress disorder (PTSD)  F43.12     3. History of cocaine abuse (HCC)  F14.11       Past Psychiatric History:  Diagnoses: MDD, GAD,  Medication trials: Abilify , Wellbutrin, Prozac, Pristiq (worked well- helped with mood), Prazosin , Inderal  (hypotension),  Previous psychiatrist/therapist: Dr. Deborra Falter- psychiatrist. Dr. Jerrie Morales- therapist Hospitalizations: Denies Suicide attempts: 1 or 2: Teenage and early adulthood. OD on pills.  SIB: Denies Hx of violence towards others: Denies Current access to guns: Denies Hx of trauma/abuse: Yes, see HPI   Substance Abuse History in the last 12 months:  Yes.     Per 2015 documentation: has been in rehab for drugs in alcohol remotely - clean since 2007        Smoking status: Former Smoker -- 1.50 packs/day for 15 years      Types:  Cigarettes      Quit date: 05/15/2005   Smokeless tobacco: None        Comment: quit in 2007, smoked for 13 years    Alcohol Use: No        Comment: quit drinking on 2004---relapse in 2007---QUIT 07/2005   Drug Use: Yes        Comment: IV drug use///street drugs///inhalants  QUIT 07/2005    Alcohol: First use at 16. Cocaine use: Relapse in 2024. First use at 65. IVDU (Heroin): First use at 69.   Past Medical History:  Past Medical History:  Diagnosis Date   Abnormal pap    w LEEP   Alcoholism (HCC)    Allergy     Anxiety    Asthma    Asthma    Blood in stool    Depression    Depression (emotion)    Essential hypertension 01/29/2023   Frequent headaches    Hepatitis C, chronic (HCC)    hep c tx successful   Irritable bowel syndrome    OSA on CPAP - sees Dr. Silvestre Drum in ENT 10/09/2013   Seasonal allergies    scratch test in 2006///mold issue in house    Past Surgical History:  Procedure Laterality Date   DILATION AND CURETTAGE OF UTERUS  1996, 2001, 2002   x 3   LEEP  2008   NASAL FRACTURE SURGERY  1999   repair   NASAL SEPTOPLASTY W/ TURBINOPLASTY     TONSILLECTOMY      Family Psychiatric History: Dad- unspecified mental illness.    [place]: Denies   Substance Use: Dad- cocaine and alcohol; paternal cousin- unspecified drug use.   Family History:  Family History  Problem Relation Age of Onset   Leukemia Father    Emphysema Mother    Colon polyps Mother    Diverticulitis Mother    Heart disease Father    Cancer Father        unsure origin; had spot on lung   Hypertension Father    Hepatitis C Father    Alcohol abuse Father    Drug abuse Father    Stroke Maternal Grandmother    Osteoporosis Maternal Grandmother    Alcohol abuse Maternal Grandfather    Liver disease Maternal Grandfather    Migraines Neg Hx     Social History:  Academic/Vocational: Currently living at [facility].  Social History   Socioeconomic History   Marital status:  Single    Spouse name: Not on file   Number of children: 0   Years of education: Not on file   Highest education level: Not on file  Occupational History   Occupation: Adult nurse: [employer]  Tobacco Use   Smoking status: Never   Smokeless tobacco: Never  Vaping Use   Vaping status: Never Used  Substance and Sexual Activity   Alcohol use: Yes    Comment: occassional use   Drug use: Yes    Types: Crack cocaine    Comment: crack  Sexual activity: Yes    Partners: Male    Birth control/protection: Pill    Comment: First sexual intercourse at age 22,  more than 5 sexual partners   Other Topics Concern   Not on file  Social History Narrative   ** Merged History Encounter **       Work or School: Glass blower/designer Situation: lives with husband      Spiritual Beliefs: none      Lifestyle: no regular exercise; diet is fair               Social Drivers of Corporate investment banker Strain: Not on file  Food Insecurity: Food Insecurity Present (11/13/2022)   Hunger Vital Sign    Worried About Running Out of Food in the Last Year: Often true    Ran Out of Food in the Last Year: Often true  Transportation Needs: Unmet Transportation Needs (11/13/2022)   PRAPARE - Administrator, Civil Service (Medical): Yes    Lack of Transportation (Non-Medical): Yes  Physical Activity: Not on file  Stress: Not on file  Social Connections: Unknown (07/15/2022)   Received [facility]   Social Network    Social Network: Not on file    Allergies:  Allergies  Allergen Reactions   [place]ia Nut (Berthollefia Naomie Back) Anaphylaxis and Swelling   Cashew Nut (Anacardium Occidentale) Skin Test Anaphylaxis   Cashew Nut Oil Anaphylaxis    Other Reaction(s): Unknown   Triptans Anaphylaxis   Lexapro [Escitalopram Oxalate] Other (See Comments)    Mania    Ceclor [Cefaclor] Itching and Rash   Nickel Rash    Current  Medications: Current Outpatient Medications  Medication Sig Dispense Refill   acetaminophen  (TYLENOL ) 325 MG tablet Take 2 tablets (650 mg total) by mouth every 6 (six) hours as needed for mild pain, moderate pain or fever.     ARIPiprazole  (ABILIFY ) 2 MG tablet Take 1 tablet (2 mg total) by mouth daily.     bisacodyl  (FLEET) 10 MG/30ML ENEM Place 10 mg rectally once.     cholecalciferol  (CHOLECALCIFEROL ) 25 MCG tablet Take 4 tablets (4,000 Units total) by mouth daily.     levalbuterol  (XOPENEX  HFA) 45 MCG/ACT inhaler Inhale 1-2 puffs into the lungs every 6 (six) hours as needed for wheezing or shortness of breath.     levonorgestrel  (MIRENA ) 20 MCG/DAY IUD by Intrauterine route.     lidocaine  (LIDODERM ) 5 % Place 2 patches onto the skin daily. Remove & Discard patch within 12 hours or as directed by MD 30 patch 0   magnesium  gluconate (MAGONATE) 500 MG tablet Take 0.5 tablets (250 mg total) by mouth at bedtime.     magnesium  hydroxide ([employer] MILK OF MAGNESIA) 400 MG/5ML suspension Take 30 mLs by mouth daily as needed for mild constipation.     Melatonin 3 MG CAPS Take by mouth.     mirabegron  ER (MYRBETRIQ ) 25 MG TB24 tablet Take 1 tablet (25 mg total) by mouth daily.     Multiple Vitamin (MULTIVITAMINS PO) Take 1 tablet by mouth daily.      prazosin  (MINIPRESS ) 1 MG capsule Take 1 capsule (1 mg total) by mouth at bedtime.     promethazine -dextromethorphan  (PROMETHAZINE -DM) 6.25-15 MG/5ML syrup Take by mouth. (Patient not taking: Reported on 08/23/2023)     topiramate  (TOPAMAX ) 25 MG capsule Take 25 mg by mouth 2 (two) times daily.     traMADol  (ULTRAM )  50 MG tablet Take 1 tablet (50 mg total) by mouth every 12 (twelve) hours as needed for severe pain (pain score 7-10). 5 tablet 0   No current facility-administered medications for this visit.    ROS: Review of Systems  Objective:  Psychiatric Specialty Exam: There were no vitals taken for this visit.There is no height or weight on file to  calculate BMI.  General Appearance: Well Groomed  Eye Contact:  Good  Speech:  Clear and Coherent  Volume:  Normal  Mood:  Euthymic  Affect:  Appropriate and Congruent  Thought Content: WDL   Suicidal Thoughts:  No  Homicidal Thoughts:  No  Thought Process:  Coherent; still with some repetitiveness, repeating thank you after almost every statement   Orientation:  Full (Time, Place, and Person)    Memory: Immediate;   Fair Recent;   Fair Remote;   Fair  Judgment:  Fair  Insight:  Fair and Shallow  Concentration:  Concentration: Fair and Attention Span: Fair  Recall: not formally assessed  Fund of Knowledge: Fair  Language: Fair  Psychomotor Activity:  Normal  Akathisia:  No  AIMS (if indicated): not done  Assets:  Desire for Improvement Financial Resources/Insurance Housing Resilience Social Support  ADL's:  Impaired 2/2 anoxic brain injury  Cognition: Impaired,  Mild and Moderate  Sleep:  Fair   PE: General: well-appearing; no acute distress Pulm: no increased work of breathing on room air Strength & Muscle Tone: decreased Neuro: no focal neurological deficits immediately apparent Gait & Station: unsteady; ambulates with a walker  Metabolic Disorder Labs: Lab Results  Component Value Date   HGBA1C 5.1 10/31/2022   MPG 99.67 10/31/2022   No results found for: PROLACTIN Lab Results  Component Value Date   CHOL 180 08/13/2020   TRIG 171.0 (H) 08/13/2020   HDL 50.70 08/13/2020   CHOLHDL 4 08/13/2020   VLDL 34.2 08/13/2020   LDLCALC 95 08/13/2020   LDLCALC 96 06/19/2017   Lab Results  Component Value Date   TSH 2.926 10/31/2022   TSH 3.42 08/06/2019    Therapeutic Level Labs: No results found for: LITHIUM No results found for: VALPROATE No results found for: CBMZ  Screenings: GAD-7    Flowsheet Row Office Visit from 08/13/2020 in [facility] [place]  Total GAD-7 Score 9      PHQ2-9    Flowsheet Row Office Visit  from 09/18/2023 in [facility] Office Visit from 08/13/2020 in [facility] [place] Office Visit from 08/06/2019 in [facility] [place] Office Visit from 05/07/2019 in [facility] [place] Office Visit from 06/25/2018 in [facility] [place]  PHQ-2 Total Score 1 0 0 0 0  PHQ-9 Total Score -- 4 2 2 2       Flowsheet Row ED from 07/19/2023 in [facility] Emergency Department at [facility] Admission (Discharged) from 11/18/2022 in [facility] A UC from 09/20/2022 in [facility] RISK CATEGORY No Risk No Risk No Risk       Collaboration of Care: Collaboration of Care: Dr. Eligio Grumbling  Patient/Guardian was advised Release of Information must be obtained prior to any record release in order to collaborate their care with an outside provider. Patient/Guardian was advised if they have not already done so to contact the registration department to sign all necessary forms in order for us  to  release information regarding their care.   Consent: Patient/Guardian gives verbal consent for treatment and assignment of benefits for services provided during this visit. Patient/Guardian expressed understanding and agreed to proceed.   Shery Done, MD 10/23/2023 11:03 AM

## 2023-10-25 ENCOUNTER — Ambulatory Visit (INDEPENDENT_AMBULATORY_CARE_PROVIDER_SITE_OTHER): Payer: MEDICAID

## 2023-10-25 DIAGNOSIS — F4312 Post-traumatic stress disorder, chronic: Secondary | ICD-10-CM

## 2023-10-25 DIAGNOSIS — F331 Major depressive disorder, recurrent, moderate: Secondary | ICD-10-CM

## 2023-10-25 DIAGNOSIS — F1921 Other psychoactive substance dependence, in remission: Secondary | ICD-10-CM
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
## 2023-10-26 ENCOUNTER — Telehealth (HOSPITAL_COMMUNITY): Payer: Self-pay

## 2023-10-26 NOTE — Telephone Encounter (Signed)
 Chaslyn's mother, Alphonzo Jenkins who is her legal guardian called and asked this therapist to return her call.  Therapist calls and she answers. Therapist confims Cyra's full name and date of birth which Felipa Horsfall gives. Felipa Horsfall asks if Kiri has a return appointment.  Therapist explains Bree walked out of the session saying she was going to go get her mother but did not return to finish her treatment plan and take the paperwork from the skilled nursing facility.  Felipa Horsfall explains that with Dayana's apoxic brain injury that she is glad that she is doing as well as she is. Felipa Horsfall says that Nalea has some issues with her speech and can be difficult to understand.  Therapist gives Felipa Horsfall the appointment date of 11-27-23 at 2pm. Felipa Horsfall asks if this therapist knew of a placement that Kawthar could go to as her current facility gave her a 30 day notice saying she does not meet criteria for Skilled Nursing facility.  Therapist suggest she call [facility] and ask to speak with her care mgr as Hildegard Low treats the whole person and asks for assistance with identifying a group home. Felipa Horsfall thanked this therapist and says that she will call [facility].  Earnie Gola, MS, LMFT, LCAS

## 2023-10-29 ENCOUNTER — Telehealth (HOSPITAL_COMMUNITY): Payer: Self-pay
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
## 2023-11-06 ENCOUNTER — Encounter: Payer: Self-pay | Admitting: Physical Medicine and Rehabilitation

## 2023-11-06 ENCOUNTER — Encounter: Payer: MEDICAID | Attending: Physical Medicine and Rehabilitation | Admitting: Physical Medicine and Rehabilitation

## 2023-11-06 VITALS — BP 88/64 | HR 64 | Ht 60.0 in | Wt 143.2 lb

## 2023-11-06 DIAGNOSIS — M545 Low back pain, unspecified: Secondary | ICD-10-CM | POA: Diagnosis present

## 2023-11-06 DIAGNOSIS — G931 Anoxic brain damage, not elsewhere classified: Secondary | ICD-10-CM | POA: Diagnosis not present

## 2023-11-06 DIAGNOSIS — R32 Unspecified urinary incontinence: Secondary | ICD-10-CM | POA: Diagnosis present

## 2023-11-06 DIAGNOSIS — G43801 Other migraine, not intractable, with status migrainosus: Secondary | ICD-10-CM | POA: Insufficient documentation

## 2023-11-06 DIAGNOSIS — R45851 Suicidal ideations: Secondary | ICD-10-CM | POA: Insufficient documentation

## 2023-11-06 NOTE — Progress Notes (Signed)
 Subjective:    Patient ID: Nicole [name], female    DOB: Mar 03, 1976, 48 y.o.   MRN: [id number]  HPI Nicole [name] is a 48 year old woman who presents for follow-up of anoxic brain injury  1) Anoxic brain injury: -she is doing well but is not as happy with the care at [facility] -she receives PT and OT once per week -she does have time outside -she no longer qualifies for skilled nursing so her mom is trying to get her into a group home, her case manager says she is not eligible for Medicaid -her mom found her a program in [place] -at [facility] everyone is 46 to 46 years old -in the group home she would have a 48   2) Overweight: -she is concerned about her weight gain -mom says she was 107 when she entered the hospital and is now 134 lbs  3) Low back pain: -she is getting benefit from the tramadol  and lidocaine  patch -still bothering her  Pain Inventory Average Pain 9 Pain Right Now 9 My pain is constant, sharp, stabbing, and aching, right ankle pain  LOCATION OF PAIN  HEAD, BACK  BOWEL Number of stools per week: 2-3 Oral laxative use No  Type of laxative NONE Enema or suppository use No  History of colostomy No  Incontinent Yes   BLADDER Pads  Bladder incontinence Yes     Mobility walk with assistance use a walker ability to climb steps?  yes do you drive?  no Do you have any goals in this area?  yes  Function disabled: date disabled Applied  I need assistance with the following:  meal prep, household duties, shopping, and Currently at [employer] Do you have any goals in this area?  yes  Neuro/Psych bladder control problems bowel control problems trouble walking spasms confusion depression anxiety suicidal thoughts  Prior Studies Any changes since last visit?  no  Physicians involved in your care Any changes since last visit?  no   Family History  Problem Relation Age of Onset   Leukemia Father    Emphysema Mother    Colon polyps Mother     Diverticulitis Mother    Heart disease Father    Cancer Father        unsure origin; had spot on lung   Hypertension Father    Hepatitis C Father    Alcohol abuse Father    Drug abuse Father    Stroke Maternal Grandmother    Osteoporosis Maternal Grandmother    Alcohol abuse Maternal Grandfather    Liver disease Maternal Grandfather    Migraines Neg Hx    Social History   Socioeconomic History   Marital status: Single    Spouse name: Not on file   Number of children: 0   Years of education: Not on file   Highest education level: Not on file  Occupational History   Occupation: Adult nurse: [employer]  Tobacco Use   Smoking status: Never   Smokeless tobacco: Never  Vaping Use   Vaping status: Never Used  Substance and Sexual Activity   Alcohol use: Yes    Comment: occassional use   Drug use: Yes    Types: Crack cocaine    Comment: crack   Sexual activity: Yes    Partners: Male    Birth control/protection: Pill    Comment: First sexual intercourse at age 40,  more than 5 sexual partners   Other  Topics Concern   Not on file  Social History Narrative   ** Merged History Encounter **       Work or School: Glass blower/designer Situation: lives with husband      Spiritual Beliefs: none      Lifestyle: no regular exercise; diet is fair               Social Drivers of Corporate investment banker Strain: Not on file  Food Insecurity: Food Insecurity Present (11/13/2022)   Hunger Vital Sign    Worried About Running Out of Food in the Last Year: Often true    Ran Out of Food in the Last Year: Often true  Transportation Needs: Unmet Transportation Needs (11/13/2022)   PRAPARE - Administrator, Civil Service (Medical): Yes    Lack of Transportation (Non-Medical): Yes  Physical Activity: Not on file  Stress: Not on file  Social Connections: Unknown (07/15/2022)   Received from [facility]   Social Network    Social  Network: Not on file   Past Surgical History:  Procedure Laterality Date   DILATION AND CURETTAGE OF UTERUS  1996, 2001, 2002   x 3   LEEP  2008   NASAL FRACTURE SURGERY  1999   repair   NASAL SEPTOPLASTY W/ TURBINOPLASTY     TONSILLECTOMY     Past Medical History:  Diagnosis Date   Abnormal pap    w LEEP   Alcoholism (HCC)    Allergy     Anxiety    Asthma    Asthma    Blood in stool    Depression    Depression (emotion)    Essential hypertension 01/29/2023   Frequent headaches    Hepatitis C, chronic (HCC)    hep c tx successful   Irritable bowel syndrome    OSA on CPAP - sees Dr. Herminio in ENT 10/09/2013   Seasonal allergies    scratch test in 2006///mold issue in house   There were no vitals taken for this visit.  Opioid Risk Score:   Fall Risk Score:  `1  Depression screen [facility] 2/9     10/25/2023   10:17 AM 09/18/2023    8:55 AM 08/13/2020    8:46 AM 08/06/2019    9:02 AM 05/07/2019    8:15 AM 06/25/2018    9:38 AM 06/19/2017    8:54 AM  Depression screen PHQ 2/9  Decreased Interest   0 0 0 0 0  Down, Depressed, Hopeless   0 0 0 0 0  PHQ - 2 Score   0 0 0 0 0  Altered sleeping   2 1 1 1 1   Tired, decreased energy   2 1 1 1 1   Change in appetite   0 0 0 0 0  Feeling bad or failure about yourself    0 0 0 0 0  Trouble concentrating   0 0 0 0 0  Moving slowly or fidgety/restless   0 0 0 0 0  Suicidal thoughts   0 0 0 0 0  PHQ-9 Score   4 2 2 2 2   Difficult doing work/chores   Not difficult at all         Information is confidential and restricted. Go to Review Flowsheets to unlock data.    Review of Systems  Gastrointestinal:  Positive for constipation.       Incontinence  Genitourinary:  Incontinence   Musculoskeletal:  Positive for gait problem.       Spasms  Neurological:  Positive for weakness.  Psychiatric/Behavioral:  Positive for confusion and suicidal ideas.        Depression, anxiety  All other systems reviewed and are negative.       Objective:   Physical Exam Gen: no distress, normal appearing HEENT: oral mucosa pink and moist, NCAT Cardio: Reg rate Chest: normal effort, normal rate of breathing Abd: soft, non-distended Ext: no edema Psych: pleasant, normal affect, +suicidal ideation Skin: intact Neuro: Alert and oriented x3, ambulating with RW S- steady gait     Assessment & Plan:   1) Chronic Pain Syndrome secondary to low back pain -continue lidocaine  patch and tramadol  -start gabapentin  300mg  HS -recommended PT and OT three times per week -Discussed current symptoms of pain and history of pain.  -Discussed benefits of exercise in reducing pain. -Discussed following foods that may reduce pain: 1) Ginger (especially studied for arthritis)- reduce leukotriene production to decrease inflammation 2) Blueberries- high in phytonutrients that decrease inflammation 3) Salmon- marine omega-3s reduce joint swelling and pain 4) Pumpkin seeds- reduce inflammation 5) dark chocolate- reduces inflammation 6) turmeric- reduces inflammation 7) tart cherries - reduce pain and stiffness 8) extra virgin olive oil - its compound olecanthal helps to block prostaglandins  9) chili peppers- can be eaten or applied topically via capsaicin 10) mint- helpful for headache, muscle aches, joint pain, and itching 11) garlic- reduces inflammation 12) Green tea- reduces inflammation and oxidative stress, helps with weight loss, may reduce the risk of cancer, recommend Double Green Matcha [place] of Tea daily  Link to further information on diet for chronic pain: http://www.bray.com/   Turmeric to reduce inflammation--can be used in cooking or taken as a supplement.  Benefits of turmeric:  -Highly anti-inflammatory  -Increases antioxidants  -Improves memory, attention, brain disease  -Lowers risk of heart disease  -May help prevent cancer  -Decreases  pain  -Alleviates depression  -Delays aging and decreases risk of chronic disease  -Consume with black pepper to increase absorption    Turmeric Milk Recipe:  1 cup milk  1 tsp turmeric  1 tsp cinnamon  1 tsp grated ginger (optional)  Black pepper (boosts the anti-inflammatory properties of turmeric).  1 tsp honey    2) Overweight: continue topamax   3) Suicidal ideation: -discussed that these have resolved -continue abilify  2mg  daily which she had benefit from in the past -referred to psychiatry  4) Anoxic bran injury: -increase PT/OT to 1 hour daily at least 6 days per week -restart SLP  5) Urinary retention/urinary incontinence Referred to urology  6) Migraines: -continue topamax 

## 2023-11-13 ENCOUNTER — Telehealth (HOSPITAL_COMMUNITY): Payer: Self-pay

## 2023-11-13 NOTE — Telephone Encounter (Signed)
 [facility] legal guardian, Heron Sjogren calls to discuss her daughter's situation. Therapist confirms Breyona's identity by obtaining two identifiers. Heron explains that the skilled nursing facility staff had told her Aleayah does not qualify for this level of care.  Heron also found out Anjeanette makes too much in disability and is going to lose her Medicaid.  Heron says she did speak to [facility] which is a part of [employer] [facility] and staff said they would accept her disability plus 500.00 per month which Kathlene says she can pay and they would accept [facility] for admission.  Kathlene says that the staff explained she needs to have a mental Health diagnosis.  This therapist explained that Reneta carries the diagnosis of Bipolar I and Chronic PTSD and if [facility] needs a letter from this agency with said information, this thearapist can provide one.  Heron says she will call them and let this therapist know.  Darice Simpler MS, LMFT, LCAS

## 2023-11-15 ENCOUNTER — Telehealth (HOSPITAL_COMMUNITY): Payer: Self-pay

## 2023-11-15 ENCOUNTER — Encounter (HOSPITAL_COMMUNITY): Payer: Self-pay

## 2023-11-15 NOTE — Telephone Encounter (Signed)
 Nicole [name]'s legal guardian calls and says the group home associated with the [facility] needs a letter stating that Tashayla has a mental illness in order to qualify for consideration for admission. She asks if this therapist can provide this information. Therapist informs Kathlene that she is willing and can do this and will address it to [place] so she does not have to come in and sign a ROI. She stated she appreciates this.  Darice Simpler MS, LMFT, LCAS

## 2023-11-27 ENCOUNTER — Ambulatory Visit (INDEPENDENT_AMBULATORY_CARE_PROVIDER_SITE_OTHER)

## 2023-11-27 ENCOUNTER — Encounter (HOSPITAL_COMMUNITY): Payer: Self-pay

## 2023-11-27 DIAGNOSIS — F1921 Other psychoactive substance dependence, in remission: Secondary | ICD-10-CM

## 2023-11-27 DIAGNOSIS — F3130 Bipolar disorder, current episode depressed, mild or moderate severity, unspecified: Secondary | ICD-10-CM

## 2023-11-27 DIAGNOSIS — F4312 Post-traumatic stress disorder, chronic: Secondary | ICD-10-CM | POA: Diagnosis not present

## 2023-11-27 NOTE — Progress Notes (Signed)
 THERAPIST PROGRESS NOTE  Session Time: 1:59 pm to   Type of Therapy: Individual   Therapist Response/Interventions: Active Listening/discuss coping skills to prevent relapse  Treatment Goals addressed:  Problem: Substance Use     Dates: Start:  11/27/23       Disciplines: Interdisciplinary, PROVIDER        Goal: Steve will remain abstinent for 30/30 days per her report.     Dates: Start:  11/27/23    Expected End:  05/29/24       Disciplines: Interdisciplinary, PROVIDER             Goal: Khadeja will decrease her anxiety and depression by reporting no higher than a 4 on the PH-Q 9 and GAD-7.     Dates: Start:  11/27/23    Expected End:  06/09/24       Disciplines: Interdisciplinary, PROVIDER             Intervention: Therapist will assist Rachele in identifying thoughts and behaviors that can contribute to feelings of depression and anxiety.     Dates: Start:  11/27/23                Intervention: Therapist will educate Tyanne about SUDs,, Patterns and consequences, and  of use, and relapse risk and relapse prevention plans     Dates: Start:  11/27/23               Summary: Leilany presents with her mother today  Her treatment plan was developed and her mother who is her legal guardian signs it.  Brittnei's mother says her current living facility, [facility] which is a skilled nursing facility is giving them a August 27 discharge date.  If she does not discharge,Alera's mother says there will be a hearing.   Jaydi's mother says [facility] in [place]  has an opening but they are requiring a lot of paperwork which Kaleah's mother says may take 6 months.Therapist inquires about Pantera's drug cravings, has Caddie says to deal with her craving, she colors, and draws. She says she helped colored a paper school bus.   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  Progress Towards Goals:  minimal  Suicidal/Homicidal: denies  Plan: Return again ion August 19th at 1pm.  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 Patient/Guardian was advised if they have not already done so to contact the registration department to sign all necessary forms in order for us  to release information regarding their care.   Consent: Patient/Guardian gives verbal consent for treatment and assignment of benefits for services provided during this visit. Patient/Guardian expressed understanding and agreed to proceed.   Darice Jahaziel Francois,MS.  LMFT, LCAS

## 2023-11-30 ENCOUNTER — Telehealth (HOSPITAL_COMMUNITY): Payer: Self-pay

## 2023-11-30 NOTE — Telephone Encounter (Signed)
 Received in basket message from the psychiatrist that Nakeyia will see on 12-11-23 . Dr. Clarance says that she consulted with her attending who said she could not complete the paperwork until she sees Narely on 12-11-23.    This therapist calls Golda, [place]'s legal guardian and informs her of this information . Heron answers and confirms Emiah's identity by providing two verifiers.  Heron says she is afraid she will lose the placement if she does not get the paperwork back soon.  Darice Simpler, MS, LMFT, LCAS

## 2023-11-30 NOTE — Telephone Encounter (Signed)
 Called Nicole [name] who is Nicole [name]'s mother/legal guardian to let her know this therapist received the paperwork she dropped off, however it requires a physician's signature and that this therapist put the paperwork in the psychiatrist box that [name] will be seeing on  12-11-23.  Kathlene thanks this therapist for calling. Therapist in basket messaged the psychiatrist.  Darice Simpler, MS LMFT, LCAS

## 2023-12-03 NOTE — Progress Notes (Unsigned)
 [facility] MD Outpatient Progress Note  Nicole [name]  MRN:  [id number]  Assessment:  Nicole [name] presents for follow-up evaluation in-person. Today, patient is doing well overall. Main issue is sleep and this is likely due to her sleeping environment which will hopefully improve when she moves from her current facility. She feels her medications are helping her stay calm and she is utilizing her relaxation strategies appropriately. No medication changes at this time and will followup in 7 weeks.  Regarding her diagnosis of bipolar 1 disorder, per chart review on initial assessment she reported having mood activation with Lexapro which qualified for a dx of BD1. It is difficult to say if she was using cocaine at the time, will reassess this in future visits.  Plan:  # Bipolar 1 disorder # Chronic PTSD -- Continue Abilify  2 mg daily -- Continue Topamax  25 mg at bedtime per PM&R -- Continue Prazosin  1 mg nightly for trauma related anxiety and nightmares   # History of cocaine abuse, in sustained remission Most recently 10/2022 -- Patient commended on cessation of use  Patient was given contact information for behavioral health clinic and was instructed to call 911 for emergencies.    Identifying Information: Nicole [name] is a 48 y.o. 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   Subjective:  Chief Complaint:  Chief Complaint  Patient presents with   Follow-up   Medication Refill   Depression    Interval History:  Patient seen with mother.  Patient reports feeling okay today. Since the previous visit, she reports feeling well. Regarding medications, patient notes fair benefit. She states her  medications keep her stable. Patient reports the following adverse effects: denies.   Patient reports poor sleep, 3-4 hours. She has a roommate who keeps the TV on and next door there is a screaming person. Patient reports fair appetite.   Patient rates anxiety a  3/10, depression a 4/10, and anger a 3/10.   Patient denies current SI, HI, and AVH.   Stressors include other people. She feels that she isn't yelling at people at much and she enjoys listening to music, drawing, and playing Bingo.  Substance use: denies   Visit Diagnosis:    ICD-10-CM   1. Bipolar I disorder, most recent episode depressed (HCC)  F31.30     2. Chronic post-traumatic stress disorder (PTSD)  F43.12        Past Psychiatric History:  Diagnoses: MDD, GAD Medication trials: Abilify , Wellbutrin, Prozac, Pristiq (worked well- helped with mood), Prazosin , Inderal  (hypotension),  Previous psychiatrist/therapist: Dr. Vincente- psychiatrist. Dr. Ilah Anon- therapist Hospitalizations: Denies Suicide attempts: 1 or 2: Teenage and early adulthood. OD on pills.  SIB: Denies Hx of violence towards others: Denies Current access to guns: Denies Hx of trauma/abuse: Yes, see HPI   Substance Abuse History in the last 12 months:  Yes.     Alcohol: First use at 16. Cocaine use: Relapse in 2024. First use at 76. IVDU (Heroin): First use at 5.   Past Medical History:  Past Medical History:  Diagnosis Date   Abnormal pap    w LEEP   Alcoholism (HCC)    Allergy     Anxiety  Asthma    Asthma    Blood in stool    Depression    Depression (emotion)    Essential hypertension 01/29/2023   Frequent headaches    Hepatitis C, chronic (HCC)    hep c tx successful   Irritable bowel syndrome    OSA on CPAP - sees Dr. Herminio in ENT 10/09/2013   Seasonal allergies    scratch test in 2006///mold issue in house    Past Surgical History:  Procedure Laterality Date   DILATION AND CURETTAGE OF UTERUS  1996, 2001, 2002    x 3   LEEP  2008   NASAL FRACTURE SURGERY  1999   repair   NASAL SEPTOPLASTY W/ TURBINOPLASTY     TONSILLECTOMY      Family Psychiatric History: Dad- unspecified mental illness.    SA/[place]: Denies   Substance Use: Dad- cocaine and alcohol; paternal cousin- unspecified drug use.   Family History:  Family History  Problem Relation Age of Onset   Leukemia Father    Emphysema Mother    Colon polyps Mother    Diverticulitis Mother    Heart disease Father    Cancer Father        unsure origin; had spot on lung   Hypertension Father    Hepatitis C Father    Alcohol abuse Father    Drug abuse Father    Stroke Maternal Grandmother    Osteoporosis Maternal Grandmother    Alcohol abuse Maternal Grandfather    Liver disease Maternal Grandfather    Migraines Neg Hx     Social History:  Living:  Currently living at [facility].  Support: mother  Social History   Socioeconomic History   Marital status: Single    Spouse name: Not on file   Number of children: 0   Years of education: Not on file   Highest education level: Not on file  Occupational History   Occupation: Adult nurse: [employer]  Tobacco Use   Smoking status: Never   Smokeless tobacco: Never  Vaping Use   Vaping status: Never Used  Substance and Sexual Activity   Alcohol use: Yes    Comment: occassional use   Drug use: Yes    Types: Crack cocaine    Comment: crack   Sexual activity: Yes    Partners: Male    Birth control/protection: Pill    Comment: First sexual intercourse at age 67,  more than 5 sexual partners   Other Topics Concern   Not on file  Social History Narrative   ** Merged History Encounter **       Work or School: Glass blower/designer Situation: lives with husband      Spiritual Beliefs: none      Lifestyle: no regular exercise; diet is fair               Social Drivers of Corporate investment banker Strain: Not on file   Food Insecurity: Food Insecurity Present (11/13/2022)   Hunger Vital Sign    Worried About Running Out of Food in the Last Year: Often true    Ran Out of Food in the Last Year: Often true  Transportation Needs: Unmet Transportation Needs (11/13/2022)   PRAPARE - Administrator, Civil Service (Medical): Yes    Lack of Transportation (Non-Medical): Yes  Physical Activity: Not on file  Stress: Not on file  Social Connections: Unknown (07/15/2022)   Received from [facility]   Social Network    Social Network: Not on file    Allergies:  Allergies  Allergen Reactions   Estonia Nut (Berthollefia Rosanne) Anaphylaxis and Swelling   Cashew Nut (Anacardium Occidentale) Skin Test Anaphylaxis   Cashew Nut Oil Anaphylaxis    Other Reaction(s): Unknown   Triptans Anaphylaxis   Lexapro [Escitalopram Oxalate] Other (See Comments)    Mania    Ceclor [Cefaclor] Itching and Rash   Nickel Rash    Current Medications: Current Outpatient Medications  Medication Sig Dispense Refill   acetaminophen  (TYLENOL ) 325 MG tablet Take 2 tablets (650 mg total) by mouth every 6 (six) hours as needed for mild pain, moderate pain or fever.     ARIPiprazole  (ABILIFY ) 2 MG tablet Take 1 tablet (2 mg total) by mouth daily.     bisacodyl  (FLEET) 10 MG/30ML ENEM Place 10 mg rectally once.     cholecalciferol  (CHOLECALCIFEROL ) 25 MCG tablet Take 4 tablets (4,000 Units total) by mouth daily.     levalbuterol  (XOPENEX  HFA) 45 MCG/ACT inhaler Inhale 1-2 puffs into the lungs every 6 (six) hours as needed for wheezing or shortness of breath.     levonorgestrel  (MIRENA ) 20 MCG/DAY IUD by Intrauterine route.     lidocaine  (LIDODERM ) 5 % Place 2 patches onto the skin daily. Remove & Discard patch within 12 hours or as directed by MD 30 patch 0   magnesium  gluconate (MAGONATE) 500 MG tablet Take 0.5 tablets (250 mg total) by mouth at bedtime.     magnesium  hydroxide ([employer] MILK OF MAGNESIA) 400 MG/5ML suspension Take  30 mLs by mouth daily as needed for mild constipation.     Melatonin 3 MG CAPS Take by mouth.     mirabegron  ER (MYRBETRIQ ) 25 MG TB24 tablet Take 1 tablet (25 mg total) by mouth daily.     Multiple Vitamin (MULTIVITAMINS PO) Take 1 tablet by mouth daily.      prazosin  (MINIPRESS ) 1 MG capsule Take 1 capsule (1 mg total) by mouth at bedtime.     promethazine -dextromethorphan  (PROMETHAZINE -DM) 6.25-15 MG/5ML syrup Take by mouth.     topiramate  (TOPAMAX ) 25 MG capsule Take 25 mg by mouth 2 (two) times daily.     traMADol  (ULTRAM ) 50 MG tablet Take 1 tablet (50 mg total) by mouth every 12 (twelve) hours as needed for severe pain (pain score 7-10). 5 tablet 0   No current facility-administered medications for this visit.    Objective: Psychiatric Specialty Exam: General Appearance: appears at stated age, casually dressed and groomed, uses walker  Behavior: pleasant and cooperative   Psychomotor Activity: no psychomotor agitation or retardation noted   Eye Contact: fair  Speech: normal amount, volume and fluency    Mood: euthymic  Affect: congruent, pleasant and interactive   Thought Process: concrete Descriptions of Associations: intact   Thought Content Hallucinations: denies AH, VH , does not appear responding to stimuli  Delusions: no paranoia, delusions of control, grandeur, ideas of reference, thought broadcasting, and magical thinking  Suicidal Thoughts: denies SI, intention, plan  Homicidal Thoughts: denies HI, intention, plan   Alertness/Orientation: alert and fully oriented   Insight: limited Judgment: fair  Memory: intact   Executive Functions  Concentration: intact  Attention Span: fair  Recall: intact  Fund of Knowledge: fair   Physical Exam  General: Pleasant, well-appearing . No acute distress. Pulmonary: Normal effort. No wheezing or rales. Skin: No obvious rash or lesions.  Neuro: A&Ox3.No focal deficit.  Review of Systems  No reported  symptoms   Metabolic Disorder Labs: Lab Results  Component Value Date   HGBA1C 5.1 10/31/2022   MPG 99.67 10/31/2022   No results found for: PROLACTIN Lab Results  Component Value Date   CHOL 180 08/13/2020   TRIG 171.0 (H) 08/13/2020   HDL 50.70 08/13/2020   CHOLHDL 4 08/13/2020   VLDL 34.2 08/13/2020   LDLCALC 95 08/13/2020   LDLCALC 96 06/19/2017   Lab Results  Component Value Date   TSH 2.926 10/31/2022   TSH 3.42 08/06/2019    Therapeutic Level Labs: No results found for: LITHIUM No results found for: VALPROATE No results found for: CBMZ  Screenings: GAD-7    Advertising copywriter from 10/25/2023 in [facility] Office Visit from 08/13/2020 in [facility] [place]  Total GAD-7 Score 5 9   PHQ2-9    Flowsheet Row Office Visit from 11/06/2023 in [facility] Counselor from 10/25/2023 in [facility] Office Visit from 09/18/2023 in [facility] Office Visit from 08/13/2020 in [facility] [place] Office Visit from 08/06/2019 in [facility] [place]  PHQ-2 Total Score 0 1 1 0 0  PHQ-9 Total Score -- -- -- 4 2   Flowsheet Row Counselor from 10/25/2023 in [facility] ED from 07/19/2023 in [facility] Emergency Department at [facility] Admission (Discharged) from 11/18/2022 in [facility] CENTER A  C-SSRS RISK CATEGORY Moderate Risk No Risk No Risk   Ismael Franco, MD PGY-3 Psychiatry Resident

## 2023-12-11 ENCOUNTER — Ambulatory Visit (INDEPENDENT_AMBULATORY_CARE_PROVIDER_SITE_OTHER): Admitting: Psychiatry

## 2023-12-11 VITALS — BP 103/64 | Wt 143.6 lb

## 2023-12-11 DIAGNOSIS — F3130 Bipolar disorder, current episode depressed, mild or moderate severity, unspecified: Secondary | ICD-10-CM | POA: Diagnosis not present

## 2023-12-11 DIAGNOSIS — F4312 Post-traumatic stress disorder, chronic: Secondary | ICD-10-CM

## 2023-12-11 MED ORDER — PRAZOSIN HCL 1 MG PO CAPS: 1.0000 mg | ORAL_CAPSULE | Freq: Every day | ORAL | 0 refills | Status: DC

## 2023-12-11 MED ORDER — TOPIRAMATE 25 MG PO CPSP: 25.0000 mg | ORAL_CAPSULE | Freq: Two times a day (BID) | ORAL | 1 refills | Status: DC

## 2023-12-11 MED ORDER — ARIPIPRAZOLE 2 MG PO TABS: 2.0000 mg | ORAL_TABLET | Freq: Every day | ORAL | 0 refills | Status: DC

## 2023-12-11 NOTE — Patient Instructions (Addendum)
 Thank you for attending your appointment today.  -- We did not make any medication changes today. Please continue medications as prescribed.  - DIAGNOSIS: bipolar 1 disorder, chronic PTSD - MEDICATIONS: Abilify , Topamax , and Prazosin   If you have questions about requesting your medical records from a [facility], please contact the following Health Information Management offices:  Phone: [phone]  Please do not make any changes to medications without first discussing with your provider. If you are experiencing a psychiatric emergency, please call 911 or present to your nearest emergency department. Additional crisis, medication management, and therapy resources are included below.  [facility]  [address] [place], [place] [address] [phone] WALK-IN URGENT CARE 24/7 FOR ANYONE [address], [place], [place]  [phone] Fax: [phone] [email or web address] *Interpreters available *Accepts all insurance and uninsured for Urgent Care needs *Accepts Medicaid and uninsured for outpatient treatment (below)      ONLY FOR [facility]  Below:    Outpatient New Patient Assessment/Therapy Walk-ins:        Monday, Wednesday, and Thursday 8am until slots are full (first come, first served)                   New Patient Psychiatry/Medication Management        Monday-Friday 8am-11am (first come, first served)               For all walk-ins we ask that you arrive by 7:15am, because patients will be seen in the order of arrival.

## 2024-01-01 ENCOUNTER — Ambulatory Visit (INDEPENDENT_AMBULATORY_CARE_PROVIDER_SITE_OTHER)

## 2024-01-01 DIAGNOSIS — F3130 Bipolar disorder, current episode depressed, mild or moderate severity, unspecified: Secondary | ICD-10-CM

## 2024-01-01 DIAGNOSIS — F4312 Post-traumatic stress disorder, chronic: Secondary | ICD-10-CM

## 2024-01-01 DIAGNOSIS — F1921 Other psychoactive substance dependence, in remission: Secondary | ICD-10-CM

## 2024-01-01 NOTE — Progress Notes (Signed)
 THERAPIST PROGRESS NOTE  Session Time: 1:59 pm to 1:34  Type of Therapy: Individual   Therapist Response/Interventions: Active Listening/discuss coping skills to deal with frustration of loss of functioning due to apoxic brain injury  Treatment Goals addressed:  Problem: Substance Use     Dates: Start:  11/27/23       Disciplines: Interdisciplinary, PROVIDER        Goal: Nicole [name] will remain abstinent for 30/30 days per her report.     Dates: Start:  11/27/23    Expected End:  05/29/24       Disciplines: Interdisciplinary, PROVIDER             Goal: Nicole [name] will decrease her anxiety and depression by reporting no higher than a 4 on the PH-Q 9 and GAD-7.     Dates: Start:  11/27/23    Expected End:  06/09/24       Disciplines: Interdisciplinary, PROVIDER             Intervention: Therapist will assist Nicole [name] in identifying thoughts and behaviors that can contribute to feelings of depression and anxiety.     Dates: Start:  11/27/23                Intervention: Therapist will educate Nicole [name] about SUDs,, Patterns and consequences, and  of use, and relapse risk and relapse prevention plans     Dates: Start:  11/27/23               Summary: Nicole [name] presents in person today with her legal guardian.  Mother/legal guardian is going to appeal the decision to evict Nicole [name] from [facility]. Mother has located a group home, [facility] that will accept Nicole [name] if she can get approved for medicaid. Mother has been told that Nicole [name] makes too much in disability to get Medicaid. Mother has been told once Nicole [name] discharges from skilled nursing she will lose her Medicaid   Nicole [name] rates her depression today as an 8 because she does not have anywhere to go after the skilled nursing facility.  Mother says Nicole [name]'s roommate wants it dark in the room and listens to TV all night and she has a problem with this. Nicole [name] says the facility will not. Nicole [name] tears up when recalling her previous level of  functioning, as she was a designer/artist.  She says she is able to color now and do word find puzzles but cannot comprehend what she is reading.    Nicole [name] rates her anxiety today at a 6.  She says coloring and writing stuff helps it.  She discusses how she used to write about stream of consciousness and still tries to do some of that.   Nicole [name] not to use any substances.  Progress Towards Goals: some progress  Suicidal/Homicidal: denies  Plan: Return again on 02-05-24  Diagnosis: Polysubstance Use in early remission,  PTSD, Bipolar I  Collaboration of Care: N/A  Patient/Guardian was advised Release of Information must be obtained prior to any record release in order to collaborate their care with an outside provider. Patient/Guardian was advised if they have not already done so to contact the registration department to sign all necessary forms in order for us  to release information regarding their care.   Consent: Patient/Guardian gives verbal consent for treatment and assignment of benefits for services provided during this visit. Patient/Guardian expressed understanding and agreed to proceed.   Nicole Sarabeth Benton,MS.  LMFT, LCAS

## 2024-01-21 NOTE — Progress Notes (Signed)
 [facility] MD Outpatient Progress Note  Nicole [name]  MRN:  [id number]  Assessment:  Nicole [name] presents for follow-up evaluation in-person. Patient is doing well regarding symptoms of depression and anxiety and reports spending her time during the day with activities that she enjoys. She recently moved to a new facility in which she notes is going well. There was concern for poor sleep but upon further discussion, sleep environment is affected at this time, discussed consulting the facility management regarding this. No medication changes at this time, f/u in 2 months.   Plan:  # Bipolar 1 disorder (mood activation vs manic episode post Lexapro) vs. MDD # Chronic PTSD -- Continue Abilify  2 mg daily  -Ordered TSH, lipid panel. and A1c -- Continue Topamax  25 mg at bedtime per PM&R -- Continue Prazosin  1 mg nightly for trauma related anxiety and nightmares   # History of cocaine abuse, in sustained remission Most recently 10/2022 -- Patient commended on cessation of use  Patient was given contact information for behavioral health clinic and was instructed to call 911 for emergencies.    Identifying Information: Nicole [name] is a 48 y.o. 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   Subjective:  Chief Complaint:  Chief Complaint  Patient presents with   Follow-up    Interval History:  Patient seen with mother.  Patient reports feeling well today. Since the previous visit, she states that she looks at birds and coloring. She moved to [facility] and she states the move is good.   Regarding psychiatric symptoms, she notes they are okay. Patient reports the following adverse effects: denies.    Patient reports difficult sleeping, she states that the TV stays on. I encouraged her to speak with the management. Patient reports good appetite.   Patient denies current SI, HI, and AVH.   Substance use: denies  Visit Diagnosis:    ICD-10-CM   1. Encounter for medication monitoring  Z51.81 TSH    Lipid panel    HgB A1c    2. Bipolar I disorder, most recent episode depressed (HCC)  F31.30 ARIPiprazole  (ABILIFY ) 2 MG tablet    topiramate  (TOPAMAX ) 25 MG capsule    3. Chronic post-traumatic stress disorder (PTSD)  F43.12 prazosin  (MINIPRESS ) 1 MG capsule      Past Psychiatric History:  Diagnoses: MDD, GAD Medication trials: Abilify , Wellbutrin, Prozac, Pristiq (worked well- helped with mood), Prazosin , Inderal  (hypotension),  Previous psychiatrist/therapist: Dr. Vincente- psychiatrist. Dr. Ilah Anon- therapist Hospitalizations: Denies Suicide attempts: 1 or 2: Teenage and early adulthood. OD on pills.  SIB: Denies Hx of violence towards others: Denies Current access to guns: Denies Hx of trauma/abuse: Yes, see HPI   Substance Abuse History in the last 12 months:  Yes.     Alcohol: First use at 16. Cocaine use: Relapse in 2024. First use at 24. IVDU (Heroin): First use at 68.   Family Psychiatric History:  Dad- unspecified mental illness.  [place]: Denies Substance Use: Dad- cocaine and alcohol; paternal cousin- unspecified drug use.   Social History:  Living:  Currently living at [facility].  Support: mother  Past Medical History:  Past Medical History:  Diagnosis Date   Abnormal  pap    w LEEP   Alcoholism (HCC)    Allergy     Anxiety    Asthma    Asthma    Blood in stool    Depression    Depression (emotion)    Essential hypertension 01/29/2023   Frequent headaches    Hepatitis C, chronic (HCC)    hep c tx successful   Irritable bowel syndrome    OSA on CPAP - sees Dr. Herminio in ENT 10/09/2013   Seasonal allergies    scratch test in  2006///mold issue in house    Past Surgical History:  Procedure Laterality Date   DILATION AND CURETTAGE OF UTERUS  1996, 2001, 2002   x 3   LEEP  2008   NASAL FRACTURE SURGERY  1999   repair   NASAL SEPTOPLASTY W/ TURBINOPLASTY     TONSILLECTOMY      Family History:  Family History  Problem Relation Age of Onset   Leukemia Father    Emphysema Mother    Colon polyps Mother    Diverticulitis Mother    Heart disease Father    Cancer Father        unsure origin; had spot on lung   Hypertension Father    Hepatitis C Father    Alcohol abuse Father    Drug abuse Father    Stroke Maternal Grandmother    Osteoporosis Maternal Grandmother    Alcohol abuse Maternal Grandfather    Liver disease Maternal Grandfather    Migraines Neg Hx     Social History   Socioeconomic History   Marital status: Single    Spouse name: Not on file   Number of children: 0   Years of education: Not on file   Highest education level: Not on file  Occupational History   Occupation: Adult nurse: [employer]  Tobacco Use   Smoking status: Never   Smokeless tobacco: Never  Vaping Use   Vaping status: Never Used  Substance and Sexual Activity   Alcohol use: Yes    Comment: occassional use   Drug use: Yes    Types: Crack cocaine    Comment: crack   Sexual activity: Yes    Partners: Male    Birth control/protection: Pill    Comment: First sexual intercourse at age 88,  more than 5 sexual partners   Other Topics Concern   Not on file  Social History Narrative   ** Merged History Encounter **       Work or School: Glass blower/designer Situation: lives with husband      Spiritual Beliefs: none      Lifestyle: no regular exercise; diet is fair               Social Drivers of Corporate investment banker Strain: Not on file  Food Insecurity: Food Insecurity Present (11/13/2022)   Hunger Vital Sign    Worried About Running Out of Food in the Last  Year: Often true    Ran Out of Food in the Last Year: Often true  Transportation Needs: Unmet Transportation Needs (11/13/2022)   PRAPARE - Administrator, Civil Service (Medical): Yes    Lack of Transportation (Non-Medical): Yes  Physical Activity: Not on file  Stress: Not on file  Social Connections: Unknown (07/15/2022)   Received from [facility]   Social Network    Social Network: Not on  file    Allergies:  Allergies  Allergen Reactions   [place] Nut (Berthollefia Rosanne) Anaphylaxis and Swelling   Cashew Nut (Anacardium Occidentale) Skin Test Anaphylaxis   Cashew Nut Oil Anaphylaxis    Other Reaction(s): Unknown   Triptans Anaphylaxis   Lexapro [Escitalopram Oxalate] Other (See Comments)    Mania    Ceclor [Cefaclor] Itching and Rash   Nickel Rash    Current Medications: Current Outpatient Medications  Medication Sig Dispense Refill   acetaminophen  (TYLENOL ) 325 MG tablet Take 2 tablets (650 mg total) by mouth every 6 (six) hours as needed for mild pain, moderate pain or fever.     ARIPiprazole  (ABILIFY ) 2 MG tablet Take 1 tablet (2 mg total) by mouth daily. 60 tablet 0   bisacodyl  (FLEET) 10 MG/30ML ENEM Place 10 mg rectally once.     cholecalciferol  (CHOLECALCIFEROL ) 25 MCG tablet Take 4 tablets (4,000 Units total) by mouth daily.     levalbuterol  (XOPENEX  HFA) 45 MCG/ACT inhaler Inhale 1-2 puffs into the lungs every 6 (six) hours as needed for wheezing or shortness of breath.     levonorgestrel  (MIRENA ) 20 MCG/DAY IUD by Intrauterine route.     lidocaine  (LIDODERM ) 5 % Place 2 patches onto the skin daily. Remove & Discard patch within 12 hours or as directed by MD 30 patch 0   magnesium  gluconate (MAGONATE) 500 MG tablet Take 0.5 tablets (250 mg total) by mouth at bedtime.     magnesium  hydroxide ([employer] MILK OF MAGNESIA) 400 MG/5ML suspension Take 30 mLs by mouth daily as needed for mild constipation.     Melatonin 3 MG CAPS Take by mouth.     mirabegron  ER  (MYRBETRIQ ) 25 MG TB24 tablet Take 1 tablet (25 mg total) by mouth daily.     Multiple Vitamin (MULTIVITAMINS PO) Take 1 tablet by mouth daily.      prazosin  (MINIPRESS ) 1 MG capsule Take 1 capsule (1 mg total) by mouth at bedtime.     promethazine -dextromethorphan  (PROMETHAZINE -DM) 6.25-15 MG/5ML syrup Take by mouth.     topiramate  (TOPAMAX ) 25 MG capsule Take 1 capsule (25 mg total) by mouth 2 (two) times daily. 60 capsule 1   traMADol  (ULTRAM ) 50 MG tablet Take 1 tablet (50 mg total) by mouth every 12 (twelve) hours as needed for severe pain (pain score 7-10). 5 tablet 0   No current facility-administered medications for this visit.    Objective: Psychiatric Specialty Exam: General Appearance: appears at stated age, casually dressed and groomed   Behavior: pleasant and cooperative   Psychomotor Activity: no psychomotor agitation or retardation noted   Eye Contact: fair  Speech: normal amount, volume and fluency    Mood: euthymic  Affect: congruent, pleasant and interactive   Thought Process: concrete, repeating thank you Descriptions of Associations: intact   Thought Content Hallucinations: denies AH, VH , does not appear responding to stimuli  Delusions: no paranoia, delusions of control, grandeur, ideas of reference, thought broadcasting, and magical thinking  Suicidal Thoughts: denies SI, intention, plan  Homicidal Thoughts: denies HI, intention, plan   Alertness/Orientation: alert and fully oriented   Insight: fair Judgment: fair  Memory: intact   Executive Functions  Concentration: intact  Attention Span: fair  Recall: intact  Fund of Knowledge: fair   Physical Exam  General: Pleasant, well-appearing . No acute distress. Pulmonary: Normal effort. No wheezing or rales. Skin: No obvious rash or lesions. Neuro: A&Ox3.No focal deficit.  Review of Systems  No reported symptoms  Metabolic  Disorder Labs: Lab Results  Component Value Date   HGBA1C 5.1  10/31/2022   MPG 99.67 10/31/2022   No results found for: PROLACTIN Lab Results  Component Value Date   CHOL 180 08/13/2020   TRIG 171.0 (H) 08/13/2020   HDL 50.70 08/13/2020   CHOLHDL 4 08/13/2020   VLDL 34.2 08/13/2020   LDLCALC 95 08/13/2020   LDLCALC 96 06/19/2017   Lab Results  Component Value Date   TSH 2.926 10/31/2022   TSH 3.42 08/06/2019    Therapeutic Level Labs: No results found for: LITHIUM No results found for: VALPROATE No results found for: CBMZ  Screenings: GAD-7    Advertising copywriter from 10/25/2023 in [facility] Office Visit from 08/13/2020 in [facility] [place]  Total GAD-7 Score 5 9   PHQ2-9    Flowsheet Row Office Visit from 11/06/2023 in [facility] Counselor from 10/25/2023 in [facility] Office Visit from 09/18/2023 in [facility] Office Visit from 08/13/2020 in [facility] [place] Office Visit from 08/06/2019 in [facility] [place]  PHQ-2 Total Score 0 1 1 0 0  PHQ-9 Total Score -- -- -- 4 2   Flowsheet Row Counselor from 10/25/2023 in [facility] ED from 07/19/2023 in [facility] Department at [facility] Admission (Discharged) from 11/18/2022 in [facility] A  C-SSRS RISK CATEGORY Moderate Risk No Risk No Risk   Ismael Franco, MD PGY-3 Psychiatry Resident

## 2024-01-29 ENCOUNTER — Ambulatory Visit (INDEPENDENT_AMBULATORY_CARE_PROVIDER_SITE_OTHER): Admitting: Psychiatry

## 2024-01-29 DIAGNOSIS — Z5181 Encounter for therapeutic drug level monitoring: Secondary | ICD-10-CM

## 2024-01-29 DIAGNOSIS — F4312 Post-traumatic stress disorder, chronic: Secondary | ICD-10-CM | POA: Diagnosis not present

## 2024-01-29 DIAGNOSIS — F3130 Bipolar disorder, current episode depressed, mild or moderate severity, unspecified: Secondary | ICD-10-CM

## 2024-01-29 MED ORDER — TOPIRAMATE 25 MG PO CPSP: 25.0000 mg | ORAL_CAPSULE | Freq: Two times a day (BID) | ORAL | 1 refills | Status: DC

## 2024-01-29 MED ORDER — PRAZOSIN HCL 1 MG PO CAPS: 1.0000 mg | ORAL_CAPSULE | Freq: Every day | ORAL | Status: AC

## 2024-01-29 MED ORDER — ARIPIPRAZOLE 2 MG PO TABS
2.0000 mg | ORAL_TABLET | Freq: Every day | ORAL | 0 refills | Status: AC
Start: 2024-01-29 — End: 2024-03-29

## 2024-01-29 NOTE — Addendum Note (Signed)
 Addended by: MERCY DOMINO A on: 01/29/2024 11:38 AM   Modules accepted: Orders

## 2024-02-04 ENCOUNTER — Other Ambulatory Visit (INDEPENDENT_AMBULATORY_CARE_PROVIDER_SITE_OTHER)

## 2024-02-04 DIAGNOSIS — Z5181 Encounter for therapeutic drug level monitoring: Secondary | ICD-10-CM | POA: Diagnosis not present

## 2024-02-04 NOTE — Progress Notes (Signed)
 Patient presented to office to labs draw. Patient's labs were drawn in [facility]. Patient tolerated labs well. Patient was alert and ambulatory.

## 2024-02-05 ENCOUNTER — Encounter: Payer: Self-pay | Admitting: Physical Medicine and Rehabilitation

## 2024-02-05 ENCOUNTER — Encounter: Attending: Physical Medicine and Rehabilitation | Admitting: Physical Medicine and Rehabilitation

## 2024-02-05 ENCOUNTER — Ambulatory Visit (INDEPENDENT_AMBULATORY_CARE_PROVIDER_SITE_OTHER)

## 2024-02-05 VITALS — BP 96/65 | HR 71 | Ht 60.0 in | Wt 148.8 lb

## 2024-02-05 DIAGNOSIS — E663 Overweight: Secondary | ICD-10-CM

## 2024-02-05 DIAGNOSIS — R32 Unspecified urinary incontinence: Secondary | ICD-10-CM | POA: Diagnosis present

## 2024-02-05 DIAGNOSIS — G894 Chronic pain syndrome: Secondary | ICD-10-CM

## 2024-02-05 DIAGNOSIS — G43909 Migraine, unspecified, not intractable, without status migrainosus: Secondary | ICD-10-CM

## 2024-02-05 DIAGNOSIS — M545 Low back pain, unspecified: Secondary | ICD-10-CM

## 2024-02-05 DIAGNOSIS — Z6829 Body mass index (BMI) 29.0-29.9, adult: Secondary | ICD-10-CM | POA: Diagnosis not present

## 2024-02-05 DIAGNOSIS — Z79899 Other long term (current) drug therapy: Secondary | ICD-10-CM | POA: Insufficient documentation

## 2024-02-05 DIAGNOSIS — F3130 Bipolar disorder, current episode depressed, mild or moderate severity, unspecified: Secondary | ICD-10-CM

## 2024-02-05 DIAGNOSIS — G931 Anoxic brain damage, not elsewhere classified: Secondary | ICD-10-CM | POA: Insufficient documentation

## 2024-02-05 DIAGNOSIS — G43801 Other migraine, not intractable, with status migrainosus: Secondary | ICD-10-CM | POA: Diagnosis not present

## 2024-02-05 DIAGNOSIS — F1921 Other psychoactive substance dependence, in remission: Secondary | ICD-10-CM

## 2024-02-05 NOTE — Progress Notes (Signed)
 Subjective:    Patient ID: Nicole [name], female    DOB: Mar 24, 1976, 48 y.o.   MRN: [id number]  HPI Nicole [name] is a 48 year old woman who presents for follow-up of anoxic brain injury  1) Anoxic brain injury: -she is doing well but is not as happy with the care at [facility] -she receives PT and OT once per week -she does have time outside -she no longer qualifies for skilled nursing so her mom is trying to get her into a group home, her case manager says she is not eligible for Medicaid -her mom found her a program in [place] -at [facility] everyone is 62 to 46 years old -in the group home she would have a 76  -she is in an assisted living center but she hates it -she loses her medicaid but has to apply for a hearing  2) Overweight: -she is concerned about her weight gain -mom says she was 107 when she entered the hospital and is now 134 lbs  3) Low back pain: -she is getting benefit from the tramadol  and lidocaine  patch -still bothering her  4) Urinary incontinence -has not followed with urology  Pain Inventory Average Pain 0 Pain Right Now 0 My pain no pain LOCATION OF PAIN  HEAD, BACK  BOWEL Number of stools per week: 2-3 Oral laxative use No  Type of laxative NONE Enema or suppository use No  History of colostomy No  Incontinent Yes   BLADDER Pads  Mother reports incontinence is   Bladder incontinence Yes     Mobility walk with assistance use a walker ability to climb steps?  yes do you drive?  no Do you have any goals in this area?  yes  Function disabled: date disabled Applied  I need assistance with the following:  meal prep, household duties, shopping, and Currently at [employer] Do you have any goals in this area?  yes  Neuro/Psych bladder control problems bowel control problems trouble walking spasms confusion depression anxiety suicidal thoughts  Prior Studies Any changes since last visit?  no  Physicians involved in your  care Any changes since last visit?  no   Family History  Problem Relation Age of Onset   Leukemia Father    Emphysema Mother    Colon polyps Mother    Diverticulitis Mother    Heart disease Father    Cancer Father        unsure origin; had spot on lung   Hypertension Father    Hepatitis C Father    Alcohol abuse Father    Drug abuse Father    Stroke Maternal Grandmother    Osteoporosis Maternal Grandmother    Alcohol abuse Maternal Grandfather    Liver disease Maternal Grandfather    Migraines Neg Hx    Social History   Socioeconomic History   Marital status: Single    Spouse name: Not on file   Number of children: 0   Years of education: Not on file   Highest education level: Not on file  Occupational History   Occupation: Adult nurse: [employer]  Tobacco Use   Smoking status: Never   Smokeless tobacco: Never  Vaping Use   Vaping status: Never Used  Substance and Sexual Activity   Alcohol use: Yes    Comment: occassional use   Drug use: Yes    Types: Crack cocaine    Comment: crack   Sexual activity: Yes  Partners: Male    Birth control/protection: Pill    Comment: First sexual intercourse at age 85,  more than 5 sexual partners   Other Topics Concern   Not on file  Social History Narrative   ** Merged History Encounter **       Work or School: Glass blower/designer Situation: lives with husband      Spiritual Beliefs: none      Lifestyle: no regular exercise; diet is fair               Social Drivers of Corporate investment banker Strain: Not on file  Food Insecurity: Food Insecurity Present (11/13/2022)   Hunger Vital Sign    Worried About Running Out of Food in the Last Year: Often true    Ran Out of Food in the Last Year: Often true  Transportation Needs: Unmet Transportation Needs (11/13/2022)   PRAPARE - Administrator, Civil Service (Medical): Yes    Lack of Transportation (Non-Medical):  Yes  Physical Activity: Not on file  Stress: Not on file  Social Connections: Unknown (07/15/2022)   Received from [facility]   Social Network    Social Network: Not on file   Past Surgical History:  Procedure Laterality Date   DILATION AND CURETTAGE OF UTERUS  1996, 2001, 2002   x 3   LEEP  2008   NASAL FRACTURE SURGERY  1999   repair   NASAL SEPTOPLASTY W/ TURBINOPLASTY     TONSILLECTOMY     Past Medical History:  Diagnosis Date   Abnormal pap    w LEEP   Alcoholism (HCC)    Allergy     Anxiety    Asthma    Asthma    Blood in stool    Depression    Depression (emotion)    Essential hypertension 01/29/2023   Frequent headaches    Hepatitis C, chronic (HCC)    hep c tx successful   Irritable bowel syndrome    OSA on CPAP - sees Dr. Herminio in ENT 10/09/2013   Seasonal allergies    scratch test in 2006///mold issue in house   BP 96/65 (BP Location: Right Arm, Patient Position: Sitting, Cuff Size: Normal)   Pulse 71   Ht 5' (1.524 m)   Wt 148 lb 12.8 oz (67.5 kg)   SpO2 98%   BMI 29.06 kg/m   Opioid Risk Score:   Fall Risk Score:  `1  Depression screen [facility] 2/9     02/05/2024   11:15 AM 11/06/2023   11:15 AM 10/25/2023   10:17 AM 09/18/2023    8:55 AM 08/13/2020    8:46 AM 08/06/2019    9:02 AM 05/07/2019    8:15 AM  Depression screen PHQ 2/9  Decreased Interest 0 0   0 0 0  Down, Depressed, Hopeless 2 0   0 0 0  PHQ - 2 Score 2 0   0 0 0  Altered sleeping 3    2 1 1   Tired, decreased energy 3    2 1 1   Change in appetite 0    0 0 0  Feeling bad or failure about yourself  0    0 0 0  Trouble concentrating 0    0 0 0  Moving slowly or fidgety/restless 0    0 0 0  Suicidal thoughts 0    0 0 0  PHQ-9 Score 8  4 2 2   Difficult doing work/chores Extremely dIfficult    Not difficult at all       Information is confidential and restricted. Go to Review Flowsheets to unlock data.    Review of Systems  Gastrointestinal:  Positive for constipation.        Incontinence  Genitourinary:        Incontinence   Musculoskeletal:  Positive for gait problem.       Spasms  Neurological:  Positive for weakness.  Psychiatric/Behavioral:  Positive for confusion and suicidal ideas.        Depression, anxiety  All other systems reviewed and are negative.      Objective:   Physical Exam Gen: no distress, normal appearing HEENT: oral mucosa pink and moist, NCAT Cardio: Reg rate Chest: normal effort, normal rate of breathing Abd: soft, non-distended Ext: no edema Psych: pleasant, normal affect, +suicidal ideation Skin: intact Neuro: Alert and oriented x3, ambulating with RW S- steady gait, stable 02/05/24     Assessment & Plan:   1) Chronic Pain Syndrome secondary to low back pain -continue lidocaine  patch and tramadol  -start gabapentin  300mg  HS -recommended PT and OT three times per week -Discussed current symptoms of pain and history of pain.  -Discussed benefits of exercise in reducing pain. -Discussed following foods that may reduce pain: 1) Ginger (especially studied for arthritis)- reduce leukotriene production to decrease inflammation 2) Blueberries- high in phytonutrients that decrease inflammation 3) Salmon- marine omega-3s reduce joint swelling and pain 4) Pumpkin seeds- reduce inflammation 5) dark chocolate- reduces inflammation 6) turmeric- reduces inflammation 7) tart cherries - reduce pain and stiffness 8) extra virgin olive oil - its compound olecanthal helps to block prostaglandins  9) chili peppers- can be eaten or applied topically via capsaicin 10) mint- helpful for headache, muscle aches, joint pain, and itching 11) garlic- reduces inflammation 12) Green tea- reduces inflammation and oxidative stress, helps with weight loss, may reduce the risk of cancer, recommend Double Green Matcha [place] of Tea daily  Link to further information on diet for chronic pain:  http://www.bray.com/   Turmeric to reduce inflammation--can be used in cooking or taken as a supplement.  Benefits of turmeric:  -Highly anti-inflammatory  -Increases antioxidants  -Improves memory, attention, brain disease  -Lowers risk of heart disease  -May help prevent cancer  -Decreases pain  -Alleviates depression  -Delays aging and decreases risk of chronic disease  -Consume with black pepper to increase absorption    Turmeric Milk Recipe:  1 cup milk  1 tsp turmeric  1 tsp cinnamon  1 tsp grated ginger (optional)  Black pepper (boosts the anti-inflammatory properties of turmeric).  1 tsp honey    2) Overweight: continue topamax   3) Suicidal ideation: -discussed that these have resolved -continue abilify  2mg  daily which she had benefit from in the past -referred to psychiatry  4) Anoxic bran injury: -increase PT/OT to 1 hour daily at least 6 days per week -restart SLP -discussed that she wants to go to day program and she would benefit from the stimulation, provided note to indicate this -completed handicap placard forms  5) Urinary retention/urinary incontinence Referred to urology  6) Migraines: -continue topamax 

## 2024-02-05 NOTE — Progress Notes (Signed)
 THERAPIST PROGRESS NOTE  Session Time: 2:01 pm to 2: 53 pm  Type of Therapy: Individual   Therapist Response/Interventions: Active Listening/encourage Anniece to engage in activities that she can so as to distract from her current frustration of having to live there.   Treatment Goals addressed:  Problem: Substance Use     Dates: Start:  11/27/23       Disciplines: Interdisciplinary, PROVIDER        Goal: Christiona will remain abstinent for 30/30 days per her report.     Dates: Start:  11/27/23    Expected End:  05/29/24       Disciplines: Interdisciplinary, PROVIDER             Goal: Otelia will decrease her anxiety and depression by reporting no higher than a 4 on the PH-Q 9 and GAD-7.     Dates: Start:  11/27/23    Expected End:  06/09/24       Disciplines: Interdisciplinary, PROVIDER             Intervention: Therapist will assist Jerney in identifying thoughts and behaviors that can contribute to feelings of depression and anxiety.     Dates: Start:  11/27/23                Intervention: Therapist will educate Taurus about SUDs,, Patterns and consequences, and  of use, and relapse risk and relapse prevention plans     Dates: Start:  11/27/23               Summary: Royale presents in person today with her legal guardian who is her mother.  Mother/guardian says Lilia is now in a different placement. Bryannah says she does not like it and wants to leave. Mother says Vaniah is losing her Medicaid at the end of the month because she makes too much in disability.  Don says she is not sleeping well.  Cinthya's mother says Caleah needs a sleep study and she would have had one at a younger age, but mother could not stay up all night as the sleep lab had wanted because she was working full time.  Terryl's mother says the current facility wants Tatianna to see their psychiatrist and psychologist but Coline does not want to. Mother says if the staff get her set up with their psychiatrist and psychologist, she will call  and cancel Adrielle's appts.  Alexander's mother says it will be so much easier on her if Abbegayle will see the providers that come to the agency.  Moesha's mother says Lianne fell in the shower the other day.     Millianna continues to report no use of any substances.   Progress Towards Goals: some progress  Suicidal/Homicidal: denies  Plan: Return again on Mar 11, 2024 at 2pm  Diagnosis: Polysubstance Use in early remission,  PTSD, Bipolar I  Collaboration of Care: N/A  Patient/Guardian was advised Release of Information must be obtained prior to any record release in order to collaborate their care with an outside provider. Patient/Guardian was advised if they have not already done so to contact the registration department to sign all necessary forms in order for us  to release information regarding their care.   Consent: Patient/Guardian gives verbal consent for treatment and assignment of benefits for services provided during this visit. Patient/Guardian expressed understanding and agreed to proceed.   Darice Simpler, MS.  LMFT, LCAS

## 2024-02-08 LAB — LIPID PANEL
Chol/HDL Ratio: 2.7 ratio (ref 0.0–4.4)
Cholesterol, Total: 138 mg/dL (ref 100–199)
HDL: 52 mg/dL (ref >39)
LDL Chol Calc (NIH): 73 mg/dL (ref 0–99)
Triglycerides: 61 mg/dL (ref 0–149)
VLDL Cholesterol Cal: 13 mg/dL (ref 5–40)

## 2024-02-08 LAB — TSH: TSH: 2.16 u[IU]/mL (ref 0.450–4.500)

## 2024-02-08 LAB — HEMOGLOBIN A1C
Est. average glucose Bld gHb Est-mCnc: 103 mg/dL
Hgb A1c MFr Bld: 5.2 % (ref 4.8–5.6)

## 2024-03-11 ENCOUNTER — Ambulatory Visit (HOSPITAL_COMMUNITY)

## 2024-04-08 ENCOUNTER — Encounter (HOSPITAL_COMMUNITY): Admitting: Psychiatry

## 2024-04-29 ENCOUNTER — Ambulatory Visit: Payer: Self-pay | Admitting: Physical Medicine and Rehabilitation
